# Patient Record
Sex: Male | Born: 1965
Health system: Southern US, Community
[De-identification: ages and names within clinical notes are randomized; demographics above are authoritative.]

## PROBLEM LIST (undated history)

## (undated) DIAGNOSIS — I2699 Other pulmonary embolism without acute cor pulmonale: Secondary | ICD-10-CM

## (undated) DIAGNOSIS — D649 Anemia, unspecified: Secondary | ICD-10-CM

## (undated) DIAGNOSIS — C3490 Malignant neoplasm of unspecified part of unspecified bronchus or lung: Secondary | ICD-10-CM

## (undated) DIAGNOSIS — I509 Heart failure, unspecified: Secondary | ICD-10-CM

## (undated) DIAGNOSIS — C7889 Secondary malignant neoplasm of other digestive organs: Secondary | ICD-10-CM

## (undated) DIAGNOSIS — C801 Malignant (primary) neoplasm, unspecified: Secondary | ICD-10-CM

## (undated) DIAGNOSIS — K922 Gastrointestinal hemorrhage, unspecified: Secondary | ICD-10-CM

## (undated) DIAGNOSIS — I10 Essential (primary) hypertension: Secondary | ICD-10-CM

## (undated) DIAGNOSIS — E119 Type 2 diabetes mellitus without complications: Secondary | ICD-10-CM

## (undated) DIAGNOSIS — F419 Anxiety disorder, unspecified: Secondary | ICD-10-CM

## (undated) DIAGNOSIS — C7951 Secondary malignant neoplasm of bone: Secondary | ICD-10-CM

## (undated) HISTORY — DX: Gastrointestinal hemorrhage, unspecified: K92.2

## (undated) HISTORY — DX: Secondary malignant neoplasm of bone: C79.51

## (undated) HISTORY — PX: COLONOSCOPY W/ POLYPECTOMY: SHX1380

## (undated) HISTORY — PX: NEPHRECTOMY RADICAL: SUR878

## (undated) HISTORY — PX: HAND SURGERY: SHX662

---

## 2001-04-07 ENCOUNTER — Emergency Department (HOSPITAL_COMMUNITY): Admission: EM | Admit: 2001-04-07 | Discharge: 2001-04-07 | Payer: Self-pay | Admitting: *Deleted

## 2001-04-07 ENCOUNTER — Encounter: Payer: Self-pay | Admitting: *Deleted

## 2001-04-08 ENCOUNTER — Emergency Department (HOSPITAL_COMMUNITY): Admission: EM | Admit: 2001-04-08 | Discharge: 2001-04-08 | Payer: Self-pay | Admitting: *Deleted

## 2001-04-09 ENCOUNTER — Ambulatory Visit (HOSPITAL_COMMUNITY): Admission: RE | Admit: 2001-04-09 | Discharge: 2001-04-09 | Payer: Self-pay | Admitting: Orthopaedic Surgery

## 2001-04-10 ENCOUNTER — Encounter (HOSPITAL_COMMUNITY): Admission: RE | Admit: 2001-04-10 | Discharge: 2001-05-10 | Payer: Self-pay | Admitting: Orthopaedic Surgery

## 2001-04-10 ENCOUNTER — Ambulatory Visit (HOSPITAL_COMMUNITY): Admission: RE | Admit: 2001-04-10 | Discharge: 2001-04-10 | Payer: Self-pay | Admitting: Orthopaedic Surgery

## 2001-04-11 ENCOUNTER — Emergency Department (HOSPITAL_COMMUNITY): Admission: EM | Admit: 2001-04-11 | Discharge: 2001-04-11 | Payer: Self-pay | Admitting: Emergency Medicine

## 2001-04-11 ENCOUNTER — Encounter: Payer: Self-pay | Admitting: Emergency Medicine

## 2001-04-11 ENCOUNTER — Ambulatory Visit (HOSPITAL_COMMUNITY): Admission: RE | Admit: 2001-04-11 | Discharge: 2001-04-11 | Payer: Self-pay | Admitting: Orthopaedic Surgery

## 2001-04-13 ENCOUNTER — Ambulatory Visit (HOSPITAL_COMMUNITY): Admission: RE | Admit: 2001-04-13 | Discharge: 2001-04-13 | Payer: Self-pay | Admitting: Orthopaedic Surgery

## 2006-09-24 ENCOUNTER — Encounter: Admission: RE | Admit: 2006-09-24 | Discharge: 2006-11-03 | Payer: Self-pay | Admitting: Clinical

## 2007-12-23 DIAGNOSIS — C3490 Malignant neoplasm of unspecified part of unspecified bronchus or lung: Secondary | ICD-10-CM

## 2007-12-23 HISTORY — DX: Malignant neoplasm of unspecified part of unspecified bronchus or lung: C34.90

## 2008-06-30 ENCOUNTER — Emergency Department (HOSPITAL_COMMUNITY): Admission: EM | Admit: 2008-06-30 | Discharge: 2008-06-30 | Payer: Self-pay | Admitting: Emergency Medicine

## 2008-07-03 ENCOUNTER — Ambulatory Visit (HOSPITAL_COMMUNITY): Admission: RE | Admit: 2008-07-03 | Discharge: 2008-07-03 | Payer: Self-pay | Admitting: Urology

## 2008-08-07 ENCOUNTER — Inpatient Hospital Stay (HOSPITAL_COMMUNITY): Admission: RE | Admit: 2008-08-07 | Discharge: 2008-08-09 | Payer: Self-pay | Admitting: Urology

## 2008-08-07 ENCOUNTER — Encounter (INDEPENDENT_AMBULATORY_CARE_PROVIDER_SITE_OTHER): Payer: Self-pay | Admitting: Urology

## 2008-08-16 ENCOUNTER — Ambulatory Visit: Payer: Self-pay | Admitting: Oncology

## 2008-08-17 ENCOUNTER — Encounter: Payer: Self-pay | Admitting: Internal Medicine

## 2008-08-17 ENCOUNTER — Ambulatory Visit: Payer: Self-pay | Admitting: Thoracic Surgery (Cardiothoracic Vascular Surgery)

## 2008-09-18 ENCOUNTER — Ambulatory Visit: Payer: Self-pay | Admitting: Thoracic Surgery (Cardiothoracic Vascular Surgery)

## 2008-09-18 ENCOUNTER — Encounter
Admission: RE | Admit: 2008-09-18 | Discharge: 2008-09-18 | Payer: Self-pay | Admitting: Thoracic Surgery (Cardiothoracic Vascular Surgery)

## 2008-09-21 HISTORY — PX: LUNG REMOVAL, PARTIAL: SHX233

## 2008-09-26 ENCOUNTER — Encounter: Payer: Self-pay | Admitting: Thoracic Surgery (Cardiothoracic Vascular Surgery)

## 2008-09-26 ENCOUNTER — Inpatient Hospital Stay (HOSPITAL_COMMUNITY)
Admission: RE | Admit: 2008-09-26 | Discharge: 2008-09-30 | Payer: Self-pay | Admitting: Thoracic Surgery (Cardiothoracic Vascular Surgery)

## 2008-09-26 ENCOUNTER — Ambulatory Visit: Payer: Self-pay | Admitting: Thoracic Surgery (Cardiothoracic Vascular Surgery)

## 2008-10-04 ENCOUNTER — Ambulatory Visit: Payer: Self-pay | Admitting: Thoracic Surgery (Cardiothoracic Vascular Surgery)

## 2008-10-06 ENCOUNTER — Ambulatory Visit: Payer: Self-pay | Admitting: Thoracic Surgery (Cardiothoracic Vascular Surgery)

## 2008-10-18 ENCOUNTER — Ambulatory Visit: Payer: Self-pay | Admitting: Oncology

## 2008-10-20 LAB — CBC WITH DIFFERENTIAL/PLATELET
BASO%: 0.8 % (ref 0.0–2.0)
Basophils Absolute: 0.1 10*3/uL (ref 0.0–0.1)
EOS%: 7.8 % — ABNORMAL HIGH (ref 0.0–7.0)
Eosinophils Absolute: 0.9 10*3/uL — ABNORMAL HIGH (ref 0.0–0.5)
HCT: 38.3 % — ABNORMAL LOW (ref 38.7–49.9)
HGB: 13.1 g/dL (ref 13.0–17.1)
LYMPH%: 21.7 % (ref 14.0–48.0)
MCH: 31.4 pg (ref 28.0–33.4)
MCHC: 34.3 g/dL (ref 32.0–35.9)
MCV: 91.5 fL (ref 81.6–98.0)
MONO#: 0.7 10*3/uL (ref 0.1–0.9)
MONO%: 6.6 % (ref 0.0–13.0)
NEUT#: 7.1 10*3/uL — ABNORMAL HIGH (ref 1.5–6.5)
NEUT%: 63.1 % (ref 40.0–75.0)
Platelets: 331 10*3/uL (ref 145–400)
RBC: 4.19 10*6/uL — ABNORMAL LOW (ref 4.20–5.71)
RDW: 14 % (ref 11.2–14.6)
WBC: 11.2 10*3/uL — ABNORMAL HIGH (ref 4.0–10.0)
lymph#: 2.4 10*3/uL (ref 0.9–3.3)

## 2008-10-20 LAB — COMPREHENSIVE METABOLIC PANEL
ALT: 56 U/L — ABNORMAL HIGH (ref 0–53)
AST: 31 U/L (ref 0–37)
Albumin: 4.3 g/dL (ref 3.5–5.2)
Alkaline Phosphatase: 126 U/L — ABNORMAL HIGH (ref 39–117)
BUN: 16 mg/dL (ref 6–23)
CO2: 27 mEq/L (ref 19–32)
Calcium: 9.8 mg/dL (ref 8.4–10.5)
Chloride: 102 mEq/L (ref 96–112)
Creatinine, Ser: 1.59 mg/dL — ABNORMAL HIGH (ref 0.40–1.50)
Glucose, Bld: 76 mg/dL (ref 70–99)
Potassium: 4.5 mEq/L (ref 3.5–5.3)
Sodium: 139 mEq/L (ref 135–145)
Total Bilirubin: 0.5 mg/dL (ref 0.3–1.2)
Total Protein: 8.2 g/dL (ref 6.0–8.3)

## 2008-10-20 LAB — LACTATE DEHYDROGENASE: LDH: 154 U/L (ref 94–250)

## 2008-10-23 ENCOUNTER — Encounter
Admission: RE | Admit: 2008-10-23 | Discharge: 2008-10-23 | Payer: Self-pay | Admitting: Thoracic Surgery (Cardiothoracic Vascular Surgery)

## 2008-10-23 ENCOUNTER — Ambulatory Visit: Payer: Self-pay | Admitting: Thoracic Surgery (Cardiothoracic Vascular Surgery)

## 2008-12-12 ENCOUNTER — Ambulatory Visit: Payer: Self-pay | Admitting: Oncology

## 2008-12-18 ENCOUNTER — Ambulatory Visit (HOSPITAL_COMMUNITY): Admission: RE | Admit: 2008-12-18 | Discharge: 2008-12-18 | Payer: Self-pay | Admitting: Oncology

## 2008-12-25 ENCOUNTER — Ambulatory Visit: Payer: Self-pay | Admitting: Thoracic Surgery (Cardiothoracic Vascular Surgery)

## 2009-05-02 ENCOUNTER — Ambulatory Visit: Payer: Self-pay | Admitting: Oncology

## 2009-05-11 ENCOUNTER — Ambulatory Visit (HOSPITAL_COMMUNITY): Admission: RE | Admit: 2009-05-11 | Discharge: 2009-05-11 | Payer: Self-pay | Admitting: Oncology

## 2009-05-11 LAB — COMPREHENSIVE METABOLIC PANEL
ALT: 37 U/L (ref 0–53)
AST: 24 U/L (ref 0–37)
Albumin: 4.5 g/dL (ref 3.5–5.2)
Alkaline Phosphatase: 109 U/L (ref 39–117)
BUN: 17 mg/dL (ref 6–23)
CO2: 21 mEq/L (ref 19–32)
Calcium: 9.3 mg/dL (ref 8.4–10.5)
Chloride: 106 mEq/L (ref 96–112)
Creatinine, Ser: 1.33 mg/dL (ref 0.40–1.50)
Glucose, Bld: 87 mg/dL (ref 70–99)
Potassium: 4.5 mEq/L (ref 3.5–5.3)
Sodium: 139 mEq/L (ref 135–145)
Total Bilirubin: 0.4 mg/dL (ref 0.3–1.2)
Total Protein: 8.1 g/dL (ref 6.0–8.3)

## 2009-05-11 LAB — CBC WITH DIFFERENTIAL/PLATELET
BASO%: 0.5 % (ref 0.0–2.0)
Basophils Absolute: 0 10*3/uL (ref 0.0–0.1)
EOS%: 2.1 % (ref 0.0–7.0)
Eosinophils Absolute: 0.2 10*3/uL (ref 0.0–0.5)
HCT: 41.9 % (ref 38.4–49.9)
HGB: 14.3 g/dL (ref 13.0–17.1)
LYMPH%: 25.1 % (ref 14.0–49.0)
MCH: 31.5 pg (ref 27.2–33.4)
MCHC: 34.1 g/dL (ref 32.0–36.0)
MCV: 92.2 fL (ref 79.3–98.0)
MONO#: 0.4 10*3/uL (ref 0.1–0.9)
MONO%: 5 % (ref 0.0–14.0)
NEUT#: 5.2 10*3/uL (ref 1.5–6.5)
NEUT%: 67.3 % (ref 39.0–75.0)
Platelets: 232 10*3/uL (ref 140–400)
RBC: 4.54 10*6/uL (ref 4.20–5.82)
RDW: 14 % (ref 11.0–14.6)
WBC: 7.8 10*3/uL (ref 4.0–10.3)
lymph#: 1.9 10*3/uL (ref 0.9–3.3)

## 2009-05-11 LAB — LACTATE DEHYDROGENASE: LDH: 160 U/L (ref 94–250)

## 2009-10-03 ENCOUNTER — Ambulatory Visit: Payer: Self-pay | Admitting: Oncology

## 2009-10-05 LAB — CBC WITH DIFFERENTIAL/PLATELET
BASO%: 0.5 % (ref 0.0–2.0)
Basophils Absolute: 0 10*3/uL (ref 0.0–0.1)
EOS%: 1.8 % (ref 0.0–7.0)
Eosinophils Absolute: 0.1 10*3/uL (ref 0.0–0.5)
HCT: 41.1 % (ref 38.4–49.9)
HGB: 14.2 g/dL (ref 13.0–17.1)
LYMPH%: 25.2 % (ref 14.0–49.0)
MCH: 32.1 pg (ref 27.2–33.4)
MCHC: 34.6 g/dL (ref 32.0–36.0)
MCV: 92.6 fL (ref 79.3–98.0)
MONO#: 0.5 10*3/uL (ref 0.1–0.9)
MONO%: 6.8 % (ref 0.0–14.0)
NEUT#: 5.2 10*3/uL (ref 1.5–6.5)
NEUT%: 65.7 % (ref 39.0–75.0)
Platelets: 224 10*3/uL (ref 140–400)
RBC: 4.44 10*6/uL (ref 4.20–5.82)
RDW: 13.1 % (ref 11.0–14.6)
WBC: 7.9 10*3/uL (ref 4.0–10.3)
lymph#: 2 10*3/uL (ref 0.9–3.3)

## 2009-10-05 LAB — COMPREHENSIVE METABOLIC PANEL
ALT: 41 U/L (ref 0–53)
AST: 28 U/L (ref 0–37)
Albumin: 4.6 g/dL (ref 3.5–5.2)
Alkaline Phosphatase: 93 U/L (ref 39–117)
BUN: 11 mg/dL (ref 6–23)
CO2: 25 mEq/L (ref 19–32)
Calcium: 9 mg/dL (ref 8.4–10.5)
Chloride: 105 mEq/L (ref 96–112)
Creatinine, Ser: 1.23 mg/dL (ref 0.40–1.50)
Glucose, Bld: 113 mg/dL — ABNORMAL HIGH (ref 70–99)
Potassium: 4.4 mEq/L (ref 3.5–5.3)
Sodium: 138 mEq/L (ref 135–145)
Total Bilirubin: 0.5 mg/dL (ref 0.3–1.2)
Total Protein: 7.8 g/dL (ref 6.0–8.3)

## 2010-01-02 ENCOUNTER — Ambulatory Visit: Payer: Self-pay | Admitting: Oncology

## 2010-01-04 ENCOUNTER — Ambulatory Visit (HOSPITAL_COMMUNITY): Admission: RE | Admit: 2010-01-04 | Discharge: 2010-01-04 | Payer: Self-pay | Admitting: Oncology

## 2010-01-04 LAB — COMPREHENSIVE METABOLIC PANEL
ALT: 58 U/L — ABNORMAL HIGH (ref 0–53)
AST: 33 U/L (ref 0–37)
Albumin: 4.4 g/dL (ref 3.5–5.2)
Alkaline Phosphatase: 91 U/L (ref 39–117)
BUN: 16 mg/dL (ref 6–23)
CO2: 28 mEq/L (ref 19–32)
Calcium: 9.4 mg/dL (ref 8.4–10.5)
Chloride: 103 mEq/L (ref 96–112)
Creatinine, Ser: 1.47 mg/dL (ref 0.40–1.50)
Glucose, Bld: 89 mg/dL (ref 70–99)
Potassium: 4.3 mEq/L (ref 3.5–5.3)
Sodium: 136 mEq/L (ref 135–145)
Total Bilirubin: 0.7 mg/dL (ref 0.3–1.2)
Total Protein: 8.4 g/dL — ABNORMAL HIGH (ref 6.0–8.3)

## 2010-01-04 LAB — CBC WITH DIFFERENTIAL/PLATELET
BASO%: 0.8 % (ref 0.0–2.0)
Basophils Absolute: 0.1 10*3/uL (ref 0.0–0.1)
EOS%: 2.6 % (ref 0.0–7.0)
Eosinophils Absolute: 0.2 10*3/uL (ref 0.0–0.5)
HCT: 45 % (ref 38.4–49.9)
HGB: 15 g/dL (ref 13.0–17.1)
LYMPH%: 28 % (ref 14.0–49.0)
MCH: 31.5 pg (ref 27.2–33.4)
MCHC: 33.3 g/dL (ref 32.0–36.0)
MCV: 94.5 fL (ref 79.3–98.0)
MONO#: 0.6 10*3/uL (ref 0.1–0.9)
MONO%: 6.5 % (ref 0.0–14.0)
NEUT#: 5.2 10*3/uL (ref 1.5–6.5)
NEUT%: 62.1 % (ref 39.0–75.0)
Platelets: 240 10*3/uL (ref 140–400)
RBC: 4.76 10*6/uL (ref 4.20–5.82)
RDW: 13.1 % (ref 11.0–14.6)
WBC: 8.4 10*3/uL (ref 4.0–10.3)
lymph#: 2.4 10*3/uL (ref 0.9–3.3)

## 2010-01-04 LAB — LACTATE DEHYDROGENASE: LDH: 133 U/L (ref 94–250)

## 2010-03-29 ENCOUNTER — Ambulatory Visit: Payer: Self-pay | Admitting: Oncology

## 2010-03-29 ENCOUNTER — Ambulatory Visit (HOSPITAL_COMMUNITY): Admission: RE | Admit: 2010-03-29 | Discharge: 2010-03-29 | Payer: Self-pay | Admitting: Oncology

## 2010-03-29 LAB — CBC WITH DIFFERENTIAL/PLATELET
BASO%: 0.3 % (ref 0.0–2.0)
Basophils Absolute: 0 10*3/uL (ref 0.0–0.1)
EOS%: 2.6 % (ref 0.0–7.0)
Eosinophils Absolute: 0.2 10*3/uL (ref 0.0–0.5)
HCT: 44.4 % (ref 38.4–49.9)
HGB: 15.4 g/dL (ref 13.0–17.1)
LYMPH%: 26.8 % (ref 14.0–49.0)
MCH: 32.6 pg (ref 27.2–33.4)
MCHC: 34.8 g/dL (ref 32.0–36.0)
MCV: 93.9 fL (ref 79.3–98.0)
MONO#: 0.6 10*3/uL (ref 0.1–0.9)
MONO%: 7.1 % (ref 0.0–14.0)
NEUT#: 5.2 10*3/uL (ref 1.5–6.5)
NEUT%: 63.2 % (ref 39.0–75.0)
Platelets: 227 10*3/uL (ref 140–400)
RBC: 4.73 10*6/uL (ref 4.20–5.82)
RDW: 13.2 % (ref 11.0–14.6)
WBC: 8.2 10*3/uL (ref 4.0–10.3)
lymph#: 2.2 10*3/uL (ref 0.9–3.3)

## 2010-03-29 LAB — COMPREHENSIVE METABOLIC PANEL
ALT: 73 U/L — ABNORMAL HIGH (ref 0–53)
AST: 42 U/L — ABNORMAL HIGH (ref 0–37)
Albumin: 4.2 g/dL (ref 3.5–5.2)
Alkaline Phosphatase: 96 U/L (ref 39–117)
BUN: 15 mg/dL (ref 6–23)
CO2: 29 mEq/L (ref 19–32)
Calcium: 9.5 mg/dL (ref 8.4–10.5)
Chloride: 103 mEq/L (ref 96–112)
Creatinine, Ser: 1.37 mg/dL (ref 0.40–1.50)
Glucose, Bld: 88 mg/dL (ref 70–99)
Potassium: 4.3 mEq/L (ref 3.5–5.3)
Sodium: 137 mEq/L (ref 135–145)
Total Bilirubin: 0.8 mg/dL (ref 0.3–1.2)
Total Protein: 8.3 g/dL (ref 6.0–8.3)

## 2010-03-29 LAB — LACTATE DEHYDROGENASE: LDH: 137 U/L (ref 94–250)

## 2010-04-16 ENCOUNTER — Ambulatory Visit: Admission: RE | Admit: 2010-04-16 | Discharge: 2010-04-20 | Payer: Self-pay | Admitting: Radiation Oncology

## 2010-05-01 ENCOUNTER — Ambulatory Visit: Admission: RE | Admit: 2010-05-01 | Discharge: 2010-05-21 | Payer: Self-pay | Admitting: Radiation Oncology

## 2010-05-04 IMAGING — CT CT ABDOMEN W/O CM
2 of 4 series · 17 of 46 positions shown, 19 images · non-contrast
Comparison: Available

CT ABDOMEN

Addendum Begins

Findings conveyed to Dr. Piet Hein Damms on 06/30/2008 at to 2025
hours
Addendum Ends
CLINICAL DATA: Blood urine and stool
CT ABDOMEN AND PELVIS WITHOUT CONTRAST
TECHNIQUE: Multidetector CT imaging of the abdomen and pelvis was
performed following the standard
protocol without intravenous contrast.

[Series 2: 220 stone 5.0 b40f st · axial · 0.74mm/px · z∈[-516,-136]mm · 14 of 84 slices shown, 16 images]
[im 4/84  soft-tissue]
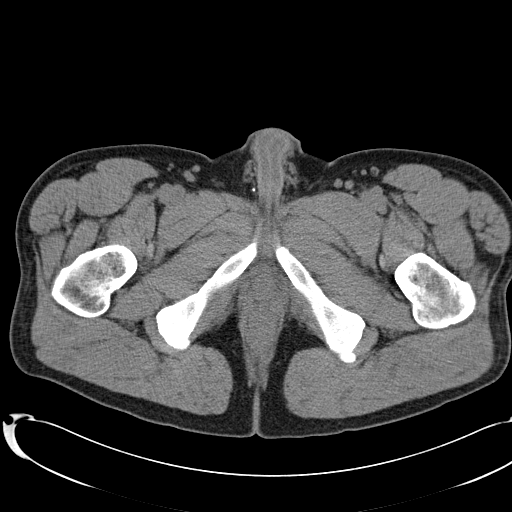
[im 4/84  bone]
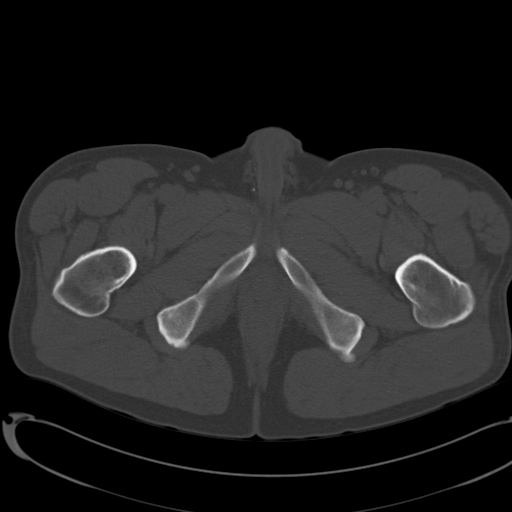
[im 10/84  soft-tissue]
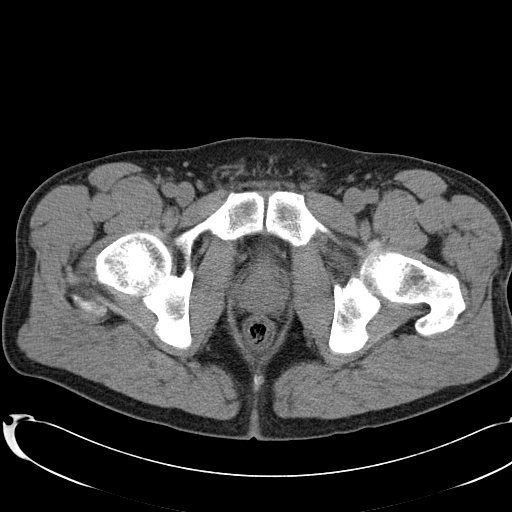
[im 16/84  soft-tissue]
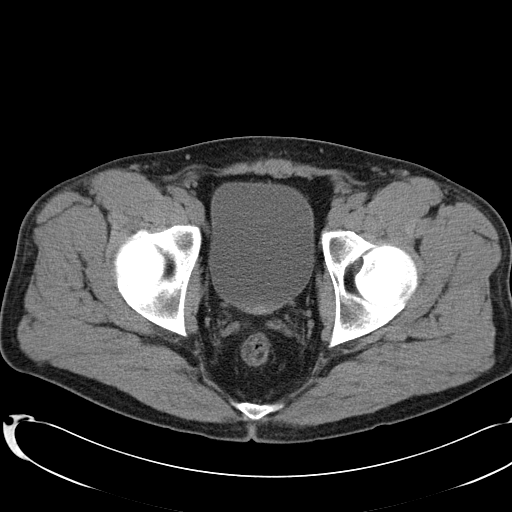
[im 23/84  soft-tissue]
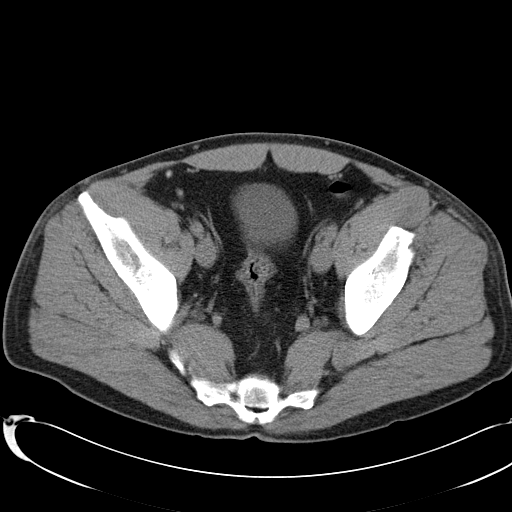
[im 29/84  soft-tissue]
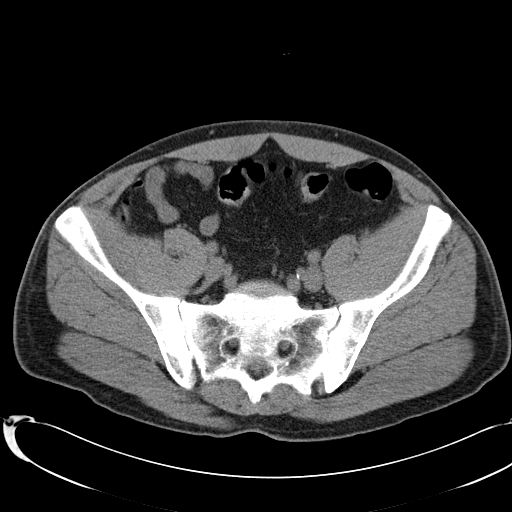
[im 32/84  soft-tissue]
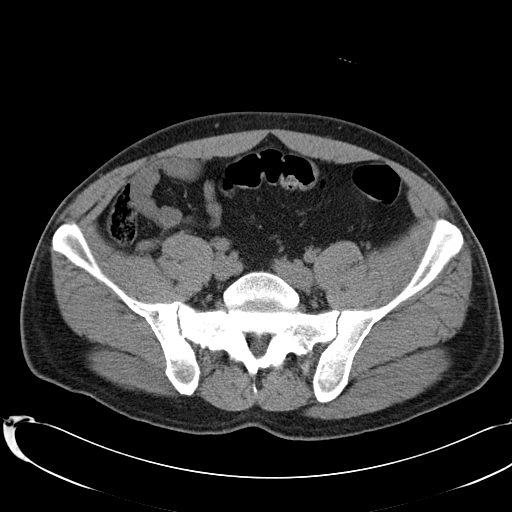
[im 39/84  soft-tissue]
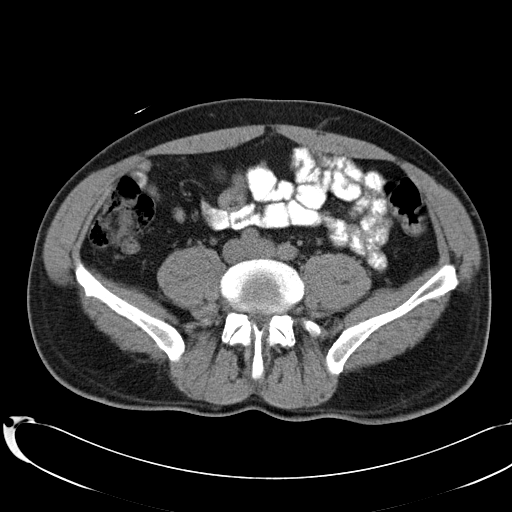
[im 45/84  soft-tissue]
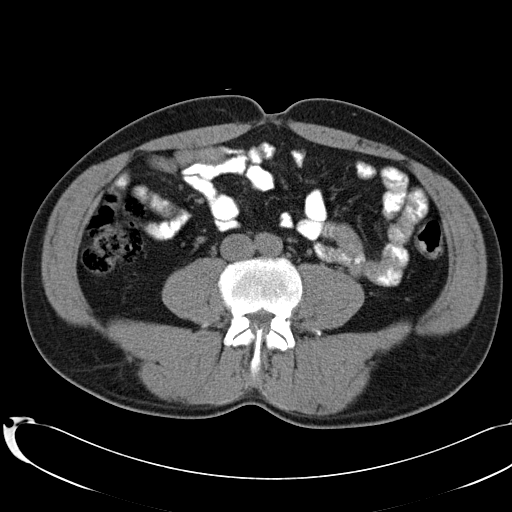
[im 52/84  soft-tissue]
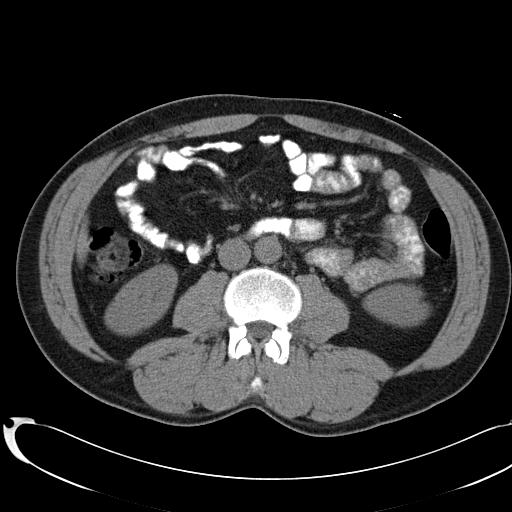
[im 52/84  bone]
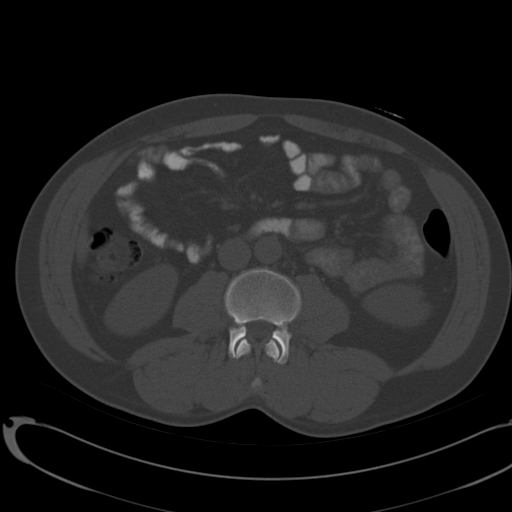
[im 55/84  soft-tissue]
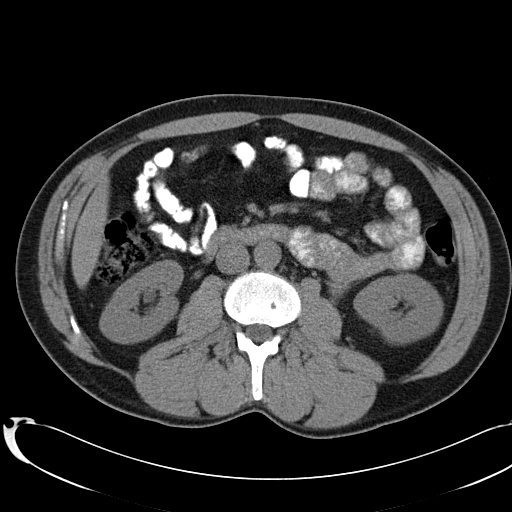
[im 61/84  soft-tissue]
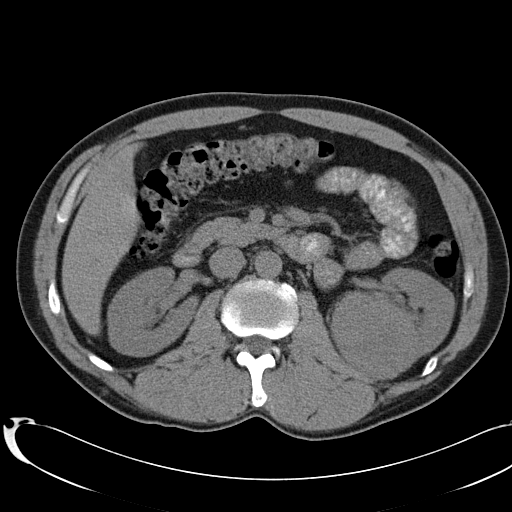
[im 68/84  soft-tissue]
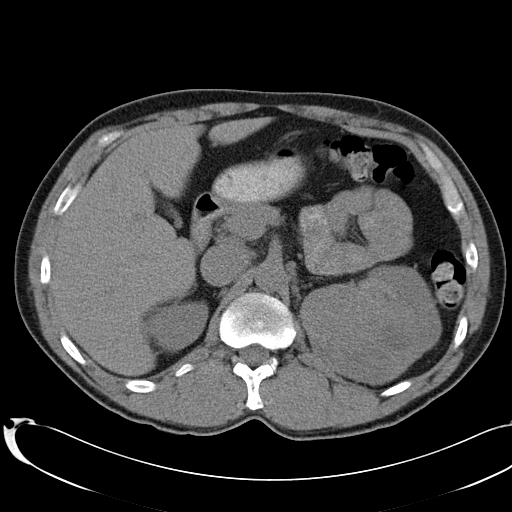
[im 74/84  soft-tissue]
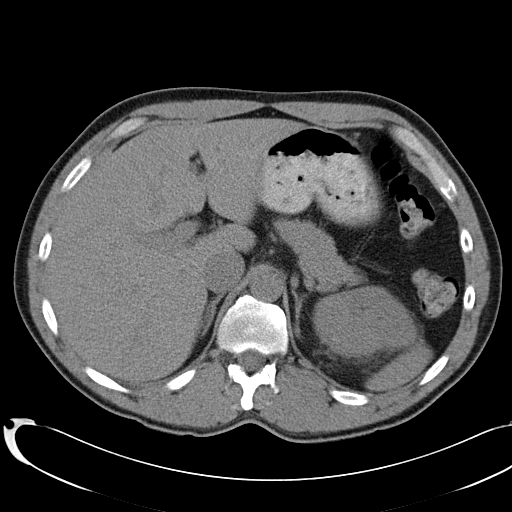
[im 80/84  soft-tissue]
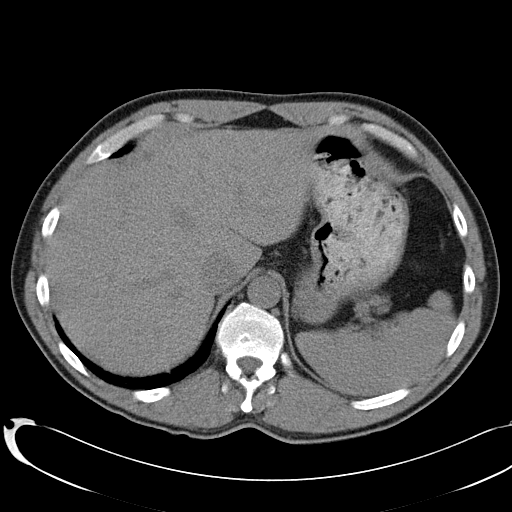

[Series 602: <mpr thick range> · coronal · 0.85mm/px · 3 of 79 slices shown]
[im 27/79  soft-tissue]
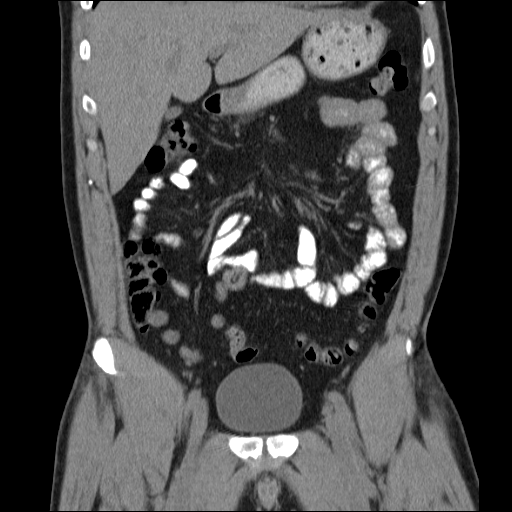
[im 35/79  soft-tissue]
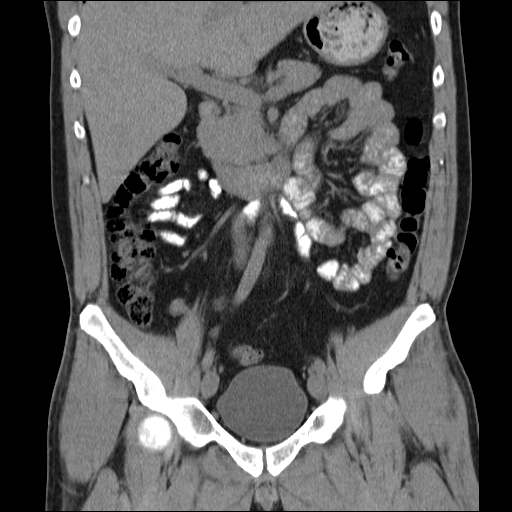
[im 44/79  soft-tissue]
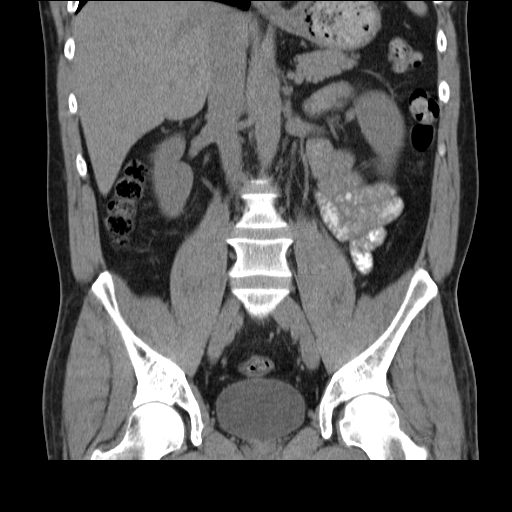

[17 of 46 positions shown; findings below may reference images not displayed]

FINDINGS: Mild bibasilar atelectasis.  Noncontrast images of the
abdomen demonstrate no focal hepatic lesion.  The gallbladder is
collapsed.  The pancreas appears normal.  The spleen is partially
imaged but appears normal.  The adrenal glands are normal.  The
right kidney is normal.

There is a large soft tissue mass extending from the upper pole and
mid pole of the left kidney which measures 9.4 x 6.2 x 10.0 cm.
Lesion highly suspicious for renal cell carcinoma.  This mass is
does not appear to extend beyond to the renal capsule.  Although it
does obliterate the posterior pararenal space due to its size.
Vascular involvement cannot be readily assessed.

Abdominal aorta is normal in caliber.  No evidence of
retroperitoneal or periportal lymphadenopathy. The stomach, small
bowel, and colon appear normal.  The appendix is not visualized.
IMPRESSION: 1. Large 10 cm mass extending from the mid pole of the left  kidney
as described above is highly suspicious for renal cell carcinoma.
Recommend urology consult.

CT PELVIS
FINDINGS: The bladder, seminal vesicles, and prostate appear
normal.  The rectum and sigmoid colon appear normal.  No evidence
of pelvic lymphadenopathy.
IMPRESSION: 1..  No acute abdominal process.

  Please see above for large left renal mass.

## 2010-05-07 IMAGING — CR DG CHEST 2V
3 series · 3 of 3 positions shown · non-contrast
Comparison: CT abdomen and pelvis 06/30/2008

CLINICAL DATA: Right lower lobe mass.

CHEST - 2 VIEW

[w chest pa]
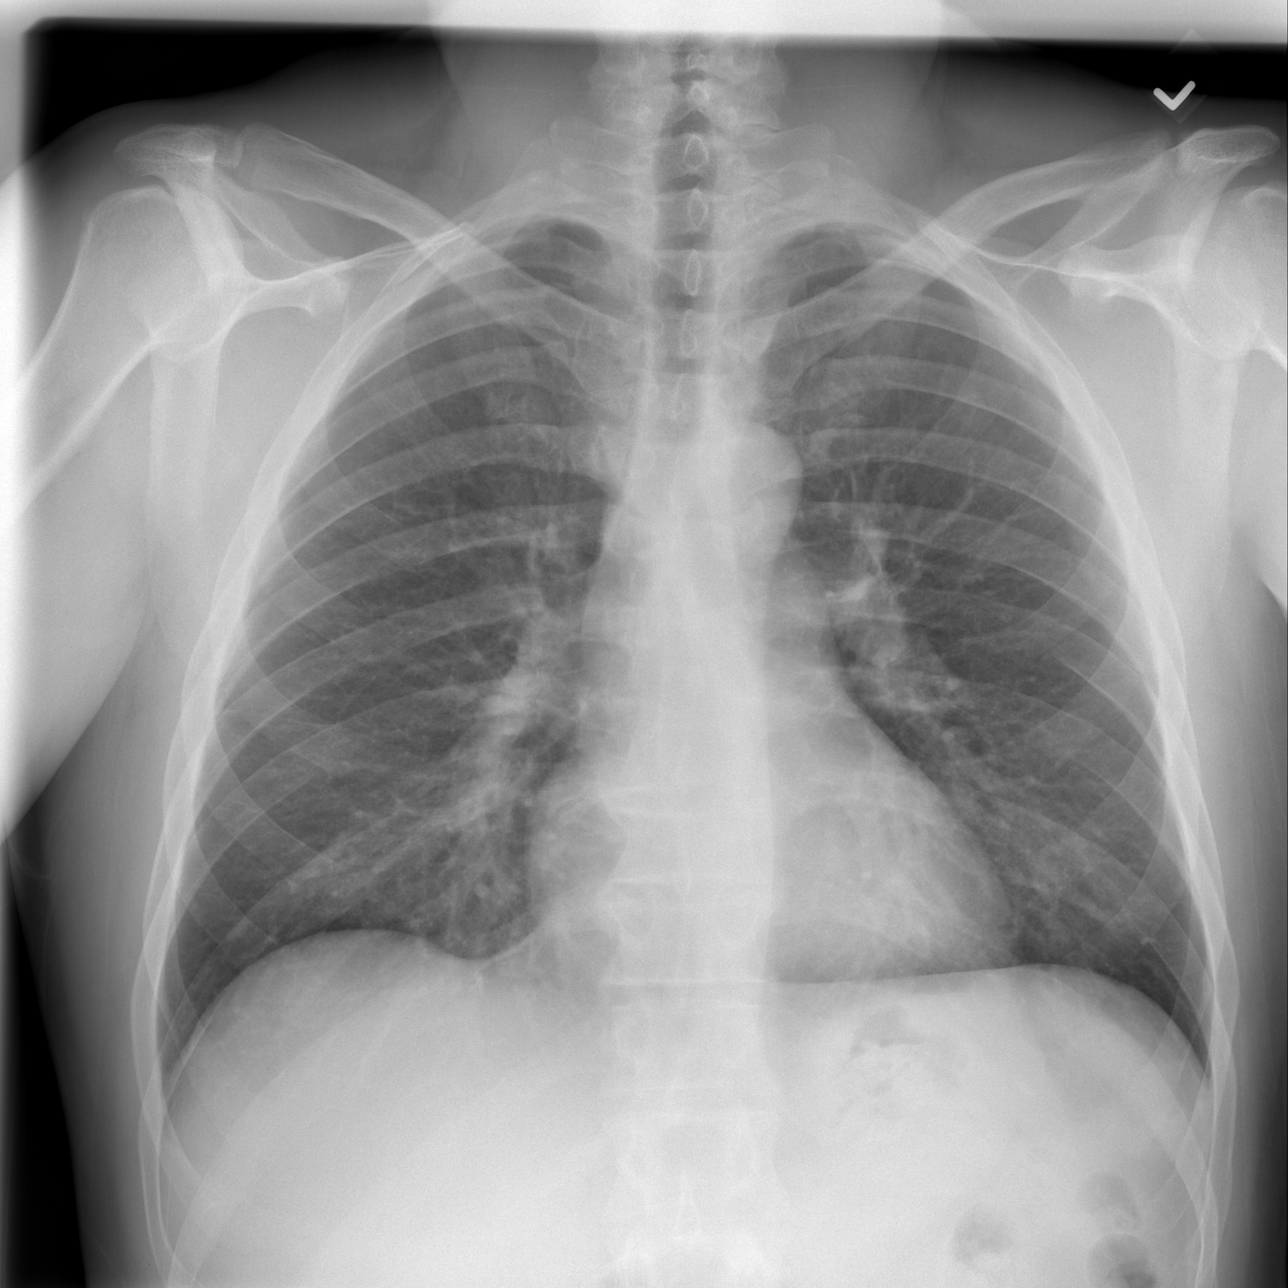

[w chest lat (1 of 2)]
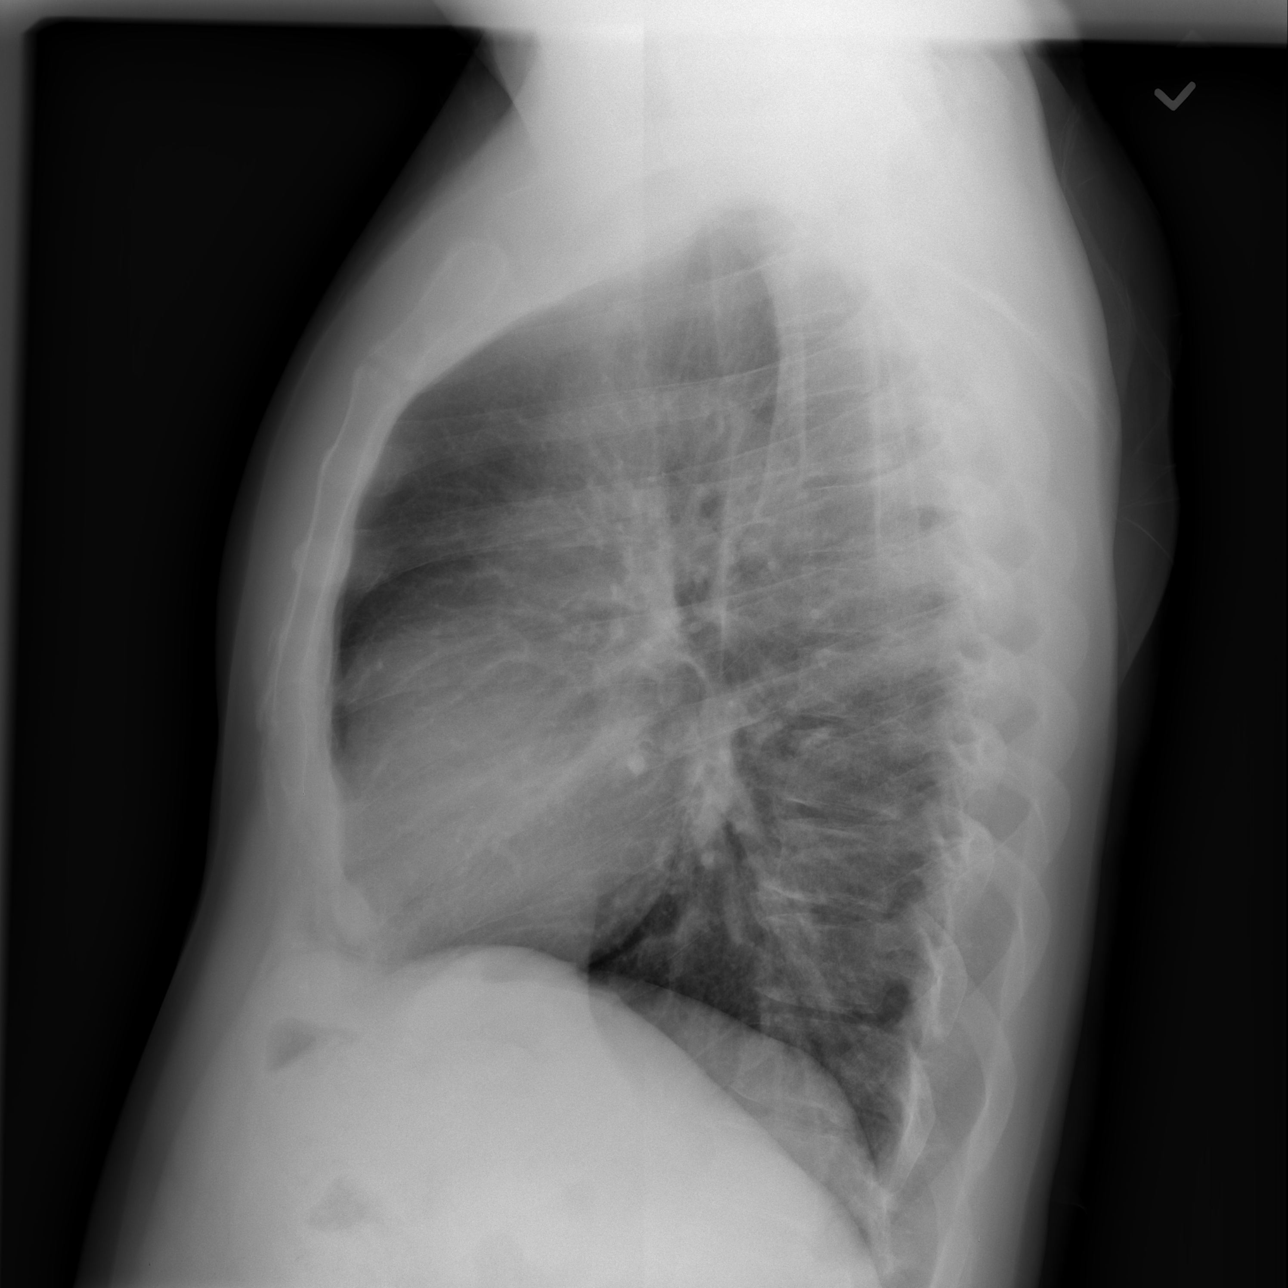

[w chest lat (2 of 2)]
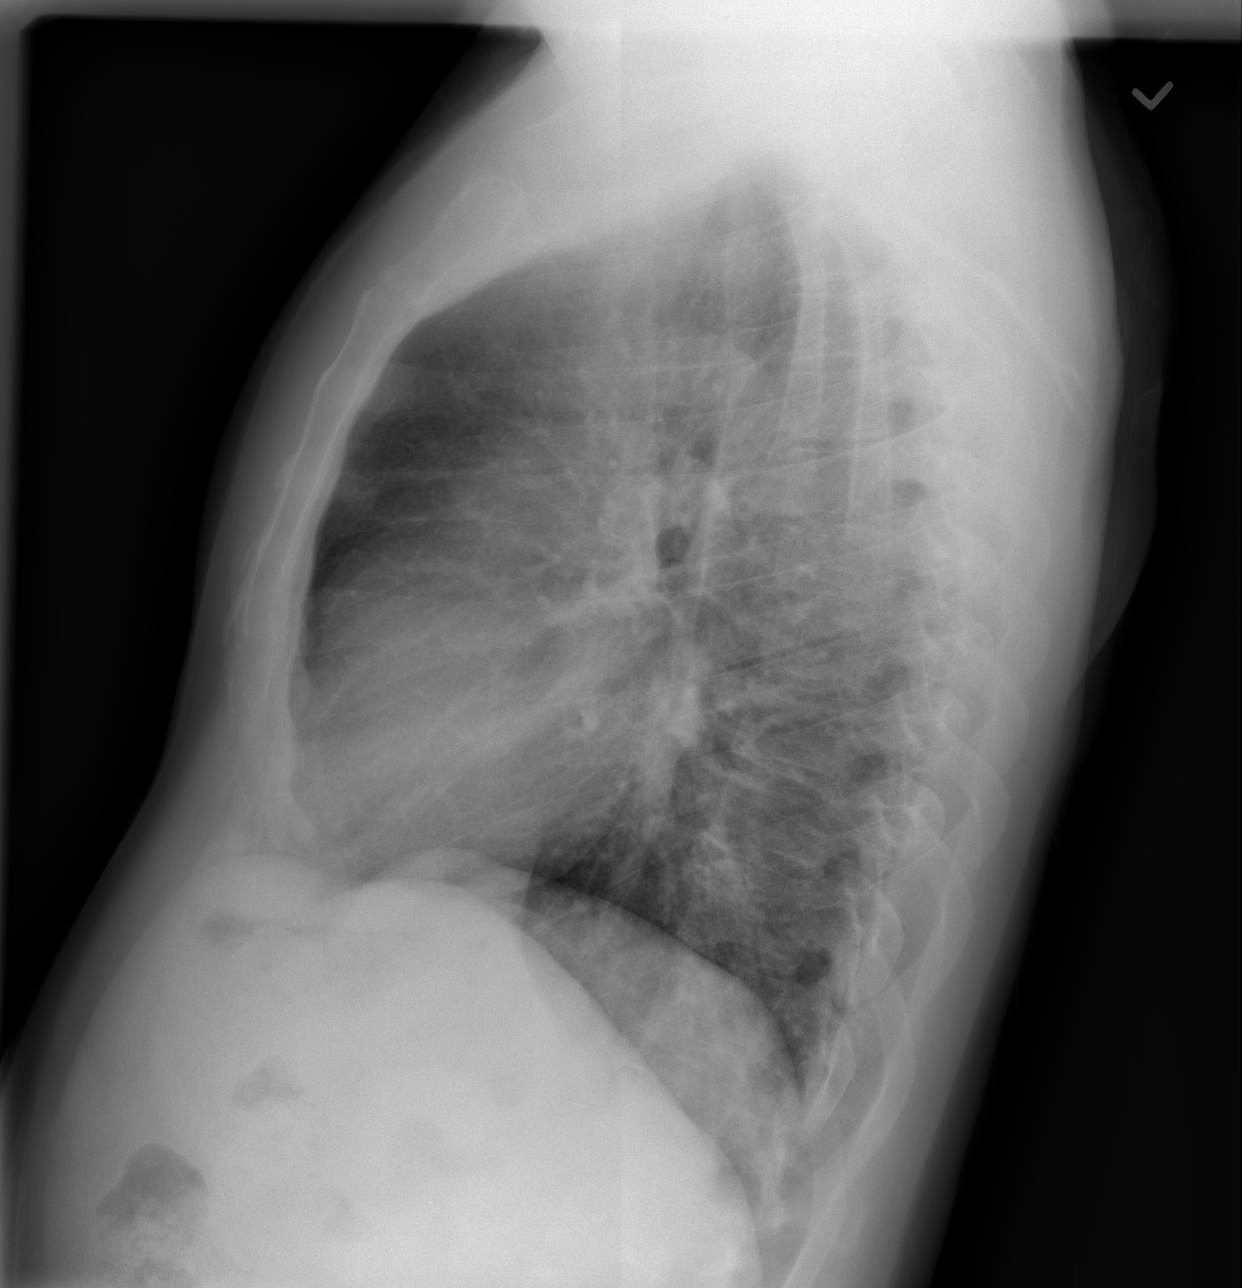

[3 of 3 positions shown; findings below may reference images not displayed]

FINDINGS: The cardiopericardial silhouette mediastinal contours
appear within normal limits.  The trachea is midline.  On the
frontal view, there is no definite right lower lobe mass
identified, as stated in the clinical indication.  On the lateral
view, increased density projected over the lower thoracic spine,
which could represent a lower lobe mass.  In this patient with a
large left renal mass, chest CT is recommended to assess for
pulmonary metastasis.
IMPRESSION: 1.  Area over the thoracic spine on the lateral view suspicious for
pulmonary mass lesion.  This was not seen on the prior abdominal CT
due to field of view and chest CT is recommended to assess for
pulmonary metastatic disease.  This should be performed with
infusion, if there are no contraindications.

## 2010-07-08 ENCOUNTER — Ambulatory Visit (HOSPITAL_COMMUNITY): Admission: RE | Admit: 2010-07-08 | Discharge: 2010-07-08 | Payer: Self-pay | Admitting: Radiation Oncology

## 2010-07-23 IMAGING — CT CT ABDOMEN W/ CM
4 of 5 series · 14 of 46 positions shown, 19 images · IV contrast (READICAT/WATER & [ID] OMNI 300)
Comparison: 06/30/2008

CT CHEST

CLINICAL DATA: Follow-up pulmonary nodules.

CT CHEST AND ABDOMEN WITH CONTRAST
TECHNIQUE: Multidetector CT imaging of the chest and abdomen was
performed following the standard protocol during bolus
administration of intravenous contrast.
Contrast: 125 ml Kmnipaque-IWW

[Series 3: chest/abd/pelvis · axial · 0.79mm/px · z∈[-451,+9]mm · 8 of 106 slices shown]
[im 7/106  soft-tissue]
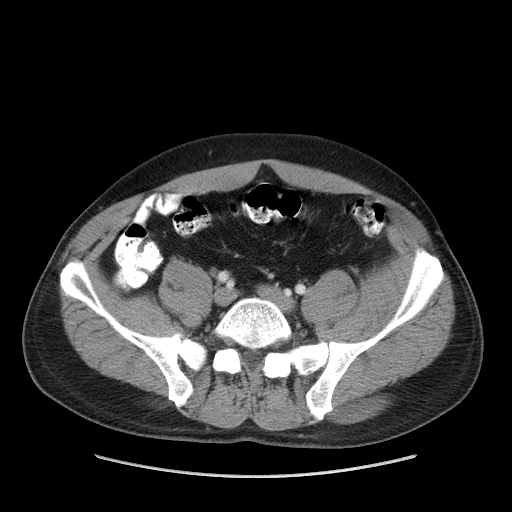
[im 20/106  soft-tissue]
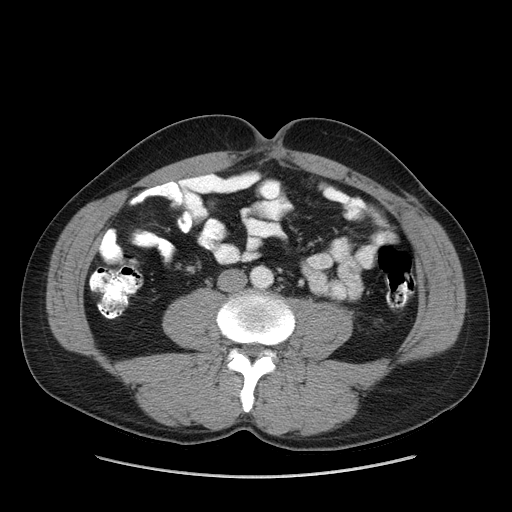
[im 33/106  soft-tissue]
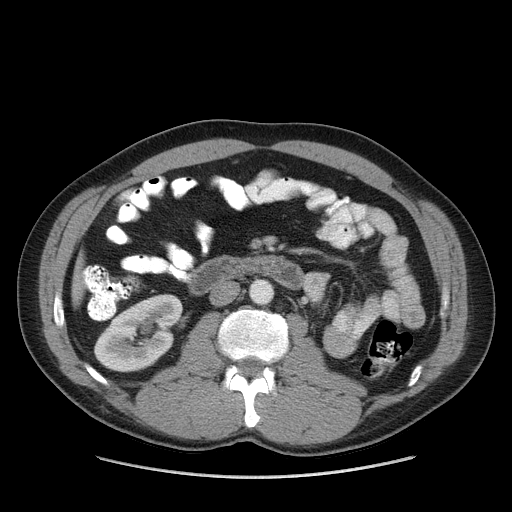
[im 46/106  soft-tissue]
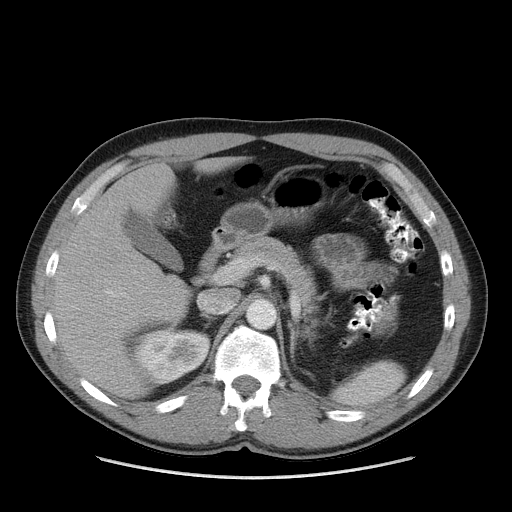
[im 60/106  soft-tissue]
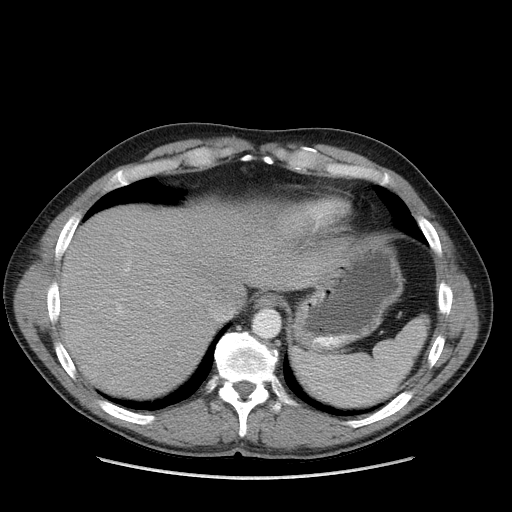
[im 73/106  soft-tissue]
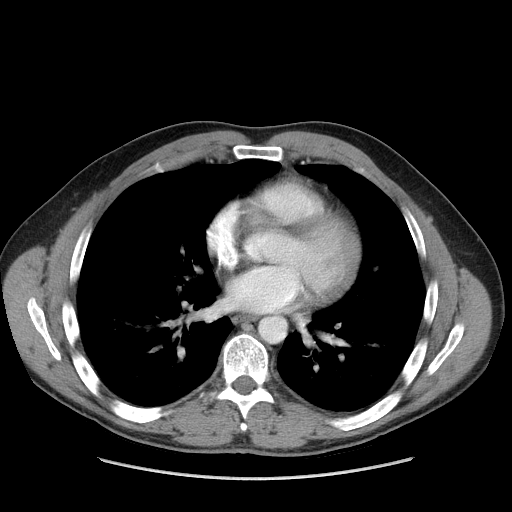
[im 86/106  soft-tissue]
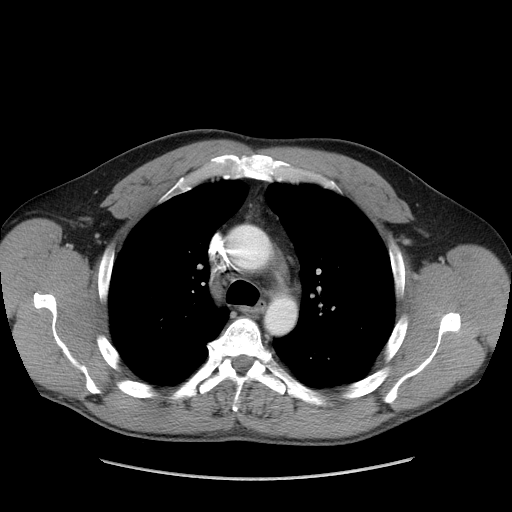
[im 99/106  soft-tissue]
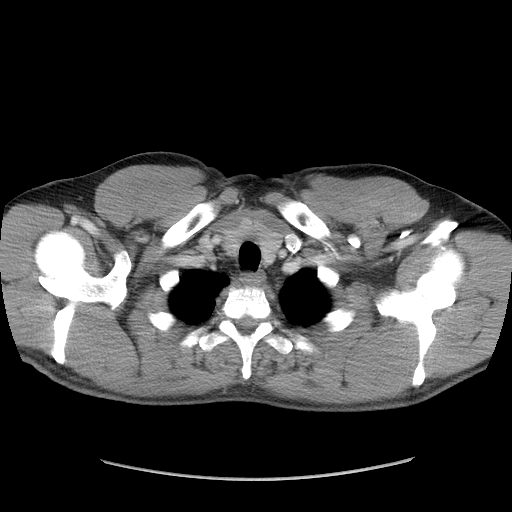

[Series 5: renal delay · axial · delayed · 0.78mm/px · z∈[-324,-279]mm · 2 of 28 slices shown, 5 images]
[im 10/28  soft-tissue]
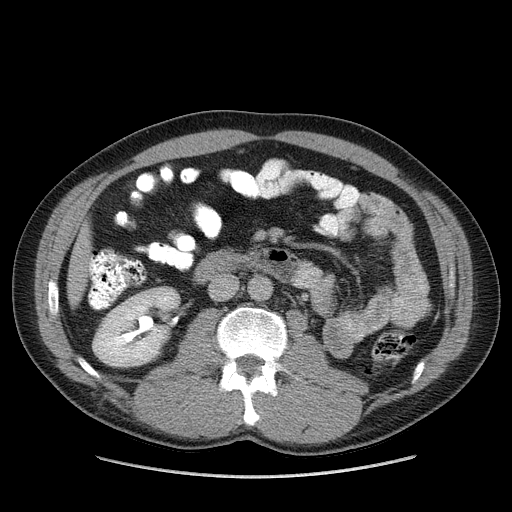
[im 10/28  lung]
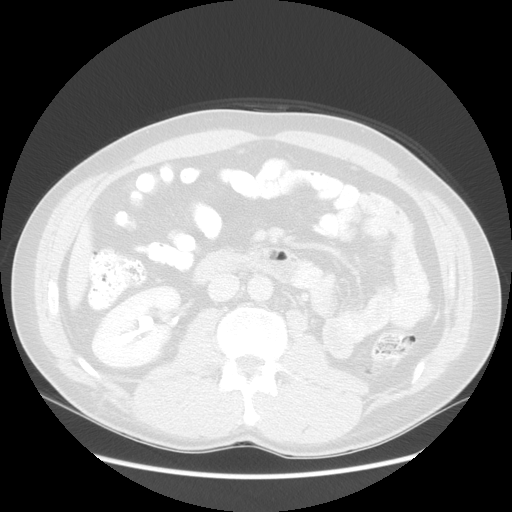
[im 10/28  bone]
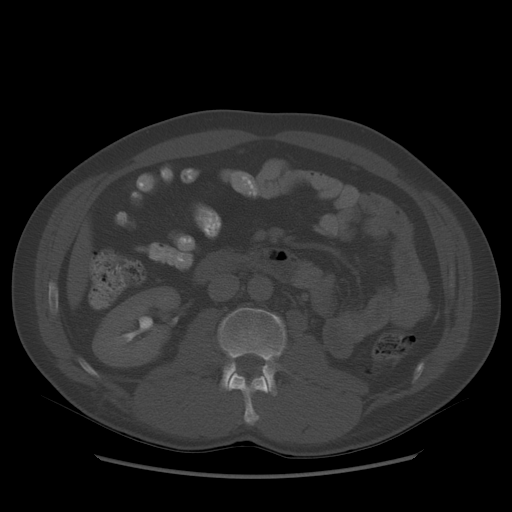
[im 19/28  soft-tissue]
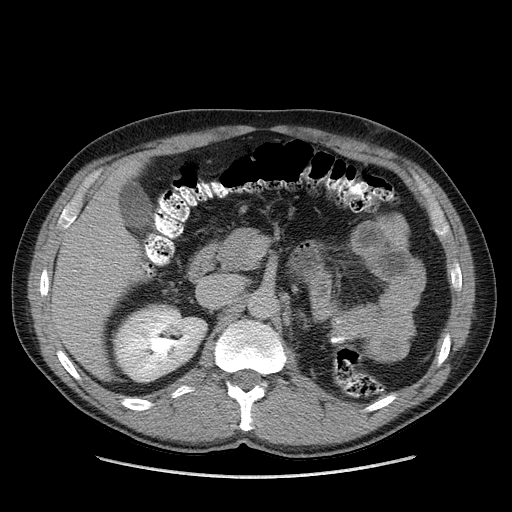
[im 19/28  lung]
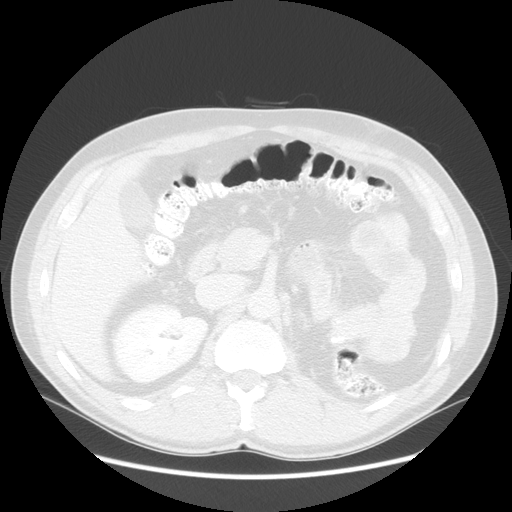

[Series 601: coronal body · coronal · 1.03mm/px · 1 of 114 slices shown, 2 images]
[im 38/114  soft-tissue]
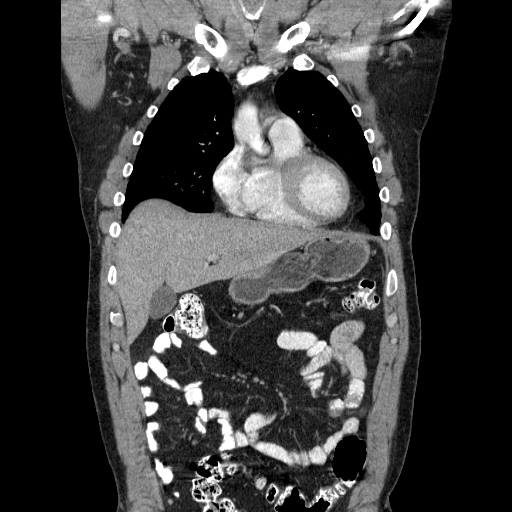
[im 38/114  bone]
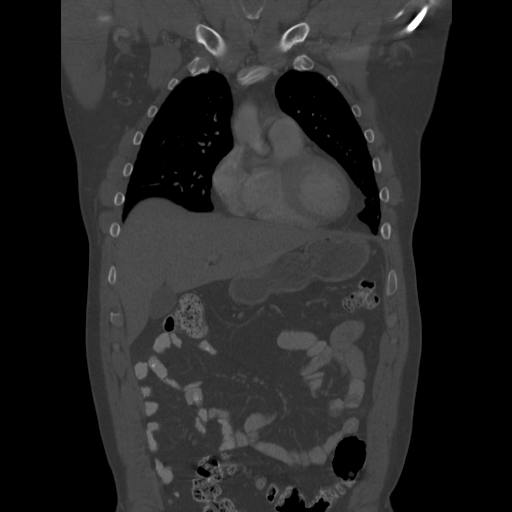

[Series 602: sagittal body · sagittal · 1.03mm/px · 3 of 164 slices shown, 4 images]
[im 55/164  soft-tissue]
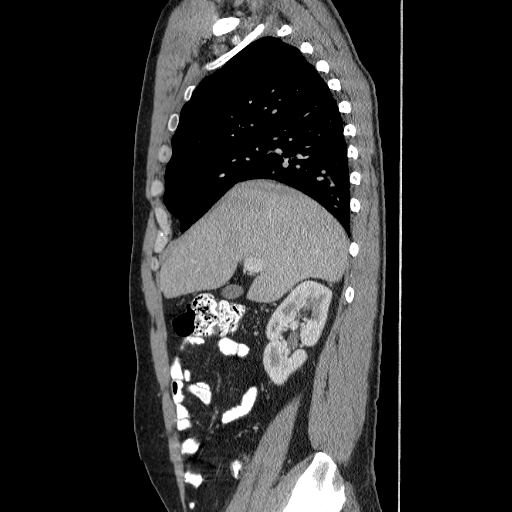
[im 73/164  soft-tissue]
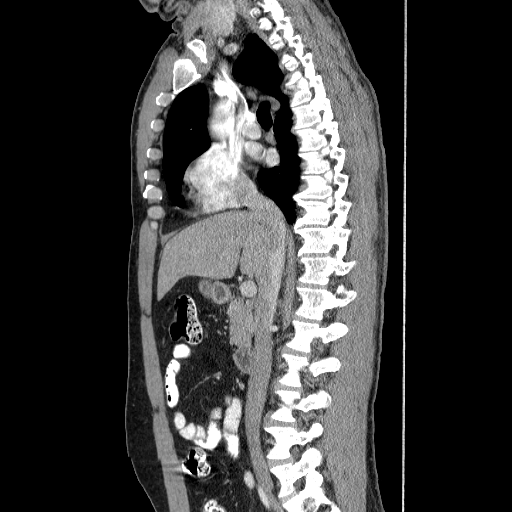
[im 73/164  bone]
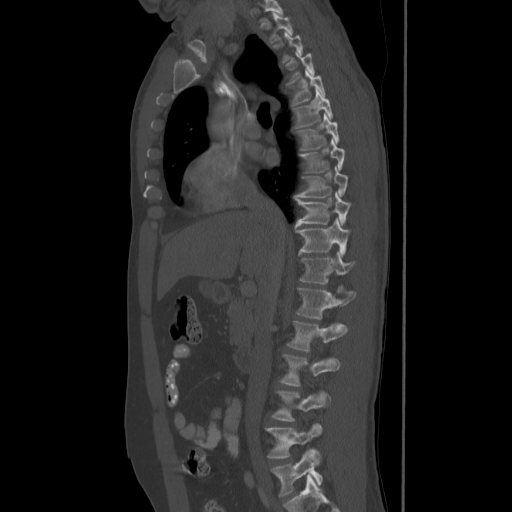
[im 91/164  soft-tissue]
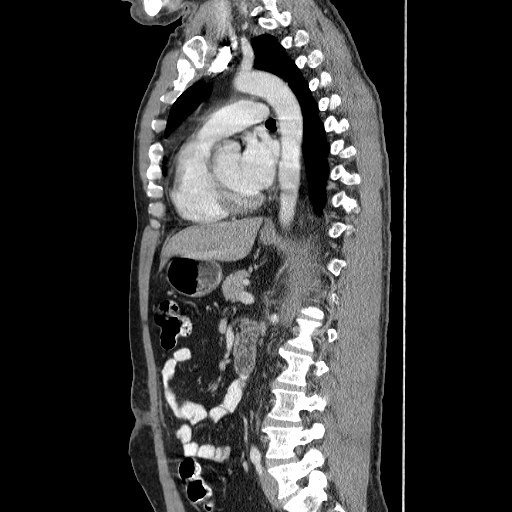

[14 of 46 positions shown; findings below may reference images not displayed]

FINDINGS: There is no mediastinal or hilar mass or adenopathy.  No
pleural or pericardial fluid.

There is pulmonary emphysema evident at the apices.  The left lung
does not show any mass lesions.  In the right lower lobe on image
39, there is a 12 mm nodule.  This could be a metastatic deposit.
Differential diagnosis does include venous varix, but I am
suspicious that this represents a pulmonary mass lesion.  I do not
see any second lesions on the right.  No axillary adenopathy.  No
lytic bone lesions.
IMPRESSION: 12 mm nodule right lower lobe.  Metastatic disease is possible.
This level was not included in the region imaged in [REDACTED].

CT ABDOMEN
FINDINGS: There are several tiny low densities scattered about the
liver, none larger than 2-3 mm in size.  Either likely to represent
cysts, but they are not completely evaluated.  Small metastases
would seem unlikely to be visible at this size.  There may be a
small gallstone dependent in the gallbladder, but this is not
definite.  The spleen is normal.  The pancreas is normal.  The
adrenal glands are normal.  There has been left nephrectomy.  The
right kidney appears normal.  I do not see any enlarged
retroperitoneal lymph nodes.  The aorta and IVC are unremarkable.
No free fluid or air.  No acute bowel pathology is evident.
IMPRESSION: No worrisome finding in the abdomen.  Left nephrectomy.  See above
for full discussion.

## 2010-07-24 ENCOUNTER — Ambulatory Visit: Payer: Self-pay | Admitting: Oncology

## 2010-07-30 IMAGING — CR DG CHEST 2V
2 series · 2 of 2 positions shown · non-contrast
Comparison: 09/18/2008

CLINICAL DATA: Lung lesions preadmission

CHEST - 2 VIEW

[view not recorded (1 of 2)]
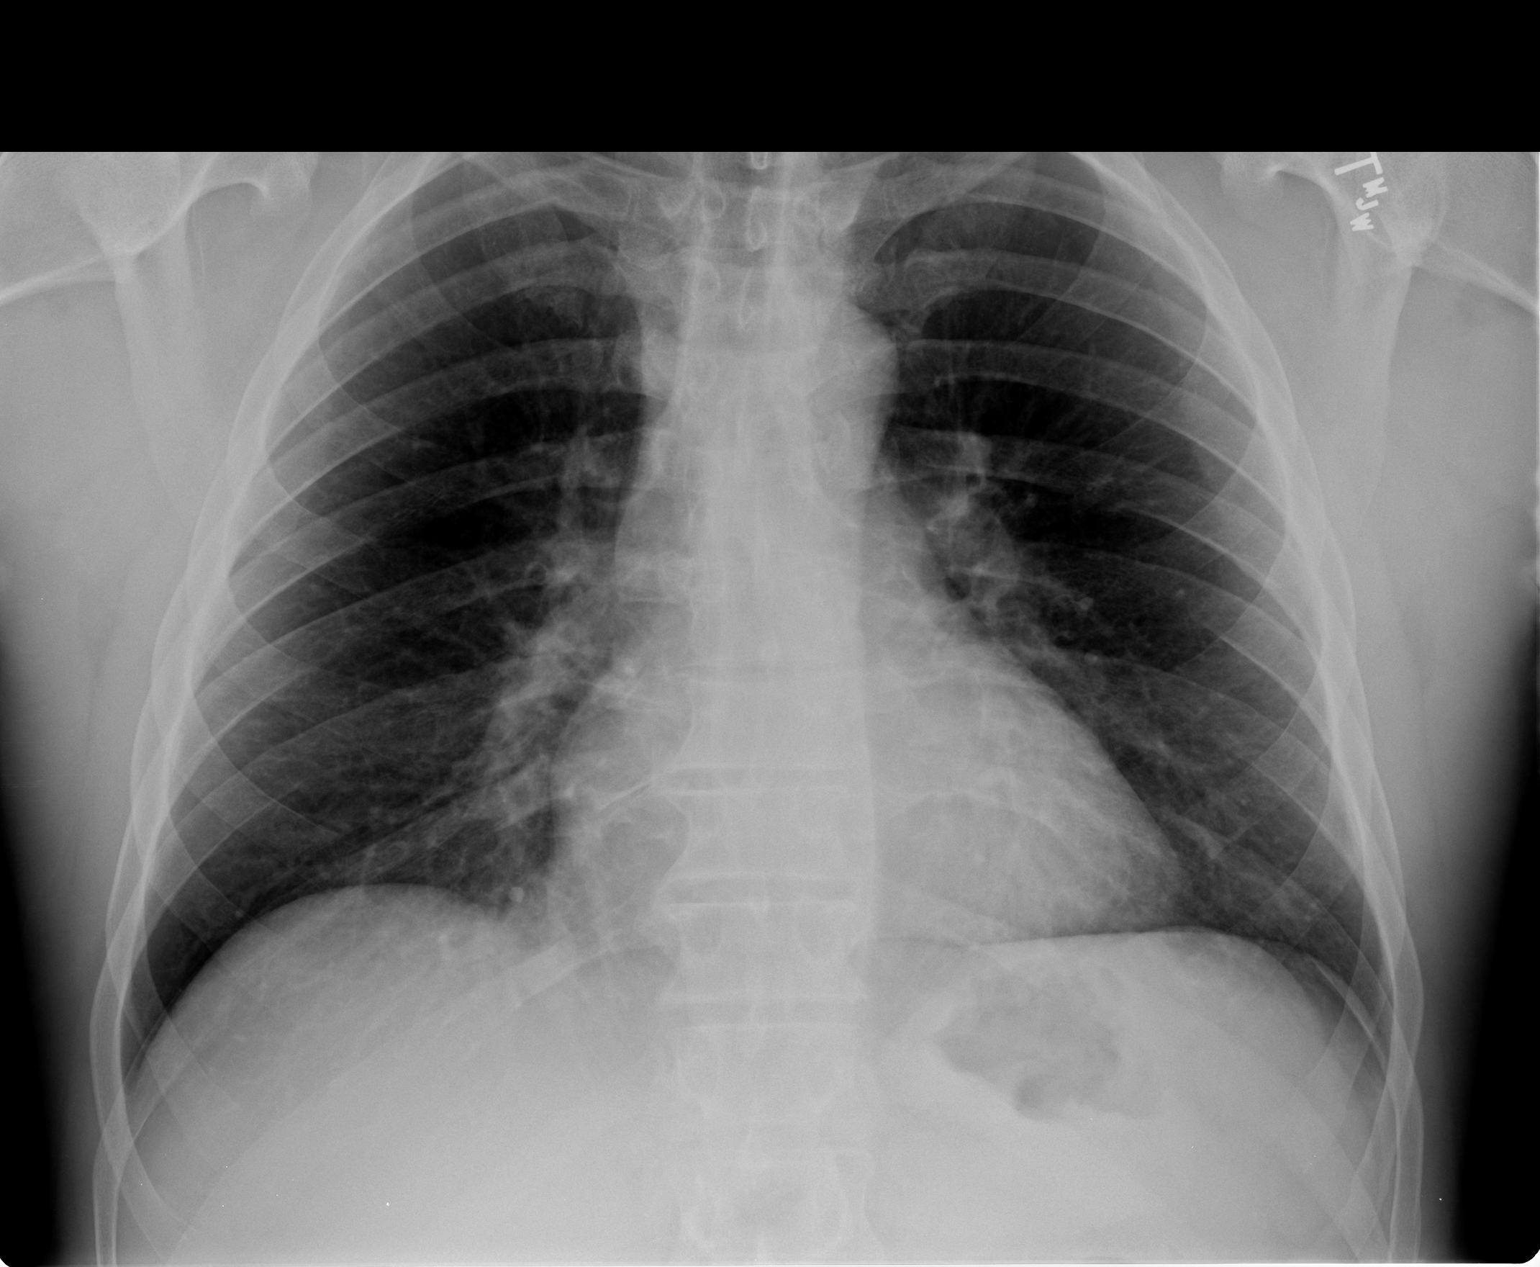

[view not recorded (2 of 2)]
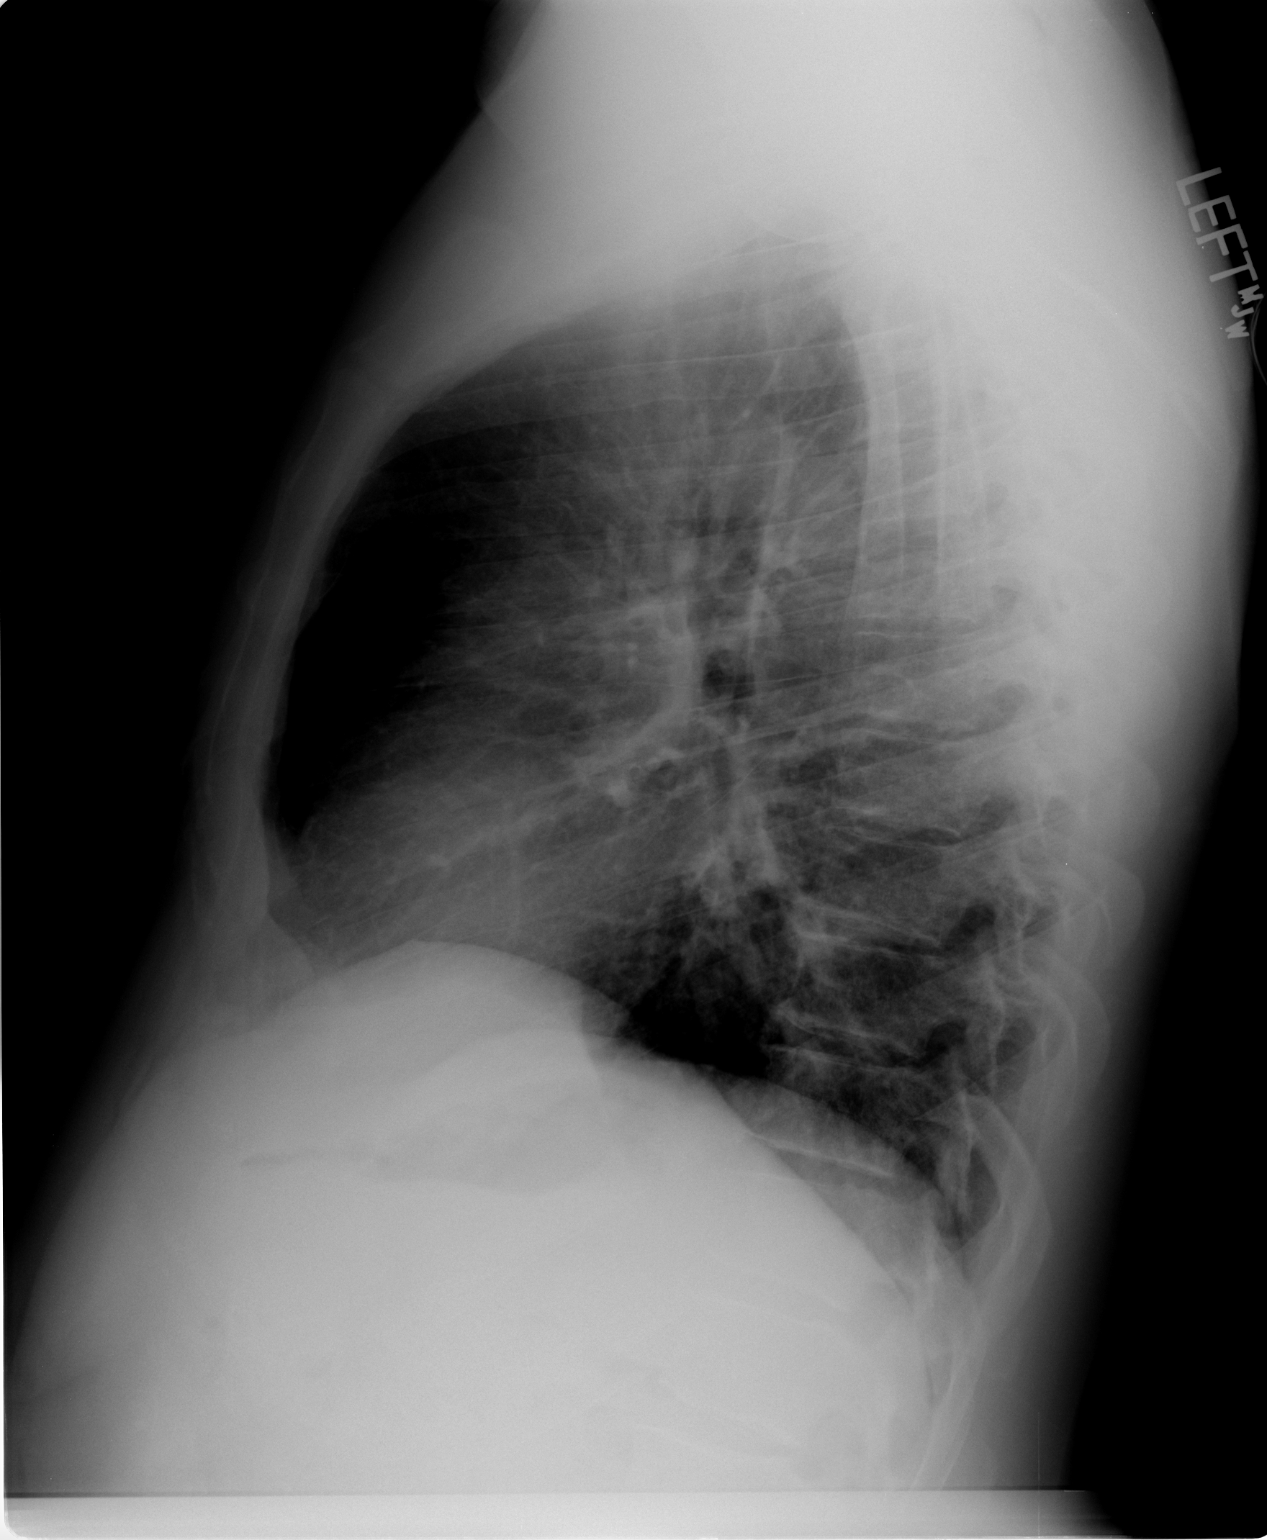

[2 of 2 positions shown; findings below may reference images not displayed]

FINDINGS: The cardiomediastinal silhouette is unremarkable.  No
acute infiltrate or pleural effusion.  No pulmonary edema.  Mild
degenerative changes thoracic spine.  Right basilar nodule is
poorly visualized on lateral view measures about 1.2 cm.
IMPRESSION: No acute infiltrate or edema.
Right basilar nodule is poorly visualized on lateral view measures
about 1.2 cm.

## 2010-08-01 IMAGING — CR DG CHEST 1V PORT
1 series · 1 of 1 positions shown · non-contrast
Comparison: 09/26/2008

CLINICAL DATA: Lung lesion.  Chest tube.

PORTABLE CHEST - 1 VIEW

[view not recorded]
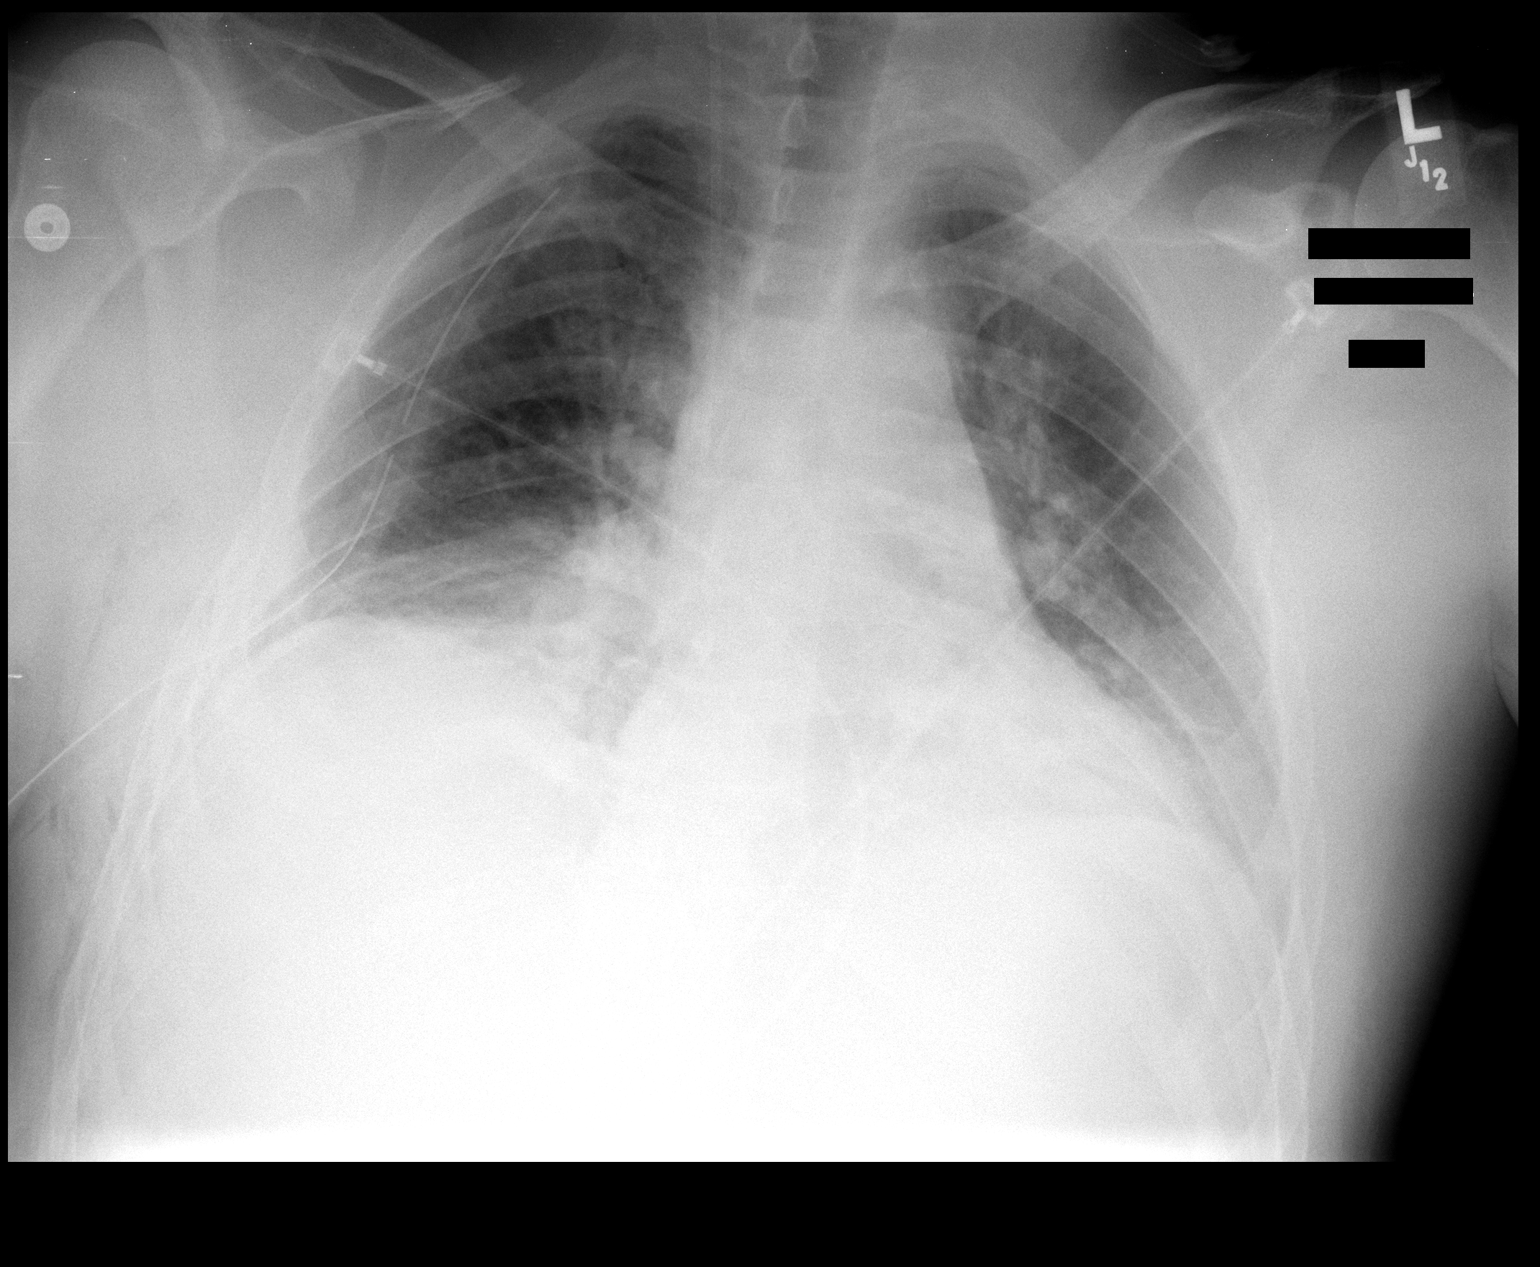

[1 of 1 positions shown; findings below may reference images not displayed]

FINDINGS: Right IJ line unchanged.  Right-sided chest tube remains
in place. Normal heart size for level of inspiration.  Minimal
subcutaneous air about the right hemithorax.  Decreased. No
pneumothorax.  Low lung volumes with resultant pulmonary
interstitial prominence.  Developing right greater than left
bibasilar airspace disease.
IMPRESSION: 1.  Worsened aeration with decreased lung volumes and developing
bibasilar airspace disease, likely atelectasis.
2.  Right-sided chest tube in place without pneumothorax.

## 2010-08-02 IMAGING — CR DG CHEST 1V PORT
1 series · 1 of 1 positions shown · non-contrast
Comparison: 09/27/2008.

CLINICAL DATA: Lung lesion.

PORTABLE CHEST - 1 VIEW

[view not recorded]
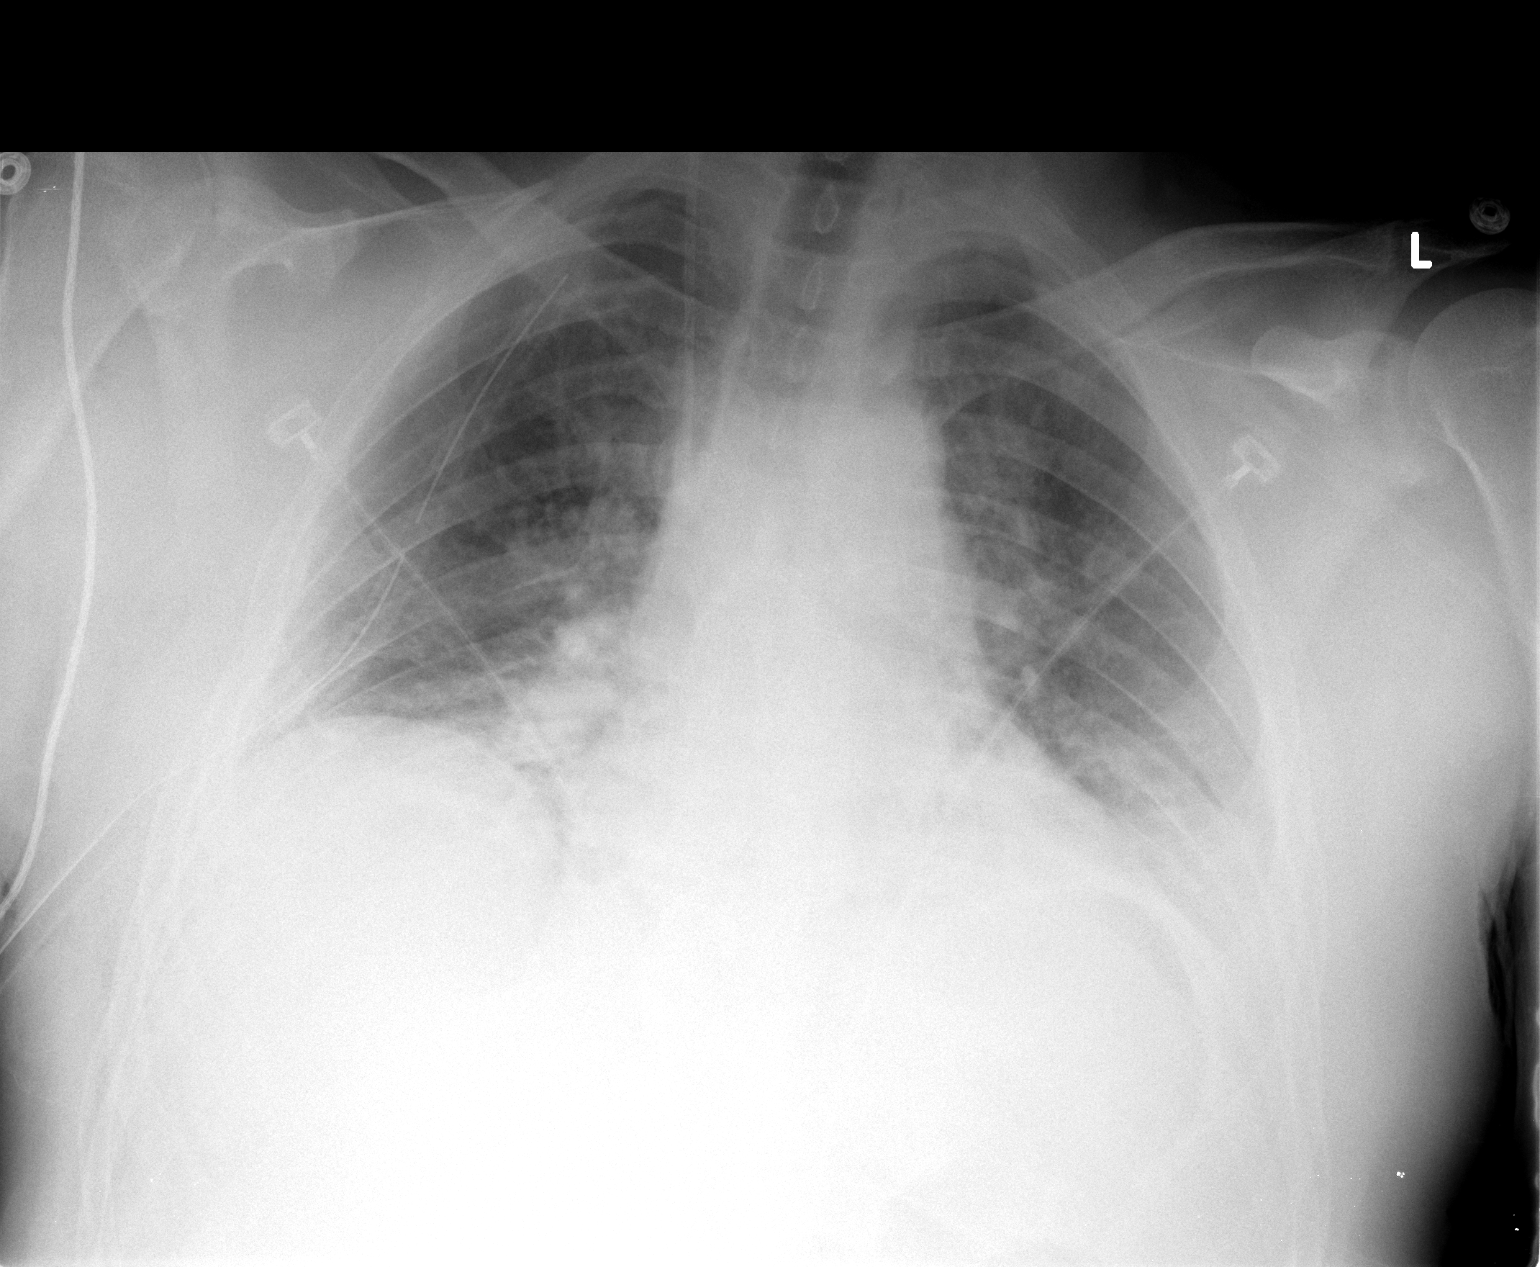

[1 of 1 positions shown; findings below may reference images not displayed]

FINDINGS: Right-sided chest tube remains in place.  Suggestion of
small right apical pneumothorax.  Right central line tip mid
superior vena cava.  Cardiomegaly.  Pulmonary vascular congestion.
Bibasilar opacities may represent atelectasis although infiltrates
difficult to completely exclude in the proper clinical setting.
IMPRESSION: Right-sided chest tube remains place with suggestion of small right
apical pneumothorax.

Cardiomegaly.

Pulmonary vascular congestion.

Bibasilar atelectatic changes suspected as noted above.

## 2010-08-03 IMAGING — CR DG CHEST 1V PORT
1 series · 1 of 1 positions shown · non-contrast
Comparison: 09/28/2008

CLINICAL DATA: Chest tube.  Lung lesion.

PORTABLE CHEST - 1 VIEW

[view not recorded]
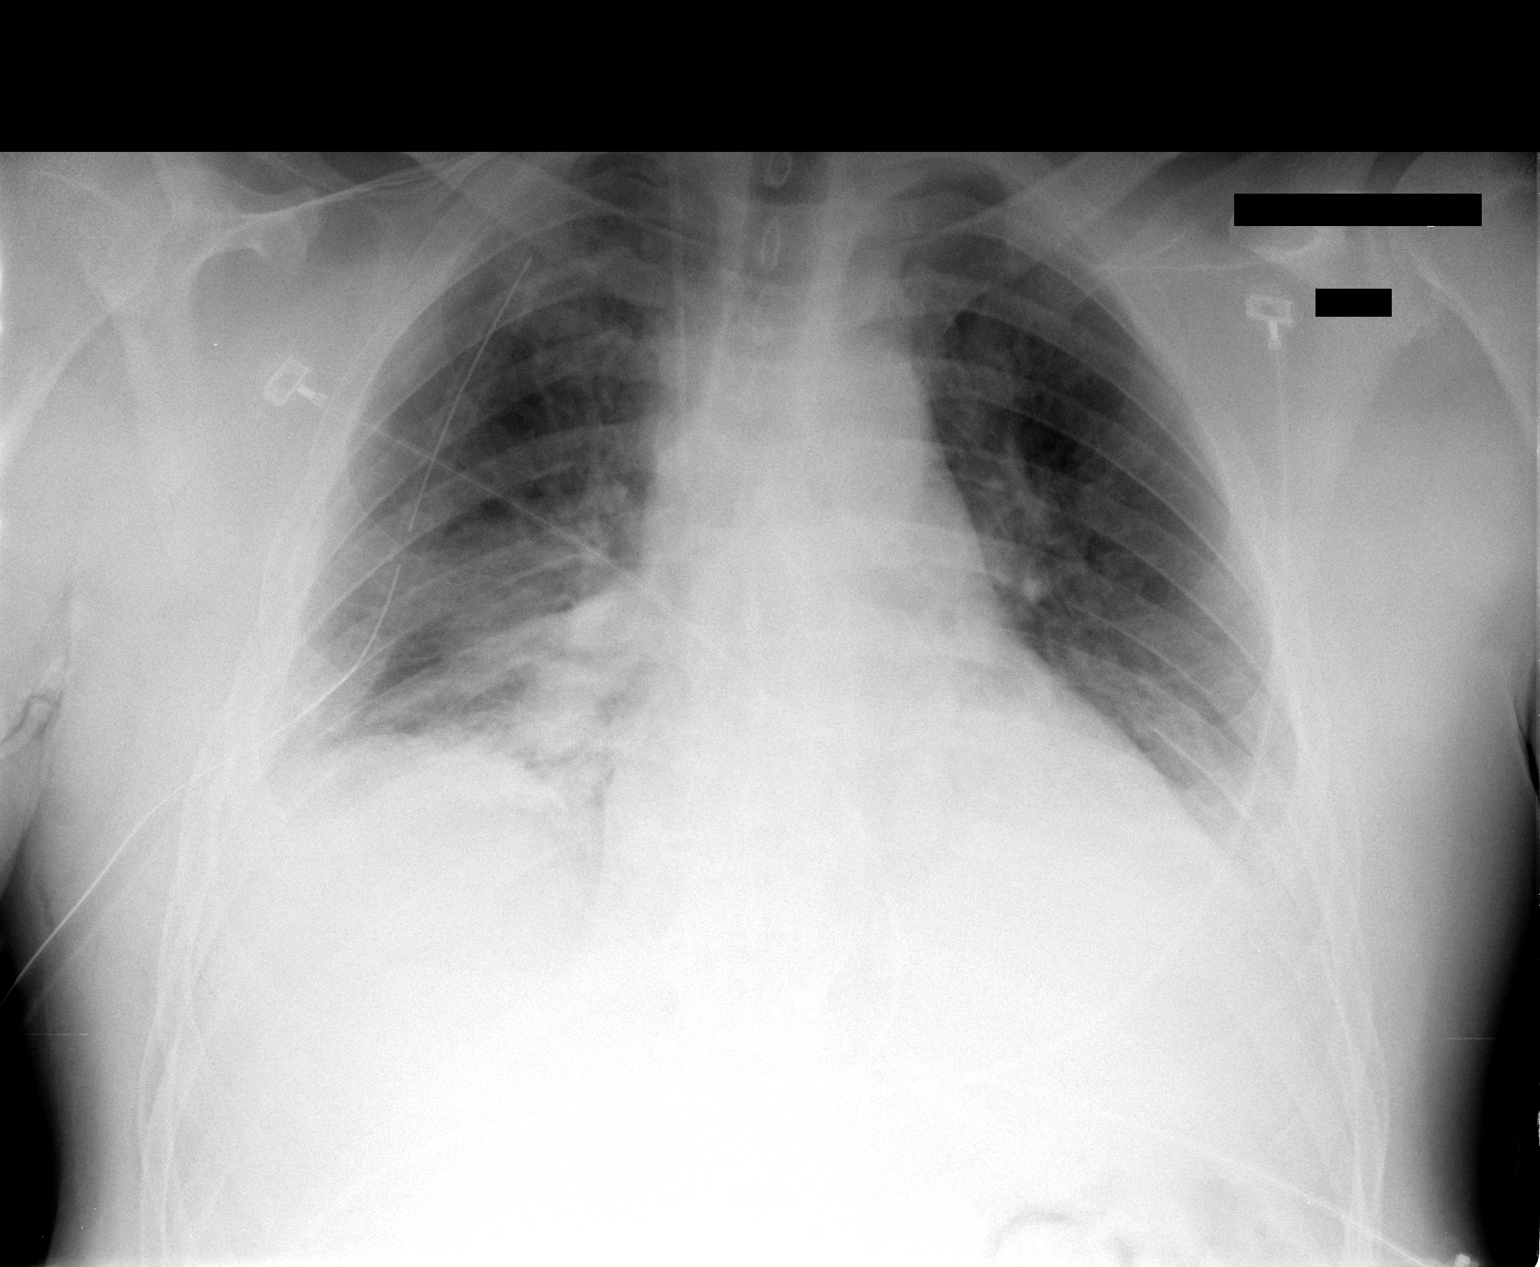

[1 of 1 positions shown; findings below may reference images not displayed]

FINDINGS: Slight interval increase in bibasilar atelectasis with
associated small pleural effusions.  Right chest tube remains in
place no definite right-sided pneumothorax at this time.  The right
IJ central venous catheter tip projects at the proximal SVC level.
Telemetry leads overlie the chest..
IMPRESSION: Worsening bibasilar atelectasis with small bilateral pleural
effusions.

No definite right pneumothorax on today's exam.

## 2010-08-03 IMAGING — CR DG CHEST 1V PORT
1 series · 1 of 1 positions shown · non-contrast
Comparison: 09/29/2008

CLINICAL DATA: Lung lesion removal.  Chest tube removal.

PORTABLE CHEST - 1 VIEW

[view not recorded]
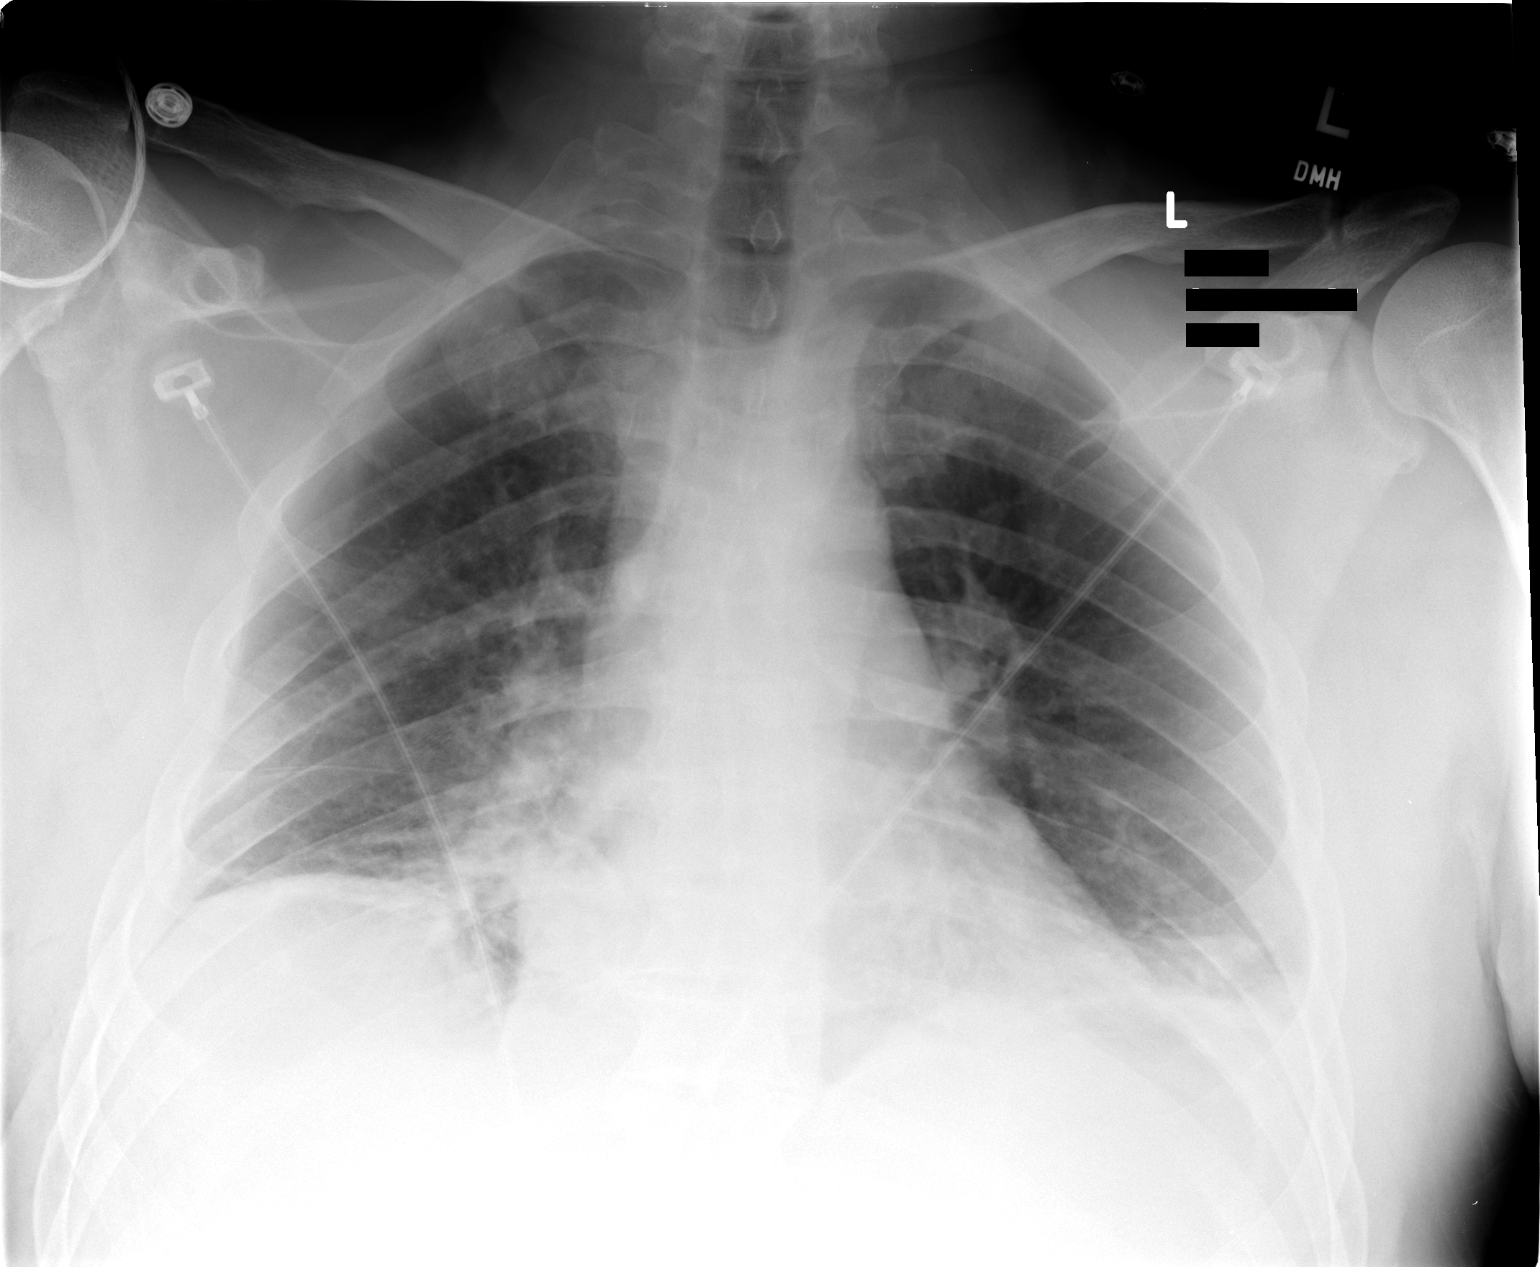

[1 of 1 positions shown; findings below may reference images not displayed]

FINDINGS: The right-sided chest tube has been removed.  There may
be a tiny apical pneumothorax.  The right IJ catheter has been
removed.  There is persistent bibasilar atelectasis and small
pleural effusions.
IMPRESSION: 1.  Removal of right-sided chest tube with possible tiny apical
pneumothorax.
2.  Removal of right IJ catheter.
3.  Persistent bibasilar atelectasis and small bilateral pleural
effusions.

## 2010-08-27 IMAGING — CR DG CHEST 2V
2 series · 2 of 2 positions shown · non-contrast
Comparison: Chest x-ray of 09/30/2008

CLINICAL DATA: Recent lung surgery, follow-up

CHEST - 2 VIEW

[w chest pa]
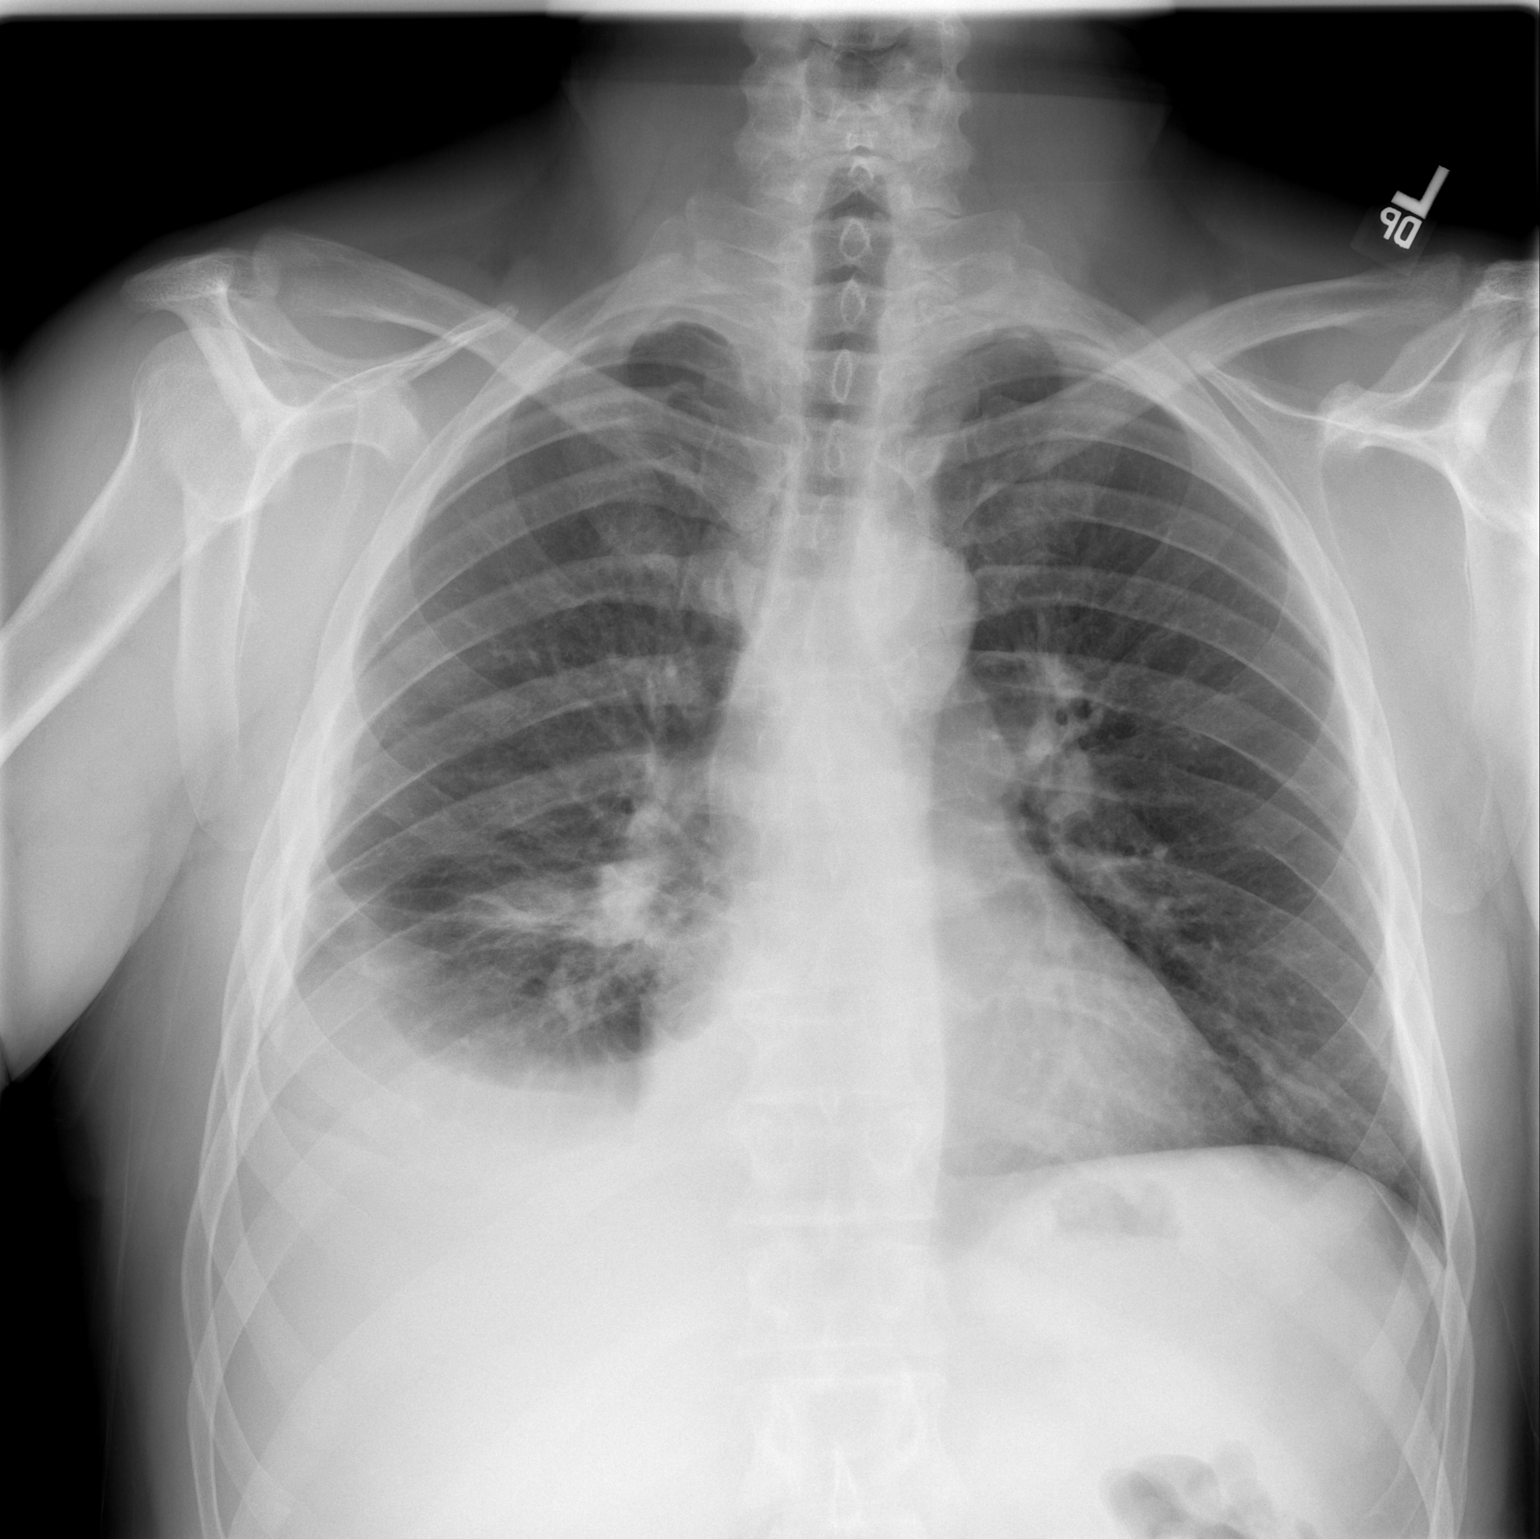

[w chest lat]
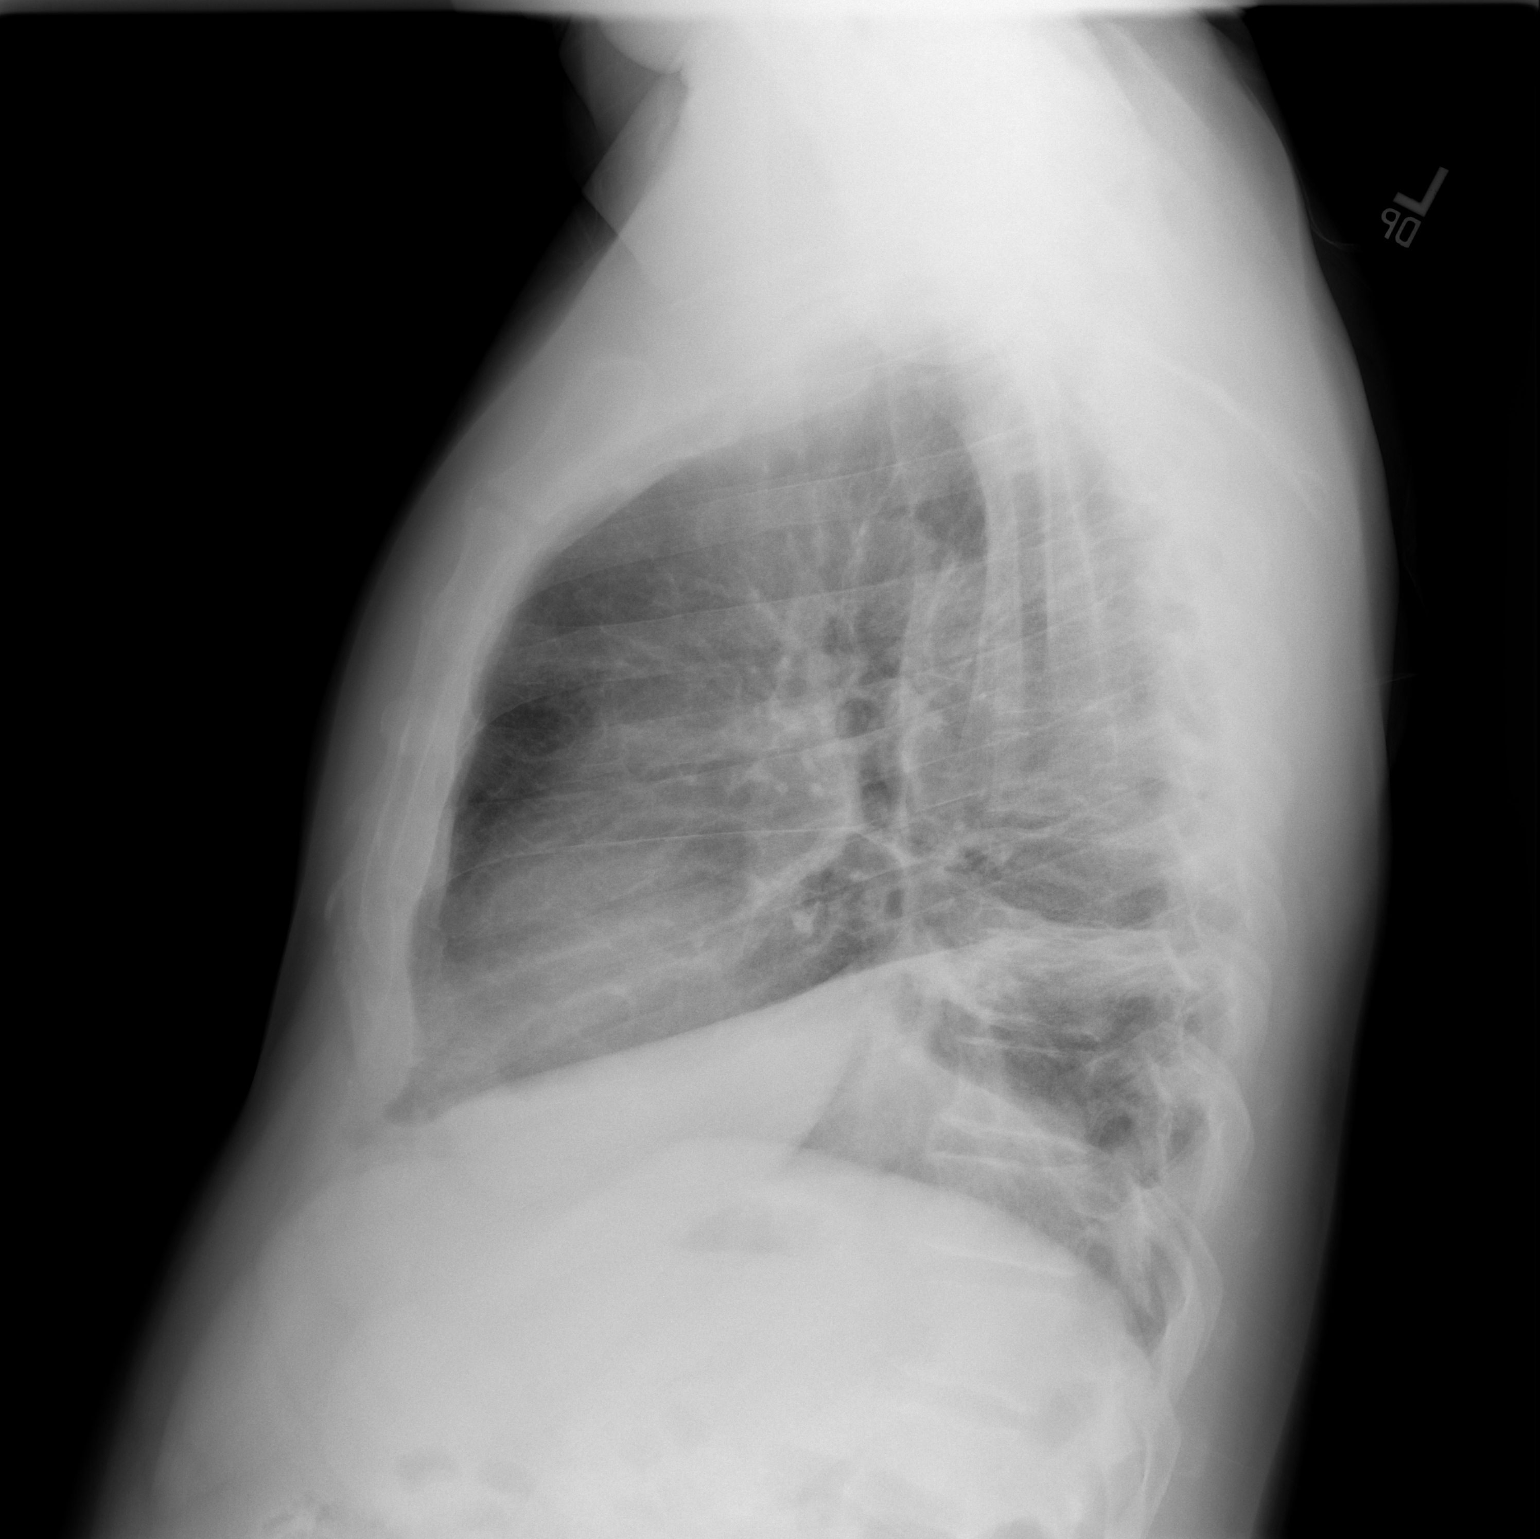

[2 of 2 positions shown; findings below may reference images not displayed]

FINDINGS: Base.  There is still pleural and parenchymal opacity at
the right lung base consistent with effusion and atelectasis.  No
pneumothorax is seen.  The heart is within normal limits in size.
IMPRESSION: Improved aeration with persistent pleural and parenchymal opacity
at the right lung base consistent with right effusion and
atelectasis.

## 2010-10-14 ENCOUNTER — Ambulatory Visit (HOSPITAL_COMMUNITY): Admission: RE | Admit: 2010-10-14 | Discharge: 2010-10-14 | Payer: Self-pay | Admitting: Radiation Oncology

## 2010-10-21 ENCOUNTER — Ambulatory Visit: Payer: Self-pay | Admitting: Oncology

## 2010-10-22 IMAGING — CT CT CHEST W/ CM
2 of 5 series · 15 of 46 positions shown, 17 images · IV contrast (agent unspecified)
Comparison: 09/18/2008 chest and abdomen CTs.  06/30/2008 abdomen
and pelvic CTs.

CT CHEST

CLINICAL DATA: Left renal carcinoma diagnosed [DATE].  Partial
right lung resection for lung cancer.

CT CHEST, ABDOMEN AND PELVIS WITH CONTRAST
TECHNIQUE: Contiguous axial images of the chest abdomen and pelvis
were obtained after IV contrast administration.
Contrast: 100 ml Qmnipaque-9AA

[Series 2: cap 5.0 b40f · axial · 0.86mm/px · z∈[-691,-76]mm · 12 of 139 slices shown, 14 images]
[im 8/139  soft-tissue]
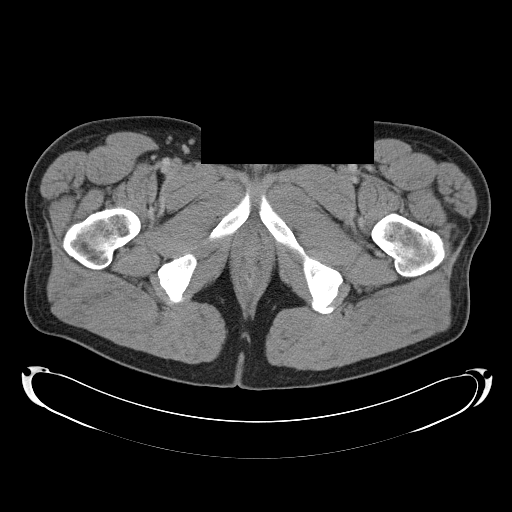
[im 8/139  bone]
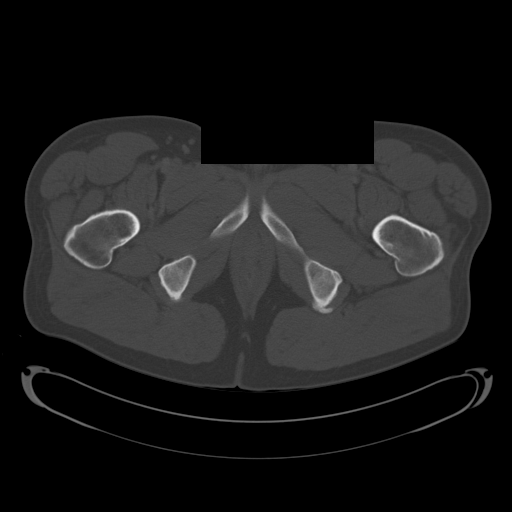
[im 22/139  soft-tissue]
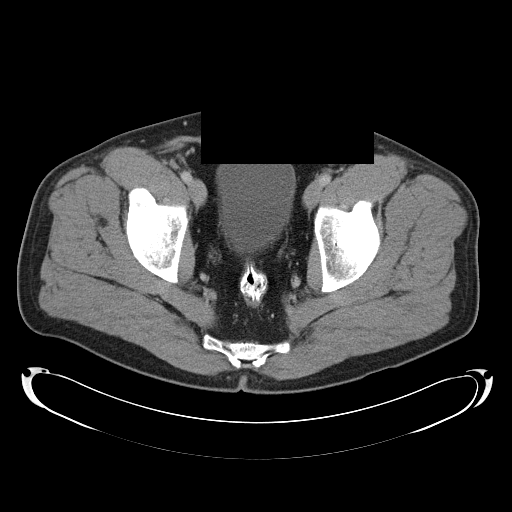
[im 30/139  soft-tissue]
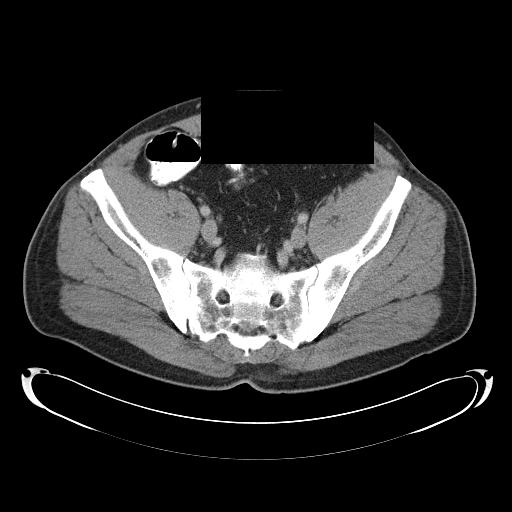
[im 44/139  soft-tissue]
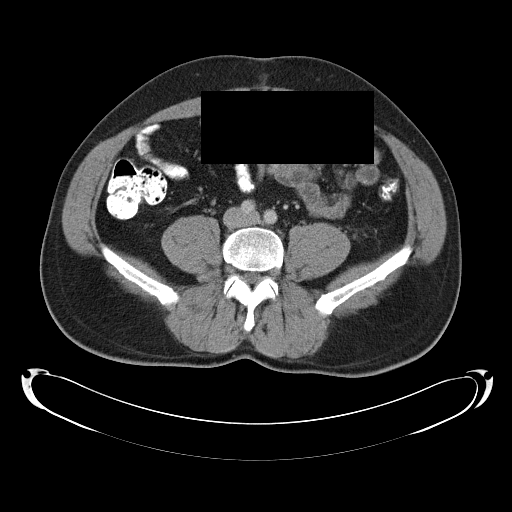
[im 51/139  soft-tissue]
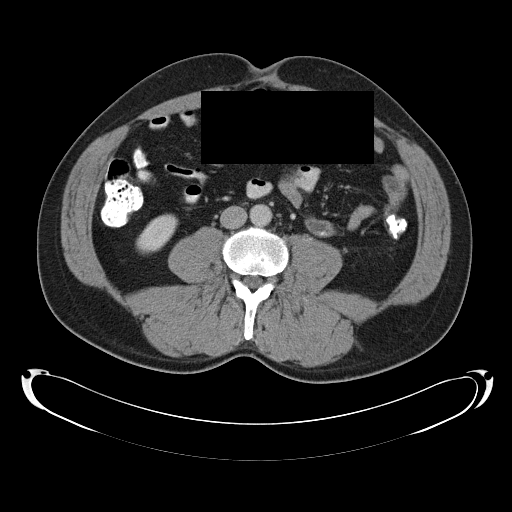
[im 66/139  soft-tissue]
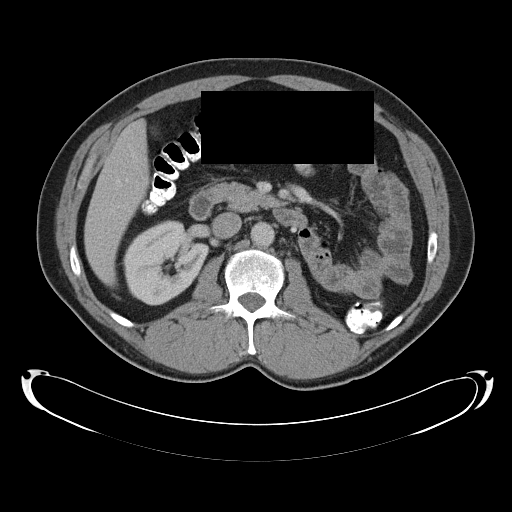
[im 73/139  soft-tissue]
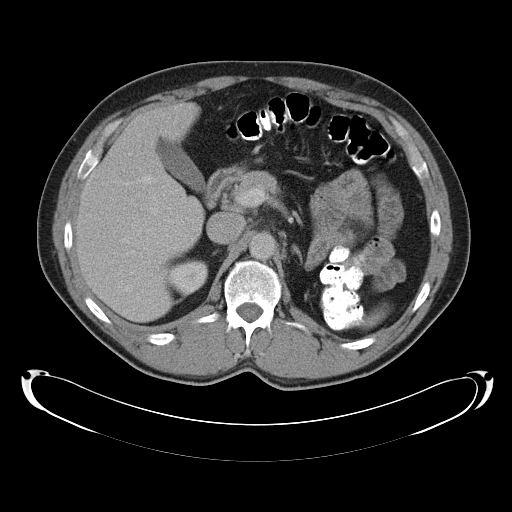
[im 88/139  soft-tissue]
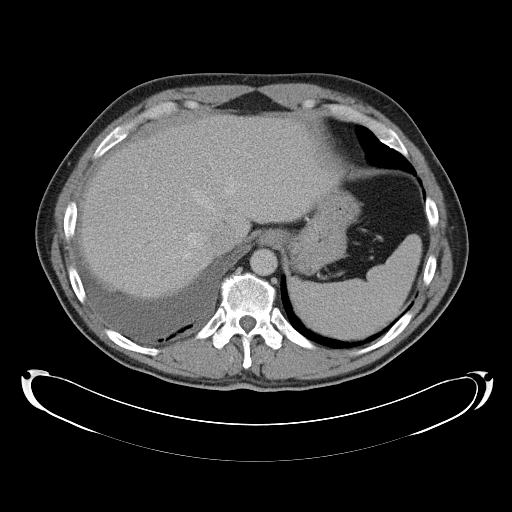
[im 95/139  soft-tissue]
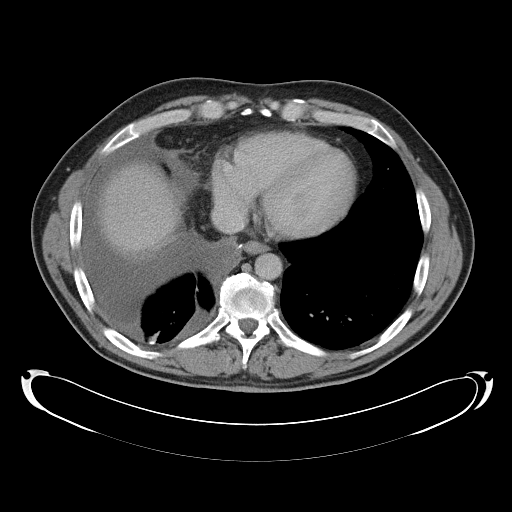
[im 95/139  bone]
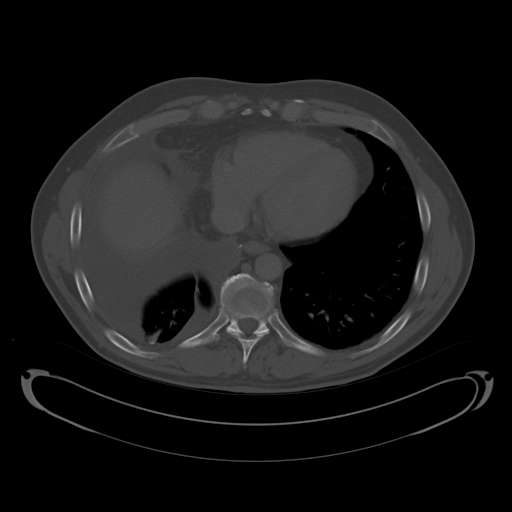
[im 109/139  soft-tissue]
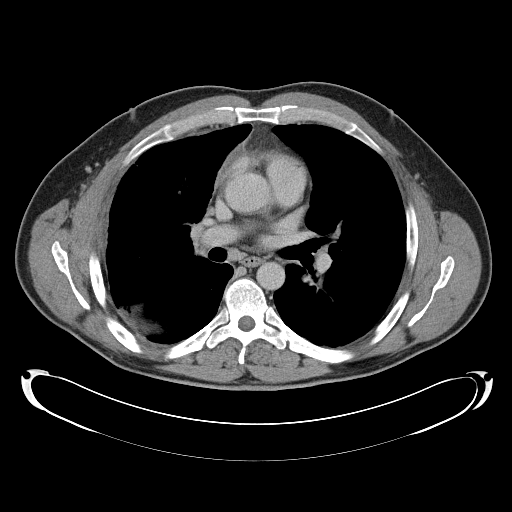
[im 117/139  soft-tissue]
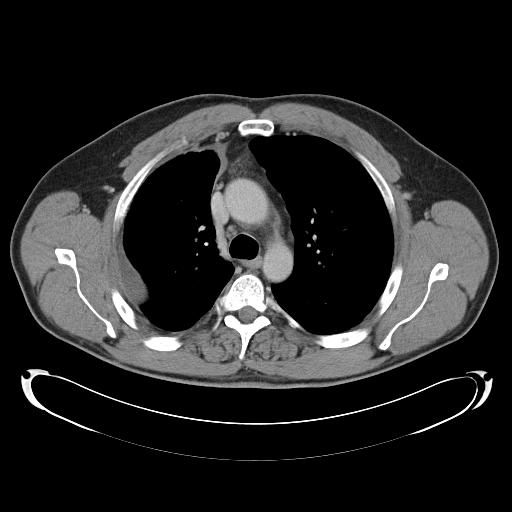
[im 131/139  soft-tissue]
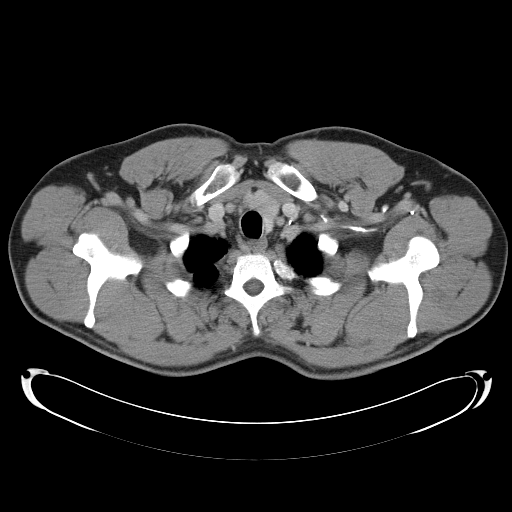

[Series 602: coronal images · coronal · 1.36mm/px · 3 of 100 slices shown]
[im 34/100  soft-tissue]
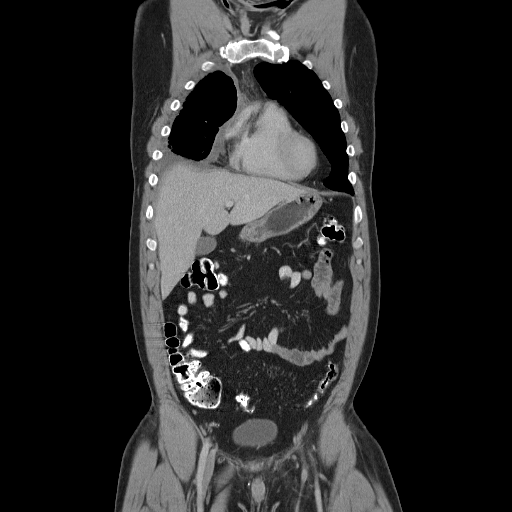
[im 45/100  soft-tissue]
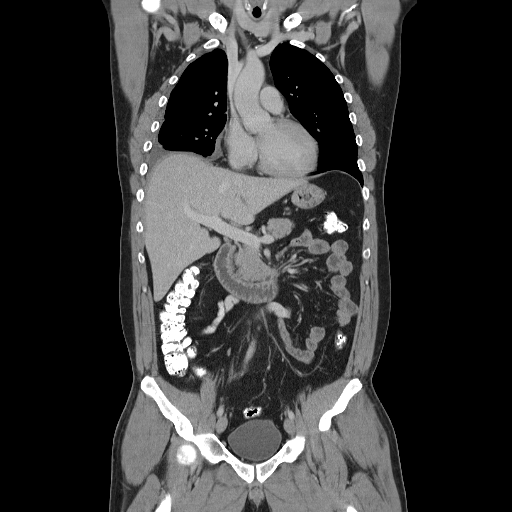
[im 56/100  soft-tissue]
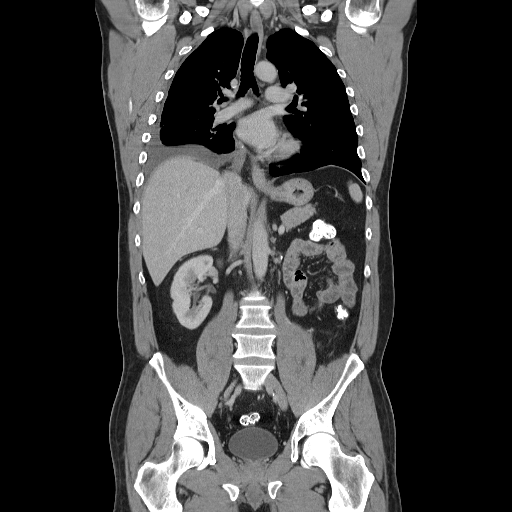

[15 of 46 positions shown; findings below may reference images not displayed]

FINDINGS: Lung windows demonstrate mild centrilobular emphysema.
The previous described right lower lobe lung nodule has been
resected with surgical sutures identified in the right lower lobe
on image 41.  Patchy areas of airspace disease within the
surrounding lung and dependent right middle lobe are likely related
to atelectasis.  No left-sided nodules.

Soft tissue windows demonstrate normal heart size.  No pericardial
effusion.  No left-sided pleural effusion.

New right-sided pleural fluid.  Small.  Areas of loculation
superior laterally on image 18.  Approximately 2.6 cm. Small amount
of fluid tracks in the right major fissure.  New anterior pleural
thickening.

The azygo-esophageal recess node measures 1.3 by 1.7 cm on image 32
and is newly enlarged since the prior exam.  No hilar adenopathy.
Small high mediastinal lymph nodes are felt to be similar on image
10. Small right internal mammary nodes including on image 22 which
are newly enlarged.  Most likely reactive due to the pleural
process.
IMPRESSION: 1.  Interval resection of right lower lobe lung nodule without
evidence of pulmonary metastasis.
2.  Newly enlarged azygo-esophageal recess lymph node.  Although
this could be reactive in this patient with recent partial
pneumonectomy, nodal metastasis cannot be excluded.  This could
either be reevaluated on follow-up or more completely evaluated
with PET.  Of note, PET may be falsely positive given the recent
surgery.  Therefore, short-term follow-up CT may be prudent.
3.  New right-sided pleural fluid with areas of loculation as
described.  Likely postoperative.

CT ABDOMEN
FINDINGS: Scattered tiny hypoattenuating liver lesions which are
similar and likely small cysts.  Normal spleen, stomach, pancreas,
gallbladder, biliary tract, adrenal glands, right kidney.

Status post left nephrectomy.  A small nodule inferior to the left
adrenal gland and superior to the surgical sutures is similar on
image 68 at 6 mm. No retroperitoneal or retrocrural adenopathy.
Normal colon, appendix, and terminal ileum.

Normal abdominal small bowel without ascites.
IMPRESSION: 1. No acute process or evidence of metastatic disease in the
abdomen.
2.  Status post left nephrectomy with stable nodule adjacent the
surgical sutures.

CT PELVIS
FINDINGS: Normal pelvic bowel loops.  No pelvic adenopathy.
Normal urinary bladder and prostate.  No ascites.  Sclerosis about
the sacroiliac joints of indeterminate etiology.  Probable bone
island in the right iliac wing.
IMPRESSION: 1. No acute process or evidence of metastatic disease in the
pelvis.
2.  Bilateral sacroiliac joint sclerosis of indeterminate etiology.
Similar to on prior exam.  Correlate with signs/symptoms of
sacroiliitis.  Considerations include inflammation (as can be seen
with inflammatory bowel disease or ankylosing spondylitis) or
infection.  Age advanced osteoarthritis felt less likely.

## 2010-10-23 LAB — CBC WITH DIFFERENTIAL/PLATELET
BASO%: 0.4 % (ref 0.0–2.0)
Basophils Absolute: 0 10*3/uL (ref 0.0–0.1)
EOS%: 2.5 % (ref 0.0–7.0)
Eosinophils Absolute: 0.2 10*3/uL (ref 0.0–0.5)
HCT: 42.7 % (ref 38.4–49.9)
HGB: 14.8 g/dL (ref 13.0–17.1)
LYMPH%: 21.3 % (ref 14.0–49.0)
MCH: 32.7 pg (ref 27.2–33.4)
MCHC: 34.7 g/dL (ref 32.0–36.0)
MCV: 94.3 fL (ref 79.3–98.0)
MONO#: 0.4 10*3/uL (ref 0.1–0.9)
MONO%: 6.7 % (ref 0.0–14.0)
NEUT#: 4.4 10*3/uL (ref 1.5–6.5)
NEUT%: 69.1 % (ref 39.0–75.0)
Platelets: 200 10*3/uL (ref 140–400)
RBC: 4.53 10*6/uL (ref 4.20–5.82)
RDW: 13.9 % (ref 11.0–14.6)
WBC: 6.4 10*3/uL (ref 4.0–10.3)
lymph#: 1.4 10*3/uL (ref 0.9–3.3)

## 2010-10-23 LAB — COMPREHENSIVE METABOLIC PANEL
ALT: 59 U/L — ABNORMAL HIGH (ref 0–53)
AST: 33 U/L (ref 0–37)
Albumin: 4 g/dL (ref 3.5–5.2)
Alkaline Phosphatase: 100 U/L (ref 39–117)
BUN: 11 mg/dL (ref 6–23)
CO2: 29 mEq/L (ref 19–32)
Calcium: 9 mg/dL (ref 8.4–10.5)
Chloride: 103 mEq/L (ref 96–112)
Creatinine, Ser: 1.26 mg/dL (ref 0.40–1.50)
Glucose, Bld: 101 mg/dL — ABNORMAL HIGH (ref 70–99)
Potassium: 4.2 mEq/L (ref 3.5–5.3)
Sodium: 140 mEq/L (ref 135–145)
Total Bilirubin: 0.8 mg/dL (ref 0.3–1.2)
Total Protein: 7.7 g/dL (ref 6.0–8.3)

## 2010-12-05 ENCOUNTER — Ambulatory Visit: Payer: Self-pay | Admitting: Oncology

## 2010-12-06 LAB — COMPREHENSIVE METABOLIC PANEL
ALT: 45 U/L (ref 0–53)
AST: 24 U/L (ref 0–37)
Albumin: 4.1 g/dL (ref 3.5–5.2)
Alkaline Phosphatase: 123 U/L — ABNORMAL HIGH (ref 39–117)
BUN: 11 mg/dL (ref 6–23)
CO2: 29 mEq/L (ref 19–32)
Calcium: 9 mg/dL (ref 8.4–10.5)
Chloride: 104 mEq/L (ref 96–112)
Creatinine, Ser: 1.24 mg/dL (ref 0.40–1.50)
Glucose, Bld: 123 mg/dL — ABNORMAL HIGH (ref 70–99)
Potassium: 4.6 mEq/L (ref 3.5–5.3)
Sodium: 139 mEq/L (ref 135–145)
Total Bilirubin: 0.3 mg/dL (ref 0.3–1.2)
Total Protein: 7.4 g/dL (ref 6.0–8.3)

## 2010-12-06 LAB — CBC WITH DIFFERENTIAL/PLATELET
BASO%: 0.3 % (ref 0.0–2.0)
Basophils Absolute: 0 10*3/uL (ref 0.0–0.1)
EOS%: 2.2 % (ref 0.0–7.0)
Eosinophils Absolute: 0.1 10*3/uL (ref 0.0–0.5)
HCT: 40.8 % (ref 38.4–49.9)
HGB: 13.7 g/dL (ref 13.0–17.1)
LYMPH%: 26.7 % (ref 14.0–49.0)
MCH: 32.7 pg (ref 27.2–33.4)
MCHC: 33.6 g/dL (ref 32.0–36.0)
MCV: 97.1 fL (ref 79.3–98.0)
MONO#: 0.5 10*3/uL (ref 0.1–0.9)
MONO%: 8.8 % (ref 0.0–14.0)
NEUT#: 3.4 10*3/uL (ref 1.5–6.5)
NEUT%: 62 % (ref 39.0–75.0)
Platelets: 206 10*3/uL (ref 140–400)
RBC: 4.21 10*6/uL (ref 4.20–5.82)
RDW: 15.7 % — ABNORMAL HIGH (ref 11.0–14.6)
WBC: 5.5 10*3/uL (ref 4.0–10.3)
lymph#: 1.5 10*3/uL (ref 0.9–3.3)

## 2010-12-22 DIAGNOSIS — C7889 Secondary malignant neoplasm of other digestive organs: Secondary | ICD-10-CM

## 2010-12-22 HISTORY — DX: Secondary malignant neoplasm of other digestive organs: C78.89

## 2010-12-27 ENCOUNTER — Encounter
Admission: RE | Admit: 2010-12-27 | Discharge: 2010-12-27 | Payer: Self-pay | Source: Home / Self Care | Attending: Oncology | Admitting: Oncology

## 2010-12-27 LAB — CBC WITH DIFFERENTIAL/PLATELET
BASO%: 0.4 % (ref 0.0–2.0)
Basophils Absolute: 0 10*3/uL (ref 0.0–0.1)
EOS%: 2.1 % (ref 0.0–7.0)
Eosinophils Absolute: 0.1 10*3/uL (ref 0.0–0.5)
HCT: 41.7 % (ref 38.4–49.9)
HGB: 14.6 g/dL (ref 13.0–17.1)
LYMPH%: 32.1 % (ref 14.0–49.0)
MCH: 33.4 pg (ref 27.2–33.4)
MCHC: 35 g/dL (ref 32.0–36.0)
MCV: 95.5 fL (ref 79.3–98.0)
MONO#: 0.3 10*3/uL (ref 0.1–0.9)
MONO%: 6.7 % (ref 0.0–14.0)
NEUT#: 2.8 10*3/uL (ref 1.5–6.5)
NEUT%: 58.7 % (ref 39.0–75.0)
Platelets: 155 10*3/uL (ref 140–400)
RBC: 4.37 10*6/uL (ref 4.20–5.82)
RDW: 16.1 % — ABNORMAL HIGH (ref 11.0–14.6)
WBC: 4.8 10*3/uL (ref 4.0–10.3)
lymph#: 1.5 10*3/uL (ref 0.9–3.3)

## 2010-12-27 LAB — COMPREHENSIVE METABOLIC PANEL
ALT: 73 U/L — ABNORMAL HIGH (ref 0–53)
AST: 47 U/L — ABNORMAL HIGH (ref 0–37)
Albumin: 4.4 g/dL (ref 3.5–5.2)
Alkaline Phosphatase: 127 U/L — ABNORMAL HIGH (ref 39–117)
BUN: 11 mg/dL (ref 6–23)
CO2: 28 mEq/L (ref 19–32)
Calcium: 9.3 mg/dL (ref 8.4–10.5)
Chloride: 102 mEq/L (ref 96–112)
Creatinine, Ser: 1.23 mg/dL (ref 0.40–1.50)
Glucose, Bld: 98 mg/dL (ref 70–99)
Potassium: 4.2 mEq/L (ref 3.5–5.3)
Sodium: 138 mEq/L (ref 135–145)
Total Bilirubin: 0.6 mg/dL (ref 0.3–1.2)
Total Protein: 7.8 g/dL (ref 6.0–8.3)

## 2011-01-12 ENCOUNTER — Encounter: Payer: Self-pay | Admitting: Oncology

## 2011-01-21 NOTE — Assessment & Plan Note (Signed)
Summary: [facility]   History of Present Illness: patient not seen by me                ]

## 2011-02-07 ENCOUNTER — Other Ambulatory Visit: Payer: Self-pay | Admitting: Oncology

## 2011-02-07 ENCOUNTER — Encounter (HOSPITAL_BASED_OUTPATIENT_CLINIC_OR_DEPARTMENT_OTHER): Payer: PRIVATE HEALTH INSURANCE | Admitting: Oncology

## 2011-02-07 DIAGNOSIS — Z85048 Personal history of other malignant neoplasm of rectum, rectosigmoid junction, and anus: Secondary | ICD-10-CM

## 2011-02-07 DIAGNOSIS — C801 Malignant (primary) neoplasm, unspecified: Secondary | ICD-10-CM

## 2011-02-07 DIAGNOSIS — C649 Malignant neoplasm of unspecified kidney, except renal pelvis: Secondary | ICD-10-CM

## 2011-02-07 DIAGNOSIS — C7800 Secondary malignant neoplasm of unspecified lung: Secondary | ICD-10-CM

## 2011-02-07 LAB — COMPREHENSIVE METABOLIC PANEL
ALT: 64 U/L — ABNORMAL HIGH (ref 0–53)
AST: 41 U/L — ABNORMAL HIGH (ref 0–37)
Albumin: 3.8 g/dL (ref 3.5–5.2)
Alkaline Phosphatase: 103 U/L (ref 39–117)
BUN: 11 mg/dL (ref 6–23)
CO2: 29 mEq/L (ref 19–32)
Calcium: 9.1 mg/dL (ref 8.4–10.5)
Chloride: 104 mEq/L (ref 96–112)
Creatinine, Ser: 1.34 mg/dL (ref 0.40–1.50)
Glucose, Bld: 99 mg/dL (ref 70–99)
Potassium: 4.1 mEq/L (ref 3.5–5.3)
Sodium: 141 mEq/L (ref 135–145)
Total Bilirubin: 0.7 mg/dL (ref 0.3–1.2)
Total Protein: 7.5 g/dL (ref 6.0–8.3)

## 2011-02-07 LAB — CBC WITH DIFFERENTIAL/PLATELET
BASO%: 0.1 % (ref 0.0–2.0)
Basophils Absolute: 0 10*3/uL (ref 0.0–0.1)
EOS%: 2 % (ref 0.0–7.0)
Eosinophils Absolute: 0.1 10*3/uL (ref 0.0–0.5)
HCT: 39.5 % (ref 38.4–49.9)
HGB: 13.8 g/dL (ref 13.0–17.1)
LYMPH%: 37 % (ref 14.0–49.0)
MCH: 35.3 pg — ABNORMAL HIGH (ref 27.2–33.4)
MCHC: 34.9 g/dL (ref 32.0–36.0)
MCV: 101.1 fL — ABNORMAL HIGH (ref 79.3–98.0)
MONO#: 0.2 10*3/uL (ref 0.1–0.9)
MONO%: 5 % (ref 0.0–14.0)
NEUT#: 2.7 10*3/uL (ref 1.5–6.5)
NEUT%: 55.9 % (ref 39.0–75.0)
Platelets: 153 10*3/uL (ref 140–400)
RBC: 3.91 10*6/uL — ABNORMAL LOW (ref 4.20–5.82)
RDW: 17.5 % — ABNORMAL HIGH (ref 11.0–14.6)
WBC: 4.8 10*3/uL (ref 4.0–10.3)
lymph#: 1.8 10*3/uL (ref 0.9–3.3)

## 2011-02-11 ENCOUNTER — Other Ambulatory Visit: Payer: Self-pay | Admitting: Oncology

## 2011-02-11 DIAGNOSIS — C649 Malignant neoplasm of unspecified kidney, except renal pelvis: Secondary | ICD-10-CM

## 2011-03-07 ENCOUNTER — Ambulatory Visit
Admission: RE | Admit: 2011-03-07 | Discharge: 2011-03-07 | Disposition: A | Discharge status: Home / Self Care | Payer: PRIVATE HEALTH INSURANCE | Source: Ambulatory Visit | Attending: Oncology | Admitting: Oncology

## 2011-03-07 ENCOUNTER — Other Ambulatory Visit (HOSPITAL_COMMUNITY): Payer: PRIVATE HEALTH INSURANCE

## 2011-03-07 DIAGNOSIS — C649 Malignant neoplasm of unspecified kidney, except renal pelvis: Secondary | ICD-10-CM

## 2011-03-07 MED ORDER — IOHEXOL 300 MG/ML  SOLN
100.0000 mL | Freq: Once | INTRAMUSCULAR | Status: AC | PRN
  Administered 2011-03-07: 100 mL via INTRAVENOUS

## 2011-03-14 ENCOUNTER — Other Ambulatory Visit: Payer: Self-pay | Admitting: Medical

## 2011-03-14 ENCOUNTER — Encounter (HOSPITAL_BASED_OUTPATIENT_CLINIC_OR_DEPARTMENT_OTHER): Payer: PRIVATE HEALTH INSURANCE | Admitting: Oncology

## 2011-03-14 ENCOUNTER — Ambulatory Visit: Payer: Self-pay | Attending: Radiation Oncology | Admitting: Radiation Oncology

## 2011-03-14 DIAGNOSIS — C7800 Secondary malignant neoplasm of unspecified lung: Secondary | ICD-10-CM

## 2011-03-14 DIAGNOSIS — C649 Malignant neoplasm of unspecified kidney, except renal pelvis: Secondary | ICD-10-CM

## 2011-03-14 DIAGNOSIS — C801 Malignant (primary) neoplasm, unspecified: Secondary | ICD-10-CM

## 2011-03-14 LAB — CBC WITH DIFFERENTIAL/PLATELET
BASO%: 0.4 % (ref 0.0–2.0)
Basophils Absolute: 0 10*3/uL (ref 0.0–0.1)
EOS%: 1.8 % (ref 0.0–7.0)
Eosinophils Absolute: 0.1 10*3/uL (ref 0.0–0.5)
HCT: 40.7 % (ref 38.4–49.9)
HGB: 13.9 g/dL (ref 13.0–17.1)
LYMPH%: 28.2 % (ref 14.0–49.0)
MCH: 35.6 pg — ABNORMAL HIGH (ref 27.2–33.4)
MCHC: 34.3 g/dL (ref 32.0–36.0)
MCV: 104 fL — ABNORMAL HIGH (ref 79.3–98.0)
MONO#: 0.3 10*3/uL (ref 0.1–0.9)
MONO%: 4.6 % (ref 0.0–14.0)
NEUT#: 3.7 10*3/uL (ref 1.5–6.5)
NEUT%: 65 % (ref 39.0–75.0)
Platelets: 187 10*3/uL (ref 140–400)
RBC: 3.92 10*6/uL — ABNORMAL LOW (ref 4.20–5.82)
RDW: 14.7 % — ABNORMAL HIGH (ref 11.0–14.6)
WBC: 5.7 10*3/uL (ref 4.0–10.3)
lymph#: 1.6 10*3/uL (ref 0.9–3.3)

## 2011-03-14 LAB — COMPREHENSIVE METABOLIC PANEL
ALT: 53 U/L (ref 0–53)
AST: 36 U/L (ref 0–37)
Albumin: 4.5 g/dL (ref 3.5–5.2)
Alkaline Phosphatase: 100 U/L (ref 39–117)
BUN: 13 mg/dL (ref 6–23)
CO2: 25 mEq/L (ref 19–32)
Calcium: 9.6 mg/dL (ref 8.4–10.5)
Chloride: 104 mEq/L (ref 96–112)
Creatinine, Ser: 1.34 mg/dL (ref 0.40–1.50)
Glucose, Bld: 90 mg/dL (ref 70–99)
Potassium: 4.7 mEq/L (ref 3.5–5.3)
Sodium: 137 mEq/L (ref 135–145)
Total Bilirubin: 0.5 mg/dL (ref 0.3–1.2)
Total Protein: 7.9 g/dL (ref 6.0–8.3)

## 2011-03-15 IMAGING — PT NM PET TUM IMG RESTAG (PS) SKULL BASE T - THIGH
6 series · 25 of 25 positions shown · non-contrast
Comparison: CT scan chest abdomen and pelvis 12/18/2008

CLINICAL DATA: Subsequent treatment strategy for lung cancer and
renal cell cancer.  

NUCLEAR MEDICINE PET CT RESTAGING (PS) SKULL BASE TO THIGH
TECHNIQUE: 17.0 mCi F-18 FDG was injected intravenously via the
right antecubital.  Full-ring PET imaging was performed from the
skull base through the mid-thighs 50  minutes after injection.  CT
data was obtained and used for attenuation correction and anatomic
localization only.  (This was not acquired as a diagnostic CT
examination.)
Fasting Blood Glucose:  97

[Series 1: pet ac · axial · 3.3mm · 4.69mm/px · z∈[-883,-13]mm · 5 of 267 slices shown]
[im 1/267]
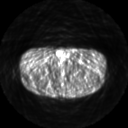
[im 67/267]
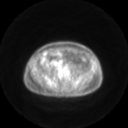
[im 134/267]
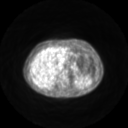
[im 200/267]
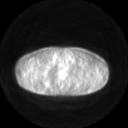
[im 267/267]
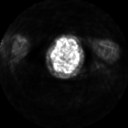

[Series 2: pet nac · axial · 3.3mm · 4.69mm/px · z∈[-883,-13]mm · 6 of 267 slices shown]
[im 1/267]
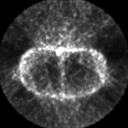
[im 54/267]
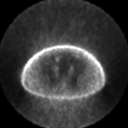
[im 107/267]
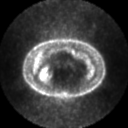
[im 160/267]
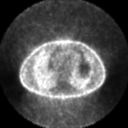
[im 213/267]
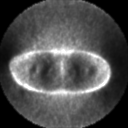
[im 267/267]
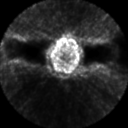

[Series 2: ct images · axial · 3.8mm · 0.98mm/px · z∈[-883,-14]mm · 6 of 266 slices shown]
[im 1/266]
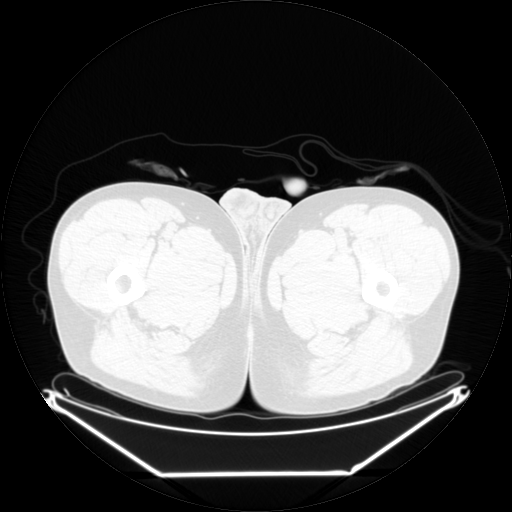
[im 54/266]
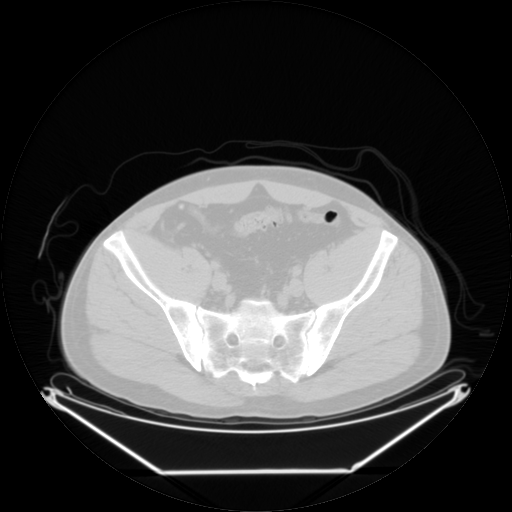
[im 107/266]
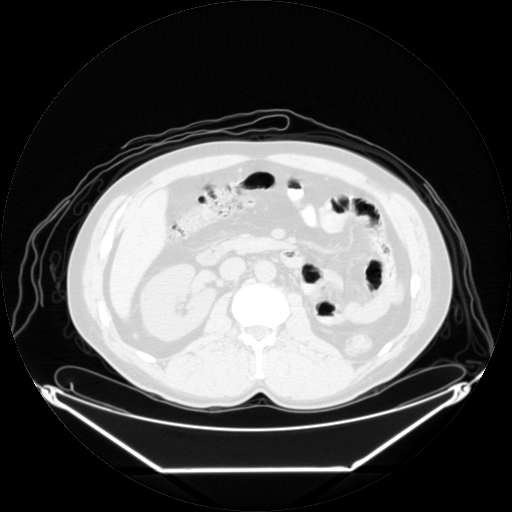
[im 160/266]
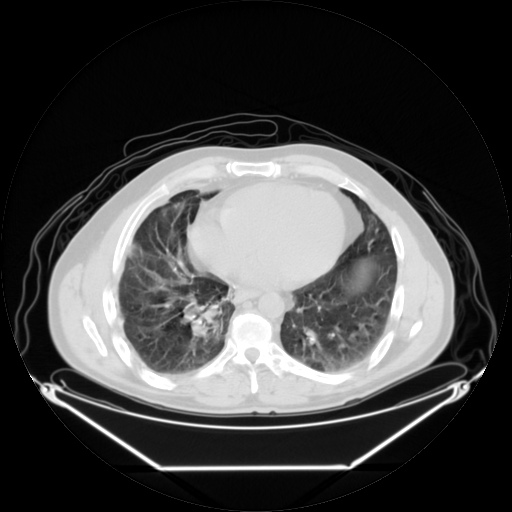
[im 213/266]
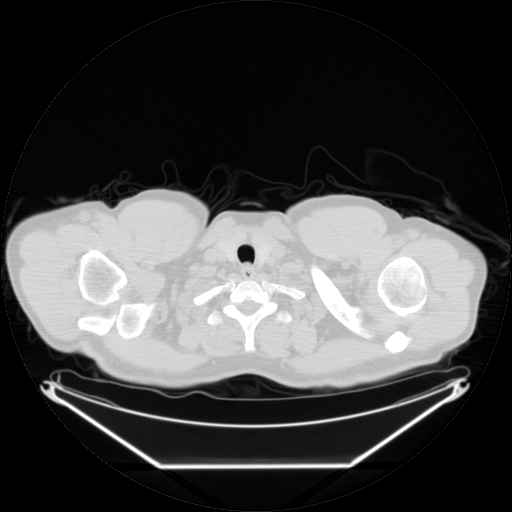
[im 266/266  brain]
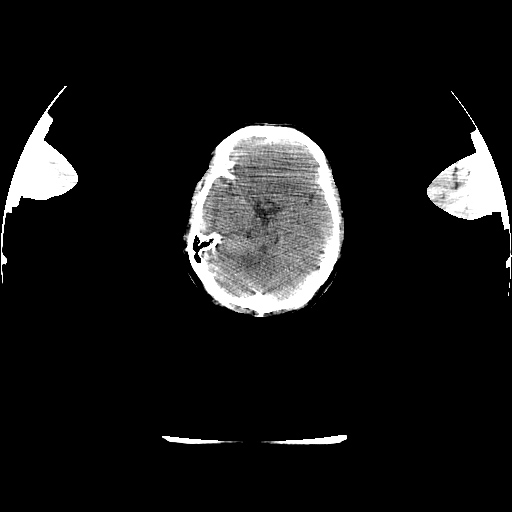

[Series 123: mip · coronal · 3.3mm · 4.69mm/px · 1 of 30 slices shown]
[im 1/30]
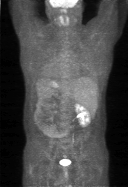

[Series 150: reformatted · axial · 3.3mm · 3.91mm/px · z∈[-883,-13]mm · 6 of 265 slices shown (1 of 2)]
[im 1/265]
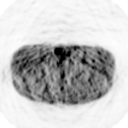
[im 53/265]
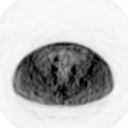
[im 106/265]
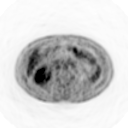
[im 159/265]
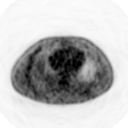
[im 212/265]
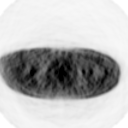
[im 265/265]
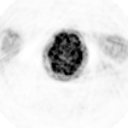

[Series 152: reformatted · coronal · 4.7mm · 6.98mm/px · 1 of 64 slices shown (2 of 2)]
[im 1/64]
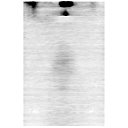

[25 of 25 positions shown; findings below may reference images not displayed]

FINDINGS: No areas of abnormal FDG uptake are seen in the neck,
chest, abdomen or pelvis to suggest metabolically active tumor.
The CT scan demonstrates significant improved appearance of the
right pleural effusion and right lung atelectasis.  There are
scarring changes.  No definite pulmonary nodules or masses.
Surgical changes related to a left nephrectomy are again
demonstrated.
IMPRESSION: Negative PET CT for metabolically active tumor in the neck, chest,
abdomen or pelvis.

## 2011-03-25 ENCOUNTER — Encounter (HOSPITAL_COMMUNITY): Payer: Self-pay | Admitting: Radiology

## 2011-03-25 ENCOUNTER — Inpatient Hospital Stay (HOSPITAL_COMMUNITY): Payer: PRIVATE HEALTH INSURANCE

## 2011-03-25 ENCOUNTER — Emergency Department (HOSPITAL_COMMUNITY): Payer: PRIVATE HEALTH INSURANCE

## 2011-03-25 ENCOUNTER — Observation Stay (HOSPITAL_COMMUNITY)
Admission: EM | Admit: 2011-03-25 | Discharge: 2011-03-27 | DRG: 439 | Disposition: A | Discharge status: Home Health | Payer: PRIVATE HEALTH INSURANCE | Attending: Internal Medicine | Admitting: Internal Medicine

## 2011-03-25 DIAGNOSIS — R112 Nausea with vomiting, unspecified: Secondary | ICD-10-CM | POA: Diagnosis present

## 2011-03-25 DIAGNOSIS — C7800 Secondary malignant neoplasm of unspecified lung: Secondary | ICD-10-CM | POA: Diagnosis present

## 2011-03-25 DIAGNOSIS — R109 Unspecified abdominal pain: Secondary | ICD-10-CM | POA: Diagnosis present

## 2011-03-25 DIAGNOSIS — Z902 Acquired absence of lung [part of]: Secondary | ICD-10-CM

## 2011-03-25 DIAGNOSIS — D7589 Other specified diseases of blood and blood-forming organs: Secondary | ICD-10-CM | POA: Diagnosis present

## 2011-03-25 DIAGNOSIS — R51 Headache: Secondary | ICD-10-CM | POA: Diagnosis present

## 2011-03-25 DIAGNOSIS — W57XXXA Bitten or stung by nonvenomous insect and other nonvenomous arthropods, initial encounter: Secondary | ICD-10-CM | POA: Diagnosis present

## 2011-03-25 DIAGNOSIS — R079 Chest pain, unspecified: Secondary | ICD-10-CM | POA: Diagnosis present

## 2011-03-25 DIAGNOSIS — K869 Disease of pancreas, unspecified: Secondary | ICD-10-CM | POA: Diagnosis present

## 2011-03-25 DIAGNOSIS — Z905 Acquired absence of kidney: Secondary | ICD-10-CM

## 2011-03-25 DIAGNOSIS — K8689 Other specified diseases of pancreas: Principal | ICD-10-CM | POA: Diagnosis present

## 2011-03-25 DIAGNOSIS — R52 Pain, unspecified: Secondary | ICD-10-CM

## 2011-03-25 DIAGNOSIS — E860 Dehydration: Secondary | ICD-10-CM | POA: Diagnosis present

## 2011-03-25 DIAGNOSIS — T148 Other injury of unspecified body region: Secondary | ICD-10-CM | POA: Diagnosis present

## 2011-03-25 DIAGNOSIS — C649 Malignant neoplasm of unspecified kidney, except renal pelvis: Secondary | ICD-10-CM | POA: Diagnosis present

## 2011-03-25 DIAGNOSIS — J309 Allergic rhinitis, unspecified: Secondary | ICD-10-CM | POA: Diagnosis present

## 2011-03-25 DIAGNOSIS — F411 Generalized anxiety disorder: Secondary | ICD-10-CM | POA: Diagnosis present

## 2011-03-25 LAB — COMPREHENSIVE METABOLIC PANEL
ALT: 63 U/L — ABNORMAL HIGH (ref 0–53)
AST: 39 U/L — ABNORMAL HIGH (ref 0–37)
Albumin: 3.5 g/dL (ref 3.5–5.2)
Alkaline Phosphatase: 96 U/L (ref 39–117)
BUN: 14 mg/dL (ref 6–23)
CO2: 28 mEq/L (ref 19–32)
Calcium: 8.9 mg/dL (ref 8.4–10.5)
Chloride: 109 mEq/L (ref 96–112)
Creatinine, Ser: 1.24 mg/dL (ref 0.4–1.5)
GFR calc Af Amer: >60 mL/min (ref >60)
GFR calc non Af Amer: >60 mL/min (ref >60)
Glucose, Bld: 81 mg/dL (ref 70–99)
Potassium: 3.9 mEq/L (ref 3.5–5.1)
Sodium: 141 mEq/L (ref 135–145)
Total Bilirubin: 0.4 mg/dL (ref 0.3–1.2)
Total Protein: 7 g/dL (ref 6.0–8.3)

## 2011-03-25 LAB — CBC
HCT: 43.4 % (ref 39.0–52.0)
Hemoglobin: 14.1 g/dL (ref 13.0–17.0)
MCH: 34.8 pg — ABNORMAL HIGH (ref 26.0–34.0)
MCHC: 32.5 g/dL (ref 30.0–36.0)
MCV: 107.2 fL — ABNORMAL HIGH (ref 78.0–100.0)
Platelets: 112 10*3/uL — ABNORMAL LOW (ref 150–400)
RBC: 4.05 MIL/uL — ABNORMAL LOW (ref 4.22–5.81)
RDW: 14.2 % (ref 11.5–15.5)
WBC: 5.4 10*3/uL (ref 4.0–10.5)

## 2011-03-25 LAB — RAPID URINE DRUG SCREEN, HOSP PERFORMED
Amphetamines: NOT DETECTED
Barbiturates: NOT DETECTED
Benzodiazepines: POSITIVE — AB
Cocaine: POSITIVE — AB
Opiates: NOT DETECTED
Tetrahydrocannabinol: NOT DETECTED

## 2011-03-25 LAB — DIFFERENTIAL
Basophils Absolute: 0 10*3/uL (ref 0.0–0.1)
Basophils Relative: 0 % (ref 0–1)
Eosinophils Absolute: 0.2 10*3/uL (ref 0.0–0.7)
Eosinophils Relative: 3 % (ref 0–5)
Lymphocytes Relative: 29 % (ref 12–46)
Lymphs Abs: 1.6 10*3/uL (ref 0.7–4.0)
Monocytes Absolute: 0.4 10*3/uL (ref 0.1–1.0)
Monocytes Relative: 8 % (ref 3–12)
Neutro Abs: 3.2 10*3/uL (ref 1.7–7.7)
Neutrophils Relative %: 60 % (ref 43–77)

## 2011-03-25 LAB — URINALYSIS, ROUTINE W REFLEX MICROSCOPIC
Bilirubin Urine: NEGATIVE
Glucose, UA: NEGATIVE mg/dL
Hgb urine dipstick: NEGATIVE
Ketones, ur: NEGATIVE mg/dL
Nitrite: NEGATIVE
Protein, ur: NEGATIVE mg/dL
Specific Gravity, Urine: 1.018 (ref 1.005–1.030)
Urobilinogen, UA: 0.2 mg/dL (ref 0.0–1.0)
pH: 6 (ref 5.0–8.0)

## 2011-03-25 LAB — LIPASE, BLOOD: Lipase: 74 U/L — ABNORMAL HIGH (ref 11–59)

## 2011-03-26 LAB — COMPREHENSIVE METABOLIC PANEL
ALT: 51 U/L (ref 0–53)
AST: 29 U/L (ref 0–37)
Albumin: 3.2 g/dL — ABNORMAL LOW (ref 3.5–5.2)
Alkaline Phosphatase: 78 U/L (ref 39–117)
BUN: 8 mg/dL (ref 6–23)
CO2: 26 mEq/L (ref 19–32)
Calcium: 8.1 mg/dL — ABNORMAL LOW (ref 8.4–10.5)
Chloride: 112 mEq/L (ref 96–112)
Creatinine, Ser: 1.12 mg/dL (ref 0.4–1.5)
GFR calc Af Amer: >60 mL/min (ref >60)
GFR calc non Af Amer: >60 mL/min (ref >60)
Glucose, Bld: 97 mg/dL (ref 70–99)
Potassium: 4 mEq/L (ref 3.5–5.1)
Sodium: 141 mEq/L (ref 135–145)
Total Bilirubin: 0.7 mg/dL (ref 0.3–1.2)
Total Protein: 6.4 g/dL (ref 6.0–8.3)

## 2011-03-26 LAB — CBC
HCT: 40.2 % (ref 39.0–52.0)
Hemoglobin: 12.9 g/dL — ABNORMAL LOW (ref 13.0–17.0)
MCH: 34.4 pg — ABNORMAL HIGH (ref 26.0–34.0)
MCHC: 32.1 g/dL (ref 30.0–36.0)
MCV: 107.2 fL — ABNORMAL HIGH (ref 78.0–100.0)
Platelets: 103 10*3/uL — ABNORMAL LOW (ref 150–400)
RBC: 3.75 MIL/uL — ABNORMAL LOW (ref 4.22–5.81)
RDW: 14.1 % (ref 11.5–15.5)
WBC: 5.2 10*3/uL (ref 4.0–10.5)

## 2011-03-26 LAB — LIPASE, BLOOD: Lipase: 22 U/L (ref 11–59)

## 2011-03-26 LAB — TSH: TSH: 1.12 u[IU]/mL (ref 0.350–4.500)

## 2011-03-26 LAB — VITAMIN B12: Vitamin B-12: 381 pg/mL (ref 211–911)

## 2011-03-26 LAB — TRIGLYCERIDES: Triglycerides: 250 mg/dL — ABNORMAL HIGH (ref <150)

## 2011-03-26 LAB — FOLATE: Folate: 6.4 ng/mL

## 2011-03-26 LAB — AMYLASE: Amylase: 34 U/L (ref 0–105)

## 2011-04-01 LAB — GLUCOSE, CAPILLARY: Glucose-Capillary: 97 mg/dL (ref 70–99)

## 2011-04-01 NOTE — Discharge Summary (Signed)
NAME:  Keith [name], Keith [name]               ACCOUNT NO.:  [id number]  MEDICAL RECORD NO.:  [id number]           PATIENT TYPE:  I  LOCATION:  [id number]                         FACILITY:  [place]:  Leana Gamer, MDDATE OF BIRTH:  1966/03/06  DATE OF ADMISSION:  03/25/2011 DATE OF DISCHARGE:  03/27/2011                              DISCHARGE SUMMARY   DISCHARGE DISPOSITION:  Home.  FINAL DISCHARGE DIAGNOSES: 1. Nausea with abdominal pain, resolved. 2. New pancreatic lesion of unclear etiology, to be worked up by Dr.     [name] as an outpatient. 3. History of metastatic renal cancer status post left nephrectomy. 4. Chronic right-sided chest pain, likely related to right lung wedge     resection. 5. Allergic rhinitis. 6. Anxiety.  DISCHARGE MEDICATIONS: 1. Claritin one tablet p.o. daily as needed. 2. Xanax 1 mg p.o. q.8 hours p.r.n. anxiety.  MEDICATIONS DISCONTINUED:  Sutent 50 mg p.o. daily.  CONSULTATIONS:  Firas N. [name], M.D., Oncology.  PROCEDURES:  None.  DIAGNOSTIC STUDIES: 1. CT of the head without contrast which shows normal noncontrast CT     appearance of the brain.  Early metastatic disease of the brain     cannot be excluded in the absence of IV contrast. 2. Portable chest x-ray which shows right lung scarring.  PRIMARY CARE PHYSICIAN:  Lynnell Chad. Shelia Media, M.D.  ONCOLOGIST:  Mathis Dad. [name], M.D.  CODE STATUS:  Full Code.  ALLERGIES: 1. CEPHALOSPORINS. 2. HYDROCODONE/APAP. 3. OXYCODONE/APAP.  CHIEF COMPLAINT:  Dizziness, abdominal pain, nausea.  HISTORY OF PRESENT ILLNESS:  A 45 year old gentleman with a history of metastatic renal cancer on Sutent who started having upper abdominal pain and nausea.  The patient saw his oncologist on March 16 and a CT scan of the abdomen and pelvis showed a lesion in the pancreas.  The patient's pain and nausea worsened and was also accompanied by lightheadedness on the day prior to admission.  The patient had  no syncopal episodes or near syncope.  He presented to the hospital and the [facility] were asked to see the patient for further evaluation and management.  HOSPITAL COURSE:  The patient was admitted and given supportive care. Due to the patient's nausea and vomiting, he was started on clear liquids and advanced to a regular diet, which he tolerated well.  After hydration, the patient's dizziness resolved completely and the patient is presently without symptoms.  It was felt that the patient may have been mildly dehydrated, however, showing only very early clinical signs. It was also felt that the medication Sutent may have contributed to the patient's symptoms.  This was discontinued by Dr. [name] during this hospitalization and he advised the patient to discontinue the Sutent until he sees him in the office on May 5.  At the time of discharge the patient is stable.  He is tolerating his diet well.  He is symptom-free at this time.  PHYSICAL EXAMINATION:  VITAL SIGNS:  Temperature is 97.5, heart rate 65, respiratory rate 20, blood pressure 132/93, O2 saturations are 95% on room air.  HEENT:  The patient is normocephalic, atraumatic.  Pupils are  equally round and reactive to light and accommodation.  Extraocular movements are intact.  Oropharynx moist.  No exudate, erythema or lesions are noted. NECK EXAMINATION:  Trachea is midline.  No masses.  No thyromegaly.  No JVD.  No carotid bruit. ABDOMEN:  Obese, soft, nontender, nondistended.  No masses.  No hepatosplenomegaly noted. EXTREMITIES:  Show no clubbing, cyanosis or edema.  DIETARY RESTRICTIONS:  None.  PHYSICAL RESTRICTIONS:  The patient is advised to have activity in moderation.  FOLLOWUP:  The patient is to follow up with his primary care physician, Dr. Shelia Media, as needed and the patient has an appointment on May 5 to follow up with Dr. [name] in the office for further evaluation of the pancreatic mass and further  treatment of his cancer.  Total time for this patient's discharge process including face-to-face was 35 minutes.     Leana Gamer, MD     MAM/MEDQ  D:  03/27/2011  T:  03/27/2011  Job:  [id number]  cc:   Lynnell Chad. Shelia Media, M.D. Fax: [place]. [name], M.D. Fax: [phone]  Electronically Signed by Liston Alba MD on 04/01/2011 08:58:43 PM

## 2011-04-07 NOTE — H&P (Signed)
NAME:  Keith [name], Keith [name]               ACCOUNT NO.:  [id number]  MEDICAL RECORD NO.:  [id number]           PATIENT TYPE:  I  [id number]                         FACILITY:  [facility]  PHYSICIAN:  Bonnielee Haff, MD     DATE OF BIRTH:  Dec 21, 1966  DATE OF ADMISSION:  03/25/2011 DATE OF DISCHARGE:                             HISTORY & PHYSICAL   PRIMARY CARE PHYSICIAN:  Lynnell Chad. Shelia Media, MD, [facility]  ONCOLOGIST:  Mathis Dad. [name], MD  ADMISSION DIAGNOSES: 1. Nausea with abdominal pain. 2. New pancreatic lesion of unclear etiology. 3. History of metastatic renal cancer, status post left nephrectomy. 4. Chronic right-sided chest pain probably related to the right lung     wedge resection.  CHIEF COMPLAINT:  Dizziness, abdominal pain, nausea.  HISTORY OF PRESENT ILLNESS:  The patient is a 45 year old Caucasian male who has a history of metastatic renal cancer who was in his usual state of health until about 2 weeks ago when he started having upper abdominal pain and some nausea.  He went to see Dr. [name] on March 16, had a CT scan of his abdomen, pelvis, and was found to have a lesion in his pancreas.  It is unclear as to what exactly was planned for further evaluation of this lesion.  However, the patient's pain and nausea started getting worse and yesterday started getting lightheadedness and dizziness.  He did not have any syncopal episodes.  Since these symptoms persisted today, he decided to come into the hospital.  He admits to weight loss of about 10 pounds in the last month and a half.  He has 4 or 5 loose stools on a daily basis which is normal for him ever since he has been on Sutent.  He also mentions right-sided chest pain for the last 2 years ever since he has had the wedge resection for metastatic involvement of his lungs.  He also mentions some degree of shortness of breath and occasional cough with exertion but none currently.  He denies any blood in the  stools.  Denies any leg swelling.  MEDICATIONS AT HOME: 1. Xanax 1 mg every 8 hours as needed for anxiety. 2. Claritin 5 mg daily as needed for allergies. 3. Sutent 50 mg 1 capsule daily.  He takes it for 28 days and has off     for 10 days.  Currently, he is taking it.  ALLERGIES:  Percocet, Vicodin, and Rocephin.  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  He is status post left nephrectomy and right wedge resection from his lungs.  He has had radiation in March or April 2011 and he has been on the Sutent for the last 4-5 months.  He has had a broken arm in the past but denies any other medical problems.  SOCIAL HISTORY:  He lives in [place] with his wife, smokes half pack of cigarettes on a daily basis.  Denies any alcohol use.  Admits to marijuana use occasionally and was found to have cocaine in his urine drug screen.  FAMILY HISTORY:  Positive for breast cancer in  his aunt, history of heart failure in his parents, diabetes in his brother.  REVIEW OF SYSTEMS:  GENERAL:  Positive for weakness, malaise. HEENT:  Unremarkable. CARDIOVASCULAR:  As in HPI. RESPIRATORY:  As in HPI. GASTROINTESTINAL:  As in HPI. GENITOURINARY:  Unremarkable except for some difficulty passing urine for which he was referred to his urologist. NEUROLOGICAL:  Unremarkable except as in HPI.  Other systems reviewed and found to be negative or except as noted in HPI.  PHYSICAL EXAMINATION:  VITAL SIGNS:  Here showed temperature 97.9, heart rate 65, respiratory rate 15, blood pressure 145/94.  In the ED, orthostatics were checked and his blood pressure did not drop with standing up, his heart rate also remains stable. GENERAL:  He is a well-developed, well-nourished white male, in no distress. HEENT:  Head is normocephalic, atraumatic.  Pupils are equal, reacting. No pallor, no icterus.  Oral mucous membranes are slightly dry.  No oral lesions are noted. NECK:   Soft and supple.  No thyromegaly is appreciated. LUNGS:  Reveal coarse respirations bilaterally with a few rhonchi that I appreciate.  No crackles are present.  No wheezing is present. CARDIOVASCULAR:  S1, S2 is normal, regular.  No S3, S4, rubs, murmurs, or bruits.  No pedal edema. ABDOMEN:  Soft.  Tenderness is present in the epigastric area without any rebound, rigidity, or guarding.  No masses or organomegaly is appreciated.  Minimal tenderness in the right upper quadrant is also present but more so in the epigastrium. GENITOURINARY:  Deferred. MUSCULOSKELETAL:  Normal muscle mass and tone. NEUROLOGIC:  He is alert, oriented x3.  No focal neurological deficits are present. SKIN:  Does not reveal any rashes.  He did mention to me that he had a tick removed 2 days ago from the right arm and there is no rash there.  LAB DATA:  His white cell count is 5.4, hemoglobin is 14.1, MCV is 107, platelet count is 112.  His electrolytes are normal.  His AST is 39, ALT is 63, his lipase is 74.  Urine drug screen positive for benzos and cocaine.  UA was unremarkable.  He had a CT scan of his head which did not show any acute pathology or abnormality.  ASSESSMENT:  This is a 45 year old Caucasian male with metastatic renal cancer, who presents with abdominal pain and is found to have pancreatic lesion which needs to be further characterized.  PLAN: 1. Abdominal pain with nausea.  Symptomatic treatment will be provided.      He will likely needs further     evaluation of this pancreatic lesion.  We will probably like to do     MRI to further characterize this; however, I will defer this to Dr.     [name] when he comes to see this patient later today.  His lipase     is elevated.  We will follow these lipase levels. 2. Tick bite, he does not have any rash, will follow it carefully, no     need for antibiotics at this time.  He is afebrile. 3. Chronic right-sided chest pain, will be treated  with pain     medication as needed. 4. Coarse breath sounds with some rhonchi.  We will check chest x-ray. 5. History of metastatic renal cancer.  He will probably need to be     continued on Sutent.  However, I will defer that order to Dr.     [name]. 6. Dehydration.  He will be given IV fluids.  7. Macrocytosis.  We will check TSH level and B12 and folate levels.    Bonnielee Haff, MD     GK/MEDQ  D:  03/25/2011  T:  03/25/2011  Job:  [id number]  cc:   Lynnell Chad. Shelia Media, M.D. Fax: [place]. [name], M.D. Fax: [phone]  Electronically Signed by Bonnielee Haff MD on 04/06/2011 08:31:58 PM

## 2011-04-16 ENCOUNTER — Other Ambulatory Visit: Payer: Self-pay | Admitting: Oncology

## 2011-04-16 ENCOUNTER — Ambulatory Visit (HOSPITAL_COMMUNITY)
Admission: RE | Admit: 2011-04-16 | Discharge: 2011-04-16 | Disposition: A | Discharge status: Home / Self Care | Payer: PRIVATE HEALTH INSURANCE | Source: Ambulatory Visit | Attending: Oncology | Admitting: Oncology

## 2011-04-16 ENCOUNTER — Encounter (HOSPITAL_BASED_OUTPATIENT_CLINIC_OR_DEPARTMENT_OTHER): Payer: PRIVATE HEALTH INSURANCE | Admitting: Oncology

## 2011-04-16 DIAGNOSIS — R079 Chest pain, unspecified: Secondary | ICD-10-CM | POA: Insufficient documentation

## 2011-04-16 DIAGNOSIS — R52 Pain, unspecified: Secondary | ICD-10-CM

## 2011-04-16 DIAGNOSIS — J984 Other disorders of lung: Secondary | ICD-10-CM | POA: Insufficient documentation

## 2011-04-16 DIAGNOSIS — C649 Malignant neoplasm of unspecified kidney, except renal pelvis: Secondary | ICD-10-CM

## 2011-04-16 DIAGNOSIS — C7800 Secondary malignant neoplasm of unspecified lung: Secondary | ICD-10-CM

## 2011-04-16 DIAGNOSIS — M79609 Pain in unspecified limb: Secondary | ICD-10-CM | POA: Insufficient documentation

## 2011-04-16 DIAGNOSIS — Z85118 Personal history of other malignant neoplasm of bronchus and lung: Secondary | ICD-10-CM | POA: Insufficient documentation

## 2011-04-16 DIAGNOSIS — C801 Malignant (primary) neoplasm, unspecified: Secondary | ICD-10-CM

## 2011-04-16 DIAGNOSIS — Z8553 Personal history of malignant neoplasm of renal pelvis: Secondary | ICD-10-CM | POA: Insufficient documentation

## 2011-04-25 ENCOUNTER — Other Ambulatory Visit: Payer: Self-pay | Admitting: Oncology

## 2011-04-25 ENCOUNTER — Encounter (HOSPITAL_BASED_OUTPATIENT_CLINIC_OR_DEPARTMENT_OTHER): Payer: PRIVATE HEALTH INSURANCE | Admitting: Oncology

## 2011-04-25 DIAGNOSIS — C801 Malignant (primary) neoplasm, unspecified: Secondary | ICD-10-CM

## 2011-04-25 DIAGNOSIS — C649 Malignant neoplasm of unspecified kidney, except renal pelvis: Secondary | ICD-10-CM

## 2011-04-25 DIAGNOSIS — C7800 Secondary malignant neoplasm of unspecified lung: Secondary | ICD-10-CM

## 2011-04-25 LAB — CBC WITH DIFFERENTIAL/PLATELET
BASO%: 0.2 % (ref 0.0–2.0)
Basophils Absolute: 0 10*3/uL (ref 0.0–0.1)
EOS%: 1.7 % (ref 0.0–7.0)
Eosinophils Absolute: 0.1 10*3/uL (ref 0.0–0.5)
HCT: 41.2 % (ref 38.4–49.9)
HGB: 14.3 g/dL (ref 13.0–17.1)
LYMPH%: 25.6 % (ref 14.0–49.0)
MCH: 36.2 pg — ABNORMAL HIGH (ref 27.2–33.4)
MCHC: 34.6 g/dL (ref 32.0–36.0)
MCV: 104.5 fL — ABNORMAL HIGH (ref 79.3–98.0)
MONO#: 0.3 10*3/uL (ref 0.1–0.9)
MONO%: 4.4 % (ref 0.0–14.0)
NEUT#: 4.6 10*3/uL (ref 1.5–6.5)
NEUT%: 68.1 % (ref 39.0–75.0)
Platelets: 159 10*3/uL (ref 140–400)
RBC: 3.94 10*6/uL — ABNORMAL LOW (ref 4.20–5.82)
RDW: 14 % (ref 11.0–14.6)
WBC: 6.8 10*3/uL (ref 4.0–10.3)
lymph#: 1.7 10*3/uL (ref 0.9–3.3)

## 2011-04-25 LAB — COMPREHENSIVE METABOLIC PANEL
ALT: 62 U/L — ABNORMAL HIGH (ref 0–53)
AST: 34 U/L (ref 0–37)
Albumin: 4.3 g/dL (ref 3.5–5.2)
Alkaline Phosphatase: 103 U/L (ref 39–117)
BUN: 13 mg/dL (ref 6–23)
CO2: 24 mEq/L (ref 19–32)
Calcium: 8.7 mg/dL (ref 8.4–10.5)
Chloride: 108 mEq/L (ref 96–112)
Creatinine, Ser: 1.36 mg/dL (ref 0.40–1.50)
Glucose, Bld: 97 mg/dL (ref 70–99)
Potassium: 4.3 mEq/L (ref 3.5–5.3)
Sodium: 140 mEq/L (ref 135–145)
Total Bilirubin: 0.4 mg/dL (ref 0.3–1.2)
Total Protein: 7.6 g/dL (ref 6.0–8.3)

## 2011-05-06 NOTE — Op Note (Signed)
NAME:  Keith [name], Keith [name]               ACCOUNT NO.:  [id number]   MEDICAL RECORD NO.:  [id number]          PATIENT TYPE:  INP   LOCATION:  [id number]                         FACILITY:  [facility]   PHYSICIAN:  [name], MD      DATE OF BIRTH:  02/03/1966   DATE OF PROCEDURE:  08/07/2008  DATE OF DISCHARGE:                               OPERATIVE REPORT   PREOPERATIVE DIAGNOSES:  1. Left renal mass.  2. Gross hematuria.   POSTOPERATIVE DIAGNOSES:  1. Left renal mass.  2. Gross hematuria.   PROCEDURES:  1. Flexible cystoscopy.  2. Left laparoscopic radical nephrectomy.   SURGEON:  Dr. [name].   ASSISTANT:  1. Dr. Magnus Ivan.  2. Franco Collet, nurse practitioner.   ANESTHESIA:  General.   COMPLICATIONS:  None.   ESTIMATED BLOOD LOSS:  150 mL.   SPECIMENS:  Left kidney and proximal ureter.   DISPOSITION:  Specimen to pathology.   INDICATIONS:  Keith [name] is a 45 year old gentleman who recently  presented with left-sided flank pain and gross hematuria.  He underwent  an evaluation including a CT scan which demonstrated a large left renal  mass which under further evaluation was found to be enhancing and  concerning for a large renal cell carcinoma.  He also underwent a CT of  his chest due to an abnormal chest x-ray which demonstrated a  questionable 1 cm right lower lobe pulmonary lesion concerning for  possible metastatic disease.  No other areas were identified that were  suspicious for metastatic disease on his evaluation.  After discussing  management options, he elected to proceed with the above procedures.  The potential risks, complications, and alternative options were  discussed with the patient in detail and informed consent was obtained.   DESCRIPTION OF PROCEDURE:  The patient was taken to the operating room  and a general anesthetic was administered.  He was given preoperative  antibiotics, placed in the supine position and prepped and draped in the  usual sterile fashion.  Flexible cystourethroscopy was then performed  which demonstrated a normal urethra.  Examination of the bladder  systematically demonstrated no bladder tumors, stones, or other mucosal  pathology.  The ureteral orifices were identified in the normal anatomic  position and effluxing clear urine.  The flexible cystoscope was then  removed and the patient was repositioned in the left modified flank  position with care to pad all potential pressure points.  Again, a  preoperative time-out was performed.  A site was then selected just  superior to the umbilicus in the midline for placement of the camera  port.  This was placed using a standard open Hassan technique which  allowed entry into the peritoneal cavity under direct vision without  difficulty.  Vicryl 0 holding stitches were placed in the fascia and a  10-mm Hassan cannula was placed and secured with the fascial sutures.  A  pneumoperitoneum was created and the 30 degrees lens was used to inspect  the abdomen.  There were noted be some minor adhesions between the  omentum and the abdominal wall in the  left upper quadrant.  However, no  other abnormalities and no intra-abdominal injuries were noted.  A 12 mm  port was then placed in the left lower quadrant and a 5 mm port placed  in the right upper quadrant with an additional 5 mm port placed in the  lateral abdominal wall for laparoscopic assistance.  All ports were  placed under direct vision without difficulty.  The harmonic scalpel was  then used to take down the previously mentioned omental adhesions.  The  white line of Toldt was then incised along the length of the ascending  colon, allowing the colon to be mobilized medially in the space between  the anterior layer of Gerota's fascia and the colon mesentery to be  developed.  The gonadal vein was identified inferiorly and was lifted  anteriorly off the psoas muscle.  The ureter was then identified   slightly medially and it also was able to be lifted anteriorly off the  psoas muscle.  The dissection then proceeded superiorly following the  gonadal vein up toward the renal hilum.  There were small tributaries of  the gonadal vein which were identified, isolated with right-angle  dissection and divided between multiple 5-mm Hem-o-lok clips.  The renal  vein was then identified and a solitary renal artery was noted just  superior to the renal vein.  It was able to be isolated with right-angle  dissection and was able to be divided after ligation with multiple 10-mm  Hem-o-lok clips.  The renal vein was noted to go flat and right-angle  dissection was used to isolate the renal vein from the posteriorly  located fatty tissue.  The 45 mm flex ETS stapler with a vascular staple  load was then used to staple and divide the renal vein.  The tumor was  noted off the posterior aspect of the upper pole of the kidney.  However, it did not appear to be extending toward the adrenal gland on  imaging or intraoperatively.  Gerota's fascia was incised next to the  adrenal gland and the fatty tissue between the adrenal gland and the  kidney was then divided with the harmonic scalpel.  The upper pole  attachments between the liver and the kidney were then similarly taken  down with the harmonic scalpel.  Attention then returned to the lower  pole.  The ureter and gonadal vein were isolated and divided between  multiple 10-mm Hem-o-lok clips respectively.  The fatty tissue off the  lateral sidewall was then divided as well until the kidney was freely  mobile.  The kidney was then placed into a 15 mm EndoCatch II bag via  the original camera port site.  A preplaced 0 Vicryl suture was placed  through the fascia of the 12 mm port site in the left lower quadrant and  the camera port site was extended inferiorly in a periumbilical fashion.  The kidney was then removed intact within the Endopouch retrieval  bag  and this fascial opening was then closed with a running #1 PDS suture.  The pneumoperitoneum was then reestablished and the left lower quadrant  12-mm port was used to place the camera.  No obvious bleeding was noted.  There was noted to be a mesenteric window which was closed with 5 mm Hem-  o-lok clips.  At this point the remaining ports were removed under  direct vision.  The 12 mm port was removed with the fascia closed with  the already preplaced 0 Vicryl suture.  All port sites were injected  with quarter-percent Marcaine and reapproximated at the skin level with  staples.  The pneumoperitoneum had been expelled prior to removal of the  final 12 mm port.  The skin was reapproximated with staples and sterile  dressings were applied.  The patient appeared to tolerate the procedure  well and without complications.  He was able to be extubated and  transferred to the recovery unit in satisfactory condition.      [name], MD  Electronically Signed    LB/MEDQ  D:  08/07/2008  T:  08/08/2008  Job:  [id number]

## 2011-05-06 NOTE — H&P (Signed)
HISTORY AND PHYSICAL EXAMINATION   September 18, 2008   Re:  Haymond, Fahed D         DOB:  March 29, 1966   HISTORY OF PRESENT ILLNESS:  The patient returns to the office for  further followup of right lung nodule.  He was originally seen in  consultation on August 17, 2008.  Since then, he has done well  clinically.  He has now virtually completely recovered from his recent  nephrectomy.  He has minimal residual soreness and he is getting around  quite well.  He denies any chest pain.  He denies any shortness of  breath.  He has no cough.  His appetite is good.  The remainder of his  review of systems is unremarkable and unchanged from previously.  The  remainder of his physical exam is unchanged.   CURRENT MEDICATIONS:  None.   DRUG ALLERGIES:  Vicodin causes swelling and Rocephin causes red streaks  in the arm when injured, administered intravenously through a poor IV.   PHYSICAL EXAMINATION:  GENERAL:  Notable for well-appearing male who  appears of stated age.  VITAL SIGNS:  Blood pressure is 141/94, pulse 68, and oxygen saturation  97% on room air.  HEENT:  Unrevealing.  NECK:  There is no palpable lymphadenopathy.  There are no carotid  bruits.  CHEST:  Auscultation of the chest demonstrates clear breath sounds,  which are symmetrical bilaterally.  CARDIOVASCULAR:  Notable for regular rate and rhythm.  No murmurs, rubs,  or gallops are noted.  ABDOMEN:  Soft and nontender.  The surgical incision have healed nicely.  EXTREMITIES:  Warm and well perfused.  There is no lower extremity  edema.  Distal pulses are palpable.  The remainder of his physical exam is unrevealing.   DIAGNOSTIC TESTS:  Chest CT scan performed today at the [facility] is reviewed and compared with last previous CT scan from  July 05, 2008.  The nodule at the base of the right lung persists.  It  may have gotten slightly bigger in size, although the difference in size  is probably negligible.  Nevertheless, this nodule has not gone away and  with IV contrast, it does appear to represent a discrete abnormality  that is worrisome for a possible isolated solitary pulmonary metastasis.  There are no other pulmonary parenchymal nodules identified.  The  remainder of the exam is entirely normal and the abdominal CT scan is  normal as well with normal postoperative findings following recent  nephrectomy.   IMPRESSION:  Persistent right lower lobe nodule suspicious for possible  small pulmonary metastasis from renal cell primary.  This could  represent benign nodule, but it may have gotten slightly bigger in size  during the interval period of time and the characteristics are certainly  suspicious enough to warrant surgical biopsy.   PLAN:  I have discussed options at length with the patient and his wife  including continued conservative management and  followup versus  proceeding with surgery for definitive diagnosis.  We tentatively plan  to proceed with surgery on Tuesday September 26, 2008, for right VATS and  for wedge resection of right lower lobe.  I doubt, this represents a  primary lung cancer, but in the event that it did, we would complete a  lobectomy as needed.  The patient understands and accepts all potential  associated risks of surgery including, but not limited to risk of death,  myocardial infarction, pneumonia, respiratory failure, pulmonary  embolus, bleeding requiring blood transfusion, late recurrence of  underlying malignancy, prolonged air leak, and prolonged chest wall  pain.  All of their questions have been addressed.   Valentina Gu. Roxy Manns, M.D.  Electronically Signed   CHO/MEDQ  D:  09/18/2008  T:  09/19/2008  Job:  [id number]   cc:   [name], MD  Bernestine Amass, M.D.

## 2011-05-06 NOTE — Consult Note (Signed)
NEW PATIENT CONSULTATION   Keith [name]  [name]:  03/06/1966                                        August 17, 2008  CHART #:  [id number]   REASON FOR CONSULTATION:  Right lung nodule.   HISTORY OF PRESENT ILLNESS:  The patient is a 45 year old previously  healthy male with longstanding history of tobacco use who was in his  usual state of health until early July when he developed left-sided  flank pain and gross hematuria.  He presented to the emergency  department and underwent a CT scan for renal stone protocol and was  found to have a large mass in the left kidney.  He was initially  evaluated by Dr. Risa Grill and a chest x-ray and CT scan of the chest and  abdomen was performed on 07/05/2008.  This confirmed the large mass in  the left kidney consistent with primary renal cell carcinoma.  There is  also a small 1-cm noncalcified indeterminate nodule in the right lower  lobe.  The patient was referred to Dr. Raynelle [name], and subsequently,  underwent laparoscopic left nephrectomy on 07/07/2008.  This procedure  was uncomplicated.  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  All  surgical margins were free of cancer.  There was no identified extension  through the capsule.  The patient has continued to recover uneventfully  following the surgical procedure and he is referred to the  Multidisciplinary Thoracic Oncology Clinic today for further evaluation  and management of this indeterminate right lower lobe lung nodule.   REVIEW OF SYSTEMS:  GENERAL:  The patient reports feeling well.  He is  gradually gaining his energy back after his recent surgery.  His  appetite is good.  CARDIAC:  Negative.  The patient has no exertional chest pain or  shortness of breath.  RESPIRATORY:  Negative.  The patient denies productive  cough,  hemoptysis, and wheezing.  GASTROINTESTINAL:  Negative.  The patient has no difficulty swallowing.  He reports normal bowel function.  His abdomen is still somewhat sore  from his recent surgery.  GENITOURINARY:  Negative.  He reports no further hematuria.  He has no  pain with urination.  MUSCULOSKELETAL:  Negative.  HEENT:  Negative.  NEUROLOGIC:  Negative.   PAST MEDICAL HISTORY:  Unremarkable and notable for the absence of any  known history of hypertension, diabetes, or heart disease.   PAST SURGICAL HISTORY:  Right hand surgery in the distant past for  trauma.   MEDICATIONS:  Oxycodone for pain and a stool softener.   DRUG ALLERGIES:  Vicodin causes hives and Rocephin causes red streaks in  his arms.   FAMILY HISTORY:  Notable for the absence of any family members with lung  cancer or renal cell carcinoma.   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  Lives in [place].  He has smoked 2 packs of cigarettes per day for 25  years, but quit smoking at the time of his recent nephrectomy.   PHYSICAL EXAMINATION:  General:  The patient is a well-appearing young  and otherwise healthy male who appears of stated age in no acute  distress.  Vital Signs:  Blood  pressure 149/88, pulse 110 and regular,  and oxygen saturation 96% on room air.  HEENT:  Unrevealing.  Neck:  Supple.  There is no palpable lymphadenopathy.  Chest:  Auscultation of  the chest demonstrates clear breath sounds, which are symmetrical  bilaterally.  No wheezes or rhonchi noted.  Cardiovascular:  Regular  rate and rhythm.  No murmurs, rubs, or gallops are noted.  Abdomen:  Notable for a small midline laparotomy incision around the umbilicus  with several small port incisions, all with skin staples in place.  All  incisions appear to be healing appropriately.  The abdomen is soft,  nondistended, and nontender.  Bowel sounds are present.  Extremities:  Warm and well perfused.   There is no lower extremity edema.  Pulses are  palpable at the ankle.  Skin:  Clean, dry, and healthy appearing  throughout.  Rectal and GU:  Both deferred.  Neurologic:  Grossly  nonfocal.   DIAGNOSTIC TESTS:  CT scan of the chest and abdomen performed on  07/05/2008, at [facility] is reviewed.  This demonstrates a large  mass in the left kidney consistent with primary renal cell carcinoma.  There is a small approximately 1 cm, noncalcified, irregular opacity  located posteriorly in the right lower lobe.  It is difficult to  appreciate on this scan, but this could be simple blood vessels on end.  This is not a high-resolution CT scan.  This could be a small metastatic  nodule, but if it is such, it is quite small.  No other nodular  opacities are appreciated.  There are some small hypodense lesions in  the liver that probably represent small benign cysts.  No other  abnormalities are noted.   IMPRESSION:  Small subcentimeter noncalcified nodular opacity located  peripherally in the right lower lobe posteriorly.  The patient just had  left radical nephrectomy for renal cell carcinoma.  There was renal vein  involvement on the final pathology.  This small nodule could represent a  very small metastasis, although I am not convinced by the CT scan that  there is anything pathologic present and this could simply be vessels on  end.  I think a repeat CT scan is needed.  PET CT would not be helpful  due to the small size of these lesions and the fact that often times  renal cell carcinomas are not hypermetabolic.   PLAN:  This patient's case was discussed in the Multidisciplinary  Thoracic Oncology Clinic.  We will plan to have the patient return in 4  weeks' time for a repeat high-resolution CT scan with IV contrast.  If  there remains a suspicious appearing nodule in the right lower lobe, he  would certainly be an excellent candidate for an aggressive surgical  approach.    Keith [name], M.[name].  Electronically Signed   CHO/MEDQ  [name]:  08/17/2008  T:  08/18/2008  Job:  [id number]   cc:   Keith Bring, MD  Keith [name], M.[name].

## 2011-05-06 NOTE — Assessment & Plan Note (Signed)
OFFICE VISIT   [name], [name]  DOB:  July 29, 1966                                        December 25, 2008  CHART #:  [id number]   HISTORY OF PRESENT ILLNESS:  The patient returns for further followup  status post right thoracotomy for wedge resection of metastatic renal  cell carcinoma on September 26, 2008.  The patient was last seen here in  the office on October 23, 2008.  Since then, he has done well.  The  soreness in his right chest has now almost completely resolved.  He  feels some mild numbness and hypersensitivity anteriorly in the nerve  root distribution of the interspace through which we went for surgery.  He otherwise has minimal soreness and he is now nearly back to normal  physical activity.  He denies any fevers, chills, or productive cough.  His appetite is normal.  He is getting along quite well.  The remainder  of his review of systems is unremarkable.   PAST MEDICAL HISTORY:  Unchanged.   CURRENT MEDICATIONS:  Unchanged from previously.   PHYSICAL EXAMINATION:  GENERAL:  Notable for a well-appearing male.  VITAL SIGNS:  Blood pressure 140/92, pulse 79, and oxygen saturation 98%  on room air.  HEENT:  Unrevealing.  NECK:  There is no palpable lymphadenopathy.  CHEST:  Small right posterolateral thoracotomy incision that has healed  completely.  Breath sounds are clear to auscultation and symmetrical  bilaterally.  CARDIOVASCULAR:  Demonstrates regular rate and rhythm.  No murmurs,  rubs, or gallops are noted.  ABDOMEN:  Soft, nontender.  There are no palpable masses.  EXTREMITIES:  Warm and well perfused.  The remainder of his physical exam is noncontributory.   DIAGNOSTIC TEST:  Chest CT scan performed on December 18, 2008, is  reviewed.  This demonstrates that the previous right lower lobe nodule  has been surgically resected.  There remains very small loculated pocket  and the pleural fluid on the right side.  The lungs are  otherwise clear  and there are no other suspicious pulmonary nodules.  There is one  slightly enlarged lymph node in the vicinity of the surgery along the  esophagus inferiorly.  This is likely a reactive lymph node.  Of note,  abdomen and pelvis CT scan was also free of any sign of locally  recurrent or distant metastatic disease.   IMPRESSION:  The patient appears to be doing quite well following wedge  resection of metastatic renal cell carcinoma in the right lower lobe.  His blood pressure is running slightly high again and he may need to  have some further attention to long-term antihypertensive therapy.  We  will defer this to his primary care physician.   PLAN:  We will see the patient back in next fall.  He plans to follow up  with Dr. [name] in the spring for another round and follow up with a PET  CT scan at that time.  The patient will call or return to see [place] here  sooner should he have any further questions or concerns regarding  findings on subsequent CT or PET scans of the chest.   Valentina Gu. Roxy Manns, M.D.  Electronically Signed   CHO/MEDQ  D:  12/25/2008  T:  12/25/2008  Job:  [id number]   cc:   [name]  [name], MD  Bernestine Amass, M.D.

## 2011-05-06 NOTE — Discharge Summary (Signed)
NAME:  Keith [name], Keith [name]               ACCOUNT NO.:  [id number]   MEDICAL RECORD NO.:  [id number]          PATIENT TYPE:  INP   LOCATION:  2025                         FACILITY:  [facility]   PHYSICIAN:  Valentina Gu. Roxy Manns, M.D. DATE OF BIRTH:  May 19, 1966   DATE OF ADMISSION:  09/26/2008  DATE OF DISCHARGE:                               DISCHARGE SUMMARY   HISTORY OF PRESENT ILLNESS:  The patient is a 45 year old gentleman  previously healthy with long-standing history of tobacco use.  He was in  his usual state of health until early July when he developed a left-  sided flank pain and gross hematuria.  He presented to the Emergency  Department and underwent a CT scan for renal stone protocol and was  found to have a large mass in the left kidney.  He was initially  evaluated by Dr. Risa Grill.  He had chest x-ray and CT scan of the chest  and the abdomen were performed on July 05, 2008.  This confirmed the  finding of a large mass in the left kidney consistent with a primary  renal cell carcinoma.  In addition, there was a 1-cm noncalcified  indeterminate nodule on the right lower lobe.  The patient was referred  to Dr. Alinda Money and subsequently underwent a laparoscopic left nephrectomy  on July 07, 2008.  The procedure was noted to be uncomplicated.  Final  pathology of the resected specimens were notable for left radical  nephrectomy with clear cell renal cell carcinoma.  This was affirming  Fuhrman grade 3/4, expanding 8.5 cm with renal vein involvement.  All  surgical margins were free of carcinoma.  There was no identified  extension through the capsule.  The patient recovered uneventfully  following the surgery and was referred to the [facility] for further evaluation and management of the right lower  lobe lung nodule.  He was seen by Dr. Darylene Price in consultation.  The patient was initially seen on August 17, 2008.  They felt the need  to have him return  in 4 weeks for repeat high resolution CT scan with IV  contrast.  The nodule would be reevaluated at that time.  This was done.  The right lower lobe nodule remained suspicious for a small pulmonary  metastasis from the renal cell primary.  It was felt to be prudent to  proceed with resection and he was admitted at this hospitalization for  the procedure.   MEDICATIONS:  Prior to admission included, Xanax 0.5 mg q.8 h. p.r.n.  and Allegra over-the-counter p.r.n.   ALLERGIES:  VICODIN, which causes swelling and he had intolerance to  ROCEPHIN, where he developed phlebitis and lymphangitis, although this  was probably felt to be related to an extravasation and infiltration of  a poor IV site.   PAST MEDICAL HISTORY:  Otherwise unremarkable.   PAST SURGICAL HISTORY:  Includes a right hand surgery in the distant  past for trauma.   FAMILY HISTORY, SOCIAL HISTORY, REVIEW OF SYSTEMS, AND PHYSICAL EXAM:  Please see the history and physical done at the time of  admission.   HOSPITAL COURSE:  The patient was admitted electively on September 26, 2008.  He underwent a right video-assisted thoracoscopy with mini  thoracotomy with right lower lobe wedge resection x2.  The second  specimen revealed a negative margin.  Pathology has revealed a  metastatic clear cell renal cell carcinoma.  The patient tolerated the  procedure well and was taken to the postanesthesia care unit in stable  condition.   POSTOPERATIVE HOSPITAL COURSE:  The patient has done quite well.  All  routine lines, monitors, and drainage devices have been discontinued in  the standard fashion.  Laboratory values are stable.  Most recent  hemoglobin and hematocrit dated September 28, 2008, are 11.6 and 33.2.  Chemistries for the same day reveals sodium 135, potassium 4.3, chloride  101, CO2 of 27, BUN 12, creatinine 1.42, and glucose 106.  The patient's  incision is healing without evidence of infection.  He is tolerating  gradual  increase in activities using standard protocols.  Oxygen has  been weaned and he maintained good saturations on room air.  His overall  status is felt to be tentatively stable for discharge on September 30, 2008.  Pending morning evaluation.   MEDICATIONS ON DISCHARGE:  1. For pain, Ultram 50 mg 1-2 every 6 hours as needed.  2. Lopressor 25 mg twice daily for postoperative sinus tachycardia.  3. Xanax 0.5 mg q.8 h. p.r.n. as previously.  4. Allegra over the counter as needed previously.   CONDITION ON DISCHARGE:  Stable and improving.   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  Previous left nephrectomy.  Other diagnosis is listed per the  history.   FOLLOWUP:  Include a nurse appointment at the office to remove these  chest tubes and sutures on October 06, 2008.  Additionally, he will see  Dr. Roxy Manns on October 23, 2008, at 1:45.   INSTRUCTIONS:  The patient received written instructions in regard to  medications, activity, diet, wound care, and followup.      [name], P.A.-C.      Valentina Gu. Roxy Manns, M.D.  Electronically Signed    WEG/MEDQ  D:  09/29/2008  T:  09/30/2008  Job:  [id number]   cc:   Valentina Gu. Roxy Manns, M.D.  [name], MD

## 2011-05-06 NOTE — Assessment & Plan Note (Signed)
OFFICE VISIT   Keith [name], Keith [name]  DOB:  1966-06-12                                        October 23, 2008  CHART #:  [id number]   HISTORY OF PRESENT ILLNESS:  The patient returns for routine followup  status post right thoracotomy for wedge resection of right lower lobe  metastatic renal cell carcinoma on September 26, 2008.  The patient's  postoperative recovery has been uneventful.  Following hospital  discharge, he has progressed fairly well.  He has been seen in followup  by his oncologist, Dr. [name].  Plans have been made for followup of his  chest, abdomen and pelvis CT scan in December.  The patient is not  planning to undergo any adjuvant chemotherapy.  Overall, he has  progressed fairly well.  He still has pain and soreness in his right  chest and his pain is more neuropathic in character now radiating around  anteriorly and associated with some hypersensitivity of the skin.  He  has not had any shortness of breath.  He has not had any productive  cough.  He is eating quite well.  He has no other complaints.  The  remainder of his review of systems is unremarkable.  The remainder of  his past medical history is unchanged.   PHYSICAL EXAMINATION:  GENERAL:  Notable for a well-appearing male.  VITAL SIGNS:  Blood pressure 134/93, pulse 62, and oxygen saturation 96%  on room air.  CHEST:  Reveals a small right posterolateral thoracotomy incision that  has healed nicely.  Breath sounds are clear to auscultation.  There are  expected decreased breath sounds at the right lung base.  No wheezes or  rhonchi are noted.  CARDIOVASCULAR:  Demonstrates regular rate and rhythm.  ABDOMEN:  Soft and nontender.  EXTREMITIES:  Warm and well perfused.  The remainder of his physical exam is unrevealing.   DIAGNOSTIC TEST:  Chest [facility] obtained [facility] at [facility] is reviewed and compared with the last previous exam from September 30, 2008.  This  demonstrates continued interval improvement in the  aeration of the right lung base.  There is some expected persistent  opacity at the right base consistent with recent resection of a large  portion of the right lower lobe.  There are no new pulmonary parenchymal  lesions.  No other abnormalities are noted.   IMPRESSION:  Satisfactory progress following recent right thoracotomy  for right lower lobectomy for metastatic renal cell carcinoma.  The  patient is progressing well.  He still has considerable soreness with  some postoperative pain that is consistent with neuropathic post  thoracotomy pain at this point in time.  He is otherwise doing fine.   PLAN:  I have given the patient a refill prescription for oxycodone to  use as necessary for pain.  We will also start him on Neurontin 300 mg  p.o. twice daily.  We will plan to see him back the first week of  January.  If he is doing well at that point in time, we will consider  whether or not he can return to work.  We will also review the results  of the CT scans scheduled to be performed through Dr. Hazeline Junker office  sometime in December.  All of his questions have been addressed.  For  the time being, I think he should probably stay on low-dose Toprol as he  still has a tendency towards high blood pressure.  Ultimately, he may  need primary care physician to address hypertension, if this persists in  a long run.   Valentina Gu. Roxy Manns, M.[name].  Electronically Signed   CHO/MEDQ  [name]:  10/23/2008  T:  10/23/2008  Job:  [id number]   cc:   [name], MD  Bernestine Amass, M.[name].  Mathis Dad[employer]

## 2011-05-06 NOTE — Op Note (Signed)
NAME:  Keith [name], Keith [name]               ACCOUNT NO.:  [id number]   MEDICAL RECORD NO.:  [id number]          PATIENT TYPE:  INP   LOCATION:  3308                         FACILITY:  [facility]   PHYSICIAN:  Valentina Gu. Roxy Manns, M.D. DATE OF BIRTH:  Nov 24, 1966   DATE OF PROCEDURE:  09/26/2008  DATE OF DISCHARGE:                               OPERATIVE REPORT   PREOPERATIVE DIAGNOSIS:  Right lung mass.   POSTOPERATIVE DIAGNOSIS:  Metastatic renal cell carcinoma.   PROCEDURE:  Right video-assisted thoracoscopy with mini thoracotomy for  wedge resection, right lower lobe.   SURGEON:  Valentina Gu. Roxy Manns, MD   ASSISTANT:  Lars Pinks, PA   ANESTHESIA:  General.   BRIEF CLINICAL NOTE:  The patient is a 45 year old gentleman recently  diagnosed with renal cell carcinoma.  The patient underwent left radical  nephrectomy by Dr. [name] on July 07, 2008.  Final pathology was  notable for clear renal cell carcinoma with renal vein involvement.  All  surgical margins were free of cancer.  Abdominal CT scan performed prior  to surgery revealed a small nodule inferiorly in the right lower lobe of  the right lung.  The patient was referred for thoracic surgical  consultation and a follow up chest CT scan was performed on September 18, 2008.  This confirmed the presence of a persistent noncalcified  nodule in the right lower lobe that was indeterminate, but nonetheless  potentially consistent with metastatic renal cell carcinoma.  No other  pulmonary parenchymal lesions were noted.  The patient now presents for  definitive surgical intervention for biopsy and possible treatment.  The  patient and his wife have been counseled regarding the indications,  risks, and potential benefits of surgery.  Alternative treatment  strategies have been discussed.  He understands and accepts all  potential associated risks and desires to proceed as described.   OPERATIVE NOTE DETAIL:  The patient was brought to  the operating room on  the above-mentioned date and a central venous catheter and radial  arterial line are placed by the anesthesia team under the care and  direction of Dr. Lillia Abed.  Intravenous antibiotics were  administered.  Pneumatic sequential compression loops were placed on  both lower extremities.  General endotracheal anesthesia is induced  uneventfully.  A Foley catheter was placed.  The patient was turned to  the left lateral decubitus position using an axillary roll and pneumatic  bean bag device to facilitate positioning.  Single lung ventilation is  begun.  The patient's right chest was prepared and draped in sterile  manner.   A small incision was made overlying the seventh intercostal space in the  anterior axillary line.  The incision was completed through the  subcutaneous tissues and intercostal musculature with electrocautery.  The right pleural space was entered bluntly.  A 10-mm port was passed  through the incision and a 30-degree endoscopic video camera was passed  through the port.  The right chest was explored visually.  The lung was  collapsed.  There were no visual abnormalities appreciated.  There was  no pleural  effusion.  The lung appeared essentially normal with mild  apposition of anthracosis.  Two additional port incisions were placed  including one located posteriorly through the seventh intercostal space  and one located anteriorly through the fourth intercostal space.  The  lung was gently retracted and the right lower lobe was palpated using a  single digit in search of the nodule in question.  With this technique,  no palpable nodules were identified.  The small posterior incision was  then extended for several centimeters to allow further exploration.  Once this has been completed, the inferior pulmonary ligament was  divided to facilitate mobility of the right lower lobe.  One can palpate  a firm nodule located posteriorly in the right lower  lobe in the area  consistent with the known radiographic abnormality seen on preoperative  CT scan.  Wedge resection of this nodule was performed using several  fires of the endoscopic GIA stapling device.  The specimen was removed  and sent to Pathology for frozen section histology.   Preliminary frozen section histology of the nodule in question reveals  clear cell carcinoma consistent with metastatic renal cell cancer.  Cancer comes close to the stapled margin was some tumor noted within the  bronchus seen near the stapled margin.   The endoscopic GIA stapling devices were then used to perform a second  wedge resection to completely extend the staple margin 2 cm larger in  size.  In order to facilitate this, a small posterior thoracotomy  incision was extended in both directions.  The second wedge specimen was  removed and the final surgical margin was marked with a silk suture for  orientation purposes.  The specimen was sent to Pathology.  The right  chest was filled with warm saline solution and the right lung was  ventilated.  There was no sign of any significant air leak.  Meticulous  hemostasis was ascertained.   Frozen section histology of the second wedge specimen reveals the final  surgical margins free of tumor.   The right chest was drained with a 28-French chest tube placed through  the anterior port incision.  The On-Q continuous pain management system  was utilized to facilitate postoperative pain control.  Single 5-inch  catheter supplied with the On-Q kit was tunneled into the subpleural  space posteriorly.  The catheter was flushed with 5 mL of 0.5%  bupivacaine solution and ultimately connected to continuous infusion  pump.  The thoracotomy incision was closed in multiple layers in routine  fashion.  The skin incision was closed with subcuticular skin closure.  The anterior port incision was also closed in layers and its small skin  incision closed with  subcuticular closure.   The patient tolerated the procedure well and was extubated in the  operating room and transported to the recovery room in stable condition.  There were no intraoperative complications.  All sponge, instrument, and  needle counts verified correct at completion of the operation.  Estimated blood loss for the entire procedure was 200 mL.      Valentina Gu. Roxy Manns, M.D.  Electronically Signed     CHO/MEDQ  D:  09/26/2008  T:  09/27/2008  Job:  [id number]   cc:   [name], MD  Bernestine Amass, M.D.  [employer]is [employer]

## 2011-05-06 NOTE — Discharge Summary (Signed)
NAME:  Keith [name], Keith [name]               ACCOUNT NO.:  [id number]   MEDICAL RECORD NO.:  [id number]          PATIENT TYPE:  INP   LOCATION:  [id number]                         FACILITY:  [facility]   PHYSICIAN:  [name], MD      DATE OF BIRTH:  [birth year]   DATE OF ADMISSION:  08/07/2008  DATE OF DISCHARGE:  08/09/2008                               DISCHARGE SUMMARY   ADMISSION DIAGNOSIS:  Left renal mass.   DISCHARGE DIAGNOSIS:  Renal cell carcinoma.   HISTORY AND PHYSICAL:  For full details, please see admission history  and physical.  Briefly, [name] is a 45 year old gentleman who  recently presented with flank pain and hematuria.  He was found to have  a large enhancing left renal mass concerning for renal malignancy.  He  underwent a metastatic evaluation, which demonstrated a concerning right-  sided pulmonary nodule, which may represent metastatic disease.  After  discussion regarding management options for treatment, he elected to  proceed with a left laparoscopic radical nephrectomy.  Due to his gross  hematuria, he was also scheduled to undergo flexible cystoscopy at the  time to rule out any potential bladder pathology.   HOSPITAL COURSE:  On August 07, 2008, the patient was taken to the  operating room and underwent flexible cystoscopy, which revealed no  concerning findings.  He also underwent a left laparoscopic radical  nephrectomy.  He tolerated this procedure well and postoperatively was  able to be transferred to a regular hospital room following recovery  from anesthesia.  He was able to begin ambulating that evening and on  postoperative day one remained hemodynamically stable.  His hematocrit  was 35.0, which was not significantly changed from his postoperative  value.  He did have a slight rise in his creatinine up to 1.49 after  surgery although this did stabilize prior to discharge.  His diet was  gradually advanced following the return of bowel function, and he  was  able to tolerate a regular diet on the morning of postoperative day 2.  He was therefore felt to be stable for discharge home.   DISPOSITION:  Home.   DISCHARGE MEDICATIONS:  He was instructed to take Percocet as needed for  pain and to use Colace as a stool softener.   DISCHARGE INSTRUCTIONS:  He was instructed be ambulatory but  specifically told to refrain from any heavy lifting, strenuous activity,  or driving.  He was instructed on the signs and symptoms of wound  infection.   FOLLOWUP:  He will follow up in approximately 1 week for further  evaluation and to discuss his surgical pathology results in detail.      [name], MD  Electronically Signed    LB/MEDQ  D:  08/09/2008  T:  08/09/2008  Job:  [id number]

## 2011-06-27 ENCOUNTER — Ambulatory Visit
Admission: RE | Admit: 2011-06-27 | Discharge: 2011-06-27 | Disposition: A | Discharge status: Home / Self Care | Payer: PRIVATE HEALTH INSURANCE | Source: Ambulatory Visit | Attending: Oncology | Admitting: Oncology

## 2011-06-27 ENCOUNTER — Other Ambulatory Visit: Payer: Self-pay | Admitting: Oncology

## 2011-06-27 ENCOUNTER — Encounter (HOSPITAL_BASED_OUTPATIENT_CLINIC_OR_DEPARTMENT_OTHER): Payer: PRIVATE HEALTH INSURANCE | Admitting: Oncology

## 2011-06-27 DIAGNOSIS — C649 Malignant neoplasm of unspecified kidney, except renal pelvis: Secondary | ICD-10-CM

## 2011-06-27 DIAGNOSIS — C801 Malignant (primary) neoplasm, unspecified: Secondary | ICD-10-CM

## 2011-06-27 DIAGNOSIS — C7800 Secondary malignant neoplasm of unspecified lung: Secondary | ICD-10-CM

## 2011-06-27 LAB — COMPREHENSIVE METABOLIC PANEL
ALT: 41 U/L (ref 0–53)
AST: 26 U/L (ref 0–37)
Albumin: 4.3 g/dL (ref 3.5–5.2)
Alkaline Phosphatase: 93 U/L (ref 39–117)
BUN: 18 mg/dL (ref 6–23)
CO2: 25 mEq/L (ref 19–32)
Calcium: 8.7 mg/dL (ref 8.4–10.5)
Chloride: 106 mEq/L (ref 96–112)
Creatinine, Ser: 1.17 mg/dL (ref 0.50–1.35)
Glucose, Bld: 102 mg/dL — ABNORMAL HIGH (ref 70–99)
Potassium: 4.2 mEq/L (ref 3.5–5.3)
Sodium: 139 mEq/L (ref 135–145)
Total Bilirubin: 0.5 mg/dL (ref 0.3–1.2)
Total Protein: 7.1 g/dL (ref 6.0–8.3)

## 2011-06-27 LAB — CBC WITH DIFFERENTIAL/PLATELET
BASO%: 0.2 % (ref 0.0–2.0)
Basophils Absolute: 0 10*3/uL (ref 0.0–0.1)
EOS%: 3.8 % (ref 0.0–7.0)
Eosinophils Absolute: 0.2 10*3/uL (ref 0.0–0.5)
HCT: 39.6 % (ref 38.4–49.9)
HGB: 13.6 g/dL (ref 13.0–17.1)
LYMPH%: 29.5 % (ref 14.0–49.0)
MCH: 36.6 pg — ABNORMAL HIGH (ref 27.2–33.4)
MCHC: 34.4 g/dL (ref 32.0–36.0)
MCV: 106.4 fL — ABNORMAL HIGH (ref 79.3–98.0)
MONO#: 0.4 10*3/uL (ref 0.1–0.9)
MONO%: 9 % (ref 0.0–14.0)
NEUT#: 2.4 10*3/uL (ref 1.5–6.5)
NEUT%: 57.5 % (ref 39.0–75.0)
Platelets: 151 10*3/uL (ref 140–400)
RBC: 3.72 10*6/uL — ABNORMAL LOW (ref 4.20–5.82)
RDW: 15.5 % — ABNORMAL HIGH (ref 11.0–14.6)
WBC: 4.1 10*3/uL (ref 4.0–10.3)
lymph#: 1.2 10*3/uL (ref 0.9–3.3)

## 2011-06-27 MED ORDER — IOHEXOL 300 MG/ML  SOLN
75.0000 mL | Freq: Once | INTRAMUSCULAR | Status: AC | PRN
  Administered 2011-06-27: 75 mL via INTRAVENOUS

## 2011-07-04 ENCOUNTER — Other Ambulatory Visit: Payer: Self-pay | Admitting: Oncology

## 2011-07-04 ENCOUNTER — Encounter: Payer: Self-pay | Admitting: Gastroenterology

## 2011-07-04 ENCOUNTER — Encounter (HOSPITAL_BASED_OUTPATIENT_CLINIC_OR_DEPARTMENT_OTHER): Payer: PRIVATE HEALTH INSURANCE | Admitting: Oncology

## 2011-07-04 DIAGNOSIS — C649 Malignant neoplasm of unspecified kidney, except renal pelvis: Secondary | ICD-10-CM

## 2011-07-04 DIAGNOSIS — Z85118 Personal history of other malignant neoplasm of bronchus and lung: Secondary | ICD-10-CM

## 2011-07-04 DIAGNOSIS — C801 Malignant (primary) neoplasm, unspecified: Secondary | ICD-10-CM

## 2011-07-04 DIAGNOSIS — Z85528 Personal history of other malignant neoplasm of kidney: Secondary | ICD-10-CM

## 2011-07-04 DIAGNOSIS — C3490 Malignant neoplasm of unspecified part of unspecified bronchus or lung: Secondary | ICD-10-CM

## 2011-07-04 DIAGNOSIS — C7800 Secondary malignant neoplasm of unspecified lung: Secondary | ICD-10-CM

## 2011-08-01 ENCOUNTER — Ambulatory Visit
Admission: RE | Admit: 2011-08-01 | Discharge: 2011-08-01 | Disposition: A | Discharge status: Home / Self Care | Payer: PRIVATE HEALTH INSURANCE | Source: Ambulatory Visit | Attending: Oncology | Admitting: Oncology

## 2011-08-01 DIAGNOSIS — Z85118 Personal history of other malignant neoplasm of bronchus and lung: Secondary | ICD-10-CM

## 2011-08-01 DIAGNOSIS — Z85528 Personal history of other malignant neoplasm of kidney: Secondary | ICD-10-CM

## 2011-08-01 MED ORDER — IOHEXOL 300 MG/ML  SOLN
100.0000 mL | Freq: Once | INTRAMUSCULAR | Status: AC | PRN
  Administered 2011-08-01: 100 mL via INTRAVENOUS

## 2011-08-06 ENCOUNTER — Encounter: Payer: Self-pay | Admitting: *Deleted

## 2011-08-08 ENCOUNTER — Other Ambulatory Visit: Payer: Self-pay | Admitting: Oncology

## 2011-08-08 ENCOUNTER — Encounter (HOSPITAL_BASED_OUTPATIENT_CLINIC_OR_DEPARTMENT_OTHER): Payer: PRIVATE HEALTH INSURANCE | Admitting: Oncology

## 2011-08-08 DIAGNOSIS — C801 Malignant (primary) neoplasm, unspecified: Secondary | ICD-10-CM

## 2011-08-08 DIAGNOSIS — C7800 Secondary malignant neoplasm of unspecified lung: Secondary | ICD-10-CM

## 2011-08-08 DIAGNOSIS — C649 Malignant neoplasm of unspecified kidney, except renal pelvis: Secondary | ICD-10-CM

## 2011-08-08 LAB — COMPREHENSIVE METABOLIC PANEL
ALT: 52 U/L (ref 0–53)
AST: 31 U/L (ref 0–37)
Albumin: 4.5 g/dL (ref 3.5–5.2)
Alkaline Phosphatase: 88 U/L (ref 39–117)
BUN: 13 mg/dL (ref 6–23)
CO2: 27 mEq/L (ref 19–32)
Calcium: 9.4 mg/dL (ref 8.4–10.5)
Chloride: 103 mEq/L (ref 96–112)
Creatinine, Ser: 1.25 mg/dL (ref 0.50–1.35)
Glucose, Bld: 106 mg/dL — ABNORMAL HIGH (ref 70–99)
Potassium: 4.2 mEq/L (ref 3.5–5.3)
Sodium: 139 mEq/L (ref 135–145)
Total Bilirubin: 0.6 mg/dL (ref 0.3–1.2)
Total Protein: 7.2 g/dL (ref 6.0–8.3)

## 2011-08-08 LAB — CBC WITH DIFFERENTIAL/PLATELET
BASO%: 0.3 % (ref 0.0–2.0)
Basophils Absolute: 0 10*3/uL (ref 0.0–0.1)
EOS%: 3.6 % (ref 0.0–7.0)
Eosinophils Absolute: 0.2 10*3/uL (ref 0.0–0.5)
HCT: 42.5 % (ref 38.4–49.9)
HGB: 14.9 g/dL (ref 13.0–17.1)
LYMPH%: 30.1 % (ref 14.0–49.0)
MCH: 37.5 pg — ABNORMAL HIGH (ref 27.2–33.4)
MCHC: 35.1 g/dL (ref 32.0–36.0)
MCV: 106.8 fL — ABNORMAL HIGH (ref 79.3–98.0)
MONO#: 0.3 10*3/uL (ref 0.1–0.9)
MONO%: 7.7 % (ref 0.0–14.0)
NEUT#: 2.4 10*3/uL (ref 1.5–6.5)
NEUT%: 58.3 % (ref 39.0–75.0)
Platelets: 137 10*3/uL — ABNORMAL LOW (ref 140–400)
RBC: 3.98 10*6/uL — ABNORMAL LOW (ref 4.20–5.82)
RDW: 15.8 % — ABNORMAL HIGH (ref 11.0–14.6)
WBC: 4.2 10*3/uL (ref 4.0–10.3)
lymph#: 1.3 10*3/uL (ref 0.9–3.3)

## 2011-08-12 ENCOUNTER — Ambulatory Visit (INDEPENDENT_AMBULATORY_CARE_PROVIDER_SITE_OTHER): Payer: PRIVATE HEALTH INSURANCE | Admitting: Gastroenterology

## 2011-08-12 ENCOUNTER — Encounter: Payer: Self-pay | Admitting: Gastroenterology

## 2011-08-12 VITALS — BP 142/80 | HR 68 | Ht 74.0 in | Wt 227.0 lb

## 2011-08-12 DIAGNOSIS — R933 Abnormal findings on diagnostic imaging of other parts of digestive tract: Secondary | ICD-10-CM

## 2011-08-12 DIAGNOSIS — K869 Disease of pancreas, unspecified: Secondary | ICD-10-CM

## 2011-08-12 DIAGNOSIS — C649 Malignant neoplasm of unspecified kidney, except renal pelvis: Secondary | ICD-10-CM

## 2011-08-12 NOTE — Patient Instructions (Addendum)
One of your biggest health concerns is your smoking.  This increases your risk for most cancers and serious cardiovascular diseases such as strokes, heart attacks.  You should try your best to stop.  If you need assistence, please contact your PCP or Smoking Cessation Class at [facility] [phone]) or [place] (1-800-QUIT-NOW). We will get Dr. Lajoyce Corners colonoscopy report, pathology from 2009. You will be set up for an upper EUS examination for the pancreatic lesion (75min, radial, +/- linear, ++propofol). Same time flex sigmoidoscopy for minor rectal bleeding. A copy of this information will be made available to Dr. Osker Mason, Dr. Shelia Media.

## 2011-08-12 NOTE — Progress Notes (Signed)
HPI: This is a  very pleasant 45 year old man who is here with his wife today.  He was diagnosed with renal cell cancer 2009, he underwent a nephrectomy. He was stage IIIB. 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  Never had pancreatitis disease in past.  Used to drink a  Lot of alcohol, none in 5-6 months because he was feeling sick.  Heavy weekend bing drinker.  Started sutent (oral chemo) for about  Year.  Radiation was tried in 2011 (aiming at lung mass).  Left nephrectomy 2009.  He has blood in stool about once a month.  Usually with diarrhea.  He had colonoscopy in past 2-3 years.   Having abd pains, radiating to his back lately.    Weight has been stable.  He had a recent CT scan, March 2012 that suggests a small body of pancreas lesion that was vascular appearing. Followup imaging last month showed this lesion may have grown slightly. I reviewed the images myself as well as the report. It appears very amenable to endoscopic ultrasound fine-needle aspiration.   Review of systems: Pertinent positive and negative review of systems were noted in the above HPI section.  All other review of systems was otherwise negative.   Past Medical History  Diagnosis Date  . Renal cell cancer     renal cell ca dx 06/2008  . Pancreatic mass     Past Surgical History  Procedure Date  . Nephrectomy radical     Left   . Lung removal, partial 09/2008     reports that he has been smoking.  He has never used smokeless tobacco. He reports that he does not drink alcohol or use illicit drugs.  family history includes Diabetes in his brother and Irritable bowel syndrome in his sister.  There is no history of Colon cancer.    Current Medications, Allergies were all reviewed with the patient via [facility] record system.    Physical Exam: BP 142/80  Pulse 68  Ht 6\' 2"  (1.88 m)  Wt 227 lb (102.967 kg)  BMI 29.15  kg/m2 Constitutional: generally well-appearing Psychiatric: alert and oriented x3 Eyes: extraocular movements intact Mouth: oral pharynx moist, no lesions Neck: supple no lymphadenopathy Cardiovascular: heart regular rate and rhythm Lungs: clear to auscultation bilaterally Abdomen: soft, nontender, nondistended, no obvious ascites, no peritoneal signs, normal bowel sounds Extremities: no lower extremity edema bilaterally Skin: no lesions on visible extremities    Assessment and plan: 45 y.o. male with stage IV renal cell carcinoma, new pancreatic lesion, intermittent bright blood per rectum  We'll proceed with endoscopic ultrasound to evaluate and aspirate the pancreatic lesion. It might be an islet cell tumor, perhaps metastatic renal cancer. There is hype vascular appearing and so I doubt it is primary pancreatic adenocarcinoma. The same time as his endoscopic ultrasound performed flexible sigmoidoscopy to evaluate his intermittent rectal bleeding. We will get records sent from his previous gastroenterologist as well. He was decreased alcohol I think propofol sedation would be bestr

## 2011-08-26 ENCOUNTER — Telehealth: Payer: Self-pay | Admitting: Gastroenterology

## 2011-08-26 NOTE — Telephone Encounter (Signed)
Forwarded to Dr. Ardis Hughs for review.

## 2011-08-28 ENCOUNTER — Telehealth: Payer: Self-pay | Admitting: Gastroenterology

## 2011-08-28 NOTE — Telephone Encounter (Signed)
Reviewed colonoscopy Dr. Lajoyce Corners, 11/2008, done for "hematochezia;" findings "medium sized internal hemorrhoids."  No polyps or cancers.

## 2011-09-12 ENCOUNTER — Other Ambulatory Visit: Payer: Self-pay | Admitting: Oncology

## 2011-09-12 ENCOUNTER — Encounter (HOSPITAL_BASED_OUTPATIENT_CLINIC_OR_DEPARTMENT_OTHER): Payer: PRIVATE HEALTH INSURANCE | Admitting: Oncology

## 2011-09-12 DIAGNOSIS — C801 Malignant (primary) neoplasm, unspecified: Secondary | ICD-10-CM

## 2011-09-12 DIAGNOSIS — C7800 Secondary malignant neoplasm of unspecified lung: Secondary | ICD-10-CM

## 2011-09-12 DIAGNOSIS — K869 Disease of pancreas, unspecified: Secondary | ICD-10-CM

## 2011-09-12 DIAGNOSIS — R197 Diarrhea, unspecified: Secondary | ICD-10-CM

## 2011-09-12 DIAGNOSIS — C649 Malignant neoplasm of unspecified kidney, except renal pelvis: Secondary | ICD-10-CM

## 2011-09-12 LAB — CBC WITH DIFFERENTIAL/PLATELET
BASO%: 0.4 % (ref 0.0–2.0)
Basophils Absolute: 0 10*3/uL (ref 0.0–0.1)
EOS%: 3.4 % (ref 0.0–7.0)
Eosinophils Absolute: 0.1 10*3/uL (ref 0.0–0.5)
HCT: 44.1 % (ref 38.4–49.9)
HGB: 15.3 g/dL (ref 13.0–17.1)
LYMPH%: 24.8 % (ref 14.0–49.0)
MCH: 37 pg — ABNORMAL HIGH (ref 27.2–33.4)
MCHC: 34.6 g/dL (ref 32.0–36.0)
MCV: 106.8 fL — ABNORMAL HIGH (ref 79.3–98.0)
MONO#: 0.2 10*3/uL (ref 0.1–0.9)
MONO%: 4.9 % (ref 0.0–14.0)
NEUT#: 2.9 10*3/uL (ref 1.5–6.5)
NEUT%: 66.5 % (ref 39.0–75.0)
Platelets: 101 10*3/uL — ABNORMAL LOW (ref 140–400)
RBC: 4.12 10*6/uL — ABNORMAL LOW (ref 4.20–5.82)
RDW: 15.2 % — ABNORMAL HIGH (ref 11.0–14.6)
WBC: 4.3 10*3/uL (ref 4.0–10.3)
lymph#: 1.1 10*3/uL (ref 0.9–3.3)

## 2011-09-12 LAB — COMPREHENSIVE METABOLIC PANEL
ALT: 48 U/L (ref 0–53)
AST: 32 U/L (ref 0–37)
Albumin: 4.3 g/dL (ref 3.5–5.2)
Alkaline Phosphatase: 87 U/L (ref 39–117)
BUN: 11 mg/dL (ref 6–23)
CO2: 25 mEq/L (ref 19–32)
Calcium: 8.9 mg/dL (ref 8.4–10.5)
Chloride: 103 mEq/L (ref 96–112)
Creatinine, Ser: 1.2 mg/dL (ref 0.50–1.35)
Glucose, Bld: 135 mg/dL — ABNORMAL HIGH (ref 70–99)
Potassium: 4 mEq/L (ref 3.5–5.3)
Sodium: 139 mEq/L (ref 135–145)
Total Bilirubin: 0.6 mg/dL (ref 0.3–1.2)
Total Protein: 7.3 g/dL (ref 6.0–8.3)

## 2011-09-16 ENCOUNTER — Ambulatory Visit (HOSPITAL_COMMUNITY)
Admission: RE | Admit: 2011-09-16 | Discharge: 2011-09-16 | Disposition: A | Discharge status: Home / Self Care | Payer: PRIVATE HEALTH INSURANCE | Source: Ambulatory Visit | Attending: Gastroenterology | Admitting: Gastroenterology

## 2011-09-18 ENCOUNTER — Ambulatory Visit (HOSPITAL_COMMUNITY)
Admission: RE | Admit: 2011-09-18 | Discharge: 2011-09-18 | Disposition: A | Discharge status: Home / Self Care | Payer: PRIVATE HEALTH INSURANCE | Source: Ambulatory Visit | Attending: Gastroenterology | Admitting: Gastroenterology

## 2011-09-18 ENCOUNTER — Encounter: Payer: PRIVATE HEALTH INSURANCE | Admitting: Gastroenterology

## 2011-09-18 ENCOUNTER — Other Ambulatory Visit: Payer: Self-pay | Admitting: Gastroenterology

## 2011-09-18 DIAGNOSIS — R933 Abnormal findings on diagnostic imaging of other parts of digestive tract: Secondary | ICD-10-CM

## 2011-09-18 DIAGNOSIS — C649 Malignant neoplasm of unspecified kidney, except renal pelvis: Secondary | ICD-10-CM | POA: Insufficient documentation

## 2011-09-18 DIAGNOSIS — K625 Hemorrhage of anus and rectum: Secondary | ICD-10-CM | POA: Insufficient documentation

## 2011-09-18 DIAGNOSIS — K649 Unspecified hemorrhoids: Secondary | ICD-10-CM | POA: Insufficient documentation

## 2011-09-18 DIAGNOSIS — C7889 Secondary malignant neoplasm of other digestive organs: Secondary | ICD-10-CM | POA: Insufficient documentation

## 2011-09-18 LAB — URINALYSIS, ROUTINE W REFLEX MICROSCOPIC
Bilirubin Urine: NEGATIVE
Glucose, UA: NEGATIVE
Ketones, ur: NEGATIVE
Nitrite: NEGATIVE
Protein, ur: NEGATIVE
Specific Gravity, Urine: 1.026
Urobilinogen, UA: 0.2
pH: 5

## 2011-09-18 LAB — URINE MICROSCOPIC-ADD ON

## 2011-09-19 LAB — BASIC METABOLIC PANEL
BUN: 8
CO2: 30
Calcium: 10.1
Chloride: 104
Creatinine, Ser: 0.95
GFR calc Af Amer: >60
GFR calc non Af Amer: >60
Glucose, Bld: 97
Potassium: 5
Sodium: 142

## 2011-09-19 LAB — CBC
HCT: 45.4
Hemoglobin: 15.3
MCHC: 33.7
MCV: 94.2
Platelets: 241
RBC: 4.83
RDW: 13.1
WBC: 10.6 — ABNORMAL HIGH

## 2011-09-22 ENCOUNTER — Telehealth: Payer: Self-pay | Admitting: Gastroenterology

## 2011-09-23 LAB — BLOOD GAS, ARTERIAL
Acid-Base Excess: 0.9
Acid-base deficit: 1.9
Bicarbonate: 22.2
Bicarbonate: 25.3 — ABNORMAL HIGH
Drawn by: 206361
FIO2: 0.21
O2 Content: 2
O2 Saturation: 87.2
O2 Saturation: 95.9
Patient temperature: 98.6
Patient temperature: 98.9
TCO2: 23.3
TCO2: 26.6
pCO2 arterial: 36.6
pCO2 arterial: 42.5
pH, Arterial: 7.393
pH, Arterial: 7.4
pO2, Arterial: 53.1 — ABNORMAL LOW
pO2, Arterial: 79.1 — ABNORMAL LOW

## 2011-09-23 LAB — CBC
HCT: 32.4 — ABNORMAL LOW
HCT: 33.2 — ABNORMAL LOW
HCT: 40
Hemoglobin: 11.4 — ABNORMAL LOW
Hemoglobin: 11.6 — ABNORMAL LOW
Hemoglobin: 13.7
MCHC: 34.2
MCHC: 35.1
MCHC: 35.3
MCV: 91
MCV: 92
MCV: 93.1
Platelets: 212
Platelets: 223
Platelets: 260
RBC: 3.56 — ABNORMAL LOW
RBC: 3.61 — ABNORMAL LOW
RBC: 4.3
RDW: 13
RDW: 13.6
RDW: 13.7
WBC: 11.2 — ABNORMAL HIGH
WBC: 12.8 — ABNORMAL HIGH
WBC: 8.6

## 2011-09-23 LAB — URINALYSIS, ROUTINE W REFLEX MICROSCOPIC
Bilirubin Urine: NEGATIVE
Glucose, UA: NEGATIVE
Hgb urine dipstick: NEGATIVE
Ketones, ur: NEGATIVE
Nitrite: NEGATIVE
Protein, ur: NEGATIVE
Specific Gravity, Urine: 1.009
Urobilinogen, UA: 0.2
pH: 5.5

## 2011-09-23 LAB — BASIC METABOLIC PANEL
BUN: 14
BUN: 20
CO2: 22
CO2: 26
Calcium: 8.4
Calcium: 9.7
Chloride: 101
Chloride: 106
Creatinine, Ser: 1.26
Creatinine, Ser: 1.38
GFR calc Af Amer: >60
GFR calc Af Amer: >60
GFR calc non Af Amer: 57 — ABNORMAL LOW
GFR calc non Af Amer: >60
Glucose, Bld: 109 — ABNORMAL HIGH
Glucose, Bld: 97
Potassium: 4
Potassium: 4.5
Sodium: 134 — ABNORMAL LOW
Sodium: 138

## 2011-09-23 LAB — TYPE AND SCREEN
ABO/RH(D): A NEG
Antibody Screen: NEGATIVE

## 2011-09-23 LAB — COMPREHENSIVE METABOLIC PANEL
ALT: 33
AST: 27
Albumin: 3.2 — ABNORMAL LOW
Alkaline Phosphatase: 68
BUN: 12
CO2: 27
Calcium: 8.8
Chloride: 101
Creatinine, Ser: 1.42
GFR calc Af Amer: >60
GFR calc non Af Amer: 55 — ABNORMAL LOW
Glucose, Bld: 106 — ABNORMAL HIGH
Potassium: 4.3
Sodium: 135
Total Bilirubin: 0.8
Total Protein: 6.8

## 2011-09-23 LAB — PROTIME-INR
INR: 0.9
Prothrombin Time: 12.2

## 2011-09-23 LAB — APTT: aPTT: 25

## 2011-09-23 LAB — ABO/RH: ABO/RH(D): A NEG

## 2011-09-23 NOTE — Telephone Encounter (Signed)
Dr Fuller Plan alternate numbers are cell [phone]   [phone] home [phone] work

## 2011-09-23 NOTE — Telephone Encounter (Signed)
Left message on home phone for patient to call back at [phone]. No return call as of Tuesday at 4p. Dr. 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 Ardis Hughs is away this week.  I have asked Christian Mate to try to other phone numbers to reach patient.

## 2011-09-24 NOTE — Telephone Encounter (Signed)
I contacted the patient on his cell phone and reviewed the diagnosis of metastatic renal cell cancer to the pancreas. We will notify his oncologist and patient agrees.

## 2011-09-24 NOTE — Telephone Encounter (Signed)
Path routed to oncologist [name]

## 2011-09-26 LAB — CREATININE, SERUM
Creatinine, Ser: 1.27 mg/dL (ref 0.4–1.5)
GFR calc Af Amer: >60 mL/min (ref >60)
GFR calc non Af Amer: >60 mL/min (ref >60)

## 2011-09-26 LAB — BUN: BUN: 11 mg/dL (ref 6–23)

## 2011-10-09 ENCOUNTER — Emergency Department (HOSPITAL_COMMUNITY): Payer: PRIVATE HEALTH INSURANCE

## 2011-10-09 ENCOUNTER — Emergency Department (HOSPITAL_COMMUNITY)
Admission: EM | Admit: 2011-10-09 | Discharge: 2011-10-09 | Disposition: A | Discharge status: Home / Self Care | Payer: PRIVATE HEALTH INSURANCE | Attending: Emergency Medicine | Admitting: Emergency Medicine

## 2011-10-09 DIAGNOSIS — C7889 Secondary malignant neoplasm of other digestive organs: Secondary | ICD-10-CM | POA: Insufficient documentation

## 2011-10-09 DIAGNOSIS — Z85528 Personal history of other malignant neoplasm of kidney: Secondary | ICD-10-CM | POA: Insufficient documentation

## 2011-10-09 DIAGNOSIS — R42 Dizziness and giddiness: Secondary | ICD-10-CM | POA: Insufficient documentation

## 2011-10-09 DIAGNOSIS — Z85118 Personal history of other malignant neoplasm of bronchus and lung: Secondary | ICD-10-CM | POA: Insufficient documentation

## 2011-10-09 DIAGNOSIS — K648 Other hemorrhoids: Secondary | ICD-10-CM | POA: Insufficient documentation

## 2011-10-09 DIAGNOSIS — K625 Hemorrhage of anus and rectum: Secondary | ICD-10-CM | POA: Insufficient documentation

## 2011-10-09 LAB — CBC
HCT: 42.5 % (ref 39.0–52.0)
Hemoglobin: 14.5 g/dL (ref 13.0–17.0)
MCH: 36.1 pg — ABNORMAL HIGH (ref 26.0–34.0)
MCHC: 34.1 g/dL (ref 30.0–36.0)
MCV: 105.7 fL — ABNORMAL HIGH (ref 78.0–100.0)
Platelets: 173 10*3/uL (ref 150–400)
RBC: 4.02 MIL/uL — ABNORMAL LOW (ref 4.22–5.81)
RDW: 14.1 % (ref 11.5–15.5)
WBC: 6.1 10*3/uL (ref 4.0–10.5)

## 2011-10-09 LAB — URINALYSIS, ROUTINE W REFLEX MICROSCOPIC
Bilirubin Urine: NEGATIVE
Glucose, UA: NEGATIVE mg/dL
Hgb urine dipstick: NEGATIVE
Ketones, ur: NEGATIVE mg/dL
Leukocytes, UA: NEGATIVE
Nitrite: NEGATIVE
Protein, ur: NEGATIVE mg/dL
Specific Gravity, Urine: 1.011 (ref 1.005–1.030)
Urobilinogen, UA: 0.2 mg/dL (ref 0.0–1.0)
pH: 6 (ref 5.0–8.0)

## 2011-10-09 LAB — DIFFERENTIAL
Basophils Absolute: 0 10*3/uL (ref 0.0–0.1)
Basophils Relative: 0 % (ref 0–1)
Eosinophils Absolute: 0.1 10*3/uL (ref 0.0–0.7)
Eosinophils Relative: 1 % (ref 0–5)
Lymphocytes Relative: 38 % (ref 12–46)
Lymphs Abs: 2.3 10*3/uL (ref 0.7–4.0)
Monocytes Absolute: 0.2 10*3/uL (ref 0.1–1.0)
Monocytes Relative: 4 % (ref 3–12)
Neutro Abs: 3.5 10*3/uL (ref 1.7–7.7)
Neutrophils Relative %: 57 % (ref 43–77)

## 2011-10-09 LAB — COMPREHENSIVE METABOLIC PANEL
ALT: 46 U/L (ref 0–53)
AST: 32 U/L (ref 0–37)
Albumin: 4.1 g/dL (ref 3.5–5.2)
Alkaline Phosphatase: 94 U/L (ref 39–117)
BUN: 11 mg/dL (ref 6–23)
CO2: 25 mEq/L (ref 19–32)
Calcium: 9.3 mg/dL (ref 8.4–10.5)
Chloride: 104 mEq/L (ref 96–112)
Creatinine, Ser: 1.3 mg/dL (ref 0.50–1.35)
GFR calc Af Amer: 76 mL/min — ABNORMAL LOW (ref >90)
GFR calc non Af Amer: 65 mL/min — ABNORMAL LOW (ref >90)
Glucose, Bld: 84 mg/dL (ref 70–99)
Potassium: 3.8 mEq/L (ref 3.5–5.1)
Sodium: 138 mEq/L (ref 135–145)
Total Bilirubin: 0.3 mg/dL (ref 0.3–1.2)
Total Protein: 7.7 g/dL (ref 6.0–8.3)

## 2011-10-09 LAB — CK TOTAL AND CKMB (NOT AT ARMC)
CK, MB: 5.1 ng/mL — ABNORMAL HIGH (ref 0.3–4.0)
Relative Index: 1.4 (ref 0.0–2.5)
Total CK: 373 U/L — ABNORMAL HIGH (ref 7–232)

## 2011-10-09 LAB — PROTIME-INR
INR: 0.93 (ref 0.00–1.49)
Prothrombin Time: 12.7 seconds (ref 11.6–15.2)

## 2011-10-09 LAB — TROPONIN I: Troponin I: <0.3 ng/mL (ref <0.30)

## 2011-10-09 LAB — LACTIC ACID, PLASMA: Lactic Acid, Venous: <0.2 mmol/L — ABNORMAL LOW (ref 0.5–2.2)

## 2011-10-09 MED ORDER — IOHEXOL 300 MG/ML  SOLN
100.0000 mL | Freq: Once | INTRAMUSCULAR | Status: AC | PRN
  Administered 2011-10-09: 100 mL via INTRAVENOUS

## 2011-10-10 LAB — OCCULT BLOOD, POC DEVICE
Fecal Occult Bld: POSITIVE
Fecal Occult Bld: POSITIVE

## 2011-10-13 ENCOUNTER — Telehealth: Payer: Self-pay | Admitting: Gastroenterology

## 2011-10-13 NOTE — Telephone Encounter (Signed)
Pt was given a follow up appt with Dr Ardis Hughs for 10/29/11

## 2011-10-17 ENCOUNTER — Other Ambulatory Visit: Payer: Self-pay | Admitting: Oncology

## 2011-10-17 ENCOUNTER — Other Ambulatory Visit: Payer: Self-pay | Admitting: Medical

## 2011-10-17 ENCOUNTER — Encounter (HOSPITAL_BASED_OUTPATIENT_CLINIC_OR_DEPARTMENT_OTHER): Payer: PRIVATE HEALTH INSURANCE | Admitting: Oncology

## 2011-10-17 DIAGNOSIS — C7889 Secondary malignant neoplasm of other digestive organs: Secondary | ICD-10-CM

## 2011-10-17 DIAGNOSIS — C649 Malignant neoplasm of unspecified kidney, except renal pelvis: Secondary | ICD-10-CM

## 2011-10-17 DIAGNOSIS — C7800 Secondary malignant neoplasm of unspecified lung: Secondary | ICD-10-CM

## 2011-10-17 DIAGNOSIS — D6959 Other secondary thrombocytopenia: Secondary | ICD-10-CM

## 2011-10-17 DIAGNOSIS — C801 Malignant (primary) neoplasm, unspecified: Secondary | ICD-10-CM

## 2011-10-17 LAB — COMPREHENSIVE METABOLIC PANEL
ALT: 40 U/L (ref 0–53)
AST: 30 U/L (ref 0–37)
Albumin: 4.3 g/dL (ref 3.5–5.2)
Alkaline Phosphatase: 78 U/L (ref 39–117)
BUN: 13 mg/dL (ref 6–23)
CO2: 29 mEq/L (ref 19–32)
Calcium: 8.9 mg/dL (ref 8.4–10.5)
Chloride: 105 mEq/L (ref 96–112)
Creatinine, Ser: 1.26 mg/dL (ref 0.50–1.35)
Glucose, Bld: 105 mg/dL — ABNORMAL HIGH (ref 70–99)
Potassium: 4.2 mEq/L (ref 3.5–5.3)
Sodium: 140 mEq/L (ref 135–145)
Total Bilirubin: 0.5 mg/dL (ref 0.3–1.2)
Total Protein: 7.1 g/dL (ref 6.0–8.3)

## 2011-10-17 LAB — CBC WITH DIFFERENTIAL/PLATELET
BASO%: 0.3 % (ref 0.0–2.0)
Basophils Absolute: 0 10*3/uL (ref 0.0–0.1)
EOS%: 1.7 % (ref 0.0–7.0)
Eosinophils Absolute: 0.1 10*3/uL (ref 0.0–0.5)
HCT: 42.3 % (ref 38.4–49.9)
HGB: 14.6 g/dL (ref 13.0–17.1)
LYMPH%: 23.4 % (ref 14.0–49.0)
MCH: 37 pg — ABNORMAL HIGH (ref 27.2–33.4)
MCHC: 34.4 g/dL (ref 32.0–36.0)
MCV: 107.3 fL — ABNORMAL HIGH (ref 79.3–98.0)
MONO#: 0.3 10*3/uL (ref 0.1–0.9)
MONO%: 4.6 % (ref 0.0–14.0)
NEUT#: 4.1 10*3/uL (ref 1.5–6.5)
NEUT%: 70 % (ref 39.0–75.0)
Platelets: 107 10*3/uL — ABNORMAL LOW (ref 140–400)
RBC: 3.95 10*6/uL — ABNORMAL LOW (ref 4.20–5.82)
RDW: 15.2 % — ABNORMAL HIGH (ref 11.0–14.6)
WBC: 5.8 10*3/uL (ref 4.0–10.3)
lymph#: 1.4 10*3/uL (ref 0.9–3.3)

## 2011-10-27 ENCOUNTER — Telehealth: Payer: Self-pay | Admitting: *Deleted

## 2011-10-27 NOTE — Telephone Encounter (Signed)
RECEIVED A FAX FROM [facility] CONCERNING A PRESCRIPTION REFILL REQUEST FOR SUTENT. THIS REQUEST WAS GIVEN TO DR.SHADAD'S NURSE, DIXIE SMITH,RN.

## 2011-10-28 ENCOUNTER — Encounter: Payer: Self-pay | Admitting: *Deleted

## 2011-10-28 ENCOUNTER — Telehealth: Payer: Self-pay | Admitting: *Deleted

## 2011-10-28 NOTE — Telephone Encounter (Signed)
RECEIVED A FAX FROM [facility] CONCERNING A CONFIRMATION OF FACSIMILE RECEIPT. THIS NOTICE WAS GIVEN TO DR.SHADAD'S NURSE, DIXIE Protection WILL NOTIFY [facility] ON Friday CONCERNING THE DATE TO SHIP MEDICATION TO PT.

## 2011-10-28 NOTE — Telephone Encounter (Signed)
CALLED [facility], TONI, TO NOTIFY THAT PT.'S SUTENT MEDICATION HAS BEEN PUT ON HOLD. DR.SHADAD'S NURSE WILL CONTACT PT. ON Friday FOR AN UPDATE ON HIS CONDITION.

## 2011-10-28 NOTE — Progress Notes (Unsigned)
Patient's wife calling to report that Hutchinson's feet and lower legs are very weak and reddish-purple. Feet are warm to touch. Per [name], patient should discontinue sutent x 1 week. Call desk nurse on Friday and give an update on his progress.

## 2011-10-29 ENCOUNTER — Other Ambulatory Visit: Payer: Self-pay | Admitting: *Deleted

## 2011-10-29 ENCOUNTER — Telehealth: Payer: Self-pay | Admitting: *Deleted

## 2011-10-29 ENCOUNTER — Ambulatory Visit: Payer: PRIVATE HEALTH INSURANCE | Admitting: Gastroenterology

## 2011-10-29 NOTE — Telephone Encounter (Signed)
Patient called to say his feet and ankles are hurting badly, and he needs something for pain. Per [name], script left at front for oxycodone. Patient notified.

## 2011-11-03 ENCOUNTER — Encounter: Payer: Self-pay | Admitting: *Deleted

## 2011-11-03 ENCOUNTER — Telehealth: Payer: Self-pay | Admitting: *Deleted

## 2011-11-03 NOTE — Telephone Encounter (Signed)
NO NOTE

## 2011-11-03 NOTE — Progress Notes (Signed)
RECEIVED A FAX FROM [facility] CONCERNING A CONFIRMATION OF PRESCRIPTION SHIPMENT.

## 2011-11-08 IMAGING — CT CT CHEST W/ CM
2 of 5 series · 16 of 46 positions shown, 18 images · IV contrast (agent unspecified)
Comparison: none

CLINICAL DATA: History of left-sided renal carcinoma and right-
sided lung cancer.  Status post left nephrectomy and right lung
resection.  Restarted smoking 1 month ago.

CT CHEST, ABDOMEN AND PELVIS WITH CONTRAST
TECHNIQUE: Contiguous axial images of the chest abdomen and pelvis
were obtained after IV contrast administration.
Contrast: 100  ml [id number]
CT CHEST

[Series 2: cap with st · axial · 0.78mm/px · z∈[-710,-80]mm · 13 of 142 slices shown, 15 images]
[im 8/142  soft-tissue]
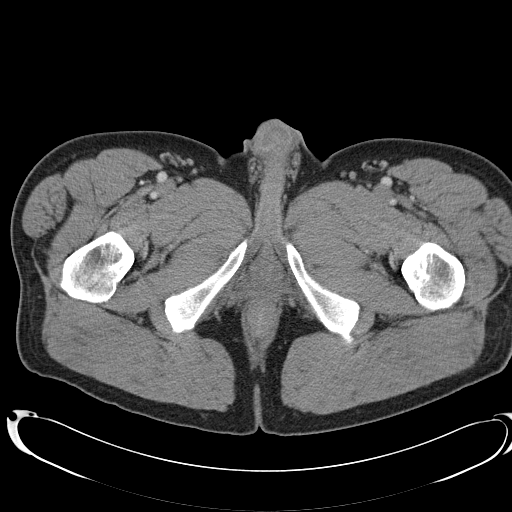
[im 8/142  bone]
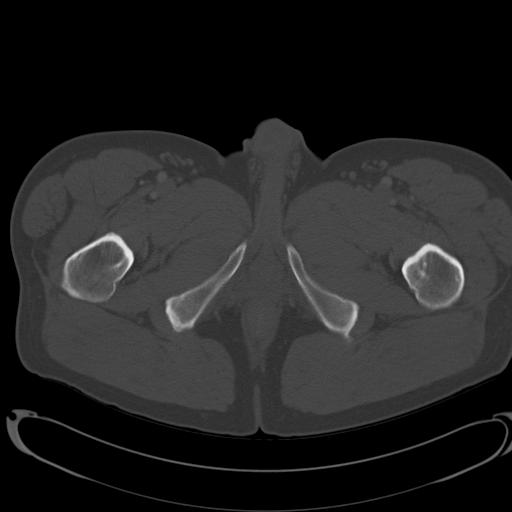
[im 16/142  soft-tissue]
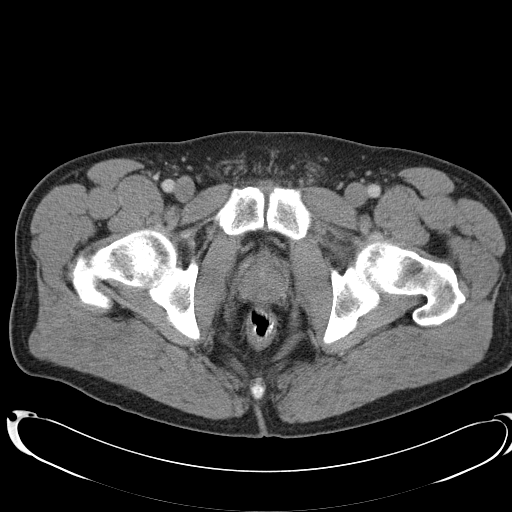
[im 32/142  soft-tissue]
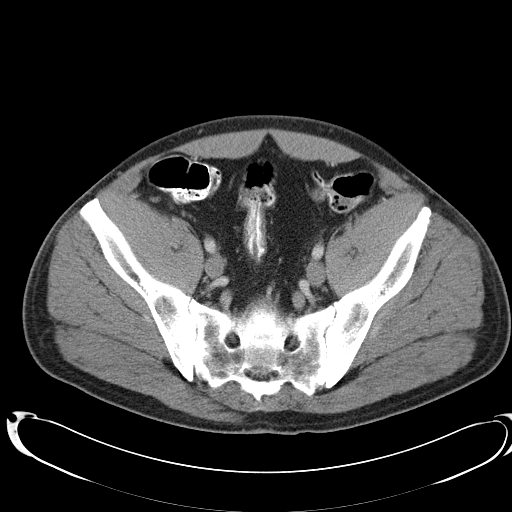
[im 40/142  soft-tissue]
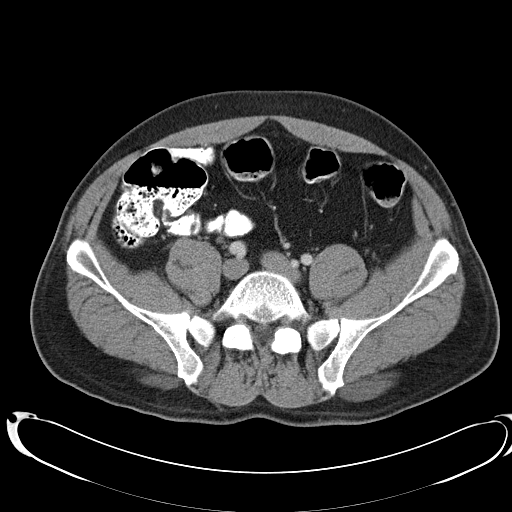
[im 48/142  soft-tissue]
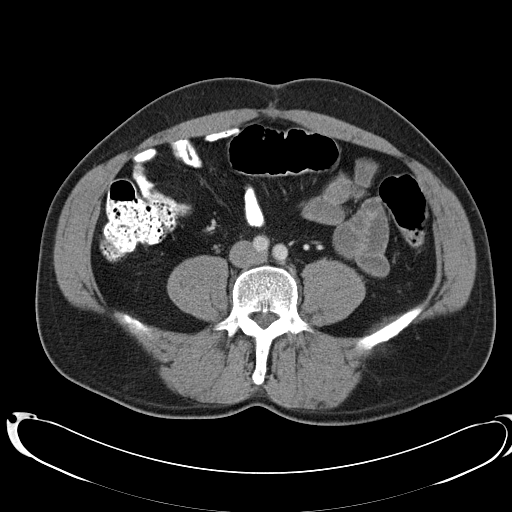
[im 63/142  soft-tissue]
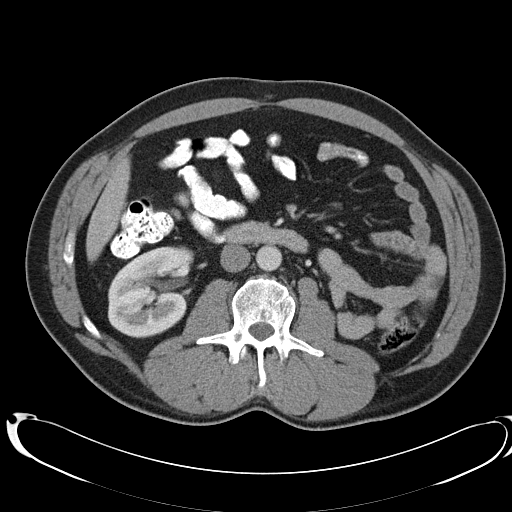
[im 71/142  soft-tissue]
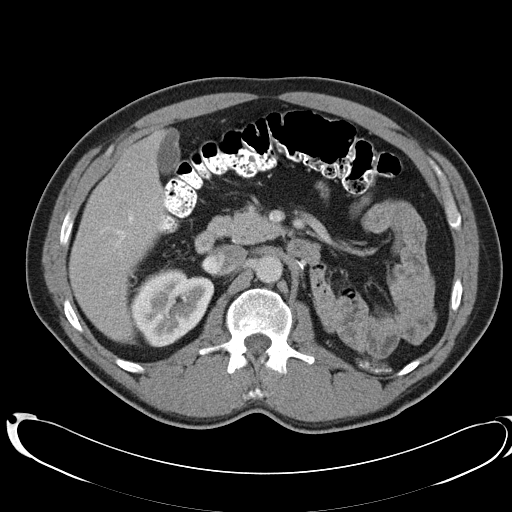
[im 79/142  soft-tissue]
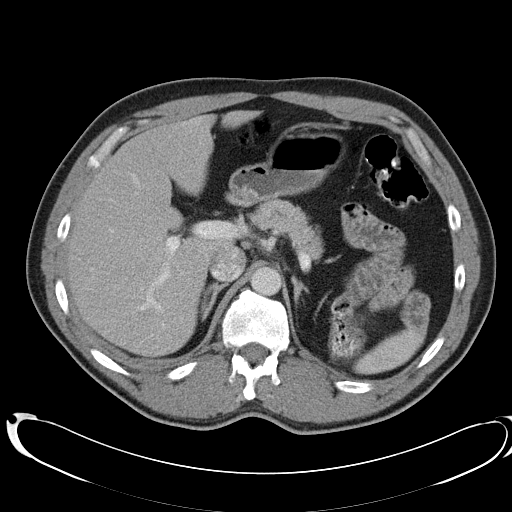
[im 95/142  soft-tissue]
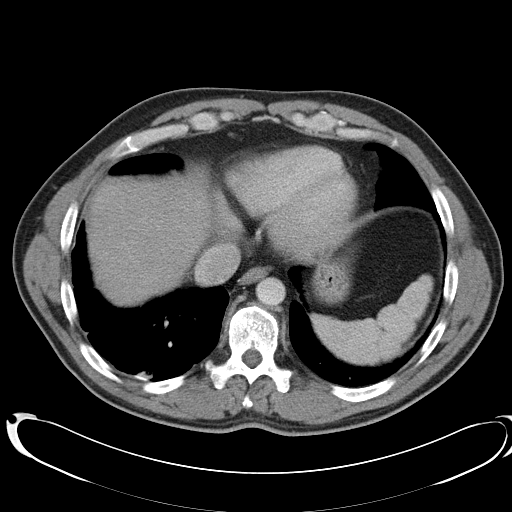
[im 95/142  bone]
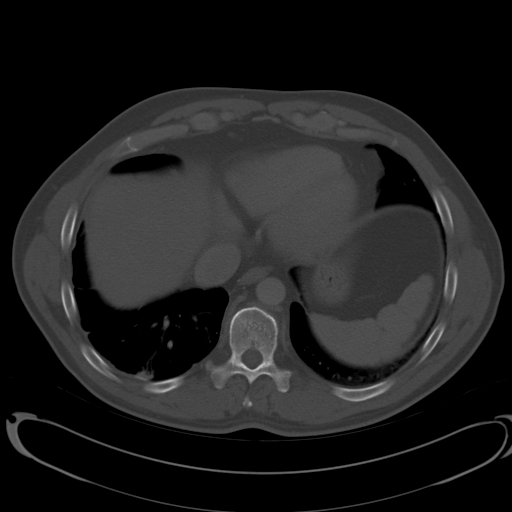
[im 102/142  soft-tissue]
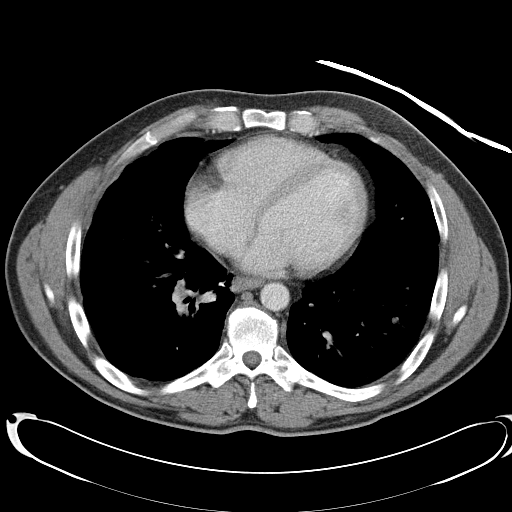
[im 110/142  soft-tissue]
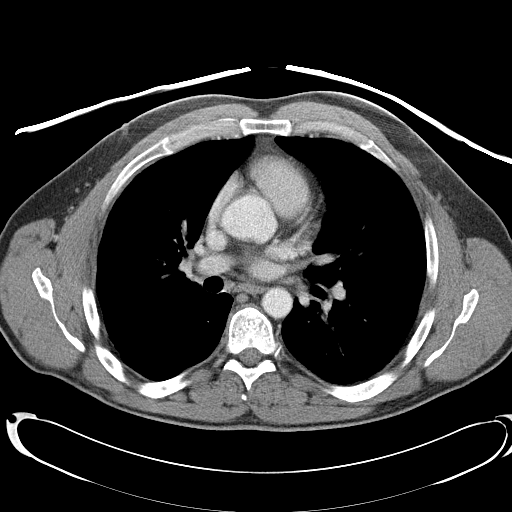
[im 126/142  soft-tissue]
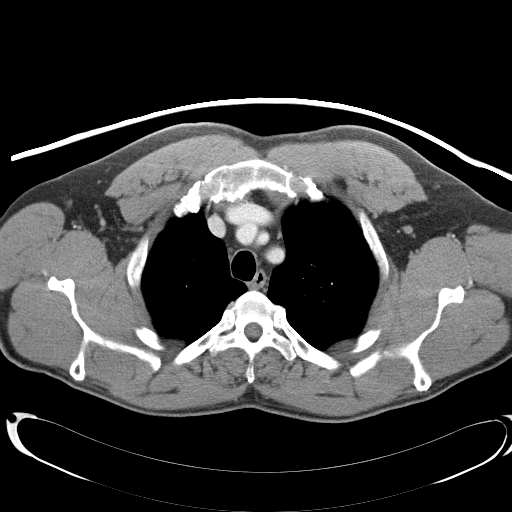
[im 134/142  soft-tissue]
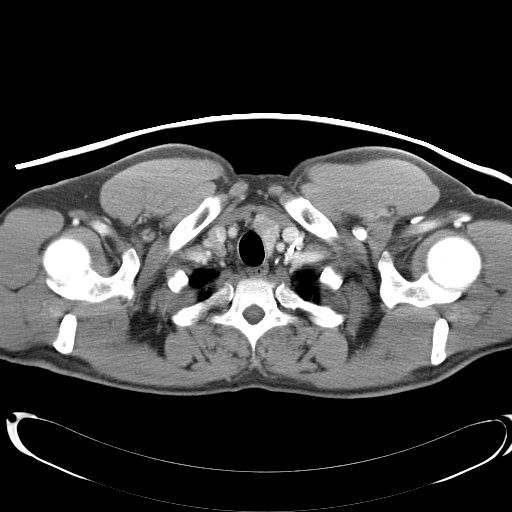

[Series 602: cor c/a/p · coronal · 1.38mm/px · 3 of 99 slices shown]
[im 33/99  soft-tissue]
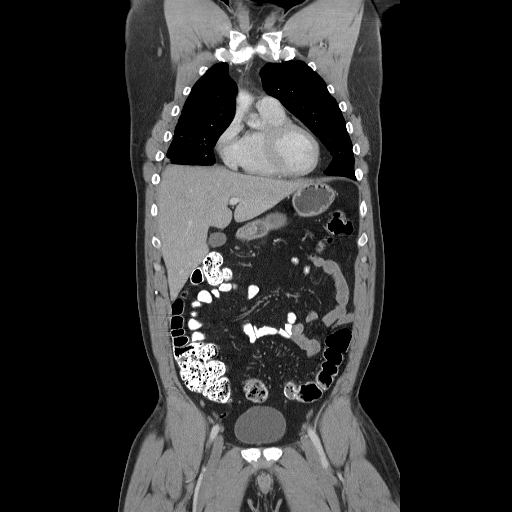
[im 44/99  soft-tissue]
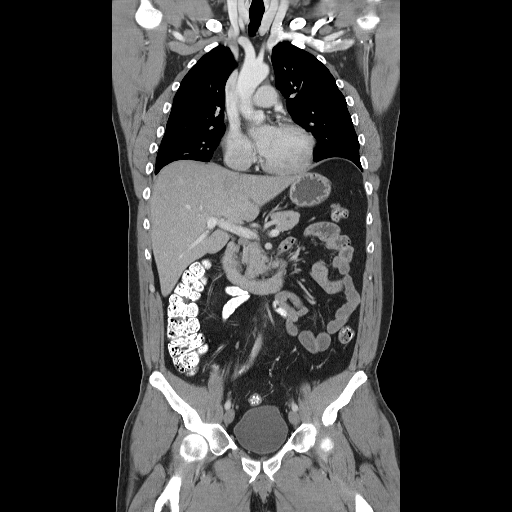
[im 55/99  soft-tissue]
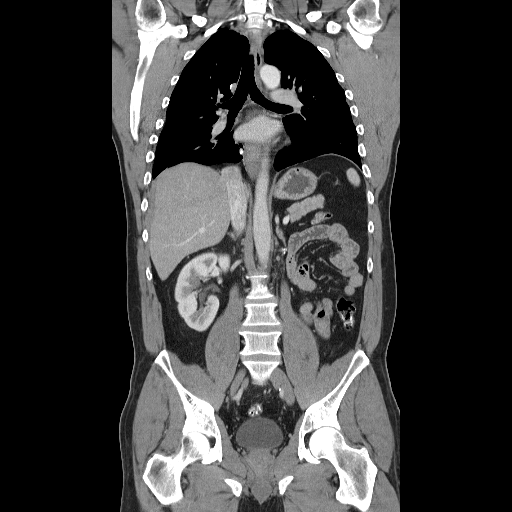

[16 of 46 positions shown; findings below may reference images not displayed]

FINDINGS: Lung windows demonstrate mild to moderate centrilobular
emphysema .  A new right upper lobe lung nodule measures 1.2 x
cm on image 23 and 1.1 cm cranial caudal on image 49 coronal.
Right upper lobe lung nodule measures 4 mm on image 21 and is new.
Right lung base nodule, likely in the right middle lobe measures 6
mm on image 40 transverse and 37 sagittal.  New.

Volume loss in the right lung base.  Improved patchy right base
atelectasis

Soft tissue windows demonstrate normal heart size.  No pericardial
effusion.  No left-sided pleural effusion.  Resolution of loculated
right-sided pleural fluid.  Minimal right-sided postoperative
pleural thickening remains.  Resolution of previous described
borderline azygo-esophageal recess adenopathy.

No hilar adenopathy.
IMPRESSION: 1.  3 right-sided lung nodules which are new since the prior exam.
Considerations include metastatic disease (from either of the 2
primary malignancies) or less likely metachronous primary
bronchogenic carcinoma or carcinomas.  Consider further evaluation
with PET or tissue sampling of the largest right upper lobe nodule.
This impression was called to the clinical service on the morning
of  01/04/2009.
2.  No evidence of thoracic nodal metastasis.
3.  Resolution of right-sided pleural fluid.

CT ABDOMEN AND PELVIS
FINDINGS: Scattered too small to characterize hepatic lesions
which are unchanged.  Normal spleen, stomach and, pancreas,
gallbladder, biliary tract, adrenal glands, right kidney.  Status
post left nephrectomy .  No change in a 6 mm nodule adjacent the
medial aspect of the nephrectomy bed on image 68.

Normal adrenal glands No retroperitoneal or retrocrural adenopathy.

Normal colon, appendix, and terminal ileum.  Normal small bowel
without ascites.

  No pelvic adenopathy.    Normal urinary bladder and prostate.  No
significant free fluid.  Central lumbar canal stenosis, partially
secondary to congenitally short lumbar pedicles.  Similar.  Similar
partial fusion of the sacroiliac joints.  Scattered probable bone
islands within the pelvis.
IMPRESSION: 1. No acute process or evidence of metastatic disease in the
abdomen or pelvis.
2.  Size post left nephrectomy with a similar nodule adjacent the
nephrectomy bed.

## 2011-11-17 ENCOUNTER — Telehealth: Payer: Self-pay | Admitting: *Deleted

## 2011-11-17 NOTE — Telephone Encounter (Signed)
Received call from wife requesting cough medicine  For pt.   Spoke with wife  [name] and was informed that pt has had a cough for about 3 - 4 days  With clear mucus .   Per wife,  Pt did not have fever, no  SOB,  No congestion.   Pt is currently taking Sutent daily.    Dr.  [name] notified. Spoke with wife again ;  Instructed wife that  Pt can take OTC cough medicines ,  No antibiotics needed   As per Dr.  [name]..   Wife verbalized understanding.

## 2011-11-19 ENCOUNTER — Encounter: Payer: Self-pay | Admitting: *Deleted

## 2011-11-21 ENCOUNTER — Ambulatory Visit (HOSPITAL_COMMUNITY)
Admission: RE | Admit: 2011-11-21 | Discharge: 2011-11-21 | Disposition: A | Discharge status: Home / Self Care | Payer: PRIVATE HEALTH INSURANCE | Source: Ambulatory Visit | Attending: Oncology | Admitting: Oncology

## 2011-11-21 ENCOUNTER — Other Ambulatory Visit: Payer: Self-pay | Admitting: Oncology

## 2011-11-21 ENCOUNTER — Other Ambulatory Visit (HOSPITAL_BASED_OUTPATIENT_CLINIC_OR_DEPARTMENT_OTHER): Payer: PRIVATE HEALTH INSURANCE | Admitting: Lab

## 2011-11-21 ENCOUNTER — Encounter (HOSPITAL_COMMUNITY): Payer: Self-pay

## 2011-11-21 DIAGNOSIS — K8689 Other specified diseases of pancreas: Secondary | ICD-10-CM | POA: Insufficient documentation

## 2011-11-21 DIAGNOSIS — K7689 Other specified diseases of liver: Secondary | ICD-10-CM | POA: Insufficient documentation

## 2011-11-21 DIAGNOSIS — C7800 Secondary malignant neoplasm of unspecified lung: Secondary | ICD-10-CM | POA: Insufficient documentation

## 2011-11-21 DIAGNOSIS — C649 Malignant neoplasm of unspecified kidney, except renal pelvis: Secondary | ICD-10-CM

## 2011-11-21 DIAGNOSIS — C7889 Secondary malignant neoplasm of other digestive organs: Secondary | ICD-10-CM

## 2011-11-21 DIAGNOSIS — Z905 Acquired absence of kidney: Secondary | ICD-10-CM | POA: Insufficient documentation

## 2011-11-21 LAB — CBC WITH DIFFERENTIAL/PLATELET
BASO%: 0.2 % (ref 0.0–2.0)
Basophils Absolute: 0 10*3/uL (ref 0.0–0.1)
EOS%: 0.9 % (ref 0.0–7.0)
Eosinophils Absolute: 0.1 10*3/uL (ref 0.0–0.5)
HCT: 47.1 % (ref 38.4–49.9)
HGB: 16.1 g/dL (ref 13.0–17.1)
LYMPH%: 14.9 % (ref 14.0–49.0)
MCH: 36.5 pg — ABNORMAL HIGH (ref 27.2–33.4)
MCHC: 34.2 g/dL (ref 32.0–36.0)
MCV: 106.7 fL — ABNORMAL HIGH (ref 79.3–98.0)
MONO#: 0.4 10*3/uL (ref 0.1–0.9)
MONO%: 3.9 % (ref 0.0–14.0)
NEUT#: 8.5 10*3/uL — ABNORMAL HIGH (ref 1.5–6.5)
NEUT%: 80.1 % — ABNORMAL HIGH (ref 39.0–75.0)
Platelets: 194 10*3/uL (ref 140–400)
RBC: 4.42 10*6/uL (ref 4.20–5.82)
RDW: 14.8 % — ABNORMAL HIGH (ref 11.0–14.6)
WBC: 10.6 10*3/uL — ABNORMAL HIGH (ref 4.0–10.3)
lymph#: 1.6 10*3/uL (ref 0.9–3.3)

## 2011-11-21 LAB — CMP (CANCER CENTER ONLY)
ALT(SGPT): 60 U/L — ABNORMAL HIGH (ref 10–47)
AST: 36 U/L (ref 11–38)
Albumin: 3.6 g/dL (ref 3.3–5.5)
Alkaline Phosphatase: 102 U/L — ABNORMAL HIGH (ref 26–84)
BUN, Bld: 13 mg/dL (ref 7–22)
CO2: 30 mEq/L (ref 18–33)
Calcium: 9.1 mg/dL (ref 8.0–10.3)
Chloride: 98 mEq/L (ref 98–108)
Creat: 1.4 mg/dl — ABNORMAL HIGH (ref 0.6–1.2)
Glucose, Bld: 110 mg/dL (ref 73–118)
Potassium: 4.5 mEq/L (ref 3.3–4.7)
Sodium: 147 mEq/L — ABNORMAL HIGH (ref 128–145)
Total Bilirubin: 0.6 mg/dl (ref 0.20–1.60)
Total Protein: 8.3 g/dL — ABNORMAL HIGH (ref 6.4–8.1)

## 2011-11-21 MED ORDER — IOHEXOL 300 MG/ML  SOLN
100.0000 mL | Freq: Once | INTRAMUSCULAR | Status: AC | PRN
  Administered 2011-11-21: 100 mL via INTRAVENOUS

## 2011-11-24 ENCOUNTER — Telehealth: Payer: Self-pay | Admitting: *Deleted

## 2011-11-24 NOTE — Telephone Encounter (Signed)
Received call from wife [name] re:   Pt has been very depressed, highly anxious  Since after CT scans done 11/21/11.    Wife thinks pt is having an anxiety attack.   Per wife,  Pt has been sitting around crying,  Not doing much,  Thinks the cancer has gotten to him.  Wife wanted to know if Dr. [name] could see pt sooner than  Fri. 11/28/11.     Informed wife that message will be relayed to Dr. [name] on 11/25/11.     Encouraged [name] to also notify pt's primary for depression, anxiety issues.    [name] voiced understanding. Penny[facility]    Phone     [phone].

## 2011-11-28 ENCOUNTER — Ambulatory Visit (HOSPITAL_BASED_OUTPATIENT_CLINIC_OR_DEPARTMENT_OTHER): Payer: PRIVATE HEALTH INSURANCE | Admitting: Oncology

## 2011-11-28 VITALS — BP 133/95 | HR 92 | Temp 97.2°F | Ht 72.5 in | Wt 225.5 lb

## 2011-11-28 DIAGNOSIS — F3289 Other specified depressive episodes: Secondary | ICD-10-CM

## 2011-11-28 DIAGNOSIS — C7800 Secondary malignant neoplasm of unspecified lung: Secondary | ICD-10-CM

## 2011-11-28 DIAGNOSIS — C7889 Secondary malignant neoplasm of other digestive organs: Secondary | ICD-10-CM

## 2011-11-28 DIAGNOSIS — F329 Major depressive disorder, single episode, unspecified: Secondary | ICD-10-CM

## 2011-11-28 DIAGNOSIS — C649 Malignant neoplasm of unspecified kidney, except renal pelvis: Secondary | ICD-10-CM

## 2011-11-28 NOTE — Progress Notes (Signed)
Hematology and Oncology Follow Up Visit  Keith [name] [id number] Jun 10, 1966 45 y.o. 11/28/2011 4:17 PM  [name], MD  [name], M.D.  [name], MD  [name], MD    Principle Diagnosis: This is a 45 year old gentleman with stage IV renal cell carcinoma diagnosed in 2009.   Prior Therapy: 1. Status post laparoscopic radical nephrectomy.  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 2. Patient status post thoracotomy done October 2009.  He had a lower lobe nodule, biopsy proven to be metastatic renal cell carcinoma.   3. Patient is status post stereotactic radiotherapy to pulmonary nodules in May of 2010.  Current therapy: He is on Sutent 50 mg 4 weeks on 2 weeks off since November of 2011, has stable disease.   Interim History:  Keith [name] presents today for a followup visit.  He has tolerated Sutent very well.  He has grade 1 diarrhea and for the most part has been doing fairly well with that.  His last imaging studies did show a pancreatic lesion that was biopsy proven to be a renal cell metastasis.  He also had developed 1 episode of hematochezia associated with some slight abdominal discomfort.  He was evaluated in the emergency department and felt it was internal hemorrhoidal bleed, but since that episode he is not really reporting any hematochezia and does not report any melena.  His activities have been fully resumed at this time.  He is performing work-related duties and activities of daily living without any hindrance or decline.  He is not reporting any abdominal pain.  He did report grade 1-2 hand-foot syndrome.  Had not reported any other complications. He is reporting feeling depressed, down and very anxious at times. He has no homicidal or suicidal ideation.   Medications: I have reviewed the patient's current medications. Current outpatient prescriptions:acetaminophen (TYLENOL) 325 MG tablet, Take 650 mg by mouth as needed.  , Disp: ,  Rfl: ;  ALPRAZolam (XANAX) 1 MG tablet, As needed , Disp: , Rfl: ;  AXIRON 30 MG/ACT SOLN, One tablet by mouth once daily , Disp: , Rfl: ;  oxycodone (OXY-IR) 5 MG capsule, Take 5 mg by mouth every 4 (four) hours as needed.  , Disp: , Rfl: ;  SUTENT 50 MG capsule, One tablet by mouth once daily , Disp: , Rfl:   Allergies:  Allergies  Allergen Reactions  . Ceftriaxone   . Hydrocodone     Past Medical History, Surgical history, Social history, and Family History were reviewed and updated.  Review of Systems: Constitutional:  Negative for fever, chills, night sweats, anorexia, weight loss, pain. Cardiovascular: no chest pain or dyspnea on exertion Respiratory: no cough, shortness of breath, or wheezing Neurological: no TIA or stroke symptoms Dermatological: negative ENT: negative Skin: Negative. Gastrointestinal: no abdominal pain, change in bowel habits, or black or bloody stools Genito-Urinary: no dysuria, trouble voiding, or hematuria Hematological and Lymphatic: negative Breast: negative Musculoskeletal: negative for - joint swelling, muscle pain or pain in foot - bilateral Remaining ROS negative. Physical Exam: Blood pressure 133/95, pulse 92, temperature 97.2 F (36.2 C), temperature source Oral, height 6' 0.5" (1.842 m), weight 225 lb 8 oz (102.286 kg). ECOG: 0 General appearance: alert Head: Normocephalic, without obvious abnormality, atraumatic Neck: no adenopathy, no carotid bruit, no JVD, supple, symmetrical, trachea midline and thyroid not enlarged, symmetric, no tenderness/mass/nodules Lymph nodes: Cervical, supraclavicular, and axillary nodes normal. Heart:regular rate and rhythm, S1, S2 normal, no murmur,  click, rub or gallop Lung:chest clear, no wheezing, rales, normal symmetric air entry Abdomin: soft, non-tender, without masses or organomegaly EXT:no edema, no despumation.   Lab Results: Lab Results  Component Value Date   WBC 10.6* 11/21/2011   HGB 16.1  11/21/2011   HCT 47.1 11/21/2011   MCV 106.7* 11/21/2011   PLT 194 11/21/2011     Chemistry      Component Value Date/Time   NA 147* 11/21/2011 0843   NA 140 10/17/2011 0846   K 4.5 11/21/2011 0843   K 4.2 10/17/2011 0846   CL 98 11/21/2011 0843   CL 105 10/17/2011 0846   CO2 30 11/21/2011 0843   CO2 29 10/17/2011 0846   BUN 13 11/21/2011 0843   BUN 13 10/17/2011 0846   CREATININE 1.4* 11/21/2011 0843   CREATININE 1.26 10/17/2011 0846      Component Value Date/Time   CALCIUM 9.1 11/21/2011 0843   CALCIUM 8.9 10/17/2011 0846   ALKPHOS 102* 11/21/2011 0843   ALKPHOS 78 10/17/2011 0846   AST 36 11/21/2011 0843   AST 30 10/17/2011 0846   ALT 40 10/17/2011 0846   BILITOT 0.60 11/21/2011 0843   BILITOT 0.5 10/17/2011 0846       Radiological Studies: 11 CT CHEST  Findings: The volume loss and perihilar nodularity in the right  lung are unchanged, attributed to radiation fibrosis. Tiny  subpleural density in the right upper lobe on image 26 is stable.  There are no new or enlarging pulmonary nodules. Emphysematous  changes are stable.  No enlarged mediastinal or hilar lymph nodes are identified. There  is stable pleural thickening on the right. No significant pleural  or pericardial effusion is present. There are no suspicious  osseous findings.  IMPRESSION:  Stable chest CT with right perihilar radiation changes. No  evidence of metastatic disease.  CT ABDOMEN AND PELVIS IMPRESSION:  1. There are multiple enhancing pancreatic masses associated with  pancreatic ductal dilatation and atrophy. These lesions are more  obvious on the current study which includes arterial phase images.  As correlated with prior studies, these lesions appear to be slowly  growing and are consistent with renal cell metastases per previous  biopsy. 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  2. No other evidence of metastatic disease.    Impression and  Plan:  This is a pleasant 46 year old gentleman with the following issues. 1. Metastatic renal cell carcinoma.  He has documented disease to the lung and the pancreas. CT scans showed for the most part stable. He tolerating Sutent well. He does have low grade hand-foot syndrome. If this does get worse, will dose reduce him to 37.5 mg 2. Pancreatic thickening/mass.  This has been biopsy proven to be a renal cell metastasis.  Really no change in treatment at this time. 3. Thrombocytopenia related to Sutent  no need for any dose reduction or delay. 4. Hematochezia, probably hemorrhoidal bleed.  Stable. 5. Depression/Anxiety: I will start him on Cymbalta with Xanax. I offered him referral to our psychologiest; Dr. Ferdinand Lango but he declined. 6. Follow up in 6 weeks.       Zola Button, MD 12/7/20124:17 PM

## 2011-12-02 ENCOUNTER — Other Ambulatory Visit: Payer: Self-pay | Admitting: *Deleted

## 2011-12-02 DIAGNOSIS — F419 Anxiety disorder, unspecified: Secondary | ICD-10-CM

## 2011-12-02 MED ORDER — ALPRAZOLAM 1 MG PO TABS: 1.0000 mg | ORAL_TABLET | Freq: Three times a day (TID) | ORAL | Status: DC

## 2011-12-12 ENCOUNTER — Other Ambulatory Visit: Payer: Self-pay | Admitting: *Deleted

## 2011-12-12 ENCOUNTER — Encounter: Payer: Self-pay | Admitting: *Deleted

## 2011-12-12 NOTE — Progress Notes (Signed)
RECEIVED A FAX FROM [facility] CONCERNING A CONFIRMATION OF FACSIMILE RECEIPT. 

## 2011-12-12 NOTE — Telephone Encounter (Signed)
THIS REQUEST WAS PLACED IN DR.SHADAD'S RED FOLDER.

## 2011-12-18 ENCOUNTER — Other Ambulatory Visit: Payer: Self-pay | Admitting: *Deleted

## 2011-12-18 DIAGNOSIS — C649 Malignant neoplasm of unspecified kidney, except renal pelvis: Secondary | ICD-10-CM

## 2011-12-18 MED ORDER — SUNITINIB MALATE 50 MG PO CAPS: 50.0000 mg | ORAL_CAPSULE | Freq: Every day | ORAL | Status: DC

## 2011-12-18 NOTE — Telephone Encounter (Signed)
[facility] faxed confirmation of prescription shipment.  Shipped 12-17-11 with next business day delivery.

## 2011-12-24 ENCOUNTER — Telehealth: Payer: Self-pay | Admitting: *Deleted

## 2011-12-24 ENCOUNTER — Other Ambulatory Visit: Payer: Self-pay | Admitting: *Deleted

## 2011-12-24 MED ORDER — OXYCODONE HCL 5 MG PO CAPS: 5.0000 mg | ORAL_CAPSULE | ORAL | Status: DC | PRN

## 2011-12-24 NOTE — Telephone Encounter (Signed)
On call provider, dr. Marko Plume approved this refill.  Called and notified penny to pick up prescription.

## 2011-12-24 NOTE — Telephone Encounter (Signed)
Reports pain under right arm where he had a tube when lung removed.  Had a pain medicine and would like this refilled because tylenol is not working.  Unable to tell the name of medicine.  Noted Oxy-IR 5mg   Take 1 po q4 hrs prn pain on med list.  Will notify providers.

## 2011-12-31 ENCOUNTER — Emergency Department (HOSPITAL_COMMUNITY): Payer: PRIVATE HEALTH INSURANCE

## 2011-12-31 ENCOUNTER — Other Ambulatory Visit: Payer: Self-pay

## 2011-12-31 ENCOUNTER — Emergency Department (HOSPITAL_COMMUNITY)
Admission: EM | Admit: 2011-12-31 | Discharge: 2011-12-31 | Disposition: A | Discharge status: Home / Self Care | Payer: PRIVATE HEALTH INSURANCE | Attending: Emergency Medicine | Admitting: Emergency Medicine

## 2011-12-31 ENCOUNTER — Encounter (HOSPITAL_COMMUNITY): Payer: Self-pay | Admitting: Emergency Medicine

## 2011-12-31 DIAGNOSIS — C649 Malignant neoplasm of unspecified kidney, except renal pelvis: Secondary | ICD-10-CM | POA: Insufficient documentation

## 2011-12-31 DIAGNOSIS — Z79899 Other long term (current) drug therapy: Secondary | ICD-10-CM | POA: Insufficient documentation

## 2011-12-31 DIAGNOSIS — R071 Chest pain on breathing: Secondary | ICD-10-CM | POA: Insufficient documentation

## 2011-12-31 DIAGNOSIS — R0789 Other chest pain: Secondary | ICD-10-CM

## 2011-12-31 DIAGNOSIS — C7800 Secondary malignant neoplasm of unspecified lung: Secondary | ICD-10-CM | POA: Insufficient documentation

## 2011-12-31 DIAGNOSIS — R079 Chest pain, unspecified: Secondary | ICD-10-CM | POA: Insufficient documentation

## 2011-12-31 DIAGNOSIS — C7889 Secondary malignant neoplasm of other digestive organs: Secondary | ICD-10-CM | POA: Insufficient documentation

## 2011-12-31 DIAGNOSIS — R0602 Shortness of breath: Secondary | ICD-10-CM | POA: Insufficient documentation

## 2011-12-31 LAB — CBC
HCT: 44.8 % (ref 39.0–52.0)
Hemoglobin: 15.2 g/dL (ref 13.0–17.0)
MCH: 35.7 pg — ABNORMAL HIGH (ref 26.0–34.0)
MCHC: 33.9 g/dL (ref 30.0–36.0)
MCV: 105.2 fL — ABNORMAL HIGH (ref 78.0–100.0)
Platelets: 201 10*3/uL (ref 150–400)
RBC: 4.26 MIL/uL (ref 4.22–5.81)
RDW: 13.6 % (ref 11.5–15.5)
WBC: 7.6 10*3/uL (ref 4.0–10.5)

## 2011-12-31 LAB — BASIC METABOLIC PANEL
BUN: 15 mg/dL (ref 6–23)
CO2: 24 mEq/L (ref 19–32)
Calcium: 9.3 mg/dL (ref 8.4–10.5)
Chloride: 99 mEq/L (ref 96–112)
Creatinine, Ser: 1.28 mg/dL (ref 0.50–1.35)
GFR calc Af Amer: 77 mL/min — ABNORMAL LOW (ref >90)
GFR calc non Af Amer: 67 mL/min — ABNORMAL LOW (ref >90)
Glucose, Bld: 102 mg/dL — ABNORMAL HIGH (ref 70–99)
Potassium: 3.9 mEq/L (ref 3.5–5.1)
Sodium: 136 mEq/L (ref 135–145)

## 2011-12-31 LAB — CARDIAC PANEL(CRET KIN+CKTOT+MB+TROPI)
CK, MB: 6.6 ng/mL (ref 0.3–4.0)
Relative Index: 1.4 (ref 0.0–2.5)
Total CK: 480 U/L — ABNORMAL HIGH (ref 7–232)
Troponin I: <0.3 ng/mL (ref <0.30)

## 2011-12-31 MED ORDER — HYDROMORPHONE HCL PF 1 MG/ML IJ SOLN
1.0000 mg | Freq: Once | INTRAMUSCULAR | Status: AC
  Administered 2011-12-31: 1 mg via INTRAVENOUS
  Filled 2011-12-31: qty 1

## 2011-12-31 MED ORDER — IOHEXOL 300 MG/ML  SOLN
100.0000 mL | Freq: Once | INTRAMUSCULAR | Status: AC | PRN
  Administered 2011-12-31: 100 mL via INTRAVENOUS

## 2011-12-31 MED ORDER — HYDROMORPHONE HCL 2 MG PO TABS: 2.0000 mg | ORAL_TABLET | ORAL | Status: DC | PRN

## 2011-12-31 NOTE — ED Notes (Signed)
Pt presents with a c/c of chest pain and shortness of breath x 3 weeks.  Pt has ca, had a partial lobectomy (took out his R upper lobe).  St's the pain starts when he takes his chemo meds but usually goes away, this time it hasn't.  St's the pain is in the right side of his chest, 10/10 and doesn't radiate.  Symptoms associated with SOB and some dizziness/lightheadedness.  Pain unrelieved by rest.

## 2011-12-31 NOTE — ED Notes (Signed)
CK-MB 6.6, MD is aware

## 2011-12-31 NOTE — ED Provider Notes (Signed)
History     CSN: [id number]  Arrival date & time 12/31/11  0608   First MD Initiated Contact with Patient 12/31/11 [phone]      Chief Complaint  Patient presents with  . Shortness of Breath  . Chest Pain    Right axillary Pain Hx Renal Cell Carcinoma, Mets to Lungs and Pancreas    (Consider location/radiation/quality/duration/timing/severity/associated sxs/prior treatment) Patient is a 46 y.o. male presenting with shortness of breath and chest pain. The history is provided by the patient and the spouse.  Shortness of Breath  The current episode started today. The problem occurs continuously. The problem has been unchanged. The problem is severe. The symptoms are relieved by nothing. The symptoms are aggravated by activity (Worse with breathing, causing shortness of breath.). Associated symptoms include chest pain and shortness of breath. Pertinent negatives include no fever and no cough. Past medical history comments: He has a history of lung cancer which is metastatic from pancreas. Right lobectomy in 2009 with intermittent pain - nothing like this. He is currently on chemotherapy. No recent radiation.. There were no sick contacts.  Chest Pain Primary symptoms include shortness of breath. Pertinent negatives for primary symptoms include no fever and no cough. Past medical history comments: He has a history of lung cancer which is metastatic from pancreas. Right lobectomy in 2009 with intermittent pain - nothing like this. He is currently on chemotherapy. No recent radiation.     Past Medical History  Diagnosis Date  . Pancreatic mass   . Renal cell cancer     renal cell ca dx 06/2008  . Metastasis to lung dx'd 06/2008  . Metastasis  to pancreas dx'd 10/2011    Past Surgical History  Procedure Date  . Nephrectomy radical     Left   . Lung removal, partial 09/2008    Family History  Problem Relation Age of Onset  . Diabetes Brother   . Irritable bowel syndrome Sister   . Colon  cancer Neg Hx     History  Substance Use Topics  . Smoking status: Current Everyday Smoker    Types: Cigarettes  . Smokeless tobacco: Never Used  . Alcohol Use: No      Review of Systems  Constitutional: Negative for fever and chills.  HENT: Negative.   Respiratory: Positive for shortness of breath. Negative for cough.   Cardiovascular: Positive for chest pain.  Gastrointestinal: Negative.   Musculoskeletal: Negative.   Skin: Negative.   Neurological: Negative.     Allergies  Ceftriaxone and Hydrocodone  Home Medications   Current Outpatient Rx  Name Route Sig Dispense Refill  . ACETAMINOPHEN 325 MG PO TABS Oral Take 650 mg by mouth as needed.      . ALPRAZOLAM 1 MG PO TABS Oral Take 1 tablet (1 mg total) by mouth every 8 (eight) hours. As needed 60 tablet 1  . DULOXETINE HCL 30 MG PO CPEP Oral Take 30 mg by mouth daily.      . OXYCODONE HCL 5 MG PO CAPS Oral Take 1 capsule (5 mg total) by mouth every 4 (four) hours as needed. 20 capsule 0  . PSEUDOEPHEDRINE-ACETAMINOPHEN 30-500 MG PO TABS Oral Take 1 tablet by mouth every 4 (four) hours as needed.    . SUNITINIB MALATE 50 MG PO CAPS Oral Take 1 capsule (50 mg total) by mouth daily. One tablet by mouth once daily, four weeks on followed by two weeks off 24 capsule 0  . AXIRON 30 MG/ACT  TD SOLN  One tablet by mouth once daily       BP 147/96  Pulse 76  Temp(Src) 97 F (36.1 C) (Oral)  Resp 16  Wt 220 lb (99.791 kg)  SpO2 100%  Physical Exam  Constitutional: He appears well-developed and well-nourished.  HENT:  Head: Normocephalic.  Neck: Normal range of motion. Neck supple.  Cardiovascular: Normal rate and regular rhythm.   Pulmonary/Chest: He exhibits tenderness.       Splinting on respirations. Pain prevents deep inspiration, as such lung sounds are difficult to assess. Well healed right mid-lateral chest wall surgical scars. Point tenderness to this area at axillary line. No SQ emphysema, discoloration or  swelling.  Abdominal: Soft. Bowel sounds are normal. There is no tenderness. There is no rebound and no guarding.  Musculoskeletal: Normal range of motion.  Neurological: He is alert. No cranial nerve deficit.  Skin: Skin is warm and dry. No rash noted.  Psychiatric: He has a normal mood and affect.    ED Course  Procedures (including critical care time)   Labs Reviewed  CARDIAC PANEL(CRET KIN+CKTOT+MB+TROPI)  CBC  BASIC METABOLIC PANEL   No results found.   No diagnosis found.    MDM  Pain is improved - from 10 to a 4. Negative x-rays, including CT of chest, negative blood studies with exception of elevated CK. Normal troponin and nonacute EKG with pain that is atypical for cardiac pain. Feel he can be discharged home to follow up with Dr. [name].        Leotis Shames, PA-C 12/31/11 1145

## 2011-12-31 NOTE — ED Provider Notes (Signed)
Medical screening examination/treatment/procedure(s) were conducted as a shared visit with non-physician practitioner(s) and myself.  I personally evaluated the patient during the encounter 46 year old, male, with a history of pancreatic cancer, who is actively getting treatment with chemotherapy, presents to emergency department with right-sided chest pain, which has been progressive.  He denies fevers, chills, cough, shortness of breath.  He has not had any trauma.  He has had a partial lobectomy because of metastatic cancer to his right lung.  On physical examination.  He appears uncomfortable, but his lungs are clear to auscultation with unlabored respirations.  Chest x-ray is negative.  We will perform a CAT scan within a few looking for pulmonary embolism.  Elmer Picker, MD 12/31/11 [phone]

## 2011-12-31 NOTE — ED Notes (Signed)
Pt alert, nad, c/o sob, right axillary pain, hx Mets to Lungs and pancreas, resp even unlabored, skin pwd

## 2012-01-01 NOTE — ED Provider Notes (Signed)
Medical screening examination/treatment/procedure(s) were performed by non-physician practitioner and as supervising physician I was immediately available for consultation/collaboration.  Elmer Picker, MD 01/01/12 [phone]

## 2012-01-09 ENCOUNTER — Other Ambulatory Visit: Payer: Self-pay | Admitting: *Deleted

## 2012-01-09 ENCOUNTER — Telehealth: Payer: Self-pay | Admitting: Oncology

## 2012-01-09 ENCOUNTER — Ambulatory Visit (HOSPITAL_BASED_OUTPATIENT_CLINIC_OR_DEPARTMENT_OTHER): Payer: PRIVATE HEALTH INSURANCE | Admitting: Oncology

## 2012-01-09 ENCOUNTER — Other Ambulatory Visit: Payer: PRIVATE HEALTH INSURANCE | Admitting: Lab

## 2012-01-09 VITALS — BP 132/88 | HR 62 | Temp 97.2°F | Ht 72.5 in | Wt 225.3 lb

## 2012-01-09 DIAGNOSIS — D696 Thrombocytopenia, unspecified: Secondary | ICD-10-CM

## 2012-01-09 DIAGNOSIS — C649 Malignant neoplasm of unspecified kidney, except renal pelvis: Secondary | ICD-10-CM

## 2012-01-09 DIAGNOSIS — L270 Generalized skin eruption due to drugs and medicaments taken internally: Secondary | ICD-10-CM

## 2012-01-09 DIAGNOSIS — F341 Dysthymic disorder: Secondary | ICD-10-CM

## 2012-01-09 DIAGNOSIS — C7800 Secondary malignant neoplasm of unspecified lung: Secondary | ICD-10-CM

## 2012-01-09 DIAGNOSIS — C7889 Secondary malignant neoplasm of other digestive organs: Secondary | ICD-10-CM

## 2012-01-09 DIAGNOSIS — K921 Melena: Secondary | ICD-10-CM

## 2012-01-09 LAB — CBC WITH DIFFERENTIAL/PLATELET
BASO%: 1 % (ref 0.0–2.0)
Basophils Absolute: 0.1 10*3/uL (ref 0.0–0.1)
EOS%: 1.7 % (ref 0.0–7.0)
Eosinophils Absolute: 0.1 10*3/uL (ref 0.0–0.5)
HCT: 43.5 % (ref 38.4–49.9)
HGB: 15 g/dL (ref 13.0–17.1)
LYMPH%: 18.8 % (ref 14.0–49.0)
MCH: 36.7 pg — ABNORMAL HIGH (ref 27.2–33.4)
MCHC: 34.4 g/dL (ref 32.0–36.0)
MCV: 106.7 fL — ABNORMAL HIGH (ref 79.3–98.0)
MONO#: 0.3 10*3/uL (ref 0.1–0.9)
MONO%: 3.6 % (ref 0.0–14.0)
NEUT#: 5.2 10*3/uL (ref 1.5–6.5)
NEUT%: 74.9 % (ref 39.0–75.0)
Platelets: 133 10*3/uL — ABNORMAL LOW (ref 140–400)
RBC: 4.08 10*6/uL — ABNORMAL LOW (ref 4.20–5.82)
RDW: 15.3 % — ABNORMAL HIGH (ref 11.0–14.6)
WBC: 6.9 10*3/uL (ref 4.0–10.3)
lymph#: 1.3 10*3/uL (ref 0.9–3.3)

## 2012-01-09 LAB — COMPREHENSIVE METABOLIC PANEL
ALT: 51 U/L (ref 0–53)
AST: 36 U/L (ref 0–37)
Albumin: 4.7 g/dL (ref 3.5–5.2)
Alkaline Phosphatase: 91 U/L (ref 39–117)
BUN: 13 mg/dL (ref 6–23)
CO2: 24 mEq/L (ref 19–32)
Calcium: 9 mg/dL (ref 8.4–10.5)
Chloride: 105 mEq/L (ref 96–112)
Creatinine, Ser: 1.35 mg/dL (ref 0.50–1.35)
Glucose, Bld: 84 mg/dL (ref 70–99)
Potassium: 4.3 mEq/L (ref 3.5–5.3)
Sodium: 139 mEq/L (ref 135–145)
Total Bilirubin: 0.4 mg/dL (ref 0.3–1.2)
Total Protein: 7.4 g/dL (ref 6.0–8.3)

## 2012-01-09 LAB — LACTATE DEHYDROGENASE: LDH: 206 U/L (ref 94–250)

## 2012-01-09 MED ORDER — HYDROMORPHONE HCL 2 MG PO TABS: 2.0000 mg | ORAL_TABLET | ORAL | Status: AC | PRN

## 2012-01-09 MED ORDER — GABAPENTIN 300 MG PO CAPS: 300.0000 mg | ORAL_CAPSULE | Freq: Every evening | ORAL | Status: DC | PRN

## 2012-01-09 NOTE — Telephone Encounter (Signed)
Gave pt calendar for March appt, also gave pt oral contrast. Called pt on cell phone,left message for CT on 2/22 [facility], NPO 4 hrs prior to scan

## 2012-01-09 NOTE — Progress Notes (Signed)
Hematology and Oncology Follow Up Visit  Keith [name] [id number] Dec 28, 1965 46 y.o. 01/09/2012 9:45 AM  Keith Bring, MD  [name], M.D.  [name], MD  [name], MD    Principle Diagnosis: This is a 46 year old gentleman with stage IV renal cell carcinoma diagnosed in 2009.   Prior Therapy: 1. Status post laparoscopic radical nephrectomy.  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 2. Patient status post thoracotomy done October 2009.  He had a lower lobe nodule, biopsy proven to be metastatic renal cell carcinoma.   3. Patient is status post stereotactic radiotherapy to pulmonary nodules in May of 2010.  Current therapy: He is on Sutent 50 mg 4 weeks on 2 weeks off since November of 2011, has stable disease. His disease include pancreatic involvement of RCC.   Interim History:  [name] presents today for a followup visit.  He has tolerated Sutent very well.  He has grade 1 diarrhea and for the most part has been doing fairly well with that.  His last imaging studies did show a pancreatic lesion that was biopsy proven to be a renal cell metastasist.  He was evaluated in the emergency department for an episode of right side chest pain. CT scan showed no evidence of a PE. It was felt to be chest wall pain. He it still reporting constant, sharp, pain in the chest wall. Not associated with sob or breathing difficulty. Dilaudid have helped the pain at this point. He also reports some neuropathic components to this pain.  He is not reporting any abdominal pain.  He did report grade 1-2 hand-foot syndrome.  Had not reported any other complications. His mood is much improved on Cymbolta.    Medications: I have reviewed the patient's current medications. Current outpatient prescriptions:acetaminophen (TYLENOL) 325 MG tablet, Take 650 mg by mouth as needed.  , Disp: , Rfl: ;  ALPRAZolam (XANAX) 1 MG tablet, Take 1 tablet (1 mg total) by mouth every 8  (eight) hours. As needed, Disp: 60 tablet, Rfl: 1;  AXIRON 30 MG/ACT SOLN, One tablet by mouth once daily , Disp: , Rfl: ;  DULoxetine (CYMBALTA) 30 MG capsule, Take 30 mg by mouth daily.  , Disp: , Rfl:  HYDROmorphone (DILAUDID) 2 MG tablet, Take 1 tablet (2 mg total) by mouth every 4 (four) hours as needed for pain., Disp: 20 tablet, Rfl: 0;  oxycodone (OXY-IR) 5 MG capsule, Take 1 capsule (5 mg total) by mouth every 4 (four) hours as needed., Disp: 20 capsule, Rfl: 0;  pseudoephedrine-acetaminophen (TYLENOL SINUS) 30-500 MG TABS, Take 1 tablet by mouth every 4 (four) hours as needed., Disp: , Rfl:  SUNItinib (SUTENT) 50 MG capsule, Take 1 capsule (50 mg total) by mouth daily. One tablet by mouth once daily, four weeks on followed by two weeks off, Disp: 24 capsule, Rfl: 0  Allergies:  Allergies  Allergen Reactions  . Ceftriaxone   . Hydrocodone     Past Medical History, Surgical history, Social history, and Family History were reviewed and updated.  Review of Systems: Constitutional:  Negative for fever, chills, night sweats, anorexia, weight loss, pain. Cardiovascular: no chest pain or dyspnea on exertion Respiratory: no cough, shortness of breath, or wheezing Neurological: no TIA or stroke symptoms Dermatological: negative ENT: negative Skin: Negative. Gastrointestinal: no abdominal pain, change in bowel habits, or black or bloody stools Genito-Urinary: no dysuria, trouble voiding, or hematuria Hematological and Lymphatic: negative Breast: negative Musculoskeletal: negative for -  joint swelling, muscle pain or pain in foot - bilateral Remaining ROS negative. Physical Exam: Blood pressure 132/88, pulse 62, temperature 97.2 F (36.2 C), temperature source Oral, height 6' 0.5" (1.842 m), weight 225 lb 4.8 oz (102.195 kg). ECOG: 0 General appearance: alert Head: Normocephalic, without obvious abnormality, atraumatic Neck: no adenopathy, no carotid bruit, no JVD, supple, symmetrical,  trachea midline and thyroid not enlarged, symmetric, no tenderness/mass/nodules Lymph nodes: Cervical, supraclavicular, and axillary nodes normal. Heart:regular rate and rhythm, S1, S2 normal, no murmur, click, rub or gallop Lung:chest clear, no wheezing, rales, normal symmetric air entry. Chest wall pain is reproducible with pressure. Abdomin: soft, non-tender, without masses or organomegaly EXT:no edema, no despumation.   Lab Results: Lab Results  Component Value Date   WBC 6.9 01/09/2012   HGB 15.0 01/09/2012   HCT 43.5 01/09/2012   MCV 106.7* 01/09/2012   PLT 133* 01/09/2012     Chemistry      Component Value Date/Time   NA 136 12/31/2011 0645   NA 147* 11/21/2011 0843   K 3.9 12/31/2011 0645   K 4.5 11/21/2011 0843   CL 99 12/31/2011 0645   CL 98 11/21/2011 0843   CO2 24 12/31/2011 0645   CO2 30 11/21/2011 0843   BUN 15 12/31/2011 0645   BUN 13 11/21/2011 0843   CREATININE 1.28 12/31/2011 0645   CREATININE 1.4* 11/21/2011 0843      Component Value Date/Time   CALCIUM 9.3 12/31/2011 0645   CALCIUM 9.1 11/21/2011 0843   ALKPHOS 102* 11/21/2011 0843   ALKPHOS 78 10/17/2011 0846   AST 36 11/21/2011 0843   AST 30 10/17/2011 0846   ALT 40 10/17/2011 0846   BILITOT 0.60 11/21/2011 0843   BILITOT 0.5 10/17/2011 0846     CT ANGIOGRAPHY CHEST  Technique: Multidetector CT imaging of the chest using the  standard protocol during bolus administration of intravenous  contrast. Multiplanar reconstructed images including MIPs were  obtained and reviewed to evaluate the vascular anatomy.  Comparison: Chest radiograph dated 12/31/2011. CT chest dated  11/21/2011/  Findings: No evidence of pulmonary embolism.  Right upper lobe/perihilar radiation changes. Linear scarring  versus atelectasis in the right lower lobe. Paraseptal  emphysematous changes. No new/suspicious pulmonary nodules. No  pleural effusion or pneumothorax.  Visualized thyroid is unremarkable.  The heart is normal in size. No  pericardial effusion.  No suspicious mediastinal, hilar, or axillary lymphadenopathy.  Visualized upper abdomen is unremarkable.  Degenerative changes of the visualized thoracolumbar spine.  IMPRESSION:  No evidence of pulmonary embolism.  Stable right lung scarring/radiation changes. No evidence of  metastatic disease in the chest.  Underlying paraseptal emphysematous changes.   Impression and Plan:  This is a pleasant 46 year old gentleman with the following issues. 1. Metastatic renal cell carcinoma.  He has documented disease to the lung and the pancreas.  He tolerating Sutent well. He does have low grade hand-foot syndrome. The plan is to continue Sutent with current dose and schedule and will repeat CT of the abdomin and pelvis with the next visit. 2. Pancreatic thickening/mass.  This has been biopsy proven to be a renal cell metastasis. No change in treatment at this time. 3. Thrombocytopenia related to Sutent  no need for any dose reduction or delay. 4. Hematochezia, probably hemorrhoidal bleed.  Stable. 5. Depression/Anxiety: mood much improved on Cymbolta. 6. Chest wall pain: unclear etiology. His pain has a neuropathic component, I will start him on Gabapentin and use PRN dilaudid.  7. Follow  up in on 02/20/2012       [facility] [place], MD 1/18/20139:45 AM

## 2012-01-29 ENCOUNTER — Encounter: Payer: Self-pay | Admitting: *Deleted

## 2012-01-29 NOTE — Progress Notes (Signed)
ON 01/26/12 RECEIVED A FAX FROM [employer] CONCERNING A CONFIRMATION OF PRESCRIPTION SHIPMENT FOR SUTENT.

## 2012-01-31 IMAGING — CT CT CHEST W/ CM
2 of 5 series · 16 of 46 positions shown, 18 images · IV contrast (agent unspecified)
Comparison: 01/04/2010

 CT CHEST

March 29, 2010 –DUPLICATE COPY for exam association in [facility]. No change from original report.
CLINICAL DATA: Left renal cancer. Partial right lung resection.

 CT CHEST, ABDOMEN AND PELVIS WITH CONTRAST
TECHNIQUE: Multidetector CT imaging of the chest, abdomen and
 pelvis was performed following the standard protocol during bolus
 administration of intravenous contrast.
 Contrast: 100 ml 4mnipaque-MAA

[Series 2: cap with st · axial · 0.74mm/px · z∈[-708,-98]mm · 13 of 138 slices shown, 15 images]
[im 8/138  soft-tissue]
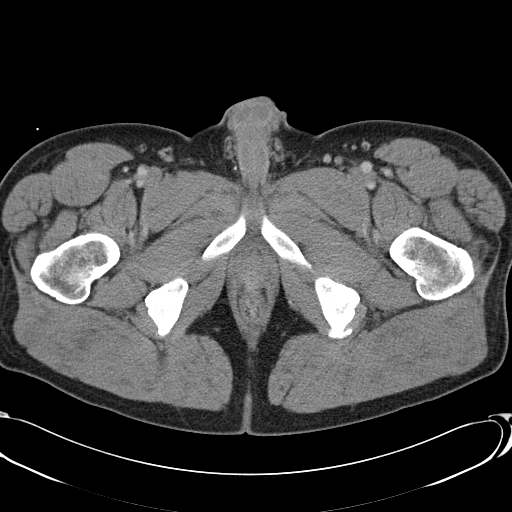
[im 8/138  bone]
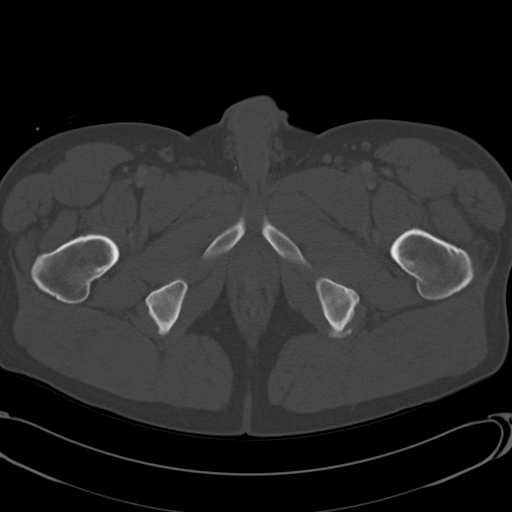
[im 16/138  soft-tissue]
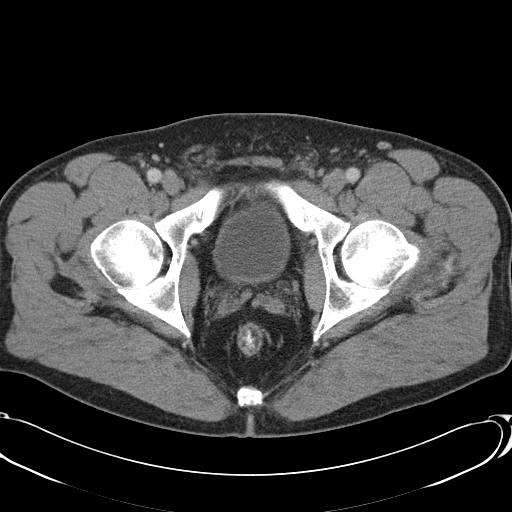
[im 31/138  soft-tissue]
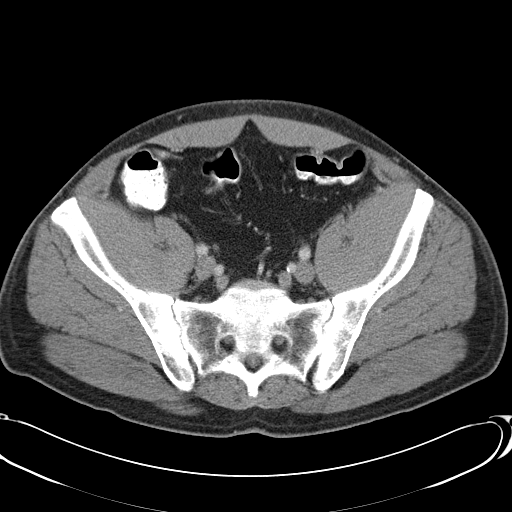
[im 39/138  soft-tissue]
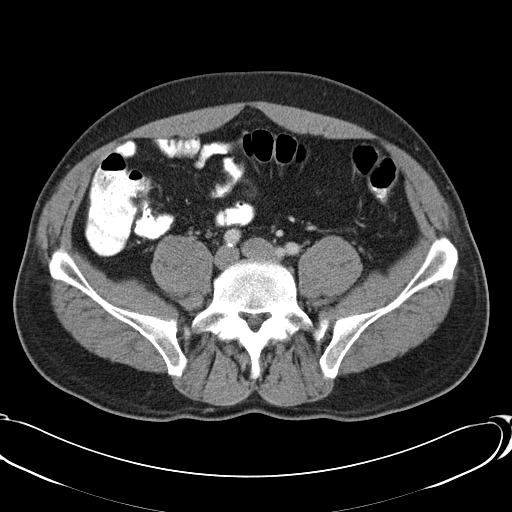
[im 46/138  soft-tissue]
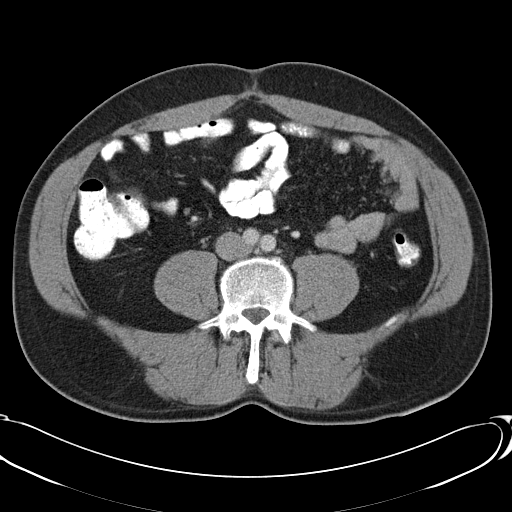
[im 61/138  soft-tissue]
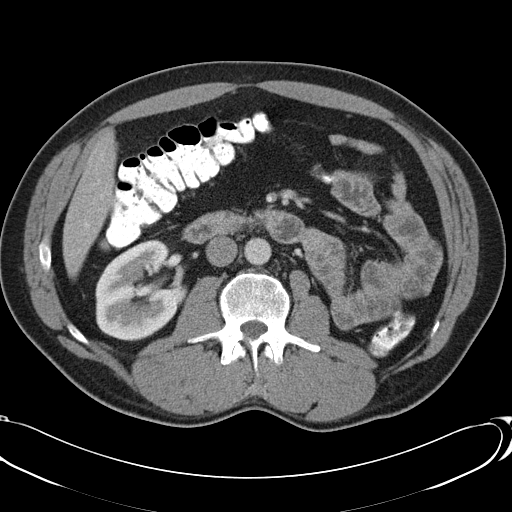
[im 69/138  soft-tissue]
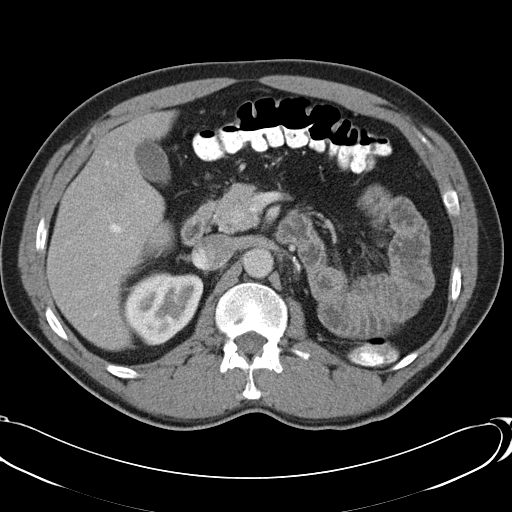
[im 77/138  soft-tissue]
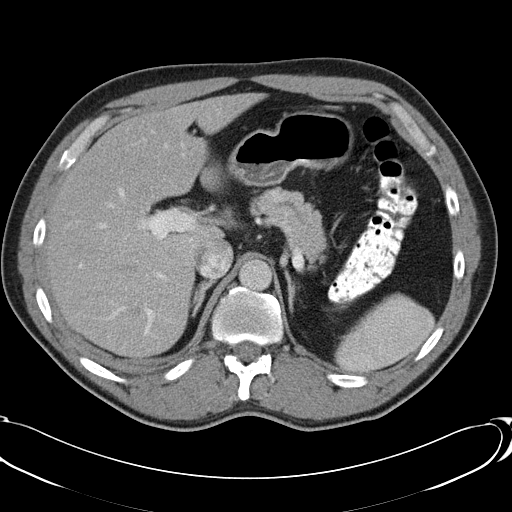
[im 92/138  soft-tissue]
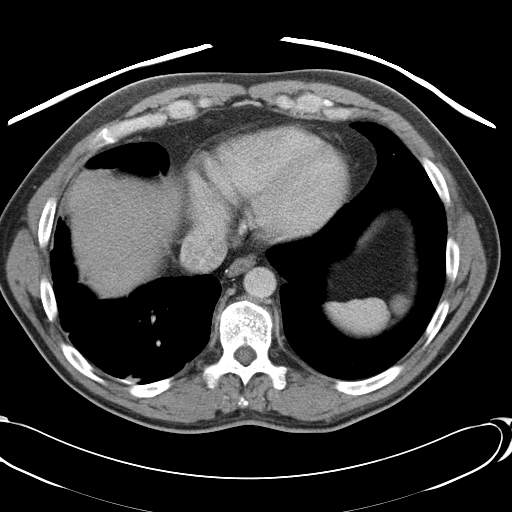
[im 92/138  bone]
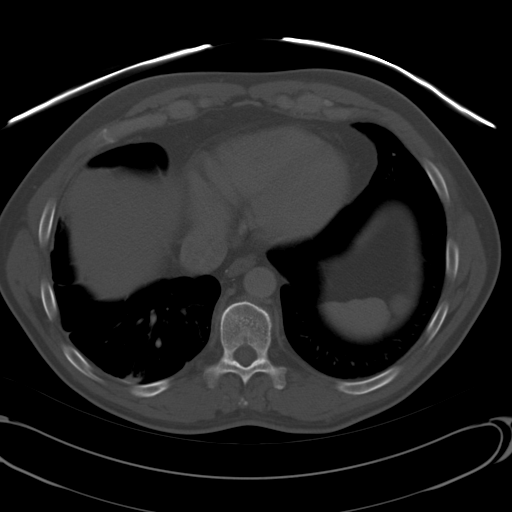
[im 99/138  soft-tissue]
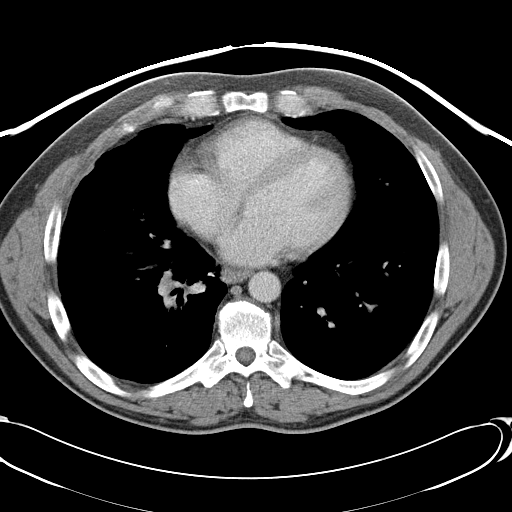
[im 107/138  soft-tissue]
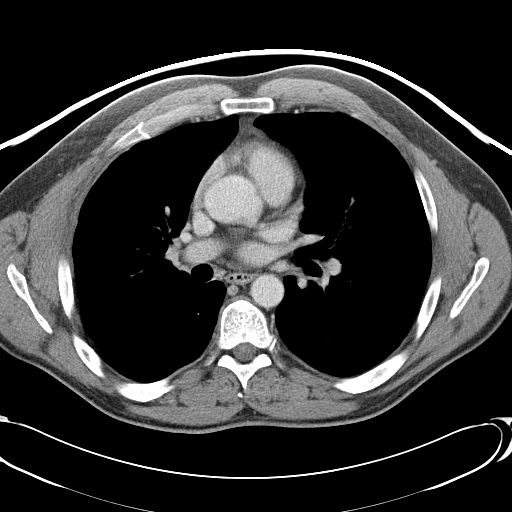
[im 122/138  soft-tissue]
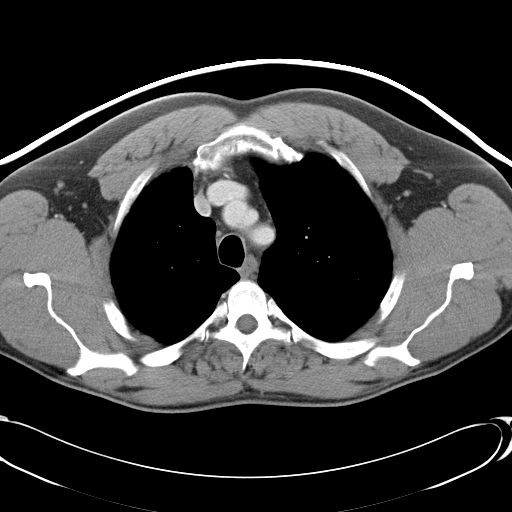
[im 130/138  soft-tissue]
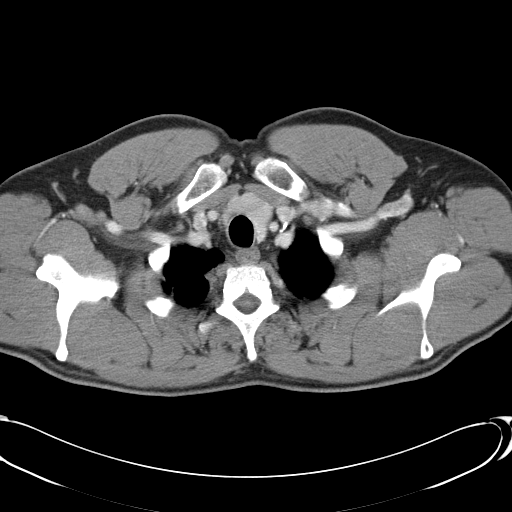

[Series 602: <mpr thick range> · coronal · 1.35mm/px · 3 of 91 slices shown]
[im 31/91  soft-tissue]
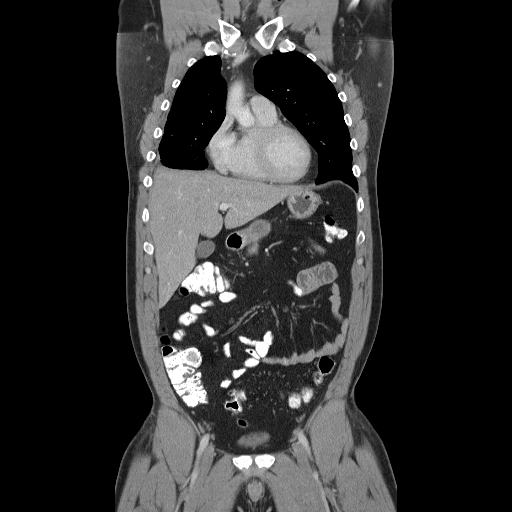
[im 41/91  soft-tissue]
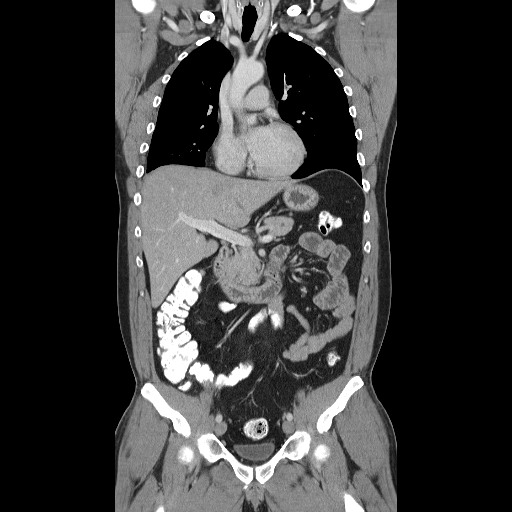
[im 51/91  soft-tissue]
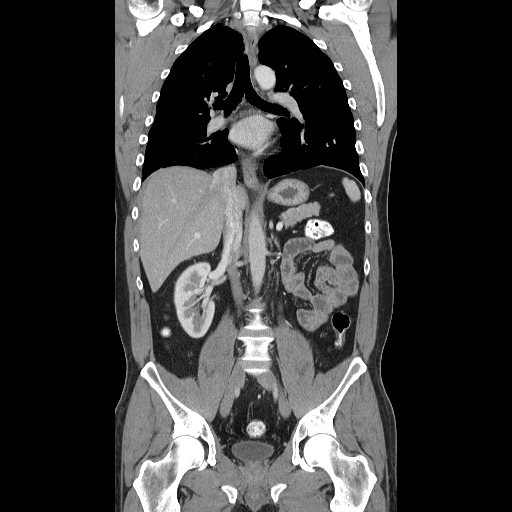

[16 of 46 positions shown; findings below may reference images not displayed]

FINDINGS: No pathologically enlarged thoracic adenopathy is
 identified. Stable scarring at the right lung base noted.

 Paraseptal emphysema noted. A right upper lobe nodule measures
 x 1.0 cm on image 21 of series 5 (stable based on my measurements).
 A smaller right upper lobe lesion measures 0.5 cm in diameter on
 image 19 of series 5 (stable). A nodule at the right lung base
 measures 1.0 x 0.9 cm (by my measurements previously 0.9 x 0.7 cm)
 on image 39 of series 5.

 There is some faintly increased nodularity along the vascular
 structures at the right lung base, for example on image 41 of
 series 5. A nodule along a left lower lobe pulmonary venous branch
 measures 7 mm in short axis dimension on image 42 of series 5,
 essentially stable. A nodule along the left pulmonary arterial
 branch in the left upper lobe measures 6 mm in diameter on image 20
 of series 5 (formerly 5 mm in diameter). There is some faint
 nodularity medially in the right lung apex on image 13 of series 5.

 A 5 x 3 mm nodule in the lingula on image 37 of series 5 is noted,
 and minimally more prominent than on the prior exam.
IMPRESSION: 1. Scattered small pulmonary nodules are predominately
 perivascular in location. Some of these are very minimally
 increased in size compared the prior exam and some are stable.

 CT ABDOMEN AND PELVIS
FINDINGS: Several tiny hepatic hypodensities appear unchanged and
 technically too small to characterize. The spleen appears
 unremarkable.

 The dorsal pancreatic duct is now mildly prominent. This extends
 into the pancreatic head. Based on this observation, and using
 very high contrast windows, the possibility of a 1.2 cm enhancing
 mass in the pancreatic head is raised.

 The adrenal glands and gallbladder appear normal. No pathologic
 retroperitoneal or porta hepatis adenopathy is identified.

 Left nephrectomy noted. A suspected small lymph node along the
 medial side of the nephrectomy bed has a short axis diameter of 6
 mm, stable. The right kidney appears normal.

 The appendix and urinary bladder appear unremarkable. No free
 pelvic fluid identified. Mild sclerosis along the sacroiliac
 joints suggests bilaterally symmetric stable sacroiliitis.
IMPRESSION: 1. Mildly dilated dorsal pancreatic duct, with suspicion for the
 possibility of a 1.2 cm pancreatic head mass. This possible mass
 is a highly subtle finding. Dedicated dynamic pancreatic protocol
 MRI with without contrast is recommended.

## 2012-02-12 ENCOUNTER — Other Ambulatory Visit: Payer: Self-pay | Admitting: *Deleted

## 2012-02-12 DIAGNOSIS — C7800 Secondary malignant neoplasm of unspecified lung: Secondary | ICD-10-CM

## 2012-02-12 DIAGNOSIS — F419 Anxiety disorder, unspecified: Secondary | ICD-10-CM

## 2012-02-12 MED ORDER — ALPRAZOLAM 1 MG PO TABS: ORAL_TABLET | ORAL | Status: DC

## 2012-02-13 ENCOUNTER — Ambulatory Visit
Admission: RE | Admit: 2012-02-13 | Discharge: 2012-02-13 | Disposition: A | Discharge status: Home / Self Care | Payer: PRIVATE HEALTH INSURANCE | Source: Ambulatory Visit | Attending: Oncology | Admitting: Oncology

## 2012-02-13 DIAGNOSIS — C649 Malignant neoplasm of unspecified kidney, except renal pelvis: Secondary | ICD-10-CM

## 2012-02-13 MED ORDER — IOHEXOL 300 MG/ML  SOLN
100.0000 mL | Freq: Once | INTRAMUSCULAR | Status: AC | PRN
  Administered 2012-02-13: 100 mL via INTRAVENOUS

## 2012-02-20 ENCOUNTER — Ambulatory Visit (HOSPITAL_BASED_OUTPATIENT_CLINIC_OR_DEPARTMENT_OTHER): Payer: PRIVATE HEALTH INSURANCE | Admitting: Oncology

## 2012-02-20 ENCOUNTER — Other Ambulatory Visit: Payer: Self-pay | Admitting: *Deleted

## 2012-02-20 ENCOUNTER — Other Ambulatory Visit: Payer: PRIVATE HEALTH INSURANCE | Admitting: Lab

## 2012-02-20 VITALS — BP 138/95 | HR 66 | Temp 96.9°F | Ht 72.5 in | Wt 224.0 lb

## 2012-02-20 DIAGNOSIS — C649 Malignant neoplasm of unspecified kidney, except renal pelvis: Secondary | ICD-10-CM

## 2012-02-20 DIAGNOSIS — R52 Pain, unspecified: Secondary | ICD-10-CM

## 2012-02-20 LAB — COMPREHENSIVE METABOLIC PANEL
ALT: 42 U/L (ref 0–53)
AST: 39 U/L — ABNORMAL HIGH (ref 0–37)
Albumin: 4.2 g/dL (ref 3.5–5.2)
Alkaline Phosphatase: 84 U/L (ref 39–117)
BUN: 13 mg/dL (ref 6–23)
CO2: 21 mEq/L (ref 19–32)
Calcium: 8.8 mg/dL (ref 8.4–10.5)
Chloride: 103 mEq/L (ref 96–112)
Creatinine, Ser: 1.29 mg/dL (ref 0.50–1.35)
Glucose, Bld: 131 mg/dL — ABNORMAL HIGH (ref 70–99)
Potassium: 4.3 mEq/L (ref 3.5–5.3)
Sodium: 138 mEq/L (ref 135–145)
Total Bilirubin: 0.5 mg/dL (ref 0.3–1.2)
Total Protein: 7.1 g/dL (ref 6.0–8.3)

## 2012-02-20 MED ORDER — HYDROMORPHONE HCL 2 MG PO TABS: 2.0000 mg | ORAL_TABLET | ORAL | Status: DC | PRN

## 2012-02-20 NOTE — Progress Notes (Signed)
Hematology and Oncology Follow Up Visit  Keith [name] [id number] October 11, 1966 46 y.o. 02/20/2012 9:28 AM  [name], MD  [name], M.D.  [name], MD  [name], MD    Principle Diagnosis: This is a 46 year old gentleman with stage IV renal cell carcinoma diagnosed in 2009.   Prior Therapy: 1. Status post laparoscopic radical nephrectomy.  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 2. Patient status post thoracotomy done October 2009.  He had a lower lobe nodule, biopsy proven to be metastatic renal cell carcinoma.   3. Patient is status post stereotactic radiotherapy to pulmonary nodules in May of 2010.  Current therapy: He is on Sutent 50 mg 4 weeks on 2 weeks off since November of 2011, has stable disease. His disease include pancreatic involvement of RCC.   Interim History:  Keith [name] presents today for a followup visit.  He has tolerated Sutent very well.  He has grade 1 diarrhea and for the most part has been doing fairly well with that.  His last imaging studies did show a pancreatic lesion that was biopsy proven to be a renal cell metastasist.  He reporting a mild, sharp chest pain which Dilaudid have helped the pain at this point. He also reports some neuropathic components to this pain.  He is not reporting any abdominal pain.  He did report grade 1-2 hand-foot syndrome.  Had not reported any other complications.His mood is much improved on Cymbolta. He was also started on testosterone supplement.    Medications: I have reviewed the patient's current medications. Current outpatient prescriptions:acetaminophen (TYLENOL) 325 MG tablet, Take 650 mg by mouth as needed.  , Disp: , Rfl: ;  ALPRAZolam (XANAX) 1 MG tablet, TAKE ONE TABLET BY MOUTH EVERY EIGHT HOURS PRN., Disp: 60 tablet, Rfl: 0;  AXIRON 30 MG/ACT SOLN, One tablet by mouth once daily , Disp: , Rfl: ;  DULoxetine (CYMBALTA) 30 MG capsule, Take 30 mg by mouth daily.  , Disp: , Rfl:    gabapentin (NEURONTIN) 300 MG capsule, Take 1 capsule (300 mg total) by mouth at bedtime as needed., Disp: 60 capsule, Rfl: 1;  HYDROmorphone (DILAUDID) 2 MG tablet, Take 1 tablet (2 mg total) by mouth every 4 (four) hours as needed. One tablet every 4-6 hours as needed for pain, Disp: 50 tablet, Rfl: 0;  oxycodone (OXY-IR) 5 MG capsule, Take 1 capsule (5 mg total) by mouth every 4 (four) hours as needed., Disp: 20 capsule, Rfl: 0 pseudoephedrine-acetaminophen (TYLENOL SINUS) 30-500 MG TABS, Take 1 tablet by mouth every 4 (four) hours as needed., Disp: , Rfl: ;  SUNItinib (SUTENT) 50 MG capsule, Take 1 capsule (50 mg total) by mouth daily. One tablet by mouth once daily, four weeks on followed by two weeks off, Disp: 24 capsule, Rfl: 0  Allergies:  Allergies  Allergen Reactions  . Ceftriaxone   . Hydrocodone     Past Medical History, Surgical history, Social history, and Family History were reviewed and updated.  Review of Systems: Constitutional:  Negative for fever, chills, night sweats, anorexia, weight loss, pain. Cardiovascular: no chest pain or dyspnea on exertion Respiratory: no cough, shortness of breath, or wheezing Neurological: no TIA or stroke symptoms Dermatological: negative ENT: negative Skin: Negative. Gastrointestinal: no abdominal pain, change in bowel habits, or black or bloody stools Genito-Urinary: no dysuria, trouble voiding, or hematuria Hematological and Lymphatic: negative Breast: negative Musculoskeletal: negative for - joint swelling, muscle pain or pain in foot -  bilateral Remaining ROS negative. Physical Exam: Blood pressure 138/95, pulse 66, temperature 96.9 F (36.1 C), temperature source Oral, height 6' 0.5" (1.842 m), weight 224 lb (101.606 kg). ECOG: 0 General appearance: alert Head: Normocephalic, without obvious abnormality, atraumatic Neck: no adenopathy, no carotid bruit, no JVD, supple, symmetrical, trachea midline and thyroid not enlarged,  symmetric, no tenderness/mass/nodules Lymph nodes: Cervical, supraclavicular, and axillary nodes normal. Heart:regular rate and rhythm, S1, S2 normal, no murmur, click, rub or gallop Lung:chest clear, no wheezing, rales, normal symmetric air entry. Chest wall pain is reproducible with pressure. Abdomin: soft, non-tender, without masses or organomegaly EXT:no edema, no despumation.   Lab Results: Lab Results  Component Value Date   WBC 6.9 01/09/2012   HGB 15.0 01/09/2012   HCT 43.5 01/09/2012   MCV 106.7* 01/09/2012   PLT 133* 01/09/2012     Chemistry      Component Value Date/Time   NA 139 01/09/2012 0851   NA 147* 11/21/2011 0843   K 4.3 01/09/2012 0851   K 4.5 11/21/2011 0843   CL 105 01/09/2012 0851   CL 98 11/21/2011 0843   CO2 24 01/09/2012 0851   CO2 30 11/21/2011 0843   BUN 13 01/09/2012 0851   BUN 13 11/21/2011 0843   CREATININE 1.35 01/09/2012 0851   CREATININE 1.4* 11/21/2011 0843      Component Value Date/Time   CALCIUM 9.0 01/09/2012 0851   CALCIUM 9.1 11/21/2011 0843   ALKPHOS 91 01/09/2012 0851   ALKPHOS 102* 11/21/2011 0843   AST 36 01/09/2012 0851   AST 36 11/21/2011 0843   ALT 51 01/09/2012 0851   BILITOT 0.4 01/09/2012 0851   BILITOT 0.60 11/21/2011 0843     CT ABDOMEN AND PELVIS WITH CONTRAST  Technique: Multidetector CT imaging of the abdomen and pelvis was  performed following the standard protocol during bolus  administration of intravenous contrast.  Contrast: 184mL OMNIPAQUE IOHEXOL 300 MG/ML IV SOLN  Comparison: Multiple priors, most recently CT of chest 12/31/2011,  and CT of chest, abdomen and pelvis dated 11/21/2011.  Findings:  Lung Bases: Image 1 of series 4 again demonstrates a focal soft  tissue with spiculated margins at the bifurcation of medial lateral  segment bronchi to the right middle lobe, with associated pleural  retraction of both the right major and minor fissures. This appears  slightly more prominent than the prior examination,  but is  difficult to measure as it is incompletely visualized and highly  irregular in appearance. High attenuation linear structure in the  right lower lobe is consistent with a suture line likely from prior  wedge resection. Heart size is mildly enlarged.  Abdomen/Pelvis: 6 mm low attenuation lesion between segments 5 and  8 in the liver, unchanged compared to the prior examinations. No  other new focal hepatic lesions are identified. The enhanced  appearance of the gallbladder, spleen, bilateral adrenal glands and  right kidney is unremarkable. Pancreatic ductal dilatation is  similar compared to prior studies, and at least one of the  previously noted hypervascular masses in the pancreas appears  similar (in the mid body best demonstrated on image 27 of series 3,  measuring approximately 1.9 x 1.5 cm).  Normal appendix. No ascites or pneumoperitoneum and no pathologic  distension of bowel. No definite pathologic adenopathy noted  within the abdomen or pelvis. Urinary bladder is unremarkable in  appearance.  Musculoskeletal: There are no aggressive appearing lytic or blastic  lesions noted in the visualized portions of the  skeleton.  IMPRESSION:  1. Appearance of abdomen and pelvis is very similar to recent prior  study from 11/21/2011. At least one of the pancreatic lesions (the  largest lesion within the mid body of the pancreas) appears similar  in size measuring approximately 1.9 x 1.5 cm, again concerning for  pancreatic metastases from the patient's left renal cell carcinoma.  No definite new metastatic disease noted in the abdomen or pelvis.  2. The lung lesion in the right middle lobe is incompletely  visualized, but does appear slightly more prominent than the prior  examination from 12/31/2011. If there is concern for progression  of disease within the thorax, further evaluation with thoracic CT  scan may be warranted.   Impression and Plan:  This is a pleasant  46 year old gentleman with the following issues. 1. Metastatic renal cell carcinoma.  He has documented disease to the lung and the pancreas.  He tolerating Sutent well. He does have low grade hand-foot syndrome. The plan is to continue Sutent with current dose and schedule. His CT of the abdomin and pelvis was reviewed today with the patient which showed stable disease. 2. Pancreatic thickening/mass.  This has been biopsy proven to be a renal cell metastasis. No change in treatment at this time. 3. Thrombocytopenia related to Sutent  no need for any dose reduction or delay. 4. Hematochezia, probably hemorrhoidal bleed.  Not active at this time 5. Depression/Anxiety: mood much improved on Cymbolta. 6. Chest wall pain: unclear etiology. His pain has a neuropathic component, he is on Gabapentin and use PRN dilaudid.  7. Follow up in on 03/26/2012       [facility], MD 3/1/20139:28 AM

## 2012-02-20 NOTE — Progress Notes (Signed)
Patient given a signed refill script for dilaudid. Per [name].

## 2012-02-27 ENCOUNTER — Other Ambulatory Visit: Payer: Self-pay | Admitting: *Deleted

## 2012-02-27 DIAGNOSIS — C7800 Secondary malignant neoplasm of unspecified lung: Secondary | ICD-10-CM

## 2012-02-27 DIAGNOSIS — F419 Anxiety disorder, unspecified: Secondary | ICD-10-CM

## 2012-02-27 MED ORDER — ALPRAZOLAM 1 MG PO TABS: ORAL_TABLET | ORAL | Status: DC

## 2012-03-05 ENCOUNTER — Other Ambulatory Visit: Payer: Self-pay | Admitting: *Deleted

## 2012-03-05 ENCOUNTER — Encounter: Payer: Self-pay | Admitting: *Deleted

## 2012-03-05 DIAGNOSIS — C649 Malignant neoplasm of unspecified kidney, except renal pelvis: Secondary | ICD-10-CM

## 2012-03-05 MED ORDER — SUNITINIB MALATE 50 MG PO CAPS: 50.0000 mg | ORAL_CAPSULE | Freq: Every day | ORAL | Status: DC

## 2012-03-05 NOTE — Progress Notes (Signed)
RECEIVED A FAX FROM [facility] CONCERNING A CONFIRMATION OF FACSIMILE RECEIPT. 

## 2012-03-10 ENCOUNTER — Telehealth: Payer: Self-pay | Admitting: Certified Registered Nurse Anesthetist

## 2012-03-10 NOTE — Telephone Encounter (Signed)
Confirmation of Prescription shipment fax received 03/09/2012. Sutent

## 2012-03-26 ENCOUNTER — Other Ambulatory Visit (HOSPITAL_BASED_OUTPATIENT_CLINIC_OR_DEPARTMENT_OTHER): Payer: PRIVATE HEALTH INSURANCE | Admitting: Lab

## 2012-03-26 ENCOUNTER — Telehealth: Payer: Self-pay | Admitting: Oncology

## 2012-03-26 ENCOUNTER — Ambulatory Visit (HOSPITAL_BASED_OUTPATIENT_CLINIC_OR_DEPARTMENT_OTHER): Payer: PRIVATE HEALTH INSURANCE | Admitting: Oncology

## 2012-03-26 VITALS — BP 139/92 | HR 77 | Temp 97.0°F | Ht 72.5 in | Wt 223.0 lb

## 2012-03-26 DIAGNOSIS — C649 Malignant neoplasm of unspecified kidney, except renal pelvis: Secondary | ICD-10-CM

## 2012-03-26 DIAGNOSIS — C7800 Secondary malignant neoplasm of unspecified lung: Secondary | ICD-10-CM

## 2012-03-26 DIAGNOSIS — C7889 Secondary malignant neoplasm of other digestive organs: Secondary | ICD-10-CM

## 2012-03-26 DIAGNOSIS — R071 Chest pain on breathing: Secondary | ICD-10-CM

## 2012-03-26 DIAGNOSIS — F419 Anxiety disorder, unspecified: Secondary | ICD-10-CM

## 2012-03-26 LAB — COMPREHENSIVE METABOLIC PANEL
ALT: 52 U/L (ref 0–53)
AST: 35 U/L (ref 0–37)
Albumin: 4.3 g/dL (ref 3.5–5.2)
Alkaline Phosphatase: 102 U/L (ref 39–117)
BUN: 15 mg/dL (ref 6–23)
CO2: 26 mEq/L (ref 19–32)
Calcium: 9.2 mg/dL (ref 8.4–10.5)
Chloride: 101 mEq/L (ref 96–112)
Creatinine, Ser: 1.4 mg/dL — ABNORMAL HIGH (ref 0.50–1.35)
Glucose, Bld: 104 mg/dL — ABNORMAL HIGH (ref 70–99)
Potassium: 4.5 mEq/L (ref 3.5–5.3)
Sodium: 137 mEq/L (ref 135–145)
Total Bilirubin: 0.4 mg/dL (ref 0.3–1.2)
Total Protein: 7.5 g/dL (ref 6.0–8.3)

## 2012-03-26 LAB — CBC WITH DIFFERENTIAL/PLATELET
BASO%: 0.8 % (ref 0.0–2.0)
Basophils Absolute: 0.1 10*3/uL (ref 0.0–0.1)
EOS%: 0.8 % (ref 0.0–7.0)
Eosinophils Absolute: 0.1 10*3/uL (ref 0.0–0.5)
HCT: 46.9 % (ref 38.4–49.9)
HGB: 15.9 g/dL (ref 13.0–17.1)
LYMPH%: 21.5 % (ref 14.0–49.0)
MCH: 36.4 pg — ABNORMAL HIGH (ref 27.2–33.4)
MCHC: 33.9 g/dL (ref 32.0–36.0)
MCV: 107.4 fL — ABNORMAL HIGH (ref 79.3–98.0)
MONO#: 0.3 10*3/uL (ref 0.1–0.9)
MONO%: 4.2 % (ref 0.0–14.0)
NEUT#: 4.8 10*3/uL (ref 1.5–6.5)
NEUT%: 72.7 % (ref 39.0–75.0)
Platelets: 191 10*3/uL (ref 140–400)
RBC: 4.37 10*6/uL (ref 4.20–5.82)
RDW: 14.6 % (ref 11.0–14.6)
WBC: 6.6 10*3/uL (ref 4.0–10.3)
lymph#: 1.4 10*3/uL (ref 0.9–3.3)
nRBC: 0 % (ref 0–0)

## 2012-03-26 MED ORDER — PSEUDOEPH-BROMPHEN-DM 30-2-10 MG/5ML PO SYRP: 5.0000 mL | ORAL_SOLUTION | Freq: Four times a day (QID) | ORAL | Status: AC | PRN

## 2012-03-26 MED ORDER — ALPRAZOLAM 1 MG PO TABS: ORAL_TABLET | ORAL | Status: DC

## 2012-03-26 NOTE — Telephone Encounter (Signed)
gv pt appt schedule for may including ct for 5/10.

## 2012-03-26 NOTE — Progress Notes (Signed)
Hematology and Oncology Follow Up Visit  Keith [name] US:[id number] 1966-06-15 46 y.o. 03/26/2012 9:52 AM  Keith Bring, MD  Keith [name], M.D.  Keith Bent, MD  Keith Banister, MD    Principle Diagnosis: This is a 46 year old gentleman with stage IV renal cell carcinoma diagnosed in 2009.   Prior Therapy: 1. Status post laparoscopic radical nephrectomy.  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 2. Patient status post thoracotomy done October 2009.  He had a lower lobe nodule, biopsy proven to be metastatic renal cell carcinoma.   3. Patient is status post stereotactic radiotherapy to pulmonary nodules in May of 2010.  Current therapy: He is on Sutent 50 mg 4 weeks on 2 weeks off since November of 2011, has stable disease. His disease include pancreatic involvement of RCC.   Interim History:  Keith [name] presents today for a followup visit.  He has tolerated Sutent very well.  He has grade 1 to 2 diarrhea and for the most part has been doing fairly well with that.  He reporting a mild, sharp chest pain which Dilaudid have helped the pain at this point. He also reports some neuropathic components to this pain.  He is not reporting any abdominal pain.  He did report grade 1-2 hand-foot syndrome.  Had not reported any other complications. His mood is much improved on Cymbolta.  He is reporting sinus congestion and cough at times without fevers.    Medications: I have reviewed the patient's current medications. Current outpatient prescriptions:acetaminophen (TYLENOL) 325 MG tablet, Take 650 mg by mouth as needed.  , Disp: , Rfl: ;  ALPRAZolam (XANAX) 1 MG tablet, TAKE ONE TABLET BY MOUTH EVERY EIGHT HOURS PRN., Disp: 60 tablet, Rfl: 1;  AXIRON 30 MG/ACT SOLN, One tablet by mouth once daily , Disp: , Rfl: ;  DULoxetine (CYMBALTA) 30 MG capsule, Take 30 mg by mouth daily.  , Disp: , Rfl:  gabapentin (NEURONTIN) 300 MG capsule, Take 1 capsule (300 mg total) by mouth at  bedtime as needed., Disp: 60 capsule, Rfl: 1;  HYDROmorphone (DILAUDID) 2 MG tablet, Take 1 tablet (2 mg total) by mouth every 4 (four) hours as needed. One tablet every 4-6 hours as needed for pain, Disp: 50 tablet, Rfl: 0;  oxycodone (OXY-IR) 5 MG capsule, Take 1 capsule (5 mg total) by mouth every 4 (four) hours as needed., Disp: 20 capsule, Rfl: 0 pseudoephedrine-acetaminophen (TYLENOL SINUS) 30-500 MG TABS, Take 1 tablet by mouth every 4 (four) hours as needed., Disp: , Rfl: ;  SUNItinib (SUTENT) 50 MG capsule, Take 1 capsule (50 mg total) by mouth daily. One tablet by mouth once daily, four weeks on followed by two weeks off, Disp: 24 capsule, Rfl: 0;  DISCONTD: ALPRAZolam (XANAX) 1 MG tablet, TAKE ONE TABLET BY MOUTH EVERY EIGHT HOURS PRN., Disp: 60 tablet, Rfl: 0 brompheniramine-pseudoephedrine-DM 30-2-10 MG/5ML syrup, Take 5 mLs by mouth 4 (four) times daily as needed., Disp: 120 mL, Rfl: 0  Allergies:  Allergies  Allergen Reactions  . Ceftriaxone   . Hydrocodone     Past Medical History, Surgical history, Social history, and Family History were reviewed and updated.  Review of Systems: Constitutional:  Negative for fever, chills, night sweats, anorexia, weight loss, pain. Cardiovascular: no chest pain or dyspnea on exertion Respiratory: no cough, shortness of breath, or wheezing Neurological: no TIA or stroke symptoms Dermatological: negative ENT: negative Skin: Negative. Gastrointestinal: no abdominal pain, change in bowel habits, or black  or bloody stools Genito-Urinary: no dysuria, trouble voiding, or hematuria Hematological and Lymphatic: negative Breast: negative Musculoskeletal: negative for - joint swelling, muscle pain or pain in foot - bilateral Remaining ROS negative. Physical Exam: Blood pressure 139/92, pulse 77, temperature 97 F (36.1 C), temperature source Oral, height 6' 0.5" (1.842 m), weight 223 lb (101.152 kg). ECOG: 0 General appearance: alert Head:  Normocephalic, without obvious abnormality, atraumatic Neck: no adenopathy, no carotid bruit, no JVD, supple, symmetrical, trachea midline and thyroid not enlarged, symmetric, no tenderness/mass/nodules Lymph nodes: Cervical, supraclavicular, and axillary nodes normal. Heart:regular rate and rhythm, S1, S2 normal, no murmur, click, rub or gallop Lung:chest clear, no wheezing, rales, normal symmetric air entry. Chest wall pain is reproducible with pressure. Abdomin: soft, non-tender, without masses or organomegaly EXT:no edema, no despumation.   Lab Results: Lab Results  Component Value Date   WBC 6.6 03/26/2012   HGB 15.9 03/26/2012   HCT 46.9 03/26/2012   MCV 107.4* 03/26/2012   PLT 191 03/26/2012     Chemistry      Component Value Date/Time   NA 138 02/20/2012 0850   NA 147* 11/21/2011 0843   K 4.3 02/20/2012 0850   K 4.5 11/21/2011 0843   CL 103 02/20/2012 0850   CL 98 11/21/2011 0843   CO2 21 02/20/2012 0850   CO2 30 11/21/2011 0843   BUN 13 02/20/2012 0850   BUN 13 11/21/2011 0843   CREATININE 1.29 02/20/2012 0850   CREATININE 1.4* 11/21/2011 0843      Component Value Date/Time   CALCIUM 8.8 02/20/2012 0850   CALCIUM 9.1 11/21/2011 0843   ALKPHOS 84 02/20/2012 0850   ALKPHOS 102* 11/21/2011 0843   AST 39* 02/20/2012 0850   AST 36 11/21/2011 0843   ALT 42 02/20/2012 0850   BILITOT 0.5 02/20/2012 0850   BILITOT 0.60 11/21/2011 0843      Impression and Plan:  This is a pleasant 46 year old gentleman with the following issues. 1. Metastatic renal cell carcinoma.  He has documented disease to the lung and the pancreas.  He tolerating Sutent well. He does have low grade hand-foot syndrome. The plan is to continue Sutent with current dose and schedule. His CT will be scheduled on 5/10 and follow up on 5/17 2. Pancreatic thickening/mass.  This has been biopsy proven to be a renal cell metastasis. No change in treatment at this time. 3. Thrombocytopenia related to Sutent  no need for any dose  reduction or delay. 4. Depression/Anxiety: mood much improved on Cymbolta. 5. Chest wall pain: unclear etiology. His pain has a neuropathic component, he is on Gabapentin and use PRN dilaudid.  6. Follow up in on 5/17.       [facility], MD 4/5/20139:52 AM

## 2012-04-08 ENCOUNTER — Other Ambulatory Visit: Payer: Self-pay | Admitting: Oncology

## 2012-04-30 ENCOUNTER — Other Ambulatory Visit (HOSPITAL_BASED_OUTPATIENT_CLINIC_OR_DEPARTMENT_OTHER): Payer: BC Managed Care – PPO | Admitting: Lab

## 2012-04-30 ENCOUNTER — Ambulatory Visit (HOSPITAL_COMMUNITY)
Admission: RE | Admit: 2012-04-30 | Discharge: 2012-04-30 | Disposition: A | Discharge status: Home / Self Care | Payer: BC Managed Care – PPO | Source: Ambulatory Visit | Attending: Oncology | Admitting: Oncology

## 2012-04-30 DIAGNOSIS — F17200 Nicotine dependence, unspecified, uncomplicated: Secondary | ICD-10-CM

## 2012-04-30 DIAGNOSIS — J984 Other disorders of lung: Secondary | ICD-10-CM | POA: Insufficient documentation

## 2012-04-30 DIAGNOSIS — K869 Disease of pancreas, unspecified: Secondary | ICD-10-CM | POA: Insufficient documentation

## 2012-04-30 DIAGNOSIS — C649 Malignant neoplasm of unspecified kidney, except renal pelvis: Secondary | ICD-10-CM | POA: Insufficient documentation

## 2012-04-30 DIAGNOSIS — K7689 Other specified diseases of liver: Secondary | ICD-10-CM | POA: Insufficient documentation

## 2012-04-30 LAB — CMP (CANCER CENTER ONLY)
ALT(SGPT): 48 U/L — ABNORMAL HIGH (ref 10–47)
AST: 36 U/L (ref 11–38)
Albumin: 3.8 g/dL (ref 3.3–5.5)
Alkaline Phosphatase: 93 U/L — ABNORMAL HIGH (ref 26–84)
BUN, Bld: 14 mg/dL (ref 7–22)
CO2: 29 mEq/L (ref 18–33)
Calcium: 8.5 mg/dL (ref 8.0–10.3)
Chloride: 96 mEq/L — ABNORMAL LOW (ref 98–108)
Creat: 1.4 mg/dl — ABNORMAL HIGH (ref 0.6–1.2)
Glucose, Bld: 98 mg/dL (ref 73–118)
Potassium: 4.3 mEq/L (ref 3.3–4.7)
Sodium: 139 mEq/L (ref 128–145)
Total Bilirubin: 0.7 mg/dl (ref 0.20–1.60)
Total Protein: 7.9 g/dL (ref 6.4–8.1)

## 2012-04-30 LAB — CBC WITH DIFFERENTIAL/PLATELET
BASO%: 0.5 % (ref 0.0–2.0)
Basophils Absolute: 0 10*3/uL (ref 0.0–0.1)
EOS%: 0.9 % (ref 0.0–7.0)
Eosinophils Absolute: 0.1 10*3/uL (ref 0.0–0.5)
HCT: 45 % (ref 38.4–49.9)
HGB: 15.4 g/dL (ref 13.0–17.1)
LYMPH%: 32.7 % (ref 14.0–49.0)
MCH: 36.2 pg — ABNORMAL HIGH (ref 27.2–33.4)
MCHC: 34.1 g/dL (ref 32.0–36.0)
MCV: 106.1 fL — ABNORMAL HIGH (ref 79.3–98.0)
MONO#: 0.4 10*3/uL (ref 0.1–0.9)
MONO%: 5.3 % (ref 0.0–14.0)
NEUT#: 4.4 10*3/uL (ref 1.5–6.5)
NEUT%: 60.6 % (ref 39.0–75.0)
Platelets: 201 10*3/uL (ref 140–400)
RBC: 4.24 10*6/uL (ref 4.20–5.82)
RDW: 14.5 % (ref 11.0–14.6)
WBC: 7.3 10*3/uL (ref 4.0–10.3)
lymph#: 2.4 10*3/uL (ref 0.9–3.3)
nRBC: 0 % (ref 0–0)

## 2012-04-30 MED ORDER — IOHEXOL 300 MG/ML  SOLN
100.0000 mL | Freq: Once | INTRAMUSCULAR | Status: AC | PRN
  Administered 2012-04-30: 100 mL via INTRAVENOUS

## 2012-05-03 ENCOUNTER — Other Ambulatory Visit: Payer: Self-pay

## 2012-05-03 DIAGNOSIS — C649 Malignant neoplasm of unspecified kidney, except renal pelvis: Secondary | ICD-10-CM

## 2012-05-03 MED ORDER — SUNITINIB MALATE 50 MG PO CAPS: ORAL_CAPSULE | ORAL | Status: DC

## 2012-05-03 NOTE — Telephone Encounter (Signed)
RECEIVED A FAX FROM [facility] CONCERNING A CONFIRMATION OF FACSIMILE RECEIPT. 

## 2012-05-05 ENCOUNTER — Other Ambulatory Visit: Payer: Self-pay | Admitting: Oncology

## 2012-05-07 ENCOUNTER — Ambulatory Visit (HOSPITAL_BASED_OUTPATIENT_CLINIC_OR_DEPARTMENT_OTHER): Payer: BC Managed Care – PPO | Admitting: Oncology

## 2012-05-07 ENCOUNTER — Telehealth: Payer: Self-pay | Admitting: Oncology

## 2012-05-07 DIAGNOSIS — F419 Anxiety disorder, unspecified: Secondary | ICD-10-CM

## 2012-05-07 DIAGNOSIS — C7800 Secondary malignant neoplasm of unspecified lung: Secondary | ICD-10-CM

## 2012-05-07 DIAGNOSIS — F411 Generalized anxiety disorder: Secondary | ICD-10-CM

## 2012-05-07 DIAGNOSIS — C649 Malignant neoplasm of unspecified kidney, except renal pelvis: Secondary | ICD-10-CM

## 2012-05-07 DIAGNOSIS — C7839 Secondary malignant neoplasm of other respiratory organs: Secondary | ICD-10-CM

## 2012-05-07 MED ORDER — CLOBETASOL PROPIONATE 0.05 % EX CREA: TOPICAL_CREAM | Freq: Two times a day (BID) | CUTANEOUS | Status: DC

## 2012-05-07 MED ORDER — ALPRAZOLAM 1 MG PO TABS: ORAL_TABLET | ORAL | Status: DC

## 2012-05-07 MED ORDER — OXYCODONE HCL 5 MG PO CAPS: 5.0000 mg | ORAL_CAPSULE | ORAL | Status: DC | PRN

## 2012-05-07 NOTE — Telephone Encounter (Signed)
Gave pt appt calendar to see ML in June 2013 with labs

## 2012-05-07 NOTE — Progress Notes (Signed)
Hematology and Oncology Follow Up Visit  Keith [name] US:[id number] Sep 26, 1966 46 y.o. 05/07/2012 10:16 AM  Keith Bring, MD  Keith [name], M.D.  Keith Bent, MD  Keith Banister, MD    Principle Diagnosis: This is a 46 year old gentleman with stage IV renal cell carcinoma diagnosed in 2009.   Prior Therapy: 1. Status post laparoscopic radical nephrectomy.  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 2. Patient status post thoracotomy done October 2009.  He had a lower lobe nodule, biopsy proven to be metastatic renal cell carcinoma.   3. Patient is status post stereotactic radiotherapy to pulmonary nodules in May of 2010.  Current therapy: He is on Sutent 50 mg 4 weeks on 2 weeks off since November of 2011, has stable disease. His disease include pancreatic involvement with RCC.   Interim History:  Keith [name] presents today for a followup visit.  He has tolerated Sutent very well.  He has grade 1 diarrhea and for the most part has been doing fairly well with that.  He is not reporting any chest pain at this time.He is not reporting any abdominal pain.  He did report grade 1-2 hand-foot syndrome. His pain in the his feet as well as the ankles. Had not reported any other complications.His mood is much improved on Cymbolta.  He is still working full time. He is awakened at night with leg/foot pain.     Medications: I have reviewed the patient's current medications. Current outpatient prescriptions:acetaminophen (TYLENOL) 325 MG tablet, Take 650 mg by mouth as needed.  , Disp: , Rfl: ;  ALPRAZolam (XANAX) 1 MG tablet, TAKE ONE TABLET BY MOUTH EVERY EIGHT HOURS PRN., Disp: 60 tablet, Rfl: 1;  clobetasol cream (TEMOVATE) 0.05 %, Apply topically 2 (two) times daily., Disp: 30 g, Rfl: 0;  CYMBALTA 30 MG capsule, TAKE ONE CAPSULE EVERY DAY, Disp: 60 capsule, Rfl: 1 gabapentin (NEURONTIN) 300 MG capsule, TAKE 1 CAPSULE BY MOUTH AT BEDTIME, Disp: 60 capsule, Rfl: 1;   pseudoephedrine-acetaminophen (TYLENOL SINUS) 30-500 MG TABS, Take 1 tablet by mouth every 4 (four) hours as needed., Disp: , Rfl: ;  SUNItinib (SUTENT) 50 MG capsule, Take 1 capsule by mouth daily for 28 days on, 14 days off., Disp: 28 capsule, Rfl: 0;  AXIRON 30 MG/ACT SOLN, One tablet by mouth once daily , Disp: , Rfl:  DISCONTD: ALPRAZolam (XANAX) 1 MG tablet, TAKE ONE TABLET BY MOUTH EVERY EIGHT HOURS PRN., Disp: 60 tablet, Rfl: 1  Allergies:  Allergies  Allergen Reactions  . Ceftriaxone   . Hydrocodone     Past Medical History, Surgical history, Social history, and Family History were reviewed and updated.  Review of Systems: Constitutional:  Negative for fever, chills, night sweats, anorexia, weight loss, pain. Cardiovascular: no chest pain or dyspnea on exertion Respiratory: no cough, shortness of breath, or wheezing Neurological: no TIA or stroke symptoms Dermatological: negative ENT: negative Skin: Negative. Gastrointestinal: no abdominal pain, change in bowel habits, or black or bloody stools Genito-Urinary: no dysuria, trouble voiding, or hematuria Hematological and Lymphatic: negative Breast: negative Musculoskeletal: negative for - joint swelling, muscle pain or pain in foot - bilateral Remaining ROS negative. Physical Exam: Blood pressure 124/78, pulse 73, temperature 97.4 F (36.3 C), temperature source Oral, height 6' 0.5" (1.842 m), weight 216 lb 11.2 oz (98.294 kg). ECOG: 0 General appearance: alert Head: Normocephalic, without obvious abnormality, atraumatic Neck: no adenopathy, no carotid bruit, no JVD, supple, symmetrical, trachea midline and thyroid not  enlarged, symmetric, no tenderness/mass/nodules Lymph nodes: Cervical, supraclavicular, and axillary nodes normal. Heart:regular rate and rhythm, S1, S2 normal, no murmur, click, rub or gallop Lung:chest clear, no wheezing, rales, normal symmetric air entry. Chest wall pain is reproducible with  pressure. Abdomin: soft, non-tender, without masses or organomegaly EXT:no edema, no despumation. No erythema noted. Tender to touch at the ankle and shin area on both sides.     Lab Results: Lab Results  Component Value Date   WBC 7.3 04/30/2012   HGB 15.4 04/30/2012   HCT 45.0 04/30/2012   MCV 106.1* 04/30/2012   PLT 201 04/30/2012     Chemistry      Component Value Date/Time   NA 139 04/30/2012 1134   NA 137 03/26/2012 0905   K 4.3 04/30/2012 1134   K 4.5 03/26/2012 0905   CL 96* 04/30/2012 1134   CL 101 03/26/2012 0905   CO2 29 04/30/2012 1134   CO2 26 03/26/2012 0905   BUN 14 04/30/2012 1134   BUN 15 03/26/2012 0905   CREATININE 1.4* 04/30/2012 1134   CREATININE 1.40* 03/26/2012 0905      Component Value Date/Time   CALCIUM 8.5 04/30/2012 1134   CALCIUM 9.2 03/26/2012 0905   ALKPHOS 93* 04/30/2012 1134   ALKPHOS 102 03/26/2012 0905   AST 36 04/30/2012 1134   AST 35 03/26/2012 0905   ALT 52 03/26/2012 0905   BILITOT 0.70 04/30/2012 1134   BILITOT 0.4 03/26/2012 0905     CT CHEST, ABDOMEN AND PELVIS WITH CONTRAST  Technique: Multidetector CT imaging of the chest, abdomen and  pelvis was performed following the standard protocol during bolus  administration of intravenous contrast.  Contrast: 161mL OMNIPAQUE IOHEXOL 300 MG/ML SOLN  Comparison: Abdomen pelvis CT on 02/13/2012 and chest CTA on  12/31/2011  CT CHEST  Findings: Pleural - parenchymal scarring seen involving all lobes  of the right lung are stable in appearance. No areas of acute  pulmonary infiltrate are seen. No suspicious pulmonary nodules or  masses are identified. No central endobronchial lesion identified.  There is no evidence of pleural or pericardial effusion. No  evidence of hilar or mediastinal masses. No adenopathy seen  elsewhere within the thorax. No suspicious bone lesions are  identified.  IMPRESSION: Stable right lung scarring. No evidence of metastatic  disease or other acute findings.  CT ABDOMEN AND PELVIS   Findings: Tiny sub-centimeter cyst in the anterior segment right  hepatic lobe is stable. No liver masses are identified. No  evidence of biliary ductal dilatation. Gallbladder is  unremarkable.  A 1.6 x 1.8 cm mass in the pancreatic body is stable. Distal  pancreatic ductal dilatation is again demonstrated. No evidence of  peripancreatic inflammatory changes or lymphadenopathy.  The spleen, adrenal glands, and right kidney are normal in  appearance. Prior left nephrectomy again noted. No evidence of  lymphadenopathy within the abdomen or pelvis.  No other soft tissue masses are identified. No evidence of  inflammatory process or abnormal fluid collections. No evidence of  bowel wall thickening or dilatation. No suspicious bone lesions  are identified.  IMPRESSION:  1. Stable 1.8 cm hypervascular mass in the pancreatic body,  suspicious for metastatic disease.  2. No new or progressive disease identified within the abdomen or  pelvis.     Impression and Plan:  This is a pleasant 46 year old gentleman with the following issues. 1. Metastatic renal cell carcinoma.  He has documented disease to the lung and the pancreas.  He  tolerating Sutent well. He does have low grade hand-foot syndrome. The plan is to continue Sutent with current dose and schedule. His CT showed stable disease and was reviewed with the patient today.  2. Pancreatic thickening/mass.  This has been biopsy proven to be a renal cell metastasis. No change in treatment at this time. 3. Thrombocytopenia related to Sutent  no need for any dose reduction or delay. 4. Depression/Anxiety: mood much improved on Cymbolta. 5. Foot/lower legs  pain: unclear etiology. This is likely related to Sutent. I have given Rx to oxycodone and steroid cream.  6. Follow up in on 6/21.       [facility], MD 5/17/201310:16 AM

## 2012-05-11 IMAGING — CT CT CHEST W/O CM
2 of 4 series · 15 of 36 positions shown, 18 images · non-contrast
Comparison: 03/29/2010

CLINICAL DATA: Metastatic renal cell carcinoma

CT CHEST WITHOUT CONTRAST
TECHNIQUE: Multidetector CT imaging of the chest was performed
following the standard protocol without IV contrast.

[Series 2: chest w/o st · axial · non-contrast · 0.82mm/px · z∈[-327,-47]mm · 12 of 66 slices shown, 15 images]
[im 5/66  mediastinal]
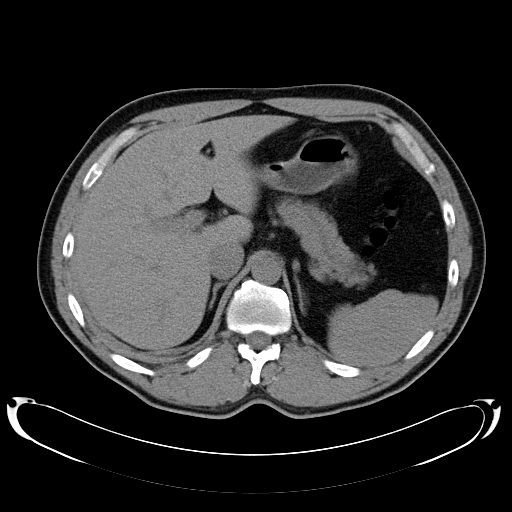
[im 5/66  lung]
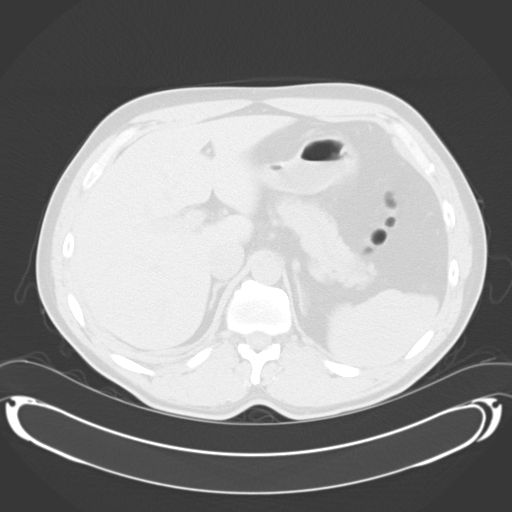
[im 10/66  lung]
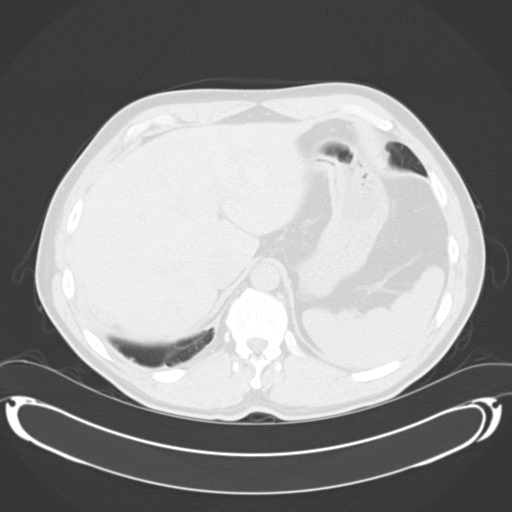
[im 14/66  lung]
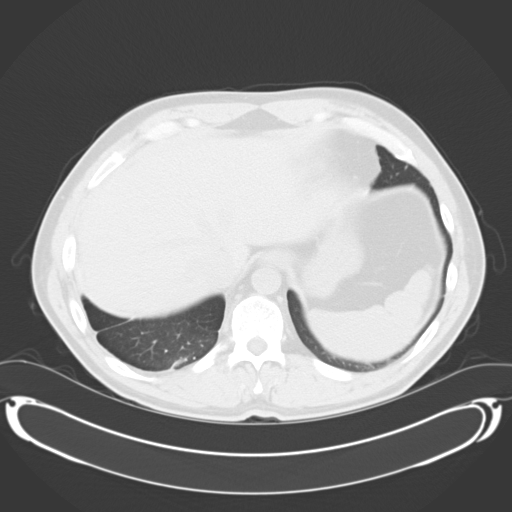
[im 19/66  lung]
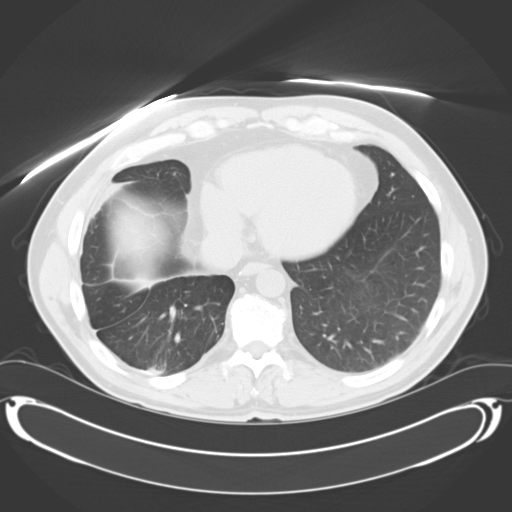
[im 24/66  mediastinal]
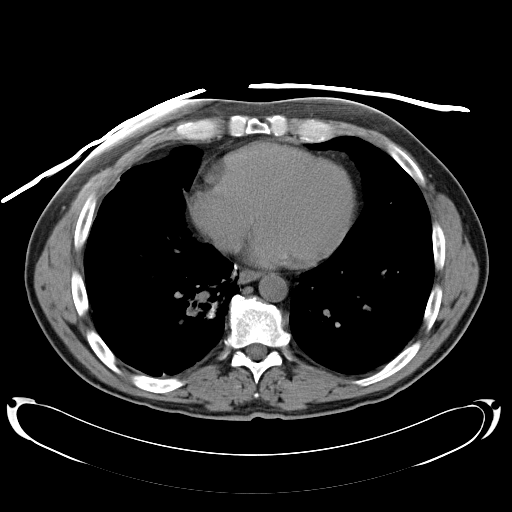
[im 24/66  lung]
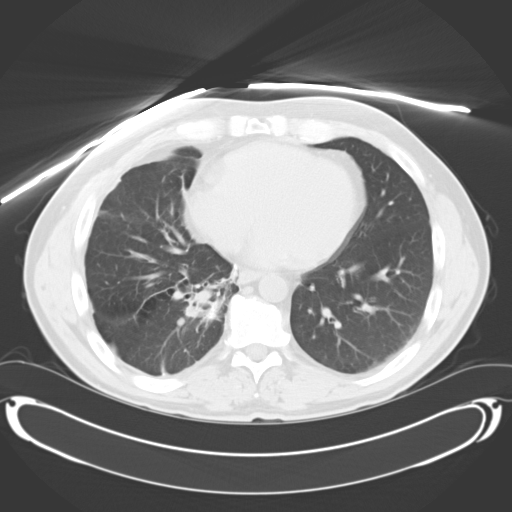
[im 28/66  lung]
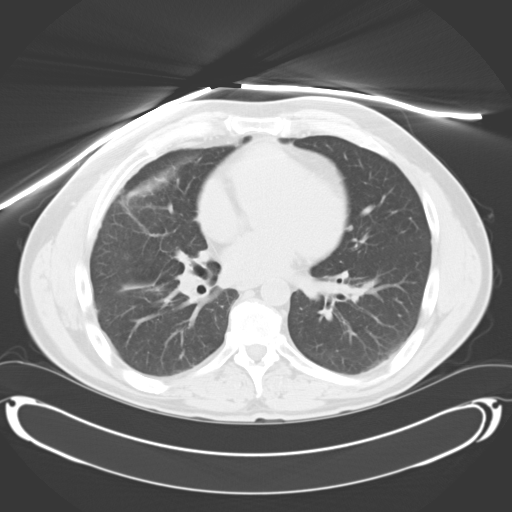
[im 38/66  lung]
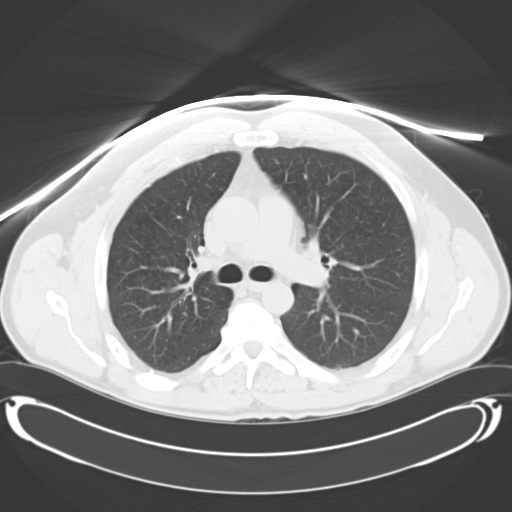
[im 42/66  lung]
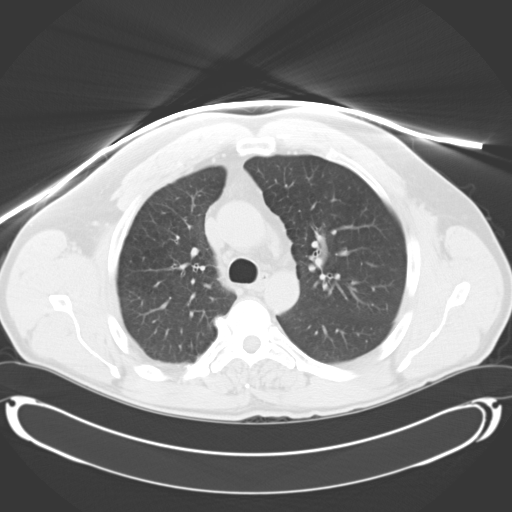
[im 47/66  mediastinal]
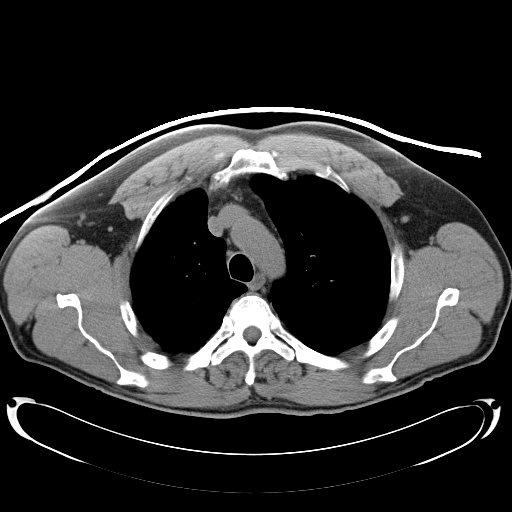
[im 47/66  lung]
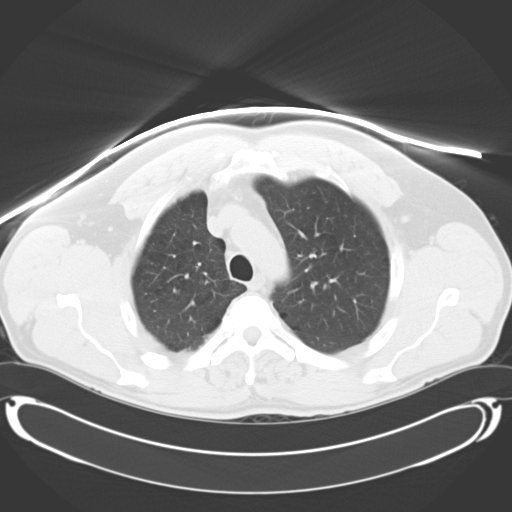
[im 52/66  lung]
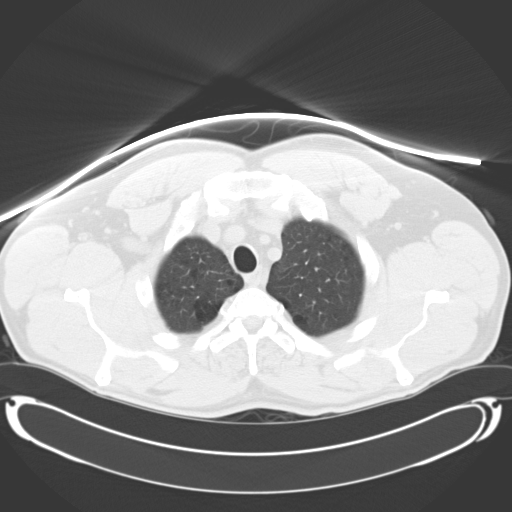
[im 56/66  lung]
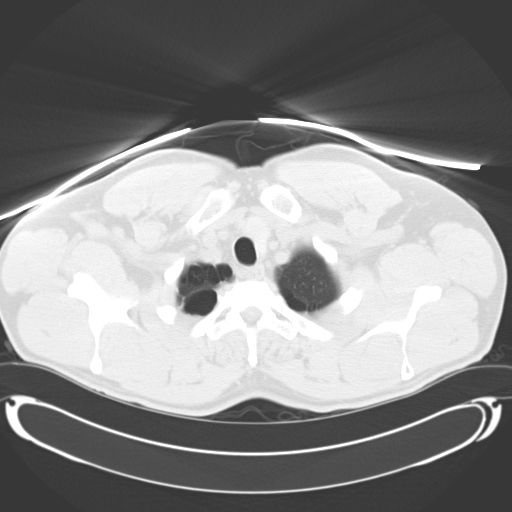
[im 61/66  lung]
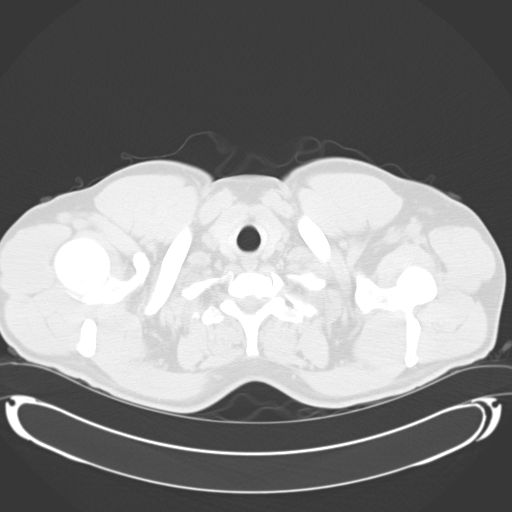

[Series 602: <mpr thick range> · coronal · 0.82mm/px · 3 of 84 slices shown]
[im 17/84  lung]
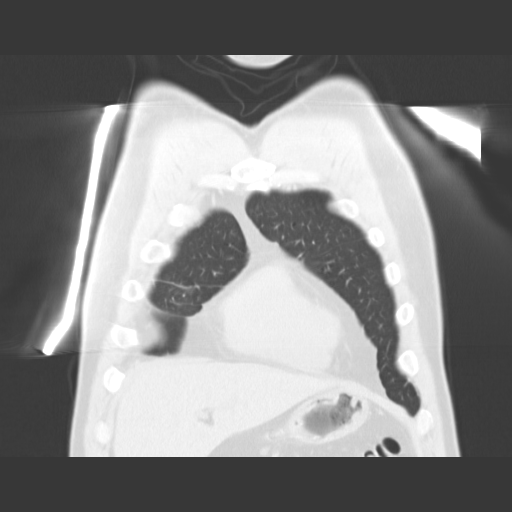
[im 34/84  lung]
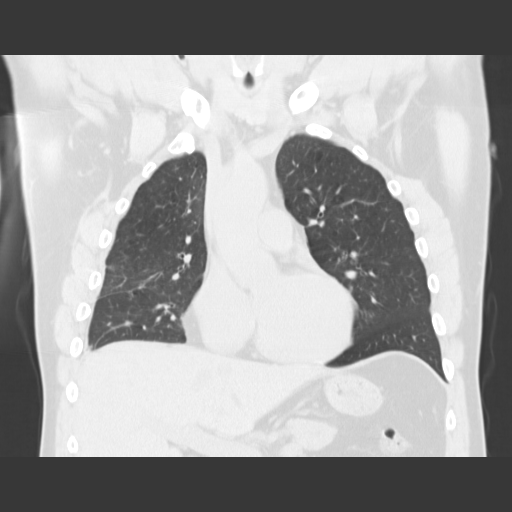
[im 50/84  lung]
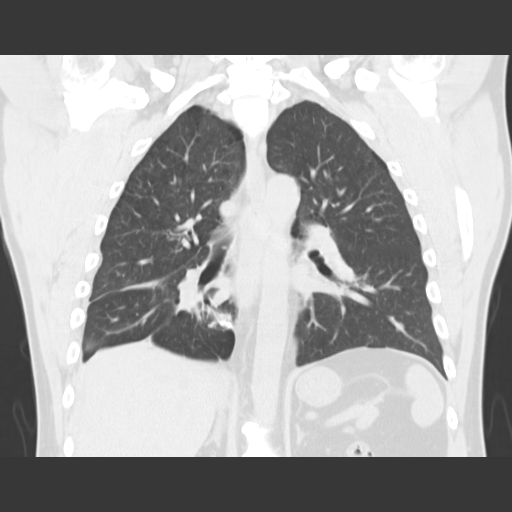

[15 of 36 positions shown; findings below may reference images not displayed]

FINDINGS: No enlarged axillary or supraclavicular lymph nodes.

There are no enlarged mediastinal or hilar lymph nodes.

No pericardial or pleural effusions identified.

Multi focal pulmonary parenchymal nodules are again identified.
Pulmonary nodule within the right upper lobe measures 9.6 mm, image
23.  This is compared with 11.7 mm previously.

Right middle lobe perihilar nodule measures 8.4 mm, image 41.  This
is compared with 10.1 mm previously.

Within the posterior right lung base there is a nodule which
measures 6.5 mm, image 55.  This is compared with 7.6 mm
previously.

No new or enlarging pulmonary parenchymal nodules or masses
identified.

Limited images through the upper abdomen shows no mass.

Review of the visualized osseous structures shows multilevel
spondylosis.

No worrisome bony abnormalities identified.
IMPRESSION: 1.  Previously demonstrated pulmonary nodules demonstrate mild
decrease in size compared with prior exam.
2.  There is no evidence for new or progressive disease.

## 2012-05-24 ENCOUNTER — Telehealth: Payer: Self-pay | Admitting: *Deleted

## 2012-05-24 NOTE — Telephone Encounter (Signed)
[facility] faxed confirmation of prescription shipment.  Sutent was shiipped 05-24-2012 with next business day delivery.

## 2012-06-01 ENCOUNTER — Other Ambulatory Visit: Payer: Self-pay | Admitting: Oncology

## 2012-06-11 ENCOUNTER — Telehealth: Payer: Self-pay | Admitting: Oncology

## 2012-06-11 ENCOUNTER — Ambulatory Visit (HOSPITAL_BASED_OUTPATIENT_CLINIC_OR_DEPARTMENT_OTHER): Payer: BC Managed Care – PPO | Admitting: Oncology

## 2012-06-11 ENCOUNTER — Encounter: Payer: Self-pay | Admitting: Oncology

## 2012-06-11 ENCOUNTER — Other Ambulatory Visit (HOSPITAL_BASED_OUTPATIENT_CLINIC_OR_DEPARTMENT_OTHER): Payer: BC Managed Care – PPO | Admitting: Lab

## 2012-06-11 VITALS — BP 141/95 | HR 64 | Temp 96.8°F | Ht 72.5 in | Wt 212.0 lb

## 2012-06-11 DIAGNOSIS — F419 Anxiety disorder, unspecified: Secondary | ICD-10-CM

## 2012-06-11 DIAGNOSIS — C801 Malignant (primary) neoplasm, unspecified: Secondary | ICD-10-CM

## 2012-06-11 DIAGNOSIS — C649 Malignant neoplasm of unspecified kidney, except renal pelvis: Secondary | ICD-10-CM

## 2012-06-11 DIAGNOSIS — F411 Generalized anxiety disorder: Secondary | ICD-10-CM

## 2012-06-11 DIAGNOSIS — C7800 Secondary malignant neoplasm of unspecified lung: Secondary | ICD-10-CM

## 2012-06-11 DIAGNOSIS — C7839 Secondary malignant neoplasm of other respiratory organs: Secondary | ICD-10-CM

## 2012-06-11 DIAGNOSIS — C7889 Secondary malignant neoplasm of other digestive organs: Secondary | ICD-10-CM

## 2012-06-11 LAB — CBC WITH DIFFERENTIAL/PLATELET
BASO%: 0.4 % (ref 0.0–2.0)
Basophils Absolute: 0 10*3/uL (ref 0.0–0.1)
EOS%: 1.7 % (ref 0.0–7.0)
Eosinophils Absolute: 0.1 10*3/uL (ref 0.0–0.5)
HCT: 48.2 % (ref 38.4–49.9)
HGB: 16.4 g/dL (ref 13.0–17.1)
LYMPH%: 24.7 % (ref 14.0–49.0)
MCH: 36.1 pg — ABNORMAL HIGH (ref 27.2–33.4)
MCHC: 34 g/dL (ref 32.0–36.0)
MCV: 106.4 fL — ABNORMAL HIGH (ref 79.3–98.0)
MONO#: 0.5 10*3/uL (ref 0.1–0.9)
MONO%: 7 % (ref 0.0–14.0)
NEUT#: 4.3 10*3/uL (ref 1.5–6.5)
NEUT%: 66.2 % (ref 39.0–75.0)
Platelets: 155 10*3/uL (ref 140–400)
RBC: 4.53 10*6/uL (ref 4.20–5.82)
RDW: 14.9 % — ABNORMAL HIGH (ref 11.0–14.6)
WBC: 6.6 10*3/uL (ref 4.0–10.3)
lymph#: 1.6 10*3/uL (ref 0.9–3.3)

## 2012-06-11 LAB — COMPREHENSIVE METABOLIC PANEL
ALT: 48 U/L (ref 0–53)
AST: 32 U/L (ref 0–37)
Albumin: 4.5 g/dL (ref 3.5–5.2)
Alkaline Phosphatase: 90 U/L (ref 39–117)
BUN: 17 mg/dL (ref 6–23)
CO2: 26 mEq/L (ref 19–32)
Calcium: 9.5 mg/dL (ref 8.4–10.5)
Chloride: 103 mEq/L (ref 96–112)
Creatinine, Ser: 1.51 mg/dL — ABNORMAL HIGH (ref 0.50–1.35)
Glucose, Bld: 73 mg/dL (ref 70–99)
Potassium: 4.5 mEq/L (ref 3.5–5.3)
Sodium: 138 mEq/L (ref 135–145)
Total Bilirubin: 0.3 mg/dL (ref 0.3–1.2)
Total Protein: 7.5 g/dL (ref 6.0–8.3)

## 2012-06-11 MED ORDER — ALPRAZOLAM 1 MG PO TABS: ORAL_TABLET | ORAL | Status: DC

## 2012-06-11 MED ORDER — OXYCODONE HCL 5 MG PO CAPS: 5.0000 mg | ORAL_CAPSULE | ORAL | Status: DC | PRN

## 2012-06-11 NOTE — Telephone Encounter (Signed)
Gave pt appt for July  2013 lab and MD 

## 2012-06-11 NOTE — Progress Notes (Signed)
Hematology and Oncology Follow Up Visit  JEFRE PAM YE:[id number] January 22, 1966 46 y.o. 06/11/2012 11:33 AM  [name], MD  [name], M.D.  [name], MD  [name], MD    Principle Diagnosis: This is a 46 year old gentleman with stage IV renal cell carcinoma diagnosed in 2009.  Prior Therapy: 1. Status post laparoscopic radical nephrectomy.  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 2. Patient status post thoracotomy done October 2009.  He had a lower lobe nodule, biopsy proven to be metastatic renal cell carcinoma.   3. Patient is status post stereotactic radiotherapy to pulmonary nodules in May of 2010.  Current therapy: He is on Sutent 50 mg 4 weeks on 2 weeks off since November of 2011, has stable disease. His disease include pancreatic involvement with RCC.  Interim History:  Mr. Hogenson presents today for a followup visit.  He has tolerated Sutent very well.  He has grade 1 diarrhea and for the most part has been doing fairly well with that.  He is not reporting any chest pain at this time.He is not reporting any abdominal pain.  He did report grade 1-2 hand-foot syndrome. His pain in the his feet as well as the ankles. Had not reported any other complications.His mood is much improved on Cymbalta.  He is still working full time. He is awakened at night with leg/foot pain. Taking Neurontin for this.  Medications: I have reviewed the patient's current medications. Current outpatient prescriptions:acetaminophen (TYLENOL) 325 MG tablet, Take 650 mg by mouth as needed.  , Disp: , Rfl: ;  ALPRAZolam (XANAX) 1 MG tablet, TAKE ONE TABLET BY MOUTH EVERY EIGHT HOURS PRN., Disp: 60 tablet, Rfl: 1;  AXIRON 30 MG/ACT SOLN, One tablet by mouth once daily , Disp: , Rfl: ;  clobetasol cream (TEMOVATE) 0.05 %, Apply topically 2 (two) times daily., Disp: 30 g, Rfl: 0 CYMBALTA 30 MG capsule, TAKE ONE CAPSULE EVERY DAY, Disp: 60 capsule, Rfl: 1;  gabapentin  (NEURONTIN) 300 MG capsule, TAKE 1 CAPSULE BY MOUTH AT BEDTIME, Disp: 60 capsule, Rfl: 1;  oxycodone (OXY-IR) 5 MG capsule, Take 1 capsule (5 mg total) by mouth every 4 (four) hours as needed., Disp: 50 capsule, Rfl: 0;  pseudoephedrine-acetaminophen (TYLENOL SINUS) 30-500 MG TABS, Take 1 tablet by mouth every 4 (four) hours as needed., Disp: , Rfl:  SUNItinib (SUTENT) 50 MG capsule, Take 1 capsule by mouth daily for 28 days on, 14 days off., Disp: 28 capsule, Rfl: 0;  DISCONTD: ALPRAZolam (XANAX) 1 MG tablet, TAKE ONE TABLET BY MOUTH EVERY EIGHT HOURS PRN., Disp: 60 tablet, Rfl: 1  Allergies:  Allergies  Allergen Reactions  . Ceftriaxone   . Hydrocodone     Past Medical History, Surgical history, Social history, and Family History were reviewed and updated.  Review of Systems: Constitutional:  Negative for fever, chills, night sweats, anorexia, weight loss, pain. Cardiovascular: no chest pain or dyspnea on exertion Respiratory: no cough, shortness of breath, or wheezing Neurological: no TIA or stroke symptoms Dermatological: negative ENT: negative Skin: Negative. Gastrointestinal: no abdominal pain, change in bowel habits, or black or bloody stools Genito-Urinary: no dysuria, trouble voiding, or hematuria Hematological and Lymphatic: negative Breast: negative Musculoskeletal: negative for - joint swelling, muscle pain or pain in foot - bilateral Remaining ROS negative.  Physical Exam: Blood pressure 141/95, pulse 64, temperature 96.8 F (36 C), temperature source Oral, height 6' 0.5" (1.842 m), weight 212 lb (96.163 kg). ECOG: 0 General  appearance: alert Head: Normocephalic, without obvious abnormality, atraumatic Neck: no adenopathy, no carotid bruit, no JVD, supple, symmetrical, trachea midline and thyroid not enlarged, symmetric, no tenderness/mass/nodules Lymph nodes: Cervical, supraclavicular, and axillary nodes normal. Heart:regular rate and rhythm, S1, S2 normal, no murmur,  click, rub or gallop Lung:chest clear, no wheezing, rales, normal symmetric air entry. Chest wall pain is reproducible with pressure. Abdomen: soft, non-tender, without masses or organomegaly EXT:no edema, no despumation. No erythema noted. Tender to touch at the ankle and shin area on both sides.    Lab Results: Lab Results  Component Value Date   WBC 6.6 06/11/2012   HGB 16.4 06/11/2012   HCT 48.2 06/11/2012   MCV 106.4* 06/11/2012   PLT 155 06/11/2012     Chemistry      Component Value Date/Time   NA 139 04/30/2012 1134   NA 137 03/26/2012 0905   K 4.3 04/30/2012 1134   K 4.5 03/26/2012 0905   CL 96* 04/30/2012 1134   CL 101 03/26/2012 0905   CO2 29 04/30/2012 1134   CO2 26 03/26/2012 0905   BUN 14 04/30/2012 1134   BUN 15 03/26/2012 0905   CREATININE 1.4* 04/30/2012 1134   CREATININE 1.40* 03/26/2012 0905      Component Value Date/Time   CALCIUM 8.5 04/30/2012 1134   CALCIUM 9.2 03/26/2012 0905   ALKPHOS 93* 04/30/2012 1134   ALKPHOS 102 03/26/2012 0905   AST 36 04/30/2012 1134   AST 35 03/26/2012 0905   ALT 52 03/26/2012 0905   BILITOT 0.70 04/30/2012 1134   BILITOT 0.4 03/26/2012 0905     Impression and Plan:  This is a pleasant 46 year old gentleman with the following issues. 1. Metastatic renal cell carcinoma.  He has documented disease to the lung and the pancreas.  He tolerating Sutent well. He does have low grade hand-foot syndrome. The plan is to continue Sutent with current dose and schedule. CT scan from May 2013 showed stable disease. 2. Pancreatic thickening/mass.  This has been biopsy proven to be a renal cell metastasis. No change in treatment at this time. 3. Depression/Anxiety: mood much improved on Cymbalta and Xanax. Xanax refilled today. 4. Foot/lower legs  pain: unclear etiology. This is likely related to Sutent. I have refilled his Oxycodone today. He is using moisturizer and Clobetasol cream to his feet. 5. Follow up: In about 1 month.  [place], [place] 6/21/201311:33 AM

## 2012-07-15 ENCOUNTER — Encounter: Payer: Self-pay | Admitting: *Deleted

## 2012-07-15 NOTE — Progress Notes (Signed)
RECEIVED A FAX FROM [facility] CONCERNING A CONFIRMATION OF PRESCRIPTION SHIPMENT FOR SUTENT ON 07/14/12.

## 2012-07-16 ENCOUNTER — Telehealth: Payer: Self-pay | Admitting: Oncology

## 2012-07-16 ENCOUNTER — Encounter: Payer: Self-pay | Admitting: Oncology

## 2012-07-16 ENCOUNTER — Other Ambulatory Visit (HOSPITAL_BASED_OUTPATIENT_CLINIC_OR_DEPARTMENT_OTHER): Payer: BC Managed Care – PPO | Admitting: Lab

## 2012-07-16 ENCOUNTER — Ambulatory Visit (HOSPITAL_BASED_OUTPATIENT_CLINIC_OR_DEPARTMENT_OTHER): Payer: BC Managed Care – PPO | Admitting: Oncology

## 2012-07-16 VITALS — BP 125/72 | HR 63 | Temp 96.7°F | Wt 216.4 lb

## 2012-07-16 DIAGNOSIS — F419 Anxiety disorder, unspecified: Secondary | ICD-10-CM

## 2012-07-16 DIAGNOSIS — C649 Malignant neoplasm of unspecified kidney, except renal pelvis: Secondary | ICD-10-CM

## 2012-07-16 DIAGNOSIS — C7800 Secondary malignant neoplasm of unspecified lung: Secondary | ICD-10-CM

## 2012-07-16 DIAGNOSIS — C7889 Secondary malignant neoplasm of other digestive organs: Secondary | ICD-10-CM

## 2012-07-16 DIAGNOSIS — F411 Generalized anxiety disorder: Secondary | ICD-10-CM

## 2012-07-16 LAB — CBC WITH DIFFERENTIAL/PLATELET
BASO%: 0.4 % (ref 0.0–2.0)
Basophils Absolute: 0 10*3/uL (ref 0.0–0.1)
EOS%: 2.3 % (ref 0.0–7.0)
Eosinophils Absolute: 0.1 10*3/uL (ref 0.0–0.5)
HCT: 42.1 % (ref 38.4–49.9)
HGB: 14.3 g/dL (ref 13.0–17.1)
LYMPH%: 33 % (ref 14.0–49.0)
MCH: 36.5 pg — ABNORMAL HIGH (ref 27.2–33.4)
MCHC: 33.9 g/dL (ref 32.0–36.0)
MCV: 107.6 fL — ABNORMAL HIGH (ref 79.3–98.0)
MONO#: 0.4 10*3/uL (ref 0.1–0.9)
MONO%: 9.9 % (ref 0.0–14.0)
NEUT#: 2.4 10*3/uL (ref 1.5–6.5)
NEUT%: 54.4 % (ref 39.0–75.0)
Platelets: 163 10*3/uL (ref 140–400)
RBC: 3.91 10*6/uL — ABNORMAL LOW (ref 4.20–5.82)
RDW: 15.4 % — ABNORMAL HIGH (ref 11.0–14.6)
WBC: 4.5 10*3/uL (ref 4.0–10.3)
lymph#: 1.5 10*3/uL (ref 0.9–3.3)

## 2012-07-16 LAB — COMPREHENSIVE METABOLIC PANEL
ALT: 34 U/L (ref 0–53)
AST: 21 U/L (ref 0–37)
Albumin: 4.3 g/dL (ref 3.5–5.2)
Alkaline Phosphatase: 77 U/L (ref 39–117)
BUN: 12 mg/dL (ref 6–23)
CO2: 30 mEq/L (ref 19–32)
Calcium: 9.4 mg/dL (ref 8.4–10.5)
Chloride: 106 mEq/L (ref 96–112)
Creatinine, Ser: 1.15 mg/dL (ref 0.50–1.35)
Glucose, Bld: 97 mg/dL (ref 70–99)
Potassium: 5.1 mEq/L (ref 3.5–5.3)
Sodium: 138 mEq/L (ref 135–145)
Total Bilirubin: 0.4 mg/dL (ref 0.3–1.2)
Total Protein: 7.1 g/dL (ref 6.0–8.3)

## 2012-07-16 MED ORDER — OXYCODONE HCL 5 MG PO CAPS: 5.0000 mg | ORAL_CAPSULE | ORAL | Status: DC | PRN

## 2012-07-16 NOTE — Progress Notes (Signed)
Hematology and Oncology Follow Up Visit  Keith [name] US:[id number] 10-07-1966 46 y.o. 07/16/2012 12:50 PM  [name], MD  [name], M.D.  [name], MD  [name], MD    Principle Diagnosis: This is a 46 year old gentleman with stage IV renal cell carcinoma diagnosed in 2009.  Prior Therapy: 1. Status post laparoscopic radical nephrectomy.  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 2. Patient status post thoracotomy done October 2009.  He had a lower lobe nodule, biopsy proven to be metastatic renal cell carcinoma.   3. Patient is status post stereotactic radiotherapy to pulmonary nodules in May of 2010.  Current therapy: He is on Sutent 50 mg 4 weeks on 2 weeks off since November of 2011, has stable disease. His disease include pancreatic involvement with RCC.  Interim History:  Keith [name] presents today for a followup visit.  He has tolerated Sutent very well.  He has grade 1 diarrhea and for the most part has been doing fairly well with that.  He is not reporting any chest pain at this time.He is not reporting any abdominal pain.  He did report grade 1-2 hand-foot syndrome. His pain in the his feet as well as the ankles. Had not reported any other complications.His mood is much improved on Cymbalta. He is still working full time. He is awakened at night with leg/foot pain. He is on Neurontin and use Oxycodone once a day for this. States Oxycodone is not really helping. Pain is exacerbated by wearing steel-toed boots at work.  Medications: I have reviewed the patient's current medications. Current outpatient prescriptions:acetaminophen (TYLENOL) 325 MG tablet, Take 650 mg by mouth as needed.  , Disp: , Rfl: ;  ALPRAZolam (XANAX) 1 MG tablet, TAKE ONE TABLET BY MOUTH EVERY EIGHT HOURS PRN., Disp: 60 tablet, Rfl: 1;  AXIRON 30 MG/ACT SOLN, One tablet by mouth once daily , Disp: , Rfl: ;  clobetasol cream (TEMOVATE) 0.05 %, Apply topically 2 (two) times  daily., Disp: 30 g, Rfl: 0 CYMBALTA 30 MG capsule, TAKE ONE CAPSULE EVERY DAY, Disp: 60 capsule, Rfl: 1;  gabapentin (NEURONTIN) 300 MG capsule, TAKE 1 CAPSULE BY MOUTH AT BEDTIME, Disp: 60 capsule, Rfl: 1;  oxycodone (OXY-IR) 5 MG capsule, Take 1-2 capsules (5-10 mg total) by mouth every 4 (four) hours as needed., Disp: 90 capsule, Rfl: 0;  pseudoephedrine-acetaminophen (TYLENOL SINUS) 30-500 MG TABS, Take 1 tablet by mouth every 4 (four) hours as needed., Disp: , Rfl:  SUNItinib (SUTENT) 50 MG capsule, Take 1 capsule by mouth daily for 28 days on, 14 days off., Disp: 28 capsule, Rfl: 0  Allergies:  Allergies  Allergen Reactions  . Ceftriaxone   . Hydrocodone     Past Medical History, Surgical history, Social history, and Family History were reviewed and updated.  Review of Systems: Constitutional:  Negative for fever, chills, night sweats, anorexia, weight loss, pain. Cardiovascular: no chest pain or dyspnea on exertion Respiratory: no cough, shortness of breath, or wheezing Neurological: no TIA or stroke symptoms Dermatological: negative ENT: negative Skin: Negative. Gastrointestinal: no abdominal pain, change in bowel habits, or black or bloody stools Genito-Urinary: no dysuria, trouble voiding, or hematuria Hematological and Lymphatic: negative Breast: negative Musculoskeletal: negative for - joint swelling, muscle pain or pain in foot - bilateral Remaining ROS negative.  Physical Exam: Blood pressure 125/72, pulse 63, temperature 96.7 F (35.9 C), temperature source Oral, weight 216 lb 6 oz (98.147 kg). ECOG: 0 General appearance: alert  Head: Normocephalic, without obvious abnormality, atraumatic Neck: no adenopathy, no carotid bruit, no JVD, supple, symmetrical, trachea midline and thyroid not enlarged, symmetric, no tenderness/mass/nodules Lymph nodes: Cervical, supraclavicular, and axillary nodes normal. Heart:regular rate and rhythm, S1, S2 normal, no murmur, click, rub  or gallop Lung:chest clear, no wheezing, rales, normal symmetric air entry. Chest wall pain is reproducible with pressure. Abdomen: soft, non-tender, without masses or organomegaly EXT:no edema, no despumation. No erythema noted. Tender to touch at the ankle and shin area on both sides.    Lab Results: Lab Results  Component Value Date   WBC 4.5 07/16/2012   HGB 14.3 07/16/2012   HCT 42.1 07/16/2012   MCV 107.6* 07/16/2012   PLT 163 07/16/2012     Chemistry      Component Value Date/Time   NA 138 06/11/2012 0833   NA 139 04/30/2012 1134   K 4.5 06/11/2012 0833   K 4.3 04/30/2012 1134   CL 103 06/11/2012 0833   CL 96* 04/30/2012 1134   CO2 26 06/11/2012 0833   CO2 29 04/30/2012 1134   BUN 17 06/11/2012 0833   BUN 14 04/30/2012 1134   CREATININE 1.51* 06/11/2012 0833   CREATININE 1.4* 04/30/2012 1134      Component Value Date/Time   CALCIUM 9.5 06/11/2012 0833   CALCIUM 8.5 04/30/2012 1134   ALKPHOS 90 06/11/2012 0833   ALKPHOS 93* 04/30/2012 1134   AST 32 06/11/2012 0833   AST 36 04/30/2012 1134   ALT 48 06/11/2012 0833   BILITOT 0.3 06/11/2012 0833   BILITOT 0.70 04/30/2012 1134     Impression and Plan:  This is a pleasant 46 year old gentleman with the following issues. 1. Metastatic renal cell carcinoma.  He has documented disease to the lung and the pancreas.  He tolerating Sutent well. He does have low grade hand-foot syndrome. The plan is to continue Sutent with current dose and schedule. CT scan from May 2013 showed stable disease. Plan to restage with a CT in early September. 2. Pancreatic thickening/mass.  This has been biopsy proven to be a renal cell metastasis. No change in treatment at this time. 3. Depression/Anxiety: mood much improved on Cymbalta and Xanax.  4. Foot/lower legs  pain: unclear etiology. This is likely related to Sutent. I have refilled his Oxycodone today. He may take 1-2 tablets every 4 hours as needed for his pain. Note for work written so that he does not have  to wear steel-toed shoes.  5. Follow up: On 9/13.  Mikey Bussing 7/26/201312:50 PM

## 2012-07-16 NOTE — Telephone Encounter (Signed)
Gave pt appt for September 2013 lab and Ct then see MD after a few days, NPO 4 hours , gave pt oral contrast

## 2012-08-17 IMAGING — CT CT CHEST W/O CM
1 of 3 series · 12 of 31 positions shown, 15 images · non-contrast
Comparison: 07/08/2010

CLINICAL DATA: The follow-up metastatic renal cell carcinoma.
Recently completed radiation therapy.  Cough.

CT CHEST WITHOUT CONTRAST
TECHNIQUE: Multidetector CT imaging of the chest was performed
following the standard protocol without IV contrast.

[Series 2: rtn chest without st · axial · non-contrast · 0.80mm/px · z∈[-326,-76]mm · 12 of 61 slices shown, 15 images]
[im 6/61  mediastinal]
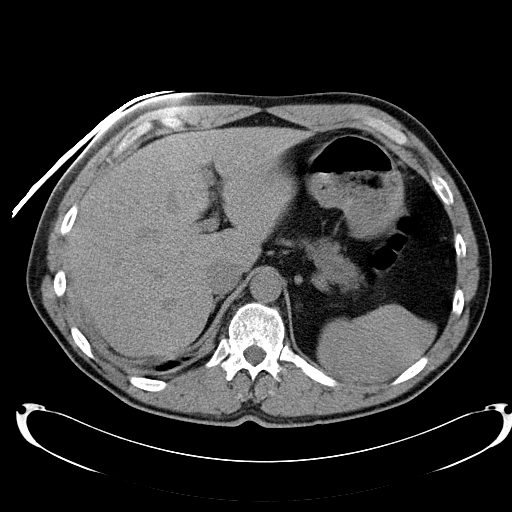
[im 6/61  lung]
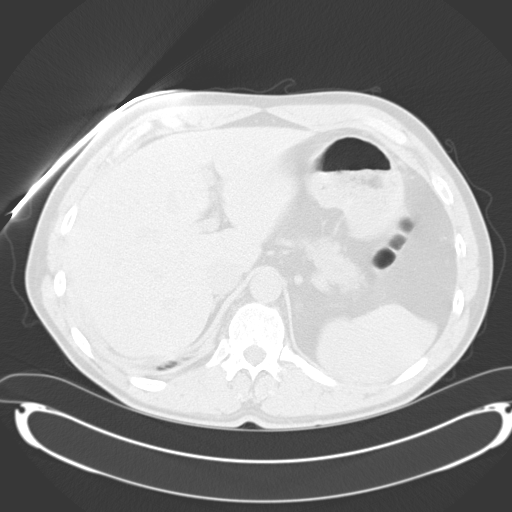
[im 11/61  lung]
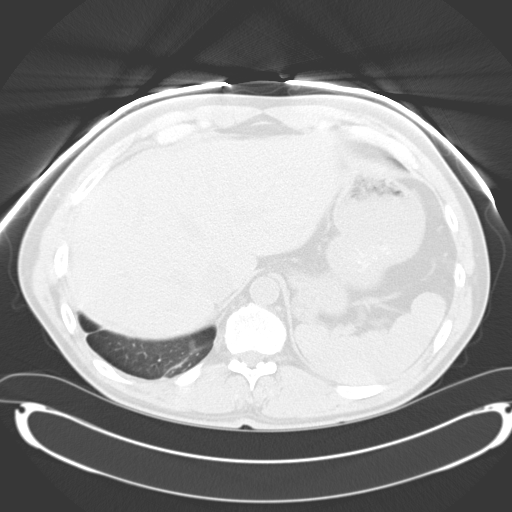
[im 16/61  lung]
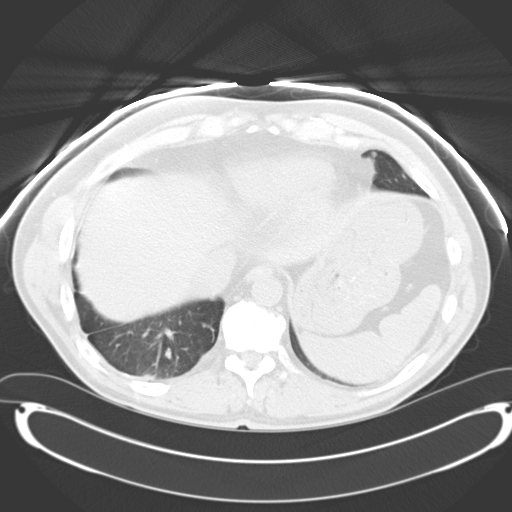
[im 21/61  lung]
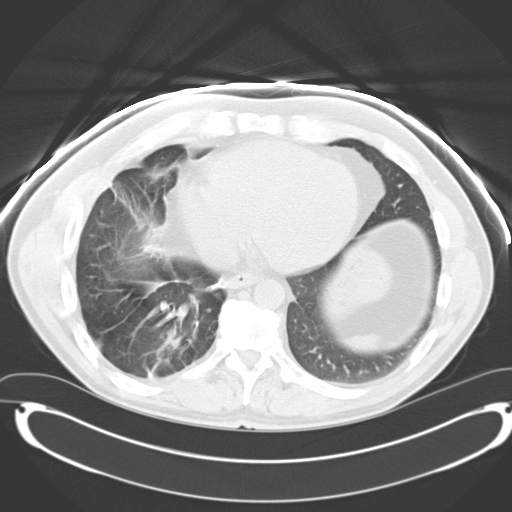
[im 24/61  mediastinal]
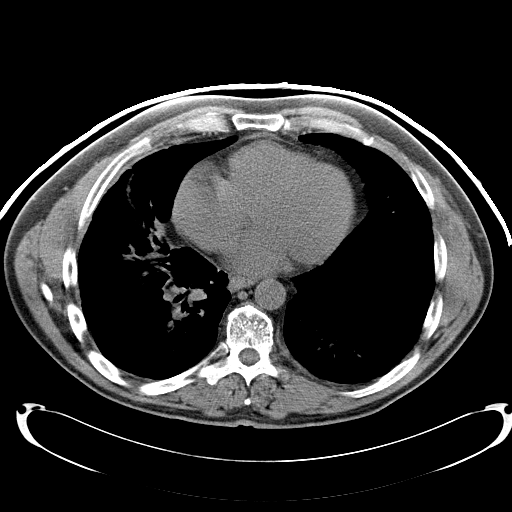
[im 24/61  lung]
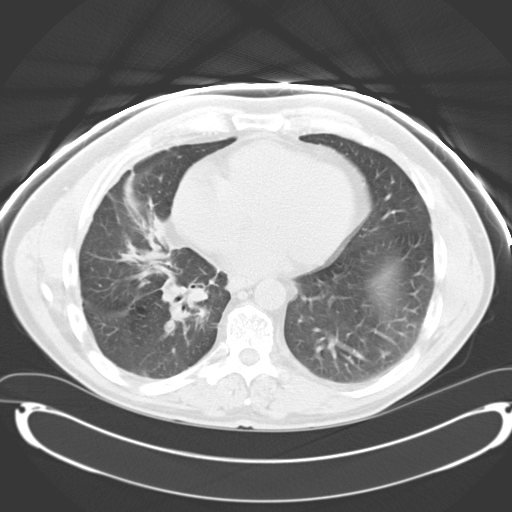
[im 26/61  lung]
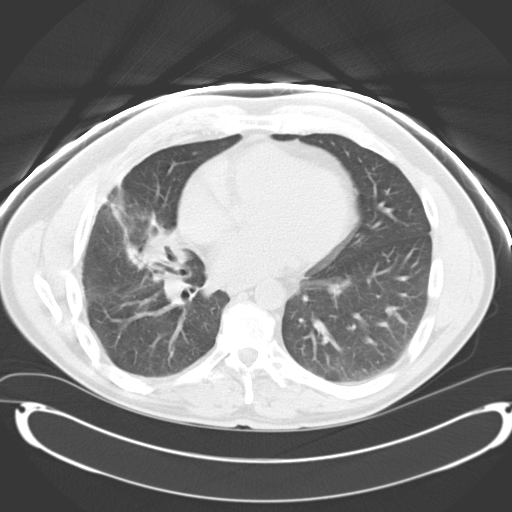
[im 31/61  lung]
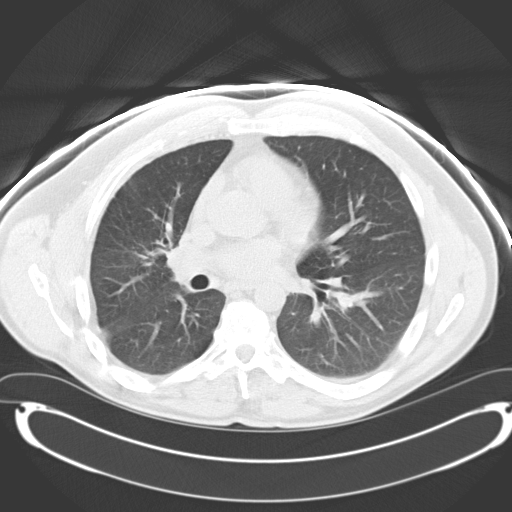
[im 36/61  lung]
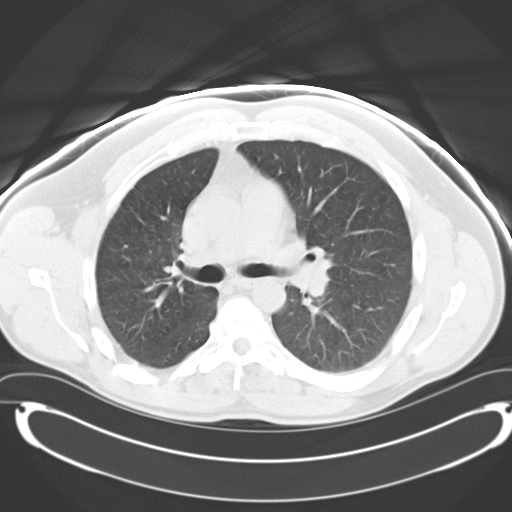
[im 41/61  mediastinal]
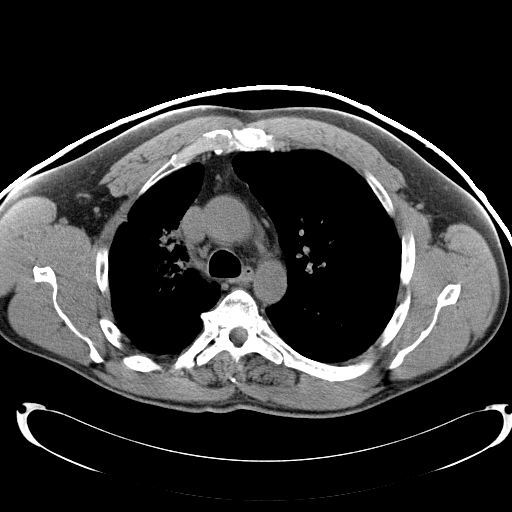
[im 41/61  lung]
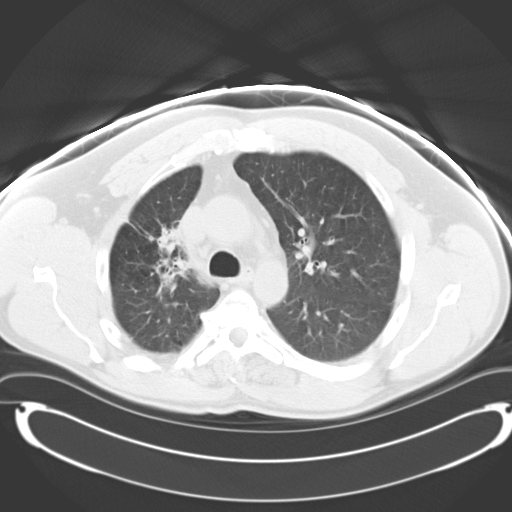
[im 46/61  lung]
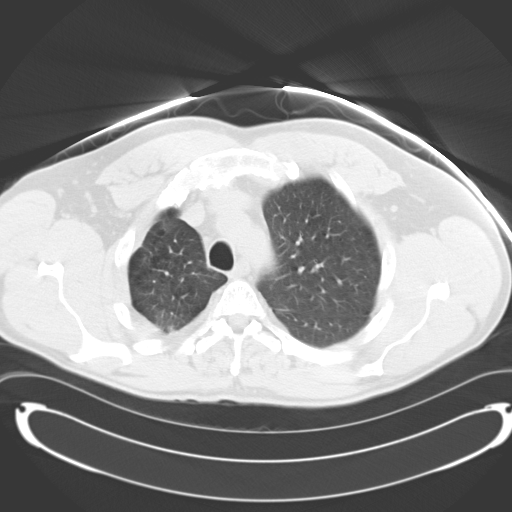
[im 51/61  lung]
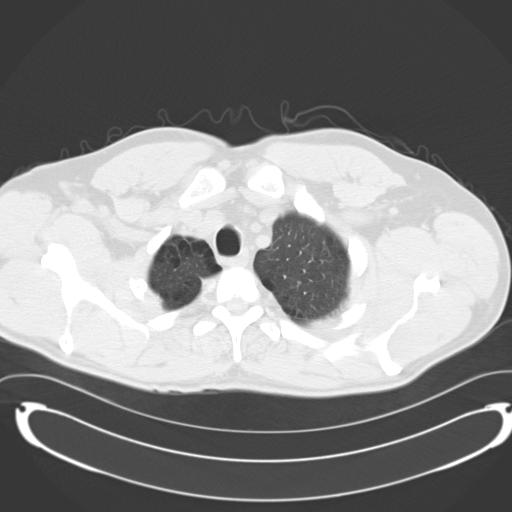
[im 56/61  lung]
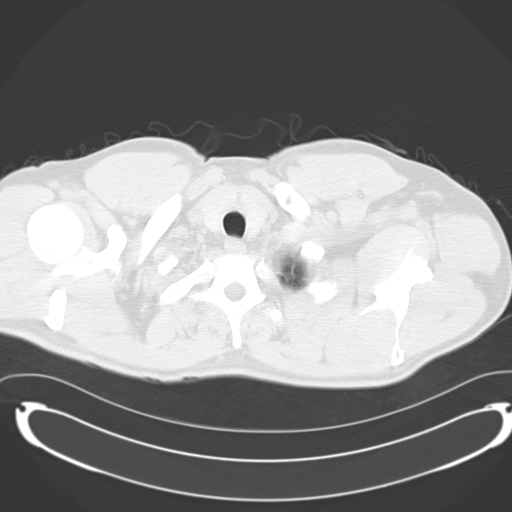

[12 of 31 positions shown; findings below may reference images not displayed]

FINDINGS: Increased geographic infiltrate is seen in the right
upper and middle lobes, which obscures visualization of the
previously seen less than 1 cm pulmonary nodules in these areas.
This is consistent with radiation pneumonitis.

A 9 mm pulmonary metastasis is seen in the posterior right lower
lobe on image 30 eight, which is mildly increased in size from 6 mm
on prior study.  Another pulmonary metastasis in the left lower
lobe on image 35 now measures 8 mm compared to 6 mm on prior study.
Other smaller pulmonary nodules, largest measuring 7 mm on image 17
in the left upper lobe and image 52 in the posterior right lower
lobe, are stable.  No new pulmonary metastases are identified.

No evidence of pleural or pericardial effusion.  No definite hilar
or mediastinal lymphadenopathy identified on this noncontrast exam.
Mild pulmonary  emphysema again noted.  The adrenal glands are
incompletely visualized.  No suspicious bone lesions are
identified.
IMPRESSION: 1.  Increased radiation pneumonitis mainly in the right upper and
middle lobes, which obscures previously seen pulmonary metastases
in this region.
2.  Mild increase in size of a few bilateral lower lobe pulmonary
metastases.
3.  Other small pulmonary metastases remains stable, and no new
sites of metastatic disease identified.

## 2012-08-24 ENCOUNTER — Other Ambulatory Visit: Payer: Self-pay | Admitting: *Deleted

## 2012-08-24 DIAGNOSIS — C649 Malignant neoplasm of unspecified kidney, except renal pelvis: Secondary | ICD-10-CM

## 2012-08-24 MED ORDER — SUNITINIB MALATE 50 MG PO CAPS: ORAL_CAPSULE | ORAL | Status: DC

## 2012-08-24 NOTE — Telephone Encounter (Signed)
THIS REFILL REQUEST WAS PLACED IN DR.SHADAD'S ACTIVE WORK FOLDER. 

## 2012-08-24 NOTE — Telephone Encounter (Signed)
RECEIVED A FAX FROM [facility] CONCERNING A CONFIRMATION OF FACSIMILE RECEIPT FOR PT.'S REFERRAL.

## 2012-08-24 NOTE — Telephone Encounter (Addendum)
THIS REFILL REQUEST FOR SUTENT WAS PLACED IN DR.SHADAD'S ACTIVE WORK FOLDER. 

## 2012-08-26 NOTE — Telephone Encounter (Signed)
RECEIVED A FAX FROM [facility] CONCERNING A CONFIRMATION OF PRESCRIPTION SHIPMENT FOR SUTENT ON 08/25/12.

## 2012-08-27 ENCOUNTER — Other Ambulatory Visit: Payer: Self-pay | Admitting: Oncology

## 2012-08-27 ENCOUNTER — Other Ambulatory Visit (HOSPITAL_BASED_OUTPATIENT_CLINIC_OR_DEPARTMENT_OTHER): Payer: BC Managed Care – PPO | Admitting: Lab

## 2012-08-27 ENCOUNTER — Ambulatory Visit (HOSPITAL_COMMUNITY)
Admission: RE | Admit: 2012-08-27 | Discharge: 2012-08-27 | Disposition: A | Discharge status: Home / Self Care | Payer: BC Managed Care – PPO | Source: Ambulatory Visit | Attending: Oncology | Admitting: Oncology

## 2012-08-27 DIAGNOSIS — F411 Generalized anxiety disorder: Secondary | ICD-10-CM

## 2012-08-27 DIAGNOSIS — R911 Solitary pulmonary nodule: Secondary | ICD-10-CM | POA: Insufficient documentation

## 2012-08-27 DIAGNOSIS — C649 Malignant neoplasm of unspecified kidney, except renal pelvis: Secondary | ICD-10-CM

## 2012-08-27 DIAGNOSIS — C801 Malignant (primary) neoplasm, unspecified: Secondary | ICD-10-CM

## 2012-08-27 DIAGNOSIS — F419 Anxiety disorder, unspecified: Secondary | ICD-10-CM

## 2012-08-27 DIAGNOSIS — C7800 Secondary malignant neoplasm of unspecified lung: Secondary | ICD-10-CM

## 2012-08-27 DIAGNOSIS — K869 Disease of pancreas, unspecified: Secondary | ICD-10-CM | POA: Insufficient documentation

## 2012-08-27 LAB — CBC WITH DIFFERENTIAL/PLATELET
BASO%: 0.5 % (ref 0.0–2.0)
Basophils Absolute: 0 10*3/uL (ref 0.0–0.1)
EOS%: 3.1 % (ref 0.0–7.0)
Eosinophils Absolute: 0.2 10*3/uL (ref 0.0–0.5)
HCT: 48.3 % (ref 38.4–49.9)
HGB: 16.5 g/dL (ref 13.0–17.1)
LYMPH%: 28.8 % (ref 14.0–49.0)
MCH: 36.1 pg — ABNORMAL HIGH (ref 27.2–33.4)
MCHC: 34.2 g/dL (ref 32.0–36.0)
MCV: 105.6 fL — ABNORMAL HIGH (ref 79.3–98.0)
MONO#: 0.3 10*3/uL (ref 0.1–0.9)
MONO%: 6 % (ref 0.0–14.0)
NEUT#: 3.2 10*3/uL (ref 1.5–6.5)
NEUT%: 61.6 % (ref 39.0–75.0)
Platelets: 135 10*3/uL — ABNORMAL LOW (ref 140–400)
RBC: 4.57 10*6/uL (ref 4.20–5.82)
RDW: 15.2 % — ABNORMAL HIGH (ref 11.0–14.6)
WBC: 5.2 10*3/uL (ref 4.0–10.3)
lymph#: 1.5 10*3/uL (ref 0.9–3.3)

## 2012-08-27 LAB — COMPREHENSIVE METABOLIC PANEL (CC13)
ALT: 54 U/L (ref 0–55)
AST: 34 U/L (ref 5–34)
Albumin: 4 g/dL (ref 3.5–5.0)
Alkaline Phosphatase: 101 U/L (ref 40–150)
BUN: 17 mg/dL (ref 7.0–26.0)
CO2: 27 mEq/L (ref 22–29)
Calcium: 9.3 mg/dL (ref 8.4–10.4)
Chloride: 101 mEq/L (ref 98–107)
Creatinine: 1.3 mg/dL (ref 0.7–1.3)
Glucose: 85 mg/dl (ref 70–99)
Potassium: 4.3 mEq/L (ref 3.5–5.1)
Sodium: 137 mEq/L (ref 136–145)
Total Bilirubin: 0.8 mg/dL (ref 0.20–1.20)
Total Protein: 7.9 g/dL (ref 6.4–8.3)

## 2012-08-27 MED ORDER — HYDROMORPHONE HCL 4 MG PO TABS: 4.0000 mg | ORAL_TABLET | ORAL | Status: AC | PRN

## 2012-08-27 MED ORDER — IOHEXOL 300 MG/ML  SOLN
100.0000 mL | Freq: Once | INTRAMUSCULAR | Status: AC | PRN
  Administered 2012-08-27: 100 mL via INTRAVENOUS

## 2012-08-30 ENCOUNTER — Telehealth: Payer: Self-pay | Admitting: *Deleted

## 2012-08-30 ENCOUNTER — Other Ambulatory Visit: Payer: Self-pay | Admitting: *Deleted

## 2012-08-30 DIAGNOSIS — C7800 Secondary malignant neoplasm of unspecified lung: Secondary | ICD-10-CM

## 2012-08-30 DIAGNOSIS — F419 Anxiety disorder, unspecified: Secondary | ICD-10-CM

## 2012-08-30 DIAGNOSIS — C649 Malignant neoplasm of unspecified kidney, except renal pelvis: Secondary | ICD-10-CM

## 2012-08-30 MED ORDER — ALPRAZOLAM 1 MG PO TABS: ORAL_TABLET | ORAL | Status: DC

## 2012-08-30 NOTE — Telephone Encounter (Signed)
Patient's wife called with concerns that husband is using drugs recreationally.  Dr. [name] made aware; patient scheduled for next appointment on 09/03/2012.

## 2012-09-01 ENCOUNTER — Other Ambulatory Visit: Payer: Self-pay | Admitting: Oncology

## 2012-09-03 ENCOUNTER — Ambulatory Visit (HOSPITAL_BASED_OUTPATIENT_CLINIC_OR_DEPARTMENT_OTHER): Payer: BC Managed Care – PPO | Admitting: Oncology

## 2012-09-03 ENCOUNTER — Telehealth: Payer: Self-pay | Admitting: Oncology

## 2012-09-03 VITALS — BP 136/87 | HR 65 | Temp 97.4°F | Resp 20 | Ht 72.5 in | Wt 212.6 lb

## 2012-09-03 DIAGNOSIS — C7800 Secondary malignant neoplasm of unspecified lung: Secondary | ICD-10-CM

## 2012-09-03 DIAGNOSIS — C649 Malignant neoplasm of unspecified kidney, except renal pelvis: Secondary | ICD-10-CM

## 2012-09-03 DIAGNOSIS — C7889 Secondary malignant neoplasm of other digestive organs: Secondary | ICD-10-CM

## 2012-09-03 DIAGNOSIS — F341 Dysthymic disorder: Secondary | ICD-10-CM

## 2012-09-03 NOTE — Progress Notes (Signed)
Hematology and Oncology Follow Up Visit  DANA BESTOR US:[id number] 04-04-1966 46 y.o. 09/03/2012 9:42 AM  [name], MD  [name], M.D.  [name], MD  [name], MD    Principle Diagnosis: This is a 46 year old gentleman with stage IV renal cell carcinoma diagnosed in 2009.  Prior Therapy: 1. Status post laparoscopic radical nephrectomy.  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 2. Patient status post thoracotomy done October 2009.  He had a lower lobe nodule, biopsy proven to be metastatic renal cell carcinoma.   3. Patient is status post stereotactic radiotherapy to pulmonary nodules in May of 2010.  Current therapy: He is on Sutent 50 mg 4 weeks on 2 weeks off since November of 2011, has stable disease. His disease include pancreatic involvement with RCC.  Interim History:  Mr. Stoneking presents today for a followup visit.  He has tolerated Sutent very well.  He has grade 1 diarrhea and for the most part has been doing fairly well with that.  He is not reporting any chest pain at this time.He is not reporting any abdominal pain.  He did report grade 1-2 hand-foot syndrome. His pain in the his feet as well as the ankles. Had not reported any other complications.His mood is much improved on Cymbalta. He is still working full time. He reports foot pain at times but still able to work. Medications: I have reviewed the patient's current medications. Current outpatient prescriptions:acetaminophen (TYLENOL) 325 MG tablet, Take 650 mg by mouth as needed.  , Disp: , Rfl: ;  ALPRAZolam (XANAX) 1 MG tablet, TAKE ONE TABLET BY MOUTH EVERY EIGHT HOURS PRN., Disp: 60 tablet, Rfl: 1;  AXIRON 30 MG/ACT SOLN, One tablet by mouth once daily , Disp: , Rfl: ;  clobetasol cream (TEMOVATE) 0.05 %, Apply topically 2 (two) times daily., Disp: 30 g, Rfl: 0 CYMBALTA 30 MG capsule, TAKE ONE CAPSULE EVERY DAY, Disp: 60 capsule, Rfl: 1;  gabapentin (NEURONTIN) 300 MG capsule,  TAKE 1 CAPSULE BY MOUTH AT BEDTIME, Disp: 60 capsule, Rfl: 1;  HYDROmorphone (DILAUDID) 4 MG tablet, Take 1 tablet (4 mg total) by mouth every 4 (four) hours as needed for pain., Disp: 50 tablet, Rfl: 0 oxycodone (OXY-IR) 5 MG capsule, Take 1-2 capsules (5-10 mg total) by mouth every 4 (four) hours as needed., Disp: 90 capsule, Rfl: 0;  pseudoephedrine-acetaminophen (TYLENOL SINUS) 30-500 MG TABS, Take 1 tablet by mouth every 4 (four) hours as needed., Disp: , Rfl: ;  SUNItinib (SUTENT) 50 MG capsule, Take 1 capsule by mouth daily for 28 days on, 14 days off., Disp: 28 capsule, Rfl: 0  Allergies:  Allergies  Allergen Reactions  . Ceftriaxone   . Hydrocodone     Past Medical History, Surgical history, Social history, and Family History were reviewed and updated.  Review of Systems: Constitutional:  Negative for fever, chills, night sweats, anorexia, weight loss, pain. Cardiovascular: no chest pain or dyspnea on exertion Respiratory: no cough, shortness of breath, or wheezing Neurological: no TIA or stroke symptoms Dermatological: negative ENT: negative Skin: Negative. Gastrointestinal: no abdominal pain, change in bowel habits, or black or bloody stools Genito-Urinary: no dysuria, trouble voiding, or hematuria Hematological and Lymphatic: negative Breast: negative Musculoskeletal: negative for - joint swelling, muscle pain or pain in foot - bilateral Remaining ROS negative.  Physical Exam: Blood pressure 136/87, pulse 65, temperature 97.4 F (36.3 C), temperature source Oral, resp. rate 20, height 6' 0.5" (1.842 m), weight 212  lb 9.6 oz (96.435 kg). ECOG: 0 General appearance: alert Head: Normocephalic, without obvious abnormality, atraumatic Neck: no adenopathy, no carotid bruit, no JVD, supple, symmetrical, trachea midline and thyroid not enlarged, symmetric, no tenderness/mass/nodules Lymph nodes: Cervical, supraclavicular, and axillary nodes normal. Heart:regular rate and  rhythm, S1, S2 normal, no murmur, click, rub or gallop Lung:chest clear, no wheezing, rales, normal symmetric air entry. Chest wall pain is reproducible with pressure. Abdomen: soft, non-tender, without masses or organomegaly EXT:no edema, no despumation. No erythema noted. Tender to touch at the ankle and shin area on both sides.    Lab Results: Lab Results  Component Value Date   WBC 5.2 08/27/2012   HGB 16.5 08/27/2012   HCT 48.3 08/27/2012   MCV 105.6* 08/27/2012   PLT 135* 08/27/2012     Chemistry      Component Value Date/Time   NA 137 08/27/2012 0751   NA 138 07/16/2012 1122   NA 139 04/30/2012 1134   K 4.3 08/27/2012 0751   K 5.1 07/16/2012 1122   K 4.3 04/30/2012 1134   CL 101 08/27/2012 0751   CL 106 07/16/2012 1122   CL 96* 04/30/2012 1134   CO2 27 08/27/2012 0751   CO2 30 07/16/2012 1122   CO2 29 04/30/2012 1134   BUN 17.0 08/27/2012 0751   BUN 12 07/16/2012 1122   BUN 14 04/30/2012 1134   CREATININE 1.3 08/27/2012 0751   CREATININE 1.15 07/16/2012 1122   CREATININE 1.4* 04/30/2012 1134      Component Value Date/Time   CALCIUM 9.3 08/27/2012 0751   CALCIUM 9.4 07/16/2012 1122   CALCIUM 8.5 04/30/2012 1134   ALKPHOS 101 08/27/2012 0751   ALKPHOS 77 07/16/2012 1122   ALKPHOS 93* 04/30/2012 1134   AST 34 08/27/2012 0751   AST 21 07/16/2012 1122   AST 36 04/30/2012 1134   ALT 54 08/27/2012 0751   ALT 34 07/16/2012 1122   BILITOT 0.80 08/27/2012 0751   BILITOT 0.4 07/16/2012 1122   BILITOT 0.70 04/30/2012 1134     CT CHEST, ABDOMEN AND PELVIS WITH CONTRAST  Technique: Multidetector CT imaging of the chest, abdomen and  pelvis was performed following the standard protocol during bolus  administration of intravenous contrast.  Contrast: 168mL OMNIPAQUE IOHEXOL 300 MG/ML SOLN  Comparison: 04/30/2012  CT CHEST  Findings: No axillary or supraclavicular adenopathy. There is no  mediastinal or hilar adenopathy. No pericardial or pleural  effusion identified. Changes of external beam radiation identified    within the right lung. Similar to previous exam. Stable pulmonary  nodule within the right upper lobe which measures 4 mm, image 26.  Postoperative change within the right lower lobe is identified.  Moderate changes of emphysema identified. Review of the bony  thorax shows some focal area of sclerosis involving the posterior  aspect of the right fourth rib.  IMPRESSION:  Stable CT of the chest. No specific features identified to suggest  residual or recurrence of tumor.  CT ABDOMEN AND PELVIS  Findings: There are no suspicious liver abnormalities. The  gallbladder appears normal. No biliary dilatation. Multiple  hyperenhancing lesions are again identified within the head and  neck of the pancreas. Index lesion within the neck of the pancreas  measures 1.6 x 2.0 cm, image 39. Previously 1.6 x 1.8 cm. Index  lesion within the head of pancreas measures 1.1 cm, image 49.  Previously this measured the same. Along the ventral surface of  the pancreatic head there is an 8  mm metastasis, image 52.  Previously 1 cm. Increased caliber of the pancreatic duct likely  reflects mass effect from pancreatic lesions. The spleen appears  normal.  Both adrenal glands are normal. Normal appearance of the right  kidney. Both adrenal glands appear normal. Status post left  nephrectomy. Urinary bladder is normal. Prostate gland and  seminal vesicles appear normal.  There are no enlarged lymph nodes within the upper abdomen. There  is no pelvic or inguinal adenopathy.  The stomach and the small bowel loops are within normal limits.  The colon is unremarkable.  No free fluid or fluid collections identified within the abdomen or  pelvis.  Review of the visualized bony structures is significant for  thoracic spondylosis. No aggressive lytic or sclerotic bone  lesions identified.  IMPRESSION:  1. No significant change in the appearance of multiple pancreatic  metastases.   Impression and Plan:  This is  a pleasant 46 year old gentleman with the following issues. 1. Metastatic renal cell carcinoma.  He has documented disease to the lung and the pancreas.  He tolerating Sutent well. He does have low grade hand-foot syndrome. The plan is to continue Sutent with current dose and schedule. CT scan from 08/2012 showed stable disease.  2. Pancreatic thickening/mass.  This has been biopsy proven to be a renal cell metastasis. No change in treatment at this time. 3. Depression/Anxiety: mood much improved on Cymbalta and Xanax.  4. Foot/lower legs  pain: unclear etiology. I will not prescribe any narcotic at this time. I offered him Ultram for future use if needed.   5. Follow up: On 10/13.  Sharalee Witman 9/13/20139:42 AM

## 2012-09-03 NOTE — Telephone Encounter (Signed)
Gave pt appt for 10/08/12 lab and ML

## 2012-10-08 ENCOUNTER — Encounter: Payer: Self-pay | Admitting: Oncology

## 2012-10-08 ENCOUNTER — Other Ambulatory Visit (HOSPITAL_BASED_OUTPATIENT_CLINIC_OR_DEPARTMENT_OTHER): Payer: BC Managed Care – PPO | Admitting: Lab

## 2012-10-08 ENCOUNTER — Telehealth: Payer: Self-pay | Admitting: Oncology

## 2012-10-08 ENCOUNTER — Ambulatory Visit (HOSPITAL_BASED_OUTPATIENT_CLINIC_OR_DEPARTMENT_OTHER): Payer: BC Managed Care – PPO | Admitting: Oncology

## 2012-10-08 VITALS — BP 126/83 | HR 70 | Temp 97.4°F | Resp 20 | Ht 72.5 in | Wt 215.9 lb

## 2012-10-08 DIAGNOSIS — C7800 Secondary malignant neoplasm of unspecified lung: Secondary | ICD-10-CM

## 2012-10-08 DIAGNOSIS — C7889 Secondary malignant neoplasm of other digestive organs: Secondary | ICD-10-CM

## 2012-10-08 DIAGNOSIS — C649 Malignant neoplasm of unspecified kidney, except renal pelvis: Secondary | ICD-10-CM

## 2012-10-08 DIAGNOSIS — F419 Anxiety disorder, unspecified: Secondary | ICD-10-CM

## 2012-10-08 DIAGNOSIS — M79609 Pain in unspecified limb: Secondary | ICD-10-CM

## 2012-10-08 LAB — COMPREHENSIVE METABOLIC PANEL (CC13)
ALT: 43 U/L (ref 0–55)
AST: 29 U/L (ref 5–34)
Albumin: 4 g/dL (ref 3.5–5.0)
Alkaline Phosphatase: 98 U/L (ref 40–150)
BUN: 14 mg/dL (ref 7.0–26.0)
CO2: 28 mEq/L (ref 22–29)
Calcium: 9.6 mg/dL (ref 8.4–10.4)
Chloride: 105 mEq/L (ref 98–107)
Creatinine: 1.3 mg/dL (ref 0.7–1.3)
Glucose: 59 mg/dl — ABNORMAL LOW (ref 70–99)
Potassium: 4.8 mEq/L (ref 3.5–5.1)
Sodium: 140 mEq/L (ref 136–145)
Total Bilirubin: 0.6 mg/dL (ref 0.20–1.20)
Total Protein: 7.2 g/dL (ref 6.4–8.3)

## 2012-10-08 LAB — CBC WITH DIFFERENTIAL/PLATELET
BASO%: 0.4 % (ref 0.0–2.0)
Basophils Absolute: 0 10*3/uL (ref 0.0–0.1)
EOS%: 1.6 % (ref 0.0–7.0)
Eosinophils Absolute: 0.1 10*3/uL (ref 0.0–0.5)
HCT: 44.6 % (ref 38.4–49.9)
HGB: 15 g/dL (ref 13.0–17.1)
LYMPH%: 29.5 % (ref 14.0–49.0)
MCH: 36 pg — ABNORMAL HIGH (ref 27.2–33.4)
MCHC: 33.6 g/dL (ref 32.0–36.0)
MCV: 107 fL — ABNORMAL HIGH (ref 79.3–98.0)
MONO#: 0.3 10*3/uL (ref 0.1–0.9)
MONO%: 5.5 % (ref 0.0–14.0)
NEUT#: 3.5 10*3/uL (ref 1.5–6.5)
NEUT%: 63 % (ref 39.0–75.0)
Platelets: 121 10*3/uL — ABNORMAL LOW (ref 140–400)
RBC: 4.17 10*6/uL — ABNORMAL LOW (ref 4.20–5.82)
RDW: 14.7 % — ABNORMAL HIGH (ref 11.0–14.6)
WBC: 5.6 10*3/uL (ref 4.0–10.3)
lymph#: 1.7 10*3/uL (ref 0.9–3.3)
nRBC: 0 % (ref 0–0)

## 2012-10-08 MED ORDER — ALPRAZOLAM 1 MG PO TABS: ORAL_TABLET | ORAL | Status: DC

## 2012-10-08 NOTE — Progress Notes (Signed)
Hematology and Oncology Follow Up Visit  Keith [name] US:[id number] 09-10-1966 46 y.o. 10/10/2012 2:39 PM  Keith Bring, MD  Keith [name], M.D.  Keith Bent, MD  Keith Banister, MD    Principle Diagnosis: This is a 46 year old gentleman with stage IV renal cell carcinoma diagnosed in 2009.  Prior Therapy: 1. Status post laparoscopic radical nephrectomy.  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 2. Patient status post thoracotomy done October 2009.  He had a lower lobe nodule, biopsy proven to be metastatic renal cell carcinoma.   3. Patient is status post stereotactic radiotherapy to pulmonary nodules in May of 2010.  Current therapy: He is on Sutent 50 mg 4 weeks on 2 weeks off since November of 2011, has stable disease. His disease include pancreatic involvement with RCC.  Interim History:  Keith [name] presents today for a followup visit.  He has tolerated Sutent very well.  He has grade 1 diarrhea and for the most part has been doing fairly well with that.  He is not reporting any chest pain at this time.He is not reporting any abdominal pain.  He did report grade 1-2 hand-foot syndrome. His pain in the his feet as well as the ankles. He has been referred to a Rheumatologist by his PCP, but he did not go to this appointment. Has not reported any other complications.His mood is much improved on Cymbalta. He is still working full time. He reports foot pain at times but still able to work.  Medications: I have reviewed the patient's current medications. Current outpatient prescriptions:acetaminophen (TYLENOL) 325 MG tablet, Take 650 mg by mouth as needed.  , Disp: , Rfl: ;  ALPRAZolam (XANAX) 1 MG tablet, TAKE ONE TABLET BY MOUTH EVERY EIGHT HOURS PRN., Disp: 60 tablet, Rfl: 1;  AXIRON 30 MG/ACT SOLN, One tablet by mouth once daily , Disp: , Rfl: ;  clobetasol cream (TEMOVATE) 0.05 %, Apply topically 2 (two) times daily., Disp: 30 g, Rfl: 0 CYMBALTA 30 MG capsule,  TAKE ONE CAPSULE EVERY DAY, Disp: 60 capsule, Rfl: 1;  gabapentin (NEURONTIN) 300 MG capsule, TAKE 1 CAPSULE BY MOUTH AT BEDTIME, Disp: 60 capsule, Rfl: 1;  oxycodone (OXY-IR) 5 MG capsule, Take 1-2 capsules (5-10 mg total) by mouth every 4 (four) hours as needed., Disp: 90 capsule, Rfl: 0;  pseudoephedrine-acetaminophen (TYLENOL SINUS) 30-500 MG TABS, Take 1 tablet by mouth every 4 (four) hours as needed., Disp: , Rfl:  SUNItinib (SUTENT) 50 MG capsule, Take 1 capsule by mouth daily for 28 days on, 14 days off., Disp: 28 capsule, Rfl: 0  Allergies:  Allergies  Allergen Reactions  . Ceftriaxone   . Hydrocodone     Past Medical History, Surgical history, Social history, and Family History were reviewed and updated.  Review of Systems: Constitutional:  Negative for fever, chills, night sweats, anorexia, weight loss, pain. Cardiovascular: no chest pain or dyspnea on exertion Respiratory: no cough, shortness of breath, or wheezing Neurological: no TIA or stroke symptoms Dermatological: negative ENT: negative Skin: Negative. Gastrointestinal: no abdominal pain, change in bowel habits, or black or bloody stools Genito-Urinary: no dysuria, trouble voiding, or hematuria Hematological and Lymphatic: negative Breast: negative Musculoskeletal: negative for - joint swelling, muscle pain or pain in foot - bilateral Remaining ROS negative.  Physical Exam: Blood pressure 126/83, pulse 70, temperature 97.4 F (36.3 C), temperature source Oral, resp. rate 20, height 6' 0.5" (1.842 m), weight 215 lb 14.4 oz (97.932 kg). ECOG: 0 General  appearance: alert Head: Normocephalic, without obvious abnormality, atraumatic Neck: no adenopathy, no carotid bruit, no JVD, supple, symmetrical, trachea midline and thyroid not enlarged, symmetric, no tenderness/mass/nodules Lymph nodes: Cervical, supraclavicular, and axillary nodes normal. Heart:regular rate and rhythm, S1, S2 normal, no murmur, click, rub or  gallop Lung:chest clear, no wheezing, rales, normal symmetric air entry. Chest wall pain is reproducible with pressure. Abdomen: soft, non-tender, without masses or organomegaly EXT:no edema, no despumation. No erythema noted. Tender to touch at the ankle and shin area on both sides.    Lab Results: Lab Results  Component Value Date   WBC 5.6 10/08/2012   HGB 15.0 10/08/2012   HCT 44.6 10/08/2012   MCV 107.0* 10/08/2012   PLT 121* 10/08/2012     Chemistry      Component Value Date/Time   NA 140 10/08/2012 1042   NA 138 07/16/2012 1122   NA 139 04/30/2012 1134   K 4.8 10/08/2012 1042   K 5.1 07/16/2012 1122   K 4.3 04/30/2012 1134   CL 105 10/08/2012 1042   CL 106 07/16/2012 1122   CL 96* 04/30/2012 1134   CO2 28 10/08/2012 1042   CO2 30 07/16/2012 1122   CO2 29 04/30/2012 1134   BUN 14.0 10/08/2012 1042   BUN 12 07/16/2012 1122   BUN 14 04/30/2012 1134   CREATININE 1.3 10/08/2012 1042   CREATININE 1.15 07/16/2012 1122   CREATININE 1.4* 04/30/2012 1134      Component Value Date/Time   CALCIUM 9.6 10/08/2012 1042   CALCIUM 9.4 07/16/2012 1122   CALCIUM 8.5 04/30/2012 1134   ALKPHOS 98 10/08/2012 1042   ALKPHOS 77 07/16/2012 1122   ALKPHOS 93* 04/30/2012 1134   AST 29 10/08/2012 1042   AST 21 07/16/2012 1122   AST 36 04/30/2012 1134   ALT 43 10/08/2012 1042   ALT 34 07/16/2012 1122   BILITOT 0.60 10/08/2012 1042   BILITOT 0.4 07/16/2012 1122   BILITOT 0.70 04/30/2012 1134      Impression and Plan:  This is a pleasant 46 year old gentleman with the following issues. 1. Metastatic renal cell carcinoma.  He has documented disease to the lung and the pancreas.  He tolerating Sutent well. He does have low grade hand-foot syndrome. The plan is to continue Sutent with current dose and schedule. CT scan from 08/2012 showed stable disease.  2. Pancreatic thickening/mass.  This has been biopsy proven to be a renal cell metastasis. No change in treatment at this time. 3. Depression/Anxiety:  mood much improved on Cymbalta and Xanax.  4. Foot/lower legs  pain: unclear etiology. I will not prescribe any narcotic at this time. I offered him Ultram for future use if needed.  Recommend that he follow-up with Rheumatology per [facility] recommendation. 5. Follow up: 4-5 weeks.  Jamorris Ndiaye 10/20/20132:39 PM

## 2012-10-08 NOTE — Telephone Encounter (Signed)
gv and printed appt [facility] for pt for NOV

## 2012-10-12 ENCOUNTER — Other Ambulatory Visit: Payer: Self-pay

## 2012-10-12 ENCOUNTER — Emergency Department (HOSPITAL_COMMUNITY): Payer: BC Managed Care – PPO

## 2012-10-12 ENCOUNTER — Emergency Department (HOSPITAL_COMMUNITY)
Admission: EM | Admit: 2012-10-12 | Discharge: 2012-10-12 | Disposition: A | Discharge status: Home / Self Care | Payer: BC Managed Care – PPO | Attending: Emergency Medicine | Admitting: Emergency Medicine

## 2012-10-12 ENCOUNTER — Encounter (HOSPITAL_COMMUNITY): Payer: Self-pay | Admitting: Emergency Medicine

## 2012-10-12 DIAGNOSIS — F17200 Nicotine dependence, unspecified, uncomplicated: Secondary | ICD-10-CM | POA: Insufficient documentation

## 2012-10-12 DIAGNOSIS — Z79899 Other long term (current) drug therapy: Secondary | ICD-10-CM | POA: Insufficient documentation

## 2012-10-12 DIAGNOSIS — R42 Dizziness and giddiness: Secondary | ICD-10-CM

## 2012-10-12 DIAGNOSIS — Z9889 Other specified postprocedural states: Secondary | ICD-10-CM | POA: Insufficient documentation

## 2012-10-12 DIAGNOSIS — C7800 Secondary malignant neoplasm of unspecified lung: Secondary | ICD-10-CM | POA: Insufficient documentation

## 2012-10-12 DIAGNOSIS — C649 Malignant neoplasm of unspecified kidney, except renal pelvis: Secondary | ICD-10-CM | POA: Insufficient documentation

## 2012-10-12 DIAGNOSIS — N419 Inflammatory disease of prostate, unspecified: Secondary | ICD-10-CM

## 2012-10-12 DIAGNOSIS — C7889 Secondary malignant neoplasm of other digestive organs: Secondary | ICD-10-CM | POA: Insufficient documentation

## 2012-10-12 DIAGNOSIS — R51 Headache: Secondary | ICD-10-CM | POA: Insufficient documentation

## 2012-10-12 LAB — CBC WITH DIFFERENTIAL/PLATELET
Basophils Absolute: 0 10*3/uL (ref 0.0–0.1)
Basophils Relative: 0 % (ref 0–1)
Eosinophils Absolute: 0.1 10*3/uL (ref 0.0–0.7)
Eosinophils Relative: 1 % (ref 0–5)
HCT: 43.5 % (ref 39.0–52.0)
Hemoglobin: 14.6 g/dL (ref 13.0–17.0)
Lymphocytes Relative: 28 % (ref 12–46)
Lymphs Abs: 1.4 10*3/uL (ref 0.7–4.0)
MCH: 35.9 pg — ABNORMAL HIGH (ref 26.0–34.0)
MCHC: 33.6 g/dL (ref 30.0–36.0)
MCV: 106.9 fL — ABNORMAL HIGH (ref 78.0–100.0)
Monocytes Absolute: 0.5 10*3/uL (ref 0.1–1.0)
Monocytes Relative: 9 % (ref 3–12)
Neutro Abs: 3.2 10*3/uL (ref 1.7–7.7)
Neutrophils Relative %: 62 % (ref 43–77)
Platelets: 173 10*3/uL (ref 150–400)
RBC: 4.07 MIL/uL — ABNORMAL LOW (ref 4.22–5.81)
RDW: 14.7 % (ref 11.5–15.5)
WBC: 5.1 10*3/uL (ref 4.0–10.5)

## 2012-10-12 LAB — BASIC METABOLIC PANEL
BUN: 12 mg/dL (ref 6–23)
CO2: 28 mEq/L (ref 19–32)
Calcium: 9.7 mg/dL (ref 8.4–10.5)
Chloride: 101 mEq/L (ref 96–112)
Creatinine, Ser: 1.1 mg/dL (ref 0.50–1.35)
GFR calc Af Amer: >90 mL/min (ref >90)
GFR calc non Af Amer: 79 mL/min — ABNORMAL LOW (ref >90)
Glucose, Bld: 98 mg/dL (ref 70–99)
Potassium: 4.2 mEq/L (ref 3.5–5.1)
Sodium: 138 mEq/L (ref 135–145)

## 2012-10-12 LAB — URINALYSIS, ROUTINE W REFLEX MICROSCOPIC
Bilirubin Urine: NEGATIVE
Glucose, UA: NEGATIVE mg/dL
Hgb urine dipstick: NEGATIVE
Ketones, ur: NEGATIVE mg/dL
Leukocytes, UA: NEGATIVE
Nitrite: NEGATIVE
Protein, ur: NEGATIVE mg/dL
Specific Gravity, Urine: 1.008 (ref 1.005–1.030)
Urobilinogen, UA: 0.2 mg/dL (ref 0.0–1.0)
pH: 7 (ref 5.0–8.0)

## 2012-10-12 LAB — OCCULT BLOOD, POC DEVICE: Fecal Occult Bld: NEGATIVE

## 2012-10-12 LAB — GLUCOSE, CAPILLARY: Glucose-Capillary: 105 mg/dL — ABNORMAL HIGH (ref 70–99)

## 2012-10-12 MED ORDER — FENTANYL CITRATE 0.05 MG/ML IJ SOLN
50.0000 ug | Freq: Once | INTRAMUSCULAR | Status: AC
  Administered 2012-10-12: 50 ug via INTRAVENOUS
  Filled 2012-10-12: qty 2

## 2012-10-12 MED ORDER — MECLIZINE HCL 25 MG PO TABS
25.0000 mg | ORAL_TABLET | Freq: Once | ORAL | Status: AC
  Administered 2012-10-12: 25 mg via ORAL
  Filled 2012-10-12: qty 1

## 2012-10-12 MED ORDER — LORAZEPAM 1 MG PO TABS: 1.0000 mg | ORAL_TABLET | Freq: Three times a day (TID) | ORAL | Status: DC | PRN

## 2012-10-12 MED ORDER — CIPROFLOXACIN HCL 500 MG PO TABS: 500.0000 mg | ORAL_TABLET | Freq: Two times a day (BID) | ORAL | Status: DC

## 2012-10-12 MED ORDER — MECLIZINE HCL 25 MG PO TABS: 25.0000 mg | ORAL_TABLET | Freq: Four times a day (QID) | ORAL | Status: DC

## 2012-10-12 MED ORDER — SODIUM CHLORIDE 0.9 % IV BOLUS (SEPSIS)
1000.0000 mL | Freq: Once | INTRAVENOUS | Status: AC
  Administered 2012-10-12: 1000 mL via INTRAVENOUS

## 2012-10-12 MED ORDER — LORAZEPAM 2 MG/ML IJ SOLN
1.0000 mg | Freq: Once | INTRAMUSCULAR | Status: AC
  Administered 2012-10-12: 1 mg via INTRAVENOUS
  Filled 2012-10-12: qty 1

## 2012-10-12 NOTE — ED Notes (Signed)
Pt c/o being weak on rt side.  All other stroke scale negative.  During neuro assessment, rt side does seem to be mildly weaker.

## 2012-10-12 NOTE — ED Notes (Signed)
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  Pt only has one kidney.  Also c/o rectal pain x 2 days.  HA pain 8/10.  HA came on suddenly.  Pt points to HA pain coming from top of head.  Also c/o blurred vision/cold sweat.

## 2012-10-12 NOTE — ED Provider Notes (Addendum)
History     CSN: [id number]  Arrival date & time 10/12/12  [id number]   First MD Initiated Contact with Patient 10/12/12 [phone]      Chief Complaint  Patient presents with  . Headache  . Dizziness  . Cancer    (Consider location/radiation/quality/duration/timing/severity/associated sxs/prior treatment) HPI.... dizziness and headache this morning at work.   patient has stage IV renal cancer status post left nephrectomy in 2009 with metastasis.  And chemotherapy time 28 days of 14 days.  No mental status changes, stiff neck, fever, chills, dysuria, cough, chest pain.  Standing makes symptoms worse. Severity is moderate.  Also complains of rectal pain  Past Medical History  Diagnosis Date  . Pancreatic mass   . Renal cell cancer     renal cell ca dx 06/2008  . Metastasis to lung dx'd 06/2008  . 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 Metastasis to lung     Past Surgical History  Procedure Date  . Nephrectomy radical     Left   . Lung removal, partial 09/2008    Family History  Problem Relation Age of Onset  . Diabetes Brother   . Irritable bowel syndrome Sister   . Colon cancer Neg Hx     History  Substance Use Topics  . Smoking status: Current Every Day Smoker    Types: Cigarettes  . Smokeless tobacco: Never Used  . Alcohol Use: No      Review of Systems  All other systems reviewed and are negative.    Allergies  Ceftriaxone and Hydrocodone  Home Medications   Current Outpatient Rx  Name Route Sig Dispense Refill  . ACETAMINOPHEN 325 MG PO TABS Oral Take 650 mg by mouth as needed.      . ALPRAZOLAM 1 MG PO TABS  TAKE ONE TABLET BY MOUTH EVERY EIGHT HOURS PRN. 60 tablet 1  . AXIRON 30 MG/ACT TD SOLN  One tablet by mouth once daily     . DULOXETINE HCL 30 MG PO CPEP Oral Take 30 mg by mouth daily.    [name] GABAPENTIN 300 MG PO CAPS Oral Take 300 mg by mouth at bedtime.    . IBUPROFEN 200 MG PO TABS Oral Take 200 mg by mouth every 6 (six) hours as needed. pain      . OXYCODONE HCL 5 MG PO CAPS Oral Take 1-2 capsules (5-10 mg total) by mouth every 4 (four) hours as needed. 90 capsule 0  . PSEUDOEPHEDRINE-ACETAMINOPHEN 30-500 MG PO TABS Oral Take 1 tablet by mouth every 4 (four) hours as needed.    . SUNITINIB MALATE 50 MG PO CAPS  Take 1 capsule by mouth daily for 28 days on, 14 days off. 28 capsule 0    Faxed to [facility] at [phone]  . CIPROFLOXACIN HCL 500 MG PO TABS Oral Take 1 tablet (500 mg total) by mouth 2 (two) times daily. 20 tablet 0  . LORAZEPAM 1 MG PO TABS Oral Take 1 tablet (1 mg total) by mouth 3 (three) times daily as needed for anxiety. 15 tablet 0  . MECLIZINE HCL 25 MG PO TABS Oral Take 1 tablet (25 mg total) by mouth 4 (four) times daily. 15 tablet 0    BP 118/92  Pulse 57  Temp 98 F (36.7 C) (Oral)  Resp 12  SpO2 99%  Physical Exam  Nursing note and vitals reviewed. Constitutional: He is oriented to person, place, and time. He appears well-developed and well-nourished.  HENT:  Head: Normocephalic and atraumatic.  Eyes: Conjunctivae normal and EOM are normal. Pupils are equal, round, and reactive to light.  Neck: Normal range of motion. Neck supple.  Cardiovascular: Normal rate, regular rhythm and normal heart sounds.   Pulmonary/Chest: Effort normal and breath sounds normal.  Abdominal: Soft. Bowel sounds are normal.  Genitourinary:       Rectal exam: No masses, heme-negative  Musculoskeletal: Normal range of motion.  Neurological: He is alert and oriented to person, place, and time.       No gross neuro deficits, however slightly ataxic on standing and walking  Skin: Skin is warm and dry.  Psychiatric: He has a normal mood and affect.    ED Course  Procedures (including critical care time)  Labs Reviewed  CBC WITH DIFFERENTIAL - Abnormal; Notable for the following:    RBC 4.07 (*)     MCV 106.9 (*)     MCH 35.9 (*)     All other components within normal limits  BASIC METABOLIC PANEL - Abnormal; Notable  for the following:    GFR calc non Af Amer 79 (*)     All other components within normal limits  URINALYSIS, ROUTINE W REFLEX MICROSCOPIC - Abnormal; Notable for the following:    APPearance CLOUDY (*)     All other components within normal limits  GLUCOSE, CAPILLARY - Abnormal; Notable for the following:    Glucose-Capillary 105 (*)     All other components within normal limits  OCCULT BLOOD, POC DEVICE   Ct Head Wo Contrast  10/12/2012  *RADIOLOGY REPORT*  Clinical Data: Headache, metastatic renal cell carcinoma  CT HEAD WITHOUT CONTRAST  Technique:  Contiguous axial images were obtained from the base of the skull through the vertex without contrast.  Comparison: Head CT 06/27/2011  Findings: No acute intracranial hemorrhage.  No focal mass lesion. No CT evidence of acute infarction.   No midline shift or mass effect.  No hydrocephalus.  Basilar cisterns are patent. Paranasal sinuses and mastoid air cells are clear.  Orbits are normal. A prominent right MCA branch is again demonstrated.  IMPRESSION:  No acute intracranial findings.  No evidence metastasis.   Original Report Authenticated By: Suzy Bouchard, M.D.      1. Vertigo   2. Prostatitis       MDM  CT head negative.  Feeling better.  Discharge home on Antivert 25 mg #15, Ativan 1 mg #15, Cipro 500 mg twice a day for one week for prostatitis. Discussed with patient and his wife        Nat Christen, MD 10/12/12 1518  Nat Christen, MD 10/12/12 [phone]

## 2012-10-14 ENCOUNTER — Encounter: Payer: Self-pay | Admitting: *Deleted

## 2012-10-14 NOTE — Progress Notes (Signed)
RECEIVED A FAX FROM [facility] CONCERNING A CONFIRMATION OF PRESCRIPTION SHIPMENT FOR [facility] ON 10/13/12.

## 2012-10-15 ENCOUNTER — Emergency Department (HOSPITAL_COMMUNITY): Payer: BC Managed Care – PPO

## 2012-10-15 ENCOUNTER — Emergency Department (HOSPITAL_COMMUNITY)
Admission: EM | Admit: 2012-10-15 | Discharge: 2012-10-15 | Disposition: A | Discharge status: Home / Self Care | Payer: BC Managed Care – PPO | Attending: Emergency Medicine | Admitting: Emergency Medicine

## 2012-10-15 ENCOUNTER — Encounter (HOSPITAL_COMMUNITY): Payer: Self-pay | Admitting: *Deleted

## 2012-10-15 DIAGNOSIS — Z905 Acquired absence of kidney: Secondary | ICD-10-CM | POA: Insufficient documentation

## 2012-10-15 DIAGNOSIS — Z792 Long term (current) use of antibiotics: Secondary | ICD-10-CM | POA: Insufficient documentation

## 2012-10-15 DIAGNOSIS — K644 Residual hemorrhoidal skin tags: Secondary | ICD-10-CM | POA: Insufficient documentation

## 2012-10-15 DIAGNOSIS — F17200 Nicotine dependence, unspecified, uncomplicated: Secondary | ICD-10-CM | POA: Insufficient documentation

## 2012-10-15 DIAGNOSIS — Z85528 Personal history of other malignant neoplasm of kidney: Secondary | ICD-10-CM | POA: Insufficient documentation

## 2012-10-15 DIAGNOSIS — Z8719 Personal history of other diseases of the digestive system: Secondary | ICD-10-CM | POA: Insufficient documentation

## 2012-10-15 DIAGNOSIS — R55 Syncope and collapse: Secondary | ICD-10-CM

## 2012-10-15 DIAGNOSIS — Z79899 Other long term (current) drug therapy: Secondary | ICD-10-CM | POA: Insufficient documentation

## 2012-10-15 DIAGNOSIS — R42 Dizziness and giddiness: Secondary | ICD-10-CM | POA: Insufficient documentation

## 2012-10-15 LAB — CBC WITH DIFFERENTIAL/PLATELET
Basophils Absolute: 0 10*3/uL (ref 0.0–0.1)
Basophils Relative: 0 % (ref 0–1)
Eosinophils Absolute: 0 10*3/uL (ref 0.0–0.7)
Eosinophils Relative: 1 % (ref 0–5)
HCT: 44 % (ref 39.0–52.0)
Hemoglobin: 14.7 g/dL (ref 13.0–17.0)
Lymphocytes Relative: 12 % (ref 12–46)
Lymphs Abs: 0.9 10*3/uL (ref 0.7–4.0)
MCH: 35.7 pg — ABNORMAL HIGH (ref 26.0–34.0)
MCHC: 33.4 g/dL (ref 30.0–36.0)
MCV: 106.8 fL — ABNORMAL HIGH (ref 78.0–100.0)
Monocytes Absolute: 0.7 10*3/uL (ref 0.1–1.0)
Monocytes Relative: 9 % (ref 3–12)
Neutro Abs: 6.1 10*3/uL (ref 1.7–7.7)
Neutrophils Relative %: 79 % — ABNORMAL HIGH (ref 43–77)
Platelets: 148 10*3/uL — ABNORMAL LOW (ref 150–400)
RBC: 4.12 MIL/uL — ABNORMAL LOW (ref 4.22–5.81)
RDW: 14.2 % (ref 11.5–15.5)
WBC: 7.8 10*3/uL (ref 4.0–10.5)

## 2012-10-15 LAB — BASIC METABOLIC PANEL
BUN: 18 mg/dL (ref 6–23)
CO2: 29 mEq/L (ref 19–32)
Calcium: 10 mg/dL (ref 8.4–10.5)
Chloride: 98 mEq/L (ref 96–112)
Creatinine, Ser: 1.31 mg/dL (ref 0.50–1.35)
GFR calc Af Amer: 75 mL/min — ABNORMAL LOW (ref >90)
GFR calc non Af Amer: 64 mL/min — ABNORMAL LOW (ref >90)
Glucose, Bld: 172 mg/dL — ABNORMAL HIGH (ref 70–99)
Potassium: 4.9 mEq/L (ref 3.5–5.1)
Sodium: 136 mEq/L (ref 135–145)

## 2012-10-15 LAB — MAGNESIUM: Magnesium: 1.6 mg/dL (ref 1.5–2.5)

## 2012-10-15 MED ORDER — MORPHINE SULFATE 4 MG/ML IJ SOLN
4.0000 mg | Freq: Once | INTRAMUSCULAR | Status: AC
  Administered 2012-10-15: 4 mg via INTRAVENOUS
  Filled 2012-10-15: qty 1

## 2012-10-15 MED ORDER — DOCUSATE SODIUM 100 MG PO CAPS: 100.0000 mg | ORAL_CAPSULE | Freq: Two times a day (BID) | ORAL | Status: DC

## 2012-10-15 MED ORDER — HYDROCORTISONE 2.5 % RE CREA: TOPICAL_CREAM | RECTAL | Status: DC

## 2012-10-15 MED ORDER — IOHEXOL 350 MG/ML SOLN
100.0000 mL | Freq: Once | INTRAVENOUS | Status: AC | PRN
  Administered 2012-10-15: 100 mL via INTRAVENOUS

## 2012-10-15 MED ORDER — SODIUM CHLORIDE 0.9 % IV BOLUS (SEPSIS)
1000.0000 mL | Freq: Once | INTRAVENOUS | Status: AC
  Administered 2012-10-15: 1000 mL via INTRAVENOUS

## 2012-10-15 MED ORDER — ACETAMINOPHEN-CODEINE #3 300-30 MG PO TABS: 1.0000 | ORAL_TABLET | Freq: Four times a day (QID) | ORAL | Status: DC | PRN

## 2012-10-15 NOTE — ED Notes (Signed)
Pt reports standing approx 1hr pta and breaking out in a "cold sweat" due to the rectal pain. Sts he fell and was unconscious for a few seconds and hit head on wooden floor. C/o HA, lightheadedness, rectal pain.

## 2012-10-15 NOTE — ED Provider Notes (Signed)
History     CSN: [id number]  Arrival date & time 10/15/12  1737   First MD Initiated Contact with Patient 10/15/12 1753      Chief Complaint  Patient presents with  . Rectal Pain  . Loss of Consciousness    (Consider location/radiation/quality/duration/timing/severity/associated sxs/prior treatment) HPI Comments: Pt comes in with cc of vertigo, syncope and rectal pain. Pt reports that he has been having rectal pain x 1 month now, it is a constant pain, sharp, and is worse with defecation and with him sitting. He has no bloody stools, no mucus discharge. Pt also reports an episode of vertigo this afternoon, which got worse when he got up, and then he passed out. He had some palpitations, but no chest pain, sob. No hx of syncope, no premature CAd in the family, no PE hx.   Patient is a 46 y.o. male presenting with syncope. The history is provided by the patient.  Loss of Consciousness Pertinent negatives include no chest pain, no headaches and no shortness of breath.    Past Medical History  Diagnosis Date  . Pancreatic mass   . Renal cell cancer     renal cell ca dx 06/2008  . Metastasis to lung dx'd 06/2008  . 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 Metastasis to lung     Past Surgical History  Procedure Date  . Nephrectomy radical     Left   . Lung removal, partial 09/2008    Family History  Problem Relation Age of Onset  . Diabetes Brother   . Irritable bowel syndrome Sister   . Colon cancer Neg Hx     History  Substance Use Topics  . Smoking status: Current Every Day Smoker    Types: Cigarettes  . Smokeless tobacco: Never Used  . Alcohol Use: No      Review of Systems  Constitutional: Negative for fever, chills and activity change.  HENT: Negative for neck pain.   Eyes: Negative for visual disturbance.  Respiratory: Negative for cough, chest tightness and shortness of breath.   Cardiovascular: Positive for palpitations and syncope. Negative for  chest pain.  Gastrointestinal: Positive for rectal pain. Negative for abdominal distention.  Genitourinary: Negative for dysuria, enuresis and difficulty urinating.  Musculoskeletal: Negative for arthralgias.  Neurological: Positive for dizziness and syncope. Negative for light-headedness and headaches.  Psychiatric/Behavioral: Negative for confusion.    Allergies  Ceftriaxone and Hydrocodone  Home Medications   Current Outpatient Rx  Name Route Sig Dispense Refill  . ACETAMINOPHEN 325 MG PO TABS Oral Take 650 mg by mouth as needed.      . ALPRAZOLAM 1 MG PO TABS Oral Take 1 mg by mouth every 8 (eight) hours as needed. Anxiety    . CIPROFLOXACIN HCL 500 MG PO TABS Oral Take 500 mg by mouth 2 (two) times daily.    . DULOXETINE HCL 30 MG PO CPEP Oral Take 30 mg by mouth daily.    [name] GABAPENTIN 300 MG PO CAPS Oral Take 300 mg by mouth at bedtime.    . IBUPROFEN 200 MG PO TABS Oral Take 200 mg by mouth every 6 (six) hours as needed. pain    . LORAZEPAM 1 MG PO TABS Oral Take 1 mg by mouth 3 (three) times daily as needed. Anxiety    . PSEUDOEPHEDRINE-ACETAMINOPHEN 30-500 MG PO TABS Oral Take 1 tablet by mouth every 4 (four) hours as needed.    . SUNITINIB MALATE 50 MG PO CAPS  Oral Take 50 mg by mouth daily. Take 1 capsule by mouth daily for 28 days on, 14 days off.      BP 127/95  Pulse 92  Temp 98.1 F (36.7 C) (Axillary)  Resp 16  SpO2 100%  Physical Exam  Nursing note and vitals reviewed. Constitutional: He is oriented to person, place, and time. He appears well-developed.  HENT:  Head: Normocephalic and atraumatic.  Eyes: Conjunctivae normal and EOM are normal. Pupils are equal, round, and reactive to light.       Pupils are 3 and equal, no nystagmus  Neck: Normal range of motion. Neck supple.  Cardiovascular: Normal rate and regular rhythm.   Pulmonary/Chest: Effort normal and breath sounds normal.  Abdominal: Soft. Bowel sounds are normal. He exhibits no distension. There  is no tenderness. There is no rebound and no guarding.       Pt has external hemorrhoids - with no mucopurulent discharge, and no evidence of thrombosis. Rectal exam shows no boggy prostate, and again - no purulent discharge was expressed.  Neurological: He is alert and oriented to person, place, and time. No cranial nerve deficit. Coordination normal.       Marye Round was negative.  Skin: Skin is warm.    ED Course  Procedures (including critical care time)  Labs Reviewed  BASIC METABOLIC PANEL - Abnormal; Notable for the following:    Glucose, Bld 172 (*)     GFR calc non Af Amer 64 (*)     GFR calc Af Amer 75 (*)     All other components within normal limits  CBC WITH DIFFERENTIAL - Abnormal; Notable for the following:    RBC 4.12 (*)     MCV 106.8 (*)     MCH 35.7 (*)     Platelets 148 (*)     Neutrophils Relative 79 (*)     All other components within normal limits  MAGNESIUM   No results found.   No diagnosis found.    MDM  DDx for syncope includes: Orthostatic hypotension Vertebral artery dissection/stenosis Dysrhythmia PE Vasovagal/neurocardiogenic syncope Valvular disorder/Cardiomyopathy Anemia  DDx for vertigo includes: Central vertigo:  Tumor  Stroke  ICH  Vertebrobasilar TIA  Peripheral Vertigo:  BPPV  Vestibular neuritis  Meniere disease  Migrainous vertigo  Ear Infection  DDX for rectal pain: External hemorrhoids and it's complications Anal fissures Abscess Fistula  Pt comes in with cc of rectal pain - which appears to be due to external hemorhoids. No evidence of thrombosis, abscess. Pt is on cipro at this time, which would help with infection. Recommended PCP f/u along with the basic management, in case the hemorrhoid gets infected, thrombosed.  Pt also has been having vertigo, with a syncopal event. With hx of cancer, and some palpitations prior to the syncope - will get Ct PE. We will monitor him closely in the ED at least for 3-4  hours to see if any dysrhythmias can be illicited. We will also get basic syncope w/u and EKG.   Date: 10/15/2012  Rate: 74  Rhythm: normal sinus rhythm, PVC  QRS Axis: normal  Intervals: normal  ST/T Wave abnormalities: normal  Conduction Disutrbances: none  Narrative Interpretation: unremarkable   All the labs are negative thus far, and Ct PE is negative. SF syncope score is 0. Will discharge at this time.            Varney Biles, MD 10/15/12 2109

## 2012-10-15 NOTE — ED Notes (Signed)
Discharge instructions reviewed. Rx given X3. All questions answered.

## 2012-10-15 NOTE — ED Notes (Signed)
Patient reports that he was recently diagnosed with vertigo and had a syncopal episode this afternoon.

## 2012-10-18 ENCOUNTER — Ambulatory Visit (INDEPENDENT_AMBULATORY_CARE_PROVIDER_SITE_OTHER): Payer: BC Managed Care – PPO | Admitting: General Surgery

## 2012-10-18 ENCOUNTER — Encounter (INDEPENDENT_AMBULATORY_CARE_PROVIDER_SITE_OTHER): Payer: Self-pay | Admitting: General Surgery

## 2012-10-18 VITALS — BP 150/70 | HR 104 | Temp 97.4°F | Resp 28 | Ht 75.0 in | Wt 213.8 lb

## 2012-10-18 DIAGNOSIS — K612 Anorectal abscess: Secondary | ICD-10-CM

## 2012-10-18 DIAGNOSIS — K611 Rectal abscess: Secondary | ICD-10-CM

## 2012-10-18 MED ORDER — METRONIDAZOLE 500 MG PO TABS: 500.0000 mg | ORAL_TABLET | Freq: Three times a day (TID) | ORAL | Status: AC

## 2012-10-18 MED ORDER — OXYCODONE-ACETAMINOPHEN 5-325 MG PO TABS: 1.0000 | ORAL_TABLET | Freq: Four times a day (QID) | ORAL | Status: DC | PRN

## 2012-10-18 NOTE — Progress Notes (Signed)
The patient comes in with terrible perianal and perirectal pain. He is currently getting chemotherapy for metastatic renal cell carcinoma. He just arrived to our office from the [facility] where he had to have a catheter placed because of urinary retention.  This perirectal pain and perianal pain was seen in the emergency department however he was told that he was not infected. On arrival to our office in the posterior anal area there is significant firm indurated and very tender area associated with what appeared to be a thrombosed hemorrhoid just in the anal verge area. We prepped the area with Betadine and subsequently anesthetized with 2% Xylocaine with epinephrine. A 15 blade was used to make an incision into this area where a large amount of dark foul-smelling mucopurulent material was drained. After opening it up with a hemostat clamp and packed with approximately 2-1/2 feet of quarter-inch iodoform gauze. All place the patient on additional Flagyl therapy as he is already on ciprofloxacin.  His return to office in 2 days to have the dressing packing removed. We'll also prescribe him some Percocet tablets for pain control.

## 2012-10-20 ENCOUNTER — Ambulatory Visit (INDEPENDENT_AMBULATORY_CARE_PROVIDER_SITE_OTHER): Payer: BC Managed Care – PPO | Admitting: General Surgery

## 2012-10-20 ENCOUNTER — Encounter (INDEPENDENT_AMBULATORY_CARE_PROVIDER_SITE_OTHER): Payer: Self-pay | Admitting: General Surgery

## 2012-10-20 VITALS — BP 128/74 | HR 65 | Temp 98.0°F | Resp 18 | Ht 75.0 in | Wt 217.0 lb

## 2012-10-20 DIAGNOSIS — K611 Rectal abscess: Secondary | ICD-10-CM

## 2012-10-20 DIAGNOSIS — K612 Anorectal abscess: Secondary | ICD-10-CM

## 2012-10-20 NOTE — Progress Notes (Signed)
Keith [name] is a 46 y.o. male who is here for a follow up visit regarding drainage of a perirectal abscess on Monday.  He states that his pain is about the same.  He denies any fevers.  He is having bowel movements.    Objective: Filed Vitals:   10/20/12 1504  BP: 128/74  Pulse: 65  Temp: 98 F (36.7 C)  Resp: 18    General appearance: alert and cooperative abscess cavity with packing and purulence   Assessment and Plan: Packing removed Sitz baths TID F/u in 1 week    .Rosario Adie, MD [facility], [place]

## 2012-10-20 NOTE — Patient Instructions (Addendum)
Home Instructions Following Incision and Drainage of Perirectal Abscess  Wound care - A dressing has been applied to control any drainage after your procedure.  You may remove this dressing at your first bowel movement or bath, whichever comes first.   You do not need to repack the area.  After the dressing is removed, clean the area gently with a mild soap and warm water and place a piece of 100% cotton over the area.  Change to cotton ever 1-3 hours while awake to keep the area clean and dry.   - Beginning this evening, sit in a tub of warm water for 15-20 minutes at least 3 times a day and after bowel movements.  This will help with healing, pain and discomfort. - A small amount of bleeding is to be expected.  If you notice an increase in the bleeding, place a large piece of cotton (about the size of a golf ball) next to the anal opening and sit on a hard surface for 15 minutes.  If the bleeding persists or if you are concerned, please call the office.  Do not sit on rubber rings.  Instead, sit on a soft pillow.    Diet -Eat a regular diet.  Avoid foods that may constipate you or give you diarrhea.  Drink 6-8 glasses of water a day and avoid seeds, nuts and popcorn until the area heals.  Medication -Take pain medication as directed.  Do not drive or operate machinery if you are taking a prescription pain medication.   - We recommend Extra Strength Tylenol for mild to moderate pain.  This can be taken as instructed on the bottle.   - If you are given a prescription for antibiotics, take as instructed by your doctor until the entire course is completed  Bowel Habits Avoid laxatives unless instructed by your doctor. Take a fiber supplement twice a day (Metamucil, FiberCon, Benefiber) Avoid excessive straining to have a bowel movement Do not go for more than 3 days without a bowel movement.  Take a regular Fleet enema if you are constipated.  Call the office if unable to do this or no results.     Activity Resume activities as tolerated beginning tomorrow.  Avoid strenuous activities or sports for one week.    Call the office if you have any questions.  Call IMMEDIATELY if you should develop persistent heavy rectal bleeding, increase in pain, difficulty urinating or fever greater than 100 F.

## 2012-10-27 ENCOUNTER — Encounter (INDEPENDENT_AMBULATORY_CARE_PROVIDER_SITE_OTHER): Payer: Self-pay

## 2012-10-27 ENCOUNTER — Encounter (INDEPENDENT_AMBULATORY_CARE_PROVIDER_SITE_OTHER): Payer: Self-pay | Admitting: General Surgery

## 2012-10-27 ENCOUNTER — Ambulatory Visit (INDEPENDENT_AMBULATORY_CARE_PROVIDER_SITE_OTHER): Payer: BC Managed Care – PPO | Admitting: General Surgery

## 2012-10-27 VITALS — BP 128/78 | HR 80 | Temp 97.2°F | Resp 24 | Ht 75.0 in | Wt 216.4 lb

## 2012-10-27 DIAGNOSIS — K611 Rectal abscess: Secondary | ICD-10-CM

## 2012-10-27 DIAGNOSIS — K612 Anorectal abscess: Secondary | ICD-10-CM

## 2012-10-27 NOTE — Patient Instructions (Signed)
Continue to hold any chemotherapy for 1 more week, then you can resume treatments.  Ok to go back to work.

## 2012-10-27 NOTE — Progress Notes (Signed)
Keith [name] is a 46 y.o. male who is here for a follow up visit regarding follow up for his anorectal abscess.  He is doing better.  His pain is getting better.  He complains of some itching.  He is having regular bowel movements.    Objective: Filed Vitals:   10/27/12 1509  BP: 128/78  Pulse: 80  Temp: 97.2 F (36.2 C)  Resp: 24    General appearance: alert and cooperative rectal: slight pain to palpation, no evidence of infection, DRE reveals no palpable fluctuance   Assessment and Plan: Resolving perirectal abscess.  No current signs of residual infection or fistula.  Ok to return to work.  Could start chemo again in 1 week.    Rosario Adie, [facility], [place]

## 2012-10-30 IMAGING — CT CT CHEST W/ CM
2 of 5 series · 11 of 36 positions shown, 17 images · IV contrast (READICAT/WATER & [ID] OMNI 300)
Comparison: Chest CT 10/14/2010.  Abdominal pelvic CTs most recent
03/29/2010.

CT CHEST

CLINICAL DATA: Left nephrectomy [DATE] for renal cell carcinoma.
Right-sided lung cancer [DATE].  Surgery and radiation therapy.
Ongoing chemotherapy.  Ex-smoker.  No complaints.

CT CHEST, ABDOMEN AND PELVIS WITH CONTRAST
TECHNIQUE: Contiguous axial images of the chest abdomen and pelvis
were obtained after IV contrast administration.
Contrast: 100  ml [id number]

[Series 601: coronal body · coronal · 1.35mm/px · 1 of 118 slices shown, 2 images]
[im 40/118  soft-tissue]
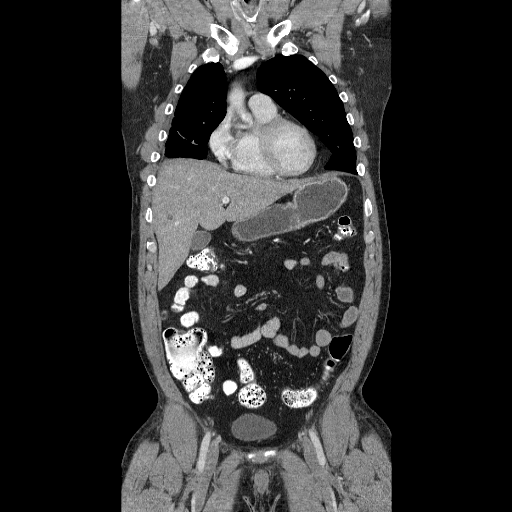
[im 40/118  bone]
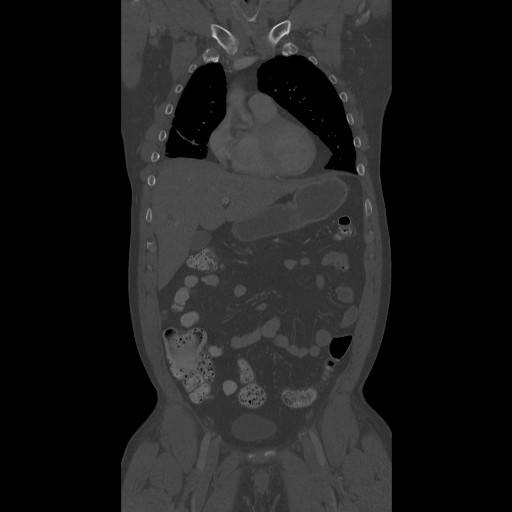

[Series 602: sagittal body · sagittal · 1.35mm/px · 10 of 153 slices shown, 15 images]
[im 13/153  soft-tissue]
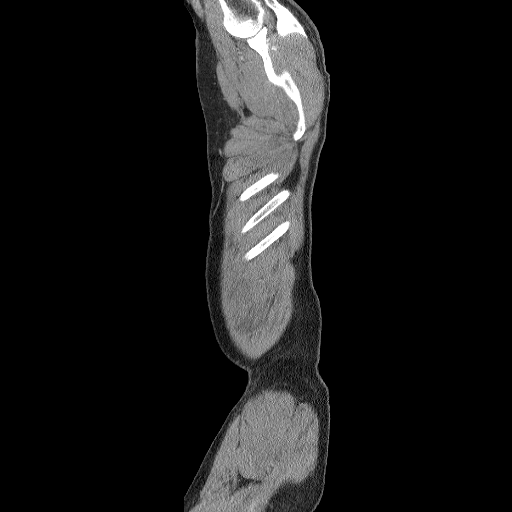
[im 13/153  lung]
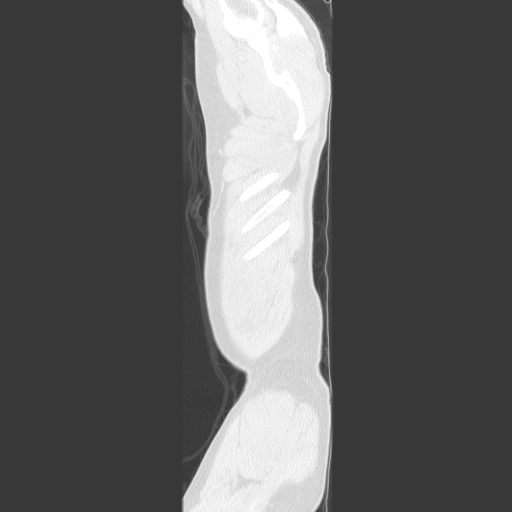
[im 13/153  bone]
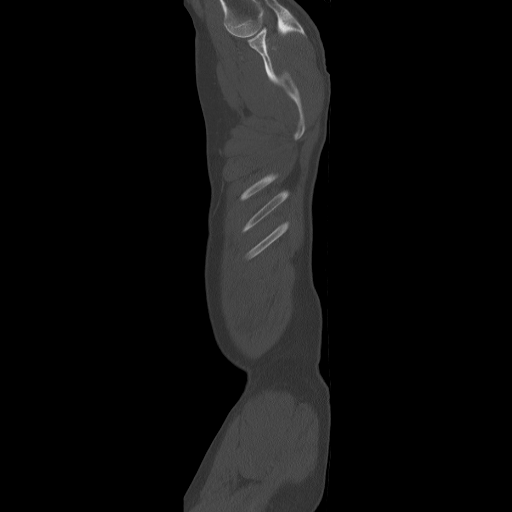
[im 26/153  soft-tissue]
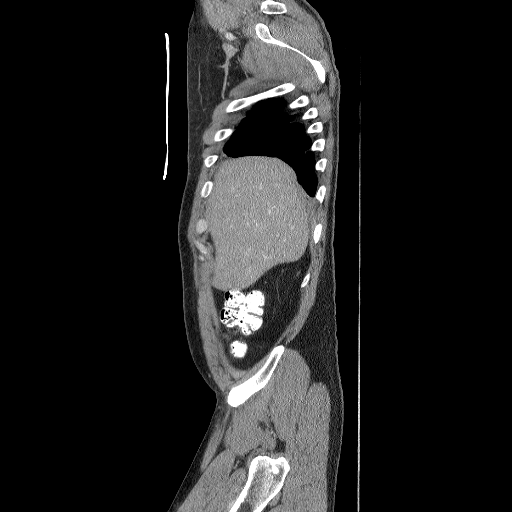
[im 26/153  lung]
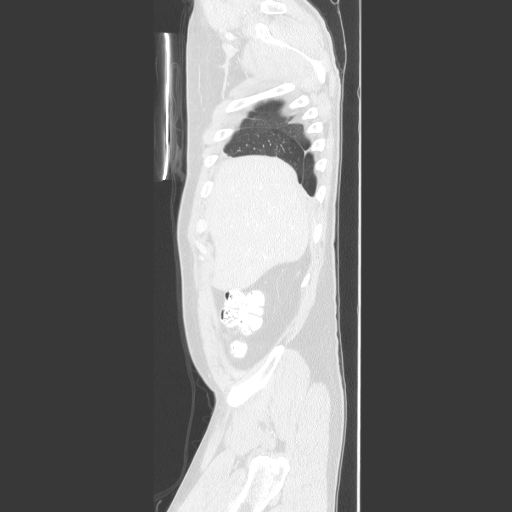
[im 39/153  lung]
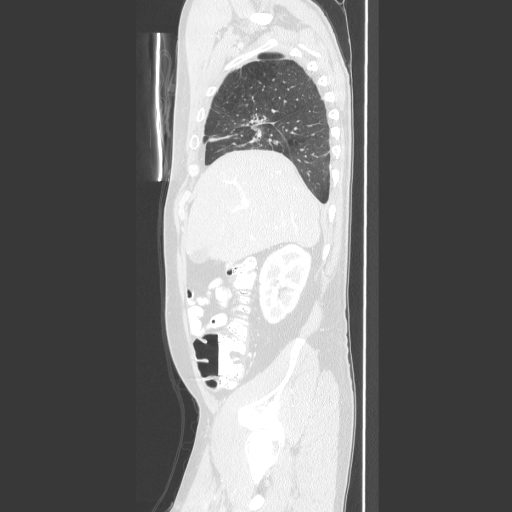
[im 51/153  soft-tissue]
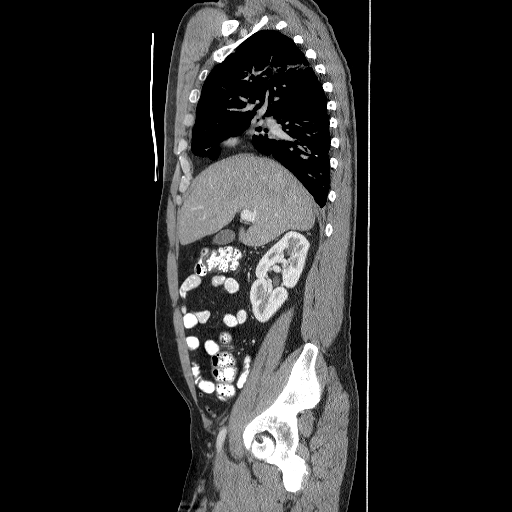
[im 51/153  lung]
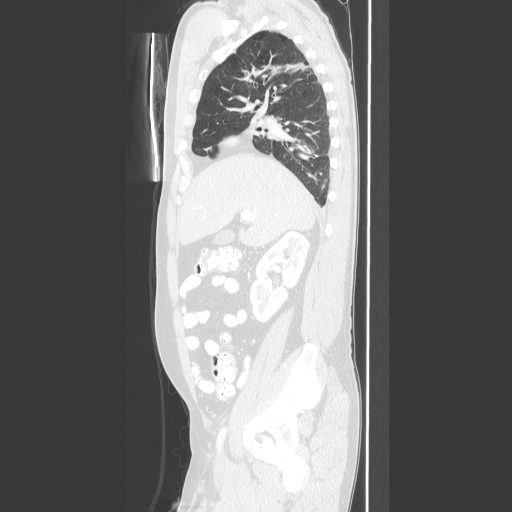
[im 64/153  soft-tissue]
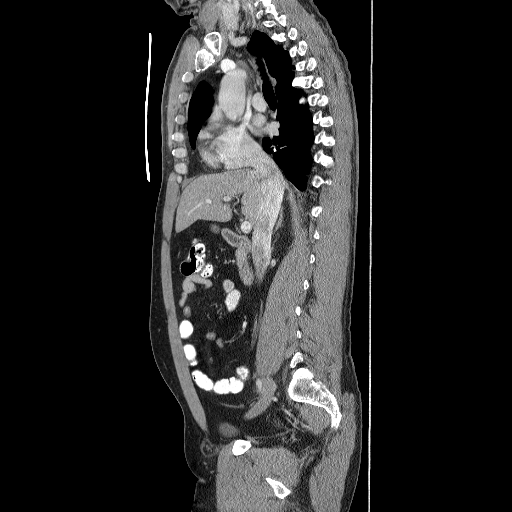
[im 77/153  soft-tissue]
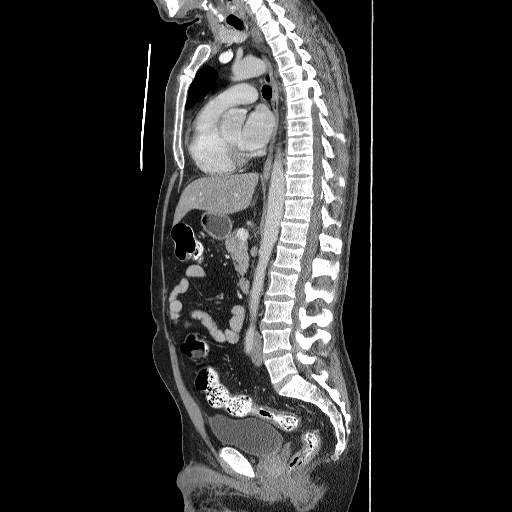
[im 89/153  soft-tissue]
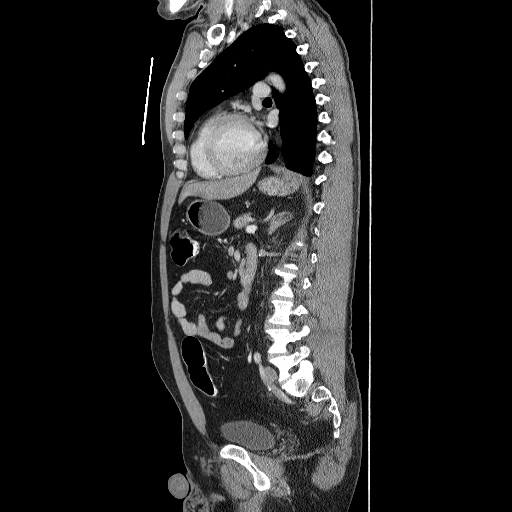
[im 102/153  soft-tissue]
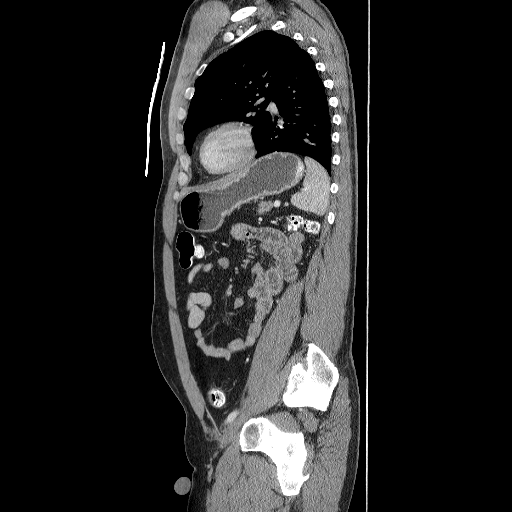
[im 127/153  soft-tissue]
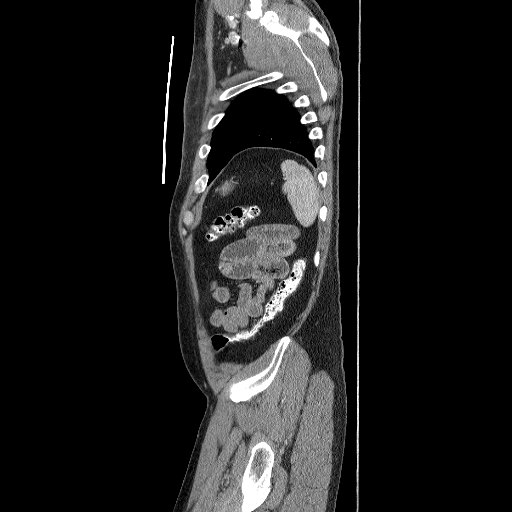
[im 140/153  soft-tissue]
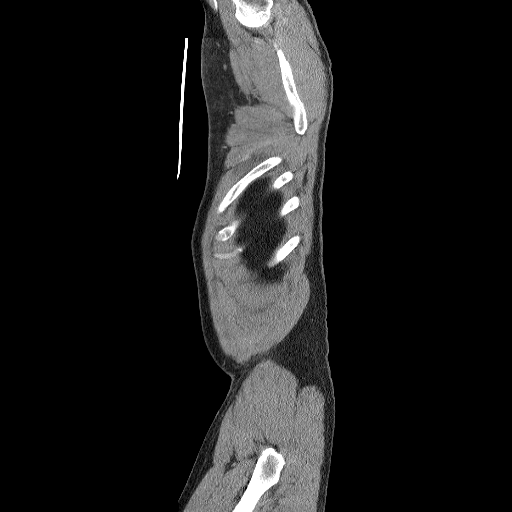
[im 140/153  bone]
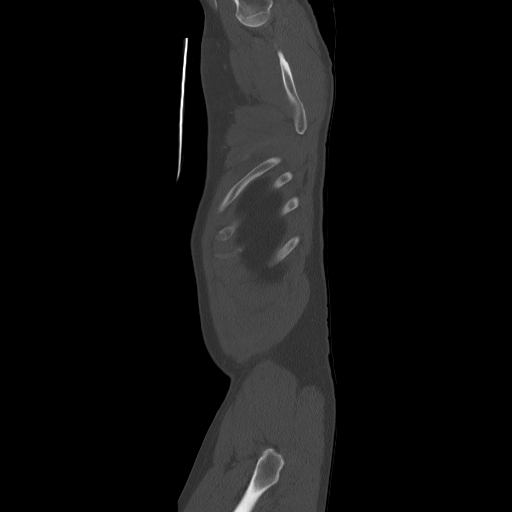

[11 of 36 positions shown; findings below may reference images not displayed]

FINDINGS: Lung windows demonstrate mild centrilobular emphysema.
Probable atelectasis at the inferior lingula on image 42.
Multifocal radiation change within the right lung.  This is
slightly decreased.  The 9 mm nodule described in the right lower
lobe on the prior exam is less apparent today and measures 6 mm on
image 36.  A smaller right lower lobe nodule on image 41 of the
prior exam is not apparent today.  Similarly, 7 mm left upper lobe
nodule described on the prior is not appreciated today.

Soft tissue windows demonstrate normal heart size.  Trace anterior
pericardial fluid is likely physiologic.  No pleural effusion.
There is mild pleural thickening on the right. No mediastinal or
hilar adenopathy.
IMPRESSION: 1.  Decreased radiation change in the right lung.
2.  Decrease size of pulmonary metastasis.

CT ABDOMEN AND PELVIS
FINDINGS: Mild hepatic steatosis.  A too small to characterize
right hepatic lobe lesion on image 60 of series 3 is similar back
to 03/29/2010.

Normal spleen, stomach.

On the 03/29/2010 abdominal CT, mild pancreatic ductal dilatation
was described with a questionable area of soft tissue fullness in
the pancreatic head, uncinate process.  Today, there has been
development of relative pancreatic atrophy involving the body and
tail.  The ductal dilatation described on the prior exam has
resolved.  There is equivocal soft tissue fullness remaining within
the pancreatic head on image 68 of series 3.  There  may have been
peripancreatic edema on the intervening chest CTs, including
10/14/2010.

Normal gallbladder and biliary tract.  Normal adrenal glands.  Left
nephrectomy without locally recurrent disease.  Normal right
kidney.

No retroperitoneal or retrocrural adenopathy.

Normal colon, appendix, and terminal ileum.  Normal small bowel
without abdominal ascites.

  No pelvic adenopathy.    Normal urinary bladder and prostate.  No
significant free fluid.  Stable bone island in the right iliac
wing.  Degenerative partial fusion of the right sacroiliac joint.
IMPRESSION: 1. No acute process or evidence of metastatic disease in the
abdomen or pelvis.
2.  Altered appearance  of the pancreas over numerous exams since
03/29/2010.  On the 03/29/2010 exam, pancreatic ductal dilatation
continued to the level of the pancreatic head.  On intervening
chest CTs, incompletely imaged probable pancreatitis identified.
Currently, pancreatic body and tail atrophy are seen.  Favored
explanation is duct obstruction on 03/29/2010 secondary to either a
stone or a metastasis with now resolved pancreatitis ( possibly
secondary to treatment of a pancreatic metastasis).  The
pancreatic atrophy is secondary to the intervening pancreatitis.
Differential considerations would include resolved non metastatic
pancreatic duct stricture or less likely a pancreatic head
adenocarcinoma with resolved pancreatitis.  Carcinoma is felt
unlikely, given the resolution of the previous described ductal
dilatation.  If further imaging evaluation is desired, pancreatic
protocol CT or pre and post contrast abdominal MRI should be
considered.
3.  Mild hepatic steatosis.

## 2012-11-12 ENCOUNTER — Telehealth: Payer: Self-pay | Admitting: Oncology

## 2012-11-12 ENCOUNTER — Other Ambulatory Visit (HOSPITAL_BASED_OUTPATIENT_CLINIC_OR_DEPARTMENT_OTHER): Payer: BC Managed Care – PPO | Admitting: Lab

## 2012-11-12 ENCOUNTER — Ambulatory Visit (HOSPITAL_BASED_OUTPATIENT_CLINIC_OR_DEPARTMENT_OTHER): Payer: BC Managed Care – PPO | Admitting: Oncology

## 2012-11-12 VITALS — BP 122/83 | HR 57 | Temp 97.0°F | Resp 18 | Ht 75.0 in | Wt 224.2 lb

## 2012-11-12 DIAGNOSIS — F341 Dysthymic disorder: Secondary | ICD-10-CM

## 2012-11-12 DIAGNOSIS — C7800 Secondary malignant neoplasm of unspecified lung: Secondary | ICD-10-CM

## 2012-11-12 DIAGNOSIS — C649 Malignant neoplasm of unspecified kidney, except renal pelvis: Secondary | ICD-10-CM

## 2012-11-12 DIAGNOSIS — C7889 Secondary malignant neoplasm of other digestive organs: Secondary | ICD-10-CM

## 2012-11-12 LAB — CBC WITH DIFFERENTIAL/PLATELET
BASO%: 0.7 % (ref 0.0–2.0)
Basophils Absolute: 0.1 10*3/uL (ref 0.0–0.1)
EOS%: 3.5 % (ref 0.0–7.0)
Eosinophils Absolute: 0.3 10*3/uL (ref 0.0–0.5)
HCT: 40.6 % (ref 38.4–49.9)
HGB: 14.2 g/dL (ref 13.0–17.1)
LYMPH%: 22.4 % (ref 14.0–49.0)
MCH: 37.2 pg — ABNORMAL HIGH (ref 27.2–33.4)
MCHC: 35 g/dL (ref 32.0–36.0)
MCV: 106.2 fL — ABNORMAL HIGH (ref 79.3–98.0)
MONO#: 0.8 10*3/uL (ref 0.1–0.9)
MONO%: 10.6 % (ref 0.0–14.0)
NEUT#: 4.8 10*3/uL (ref 1.5–6.5)
NEUT%: 62.8 % (ref 39.0–75.0)
Platelets: 166 10*3/uL (ref 140–400)
RBC: 3.82 10*6/uL — ABNORMAL LOW (ref 4.20–5.82)
RDW: 13.9 % (ref 11.0–14.6)
WBC: 7.7 10*3/uL (ref 4.0–10.3)
lymph#: 1.7 10*3/uL (ref 0.9–3.3)

## 2012-11-12 LAB — COMPREHENSIVE METABOLIC PANEL (CC13)
ALT: 185 U/L — ABNORMAL HIGH (ref 0–55)
AST: 82 U/L — ABNORMAL HIGH (ref 5–34)
Albumin: 3.8 g/dL (ref 3.5–5.0)
Alkaline Phosphatase: 91 U/L (ref 40–150)
BUN: 19 mg/dL (ref 7.0–26.0)
CO2: 28 mEq/L (ref 22–29)
Calcium: 9.6 mg/dL (ref 8.4–10.4)
Chloride: 104 mEq/L (ref 98–107)
Creatinine: 1.4 mg/dL — ABNORMAL HIGH (ref 0.7–1.3)
Glucose: 100 mg/dl — ABNORMAL HIGH (ref 70–99)
Potassium: 4.8 mEq/L (ref 3.5–5.1)
Sodium: 138 mEq/L (ref 136–145)
Total Bilirubin: 0.36 mg/dL (ref 0.20–1.20)
Total Protein: 7.2 g/dL (ref 6.4–8.3)

## 2012-11-12 MED ORDER — ALPRAZOLAM 1 MG PO TABS: 1.0000 mg | ORAL_TABLET | Freq: Three times a day (TID) | ORAL | Status: DC | PRN

## 2012-11-12 NOTE — Telephone Encounter (Signed)
appts made and printed for pt, pt aware that cen/sch will call with scan appt

## 2012-11-12 NOTE — Progress Notes (Signed)
Hematology and Oncology Follow Up Visit  Keith [name] US:[id number] 1966-04-01 46 y.o. 11/12/2012 9:04 AM  [name], MD  [name], M.D.  [name], MD  [name], MD    Principle Diagnosis: This is a 46 year old gentleman with stage IV renal cell carcinoma diagnosed in 2009.  Prior Therapy: 1. Status post laparoscopic radical nephrectomy.  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 2. Patient status post thoracotomy done October 2009.  He had a lower lobe nodule, biopsy proven to be metastatic renal cell carcinoma.   3. Patient is status post stereotactic radiotherapy to pulmonary nodules in May of 2010.  Current therapy: He is on Sutent 50 mg 4 weeks on 2 weeks off since November of 2011, has stable disease. His disease include pancreatic involvement with RCC.  Interim History:  Mr. [name] presents today for a followup visit.  He has tolerated Sutent very well but he stopped it for the last month due to multiple issues. He was diagnosed with thrombosed hemorrhoids. He is S/P surgical removal on 10/28. He was also found to be in urinary retention that has resolved now. He is feeling a lot better. He is no longer on antibiotics at this time.     He is not reporting any chest pain at this time.He is not reporting any abdominal pain.  He did report grade 1-2 hand-foot syndrome. His pain in the his feet as well as the ankles. Has not reported any other complications.His mood is much improved on Cymbalta. He is still working full time. He reports foot pain at times but still able to work.  Medications: I have reviewed the patient's current medications. Current outpatient prescriptions:acetaminophen (TYLENOL) 325 MG tablet, Take 650 mg by mouth as needed.  , Disp: , Rfl: ;  ALPRAZolam (XANAX) 1 MG tablet, Take 1 tablet (1 mg total) by mouth every 8 (eight) hours as needed. Anxiety, Disp: 30 tablet, Rfl: 1;  DULoxetine (CYMBALTA) 30 MG capsule, Take 30 mg by  mouth daily., Disp: , Rfl: ;  gabapentin (NEURONTIN) 300 MG capsule, Take 300 mg by mouth at bedtime., Disp: , Rfl:  ibuprofen (ADVIL,MOTRIN) 200 MG tablet, Take 200 mg by mouth every 6 (six) hours as needed. pain, Disp: , Rfl: ;  LORazepam (ATIVAN) 1 MG tablet, Take 1 mg by mouth 3 (three) times daily as needed. Anxiety, Disp: , Rfl: ;  meclizine (ANTIVERT) 25 MG tablet, , Disp: , Rfl: ;  oxyCODONE-acetaminophen (ROXICET) 5-325 MG per tablet, Take 1 tablet by mouth every 6 (six) hours as needed for pain., Disp: 30 tablet, Rfl: 0 pseudoephedrine-acetaminophen (TYLENOL SINUS) 30-500 MG TABS, Take 1 tablet by mouth every 4 (four) hours as needed., Disp: , Rfl: ;  Tamsulosin HCl (FLOMAX) 0.4 MG CAPS, Take 0.4 mg by mouth Daily., Disp: , Rfl: ;  [DISCONTINUED] ALPRAZolam (XANAX) 1 MG tablet, Take 1 mg by mouth every 8 (eight) hours as needed. Anxiety, Disp: , Rfl:   Allergies:  Allergies  Allergen Reactions  . Ceftriaxone Hives  . Hydrocodone Swelling    Past Medical History, Surgical history, Social history, and Family History were reviewed and updated.  Review of Systems: Constitutional:  Negative for fever, chills, night sweats, anorexia, weight loss, pain. Cardiovascular: no chest pain or dyspnea on exertion Respiratory: no cough, shortness of breath, or wheezing Neurological: no TIA or stroke symptoms Dermatological: negative ENT: negative Skin: Negative. Gastrointestinal: no abdominal pain, change in bowel habits, or black or bloody stools Genito-Urinary:  no dysuria, trouble voiding, or hematuria Hematological and Lymphatic: negative Breast: negative Musculoskeletal: negative for - joint swelling, muscle pain or pain in foot - bilateral Remaining ROS negative.  Physical Exam: Blood pressure 122/83, pulse 57, temperature 97 F (36.1 C), temperature source Oral, resp. rate 18, height 6\' 3"  (1.905 m), weight 224 lb 3.2 oz (101.696 kg). ECOG: 0 General appearance: alert Head:  Normocephalic, without obvious abnormality, atraumatic Neck: no adenopathy, no carotid bruit, no JVD, supple, symmetrical, trachea midline and thyroid not enlarged, symmetric, no tenderness/mass/nodules Lymph nodes: Cervical, supraclavicular, and axillary nodes normal. Heart:regular rate and rhythm, S1, S2 normal, no murmur, click, rub or gallop Lung:chest clear, no wheezing, rales, normal symmetric air entry. Chest wall pain is reproducible with pressure. Abdomen: soft, non-tender, without masses or organomegaly EXT:no edema, no despumation. No erythema noted. Tender to touch at the ankle and shin area on both sides.    Lab Results: Lab Results  Component Value Date   WBC 7.7 11/12/2012   HGB 14.2 11/12/2012   HCT 40.6 11/12/2012   MCV 106.2* 11/12/2012   PLT 166 11/12/2012     Chemistry      Component Value Date/Time   NA 136 10/15/2012 1831   NA 140 10/08/2012 1042   NA 139 04/30/2012 1134   K 4.9 10/15/2012 1831   K 4.8 10/08/2012 1042   K 4.3 04/30/2012 1134   CL 98 10/15/2012 1831   CL 105 10/08/2012 1042   CL 96* 04/30/2012 1134   CO2 29 10/15/2012 1831   CO2 28 10/08/2012 1042   CO2 29 04/30/2012 1134   BUN 18 10/15/2012 1831   BUN 14.0 10/08/2012 1042   BUN 14 04/30/2012 1134   CREATININE 1.31 10/15/2012 1831   CREATININE 1.3 10/08/2012 1042   CREATININE 1.4* 04/30/2012 1134      Component Value Date/Time   CALCIUM 10.0 10/15/2012 1831   CALCIUM 9.6 10/08/2012 1042   CALCIUM 8.5 04/30/2012 1134   ALKPHOS 98 10/08/2012 1042   ALKPHOS 77 07/16/2012 1122   ALKPHOS 93* 04/30/2012 1134   AST 29 10/08/2012 1042   AST 21 07/16/2012 1122   AST 36 04/30/2012 1134   ALT 43 10/08/2012 1042   ALT 34 07/16/2012 1122   BILITOT 0.60 10/08/2012 1042   BILITOT 0.4 07/16/2012 1122   BILITOT 0.70 04/30/2012 1134      Impression and Plan:  This is a pleasant 46 year old gentleman with the following issues. 1. Metastatic renal cell carcinoma.  He has documented disease to the lung  and the pancreas.  He tolerating Sutent well. He does have low grade hand-foot syndrome. The plan is to restart Sutent with current dose and schedule. CT scan from 08/2012 showed stable disease and we will repeat on 11/2012.  2. Pancreatic thickening/mass.  This has been biopsy proven to be a renal cell metastasis. No change in treatment at this time. 3. Depression/Anxiety: mood much improved on Cymbalta and Xanax.  4. Foot/lower legs  pain: unclear etiology. Stable now. 5. Follow up: 4 weeks.  Alorah Mcree 11/22/20139:04 AM

## 2012-11-23 ENCOUNTER — Encounter: Payer: Self-pay | Admitting: *Deleted

## 2012-11-23 NOTE — Progress Notes (Signed)
RECEIVED A FAX FROM [facility] CONCERNING A CONFIRMATION OF PRESCRIPTION SHIPMENT FOR SUTENT ON 11/22/12.

## 2012-12-03 ENCOUNTER — Ambulatory Visit (HOSPITAL_COMMUNITY)
Admission: RE | Admit: 2012-12-03 | Discharge: 2012-12-03 | Disposition: A | Discharge status: Home / Self Care | Payer: BC Managed Care – PPO | Source: Ambulatory Visit | Attending: Oncology | Admitting: Oncology

## 2012-12-03 ENCOUNTER — Other Ambulatory Visit (HOSPITAL_BASED_OUTPATIENT_CLINIC_OR_DEPARTMENT_OTHER): Payer: BC Managed Care – PPO | Admitting: Lab

## 2012-12-03 DIAGNOSIS — N289 Disorder of kidney and ureter, unspecified: Secondary | ICD-10-CM | POA: Insufficient documentation

## 2012-12-03 DIAGNOSIS — K8689 Other specified diseases of pancreas: Secondary | ICD-10-CM | POA: Insufficient documentation

## 2012-12-03 DIAGNOSIS — K7689 Other specified diseases of liver: Secondary | ICD-10-CM | POA: Insufficient documentation

## 2012-12-03 DIAGNOSIS — Z905 Acquired absence of kidney: Secondary | ICD-10-CM | POA: Insufficient documentation

## 2012-12-03 DIAGNOSIS — C649 Malignant neoplasm of unspecified kidney, except renal pelvis: Secondary | ICD-10-CM

## 2012-12-03 DIAGNOSIS — J479 Bronchiectasis, uncomplicated: Secondary | ICD-10-CM | POA: Insufficient documentation

## 2012-12-03 LAB — CBC WITH DIFFERENTIAL/PLATELET
BASO%: 0.4 % (ref 0.0–2.0)
Basophils Absolute: 0 10*3/uL (ref 0.0–0.1)
EOS%: 3.6 % (ref 0.0–7.0)
Eosinophils Absolute: 0.2 10*3/uL (ref 0.0–0.5)
HCT: 46.4 % (ref 38.4–49.9)
HGB: 15.9 g/dL (ref 13.0–17.1)
LYMPH%: 32.9 % (ref 14.0–49.0)
MCH: 36.2 pg — ABNORMAL HIGH (ref 27.2–33.4)
MCHC: 34.4 g/dL (ref 32.0–36.0)
MCV: 105.5 fL — ABNORMAL HIGH (ref 79.3–98.0)
MONO#: 0.2 10*3/uL (ref 0.1–0.9)
MONO%: 5.5 % (ref 0.0–14.0)
NEUT#: 2.5 10*3/uL (ref 1.5–6.5)
NEUT%: 57.6 % (ref 39.0–75.0)
Platelets: 132 10*3/uL — ABNORMAL LOW (ref 140–400)
RBC: 4.39 10*6/uL (ref 4.20–5.82)
RDW: 14.6 % (ref 11.0–14.6)
WBC: 4.4 10*3/uL (ref 4.0–10.3)
lymph#: 1.4 10*3/uL (ref 0.9–3.3)

## 2012-12-03 LAB — COMPREHENSIVE METABOLIC PANEL (CC13)
ALT: 68 U/L — ABNORMAL HIGH (ref 0–55)
AST: 34 U/L (ref 5–34)
Albumin: 3.9 g/dL (ref 3.5–5.0)
Alkaline Phosphatase: 106 U/L (ref 40–150)
BUN: 17 mg/dL (ref 7.0–26.0)
CO2: 26 mEq/L (ref 22–29)
Calcium: 9.4 mg/dL (ref 8.4–10.4)
Chloride: 105 mEq/L (ref 98–107)
Creatinine: 1.4 mg/dL — ABNORMAL HIGH (ref 0.7–1.3)
Glucose: 106 mg/dl — ABNORMAL HIGH (ref 70–99)
Potassium: 4.9 mEq/L (ref 3.5–5.1)
Sodium: 139 mEq/L (ref 136–145)
Total Bilirubin: 0.52 mg/dL (ref 0.20–1.20)
Total Protein: 7.7 g/dL (ref 6.4–8.3)

## 2012-12-03 MED ORDER — IOHEXOL 300 MG/ML  SOLN
100.0000 mL | Freq: Once | INTRAMUSCULAR | Status: AC | PRN
  Administered 2012-12-03: 100 mL via INTRAVENOUS

## 2012-12-10 ENCOUNTER — Telehealth: Payer: Self-pay | Admitting: Oncology

## 2012-12-10 ENCOUNTER — Ambulatory Visit (HOSPITAL_BASED_OUTPATIENT_CLINIC_OR_DEPARTMENT_OTHER): Payer: BC Managed Care – PPO | Admitting: Oncology

## 2012-12-10 VITALS — BP 138/91 | HR 95 | Temp 97.4°F | Resp 18 | Ht 75.0 in | Wt 229.7 lb

## 2012-12-10 DIAGNOSIS — C649 Malignant neoplasm of unspecified kidney, except renal pelvis: Secondary | ICD-10-CM

## 2012-12-10 DIAGNOSIS — C7889 Secondary malignant neoplasm of other digestive organs: Secondary | ICD-10-CM

## 2012-12-10 DIAGNOSIS — F341 Dysthymic disorder: Secondary | ICD-10-CM

## 2012-12-10 DIAGNOSIS — C7800 Secondary malignant neoplasm of unspecified lung: Secondary | ICD-10-CM

## 2012-12-10 NOTE — Telephone Encounter (Signed)
appts made and printed for pt aom °

## 2012-12-10 NOTE — Progress Notes (Signed)
Hematology and Oncology Follow Up Visit  Keith [name] US:[id number] 06/15/1966 46 y.o. 12/10/2012 3:37 PM  [name], MD  [name], M.D.  [name], MD  [name], MD    Principle Diagnosis: This is a 46 year old gentleman with stage IV renal cell carcinoma diagnosed in 2009.  Prior Therapy: 1. Status post laparoscopic radical nephrectomy.  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 2. Patient status post thoracotomy done October 2009.  He had a lower lobe nodule, biopsy proven to be metastatic renal cell carcinoma.   3. Patient is status post stereotactic radiotherapy to pulmonary nodules in May of 2010.  Current therapy: He is on Sutent 50 mg 4 weeks on 2 weeks off since November of 2011, has stable disease. His disease include pancreatic involvement with RCC.  Interim History:  Keith [name] presents today for a followup visit.  He has tolerated Sutent very well without any complications this month.  He is not reporting any chest pain at this time. He is not reporting any abdominal pain. He did report grade 1-2 hand-foot syndrome. His pain in the his feet as well as the ankles. Has not reported any other complications. He is no longer taking Cymbalta. He is still working full time. He reports foot pain at times but still able to work.  Medications: I have reviewed the patient's current medications. Current outpatient prescriptions:acetaminophen (TYLENOL) 325 MG tablet, Take 650 mg by mouth as needed.  , Disp: , Rfl: ;  ALPRAZolam (XANAX) 1 MG tablet, Take 1 tablet (1 mg total) by mouth every 8 (eight) hours as needed. Anxiety, Disp: 30 tablet, Rfl: 1;  DULoxetine (CYMBALTA) 30 MG capsule, Take 30 mg by mouth daily., Disp: , Rfl: ;  gabapentin (NEURONTIN) 300 MG capsule, Take 300 mg by mouth at bedtime., Disp: , Rfl:  ibuprofen (ADVIL,MOTRIN) 200 MG tablet, Take 200 mg by mouth every 6 (six) hours as needed. pain, Disp: , Rfl: ;  LORazepam (ATIVAN) 1 MG  tablet, Take 1 mg by mouth 3 (three) times daily as needed. Anxiety, Disp: , Rfl: ;  meclizine (ANTIVERT) 25 MG tablet, , Disp: , Rfl: ;  oxyCODONE-acetaminophen (ROXICET) 5-325 MG per tablet, Take 1 tablet by mouth every 6 (six) hours as needed for pain., Disp: 30 tablet, Rfl: 0 pseudoephedrine-acetaminophen (TYLENOL SINUS) 30-500 MG TABS, Take 1 tablet by mouth every 4 (four) hours as needed., Disp: , Rfl: ;  SUTENT 50 MG capsule, Take 50 mg by mouth. Take 1 capsule daily X 28 days; off 14 days; restart, Disp: , Rfl: ;  Tamsulosin HCl (FLOMAX) 0.4 MG CAPS, Take 0.4 mg by mouth Daily., Disp: , Rfl:   Allergies:  Allergies  Allergen Reactions  . Ceftriaxone Hives  . Hydrocodone Swelling    Past Medical History, Surgical history, Social history, and Family History were reviewed and updated.  Review of Systems: Constitutional:  Negative for fever, chills, night sweats, anorexia, weight loss, pain. Cardiovascular: no chest pain or dyspnea on exertion Respiratory: no cough, shortness of breath, or wheezing Neurological: no TIA or stroke symptoms Dermatological: negative ENT: negative Skin: Negative. Gastrointestinal: no abdominal pain, change in bowel habits, or black or bloody stools Genito-Urinary: no dysuria, trouble voiding, or hematuria Hematological and Lymphatic: negative Breast: negative Musculoskeletal: negative for - joint swelling, muscle pain or pain in foot - bilateral Remaining ROS negative.  Physical Exam: Blood pressure 138/91, pulse 95, temperature 97.4 F (36.3 C), temperature source Oral, resp. rate 18, height  6\' 3"  (1.905 m), weight 229 lb 11.2 oz (104.191 kg). ECOG: 0 General appearance: alert Head: Normocephalic, without obvious abnormality, atraumatic Neck: no adenopathy, no carotid bruit, no JVD, supple, symmetrical, trachea midline and thyroid not enlarged, symmetric, no tenderness/mass/nodules Lymph nodes: Cervical, supraclavicular, and axillary nodes  normal. Heart:regular rate and rhythm, S1, S2 normal, no murmur, click, rub or gallop Lung:chest clear, no wheezing, rales, normal symmetric air entry. Chest wall pain is reproducible with pressure. Abdomen: soft, non-tender, without masses or organomegaly EXT:no edema, no despumation. No erythema noted. Tender to touch at the ankle and shin area on both sides.    Lab Results: Lab Results  Component Value Date   WBC 4.4 12/03/2012   HGB 15.9 12/03/2012   HCT 46.4 12/03/2012   MCV 105.5* 12/03/2012   PLT 132* 12/03/2012     Chemistry      Component Value Date/Time   NA 139 12/03/2012 0829   NA 136 10/15/2012 1831   NA 139 04/30/2012 1134   K 4.9 12/03/2012 0829   K 4.9 10/15/2012 1831   K 4.3 04/30/2012 1134   CL 105 12/03/2012 0829   CL 98 10/15/2012 1831   CL 96* 04/30/2012 1134   CO2 26 12/03/2012 0829   CO2 29 10/15/2012 1831   CO2 29 04/30/2012 1134   BUN 17.0 12/03/2012 0829   BUN 18 10/15/2012 1831   BUN 14 04/30/2012 1134   CREATININE 1.4* 12/03/2012 0829   CREATININE 1.31 10/15/2012 1831   CREATININE 1.4* 04/30/2012 1134      Component Value Date/Time   CALCIUM 9.4 12/03/2012 0829   CALCIUM 10.0 10/15/2012 1831   CALCIUM 8.5 04/30/2012 1134   ALKPHOS 106 12/03/2012 0829   ALKPHOS 77 07/16/2012 1122   ALKPHOS 93* 04/30/2012 1134   AST 34 12/03/2012 0829   AST 21 07/16/2012 1122   AST 36 04/30/2012 1134   ALT 68* 12/03/2012 0829   ALT 34 07/16/2012 1122   BILITOT 0.52 12/03/2012 0829   BILITOT 0.4 07/16/2012 1122   BILITOT 0.70 04/30/2012 1134     CT CHEST, ABDOMEN AND PELVIS WITH CONTRAST  Technique: Multidetector CT imaging of the chest, abdomen and  pelvis was performed following the standard protocol during bolus  administration of intravenous contrast.  Contrast: 133mL OMNIPAQUE IOHEXOL 300 MG/ML SOLN  Comparison: CTA chest from 10/15/2012. Abdomen and pelvis CT from  08/27/2012.  CT CHEST  Findings: There is no axillary lymphadenopathy. No mediastinal   lymphadenopathy. No discrete right hilar lymphadenopathy. The  posterior left hilar lymph node has decreased in size, measuring 7  mm in short axis today compared to 11 mm previously. Heart size is  normal. No pericardial or pleural effusion.  Soft tissue attenuation along the right aspect of the distal  trachea is probably mucous. A band-like focus of soft tissue  attenuation with associated bronchiectasis in the central right  upper lobe is stable and compatible with radiation fibrosis. The  soft tissue attenuation associate with the inferior right hilum has  decreased slightly in the interval measuring 12 x 18 mm today  compared to 17 x 20 mm previously. There is some scarring in the  posteromedial right lower lobe.  Left lung is without parenchymal nodule or mass.  Bone windows reveal no worrisome lytic or sclerotic osseous  lesions.  IMPRESSION:  Interval decrease in the left hilar lymph node and in the soft  tissue attenuation seen just anterior to the lower right hilum. No  new or  progressive disease in the chest.  CT ABDOMEN AND PELVIS  Findings: 6 mm low density lesion in the anterior right liver is  stable and probably represents a tiny cyst. The spleen is normal.  Stomach, duodenum, gallbladder, and adrenal glands are  unremarkable.  As before, multiple enhancing foci are seen in the pancreas,  involving the pancreatic head and neck. The dominant lesion is  near the junction of the neck and body. This lesion measures 1.1 x  1.7 cm today compared 1.6 x 2.0 cm previously. There is marked  dilatation of the pancreatic duct in the body and tail the  pancreas, as before. The  Left kidney is surgically absent. Right kidney is unremarkable.  No abdominal aortic aneurysm. No lymphadenopathy in the abdomen.  The abdominal bowel loops are normal in appearance.  Imaging through the pelvis shows no free intraperitoneal fluid. No  pelvic sidewall lymphadenopathy. Bladder is  unremarkable. No  substantial diverticular change in the colon. No colonic  diverticulitis. Terminal ileum is normal. The appendix is normal.  Bone windows reveal no worrisome lytic or sclerotic osseous  lesions.  IMPRESSION:  The enhancing pancreatic parenchymal lesions are less conspicuous  today in the dominant lesion measures slightly smaller. These are  associated with continued marked dilatation of the main pancreatic  duct.  Otherwise stable exam without evidence for new or progressive  disease.   Impression and Plan:  This is a pleasant 46 year old gentleman with the following issues. 1. Metastatic renal cell carcinoma.  He has documented disease to the lung and the pancreas.  He tolerating Sutent well. He does have low grade hand-foot syndrome. CT scan discussed today and showed good response to Sutent. The plan is to continue current dose and schedule.  2. Pancreatic thickening/mass.  This has been biopsy proven to be a renal cell metastasis. No change in treatment at this time. 3. Depression/Anxiety: mood much improved off Cymbalta now.  4. Foot/lower legs  pain: unclear etiology. Stable now. 5. Follow up: 4 weeks.  Zhana Jeangilles 12/20/20133:37 PM

## 2012-12-30 ENCOUNTER — Other Ambulatory Visit: Payer: Self-pay | Admitting: Oncology

## 2013-01-08 IMAGING — CT CT CHEST W/ CM
2 of 5 series · 12 of 36 positions shown, 18 images · IV contrast (READICAT/WATER & [ID] OMNI 300)
Comparison: [HOSPITAL] at [REDACTED] [HOSPITAL] chest,
abdominal, pelvic CT 12/27/2010 and [REDACTED]chest CT
10/14/2010 and nuclear medicine PET-CT 05/11/2009,  chest - abdomen
- pelvic CT 12/18/2008.

CT CHEST

CLINICAL DATA: Renal cell carcinoma. Left nephrectomy [DATE],
right sided lung cancer [DATE] post surgery, XRT, chemotherapy.
Former smoker.

CT CHEST, ABDOMEN AND PELVIS WITH CONTRAST
TECHNIQUE: Multidetector CT imaging of the chest, abdomen and
pelvis was performed following the standard protocol during bolus
administration of intravenous contrast.
Contrast: Intravenous 100 ml [id number].

[Series 601: coronal body · coronal · 1.29mm/px · 1 of 127 slices shown, 2 images]
[im 43/127  soft-tissue]
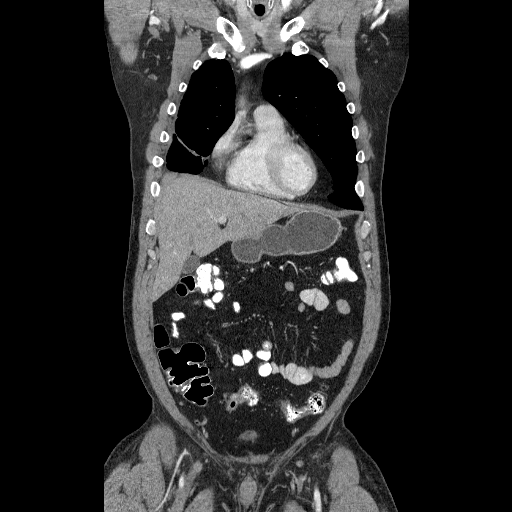
[im 43/127  bone]
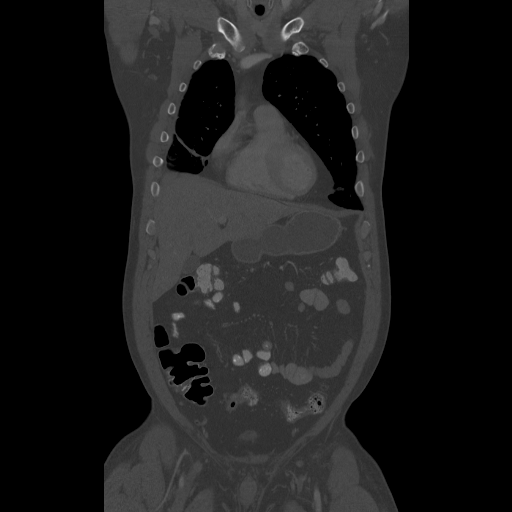

[Series 602: sagittal body · sagittal · 1.29mm/px · 11 of 163 slices shown, 16 images]
[im 13/163  soft-tissue]
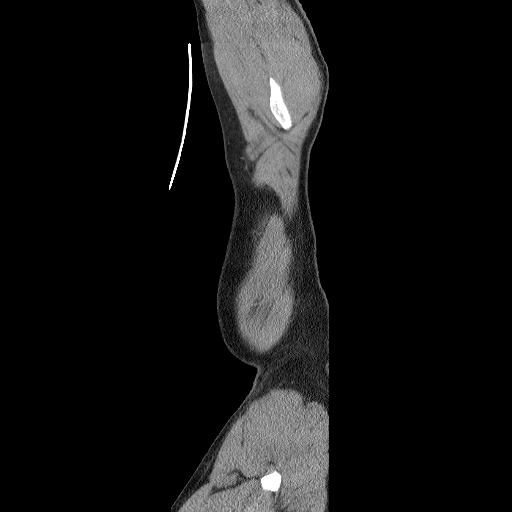
[im 13/163  lung]
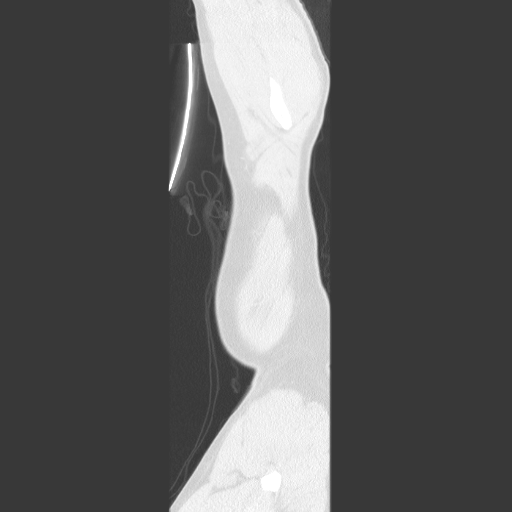
[im 13/163  bone]
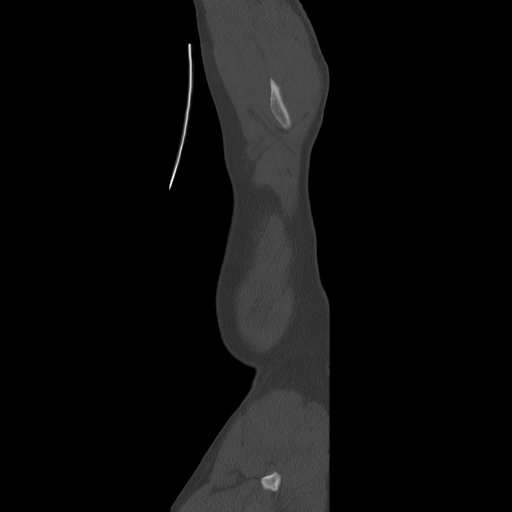
[im 25/163  soft-tissue]
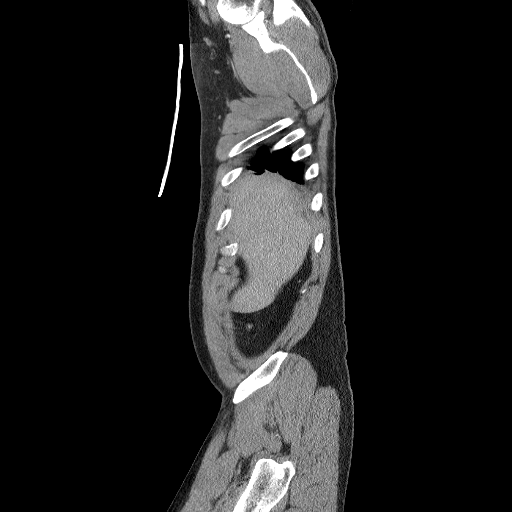
[im 25/163  lung]
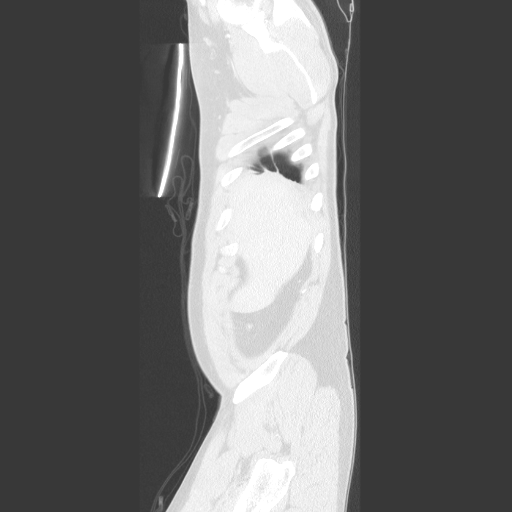
[im 38/163  lung]
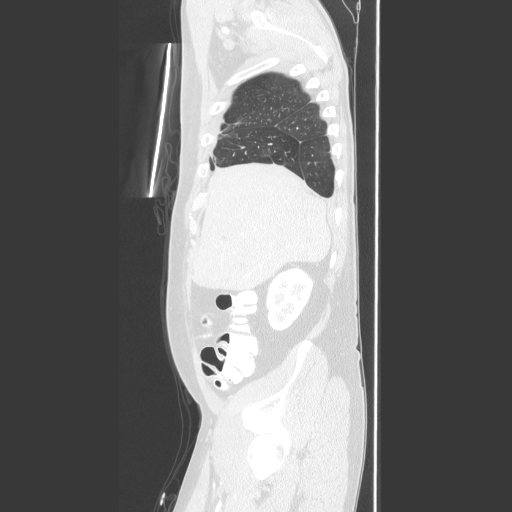
[im 50/163  soft-tissue]
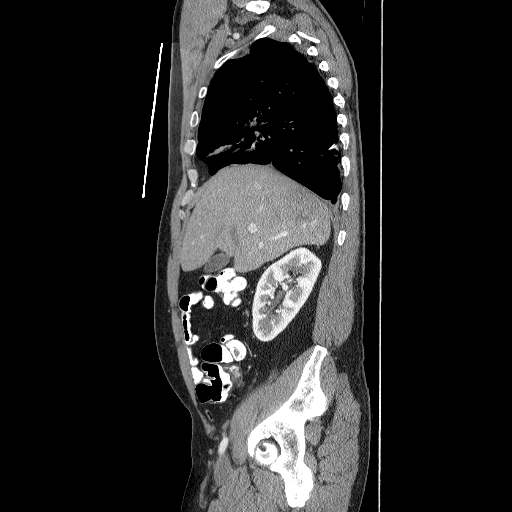
[im 50/163  lung]
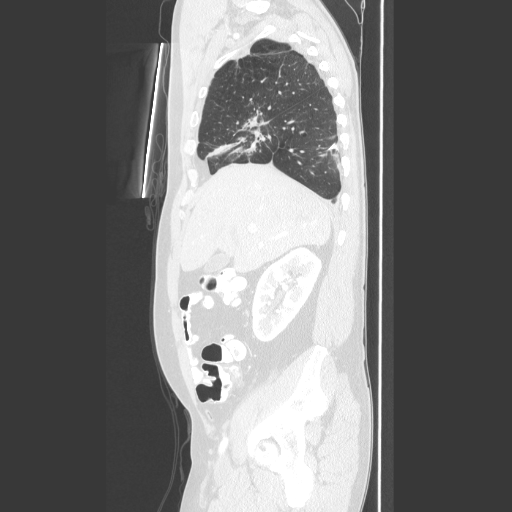
[im 63/163  soft-tissue]
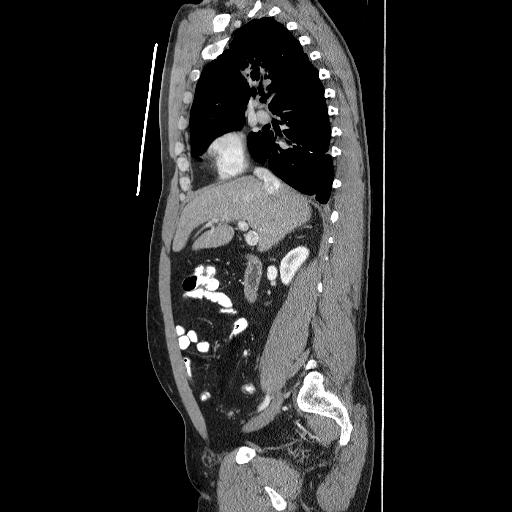
[im 75/163  soft-tissue]
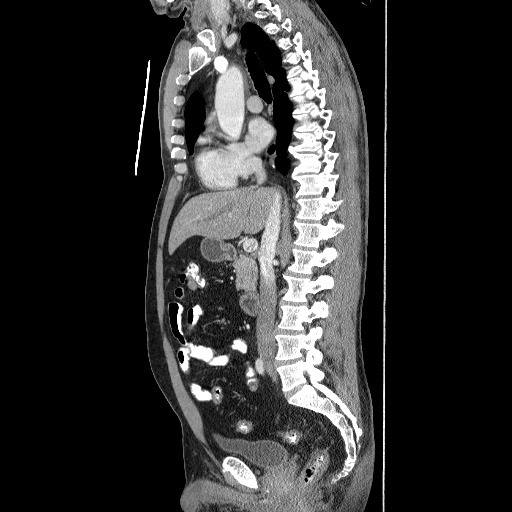
[im 88/163  soft-tissue]
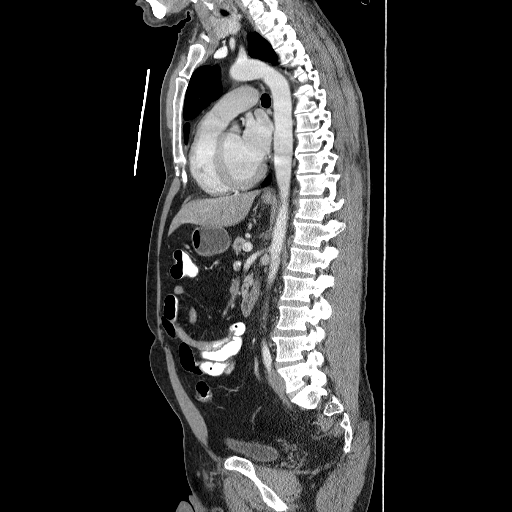
[im 100/163  soft-tissue]
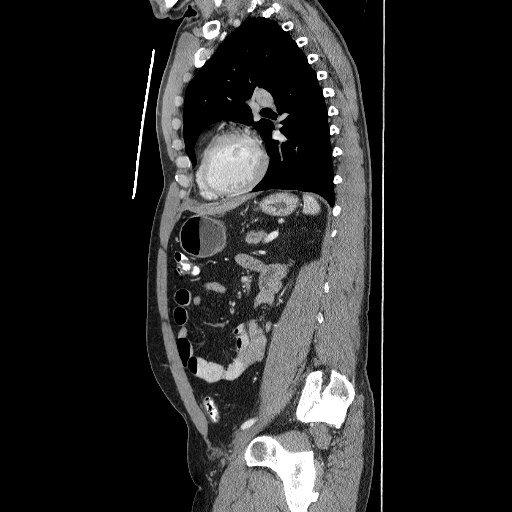
[im 125/163  soft-tissue]
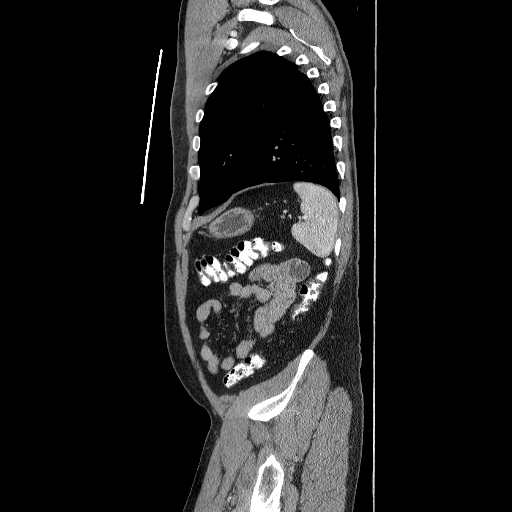
[im 138/163  soft-tissue]
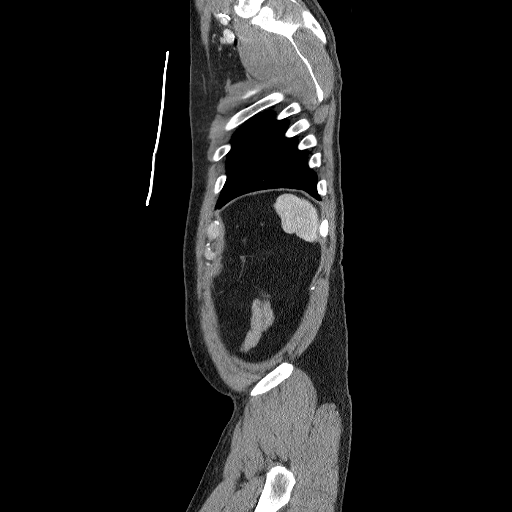
[im 138/163  bone]
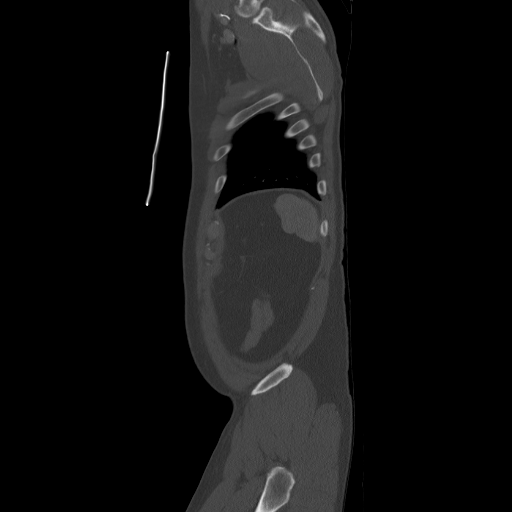
[im 150/163  soft-tissue]
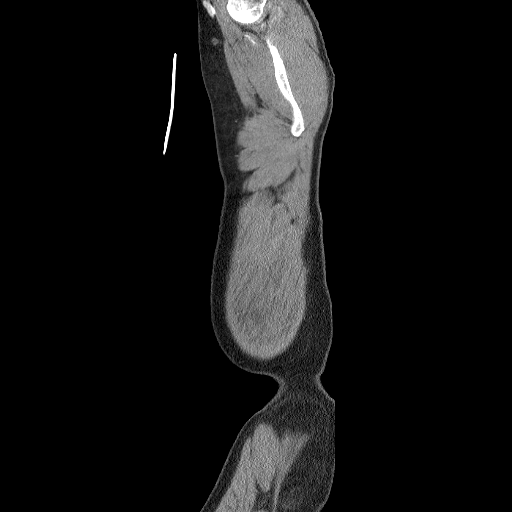

[12 of 36 positions shown; findings below may reference images not displayed]

FINDINGS: Since 12/27/2010 stable findings with 6 mm right lower
lobe nodule (axial image 37 and post XRT and surgical scarring
right lung.  Unchanged slight upper lobe centrilobular emphysema
visualized.  Lungs are otherwise clear with no new metastatic
nodules.

No new mediastinal, hilar, axillary, supraclavicular
mass/adenopathy visualized.

Heart size remains normal.

No new osseous metastatic lesions seen.
IMPRESSION: Since 12/27/2010:
[DATE].  Stable 6 mm right lower lobe nodule and post surgical/
radiation changes right lung.
2.  Otherwise, no interval acute or metastatic disease.

CT ABDOMEN AND PELVIS
FINDINGS: Left nephrectomy bed appears normal with no locally
recurrent renal cell cancer.

Stable 5 mm right hepatic low densities cyst seen since 12/18/2008
and with slight diffuse fatty infiltration liver unchanged since
more recent studies.

Newly discernible since prior studies is marginated enhancing
pancreatic body lesion measuring 1.2 cm long (sagittal image 92) X
1.2 cm AP X 1.7 cm wide (axial image 63/3).  Mild 3-4 mm pancreatic
ductal prominence seen at pancreatic neck level (axial image 66/3).
Pancreas otherwise normal-appearing without inflammatory change.

Remaining abdominal and pelvic organs appear normal without
interval inflammation, free fluid or adenopathy.

No interval osseous metastatic lesion seen.
IMPRESSION: 1.  Stable left nephrectomy.
2.  Interval visualization of marginated enhancing lesion
pancreatic body measuring up to 1.7 cm.  Differential diagnosis
includes metastatic renal cell carcinoma, primary pancreatic islet
cell tumor.  Recommend abdominal MRI pancreatic protocol pre and
postcontrast for further evaluation.  Alternatively, endoscopic
ultrasound may be contributory as well. (Nuclear medicine PET-CT
limited for assessment of possible primary pancreatic islet cell
tumor).
3.  Stable right hepatic 5 mm cyst, slight diffuse fatty
infiltration liver.
4.  No additional acute or metastatic disease findings.

## 2013-01-14 ENCOUNTER — Telehealth: Payer: Self-pay | Admitting: Oncology

## 2013-01-14 ENCOUNTER — Encounter: Payer: BC Managed Care – PPO | Admitting: Oncology

## 2013-01-14 ENCOUNTER — Other Ambulatory Visit: Payer: BC Managed Care – PPO | Admitting: Lab

## 2013-01-14 ENCOUNTER — Encounter: Payer: Self-pay | Admitting: *Deleted

## 2013-01-14 NOTE — Progress Notes (Signed)
This encounter was created in error - please disregard.

## 2013-01-14 NOTE — Telephone Encounter (Signed)
lvm for pt regarding Feb appt.

## 2013-01-14 NOTE — Progress Notes (Signed)
RECEIVED A FAX FROM [facility] CONCERNING A CONFIRMATION OF PRESCRIPTION SHIPMENT FOR SUTENT ON 01/13/13.

## 2013-01-26 IMAGING — CT CT HEAD W/O CM
1 series · 16 of 30 positions shown, 20 images · non-contrast
Comparison: PET CT 05/11/2009.

CLINICAL DATA: 44-year-old male with abdominal pain, weakness,
headache, dizziness.  History of metastatic renal cell carcinoma.

CT HEAD WITHOUT CONTRAST
TECHNIQUE: Contiguous axial images were obtained from the base of
the skull through the vertex without contrast.

[Series 2: head_seq 4.5 h37s st · axial · 0.43mm/px · z∈[+1120,+1264]mm · 16 of 36 slices shown, 20 images]
[im 2/36  brain]
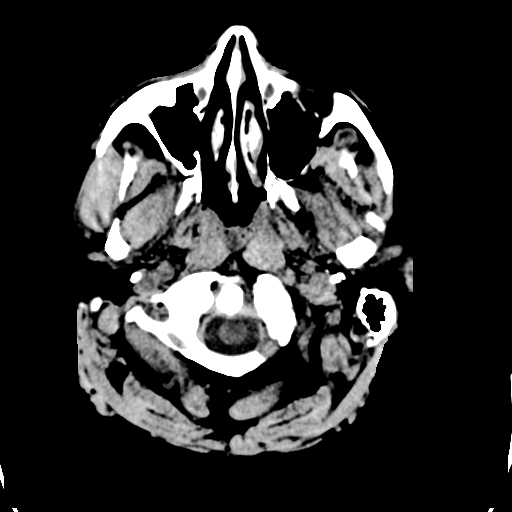
[im 2/36  bone]
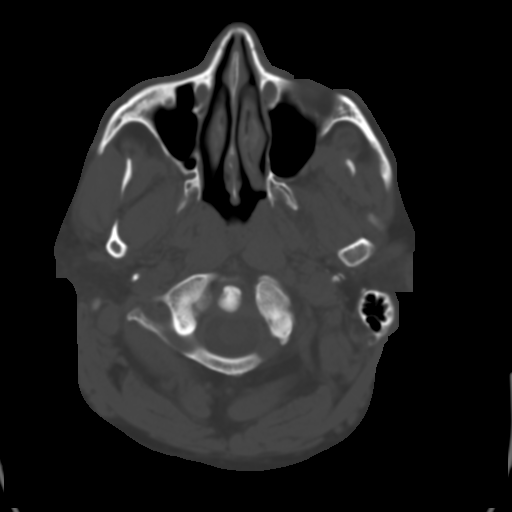
[im 4/36  brain]
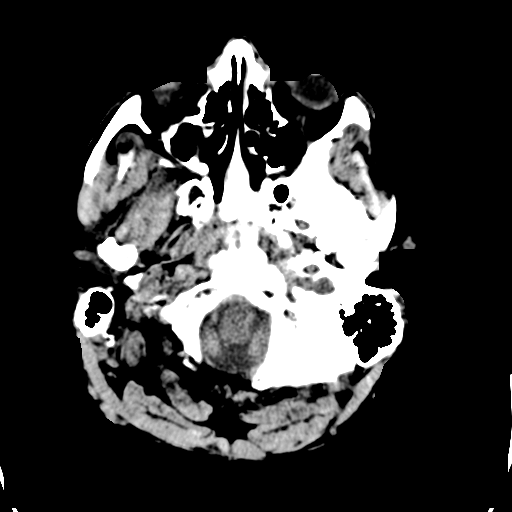
[im 7/36  brain]
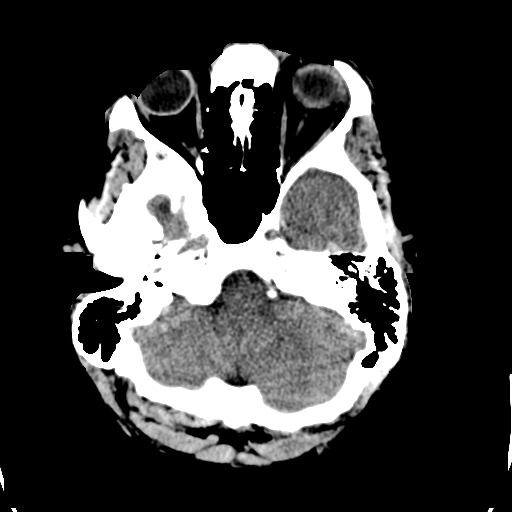
[im 9/36  brain]
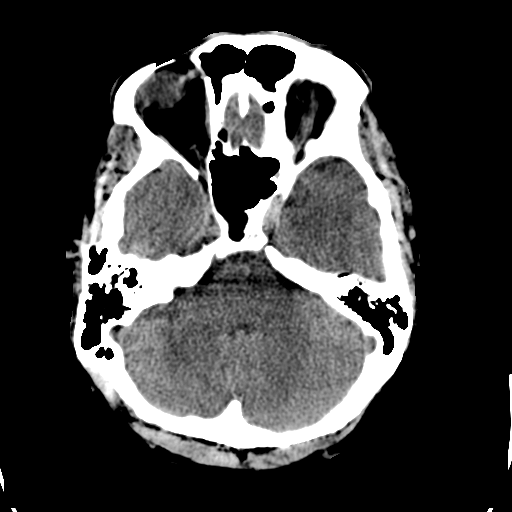
[im 10/36  brain]
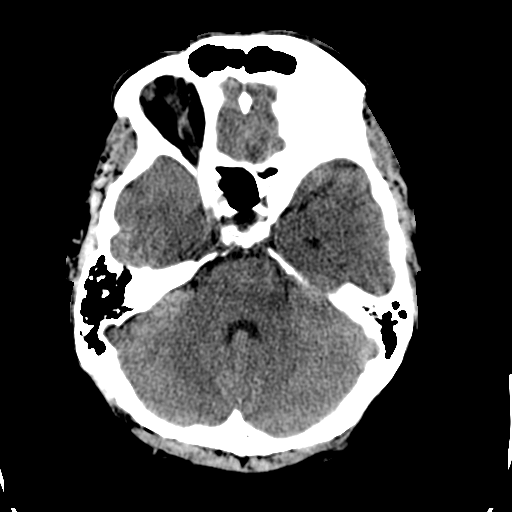
[im 10/36  bone]
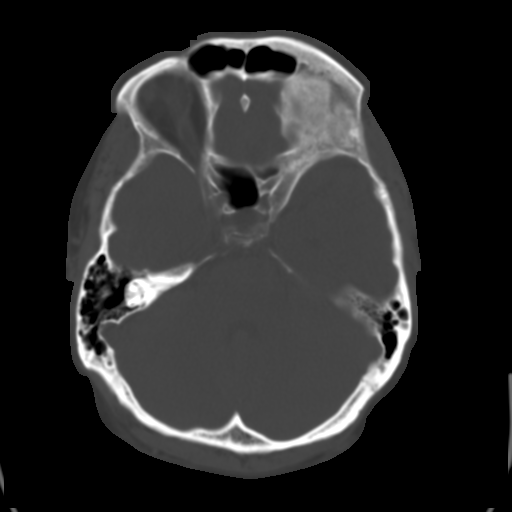
[im 13/36  brain]
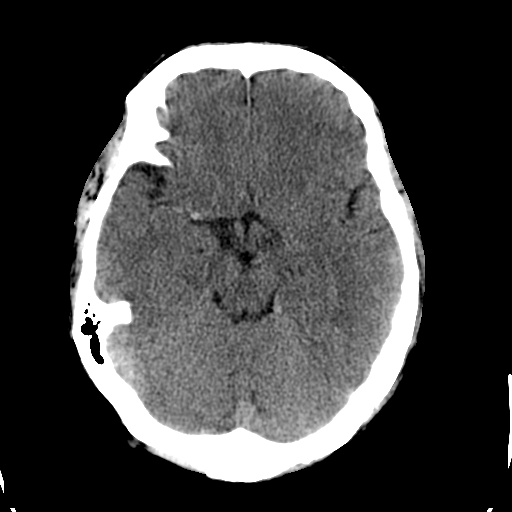
[im 15/36  brain]
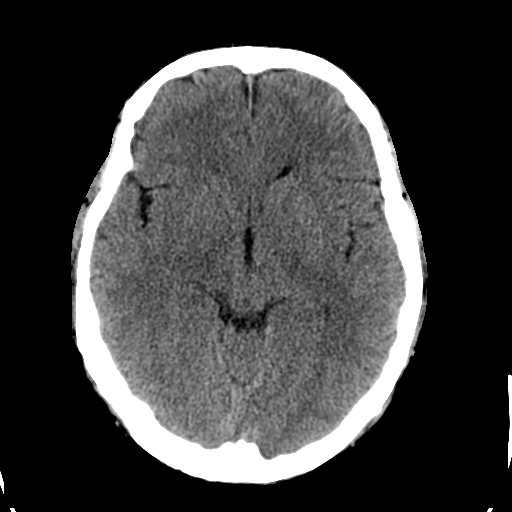
[im 17/36  brain]
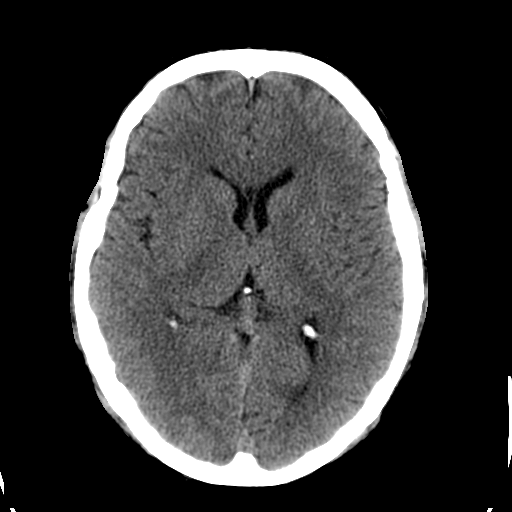
[im 19/36  brain]
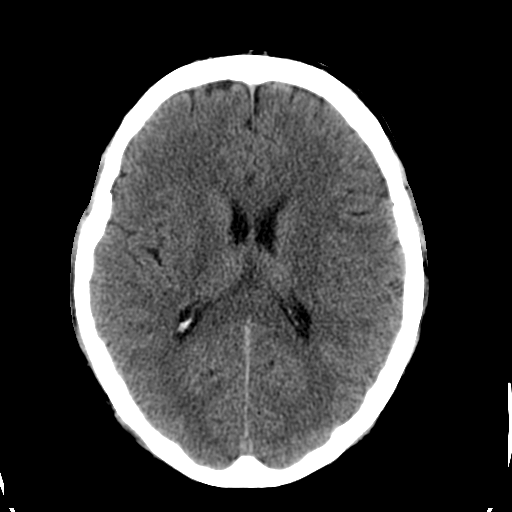
[im 19/36  bone]
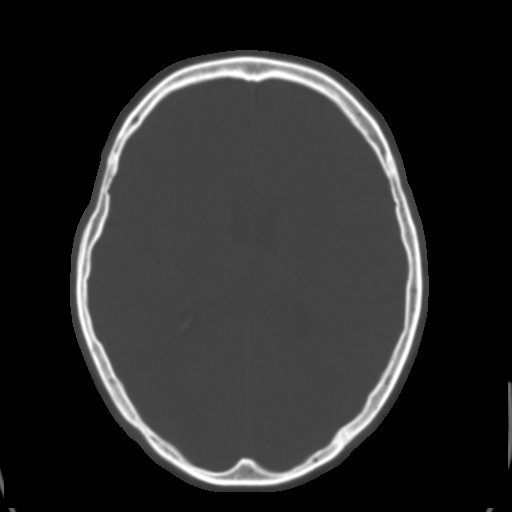
[im 21/36  brain]
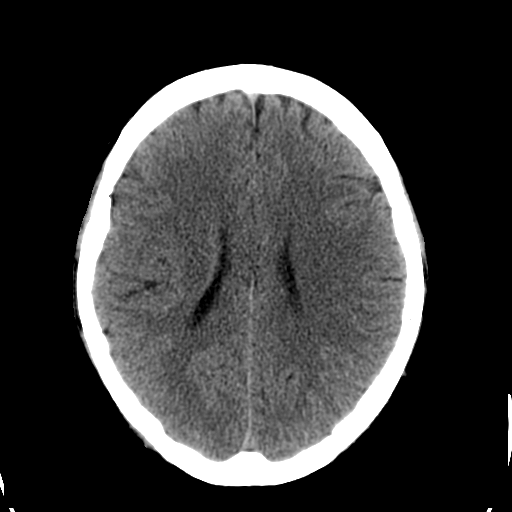
[im 23/36  brain]
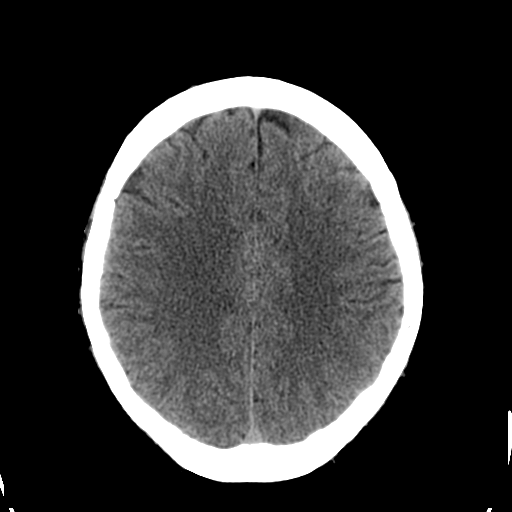
[im 26/36  brain]
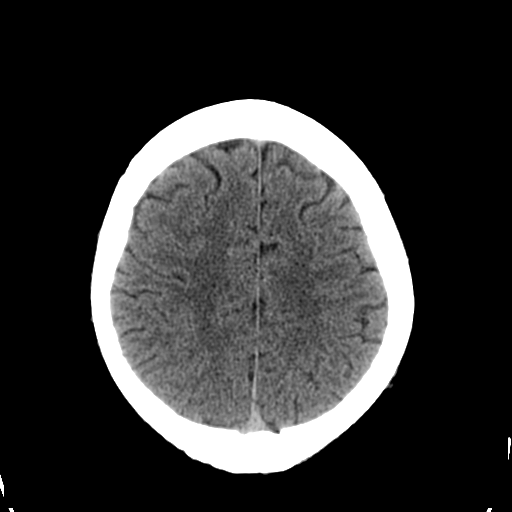
[im 27/36  brain]
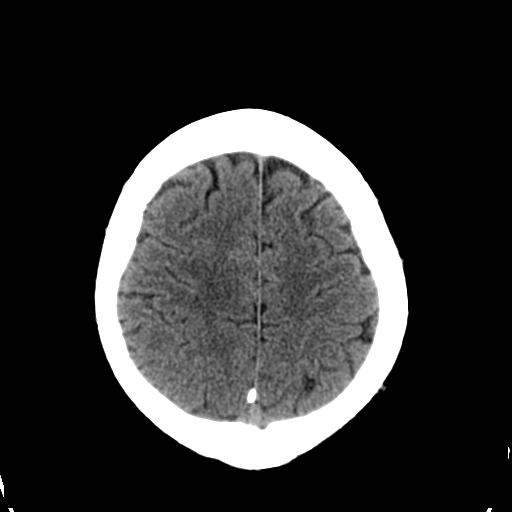
[im 27/36  bone]
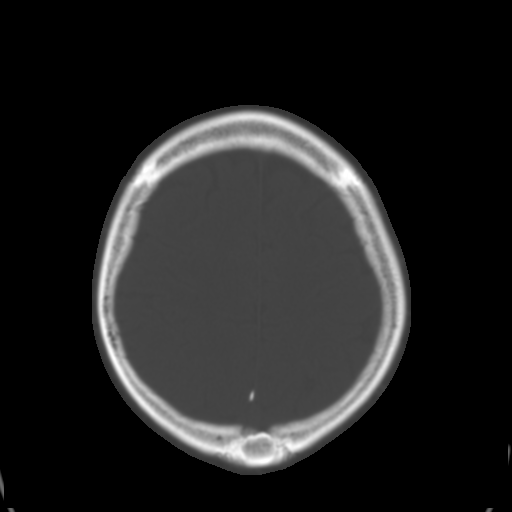
[im 29/36  brain]
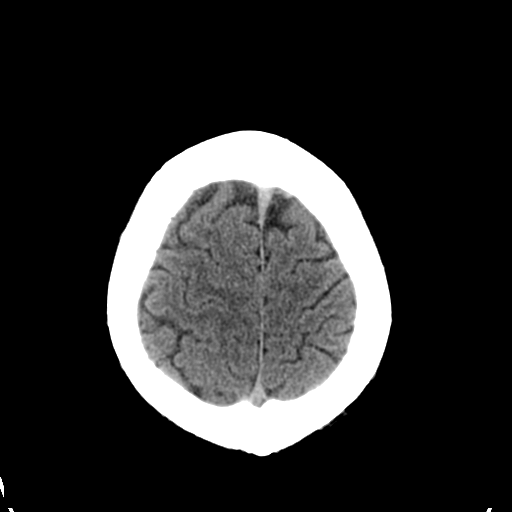
[im 32/36  brain]
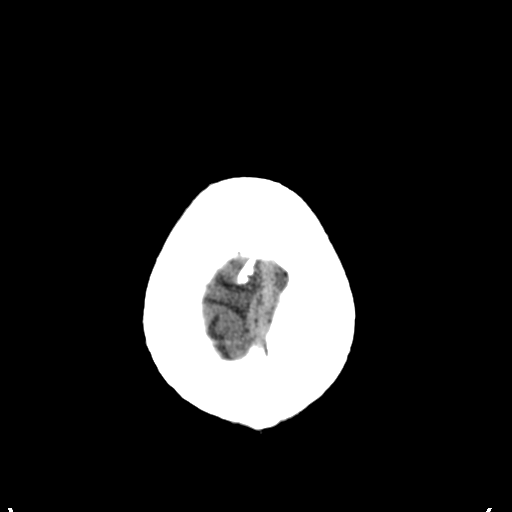
[im 34/36  brain]
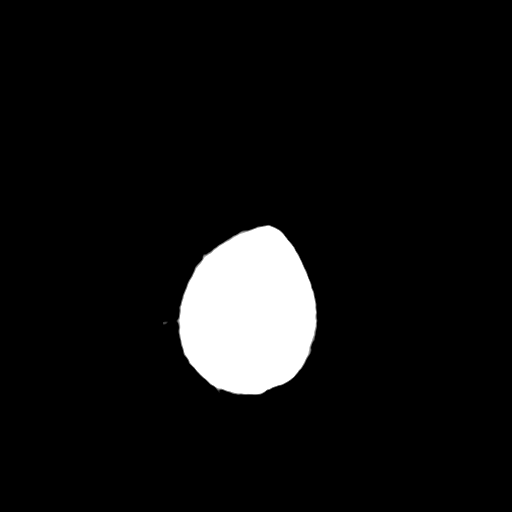

[16 of 30 positions shown; findings below may reference images not displayed]

FINDINGS: Visualized orbits and scalp soft tissues are within
normal limits.  Visualized paranasal sinuses and mastoids are
clear.  No acute osseous abnormality identified.

Cerebral volume is within normal limits for age.  No midline shift,
ventriculomegaly, mass effect, evidence of mass lesion,
intracranial hemorrhage or evidence of cortically based acute
infarction.  Gray-white matter differentiation is within normal
limits throughout the brain.  No suspicious intracranial vascular
hyperdensity.
IMPRESSION: Normal noncontrast CT appearance of the brain. Early metastatic
disease to the brain cannot be excluded in the absence of
intravenous contrast.

## 2013-01-26 IMAGING — CR DG CHEST 1V PORT
1 series · 1 of 1 positions shown · non-contrast
Comparison: 03/07/2011

CLINICAL DATA: Abnormal breath sounds

PORTABLE CHEST - 1 VIEW

[AP]
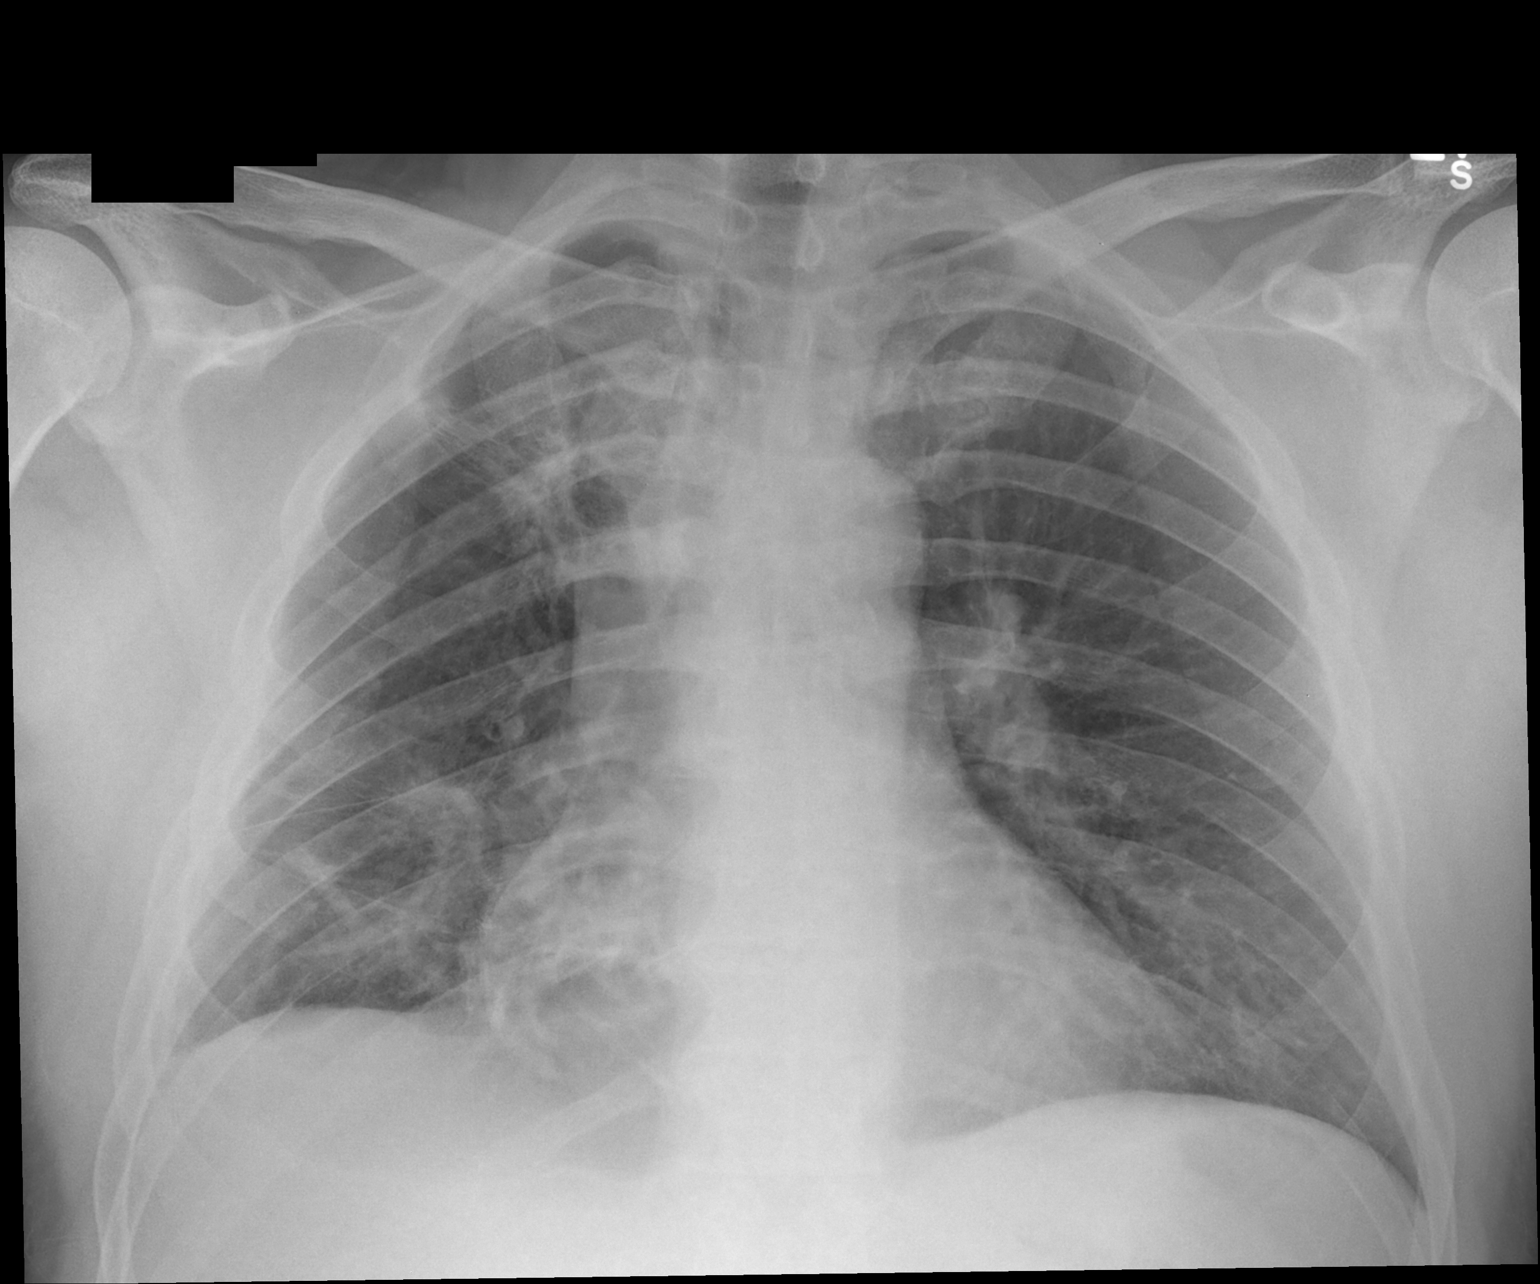

[1 of 1 positions shown; findings below may reference images not displayed]

FINDINGS: Heart size is normal.

No pleural effusion or pulmonary edema identified.

Scarring is again noted within the right upper lobe and right base.
No superimposed airspace consolidation identified.

Review of the visualized osseous structures is unremarkable.
IMPRESSION: 1.  Right lung scarring.

## 2013-01-28 ENCOUNTER — Telehealth: Payer: Self-pay | Admitting: Oncology

## 2013-01-28 ENCOUNTER — Other Ambulatory Visit (HOSPITAL_BASED_OUTPATIENT_CLINIC_OR_DEPARTMENT_OTHER): Payer: BC Managed Care – PPO | Admitting: Lab

## 2013-01-28 ENCOUNTER — Ambulatory Visit (HOSPITAL_BASED_OUTPATIENT_CLINIC_OR_DEPARTMENT_OTHER): Payer: BC Managed Care – PPO | Admitting: Oncology

## 2013-01-28 ENCOUNTER — Encounter: Payer: Self-pay | Admitting: Oncology

## 2013-01-28 VITALS — BP 143/100 | HR 67 | Temp 97.8°F | Resp 20 | Ht 73.0 in | Wt 225.0 lb

## 2013-01-28 DIAGNOSIS — C649 Malignant neoplasm of unspecified kidney, except renal pelvis: Secondary | ICD-10-CM

## 2013-01-28 DIAGNOSIS — C7800 Secondary malignant neoplasm of unspecified lung: Secondary | ICD-10-CM

## 2013-01-28 DIAGNOSIS — L270 Generalized skin eruption due to drugs and medicaments taken internally: Secondary | ICD-10-CM

## 2013-01-28 DIAGNOSIS — C7889 Secondary malignant neoplasm of other digestive organs: Secondary | ICD-10-CM

## 2013-01-28 LAB — CBC WITH DIFFERENTIAL/PLATELET
BASO%: 0.2 % (ref 0.0–2.0)
Basophils Absolute: 0 10e3/uL (ref 0.0–0.1)
EOS%: 0.9 % (ref 0.0–7.0)
Eosinophils Absolute: 0 10e3/uL (ref 0.0–0.5)
HCT: 41.1 % (ref 38.4–49.9)
HGB: 14.1 g/dL (ref 13.0–17.1)
LYMPH%: 35.4 % (ref 14.0–49.0)
MCH: 35.8 pg — ABNORMAL HIGH (ref 27.2–33.4)
MCHC: 34.4 g/dL (ref 32.0–36.0)
MCV: 103.8 fL — ABNORMAL HIGH (ref 79.3–98.0)
MONO#: 0.4 10e3/uL (ref 0.1–0.9)
MONO%: 7.5 % (ref 0.0–14.0)
NEUT#: 2.9 10e3/uL (ref 1.5–6.5)
NEUT%: 56 % (ref 39.0–75.0)
Platelets: 159 10e3/uL (ref 140–400)
RBC: 3.95 10e6/uL — ABNORMAL LOW (ref 4.20–5.82)
RDW: 15.7 % — ABNORMAL HIGH (ref 11.0–14.6)
WBC: 5.2 10e3/uL (ref 4.0–10.3)
lymph#: 1.8 10e3/uL (ref 0.9–3.3)

## 2013-01-28 LAB — COMPREHENSIVE METABOLIC PANEL (CC13)
ALT: 49 U/L (ref 0–55)
AST: 26 U/L (ref 5–34)
Albumin: 3.8 g/dL (ref 3.5–5.0)
Alkaline Phosphatase: 103 U/L (ref 40–150)
BUN: 13.6 mg/dL (ref 7.0–26.0)
CO2: 26 meq/L (ref 22–29)
Calcium: 9.2 mg/dL (ref 8.4–10.4)
Chloride: 103 meq/L (ref 98–107)
Creatinine: 1.4 mg/dL — ABNORMAL HIGH (ref 0.7–1.3)
Glucose: 140 mg/dL — ABNORMAL HIGH (ref 70–99)
Potassium: 4.4 meq/L (ref 3.5–5.1)
Sodium: 139 meq/L (ref 136–145)
Total Bilirubin: 0.38 mg/dL (ref 0.20–1.20)
Total Protein: 7.7 g/dL (ref 6.4–8.3)

## 2013-01-28 MED ORDER — ALPRAZOLAM 1 MG PO TABS: 1.0000 mg | ORAL_TABLET | Freq: Three times a day (TID) | ORAL | Status: DC | PRN

## 2013-01-28 MED ORDER — DULOXETINE HCL 30 MG PO CPEP: 30.0000 mg | ORAL_CAPSULE | Freq: Every day | ORAL | Status: DC

## 2013-01-28 NOTE — Patient Instructions (Addendum)
You may use over the counter Ibuprofen 400 mg every 6 hours or Aleve 220 mg every 12 hours for your pain. If you develop chest pain or have difficulty breathing, report to the ER for evaluation.

## 2013-01-28 NOTE — Progress Notes (Signed)
Hematology and Oncology Follow Up Visit  Booker Zimmerli [id number] May 23, 1966 47 y.o. 01/28/2013 8:25 PM  [name], MD  [name], M.D.  [name], MD  [name], MD    Principle Diagnosis: This is a 47 year old gentleman with stage IV renal cell carcinoma diagnosed in 2009.  Prior Therapy: 1. Status post laparoscopic radical nephrectomy.  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 2. Patient status post thoracotomy done October 2009.  He had a lower lobe nodule, biopsy proven to be metastatic renal cell carcinoma.   3. Patient is status post stereotactic radiotherapy to pulmonary nodules in May of 2010.  Current therapy: He is on Sutent 50 mg 4 weeks on 2 weeks off since November of 2011, has stable disease. His disease include pancreatic involvement with RCC.  Interim History:  Mr. Loschiavo presents today for a followup visit.  He has tolerated Sutent very well without any complications this month.  Reporting discomfort in his right ribs that radiates around his body. Started about 4 days ago. He is not reporting any abdominal pain. He did report grade 1-2 hand-foot syndrome. His pain in the his feet as well as the ankles. Has not reported any other complications. He is no longer taking Cymbalta, but plans to restart it. He is still working full time. He reports foot pain at times but still able to work.  Medications: I have reviewed the patient's current medications. Current outpatient prescriptions:acetaminophen (TYLENOL) 325 MG tablet, Take 650 mg by mouth as needed.  , Disp: , Rfl: ;  ALPRAZolam (XANAX) 1 MG tablet, Take 1 tablet (1 mg total) by mouth every 8 (eight) hours as needed. Anxiety, Disp: 30 tablet, Rfl: 1;  DULoxetine (CYMBALTA) 30 MG capsule, Take 1 capsule (30 mg total) by mouth daily., Disp: 30 capsule, Rfl: 2 gabapentin (NEURONTIN) 300 MG capsule, TAKE 1 CAPSULE BY MOUTH AT BEDTIME, Disp: 60 capsule, Rfl: 1;  ibuprofen (ADVIL,MOTRIN) 200  MG tablet, Take 200 mg by mouth every 6 (six) hours as needed. pain, Disp: , Rfl: ;  oxyCODONE-acetaminophen (ROXICET) 5-325 MG per tablet, Take 1 tablet by mouth every 6 (six) hours as needed for pain., Disp: 30 tablet, Rfl: 0 pseudoephedrine-acetaminophen (TYLENOL SINUS) 30-500 MG TABS, Take 1 tablet by mouth every 4 (four) hours as needed., Disp: , Rfl: ;  SUTENT 50 MG capsule, Take 50 mg by mouth. Take 1 capsule daily X 28 days; off 14 days; restart, Disp: , Rfl: ;  Tamsulosin HCl (FLOMAX) 0.4 MG CAPS, Take 0.4 mg by mouth Daily., Disp: , Rfl:   Allergies:  Allergies  Allergen Reactions  . Ceftriaxone Hives  . Hydrocodone Swelling    Past Medical History, Surgical history, Social history, and Family History were reviewed and updated.  Review of Systems: Constitutional:  Negative for fever, chills, night sweats, anorexia, weight loss, pain. Cardiovascular: no chest pain or dyspnea on exertion Respiratory: no cough, shortness of breath, or wheezing Neurological: no TIA or stroke symptoms Dermatological: negative ENT: negative Skin: Negative. Gastrointestinal: no abdominal pain, change in bowel habits, or black or bloody stools Genito-Urinary: no dysuria, trouble voiding, or hematuria Hematological and Lymphatic: negative Breast: negative Musculoskeletal: negative for - joint swelling, muscle pain or pain in foot - bilateral Remaining ROS negative.  Physical Exam: Blood pressure 143/100, pulse 67, temperature 97.8 F (36.6 C), temperature source Oral, resp. rate 20, height 6\' 1"  (1.854 m), weight 225 lb (102.059 kg). ECOG: 0 General appearance: alert Head: Normocephalic, without  obvious abnormality, atraumatic Neck: no adenopathy, no carotid bruit, no JVD, supple, symmetrical, trachea midline and thyroid not enlarged, symmetric, no tenderness/mass/nodules Lymph nodes: Cervical, supraclavicular, and axillary nodes normal. Heart:regular rate and rhythm, S1, S2 normal, no murmur,  click, rub or gallop Lung:chest clear, no wheezing, rales, normal symmetric air entry. Chest wall pain is reproducible with pressure. Abdomen: soft, non-tender, without masses or organomegaly EXT:no edema, no despumation. No erythema noted. Tender to touch at the ankle and shin area on both sides.    Lab Results: Lab Results  Component Value Date   WBC 5.2 01/28/2013   HGB 14.1 01/28/2013   HCT 41.1 01/28/2013   MCV 103.8* 01/28/2013   PLT 159 01/28/2013     Chemistry      Component Value Date/Time   NA 139 01/28/2013 1221   NA 136 10/15/2012 1831   NA 139 04/30/2012 1134   K 4.4 01/28/2013 1221   K 4.9 10/15/2012 1831   K 4.3 04/30/2012 1134   CL 103 01/28/2013 1221   CL 98 10/15/2012 1831   CL 96* 04/30/2012 1134   CO2 26 01/28/2013 1221   CO2 29 10/15/2012 1831   CO2 29 04/30/2012 1134   BUN 13.6 01/28/2013 1221   BUN 18 10/15/2012 1831   BUN 14 04/30/2012 1134   CREATININE 1.4* 01/28/2013 1221   CREATININE 1.31 10/15/2012 1831   CREATININE 1.4* 04/30/2012 1134      Component Value Date/Time   CALCIUM 9.2 01/28/2013 1221   CALCIUM 10.0 10/15/2012 1831   CALCIUM 8.5 04/30/2012 1134   ALKPHOS 103 01/28/2013 1221   ALKPHOS 77 07/16/2012 1122   ALKPHOS 93* 04/30/2012 1134   AST 26 01/28/2013 1221   AST 21 07/16/2012 1122   AST 36 04/30/2012 1134   ALT 49 01/28/2013 1221   ALT 34 07/16/2012 1122   BILITOT 0.38 01/28/2013 1221   BILITOT 0.4 07/16/2012 1122   BILITOT 0.70 04/30/2012 1134     Impression and Plan:  This is a pleasant 47 year old gentleman with the following issues. 1. Metastatic renal cell carcinoma.  He has documented disease to the lung and the pancreas.  He tolerating Sutent well. He does have low grade hand-foot syndrome. CT scan from December 2013 showed a good response to Sutent. The plan is to continue current dose and schedule.  2. Pancreatic thickening/mass.  This has been biopsy proven to be a renal cell metastasis. No change in treatment at this time. 3. Depression/Anxiety: More  irritable. Plans to restart Cymbalta. 4. Foot/lower legs  pain: unclear etiology. Stable now. 5. Rib discomfort. Likely musculoskeletal in nature. Encouraged him to use Ibuprofen or Aleve OTC. He needs to report to ER for chest pain or difficulty breathing. 6. Follow up: 6 weeks.  Mikey Bussing 2/7/20148:25 PM

## 2013-01-28 NOTE — Telephone Encounter (Signed)
Gave pt appt for lab and MD on MArch 2014

## 2013-02-05 ENCOUNTER — Other Ambulatory Visit: Payer: Self-pay

## 2013-02-17 IMAGING — CR DG RIBS 2V*R*
2 series · 2 of 2 positions shown · non-contrast
Comparison: Chest x-ray 03/24/2004.

CLINICAL DATA: Right rib pain.

RIGHT RIBS - 2 VIEW

[w ribs ap/pa lower right *]
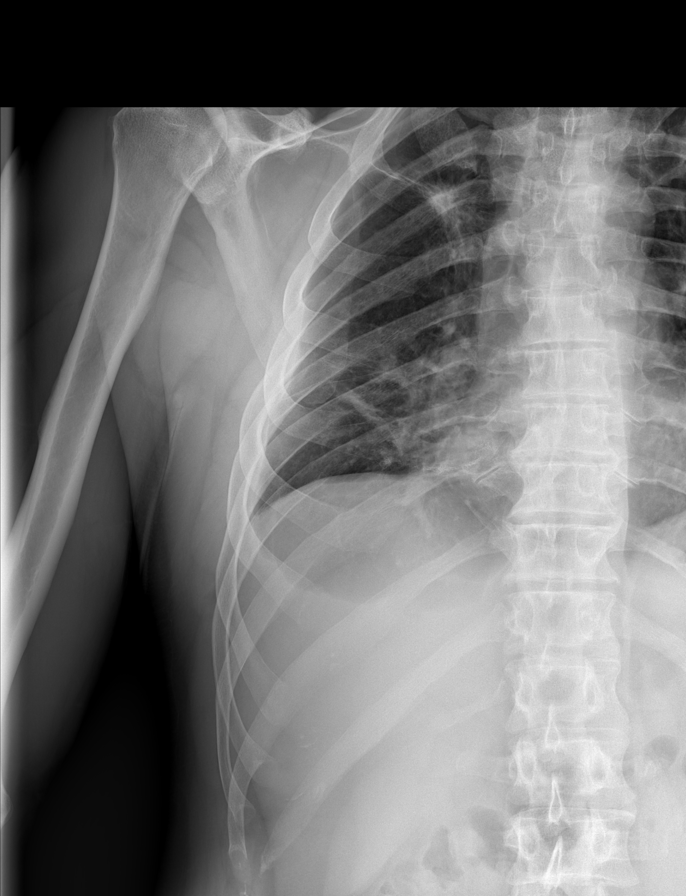

[w ribs oblique right *]
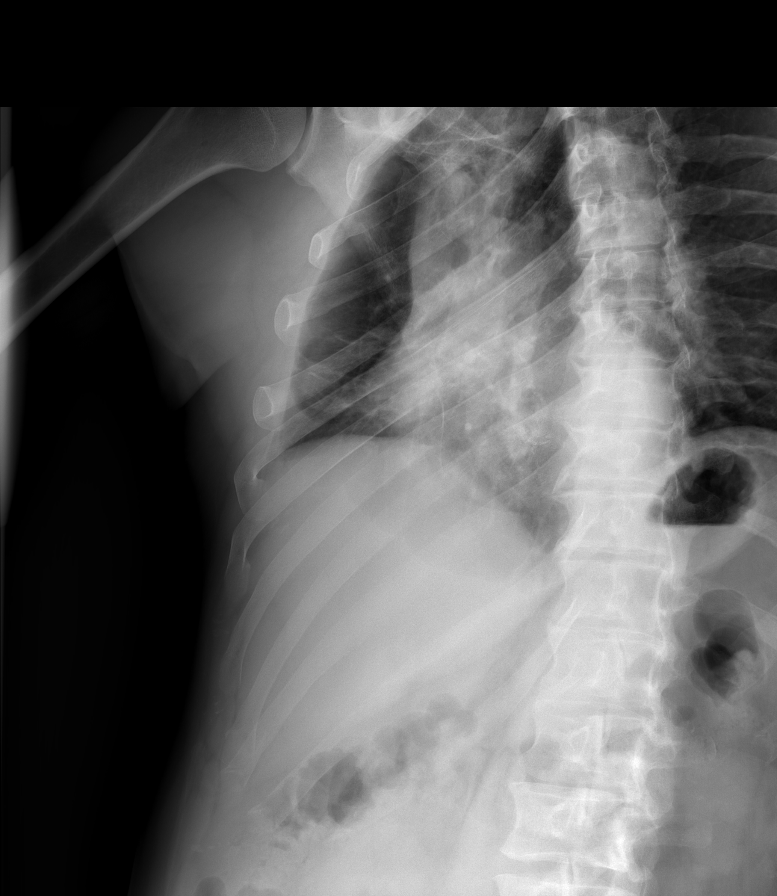

[2 of 2 positions shown; findings below may reference images not displayed]

FINDINGS: Stable right upper lobe and right lower lobe scarring
changes.  No acute pulmonary findings.  The right ribs are intact.
No acute fracture or destructive bony changes.
IMPRESSION: Persistent right upper lobe and right lower lobe scarring changes.
No acute rib findings.

## 2013-02-24 ENCOUNTER — Other Ambulatory Visit: Payer: Self-pay | Admitting: *Deleted

## 2013-02-24 DIAGNOSIS — C649 Malignant neoplasm of unspecified kidney, except renal pelvis: Secondary | ICD-10-CM

## 2013-02-24 MED ORDER — SUNITINIB MALATE 50 MG PO CAPS: 50.0000 mg | ORAL_CAPSULE | Freq: Every day | ORAL | Status: DC

## 2013-02-24 NOTE — Telephone Encounter (Signed)
THIS REFILL REQUEST FOR SUTENT WAS PLACED IN DR.SHADAD'S ACTIVE WORK FOLDER. 

## 2013-02-24 NOTE — Addendum Note (Signed)
Addended by: Wyonia Hough on: 02/24/2013 10:34 AM   Modules accepted: Orders

## 2013-03-01 NOTE — Telephone Encounter (Signed)
RECEIVED A FAX FROM [employer] CONCERNING A CONFIRMATION OF PRESCRIPTION SHIPMENT FOR SUTENT ON 02/28/13.

## 2013-03-02 ENCOUNTER — Telehealth: Payer: Self-pay | Admitting: *Deleted

## 2013-03-02 ENCOUNTER — Other Ambulatory Visit: Payer: Self-pay | Admitting: Oncology

## 2013-03-02 DIAGNOSIS — C649 Malignant neoplasm of unspecified kidney, except renal pelvis: Secondary | ICD-10-CM

## 2013-03-02 NOTE — Telephone Encounter (Signed)
Patient calling to say he is having pain from left side where kidney was removed, all the way down his left leg. Had to leave work Monday 02/28/13, unable to walk on it. Per dr Ronald Lobo will be set up, schedulers will be calling him. He is to take tylenol and rest. Patient verbalized understanding.

## 2013-03-09 ENCOUNTER — Other Ambulatory Visit: Payer: Self-pay | Admitting: Oncology

## 2013-03-09 ENCOUNTER — Ambulatory Visit (HOSPITAL_COMMUNITY)
Admission: RE | Admit: 2013-03-09 | Discharge: 2013-03-09 | Disposition: A | Discharge status: Home / Self Care | Payer: BC Managed Care – PPO | Source: Ambulatory Visit | Attending: Oncology | Admitting: Oncology

## 2013-03-09 DIAGNOSIS — C649 Malignant neoplasm of unspecified kidney, except renal pelvis: Secondary | ICD-10-CM

## 2013-03-09 DIAGNOSIS — M5137 Other intervertebral disc degeneration, lumbosacral region: Secondary | ICD-10-CM | POA: Insufficient documentation

## 2013-03-09 DIAGNOSIS — M79609 Pain in unspecified limb: Secondary | ICD-10-CM | POA: Insufficient documentation

## 2013-03-09 DIAGNOSIS — M545 Low back pain, unspecified: Secondary | ICD-10-CM | POA: Insufficient documentation

## 2013-03-09 DIAGNOSIS — M51379 Other intervertebral disc degeneration, lumbosacral region without mention of lumbar back pain or lower extremity pain: Secondary | ICD-10-CM | POA: Insufficient documentation

## 2013-03-09 DIAGNOSIS — M899 Disorder of bone, unspecified: Secondary | ICD-10-CM | POA: Insufficient documentation

## 2013-03-09 DIAGNOSIS — Z905 Acquired absence of kidney: Secondary | ICD-10-CM | POA: Insufficient documentation

## 2013-03-09 DIAGNOSIS — M47817 Spondylosis without myelopathy or radiculopathy, lumbosacral region: Secondary | ICD-10-CM | POA: Insufficient documentation

## 2013-03-09 MED ORDER — GADOBENATE DIMEGLUMINE 529 MG/ML IV SOLN
20.0000 mL | Freq: Once | INTRAVENOUS | Status: AC | PRN
  Administered 2013-03-09: 20 mL via INTRAVENOUS

## 2013-03-15 ENCOUNTER — Encounter: Payer: Self-pay | Admitting: Oncology

## 2013-03-15 NOTE — Progress Notes (Signed)
Put [employer] disability form on nurse's desk.

## 2013-03-16 ENCOUNTER — Other Ambulatory Visit (HOSPITAL_BASED_OUTPATIENT_CLINIC_OR_DEPARTMENT_OTHER): Payer: BC Managed Care – PPO | Admitting: Lab

## 2013-03-16 ENCOUNTER — Telehealth: Payer: Self-pay | Admitting: Oncology

## 2013-03-16 ENCOUNTER — Ambulatory Visit (HOSPITAL_BASED_OUTPATIENT_CLINIC_OR_DEPARTMENT_OTHER): Payer: BC Managed Care – PPO | Admitting: Oncology

## 2013-03-16 VITALS — BP 136/82 | HR 71 | Temp 97.0°F | Resp 20 | Ht 73.0 in | Wt 221.3 lb

## 2013-03-16 DIAGNOSIS — M79609 Pain in unspecified limb: Secondary | ICD-10-CM

## 2013-03-16 DIAGNOSIS — C649 Malignant neoplasm of unspecified kidney, except renal pelvis: Secondary | ICD-10-CM

## 2013-03-16 DIAGNOSIS — C7970 Secondary malignant neoplasm of unspecified adrenal gland: Secondary | ICD-10-CM

## 2013-03-16 DIAGNOSIS — C7800 Secondary malignant neoplasm of unspecified lung: Secondary | ICD-10-CM

## 2013-03-16 DIAGNOSIS — C7889 Secondary malignant neoplasm of other digestive organs: Secondary | ICD-10-CM

## 2013-03-16 LAB — COMPREHENSIVE METABOLIC PANEL (CC13)
ALT: 54 U/L (ref 0–55)
AST: 29 U/L (ref 5–34)
Albumin: 3.9 g/dL (ref 3.5–5.0)
Alkaline Phosphatase: 112 U/L (ref 40–150)
BUN: 15 mg/dL (ref 7.0–26.0)
CO2: 29 mEq/L (ref 22–29)
Calcium: 9.6 mg/dL (ref 8.4–10.4)
Chloride: 100 mEq/L (ref 98–107)
Creatinine: 1.5 mg/dL — ABNORMAL HIGH (ref 0.7–1.3)
Glucose: 118 mg/dl — ABNORMAL HIGH (ref 70–99)
Potassium: 4.6 mEq/L (ref 3.5–5.1)
Sodium: 138 mEq/L (ref 136–145)
Total Bilirubin: 0.43 mg/dL (ref 0.20–1.20)
Total Protein: 7.8 g/dL (ref 6.4–8.3)

## 2013-03-16 LAB — CBC WITH DIFFERENTIAL/PLATELET
BASO%: 0.5 % (ref 0.0–2.0)
Basophils Absolute: 0 10*3/uL (ref 0.0–0.1)
EOS%: 1.4 % (ref 0.0–7.0)
Eosinophils Absolute: 0.1 10*3/uL (ref 0.0–0.5)
HCT: 46.1 % (ref 38.4–49.9)
HGB: 15.9 g/dL (ref 13.0–17.1)
LYMPH%: 31.3 % (ref 14.0–49.0)
MCH: 36.1 pg — ABNORMAL HIGH (ref 27.2–33.4)
MCHC: 34.5 g/dL (ref 32.0–36.0)
MCV: 104.7 fL — ABNORMAL HIGH (ref 79.3–98.0)
MONO#: 0.3 10*3/uL (ref 0.1–0.9)
MONO%: 5 % (ref 0.0–14.0)
NEUT#: 4 10*3/uL (ref 1.5–6.5)
NEUT%: 61.8 % (ref 39.0–75.0)
Platelets: 145 10*3/uL (ref 140–400)
RBC: 4.41 10*6/uL (ref 4.20–5.82)
RDW: 15.3 % — ABNORMAL HIGH (ref 11.0–14.6)
WBC: 6.5 10*3/uL (ref 4.0–10.3)
lymph#: 2 10*3/uL (ref 0.9–3.3)

## 2013-03-16 MED ORDER — ALPRAZOLAM 1 MG PO TABS: 1.0000 mg | ORAL_TABLET | Freq: Three times a day (TID) | ORAL | Status: DC | PRN

## 2013-03-16 MED ORDER — OXYCODONE HCL 5 MG PO TABS: 5.0000 mg | ORAL_TABLET | ORAL | Status: DC | PRN

## 2013-03-16 NOTE — Progress Notes (Signed)
Hematology and Oncology Follow Up Visit  Keith [name] [id number] [birth year] 46 y.o. 03/16/2013 10:07 AM  Keith Bring, MD  Keith [name], M.D.  Keith Bent, MD  Keith Banister, MD    Principle Diagnosis: This is a 47 year old gentleman with stage IV renal cell carcinoma diagnosed in 2009.  Prior Therapy: 1. Status post laparoscopic radical nephrectomy.  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 2. Patient status post thoracotomy done October 2009.  He had a lower lobe nodule, biopsy proven to be metastatic renal cell carcinoma.   3. Patient is status post stereotactic radiotherapy to pulmonary nodules in May of 2010.  Current therapy: He is on Sutent 50 mg 4 weeks on 2 weeks off since November of 2011, has stable disease. His disease include pancreatic involvement with RCC.  Interim History:  Keith [name] presents today for a followup visit.  He has tolerated Sutent very well without any complications this month.  Reporting discomfort all around his body. Started ain the last few weeks. His pain is rather diffuse and difficult to pinpoint. He reports some weakness in his left leg. He report overall decline in his energy and stamina and has not been able to work for the last 3 weeks. He is not reporting any abdominal pain. He did report grade 1-2 hand-foot syndrome. His pain in the his feet as well as the ankles. Has not reported any other complications. He is no longer taking Cymbalta.  He is not reporting any neurological symptoms but reports headaches at times.   Medications: I have reviewed the patient's current medications. Current outpatient prescriptions:acetaminophen (TYLENOL) 325 MG tablet, Take 650 mg by mouth as needed.  , Disp: , Rfl: ;  ALPRAZolam (XANAX) 1 MG tablet, Take 1 tablet (1 mg total) by mouth every 8 (eight) hours as needed. Anxiety, Disp: 30 tablet, Rfl: 1;  DULoxetine (CYMBALTA) 60 MG capsule, Take 60 mg by mouth daily., Disp: , Rfl: ;   gabapentin (NEURONTIN) 300 MG capsule, TAKE 1 CAPSULE BY MOUTH AT BEDTIME, Disp: 60 capsule, Rfl: 1 ibuprofen (ADVIL,MOTRIN) 200 MG tablet, Take 200 mg by mouth every 6 (six) hours as needed. pain, Disp: , Rfl: ;  oxyCODONE-acetaminophen (ROXICET) 5-325 MG per tablet, Take 1 tablet by mouth every 6 (six) hours as needed for pain., Disp: 30 tablet, Rfl: 0;  pseudoephedrine-acetaminophen (TYLENOL SINUS) 30-500 MG TABS, Take 1 tablet by mouth every 4 (four) hours as needed., Disp: , Rfl:  SUNItinib (SUTENT) 50 MG capsule, Take 1 capsule (50 mg total) by mouth daily. Take 1 capsule daily X 28 days; off 14 days; restart, Disp: 28 capsule, Rfl: 1;  oxyCODONE (OXY IR/ROXICODONE) 5 MG immediate release tablet, Take 1 tablet (5 mg total) by mouth every 4 (four) hours as needed for pain., Disp: 30 tablet, Rfl: 0  Allergies:  Allergies  Allergen Reactions  . Ceftriaxone Hives  . Hydrocodone Swelling    Past Medical History, Surgical history, Social history, and Family History were reviewed and updated.  Review of Systems: Constitutional:  Negative for fever, chills, night sweats, anorexia, weight loss, pain. Cardiovascular: no chest pain or dyspnea on exertion Respiratory: no cough, shortness of breath, or wheezing Neurological: no TIA or stroke symptoms Dermatological: negative ENT: negative Skin: Negative. Gastrointestinal: no abdominal pain, change in bowel habits, or black or bloody stools Genito-Urinary: no dysuria, trouble voiding, or hematuria Hematological and Lymphatic: negative Breast: negative Musculoskeletal: negative for - joint swelling, muscle pain or pain in foot -  bilateral Remaining ROS negative.  Physical Exam: Blood pressure 136/82, pulse 71, temperature 97 F (36.1 C), temperature source Oral, resp. rate 20, height 6\' 1"  (1.854 m), weight 221 lb 4.8 oz (100.381 kg). ECOG: 1 General appearance: alert Head: Normocephalic, without obvious abnormality, atraumatic Neck: no  adenopathy, no carotid bruit, no JVD, supple, symmetrical, trachea midline and thyroid not enlarged, symmetric, no tenderness/mass/nodules Lymph nodes: Cervical, supraclavicular, and axillary nodes normal. Heart:regular rate and rhythm, S1, S2 normal, no murmur, click, rub or gallop Lung:chest clear, no wheezing, rales, normal symmetric air entry. Chest wall pain is reproducible with pressure. Abdomen: soft, non-tender, without masses or organomegaly EXT:no edema, no despumation. No erythema noted. Tender to touch at the ankle and shin area on both sides.   Neuro: no focal deficits noted. No weakness noted in his LE  Lab Results: Lab Results  Component Value Date   WBC 6.5 03/16/2013   HGB 15.9 03/16/2013   HCT 46.1 03/16/2013   MCV 104.7* 03/16/2013   PLT 145 03/16/2013     Chemistry      Component Value Date/Time   NA 138 03/16/2013 0850   NA 136 10/15/2012 1831   NA 139 04/30/2012 1134   K 4.6 03/16/2013 0850   K 4.9 10/15/2012 1831   K 4.3 04/30/2012 1134   CL 100 03/16/2013 0850   CL 98 10/15/2012 1831   CL 96* 04/30/2012 1134   CO2 29 03/16/2013 0850   CO2 29 10/15/2012 1831   CO2 29 04/30/2012 1134   BUN 15.0 03/16/2013 0850   BUN 18 10/15/2012 1831   BUN 14 04/30/2012 1134   CREATININE 1.5* 03/16/2013 0850   CREATININE 1.31 10/15/2012 1831   CREATININE 1.4* 04/30/2012 1134      Component Value Date/Time   CALCIUM 9.6 03/16/2013 0850   CALCIUM 10.0 10/15/2012 1831   CALCIUM 8.5 04/30/2012 1134   ALKPHOS 112 03/16/2013 0850   ALKPHOS 77 07/16/2012 1122   ALKPHOS 93* 04/30/2012 1134   AST 29 03/16/2013 0850   AST 21 07/16/2012 1122   AST 36 04/30/2012 1134   ALT 54 03/16/2013 0850   ALT 34 07/16/2012 1122   BILITOT 0.43 03/16/2013 0850   BILITOT 0.4 07/16/2012 1122   BILITOT 0.70 04/30/2012 1134     MRI PELVIS WITHOUT AND WITH CONTRAST  Technique: Multiplanar multisequence MR imaging of the pelvis was  performed both before and after administration of intravenous  contrast.   Contrast: 20 ml Multihance  Comparison: Multiple exams, including 03/09/2013 and 12/03/2012  Findings: Eight T2 hyperintense, enhancing lesion in the right  sacrum is observed with the overall region of abnormal signal  measuring 4.4 x 3.2 x 3.5 cm. This extends near the right S1  foramen but does not obviously invade the foramen or extend beyond  the bony cortex. No transverse edema in the sacrum, or other bony  pelvic enhancing lesions noted. No invasion across the sacroiliac  joint on the right.  Pubic symphysis appears normal. Proximal hamstring tendons  unremarkable. The hip adductor musculature appears normal.  No findings of avascular necrosis or hip effusion. No regional  bursitis. No sciatic notch or obturator impingement noted. No  regional soft tissue metastatic disease.  IMPRESSION:  1. Enhancing mass in the right sacrum most compatible with osseous  metastatic disease. No extraosseous extension.    Impression and Plan:  This is a pleasant 47 year old gentleman with the following issues. 1. Metastatic renal cell carcinoma.  He has documented disease to  the lung and the pancreas.  He tolerating Sutent well. He is reporting diffuse pains without any clear source and could be a sign of progression. I will restage him with CT scan and MRI of the brain and consider switching to Votrient if that is the case. Risks and benefits discussed today and he understands the need to switch at this time.  2. Pancreatic thickening/mass.  This has been biopsy proven to be a renal cell metastasis. No change in treatment at this time. 3. Depression/Anxiety: More irritable. Plans to restart Cymbalta. 4. Foot/lower legs  pain: unclear etiology. MRI showed a mass on the right side and with most of his pain on the left side. I will refer him to Dr. Tammi Klippel for an opinion for possible consideration of radiation to the right sacral mass. 5. Rib discomfort/body aches. Likely musculoskeletal in nature. I  gave him oxycodone Rx with clear instructions to follow. He understands if he gets any pain medication from other providers or not follow my instructions, that I will not be prescribing any pain medication for him.  6. Follow up: 4 weeks.  Tahani Potier 3/26/201410:07 AM

## 2013-03-17 ENCOUNTER — Encounter: Payer: Self-pay | Admitting: Oncology

## 2013-03-17 NOTE — Progress Notes (Signed)
Faxed disability form to [employer] @ [phone] °

## 2013-03-21 ENCOUNTER — Encounter: Payer: Self-pay | Admitting: Radiation Oncology

## 2013-03-21 ENCOUNTER — Telehealth: Payer: Self-pay | Admitting: Dietician

## 2013-03-22 ENCOUNTER — Encounter: Payer: Self-pay | Admitting: Radiation Oncology

## 2013-03-22 ENCOUNTER — Ambulatory Visit (HOSPITAL_COMMUNITY)
Admission: RE | Admit: 2013-03-22 | Discharge: 2013-03-22 | Disposition: A | Discharge status: Home / Self Care | Payer: BC Managed Care – PPO | Source: Ambulatory Visit | Attending: Oncology | Admitting: Oncology

## 2013-03-22 ENCOUNTER — Other Ambulatory Visit: Payer: BC Managed Care – PPO | Admitting: Lab

## 2013-03-22 DIAGNOSIS — Z905 Acquired absence of kidney: Secondary | ICD-10-CM | POA: Insufficient documentation

## 2013-03-22 DIAGNOSIS — D1802 Hemangioma of intracranial structures: Secondary | ICD-10-CM | POA: Insufficient documentation

## 2013-03-22 DIAGNOSIS — R51 Headache: Secondary | ICD-10-CM | POA: Insufficient documentation

## 2013-03-22 DIAGNOSIS — K869 Disease of pancreas, unspecified: Secondary | ICD-10-CM | POA: Insufficient documentation

## 2013-03-22 DIAGNOSIS — R911 Solitary pulmonary nodule: Secondary | ICD-10-CM | POA: Insufficient documentation

## 2013-03-22 DIAGNOSIS — J984 Other disorders of lung: Secondary | ICD-10-CM | POA: Insufficient documentation

## 2013-03-22 DIAGNOSIS — M948X9 Other specified disorders of cartilage, unspecified sites: Secondary | ICD-10-CM | POA: Insufficient documentation

## 2013-03-22 DIAGNOSIS — K8689 Other specified diseases of pancreas: Secondary | ICD-10-CM | POA: Insufficient documentation

## 2013-03-22 DIAGNOSIS — C649 Malignant neoplasm of unspecified kidney, except renal pelvis: Secondary | ICD-10-CM

## 2013-03-22 DIAGNOSIS — C7951 Secondary malignant neoplasm of bone: Secondary | ICD-10-CM | POA: Insufficient documentation

## 2013-03-22 MED ORDER — IOHEXOL 300 MG/ML  SOLN
100.0000 mL | Freq: Once | INTRAMUSCULAR | Status: AC | PRN
  Administered 2013-03-22: 100 mL via INTRAVENOUS

## 2013-03-22 MED ORDER — GADOBENATE DIMEGLUMINE 529 MG/ML IV SOLN
20.0000 mL | Freq: Once | INTRAVENOUS | Status: AC | PRN
  Administered 2013-03-22: 20 mL via INTRAVENOUS

## 2013-03-22 NOTE — Progress Notes (Signed)
Radiation Oncology         [phone] ________________________________  Name: Keith [name] MRN: [id number]  Date: 03/23/2013  DOB: 12-25-1965  Follow-Up Visit Note  CC: [name], MD  [name], MD  Diagnosis:   47 year old gentleman with metastatic renal cell carcinoma status post stereotactic body radiotherapy to three isolated pulmonary metastases (1.2 cm in the right upper lobe, 5 mm in the right upper lobe and 1 cm in the right lung base) in May 2011. These targets received 50 Gy in five fractions of 10 Gy.  Interval Since Last Radiation:  33  months  Narrative:  The patient has been receiving Sutent therapy with relatively good tolerance. He has been suffering with pain primarily along his left side and underwent restaging studies. The studies revealed a right sacral mass for which the patient has been referred for consideration of potential palliative radiation.  ALLERGIES:  is allergic to ceftriaxone and hydrocodone.  Meds: Current Outpatient Prescriptions  Medication Sig Dispense Refill  . acetaminophen (TYLENOL) 325 MG tablet Take 650 mg by mouth as needed.        . ALPRAZolam (XANAX) 1 MG tablet Take 1 tablet (1 mg total) by mouth every 8 (eight) hours as needed. Anxiety  30 tablet  1  . DULoxetine (CYMBALTA) 60 MG capsule Take 60 mg by mouth daily.      [name] gabapentin (NEURONTIN) 300 MG capsule TAKE 1 CAPSULE BY MOUTH AT BEDTIME  60 capsule  1  . ibuprofen (ADVIL,MOTRIN) 200 MG tablet Take 200 mg by mouth every 6 (six) hours as needed. pain      . oxyCODONE (OXY IR/ROXICODONE) 5 MG immediate release tablet Take 1 tablet (5 mg total) by mouth every 4 (four) hours as needed for pain.  30 tablet  0  . SUNItinib (SUTENT) 50 MG capsule Take 1 capsule (50 mg total) by mouth daily. Take 1 capsule daily X 28 days; off 14 days; restart  28 capsule  1  . oxyCODONE-acetaminophen (ROXICET) 5-325 MG per tablet Take 1 tablet by mouth every 6 (six) hours as needed for  pain.  30 tablet  0  . pseudoephedrine-acetaminophen (TYLENOL SINUS) 30-500 MG TABS Take 1 tablet by mouth every 4 (four) hours as needed.       No current facility-administered medications for this encounter.    Physical Findings: The patient is in no acute distress. Patient is alert and oriented.  height is 6\' 3"  (1.905 m) and weight is 222 lb 1.6 oz (100.744 kg). His oral temperature is 97.4 F (36.3 C). His blood pressure is 125/91 and his pulse is 72. His respiration is 20. [name]  No significant changes.  Lab Findings: Lab Results  Component Value Date   WBC 6.5 03/16/2013   HGB 15.9 03/16/2013   HCT 46.1 03/16/2013   MCV 104.7* 03/16/2013   PLT 145 03/16/2013    @LASTCHEM @  Radiographic Findings: Ct Chest W Contrast  03/22/2013  *RADIOLOGY REPORT*  Clinical Data:  Kidney cancer.  Hypervascular pancreatic mass.  CT CHEST, ABDOMEN AND PELVIS WITH CONTRAST  Technique:  Multidetector CT imaging of the chest, abdomen and pelvis was performed following the standard protocol during bolus administration of intravenous contrast.  Contrast: 180mL OMNIPAQUE IOHEXOL 300 MG/ML  SOLN  Comparison:  CT of the chest abdomen and pelvis 12/03/2012.  CT CHEST  Findings:  Mediastinum: There is a small amount of low attenuation material along the posterior aspect of the proximal right mainstem bronchus, favored  to represent retained secretions. Heart size is normal. There is no significant pericardial fluid, thickening or pericardial calcification. No pathologically enlarged mediastinal or hilar lymph nodes. Esophagus is unremarkable in appearance.  Lungs/Pleura: Prominent infrahilar soft tissue around the proximal aspects of the medial and lateral segment bronchi to the right middle lobe is again noted, measuring approximately 2.1 x 1.7 cm on today's examination (image 61 of series 6), appearing very similar to the prior examination.  This again has spiculated margins and is associated with some focal scarring.   Postoperative changes of right lower lobe wedge resection are redemonstrated.  A chronic area of scarring in the medial aspect of the right upper lobe is similar to prior examinations, possibly related to prior radiation therapy.  No new suspicious appearing pulmonary nodules or masses are identified.  Scattered areas of mild pleural thickening are noted in the right hemithorax.  No significant pleural effusions.  Musculoskeletal: Old healed posterior right seventh rib fracture incidentally noted. There are no aggressive appearing lytic or blastic lesions noted in the visualized portions of the skeleton.  IMPRESSION:  1.  No significant change in pulmonary nodule between the medial and lateral segmental bronchi to the right middle lobe, as above. 2.  No new pulmonary nodules are noted. 3.  Postoperative and post treatment related changes redemonstrated, as above. 4.  Additional incidental findings, as above.  CT ABDOMEN AND PELVIS  Findings:  Abdomen/Pelvis: Again noted are multiple hypervascular enhancing pancreatic lesions.  The largest of these lesions are located in the head of the pancreas (13 x 12 mm on image 45 of series 2) and in the body of the pancreas (2.4 x 1.8 cm on image 36 of series 2). These lesions appear to cause some degree of ductal obstruction as evidenced by ductal dilatation (pancreatic duct measures up to 8 mm in diameter in the proximal body of the pancreas and 6 mm in diameter of the tail).  No definite peripancreatic lymphadenopathy or extension of these masses into adjacent soft tissues.  A subcentimeter low attenuation lesion in segment 5 of the liver is too small to characterize but is unchanged and favored to be benign.  No other new hepatic lesions are noted.  The appearance of the gallbladder, spleen and bilateral adrenal glands is unremarkable.  Status post left nephrectomy.  There is a focal contour abnormality of the lateral aspect of the lower pole of the right kidney where there  is a focal area measuring approximately 12 x 8 mm which demonstrates potential enhancement; however, this is too small to definitively characterize by CT.  No significant volume of ascites.  No pneumoperitoneum.  No pathologic distension of small bowel.  Normal appendix.  Prostate and urinary bladder are unremarkable in appearance.  Musculoskeletal: Ill-defined area of mixed lucency and sclerosis in the right sacral ala is compatible with known metastatic lesion (see MRI of the pelvis 03/09/2013 for further details).  No other aggressive appearing lytic or blastic lesions are otherwise noted in the visualized skeleton.  IMPRESSION:  1.  Multiple hypervascular pancreatic masses suspicious for metastatic disease appears slightly larger than the prior examination, with the largest lesion in the body of the pancreas measuring 2.4 x 1.8 cm on today's study. 2.  These pancreatic lesions are again associated with some mild pancreatic ductal dilatation which appears unchanged. 3.  Mixed lytic and sclerotic lesion in the right sacral ala compatible with bony metastasis, better demonstrated on the MRI of the pelvis 03/09/2013. 4.  Normal  appendix. 5.  Additional incidental findings, as above.   Original Report Authenticated By: Vinnie Langton, M.D.    Mr Jeri Cos Wo Contrast  03/22/2013  *RADIOLOGY REPORT*  Clinical Data: Renal cell cancer.  Osseous metastatic disease. Headaches.  MRI HEAD WITHOUT AND WITH CONTRAST  Technique:  Multiplanar, multiecho pulse sequences of the brain and surrounding structures were obtained according to standard protocol without and with intravenous contrast  Contrast: 19mL MULTIHANCE GADOBENATE DIMEGLUMINE 529 MG/ML IV SOLN  Comparison: None.  Findings: No acute stroke or hemorrhage.  No mass lesion or hydrocephalus.  Post infusion, no abnormal intracranial enhancement except for a left cerebellar venous angioma. Normal cerebral volume.  Slight periventricular white matter signal abnormality,  nonspecific.  No skull base, calvarial, or upper cervical osseous disease.  Major intracranial vascular structures widely patent.  No midline abnormality.  Negative orbits, sinuses, and mastoids.  IMPRESSION: No evidence for intracranial metastatic disease from renal cell cancer.  No evidence for calvarial or skull base osseous disease.   Original Report Authenticated By: Rolla Flatten, M.D.    Mr Lumbar Spine W Wo Contrast  03/09/2013  *RADIOLOGY REPORT*  Clinical Data: Renal cell carcinoma.  Left back and leg pain.  MRI LUMBAR SPINE WITHOUT AND WITH CONTRAST  Technique:  Multiplanar and multiecho pulse sequences of the lumbar spine were obtained without and with intravenous contrast.  Contrast: 65mL MULTIHANCE GADOBENATE DIMEGLUMINE 529 MG/ML IV SOLN  Comparison: 12/03/2012  Findings: Abnormal T2 hyperintense enhancing lesion in the right side of the sacrum measures 2.8 x 2.1 cm on axial images, and is highly suspicious for osseous metastatic disease.  Mild degenerative endplate findings noted along the right inferior endplate of L4. The lowest full intervertebral disk space is labeled L5-S1.  If procedural intervention is to be performed, careful correlation with this numbering strategy is recommended.  The conus medullaris appears unremarkable.  Conus level:  L1. Small L1 vertebral body hemangioma noted. No significant vertebral subluxation.  Left kidney absent.  Additional findings at individual levels are as follows:  L1-2:  Unremarkable.  L2-3:  No impingement.  Mild disc bulge.  L3-4:  Borderline bilateral foraminal stenosis and borderline central stenosis due to disc bulge.  L4-5:  Moderate central stenosis, mild left and borderline right subarticular lateral recess stenosis, and borderline bilateral foraminal stenosis secondary to disc bulge, central disc protrusion, and facet arthropathy.  L5-S1:  Unremarkable.  IMPRESSION:  1. In the context of the patient's clinical history, the 2.8 x 2.1 cm right  sacral lesion is highly concerning for osseous metastatic disease.  Stress fracture is a significantly less likely differential diagnostic consideration. 2.  Lumbar spondylosis and degenerative disc disease, causing moderate impingement at L4-5.   Original Report Authenticated By: Van Clines, M.D.    Mr Pelvis W Wo Contrast  03/09/2013  *RADIOLOGY REPORT*  Clinical Data: Metastatic renal cell carcinoma.  Left low back pain.  MRI PELVIS WITHOUT AND WITH CONTRAST  Technique:  Multiplanar multisequence MR imaging of the pelvis was performed both before and after administration of intravenous contrast.  Contrast:  20 ml Multihance  Comparison: Multiple exams, including 03/09/2013 and 12/03/2012  Findings: Eight T2 hyperintense, enhancing lesion in the right sacrum is observed with the overall region of abnormal signal measuring 4.4 x 3.2 x 3.5 cm.  This extends near the right S1 foramen but does not obviously invade the foramen or extend beyond the bony cortex.  No transverse edema in the sacrum, or other bony pelvic enhancing lesions  noted.  No invasion across the sacroiliac joint on the right.  Pubic symphysis appears normal.  Proximal hamstring tendons unremarkable.  The hip adductor musculature appears normal.  No findings of avascular necrosis or hip effusion.  No regional bursitis.  No sciatic notch or obturator impingement noted.  No regional soft tissue metastatic disease.  IMPRESSION:  1.  Enhancing mass in the right sacrum most compatible with osseous metastatic disease.  No extraosseous extension.   Original Report Authenticated By: Van Clines, M.D.    Ct Abdomen Pelvis W Contrast  03/22/2013  *RADIOLOGY REPORT*  Clinical Data:  Kidney cancer.  Hypervascular pancreatic mass.  CT CHEST, ABDOMEN AND PELVIS WITH CONTRAST  Technique:  Multidetector CT imaging of the chest, abdomen and pelvis was performed following the standard protocol during bolus administration of intravenous contrast.   Contrast: 130mL OMNIPAQUE IOHEXOL 300 MG/ML  SOLN  Comparison:  CT of the chest abdomen and pelvis 12/03/2012.  CT CHEST  Findings:  Mediastinum: There is a small amount of low attenuation material along the posterior aspect of the proximal right mainstem bronchus, favored to represent retained secretions. Heart size is normal. There is no significant pericardial fluid, thickening or pericardial calcification. No pathologically enlarged mediastinal or hilar lymph nodes. Esophagus is unremarkable in appearance.  Lungs/Pleura: Prominent infrahilar soft tissue around the proximal aspects of the medial and lateral segment bronchi to the right middle lobe is again noted, measuring approximately 2.1 x 1.7 cm on today's examination (image 61 of series 6), appearing very similar to the prior examination.  This again has spiculated margins and is associated with some focal scarring.  Postoperative changes of right lower lobe wedge resection are redemonstrated.  A chronic area of scarring in the medial aspect of the right upper lobe is similar to prior examinations, possibly related to prior radiation therapy.  No new suspicious appearing pulmonary nodules or masses are identified.  Scattered areas of mild pleural thickening are noted in the right hemithorax.  No significant pleural effusions.  Musculoskeletal: Old healed posterior right seventh rib fracture incidentally noted. There are no aggressive appearing lytic or blastic lesions noted in the visualized portions of the skeleton.  IMPRESSION:  1.  No significant change in pulmonary nodule between the medial and lateral segmental bronchi to the right middle lobe, as above. 2.  No new pulmonary nodules are noted. 3.  Postoperative and post treatment related changes redemonstrated, as above. 4.  Additional incidental findings, as above.  CT ABDOMEN AND PELVIS  Findings:  Abdomen/Pelvis: Again noted are multiple hypervascular enhancing pancreatic lesions.  The largest of  these lesions are located in the head of the pancreas (13 x 12 mm on image 45 of series 2) and in the body of the pancreas (2.4 x 1.8 cm on image 36 of series 2). These lesions appear to cause some degree of ductal obstruction as evidenced by ductal dilatation (pancreatic duct measures up to 8 mm in diameter in the proximal body of the pancreas and 6 mm in diameter of the tail).  No definite peripancreatic lymphadenopathy or extension of these masses into adjacent soft tissues.  A subcentimeter low attenuation lesion in segment 5 of the liver is too small to characterize but is unchanged and favored to be benign.  No other new hepatic lesions are noted.  The appearance of the gallbladder, spleen and bilateral adrenal glands is unremarkable.  Status post left nephrectomy.  There is a focal contour abnormality of the lateral aspect of the  lower pole of the right kidney where there is a focal area measuring approximately 12 x 8 mm which demonstrates potential enhancement; however, this is too small to definitively characterize by CT.  No significant volume of ascites.  No pneumoperitoneum.  No pathologic distension of small bowel.  Normal appendix.  Prostate and urinary bladder are unremarkable in appearance.  Musculoskeletal: Ill-defined area of mixed lucency and sclerosis in the right sacral ala is compatible with known metastatic lesion (see MRI of the pelvis 03/09/2013 for further details).  No other aggressive appearing lytic or blastic lesions are otherwise noted in the visualized skeleton.  IMPRESSION:  1.  Multiple hypervascular pancreatic masses suspicious for metastatic disease appears slightly larger than the prior examination, with the largest lesion in the body of the pancreas measuring 2.4 x 1.8 cm on today's study. 2.  These pancreatic lesions are again associated with some mild pancreatic ductal dilatation which appears unchanged. 3.  Mixed lytic and sclerotic lesion in the right sacral ala compatible  with bony metastasis, better demonstrated on the MRI of the pelvis 03/09/2013. 4.  Normal appendix. 5.  Additional incidental findings, as above.   Original Report Authenticated By: Vinnie Langton, M.D.     Impression:  The patient has progression of a right sacral metastasis which may be amenable to radiotherapy for palliation of pain.  However, most of his pain is left-sided, which is not well-explained.  Plan:  Today, I talked to the patient and family about the findings and work-up thus far.  We discussed the natural history of disease and general treatment, highlighting the role or radiotherapy in the management.    We may decide to radiate the right sacral met, but, will proceed with t-spine MRI and bone scan first to try to better characterize the etiology of his left-sided pain.  I spent 30 minutes minutes face to face with the patient and more than 50% of that time was spent in counseling and/or coordination of care.   _____________________________________  Sheral Apley. Tammi Klippel, M.D.

## 2013-03-23 ENCOUNTER — Ambulatory Visit
Admission: RE | Admit: 2013-03-23 | Discharge: 2013-03-23 | Disposition: A | Discharge status: Home / Self Care | Payer: BC Managed Care – PPO | Source: Ambulatory Visit | Attending: Radiation Oncology | Admitting: Radiation Oncology

## 2013-03-23 ENCOUNTER — Telehealth: Payer: Self-pay | Admitting: *Deleted

## 2013-03-23 ENCOUNTER — Encounter: Payer: Self-pay | Admitting: Oncology

## 2013-03-23 ENCOUNTER — Encounter: Payer: Self-pay | Admitting: Radiation Oncology

## 2013-03-23 VITALS — BP 125/91 | HR 72 | Temp 97.4°F | Resp 20 | Ht 75.0 in | Wt 222.1 lb

## 2013-03-23 DIAGNOSIS — C419 Malignant neoplasm of bone and articular cartilage, unspecified: Secondary | ICD-10-CM

## 2013-03-23 DIAGNOSIS — C649 Malignant neoplasm of unspecified kidney, except renal pelvis: Secondary | ICD-10-CM

## 2013-03-23 DIAGNOSIS — C7800 Secondary malignant neoplasm of unspecified lung: Secondary | ICD-10-CM | POA: Insufficient documentation

## 2013-03-23 HISTORY — DX: Anxiety disorder, unspecified: F41.9

## 2013-03-23 NOTE — Progress Notes (Signed)
Faxed clinical note from 03/16/13 to [facility] @ [id number].

## 2013-03-23 NOTE — Progress Notes (Signed)
Please see the Nurse Progress Note in the MD Initial Consult Encounter for this patient. 

## 2013-03-23 NOTE — Progress Notes (Addendum)
FOLLOW UP RECON Stage IV Renal Cell Ca with pulmoanry mets Radiation therapy May 17,19,& May 24,2011  MRI 03/22/13: Ct Chest/abd/pelvis = pancreatic masses larger than prior exam, kmixed lytic and sclerotic lesion right sacral ala compatible with bony metastases MRI 03/22/13: Brain results neg    Alert,orioented x3, pain lin left hip radiating down left leg,  Pain 6-7 on 1-10 pain scale, last pain meds taken yesterday, taking Sutent daily  Patient stated "Dr.Shadad is going to cahnge my chemo", takes imodium prn diarrhea from Sutent   Allergies"Ceftin,Hydrocodone,  No History of a Pacemaker

## 2013-03-23 NOTE — Telephone Encounter (Signed)
CALLED PATIENT TO INFORM OF TESTS AND FU VISITS, SPOKE WITH PATIENT AND HE IS AWARE OF THESE APPTS.

## 2013-03-24 ENCOUNTER — Ambulatory Visit: Payer: BC Managed Care – PPO | Admitting: Radiation Oncology

## 2013-03-24 ENCOUNTER — Ambulatory Visit: Payer: BC Managed Care – PPO

## 2013-03-25 ENCOUNTER — Other Ambulatory Visit: Payer: Self-pay | Admitting: Oncology

## 2013-03-25 ENCOUNTER — Other Ambulatory Visit: Payer: Self-pay | Admitting: *Deleted

## 2013-03-25 DIAGNOSIS — C649 Malignant neoplasm of unspecified kidney, except renal pelvis: Secondary | ICD-10-CM

## 2013-03-25 MED ORDER — PAZOPANIB HCL 200 MG PO TABS: 800.0000 mg | ORAL_TABLET | Freq: Every day | ORAL | Status: DC

## 2013-03-25 NOTE — Telephone Encounter (Signed)
Faxed signed script for votrient to [facility] [phone]

## 2013-04-05 ENCOUNTER — Encounter (HOSPITAL_COMMUNITY)
Admission: RE | Admit: 2013-04-05 | Discharge: 2013-04-05 | Disposition: A | Discharge status: Home / Self Care | Payer: BC Managed Care – PPO | Source: Ambulatory Visit | Attending: Radiation Oncology | Admitting: Radiation Oncology

## 2013-04-05 ENCOUNTER — Other Ambulatory Visit: Payer: Self-pay | Admitting: Radiation Oncology

## 2013-04-05 ENCOUNTER — Encounter: Payer: Self-pay | Admitting: Oncology

## 2013-04-05 ENCOUNTER — Ambulatory Visit (HOSPITAL_COMMUNITY)
Admission: RE | Admit: 2013-04-05 | Discharge: 2013-04-05 | Disposition: A | Discharge status: Home / Self Care | Payer: BC Managed Care – PPO | Source: Ambulatory Visit | Attending: Radiation Oncology | Admitting: Radiation Oncology

## 2013-04-05 DIAGNOSIS — R609 Edema, unspecified: Secondary | ICD-10-CM | POA: Insufficient documentation

## 2013-04-05 DIAGNOSIS — C7951 Secondary malignant neoplasm of bone: Secondary | ICD-10-CM | POA: Insufficient documentation

## 2013-04-05 DIAGNOSIS — C649 Malignant neoplasm of unspecified kidney, except renal pelvis: Secondary | ICD-10-CM

## 2013-04-05 DIAGNOSIS — C419 Malignant neoplasm of bone and articular cartilage, unspecified: Secondary | ICD-10-CM

## 2013-04-05 DIAGNOSIS — M549 Dorsalgia, unspecified: Secondary | ICD-10-CM | POA: Insufficient documentation

## 2013-04-05 DIAGNOSIS — C7952 Secondary malignant neoplasm of bone marrow: Secondary | ICD-10-CM | POA: Insufficient documentation

## 2013-04-05 DIAGNOSIS — Z85528 Personal history of other malignant neoplasm of kidney: Secondary | ICD-10-CM | POA: Insufficient documentation

## 2013-04-05 MED ORDER — TECHNETIUM TC 99M MEDRONATE IV KIT
26.3000 | PACK | Freq: Once | INTRAVENOUS | Status: AC | PRN
  Administered 2013-04-05: 26.3 via INTRAVENOUS

## 2013-04-05 MED ORDER — GADOBENATE DIMEGLUMINE 529 MG/ML IV SOLN
20.0000 mL | Freq: Once | INTRAVENOUS | Status: AC | PRN
  Administered 2013-04-05: 20 mL via INTRAVENOUS

## 2013-04-05 NOTE — Progress Notes (Signed)
Trenda Moots Flowers @ [facility] [id number] ext [phone] informed her that Mr. Pines will not be returning to work and it was written on his disability form; will refax disability form along with office notes.

## 2013-04-06 ENCOUNTER — Encounter: Payer: Self-pay | Admitting: Radiation Oncology

## 2013-04-07 ENCOUNTER — Ambulatory Visit
Admission: RE | Admit: 2013-04-07 | Discharge: 2013-04-07 | Disposition: A | Discharge status: Home / Self Care | Payer: BC Managed Care – PPO | Source: Ambulatory Visit | Attending: Radiation Oncology | Admitting: Radiation Oncology

## 2013-04-07 ENCOUNTER — Encounter: Payer: Self-pay | Admitting: Radiation Oncology

## 2013-04-07 VITALS — BP 137/95 | HR 60 | Temp 97.7°F | Resp 16 | Ht 75.0 in | Wt 224.0 lb

## 2013-04-07 DIAGNOSIS — C419 Malignant neoplasm of bone and articular cartilage, unspecified: Secondary | ICD-10-CM

## 2013-04-07 DIAGNOSIS — C649 Malignant neoplasm of unspecified kidney, except renal pelvis: Secondary | ICD-10-CM | POA: Insufficient documentation

## 2013-04-07 DIAGNOSIS — Z923 Personal history of irradiation: Secondary | ICD-10-CM | POA: Insufficient documentation

## 2013-04-07 DIAGNOSIS — C7951 Secondary malignant neoplasm of bone: Secondary | ICD-10-CM | POA: Insufficient documentation

## 2013-04-07 DIAGNOSIS — C7800 Secondary malignant neoplasm of unspecified lung: Secondary | ICD-10-CM | POA: Insufficient documentation

## 2013-04-07 DIAGNOSIS — R109 Unspecified abdominal pain: Secondary | ICD-10-CM | POA: Insufficient documentation

## 2013-04-07 DIAGNOSIS — Z905 Acquired absence of kidney: Secondary | ICD-10-CM | POA: Insufficient documentation

## 2013-04-07 DIAGNOSIS — Z510 Encounter for antineoplastic radiation therapy: Secondary | ICD-10-CM | POA: Insufficient documentation

## 2013-04-07 DIAGNOSIS — C7952 Secondary malignant neoplasm of bone marrow: Secondary | ICD-10-CM | POA: Insufficient documentation

## 2013-04-07 DIAGNOSIS — C642 Malignant neoplasm of left kidney, except renal pelvis: Secondary | ICD-10-CM

## 2013-04-07 MED ORDER — OXYCODONE HCL 5 MG PO TABS: 5.0000 mg | ORAL_TABLET | ORAL | Status: DC | PRN

## 2013-04-07 NOTE — Progress Notes (Signed)
Patient presents to the clinic today for re consult with Dr. Tammi Klippel for consideration of palliative radiation therapy to right sacral mass. Patient alert and oriented to person, place, and time. No distress noted. Steady gait noted. Pleasant affect noted. Patient reports constant left sacrum pain 6 on a scale of 0-10. Patient reports he takes tylenol only occasional for bone pain. Patient has script for oxy IR but, reports it doesn't help. Patient mostly suffers through this bone pain. Patient reports he has been given script for neurontin but, it doesn't help either. Patient reports his left leg remain numb most of the time and his right leg is numb intermittently. Patient denies weakness in either leg. Patient reports pain in his left sacral area is worse upon ambulation. Patient denies nausea, vomiting or dizziness. Patient reports severe intermittent headache since being switched from sutent to votrient. Patient denies night sweat. Patient denies unintentional weight loss but, does report his weight fluctuates between 5-7 lb. Patient reports an excellent appetite. Patient reports difficulty sleeping only recently due to new findings and pain. Patient reports shortness of breath upon exertion. Patient denies cough. Patient denies hemoptysis. Patient reports diarrhea associated with sutent. Reported all findings to Dr. Tammi Klippel.

## 2013-04-07 NOTE — Progress Notes (Signed)
  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  Identity was confirmed.  All relevant records and images related to the planned course of therapy were reviewed.  The patient freely provided informed written consent to proceed with treatment after reviewing the details related to the planned course of therapy. The consent form was witnessed and verified by the simulation staff.  Then, the patient was set-up in a stable reproducible  supine position for radiation therapy.  A BodyFix immobilization pillow was fabricated for reproducible positioning.  Surface markings were placed.  The CT images were loaded into the planning software.  The gross target volumes (GTV) and planning target volumes (PTV) were delinieated, and avoidance structures were contoured.  Treatment planning then occurred.  The radiation prescription was entered and confirmed.  A total of two complex treatment devices were fabricated in the form of the BodyFix immobilization pillow and a neck accuform cushion.  I have requested : 3D Simulation  I have requested a DVH of the following structures: targets and all normal structures near the target as outlined on the radiation plan to maintain doses in adherence with established limits.    SBRT is medically necessary due to the radioresistance of renal cell carcinoma, oligometastatic disease, and excellent patient performance status.  PLAN:  The patient will receive 50 Gy in 5 fractions.  ________________________________  Sheral Apley Tammi Klippel, M.D.

## 2013-04-07 NOTE — Progress Notes (Signed)
Radiation Oncology         [phone]) [phone] ________________________________  Name: Keith [name] MRN: [id number]  Date: 04/07/2013  DOB: Feb 19, 1966  Follow-Up Visit Note  CC: [name], MD  [name],*  Diagnosis:   47 year old gentleman with metastatic renal cell carcinoma status post stereotactic body radiotherapy to three isolated pulmonary metastases (1.2 cm in the right upper lobe, 5 mm in the right upper lobe and 1 cm in the right lung base) in May 2011. These targets received 50 Gy in five fractions of 10 Gy now with an isolated right sacral oligometastatic deposit  Interval Since Last Radiation:  33  months  Narrative:  The patient returns today to follow-up on his scans.  He still has abdominal pain radiating down his leg on the left side.  His scans still only show a solitary definitive bone met in the right sacrum.                              ALLERGIES:  is allergic to ceftriaxone and hydrocodone.  Meds: Current Outpatient Prescriptions  Medication Sig Dispense Refill  . acetaminophen (TYLENOL) 325 MG tablet Take 650 mg by mouth as needed.        . ALPRAZolam (XANAX) 1 MG tablet Take 1 tablet (1 mg total) by mouth every 8 (eight) hours as needed. Anxiety  30 tablet  1  . DULoxetine (CYMBALTA) 60 MG capsule Take 60 mg by mouth daily.      [name] ibuprofen (ADVIL,MOTRIN) 200 MG tablet Take 200 mg by mouth every 6 (six) hours as needed. pain      . oxyCODONE (OXY IR/ROXICODONE) 5 MG immediate release tablet Take 1 tablet (5 mg total) by mouth every 4 (four) hours as needed for pain.  30 tablet  0  . pazopanib (VOTRIENT) 200 MG tablet Take 4 tablets (800 mg total) by mouth daily. Take on an empty stomach.  120 tablet  0  . pseudoephedrine-acetaminophen (TYLENOL SINUS) 30-500 MG TABS Take 1 tablet by mouth every 4 (four) hours as needed.      . gabapentin (NEURONTIN) 300 MG capsule TAKE 1 CAPSULE BY MOUTH AT BEDTIME  60 capsule  1  . oxyCODONE-acetaminophen  (ROXICET) 5-325 MG per tablet Take 1 tablet by mouth every 6 (six) hours as needed for pain.  30 tablet  0   No current facility-administered medications for this encounter.    Physical Findings: The patient is in no acute distress. Patient is alert and oriented.  height is 6\' 3"  (1.905 m) and weight is 224 lb (101.606 kg). His oral temperature is 97.7 F (36.5 C). His blood pressure is 137/95 and his pulse is 60. His respiration is 16 and oxygen saturation is 100%. .  No significant changes.  Lab Findings: Lab Results  Component Value Date   WBC 6.5 03/16/2013   HGB 15.9 03/16/2013   HCT 46.1 03/16/2013   MCV 104.7* 03/16/2013   PLT 145 03/16/2013    @LASTCHEM @  Radiographic Findings: Ct Chest W Contrast  03/22/2013  *RADIOLOGY REPORT*  Clinical Data:  Kidney cancer.  Hypervascular pancreatic mass.  CT CHEST, ABDOMEN AND PELVIS WITH CONTRAST  Technique:  Multidetector CT imaging of the chest, abdomen and pelvis was performed following the standard protocol during bolus administration of intravenous contrast.  Contrast: 140mL OMNIPAQUE IOHEXOL 300 MG/ML  SOLN  Comparison:  CT of the chest abdomen and pelvis 12/03/2012.  CT CHEST  Findings:  Mediastinum: There is a small amount of low attenuation material along the posterior aspect of the proximal right mainstem bronchus, favored to represent retained secretions. Heart size is normal. There is no significant pericardial fluid, thickening or pericardial calcification. No pathologically enlarged mediastinal or hilar lymph nodes. Esophagus is unremarkable in appearance.  Lungs/Pleura: Prominent infrahilar soft tissue around the proximal aspects of the medial and lateral segment bronchi to the right middle lobe is again noted, measuring approximately 2.1 x 1.7 cm on today's examination (image 61 of series 6), appearing very similar to the prior examination.  This again has spiculated margins and is associated with some focal scarring.  Postoperative  changes of right lower lobe wedge resection are redemonstrated.  A chronic area of scarring in the medial aspect of the right upper lobe is similar to prior examinations, possibly related to prior radiation therapy.  No new suspicious appearing pulmonary nodules or masses are identified.  Scattered areas of mild pleural thickening are noted in the right hemithorax.  No significant pleural effusions.  Musculoskeletal: Old healed posterior right seventh rib fracture incidentally noted. There are no aggressive appearing lytic or blastic lesions noted in the visualized portions of the skeleton.  IMPRESSION:  1.  No significant change in pulmonary nodule between the medial and lateral segmental bronchi to the right middle lobe, as above. 2.  No new pulmonary nodules are noted. 3.  Postoperative and post treatment related changes redemonstrated, as above. 4.  Additional incidental findings, as above.  CT ABDOMEN AND PELVIS  Findings:  Abdomen/Pelvis: Again noted are multiple hypervascular enhancing pancreatic lesions.  The largest of these lesions are located in the head of the pancreas (13 x 12 mm on image 45 of series 2) and in the body of the pancreas (2.4 x 1.8 cm on image 36 of series 2). These lesions appear to cause some degree of ductal obstruction as evidenced by ductal dilatation (pancreatic duct measures up to 8 mm in diameter in the proximal body of the pancreas and 6 mm in diameter of the tail).  No definite peripancreatic lymphadenopathy or extension of these masses into adjacent soft tissues.  A subcentimeter low attenuation lesion in segment 5 of the liver is too small to characterize but is unchanged and favored to be benign.  No other new hepatic lesions are noted.  The appearance of the gallbladder, spleen and bilateral adrenal glands is unremarkable.  Status post left nephrectomy.  There is a focal contour abnormality of the lateral aspect of the lower pole of the right kidney where there is a focal  area measuring approximately 12 x 8 mm which demonstrates potential enhancement; however, this is too small to definitively characterize by CT.  No significant volume of ascites.  No pneumoperitoneum.  No pathologic distension of small bowel.  Normal appendix.  Prostate and urinary bladder are unremarkable in appearance.  Musculoskeletal: Ill-defined area of mixed lucency and sclerosis in the right sacral ala is compatible with known metastatic lesion (see MRI of the pelvis 03/09/2013 for further details).  No other aggressive appearing lytic or blastic lesions are otherwise noted in the visualized skeleton.  IMPRESSION:  1.  Multiple hypervascular pancreatic masses suspicious for metastatic disease appears slightly larger than the prior examination, with the largest lesion in the body of the pancreas measuring 2.4 x 1.8 cm on today's study. 2.  These pancreatic lesions are again associated with some mild pancreatic ductal dilatation which appears unchanged. 3.  Mixed lytic and  sclerotic lesion in the right sacral ala compatible with bony metastasis, better demonstrated on the MRI of the pelvis 03/09/2013. 4.  Normal appendix. 5.  Additional incidental findings, as above.   Original Report Authenticated By: Vinnie Langton, M.D.    Mr Jeri Cos Wo Contrast  03/22/2013  *RADIOLOGY REPORT*  Clinical Data: Renal cell cancer.  Osseous metastatic disease. Headaches.  MRI HEAD WITHOUT AND WITH CONTRAST  Technique:  Multiplanar, multiecho pulse sequences of the brain and surrounding structures were obtained according to standard protocol without and with intravenous contrast  Contrast: 28mL MULTIHANCE GADOBENATE DIMEGLUMINE 529 MG/ML IV SOLN  Comparison: None.  Findings: No acute stroke or hemorrhage.  No mass lesion or hydrocephalus.  Post infusion, no abnormal intracranial enhancement except for a left cerebellar venous angioma. Normal cerebral volume.  Slight periventricular white matter signal abnormality, nonspecific.   No skull base, calvarial, or upper cervical osseous disease.  Major intracranial vascular structures widely patent.  No midline abnormality.  Negative orbits, sinuses, and mastoids.  IMPRESSION: No evidence for intracranial metastatic disease from renal cell cancer.  No evidence for calvarial or skull base osseous disease.   Original Report Authenticated By: Rolla Flatten, M.D.    Mr Thoracic Spine W Wo Contrast  04/05/2013  *RADIOLOGY REPORT*  Clinical Data: History of renal cell carcinoma with metastatic disease to bone.  MRI THORACIC SPINE WITHOUT AND WITH CONTRAST  Technique:  Multiplanar and multiecho pulse sequences of the thoracic spine were obtained without and with intravenous contrast.  Contrast: 12mL MULTIHANCE GADOBENATE DIMEGLUMINE 529 MG/ML IV SOLN  Comparison: As, abdomen and pelvis 03/22/2013.  Findings: There is multilevel degenerative endplate signal change in the upper and mid thoracic spine.  Small focus of decreased T1 signal with minimal post contrast enhancement in the superior plate of T6 is likely degenerative.  No definite metastatic deposit is seen.  The thoracic cord demonstrates normal signal throughout.  No epidural tumor is seen.  The central spinal canal is widely patent at all levels.  Remote fracture of the posterior arcs of the right fourth and seventh ribs are noted.  IMPRESSION: Negative for metastatic disease of the thoracic spine. Small focus of edema and mild enhancement in the anterior, superior endplate of T6 is most consistent with degenerative disease.  Post-treatment change in the right upper lobe is noted as described on the comparison CT scan.   Original Report Authenticated By: Orlean Patten, M.D.    Mr Lumbar Spine W Wo Contrast  03/09/2013  *RADIOLOGY REPORT*  Clinical Data: Renal cell carcinoma.  Left back and leg pain.  MRI LUMBAR SPINE WITHOUT AND WITH CONTRAST  Technique:  Multiplanar and multiecho pulse sequences of the lumbar spine were obtained without  and with intravenous contrast.  Contrast: 31mL MULTIHANCE GADOBENATE DIMEGLUMINE 529 MG/ML IV SOLN  Comparison: 12/03/2012  Findings: Abnormal T2 hyperintense enhancing lesion in the right side of the sacrum measures 2.8 x 2.1 cm on axial images, and is highly suspicious for osseous metastatic disease.  Mild degenerative endplate findings noted along the right inferior endplate of L4. The lowest full intervertebral disk space is labeled L5-S1.  If procedural intervention is to be performed, careful correlation with this numbering strategy is recommended.  The conus medullaris appears unremarkable.  Conus level:  L1. Small L1 vertebral body hemangioma noted. No significant vertebral subluxation.  Left kidney absent.  Additional findings at individual levels are as follows:  L1-2:  Unremarkable.  L2-3:  No impingement.  Mild disc bulge.  L3-4:  Borderline bilateral foraminal stenosis and borderline central stenosis due to disc bulge.  L4-5:  Moderate central stenosis, mild left and borderline right subarticular lateral recess stenosis, and borderline bilateral foraminal stenosis secondary to disc bulge, central disc protrusion, and facet arthropathy.  L5-S1:  Unremarkable.  IMPRESSION:  1. In the context of the patient's clinical history, the 2.8 x 2.1 cm right sacral lesion is highly concerning for osseous metastatic disease.  Stress fracture is a significantly less likely differential diagnostic consideration. 2.  Lumbar spondylosis and degenerative disc disease, causing moderate impingement at L4-5.   Original Report Authenticated By: Van Clines, M.D.    Mr Pelvis W Wo Contrast  03/09/2013  *RADIOLOGY REPORT*  Clinical Data: Metastatic renal cell carcinoma.  Left low back pain.  MRI PELVIS WITHOUT AND WITH CONTRAST  Technique:  Multiplanar multisequence MR imaging of the pelvis was performed both before and after administration of intravenous contrast.  Contrast:  20 ml Multihance  Comparison: Multiple  exams, including 03/09/2013 and 12/03/2012  Findings: Eight T2 hyperintense, enhancing lesion in the right sacrum is observed with the overall region of abnormal signal measuring 4.4 x 3.2 x 3.5 cm.  This extends near the right S1 foramen but does not obviously invade the foramen or extend beyond the bony cortex.  No transverse edema in the sacrum, or other bony pelvic enhancing lesions noted.  No invasion across the sacroiliac joint on the right.  Pubic symphysis appears normal.  Proximal hamstring tendons unremarkable.  The hip adductor musculature appears normal.  No findings of avascular necrosis or hip effusion.  No regional bursitis.  No sciatic notch or obturator impingement noted.  No regional soft tissue metastatic disease.  IMPRESSION:  1.  Enhancing mass in the right sacrum most compatible with osseous metastatic disease.  No extraosseous extension.   Original Report Authenticated By: Van Clines, M.D.    Nm Bone Scan Whole Body  04/05/2013  *RADIOLOGY REPORT*  Clinical Data: History of renal cell carcinoma with a known sacral metastasis.  NUCLEAR MEDICINE WHOLE BODY BONE SCINTIGRAPHY  Technique:  Whole body anterior and posterior images were obtained approximately 3 hours after intravenous injection of radiopharmaceutical.  Radiopharmaceutical: 26.3MILLI CURIE TC-MDP TECHNETIUM TC 24M MEDRONATE IV KIT  Comparison: Previous examinations, including the thoracic spine MR dated 04/05/2013, chest, abdomen and pelvis CT dated 03/22/2013 and pelvis MR dated 03/09/2013.  Findings: Increased tracer uptake in the upper sacrum, corresponding to the probable metastasis seen on the previous CT and MR.  Increased tracer uptake in the posterior aspect of the fourth and seventh ribs, corresponding to healing fractures on the CT.  There is also increased tracer uptake at the right 8th and 9th costovertebral junctions corresponding to degenerative changes on the CT.  Increased tracer uptake in both knees,  compatible with degenerative changes.  Normal renal activity on the right.  No renal activity on the left, status post left nephrectomy.  Normal bladder activity.  IMPRESSION:  1.  Solitary probable metastasis in the upper sacrum. 2.  Healing right rib fractures and costovertebral junction degenerative changes. 3.  Bilateral knee degenerative changes.   Original Report Authenticated By: Claudie Revering, M.D.    Ct Abdomen Pelvis W Contrast  03/22/2013  *RADIOLOGY REPORT*  Clinical Data:  Kidney cancer.  Hypervascular pancreatic mass.  CT CHEST, ABDOMEN AND PELVIS WITH CONTRAST  Technique:  Multidetector CT imaging of the chest, abdomen and pelvis was performed following the standard protocol during bolus administration of intravenous contrast.  Contrast: 160mL OMNIPAQUE IOHEXOL 300 MG/ML  SOLN  Comparison:  CT of the chest abdomen and pelvis 12/03/2012.  CT CHEST  Findings:  Mediastinum: There is a small amount of low attenuation material along the posterior aspect of the proximal right mainstem bronchus, favored to represent retained secretions. Heart size is normal. There is no significant pericardial fluid, thickening or pericardial calcification. No pathologically enlarged mediastinal or hilar lymph nodes. Esophagus is unremarkable in appearance.  Lungs/Pleura: Prominent infrahilar soft tissue around the proximal aspects of the medial and lateral segment bronchi to the right middle lobe is again noted, measuring approximately 2.1 x 1.7 cm on today's examination (image 61 of series 6), appearing very similar to the prior examination.  This again has spiculated margins and is associated with some focal scarring.  Postoperative changes of right lower lobe wedge resection are redemonstrated.  A chronic area of scarring in the medial aspect of the right upper lobe is similar to prior examinations, possibly related to prior radiation therapy.  No new suspicious appearing pulmonary nodules or masses are identified.   Scattered areas of mild pleural thickening are noted in the right hemithorax.  No significant pleural effusions.  Musculoskeletal: Old healed posterior right seventh rib fracture incidentally noted. There are no aggressive appearing lytic or blastic lesions noted in the visualized portions of the skeleton.  IMPRESSION:  1.  No significant change in pulmonary nodule between the medial and lateral segmental bronchi to the right middle lobe, as above. 2.  No new pulmonary nodules are noted. 3.  Postoperative and post treatment related changes redemonstrated, as above. 4.  Additional incidental findings, as above.  CT ABDOMEN AND PELVIS  Findings:  Abdomen/Pelvis: Again noted are multiple hypervascular enhancing pancreatic lesions.  The largest of these lesions are located in the head of the pancreas (13 x 12 mm on image 45 of series 2) and in the body of the pancreas (2.4 x 1.8 cm on image 36 of series 2). These lesions appear to cause some degree of ductal obstruction as evidenced by ductal dilatation (pancreatic duct measures up to 8 mm in diameter in the proximal body of the pancreas and 6 mm in diameter of the tail).  No definite peripancreatic lymphadenopathy or extension of these masses into adjacent soft tissues.  A subcentimeter low attenuation lesion in segment 5 of the liver is too small to characterize but is unchanged and favored to be benign.  No other new hepatic lesions are noted.  The appearance of the gallbladder, spleen and bilateral adrenal glands is unremarkable.  Status post left nephrectomy.  There is a focal contour abnormality of the lateral aspect of the lower pole of the right kidney where there is a focal area measuring approximately 12 x 8 mm which demonstrates potential enhancement; however, this is too small to definitively characterize by CT.  No significant volume of ascites.  No pneumoperitoneum.  No pathologic distension of small bowel.  Normal appendix.  Prostate and urinary bladder  are unremarkable in appearance.  Musculoskeletal: Ill-defined area of mixed lucency and sclerosis in the right sacral ala is compatible with known metastatic lesion (see MRI of the pelvis 03/09/2013 for further details).  No other aggressive appearing lytic or blastic lesions are otherwise noted in the visualized skeleton.  IMPRESSION:  1.  Multiple hypervascular pancreatic masses suspicious for metastatic disease appears slightly larger than the prior examination, with the largest lesion in the body of the pancreas measuring 2.4 x 1.8 cm on today's  study. 2.  These pancreatic lesions are again associated with some mild pancreatic ductal dilatation which appears unchanged. 3.  Mixed lytic and sclerotic lesion in the right sacral ala compatible with bony metastasis, better demonstrated on the MRI of the pelvis 03/09/2013. 4.  Normal appendix. 5.  Additional incidental findings, as above.   Original Report Authenticated By: Vinnie Langton, M.D.     Impression:  The patient has progression of a right sacral metastasis which may be amenable to radiotherapy for palliation of pai.  We could proceed with radiotherapy in an effort to control pain.   Plan:  Today, I talked to the patient and family about the findings and work-up thus far.  We discussed the natural history of disease and general treatment, highlighting the role or radiotherapy in the management.  We discussed the available radiation techniques, and focused on the details of logistics and delivery.  We reviewed the anticipated acute and late sequelae associated with radiation in this setting.  The patient was encouraged to ask questions that I answered to the best of my ability.  I filled out a patient counseling form during our discussion including treatment diagrams.  We retained a copy for our records.  The patient would like to proceed with radiation and will be scheduled for CT simulation later today.  I spent 60 minutes minutes face to face with  the patient and more than 50% of that time was spent in counseling and/or coordination of care.   _____________________________________  Sheral Apley. Tammi Klippel, M.D.

## 2013-04-07 NOTE — Progress Notes (Signed)
Provided Marthann Schiller with Health care power of attorney, declaration of a desire for a natural death, and HIPAA release to scan into this patient's chart.

## 2013-04-07 NOTE — Progress Notes (Signed)
See progress note under physician encounter. 

## 2013-04-08 NOTE — Addendum Note (Signed)
Encounter addended by: Deirdre Evener, RN on: 04/08/2013  7:26 PM<BR>     Documentation filed: Charges VN

## 2013-04-11 ENCOUNTER — Ambulatory Visit
Admission: RE | Admit: 2013-04-11 | Payer: BC Managed Care – PPO | Source: Ambulatory Visit | Admitting: Radiation Oncology

## 2013-04-12 ENCOUNTER — Ambulatory Visit
Admission: RE | Admit: 2013-04-12 | Discharge: 2013-04-12 | Disposition: A | Discharge status: Home / Self Care | Payer: BC Managed Care – PPO | Source: Ambulatory Visit | Attending: Radiation Oncology | Admitting: Radiation Oncology

## 2013-04-12 DIAGNOSIS — C7951 Secondary malignant neoplasm of bone: Secondary | ICD-10-CM

## 2013-04-12 NOTE — Progress Notes (Signed)
  Radiation Oncology         [phone] ________________________________  Name: Keith [name] MRN: [id number]  Date: 04/12/2013  DOB: [birth year]  Stereotactic Body Radiotherapy Treatment Procedure Note  NARRATIVE:  Keith [name] was brought to the stereotactic radiation treatment machine and placed supine on the CT couch. The patient was set up for stereotactic body radiotherapy on the body fix pillow.  3D TREATMENT PLANNING AND DOSIMETRY:  The patient's radiation plan was reviewed and approved prior to starting treatment.  It showed 3-dimensional radiation distributions overlaid onto the planning CT.  The [facility] for the target structures as well as the organs at risk were reviewed. The documentation of this is filed in the radiation oncology EMR.  SIMULATION VERIFICATION:  The patient underwent CT imaging on the treatment unit.  These were carefully aligned to document that the ablative radiation dose would cover the target volume and maximally spare the nearby organs at risk according to the planned distribution.  SPECIAL TREATMENT PROCEDURE: Keith [name] received high dose ablative stereotactic body radiotherapy to the planned target volume without unforeseen complications. Treatment was delivered uneventfully. The high doses associated with stereotactic body radiotherapy and the significant potential risks require careful treatment set up and patient monitoring constituting a special treatment procedure   STEREOTACTIC TREATMENT MANAGEMENT:  Following delivery, the patient was evaluated clinically. The patient tolerated treatment without significant acute effects, and was discharged to home in stable condition.    PLAN: Continue treatment as planned.  _________________________   Thea Silversmith, MD

## 2013-04-13 ENCOUNTER — Telehealth: Payer: Self-pay | Admitting: *Deleted

## 2013-04-13 ENCOUNTER — Ambulatory Visit: Payer: BC Managed Care – PPO | Admitting: Radiation Oncology

## 2013-04-13 NOTE — Telephone Encounter (Signed)
Called, no answer.

## 2013-04-14 ENCOUNTER — Ambulatory Visit
Admission: RE | Admit: 2013-04-14 | Discharge: 2013-04-14 | Disposition: A | Discharge status: Home / Self Care | Payer: BC Managed Care – PPO | Source: Ambulatory Visit | Attending: Radiation Oncology | Admitting: Radiation Oncology

## 2013-04-14 DIAGNOSIS — C7951 Secondary malignant neoplasm of bone: Secondary | ICD-10-CM

## 2013-04-14 NOTE — Progress Notes (Signed)
  Radiation Oncology         [phone] ________________________________  Name: Keith [name] MRN: [id number]  Date: 04/14/2013  DOB: Nov 23, 1966  Stereotactic Body Radiotherapy Treatment Procedure Note  NARRATIVE:  Keith [name] was brought to the stereotactic radiation treatment machine and placed supine on the CT couch. The patient was set up for stereotactic body radiotherapy on the body fix pillow.  3D TREATMENT PLANNING AND DOSIMETRY:  The patient's radiation plan was reviewed and approved prior to starting treatment.  It showed 3-dimensional radiation distributions overlaid onto the planning CT.  The [facility] for the target structures as well as the organs at risk were reviewed. The documentation of this is filed in the radiation oncology EMR.  SIMULATION VERIFICATION:  The patient underwent CT imaging on the treatment unit.  These were carefully aligned to document that the ablative radiation dose would cover the target volume and maximally spare the nearby organs at risk according to the planned distribution.  SPECIAL TREATMENT PROCEDURE: Keith [name] received high dose ablative stereotactic body radiotherapy to the planned target volume without unforeseen complications. Treatment was delivered uneventfully. The high doses associated with stereotactic body radiotherapy and the significant potential risks require careful treatment set up and patient monitoring constituting a special treatment procedure   STEREOTACTIC TREATMENT MANAGEMENT:  Following delivery, the patient was evaluated clinically. The patient tolerated treatment without significant acute effects, and was discharged to home in stable condition.    PLAN: Continue treatment as planned.  _________________________   Thea Silversmith, MD

## 2013-04-15 ENCOUNTER — Ambulatory Visit
Admission: RE | Admit: 2013-04-15 | Discharge: 2013-04-15 | Disposition: A | Discharge status: Home / Self Care | Payer: BC Managed Care – PPO | Source: Ambulatory Visit | Attending: Radiation Oncology | Admitting: Radiation Oncology

## 2013-04-15 ENCOUNTER — Encounter: Payer: Self-pay | Admitting: Radiation Oncology

## 2013-04-18 ENCOUNTER — Ambulatory Visit
Admission: RE | Admit: 2013-04-18 | Discharge: 2013-04-18 | Disposition: A | Discharge status: Home / Self Care | Payer: BC Managed Care – PPO | Source: Ambulatory Visit | Attending: Radiation Oncology | Admitting: Radiation Oncology

## 2013-04-18 ENCOUNTER — Ambulatory Visit: Payer: BC Managed Care – PPO | Admitting: Radiation Oncology

## 2013-04-18 DIAGNOSIS — C7951 Secondary malignant neoplasm of bone: Secondary | ICD-10-CM

## 2013-04-18 NOTE — Progress Notes (Signed)
  Radiation Oncology         (336) [phone] ________________________________  Name: Keith [name] MRN: [id number]  Date: 04/18/2013  DOB: 14-Jun-1966  Stereotactic Body Radiotherapy Treatment Procedure Note  NARRATIVE:  Keith [name] was brought to the stereotactic radiation treatment machine and placed supine on the CT couch. The patient was set up for stereotactic body radiotherapy on the body fix pillow.  3D TREATMENT PLANNING AND DOSIMETRY:  The patient's radiation plan was reviewed and approved prior to starting treatment.  It showed 3-dimensional radiation distributions overlaid onto the planning CT.  The [facility] for the target structures as well as the organs at risk were reviewed. The documentation of this is filed in the radiation oncology EMR.  SIMULATION VERIFICATION:  The patient underwent CT imaging on the treatment unit.  These were carefully aligned to document that the ablative radiation dose would cover the target volume and maximally spare the nearby organs at risk according to the planned distribution.  SPECIAL TREATMENT PROCEDURE: Keith [name] received high dose ablative stereotactic body radiotherapy to the planned target volume without unforeseen complications. Treatment was delivered uneventfully. The high doses associated with stereotactic body radiotherapy and the significant potential risks require careful treatment set up and patient monitoring constituting a special treatment procedure   STEREOTACTIC TREATMENT MANAGEMENT:  Following delivery, the patient was evaluated clinically. The patient tolerated treatment without significant acute effects, and was discharged to home in stable condition.    PLAN: Continue treatment as planned.  ________________________________  Sheral Apley. Tammi Klippel, M.D.

## 2013-04-19 ENCOUNTER — Ambulatory Visit: Payer: BC Managed Care – PPO | Admitting: Radiation Oncology

## 2013-04-19 NOTE — Progress Notes (Signed)
  Radiation Oncology         (336) [phone] ________________________________  Name: Hardik Farinha MRN: [id number]  Date: 04/20/2013  DOB: 1966-11-22  Stereotactic Body Radiotherapy Treatment Procedure Note  NARRATIVE:  Ninos Wildeman was brought to the stereotactic radiation treatment machine and placed supine on the CT couch. The patient was set up for stereotactic body radiotherapy on the body fix pillow.  3D TREATMENT PLANNING AND DOSIMETRY:  The patient's radiation plan was reviewed and approved prior to starting treatment.  It showed 3-dimensional radiation distributions overlaid onto the planning CT.  The [facility] for the target structures as well as the organs at risk were reviewed. The documentation of this is filed in the radiation oncology EMR.  SIMULATION VERIFICATION:  The patient underwent CT imaging on the treatment unit.  These were carefully aligned to document that the ablative radiation dose would cover the target volume and maximally spare the nearby organs at risk according to the planned distribution.  SPECIAL TREATMENT PROCEDURE: Rayna Sexton received high dose ablative stereotactic body radiotherapy to the planned target volume without unforeseen complications. Treatment was delivered uneventfully. The high doses associated with stereotactic body radiotherapy and the significant potential risks require careful treatment set up and patient monitoring constituting a special treatment procedure   STEREOTACTIC TREATMENT MANAGEMENT:  Following delivery, the patient was evaluated clinically. The patient tolerated treatment without significant acute effects, and was discharged to home in stable condition.    PLAN: Continue treatment as planned.  ________________________________  Sheral Apley. Tammi Klippel, M.D.

## 2013-04-20 ENCOUNTER — Ambulatory Visit
Admission: RE | Admit: 2013-04-20 | Discharge: 2013-04-20 | Disposition: A | Discharge status: Home / Self Care | Payer: BC Managed Care – PPO | Source: Ambulatory Visit | Attending: Radiation Oncology | Admitting: Radiation Oncology

## 2013-04-20 ENCOUNTER — Ambulatory Visit
Admission: RE | Admit: 2013-04-20 | Discharge: 2013-04-20 | Disposition: A | Discharge status: Home / Self Care | Payer: BC Managed Care – PPO | Source: Ambulatory Visit | Admitting: Radiation Oncology

## 2013-04-20 ENCOUNTER — Encounter: Payer: Self-pay | Admitting: Radiation Oncology

## 2013-04-20 ENCOUNTER — Ambulatory Visit: Payer: BC Managed Care – PPO | Admitting: Radiation Oncology

## 2013-04-20 VITALS — BP 152/93 | HR 64 | Temp 97.5°F | Ht 75.0 in | Wt 220.2 lb

## 2013-04-20 DIAGNOSIS — C7951 Secondary malignant neoplasm of bone: Secondary | ICD-10-CM

## 2013-04-20 NOTE — Progress Notes (Signed)
Keith [name] here for his weekly under treat/final treatment visit.  He does have pain that he rates at a 4/10 in his left leg.  He does have fatigue.  He has noticed an increase in the frequency of urination.  He denies diarrhea, urinary urgency, notcuria and hematuria.

## 2013-04-21 ENCOUNTER — Other Ambulatory Visit: Payer: Self-pay | Admitting: *Deleted

## 2013-04-21 ENCOUNTER — Ambulatory Visit: Payer: BC Managed Care – PPO | Admitting: Radiation Oncology

## 2013-04-21 ENCOUNTER — Telehealth: Payer: Self-pay | Admitting: Oncology

## 2013-04-21 ENCOUNTER — Telehealth: Payer: Self-pay | Admitting: *Deleted

## 2013-04-21 DIAGNOSIS — C649 Malignant neoplasm of unspecified kidney, except renal pelvis: Secondary | ICD-10-CM

## 2013-04-21 MED ORDER — PAZOPANIB HCL 200 MG PO TABS: 800.0000 mg | ORAL_TABLET | Freq: Every day | ORAL | Status: DC

## 2013-04-21 NOTE — Telephone Encounter (Signed)
THIS REFILL REQUEST FOR VOTRIENT WAS PLACED IN DR.SHADAD'S ACTIVE WORK FOLDER. 

## 2013-04-21 NOTE — Telephone Encounter (Signed)
Fax medical records to [place] for patient.

## 2013-04-21 NOTE — Progress Notes (Signed)
  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 Each day, he underwent cone beam CT imaging to position the targeted metastasis properly within the target zone. Then, treatment was delivered using volumetric ARC therapy delivering 10 megavolt photons.  Narrative: The patient tolerated radiation treatment relatively well.   He had no acute complications during the course radiation.  Plan: The patient has completed radiation treatment. The patient will return to radiation oncology clinic for routine followup in one month. I advised them to call or return sooner if they have any questions or concerns related to their recovery or treatment. ________________________________  Sheral Apley. Tammi Klippel, M.D.

## 2013-04-21 NOTE — Telephone Encounter (Signed)
CALLED PATIENT TO INFORM OF FU VISIT ON 05-19-13, LVM FOR A RETURN CALL, MAILED APPT. CARD.

## 2013-04-21 NOTE — Progress Notes (Signed)
  Radiation Oncology         (336) [phone] ________________________________  Name: Keith [name] MRN: [id number]  Date: 04/15/2013  DOB: 1966/01/05  Stereotactic Body Radiotherapy Treatment Procedure Note  NARRATIVE:  Keith [name] was brought to the stereotactic radiation treatment machine and placed supine on the CT couch. The patient was set up for stereotactic body radiotherapy on the body fix pillow.  3D TREATMENT PLANNING AND DOSIMETRY:  The patient's radiation plan was reviewed and approved prior to starting treatment.  It showed 3-dimensional radiation distributions overlaid onto the planning CT.  The [facility] for the target structures as well as the organs at risk were reviewed. The documentation of this is filed in the radiation oncology EMR.  SIMULATION VERIFICATION:  The patient underwent CT imaging on the treatment unit.  These were carefully aligned to document that the ablative radiation dose would cover the target volume and maximally spare the nearby organs at risk according to the planned distribution.  SPECIAL TREATMENT PROCEDURE: Keith [name] received high dose ablative stereotactic body radiotherapy to the planned target volume without unforeseen complications. Treatment was delivered uneventfully. The high doses associated with stereotactic body radiotherapy and the significant potential risks require careful treatment set up and patient monitoring constituting a special treatment procedure   STEREOTACTIC TREATMENT MANAGEMENT:  Following delivery, the patient was evaluated clinically. The patient tolerated treatment without significant acute effects, and was discharged to home in stable condition.    PLAN: Continue treatment as planned.  ________________________________

## 2013-04-22 ENCOUNTER — Ambulatory Visit: Payer: BC Managed Care – PPO | Admitting: Oncology

## 2013-04-22 ENCOUNTER — Other Ambulatory Visit: Payer: BC Managed Care – PPO | Admitting: Lab

## 2013-04-24 ENCOUNTER — Emergency Department (HOSPITAL_COMMUNITY): Payer: BC Managed Care – PPO

## 2013-04-24 ENCOUNTER — Encounter (HOSPITAL_COMMUNITY): Payer: Self-pay | Admitting: Emergency Medicine

## 2013-04-24 ENCOUNTER — Inpatient Hospital Stay (HOSPITAL_COMMUNITY)
Admission: EM | Admit: 2013-04-24 | Discharge: 2013-04-26 | DRG: 024 | Disposition: A | Discharge status: Home / Self Care | Payer: BC Managed Care – PPO | Attending: Internal Medicine | Admitting: Internal Medicine

## 2013-04-24 ENCOUNTER — Observation Stay (HOSPITAL_COMMUNITY): Payer: BC Managed Care – PPO

## 2013-04-24 DIAGNOSIS — C7951 Secondary malignant neoplasm of bone: Secondary | ICD-10-CM | POA: Diagnosis present

## 2013-04-24 DIAGNOSIS — D696 Thrombocytopenia, unspecified: Secondary | ICD-10-CM | POA: Diagnosis present

## 2013-04-24 DIAGNOSIS — K219 Gastro-esophageal reflux disease without esophagitis: Secondary | ICD-10-CM | POA: Diagnosis present

## 2013-04-24 DIAGNOSIS — C7800 Secondary malignant neoplasm of unspecified lung: Secondary | ICD-10-CM | POA: Diagnosis present

## 2013-04-24 DIAGNOSIS — C7889 Secondary malignant neoplasm of other digestive organs: Secondary | ICD-10-CM | POA: Diagnosis present

## 2013-04-24 DIAGNOSIS — Z79899 Other long term (current) drug therapy: Secondary | ICD-10-CM

## 2013-04-24 DIAGNOSIS — C649 Malignant neoplasm of unspecified kidney, except renal pelvis: Secondary | ICD-10-CM | POA: Diagnosis present

## 2013-04-24 DIAGNOSIS — I498 Other specified cardiac arrhythmias: Secondary | ICD-10-CM | POA: Diagnosis present

## 2013-04-24 DIAGNOSIS — F17200 Nicotine dependence, unspecified, uncomplicated: Secondary | ICD-10-CM | POA: Diagnosis present

## 2013-04-24 DIAGNOSIS — F1910 Other psychoactive substance abuse, uncomplicated: Secondary | ICD-10-CM | POA: Diagnosis present

## 2013-04-24 DIAGNOSIS — R4182 Altered mental status, unspecified: Secondary | ICD-10-CM

## 2013-04-24 DIAGNOSIS — R569 Unspecified convulsions: Secondary | ICD-10-CM

## 2013-04-24 DIAGNOSIS — R112 Nausea with vomiting, unspecified: Secondary | ICD-10-CM

## 2013-04-24 DIAGNOSIS — Z923 Personal history of irradiation: Secondary | ICD-10-CM

## 2013-04-24 DIAGNOSIS — I4949 Other premature depolarization: Secondary | ICD-10-CM | POA: Diagnosis present

## 2013-04-24 LAB — CBC WITH DIFFERENTIAL/PLATELET
Basophils Absolute: 0 10*3/uL (ref 0.0–0.1)
Basophils Relative: 0 % (ref 0–1)
Eosinophils Absolute: 0 10*3/uL (ref 0.0–0.7)
Eosinophils Relative: 1 % (ref 0–5)
HCT: 41.4 % (ref 39.0–52.0)
Hemoglobin: 14.3 g/dL (ref 13.0–17.0)
Lymphocytes Relative: 11 % — ABNORMAL LOW (ref 12–46)
Lymphs Abs: 0.8 10*3/uL (ref 0.7–4.0)
MCH: 37 pg — ABNORMAL HIGH (ref 26.0–34.0)
MCHC: 34.5 g/dL (ref 30.0–36.0)
MCV: 107 fL — ABNORMAL HIGH (ref 78.0–100.0)
Monocytes Absolute: 0.5 10*3/uL (ref 0.1–1.0)
Monocytes Relative: 6 % (ref 3–12)
Neutro Abs: 6.3 10*3/uL (ref 1.7–7.7)
Neutrophils Relative %: 83 % — ABNORMAL HIGH (ref 43–77)
Platelets: 139 10*3/uL — ABNORMAL LOW (ref 150–400)
RBC: 3.87 MIL/uL — ABNORMAL LOW (ref 4.22–5.81)
RDW: 13.9 % (ref 11.5–15.5)
WBC: 7.6 10*3/uL (ref 4.0–10.5)

## 2013-04-24 LAB — COMPREHENSIVE METABOLIC PANEL
ALT: 35 U/L (ref 0–53)
AST: 24 U/L (ref 0–37)
Albumin: 3.4 g/dL — ABNORMAL LOW (ref 3.5–5.2)
Alkaline Phosphatase: 96 U/L (ref 39–117)
BUN: 11 mg/dL (ref 6–23)
CO2: 31 mEq/L (ref 19–32)
Calcium: 8.6 mg/dL (ref 8.4–10.5)
Chloride: 104 mEq/L (ref 96–112)
Creatinine, Ser: 1.09 mg/dL (ref 0.50–1.35)
GFR calc Af Amer: >90 mL/min (ref >90)
GFR calc non Af Amer: 80 mL/min — ABNORMAL LOW (ref >90)
Glucose, Bld: 124 mg/dL — ABNORMAL HIGH (ref 70–99)
Potassium: 4.1 mEq/L (ref 3.5–5.1)
Sodium: 139 mEq/L (ref 135–145)
Total Bilirubin: 0.3 mg/dL (ref 0.3–1.2)
Total Protein: 6.9 g/dL (ref 6.0–8.3)

## 2013-04-24 LAB — URINALYSIS, ROUTINE W REFLEX MICROSCOPIC
Bilirubin Urine: NEGATIVE
Glucose, UA: NEGATIVE mg/dL
Hgb urine dipstick: NEGATIVE
Ketones, ur: NEGATIVE mg/dL
Leukocytes, UA: NEGATIVE
Nitrite: NEGATIVE
Protein, ur: 30 mg/dL — AB
Specific Gravity, Urine: 1.029 (ref 1.005–1.030)
Urobilinogen, UA: 0.2 mg/dL (ref 0.0–1.0)
pH: 5 (ref 5.0–8.0)

## 2013-04-24 LAB — ACETAMINOPHEN LEVEL: Acetaminophen (Tylenol), Serum: <15 ug/mL (ref 10–30)

## 2013-04-24 LAB — RAPID URINE DRUG SCREEN, HOSP PERFORMED
Amphetamines: NOT DETECTED
Barbiturates: NOT DETECTED
Benzodiazepines: POSITIVE — AB
Cocaine: POSITIVE — AB
Opiates: NOT DETECTED
Tetrahydrocannabinol: POSITIVE — AB

## 2013-04-24 LAB — URINE MICROSCOPIC-ADD ON

## 2013-04-24 LAB — ETHANOL: Alcohol, Ethyl (B): <11 mg/dL (ref 0–11)

## 2013-04-24 LAB — LIPASE, BLOOD: Lipase: 36 U/L (ref 11–59)

## 2013-04-24 LAB — TROPONIN I: Troponin I: <0.3 ng/mL (ref <0.30)

## 2013-04-24 LAB — SALICYLATE LEVEL: Salicylate Lvl: <2 mg/dL — ABNORMAL LOW (ref 2.8–20.0)

## 2013-04-24 LAB — APTT: aPTT: 27 seconds (ref 24–37)

## 2013-04-24 MED ORDER — ONDANSETRON HCL 4 MG/2ML IJ SOLN: 4.0000 mg | Freq: Three times a day (TID) | INTRAMUSCULAR | Status: DC | PRN

## 2013-04-24 MED ORDER — SODIUM CHLORIDE 0.9 % IJ SOLN: 3.0000 mL | Freq: Two times a day (BID) | INTRAMUSCULAR | Status: DC

## 2013-04-24 MED ORDER — SODIUM CHLORIDE 0.9 % IV BOLUS (SEPSIS)
1000.0000 mL | Freq: Once | INTRAVENOUS | Status: AC
  Administered 2013-04-24: 1000 mL via INTRAVENOUS

## 2013-04-24 MED ORDER — ONDANSETRON HCL 4 MG/2ML IJ SOLN
4.0000 mg | Freq: Four times a day (QID) | INTRAMUSCULAR | Status: DC | PRN
  Administered 2013-04-24 – 2013-04-25 (×2): 4 mg via INTRAVENOUS
  Filled 2013-04-24 (×2): qty 2

## 2013-04-24 MED ORDER — ENOXAPARIN SODIUM 40 MG/0.4ML ~~LOC~~ SOLN
40.0000 mg | SUBCUTANEOUS | Status: DC
  Administered 2013-04-24 – 2013-04-25 (×2): 40 mg via SUBCUTANEOUS
  Filled 2013-04-24 (×3): qty 0.4

## 2013-04-24 MED ORDER — SODIUM CHLORIDE 0.9 % IV SOLN
INTRAVENOUS | Status: DC
Start: 2013-04-24 — End: 2013-04-26
  Administered 2013-04-24 – 2013-04-25 (×4): via INTRAVENOUS

## 2013-04-24 MED ORDER — OXYCODONE HCL 5 MG PO TABS
5.0000 mg | ORAL_TABLET | ORAL | Status: DC | PRN
  Administered 2013-04-24 – 2013-04-26 (×3): 5 mg via ORAL
  Filled 2013-04-24 (×3): qty 1

## 2013-04-24 MED ORDER — DULOXETINE HCL 60 MG PO CPEP
60.0000 mg | ORAL_CAPSULE | Freq: Every morning | ORAL | Status: DC
Start: 2013-04-24 — End: 2013-04-26
  Administered 2013-04-24 – 2013-04-26 (×3): 60 mg via ORAL
  Filled 2013-04-24 (×3): qty 1

## 2013-04-24 MED ORDER — ALPRAZOLAM 1 MG PO TABS: 1.0000 mg | ORAL_TABLET | Freq: Three times a day (TID) | ORAL | Status: DC | PRN

## 2013-04-24 MED ORDER — SODIUM CHLORIDE 0.9 % IV SOLN
INTRAVENOUS | Status: DC
  Administered 2013-04-24: 13:00:00 via INTRAVENOUS

## 2013-04-24 MED ORDER — ACETAMINOPHEN 325 MG PO TABS
650.0000 mg | ORAL_TABLET | Freq: Four times a day (QID) | ORAL | Status: DC | PRN
  Administered 2013-04-24: 650 mg via ORAL

## 2013-04-24 MED ORDER — ONDANSETRON HCL 4 MG PO TABS: 4.0000 mg | ORAL_TABLET | Freq: Four times a day (QID) | ORAL | Status: DC | PRN

## 2013-04-24 MED ORDER — ACETAMINOPHEN 650 MG RE SUPP: 650.0000 mg | Freq: Four times a day (QID) | RECTAL | Status: DC | PRN

## 2013-04-24 MED ORDER — GADOBENATE DIMEGLUMINE 529 MG/ML IV SOLN
20.0000 mL | Freq: Once | INTRAVENOUS | Status: AC | PRN
  Administered 2013-04-24: 20 mL via INTRAVENOUS

## 2013-04-24 MED ORDER — LORAZEPAM 2 MG/ML IJ SOLN: 1.0000 mg | INTRAMUSCULAR | Status: DC | PRN
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
## 2013-04-24 NOTE — ED Notes (Signed)
Per EMS-pt is a cancer pt with c/o of nausea, vomiting. States that he had radiation treatment yesterday, usually doesn't have problems. Emesis x3

## 2013-04-24 NOTE — Progress Notes (Addendum)
MRI ordered for this afternoon. No MRI tech here today. Dr Dillard Essex notified and gave approval for MRI to be done Monday am. Bardia Wangerin, Bing Neighbors, RN
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
## 2013-04-25 ENCOUNTER — Ambulatory Visit: Payer: BC Managed Care – PPO | Admitting: Radiation Oncology

## 2013-04-25 ENCOUNTER — Inpatient Hospital Stay (HOSPITAL_COMMUNITY)
Admit: 2013-04-25 | Discharge: 2013-04-25 | Disposition: A | Discharge status: Home / Self Care | Payer: BC Managed Care – PPO | Attending: Internal Medicine | Admitting: Internal Medicine

## 2013-04-25 ENCOUNTER — Ambulatory Visit: Payer: BC Managed Care – PPO

## 2013-04-25 DIAGNOSIS — C649 Malignant neoplasm of unspecified kidney, except renal pelvis: Secondary | ICD-10-CM

## 2013-04-25 DIAGNOSIS — C7800 Secondary malignant neoplasm of unspecified lung: Secondary | ICD-10-CM

## 2013-04-25 DIAGNOSIS — F1910 Other psychoactive substance abuse, uncomplicated: Secondary | ICD-10-CM | POA: Diagnosis present

## 2013-04-25 DIAGNOSIS — R112 Nausea with vomiting, unspecified: Secondary | ICD-10-CM

## 2013-04-25 DIAGNOSIS — D696 Thrombocytopenia, unspecified: Secondary | ICD-10-CM

## 2013-04-25 LAB — BASIC METABOLIC PANEL
BUN: 7 mg/dL (ref 6–23)
CO2: 29 mEq/L (ref 19–32)
Calcium: 7.9 mg/dL — ABNORMAL LOW (ref 8.4–10.5)
Chloride: 104 mEq/L (ref 96–112)
Creatinine, Ser: 0.92 mg/dL (ref 0.50–1.35)
GFR calc Af Amer: >90 mL/min (ref >90)
GFR calc non Af Amer: >90 mL/min (ref >90)
Glucose, Bld: 128 mg/dL — ABNORMAL HIGH (ref 70–99)
Potassium: 4 mEq/L (ref 3.5–5.1)
Sodium: 139 mEq/L (ref 135–145)

## 2013-04-25 LAB — CBC
HCT: 37 % — ABNORMAL LOW (ref 39.0–52.0)
Hemoglobin: 12.8 g/dL — ABNORMAL LOW (ref 13.0–17.0)
MCH: 36.9 pg — ABNORMAL HIGH (ref 26.0–34.0)
MCHC: 34.6 g/dL (ref 30.0–36.0)
MCV: 106.6 fL — ABNORMAL HIGH (ref 78.0–100.0)
Platelets: 124 10*3/uL — ABNORMAL LOW (ref 150–400)
RBC: 3.47 MIL/uL — ABNORMAL LOW (ref 4.22–5.81)
RDW: 13.8 % (ref 11.5–15.5)
WBC: 5.4 10*3/uL (ref 4.0–10.5)

## 2013-04-25 MED ORDER — ONDANSETRON HCL 4 MG/2ML IJ SOLN
4.0000 mg | INTRAMUSCULAR | Status: AC
  Administered 2013-04-25: 4 mg via INTRAVENOUS
  Filled 2013-04-25: qty 2

## 2013-04-25 NOTE — Progress Notes (Signed)
Offsite portable EEG completed at [facility].

## 2013-04-25 NOTE — Progress Notes (Addendum)
[facility] PROGRESS NOTE  Keith [name] [name] DOB: [birth year] DOA: 04/24/2013 PCP: [name], MD  Brief narrative 47 year old male patient with history of stage IV renal cell carcinoma, status post laparoscopic radical left nephrectomy, status post thoracotomy for left lower lobe lung nodule, treated with stereotactic radiotherapy, recent progression of cancer and started on Votrient, polysubstance abuse, was admitted on 04/24/2013 with complaints of nausea, vomiting (not his usual symptoms post chemotherapy) followed by an episode of generalized seizure like activity which lasted for approximately 2 minutes followed by postictal headache and drowsiness. Hospitalist admission requested. Patient denies prior history of seizures.  Assessment/Plan: 1. Seizure-like activity x1 on 04/24/13: MRI brain negative for metastasis or acute findings. EEG to be done. Neurology consultation and followup appreciated. AED not started. Per oncology, not likely to be due to chemotherapy or cancer.? Related to polysubstance abuse. Cessation counseled. Postictal headache. No driving and has to be seizure-free for 6 months at least. 2. Nausea and vomiting: Unclear etiology-? Chemotherapy/acute GE. Improved. Advance diet as tolerated. 3. Polysubstance abuse: UDS positive for cocaine and THC. Cessation counseled. 4. Thrombocytopenia: Chronic. Stable. 5. Stage IV renal cell carcinoma: Oncology input appreciated. Outpatient followup  Code Status: Full Family Communication: Discussed with patient spouse at bedside. Disposition Plan: Home when medically ready.   Consultants:  Neurology  Oncology  Procedures:  None  Antibiotics:  None   HPI/Subjective: Mild headache but better than yesterday. No further nausea or vomiting. No further seizure-like activities.  Objective: Filed Vitals:   04/24/13 1215 04/24/13 1230 04/24/13 1400 04/24/13 2215  BP:  108/88 136/92 119/78  Pulse: 62 56  100 58  Temp:   98.1 F (36.7 C) 98.4 F (36.9 C)  TempSrc:   Oral Oral  Resp: 20 14 18 20   Height:   6\' 3"  (1.905 m)   Weight:   101.4 kg (223 lb 8.7 oz)   SpO2: 99% 98% 100% 97%    Intake/Output Summary (Last 24 hours) at 04/25/13 0721 Last data filed at 04/24/13 2234  Gross per 24 hour  Intake 1818.33 ml  Output   1450 ml  Net 368.33 ml   Filed Weights   04/24/13 1400  Weight: 101.4 kg (223 lb 8.7 oz)    Exam:   General exam: Comfortable.  Respiratory system: Clear. No increased work of breathing.  Cardiovascular system: S1 & S2 heard, RRR. No JVD, murmurs, gallops, clicks or pedal edema. Telemetry: Sinus rhythm with occasional PVCs.  Gastrointestinal system: Abdomen is nondistended, soft and nontender. Normal bowel sounds heard.  Central nervous system: Alert and oriented. No focal neurological deficits.  Extremities: Symmetric 5 x 5 power.   Data Reviewed: Basic Metabolic Panel:  Recent Labs Lab 04/24/13 1043 04/25/13 0518  NA 139 139  K 4.1 4.0  CL 104 104  CO2 31 29  GLUCOSE 124* 128*  BUN 11 7  CREATININE 1.09 0.92  CALCIUM 8.6 7.9*   Liver Function Tests:  Recent Labs Lab 04/24/13 1043  AST 24  ALT 35  ALKPHOS 96  BILITOT 0.3  PROT 6.9  ALBUMIN 3.4*    Recent Labs Lab 04/24/13 1043  LIPASE 36   No results found for this basename: AMMONIA,  in the last 168 hours CBC:  Recent Labs Lab 04/24/13 1043 04/25/13 0518  WBC 7.6 5.4  NEUTROABS 6.3  --   HGB 14.3 12.8*  HCT 41.4 37.0*  MCV 107.0* 106.6*  PLT 139* 124*   Cardiac Enzymes:  Recent Labs Lab 04/24/13 1043  TROPONINI <0.30   BNP (last 3 results) No results found for this basename: PROBNP,  in the last 8760 hours CBG: No results found for this basename: GLUCAP,  in the last 168 hours  No results found for this or any previous visit (from the past 240 hour(s)).   Studies: Ct Head Wo Contrast  04/24/2013  *RADIOLOGY REPORT*  Clinical Data: Seizure  CT HEAD  WITHOUT CONTRAST  Technique:  Contiguous axial images were obtained from the base of the skull through the vertex without contrast.  Comparison: 10/12/2012  Findings: No skull fracture is noted.  Paranasal sinuses and mastoid air cells are unremarkable.  No intracranial hemorrhage, mass effect or midline shift.  No acute infarction.  No mass lesion is noted on this unenhanced scan.  Ventricular size is stable from prior exam.  No intra or extra-axial fluid collection.  IMPRESSION: No acute intracranial abnormality.   Original Report Authenticated By: Lahoma Crocker, M.D.    Mr Jeri Cos Wo Contrast  04/24/2013  *RADIOLOGY REPORT*  Clinical Data: History of lung cancer.  Awoke vomiting with intermittent headache.  Rule out new mass/metastatic disease.  MRI HEAD WITHOUT AND WITH CONTRAST  Technique:  Multiplanar, multiecho pulse sequences of the brain and surrounding structures were obtained according to standard protocol without and with intravenous contrast  Contrast: 89mL MULTIHANCE GADOBENATE DIMEGLUMINE 529 MG/ML IV SOLN  Comparison: 04/24/2013 head CT.  03/23/2011 brain MR.  Findings: Images are slightly motion degraded.  No acute infarct.  No intracranial hemorrhage.  No hydrocephalus.  No intracranial mass or abnormal enhancement.  Tortuous right middle cerebral artery versus aneurysm of the M1 segment/bifurcation.  MR angiogram can be obtained for further delineation.  Major intracranial vascular structures are patent.  Cervical medullary junction unremarkable.  Partially empty sella.  Pineal region and orbital structures unremarkable.  Minimal to mild mucosal thickening ethmoid sinus air cells and right maxillary sinus.  IMPRESSION: Images are slightly motion degraded.  No acute infarct.  No intracranial hemorrhage.  No intracranial mass or abnormal enhancement.  Tortuous right middle cerebral artery versus aneurysm of the M1 segment/bifurcation.  MR angiogram can be obtained for further delineation.   Original  Report Authenticated By: Genia Del, M.D.    Dg Chest Port 1 View  04/24/2013  *RADIOLOGY REPORT*  Clinical Data: Nausea, vomiting  PORTABLE CHEST - 1 VIEW  Comparison: 03/22/2013  Findings: Cardiomediastinal silhouette is stable.  There is spiculated consolidation in the right upper lobe with progression from prior exam measures about 4.1 cm.  No pulmonary edema. No segmental infiltrate.  IMPRESSION: Spiculated consolidation and right upper lobe with progression from prior exam measures about 4.1 cm.  No pulmonary edema.  No segmental infiltrate the   Original Report Authenticated By: Lahoma Crocker, M.D.      Additional labs:   Scheduled Meds: . DULoxetine  60 mg Oral q morning - 10a  . enoxaparin (LOVENOX) injection  40 mg Subcutaneous Q24H  . sodium chloride  3 mL Intravenous Q12H   Continuous Infusions: . sodium chloride 125 mL/hr at 04/24/13 2337    Active Problems:   Renal cancer   Convulsion  Additional results  Salicylate level: <2  Acetaminophen level: <15  UDS: Positive for benzodiazepines, cocaine and THC. Was positive for cocaine in 2012 too.  Blood alcohol level <11  Time spent: 30 minutes    [facility] Pager [phone].   If 8PM-8AM, please contact night-coverage at [email or web address], password [facility] 04/25/2013, 7:21 AM  LOS:  1 day

## 2013-04-25 NOTE — Progress Notes (Signed)
IP PROGRESS NOTE  Subjective:   Principle Diagnosis: This is a 47 year old gentleman with stage IV renal cell carcinoma diagnosed in 2009.   Prior Therapy:  1. Status post laparoscopic radical nephrectomy. 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 2. Patient status post thoracotomy done October 2009. He had a lower lobe nodule, biopsy proven to be metastatic renal cell carcinoma.  3. Patient is status post stereotactic radiotherapy to pulmonary nodules in May of 2010.  Patient recently progressed and and was started on Votrient.   Patient was hospitalized after an episode and nausea and vomiting and presumed seizure.   Work up so far included a CT scan and MRI (he also had an MRI on 03/2013 for headaches) did not show any evidence of intracranial mets.    Patient sleepy this am but feels well.   Objective:  Vital signs in last 24 hours: Temp:  [97.9 F (36.6 C)-98.4 F (36.9 C)] 97.9 F (36.6 C) (05/05 0700) Pulse Rate:  [56-100] 60 (05/05 0700) Resp:  [14-20] 20 (05/05 0700) BP: (108-136)/(78-92) 129/88 mmHg (05/05 0700) SpO2:  [91 %-100 %] 94 % (05/05 0700) Weight:  [223 lb 8.7 oz (101.4 kg)] 223 lb 8.7 oz (101.4 kg) (05/04 1400) Weight change:  Last BM Date: 04/23/13  Intake/Output from previous day: 05/04 0701 - 05/05 0700 In: 1818.3 [P.O.:760; I.V.:1058.3] Out: 1450 [Urine:1450]  Mouth: mucous membranes moist, pharynx normal without lesions Resp: clear to auscultation bilaterally Cardio: regular rate and rhythm, S1, S2 normal, no murmur, click, rub or gallop GI: soft, non-tender; bowel sounds normal; no masses,  no organomegaly Extremities: extremities normal, atraumatic, no cyanosis or edema  Portacath/PICC-without erythema  Lab Results:  Recent Labs  04/24/13 1043 04/25/13 0518  WBC 7.6 5.4  HGB 14.3 12.8*  HCT 41.4 37.0*  PLT 139* 124*    BMET  Recent Labs  04/24/13 1043 04/25/13 0518  NA 139 139  K 4.1 4.0  CL 104 104   CO2 31 29  GLUCOSE 124* 128*  BUN 11 7  CREATININE 1.09 0.92  CALCIUM 8.6 7.9*    Studies/Results: Ct Head Wo Contrast  04/24/2013  *RADIOLOGY REPORT*  Clinical Data: Seizure  CT HEAD WITHOUT CONTRAST  Technique:  Contiguous axial images were obtained from the base of the skull through the vertex without contrast.  Comparison: 10/12/2012  Findings: No skull fracture is noted.  Paranasal sinuses and mastoid air cells are unremarkable.  No intracranial hemorrhage, mass effect or midline shift.  No acute infarction.  No mass lesion is noted on this unenhanced scan.  Ventricular size is stable from prior exam.  No intra or extra-axial fluid collection.  IMPRESSION: No acute intracranial abnormality.   Original Report Authenticated By: Lahoma Crocker, M.D.    Mr Jeri Cos Wo Contrast  04/24/2013  *RADIOLOGY REPORT*  Clinical Data: History of lung cancer.  Awoke vomiting with intermittent headache.  Rule out new mass/metastatic disease.  MRI HEAD WITHOUT AND WITH CONTRAST  Technique:  Multiplanar, multiecho pulse sequences of the brain and surrounding structures were obtained according to standard protocol without and with intravenous contrast  Contrast: 18mL MULTIHANCE GADOBENATE DIMEGLUMINE 529 MG/ML IV SOLN  Comparison: 04/24/2013 head CT.  03/23/2011 brain MR.  Findings: Images are slightly motion degraded.  No acute infarct.  No intracranial hemorrhage.  No hydrocephalus.  No intracranial mass or abnormal enhancement.  Tortuous right middle cerebral artery versus aneurysm of the M1 segment/bifurcation.  MR angiogram can be obtained for further  delineation.  Major intracranial vascular structures are patent.  Cervical medullary junction unremarkable.  Partially empty sella.  Pineal region and orbital structures unremarkable.  Minimal to mild mucosal thickening ethmoid sinus air cells and right maxillary sinus.  IMPRESSION: Images are slightly motion degraded.  No acute infarct.  No intracranial hemorrhage.  No  intracranial mass or abnormal enhancement.  Tortuous right middle cerebral artery versus aneurysm of the M1 segment/bifurcation.  MR angiogram can be obtained for further delineation.   Original Report Authenticated By: Genia Del, M.D.    Dg Chest Port 1 View  04/24/2013  *RADIOLOGY REPORT*  Clinical Data: Nausea, vomiting  PORTABLE CHEST - 1 VIEW  Comparison: 03/22/2013  Findings: Cardiomediastinal silhouette is stable.  There is spiculated consolidation in the right upper lobe with progression from prior exam measures about 4.1 cm.  No pulmonary edema. No segmental infiltrate.  IMPRESSION: Spiculated consolidation and right upper lobe with progression from prior exam measures about 4.1 cm.  No pulmonary edema.  No segmental infiltrate the   Original Report Authenticated By: Lahoma Crocker, M.D.     Medications: I have reviewed the patient's current medications.  Assessment/Plan:  47 year old with the following issues:   1. Metastatic renal cell cancer. He is on Votrient. Well tolerated. I doubt this episode is related to his cancer or cancer medication. He can resume this medication upon discharge.   2. ? seizure episode. Imaging did not show any intracranial mets. Urine tox screen show cocaine. I wonder if this episode is related or caused by polysubstance abuse.  If his work up is unrevealing for any other pathology, I am OK with him being discharged at this time.     LOS: 1 day   [facility] 04/25/2013, 8:49 AM

## 2013-04-25 NOTE — Progress Notes (Addendum)
History: Keith [name] is an 47 y.o. 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 The patient has been undergoing radiation and chemotherapy use which for the most part is tolerated well other than some diarrhea and some headaches. 04/24/2013 at around 7:30am the patient became very nauseated, clammy, diaphoretic, and had a large amount of vomiting. The patient's wife was with him at that time. After he finished vomiting she stated that his eyes rolled back in his head, he became unresponsive, and his arms began to shake and twitch. She was not sure if there were any abnormal movements of his legs at that time. The episode lasted approximately 1-2 minutes. The patient's wife summoned EMS and the patient appeared post ictal following the episode. There was no associated incontinence. He was brought to the [facility] emergency department and admitted for further evaluation .  The patient has been having severe headaches for the past month or two. This is unusual for him and he did have an MRI of the brain last month but apparently was negative for metastatic disease.The patient has never had seizure activity in the past. He denies ever having a head injury. He denies previous history of severe headaches.   Subjective: Patient and wife in room. No further seizure episodes. Patient does complain of right sided headache but mostly complains of nausea.   Objective: BP 129/88  Pulse 60  Temp(Src) 97.9 F (36.6 C) (Oral)  Resp 20  Ht 6\' 3"  (1.905 m)  Wt 101.4 kg (223 lb 8.7 oz)  BMI 27.94 kg/m2  SpO2 94%  CBGs No results found for this basename: GLUCAP,  in the last 72 hours   Medications: Scheduled: . DULoxetine  60 mg Oral q morning - 10a  . enoxaparin (LOVENOX) injection  40 mg Subcutaneous Q24H  . sodium chloride  3 mL Intravenous Q12H      MENTAL STATUS: awake, alert, oriented, language fluent, follows simple commands.   CRANIAL NERVES: pupils equal and reactive to light,extraocular muscles intact, facial sensation and strength symmetric, uvula raise symmetrically, tongue midline  MOTOR: normal bulk and tone, Strength 5/5 throughout.  SENSORY: normal and symmetric to light touch, face and extremities throughout  COORDINATION: finger-nose-finger normal - heel to shin normal  REFLEXES: deep tendon reflexes 1+ throughout and symmetric - no babinski   Lab Results: CBC:  Recent Labs Lab 04/24/13 1043 04/25/13 0518  WBC 7.6 5.4  NEUTROABS 6.3  --   HGB 14.3 12.8*  HCT 41.4 37.0*  MCV 107.0* 106.6*  PLT 139* [id number]*   Basic Metabolic Panel:  Recent Labs Lab 04/24/13 1043 04/25/13 0518  NA 139 139  K 4.1 4.0  CL 104 104  CO2 31 29  GLUCOSE 124* 128*  BUN 11 7  CREATININE 1.09 0.92  CALCIUM 8.6 7.9*   Liver Function Tests:  Recent Labs Lab 04/24/13 1043  AST 24  ALT 35  ALKPHOS 96  BILITOT 0.3  PROT 6.9  ALBUMIN 3.4*   Hemoglobin A1C: No results found for this basename: HGBA1C,  in the last 168 hours Fasting Lipid Panel: No results found for this basename: CHOL, HDL, LDLCALC, TRIG, CHOLHDL, LDLDIRECT,  in the last 168 hours Thyroid Function Tests: No results found for this basename: TSH, T4TOTAL, FREET4, T3FREE, THYROIDAB,  in the last 168 hours Coagulation: No results found for this basename: LABPROT, INR,  in the last 168 hours   Study Results: Ct Head Wo Contrast  04/24/2013 No acute intracranial abnormality.   .    Mr Jeri Cos Wo Contrast 04/24/2013  Images are slightly motion degraded.  No acute infarct.  No intracranial hemorrhage.  No intracranial mass or abnormal enhancement.  Tortuous right middle cerebral artery versus aneurysm of the M1 segment/bifurcation.  MR angiogram can be obtained for further delineation.     Dg Chest Port 1 View 04/24/2013 Spiculated consolidation and right upper lobe with progression from prior exam measures about 4.1 cm.  No pulmonary edema.     Assessment/Plan:  47yo male cancer patient with polysubstance abuse admitted with seizure like activity (none PTA). Now denies any further activity-so far has been treated with ativan. EEG remains pending. Will make further recommendations at that time.   Plans discussed with patient and family, 50 minutes time, > 1/2 counselling   LOS: 1 day  Wayland Denis, [place], [facility] Pager [phone] 04/25/2013  11:44 AM

## 2013-04-25 NOTE — Care Management Note (Addendum)
    Page 1 of 1   04/26/2013     2:59:24 PM   CARE MANAGEMENT NOTE 04/26/2013  Patient:  Bushard,Horacio   Account Number:  [id number]  Date Initiated:  04/25/2013  Documentation initiated by:  Dessa Phi  Subjective/Objective Assessment:   ADMITTED W/SEIZURE.CN:[id number] CA,LUNG CA     Action/Plan:   FROM HOME.HAS PCP,PHARMACY.   Anticipated DC Date:  04/26/2013   Anticipated DC Plan:  [place]  CM consult      Choice offered to / List presented to:             Status of service:  Completed, signed off Medicare Important Message given?   (If response is "NO", the following Medicare IM given date fields will be blank) Date Medicare IM given:   Date Additional Medicare IM given:    Discharge Disposition:  HOME/SELF CARE  Per UR Regulation:  Reviewed for med. necessity/level of care/duration of stay  If discussed at [place] of Stay Meetings, dates discussed:    Comments:  04/25/13 Mara Favero RN,BSN NCM [phone] NEURO CONS.

## 2013-04-26 ENCOUNTER — Ambulatory Visit: Payer: BC Managed Care – PPO | Admitting: Oncology

## 2013-04-26 LAB — CBC
HCT: 36.9 % — ABNORMAL LOW (ref 39.0–52.0)
Hemoglobin: 12.3 g/dL — ABNORMAL LOW (ref 13.0–17.0)
MCH: 35.7 pg — ABNORMAL HIGH (ref 26.0–34.0)
MCHC: 33.3 g/dL (ref 30.0–36.0)
MCV: 107 fL — ABNORMAL HIGH (ref 78.0–100.0)
Platelets: 110 10*3/uL — ABNORMAL LOW (ref 150–400)
RBC: 3.45 MIL/uL — ABNORMAL LOW (ref 4.22–5.81)
RDW: 13.9 % (ref 11.5–15.5)
WBC: 5.9 10*3/uL (ref 4.0–10.5)

## 2013-04-26 NOTE — Discharge Summary (Signed)
Physician Discharge Summary  Keith [name] [name] DOB: [birth year] DOA: 04/24/2013  PCP: [name], MD  Admit date: 04/24/2013 Discharge date: 04/26/2013  Time spent: Less than 30 minutes  Recommendations for Outpatient Follow-up:  1. Dr. [name], PCP in 1 week with repeat labs (CBC) 2. Dr. Zola Button, Oncology. 3. [facility].  Discharge Diagnoses:  Active Problems:   Renal cancer   Convulsion   Nausea and vomiting   Polysubstance abuse-cocaine and THC.   Thrombocytopenia, unspecified   Discharge Condition: Improved & Stable  Diet recommendation: Heart Healthy diet.  Filed Weights   04/24/13 1400  Weight: 101.4 kg (223 lb 8.7 oz)    History of present illness:  47 year old male patient with history of stage IV renal cell carcinoma, status post laparoscopic radical left nephrectomy, status post thoracotomy for left lower lobe lung nodule, treated with stereotactic radiotherapy, recent progression of cancer and started on Votrient, polysubstance abuse, was admitted on 04/24/2013 with complaints of nausea, vomiting (not his usual symptoms post chemotherapy) followed by an episode of generalized seizure like activity which lasted for approximately 2 minutes followed by postictal headache and drowsiness. Hospitalist admission requested. Patient denies prior history of seizures.  Hospital Course:  1. Seizure-like activity x1 on 04/24/13: MRI brain negative for metastasis or acute findings. EEG negative for seizure like activity. Neurology consultation and followup appreciated. AED not started. Per oncology, not likely to be due to chemotherapy or cancer.? Related to polysubstance abuse. Cessation counseled. Postictal headache. No driving and has to be seizure-free for 6 months at least. 2. Nausea and vomiting: Unclear etiology-? Chemotherapy/acute GE. Resolved 3. Polysubstance abuse: UDS positive for cocaine and THC. Cessation counseled. Patient  denies Cocaine use. 4. Thrombocytopenia: Chronic. Stable. OP follow up with Oncology. 5. Stage IV renal cell carcinoma: Oncology input appreciated. Outpatient followup   Procedures:  EEG: INTERPRETATION: The EEG showed minimal generalized continuous nonspecific slowing of cerebral activity. No evidence of an epileptic disorder was demonstrated.  Consultations:  Neurology  Oncology  Discharge Exam:  Complaints: Denies Complaints. No further seizure like activity of headache.  Filed Vitals:   04/25/13 2224 04/25/13 2230 04/26/13 0506 04/26/13 1300  BP: 140/87  135/86   Pulse: 58  65   Temp: 98.9 F (37.2 C)  98 F (36.7 C)   TempSrc: Oral  Oral   Resp: 22  20   Height:      Weight:      SpO2: 87% 99% 98% 95%    General exam: Comfortable.  Respiratory system: Clear. No increased work of breathing.  Cardiovascular system: S1 & S2 heard, RRR. No JVD, murmurs, gallops, clicks or pedal edema. Telemetry: Sinus rhythm with occasional PVCs.  Gastrointestinal system: Abdomen is nondistended, soft and nontender. Normal bowel sounds heard.  Central nervous system: Alert and oriented. No focal neurological deficits.  Extremities: Symmetric 5 x 5 power.  Discharge Instructions      Discharge Orders   Future Appointments Provider Department Dept Phone   05/19/2013 2:00 PM [name], MD [place] [phone]   Future Orders Complete By Expires     Call MD for:  As directed     Comments:      Seizure-like activity    Diet - low sodium heart healthy  As directed     Discharge instructions  As directed     Comments:      No driving heavy machinery or automobiles, no climbing heights or staying in  standing water (includes bathtubs, swimming pools, hot tubs, etc) for 6-12 months or until cleared by neurologist/primary care MD.    Increase activity slowly  As directed         Medication List    STOP taking these medications        pseudoephedrine-acetaminophen 30-500 MG Tabs  Commonly known as:  TYLENOL SINUS      TAKE these medications       ALPRAZolam 1 MG tablet  Commonly known as:  XANAX  Take 1 tablet (1 mg total) by mouth every 8 (eight) hours as needed. Anxiety     DULoxetine 60 MG capsule  Commonly known as:  CYMBALTA  Take 60 mg by mouth every morning.     oxyCODONE 5 MG immediate release tablet  Commonly known as:  Oxy IR/ROXICODONE  Take 1-4 tablets (5-20 mg total) by mouth every 4 (four) hours as needed for pain.     pazopanib 200 MG tablet  Commonly known as:  VOTRIENT  Take 800 mg by mouth every morning. Take on an empty stomach.       Follow-up Information   Follow up with [name], MD. Schedule an appointment as soon as possible for a visit in 1 week.   Contact information:   [address] [place] [address] [phone]       Schedule an appointment as soon as possible for a visit with [facility], MD.   Contact information:   [address] [place] [address] [phone]       Schedule an appointment as soon as possible for a visit with [facility].   Contact information:   [place] [place] [address] [phone]        The results of significant diagnostics from this hospitalization (including imaging, microbiology, ancillary and laboratory) are listed below for reference.    Significant Diagnostic Studies: Ct Head Wo Contrast  04/24/2013  *RADIOLOGY REPORT*  Clinical Data: Seizure  CT HEAD WITHOUT CONTRAST  Technique:  Contiguous axial images were obtained from the base of the skull through the vertex without contrast.  Comparison: 10/12/2012  Findings: No skull fracture is noted.  Paranasal sinuses and mastoid air cells are unremarkable.  No intracranial hemorrhage, mass effect or midline shift.  No acute infarction.  No mass lesion is noted on this unenhanced scan.  Ventricular size is stable from  prior exam.  No intra or extra-axial fluid collection.  IMPRESSION: No acute intracranial abnormality.   Original Report Authenticated By: Lahoma Crocker, M.D.    Mr Keith [name] Wo Contrast  04/24/2013  *RADIOLOGY REPORT*  Clinical Data: History of lung cancer.  Awoke vomiting with intermittent headache.  Rule out new mass/metastatic disease.  MRI HEAD WITHOUT AND WITH CONTRAST  Technique:  Multiplanar, multiecho pulse sequences of the brain and surrounding structures were obtained according to standard protocol without and with intravenous contrast  Contrast: 67mL MULTIHANCE GADOBENATE DIMEGLUMINE 529 MG/ML IV SOLN  Comparison: 04/24/2013 head CT.  03/23/2011 brain MR.  Findings: Images are slightly motion degraded.  No acute infarct.  No intracranial hemorrhage.  No hydrocephalus.  No intracranial mass or abnormal enhancement.  Tortuous right middle cerebral artery versus aneurysm of the M1 segment/bifurcation.  MR angiogram can be obtained for further delineation.  Major intracranial vascular structures are patent.  Cervical medullary junction unremarkable.  Partially empty sella.  Pineal region and orbital structures unremarkable.  Minimal to mild mucosal thickening ethmoid sinus air  cells and right maxillary sinus.  IMPRESSION: Images are slightly motion degraded.  No acute infarct.  No intracranial hemorrhage.  No intracranial mass or abnormal enhancement.  Tortuous right middle cerebral artery versus aneurysm of the M1 segment/bifurcation.  MR angiogram can be obtained for further delineation.   Original Report Authenticated By: Genia Del, M.D.     Dg Chest Port 1 View  04/24/2013  *RADIOLOGY REPORT*  Clinical Data: Nausea, vomiting  PORTABLE CHEST - 1 VIEW  Comparison: 03/22/2013  Findings: Cardiomediastinal silhouette is stable.  There is spiculated consolidation in the right upper lobe with progression from prior exam measures about 4.1 cm.  No pulmonary edema. No segmental infiltrate.  IMPRESSION:  Spiculated consolidation and right upper lobe with progression from prior exam measures about 4.1 cm.  No pulmonary edema.  No segmental infiltrate the   Original Report Authenticated By: Lahoma Crocker, M.D.     Microbiology: No results found for this or any previous visit (from the past 240 hour(s)).   Labs: Basic Metabolic Panel:  Recent Labs Lab 04/24/13 1043 04/25/13 0518  NA 139 139  K 4.1 4.0  CL 104 104  CO2 31 29  GLUCOSE 124* 128*  BUN 11 7  CREATININE 1.09 0.92  CALCIUM 8.6 7.9*   Liver Function Tests:  Recent Labs Lab 04/24/13 1043  AST 24  ALT 35  ALKPHOS 96  BILITOT 0.3  PROT 6.9  ALBUMIN 3.4*    Recent Labs Lab 04/24/13 1043  LIPASE 36   No results found for this basename: AMMONIA,  in the last 168 hours CBC:  Recent Labs Lab 04/24/13 1043 04/25/13 0518 04/26/13 0519  WBC 7.6 5.4 5.9  NEUTROABS 6.3  --   --   HGB 14.3 12.8* 12.3*  HCT 41.4 37.0* 36.9*  MCV 107.0* 106.6* 107.0*  PLT 139* 124* 110*   Cardiac Enzymes:  Recent Labs Lab 04/24/13 1043  TROPONINI <0.30   BNP: BNP (last 3 results) No results found for this basename: PROBNP,  in the last 8760 hours CBG: No results found for this basename: GLUCAP,  in the last 168 hours  Additional labs:  Salicylate level: <2  Acetaminophen level: <15  UDS positive for benzodiazepines, cocaine and THC   Signed:  Daaiel Starlin  [facility] 04/26/2013, 2:10 PM

## 2013-04-26 NOTE — Progress Notes (Signed)
History: Keith [name] is an 47 y.o. 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 The patient has been undergoing radiation and chemotherapy use which for the most part is tolerated well other than some diarrhea and some headaches. 04/24/2013 at around 7:30am the patient became very nauseated, clammy, diaphoretic, and had a large amount of vomiting. The patient's wife was with him at that time. After he finished vomiting she stated that his eyes rolled back in his head, he became unresponsive, and his arms began to shake and twitch. She was not sure if there were any abnormal movements of his legs at that time. The episode lasted approximately 1-2 minutes. The patient's wife summoned EMS and the patient appeared post ictal following the episode. There was no associated incontinence. He was brought to the [facility] emergency department and admitted for further evaluation .  The patient has been having severe headaches for the past month or two. This is unusual for him and he did have an MRI of the brain last month but apparently was negative for metastatic disease.The patient has never had seizure activity in the past. He denies ever having a head injury. He denies previous history of severe headaches.   Subjective: Wife in room. Patient lying in bed. No further seizure episodes. No complaint of headache today. He wants to go home.   Objective: BP 135/86  Pulse 65  Temp(Src) 98 F (36.7 C) (Oral)  Resp 20  Ht 6\' 3"  (1.905 m)  Wt 101.4 kg (223 lb 8.7 oz)  BMI 27.94 kg/m2  SpO2 98%  CBGs No results found for this basename: GLUCAP,  in the last 72 hours   Medications: Scheduled: . DULoxetine  60 mg Oral q morning - 10a  . enoxaparin (LOVENOX) injection  40 mg Subcutaneous Q24H  . sodium chloride  3 mL Intravenous Q12H      MENTAL STATUS: awake, alert, oriented, language fluent, follows simple commands.  CRANIAL NERVES:  pupils equal and reactive to light,extraocular muscles intact, facial sensation and strength symmetric, tongue midline  MOTOR: normal bulk and tone, Strength 5/5 throughout.  SENSORY: normal and symmetric to light touch, face and extremities throughout  COORDINATION: finger-nose-finger normal - heel to shin normal  REFLEXES: deep tendon reflexes 1+ throughout and symmetric   Lab Results: CBC:  Recent Labs Lab 04/24/13 1043 04/25/13 0518 04/26/13 0519  WBC 7.6 5.4 5.9  NEUTROABS 6.3  --   --   HGB 14.3 12.8* 12.3*  HCT 41.4 37.0* 36.9*  MCV 107.0* 106.6* 107.0*  PLT 139* 124* [id number]*   Basic Metabolic Panel:  Recent Labs Lab 04/24/13 1043 04/25/13 0518  NA 139 139  K 4.1 4.0  CL 104 104  CO2 31 29  GLUCOSE 124* 128*  BUN 11 7  CREATININE 1.09 0.92  CALCIUM 8.6 7.9*   Liver Function Tests:  Recent Labs Lab 04/24/13 1043  AST 24  ALT 35  ALKPHOS 96  BILITOT 0.3  PROT 6.9  ALBUMIN 3.4*   Hemoglobin A1C: No results found for this basename: HGBA1C,  in the last 168 hours Fasting Lipid Panel: No results found for this basename: CHOL, HDL, LDLCALC, TRIG, CHOLHDL, LDLDIRECT,  in the last 168 hours Thyroid Function Tests: No results found for this basename: TSH, T4TOTAL, FREET4, T3FREE, THYROIDAB,  in the last 168 hours Coagulation: No results found for this basename: LABPROT, INR,  in the last 168 hours   Study Results:  Ct Head Wo Contrast 04/24/2013 No acute intracranial abnormality.   .    Mr Jeri Cos Wo Contrast 04/24/2013  Images are slightly motion degraded.  No acute infarct.  No intracranial hemorrhage.  No intracranial mass or abnormal enhancement.  Tortuous right middle cerebral artery versus aneurysm of the M1 segment/bifurcation.  MR angiogram can be obtained for further delineation.     Dg Chest Port 1 View 04/24/2013 Spiculated consolidation and right upper lobe with progression from prior exam measures about 4.1 cm.  No pulmonary edema.   EEG: The EEG  showed minimal generalized continuous nonspecific slowing of cerebral activity. No evidence of an epileptic  disorder was demonstrated.   Assessment/Plan:  47yo male cancer patient with polysubstance abuse admitted with a single episode seizure like activity (none PTA). Now denies any further activity-so far has been treated with ativan if needed. EEG shows no epileptic waves. I have asked the patient to make appt to see Dr Shelia Media within the next 1-2 weeks. If he does have another episode despite these EEG findings, I suspect he will need to be starting on Keppra. I would like for him to make an appt to see neuology in 6 weeks to establish care within the community.  No driving heavy machinery or automobiles, no climbing heights or staying in standing water (includes bathtubs, swimming pools, hot tubs, etc) for 6-12 months or until cleared by neurologist/primary care MD. I have discussed this with the patient and wife. He says he does not drive except for a lawn mower and I do not want him to do this at this time.  Plans discussed with patient and family, 40 minutes time, > 1/2 counselling+xray review+EEG review, medical team review and discussion. Plans are for him to go home today.   LOS: 2 days  Wayland Denis, MBA, [facility] Pager [phone] 04/26/2013  10:57 AM

## 2013-04-26 NOTE — Procedures (Signed)
EEG NUMBER:  14 - [id number].  INDICATION FOR STUDY:  A 47 year old man with a history of renal cell carcinoma with metastasis to the lung, pancreas in the sacrum who presented yesterday with new onset seizure activity, and altered mental status.  DESCRIPTION:  This is a routine EEG recording performed during wakefulness and during light sleep.  Predominant background activity during wakefulness consisted of low amplitude, 1-2 Hz diffuse symmetrical continuous delta activity with superimposed 9 Hz alpha rhythm recorded from the posterior head regions.  Photic stimulation was not performed.  Hyperventilation was not performed.  During light sleep, there was generalized slowing of background activity with mixed irregular delta and theta activity.  Occasional sleep spindles and arousal responses were noted.  No epileptiform discharges were seen. There was no area of disproportionate focal slowing.  INTERPRETATION:  The EEG showed minimal generalized continuous nonspecific slowing of cerebral activity.  No evidence of an epileptic disorder was demonstrated.     Wallie Char, MD    [id number] D:  04/25/2013 17:47:21  T:  04/26/2013 02:35:22  Job #:  [id number]

## 2013-04-28 ENCOUNTER — Telehealth: Payer: Self-pay | Admitting: Oncology

## 2013-04-29 NOTE — Telephone Encounter (Signed)
RECEIVED A FAX FROM [employer] CONCERNING A CONFIRMATION OF PRESCRIPTION SHIPMENT FOR VOTRIENT ON 04/28/13.

## 2013-04-30 IMAGING — CT CT HEAD WO/W CM
2 of 4 series · 16 of 40 positions shown, 20 images · IV contrast (75CC OMNI 300)
Comparison: CT head without contrast 03/25/2011.

CLINICAL DATA: Renal cell carcinoma.

CT HEAD WITHOUT AND WITH CONTRAST
TECHNIQUE: Contiguous axial images were obtained from the base of
the skull through the vertex without and with intravenous contrast.
Contrast: 75 ml Omnipaque 300

[Series 104: head with thins · axial · 0.49mm/px · z∈[+53,+144]mm · 13 of 160 slices shown, 17 images]
[im 8/160  brain]
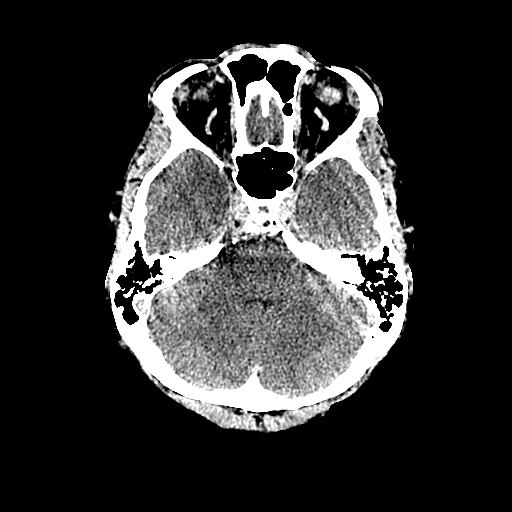
[im 8/160  bone]
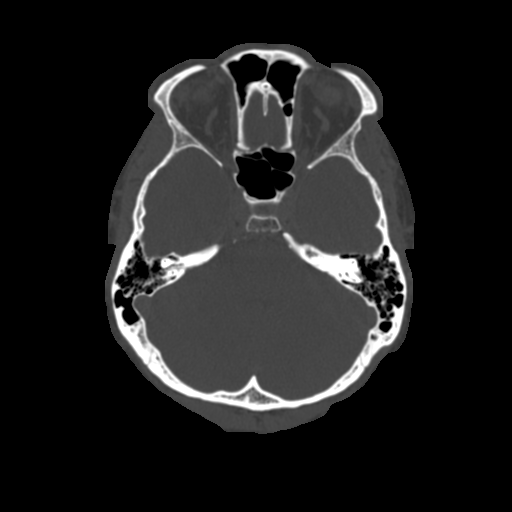
[im 23/160  brain]
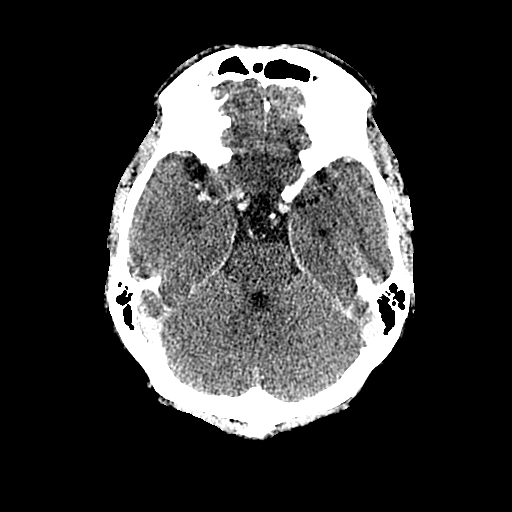
[im 31/160  brain]
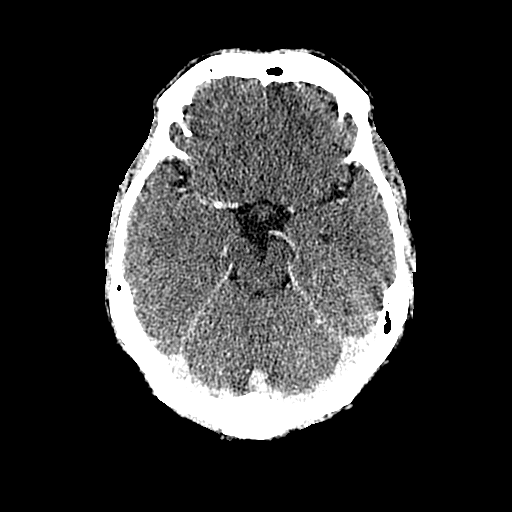
[im 46/160  brain]
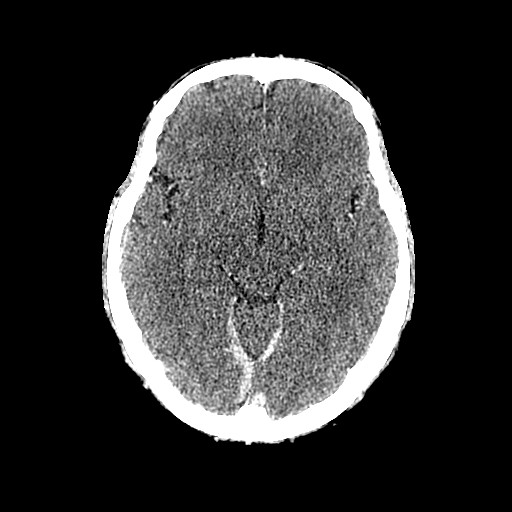
[im 54/160  brain]
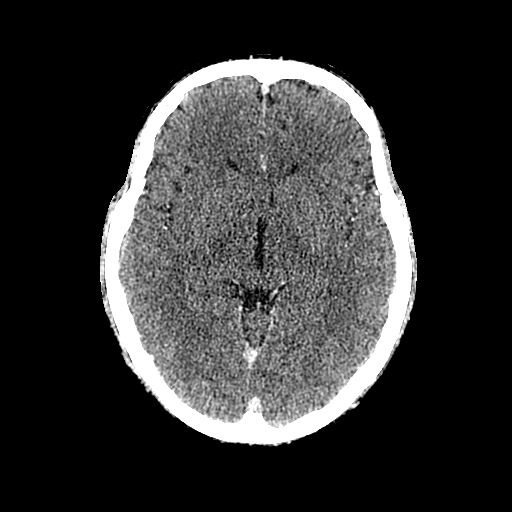
[im 54/160  bone]
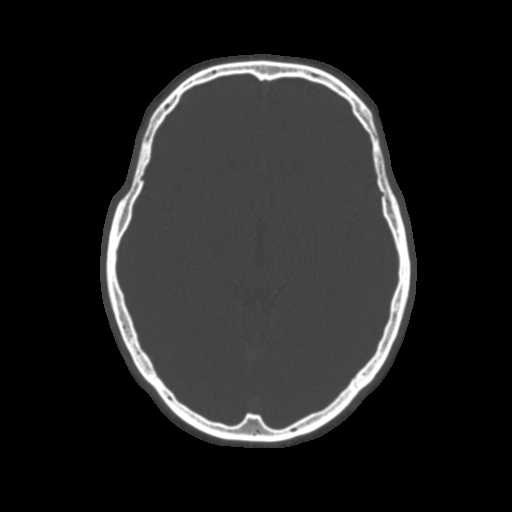
[im 69/160  brain]
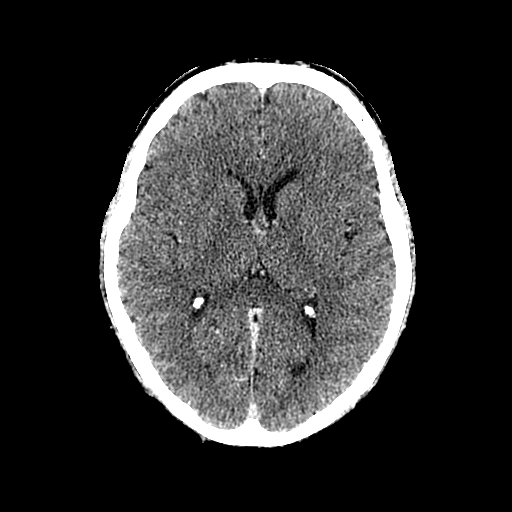
[im 84/160  brain]
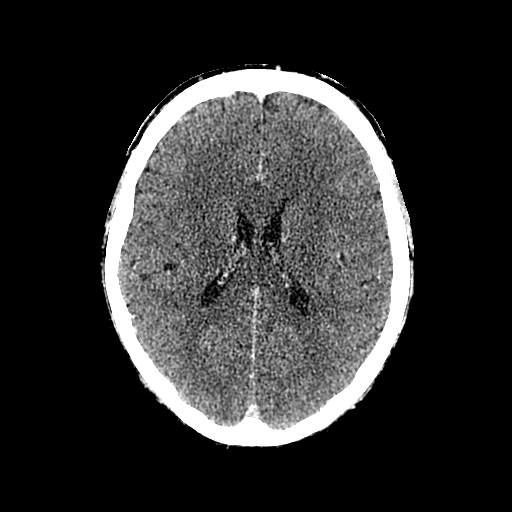
[im 91/160  brain]
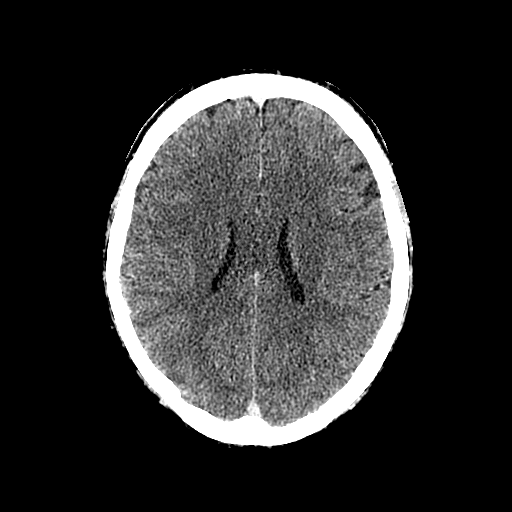
[im 107/160  brain]
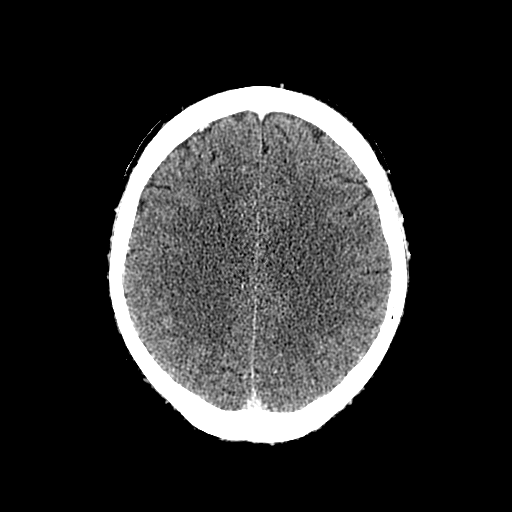
[im 107/160  bone]
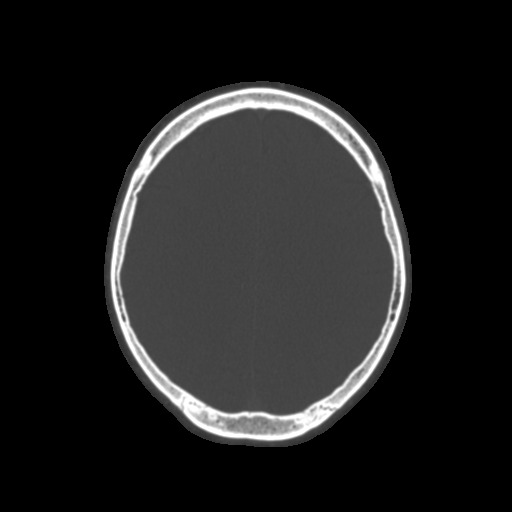
[im 114/160  brain]
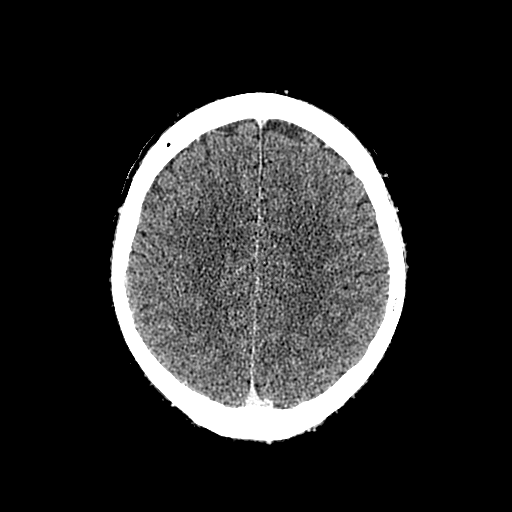
[im 129/160  brain]
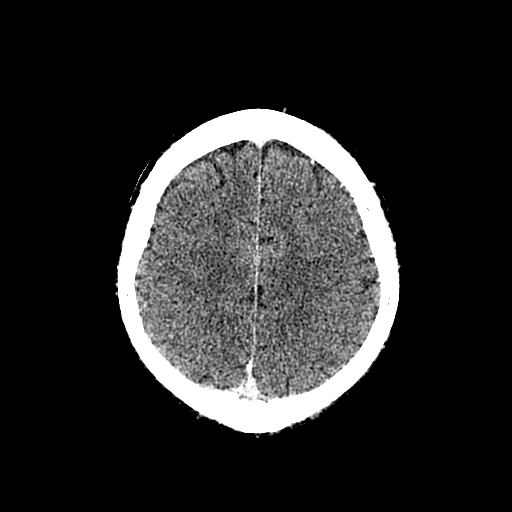
[im 137/160  brain]
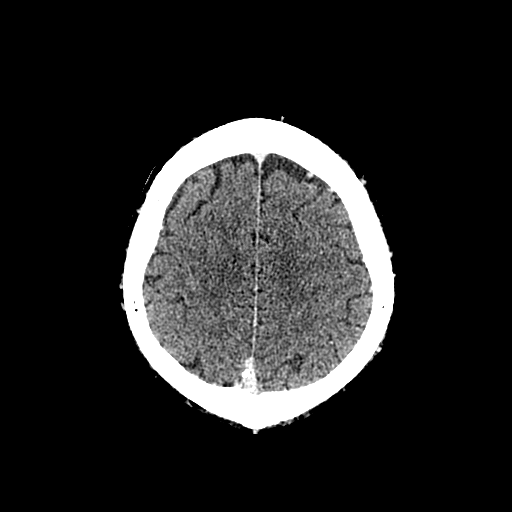
[im 152/160  brain]
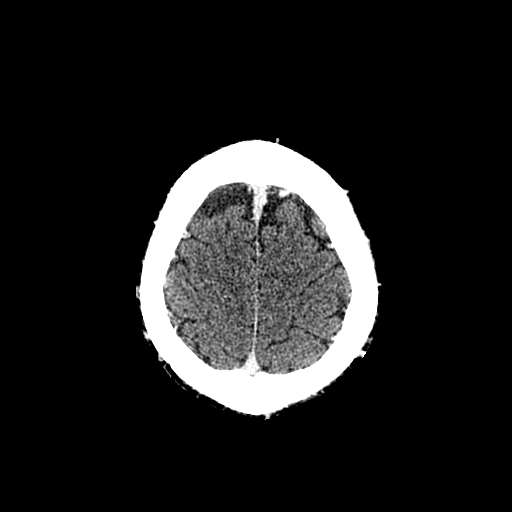
[im 152/160  bone]
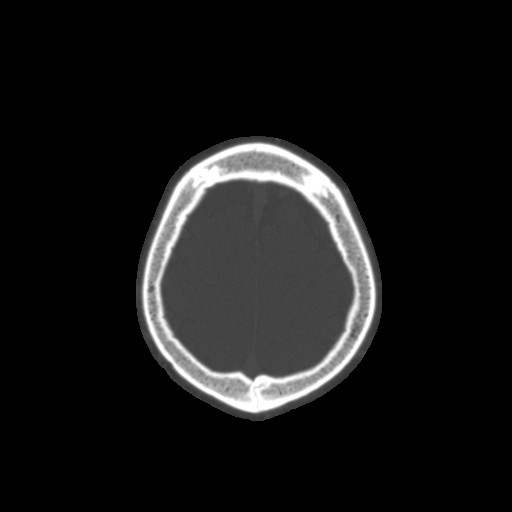

[Series 105: coronal thins · coronal · 0.49mm/px · 3 of 45 slices shown]
[im 15/45  brain]
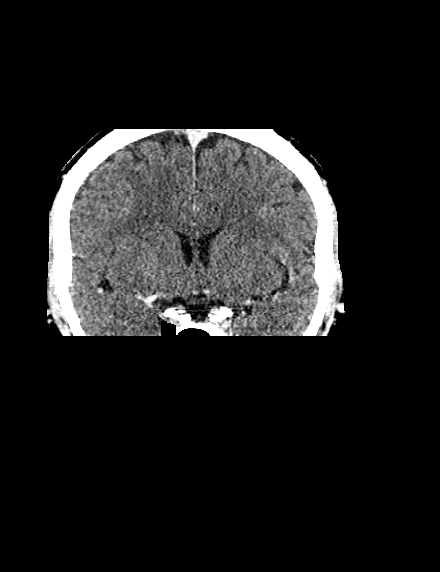
[im 20/45  brain]
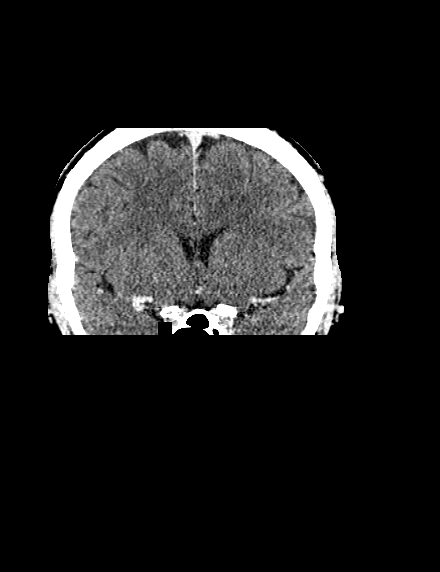
[im 25/45  brain]
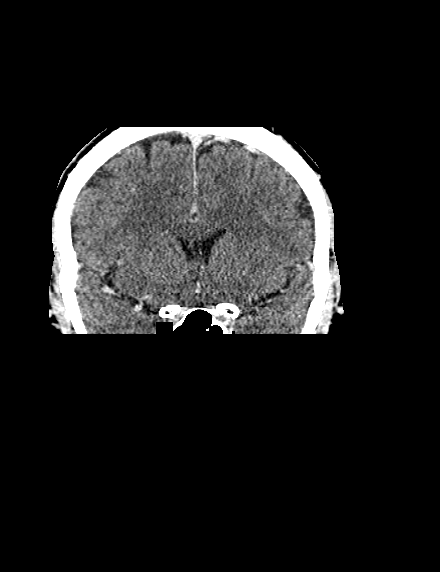

[16 of 40 positions shown; findings below may reference images not displayed]

FINDINGS: The noncontrast images the head demonstrate no
significant interval change.  No acute infarct, hemorrhage, mass
lesion is present.  The postcontrast images demonstrate no areas of
pathologic enhancement.

There is prominent enhancement level of the right M1 segment.  Thin
reformats and coronal reformats were obtained.  These demonstrate
to mild fusiform dilation of the early the inferior MCA branch.  No
berry aneurysm is evident.
IMPRESSION: 1.  No evidence of metastatic disease to the brain.
2.  Fusiform dilation of a proximal inferior right M2 branch.

## 2013-05-12 ENCOUNTER — Telehealth: Payer: Self-pay | Admitting: *Deleted

## 2013-05-12 NOTE — Telephone Encounter (Signed)
Patient calling to request a note to return to work may 27,2014. Ok per [name], note left at front for patient p/u. Patient notified.

## 2013-05-19 ENCOUNTER — Ambulatory Visit: Payer: BC Managed Care – PPO | Admitting: Radiation Oncology

## 2013-05-24 ENCOUNTER — Other Ambulatory Visit (HOSPITAL_BASED_OUTPATIENT_CLINIC_OR_DEPARTMENT_OTHER): Payer: BC Managed Care – PPO | Admitting: Lab

## 2013-05-24 ENCOUNTER — Other Ambulatory Visit: Payer: Self-pay | Admitting: *Deleted

## 2013-05-24 ENCOUNTER — Telehealth: Payer: Self-pay | Admitting: Oncology

## 2013-05-24 ENCOUNTER — Ambulatory Visit (HOSPITAL_BASED_OUTPATIENT_CLINIC_OR_DEPARTMENT_OTHER): Payer: BC Managed Care – PPO | Admitting: Oncology

## 2013-05-24 VITALS — BP 151/88 | HR 88 | Temp 98.1°F | Resp 18 | Ht 75.0 in | Wt 216.8 lb

## 2013-05-24 DIAGNOSIS — M899 Disorder of bone, unspecified: Secondary | ICD-10-CM

## 2013-05-24 DIAGNOSIS — C649 Malignant neoplasm of unspecified kidney, except renal pelvis: Secondary | ICD-10-CM

## 2013-05-24 DIAGNOSIS — F341 Dysthymic disorder: Secondary | ICD-10-CM

## 2013-05-24 DIAGNOSIS — C7889 Secondary malignant neoplasm of other digestive organs: Secondary | ICD-10-CM

## 2013-05-24 LAB — CBC WITH DIFFERENTIAL/PLATELET
BASO%: 0.5 % (ref 0.0–2.0)
Basophils Absolute: 0 10*3/uL (ref 0.0–0.1)
EOS%: 1.1 % (ref 0.0–7.0)
Eosinophils Absolute: 0.1 10*3/uL (ref 0.0–0.5)
HCT: 42.2 % (ref 38.4–49.9)
HGB: 14.4 g/dL (ref 13.0–17.1)
LYMPH%: 26.3 % (ref 14.0–49.0)
MCH: 36.2 pg — ABNORMAL HIGH (ref 27.2–33.4)
MCHC: 34.1 g/dL (ref 32.0–36.0)
MCV: 105.9 fL — ABNORMAL HIGH (ref 79.3–98.0)
MONO#: 0.5 10*3/uL (ref 0.1–0.9)
MONO%: 7.4 % (ref 0.0–14.0)
NEUT#: 4.2 10*3/uL (ref 1.5–6.5)
NEUT%: 64.7 % (ref 39.0–75.0)
Platelets: 139 10*3/uL — ABNORMAL LOW (ref 140–400)
RBC: 3.98 10*6/uL — ABNORMAL LOW (ref 4.20–5.82)
RDW: 14.6 % (ref 11.0–14.6)
WBC: 6.4 10*3/uL (ref 4.0–10.3)
lymph#: 1.7 10*3/uL (ref 0.9–3.3)

## 2013-05-24 LAB — COMPREHENSIVE METABOLIC PANEL (CC13)
ALT: 50 U/L (ref 0–55)
AST: 31 U/L (ref 5–34)
Albumin: 3.9 g/dL (ref 3.5–5.0)
Alkaline Phosphatase: 95 U/L (ref 40–150)
BUN: 10.6 mg/dL (ref 7.0–26.0)
CO2: 24 mEq/L (ref 22–29)
Calcium: 9 mg/dL (ref 8.4–10.4)
Chloride: 106 mEq/L (ref 98–107)
Creatinine: 1.2 mg/dL (ref 0.7–1.3)
Glucose: 84 mg/dl (ref 70–99)
Potassium: 4 mEq/L (ref 3.5–5.1)
Sodium: 140 mEq/L (ref 136–145)
Total Bilirubin: 0.58 mg/dL (ref 0.20–1.20)
Total Protein: 7.6 g/dL (ref 6.4–8.3)

## 2013-05-24 MED ORDER — MAGNESIUM HYDROXIDE 400 MG/5ML PO SUSP: 15.0000 mL | Freq: Every day | ORAL | Status: DC | PRN

## 2013-05-24 MED ORDER — PAZOPANIB HCL 200 MG PO TABS: 800.0000 mg | ORAL_TABLET | Freq: Every morning | ORAL | Status: DC

## 2013-05-24 MED ORDER — ALPRAZOLAM 1 MG PO TABS: 1.0000 mg | ORAL_TABLET | Freq: Three times a day (TID) | ORAL | Status: DC | PRN

## 2013-05-24 NOTE — Progress Notes (Signed)
Hematology and Oncology Follow Up Visit  Keith [name] [id number] 23-Aug-1966 47 y.o. 05/24/2013 3:05 PM  Keith Bring, MD  Keith [name], M.D.  Keith Bent, MD  Keith Banister, MD    Principle Diagnosis: This is a 47 year old gentleman with stage IV renal cell carcinoma diagnosed in 2009.  Prior Therapy: 1. Status post laparoscopic radical nephrectomy.  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 2. Patient status post thoracotomy done October 2009.  He had a lower lobe nodule, biopsy proven to be metastatic renal cell carcinoma.   3. Patient is status post stereotactic radiotherapy to pulmonary nodules in May of 2010. 4. He is S/P Sutent 50 mg 4 weeks on 2 weeks off from November of 2011 to 03/2013. He progressed at that time.  5. He is S/P radiation to the right sacral bone between 4/22 to 4/30.    Current therapy: He is on Votrient 800 mg since 03/2013. His disease include pancreatic involvement with RCC as well as sacral lesion.   Interim History:  Keith [name] presents today for a followup visit.  He has tolerated Sutent very well without any complications this month. He was hospitalized between 5/4 to 5/6 for a presumed seizure activity. His work up was negative including an MRI. He is reporting discomfort arounfd his abdominal. He is constipated and has not had a bowel movent in 4-5 days. He did report grade 1 hand-foot syndrome. His pain in the his feet as well as the ankles. Has not reported any other complications. He is no longer taking Cymbalta.  He is not reporting any neurological symptoms. He is back to work at this time.   Medications: I have reviewed the patient's current medications.  Current Outpatient Prescriptions  Medication Sig Dispense Refill  . ALPRAZolam (XANAX) 1 MG tablet Take 1 tablet (1 mg total) by mouth every 8 (eight) hours as needed. Anxiety  30 tablet  1  . DULoxetine (CYMBALTA) 60 MG capsule Take 60 mg by mouth every morning.        [name] oxyCODONE (OXY IR/ROXICODONE) 5 MG immediate release tablet Take 1-4 tablets (5-20 mg total) by mouth every 4 (four) hours as needed for pain.  120 tablet  0  . pazopanib (VOTRIENT) 200 MG tablet Take 4 tablets (800 mg total) by mouth every morning. Take on an empty stomach.  120 tablet  0  . magnesium hydroxide (MILK OF MAGNESIA) 400 MG/5ML suspension Take 15 mLs by mouth daily as needed for constipation.  360 mL  0   No current facility-administered medications for this visit.     Allergies:  Allergies  Allergen Reactions  . Ceftriaxone Hives  . Hydrocodone Swelling    Past Medical History, Surgical history, Social history, and Family History were reviewed and updated.  Review of Systems: Constitutional:  Negative for fever, chills, night sweats, anorexia, weight loss, pain. Cardiovascular: no chest pain or dyspnea on exertion Respiratory: no cough, shortness of breath, or wheezing Neurological: no TIA or stroke symptoms Dermatological: negative ENT: negative Skin: Negative. Gastrointestinal: no abdominal pain, change in bowel habits, or black or bloody stools Genito-Urinary: no dysuria, trouble voiding, or hematuria Hematological and Lymphatic: negative Breast: negative Musculoskeletal: negative for - joint swelling, muscle pain or pain in foot - bilateral Remaining ROS negative.  Physical Exam: Blood pressure 151/88, pulse 88, temperature 98.1 F (36.7 C), temperature source Oral, resp. rate 18, height 6\' 3"  (1.905 m), weight 216 lb 12.8 oz (98.34 kg). ECOG: 1  General appearance: alert Head: Normocephalic, without obvious abnormality, atraumatic Neck: no adenopathy, no carotid bruit, no JVD, supple, symmetrical, trachea midline and thyroid not enlarged, symmetric, no tenderness/mass/nodules Lymph nodes: Cervical, supraclavicular, and axillary nodes normal. Heart:regular rate and rhythm, S1, S2 normal, no murmur, click, rub or gallop Lung:chest clear, no wheezing,  rales, normal symmetric air entry. Chest wall pain is reproducible with pressure. Abdomen: soft, non-tender, without masses or organomegaly EXT:no edema, no despumation. No erythema noted. Tender to touch at the ankle and shin area on both sides.   Neuro: no focal deficits noted. No weakness noted in his LE  Lab Results: Lab Results  Component Value Date   WBC 6.4 05/24/2013   HGB 14.4 05/24/2013   HCT 42.2 05/24/2013   MCV 105.9* 05/24/2013   PLT 139* 05/24/2013     Impression and Plan:  This is a pleasant 47 year old gentleman with the following issues. 1. Metastatic renal cell carcinoma.  He has documented disease to the lung and the pancreas.  He tolerating Votrient without complications. I will restage him with a CT scan after this month.  2. Pancreatic thickening/mass.  This has been biopsy proven to be a renal cell metastasis. No change in treatment at this time. 3. Depression/Anxiety: More irritable. Plans to restart Cymbalta. 4. Sacral mass: S/P radiation therapy. No pain at this time.  5. Constipation: I gave Rx for a Milk of Magnesia.   6. Follow up: 4 weeks.  [facility] 6/3/20143:05 PM

## 2013-05-24 NOTE — Telephone Encounter (Signed)
gv and printed appt sched and avs for pt...gv pt barium

## 2013-05-24 NOTE — Telephone Encounter (Signed)
THIS REFILL REQUEST FOR VOTRIENT WAS PLACED IN DR.SHADAD'S ACTIVE WORK FOLDER. 

## 2013-05-31 NOTE — Telephone Encounter (Signed)
RECEIVED A FAX FROM [employer] CONCERNING A CONFIRMATION OF PRESCRIPTION SHIPMENT FOR VOTRIENT ON 05/30/13.

## 2013-06-04 IMAGING — CT CT CHEST W/ CM
1 series · 14 of 32 positions shown, 18 images · IV contrast (READICAT/WATER & [ID] OMNI 300)
Comparison: [HOSPITAL] at [REDACTED] [HOSPITAL] chest, abdomen,
pelvic CT [DATE], 03/07/2011, [HOSPITAL]
nuclear medicine PET-CT 05/11/2009, chest, abdomen, pelvic CT
01/04/2010.

CT CHEST

CLINICAL DATA: Right lung cancer/right lower lobectomy, left renal
cell cancer with nephrectomy. Nonsmoker.  Occasional diarrhea.
Ongoing chemotherapy.

CT CHEST, ABDOMEN AND PELVIS WITH CONTRAST
TECHNIQUE: Multidetector CT imaging of the chest, abdomen and
pelvis was performed following the standard protocol during bolus
administration of intravenous contrast.
Contrast: Intravenous 100 ml Jmnipaque-LQQ.

[Series 5: renal delay · axial · delayed · 0.72mm/px · z∈[-505,-320]mm · 14 of 42 slices shown, 18 images]
[im 3/42  soft-tissue]
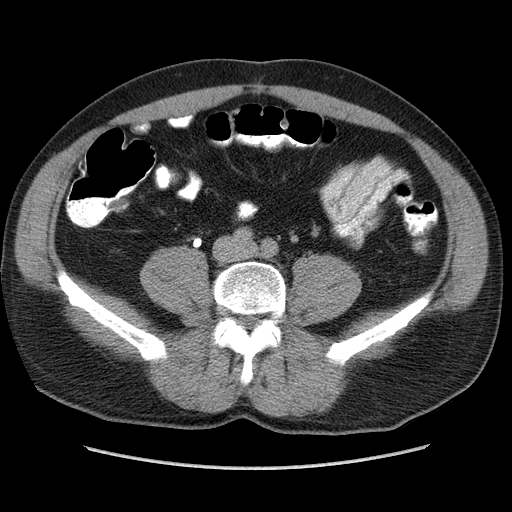
[im 3/42  bone]
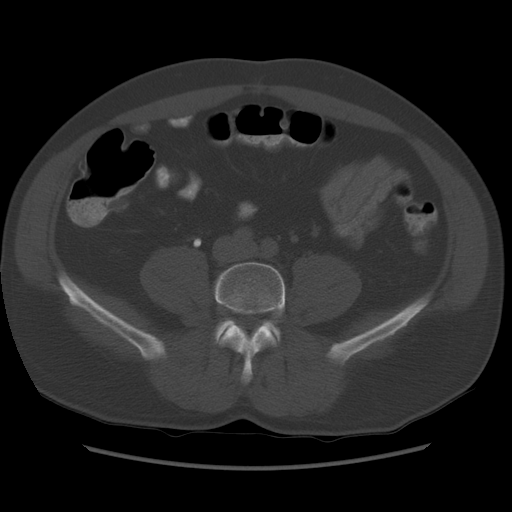
[im 6/42  soft-tissue]
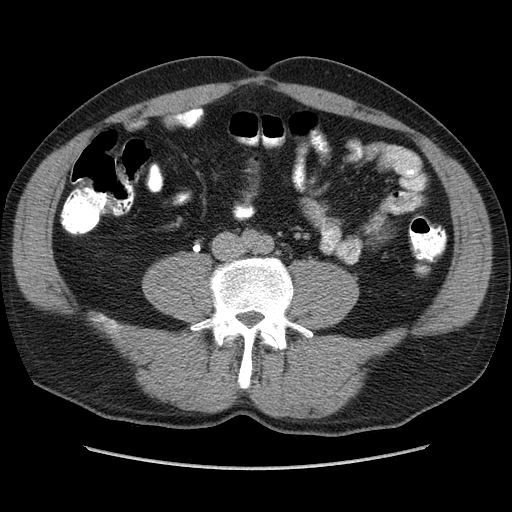
[im 10/42  soft-tissue]
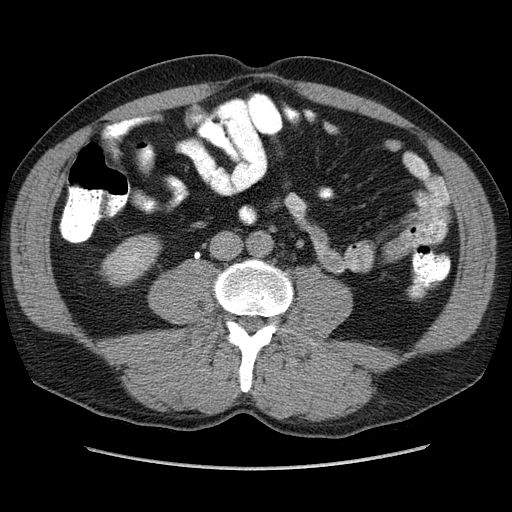
[im 12/42  soft-tissue]
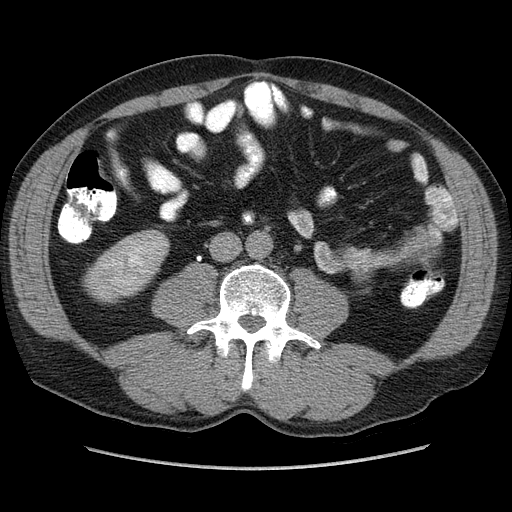
[im 16/42  soft-tissue]
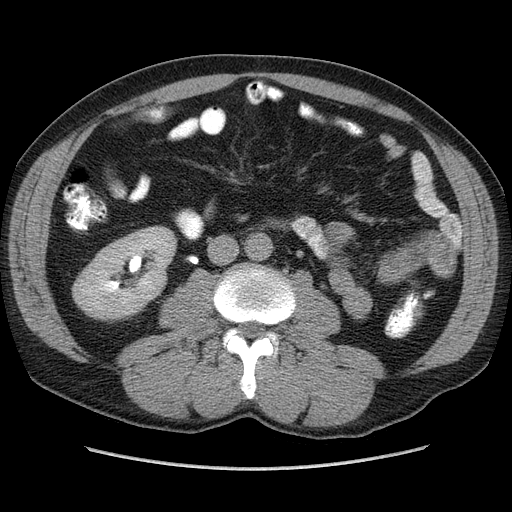
[im 19/42  soft-tissue]
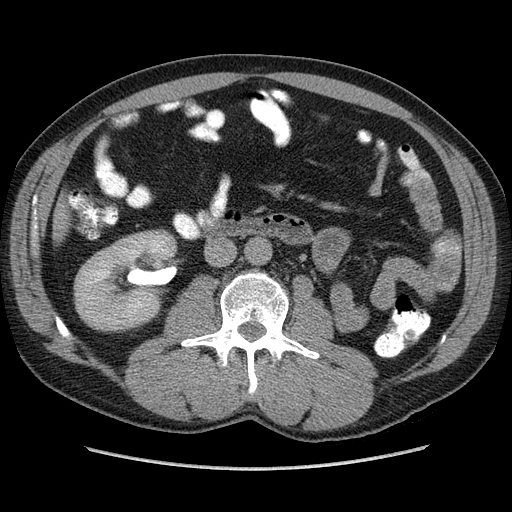
[im 23/42  soft-tissue]
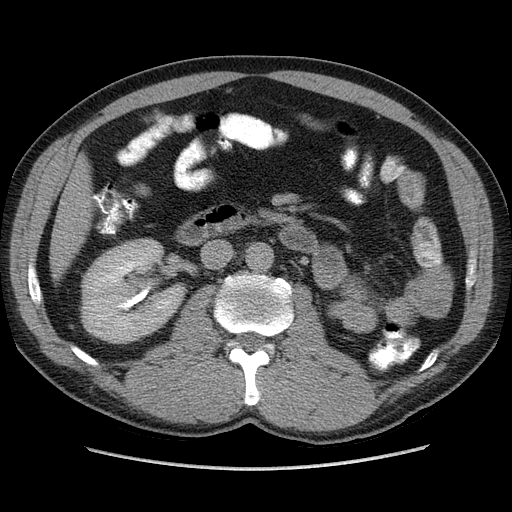
[im 26/42  soft-tissue]
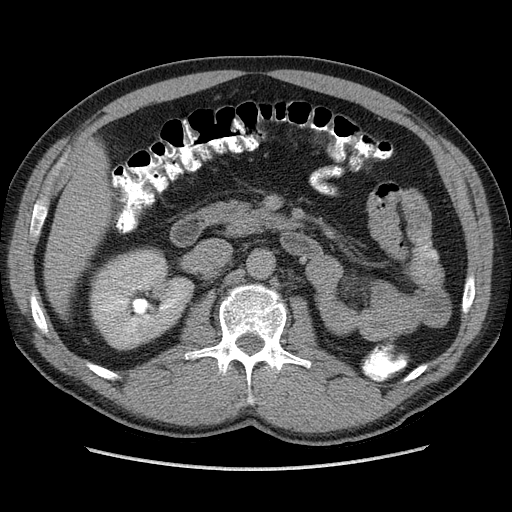
[im 30/42  soft-tissue]
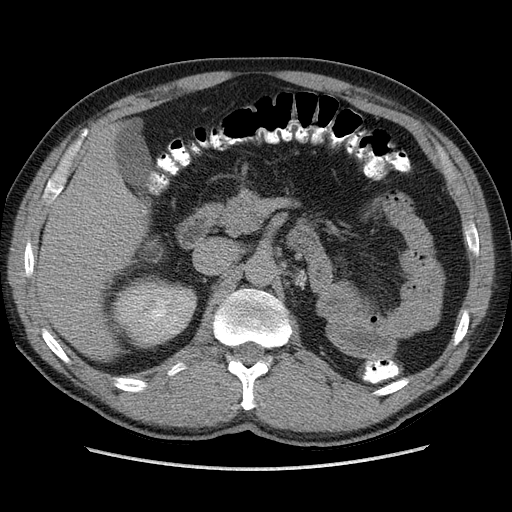
[im 30/42  bone]
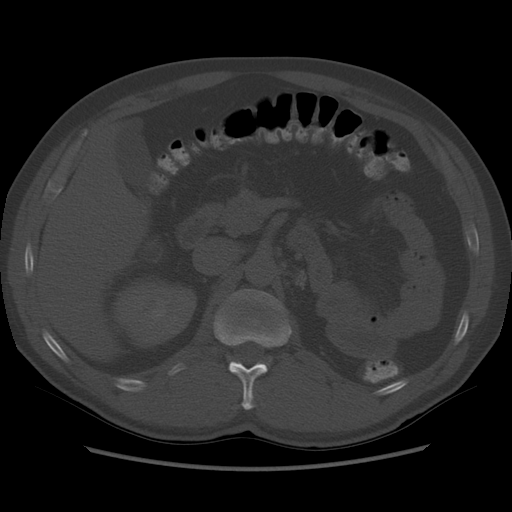
[im 32/42  soft-tissue]
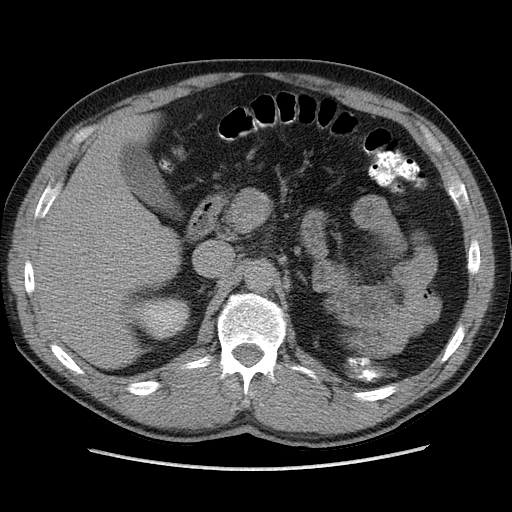
[im 36/42  soft-tissue]
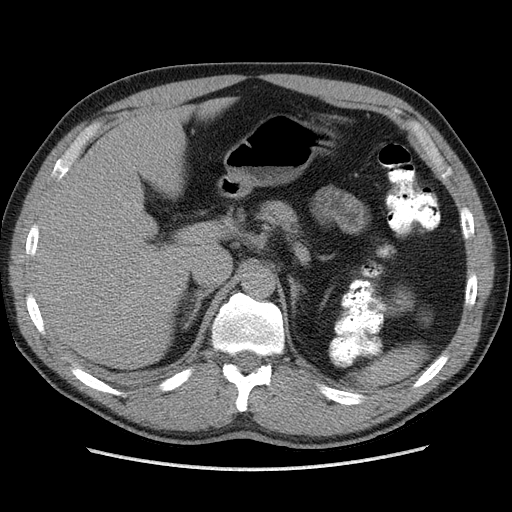
[im 36/42  lung]
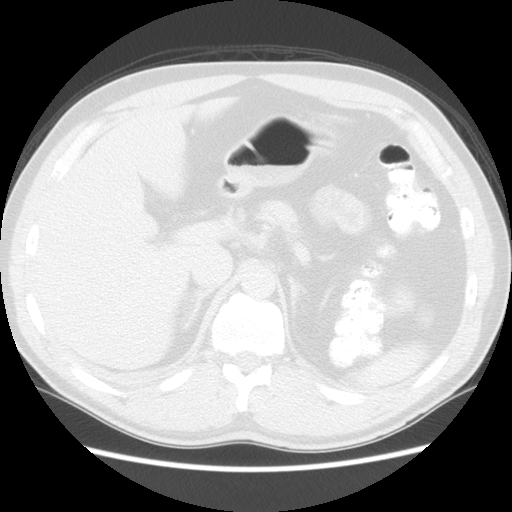
[im 38/42  lung]
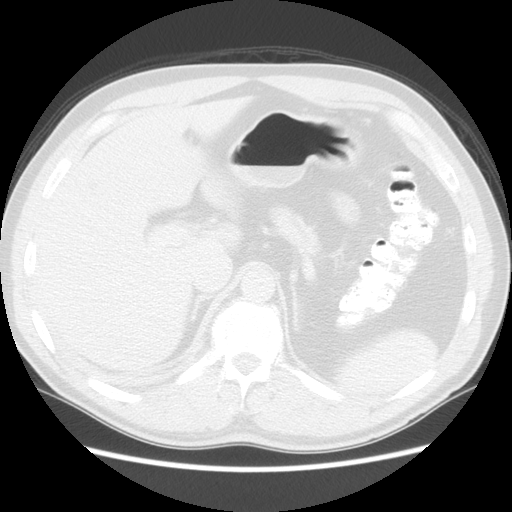
[im 39/42  soft-tissue]
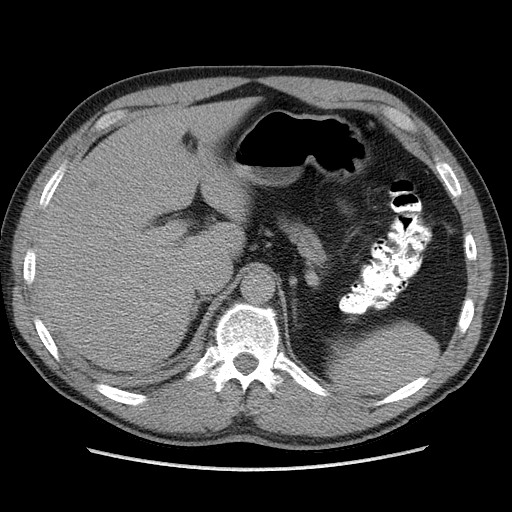
[im 39/42  lung]
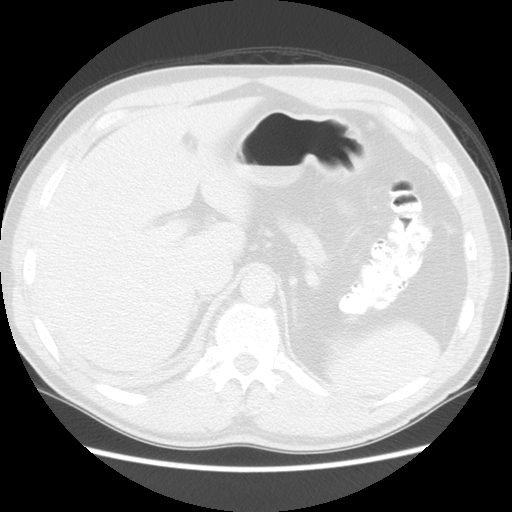
[im 40/42  lung]
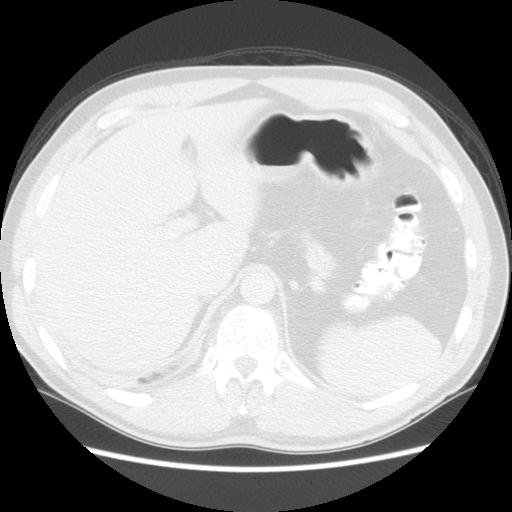

[14 of 32 positions shown; findings below may reference images not displayed]

FINDINGS: Since 03/07/2011, stable 6 mm right lower lobe nodule
(axial image 39) and post radiotherapy/ surgical scarring right
lung

Unchanged slight centrilobular emphysema - primarily upper lobes.

Interval slight dependent edema lower lobes.

Lungs otherwise clear.

No new mediastinal, hilar, axillary, supraclavicular
mass/adenopathy seen.

Heart size normal.

No new osseous metastatic lesions visualized.
IMPRESSION: 1.  Since 03/07/2011, stable 6 mm right lower lobe nodule and
postsurgical/radiation changes right lung.
2.  Stable slight upper lobe centrilobular emphysema with interval
slight lower lobe dependent edema.
3.  Otherwise, no interval acute or metastatic disease.

CT ABDOMEN AND PELVIS
FINDINGS: Left nephrectomy bed appears normal with no locally
recurrent renal cell cancer findings.

Stable 5 mm low density probable cyst since 12/18/2008 right lobe
liver with slight diffuse fatty infiltration liver and no new
metastatic lesions visualized.

Since 03/07/2011, stable to minimally enlarged circumscribed
enhancing pancreatic body lesion currently measuring 16 mm long
(coronal image 48) X 14 mm AP X 19 mm wide (03/07/2011; 12 mm X 12
mm X 17 mm).

Pancreatic ductal dilatation proximally and distally measures
approximately 4 - 5 mm without evidence of acute pancreatitis.

Remaining abdominal and pelvic organs appear normal without
interval inflammation, free fluid or adenopathy.

No interval osseous metastatic lesions visualized.
IMPRESSION: 1.  Stable left nephrectomy.
2.  Since 03/07/2011, stable to minimally enlarged circumscribed
enhancing pancreatic body lesion measuring up to 19 mm (previously
17 mm).  Differential diagnosis includes metastatic renal cell
carcinoma, primary pancreatic islet cell tumor.  Abdominal MRI
pancreatic protocol pre post contrast or endoscopic ultrasound may
be contributory as clinically indicated.  Nuclear medicine PET-CT
limited for assessment possible primary pancreatic islet cell
tumor).
3.  Stable right hepatic 5 mm low density - probable cyst, slight
diffuse fatty infiltration liver.
4.  No additional acute or metastatic disease findings.

## 2013-06-17 ENCOUNTER — Encounter (HOSPITAL_COMMUNITY): Payer: Self-pay

## 2013-06-17 ENCOUNTER — Ambulatory Visit (HOSPITAL_COMMUNITY)
Admission: RE | Admit: 2013-06-17 | Discharge: 2013-06-17 | Disposition: A | Discharge status: Home / Self Care | Payer: BC Managed Care – PPO | Source: Ambulatory Visit | Attending: Oncology | Admitting: Oncology

## 2013-06-17 DIAGNOSIS — IMO0002 Reserved for concepts with insufficient information to code with codable children: Secondary | ICD-10-CM | POA: Insufficient documentation

## 2013-06-17 DIAGNOSIS — Z905 Acquired absence of kidney: Secondary | ICD-10-CM | POA: Insufficient documentation

## 2013-06-17 DIAGNOSIS — C7951 Secondary malignant neoplasm of bone: Secondary | ICD-10-CM | POA: Insufficient documentation

## 2013-06-17 DIAGNOSIS — C7889 Secondary malignant neoplasm of other digestive organs: Secondary | ICD-10-CM | POA: Insufficient documentation

## 2013-06-17 DIAGNOSIS — C649 Malignant neoplasm of unspecified kidney, except renal pelvis: Secondary | ICD-10-CM

## 2013-06-17 MED ORDER — IOHEXOL 300 MG/ML  SOLN
100.0000 mL | Freq: Once | INTRAMUSCULAR | Status: AC | PRN
  Administered 2013-06-17: 100 mL via INTRAVENOUS

## 2013-06-22 ENCOUNTER — Other Ambulatory Visit: Payer: Self-pay | Admitting: *Deleted

## 2013-06-22 MED ORDER — PAZOPANIB HCL 200 MG PO TABS: 800.0000 mg | ORAL_TABLET | Freq: Every morning | ORAL | Status: DC

## 2013-06-22 NOTE — Addendum Note (Signed)
Addended by: Tania Ade on: 06/22/2013 01:52 PM   Modules accepted: Orders

## 2013-06-22 NOTE — Telephone Encounter (Signed)
Refill request for Votrient to MD for approval.

## 2013-06-28 ENCOUNTER — Telehealth: Payer: Self-pay | Admitting: *Deleted

## 2013-06-28 NOTE — Telephone Encounter (Signed)
Patient calling to c/o pain in his groin and right hip, that goes down his leg. Per [name], it is not likely to be cancer related. He may take tylenol or advil for discomfort. Patient verbalizes understanding.

## 2013-06-28 NOTE — Telephone Encounter (Signed)
RECEIVED A FAX FROM [employer] CONCERNING A CONFIRMATION OF PRESCRIPTION SHIPMENT FOR VOTRIENT ON 06/27/13.

## 2013-07-01 ENCOUNTER — Other Ambulatory Visit (HOSPITAL_BASED_OUTPATIENT_CLINIC_OR_DEPARTMENT_OTHER): Payer: BC Managed Care – PPO | Admitting: Lab

## 2013-07-01 ENCOUNTER — Telehealth: Payer: Self-pay | Admitting: Oncology

## 2013-07-01 ENCOUNTER — Ambulatory Visit (HOSPITAL_BASED_OUTPATIENT_CLINIC_OR_DEPARTMENT_OTHER): Payer: BC Managed Care – PPO | Admitting: Oncology

## 2013-07-01 VITALS — BP 142/100 | HR 75 | Temp 98.5°F | Resp 20 | Ht 75.0 in | Wt 212.3 lb

## 2013-07-01 DIAGNOSIS — C649 Malignant neoplasm of unspecified kidney, except renal pelvis: Secondary | ICD-10-CM

## 2013-07-01 LAB — CBC WITH DIFFERENTIAL/PLATELET
BASO%: 0.6 % (ref 0.0–2.0)
Basophils Absolute: 0 10*3/uL (ref 0.0–0.1)
EOS%: 3.2 % (ref 0.0–7.0)
Eosinophils Absolute: 0.2 10*3/uL (ref 0.0–0.5)
HCT: 44.9 % (ref 38.4–49.9)
HGB: 15.7 g/dL (ref 13.0–17.1)
LYMPH%: 23.3 % (ref 14.0–49.0)
MCH: 37 pg — ABNORMAL HIGH (ref 27.2–33.4)
MCHC: 34.9 g/dL (ref 32.0–36.0)
MCV: 106.1 fL — ABNORMAL HIGH (ref 79.3–98.0)
MONO#: 0.4 10*3/uL (ref 0.1–0.9)
MONO%: 6.4 % (ref 0.0–14.0)
NEUT#: 4.2 10*3/uL (ref 1.5–6.5)
NEUT%: 66.5 % (ref 39.0–75.0)
Platelets: 151 10*3/uL (ref 140–400)
RBC: 4.23 10*6/uL (ref 4.20–5.82)
RDW: 13.8 % (ref 11.0–14.6)
WBC: 6.3 10*3/uL (ref 4.0–10.3)
lymph#: 1.5 10*3/uL (ref 0.9–3.3)

## 2013-07-01 LAB — COMPREHENSIVE METABOLIC PANEL (CC13)
ALT: 56 U/L — ABNORMAL HIGH (ref 0–55)
AST: 37 U/L — ABNORMAL HIGH (ref 5–34)
Albumin: 3.6 g/dL (ref 3.5–5.0)
Alkaline Phosphatase: 89 U/L (ref 40–150)
BUN: 10.5 mg/dL (ref 7.0–26.0)
CO2: 26 mEq/L (ref 22–29)
Calcium: 9.6 mg/dL (ref 8.4–10.4)
Chloride: 103 mEq/L (ref 98–109)
Creatinine: 1.2 mg/dL (ref 0.7–1.3)
Glucose: 141 mg/dl — ABNORMAL HIGH (ref 70–140)
Potassium: 4.7 mEq/L (ref 3.5–5.1)
Sodium: 139 mEq/L (ref 136–145)
Total Bilirubin: 0.47 mg/dL (ref 0.20–1.20)
Total Protein: 7.5 g/dL (ref 6.4–8.3)

## 2013-07-01 NOTE — Telephone Encounter (Signed)
Wife called for next appt. Schedulers were in a meeitng when pt left. Wife given next appt foro 8/15 @ 8:15am lb/KC.

## 2013-07-01 NOTE — Progress Notes (Signed)
Hematology and Oncology Follow Up Visit  [name] US:[id number] 1966/01/01 47 y.o. 07/01/2013 8:36 AM  Keith Bring, MD  Keith [name], M.D.  Keith Bent, MD  [name], MD    Principle Diagnosis: This is a 47 year old gentleman with stage IV renal cell carcinoma diagnosed in 2009.  Prior Therapy: 1. Status post laparoscopic radical nephrectomy.  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 2. Patient status post thoracotomy done October 2009.  He had a lower lobe nodule, biopsy proven to be metastatic renal cell carcinoma.   3. Patient is status post stereotactic radiotherapy to pulmonary nodules in May of 2010. 4. He is S/P Sutent 50 mg 4 weeks on 2 weeks off from November of 2011 to 03/2013. He progressed at that time.  5. He is S/P radiation to the right sacral bone between 4/22 to 4/30.    Current therapy: He is on Votrient 800 mg since 03/2013. His disease include pancreatic involvement with RCC as well as right  sacral lesion.   Interim History:  Mr. Keith [name] presents today for a followup visit.  He has tolerated Votrient very well without any complications this month. He did report grade 1 hand-foot syndrome. His pain in the his feet as well as the ankles. Has not reported any other complications. He is no longer taking Cymbalta. He is not reporting any neurological symptoms. He reports  Right groin pain but still able to ambulate without difficulty. No neurological symptoms. Energy and performance status is unchanged.   Medications: I have reviewed the patient's current medications.  Current Outpatient Prescriptions  Medication Sig Dispense Refill  . ALPRAZolam (XANAX) 1 MG tablet Take 1 tablet (1 mg total) by mouth every 8 (eight) hours as needed. Anxiety  30 tablet  1  . DULoxetine (CYMBALTA) 60 MG capsule Take 60 mg by mouth every morning.       . magnesium hydroxide (MILK OF MAGNESIA) 400 MG/5ML suspension Take 15 mLs by mouth daily as needed for  constipation.  360 mL  0  . oxyCODONE (OXY IR/ROXICODONE) 5 MG immediate release tablet Take 1-4 tablets (5-20 mg total) by mouth every 4 (four) hours as needed for pain.  120 tablet  0  . pazopanib (VOTRIENT) 200 MG tablet Take 4 tablets (800 mg total) by mouth every morning. Take on an empty stomach.  120 tablet  0   No current facility-administered medications for this visit.     Allergies:  Allergies  Allergen Reactions  . Ceftriaxone Hives  . Hydrocodone Swelling    Past Medical History, Surgical history, Social history, and Family History were reviewed and updated.  Review of Systems: Constitutional:  Negative for fever, chills, night sweats, anorexia, weight loss, pain. Cardiovascular: no chest pain or dyspnea on exertion Respiratory: no cough, shortness of breath, or wheezing Neurological: no TIA or stroke symptoms Dermatological: negative ENT: negative Skin: Negative. Gastrointestinal: no abdominal pain, change in bowel habits, or black or bloody stools Genito-Urinary: no dysuria, trouble voiding, or hematuria Hematological and Lymphatic: negative Breast: negative Musculoskeletal: negative for - joint swelling, muscle pain or pain in foot - bilateral Remaining ROS negative.  Physical Exam: Blood pressure 142/100, pulse 75, temperature 98.5 F (36.9 C), temperature source Oral, resp. rate 20, height 6\' 3"  (1.905 m), weight 212 lb 4.8 oz (96.299 kg). ECOG: 1 General appearance: alert Head: Normocephalic, without obvious abnormality, atraumatic Neck: no adenopathy, no carotid bruit, no JVD, supple, symmetrical, trachea midline and thyroid not enlarged,  symmetric, no tenderness/mass/nodules Lymph nodes: Cervical, supraclavicular, and axillary nodes normal. Heart:regular rate and rhythm, S1, S2 normal, no murmur, click, rub or gallop Lung:chest clear, no wheezing, rales, normal symmetric air entry. Chest wall pain is reproducible with pressure. Abdomen: soft, non-tender,  without masses or organomegaly EXT:no edema, no despumation. No erythema noted. Tender to touch at the ankle and shin area on both sides.   Neuro: no focal deficits noted. No weakness noted in his LE  Lab Results: Lab Results  Component Value Date   WBC 6.3 07/01/2013   HGB 15.7 07/01/2013   HCT 44.9 07/01/2013   MCV 106.1* 07/01/2013   PLT 151 07/01/2013   CT CHEST, ABDOMEN AND PELVIS WITH CONTRAST  Technique: Multidetector CT imaging of the chest, abdomen and  pelvis was performed following the standard protocol during bolus  administration of intravenous contrast.  Contrast: 117mL OMNIPAQUE IOHEXOL 300 MG/ML SOLN  Comparison: 03/22/2013  CT CHEST  Findings:  There is no pleural effusion identified. There are postoperative  changes and volume loss involving the right lung. Changes of  external beam radiation are identified within the right lung.  Scarring and architectural distortion is noted in the right lung  base and right midlung. Right apical scar is identified, image  18/series 7 and appears similar to previous study. Similar  appearance of the soft tissue around the proximal aspects of the  medial and lateral segment bronchi to the right middle lobe. This  measures 2.2 x 2.1 cm, image 34/series 7. This is compared with  1.7 x 2.1 cm previously. Stable right midlung nodule measuring 4  mm, image 27/series 7.  Mild cardiac enlargement. No pericardial effusion. No enlarged or  enlarging mediastinal or hilar lymph nodes identified.  There is no axillary or supraclavicular adenopathy. Review of the  visualized osseous structures shows multilevel degenerative disc  disease. There are no aggressive lytic or sclerotic bone lesions.  IMPRESSION:  1. No significant change compared with previous exam.  2. Stable soft tissue between the medial and lateral segmental  bronchi to the right middle lobe.  CT ABDOMEN AND PELVIS  Findings: No suspicious liver abnormalities identified.   Gallbladder is normal. No biliary dilatation. Multiple  hypervascular lesions are noted within the pancreas. Index lesion  within the head of pancreas measures 1.4 cm, image 45/series 2.  Previously 1.3 cm. Within the body of pancreas there is a 3 cm  lesion, image 35/series 2. This is compared with 2.9 cm  previously. Neck of pancreas lesion measures 1.1 cm, image  47/series 2. Previously this measured the same. Normal appearance  of the spleen.  The adrenal glands are normal. Status post left nephrectomy.  Enhancing lesion within the inferior pole of the right kidney  measures 1.1 cm, image 78/series 2. This is compared with 1.2 cm  previously. Urinary bladder appears within normal limits.  Normal caliber of the abdominal aorta. There is no aneurysm. No  upper abdominal adenopathy noted. There is no pelvic or inguinal  adenopathy noted. No free fluid or fluid collections identified.  The stomach is normal. The small bowel loops are unremarkable.  Normal appearance of the appendix. The colon is unremarkable.  No free fluid or abnormal fluid collections within the abdomen or  pelvis.  Review of the visualized bony structures shows a lytic lesion  within the right sacral wing measuring 3.2 cm, image 178/series 6.  This is compared with 2.6 cm previously.  IMPRESSION:  1. No significant change in the  appearance of hypervascular  pancreas metastasis.  2. Increase in size of right sacral wing lytic metastasis.    Impression and Plan:  This is a pleasant 47 year old gentleman with the following issues. 1. Metastatic renal cell carcinoma.  He has documented disease to the lung and the pancreas.  He tolerating Votrient without complications. CT scan from 6/27 reviewed and did not show any new lesions. The plan is to continue the current medication without any change.  2. Pancreatic thickening/mass.  This has been biopsy proven to be a renal cell metastasis. No change in treatment at this  time. 3. Depression/Anxiety: More irritable.Mild pain which has improved now and wants to go back to work.  4. Constipation: improved.  5. Follow up: 4 weeks.  [id number] 7/11/20148:36 AM

## 2013-07-22 ENCOUNTER — Other Ambulatory Visit: Payer: Self-pay

## 2013-07-22 ENCOUNTER — Other Ambulatory Visit: Payer: Self-pay | Admitting: *Deleted

## 2013-07-22 MED ORDER — PAZOPANIB HCL 200 MG PO TABS: 800.0000 mg | ORAL_TABLET | Freq: Every morning | ORAL | Status: DC

## 2013-07-22 NOTE — Telephone Encounter (Signed)
THIS REFILL REQUEST FOR VOTRIENT WAS PLACED IN DR.SHADAD'S ACTIVE WORK FOLDER. 

## 2013-07-26 ENCOUNTER — Other Ambulatory Visit: Payer: Self-pay | Admitting: *Deleted

## 2013-07-26 DIAGNOSIS — C649 Malignant neoplasm of unspecified kidney, except renal pelvis: Secondary | ICD-10-CM

## 2013-07-26 MED ORDER — PAZOPANIB HCL 200 MG PO TABS: 800.0000 mg | ORAL_TABLET | Freq: Every morning | ORAL | Status: DC

## 2013-08-05 ENCOUNTER — Telehealth: Payer: Self-pay | Admitting: Oncology

## 2013-08-05 ENCOUNTER — Encounter: Payer: Self-pay | Admitting: Oncology

## 2013-08-05 ENCOUNTER — Ambulatory Visit (HOSPITAL_BASED_OUTPATIENT_CLINIC_OR_DEPARTMENT_OTHER): Payer: BC Managed Care – PPO | Admitting: Oncology

## 2013-08-05 ENCOUNTER — Other Ambulatory Visit (HOSPITAL_BASED_OUTPATIENT_CLINIC_OR_DEPARTMENT_OTHER): Payer: BC Managed Care – PPO

## 2013-08-05 VITALS — BP 155/101 | HR 58 | Temp 97.4°F | Resp 20 | Ht 75.0 in | Wt 214.1 lb

## 2013-08-05 DIAGNOSIS — C649 Malignant neoplasm of unspecified kidney, except renal pelvis: Secondary | ICD-10-CM

## 2013-08-05 DIAGNOSIS — C7970 Secondary malignant neoplasm of unspecified adrenal gland: Secondary | ICD-10-CM

## 2013-08-05 DIAGNOSIS — C7800 Secondary malignant neoplasm of unspecified lung: Secondary | ICD-10-CM

## 2013-08-05 LAB — CBC WITH DIFFERENTIAL/PLATELET
BASO%: 0.3 % (ref 0.0–2.0)
Basophils Absolute: 0 10*3/uL (ref 0.0–0.1)
EOS%: 2.8 % (ref 0.0–7.0)
Eosinophils Absolute: 0.2 10*3/uL (ref 0.0–0.5)
HCT: 44.1 % (ref 38.4–49.9)
HGB: 14.5 g/dL (ref 13.0–17.1)
LYMPH%: 25.6 % (ref 14.0–49.0)
MCH: 35 pg — ABNORMAL HIGH (ref 27.2–33.4)
MCHC: 32.9 g/dL (ref 32.0–36.0)
MCV: 106.5 fL — ABNORMAL HIGH (ref 79.3–98.0)
MONO#: 0.4 10*3/uL (ref 0.1–0.9)
MONO%: 6.4 % (ref 0.0–14.0)
NEUT#: 4.4 10*3/uL (ref 1.5–6.5)
NEUT%: 64.9 % (ref 39.0–75.0)
Platelets: 156 10*3/uL (ref 140–400)
RBC: 4.14 10*6/uL — ABNORMAL LOW (ref 4.20–5.82)
RDW: 13.6 % (ref 11.0–14.6)
WBC: 6.8 10*3/uL (ref 4.0–10.3)
lymph#: 1.8 10*3/uL (ref 0.9–3.3)

## 2013-08-05 LAB — COMPREHENSIVE METABOLIC PANEL (CC13)
ALT: 48 U/L (ref 0–55)
AST: 29 U/L (ref 5–34)
Albumin: 3.5 g/dL (ref 3.5–5.0)
Alkaline Phosphatase: 90 U/L (ref 40–150)
BUN: 13.3 mg/dL (ref 7.0–26.0)
CO2: 25 mEq/L (ref 22–29)
Calcium: 9.3 mg/dL (ref 8.4–10.4)
Chloride: 107 mEq/L (ref 98–109)
Creatinine: 1.3 mg/dL (ref 0.7–1.3)
Glucose: 156 mg/dl — ABNORMAL HIGH (ref 70–140)
Potassium: 4.6 mEq/L (ref 3.5–5.1)
Sodium: 140 mEq/L (ref 136–145)
Total Bilirubin: 0.34 mg/dL (ref 0.20–1.20)
Total Protein: 7.6 g/dL (ref 6.4–8.3)

## 2013-08-05 MED ORDER — ALPRAZOLAM 1 MG PO TABS: 1.0000 mg | ORAL_TABLET | Freq: Three times a day (TID) | ORAL | Status: DC | PRN

## 2013-08-05 MED ORDER — TRAMADOL HCL 50 MG PO TABS: 50.0000 mg | ORAL_TABLET | Freq: Four times a day (QID) | ORAL | Status: DC | PRN

## 2013-08-05 NOTE — Telephone Encounter (Signed)
Gave pt appt for lab and MD on September, date ok per ML

## 2013-08-05 NOTE — Progress Notes (Signed)
Hematology and Oncology Follow Up Visit  Keith [name] US:[id number] February 22, 1966 47 y.o. 08/05/2013 3:12 PM  Keith Bring, MD  Keith [name], M.D.  Keith Bent, MD  Keith Banister, MD    Principle Diagnosis: This is a 47 year old gentleman with stage IV renal cell carcinoma diagnosed in 2009.  Prior Therapy: 1. Status post laparoscopic radical nephrectomy.  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 2. Patient status post thoracotomy done October 2009.  He had a lower lobe nodule, biopsy proven to be metastatic renal cell carcinoma.   3. Patient is status post stereotactic radiotherapy to pulmonary nodules in May of 2010. 4. He is S/P Sutent 50 mg 4 weeks on 2 weeks off from November of 2011 to 03/2013. He progressed at that time.  5. He is S/P radiation to the right sacral bone between 4/22 to 4/30.    Current therapy: He is on Votrient 800 mg since 03/2013. His disease include pancreatic involvement with RCC as well as right  sacral lesion.   Interim History:  Keith [name] presents today for a followup visit.  He has tolerated Votrient very well without any complications this month. He did report grade 1 hand-foot syndrome. His pain in the his feet as well as the ankles. Reports pain to his chest radiating around to his back. States that he can no longer work up due to the pain. He is work for about 2 weeks. States that he was given Percocet by his PCP for his pain. He continue to take Cymbalta as well. Has not reported any other complications. He is not reporting any neurological symptoms. Reports that he is weaker over all. However, performance status is essentially unchanged.  Medications: I have reviewed the patient's current medications.  Current Outpatient Prescriptions  Medication Sig Dispense Refill  . ALPRAZolam (XANAX) 1 MG tablet Take 1 tablet (1 mg total) by mouth every 8 (eight) hours as needed. Anxiety  50 tablet  1  . DULoxetine (CYMBALTA) 60 MG capsule  Take 60 mg by mouth every morning.       . magnesium hydroxide (MILK OF MAGNESIA) 400 MG/5ML suspension Take 15 mLs by mouth daily as needed for constipation.  360 mL  0  . oxyCODONE (OXY IR/ROXICODONE) 5 MG immediate release tablet Take 1-4 tablets (5-20 mg total) by mouth every 4 (four) hours as needed for pain.  120 tablet  0  . pazopanib (VOTRIENT) 200 MG tablet Take 4 tablets (800 mg total) by mouth every morning. Take on an empty stomach.  120 tablet  0  . traMADol (ULTRAM) 50 MG tablet Take 1 tablet (50 mg total) by mouth every 6 (six) hours as needed for pain.  60 tablet  1   No current facility-administered medications for this visit.     Allergies:  Allergies  Allergen Reactions  . Ceftriaxone Hives  . Hydrocodone Swelling    Past Medical History, Surgical history, Social history, and Family History were reviewed and updated.  Review of Systems: Constitutional:  Negative for fever, chills, night sweats, anorexia, weight loss, pain. Cardiovascular: no chest pain or dyspnea on exertion Respiratory: no cough, shortness of breath, or wheezing Neurological: no TIA or stroke symptoms Dermatological: negative ENT: negative Skin: Negative. Gastrointestinal: no abdominal pain, change in bowel habits, or black or bloody stools Genito-Urinary: no dysuria, trouble voiding, or hematuria Hematological and Lymphatic: negative Breast: negative Musculoskeletal: negative for - joint swelling, muscle pain or pain in foot - bilateral Remaining  ROS negative.  Physical Exam: Blood pressure 155/101, pulse 58, temperature 97.4 F (36.3 C), temperature source Oral, resp. rate 20, height 6\' 3"  (1.905 m), weight 214 lb 1.6 oz (97.115 kg). ECOG: 1 General appearance: alert Head: Normocephalic, without obvious abnormality, atraumatic Neck: no adenopathy, no carotid bruit, no JVD, supple, symmetrical, trachea midline and thyroid not enlarged, symmetric, no tenderness/mass/nodules Lymph nodes:  Cervical, supraclavicular, and axillary nodes normal. Heart:regular rate and rhythm, S1, S2 normal, no murmur, click, rub or gallop Lung:chest clear, no wheezing, rales, normal symmetric air entry. Chest wall pain is reproducible with pressure. Abdomen: soft, non-tender, without masses or organomegaly EXT:no edema, no despumation. No erythema noted. Tender to touch at the ankle and shin area on both sides.   Neuro: no focal deficits noted. No weakness noted in his LE  Lab Results: Lab Results  Component Value Date   WBC 6.8 08/05/2013   HGB 14.5 08/05/2013   HCT 44.1 08/05/2013   MCV 106.5* 08/05/2013   PLT 156 08/05/2013    Impression and Plan:  This is a pleasant 47 year old gentleman with the following issues. 1. Metastatic renal cell carcinoma.  He has documented disease to the lung and the pancreas.  He tolerating Votrient without complications. CT scan from 6/27 did not show any new lesions. The plan is to continue the current medication without any change.  2. Pancreatic thickening/mass.  This has been biopsy proven to be a renal cell metastasis. No change in treatment at this time. 3. Depression/Anxiety: He remains on Cymbalta and Xanax.  4. Constipation: improved.  5. Pain: Most recent CT scans are stable. There is really no clear-cut cause for his pain. We have given him prescription for tramadol 50 mg every 6 hours as needed for pain. 6. Follow up: 4 weeks.  Keith [name] 8/15/20143:12 PM

## 2013-08-08 ENCOUNTER — Telehealth: Payer: Self-pay | Admitting: *Deleted

## 2013-08-08 NOTE — Telephone Encounter (Signed)
Wife calling to request a note to be out of work 07/25/13 thru 09/15/13. Note signed by kristin curcio np and left at front desk.

## 2013-08-12 IMAGING — CR DG ABDOMEN ACUTE W/ 1V CHEST
4 series · 4 of 4 positions shown · non-contrast
Comparison: Chest x-ray 04/16/2011 and abdominal CT scan
08/01/2011.

CLINICAL DATA: Rectal bleeding and abdominal pain.

ACUTE ABDOMEN SERIES (ABDOMEN 2 VIEW & CHEST 1 VIEW)

[w chest pa]
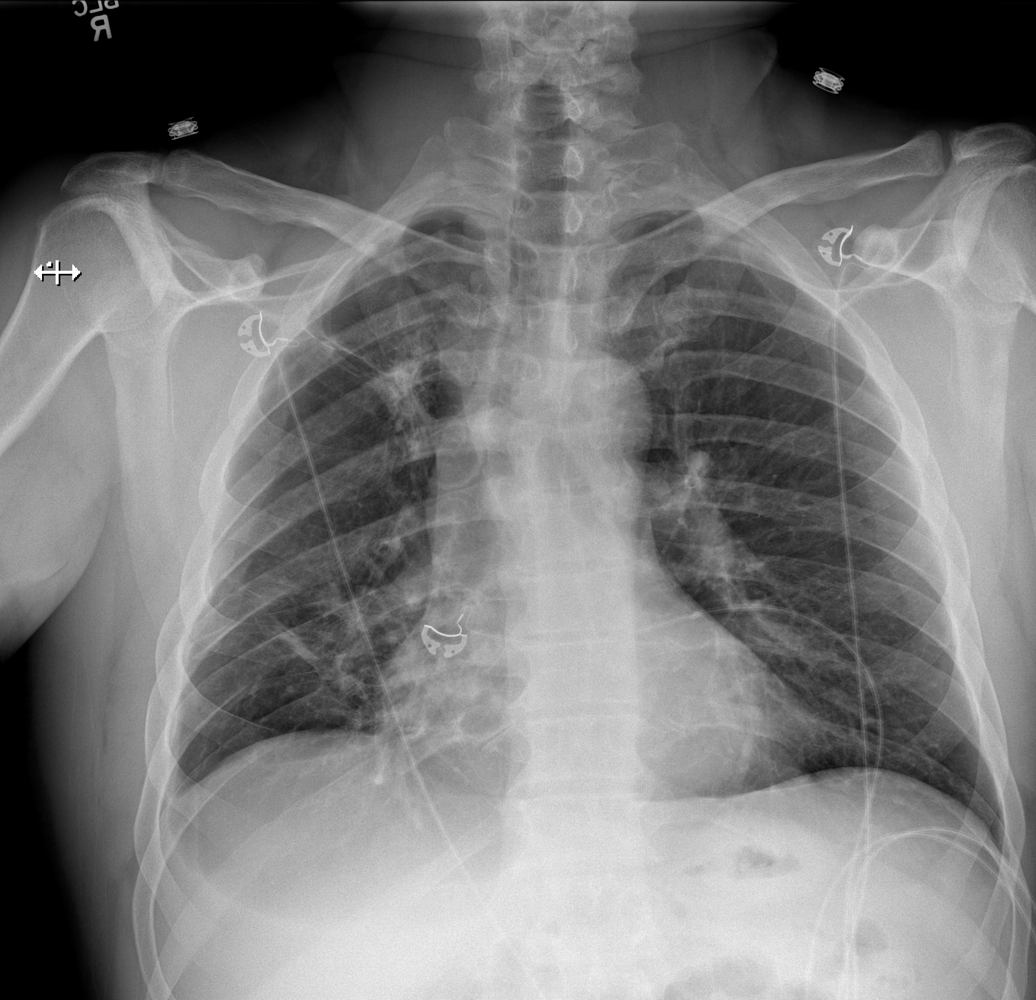

[w abdomen upright]
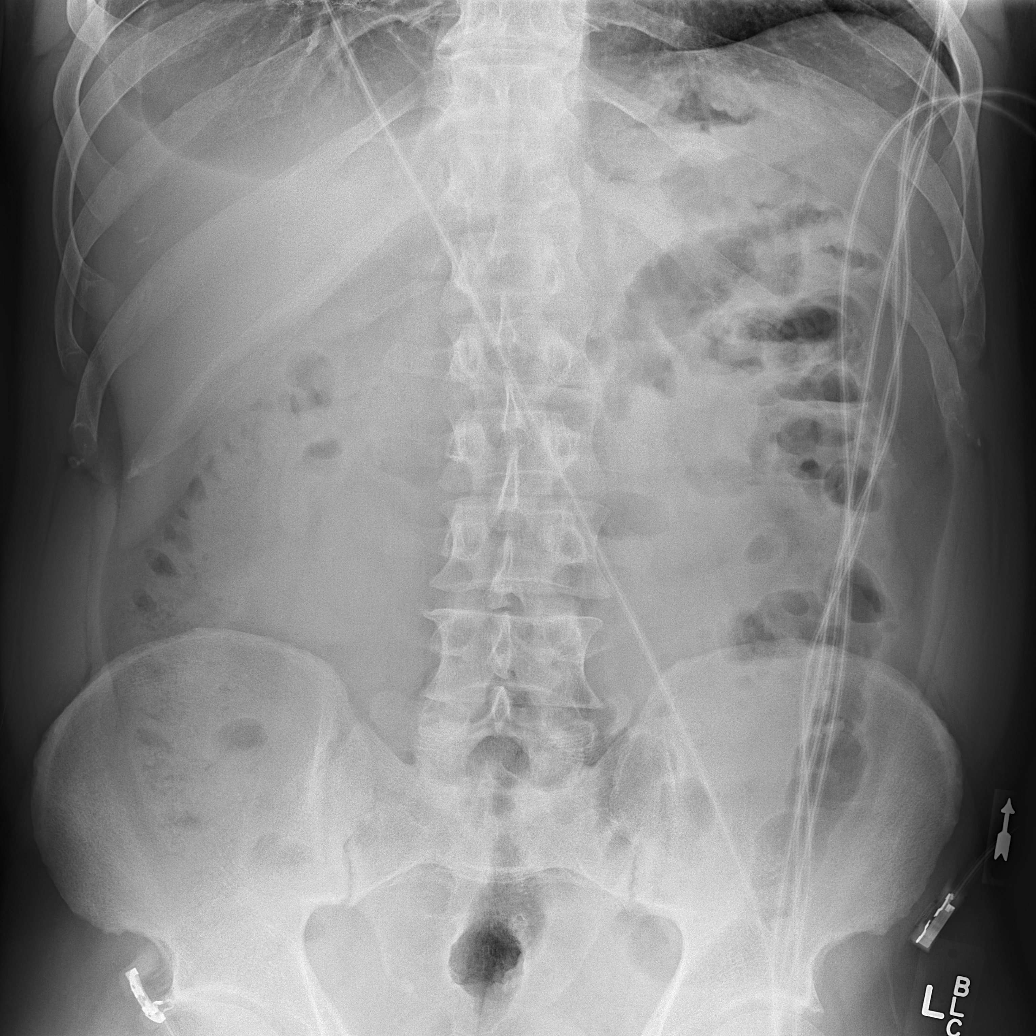

[t abdomen supine (1 of 2)]
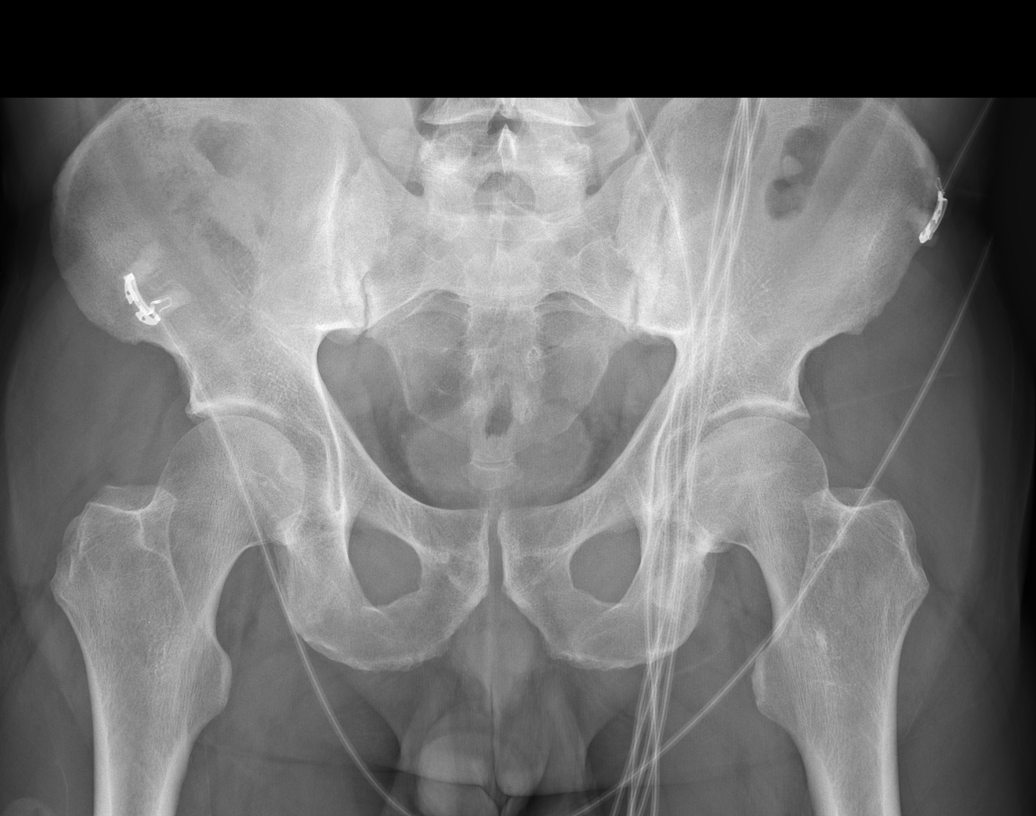

[t abdomen supine (2 of 2)]
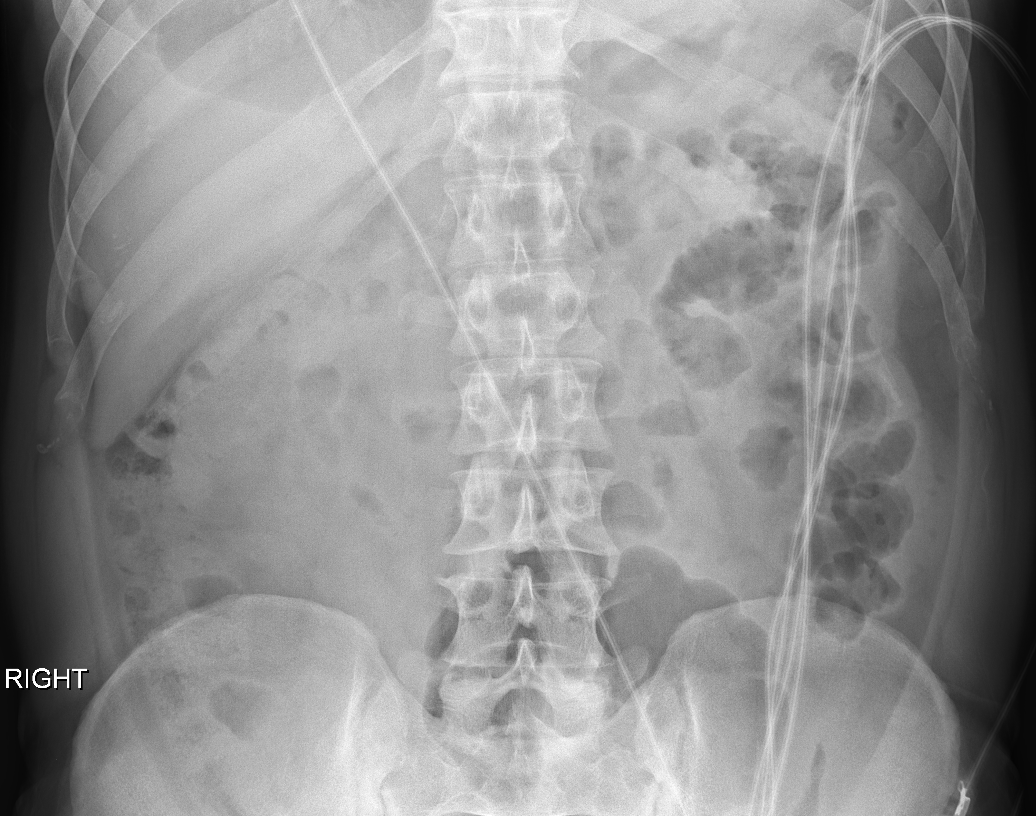

[4 of 4 positions shown; findings below may reference images not displayed]

FINDINGS: The upright chest x-ray demonstrates chronic scarring
changes.  No pleural effusion.

Two views of the abdomen demonstrate scattered air and stool in the
colon and scattered air in the small bowel with air-fluid levels.
Findings could be due to an ileus or early small bowel obstruction.
No free air.  The soft tissue shadows are maintained.  The bony
structures are intact.
IMPRESSION: Bowel gas pattern is nonspecific but could reflect an ileus or
early small bowel obstruction.  Recommend correlation with bowel
sounds and follow-up radiographs as indicated.

## 2013-08-12 IMAGING — CT CT ABD-PELV W/ CM
1 of 3 series · 14 of 32 positions shown, 19 images · IV contrast (omnipaque)
Comparison: 08/01/2011

CLINICAL DATA: Rectal bleeding, abdominal pain.

CT ABDOMEN AND PELVIS WITH CONTRAST
TECHNIQUE: Multidetector CT imaging of the abdomen and pelvis was
performed following the standard protocol during bolus
administration of intravenous contrast.
Contrast: 100mL OMNIPAQUE IOHEXOL 300 MG/ML IV SOLN

[Series 2: abd/pel with · axial · 0.91mm/px · z∈[-802,-352]mm · 14 of 101 slices shown, 19 images]
[im 6/101  soft-tissue]
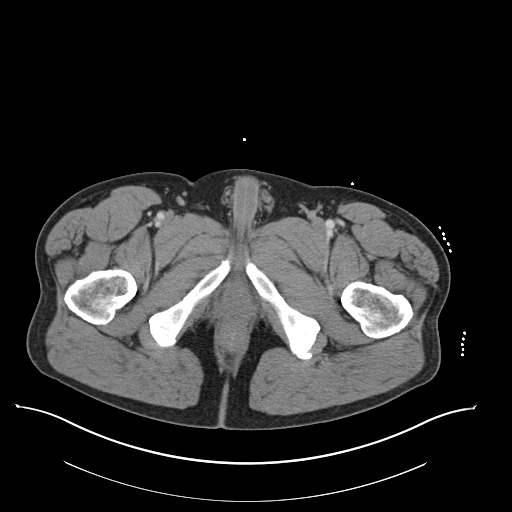
[im 6/101  bone]
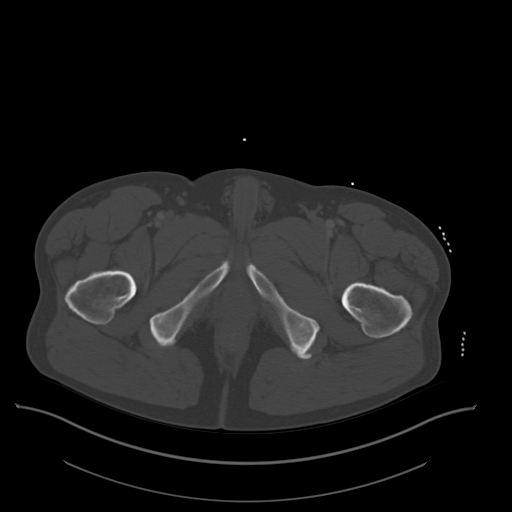
[im 16/101  soft-tissue]
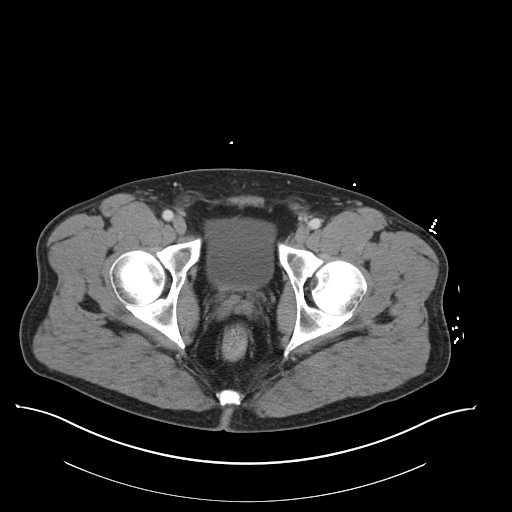
[im 21/101  soft-tissue]
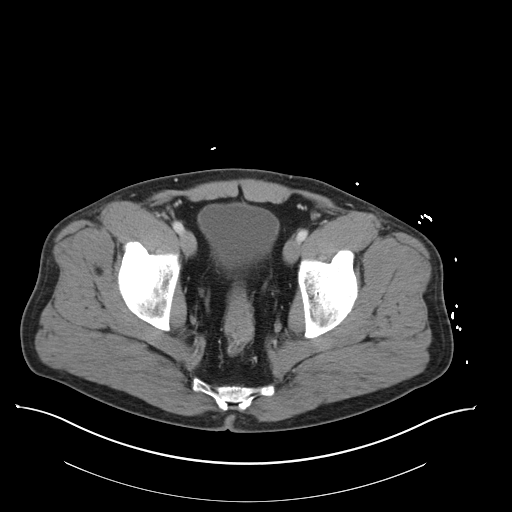
[im 31/101  soft-tissue]
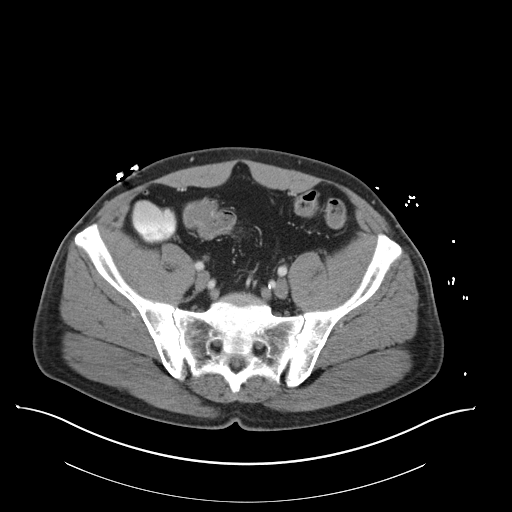
[im 36/101  soft-tissue]
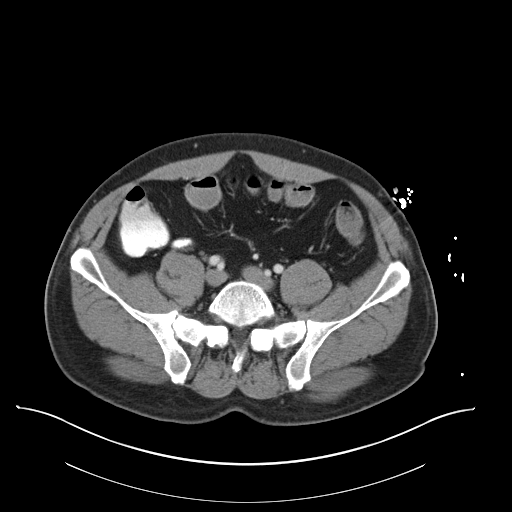
[im 46/101  soft-tissue]
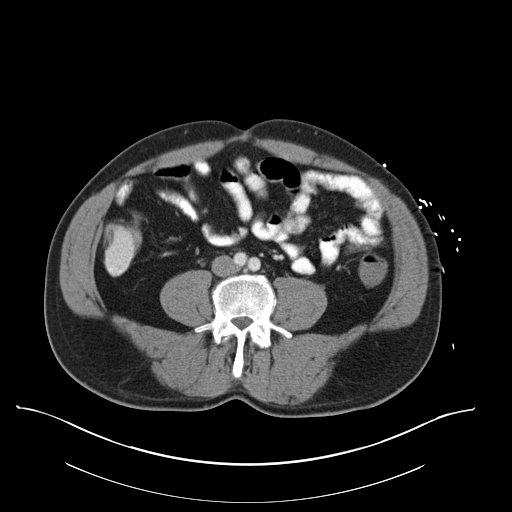
[im 51/101  soft-tissue]
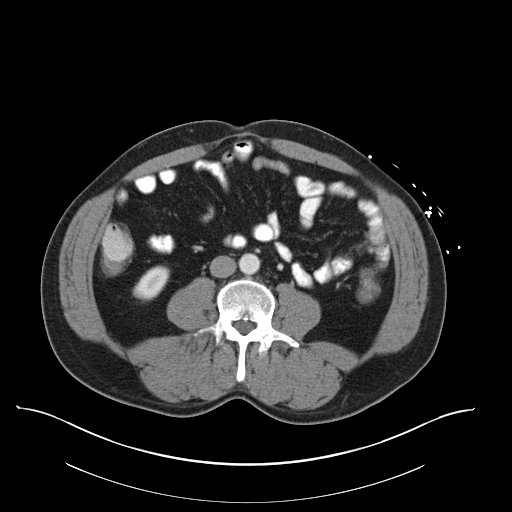
[im 56/101  soft-tissue]
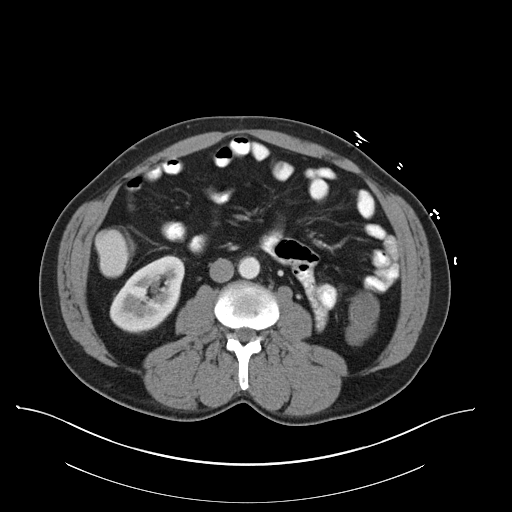
[im 66/101  soft-tissue]
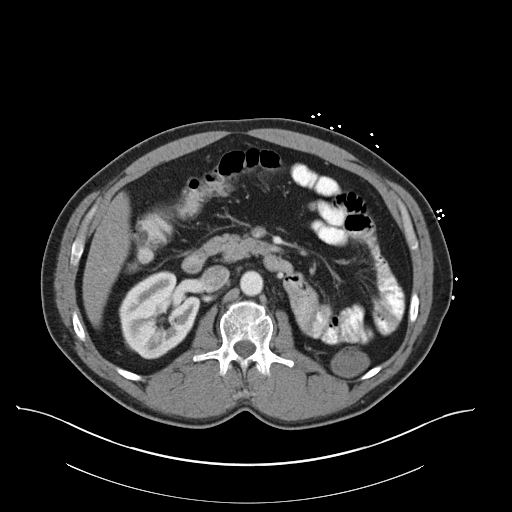
[im 66/101  bone]
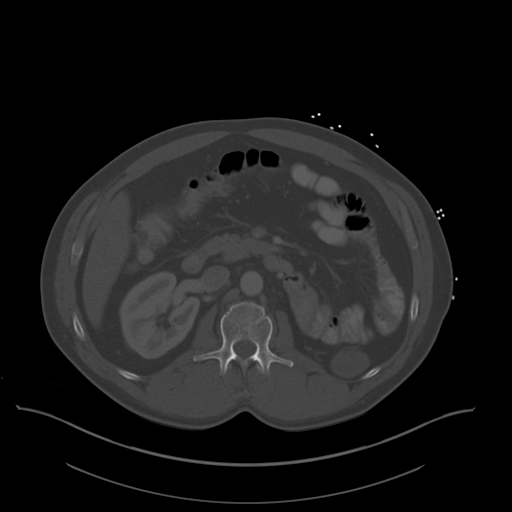
[im 71/101  soft-tissue]
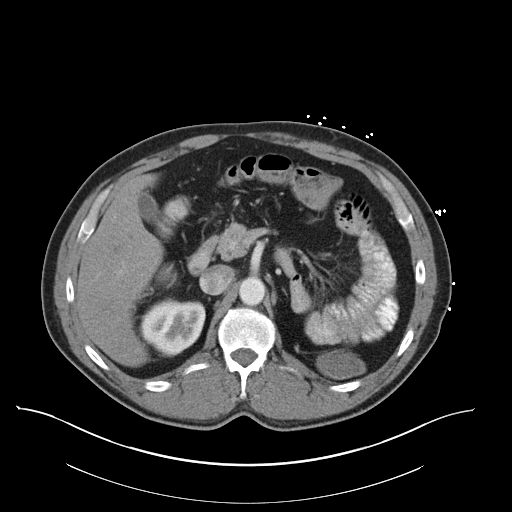
[im 81/101  soft-tissue]
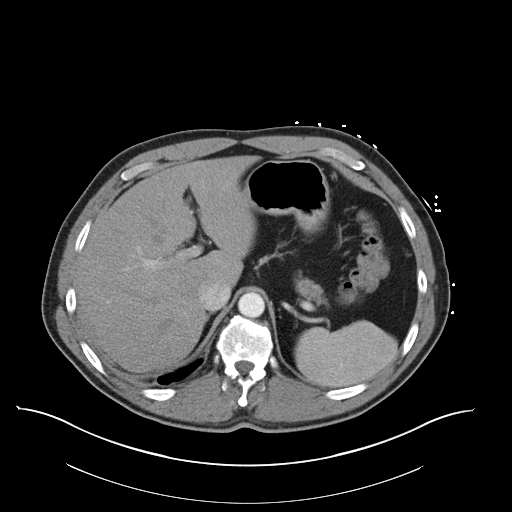
[im 81/101  lung]
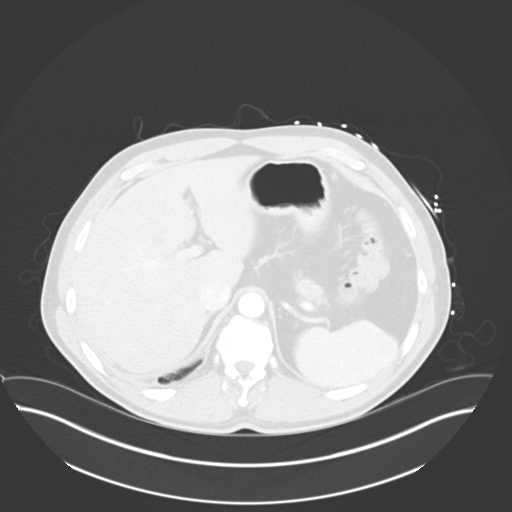
[im 86/101  soft-tissue]
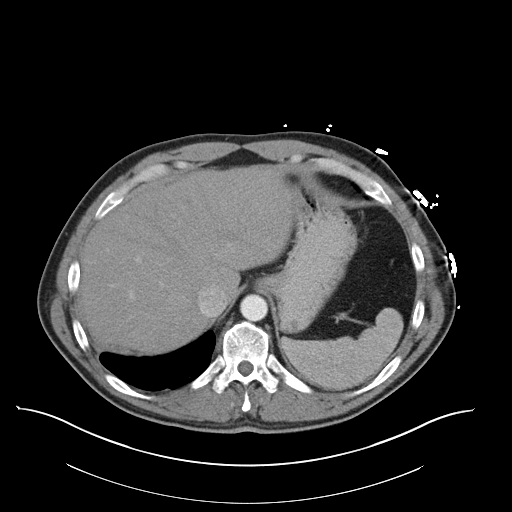
[im 86/101  lung]
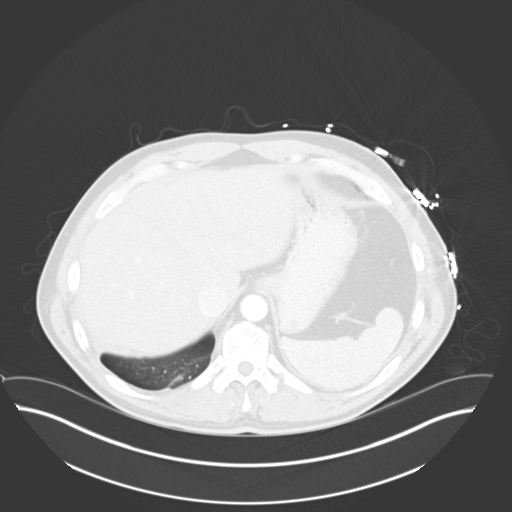
[im 91/101  lung]
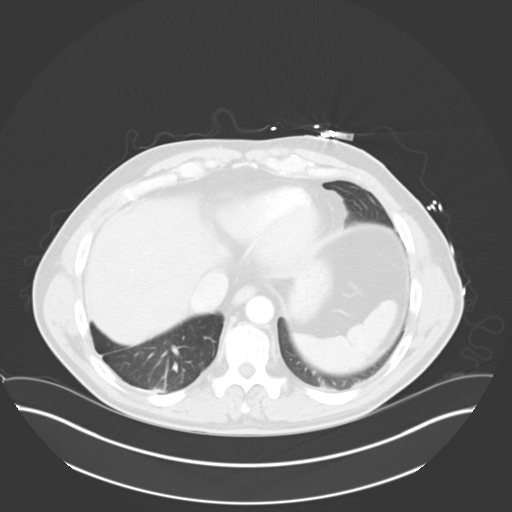
[im 96/101  soft-tissue]
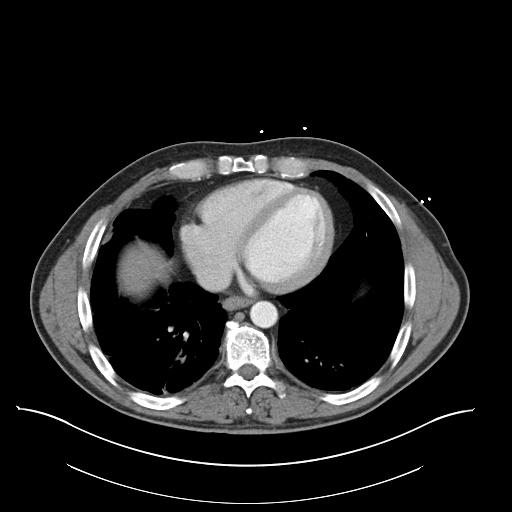
[im 96/101  lung]
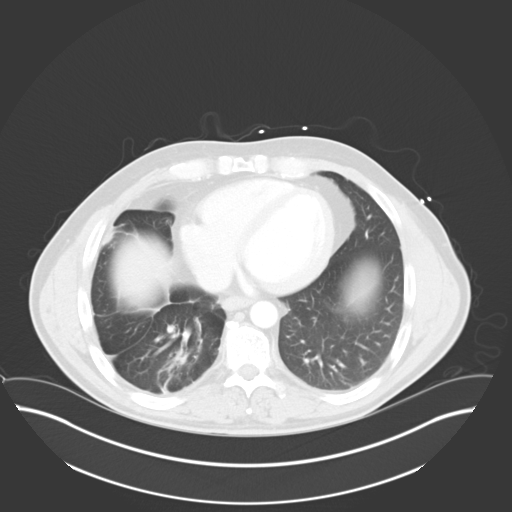

[14 of 32 positions shown; findings below may reference images not displayed]

FINDINGS: Heart is normal size.  Postoperative changes in the right
lung base.  Areas of scarring in the right lower lobe.  No
effusions.  No change since prior study.

Tiny low density area within the right hepatic lobe is stable.
Otherwise, liver, gallbladder, spleen, pancreas, adrenals and right
kidney are unremarkable.  Prior left nephrectomy.

 Large and small bowel grossly unremarkable.  No free fluid, free
air or adenopathy.  Urinary bladder is unremarkable.  Aorta is
normal caliber.  No acute bony abnormality.  The
IMPRESSION: Stable postoperative and chronic changes in the right lung base.

Stable tiny low density area in the right hepatic lobe, likely a
small cyst.

Prior left nephrectomy.

No acute findings in the abdomen or pelvis.

## 2013-08-23 ENCOUNTER — Other Ambulatory Visit: Payer: Self-pay | Admitting: *Deleted

## 2013-08-23 NOTE — Telephone Encounter (Signed)
THIS REFILL REQUEST FOR VOTRIENT WAS PLACED IN DR.SHADAD'S ACTIVE WORK FOLDER. 

## 2013-08-24 ENCOUNTER — Other Ambulatory Visit: Payer: Self-pay | Admitting: *Deleted

## 2013-08-24 DIAGNOSIS — C649 Malignant neoplasm of unspecified kidney, except renal pelvis: Secondary | ICD-10-CM

## 2013-08-24 MED ORDER — PAZOPANIB HCL 200 MG PO TABS: 800.0000 mg | ORAL_TABLET | Freq: Every morning | ORAL | Status: DC

## 2013-08-24 NOTE — Telephone Encounter (Signed)
Rx for votrient faxed to [facility].

## 2013-08-26 NOTE — Telephone Encounter (Signed)
RECEIVED A FAX FROM [facility] CONCERNING A CONFIRMATION OF PRESCRIPTION SHIPMENT FOR VOTRIENT ON 08/25/13.

## 2013-09-14 ENCOUNTER — Ambulatory Visit (HOSPITAL_BASED_OUTPATIENT_CLINIC_OR_DEPARTMENT_OTHER): Payer: BC Managed Care – PPO | Admitting: Oncology

## 2013-09-14 ENCOUNTER — Telehealth: Payer: Self-pay | Admitting: Oncology

## 2013-09-14 ENCOUNTER — Other Ambulatory Visit (HOSPITAL_BASED_OUTPATIENT_CLINIC_OR_DEPARTMENT_OTHER): Payer: BC Managed Care – PPO

## 2013-09-14 VITALS — BP 130/97 | HR 71 | Temp 97.6°F | Resp 18 | Ht 75.0 in | Wt 216.6 lb

## 2013-09-14 DIAGNOSIS — C7889 Secondary malignant neoplasm of other digestive organs: Secondary | ICD-10-CM

## 2013-09-14 DIAGNOSIS — C649 Malignant neoplasm of unspecified kidney, except renal pelvis: Secondary | ICD-10-CM

## 2013-09-14 DIAGNOSIS — G893 Neoplasm related pain (acute) (chronic): Secondary | ICD-10-CM

## 2013-09-14 DIAGNOSIS — F341 Dysthymic disorder: Secondary | ICD-10-CM

## 2013-09-14 DIAGNOSIS — C7800 Secondary malignant neoplasm of unspecified lung: Secondary | ICD-10-CM

## 2013-09-14 LAB — COMPREHENSIVE METABOLIC PANEL (CC13)
ALT: 53 U/L (ref 0–55)
AST: 31 U/L (ref 5–34)
Albumin: 3.7 g/dL (ref 3.5–5.0)
Alkaline Phosphatase: 98 U/L (ref 40–150)
BUN: 8.6 mg/dL (ref 7.0–26.0)
CO2: 27 mEq/L (ref 22–29)
Calcium: 9.3 mg/dL (ref 8.4–10.4)
Chloride: 105 mEq/L (ref 98–109)
Creatinine: 1.3 mg/dL (ref 0.7–1.3)
Glucose: 114 mg/dl (ref 70–140)
Potassium: 4.7 mEq/L (ref 3.5–5.1)
Sodium: 141 mEq/L (ref 136–145)
Total Bilirubin: 0.49 mg/dL (ref 0.20–1.20)
Total Protein: 7.9 g/dL (ref 6.4–8.3)

## 2013-09-14 LAB — CBC WITH DIFFERENTIAL/PLATELET
BASO%: 0.8 % (ref 0.0–2.0)
Basophils Absolute: 0 10*3/uL (ref 0.0–0.1)
EOS%: 2.9 % (ref 0.0–7.0)
Eosinophils Absolute: 0.2 10*3/uL (ref 0.0–0.5)
HCT: 45.6 % (ref 38.4–49.9)
HGB: 15.6 g/dL (ref 13.0–17.1)
LYMPH%: 24.8 % (ref 14.0–49.0)
MCH: 36.2 pg — ABNORMAL HIGH (ref 27.2–33.4)
MCHC: 34.3 g/dL (ref 32.0–36.0)
MCV: 105.6 fL — ABNORMAL HIGH (ref 79.3–98.0)
MONO#: 0.4 10*3/uL (ref 0.1–0.9)
MONO%: 6.3 % (ref 0.0–14.0)
NEUT#: 4.1 10*3/uL (ref 1.5–6.5)
NEUT%: 65.2 % (ref 39.0–75.0)
Platelets: 161 10*3/uL (ref 140–400)
RBC: 4.32 10*6/uL (ref 4.20–5.82)
RDW: 14.4 % (ref 11.0–14.6)
WBC: 6.3 10*3/uL (ref 4.0–10.3)
lymph#: 1.6 10*3/uL (ref 0.9–3.3)

## 2013-09-14 MED ORDER — ALPRAZOLAM 1 MG PO TABS: 1.0000 mg | ORAL_TABLET | Freq: Three times a day (TID) | ORAL | Status: DC | PRN

## 2013-09-14 NOTE — Progress Notes (Signed)
Hematology and Oncology Follow Up Visit  Keith [name] US:[id number] 1966/10/18 47 y.o. 09/14/2013 10:09 AM  Keith Bring, MD  Keith [name], M.D.  Keith Bent, MD  Keith Banister, MD    Principle Diagnosis: This is a 47 year old gentleman with stage IV renal cell carcinoma diagnosed in 2009.  Prior Therapy: 1. Status post laparoscopic radical nephrectomy.  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 2. Patient status post thoracotomy done October 2009.  He had a lower lobe nodule, biopsy proven to be metastatic renal cell carcinoma.   3. Patient is status post stereotactic radiotherapy to pulmonary nodules in May of 2010. 4. He is S/P Sutent 50 mg 4 weeks on 2 weeks off from November of 2011 to 03/2013. He progressed at that time.  5. He is S/P radiation to the right sacral bone between 4/22 to 4/30.    Current therapy: He is on Votrient 800 mg since 03/2013. His disease include pancreatic involvement with RCC as well as right  sacral lesion.   Interim History:  Keith [name] presents today for a followup visit.  He has tolerated Votrient very well without any complications this month. He did report grade 1 hand-foot syndrome. His pain in the his feet as well as the ankles. Reports pain to his chest radiating around to his back. States that he can no longer work up due to the pain. He has not been able to work since August of this year. States that he was given Percocet by his PCP for his pain. He continue to take Cymbalta as well. Has not reported any other complications. He is not reporting any neurological symptoms. Reports that he is weaker over all. However, performance status is essentially unchanged. He is anxious about the status of his cancer which is quite understandable at this time.  Medications: I have reviewed the patient's current medications.  Current Outpatient Prescriptions  Medication Sig Dispense Refill  . ALPRAZolam (XANAX) 1 MG tablet Take 1 tablet  (1 mg total) by mouth every 8 (eight) hours as needed. Anxiety  50 tablet  1  . DULoxetine (CYMBALTA) 60 MG capsule Take 60 mg by mouth every morning.       . magnesium hydroxide (MILK OF MAGNESIA) 400 MG/5ML suspension Take 15 mLs by mouth daily as needed for constipation.  360 mL  0  . oxyCODONE (OXY IR/ROXICODONE) 5 MG immediate release tablet Take 1-4 tablets (5-20 mg total) by mouth every 4 (four) hours as needed for pain.  120 tablet  0  . pazopanib (VOTRIENT) 200 MG tablet Take 4 tablets (800 mg total) by mouth every morning. Take on an empty stomach.  120 tablet  0  . traMADol (ULTRAM) 50 MG tablet Take 1 tablet (50 mg total) by mouth every 6 (six) hours as needed for pain.  60 tablet  1   No current facility-administered medications for this visit.     Allergies:  Allergies  Allergen Reactions  . Ceftriaxone Hives  . Hydrocodone Swelling    Past Medical History, Surgical history, Social history, and Family History were reviewed and updated.  Review of Systems:  Remaining ROS negative.  Physical Exam: Blood pressure 130/97, pulse 71, temperature 97.6 F (36.4 C), temperature source Oral, resp. rate 18, height 6\' 3"  (1.905 m), weight 216 lb 9.6 oz (98.249 kg). ECOG: 1 General appearance: alert Head: Normocephalic, without obvious abnormality, atraumatic Neck: no adenopathy, no carotid bruit, no JVD, supple, symmetrical, trachea midline and thyroid  not enlarged, symmetric, no tenderness/mass/nodules Lymph nodes: Cervical, supraclavicular, and axillary nodes normal. Heart:regular rate and rhythm, S1, S2 normal, no murmur, click, rub or gallop Lung:chest clear, no wheezing, rales, normal symmetric air entry. Chest wall pain is reproducible with pressure. Abdomen: soft, non-tender, without masses or organomegaly EXT:no edema, no despumation. No erythema noted. Tender to touch at the ankle and shin area on both sides.   Neuro: no focal deficits noted. No weakness noted in his  LE  Lab Results: Lab Results  Component Value Date   WBC 6.3 09/14/2013   HGB 15.6 09/14/2013   HCT 45.6 09/14/2013   MCV 105.6* 09/14/2013   PLT 161 09/14/2013    Impression and Plan:  This is a pleasant 47 year old gentleman with the following issues. 1. Metastatic renal cell carcinoma.  He has documented disease to the lung and the pancreas.  He tolerating Votrient without complications. CT scan from 6/27 did not show any new lesions. The plan is to continue the current medication without any change. I will repeat his CT scan ASAP given his new symptoms to assess for cancer progression.   2. Pancreatic thickening/mass.  This has been biopsy proven to be a renal cell metastasis. No change in treatment at this time. 3. Depression/Anxiety: He remains on Cymbalta and Xanax.  4. Constipation: improved.  5. Pain: His pain is a rather vague it starts around the sternum and radiates around the back of unclear etiology. It is certainly could be attributed to cancer progression versus complication from the both treatments. To determine this properly I will obtain a CT scan soon as possible to determine if there is any cancer progression, pleural effusion or any other pathology to explain his pain. If his cancer is stable week and consider decreasing his Votrient an attempt to improve his overall symptoms. 6. Follow up: 4 weeks.  Staphany Ditton 9/24/201410:09 AM

## 2013-09-14 NOTE — Telephone Encounter (Signed)
Gave pt appt for lab and MD on October, gave pt oral contrast for CT for September

## 2013-09-15 ENCOUNTER — Encounter: Payer: Self-pay | Admitting: Oncology

## 2013-09-15 NOTE — Progress Notes (Signed)
Faxed clinical information to [employer] @ [phone] °

## 2013-09-20 ENCOUNTER — Encounter (HOSPITAL_COMMUNITY): Payer: Self-pay

## 2013-09-20 ENCOUNTER — Encounter: Payer: Self-pay | Admitting: Oncology

## 2013-09-20 ENCOUNTER — Ambulatory Visit (HOSPITAL_COMMUNITY)
Admission: RE | Admit: 2013-09-20 | Discharge: 2013-09-20 | Disposition: A | Discharge status: Home / Self Care | Payer: BC Managed Care – PPO | Source: Ambulatory Visit | Attending: Oncology | Admitting: Oncology

## 2013-09-20 DIAGNOSIS — Z905 Acquired absence of kidney: Secondary | ICD-10-CM | POA: Insufficient documentation

## 2013-09-20 DIAGNOSIS — M899 Disorder of bone, unspecified: Secondary | ICD-10-CM | POA: Insufficient documentation

## 2013-09-20 DIAGNOSIS — C649 Malignant neoplasm of unspecified kidney, except renal pelvis: Secondary | ICD-10-CM | POA: Insufficient documentation

## 2013-09-20 MED ORDER — IOHEXOL 300 MG/ML  SOLN
100.0000 mL | Freq: Once | INTRAMUSCULAR | Status: AC | PRN
  Administered 2013-09-20: 100 mL via INTRAVENOUS

## 2013-09-20 NOTE — Progress Notes (Signed)
Put disability paper on nurse's desk.

## 2013-09-22 ENCOUNTER — Encounter: Payer: Self-pay | Admitting: Oncology

## 2013-09-22 NOTE — Progress Notes (Signed)
Faxed disability form to [employer] @ [phone] °

## 2013-09-24 IMAGING — CT CT ABD-PEL WO/W CM
2 of 10 series · 12 of 46 positions shown, 18 images · IV contrast (omnipaque)
Comparison: Chest, abdomen and pelvic CT [DATE] and abdominal
pelvic CT 10/09/2011.

CT CHEST

CLINICAL DATA: Renal cell carcinoma with pulmonary metastases.
New pancreatic mass on endoscopy (pancreas biopsy 09/18/2011
demonstrated atypical cells consistent with metastatic renal cell
carcinoma).  History of left nephrectomy and radiation therapy.
Chemotherapy ongoing.

CT CHEST WITH ABDOMEN PELVIS BOTH
TECHNIQUE: Multidetector CT imaging of the abdomen and pelvis was
performed without intravenous contrast. Multidetector CT imaging of
the chest, abdomen and pelvis was then performed during bolus
administration of intravenous contrast.
Contrast: 100mL OMNIPAQUE IOHEXOL 300 MG/ML IV SOLN

[Series 7: venous thins pacs · axial · portal-venous · 0.81mm/px · z∈[-683,-125]mm · 9 of 228 slices shown, 15 images]
[im 21/228  soft-tissue]
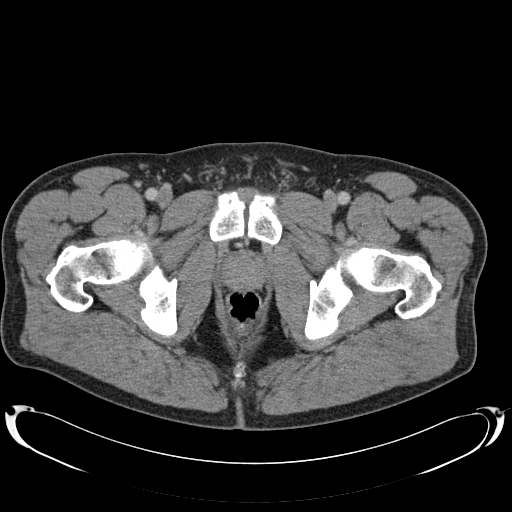
[im 21/228  bone]
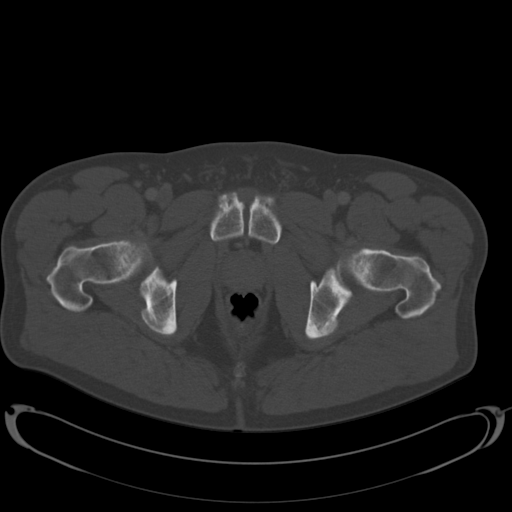
[im 42/228  soft-tissue]
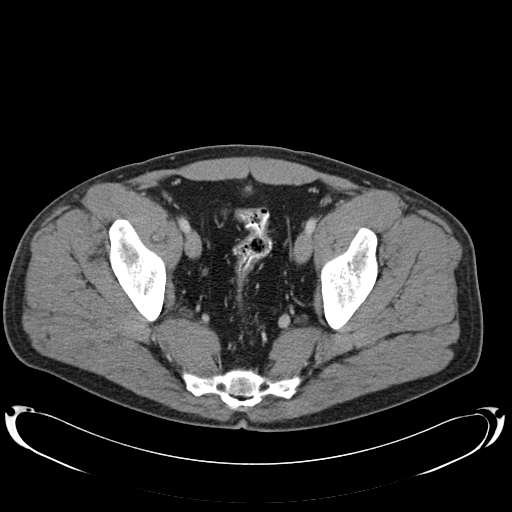
[im 62/228  soft-tissue]
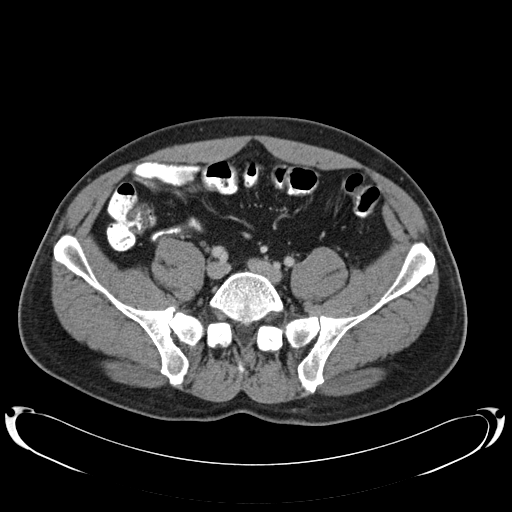
[im 83/228  soft-tissue]
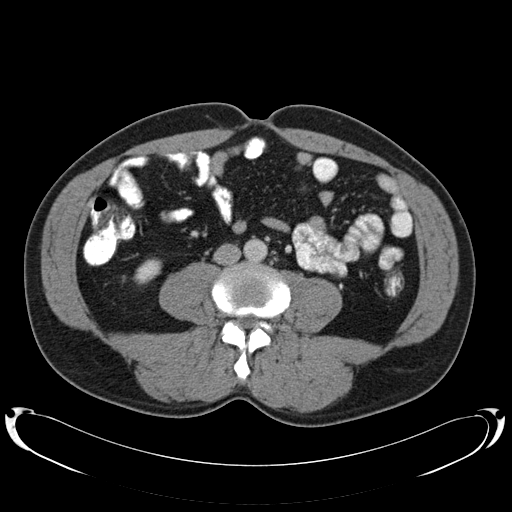
[im 124/228  soft-tissue]
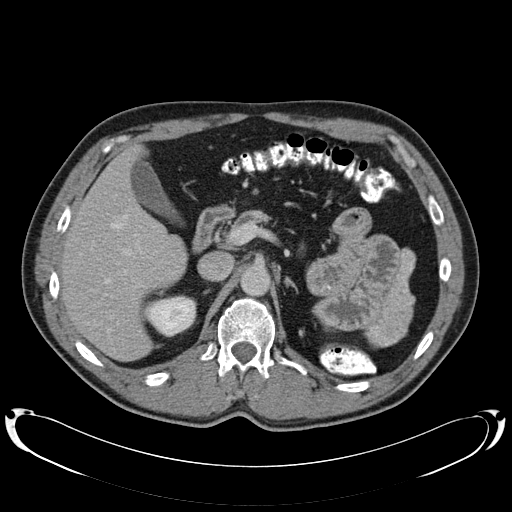
[im 145/228  soft-tissue]
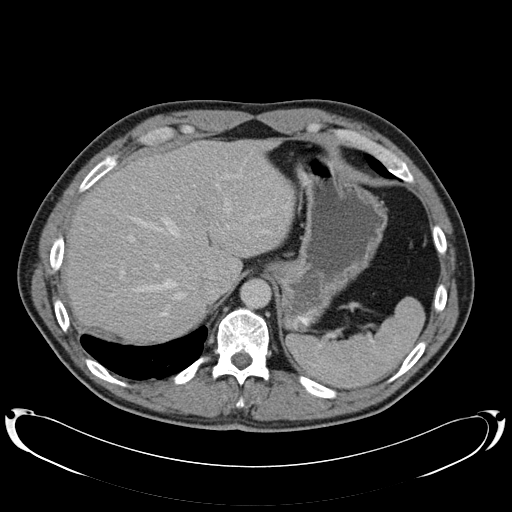
[im 145/228  lung]
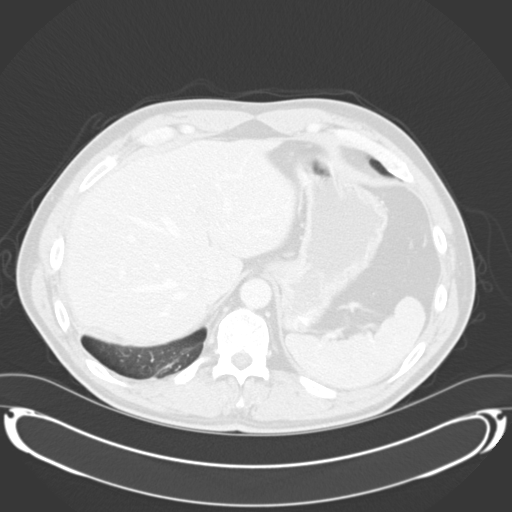
[im 166/228  soft-tissue]
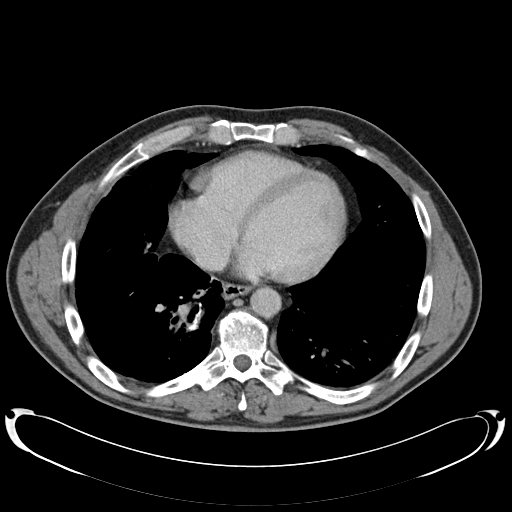
[im 166/228  lung]
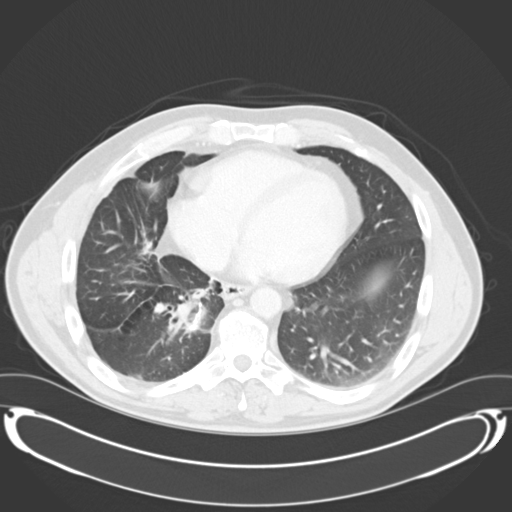
[im 186/228  soft-tissue]
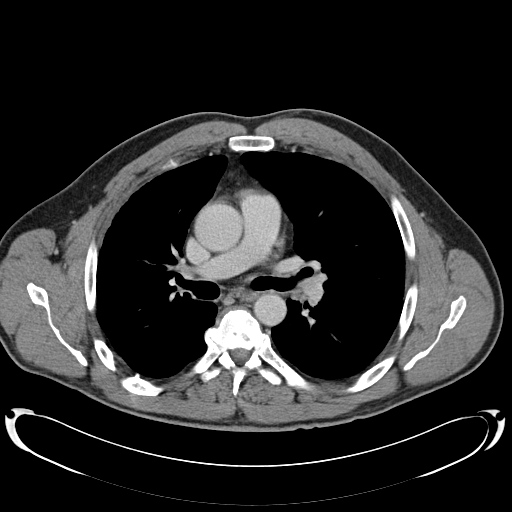
[im 186/228  lung]
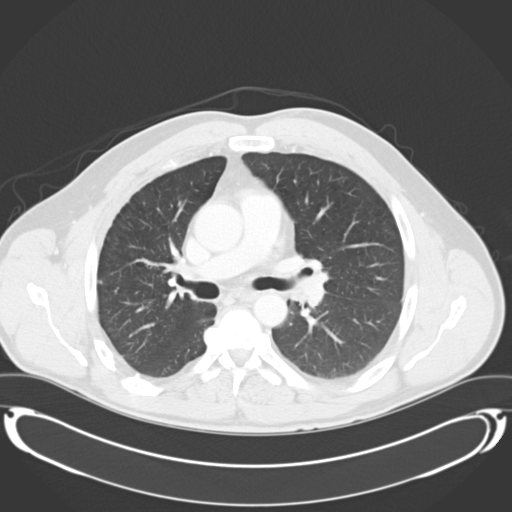
[im 207/228  soft-tissue]
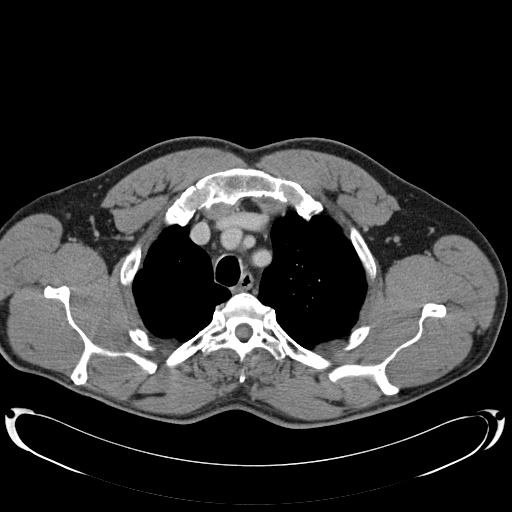
[im 207/228  lung]
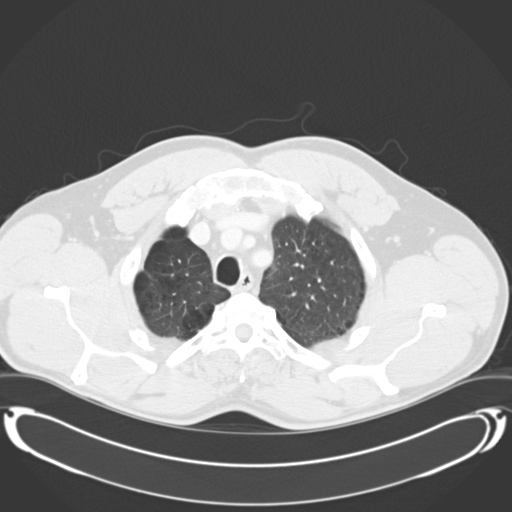
[im 207/228  bone]
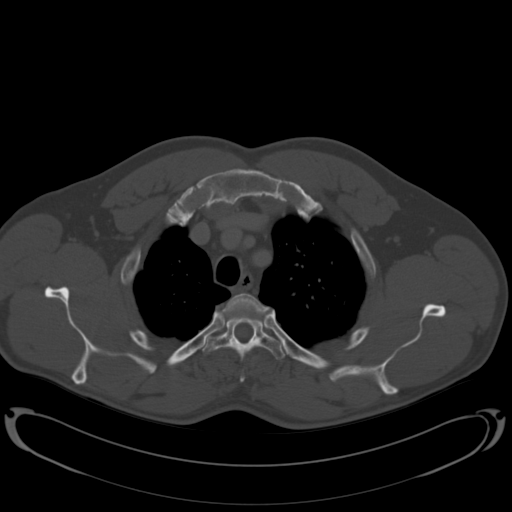

[Series 602: arterial images · coronal · arterial · 0.81mm/px · 3 of 139 slices shown]
[im 28/139  soft-tissue]
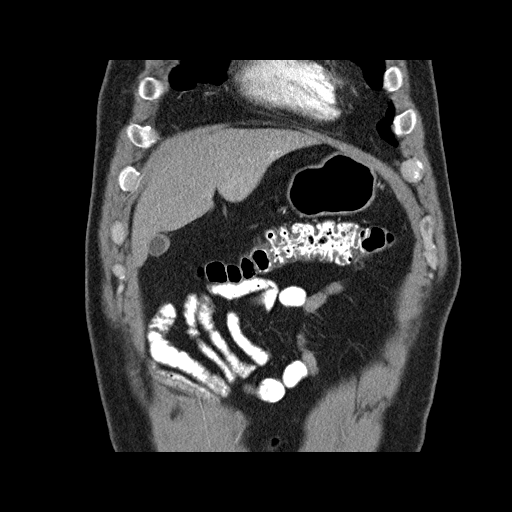
[im 56/139  soft-tissue]
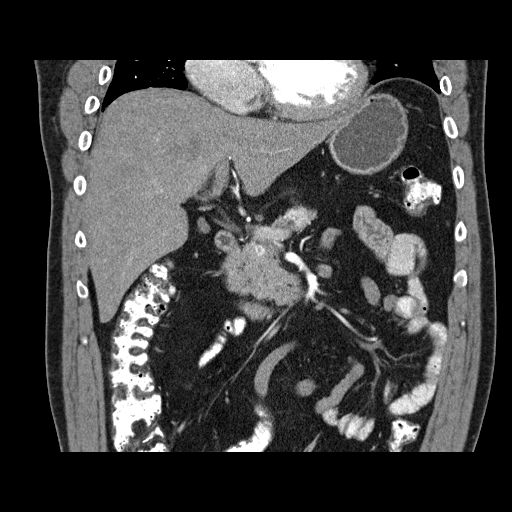
[im 83/139  soft-tissue]
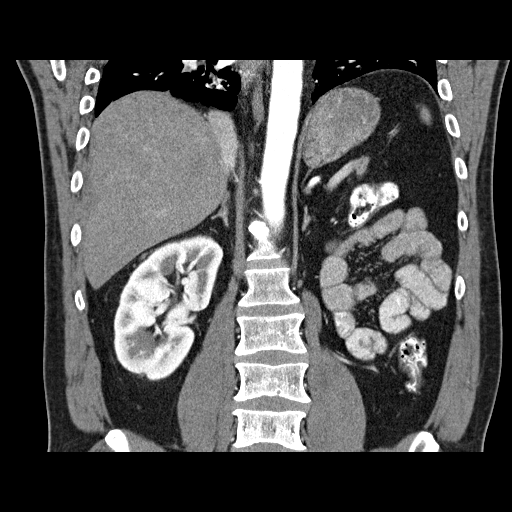

[12 of 46 positions shown; findings below may reference images not displayed]

FINDINGS: The volume loss and perihilar nodularity in the right
lung are unchanged, attributed to radiation fibrosis.  Tiny
subpleural density in the right upper lobe on image 26 is stable.
There are no new or enlarging pulmonary nodules.  Emphysematous
changes are stable.

No enlarged mediastinal or hilar lymph nodes are identified.  There
is stable pleural thickening on the right.  No significant pleural
or pericardial effusion is present.  There are no suspicious
osseous findings.
IMPRESSION: Stable chest CT with right perihilar radiation changes.  No
evidence of metastatic disease.

CT ABDOMEN AND PELVIS
FINDINGS: There is slightly progressive pancreatic ductal
dilatation within the body and tail of the pancreas.  Pancreatic
atrophy appears unchanged.  There are least four enhancing
pancreatic masses, best seen on the arterial phase images (not
performed on prior studies).  Largest lesion is located in the
proximal pancreatic tail, measuring 1.4 x 1.8 cm on image 43.  This
is not significantly changed from 08/01/2011. Several other smaller
lesions are noted within the pancreatic head, measuring 9 mm on
image 52 and 11 mm on image 55.

Low-density lesion in the right hepatic lobe on portal phase image
57 is stable.  There are no suspicious liver lesions.  The
gallbladder, spleen, adrenal glands and right kidney appear normal.
The left nephrectomy bed has a stable appearance.

No enlarged abdominal pelvic lymph nodes are identified.  There is
no ascites or peritoneal nodularity.  No bowel lesions or
suspicious osseous lesions are seen.  The appendix, bladder,
prostate gland and seminal vesicles appear unremarkable.
IMPRESSION: 1.  There are multiple enhancing pancreatic masses associated with
pancreatic ductal dilatation and atrophy.  These lesions are more
obvious on the current study which includes arterial phase images.
As correlated with prior studies, these lesions appear to be slowly
growing and are consistent with renal cell metastases per previous
biopsy.  Multifocal neuroendocrine pancreatic tumor would have a
similar appearance.
2.  No other evidence of metastatic disease.

## 2013-10-04 ENCOUNTER — Other Ambulatory Visit: Payer: Self-pay | Admitting: *Deleted

## 2013-10-04 DIAGNOSIS — C649 Malignant neoplasm of unspecified kidney, except renal pelvis: Secondary | ICD-10-CM

## 2013-10-04 MED ORDER — PAZOPANIB HCL 200 MG PO TABS: 800.0000 mg | ORAL_TABLET | Freq: Every morning | ORAL | Status: DC

## 2013-10-04 NOTE — Telephone Encounter (Signed)
E-scribed  votrient to biologics, per dr Hazeline Junker last note, he is to continue on same medication. Wife penny calling to say patient had taken his last pill and patient was not sure if he is to continue.

## 2013-10-06 NOTE — Telephone Encounter (Signed)
RECEIVED A FAX FROM [facility] CONCERNING A CONFIRMATION OF PRESCRIPTION SHIPMENT FOR VOTRIENT ON 10/05/13.

## 2013-10-14 ENCOUNTER — Ambulatory Visit (HOSPITAL_BASED_OUTPATIENT_CLINIC_OR_DEPARTMENT_OTHER): Payer: Self-pay | Admitting: Oncology

## 2013-10-14 ENCOUNTER — Telehealth: Payer: Self-pay | Admitting: Oncology

## 2013-10-14 ENCOUNTER — Other Ambulatory Visit (HOSPITAL_BASED_OUTPATIENT_CLINIC_OR_DEPARTMENT_OTHER): Payer: Self-pay | Admitting: Lab

## 2013-10-14 VITALS — BP 154/98 | HR 68 | Temp 96.9°F | Resp 18 | Ht 75.0 in | Wt 225.3 lb

## 2013-10-14 DIAGNOSIS — C7889 Secondary malignant neoplasm of other digestive organs: Secondary | ICD-10-CM

## 2013-10-14 DIAGNOSIS — C649 Malignant neoplasm of unspecified kidney, except renal pelvis: Secondary | ICD-10-CM

## 2013-10-14 DIAGNOSIS — K5900 Constipation, unspecified: Secondary | ICD-10-CM

## 2013-10-14 DIAGNOSIS — R079 Chest pain, unspecified: Secondary | ICD-10-CM

## 2013-10-14 DIAGNOSIS — C7800 Secondary malignant neoplasm of unspecified lung: Secondary | ICD-10-CM

## 2013-10-14 DIAGNOSIS — F411 Generalized anxiety disorder: Secondary | ICD-10-CM

## 2013-10-14 LAB — CBC WITH DIFFERENTIAL/PLATELET
BASO%: 0.6 % (ref 0.0–2.0)
Basophils Absolute: 0 10*3/uL (ref 0.0–0.1)
EOS%: 2.6 % (ref 0.0–7.0)
Eosinophils Absolute: 0.2 10*3/uL (ref 0.0–0.5)
HCT: 44.7 % (ref 38.4–49.9)
HGB: 15 g/dL (ref 13.0–17.1)
LYMPH%: 22.9 % (ref 14.0–49.0)
MCH: 35.1 pg — ABNORMAL HIGH (ref 27.2–33.4)
MCHC: 33.5 g/dL (ref 32.0–36.0)
MCV: 104.7 fL — ABNORMAL HIGH (ref 79.3–98.0)
MONO#: 0.5 10*3/uL (ref 0.1–0.9)
MONO%: 7.9 % (ref 0.0–14.0)
NEUT#: 4 10*3/uL (ref 1.5–6.5)
NEUT%: 66 % (ref 39.0–75.0)
Platelets: 151 10*3/uL (ref 140–400)
RBC: 4.27 10*6/uL (ref 4.20–5.82)
RDW: 14.4 % (ref 11.0–14.6)
WBC: 6.1 10*3/uL (ref 4.0–10.3)
lymph#: 1.4 10*3/uL (ref 0.9–3.3)

## 2013-10-14 LAB — COMPREHENSIVE METABOLIC PANEL (CC13)
ALT: 53 U/L (ref 0–55)
AST: 27 U/L (ref 5–34)
Albumin: 3.6 g/dL (ref 3.5–5.0)
Alkaline Phosphatase: 94 U/L (ref 40–150)
Anion Gap: 9 mEq/L (ref 3–11)
BUN: 11.6 mg/dL (ref 7.0–26.0)
CO2: 27 mEq/L (ref 22–29)
Calcium: 9.5 mg/dL (ref 8.4–10.4)
Chloride: 104 mEq/L (ref 98–109)
Creatinine: 1.2 mg/dL (ref 0.7–1.3)
Glucose: 147 mg/dl — ABNORMAL HIGH (ref 70–140)
Potassium: 4.6 mEq/L (ref 3.5–5.1)
Sodium: 140 mEq/L (ref 136–145)
Total Bilirubin: 0.66 mg/dL (ref 0.20–1.20)
Total Protein: 7.8 g/dL (ref 6.4–8.3)

## 2013-10-14 NOTE — Progress Notes (Signed)
Hematology and Oncology Follow Up Visit  Keith [name] [id number] 29-Aug-1966 47 y.o. 10/14/2013 8:50 AM  Keith Bring, MD  [name], M.D.  [name], MD  [name], MD    Principle Diagnosis: This is a 47 year old gentleman with stage IV renal cell carcinoma diagnosed in 2009.  Prior Therapy: 1. Status post laparoscopic radical nephrectomy.  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 2. Patient status post thoracotomy done October 2009.  He had a lower lobe nodule, biopsy proven to be metastatic renal cell carcinoma.   3. Patient is status post stereotactic radiotherapy to pulmonary nodules in May of 2010. 4. He is S/P Sutent 50 mg 4 weeks on 2 weeks off from November of 2011 to 03/2013. He progressed at that time.  5. He is S/P radiation to the right sacral bone between 4/22 to 4/30.    Current therapy: He is on Votrient 800 mg since 03/2013. His disease include pancreatic involvement with RCC as well as right sacral lesion.   Interim History:  Mr. Keith [name] presents today for a followup visit.  He has tolerated Votrient very well without any new complications this month. He did report grade 1 hand-foot syndrome. His pain in the his feet as well as the ankles. Reports pain to his chest radiating around to his back is stable and not any worse.  He continue to take Cymbalta as well. Has not reported any other complications. He is not reporting any neurological symptoms. Reports that he is weaker over all. However, performance status is essentially unchanged. He gained weight since his last visit.   Medications: I have reviewed the patient's current medications.  Current Outpatient Prescriptions  Medication Sig Dispense Refill  . ALPRAZolam (XANAX) 1 MG tablet Take 1 tablet (1 mg total) by mouth every 8 (eight) hours as needed. Anxiety  50 tablet  1  . DULoxetine (CYMBALTA) 60 MG capsule Take 60 mg by mouth every morning.       . magnesium hydroxide (MILK  OF MAGNESIA) 400 MG/5ML suspension Take 15 mLs by mouth daily as needed for constipation.  360 mL  0  . oxyCODONE (OXY IR/ROXICODONE) 5 MG immediate release tablet Take 1-4 tablets (5-20 mg total) by mouth every 4 (four) hours as needed for pain.  120 tablet  0  . pazopanib (VOTRIENT) 200 MG tablet Take 4 tablets (800 mg total) by mouth every morning. Take on an empty stomach.  120 tablet  0  . traMADol (ULTRAM) 50 MG tablet Take 1 tablet (50 mg total) by mouth every 6 (six) hours as needed for pain.  60 tablet  1   No current facility-administered medications for this visit.     Allergies:  Allergies  Allergen Reactions  . Ceftriaxone Hives  . Hydrocodone Swelling    Past Medical History, Surgical history, Social history, and Family History were reviewed and updated.  Review of Systems:  Remaining ROS negative.  Physical Exam: Blood pressure 154/98, pulse 68, temperature 96.9 F (36.1 C), temperature source Oral, resp. rate 18, height 6\' 3"  (1.905 m), weight 225 lb 4.8 oz (102.195 kg), SpO2 98.00%. ECOG: 1 General appearance: alert Head: Normocephalic, without obvious abnormality, atraumatic Neck: no adenopathy, no carotid bruit, no JVD, supple, symmetrical, trachea midline and thyroid not enlarged, symmetric, no tenderness/mass/nodules Lymph nodes: Cervical, supraclavicular, and axillary nodes normal. Heart:regular rate and rhythm, S1, S2 normal, no murmur, click, rub or gallop Lung:chest clear, no wheezing, rales, normal symmetric air entry.  Chest wall pain is reproducible with pressure. Abdomen: soft, non-tender, without masses or organomegaly EXT:no edema, no despumation. No erythema noted. Tender to touch at the ankle and shin area on both sides.   Neuro: no focal deficits noted. No weakness noted in his LE  Lab Results: Lab Results  Component Value Date   WBC 6.1 10/14/2013   HGB 15.0 10/14/2013   HCT 44.7 10/14/2013   MCV 104.7* 10/14/2013   PLT 151 10/14/2013    EXAM:  CT CHEST, ABDOMEN, AND PELVIS WITH CONTRAST  TECHNIQUE:  Multidetector CT imaging of the chest, abdomen and pelvis was  performed following the standard protocol during bolus  administration of intravenous contrast.  CONTRAST: 154mL OMNIPAQUE IOHEXOL 300 MG/ML SOLN  COMPARISON: CT 06/17/2013.  FINDINGS:  CT CHEST FINDINGS  No axillary or supraclavicular lymphadenopathy. No mediastinal or  hilar lymphadenopathy. Review of the lung parenchyma demonstrates  pleural-parenchymal thickening surrounding right hilum extending  into the right middle lobe and right lower lobe. The rounded aspect  of this thickening (measured on comparison exam) is unchanged  measuring 20 x 17 mm compared to 22 by 16 mm on prior remeasured  (image 51, series 9). There is new no new nodularity within the left  or right lung.  CT ABDOMEN AND PELVIS FINDINGS  No focal hepatic lesion. Post cholecystectomy. Again demonstrated  hypervascular lesions within the pancreas. The largest lesion in the  mid pancreas measures 2.8 by 2.0 cm compared to 3.0 x 1.8 cm on  prior for no change. Proximal to this lesion there is dilatation of  the pancreatic duct to 6 mm which is unchanged. Two additional  hypervascular lesions in the head of the pancreas measure 10 mm 11  mm (image 42, series 4) which are also unchanged.  Spleen and adrenal glands are normal. Status post left nephrectomy.  No nodularity in the left nephrectomy bed. Right kidney demonstrates  a small exophytic lesion extending from the lower pole measuring 11  mm (image 25, series 4) which is not changed from comparison exam.  The stomach, small bowel, appendix, and colon are unremarkable.  Abdominal aorta is normal in caliber. No retroperitoneal or  periportal lymphadenopathy.  Review of the skeleton demonstrates mild interval enlargement of the  lytic right sacral lesion measuring 30 mm x 24 mm compared to 26 x  23 mm on prior. No new lytic lesions  are noted in review of the  skeleton.  IMPRESSION:  CT CHEST IMPRESSION  1. No disease progression within the thorax. Stable exam.  2. Peribronchial nodular thickening in the perihilar nodular  thickening in the right middle lobe and right lower lobe is  unchanged.  CT ABDOMEN AND PELVIS IMPRESSION  1. Stablehypervascular lesions in the pancreas with ductal  dilatation unchanged from prior.  2. Small enhancing lesion inferior pole of the right kidney is  unchanged.  3. Stable left nephrectomy bed without evidence of local recurrence.  4. Mild interval increase in size of right sacral ala lytic lesion.   Impression and Plan:  This is a pleasant 47 year old gentleman with the following issues. 1. Metastatic renal cell carcinoma.  He has documented disease to the lung and the pancreas.  He tolerating Votrient without complications. CT scan from 09/20/2013 was discussed today with the patient and his wife and did not show any new lesions. The plan is to continue the current medication without any change. I will repeat his CT scan in 4 months.   2. Pancreatic thickening/mass.  This has been biopsy proven to be a renal cell metastasis. No change in treatment at this time. 3. Depression/Anxiety: He remains on Cymbalta and Xanax.  4. Constipation: improved.  5. Pain: His pain is controlled now.  6. Sacral mass: S/P radiation. No pain reported.  7. Follow up: 4 weeks.  Mickael Mcnutt 10/24/20148:50 AM

## 2013-10-14 NOTE — Telephone Encounter (Signed)
Gave pt appt for 11/28th lab and MD

## 2013-10-17 ENCOUNTER — Telehealth: Payer: Self-pay | Admitting: Oncology

## 2013-10-17 ENCOUNTER — Telehealth: Payer: Self-pay | Admitting: *Deleted

## 2013-10-17 ENCOUNTER — Encounter: Payer: Self-pay | Admitting: Oncology

## 2013-10-17 NOTE — Telephone Encounter (Signed)
Message copied by [name] on Mon Oct 17, 2013 10:57 AM ------      Message from: Sharlynn Oliphant A      Created: Mon Oct 17, 2013  9:33 AM      Regarding: Keith [name]: [id number]       Keith [name] requested a note to be faxed to Keith [name]; attn Keith [name]; fax # [phone] stating he will be unable to return to work this week. Please state he will be able to return 10/29/13. Please call patient to let him know we were able to do this.      Thanks,      Keith [name] ------

## 2013-10-17 NOTE — Telephone Encounter (Signed)
Pt called requesting a note stating he will not be able to return to work this week. He will be able to return on 11/8. He states he and [name] discussed this last week. Kristin Curcio,NP to take care of this.

## 2013-10-17 NOTE — Telephone Encounter (Signed)
Letter to return to work faxed to [employer] at [phone].

## 2013-10-27 ENCOUNTER — Other Ambulatory Visit: Payer: Self-pay

## 2013-10-28 ENCOUNTER — Other Ambulatory Visit: Payer: Self-pay | Admitting: *Deleted

## 2013-10-28 NOTE — Telephone Encounter (Signed)
THIS REFILL REQUEST FOR VOTRIENT WAS PLACED IN DR.SHADAD'S ACTIVE WORK FOLDER. 

## 2013-10-31 ENCOUNTER — Other Ambulatory Visit: Payer: Self-pay | Admitting: *Deleted

## 2013-10-31 DIAGNOSIS — C649 Malignant neoplasm of unspecified kidney, except renal pelvis: Secondary | ICD-10-CM

## 2013-10-31 MED ORDER — PAZOPANIB HCL 200 MG PO TABS: 800.0000 mg | ORAL_TABLET | Freq: Every morning | ORAL | Status: DC

## 2013-11-01 NOTE — Telephone Encounter (Signed)
RECEIVED A FAX FROM [facility] CONCERNING A CONFIRMATION OF PRESCRIPTION SHIPMENT FOR VOTRIENT ON 10/31/13.

## 2013-11-03 IMAGING — CT CT ANGIO CHEST
1 of 2 series · 19 of 32 positions shown · IV contrast (APPLIED)
Comparison: Chest radiograph dated 12/31/2011.  CT chest dated
11/21/2011/

CLINICAL DATA: Shortness of breath, chest pain, stage IV renal
cancer with lung metastases, evaluate for PE

CT ANGIOGRAPHY CHEST
TECHNIQUE: Multidetector CT imaging of the chest using the
standard protocol during bolus administration of intravenous
contrast. Multiplanar reconstructed images including MIPs were
obtained and reviewed to evaluate the vascular anatomy.

[Series 8: thins for pacs · axial · 0.76mm/px · z∈[+206,+436]mm · 19 of 256 slices shown]
[im 13/256  lung]
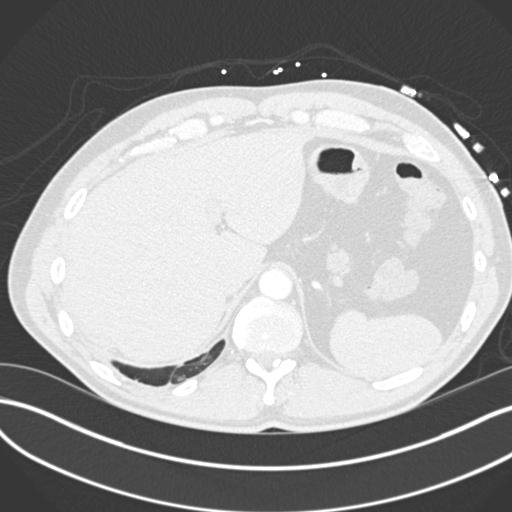
[im 26/256  mediastinal]
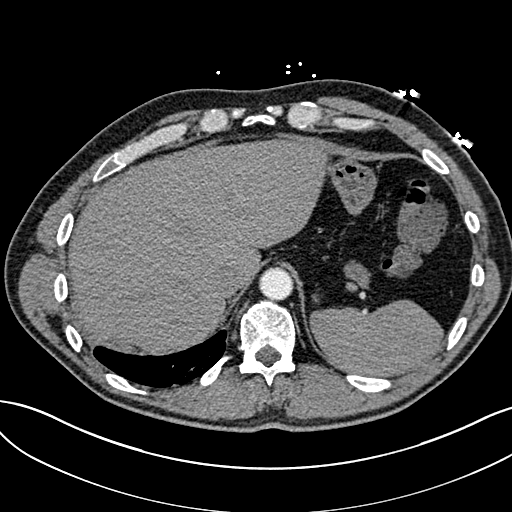
[im 39/256  lung]
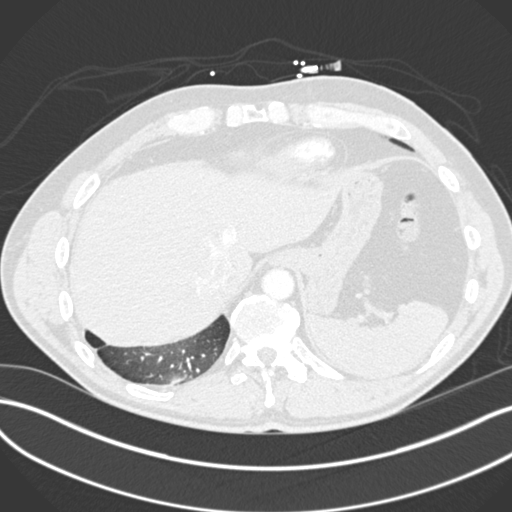
[im 64/256  mediastinal]
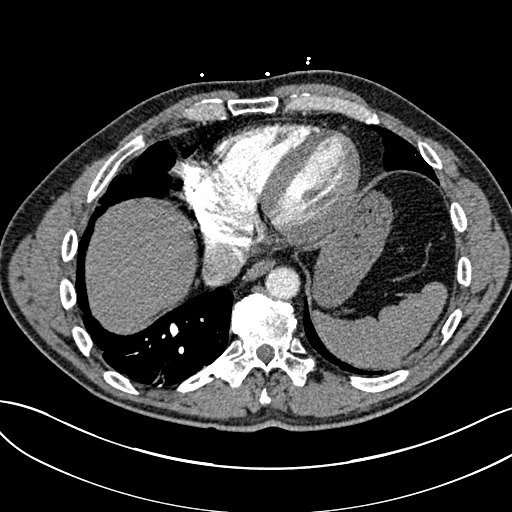
[im 77/256  lung]
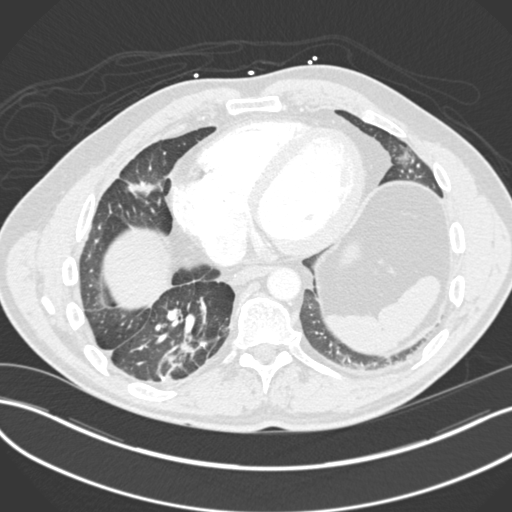
[im 86/256  mediastinal]
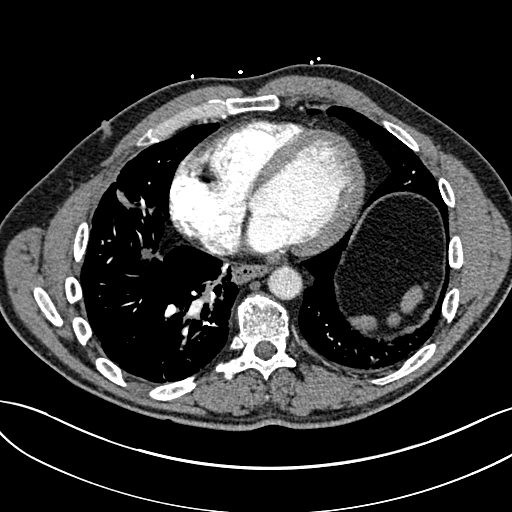
[im 90/256  lung]
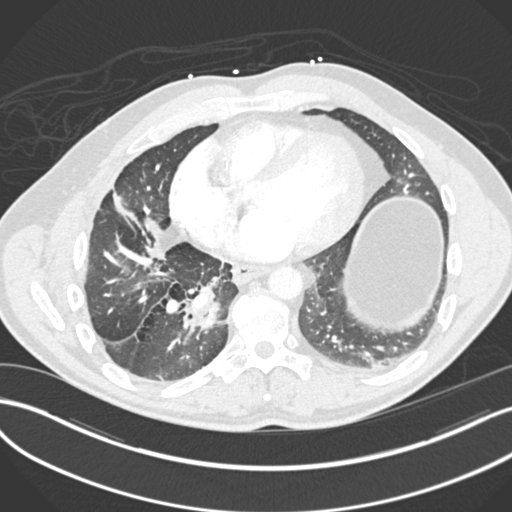
[im 103/256  mediastinal]
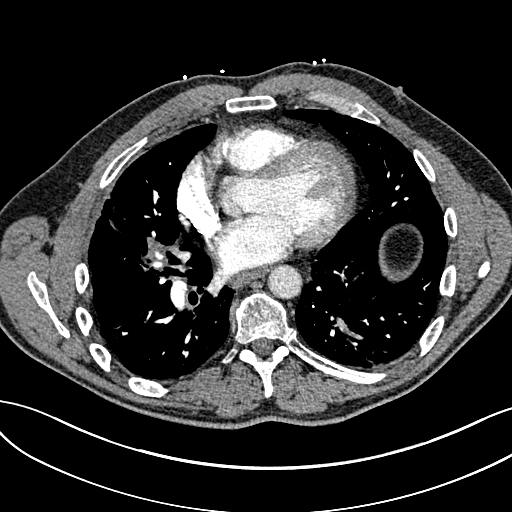
[im 115/256  lung]
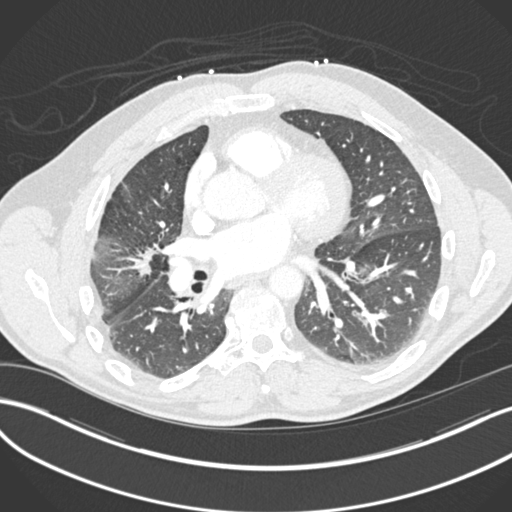
[im 128/256  mediastinal]
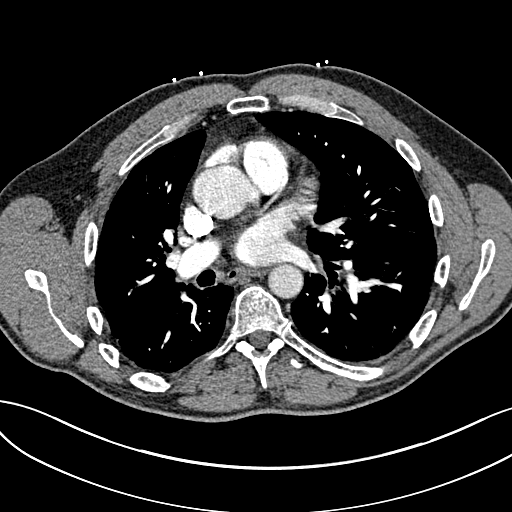
[im 141/256  lung]
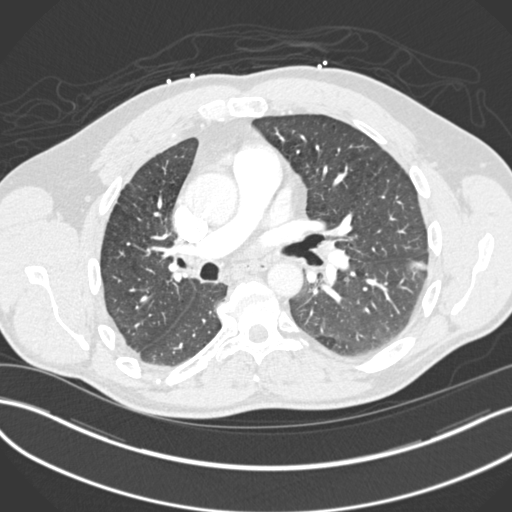
[im 154/256  mediastinal]
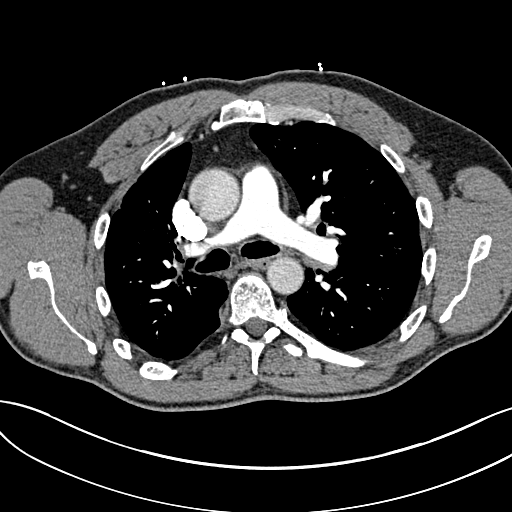
[im 166/256  lung]
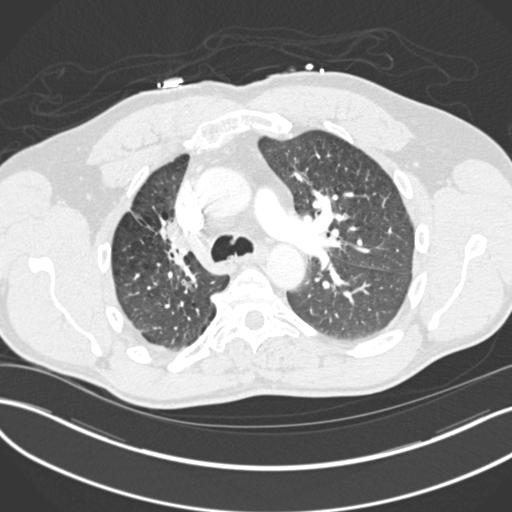
[im 171/256  mediastinal]
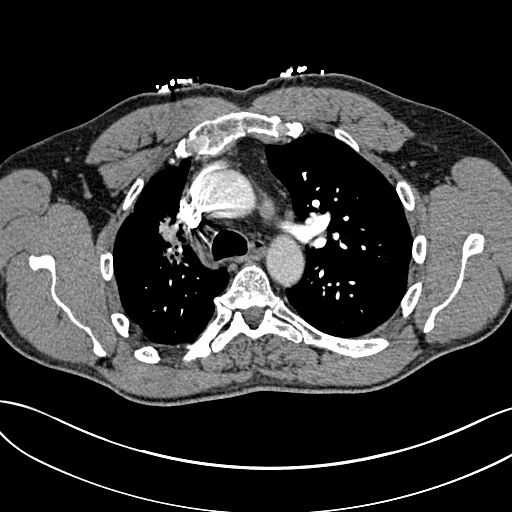
[im 179/256  lung]
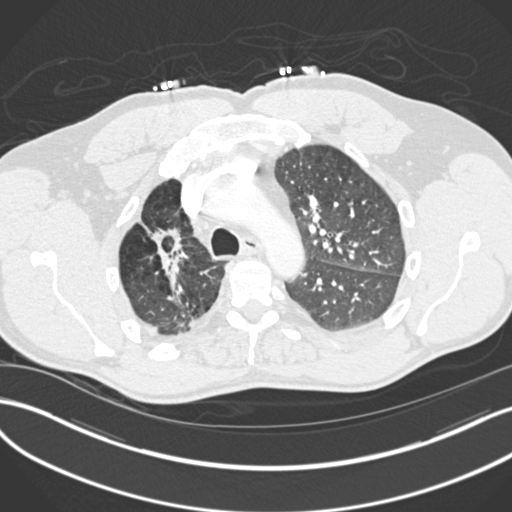
[im 192/256  mediastinal]
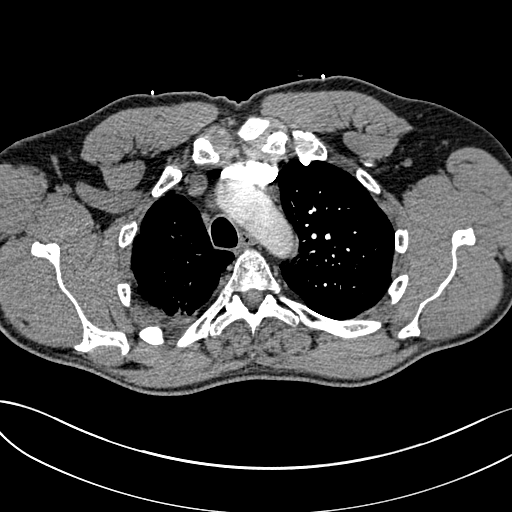
[im 217/256  lung]
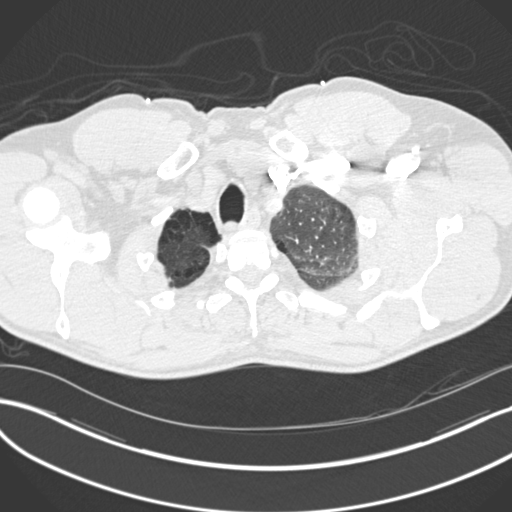
[im 230/256  mediastinal]
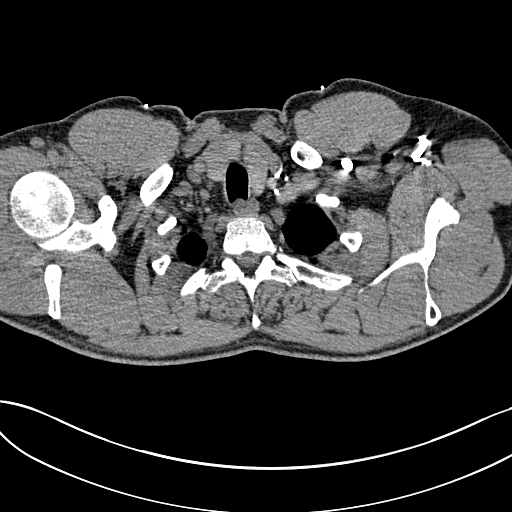
[im 243/256  lung]
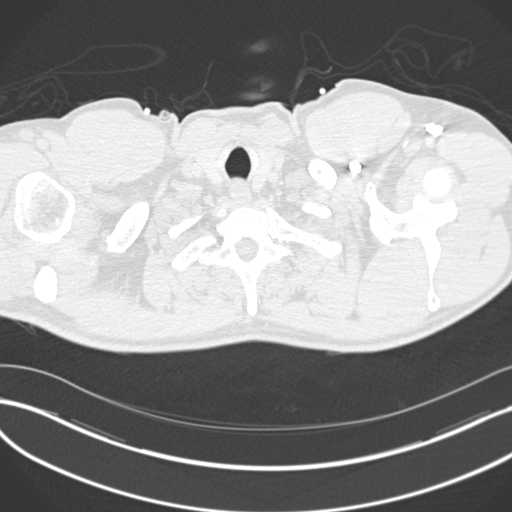

[19 of 32 positions shown; findings below may reference images not displayed]

FINDINGS: No evidence of pulmonary embolism.

Right upper lobe/perihilar radiation changes.  Linear scarring
versus atelectasis in the right lower lobe.  Paraseptal
emphysematous changes.  No new/suspicious pulmonary nodules. No
pleural effusion or pneumothorax.

Visualized thyroid is unremarkable.

The heart is normal in size.  No pericardial effusion.

No suspicious mediastinal, hilar, or axillary lymphadenopathy.

Visualized upper abdomen is unremarkable.

Degenerative changes of the visualized thoracolumbar spine.
IMPRESSION: No evidence of pulmonary embolism.

Stable right lung scarring/radiation changes.  No evidence of
metastatic disease in the chest.

Underlying paraseptal emphysematous changes.

## 2013-11-18 ENCOUNTER — Ambulatory Visit (HOSPITAL_BASED_OUTPATIENT_CLINIC_OR_DEPARTMENT_OTHER): Payer: Self-pay | Admitting: Oncology

## 2013-11-18 ENCOUNTER — Other Ambulatory Visit (HOSPITAL_BASED_OUTPATIENT_CLINIC_OR_DEPARTMENT_OTHER): Payer: Self-pay | Admitting: Lab

## 2013-11-18 VITALS — BP 138/94 | HR 82 | Temp 97.8°F | Resp 18 | Ht 75.0 in | Wt 223.6 lb

## 2013-11-18 DIAGNOSIS — C7889 Secondary malignant neoplasm of other digestive organs: Secondary | ICD-10-CM

## 2013-11-18 DIAGNOSIS — C7800 Secondary malignant neoplasm of unspecified lung: Secondary | ICD-10-CM

## 2013-11-18 DIAGNOSIS — C649 Malignant neoplasm of unspecified kidney, except renal pelvis: Secondary | ICD-10-CM

## 2013-11-18 DIAGNOSIS — F341 Dysthymic disorder: Secondary | ICD-10-CM

## 2013-11-18 DIAGNOSIS — M79609 Pain in unspecified limb: Secondary | ICD-10-CM

## 2013-11-18 LAB — CBC WITH DIFFERENTIAL/PLATELET
BASO%: 0.2 % (ref 0.0–2.0)
Basophils Absolute: 0 10*3/uL (ref 0.0–0.1)
EOS%: 2.8 % (ref 0.0–7.0)
Eosinophils Absolute: 0.2 10*3/uL (ref 0.0–0.5)
HCT: 45.4 % (ref 38.4–49.9)
HGB: 14.9 g/dL (ref 13.0–17.1)
LYMPH%: 29.9 % (ref 14.0–49.0)
MCH: 34.6 pg — ABNORMAL HIGH (ref 27.2–33.4)
MCHC: 32.8 g/dL (ref 32.0–36.0)
MCV: 105.3 fL — ABNORMAL HIGH (ref 79.3–98.0)
MONO#: 0.4 10*3/uL (ref 0.1–0.9)
MONO%: 6.9 % (ref 0.0–14.0)
NEUT#: 3.4 10*3/uL (ref 1.5–6.5)
NEUT%: 60.2 % (ref 39.0–75.0)
Platelets: 168 10*3/uL (ref 140–400)
RBC: 4.31 10*6/uL (ref 4.20–5.82)
RDW: 13.6 % (ref 11.0–14.6)
WBC: 5.7 10*3/uL (ref 4.0–10.3)
lymph#: 1.7 10*3/uL (ref 0.9–3.3)

## 2013-11-18 LAB — COMPREHENSIVE METABOLIC PANEL (CC13)
ALT: 49 U/L (ref 0–55)
AST: 28 U/L (ref 5–34)
Albumin: 3.8 g/dL (ref 3.5–5.0)
Alkaline Phosphatase: 96 U/L (ref 40–150)
Anion Gap: 9 mEq/L (ref 3–11)
BUN: 15.7 mg/dL (ref 7.0–26.0)
CO2: 29 mEq/L (ref 22–29)
Calcium: 9.8 mg/dL (ref 8.4–10.4)
Chloride: 104 mEq/L (ref 98–109)
Creatinine: 1.3 mg/dL (ref 0.7–1.3)
Glucose: 121 mg/dl (ref 70–140)
Potassium: 4.6 mEq/L (ref 3.5–5.1)
Sodium: 142 mEq/L (ref 136–145)
Total Bilirubin: 0.51 mg/dL (ref 0.20–1.20)
Total Protein: 8 g/dL (ref 6.4–8.3)

## 2013-11-18 MED ORDER — ALPRAZOLAM 1 MG PO TABS: 1.0000 mg | ORAL_TABLET | Freq: Three times a day (TID) | ORAL | Status: DC | PRN

## 2013-11-18 MED ORDER — OXYCODONE HCL 5 MG PO TABS: 5.0000 mg | ORAL_TABLET | ORAL | Status: DC | PRN

## 2013-11-18 NOTE — Progress Notes (Signed)
Hematology and Oncology Follow Up Visit  Keith [name] [id number] [birth year] 47 y.o. 11/18/2013 9:43 AM  Keith Bring, MD  Keith [name], M.D.  Keith Bent, MD  Keith Banister, MD    Principle Diagnosis: This is a 47 year old gentleman with stage IV renal cell carcinoma diagnosed in 2009.  Prior Therapy: 1. Status post laparoscopic radical nephrectomy.  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 2. Patient status post thoracotomy done October 2009.  He had a lower lobe nodule, biopsy proven to be metastatic renal cell carcinoma.   3. Patient is status post stereotactic radiotherapy to pulmonary nodules in May of 2010. 4. He is S/P Sutent 50 mg 4 weeks on 2 weeks off from November of 2011 to 03/2013. He progressed at that time.  5. He is S/P radiation to the right sacral bone between 4/22 to 4/30.    Current therapy: He is on Votrient 800 mg since 03/2013. His disease include pancreatic involvement with RCC as well as right sacral lesion.   Interim History:  Keith [name] presents today for a followup visit.  He has tolerated Votrient very well without any new complications this month. He did report grade 1 hand-foot syndrome and mild diarrhea. He reports pain to his chest radiating around to his back is stable and not any worse.  He continue to take Cymbalta as well. Has not reported any other complications. He is not reporting any neurological symptoms. He reports new left on her pain associated with finger numbness. He is not reporting any neck pain or shoulder pain. The pain is constant and rating close to 9/10. It is interfering with his daily function and activity. He reportedly tried anti-inflammatories the last 3 weeks without any benefits. He does not report any, or any precipitating symptoms.  Medications: I have reviewed the patient's current medications.  Current Outpatient Prescriptions  Medication Sig Dispense Refill  . ALPRAZolam (XANAX) 1 MG tablet Take  1 tablet (1 mg total) by mouth every 8 (eight) hours as needed. Anxiety  50 tablet  1  . DULoxetine (CYMBALTA) 60 MG capsule Take 60 mg by mouth every morning.       . magnesium hydroxide (MILK OF MAGNESIA) 400 MG/5ML suspension Take 15 mLs by mouth daily as needed for constipation.  360 mL  0  . pazopanib (VOTRIENT) 200 MG tablet Take 4 tablets (800 mg total) by mouth every morning. Take on an empty stomach.  120 tablet  0  . oxyCODONE (OXY IR/ROXICODONE) 5 MG immediate release tablet Take 1 tablet (5 mg total) by mouth every 4 (four) hours as needed for severe pain.  30 tablet  0   No current facility-administered medications for this visit.     Allergies:  Allergies  Allergen Reactions  . Ceftriaxone Hives  . Hydrocodone Swelling    Past Medical History, Surgical history, Social history, and Family History were reviewed and updated.  Review of Systems:  Remaining ROS negative.  Physical Exam: Blood pressure 138/94, pulse 82, temperature 97.8 F (36.6 C), temperature source Oral, resp. rate 18, height 6\' 3"  (1.905 m), weight 223 lb 9.6 oz (101.424 kg). ECOG: 1 General appearance: alert Head: Normocephalic, without obvious abnormality, atraumatic Neck: no adenopathy, no carotid bruit, no JVD, supple, symmetrical, trachea midline and thyroid not enlarged, symmetric, no tenderness/mass/nodules Lymph nodes: Cervical, supraclavicular, and axillary nodes normal. Heart:regular rate and rhythm, S1, S2 normal, no murmur, click, rub or gallop Lung:chest clear, no wheezing, rales, normal symmetric  air entry. Chest wall pain is reproducible with pressure. Abdomen: soft, non-tender, without masses or organomegaly EXT:no edema, no despumation. No erythema noted. Tender to touch at the ankle and shin area on both sides.   Neuro: no focal deficits noted. No weakness noted in his LE  Lab Results: Lab Results  Component Value Date   WBC 5.7 11/18/2013   HGB 14.9 11/18/2013   HCT 45.4  11/18/2013   MCV 105.3* 11/18/2013   PLT 168 11/18/2013   Impression and Plan:  This is a pleasant 47 year old gentleman with the following issues. 1. Metastatic renal cell carcinoma.  He has documented disease to the lung and the pancreas.  He tolerating Votrient without complications. CT scan from 09/20/2013 showed mostly stable disease. The plan is to continue the current medication without any change. I will repeat his CT scan in 3 months.   2. Pancreatic thickening/mass.  This has been biopsy proven to be a renal cell metastasis. No change in treatment at this time. 3. Depression/Anxiety: He remains on Cymbalta and Xanax.  4. Constipation: improved.  5. Left arm pain: I. nuclear etiology but it is very possible that he has bony metastasis to the humerus and if indeed that's the case he might require further radiation. I will set up an MRI to evaluate that area and I have given him oxycodone for pain medication. He understands he does not have any cancer spots in the bone then I will not give him any pain medication I will refer her to orthopedics. He understands also that any abuse of his narcotic pain medication will not be tolerated. 6. Sacral mass: S/P radiation. No pain reported.  7. Follow up: 4 weeks.  [id number] 11/28/20149:43 AM

## 2013-11-24 ENCOUNTER — Other Ambulatory Visit: Payer: Self-pay | Admitting: *Deleted

## 2013-11-24 NOTE — Telephone Encounter (Signed)
THIS REFILL REQUEST FOR VOTRIENT WAS PLACED IN DR.SHADAD'S ACTIVE WORK FOLDER. 

## 2013-11-25 ENCOUNTER — Other Ambulatory Visit: Payer: Self-pay | Admitting: *Deleted

## 2013-11-25 DIAGNOSIS — C649 Malignant neoplasm of unspecified kidney, except renal pelvis: Secondary | ICD-10-CM

## 2013-11-25 MED ORDER — PAZOPANIB HCL 200 MG PO TABS: 800.0000 mg | ORAL_TABLET | Freq: Every morning | ORAL | Status: DC

## 2013-11-29 ENCOUNTER — Ambulatory Visit (HOSPITAL_COMMUNITY)
Admission: RE | Admit: 2013-11-29 | Discharge: 2013-11-29 | Disposition: A | Discharge status: Home / Self Care | Payer: BC Managed Care – PPO | Source: Ambulatory Visit | Attending: Oncology | Admitting: Oncology

## 2013-11-29 DIAGNOSIS — C801 Malignant (primary) neoplasm, unspecified: Secondary | ICD-10-CM | POA: Insufficient documentation

## 2013-11-29 DIAGNOSIS — C649 Malignant neoplasm of unspecified kidney, except renal pelvis: Secondary | ICD-10-CM | POA: Insufficient documentation

## 2013-11-29 DIAGNOSIS — M899 Disorder of bone, unspecified: Secondary | ICD-10-CM | POA: Insufficient documentation

## 2013-11-29 MED ORDER — GADOBENATE DIMEGLUMINE 529 MG/ML IV SOLN
20.0000 mL | Freq: Once | INTRAVENOUS | Status: AC | PRN
  Administered 2013-11-29: 20 mL via INTRAVENOUS

## 2013-11-30 ENCOUNTER — Telehealth: Payer: Self-pay | Admitting: *Deleted

## 2013-11-30 NOTE — Telephone Encounter (Signed)
Wife penny calling to ask if they should order another month of votrient, per [name] yes.

## 2013-12-02 NOTE — Telephone Encounter (Signed)
RECEIVED A FAX FROM [facility] CONCERNING A CONFIRMATION OF PRESCRIPTION SHIPMENT FOR VOTRIENT ON 12/01/13.

## 2013-12-09 ENCOUNTER — Telehealth: Payer: Self-pay | Admitting: Oncology

## 2013-12-09 ENCOUNTER — Encounter: Payer: Self-pay | Admitting: Medical Oncology

## 2013-12-09 NOTE — Telephone Encounter (Signed)
lmonvm advising the pt of her appts on 12/20/2013@11 :15am

## 2013-12-09 NOTE — Progress Notes (Signed)
Spouse called stating pt has no f/u appt. Reviewed with MD, POF sent to schedulers, patient to be sched for 12/20/13 labs/NP.

## 2013-12-17 IMAGING — CT CT ABD-PELV W/ CM
3 of 5 series · 12 of 36 positions shown, 18 images · IV contrast (READICAT/WATER & [ID] OMNI [id number])
Comparison: Multiple priors, most recently CT of chest 12/31/2011,
and CT of chest, abdomen and pelvis dated 11/21/2011.

CLINICAL DATA: History of left-sided renal cancer.

CT ABDOMEN AND PELVIS WITH CONTRAST
TECHNIQUE: Multidetector CT imaging of the abdomen and pelvis was
performed following the standard protocol during bolus
administration of intravenous contrast.
Contrast: 100mL OMNIPAQUE IOHEXOL 300 MG/ML IV SOLN

[Series 3: abd/pelvis with · axial · 0.76mm/px · z∈[-430,-45]mm · 8 of 99 slices shown, 13 images]
[im 11/99  soft-tissue]
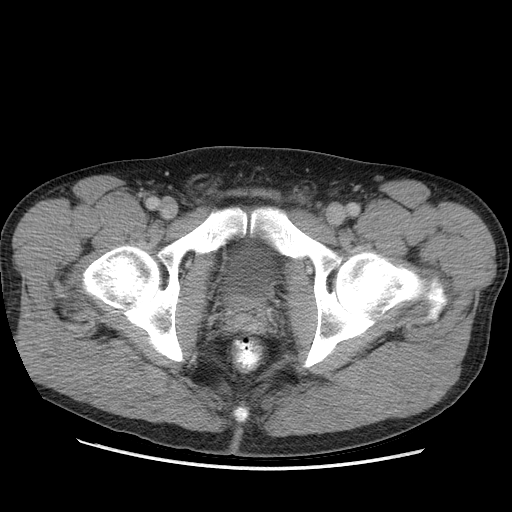
[im 11/99  bone]
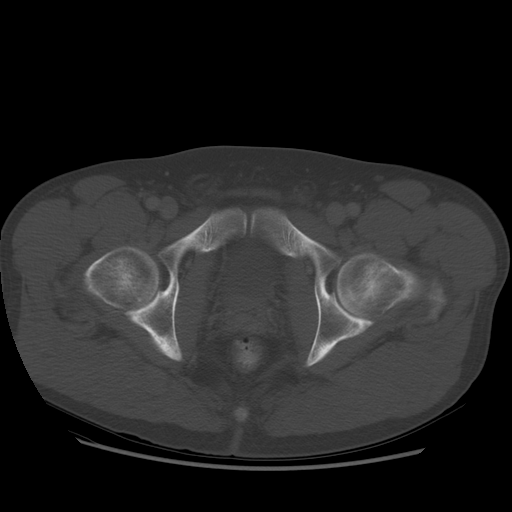
[im 22/99  soft-tissue]
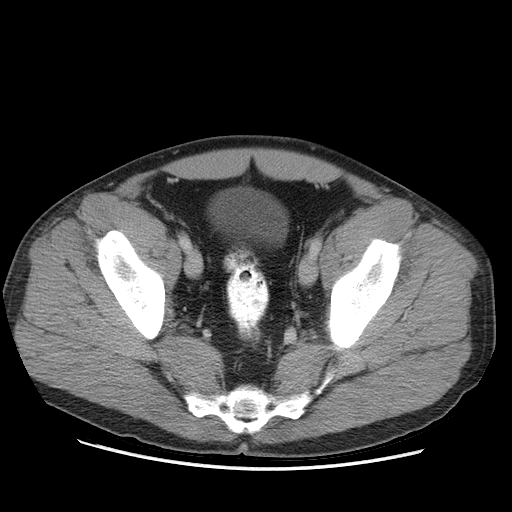
[im 33/99  soft-tissue]
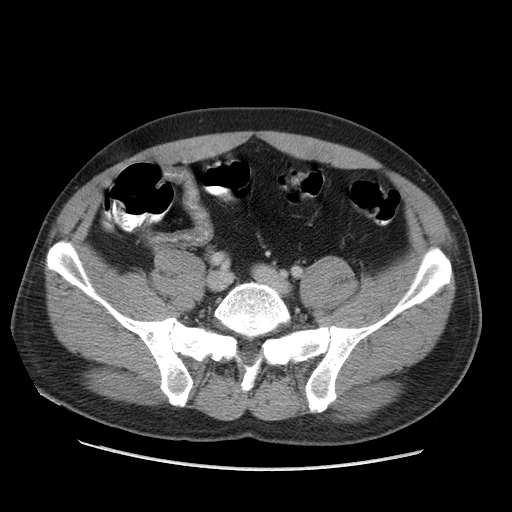
[im 44/99  soft-tissue]
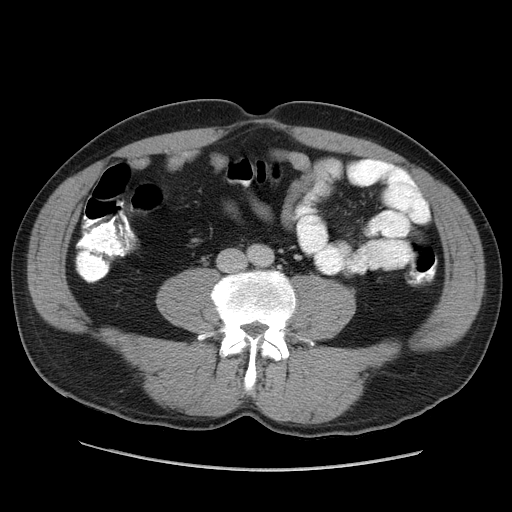
[im 55/99  soft-tissue]
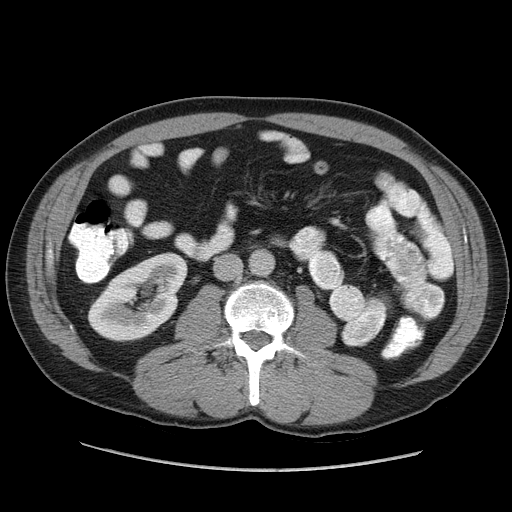
[im 55/99  lung]
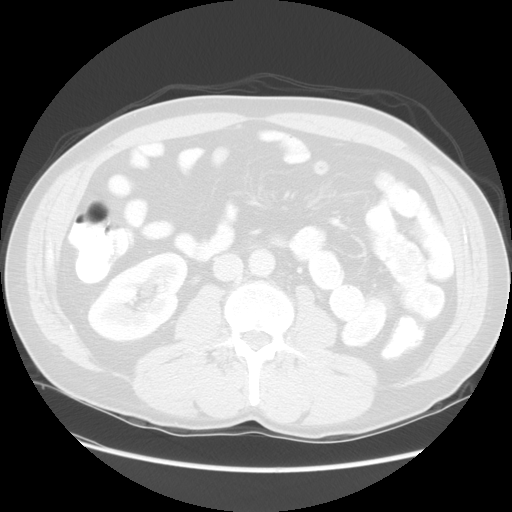
[im 66/99  soft-tissue]
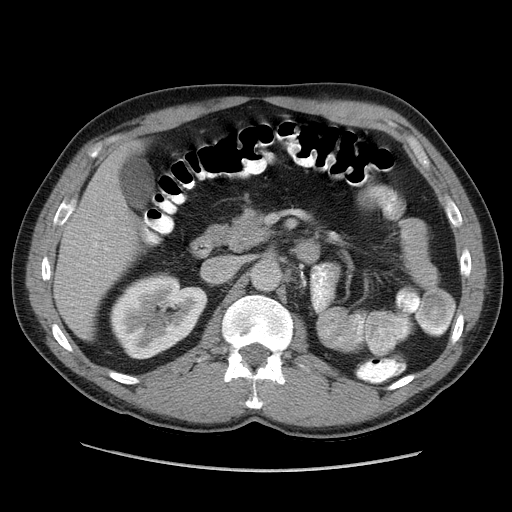
[im 66/99  lung]
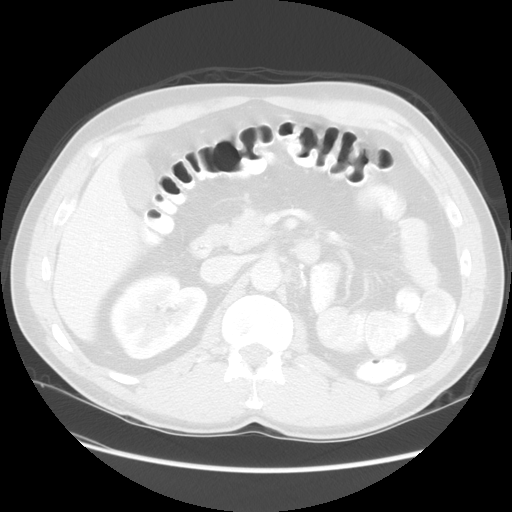
[im 77/99  soft-tissue]
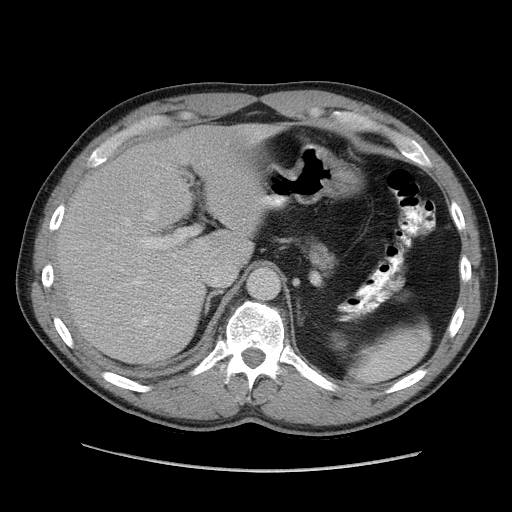
[im 77/99  lung]
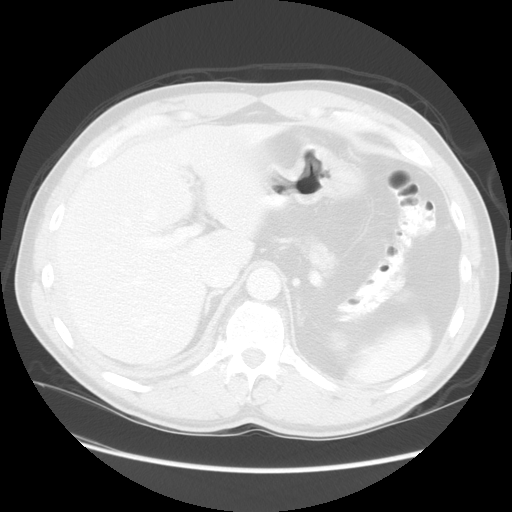
[im 88/99  soft-tissue]
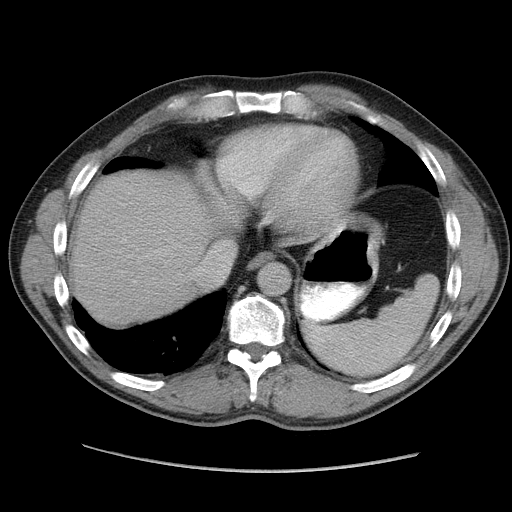
[im 88/99  lung]
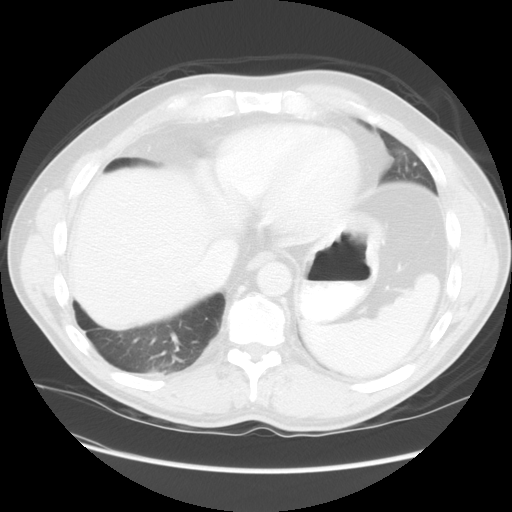

[Series 601: coronal body · coronal · 0.99mm/px · 1 of 125 slices shown, 2 images]
[im 42/125  soft-tissue]
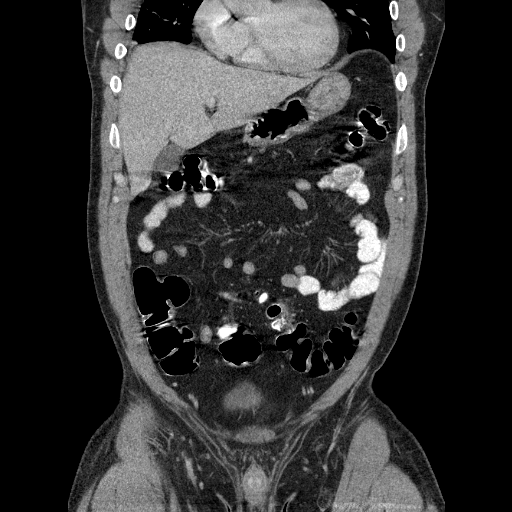
[im 42/125  bone]
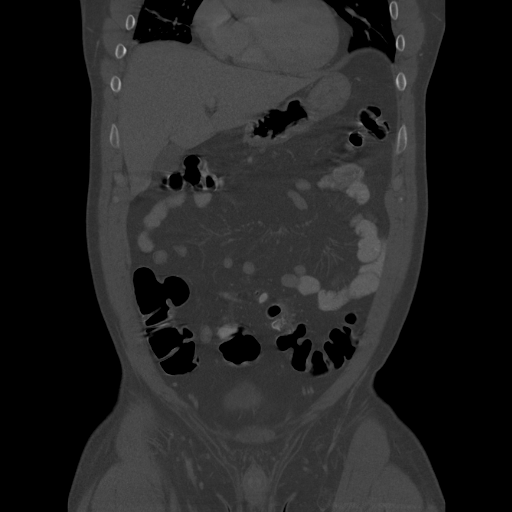

[Series 602: sagittal body · sagittal · 0.99mm/px · 3 of 157 slices shown]
[im 12/157  soft-tissue]
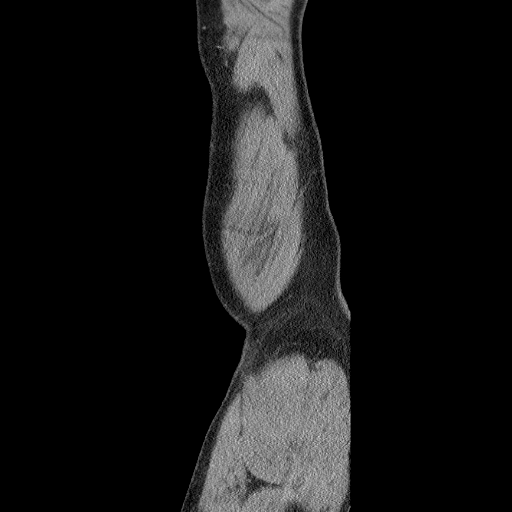
[im 34/157  soft-tissue]
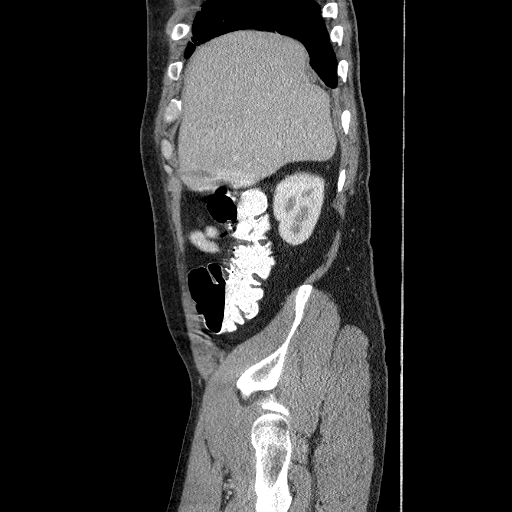
[im 56/157  soft-tissue]
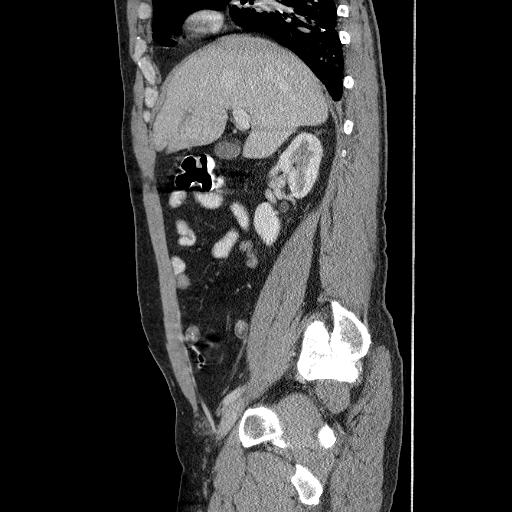

[12 of 36 positions shown; findings below may reference images not displayed]

FINDINGS: Lung Bases: Image 1 of series 4 again demonstrates a focal soft
tissue with spiculated margins at the bifurcation of medial lateral
segment bronchi to the right middle lobe, with associated pleural
retraction of both the right major and minor fissures. This appears
slightly more prominent than the prior examination, but is
difficult to measure as it is incompletely visualized and highly
irregular in appearance.  High attenuation linear structure in the
right lower lobe is consistent with a suture line likely from prior
wedge resection. Heart size is mildly enlarged.

Abdomen/Pelvis:  6 mm low attenuation lesion between segments 5 and
8 in the liver, unchanged compared to the prior examinations.  No
other new focal hepatic lesions are identified.  The enhanced
appearance of the gallbladder, spleen, bilateral adrenal glands and
right kidney is unremarkable.  Pancreatic ductal dilatation is
similar compared to prior studies, and at least one of the
previously noted hypervascular masses in the pancreas appears
similar (in the mid body best demonstrated on image 27 of series 3,
measuring approximately 1.9 x 1.5 cm).

Normal appendix.  No ascites or pneumoperitoneum and no pathologic
distension of bowel.  No definite pathologic adenopathy noted
within the abdomen or pelvis.  Urinary bladder is unremarkable in
appearance.

Musculoskeletal: There are no aggressive appearing lytic or blastic
lesions noted in the visualized portions of the skeleton.
IMPRESSION: 1. Appearance of abdomen and pelvis is very similar to recent prior
study from 11/21/2011.  At least one of the pancreatic lesions (the
largest lesion within the mid body of the pancreas) appears similar
in size measuring approximately 1.9 x 1.5 cm, again concerning for
pancreatic metastases from the patient's left renal cell carcinoma.
No definite new metastatic disease noted in the abdomen or pelvis.
2.  The lung lesion in the right middle lobe is incompletely
visualized, but does appear slightly more prominent than the prior
examination from 12/31/2011.  If there is concern for progression
of disease within the thorax, further evaluation with thoracic CT
scan may be warranted.

## 2013-12-20 ENCOUNTER — Ambulatory Visit (HOSPITAL_BASED_OUTPATIENT_CLINIC_OR_DEPARTMENT_OTHER): Payer: Self-pay | Admitting: Oncology

## 2013-12-20 ENCOUNTER — Telehealth: Payer: Self-pay | Admitting: Oncology

## 2013-12-20 ENCOUNTER — Other Ambulatory Visit (HOSPITAL_BASED_OUTPATIENT_CLINIC_OR_DEPARTMENT_OTHER): Payer: Self-pay

## 2013-12-20 ENCOUNTER — Encounter: Payer: Self-pay | Admitting: Oncology

## 2013-12-20 VITALS — BP 134/111 | HR 92 | Temp 97.9°F | Resp 20 | Ht 75.0 in | Wt 221.3 lb

## 2013-12-20 DIAGNOSIS — F411 Generalized anxiety disorder: Secondary | ICD-10-CM

## 2013-12-20 DIAGNOSIS — C649 Malignant neoplasm of unspecified kidney, except renal pelvis: Secondary | ICD-10-CM

## 2013-12-20 DIAGNOSIS — C7889 Secondary malignant neoplasm of other digestive organs: Secondary | ICD-10-CM

## 2013-12-20 DIAGNOSIS — M79609 Pain in unspecified limb: Secondary | ICD-10-CM

## 2013-12-20 DIAGNOSIS — C7800 Secondary malignant neoplasm of unspecified lung: Secondary | ICD-10-CM

## 2013-12-20 DIAGNOSIS — F419 Anxiety disorder, unspecified: Secondary | ICD-10-CM

## 2013-12-20 LAB — CBC WITH DIFFERENTIAL/PLATELET
BASO%: 0.8 % (ref 0.0–2.0)
Basophils Absolute: 0.1 10*3/uL (ref 0.0–0.1)
EOS%: 2.1 % (ref 0.0–7.0)
Eosinophils Absolute: 0.1 10*3/uL (ref 0.0–0.5)
HCT: 47.4 % (ref 38.4–49.9)
HGB: 16.3 g/dL (ref 13.0–17.1)
LYMPH%: 28.1 % (ref 14.0–49.0)
MCH: 36 pg — ABNORMAL HIGH (ref 27.2–33.4)
MCHC: 34.5 g/dL (ref 32.0–36.0)
MCV: 104.3 fL — ABNORMAL HIGH (ref 79.3–98.0)
MONO#: 0.4 10*3/uL (ref 0.1–0.9)
MONO%: 6.5 % (ref 0.0–14.0)
NEUT#: 4.1 10*3/uL (ref 1.5–6.5)
NEUT%: 62.5 % (ref 39.0–75.0)
Platelets: 185 10*3/uL (ref 140–400)
RBC: 4.54 10*6/uL (ref 4.20–5.82)
RDW: 14.2 % (ref 11.0–14.6)
WBC: 6.6 10*3/uL (ref 4.0–10.3)
lymph#: 1.8 10*3/uL (ref 0.9–3.3)

## 2013-12-20 LAB — COMPREHENSIVE METABOLIC PANEL (CC13)
ALT: 57 U/L — ABNORMAL HIGH (ref 0–55)
AST: 29 U/L (ref 5–34)
Albumin: 4.1 g/dL (ref 3.5–5.0)
Alkaline Phosphatase: 108 U/L (ref 40–150)
Anion Gap: 11 mEq/L (ref 3–11)
BUN: 12.8 mg/dL (ref 7.0–26.0)
CO2: 24 mEq/L (ref 22–29)
Calcium: 9.6 mg/dL (ref 8.4–10.4)
Chloride: 101 mEq/L (ref 98–109)
Creatinine: 1.4 mg/dL — ABNORMAL HIGH (ref 0.7–1.3)
Glucose: 143 mg/dl — ABNORMAL HIGH (ref 70–140)
Potassium: 4.5 mEq/L (ref 3.5–5.1)
Sodium: 137 mEq/L (ref 136–145)
Total Bilirubin: 0.56 mg/dL (ref 0.20–1.20)
Total Protein: 8.6 g/dL — ABNORMAL HIGH (ref 6.4–8.3)

## 2013-12-20 MED ORDER — ALPRAZOLAM 1 MG PO TABS: 1.0000 mg | ORAL_TABLET | Freq: Three times a day (TID) | ORAL | Status: DC | PRN

## 2013-12-20 NOTE — Progress Notes (Signed)
Hematology and Oncology Follow Up Visit  Keith [name] [id number] 09/10/1966 47 y.o. 12/20/2013 2:41 PM  Keith Bring, MD  Keith [name], M.D.  Keith Bent, MD  Keith Banister, MD    Principle Diagnosis: This is a 47 year old gentleman with stage IV renal cell carcinoma diagnosed in 2009.  Prior Therapy: 1. Status post laparoscopic radical nephrectomy.  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 2. Patient status post thoracotomy done October 2009.  He had a lower lobe nodule, biopsy proven to be metastatic renal cell carcinoma.   3. Patient is status post stereotactic radiotherapy to pulmonary nodules in May of 2010. 4. He is S/P Sutent 50 mg 4 weeks on 2 weeks off from November of 2011 to 03/2013. He progressed at that time.  5. He is S/P radiation to the right sacral bone between 4/22 to 4/30.    Current therapy: He is on Votrient 800 mg since 03/2013. His disease include pancreatic involvement with RCC as well as right sacral lesion.   Interim History:  Keith [name] presents today for a followup visit.  He has tolerated Votrient very well without any new complications this month. He did report grade 1 hand-foot syndrome and mild diarrhea. He reports pain to his chest radiating around to his back is stable and not any worse.  Cymbalta was helping, but he has not been able to get this refilled in about 1 week due to cost. He will have new insurance in effect on 12/22/13.  Has not reported any other complications. He is not reporting any neurological symptoms. He reports ongoing left shoulder pain associated with finger numbness. He is not reporting any neck pain. The pain is constant. It is interfering with his daily function and activity. States Oxycodone did not help.   Medications: I have reviewed the patient's current medications.  Current Outpatient Prescriptions  Medication Sig Dispense Refill  . ALPRAZolam (XANAX) 1 MG tablet Take 1 tablet (1 mg total) by  mouth every 8 (eight) hours as needed. Anxiety  50 tablet  0  . DULoxetine (CYMBALTA) 60 MG capsule Take 60 mg by mouth every morning.       . magnesium hydroxide (MILK OF MAGNESIA) 400 MG/5ML suspension Take 15 mLs by mouth daily as needed for constipation.  360 mL  0  . oxyCODONE (OXY IR/ROXICODONE) 5 MG immediate release tablet Take 1 tablet (5 mg total) by mouth every 4 (four) hours as needed for severe pain.  30 tablet  0  . pazopanib (VOTRIENT) 200 MG tablet Take 4 tablets (800 mg total) by mouth every morning. Take on an empty stomach.  120 tablet  0   No current facility-administered medications for this visit.     Allergies:  Allergies  Allergen Reactions  . Ceftriaxone Hives  . Hydrocodone Swelling    Past Medical History, Surgical history, Social history, and Family History were reviewed and updated.  Review of Systems:  Remaining ROS negative.  Physical Exam: Blood pressure 134/111, pulse 92, temperature 97.9 F (36.6 C), temperature source Oral, resp. rate 20, height 6\' 3"  (1.905 m), weight 221 lb 4.8 oz (100.381 kg). ECOG: 1 General appearance: alert Head: Normocephalic, without obvious abnormality, atraumatic Neck: no adenopathy, no carotid bruit, no JVD, supple, symmetrical, trachea midline and thyroid not enlarged, symmetric, no tenderness/mass/nodules Lymph nodes: Cervical, supraclavicular, and axillary nodes normal. Heart:regular rate and rhythm, S1, S2 normal, no murmur, click, rub or gallop Lung:chest clear, no wheezing, rales, normal symmetric  air entry. Chest wall pain is reproducible with pressure. Abdomen: soft, non-tender, without masses or organomegaly EXT:no edema, no despumation. No erythema noted. Tender to touch at the ankle and shin area on both sides.   Neuro: no focal deficits noted. No weakness noted in his LE  Lab Results: Lab Results  Component Value Date   WBC 6.6 12/20/2013   HGB 16.3 12/20/2013   HCT 47.4 12/20/2013   MCV 104.3*  12/20/2013   PLT 185 12/20/2013   Imaging:  CLINICAL DATA: History renal cell carcinoma. Widespread metastatic  disease. Patient complaining of mid shaft left humeral pain.  EXAM:  MRI OF THE LEFT HUMERUS WITHOUT AND WITH CONTRAST  TECHNIQUE:  Multiplanar, multisequence MR imaging was performed both before and  after administration of intravenous contrast.  CONTRAST: 51mL MULTIHANCE GADOBENATE DIMEGLUMINE 529 MG/ML IV SOLN  COMPARISON: None.  FINDINGS:  There is a 6.7 mm T1 hypo intense lesion (series 4/image 9) in the  proximal humeral meta diaphysis with enhancement on postcontrast  images (series 12/ image 14). There is no periosteal reaction.  There is no other marrow signal abnormality within the IT left  humerus. The muscles are normal in signal. There is no enhancing  soft tissue mass. There is no fluid collection.  There is no fracture. There is no dislocation.  IMPRESSION:  There is a 6.7 mm lesion in the proximal humeral metadiaphysis  concerning for metastatic disease given the patient's history of  renal cell carcinoma.  Electronically Signed  By: Kathreen Devoid  On: 11/29/2013 14:29   Impression and Plan:  This is a pleasant 47 year old gentleman with the following issues. 1. Metastatic renal cell carcinoma.  He has documented disease to the lung and the pancreas.  He tolerating Votrient without complications. CT scan from 09/20/2013 showed mostly stable disease. The plan is to continue the current medication without any change. Repeat CT scan next month.   2. Pancreatic thickening/mass.  This has been biopsy proven to be a renal cell metastasis. No change in treatment at this time. 3. Depression/Anxiety: He remains on Xanax. He will see his PCP later today to see if he can get samples of Cymbalta until his insurance renews.  4. Constipation: improved.  5. Left arm pain: MRI of the left humerus shows a very small lesion. This is not likely the cause of his pain.  Oxycodone did not help.  Will continue to monitor this. He was instructed to contact us if his pain worsens and we may consider a referral to radiation oncology at that time. 6. Sacral mass: S/P radiation. No pain reported.  7. Follow up: 4 weeks.  [facility] [place]    Patient seen and examined today and he continues to report fatigue pain symptoms predominantly in his left arm associated with some occasional numbness. He is not reporting any neck pain or shoulder pain. He is able to perform some activities of daily living at this time. Physical examination did not reveal any neurological deficits or any focal findings. The plan will be at this point is to continue on the current treatment schedule and that we will repeat imaging studies before his next CT scan.  [facility] MD 12/20/2013    12/30/20142:41 PM

## 2013-12-20 NOTE — Telephone Encounter (Signed)
gv and printed appt sched and avs for pt for Jan and Feb 2015   gv pt barium

## 2013-12-27 ENCOUNTER — Other Ambulatory Visit: Payer: Self-pay | Admitting: *Deleted

## 2013-12-27 DIAGNOSIS — C649 Malignant neoplasm of unspecified kidney, except renal pelvis: Secondary | ICD-10-CM

## 2013-12-27 MED ORDER — PAZOPANIB HCL 200 MG PO TABS: 800.0000 mg | ORAL_TABLET | Freq: Every morning | ORAL | Status: DC

## 2013-12-27 NOTE — Telephone Encounter (Signed)
THIS REFILL REQUEST FOR VOTRIENT WAS PLACED IN DR.SHADAD'S ACTIVE WORK FOLDER.

## 2013-12-28 NOTE — Telephone Encounter (Signed)
Faxed notification from [facility] that script for Votrient has been received and in insurance verification process.

## 2014-01-04 ENCOUNTER — Encounter: Payer: Self-pay | Admitting: Oncology

## 2014-01-04 NOTE — Progress Notes (Signed)
Per [facility] patient's votrient copay is $500, which he states he cannot afford.  He will send me his proof of income so I can apply for assistance through the drug company.

## 2014-01-10 NOTE — Progress Notes (Signed)
RECEIVED A FAX FROM [facility] CONCERNING A CONFIRMATION OF PRESCRIPTION SHIPMENT FOR VOTRIENT ON 01/09/14.

## 2014-01-20 ENCOUNTER — Other Ambulatory Visit: Payer: Self-pay | Admitting: Oncology

## 2014-01-20 ENCOUNTER — Ambulatory Visit (HOSPITAL_COMMUNITY)
Admission: RE | Admit: 2014-01-20 | Discharge: 2014-01-20 | Disposition: A | Discharge status: Home / Self Care | Payer: BC Managed Care – PPO | Source: Ambulatory Visit | Attending: Oncology | Admitting: Oncology

## 2014-01-20 ENCOUNTER — Other Ambulatory Visit (HOSPITAL_BASED_OUTPATIENT_CLINIC_OR_DEPARTMENT_OTHER): Payer: BC Managed Care – PPO

## 2014-01-20 ENCOUNTER — Encounter (HOSPITAL_COMMUNITY): Payer: Self-pay

## 2014-01-20 DIAGNOSIS — C7800 Secondary malignant neoplasm of unspecified lung: Secondary | ICD-10-CM | POA: Insufficient documentation

## 2014-01-20 DIAGNOSIS — C7952 Secondary malignant neoplasm of bone marrow: Secondary | ICD-10-CM

## 2014-01-20 DIAGNOSIS — J438 Other emphysema: Secondary | ICD-10-CM | POA: Insufficient documentation

## 2014-01-20 DIAGNOSIS — C649 Malignant neoplasm of unspecified kidney, except renal pelvis: Secondary | ICD-10-CM

## 2014-01-20 DIAGNOSIS — C7889 Secondary malignant neoplasm of other digestive organs: Secondary | ICD-10-CM | POA: Insufficient documentation

## 2014-01-20 DIAGNOSIS — K838 Other specified diseases of biliary tract: Secondary | ICD-10-CM | POA: Insufficient documentation

## 2014-01-20 DIAGNOSIS — Z905 Acquired absence of kidney: Secondary | ICD-10-CM | POA: Insufficient documentation

## 2014-01-20 DIAGNOSIS — C7951 Secondary malignant neoplasm of bone: Secondary | ICD-10-CM | POA: Insufficient documentation

## 2014-01-20 LAB — CBC WITH DIFFERENTIAL/PLATELET
BASO%: 1 % (ref 0.0–2.0)
Basophils Absolute: 0.1 10*3/uL (ref 0.0–0.1)
EOS%: 2.9 % (ref 0.0–7.0)
Eosinophils Absolute: 0.2 10*3/uL (ref 0.0–0.5)
HCT: 45.6 % (ref 38.4–49.9)
HGB: 15.6 g/dL (ref 13.0–17.1)
LYMPH%: 27.3 % (ref 14.0–49.0)
MCH: 35.9 pg — ABNORMAL HIGH (ref 27.2–33.4)
MCHC: 34.1 g/dL (ref 32.0–36.0)
MCV: 105 fL — ABNORMAL HIGH (ref 79.3–98.0)
MONO#: 0.4 10*3/uL (ref 0.1–0.9)
MONO%: 6.3 % (ref 0.0–14.0)
NEUT#: 4.4 10*3/uL (ref 1.5–6.5)
NEUT%: 62.5 % (ref 39.0–75.0)
Platelets: 188 10*3/uL (ref 140–400)
RBC: 4.34 10*6/uL (ref 4.20–5.82)
RDW: 14.8 % — ABNORMAL HIGH (ref 11.0–14.6)
WBC: 7.1 10*3/uL (ref 4.0–10.3)
lymph#: 1.9 10*3/uL (ref 0.9–3.3)

## 2014-01-20 LAB — COMPREHENSIVE METABOLIC PANEL (CC13)
ALT: 59 U/L — ABNORMAL HIGH (ref 0–55)
AST: 28 U/L (ref 5–34)
Albumin: 3.8 g/dL (ref 3.5–5.0)
Alkaline Phosphatase: 96 U/L (ref 40–150)
Anion Gap: 11 meq/L (ref 3–11)
BUN: 12.3 mg/dL (ref 7.0–26.0)
CO2: 26 meq/L (ref 22–29)
Calcium: 9.5 mg/dL (ref 8.4–10.4)
Chloride: 103 meq/L (ref 98–109)
Creatinine: 1.3 mg/dL (ref 0.7–1.3)
Glucose: 196 mg/dL — ABNORMAL HIGH (ref 70–140)
Potassium: 4.8 meq/L (ref 3.5–5.1)
Sodium: 140 meq/L (ref 136–145)
Total Bilirubin: 0.48 mg/dL (ref 0.20–1.20)
Total Protein: 7.6 g/dL (ref 6.4–8.3)

## 2014-01-20 MED ORDER — IOHEXOL 300 MG/ML  SOLN
100.0000 mL | Freq: Once | INTRAMUSCULAR | Status: AC | PRN
  Administered 2014-01-20: 100 mL via INTRAVENOUS

## 2014-01-27 ENCOUNTER — Telehealth: Payer: Self-pay | Admitting: Oncology

## 2014-01-27 ENCOUNTER — Encounter: Payer: Self-pay | Admitting: Oncology

## 2014-01-27 ENCOUNTER — Ambulatory Visit (HOSPITAL_BASED_OUTPATIENT_CLINIC_OR_DEPARTMENT_OTHER): Payer: BC Managed Care – PPO | Admitting: Oncology

## 2014-01-27 VITALS — BP 151/104 | HR 79 | Temp 97.1°F | Resp 20 | Ht 75.0 in | Wt 222.9 lb

## 2014-01-27 DIAGNOSIS — M79609 Pain in unspecified limb: Secondary | ICD-10-CM

## 2014-01-27 DIAGNOSIS — F3289 Other specified depressive episodes: Secondary | ICD-10-CM

## 2014-01-27 DIAGNOSIS — C649 Malignant neoplasm of unspecified kidney, except renal pelvis: Secondary | ICD-10-CM

## 2014-01-27 DIAGNOSIS — C7889 Secondary malignant neoplasm of other digestive organs: Secondary | ICD-10-CM

## 2014-01-27 DIAGNOSIS — F411 Generalized anxiety disorder: Secondary | ICD-10-CM

## 2014-01-27 DIAGNOSIS — C7800 Secondary malignant neoplasm of unspecified lung: Secondary | ICD-10-CM

## 2014-01-27 DIAGNOSIS — F329 Major depressive disorder, single episode, unspecified: Secondary | ICD-10-CM

## 2014-01-27 MED ORDER — ALPRAZOLAM 1 MG PO TABS: 1.0000 mg | ORAL_TABLET | Freq: Three times a day (TID) | ORAL | Status: DC | PRN

## 2014-01-27 NOTE — Telephone Encounter (Signed)
gave pt appt for lab and MD for 3/11

## 2014-01-27 NOTE — Progress Notes (Signed)
Hematology and Oncology Follow Up Visit  Keith [name] [id number] 1966-12-19 48 y.o. 01/27/2014 10:57 AM  Keith Bring, MD  Keith [name], M.D.  Keith Bent, MD  Keith Banister, MD    Principle Diagnosis: This is a 48 year old gentleman with stage IV renal cell carcinoma diagnosed in 2009.  Prior Therapy: 1. Status post laparoscopic radical nephrectomy.  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 2. Patient status post thoracotomy done October 2009.  He had a lower lobe nodule, biopsy proven to be metastatic renal cell carcinoma.   3. Patient is status post stereotactic radiotherapy to pulmonary nodules in May of 2010. 4. He is S/P Sutent 50 mg 4 weeks on 2 weeks off from November of 2011 to 03/2013. He progressed at that time.  5. He is S/P radiation to the right sacral bone between 4/22 to 4/30.    Current therapy: He is on Votrient 800 mg since 03/2013. His disease include pancreatic involvement with RCC as well as right sacral lesion.   Interim History:  Keith [name] presents today for a followup visit.  He has tolerated Votrient very well without any new complications this month. He did report grade 1 hand-foot syndrome and mild diarrhea. He reports pain that is rather diffuse in nature and not very specific.  He is currently back on Cymbalta and feel that have might have helped his mood and diffuse pain.  Has not reported any other complications. He is not reporting any neurological symptoms. He reports ongoing left shoulder pain associated with finger numbness. He is not reporting any neck pain. The pain is constant. It is interfering with his daily function and activity. He continues to report occasional pain in her left arm associated with numbness.  Medications: I have reviewed the patient's current medications.  Current Outpatient Prescriptions  Medication Sig Dispense Refill  . ALPRAZolam (XANAX) 1 MG tablet Take 1 tablet (1 mg total) by mouth every 8  (eight) hours as needed. Anxiety  50 tablet  0  . DULoxetine (CYMBALTA) 60 MG capsule Take 60 mg by mouth every morning.       . magnesium hydroxide (MILK OF MAGNESIA) 400 MG/5ML suspension Take 15 mLs by mouth daily as needed for constipation.  360 mL  0  . oxyCODONE (OXY IR/ROXICODONE) 5 MG immediate release tablet Take 1 tablet (5 mg total) by mouth every 4 (four) hours as needed for severe pain.  30 tablet  0  . pazopanib (VOTRIENT) 200 MG tablet Take 4 tablets (800 mg total) by mouth every morning. Take on an empty stomach.  120 tablet  0   No current facility-administered medications for this visit.     Allergies:  Allergies  Allergen Reactions  . Ceftriaxone Hives  . Hydrocodone Swelling    Past Medical History, Surgical history, Social history, and Family History were reviewed and updated.  Review of Systems:  Remaining ROS negative.  Physical Exam: Blood pressure 151/104, pulse 79, temperature 97.1 F (36.2 C), temperature source Oral, resp. rate 20, height 6\' 3"  (1.905 m), weight 222 lb 14.4 oz (101.107 kg), SpO2 98.00%. ECOG: 1 General appearance: alert Head: Normocephalic, without obvious abnormality, atraumatic Neck: no adenopathy, no carotid bruit, no JVD, supple, symmetrical, trachea midline and thyroid not enlarged, symmetric, no tenderness/mass/nodules Lymph nodes: Cervical, supraclavicular, and axillary nodes normal. Heart:regular rate and rhythm, S1, S2 normal, no murmur, click, rub or gallop Lung:chest clear, no wheezing, rales, normal symmetric air entry. Chest wall pain is  reproducible with pressure. Abdomen: soft, non-tender, without masses or organomegaly EXT:no edema, no despumation. No erythema noted. Tender to touch at the ankle and shin area on both sides.   Neuro: no focal deficits noted. No weakness noted in his LE  Lab Results: Lab Results  Component Value Date   WBC 7.1 01/20/2014   HGB 15.6 01/20/2014   HCT 45.6 01/20/2014   MCV 105.0*  01/20/2014   PLT 188 01/20/2014    CLINICAL DATA: Stage IV renal cell carcinoma. By report, there is  marrow metastatic disease to bone, lungs, and pancreas.  EXAM:  CT CHEST WITH CONTRAST  CT ABDOMEN WITHOUT AND WITH CONTRAST  TECHNIQUE:  Multidetector CT imaging of the abdomen was performed without  intravenous contrast. Multidetector CT imaging of the chest and  abdomen was then performed during bolus administration of  intravenous contrast.  CONTRAST: 151mL OMNIPAQUE IOHEXOL 300 MG/ML SOLN  COMPARISON: 09/20/2013  FINDINGS:  CT CHEST FINDINGS  There is no axillary lymphadenopathy. No mediastinal or left hilar  lymphadenopathy. The the small area of abnormal soft tissue  attenuation in the inferior right hilum is stable to slightly  decreased in the interval. Heart size is normal. No pericardial or  pleural effusion.  Lung windows show emphysema. Linear band of pleuro parenchymal  opacity in the right apex is stable. Previously measured anterior  right parahilar lesion is 1.7 x 2.1 cm today compared to 1.7 x 2.0  cm previously. Architectural distortion in the right lung is stable.  No pulmonary parenchymal nodule or mass identified in the left lung.  Bone windows reveal no worrisome lytic or sclerotic osseous lesions.  CT ABDOMEN FINDINGS  Pre contrast imaging shows no right renal stone disease. Imaging  after IV contrast administration shows no abnormality in the liver  or spleen. The stomach, duodenum, gallbladder, and adrenal glands  are unremarkable. Left kidney is surgically absent. Small  low-density lesion in the inferior pole of the right kidney is  unchanged and may enhance slightly.  Multiple hypervascular pancreatic lesions are identified. The  dominant lesion is again identified in the mid pancreas near the  junction of the body and tail. This measures 1.7 x 3.0 cm today,  slightly increased in the interval since the previous study and  clearly increased since  11/21/2011 when it measured 1.4 x 1.8 cm.  Other hypervascular lesions are seen in the head of the pancreas,  also progressed slightly since the most recent comparison study and  more obviously progressed when comparing back to 11/21/2011.  No abdominal aortic aneurysm. There is no free fluid or  lymphadenopathy in the abdomen.  No worrisome lytic or sclerotic osseous abnormality is identified.  Imaging today does not include the sacrum where the bony lesion was  identified on the previous study.  IMPRESSION:  Stable CT scan of the chest. Specifically, the right apical  pleural-parenchymal opacity in the right anterior and inferior  parahilar changes are stable.  Status post left nephrectomy with no evidence for recurrence in the  surgical bed.  Continued progression of multiple hypervascular pancreatic lesions  with associated pancreatic ductal dilatation.  Electronically Signed  By: Misty Stanley M.D.  On: 01/20/2014 11:18  Impression and Plan:  This is a pleasant 48 year old gentleman with the following issues. 1. Metastatic renal cell carcinoma.  He has documented disease to the lung and the pancreas.  He tolerating Votrient without complications. CT scan from 01/20/2014 was reviewed today showed mostly stable disease. His pancreatic lesion are  slightly increased but still does not warrant changing his medication. The plan is to continue the current medication without any change. Repeat CT scan in 3 months 2. Pancreatic thickening/mass.  This has been biopsy proven to be a renal cell metastasis. No change in treatment at this time. 3. Depression/Anxiety: He remains on Xanax and Cymbalta. 4. Left arm pain: MRI of the left humerus shows a very small lesion. This is not likely the cause of his pain. I will repeat an MRI next month I also instructed with to contact us if his pain worsens and we may consider a referral to radiation oncology at that time. 5. Sacral mass: S/P radiation. No  pain reported.  6. Follow up: 4 weeks.  [place]

## 2014-02-01 ENCOUNTER — Other Ambulatory Visit: Payer: Self-pay | Admitting: *Deleted

## 2014-02-01 DIAGNOSIS — C649 Malignant neoplasm of unspecified kidney, except renal pelvis: Secondary | ICD-10-CM

## 2014-02-01 MED ORDER — PAZOPANIB HCL 200 MG PO TABS: 800.0000 mg | ORAL_TABLET | Freq: Every morning | ORAL | Status: DC

## 2014-02-01 NOTE — Telephone Encounter (Signed)
Fax confirmation from [facility] that refill request received and will make arrangements for shipment with patient.

## 2014-02-06 NOTE — Telephone Encounter (Signed)
RECEIVED A FAX FROM [facility] CONCERNING A CONFIRMATION OF PRESCRIPTION SHIPMENT FOR VOTRIENT ON 02/03/14.

## 2014-02-23 ENCOUNTER — Other Ambulatory Visit: Payer: Self-pay | Admitting: Medical Oncology

## 2014-02-23 ENCOUNTER — Encounter: Payer: Self-pay | Admitting: Medical Oncology

## 2014-02-23 DIAGNOSIS — C649 Malignant neoplasm of unspecified kidney, except renal pelvis: Secondary | ICD-10-CM

## 2014-02-23 MED ORDER — ALPRAZOLAM 1 MG PO TABS: 1.0000 mg | ORAL_TABLET | Freq: Three times a day (TID) | ORAL | Status: DC | PRN

## 2014-02-23 NOTE — Telephone Encounter (Signed)
Patient requested for refill of xanax, per MD ok to refill. Patient also requested to have lab appt moved from Monday 03/09 am to 03/06 am. Call to patient of appt change as he requested as well as prescription ready for p.u. Patient expressed thanks, no further questions at this time.

## 2014-02-24 ENCOUNTER — Other Ambulatory Visit (HOSPITAL_BASED_OUTPATIENT_CLINIC_OR_DEPARTMENT_OTHER): Payer: BC Managed Care – PPO

## 2014-02-24 DIAGNOSIS — C649 Malignant neoplasm of unspecified kidney, except renal pelvis: Secondary | ICD-10-CM

## 2014-02-24 LAB — CBC WITH DIFFERENTIAL/PLATELET
BASO%: 0.7 % (ref 0.0–2.0)
Basophils Absolute: 0 10*3/uL (ref 0.0–0.1)
EOS%: 1.5 % (ref 0.0–7.0)
Eosinophils Absolute: 0.1 10*3/uL (ref 0.0–0.5)
HCT: 46.2 % (ref 38.4–49.9)
HGB: 15.4 g/dL (ref 13.0–17.1)
LYMPH%: 30.5 % (ref 14.0–49.0)
MCH: 35.7 pg — ABNORMAL HIGH (ref 27.2–33.4)
MCHC: 33.4 g/dL (ref 32.0–36.0)
MCV: 107 fL — ABNORMAL HIGH (ref 79.3–98.0)
MONO#: 0.4 10*3/uL (ref 0.1–0.9)
MONO%: 5.7 % (ref 0.0–14.0)
NEUT#: 4.3 10*3/uL (ref 1.5–6.5)
NEUT%: 61.6 % (ref 39.0–75.0)
Platelets: 161 10*3/uL (ref 140–400)
RBC: 4.32 10*6/uL (ref 4.20–5.82)
RDW: 15.5 % — ABNORMAL HIGH (ref 11.0–14.6)
WBC: 6.9 10*3/uL (ref 4.0–10.3)
lymph#: 2.1 10*3/uL (ref 0.9–3.3)

## 2014-02-24 LAB — COMPREHENSIVE METABOLIC PANEL (CC13)
ALT: 51 U/L (ref 0–55)
AST: 28 U/L (ref 5–34)
Albumin: 4 g/dL (ref 3.5–5.0)
Alkaline Phosphatase: 99 U/L (ref 40–150)
Anion Gap: 9 mEq/L (ref 3–11)
BUN: 8.6 mg/dL (ref 7.0–26.0)
CO2: 28 mEq/L (ref 22–29)
Calcium: 9.6 mg/dL (ref 8.4–10.4)
Chloride: 103 mEq/L (ref 98–109)
Creatinine: 1.2 mg/dL (ref 0.7–1.3)
Glucose: 135 mg/dl (ref 70–140)
Potassium: 4.5 mEq/L (ref 3.5–5.1)
Sodium: 141 mEq/L (ref 136–145)
Total Bilirubin: 0.53 mg/dL (ref 0.20–1.20)
Total Protein: 7.8 g/dL (ref 6.4–8.3)

## 2014-02-27 ENCOUNTER — Other Ambulatory Visit: Payer: BC Managed Care – PPO

## 2014-02-27 ENCOUNTER — Ambulatory Visit (HOSPITAL_COMMUNITY)
Admission: RE | Admit: 2014-02-27 | Discharge: 2014-02-27 | Disposition: A | Discharge status: Home / Self Care | Payer: BC Managed Care – PPO | Source: Ambulatory Visit | Attending: Oncology | Admitting: Oncology

## 2014-02-27 DIAGNOSIS — C649 Malignant neoplasm of unspecified kidney, except renal pelvis: Secondary | ICD-10-CM | POA: Insufficient documentation

## 2014-02-27 DIAGNOSIS — M79609 Pain in unspecified limb: Secondary | ICD-10-CM | POA: Insufficient documentation

## 2014-02-27 DIAGNOSIS — M899 Disorder of bone, unspecified: Secondary | ICD-10-CM | POA: Insufficient documentation

## 2014-02-27 DIAGNOSIS — M949 Disorder of cartilage, unspecified: Secondary | ICD-10-CM

## 2014-02-27 MED ORDER — GADOBENATE DIMEGLUMINE 529 MG/ML IV SOLN
20.0000 mL | Freq: Once | INTRAVENOUS | Status: AC | PRN
  Administered 2014-02-27: 20 mL via INTRAVENOUS

## 2014-02-28 ENCOUNTER — Other Ambulatory Visit (HOSPITAL_COMMUNITY): Payer: BC Managed Care – PPO

## 2014-03-01 ENCOUNTER — Other Ambulatory Visit: Payer: BC Managed Care – PPO

## 2014-03-01 ENCOUNTER — Encounter: Payer: Self-pay | Admitting: Oncology

## 2014-03-01 ENCOUNTER — Ambulatory Visit (HOSPITAL_BASED_OUTPATIENT_CLINIC_OR_DEPARTMENT_OTHER): Payer: BC Managed Care – PPO | Admitting: Oncology

## 2014-03-01 ENCOUNTER — Telehealth: Payer: Self-pay | Admitting: Oncology

## 2014-03-01 VITALS — BP 135/105 | HR 94 | Temp 97.3°F | Resp 18 | Ht 75.0 in | Wt 217.7 lb

## 2014-03-01 DIAGNOSIS — C7800 Secondary malignant neoplasm of unspecified lung: Secondary | ICD-10-CM

## 2014-03-01 DIAGNOSIS — C649 Malignant neoplasm of unspecified kidney, except renal pelvis: Secondary | ICD-10-CM

## 2014-03-01 DIAGNOSIS — M79609 Pain in unspecified limb: Secondary | ICD-10-CM

## 2014-03-01 DIAGNOSIS — C7889 Secondary malignant neoplasm of other digestive organs: Secondary | ICD-10-CM

## 2014-03-01 DIAGNOSIS — D492 Neoplasm of unspecified behavior of bone, soft tissue, and skin: Secondary | ICD-10-CM

## 2014-03-01 DIAGNOSIS — F341 Dysthymic disorder: Secondary | ICD-10-CM

## 2014-03-01 NOTE — Telephone Encounter (Signed)
gv adn printed appt sched and avs for pt for March adn April.Marland KitchenMarland KitchenMarland KitchenMarland Kitchenpt sched to see Dr. Tammi Klippel on 3.23.15@ 2:30

## 2014-03-01 NOTE — Progress Notes (Signed)
Hematology and Oncology Follow Up Visit  Keith [name] [id number] 06/30/1966 48 y.o. 03/01/2014 4:20 PM  Keith Bring, MD  Keith [name], M.D.  Keith Bent, MD  Keith Banister, MD    Principle Diagnosis: This is a 48 year old gentleman with stage IV renal cell carcinoma diagnosed in 2009.  Prior Therapy: 1. Status post laparoscopic radical nephrectomy.  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 2. Patient status post thoracotomy done October 2009.  He had a lower lobe nodule, biopsy proven to be metastatic renal cell carcinoma.   3. Patient is status post stereotactic radiotherapy to pulmonary nodules in May of 2010. 4. He is S/P Sutent 50 mg 4 weeks on 2 weeks off from November of 2011 to 03/2013. He progressed at that time.  5. He is S/P radiation to the right sacral bone between 4/22 to 4/30.    Current therapy: He is on Votrient 800 mg since 03/2013. His disease include pancreatic involvement with RCC as well as right sacral lesion.   Interim History:  Keith [name] presents today for a followup visit.  He has tolerated Votrient very well without any new complications this month. He did report grade 1 hand-foot syndrome and mild diarrhea. He reports pain that is rather diffuse in nature and not very specific.  He is currently back on Cymbalta and feel that have might have helped his mood and diffuse pain.  Has not reported any other complications. He is not reporting any neurological symptoms. He reports ongoing left arm pain which also has not changed. He is not reporting any neck pain. The pain is constant. It is interfering with his daily function and activity. Has not reported any recent hospitalizations or illnesses.  Medications: I have reviewed the patient's current medications.  Current Outpatient Prescriptions  Medication Sig Dispense Refill  . ALPRAZolam (XANAX) 1 MG tablet Take 1 tablet (1 mg total) by mouth every 8 (eight) hours as needed. Anxiety  50  tablet  0  . DULoxetine (CYMBALTA) 60 MG capsule Take 60 mg by mouth every morning.       . magnesium hydroxide (MILK OF MAGNESIA) 400 MG/5ML suspension Take 15 mLs by mouth daily as needed for constipation.  360 mL  0  . oxyCODONE (OXY IR/ROXICODONE) 5 MG immediate release tablet Take 1 tablet (5 mg total) by mouth every 4 (four) hours as needed for severe pain.  30 tablet  0  . pazopanib (VOTRIENT) 200 MG tablet Take 4 tablets (800 mg total) by mouth every morning. Take on an empty stomach.  120 tablet  0   No current facility-administered medications for this visit.     Allergies:  Allergies  Allergen Reactions  . Ceftriaxone Hives  . Hydrocodone Swelling    Past Medical History, Surgical history, Social history, and Family History were reviewed and updated.  Review of Systems:  Remaining ROS negative.  Physical Exam: Blood pressure 135/105, pulse 94, temperature 97.3 F (36.3 C), temperature source Oral, resp. rate 18, height 6\' 3"  (1.905 m), weight 217 lb 11.2 oz (98.748 kg), SpO2 94.00%. ECOG: 1 General appearance: alert Head: Normocephalic, without obvious abnormality, atraumatic Neck: no adenopathy, no carotid bruit, no JVD, supple, symmetrical, trachea midline and thyroid not enlarged, symmetric, no tenderness/mass/nodules Lymph nodes: Cervical, supraclavicular, and axillary nodes normal. Heart:regular rate and rhythm, S1, S2 normal, no murmur, click, rub or gallop Lung:chest clear, no wheezing, rales, normal symmetric air entry. Chest wall pain is reproducible with pressure. Abdomen:  soft, non-tender, without masses or organomegaly EXT:no edema, no despumation. No erythema noted. Tender to touch at the ankle and shin area on both sides.   Neuro: no focal deficits noted. No weakness noted in his LE  Lab Results: Lab Results  Component Value Date   WBC 6.9 02/24/2014   HGB 15.4 02/24/2014   HCT 46.2 02/24/2014   MCV 107.0* 02/24/2014   PLT 161 02/24/2014     Impression  and Plan:  This is a pleasant 48 year old gentleman with the following issues. 1. Metastatic renal cell carcinoma.  He has documented disease to the lung and the pancreas.  He tolerating Votrient without complications. CT scan from 01/20/2014 was reviewed today showed mostly stable disease. His pancreatic lesion are slightly increased but still does not warrant changing his medication. The plan is to continue the current medication without any change. Repeat CT scan in 2 months 2. Pancreatic thickening/mass.  This has been biopsy proven to be a renal cell metastasis. No change in treatment at this time. 3. Depression/Anxiety: He remains on Xanax and Cymbalta. 4. Left arm pain: MRI of the left humerus showed a slightly enlarged lesion based on the MRI that was done on 02/27/2014 which was reviewed with the patient today. I will refer him to radiation oncology for possible evaluation for palliative radiation treatment. 5. Sacral mass: S/P radiation. No pain reported.  6. Follow up: 4 weeks.  [place]

## 2014-03-03 ENCOUNTER — Other Ambulatory Visit: Payer: Self-pay | Admitting: *Deleted

## 2014-03-03 NOTE — Telephone Encounter (Signed)
THIS REFILL REQUEST FOR VOTRIENT WAS PLACED IN DR.SHADAD'S ACTIVE WORK FOLDER.

## 2014-03-04 IMAGING — CT CT ABD-PELV W/ CM
2 of 4 series · 16 of 46 positions shown, 18 images · IV contrast (omnipaque)
Comparison: Abdomen pelvis CT on 02/13/2012 and chest CTA on
12/31/2011

CT CHEST

CLINICAL DATA: Follow-up metastatic renal cell carcinoma.
Previous left nephrectomy and radiation therapy.  Ongoing oral
chemotherapy.

CT CHEST, ABDOMEN AND PELVIS WITH CONTRAST
TECHNIQUE: Multidetector CT imaging of the chest, abdomen and
pelvis was performed following the standard protocol during bolus
administration of intravenous contrast.
Contrast: 100mL OMNIPAQUE IOHEXOL 300 MG/ML  SOLN

[Series 2: cap with st · axial · 0.78mm/px · z∈[-548,+66]mm · 13 of 135 slices shown, 15 images]
[im 6/135  soft-tissue]
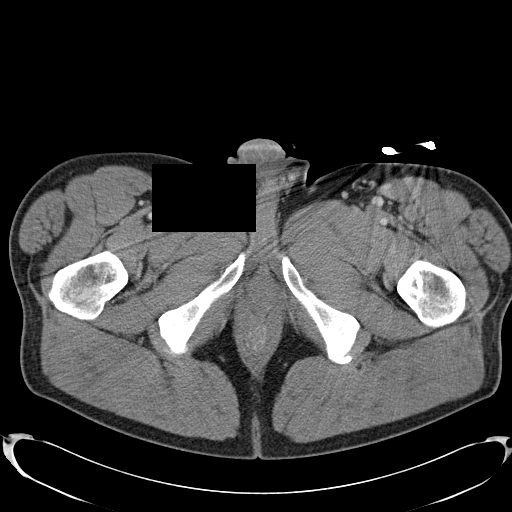
[im 6/135  bone]
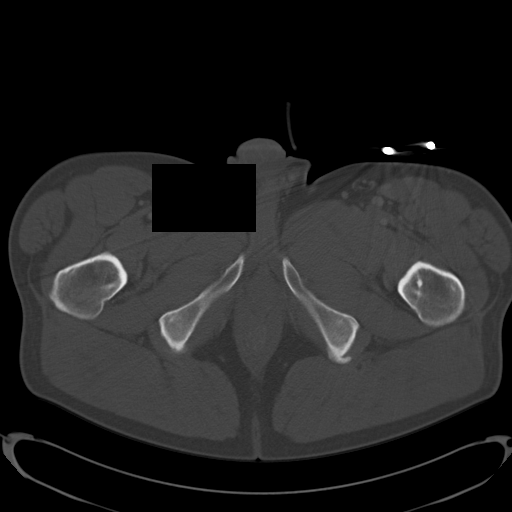
[im 17/135  soft-tissue]
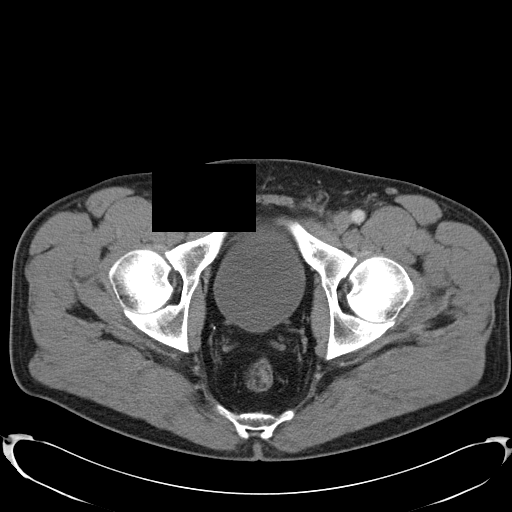
[im 28/135  soft-tissue]
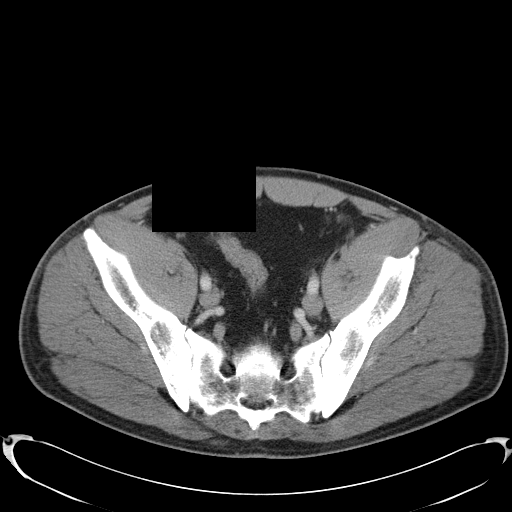
[im 40/135  soft-tissue]
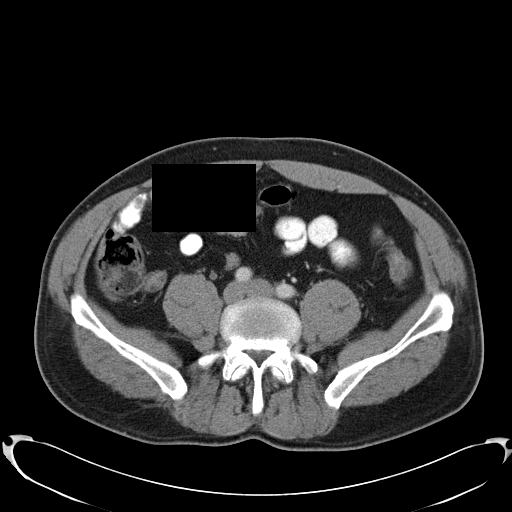
[im 45/135  soft-tissue]
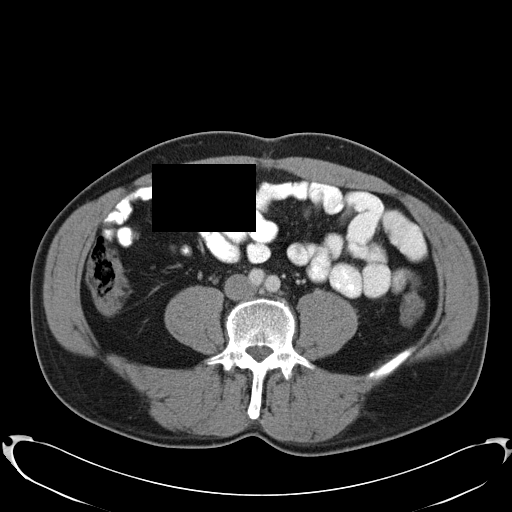
[im 56/135  soft-tissue]
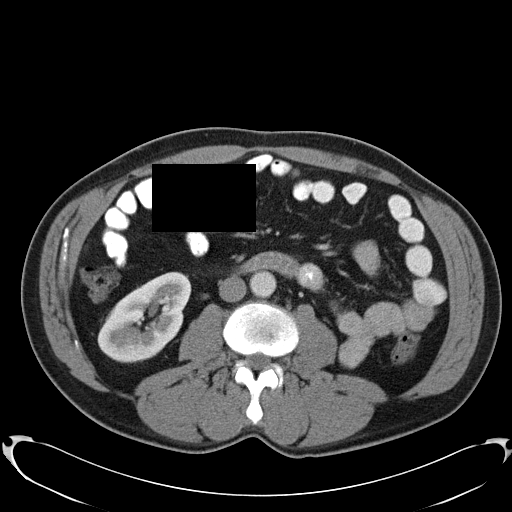
[im 68/135  soft-tissue]
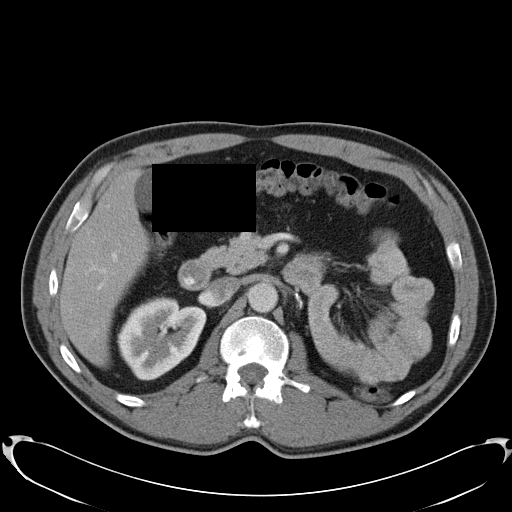
[im 79/135  soft-tissue]
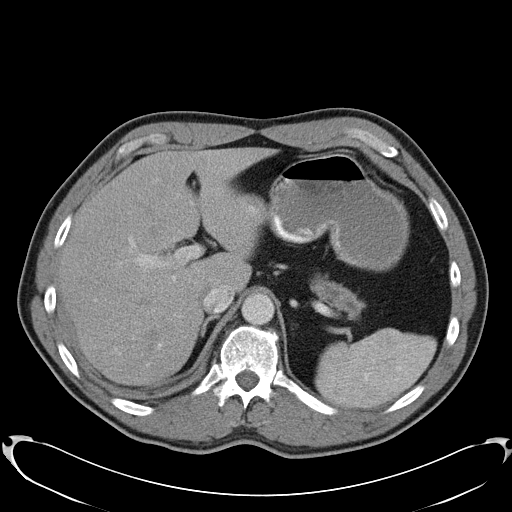
[im 90/135  soft-tissue]
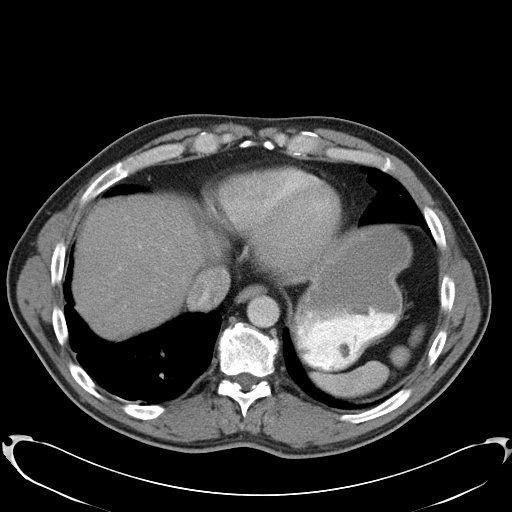
[im 90/135  bone]
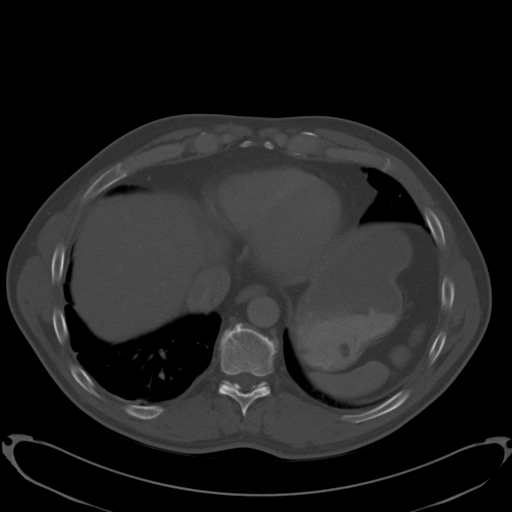
[im 95/135  soft-tissue]
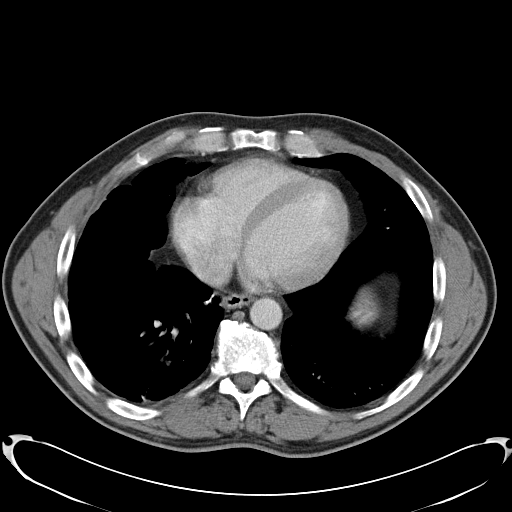
[im 107/135  soft-tissue]
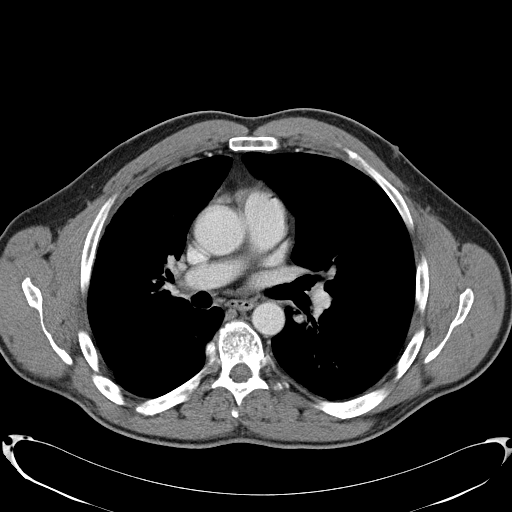
[im 118/135  soft-tissue]
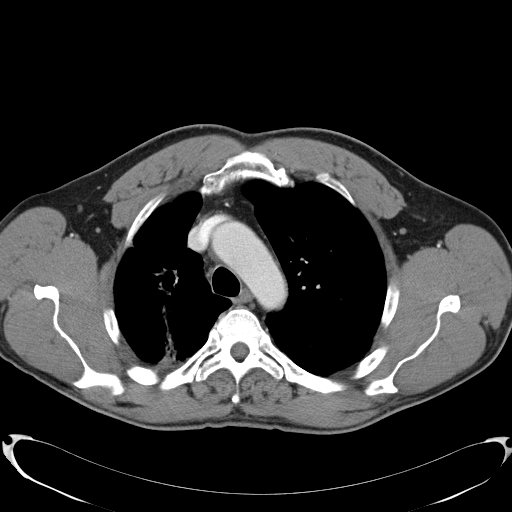
[im 129/135  soft-tissue]
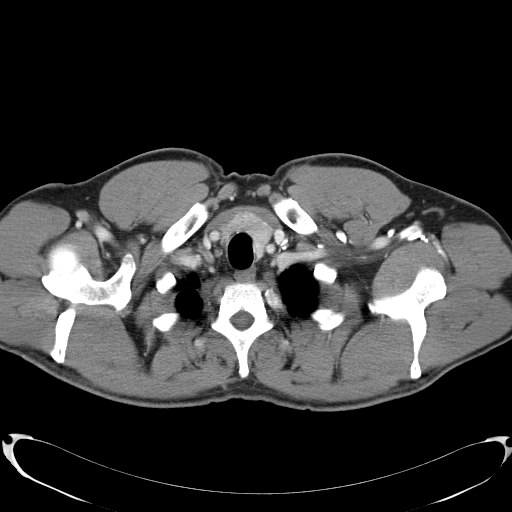

[Series 602: <mpr thick range> · coronal · 1.31mm/px · 3 of 99 slices shown]
[im 33/99  soft-tissue]
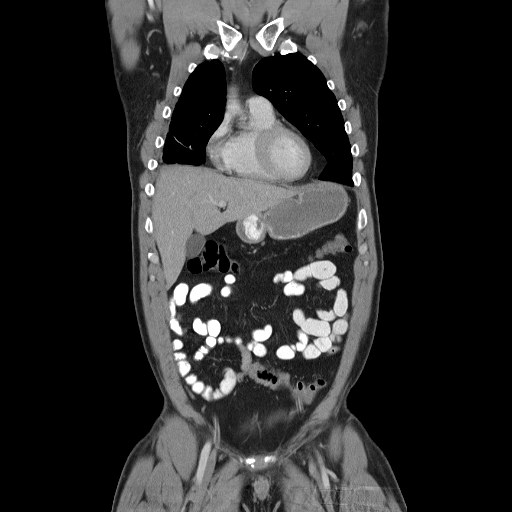
[im 44/99  soft-tissue]
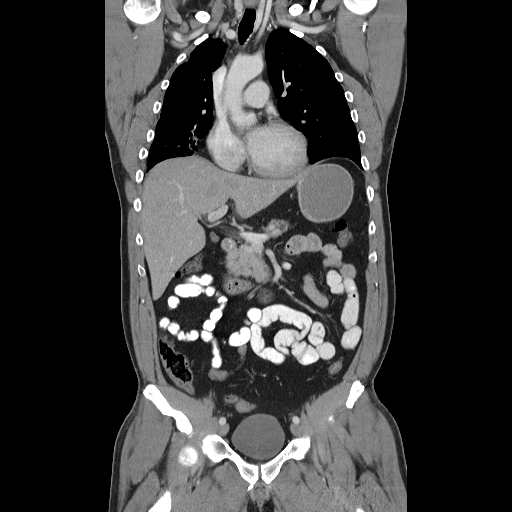
[im 55/99  soft-tissue]
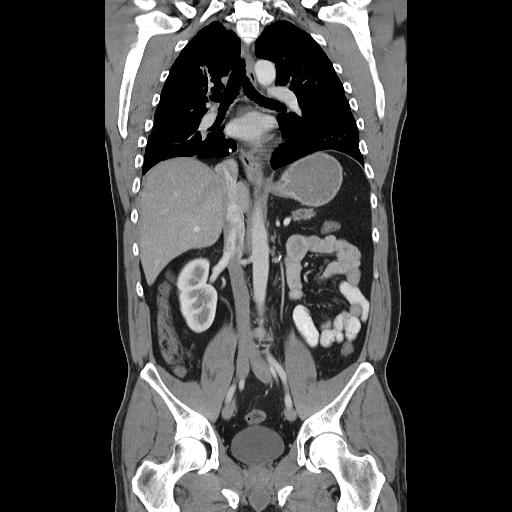

[16 of 46 positions shown; findings below may reference images not displayed]

FINDINGS: Pleural - parenchymal scarring seen involving all lobes
of the right lung are stable in appearance.  No areas of acute
pulmonary infiltrate are seen.  No suspicious pulmonary nodules or
masses are identified.  No central endobronchial lesion identified.

There is no evidence of pleural or pericardial effusion.  No
evidence of hilar or mediastinal masses.  No adenopathy seen
elsewhere within the thorax.  No suspicious bone lesions are
identified.
IMPRESSION: Stable right lung scarring.  No evidence of metastatic
disease or other acute findings.

CT ABDOMEN AND PELVIS
FINDINGS: Tiny sub-centimeter cyst in the anterior segment right
hepatic lobe is stable.  No liver masses are identified.  No
evidence of biliary ductal dilatation.  Gallbladder is
unremarkable.

A 1.6 x 1.8 cm mass in the pancreatic body is stable.  Distal
pancreatic ductal dilatation is again demonstrated.  No evidence of
peripancreatic inflammatory changes or lymphadenopathy.

The spleen, adrenal glands, and right kidney are normal in
appearance.  Prior left nephrectomy again noted.  No evidence of
lymphadenopathy within the abdomen or pelvis.

No other soft tissue masses are identified.  No evidence of
inflammatory process or abnormal fluid collections.  No evidence of
bowel wall thickening or dilatation.  No suspicious bone lesions
are identified.
IMPRESSION: 1.  Stable 1.8 cm hypervascular mass in the pancreatic body,
suspicious for metastatic disease.
2.  No new or progressive disease identified within the abdomen or
pelvis.

## 2014-03-06 ENCOUNTER — Other Ambulatory Visit: Payer: Self-pay | Admitting: Medical Oncology

## 2014-03-06 DIAGNOSIS — C649 Malignant neoplasm of unspecified kidney, except renal pelvis: Secondary | ICD-10-CM

## 2014-03-06 MED ORDER — PAZOPANIB HCL 200 MG PO TABS: 800.0000 mg | ORAL_TABLET | Freq: Every morning | ORAL | Status: DC

## 2014-03-06 NOTE — Telephone Encounter (Signed)
Fax to [facility] @ [phone]  for refill of Votrient tablet 200 mg.

## 2014-03-06 NOTE — Telephone Encounter (Signed)
Fax from [facility] confirming receipt of Votrient and in insurance verification process at this time.

## 2014-03-10 ENCOUNTER — Encounter: Payer: Self-pay | Admitting: Radiation Oncology

## 2014-03-10 NOTE — Progress Notes (Signed)
Histology and Location of Primary Cancer: stage IV renal cell carcinoma diagnosed in 2009  Sites of Visceral and Bony Metastatic Disease: lower lobe lung nodule and right sacral bone   Location(s) of Symptomatic Metastases: left humerus  Past/Anticipated chemotherapy by medical oncology, if any: Sutent   Pain on a scale of 0-10 is: diffuse pain     If Spine Met(s), symptoms, if any, include:  Bowel/Bladder retention or incontinence (please describe): NO  Numbness or weakness in extremities (please describe): left arm pain interfering with daily function and activity but, denies weakness  Current Decadron regimen, if applicable: NO  Ambulatory status? Walker? Wheelchair?:   SAFETY ISSUES:  Prior radiation? YES  Pacemaker/ICD?   Possible current pregnancy? NO  Is the patient on methotrexate? NO  Current Complaints / other details:  48 year old male. 02-27-14 MRI of left humerus revealed a slightly enlarged lesion. Referred by [facility] for palliative radiation treatment.

## 2014-03-13 ENCOUNTER — Ambulatory Visit
Admission: RE | Admit: 2014-03-13 | Discharge: 2014-03-13 | Disposition: A | Discharge status: Home / Self Care | Payer: BC Managed Care – PPO | Source: Ambulatory Visit | Attending: Radiation Oncology | Admitting: Radiation Oncology

## 2014-03-13 ENCOUNTER — Encounter: Payer: Self-pay | Admitting: Radiation Oncology

## 2014-03-13 VITALS — BP 141/98 | HR 77 | Temp 97.9°F | Resp 18 | Ht 74.0 in | Wt 221.9 lb

## 2014-03-13 DIAGNOSIS — C7952 Secondary malignant neoplasm of bone marrow: Secondary | ICD-10-CM

## 2014-03-13 DIAGNOSIS — C7951 Secondary malignant neoplasm of bone: Secondary | ICD-10-CM

## 2014-03-13 DIAGNOSIS — C649 Malignant neoplasm of unspecified kidney, except renal pelvis: Secondary | ICD-10-CM | POA: Insufficient documentation

## 2014-03-13 DIAGNOSIS — C7800 Secondary malignant neoplasm of unspecified lung: Secondary | ICD-10-CM | POA: Insufficient documentation

## 2014-03-13 DIAGNOSIS — Z79899 Other long term (current) drug therapy: Secondary | ICD-10-CM | POA: Insufficient documentation

## 2014-03-13 MED ORDER — OXYCODONE HCL 5 MG PO CAPS: 5.0000 mg | ORAL_CAPSULE | ORAL | Status: DC | PRN

## 2014-03-13 NOTE — Progress Notes (Signed)
Radiation Oncology         (336) [phone] ________________________________  Name: Keith [name] MRN: [id number]  Date: 03/13/2014  DOB: [birth year]  Follow-Up Visit Note  CC: [name], MD  [name], MD  Diagnosis:   48 year old gentleman with painful left humeral metastasis from metastatic renal cell carcinoma with previous courses of stereotactic body radiotherapy:  1. May 2011 - stereotactic body radiotherapy to three isolated pulmonary metastases (1.2 cm in the right upper lobe, 5 mm in the right upper lobe and 1 cm in the right lung base) to 50 Gy in five fractions 2. 4/22-30/2014 isolated right sacral oligometastatic deposit to 50 gray in 5 fractions   Interval Since Last Radiation:  11  months  Narrative:  The patient returns today  complaining of severe left upper arm pain.     Patient here today to discuss radiation to left humerus. Understands he has a lesion at his right elbow but, denies it is painful at this time. Reports constant left humerus pain 7 on a scale of 0-10. Reports taking over the counter motrin but, denies having having any prescription pain medication prescribed. Reports the motrin doesn't help to relieve left humerus pain. Patient reports the pain is worse at night. Reports numbness in his left hand. Reports diarrhea related to chemo pill, Votrient. Reports cough with white sputum. Denies nausea, vomiting, headache or dizziness. Made to quit work in October. Explains he is living one day at a time. Denies pain in right/previously treated hip                        ALLERGIES:  is allergic to ceftriaxone and hydrocodone.  Meds: Current Outpatient Prescriptions  Medication Sig Dispense Refill  . ALPRAZolam (XANAX) 1 MG tablet Take 1 tablet (1 mg total) by mouth every 8 (eight) hours as needed. Anxiety  50 tablet  0  . DULoxetine (CYMBALTA) 60 MG capsule Take 60 mg by mouth every morning.       [name] ibuprofen (ADVIL,MOTRIN) 800 MG tablet Take 800 mg by  mouth every 8 (eight) hours as needed.      . magnesium hydroxide (MILK OF MAGNESIA) 400 MG/5ML suspension Take 15 mLs by mouth daily as needed for constipation.  360 mL  0  . pazopanib (VOTRIENT) 200 MG tablet Take 4 tablets (800 mg total) by mouth every morning. Take on an empty stomach.  120 tablet  0  . oxycodone (OXY-IR) 5 MG capsule Take 1-3 capsules (5-15 mg total) by mouth every 4 (four) hours as needed.  120 capsule  0   No current facility-administered medications for this encounter.    Physical Findings: The patient is in no acute distress. Patient is alert and oriented.  height is 6\' 2"  (1.88 m) and weight is 221 lb 14.4 oz (100.653 kg). His oral temperature is 97.9 F (36.6 C). His blood pressure is 141/98 and his pulse is 77. His respiration is 18 and oxygen saturation is 99%. . The patient localizes pain to his left upper arm correlating with the humerus bone metastases seen on MRI.  Lab Findings: Lab Results  Component Value Date   WBC 6.9 02/24/2014   HGB 15.4 02/24/2014   HCT 46.2 02/24/2014   MCV 107.0* 02/24/2014   PLT 161 02/24/2014    @LASTCHEM @  Radiographic Findings: Mr Humerus Left W Wo Contrast  02/28/2014   CLINICAL DATA:  Continuous left arm pain. Metastatic renal cell carcinoma. Lesion in  the proximal humerus seen on MRI dated 11/29/2013  EXAM: MRI OF THE LEFT HUMERUS WITHOUT AND WITH CONTRAST  TECHNIQUE: Multiplanar, multisequence MR imaging was performed both before and after administration of intravenous contrast.  CONTRAST:  69mL MULTIHANCE GADOBENATE DIMEGLUMINE 529 MG/ML IV SOLN  COMPARISON:  MRI dated 11/29/2013  FINDINGS: The lesion in the posterior aspect of the proximal left humeral shaft has increased from 6 mm to 9 mm in diameter with increased edema in the surrounding portion of the humerus. The lesion enhances after contrast administration. The finding is consistent with metastatic disease.  There are 2 tiny cystic lesions in the humeral head which appear  stable.  No other abnormality.  IMPRESSION: Enlarging enhancing mass lesion in the proximal left humeral shaft consistent with metastatic disease.   Electronically Signed   By: Rozetta Nunnery M.D.   On: 02/28/2014 09:22    Impression:  The patient  has a painful bone metastasis from metastatic renal cell carcinoma involving the left proximal humerus. The patient would benefit from localized palliative radiotherapy to this region.  Plan:  Today, I talked to the patient his arm pain and the recent MRI.  We discussed the potential options, highlighting the role or radiotherapy in the management.  We discussed the available radiation techniques, and focused on the details of logistics and delivery.  We reviewed the anticipated acute and late sequelae associated with radiation in this setting.  The patient was encouraged to ask questions that I answered to the best of my ability.  The patient would like to proceed with radiation and will be scheduled for CT simulation.  I spent 30 minutes minutes face to face with the patient and more than 50% of that time was spent in counseling and/or coordination of care.   _____________________________________  Sheral Apley. Tammi Klippel, M.D.

## 2014-03-13 NOTE — Progress Notes (Addendum)
Patient here today to discuss radiation to left humerus. Understands he has a lesion at his right elbow but, denies it is painful at this time. Reports constant left humerus pain 7 on a scale of 0-10. Reports taking over the counter motrin but, denies having having any prescription pain medication prescribed. Reports the motrin doesn't help to relieve left humerus pain. Patient reports the pain is worse at night. Reports numbness in his left hand. Reports diarrhea related to chemo pill, Votrient. Reports cough with white sputum. Denies nausea, vomiting, headache or dizziness. Made to quit work in October. Explains he is living one day at a time. Denies pain in right/previously treated hip.

## 2014-03-13 NOTE — Telephone Encounter (Signed)
RECEIVED A FAX FROM [facility] CONCERNING A CONFIRMATION OF PRESCRIPTION SHIPMENT FOR VOTRIENT ON 03/10/14.

## 2014-03-13 NOTE — Progress Notes (Signed)
See progress note under physician encounter. 

## 2014-03-15 ENCOUNTER — Ambulatory Visit
Admission: RE | Admit: 2014-03-15 | Discharge: 2014-03-15 | Disposition: A | Discharge status: Home / Self Care | Payer: BC Managed Care – PPO | Source: Ambulatory Visit | Attending: Radiation Oncology | Admitting: Radiation Oncology

## 2014-03-15 DIAGNOSIS — Z510 Encounter for antineoplastic radiation therapy: Secondary | ICD-10-CM | POA: Insufficient documentation

## 2014-03-15 DIAGNOSIS — T451X5A Adverse effect of antineoplastic and immunosuppressive drugs, initial encounter: Secondary | ICD-10-CM | POA: Insufficient documentation

## 2014-03-15 DIAGNOSIS — R197 Diarrhea, unspecified: Secondary | ICD-10-CM | POA: Insufficient documentation

## 2014-03-15 DIAGNOSIS — C7951 Secondary malignant neoplasm of bone: Secondary | ICD-10-CM

## 2014-03-15 DIAGNOSIS — R112 Nausea with vomiting, unspecified: Secondary | ICD-10-CM | POA: Insufficient documentation

## 2014-03-15 DIAGNOSIS — M79609 Pain in unspecified limb: Secondary | ICD-10-CM | POA: Insufficient documentation

## 2014-03-15 DIAGNOSIS — C7952 Secondary malignant neoplasm of bone marrow: Secondary | ICD-10-CM

## 2014-03-15 DIAGNOSIS — C649 Malignant neoplasm of unspecified kidney, except renal pelvis: Secondary | ICD-10-CM | POA: Insufficient documentation

## 2014-03-15 DIAGNOSIS — M25519 Pain in unspecified shoulder: Secondary | ICD-10-CM | POA: Insufficient documentation

## 2014-03-15 NOTE — Progress Notes (Signed)
  Radiation Oncology         (336) [phone] ________________________________  Name: Keith [name] MRN: [id number]  Date: 03/15/2014  DOB: Apr 24, 1966  SIMULATION AND TREATMENT PLANNING NOTE  DIAGNOSIS:  48 yo man with painful left humerus metastasis from renal cell carcinoma  NARRATIVE:  The patient was brought to the [facility].  Identity was confirmed.  All relevant records and images related to the planned course of therapy were reviewed.  The patient freely provided informed written consent to proceed with treatment after reviewing the details related to the planned course of therapy. The consent form was witnessed and verified by the simulation staff.  Then, the patient was set-up in a stable reproducible  supine position for radiation therapy.  CT images were obtained.  Surface markings were placed.  The CT images were loaded into the planning software.  Then the target and avoidance structures were contoured.  Treatment planning then occurred.  The radiation prescription was entered and confirmed.  Then, I designed and supervised the construction of a total of 2 medically necessary complex treatment devices as MLC apertures to shape radiation.  I have requested : Isodose Plan.   PLAN:  The patient will receive 30 Gy in 10 fractions.  ________________________________  Sheral Apley Tammi Klippel, M.D.

## 2014-03-16 ENCOUNTER — Other Ambulatory Visit: Payer: Self-pay | Admitting: Oncology

## 2014-03-20 ENCOUNTER — Ambulatory Visit: Payer: BC Managed Care – PPO

## 2014-03-20 ENCOUNTER — Encounter: Payer: Self-pay | Admitting: Radiation Oncology

## 2014-03-20 ENCOUNTER — Ambulatory Visit
Admission: RE | Admit: 2014-03-20 | Discharge: 2014-03-20 | Disposition: A | Discharge status: Home / Self Care | Payer: BC Managed Care – PPO | Source: Ambulatory Visit | Attending: Radiation Oncology | Admitting: Radiation Oncology

## 2014-03-20 NOTE — Progress Notes (Signed)
  Radiation Oncology         [phone] ________________________________  Name: Keith [name] MRN: [id number]  Date: 03/20/2014  DOB: 04/14/1966  Simulation Verification Note  Status: outpatient  NARRATIVE: The patient was brought to the treatment unit and placed in the planned treatment position. The clinical setup was verified. Then port films were obtained and uploaded to the radiation oncology medical record software.  The treatment beams were carefully compared against the planned radiation fields. The position location and shape of the radiation fields was reviewed. They targeted volume of tissue appears to be appropriately covered by the radiation beams. Organs at risk appear to be excluded as planned.  Based on my personal review, I approved the simulation verification. The patient's treatment will proceed as planned.  -----------------------------------  Blair Promise, PhD, MD

## 2014-03-21 ENCOUNTER — Ambulatory Visit: Payer: BC Managed Care – PPO

## 2014-03-21 ENCOUNTER — Ambulatory Visit
Admission: RE | Admit: 2014-03-21 | Discharge: 2014-03-21 | Disposition: A | Discharge status: Home / Self Care | Payer: BC Managed Care – PPO | Source: Ambulatory Visit | Attending: Radiation Oncology | Admitting: Radiation Oncology

## 2014-03-22 ENCOUNTER — Ambulatory Visit: Payer: BC Managed Care – PPO

## 2014-03-22 ENCOUNTER — Ambulatory Visit
Admission: RE | Admit: 2014-03-22 | Discharge: 2014-03-22 | Disposition: A | Discharge status: Home / Self Care | Payer: BC Managed Care – PPO | Source: Ambulatory Visit | Attending: Radiation Oncology | Admitting: Radiation Oncology

## 2014-03-23 ENCOUNTER — Ambulatory Visit
Admission: RE | Admit: 2014-03-23 | Discharge: 2014-03-23 | Disposition: A | Discharge status: Home / Self Care | Payer: BC Managed Care – PPO | Source: Ambulatory Visit | Attending: Radiation Oncology | Admitting: Radiation Oncology

## 2014-03-23 ENCOUNTER — Ambulatory Visit: Payer: BC Managed Care – PPO

## 2014-03-24 ENCOUNTER — Ambulatory Visit
Admission: RE | Admit: 2014-03-24 | Discharge: 2014-03-24 | Disposition: A | Discharge status: Home / Self Care | Payer: BC Managed Care – PPO | Source: Ambulatory Visit | Attending: Radiation Oncology | Admitting: Radiation Oncology

## 2014-03-24 ENCOUNTER — Ambulatory Visit: Payer: BC Managed Care – PPO

## 2014-03-24 ENCOUNTER — Encounter: Payer: Self-pay | Admitting: Radiation Oncology

## 2014-03-24 VITALS — BP 146/100 | HR 73 | Resp 16 | Wt 218.0 lb

## 2014-03-24 DIAGNOSIS — C7951 Secondary malignant neoplasm of bone: Secondary | ICD-10-CM

## 2014-03-24 NOTE — Progress Notes (Signed)
Reports left arm pain 5 on a scale of 0-10 from his elbow up to his shoulder. Decreased ROM related to pain. Reports intermittent numbness and tingling in left hand. Denies nausea, vomiting, headache or dizziness.

## 2014-03-24 NOTE — Progress Notes (Signed)
   Weekly Management Note:  outpatient Current Dose:  15 Gy  Projected Dose: 30 Gy   Narrative:  The patient presents for routine under treatment assessment.  CBCT/MVCT images/Port film x-rays were reviewed.  The chart was checked. Pain, ROM in Left shoulder improved  Physical Findings:  weight is 218 lb (98.884 kg). His blood pressure is 146/100 and his pulse is 73. His respiration is 16.  Able to abduct L shoulder past 90 degrees  Impression:  The patient is tolerating radiotherapy.  Plan:  Continue radiotherapy as planned.   ________________________________   Eppie Gibson, M.D.

## 2014-03-27 ENCOUNTER — Ambulatory Visit: Payer: BC Managed Care – PPO

## 2014-03-27 ENCOUNTER — Ambulatory Visit
Admission: RE | Admit: 2014-03-27 | Discharge: 2014-03-27 | Disposition: A | Discharge status: Home / Self Care | Payer: BC Managed Care – PPO | Source: Ambulatory Visit | Attending: Radiation Oncology | Admitting: Radiation Oncology

## 2014-03-28 ENCOUNTER — Ambulatory Visit: Payer: BC Managed Care – PPO

## 2014-03-28 ENCOUNTER — Ambulatory Visit
Admission: RE | Admit: 2014-03-28 | Discharge: 2014-03-28 | Disposition: A | Discharge status: Home / Self Care | Payer: BC Managed Care – PPO | Source: Ambulatory Visit | Attending: Radiation Oncology | Admitting: Radiation Oncology

## 2014-03-29 ENCOUNTER — Ambulatory Visit: Payer: BC Managed Care – PPO

## 2014-03-29 ENCOUNTER — Ambulatory Visit
Admission: RE | Admit: 2014-03-29 | Discharge: 2014-03-29 | Disposition: A | Discharge status: Home / Self Care | Payer: BC Managed Care – PPO | Source: Ambulatory Visit | Attending: Radiation Oncology | Admitting: Radiation Oncology

## 2014-03-30 ENCOUNTER — Ambulatory Visit
Admission: RE | Admit: 2014-03-30 | Discharge: 2014-03-30 | Disposition: A | Discharge status: Home / Self Care | Payer: BC Managed Care – PPO | Source: Ambulatory Visit | Attending: Radiation Oncology | Admitting: Radiation Oncology

## 2014-03-30 ENCOUNTER — Encounter: Payer: Self-pay | Admitting: Radiation Oncology

## 2014-03-30 ENCOUNTER — Ambulatory Visit: Payer: BC Managed Care – PPO

## 2014-03-30 VITALS — BP 137/99 | HR 76 | Resp 16 | Wt 212.0 lb

## 2014-03-30 DIAGNOSIS — C649 Malignant neoplasm of unspecified kidney, except renal pelvis: Secondary | ICD-10-CM

## 2014-03-30 DIAGNOSIS — C419 Malignant neoplasm of bone and articular cartilage, unspecified: Secondary | ICD-10-CM

## 2014-03-30 NOTE — Progress Notes (Addendum)
Reports intense lower leg pain from knee to feet x 2 weeks. Reports left shoulder pain now radiates down posterior humerus with numbness and tingling. No edema noted in any extremities. Provided patient with a one month follow up appointment card. Reports manageable fatigue. Reports nausea and vomiting x 2 episodes this week following radiation treatment. Reports diarrhea continues associated with effects of chemotherapy. Denies headache or dizziness. Patient has lost six pounds in one week. Reports everything "taste like metal."

## 2014-03-30 NOTE — Progress Notes (Signed)
  Radiation Oncology         [phone] ________________________________  Name: Keith [name]  MRN: [id number]  Date: 03/30/2014  DOB: 05-15-1966  Weekly Radiation Therapy Management  Current Dose: 27 Gy     Planned Dose:  30 Gy  Narrative . . . . . . . . The patient presents for routine under treatment assessment.                                  Reports intense lower leg pain from knee to feet x 2 weeks. Reports left shoulder pain now radiates down posterior humerus with numbness and tingling. No edema noted in any extremities. Provided patient with a one month follow up appointment card. Reports manageable fatigue. Reports nausea and vomiting x 2 episodes this week following radiation treatment. Reports diarrhea continues associated with effects of chemotherapy. Denies headache or dizziness. Patient has lost six pounds in one week. Reports everything "taste like metal."                                 Set-up films were reviewed.                                 The chart was checked. Physical Findings. . .  weight is 212 lb (96.163 kg). His blood pressure is 137/99 and his pulse is 76. His respiration is 16. . Weight essentially stable.  No significant changes. Impression . . . . . . . The patient is tolerating radiation. Plan . . . . . . . . . . . . Continue treatment as planned.  ________________________________  Sheral Apley. Tammi Klippel, M.D.

## 2014-03-31 ENCOUNTER — Ambulatory Visit: Payer: BC Managed Care – PPO

## 2014-03-31 ENCOUNTER — Ambulatory Visit
Admission: RE | Admit: 2014-03-31 | Discharge: 2014-03-31 | Disposition: A | Discharge status: Home / Self Care | Payer: BC Managed Care – PPO | Source: Ambulatory Visit | Attending: Radiation Oncology | Admitting: Radiation Oncology

## 2014-03-31 ENCOUNTER — Encounter: Payer: Self-pay | Admitting: Radiation Oncology

## 2014-03-31 NOTE — Progress Notes (Signed)
  Radiation Oncology         [phone] ________________________________  Name: Keith [name] MRN: [id number]  Date: 03/31/2014  DOB: [birth year]  End of Treatment Note  Diagnosis:   48 yo man with painful left humerus metastasis from renal cell carcinoma  Indication for treatment:  Palliation of pain       Radiation treatment dates:   03/20/2014-03/31/2014  Site/dose:   The patient's left shoulder was treated to 30 gray in 10 fractions of 3 gray  Beams/energy:   The patient was treated using anterior and posterior 6 megavolt photon fields.  Narrative: The patient tolerated radiation treatment relatively well.   His pain improved during the course radiation treatment.  Plan: The patient has completed radiation treatment. The patient will return to radiation oncology clinic for routine followup in one month. I advised them to call or return sooner if they have any questions or concerns related to their recovery or treatment. ________________________________  Sheral Apley. Tammi Klippel, M.D.

## 2014-04-02 ENCOUNTER — Encounter: Payer: Self-pay | Admitting: Radiation Oncology

## 2014-04-03 ENCOUNTER — Other Ambulatory Visit: Payer: Self-pay | Admitting: *Deleted

## 2014-04-03 DIAGNOSIS — C649 Malignant neoplasm of unspecified kidney, except renal pelvis: Secondary | ICD-10-CM

## 2014-04-03 NOTE — Telephone Encounter (Signed)
THIS REFILL REQUEST FOR VOTRIENT WAS PLACED IN DR.SHADAD'S ACTIVE WORK FOLDER.

## 2014-04-06 MED ORDER — PAZOPANIB HCL 200 MG PO TABS: 800.0000 mg | ORAL_TABLET | Freq: Every morning | ORAL | Status: DC

## 2014-04-06 NOTE — Telephone Encounter (Signed)
RECEIVED A FAX FROM [facility] CONCERNING A CONFIRMATION OF FACS[facility] RECEIPT FOR PT. REFERRAL. 

## 2014-04-06 NOTE — Addendum Note (Signed)
Addended by: Wyonia Hough on: 04/06/2014 10:37 AM   Modules accepted: Orders

## 2014-04-07 ENCOUNTER — Other Ambulatory Visit: Payer: Self-pay | Admitting: Oncology

## 2014-04-07 ENCOUNTER — Telehealth: Payer: Self-pay | Admitting: Medical Oncology

## 2014-04-07 ENCOUNTER — Other Ambulatory Visit: Payer: BC Managed Care – PPO

## 2014-04-07 ENCOUNTER — Ambulatory Visit: Payer: BC Managed Care – PPO | Admitting: Oncology

## 2014-04-07 NOTE — Telephone Encounter (Signed)
LVOM with patient regarding missed appt. Requested patient to call office back at his convenience.

## 2014-04-10 ENCOUNTER — Telehealth: Payer: Self-pay | Admitting: Medical Oncology

## 2014-04-10 NOTE — Telephone Encounter (Signed)
Patient called regarding missing appt last Friday, states his wife has been sick and then he's been sick as well. Informed patient has appt tomorrow with lab/NP @ 1015, patient confirmed. Denies questions at this time.

## 2014-04-11 ENCOUNTER — Ambulatory Visit (HOSPITAL_BASED_OUTPATIENT_CLINIC_OR_DEPARTMENT_OTHER): Payer: BC Managed Care – PPO | Admitting: Oncology

## 2014-04-11 ENCOUNTER — Other Ambulatory Visit (HOSPITAL_BASED_OUTPATIENT_CLINIC_OR_DEPARTMENT_OTHER): Payer: BC Managed Care – PPO

## 2014-04-11 ENCOUNTER — Encounter: Payer: Self-pay | Admitting: *Deleted

## 2014-04-11 ENCOUNTER — Encounter: Payer: Self-pay | Admitting: Oncology

## 2014-04-11 ENCOUNTER — Other Ambulatory Visit: Payer: Self-pay | Admitting: *Deleted

## 2014-04-11 ENCOUNTER — Telehealth: Payer: Self-pay | Admitting: Oncology

## 2014-04-11 VITALS — BP 139/94 | HR 63 | Temp 97.9°F | Resp 18 | Ht 74.0 in | Wt 215.7 lb

## 2014-04-11 DIAGNOSIS — M79609 Pain in unspecified limb: Secondary | ICD-10-CM

## 2014-04-11 DIAGNOSIS — C7800 Secondary malignant neoplasm of unspecified lung: Secondary | ICD-10-CM

## 2014-04-11 DIAGNOSIS — M949 Disorder of cartilage, unspecified: Secondary | ICD-10-CM

## 2014-04-11 DIAGNOSIS — C7889 Secondary malignant neoplasm of other digestive organs: Secondary | ICD-10-CM

## 2014-04-11 DIAGNOSIS — D492 Neoplasm of unspecified behavior of bone, soft tissue, and skin: Secondary | ICD-10-CM

## 2014-04-11 DIAGNOSIS — C649 Malignant neoplasm of unspecified kidney, except renal pelvis: Secondary | ICD-10-CM

## 2014-04-11 DIAGNOSIS — G5790 Unspecified mononeuropathy of unspecified lower limb: Secondary | ICD-10-CM

## 2014-04-11 DIAGNOSIS — M899 Disorder of bone, unspecified: Secondary | ICD-10-CM

## 2014-04-11 DIAGNOSIS — F341 Dysthymic disorder: Secondary | ICD-10-CM

## 2014-04-11 LAB — CBC WITH DIFFERENTIAL/PLATELET
BASO%: 0.5 % (ref 0.0–2.0)
Basophils Absolute: 0 10*3/uL (ref 0.0–0.1)
EOS%: 1.9 % (ref 0.0–7.0)
Eosinophils Absolute: 0.1 10*3/uL (ref 0.0–0.5)
HCT: 46.3 % (ref 38.4–49.9)
HGB: 15.2 g/dL (ref 13.0–17.1)
LYMPH%: 24.4 % (ref 14.0–49.0)
MCH: 35.3 pg — ABNORMAL HIGH (ref 27.2–33.4)
MCHC: 32.8 g/dL (ref 32.0–36.0)
MCV: 107.7 fL — ABNORMAL HIGH (ref 79.3–98.0)
MONO#: 0.4 10*3/uL (ref 0.1–0.9)
MONO%: 6.6 % (ref 0.0–14.0)
NEUT#: 4.2 10*3/uL (ref 1.5–6.5)
NEUT%: 66.6 % (ref 39.0–75.0)
Platelets: 145 10*3/uL (ref 140–400)
RBC: 4.3 10*6/uL (ref 4.20–5.82)
RDW: 14.6 % (ref 11.0–14.6)
WBC: 6.2 10*3/uL (ref 4.0–10.3)
lymph#: 1.5 10*3/uL (ref 0.9–3.3)

## 2014-04-11 LAB — COMPREHENSIVE METABOLIC PANEL (CC13)
ALT: 49 U/L (ref 0–55)
AST: 30 U/L (ref 5–34)
Albumin: 3.9 g/dL (ref 3.5–5.0)
Alkaline Phosphatase: 100 U/L (ref 40–150)
Anion Gap: 10 mEq/L (ref 3–11)
BUN: 9.5 mg/dL (ref 7.0–26.0)
CO2: 29 mEq/L (ref 22–29)
Calcium: 9.9 mg/dL (ref 8.4–10.4)
Chloride: 104 mEq/L (ref 98–109)
Creatinine: 1.2 mg/dL (ref 0.7–1.3)
Glucose: 141 mg/dl — ABNORMAL HIGH (ref 70–140)
Potassium: 4.4 mEq/L (ref 3.5–5.1)
Sodium: 143 mEq/L (ref 136–145)
Total Bilirubin: 0.48 mg/dL (ref 0.20–1.20)
Total Protein: 7.8 g/dL (ref 6.4–8.3)

## 2014-04-11 MED ORDER — DULOXETINE HCL 60 MG PO CPEP: 60.0000 mg | ORAL_CAPSULE | Freq: Two times a day (BID) | ORAL | Status: DC

## 2014-04-11 MED ORDER — PAZOPANIB HCL 200 MG PO TABS: 800.0000 mg | ORAL_TABLET | Freq: Every morning | ORAL | Status: DC

## 2014-04-11 MED ORDER — ALPRAZOLAM 1 MG PO TABS: ORAL_TABLET | ORAL | Status: DC

## 2014-04-11 NOTE — Progress Notes (Signed)
Hematology and Oncology Follow Up Visit  Keith [name] [id number] [birth year] 48 y.o. 04/11/2014 3:22 PM  Keith Bring, MD  Keith [name], M.D.  Keith Bent, MD  Keith Banister, MD    Principle Diagnosis: This is a 48 year old gentleman with stage IV renal cell carcinoma diagnosed in 2009.  Prior Therapy: 1. Status post laparoscopic radical nephrectomy.  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 2. Patient status post thoracotomy done October 2009.  He had a lower lobe nodule, biopsy proven to be metastatic renal cell carcinoma.   3. Patient is status post stereotactic radiotherapy to pulmonary nodules in May of 2010. 4. He is S/P Sutent 50 mg 4 weeks on 2 weeks off from November of 2011 to 03/2013. He progressed at that time.  5. He is S/P radiation to the right sacral bone between 4/22 to 4/30.  6. He is S/P XRT to the left shoulder 03/20/14 to 03/31/14.   Current therapy: He is on Votrient 800 mg since 03/2013. His disease include pancreatic involvement with RCC as well as right sacral lesion.   Interim History:  Mr. Keith [name] presents today for a followup visit.  He has tolerated Votrient very well without any new complications this month. He did report grade 1 hand-foot syndrome and mild diarrhea. He reports pain that is rather diffuse in nature and not very specific.  He is reporting increased neuropathic pain to his bilateral legs. He is currently on Cymbalta and feel that have might have helped his mood and diffuse pain.  Has not reported any other complications. He is not reporting any neurological symptoms. He reports pain to left arm has improved. Has not reported any recent hospitalizations or illnesses.  Medications: I have reviewed the patient's current medications.  Current Outpatient Prescriptions  Medication Sig Dispense Refill  . ALPRAZolam (XANAX) 1 MG tablet TAKE 1 TABLET BY MOUTH EVERY 8 HOURS AS NEEDED FOR ANXIETY  50 tablet  0  . DULoxetine  (CYMBALTA) 60 MG capsule Take 1 capsule (60 mg total) by mouth 2 (two) times daily.  60 capsule  2  . ibuprofen (ADVIL,MOTRIN) 800 MG tablet Take 800 mg by mouth every 8 (eight) hours as needed.      . magnesium hydroxide (MILK OF MAGNESIA) 400 MG/5ML suspension Take 15 mLs by mouth daily as needed for constipation.  360 mL  0  . oxycodone (OXY-IR) 5 MG capsule Take 1-3 capsules (5-15 mg total) by mouth every 4 (four) hours as needed.  120 capsule  0  . pazopanib (VOTRIENT) 200 MG tablet Take 4 tablets (800 mg total) by mouth every morning. Take on an empty stomach.  120 tablet  0   No current facility-administered medications for this visit.     Allergies:  Allergies  Allergen Reactions  . Ceftriaxone Hives  . Hydrocodone Swelling    Past Medical History, Surgical history, Social history, and Family History were reviewed and updated.  Review of Systems:  Remaining ROS negative.  Physical Exam: Blood pressure 139/94, pulse 63, temperature 97.9 F (36.6 C), temperature source Oral, resp. rate 18, height 6\' 2"  (1.88 m), weight 215 lb 11.2 oz (97.841 kg), SpO2 99.00%. ECOG: 1 General appearance: alert Head: Normocephalic, without obvious abnormality, atraumatic Neck: no adenopathy, no carotid bruit, no JVD, supple, symmetrical, trachea midline and thyroid not enlarged, symmetric, no tenderness/mass/nodules Lymph nodes: Cervical, supraclavicular, and axillary nodes normal. Heart:regular rate and rhythm, S1, S2 normal, no murmur, click, rub or gallop Lung:chest  clear, no wheezing, rales, normal symmetric air entry. Chest wall pain is reproducible with pressure. Abdomen: soft, non-tender, without masses or organomegaly EXT:no edema, no despumation. No erythema noted. Tender to touch at the ankle and shin area on both sides.   Neuro: no focal deficits noted. No weakness noted in his LE  Lab Results: Lab Results  Component Value Date   WBC 6.2 04/11/2014   HGB 15.2 04/11/2014   HCT  46.3 04/11/2014   MCV 107.7* 04/11/2014   PLT 145 04/11/2014     Impression and Plan:  This is a pleasant 48 year old gentleman with the following issues. 1. Metastatic renal cell carcinoma.  He has documented disease to the lung and the pancreas.  He tolerating Votrient without complications. CT scan from 01/20/2014 showed mostly stable disease. His pancreatic lesion are slightly increased but still does not warrant changing his medication. The plan is to continue the current medication without any change. Repeat CT scan in May 2015 which I have scheduled today. 2. Pancreatic thickening/mass.  This has been biopsy proven to be a renal cell metastasis. No change in treatment at this time. 3. Depression/Anxiety: He remains on Xanax and Cymbalta. 4. Left arm pain: MRI of the left humerus showed a slightly enlarged lesion based on the MRI that was done on 02/27/2014. He is S/P XRT to this area with improvement in his pain. 5. Sacral mass: S/P radiation. No pain reported.  6. Neuropathic pain. I have increased his Cymbalta to 60 mg BID. 7. Follow up: 4 weeks.  Keith [name]

## 2014-04-11 NOTE — Telephone Encounter (Signed)
[facility] PRINTED APTPS CHED AND AVS FOR PT FOR mAY....GV PT BARIUM

## 2014-04-11 NOTE — Progress Notes (Signed)
RECEIVED A FAX FROM [facility] CONCERNING A CONFIRMATION OF FACSIMILE RECEIPT FOR PT. REFERRAL. 

## 2014-04-20 ENCOUNTER — Telehealth: Payer: Self-pay | Admitting: Medical Oncology

## 2014-04-20 ENCOUNTER — Telehealth: Payer: Self-pay | Admitting: Oncology

## 2014-04-20 ENCOUNTER — Encounter: Payer: Self-pay | Admitting: Oncology

## 2014-04-20 NOTE — Telephone Encounter (Signed)
FS out - moved 5/28 appt to 6/2. s/w pt he is aware. also confirmed 5/26 lb/ct.

## 2014-04-20 NOTE — Telephone Encounter (Signed)
Patient called asking about status of his prescription of Votrient and approval for assistance. Informed patient will research and call him back.  Spoke with Carmelina Noun, in managed care, pt has been approved for assistance and per pharmacy pt should be receiving in 7-10 days. LVMOM with patient of information. Patient to return call should he have further questions or concerns.

## 2014-04-20 NOTE — Progress Notes (Signed)
Patient is approved to receive free zytiga from [employer] from 04/20/14-04/14/15; he should receive shipment.  For refills the patient needs to call [phone].

## 2014-04-21 ENCOUNTER — Other Ambulatory Visit: Payer: Self-pay | Admitting: Radiation Oncology

## 2014-04-21 DIAGNOSIS — C7952 Secondary malignant neoplasm of bone marrow: Principal | ICD-10-CM

## 2014-04-21 DIAGNOSIS — C7951 Secondary malignant neoplasm of bone: Secondary | ICD-10-CM

## 2014-04-21 MED ORDER — OXYCODONE HCL 5 MG PO CAPS: 5.0000 mg | ORAL_CAPSULE | ORAL | Status: DC | PRN

## 2014-05-04 ENCOUNTER — Ambulatory Visit: Payer: BC Managed Care – PPO | Admitting: Radiation Oncology

## 2014-05-14 ENCOUNTER — Other Ambulatory Visit: Payer: Self-pay | Admitting: Oncology

## 2014-05-16 ENCOUNTER — Encounter (HOSPITAL_COMMUNITY): Payer: Self-pay

## 2014-05-16 ENCOUNTER — Ambulatory Visit (HOSPITAL_COMMUNITY)
Admission: RE | Admit: 2014-05-16 | Discharge: 2014-05-16 | Disposition: A | Discharge status: Home / Self Care | Payer: BC Managed Care – PPO | Source: Ambulatory Visit | Attending: Oncology | Admitting: Oncology

## 2014-05-16 ENCOUNTER — Other Ambulatory Visit (HOSPITAL_BASED_OUTPATIENT_CLINIC_OR_DEPARTMENT_OTHER): Payer: BC Managed Care – PPO

## 2014-05-16 DIAGNOSIS — C649 Malignant neoplasm of unspecified kidney, except renal pelvis: Secondary | ICD-10-CM | POA: Insufficient documentation

## 2014-05-16 LAB — COMPREHENSIVE METABOLIC PANEL (CC13)
ALT: 47 U/L (ref 0–55)
AST: 25 U/L (ref 5–34)
Albumin: 4.1 g/dL (ref 3.5–5.0)
Alkaline Phosphatase: 96 U/L (ref 40–150)
Anion Gap: 13 mEq/L — ABNORMAL HIGH (ref 3–11)
BUN: 10 mg/dL (ref 7.0–26.0)
CO2: 26 mEq/L (ref 22–29)
Calcium: 10 mg/dL (ref 8.4–10.4)
Chloride: 102 mEq/L (ref 98–109)
Creatinine: 1.3 mg/dL (ref 0.7–1.3)
Glucose: 102 mg/dl (ref 70–140)
Potassium: 4.3 mEq/L (ref 3.5–5.1)
Sodium: 140 mEq/L (ref 136–145)
Total Bilirubin: 0.62 mg/dL (ref 0.20–1.20)
Total Protein: 8.4 g/dL — ABNORMAL HIGH (ref 6.4–8.3)

## 2014-05-16 LAB — CBC WITH DIFFERENTIAL/PLATELET
BASO%: 1.1 % (ref 0.0–2.0)
Basophils Absolute: 0.1 10*3/uL (ref 0.0–0.1)
EOS%: 2.2 % (ref 0.0–7.0)
Eosinophils Absolute: 0.1 10*3/uL (ref 0.0–0.5)
HCT: 50.3 % — ABNORMAL HIGH (ref 38.4–49.9)
HGB: 16.6 g/dL (ref 13.0–17.1)
LYMPH%: 24.5 % (ref 14.0–49.0)
MCH: 35.5 pg — ABNORMAL HIGH (ref 27.2–33.4)
MCHC: 33 g/dL (ref 32.0–36.0)
MCV: 107.8 fL — ABNORMAL HIGH (ref 79.3–98.0)
MONO#: 0.5 10*3/uL (ref 0.1–0.9)
MONO%: 7.6 % (ref 0.0–14.0)
NEUT#: 4 10*3/uL (ref 1.5–6.5)
NEUT%: 64.6 % (ref 39.0–75.0)
Platelets: 156 10*3/uL (ref 140–400)
RBC: 4.67 10*6/uL (ref 4.20–5.82)
RDW: 14.8 % — ABNORMAL HIGH (ref 11.0–14.6)
WBC: 6.2 10*3/uL (ref 4.0–10.3)
lymph#: 1.5 10*3/uL (ref 0.9–3.3)

## 2014-05-16 MED ORDER — IOHEXOL 300 MG/ML  SOLN
100.0000 mL | Freq: Once | INTRAMUSCULAR | Status: AC | PRN
  Administered 2014-05-16: 100 mL via INTRAVENOUS

## 2014-05-18 ENCOUNTER — Ambulatory Visit: Payer: BC Managed Care – PPO | Admitting: Oncology

## 2014-05-23 ENCOUNTER — Encounter: Payer: Self-pay | Admitting: Oncology

## 2014-05-23 ENCOUNTER — Ambulatory Visit (HOSPITAL_BASED_OUTPATIENT_CLINIC_OR_DEPARTMENT_OTHER): Payer: BC Managed Care – PPO | Admitting: Oncology

## 2014-05-23 ENCOUNTER — Telehealth: Payer: Self-pay | Admitting: Oncology

## 2014-05-23 VITALS — BP 137/102 | HR 85 | Temp 98.4°F | Resp 20 | Ht 74.0 in | Wt 215.6 lb

## 2014-05-23 DIAGNOSIS — C649 Malignant neoplasm of unspecified kidney, except renal pelvis: Secondary | ICD-10-CM

## 2014-05-23 DIAGNOSIS — C50919 Malignant neoplasm of unspecified site of unspecified female breast: Secondary | ICD-10-CM

## 2014-05-23 DIAGNOSIS — C779 Secondary and unspecified malignant neoplasm of lymph node, unspecified: Secondary | ICD-10-CM

## 2014-05-23 DIAGNOSIS — C7800 Secondary malignant neoplasm of unspecified lung: Secondary | ICD-10-CM

## 2014-05-23 DIAGNOSIS — G609 Hereditary and idiopathic neuropathy, unspecified: Secondary | ICD-10-CM

## 2014-05-23 NOTE — Progress Notes (Signed)
Hematology and Oncology Follow Up Visit  Keith [name] [id number] 04/23/1966 48 y.o. 05/23/2014 8:47 AM  [name], MD  [name], M.D.  [name], MD  [name], MD    Principle Diagnosis: Keith is a 48 year old [name] with stage IV renal cell carcinoma diagnosed in 2009.  Prior Therapy: 1. Status post laparoscopic radical nephrectomy.  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  2. [name] status post thoracotomy for a synchronous metastatic lung lesions done October 2009.  He had a lower lobe nodule, biopsy proven to be metastatic renal cell carcinoma.   3. [name] is status post stereotactic radiotherapy to pulmonary nodules in May of 2010. 4. He is S/P Sutent 50 mg 4 weeks on 2 weeks off from November of 2011 to 03/2013. He progressed at that time.  5. He is S/P radiation to Keith right sacral bone between 4/22 to 4/30.  6. He is S/P XRT to Keith left shoulder 03/20/14 to 03/31/14.   Current therapy: He is on Votrient 800 mg since 03/2013. His disease include lung lesions, pancreatic as well as right sacral lesion.   Interim History:  Keith [name] presents today for a followup visit.  He has tolerated Votrient very well without any new complications Keith month. He has not reported any nausea or vomiting. He did report constipation rather than diarrhea.. He reports pain that is rather diffuse in nature and not very specific.  He is reporting more cervical spine pain with radiating into his chest. He is reporting  neuropathic pain to his bilateral legs which is improved by increasing Keith Cymbalta dose 120 mg daily.  Has not reported any other complications. He is not reporting any neurological symptoms. He reports pain to left arm has improved. Has not reported any recent hospitalizations or illnesses. He did not report any headaches blurred vision or double vision. Does not report any seizure activity or alteration of mental status. Is not reporting  any chest pain or difficulty breathing. Is not reporting any cough or hemoptysis. Does not report any abdominal pain or hematochezia. Does not report any hematuria but does report occasional hesitancy and nocturia. He does not report any rashes or lesions or petechiae. He does not report any lymphadenopathy. Rest of his review of system is unremarkable.  Medications: I have reviewed Keith [name]'s current medications.  Current Outpatient Prescriptions  Medication Sig Dispense Refill  . ALPRAZolam (XANAX) 1 MG tablet TAKE 1 TABLET EVERY 8 HOURS AS NEEDED FOR ANXIETY  50 tablet  0  . DULoxetine (CYMBALTA) 60 MG capsule Take 1 capsule (60 mg total) by mouth 2 (two) times daily.  60 capsule  2  . ibuprofen (ADVIL,MOTRIN) 800 MG tablet Take 800 mg by mouth every 8 (eight) hours as needed.      [name] oxycodone (OXY-IR) 5 MG capsule Take 1-3 capsules (5-15 mg total) by mouth every 4 (four) hours as needed.  120 capsule  0  . pazopanib (VOTRIENT) 200 MG tablet Take 4 tablets (800 mg total) by mouth every morning. Take on an empty stomach.  120 tablet  0   No current facility-administered medications for Keith visit.     Allergies:  Allergies  Allergen Reactions  . Ceftriaxone Hives  . Hydrocodone Swelling    Past Medical History, Surgical history, Social history, and Family History were reviewed and updated.   Physical Exam: Blood pressure 137/102, pulse 85, temperature 98.4 F (36.9 C), temperature source Oral, resp. rate  20, height 6\' 2"  (1.88 m), weight 215 lb 9.6 oz (97.796 kg). ECOG: 1 General appearance: alert and awake not in any distress Head: Normocephalic, without obvious abnormality.  Neck: no adenopathy, no masses. No rash or tenderness on palpation. Lymph nodes: Cervical, supraclavicular, and axillary nodes normal. Heart:regular rate and rhythm, S1, S2.  Lung:chest clear, no wheezing, rales, normal symmetric air entry. No dullness to percussion. No chest wall pain noted Keith  time. Abdomen: soft, non-tender, without masses or organomegaly EXT:no edema, no despumation. No erythema noted. Tender to touch at Keith ankle and shin area on both sides.   Neuro: no focal deficits noted. No weakness noted in his LE or UE.  Lab Results: Lab Results  Component Value Date   WBC 6.2 05/16/2014   HGB 16.6 05/16/2014   HCT 50.3* 05/16/2014   MCV 107.8* 05/16/2014   PLT 156 05/16/2014    EXAM:  CT CHEST, ABDOMEN, AND PELVIS WITH CONTRAST  TECHNIQUE:  Multidetector CT imaging of Keith chest, abdomen and pelvis was  performed following Keith standard protocol during bolus  administration of intravenous contrast.  CONTRAST: 115mL OMNIPAQUE IOHEXOL 300 MG/ML SOLN  COMPARISON: January 20, 2014 CT scan ; September 20, 2013 CT scan  FINDINGS:  CT CHEST FINDINGS  Thoracic inlet is normal. Thyroid is normal. There is no significant  mediastinal or left hilar adenopathy. There are no pleural  effusions. Keith left lung is clear. Old superior posterior right rib  fracture, healed, is stable.  Architectural distortion right upper lobe is stable. Architectural  distortion medial right lower lobe is stable. Again identified is a  right perihilar lesion seen anterior to Keith right hilum, today  measuring 24 x 20 mm, previously measuring 21 x 17 mm. On image  number 26 there is a 4 mm pulmonary nodule laterally in Keith right  upper lobe which is stable from January 20, 2014.  CT ABDOMEN AND PELVIS FINDINGS  Keith liver, gallbladder, spleen, and adrenal glands are normal. A  tiny low-attenuation lesion lower pole right kidney is well below a  cm in size and too small to characterize, but stable. Keith left  kidney is surgically absent.  There is no significant mesenteric or retroperitoneal adenopathy.  There is no ascites. Keith abdominal aorta is not dilated.  There is a nonobstructive bowel gas pattern. Bowel and appendix are  normal. Bladder and reproductive organs are normal. There are no   acute or concerning musculoskeletal abnormalities in Keith abdomen and  pelvis.  There are multiple hypervascular pancreatic lesions. Most inferiorly  in Keith pancreatic head, there is a 12 x 10 mm enhancing lesion that  previously measured 16 x 12 mm. More proximally in Keith neck of Keith  pancreas, there is an enhancing lesion that measures 12 x 9 mm as  compared to 16 x 12 mm previously. In Keith body of Keith pancreas there  is a hyper attenuating 28 x 17 mm lesion that previously measured 30  x 17 mm. There are multiple dilatations of Keith pancreatic duct, up  to a diameter of 11 mm, similar to Keith prior study.  IMPRESSION:  1. Slight interval increase in Keith size of Keith right lung lesion  from 21 x 17 to 24 x 20 mm.  2. Numerous hypervascular pancreatic lesions, all showing interval  mild decrease in size. Similar associated pancreatic ductal  dilatation.  Impression and Plan:  Keith is a pleasant 48 year old [name] with Keith following issues. 1. Metastatic renal cell carcinoma.  He has documented disease to Keith lung and Keith pancreas.  He tolerating Votrient without complications. CT scan from 05/16/2014 was reviewed showed mostly stable disease. His pancreatic lesion are slightly decreased but his lung nodule slightly increased to 24 x 20 mm but still does not warrant changing his medication. Keith plan is to continue Keith current medication without any change. Repeat CT scan in September of  2015. 2. Pancreatic thickening/mass.  Keith has been biopsy proven to be a renal cell metastasis. No change in treatment at Keith time. These lesions appears to be responding to Keith current medication. 3. Depression/Anxiety: He remains on Xanax and Cymbalta. His blood appears to be stable. 4. Left arm pain: MRI of Keith left humerus showed a slightly enlarged lesion based on Keith MRI that was done on 02/27/2014. He is S/P XRT to Keith area with improvement in his pain. 5. Sacral mass: S/P radiation. No pain  reported.  6. Neuropathic pain. Appears to have responded to increased Cymbalta to 120 mg daily. He is reporting more neck pain at Keith time I will repeat MRI of Keith cervical spine to rule out any bony metastasis. If he does indeed have any cervical lesions we'll consider radiation to that area as well. 7. Follow up: 4 weeks.  [name]

## 2014-05-23 NOTE — Telephone Encounter (Signed)
gv adn printed appt sched and avs for pt for July  °

## 2014-05-23 NOTE — Progress Notes (Signed)
Patient requesting office notes, scan notes and doctor's notes to be faxed to [facility]. Request given to Rhys Martini, HIM Lead, to process.

## 2014-05-25 ENCOUNTER — Encounter: Payer: Self-pay | Admitting: Radiation Oncology

## 2014-05-25 ENCOUNTER — Ambulatory Visit
Admission: RE | Admit: 2014-05-25 | Discharge: 2014-05-25 | Disposition: A | Discharge status: Home / Self Care | Payer: BC Managed Care – PPO | Source: Ambulatory Visit | Attending: Radiation Oncology | Admitting: Radiation Oncology

## 2014-05-25 VITALS — BP 139/98 | HR 77 | Resp 16 | Wt 217.1 lb

## 2014-05-25 DIAGNOSIS — C419 Malignant neoplasm of bone and articular cartilage, unspecified: Secondary | ICD-10-CM

## 2014-05-25 DIAGNOSIS — C7951 Secondary malignant neoplasm of bone: Secondary | ICD-10-CM

## 2014-05-25 DIAGNOSIS — C7952 Secondary malignant neoplasm of bone marrow: Secondary | ICD-10-CM

## 2014-05-25 MED ORDER — OXYCODONE HCL 5 MG PO CAPS: 5.0000 mg | ORAL_CAPSULE | ORAL | Status: DC | PRN

## 2014-05-25 NOTE — Progress Notes (Signed)
Patient seen by [facility] recently and understand spot in lung has slightly increased in size but, pancreas is smaller. MRI scheduled for Thursday of next week. Weight stable. Working out with weights three times per week. Reports cervical neck pain constant 8 on a scale of 0-10 x 3 months. Denies pain in left humerus. Requesting oxy IR refill. Diastolic bp elevated.

## 2014-05-25 NOTE — Progress Notes (Signed)
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 May 2011 - stereotactic body radiotherapy to three isolated pulmonary metastases (1.2 cm in the right upper lobe, 5 mm in the right upper lobe and 1 cm in the right lung base) to 50 Gy in five fractions  2. 4/22-30/2014 isolated right sacral oligometastatic deposit to 50 gray in 5 fractions  3.  03/20/2014-03/31/2014 The patient's left shoulder was treated to 30 gray in 10 fractions   Interval Since Last Radiation:  4  weeks  Narrative:  The patient returns today for routine follow-up.  Patient seen by [facility] recently and understand spot in lung has slightly increased in size but, pancreas is smaller. MRI scheduled for Thursday of next week. Weight stable. Working out with weights three times per week. Reports cervical neck pain constant 8 on a scale of 0-10 x 3 months. Denies pain in left humerus. Requesting oxy IR refill. Diastolic bp elevated                             ALLERGIES:  is allergic to ceftriaxone and hydrocodone.  Meds: Current Outpatient Prescriptions  Medication Sig Dispense Refill  . ALPRAZolam (XANAX) 1 MG tablet TAKE 1 TABLET EVERY 8 HOURS AS NEEDED FOR ANXIETY  50 tablet  0  . DULoxetine (CYMBALTA) 60 MG capsule Take 1 capsule (60 mg total) by mouth 2 (two) times daily.  60 capsule  2  . oxycodone (OXY-IR) 5 MG capsule Take 1-3 capsules (5-15 mg total) by mouth every 4 (four) hours as needed.  120 capsule  0  . pazopanib (VOTRIENT) 200 MG tablet Take 4 tablets (800 mg total) by mouth every morning. Take on an empty stomach.  120 tablet  0  . ibuprofen (ADVIL,MOTRIN) 800 MG tablet Take 800 mg by mouth every 8 (eight) hours as needed.       No current  facility-administered medications for this encounter.    Physical Findings: The patient is in no acute distress. Patient is alert and oriented.  weight is 217 lb 1.6 oz (98.476 kg). His blood pressure is 139/98 and his pulse is 77. His respiration is 16. .  No significant changes.  Lab Findings: Lab Results  Component Value Date   WBC 6.2 05/16/2014   HGB 16.6 05/16/2014   HCT 50.3* 05/16/2014   MCV 107.8* 05/16/2014   PLT 156 05/16/2014    @LASTCHEM @  Radiographic Findings: Ct Chest W Contrast  05/16/2014   CLINICAL DATA:  Metastatic renal cell carcinoma for restaging  EXAM: CT CHEST, ABDOMEN, AND PELVIS WITH CONTRAST  TECHNIQUE: Multidetector CT imaging of the chest, abdomen and pelvis was performed following the standard protocol during bolus administration of intravenous contrast.  CONTRAST:  155mL OMNIPAQUE IOHEXOL 300 MG/ML  SOLN  COMPARISON:  January 20, 2014 CT scan ; September 20, 2013 CT scan  FINDINGS: CT CHEST FINDINGS  Thoracic inlet is normal. Thyroid is normal. There is no significant mediastinal or left hilar adenopathy. There are no pleural effusions. The left lung is clear. Old superior posterior right rib fracture, healed, is stable.  Architectural distortion right upper lobe is stable. Architectural distortion medial right lower lobe  is stable. Again identified is a right perihilar lesion seen anterior to the right hilum, today measuring 24 x 20 mm, previously measuring 21 x 17 mm. On image number 26 there is a 4 mm pulmonary nodule laterally in the right upper lobe which is stable from January 20, 2014.  CT ABDOMEN AND PELVIS FINDINGS  The liver, gallbladder, spleen, and adrenal glands are normal. A tiny low-attenuation lesion lower pole right kidney is well below a cm in size and too small to characterize, but stable. The left kidney is surgically absent.  There is no significant mesenteric or retroperitoneal adenopathy. There is no ascites. The abdominal aorta is not dilated.   There is a nonobstructive bowel gas pattern. Bowel and appendix are normal. Bladder and reproductive organs are normal. There are no acute or concerning musculoskeletal abnormalities in the abdomen and pelvis.  There are multiple hypervascular pancreatic lesions. Most inferiorly in the pancreatic head, there is a 12 x 10 mm enhancing lesion that previously measured 16 x 12 mm. More proximally in the neck of the pancreas, there is an enhancing lesion that measures 12 x 9 mm as compared to 16 x 12 mm previously. In the body of the pancreas there is a hyper attenuating 28 x 17 mm lesion that previously measured 30 x 17 mm. There are multiple dilatations of the pancreatic duct, up to a diameter of 11 mm, similar to the prior study.  IMPRESSION: 1. Slight interval increase in the size of the right lung lesion from 21 x 17 to 24 x 20 mm. 2. Numerous hypervascular pancreatic lesions, all showing interval mild decrease in size. Similar associated pancreatic ductal dilatation.   Electronically Signed   By: Skipper Cliche M.D.   On: 05/16/2014 09:56   Ct Abdomen Pelvis W Contrast  05/16/2014   CLINICAL DATA:  Metastatic renal cell carcinoma for restaging  EXAM: CT CHEST, ABDOMEN, AND PELVIS WITH CONTRAST  TECHNIQUE: Multidetector CT imaging of the chest, abdomen and pelvis was performed following the standard protocol during bolus administration of intravenous contrast.  CONTRAST:  141mL OMNIPAQUE IOHEXOL 300 MG/ML  SOLN  COMPARISON:  January 20, 2014 CT scan ; September 20, 2013 CT scan  FINDINGS: CT CHEST FINDINGS  Thoracic inlet is normal. Thyroid is normal. There is no significant mediastinal or left hilar adenopathy. There are no pleural effusions. The left lung is clear. Old superior posterior right rib fracture, healed, is stable.  Architectural distortion right upper lobe is stable. Architectural distortion medial right lower lobe is stable. Again identified is a right perihilar lesion seen anterior to the right  hilum, today measuring 24 x 20 mm, previously measuring 21 x 17 mm. On image number 26 there is a 4 mm pulmonary nodule laterally in the right upper lobe which is stable from January 20, 2014.  CT ABDOMEN AND PELVIS FINDINGS  The liver, gallbladder, spleen, and adrenal glands are normal. A tiny low-attenuation lesion lower pole right kidney is well below a cm in size and too small to characterize, but stable. The left kidney is surgically absent.  There is no significant mesenteric or retroperitoneal adenopathy. There is no ascites. The abdominal aorta is not dilated.  There is a nonobstructive bowel gas pattern. Bowel and appendix are normal. Bladder and reproductive organs are normal. There are no acute or concerning musculoskeletal abnormalities in the abdomen and pelvis.  There are multiple hypervascular pancreatic lesions. Most inferiorly in the pancreatic head, there is a 12 x 10 mm  enhancing lesion that previously measured 16 x 12 mm. More proximally in the neck of the pancreas, there is an enhancing lesion that measures 12 x 9 mm as compared to 16 x 12 mm previously. In the body of the pancreas there is a hyper attenuating 28 x 17 mm lesion that previously measured 30 x 17 mm. There are multiple dilatations of the pancreatic duct, up to a diameter of 11 mm, similar to the prior study.  IMPRESSION: 1. Slight interval increase in the size of the right lung lesion from 21 x 17 to 24 x 20 mm. 2. Numerous hypervascular pancreatic lesions, all showing interval mild decrease in size. Similar associated pancreatic ductal dilatation.   Electronically Signed   By: Skipper Cliche M.D.   On: 05/16/2014 09:56    Impression:  The patient is recovering from the effects of radiation.  His humerus pain has improved.  Plan:  He has an MRI for neck pain next week.  Will follow-up as needed.  Refilled pain med.  _____________________________________  Sheral Apley. Tammi Klippel, M.D.

## 2014-05-31 ENCOUNTER — Ambulatory Visit (HOSPITAL_COMMUNITY)
Admission: RE | Admit: 2014-05-31 | Discharge: 2014-05-31 | Disposition: A | Discharge status: Home / Self Care | Payer: BC Managed Care – PPO | Source: Ambulatory Visit | Attending: Oncology | Admitting: Oncology

## 2014-05-31 ENCOUNTER — Other Ambulatory Visit: Payer: Self-pay | Admitting: Oncology

## 2014-05-31 DIAGNOSIS — C649 Malignant neoplasm of unspecified kidney, except renal pelvis: Secondary | ICD-10-CM

## 2014-05-31 DIAGNOSIS — M5380 Other specified dorsopathies, site unspecified: Secondary | ICD-10-CM | POA: Insufficient documentation

## 2014-05-31 DIAGNOSIS — M4802 Spinal stenosis, cervical region: Secondary | ICD-10-CM | POA: Insufficient documentation

## 2014-05-31 DIAGNOSIS — M47812 Spondylosis without myelopathy or radiculopathy, cervical region: Secondary | ICD-10-CM | POA: Insufficient documentation

## 2014-05-31 DIAGNOSIS — M5030 Other cervical disc degeneration, unspecified cervical region: Secondary | ICD-10-CM | POA: Insufficient documentation

## 2014-05-31 DIAGNOSIS — M542 Cervicalgia: Secondary | ICD-10-CM | POA: Insufficient documentation

## 2014-05-31 MED ORDER — GADOBENATE DIMEGLUMINE 529 MG/ML IV SOLN
20.0000 mL | Freq: Once | INTRAVENOUS | Status: AC | PRN
  Administered 2014-05-31: 20 mL via INTRAVENOUS

## 2014-06-21 ENCOUNTER — Other Ambulatory Visit (HOSPITAL_BASED_OUTPATIENT_CLINIC_OR_DEPARTMENT_OTHER): Payer: BC Managed Care – PPO

## 2014-06-21 ENCOUNTER — Ambulatory Visit (HOSPITAL_BASED_OUTPATIENT_CLINIC_OR_DEPARTMENT_OTHER): Payer: BC Managed Care – PPO | Admitting: Physician Assistant

## 2014-06-21 ENCOUNTER — Encounter: Payer: Self-pay | Admitting: Physician Assistant

## 2014-06-21 VITALS — BP 137/94 | HR 77 | Temp 97.0°F | Resp 18 | Ht 75.0 in | Wt 213.5 lb

## 2014-06-21 DIAGNOSIS — F411 Generalized anxiety disorder: Secondary | ICD-10-CM

## 2014-06-21 DIAGNOSIS — C7952 Secondary malignant neoplasm of bone marrow: Secondary | ICD-10-CM

## 2014-06-21 DIAGNOSIS — C649 Malignant neoplasm of unspecified kidney, except renal pelvis: Secondary | ICD-10-CM

## 2014-06-21 DIAGNOSIS — C419 Malignant neoplasm of bone and articular cartilage, unspecified: Secondary | ICD-10-CM

## 2014-06-21 DIAGNOSIS — C7800 Secondary malignant neoplasm of unspecified lung: Secondary | ICD-10-CM

## 2014-06-21 DIAGNOSIS — C7951 Secondary malignant neoplasm of bone: Secondary | ICD-10-CM

## 2014-06-21 DIAGNOSIS — F329 Major depressive disorder, single episode, unspecified: Secondary | ICD-10-CM

## 2014-06-21 DIAGNOSIS — C7889 Secondary malignant neoplasm of other digestive organs: Secondary | ICD-10-CM

## 2014-06-21 DIAGNOSIS — F3289 Other specified depressive episodes: Secondary | ICD-10-CM

## 2014-06-21 LAB — CBC WITH DIFFERENTIAL/PLATELET
BASO%: 1 % (ref 0.0–2.0)
Basophils Absolute: 0.1 10*3/uL (ref 0.0–0.1)
EOS%: 6.7 % (ref 0.0–7.0)
Eosinophils Absolute: 0.4 10*3/uL (ref 0.0–0.5)
HCT: 45.5 % (ref 38.4–49.9)
HGB: 15.2 g/dL (ref 13.0–17.1)
LYMPH%: 31.7 % (ref 14.0–49.0)
MCH: 35.9 pg — ABNORMAL HIGH (ref 27.2–33.4)
MCHC: 33.5 g/dL (ref 32.0–36.0)
MCV: 107.2 fL — ABNORMAL HIGH (ref 79.3–98.0)
MONO#: 0.4 10*3/uL (ref 0.1–0.9)
MONO%: 6.7 % (ref 0.0–14.0)
NEUT#: 3.6 10*3/uL (ref 1.5–6.5)
NEUT%: 53.9 % (ref 39.0–75.0)
Platelets: 167 10*3/uL (ref 140–400)
RBC: 4.25 10*6/uL (ref 4.20–5.82)
RDW: 14.8 % — ABNORMAL HIGH (ref 11.0–14.6)
WBC: 6.7 10*3/uL (ref 4.0–10.3)
lymph#: 2.1 10*3/uL (ref 0.9–3.3)

## 2014-06-21 LAB — COMPREHENSIVE METABOLIC PANEL (CC13)
ALT: 52 U/L (ref 0–55)
AST: 31 U/L (ref 5–34)
Albumin: 3.7 g/dL (ref 3.5–5.0)
Alkaline Phosphatase: 93 U/L (ref 40–150)
Anion Gap: 9 mEq/L (ref 3–11)
BUN: 12.4 mg/dL (ref 7.0–26.0)
CO2: 28 mEq/L (ref 22–29)
Calcium: 9.4 mg/dL (ref 8.4–10.4)
Chloride: 102 mEq/L (ref 98–109)
Creatinine: 1.3 mg/dL (ref 0.7–1.3)
Glucose: 117 mg/dl (ref 70–140)
Potassium: 4.2 mEq/L (ref 3.5–5.1)
Sodium: 139 mEq/L (ref 136–145)
Total Bilirubin: 0.59 mg/dL (ref 0.20–1.20)
Total Protein: 7.5 g/dL (ref 6.4–8.3)

## 2014-06-21 MED ORDER — OXYCODONE HCL 5 MG PO CAPS: ORAL_CAPSULE | ORAL | Status: DC

## 2014-06-21 MED ORDER — ALPRAZOLAM 1 MG PO TABS: ORAL_TABLET | ORAL | Status: DC

## 2014-06-21 MED ORDER — OXYCODONE HCL 5 MG PO CAPS: 5.0000 mg | ORAL_CAPSULE | ORAL | Status: DC | PRN

## 2014-06-21 NOTE — Progress Notes (Signed)
Hematology and Oncology Follow Up Visit  Keith [name] [id number] [birth year] 48 y.o. 06/21/2014 2:24 PM  Keith Bring, MD  Keith [name], M.D.  Keith Bent, MD  Keith Banister, MD    Principle Diagnosis: This is a 48 year old gentleman with stage IV renal cell carcinoma diagnosed in 2009.  Prior Therapy: 1. Status post laparoscopic radical nephrectomy.  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  2. Patient status post thoracotomy for a synchronous metastatic lung lesions done October 2009.  He had a lower lobe nodule, biopsy proven to be metastatic renal cell carcinoma.   3. Patient is status post stereotactic radiotherapy to pulmonary nodules in May of 2010. 4. He is S/P Sutent 50 mg 4 weeks on 2 weeks off from November of 2011 to 03/2013. He progressed at that time.  5. He is S/P radiation to the right sacral bone between 4/22 to 4/30.  6. He is S/P XRT to the left shoulder 03/20/14 to 03/31/14.   Current therapy: He is on Votrient 800 mg since 03/2013. His disease include lung lesions, pancreatic as well as right sacral lesion.   Interim History:  Keith [name] presents today for a followup visit.  He has tolerated Votrient relatively well. He does report diarrhea approximately one hour after taking the Motrin but this is well-controlled with Imodium. He complains of right hip and pelvis pain. He previously had some radiation therapy to this area. He requests a refill for his Xanax as well as for his pain pills. He denies any problems with fever, chills, nausea, vomiting or constipation. Denied chest pain, shortness of breath, cough or hemoptysis. He's had no night sweats or significant weight loss.  Has not reported any other complications. He is not reporting any neurological symptoms. He reports pain to left arm has improved. Has not reported any recent hospitalizations or illnesses. He did not report any headaches blurred vision or double vision. Does not  report any seizure activity or alteration of mental status. Is not reporting any chest pain or difficulty breathing. Is not reporting any cough or hemoptysis. Does not report any abdominal pain or hematochezia. Does not report any hematuria but does report occasional hesitancy and nocturia. He does not report any rashes or lesions or petechiae. He does not report any lymphadenopathy. Rest of his review of system is unremarkable.  Medications: I have reviewed the patient's current medications.  Current Outpatient Prescriptions  Medication Sig Dispense Refill  . ALPRAZolam (XANAX) 1 MG tablet TAKE 1 TABLET EVERY 8 HOURS AS NEEDED FOR ANXIETY  50 tablet  0  . DULoxetine (CYMBALTA) 60 MG capsule Take 1 capsule (60 mg total) by mouth 2 (two) times daily.  60 capsule  2  . ibuprofen (ADVIL,MOTRIN) 800 MG tablet Take 800 mg by mouth every 8 (eight) hours as needed.      . pazopanib (VOTRIENT) 200 MG tablet Take 4 tablets (800 mg total) by mouth every morning. Take on an empty stomach.  120 tablet  0  . oxycodone (OXY-IR) 5 MG capsule Take 1 or 2 tablets by mouth every 6 hours as needed for pain  60 capsule  0   No current facility-administered medications for this visit.     Allergies:  Allergies  Allergen Reactions  . Ceftriaxone Hives  . Hydrocodone Swelling    Past Medical History, Surgical history, Social history, and Family History were reviewed and updated.   Physical Exam: Blood pressure 137/94, pulse 77, temperature  60 F (36.1 C), temperature source Oral, resp. rate 18, height 6\' 3"  (1.905 m), weight 213 lb 8 oz (96.843 kg). ECOG: 1 General appearance: alert and awake not in any distress Head: Normocephalic, without obvious abnormality.  Neck: no adenopathy, no masses. No rash or tenderness on palpation. Lymph nodes: Cervical, supraclavicular, and axillary nodes normal. Heart:regular rate and rhythm, S1, S2.  Lung:chest clear, no wheezing, rales, normal symmetric air entry. No  dullness to percussion. No chest wall pain noted this time. Abdomen: soft, non-tender, without masses or organomegaly EXT:no edema, no despumation. No erythema noted. Tender to touch at the ankle and shin area on both sides.   Neuro: no focal deficits noted. No weakness noted in his LE or UE.  Lab Results: Lab Results  Component Value Date   WBC 6.7 06/21/2014   HGB 15.2 06/21/2014   HCT 45.5 06/21/2014   MCV 107.2* 06/21/2014   PLT 167 06/21/2014    EXAM:  CT CHEST, ABDOMEN, AND PELVIS WITH CONTRAST  TECHNIQUE:  Multidetector CT imaging of the chest, abdomen and pelvis was  performed following the standard protocol during bolus  administration of intravenous contrast.  CONTRAST: 164mL OMNIPAQUE IOHEXOL 300 MG/ML SOLN  COMPARISON: January 20, 2014 CT scan ; September 20, 2013 CT scan  FINDINGS:  CT CHEST FINDINGS  Thoracic inlet is normal. Thyroid is normal. There is no significant  mediastinal or left hilar adenopathy. There are no pleural  effusions. The left lung is clear. Old superior posterior right rib  fracture, healed, is stable.  Architectural distortion right upper lobe is stable. Architectural  distortion medial right lower lobe is stable. Again identified is a  right perihilar lesion seen anterior to the right hilum, today  measuring 24 x 20 mm, previously measuring 21 x 17 mm. On image  number 26 there is a 4 mm pulmonary nodule laterally in the right  upper lobe which is stable from January 20, 2014.  CT ABDOMEN AND PELVIS FINDINGS  The liver, gallbladder, spleen, and adrenal glands are normal. A  tiny low-attenuation lesion lower pole right kidney is well below a  cm in size and too small to characterize, but stable. The left  kidney is surgically absent.  There is no significant mesenteric or retroperitoneal adenopathy.  There is no ascites. The abdominal aorta is not dilated.  There is a nonobstructive bowel gas pattern. Bowel and appendix are  normal. Bladder and  reproductive organs are normal. There are no  acute or concerning musculoskeletal abnormalities in the abdomen and  pelvis.  There are multiple hypervascular pancreatic lesions. Most inferiorly  in the pancreatic head, there is a 12 x 10 mm enhancing lesion that  previously measured 16 x 12 mm. More proximally in the neck of the  pancreas, there is an enhancing lesion that measures 12 x 9 mm as  compared to 16 x 12 mm previously. In the body of the pancreas there  is a hyper attenuating 28 x 17 mm lesion that previously measured 30  x 17 mm. There are multiple dilatations of the pancreatic duct, up  to a diameter of 11 mm, similar to the prior study.  IMPRESSION:  1. Slight interval increase in the size of the right lung lesion  from 21 x 17 to 24 x 20 mm.  2. Numerous hypervascular pancreatic lesions, all showing interval  mild decrease in size. Similar associated pancreatic ductal  dilatation.  Impression and Plan:  This is a pleasant 48 year old gentleman  with the following issues. 1. Metastatic renal cell carcinoma.  He has documented disease to the lung and the pancreas.  He tolerating Votrient without complications. CT scan from 05/16/2014 showed mostly stable disease. His pancreatic lesion are slightly decreased but his lung nodule slightly increased to 24 x 20 mm but still does not warrant changing his medication. The plan is to continue the current medication without any change. Repeat CT scan in September of  2015. 2. Pancreatic thickening/mass.  This has been biopsy proven to be a renal cell metastasis. No change in treatment at this time. These lesions appears to be responding to the current medication. 3. Depression/Anxiety: He remains on Xanax and Cymbalta. His blood appears to be stable. He was given a refill prescription for his Xanax today. 4. Left arm pain: MRI of the left humerus showed a slightly enlarged lesion based on the MRI that was done on 02/27/2014. He is S/P XRT to  this area with improvement in his pain. 5. Sacral mass: S/P radiation. No pain reported.  6. Neuropathic pain. Appears to have responded to increased Cymbalta to 120 mg daily. 7. Follow up: 4 weeks. Pain management- Dr. Tammi Klippel had previously written his pain medication for 5 mg oxycodone tablets, one to 3 tablets every 4 hours for total 120 tablets. Patient was reviewed with Dr. [name]. I have written a prescription for his oxycodone tablets 5 mg tablets, one to 2 tablets by mouth every 6 hours a total of 60 tablets. His pain medication requirements will be reviewed when he followup with Dr. [name] in one month.  Wynetta Emery, Suad Autrey E, PA-C

## 2014-06-23 NOTE — Patient Instructions (Signed)
Continue Votrient at the current dose Followup in one month

## 2014-06-26 ENCOUNTER — Telehealth: Payer: Self-pay | Admitting: Oncology

## 2014-06-26 NOTE — Telephone Encounter (Signed)
Lft msg for pt of apt 07/19/14 per [facility] also mailed ltr of apt. KJ

## 2014-07-01 IMAGING — CT CT ABD-PELV W/ CM
2 of 9 series · 13 of 46 positions shown, 18 images · IV contrast (OMNIPAQUE)
Comparison: 04/30/2012

CT CHEST

CLINICAL DATA: Metastatic renal cell carcinoma

CT CHEST, ABDOMEN AND PELVIS WITH CONTRAST
TECHNIQUE: Multidetector CT imaging of the chest, abdomen and
pelvis was performed following the standard protocol during bolus
administration of intravenous contrast.
Contrast: 100mL OMNIPAQUE IOHEXOL 300 MG/ML  SOLN

[Series 7: venous thins pacs · axial · portal-venous · 0.80mm/px · z∈[-648,-84]mm · 10 of 226 slices shown, 15 images]
[im 19/226  soft-tissue]
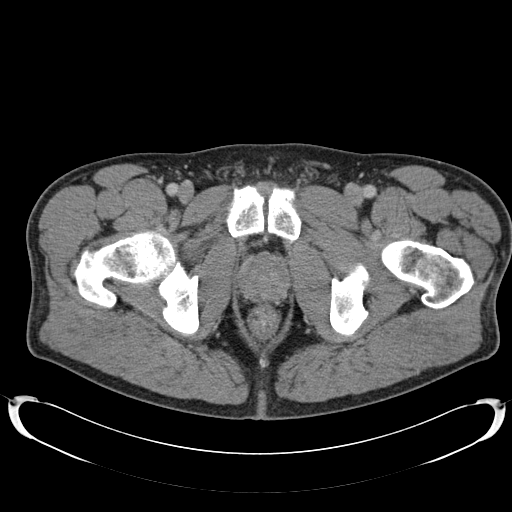
[im 19/226  bone]
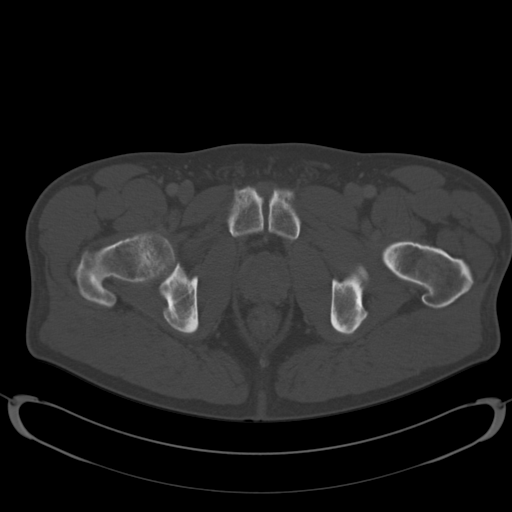
[im 38/226  soft-tissue]
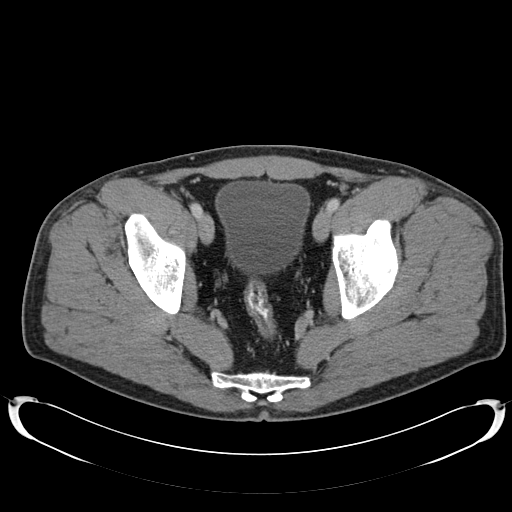
[im 76/226  soft-tissue]
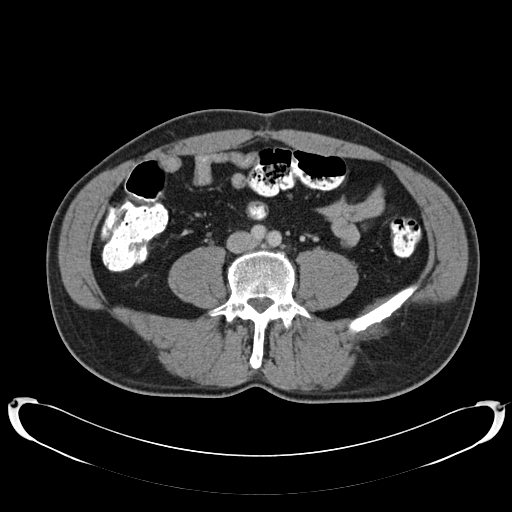
[im 94/226  soft-tissue]
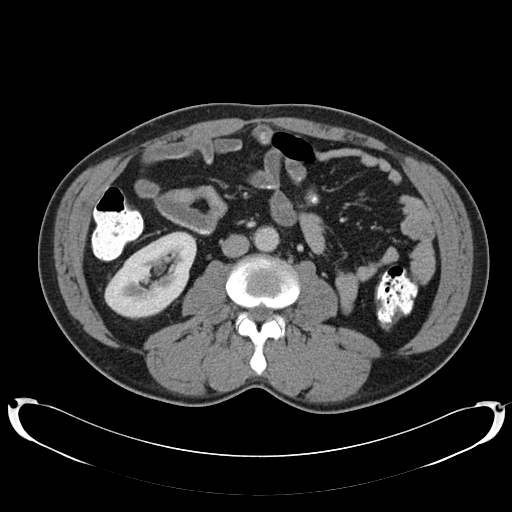
[im 113/226  soft-tissue]
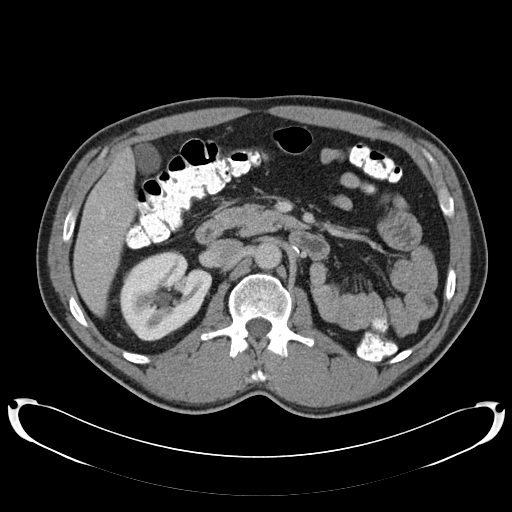
[im 132/226  soft-tissue]
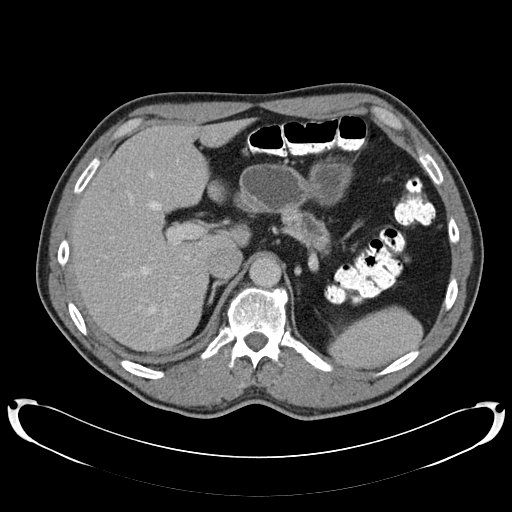
[im 151/226  soft-tissue]
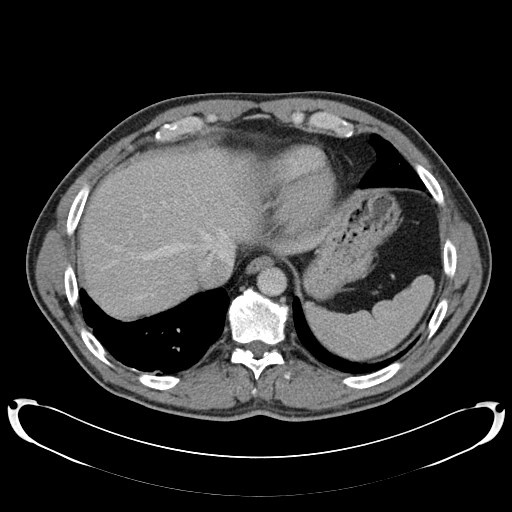
[im 151/226  lung]
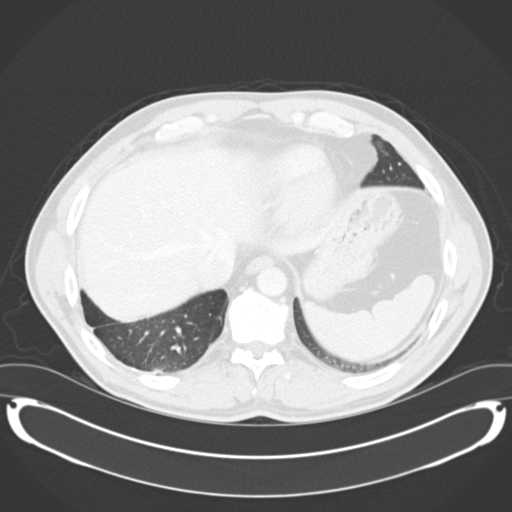
[im 169/226  lung]
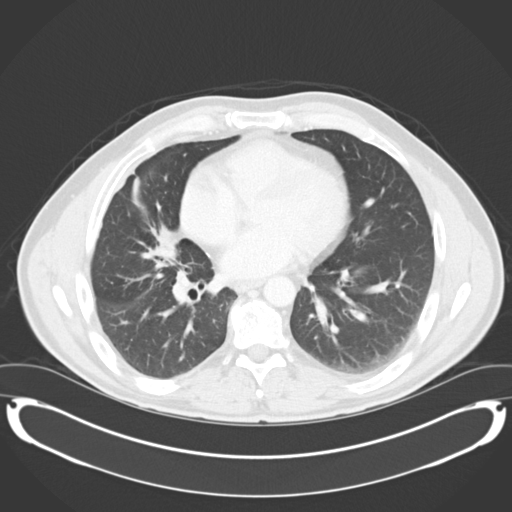
[im 188/226  soft-tissue]
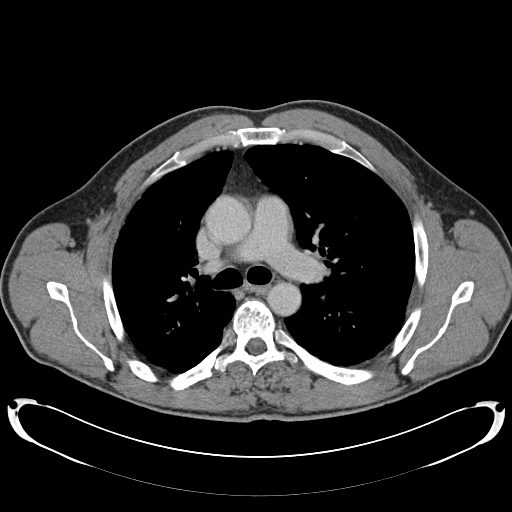
[im 188/226  lung]
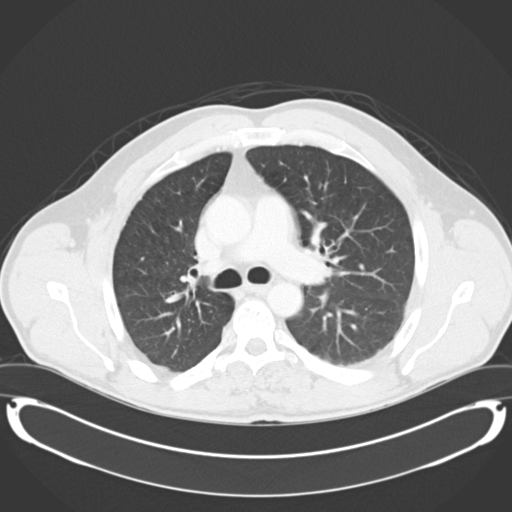
[im 207/226  soft-tissue]
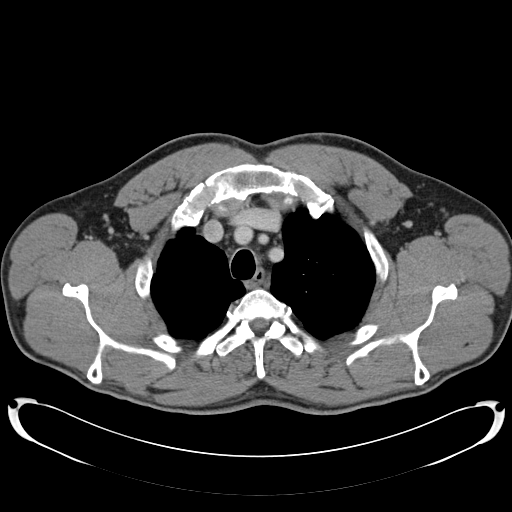
[im 207/226  lung]
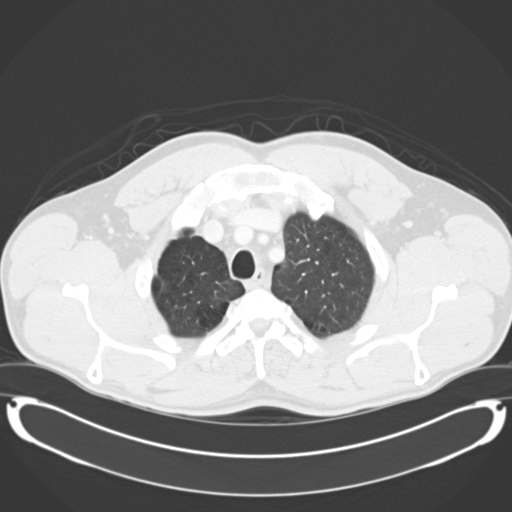
[im 207/226  bone]
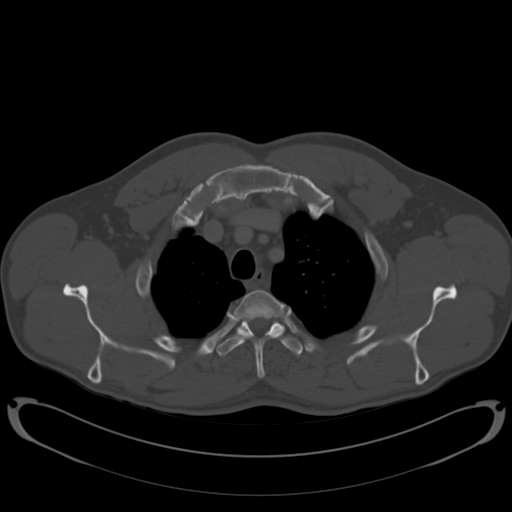

[Series 602: <mpr thick range> · coronal · 0.80mm/px · 3 of 81 slices shown]
[im 21/81  soft-tissue]
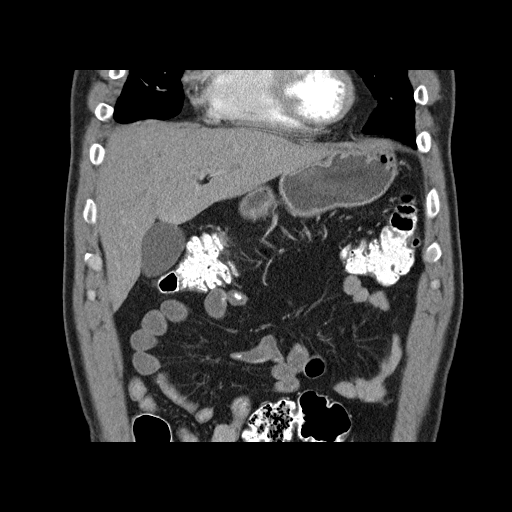
[im 41/81  soft-tissue]
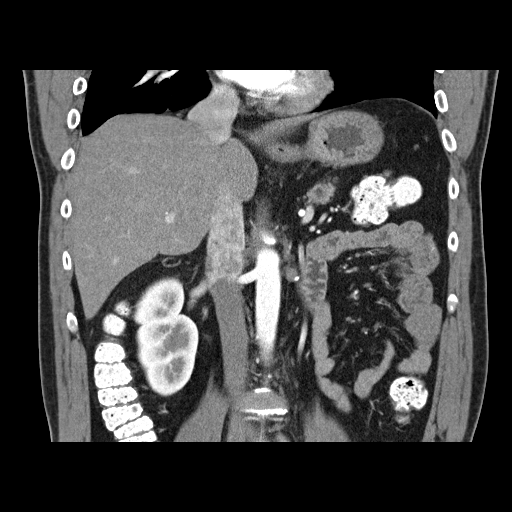
[im 61/81  soft-tissue]
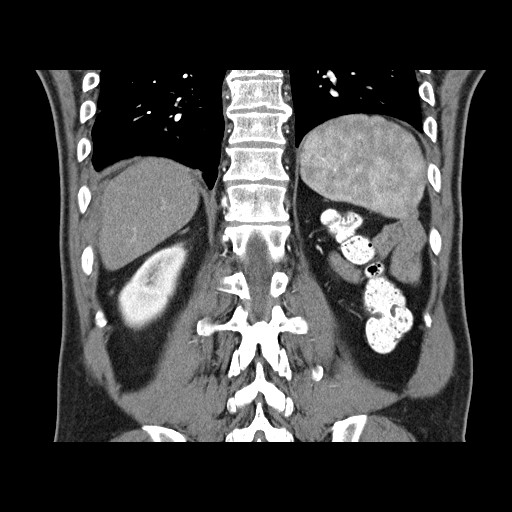

[13 of 46 positions shown; findings below may reference images not displayed]

FINDINGS: No axillary or supraclavicular adenopathy.  There is no
mediastinal or hilar adenopathy.  No pericardial or pleural
effusion identified.  Changes of external beam radiation identified
within the right lung.  Similar to previous exam.  Stable pulmonary
nodule within the right upper lobe which measures 4 mm, image 26.
Postoperative change within the right lower lobe is identified.
Moderate changes of emphysema identified.  Review of the bony
thorax shows some focal area of sclerosis involving the posterior
aspect of the right fourth rib.
IMPRESSION: Stable CT of the chest.  No specific features identified to suggest
residual or recurrence of tumor.

CT ABDOMEN AND PELVIS
FINDINGS: There are no suspicious liver abnormalities.  The
gallbladder appears normal.  No biliary dilatation.  Multiple
hyperenhancing lesions are again identified within the head and
neck of the pancreas.  Index lesion within the neck of the pancreas
measures 1.6 x 2.0 cm, image 39.  Previously 1.6 x 1.8 cm.  Index
lesion within the head of pancreas measures 1.1 cm, image 49.
Previously this measured the same.  Along the ventral surface of
the pancreatic head there is an 8 mm metastasis, image 52.
Previously 1 cm.  Increased caliber of the pancreatic duct likely
reflects mass effect from pancreatic lesions.  The spleen appears
normal.

Both adrenal glands are normal.  Normal appearance of the right
kidney.  Both adrenal glands appear normal.  Status post left
nephrectomy.  Urinary bladder is normal.  Prostate gland and
seminal vesicles appear normal.

There are no enlarged lymph nodes within the upper abdomen.  There
is no pelvic or inguinal adenopathy.

The stomach and the small bowel loops are within normal limits.
The colon is unremarkable.

No free fluid or fluid collections identified within the abdomen or
pelvis.

Review of the visualized bony structures is significant for
thoracic spondylosis.  No aggressive lytic or sclerotic bone
lesions identified.
IMPRESSION: 1.  No significant change in the appearance of multiple pancreatic
metastases.

## 2014-07-19 ENCOUNTER — Telehealth: Payer: Self-pay | Admitting: Oncology

## 2014-07-19 ENCOUNTER — Ambulatory Visit (HOSPITAL_BASED_OUTPATIENT_CLINIC_OR_DEPARTMENT_OTHER): Payer: BC Managed Care – PPO | Admitting: Oncology

## 2014-07-19 ENCOUNTER — Encounter: Payer: Self-pay | Admitting: Oncology

## 2014-07-19 ENCOUNTER — Other Ambulatory Visit (HOSPITAL_BASED_OUTPATIENT_CLINIC_OR_DEPARTMENT_OTHER): Payer: BC Managed Care – PPO

## 2014-07-19 VITALS — BP 147/101 | HR 79 | Temp 98.3°F | Resp 20 | Ht 75.0 in | Wt 214.2 lb

## 2014-07-19 DIAGNOSIS — M949 Disorder of cartilage, unspecified: Secondary | ICD-10-CM

## 2014-07-19 DIAGNOSIS — F341 Dysthymic disorder: Secondary | ICD-10-CM

## 2014-07-19 DIAGNOSIS — C419 Malignant neoplasm of bone and articular cartilage, unspecified: Secondary | ICD-10-CM

## 2014-07-19 DIAGNOSIS — G5790 Unspecified mononeuropathy of unspecified lower limb: Secondary | ICD-10-CM

## 2014-07-19 DIAGNOSIS — C649 Malignant neoplasm of unspecified kidney, except renal pelvis: Secondary | ICD-10-CM

## 2014-07-19 DIAGNOSIS — C7952 Secondary malignant neoplasm of bone marrow: Secondary | ICD-10-CM

## 2014-07-19 DIAGNOSIS — C7889 Secondary malignant neoplasm of other digestive organs: Secondary | ICD-10-CM

## 2014-07-19 DIAGNOSIS — C7951 Secondary malignant neoplasm of bone: Secondary | ICD-10-CM

## 2014-07-19 DIAGNOSIS — M899 Disorder of bone, unspecified: Secondary | ICD-10-CM

## 2014-07-19 DIAGNOSIS — C7800 Secondary malignant neoplasm of unspecified lung: Secondary | ICD-10-CM

## 2014-07-19 LAB — COMPREHENSIVE METABOLIC PANEL (CC13)
ALT: 49 U/L (ref 0–55)
AST: 36 U/L — ABNORMAL HIGH (ref 5–34)
Albumin: 3.9 g/dL (ref 3.5–5.0)
Alkaline Phosphatase: 88 U/L (ref 40–150)
Anion Gap: 10 mEq/L (ref 3–11)
BUN: 11.2 mg/dL (ref 7.0–26.0)
CO2: 26 mEq/L (ref 22–29)
Calcium: 9.5 mg/dL (ref 8.4–10.4)
Chloride: 104 mEq/L (ref 98–109)
Creatinine: 1.2 mg/dL (ref 0.7–1.3)
Glucose: 120 mg/dl (ref 70–140)
Potassium: 4 mEq/L (ref 3.5–5.1)
Sodium: 140 mEq/L (ref 136–145)
Total Bilirubin: 0.63 mg/dL (ref 0.20–1.20)
Total Protein: 7.8 g/dL (ref 6.4–8.3)

## 2014-07-19 LAB — CBC WITH DIFFERENTIAL/PLATELET
BASO%: 0.9 % (ref 0.0–2.0)
Basophils Absolute: 0.1 10*3/uL (ref 0.0–0.1)
EOS%: 5.3 % (ref 0.0–7.0)
Eosinophils Absolute: 0.3 10*3/uL (ref 0.0–0.5)
HCT: 46.6 % (ref 38.4–49.9)
HGB: 15.4 g/dL (ref 13.0–17.1)
LYMPH%: 24 % (ref 14.0–49.0)
MCH: 35.7 pg — ABNORMAL HIGH (ref 27.2–33.4)
MCHC: 33.1 g/dL (ref 32.0–36.0)
MCV: 107.9 fL — ABNORMAL HIGH (ref 79.3–98.0)
MONO#: 0.5 10*3/uL (ref 0.1–0.9)
MONO%: 7.3 % (ref 0.0–14.0)
NEUT#: 4.1 10*3/uL (ref 1.5–6.5)
NEUT%: 62.5 % (ref 39.0–75.0)
Platelets: 179 10*3/uL (ref 140–400)
RBC: 4.32 10*6/uL (ref 4.20–5.82)
RDW: 15.7 % — ABNORMAL HIGH (ref 11.0–14.6)
WBC: 6.5 10*3/uL (ref 4.0–10.3)
lymph#: 1.6 10*3/uL (ref 0.9–3.3)

## 2014-07-19 MED ORDER — ALPRAZOLAM 1 MG PO TABS: ORAL_TABLET | ORAL | Status: DC

## 2014-07-19 MED ORDER — OXYCODONE HCL 5 MG PO CAPS: ORAL_CAPSULE | ORAL | Status: DC

## 2014-07-19 NOTE — Telephone Encounter (Signed)
gv and printed appt sched and avs for pt for Aug Sept and OCT....gv pt barium

## 2014-07-19 NOTE — Progress Notes (Signed)
Hematology and Oncology Follow Up Visit  Keith [name] [id number] [birth year] 48 y.o. 07/19/2014 8:44 AM  [name], MD  [name], M.D.  [name], MD  [name], MD    Principle Diagnosis: This is a 48 year old gentleman with stage IV renal cell carcinoma diagnosed in 2009.  Prior Therapy: 1. Status post laparoscopic radical nephrectomy.  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  2. Patient status post thoracotomy for a synchronous metastatic lung lesions done October 2009.  He had a lower lobe nodule, biopsy proven to be metastatic renal cell carcinoma.   3. Patient is status post stereotactic radiotherapy to pulmonary nodules in May of 2010. 4. He is S/P Sutent 50 mg 4 weeks on 2 weeks off from 10/2010 to 03/2013. He progressed at that time.  5. He is S/P radiation to the right sacral bone between 4/22 to 4/30.  6. He is S/P XRT to the left shoulder 03/20/14 to 03/31/14.   Current therapy: He is on Votrient 800 mg since 03/2013. His disease include lung lesions, pancreatic as well as right sacral lesion.   Interim History:  Keith [name] presents today for a followup visit. Since his last visit, he reports no new issues. He has not reported any nausea or vomiting. He is not reporting any constipation or diarrhea. He reports pain that is rather diffuse in nature and not very specific.  He is reporting more right hip discomfort which has been intermittent and chronic in nature. He received radiation therapy to that area in the past. He is reporting  neuropathic pain to his bilateral legs which is improved by increasing the Cymbalta dose 120 mg daily. He is not reporting any neurological symptoms. He reports pain to left arm has improved. Has not reported any recent hospitalizations or illnesses. He did not report any headaches blurred vision or double vision. Does not report any seizure activity or alteration of mental status. Is not reporting any  chest pain or difficulty breathing. Is not reporting any cough or hemoptysis. Does not report any abdominal pain or hematochezia. Does not report any hematuria but does report occasional hesitancy and nocturia. He does not report any rashes or lesions or petechiae. He does not report any lymphadenopathy. His performance status and activity level remain stable at this time. Rest of his review of system is unremarkable.  Medications: I have reviewed the patient's current medications.  Current Outpatient Prescriptions  Medication Sig Dispense Refill  . ALPRAZolam (XANAX) 1 MG tablet TAKE 1 TABLET EVERY 8 HOURS AS NEEDED FOR ANXIETY  50 tablet  0  . DULoxetine (CYMBALTA) 60 MG capsule Take 1 capsule (60 mg total) by mouth 2 (two) times daily.  60 capsule  2  . ibuprofen (ADVIL,MOTRIN) 800 MG tablet Take 800 mg by mouth every 8 (eight) hours as needed.      [name] oxycodone (OXY-IR) 5 MG capsule Take 1 or 2 tablets by mouth every 6 hours as needed for pain  60 capsule  0  . pazopanib (VOTRIENT) 200 MG tablet Take 4 tablets (800 mg total) by mouth every morning. Take on an empty stomach.  120 tablet  0   No current facility-administered medications for this visit.     Allergies:  Allergies  Allergen Reactions  . Ceftriaxone Hives  . Hydrocodone Swelling    Past Medical History, Surgical history, Social history, and Family History were reviewed and updated.   Physical Exam: Blood pressure 147/101,  pulse 79, temperature 98.3 F (36.8 C), temperature source Oral, resp. rate 20, height 6\' 3"  (1.905 m), weight 214 lb 3.2 oz (97.16 kg). ECOG: 1 General appearance: alert and awake not in any distress Head: Normocephalic, without obvious abnormality.  Neck: no adenopathy, no masses.  Lymph nodes: Cervical, supraclavicular, and axillary nodes normal. Heart:regular rate and rhythm, S1, S2.  Lung:chest clear, no wheezing, rales, normal symmetric air entry. No dullness to percussion.  Abdomen: soft,  non-tender, without masses or organomegaly EXT:no edema, no despumation. No erythema noted.   Neuro: no focal deficits noted. No weakness noted in his LE or UE.  Lab Results: Lab Results  Component Value Date   WBC 6.5 07/19/2014   HGB 15.4 07/19/2014   HCT 46.6 07/19/2014   MCV 107.9* 07/19/2014   PLT 179 07/19/2014     Impression and Plan:  This is a pleasant 48 year old gentleman with the following issues. 1. Metastatic renal cell carcinoma.  He has documented disease to the lung and the pancreas.  He tolerating Votrient without complications. CT scan from 05/16/2014  showed mostly stable disease. His pancreatic lesion are slightly decreased but his lung nodule slightly increased to 24 x 20 mm but still does not warrant changing his medication. The plan is to continue the current medication without any change. Repeat CT scan in September of  2015. 2. Pancreatic thickening/mass.  This has been biopsy proven to be a renal cell metastasis. No change in treatment at this time. These lesions appears to be responding to the current medication. Repeat imaging studies as mentioned will continue to document that. 3. Depression/Anxiety: He remains on Xanax and Cymbalta. His blood appears to be stable. 4. Left arm pain: MRI of the left humerus showed a slightly enlarged lesion based on the MRI that was done on 02/27/2014. He is S/P XRT to this area with improvement in his pain. 5. Sacral mass: S/P radiation. Reports slight pain in that area. A prescription for oxycodone was given today. 6. Neuropathic pain. Appears to have responded to increased Cymbalta to 120 mg daily.  7. Follow up: 4 weeks. A repeat CT scan at the end of September will be also arrange for today's visit.  [place]

## 2014-07-25 ENCOUNTER — Telehealth: Payer: Self-pay | Admitting: Dietician

## 2014-07-25 NOTE — Telephone Encounter (Signed)
Brief Outpatient Oncology Nutrition Note  Patient has been identified to be at risk on malnutrition screen.  Wt Readings from Last 10 Encounters:  07/19/14 214 lb 3.2 oz (97.16 kg)  06/21/14 213 lb 8 oz (96.843 kg)  05/25/14 217 lb 1.6 oz (98.476 kg)  05/23/14 215 lb 9.6 oz (97.796 kg)  04/11/14 215 lb 11.2 oz (97.841 kg)  03/30/14 212 lb (96.163 kg)  03/24/14 218 lb (98.884 kg)  03/13/14 221 lb 14.4 oz (100.653 kg)  03/01/14 217 lb 11.2 oz (98.748 kg)  01/27/14 222 lb 14.4 oz (101.107 kg)      Dx:  Renal Cell Carcinoma with mets to bone and pancrease  Called patient do to weight loss.  Patient was unavailable.  Contact information for the [facility] provided.  Antonieta Iba, RD, LDN

## 2014-08-07 ENCOUNTER — Other Ambulatory Visit: Payer: Self-pay | Admitting: Oncology

## 2014-08-16 ENCOUNTER — Ambulatory Visit: Payer: BC Managed Care – PPO | Admitting: Physician Assistant

## 2014-08-16 ENCOUNTER — Other Ambulatory Visit: Payer: BC Managed Care – PPO

## 2014-08-16 IMAGING — CT CT HEAD W/O CM
2 series · 16 of 30 positions shown, 20 images · non-contrast
Comparison: Head CT 06/27/2011

CLINICAL DATA: Headache, metastatic renal cell carcinoma

CT HEAD WITHOUT CONTRAST
TECHNIQUE: Contiguous axial images were obtained from the base of
the skull through the vertex without contrast.

[Series 2: head w/o · axial · non-contrast · 0.43mm/px · z∈[-192,-62]mm · 13 of 32 slices shown, 17 images]
[im 3/32  brain]
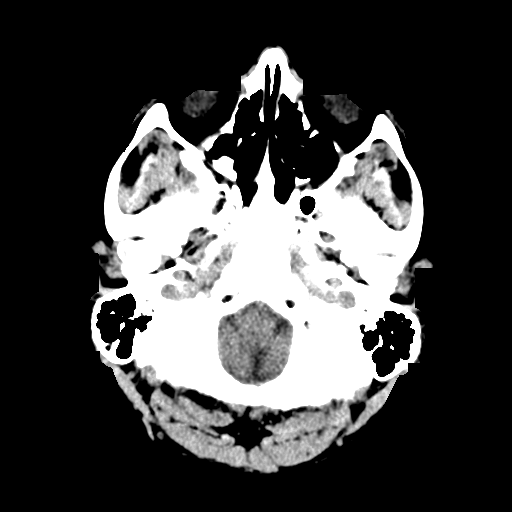
[im 3/32  bone]
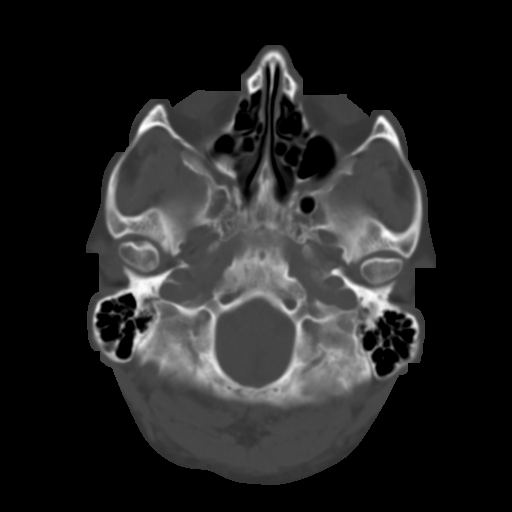
[im 5/32  brain]
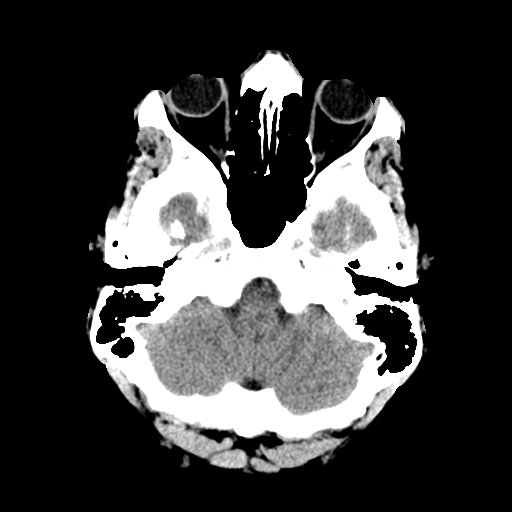
[im 7/32  brain]
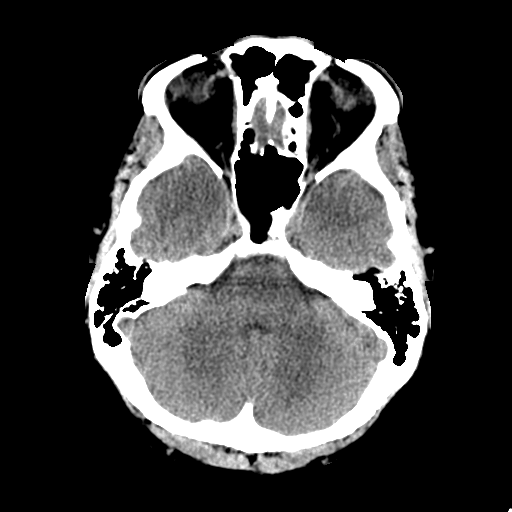
[im 9/32  brain]
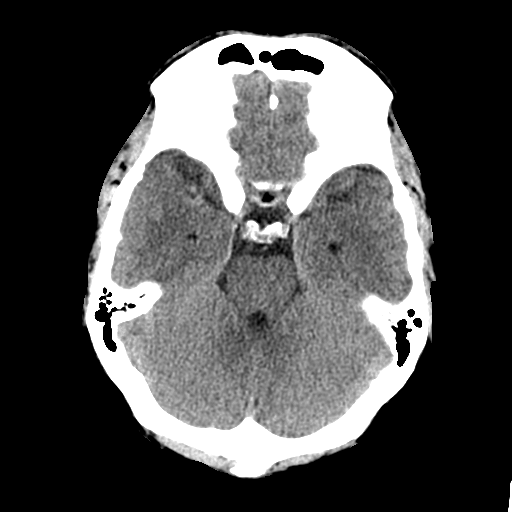
[im 12/32  brain]
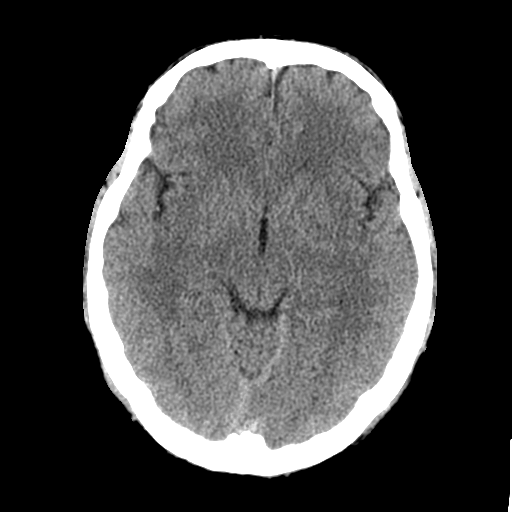
[im 12/32  bone]
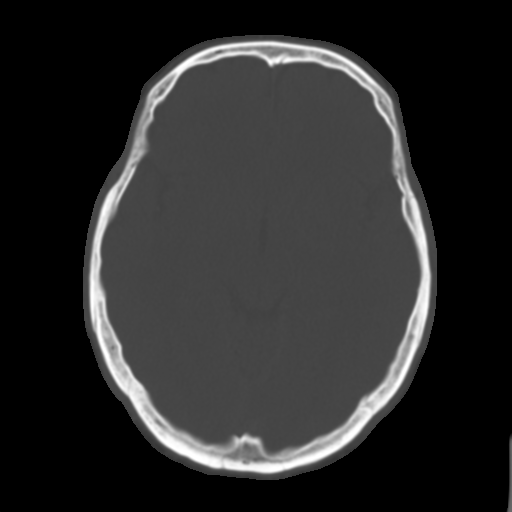
[im 14/32  brain]
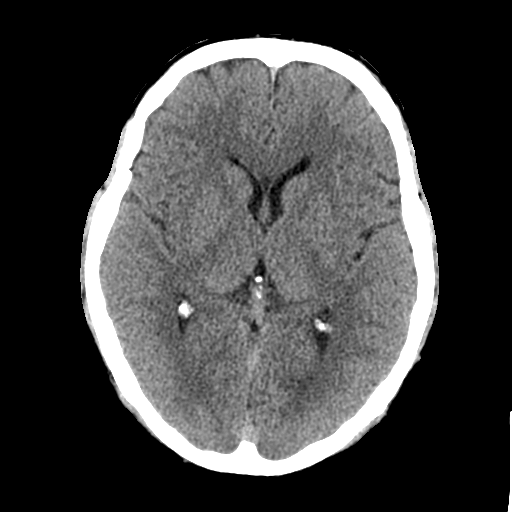
[im 16/32  brain]
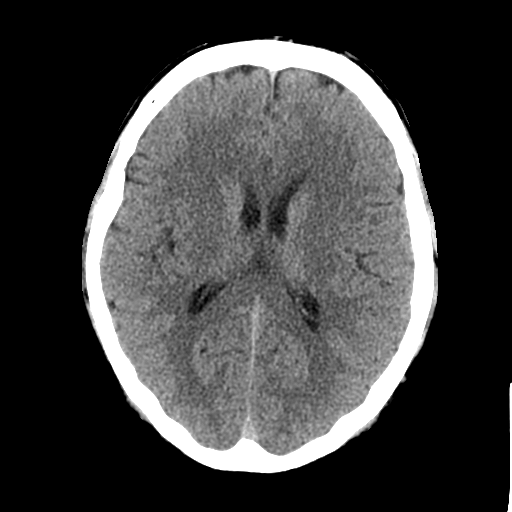
[im 18/32  brain]
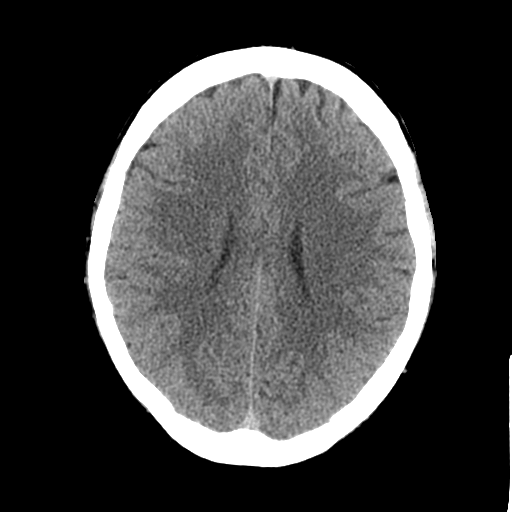
[im 20/32  brain]
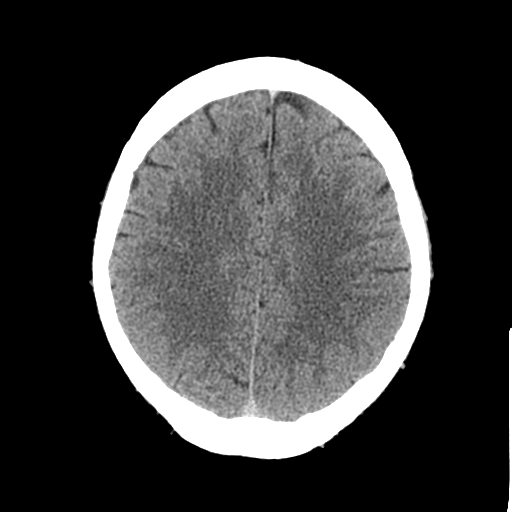
[im 20/32  bone]
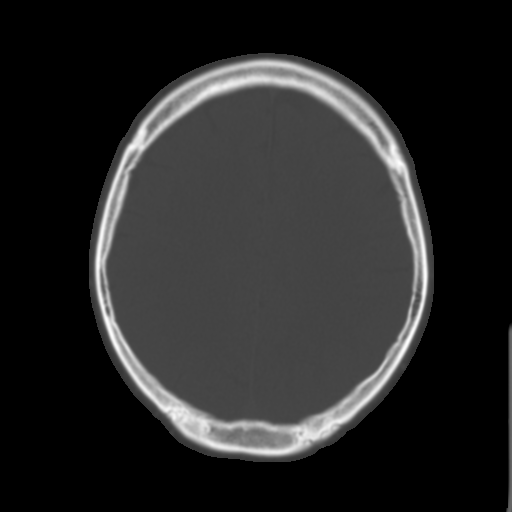
[im 23/32  brain]
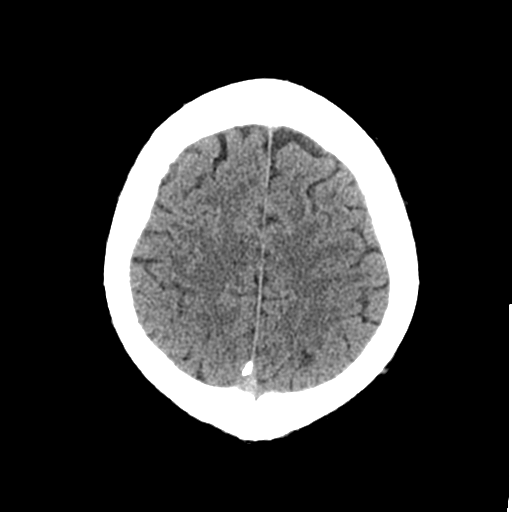
[im 25/32  brain]
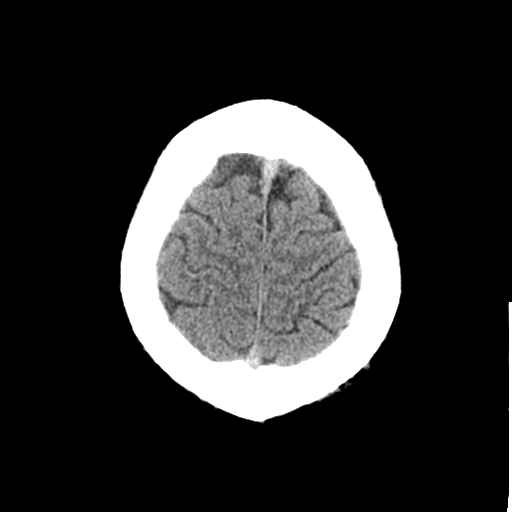
[im 27/32  brain]
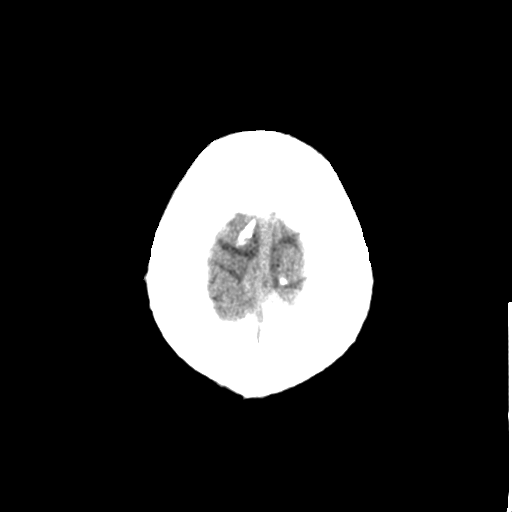
[im 29/32  brain]
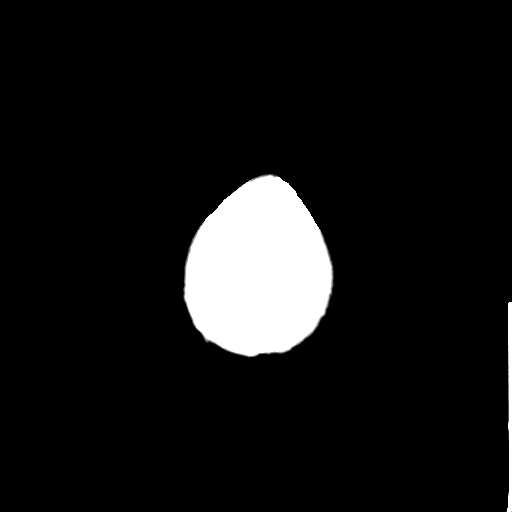
[im 29/32  bone]
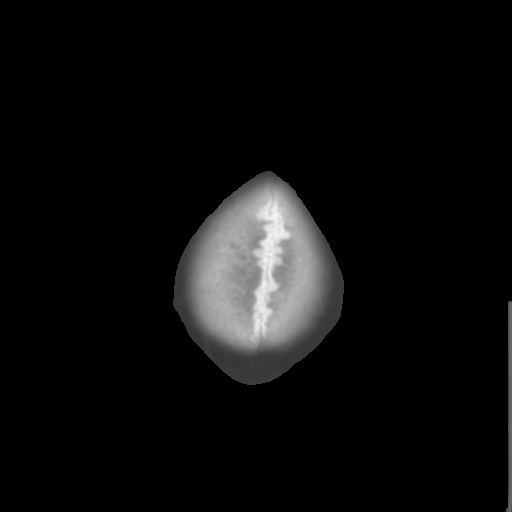

[Series 3: bone windows · axial · 0.43mm/px · z∈[-192,-147]mm · 3 of 32 slices shown]
[im 3/32  bone]
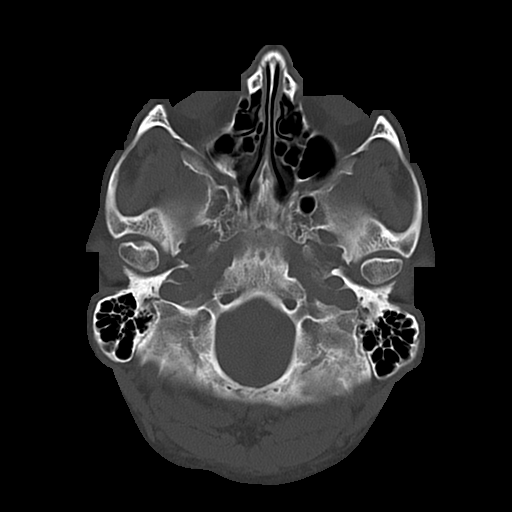
[im 7/32  bone]
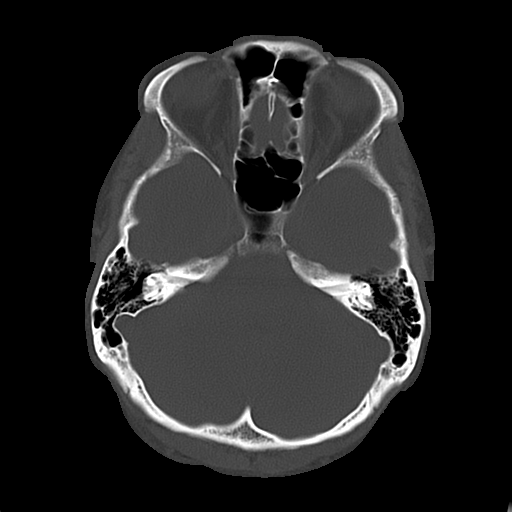
[im 12/32  bone]
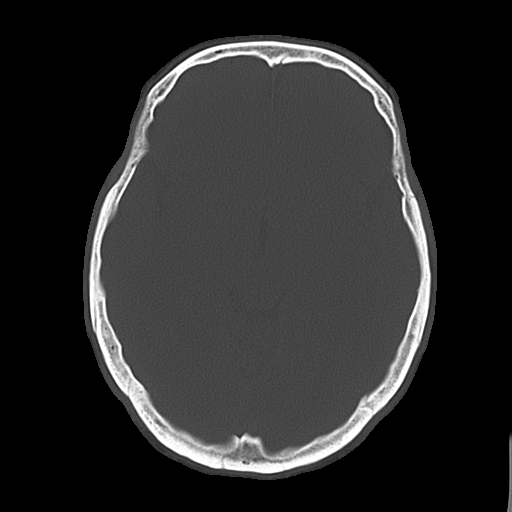

[16 of 30 positions shown; findings below may reference images not displayed]

FINDINGS: No acute intracranial hemorrhage.  No focal mass lesion.
No CT evidence of acute infarction.   No midline shift or mass
effect.  No hydrocephalus.  Basilar cisterns are patent. Paranasal
sinuses and mastoid air cells are clear.  Orbits are normal. A
prominent right MCA branch is again demonstrated.
IMPRESSION: No acute intracranial findings.  No evidence metastasis.

## 2014-08-19 IMAGING — CT CT ANGIO CHEST
1 of 2 series · 19 of 32 positions shown · IV contrast (OMNIPAQUE 300)
Comparison: 08/27/2012

CLINICAL DATA: , loss of consciousness.  History of metastatic
renal cell carcinoma.

CT ANGIOGRAPHY CHEST
TECHNIQUE: Multidetector CT imaging of the chest using the
standard protocol during bolus administration of intravenous
contrast. Multiplanar reconstructed images including MIPs were
obtained and reviewed to evaluate the vascular anatomy.
Contrast: 100mL OMNIPAQUE IOHEXOL 350 MG/ML SOLN

[Series 10: thins for pacs · axial · 0.80mm/px · z∈[+2026,+2290]mm · 19 of 294 slices shown]
[im 15/294  lung]
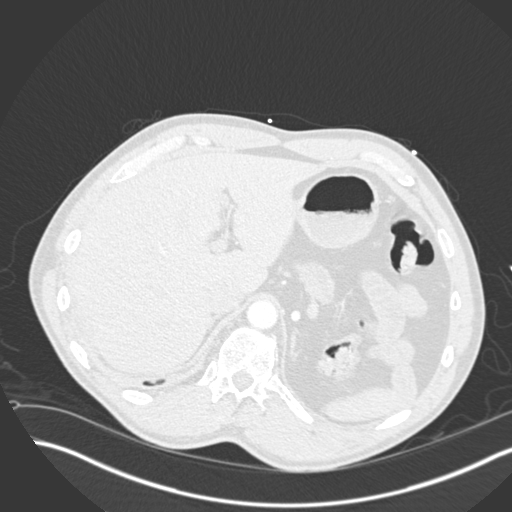
[im 30/294  mediastinal]
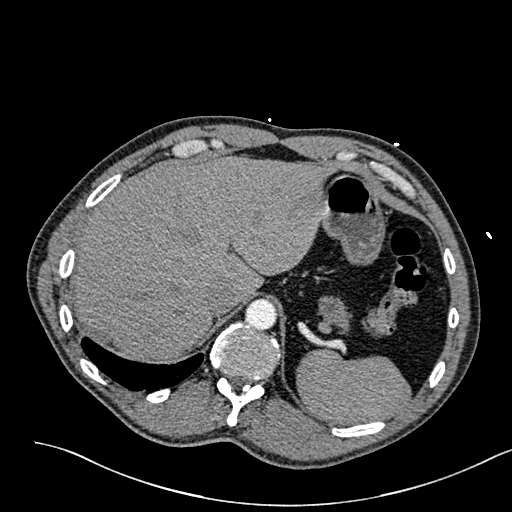
[im 44/294  lung]
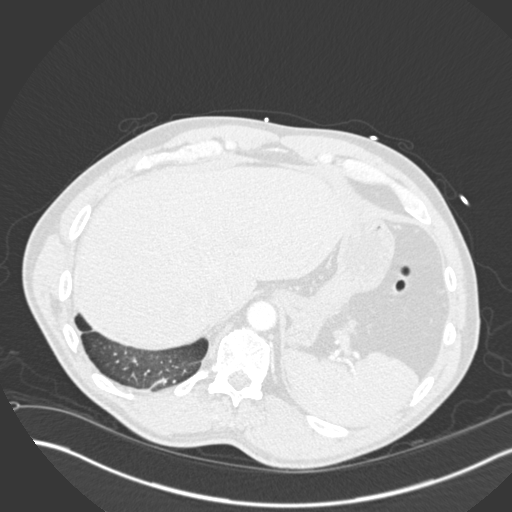
[im 74/294  mediastinal]
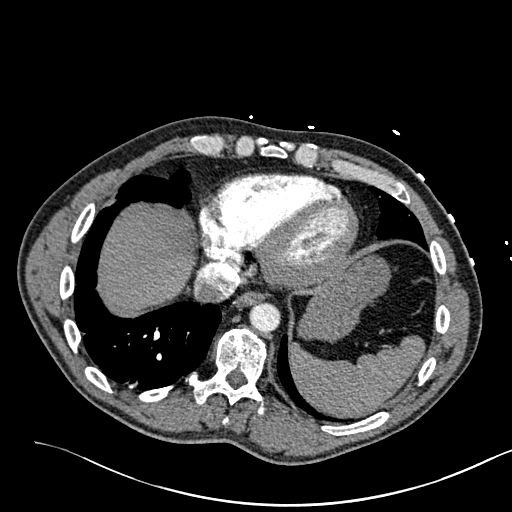
[im 88/294  lung]
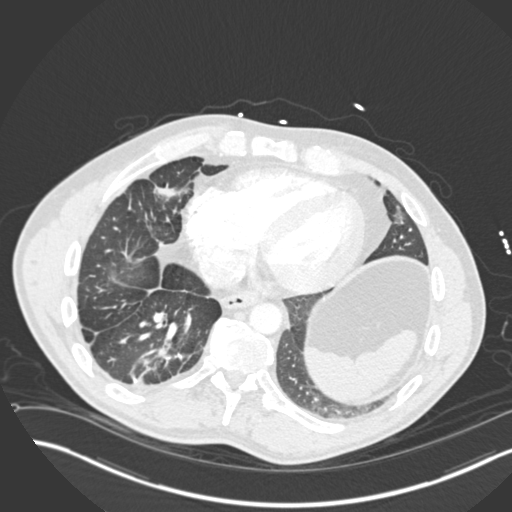
[im 98/294  mediastinal]
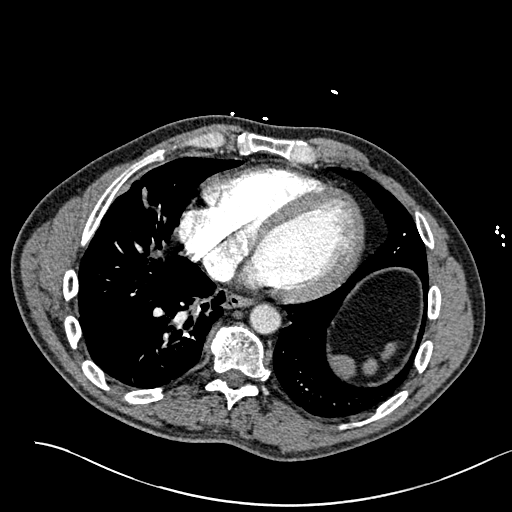
[im 103/294  lung]
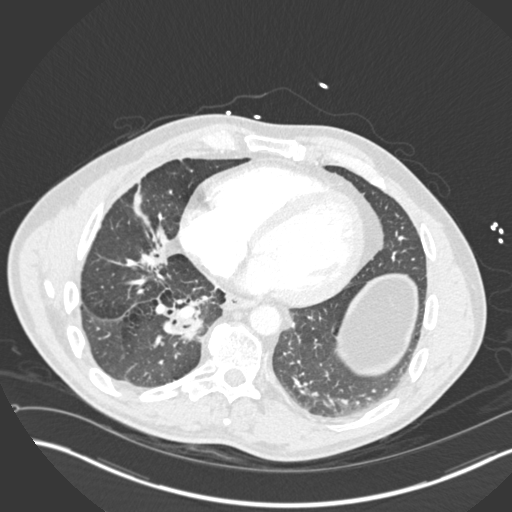
[im 118/294  mediastinal]
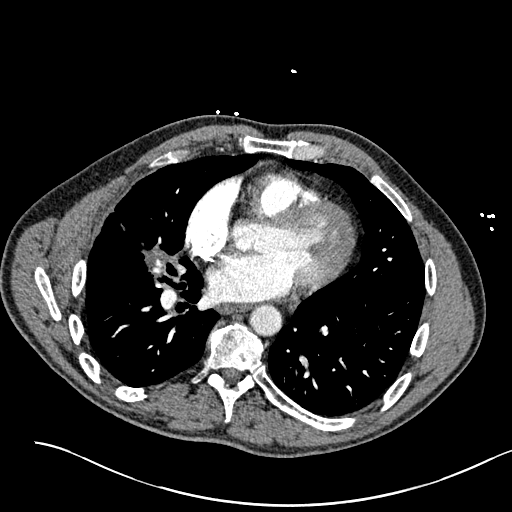
[im 132/294  lung]
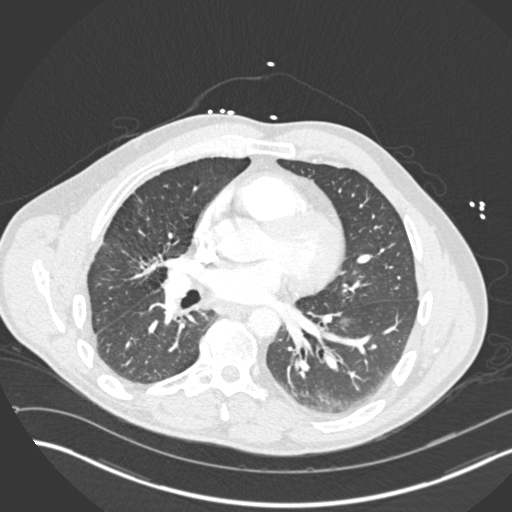
[im 147/294  mediastinal]
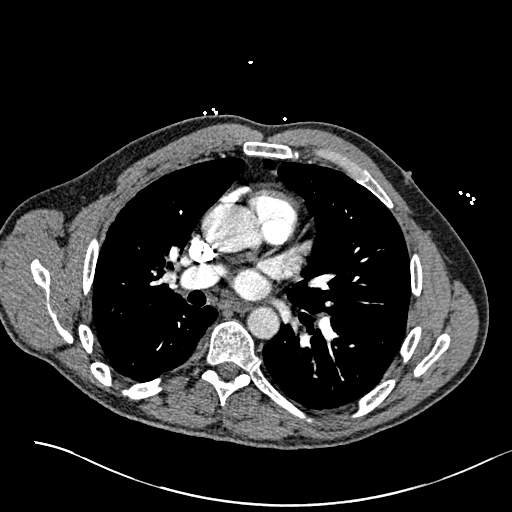
[im 162/294  lung]
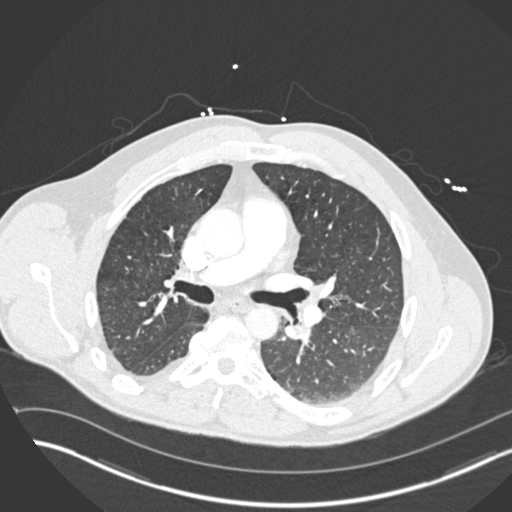
[im 176/294  mediastinal]
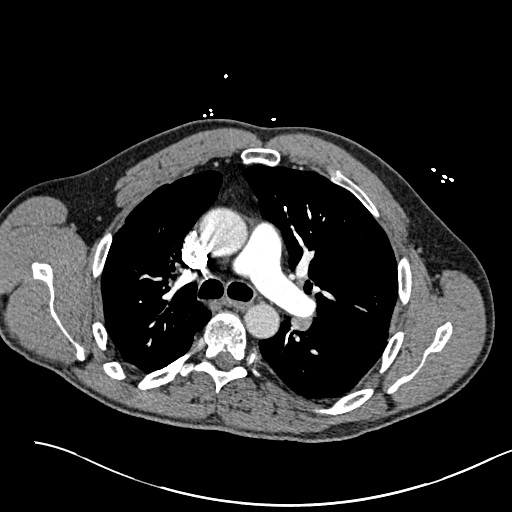
[im 191/294  lung]
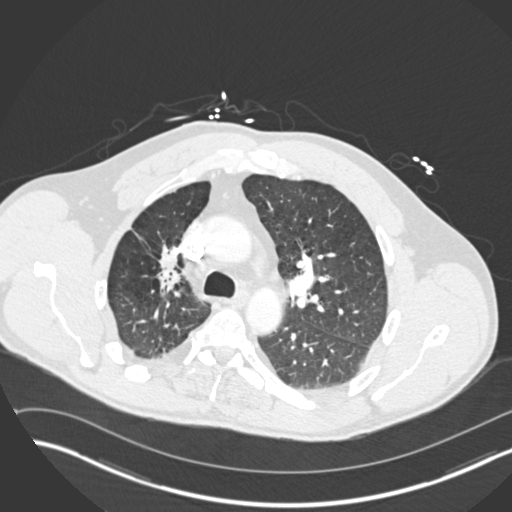
[im 196/294  mediastinal]
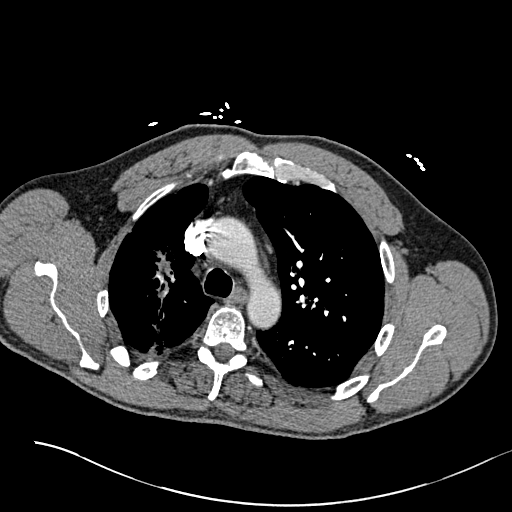
[im 206/294  lung]
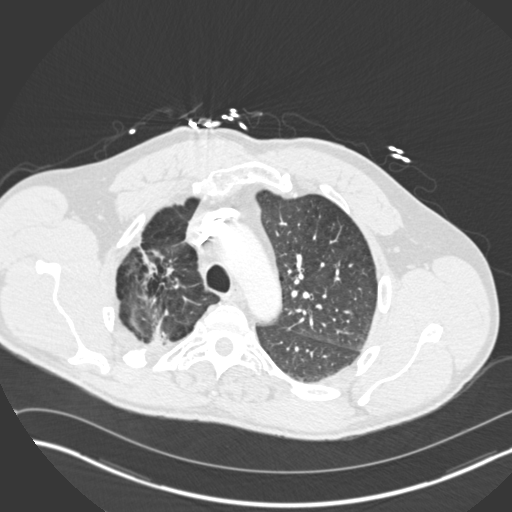
[im 220/294  mediastinal]
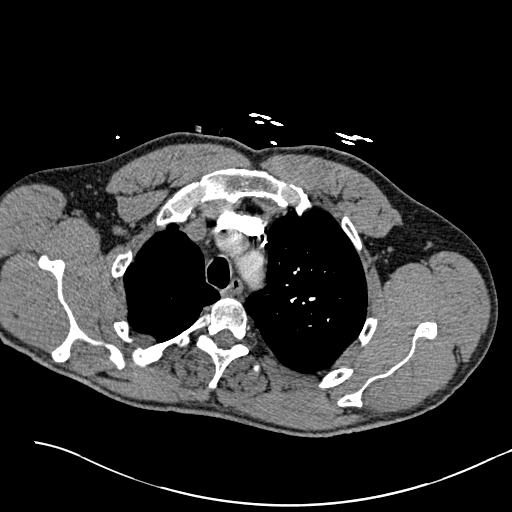
[im 250/294  lung]
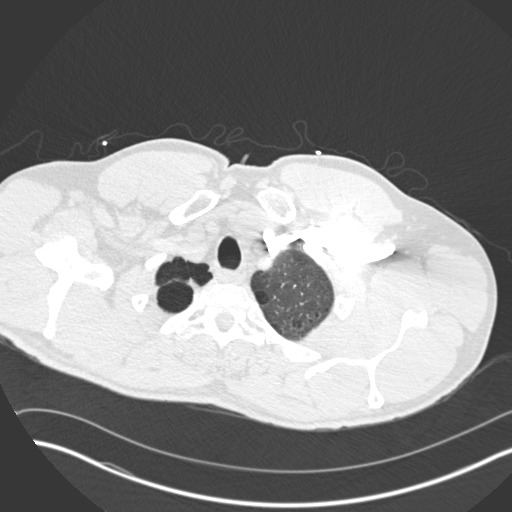
[im 264/294  mediastinal]
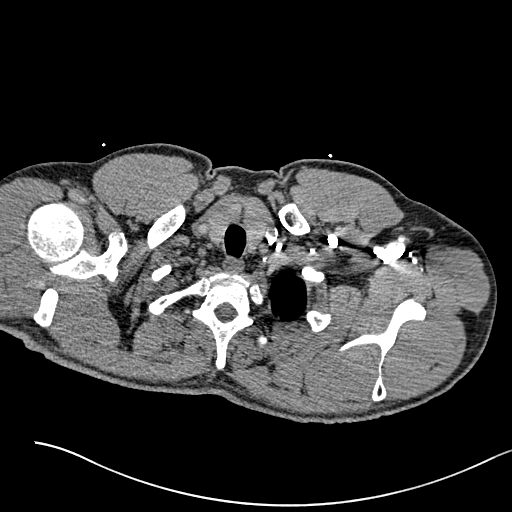
[im 279/294  lung]
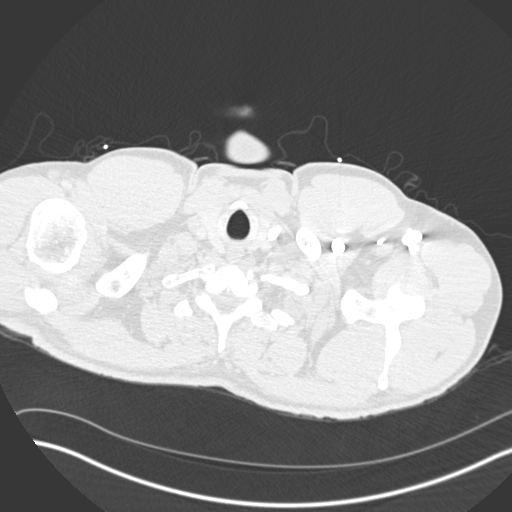

[19 of 32 positions shown; findings below may reference images not displayed]

FINDINGS: No enlarged mediastinal lymph nodes.  There is no pericardial
confusion.
There is an enlarged left hilar lymph node which measures 1.1 cm,
image 41.  This is increased from 7 mm previously.   The pulmonary
arteries appear patent.  No abnormal filling defects identified
within the main pulmonary artery or its branches to suggest an
acute pulmonary embolus.

Changes of centrilobular and paraseptal emphysema identified.
Right perihilar density is noted measuring 2 x 1.7 cm, image 60.
This is compared with will 1.5 x 1.6 cm previously.   Scarring
within the right upper lobe and right midlung is again noted and
appears unchanged from 04/30/2012.  Tiny peripheral nodule in the
right upper lobe is unchanged from previous exam measuring 3.3 mm,
image 49.

Review of the visualized osseous structures is significant for
thoracic spondylosis.  No worrisome lytic or sclerotic bone
lesions.
IMPRESSION: 1.  No evidence for acute pulmonary embolus.
2.  Right perihilar density and left hilar lymph node have
increased in size when compared with 04/30/2012.  Residual or
recurrent tumor cannot be excluded.

## 2014-09-12 ENCOUNTER — Telehealth: Payer: Self-pay | Admitting: *Deleted

## 2014-09-12 ENCOUNTER — Other Ambulatory Visit: Payer: Self-pay | Admitting: Hematology and Oncology

## 2014-09-12 ENCOUNTER — Other Ambulatory Visit: Payer: Self-pay | Admitting: *Deleted

## 2014-09-12 DIAGNOSIS — C649 Malignant neoplasm of unspecified kidney, except renal pelvis: Secondary | ICD-10-CM

## 2014-09-12 MED ORDER — PAZOPANIB HCL 200 MG PO TABS: 800.0000 mg | ORAL_TABLET | Freq: Every morning | ORAL | Status: DC

## 2014-09-12 MED ORDER — ALPRAZOLAM 1 MG PO TABS: ORAL_TABLET | ORAL | Status: DC

## 2014-09-15 ENCOUNTER — Encounter: Payer: Self-pay | Admitting: *Deleted

## 2014-09-19 ENCOUNTER — Encounter (HOSPITAL_COMMUNITY): Payer: Self-pay

## 2014-09-19 ENCOUNTER — Other Ambulatory Visit: Payer: Self-pay | Admitting: Oncology

## 2014-09-19 ENCOUNTER — Ambulatory Visit (HOSPITAL_COMMUNITY)
Admission: RE | Admit: 2014-09-19 | Discharge: 2014-09-19 | Disposition: A | Discharge status: Home / Self Care | Payer: BC Managed Care – PPO | Source: Ambulatory Visit | Attending: Oncology | Admitting: Oncology

## 2014-09-19 ENCOUNTER — Other Ambulatory Visit: Payer: BC Managed Care – PPO

## 2014-09-19 DIAGNOSIS — R911 Solitary pulmonary nodule: Secondary | ICD-10-CM | POA: Insufficient documentation

## 2014-09-19 DIAGNOSIS — J984 Other disorders of lung: Secondary | ICD-10-CM | POA: Diagnosis not present

## 2014-09-19 DIAGNOSIS — K8689 Other specified diseases of pancreas: Secondary | ICD-10-CM | POA: Insufficient documentation

## 2014-09-19 DIAGNOSIS — R091 Pleurisy: Secondary | ICD-10-CM | POA: Diagnosis not present

## 2014-09-19 DIAGNOSIS — C649 Malignant neoplasm of unspecified kidney, except renal pelvis: Secondary | ICD-10-CM

## 2014-09-19 DIAGNOSIS — M25559 Pain in unspecified hip: Secondary | ICD-10-CM | POA: Insufficient documentation

## 2014-09-19 DIAGNOSIS — M949 Disorder of cartilage, unspecified: Secondary | ICD-10-CM

## 2014-09-19 DIAGNOSIS — Z79899 Other long term (current) drug therapy: Secondary | ICD-10-CM | POA: Diagnosis not present

## 2014-09-19 DIAGNOSIS — Z902 Acquired absence of lung [part of]: Secondary | ICD-10-CM | POA: Insufficient documentation

## 2014-09-19 DIAGNOSIS — Z905 Acquired absence of kidney: Secondary | ICD-10-CM | POA: Insufficient documentation

## 2014-09-19 DIAGNOSIS — M899 Disorder of bone, unspecified: Secondary | ICD-10-CM | POA: Diagnosis not present

## 2014-09-19 LAB — CBC WITH DIFFERENTIAL/PLATELET
BASO%: 0.8 % (ref 0.0–2.0)
Basophils Absolute: 0.1 10*3/uL (ref 0.0–0.1)
EOS%: 3.3 % (ref 0.0–7.0)
Eosinophils Absolute: 0.2 10*3/uL (ref 0.0–0.5)
HCT: 44.8 % (ref 38.4–49.9)
HGB: 15.1 g/dL (ref 13.0–17.1)
LYMPH%: 23.5 % (ref 14.0–49.0)
MCH: 35.9 pg — ABNORMAL HIGH (ref 27.2–33.4)
MCHC: 33.7 g/dL (ref 32.0–36.0)
MCV: 106.6 fL — ABNORMAL HIGH (ref 79.3–98.0)
MONO#: 0.4 10*3/uL (ref 0.1–0.9)
MONO%: 6 % (ref 0.0–14.0)
NEUT#: 4.3 10*3/uL (ref 1.5–6.5)
NEUT%: 66.4 % (ref 39.0–75.0)
Platelets: 141 10*3/uL (ref 140–400)
RBC: 4.21 10*6/uL (ref 4.20–5.82)
RDW: 15.2 % — ABNORMAL HIGH (ref 11.0–14.6)
WBC: 6.5 10*3/uL (ref 4.0–10.3)
lymph#: 1.5 10*3/uL (ref 0.9–3.3)

## 2014-09-19 LAB — COMPREHENSIVE METABOLIC PANEL (CC13)
ALT: 34 U/L (ref 0–55)
AST: 40 U/L — ABNORMAL HIGH (ref 5–34)
Albumin: 3.6 g/dL (ref 3.5–5.0)
Alkaline Phosphatase: 84 U/L (ref 40–150)
Anion Gap: 8 mEq/L (ref 3–11)
BUN: 7.4 mg/dL (ref 7.0–26.0)
CO2: 26 mEq/L (ref 22–29)
Calcium: 9.2 mg/dL (ref 8.4–10.4)
Chloride: 105 mEq/L (ref 98–109)
Creatinine: 1.2 mg/dL (ref 0.7–1.3)
Glucose: 109 mg/dl (ref 70–140)
Potassium: 4 mEq/L (ref 3.5–5.1)
Sodium: 140 mEq/L (ref 136–145)
Total Bilirubin: 0.43 mg/dL (ref 0.20–1.20)
Total Protein: 7.4 g/dL (ref 6.4–8.3)

## 2014-09-19 MED ORDER — IOHEXOL 300 MG/ML  SOLN
100.0000 mL | Freq: Once | INTRAMUSCULAR | Status: AC | PRN
  Administered 2014-09-19: 100 mL via INTRAVENOUS

## 2014-09-21 ENCOUNTER — Telehealth: Payer: Self-pay | Admitting: Medical Oncology

## 2014-09-21 ENCOUNTER — Encounter: Payer: Self-pay | Admitting: Oncology

## 2014-09-21 ENCOUNTER — Telehealth: Payer: Self-pay | Admitting: Oncology

## 2014-09-21 ENCOUNTER — Ambulatory Visit (HOSPITAL_BASED_OUTPATIENT_CLINIC_OR_DEPARTMENT_OTHER): Payer: BC Managed Care – PPO | Admitting: Oncology

## 2014-09-21 VITALS — BP 141/95 | HR 82 | Temp 98.2°F | Resp 19 | Ht 75.0 in | Wt 203.2 lb

## 2014-09-21 DIAGNOSIS — C419 Malignant neoplasm of bone and articular cartilage, unspecified: Secondary | ICD-10-CM

## 2014-09-21 DIAGNOSIS — C7951 Secondary malignant neoplasm of bone: Secondary | ICD-10-CM

## 2014-09-21 DIAGNOSIS — C7952 Secondary malignant neoplasm of bone marrow: Secondary | ICD-10-CM

## 2014-09-21 DIAGNOSIS — C7800 Secondary malignant neoplasm of unspecified lung: Secondary | ICD-10-CM

## 2014-09-21 DIAGNOSIS — C649 Malignant neoplasm of unspecified kidney, except renal pelvis: Secondary | ICD-10-CM

## 2014-09-21 DIAGNOSIS — M792 Neuralgia and neuritis, unspecified: Secondary | ICD-10-CM

## 2014-09-21 DIAGNOSIS — C7889 Secondary malignant neoplasm of other digestive organs: Secondary | ICD-10-CM

## 2014-09-21 DIAGNOSIS — C799 Secondary malignant neoplasm of unspecified site: Secondary | ICD-10-CM

## 2014-09-21 MED ORDER — ALPRAZOLAM 1 MG PO TABS: ORAL_TABLET | ORAL | Status: DC

## 2014-09-21 MED ORDER — OXYCODONE HCL 5 MG PO CAPS: ORAL_CAPSULE | ORAL | Status: DC

## 2014-09-21 NOTE — Telephone Encounter (Signed)
gv and printed appt sched and avs for pt for NOV °

## 2014-09-21 NOTE — Telephone Encounter (Signed)
Wife called reporting not able to come in with husband this am appt requesting to know of CT results. Informed wife, based on MD office notes. She expressed thanks, denies further questions.

## 2014-09-21 NOTE — Progress Notes (Signed)
Hematology and Oncology Follow Up Visit  Keith [name] [id number] 03/06/1966 48 y.o. 09/21/2014 8:52 AM  Keith Bring, MD  Keith [name], M.D.  Keith Bent, MD  Keith Banister, MD    Principle Diagnosis: This is a 48 year old gentleman with stage IV renal cell carcinoma diagnosed in 2009.  Prior Therapy: 1. Status post laparoscopic radical nephrectomy.  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  2. Patient status post thoracotomy for a synchronous metastatic lung lesions done October 2009.  He had a lower lobe nodule, biopsy proven to be metastatic renal cell carcinoma.   3. Patient is status post stereotactic radiotherapy to pulmonary nodules in May of 2010. 4. He is S/P Sutent 50 mg 4 weeks on 2 weeks off from 10/2010 to 03/2013. He progressed at that time.  5. He is S/P radiation to the right sacral bone between 4/22 to 4/30.  6. He is S/P XRT to the left shoulder 03/20/14 to 03/31/14.   Current therapy: He is on Votrient 800 mg since 03/2013. His disease include lung lesions, pancreatic as well as right sacral lesion.   Interim History:  Mr. Keith [name] presents today for a followup visit. Since his last visit, he reports no new issues. He continues to have diffuse pains and mild arthralgias. He also lost more weight since last visit. He has not reported any nausea or vomiting. He is not reporting any constipation or diarrhea.  He is reporting  neuropathic pain to his bilateral legs which is improved by increasing the Cymbalta dose 120 mg daily. He is not reporting any neurological symptoms. He reports pain to left arm has improved. Has not reported any recent hospitalizations or illnesses. He did not report any headaches blurred vision or double vision. Does not report any seizure activity or alteration of mental status. Is not reporting any chest pain or difficulty breathing. Is not reporting any cough or hemoptysis. Does not report any abdominal pain or  hematochezia. Does not report any hematuria but does report occasional hesitancy and nocturia. He does not report any rashes or lesions or petechiae. He does not report any lymphadenopathy. His performance status and activity level remain stable at this time. Rest of his review of system is unremarkable.  Medications: I have reviewed the patient's current medications.  Current Outpatient Prescriptions  Medication Sig Dispense Refill  . ALPRAZolam (XANAX) 1 MG tablet TAKE 1 TABLET EVERY 8 HOURS AS NEEDED FOR ANXIETY  100 tablet  0  . azithromycin (ZITHROMAX) 250 MG tablet       . DULoxetine (CYMBALTA) 60 MG capsule TAKE 1 CAPSULE TWICE DAILY  60 capsule  2  . ibuprofen (ADVIL,MOTRIN) 800 MG tablet Take 800 mg by mouth every 8 (eight) hours as needed.      [name] oxycodone (OXY-IR) 5 MG capsule Take 1 or 2 tablets by mouth every 6 hours as needed for pain  60 capsule  0  . pazopanib (VOTRIENT) 200 MG tablet Take 4 tablets (800 mg total) by mouth every morning. Take on an empty stomach.  120 tablet  0  . VIAGRA 100 MG tablet        No current facility-administered medications for this visit.     Allergies:  Allergies  Allergen Reactions  . Ceftriaxone Hives  . Hydrocodone Swelling    Past Medical History, Surgical history, Social history, and Family History were reviewed and updated.   Physical Exam: Blood pressure 141/95, pulse 82, temperature 98.2 F (  36.8 C), temperature source Oral, resp. rate 19, height 6\' 3"  (1.905 m), weight 203 lb 3.2 oz (92.171 kg), SpO2 98.00%. ECOG: 1 General appearance: alert and awake not in any distress Head: Normocephalic, without obvious abnormality.  Neck: no adenopathy, no masses.  Lymph nodes: Cervical, supraclavicular, and axillary nodes normal. Heart:regular rate and rhythm, S1, S2.  Lung:chest clear, no wheezing, rales, normal symmetric air entry. No dullness to percussion.  Abdomen: soft, non-tender, without masses or organomegaly EXT:no edema, no  despumation. No erythema noted.   Neuro: no focal deficits noted. No weakness noted in his LE or UE.  Lab Results: Lab Results  Component Value Date   WBC 6.5 09/19/2014   HGB 15.1 09/19/2014   HCT 44.8 09/19/2014   MCV 106.6* 09/19/2014   PLT 141 09/19/2014   EXAM:  CT CHEST WITH CONTRAST  CT ABDOMEN AND PELVIS WITH AND WITHOUT CONTRAST  TECHNIQUE:  Multidetector CT imaging of the chest was performed during  intravenous contrast administration. Multidetector CT imaging of the  abdomen and pelvis was performed following the standard protocol  before and during bolus administration of intravenous contrast.  CONTRAST: 153mL OMNIPAQUE IOHEXOL 300 MG/ML SOLN  COMPARISON: Prior examinations 05/16/2014 and 01/20/2014.  FINDINGS:  CT CHEST FINDINGS  Mediastinum: There are no enlarged mediastinal, hilar or axillary  lymph nodes. The thyroid gland, trachea and esophagus appear normal.  The heart size is normal. There are no significant vascular  findings.  Lungs/Pleura: There is no pleural or pericardial effusion.Pleural  thickening on the right is stable. There are stable postsurgical and  presumed postradiation changes in the right lung. Right suprahilar  perihilar scarring on images 18 through 21 is stable. The more  mass-like density involving the right middle lobe and adjacent right  upper lobe is stable, measuring approximately 2.0 x 1.8 cm on image  34. 4 mm subpleural right upper lobe nodule on image number 27 is  stable. There are no new or enlarging pulmonary nodules. The left  lung is clear.  Musculoskeletal/Chest wall: No chest wall mass or suspicious osseous  findings. Thoracotomy changes on the right are stable.  CT ABDOMEN AND PELVIS FINDINGS  Liver/Biliary/Pancreas: The liver is normal in density without focal  abnormality. No evidence of gallstones, gallbladder wall thickening  or biliary dilatation. Again demonstrated are multiple hypervascular  pancreatic mass is  associated with dilatation of the pancreatic duct  within the pancreatic body and tail. Compared with the most recent  study, the lesions demonstrated less avid enhancement and are more  difficult to accurately measure. Subjectively, there has been little  change. Lesion at the junction of the pancreatic head and body  measures 2.1 x 1.5 cm on image 65 of series 6. A lesion within the  pancreatic body measures 2.0 x 2.7 cm on image 60.  Spleen/Adrenal glands: Unremarkable.  Kidneys/Ureters/Bladder: The left kidney is surgically absent. There  is no mass within the left nephrectomy bed. The right kidney appears  normal. There is no hydronephrosis or urinary tract calculus. The  bladder appears normal.  Bowel/Peritoneum: The stomach, small bowel, appendix and colon  demonstrate no significant findings. No ascites or peritoneal  nodularity.  Retroperitoneum/Pelvis: There are no enlarged abdominal or pelvic  lymph nodes. No significant vascular findings are present. The  prostate gland appears normal.  Abdominal wall: No abdominal wall masses or hernias.  Musculoskeletal: Lytic lesion in the right aspect of the sacrum is  similar to prior studies, measuring 3.1 x 2.2 cm  on image 107. No  new lesions or pathologic fractures demonstrated.  IMPRESSION:  1. The findings in the chest appear stable. The nodular densities  medially in the right lung are favored to reflect post treatment  scarring based on stability. No new or enlarging nodules  demonstrated.  2. The multiple enhancing lesions within the pancreas demonstrate  decreased enhancement, likely related to interval therapy. Overall  size and resulting pancreatic ductal dilatation have not  significantly changed.  3. Lytic lesion in the right sacrum is also stable.  4. No progressive disease demonstrated.   Impression and Plan:  This is a pleasant 48 year old gentleman with the following issues. 1. Metastatic renal cell carcinoma.   He has documented disease to the lung and the pancreas.  He tolerating Votrient without complications. CT scan from 09/19/2014 was reviewed today and showed mostly stable disease. His pancreatic lesion are slightly decreased but his lung nodule also stable. The plan is to continue the current medication without any change. Repeat CT scan in 4 months. 2. Pancreatic thickening/mass.  This has been biopsy proven to be a renal cell metastasis. No change in treatment at this time. These lesions appears to be responding to the current medication.  3. Depression/Anxiety: He remains on Xanax and Cymbalta. His blood appears to be stable. 4. Left arm pain: MRI of the left humerus showed a slightly enlarged lesion based on the MRI that was done on 02/27/2014. He is S/P XRT to this area with improvement in his pain. 5. Sacral mass: S/P radiation. Reports slight pain in that area. A prescription for oxycodone was given today. 6. Neuropathic pain. Appears to have responded to increased Cymbalta to 120 mg daily. I have refilled his oxycodone and Xanax as well today. 7. Follow up: 4 weeks.   [place]

## 2014-10-09 ENCOUNTER — Telehealth: Payer: Self-pay | Admitting: *Deleted

## 2014-10-09 NOTE — Telephone Encounter (Signed)
[employer] Patient Production assistant, radio Assist faxed Votrient refill request.  Request to provider's desk/in-basket for review.

## 2014-10-10 ENCOUNTER — Other Ambulatory Visit: Payer: Self-pay | Admitting: *Deleted

## 2014-10-10 ENCOUNTER — Telehealth: Payer: Self-pay | Admitting: Dietician

## 2014-10-10 DIAGNOSIS — C649 Malignant neoplasm of unspecified kidney, except renal pelvis: Secondary | ICD-10-CM

## 2014-10-10 MED ORDER — PAZOPANIB HCL 200 MG PO TABS: 800.0000 mg | ORAL_TABLET | Freq: Every morning | ORAL | Status: DC

## 2014-10-18 NOTE — Telephone Encounter (Signed)
Brief Outpatient Oncology Nutrition Note  Patient has been identified to be at risk on malnutrition screen.  Wt Readings from Last 10 Encounters:  09/21/14 203 lb 3.2 oz (92.171 kg)  07/19/14 214 lb 3.2 oz (97.16 kg)  06/21/14 213 lb 8 oz (96.843 kg)  05/25/14 217 lb 1.6 oz (98.476 kg)  05/23/14 215 lb 9.6 oz (97.796 kg)  04/11/14 215 lb 11.2 oz (97.841 kg)  03/30/14 212 lb (96.163 kg)  03/24/14 218 lb (98.884 kg)  03/13/14 221 lb 14.4 oz (100.653 kg)  03/01/14 217 lb 11.2 oz (98.748 kg)    Dx:  Metastatic renal cell carcinoma to lung and pancrease stage IV.  Called patient due to weight loss.  Patient was not available.  Contact information for the outpatient [place] [facility] provided.  Antonieta Iba, RD, LDN

## 2014-10-19 ENCOUNTER — Other Ambulatory Visit: Payer: Self-pay | Admitting: Medical Oncology

## 2014-10-19 DIAGNOSIS — C649 Malignant neoplasm of unspecified kidney, except renal pelvis: Secondary | ICD-10-CM

## 2014-10-19 MED ORDER — ALPRAZOLAM 1 MG PO TABS: ORAL_TABLET | ORAL | Status: DC

## 2014-10-19 NOTE — Telephone Encounter (Signed)
Patient called requesting refill for xanax and oxycodone to be sent to his pharmacy. Informed patient office can call the xanax in but he will need to p.u the oxycodone. Patient states he can wait till next appt for the oxy., 11/05, and will get refill at that time. Pt denies questions at this time.  Per MD, xanax refilled and faxed to his pharmacy.  F/u appt 11/05

## 2014-10-25 ENCOUNTER — Other Ambulatory Visit: Payer: Self-pay | Admitting: *Deleted

## 2014-10-25 DIAGNOSIS — C649 Malignant neoplasm of unspecified kidney, except renal pelvis: Secondary | ICD-10-CM

## 2014-10-25 MED ORDER — PAZOPANIB HCL 200 MG PO TABS: 800.0000 mg | ORAL_TABLET | Freq: Every morning | ORAL | Status: DC

## 2014-10-26 ENCOUNTER — Other Ambulatory Visit: Payer: BC Managed Care – PPO

## 2014-10-26 ENCOUNTER — Ambulatory Visit: Payer: BC Managed Care – PPO | Admitting: Oncology

## 2014-10-30 ENCOUNTER — Other Ambulatory Visit: Payer: Self-pay | Admitting: *Deleted

## 2014-10-30 ENCOUNTER — Telehealth: Payer: Self-pay | Admitting: Oncology

## 2014-10-30 NOTE — Telephone Encounter (Signed)
Lft msg for pt labs/ov per 11/09 POF r/s from 11/05 to 11/27, advised pt if he couldn't make this apt to call to r/s .[name]... Also mailed updated sch ... KJ

## 2014-10-30 NOTE — Progress Notes (Signed)
Patient called to say he had strep throat on 10/26/14 and did not show up for his appt. pof to schedulers to r/s lab and ov to first available, with [name].

## 2014-11-06 ENCOUNTER — Other Ambulatory Visit: Payer: Self-pay | Admitting: Oncology

## 2014-11-17 ENCOUNTER — Telehealth: Payer: Self-pay | Admitting: Oncology

## 2014-11-17 ENCOUNTER — Ambulatory Visit (HOSPITAL_BASED_OUTPATIENT_CLINIC_OR_DEPARTMENT_OTHER): Payer: Self-pay | Admitting: Oncology

## 2014-11-17 ENCOUNTER — Other Ambulatory Visit (HOSPITAL_BASED_OUTPATIENT_CLINIC_OR_DEPARTMENT_OTHER): Payer: Self-pay

## 2014-11-17 VITALS — BP 117/88 | HR 82 | Temp 99.3°F | Resp 18 | Ht 75.0 in | Wt 194.6 lb

## 2014-11-17 DIAGNOSIS — Z720 Tobacco use: Secondary | ICD-10-CM

## 2014-11-17 DIAGNOSIS — C7800 Secondary malignant neoplasm of unspecified lung: Secondary | ICD-10-CM

## 2014-11-17 DIAGNOSIS — C649 Malignant neoplasm of unspecified kidney, except renal pelvis: Secondary | ICD-10-CM

## 2014-11-17 DIAGNOSIS — C7952 Secondary malignant neoplasm of bone marrow: Secondary | ICD-10-CM

## 2014-11-17 DIAGNOSIS — C7951 Secondary malignant neoplasm of bone: Secondary | ICD-10-CM

## 2014-11-17 DIAGNOSIS — C7989 Secondary malignant neoplasm of other specified sites: Secondary | ICD-10-CM

## 2014-11-17 DIAGNOSIS — R634 Abnormal weight loss: Secondary | ICD-10-CM

## 2014-11-17 DIAGNOSIS — F418 Other specified anxiety disorders: Secondary | ICD-10-CM

## 2014-11-17 DIAGNOSIS — C419 Malignant neoplasm of bone and articular cartilage, unspecified: Secondary | ICD-10-CM

## 2014-11-17 DIAGNOSIS — M792 Neuralgia and neuritis, unspecified: Secondary | ICD-10-CM

## 2014-11-17 LAB — COMPREHENSIVE METABOLIC PANEL (CC13)
ALT: 19 U/L (ref 0–55)
AST: 14 U/L (ref 5–34)
Albumin: 3.5 g/dL (ref 3.5–5.0)
Alkaline Phosphatase: 75 U/L (ref 40–150)
Anion Gap: 9 mEq/L (ref 3–11)
BUN: 15.2 mg/dL (ref 7.0–26.0)
CO2: 29 mEq/L (ref 22–29)
Calcium: 9.4 mg/dL (ref 8.4–10.4)
Chloride: 102 mEq/L (ref 98–109)
Creatinine: 1.4 mg/dL — ABNORMAL HIGH (ref 0.7–1.3)
Glucose: 91 mg/dl (ref 70–140)
Potassium: 4.4 mEq/L (ref 3.5–5.1)
Sodium: 140 mEq/L (ref 136–145)
Total Bilirubin: 0.44 mg/dL (ref 0.20–1.20)
Total Protein: 6.9 g/dL (ref 6.4–8.3)

## 2014-11-17 LAB — CBC WITH DIFFERENTIAL/PLATELET
BASO%: 0.9 % (ref 0.0–2.0)
Basophils Absolute: 0.1 10*3/uL (ref 0.0–0.1)
EOS%: 2.4 % (ref 0.0–7.0)
Eosinophils Absolute: 0.2 10*3/uL (ref 0.0–0.5)
HCT: 46 % (ref 38.4–49.9)
HGB: 15 g/dL (ref 13.0–17.1)
LYMPH%: 30.3 % (ref 14.0–49.0)
MCH: 34.7 pg — ABNORMAL HIGH (ref 27.2–33.4)
MCHC: 32.6 g/dL (ref 32.0–36.0)
MCV: 106.4 fL — ABNORMAL HIGH (ref 79.3–98.0)
MONO#: 0.4 10*3/uL (ref 0.1–0.9)
MONO%: 6.3 % (ref 0.0–14.0)
NEUT#: 4.2 10*3/uL (ref 1.5–6.5)
NEUT%: 60.1 % (ref 39.0–75.0)
Platelets: 156 10*3/uL (ref 140–400)
RBC: 4.33 10*6/uL (ref 4.20–5.82)
RDW: 14.4 % (ref 11.0–14.6)
WBC: 7 10*3/uL (ref 4.0–10.3)
lymph#: 2.1 10*3/uL (ref 0.9–3.3)

## 2014-11-17 MED ORDER — OXYCODONE HCL 5 MG PO CAPS: ORAL_CAPSULE | ORAL | Status: DC

## 2014-11-17 MED ORDER — ALPRAZOLAM 1 MG PO TABS: ORAL_TABLET | ORAL | Status: DC

## 2014-11-17 NOTE — Progress Notes (Signed)
Hematology and Oncology Follow Up Visit  Keith [name] [id number] February 01, 1966 48 y.o. 11/17/2014 3:56 PM  [name], MD  [name], M.D.  [name], MD  [name], MD    Principle Diagnosis: This is a 47 year old gentleman with stage IV renal cell carcinoma diagnosed in 2009.  Prior Therapy: 1. Status post laparoscopic radical nephrectomy.  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  2. Patient status post thoracotomy for a synchronous metastatic lung lesions done October 2009.  He had a lower lobe nodule, biopsy proven to be metastatic renal cell carcinoma.   3. Patient is status post stereotactic radiotherapy to pulmonary nodules in May of 2010. 4. He is S/P Sutent 50 mg 4 weeks on 2 weeks off from 10/2010 to 03/2013. He progressed at that time.  5. He is S/P radiation to the right sacral bone between 4/22 to 4/30.  6. He is S/P XRT to the left shoulder 03/20/14 to 03/31/14.   Current therapy: He is on Votrient 800 mg since 03/2013. His disease include lung lesions, pancreatic as well as right sacral lesion.   Interim History:  Keith [name] presents today for a followup visit. Since his last visit, he reports complaints today. These include diffuse pains and mild arthralgias. He also lost more weight since last visit. Although he is eating properly he continues to complain of losing weight. He lost close to 20 pounds in the last 3 months. He continues to smoke regularly and his energy is certainly very low at this point. He did not report any new complications or illnesses from Votrient. He has reported that he is separated from his wife and causing him a lot of anxiety. He has not reported any nausea or vomiting. He is not reporting any constipation or diarrhea.  He is reporting  neuropathic pain to his bilateral legs which is improved by increasing the Cymbalta dose 120 mg daily. He is not reporting any neurological symptoms. Has not reported any  recent hospitalizations or illnesses. He did not report any headaches blurred vision or double vision. Does not report any seizure activity or alteration of mental status. Is not reporting any chest pain or difficulty breathing. Is not reporting any cough or hemoptysis. Does not report any abdominal pain or hematochezia. Does not report any hematuria but does report occasional hesitancy and nocturia. He does not report any rashes or lesions or petechiae. He does not report any lymphadenopathy. His performance status and activity level remain stable at this time. Rest of his review of system is unremarkable.  Medications: I have reviewed the patient's current medications.  Current Outpatient Prescriptions  Medication Sig Dispense Refill  . ALPRAZolam (XANAX) 1 MG tablet TAKE 1 TABLET EVERY 8 HOURS AS NEEDED FOR ANXIETY 100 tablet 0  . azithromycin (ZITHROMAX) 250 MG tablet     . DULoxetine (CYMBALTA) 60 MG capsule TAKE 1 CAPSULE TWICE DAILY 60 capsule 2  . ibuprofen (ADVIL,MOTRIN) 800 MG tablet Take 800 mg by mouth every 8 (eight) hours as needed.    [name] oxycodone (OXY-IR) 5 MG capsule Take 1 or 2 tablets by mouth every 6 hours as needed for pain 60 capsule 0  . pazopanib (VOTRIENT) 200 MG tablet Take 4 tablets (800 mg total) by mouth every morning. Take on an empty stomach. 120 tablet 0  . VIAGRA 100 MG tablet      No current facility-administered medications for this visit.     Allergies:  Allergies  Allergen Reactions  . Ceftriaxone Hives  . Hydrocodone Swelling    Past Medical History, Surgical history, Social history, and Family History were reviewed and updated.   Physical Exam: Blood pressure 117/88, pulse 82, temperature 99.3 F (37.4 C), temperature source Oral, resp. rate 18, height 6\' 3"  (1.905 m), weight 194 lb 9.6 oz (88.27 kg). ECOG: 1 General appearance: alert and awake chronically ill-appearing.  Head: Normocephalic, without obvious abnormality.  Neck: no adenopathy, no  masses.  Lymph nodes: Cervical, supraclavicular, and axillary nodes normal. Heart:regular rate and rhythm, S1, S2.  Lung:chest clear, no wheezing, rales, normal symmetric air entry. No dullness to percussion.  Abdomen: soft, non-tender, without masses or organomegaly EXT:no edema, no despumation. No erythema noted.   Neuro: no focal deficits noted.   Lab Results: Lab Results  Component Value Date   WBC 7.0 11/17/2014   HGB 15.0 11/17/2014   HCT 46.0 11/17/2014   MCV 106.4* 11/17/2014   PLT 156 11/17/2014      Impression and Plan:  This is a pleasant 48 year old gentleman with the following issues. 1. Metastatic renal cell carcinoma.  He has documented disease to the lung and the pancreas.  He tolerating Votrient without complications. CT scan from 09/19/2014 showed mostly stable disease. He is complaining of vague symptoms of weight loss, arthralgias and fatigue. To ensure that his cancer has not progressed, we'll obtain a CT scan in January 2016 for staging purposes. The plan is to keep him on the Votrient with the same dose and schedule. 2. Pancreatic thickening/mass.  This has been biopsy proven to be a renal cell metastasis. No change in treatment at this time.  3. Depression/Anxiety: He remains on Xanax and Cymbalta. His blood appears to be stable. 4. Weight loss: If his CT scan continued to show stable disease, we can consider decreasing his Votrient dose to see if that helps. In the meantime, I have instructed him to increase his nutritional intake. I will also recommend and using supplements such as boost or Ensure in addition to regular meals. 5. Sacral mass: S/P radiation. Reports slight pain in that area. A prescription for oxycodone was given today. 6. Neuropathic pain. Appears to have responded to increased Cymbalta to 120 mg daily. I have refilled his oxycodone and Xanax as well today. 7. Follow up: 12/2014  [facility]

## 2014-11-17 NOTE — Telephone Encounter (Signed)
gv adn printed appt sched and avs for pt for Jan 2016...gv pt barium

## 2014-11-28 ENCOUNTER — Other Ambulatory Visit: Payer: Self-pay | Admitting: *Deleted

## 2014-11-28 DIAGNOSIS — C649 Malignant neoplasm of unspecified kidney, except renal pelvis: Secondary | ICD-10-CM

## 2014-11-28 MED ORDER — PAZOPANIB HCL 200 MG PO TABS: 800.0000 mg | ORAL_TABLET | Freq: Every morning | ORAL | Status: DC

## 2014-12-25 ENCOUNTER — Ambulatory Visit (HOSPITAL_BASED_OUTPATIENT_CLINIC_OR_DEPARTMENT_OTHER): Payer: 59

## 2014-12-25 ENCOUNTER — Telehealth: Payer: Self-pay | Admitting: *Deleted

## 2014-12-25 ENCOUNTER — Other Ambulatory Visit: Payer: Self-pay | Admitting: Medical Oncology

## 2014-12-25 ENCOUNTER — Emergency Department (HOSPITAL_COMMUNITY): Payer: 59

## 2014-12-25 ENCOUNTER — Ambulatory Visit (HOSPITAL_BASED_OUTPATIENT_CLINIC_OR_DEPARTMENT_OTHER): Payer: 59 | Admitting: Nurse Practitioner

## 2014-12-25 ENCOUNTER — Emergency Department (HOSPITAL_COMMUNITY)
Admission: EM | Admit: 2014-12-25 | Discharge: 2014-12-25 | Disposition: A | Discharge status: Home / Self Care | Payer: 59 | Attending: Emergency Medicine | Admitting: Emergency Medicine

## 2014-12-25 ENCOUNTER — Encounter: Payer: Self-pay | Admitting: Nurse Practitioner

## 2014-12-25 ENCOUNTER — Encounter: Payer: Self-pay | Admitting: *Deleted

## 2014-12-25 VITALS — BP 150/101 | HR 67 | Temp 97.7°F | Resp 17 | Ht 75.0 in | Wt 202.6 lb

## 2014-12-25 DIAGNOSIS — C799 Secondary malignant neoplasm of unspecified site: Secondary | ICD-10-CM

## 2014-12-25 DIAGNOSIS — Z87891 Personal history of nicotine dependence: Secondary | ICD-10-CM | POA: Insufficient documentation

## 2014-12-25 DIAGNOSIS — C7889 Secondary malignant neoplasm of other digestive organs: Secondary | ICD-10-CM | POA: Diagnosis not present

## 2014-12-25 DIAGNOSIS — R202 Paresthesia of skin: Secondary | ICD-10-CM

## 2014-12-25 DIAGNOSIS — M25551 Pain in right hip: Secondary | ICD-10-CM

## 2014-12-25 DIAGNOSIS — C642 Malignant neoplasm of left kidney, except renal pelvis: Secondary | ICD-10-CM | POA: Diagnosis not present

## 2014-12-25 DIAGNOSIS — Z79899 Other long term (current) drug therapy: Secondary | ICD-10-CM | POA: Diagnosis not present

## 2014-12-25 DIAGNOSIS — C649 Malignant neoplasm of unspecified kidney, except renal pelvis: Secondary | ICD-10-CM | POA: Diagnosis present

## 2014-12-25 DIAGNOSIS — R200 Anesthesia of skin: Secondary | ICD-10-CM | POA: Insufficient documentation

## 2014-12-25 DIAGNOSIS — M5431 Sciatica, right side: Secondary | ICD-10-CM | POA: Diagnosis not present

## 2014-12-25 DIAGNOSIS — K6289 Other specified diseases of anus and rectum: Secondary | ICD-10-CM | POA: Diagnosis not present

## 2014-12-25 DIAGNOSIS — F419 Anxiety disorder, unspecified: Secondary | ICD-10-CM | POA: Insufficient documentation

## 2014-12-25 DIAGNOSIS — C7800 Secondary malignant neoplasm of unspecified lung: Secondary | ICD-10-CM | POA: Diagnosis not present

## 2014-12-25 DIAGNOSIS — Z8669 Personal history of other diseases of the nervous system and sense organs: Secondary | ICD-10-CM | POA: Diagnosis not present

## 2014-12-25 DIAGNOSIS — C7951 Secondary malignant neoplasm of bone: Secondary | ICD-10-CM | POA: Insufficient documentation

## 2014-12-25 DIAGNOSIS — R21 Rash and other nonspecific skin eruption: Secondary | ICD-10-CM

## 2014-12-25 LAB — CBC WITH DIFFERENTIAL/PLATELET
BASO%: 0.5 % (ref 0.0–2.0)
Basophils Absolute: 0 10*3/uL (ref 0.0–0.1)
EOS%: 8.9 % — ABNORMAL HIGH (ref 0.0–7.0)
Eosinophils Absolute: 0.6 10*3/uL — ABNORMAL HIGH (ref 0.0–0.5)
HCT: 46 % (ref 38.4–49.9)
HGB: 15.2 g/dL (ref 13.0–17.1)
LYMPH%: 26.8 % (ref 14.0–49.0)
MCH: 34.8 pg — ABNORMAL HIGH (ref 27.2–33.4)
MCHC: 33 g/dL (ref 32.0–36.0)
MCV: 105.3 fL — ABNORMAL HIGH (ref 79.3–98.0)
MONO#: 0.3 10*3/uL (ref 0.1–0.9)
MONO%: 3.8 % (ref 0.0–14.0)
NEUT#: 3.9 10*3/uL (ref 1.5–6.5)
NEUT%: 60 % (ref 39.0–75.0)
Platelets: 116 10*3/uL — ABNORMAL LOW (ref 140–400)
RBC: 4.37 10*6/uL (ref 4.20–5.82)
RDW: 14 % (ref 11.0–14.6)
WBC: 6.5 10*3/uL (ref 4.0–10.3)
lymph#: 1.7 10*3/uL (ref 0.9–3.3)

## 2014-12-25 LAB — URINALYSIS, ROUTINE W REFLEX MICROSCOPIC
Bilirubin Urine: NEGATIVE
Glucose, UA: NEGATIVE mg/dL
Hgb urine dipstick: NEGATIVE
Ketones, ur: NEGATIVE mg/dL
Leukocytes, UA: NEGATIVE
Nitrite: NEGATIVE
Protein, ur: NEGATIVE mg/dL
Specific Gravity, Urine: 1.008 (ref 1.005–1.030)
Urobilinogen, UA: 0.2 mg/dL (ref 0.0–1.0)
pH: 5.5 (ref 5.0–8.0)

## 2014-12-25 LAB — COMPREHENSIVE METABOLIC PANEL (CC13)
ALT: 51 U/L (ref 0–55)
AST: 32 U/L (ref 5–34)
Albumin: 3.8 g/dL (ref 3.5–5.0)
Alkaline Phosphatase: 78 U/L (ref 40–150)
Anion Gap: 9 mEq/L (ref 3–11)
BUN: 10.8 mg/dL (ref 7.0–26.0)
CO2: 30 mEq/L — ABNORMAL HIGH (ref 22–29)
Calcium: 9.2 mg/dL (ref 8.4–10.4)
Chloride: 100 mEq/L (ref 98–109)
Creatinine: 1.3 mg/dL (ref 0.7–1.3)
EGFR: 67 mL/min/{1.73_m2} — ABNORMAL LOW (ref >90)
Glucose: 117 mg/dl (ref 70–140)
Potassium: 4.4 mEq/L (ref 3.5–5.1)
Sodium: 139 mEq/L (ref 136–145)
Total Bilirubin: 0.4 mg/dL (ref 0.20–1.20)
Total Protein: 7.3 g/dL (ref 6.4–8.3)

## 2014-12-25 MED ORDER — OXYCODONE HCL 15 MG PO TABS: 15.0000 mg | ORAL_TABLET | ORAL | Status: DC | PRN

## 2014-12-25 MED ORDER — PREDNISONE 20 MG PO TABS
60.0000 mg | ORAL_TABLET | Freq: Once | ORAL | Status: AC
  Administered 2014-12-25: 60 mg via ORAL
  Filled 2014-12-25: qty 3

## 2014-12-25 MED ORDER — OXYCODONE HCL 5 MG PO TABS
15.0000 mg | ORAL_TABLET | Freq: Once | ORAL | Status: AC
  Administered 2014-12-25: 15 mg via ORAL
  Filled 2014-12-25: qty 3

## 2014-12-25 MED ORDER — GADOBENATE DIMEGLUMINE 529 MG/ML IV SOLN
20.0000 mL | Freq: Once | INTRAVENOUS | Status: AC | PRN
  Administered 2014-12-25: 20 mL via INTRAVENOUS

## 2014-12-25 MED ORDER — HYDROMORPHONE HCL 1 MG/ML IJ SOLN
1.0000 mg | Freq: Once | INTRAMUSCULAR | Status: AC
  Administered 2014-12-25: 1 mg via INTRAVENOUS
  Filled 2014-12-25: qty 1

## 2014-12-25 MED ORDER — GABAPENTIN 100 MG PO CAPS: 100.0000 mg | ORAL_CAPSULE | Freq: Three times a day (TID) | ORAL | Status: DC

## 2014-12-25 MED ORDER — PREDNISONE 10 MG PO TABS
ORAL_TABLET | ORAL | Status: DC
Start: 2014-12-25 — End: 2015-01-31

## 2014-12-25 NOTE — ED Notes (Signed)
Bed: EQ33 Expected date:  Expected time:  Means of arrival:  Comments: Pt from [facility]

## 2014-12-25 NOTE — Assessment & Plan Note (Signed)
Patient is status post radiation therapy to both his left humerus and right sacral region for previously diagnosed bone metastasis.  Patient is currently taking oral Votrient chemotherapy on a daily basis.  Patient was scheduled for a labs and a full body CT for restaging purposes this coming Friday, 12/29/2014.  He was scheduled for a follow-up visit to review his scan results on 01/03/2015.  Given the urgent nature of patient's complaints today-patient was transported to the emergency department for further evaluation.

## 2014-12-25 NOTE — Telephone Encounter (Signed)
THE PAIN HAS BEEN "CONSTANT BONE PAIN TO THE POINT OF BEING NAUSEATED". PT. IS ALSO HAVING NUMBNESS IN HIS RIGHT LEG AND FOOT. HE IS TAKING OXYCODONE 5MG  THREE TIMES A DAY WITHOUT RELIEF. PT. HAS ELEVATED HIS LEG AND USED A HEATING PAD WITHOUT RELIEF. VERBAL ORDER AND READ BACK TO CINDEE BACON,NP- PT. NEEDS A CBC WITH DIFF AND CMET STAT PLUS AN OFFICE VISIT. POF SENT URGENT TO SCHEDULER. NOTIFIED CINDEE BACON'S NURSE, DIANE BELL,RN AND NURSE TECH OF PT.'S VISIT.

## 2014-12-25 NOTE — Discharge Instructions (Signed)
1. Medications: Roxicodone, Neurontin, prednisone, usual home medications 2. Treatment: rest, drink plenty of fluids,  3. Follow Up: Please followup with your primary doctor and/or oncologist in 3-5 days for discussion of your diagnoses and further evaluation after today's visit; if you do not have a primary care doctor use the resource guide provided to find one; Please return to the ER for worsening pain, or other concerns   Radicular Pain Radicular pain in either the arm or leg is usually from a bulging or herniated disk in the spine. A piece of the herniated disk may press against the nerves as the nerves exit the spine. This causes pain which is felt at the tips of the nerves down the arm or leg. Other causes of radicular pain may include:  Fractures.  Heart disease.  Cancer.  An abnormal and usually degenerative state of the nervous system or nerves (neuropathy). Diagnosis may require CT or MRI scanning to determine the primary cause.  Nerves that start at the neck (nerve roots) may cause radicular pain in the outer shoulder and arm. It can spread down to the thumb and fingers. The symptoms vary depending on which nerve root has been affected. In most cases radicular pain improves with conservative treatment. Neck problems may require physical therapy, a neck collar, or cervical traction. Treatment may take many weeks, and surgery may be considered if the symptoms do not improve.  Conservative treatment is also recommended for sciatica. Sciatica causes pain to radiate from the lower back or buttock area down the leg into the foot. Often there is a history of back problems. Most patients with sciatica are better after 2 to 4 weeks of rest and other supportive care. Short term bed rest can reduce the disk pressure considerably. Sitting, however, is not a good position since this increases the pressure on the disk. You should avoid bending, lifting, and all other activities which make the problem  worse. Traction can be used in severe cases. Surgery is usually reserved for patients who do not improve within the first months of treatment. Only take over-the-counter or prescription medicines for pain, discomfort, or fever as directed by your caregiver. Narcotics and muscle relaxants may help by relieving more severe pain and spasm and by providing mild sedation. Cold or massage can give significant relief. Spinal manipulation is not recommended. It can increase the degree of disc protrusion. Epidural steroid injections are often effective treatment for radicular pain. These injections deliver medicine to the spinal nerve in the space between the protective covering of the spinal cord and back bones (vertebrae). Your caregiver can give you more information about steroid injections. These injections are most effective when given within two weeks of the onset of pain.  You should see your caregiver for follow up care as recommended. A program for neck and back injury rehabilitation with stretching and strengthening exercises is an important part of management.  SEEK IMMEDIATE MEDICAL CARE IF:  You develop increased pain, weakness, or numbness in your arm or leg.  You develop difficulty with bladder or bowel control.  You develop abdominal pain. Document Released: 01/15/2005 Document Revised: 03/01/2012 Document Reviewed: 04/02/2009 [facility] Patient Information 2015 [place], [place]. This information is not intended to replace advice given to you by your health care provider. Make sure you discuss any questions you have with your health care provider.

## 2014-12-25 NOTE — ED Notes (Signed)
Pt to MRI

## 2014-12-25 NOTE — ED Notes (Signed)
Pt back from MRI 

## 2014-12-25 NOTE — Assessment & Plan Note (Signed)
Patient reports developing a generalized/full-body rash intermittently within the past few weeks as well.  He states that this rash is occasionally pruritic; and comes/goes.  On exam-patient does have a fine, raised papular pink/red rash to his bilateral thighs and his chest wall.  No evidence of hives.  No evidence of active infection.

## 2014-12-25 NOTE — ED Notes (Signed)
Pt, being sent from [place], to r/o cord compression and/or worsening mets.  C/o intermittent urinary incontinence, R leg numbness, and severe R hip pain x 2-3 weeks.  Sending provider, Retta Mac NP, can be reached at [phone].

## 2014-12-25 NOTE — ED Notes (Signed)
Pt with Hx of metastatic renal cell carcinoma with mets to lungs and pancreas reports to ED for right leg numbness following radiation one year ago, states numbness has worsened in past 3 weeks. Pt forced to come to ED by family member.

## 2014-12-25 NOTE — Assessment & Plan Note (Signed)
Previously dx'd with mets to lungs, pancreas, left humeral bone, and right sacral bone. Completed radiation therapy for mets to sacral area in 2014; and completed RT to left shoulder in 2015.  Complain of chronic pain to right sacral region.  Patient is complaining of progressive severe pain to the right sacral region that radiates  anteriorly to posteriorly for the last 2-3 weeks.  Patient is also complaining of some numbness and tingling from his right mid thigh down to his toes.  Upon further questioning-patient also admits to  urinary incontinence on occasion within the past few weeks as well.  Patient denies any known injury or trauma to his back recently.  He has been taking oxycodone 2 tablets every 6 hours with minimal effectiveness.  On exam-patient is walking with a limp to the right leg.  Continues able to lift himself up out of a chair with no difficulty however.  No specific weakness to right lower extremity on exam.  Patient will be transported to the emergency department for further evaluation of possible progressive sacral metastasis versus cord compression.  Brief report and history were called to Altha Harm, ED charge nurse.  Patient was transported to the ER via wheelchair.
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
## 2014-12-26 ENCOUNTER — Telehealth: Payer: Self-pay

## 2014-12-26 NOTE — Telephone Encounter (Signed)
Mr. Lafitte began a Prednisone taper today from the ED for neuritis.  H was given Oxyir 15 mg tabs to take 1 q 4 hours prn pain.  Patient states that his pain is a 6/10 currently having just taken an OxyIr tab ~90 minutes prior.   He will have Ct 12-29-14 and see Dr. [name]  For follow up 01-03-15.  Cynthia bacon notified of information from patient.  He will call if pain worse prior to visit on 01-03-15.

## 2014-12-29 ENCOUNTER — Ambulatory Visit (HOSPITAL_COMMUNITY)
Admission: RE | Admit: 2014-12-29 | Discharge: 2014-12-29 | Disposition: A | Discharge status: Home / Self Care | Payer: 59 | Source: Ambulatory Visit | Attending: Oncology | Admitting: Oncology

## 2014-12-29 ENCOUNTER — Other Ambulatory Visit (HOSPITAL_BASED_OUTPATIENT_CLINIC_OR_DEPARTMENT_OTHER): Payer: 59

## 2014-12-29 DIAGNOSIS — C7889 Secondary malignant neoplasm of other digestive organs: Secondary | ICD-10-CM

## 2014-12-29 DIAGNOSIS — Z9889 Other specified postprocedural states: Secondary | ICD-10-CM | POA: Insufficient documentation

## 2014-12-29 DIAGNOSIS — C649 Malignant neoplasm of unspecified kidney, except renal pelvis: Secondary | ICD-10-CM | POA: Diagnosis present

## 2014-12-29 DIAGNOSIS — Z79899 Other long term (current) drug therapy: Secondary | ICD-10-CM | POA: Insufficient documentation

## 2014-12-29 DIAGNOSIS — Z923 Personal history of irradiation: Secondary | ICD-10-CM | POA: Insufficient documentation

## 2014-12-29 DIAGNOSIS — C642 Malignant neoplasm of left kidney, except renal pelvis: Secondary | ICD-10-CM

## 2014-12-29 DIAGNOSIS — C7801 Secondary malignant neoplasm of right lung: Secondary | ICD-10-CM

## 2014-12-29 DIAGNOSIS — C259 Malignant neoplasm of pancreas, unspecified: Secondary | ICD-10-CM | POA: Insufficient documentation

## 2014-12-29 LAB — CBC WITH DIFFERENTIAL/PLATELET
BASO%: 0.2 % (ref 0.0–2.0)
Basophils Absolute: 0 10*3/uL (ref 0.0–0.1)
EOS%: 0.8 % (ref 0.0–7.0)
Eosinophils Absolute: 0.1 10*3/uL (ref 0.0–0.5)
HCT: 45.2 % (ref 38.4–49.9)
HGB: 14.7 g/dL (ref 13.0–17.1)
LYMPH%: 15.6 % (ref 14.0–49.0)
MCH: 35 pg — ABNORMAL HIGH (ref 27.2–33.4)
MCHC: 32.5 g/dL (ref 32.0–36.0)
MCV: 107.6 fL — ABNORMAL HIGH (ref 79.3–98.0)
MONO#: 0.4 10*3/uL (ref 0.1–0.9)
MONO%: 5.8 % (ref 0.0–14.0)
NEUT#: 5.1 10*3/uL (ref 1.5–6.5)
NEUT%: 77.6 % — ABNORMAL HIGH (ref 39.0–75.0)
Platelets: 141 10*3/uL (ref 140–400)
RBC: 4.2 10*6/uL (ref 4.20–5.82)
RDW: 14.6 % (ref 11.0–14.6)
WBC: 6.6 10*3/uL (ref 4.0–10.3)
lymph#: 1 10*3/uL (ref 0.9–3.3)

## 2014-12-29 LAB — COMPREHENSIVE METABOLIC PANEL (CC13)
ALT: 33 U/L (ref 0–55)
AST: 17 U/L (ref 5–34)
Albumin: 3.8 g/dL (ref 3.5–5.0)
Alkaline Phosphatase: 70 U/L (ref 40–150)
Anion Gap: 8 mEq/L (ref 3–11)
BUN: 17.9 mg/dL (ref 7.0–26.0)
CO2: 31 mEq/L — ABNORMAL HIGH (ref 22–29)
Calcium: 9.1 mg/dL (ref 8.4–10.4)
Chloride: 98 mEq/L (ref 98–109)
Creatinine: 1.2 mg/dL (ref 0.7–1.3)
EGFR: 72 mL/min/{1.73_m2} — ABNORMAL LOW (ref >90)
Glucose: 162 mg/dl — ABNORMAL HIGH (ref 70–140)
Potassium: 4.2 mEq/L (ref 3.5–5.1)
Sodium: 137 mEq/L (ref 136–145)
Total Bilirubin: 0.5 mg/dL (ref 0.20–1.20)
Total Protein: 7.1 g/dL (ref 6.4–8.3)

## 2014-12-29 MED ORDER — IOHEXOL 300 MG/ML  SOLN
100.0000 mL | Freq: Once | INTRAMUSCULAR | Status: AC | PRN
  Administered 2014-12-29: 100 mL via INTRAVENOUS

## 2015-01-03 ENCOUNTER — Telehealth: Payer: Self-pay | Admitting: Oncology

## 2015-01-03 ENCOUNTER — Ambulatory Visit (HOSPITAL_BASED_OUTPATIENT_CLINIC_OR_DEPARTMENT_OTHER): Payer: 59 | Admitting: Oncology

## 2015-01-03 VITALS — BP 143/98 | HR 67 | Temp 98.3°F | Resp 19 | Ht 76.0 in | Wt 208.8 lb

## 2015-01-03 DIAGNOSIS — C7801 Secondary malignant neoplasm of right lung: Secondary | ICD-10-CM

## 2015-01-03 DIAGNOSIS — C7989 Secondary malignant neoplasm of other specified sites: Secondary | ICD-10-CM

## 2015-01-03 DIAGNOSIS — R52 Pain, unspecified: Secondary | ICD-10-CM

## 2015-01-03 DIAGNOSIS — C649 Malignant neoplasm of unspecified kidney, except renal pelvis: Secondary | ICD-10-CM

## 2015-01-03 DIAGNOSIS — C642 Malignant neoplasm of left kidney, except renal pelvis: Secondary | ICD-10-CM

## 2015-01-03 DIAGNOSIS — M25551 Pain in right hip: Secondary | ICD-10-CM

## 2015-01-03 DIAGNOSIS — M899 Disorder of bone, unspecified: Secondary | ICD-10-CM

## 2015-01-03 MED ORDER — GABAPENTIN 300 MG PO CAPS: 300.0000 mg | ORAL_CAPSULE | Freq: Three times a day (TID) | ORAL | Status: DC

## 2015-01-03 MED ORDER — ALPRAZOLAM 1 MG PO TABS: ORAL_TABLET | ORAL | Status: DC

## 2015-01-03 MED ORDER — HYDROMORPHONE HCL 4 MG PO TABS
4.0000 mg | ORAL_TABLET | ORAL | Status: DC | PRN
Start: 2015-01-03 — End: 2015-01-31

## 2015-01-03 NOTE — Progress Notes (Signed)
Hematology and Oncology Follow Up [name]  Keith [name] [id number] April 12, 1966 49 y.o. 01/03/2015 12:41 PM  [name], MD  [name], M.D.  [name], MD  [name], MD    Principle Diagnosis: This is a 49 year old gentleman with stage IV renal cell carcinoma diagnosed in 2009.  Prior Therapy: 1. Status post laparoscopic radical nephrectomy.  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  2. Patient status post thoracotomy for a synchronous metastatic lung lesions done October 2009.  He had a lower lobe nodule, biopsy proven to be metastatic renal cell carcinoma.   3. Patient is status post stereotactic radiotherapy to pulmonary nodules in May of 2010. 4. He is S/P Sutent 50 mg 4 weeks on 2 weeks off from 10/2010 to 03/2013. He progressed at that time.  5. He is S/P radiation to the right sacral bone between 4/22 to 4/30.  6. He is S/P XRT to the left shoulder 03/20/14 to 03/31/14.   Current therapy: He is on Votrient 800 mg since 03/2013. His disease include lung lesions, pancreatic as well as right sacral lesion.   Interim History:  Keith [name]. Since his last [name], he is reporting increased pain in his right hip and pelvic area. He was evaluated in the emergency department on 12/25/2014. He had imaging studies of the spine and hip area which for the most part showed stable disease. His pain is predominantly neuropathic originating from the right groin and the right hip area and shooting down his leg. He was started on gabapentin without any significant improvement at this time. He did not report any new complications or illnesses from Votrient.  He has not reported any nausea or vomiting. He is not reporting any constipation or diarrhea. He is not reporting any neurological symptoms. Has not reported any recent hospitalizations or illnesses. He did not report any headaches blurred vision or double vision. Does not  report any seizure activity or alteration of mental status. Is not reporting any chest pain or difficulty breathing. Is not reporting any cough or hemoptysis. Does not report any abdominal pain or hematochezia. Does not report any hematuria but does report occasional hesitancy and nocturia. He does not report any rashes or lesions or petechiae. He does not report any lymphadenopathy. His performance status and activity level remain stable at this time. Rest of his review of system is unremarkable.  Medications: I have reviewed the patient's current medications.  Current Outpatient Prescriptions  Medication Sig Dispense Refill  . ALPRAZolam (XANAX) 1 MG tablet TAKE 1 TABLET EVERY 8 HOURS AS NEEDED FOR ANXIETY 100 tablet 0  . DULoxetine (CYMBALTA) 60 MG capsule TAKE 1 CAPSULE TWICE DAILY 60 capsule 2  . ibuprofen (ADVIL,MOTRIN) 800 MG tablet Take 800 mg by mouth every 8 (eight) hours as needed.    . pazopanib (VOTRIENT) 200 MG tablet Take 4 tablets (800 mg total) by mouth every morning. Take on an empty stomach. 120 tablet 0  . predniSONE (DELTASONE) 10 MG tablet 4 tabs po daily x 3 days, then 3 tabs x 3 days, then 2 tabs x 3 days, then 1 tab x 3 days, then 0.5 tabs x 4 days 42 tablet 0  . VIAGRA 100 MG tablet Take 50 mg by mouth as needed for erectile dysfunction.     . gabapentin (NEURONTIN) 300 MG capsule Take 1 capsule (300 mg total) by mouth 3 (three) times daily. 90 capsule 3  .  HYDROmorphone (DILAUDID) 4 MG tablet Take 1 tablet (4 mg total) by mouth every 4 (four) hours as needed for severe pain. 30 tablet 0   No current facility-administered medications for this [name].     Allergies:  Allergies  Allergen Reactions  . Ceftriaxone Hives  . Hydrocodone Swelling    Past Medical History, Surgical history, Social history, and Family History were reviewed and updated.   Physical Exam: Blood pressure 143/98, pulse 67, temperature 98.3 F (36.8 C), temperature source Oral, resp. rate 19,  height 6\' 4"  (1.93 m), weight 208 lb 12.8 oz (94.711 kg), SpO2 100 %. ECOG: 1 General appearance: alert and awake chronically ill-appearing.  Head: Normocephalic, without obvious abnormality.  Neck: no adenopathy, no masses.  Lymph nodes: Cervical, supraclavicular, and axillary nodes normal. Heart:regular rate and rhythm, S1, S2.  Lung:chest clear, no wheezing, rales, normal symmetric air entry. No dullness to percussion.  Abdomen: soft, non-tender, without masses or organomegaly EXT:no edema, no despumation. No erythema noted.   Neuro: no focal deficits noted. No weakness noted in his lower extremities.  Lab Results: Lab Results  Component Value Date   WBC 6.6 12/29/2014   HGB 14.7 12/29/2014   HCT 45.2 12/29/2014   MCV 107.6* 12/29/2014   PLT 141 12/29/2014    EXAM: CT CHEST AND PELVIS WITH CONTRAST  CT ABDOMEN WITH AND WITHOUT CONTRAST  TECHNIQUE: Multidetector CT imaging of the chest and pelvis was performed during intravenous contrast administration. Multidetector CT imaging of the abdomen was performed following the standard protocol before and during bolus administration of intravenous contrast.  CONTRAST: 195mL OMNIPAQUE IOHEXOL 300 MG/ML SOLN  COMPARISON: 09/19/2014.  FINDINGS: CT CHEST FINDINGS  No pathologically enlarged mediastinal lymph nodes. There may be a 9 mm left hilar lymph node. No axillary adenopathy. Heart is at the upper limits of normal in size. No pericardial effusion.  Centrilobular and paraseptal emphysema with bullous changes at the apex of the right hemi thorax. Postoperative changes of right upper lobectomy. Nodular and linear soft tissue density in the upper right hemi thorax is unchanged and presumably related to radiation therapy. A nodular lesion in the central right middle lobe measures approximately 1.4 x 2.2 cm (series 8, image 60), stable. Scattered scarring and volume loss in the right hemi thorax, as before. 4  mm subpleural right middle lobe nodule (image 48, series 8), unchanged. Left lung is clear. No pleural fluid. Adherent debris is seen along the lower trachea and right mainstem bronchus.  CT ABDOMEN AND PELVIS FINDINGS  Hepatobiliary: Liver and gallbladder are unremarkable. No biliary ductal dilatation.  Pancreas: Pancreatic neck mass measures 1.7 x 2.3 cm, stable when remeasured on the prior exam. Pancreatic body mass is somewhat difficult to measure but appears roughly stable, measuring 1.9 x 2.7 cm (series 6, image 99). Associated ductal dilatation, measuring up to 1 cm.  Spleen: Negative.  Adrenals/Urinary Tract: Adrenal glands are unremarkable. No urinary stones. Right kidney is unremarkable. Right ureter is decompressed. Left nephrectomy. Bladder is grossly unremarkable.  Stomach/Bowel: Stomach, small bowel, appendix and colon are unremarkable.  Vascular/Lymphatic: Atherosclerotic calcification of the arterial vasculature without abdominal aortic aneurysm. No vascular encasement. No pathologically enlarged lymph nodes.  Reproductive: Prostate is normal in size.  Other: No free fluid. Mesenteries and peritoneum are unremarkable.  Musculoskeletal: A lucent lesion in the right sacrum measures 2.3 x 3.0 cm, stable.  IMPRESSION: 1. Postoperative and postradiation changes in the right hemi thorax, stable, without evidence of disease progression. 2. Stable pancreatic masses.  3. Lytic right sacral lesion, stable.  Impression and Plan:  This is a pleasant 49 year old gentleman with the following issues. 1. Metastatic renal cell carcinoma.  He has documented disease to the lung and the pancreas.  He tolerating Votrient without complications. CT scan from 12/2014 0 reviewed today and showed mostly stable disease. The plan is to continue with the current dose and schedule. 2. Pancreatic thickening/mass.  This has been biopsy proven to be a renal cell metastasis. No  change in treatment at this time.  3. Depression/Anxiety: He remains on Xanax and Cymbalta. His blood appears to be stable. 4. Weight loss: This seems to be improved at this time. 5. Sacral mass: S/P radiation. Reports slight pain in that area. I have increased his gabapentin to 300 mg 3 times a day I also referred him to neurosurgery for an evaluation. I'm hoping for possible intervention to relieve some of his pain in the hip area. 6. Neuropathic pain. He is on gabapentin and Dilaudid for pain. I have discontinued oxycodone due to lack of effectiveness. 7. Follow up: 12/2014.  [place]

## 2015-01-03 NOTE — Telephone Encounter (Signed)
gv and printed appt sched and avs for pt for Feb....gv Keith H. Referral for [facility] neurosurgery...Keith KitchenMarland [name] will contact pt

## 2015-01-04 ENCOUNTER — Telehealth: Payer: Self-pay | Admitting: Oncology

## 2015-01-04 NOTE — Telephone Encounter (Signed)
Faxed pt medical records to [facility]

## 2015-01-05 ENCOUNTER — Other Ambulatory Visit: Payer: Self-pay | Admitting: *Deleted

## 2015-01-05 DIAGNOSIS — C649 Malignant neoplasm of unspecified kidney, except renal pelvis: Secondary | ICD-10-CM

## 2015-01-05 MED ORDER — PAZOPANIB HCL 200 MG PO TABS: 800.0000 mg | ORAL_TABLET | Freq: Every morning | ORAL | Status: DC

## 2015-01-09 ENCOUNTER — Encounter: Payer: Self-pay | Admitting: *Deleted

## 2015-01-11 IMAGING — MR MR PELVIS WO/W CM
4 of 8 series · 19 of 48 positions shown · IV contrast (Y)
Comparison: Multiple exams, including 03/09/2013 and 12/03/2012

CLINICAL DATA: Metastatic renal cell carcinoma.  Left low back
pain.

MRI PELVIS WITHOUT AND WITH CONTRAST
TECHNIQUE: Multiplanar multisequence MR imaging of the pelvis was
performed both before and after administration of intravenous
contrast.
Contrast:  20 ml Multihance

[Series 3: T1 · axial · 8.0mm · 0.82mm/px · z∈[-64,+236]mm · 7 of 31 slices shown (1 of 2)]
[im 1/31]
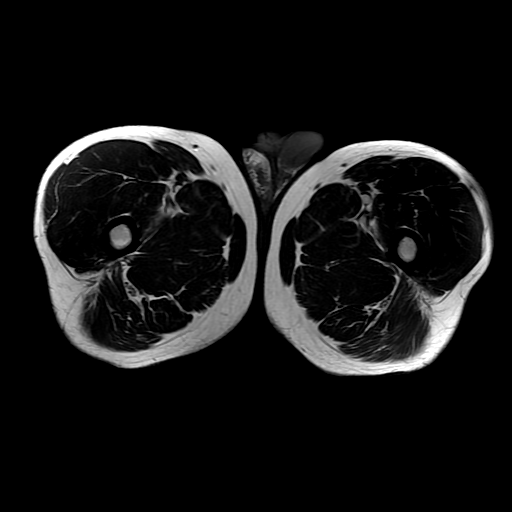
[im 6/31]
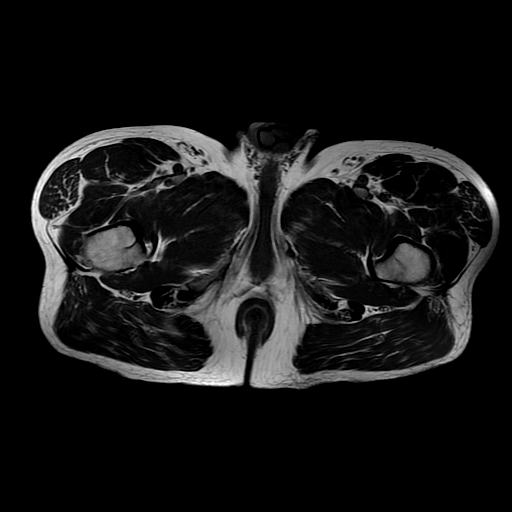
[im 11/31]
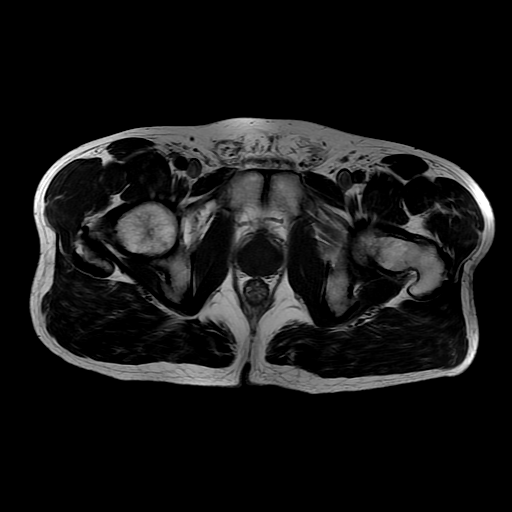
[im 16/31]
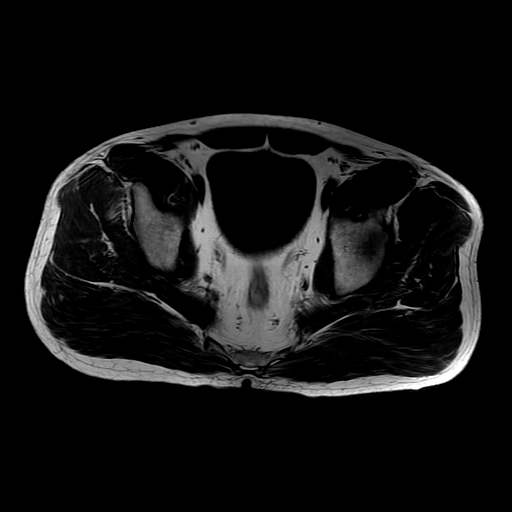
[im 21/31]
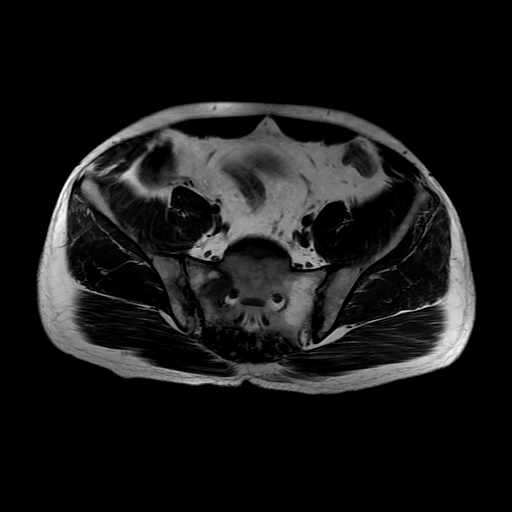
[im 26/31]
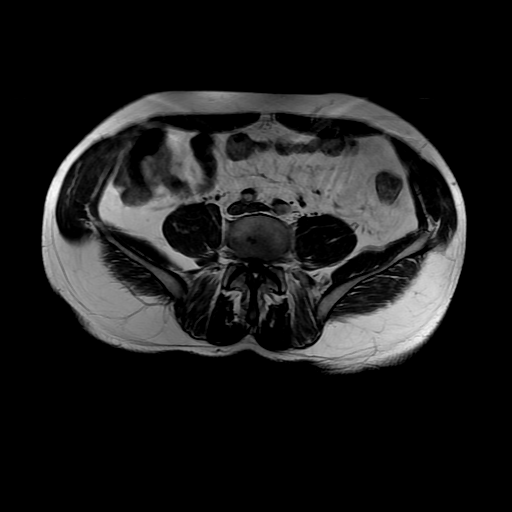
[im 31/31]
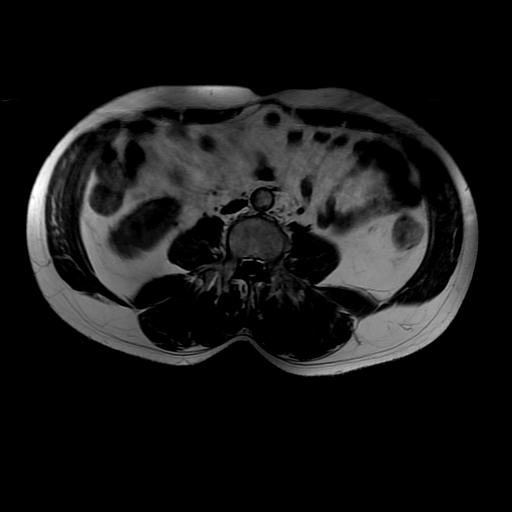

[Series 4: T2 fat-sat · axial · 8.0mm · 0.82mm/px · z∈[-66,+234]mm · 6 of 31 slices shown]
[im 1/31]
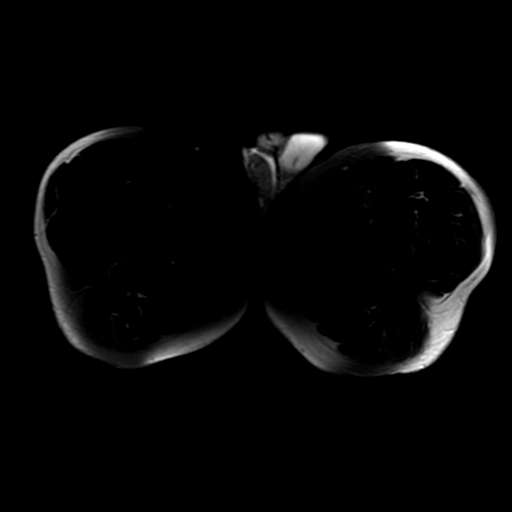
[im 7/31]
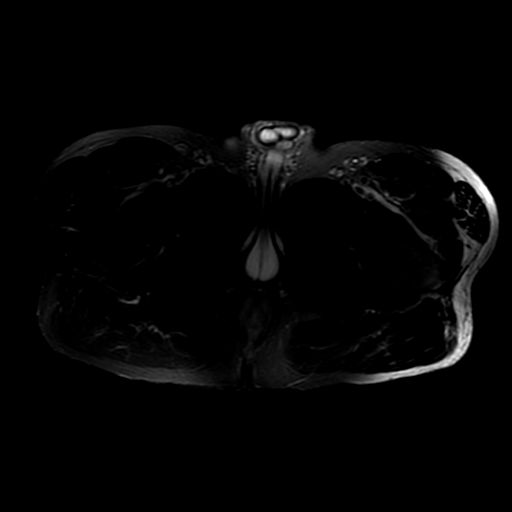
[im 13/31]
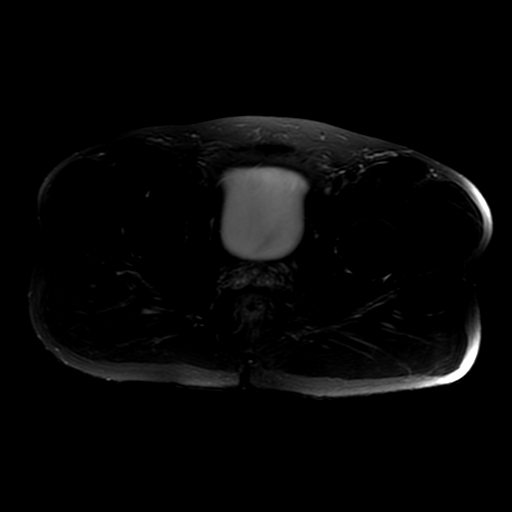
[im 19/31]
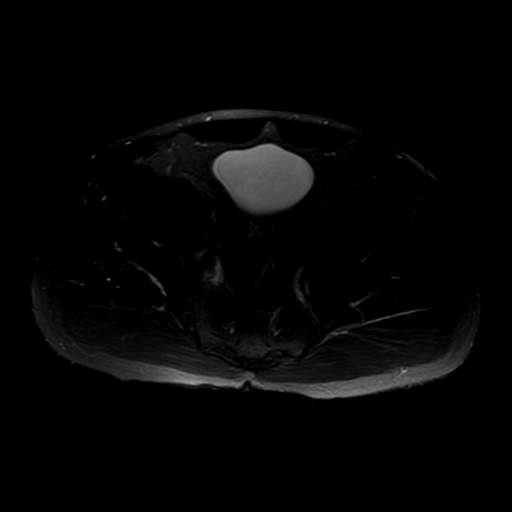
[im 25/31]
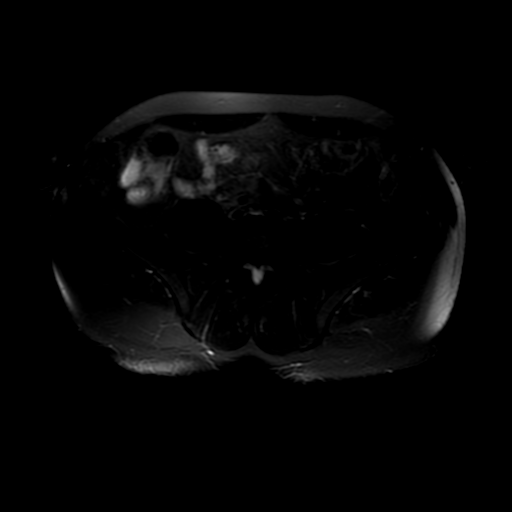
[im 31/31]
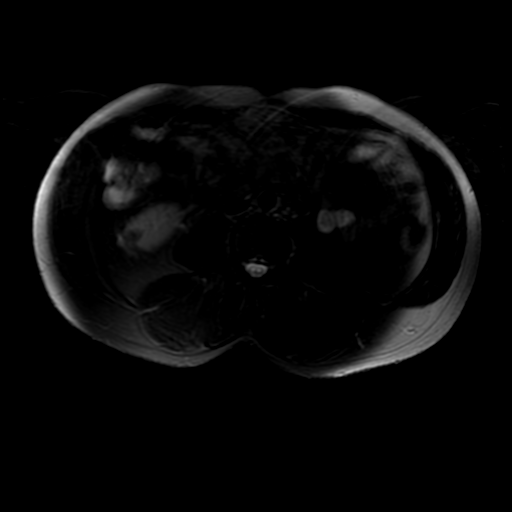

[Series 5: T1 · coronal · 5.0mm · 0.86mm/px · 3 of 28 slices shown (2 of 2)]
[im 1/28]
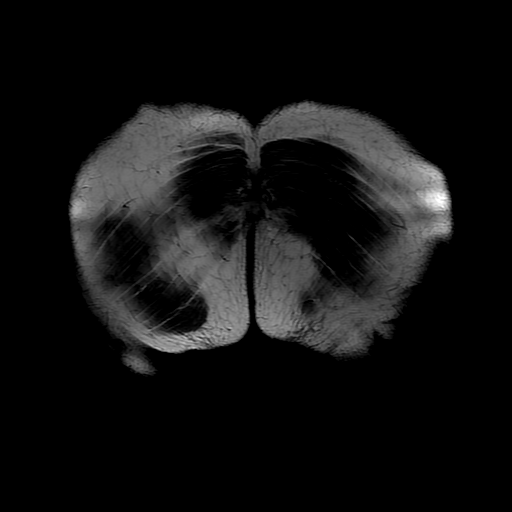
[im 14/28]
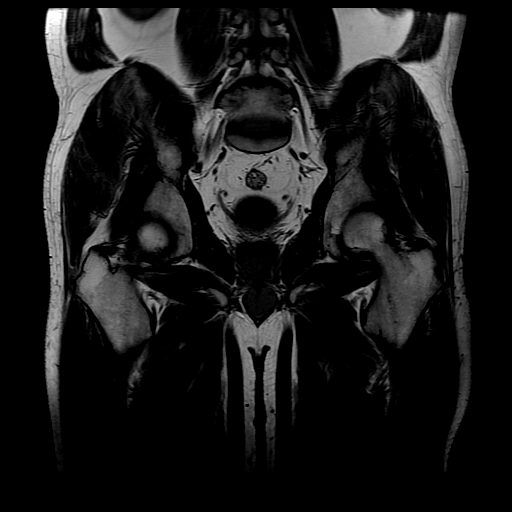
[im 28/28]
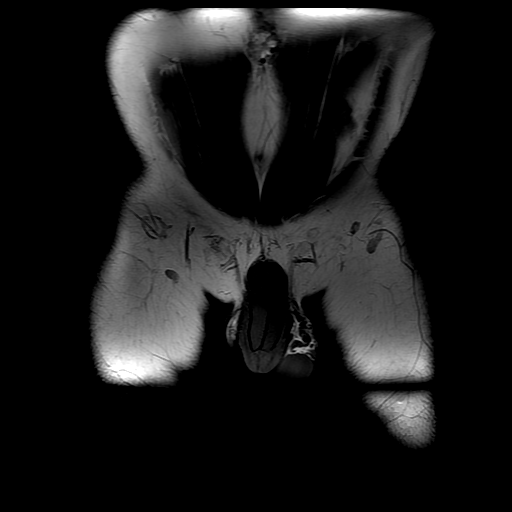

[Series 6: cor fse ir · coronal · 5.0mm · 0.86mm/px · 3 of 28 slices shown]
[im 1/28]
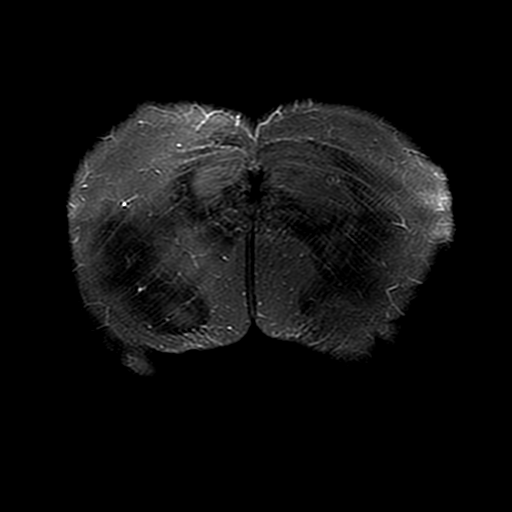
[im 14/28]
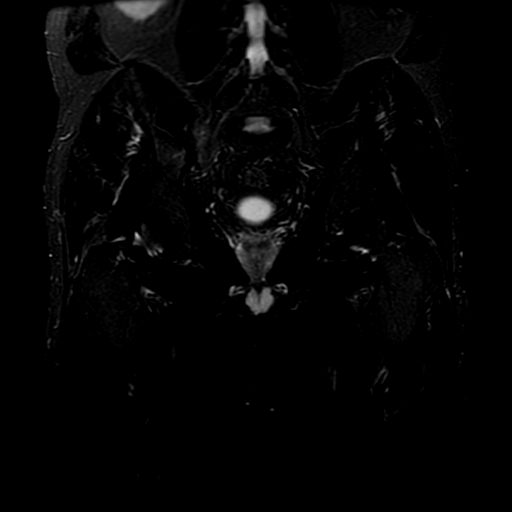
[im 28/28]
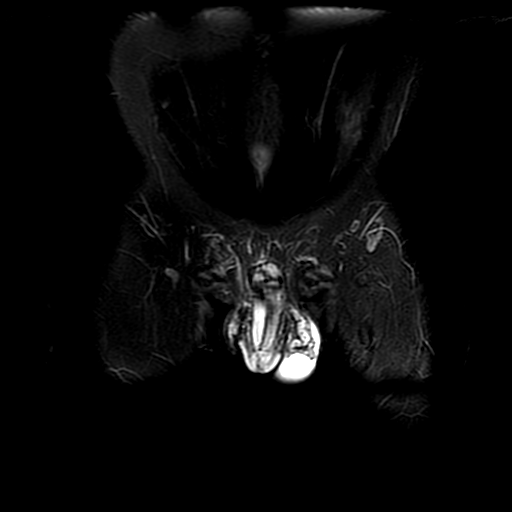

[19 of 48 positions shown; findings below may reference images not displayed]

FINDINGS: Eight T2 hyperintense, enhancing lesion in the right
sacrum is observed with the overall region of abnormal signal
measuring 4.4 x 3.2 x 3.5 cm.  This extends near the right S1
foramen but does not obviously invade the foramen or extend beyond
the bony cortex.  No transverse edema in the sacrum, or other bony
pelvic enhancing lesions noted.  No invasion across the sacroiliac
joint on the right.

Pubic symphysis appears normal.  Proximal hamstring tendons
unremarkable.  The hip adductor musculature appears normal.

No findings of avascular necrosis or hip effusion.  No regional
bursitis.  No sciatic notch or obturator impingement noted.  No
regional soft tissue metastatic disease.
IMPRESSION: 1.  Enhancing mass in the right sacrum most compatible with osseous
metastatic disease.  No extraosseous extension.

## 2015-01-11 IMAGING — MR MR LUMBAR SPINE WO/W CM
4 of 7 series · 19 of 48 positions shown · IV contrast (yes)
Comparison: 12/03/2012

CLINICAL DATA: Renal cell carcinoma.  Left back and leg pain.

MRI LUMBAR SPINE WITHOUT AND WITH CONTRAST
TECHNIQUE: Multiplanar and multiecho pulse sequences of the lumbar
spine were obtained without and with intravenous contrast.
Contrast: 20mL MULTIHANCE GADOBENATE DIMEGLUMINE 529 MG/ML IV SOLN

[Series 4: T2 · axial · 5.0mm · 0.39mm/px · z∈[-210,+39]mm · 8 of 35 slices shown (1 of 2)]
[im 1/35]
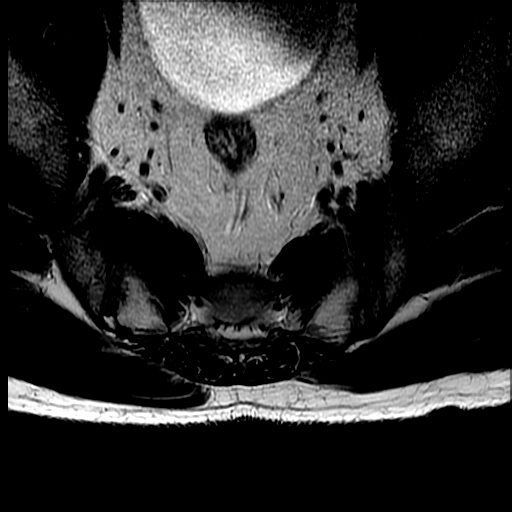
[im 4/35]
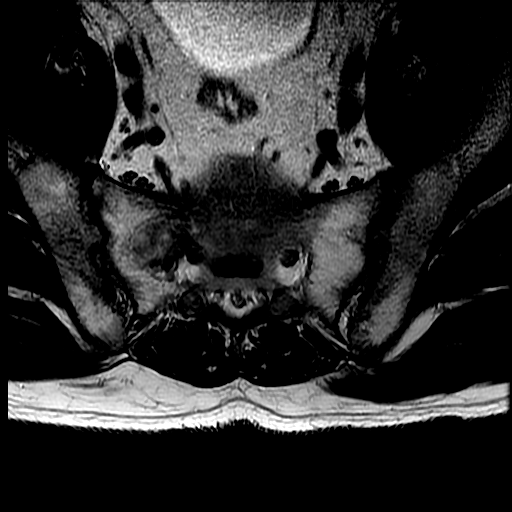
[im 12/35]
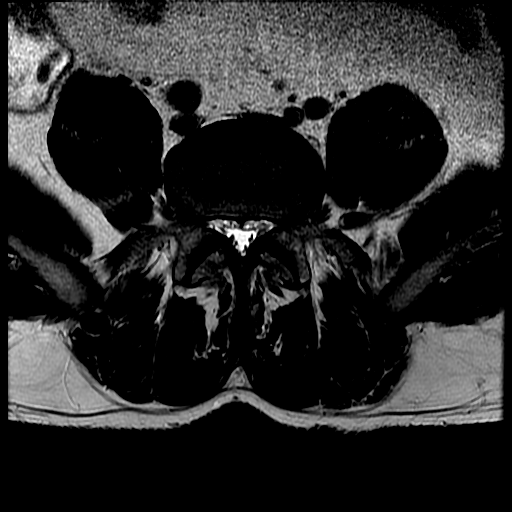
[im 16/35]
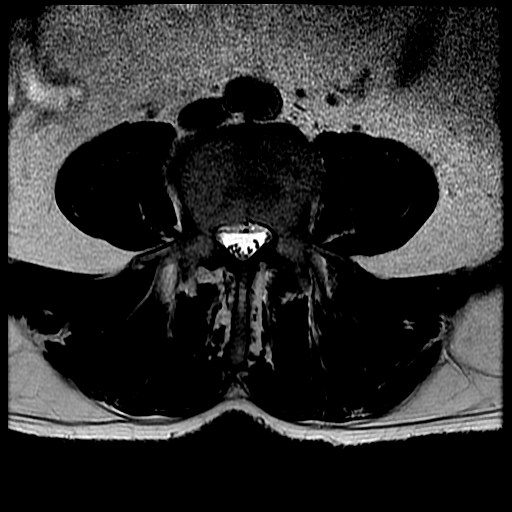
[im 19/35]
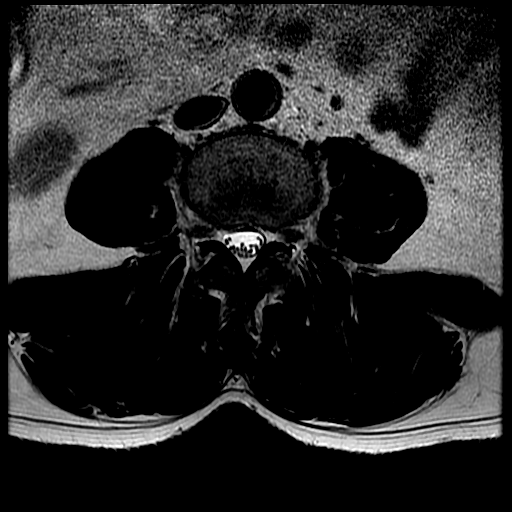
[im 23/35]
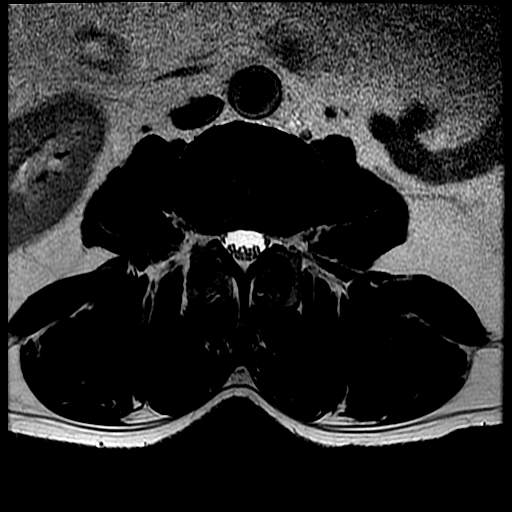
[im 31/35]
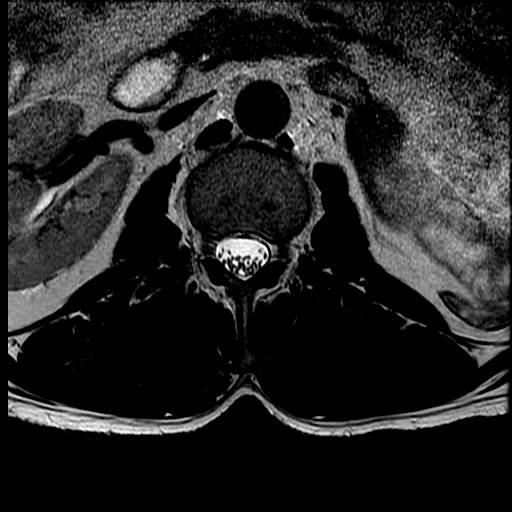
[im 35/35]
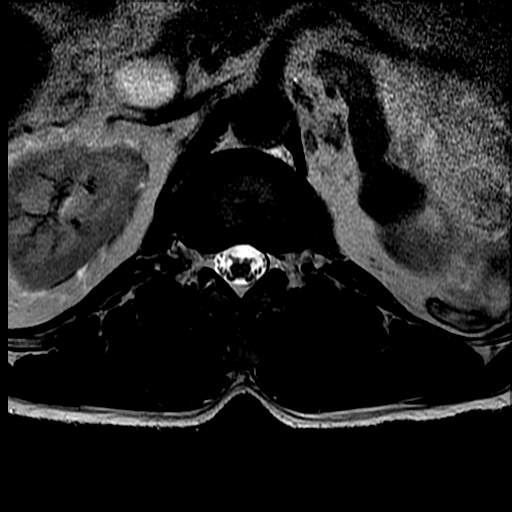

[Series 4: T1 · sagittal · 4.0mm · 0.55mm/px · 4 of 13 slices shown (1 of 2)]
[im 1/13]
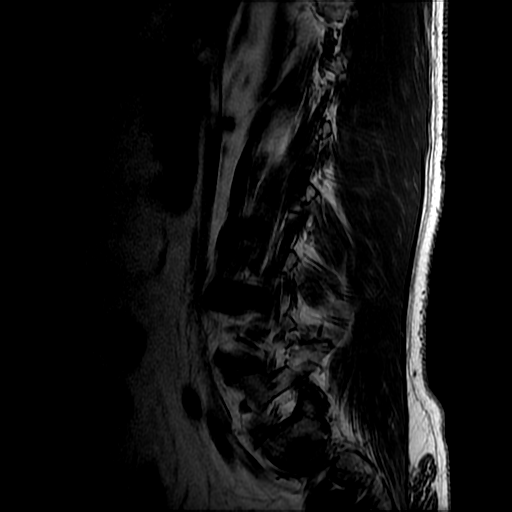
[im 5/13]
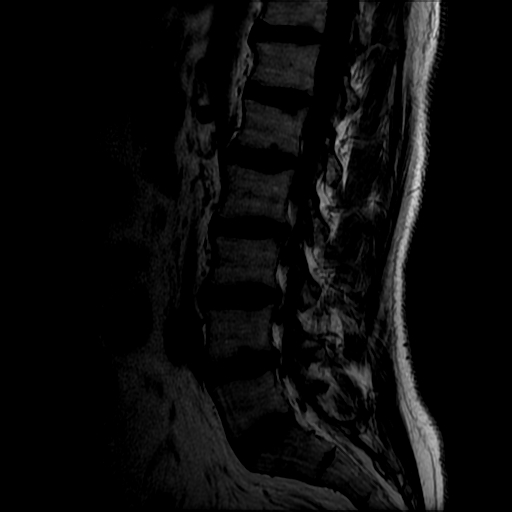
[im 9/13]
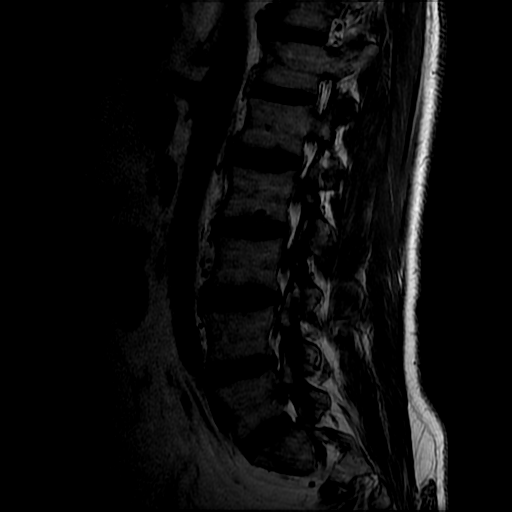
[im 13/13]
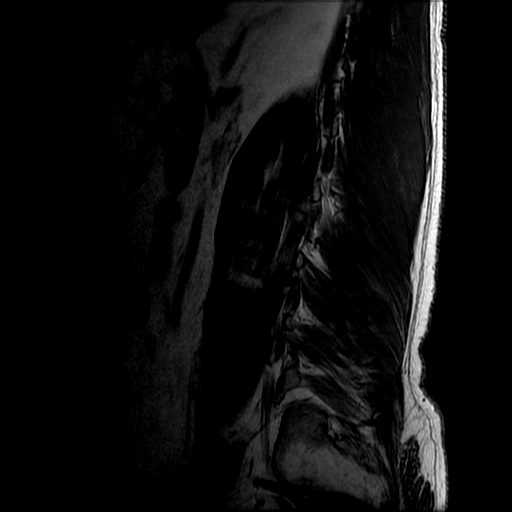

[Series 5: T2 · sagittal · 4.0mm · 0.55mm/px · 4 of 13 slices shown (2 of 2)]
[im 1/13]
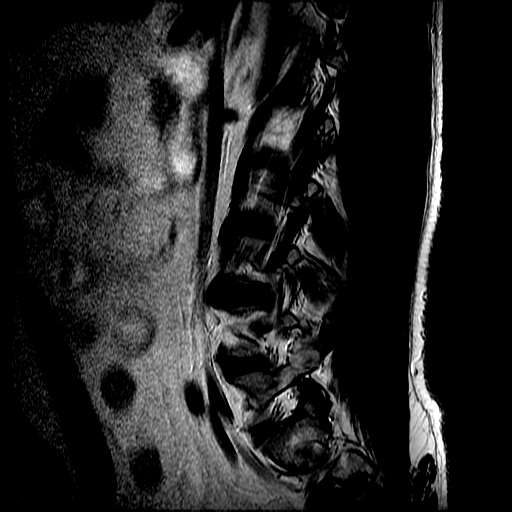
[im 5/13]
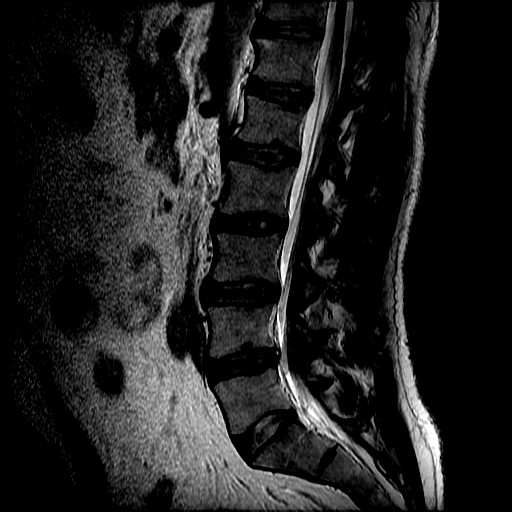
[im 9/13]
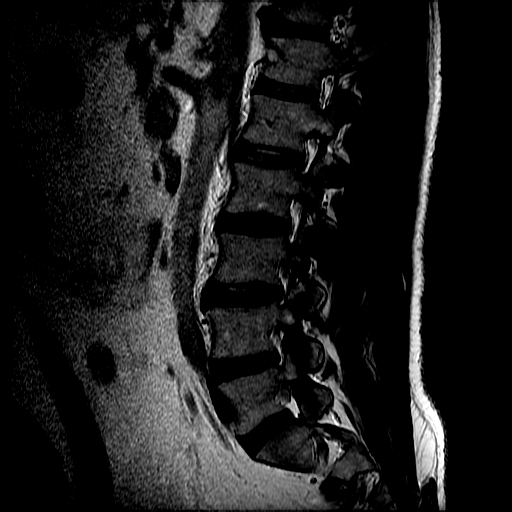
[im 13/13]
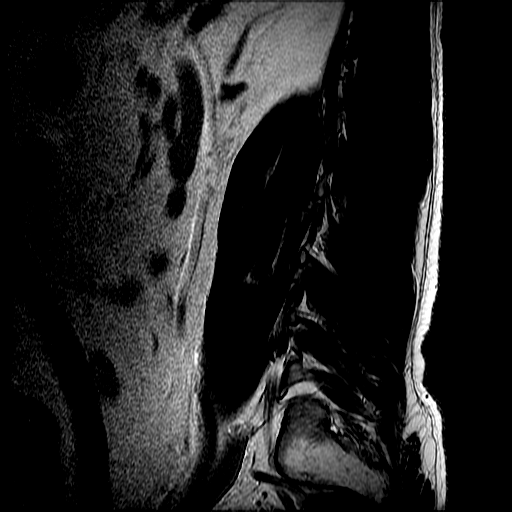

[Series 5: T1 · axial · 5.0mm · 0.39mm/px · z∈[-191,+13]mm · 3 of 35 slices shown (2 of 2)]
[im 4/35]
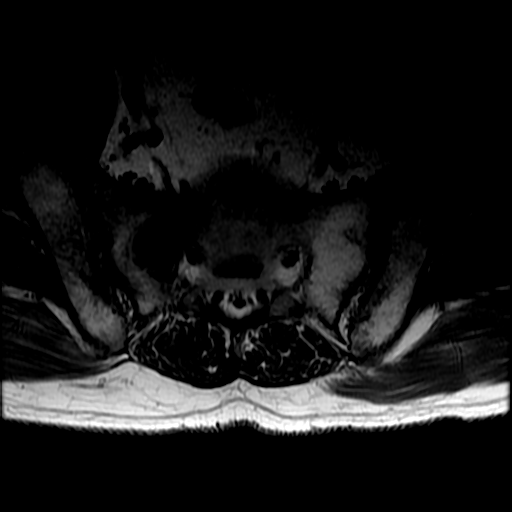
[im 19/35]
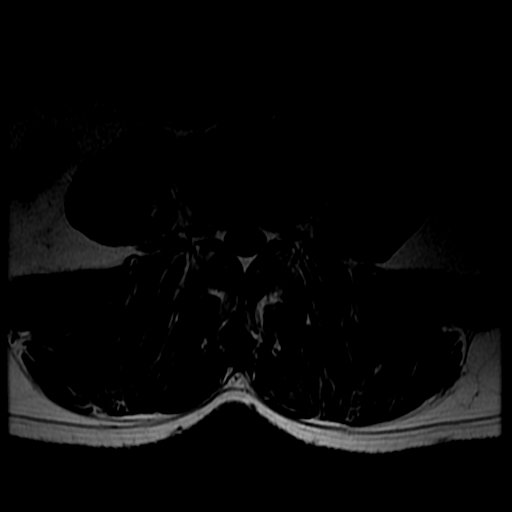
[im 31/35]
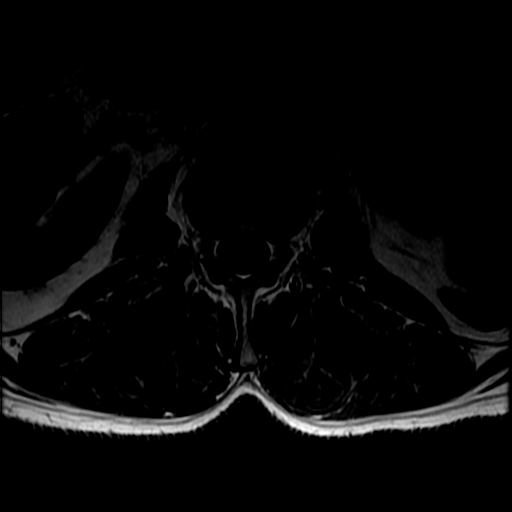

[19 of 48 positions shown; findings below may reference images not displayed]

FINDINGS: Abnormal T2 hyperintense enhancing lesion in the right
side of the sacrum measures 2.8 x 2.1 cm on axial images, and is
highly suspicious for osseous metastatic disease.

Mild degenerative endplate findings noted along the right inferior
endplate of L4. The lowest full intervertebral disk space is
labeled L5-S1.  If procedural intervention is to be performed,
careful correlation with this numbering strategy is recommended.

The conus medullaris appears unremarkable.  Conus level:  L1.
Small L1 vertebral body hemangioma noted. No significant vertebral
subluxation.

Left kidney absent.  Additional findings at individual levels are
as follows:

L1-2:  Unremarkable.

L2-3:  No impingement.  Mild disc bulge.

L3-4:  Borderline bilateral foraminal stenosis and borderline
central stenosis due to disc bulge.

L4-5:  Moderate central stenosis, mild left and borderline right
subarticular lateral recess stenosis, and borderline bilateral
foraminal stenosis secondary to disc bulge, central disc
protrusion, and facet arthropathy.

L5-S1:  Unremarkable.
IMPRESSION: 1. In the context of the patient's clinical history, the 2.8 x
cm right sacral lesion is highly concerning for osseous metastatic
disease.  Stress fracture is a significantly less likely
differential diagnostic consideration.
2.  Lumbar spondylosis and degenerative disc disease, causing
moderate impingement at L4-5.

## 2015-01-12 ENCOUNTER — Telehealth: Payer: Self-pay | Admitting: *Deleted

## 2015-01-12 NOTE — Telephone Encounter (Signed)
Patient wanted update on neurosurgery referral appointment date/time. Informed patient [place] [facility] will contact them after review of information. Patient verbalized understanding.

## 2015-01-24 IMAGING — MR MR HEAD WO/W CM
8 of 11 series · 28 of 48 positions shown · IV contrast (Yes)
Comparison: None.

CLINICAL DATA: Renal cell cancer.  Osseous metastatic disease.
Headaches.

MRI HEAD WITHOUT AND WITH CONTRAST
TECHNIQUE: Multiplanar, multiecho pulse sequences of the brain and
surrounding structures were obtained according to standard protocol
without and with intravenous contrast
Contrast: 20mL MULTIHANCE GADOBENATE DIMEGLUMINE 529 MG/ML IV SOLN

[Series 3: T1 · sagittal · 5.0mm · 0.47mm/px · 1 of 24 slices shown]
[im 1/24]
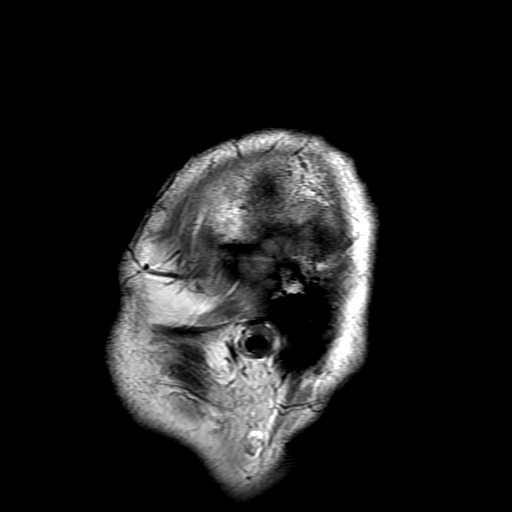

[Series 4: DWI · axial · 5.0mm · 1.09mm/px · z∈[-62,+97]mm · 8 of 66 slices shown (1 of 2)]
[im 1/66]
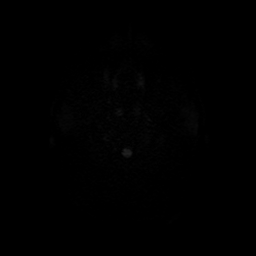
[im 10/66]
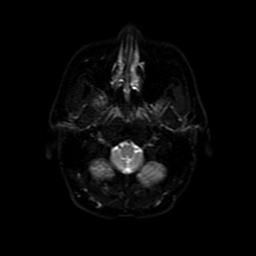
[im 19/66]
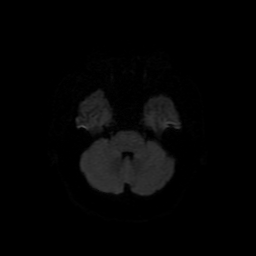
[im 28/66]
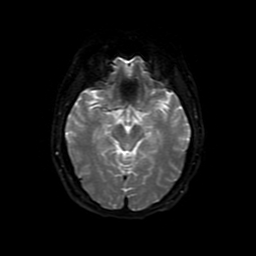
[im 38/66]
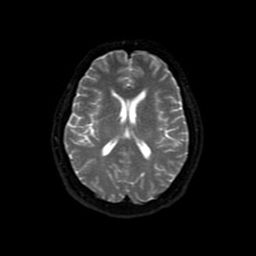
[im 47/66]
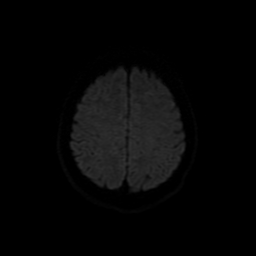
[im 56/66]
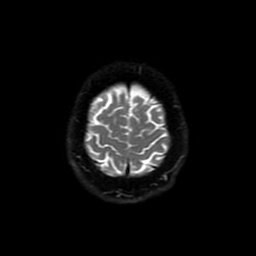
[im 66/66]
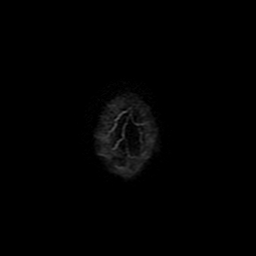

[Series 5: T2 · axial · 5.0mm · 0.43mm/px · z∈[-61,+101]mm · 3 of 26 slices shown]
[im 1/26]
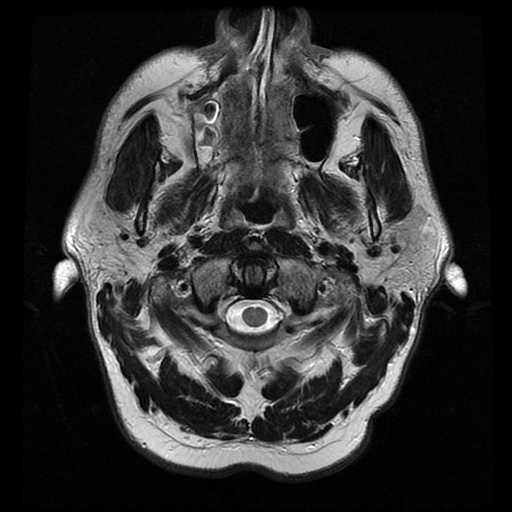
[im 13/26]
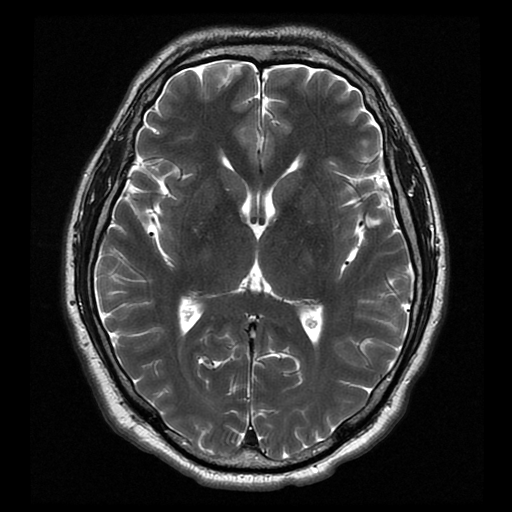
[im 26/26]
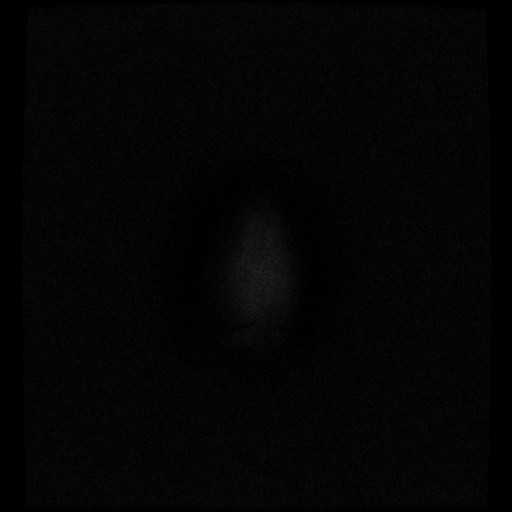

[Series 6: FLAIR · axial · 5.0mm · 0.43mm/px · z∈[-67,+107]mm · 3 of 26 slices shown]
[im 1/26]
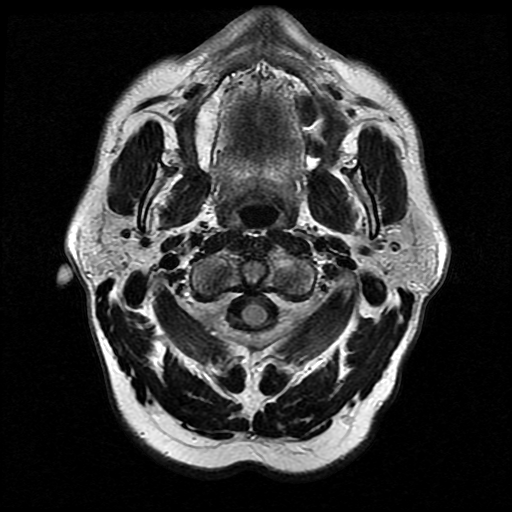
[im 13/26]
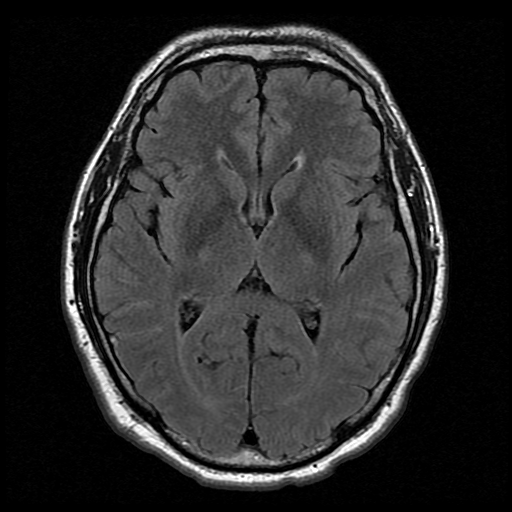
[im 26/26]
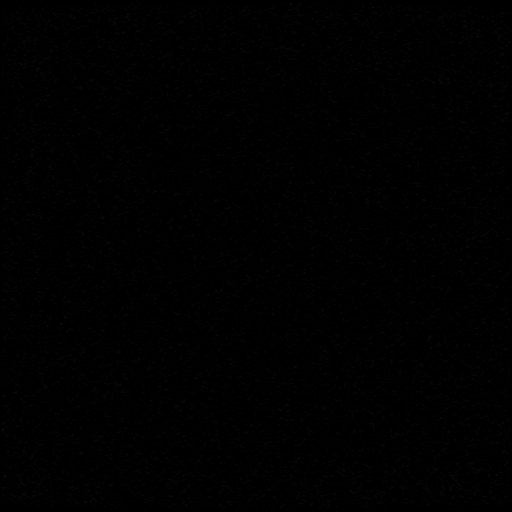

[Series 9: T2 post-contrast · coronal · 5.0mm · 0.45mm/px · 3 of 25 slices shown]
[im 1/25]
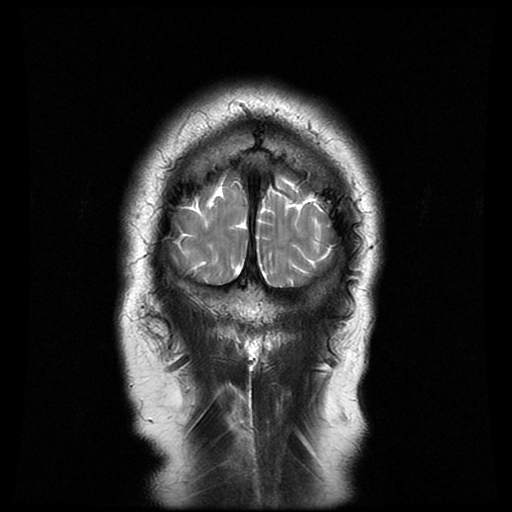
[im 13/25]
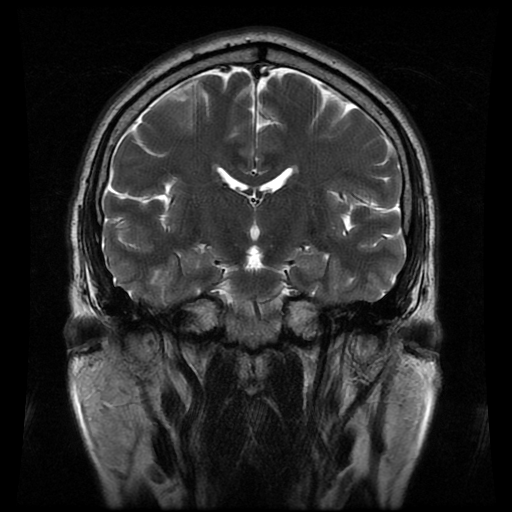
[im 25/25]
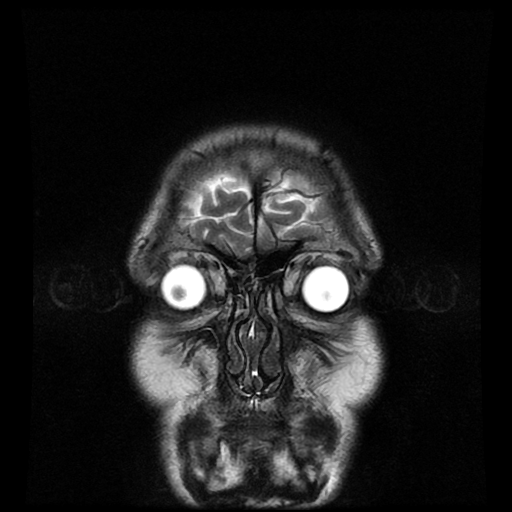

[Series 11: T1 post-contrast · coronal · 5.0mm · 0.45mm/px · 3 of 25 slices shown (1 of 2)]
[im 1/25]
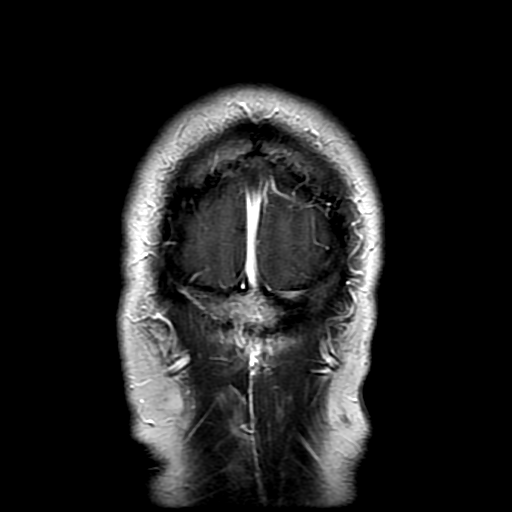
[im 13/25]
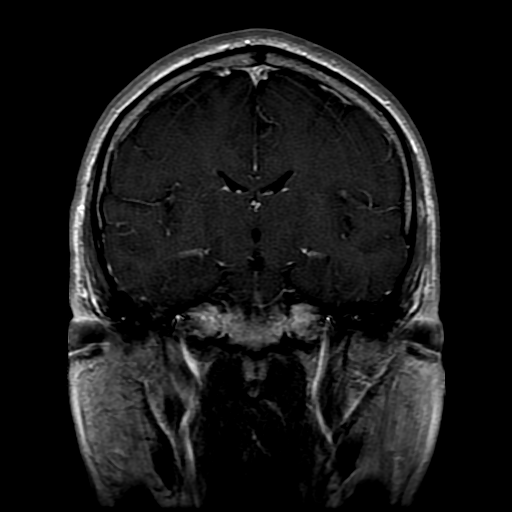
[im 25/25]
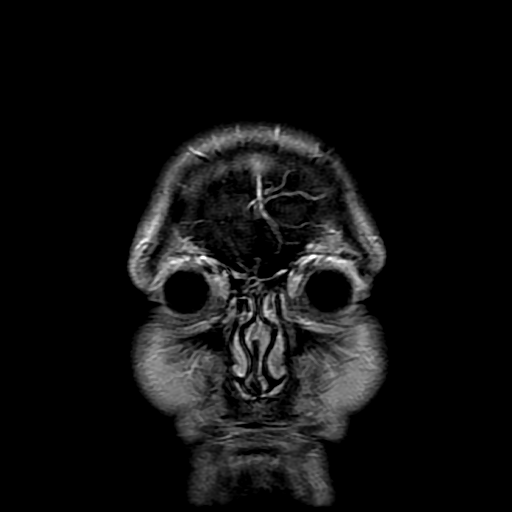

[Series 12: T1 post-contrast · sagittal · 5.0mm · 0.47mm/px · 3 of 24 slices shown (2 of 2)]
[im 1/24]
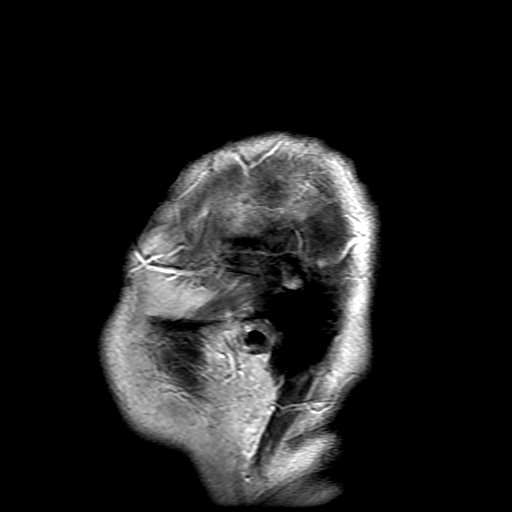
[im 12/24]
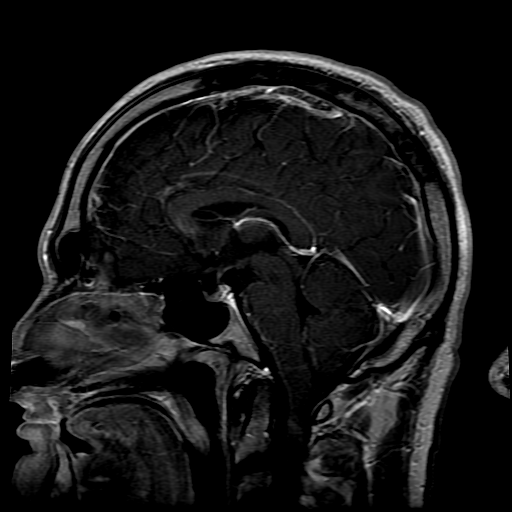
[im 24/24]
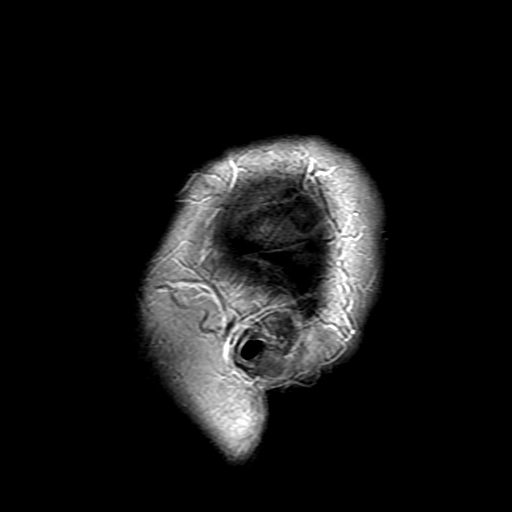

[Series 400: DWI · axial · 5.0mm · 1.09mm/px · z∈[-62,+97]mm · 4 of 33 slices shown (2 of 2)]
[im 1/33]
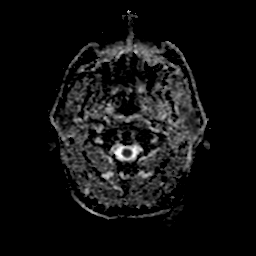
[im 11/33]
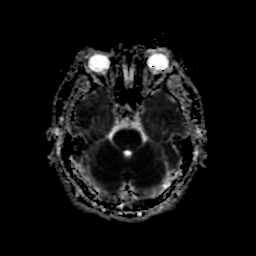
[im 22/33]
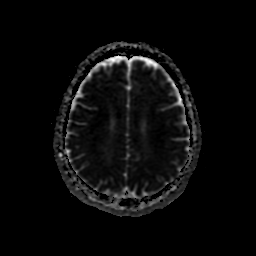
[im 33/33]
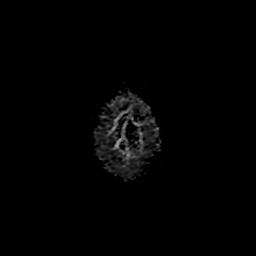

[28 of 48 positions shown; findings below may reference images not displayed]

FINDINGS: No acute stroke or hemorrhage.  No mass lesion or
hydrocephalus.  Post infusion, no abnormal intracranial enhancement
except for a left cerebellar venous angioma. Normal cerebral
volume.  Slight periventricular white matter signal abnormality,
nonspecific.  No skull base, calvarial, or upper cervical osseous
disease.  Major intracranial vascular structures widely patent.  No
midline abnormality.  Negative orbits, sinuses, and mastoids.
IMPRESSION: No evidence for intracranial metastatic disease from renal cell
cancer.  No evidence for calvarial or skull base osseous disease.

## 2015-01-31 ENCOUNTER — Other Ambulatory Visit (HOSPITAL_BASED_OUTPATIENT_CLINIC_OR_DEPARTMENT_OTHER): Payer: 59

## 2015-01-31 ENCOUNTER — Ambulatory Visit (HOSPITAL_BASED_OUTPATIENT_CLINIC_OR_DEPARTMENT_OTHER): Payer: 59 | Admitting: Physician Assistant

## 2015-01-31 ENCOUNTER — Encounter: Payer: Self-pay | Admitting: Physician Assistant

## 2015-01-31 ENCOUNTER — Telehealth: Payer: Self-pay | Admitting: Oncology

## 2015-01-31 VITALS — BP 123/93 | HR 85 | Temp 98.2°F | Resp 18 | Ht 76.0 in | Wt 200.1 lb

## 2015-01-31 DIAGNOSIS — M899 Disorder of bone, unspecified: Secondary | ICD-10-CM

## 2015-01-31 DIAGNOSIS — C649 Malignant neoplasm of unspecified kidney, except renal pelvis: Secondary | ICD-10-CM

## 2015-01-31 DIAGNOSIS — C642 Malignant neoplasm of left kidney, except renal pelvis: Secondary | ICD-10-CM

## 2015-01-31 DIAGNOSIS — M792 Neuralgia and neuritis, unspecified: Secondary | ICD-10-CM

## 2015-01-31 DIAGNOSIS — C7889 Secondary malignant neoplasm of other digestive organs: Secondary | ICD-10-CM

## 2015-01-31 DIAGNOSIS — C7801 Secondary malignant neoplasm of right lung: Secondary | ICD-10-CM

## 2015-01-31 DIAGNOSIS — F418 Other specified anxiety disorders: Secondary | ICD-10-CM

## 2015-01-31 LAB — CBC WITH DIFFERENTIAL/PLATELET
BASO%: 0.9 % (ref 0.0–2.0)
Basophils Absolute: 0.1 10*3/uL (ref 0.0–0.1)
EOS%: 4.1 % (ref 0.0–7.0)
Eosinophils Absolute: 0.3 10*3/uL (ref 0.0–0.5)
HCT: 47.3 % (ref 38.4–49.9)
HGB: 15.6 g/dL (ref 13.0–17.1)
LYMPH%: 25.1 % (ref 14.0–49.0)
MCH: 34.6 pg — ABNORMAL HIGH (ref 27.2–33.4)
MCHC: 33 g/dL (ref 32.0–36.0)
MCV: 104.8 fL — ABNORMAL HIGH (ref 79.3–98.0)
MONO#: 0.6 10*3/uL (ref 0.1–0.9)
MONO%: 8 % (ref 0.0–14.0)
NEUT#: 4.9 10*3/uL (ref 1.5–6.5)
NEUT%: 61.9 % (ref 39.0–75.0)
Platelets: 253 10*3/uL (ref 140–400)
RBC: 4.51 10*6/uL (ref 4.20–5.82)
RDW: 15.1 % — ABNORMAL HIGH (ref 11.0–14.6)
WBC: 7.9 10*3/uL (ref 4.0–10.3)
lymph#: 2 10*3/uL (ref 0.9–3.3)

## 2015-01-31 LAB — COMPREHENSIVE METABOLIC PANEL (CC13)
ALT: 46 U/L (ref 0–55)
AST: 26 U/L (ref 5–34)
Albumin: 3.4 g/dL — ABNORMAL LOW (ref 3.5–5.0)
Alkaline Phosphatase: 115 U/L (ref 40–150)
Anion Gap: 10 mEq/L (ref 3–11)
BUN: 8.9 mg/dL (ref 7.0–26.0)
CO2: 30 mEq/L — ABNORMAL HIGH (ref 22–29)
Calcium: 9.8 mg/dL (ref 8.4–10.4)
Chloride: 99 mEq/L (ref 98–109)
Creatinine: 1.2 mg/dL (ref 0.7–1.3)
EGFR: 73 mL/min/{1.73_m2} — ABNORMAL LOW (ref >90)
Glucose: 82 mg/dl (ref 70–140)
Potassium: 4.3 mEq/L (ref 3.5–5.1)
Sodium: 140 mEq/L (ref 136–145)
Total Bilirubin: 0.45 mg/dL (ref 0.20–1.20)
Total Protein: 7.7 g/dL (ref 6.4–8.3)

## 2015-01-31 MED ORDER — ALPRAZOLAM 1 MG PO TABS: ORAL_TABLET | ORAL | Status: DC

## 2015-01-31 MED ORDER — HYDROMORPHONE HCL 4 MG PO TABS: 4.0000 mg | ORAL_TABLET | ORAL | Status: DC | PRN

## 2015-01-31 NOTE — Telephone Encounter (Signed)
gv and rpinted appt sched adn avs for pt for March

## 2015-01-31 NOTE — Progress Notes (Signed)
Hematology and Oncology Follow Up Visit  Keith [name] [id number] 10-13-1966 49 y.o. 01/31/2015 4:26 PM  [name], MD  [name], M.D.  [name], MD  [name], MD    Principle Diagnosis: This is a 49 year old gentleman with stage IV renal cell carcinoma diagnosed in 2009.  Prior Therapy: 1. Status post laparoscopic radical nephrectomy.  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  2. Patient status post thoracotomy for a synchronous metastatic lung lesions done October 2009.  He had a lower lobe nodule, biopsy proven to be metastatic renal cell carcinoma.   3. Patient is status post stereotactic radiotherapy to pulmonary nodules in May of 2010. 4. He is S/P Sutent 50 mg 4 weeks on 2 weeks off from 10/2010 to 03/2013. He progressed at that time.  5. He is S/P radiation to the right sacral bone between 4/22 to 4/30.  6. He is S/P XRT to the left shoulder 03/20/14 to 03/31/14.   Current therapy: He is on Votrient 800 mg since 03/2013. His disease includes lung lesions, pancreatic as well as right sacral lesion.   Interim History:  Keith [name] presents today for a followup visit. Since his last visit, he is reporting increased pain in his right hip and pelvic area. He continues on the gabapentin 300 mg by mouth 3 times daily and is scheduled to see a neurologist on 02/21/2015. His pain is predominantly neuropathic originating from the right groin and the right hip area and shooting down his leg. He did not report any new complications or illnesses from Votrient.  He has not reported any nausea or vomiting. He is not reporting any constipation or diarrhea. He is not reporting any neurological symptoms. Has not reported any recent hospitalizations or illnesses. He did not report any headaches blurred vision or double vision. Does not report any seizure activity or alteration of mental status. Is not reporting any chest pain or difficulty breathing. Is  not reporting any cough or hemoptysis. Does not report any abdominal pain or hematochezia. Does not report any hematuria but does report occasional hesitancy and nocturia. He does not report any rashes or lesions or petechiae. He does not report any lymphadenopathy. His performance status and activity level remain stable at this time. Remainder of his review of system is unremarkable.  Medications: I have reviewed the patient's current medications.  Current Outpatient Prescriptions  Medication Sig Dispense Refill  . ALPRAZolam (XANAX) 1 MG tablet TAKE 1 TABLET EVERY 8 HOURS AS NEEDED FOR ANXIETY 100 tablet 0  . DULoxetine (CYMBALTA) 60 MG capsule TAKE 1 CAPSULE TWICE DAILY 60 capsule 2  . gabapentin (NEURONTIN) 300 MG capsule Take 1 capsule (300 mg total) by mouth 3 (three) times daily. 90 capsule 3  . HYDROmorphone (DILAUDID) 4 MG tablet Take 1 tablet (4 mg total) by mouth every 4 (four) hours as needed for severe pain. 30 tablet 0  . ibuprofen (ADVIL,MOTRIN) 800 MG tablet Take 800 mg by mouth every 8 (eight) hours as needed.    . pazopanib (VOTRIENT) 200 MG tablet Take 4 tablets (800 mg total) by mouth every morning. Take on an empty stomach. 120 tablet 0  . VIAGRA 100 MG tablet Take 50 mg by mouth as needed for erectile dysfunction.      No current facility-administered medications for this visit.     Allergies:  Allergies  Allergen Reactions  . Ceftriaxone Hives  . Hydrocodone Swelling    Past Medical  History, Surgical history, Social history, and Family History were reviewed and updated.   Physical Exam: Blood pressure 123/93, pulse 85, temperature 98.2 F (36.8 C), temperature source Oral, resp. rate 18, height 6\' 4"  (1.93 m), weight 200 lb 1.6 oz (90.765 kg), SpO2 100 %. ECOG: 1 General appearance: alert and awake chronically ill-appearing.  Head: Normocephalic, without obvious abnormality.  Neck: no adenopathy, no masses.  Lymph nodes: Cervical, supraclavicular, and axillary  nodes normal. Heart:regular rate and rhythm, S1, S2.  Lung:chest clear, no wheezing, rales, normal symmetric air entry. No dullness to percussion.  Abdomen: soft, non-tender, without masses or organomegaly EXT:no edema, no despumation. No erythema noted.   Neuro: no focal deficits noted. No weakness noted in his lower extremities.  Lab Results: Lab Results  Component Value Date   WBC 7.9 01/31/2015   HGB 15.6 01/31/2015   HCT 47.3 01/31/2015   MCV 104.8* 01/31/2015   PLT 253 01/31/2015    EXAM: CT CHEST AND PELVIS WITH CONTRAST  CT ABDOMEN WITH AND WITHOUT CONTRAST  TECHNIQUE: Multidetector CT imaging of the chest and pelvis was performed during intravenous contrast administration. Multidetector CT imaging of the abdomen was performed following the standard protocol before and during bolus administration of intravenous contrast.  CONTRAST: 177mL OMNIPAQUE IOHEXOL 300 MG/ML SOLN  COMPARISON: 09/19/2014.  FINDINGS: CT CHEST FINDINGS  No pathologically enlarged mediastinal lymph nodes. There may be a 9 mm left hilar lymph node. No axillary adenopathy. Heart is at the upper limits of normal in size. No pericardial effusion.  Centrilobular and paraseptal emphysema with bullous changes at the apex of the right hemi thorax. Postoperative changes of right upper lobectomy. Nodular and linear soft tissue density in the upper right hemi thorax is unchanged and presumably related to radiation therapy. A nodular lesion in the central right middle lobe measures approximately 1.4 x 2.2 cm (series 8, image 60), stable. Scattered scarring and volume loss in the right hemi thorax, as before. 4 mm subpleural right middle lobe nodule (image 48, series 8), unchanged. Left lung is clear. No pleural fluid. Adherent debris is seen along the lower trachea and right mainstem bronchus.  CT ABDOMEN AND PELVIS FINDINGS  Hepatobiliary: Liver and gallbladder are unremarkable. No  biliary ductal dilatation.  Pancreas: Pancreatic neck mass measures 1.7 x 2.3 cm, stable when remeasured on the prior exam. Pancreatic body mass is somewhat difficult to measure but appears roughly stable, measuring 1.9 x 2.7 cm (series 6, image 99). Associated ductal dilatation, measuring up to 1 cm.  Spleen: Negative.  Adrenals/Urinary Tract: Adrenal glands are unremarkable. No urinary stones. Right kidney is unremarkable. Right ureter is decompressed. Left nephrectomy. Bladder is grossly unremarkable.  Stomach/Bowel: Stomach, small bowel, appendix and colon are unremarkable.  Vascular/Lymphatic: Atherosclerotic calcification of the arterial vasculature without abdominal aortic aneurysm. No vascular encasement. No pathologically enlarged lymph nodes.  Reproductive: Prostate is normal in size.  Other: No free fluid. Mesenteries and peritoneum are unremarkable.  Musculoskeletal: A lucent lesion in the right sacrum measures 2.3 x 3.0 cm, stable.  IMPRESSION: 1. Postoperative and postradiation changes in the right hemi thorax, stable, without evidence of disease progression. 2. Stable pancreatic masses. 3. Lytic right sacral lesion, stable.  Impression and Plan:  This is a pleasant 49 year old gentleman with the following issues. 1. Metastatic renal cell carcinoma.  He has documented disease to the lung and the pancreas.  He tolerating Votrient without complications. CT scan from 12/2014 0 reviewed today and showed mostly stable disease. The plan  is to continue with the current dose and schedule. 2. Pancreatic thickening/mass.  This has been biopsy proven to be a renal cell metastasis. No change in treatment at this time.  3. Depression/Anxiety: He remains on Xanax and Cymbalta. His blood appears to be stable. He was given a refill prescription for Xanax tablets today. 4. Weight loss: This seems to be improved at this time. 5. Sacral mass: S/P radiation. Reports  slight pain in that area. I have increased his gabapentin to 300 mg 3 times a day I also referred him to neurosurgery for an evaluation. I'm hoping for possible intervention to relieve some of his pain in the hip area. 6. Neuropathic pain. He is on gabapentin and Dilaudid for pain. He was given a refill for the allotted tablets today. Oxycodone was discontinued due to lack of effectiveness. 7. Follow up: One month.  Wynetta Emery, Chenika Nevils E, PA-C

## 2015-01-31 NOTE — Patient Instructions (Signed)
Continue Votrient at the current dose Keep your scheduled appointment with a neurologist Follow-up in one month

## 2015-02-01 ENCOUNTER — Other Ambulatory Visit: Payer: Self-pay | Admitting: *Deleted

## 2015-02-01 DIAGNOSIS — C649 Malignant neoplasm of unspecified kidney, except renal pelvis: Secondary | ICD-10-CM

## 2015-02-01 MED ORDER — PAZOPANIB HCL 200 MG PO TABS: 800.0000 mg | ORAL_TABLET | Freq: Every morning | ORAL | Status: DC

## 2015-02-05 ENCOUNTER — Encounter: Payer: Self-pay | Admitting: Oncology

## 2015-02-05 NOTE — Progress Notes (Signed)
Faxed cymbalta pa form to [facility]

## 2015-02-05 NOTE — Progress Notes (Signed)
[facility] approved duloxetine from 02/05/15-02/06/16

## 2015-02-07 IMAGING — MR MR THORACIC SPINE WO/W CM
4 of 8 series · 19 of 48 positions shown · IV contrast (multihance)
Comparison: As, abdomen and pelvis 03/22/2013.

CLINICAL DATA: History of renal cell carcinoma with metastatic
disease to bone.

MRI THORACIC SPINE WITHOUT AND WITH CONTRAST
TECHNIQUE: Multiplanar and multiecho pulse sequences of the
thoracic spine were obtained without and with intravenous contrast.
Contrast: 20mL MULTIHANCE GADOBENATE DIMEGLUMINE 529 MG/ML IV SOLN

[Series 6: T1 · sagittal · 3.0mm · 0.66mm/px · 3 of 14 slices shown (1 of 2)]
[im 1/14]
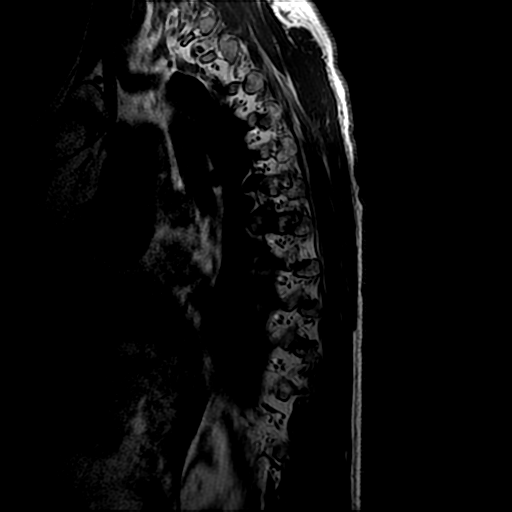
[im 7/14]
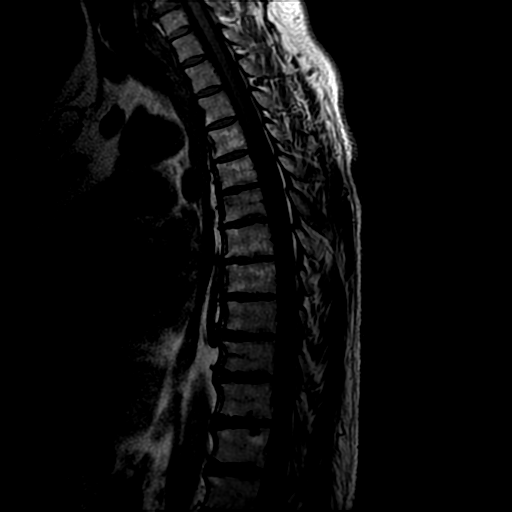
[im 14/14]
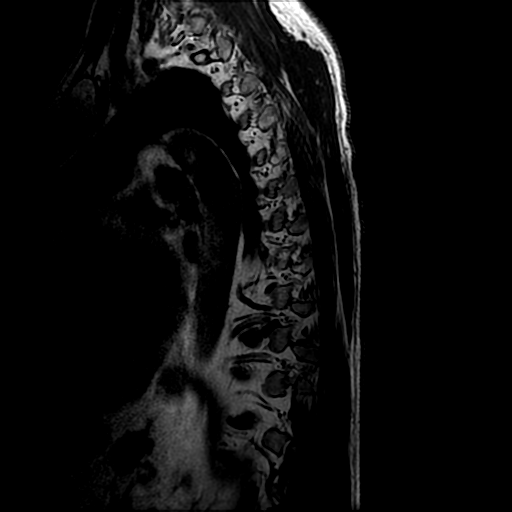

[Series 7: T2 · sagittal · 3.0mm · 0.66mm/px · 3 of 14 slices shown (1 of 2)]
[im 1/14]
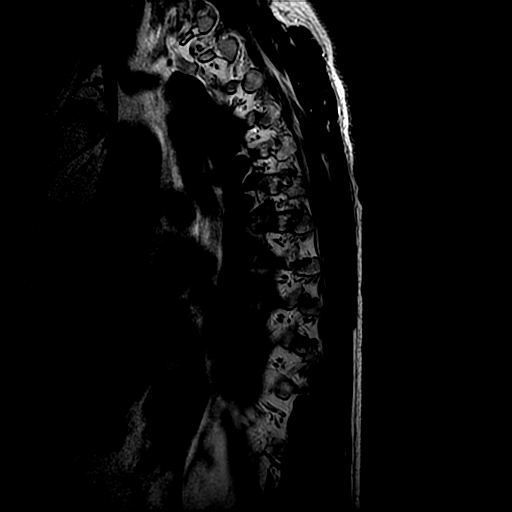
[im 7/14]
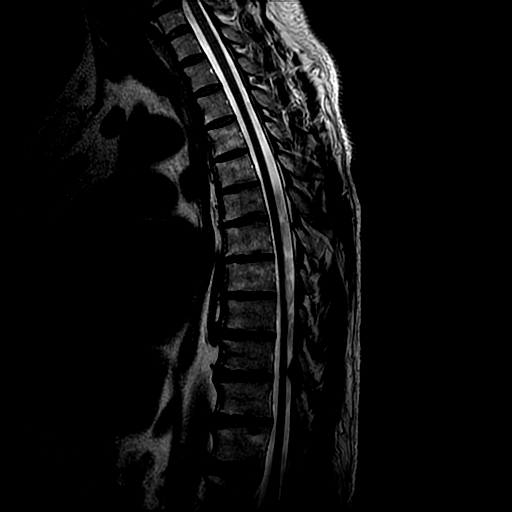
[im 14/14]
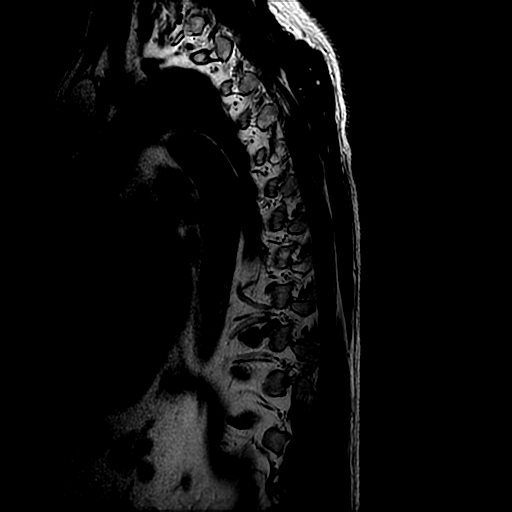

[Series 9: T2 · axial · 4.0mm · 0.39mm/px · z∈[-341,-100]mm · 10 of 46 slices shown (2 of 2)]
[im 1/46]
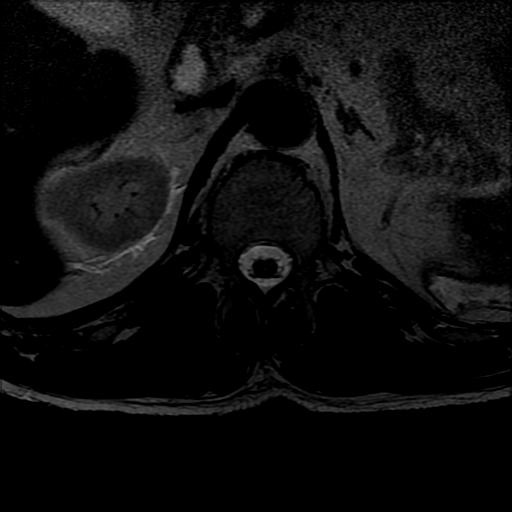
[im 5/46]
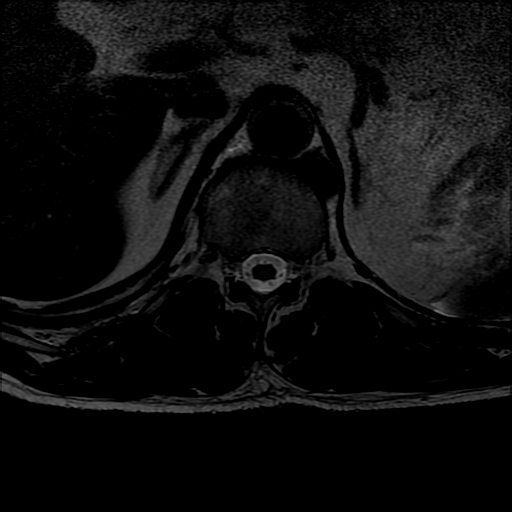
[im 10/46]
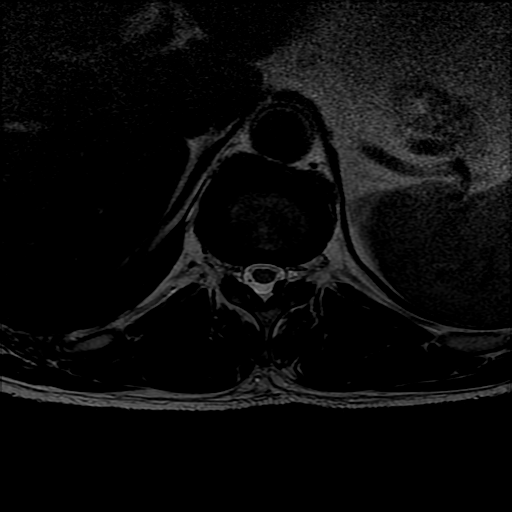
[im 14/46]
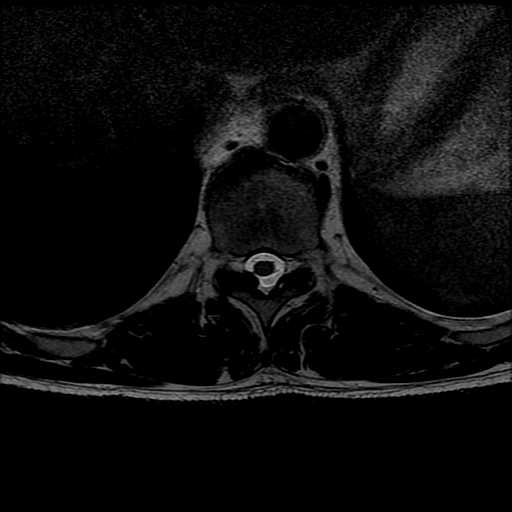
[im 19/46]
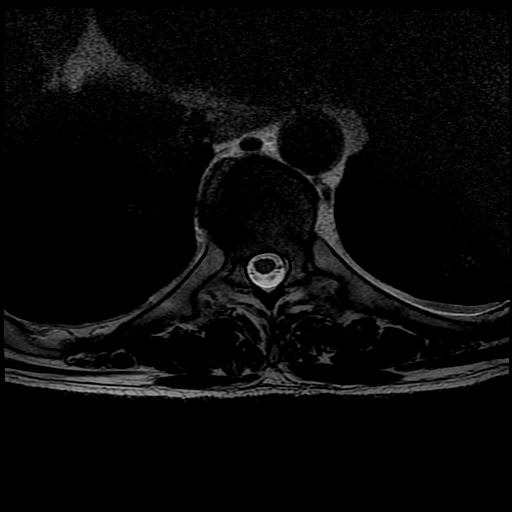
[im 23/46]
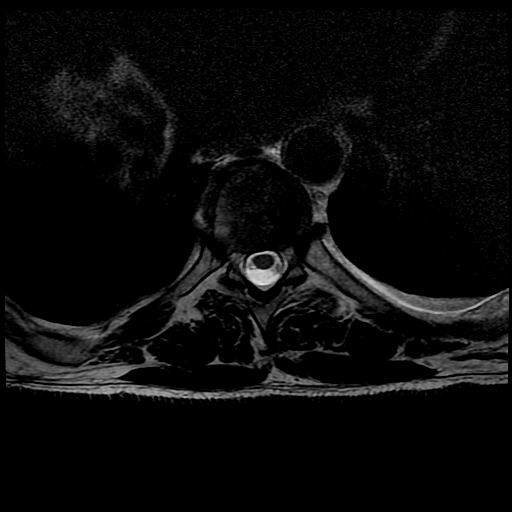
[im 28/46]
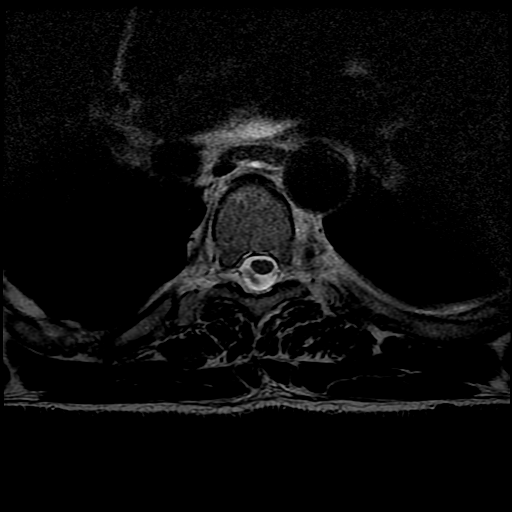
[im 32/46]
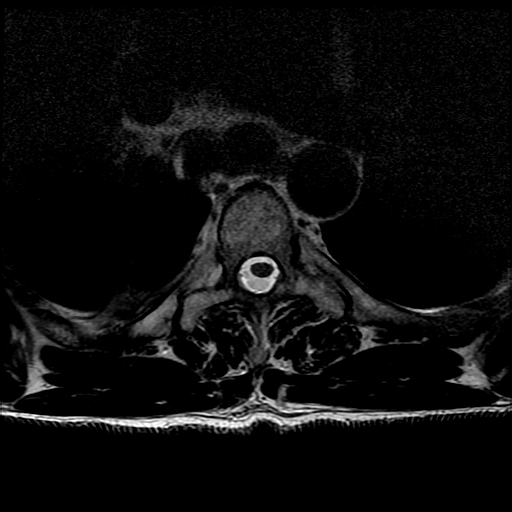
[im 37/46]
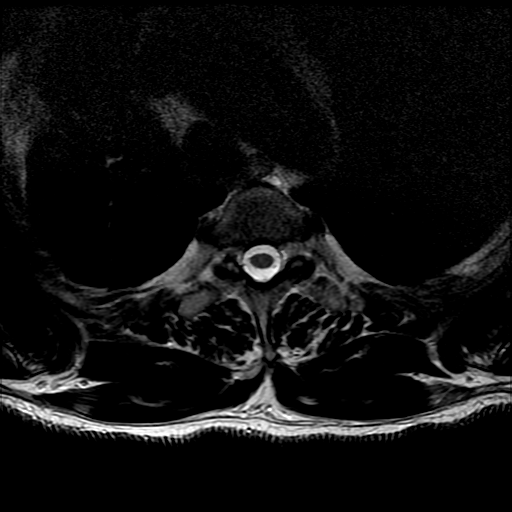
[im 41/46]
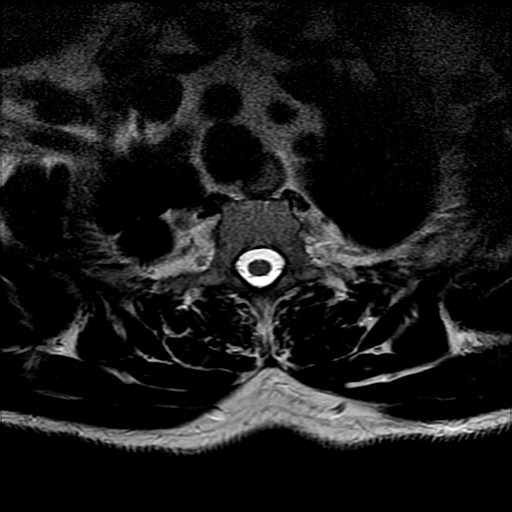

[Series 10: T1 · axial · non-contrast · 4.0mm · 0.39mm/px · z∈[-321,-100]mm · 3 of 46 slices shown (2 of 2)]
[im 5/46]
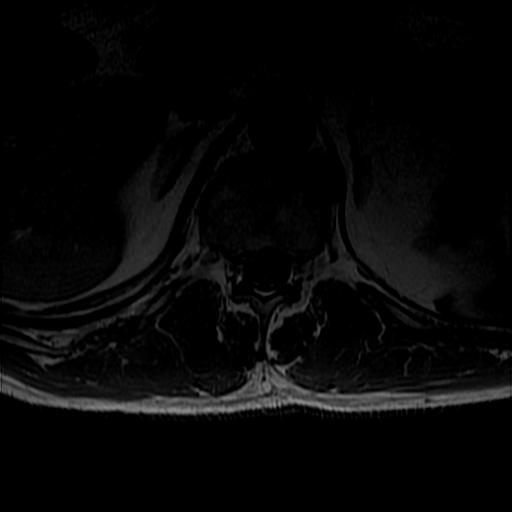
[im 23/46]
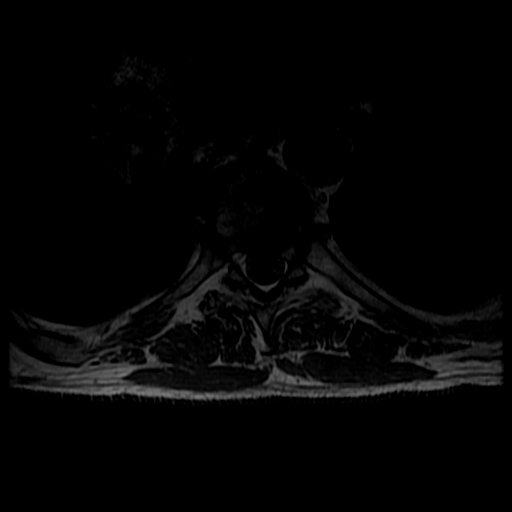
[im 41/46]
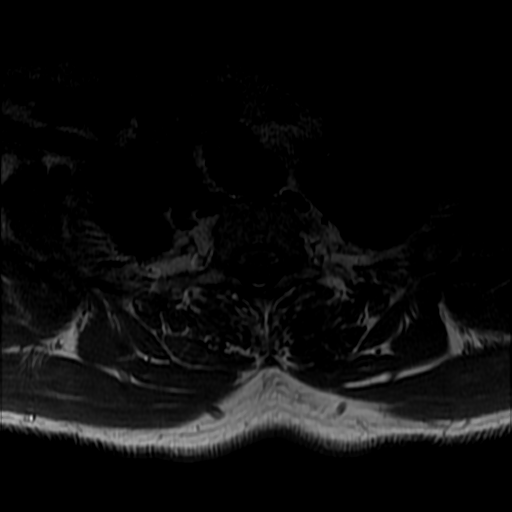

[19 of 48 positions shown; findings below may reference images not displayed]

FINDINGS: There is multilevel degenerative endplate signal change
in the upper and mid thoracic spine.  Small focus of decreased T1
signal with minimal post contrast enhancement in the superior plate
of T6 is likely degenerative.  No definite metastatic deposit is
seen.  The thoracic cord demonstrates normal signal throughout.  No
epidural tumor is seen.  The central spinal canal is widely patent
at all levels.  Remote fracture of the posterior arcs of the right
fourth and seventh ribs are noted.
IMPRESSION: Negative for metastatic disease of the thoracic spine. Small focus
of edema and mild enhancement in the anterior, superior endplate of
T6 is most consistent with degenerative disease.  Post-treatment
change in the right upper lobe is noted as described on the
comparison CT scan.

## 2015-02-20 ENCOUNTER — Other Ambulatory Visit: Payer: Self-pay | Admitting: *Deleted

## 2015-02-20 DIAGNOSIS — C649 Malignant neoplasm of unspecified kidney, except renal pelvis: Secondary | ICD-10-CM

## 2015-02-20 MED ORDER — PAZOPANIB HCL 200 MG PO TABS: 800.0000 mg | ORAL_TABLET | Freq: Every morning | ORAL | Status: DC

## 2015-02-21 DIAGNOSIS — M5417 Radiculopathy, lumbosacral region: Secondary | ICD-10-CM | POA: Insufficient documentation

## 2015-02-26 ENCOUNTER — Other Ambulatory Visit: Payer: Self-pay | Admitting: *Deleted

## 2015-02-26 DIAGNOSIS — C649 Malignant neoplasm of unspecified kidney, except renal pelvis: Secondary | ICD-10-CM

## 2015-02-26 IMAGING — CT CT HEAD W/O CM
2 series · 17 of 30 positions shown, 20 images · non-contrast
Comparison: 10/12/2012

CLINICAL DATA: Seizure

CT HEAD WITHOUT CONTRAST
TECHNIQUE: Contiguous axial images were obtained from the base of
the skull through the vertex without contrast.

[Series 2: head w/o · axial · non-contrast · 0.43mm/px · z∈[-148,-28]mm · 9 of 30 slices shown, 12 images]
[im 3/30  brain]
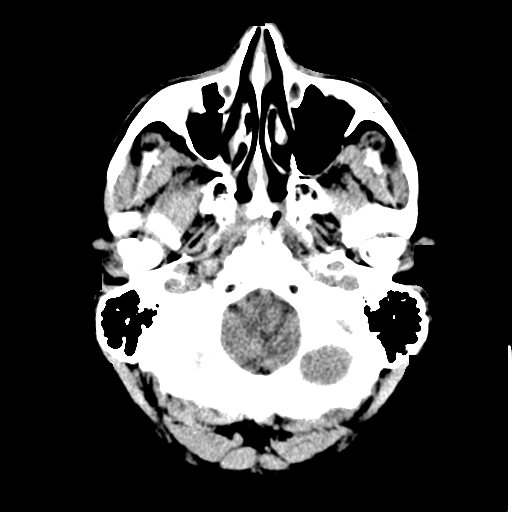
[im 3/30  bone]
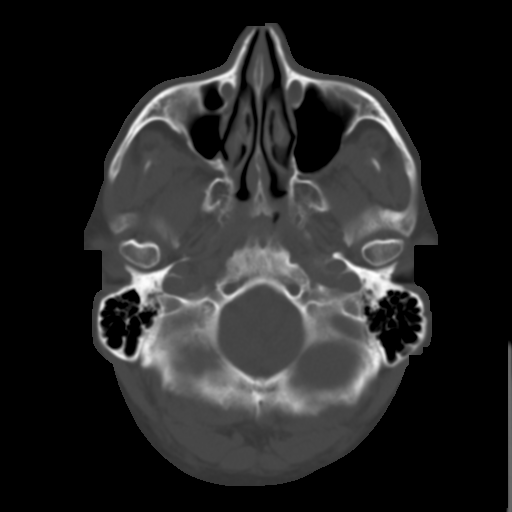
[im 6/30  brain]
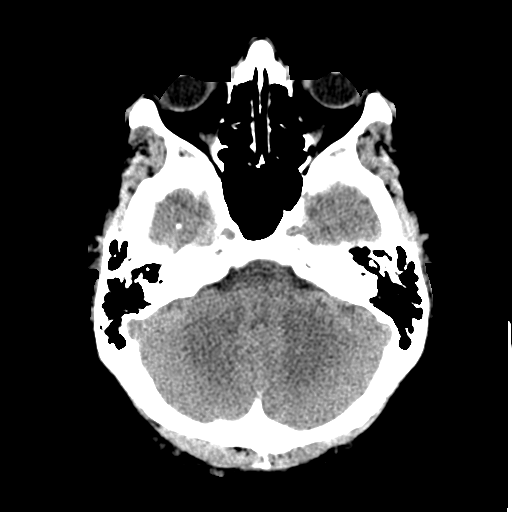
[im 9/30  brain]
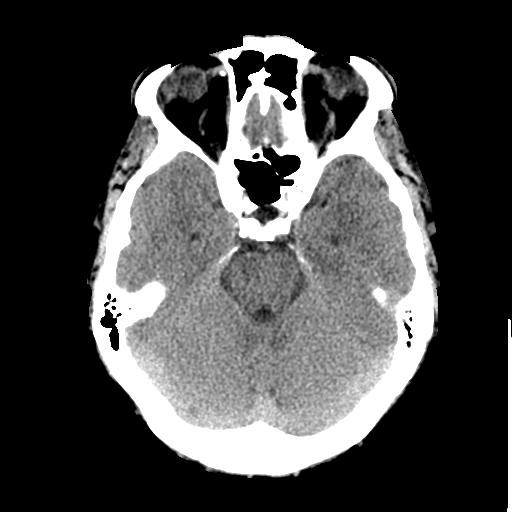
[im 12/30  brain]
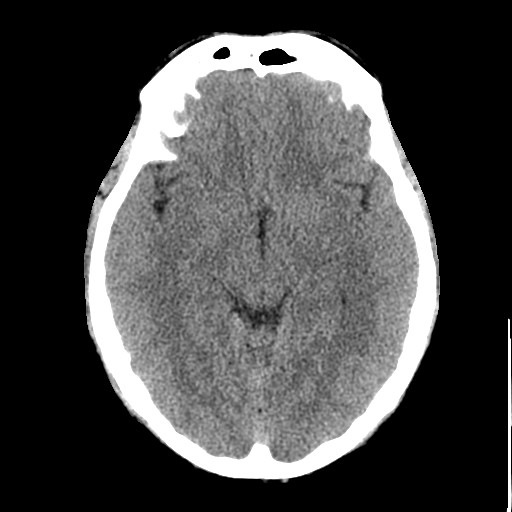
[im 15/30  brain]
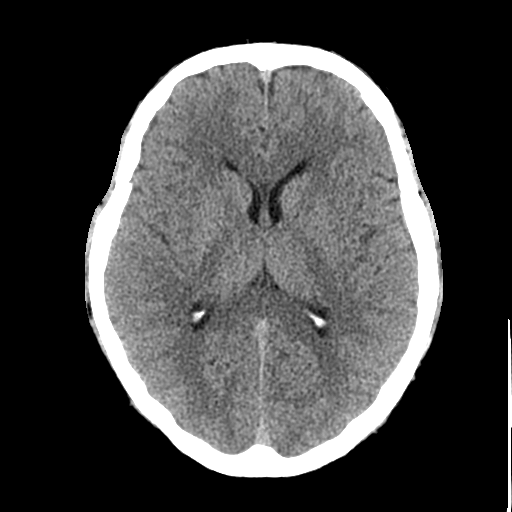
[im 15/30  bone]
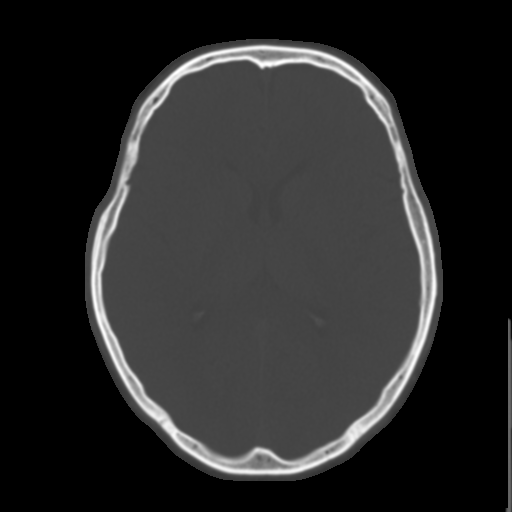
[im 18/30  brain]
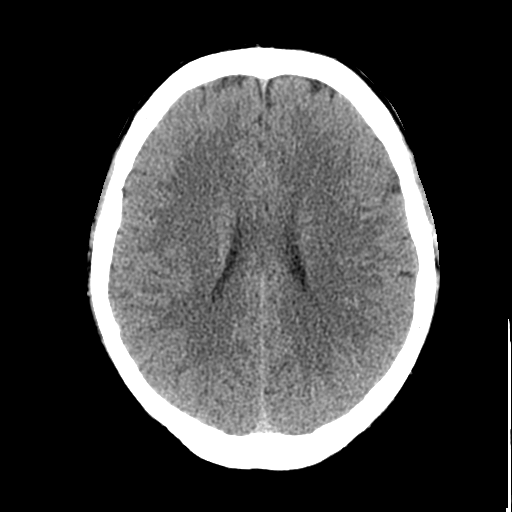
[im 21/30  brain]
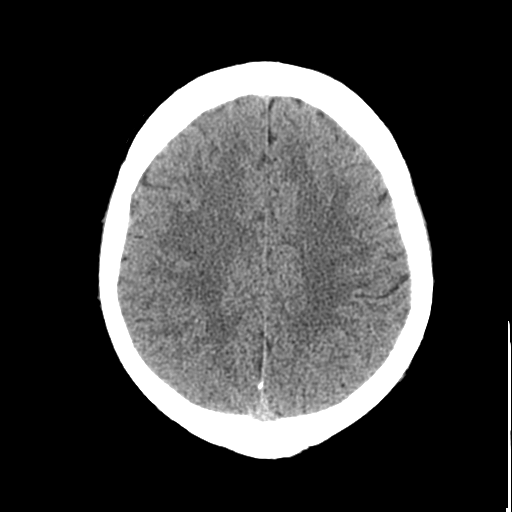
[im 24/30  brain]
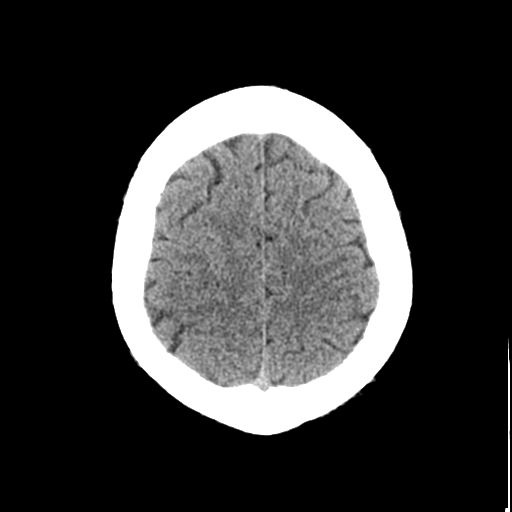
[im 27/30  brain]
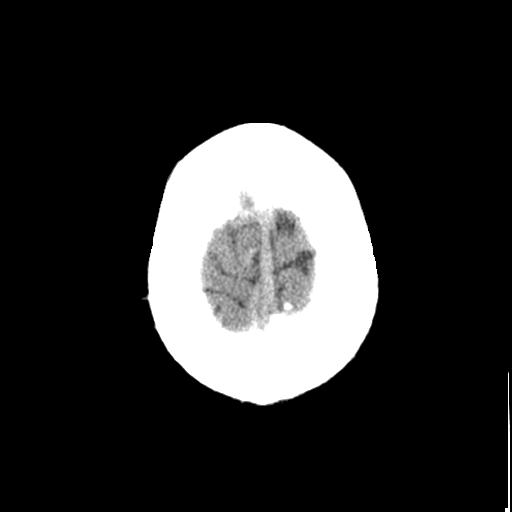
[im 27/30  bone]
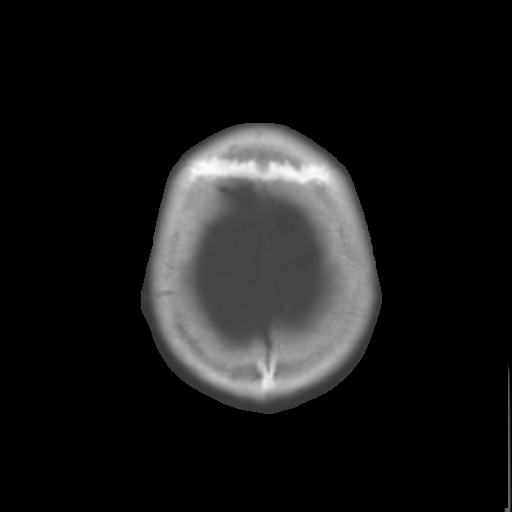

[Series 3: bone windows · axial · 0.43mm/px · z∈[-143,-29]mm · 8 of 50 slices shown]
[im 6/50  bone]
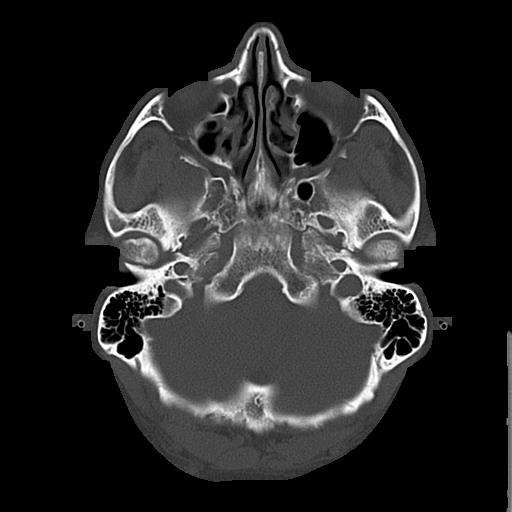
[im 11/50  bone]
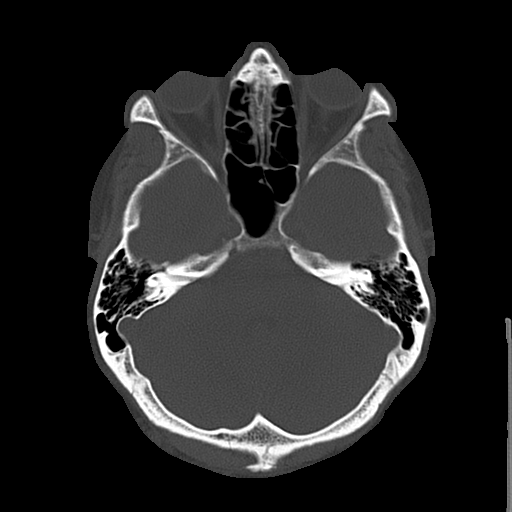
[im 17/50  bone]
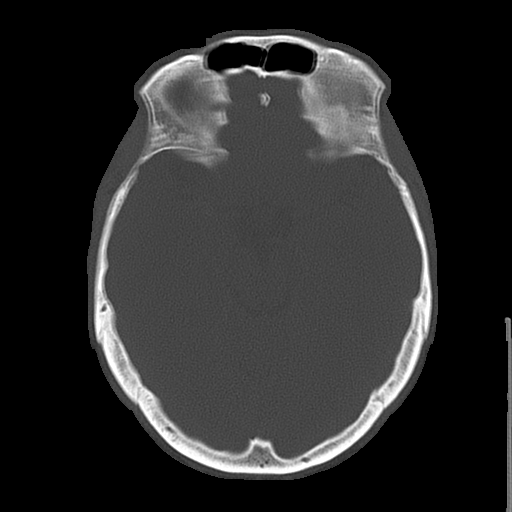
[im 22/50  bone]
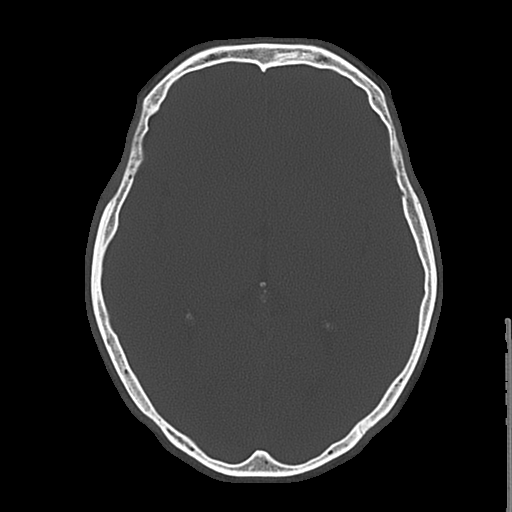
[im 28/50  bone]
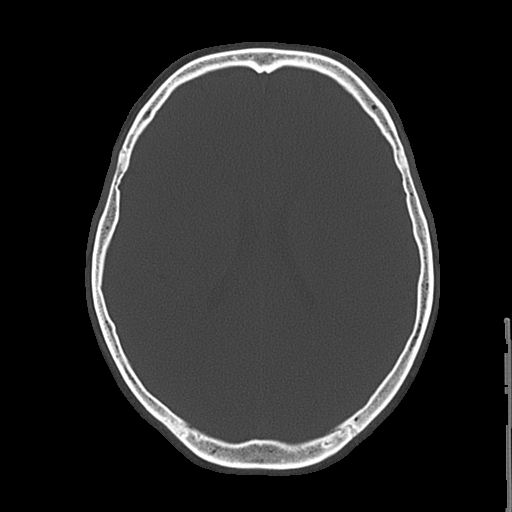
[im 33/50  bone]
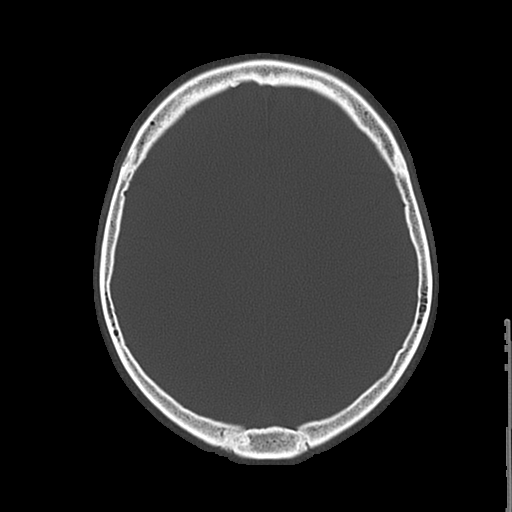
[im 39/50  bone]
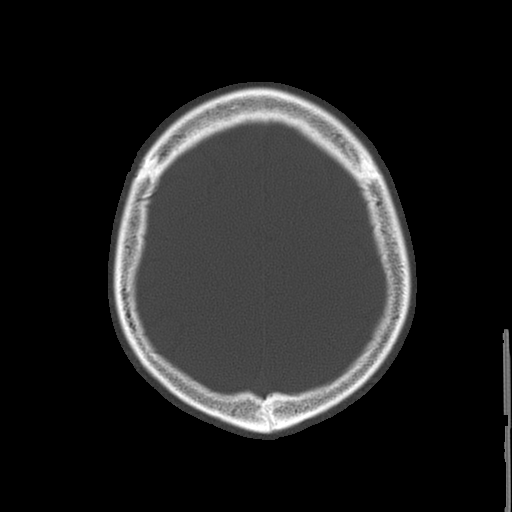
[im 44/50  bone]
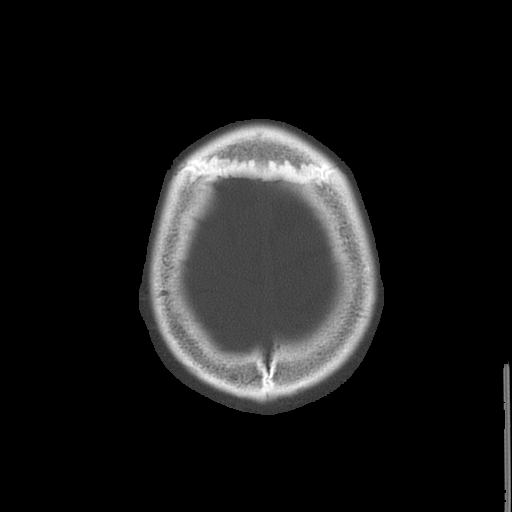

[17 of 30 positions shown; findings below may reference images not displayed]

FINDINGS: No skull fracture is noted.  Paranasal sinuses and
mastoid air cells are unremarkable.  No intracranial hemorrhage,
mass effect or midline shift.

No acute infarction.  No mass lesion is noted on this unenhanced
scan.  Ventricular size is stable from prior exam.  No intra or
extra-axial fluid collection.
IMPRESSION: No acute intracranial abnormality.

## 2015-02-26 IMAGING — MR MR HEAD WO/W CM
8 of 12 series · 27 of 48 positions shown · IV contrast (multihance)
Comparison: 04/24/2013 head CT.  03/23/2011 brain MR.

CLINICAL DATA: History of lung cancer.  Awoke vomiting with
intermittent headache.  Rule out new mass/metastatic disease.

MRI HEAD WITHOUT AND WITH CONTRAST
TECHNIQUE: Multiplanar, multiecho pulse sequences of the brain and
surrounding structures were obtained according to standard protocol
without and with intravenous contrast
Contrast: 20mL MULTIHANCE GADOBENATE DIMEGLUMINE 529 MG/ML IV SOLN

[Series 3: DWI · axial · 5.0mm · 1.09mm/px · z∈[-82,+63]mm · 7 of 60 slices shown (1 of 2)]
[im 1/60]
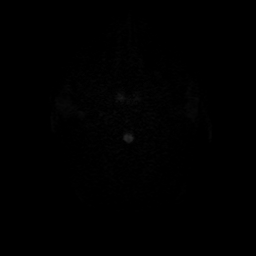
[im 10/60]
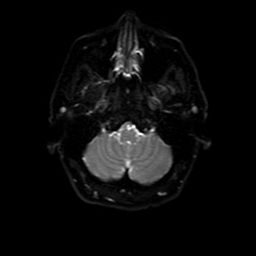
[im 20/60]
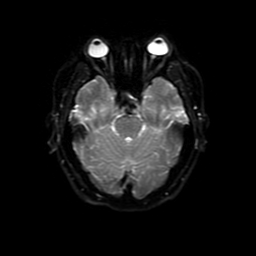
[im 30/60]
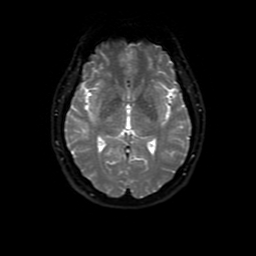
[im 40/60]
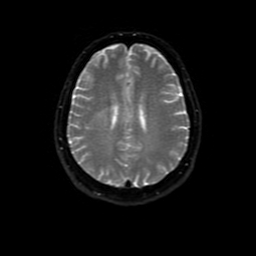
[im 50/60]
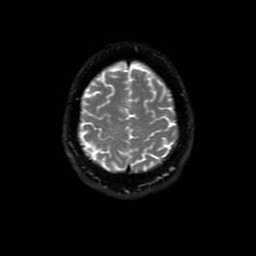
[im 60/60]
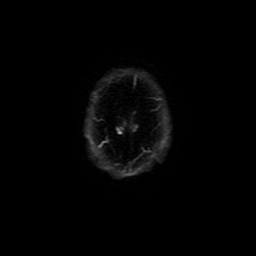

[Series 5: T2 · axial · 5.0mm · 0.43mm/px · z∈[-85,+65]mm · 3 of 24 slices shown]
[im 1/24]
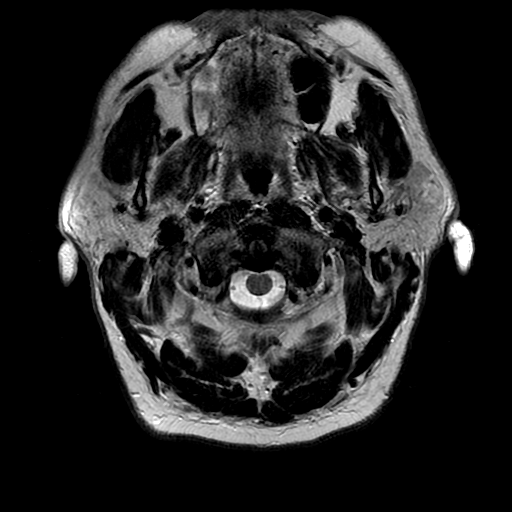
[im 12/24]
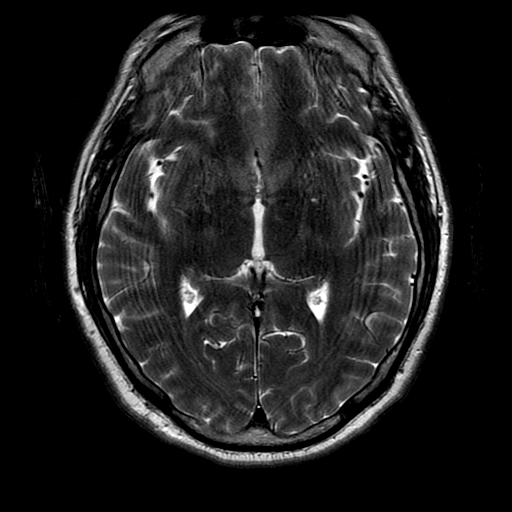
[im 24/24]
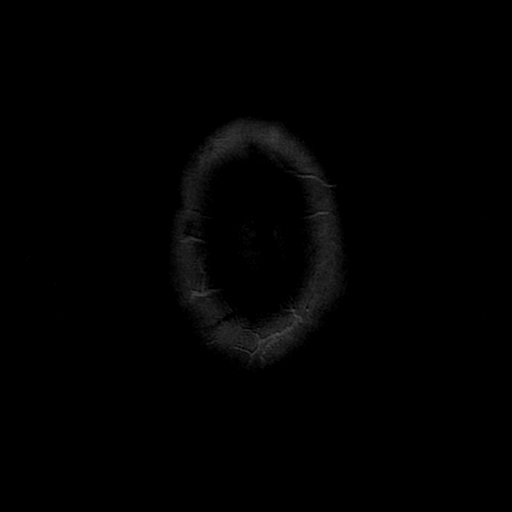

[Series 6: FLAIR · axial · 5.0mm · 0.43mm/px · z∈[-90,+64]mm · 2 of 12 slices shown (1 of 2)]
[im 1/12]
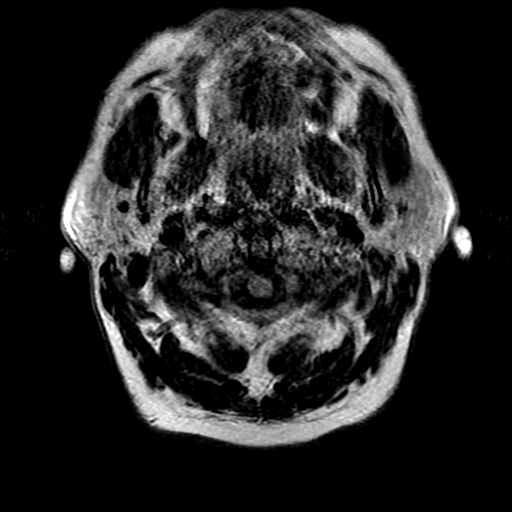
[im 12/12]
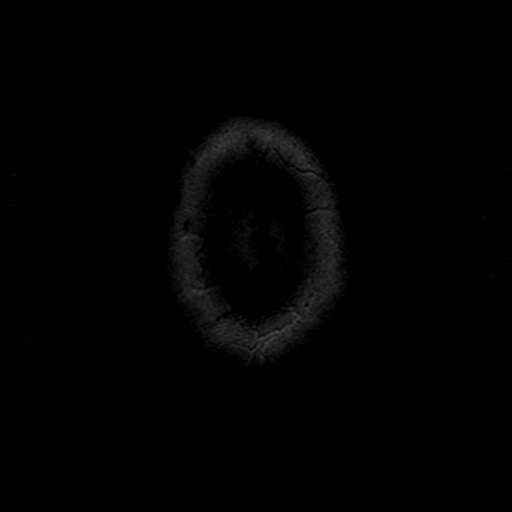

[Series 7: FLAIR · axial · 5.0mm · 0.43mm/px · z∈[-90,+71]mm · 3 of 24 slices shown (2 of 2)]
[im 1/24]
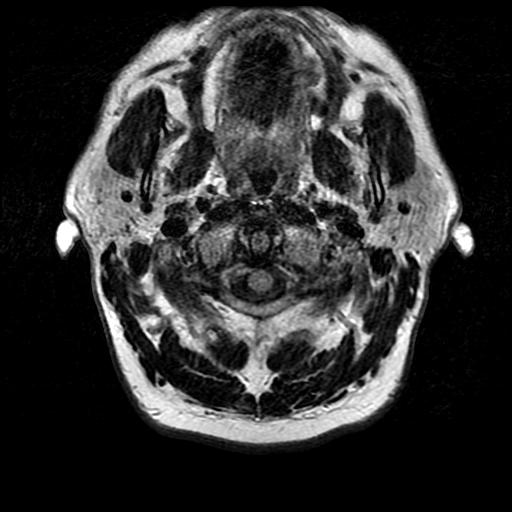
[im 12/24]
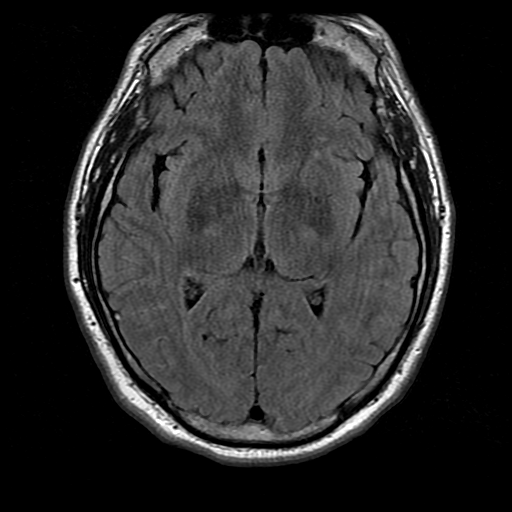
[im 24/24]
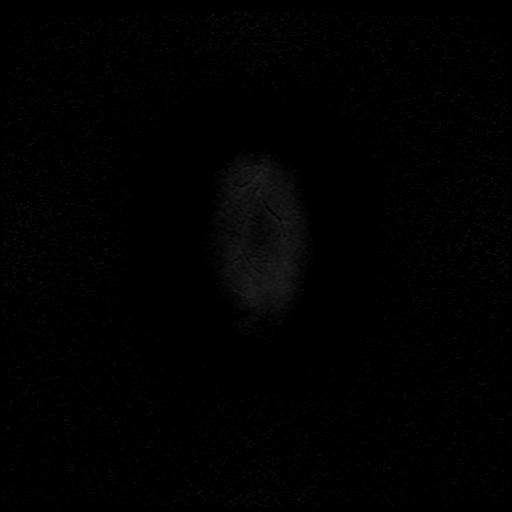

[Series 10: T2 post-contrast · coronal · 5.0mm · 0.45mm/px · 2 of 24 slices shown]
[im 1/24]
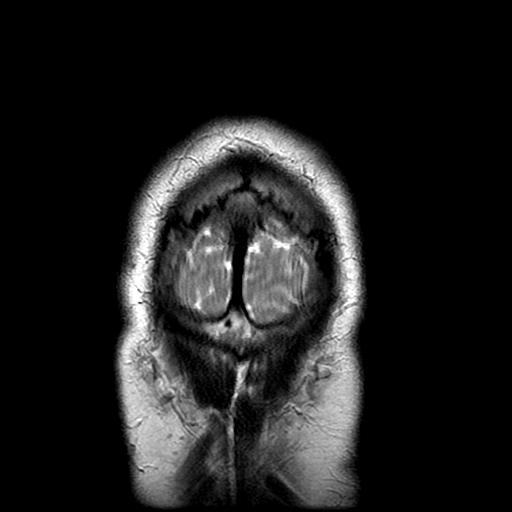
[im 12/24]
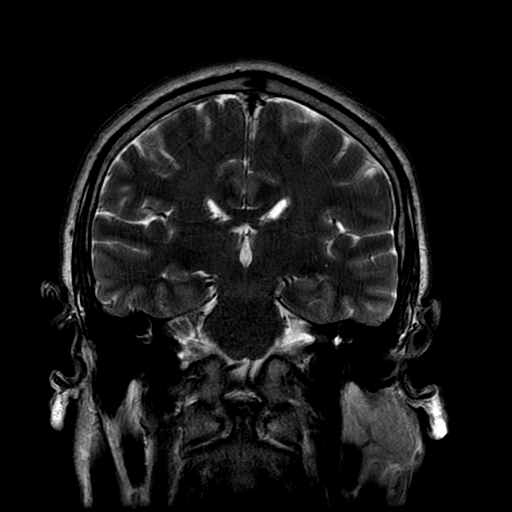

[Series 12: T1 post-contrast · coronal · 5.0mm · 0.45mm/px · 3 of 24 slices shown (1 of 2)]
[im 1/24]
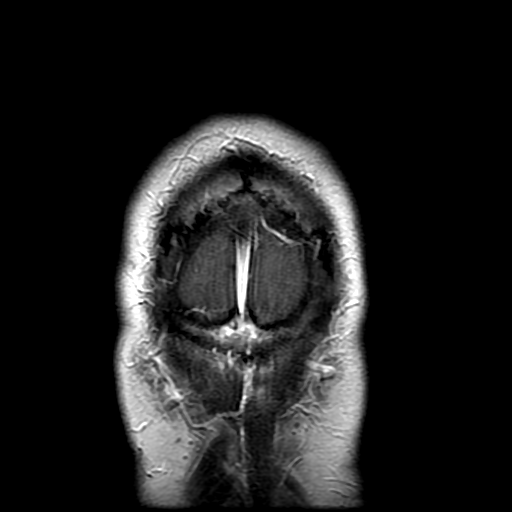
[im 12/24]
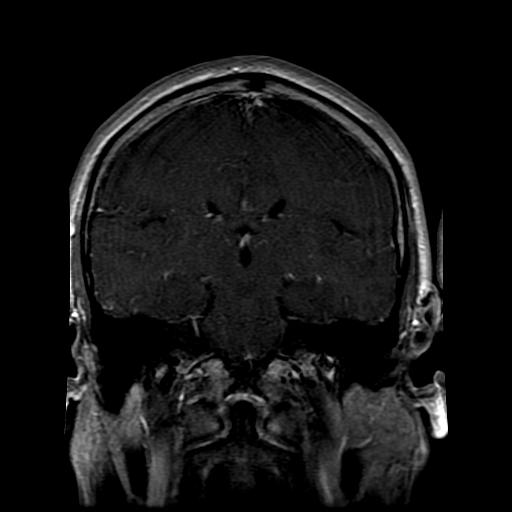
[im 24/24]
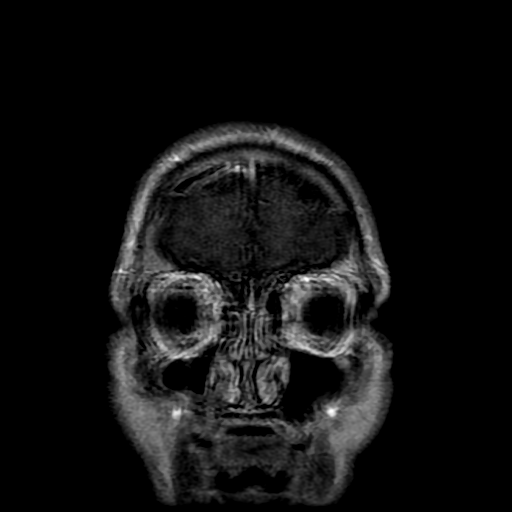

[Series 13: T1 post-contrast · sagittal · 5.0mm · 0.47mm/px · 3 of 21 slices shown (2 of 2)]
[im 1/21]
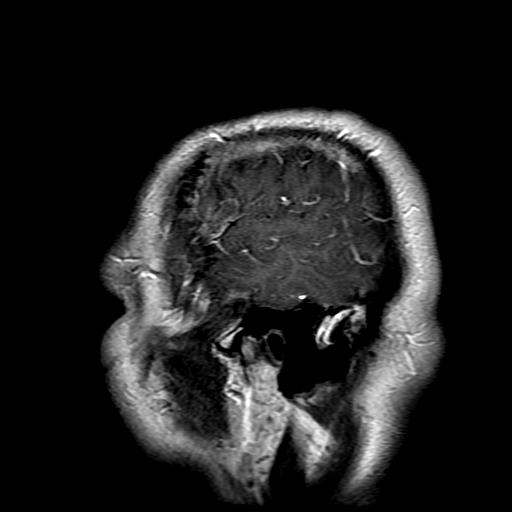
[im 11/21]
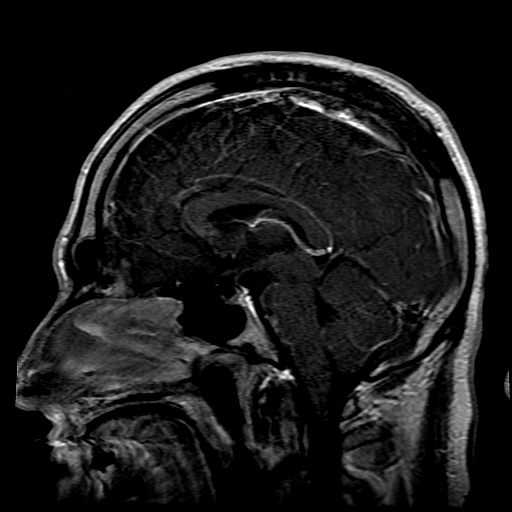
[im 21/21]
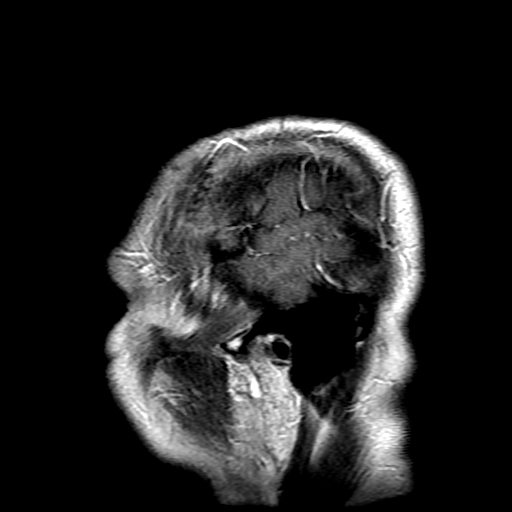

[Series 300: DWI · axial · 5.0mm · 1.09mm/px · z∈[-82,+63]mm · 4 of 30 slices shown (2 of 2)]
[im 1/30]
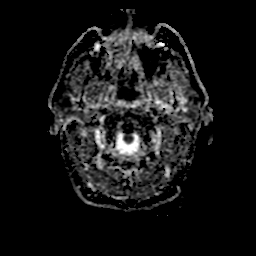
[im 10/30]
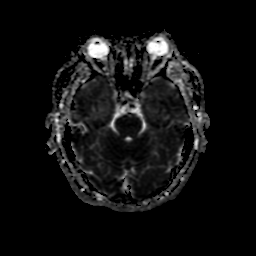
[im 20/30]
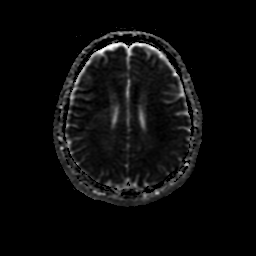
[im 30/30]
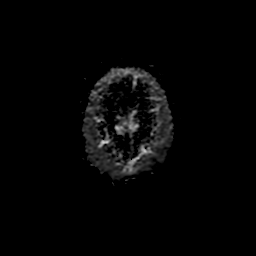

[27 of 48 positions shown; findings below may reference images not displayed]

FINDINGS: Images are slightly motion degraded.

No acute infarct.

No intracranial hemorrhage.

No hydrocephalus.

No intracranial mass or abnormal enhancement.

Tortuous right middle cerebral artery versus aneurysm of the M1
segment/bifurcation.  MR angiogram can be obtained for further
delineation.

Major intracranial vascular structures are patent.

Cervical medullary junction unremarkable.

Partially empty sella.  Pineal region and orbital structures
unremarkable.

Minimal to mild mucosal thickening ethmoid sinus air cells and
right maxillary sinus.
IMPRESSION: Images are slightly motion degraded.

No acute infarct.

No intracranial hemorrhage.

No intracranial mass or abnormal enhancement.

Tortuous right middle cerebral artery versus aneurysm of the M1
segment/bifurcation.  MR angiogram can be obtained for further
delineation.

## 2015-02-26 MED ORDER — ALPRAZOLAM 1 MG PO TABS: ORAL_TABLET | ORAL | Status: DC

## 2015-02-26 MED ORDER — HYDROMORPHONE HCL 4 MG PO TABS
4.0000 mg | ORAL_TABLET | ORAL | Status: DC | PRN
Start: 2015-02-26 — End: 2015-03-27

## 2015-02-26 NOTE — Telephone Encounter (Signed)
Patient calling for a refill on xanax and dilaudid. Signed script left at front for patient p/u. Patient aware.

## 2015-03-01 ENCOUNTER — Encounter: Payer: Self-pay | Admitting: *Deleted

## 2015-03-01 NOTE — Progress Notes (Signed)
5 page fax request received from [place] and given to Carmelina Noun in [facility].

## 2015-03-06 ENCOUNTER — Other Ambulatory Visit (HOSPITAL_BASED_OUTPATIENT_CLINIC_OR_DEPARTMENT_OTHER): Payer: 59

## 2015-03-06 ENCOUNTER — Ambulatory Visit (HOSPITAL_BASED_OUTPATIENT_CLINIC_OR_DEPARTMENT_OTHER): Payer: 59 | Admitting: Oncology

## 2015-03-06 VITALS — BP 140/96 | HR 75 | Temp 97.7°F | Resp 20 | Ht 76.0 in | Wt 203.6 lb

## 2015-03-06 DIAGNOSIS — M899 Disorder of bone, unspecified: Secondary | ICD-10-CM

## 2015-03-06 DIAGNOSIS — C649 Malignant neoplasm of unspecified kidney, except renal pelvis: Secondary | ICD-10-CM

## 2015-03-06 DIAGNOSIS — C7889 Secondary malignant neoplasm of other digestive organs: Secondary | ICD-10-CM

## 2015-03-06 DIAGNOSIS — C7801 Secondary malignant neoplasm of right lung: Secondary | ICD-10-CM

## 2015-03-06 DIAGNOSIS — C642 Malignant neoplasm of left kidney, except renal pelvis: Secondary | ICD-10-CM

## 2015-03-06 LAB — CBC WITH DIFFERENTIAL/PLATELET
BASO%: 0.1 % (ref 0.0–2.0)
Basophils Absolute: 0 10*3/uL (ref 0.0–0.1)
EOS%: 1.4 % (ref 0.0–7.0)
Eosinophils Absolute: 0.1 10*3/uL (ref 0.0–0.5)
HCT: 45.8 % (ref 38.4–49.9)
HGB: 15.2 g/dL (ref 13.0–17.1)
LYMPH%: 28.6 % (ref 14.0–49.0)
MCH: 35.4 pg — ABNORMAL HIGH (ref 27.2–33.4)
MCHC: 33.2 g/dL (ref 32.0–36.0)
MCV: 106.8 fL — ABNORMAL HIGH (ref 79.3–98.0)
MONO#: 0.5 10*3/uL (ref 0.1–0.9)
MONO%: 7.2 % (ref 0.0–14.0)
NEUT#: 4.6 10*3/uL (ref 1.5–6.5)
NEUT%: 62.7 % (ref 39.0–75.0)
Platelets: 200 10*3/uL (ref 140–400)
RBC: 4.29 10*6/uL (ref 4.20–5.82)
RDW: 15 % — ABNORMAL HIGH (ref 11.0–14.6)
WBC: 7.4 10*3/uL (ref 4.0–10.3)
lymph#: 2.1 10*3/uL (ref 0.9–3.3)

## 2015-03-06 LAB — COMPREHENSIVE METABOLIC PANEL (CC13)
ALT: 34 U/L (ref 0–55)
AST: 15 U/L (ref 5–34)
Albumin: 3.8 g/dL (ref 3.5–5.0)
Alkaline Phosphatase: 89 U/L (ref 40–150)
Anion Gap: 11 mEq/L (ref 3–11)
BUN: 15.4 mg/dL (ref 7.0–26.0)
CO2: 27 mEq/L (ref 22–29)
Calcium: 9.5 mg/dL (ref 8.4–10.4)
Chloride: 100 mEq/L (ref 98–109)
Creatinine: 1.2 mg/dL (ref 0.7–1.3)
EGFR: 70 mL/min/{1.73_m2} — ABNORMAL LOW (ref >90)
Glucose: 252 mg/dl — ABNORMAL HIGH (ref 70–140)
Potassium: 4.6 mEq/L (ref 3.5–5.1)
Sodium: 138 mEq/L (ref 136–145)
Total Bilirubin: 0.33 mg/dL (ref 0.20–1.20)
Total Protein: 7.4 g/dL (ref 6.4–8.3)

## 2015-03-06 NOTE — Progress Notes (Signed)
Hematology and Oncology Follow Up Visit  Keith [name] [id number] [birth year] 49 y.o. 03/06/2015 8:27 AM  Keith Bring, MD  Keith [name], M.D.  Keith Bent, MD  Keith Banister, MD    Principle Diagnosis: This is a 49 year old gentleman with stage IV renal cell carcinoma diagnosed in 2009.  Prior Therapy: 1. Status post laparoscopic radical nephrectomy.  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  2. Patient status post thoracotomy for a synchronous metastatic lung lesions done October 2009.  He had a lower lobe nodule, biopsy proven to be metastatic renal cell carcinoma.   3. Patient is status post stereotactic radiotherapy to pulmonary nodules in May of 2010. 4. He is S/P Sutent 50 mg 4 weeks on 2 weeks off from 10/2010 to 03/2013. He progressed at that time.  5. He is S/P radiation to the right sacral bone between 4/22 to 4/30.  6. He is S/P XRT to the left shoulder 03/20/14 to 03/31/14.   Current therapy: He is on Votrient 800 mg since 03/2013. His disease includes lung lesions, pancreatic as well as right sacral lesion.   Interim History:  Keith [name] presents today for a followup visit. Since his last visit, he is doing much better overall. He noticed a significant improvement in his pain in the right hip and pelvic area. He received an injection into his hip which have helped his symptoms. He is taking oral Dilaudid 1-2 tablets a day only. He is functioning a lot better and able to eat much better. He is ambulating without any difficulties and has not reported any neurological deficits. He did not report any new complications or illnesses from Votrient.  He has not reported any nausea or vomiting. He is not reporting any constipation or diarrhea. Has not reported any recent hospitalizations or illnesses. He did not report any headaches blurred vision or double vision. Does not report any seizure activity or alteration of mental status. Is not reporting any  chest pain or difficulty breathing. Is not reporting any cough or hemoptysis. Does not report any abdominal pain or hematochezia. Does not report any hematuria but does report occasional hesitancy and nocturia. He does not report any rashes or lesions or petechiae. He does not report any lymphadenopathy. His performance status and activity level remain stable at this time. Remainder of his review of system is unremarkable.  Medications: I have reviewed the patient's current medications.  Current Outpatient Prescriptions  Medication Sig Dispense Refill  . ALPRAZolam (XANAX) 1 MG tablet TAKE 1 TABLET EVERY 8 HOURS AS NEEDED FOR ANXIETY 100 tablet 0  . DULoxetine (CYMBALTA) 60 MG capsule TAKE 1 CAPSULE TWICE DAILY 60 capsule 2  . gabapentin (NEURONTIN) 300 MG capsule Take 1 capsule (300 mg total) by mouth 3 (three) times daily. 90 capsule 3  . HYDROmorphone (DILAUDID) 4 MG tablet Take 1 tablet (4 mg total) by mouth every 4 (four) hours as needed for severe pain. 30 tablet 0  . ibuprofen (ADVIL,MOTRIN) 800 MG tablet Take 800 mg by mouth every 8 (eight) hours as needed.    . pazopanib (VOTRIENT) 200 MG tablet Take 4 tablets (800 mg total) by mouth every morning. Take on an empty stomach. 120 tablet 0  . VIAGRA 100 MG tablet Take 50 mg by mouth as needed for erectile dysfunction.      No current facility-administered medications for this visit.     Allergies:  Allergies  Allergen Reactions  . Ceftriaxone Hives  .  Hydrocodone Swelling    Past Medical History, Surgical history, Social history, and Family History were reviewed and updated.   Physical Exam: Blood pressure 140/96, pulse 75, temperature 97.7 F (36.5 C), temperature source Oral, resp. rate 20, height 6\' 4"  (1.93 m), weight 203 lb 9.6 oz (92.352 kg), SpO2 99 %. ECOG: 1 General appearance: alert and awake chronically ill-appearing.  Head: Normocephalic, without obvious abnormality.  Neck: no adenopathy, no masses.  Lymph nodes:  Cervical, supraclavicular, and axillary nodes normal. Heart:regular rate and rhythm, S1, S2.  Lung:chest clear, no wheezing, rales, normal symmetric air entry. No dullness to percussion.  Abdomen: soft, non-tender, without masses or organomegaly EXT:no edema, no despumation. No erythema noted.   Neuro: no focal deficits noted. No weakness noted in his lower extremities.  Lab Results: Lab Results  Component Value Date   WBC 7.9 01/31/2015   HGB 15.6 01/31/2015   HCT 47.3 01/31/2015   MCV 104.8* 01/31/2015   PLT 253 01/31/2015     Impression and Plan:  This is a pleasant 49 year old gentleman with the following issues. 1. Metastatic renal cell carcinoma.  He has documented disease to the lung and the pancreas.  He tolerating Votrient without complications. CT scan from 12/2014  showed mostly stable disease. The plan is to continue with the current dose and schedule. He will need a repeat CT scan and May 2016. This will be scheduled with his next visit. 2. Pancreatic thickening/mass.  This has been biopsy proven to be a renal cell metastasis. No change in treatment at this time.  3. Depression/Anxiety: He remains on Xanax and Cymbalta. His mood is a lot better today. 4. Weight loss: This seems to be improved at this time. 5. Sacral mass: S/P radiation to that area and recently received an injection into that area. He is reporting much improved pain control at this time.. 6. Neuropathic pain. He is on gabapentin and Dilaudid for pain. He is using much less started at this time which is certainly an encouraging sign of pain control. 7. Follow up: One month.  Keith Button, MD

## 2015-03-25 ENCOUNTER — Other Ambulatory Visit: Payer: Self-pay | Admitting: Oncology

## 2015-03-27 ENCOUNTER — Telehealth: Payer: Self-pay | Admitting: *Deleted

## 2015-03-27 ENCOUNTER — Other Ambulatory Visit: Payer: Self-pay | Admitting: *Deleted

## 2015-03-27 DIAGNOSIS — C7951 Secondary malignant neoplasm of bone: Secondary | ICD-10-CM

## 2015-03-27 DIAGNOSIS — K611 Rectal abscess: Secondary | ICD-10-CM

## 2015-03-27 DIAGNOSIS — K8689 Other specified diseases of pancreas: Secondary | ICD-10-CM

## 2015-03-27 DIAGNOSIS — C649 Malignant neoplasm of unspecified kidney, except renal pelvis: Secondary | ICD-10-CM

## 2015-03-27 MED ORDER — HYDROMORPHONE HCL 4 MG PO TABS: 4.0000 mg | ORAL_TABLET | ORAL | Status: DC | PRN

## 2015-03-27 NOTE — Telephone Encounter (Signed)
This RN called and spoke with patient that his prescriptions were ready for pick up. Patient verbalized understanding.

## 2015-03-27 NOTE — Telephone Encounter (Signed)
Received fax from [place]. Taken to [facility]

## 2015-04-06 ENCOUNTER — Ambulatory Visit: Payer: 59 | Admitting: Physician Assistant

## 2015-04-06 ENCOUNTER — Other Ambulatory Visit: Payer: 59

## 2015-04-09 ENCOUNTER — Telehealth: Payer: Self-pay | Admitting: Oncology

## 2015-04-09 NOTE — Telephone Encounter (Signed)
Returned patient voicemail needing to reschedule appointment from 04/15 missed appointment. Patient confirmed 04/20 appointment.

## 2015-04-11 ENCOUNTER — Other Ambulatory Visit (HOSPITAL_BASED_OUTPATIENT_CLINIC_OR_DEPARTMENT_OTHER): Payer: 59

## 2015-04-11 ENCOUNTER — Telehealth: Payer: Self-pay | Admitting: Physician Assistant

## 2015-04-11 ENCOUNTER — Telehealth: Payer: Self-pay | Admitting: *Deleted

## 2015-04-11 ENCOUNTER — Ambulatory Visit (HOSPITAL_BASED_OUTPATIENT_CLINIC_OR_DEPARTMENT_OTHER): Payer: 59 | Admitting: Physician Assistant

## 2015-04-11 ENCOUNTER — Encounter: Payer: Self-pay | Admitting: Physician Assistant

## 2015-04-11 VITALS — BP 143/101 | HR 80 | Temp 98.5°F | Resp 22 | Ht 76.0 in | Wt 198.6 lb

## 2015-04-11 DIAGNOSIS — C7889 Secondary malignant neoplasm of other digestive organs: Secondary | ICD-10-CM

## 2015-04-11 DIAGNOSIS — M899 Disorder of bone, unspecified: Secondary | ICD-10-CM | POA: Diagnosis not present

## 2015-04-11 DIAGNOSIS — C642 Malignant neoplasm of left kidney, except renal pelvis: Secondary | ICD-10-CM | POA: Diagnosis not present

## 2015-04-11 DIAGNOSIS — C649 Malignant neoplasm of unspecified kidney, except renal pelvis: Secondary | ICD-10-CM

## 2015-04-11 DIAGNOSIS — R634 Abnormal weight loss: Secondary | ICD-10-CM

## 2015-04-11 DIAGNOSIS — C7801 Secondary malignant neoplasm of right lung: Secondary | ICD-10-CM | POA: Diagnosis not present

## 2015-04-11 DIAGNOSIS — I10 Essential (primary) hypertension: Secondary | ICD-10-CM

## 2015-04-11 DIAGNOSIS — M792 Neuralgia and neuritis, unspecified: Secondary | ICD-10-CM

## 2015-04-11 LAB — COMPREHENSIVE METABOLIC PANEL (CC13)
ALT: 38 U/L (ref 0–55)
AST: 25 U/L (ref 5–34)
Albumin: 3.7 g/dL (ref 3.5–5.0)
Alkaline Phosphatase: 96 U/L (ref 40–150)
Anion Gap: 14 mEq/L — ABNORMAL HIGH (ref 3–11)
BUN: 10.2 mg/dL (ref 7.0–26.0)
CO2: 24 mEq/L (ref 22–29)
Calcium: 9.7 mg/dL (ref 8.4–10.4)
Chloride: 103 mEq/L (ref 98–109)
Creatinine: 1.1 mg/dL (ref 0.7–1.3)
EGFR: 83 mL/min/{1.73_m2} — ABNORMAL LOW (ref >90)
Glucose: 128 mg/dl (ref 70–140)
Potassium: 4.1 mEq/L (ref 3.5–5.1)
Sodium: 142 mEq/L (ref 136–145)
Total Bilirubin: 0.45 mg/dL (ref 0.20–1.20)
Total Protein: 7.4 g/dL (ref 6.4–8.3)

## 2015-04-11 LAB — CBC WITH DIFFERENTIAL/PLATELET
BASO%: 0.6 % (ref 0.0–2.0)
Basophils Absolute: 0 10*3/uL (ref 0.0–0.1)
EOS%: 2.2 % (ref 0.0–7.0)
Eosinophils Absolute: 0.2 10*3/uL (ref 0.0–0.5)
HCT: 43.9 % (ref 38.4–49.9)
HGB: 14.5 g/dL (ref 13.0–17.1)
LYMPH%: 27.9 % (ref 14.0–49.0)
MCH: 35.4 pg — ABNORMAL HIGH (ref 27.2–33.4)
MCHC: 33.1 g/dL (ref 32.0–36.0)
MCV: 106.9 fL — ABNORMAL HIGH (ref 79.3–98.0)
MONO#: 0.4 10*3/uL (ref 0.1–0.9)
MONO%: 6.3 % (ref 0.0–14.0)
NEUT#: 4.5 10*3/uL (ref 1.5–6.5)
NEUT%: 63 % (ref 39.0–75.0)
Platelets: 247 10*3/uL (ref 140–400)
RBC: 4.11 10*6/uL — ABNORMAL LOW (ref 4.20–5.82)
RDW: 15.8 % — ABNORMAL HIGH (ref 11.0–14.6)
WBC: 7.1 10*3/uL (ref 4.0–10.3)
lymph#: 2 10*3/uL (ref 0.9–3.3)

## 2015-04-11 NOTE — Telephone Encounter (Signed)
Left Message [phone] for patient to call back regarding BP check.

## 2015-04-11 NOTE — Progress Notes (Signed)
Hematology and Oncology Follow Up Visit  Demarko Zeimet [id number] 1966/11/09 49 y.o. 04/11/2015 3:15 PM  [name], MD  [name], M.D.  [name], MD  [name], MD    Principle Diagnosis: This is a 49 year old gentleman with stage IV renal cell carcinoma diagnosed in 2009.  Prior Therapy: 1. Status post laparoscopic radical nephrectomy.  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  2. Patient status post thoracotomy for a synchronous metastatic lung lesions done October 2009.  He had a lower lobe nodule, biopsy proven to be metastatic renal cell carcinoma.   3. Patient is status post stereotactic radiotherapy to pulmonary nodules in May of 2010. 4. He is S/P Sutent 50 mg 4 weeks on 2 weeks off from 10/2010 to 03/2013. He progressed at that time.  5. He is S/P radiation to the right sacral bone between 4/22 to 4/30.  6. He is S/P XRT to the left shoulder 03/20/14 to 03/31/14.   Current therapy: He is on Votrient 800 mg since 03/2013. His disease includes lung lesions, pancreatic as well as right sacral lesion.   Interim History:  Mr. Koskinen presents today for a followup visit. Since his last visit, he continues to tolerate his Votrient without significant difficulty. I will have diarrhea perhaps 3 episodes a day but Imodium was helpful. He complains of increased back and right leg pain after seeing Dr. Luan Pulling his neurosurgeon last Thursday. He was given an injection but states that he had significant nausea and vomiting after the injection as well as increased pain in his back and numbness in his right foot. He states he did notify the neurosurgeon's office and is awaiting a call back regarding next steps for him. He reports that he was up all night, getting barely one hour of sleep because of the increased pain. He reports that he had much better pain control after his first, what he thinks was a nerve block.   He is functioning and is able to  eat much better. He is ambulating without any overt lower extremity weakness difficulties and has not reported any neurological deficits. He did not report any new complications or illnesses from Votrient.  He has not reported any other episodes of nausea or vomiting. He is not reporting any constipation or diarrhea. Has not reported any recent hospitalizations or illnesses. He did not report any headaches blurred vision or double vision. Does not report any seizure activity or alteration of mental status. Is not reporting any chest pain or difficulty breathing. Is not reporting any cough or hemoptysis. Does not report any abdominal pain or hematochezia. Does not report any hematuria but does report occasional hesitancy and nocturia. He does not report any rashes or lesions or petechiae. He does not report any lymphadenopathy. His performance status and activity level remain relatively stable at this time. Remainder of his review of system is unremarkable.  Medications: I have reviewed the patient's current medications.  Current Outpatient Prescriptions  Medication Sig Dispense Refill  . ALPRAZolam (XANAX) 1 MG tablet TAKE 1 TABLET EVERY 8 HOURS AS NEEDED FOR ANXIETY 100 tablet 0  . DULoxetine (CYMBALTA) 60 MG capsule TAKE 1 CAPSULE TWICE DAILY 60 capsule 2  . gabapentin (NEURONTIN) 300 MG capsule Take 1 capsule (300 mg total) by mouth 3 (three) times daily. 90 capsule 3  . HYDROmorphone (DILAUDID) 4 MG tablet Take 1 tablet (4 mg total) by mouth every 4 (four) hours as needed for severe  pain. 30 tablet 0  . ibuprofen (ADVIL,MOTRIN) 800 MG tablet Take 800 mg by mouth every 8 (eight) hours as needed.    . pazopanib (VOTRIENT) 200 MG tablet Take 4 tablets (800 mg total) by mouth every morning. Take on an empty stomach. 120 tablet 0  . VIAGRA 100 MG tablet Take 50 mg by mouth as needed for erectile dysfunction.      No current facility-administered medications for this visit.     Allergies:  Allergies   Allergen Reactions  . Ceftriaxone Hives  . Hydrocodone Swelling    Past Medical History, Surgical history, Social history, and Family History were reviewed and updated.   Physical Exam: Blood pressure 143/101, pulse 80, temperature 98.5 F (36.9 C), temperature source Oral, resp. rate 22, height '6\' 4"'$  (1.93 m), weight 198 lb 9.6 oz (90.084 kg), SpO2 100 %. ECOG: 1 General appearance: alert and awake chronically ill-appearing.  Head: Normocephalic, without obvious abnormality.  Neck: no adenopathy, no masses.  Lymph nodes: Cervical, supraclavicular, and axillary nodes normal. Heart:regular rate and rhythm, S1, S2.  Lung:chest clear, no wheezing, rales, normal symmetric air entry. No dullness to percussion.  Abdomen: soft, non-tender, without masses or organomegaly EXT:no edema, no despumation. No erythema noted.   Neuro: no focal deficits noted. No weakness noted in his lower extremities.  Lab Results: Lab Results  Component Value Date   WBC 7.1 04/11/2015   HGB 14.5 04/11/2015   HCT 43.9 04/11/2015   MCV 106.9* 04/11/2015   PLT 247 04/11/2015     Impression and Plan:  This is a pleasant 49 year old gentleman with the following issues. 1. Metastatic renal cell carcinoma.  He has documented disease to the lung and the pancreas.  He tolerating Votrient without complications. CT scan from 12/2014  showed mostly stable disease. The plan is to continue with the current dose and schedule. He will need a repeat CT scan and May 2016. This will be scheduled with his next visit. 2. Pancreatic thickening/mass.  This has been biopsy proven to be a renal cell metastasis. No change in treatment at this time.  3. Depression/Anxiety: He remains on Xanax and Cymbalta. His mood is better today. 4. Weight loss: This seems to be improved at this time. 5. Sacral mass: S/P radiation to that area and recently received an injection into that area. He is reporting much improved pain control at this  time.. 6. Neuropathic pain. He is on gabapentin and Dilaudid for pain. Increased pain recently after a injection at the neurosurgeon's office. Patient is encouraged to follow-up with a neurosurgeon as soon as possible.  7. Hypertension: This may be multifactorial related to patient's recent lack of sleep and increased pain however, I'm referring him to his primary care physician for further evaluation and management 8. Follow up: One month.  Wynetta Emery, Damar Petit E, PA-C

## 2015-04-11 NOTE — Patient Instructions (Signed)
See your primary care physician as soon as possible regarding your elevated blood pressure Continue Votrient 800 mg by mouth daily Follow-up in one month

## 2015-04-11 NOTE — Telephone Encounter (Signed)
Called home/cell number and it ios not a valid number,called the work number and he no longer works there.  Called his mother and she gave me [phone] and i have left a message for him to call me.

## 2015-04-12 ENCOUNTER — Telehealth: Payer: Self-pay

## 2015-04-12 ENCOUNTER — Other Ambulatory Visit: Payer: Self-pay | Admitting: *Deleted

## 2015-04-12 ENCOUNTER — Other Ambulatory Visit: Payer: Self-pay | Admitting: Anesthesiology

## 2015-04-12 DIAGNOSIS — K8689 Other specified diseases of pancreas: Secondary | ICD-10-CM

## 2015-04-12 DIAGNOSIS — C649 Malignant neoplasm of unspecified kidney, except renal pelvis: Secondary | ICD-10-CM

## 2015-04-12 DIAGNOSIS — C7951 Secondary malignant neoplasm of bone: Secondary | ICD-10-CM

## 2015-04-12 MED ORDER — PAZOPANIB HCL 200 MG PO TABS: 800.0000 mg | ORAL_TABLET | Freq: Every morning | ORAL | Status: DC

## 2015-04-12 NOTE — Telephone Encounter (Signed)
Called back [employer]. S/w Gerald Stabs. Pt's last visit was 04/11/15. His next visit is 5/25.

## 2015-04-12 NOTE — Telephone Encounter (Signed)
B[place] from [place] [employer] is doing a life status check on the pt. She has a disconnected phone number for the pt.

## 2015-04-17 ENCOUNTER — Other Ambulatory Visit: Payer: Self-pay | Admitting: *Deleted

## 2015-04-17 DIAGNOSIS — C7951 Secondary malignant neoplasm of bone: Secondary | ICD-10-CM

## 2015-04-17 DIAGNOSIS — C649 Malignant neoplasm of unspecified kidney, except renal pelvis: Secondary | ICD-10-CM

## 2015-04-17 DIAGNOSIS — K8689 Other specified diseases of pancreas: Secondary | ICD-10-CM

## 2015-04-17 MED ORDER — PAZOPANIB HCL 200 MG PO TABS: 800.0000 mg | ORAL_TABLET | Freq: Every morning | ORAL | Status: DC

## 2015-04-18 ENCOUNTER — Telehealth: Payer: Self-pay

## 2015-04-18 NOTE — Telephone Encounter (Signed)
[employer] patient [employer]itance [employer] application received and forwarded to Raquel.

## 2015-04-21 IMAGING — CT CT CHEST W/ CM
2 of 9 series · 13 of 46 positions shown, 18 images · IV contrast (OMNIPAQUE)
Comparison: 03/22/2013

CT CHEST

CLINICAL DATA: Renal cell carcinoma.

CT CHEST, ABDOMEN AND PELVIS WITH CONTRAST
TECHNIQUE: Multidetector CT imaging of the chest, abdomen and
pelvis was performed following the standard protocol during bolus
administration of intravenous contrast.
Contrast: 100mL OMNIPAQUE IOHEXOL 300 MG/ML  SOLN

[Series 6: venous thins pacs · axial · portal-venous · 0.84mm/px · z∈[-540,+36]mm · 10 of 232 slices shown, 15 images]
[im 20/232  soft-tissue]
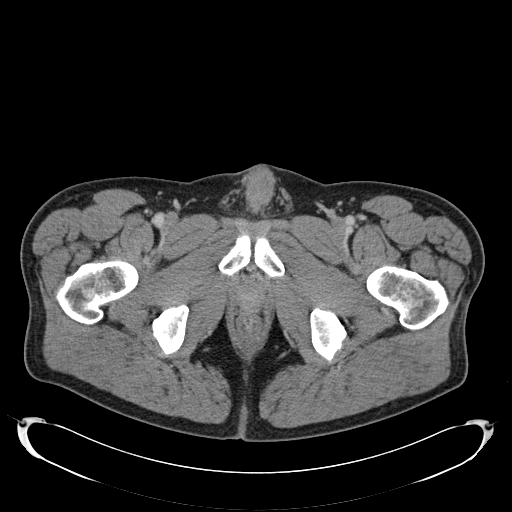
[im 20/232  bone]
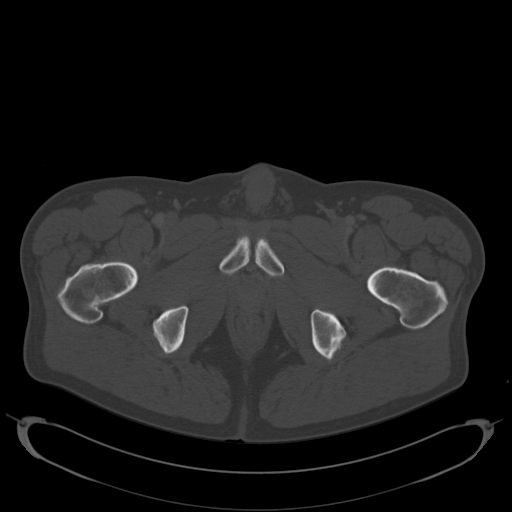
[im 39/232  soft-tissue]
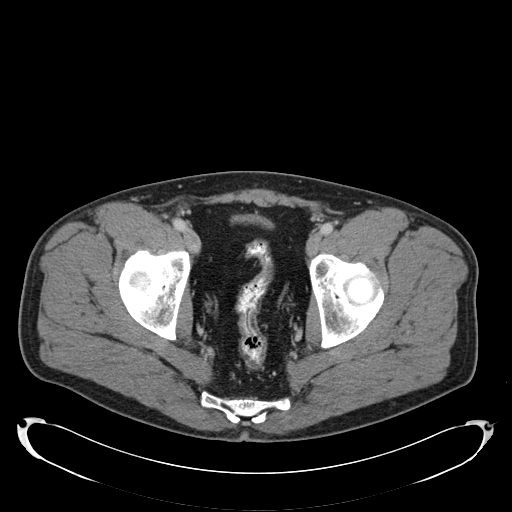
[im 78/232  soft-tissue]
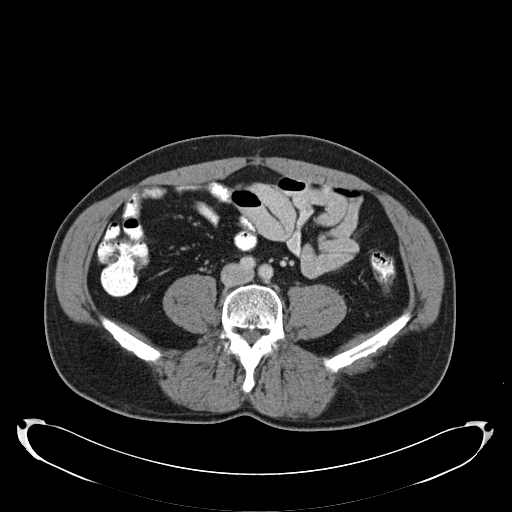
[im 97/232  soft-tissue]
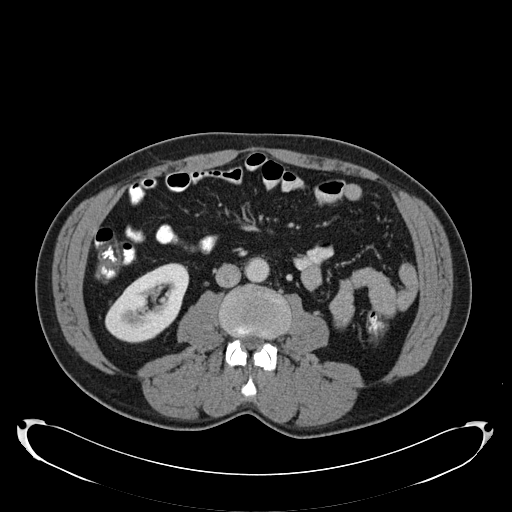
[im 116/232  soft-tissue]
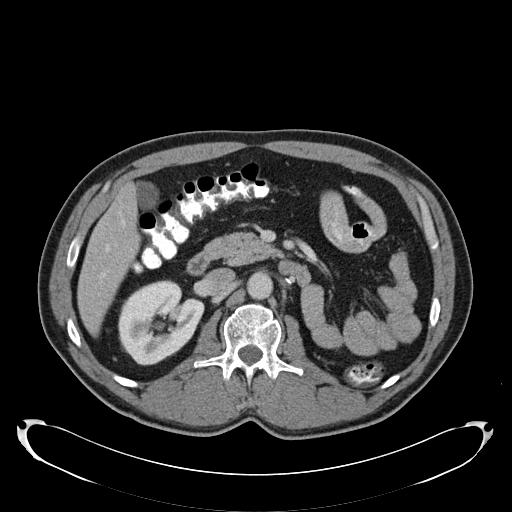
[im 135/232  soft-tissue]
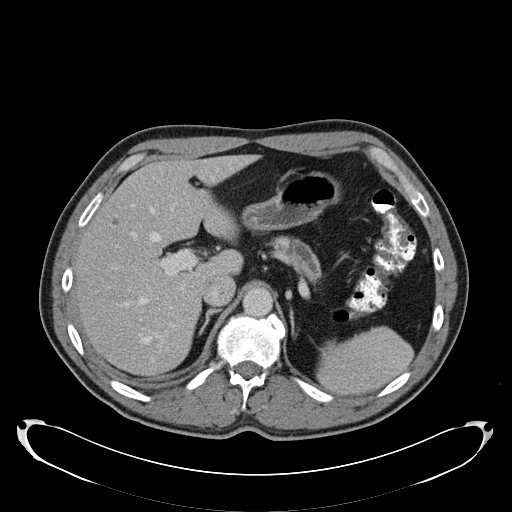
[im 155/232  soft-tissue]
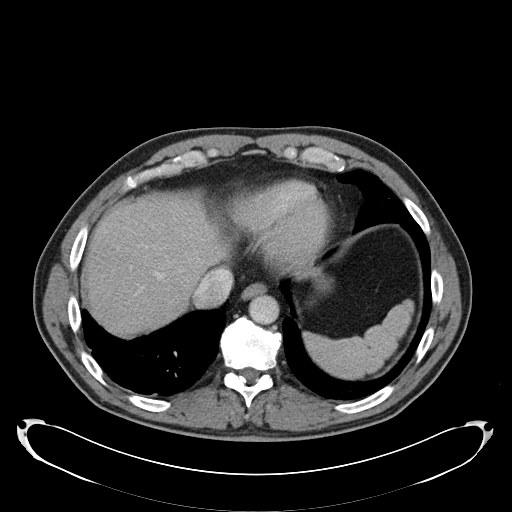
[im 155/232  lung]
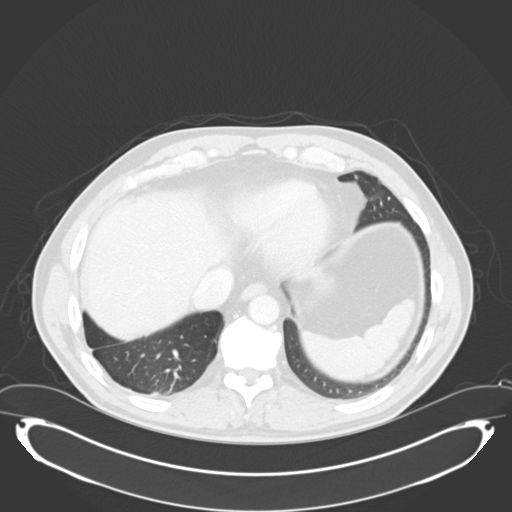
[im 174/232  lung]
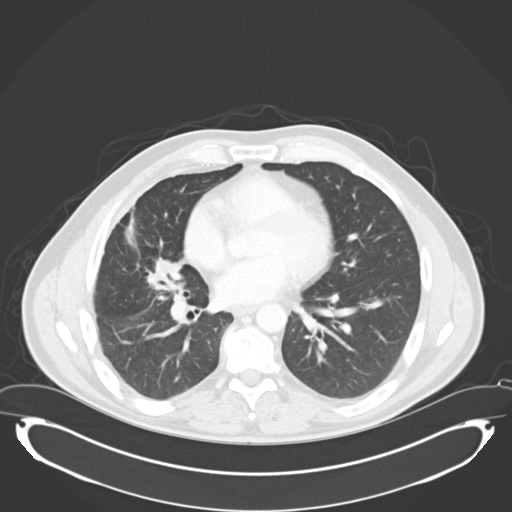
[im 193/232  soft-tissue]
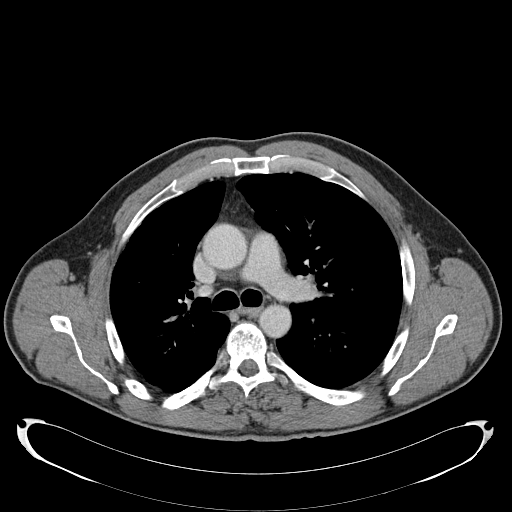
[im 193/232  lung]
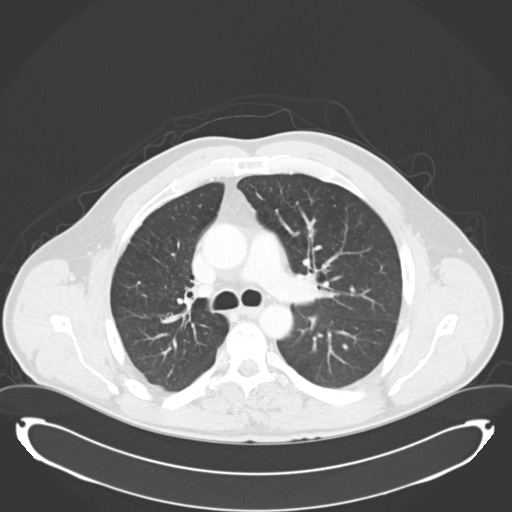
[im 212/232  soft-tissue]
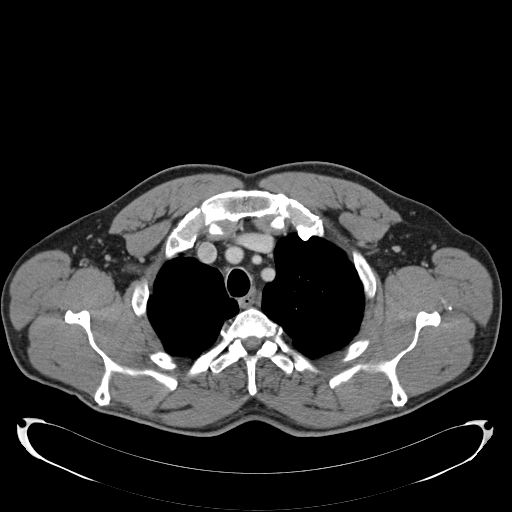
[im 212/232  lung]
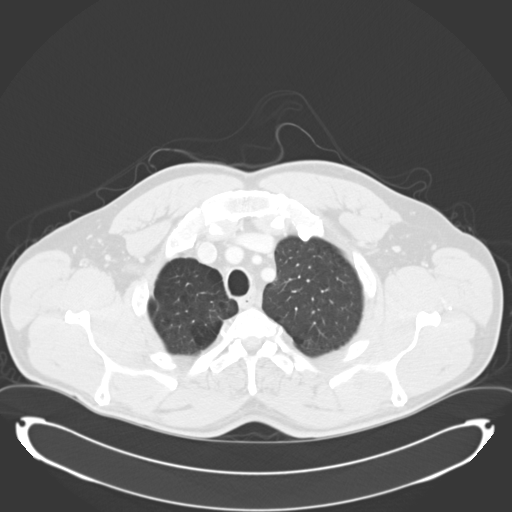
[im 212/232  bone]
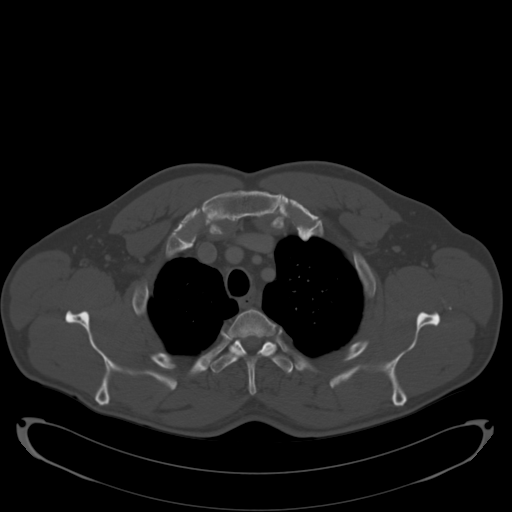

[Series 602: <mpr thick range> · coronal · 0.84mm/px · 3 of 122 slices shown]
[im 25/122  soft-tissue]
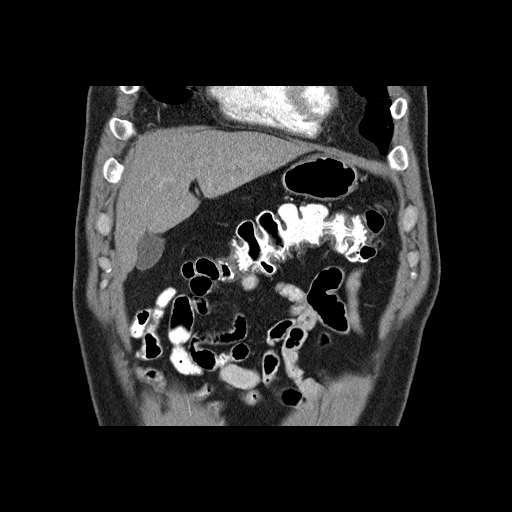
[im 49/122  soft-tissue]
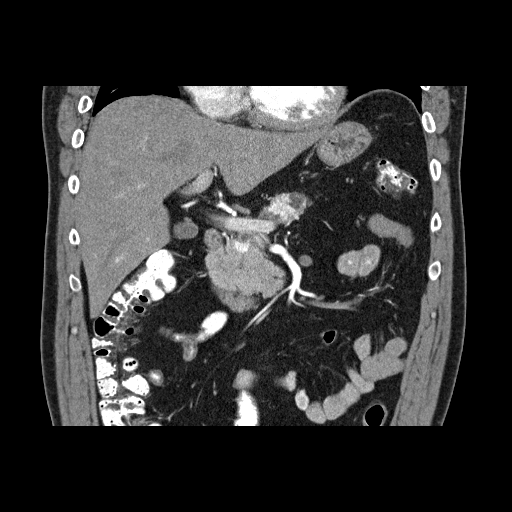
[im 73/122  soft-tissue]
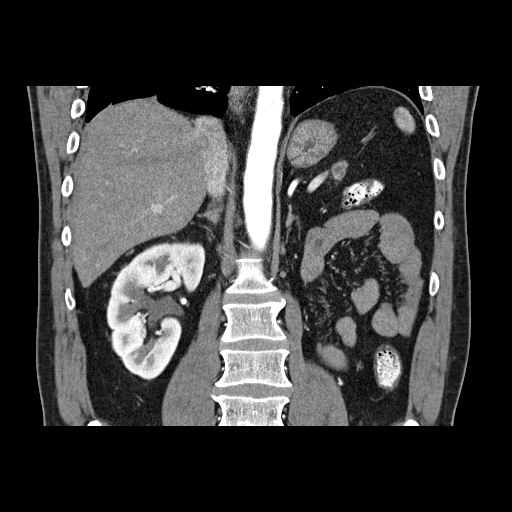

[13 of 46 positions shown; findings below may reference images not displayed]

FINDINGS: There is no pleural effusion identified.  There are postoperative
changes and volume loss involving the right lung.  Changes of
external beam radiation are identified within the right lung.
Scarring and architectural distortion is noted in the right lung
base and right midlung.  Right apical scar is identified, image
18/series 7 and appears similar to previous study.  Similar
appearance of the soft tissue around the proximal aspects of the
medial and lateral segment bronchi to the right middle lobe.  This
measures 2.2 x 2.1 cm, image 34/series 7.  This is compared with
1.7 x 2.1 cm previously. Stable right midlung nodule measuring 4
mm, image 27/series 7.

Mild cardiac enlargement.  No pericardial effusion.  No enlarged or
enlarging mediastinal or hilar lymph nodes identified.

There is no axillary or supraclavicular adenopathy.  Review of the
visualized osseous structures shows multilevel degenerative disc
disease.  There are no aggressive lytic or sclerotic bone lesions.
IMPRESSION: 1.  No significant change compared with previous exam.
2.  Stable soft tissue between the medial and lateral segmental
bronchi to the right middle lobe.

CT ABDOMEN AND PELVIS
FINDINGS: No suspicious liver abnormalities identified.
Gallbladder is normal.  No biliary dilatation.  Multiple
hypervascular lesions are noted within the pancreas.  Index lesion
within the head of pancreas measures 1.4 cm, image 45/series 2.
Previously 1.3 cm.  Within the body of pancreas there is a 3 cm
lesion, image 35/series 2.  This is compared with 2.9 cm
previously.  Neck of pancreas lesion measures 1.1 cm, image
47/series 2.  Previously this measured the same.  Normal appearance
of the spleen.

The adrenal glands are normal.  Status post left nephrectomy.
Enhancing lesion within the inferior pole of the right kidney
measures 1.1 cm, image 78/series 2.  This is compared with 1.2 cm
previously.  Urinary bladder appears within normal limits.

Normal caliber of the abdominal aorta.  There is no aneurysm.  No
upper abdominal adenopathy noted.  There is no pelvic or inguinal
adenopathy noted.  No free fluid or fluid collections identified.

The stomach is normal.  The small bowel loops are unremarkable.
Normal appearance of the appendix.  The colon is unremarkable.

No free fluid or abnormal fluid collections within the abdomen or
pelvis.

Review of the visualized bony structures shows a lytic lesion
within the right sacral wing measuring 3.2 cm, image 178/series 6.
This is compared with 2.6 cm previously.
IMPRESSION: 1.  No significant change in the appearance of hypervascular
pancreas metastasis.
2.  Increase in size of right sacral wing lytic metastasis.

## 2015-04-24 ENCOUNTER — Encounter: Payer: Self-pay | Admitting: Oncology

## 2015-04-24 ENCOUNTER — Telehealth: Payer: Self-pay | Admitting: *Deleted

## 2015-04-24 ENCOUNTER — Other Ambulatory Visit: Payer: Self-pay | Admitting: *Deleted

## 2015-04-24 DIAGNOSIS — K8689 Other specified diseases of pancreas: Secondary | ICD-10-CM

## 2015-04-24 DIAGNOSIS — C649 Malignant neoplasm of unspecified kidney, except renal pelvis: Secondary | ICD-10-CM

## 2015-04-24 DIAGNOSIS — C7951 Secondary malignant neoplasm of bone: Secondary | ICD-10-CM

## 2015-04-24 DIAGNOSIS — K611 Rectal abscess: Secondary | ICD-10-CM

## 2015-04-24 MED ORDER — HYDROMORPHONE HCL 4 MG PO TABS: 4.0000 mg | ORAL_TABLET | ORAL | Status: DC | PRN

## 2015-04-24 NOTE — Progress Notes (Signed)
I mailed the form for asst for votrient to the patient. Dr. [name] signed.

## 2015-04-24 NOTE — Progress Notes (Signed)
I placed [employer] form for possible asst for votrient on desk of nurse of dr. [name].

## 2015-04-24 NOTE — Telephone Encounter (Signed)
Pt left a message stating he needs a refill on pain med and xanax.  Will forward to [name]

## 2015-04-25 ENCOUNTER — Telehealth: Payer: Self-pay | Admitting: *Deleted

## 2015-04-25 ENCOUNTER — Other Ambulatory Visit: Payer: Self-pay | Admitting: *Deleted

## 2015-04-25 DIAGNOSIS — F419 Anxiety disorder, unspecified: Secondary | ICD-10-CM

## 2015-04-25 DIAGNOSIS — K611 Rectal abscess: Secondary | ICD-10-CM

## 2015-04-25 DIAGNOSIS — C7951 Secondary malignant neoplasm of bone: Secondary | ICD-10-CM

## 2015-04-25 DIAGNOSIS — K8689 Other specified diseases of pancreas: Secondary | ICD-10-CM

## 2015-04-25 DIAGNOSIS — C649 Malignant neoplasm of unspecified kidney, except renal pelvis: Secondary | ICD-10-CM

## 2015-04-25 MED ORDER — ALPRAZOLAM 1 MG PO TABS: ORAL_TABLET | ORAL | Status: DC

## 2015-04-25 NOTE — Telephone Encounter (Signed)
TC from patient inquiring about refills on Xanax and Dilaudid he requested yesterday. Informed him the prescriptions were available for pick up today. He voiced understanding.

## 2015-04-25 NOTE — Telephone Encounter (Signed)
This RN tried calling patient's home number several times but it was busy each time. Trying to call to let patient know that his prescriptions are ready for pick up. Cell phone number had been disconnected.

## 2015-05-02 ENCOUNTER — Encounter: Payer: Self-pay | Admitting: Skilled Nursing Facility1

## 2015-05-02 NOTE — Progress Notes (Signed)
Subjective:     Patient ID: Keith [name], male   DOB: 07/09/1966, 49 y.o.   MRN: [id number]  HPI   Review of Systems     Objective:   Physical Exam To assist the pt in identifieing some dietary strategies to gain wt.     Assessment:     Pt identified as being malnourished due to lost wt. Pt was contacted via the telephone and a voicemail was left. Pt called back. Pt states he lost about 15 pounds. Pt states he has ensure but he does not like it so he does not drink it. Pt also states he is constipated due to his medications.     Plan:     Dietitian offered some ensure recipes to make the supplement more palatable for the pt. Dietitian also offered some strategies to make his foods more calorically dense by adding cheese or whole milk. Dietitian advised he drink water throughout the day and be as active as he can be in order to move his bowel which will assist in increasing his appetite.

## 2015-05-02 NOTE — Progress Notes (Signed)
Subjective:     Patient ID: Keith [name], male   DOB: 05-06-1966, 49 y.o.   MRN: [id number]  HPI   Review of Systems     Objective:   Physical Exam To assist the pt in identifing some dietary strategies to gain some lost wt back.     Assessment:     Pt was identified as being malnourished due to lost wt. Pt was contacted via the telephone-[phone]. Pt was unavailable.    Plan:       Dietitian left a voicemail prompting the pt to contact Ernestene Kiel RD,LDN,CSO at [phone].

## 2015-05-08 ENCOUNTER — Encounter (HOSPITAL_COMMUNITY): Payer: Self-pay

## 2015-05-09 ENCOUNTER — Encounter (HOSPITAL_COMMUNITY): Payer: Self-pay

## 2015-05-09 ENCOUNTER — Encounter (HOSPITAL_COMMUNITY)
Admission: RE | Admit: 2015-05-09 | Discharge: 2015-05-09 | Disposition: A | Discharge status: Home / Self Care | Payer: 59 | Source: Ambulatory Visit | Attending: Anesthesiology | Admitting: Anesthesiology

## 2015-05-09 DIAGNOSIS — Z01810 Encounter for preprocedural cardiovascular examination: Secondary | ICD-10-CM | POA: Insufficient documentation

## 2015-05-09 DIAGNOSIS — Z01812 Encounter for preprocedural laboratory examination: Secondary | ICD-10-CM | POA: Insufficient documentation

## 2015-05-09 DIAGNOSIS — M5416 Radiculopathy, lumbar region: Secondary | ICD-10-CM | POA: Insufficient documentation

## 2015-05-09 HISTORY — DX: Essential (primary) hypertension: I10

## 2015-05-09 LAB — CBC
HCT: 45 % (ref 39.0–52.0)
Hemoglobin: 14.8 g/dL (ref 13.0–17.0)
MCH: 35.9 pg — ABNORMAL HIGH (ref 26.0–34.0)
MCHC: 32.9 g/dL (ref 30.0–36.0)
MCV: 109.2 fL — ABNORMAL HIGH (ref 78.0–100.0)
Platelets: 178 10*3/uL (ref 150–400)
RBC: 4.12 MIL/uL — ABNORMAL LOW (ref 4.22–5.81)
RDW: 15.5 % (ref 11.5–15.5)
WBC: 6.5 10*3/uL (ref 4.0–10.5)

## 2015-05-09 LAB — BASIC METABOLIC PANEL
Anion gap: 10 (ref 5–15)
BUN: 8 mg/dL (ref 6–20)
CO2: 27 mmol/L (ref 22–32)
Calcium: 9.3 mg/dL (ref 8.9–10.3)
Chloride: 101 mmol/L (ref 101–111)
Creatinine, Ser: 1.14 mg/dL (ref 0.61–1.24)
GFR calc Af Amer: >60 mL/min (ref >60)
GFR calc non Af Amer: >60 mL/min (ref >60)
Glucose, Bld: 223 mg/dL — ABNORMAL HIGH (ref 65–99)
Potassium: 4.9 mmol/L (ref 3.5–5.1)
Sodium: 138 mmol/L (ref 135–145)

## 2015-05-15 ENCOUNTER — Other Ambulatory Visit: Payer: Self-pay | Admitting: *Deleted

## 2015-05-15 ENCOUNTER — Encounter: Payer: Self-pay | Admitting: Oncology

## 2015-05-15 DIAGNOSIS — C649 Malignant neoplasm of unspecified kidney, except renal pelvis: Secondary | ICD-10-CM

## 2015-05-15 DIAGNOSIS — C7951 Secondary malignant neoplasm of bone: Secondary | ICD-10-CM

## 2015-05-15 DIAGNOSIS — K8689 Other specified diseases of pancreas: Secondary | ICD-10-CM

## 2015-05-15 MED ORDER — PAZOPANIB HCL 200 MG PO TABS: 800.0000 mg | ORAL_TABLET | Freq: Every morning | ORAL | Status: DC

## 2015-05-15 MED ORDER — VANCOMYCIN HCL IN DEXTROSE 1-5 GM/200ML-% IV SOLN
1000.0000 mg | INTRAVENOUS | Status: AC
  Filled 2015-05-15: qty 200

## 2015-05-15 NOTE — Progress Notes (Signed)
I placed app - [employer] on desk of nurse for dr. [name] for votrient asst. We need script to send with application.

## 2015-05-16 ENCOUNTER — Encounter: Payer: Self-pay | Admitting: Oncology

## 2015-05-16 ENCOUNTER — Ambulatory Visit (HOSPITAL_BASED_OUTPATIENT_CLINIC_OR_DEPARTMENT_OTHER): Payer: 59 | Admitting: Oncology

## 2015-05-16 ENCOUNTER — Other Ambulatory Visit (HOSPITAL_BASED_OUTPATIENT_CLINIC_OR_DEPARTMENT_OTHER): Payer: 59

## 2015-05-16 ENCOUNTER — Telehealth: Payer: Self-pay | Admitting: Oncology

## 2015-05-16 ENCOUNTER — Inpatient Hospital Stay (HOSPITAL_COMMUNITY): Payer: 59

## 2015-05-16 ENCOUNTER — Encounter (HOSPITAL_COMMUNITY)
Admission: RE | Disposition: A | Discharge status: Home / Self Care | Payer: Self-pay | Source: Ambulatory Visit | Attending: Anesthesiology

## 2015-05-16 ENCOUNTER — Ambulatory Visit (HOSPITAL_COMMUNITY)
Admission: RE | Admit: 2015-05-16 | Discharge: 2015-05-16 | DRG: 940 | Disposition: A | Discharge status: Home / Self Care | Payer: 59 | Source: Ambulatory Visit | Attending: Anesthesiology | Admitting: Anesthesiology

## 2015-05-16 ENCOUNTER — Inpatient Hospital Stay (HOSPITAL_COMMUNITY): Payer: 59 | Admitting: Anesthesiology

## 2015-05-16 ENCOUNTER — Encounter (HOSPITAL_COMMUNITY): Payer: Self-pay | Admitting: General Practice

## 2015-05-16 VITALS — BP 136/96 | HR 71 | Resp 16

## 2015-05-16 DIAGNOSIS — I10 Essential (primary) hypertension: Secondary | ICD-10-CM | POA: Insufficient documentation

## 2015-05-16 DIAGNOSIS — Z885 Allergy status to narcotic agent status: Secondary | ICD-10-CM | POA: Insufficient documentation

## 2015-05-16 DIAGNOSIS — F17210 Nicotine dependence, cigarettes, uncomplicated: Secondary | ICD-10-CM | POA: Diagnosis not present

## 2015-05-16 DIAGNOSIS — C7989 Secondary malignant neoplasm of other specified sites: Secondary | ICD-10-CM | POA: Diagnosis not present

## 2015-05-16 DIAGNOSIS — C7889 Secondary malignant neoplasm of other digestive organs: Secondary | ICD-10-CM | POA: Insufficient documentation

## 2015-05-16 DIAGNOSIS — K611 Rectal abscess: Secondary | ICD-10-CM

## 2015-05-16 DIAGNOSIS — C7951 Secondary malignant neoplasm of bone: Secondary | ICD-10-CM | POA: Insufficient documentation

## 2015-05-16 DIAGNOSIS — G629 Polyneuropathy, unspecified: Secondary | ICD-10-CM

## 2015-05-16 DIAGNOSIS — K219 Gastro-esophageal reflux disease without esophagitis: Secondary | ICD-10-CM | POA: Diagnosis not present

## 2015-05-16 DIAGNOSIS — Z79899 Other long term (current) drug therapy: Secondary | ICD-10-CM | POA: Insufficient documentation

## 2015-05-16 DIAGNOSIS — Z923 Personal history of irradiation: Secondary | ICD-10-CM | POA: Insufficient documentation

## 2015-05-16 DIAGNOSIS — C649 Malignant neoplasm of unspecified kidney, except renal pelvis: Secondary | ICD-10-CM | POA: Insufficient documentation

## 2015-05-16 DIAGNOSIS — K8689 Other specified diseases of pancreas: Secondary | ICD-10-CM

## 2015-05-16 DIAGNOSIS — Z881 Allergy status to other antibiotic agents status: Secondary | ICD-10-CM | POA: Diagnosis not present

## 2015-05-16 DIAGNOSIS — G893 Neoplasm related pain (acute) (chronic): Secondary | ICD-10-CM | POA: Insufficient documentation

## 2015-05-16 DIAGNOSIS — C7800 Secondary malignant neoplasm of unspecified lung: Secondary | ICD-10-CM

## 2015-05-16 DIAGNOSIS — M5489 Other dorsalgia: Secondary | ICD-10-CM | POA: Diagnosis present

## 2015-05-16 HISTORY — PX: PAIN PUMP IMPLANTATION: SHX330

## 2015-05-16 LAB — COMPREHENSIVE METABOLIC PANEL (CC13)
ALT: 33 U/L (ref 0–55)
AST: 21 U/L (ref 5–34)
Albumin: 3.7 g/dL (ref 3.5–5.0)
Alkaline Phosphatase: 96 U/L (ref 40–150)
Anion Gap: 9 mEq/L (ref 3–11)
BUN: 9.5 mg/dL (ref 7.0–26.0)
CO2: 27 mEq/L (ref 22–29)
Calcium: 9.6 mg/dL (ref 8.4–10.4)
Chloride: 104 mEq/L (ref 98–109)
Creatinine: 1.2 mg/dL (ref 0.7–1.3)
EGFR: 74 mL/min/{1.73_m2} — ABNORMAL LOW (ref >90)
Glucose: 163 mg/dl — ABNORMAL HIGH (ref 70–140)
Potassium: 4.6 mEq/L (ref 3.5–5.1)
Sodium: 141 mEq/L (ref 136–145)
Total Bilirubin: 0.41 mg/dL (ref 0.20–1.20)
Total Protein: 7.6 g/dL (ref 6.4–8.3)

## 2015-05-16 LAB — CBC WITH DIFFERENTIAL/PLATELET
BASO%: 0.3 % (ref 0.0–2.0)
Basophils Absolute: 0 10*3/uL (ref 0.0–0.1)
EOS%: 2.7 % (ref 0.0–7.0)
Eosinophils Absolute: 0.2 10*3/uL (ref 0.0–0.5)
HCT: 47.2 % (ref 38.4–49.9)
HGB: 15.6 g/dL (ref 13.0–17.1)
LYMPH%: 20.5 % (ref 14.0–49.0)
MCH: 36.4 pg — ABNORMAL HIGH (ref 27.2–33.4)
MCHC: 33.1 g/dL (ref 32.0–36.0)
MCV: 110.3 fL — ABNORMAL HIGH (ref 79.3–98.0)
MONO#: 0.5 10*3/uL (ref 0.1–0.9)
MONO%: 6.9 % (ref 0.0–14.0)
NEUT#: 4.9 10*3/uL (ref 1.5–6.5)
NEUT%: 69.6 % (ref 39.0–75.0)
Platelets: 194 10*3/uL (ref 140–400)
RBC: 4.28 10*6/uL (ref 4.20–5.82)
RDW: 15.4 % — ABNORMAL HIGH (ref 11.0–14.6)
WBC: 7 10*3/uL (ref 4.0–10.3)
lymph#: 1.4 10*3/uL (ref 0.9–3.3)

## 2015-05-16 SURGERY — PAIN PUMP INSERTION
Anesthesia: General

## 2015-05-16 MED ORDER — PHENYLEPHRINE 40 MCG/ML (10ML) SYRINGE FOR IV PUSH (FOR BLOOD PRESSURE SUPPORT)
PREFILLED_SYRINGE | INTRAVENOUS | Status: AC
  Filled 2015-05-16: qty 10

## 2015-05-16 MED ORDER — FENTANYL CITRATE (PF) 100 MCG/2ML IJ SOLN
INTRAMUSCULAR | Status: DC | PRN
  Administered 2015-05-16: 100 ug via INTRAVENOUS
  Administered 2015-05-16 (×4): 50 ug via INTRAVENOUS
  Administered 2015-05-16: 100 ug via INTRAVENOUS

## 2015-05-16 MED ORDER — PROPOFOL 10 MG/ML IV BOLUS
INTRAVENOUS | Status: AC
  Filled 2015-05-16: qty 20

## 2015-05-16 MED ORDER — ONDANSETRON HCL 4 MG/2ML IJ SOLN: 4.0000 mg | Freq: Once | INTRAMUSCULAR | Status: DC | PRN

## 2015-05-16 MED ORDER — FENTANYL CITRATE (PF) 250 MCG/5ML IJ SOLN
INTRAMUSCULAR | Status: AC
Start: 2015-05-16 — End: 2015-05-16
  Filled 2015-05-16: qty 5

## 2015-05-16 MED ORDER — ARTIFICIAL TEARS OP OINT
TOPICAL_OINTMENT | OPHTHALMIC | Status: DC | PRN
  Administered 2015-05-16: 1 via OPHTHALMIC

## 2015-05-16 MED ORDER — FENTANYL CITRATE (PF) 100 MCG/2ML IJ SOLN: 25.0000 ug | INTRAMUSCULAR | Status: DC | PRN

## 2015-05-16 MED ORDER — GLYCOPYRROLATE 0.2 MG/ML IJ SOLN
INTRAMUSCULAR | Status: DC | PRN
  Administered 2015-05-16: 0.4 mg via INTRAVENOUS

## 2015-05-16 MED ORDER — ROCURONIUM BROMIDE 100 MG/10ML IV SOLN
INTRAVENOUS | Status: DC | PRN
  Administered 2015-05-16: 40 mg via INTRAVENOUS
  Administered 2015-05-16: 10 mg via INTRAVENOUS

## 2015-05-16 MED ORDER — MIDAZOLAM HCL 5 MG/5ML IJ SOLN
INTRAMUSCULAR | Status: DC | PRN
  Administered 2015-05-16: 2 mg via INTRAVENOUS

## 2015-05-16 MED ORDER — BUPIVACAINE-EPINEPHRINE 0.5% -1:200000 IJ SOLN
INTRAMUSCULAR | Status: DC | PRN
  Administered 2015-05-16: 20 mL

## 2015-05-16 MED ORDER — VANCOMYCIN HCL 1000 MG IV SOLR
1000.0000 mg | INTRAVENOUS | Status: DC | PRN
  Administered 2015-05-16: 1000 mg via INTRAVENOUS

## 2015-05-16 MED ORDER — SODIUM CHLORIDE 0.9 % IV SOLN
INTRAVENOUS | Status: DC | PRN
  Administered 2015-05-16 (×2): 10 mL via INTRAMUSCULAR

## 2015-05-16 MED ORDER — SUCCINYLCHOLINE CHLORIDE 20 MG/ML IJ SOLN
INTRAMUSCULAR | Status: AC
  Filled 2015-05-16: qty 2

## 2015-05-16 MED ORDER — LIDOCAINE HCL (CARDIAC) 20 MG/ML IV SOLN
INTRAVENOUS | Status: DC | PRN
  Administered 2015-05-16: 60 mg via INTRAVENOUS

## 2015-05-16 MED ORDER — CLINDAMYCIN HCL 150 MG PO CAPS: 150.0000 mg | ORAL_CAPSULE | Freq: Three times a day (TID) | ORAL | Status: DC

## 2015-05-16 MED ORDER — ARTIFICIAL TEARS OP OINT
TOPICAL_OINTMENT | OPHTHALMIC | Status: AC
  Filled 2015-05-16: qty 3.5

## 2015-05-16 MED ORDER — PROPOFOL 10 MG/ML IV BOLUS
INTRAVENOUS | Status: DC | PRN
  Administered 2015-05-16: 180 mg via INTRAVENOUS
  Administered 2015-05-16: 50 mg via INTRAVENOUS

## 2015-05-16 MED ORDER — BACITRACIN ZINC 500 UNIT/GM EX OINT
TOPICAL_OINTMENT | CUTANEOUS | Status: DC | PRN
  Administered 2015-05-16: 1 via TOPICAL

## 2015-05-16 MED ORDER — ROCURONIUM BROMIDE 50 MG/5ML IV SOLN
INTRAVENOUS | Status: AC
  Filled 2015-05-16: qty 2

## 2015-05-16 MED ORDER — NONFORMULARY OR COMPOUNDED ITEM
50.0000 mg | Status: DC | PRN
  Filled 2015-05-16: qty 1

## 2015-05-16 MED ORDER — LACTATED RINGERS IV SOLN
INTRAVENOUS | Status: DC
  Administered 2015-05-16 (×4): via INTRAVENOUS

## 2015-05-16 MED ORDER — HYDROMORPHONE HCL 8 MG PO TABS
8.0000 mg | ORAL_TABLET | ORAL | Status: DC | PRN
Start: 2015-05-16 — End: 2015-10-23

## 2015-05-16 MED ORDER — ONDANSETRON HCL 4 MG/2ML IJ SOLN
INTRAMUSCULAR | Status: DC | PRN
  Administered 2015-05-16: 4 mg via INTRAVENOUS

## 2015-05-16 MED ORDER — NEOSTIGMINE METHYLSULFATE 10 MG/10ML IV SOLN
INTRAVENOUS | Status: DC | PRN
  Administered 2015-05-16: 3 mg via INTRAVENOUS

## 2015-05-16 MED ORDER — SODIUM CHLORIDE 0.9 % IR SOLN
Status: DC | PRN
  Administered 2015-05-16: 500 mL

## 2015-05-16 MED ORDER — FENTANYL CITRATE (PF) 250 MCG/5ML IJ SOLN
INTRAMUSCULAR | Status: AC
  Filled 2015-05-16: qty 5

## 2015-05-16 MED ORDER — PHENYLEPHRINE HCL 10 MG/ML IJ SOLN
INTRAMUSCULAR | Status: DC | PRN
  Administered 2015-05-16 (×4): 80 ug via INTRAVENOUS

## 2015-05-16 MED ORDER — MIDAZOLAM HCL 2 MG/2ML IJ SOLN
INTRAMUSCULAR | Status: AC
  Filled 2015-05-16: qty 2

## 2015-05-16 SURGICAL SUPPLY — 73 items
ANTNA NRSTM XTRN TELEM NS LF (UROLOGICAL SUPPLIES) ×1
BAG DECANTER FOR FLEXI CONT (MISCELLANEOUS) ×2 IMPLANT
BINDER ABD UNIV 12 45-62 (WOUND CARE) ×1 IMPLANT
BINDER ABDOMINAL 46IN 62IN (WOUND CARE) ×2
BLADE CLIPPER SURG (BLADE) ×1 IMPLANT
BOOT SUTURE AID YELLOW STND (SUTURE) ×2 IMPLANT
CATH ASCENDA 1PIECE (Catheter) ×1 IMPLANT
CHLORAPREP W/TINT 26ML (MISCELLANEOUS) ×2 IMPLANT
CONT SPEC 4OZ CLIKSEAL STRL BL (MISCELLANEOUS) ×2 IMPLANT
DRAPE C-ARM 42X72 X-RAY (DRAPES) ×5 IMPLANT
DRAPE ORTHO SPLIT 77X108 STRL (DRAPES) ×4
DRAPE POUCH INSTRU U-SHP 10X18 (DRAPES) ×2 IMPLANT
DRAPE SURG 17X23 STRL (DRAPES) ×8 IMPLANT
DRAPE SURG ORHT 6 SPLT 77X108 (DRAPES) IMPLANT
DRSG COVADERM 4X8 (GAUZE/BANDAGES/DRESSINGS) ×1 IMPLANT
DRSG OPSITE POSTOP 4X6 (GAUZE/BANDAGES/DRESSINGS) ×2 IMPLANT
DRSG TELFA 3X8 NADH (GAUZE/BANDAGES/DRESSINGS) IMPLANT
ELECT REM PT RETURN 9FT ADLT (ELECTROSURGICAL) ×2
ELECTRODE REM PT RTRN 9FT ADLT (ELECTROSURGICAL) ×1 IMPLANT
GAUZE SPONGE 4X4 16PLY XRAY LF (GAUZE/BANDAGES/DRESSINGS) IMPLANT
GLOVE BIOGEL PI IND STRL 7.5 (GLOVE) IMPLANT
GLOVE BIOGEL PI INDICATOR 7.5 (GLOVE) ×3
GLOVE ECLIPSE 7.5 STRL STRAW (GLOVE) ×3 IMPLANT
GLOVE EXAM NITRILE LRG STRL (GLOVE) IMPLANT
GLOVE EXAM NITRILE MD LF STRL (GLOVE) IMPLANT
GLOVE EXAM NITRILE XL STR (GLOVE) IMPLANT
GLOVE EXAM NITRILE XS STR PU (GLOVE) IMPLANT
GLOVE SURG SS PI 7.0 STRL IVOR (GLOVE) ×1 IMPLANT
GOWN STRL REUS W/ TWL LRG LVL3 (GOWN DISPOSABLE) IMPLANT
GOWN STRL REUS W/ TWL XL LVL3 (GOWN DISPOSABLE) IMPLANT
GOWN STRL REUS W/TWL 2XL LVL3 (GOWN DISPOSABLE) IMPLANT
GOWN STRL REUS W/TWL LRG LVL3 (GOWN DISPOSABLE) ×2
GOWN STRL REUS W/TWL XL LVL3 (GOWN DISPOSABLE) ×2
KIT BASIN OR (CUSTOM PROCEDURE TRAY) ×2 IMPLANT
KIT REFILL (MISCELLANEOUS) ×1
KIT REFILL CATH SYNCHROMED II (MISCELLANEOUS) IMPLANT
KIT ROOM TURNOVER OR (KITS) ×2 IMPLANT
LIQUID BAND (GAUZE/BANDAGES/DRESSINGS) IMPLANT
MANAGER PERSONAL THERAPY ×1 IMPLANT
NDL 18GX1X1/2 (RX/OR ONLY) (NEEDLE) IMPLANT
NDL HYPO 18GX1.5 BLUNT FILL (NEEDLE) IMPLANT
NDL HYPO 25X1 1.5 SAFETY (NEEDLE) ×1 IMPLANT
NEEDLE 18GX1X1/2 (RX/OR ONLY) (NEEDLE) IMPLANT
NEEDLE HYPO 18GX1.5 BLUNT FILL (NEEDLE) ×2 IMPLANT
NEEDLE HYPO 25X1 1.5 SAFETY (NEEDLE) ×2 IMPLANT
NS IRRIG 1000ML POUR BTL (IV SOLUTION) ×2 IMPLANT
PACK LAMINECTOMY NEURO (CUSTOM PROCEDURE TRAY) ×1 IMPLANT
PAD ARMBOARD 7.5X6 YLW CONV (MISCELLANEOUS) ×2 IMPLANT
PAD DRESSING TELFA 3X8 NADH (GAUZE/BANDAGES/DRESSINGS) IMPLANT
PASSER CATH 38CM DISP (CATHETERS) ×1 IMPLANT
PERSONAL THERAPY MANAGER ×1 IMPLANT
POUCH TYRX ANTIBAC NEURO LRG (Mesh General) IMPLANT
POUCH TYRX NEURO LRG (Mesh General) ×2 IMPLANT
PROGRAMMER ANTENNA EXT (UROLOGICAL SUPPLIES) ×1 IMPLANT
PUMP SYNCHROMED II 40ML RESVR (Neuro Prosthesis/Implant) ×1 IMPLANT
SPONGE LAP 4X18 X RAY DECT (DISPOSABLE) IMPLANT
SPONGE SURGIFOAM ABS GEL SZ50 (HEMOSTASIS) IMPLANT
STAPLER SKIN PROX WIDE 3.9 (STAPLE) ×2 IMPLANT
SUT MNCRL AB 4-0 PS2 18 (SUTURE) ×2 IMPLANT
SUT SILK 0 (SUTURE) ×2
SUT SILK 0 MO-6 18XCR BRD 8 (SUTURE) IMPLANT
SUT SILK 0 TIES 10X30 (SUTURE) IMPLANT
SUT SILK 2 0 FS (SUTURE) ×3 IMPLANT
SUT SILK 2 0 TIES 10X30 (SUTURE) IMPLANT
SUT VIC AB 2-0 CP2 18 (SUTURE) ×4 IMPLANT
SYR 20CC LL (SYRINGE) ×1 IMPLANT
SYR 20ML ECCENTRIC (SYRINGE) ×1 IMPLANT
SYR 3ML LL SCALE MARK (SYRINGE) ×1 IMPLANT
SYRINGE 10CC LL (SYRINGE) IMPLANT
TOWEL OR 17X24 6PK STRL BLUE (TOWEL DISPOSABLE) ×2 IMPLANT
TOWEL OR 17X26 10 PK STRL BLUE (TOWEL DISPOSABLE) ×2 IMPLANT
WATER STERILE IRR 1000ML POUR (IV SOLUTION) ×2 IMPLANT
YANKAUER SUCT BULB TIP NO VENT (SUCTIONS) ×2 IMPLANT

## 2015-05-16 NOTE — Anesthesia Procedure Notes (Signed)
Procedure Name: Intubation Date/Time: 05/16/2015 3:32 PM Performed by: Izora Gala Pre-anesthesia Checklist: Patient identified, Emergency Drugs available, Suction available and Patient being monitored Patient Re-evaluated:Patient Re-evaluated prior to inductionOxygen Delivery Method: Circle system utilized Preoxygenation: Pre-oxygenation with 100% oxygen Intubation Type: IV induction Ventilation: Mask ventilation without difficulty Laryngoscope Size: Miller and 3 Grade View: Grade I Tube type: Oral Tube size: 7.5 mm Number of attempts: 1 Airway Equipment and Method: Stylet and LTA kit utilized Placement Confirmation: ETT inserted through vocal cords under direct vision,  positive ETCO2 and breath sounds checked- equal and bilateral Secured at: 22 cm Tube secured with: Tape Dental Injury: Teeth and Oropharynx as per pre-operative assessment

## 2015-05-16 NOTE — Anesthesia Postprocedure Evaluation (Signed)
  Anesthesia Post-op Note  Patient: Keith [name]  Procedure(s) Performed: Procedure(s) (LRB): Intrathecal pain pump placement (N/A)  Patient Location: PACU  Anesthesia Type: General  Level of Consciousness: awake and alert   Airway and Oxygen Therapy: Patient Spontanous Breathing  Post-op Pain: mild  Post-op Assessment: Post-op Vital signs reviewed, Patient's Cardiovascular Status Stable, Respiratory Function Stable, Patent Airway and No signs of Nausea or vomiting  Last Vitals:  Filed Vitals:   05/16/15 1208  BP: 139/92  Pulse: 79  Temp: 36.5 C  Resp: 18    Post-op Vital Signs: stable   Complications: No apparent anesthesia complications

## 2015-05-16 NOTE — Progress Notes (Signed)
I faxed app to [employer] for asst with votrient.  [phone]

## 2015-05-16 NOTE — Op Note (Signed)
PREOP DX: 1)metastatic renal cell carcinoma 2) cancer related pain POSTOP DX:1) metastatic renal cell carcinoma 2) cancer related pain  PROCEDURES: 1) implantation of IT catheter 2) implantation of IT pump 4) IT pump programming  SURGEON: Mylene Bow ASSISTANT: none ANESTHESIA: GETA  EBL: <20cc  DESCRIPTION OF PROCEDURE: After a discussion of risks, benefits and alternatives, informed consent was obtained. The patient was taken to the OR, a plane of general anesthesia induced, and the patient intubated by the anesthesiology staff. The patient was then positioned in a left lateral decubitus position on the OR table, an axillary roll placed, and extreme care taken to position the patient so that all pressure points were padded, limbs and head all in a neutral position. The patient's abdomen, flank and lumbar spine were prepped with betadine scrub and chloraprep, and draped into a sterile field. A timeout was taken to verify the appropriate patient, position, procedure, availability of equipment and personnel, and administration of perioperative antibiotics.   A lumbar incision was planned at the L1-2 level with the use of the fluoroscope. The skin was incised with a 10 blade, and careful sharp dissection carried down to the dorsolumbar fascia with the bovie electrocautery. A 14-gauge Touhy needle was then introduced and advanced under fluoroscopic guidance with a gradual approach to the L1-2 interspace. When clear CSF was obtained then a Medtronic intrathecal catheter was introduced and seen to advance into the posterior elements of the spinal space. Again with the use of the fluoroscope as guidance, the catheter was advanced to its distal end was at the level of the T7 vertebral body. Rubber-shod clamp was placed at the end of the catheter, and an 0 silk suture was placed in a pursestring type fashion about the needle. The needle was then packed off the catheter, and the pursestring tied to prevent  further CSF extravasation. Good flow of CSF was noted from the distal end of the catheter. Fixation device was then threaded over the catheter, and then used to affix the catheter to the dorsolumbar fascia again using the same previously placed pursestring suture.   Attention was then turned to right lower abdominal quadrant, and the creation of a subcutaneous pocket for pump implantation. An incision was made, and the subcutaneous pocket created with blunt dissection. The pocket was carefully inspected, and hemostasis was assured. A reverse Seldinger technique was then utilized to pass the intrathecal catheter to the pump pocket.   During the creation of the subcutaneous pocket, on the back table  Sterile preservative free saline was placed in the pump to prepare it. We had ordered 1 mg/mL morphine, preservative free, but what was supplied contained preservative. We thus utilized NS. The catheter itself was then attached to a side port assembly, and then attached to the side port of the pump itself. CSF flow was maintained throughout all steps, andfrom the site aspiration port itself at the conclusion of all the connections being made.   With now a functional pump/catheter assembly, the pocket was re-inspected and found to be hemostatic. 0 silk sutures were placed in the medial and lateral corners of the incision, and another interrupted 2--0 silk suture placed in the inferolateral aspect of the pocket. The residual catheter was carefully coiled on the posterior face of the pump, and these sutures were then placed through the retention angles of the pump itself, with care taken to not incorporate or tie off the catheter into the sutures when they were tied; the pump was fixed into place  with the sutures in an orientation with the side port directed cephalad, or 12 o'clock position.   The pocket was then copiously irrigated with bacitracin-containing irrigation, and the incision closed with 2 layers of  2-0 interrupted vicryl sutures, and the skin closed with staples. Bacitracin ointment and a sterile, occlusive dressing were placed. Needle, instrument and sponge counts were correct x2 at the end of the case.   The pump was re-programmed then to indicate the saline, running at a small basal rate. The patient was turned onto a stretcher, extubated by the anesthesiology team, and taken to PACU in stable condition.   DRAINS: none  COMPLICATIONS: none CONDITION: stable throughout, and to the PACU  DISPOSITION:discharge to home/ Pt. Has pain medication. He will follow up with me in 2 weeks for a wound check and removal of staples if appropriate. Case discussed with the patient's mother at patient's request.

## 2015-05-16 NOTE — Discharge Instructions (Signed)
Dr. Angla Delahunt Post-Op Orders ° °• Ice Pack - 20 minutes on (in a pillow case), and 20 minutes off. Wear the ice pack UNDER the binder. °• Follow up in office, they will call you for an appointment in 10 days to 2 weeks. °• Increase activity gradually.   °• No lifting anything heavier than a gallon of milk (10 pounds) until seen in the office. °• Advance diet slowly as tolerated. °• Dressing care:  Keep dressing dry for 3 days, and on Post-op day 4, may shower. °• Call for fever, drainage, and redness. °• No swimming or bathing in a bathtub (do not get into standing water). °•  °

## 2015-05-16 NOTE — Telephone Encounter (Signed)
Pt confirmed labs/ov per 05/25 POF, gave pt AVS and Calendar.... KJ

## 2015-05-16 NOTE — Transfer of Care (Signed)
Immediate Anesthesia Transfer of Care Note  Patient: Keith [name]  Procedure(s) Performed: Procedure(s) with comments: Intrathecal pain pump placement (N/A) - Intrathecal pain pump placement  Patient Location: PACU  Anesthesia Type:General  Level of Consciousness: awake, alert , oriented and patient cooperative  Airway & Oxygen Therapy: Patient Spontanous Breathing and Patient connected to nasal cannula oxygen  Post-op Assessment: Report given to RN, Post -op Vital signs reviewed and stable, Patient moving all extremities and Patient moving all extremities X 4  Post vital signs: Reviewed and stable  Last Vitals:  Filed Vitals:   05/16/15 1208  BP: 139/92  Pulse: 79  Temp: 36.5 C  Resp: 18    Complications: No apparent anesthesia complications

## 2015-05-16 NOTE — Anesthesia Preprocedure Evaluation (Addendum)
Anesthesia Evaluation  Patient identified by MRN, date of birth, ID band Patient awake    Reviewed: Allergy & Precautions, NPO status , Patient's Chart, lab work & pertinent test results  History of Anesthesia Complications Negative for: history of anesthetic complications  Airway Mallampati: II  TM Distance: >3 FB Neck ROM: Full    Dental no notable dental hx. (+) Dental Advisory Given, Edentulous Upper, Edentulous Lower, Lower Dentures, Upper Dentures   Pulmonary Current Smoker,  breath sounds clear to auscultation  Pulmonary exam normal       Cardiovascular hypertension, Pt. on medications Normal cardiovascular examRhythm:Regular Rate:Normal     Neuro/Psych Seizures -,  PSYCHIATRIC DISORDERS Anxiety Depression    GI/Hepatic Neg liver ROS, GERD-  Controlled and Medicated,  Endo/Other  negative endocrine ROS  Renal/GU Renal diseaseRCC w/ mets to lungs and pancreas  negative genitourinary   Musculoskeletal negative musculoskeletal ROS (+)   Abdominal   Peds negative pediatric ROS (+)  Hematology negative hematology ROS (+)   Anesthesia Other Findings   Reproductive/Obstetrics negative OB ROS                            Anesthesia Physical Anesthesia Plan  ASA: III  Anesthesia Plan: General   Post-op Pain Management:    Induction: Intravenous  Airway Management Planned: Oral ETT  Additional Equipment:   Intra-op Plan:   Post-operative Plan: Extubation in OR  Informed Consent: I have reviewed the patients History and Physical, chart, labs and discussed the procedure including the risks, benefits and alternatives for the proposed anesthesia with the patient or authorized representative who has indicated his/her understanding and acceptance.   Dental advisory given  Plan Discussed with: CRNA  Anesthesia Plan Comments:         Anesthesia Quick Evaluation

## 2015-05-16 NOTE — Progress Notes (Signed)
Hematology and Oncology Follow Up Visit  Keith [name] [id number] 1966-10-31 49 y.o. 05/16/2015 8:36 AM  Keith Bring, MD  [name], M.D.  [name], MD  [name], MD    Principle Diagnosis: This is a 49 year old gentleman with stage IV renal cell carcinoma diagnosed in 2009.  Prior Therapy: 1. Status post laparoscopic radical nephrectomy.  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  2. Patient status post thoracotomy for a synchronous metastatic lung lesions done October 2009.  He had a lower lobe nodule, biopsy proven to be metastatic renal cell carcinoma.   3. Patient is status post stereotactic radiotherapy to pulmonary nodules in May of 2010. 4. He is S/P Sutent 50 mg 4 weeks on 2 weeks off from 10/2010 to 03/2013. He progressed at that time.  5. He is S/P radiation to the right sacral bone between 4/22 to 4/30.  6. He is S/P XRT to the left shoulder 03/20/14 to 03/31/14.   Current therapy: He is on Votrient 800 mg since 03/2013. His disease includes lung lesions, pancreatic as well as right sacral lesion.   Interim History:  Mr. Want presents today for a followup visit. Since his last visit, he reports doing relatively well. He continues to tolerate his Votrient without significant difficulty. He does have occasional loose bowel habits but fairly manageable. Has not reported any other GI complications. They little fatigue is noted. He complains of increased back and right leg pain and scheduled to have a pain pump inserted in the near future.  He is functioning well and is able to eat much better. He is ambulating without any overt lower extremity weakness difficulties and has not reported any neurological deficits. He did not report any new complications or illnesses from Votrient.  He has not reported any other episodes of nausea or vomiting. He is not reporting any constipation. Has not reported any recent hospitalizations or illnesses.  He did not report any headaches blurred vision or double vision. Does not report any seizure activity or alteration of mental status. Is not reporting any chest pain or difficulty breathing. Is not reporting any cough or hemoptysis. Does not report any abdominal pain or hematochezia. Does not report any hematuria but does report occasional hesitancy and nocturia. He does not report any rashes or lesions or petechiae. He does not report any lymphadenopathy. His performance status and activity level remain relatively stable at this time. Remainder of his review of system is unremarkable.  Medications: I have reviewed the patient's current medications.  Current Outpatient Prescriptions  Medication Sig Dispense Refill  . ALPRAZolam (XANAX) 1 MG tablet TAKE 1 TABLET EVERY 8 HOURS AS NEEDED FOR ANXIETY 100 tablet 0  . DULoxetine (CYMBALTA) 60 MG capsule TAKE 1 CAPSULE TWICE DAILY 60 capsule 2  . gabapentin (NEURONTIN) 300 MG capsule Take 1 capsule (300 mg total) by mouth 3 (three) times daily. 90 capsule 3  . HYDROmorphone (DILAUDID) 4 MG tablet Take 1 tablet (4 mg total) by mouth every 4 (four) hours as needed for severe pain. 30 tablet 0  . ibuprofen (ADVIL,MOTRIN) 800 MG tablet Take 800 mg by mouth every 8 (eight) hours as needed.    . pazopanib (VOTRIENT) 200 MG tablet Take 4 tablets (800 mg total) by mouth every morning. Take on an empty stomach. 120 tablet 0  . tiZANidine (ZANAFLEX) 4 MG tablet Take 4 mg by mouth every 8 (eight) hours as needed for muscle spasms.  0  . VIAGRA 100 MG tablet Take 50 mg by mouth as needed for erectile dysfunction.      No current facility-administered medications for this visit.   Facility-Administered Medications Ordered in Other Visits  Medication Dose Route Frequency Provider Last Rate Last Dose  . vancomycin (VANCOCIN) IVPB 1000 mg/200 mL premix  1,000 mg Intravenous To SS-Surg Clydell Hakim, MD         Allergies:  Allergies  Allergen Reactions  .  Ceftriaxone Hives  . Hydrocodone Swelling    Past Medical History, Surgical history, Social history, and Family History were reviewed and updated.   Physical Exam: Blood pressure 136/96, pulse 71, resp. rate 16, SpO2 100 %. ECOG: 1 General appearance: alert and awake not in any distress. Head: Normocephalic, without obvious abnormality.  Neck: no adenopathy, no masses.  Lymph nodes: Cervical, supraclavicular, and axillary nodes normal. Heart:regular rate and rhythm, S1, S2.  Lung:chest clear, no wheezing, rales, normal symmetric air entry. No dullness to percussion.  Abdomen: soft, non-tender, without masses or organomegaly EXT:no edema, no despumation. No erythema noted.   Neuro: no focal deficits noted. No weakness noted in his lower extremities.  Lab Results: Lab Results  Component Value Date   WBC 7.0 05/16/2015   HGB 15.6 05/16/2015   HCT 47.2 05/16/2015   MCV 110.3* 05/16/2015   PLT 194 05/16/2015     Impression and Plan:  This is a pleasant 50 year old gentleman with the following issues. 1. Metastatic renal cell carcinoma.  He has documented disease to the lung and the pancreas.  He tolerating Votrient without complications. CT scan from 12/2014  showed mostly stable disease. The plan is to continue with the current dose and schedule without any modification. I will repeat his CT scan and the end of June for the next visit. Different salvage regimens can be used if he progresses. 2. Pancreatic thickening/mass.  This has been biopsy proven to be a renal cell metastasis. No change in treatment at this time.  3. Depression/Anxiety: He remains on Xanax and Cymbalta. His mood continues to be excellent. 4. Weight loss: This has resolved at this time. 5. Sacral mass: S/P radiation to that area and recently received an injection into that area. He is reporting much improved pain control at this time. He is scheduled to have a pain pump inserted in the near future which will help  as well. 6. Neuropathic pain. He is on gabapentin and Dilaudid for pain.  7. Hypertension: Controlled at this time on Votrient. 8. Follow up: One month.  Zola Button, MD
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
## 2015-05-18 ENCOUNTER — Encounter: Payer: Self-pay | Admitting: Oncology

## 2015-05-18 NOTE — Progress Notes (Signed)
Per [employer] patient has been approved for votrient asst until 05/15/16

## 2015-05-22 ENCOUNTER — Encounter (HOSPITAL_COMMUNITY): Payer: Self-pay | Admitting: Anesthesiology

## 2015-05-24 ENCOUNTER — Telehealth: Payer: Self-pay | Admitting: *Deleted

## 2015-05-24 ENCOUNTER — Other Ambulatory Visit: Payer: Self-pay | Admitting: Oncology

## 2015-05-24 NOTE — Telephone Encounter (Signed)
This RN spoke with patient's wife and informed her that his prescription for Xanax is ready for pick up. Wife verbalized understanding.

## 2015-05-25 ENCOUNTER — Encounter (HOSPITAL_COMMUNITY): Payer: Self-pay | Admitting: Anesthesiology

## 2015-05-25 ENCOUNTER — Other Ambulatory Visit: Payer: Self-pay | Admitting: Oncology

## 2015-06-07 ENCOUNTER — Emergency Department (HOSPITAL_COMMUNITY)
Admission: EM | Admit: 2015-06-07 | Discharge: 2015-06-07 | Disposition: A | Discharge status: Home / Self Care | Payer: 59 | Attending: Emergency Medicine | Admitting: Emergency Medicine

## 2015-06-07 ENCOUNTER — Encounter (HOSPITAL_COMMUNITY): Payer: Self-pay

## 2015-06-07 DIAGNOSIS — R1032 Left lower quadrant pain: Secondary | ICD-10-CM | POA: Diagnosis present

## 2015-06-07 DIAGNOSIS — I10 Essential (primary) hypertension: Secondary | ICD-10-CM | POA: Insufficient documentation

## 2015-06-07 DIAGNOSIS — Z85528 Personal history of other malignant neoplasm of kidney: Secondary | ICD-10-CM | POA: Diagnosis not present

## 2015-06-07 DIAGNOSIS — Z85118 Personal history of other malignant neoplasm of bronchus and lung: Secondary | ICD-10-CM | POA: Diagnosis not present

## 2015-06-07 DIAGNOSIS — G709 Myoneural disorder, unspecified: Secondary | ICD-10-CM | POA: Diagnosis not present

## 2015-06-07 DIAGNOSIS — F419 Anxiety disorder, unspecified: Secondary | ICD-10-CM | POA: Insufficient documentation

## 2015-06-07 DIAGNOSIS — Z792 Long term (current) use of antibiotics: Secondary | ICD-10-CM | POA: Insufficient documentation

## 2015-06-07 DIAGNOSIS — C7951 Secondary malignant neoplasm of bone: Secondary | ICD-10-CM | POA: Diagnosis not present

## 2015-06-07 DIAGNOSIS — Z79899 Other long term (current) drug therapy: Secondary | ICD-10-CM | POA: Diagnosis not present

## 2015-06-07 DIAGNOSIS — Z8719 Personal history of other diseases of the digestive system: Secondary | ICD-10-CM | POA: Insufficient documentation

## 2015-06-07 DIAGNOSIS — C7889 Secondary malignant neoplasm of other digestive organs: Secondary | ICD-10-CM | POA: Diagnosis not present

## 2015-06-07 DIAGNOSIS — Z720 Tobacco use: Secondary | ICD-10-CM | POA: Diagnosis not present

## 2015-06-07 LAB — URINALYSIS, ROUTINE W REFLEX MICROSCOPIC
Bilirubin Urine: NEGATIVE
Glucose, UA: NEGATIVE mg/dL
Hgb urine dipstick: NEGATIVE
Ketones, ur: NEGATIVE mg/dL
Leukocytes, UA: NEGATIVE
Nitrite: NEGATIVE
Protein, ur: NEGATIVE mg/dL
Specific Gravity, Urine: 1.018 (ref 1.005–1.030)
Urobilinogen, UA: 0.2 mg/dL (ref 0.0–1.0)
pH: 5.5 (ref 5.0–8.0)

## 2015-06-07 LAB — CBC WITH DIFFERENTIAL/PLATELET
Basophils Absolute: 0 10*3/uL (ref 0.0–0.1)
Basophils Relative: 0 % (ref 0–1)
Eosinophils Absolute: 0.2 10*3/uL (ref 0.0–0.7)
Eosinophils Relative: 3 % (ref 0–5)
HCT: 41 % (ref 39.0–52.0)
Hemoglobin: 13.7 g/dL (ref 13.0–17.0)
Lymphocytes Relative: 25 % (ref 12–46)
Lymphs Abs: 1.8 10*3/uL (ref 0.7–4.0)
MCH: 35.8 pg — ABNORMAL HIGH (ref 26.0–34.0)
MCHC: 33.4 g/dL (ref 30.0–36.0)
MCV: 107 fL — ABNORMAL HIGH (ref 78.0–100.0)
Monocytes Absolute: 0.5 10*3/uL (ref 0.1–1.0)
Monocytes Relative: 7 % (ref 3–12)
Neutro Abs: 4.8 10*3/uL (ref 1.7–7.7)
Neutrophils Relative %: 65 % (ref 43–77)
Platelets: 188 10*3/uL (ref 150–400)
RBC: 3.83 MIL/uL — ABNORMAL LOW (ref 4.22–5.81)
RDW: 14.1 % (ref 11.5–15.5)
WBC: 7.3 10*3/uL (ref 4.0–10.5)

## 2015-06-07 LAB — COMPREHENSIVE METABOLIC PANEL
ALT: 22 U/L (ref 17–63)
AST: 18 U/L (ref 15–41)
Albumin: 3.7 g/dL (ref 3.5–5.0)
Alkaline Phosphatase: 90 U/L (ref 38–126)
Anion gap: 8 (ref 5–15)
BUN: 8 mg/dL (ref 6–20)
CO2: 28 mmol/L (ref 22–32)
Calcium: 8.9 mg/dL (ref 8.9–10.3)
Chloride: 101 mmol/L (ref 101–111)
Creatinine, Ser: 1.13 mg/dL (ref 0.61–1.24)
GFR calc Af Amer: >60 mL/min (ref >60)
GFR calc non Af Amer: >60 mL/min (ref >60)
Glucose, Bld: 136 mg/dL — ABNORMAL HIGH (ref 65–99)
Potassium: 4.2 mmol/L (ref 3.5–5.1)
Sodium: 137 mmol/L (ref 135–145)
Total Bilirubin: 0.3 mg/dL (ref 0.3–1.2)
Total Protein: 6.9 g/dL (ref 6.5–8.1)

## 2015-06-07 LAB — TROPONIN I: Troponin I: <0.03 ng/mL (ref <0.031)

## 2015-06-07 LAB — LIPASE, BLOOD: Lipase: 19 U/L — ABNORMAL LOW (ref 22–51)

## 2015-06-07 MED ORDER — HYDROMORPHONE HCL 1 MG/ML IJ SOLN
1.0000 mg | Freq: Once | INTRAMUSCULAR | Status: AC
  Administered 2015-06-07: 1 mg via INTRAVENOUS
  Filled 2015-06-07: qty 1

## 2015-06-07 MED ORDER — HYDROMORPHONE HCL 1 MG/ML IJ SOLN
1.0000 mg | Freq: Once | INTRAMUSCULAR | Status: AC
Start: 2015-06-07 — End: 2015-06-07
  Administered 2015-06-07: 1 mg via INTRAVENOUS
  Filled 2015-06-07: qty 1

## 2015-06-07 MED ORDER — KETOROLAC TROMETHAMINE 30 MG/ML IJ SOLN
30.0000 mg | Freq: Once | INTRAMUSCULAR | Status: AC
  Administered 2015-06-07: 30 mg via INTRAVENOUS
  Filled 2015-06-07: qty 1

## 2015-06-07 NOTE — ED Notes (Signed)
Bed: [facility]77 Expected date: 06/07/15 Expected time:  Means of arrival:  Comments: Triage 3

## 2015-06-07 NOTE — ED Notes (Addendum)
Patient reports lower abdominal pain that radiates down bilateral legs.  Patient has stage IV renal cell carcinoma (he takes PO chemotherapy drugs).  Pain pump was placed in his abdomen last month, but excrutiating pain began today.

## 2015-06-07 NOTE — ED Notes (Addendum)
Pt attempting to urinate, refused in and out cath. PA made aware.

## 2015-06-07 NOTE — Discharge Instructions (Signed)
Bone Metastases  Cancerous growths can begin in any part of the body. The original site of cancer is called the primary tumor or primary cancer (for example, breast cancer). After cancer has developed in one area of the body, cancerous cells from that area can break away and travel through the body's bloodstream. If these cancerous cells begin growing in another place in the body, they are called metastases. Bone metastases are cancer cells that have spread to the bone (which is different from a cancer that starts in the bone).  These secondary growths are like the original tumor. For example, if a prostate cancer spreads to bone it is called metastatic prostate cancer, or prostate cancer metastatic to bone, but not bone cancer. Cancers can spread to almost any bone; the spine and pelvis are often involved.   Any type of cancer can spread to the bone, but the most common are breast, lung, kidney, thyroid and prostate cancers. Sometimes the primary tumor is not discovered until there are bone problems. If the primary cancer location cannot be discovered, the cancer is called cancer of unknown primary location.  SYMPTOMS   Pain in the bones is the main symptom of bone metastases. Some other problems may occur first including:  · Decreased appetite.  · Nausea.  · Muscle weakness.  · Confusion.  · Unusual sleep patterns due to discomfort.  · Overly tired (fatigue).  · Restlessness.  Frail or brittle bones may lead to broken bones (fractures) that lead to learning what is wrong (diagnosis). A tumor often weakens the bones.   DIAGNOSIS   Metastatic cancers may be found months or years after or at the same time as the primary tumor. When a second tumor is found in a patient who has been treated for cancer, it is more often a metastasis than another primary tumor.   The patient's symptoms, physical examination, X-rays and blood tests may suggest a bone metastases. In addition, an examination of tissue or a cell sample  (biopsy) is usually done to find the cancer. This sample is removed with a needle. This tissue sample must be looked at under a microscope to confirm a diagnosis.  TREATMENT   Options generally include treatments that give relief from symptoms (palliative) or curative. Those with advanced, metastasized cancer may receive treatment focused on pain relief and prolonging life. These treatments depend on the type of cancer and its location.   Treatment for cancer depends on its type and location. Some of these treatments are:  · Surgery to remove the original tumor and/or to remove parts of the body that produce hormones and other chemicals that make cancer worse.  · Treatment with drugs (chemotherapy).  · Bone marrow transplantations on rare occasions.  · Radiation therapy (radiotherapy).  · Hormonal therapy.  · Pain relieving medications.  Your caregiver will help you understand the likelihood that any particular treatment will be helpful for you. While some treatments aim to cure or control the cancer, others give relief from symptoms only. If you have bone metastases, radiation therapy may be recommended to treat pain (if it is in one main location). Pain medications are available. These include strong medicines like morphine. You may be instructed to take a long-acting pain medication (to control most of your pain) and a short-acting medication to control occasional flares of pain. Pain medication is sometimes also given continuously through a pump.  HOME CARE INSTRUCTIONS   · Take medications exactly as prescribed.  · Keep any   follow-up appointments.  · Pain medications can make you sleepy or confused. Do not drive, climb ladders, or do other dangerous activities while on pain medication.  · Pain medications often cause constipation. Ask your caregiver for information on stool softeners.  · Do not share your pain medication with others.  SEEK MEDICAL CARE IF:   · Your bone pain is not controlled.  · You are having  problems or side effects from your medication.  · You have excessive sleepiness or confusion.  SEEK IMMEDIATE MEDICAL CARE IF:   · You fall and have any injury or pain from the fall.  · You have trouble walking.  · You have numbness or tingling in your legs.  · You develop a sudden significant worsening of your pain.  Document Released: 11/28/2002 Document Revised: 03/01/2012 Document Reviewed: 07/21/2008  Exit[employer]® Patient Information ©2015 [employer]. This information is not intended to replace advice given to you by your health care provider. Make sure you discuss any questions you have with your health care provider.

## 2015-06-07 NOTE — ED Notes (Signed)
Pt unable to urinate at this time.  
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
## 2015-06-16 ENCOUNTER — Encounter (HOSPITAL_COMMUNITY): Payer: Self-pay | Admitting: Emergency Medicine

## 2015-06-16 ENCOUNTER — Emergency Department (HOSPITAL_COMMUNITY)
Admission: EM | Admit: 2015-06-16 | Discharge: 2015-06-16 | Disposition: A | Discharge status: Home / Self Care | Payer: 59 | Attending: Emergency Medicine | Admitting: Emergency Medicine

## 2015-06-16 DIAGNOSIS — Z905 Acquired absence of kidney: Secondary | ICD-10-CM | POA: Diagnosis not present

## 2015-06-16 DIAGNOSIS — Z8739 Personal history of other diseases of the musculoskeletal system and connective tissue: Secondary | ICD-10-CM | POA: Diagnosis not present

## 2015-06-16 DIAGNOSIS — C7951 Secondary malignant neoplasm of bone: Secondary | ICD-10-CM | POA: Insufficient documentation

## 2015-06-16 DIAGNOSIS — C799 Secondary malignant neoplasm of unspecified site: Secondary | ICD-10-CM

## 2015-06-16 DIAGNOSIS — Z8719 Personal history of other diseases of the digestive system: Secondary | ICD-10-CM | POA: Insufficient documentation

## 2015-06-16 DIAGNOSIS — C7800 Secondary malignant neoplasm of unspecified lung: Secondary | ICD-10-CM | POA: Diagnosis not present

## 2015-06-16 DIAGNOSIS — M25551 Pain in right hip: Secondary | ICD-10-CM | POA: Diagnosis not present

## 2015-06-16 DIAGNOSIS — I10 Essential (primary) hypertension: Secondary | ICD-10-CM | POA: Diagnosis not present

## 2015-06-16 DIAGNOSIS — C7889 Secondary malignant neoplasm of other digestive organs: Secondary | ICD-10-CM | POA: Diagnosis not present

## 2015-06-16 DIAGNOSIS — Z79899 Other long term (current) drug therapy: Secondary | ICD-10-CM | POA: Diagnosis not present

## 2015-06-16 DIAGNOSIS — Z720 Tobacco use: Secondary | ICD-10-CM | POA: Diagnosis not present

## 2015-06-16 DIAGNOSIS — Z85528 Personal history of other malignant neoplasm of kidney: Secondary | ICD-10-CM | POA: Diagnosis not present

## 2015-06-16 LAB — CBC WITH DIFFERENTIAL/PLATELET
Basophils Absolute: 0 10*3/uL (ref 0.0–0.1)
Basophils Relative: 1 % (ref 0–1)
Eosinophils Absolute: 0.3 10*3/uL (ref 0.0–0.7)
Eosinophils Relative: 4 % (ref 0–5)
HCT: 41 % (ref 39.0–52.0)
Hemoglobin: 13.6 g/dL (ref 13.0–17.0)
Lymphocytes Relative: 34 % (ref 12–46)
Lymphs Abs: 2 10*3/uL (ref 0.7–4.0)
MCH: 35.5 pg — ABNORMAL HIGH (ref 26.0–34.0)
MCHC: 33.2 g/dL (ref 30.0–36.0)
MCV: 107 fL — ABNORMAL HIGH (ref 78.0–100.0)
Monocytes Absolute: 0.5 10*3/uL (ref 0.1–1.0)
Monocytes Relative: 8 % (ref 3–12)
Neutro Abs: 3.3 10*3/uL (ref 1.7–7.7)
Neutrophils Relative %: 53 % (ref 43–77)
Platelets: 142 10*3/uL — ABNORMAL LOW (ref 150–400)
RBC: 3.83 MIL/uL — ABNORMAL LOW (ref 4.22–5.81)
RDW: 14 % (ref 11.5–15.5)
WBC: 6 10*3/uL (ref 4.0–10.5)

## 2015-06-16 LAB — BASIC METABOLIC PANEL
Anion gap: 8 (ref 5–15)
BUN: 10 mg/dL (ref 6–20)
CO2: 28 mmol/L (ref 22–32)
Calcium: 8.9 mg/dL (ref 8.9–10.3)
Chloride: 98 mmol/L — ABNORMAL LOW (ref 101–111)
Creatinine, Ser: 1.03 mg/dL (ref 0.61–1.24)
GFR calc Af Amer: >60 mL/min (ref >60)
GFR calc non Af Amer: >60 mL/min (ref >60)
Glucose, Bld: 176 mg/dL — ABNORMAL HIGH (ref 65–99)
Potassium: 3.9 mmol/L (ref 3.5–5.1)
Sodium: 134 mmol/L — ABNORMAL LOW (ref 135–145)

## 2015-06-16 MED ORDER — HYDROMORPHONE HCL 1 MG/ML IJ SOLN
1.0000 mg | Freq: Once | INTRAMUSCULAR | Status: AC
  Administered 2015-06-16: 1 mg via INTRAVENOUS
  Filled 2015-06-16: qty 1

## 2015-06-16 MED ORDER — FENTANYL 25 MCG/HR TD PT72
50.0000 ug | MEDICATED_PATCH | TRANSDERMAL | Status: DC
  Administered 2015-06-16: 50 ug via TRANSDERMAL
  Filled 2015-06-16: qty 2
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
## 2015-06-16 NOTE — Discharge Instructions (Signed)
Please read attached information in its entirety. Please monitor for concerning signs or symptoms, please remove the patch immediately if any present. Please return to emergency room immediately if they present. Please contact your primary care provider and you are surgeon for further evaluation and management.

## 2015-06-16 NOTE — ED Notes (Signed)
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  Has an implanted pain device on left side of abdomin that delivers a constant dosage of what he thinks is Morphine to patient into his spine for pain management.  The dr that manages the device is - Dr. Maryjean Ka, [facility].  Pain is excruciating in his right leg. 12/10 on pain scale.   Was just here last Tuesday or Wednesday for additional pain medications (Dilaudid).

## 2015-06-16 NOTE — ED Notes (Signed)
Pt reports he had a pain pump put in on 5/21 for cancer in pelvis. Pt had appointment to increased dose of main medication, but pain relief level not high enough.

## 2015-06-19 ENCOUNTER — Encounter (HOSPITAL_COMMUNITY): Payer: Self-pay | Admitting: Cardiology

## 2015-06-19 ENCOUNTER — Emergency Department (HOSPITAL_COMMUNITY)
Admission: EM | Admit: 2015-06-19 | Discharge: 2015-06-19 | Disposition: A | Discharge status: Home / Self Care | Payer: 59 | Attending: Emergency Medicine | Admitting: Emergency Medicine

## 2015-06-19 ENCOUNTER — Other Ambulatory Visit: Payer: Self-pay | Admitting: Neurosurgery

## 2015-06-19 DIAGNOSIS — Z85528 Personal history of other malignant neoplasm of kidney: Secondary | ICD-10-CM | POA: Diagnosis not present

## 2015-06-19 DIAGNOSIS — Z79899 Other long term (current) drug therapy: Secondary | ICD-10-CM | POA: Diagnosis not present

## 2015-06-19 DIAGNOSIS — Z978 Presence of other specified devices: Secondary | ICD-10-CM

## 2015-06-19 DIAGNOSIS — M25559 Pain in unspecified hip: Secondary | ICD-10-CM | POA: Diagnosis present

## 2015-06-19 DIAGNOSIS — Z923 Personal history of irradiation: Secondary | ICD-10-CM | POA: Diagnosis not present

## 2015-06-19 DIAGNOSIS — M25551 Pain in right hip: Secondary | ICD-10-CM | POA: Diagnosis not present

## 2015-06-19 DIAGNOSIS — I10 Essential (primary) hypertension: Secondary | ICD-10-CM | POA: Diagnosis not present

## 2015-06-19 DIAGNOSIS — Z87828 Personal history of other (healed) physical injury and trauma: Secondary | ICD-10-CM | POA: Diagnosis not present

## 2015-06-19 DIAGNOSIS — F419 Anxiety disorder, unspecified: Secondary | ICD-10-CM | POA: Diagnosis not present

## 2015-06-19 DIAGNOSIS — Z720 Tobacco use: Secondary | ICD-10-CM | POA: Insufficient documentation

## 2015-06-19 DIAGNOSIS — Z8507 Personal history of malignant neoplasm of pancreas: Secondary | ICD-10-CM | POA: Diagnosis not present

## 2015-06-19 DIAGNOSIS — K5900 Constipation, unspecified: Secondary | ICD-10-CM | POA: Insufficient documentation

## 2015-06-19 DIAGNOSIS — Z8719 Personal history of other diseases of the digestive system: Secondary | ICD-10-CM | POA: Diagnosis not present

## 2015-06-19 DIAGNOSIS — Z85118 Personal history of other malignant neoplasm of bronchus and lung: Secondary | ICD-10-CM | POA: Diagnosis not present

## 2015-06-19 DIAGNOSIS — G709 Myoneural disorder, unspecified: Secondary | ICD-10-CM | POA: Insufficient documentation

## 2015-06-19 DIAGNOSIS — M5417 Radiculopathy, lumbosacral region: Secondary | ICD-10-CM

## 2015-06-19 NOTE — ED Notes (Signed)
Pt placed in gown and in bed. Pt monitored by pulse ox, bp cuff, and 5-lead. 
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
## 2015-06-19 NOTE — Discharge Instructions (Signed)
Please go to the appointment with Dr. Maryjean Ka today at 1:00.   They will be able to adjust your pump.

## 2015-06-19 NOTE — ED Notes (Addendum)
Pt reports right hip pain that he developed from cancer. States he has a pain pump in his back that it suppose to help but thinks that it may not be working correctly. States he has multiple cancer sites but is taking a chemo pill for treatment. Pt reports the pump was placed on the 25th of last month.

## 2015-06-20 ENCOUNTER — Other Ambulatory Visit (HOSPITAL_BASED_OUTPATIENT_CLINIC_OR_DEPARTMENT_OTHER): Payer: 59

## 2015-06-20 ENCOUNTER — Ambulatory Visit (HOSPITAL_COMMUNITY)
Admission: RE | Admit: 2015-06-20 | Discharge: 2015-06-20 | Disposition: A | Discharge status: Home / Self Care | Payer: 59 | Source: Ambulatory Visit | Attending: Oncology | Admitting: Oncology

## 2015-06-20 ENCOUNTER — Other Ambulatory Visit: Payer: Self-pay | Admitting: Oncology

## 2015-06-20 ENCOUNTER — Encounter (HOSPITAL_COMMUNITY): Payer: Self-pay

## 2015-06-20 DIAGNOSIS — C649 Malignant neoplasm of unspecified kidney, except renal pelvis: Secondary | ICD-10-CM

## 2015-06-20 DIAGNOSIS — Z85118 Personal history of other malignant neoplasm of bronchus and lung: Secondary | ICD-10-CM | POA: Insufficient documentation

## 2015-06-20 DIAGNOSIS — C7951 Secondary malignant neoplasm of bone: Secondary | ICD-10-CM | POA: Diagnosis not present

## 2015-06-20 DIAGNOSIS — Z85528 Personal history of other malignant neoplasm of kidney: Secondary | ICD-10-CM | POA: Insufficient documentation

## 2015-06-20 DIAGNOSIS — Z905 Acquired absence of kidney: Secondary | ICD-10-CM | POA: Diagnosis not present

## 2015-06-20 DIAGNOSIS — C7800 Secondary malignant neoplasm of unspecified lung: Secondary | ICD-10-CM

## 2015-06-20 DIAGNOSIS — C7989 Secondary malignant neoplasm of other specified sites: Secondary | ICD-10-CM | POA: Diagnosis not present

## 2015-06-20 LAB — COMPREHENSIVE METABOLIC PANEL (CC13)
ALT: 23 U/L (ref 0–55)
AST: 19 U/L (ref 5–34)
Albumin: 3.4 g/dL — ABNORMAL LOW (ref 3.5–5.0)
Alkaline Phosphatase: 109 U/L (ref 40–150)
Anion Gap: 9 mEq/L (ref 3–11)
BUN: 9.4 mg/dL (ref 7.0–26.0)
CO2: 29 mEq/L (ref 22–29)
Calcium: 9.1 mg/dL (ref 8.4–10.4)
Chloride: 101 mEq/L (ref 98–109)
Creatinine: 1.2 mg/dL (ref 0.7–1.3)
EGFR: 74 mL/min/{1.73_m2} — ABNORMAL LOW (ref >90)
Glucose: 160 mg/dl — ABNORMAL HIGH (ref 70–140)
Potassium: 4.3 mEq/L (ref 3.5–5.1)
Sodium: 138 mEq/L (ref 136–145)
Total Bilirubin: 0.4 mg/dL (ref 0.20–1.20)
Total Protein: 7 g/dL (ref 6.4–8.3)

## 2015-06-20 LAB — CBC WITH DIFFERENTIAL/PLATELET
BASO%: 0.7 % (ref 0.0–2.0)
Basophils Absolute: 0 10*3/uL (ref 0.0–0.1)
EOS%: 4.5 % (ref 0.0–7.0)
Eosinophils Absolute: 0.2 10*3/uL (ref 0.0–0.5)
HCT: 44.4 % (ref 38.4–49.9)
HGB: 14.8 g/dL (ref 13.0–17.1)
LYMPH%: 28.9 % (ref 14.0–49.0)
MCH: 35 pg — ABNORMAL HIGH (ref 27.2–33.4)
MCHC: 33.4 g/dL (ref 32.0–36.0)
MCV: 104.9 fL — ABNORMAL HIGH (ref 79.3–98.0)
MONO#: 0.5 10*3/uL (ref 0.1–0.9)
MONO%: 9.1 % (ref 0.0–14.0)
NEUT#: 2.9 10*3/uL (ref 1.5–6.5)
NEUT%: 56.8 % (ref 39.0–75.0)
Platelets: 163 10*3/uL (ref 140–400)
RBC: 4.24 10*6/uL (ref 4.20–5.82)
RDW: 15.1 % — ABNORMAL HIGH (ref 11.0–14.6)
WBC: 5.1 10*3/uL (ref 4.0–10.3)
lymph#: 1.5 10*3/uL (ref 0.9–3.3)

## 2015-06-20 MED ORDER — IOHEXOL 300 MG/ML  SOLN
100.0000 mL | Freq: Once | INTRAMUSCULAR | Status: AC | PRN
  Administered 2015-06-20: 100 mL via INTRAVENOUS

## 2015-06-22 ENCOUNTER — Ambulatory Visit (HOSPITAL_BASED_OUTPATIENT_CLINIC_OR_DEPARTMENT_OTHER): Payer: 59 | Admitting: Oncology

## 2015-06-22 ENCOUNTER — Telehealth: Payer: Self-pay | Admitting: Oncology

## 2015-06-22 VITALS — BP 136/95 | HR 77 | Temp 97.9°F | Resp 18 | Ht 75.0 in | Wt 199.8 lb

## 2015-06-22 DIAGNOSIS — C7989 Secondary malignant neoplasm of other specified sites: Secondary | ICD-10-CM | POA: Diagnosis not present

## 2015-06-22 DIAGNOSIS — M899 Disorder of bone, unspecified: Secondary | ICD-10-CM | POA: Diagnosis not present

## 2015-06-22 DIAGNOSIS — C7801 Secondary malignant neoplasm of right lung: Secondary | ICD-10-CM

## 2015-06-22 DIAGNOSIS — C649 Malignant neoplasm of unspecified kidney, except renal pelvis: Secondary | ICD-10-CM

## 2015-06-22 DIAGNOSIS — I10 Essential (primary) hypertension: Secondary | ICD-10-CM

## 2015-06-22 DIAGNOSIS — M792 Neuralgia and neuritis, unspecified: Secondary | ICD-10-CM

## 2015-06-22 DIAGNOSIS — F341 Dysthymic disorder: Secondary | ICD-10-CM

## 2015-06-22 DIAGNOSIS — R634 Abnormal weight loss: Secondary | ICD-10-CM

## 2015-06-22 MED ORDER — ALPRAZOLAM 1 MG PO TABS: 1.0000 mg | ORAL_TABLET | Freq: Three times a day (TID) | ORAL | Status: DC | PRN

## 2015-06-22 NOTE — Telephone Encounter (Signed)
Gave and printed appt sched and avs fo rpt for July and AUg

## 2015-06-22 NOTE — Progress Notes (Signed)
Hematology and Oncology Follow Up Visit  Erven Ramson [id number] May 07, 1966 49 y.o. 06/22/2015 9:47 AM  [name], MD  [name], M.D.  [name], MD  [name], MD    Principle Diagnosis: This is a 49 year old gentleman with stage IV renal cell carcinoma diagnosed in 2009.  Prior Therapy: 1. Status post laparoscopic radical nephrectomy.  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  2. Patient status post thoracotomy for a synchronous metastatic lung lesions done October 2009.  He had a lower lobe nodule, biopsy proven to be metastatic renal cell carcinoma.   3. Patient is status post stereotactic radiotherapy to pulmonary nodules in May of 2010. 4. He is S/P Sutent 50 mg 4 weeks on 2 weeks off from 10/2010 to 03/2013. He progressed at that time.  5. He is S/P radiation to the right sacral bone between 4/22 to 4/30.  6. He is S/P XRT to the left shoulder 03/20/14 to 03/31/14.   Current therapy: He is on Votrient 800 mg since 03/2013. His disease includes lung lesions, pancreatic as well as right sacral lesion.   Interim History:  Mr. Danzer presents today for a followup visit. Since his last visit, he reports doing poorly with increase in his right hip pain. He was evaluated by Dr Maryjean Ka and had an epidural injection which has not helped his pain at all. He has noticed the increase in the intensity of pain that he describes as stabbing pain with quality which is up 10 out of 10 at times. He does not report any neurological symptoms associated with it. He still able to ambulate without any difficulties. He is able to drive and attends to activities of daily living. He continues to tolerate his Votrient without significant difficulty. He does have occasional loose bowel habits but fairly manageable. Has not reported any other GI complications. They little fatigue is noted. He was seen in the emergency department twice in the last week.    He has  not reported any other episodes of nausea or vomiting. He is not reporting any constipation. Has not reported any recent hospitalizations or illnesses. He did not report any headaches blurred vision or double vision. Does not report any seizure activity or alteration of mental status. Is not reporting any chest pain or difficulty breathing. Is not reporting any cough or hemoptysis. Does not report any abdominal pain or hematochezia. Does not report any hematuria but does report occasional hesitancy and nocturia. He does not report any rashes or lesions or petechiae. He does not report any lymphadenopathy. His performance status and activity level remain relatively stable at this time. Remainder of his review of system is unremarkable.  Medications: I have reviewed the patient's current medications.  Current Outpatient Prescriptions  Medication Sig Dispense Refill  . ALPRAZolam (XANAX) 1 MG tablet Take 1 tablet (1 mg total) by mouth 3 (three) times daily as needed for anxiety. 100 tablet 0  . clindamycin (CLEOCIN) 150 MG capsule Take 1 capsule (150 mg total) by mouth 3 (three) times daily. 30 capsule 0  . DULoxetine (CYMBALTA) 60 MG capsule TAKE 1 CAPSULE TWICE DAILY 60 capsule 2  . gabapentin (NEURONTIN) 300 MG capsule Take 1 capsule (300 mg total) by mouth 3 (three) times daily. 90 capsule 3  . HYDROmorphone (DILAUDID) 8 MG tablet Take 1 tablet (8 mg total) by mouth every 4 (four) hours as needed for moderate pain or severe pain. 50 tablet 0  .  ibuprofen (ADVIL,MOTRIN) 800 MG tablet Take 800 mg by mouth every 8 (eight) hours as needed for moderate pain.     . pazopanib (VOTRIENT) 200 MG tablet Take 4 tablets (800 mg total) by mouth every morning. Take on an empty stomach. 120 tablet 0  . tiZANidine (ZANAFLEX) 4 MG tablet Take 4 mg by mouth every 8 (eight) hours as needed for muscle spasms.   0  . VIAGRA 100 MG tablet Take 50 mg by mouth as needed for erectile dysfunction.      No current  facility-administered medications for this visit.     Allergies:  Allergies  Allergen Reactions  . Ceftriaxone Hives  . Hydrocodone Swelling    Past Medical History, Surgical history, Social history, and Family History were reviewed and updated.   Physical Exam: Blood pressure 136/95, pulse 77, temperature 97.9 F (36.6 C), temperature source Oral, resp. rate 18, height '6\' 3"'$  (1.905 m), weight 199 lb 12.8 oz (90.629 kg), SpO2 99 %. ECOG: 1 General appearance: alert and awake not in any distress. Head: Normocephalic, without obvious abnormality.  Neck: no adenopathy, no masses.  Lymph nodes: Cervical, supraclavicular, and axillary nodes normal. Heart:regular rate and rhythm, S1, S2.  Lung:chest clear, no wheezing, rales, normal symmetric air entry. No dullness to percussion.  Abdomen: soft, non-tender, without masses or organomegaly EXT:no edema, no despumation. No erythema noted.   Neuro: no focal deficits noted. No weakness noted in his lower extremities.  Lab Results: Lab Results  Component Value Date   WBC 5.1 06/20/2015   HGB 14.8 06/20/2015   HCT 44.4 06/20/2015   MCV 104.9* 06/20/2015   PLT 163 06/20/2015   EXAM: CT CHEST, ABDOMEN, AND PELVIS WITH CONTRAST  TECHNIQUE: Multidetector CT imaging of the chest, abdomen and pelvis was performed following the standard protocol during bolus administration of intravenous contrast.  CONTRAST: 149m OMNIPAQUE IOHEXOL 300 MG/ML SOLN  COMPARISON: CT scan 12/29/2014  FINDINGS: CT CHEST FINDINGS  Chest wall: No chest wall mass, supraclavicular or axillary lymphadenopathy. The thyroid gland appears normal. The bony thorax is intact. No destructive bone lesions or spinal canal compromise. Evidence of remote right-sided thoracotomy with heel rib fractures.  Mediastinum: The heart is normal in size. No pericardial effusion. The aorta is normal in caliber. No dissection. The branch vessels are patent. No enlarged  mediastinal lymph nodes. Slight progression of left hilar lymphadenopathy when compared to the prior examination. This measures 24.5 x 15 mm on image number 50 and previously measured 20 x 9 mm. The esophagus is normal.  Lungs/ pleura: Stable advanced emphysematous changes. Stable dense scarring changes in the right long likely a combination of surgery and radiation. Slightly progressive nodular soft tissue density in the right middle lobe surrounding the right middle lobe bronchi. This measures 26 x 24.5 mm on image number 66 and previously measured 22 x 14 mm.  There are few small, sub 3 mm subpleural pulmonary nodules which are stable.  No new pulmonary nodules. No pleural effusion.  CT ABDOMEN AND PELVIS FINDINGS  Hepatobiliary: No focal hepatic lesions to suggest hepatic metastatic disease. No intrahepatic biliary dilatation. The portal and hepatic veins are patent. The gallbladder is normal. No common bile duct dilatation.  Pancreas: Slight interval enlargement of pancreatic lesions. The enhancing lesion in the pancreatic head measures 26 x 24 mm on image number 117 and previously measured 23 x 16.5 mm. The pancreatic body lesion is also slightly larger measuring approximately 38 x 19 mm and previously measuring 27  x 19 mm. Stable areas of significant segmental dilatation of the main pancreatic duct. Moderate atrophy of the pancreatic tail.  Spleen: Normal size. No focal lesions.  Adrenals/Urinary Tract: The adrenal glands are normal.  The left kidney is surgically absent. The right kidney is normal.  Stomach/Bowel: The stomach, duodenum, small bowel and colon are unremarkable. No inflammatory changes, mass lesions or obstructive findings.  Vascular/Lymphatic: The aorta and branch vessels are patent. The major venous structures are patent. Small scattered mesenteric and retroperitoneal lymph nodes but no mass or adenopathy.  Other: The bladder,  prostate gland and seminal vesicles are unremarkable. No pelvic mass or adenopathy. No free pelvic fluid collections. No inguinal mass or adenopathy.  Musculoskeletal: Enlarging right sacral lytic lesion which measures 35.5 x 36.5 mm on image number 182. It previously measured 30 x 23 mm.  No new bone lesions.  IMPRESSION: 1. Interval slight progression of disease in the chest as described above. Enlarging left hilar adenopathy and right middle lobe peribronchial lesion. 2. Stable surgical and radiation changes in the chest. No new pulmonary nodules and no mediastinal adenopathy. 3. Stable emphysematous changes and pulmonary scarring. 4. Slight interval enlargement of the pancreatic head and body masses. Persistent pancreatic ductal dilatation and pancreatic tail atrophy. 5. Status post left nephrectomy. No mass in the left nephrectomy bed and no retroperitoneal adenopathy. 6. Enlarging right sacral lytic lesion. No new bone lesions are identified.   Impression and Plan:  This is a pleasant 49 year old gentleman with the following issues. 1. Metastatic renal cell carcinoma.  He has documented disease to the lung and the pancreas.  He tolerating Votrient without complications. CT scan from 06/20/2015 was reviewed today and showed progression of disease. His disease progressed in the right sacral mass as well as his pancreatic mass. Options of treatment were discussed today with different salvage therapies. These options would include immunotherapy in the form of Nivolumab  versus a different oral agent that includes Inlyta or Cabozanitinb. Risks and benefits of all these approaches were discussed with the patient today. I feel that he would benefit from immunotherapy at this time given the chronic nature of his illness and what might help altering the natural course of his disease. Combination associated with this treatment were discussed. Immune associated complications such as  pneumonitis, endocrinopathy among others were reviewed. This will be scheduled to start tentatively on 06/29/2015 2. Depression/Anxiety: He remains on Xanax and Cymbalta. His mood continues to be excellent. 3. Weight loss: Likely related to progression of cancer. 4. Sacral mass: S/P radiation to that area and recently received an injection into that area. I will last Dr. Tammi Klippel for radiation oncology whether further radiation can help in this particular setting. 5. Neuropathic pain. He is on gabapentin and Dilaudid for pain.  6. Hypertension: Controlled at this time on Votrient. 7. Follow up: in one week to start therapy.   Zola Button, MD

## 2015-06-29 ENCOUNTER — Ambulatory Visit (HOSPITAL_BASED_OUTPATIENT_CLINIC_OR_DEPARTMENT_OTHER): Payer: 59

## 2015-06-29 ENCOUNTER — Other Ambulatory Visit (HOSPITAL_BASED_OUTPATIENT_CLINIC_OR_DEPARTMENT_OTHER): Payer: 59

## 2015-06-29 ENCOUNTER — Other Ambulatory Visit: Payer: Self-pay | Admitting: *Deleted

## 2015-06-29 ENCOUNTER — Encounter: Payer: Self-pay | Admitting: Nurse Practitioner

## 2015-06-29 ENCOUNTER — Other Ambulatory Visit (HOSPITAL_BASED_OUTPATIENT_CLINIC_OR_DEPARTMENT_OTHER): Payer: 59 | Admitting: *Deleted

## 2015-06-29 ENCOUNTER — Ambulatory Visit (HOSPITAL_BASED_OUTPATIENT_CLINIC_OR_DEPARTMENT_OTHER): Payer: 59 | Admitting: Nurse Practitioner

## 2015-06-29 VITALS — BP 143/89 | HR 63 | Temp 97.4°F

## 2015-06-29 DIAGNOSIS — Z5112 Encounter for antineoplastic immunotherapy: Secondary | ICD-10-CM

## 2015-06-29 DIAGNOSIS — C649 Malignant neoplasm of unspecified kidney, except renal pelvis: Secondary | ICD-10-CM

## 2015-06-29 DIAGNOSIS — C7951 Secondary malignant neoplasm of bone: Secondary | ICD-10-CM

## 2015-06-29 DIAGNOSIS — C7989 Secondary malignant neoplasm of other specified sites: Secondary | ICD-10-CM

## 2015-06-29 DIAGNOSIS — R55 Syncope and collapse: Secondary | ICD-10-CM | POA: Diagnosis not present

## 2015-06-29 DIAGNOSIS — C7801 Secondary malignant neoplasm of right lung: Secondary | ICD-10-CM

## 2015-06-29 LAB — CBC WITH DIFFERENTIAL/PLATELET
BASO%: 0.2 % (ref 0.0–2.0)
Basophils Absolute: 0 10*3/uL (ref 0.0–0.1)
EOS%: 1 % (ref 0.0–7.0)
Eosinophils Absolute: 0.1 10*3/uL (ref 0.0–0.5)
HCT: 45 % (ref 38.4–49.9)
HGB: 15 g/dL (ref 13.0–17.1)
LYMPH%: 18.4 % (ref 14.0–49.0)
MCH: 35.8 pg — ABNORMAL HIGH (ref 27.2–33.4)
MCHC: 33.3 g/dL (ref 32.0–36.0)
MCV: 107.4 fL — ABNORMAL HIGH (ref 79.3–98.0)
MONO#: 0.7 10*3/uL (ref 0.1–0.9)
MONO%: 7 % (ref 0.0–14.0)
NEUT#: 7.4 10*3/uL — ABNORMAL HIGH (ref 1.5–6.5)
NEUT%: 73.4 % (ref 39.0–75.0)
Platelets: 234 10*3/uL (ref 140–400)
RBC: 4.19 10*6/uL — ABNORMAL LOW (ref 4.20–5.82)
RDW: 14.4 % (ref 11.0–14.6)
WBC: 10.1 10*3/uL (ref 4.0–10.3)
lymph#: 1.9 10*3/uL (ref 0.9–3.3)

## 2015-06-29 LAB — TSH CHCC: TSH: 1.334 m(IU)/L (ref 0.320–4.118)

## 2015-06-29 LAB — COMPREHENSIVE METABOLIC PANEL (CC13)
ALT: 35 U/L (ref 0–55)
AST: 18 U/L (ref 5–34)
Albumin: 3.8 g/dL (ref 3.5–5.0)
Alkaline Phosphatase: 111 U/L (ref 40–150)
Anion Gap: 8 mEq/L (ref 3–11)
BUN: 22.3 mg/dL (ref 7.0–26.0)
CO2: 28 mEq/L (ref 22–29)
Calcium: 9.8 mg/dL (ref 8.4–10.4)
Chloride: 100 mEq/L (ref 98–109)
Creatinine: 1.2 mg/dL (ref 0.7–1.3)
EGFR: 69 mL/min/{1.73_m2} — ABNORMAL LOW (ref >90)
Glucose: 176 mg/dl — ABNORMAL HIGH (ref 70–140)
Potassium: 4.4 mEq/L (ref 3.5–5.1)
Sodium: 136 mEq/L (ref 136–145)
Total Bilirubin: 0.48 mg/dL (ref 0.20–1.20)
Total Protein: 7.6 g/dL (ref 6.4–8.3)

## 2015-06-29 LAB — WHOLE BLOOD GLUCOSE: Glucose: 160 mg/dL — ABNORMAL HIGH (ref 70–100)

## 2015-06-29 MED ORDER — SODIUM CHLORIDE 0.9 % IV SOLN
Freq: Once | INTRAVENOUS | Status: AC
  Administered 2015-06-29: 09:00:00 via INTRAVENOUS

## 2015-06-29 MED ORDER — SODIUM CHLORIDE 0.9 % IV SOLN
3.0000 mg/kg | Freq: Once | INTRAVENOUS | Status: AC
  Administered 2015-06-29: 280 mg via INTRAVENOUS
  Filled 2015-06-29: qty 28

## 2015-06-29 NOTE — Assessment & Plan Note (Signed)
Patient presented to the [facility] today for his first cycle of Nivolumab immunotherapy.  Labs drawn today were stable.  Plan is for the patient to return tomorrow 06/30/2015 for an MRI of his chest/abdomen/pelvis and spine.  Patient has plans to follow-up with Dr. Tammi Klippel radiation oncologist on 07/02/2015.  Patient is scheduled to return to the [facility] on 07/13/2015 for labs, physical, and next chemotherapy.

## 2015-06-29 NOTE — Assessment & Plan Note (Signed)
Patient experienced near syncope in the midst of a lab draw here at the [facility] this morning.  He states that he has had nothing to eat and only coffee to drink since 10:30 PM last night.  He became pale, diaphoretic, dizzy, lightheaded, and vomited twice while in the lab area.  Vital signs remained essentially stable.  Reviewed labs drawn this morning; and all were essentially stable.  Patient was given IV fluid rehydration; and began to feel much better.  He was able to drink fluids and eat a snack of graham crackers with no further difficulty.  Patient stated that he felt much better at; and was able to proceed with his first cycle of Nivolumab immunotherapy.
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
## 2015-06-29 NOTE — Progress Notes (Signed)
Patient was light-headed, dizzy and diaphoretic with lab draw.  He vomited twice.  Selena Lesser NP there to see patient.  Patient states he has not had anything besides coffee since 10:30 last night. Patient here in Infusion. States he feels much better.  Taking po fluids and graham crackers. IV started and labs drawn. 10am.  Selena Lesser NP back to see patient.  Labs are fine.  He is ok to proceed with nivolumab.

## 2015-06-29 NOTE — Patient Instructions (Signed)
[facility] Discharge Instructions for Patients Receiving Chemotherapy  Today you received the following chemotherapy agents nivolumab.     If you develop nausea and vomiting , call the clinic.   BELOW ARE SYMPTOMS THAT SHOULD BE REPORTED IMMEDIATELY:  *FEVER GREATER THAN 100.5 F  *CHILLS WITH OR WITHOUT FEVER  NAUSEA AND VOMITING THAT IS NOT CONTROLLED WITH YOUR NAUSEA MEDICATION  *UNUSUAL SHORTNESS OF BREATH  *UNUSUAL BRUISING OR BLEEDING  TENDERNESS IN MOUTH AND THROAT WITH OR WITHOUT PRESENCE OF ULCERS  *URINARY PROBLEMS  *BOWEL PROBLEMS  UNUSUAL RASH Items with * indicate a potential emergency and should be followed up as soon as possible.  Feel free to call the clinic you have any questions or concerns. The clinic phone number is [phone].  Please show the [facility] at check-in to the Emergency Department and triage nurse. Nivolumab injection What is this medicine? NIVOLUMAB (nye VOL ue mab) is used to treat certain types of melanoma and lung cancer. This medicine may be used for other purposes; ask your health care provider or pharmacist if you have questions. COMMON BRAND NAME(S): Opdivo What should I tell my health care provider before I take this medicine? They need to know if you have any of these conditions: -eye disease, vision problems -history of pancreatitis -immune system problems -inflammatory bowel disease -kidney disease -liver disease -lung disease -lupus -myasthenia gravis -multiple sclerosis -organ transplant -stomach or intestine problems -thyroid disease -tingling of the fingers or toes, or other nerve disorder -an unusual or allergic reaction to nivolumab, other medicines, foods, dyes, or preservatives -pregnant or trying to get pregnant -breast-feeding How should I use this medicine? This medicine is for infusion into a vein. It is given by a health care professional in a hospital or clinic setting. A  special MedGuide will be given to you before each treatment. Be sure to read this information carefully each time. Talk to your pediatrician regarding the use of this medicine in children. Special care may be needed. Overdosage: If you think you've taken too much of this medicine contact a poison control center or emergency room at once. Overdosage: If you think you have taken too much of this medicine contact a poison control center or emergency room at once. NOTE: This medicine is only for you. Do not share this medicine with others. What if I miss a dose? It is important not to miss your dose. Call your doctor or health care professional if you are unable to keep an appointment. What may interact with this medicine? Interactions have not been studied. This list may not describe all possible interactions. Give your health care provider a list of all the medicines, herbs, non-prescription drugs, or dietary supplements you use. Also tell them if you smoke, drink alcohol, or use illegal drugs. Some items may interact with your medicine. What should I watch for while using this medicine? Tell your doctor or healthcare professional if your symptoms do not start to get better or if they get worse. Your condition will be monitored carefully while you are receiving this medicine. You may need blood work done while you are taking this medicine. What side effects may I notice from receiving this medicine? Side effects that you should report to your doctor or health care professional as soon as possible: -allergic reactions like skin rash, itching or hives, swelling of the face, lips, or tongue -black, tarry stools -bloody or watery diarrhea -changes in vision -chills -cough -depressed mood -eye  pain -feeling anxious -fever -general ill feeling or flu-like symptoms -hair loss -loss of appetite -low blood counts - this medicine may decrease the number of white blood cells, red blood cells and  platelets. You may be at increased risk for infections and bleeding -pain, tingling, numbness in the hands or feet -redness, blistering, peeling or loosening of the skin, including inside the mouth -red pinpoint spots on skin -signs of decreased platelets or bleeding - bruising, pinpoint red spots on the skin, black, tarry stools, blood in the urine -signs of decreased red blood cells - unusually weak or tired, feeling faint or lightheaded, falls -signs of infection - fever or chills, cough, sore throat, pain or trouble passing urine -signs and symptoms of a dangerous change in heartbeat or heart rhythm like chest pain; dizziness; fast or irregular heartbeat; palpitations; feeling faint or lightheaded, falls; breathing problems -signs and symptoms of high blood sugar such as dizziness; dry mouth; dry skin; fruity breath; nausea; stomach pain; increased hunger or thirst; increased urination -signs and symptoms of kidney injury like trouble passing urine or change in the amount of urine -signs and symptoms of liver injury like dark yellow or brown urine; general ill feeling or flu-like symptoms; light-colored stools; loss of appetite; nausea; right upper belly pain; unusually weak or tired; yellowing of the eyes or skin -signs and symptoms of increased potassium like muscle weakness; chest pain; or fast, irregular heartbeat -signs and symptoms of low potassium like muscle cramps or muscle pain; chest pain; dizziness; feeling faint or lightheaded, falls; palpitations; breathing problems; or fast, irregular heartbeat -swelling of the ankles, feet, hands -weight gainSide effects that usually do not require medical attention (report to your doctor or health care professional if they continue or are bothersome): -constipation -general ill feeling or flu-like symptoms -hair loss -loss of appetite -nausea, vomiting This list may not describe all possible side effects. Call your doctor for medical advice  about side effects. You may report side effects to FDA at 1-800-FDA-1088. Where should I keep my medicine? This drug is given in a hospital or clinic and will not be stored at home. NOTE: This sheet is a summary. It may not cover all possible information. If you have questions about this medicine, talk to your doctor, pharmacist, or health care provider.  2015, Elsevier/Gold Standard. (2014-02-27 13:18:19)

## 2015-06-30 ENCOUNTER — Inpatient Hospital Stay: Admission: RE | Admit: 2015-06-30 | Payer: 59 | Source: Ambulatory Visit

## 2015-06-30 ENCOUNTER — Inpatient Hospital Stay
Admission: RE | Admit: 2015-06-30 | Discharge: 2015-06-30 | Disposition: A | Discharge status: Home / Self Care | Payer: 59 | Source: Ambulatory Visit | Attending: Neurosurgery | Admitting: Neurosurgery

## 2015-07-02 ENCOUNTER — Ambulatory Visit
Admission: RE | Admit: 2015-07-02 | Discharge: 2015-07-02 | Disposition: A | Discharge status: Home / Self Care | Payer: 59 | Source: Ambulatory Visit | Attending: Radiation Oncology | Admitting: Radiation Oncology

## 2015-07-02 ENCOUNTER — Ambulatory Visit: Admission: RE | Admit: 2015-07-02 | Payer: 59 | Source: Ambulatory Visit

## 2015-07-02 ENCOUNTER — Encounter: Payer: Self-pay | Admitting: Radiation Oncology

## 2015-07-02 DIAGNOSIS — C7951 Secondary malignant neoplasm of bone: Secondary | ICD-10-CM | POA: Insufficient documentation

## 2015-07-02 DIAGNOSIS — Z510 Encounter for antineoplastic radiation therapy: Secondary | ICD-10-CM | POA: Insufficient documentation

## 2015-07-02 DIAGNOSIS — C649 Malignant neoplasm of unspecified kidney, except renal pelvis: Secondary | ICD-10-CM | POA: Insufficient documentation

## 2015-07-02 DIAGNOSIS — C7801 Secondary malignant neoplasm of right lung: Secondary | ICD-10-CM | POA: Insufficient documentation

## 2015-07-02 NOTE — Progress Notes (Signed)
No show.  Reschedule.

## 2015-07-02 NOTE — Progress Notes (Signed)
Histology and Location of Primary Cancer: stage IV renal cell carcinoma diagnosed in 2009  Sites of Visceral and Bony Metastatic Disease: enlarging left hilar adenopathy and right middle lobe peribronchial lesion, slight interval enlargement of the pancreatic head and boy  Location(s) of Symptomatic Metastases: enlarging right sacral lytic lesion  Past/Anticipated chemotherapy by medical oncology, if any:  He is on Votrient 800 mg since 03/2013. Salvage options would include immunotherapy in the form of Nivolumab versus a different oral agent that includes Inlyta or Cabozanitinb. Risks and benefits of all these approaches were discussed with the patient today. I feel that he would benefit from immunotherapy at this time given the chronic nature of his illness and what might help altering the natural course of his disease.  Pain on a scale of 0-10 is: stabbing pain with quality which is up to 10 out of 10 at times   If Spine Met(s), symptoms, if any, include: 1. Bowel/Bladder retention or incontinence (please describe): NO 2. Numbness or weakness in extremities (please describe): No neurological symptoms at this time 3. Current Decadron regimen, if applicable: NO  Ambulatory status? Walker? Wheelchair?: ambulatory  SAFETY ISSUES:  Prior radiation? YES  Pacemaker/ICD? NO  Possible current pregnancy? NO  Is the patient on methotrexate? NO  Current Complaints / other details: 49 year old male. Referred by [facility] for palliative radiation treatment.

## 2015-07-03 ENCOUNTER — Encounter: Payer: Self-pay | Admitting: *Deleted

## 2015-07-03 NOTE — Progress Notes (Signed)
Called patient yesterday and today as a chemo follow up call. 1st time nivolumab. Lm on answering machine to call me.

## 2015-07-06 ENCOUNTER — Telehealth: Payer: Self-pay | Admitting: *Deleted

## 2015-07-06 ENCOUNTER — Other Ambulatory Visit: Payer: 59

## 2015-07-06 NOTE — Telephone Encounter (Signed)
Patient's wife called regarding his appts, and how early they can be. I told her that if the patient does lab/MD visit the day before we can start at 730am. If the patient has labs that day start at 800am. She will call back later.   JMW

## 2015-07-06 NOTE — Telephone Encounter (Signed)
Spoke with patient after his first nivolumab. Patient doing well, no c/o. Although there were multiple attempts for i.v. Access. He is requesting a port-a-cath

## 2015-07-09 ENCOUNTER — Other Ambulatory Visit: Payer: Self-pay | Admitting: Neurosurgery

## 2015-07-09 ENCOUNTER — Ambulatory Visit
Admission: RE | Admit: 2015-07-09 | Discharge: 2015-07-09 | Disposition: A | Discharge status: Home / Self Care | Payer: 59 | Source: Ambulatory Visit | Attending: Radiation Oncology | Admitting: Radiation Oncology

## 2015-07-09 ENCOUNTER — Encounter: Payer: Self-pay | Admitting: Radiation Oncology

## 2015-07-09 VITALS — BP 141/103 | HR 80 | Resp 16 | Wt 197.9 lb

## 2015-07-09 DIAGNOSIS — M5417 Radiculopathy, lumbosacral region: Secondary | ICD-10-CM

## 2015-07-09 DIAGNOSIS — C649 Malignant neoplasm of unspecified kidney, except renal pelvis: Secondary | ICD-10-CM | POA: Diagnosis present

## 2015-07-09 DIAGNOSIS — C7951 Secondary malignant neoplasm of bone: Secondary | ICD-10-CM

## 2015-07-09 DIAGNOSIS — C419 Malignant neoplasm of bone and articular cartilage, unspecified: Secondary | ICD-10-CM

## 2015-07-09 DIAGNOSIS — Z510 Encounter for antineoplastic radiation therapy: Secondary | ICD-10-CM | POA: Diagnosis present

## 2015-07-09 DIAGNOSIS — C7801 Secondary malignant neoplasm of right lung: Secondary | ICD-10-CM | POA: Diagnosis not present

## 2015-07-09 NOTE — Progress Notes (Signed)
Patient here today with a family friend. Reports Dr. Donell Sievert implanted a pain pump in his low left abdominal quadrant the end of May. Report intermittent right hip pain. Reports a few weeks ago the pain in his hip was so severe he presented to the ED four times in one week. Reports persistent numbness and tingling in his right leg. Reports he is managing constipation with a daily stool softeners and laxative.   BP 141/103 mmHg  Pulse 80  Resp 16  Wt 197 lb 14.4 oz (89.767 kg)  SpO2 100% Wt Readings from Last 3 Encounters:  07/09/15 197 lb 14.4 oz (89.767 kg)  06/22/15 199 lb 12.8 oz (90.629 kg)  06/19/15 206 lb (93.441 kg)

## 2015-07-09 NOTE — Progress Notes (Signed)
Radiation Oncology         [phone] ________________________________  Name: Keith [name] MRN: [id number]  Date: 07/09/2015  DOB: January 19, 1966  Follow-Up Visit Note    CC: [name], MD  [name], MD  Diagnosis:   49 yo man with painful left humerus metastasis from renal cell carcinoma  1. May 2011 - stereotactic body radiotherapy to three isolated pulmonary metastases (1.2 cm in the right upper lobe, 5 mm in the right upper lobe and 1 cm in the right lung base) to 50 Gy in five fractions  2. 4/22-30/2014 isolated right sacral oligometastatic deposit to 50 gray in 5 fractions  3.  03/20/2014-03/31/2014 The patient's left shoulder was treated to 30 gray in 10 fractions   Interval Since Last Radiation:  7  weeks  Narrative:  The patient returns today for routine follow-up.  Patient here today with a family friend. Reports Dr. Donell Sievert implanted a pain pump in his low left abdominal quadrant the end of May. Report intermittent right hip pain. Reports a few weeks ago the pain in his hip was so severe he presented to the ED four times in one week. Reports persistent numbness and tingling in his right leg. Reports he is managing constipation with a daily stool softeners and laxative. Notes he can't sit for prolonged periods of time.   Patient is currently undergoing first cycle of immunotherapy with Dr. [name].   Patient notes that he is scheduled for an MRI of the pelvis on July 20th at 2:30 pm.                              ALLERGIES:  is allergic to ceftriaxone and hydrocodone.  Meds: Current Outpatient Prescriptions  Medication Sig Dispense Refill  . ALPRAZolam (XANAX) 1 MG tablet Take 1 tablet (1 mg total) by mouth 3 (three) times daily as needed for anxiety. 100 tablet 0  . docusate sodium (COLACE) 100 MG capsule Take 100 mg by mouth 2 (two) times daily.    . DULoxetine (CYMBALTA) 60 MG capsule TAKE 1 CAPSULE TWICE DAILY 60 capsule 2  . gabapentin (NEURONTIN) 300 MG  capsule Take 1 capsule (300 mg total) by mouth 3 (three) times daily. 90 capsule 3  . HYDROmorphone (DILAUDID) 8 MG tablet Take 1 tablet (8 mg total) by mouth every 4 (four) hours as needed for moderate pain or severe pain. 50 tablet 0  . ibuprofen (ADVIL,MOTRIN) 800 MG tablet Take 800 mg by mouth every 8 (eight) hours as needed for moderate pain.     [name] senna (SENOKOT) 8.6 MG tablet Take 1 tablet by mouth daily.    [name] VIAGRA 100 MG tablet Take 50 mg by mouth as needed for erectile dysfunction.     [name] tiZANidine (ZANAFLEX) 4 MG tablet Take 4 mg by mouth every 8 (eight) hours as needed for muscle spasms.   0   No current facility-administered medications for this encounter.    Physical Findings: The patient is in no acute distress. Patient is alert and oriented.  weight is 197 lb 14.4 oz (89.767 kg). His blood pressure is 141/103 and his pulse is 80. His respiration is 16 and oxygen saturation is 100%. .  No significant changes. Patient has pain pump located on the left side of his abdomen  Lab Findings: Lab Results  Component Value Date   WBC 10.1 06/29/2015   HGB 15.0 06/29/2015   HCT 45.0 06/29/2015  MCV 107.4* 06/29/2015   PLT 234 06/29/2015    '@LASTCHEM'$ @  Radiographic Findings: Ct Chest W Contrast  05/16/2014   CLINICAL DATA:  Metastatic renal cell carcinoma for restaging  EXAM: CT CHEST, ABDOMEN, AND PELVIS WITH CONTRAST  TECHNIQUE: Multidetector CT imaging of the chest, abdomen and pelvis was performed following the standard protocol during bolus administration of intravenous contrast.  CONTRAST:  157m OMNIPAQUE IOHEXOL 300 MG/ML  SOLN  COMPARISON:  January 20, 2014 CT scan ; September 20, 2013 CT scan  FINDINGS: CT CHEST FINDINGS  Thoracic inlet is normal. Thyroid is normal. There is no significant mediastinal or left hilar adenopathy. There are no pleural effusions. The left lung is clear. Old superior posterior right rib fracture, healed, is stable.  Architectural distortion  right upper lobe is stable. Architectural distortion medial right lower lobe is stable. Again identified is a right perihilar lesion seen anterior to the right hilum, today measuring 24 x 20 mm, previously measuring 21 x 17 mm. On image number 26 there is a 4 mm pulmonary nodule laterally in the right upper lobe which is stable from January 20, 2014.  CT ABDOMEN AND PELVIS FINDINGS  The liver, gallbladder, spleen, and adrenal glands are normal. A tiny low-attenuation lesion lower pole right kidney is well below a cm in size and too small to characterize, but stable. The left kidney is surgically absent.  There is no significant mesenteric or retroperitoneal adenopathy. There is no ascites. The abdominal aorta is not dilated.  There is a nonobstructive bowel gas pattern. Bowel and appendix are normal. Bladder and reproductive organs are normal. There are no acute or concerning musculoskeletal abnormalities in the abdomen and pelvis.  There are multiple hypervascular pancreatic lesions. Most inferiorly in the pancreatic head, there is a 12 x 10 mm enhancing lesion that previously measured 16 x 12 mm. More proximally in the neck of the pancreas, there is an enhancing lesion that measures 12 x 9 mm as compared to 16 x 12 mm previously. In the body of the pancreas there is a hyper attenuating 28 x 17 mm lesion that previously measured 30 x 17 mm. There are multiple dilatations of the pancreatic duct, up to a diameter of 11 mm, similar to the prior study.  IMPRESSION: 1. Slight interval increase in the size of the right lung lesion from 21 x 17 to 24 x 20 mm. 2. Numerous hypervascular pancreatic lesions, all showing interval mild decrease in size. Similar associated pancreatic ductal dilatation.   Electronically Signed   By: RSkipper ClicheM.D.   On: 05/16/2014 09:56   Ct Abdomen Pelvis W Contrast  05/16/2014   CLINICAL DATA:  Metastatic renal cell carcinoma for restaging  EXAM: CT CHEST, ABDOMEN, AND PELVIS WITH  CONTRAST  TECHNIQUE: Multidetector CT imaging of the chest, abdomen and pelvis was performed following the standard protocol during bolus administration of intravenous contrast.  CONTRAST:  1023mOMNIPAQUE IOHEXOL 300 MG/ML  SOLN  COMPARISON:  January 20, 2014 CT scan ; September 20, 2013 CT scan  FINDINGS: CT CHEST FINDINGS  Thoracic inlet is normal. Thyroid is normal. There is no significant mediastinal or left hilar adenopathy. There are no pleural effusions. The left lung is clear. Old superior posterior right rib fracture, healed, is stable.  Architectural distortion right upper lobe is stable. Architectural distortion medial right lower lobe is stable. Again identified is a right perihilar lesion seen anterior to the right hilum, today measuring 24 x 20 mm, previously measuring  21 x 17 mm. On image number 26 there is a 4 mm pulmonary nodule laterally in the right upper lobe which is stable from January 20, 2014.  CT ABDOMEN AND PELVIS FINDINGS  The liver, gallbladder, spleen, and adrenal glands are normal. A tiny low-attenuation lesion lower pole right kidney is well below a cm in size and too small to characterize, but stable. The left kidney is surgically absent.  There is no significant mesenteric or retroperitoneal adenopathy. There is no ascites. The abdominal aorta is not dilated.  There is a nonobstructive bowel gas pattern. Bowel and appendix are normal. Bladder and reproductive organs are normal. There are no acute or concerning musculoskeletal abnormalities in the abdomen and pelvis.  There are multiple hypervascular pancreatic lesions. Most inferiorly in the pancreatic head, there is a 12 x 10 mm enhancing lesion that previously measured 16 x 12 mm. More proximally in the neck of the pancreas, there is an enhancing lesion that measures 12 x 9 mm as compared to 16 x 12 mm previously. In the body of the pancreas there is a hyper attenuating 28 x 17 mm lesion that previously measured 30 x 17 mm. There  are multiple dilatations of the pancreatic duct, up to a diameter of 11 mm, similar to the prior study.  IMPRESSION: 1. Slight interval increase in the size of the right lung lesion from 21 x 17 to 24 x 20 mm. 2. Numerous hypervascular pancreatic lesions, all showing interval mild decrease in size. Similar associated pancreatic ductal dilatation.   Electronically Signed   By: Skipper Cliche M.D.   On: 05/16/2014 09:56    Impression: Patient is a 49 year old man with painful right sacral metastasis from renal cell carcinoma. His current site of pain and S1 dermatome symptoms appear to implicate his prievously treated sacral metastasis. Further radiation to this area may carry high risks. His upcoming MRI on 7/20 may help delineate a possible target area for radiation. If we decide against further radiation, we can also potentially consider referral for other local treatments such as RFA with interventional radiology.   Plan:  I will follow up with patient after I review the results from his MRI Wednesday.   _____________________________________  Sheral Apley. Tammi Klippel, M.D.   This document serves as a record of services personally performed by Tyler Pita, MD. It was created on his behalf by Derek Mound, a trained medical scribe. The creation of this record is based on the scribe's personal observations and the provider's statements to them. This document has been checked and approved by the attending provider.

## 2015-07-09 NOTE — Progress Notes (Signed)
See progress note under physician encounter. 

## 2015-07-10 ENCOUNTER — Other Ambulatory Visit: Payer: Self-pay | Admitting: *Deleted

## 2015-07-10 DIAGNOSIS — F419 Anxiety disorder, unspecified: Secondary | ICD-10-CM

## 2015-07-10 DIAGNOSIS — C649 Malignant neoplasm of unspecified kidney, except renal pelvis: Secondary | ICD-10-CM

## 2015-07-10 DIAGNOSIS — C7951 Secondary malignant neoplasm of bone: Secondary | ICD-10-CM

## 2015-07-10 DIAGNOSIS — K8689 Other specified diseases of pancreas: Secondary | ICD-10-CM

## 2015-07-10 MED ORDER — DULOXETINE HCL 60 MG PO CPEP: 60.0000 mg | ORAL_CAPSULE | Freq: Two times a day (BID) | ORAL | Status: DC

## 2015-07-10 MED ORDER — GABAPENTIN 300 MG PO CAPS: 300.0000 mg | ORAL_CAPSULE | Freq: Three times a day (TID) | ORAL | Status: DC

## 2015-07-12 ENCOUNTER — Other Ambulatory Visit: Payer: Self-pay | Admitting: Radiation Oncology

## 2015-07-12 DIAGNOSIS — C7951 Secondary malignant neoplasm of bone: Secondary | ICD-10-CM

## 2015-07-13 ENCOUNTER — Telehealth: Payer: Self-pay | Admitting: Oncology

## 2015-07-13 ENCOUNTER — Ambulatory Visit (HOSPITAL_BASED_OUTPATIENT_CLINIC_OR_DEPARTMENT_OTHER): Payer: 59

## 2015-07-13 ENCOUNTER — Encounter: Payer: Self-pay | Admitting: Physician Assistant

## 2015-07-13 ENCOUNTER — Other Ambulatory Visit (HOSPITAL_BASED_OUTPATIENT_CLINIC_OR_DEPARTMENT_OTHER): Payer: 59

## 2015-07-13 ENCOUNTER — Ambulatory Visit (HOSPITAL_BASED_OUTPATIENT_CLINIC_OR_DEPARTMENT_OTHER): Payer: 59 | Admitting: Physician Assistant

## 2015-07-13 VITALS — BP 146/96 | HR 92 | Temp 98.7°F | Resp 19 | Ht 75.0 in | Wt 199.5 lb

## 2015-07-13 DIAGNOSIS — R634 Abnormal weight loss: Secondary | ICD-10-CM

## 2015-07-13 DIAGNOSIS — C649 Malignant neoplasm of unspecified kidney, except renal pelvis: Secondary | ICD-10-CM

## 2015-07-13 DIAGNOSIS — I10 Essential (primary) hypertension: Secondary | ICD-10-CM

## 2015-07-13 DIAGNOSIS — C642 Malignant neoplasm of left kidney, except renal pelvis: Secondary | ICD-10-CM

## 2015-07-13 DIAGNOSIS — C7889 Secondary malignant neoplasm of other digestive organs: Secondary | ICD-10-CM

## 2015-07-13 DIAGNOSIS — C7801 Secondary malignant neoplasm of right lung: Secondary | ICD-10-CM | POA: Diagnosis not present

## 2015-07-13 DIAGNOSIS — Z5112 Encounter for antineoplastic immunotherapy: Secondary | ICD-10-CM

## 2015-07-13 DIAGNOSIS — F419 Anxiety disorder, unspecified: Secondary | ICD-10-CM

## 2015-07-13 DIAGNOSIS — C7951 Secondary malignant neoplasm of bone: Secondary | ICD-10-CM | POA: Diagnosis not present

## 2015-07-13 DIAGNOSIS — M792 Neuralgia and neuritis, unspecified: Secondary | ICD-10-CM

## 2015-07-13 LAB — CBC WITH DIFFERENTIAL/PLATELET
BASO%: 0.2 % (ref 0.0–2.0)
Basophils Absolute: 0 10*3/uL (ref 0.0–0.1)
EOS%: 2.5 % (ref 0.0–7.0)
Eosinophils Absolute: 0.2 10*3/uL (ref 0.0–0.5)
HCT: 42.4 % (ref 38.4–49.9)
HGB: 13.9 g/dL (ref 13.0–17.1)
LYMPH%: 24.8 % (ref 14.0–49.0)
MCH: 34.9 pg — ABNORMAL HIGH (ref 27.2–33.4)
MCHC: 32.8 g/dL (ref 32.0–36.0)
MCV: 106.5 fL — ABNORMAL HIGH (ref 79.3–98.0)
MONO#: 0.6 10*3/uL (ref 0.1–0.9)
MONO%: 7.3 % (ref 0.0–14.0)
NEUT#: 5.3 10*3/uL (ref 1.5–6.5)
NEUT%: 65.2 % (ref 39.0–75.0)
Platelets: 154 10*3/uL (ref 140–400)
RBC: 3.98 10*6/uL — ABNORMAL LOW (ref 4.20–5.82)
RDW: 14.1 % (ref 11.0–14.6)
WBC: 8.1 10*3/uL (ref 4.0–10.3)
lymph#: 2 10*3/uL (ref 0.9–3.3)

## 2015-07-13 LAB — COMPREHENSIVE METABOLIC PANEL (CC13)
ALT: 83 U/L — ABNORMAL HIGH (ref 0–55)
AST: 35 U/L — ABNORMAL HIGH (ref 5–34)
Albumin: 3.4 g/dL — ABNORMAL LOW (ref 3.5–5.0)
Alkaline Phosphatase: 117 U/L (ref 40–150)
Anion Gap: 8 mEq/L (ref 3–11)
BUN: 13.5 mg/dL (ref 7.0–26.0)
CO2: 31 mEq/L — ABNORMAL HIGH (ref 22–29)
Calcium: 9.7 mg/dL (ref 8.4–10.4)
Chloride: 98 mEq/L (ref 98–109)
Creatinine: 1.1 mg/dL (ref 0.7–1.3)
EGFR: 76 mL/min/{1.73_m2} — ABNORMAL LOW (ref >90)
Glucose: 176 mg/dl — ABNORMAL HIGH (ref 70–140)
Potassium: 3.9 mEq/L (ref 3.5–5.1)
Sodium: 136 mEq/L (ref 136–145)
Total Bilirubin: 0.38 mg/dL (ref 0.20–1.20)
Total Protein: 7.2 g/dL (ref 6.4–8.3)

## 2015-07-13 MED ORDER — ALPRAZOLAM 1 MG PO TABS: 1.0000 mg | ORAL_TABLET | Freq: Three times a day (TID) | ORAL | Status: DC | PRN

## 2015-07-13 MED ORDER — SODIUM CHLORIDE 0.9 % IV SOLN
Freq: Once | INTRAVENOUS | Status: AC
  Administered 2015-07-13: 13:00:00 via INTRAVENOUS

## 2015-07-13 MED ORDER — SODIUM CHLORIDE 0.9 % IV SOLN
3.0000 mg/kg | Freq: Once | INTRAVENOUS | Status: AC
  Administered 2015-07-13: 280 mg via INTRAVENOUS
  Filled 2015-07-13: qty 28

## 2015-07-13 NOTE — Patient Instructions (Signed)
[facility] Discharge Instructions for Patients Receiving Chemotherapy  Today you received the following chemotherapy agents:  Nivolumab.  To help prevent nausea and vomiting after your treatment, we encourage you to take your nausea medication as directed.   If you develop nausea and vomiting that is not controlled by your nausea medication, call the clinic.   BELOW ARE SYMPTOMS THAT SHOULD BE REPORTED IMMEDIATELY:  *FEVER GREATER THAN 100.5 F  *CHILLS WITH OR WITHOUT FEVER  NAUSEA AND VOMITING THAT IS NOT CONTROLLED WITH YOUR NAUSEA MEDICATION  *UNUSUAL SHORTNESS OF BREATH  *UNUSUAL BRUISING OR BLEEDING  TENDERNESS IN MOUTH AND THROAT WITH OR WITHOUT PRESENCE OF ULCERS  *URINARY PROBLEMS  *BOWEL PROBLEMS  UNUSUAL RASH Items with * indicate a potential emergency and should be followed up as soon as possible.  Feel free to call the clinic you have any questions or concerns. The clinic phone number is [phone].  Please show the CHEMO ALERT CARD at check-in to the Emergency Department and triage nurse.   

## 2015-07-13 NOTE — Telephone Encounter (Signed)
Gave adn printed appt sched and avs for pt for Aug

## 2015-07-13 NOTE — Patient Instructions (Signed)
Follow up in 2 weeks, prior to your next scheduled cycle of immunotherapy 

## 2015-07-13 NOTE — Progress Notes (Signed)
Hematology and Oncology Follow Up Visit  Keith [name] [id number] 1966-02-11 49 y.o. 07/13/2015 4:08 PM  Keith Bring, MD  Keith [name], M.D.  Keith Bent, MD  Keith Banister, MD    Principle Diagnosis: This is a 49 year old gentleman with stage IV renal cell carcinoma diagnosed in 2009.  Prior Therapy: 1. Status post laparoscopic radical nephrectomy.  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  2. Patient status post thoracotomy for a synchronous metastatic lung lesions done October 2009.  He had a lower lobe nodule, biopsy proven to be metastatic renal cell carcinoma.   3. Patient is status post stereotactic radiotherapy to pulmonary nodules in May of 2010. 4. He is S/P Sutent 50 mg 4 weeks on 2 weeks off from 10/2010 to 03/2013. He progressed at that time.  5. He is S/P radiation to the right sacral bone between 4/22 to 4/30.  6. He is S/P XRT to the left shoulder 03/20/14 to 03/31/14. 7. Votrient 800 mg by mouth daily from 03/2013 through 06/22/2015. Discontinued secondary to disease progression   Current therapy: Nivolumab 3 mg/kg given every 2 weeks. Status post 1 cycle. His disease includes lung lesions, pancreatic as well as right sacral lesion.   Interim History:  Keith [name] presents today for a followup visit. Since his last visit, he reports having a subcutaneous pain medication pump placed. This device along with breakthrough delighted tablets and ibuprofen in addition to his other pain modalities allowed him to be relatively comfortable. He continues to use Xanax 3 times daily and requests a refill for this medication. He is currently being treated with immunotherapy in the form of Nivolumab 3 mg/kg given every 2 weeks. He status post 1 cycle. He tolerated the immunotherapy without difficulty. Specifically, denied any difficulty breathing, diarrhea, skin rashes, headaches, blurred or double vision. He does not report any neurological symptoms  associated with it. He still able to ambulate without any difficulties. He is able to drive and attends to activities of daily living.     He has not reported any episodes of nausea or vomiting. He is not reporting any constipation. Has not reported any recent hospitalizations or illnesses. He did not report any headaches blurred vision or double vision. Does not report any seizure activity or alteration of mental status. Is not reporting any chest pain or difficulty breathing. Is not reporting any cough or hemoptysis. Does not report any abdominal pain or hematochezia. Does not report any hematuria but does report occasional hesitancy and nocturia. He does not report any rashes or lesions or petechiae. He does not report any lymphadenopathy. His performance status and activity level remain relatively stable at this time. Remainder of his review of system is unremarkable.  Medications: I have reviewed the patient's current medications.  Current Outpatient Prescriptions  Medication Sig Dispense Refill  . ALPRAZolam (XANAX) 1 MG tablet Take 1 tablet (1 mg total) by mouth 3 (three) times daily as needed for anxiety. 100 tablet 0  . docusate sodium (COLACE) 100 MG capsule Take 100 mg by mouth 2 (two) times daily.    . DULoxetine (CYMBALTA) 60 MG capsule Take 1 capsule (60 mg total) by mouth 2 (two) times daily. 60 capsule 2  . gabapentin (NEURONTIN) 300 MG capsule Take 1 capsule (300 mg total) by mouth 3 (three) times daily. 90 capsule 3  . HYDROmorphone (DILAUDID) 8 MG tablet Take 1 tablet (8 mg total) by mouth every 4 (four) hours as needed  for moderate pain or severe pain. 50 tablet 0  . ibuprofen (ADVIL,MOTRIN) 800 MG tablet Take 800 mg by mouth every 8 (eight) hours as needed for moderate pain.     [name] senna (SENOKOT) 8.6 MG tablet Take 1 tablet by mouth daily.    [name] tiZANidine (ZANAFLEX) 4 MG tablet Take 4 mg by mouth every 8 (eight) hours as needed for muscle spasms.   0  . VIAGRA 100 MG tablet Take  50 mg by mouth as needed for erectile dysfunction.      No current facility-administered medications for this visit.     Allergies:  Allergies  Allergen Reactions  . Ceftriaxone Hives  . Hydrocodone Swelling    Past Medical History, Surgical history, Social history, and Family History were reviewed and updated.   Physical Exam: Blood pressure 146/96, pulse 92, temperature 98.7 F (37.1 C), temperature source Oral, resp. rate 19, height '6\' 3"'$  (1.905 m), weight 199 lb 8 oz (90.493 kg), SpO2 100 %. ECOG: 1 General appearance: alert and awake not in any distress. Head: Normocephalic, without obvious abnormality.  Neck: no adenopathy, no masses.  Lymph nodes: Cervical, supraclavicular, and axillary nodes normal. Heart:regular rate and rhythm, S1, S2.  Lung:chest clear, no wheezing, rales, normal symmetric air entry. No dullness to percussion.  Abdomen: soft, non-tender, without masses or organomegaly EXT:no edema, no despumation. No erythema noted.   Neuro: no focal deficits noted. No weakness noted in his lower extremities.  Lab Results: Lab Results  Component Value Date   WBC 8.1 07/13/2015   HGB 13.9 07/13/2015   HCT 42.4 07/13/2015   MCV 106.5* 07/13/2015   PLT 154 07/13/2015   EXAM: CT CHEST, ABDOMEN, AND PELVIS WITH CONTRAST  TECHNIQUE: Multidetector CT imaging of the chest, abdomen and pelvis was performed following the standard protocol during bolus administration of intravenous contrast.  CONTRAST: 19m OMNIPAQUE IOHEXOL 300 MG/ML SOLN  COMPARISON: CT scan 12/29/2014  FINDINGS: CT CHEST FINDINGS  Chest wall: No chest wall mass, supraclavicular or axillary lymphadenopathy. The thyroid gland appears normal. The bony thorax is intact. No destructive bone lesions or spinal canal compromise. Evidence of remote right-sided thoracotomy with heel rib fractures.  Mediastinum: The heart is normal in size. No pericardial effusion. The aorta is normal in  caliber. No dissection. The branch vessels are patent. No enlarged mediastinal lymph nodes. Slight progression of left hilar lymphadenopathy when compared to the prior examination. This measures 24.5 x 15 mm on image number 50 and previously measured 20 x 9 mm. The esophagus is normal.  Lungs/ pleura: Stable advanced emphysematous changes. Stable dense scarring changes in the right long likely a combination of surgery and radiation. Slightly progressive nodular soft tissue density in the right middle lobe surrounding the right middle lobe bronchi. This measures 26 x 24.5 mm on image number 66 and previously measured 22 x 14 mm.  There are few small, sub 3 mm subpleural pulmonary nodules which are stable.  No new pulmonary nodules. No pleural effusion.  CT ABDOMEN AND PELVIS FINDINGS  Hepatobiliary: No focal hepatic lesions to suggest hepatic metastatic disease. No intrahepatic biliary dilatation. The portal and hepatic veins are patent. The gallbladder is normal. No common bile duct dilatation.  Pancreas: Slight interval enlargement of pancreatic lesions. The enhancing lesion in the pancreatic head measures 26 x 24 mm on image number 117 and previously measured 23 x 16.5 mm. The pancreatic body lesion is also slightly larger measuring approximately 38 x 19 mm and previously measuring  27 x 19 mm. Stable areas of significant segmental dilatation of the main pancreatic duct. Moderate atrophy of the pancreatic tail.  Spleen: Normal size. No focal lesions.  Adrenals/Urinary Tract: The adrenal glands are normal.  The left kidney is surgically absent. The right kidney is normal.  Stomach/Bowel: The stomach, duodenum, small bowel and colon are unremarkable. No inflammatory changes, mass lesions or obstructive findings.  Vascular/Lymphatic: The aorta and branch vessels are patent. The major venous structures are patent. Small scattered mesenteric and retroperitoneal  lymph nodes but no mass or adenopathy.  Other: The bladder, prostate gland and seminal vesicles are unremarkable. No pelvic mass or adenopathy. No free pelvic fluid collections. No inguinal mass or adenopathy.  Musculoskeletal: Enlarging right sacral lytic lesion which measures 35.5 x 36.5 mm on image number 182. It previously measured 30 x 23 mm.  No new bone lesions.  IMPRESSION: 1. Interval slight progression of disease in the chest as described above. Enlarging left hilar adenopathy and right middle lobe peribronchial lesion. 2. Stable surgical and radiation changes in the chest. No new pulmonary nodules and no mediastinal adenopathy. 3. Stable emphysematous changes and pulmonary scarring. 4. Slight interval enlargement of the pancreatic head and body masses. Persistent pancreatic ductal dilatation and pancreatic tail atrophy. 5. Status post left nephrectomy. No mass in the left nephrectomy bed and no retroperitoneal adenopathy. 6. Enlarging right sacral lytic lesion. No new bone lesions are identified.   Impression and Plan:  This is a pleasant 49 year old gentleman with the following issues. 1. Metastatic renal cell carcinoma.  He has documented disease to the lung and the pancreas.  He tolerating Votrient without complications. CT scan from 06/20/2015 was reviewed today and showed progression of disease. His disease progressed in the right sacral mass as well as his pancreatic mass. Options of treatment were discussed today with different salvage therapies. These options would include immunotherapy in the form of Nivolumab  versus a different oral agent that includes Inlyta or Cabozanitinb. Risks and benefits of all these approaches were discussed with the patient today. Dr. [name] felt that he would benefit from immunotherapy at this time given the chronic nature of his illness and  might help altering the natural course of his disease. Combination associated with this  treatment were discussed. Immune associated complications such as pneumonitis, endocrinopathy among others were reviewed. He is now status post 1 cycle. He'll proceed with cycle #2 today as scheduled 2. Depression/Anxiety: He remains on Xanax and Cymbalta. His mood continues to be excellent. It a prescription for Xanax 1 mg tablets, one by mouth 3 times a day as needed for anxiety, 100 tablets written on 06/22/1999. I have written a refill prescription for the same directions in the same amount however with instructions not filled before 07/23/2015. 3. Weight loss: Likely related to progression of cancer. 4. Sacral mass: S/P radiation to that area and recently received an injection into that area. 5. Neuropathic pain. He is on gabapentin and Dilaudid for pain. He also has a subcutaneous pain medication pump. 6. Hypertension: Controlled at this time  7. Follow up: in 2 weeks prior to the start of cycle #3.   Wynetta Emery, Gavriella Hearst E, PA-C

## 2015-07-13 NOTE — Progress Notes (Signed)
OK to treat with ALT of 83 per Dr. [name]

## 2015-07-17 ENCOUNTER — Other Ambulatory Visit (HOSPITAL_COMMUNITY): Payer: Self-pay | Admitting: Interventional Radiology

## 2015-07-17 DIAGNOSIS — C7952 Secondary malignant neoplasm of bone marrow: Principal | ICD-10-CM

## 2015-07-17 DIAGNOSIS — C7951 Secondary malignant neoplasm of bone: Secondary | ICD-10-CM

## 2015-07-18 ENCOUNTER — Ambulatory Visit
Admission: RE | Admit: 2015-07-18 | Discharge: 2015-07-18 | Disposition: A | Discharge status: Home / Self Care | Payer: 59 | Source: Ambulatory Visit | Attending: Neurosurgery | Admitting: Neurosurgery

## 2015-07-18 ENCOUNTER — Other Ambulatory Visit: Payer: 59

## 2015-07-18 DIAGNOSIS — M5417 Radiculopathy, lumbosacral region: Secondary | ICD-10-CM

## 2015-07-24 ENCOUNTER — Ambulatory Visit (HOSPITAL_COMMUNITY)
Admission: RE | Admit: 2015-07-24 | Discharge: 2015-07-24 | Disposition: A | Discharge status: Home / Self Care | Payer: 59 | Source: Ambulatory Visit | Attending: Interventional Radiology | Admitting: Interventional Radiology

## 2015-07-25 IMAGING — CT CT CHEST W/ CM
2 of 11 series · 11 of 46 positions shown, 18 images · IV contrast (OMNIPAQUE)
Comparison: CT 06/17/2013.

CLINICAL DATA: Subsequent evaluation for personal history of renal
cell carcinoma Restaging.

EXAM:
CT CHEST, ABDOMEN, AND PELVIS WITH CONTRAST
TECHNIQUE: Multidetector CT imaging of the chest, abdomen and pelvis was
performed following the standard protocol during bolus
administration of intravenous contrast.
CONTRAST:  100mL OMNIPAQUE IOHEXOL 300 MG/ML  SOLN

[Series 6: venous · axial · portal-venous · 0.78mm/px · z∈[-444,+90]mm · 9 of 224 slices shown, 15 images]
[im 23/224  soft-tissue]
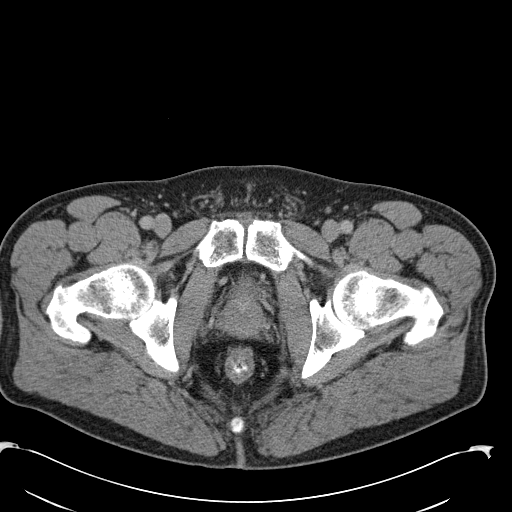
[im 23/224  bone]
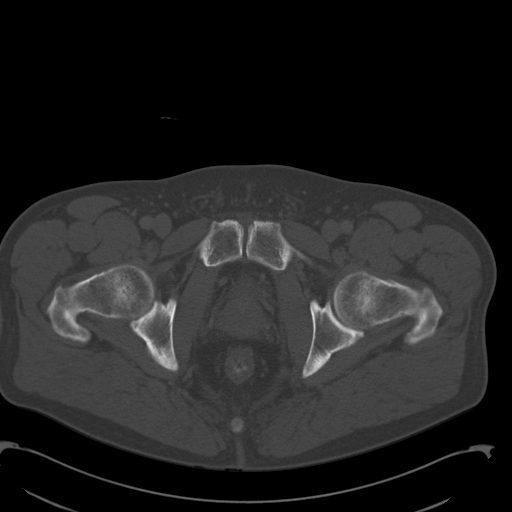
[im 45/224  soft-tissue]
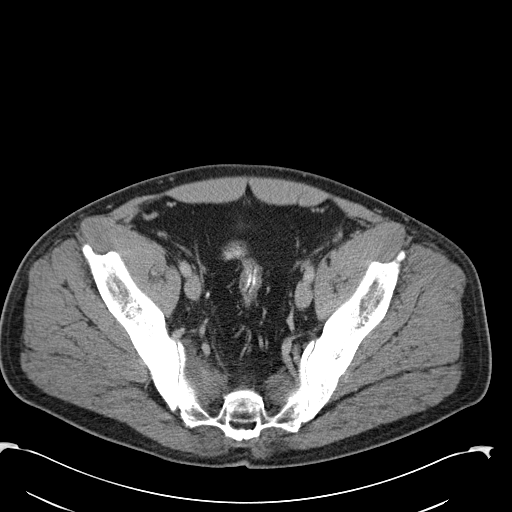
[im 67/224  soft-tissue]
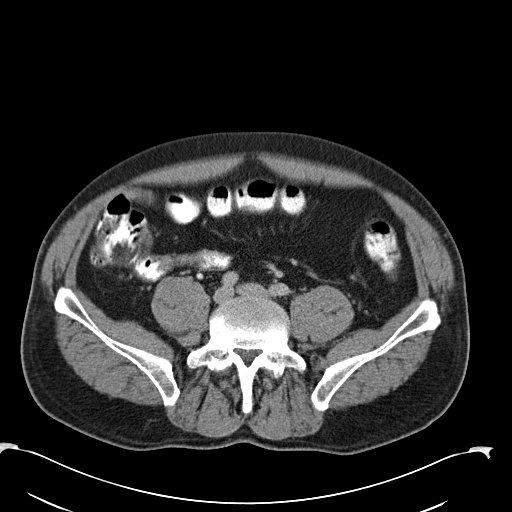
[im 90/224  soft-tissue]
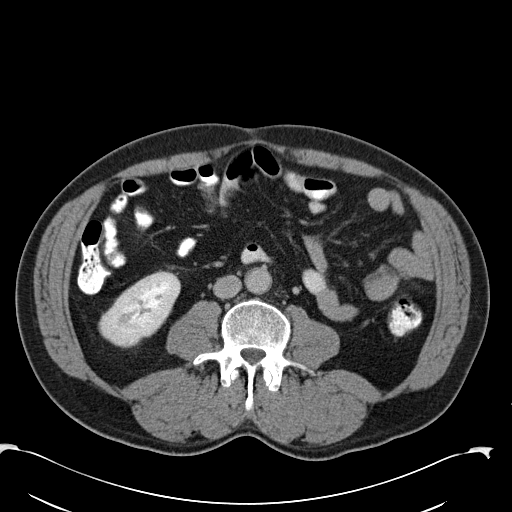
[im 112/224  soft-tissue]
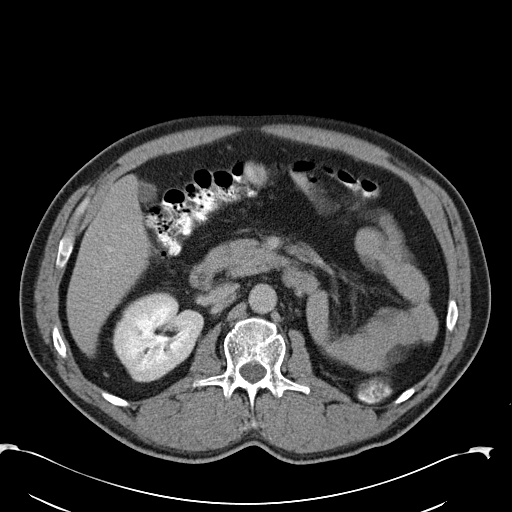
[im 134/224  soft-tissue]
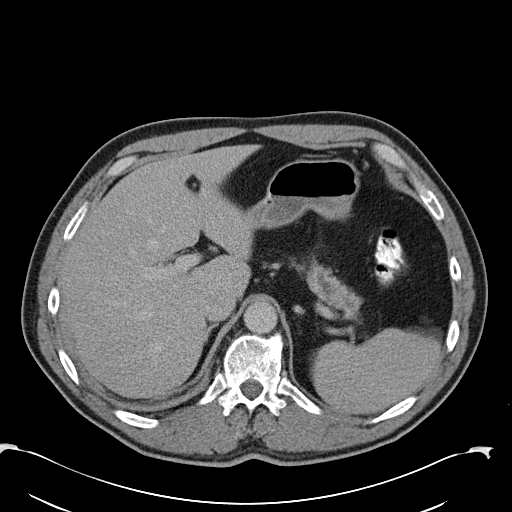
[im 134/224  lung]
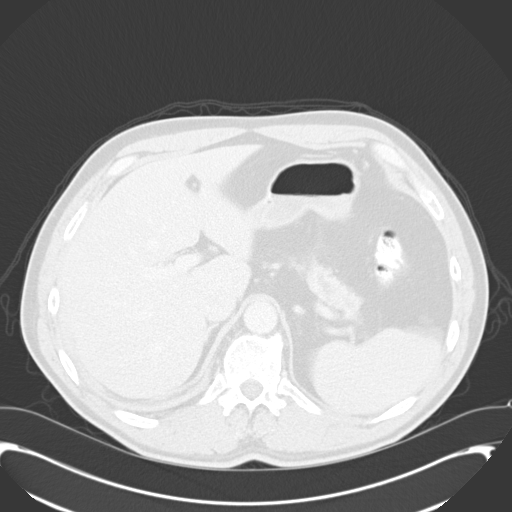
[im 157/224  soft-tissue]
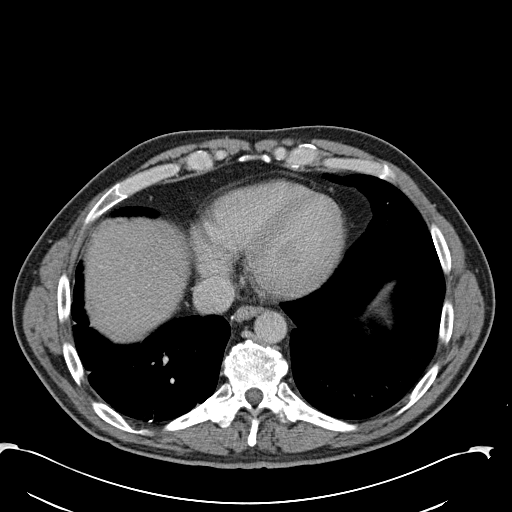
[im 157/224  lung]
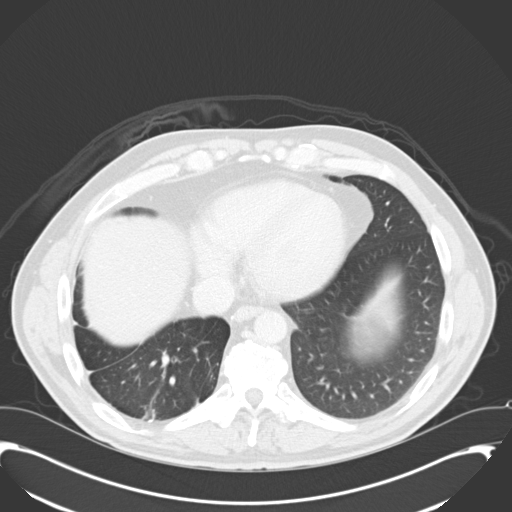
[im 179/224  soft-tissue]
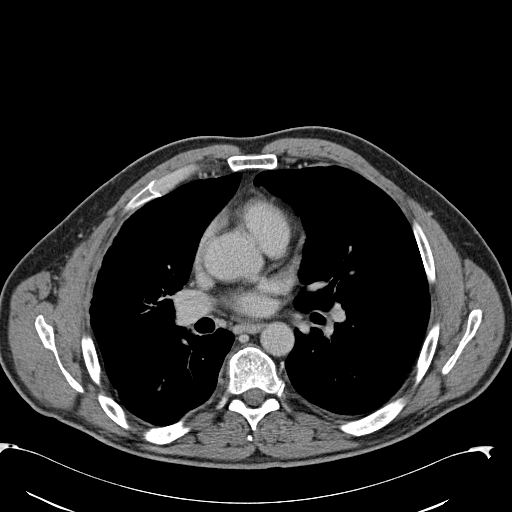
[im 179/224  lung]
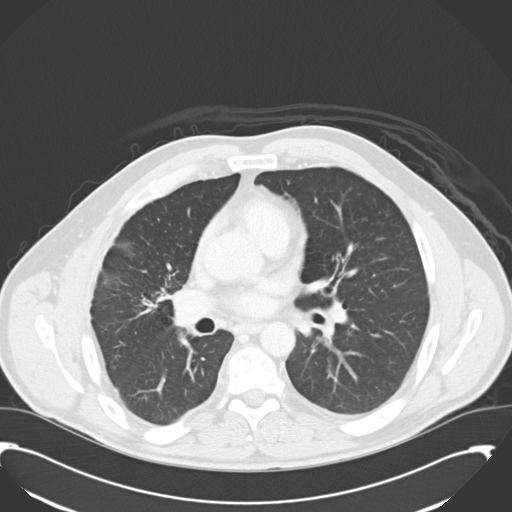
[im 201/224  soft-tissue]
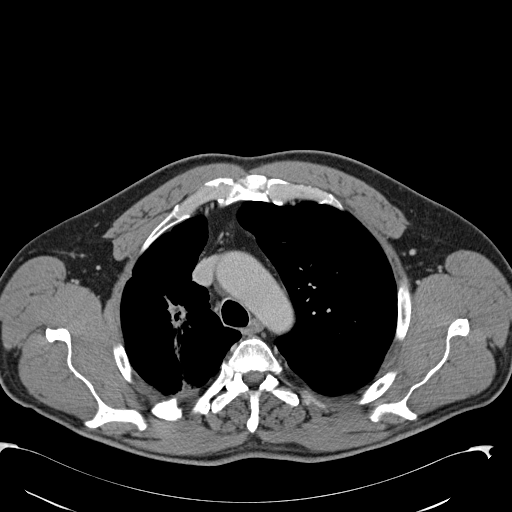
[im 201/224  lung]
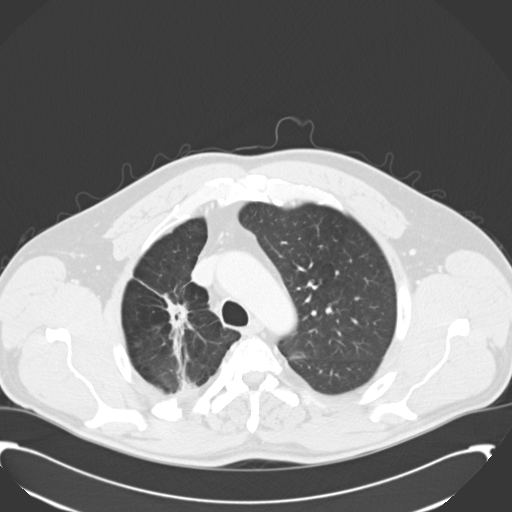
[im 201/224  bone]
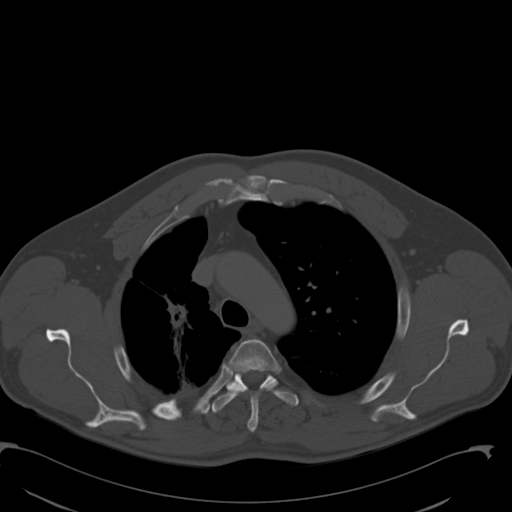

[Series 604: <mpr thick range(2)> · coronal · 0.78mm/px · 2 of 136 slices shown, 3 images]
[im 46/136  soft-tissue]
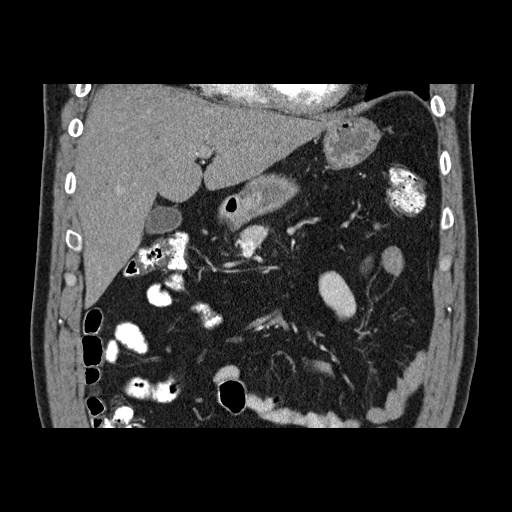
[im 46/136  bone]
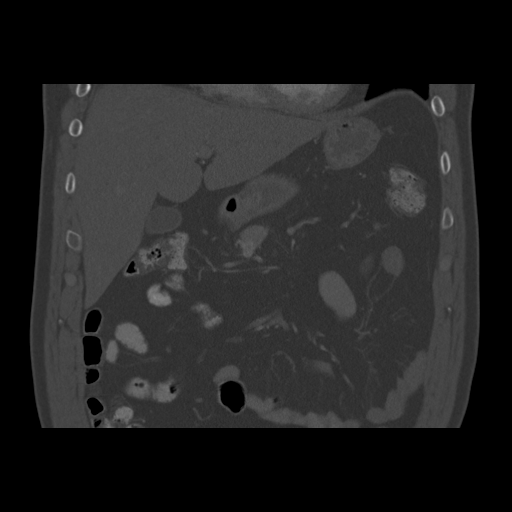
[im 91/136  soft-tissue]
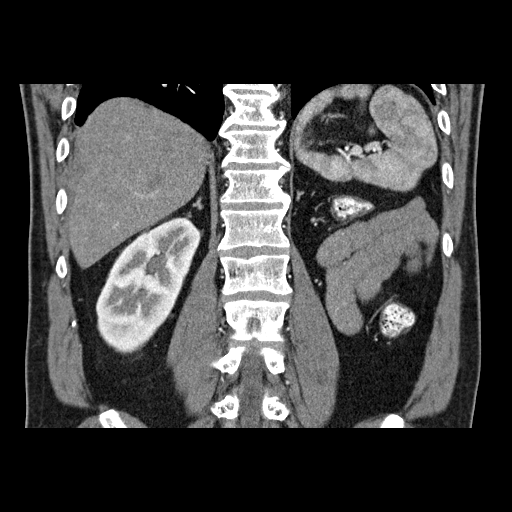

[11 of 46 positions shown; findings below may reference images not displayed]

FINDINGS: CT CHEST FINDINGS

No axillary or supraclavicular lymphadenopathy. No mediastinal or
hilar lymphadenopathy. Review of the lung parenchyma demonstrates
pleural-parenchymal thickening surrounding right hilum extending
into the right middle lobe and right lower lobe. The rounded aspect
of this thickening (measured on comparison exam) is unchanged
measuring 20 x 17 mm compared to 22 by 16 mm on prior remeasured
(image 51, series 9). There is new no new nodularity within the left
or right lung.

CT ABDOMEN AND PELVIS FINDINGS

No focal hepatic lesion. Post cholecystectomy. Again demonstrated
hypervascular lesions within the pancreas. The largest lesion in the
mid pancreas measures 2.8 by 2.0 cm compared to 3.0 x 1.8 cm on
prior for no change. Proximal to this lesion there is dilatation of
the pancreatic duct to 6 mm which is unchanged. Two additional
hypervascular lesions in the head of the pancreas measure 10 mm 11
mm (image 42, series 4) which are also unchanged.

Spleen and adrenal glands are normal. Status post left nephrectomy.
No nodularity in the left nephrectomy bed. Right kidney demonstrates
a small exophytic lesion extending from the lower pole measuring 11
mm (image 25, series 4) which is not changed from comparison exam.

The stomach, small bowel, appendix, and colon are unremarkable.
Abdominal aorta is normal in caliber. No retroperitoneal or
periportal lymphadenopathy.

Review of the skeleton demonstrates mild interval enlargement of the
lytic right sacral lesion measuring 30 mm x 24 mm compared to 26 x
23 mm on prior. No new lytic lesions are noted in review of the
skeleton.
IMPRESSION: CT CHEST IMPRESSION

1. No disease progression within the thorax.  Stable exam.

2. Peribronchial nodular thickening in the perihilar nodular
thickening in the right middle lobe and right lower lobe is
unchanged.

CT ABDOMEN AND PELVIS IMPRESSION

1. Stablehypervascular lesions in the pancreas with ductal
dilatation unchanged from prior.

2. Small enhancing lesion inferior pole of the right kidney is
unchanged.

3. Stable left nephrectomy bed without evidence of local recurrence.

4.  Mild interval increase in size of right sacral ala lytic lesion.

## 2015-07-26 ENCOUNTER — Ambulatory Visit
Admission: RE | Admit: 2015-07-26 | Discharge: 2015-07-26 | Disposition: A | Discharge status: Home / Self Care | Payer: 59 | Source: Ambulatory Visit | Attending: Neurosurgery | Admitting: Neurosurgery

## 2015-07-26 ENCOUNTER — Other Ambulatory Visit: Payer: 59

## 2015-07-26 MED ORDER — GADOBENATE DIMEGLUMINE 529 MG/ML IV SOLN: 19.0000 mL | Freq: Once | INTRAVENOUS | Status: AC | PRN

## 2015-07-27 ENCOUNTER — Ambulatory Visit (HOSPITAL_BASED_OUTPATIENT_CLINIC_OR_DEPARTMENT_OTHER): Payer: 59

## 2015-07-27 ENCOUNTER — Ambulatory Visit (HOSPITAL_BASED_OUTPATIENT_CLINIC_OR_DEPARTMENT_OTHER): Payer: 59 | Admitting: Oncology

## 2015-07-27 ENCOUNTER — Telehealth: Payer: Self-pay | Admitting: Oncology

## 2015-07-27 ENCOUNTER — Other Ambulatory Visit (HOSPITAL_BASED_OUTPATIENT_CLINIC_OR_DEPARTMENT_OTHER): Payer: 59

## 2015-07-27 ENCOUNTER — Telehealth: Payer: Self-pay | Admitting: *Deleted

## 2015-07-27 VITALS — BP 128/65 | HR 101 | Temp 98.2°F | Resp 18 | Ht 75.0 in | Wt 198.0 lb

## 2015-07-27 DIAGNOSIS — C649 Malignant neoplasm of unspecified kidney, except renal pelvis: Secondary | ICD-10-CM

## 2015-07-27 DIAGNOSIS — C7801 Secondary malignant neoplasm of right lung: Secondary | ICD-10-CM

## 2015-07-27 DIAGNOSIS — C7989 Secondary malignant neoplasm of other specified sites: Secondary | ICD-10-CM

## 2015-07-27 DIAGNOSIS — C7889 Secondary malignant neoplasm of other digestive organs: Secondary | ICD-10-CM

## 2015-07-27 DIAGNOSIS — Z5112 Encounter for antineoplastic immunotherapy: Secondary | ICD-10-CM

## 2015-07-27 DIAGNOSIS — C7951 Secondary malignant neoplasm of bone: Secondary | ICD-10-CM | POA: Diagnosis not present

## 2015-07-27 DIAGNOSIS — C642 Malignant neoplasm of left kidney, except renal pelvis: Secondary | ICD-10-CM

## 2015-07-27 DIAGNOSIS — R634 Abnormal weight loss: Secondary | ICD-10-CM

## 2015-07-27 DIAGNOSIS — F341 Dysthymic disorder: Secondary | ICD-10-CM

## 2015-07-27 DIAGNOSIS — M792 Neuralgia and neuritis, unspecified: Secondary | ICD-10-CM

## 2015-07-27 DIAGNOSIS — I10 Essential (primary) hypertension: Secondary | ICD-10-CM

## 2015-07-27 LAB — COMPREHENSIVE METABOLIC PANEL (CC13)
ALT: 35 U/L (ref 0–55)
AST: 15 U/L (ref 5–34)
Albumin: 3.6 g/dL (ref 3.5–5.0)
Alkaline Phosphatase: 115 U/L (ref 40–150)
Anion Gap: 8 mEq/L (ref 3–11)
BUN: 10 mg/dL (ref 7.0–26.0)
CO2: 30 mEq/L — ABNORMAL HIGH (ref 22–29)
Calcium: 9.8 mg/dL (ref 8.4–10.4)
Chloride: 100 mEq/L (ref 98–109)
Creatinine: 1.2 mg/dL (ref 0.7–1.3)
EGFR: 70 mL/min/{1.73_m2} — ABNORMAL LOW (ref >90)
Glucose: 198 mg/dl — ABNORMAL HIGH (ref 70–140)
Potassium: 4.1 mEq/L (ref 3.5–5.1)
Sodium: 138 mEq/L (ref 136–145)
Total Bilirubin: 0.42 mg/dL (ref 0.20–1.20)
Total Protein: 7.2 g/dL (ref 6.4–8.3)

## 2015-07-27 LAB — CBC WITH DIFFERENTIAL/PLATELET
BASO%: 0.4 % (ref 0.0–2.0)
Basophils Absolute: 0 10*3/uL (ref 0.0–0.1)
EOS%: 2.1 % (ref 0.0–7.0)
Eosinophils Absolute: 0.2 10*3/uL (ref 0.0–0.5)
HCT: 42.2 % (ref 38.4–49.9)
HGB: 13.9 g/dL (ref 13.0–17.1)
LYMPH%: 21.1 % (ref 14.0–49.0)
MCH: 34.8 pg — ABNORMAL HIGH (ref 27.2–33.4)
MCHC: 32.9 g/dL (ref 32.0–36.0)
MCV: 105.8 fL — ABNORMAL HIGH (ref 79.3–98.0)
MONO#: 0.6 10*3/uL (ref 0.1–0.9)
MONO%: 7.3 % (ref 0.0–14.0)
NEUT#: 5.9 10*3/uL (ref 1.5–6.5)
NEUT%: 69.1 % (ref 39.0–75.0)
Platelets: 214 10*3/uL (ref 140–400)
RBC: 3.99 10*6/uL — ABNORMAL LOW (ref 4.20–5.82)
RDW: 14.6 % (ref 11.0–14.6)
WBC: 8.5 10*3/uL (ref 4.0–10.3)
lymph#: 1.8 10*3/uL (ref 0.9–3.3)

## 2015-07-27 MED ORDER — SODIUM CHLORIDE 0.9 % IV SOLN
Freq: Once | INTRAVENOUS | Status: AC
  Administered 2015-07-27: 10:00:00 via INTRAVENOUS

## 2015-07-27 MED ORDER — SODIUM CHLORIDE 0.9 % IV SOLN
3.0000 mg/kg | Freq: Once | INTRAVENOUS | Status: AC
  Administered 2015-07-27: 280 mg via INTRAVENOUS
  Filled 2015-07-27: qty 28

## 2015-07-27 NOTE — Patient Instructions (Signed)
[facility] Discharge Instructions for Patients Receiving Chemotherapy  Today you received the following chemotherapy agents:  Nivolumab.  To help prevent nausea and vomiting after your treatment, we encourage you to take your nausea medication as directed.   If you develop nausea and vomiting that is not controlled by your nausea medication, call the clinic.   BELOW ARE SYMPTOMS THAT SHOULD BE REPORTED IMMEDIATELY:  *FEVER GREATER THAN 100.5 F  *CHILLS WITH OR WITHOUT FEVER  NAUSEA AND VOMITING THAT IS NOT CONTROLLED WITH YOUR NAUSEA MEDICATION  *UNUSUAL SHORTNESS OF BREATH  *UNUSUAL BRUISING OR BLEEDING  TENDERNESS IN MOUTH AND THROAT WITH OR WITHOUT PRESENCE OF ULCERS  *URINARY PROBLEMS  *BOWEL PROBLEMS  UNUSUAL RASH Items with * indicate a potential emergency and should be followed up as soon as possible.  Feel free to call the clinic you have any questions or concerns. The clinic phone number is [phone].  Please show the CHEMO ALERT CARD at check-in to the Emergency Department and triage nurse.   

## 2015-07-27 NOTE — Telephone Encounter (Signed)
per pof to sch pt appt-gave pt copy ogf avs-sent MW email to sch pt trmt-pt aware of appt-stated he will review MY CHART no need to call

## 2015-07-27 NOTE — Progress Notes (Signed)
Hematology and Oncology Follow Up Visit  Keith [name] [id number] [birth year] 49 y.o. 07/27/2015 8:55 AM  [name], MD  [name], M.D.  [name], MD  [name], MD    Principle Diagnosis: This is a 49 year old gentleman with stage IV renal cell carcinoma diagnosed in 2009.  Prior Therapy: 1. Status post laparoscopic radical nephrectomy.  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  2. Patient status post thoracotomy for a synchronous metastatic lung lesions done October 2009.  He had a lower lobe nodule, biopsy proven to be metastatic renal cell carcinoma.   3. Patient is status post stereotactic radiotherapy to pulmonary nodules in May of 2010. 4. He is S/P Sutent 50 mg 4 weeks on 2 weeks off from 10/2010 to 03/2013. He progressed at that time.  5. He is S/P radiation to the right sacral bone between 4/22 to 4/30.  6. He is S/P XRT to the left shoulder 03/20/14 to 03/31/14. 7. Votrient 800 mg by mouth daily from 03/2013 through 06/22/2015. Discontinued secondary to disease progression   Current therapy: Nivolumab 3 mg/kg given every 2 weeks started on 06/29/2015. He is here for cycle 3.  Interim History:  Keith [name] presents today for a followup visit. Since his last visit, he reports no major complaints related to systemic therapy. He continues to have a right-sided hip pain and numbness that is radiating to the bottom of his foot. He has been evaluated by Dr. Tammi Klippel and felt that that might be difficult to see if further radiation. He is referred to interventional radiology for possible radiofrequency ablation. He continues to benefit from the subcutaneous pain medication pump as well as Dilaudid breakthrough. He is currently tolerating immunotherapy in the form of Nivolumab 3 mg/kg given every 2 weeks. He denied any difficulty breathing, diarrhea, skin rashes, headaches, blurred or double vision. He does not report any neurological symptoms  associated with it. He still able to ambulate without any difficulties. He is able to drive and attends to activities of daily living.     He has not reported any episodes of nausea or vomiting. He is not reporting any constipation. Has not reported any recent hospitalizations or illnesses. He did not report any headaches blurred vision or double vision. Does not report any seizure activity or alteration of mental status. Is not reporting any chest pain or difficulty breathing. Is not reporting any cough or hemoptysis. Does not report any abdominal pain or hematochezia. Does not report any hematuria but does report occasional hesitancy and nocturia. He does not report any rashes or lesions or petechiae. He does not report any lymphadenopathy. His performance status and activity level remain relatively stable at this time. Remainder of his review of system is unremarkable.  Medications: I have reviewed the patient's current medications.  Current Outpatient Prescriptions  Medication Sig Dispense Refill  . ALPRAZolam (XANAX) 1 MG tablet Take 1 tablet (1 mg total) by mouth 3 (three) times daily as needed for anxiety. 100 tablet 0  . docusate sodium (COLACE) 100 MG capsule Take 100 mg by mouth 2 (two) times daily.    . DULoxetine (CYMBALTA) 60 MG capsule Take 1 capsule (60 mg total) by mouth 2 (two) times daily. 60 capsule 2  . gabapentin (NEURONTIN) 300 MG capsule Take 1 capsule (300 mg total) by mouth 3 (three) times daily. 90 capsule 3  . HYDROmorphone (DILAUDID) 8 MG tablet Take 1 tablet (8 mg total) by mouth every  4 (four) hours as needed for moderate pain or severe pain. 50 tablet 0  . ibuprofen (ADVIL,MOTRIN) 800 MG tablet Take 800 mg by mouth every 8 (eight) hours as needed for moderate pain.     [name] senna (SENOKOT) 8.6 MG tablet Take 1 tablet by mouth daily.    [name] tiZANidine (ZANAFLEX) 4 MG tablet Take 4 mg by mouth every 8 (eight) hours as needed for muscle spasms.   0  . VIAGRA 100 MG tablet Take  50 mg by mouth as needed for erectile dysfunction.      No current facility-administered medications for this visit.     Allergies:  Allergies  Allergen Reactions  . Ceftriaxone Hives  . Hydrocodone Swelling    Past Medical History, Surgical history, Social history, and Family History were reviewed and updated.   Physical Exam: Blood pressure 128/65, pulse 101, temperature 98.2 F (36.8 C), temperature source Oral, resp. rate 18, height '6\' 3"'$  (1.905 m), weight 198 lb (89.812 kg), SpO2 95 %. ECOG: 1 General appearance: alert and awake well-appearing gentleman not in any distress.  Head: Normocephalic, without obvious abnormality.  Neck: no adenopathy, no masses.  Lymph nodes: Cervical, supraclavicular, and axillary nodes normal. Heart:regular rate and rhythm, S1, S2.  Lung:chest clear, no wheezing, rales, normal symmetric air entry. No dullness to percussion.  Abdomen: soft, non-tender, without masses or organomegaly EXT:no edema, no despumation. No erythema noted.   Neuro: no focal deficits noted. No weakness noted in his lower extremities.  Lab Results: Lab Results  Component Value Date   WBC 8.5 07/27/2015   HGB 13.9 07/27/2015   HCT 42.2 07/27/2015   MCV 105.8* 07/27/2015   PLT 214 07/27/2015   EXAM: MRI LUMBAR SPINE WITHOUT AND WITH CONTRAST  TECHNIQUE: Multiplanar and multiecho pulse sequences of the lumbar spine were obtained without and with intravenous contrast.  CONTRAST: 19 mL MultiHance in conjunction with contrast enhanced imaging of the pelvis reported separately.  COMPARISON: Pelvis MRI from today reported separately. Total spine MRI 12/25/2014. CT Abdomen and Pelvis 06/20/2015.  FINDINGS: Transitional lumbosacral anatomy, same numbering system as on 12/25/2014.  Partially visible abnormal right sacral ala, and abnormal T1 signal hair appears progressed since January (series 7, image 33). The right S2 neural foramen and exiting nerve  appear affected (series 6, image 3).  Bone marrow signal in the lumbar spine appears stable since January. No lumbar metastasis identified. Lumbar vertebral height and alignment are stable.  Visualized lower thoracic spinal cord is normal with conus medularis at L1-L2. Cauda equina nerve roots appear within normal limits. No abnormal intradural enhancement identified.  No significant lumbar spinal stenosis. Chronic disc degeneration at L5-S1.  Stable visualized abdominal viscera.  IMPRESSION: 1. 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 See pelvis MRI from today reported separately. 2. No lumbar metastatic disease identified. Note transitional anatomy, same numbering system utilized as on 12/25/2014.   Electronically Signed  By: Genevie Ann M.D.  On: 07/26/2015 10:26           Vitals     Height Weight BMI (Calculated)    '6\' 3"'$  (1.905 m) 198 lb (89.812 kg) 24.8      Interpretation Summary     CLINICAL DATA: 49 year old male with metastatic renal cell carcinoma currently on chemotherapy. Lumbosacral radiculopathy. Status post radiation treatment of right sacral oligometastatic deposit to 50 gray in 5 fractions. Subsequent encounter.  EXAM: MRI LUMBAR SPINE WITHOUT AND WITH CONTRAST  TECHNIQUE: Multiplanar  and multiecho pulse sequences of the lumbar spine were obtained without and with intravenous contrast.  CONTRAST: 19 mL MultiHance in conjunction with contrast enhanced imaging of the pelvis reported separately.  COMPARISON: Pelvis MRI from today reported separately. Total spine MRI 12/25/2014. CT Abdomen and Pelvis 06/20/2015.  FINDINGS: Transitional lumbosacral anatomy, same numbering system as on 12/25/2014.  Partially visible abnormal right sacral ala, and abnormal T1 signal hair appears progressed since January (series 7, image 33). The right S2 neural foramen and exiting  nerve appear affected (series 6, image 3).  Bone marrow signal in the lumbar spine appears stable since January. No lumbar metastasis identified. Lumbar vertebral height and alignment are stable.  Visualized lower thoracic spinal cord is normal with conus medularis at L1-L2. Cauda equina nerve roots appear within normal limits. No abnormal intradural enhancement identified.  No significant lumbar spinal stenosis. Chronic disc degeneration at L5-S1.  Stable visualized abdominal viscera.  IMPRESSION: 1. 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 See pelvis MRI from today reported separately. 2. No lumbar metastatic disease identified. Note transitional anatomy, same numbering system utilized as on 12/25/2014.     EXAM: MRI PELVIS WITHOUT AND WITH CONTRAST  TECHNIQUE: Multiplanar multisequence MR imaging of the pelvis was performed both before and after administration of intravenous contrast.  CONTRAST: 19 ml MultiHance.  COMPARISON: Radiographs 06/19/2015, CT 12/29/2014, bone scan 04/05/2013 and MRI 03/09/2013.  FINDINGS: Bones: Both femoral heads appear normal without evidence of acute fracture, dislocation or avascular necrosis. There is a new enhancing 1.8 cm endosteal lesion anteriorly in the intertrochanteric region of the proximal right femur consistent with a metastasis. The known right sacral metastasis has enlarged compared with the previous MRI. This currently measures 3.2 x 4.2 x 4.0 cm and demonstrates heterogeneous enhancement following contrast. The right femoral lesion is associated with endosteal thinning. There is no evidence of pathologic fracture. No other osseous metastases identified. The sacroiliac joints and symphysis pubis appear normal.  Articular cartilage and labrum  Articular cartilage: No focal chondral defect or subchondral signal abnormality identified.  Labrum: Mild  labral degeneration, greater on the right. No gross labral tear.  Joint or bursal effusion  Joint effusion: No significant hip joint effusion.  Bursae: No focal periarticular fluid collection.  Muscles and tendons  Muscles and tendons: The gluteus, hamstring and iliopsoas tendons appear normal. The piriformis muscles appear symmetric.  Other findings  Miscellaneous: The visualized internal pelvic contents appear unremarkable.  IMPRESSION: 1. Enlarging right sacral osseous metastasis compared with previous MRI from 2014. 2. New metastasis in the anterior intertrochanteric region of the right femur. 3. No evidence of pathologic fracture or extraosseous metastatic disease.   Electronically Signed  By: Richardean Sale M.D.  On: 07/26/2015 10:56           Vitals     Height Weight BMI (Calculated)    '6\' 3"'$  (1.905 m) 198 lb (89.812 kg) 24.8      Interpretation Summary     CLINICAL DATA: Metastatic renal cell carcinoma for follow up. Currently undergoing chemotherapy. Subsequent encounter)  EXAM: MRI PELVIS WITHOUT AND WITH CONTRAST  TECHNIQUE: Multiplanar multisequence MR imaging of the pelvis was performed both before and after administration of intravenous contrast.  CONTRAST: 19 ml MultiHance.  COMPARISON: Radiographs 06/19/2015, CT 12/29/2014, bone scan 04/05/2013 and MRI 03/09/2013.  FINDINGS: Bones: Both femoral heads appear normal without evidence of acute fracture, dislocation or avascular necrosis. There is a new enhancing 1.8 cm endosteal lesion  anteriorly in the intertrochanteric region of the proximal right femur consistent with a metastasis. The known right sacral metastasis has enlarged compared with the previous MRI. This currently measures 3.2 x 4.2 x 4.0 cm and demonstrates heterogeneous enhancement following contrast. The right femoral lesion is associated with endosteal thinning. There is no evidence of  pathologic fracture. No other osseous metastases identified. The sacroiliac joints and symphysis pubis appear normal.  Articular cartilage and labrum  Articular cartilage: No focal chondral defect or subchondral signal abnormality identified.  Labrum: Mild labral degeneration, greater on the right. No gross labral tear.  Joint or bursal effusion  Joint effusion: No significant hip joint effusion.  Bursae: No focal periarticular fluid collection.  Muscles and tendons  Muscles and tendons: The gluteus, hamstring and iliopsoas tendons appear normal. The piriformis muscles appear symmetric.  Other findings  Miscellaneous: The visualized internal pelvic contents appear unremarkable.  IMPRESSION: 1. Enlarging right sacral osseous metastasis compared with previous MRI from 2014. 2. New metastasis in the anterior intertrochanteric region of the right femur. 3. No evidence of pathologic fracture or extraosseous metastatic disease.       Impression and Plan:  This is a pleasant 49 year old gentleman with the following issues. 1. Metastatic renal cell carcinoma.  He has documented disease to the lung and the pancreas.  CT scan from 06/20/2015 showed progression of disease. He is currently on Nivolumab 3 mg/kg given every 2 weeks started on 06/29/2015. He is here for cycle 3. He has tolerated this therapy well without any major complications. The plan is to continue as planned and repeat imaging studies in 6 weeks. 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 He continues to have enlarging right sacral osseous metastasis without any fractures or dislocations. Further radiation therapy would be problematic given his previous radiation history. He is referred to interventional radiology for possible radiofrequency ablation. 3. Depression/Anxiety: He remains on Xanax and Cymbalta. His mood continues to be excellent.   4. Weight loss: Likely related to progression of cancer. 5. Neuropathic pain. He is on gabapentin and Dilaudid for pain. He also has a subcutaneous pain medication pump. 6. Hypertension: Controlled at this time  7. Follow up: in 2 weeks prior to the start of cycle #4.   Zola Button, MD

## 2015-07-27 NOTE — Telephone Encounter (Signed)
Per staff message and POF I have scheduled appts. Advised scheduler of appts. JMW  

## 2015-08-10 ENCOUNTER — Other Ambulatory Visit (HOSPITAL_BASED_OUTPATIENT_CLINIC_OR_DEPARTMENT_OTHER): Payer: 59

## 2015-08-10 ENCOUNTER — Encounter: Payer: Self-pay | Admitting: Physician Assistant

## 2015-08-10 ENCOUNTER — Other Ambulatory Visit: Payer: Self-pay | Admitting: Physician Assistant

## 2015-08-10 ENCOUNTER — Ambulatory Visit (HOSPITAL_COMMUNITY)
Admission: RE | Admit: 2015-08-10 | Discharge: 2015-08-10 | Disposition: A | Discharge status: Home / Self Care | Payer: 59 | Source: Ambulatory Visit | Attending: Internal Medicine | Admitting: Internal Medicine

## 2015-08-10 ENCOUNTER — Ambulatory Visit (HOSPITAL_BASED_OUTPATIENT_CLINIC_OR_DEPARTMENT_OTHER): Payer: 59

## 2015-08-10 ENCOUNTER — Other Ambulatory Visit (HOSPITAL_COMMUNITY): Payer: Self-pay | Admitting: Physician Assistant

## 2015-08-10 ENCOUNTER — Ambulatory Visit (HOSPITAL_BASED_OUTPATIENT_CLINIC_OR_DEPARTMENT_OTHER): Payer: 59 | Admitting: Physician Assistant

## 2015-08-10 ENCOUNTER — Telehealth: Payer: Self-pay | Admitting: Physician Assistant

## 2015-08-10 VITALS — BP 100/49 | HR 73 | Temp 98.2°F | Resp 18 | Ht 75.0 in | Wt 204.6 lb

## 2015-08-10 DIAGNOSIS — M7989 Other specified soft tissue disorders: Secondary | ICD-10-CM

## 2015-08-10 DIAGNOSIS — M792 Neuralgia and neuritis, unspecified: Secondary | ICD-10-CM

## 2015-08-10 DIAGNOSIS — F341 Dysthymic disorder: Secondary | ICD-10-CM

## 2015-08-10 DIAGNOSIS — C7889 Secondary malignant neoplasm of other digestive organs: Secondary | ICD-10-CM

## 2015-08-10 DIAGNOSIS — Z79899 Other long term (current) drug therapy: Secondary | ICD-10-CM | POA: Diagnosis not present

## 2015-08-10 DIAGNOSIS — R600 Localized edema: Secondary | ICD-10-CM

## 2015-08-10 DIAGNOSIS — C7801 Secondary malignant neoplasm of right lung: Secondary | ICD-10-CM

## 2015-08-10 DIAGNOSIS — C7989 Secondary malignant neoplasm of other specified sites: Secondary | ICD-10-CM | POA: Diagnosis not present

## 2015-08-10 DIAGNOSIS — R634 Abnormal weight loss: Secondary | ICD-10-CM

## 2015-08-10 DIAGNOSIS — R2242 Localized swelling, mass and lump, left lower limb: Secondary | ICD-10-CM | POA: Diagnosis not present

## 2015-08-10 DIAGNOSIS — I10 Essential (primary) hypertension: Secondary | ICD-10-CM

## 2015-08-10 DIAGNOSIS — C649 Malignant neoplasm of unspecified kidney, except renal pelvis: Secondary | ICD-10-CM

## 2015-08-10 DIAGNOSIS — C642 Malignant neoplasm of left kidney, except renal pelvis: Secondary | ICD-10-CM

## 2015-08-10 DIAGNOSIS — C7951 Secondary malignant neoplasm of bone: Secondary | ICD-10-CM

## 2015-08-10 DIAGNOSIS — Z5112 Encounter for antineoplastic immunotherapy: Secondary | ICD-10-CM

## 2015-08-10 DIAGNOSIS — M25561 Pain in right knee: Secondary | ICD-10-CM

## 2015-08-10 DIAGNOSIS — R52 Pain, unspecified: Secondary | ICD-10-CM

## 2015-08-10 DIAGNOSIS — M79661 Pain in right lower leg: Secondary | ICD-10-CM

## 2015-08-10 LAB — COMPREHENSIVE METABOLIC PANEL (CC13)
ALT: 23 U/L (ref 0–55)
AST: 15 U/L (ref 5–34)
Albumin: 3.5 g/dL (ref 3.5–5.0)
Alkaline Phosphatase: 114 U/L (ref 40–150)
Anion Gap: 9 mEq/L (ref 3–11)
BUN: 7.7 mg/dL (ref 7.0–26.0)
CO2: 28 mEq/L (ref 22–29)
Calcium: 10.2 mg/dL (ref 8.4–10.4)
Chloride: 100 mEq/L (ref 98–109)
Creatinine: 1.2 mg/dL (ref 0.7–1.3)
EGFR: 68 mL/min/{1.73_m2} — ABNORMAL LOW (ref >90)
Glucose: 271 mg/dl — ABNORMAL HIGH (ref 70–140)
Potassium: 4.4 mEq/L (ref 3.5–5.1)
Sodium: 137 mEq/L (ref 136–145)
Total Bilirubin: 0.27 mg/dL (ref 0.20–1.20)
Total Protein: 7.2 g/dL (ref 6.4–8.3)

## 2015-08-10 LAB — CBC WITH DIFFERENTIAL/PLATELET
BASO%: 1.2 % (ref 0.0–2.0)
Basophils Absolute: 0.1 10*3/uL (ref 0.0–0.1)
EOS%: 2 % (ref 0.0–7.0)
Eosinophils Absolute: 0.2 10*3/uL (ref 0.0–0.5)
HCT: 38.9 % (ref 38.4–49.9)
HGB: 13.1 g/dL (ref 13.0–17.1)
LYMPH%: 22.7 % (ref 14.0–49.0)
MCH: 34.2 pg — ABNORMAL HIGH (ref 27.2–33.4)
MCHC: 33.5 g/dL (ref 32.0–36.0)
MCV: 101.8 fL — ABNORMAL HIGH (ref 79.3–98.0)
MONO#: 0.6 10*3/uL (ref 0.1–0.9)
MONO%: 7.5 % (ref 0.0–14.0)
NEUT#: 5.3 10*3/uL (ref 1.5–6.5)
NEUT%: 66.6 % (ref 39.0–75.0)
Platelets: 208 10*3/uL (ref 140–400)
RBC: 3.82 10*6/uL — ABNORMAL LOW (ref 4.20–5.82)
RDW: 14.9 % — ABNORMAL HIGH (ref 11.0–14.6)
WBC: 8 10*3/uL (ref 4.0–10.3)
lymph#: 1.8 10*3/uL (ref 0.9–3.3)

## 2015-08-10 LAB — TSH CHCC: TSH: 2.187 m(IU)/L (ref 0.320–4.118)

## 2015-08-10 MED ORDER — SODIUM CHLORIDE 0.9 % IV SOLN
Freq: Once | INTRAVENOUS | Status: AC
  Administered 2015-08-10: 10:00:00 via INTRAVENOUS

## 2015-08-10 MED ORDER — SODIUM CHLORIDE 0.9 % IV SOLN
3.0000 mg/kg | Freq: Once | INTRAVENOUS | Status: AC
  Administered 2015-08-10: 280 mg via INTRAVENOUS
  Filled 2015-08-10: qty 28

## 2015-08-10 NOTE — Patient Instructions (Signed)
[facility] Discharge Instructions for Patients Receiving Chemotherapy  Today you received the following chemotherapy agents Nivolumab  To help prevent nausea and vomiting after your treatment, we encourage you to take your nausea medication     If you develop nausea and vomiting that is not controlled by your nausea medication, call the clinic.   BELOW ARE SYMPTOMS THAT SHOULD BE REPORTED IMMEDIATELY:  *FEVER GREATER THAN 100.5 F  *CHILLS WITH OR WITHOUT FEVER  NAUSEA AND VOMITING THAT IS NOT CONTROLLED WITH YOUR NAUSEA MEDICATION  *UNUSUAL SHORTNESS OF BREATH  *UNUSUAL BRUISING OR BLEEDING  TENDERNESS IN MOUTH AND THROAT WITH OR WITHOUT PRESENCE OF ULCERS  *URINARY PROBLEMS  *BOWEL PROBLEMS  UNUSUAL RASH Items with * indicate a potential emergency and should be followed up as soon as possible.  Feel free to call the clinic you have any questions or concerns. The clinic phone number is [phone].  Please show the CHEMO ALERT CARD at check-in to the Emergency Department and triage nurse.   

## 2015-08-10 NOTE — Progress Notes (Addendum)
Hematology and Oncology Follow Up Visit  Hadden Steig [id number] 1966/01/28 49 y.o. 08/10/2015 3:11 PM  [name], MD  [name], M.D.  [name], MD  [name], MD    Principle Diagnosis: This is a 49 year old gentleman with stage IV renal cell carcinoma diagnosed in 2009.  Prior Therapy: 1. Status post laparoscopic radical nephrectomy.  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  2. Patient status post thoracotomy for a synchronous metastatic lung lesions done October 2009.  He had a lower lobe nodule, biopsy proven to be metastatic renal cell carcinoma.   3. Patient is status post stereotactic radiotherapy to pulmonary nodules in May of 2010. 4. He is S/P Sutent 50 mg 4 weeks on 2 weeks off from 10/2010 to 03/2013. He progressed at that time.  5. He is S/P radiation to the right sacral bone between 4/22 to 4/30.  6. He is S/P XRT to the left shoulder 03/20/14 to 03/31/14. 7. Votrient 800 mg by mouth daily from 03/2013 through 06/22/2015. Discontinued secondary to disease progression   Current therapy: Nivolumab 3 mg/kg given every 2 weeks started on 06/29/2015. He is here for cycle 4.  Interim History:  Mr. Savage presents today for a followup visit. Since his last visit, he reports no major complaints related to systemic therapy. He continues to have a right-sided hip pain and numbness that is radiating to the bottom of his foot.  He continues to benefit from the subcutaneous pain medication pump as well as Dilaudid breakthrough. Today he reports increased pain and swelling in his lower extremities. He notes painful swelling on the inner aspect of his right knee and the outer left calf region. He is currently tolerating immunotherapy in the form of Nivolumab 3 mg/kg given every 2 weeks. He denied any difficulty breathing, diarrhea, skin rashes, headaches, blurred or double vision. He does not report any neurological symptoms associated with  it. He still able to ambulate without any difficulties. He is able to drive and attends to activities of daily living.     He has not reported any episodes of nausea or vomiting. He is not reporting any constipation. Has not reported any recent hospitalizations or illnesses. He did not report any headaches blurred vision or double vision. Does not report any seizure activity or alteration of mental status. Is not reporting any chest pain or difficulty breathing. Is not reporting any cough or hemoptysis. Does not report any abdominal pain or hematochezia. Does not report any hematuria but does report occasional hesitancy and nocturia. He does not report any rashes or lesions or petechiae. He does not report any lymphadenopathy. His performance status and activity level remain relatively stable at this time. Remainder of his review of system is unremarkable.  Medications: I have reviewed the patient's current medications.  Current Outpatient Prescriptions  Medication Sig Dispense Refill  . ALPRAZolam (XANAX) 1 MG tablet Take 1 tablet (1 mg total) by mouth 3 (three) times daily as needed for anxiety. 100 tablet 0  . docusate sodium (COLACE) 100 MG capsule Take 100 mg by mouth 2 (two) times daily.    . DULoxetine (CYMBALTA) 60 MG capsule Take 1 capsule (60 mg total) by mouth 2 (two) times daily. 60 capsule 2  . gabapentin (NEURONTIN) 300 MG capsule Take 1 capsule (300 mg total) by mouth 3 (three) times daily. 90 capsule 3  . HYDROmorphone (DILAUDID) 8 MG tablet Take 1 tablet (8 mg total) by mouth  every 4 (four) hours as needed for moderate pain or severe pain. 50 tablet 0  . ibuprofen (ADVIL,MOTRIN) 800 MG tablet Take 800 mg by mouth every 8 (eight) hours as needed for moderate pain.     [name] senna (SENOKOT) 8.6 MG tablet Take 1 tablet by mouth daily.    [name] tiZANidine (ZANAFLEX) 4 MG tablet Take 4 mg by mouth every 8 (eight) hours as needed for muscle spasms.   0  . VIAGRA 100 MG tablet Take 50 mg by mouth as  needed for erectile dysfunction.      No current facility-administered medications for this visit.     Allergies:  Allergies  Allergen Reactions  . Ceftriaxone Hives  . Hydrocodone Swelling    Past Medical History, Surgical history, Social history, and Family History were reviewed and updated.   Physical Exam: Blood pressure 100/49, pulse 73, temperature 98.2 F (36.8 C), temperature source Oral, resp. rate 18, height '6\' 3"'$  (1.905 m), weight 204 lb 9.6 oz (92.806 kg), SpO2 100 %. ECOG: 1 General appearance: alert and awake well-appearing gentleman not in any distress.  Head: Normocephalic, without obvious abnormality.  Neck: no adenopathy, no masses.  Lymph nodes: Cervical, supraclavicular, and axillary nodes normal. Heart:regular rate and rhythm, S1, S2.  Lung:chest clear, no wheezing, rales, normal symmetric air entry. No dullness to percussion.  Abdomen: soft, non-tender, without masses or organomegaly EXT: 1+ soft tissue swelling right medial patella, firm tender mass, approximately 2 cm left lateral calf, minimal erythema  Neuro: no focal deficits noted. No weakness noted in his lower extremities.  Lab Results: Lab Results  Component Value Date   WBC 8.0 08/10/2015   HGB 13.1 08/10/2015   HCT 38.9 08/10/2015   MCV 101.8* 08/10/2015   PLT 208 08/10/2015   EXAM: MRI LUMBAR SPINE WITHOUT AND WITH CONTRAST  TECHNIQUE: Multiplanar and multiecho pulse sequences of the lumbar spine were obtained without and with intravenous contrast.  CONTRAST: 19 mL MultiHance in conjunction with contrast enhanced imaging of the pelvis reported separately.  COMPARISON: Pelvis MRI from today reported separately. Total spine MRI 12/25/2014. CT Abdomen and Pelvis 06/20/2015.  FINDINGS: Transitional lumbosacral anatomy, same numbering system as on 12/25/2014.  Partially visible abnormal right sacral ala, and abnormal T1 signal hair appears progressed since January (series 7,  image 33). The right S2 neural foramen and exiting nerve appear affected (series 6, image 3).  Bone marrow signal in the lumbar spine appears stable since January. No lumbar metastasis identified. Lumbar vertebral height and alignment are stable.  Visualized lower thoracic spinal cord is normal with conus medularis at L1-L2. Cauda equina nerve roots appear within normal limits. No abnormal intradural enhancement identified.  No significant lumbar spinal stenosis. Chronic disc degeneration at L5-S1.  Stable visualized abdominal viscera.  IMPRESSION: 1. 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 See pelvis MRI from today reported separately. 2. No lumbar metastatic disease identified. Note transitional anatomy, same numbering system utilized as on 12/25/2014.   Electronically Signed  By: Genevie Ann M.D.  On: 07/26/2015 10:26           Vitals     Height Weight BMI (Calculated)    '6\' 3"'$  (1.905 m) 198 lb (89.812 kg) 24.8      Interpretation Summary     CLINICAL DATA: 49 year old male with metastatic renal cell carcinoma currently on chemotherapy. Lumbosacral radiculopathy. Status post radiation treatment of right sacral oligometastatic deposit to 50 gray in 5 fractions.  Subsequent encounter.  EXAM: MRI LUMBAR SPINE WITHOUT AND WITH CONTRAST  TECHNIQUE: Multiplanar and multiecho pulse sequences of the lumbar spine were obtained without and with intravenous contrast.  CONTRAST: 19 mL MultiHance in conjunction with contrast enhanced imaging of the pelvis reported separately.  COMPARISON: Pelvis MRI from today reported separately. Total spine MRI 12/25/2014. CT Abdomen and Pelvis 06/20/2015.  FINDINGS: Transitional lumbosacral anatomy, same numbering system as on 12/25/2014.  Partially visible abnormal right sacral ala, and abnormal T1 signal hair appears progressed since January  (series 7, image 33). The right S2 neural foramen and exiting nerve appear affected (series 6, image 3).  Bone marrow signal in the lumbar spine appears stable since January. No lumbar metastasis identified. Lumbar vertebral height and alignment are stable.  Visualized lower thoracic spinal cord is normal with conus medularis at L1-L2. Cauda equina nerve roots appear within normal limits. No abnormal intradural enhancement identified.  No significant lumbar spinal stenosis. Chronic disc degeneration at L5-S1.  Stable visualized abdominal viscera.  IMPRESSION: 1. 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 See pelvis MRI from today reported separately. 2. No lumbar metastatic disease identified. Note transitional anatomy, same numbering system utilized as on 12/25/2014.     EXAM: MRI PELVIS WITHOUT AND WITH CONTRAST  TECHNIQUE: Multiplanar multisequence MR imaging of the pelvis was performed both before and after administration of intravenous contrast.  CONTRAST: 19 ml MultiHance.  COMPARISON: Radiographs 06/19/2015, CT 12/29/2014, bone scan 04/05/2013 and MRI 03/09/2013.  FINDINGS: Bones: Both femoral heads appear normal without evidence of acute fracture, dislocation or avascular necrosis. There is a new enhancing 1.8 cm endosteal lesion anteriorly in the intertrochanteric region of the proximal right femur consistent with a metastasis. The known right sacral metastasis has enlarged compared with the previous MRI. This currently measures 3.2 x 4.2 x 4.0 cm and demonstrates heterogeneous enhancement following contrast. The right femoral lesion is associated with endosteal thinning. There is no evidence of pathologic fracture. No other osseous metastases identified. The sacroiliac joints and symphysis pubis appear normal.  Articular cartilage and labrum  Articular cartilage: No focal chondral defect  or subchondral signal abnormality identified.  Labrum: Mild labral degeneration, greater on the right. No gross labral tear.  Joint or bursal effusion  Joint effusion: No significant hip joint effusion.  Bursae: No focal periarticular fluid collection.  Muscles and tendons  Muscles and tendons: The gluteus, hamstring and iliopsoas tendons appear normal. The piriformis muscles appear symmetric.  Other findings  Miscellaneous: The visualized internal pelvic contents appear unremarkable.  IMPRESSION: 1. Enlarging right sacral osseous metastasis compared with previous MRI from 2014. 2. New metastasis in the anterior intertrochanteric region of the right femur. 3. No evidence of pathologic fracture or extraosseous metastatic disease.   Electronically Signed  By: Richardean Sale M.D.  On: 07/26/2015 10:56           Vitals     Height Weight BMI (Calculated)    '6\' 3"'$  (1.905 m) 198 lb (89.812 kg) 24.8      Interpretation Summary     CLINICAL DATA: Metastatic renal cell carcinoma for follow up. Currently undergoing chemotherapy. Subsequent encounter)  EXAM: MRI PELVIS WITHOUT AND WITH CONTRAST  TECHNIQUE: Multiplanar multisequence MR imaging of the pelvis was performed both before and after administration of intravenous contrast.  CONTRAST: 19 ml MultiHance.  COMPARISON: Radiographs 06/19/2015, CT 12/29/2014, bone scan 04/05/2013 and MRI 03/09/2013.  FINDINGS: Bones: Both femoral heads appear normal without evidence of acute  fracture, dislocation or avascular necrosis. There is a new enhancing 1.8 cm endosteal lesion anteriorly in the intertrochanteric region of the proximal right femur consistent with a metastasis. The known right sacral metastasis has enlarged compared with the previous MRI. This currently measures 3.2 x 4.2 x 4.0 cm and demonstrates heterogeneous enhancement following contrast. The right femoral lesion is  associated with endosteal thinning. There is no evidence of pathologic fracture. No other osseous metastases identified. The sacroiliac joints and symphysis pubis appear normal.  Articular cartilage and labrum  Articular cartilage: No focal chondral defect or subchondral signal abnormality identified.  Labrum: Mild labral degeneration, greater on the right. No gross labral tear.  Joint or bursal effusion  Joint effusion: No significant hip joint effusion.  Bursae: No focal periarticular fluid collection.  Muscles and tendons  Muscles and tendons: The gluteus, hamstring and iliopsoas tendons appear normal. The piriformis muscles appear symmetric.  Other findings  Miscellaneous: The visualized internal pelvic contents appear unremarkable.  IMPRESSION: 1. Enlarging right sacral osseous metastasis compared with previous MRI from 2014. 2. New metastasis in the anterior intertrochanteric region of the right femur. 3. No evidence of pathologic fracture or extraosseous metastatic disease.       Impression and Plan:  This is a pleasant 49 year old gentleman with the following issues. 1. Metastatic renal cell carcinoma.  He has documented disease to the lung and the pancreas.  CT scan from 06/20/2015 showed progression of disease. He is currently on Nivolumab 3 mg/kg given every 2 weeks started on 06/29/2015. He is here for cycle 3. He has tolerated this therapy well without any major complications. The plan is to continue as planned and repeat imaging studies in 6 weeks. 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 He continues to have enlarging right sacral osseous metastasis without any fractures or dislocations. Further radiation therapy would be problematic given his previous radiation history. He is referred to interventional radiology for possible radiofrequency ablation. 3. Depression/Anxiety: He remains on  Xanax and Cymbalta. His mood continues to be excellent.  4. Weight loss: Likely related to progression of cancer. 5. Neuropathic pain. He is on gabapentin and Dilaudid for pain. He also has a subcutaneous pain medication pump. 6. Hypertension: Controlled at this time  7. Bilateral lower extremity pain and swelling: patient will be sent for bilateral lower extremity dopplers to evaluate for possible DVT. Should a DVT be present, appropriate anti-coagulation therapy will be instituted. 8. Follow up: in 2 weeks prior to the start of cycle #5.   * The patient will be due for his Xanax refill when he returns in 2 weeks.  Wynetta Emery, Nickalus Thornsberry E, PA-C

## 2015-08-10 NOTE — Telephone Encounter (Signed)
Called Vascular Lab a [place] to see if Pt. Can be seen today for R/o DVT  bilateral  Lower extremity. Spoke with Butch Penny who stated that Pt. Can be seen at [facility] at 12 noon today. Checked with Pt. To make sure he can commit to appt.Pt. Verbalized that he can make appt. Adrena put in stat orders and to be paged with results.

## 2015-08-10 NOTE — Progress Notes (Signed)
VASCULAR LAB PRELIMINARY  PRELIMINARY  PRELIMINARY  PRELIMINARY  Bilateral lower extremity venous duplex  completed.    Preliminary report:  Bilateral:  No evidence of DVT, superficial thrombosis, or Baker's Cyst.  There is a mass located in the lateral left calf at the area of pain.  It is vascularized, adjacent to the bone and measures approx 1.5 by 2.5 cm.  Radhika Dershem, RVT 08/10/2015, 12:41 PM

## 2015-08-16 NOTE — Patient Instructions (Signed)
Follow up in 2 weeks prior to your next cycle of immunotherapy

## 2015-08-24 ENCOUNTER — Telehealth: Payer: Self-pay | Admitting: Oncology

## 2015-08-24 ENCOUNTER — Telehealth: Payer: Self-pay | Admitting: *Deleted

## 2015-08-24 ENCOUNTER — Ambulatory Visit: Payer: 59

## 2015-08-24 ENCOUNTER — Ambulatory Visit (HOSPITAL_BASED_OUTPATIENT_CLINIC_OR_DEPARTMENT_OTHER): Payer: 59 | Admitting: Oncology

## 2015-08-24 ENCOUNTER — Other Ambulatory Visit (HOSPITAL_BASED_OUTPATIENT_CLINIC_OR_DEPARTMENT_OTHER): Payer: 59

## 2015-08-24 VITALS — BP 129/85 | HR 80 | Temp 97.8°F | Resp 18 | Ht 75.0 in | Wt 205.2 lb

## 2015-08-24 DIAGNOSIS — C649 Malignant neoplasm of unspecified kidney, except renal pelvis: Secondary | ICD-10-CM

## 2015-08-24 DIAGNOSIS — M79622 Pain in left upper arm: Secondary | ICD-10-CM | POA: Diagnosis not present

## 2015-08-24 DIAGNOSIS — C642 Malignant neoplasm of left kidney, except renal pelvis: Secondary | ICD-10-CM

## 2015-08-24 DIAGNOSIS — R2242 Localized swelling, mass and lump, left lower limb: Secondary | ICD-10-CM

## 2015-08-24 DIAGNOSIS — I10 Essential (primary) hypertension: Secondary | ICD-10-CM

## 2015-08-24 DIAGNOSIS — C7951 Secondary malignant neoplasm of bone: Secondary | ICD-10-CM

## 2015-08-24 DIAGNOSIS — R634 Abnormal weight loss: Secondary | ICD-10-CM

## 2015-08-24 DIAGNOSIS — M792 Neuralgia and neuritis, unspecified: Secondary | ICD-10-CM

## 2015-08-24 DIAGNOSIS — F341 Dysthymic disorder: Secondary | ICD-10-CM

## 2015-08-24 LAB — COMPREHENSIVE METABOLIC PANEL (CC13)
ALT: 16 U/L (ref 0–55)
AST: 12 U/L (ref 5–34)
Albumin: 3.6 g/dL (ref 3.5–5.0)
Alkaline Phosphatase: 109 U/L (ref 40–150)
Anion Gap: 8 mEq/L (ref 3–11)
BUN: 9.5 mg/dL (ref 7.0–26.0)
CO2: 28 mEq/L (ref 22–29)
Calcium: 10.1 mg/dL (ref 8.4–10.4)
Chloride: 101 mEq/L (ref 98–109)
Creatinine: 1.2 mg/dL (ref 0.7–1.3)
EGFR: 75 mL/min/{1.73_m2} — ABNORMAL LOW (ref >90)
Glucose: 224 mg/dl — ABNORMAL HIGH (ref 70–140)
Potassium: 4.3 mEq/L (ref 3.5–5.1)
Sodium: 137 mEq/L (ref 136–145)
Total Bilirubin: 0.33 mg/dL (ref 0.20–1.20)
Total Protein: 7.5 g/dL (ref 6.4–8.3)

## 2015-08-24 LAB — CBC WITH DIFFERENTIAL/PLATELET
BASO%: 0.4 % (ref 0.0–2.0)
Basophils Absolute: 0 10*3/uL (ref 0.0–0.1)
EOS%: 3.3 % (ref 0.0–7.0)
Eosinophils Absolute: 0.2 10*3/uL (ref 0.0–0.5)
HCT: 39 % (ref 38.4–49.9)
HGB: 12.6 g/dL — ABNORMAL LOW (ref 13.0–17.1)
LYMPH%: 22.8 % (ref 14.0–49.0)
MCH: 32.9 pg (ref 27.2–33.4)
MCHC: 32.3 g/dL (ref 32.0–36.0)
MCV: 101.8 fL — ABNORMAL HIGH (ref 79.3–98.0)
MONO#: 0.6 10*3/uL (ref 0.1–0.9)
MONO%: 8.3 % (ref 0.0–14.0)
NEUT#: 4.8 10*3/uL (ref 1.5–6.5)
NEUT%: 65.2 % (ref 39.0–75.0)
Platelets: 210 10*3/uL (ref 140–400)
RBC: 3.83 10*6/uL — ABNORMAL LOW (ref 4.20–5.82)
RDW: 14.4 % (ref 11.0–14.6)
WBC: 7.3 10*3/uL (ref 4.0–10.3)
lymph#: 1.7 10*3/uL (ref 0.9–3.3)

## 2015-08-24 LAB — TSH CHCC: TSH: 1.348 m(IU)/L (ref 0.320–4.118)

## 2015-08-24 MED ORDER — ALPRAZOLAM 1 MG PO TABS: 1.0000 mg | ORAL_TABLET | Freq: Three times a day (TID) | ORAL | Status: DC | PRN

## 2015-08-24 MED ORDER — PREDNISONE 20 MG PO TABS: ORAL_TABLET | ORAL | Status: DC

## 2015-08-24 NOTE — Progress Notes (Signed)
Hematology and Oncology Follow Up Visit  Keith [name] [id number] Aug 13, 1966 49 y.o. 08/24/2015 10:13 AM  Keith Bring, MD  Keith [name], M.D.  Keith Bent, MD  Keith Banister, MD    Principle Diagnosis: This is a 49 year old gentleman with stage IV renal cell carcinoma diagnosed in 2009.  Prior Therapy: 1. Status post laparoscopic radical nephrectomy.  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  2. Patient status post thoracotomy for a synchronous metastatic lung lesions done October 2009.  He had a lower lobe nodule, biopsy proven to be metastatic renal cell carcinoma.   3. Patient is status post stereotactic radiotherapy to pulmonary nodules in May of 2010. 4. He is S/P Sutent 50 mg 4 weeks on 2 weeks off from 10/2010 to 03/2013. He progressed at that time.  5. He is S/P radiation to the right sacral bone between 4/22 to 4/30.  6. He is S/P XRT to the left shoulder 03/20/14 to 03/31/14. 7. Votrient 800 mg by mouth daily from 03/2013 through 06/22/2015. Discontinued secondary to disease progression   Current therapy: Nivolumab 3 mg/kg given every 2 weeks started on 06/29/2015. He is here for cycle 5.  Interim History:  Keith [name] presents today for a followup visit. Since his last visit, he reports worsening lower extremity pain. He noticed this at the beginning of his treatment but have been getting worse significantly last few weeks. He developed lower extremity edema and vascular ultrasound rule out deep vein thrombosis but did show a mass in his left leg. Although the edema has improved in the last 2 weeks, his pain in his lower extremity have worsened. He is reporting a painful ambulation. Very tender to touch skin over both lower extremities. He does report a slight protrusion noted on the lateral aspect of his left leg.  He does not report any other pain at this time. He feels the pain pump and Dilaudid have helped his hip pain in his back pain. But  have not helped his lower show any pain.  He denied any difficulty breathing, diarrhea, skin rashes, headaches, blurred or double vision. He does not report any neurological symptoms associated with it.  He is able to drive and attends to activities of daily living.     He has not reported any episodes of nausea or vomiting. He is not reporting any constipation. Has not reported any recent hospitalizations or illnesses. He did not report any headaches blurred vision or double vision. Does not report any seizure activity or alteration of mental status. Is not reporting any chest pain or difficulty breathing. Is not reporting any cough or hemoptysis. Does not report any abdominal pain or hematochezia. Does not report any hematuria but does report occasional hesitancy and nocturia. He does not report any rashes or lesions or petechiae. He does not report any lymphadenopathy. His performance status and activity level remain relatively stable at this time. Remainder of his review of system is unremarkable.  Medications: I have reviewed the patient's current medications.  Current Outpatient Prescriptions  Medication Sig Dispense Refill  . ALPRAZolam (XANAX) 1 MG tablet Take 1 tablet (1 mg total) by mouth 3 (three) times daily as needed for anxiety. 100 tablet 0  . DULoxetine (CYMBALTA) 60 MG capsule Take 1 capsule (60 mg total) by mouth 2 (two) times daily. 60 capsule 2  . gabapentin (NEURONTIN) 300 MG capsule Take 1 capsule (300 mg total) by mouth 3 (three) times daily. 90 capsule  3  . HYDROmorphone (DILAUDID) 8 MG tablet Take 1 tablet (8 mg total) by mouth every 4 (four) hours as needed for moderate pain or severe pain. 50 tablet 0  . tiZANidine (ZANAFLEX) 4 MG tablet Take 4 mg by mouth every 8 (eight) hours as needed for muscle spasms.   0  . VIAGRA 100 MG tablet Take 50 mg by mouth as needed for erectile dysfunction.     . predniSONE (DELTASONE) 20 MG tablet Take 3 tablets daily for a week. Then,  Take  2 tablets daily for a week. Then,  Take one tablet daily for a week. Then stop. 42 tablet 0   No current facility-administered medications for this visit.     Allergies:  Allergies  Allergen Reactions  . Ceftriaxone Hives  . Hydrocodone Swelling    Past Medical History, Surgical history, Social history, and Family History were reviewed and updated.   Physical Exam: Blood pressure 129/85, pulse 80, temperature 97.8 F (36.6 C), temperature source Oral, resp. rate 18, height '6\' 3"'$  (1.905 m), weight 205 lb 3.2 oz (93.078 kg). ECOG: 1 General appearance: alert and awake gentleman appeared in mild distress today. Head: Normocephalic, without obvious abnormality.  Neck: no adenopathy, no masses.  Lymph nodes: Cervical, supraclavicular, and axillary nodes normal. Heart:regular rate and rhythm, S1, S2.  Lung:chest clear, no wheezing, rales, normal symmetric air entry. No dullness to percussion.  Abdomen: soft, non-tender, without masses or organomegaly EXT:no edema, no despumation. Very tender to touch. Slight protrusion noted on the left leg on the lateral aspect. Neuro: no focal deficits noted. No weakness noted in his lower extremities.  Lab Results: Lab Results  Component Value Date   WBC 7.3 08/24/2015   HGB 12.6* 08/24/2015   HCT 39.0 08/24/2015   MCV 101.8* 08/24/2015   PLT 210 08/24/2015   Vascular ultrasound of the lower extremity on 08/10/2015: Summary:  - No evidence of deep vein thrombosis involving the right lower extremity and left lower extremity. - There is a vascularized mass in the lateral left calf adjacent to the bone. It measures approximately 1.5 x 2.5 cm. Further imaging studies may be appropiate as clinically indicated.     Impression and Plan:  This is a pleasant 49 year old gentleman with the following issues. 1. Metastatic renal cell carcinoma.  He has documented disease to the lung and the pancreas.  CT scan from 06/20/2015 showed  progression of disease. He is currently on Nivolumab 3 mg/kg given every 2 weeks started on 06/29/2015. Therapy has been well tolerated except for the last few weeks he has noted significant lower extremity pain. It is unclear whether this is related to his treatment or not. He reports his symptoms have significantly worse after each infusion. For this reason, we will hold treatment today and restage him with a CT scan chest abdomen and pelvis to assess tumor response before we resume this treatment. He has cancer progression, than salvage therapy will be used. If he develops a good response to this therapy, we will consider resuming it at that time. 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 His pain appears to be manageable at this time.. 3. Depression/Anxiety: He remains on Xanax and Cymbalta. His mood continues to be reasonable at this time. 4. Weight loss: Likely related to progression of cancer. Continues to encourage nutritional supplements. 5. Neuropathic pain. He is on gabapentin  and Dilaudid for pain. He also has a subcutaneous pain medication pump. 6. Left lower extremity pain and a mass: Unclear if he has developed inflammatory arthritis related to his immunotherapy. His pain is rather debilitated and interfering with his wife. For this reason, I will hold his treatment as mentioned and I started him on a prednisone taper to improve his symptoms. I will also obtain an MRI of his left lower extremity to have a better idea of the mass in that area. 7. Hypertension: Controlled at this time  8. Follow up: in 2 weeks to discuss the results of his imaging studies.  Zola Button, MD

## 2015-08-24 NOTE — Telephone Encounter (Signed)
[facility] WILL NOT BE ABLE TO DO PT.'S MRI DUE TO THE PAIN PUMP IN THE LEFT SIDE OF PT.'S STOMACH. PT. STATES HE HAD A MRI A FEW WEEKS AGO AT [facility].

## 2015-08-24 NOTE — Telephone Encounter (Signed)
Gave patient avs report and appointments for September. [facility] radi[facility] schedulers will contact patient re ct - patient aware.

## 2015-09-03 NOTE — Telephone Encounter (Signed)
Patient's pain pump is safe for MRI and he's been scanned in the past with the pump.  Per Horris Latino he can be scanned tomorrow.  Observed orders and instructed Horris Latino to proceed with contacting patient for MRI per Dr. Hazeline Junker orders.

## 2015-09-04 ENCOUNTER — Other Ambulatory Visit: Payer: Self-pay | Admitting: *Deleted

## 2015-09-04 ENCOUNTER — Ambulatory Visit (HOSPITAL_COMMUNITY): Payer: 59

## 2015-09-04 DIAGNOSIS — K8689 Other specified diseases of pancreas: Secondary | ICD-10-CM

## 2015-09-04 DIAGNOSIS — C649 Malignant neoplasm of unspecified kidney, except renal pelvis: Secondary | ICD-10-CM

## 2015-09-04 DIAGNOSIS — C7951 Secondary malignant neoplasm of bone: Secondary | ICD-10-CM

## 2015-09-07 ENCOUNTER — Other Ambulatory Visit: Payer: 59

## 2015-09-07 ENCOUNTER — Telehealth: Payer: Self-pay | Admitting: Oncology

## 2015-09-07 ENCOUNTER — Ambulatory Visit (HOSPITAL_BASED_OUTPATIENT_CLINIC_OR_DEPARTMENT_OTHER): Payer: 59 | Admitting: Oncology

## 2015-09-07 ENCOUNTER — Ambulatory Visit: Payer: 59

## 2015-09-07 VITALS — BP 154/88 | HR 102 | Temp 97.7°F | Resp 18 | Wt 193.4 lb

## 2015-09-07 DIAGNOSIS — C649 Malignant neoplasm of unspecified kidney, except renal pelvis: Secondary | ICD-10-CM

## 2015-09-07 MED ORDER — MEGESTROL ACETATE 400 MG/10ML PO SUSP: 400.0000 mg | Freq: Two times a day (BID) | ORAL | Status: DC

## 2015-09-07 NOTE — Telephone Encounter (Signed)
Called Patient at home. Notified of at 09/14/2015 at 10:00am.

## 2015-09-07 NOTE — Progress Notes (Signed)
Hematology and Oncology Follow Up Visit  Keith [name] [id number] June 25, 1966 49 y.o. 09/07/2015 9:55 AM  [name], MD  [name], M.D.  [name], MD  [name], MD    Principle Diagnosis: This is a 49 year old gentleman with stage IV renal cell carcinoma diagnosed in 2009.  Prior Therapy: 1. Status post laparoscopic radical nephrectomy.  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  2. Patient status post thoracotomy for a synchronous metastatic lung lesions done October 2009.  He had a lower lobe nodule, biopsy proven to be metastatic renal cell carcinoma.   3. Patient is status post stereotactic radiotherapy to pulmonary nodules in May of 2010. 4. He is S/P Sutent 50 mg 4 weeks on 2 weeks off from 10/2010 to 03/2013. He progressed at that time.  5. He is S/P radiation to the right sacral bone between 4/22 to 4/30.  6. He is S/P XRT to the left shoulder 03/20/14 to 03/31/14. 7. Votrient 800 mg by mouth daily from 03/2013 through 06/22/2015. Discontinued secondary to disease progression   Current therapy: Nivolumab 3 mg/kg given every 2 weeks started on 06/29/2015. He is status post 4 cycles completed 08/10/2015. Treatment has been on hold since.  Interim History:  Mr. Vanwieren presents today for a followup visit. Since his last visit, he reports worsening lower extremity pain. His pain is predominantly on the left side. With holding treatment and prednisone has helped the rest of his arthralgias and myalgias. He has reported much improved hip pain, ankle pain. He continues to have left leg pain however. He does report a slight protrusion noted on the lateral aspect of his left leg which has not changed in the last 2 weeks. He is scheduled to have an MRI on 09/12/2015.  He also reported worsening in his appetite and of lost close to 10 pounds. He does not report any dysphasia or bone dysphagia. He reports a lot of it is related to until stress and  not wanting to eat. He does have an element of depression.  He does not report any other pain at this time. He feels the pain pump and Dilaudid have helped his hip pain in his back pain. But have not helped his lower show any pain.  He denied any difficulty breathing, diarrhea, skin rashes, headaches, blurred or double vision. He does not report any neurological symptoms associated with it.  He is able to drive and attends to activities of daily living.     He has not reported any episodes of nausea or vomiting. He is not reporting any constipation. Has not reported any recent hospitalizations or illnesses. He did not report any headaches blurred vision or double vision. Does not report any seizure activity or alteration of mental status. Is not reporting any chest pain or difficulty breathing. Is not reporting any cough or hemoptysis. Does not report any abdominal pain or hematochezia. Does not report any hematuria but does report occasional hesitancy and nocturia. He does not report any rashes or lesions or petechiae. He does not report any lymphadenopathy. His performance status and activity level remain relatively stable at this time. Remainder of his review of system is unremarkable.  Medications: I have reviewed the patient's current medications.  Current Outpatient Prescriptions  Medication Sig Dispense Refill  . ALPRAZolam (XANAX) 1 MG tablet Take 1 tablet (1 mg total) by mouth 3 (three) times daily as needed for anxiety. 100 tablet 0  . DULoxetine (CYMBALTA)  60 MG capsule Take 1 capsule (60 mg total) by mouth 2 (two) times daily. 60 capsule 2  . gabapentin (NEURONTIN) 300 MG capsule Take 1 capsule (300 mg total) by mouth 3 (three) times daily. 90 capsule 3  . HYDROmorphone (DILAUDID) 8 MG tablet Take 1 tablet (8 mg total) by mouth every 4 (four) hours as needed for moderate pain or severe pain. 50 tablet 0  . predniSONE (DELTASONE) 20 MG tablet Take 3 tablets daily for a week. Then,  Take 2  tablets daily for a week. Then,  Take one tablet daily for a week. Then stop. 42 tablet 0  . tiZANidine (ZANAFLEX) 4 MG tablet Take 4 mg by mouth every 8 (eight) hours as needed for muscle spasms.   0  . VIAGRA 100 MG tablet Take 50 mg by mouth as needed for erectile dysfunction.     . megestrol (MEGACE) 400 MG/10ML suspension Take 10 mLs (400 mg total) by mouth 2 (two) times daily. 240 mL 0   No current facility-administered medications for this visit.     Allergies:  Allergies  Allergen Reactions  . Ceftriaxone Hives  . Hydrocodone Swelling    Past Medical History, Surgical history, Social history, and Family History were reviewed and updated.   Physical Exam: Blood pressure 154/88, pulse 102, temperature 97.7 F (36.5 C), temperature source Oral, resp. rate 18, weight 193 lb 7 oz (87.743 kg). ECOG: 1 General appearance: alert and awake gentleman chronically ill-appearing at this time. Head: Normocephalic, without obvious abnormality.  Neck: no adenopathy, no masses.  Lymph nodes: Cervical, supraclavicular, and axillary nodes normal. Heart:regular rate and rhythm, S1, S2.  Lung:chest clear, no wheezing, rales, normal symmetric air entry. No dullness to percussion.  Abdomen: soft, non-tender, without masses or organomegaly EXT:no edema, no despumation. Very tender to touch. Slight protrusion noted on the left leg on the lateral aspect. Neuro: no focal deficits noted. No weakness noted in his lower extremities.  Lab Results: Lab Results  Component Value Date   WBC 7.3 08/24/2015   HGB 12.6* 08/24/2015   HCT 39.0 08/24/2015   MCV 101.8* 08/24/2015   PLT 210 08/24/2015   Vascular ultrasound of the lower extremity on 08/10/2015: Summary:  - No evidence of deep vein thrombosis involving the right lower extremity and left lower extremity. - There is a vascularized mass in the lateral left calf adjacent to the bone. It measures approximately 1.5 x 2.5 cm. Further  imaging studies may be appropiate as clinically indicated.     Impression and Plan:  This is a pleasant 49 year old gentleman with the following issues. 1. Metastatic renal cell carcinoma.  He has documented disease to the lung and the pancreas.  CT scan from 06/20/2015 showed progression of disease. He is currently on Nivolumab 3 mg/kg given every 2 weeks started on 06/29/2015. Therapy has been well tolerated it initially but developed diffuse arthralgias and myalgias and therapy of a hold since that time. He has been on prednisone taper which have helped his diffuse arthralgias at this time. The plan is to repeat imaging studies for staging purposes and determine the next course of action. He might require different salvage regimen at that time. 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 His pain appears to be manageable at this time.. 3. Depression/Anxiety: He remains on Xanax and Cymbalta. His mood continues to be reasonable at  this time. 4. Weight loss: Likely related to progression of cancer. I gave him prescription for Megace today. 5. Neuropathic pain. He is on gabapentin and Dilaudid for pain. He also has a subcutaneous pain medication pump. 6. Left lower extremity pain and a mass: Unclear if he has developed inflammatory arthritis related to his immunotherapy. His pain is rather debilitated and interfering with his life. He will have an MRI done in the near future and we will refer him to radiation oncology in this is indeed tumor-related. 7. Hypertension: Controlled at this time  8. Follow up: in 1 weeks to discuss the results of his imaging studies.  Zola Button, MD

## 2015-09-12 ENCOUNTER — Ambulatory Visit (HOSPITAL_COMMUNITY)
Admission: RE | Admit: 2015-09-12 | Discharge: 2015-09-12 | Disposition: A | Discharge status: Home / Self Care | Payer: 59 | Source: Ambulatory Visit | Attending: Oncology | Admitting: Oncology

## 2015-09-12 ENCOUNTER — Encounter (HOSPITAL_COMMUNITY): Payer: Self-pay

## 2015-09-12 ENCOUNTER — Other Ambulatory Visit (HOSPITAL_COMMUNITY): Payer: 59

## 2015-09-12 ENCOUNTER — Other Ambulatory Visit: Payer: Self-pay | Admitting: Oncology

## 2015-09-12 DIAGNOSIS — C649 Malignant neoplasm of unspecified kidney, except renal pelvis: Secondary | ICD-10-CM

## 2015-09-12 DIAGNOSIS — C787 Secondary malignant neoplasm of liver and intrahepatic bile duct: Secondary | ICD-10-CM | POA: Insufficient documentation

## 2015-09-12 DIAGNOSIS — Z85118 Personal history of other malignant neoplasm of bronchus and lung: Secondary | ICD-10-CM | POA: Diagnosis not present

## 2015-09-12 DIAGNOSIS — Z905 Acquired absence of kidney: Secondary | ICD-10-CM | POA: Insufficient documentation

## 2015-09-12 DIAGNOSIS — C7951 Secondary malignant neoplasm of bone: Secondary | ICD-10-CM | POA: Insufficient documentation

## 2015-09-12 DIAGNOSIS — K8689 Other specified diseases of pancreas: Secondary | ICD-10-CM

## 2015-09-12 DIAGNOSIS — Z9221 Personal history of antineoplastic chemotherapy: Secondary | ICD-10-CM | POA: Insufficient documentation

## 2015-09-12 DIAGNOSIS — C7889 Secondary malignant neoplasm of other digestive organs: Secondary | ICD-10-CM | POA: Diagnosis not present

## 2015-09-12 DIAGNOSIS — Z902 Acquired absence of lung [part of]: Secondary | ICD-10-CM | POA: Insufficient documentation

## 2015-09-12 MED ORDER — GADOBENATE DIMEGLUMINE 529 MG/ML IV SOLN
20.0000 mL | Freq: Once | INTRAVENOUS | Status: AC | PRN
  Administered 2015-09-12: 18 mL via INTRAVENOUS

## 2015-09-12 MED ORDER — IOHEXOL 300 MG/ML  SOLN
100.0000 mL | Freq: Once | INTRAMUSCULAR | Status: AC | PRN
  Administered 2015-09-12: 100 mL via INTRAVENOUS

## 2015-09-14 ENCOUNTER — Ambulatory Visit (HOSPITAL_BASED_OUTPATIENT_CLINIC_OR_DEPARTMENT_OTHER): Payer: 59 | Admitting: Oncology

## 2015-09-14 ENCOUNTER — Telehealth: Payer: Self-pay | Admitting: Oncology

## 2015-09-14 VITALS — BP 128/92 | HR 104 | Temp 98.6°F | Resp 20 | Ht 75.0 in | Wt 194.0 lb

## 2015-09-14 DIAGNOSIS — C649 Malignant neoplasm of unspecified kidney, except renal pelvis: Secondary | ICD-10-CM

## 2015-09-14 MED ORDER — ALPRAZOLAM 1 MG PO TABS: 1.0000 mg | ORAL_TABLET | Freq: Three times a day (TID) | ORAL | Status: DC | PRN

## 2015-09-14 MED ORDER — CABOZANTINIB S-MALATE 60 MG PO TABS: 60.0000 mg | ORAL_TABLET | Freq: Every day | ORAL | Status: DC

## 2015-09-14 NOTE — Telephone Encounter (Signed)
per pof to sch pt appt-gave pt copy of avs °

## 2015-09-14 NOTE — Progress Notes (Signed)
Hematology and Oncology Follow Up Visit  Julias Mould [id number] [birth year] 49 y.o. 09/14/2015 11:00 AM  [name], MD  [name], M.D.  [name], MD  [name], MD    Principle Diagnosis: This is a 49 year old gentleman with stage IV renal cell carcinoma diagnosed in 2009.  Prior Therapy: 1. Status post laparoscopic radical nephrectomy.  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  2. Patient status post thoracotomy for a synchronous metastatic lung lesions done October 2009.  He had a lower lobe nodule, biopsy proven to be metastatic renal cell carcinoma.   3. Patient is status post stereotactic radiotherapy to pulmonary nodules in May of 2010. 4. He is S/P Sutent 50 mg 4 weeks on 2 weeks off from 10/2010 to 03/2013. He progressed at that time.  5. He is S/P radiation to the right sacral bone between 4/22 to 4/30.  6. He is S/P XRT to the left shoulder 03/20/14 to 03/31/14. 7. Votrient 800 mg by mouth daily from 03/2013 through 06/22/2015. Discontinued secondary to disease progression. 8. Nivolumab 3 mg/kg given every 2 weeks started on 06/29/2015. He is status post 4 cycles completed 08/10/2015. He developed disease progression in September 2016.    Current therapy:  Under evaluation to start salvage therapy in the form of Cabometyx.   Interim History:  Mr. Freer presents today for a followup visit. Since his last visit, he reports doing overall better specially from an appetite standpoint. He is able to eat well without any major complications. He was able to enjoy and we can at the beach without any decline. He continues to have worsening lower extremity pain. His pain is predominantly on the left side. With holding treatment and prednisone has helped the rest of his arthralgias and myalgias. He has reported much improved hip pain, ankle pain.  He does report a slight protrusion noted on the lateral aspect of his left leg which has not  changed in the last 2 weeks.    He does not report any other pain at this time. He feels the pain pump and Dilaudid have helped his hip pain in his back pain. He does not report any abdominal pain or change in his bowel habits. He does report occasional constipation with pain medication.    He has not reported anynausea or vomiting. He did not report any headaches blurred vision or double vision. Does not report any seizure activity or alteration of mental status. Is not reporting any chest pain or difficulty breathing. Is not reporting any cough or hemoptysis. Does not report any abdominal pain or hematochezia. Does not report any hematuria but does report occasional hesitancy and nocturia. He does not report any rashes or lesions or petechiae. He does not report any lymphadenopathy. His performance status and activity level remain relatively stable at this time. Remainder of his review of system is unremarkable.  Medications: I have reviewed the patient's current medications.  Current Outpatient Prescriptions  Medication Sig Dispense Refill  . ALPRAZolam (XANAX) 1 MG tablet Take 1 tablet (1 mg total) by mouth 3 (three) times daily as needed for anxiety. 100 tablet 0  . DULoxetine (CYMBALTA) 60 MG capsule Take 1 capsule (60 mg total) by mouth 2 (two) times daily. 60 capsule 2  . gabapentin (NEURONTIN) 300 MG capsule Take 1 capsule (300 mg total) by mouth 3 (three) times daily. 90 capsule 3  . HYDROmorphone (DILAUDID) 8 MG tablet Take 1 tablet (8 mg  total) by mouth every 4 (four) hours as needed for moderate pain or severe pain. 50 tablet 0  . megestrol (MEGACE) 400 MG/10ML suspension Take 10 mLs (400 mg total) by mouth 2 (two) times daily. 240 mL 0  . predniSONE (DELTASONE) 20 MG tablet Take 3 tablets daily for a week. Then,  Take 2 tablets daily for a week. Then,  Take one tablet daily for a week. Then stop. 42 tablet 0  . tiZANidine (ZANAFLEX) 4 MG tablet Take 4 mg by mouth every 8 (eight) hours  as needed for muscle spasms.   0  . VIAGRA 100 MG tablet Take 50 mg by mouth as needed for erectile dysfunction.     [name] Cabozantinib S-Malate (CABOMETYX) 60 MG TABS Take 60 mg by mouth daily. 60 tablet 0   No current facility-administered medications for this visit.     Allergies:  Allergies  Allergen Reactions  . Ceftriaxone Hives  . Hydrocodone Swelling    Past Medical History, Surgical history, Social history, and Family History were reviewed and updated.   Physical Exam: Blood pressure 128/92, pulse 104, temperature 98.6 F (37 C), temperature source Oral, resp. rate 20, height '6\' 3"'$  (1.905 m), weight 194 lb (87.998 kg), SpO2 99 %. ECOG: 1 General appearance: alert and awake gentleman without distress. Head: Normocephalic, without obvious abnormality.  Neck: no adenopathy, no masses.  Lymph nodes: Cervical, supraclavicular, and axillary nodes normal. Heart:regular rate and rhythm, S1, S2.  Lung:chest clear, no wheezing, rales, normal symmetric air entry. No dullness to percussion.  Abdomen: soft, non-tender, without masses or organomegaly EXT:. Very tender to touch. Slight protrusion noted on the left leg on the lateral aspect. Slight edema noted. Neuro: no focal deficits noted. No weakness noted in his lower extremities.  Lab Results: Lab Results  Component Value Date   WBC 7.3 08/24/2015   HGB 12.6* 08/24/2015   HCT 39.0 08/24/2015   MCV 101.8* 08/24/2015   PLT 210 08/24/2015    EXAM: CT CHEST, ABDOMEN, AND PELVIS WITH CONTRAST  TECHNIQUE: Multidetector CT imaging of the chest, abdomen and pelvis was performed following the standard protocol during bolus administration of intravenous contrast.  CONTRAST: 160m OMNIPAQUE IOHEXOL 300 MG/ML SOLN  COMPARISON: CT chest abdomen pelvis dated 06/20/2015.  FINDINGS: CT CHEST FINDINGS  Mediastinum/Nodes: The heart is normal in size. No pericardial effusion.  No suspicious mediastinal or axillary  lymphadenopathy. 2.6 x 2.7 cm left infrahilar node (series 6/image 48), previously 2.5 x 1.5 cm.  Visualized thyroid is unremarkable.  Lungs/Pleura: Status post wedge resection in the right lower lobe (series 8/ image 65). Associated volume loss in the right hemithorax.  Suspected radiation changes in the right upper lobe and along the right upper lobe bronchus (series 8/images 30 and 33).  Abnormal soft tissue/bronchial wall thickening along the right middle lobe bronchus (series 8/ image 61), grossly unchanged.  Underlying moderate centrilobular and paraseptal emphysematous changes.  No focal consolidation.  No pleural effusion or pneumothorax.  Musculoskeletal: Degenerative changes of the thoracic spine.  CT ABDOMEN PELVIS FINDINGS  Hepatobiliary: Liver is notable for an 11 mm enhancing lesion along the posterior segment right hepatic lobe (series 4/ image 29), essentially new, suspicious for metastases.  Gallbladder is unremarkable. No intrahepatic or extrahepatic ductal dilatation.  Pancreas: 3.4 x 2.9 cm enhancing mass along the anterior aspect of the pancreatic head/ body (series 4/ image 43), previously 2.7 x 2.4 cm. Additional 1.8 cm enhancing lesion in the pancreatic body (series 4/ image 35),  previously 1.5 cm, with additional small enhancing lesions in the pancreatic body/tail (series 4/ image 34). Associated atrophy of the pancreatic tail with ductal dilatation measuring up to 11 mm, previously 10 mm.  Spleen: Within normal limits.  Adrenals/Urinary Tract: Adrenal glands are within normal limits.  Right kidney is within normal limits. No hydronephrosis.  Status post left nephrectomy. No abnormal soft tissue in the surgical bed.  Stomach/Bowel: Stomach is within normal limits.  No evidence of bowel obstruction.  Normal appendix.  Left colon is underdistended.  Vascular/Lymphatic: No evidence of abdominal aortic  aneurysm.  No suspicious abdominopelvic lymphadenopathy.  Reproductive: Prostate is unremarkable.  Other: No abdominopelvic ascites.  Metallic hardware in the left anterior abdominal wall.  Musculoskeletal: Mild degenerative changes of the lumbar spine.  Right sacral metastasis with associated 2.9 x 2.4 cm soft tissue component (series 6/image 136), previously 2.8 x 2.2 cm.  IMPRESSION: Postsurgical/postradiation changes in the right hemithorax.  Mild progression of left infrahilar nodal metastasis. Right middle lobe peribronchial soft tissue is grossly unchanged.  Mild progression of pancreatic metastases, as described above. New hepatic metastasis.  Mild progression of right sacral metastasis.  Status post left nephrectomy.   EXAM: MRI OF LOWER LEFT EXTREMITY WITHOUT AND WITH CONTRAST  TECHNIQUE: Multiplanar, multisequence MR imaging of the left lower leg was performed both before and after administration of intravenous contrast.  CONTRAST: 18 ml MULTIHANCE GADOBENATE DIMEGLUMINE 529 MG/ML IV SOLN  COMPARISON: None.  FINDINGS: The axial and coronal sequences incidentally include the right lower leg. In the left lower leg, there is a large enhancing mass at the junction of the proximal and middle thirds of the left fibula. The lesion measures 3.6 cm transverse by 3.4 cm AP by 5.7 cm craniocaudal on postcontrast imaging and is consistent with a metastatic deposit. There is cortical destruction association with the lesion. No other focal lesion is identified in the left lower leg.  Incidental imaging of the right lower leg demonstrates an enhancing mass in the medial metaphysis of the right tibia which measures 4.5 cm transverse by 4.1 cm craniocaudal by 4.4 cm AP and is consistent with a metastatic lesion. Incidentally imaged right lower leg is otherwise unremarkable.  IMPRESSION: Large destructive lesions in left fibula and right tibia  as described above are consistent with metastatic renal cell carcinoma   Impression and Plan:  This is a pleasant 49 year old gentleman with the following issues. 1. Metastatic renal cell carcinoma.  He has documented disease to the lung and the pancreas.  CT scan from 06/20/2015 showed progression of disease. He is S/P Nivolumab 3 mg/kg given every 2 weeks started on 06/29/2015. After 4 cycles of therapy, he had poor tolerance to this medication including diffuse arthralgias and myalgias and rapid progression of his disease. CT scans obtained on 09/12/2015 showed increase in his pancreatic mass and his sacral mass. Given these findings different salvage therapy will be needed. Different options were reviewed today and I think he will be a reasonable candidate for Cabometyx. Risks and benefits of this medication were reviewed today. Also the data that led to the approval of this medication was discussed with the patient. Complications include diarrhea, hand-foot syndrome, hypertension, and others were reviewed. The plan is start with 60 mg dosing and potentially dose reduce if needed to. I plan on repeating imaging studies in 2-3 months. 2. Sacral metastasis: Recent CT scan have shown progression of disease in that area without pain. We'll continue to monitor that we will followed. 3.  Depression/Anxiety: He remains on Xanax and Cymbalta. His mood is slightly depressed but is more upbeat with the current plan.. 4. Weight loss: Likely related to progression of cancer. Megace have helped his symptoms and he is up to 194 pounds. The plan is to continue for the time being. 5. Neuropathic pain. He is on gabapentin and Dilaudid for pain. He also has a subcutaneous pain medication pump. His pain is under reasonable control. 6. Left lower extremity pain and a mass: MRI of the tibia was reviewed and showed a destructive lesion likely arising from the bony structure. I will refer him to Dr. Tammi Klippel from radiation  oncology to evaluate him for possible ablative radiotherapy. I do not think systemic therapy or oral analgesia would help with that pain. I think his quality of life would be improved dramatically with treatment of that lesion. 7. Hypertension: Controlled at this time. This will need to be monitored moving followed. 8. Follow up: in 3 weeks to discuss the results of his imaging studies.  Zola Button, MD

## 2015-09-19 ENCOUNTER — Encounter: Payer: Self-pay | Admitting: Radiation Oncology

## 2015-09-19 NOTE — Progress Notes (Signed)
Histology and Location of Primary Cancer: stage IV renal cell carcinoma diagnosed in 2009  Sites of Visceral and Bony Metastatic Disease: Mild progression of left infrahilar nodal metastasis. Right middle lobe peribronchial soft tissue is grossly unchanged. Mild progression of pancreatic metastases, as described above. New hepatic metastasis.  Mild progression of right sacral metastasis.  Location(s) of Symptomatic Metastases: Large destructive lesions in left fibula and right tibia   Past/Anticipated chemotherapy by medical oncology, if any: Prior Therapy: 1. Status post laparoscopic radical nephrectomy. 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 2. Patient status post thoracotomy for a synchronous metastatic lung lesions done October 2009. He had a lower lobe nodule, biopsy proven to be metastatic renal cell carcinoma.  3. Patient is status post stereotactic radiotherapy to pulmonary nodules in May of 2010. 4. He is S/P Sutent 50 mg 4 weeks on 2 weeks off from 10/2010 to 03/2013. He progressed at that time.  5. He is S/P radiation to the right sacral bone between 4/22 to 4/30.  6. He is S/P XRT to the left shoulder 03/20/14 to 03/31/14. 7. Votrient 800 mg by mouth daily from 03/2013 through 06/22/2015. Discontinued secondary to disease progression. 8. Nivolumab 3 mg/kg given every 2 weeks started on 06/29/2015. He is status post 4 cycles completed 08/10/2015. He developed disease progression in September 2016.   Current therapy: Under evaluation to start salvage therapy in the form of Cabometyx.    Pain on a scale of 0-10 is: stabbing pain with quality which is up to 10 out of 10 at times   If Spine Met(s), symptoms, if any, include: 9. Bowel/Bladder retention or incontinence (please describe): NO 10. Numbness or weakness in extremities (please describe): reports numbness in her toes  11. Current Decadron regimen, if applicable: completed prednisone  taper monday  Ambulatory status? Walker? Wheelchair?: ambulatory  SAFETY ISSUES:  Prior radiation? YES  Pacemaker/ICD? NO  Possible current pregnancy? NO  Is the patient on methotrexate? NO  Current Complaints / other details: 49 year old male. Referred by [facility] for palliative radiation treatment.

## 2015-09-20 ENCOUNTER — Ambulatory Visit
Admission: RE | Admit: 2015-09-20 | Discharge: 2015-09-20 | Disposition: A | Discharge status: Home / Self Care | Payer: 59 | Source: Ambulatory Visit | Attending: Radiation Oncology | Admitting: Radiation Oncology

## 2015-09-20 ENCOUNTER — Encounter: Payer: Self-pay | Admitting: Radiation Oncology

## 2015-09-20 ENCOUNTER — Emergency Department (HOSPITAL_COMMUNITY)
Admission: EM | Admit: 2015-09-20 | Discharge: 2015-09-20 | Disposition: A | Discharge status: Home / Self Care | Payer: 59 | Attending: Emergency Medicine | Admitting: Emergency Medicine

## 2015-09-20 ENCOUNTER — Encounter (HOSPITAL_COMMUNITY): Payer: Self-pay | Admitting: Emergency Medicine

## 2015-09-20 VITALS — BP 132/90 | HR 87 | Resp 16 | Ht 71.0 in | Wt 191.2 lb

## 2015-09-20 DIAGNOSIS — I10 Essential (primary) hypertension: Secondary | ICD-10-CM | POA: Insufficient documentation

## 2015-09-20 DIAGNOSIS — Z79899 Other long term (current) drug therapy: Secondary | ICD-10-CM | POA: Insufficient documentation

## 2015-09-20 DIAGNOSIS — Z85528 Personal history of other malignant neoplasm of kidney: Secondary | ICD-10-CM | POA: Insufficient documentation

## 2015-09-20 DIAGNOSIS — Z720 Tobacco use: Secondary | ICD-10-CM | POA: Diagnosis not present

## 2015-09-20 DIAGNOSIS — K219 Gastro-esophageal reflux disease without esophagitis: Secondary | ICD-10-CM | POA: Diagnosis not present

## 2015-09-20 DIAGNOSIS — R739 Hyperglycemia, unspecified: Secondary | ICD-10-CM | POA: Diagnosis not present

## 2015-09-20 DIAGNOSIS — C7951 Secondary malignant neoplasm of bone: Secondary | ICD-10-CM

## 2015-09-20 DIAGNOSIS — Z85118 Personal history of other malignant neoplasm of bronchus and lung: Secondary | ICD-10-CM | POA: Diagnosis not present

## 2015-09-20 DIAGNOSIS — F419 Anxiety disorder, unspecified: Secondary | ICD-10-CM | POA: Diagnosis not present

## 2015-09-20 DIAGNOSIS — Z8507 Personal history of malignant neoplasm of pancreas: Secondary | ICD-10-CM | POA: Diagnosis not present

## 2015-09-20 DIAGNOSIS — Z510 Encounter for antineoplastic radiation therapy: Secondary | ICD-10-CM | POA: Diagnosis not present

## 2015-09-20 DIAGNOSIS — R358 Other polyuria: Secondary | ICD-10-CM | POA: Diagnosis present

## 2015-09-20 LAB — CBG MONITORING, ED
Glucose-Capillary: >600 mg/dL (ref 65–99)
Glucose-Capillary: 442 mg/dL — ABNORMAL HIGH (ref 65–99)
Glucose-Capillary: 315 mg/dL — ABNORMAL HIGH (ref 65–99)

## 2015-09-20 LAB — BASIC METABOLIC PANEL
Anion gap: 9 (ref 5–15)
BUN: 14 mg/dL (ref 6–20)
CO2: 25 mmol/L (ref 22–32)
Calcium: 9.2 mg/dL (ref 8.9–10.3)
Chloride: 90 mmol/L — ABNORMAL LOW (ref 101–111)
Creatinine, Ser: 1.17 mg/dL (ref 0.61–1.24)
GFR calc Af Amer: >60 mL/min (ref >60)
GFR calc non Af Amer: >60 mL/min (ref >60)
Glucose, Bld: 841 mg/dL (ref 65–99)
Potassium: 3.9 mmol/L (ref 3.5–5.1)
Sodium: 124 mmol/L — ABNORMAL LOW (ref 135–145)

## 2015-09-20 LAB — CBC WITH DIFFERENTIAL/PLATELET
Basophils Absolute: 0 10*3/uL (ref 0.0–0.1)
Basophils Relative: 0 %
Eosinophils Absolute: 0.1 10*3/uL (ref 0.0–0.7)
Eosinophils Relative: 1 %
HCT: 40 % (ref 39.0–52.0)
Hemoglobin: 13.6 g/dL (ref 13.0–17.0)
Lymphocytes Relative: 20 %
Lymphs Abs: 2.2 10*3/uL (ref 0.7–4.0)
MCH: 33.1 pg (ref 26.0–34.0)
MCHC: 34 g/dL (ref 30.0–36.0)
MCV: 97.3 fL (ref 78.0–100.0)
Monocytes Absolute: 0.5 10*3/uL (ref 0.1–1.0)
Monocytes Relative: 4 %
Neutro Abs: 8.1 10*3/uL — ABNORMAL HIGH (ref 1.7–7.7)
Neutrophils Relative %: 75 %
Platelets: 213 10*3/uL (ref 150–400)
RBC: 4.11 MIL/uL — ABNORMAL LOW (ref 4.22–5.81)
RDW: 14.3 % (ref 11.5–15.5)
WBC: 10.9 10*3/uL — ABNORMAL HIGH (ref 4.0–10.5)

## 2015-09-20 LAB — URINALYSIS, ROUTINE W REFLEX MICROSCOPIC
Bilirubin Urine: NEGATIVE
Glucose, UA: >1000 mg/dL — AB
Hgb urine dipstick: NEGATIVE
Ketones, ur: NEGATIVE mg/dL
Leukocytes, UA: NEGATIVE
Nitrite: NEGATIVE
Protein, ur: NEGATIVE mg/dL
Specific Gravity, Urine: 1.036 — ABNORMAL HIGH (ref 1.005–1.030)
Urobilinogen, UA: 0.2 mg/dL (ref 0.0–1.0)
pH: 6 (ref 5.0–8.0)

## 2015-09-20 LAB — URINE MICROSCOPIC-ADD ON: Urine-Other: NONE SEEN

## 2015-09-20 LAB — GLUCOSE, CAPILLARY: Glucose-Capillary: >600 mg/dL (ref 65–99)

## 2015-09-20 MED ORDER — SODIUM CHLORIDE 0.9 % IV BOLUS (SEPSIS)
2000.0000 mL | Freq: Once | INTRAVENOUS | Status: AC
  Administered 2015-09-20: 2000 mL via INTRAVENOUS

## 2015-09-20 MED ORDER — INSULIN ASPART 100 UNIT/ML ~~LOC~~ SOLN
5.0000 [IU] | Freq: Once | SUBCUTANEOUS | Status: AC
  Administered 2015-09-20: 5 [IU] via INTRAVENOUS
  Filled 2015-09-20: qty 1

## 2015-09-20 MED ORDER — INSULIN ASPART 100 UNIT/ML ~~LOC~~ SOLN
8.0000 [IU] | Freq: Once | SUBCUTANEOUS | Status: AC
  Administered 2015-09-20: 8 [IU] via INTRAVENOUS
  Filled 2015-09-20: qty 1

## 2015-09-20 MED ORDER — SODIUM CHLORIDE 0.9 % IV BOLUS (SEPSIS)
1000.0000 mL | Freq: Once | INTRAVENOUS | Status: AC
  Administered 2015-09-20: 1000 mL via INTRAVENOUS

## 2015-09-20 NOTE — Discharge Instructions (Signed)

## 2015-09-20 NOTE — Progress Notes (Signed)
Patient reports he is cold all the time, has urinary frequency, and is always thirsty. Reports blurry vision. Checked blood sugar twice. Both times obtained readings over 600. Informed physician of finding. Wheeled patient to ED.
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
## 2015-09-20 NOTE — Progress Notes (Signed)
Radiation Oncology         [phone] ________________________________  Name: Keith [name] MRN: [id number]  Date: 09/20/2015  DOB: 1966-10-22  Follow-Up Visit Note    CC: Keith Pel, MD  Keith Portela, MD  Diagnosis:   49 yo man with a 6 cm destructive left mid fibula metastases from renal cell carcinoma  1. May 2011 - stereotactic body radiotherapy to three isolated pulmonary metastases (1.2 cm in the right upper lobe, 5 mm in the right upper lobe and 1 cm in the right lung base) to 50 Gy in five fractions  2. 4/22-30/2014 isolated right sacral oligometastatic deposit to 50 gray in 5 fractions  3.  03/20/2014-03/31/2014 The patient's left shoulder was treated to 30 gray in 10 fractions   Interval Since Last Radiation:  1 year 5 months  Narrative:  The patient returns today for routine follow-up.  Patient reports he is cold all the time, has urinary frequency, and is always thirsty. Reports blurry vision.  He finished a course of prednisone recently. He has been experiencing pain in both of his legs. Reports trouble walking. He notes a palpable mass on his left lower extremity.                             ALLERGIES:  is allergic to ceftriaxone and hydrocodone.  Meds: Current Outpatient Prescriptions  Medication Sig Dispense Refill  . ALPRAZolam (XANAX) 1 MG tablet Take 1 tablet (1 mg total) by mouth 3 (three) times daily as needed for anxiety. 100 tablet 0  . Cabozantinib S-Malate (CABOMETYX) 60 MG TABS Take 60 mg by mouth daily. 60 tablet 0  . DULoxetine (CYMBALTA) 60 MG capsule Take 1 capsule (60 mg total) by mouth 2 (two) times daily. 60 capsule 2  . gabapentin (NEURONTIN) 300 MG capsule Take 1 capsule (300 mg total) by mouth 3 (three) times daily. 90 capsule 3  . HYDROmorphone (DILAUDID) 8 MG tablet Take 1 tablet (8 mg total) by mouth every 4 (four) hours as needed for moderate pain or severe pain. 50 tablet 0  . megestrol (MEGACE) 400 MG/10ML suspension Take 10  mLs (400 mg total) by mouth 2 (two) times daily. 240 mL 0  . tiZANidine (ZANAFLEX) 4 MG tablet Take 4 mg by mouth every 8 (eight) hours as needed for muscle spasms.   0  . VIAGRA 100 MG tablet Take 50 mg by mouth as needed for erectile dysfunction.      No current facility-administered medications for this encounter.    Physical Findings: The patient is in no acute distress. Patient is alert and oriented.  height is '5\' 11"'$  (1.803 m) and weight is 191 lb 3.2 oz (86.728 kg). His blood pressure is 132/90 and his pulse is 87. His respiration is 16 and oxygen saturation is 100%. .  No significant changes. There is a diffuse fullness on the lateral left calf which is tender to palpation without a discrete mass. He had pain with external rotation of left foot.   Lab Findings: Lab Results  Component Value Date   WBC 7.3 08/24/2015   HGB 12.6* 08/24/2015   HCT 39.0 08/24/2015   MCV 101.8* 08/24/2015   PLT 210 08/24/2015   In-Office Labs:  Checked blood sugar twice. Both times obtained readings over 600. (His brother is diabetic).   Impression: Keith [name] is a pleasant 49 year old man with a 6 cm destructive left mid fibula  metastases from renal cell carcinoma. He may benefit from palliative radiation. It is not clear whether the fibula will be functional without surgical stabilization. However, given the patient's medical condition I would prefer continuing with radiation therapy alone initially reserving orthopedic stabilization for later if absolutely necessary.  The patient appears to have new onset diabetes.  It is not clear whether this was precipitated by recent prednisone course, enlarging pancreas lesions, both or neither.  Plan:  Keith [name] was taken to the ED after our appointment for management of his high blood sugar.   The patient has been scheduled for simulation and treatment planning tomorrow.   This document serves as a record of services personally performed by Tyler Pita,  MD. It was created on his behalf by Arlyce Harman, a trained medical scribe. The creation of this record is based on the scribe's personal observations and the provider's statements to them. This document has been checked and approved by the attending provider.    _____________________________________  Sheral Apley. Tammi Klippel, M.D.

## 2015-09-20 NOTE — Addendum Note (Signed)
Encounter addended by: Heywood Footman, RN on: 09/20/2015  5:01 PM<BR>     Documentation filed: Charges VN

## 2015-09-20 NOTE — ED Notes (Signed)
Bed: WA01 Expected date:  Expected time:  Means of arrival:  Comments: Hold cancerctr/hyperglycemic

## 2015-09-20 NOTE — ED Notes (Signed)
Per cancer center RN, pt had a consult for bone radiation today , and while assessing RN assessed glucose level finding high sugar over 600 per glucometer. Per RN pt reported poluria and thurst. Cold chills. Pt had recvent treatment of Prednisone x 3 weeks  tapered off Monday. Pt is alert and oriented x 4 yet reports blurry vision.

## 2015-09-21 ENCOUNTER — Telehealth: Payer: Self-pay | Admitting: Radiation Oncology

## 2015-09-21 ENCOUNTER — Ambulatory Visit
Admission: RE | Admit: 2015-09-21 | Discharge: 2015-09-21 | Disposition: A | Discharge status: Home / Self Care | Payer: 59 | Source: Ambulatory Visit | Attending: Radiation Oncology | Admitting: Radiation Oncology

## 2015-09-21 LAB — GLUCOSE, CAPILLARY: Glucose-Capillary: >600 mg/dL (ref 65–99)

## 2015-09-21 NOTE — Telephone Encounter (Signed)
Noted patient was discharged from the emergency room last night following episode of hyperglycemia. Phoned to inquire of status. Patient reports he is scheduled to follow up with his PCP reference elevated blood sugar on October 5th. Reminded patient of simulation appointment and patient confirms he plans to be present.

## 2015-09-25 ENCOUNTER — Telehealth: Payer: Self-pay | Admitting: Radiation Oncology

## 2015-09-25 NOTE — Telephone Encounter (Signed)
Patient's wife, [name], left a message requesting a return call. Phoned her back at 1414 on 09/24/2015. She requested clarification of recent scan report she saw on MyChart. Confirmed her understanding. She expressed concern that the patient would not handle his disease progression well if he truly understood the extent of his disease. Triggered Wadie Lessen, NP.

## 2015-09-26 ENCOUNTER — Encounter: Payer: Self-pay | Admitting: *Deleted

## 2015-09-26 NOTE — Telephone Encounter (Signed)
If Keith [name] is here 10/10, they can come in and see her that morning?

## 2015-09-27 ENCOUNTER — Telehealth: Payer: Self-pay | Admitting: Radiation Oncology

## 2015-09-27 ENCOUNTER — Encounter: Payer: Self-pay | Admitting: Internal Medicine

## 2015-09-27 ENCOUNTER — Telehealth: Payer: Self-pay | Admitting: Internal Medicine

## 2015-09-27 NOTE — Telephone Encounter (Signed)
Opened in error

## 2015-09-27 NOTE — Progress Notes (Unsigned)
Patient ID: Keith [name], male   DOB: 02/16/1966, 49 y.o.   MRN: [id number]  This NP was contacted for Palliative needs on patient Keith [name].  Huntley Knoop wife of patient called me with many concerns.  Active listening for fears and frustrations of living with as spouse of a patient with terminal illness.   She has great concern over recent scan results.  She tells me that she read of progression to liver but that her husband does not know the "real" extent of his disease. "I don't think he really understands what is going on" She worries about "what he will do when he finds out".  Questioned further and there are no weapons in the home  I explained my role as Palliative NP working with Radiation-Oncology.    I encouraged her to talk with the oncologist [name] for clarification of scan results and to request an as soon as possible appointment.  I explained that I would be happy to meet with them on a day they have an appointment with Dr Tammi Klippel.  I did call Dr Hazeline Junker office and spoke with his nurse Dixie and then [name] directly to explain the situation. Dixie told me that he was to come in to fill out paperwork in order to receive  a new medication.  I explained to her that patient and his wife only know of the next appointment being October 14th.   They tell me they are unaware of paperwork.  [name] informed me that his office would take care of this situation, I informed Joaquim Lai RN with Dr Tammi Klippel  Please call if I can be of assistance  Wadie Lessen  NP # [phone]

## 2015-09-27 NOTE — Telephone Encounter (Signed)
Patient's wife, Keith [name], phoned, left a message and requested a return call. Phoned Penny back. Penny states,"Tyreon saw his PCP today..he recommended Ayham pursue palliative care but, Abanoub doesn't understand why.Marland Kitcheni don't know what to do." Per Dr. Johny Shears order offered an appointment for Friday to see Dr. Tammi Klippel, Wadie Lessen and obtain CT/Simulation for left leg. Wife reports she will phone this RN back Friday morning after she speaks with her boss about get off work.

## 2015-09-28 ENCOUNTER — Encounter: Payer: Self-pay | Admitting: *Deleted

## 2015-09-28 ENCOUNTER — Telehealth: Payer: Self-pay | Admitting: Pharmacist

## 2015-09-28 NOTE — Telephone Encounter (Signed)
Rx for Cabometyx sent to [facility] outpatient pharmacy for processing. Requires PA. Faxed to [place] reaching out to patient via telephone regarding Cabometyx assistance. Through [employer] access services there is a free 15 day trial program that patient may qualify for. We need his signature on the forms. I left a message for patient to call back or come in today to sign forms.   Thank you,  Montel Clock, PharmD, [facility]

## 2015-10-02 ENCOUNTER — Telehealth: Payer: Self-pay | Admitting: Radiation Oncology

## 2015-10-02 NOTE — Telephone Encounter (Signed)
Phoned patient's home to inquire about rescheduling simulation for fibula. Patient confirms he remains ambulatory but, his leg pain is severe. Reports his blood sugar today was 301 but, his PCP is managing this with insulin injections bid. Patient agreed to come down for simulation on Friday at 1400 following his appointment with [facility].

## 2015-10-03 IMAGING — MR MR HUMERUS*L* WO/W CM
4 of 9 series · 19 of 40 positions shown · IV contrast (Y)
Comparison: None.

CLINICAL DATA: History renal cell carcinoma. Widespread metastatic
disease. Patient complaining of mid shaft left humeral pain.

EXAM:
MRI OF THE LEFT HUMERUS WITHOUT AND WITH CONTRAST
TECHNIQUE: Multiplanar, multisequence MR imaging was performed both before and
after administration of intravenous contrast.
CONTRAST:  20mL MULTIHANCE GADOBENATE DIMEGLUMINE 529 MG/ML IV SOLN

[Series 4: T1 · axial · 6.0mm · 0.43mm/px · z∈[-241,+115]mm · 7 of 48 slices shown (1 of 2)]
[im 1/48]
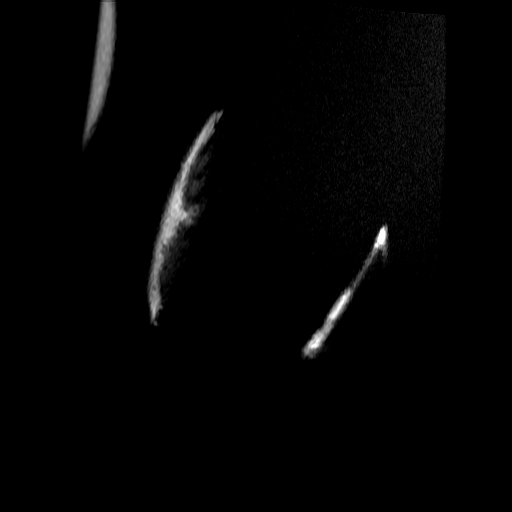
[im 8/48]
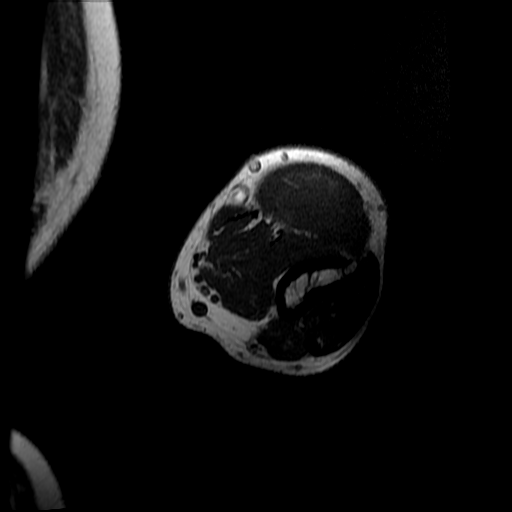
[im 16/48]
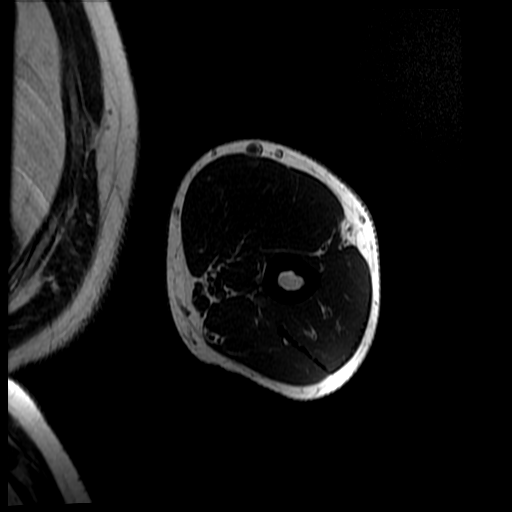
[im 24/48]
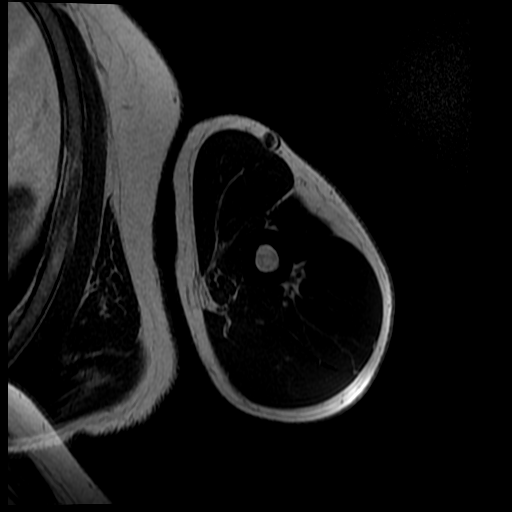
[im 32/48]
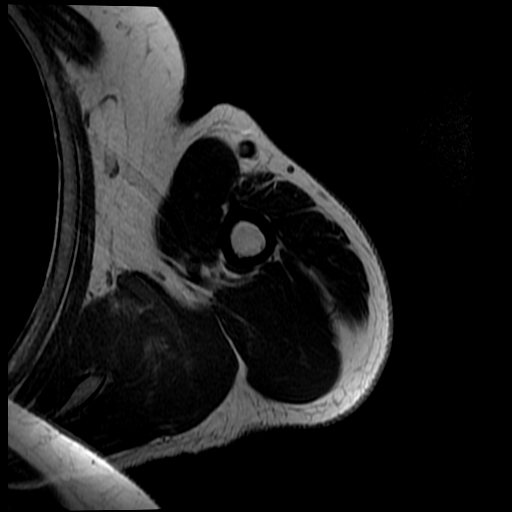
[im 40/48]
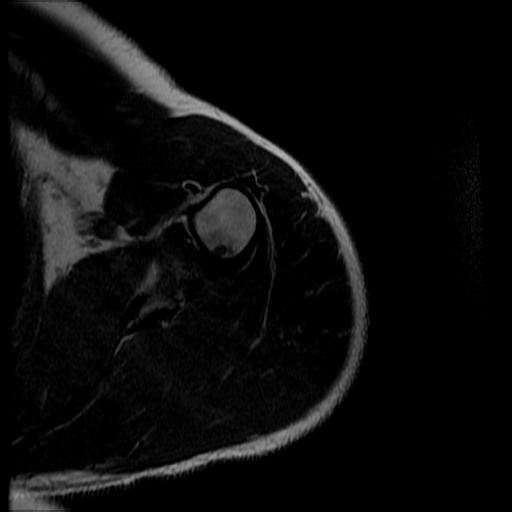
[im 48/48]
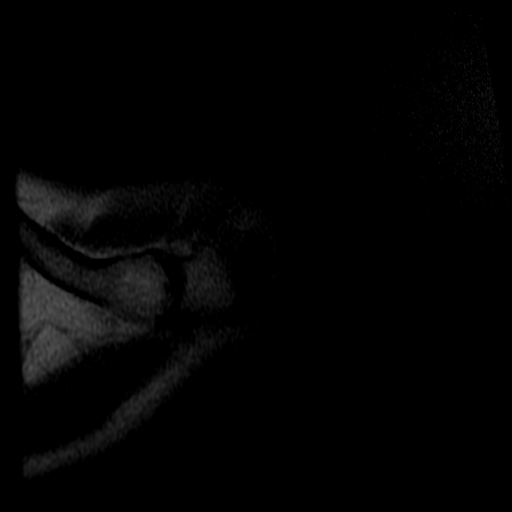

[Series 5: T2 fat-sat · axial · 6.0mm · 0.43mm/px · z∈[-241,+115]mm · 6 of 48 slices shown]
[im 1/48]
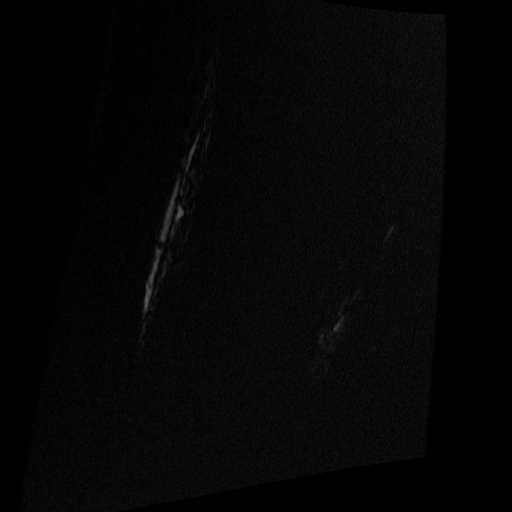
[im 10/48]
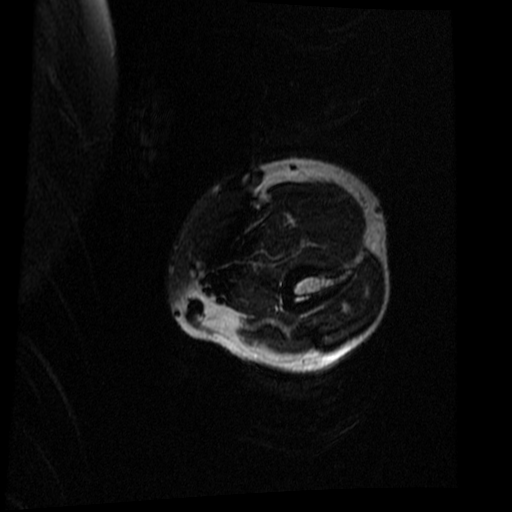
[im 19/48]
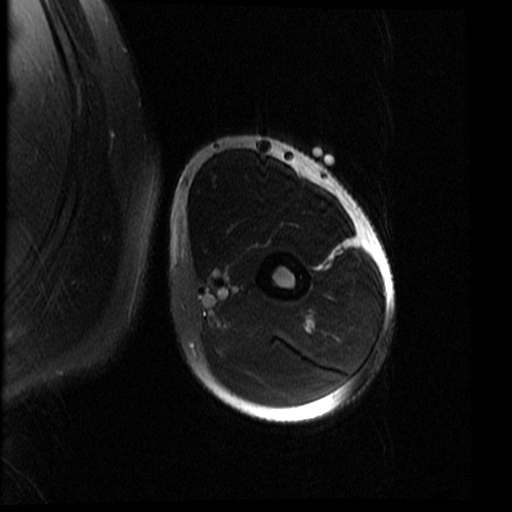
[im 29/48]
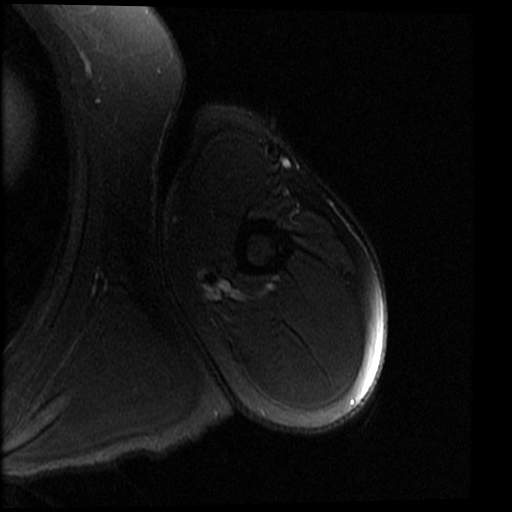
[im 38/48]
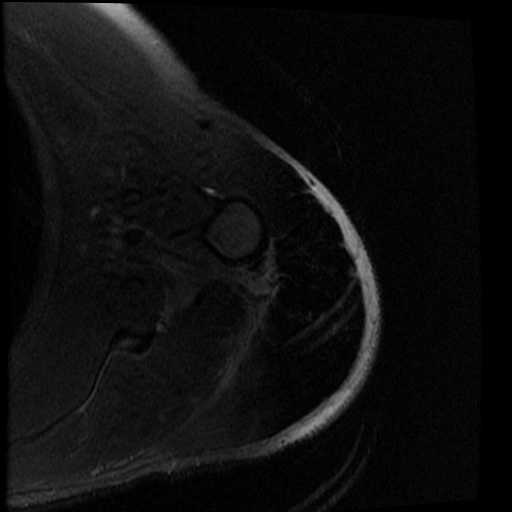
[im 48/48]
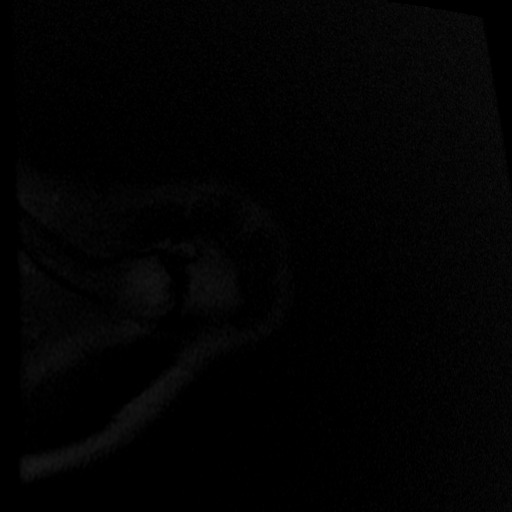

[Series 6: T1 · coronal · 4.0mm · 0.70mm/px · 3 of 28 slices shown (2 of 2)]
[im 1/28]
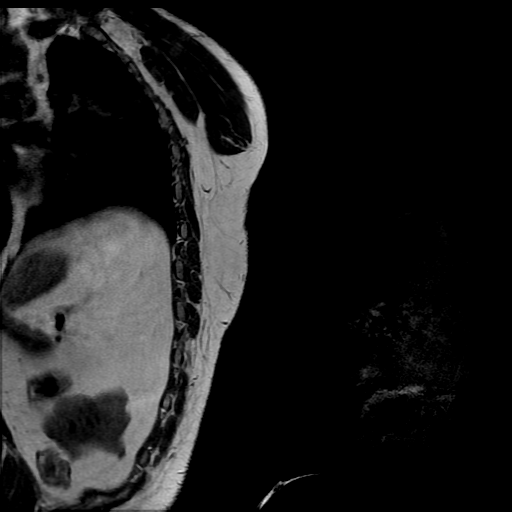
[im 14/28]
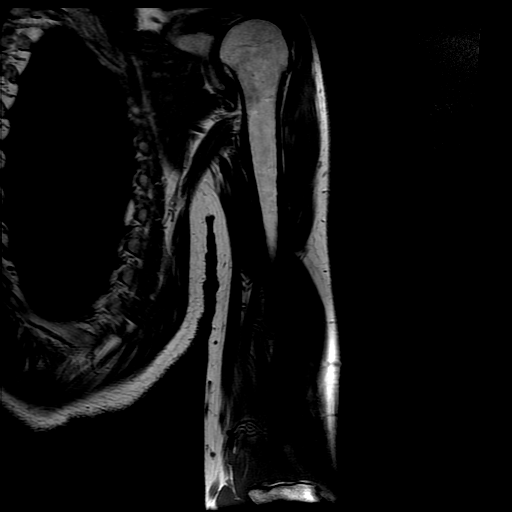
[im 28/28]
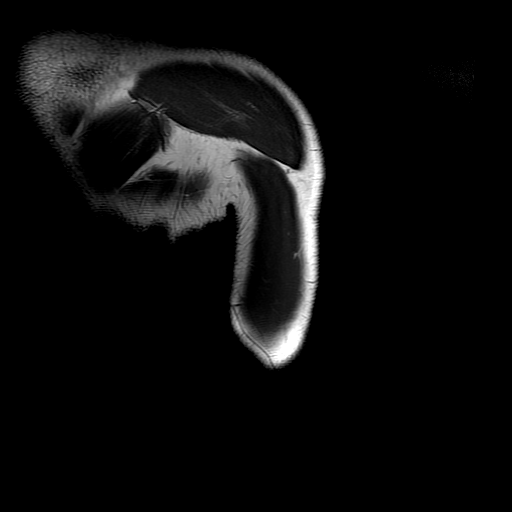

[Series 9: T1 fat-sat · axial · 6.0mm · 0.43mm/px · z∈[-173,+115]mm · 3 of 48 slices shown]
[im 10/48]
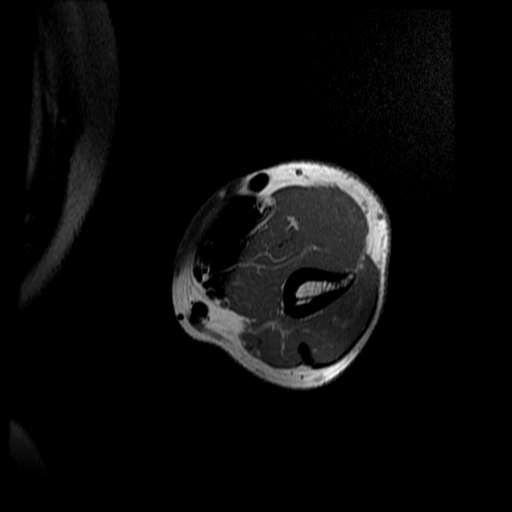
[im 29/48]
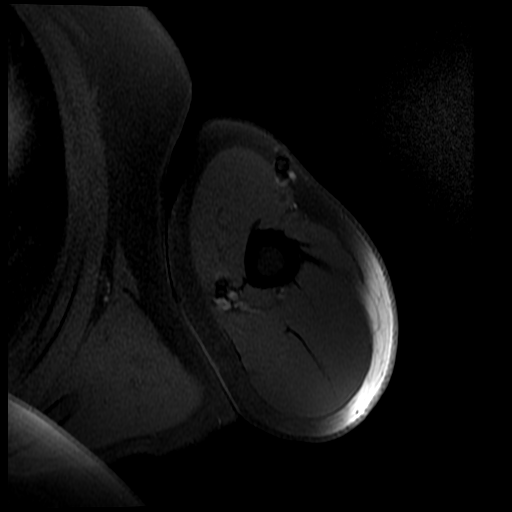
[im 48/48]
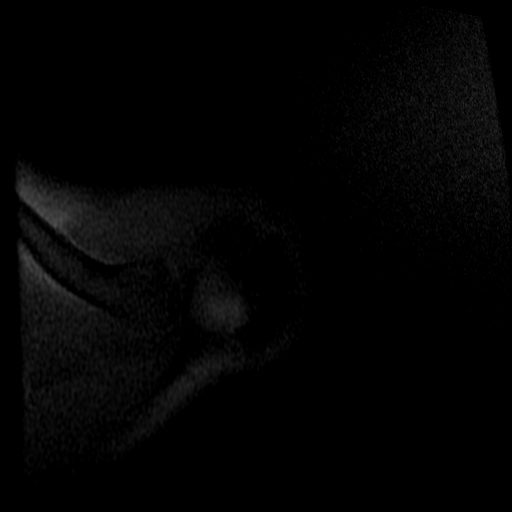

[19 of 40 positions shown; findings below may reference images not displayed]

FINDINGS: There is a 6.7 mm T1 hypo intense lesion (series 4/image 9) in the
proximal humeral meta diaphysis with enhancement on postcontrast
images (series 12/ image 14). There is no periosteal reaction.

There is no other marrow signal abnormality within the IT left
humerus. The muscles are normal in signal. There is no enhancing
soft tissue mass. There is no fluid collection.

There is no fracture.  There is no dislocation.
IMPRESSION: There is a 6.7 mm lesion in the proximal humeral metadiaphysis
concerning for metastatic disease given the patient's history of
renal cell carcinoma.

## 2015-10-03 NOTE — Telephone Encounter (Signed)
This is a clinically and socially complicated case.  Let's cancel the sim and await Shadad's visit before deciding what to do.

## 2015-10-04 ENCOUNTER — Telehealth: Payer: Self-pay | Admitting: Pharmacist

## 2015-10-04 NOTE — Telephone Encounter (Signed)
Oral chemo update:  10/13 - Pt has not started Cabometyx. [employer] is still processing Prior Auth on Cabometyx for Keith [name]. I was told their turn around time is up to 14 days. I voiced that this time frame is unacceptable and creates too much of a delay.   Through [employer] access services there is a free 15 day trial program that patient may qualify for. We need his signature on the forms. We have been trying to get patient's signature for 2 weeks.   I have called patient and left messages daily since Friday 10/7 for patient to call back or come in to sign forms. He is scheduled for follow up visit with Dr. [name] on Friday 10/14. Can have patient sign forms at this time.    Thank you,  Montel Clock, PharmD, [facility]

## 2015-10-05 ENCOUNTER — Encounter: Payer: Self-pay | Admitting: Pharmacist

## 2015-10-05 ENCOUNTER — Ambulatory Visit
Admission: RE | Admit: 2015-10-05 | Discharge: 2015-10-05 | Disposition: A | Discharge status: Home / Self Care | Payer: 59 | Source: Ambulatory Visit | Attending: Radiation Oncology | Admitting: Radiation Oncology

## 2015-10-05 ENCOUNTER — Ambulatory Visit: Payer: 59 | Admitting: Radiation Oncology

## 2015-10-05 ENCOUNTER — Telehealth: Payer: Self-pay | Admitting: Oncology

## 2015-10-05 ENCOUNTER — Other Ambulatory Visit: Payer: Self-pay | Admitting: *Deleted

## 2015-10-05 ENCOUNTER — Other Ambulatory Visit (HOSPITAL_BASED_OUTPATIENT_CLINIC_OR_DEPARTMENT_OTHER): Payer: 59

## 2015-10-05 ENCOUNTER — Ambulatory Visit (HOSPITAL_BASED_OUTPATIENT_CLINIC_OR_DEPARTMENT_OTHER): Payer: 59 | Admitting: Oncology

## 2015-10-05 VITALS — BP 118/83 | HR 96 | Temp 98.4°F | Resp 18 | Ht 71.0 in | Wt 190.5 lb

## 2015-10-05 DIAGNOSIS — R739 Hyperglycemia, unspecified: Secondary | ICD-10-CM | POA: Diagnosis not present

## 2015-10-05 DIAGNOSIS — C7801 Secondary malignant neoplasm of right lung: Secondary | ICD-10-CM | POA: Diagnosis not present

## 2015-10-05 DIAGNOSIS — C649 Malignant neoplasm of unspecified kidney, except renal pelvis: Secondary | ICD-10-CM

## 2015-10-05 DIAGNOSIS — C7989 Secondary malignant neoplasm of other specified sites: Secondary | ICD-10-CM | POA: Diagnosis not present

## 2015-10-05 DIAGNOSIS — C642 Malignant neoplasm of left kidney, except renal pelvis: Secondary | ICD-10-CM

## 2015-10-05 DIAGNOSIS — C7951 Secondary malignant neoplasm of bone: Secondary | ICD-10-CM

## 2015-10-05 DIAGNOSIS — K8689 Other specified diseases of pancreas: Secondary | ICD-10-CM

## 2015-10-05 DIAGNOSIS — Z5111 Encounter for antineoplastic chemotherapy: Secondary | ICD-10-CM

## 2015-10-05 LAB — CBC WITH DIFFERENTIAL/PLATELET
BASO%: 0.3 % (ref 0.0–2.0)
Basophils Absolute: 0 10*3/uL (ref 0.0–0.1)
EOS%: 2.1 % (ref 0.0–7.0)
Eosinophils Absolute: 0.2 10*3/uL (ref 0.0–0.5)
HCT: 35.2 % — ABNORMAL LOW (ref 38.4–49.9)
HGB: 11.7 g/dL — ABNORMAL LOW (ref 13.0–17.1)
LYMPH%: 27.9 % (ref 14.0–49.0)
MCH: 31.7 pg (ref 27.2–33.4)
MCHC: 33.2 g/dL (ref 32.0–36.0)
MCV: 95.4 fL (ref 79.3–98.0)
MONO#: 0.4 10*3/uL (ref 0.1–0.9)
MONO%: 5.9 % (ref 0.0–14.0)
NEUT#: 4.6 10*3/uL (ref 1.5–6.5)
NEUT%: 63.8 % (ref 39.0–75.0)
Platelets: 203 10*3/uL (ref 140–400)
RBC: 3.69 10*6/uL — ABNORMAL LOW (ref 4.20–5.82)
RDW: 13.8 % (ref 11.0–14.6)
WBC: 7.3 10*3/uL (ref 4.0–10.3)
lymph#: 2 10*3/uL (ref 0.9–3.3)

## 2015-10-05 LAB — COMPREHENSIVE METABOLIC PANEL (CC13)
ALT: 16 U/L (ref 0–55)
AST: 15 U/L (ref 5–34)
Albumin: 2.8 g/dL — ABNORMAL LOW (ref 3.5–5.0)
Alkaline Phosphatase: 130 U/L (ref 40–150)
Anion Gap: 9 mEq/L (ref 3–11)
BUN: 6.8 mg/dL — ABNORMAL LOW (ref 7.0–26.0)
CO2: 25 mEq/L (ref 22–29)
Calcium: 9.9 mg/dL (ref 8.4–10.4)
Chloride: 102 mEq/L (ref 98–109)
Creatinine: 0.9 mg/dL (ref 0.7–1.3)
EGFR: >90 mL/min/{1.73_m2} (ref >90)
Glucose: 248 mg/dl — ABNORMAL HIGH (ref 70–140)
Potassium: 3.8 mEq/L (ref 3.5–5.1)
Sodium: 135 mEq/L — ABNORMAL LOW (ref 136–145)
Total Bilirubin: <0.3 mg/dL (ref 0.20–1.20)
Total Protein: 6.9 g/dL (ref 6.4–8.3)

## 2015-10-05 MED ORDER — MEGESTROL ACETATE 400 MG/10ML PO SUSP: 400.0000 mg | Freq: Two times a day (BID) | ORAL | Status: DC

## 2015-10-05 NOTE — Progress Notes (Signed)
Hematology and Oncology Follow Up Visit  Zebbie Ace [id number] Aug 25, 1966 49 y.o. 10/05/2015 1:04 PM  [name], MD  [name], M.D.  [name], MD  [name], MD    Principle Diagnosis: This is a 49 year old gentleman with stage IV renal cell carcinoma diagnosed in 2009.  Prior Therapy: 1. Status post laparoscopic radical nephrectomy.  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  2. Patient status post thoracotomy for a synchronous metastatic lung lesions done October 2009.  He had a lower lobe nodule, biopsy proven to be metastatic renal cell carcinoma.   3. Patient is status post stereotactic radiotherapy to pulmonary nodules in May of 2010. 4. He is S/P Sutent 50 mg 4 weeks on 2 weeks off from 10/2010 to 03/2013. He progressed at that time.  5. He is S/P radiation to the right sacral bone between 4/22 to 4/30.  6. He is S/P XRT to the left shoulder 03/20/14 to 03/31/14. 7. Votrient 800 mg by mouth daily from 03/2013 through 06/22/2015. Discontinued secondary to disease progression. 8. Nivolumab 3 mg/kg given every 2 weeks started on 06/29/2015. He is status post 4 cycles completed 08/10/2015. He developed disease progression in September 2016.    Current therapy:  Under evaluation to start salvage therapy in the form of Cabometyx.   Interim History:  Mr. Rabinovich presents today for a followup visit. Since his last visit, he reports eating better and has gained more weight. He continues to be very anxious and stressed about his cancer progression. He reports that he is trying to avoid talking to people and have problems with sleeping.  He denied homicidal or suicidal ideation. He is very concerned about the fact that he has liver lesion detected on his previous CT scan.   He was seen in the emergency department on 09/20/2015 for hypoglycemia. He has been taken off prednisone at this time. He has not reported any confusion or polyuria or  dyspepsia.  He continues to complain predominantly of pain in his left foot. He does not report any other pain at this time. He feels the pain pump and Dilaudid have helped his hip pain in his back pain. He does not report any abdominal pain or change in his bowel habits. He does report occasional constipation with pain medication.   He has not reported any nausea or vomiting. He did not report any headaches blurred vision or double vision. Does not report any seizure activity or alteration of mental status. Is not reporting any chest pain or difficulty breathing. Is not reporting any cough or hemoptysis. Does not report any abdominal pain or hematochezia. Does not report any hematuria but does report occasional hesitancy and nocturia. He does not report any rashes or lesions or petechiae. He does not report any lymphadenopathy. His performance status and activity level remain relatively stable at this time. Remainder of his review of system is unremarkable.  Medications: I have reviewed the patient's current medications.  Current Outpatient Prescriptions  Medication Sig Dispense Refill  . ALPRAZolam (XANAX) 1 MG tablet Take 1 tablet (1 mg total) by mouth 3 (three) times daily as needed for anxiety. 100 tablet 0  . calcium carbonate (TUMS - DOSED IN MG ELEMENTAL CALCIUM) 500 MG chewable tablet Chew 1 tablet by mouth as needed for indigestion or heartburn.    . DULoxetine (CYMBALTA) 60 MG capsule Take 1 capsule (60 mg total) by mouth 2 (two) times daily. 60 capsule 2  .  gabapentin (NEURONTIN) 300 MG capsule Take 1 capsule (300 mg total) by mouth 3 (three) times daily. 90 capsule 3  . HYDROmorphone (DILAUDID) 8 MG tablet Take 1 tablet (8 mg total) by mouth every 4 (four) hours as needed for moderate pain or severe pain. 50 tablet 0  . ibuprofen (ADVIL,MOTRIN) 200 MG tablet Take 400 mg by mouth every 6 (six) hours as needed for fever, headache, moderate pain or cramping.    [name] tiZANidine (ZANAFLEX) 4 MG  tablet Take 4 mg by mouth every 8 (eight) hours as needed for muscle spasms.   0  . VIAGRA 100 MG tablet Take 50 mg by mouth as needed for erectile dysfunction.     [name] Cabozantinib S-Malate (CABOMETYX) 60 MG TABS Take 60 mg by mouth daily. (Patient not taking: Reported on 09/20/2015) 60 tablet 0  . megestrol (MEGACE) 400 MG/10ML suspension Take 10 mLs (400 mg total) by mouth 2 (two) times daily. 240 mL 1   No current facility-administered medications for this visit.     Allergies:  Allergies  Allergen Reactions  . Ceftriaxone Hives  . Hydrocodone Swelling    Past Medical History, Surgical history, Social history, and Family History were reviewed and updated.   Physical Exam: Blood pressure 118/83, pulse 96, temperature 98.4 F (36.9 C), temperature source Oral, resp. rate 18, height '5\' 11"'$  (1.803 m), weight 190 lb 8 oz (86.41 kg), SpO2 100 %. ECOG: 1 General appearance: alert and awake gentleman . Without any physical distress. He is able to ambulate without any difficulties.  Head: Normocephalic, without obvious abnormality.  Neck: no adenopathy, no masses.  Lymph nodes: Cervical, supraclavicular, and axillary nodes normal. Heart:regular rate and rhythm, S1, S2.  Lung:chest clear, no wheezing, rales, normal symmetric air entry. No dullness to percussion.  Abdomen: soft, non-tender, without masses or organomegaly no shifting dullness or ascites.  EXT:. Very tender to touch. Slight protrusion noted on the left leg on the lateral aspect. Slight edema noted. Neuro: no focal deficits noted. No weakness noted in his lower extremities.  Lab Results: Lab Results  Component Value Date   WBC 7.3 10/05/2015   HGB 11.7* 10/05/2015   HCT 35.2* 10/05/2015   MCV 95.4 10/05/2015   PLT 203 10/05/2015     Impression and Plan:  This is a pleasant 49 year old gentleman with the following issues. 1. Metastatic renal cell carcinoma.  He has documented disease to the lung and the pancreas.  CT  scan from 06/20/2015 showed progression of disease. He is S/P Nivolumab 3 mg/kg given every 2 weeks started on 06/29/2015. After 4 cycles of therapy CT scans obtained on 09/12/2015 showed increase in his pancreatic mass and his sacral mass. the results of the CT scan was reviewed again with the patient as well as discussing the 10 mm nodule in his liver. I explained to him that he has stage IV disease and regardless of the site of metastasis, that treatment is not any different. Although liver metastasis might carry worse prognosis, the overall progression of his cancer is more worrisome overall. Despite that, he would like to continue to be aggressive and desire anticancer treatment. He is willing to try Cabometyx and the appropriate forms were signed today.. 2. Sacral metastasis: Recent CT scan have shown progression of disease in that area without pain. his pain have not changed in that particular area.  3. Depression/Anxiety: He remains on Xanax and Cymbalta. His mood is is worse at this time despite these medications. I have  offered him psychosocial support services and he declined. He denied homicidal and suicidal ideation and declined to talk to a therapist. I have encouraged him to continue to take his current medication for the time being.  4. Weight loss: improved at this time with a reasonable appetite.  5. Neuropathic pain. He is on gabapentin and Dilaudid for pain. He also has a subcutaneous pain medication pump. His pain is under reasonable control except for his left leg . 6. Left lower extremity pain and a mass: MRI of the tibia was reviewed and showed a destructive lesion likely arising from the bony structure. his case was discussed with Dr. Tammi Klippel, and he felt that radiation therapy can offer him palliation of pain in that area. I discussed with him that given the destructive nature of his tumor and possible radiation therapy afterwards, he might be a risk of fractures in that area. He is  willing to consider radiation therapy for the time being.. 7. Hypertension: Controlled at this time. This will need to be monitored moving followed. 8. Prognosis: I continue to emphasize the fact that he has an incurable malignancy and he has had stage IV disease since 2009. I do believe that his cancer status have worsened dramatically but he would like to continue to be aggressive at this time. He understands there is no curative option at this time and all treatment is palliative in nature.  9. Hyperglycemia: His blood sugar today is 248. Discharge and follow-up his prior care physician regarding potential diabetes.  10. Follow up: in 4 weeks to follow-up on his clinical progress and determine any complications of therapy.  [facility], MD 10/05/2015

## 2015-10-05 NOTE — Telephone Encounter (Signed)
Gave adn printd appt sched and avs for pt for NOV

## 2015-10-05 NOTE — Progress Notes (Signed)
10/05/15 - Cabometyx approved via [employer]. Prior Auth [id number]. Date 09/28/15-09/27/16. Rx requires specialty pharmacy. Rx faxed to [facility].   Pt will sign forms for [employer] access for 14 day free trial today when he is here for Dr. [name] visit.  Thank you, Montel Clock PharmD, [facility]

## 2015-10-08 ENCOUNTER — Encounter: Payer: Self-pay | Admitting: Oncology

## 2015-10-08 ENCOUNTER — Other Ambulatory Visit: Payer: Self-pay | Admitting: Oncology

## 2015-10-08 NOTE — Progress Notes (Signed)
Recd fax from [employer] on asst with cabometyx. They needed patient name/dob on page 2. I faxed back [phone]

## 2015-10-08 NOTE — Progress Notes (Signed)
Recd fax from [employer], the patient has bee approved for 15day supply of cabometyx. I will forward to [employer]

## 2015-10-09 ENCOUNTER — Encounter: Payer: Self-pay | Admitting: Oncology

## 2015-10-09 ENCOUNTER — Telehealth: Payer: Self-pay | Admitting: Pharmacist

## 2015-10-09 ENCOUNTER — Encounter: Payer: Self-pay | Admitting: *Deleted

## 2015-10-09 NOTE — Progress Notes (Signed)
Per [employer] cabometyx approved and [facility] will call the patient to set up delivery. I will send copy to medical records.

## 2015-10-09 NOTE — Telephone Encounter (Signed)
[facility] cannot dispense cabometyx. Rx sent to [facility] on 10/09/15. Fax: [phone]

## 2015-10-11 ENCOUNTER — Ambulatory Visit: Payer: 59 | Admitting: Radiation Oncology

## 2015-10-16 ENCOUNTER — Telehealth: Payer: Self-pay | Admitting: Radiation Oncology

## 2015-10-16 NOTE — Telephone Encounter (Signed)
Patient left message for this RN wanting to reschedule [facility] appointment. Will consult with Dr. Tammi Klippel and return patient's call.

## 2015-10-16 NOTE — Telephone Encounter (Signed)
Yes, please reschedule sim.

## 2015-10-18 ENCOUNTER — Telehealth: Payer: Self-pay | Admitting: Pharmacist

## 2015-10-18 NOTE — Telephone Encounter (Signed)
12/27: attempted follow up call for oral chemotherapy management. Pt has been approved through the Cabometyx EASE program for assistance. He was mailed cabometyx but I have been unable to reach patient to verify that he has started or if he has any issues or questions. Left voicemail today for patient to call back ([phone])  Thank you, Montel Clock, PharmD, [facility]

## 2015-10-23 ENCOUNTER — Other Ambulatory Visit: Payer: Self-pay | Admitting: *Deleted

## 2015-10-23 ENCOUNTER — Telehealth: Payer: Self-pay | Admitting: *Deleted

## 2015-10-23 DIAGNOSIS — C7951 Secondary malignant neoplasm of bone: Secondary | ICD-10-CM

## 2015-10-23 DIAGNOSIS — K8689 Other specified diseases of pancreas: Secondary | ICD-10-CM

## 2015-10-23 DIAGNOSIS — K611 Rectal abscess: Secondary | ICD-10-CM

## 2015-10-23 DIAGNOSIS — F419 Anxiety disorder, unspecified: Secondary | ICD-10-CM

## 2015-10-23 DIAGNOSIS — C649 Malignant neoplasm of unspecified kidney, except renal pelvis: Secondary | ICD-10-CM

## 2015-10-23 MED ORDER — ALPRAZOLAM 1 MG PO TABS: 1.0000 mg | ORAL_TABLET | Freq: Three times a day (TID) | ORAL | Status: DC | PRN

## 2015-10-23 MED ORDER — GABAPENTIN 300 MG PO CAPS: 300.0000 mg | ORAL_CAPSULE | Freq: Three times a day (TID) | ORAL | Status: DC

## 2015-10-23 MED ORDER — HYDROMORPHONE HCL 8 MG PO TABS: 8.0000 mg | ORAL_TABLET | ORAL | Status: DC | PRN

## 2015-10-23 NOTE — Telephone Encounter (Signed)
Patient calls.  He has not heard anything about his radiation appointments and is calling Dr. [name] to find out what it going on.  Per Dr. Tammi Klippel phone note 10/16/15 he was to be reschedule for [facility].   Dr. Tammi Klippel and Clista Bernhardt RN are both off today.  Spoke with Zalma in [facility].  She will need to speak with Dr. Tammi Klippel to see when he wants patient rescheduled.  She will call patient today and let him know they are working on this. Patient said he also needs refills of his pain pills, gabapentin and xanax.  Patient call back is [phone].  Will Let [facility] RN @ Dr. [name] know.

## 2015-10-25 ENCOUNTER — Telehealth: Payer: Self-pay | Admitting: Radiation Oncology

## 2015-10-25 NOTE — Telephone Encounter (Signed)
Patient's wife left message requesting this RN call her. Phoned [phone] as requested. No answer. Left message requesting return call. Keith [name], patient's wife, called back immediately. She inquired if Dr. Tammi Klippel plans to treat her husband with radiation. Gently explained that three appointments have been arranged to simulate her husband but, he has no showed all of them. Also, explained our sim staff have phoned the patient numerous times in an attempt to reschedule without any success. Explained we are more than happy to reschedule her husband for simulation and radiation treatment to follow. She verbalized she plans to speak with her husband and "see what she can do."

## 2015-10-26 ENCOUNTER — Telehealth: Payer: Self-pay | Admitting: Pharmacist

## 2015-10-26 NOTE — Telephone Encounter (Signed)
11/4 - called patient to follow up on Cabometyx oral chemotherapy medication. Pt is doing well. He does have bone pain from his disease. He has missed his last several scheduled appointments for simulation with [facility]. His wife will try to make sure he is able to attend these appointments so Mr. Savannah can receive treatment.   Pt is currently on free - 15 day trial of Cabometyx. This will be completed on Monday 11/7. Pt has new insurance and will bring these cards in with him on his next visit (they have been provided to specialty pharmacy [facility]). Copay is $10. Mr. Bribiesca received shipment from [place] today for his month supply. He can start this on Tuesday 11/8 when his trial supply is finished.   No other questions or issues at this time. Will follow up with patient in 1-2 weeks  Thank you,  Montel Clock, PharmD, [facility]

## 2015-10-31 ENCOUNTER — Telehealth: Payer: Self-pay | Admitting: Radiation Oncology

## 2015-10-31 NOTE — Telephone Encounter (Signed)
Wife called this RN back. She wants to schedule patient for [facility] and plans to bring him to the appointment herself. She verbalizes that Malvin continues to complain of pain. Transferred Penny to [place], RT in [facility] to reschedule.

## 2015-10-31 NOTE — Telephone Encounter (Signed)
Concerned about both patient and wife following last conversation. Reaching out today to access for needs or desire to reschedule SIM. No answer at any number called. Left message on home phone and wife's cell requesting return call.

## 2015-11-06 ENCOUNTER — Telehealth: Payer: Self-pay | Admitting: Radiation Oncology

## 2015-11-06 NOTE — Telephone Encounter (Signed)
Keith [name], patient's wife, left message requesting return call. Phoned number provided. No answer. Left message.

## 2015-11-07 ENCOUNTER — Inpatient Hospital Stay (HOSPITAL_COMMUNITY): Payer: Medicare Other

## 2015-11-07 ENCOUNTER — Emergency Department (HOSPITAL_COMMUNITY): Payer: Medicare Other

## 2015-11-07 ENCOUNTER — Inpatient Hospital Stay (HOSPITAL_COMMUNITY)
Admission: EM | Admit: 2015-11-07 | Discharge: 2015-11-09 | DRG: 176 | Disposition: A | Discharge status: Home Health | Payer: Medicare Other | Attending: Internal Medicine | Admitting: Internal Medicine

## 2015-11-07 ENCOUNTER — Encounter (HOSPITAL_COMMUNITY): Payer: Self-pay | Admitting: Family Medicine

## 2015-11-07 DIAGNOSIS — I2699 Other pulmonary embolism without acute cor pulmonale: Secondary | ICD-10-CM

## 2015-11-07 DIAGNOSIS — R109 Unspecified abdominal pain: Secondary | ICD-10-CM

## 2015-11-07 DIAGNOSIS — R0781 Pleurodynia: Secondary | ICD-10-CM | POA: Diagnosis present

## 2015-11-07 DIAGNOSIS — C7800 Secondary malignant neoplasm of unspecified lung: Secondary | ICD-10-CM | POA: Diagnosis present

## 2015-11-07 DIAGNOSIS — Z791 Long term (current) use of non-steroidal anti-inflammatories (NSAID): Secondary | ICD-10-CM

## 2015-11-07 DIAGNOSIS — K5903 Drug induced constipation: Secondary | ICD-10-CM | POA: Diagnosis present

## 2015-11-07 DIAGNOSIS — T40605A Adverse effect of unspecified narcotics, initial encounter: Secondary | ICD-10-CM | POA: Diagnosis present

## 2015-11-07 DIAGNOSIS — Z79899 Other long term (current) drug therapy: Secondary | ICD-10-CM

## 2015-11-07 DIAGNOSIS — I10 Essential (primary) hypertension: Secondary | ICD-10-CM

## 2015-11-07 DIAGNOSIS — C649 Malignant neoplasm of unspecified kidney, except renal pelvis: Secondary | ICD-10-CM | POA: Diagnosis not present

## 2015-11-07 DIAGNOSIS — Z885 Allergy status to narcotic agent status: Secondary | ICD-10-CM | POA: Diagnosis not present

## 2015-11-07 DIAGNOSIS — F419 Anxiety disorder, unspecified: Secondary | ICD-10-CM | POA: Diagnosis not present

## 2015-11-07 DIAGNOSIS — Z833 Family history of diabetes mellitus: Secondary | ICD-10-CM | POA: Diagnosis not present

## 2015-11-07 DIAGNOSIS — F17210 Nicotine dependence, cigarettes, uncomplicated: Secondary | ICD-10-CM | POA: Diagnosis not present

## 2015-11-07 DIAGNOSIS — Z881 Allergy status to other antibiotic agents status: Secondary | ICD-10-CM

## 2015-11-07 DIAGNOSIS — E119 Type 2 diabetes mellitus without complications: Secondary | ICD-10-CM | POA: Diagnosis not present

## 2015-11-07 DIAGNOSIS — Z923 Personal history of irradiation: Secondary | ICD-10-CM | POA: Diagnosis not present

## 2015-11-07 DIAGNOSIS — C642 Malignant neoplasm of left kidney, except renal pelvis: Secondary | ICD-10-CM | POA: Diagnosis not present

## 2015-11-07 DIAGNOSIS — C7951 Secondary malignant neoplasm of bone: Secondary | ICD-10-CM | POA: Diagnosis not present

## 2015-11-07 DIAGNOSIS — K1230 Oral mucositis (ulcerative), unspecified: Secondary | ICD-10-CM | POA: Diagnosis present

## 2015-11-07 DIAGNOSIS — R0602 Shortness of breath: Secondary | ICD-10-CM | POA: Diagnosis not present

## 2015-11-07 DIAGNOSIS — C7889 Secondary malignant neoplasm of other digestive organs: Secondary | ICD-10-CM | POA: Diagnosis not present

## 2015-11-07 DIAGNOSIS — C7801 Secondary malignant neoplasm of right lung: Secondary | ICD-10-CM | POA: Diagnosis not present

## 2015-11-07 DIAGNOSIS — G8929 Other chronic pain: Secondary | ICD-10-CM | POA: Diagnosis not present

## 2015-11-07 DIAGNOSIS — R0600 Dyspnea, unspecified: Secondary | ICD-10-CM | POA: Diagnosis present

## 2015-11-07 DIAGNOSIS — R112 Nausea with vomiting, unspecified: Secondary | ICD-10-CM | POA: Diagnosis not present

## 2015-11-07 DIAGNOSIS — Z79891 Long term (current) use of opiate analgesic: Secondary | ICD-10-CM

## 2015-11-07 DIAGNOSIS — Z8249 Family history of ischemic heart disease and other diseases of the circulatory system: Secondary | ICD-10-CM | POA: Diagnosis not present

## 2015-11-07 DIAGNOSIS — K5900 Constipation, unspecified: Secondary | ICD-10-CM | POA: Diagnosis not present

## 2015-11-07 LAB — CBC WITH DIFFERENTIAL/PLATELET
Basophils Absolute: 0 10*3/uL (ref 0.0–0.1)
Basophils Relative: 1 %
Eosinophils Absolute: 0.2 10*3/uL (ref 0.0–0.7)
Eosinophils Relative: 2 %
HCT: 43.1 % (ref 39.0–52.0)
Hemoglobin: 14.5 g/dL (ref 13.0–17.0)
Lymphocytes Relative: 21 %
Lymphs Abs: 1.8 10*3/uL (ref 0.7–4.0)
MCH: 31.3 pg (ref 26.0–34.0)
MCHC: 33.6 g/dL (ref 30.0–36.0)
MCV: 92.9 fL (ref 78.0–100.0)
Monocytes Absolute: 0.5 10*3/uL (ref 0.1–1.0)
Monocytes Relative: 6 %
Neutro Abs: 5.8 10*3/uL (ref 1.7–7.7)
Neutrophils Relative %: 70 %
Platelets: 109 10*3/uL — ABNORMAL LOW (ref 150–400)
RBC: 4.64 MIL/uL (ref 4.22–5.81)
RDW: 14.8 % (ref 11.5–15.5)
WBC: 8.4 10*3/uL (ref 4.0–10.5)

## 2015-11-07 LAB — BASIC METABOLIC PANEL
Anion gap: 7 (ref 5–15)
BUN: 9 mg/dL (ref 6–20)
CO2: 29 mmol/L (ref 22–32)
Calcium: 8.8 mg/dL — ABNORMAL LOW (ref 8.9–10.3)
Chloride: 101 mmol/L (ref 101–111)
Creatinine, Ser: 0.9 mg/dL (ref 0.61–1.24)
GFR calc Af Amer: >60 mL/min (ref >60)
GFR calc non Af Amer: >60 mL/min (ref >60)
Glucose, Bld: 131 mg/dL — ABNORMAL HIGH (ref 65–99)
Potassium: 4 mmol/L (ref 3.5–5.1)
Sodium: 137 mmol/L (ref 135–145)

## 2015-11-07 LAB — I-STAT TROPONIN, ED: Troponin i, poc: 0.01 ng/mL (ref 0.00–0.08)

## 2015-11-07 LAB — GLUCOSE, CAPILLARY
Glucose-Capillary: 130 mg/dL — ABNORMAL HIGH (ref 65–99)
Glucose-Capillary: 147 mg/dL — ABNORMAL HIGH (ref 65–99)

## 2015-11-07 MED ORDER — HYDROMORPHONE HCL 1 MG/ML IJ SOLN
1.0000 mg | Freq: Once | INTRAMUSCULAR | Status: AC
  Administered 2015-11-07: 1 mg via INTRAVENOUS

## 2015-11-07 MED ORDER — HYDROMORPHONE HCL 1 MG/ML IJ SOLN
1.0000 mg | Freq: Once | INTRAMUSCULAR | Status: AC
  Administered 2015-11-07: 1 mg via INTRAVENOUS
  Filled 2015-11-07: qty 1

## 2015-11-07 MED ORDER — HYDROMORPHONE HCL 2 MG/ML IJ SOLN
2.0000 mg | INTRAMUSCULAR | Status: DC | PRN
  Administered 2015-11-07 – 2015-11-08 (×4): 3 mg via INTRAVENOUS
  Administered 2015-11-08: 2 mg via INTRAVENOUS
  Administered 2015-11-08: 3 mg via INTRAVENOUS
  Administered 2015-11-09: 2 mg via INTRAVENOUS
  Filled 2015-11-07 (×5): qty 2
  Filled 2015-11-07 (×2): qty 1

## 2015-11-07 MED ORDER — ONDANSETRON HCL 4 MG/2ML IJ SOLN
4.0000 mg | Freq: Four times a day (QID) | INTRAMUSCULAR | Status: DC | PRN
  Administered 2015-11-08 (×2): 4 mg via INTRAVENOUS
  Filled 2015-11-07 (×3): qty 2

## 2015-11-07 MED ORDER — ACETAMINOPHEN 650 MG RE SUPP: 650.0000 mg | Freq: Four times a day (QID) | RECTAL | Status: DC | PRN

## 2015-11-07 MED ORDER — INSULIN ASPART 100 UNIT/ML ~~LOC~~ SOLN
0.0000 [IU] | Freq: Three times a day (TID) | SUBCUTANEOUS | Status: DC
Start: 2015-11-07 — End: 2015-11-09
  Administered 2015-11-07 – 2015-11-09 (×6): 1 [IU] via SUBCUTANEOUS

## 2015-11-07 MED ORDER — ALPRAZOLAM 1 MG PO TABS
1.0000 mg | ORAL_TABLET | Freq: Three times a day (TID) | ORAL | Status: DC | PRN
  Administered 2015-11-07: 1 mg via ORAL
  Filled 2015-11-07: qty 1

## 2015-11-07 MED ORDER — ENOXAPARIN SODIUM 100 MG/ML ~~LOC~~ SOLN
90.0000 mg | Freq: Two times a day (BID) | SUBCUTANEOUS | Status: DC
  Administered 2015-11-07 – 2015-11-09 (×4): 90 mg via SUBCUTANEOUS
  Filled 2015-11-07 (×5): qty 1

## 2015-11-07 MED ORDER — ONDANSETRON HCL 4 MG/2ML IJ SOLN: 4.0000 mg | Freq: Four times a day (QID) | INTRAMUSCULAR | Status: DC | PRN

## 2015-11-07 MED ORDER — DIPHENHYDRAMINE HCL 25 MG PO CAPS: 25.0000 mg | ORAL_CAPSULE | Freq: Four times a day (QID) | ORAL | Status: DC | PRN

## 2015-11-07 MED ORDER — CALCIUM CARBONATE ANTACID 500 MG PO CHEW: 1.0000 | CHEWABLE_TABLET | ORAL | Status: DC | PRN

## 2015-11-07 MED ORDER — ENOXAPARIN SODIUM 100 MG/ML ~~LOC~~ SOLN
90.0000 mg | Freq: Once | SUBCUTANEOUS | Status: AC
  Administered 2015-11-07: 90 mg via SUBCUTANEOUS
  Filled 2015-11-07 (×2): qty 1

## 2015-11-07 MED ORDER — HYDROMORPHONE HCL 1 MG/ML IJ SOLN
1.0000 mg | INTRAMUSCULAR | Status: DC | PRN
  Administered 2015-11-07 (×2): 2 mg via INTRAVENOUS
  Filled 2015-11-07 (×2): qty 2

## 2015-11-07 MED ORDER — ENOXAPARIN (LOVENOX) PATIENT EDUCATION KIT
PACK | Freq: Once | Status: AC
  Administered 2015-11-07: 16:00:00
  Filled 2015-11-07: qty 1

## 2015-11-07 MED ORDER — INSULIN GLARGINE 100 UNIT/ML ~~LOC~~ SOLN
26.0000 [IU] | Freq: Every day | SUBCUTANEOUS | Status: DC
  Administered 2015-11-07 – 2015-11-08 (×2): 26 [IU] via SUBCUTANEOUS
  Filled 2015-11-07 (×3): qty 0.26

## 2015-11-07 MED ORDER — DULOXETINE HCL 60 MG PO CPEP
60.0000 mg | ORAL_CAPSULE | Freq: Two times a day (BID) | ORAL | Status: DC
  Administered 2015-11-07 – 2015-11-09 (×4): 60 mg via ORAL
  Filled 2015-11-07 (×4): qty 1

## 2015-11-07 MED ORDER — IOHEXOL 350 MG/ML SOLN
100.0000 mL | Freq: Once | INTRAVENOUS | Status: AC | PRN
  Administered 2015-11-07: 100 mL via INTRAVENOUS

## 2015-11-07 MED ORDER — GABAPENTIN 300 MG PO CAPS
300.0000 mg | ORAL_CAPSULE | Freq: Three times a day (TID) | ORAL | Status: DC
  Administered 2015-11-07 – 2015-11-09 (×6): 300 mg via ORAL
  Filled 2015-11-07 (×6): qty 1

## 2015-11-07 MED ORDER — ACETAMINOPHEN 325 MG PO TABS: 650.0000 mg | ORAL_TABLET | Freq: Four times a day (QID) | ORAL | Status: DC | PRN

## 2015-11-07 MED ORDER — ONDANSETRON HCL 4 MG PO TABS: 4.0000 mg | ORAL_TABLET | Freq: Four times a day (QID) | ORAL | Status: DC | PRN

## 2015-11-07 MED ORDER — MEGESTROL ACETATE 400 MG/10ML PO SUSP
400.0000 mg | Freq: Two times a day (BID) | ORAL | Status: DC
Start: 2015-11-07 — End: 2015-11-09
  Administered 2015-11-07: 400 mg via ORAL
  Filled 2015-11-07 (×4): qty 10
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
## 2015-11-07 NOTE — ED Notes (Signed)
Patient transported to X-ray 
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
## 2015-11-07 NOTE — Progress Notes (Signed)
*  PRELIMINARY RESULTS* Echocardiogram 2D Echocardiogram has been performed.  Leavy Cella 11/07/2015, 2:28 PM

## 2015-11-07 NOTE — ED Notes (Signed)
Pt is complaining of shortness of breath and left sided pain. Pt is a lung cancer patient. Currently taking chemo oral medication daily. Symptoms started yesterday at noon all of a sudden.

## 2015-11-08 ENCOUNTER — Inpatient Hospital Stay (HOSPITAL_COMMUNITY): Payer: Medicare Other

## 2015-11-08 LAB — COMPREHENSIVE METABOLIC PANEL
ALT: 77 U/L — ABNORMAL HIGH (ref 17–63)
AST: 50 U/L — ABNORMAL HIGH (ref 15–41)
Albumin: 3.7 g/dL (ref 3.5–5.0)
Alkaline Phosphatase: 134 U/L — ABNORMAL HIGH (ref 38–126)
Anion gap: 11 (ref 5–15)
BUN: 12 mg/dL (ref 6–20)
CO2: 26 mmol/L (ref 22–32)
Calcium: 8.8 mg/dL — ABNORMAL LOW (ref 8.9–10.3)
Chloride: 99 mmol/L — ABNORMAL LOW (ref 101–111)
Creatinine, Ser: 1.08 mg/dL (ref 0.61–1.24)
GFR calc Af Amer: >60 mL/min (ref >60)
GFR calc non Af Amer: >60 mL/min (ref >60)
Glucose, Bld: 125 mg/dL — ABNORMAL HIGH (ref 65–99)
Potassium: 4 mmol/L (ref 3.5–5.1)
Sodium: 136 mmol/L (ref 135–145)
Total Bilirubin: 0.5 mg/dL (ref 0.3–1.2)
Total Protein: 8.3 g/dL — ABNORMAL HIGH (ref 6.5–8.1)

## 2015-11-08 LAB — GLUCOSE, CAPILLARY
Glucose-Capillary: 126 mg/dL — ABNORMAL HIGH (ref 65–99)
Glucose-Capillary: 127 mg/dL — ABNORMAL HIGH (ref 65–99)
Glucose-Capillary: 149 mg/dL — ABNORMAL HIGH (ref 65–99)
Glucose-Capillary: 134 mg/dL — ABNORMAL HIGH (ref 65–99)

## 2015-11-08 LAB — CBC
HCT: 48 % (ref 39.0–52.0)
Hemoglobin: 15.7 g/dL (ref 13.0–17.0)
MCH: 31 pg (ref 26.0–34.0)
MCHC: 32.7 g/dL (ref 30.0–36.0)
MCV: 94.7 fL (ref 78.0–100.0)
Platelets: 106 10*3/uL — ABNORMAL LOW (ref 150–400)
RBC: 5.07 MIL/uL (ref 4.22–5.81)
RDW: 15.3 % (ref 11.5–15.5)
WBC: 8 10*3/uL (ref 4.0–10.5)

## 2015-11-08 MED ORDER — PROMETHAZINE HCL 25 MG/ML IJ SOLN
25.0000 mg | Freq: Four times a day (QID) | INTRAMUSCULAR | Status: DC | PRN
  Administered 2015-11-08: 25 mg via INTRAVENOUS
  Filled 2015-11-08: qty 1

## 2015-11-08 MED ORDER — SODIUM CHLORIDE 0.9 % IV SOLN
INTRAVENOUS | Status: DC
  Administered 2015-11-08 – 2015-11-09 (×3): via INTRAVENOUS

## 2015-11-08 MED ORDER — METOPROLOL TARTRATE 25 MG PO TABS
25.0000 mg | ORAL_TABLET | Freq: Two times a day (BID) | ORAL | Status: DC
  Administered 2015-11-08 – 2015-11-09 (×3): 25 mg via ORAL
  Filled 2015-11-08 (×3): qty 1

## 2015-11-08 MED ORDER — ENSURE ENLIVE PO LIQD
237.0000 mL | Freq: Two times a day (BID) | ORAL | Status: DC
  Administered 2015-11-08 – 2015-11-09 (×2): 237 mL via ORAL

## 2015-11-08 MED ORDER — SENNOSIDES-DOCUSATE SODIUM 8.6-50 MG PO TABS
1.0000 | ORAL_TABLET | Freq: Two times a day (BID) | ORAL | Status: DC
Start: 2015-11-08 — End: 2015-11-09
  Administered 2015-11-08 – 2015-11-09 (×3): 1 via ORAL
  Filled 2015-11-08 (×3): qty 1

## 2015-11-08 MED ORDER — LACTULOSE 10 GM/15ML PO SOLN
20.0000 g | Freq: Three times a day (TID) | ORAL | Status: DC
Start: 2015-11-08 — End: 2015-11-09
  Administered 2015-11-08 (×2): 20 g via ORAL
  Filled 2015-11-08 (×2): qty 30

## 2015-11-08 NOTE — Progress Notes (Signed)
Patient last voided yesterday, bladder scan done >530m. Offered urinal, turned on faucet.Patient able to void 6017m

## 2015-11-08 NOTE — Progress Notes (Signed)
Initial Nutrition Assessment  DOCUMENTATION CODES:   Not applicable  INTERVENTION:  - Will order Ensure Enlive BID, each supplement provides 350 kcal and 20 grams of protein - RD will continue to monitor for needs  NUTRITION DIAGNOSIS:   Increased nutrient needs related to catabolic illness, cancer and cancer related treatments as evidenced by estimated needs.  GOAL:   Patient will meet greater than or equal to 90% of their needs  MONITOR:   PO intake, Supplement acceptance, Weight trends, Labs, I & O's  REASON FOR ASSESSMENT:   Malnutrition Screening Tool  ASSESSMENT:   49 y.o. 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 He is followed by Dr.Shadad and is currently on oral salvage chemotherapy with Cabometyx.  Pt seen for MST. BMI indicates normal weight status. No intakes documented. Pt states he did not eat breakfast this AM due to not feeling up to eating but that he was able to eat dinner last night.   Pt stated that he was feeling drowsy and weak; RD offered to visit another day and pt very appreciative of this. Renal cancer is stage 4, per notes.  Unable to do physical assessment at this time. Per chart review, pt has lost 6 lbs (3% body weight) in the past 4 months which is not significant for time frame.  Unsure if pt was meeting needs PTA. Medications reviewed. Labs reviewed; CBGs: 126-147 mg/dL, Cl: 99 mmol/L, Ca: 8.8 mg/dL, LFTs elevated.    Diet Order:  Diet clear liquid Room service appropriate?: Yes; Fluid consistency:: Thin  Skin:  Reviewed, no issues  Last BM:  11/13  Height:   Ht Readings from Last 1 Encounters:  11/07/15 '6\' 3"'$  (1.905 m)    Weight:   Wt Readings from Last 1 Encounters:  11/07/15 192 lb (87.091 kg)    Ideal Body Weight:  89.09 kg (kg)  BMI:  Body mass index is 24 kg/(m^2).  Estimated Nutritional Needs:   Kcal:  2600-2800  Protein:  105-115 grams  Fluid:  2.2-2.5 L/day  EDUCATION NEEDS:    No education needs identified at this time     Jarome Matin, RD, LDN Inpatient Clinical Dietitian Pager # [phone] After hours/weekend pager # [phone]

## 2015-11-08 NOTE — Progress Notes (Signed)
[facility] PROGRESS NOTE  Keith [name] ZOX:[id number] DOB: Nov 25, 1966 DOA: 11/07/2015 PCP: [name], MD  Assessment/Plan:  Acute Pulmonary embolus -in setting of malignancy -hemodynamically stable -Continue full dose Lovenox -2-D echocardiogram: with normal EF and wall motion -Lovenox teaching  Nausea and Vomiting -likely due to constipation, exam unremarkable -check KUB, clears, supportive care -Lactulose, senokot  Stage 4 Renal Cell carcinoma -On salvage oral chemotherapy at this time Cabozantinib -Hold at this time, will notify Dr.Shadad tomorrow  DM -continue Lantus, SSI  Chronic pain/Bone pain  -takes oral dilaudid for this at home  Code Status: Full Code DVT Prophylaxis: Lovenox Family Communication: none at bedside Disposition Plan: Inpatient  HPI/Subjective:  Vomited multiple times  Objective: Filed Vitals:   11/08/15 1238  BP: 134/101  Pulse:   Temp:   Resp:     Intake/Output Summary (Last 24 hours) at 11/08/15 1356 Last data filed at 11/08/15 1152  Gross per 24 hour  Intake      0 ml  Output   1200 ml  Net  -1200 ml   Filed Weights   11/07/15 0541  Weight: 87.091 kg (192 lb)    Exam:   General:  AAOx3  Cardiovascular: S1S2/RRR  Respiratory: CTAB  Abdomen: soft, slightly distended, NT, BS present  Musculoskeletal: no edema    Data Reviewed: Basic Metabolic Panel:  Recent Labs Lab 11/07/15 0653 11/08/15 0509  NA 137 136  K 4.0 4.0  CL 101 99*  CO2 29 26  GLUCOSE 131* 125*  BUN 9 12  CREATININE 0.90 1.08  CALCIUM 8.8* 8.8*   Liver Function Tests:  Recent Labs Lab 11/08/15 0509  AST 50*  ALT 77*  ALKPHOS 134*  BILITOT 0.5  PROT 8.3*  ALBUMIN 3.7   No results for input(s): LIPASE, AMYLASE in the last 168 hours. No results for input(s): AMMONIA in the last 168 hours. CBC:  Recent Labs Lab 11/07/15 0653 11/08/15 0509  WBC 8.4 8.0  NEUTROABS 5.8  --   HGB 14.5 15.7  HCT 43.1 48.0   MCV 92.9 94.7  PLT 109* 106*   Cardiac Enzymes: No results for input(s): CKTOTAL, CKMB, CKMBINDEX, TROPONINI in the last 168 hours. BNP (last 3 results) No results for input(s): BNP in the last 8760 hours.  ProBNP (last 3 results) No results for input(s): PROBNP in the last 8760 hours.  CBG:  Recent Labs Lab 11/07/15 1710 11/07/15 2135 11/08/15 0727 11/08/15 1202  GLUCAP 130* 147* 126* 127*    No results found for this or any previous visit (from the past 240 hour(s)).   Studies: Dg Chest 2 View  11/07/2015  CLINICAL DATA:  New onset left upper abdominal and lower chest pain with shortness of breath. Symptoms worsening since yesterday. History of metastatic renal cell carcinoma with metastasis to lung, currently on chemotherapy. EXAM: CHEST  2 VIEW COMPARISON:  Chest CT 09/12/2015 06/20/2015 FINDINGS: Emphysematous change at the lung apices. Right suprahilar scarring, unchanged. Right infrahilar prominence, corresponds to hilar soft tissue density on CT. The heart is normal in size. No pulmonary edema, confluent airspace disease, pleural effusion or pneumothorax. No acute osseous abnormalities are seen. IMPRESSION: Emphysema and scarring in the right upper lobe. Right infrahilar prominence corresponding to soft tissue on prior CT. No definite superimposed acute process. Electronically Signed   By: Jeb Levering M.D.   On: 11/07/2015 07:00   Dg Abd 1 View  11/08/2015  CLINICAL DATA:  Abdominal pain with vomiting EXAM: ABDOMEN - 1 VIEW COMPARISON:  CT abdomen 09/12/2015 FINDINGS: Contrast filled urinary bladder from contrast-enhanced CT chest yesterday. Urinary bladder is normal. Electronic pain pump noted on the left with catheter extending into the spinal canal. Normal bowel gas pattern. Moderate constipation. Mild degenerative change in both hip joints. No acute bony abnormality. Negative for urinary tract calculi IMPRESSION: Constipation Electronically Signed   By: Franchot Gallo M.D.   On: 11/08/2015 10:36   Ct Angio Chest Pe W/cm &/or Wo Cm  11/07/2015  CLINICAL DATA:  Left-sided chest pain and shortness of Breath EXAM: CT ANGIOGRAPHY CHEST WITH CONTRAST TECHNIQUE: Multidetector CT imaging of the chest was performed using the standard protocol during bolus administration of intravenous contrast. Multiplanar CT image reconstructions and MIPs were obtained to evaluate the vascular anatomy. CONTRAST:  176m OMNIPAQUE IOHEXOL 350 MG/ML SOLN COMPARISON:  06/20/2015 FINDINGS: The lungs are well aerated bilaterally. Some emphysematous changes are noted. Additionally there are changes consistent with prior surgery on the right with radiation change. This is most noted in the upper lobe. Persistent central soft tissue density is noted in the right middle lobe measuring approximately 2.1 cm in greatest dimension. This is roughly stable from the prior exam. No new focal mass lesion is seen. Soft tissue density consistent with lymphadenopathy is noted along the left lower lobe pulmonary arterial branch stable from the previous exam. The thoracic inlet is within normal limits. The thoracic aorta shows mild aneurysmal dilatation of the ascending aorta measuring 4.2 cm at the level of the main pulmonary artery. There are multiple filling defects noted within the pulmonary arterial branches involving the lingula and left lower lobe. The pulmonary arterial branches on the right are within normal limits without evidence of pulmonary emboli. No evidence of right heart strain is noted. Scanning into the upper abdomen reveals no acute abnormality. The osseous structures are within normal limits. Review of the MIP images confirms the above findings. IMPRESSION: Changes consistent with left sided pulmonary emboli as described. No evidence of right heart strain is noted. Stable scarring in the right upper lobe when compare with the prior exam. There continues to be some soft tissue density in the right  middle lobe stable from the prior study. Additionally left hilar adenopathy is noted stable from the previous exam. 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 Recommend annual imaging followup by CTA or MRA. This recommendation follows 2010 ACCF/AHA/AATS/ACR/ASA/SCA/SCAI/SIR/STS/SVM Guidelines for the Diagnosis and Management of Patients with Thoracic Aortic Disease. Circulation. 2010; 121:: [id number] results were called by telephone at the time of interpretation on 11/07/2015 at 8:35 am to [facility] [place], who verbally acknowledged these results. Electronically Signed   By: MInez CatalinaM.D.   On: 11/07/2015 08:35    Scheduled Meds: . DULoxetine  60 mg Oral BID  . enoxaparin (LOVENOX) injection  90 mg Subcutaneous Q12H  . feeding supplement (ENSURE ENLIVE)  237 mL Oral BID BM  . gabapentin  300 mg Oral TID  . insulin aspart  0-9 Units Subcutaneous TID WC  . insulin glargine  26 Units Subcutaneous QHS  . lactulose  20 g Oral TID  . megestrol  400 mg Oral BID  . metoprolol tartrate  25 mg Oral BID  . senna-docusate  1 tablet Oral BID   Continuous Infusions: . sodium chloride 75 mL/hr at 11/08/15 1025   Antibiotics Given (last 72 hours)    None      Principal Problem:   [place] Active Problems:  Pulmonary embolus, left ([place])   Pulmonary embolism ([place])    Time spent: 69mn    Donathan Buller  [facility] Pager [phone] If 7PM-7AM, please contact night-coverage at [email or web address], password [email or web address]11/17/2016, 1:56 PM  LOS: 1 day

## 2015-11-09 ENCOUNTER — Ambulatory Visit
Admission: RE | Admit: 2015-11-09 | Discharge: 2015-11-09 | Disposition: A | Discharge status: Home / Self Care | Payer: 59 | Source: Ambulatory Visit | Attending: Radiation Oncology | Admitting: Radiation Oncology

## 2015-11-09 ENCOUNTER — Ambulatory Visit: Payer: Medicare Other

## 2015-11-09 ENCOUNTER — Ambulatory Visit: Payer: 59 | Admitting: Radiation Oncology

## 2015-11-09 ENCOUNTER — Telehealth: Payer: Self-pay | Admitting: *Deleted

## 2015-11-09 ENCOUNTER — Ambulatory Visit: Payer: 59 | Admitting: Oncology

## 2015-11-09 DIAGNOSIS — C7951 Secondary malignant neoplasm of bone: Secondary | ICD-10-CM

## 2015-11-09 DIAGNOSIS — I2699 Other pulmonary embolism without acute cor pulmonale: Principal | ICD-10-CM

## 2015-11-09 DIAGNOSIS — C7989 Secondary malignant neoplasm of other specified sites: Secondary | ICD-10-CM

## 2015-11-09 DIAGNOSIS — C642 Malignant neoplasm of left kidney, except renal pelvis: Secondary | ICD-10-CM

## 2015-11-09 DIAGNOSIS — C7801 Secondary malignant neoplasm of right lung: Secondary | ICD-10-CM

## 2015-11-09 LAB — GLUCOSE, CAPILLARY
Glucose-Capillary: 122 mg/dL — ABNORMAL HIGH (ref 65–99)
Glucose-Capillary: 132 mg/dL — ABNORMAL HIGH (ref 65–99)

## 2015-11-09 MED ORDER — SENNOSIDES-DOCUSATE SODIUM 8.6-50 MG PO TABS: 1.0000 | ORAL_TABLET | Freq: Two times a day (BID) | ORAL | Status: DC

## 2015-11-09 MED ORDER — ENOXAPARIN SODIUM 150 MG/ML ~~LOC~~ SOLN
140.0000 mg | SUBCUTANEOUS | Status: DC
  Filled 2015-11-09: qty 0.93

## 2015-11-09 MED ORDER — LACTULOSE 10 GM/15ML PO SOLN: 20.0000 g | Freq: Two times a day (BID) | ORAL | Status: DC

## 2015-11-09 MED ORDER — METOPROLOL TARTRATE 25 MG PO TABS: 25.0000 mg | ORAL_TABLET | Freq: Two times a day (BID) | ORAL | Status: DC

## 2015-11-09 MED ORDER — ENOXAPARIN SODIUM 40 MG/0.4ML ~~LOC~~ SOLN: 140.0000 mg | SUBCUTANEOUS | Status: DC

## 2015-11-09 MED ORDER — ENOXAPARIN SODIUM 100 MG/ML ~~LOC~~ SOLN: 140.0000 mg | SUBCUTANEOUS | Status: DC

## 2015-11-09 NOTE — Progress Notes (Signed)
      S/W TY @ [facility] # [phone]   LOVENOX 100 MG BID   COVER- YES  CO-PAY- $ 173.69 BID FOR 21 DAY SUPPLY 42 SYRINGES  PRIOR APPROVAL - NO  PHARMACY : [facility], [place] [facility],  [facility]   ENOXAPARIN 100 MG BID   COVER- YES  CO-PAY- $ 44.36  TIER- 5 DRUG  PRIOR APPROVAL - NO  PHARMACY: SAME AS ABOVE

## 2015-11-09 NOTE — Discharge Summary (Signed)
Physician Discharge Summary  Keith [name] PRF:[id number] DOB: April 16, 1966 DOA: 11/07/2015  PCP: Keith Pel, MD  Admit date: 11/07/2015 Discharge date: 11/09/2015  Time spent: 45 minutes  Recommendations for Outpatient Follow-up:  1. Dr.Shadad in 1-2 weeks 2. Needs Continued Engagement with Palliative medicine at the [facility]   Discharge Diagnoses:  Principal Problem:   Stage 4 Renal cell cancer    Bony metastasis   Lung metastasis   Acute Pulmonary embolus, left    Pulmonary embolism    Chronic pain symdrome  Discharge Condition: stable  Diet recommendation: regular  Filed Weights   11/07/15 0541  Weight: 87.091 kg (192 lb)    History of present illness:  Chief Complaint: Chest pain, dyspnea HPI: Keith [name] is a 49 y.o. 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 He is followed by Dr.Shadad and is currently on oral salvage chemotherapy with Cabometyx. Patient reports being in his usual state of health until 11/15, subsequently developed left-sided chest wall/rib pain that was worse with deep inspiration. He also noticed increasing dyspnea on exertion x1 day. He denied any fevers or chills denies any new cough congestion etc.  In the emergency room, He was noted to have left-sided for a emboli without evidence of right heart strain and some stable scarring in the right upper lobe   Hospital Course:  Acute Pulmonary embolus -in setting of malignancy,  -Started on full dose Lovenox -2-D echocardiogram: with normal EF and wall motion -Lovenox teaching completed -clinically improved and weaned off O2, still with some pleuritic chest pain but improved  Constipation -narcotic induced -with nausea and vomiting, resolved, had multiple BMs after Lactulose and senokot -no further N/V, tolerating diet  -discharged home on lactulose and senokot  Stage 4 Renal Cell carcinoma -On salvage oral chemotherapy at this time with  Cabozantinib -resumed at discharge, Dr.Shadad saw him in hospital  DM -continue Lantus, SSI  Chronic pain/Bone pain  -has a morphine SQ pain pump and takes oral dilaudid PRN -continued on same  Consultations:  Onc-Dr.Shadad  Discharge Exam: Filed Vitals:   11/09/15 0521  BP: 130/86  Pulse: 102  Temp: 98 F (36.7 C)  Resp: 26    General: AAOx3 Cardiovascular: S1S2/RRR Respiratory: CTAB  Discharge Instructions   Discharge Instructions    Diet general    Complete by:  As directed      Increase activity slowly    Complete by:  As directed           Current Discharge Medication List    START taking these medications   Details  enoxaparin (LOVENOX) 100 MG/ML injection Inject 1.4 mLs (140 mg total) into the skin daily. Qty: 50 mL, Refills: 1    lactulose (CHRONULAC) 10 GM/15ML solution Take 30 mLs (20 g total) by mouth 2 (two) times daily. Qty: 240 mL, Refills: 0    metoprolol tartrate (LOPRESSOR) 25 MG tablet Take 1 tablet (25 mg total) by mouth 2 (two) times daily. Qty: 60 tablet, Refills: 0    senna-docusate (SENOKOT-S) 8.6-50 MG tablet Take 1 tablet by mouth 2 (two) times daily. Qty: 30 tablet, Refills: 0      CONTINUE these medications which have NOT CHANGED   Details  ALPRAZolam (XANAX) 1 MG tablet Take 1 tablet (1 mg total) by mouth 3 (three) times daily as needed for anxiety. Qty: 100 tablet, Refills: 0   Associated Diagnoses: Renal cancer, unspecified laterality ([place]); Bone metastasis ([place]); Perirectal abscess; Pancreatic mass; Anxiety  calcium carbonate (TUMS - DOSED IN MG ELEMENTAL CALCIUM) 500 MG chewable tablet Chew 1 tablet by mouth as needed for indigestion or heartburn.    diphenhydrAMINE (BENADRYL) 25 mg capsule Take 25 mg by mouth every 6 (six) hours as needed for itching.    DULoxetine (CYMBALTA) 60 MG capsule TAKE ONE CAPSULE BY MOUTH TWICE DAILY Qty: 60 capsule, Refills: 1    gabapentin (NEURONTIN) 300 MG capsule Take 1 capsule  (300 mg total) by mouth 3 (three) times daily. Qty: 90 capsule, Refills: 3   Associated Diagnoses: Renal cancer, unspecified laterality ([place]); Bone metastasis ([place]); Pancreatic mass; Anxiety; Perirectal abscess    HYDROmorphone (DILAUDID) 8 MG tablet Take 1 tablet (8 mg total) by mouth every 4 (four) hours as needed for moderate pain or severe pain. Qty: 50 tablet, Refills: 0   Associated Diagnoses: Renal cancer, unspecified laterality ([place]); Bone metastasis ([place]); Perirectal abscess; Pancreatic mass; Anxiety    ibuprofen (ADVIL,MOTRIN) 200 MG tablet Take 400 mg by mouth every 6 (six) hours as needed for fever, headache, moderate pain or cramping.    insulin glargine (LANTUS) 100 UNIT/ML injection Inject 26 Units into the skin at bedtime.    tiZANidine (ZANAFLEX) 4 MG tablet Take 4 mg by mouth every 8 (eight) hours as needed for muscle spasms.  Refills: 0    VIAGRA 100 MG tablet Take 50 mg by mouth as needed for erectile dysfunction.    Associated Diagnoses: Renal cancer, unspecified laterality (HCC)    Cabozantinib S-Malate (CABOMETYX) 60 MG TABS Take 60 mg by mouth daily. Qty: 60 tablet, Refills: 0      STOP taking these medications     megestrol (MEGACE) 400 MG/10ML suspension        Allergies  Allergen Reactions  . Ceftriaxone Hives  . Hydrocodone Swelling   Follow-up Information    Follow up with Keith Pel, MD. Schedule an appointment as soon as possible for a visit in 1 week.   Specialty:  Internal Medicine   Contact information:   [address] [place] [address] [phone]       Follow up with [facility], MD In 1 week.   Specialty:  Oncology   Contact information:   [place] [address] [address] [phone]        The results of significant diagnostics from this hospitalization (including imaging, microbiology, ancillary and laboratory) are listed below for reference.    Significant Diagnostic Studies: Dg Chest  2 View  11/07/2015  CLINICAL DATA:  New onset left upper abdominal and lower chest pain with shortness of breath. Symptoms worsening since yesterday. History of metastatic renal cell carcinoma with metastasis to lung, currently on chemotherapy. EXAM: CHEST  2 VIEW COMPARISON:  Chest CT 09/12/2015 06/20/2015 FINDINGS: Emphysematous change at the lung apices. Right suprahilar scarring, unchanged. Right infrahilar prominence, corresponds to hilar soft tissue density on CT. The heart is normal in size. No pulmonary edema, confluent airspace disease, pleural effusion or pneumothorax. No acute osseous abnormalities are seen. IMPRESSION: Emphysema and scarring in the right upper lobe. Right infrahilar prominence corresponding to soft tissue on prior CT. No definite superimposed acute process. Electronically Signed   By: Jeb Levering M.D.   On: 11/07/2015 07:00   Dg Abd 1 View  11/08/2015  CLINICAL DATA:  Abdominal pain with vomiting EXAM: ABDOMEN - 1 VIEW COMPARISON:  CT abdomen 09/12/2015 FINDINGS: Contrast filled urinary bladder from contrast-enhanced CT chest yesterday. Urinary bladder is normal. Electronic pain pump noted  on the left with catheter extending into the spinal canal. Normal bowel gas pattern. Moderate constipation. Mild degenerative change in both hip joints. No acute bony abnormality. Negative for urinary tract calculi IMPRESSION: Constipation Electronically Signed   By: Franchot Gallo M.D.   On: 11/08/2015 10:36   Ct Angio Chest Pe W/cm &/or Wo Cm  11/07/2015  CLINICAL DATA:  Left-sided chest pain and shortness of Breath EXAM: CT ANGIOGRAPHY CHEST WITH CONTRAST TECHNIQUE: Multidetector CT imaging of the chest was performed using the standard protocol during bolus administration of intravenous contrast. Multiplanar CT image reconstructions and MIPs were obtained to evaluate the vascular anatomy. CONTRAST:  184m OMNIPAQUE IOHEXOL 350 MG/ML SOLN COMPARISON:  06/20/2015 FINDINGS: The lungs are  well aerated bilaterally. Some emphysematous changes are noted. Additionally there are changes consistent with prior surgery on the right with radiation change. This is most noted in the upper lobe. Persistent central soft tissue density is noted in the right middle lobe measuring approximately 2.1 cm in greatest dimension. This is roughly stable from the prior exam. No new focal mass lesion is seen. Soft tissue density consistent with lymphadenopathy is noted along the left lower lobe pulmonary arterial branch stable from the previous exam. The thoracic inlet is within normal limits. The thoracic aorta shows mild aneurysmal dilatation of the ascending aorta measuring 4.2 cm at the level of the main pulmonary artery. There are multiple filling defects noted within the pulmonary arterial branches involving the lingula and left lower lobe. The pulmonary arterial branches on the right are within normal limits without evidence of pulmonary emboli. No evidence of right heart strain is noted. Scanning into the upper abdomen reveals no acute abnormality. The osseous structures are within normal limits. Review of the MIP images confirms the above findings. IMPRESSION: Changes consistent with left sided pulmonary emboli as described. No evidence of right heart strain is noted. Stable scarring in the right upper lobe when compare with the prior exam. There continues to be some soft tissue density in the right middle lobe stable from the prior study. Additionally left hilar adenopathy is noted stable from the previous exam. 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 Recommend annual imaging followup by CTA or MRA. This recommendation follows 2010 ACCF/AHA/AATS/ACR/ASA/SCA/SCAI/SIR/STS/SVM Guidelines for the Diagnosis and Management of Patients with Thoracic Aortic Disease. Circulation. 2010; 121:: [id number] results were called by telephone at the time of interpretation on 11/07/2015 at 8:35 am  [facility], who verbally acknowledged these results. Electronically Signed   By: MInez CatalinaM.D.   On: 11/07/2015 08:35    Microbiology: No results found for this or any previous visit (from the past 240 hour(s)).   Labs: Basic Metabolic Panel:  Recent Labs Lab 11/07/15 0653 11/08/15 0509  NA 137 136  K 4.0 4.0  CL 101 99*  CO2 29 26  GLUCOSE 131* 125*  BUN 9 12  CREATININE 0.90 1.08  CALCIUM 8.8* 8.8*   Liver Function Tests:  Recent Labs Lab 11/08/15 0509  AST 50*  ALT 77*  ALKPHOS 134*  BILITOT 0.5  PROT 8.3*  ALBUMIN 3.7   No results for input(s): LIPASE, AMYLASE in the last 168 hours. No results for input(s): AMMONIA in the last 168 hours. CBC:  Recent Labs Lab 11/07/15 0653 11/08/15 0509  WBC 8.4 8.0  NEUTROABS 5.8  --   HGB 14.5 15.7  HCT 43.1 48.0  MCV 92.9 94.7  PLT 109* 106*  Cardiac Enzymes: No results for input(s): CKTOTAL, CKMB, CKMBINDEX, TROPONINI in the last 168 hours. BNP: BNP (last 3 results) No results for input(s): BNP in the last 8760 hours.  ProBNP (last 3 results) No results for input(s): PROBNP in the last 8760 hours.  CBG:  Recent Labs Lab 11/08/15 1202 11/08/15 1706 11/08/15 2121 11/09/15 0802 11/09/15 1201  GLUCAP 127* 149* 134* 122* 132*       Signed:  Nami Strawder  [facility] 11/09/2015, 2:39 PM

## 2015-11-09 NOTE — Progress Notes (Signed)
Patient's oxygen sats on room air was 88-89%.  Patient placed back on 1L O2 sats came up to 92%.

## 2015-11-09 NOTE — Progress Notes (Signed)
Discussed with patient and spouse discharge instructions, both verbalized agreement and understanding.  Patient's IV was discontinued with no complications.  Patient to go down in wheelchair with all belongings to go home in private vehicle. 

## 2015-11-09 NOTE — Progress Notes (Signed)
Spoke with pt and wife at bedside concerning [place] needs, medication Lovenox and genericco-pay. Information also given to wife for [facility] and [facility] com for discounts. Pt's wife selected [facility] for [facility] and PT. Referral given to in house rep.

## 2015-11-09 NOTE — Progress Notes (Signed)
  Radiation Oncology         [phone] ________________________________  Name: Keith [name]  MRN: [id number]  Date: 11/09/2015  DOB: January 01, 1966  SIMULATION AND TREATMENT PLANNING NOTE    ICD-9-CM ICD-10-CM   1. Bone metastasis (HCC) 198.5 C79.51     DIAGNOSIS:  [name] is a 49 yo gentleman with a 6 cm destructive left mid fibula metastases from renal cell carcinoma  NARRATIVE:  The patient was brought to the [facility].  Identity was confirmed.  All relevant records and images related to the planned course of therapy were reviewed.  The patient freely provided informed written consent to proceed with treatment after reviewing the details related to the planned course of therapy. The consent form was witnessed and verified by the simulation staff.  Then, the patient was set-up in a stable reproducible  supine position for radiation therapy.  CT images were obtained.  Surface markings were placed.  The CT images were loaded into the planning software.  Then the target and avoidance structures were contoured.  Treatment planning then occurred.  The radiation prescription was entered and confirmed.  Then, I designed and supervised the construction of a total of 3 medically necessary complex treatment devices.  I have requested : Isodose Plan.  PLAN:  The patient will receive 8 Gy in 1 fraction.  ________________________________  Sheral Apley Tammi Klippel, M.D.  This document serves as a record of services personally performed by Tyler Pita, MD. It was created on his behalf by Arlyce Harman, a trained medical scribe. The creation of this record is based on the scribe's personal observations and the provider's statements to them. This document has been checked and approved by the attending provider.

## 2015-11-09 NOTE — Care Management Important Message (Signed)
Important Message  Patient Details  Name: Keith [name] MRN: [id number] Date of Birth: 05-26-1966   Medicare Important Message Given:  Yes    Shelda Altes 11/09/2015, 12:44 [place] Message  Patient Details  Name: Keith [name] MRN: [id number] Date of Birth: 1966-01-28   Medicare Important Message Given:  Yes    Shelda Altes 11/09/2015, 12:44 PM

## 2015-11-09 NOTE — Progress Notes (Signed)
IP PROGRESS NOTE  Subjective:   Keith [name] is feeling better this morning his chest pain subsided. He does report generalized arthralgias and myalgias itches chronic in nature.  Objective:  Vital signs in last 24 hours: Temp:  [97.9 F (36.6 C)-98.8 F (37.1 C)] 98 F (36.7 C) (11/18 0521) Pulse Rate:  [71-109] 102 (11/18 0521) Resp:  [26-28] 26 (11/18 0521) BP: (102-139)/(76-101) 130/86 mmHg (11/18 0521) SpO2:  [92 %-95 %] 94 % (11/18 0521) Weight change:  Last BM Date: 11/04/15  Intake/Output from previous day: 11/17 0701 - 11/18 0700 In: 1483.8 [I.V.:1483.8] Out: 1400 [Urine:600; Emesis/NG output:800]  Mouth: mucous membranes moist, pharynx normal without lesions Resp: clear to auscultation bilaterally Cardio: regular rate and rhythm, S1, S2 normal, no murmur, click, rub or gallop GI: soft, non-tender; bowel sounds normal; no masses,  no organomegaly Extremities: extremities normal, atraumatic, no cyanosis or edema  Portacath/PICC-without erythema  Lab Results:  Recent Labs  11/07/15 0653 11/08/15 0509  WBC 8.4 8.0  HGB 14.5 15.7  HCT 43.1 48.0  PLT 109* 106*    BMET  Recent Labs  11/07/15 0653 11/08/15 0509  NA 137 136  K 4.0 4.0  CL 101 99*  CO2 29 26  GLUCOSE 131* 125*  BUN 9 12  CREATININE 0.90 1.08  CALCIUM 8.8* 8.8*    Studies/Results: Dg Abd 1 View  11/08/2015  CLINICAL DATA:  Abdominal pain with vomiting EXAM: ABDOMEN - 1 VIEW COMPARISON:  CT abdomen 09/12/2015 FINDINGS: Contrast filled urinary bladder from contrast-enhanced CT chest yesterday. Urinary bladder is normal. Electronic pain pump noted on the left with catheter extending into the spinal canal. Normal bowel gas pattern. Moderate constipation. Mild degenerative change in both hip joints. No acute bony abnormality. Negative for urinary tract calculi IMPRESSION: Constipation Electronically Signed   By: Franchot Gallo M.D.   On: 11/08/2015 10:36   Ct Angio Chest Pe W/cm &/or Wo  Cm  11/07/2015  CLINICAL DATA:  Left-sided chest pain and shortness of Breath EXAM: CT ANGIOGRAPHY CHEST WITH CONTRAST TECHNIQUE: Multidetector CT imaging of the chest was performed using the standard protocol during bolus administration of intravenous contrast. Multiplanar CT image reconstructions and MIPs were obtained to evaluate the vascular anatomy. CONTRAST:  171m OMNIPAQUE IOHEXOL 350 MG/ML SOLN COMPARISON:  06/20/2015 FINDINGS: The lungs are well aerated bilaterally. Some emphysematous changes are noted. Additionally there are changes consistent with prior surgery on the right with radiation change. This is most noted in the upper lobe. Persistent central soft tissue density is noted in the right middle lobe measuring approximately 2.1 cm in greatest dimension. This is roughly stable from the prior exam. No new focal mass lesion is seen. Soft tissue density consistent with lymphadenopathy is noted along the left lower lobe pulmonary arterial branch stable from the previous exam. The thoracic inlet is within normal limits. The thoracic aorta shows mild aneurysmal dilatation of the ascending aorta measuring 4.2 cm at the level of the main pulmonary artery. There are multiple filling defects noted within the pulmonary arterial branches involving the lingula and left lower lobe. The pulmonary arterial branches on the right are within normal limits without evidence of pulmonary emboli. No evidence of right heart strain is noted. Scanning into the upper abdomen reveals no acute abnormality. The osseous structures are within normal limits. Review of the MIP images confirms the above findings. IMPRESSION: Changes consistent with left sided pulmonary emboli as described. No evidence of right heart strain is noted. Stable scarring in  the right upper lobe when compare with the prior exam. There continues to be some soft tissue density in the right middle lobe stable from the prior study. Additionally left hilar  adenopathy is noted stable from the previous exam. 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 Recommend annual imaging followup by CTA or MRA. This recommendation follows 2010 ACCF/AHA/AATS/ACR/ASA/SCA/SCAI/SIR/STS/SVM Guidelines for the Diagnosis and Management of Patients with Thoracic Aortic Disease. Circulation. 2010; 121: [id number] These results were called by telephone at the time of interpretation on 11/07/2015 at 8:35 am to [facility] [place] , who verbally acknowledged these results. Electronically Signed   By: Inez Catalina M.D.   On: 11/07/2015 08:35    Medications: I have reviewed the patient's current medications.  Assessment/Plan:  49 year old gentleman with the following issues:  1. Pulmonary embolism in the setting of a metastatic renal cell carcinoma. I agree with long-term Lovenox anticoagulation. The duration of anticoagulation is likely indefinite given the advanced nature of his malignancy.  2. Renal cell carcinoma: He is currently on Cabozantinib and have tolerated it reasonably well. He does have occasional mucositis.  3. Mild elevation in his liver function tests: This could be related to Cabozantinib and will be monitored further as an outpatient.  4. Follow-up: He is scheduled for an oncology follow up on 11/09/2015 which obviously will be scheduled to future date. I am okay with discharge once he is ready per the primary team.   LOS: 2 days   [facility] 11/09/2015, 7:32 AM

## 2015-11-09 NOTE — Telephone Encounter (Signed)
Called [facility]   4 thfloor telemetry spoke with RN Maudie Mercury,  Patient is able to come for CT simulation  At 1000am, was medicated for pain, has NS '@75ml'$ /hr, thanked RN then called Ct Sim spoke with [place] RT Therapist and gave okay for patient to come for CT Simulation 9:05 AM

## 2015-11-10 ENCOUNTER — Encounter (HOSPITAL_COMMUNITY): Payer: Self-pay | Admitting: Emergency Medicine

## 2015-11-10 ENCOUNTER — Emergency Department (HOSPITAL_COMMUNITY): Payer: Medicare Other

## 2015-11-10 ENCOUNTER — Inpatient Hospital Stay (HOSPITAL_COMMUNITY)
Admission: EM | Admit: 2015-11-10 | Discharge: 2015-11-14 | DRG: 193 | Disposition: A | Discharge status: Home Health | Payer: Medicare Other | Attending: Internal Medicine | Admitting: Internal Medicine

## 2015-11-10 DIAGNOSIS — Z794 Long term (current) use of insulin: Secondary | ICD-10-CM | POA: Diagnosis not present

## 2015-11-10 DIAGNOSIS — Z923 Personal history of irradiation: Secondary | ICD-10-CM | POA: Diagnosis not present

## 2015-11-10 DIAGNOSIS — R0602 Shortness of breath: Secondary | ICD-10-CM

## 2015-11-10 DIAGNOSIS — J189 Pneumonia, unspecified organism: Secondary | ICD-10-CM | POA: Diagnosis not present

## 2015-11-10 DIAGNOSIS — F1210 Cannabis abuse, uncomplicated: Secondary | ICD-10-CM | POA: Diagnosis present

## 2015-11-10 DIAGNOSIS — I10 Essential (primary) hypertension: Secondary | ICD-10-CM | POA: Diagnosis present

## 2015-11-10 DIAGNOSIS — R0902 Hypoxemia: Secondary | ICD-10-CM

## 2015-11-10 DIAGNOSIS — F1410 Cocaine abuse, uncomplicated: Secondary | ICD-10-CM | POA: Diagnosis not present

## 2015-11-10 DIAGNOSIS — E871 Hypo-osmolality and hyponatremia: Secondary | ICD-10-CM | POA: Diagnosis not present

## 2015-11-10 DIAGNOSIS — D6481 Anemia due to antineoplastic chemotherapy: Secondary | ICD-10-CM | POA: Diagnosis present

## 2015-11-10 DIAGNOSIS — Z905 Acquired absence of kidney: Secondary | ICD-10-CM

## 2015-11-10 DIAGNOSIS — C7800 Secondary malignant neoplasm of unspecified lung: Secondary | ICD-10-CM | POA: Diagnosis present

## 2015-11-10 DIAGNOSIS — C649 Malignant neoplasm of unspecified kidney, except renal pelvis: Secondary | ICD-10-CM | POA: Diagnosis present

## 2015-11-10 DIAGNOSIS — T451X5A Adverse effect of antineoplastic and immunosuppressive drugs, initial encounter: Secondary | ICD-10-CM | POA: Diagnosis not present

## 2015-11-10 DIAGNOSIS — R0789 Other chest pain: Secondary | ICD-10-CM | POA: Diagnosis not present

## 2015-11-10 DIAGNOSIS — D649 Anemia, unspecified: Secondary | ICD-10-CM | POA: Diagnosis not present

## 2015-11-10 DIAGNOSIS — K219 Gastro-esophageal reflux disease without esophagitis: Secondary | ICD-10-CM | POA: Diagnosis not present

## 2015-11-10 DIAGNOSIS — Z888 Allergy status to other drugs, medicaments and biological substances status: Secondary | ICD-10-CM

## 2015-11-10 DIAGNOSIS — Z885 Allergy status to narcotic agent status: Secondary | ICD-10-CM

## 2015-11-10 DIAGNOSIS — E1140 Type 2 diabetes mellitus with diabetic neuropathy, unspecified: Secondary | ICD-10-CM | POA: Diagnosis not present

## 2015-11-10 DIAGNOSIS — Z791 Long term (current) use of non-steroidal anti-inflammatories (NSAID): Secondary | ICD-10-CM | POA: Diagnosis not present

## 2015-11-10 DIAGNOSIS — J181 Lobar pneumonia, unspecified organism: Secondary | ICD-10-CM

## 2015-11-10 DIAGNOSIS — R740 Nonspecific elevation of levels of transaminase and lactic acid dehydrogenase [LDH]: Secondary | ICD-10-CM | POA: Diagnosis present

## 2015-11-10 DIAGNOSIS — E440 Moderate protein-calorie malnutrition: Secondary | ICD-10-CM | POA: Diagnosis present

## 2015-11-10 DIAGNOSIS — F419 Anxiety disorder, unspecified: Secondary | ICD-10-CM | POA: Diagnosis not present

## 2015-11-10 DIAGNOSIS — Z79899 Other long term (current) drug therapy: Secondary | ICD-10-CM | POA: Diagnosis not present

## 2015-11-10 DIAGNOSIS — F17210 Nicotine dependence, cigarettes, uncomplicated: Secondary | ICD-10-CM | POA: Diagnosis not present

## 2015-11-10 DIAGNOSIS — Z85528 Personal history of other malignant neoplasm of kidney: Secondary | ICD-10-CM | POA: Diagnosis not present

## 2015-11-10 DIAGNOSIS — R079 Chest pain, unspecified: Secondary | ICD-10-CM | POA: Diagnosis present

## 2015-11-10 DIAGNOSIS — T402X5A Adverse effect of other opioids, initial encounter: Secondary | ICD-10-CM | POA: Diagnosis present

## 2015-11-10 DIAGNOSIS — K5903 Drug induced constipation: Secondary | ICD-10-CM | POA: Diagnosis present

## 2015-11-10 DIAGNOSIS — G893 Neoplasm related pain (acute) (chronic): Secondary | ICD-10-CM | POA: Diagnosis not present

## 2015-11-10 DIAGNOSIS — M25559 Pain in unspecified hip: Secondary | ICD-10-CM | POA: Diagnosis present

## 2015-11-10 DIAGNOSIS — R0600 Dyspnea, unspecified: Secondary | ICD-10-CM | POA: Diagnosis not present

## 2015-11-10 DIAGNOSIS — F1910 Other psychoactive substance abuse, uncomplicated: Secondary | ICD-10-CM | POA: Diagnosis present

## 2015-11-10 DIAGNOSIS — C7951 Secondary malignant neoplasm of bone: Secondary | ICD-10-CM | POA: Diagnosis present

## 2015-11-10 DIAGNOSIS — Y95 Nosocomial condition: Secondary | ICD-10-CM | POA: Diagnosis present

## 2015-11-10 DIAGNOSIS — J9600 Acute respiratory failure, unspecified whether with hypoxia or hypercapnia: Secondary | ICD-10-CM | POA: Diagnosis present

## 2015-11-10 DIAGNOSIS — E119 Type 2 diabetes mellitus without complications: Secondary | ICD-10-CM

## 2015-11-10 DIAGNOSIS — J9601 Acute respiratory failure with hypoxia: Secondary | ICD-10-CM | POA: Diagnosis not present

## 2015-11-10 DIAGNOSIS — D6959 Other secondary thrombocytopenia: Secondary | ICD-10-CM | POA: Diagnosis not present

## 2015-11-10 DIAGNOSIS — R0781 Pleurodynia: Secondary | ICD-10-CM | POA: Diagnosis not present

## 2015-11-10 DIAGNOSIS — Z6823 Body mass index (BMI) 23.0-23.9, adult: Secondary | ICD-10-CM

## 2015-11-10 DIAGNOSIS — I2699 Other pulmonary embolism without acute cor pulmonale: Secondary | ICD-10-CM | POA: Diagnosis not present

## 2015-11-10 DIAGNOSIS — G620 Drug-induced polyneuropathy: Secondary | ICD-10-CM | POA: Diagnosis present

## 2015-11-10 DIAGNOSIS — R7401 Elevation of levels of liver transaminase levels: Secondary | ICD-10-CM | POA: Diagnosis present

## 2015-11-10 DIAGNOSIS — F32A Depression, unspecified: Secondary | ICD-10-CM | POA: Diagnosis present

## 2015-11-10 DIAGNOSIS — F329 Major depressive disorder, single episode, unspecified: Secondary | ICD-10-CM | POA: Diagnosis present

## 2015-11-10 LAB — I-STAT TROPONIN, ED: Troponin i, poc: 0.01 ng/mL (ref 0.00–0.08)

## 2015-11-10 MED ORDER — HYDROMORPHONE HCL 1 MG/ML IJ SOLN
1.0000 mg | Freq: Once | INTRAMUSCULAR | Status: AC
Start: 2015-11-11 — End: 2015-11-11
  Administered 2015-11-11: 1 mg via INTRAVENOUS
  Filled 2015-11-10: qty 1

## 2015-11-10 NOTE — ED Notes (Signed)
Pt to xray

## 2015-11-10 NOTE — ED Notes (Signed)
EKG given to EDP,Pickering, MD., for review. 

## 2015-11-10 NOTE — ED Notes (Signed)
Brought in by [facility] from home with c/o chest pain with shortness of breath.  Per EMS, pt reported that he started having chest pain with shortness of breath tonight.  Pt was hospitalized for PE on 11/07/2015 and was discharged home yesterday.  On Lovenox 90 mg daily.  Was given O2 at 4L/min d/t O2 sat of 88% on room air by EMS.  Was also given Fentanyl 100 mcg IV en route to ED.  Arrived to ED with respirations even and quiet.
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
## 2015-11-11 ENCOUNTER — Ambulatory Visit
Admit: 2015-11-11 | Discharge: 2015-11-11 | Disposition: A | Discharge status: Home / Self Care | Payer: 59 | Attending: Radiation Oncology | Admitting: Radiation Oncology

## 2015-11-11 ENCOUNTER — Telehealth: Payer: Self-pay | Admitting: *Deleted

## 2015-11-11 ENCOUNTER — Ambulatory Visit: Payer: 59 | Admitting: Radiation Oncology

## 2015-11-11 ENCOUNTER — Emergency Department (HOSPITAL_COMMUNITY): Payer: Medicare Other

## 2015-11-11 DIAGNOSIS — F329 Major depressive disorder, single episode, unspecified: Secondary | ICD-10-CM | POA: Diagnosis present

## 2015-11-11 DIAGNOSIS — C7800 Secondary malignant neoplasm of unspecified lung: Secondary | ICD-10-CM | POA: Diagnosis present

## 2015-11-11 DIAGNOSIS — D6481 Anemia due to antineoplastic chemotherapy: Secondary | ICD-10-CM | POA: Diagnosis not present

## 2015-11-11 DIAGNOSIS — Z6823 Body mass index (BMI) 23.0-23.9, adult: Secondary | ICD-10-CM | POA: Diagnosis not present

## 2015-11-11 DIAGNOSIS — E119 Type 2 diabetes mellitus without complications: Secondary | ICD-10-CM

## 2015-11-11 DIAGNOSIS — J189 Pneumonia, unspecified organism: Secondary | ICD-10-CM | POA: Diagnosis not present

## 2015-11-11 DIAGNOSIS — K219 Gastro-esophageal reflux disease without esophagitis: Secondary | ICD-10-CM | POA: Diagnosis present

## 2015-11-11 DIAGNOSIS — Z885 Allergy status to narcotic agent status: Secondary | ICD-10-CM | POA: Diagnosis not present

## 2015-11-11 DIAGNOSIS — I10 Essential (primary) hypertension: Secondary | ICD-10-CM | POA: Diagnosis present

## 2015-11-11 DIAGNOSIS — Z794 Long term (current) use of insulin: Secondary | ICD-10-CM | POA: Diagnosis not present

## 2015-11-11 DIAGNOSIS — D6959 Other secondary thrombocytopenia: Secondary | ICD-10-CM | POA: Diagnosis present

## 2015-11-11 DIAGNOSIS — R0781 Pleurodynia: Secondary | ICD-10-CM | POA: Diagnosis not present

## 2015-11-11 DIAGNOSIS — D649 Anemia, unspecified: Secondary | ICD-10-CM | POA: Diagnosis not present

## 2015-11-11 DIAGNOSIS — I2699 Other pulmonary embolism without acute cor pulmonale: Secondary | ICD-10-CM | POA: Diagnosis not present

## 2015-11-11 DIAGNOSIS — G893 Neoplasm related pain (acute) (chronic): Secondary | ICD-10-CM | POA: Insufficient documentation

## 2015-11-11 DIAGNOSIS — T451X5A Adverse effect of antineoplastic and immunosuppressive drugs, initial encounter: Secondary | ICD-10-CM

## 2015-11-11 DIAGNOSIS — Y95 Nosocomial condition: Secondary | ICD-10-CM | POA: Diagnosis present

## 2015-11-11 DIAGNOSIS — Z79899 Other long term (current) drug therapy: Secondary | ICD-10-CM | POA: Diagnosis not present

## 2015-11-11 DIAGNOSIS — C7951 Secondary malignant neoplasm of bone: Secondary | ICD-10-CM

## 2015-11-11 DIAGNOSIS — Z905 Acquired absence of kidney: Secondary | ICD-10-CM | POA: Diagnosis not present

## 2015-11-11 DIAGNOSIS — E1140 Type 2 diabetes mellitus with diabetic neuropathy, unspecified: Secondary | ICD-10-CM

## 2015-11-11 DIAGNOSIS — F1210 Cannabis abuse, uncomplicated: Secondary | ICD-10-CM | POA: Diagnosis present

## 2015-11-11 DIAGNOSIS — E871 Hypo-osmolality and hyponatremia: Secondary | ICD-10-CM | POA: Diagnosis present

## 2015-11-11 DIAGNOSIS — R7401 Elevation of levels of liver transaminase levels: Secondary | ICD-10-CM | POA: Diagnosis present

## 2015-11-11 DIAGNOSIS — F419 Anxiety disorder, unspecified: Secondary | ICD-10-CM | POA: Diagnosis present

## 2015-11-11 DIAGNOSIS — K5903 Drug induced constipation: Secondary | ICD-10-CM | POA: Diagnosis present

## 2015-11-11 DIAGNOSIS — F1410 Cocaine abuse, uncomplicated: Secondary | ICD-10-CM | POA: Diagnosis present

## 2015-11-11 DIAGNOSIS — R740 Nonspecific elevation of levels of transaminase and lactic acid dehydrogenase [LDH]: Secondary | ICD-10-CM

## 2015-11-11 DIAGNOSIS — C649 Malignant neoplasm of unspecified kidney, except renal pelvis: Secondary | ICD-10-CM

## 2015-11-11 DIAGNOSIS — R079 Chest pain, unspecified: Secondary | ICD-10-CM | POA: Diagnosis present

## 2015-11-11 DIAGNOSIS — J9600 Acute respiratory failure, unspecified whether with hypoxia or hypercapnia: Secondary | ICD-10-CM | POA: Diagnosis present

## 2015-11-11 DIAGNOSIS — Z923 Personal history of irradiation: Secondary | ICD-10-CM | POA: Diagnosis not present

## 2015-11-11 DIAGNOSIS — G620 Drug-induced polyneuropathy: Secondary | ICD-10-CM | POA: Diagnosis present

## 2015-11-11 DIAGNOSIS — R0789 Other chest pain: Secondary | ICD-10-CM | POA: Diagnosis not present

## 2015-11-11 DIAGNOSIS — E0840 Diabetes mellitus due to underlying condition with diabetic neuropathy, unspecified: Secondary | ICD-10-CM

## 2015-11-11 DIAGNOSIS — T402X5A Adverse effect of other opioids, initial encounter: Secondary | ICD-10-CM

## 2015-11-11 DIAGNOSIS — Z888 Allergy status to other drugs, medicaments and biological substances status: Secondary | ICD-10-CM | POA: Diagnosis not present

## 2015-11-11 DIAGNOSIS — E440 Moderate protein-calorie malnutrition: Secondary | ICD-10-CM | POA: Diagnosis present

## 2015-11-11 DIAGNOSIS — Z791 Long term (current) use of non-steroidal anti-inflammatories (NSAID): Secondary | ICD-10-CM | POA: Diagnosis not present

## 2015-11-11 DIAGNOSIS — J9601 Acute respiratory failure with hypoxia: Secondary | ICD-10-CM | POA: Diagnosis not present

## 2015-11-11 DIAGNOSIS — F1910 Other psychoactive substance abuse, uncomplicated: Secondary | ICD-10-CM

## 2015-11-11 DIAGNOSIS — J181 Lobar pneumonia, unspecified organism: Secondary | ICD-10-CM

## 2015-11-11 DIAGNOSIS — F17210 Nicotine dependence, cigarettes, uncomplicated: Secondary | ICD-10-CM | POA: Diagnosis present

## 2015-11-11 DIAGNOSIS — Z85528 Personal history of other malignant neoplasm of kidney: Secondary | ICD-10-CM | POA: Diagnosis not present

## 2015-11-11 DIAGNOSIS — Z510 Encounter for antineoplastic radiation therapy: Secondary | ICD-10-CM | POA: Diagnosis not present

## 2015-11-11 DIAGNOSIS — R0602 Shortness of breath: Secondary | ICD-10-CM | POA: Diagnosis present

## 2015-11-11 LAB — BASIC METABOLIC PANEL
Anion gap: 8 (ref 5–15)
Anion gap: 10 (ref 5–15)
BUN: 9 mg/dL (ref 6–20)
BUN: 9 mg/dL (ref 6–20)
CO2: 23 mmol/L (ref 22–32)
CO2: 23 mmol/L (ref 22–32)
Calcium: 8.4 mg/dL — ABNORMAL LOW (ref 8.9–10.3)
Calcium: 8.2 mg/dL — ABNORMAL LOW (ref 8.9–10.3)
Chloride: 104 mmol/L (ref 101–111)
Chloride: 101 mmol/L (ref 101–111)
Creatinine, Ser: 0.8 mg/dL (ref 0.61–1.24)
Creatinine, Ser: 0.73 mg/dL (ref 0.61–1.24)
GFR calc Af Amer: >60 mL/min (ref >60)
GFR calc Af Amer: >60 mL/min (ref >60)
GFR calc non Af Amer: >60 mL/min (ref >60)
GFR calc non Af Amer: >60 mL/min (ref >60)
Glucose, Bld: 131 mg/dL — ABNORMAL HIGH (ref 65–99)
Glucose, Bld: 137 mg/dL — ABNORMAL HIGH (ref 65–99)
Potassium: 3.5 mmol/L (ref 3.5–5.1)
Potassium: 3.6 mmol/L (ref 3.5–5.1)
Sodium: 135 mmol/L (ref 135–145)
Sodium: 134 mmol/L — ABNORMAL LOW (ref 135–145)

## 2015-11-11 LAB — LACTIC ACID, PLASMA
Lactic Acid, Venous: 0.7 mmol/L (ref 0.5–2.0)
Lactic Acid, Venous: 0.7 mmol/L (ref 0.5–2.0)

## 2015-11-11 LAB — CBC
HCT: 38.1 % — ABNORMAL LOW (ref 39.0–52.0)
HCT: 35.4 % — ABNORMAL LOW (ref 39.0–52.0)
Hemoglobin: 12.4 g/dL — ABNORMAL LOW (ref 13.0–17.0)
Hemoglobin: 11.4 g/dL — ABNORMAL LOW (ref 13.0–17.0)
MCH: 30.5 pg (ref 26.0–34.0)
MCH: 30 pg (ref 26.0–34.0)
MCHC: 32.5 g/dL (ref 30.0–36.0)
MCHC: 32.2 g/dL (ref 30.0–36.0)
MCV: 93.6 fL (ref 78.0–100.0)
MCV: 93.2 fL (ref 78.0–100.0)
Platelets: 113 10*3/uL — ABNORMAL LOW (ref 150–400)
Platelets: 122 10*3/uL — ABNORMAL LOW (ref 150–400)
RBC: 4.07 MIL/uL — ABNORMAL LOW (ref 4.22–5.81)
RBC: 3.8 MIL/uL — ABNORMAL LOW (ref 4.22–5.81)
RDW: 15.4 % (ref 11.5–15.5)
RDW: 15.5 % (ref 11.5–15.5)
WBC: 6.4 10*3/uL (ref 4.0–10.5)
WBC: 6.4 10*3/uL (ref 4.0–10.5)

## 2015-11-11 LAB — GLUCOSE, CAPILLARY
Glucose-Capillary: 98 mg/dL (ref 65–99)
Glucose-Capillary: 142 mg/dL — ABNORMAL HIGH (ref 65–99)
Glucose-Capillary: 90 mg/dL (ref 65–99)
Glucose-Capillary: 144 mg/dL — ABNORMAL HIGH (ref 65–99)

## 2015-11-11 LAB — PROTIME-INR
INR: 1 (ref 0.00–1.49)
Prothrombin Time: 13.4 seconds (ref 11.6–15.2)

## 2015-11-11 LAB — EXPECTORATED SPUTUM ASSESSMENT W GRAM STAIN, RFLX TO RESP C

## 2015-11-11 LAB — MRSA PCR SCREENING: MRSA by PCR: NEGATIVE

## 2015-11-11 LAB — I-STAT CG4 LACTIC ACID, ED: Lactic Acid, Venous: 0.55 mmol/L (ref 0.5–2.0)

## 2015-11-11 LAB — PROCALCITONIN: Procalcitonin: 3.05 ng/mL

## 2015-11-11 LAB — APTT: aPTT: 34 seconds (ref 24–37)

## 2015-11-11 LAB — BRAIN NATRIURETIC PEPTIDE: B Natriuretic Peptide: 109.2 pg/mL — ABNORMAL HIGH (ref 0.0–100.0)

## 2015-11-11 MED ORDER — GABAPENTIN 300 MG PO CAPS
300.0000 mg | ORAL_CAPSULE | Freq: Three times a day (TID) | ORAL | Status: DC
  Administered 2015-11-11 – 2015-11-14 (×10): 300 mg via ORAL
  Filled 2015-11-11 (×11): qty 1

## 2015-11-11 MED ORDER — METOPROLOL TARTRATE 25 MG PO TABS
25.0000 mg | ORAL_TABLET | Freq: Two times a day (BID) | ORAL | Status: DC
  Administered 2015-11-11 – 2015-11-14 (×8): 25 mg via ORAL
  Filled 2015-11-11 (×7): qty 1

## 2015-11-11 MED ORDER — VANCOMYCIN HCL IN DEXTROSE 1-5 GM/200ML-% IV SOLN
1000.0000 mg | Freq: Three times a day (TID) | INTRAVENOUS | Status: DC
  Administered 2015-11-11 – 2015-11-14 (×10): 1000 mg via INTRAVENOUS
  Filled 2015-11-11 (×12): qty 200

## 2015-11-11 MED ORDER — DULOXETINE HCL 60 MG PO CPEP
60.0000 mg | ORAL_CAPSULE | Freq: Two times a day (BID) | ORAL | Status: DC
  Administered 2015-11-11 – 2015-11-14 (×7): 60 mg via ORAL
  Filled 2015-11-11 (×6): qty 1
  Filled 2015-11-11: qty 2

## 2015-11-11 MED ORDER — HYDROMORPHONE HCL 2 MG/ML IJ SOLN: 2.0000 mg | INTRAMUSCULAR | Status: DC | PRN

## 2015-11-11 MED ORDER — DEXTROSE 5 % IV SOLN
2.0000 g | Freq: Once | INTRAVENOUS | Status: AC
  Administered 2015-11-11: 2 g via INTRAVENOUS
  Filled 2015-11-11: qty 2

## 2015-11-11 MED ORDER — METRONIDAZOLE IN NACL 5-0.79 MG/ML-% IV SOLN
500.0000 mg | Freq: Three times a day (TID) | INTRAVENOUS | Status: DC
  Administered 2015-11-11 – 2015-11-14 (×8): 500 mg via INTRAVENOUS
  Filled 2015-11-11 (×12): qty 100

## 2015-11-11 MED ORDER — DIPHENHYDRAMINE HCL 25 MG PO CAPS: 25.0000 mg | ORAL_CAPSULE | Freq: Four times a day (QID) | ORAL | Status: DC | PRN

## 2015-11-11 MED ORDER — HYDROMORPHONE HCL 1 MG/ML IJ SOLN
1.0000 mg | Freq: Once | INTRAMUSCULAR | Status: AC
  Administered 2015-11-11: 1 mg via INTRAVENOUS
  Filled 2015-11-11: qty 1

## 2015-11-11 MED ORDER — OXYCODONE HCL 5 MG PO TABS
5.0000 mg | ORAL_TABLET | ORAL | Status: DC | PRN
  Administered 2015-11-11 (×3): 5 mg via ORAL
  Administered 2015-11-12 – 2015-11-14 (×6): 10 mg via ORAL
  Filled 2015-11-11 (×3): qty 2
  Filled 2015-11-11: qty 1
  Filled 2015-11-11 (×4): qty 2
  Filled 2015-11-11: qty 1

## 2015-11-11 MED ORDER — ACETAMINOPHEN 325 MG PO TABS: 650.0000 mg | ORAL_TABLET | Freq: Four times a day (QID) | ORAL | Status: DC | PRN

## 2015-11-11 MED ORDER — VANCOMYCIN HCL IN DEXTROSE 1-5 GM/200ML-% IV SOLN
1000.0000 mg | INTRAVENOUS | Status: AC
  Administered 2015-11-11: 1000 mg via INTRAVENOUS
  Filled 2015-11-11: qty 200

## 2015-11-11 MED ORDER — ALPRAZOLAM 1 MG PO TABS
1.0000 mg | ORAL_TABLET | Freq: Three times a day (TID) | ORAL | Status: DC | PRN
  Administered 2015-11-11 (×2): 1 mg via ORAL
  Filled 2015-11-11 (×2): qty 1

## 2015-11-11 MED ORDER — ONDANSETRON HCL 4 MG/2ML IJ SOLN: 4.0000 mg | Freq: Four times a day (QID) | INTRAMUSCULAR | Status: DC | PRN

## 2015-11-11 MED ORDER — OXYCODONE HCL 5 MG PO TABS
5.0000 mg | ORAL_TABLET | ORAL | Status: DC | PRN
  Administered 2015-11-11: 5 mg via ORAL
  Filled 2015-11-11: qty 1

## 2015-11-11 MED ORDER — ENOXAPARIN SODIUM 150 MG/ML ~~LOC~~ SOLN
140.0000 mg | SUBCUTANEOUS | Status: DC
  Administered 2015-11-11: 140 mg via SUBCUTANEOUS
  Filled 2015-11-11: qty 0.93

## 2015-11-11 MED ORDER — LACTULOSE 10 GM/15ML PO SOLN
20.0000 g | Freq: Two times a day (BID) | ORAL | Status: DC
  Administered 2015-11-11 – 2015-11-13 (×6): 20 g via ORAL
  Filled 2015-11-11 (×7): qty 30

## 2015-11-11 MED ORDER — IOHEXOL 300 MG/ML  SOLN
100.0000 mL | Freq: Once | INTRAMUSCULAR | Status: AC | PRN
  Administered 2015-11-11: 75 mL via INTRAVENOUS

## 2015-11-11 MED ORDER — POLYETHYLENE GLYCOL 3350 17 G PO PACK
17.0000 g | PACK | Freq: Every day | ORAL | Status: DC
  Administered 2015-11-11 – 2015-11-12 (×2): 17 g via ORAL
  Filled 2015-11-11 (×4): qty 1

## 2015-11-11 MED ORDER — SODIUM CHLORIDE 0.9 % IV SOLN
INTRAVENOUS | Status: DC
  Administered 2015-11-11 – 2015-11-14 (×2): via INTRAVENOUS

## 2015-11-11 MED ORDER — HYDROMORPHONE HCL 1 MG/ML IJ SOLN
0.5000 mg | INTRAMUSCULAR | Status: DC | PRN
  Administered 2015-11-11 (×2): 1 mg via INTRAVENOUS
  Filled 2015-11-11 (×2): qty 1

## 2015-11-11 MED ORDER — INSULIN ASPART 100 UNIT/ML ~~LOC~~ SOLN: 0.0000 [IU] | Freq: Every day | SUBCUTANEOUS | Status: DC

## 2015-11-11 MED ORDER — VANCOMYCIN HCL IN DEXTROSE 1-5 GM/200ML-% IV SOLN: 1000.0000 mg | Freq: Three times a day (TID) | INTRAVENOUS | Status: DC

## 2015-11-11 MED ORDER — ENOXAPARIN SODIUM 150 MG/ML ~~LOC~~ SOLN
1.5000 mg/kg | SUBCUTANEOUS | Status: DC
  Administered 2015-11-12 – 2015-11-14 (×3): 130 mg via SUBCUTANEOUS
  Filled 2015-11-11 (×3): qty 0.86

## 2015-11-11 MED ORDER — ACETAMINOPHEN 650 MG RE SUPP: 650.0000 mg | Freq: Four times a day (QID) | RECTAL | Status: DC | PRN

## 2015-11-11 MED ORDER — INSULIN ASPART 100 UNIT/ML ~~LOC~~ SOLN
0.0000 [IU] | Freq: Three times a day (TID) | SUBCUTANEOUS | Status: DC
  Administered 2015-11-11 – 2015-11-14 (×4): 1 [IU] via SUBCUTANEOUS

## 2015-11-11 MED ORDER — HYDROMORPHONE HCL 2 MG/ML IJ SOLN
INTRAMUSCULAR | Status: AC
Start: 2015-11-11 — End: 2015-11-11
  Filled 2015-11-11: qty 1

## 2015-11-11 MED ORDER — ONDANSETRON HCL 4 MG PO TABS: 4.0000 mg | ORAL_TABLET | Freq: Four times a day (QID) | ORAL | Status: DC | PRN

## 2015-11-11 MED ORDER — TIZANIDINE HCL 4 MG PO TABS
4.0000 mg | ORAL_TABLET | Freq: Three times a day (TID) | ORAL | Status: DC | PRN
  Filled 2015-11-11: qty 1

## 2015-11-11 MED ORDER — HYDROMORPHONE HCL 1 MG/ML IJ SOLN
1.0000 mg | INTRAMUSCULAR | Status: DC | PRN
  Administered 2015-11-11 – 2015-11-13 (×9): 1 mg via INTRAVENOUS
  Filled 2015-11-11 (×10): qty 1

## 2015-11-11 MED ORDER — SENNOSIDES-DOCUSATE SODIUM 8.6-50 MG PO TABS
1.0000 | ORAL_TABLET | Freq: Two times a day (BID) | ORAL | Status: DC
  Administered 2015-11-11 – 2015-11-13 (×6): 1 via ORAL
  Filled 2015-11-11 (×7): qty 1

## 2015-11-11 MED ORDER — INSULIN GLARGINE 100 UNIT/ML ~~LOC~~ SOLN
26.0000 [IU] | Freq: Every day | SUBCUTANEOUS | Status: DC
  Administered 2015-11-11 – 2015-11-13 (×3): 26 [IU] via SUBCUTANEOUS
  Filled 2015-11-11 (×4): qty 0.26

## 2015-11-11 MED ORDER — CALCIUM CARBONATE ANTACID 500 MG PO CHEW: 1.0000 | CHEWABLE_TABLET | ORAL | Status: DC | PRN

## 2015-11-11 MED ORDER — ONDANSETRON HCL 4 MG/2ML IJ SOLN
4.0000 mg | Freq: Three times a day (TID) | INTRAMUSCULAR | Status: DC | PRN
Start: 2015-11-11 — End: 2015-11-11

## 2015-11-11 MED ORDER — DEXTROSE 5 % IV SOLN
2.0000 g | Freq: Three times a day (TID) | INTRAVENOUS | Status: DC
  Administered 2015-11-11 – 2015-11-14 (×10): 2 g via INTRAVENOUS
  Filled 2015-11-11 (×11): qty 2

## 2015-11-11 MED ORDER — ALUM & MAG HYDROXIDE-SIMETH 200-200-20 MG/5ML PO SUSP: 30.0000 mL | Freq: Four times a day (QID) | ORAL | Status: DC | PRN

## 2015-11-11 MED ORDER — HYDROMORPHONE HCL 2 MG/ML IJ SOLN
2.0000 mg | Freq: Once | INTRAMUSCULAR | Status: AC
  Administered 2015-11-11: 2 mg via INTRAVENOUS
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
## 2015-11-11 NOTE — ED Notes (Signed)
Pt reminded about need for urine sample

## 2015-11-11 NOTE — Telephone Encounter (Signed)
Ms. Keith [name] was recently diagnosed with a PE and now has a diagnosis of pneumonia.  He is currently being transferred out of ICU to room 1415.  He refuses to come for treatment today due to increased chest pain.
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
## 2015-11-11 NOTE — Progress Notes (Signed)
Patient ID: Keith [name], male   DOB: [birth year], 49 y.o.   MRN: [id number]  [facility] PROGRESS NOTE  Broughton Eppinger GGY:[id number] DOB: 06-Jan-1966 DOA: 11/10/2015 PCP: [name], MD   Brief narrative:    49 y.o. male with known metastatic renal cell carcinoma, follows with Dr. [name], currently treated with Cabozantinib and has apparently tolerated it well with occasional mucositis, recently diagnosed with PE and was discharged home on Lovenox (will need long term treatment), now presented to [facility] ED with main concern of several days duration of progressively worsening sharp, left sided chest pain, 8/10 in severity, intermittent in nature and occasionally but not consistently radiating to the entire chest area, worse with deep breaths and with no specific alleviating factors, associated with mixed non productive and productive cough of clear sputum.   In ED, VS notable for oxygen saturation 88 on RA, HR up to 112 and RR up to 26, imaging studies notable for right middle and bilateral lower lobes PNA. Coverage for HCAP started given recent hospital stay and [place] asked to admit for further evaluation.   Assessment/Plan:    Principal Problem:   Acute respiratory failure with hypoxia (HCC) - multifactorial and secondary to pulmonary embolism, RML and bilateral lower lobes PNA (HCAP), ? Aspiration PNA - continue broad spectrum ABX vancomycin and aztreonam day #2/8, will add Flagyl to cover for aspiration PNA  - follow up on sputum analysis  - keep on oxygen via [place] to keep O2 saturation > 90% - narrow down as clinically indicated   Active Problems:   Pulmonary embolus, left (HCC) with associated pulmonary infarction  - continue Lovenox per pharmacy     HCAP (healthcare-associated pneumonia) - RML and bilateral lowe lobes, ? 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 [name] - follow     Renal cancer [facility]) with metastasis to bones and lungs  - follows with Dr. [name], he was notified of pt's admission via EPIC order     Polysubstance abuse-cocaine and THC - UDS pending    Antineoplastic chemotherapy induced anemia - Hg stable, no sings of bleeding - CBC in AM    Hyponatremia - pre renal in etiology - continue IVF and repeat BMP in AM    Cancer related pain - asking for multiple narcotics and frequent dosing - currently on dilaudid and oxycodone  - will keep on current regimen for now and avoid further dose or frequency escalation     Opioid induced constipation - on Senokot BID and Lactulose  - add miralax QD   DVT prophylaxis - pt is on full dose Lovenox for PE   Code Status: Full.  Family Communication:  plan of care discussed with the patient Disposition Plan: Transfer to telemetry bed, d/c by 11/22  IV access:  Peripheral IV  Procedures and diagnostic studies:    Dg Chest 2 View 11/10/2015  Lower lung volumes from prior with increased bibasilar opacities, left greater than right. On the right  this likely represents atelectasis. On the left this likely represents a combination of atelectasis and pulmonary infarct, given the pulmonary emboli in the left lower lobe.  Ct Chest W Contrast 11/11/2015  Acute pulmonary embolism in central left lower lobe pulmonary artery. No CT evidence of right heart strain. New bilateral lower lobe and right middle lobe airspace disease, suspicious for pneumonia. Fluid in right mainstem bronchus raises suspicion for aspiration pneumonia. Stable right  upper lobe and perihilar nodules, which are now partially obscured by airspace disease described above.   Ct Angio Chest Pe W/cm &/or Wo Cm 11/07/2015  Changes consistent with left sided pulmonary emboli as described. No evidence of right heart strain is noted. Stable scarring in the right upper lobe when compare with the prior exam. There continues to be some soft tissue density in the right middle lobe stable from the prior study. Additionally left hilar adenopathy is noted stable from the previous exam. 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 Recommend annual imaging followup by CTA or MRA.  Medical Consultants:  None  Other Consultants:  Nutritionist   IAnti-Infectives:   Vanc and Aztreonam 11/19 --> Metronidazole 11/20 -->  Faye Ramsay, MD  [facility] Pager [phone]  If 7PM-7AM, please contact night-coverage [email or web address] Password [email or web address] 11/11/2015, 11:04 AM   LOS: 0 days   HPI/Subjective: No events overnight. Left sided chest pain 3/10 in severity.   Objective: Filed Vitals:   11/11/15 0700 11/11/15 0800 11/11/15 0900 11/11/15 1000  BP: 127/95 148/108 126/89 125/97  Pulse: 95 114 100 102  Temp:  97.6 F (36.4 C)    TempSrc:  Oral    Resp: '17 30 20 26  '$ Height:      Weight:      SpO2: 96% 94% 94% 95%    Intake/Output Summary (Last 24 hours) at 11/11/15 1104 Last data filed at 11/11/15 1100  Gross per 24 hour  Intake   1100 ml  Output      0 ml  Net   1100 ml    Exam:   General:  Pt is alert, follows commands appropriately, not in acute distress  Cardiovascular: Regular rate and rhythm, no rubs, no gallops  Respiratory: Clear to auscultation bilaterally, rhonchi at bases with no wheezing   Abdomen: Soft, non tender, non distended, bowel sounds present, no guarding  Extremities: No edema, pulses DP and PT palpable bilaterally  Data Reviewed: Basic Metabolic Panel:  Recent Labs Lab 11/07/15 0653 11/08/15 0509  11/10/15 2310 11/11/15 0340  NA 137 136 135 134*  K 4.0 4.0 3.5 3.6  CL 101 99* 104 101  CO2 '29 26 23 23  '$ GLUCOSE 131* 125* 131* 137*  BUN '9 12 9 9  '$ CREATININE 0.90 1.08 0.80 0.73  CALCIUM 8.8* 8.8* 8.4* 8.2*   Liver Function Tests:  Recent Labs Lab 11/08/15 0509  AST 50*  ALT 77*  ALKPHOS 134*  BILITOT 0.5  PROT 8.3*  ALBUMIN 3.7   CBC:  Recent Labs Lab 11/07/15 0653 11/08/15 0509 11/10/15 2310 11/11/15 0340  WBC 8.4 8.0 6.4 6.4  NEUTROABS 5.8  --   --   --   HGB 14.5 15.7 12.4* 11.4*  HCT 43.1 48.0 38.1* 35.4*  MCV 92.9 94.7 93.6 93.2  PLT 109* 106* 113* 122*   CBG:  Recent Labs Lab 11/08/15 1706 11/08/15 2121 11/09/15 0802 11/09/15 1201 11/11/15 0802  GLUCAP 149* 134* 122* 132* 98    Recent Results (from the past 240  hour(s))  Blood culture (routine x 2)     Status: None (Preliminary result)   Collection Time: 11/11/15  1:59 AM  Result Value Ref Range Status   Specimen Description   Final    BLOOD LEFT ANTECUBITAL Performed at [facility]    Special Requests BOTTLES DRAWN AEROBIC AND ANAEROBIC 5CC  Final   Culture PENDING  Incomplete   Report Status PENDING  Incomplete  Blood culture (routine x 2)     Status: None (Preliminary result)   Collection Time: 11/11/15  1:59 AM  Result Value Ref Range Status   Specimen Description BLOOD RIGHT ANTECUBITAL  Final   Special Requests   Final    BOTTLES DRAWN AEROBIC AND ANAEROBIC 10CC Performed at [facility]    Culture PENDING  Incomplete   Report Status PENDING  Incomplete  MRSA PCR Screening     Status: None   Collection Time: 11/11/15  3:38 AM  Result Value Ref Range Status   MRSA by PCR NEGATIVE NEGATIVE Final    Comment:        The GeneXpert MRSA Assay (FDA approved for NASAL specimens only), is one component of a comprehensive MRSA colonization surveillance program. It is not intended to diagnose MRSA infection nor to guide or monitor treatment for MRSA infections.       Scheduled Meds: . aztreonam  2 g Intravenous Q8H  . DULoxetine  60 mg Oral BID  . enoxaparin  140 mg Subcutaneous Q24H  . gabapentin  300 mg Oral TID  . HYDROmorphone      . insulin aspart  0-5 Units Subcutaneous QHS  . insulin aspart  0-9 Units Subcutaneous TID WC  . insulin glargine  26 Units Subcutaneous QHS  . lactulose  20 g Oral BID  . metoprolol tartrate  25 mg Oral BID  . senna-docusate  1 tablet Oral BID  . vancomycin  1,000 mg Intravenous Q8H   Continuous Infusions: . sodium chloride 100 mL/hr at 11/11/15 0400

## 2015-11-11 NOTE — Telephone Encounter (Signed)
FYI for next week. He was supposed to be a port and treat today.

## 2015-11-12 ENCOUNTER — Ambulatory Visit: Payer: 59 | Admitting: Radiation Oncology

## 2015-11-12 DIAGNOSIS — E440 Moderate protein-calorie malnutrition: Secondary | ICD-10-CM | POA: Diagnosis present

## 2015-11-12 DIAGNOSIS — E46 Unspecified protein-calorie malnutrition: Secondary | ICD-10-CM | POA: Insufficient documentation

## 2015-11-12 DIAGNOSIS — E1140 Type 2 diabetes mellitus with diabetic neuropathy, unspecified: Secondary | ICD-10-CM

## 2015-11-12 DIAGNOSIS — R079 Chest pain, unspecified: Secondary | ICD-10-CM

## 2015-11-12 LAB — RAPID URINE DRUG SCREEN, HOSP PERFORMED
Amphetamines: NOT DETECTED
Barbiturates: NOT DETECTED
Benzodiazepines: POSITIVE — AB
Cocaine: NOT DETECTED
Opiates: POSITIVE — AB
Tetrahydrocannabinol: POSITIVE — AB

## 2015-11-12 LAB — COMPREHENSIVE METABOLIC PANEL
ALT: 27 U/L (ref 17–63)
AST: 14 U/L — ABNORMAL LOW (ref 15–41)
Albumin: 2.6 g/dL — ABNORMAL LOW (ref 3.5–5.0)
Alkaline Phosphatase: 81 U/L (ref 38–126)
Anion gap: 5 (ref 5–15)
BUN: 7 mg/dL (ref 6–20)
CO2: 29 mmol/L (ref 22–32)
Calcium: 8.2 mg/dL — ABNORMAL LOW (ref 8.9–10.3)
Chloride: 101 mmol/L (ref 101–111)
Creatinine, Ser: 0.86 mg/dL (ref 0.61–1.24)
GFR calc Af Amer: >60 mL/min (ref >60)
GFR calc non Af Amer: >60 mL/min (ref >60)
Glucose, Bld: 118 mg/dL — ABNORMAL HIGH (ref 65–99)
Potassium: 3.9 mmol/L (ref 3.5–5.1)
Sodium: 135 mmol/L (ref 135–145)
Total Bilirubin: 0.5 mg/dL (ref 0.3–1.2)
Total Protein: 6.6 g/dL (ref 6.5–8.1)

## 2015-11-12 LAB — GLUCOSE, CAPILLARY
Glucose-Capillary: 146 mg/dL — ABNORMAL HIGH (ref 65–99)
Glucose-Capillary: 90 mg/dL (ref 65–99)
Glucose-Capillary: 96 mg/dL (ref 65–99)

## 2015-11-12 LAB — URINALYSIS, ROUTINE W REFLEX MICROSCOPIC
Bilirubin Urine: NEGATIVE
Glucose, UA: NEGATIVE mg/dL
Hgb urine dipstick: NEGATIVE
Ketones, ur: NEGATIVE mg/dL
Leukocytes, UA: NEGATIVE
Nitrite: NEGATIVE
Protein, ur: NEGATIVE mg/dL
Specific Gravity, Urine: 1.014 (ref 1.005–1.030)
pH: 6 (ref 5.0–8.0)

## 2015-11-12 LAB — CBC
HCT: 36 % — ABNORMAL LOW (ref 39.0–52.0)
Hemoglobin: 11.5 g/dL — ABNORMAL LOW (ref 13.0–17.0)
MCH: 30 pg (ref 26.0–34.0)
MCHC: 31.9 g/dL (ref 30.0–36.0)
MCV: 94 fL (ref 78.0–100.0)
Platelets: 115 10*3/uL — ABNORMAL LOW (ref 150–400)
RBC: 3.83 MIL/uL — ABNORMAL LOW (ref 4.22–5.81)
RDW: 15.7 % — ABNORMAL HIGH (ref 11.5–15.5)
WBC: 4.5 10*3/uL (ref 4.0–10.5)

## 2015-11-12 MED ORDER — ENSURE ENLIVE PO LIQD
237.0000 mL | Freq: Two times a day (BID) | ORAL | Status: DC
  Administered 2015-11-12 – 2015-11-13 (×4): 237 mL via ORAL

## 2015-11-12 NOTE — Progress Notes (Signed)
Initial Nutrition Assessment  DOCUMENTATION CODES:   Non-severe (moderate) malnutrition in context of chronic illness  INTERVENTION:  - Will order Ensure Enlive BID, each supplement provides 350 kcal and 20 grams of protein - Encourage intakes of meals and supplements - RD will continue to monitor for needs  NUTRITION DIAGNOSIS:   Increased nutrient needs related to catabolic illness, cancer and cancer related treatments as evidenced by estimated needs.  GOAL:   Patient will meet greater than or equal to 90% of their needs  MONITOR:   PO intake, Supplement acceptance, Weight trends, Labs, I & O's  REASON FOR ASSESSMENT:   Malnutrition Screening Tool, Consult Assessment of nutrition requirement/status  ASSESSMENT:   49 y.o. male with a history of Metastatic Renal Cell Cancer just diagnosed with a Pulmonary Embolism discharged to home on Lovenox Rx who returns to the ED this evening with complaints of Left lateral Chest pain and SOB. He reports having 8/10 Pain and pain is worse with laying down. He was found to have hypoxia with O2 sats of 88% by EMS, and was placed on 2 liters NCO2. He was evaluated in the ED and found to have a Pneumonia on CTA of the Chest, and was placed on antibiotic coverage for HCAP and an Elevated Lactic Acid Level of 0.55.   Pt seen for MST and consult. BMI indicates normal weight status. Per chart review, pt ate 25% of breakfast and 75% of lunch yesterday (11/20). Pt reports taste alteration in the form of lack of taste when eating. Worked with pt to order his breakfast for this AM: [place] toast, [place] sausage, grapes, and sweet tea. He states that his appetite has been decreased the past few months and he has been eating less than usual.   Pt drowsy and requests physical assessment not be done this AM. Pt may meet criteria for severe malnutrition but unable to confirm at this time. Per chart review, pt hast lost 15 lbs (7.3% body weight) in the  past 2 months which is significant for time frame.   Unable to obtain additional information at this time. Will continue to follow and monitor for needs. Will order Ensure Enlive to supplement. Medications reviewed. Labs reviewed; CBGs: 90-144 mg/dL, Na: 134 mmol/L, Ca: 8.2 mg/dL.   Diet Order:  Diet Heart Room service appropriate?: Yes; Fluid consistency:: Thin  Skin:  Reviewed, no issues  Last BM:  11/17  Height:   Ht Readings from Last 1 Encounters:  11/11/15 '6\' 3"'$  (1.905 m)    Weight:   Wt Readings from Last 1 Encounters:  11/11/15 190 lb 4.1 oz (86.3 kg)    Ideal Body Weight:  89.09 kg (kg)  BMI:  Body mass index is 23.78 kg/(m^2).  Estimated Nutritional Needs:   Kcal:  2600-2800  Protein:  105-115 grams  Fluid:  2-2.3 L/day  EDUCATION NEEDS:   No education needs identified at this time     Jarome Matin, RD, LDN Inpatient Clinical Dietitian Pager # [phone] After hours/weekend pager # [phone]

## 2015-11-12 NOTE — Progress Notes (Addendum)
Patient ID: Keith [name], male   DOB: 1966/10/01, 49 y.o.   MRN: [id number] [facility] PROGRESS NOTE  Keith [name] JJH:[id number] DOB: 1966/10/22 DOA: 11/10/2015 PCP: [name], MD  Brief narrative:    49 y.o. male with known past medical history of metastatic renal cell carcinoma, follows with Dr. [name], currently treated with Cabozantinib and has apparently tolerated it well with occasional mucositis, recently diagnosed with PE (on Lovenox and will need long term treatment). Pt presented to [facility] ED with main concern for progressively worsening sharp, left sided chest pain, 8/10 in severity, intermittent in nature and occasionally radiating to the entire chest area, worse with deep breaths and associated with mixed non productive and productive cough of clear sputum.   On admission, pt was found to have right middle and bilateral lower lobe PNA. Coverage for HCAP started given recent hospital stay.  Anticipated discharge: Likely by 11/14/2015. Needs PT eval for safe discharge plan.   Assessment/Plan:    Principal Problem:  Acute respiratory failure with hypoxia (HCC) / HCAP (healthcare-associated pneumonia) /Pulmonary embolus, left (HCC) with associated pulmonary infarction  - Likely multifactorial secondary to pulmonary embolism, RML and bilateral lower lobes PNA (HCAP) - For now continue aztreonam, vanco and flagyl - Continue Lovenox for PE per pharmacy dosing - Stable respiratory status - Blood cultures so far negative  - Sputum culture pending  - Continue oxygen via [place] to keep O2 saturation > 90%   Active Problems:  Chest pain - Likely due to combination of PE and associated pulmonary infarction, pulmonary nodules - Trop WNL - Now resolved    Chemotherapy induced thrombocytopenia - Continue to monitor CBC daily  - Platelets 115 this am - No bleeding    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 [name] of pt's admission    Polysubstance abuse-cocaine and THC - UDS pending as of 11/21   Antineoplastic chemotherapy induced anemia - Hemoglobin stable at 11.4   Hyponatremia - Likely prerenal due to acute infection - Sodium 134, stable    Cancer related pain - Continue pain management efforts, do not escalate narcotics further    Opioid induced constipation - Continue Senokot BID, Lactulose and miralax  - BM today     Moderate protein calorie malnutrition - In the context of chronic illness - Nutrition consulted    DVT Prophylaxis  - On full dose AC with Lovenox for PE  Code Status: Full.  Family Communication:  plan of care discussed with the patient Disposition Plan: Home 11/14/2015  IV access:  Peripheral IV  Procedures and diagnostic studies:    Dg Chest 2 View 11/10/2015   Lower lung volumes from prior with increased bibasilar opacities, left greater than right. On the right this likely represents atelectasis. On the left this likely represents a combination of atelectasis and pulmonary infarct, given the pulmonary emboli in the left lower lobe.   Ct Chest W Contrast 11/11/2015   Acute pulmonary embolism in central left lower lobe pulmonary artery. No CT evidence of right heart strain. New bilateral lower lobe and right middle lobe airspace disease, suspicious for pneumonia. Fluid in right mainstem bronchus raises suspicion for aspiration pneumonia. Stable right upper lobe and  perihilar nodules, which are now partially obscured by airspace disease described above. Continued followup by CT recommended. Critical Value/emergent results were called by telephone at the time of interpretation on 11/11/2015 at 1:25 am to Dr. Linward Natal in the ED, who verbally  acknowledged these results. Electronically Signed   By: Keith [name] M.D.   On: 11/11/2015 01:28   Medical Consultants:  None    Other Consultants:  Nutrition PT  IAnti-Infectives:   Vanc and Aztreonam 11/19 --> Metronidazole 11/20 -->   [name], MD  [facility] Pager [phone]  Time spent in minutes: 25 minutes  If 7PM-7AM, please contact night-coverage [email or web address] Password [facility] 11/12/2015, 12:48 PM   LOS: 1 day    HPI/Subjective: No acute overnight events. Patient reports feeling tired.  Objective: Filed Vitals:   11/11/15 2112 11/12/15 0507 11/12/15 0739 11/12/15 0901  BP: 132/99 127/90 148/99 148/98  Pulse: 94 90  86  Temp: 98.1 F (36.7 C) 98.1 F (36.7 C) 97.5 F (36.4 C)   TempSrc: Oral Oral Oral   Resp: '20 20 20   '$ Height:      Weight:      SpO2: 98% 97% 97%     Intake/Output Summary (Last 24 hours) at 11/12/15 1248 Last data filed at 11/12/15 0400  Gross per 24 hour  Intake 1744.17 ml  Output   1000 ml  Net 744.17 ml    Exam:   General:  Pt is alert, follows commands appropriately, not in acute distress  Cardiovascular: Regular rate and rhythm, S1/S2 (+)  Respiratory: Clear to auscultation bilaterally, no wheezing, no crackles, no rhonchi  Abdomen: Soft, non tender, non distended, bowel sounds present  Extremities: No edema, pulses DP and PT palpable bilaterally  Neuro: Grossly nonfocal  Data Reviewed: Basic Metabolic Panel:  Recent Labs Lab 11/07/15 0653 11/08/15 0509 11/10/15 2310 11/11/15 0340 11/12/15 0516  NA 137 136 135 134* 135  K 4.0 4.0 3.5 3.6 3.9  CL 101 99* 104 101 101  CO2 '29 26 23 23 29  '$ GLUCOSE 131* 125* 131* 137* 118*  BUN '9 12 9 9 7  '$ CREATININE 0.90 1.08 0.80 0.73 0.86  CALCIUM 8.8* 8.8* 8.4* 8.2* 8.2*   Liver Function Tests:  Recent Labs Lab 11/08/15 0509 11/12/15 0516  AST 50* 14*  ALT 77* 27  ALKPHOS 134* 81  BILITOT 0.5 0.5  PROT 8.3* 6.6  ALBUMIN 3.7 2.6*   No results for  input(s): LIPASE, AMYLASE in the last 168 hours. No results for input(s): AMMONIA in the last 168 hours. CBC:  Recent Labs Lab 11/07/15 0653 11/08/15 0509 11/10/15 2310 11/11/15 0340 11/12/15 0516  WBC 8.4 8.0 6.4 6.4 4.5  NEUTROABS 5.8  --   --   --   --   HGB 14.5 15.7 12.4* 11.4* 11.5*  HCT 43.1 48.0 38.1* 35.4* 36.0*  MCV 92.9 94.7 93.6 93.2 94.0  PLT 109* 106* 113* 122* 115*   Cardiac Enzymes: No results for input(s): CKTOTAL, CKMB, CKMBINDEX, TROPONINI in the last 168 hours. BNP: Invalid input(s): POCBNP CBG:  Recent Labs Lab 11/09/15 1201 11/11/15 0802 11/11/15 1135 11/11/15 1724 11/11/15 2106  GLUCAP 132* 98 142* 90 144*    Recent Results (from the past 240 hour(s))  Blood culture (routine x 2)     Status: None (Preliminary result)   Collection Time: 11/11/15  1:59 AM  Result Value Ref Range Status   Specimen Description BLOOD LEFT ANTECUBITAL  Final   Special Requests BOTTLES DRAWN AEROBIC  AND ANAEROBIC 5CC  Final   Culture   Final    NO GROWTH 1 DAY Performed at [facility]    Report Status PENDING  Incomplete  Blood culture (routine x 2)     Status: None (Preliminary result)   Collection Time: 11/11/15  1:59 AM  Result Value Ref Range Status   Specimen Description BLOOD RIGHT ANTECUBITAL  Final   Special Requests BOTTLES DRAWN AEROBIC AND ANAEROBIC 10CC  Final   Culture   Final    NO GROWTH 1 DAY Performed at [facility]    Report Status PENDING  Incomplete  MRSA PCR Screening     Status: None   Collection Time: 11/11/15  3:38 AM  Result Value Ref Range Status   MRSA by PCR NEGATIVE NEGATIVE Final  Culture, sputum-assessment     Status: None   Collection Time: 11/11/15  5:46 PM  Result Value Ref Range Status   Specimen Description SPUTUM  Final   Special Requests NONE  Final   Sputum evaluation   Final    THIS SPECIMEN IS ACCEPTABLE. RESPIRATORY CULTURE REPORT TO FOLLOW.   Report Status 11/11/2015 FINAL  Final      Scheduled Meds: . aztreonam  2 g Intravenous Q8H  . DULoxetine  60 mg Oral BID  . enoxaparin  1.5 mg/kg Subcutaneous Q24H  . feeding supplement (ENSURE ENLIVE)  237 mL Oral BID BM  . gabapentin  300 mg Oral TID  . insulin aspart  0-5 Units Subcutaneous QHS  . insulin aspart  0-9 Units Subcutaneous TID WC  . insulin glargine  26 Units Subcutaneous QHS  . lactulose  20 g Oral BID  . metoprolol tartrate  25 mg Oral BID  . metronidazole  500 mg Intravenous 3 times per day  . polyethylene glycol  17 g Oral Daily  . senna-docusate  1 tablet Oral BID  . vancomycin  1,000 mg Intravenous Q8H   Continuous Infusions: . sodium chloride 50 mL/hr at 11/11/15 1229

## 2015-11-13 ENCOUNTER — Encounter (HOSPITAL_COMMUNITY): Payer: Self-pay | Admitting: Internal Medicine

## 2015-11-13 ENCOUNTER — Ambulatory Visit: Payer: 59 | Admitting: Radiation Oncology

## 2015-11-13 DIAGNOSIS — I10 Essential (primary) hypertension: Secondary | ICD-10-CM

## 2015-11-13 DIAGNOSIS — C7800 Secondary malignant neoplasm of unspecified lung: Secondary | ICD-10-CM

## 2015-11-13 DIAGNOSIS — F32A Depression, unspecified: Secondary | ICD-10-CM | POA: Diagnosis present

## 2015-11-13 DIAGNOSIS — F329 Major depressive disorder, single episode, unspecified: Secondary | ICD-10-CM

## 2015-11-13 DIAGNOSIS — R0789 Other chest pain: Secondary | ICD-10-CM

## 2015-11-13 LAB — VANCOMYCIN, TROUGH: Vancomycin Tr: 14 ug/mL (ref 10.0–20.0)

## 2015-11-13 LAB — GLUCOSE, CAPILLARY
Glucose-Capillary: 70 mg/dL (ref 65–99)
Glucose-Capillary: 122 mg/dL — ABNORMAL HIGH (ref 65–99)
Glucose-Capillary: 86 mg/dL (ref 65–99)
Glucose-Capillary: 179 mg/dL — ABNORMAL HIGH (ref 65–99)

## 2015-11-13 LAB — STREP PNEUMONIAE URINARY ANTIGEN: Strep Pneumo Urinary Antigen: NEGATIVE

## 2015-11-13 NOTE — Evaluation (Signed)
Physical Therapy Evaluation Patient Details Name: Keith [name] MRN: [id number] DOB: Feb 26, 1966 Today's Date: 11/13/2015   History of Present Illness  49 y.o. male with h/o metastatic renal cancer (mets to lung, bone, pancreas), polysubstance abuse, peripheral neuropathy and recent pulmonary embolism admitted with chest pain, SOB. Dx of acute respiratory failure, PNA.   Clinical Impression  Pt admitted with above diagnosis. Pt currently with functional limitations due to the deficits listed below (see PT Problem List). Pt ambulated 20' with RW, distance limited by 8/10 L sided chest pain (pain meds requested). SaO2 90% on RA walking, HR 103, 3/4 dyspnea with walking.  Pt will benefit from skilled PT to increase their independence and safety with mobility to allow discharge to the venue listed below.       Follow Up Recommendations Home health PT    Equipment Recommendations  Wheelchair (measurements PT) (may need WC if activity tolerance doesn't improve)    Recommendations for Other Services       Precautions / Restrictions Precautions Precautions: Fall Precaution Comments: monitor O2 Restrictions Weight Bearing Restrictions: No      Mobility  Bed Mobility Overal bed mobility: Modified Independent             General bed mobility comments: increased time, HOB up 40*, used rail  Transfers Overall transfer level: Needs assistance Equipment used: Rolling walker (2 wheeled) Transfers: Sit to/from Stand Sit to Stand: Min guard         General transfer comment: increased time, min/guard for safety  Ambulation/Gait Ambulation/Gait assistance: Min guard Ambulation Distance (Feet): 20 Feet Assistive device: Rolling walker (2 wheeled) Gait Pattern/deviations: Step-through pattern;Decreased step length - left;Decreased stance time - right;Decreased stride length;Trunk flexed   Gait velocity interpretation: Below normal speed for age/gender General Gait Details:  increased time, distance limited by 8/10 L sided chest pain, HR 103 with activity, SaO2 90% on RA walking, 3/4 dyspnea with shallow, rapid breathing; applied 2L O2 [place], sats up to 94%  Stairs            Wheelchair Mobility    Modified Rankin (Stroke Patients Only)       Balance Overall balance assessment: Needs assistance   Sitting balance-Leahy Scale: Good       Standing balance-Leahy Scale: Fair                               Pertinent Vitals/Pain Pain Assessment: 0-10 Pain Score: 8  Pain Location: L sided chest pain Pain Descriptors / Indicators: Sharp Pain Intervention(s): Monitored during session;Patient requesting pain meds-RN notified;Limited activity within patient's tolerance    Home Living Family/patient expects to be discharged to:: Private residence Living Arrangements: Spouse/significant other Available Help at Discharge: Family;Available 24 hours/day Type of Home: House Home Access: Stairs to enter   [employer] of Steps: 2 Home Layout: One level Home Equipment: Walker - 2 wheels;Cane - single point      Prior Function Level of Independence: Independent with assistive device(s)         Comments: uses cane or RW prn     Hand Dominance        Extremity/Trunk Assessment   Upper Extremity Assessment: Overall WFL for tasks assessed           Lower Extremity Assessment: LLE deficits/detail   LLE Deficits / Details: knee extension -3/5, limited by pain  Cervical / Trunk Assessment: Normal  Communication   Communication: No difficulties  Cognition Arousal/Alertness: Awake/alert Behavior During Therapy: WFL for tasks assessed/performed Overall Cognitive Status: Within Functional Limits for tasks assessed                      General Comments      Exercises        Assessment/Plan    PT Assessment Patient needs continued PT services  PT Diagnosis Difficulty walking;Generalized weakness;Acute  pain   PT Problem List Decreased activity tolerance;Decreased strength;Pain;Decreased mobility  PT Treatment Interventions Gait training;Functional mobility training;Therapeutic activities;Patient/family education;Stair training;Therapeutic exercise   PT Goals (Current goals can be found in the Care Plan section) Acute Rehab PT Goals Patient Stated Goal: decrease pain PT Goal Formulation: With patient Time For Goal Achievement: 11/27/15 Potential to Achieve Goals: Fair    Frequency Min 3X/week   Barriers to discharge        Co-evaluation               End of Session Equipment Utilized During Treatment: Gait belt;Oxygen Activity Tolerance: Patient tolerated treatment well Patient left: in bed;with call bell/phone within reach Nurse Communication: Mobility status         Time: 0254-8628 PT Time Calculation (min) (ACUTE ONLY): 34 min   Charges:   PT Evaluation $Initial PT Evaluation Tier I: 1 Procedure PT Treatments $Gait Training: 8-22 mins   PT G Codes:        Philomena Doheny 11/13/2015, 1:43 PM [phone]

## 2015-11-13 NOTE — Progress Notes (Signed)
ANTIBIOTIC CONSULT NOTE - INITIAL  Pharmacy Consult for Vancomycin & Aztreonam Indication: pneumonia  Allergies  Allergen Reactions  . Ceftriaxone Hives  . Hydrocodone Swelling    Patient Measurements: Height: '6\' 3"'$  (190.5 cm) Weight: 190 lb 4.1 oz (86.3 kg) IBW/kg (Calculated) : 84.5  Vital Signs: Temp: 98 F (36.7 C) (11/22 1315) Temp Source: Oral (11/22 0439) BP: 147/99 mmHg (11/22 1315) Pulse Rate: 103 (11/22 1336) Intake/Output from previous day: 11/21 0701 - 11/22 0700 In: 1691.7 [P.O.:600; I.V.:691.7; IV Piggyback:400] Out: 1000 [Urine:1000] Intake/Output from this shift: Total I/O In: 360 [P.O.:360] Out: -   Labs:  Recent Labs  11/10/15 2310 11/11/15 0340 11/12/15 0516  WBC 6.4 6.4 4.5  HGB 12.4* 11.4* 11.5*  PLT 113* 122* 115*  CREATININE 0.80 0.73 0.86   Estimated Creatinine Clearance: 125.5 mL/min (by C-G formula based on Cr of 0.86).  Recent Labs  11/13/15 1235  [place]     Microbiology: Recent Results (from the past 720 hour(s))  Blood culture (routine x 2)     Status: None (Preliminary result)   Collection Time: 11/11/15  1:59 AM  Result Value Ref Range Status   Specimen Description BLOOD LEFT ANTECUBITAL  Final   Special Requests BOTTLES DRAWN AEROBIC AND ANAEROBIC 5CC  Final   Culture   Final    NO GROWTH 2 DAYS Performed at [facility]    Report Status PENDING  Incomplete  Blood culture (routine x 2)     Status: None (Preliminary result)   Collection Time: 11/11/15  1:59 AM  Result Value Ref Range Status   Specimen Description BLOOD RIGHT ANTECUBITAL  Final   Special Requests BOTTLES DRAWN AEROBIC AND ANAEROBIC 10CC  Final   Culture   Final    NO GROWTH 2 DAYS Performed at [facility]    Report Status PENDING  Incomplete  MRSA PCR Screening     Status: None   Collection Time: 11/11/15  3:38 AM  Result Value Ref Range Status   MRSA by PCR NEGATIVE NEGATIVE Final    Comment:        The GeneXpert MRSA  Assay (FDA approved for NASAL specimens only), is one component of a comprehensive MRSA colonization surveillance program. It is not intended to diagnose MRSA infection nor to guide or monitor treatment for MRSA infections.   Culture, respiratory (NON-Expectorated)     Status: None (Preliminary result)   Collection Time: 11/11/15  5:45 PM  Result Value Ref Range Status   Specimen Description SPUTUM  Final   Special Requests NONE  Final   Gram Stain   Final    MODERATE WBC PRESENT, PREDOMINANTLY PMN ABUNDANT SQUAMOUS EPITHELIAL CELLS PRESENT ABUNDANT GRAM POSITIVE COCCI IN PAIRS FEW GRAM NEGATIVE RODS Performed at [facility]    Culture   Final    Culture reincubated for better growth Performed at [facility]    Report Status PENDING  Incomplete  Culture, sputum-assessment     Status: None   Collection Time: 11/11/15  5:46 PM  Result Value Ref Range Status   Specimen Description SPUTUM  Final   Special Requests NONE  Final   Sputum evaluation   Final    THIS SPECIMEN IS ACCEPTABLE. RESPIRATORY CULTURE REPORT TO FOLLOW.   Report Status 11/11/2015 FINAL  Final    Medical History: History reviewed. No pertinent past medical history.  Medications:  Scheduled:  . aztreonam  2 g Intravenous Q8H  . DULoxetine  60 mg  Oral BID  . enoxaparin  1.5 mg/kg Subcutaneous Q24H  . feeding supplement (ENSURE ENLIVE)  237 mL Oral BID BM  . gabapentin  300 mg Oral TID  . insulin aspart  0-5 Units Subcutaneous QHS  . insulin aspart  0-9 Units Subcutaneous TID WC  . insulin glargine  26 Units Subcutaneous QHS  . lactulose  20 g Oral BID  . metoprolol tartrate  25 mg Oral BID  . metronidazole  500 mg Intravenous 3 times per day  . polyethylene glycol  17 g Oral Daily  . senna-docusate  1 tablet Oral BID  . vancomycin  1,000 mg Intravenous Q8H   Infusions:  . sodium chloride 50 mL/hr at 11/11/15 1229   Assessment:  49 yr male with chest pain and shortness of  breath.  Recently hospitalized 3 days with a PE and is on Lovenox '130mg'$  sq q24h.  CT suspicious for pneumonia  Aztreonam 2gm IV x 1 ordered in ED  Pharmacy consulted to dose Vancomycin & Aztreonam upon admission  CrCl (n) = 106 ml/min  11/20 >>Aztreonam >> 11/20 >>Vanc >>  11/20 >> metronidazole>>    11/20 blood: NGTD 11/20 sputum: NGF 11/20 MRSA PCR negative  Trough/Dose change info: 11/22 VT: 14 mcg/ml   Goal of Therapy:  Vancomycin trough level 15-20 mcg/ml  Plan:   Measure antibiotic drug levels at steady state  Follow up culture results   Continue Vancomycin 1gm IV q8h   Aztreonam 2gm IV q8h  Royetta Asal, PharmD, BCPS Pager [phone] 11/13/2015 1:52 PM

## 2015-11-13 NOTE — Progress Notes (Addendum)
Patient ID: Keith [name], male   DOB: [birth year], 50 y.o.   MRN: [id number] [facility] PROGRESS NOTE  Keith [name] VPX:[id number] DOB: October 01, 1966 DOA: 11/10/2015 PCP: Keith Pel, MD  Brief narrative:    49 y.o. male with known past medical history of metastatic renal cell carcinoma, follows with Dr. Alen [name], currently treated with Cabozantinib and has apparently tolerated it well with occasional mucositis, recently diagnosed with PE (on Lovenox and will need long term treatment). Pt presented to [facility] ED with main concern for progressively worsening sharp, left sided chest pain, 8/10 in severity, intermittent in nature and occasionally radiating to the entire chest area, worse with deep breaths and associated with mixed non productive and productive cough of clear sputum.   On admission, pt was found to have right middle and bilateral lower lobe PNA. Coverage for HCAP started given recent hospital stay.  Anticipated discharge: Likely by 11/14/2015. Awaiting physical therapy evaluation for safe discharge planning.  Assessment/Plan:    Principal Problem:  Acute respiratory failure with hypoxia (HCC) / HCAP (healthcare-associated pneumonia) /Pulmonary embolus, left ([place]) with associated pulmonary infarction  - Likely multifactorial secondary to combination of pulmonary embolism, RML and bilateral lower lobes PNA (HCAP) - We will continue aztreonam, vanco and flagyl for another 24 hours. - We will continue Lovenox for anticoagulation for pulmonary embolism. - Blood cultures so far negative. Strep pneumonia negative. Legionella is pending as of 11/13/2015. - We will continue oxygen support via nasal cannula to keep oxygen saturation above 90%.   Active Problems:  Chest pain - Secondary to combination of PE and associated pulmonary infarction, pulmonary nodules. - No reports of chest pain. - Troponin was within normal limits.   Chemotherapy induced thrombocytopenia -  Stable platelet count. Follow-up CBC tomorrow morning. - No reports of bleeding.    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 - CBGs in past 24 hours: 90, 96, 70 - Continue Keith [name] for neuropathy   Renal cancer ([place]) with metastasis to bones and lungs  - Notified Dr. Alen [name] of pt's admission    Tranaminitis  - Likely from chemo per Dr. Alen [name] - LFT"s WNL   Hyponatremia - Likely prerenal due to acute infection - Sodium stable. Check BMP tomorrow morning    Essential hypertension - Continue metoprolol 25 mg twice daily   Polysubstance abuse-cocaine and THC - UDS significant for presence of opiates, benzodiazepines and THC.   Cancer related pain - Continue current pain management efforts, do not escalate narcotics further    Opioid induced constipation - Continue Keith [name] BID, Lactulose and miralax  - Has had bowel movement in past 24 hours    Depression - Stable. Continue Keith [name].    Moderate protein calorie malnutrition - In the context of chronic illness - Continue nutritional supplementation as recommended by nutritionist   DVT Prophylaxis  - On full dose AC with Lovenox for PE  Code Status: Full.  Family Communication:  plan of care discussed with the patient Disposition Plan: Needs physical therapy evaluation. Anticipated discharge by 11/14/2015.   IV access:  Peripheral IV  Procedures and diagnostic studies:    Dg Chest 2 View 11/10/2015   Lower lung volumes from prior with increased bibasilar opacities, left greater than right. On the right this likely represents atelectasis. On the left  this likely represents a combination of atelectasis and pulmonary infarct, given the pulmonary emboli in the left lower lobe.   Ct Chest W Contrast  11/11/2015   Acute pulmonary embolism in central left lower lobe pulmonary artery. No CT evidence of right heart strain. New bilateral lower lobe and right middle lobe airspace disease, suspicious for pneumonia. Fluid in right mainstem bronchus raises suspicion for aspiration pneumonia. Stable right upper lobe and perihilar nodules, which are now partially obscured by airspace disease described above. Continued followup by CT recommended. Critical Value/emergent results were called by telephone at the time of interpretation on 11/11/2015 at 1:25 am to Dr. Linward [name] in the ED, who verbally acknowledged these results. Electronically Signed   By: Keith [name] M.D.   On: 11/11/2015 01:28   Medical Consultants:  None    Other Consultants:  Nutrition PT  IAnti-Infectives:   Vanc and Aztreonam 11/19 --> Metronidazole 11/20 -->   Keith Kahre, MD  [facility] Pager [phone]  Time spent in minutes: 25 minutes  If 7PM-7AM, please contact night-coverage [email or web address] Password [email or web address] 11/13/2015, 11:12 AM   LOS: 2 days    HPI/Subjective: No acute overnight events. Patient reports pain in the back.  Objective: Filed Vitals:   11/12/15 0901 11/12/15 1354 11/12/15 2202 11/13/15 0439  BP: 148/98 124/85 132/98 142/98  Pulse: 86 94 98 94  Temp:  98.2 F (36.8 C) 98.6 F (37 C) 98 F (36.7 C)  TempSrc:  Oral Oral Oral  Resp:  '19 18 16  '$ Height:      Weight:      SpO2:  93% 98% 93%    Intake/Output Summary (Last 24 hours) at 11/13/15 1112 Last data filed at 11/13/15 0915  Gross per 24 hour  Intake 1881.67 ml  Output   1000 ml  Net 881.67 ml    Exam:   General:  Pt is alert, not in acute distress  Cardiovascular: RRR, (+) S1, S2  Respiratory: Diminished breath sounds, no wheezing  Abdomen: Appreciate bowel sounds, nontender abdomen  Extremities: No leg swelling, pulses palpable  Neuro: Nonfocal  Data Reviewed: Basic Metabolic Panel:  Recent Labs Lab 11/07/15 0653  11/08/15 0509 11/10/15 2310 11/11/15 0340 11/12/15 0516  NA 137 136 135 134* 135  K 4.0 4.0 3.5 3.6 3.9  CL 101 99* 104 101 101  CO2 '29 26 23 23 29  '$ GLUCOSE 131* 125* 131* 137* 118*  BUN '9 12 9 9 7  '$ CREATININE 0.90 1.08 0.80 0.73 0.86  CALCIUM 8.8* 8.8* 8.4* 8.2* 8.2*   Liver Function Tests:  Recent Labs Lab 11/08/15 0509 11/12/15 0516  AST 50* 14*  ALT 77* 27  ALKPHOS 134* 81  BILITOT 0.5 0.5  PROT 8.3* 6.6  ALBUMIN 3.7 2.6*   No results for input(s): LIPASE, AMYLASE in the last 168 hours. No results for input(s): AMMONIA in the last 168 hours. CBC:  Recent Labs Lab 11/07/15 0653 11/08/15 0509 11/10/15 2310 11/11/15 0340 11/12/15 0516  WBC 8.4 8.0 6.4 6.4 4.5  NEUTROABS 5.8  --   --   --   --   HGB 14.5 15.7 12.4* 11.4* 11.5*  HCT 43.1 48.0 38.1* 35.4* 36.0*  MCV 92.9 94.7 93.6 93.2 94.0  PLT 109* 106* 113* 122* 115*   Cardiac Enzymes: No results for input(s): CKTOTAL, CKMB, CKMBINDEX, TROPONINI in the last 168 hours. BNP: Invalid input(s): POCBNP CBG:  Recent Labs Lab 11/11/15 2106 11/12/15 1210 11/12/15 1708 11/12/15 2155 11/13/15 0724  GLUCAP 144*  146* 90 96 70    Recent Results (from the past 240 hour(s))  Blood culture (routine x 2)     Status: None (Preliminary result)   Collection Time: 11/11/15  1:59 AM  Result Value Ref Range Status   Specimen Description BLOOD LEFT ANTECUBITAL  Final   Special Requests BOTTLES DRAWN AEROBIC AND ANAEROBIC 5CC  Final   Culture   Final    NO GROWTH 1 DAY Performed at [facility]    Report Status PENDING  Incomplete  Blood culture (routine x 2)     Status: None (Preliminary result)   Collection Time: 11/11/15  1:59 AM  Result Value Ref Range Status   Specimen Description BLOOD RIGHT ANTECUBITAL  Final   Special Requests BOTTLES DRAWN AEROBIC AND ANAEROBIC 10CC  Final   Culture   Final    NO GROWTH 1 DAY Performed at [facility]    Report Status PENDING  Incomplete  MRSA PCR  Screening     Status: None   Collection Time: 11/11/15  3:38 AM  Result Value Ref Range Status   MRSA by PCR NEGATIVE NEGATIVE Final  Culture, sputum-assessment     Status: None   Collection Time: 11/11/15  5:46 PM  Result Value Ref Range Status   Specimen Description SPUTUM  Final   Special Requests NONE  Final   Sputum evaluation   Final    THIS SPECIMEN IS ACCEPTABLE. RESPIRATORY CULTURE REPORT TO FOLLOW.   Report Status 11/11/2015 FINAL  Final     Scheduled Meds: . aztreonam  2 g Intravenous Q8H  . DULoxetine  60 mg Oral BID  . enoxaparin  1.5 mg/kg Subcutaneous Q24H  . feeding supplement (ENSURE ENLIVE)  237 mL Oral BID BM  . gabapentin  300 mg Oral TID  . insulin aspart  0-5 Units Subcutaneous QHS  . insulin aspart  0-9 Units Subcutaneous TID WC  . insulin glargine  26 Units Subcutaneous QHS  . lactulose  20 g Oral BID  . metoprolol tartrate  25 mg Oral BID  . metronidazole  500 mg Intravenous 3 times per day  . polyethylene glycol  17 g Oral Daily  . senna-docusate  1 tablet Oral BID  . vancomycin  1,000 mg Intravenous Q8H   Continuous Infusions: . sodium chloride 50 mL/hr at 11/11/15 1229

## 2015-11-14 ENCOUNTER — Telehealth: Payer: Self-pay | Admitting: *Deleted

## 2015-11-14 ENCOUNTER — Encounter: Payer: Self-pay | Admitting: Radiation Oncology

## 2015-11-14 ENCOUNTER — Ambulatory Visit
Admit: 2015-11-14 | Discharge: 2015-11-14 | Disposition: A | Discharge status: Home / Self Care | Payer: Medicare Other | Source: Ambulatory Visit | Attending: Radiation Oncology | Admitting: Radiation Oncology

## 2015-11-14 DIAGNOSIS — Z905 Acquired absence of kidney: Secondary | ICD-10-CM | POA: Insufficient documentation

## 2015-11-14 DIAGNOSIS — C649 Malignant neoplasm of unspecified kidney, except renal pelvis: Secondary | ICD-10-CM | POA: Insufficient documentation

## 2015-11-14 DIAGNOSIS — Z794 Long term (current) use of insulin: Secondary | ICD-10-CM | POA: Diagnosis not present

## 2015-11-14 DIAGNOSIS — C7800 Secondary malignant neoplasm of unspecified lung: Secondary | ICD-10-CM | POA: Insufficient documentation

## 2015-11-14 DIAGNOSIS — C7951 Secondary malignant neoplasm of bone: Secondary | ICD-10-CM | POA: Diagnosis not present

## 2015-11-14 DIAGNOSIS — Z510 Encounter for antineoplastic radiation therapy: Secondary | ICD-10-CM | POA: Insufficient documentation

## 2015-11-14 DIAGNOSIS — Z79899 Other long term (current) drug therapy: Secondary | ICD-10-CM | POA: Insufficient documentation

## 2015-11-14 LAB — CULTURE, RESPIRATORY W GRAM STAIN: Culture: NORMAL

## 2015-11-14 LAB — GLUCOSE, CAPILLARY
Glucose-Capillary: 111 mg/dL — ABNORMAL HIGH (ref 65–99)
Glucose-Capillary: 142 mg/dL — ABNORMAL HIGH (ref 65–99)

## 2015-11-14 LAB — LEGIONELLA PNEUMOPHILA SEROGP 1 UR AG: L. pneumophila Serogp 1 Ur Ag: NEGATIVE

## 2015-11-14 MED ORDER — ENSURE ENLIVE PO LIQD: 237.0000 mL | Freq: Two times a day (BID) | ORAL | Status: DC

## 2015-11-14 MED ORDER — LEVOFLOXACIN 750 MG PO TABS: 750.0000 mg | ORAL_TABLET | Freq: Every day | ORAL | Status: DC

## 2015-11-14 NOTE — Telephone Encounter (Signed)
Called [facility] spoke with patient's nurse Jessica,RN, patient stated he would come down for port and treat today at 1230 his appt time, asked RN to medicate patient for pain if needed ,called Miranda RT therapist and confirmed todays treatment 9:25 AM

## 2015-11-14 NOTE — Progress Notes (Signed)
Physical Therapy Treatment Patient Details Name: Keith [name] MRN: [id number] DOB: 04-16-1966 Today's Date: 11/14/2015    History of Present Illness 49 y.o. male with h/o metastatic renal cancer (mets to lung, bone, pancreas), polysubstance abuse, peripheral neuropathy and recent pulmonary embolism admitted with chest pain, SOB. Dx of acute respiratory failure, PNA.     PT Comments    Pt ambulated 5' with RW, distance limited by pain and fatigue. SaO2 83% on RA walking, will need home O2 and WC.   Follow Up Recommendations  Home health PT     Equipment Recommendations  Wheelchair (measurements PT) (may need WC if activity tolerance doesn't improve)    Recommendations for Other Services       Precautions / Restrictions Precautions Precautions: Fall Precaution Comments: monitor O2 Restrictions Weight Bearing Restrictions: No    Mobility  Bed Mobility Overal bed mobility: Modified Independent             General bed mobility comments: increased time, HOB up 40*, used rail  Transfers Overall transfer level: Needs assistance Equipment used: Rolling walker (2 wheeled) Transfers: Sit to/from Stand Sit to Stand: Supervision         General transfer comment: verbal cues for hand placement  Ambulation/Gait Ambulation/Gait assistance: Supervision Ambulation Distance (Feet): 5 Feet Assistive device: Rolling walker (2 wheeled) Gait Pattern/deviations: Step-through pattern;Decreased step length - right;Decreased step length - left;Decreased stride length     General Gait Details: distance limited by pain, SaO2 83% on RA walking, 3/4 dyspnea   Stairs            Wheelchair Mobility    Modified Rankin (Stroke Patients Only)       Balance     Sitting balance-Leahy Scale: Good       Standing balance-Leahy Scale: Fair                      Cognition Arousal/Alertness: Awake/alert Behavior During Therapy: WFL for tasks  assessed/performed Overall Cognitive Status: Within Functional Limits for tasks assessed                      Exercises      General Comments        Pertinent Vitals/Pain Pain Score: 8  Pain Location: L side of chest and LLE Pain Descriptors / Indicators: Sharp Pain Intervention(s): Limited activity within patient's tolerance;Monitored during session;Premedicated before session    Home Living                      Prior Function            PT Goals (current goals can now be found in the care plan section) Acute Rehab PT Goals Patient Stated Goal: decrease pain PT Goal Formulation: With patient Time For Goal Achievement: 11/27/15 Potential to Achieve Goals: Fair Progress towards PT goals: Progressing toward goals    Frequency  Min 3X/week    PT Plan Current plan remains appropriate    Co-evaluation             End of Session Equipment Utilized During Treatment: Gait belt;Oxygen Activity Tolerance: Patient limited by fatigue;Patient limited by pain Patient left: with call bell/phone within reach;in chair;with chair alarm set     Time: 3212-2482 PT Time Calculation (min) (ACUTE ONLY): 17 min  Charges:  $Gait Training: 8-22 mins  G Codes:      Keith [name] 11/14/2015, 11:49 AM [phone]

## 2015-11-14 NOTE — Care Management Important Message (Signed)
Important Message  Patient Details  Name: Avontae Burkhead MRN: [id number] Date of Birth: 10/05/1966   Medicare Important Message Given:  Yes    Camillo Flaming 11/14/2015, 10:10 AMImportant Message  Patient Details  Name: Giancarlos Berendt MRN: [id number] Date of Birth: 07-24-1966   Medicare Important Message Given:  Yes    Camillo Flaming 11/14/2015, 10:09 AMImportant Message  Patient Details  Name: Arkeem Harts MRN: [id number] Date of Birth: 09/19/1966   Medicare Important Message Given:  Yes    Camillo Flaming 11/14/2015, 10:09 AM

## 2015-11-14 NOTE — Telephone Encounter (Signed)
error 

## 2015-11-14 NOTE — Progress Notes (Signed)
Patient arrived back to unit from receiving a Radiation Treatment to his left leg. RN went to room because patient had called out wanting his discharge papers as his ride was here. RN entered the room and found patient profusely sweating on his face, arms, and chest. Patient denied any pain, but did state he was slightly lightheaded at that time. BP 143/99, HR 104, O2 sats 93% on 2L oxygen, temp 97.3. Patient was dyspneic while sitting on the side of the bed, but he does have recent PE and PNA and had already been dyspneic with exertion prior to this episode. RN contacted Radiation Oncology MD, Dr. Lisbeth Renshaw, who stated that he did not think the treatment would cause these side effects. RN also contacted Dr. Charlies Silvers, she instructed RN to have patient rest for about 30 minutes and then to discharge if his symptoms resolved. RN did give patient milk and graham crackers per his request. After patient finished his snack and it had been 30 minutes, the patient was no longer sweating and he reported that he felt better. Patient did receive 1 unit of Novolog insulin prior to his radiation procedure and an afterthought is that maybe he was hypoglycemic, but a CBG was not taken at the time of the episode. Patient was given discharge instruction and they were reviewed with him and his sister. They verbalized understanding.   Othella Boyer [facility] 11/14/2015 3:47 PM

## 2015-11-14 NOTE — Progress Notes (Signed)
INPATIENT   Department of Radiation Oncology  Phone:  [phone] Fax:        [phone]  Weekly Treatment Note    Name: Keith [name] Date: 11/14/2015 MRN: [id number] DOB: 1966-05-09   Current dose: 8 Gy  Current fraction: 1   MEDICATIONS: No current facility-administered medications for this encounter.   Current Outpatient Prescriptions  Medication Sig Dispense Refill  . feeding supplement, ENSURE ENLIVE, (ENSURE ENLIVE) LIQD Take 237 mLs by mouth 2 (two) times daily between meals. 237 mL 12  . levofloxacin (LEVAQUIN) 750 MG tablet Take 1 tablet (750 mg total) by mouth daily. 6 tablet 0   Facility-Administered Medications Ordered in Other Encounters  Medication Dose Route Frequency Provider Last Rate Last Dose  . 0.9 %  sodium chloride infusion   Intravenous Continuous Theodis Blaze, MD 50 mL/hr at 11/14/15 0159    . acetaminophen (TYLENOL) tablet 650 mg  650 mg Oral Q6H PRN Theressa Millard, MD       Or  . acetaminophen (TYLENOL) suppository 650 mg  650 mg Rectal Q6H PRN Theressa Millard, MD      . ALPRAZolam Duanne Moron) tablet 1 mg  1 mg Oral TID PRN Theressa Millard, MD   1 mg at 11/11/15 2013  . alum & mag hydroxide-simeth (MAALOX/MYLANTA) 200-200-20 MG/5ML suspension 30 mL  30 mL Oral Q6H PRN Theressa Millard, MD      . aztreonam (AZACTAM) 2 g in dextrose 5 % 50 mL IVPB  2 g Intravenous Q8H Leann T Poindexter, RPH   2 g at 11/14/15 0907  . calcium carbonate (TUMS - dosed in mg elemental calcium) chewable tablet 200 mg of elemental calcium  1 tablet Oral PRN Theressa Millard, MD      . diphenhydrAMINE (BENADRYL) capsule 25 mg  25 mg Oral Q6H PRN Theressa Millard, MD      . DULoxetine (CYMBALTA) DR capsule 60 mg  60 mg Oral BID Theressa Millard, MD   60 mg at 11/14/15 0907  . enoxaparin (LOVENOX) injection 130 mg  1.5 mg/kg Subcutaneous Q24H Theodis Blaze, MD   130 mg at 11/14/15 0907  . feeding supplement (ENSURE ENLIVE) (ENSURE ENLIVE) liquid 237 mL   237 mL Oral BID BM Maricela Bo [place], RD   237 mL at 11/13/15 1400  . gabapentin (NEURONTIN) capsule 300 mg  300 mg Oral TID Theressa Millard, MD   300 mg at 11/14/15 0907  . HYDROmorphone (DILAUDID) injection 1 mg  1 mg Intravenous Q2H PRN Theodis Blaze, MD   1 mg at 11/13/15 2307  . insulin aspart (novoLOG) injection 0-5 Units  0-5 Units Subcutaneous QHS Theressa Millard, MD   0 Units at 11/11/15 2148  . insulin aspart (novoLOG) injection 0-9 Units  0-9 Units Subcutaneous TID WC Theressa Millard, MD   1 Units at 11/14/15 1209  . insulin glargine (LANTUS) injection 26 Units  26 Units Subcutaneous QHS Theressa Millard, MD   26 Units at 11/13/15 2300  . lactulose (CHRONULAC) 10 GM/15ML solution 20 g  20 g Oral BID Theressa Millard, MD   20 g at 11/13/15 2300  . metoprolol tartrate (LOPRESSOR) tablet 25 mg  25 mg Oral BID Theressa Millard, MD   25 mg at 11/14/15 0908  . metroNIDAZOLE (FLAGYL) IVPB 500 mg  500 mg Intravenous 3 times per day Theodis Blaze, MD   500 mg at 11/14/15 0556  . ondansetron (  ZOFRAN) tablet 4 mg  4 mg Oral Q6H PRN Theressa Millard, MD       Or  . ondansetron (ZOFRAN) injection 4 mg  4 mg Intravenous Q6H PRN Theressa Millard, MD      . oxyCODONE (Oxy IR/ROXICODONE) immediate release tablet 5-10 mg  5-10 mg Oral Q3H PRN Theodis Blaze, MD   10 mg at 11/14/15 1157  . polyethylene glycol (MIRALAX / GLYCOLAX) packet 17 g  17 g Oral Daily Theodis Blaze, MD   17 g at 11/12/15 0901  . senna-docusate (Senokot-S) tablet 1 tablet  1 tablet Oral BID Theressa Millard, MD   1 tablet at 11/13/15 2300  . tiZANidine (ZANAFLEX) tablet 4 mg  4 mg Oral Q8H PRN Theressa Millard, MD      . vancomycin (VANCOCIN) IVPB 1000 mg/200 mL premix  1,000 mg Intravenous Q8H Leann T Poindexter, RPH   1,000 mg at 11/14/15 1157     ALLERGIES: Ceftriaxone and Hydrocodone   LABORATORY DATA:  Lab Results  Component Value Date   WBC 4.5 11/12/2015   HGB 11.5* 11/12/2015   HCT 36.0*  11/12/2015   MCV 94.0 11/12/2015   PLT 115* 11/12/2015   Lab Results  Component Value Date   NA 135 11/12/2015   K 3.9 11/12/2015   CL 101 11/12/2015   CO2 29 11/12/2015   Lab Results  Component Value Date   ALT 27 11/12/2015   AST 14* 11/12/2015   ALKPHOS 81 11/12/2015   BILITOT 0.5 11/12/2015     NARRATIVE: Keith [name] was seen today for weekly treatment management. The chart was checked and the patient's films were reviewed.  The patient completed his single course of radiation today to the left fibula to a dose of 8 gray. No difficulties during treatment. The patient currently is an inpatient.  PHYSICAL EXAMINATION: vitals were not taken for this visit.     alert, in no acute distress  ASSESSMENT: The patient is doing satisfactorily with treatment.  PLAN: We will continue with the patient's radiation treatment as planned. The patient will follow-up with Dr. Tammi Klippel in one month.

## 2015-11-14 NOTE — Discharge Instructions (Addendum)
Aspiration Pneumonia Aspiration pneumonia is an infection in your lungs. It occurs when food, liquid, or stomach contents (vomit) are inhaled (aspirated) into your lungs. When these things get into your lungs, swelling (inflammation) and infection can occur. This can make it difficult for you to breathe. Aspiration pneumonia is a serious condition and can be life threatening. RISK FACTORS Aspiration pneumonia is more likely to occur when a person's cough (gag) reflex or ability to swallow has been decreased. Some things that can do this include:   Having a brain injury or disease, such as stroke, seizures, Parkinson's disease, dementia, or amyotrophic lateral sclerosis (ALS).   Being given general anesthetic for procedures.   Being in a coma (unconscious).   Having a narrowing of the tube that carries food to the stomach (esophagus).   Drinking too much alcohol. If a person passes out and vomits, vomit can be swallowed into the lungs.   Taking certain medicines, such as tranquilizers or sedatives.  SIGNS AND SYMPTOMS   Coughing after swallowing food or liquids.   Breathing problems, such as wheezing or shortness of breath.   Bluish skin. This can be caused by lack of oxygen.   Coughing up food or mucus. The mucus might contain blood, greenish material, or yellowish-white fluid (pus).   Fever.   Chest pain.   Being more tired than usual (fatigue).   Sweating more than usual.   Bad breath.  DIAGNOSIS  A physical exam will be done. During the exam, the health care provider will listen to your lungs with a stethoscope to check for:   Crackling sounds in the lungs.  Decreased breath sounds.  A rapid heartbeat. Various tests may be ordered. These may include:   Chest X-ray.   CT scan.   Swallowing study. This test looks at how food is swallowed and whether it goes into your breathing tube (trachea) or food pipe (esophagus).   Sputum culture. Saliva and  mucus (sputum) are collected from the lungs or the tubes that carry air to the lungs (bronchi). The sputum is then tested for bacteria.   Bronchoscopy. This test uses a flexible tube (bronchoscope) to see inside the lungs. TREATMENT  Treatment will usually include antibiotic medicines. Other medicines may also be used to reduce fever or pain. You may need to be treated in the hospital. In the hospital, your breathing will be carefully monitored. Depending on how well you are breathing, you may need to be given oxygen, or you may need breathing support from a breathing machine (ventilator). For people who fail a swallowing study, a feeding tube might be placed in the stomach, or they may be asked to avoid certain food textures or liquids when they eat. HOME CARE INSTRUCTIONS   Carefully follow any special eating instructions you were given, such as avoiding certain food textures or thickening liquids. This reduces the risk of developing aspiration pneumonia again.  Only take over-the-counter or prescription medicines as directed by your health care provider. Follow the directions carefully.   If you were prescribed antibiotics, take them as directed. Finish them even if you start to feel better.   Rest as instructed by your health care provider.   Keep all follow-up appointments with your health care provider.  SEEK MEDICAL CARE IF:   You develop worsening shortness of breath, wheezing, or difficulty breathing.   You develop a fever.  You have chest pain.  MAKE SURE YOU:   Understand these instructions.  Will watch your condition.  Will get help right away if you are not doing well or get worse.   This information is not intended to replace advice given to you by your health care provider. Make sure you discuss any questions you have with your health care provider.   Document Released: 10/05/2009 Document Revised: 12/13/2013 Document Reviewed: 05/26/2013 Elsevier Interactive  Patient Education 2016 [employer]  Levofloxacin tablets What is this medicine? LEVOFLOXACIN (lee voe FLOX a sin) is a quinolone antibiotic. It is used to treat certain kinds of bacterial infections. It will not work for colds, flu, or other viral infections. This medicine may be used for other purposes; ask your health care provider or pharmacist if you have questions. What should I tell my health care provider before I take this medicine? They need to know if you have any of these conditions: -bone problems -cerebral disease -history of low levels of potassium in the blood -irregular heartbeat -joint problems -kidney disease -myasthenia gravis -seizures -tendon problems -tingling of the fingers or toes, or other nerve disorder -an unusual or allergic reaction to levofloxacin, other quinolone antibiotics, foods, dyes, or preservatives -pregnant or trying to get pregnant -breast-feeding How should I use this medicine? Take this medicine by mouth with a full glass of water. Follow the directions on the prescription label. This medicine can be taken with or without food. Take your medicine at regular intervals. Do not take your medicine more often than directed. Do not skip doses or stop your medicine early even if you feel better. Do not stop taking except on your doctor's advice. A special MedGuide will be given to you by the pharmacist with each prescription and refill. Be sure to read this information carefully each time. Talk to your pediatrician regarding the use of this medicine in children. While this drug may be prescribed for children as young as 6 months for selected conditions, precautions do apply. Overdosage: If you think you have taken too much of this medicine contact a poison control center or emergency room at once. NOTE: This medicine is only for you. Do not share this medicine with others. What if I miss a dose? If you miss a dose, take it as soon as you remember. If  it is almost time for your next dose, take only that dose. Do not take double or extra doses. What may interact with this medicine? Do not take this medicine with any of the following medications: -arsenic trioxide -chloroquine -droperidol -medicines for irregular heart rhythm like amiodarone, disopyramide, dofetilide, flecainide, quinidine, procainamide, sotalol -some medicines for depression or mental problems like phenothiazines, pimozide, and ziprasidone This medicine may also interact with the following medications: -amoxapine -antacids -birth control pills -cisapride -dairy products -didanosine (ddI) buffered tablets or powder -haloperidol -multivitamins -NSAIDS, medicines for pain and inflammation, like ibuprofen or naproxen -retinoid products like tretinoin or isotretinoin -risperidone -some other antibiotics like clarithromycin or erythromycin -sucralfate -theophylline -warfarin This list may not describe all possible interactions. Give your health care provider a list of all the medicines, herbs, non-prescription drugs, or dietary supplements you use. Also tell them if you smoke, drink alcohol, or use illegal drugs. Some items may interact with your medicine. What should I watch for while using this medicine? Tell your doctor or health care professional if your symptoms do not improve or if they get worse. Drink several glasses of water a day and cut down on drinks that contain caffeine. You must not get dehydrated while taking this medicine. You may get  drowsy or dizzy. Do not drive, use machinery, or do anything that needs mental alertness until you know how this medicine affects you. Do not sit or stand up quickly, especially if you are an older patient. This reduces the risk of dizzy or fainting spells. This medicine can make you more sensitive to the sun. Keep out of the sun. If you cannot avoid being in the sun, wear protective clothing and use a sunscreen. Do not use sun  lamps or tanning beds/booths. Contact your doctor if you get a sunburn. If you are a diabetic monitor your blood glucose carefully. If you get an unusual reading stop taking this medicine and call your doctor right away. Do not treat diarrhea with over-the-counter products. Contact your doctor if you have diarrhea that lasts more than 2 days or if the diarrhea is severe and watery. Avoid antacids, calcium, iron, and zinc products for 2 hours before and 2 hours after taking a dose of this medicine. What side effects may I notice from receiving this medicine? Side effects that you should report to your doctor or health care professional as soon as possible: -allergic reactions like skin rash or hives, swelling of the face, lips, or tongue -anxious -confusion -depressed mood -diarrhea -fast, irregular heartbeat -hallucination, loss of contact with reality -joint, muscle, or tendon pain or swelling -pain, tingling, numbness in the hands or feet -suicidal thoughts or other mood changes -sunburn -unusually weak or tired Side effects that usually do not require medical attention (report to your doctor or health care professional if they continue or are bothersome): -dry mouth -headache -nausea -trouble sleeping This list may not describe all possible side effects. Call your doctor for medical advice about side effects. You may report side effects to FDA at 1-800-FDA-1088. Where should I keep my medicine? Keep out of the reach of children. Store at room temperature between 15 and 30 degrees C (59 and 86 degrees F). Keep in a tightly closed container. Throw away any unused medicine after the expiration date. NOTE: This sheet is a summary. It may not cover all possible information. If you have questions about this medicine, talk to your doctor, pharmacist, or health care provider.    2016, Elsevier/[employer]. (2015-07-19 12:40:18)

## 2015-11-14 NOTE — Care Management Note (Signed)
Case Management Note  Patient Details  Name: Keith [name] MRN: [id number] Date of Birth: 07-19-1966  Subjective/Objective: PT will update home 02 eval note-qualifies for home 02.PT recc-home 02, w/c-ordered.[facility] dme rep Lecretia aware of orders & d/c.[facility] rep-Katie aware of d/c today,already has orders.Patient will stay w/his mother during the [name], [place] [address] patient's cell#[phone].                  Action/Plan:d/c plan home w/HHC/DME.   Expected Discharge Date:                  Expected Discharge Plan:  [place]  In-House Referral:     Discharge planning Services  CM Consult  Post Acute Care Choice:    Choice offered to:     DME Arranged:  Oxygen, Wheelchair manual DME Agency:  [place]:  PT, RN, OT, Nurse's Aide [place] Agency:  [place]  Status of Service:  Completed, signed off  Medicare Important Message Given:  Yes Date Medicare IM Given:    Medicare IM give by:    Date Additional Medicare IM Given:    Additional Medicare Important Message give by:     If discussed at [place] of Stay Meetings, dates discussed:    Additional Comments:  Dessa Phi, RN 11/14/2015, 12:05 PM

## 2015-11-14 NOTE — Discharge Summary (Addendum)
Physician Discharge Summary  Michall Noffke ZOX:[id number] DOB: 1966/07/04 DOA: 11/10/2015  PCP: [name], MD  Admit date: 11/10/2015 Discharge date: 11/14/2015  Recommendations for Outpatient Follow-up:  1. Continue Levaquin for 6 days on discharge for treatment of pneumonia.   Discharge Diagnoses:  Principal Problem:   Acute respiratory failure with hypoxia ([place]) Active Problems:   HCAP (healthcare-associated pneumonia)   Acute pulmonary embolism ([place])   Right middle lobe, bilateral lower lobes pneumonia    Renal cancer ([place])   Polysubstance abuse-cocaine and THC.   Bone metastasis (HCC)   Chest pain   Metastasis to lung [facility])   Chemotherapy induced thrombocytopenia   Antineoplastic chemotherapy induced anemia   Hyponatremia   Cancer related pain   Transaminitis   Diabetes mellitus with diabetic neuropathy, with long-term current use of insulin (HCC)   Therapeutic opioid-induced constipation ([facility])   Malnutrition of moderate degree   Depression   Benign essential HTN    Discharge Condition: stable   Diet recommendation: as tolerated   History of present illness:   49 y.o. male with known past medical history of metastatic renal cell carcinoma, follows with Dr. [name], currently treated with Cabozantinib and has apparently tolerated it well with occasional mucositis, recently diagnosed with PE (on Lovenox and will need long term treatment). Pt presented to [facility] ED with main concern for progressively worsening sharp, left sided chest pain, 8/10 in severity, intermittent in nature and occasionally radiating to the entire chest area, worse with deep breaths and associated with mixed non productive and productive cough of clear sputum.   On admission, pt was found to have right middle and bilateral lower lobe PNA. Coverage for HCAP started given recent hospital stay.  Hospital Course:   Assessment/Plan:    Principal Problem:  Acute respiratory failure with  hypoxia (HCC) / HCAP (healthcare-associated pneumonia) /Pulmonary embolus, left (HCC) with associated pulmonary infarction  - Multifactorial secondary to combination of pulmonary embolism, RML and bilateral lower lobes PNA (HCAP) - Please note what was documented in earlier notes about aspiration pneumonia please note there is no aspiration pneumonia. Pt tolerated regular diet.  - Pt received aztreonam, vanco and flagyl throughout the hospital stay. He will continue Levaquin for 6 days on discharge. - Continue Lovenox for anticoagulation for pulmonary embolism. - Blood cultures showed no growth so far.  - Strep pneumonia negative. Legionella is pending as of 11/14/2015. WIll be followed up on outpt basis but Levaquin would cover for it in case the result is positive.  - O2 sats 83 % with exertion, needs home oxygen  Active Problems:  Chest pain - Secondary to PE and associated pulmonary infarction, pulmonary nodules. - CP resolved  - Troponin was within normal limits.   Chemotherapy induced thrombocytopenia - Stable platelet count.    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 [name] of pt's admission    Tranaminitis  - Likely from chemo per Dr. [name] - LFT"s WNL on this admission    Hyponatremia - Likely prerenal due to acute infection - Sodium stable.   Essential hypertension - Continue metoprolol 25 mg twice daily on discharge    Polysubstance abuse-cocaine and THC - UDS significant for presence of opiates, benzodiazepines and  THC. - Counseled on drug abuse    Cancer related pain - Continue current pain management efforts, do not escalate narcotics further    Opioid induced  constipation - Continue Senokot BID, Lactulose and miralax    Depression - Continue Cymbalta on discharge    Moderate protein calorie malnutrition - In the context of chronic illness - Continue nutritional supplementation  - Seen by nutritionist   DVT Prophylaxis  - Continue Lovenox for PE  Code Status: Full.  Family Communication: plan of care discussed with the patient   IV access:  Peripheral IV  Procedures and diagnostic studies:   Dg Chest 2 View 11/10/2015 Lower lung volumes from prior with increased bibasilar opacities, left greater than right. On the right this likely represents atelectasis. On the left this likely represents a combination of atelectasis and pulmonary infarct, given the pulmonary emboli in the left lower lobe.   Ct Chest W Contrast 11/11/2015 Acute pulmonary embolism in central left lower lobe pulmonary artery. No CT evidence of right heart strain. New bilateral lower lobe and right middle lobe airspace disease, suspicious for pneumonia. Fluid in right mainstem bronchus raises suspicion for aspiration pneumonia. Stable right upper lobe and perihilar nodules, which are now partially obscured by airspace disease described above. Continued followup by CT recommended. Critical Value/emergent results were called by telephone at the time of interpretation on 11/11/2015 at 1:25 am to Dr. Linward Natal in the ED, who verbally acknowledged these results. Electronically Signed By: Earle Gell M.D. On: 11/11/2015 01:28   Medical Consultants:  None   Other Consultants:  Nutrition PT  IAnti-Infectives:   Vanc and Aztreonam 11/19 --> 11/14/2015 Metronidazole 11/20 --> 11/14/2015   Signed:  Leisa Lenz, MD  [facility] 11/14/2015, 8:45 AM  Pager #: [phone]  Time spent in minutes: more than 30 minutes    Discharge Exam: Filed Vitals:   11/14/15 0150 11/14/15 0428  BP: 129/93 138/98  Pulse: 88 87  Temp: 99 F (37.2 C)  97.9 F (36.6 C)  Resp: 20 20   Filed Vitals:   11/13/15 1725 11/13/15 2125 11/14/15 0150 11/14/15 0428  BP: 135/87 134/92 129/93 138/98  Pulse: 89 98 88 87  Temp: 98.1 F (36.7 C) 98.4 F (36.9 C) 99 F (37.2 C) 97.9 F (36.6 C)  TempSrc: Oral Oral Oral Oral  Resp: '20 20 20 20  '$ Height:      Weight:      SpO2: 92% 94% 92% 95%    General: Pt is alert, follows commands appropriately, not in acute distress Cardiovascular: Regular rate and rhythm, S1/S2 +, no murmurs Respiratory: Clear to auscultation bilaterally, no wheezing, no crackles, no rhonchi Abdominal: Soft, non tender, non distended, bowel sounds +, no guarding Extremities: no edema, no cyanosis, pulses palpable bilaterally DP and PT Neuro: Grossly nonfocal  Discharge Instructions  Discharge Instructions    Call MD for:  difficulty breathing, headache or visual disturbances    Complete by:  As directed      Call MD for:  persistant dizziness or light-headedness    Complete by:  As directed      Call MD for:  persistant nausea and vomiting    Complete by:  As directed      Call MD for:  severe uncontrolled pain    Complete by:  As directed      Diet - low sodium heart healthy    Complete by:  As directed      Discharge instructions  Complete by:  As directed   Continue Levaquin as prescribed for treatment of pneumonia.     Increase activity slowly    Complete by:  As directed             Medication List    STOP taking these medications        Cabozantinib S-Malate 60 MG Tabs  Commonly known as:  CABOMETYX      TAKE these medications        ALPRAZolam 1 MG tablet  Commonly known as:  XANAX  Take 1 tablet (1 mg total) by mouth 3 (three) times daily as needed for anxiety.     calcium carbonate 500 MG chewable tablet  Commonly known as:  TUMS - dosed in mg elemental calcium  Chew 1 tablet by mouth as needed for indigestion or heartburn.     diphenhydrAMINE 25 mg capsule  Commonly known as:   BENADRYL  Take 25 mg by mouth every 6 (six) hours as needed for itching.     DULoxetine 60 MG capsule  Commonly known as:  CYMBALTA  TAKE ONE CAPSULE BY MOUTH TWICE DAILY     enoxaparin 150 MG/ML injection  Commonly known as:  LOVENOX  Inject 140 mg into the skin daily.     feeding supplement (ENSURE ENLIVE) Liqd  Take 237 mLs by mouth 2 (two) times daily between meals.     gabapentin 300 MG capsule  Commonly known as:  NEURONTIN  Take 1 capsule (300 mg total) by mouth 3 (three) times daily.     HYDROmorphone 8 MG tablet  Commonly known as:  DILAUDID  Take 1 tablet (8 mg total) by mouth every 4 (four) hours as needed for moderate pain or severe pain.     ibuprofen 200 MG tablet  Commonly known as:  ADVIL,MOTRIN  Take 400 mg by mouth every 6 (six) hours as needed for fever, headache, moderate pain or cramping.     insulin glargine 100 UNIT/ML injection  Commonly known as:  LANTUS  Inject 26 Units into the skin at bedtime.     lactulose 10 GM/15ML solution  Commonly known as:  CHRONULAC  Take 30 mLs (20 g total) by mouth 2 (two) times daily.     levofloxacin 750 MG tablet  Commonly known as:  LEVAQUIN  Take 1 tablet (750 mg total) by mouth daily.     metoprolol tartrate 25 MG tablet  Commonly known as:  LOPRESSOR  Take 1 tablet (25 mg total) by mouth 2 (two) times daily.     senna-docusate 8.6-50 MG tablet  Commonly known as:  Senokot-S  Take 1 tablet by mouth 2 (two) times daily.     tiZANidine 4 MG tablet  Commonly known as:  ZANAFLEX  Take 4 mg by mouth every 8 (eight) hours as needed for muscle spasms.     VIAGRA 100 MG tablet  Generic drug:  sildenafil  Take 50 mg by mouth as needed for erectile dysfunction.           Follow-up Information    Follow up with [name], MD. Schedule an appointment as soon as possible for a visit in 1 week.   Specialty:  Internal Medicine   Why:  Follow up appt after recent hospitalization   Contact  information:   [place] [address] [phone]        The results of significant diagnostics from this hospitalization (including imaging, microbiology, ancillary and  laboratory) are listed below for reference.    Significant Diagnostic Studies: Dg Chest 2 View  11/10/2015  CLINICAL DATA:  Left-sided chest pain, onset this evening. Shortness of breath. Recent diagnosis of pulmonary embolus. EXAM: CHEST  2 VIEW COMPARISON:  Radiographs and CT 11/07/2015 FINDINGS: Lower lung volumes from prior exam. Increased bibasilar opacities, left greater than right. R rounded appearing opacity at the left lung base has no correlate on recent CT. Accentuation of cardiac size, may be related to differences in technique and lower lung volumes. Questionable small pleural effusions. Linear scarring in the right upper lung. No pulmonary edema. IMPRESSION: Lower lung volumes from prior with increased bibasilar opacities, left greater than right. On the right this likely represents atelectasis. On the left this likely represents a combination of atelectasis and pulmonary infarct, given the pulmonary emboli in the left lower lobe. Electronically Signed   By: Jeb Levering M.D.   On: 11/10/2015 23:35   Dg Chest 2 View  11/07/2015  CLINICAL DATA:  New onset left upper abdominal and lower chest pain with shortness of breath. Symptoms worsening since yesterday. History of metastatic renal cell carcinoma with metastasis to lung, currently on chemotherapy. EXAM: CHEST  2 VIEW COMPARISON:  Chest CT 09/12/2015 06/20/2015 FINDINGS: Emphysematous change at the lung apices. Right suprahilar scarring, unchanged. Right infrahilar prominence, corresponds to hilar soft tissue density on CT. The heart is normal in size. No pulmonary edema, confluent airspace disease, pleural effusion or pneumothorax. No acute osseous abnormalities are seen. IMPRESSION: Emphysema and scarring in the right upper lobe.  Right infrahilar prominence corresponding to soft tissue on prior CT. No definite superimposed acute process. Electronically Signed   By: Jeb Levering M.D.   On: 11/07/2015 07:00   Dg Abd 1 View  11/08/2015  CLINICAL DATA:  Abdominal pain with vomiting EXAM: ABDOMEN - 1 VIEW COMPARISON:  CT abdomen 09/12/2015 FINDINGS: Contrast filled urinary bladder from contrast-enhanced CT chest yesterday. Urinary bladder is normal. Electronic pain pump noted on the left with catheter extending into the spinal canal. Normal bowel gas pattern. Moderate constipation. Mild degenerative change in both hip joints. No acute bony abnormality. Negative for urinary tract calculi IMPRESSION: Constipation Electronically Signed   By: Franchot Gallo M.D.   On: 11/08/2015 10:36   Ct Chest W Contrast  11/11/2015  CLINICAL DATA:  Increased pleuritic chest pain. Metastatic renal cell carcinoma. EXAM: CT CHEST WITH CONTRAST TECHNIQUE: Multidetector CT imaging of the chest was performed during intravenous contrast administration. CONTRAST:  57m OMNIPAQUE IOHEXOL 300 MG/ML  SOLN COMPARISON:  11/07/2015 FINDINGS: Mediastinum/Lymph Nodes: No masses or pathologically enlarged lymph nodes identified. Acute pulmonary embolism is seen within central left lower lobe pulmonary artery. No evidence of thoracic aortic aneurysm or dissection. Heart size is normal. No evidence of pericardial effusion. RV/LV ratio is 0.7, within normal limits. Lungs/Pleura: New bilateral lower lobe and right middle lobe airspace disease, suspicious for pneumonia. Fluid level seen in right mainstem bronchus raises suspicion for aspiration pneumonia. Mild emphysema again noted. 1.9 cm central right upper lobe nodule on image 21 remains stable. 2.2 cm right perihilar nodular opacity on image 36 is also unchanged. No evidence of pleural or pericardial effusion. Musculoskeletal/Soft Tissues: No suspicious bone lesions or other significant chest wall abnormality. Upper  Abdomen: Gallbladder is incompletely visualized but shows high attenuation intraluminal sludge. IMPRESSION: Acute pulmonary embolism in central left lower lobe pulmonary artery. No CT evidence of right heart strain. New bilateral lower lobe and right middle lobe airspace  disease, suspicious for pneumonia. Fluid in right mainstem bronchus raises suspicion for aspiration pneumonia. Stable right upper lobe and perihilar nodules, which are now partially obscured by airspace disease described above. Continued followup by CT recommended. Critical Value/emergent results were called by telephone at the time of interpretation on 11/11/2015 at 1:25 am to Dr. Linward Natal in the ED, who verbally acknowledged these results. Electronically Signed   By: Earle Gell M.D.   On: 11/11/2015 01:28   Ct Angio Chest Pe W/cm &/or Wo Cm  11/07/2015  CLINICAL DATA:  Left-sided chest pain and shortness of Breath EXAM: CT ANGIOGRAPHY CHEST WITH CONTRAST TECHNIQUE: Multidetector CT imaging of the chest was performed using the standard protocol during bolus administration of intravenous contrast. Multiplanar CT image reconstructions and MIPs were obtained to evaluate the vascular anatomy. CONTRAST:  184m OMNIPAQUE IOHEXOL 350 MG/ML SOLN COMPARISON:  06/20/2015 FINDINGS: The lungs are well aerated bilaterally. Some emphysematous changes are noted. Additionally there are changes consistent with prior surgery on the right with radiation change. This is most noted in the upper lobe. Persistent central soft tissue density is noted in the right middle lobe measuring approximately 2.1 cm in greatest dimension. This is roughly stable from the prior exam. No new focal mass lesion is seen. Soft tissue density consistent with lymphadenopathy is noted along the left lower lobe pulmonary arterial branch stable from the previous exam. The thoracic inlet is within normal limits. The thoracic aorta shows mild aneurysmal dilatation of the ascending aorta measuring  4.2 cm at the level of the main pulmonary artery. There are multiple filling defects noted within the pulmonary arterial branches involving the lingula and left lower lobe. The pulmonary arterial branches on the right are within normal limits without evidence of pulmonary emboli. No evidence of right heart strain is noted. Scanning into the upper abdomen reveals no acute abnormality. The osseous structures are within normal limits. Review of the MIP images confirms the above findings. IMPRESSION: Changes consistent with left sided pulmonary emboli as described. No evidence of right heart strain is noted. Stable scarring in the right upper lobe when compare with the prior exam. There continues to be some soft tissue density in the right middle lobe stable from the prior study. Additionally left hilar adenopathy is noted stable from the previous exam. 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 Recommend annual imaging followup by CTA or MRA. This recommendation follows 2010 ACCF/AHA/AATS/ACR/ASA/SCA/SCAI/SIR/STS/SVM Guidelines for the Diagnosis and Management of Patients with Thoracic Aortic Disease. Circulation. 2010; 121:: [id number] results were called by telephone at the time of interpretation on 11/07/2015 at 8:35 am to [facility], who verbally acknowledged these results. Electronically Signed   By: MInez CatalinaM.D.   On: 11/07/2015 08:35    Microbiology: Recent Results (from the past 240 hour(s))  Blood culture (routine x 2)     Status: None (Preliminary result)   Collection Time: 11/11/15  1:59 AM  Result Value Ref Range Status   Specimen Description BLOOD LEFT ANTECUBITAL  Final   Special Requests BOTTLES DRAWN AEROBIC AND ANAEROBIC 5CC  Final   Culture   Final    NO GROWTH 2 DAYS [facility]   Report Status PENDING  Incomplete  Blood culture (routine x 2)     Status: None (Preliminary result)   Collection Time: 11/11/15  1:59 AM   Result Value Ref Range Status   Specimen Description BLOOD RIGHT ANTECUBITAL  Final  Special Requests BOTTLES DRAWN AEROBIC AND ANAEROBIC 10CC  Final   Culture   Final    NO GROWTH 2 DAYS Performed at [facility]    Report Status PENDING  Incomplete  MRSA PCR Screening     Status: None   Collection Time: 11/11/15  3:38 AM  Result Value Ref Range Status   MRSA by PCR NEGATIVE NEGATIVE Final  Culture, respiratory (NON-Expectorated)     Status: None (Preliminary result)   Collection Time: 11/11/15  5:45 PM  Result Value Ref Range Status   Specimen Description SPUTUM  Final   Special Requests NONE  Final   Gram Stain   Final    MODERATE WBC PRESENT, PREDOMINANTLY PMN ABUNDANT SQUAMOUS EPITHELIAL CELLS PRESENT ABUNDANT GRAM POSITIVE COCCI IN PAIRS FEW GRAM NEGATIVE RODS Performed at [employer]   Final    Culture reincubated for better growth Performed at [facility]    Report Status PENDING  Incomplete  Culture, sputum-assessment     Status: None   Collection Time: 11/11/15  5:46 PM  Result Value Ref Range Status   Specimen Description SPUTUM  Final   Special Requests NONE  Final   Sputum evaluation   Final    THIS SPECIMEN IS ACCEPTABLE. RESPIRATORY CULTURE REPORT TO FOLLOW.   Report Status 11/11/2015 FINAL  Final     Labs: Basic Metabolic Panel:  Recent Labs Lab 11/08/15 0509 11/10/15 2310 11/11/15 0340 11/12/15 0516  NA 136 135 134* 135  K 4.0 3.5 3.6 3.9  CL 99* 104 101 101  CO2 '26 23 23 29  '$ GLUCOSE 125* 131* 137* 118*  BUN '12 9 9 7  '$ CREATININE 1.08 0.80 0.73 0.86  CALCIUM 8.8* 8.4* 8.2* 8.2*   Liver Function Tests:  Recent Labs Lab 11/08/15 0509 11/12/15 0516  AST 50* 14*  ALT 77* 27  ALKPHOS 134* 81  BILITOT 0.5 0.5  PROT 8.3* 6.6  ALBUMIN 3.7 2.6*   No results for input(s): LIPASE, AMYLASE in the last 168 hours. No results for input(s): AMMONIA in the last 168 hours. CBC:  Recent Labs Lab  11/08/15 0509 11/10/15 2310 11/11/15 0340 11/12/15 0516  WBC 8.0 6.4 6.4 4.5  HGB 15.7 12.4* 11.4* 11.5*  HCT 48.0 38.1* 35.4* 36.0*  MCV 94.7 93.6 93.2 94.0  PLT 106* 113* 122* 115*   Cardiac Enzymes: No results for input(s): CKTOTAL, CKMB, CKMBINDEX, TROPONINI in the last 168 hours. BNP: BNP (last 3 results)  Recent Labs  11/10/15 2326  BNP 109.2*    ProBNP (last 3 results) No results for input(s): PROBNP in the last 8760 hours.  CBG:  Recent Labs Lab 11/12/15 2155 11/13/15 0724 11/13/15 1147 11/13/15 1655 11/13/15 2128  GLUCAP 96 70 122* 86 179*

## 2015-11-14 NOTE — Care Management Note (Signed)
Case Management Note  Patient Details  Name: Keith [name] MRN: [id number] Date of Birth: 19-Nov-1966  Subjective/Objective:[facility] rep Katie aware of d/c orders-[facility], [id number]                    Action/Plan:d/c home w/HHC.   Expected Discharge Date:                  Expected Discharge Plan:  [place] In-House Referral:     Discharge planning Services  CM Consult  Post Acute Care Choice:    Choice offered to:     DME Arranged:    DME Agency:     HH Arranged:  PT, RN, OT, Nurse's Aide [place]:  [place] Status of Service:  Completed, signed off  Medicare Important Message Given:  Yes Date Medicare IM Given:    Medicare IM give by:    Date Additional Medicare IM Given:    Additional Medicare Important Message give by:     If discussed at [place] Meet[place], dates discussed:    Additional Comments:  MDessa Phi RN 11/14/2015, 10:18 AM

## 2015-11-14 NOTE — Progress Notes (Addendum)
SATURATION QUALIFICATIONS: (This note is used to comply with regulatory documentation for home oxygen)  Patient Saturations on Room Air at Rest = 95%  Patient Saturations on Room Air while Ambulating = 83%  Patient Saturation on 2L O2 [place] while ambulating= 94%  Please briefly explain why patient needs home oxygen:to maintain appropriate SaO2 levels.  Blondell Reveal Kistler PT 11/14/2015  [phone]

## 2015-11-15 DIAGNOSIS — I2699 Other pulmonary embolism without acute cor pulmonale: Secondary | ICD-10-CM | POA: Diagnosis not present

## 2015-11-15 DIAGNOSIS — C7889 Secondary malignant neoplasm of other digestive organs: Secondary | ICD-10-CM | POA: Diagnosis not present

## 2015-11-15 DIAGNOSIS — C649 Malignant neoplasm of unspecified kidney, except renal pelvis: Secondary | ICD-10-CM | POA: Diagnosis not present

## 2015-11-15 DIAGNOSIS — Z7901 Long term (current) use of anticoagulants: Secondary | ICD-10-CM | POA: Diagnosis not present

## 2015-11-15 DIAGNOSIS — G8929 Other chronic pain: Secondary | ICD-10-CM | POA: Diagnosis not present

## 2015-11-15 DIAGNOSIS — Z9981 Dependence on supplemental oxygen: Secondary | ICD-10-CM | POA: Diagnosis not present

## 2015-11-15 DIAGNOSIS — E119 Type 2 diabetes mellitus without complications: Secondary | ICD-10-CM | POA: Diagnosis not present

## 2015-11-15 DIAGNOSIS — C7951 Secondary malignant neoplasm of bone: Secondary | ICD-10-CM | POA: Diagnosis not present

## 2015-11-15 DIAGNOSIS — Z794 Long term (current) use of insulin: Secondary | ICD-10-CM | POA: Diagnosis not present

## 2015-11-15 DIAGNOSIS — C787 Secondary malignant neoplasm of liver and intrahepatic bile duct: Secondary | ICD-10-CM | POA: Diagnosis not present

## 2015-11-15 DIAGNOSIS — C7800 Secondary malignant neoplasm of unspecified lung: Secondary | ICD-10-CM | POA: Diagnosis not present

## 2015-11-16 DIAGNOSIS — C7889 Secondary malignant neoplasm of other digestive organs: Secondary | ICD-10-CM | POA: Diagnosis not present

## 2015-11-16 DIAGNOSIS — Z7901 Long term (current) use of anticoagulants: Secondary | ICD-10-CM | POA: Diagnosis not present

## 2015-11-16 DIAGNOSIS — G8929 Other chronic pain: Secondary | ICD-10-CM | POA: Diagnosis not present

## 2015-11-16 DIAGNOSIS — C787 Secondary malignant neoplasm of liver and intrahepatic bile duct: Secondary | ICD-10-CM | POA: Diagnosis not present

## 2015-11-16 DIAGNOSIS — C649 Malignant neoplasm of unspecified kidney, except renal pelvis: Secondary | ICD-10-CM | POA: Diagnosis not present

## 2015-11-16 DIAGNOSIS — I2699 Other pulmonary embolism without acute cor pulmonale: Secondary | ICD-10-CM | POA: Diagnosis not present

## 2015-11-16 DIAGNOSIS — E119 Type 2 diabetes mellitus without complications: Secondary | ICD-10-CM | POA: Diagnosis not present

## 2015-11-16 DIAGNOSIS — Z794 Long term (current) use of insulin: Secondary | ICD-10-CM | POA: Diagnosis not present

## 2015-11-16 DIAGNOSIS — C7951 Secondary malignant neoplasm of bone: Secondary | ICD-10-CM | POA: Diagnosis not present

## 2015-11-16 DIAGNOSIS — C7800 Secondary malignant neoplasm of unspecified lung: Secondary | ICD-10-CM | POA: Diagnosis not present

## 2015-11-16 DIAGNOSIS — Z9981 Dependence on supplemental oxygen: Secondary | ICD-10-CM | POA: Diagnosis not present

## 2015-11-16 LAB — CULTURE, BLOOD (ROUTINE X 2)
Culture: NO GROWTH
Culture: NO GROWTH

## 2015-11-19 ENCOUNTER — Telehealth: Payer: Self-pay | Admitting: *Deleted

## 2015-11-19 ENCOUNTER — Other Ambulatory Visit: Payer: Self-pay | Admitting: *Deleted

## 2015-11-19 DIAGNOSIS — C649 Malignant neoplasm of unspecified kidney, except renal pelvis: Secondary | ICD-10-CM

## 2015-11-19 DIAGNOSIS — K611 Rectal abscess: Secondary | ICD-10-CM

## 2015-11-19 DIAGNOSIS — K8689 Other specified diseases of pancreas: Secondary | ICD-10-CM

## 2015-11-19 DIAGNOSIS — F419 Anxiety disorder, unspecified: Secondary | ICD-10-CM

## 2015-11-19 DIAGNOSIS — C7951 Secondary malignant neoplasm of bone: Secondary | ICD-10-CM

## 2015-11-19 MED ORDER — ALPRAZOLAM 1 MG PO TABS: 1.0000 mg | ORAL_TABLET | Freq: Three times a day (TID) | ORAL | Status: DC | PRN

## 2015-11-19 MED ORDER — HYDROMORPHONE HCL 8 MG PO TABS: 8.0000 mg | ORAL_TABLET | ORAL | Status: DC | PRN

## 2015-11-19 NOTE — Progress Notes (Deleted)
°  Radiation Oncology         [phone] ________________________________  Name: Keith [name] MRN: [id number]  Date: 11/14/2015  DOB: December 10, 1966  End of Treatment Note    ICD-9-CM ICD-10-CM   1. Bone metastasis ([place]) 198.5 C79.51     DIAGNOSIS: [name] is a 49 yo gentleman with a 6 cm destructive left mid fibula metastases from renal cell carcinoma     Indication for treatment:  Palliation       Radiation treatment dates:  11/14/2015  Site/dose:   The metastasis was treated to 8 Gy in one fraction  Beams/energy:   Ant and Post fields were treated with 10 MV X-Rays  Narrative: The patient tolerated radiation treatment relatively well.     Plan: The patient has completed radiation treatment. The patient will return to radiation oncology clinic for routine followup in one month. I advised him to call or return sooner if he has any questions or concerns related to his recovery or treatment. ________________________________  Sheral Apley. Tammi Klippel, M.D.

## 2015-11-19 NOTE — Telephone Encounter (Signed)
Received call from pt stating that he needs a refill on his xanax & his pain pill.  He states he just got out of the hospital. Message routed to [name] & RN.  Pt states he can be reached at [phone].

## 2015-11-19 NOTE — Telephone Encounter (Signed)
This RN called and spoke with patient informing him that his prescriptions are ready for pick up. Patient verbalized understanding.

## 2015-11-20 DIAGNOSIS — C7800 Secondary malignant neoplasm of unspecified lung: Secondary | ICD-10-CM | POA: Diagnosis not present

## 2015-11-20 DIAGNOSIS — G8929 Other chronic pain: Secondary | ICD-10-CM | POA: Diagnosis not present

## 2015-11-20 DIAGNOSIS — D649 Anemia, unspecified: Secondary | ICD-10-CM | POA: Insufficient documentation

## 2015-11-20 DIAGNOSIS — E119 Type 2 diabetes mellitus without complications: Secondary | ICD-10-CM | POA: Diagnosis not present

## 2015-11-20 DIAGNOSIS — I2699 Other pulmonary embolism without acute cor pulmonale: Secondary | ICD-10-CM | POA: Insufficient documentation

## 2015-11-20 DIAGNOSIS — C7889 Secondary malignant neoplasm of other digestive organs: Secondary | ICD-10-CM | POA: Diagnosis not present

## 2015-11-20 DIAGNOSIS — C787 Secondary malignant neoplasm of liver and intrahepatic bile duct: Secondary | ICD-10-CM | POA: Diagnosis not present

## 2015-11-20 DIAGNOSIS — Z7901 Long term (current) use of anticoagulants: Secondary | ICD-10-CM | POA: Diagnosis not present

## 2015-11-20 DIAGNOSIS — Z794 Long term (current) use of insulin: Secondary | ICD-10-CM | POA: Diagnosis not present

## 2015-11-20 DIAGNOSIS — Z9981 Dependence on supplemental oxygen: Secondary | ICD-10-CM | POA: Diagnosis not present

## 2015-11-20 DIAGNOSIS — C7951 Secondary malignant neoplasm of bone: Secondary | ICD-10-CM | POA: Diagnosis not present

## 2015-11-20 DIAGNOSIS — C649 Malignant neoplasm of unspecified kidney, except renal pelvis: Secondary | ICD-10-CM | POA: Diagnosis not present

## 2015-11-23 DIAGNOSIS — C7951 Secondary malignant neoplasm of bone: Secondary | ICD-10-CM | POA: Diagnosis not present

## 2015-11-23 DIAGNOSIS — Z9981 Dependence on supplemental oxygen: Secondary | ICD-10-CM | POA: Diagnosis not present

## 2015-11-23 DIAGNOSIS — I2699 Other pulmonary embolism without acute cor pulmonale: Secondary | ICD-10-CM | POA: Diagnosis not present

## 2015-11-23 DIAGNOSIS — E119 Type 2 diabetes mellitus without complications: Secondary | ICD-10-CM | POA: Diagnosis not present

## 2015-11-23 DIAGNOSIS — C7800 Secondary malignant neoplasm of unspecified lung: Secondary | ICD-10-CM | POA: Diagnosis not present

## 2015-11-23 DIAGNOSIS — Z7901 Long term (current) use of anticoagulants: Secondary | ICD-10-CM | POA: Diagnosis not present

## 2015-11-23 DIAGNOSIS — G8929 Other chronic pain: Secondary | ICD-10-CM | POA: Diagnosis not present

## 2015-11-23 DIAGNOSIS — C649 Malignant neoplasm of unspecified kidney, except renal pelvis: Secondary | ICD-10-CM | POA: Diagnosis not present

## 2015-11-23 DIAGNOSIS — Z794 Long term (current) use of insulin: Secondary | ICD-10-CM | POA: Diagnosis not present

## 2015-11-23 DIAGNOSIS — C7889 Secondary malignant neoplasm of other digestive organs: Secondary | ICD-10-CM | POA: Diagnosis not present

## 2015-11-23 DIAGNOSIS — C787 Secondary malignant neoplasm of liver and intrahepatic bile duct: Secondary | ICD-10-CM | POA: Diagnosis not present

## 2015-11-24 IMAGING — CT CT ABD-PELV W/ CM
3 of 10 series · 13 of 46 positions shown, 20 images · IV contrast (OMNIPAQUE)
Comparison: none

[Series 6: venous · axial · portal-venous · 0.81mm/px · z∈[+724,+1258]mm · 9 of 224 slices shown, 15 images]
[im 23/224  soft-tissue]
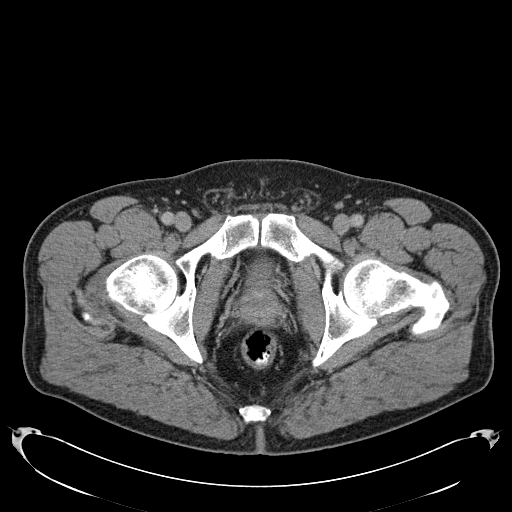
[im 23/224  bone]
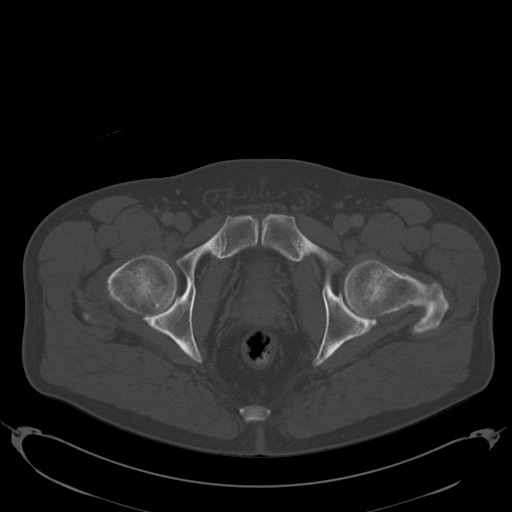
[im 45/224  soft-tissue]
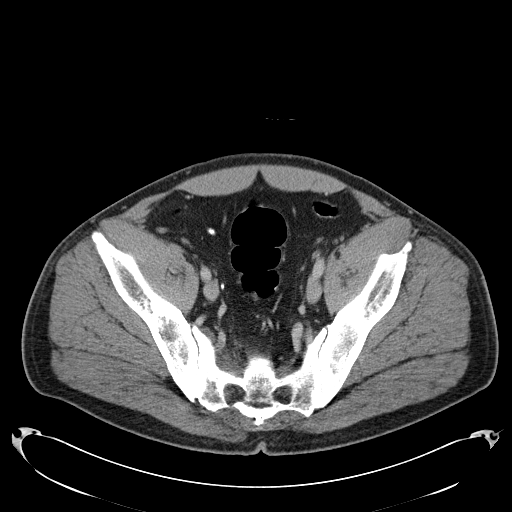
[im 67/224  soft-tissue]
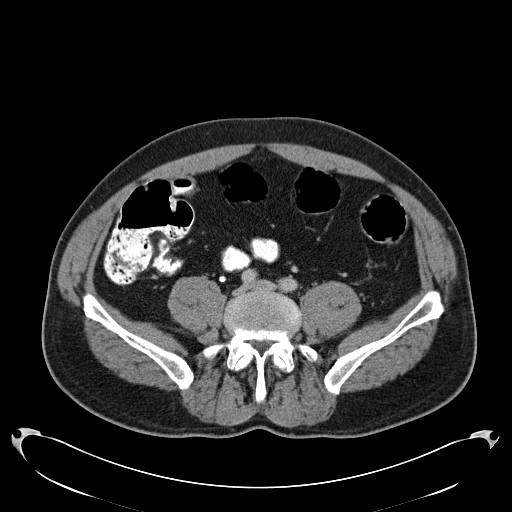
[im 90/224  soft-tissue]
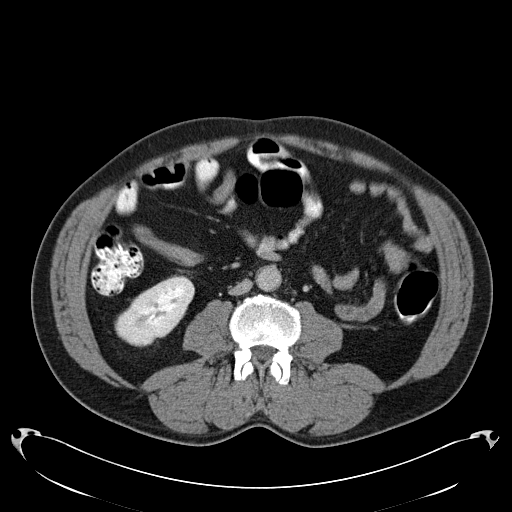
[im 112/224  soft-tissue]
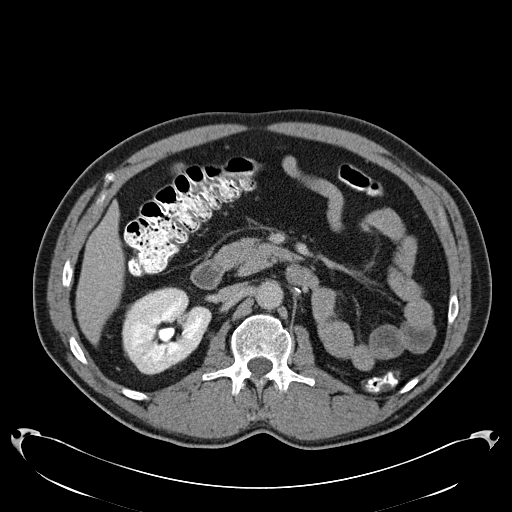
[im 134/224  soft-tissue]
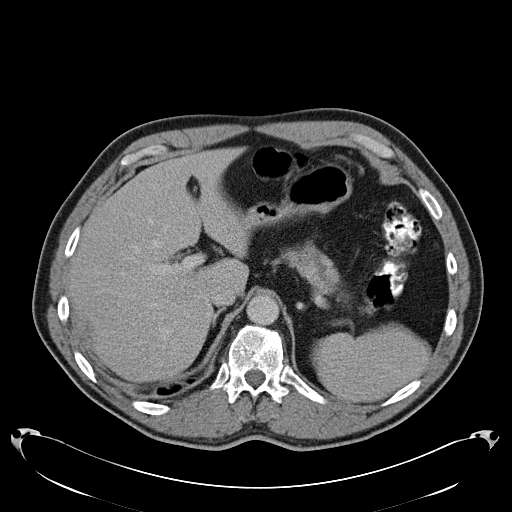
[im 134/224  lung]
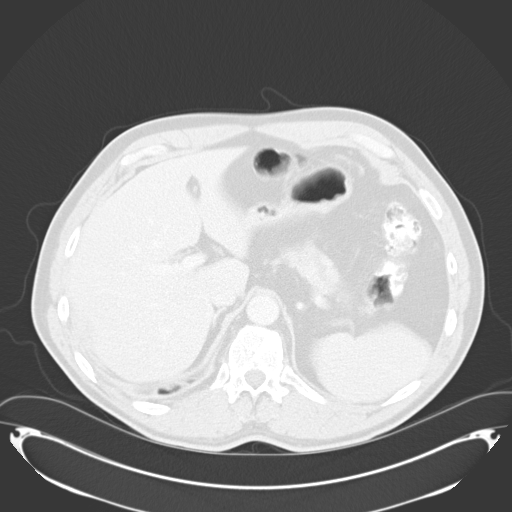
[im 157/224  soft-tissue]
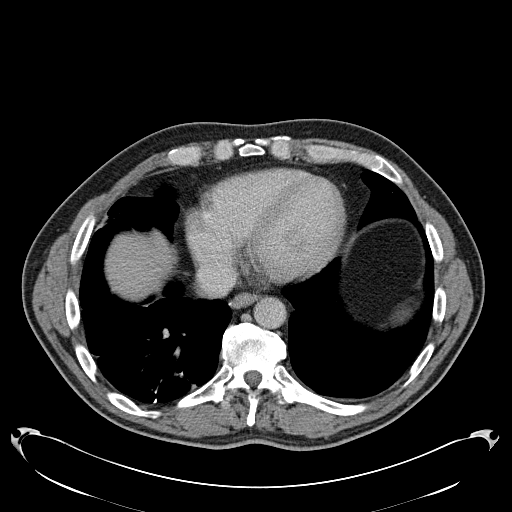
[im 157/224  lung]
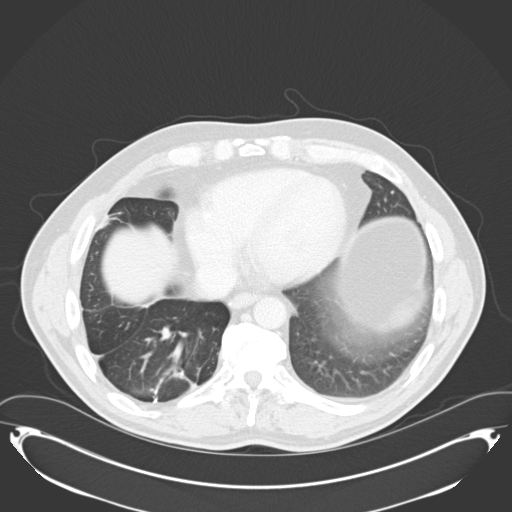
[im 179/224  soft-tissue]
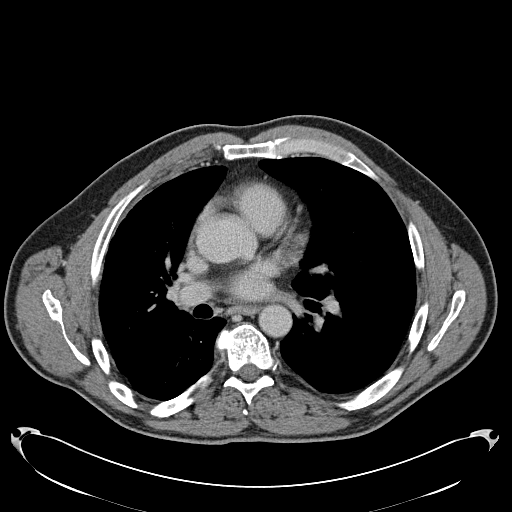
[im 179/224  lung]
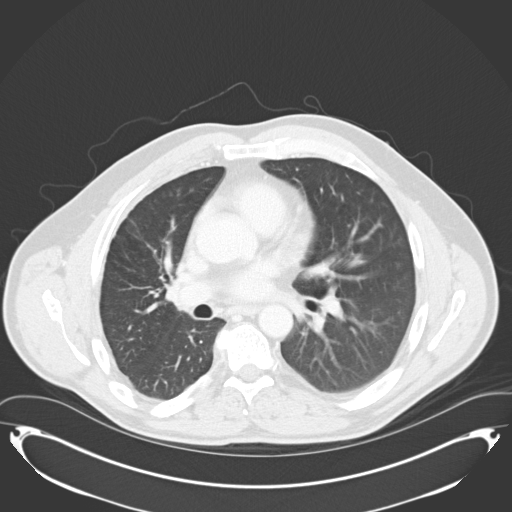
[im 201/224  soft-tissue]
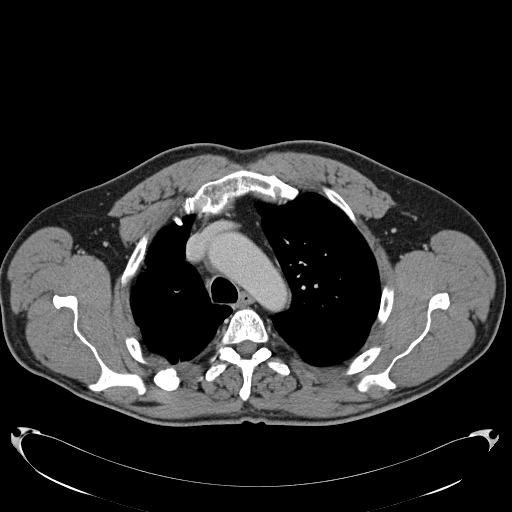
[im 201/224  lung]
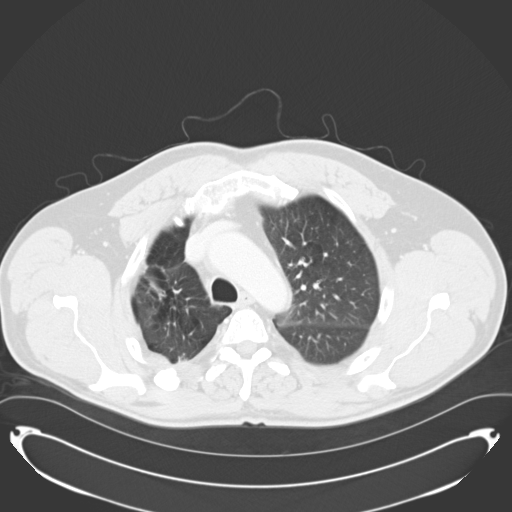
[im 201/224  bone]
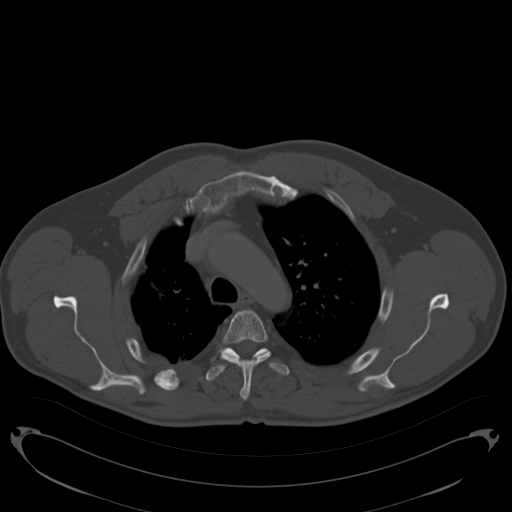

[Series 8: lung windows · axial · 0.81mm/px · z∈[+724,+790]mm · 2 of 224 slices shown]
[im 23/224  bone]
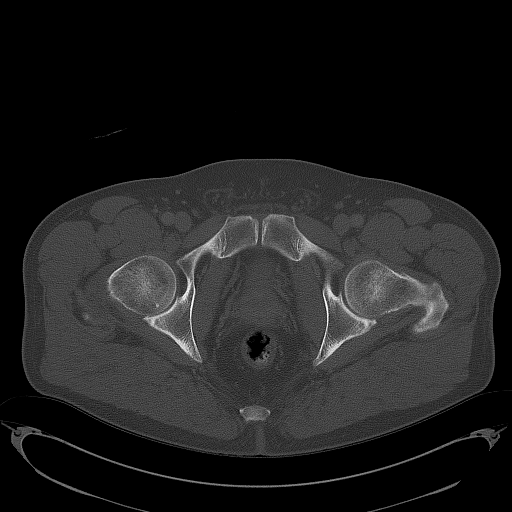
[im 45/224  bone]
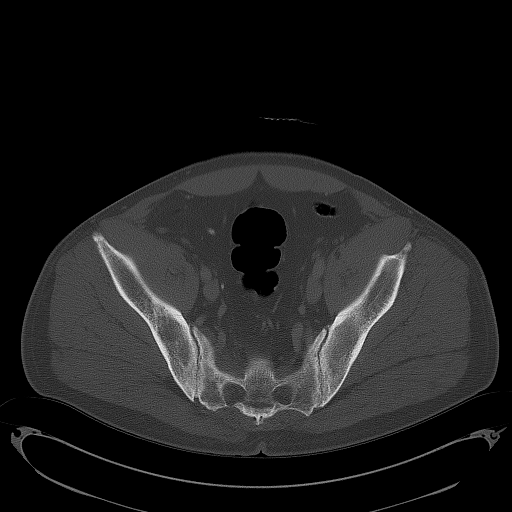

[Series 604: <mpr thick range(2)> · coronal · 0.81mm/px · 2 of 137 slices shown, 3 images]
[im 46/137  soft-tissue]
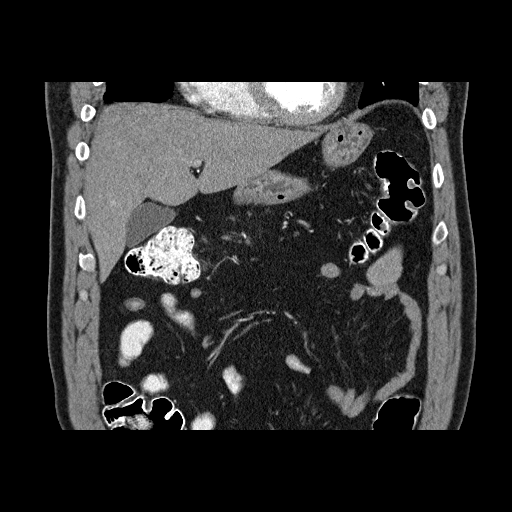
[im 46/137  bone]
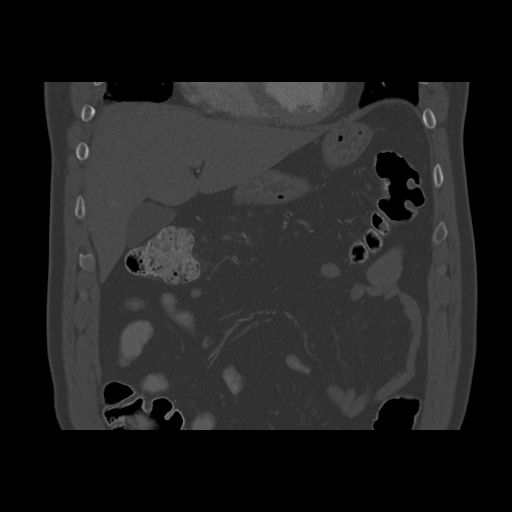
[im 91/137  soft-tissue]
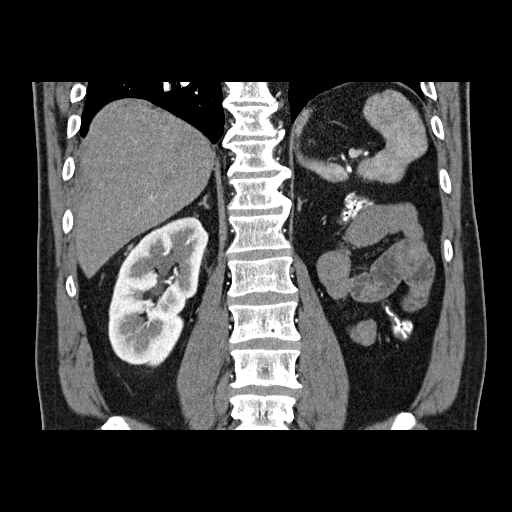

[13 of 46 positions shown; findings below may reference images not displayed]

Canned report from images found in remote index.

Refer to host system for actual result text.

## 2015-11-25 NOTE — Progress Notes (Signed)
  Radiation Oncology         [phone] ________________________________  Name: Keith [name] MRN: [id number]  Date: 11/14/2015  DOB: 07/21/1966  End of Treatment Note    ICD-9-CM ICD-10-CM   1. Bone metastasis ([place]) 198.5 C79.51     DIAGNOSIS: Mr. Dolecki is a 49 yo gentleman with a 6 cm destructive left mid fibula metastases from renal cell carcinoma     Indication for treatment:  Palliation       Radiation treatment dates:  11/14/2015  Site/dose:   The metastasis was treated to 8 Gy in one fraction  Beams/energy:   Ant and Post fields were treated with 10 MV X-Rays  Narrative: The patient tolerated radiation treatment relatively well.     Plan: The patient has completed radiation treatment. The patient will return to radiation oncology clinic for routine followup in one month. I advised him to call or return sooner if he has any questions or concerns related to his recovery or treatment. ________________________________  Sheral Apley. Tammi Klippel, M.D.

## 2015-11-26 ENCOUNTER — Other Ambulatory Visit: Payer: Self-pay | Admitting: *Deleted

## 2015-11-26 ENCOUNTER — Telehealth: Payer: Self-pay | Admitting: Oncology

## 2015-11-26 ENCOUNTER — Encounter: Payer: Self-pay | Admitting: *Deleted

## 2015-11-26 ENCOUNTER — Other Ambulatory Visit: Payer: Self-pay | Admitting: Oncology

## 2015-11-26 DIAGNOSIS — C7800 Secondary malignant neoplasm of unspecified lung: Secondary | ICD-10-CM | POA: Diagnosis not present

## 2015-11-26 DIAGNOSIS — C649 Malignant neoplasm of unspecified kidney, except renal pelvis: Secondary | ICD-10-CM

## 2015-11-26 DIAGNOSIS — Z794 Long term (current) use of insulin: Secondary | ICD-10-CM | POA: Diagnosis not present

## 2015-11-26 DIAGNOSIS — C7889 Secondary malignant neoplasm of other digestive organs: Secondary | ICD-10-CM | POA: Diagnosis not present

## 2015-11-26 DIAGNOSIS — Z7901 Long term (current) use of anticoagulants: Secondary | ICD-10-CM | POA: Diagnosis not present

## 2015-11-26 DIAGNOSIS — G8929 Other chronic pain: Secondary | ICD-10-CM | POA: Diagnosis not present

## 2015-11-26 DIAGNOSIS — Z9981 Dependence on supplemental oxygen: Secondary | ICD-10-CM | POA: Diagnosis not present

## 2015-11-26 DIAGNOSIS — E119 Type 2 diabetes mellitus without complications: Secondary | ICD-10-CM | POA: Diagnosis not present

## 2015-11-26 DIAGNOSIS — C7951 Secondary malignant neoplasm of bone: Secondary | ICD-10-CM | POA: Diagnosis not present

## 2015-11-26 DIAGNOSIS — C787 Secondary malignant neoplasm of liver and intrahepatic bile duct: Secondary | ICD-10-CM | POA: Diagnosis not present

## 2015-11-26 DIAGNOSIS — I2699 Other pulmonary embolism without acute cor pulmonale: Secondary | ICD-10-CM | POA: Diagnosis not present

## 2015-11-26 MED ORDER — CABOZANTINIB S-MALATE 60 MG PO TABS: 60.0000 mg | ORAL_TABLET | Freq: Every day | ORAL | Status: DC

## 2015-11-26 NOTE — Telephone Encounter (Signed)
s.w. pt and advised on 12.6 appt.Marland KitchenMarland KitchenMarland KitchenMarland Kitchenpt ok and aware

## 2015-11-27 ENCOUNTER — Ambulatory Visit (HOSPITAL_BASED_OUTPATIENT_CLINIC_OR_DEPARTMENT_OTHER): Payer: Medicare Other | Admitting: Oncology

## 2015-11-27 ENCOUNTER — Other Ambulatory Visit: Payer: Medicare Other

## 2015-11-27 ENCOUNTER — Telehealth: Payer: Self-pay | Admitting: Oncology

## 2015-11-27 VITALS — BP 122/88 | HR 82 | Temp 97.6°F | Resp 18 | Ht 75.0 in | Wt 187.8 lb

## 2015-11-27 DIAGNOSIS — C649 Malignant neoplasm of unspecified kidney, except renal pelvis: Secondary | ICD-10-CM | POA: Diagnosis not present

## 2015-11-27 DIAGNOSIS — M792 Neuralgia and neuritis, unspecified: Secondary | ICD-10-CM

## 2015-11-27 DIAGNOSIS — C7951 Secondary malignant neoplasm of bone: Secondary | ICD-10-CM

## 2015-11-27 DIAGNOSIS — R634 Abnormal weight loss: Secondary | ICD-10-CM

## 2015-11-27 DIAGNOSIS — C7889 Secondary malignant neoplasm of other digestive organs: Secondary | ICD-10-CM

## 2015-11-27 DIAGNOSIS — I2699 Other pulmonary embolism without acute cor pulmonale: Secondary | ICD-10-CM

## 2015-11-27 DIAGNOSIS — C7800 Secondary malignant neoplasm of unspecified lung: Secondary | ICD-10-CM

## 2015-11-27 DIAGNOSIS — I10 Essential (primary) hypertension: Secondary | ICD-10-CM

## 2015-11-27 MED ORDER — ENOXAPARIN SODIUM 150 MG/ML ~~LOC~~ SOLN: 140.0000 mg | SUBCUTANEOUS | Status: DC

## 2015-11-27 NOTE — Progress Notes (Signed)
Hematology and Oncology Follow Up Visit  Keith [name] [id number] 04-01-1966 49 y.o. 11/27/2015 9:52 AM  Keith Bring, MD  Keith [name], M.D.  Keith Bent, MD  Keith Banister, MD    Principle Diagnosis: This is a 48 year old gentleman with stage IV renal cell carcinoma diagnosed in 2009.  Prior Therapy: 1. Status post laparoscopic radical nephrectomy.  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  2. Patient status post thoracotomy for a synchronous metastatic lung lesions done October 2009.  He had a lower lobe nodule, biopsy proven to be metastatic renal cell carcinoma.   3. Patient is status post stereotactic radiotherapy to pulmonary nodules in May of 2010. 4. He is S/P Sutent 50 mg 4 weeks on 2 weeks off from 10/2010 to 03/2013. He progressed at that time.  5. He is S/P radiation to the right sacral bone between 4/22 to 4/30.  6. He is S/P XRT to the left shoulder 03/20/14 to 03/31/14. 7. Votrient 800 mg by mouth daily from 03/2013 through 06/22/2015. Discontinued secondary to disease progression. 8. Nivolumab 3 mg/kg given every 2 weeks started on 06/29/2015. He is status post 4 cycles completed 08/10/2015. He developed disease progression in September 2016.  9. Status post radiation therapy to the left mid fibula completed on 11/14/2015. He received a grade 1 fraction.    Current therapy:  Cabometyx 60 mg daily started in November 2016.   Interim History:  [name] presents today for a followup visit. Since his last visit, he was hospitalized on 2 separate occasions in the month of November. He was found to have a pulmonary embolism in the left lung and was discharged on Lovenox. He subsequently was hospitalized again between 11/19 and 11/23 for dyspnea on exertion and diagnosed with community-acquired pneumonia. Since his discharge, he has done much better and recovering slowly. He still quite debilitated but able to perform most activities of daily  living. He is ambulating without any difficulty but certainly not driving. He is breathing better but does require nighttime oxygen. He has not reported any cough or hemoptysis.  He tolerated Cabometyx without any recent complications. He denied any hand-foot syndrome, diarrhea, fatigue or other complications. His quality of life although diminished but remains reasonable.  He has palliative care service involved in his care which help provides him equipment such a hospital bed and oxygen.  His pain seems to be reasonably controlled and his left fibula is much better after radiation therapy. He does report left-sided flank pain and chest pain which is recovering from his recent PE and pneumonia.    He has not reported any nausea or vomiting. He did not report any headaches blurred vision or double vision. Does not report any seizure activity or alteration of mental status. Does not report any abdominal pain or hematochezia. Does not report any hematuria but does report occasional hesitancy and nocturia. He does not report any rashes or lesions or petechiae. He does not report any lymphadenopathy. His performance status and activity level remain relatively stable at this time. Remainder of his review of system is unremarkable.  Medications: I have reviewed the patient's current medications.  Current Outpatient Prescriptions  Medication Sig Dispense Refill  . ALPRAZolam (XANAX) 1 MG tablet Take 1 tablet (1 mg total) by mouth 3 (three) times daily as needed for anxiety. 100 tablet 0  . Cabozantinib S-Malate (CABOMETYX) 60 MG TABS Take 60 mg by mouth daily. 30 tablet 0  . calcium  carbonate (TUMS - DOSED IN MG ELEMENTAL CALCIUM) 500 MG chewable tablet Chew 1 tablet by mouth as needed for indigestion or heartburn.    . diphenhydrAMINE (BENADRYL) 25 mg capsule Take 25 mg by mouth every 6 (six) hours as needed for itching.    . DULoxetine (CYMBALTA) 60 MG capsule TAKE ONE CAPSULE BY MOUTH TWICE DAILY 60  capsule 1  . enoxaparin (LOVENOX) 150 MG/ML injection Inject 0.93 mLs (140 mg total) into the skin daily. 30 Syringe 1  . feeding supplement, ENSURE ENLIVE, (ENSURE ENLIVE) LIQD Take 237 mLs by mouth 2 (two) times daily between meals. 237 mL 12  . gabapentin (NEURONTIN) 300 MG capsule Take 1 capsule (300 mg total) by mouth 3 (three) times daily. 90 capsule 3  . HYDROmorphone (DILAUDID) 8 MG tablet Take 1 tablet (8 mg total) by mouth every 4 (four) hours as needed for moderate pain or severe pain. 50 tablet 0  . ibuprofen (ADVIL,MOTRIN) 200 MG tablet Take 400 mg by mouth every 6 (six) hours as needed for fever, headache, moderate pain or cramping.    . insulin glargine (LANTUS) 100 UNIT/ML injection Inject 26 Units into the skin at bedtime.    [name] lactulose (CHRONULAC) 10 GM/15ML solution Take 30 mLs (20 g total) by mouth 2 (two) times daily. 240 mL 0  . metoprolol tartrate (LOPRESSOR) 25 MG tablet Take 1 tablet (25 mg total) by mouth 2 (two) times daily. 60 tablet 0  . senna-docusate (SENOKOT-S) 8.6-50 MG tablet Take 1 tablet by mouth 2 (two) times daily. 30 tablet 0  . tiZANidine (ZANAFLEX) 4 MG tablet Take 4 mg by mouth every 8 (eight) hours as needed for muscle spasms.   0  . VIAGRA 100 MG tablet Take 50 mg by mouth as needed for erectile dysfunction.      No current facility-administered medications for this visit.     Allergies:  Allergies  Allergen Reactions  . Ceftriaxone Hives  . Hydrocodone Swelling    Past Medical History, Surgical history, Social history, and Family History were reviewed and updated.   Physical Exam: Blood pressure 122/88, pulse 82, temperature 97.6 F (36.4 C), temperature source Oral, resp. rate 18, height '6\' 3"'$  (1.905 m), weight 187 lb 12.8 oz (85.186 kg), SpO2 99 %. ECOG: 1 General appearance: Chronically ill-appearing gentleman but appeared comfortable without distress today. Head: Normocephalic, without obvious abnormality. No oral ulcers or  lesions. Neck: no adenopathy, no masses.  Lymph nodes: Cervical, supraclavicular, and axillary nodes normal. Heart:regular rate and rhythm, S1, S2.  Lung: Clear throughout auscultation. No wheezes or dullness to percussion. Abdomen: soft, non-tender, without masses or organomegaly no shifting dullness or ascites.  EXT:. No edema noted. His protrusion on the left tibia have decreased. Neuro: no focal deficits noted. No weakness noted in his lower extremities.  Lab Results: Lab Results  Component Value Date   WBC 4.5 11/12/2015   HGB 11.5* 11/12/2015   HCT 36.0* 11/12/2015   MCV 94.0 11/12/2015   PLT 115* 11/12/2015     Impression and Plan:  This is a pleasant 49 year old gentleman with the following issues. 1. Metastatic renal cell carcinoma.  He has documented disease to the lung and the pancreas. He is status post multiple therapies outlined above and recently progressed in October 2016. He is currently on Cabometyx and have tolerated it well. The plan is to continue on the same dose and schedule and repeat imaging studies in 2 months. He understands the goal of therapy is  palliative and not curative. His prognosis is relatively poor but would like to continue aggressive therapy for the time being. His performance status is not prohibitive at this time and I agree with his assessment.  2. Sacral metastasis: Patient seems to be reasonably controlled. 3. Depression/Anxiety: He remains on Xanax and Cymbalta. His mood is reasonably well at this time. Psychosocial support services have been offered to the patient and he declined. He denied homicidal and suicidal ideation and declined to talk to a therapist.   4. Weight loss: improved at this time with a reasonable appetite. His weight remains down but I anticipate this will improve as he is recovering from his recent illness. 5. Neuropathic pain. He is on gabapentin and Dilaudid for pain. He also has a subcutaneous pain medication pump. His  pain is under reasonable control except for his left leg . 6. Left lower extremity pain and a mass: Much improved after radiation therapy. 7. Hypertension: Controlled at this time. This will need to be monitored moving followed. 8. Pulmonary embolism: He is currently on Lovenox and has no problem with given himself injections. I anticipate that he will require lifetime anticoagulation. 9. Prognosis: I continue to emphasize the fact that he has an incurable malignancy and he has had stage IV disease since 2009. I do believe that his cancer status have worsened dramatically but he would like to continue to be aggressive at this time.  10. Hyperglycemia: Managed by his primary care physician at this time. 11. Follow up: in 4 weeks to follow-up on his clinical progress and determine any complications of therapy.  [facility], MD 11/27/2015

## 2015-11-27 NOTE — Progress Notes (Signed)
lovenox script given to [facility] in managed care for assistance

## 2015-11-27 NOTE — Telephone Encounter (Signed)
s.w. pt and advised on Jan 2017 appt.Marland KitchenMarland KitchenMarland KitchenMarland Kitchenpt ok and aware

## 2015-11-28 DIAGNOSIS — C7951 Secondary malignant neoplasm of bone: Secondary | ICD-10-CM | POA: Diagnosis not present

## 2015-11-28 DIAGNOSIS — C7800 Secondary malignant neoplasm of unspecified lung: Secondary | ICD-10-CM | POA: Diagnosis not present

## 2015-11-28 DIAGNOSIS — Z794 Long term (current) use of insulin: Secondary | ICD-10-CM | POA: Diagnosis not present

## 2015-11-28 DIAGNOSIS — C787 Secondary malignant neoplasm of liver and intrahepatic bile duct: Secondary | ICD-10-CM | POA: Diagnosis not present

## 2015-11-28 DIAGNOSIS — C649 Malignant neoplasm of unspecified kidney, except renal pelvis: Secondary | ICD-10-CM | POA: Diagnosis not present

## 2015-11-28 DIAGNOSIS — G8929 Other chronic pain: Secondary | ICD-10-CM | POA: Diagnosis not present

## 2015-11-28 DIAGNOSIS — Z9981 Dependence on supplemental oxygen: Secondary | ICD-10-CM | POA: Diagnosis not present

## 2015-11-28 DIAGNOSIS — Z7901 Long term (current) use of anticoagulants: Secondary | ICD-10-CM | POA: Diagnosis not present

## 2015-11-28 DIAGNOSIS — C7889 Secondary malignant neoplasm of other digestive organs: Secondary | ICD-10-CM | POA: Diagnosis not present

## 2015-11-28 DIAGNOSIS — I2699 Other pulmonary embolism without acute cor pulmonale: Secondary | ICD-10-CM | POA: Diagnosis not present

## 2015-11-28 DIAGNOSIS — E119 Type 2 diabetes mellitus without complications: Secondary | ICD-10-CM | POA: Diagnosis not present

## 2015-11-29 DIAGNOSIS — Z7901 Long term (current) use of anticoagulants: Secondary | ICD-10-CM | POA: Diagnosis not present

## 2015-11-29 DIAGNOSIS — Z794 Long term (current) use of insulin: Secondary | ICD-10-CM | POA: Diagnosis not present

## 2015-11-29 DIAGNOSIS — C7800 Secondary malignant neoplasm of unspecified lung: Secondary | ICD-10-CM | POA: Diagnosis not present

## 2015-11-29 DIAGNOSIS — G8929 Other chronic pain: Secondary | ICD-10-CM | POA: Diagnosis not present

## 2015-11-29 DIAGNOSIS — C649 Malignant neoplasm of unspecified kidney, except renal pelvis: Secondary | ICD-10-CM | POA: Diagnosis not present

## 2015-11-29 DIAGNOSIS — C7889 Secondary malignant neoplasm of other digestive organs: Secondary | ICD-10-CM | POA: Diagnosis not present

## 2015-11-29 DIAGNOSIS — C7951 Secondary malignant neoplasm of bone: Secondary | ICD-10-CM | POA: Diagnosis not present

## 2015-11-29 DIAGNOSIS — C787 Secondary malignant neoplasm of liver and intrahepatic bile duct: Secondary | ICD-10-CM | POA: Diagnosis not present

## 2015-11-29 DIAGNOSIS — I2699 Other pulmonary embolism without acute cor pulmonale: Secondary | ICD-10-CM | POA: Diagnosis not present

## 2015-11-29 DIAGNOSIS — E119 Type 2 diabetes mellitus without complications: Secondary | ICD-10-CM | POA: Diagnosis not present

## 2015-11-29 DIAGNOSIS — Z9981 Dependence on supplemental oxygen: Secondary | ICD-10-CM | POA: Diagnosis not present

## 2015-12-11 ENCOUNTER — Other Ambulatory Visit: Payer: Self-pay | Admitting: *Deleted

## 2015-12-11 DIAGNOSIS — I2699 Other pulmonary embolism without acute cor pulmonale: Secondary | ICD-10-CM

## 2015-12-11 MED ORDER — ENOXAPARIN SODIUM 150 MG/ML ~~LOC~~ SOLN: 140.0000 mg | SUBCUTANEOUS | Status: DC

## 2015-12-11 NOTE — Telephone Encounter (Signed)
"  I need a refill on the E-N-[id number]-A-[id number]-I-N.   I have four left.  [facility] is the only pharmacy that has it."

## 2015-12-14 DIAGNOSIS — C7800 Secondary malignant neoplasm of unspecified lung: Secondary | ICD-10-CM | POA: Diagnosis not present

## 2015-12-16 DIAGNOSIS — C7800 Secondary malignant neoplasm of unspecified lung: Secondary | ICD-10-CM | POA: Diagnosis not present

## 2015-12-18 ENCOUNTER — Other Ambulatory Visit: Payer: Self-pay | Admitting: *Deleted

## 2015-12-18 DIAGNOSIS — C7951 Secondary malignant neoplasm of bone: Secondary | ICD-10-CM

## 2015-12-18 DIAGNOSIS — C7800 Secondary malignant neoplasm of unspecified lung: Secondary | ICD-10-CM

## 2015-12-18 DIAGNOSIS — C649 Malignant neoplasm of unspecified kidney, except renal pelvis: Secondary | ICD-10-CM

## 2015-12-18 MED ORDER — CABOZANTINIB S-MALATE 60 MG PO TABS: 60.0000 mg | ORAL_TABLET | Freq: Every day | ORAL | Status: DC

## 2015-12-25 ENCOUNTER — Ambulatory Visit (HOSPITAL_BASED_OUTPATIENT_CLINIC_OR_DEPARTMENT_OTHER): Payer: Medicare Other | Admitting: Oncology

## 2015-12-25 ENCOUNTER — Other Ambulatory Visit (HOSPITAL_BASED_OUTPATIENT_CLINIC_OR_DEPARTMENT_OTHER): Payer: Medicare Other

## 2015-12-25 VITALS — BP 143/96 | HR 89 | Temp 97.6°F | Resp 18 | Ht 75.0 in | Wt 191.2 lb

## 2015-12-25 DIAGNOSIS — C7889 Secondary malignant neoplasm of other digestive organs: Secondary | ICD-10-CM

## 2015-12-25 DIAGNOSIS — C7951 Secondary malignant neoplasm of bone: Secondary | ICD-10-CM

## 2015-12-25 DIAGNOSIS — C649 Malignant neoplasm of unspecified kidney, except renal pelvis: Secondary | ICD-10-CM | POA: Diagnosis not present

## 2015-12-25 DIAGNOSIS — F419 Anxiety disorder, unspecified: Secondary | ICD-10-CM

## 2015-12-25 DIAGNOSIS — I2699 Other pulmonary embolism without acute cor pulmonale: Secondary | ICD-10-CM

## 2015-12-25 DIAGNOSIS — M792 Neuralgia and neuritis, unspecified: Secondary | ICD-10-CM

## 2015-12-25 DIAGNOSIS — I10 Essential (primary) hypertension: Secondary | ICD-10-CM

## 2015-12-25 DIAGNOSIS — K611 Rectal abscess: Secondary | ICD-10-CM

## 2015-12-25 DIAGNOSIS — C7800 Secondary malignant neoplasm of unspecified lung: Secondary | ICD-10-CM | POA: Diagnosis not present

## 2015-12-25 DIAGNOSIS — K8689 Other specified diseases of pancreas: Secondary | ICD-10-CM

## 2015-12-25 LAB — CBC WITH DIFFERENTIAL/PLATELET
BASO%: 0.8 % (ref 0.0–2.0)
Basophils Absolute: 0 10*3/uL (ref 0.0–0.1)
EOS%: 6.2 % (ref 0.0–7.0)
Eosinophils Absolute: 0.3 10*3/uL (ref 0.0–0.5)
HCT: 38.5 % (ref 38.4–49.9)
HGB: 12.7 g/dL — ABNORMAL LOW (ref 13.0–17.1)
LYMPH%: 34.5 % (ref 14.0–49.0)
MCH: 30.9 pg (ref 27.2–33.4)
MCHC: 32.9 g/dL (ref 32.0–36.0)
MCV: 94 fL (ref 79.3–98.0)
MONO#: 0.3 10*3/uL (ref 0.1–0.9)
MONO%: 5.4 % (ref 0.0–14.0)
NEUT#: 2.9 10*3/uL (ref 1.5–6.5)
NEUT%: 53.1 % (ref 39.0–75.0)
Platelets: 207 10*3/uL (ref 140–400)
RBC: 4.1 10*6/uL — ABNORMAL LOW (ref 4.20–5.82)
RDW: 19.8 % — ABNORMAL HIGH (ref 11.0–14.6)
WBC: 5.5 10*3/uL (ref 4.0–10.3)
lymph#: 1.9 10*3/uL (ref 0.9–3.3)

## 2015-12-25 LAB — COMPREHENSIVE METABOLIC PANEL
ALT: 45 U/L (ref 0–55)
AST: 28 U/L (ref 5–34)
Albumin: 3.3 g/dL — ABNORMAL LOW (ref 3.5–5.0)
Alkaline Phosphatase: 94 U/L (ref 40–150)
Anion Gap: 7 mEq/L (ref 3–11)
BUN: 6.1 mg/dL — ABNORMAL LOW (ref 7.0–26.0)
CO2: 27 mEq/L (ref 22–29)
Calcium: 8.8 mg/dL (ref 8.4–10.4)
Chloride: 106 mEq/L (ref 98–109)
Creatinine: 0.9 mg/dL (ref 0.7–1.3)
EGFR: >90 mL/min/{1.73_m2} (ref >90)
Glucose: 109 mg/dl (ref 70–140)
Potassium: 4.1 mEq/L (ref 3.5–5.1)
Sodium: 140 mEq/L (ref 136–145)
Total Bilirubin: <0.3 mg/dL (ref 0.20–1.20)
Total Protein: 7.5 g/dL (ref 6.4–8.3)

## 2015-12-25 MED ORDER — ALPRAZOLAM 1 MG PO TABS: 1.0000 mg | ORAL_TABLET | Freq: Three times a day (TID) | ORAL | Status: DC | PRN

## 2015-12-25 NOTE — Progress Notes (Signed)
Hematology and Oncology Follow Up Visit  Keith [name] [id number] May 21, 1966 50 y.o. 12/25/2015 2:35 PM  Keith Bring, MD  Keith [name], M.D.  Keith Bent, MD  Keith Banister, MD    Principle Diagnosis: This is a 50 year old gentleman with stage IV renal cell carcinoma diagnosed in 2009.  Prior Therapy: 1. Status post laparoscopic radical nephrectomy.  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  2. Patient status post thoracotomy for a synchronous metastatic lung lesions done October 2009.  He had a lower lobe nodule, biopsy proven to be metastatic renal cell carcinoma.   3. Patient is status post stereotactic radiotherapy to pulmonary nodules in May of 2010. 4. He is S/P Sutent 50 mg 4 weeks on 2 weeks off from 10/2010 to 03/2013. He progressed at that time.  5. He is S/P radiation to the right sacral bone between 4/22 to 4/30.  6. He is S/P XRT to the left shoulder 03/20/14 to 03/31/14. 7. Votrient 800 mg by mouth daily from 03/2013 through 06/22/2015. Discontinued secondary to disease progression. 8. Nivolumab 3 mg/kg given every 2 weeks started on 06/29/2015. He is status post 4 cycles completed 08/10/2015. He developed disease progression in September 2016.  9. Status post radiation therapy to the left mid fibula completed on 11/14/2015. He received a grade 1 fraction.    Current therapy:  Cabometyx 60 mg daily started in November 2016.   Interim History:  Keith [name] presents today for a followup visit. Since his last visit, he reports feeling much better. He has recovered from his recent hospitalizations in November 2016. His respiratory status is back to baseline without any dyspnea or shortness of breath. He continues to eat better and have gained weight. He is ambulating without any difficulty and driving short distances. He does report shortness of breath if he walks for an extended period of time. He does need some assistance of a cane or a person  in his mobility is limited related to that. He has not reported any cough or hemoptysis. He continues to have chronic pain associated with his left lower extremity and has improved after radiation therapy.  He tolerated Cabometyx without any recent complications. He denied any hand-foot syndrome, fatigue or other complications. His quality of life is improving at this time. He does report grade 1 diarrhea that is manageable.   He has not reported any nausea or vomiting. He did not report any headaches blurred vision or double vision. Does not report any seizure activity or alteration of mental status. Does not report any abdominal pain or hematochezia. Does not report any hematuria but does report occasional hesitancy and nocturia. He does not report any rashes or lesions or petechiae. He does not report any lymphadenopathy. His performance status and activity level remain relatively stable at this time. Remainder of his review of system is unremarkable.  Medications: I have reviewed the patient's current medications.  Current Outpatient Prescriptions  Medication Sig Dispense Refill  . ALPRAZolam (XANAX) 1 MG tablet Take 1 tablet (1 mg total) by mouth 3 (three) times daily as needed for anxiety. 100 tablet 0  . Cabozantinib S-Malate (CABOMETYX) 60 MG TABS Take 60 mg by mouth daily. 30 tablet 0  . calcium carbonate (TUMS - DOSED IN MG ELEMENTAL CALCIUM) 500 MG chewable tablet Chew 1 tablet by mouth as needed for indigestion or heartburn.    . diphenhydrAMINE (BENADRYL) 25 mg capsule Take 25 mg by mouth every 6 (six)  hours as needed for itching.    . DULoxetine (CYMBALTA) 60 MG capsule TAKE ONE CAPSULE BY MOUTH TWICE DAILY 60 capsule 1  . enoxaparin (LOVENOX) 150 MG/ML injection Inject 0.93 mLs (140 mg total) into the skin daily. 30 Syringe 1  . feeding supplement, ENSURE ENLIVE, (ENSURE ENLIVE) LIQD Take 237 mLs by mouth 2 (two) times daily between meals. 237 mL 12  . gabapentin (NEURONTIN) 300 MG  capsule Take 1 capsule (300 mg total) by mouth 3 (three) times daily. 90 capsule 3  . HYDROmorphone (DILAUDID) 8 MG tablet Take 1 tablet (8 mg total) by mouth every 4 (four) hours as needed for moderate pain or severe pain. 50 tablet 0  . ibuprofen (ADVIL,MOTRIN) 200 MG tablet Take 400 mg by mouth every 6 (six) hours as needed for fever, headache, moderate pain or cramping.    . insulin glargine (LANTUS) 100 UNIT/ML injection Inject 26 Units into the skin at bedtime.    [name] lactulose (CHRONULAC) 10 GM/15ML solution Take 30 mLs (20 g total) by mouth 2 (two) times daily. 240 mL 0  . metoprolol tartrate (LOPRESSOR) 25 MG tablet Take 1 tablet (25 mg total) by mouth 2 (two) times daily. 60 tablet 0  . senna-docusate (SENOKOT-S) 8.6-50 MG tablet Take 1 tablet by mouth 2 (two) times daily. 30 tablet 0  . tiZANidine (ZANAFLEX) 4 MG tablet Take 4 mg by mouth every 8 (eight) hours as needed for muscle spasms.   0  . VIAGRA 100 MG tablet Take 50 mg by mouth as needed for erectile dysfunction.      No current facility-administered medications for this visit.     Allergies:  Allergies  Allergen Reactions  . Ceftriaxone Hives  . Hydrocodone Swelling    Past Medical History, Surgical history, Social history, and Family History were reviewed and updated.   Physical Exam: Blood pressure 143/96, pulse 89, temperature 97.6 F (36.4 C), temperature source Oral, resp. rate 18, height '6\' 3"'$  (1.905 m), weight 191 lb 3.2 oz (86.728 kg), SpO2 98 %. ECOG: 1 General appearance: Chronically ill-appearing gentleman without distress today. His mood is much improved.  Head: Normocephalic, without obvious abnormality. No oral thrush. Neck: no adenopathy, no masses.  Lymph nodes: Cervical, supraclavicular, and axillary nodes normal. Heart:regular rate and rhythm, S1, S2.  Lung: Clear throughout auscultation. No wheezes noted. Abdomen: soft, non-tender, without masses or organomegaly no shifting dullness or ascites.   EXT:. No edema noted. His protrusion on the left tibia have decreased. Tender to touch. Neuro: no focal deficits noted. No weakness noted in his lower extremities.  Lab Results: Lab Results  Component Value Date   WBC 5.5 12/25/2015   HGB 12.7* 12/25/2015   HCT 38.5 12/25/2015   MCV 94.0 12/25/2015   PLT 207 12/25/2015     Impression and Plan:  This is a pleasant 50 year old gentleman with the following issues. 1. Metastatic renal cell carcinoma.  He has documented disease to the lung and the pancreas. He is status post multiple therapies outlined above and recently progressed in October 2016. He is currently on Cabometyx and have tolerated it well. The plan is to continue on the same dose and schedule and repeat imaging studies in one months. He understands the goal of therapy is palliative and not curative. I believe his quality of life has improved on this medication for the time being..  2. Sacral metastasis: Pain seems to be reasonably controlled. 3. Depression/Anxiety: He remains on Xanax and Cymbalta. His mood  is much improved. I have refilled his Xanax at this time.   4. Weight loss: improved at this time with a reasonable appetite. His weight has increased since the last visit 5. Neuropathic pain. He is on gabapentin and Dilaudid for pain. He also has a subcutaneous pain medication pump. His pain is controlled for now. 6. Left lower extremity pain and a mass: Much improved after radiation therapy. 7. Hypertension: Controlled at this time. This will need to be monitored moving followed. 8. Pulmonary embolism: He is currently on Lovenox and has no problem with given himself injections. I anticipate that he will require lifetime anticoagulation. 9. Prognosis: I continue to emphasize the fact that he has an incurable malignancy and he has had stage IV disease since 2009. Any treatment is palliative at this time but his performance status is reasonable and would like to continue  aggressive therapy for further time being. 10. Hyperglycemia: Managed by his primary care physician at this time. 11. Follow up: in 4 weeks after a repeat CT scan.  [facility], MD 12/25/2015

## 2015-12-25 NOTE — Progress Notes (Signed)
Patient given form for handicap car plackard

## 2015-12-26 ENCOUNTER — Telehealth: Payer: Self-pay | Admitting: Oncology

## 2015-12-26 ENCOUNTER — Encounter: Payer: Self-pay | Admitting: Pharmacist

## 2015-12-26 NOTE — Progress Notes (Signed)
..  Oral Chemotherapy Follow-Up Form  Original Start date of oral chemotherapy: _10/23/2016_   Called patient today to follow up regarding patient's oral chemotherapy medication: _Cabometyx___  Pt is doing well today with no complaints. No missed doses or side effects reported. Energy and appetite levels are good. He is doing well on the Cabometyx.  Pt reports _0___ tablets/doses missed in the last week/month.     Will follow up and call patient again in __1 month___   Thank you,  Montel Clock, PharmD, [facility]

## 2015-12-26 NOTE — Telephone Encounter (Signed)
Left message for patient re February appointments and mailed schedule. [facility] will call patient re ct.

## 2015-12-27 DIAGNOSIS — Z9689 Presence of other specified functional implants: Secondary | ICD-10-CM | POA: Diagnosis not present

## 2015-12-27 DIAGNOSIS — M25551 Pain in right hip: Secondary | ICD-10-CM | POA: Diagnosis not present

## 2015-12-27 DIAGNOSIS — M5417 Radiculopathy, lumbosacral region: Secondary | ICD-10-CM | POA: Diagnosis not present

## 2016-01-01 IMAGING — MR MR HUMERUS*L* WO/W CM
4 of 7 series · 19 of 40 positions shown · IV contrast (multihance)
Comparison: MRI dated 11/29/2013

CLINICAL DATA: Continuous left arm pain. Metastatic renal cell
carcinoma. Lesion in the proximal humerus seen on MRI dated
11/29/2013

EXAM:
MRI OF THE LEFT HUMERUS WITHOUT AND WITH CONTRAST
TECHNIQUE: Multiplanar, multisequence MR imaging was performed both before and
after administration of intravenous contrast.
CONTRAST:  20mL MULTIHANCE GADOBENATE DIMEGLUMINE 529 MG/ML IV SOLN

[Series 6: T1 · coronal · 4.0mm · 0.74mm/px · 4 of 28 slices shown (1 of 2)]
[im 1/28]
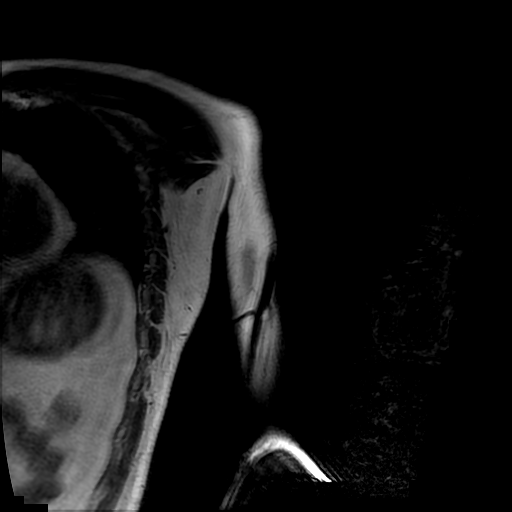
[im 10/28]
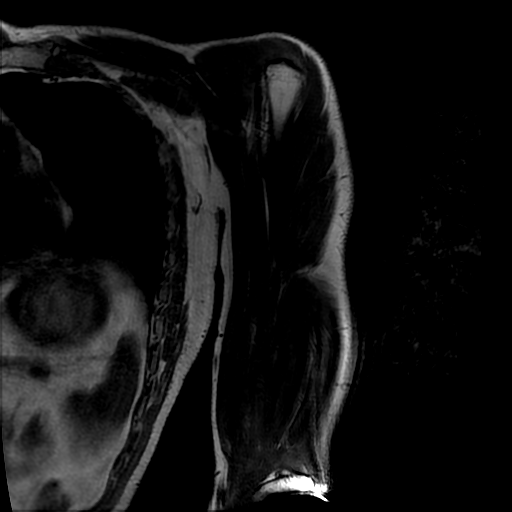
[im 19/28]
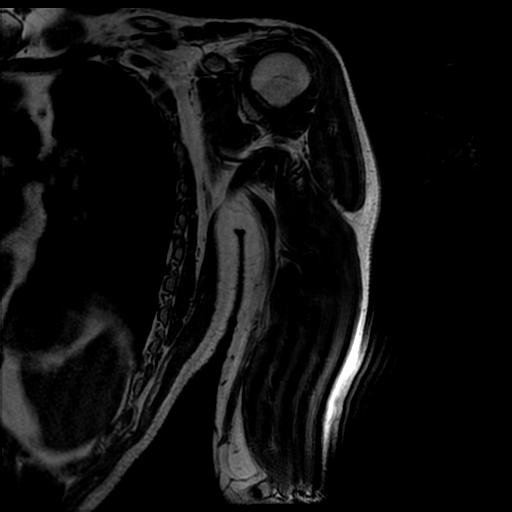
[im 28/28]
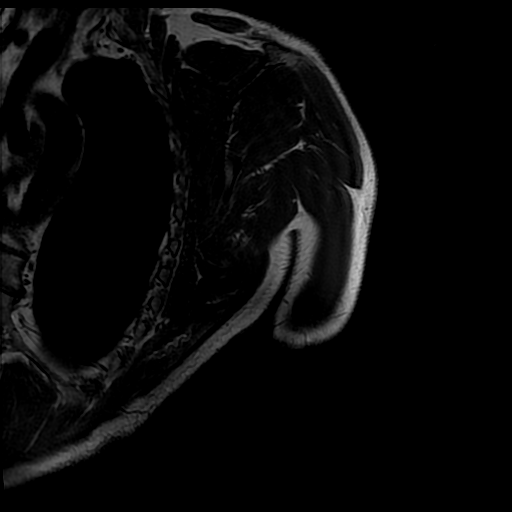

[Series 8: T1 · axial · 5.0mm · 0.43mm/px · z∈[-201,+33]mm · 5 of 50 slices shown (2 of 2)]
[im 1/50]
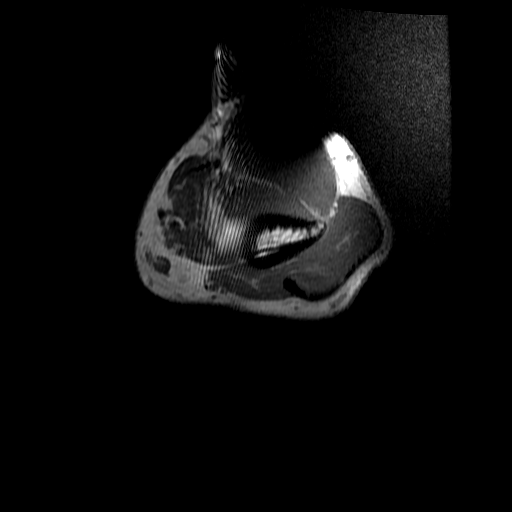
[im 9/50]
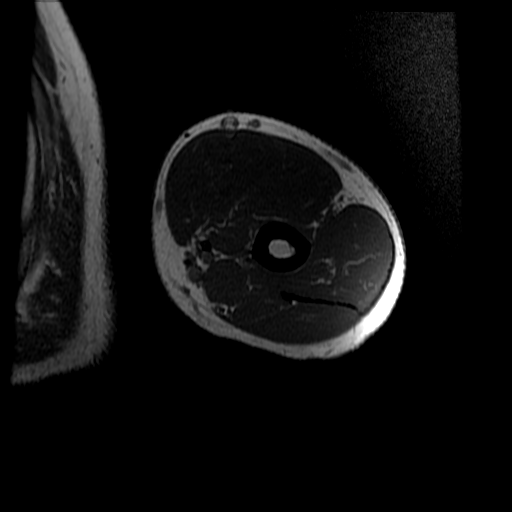
[im 17/50]
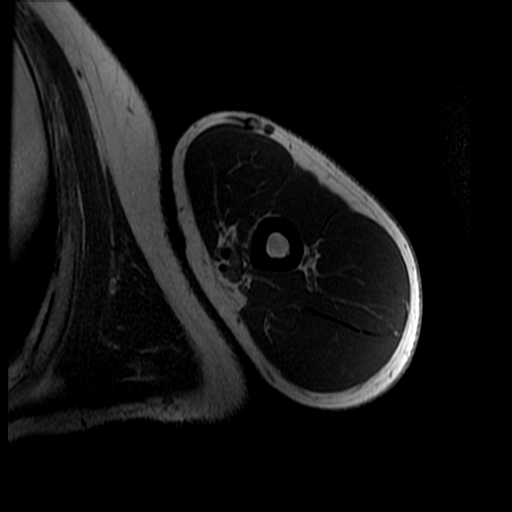
[im 25/50]
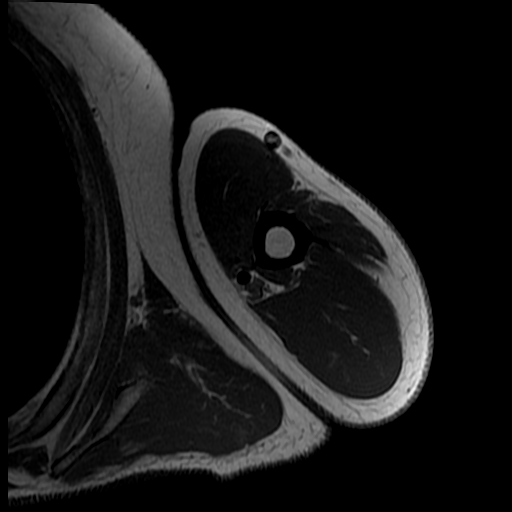
[im 41/50]
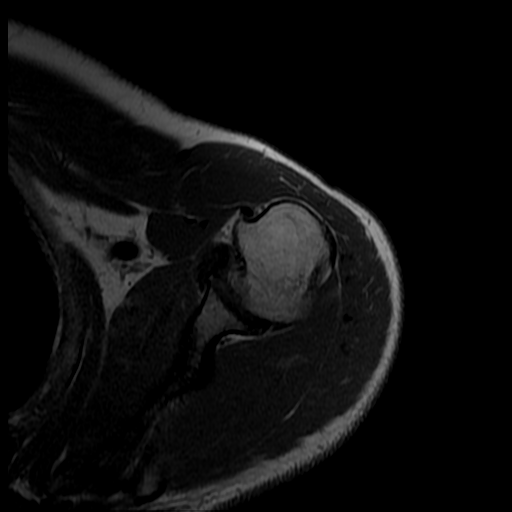

[Series 9: T1 fat-sat · axial · 5.0mm · 0.43mm/px · z∈[-154,+33]mm · 3 of 50 slices shown]
[im 9/50]
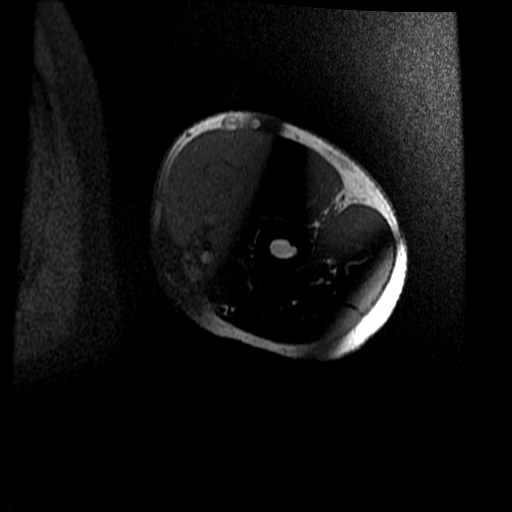
[im 25/50]
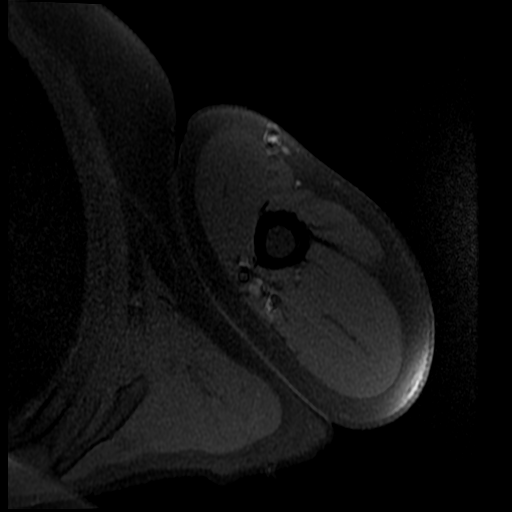
[im 41/50]
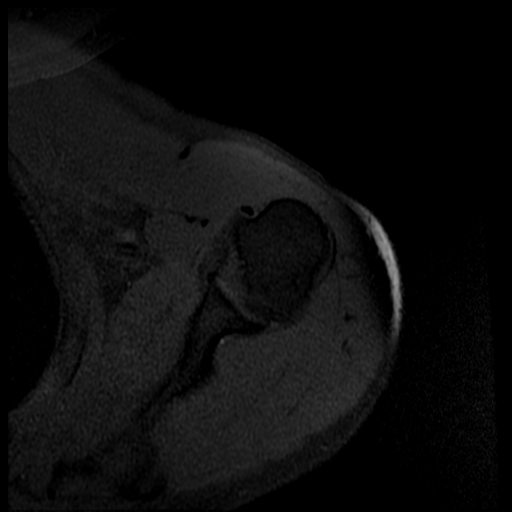

[Series 10: T2 fat-sat · axial · 5.0mm · 0.43mm/px · z∈[-201,+85]mm · 7 of 50 slices shown]
[im 1/50]
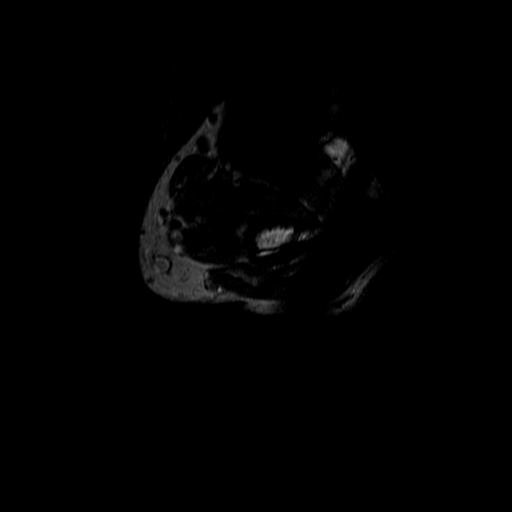
[im 9/50]
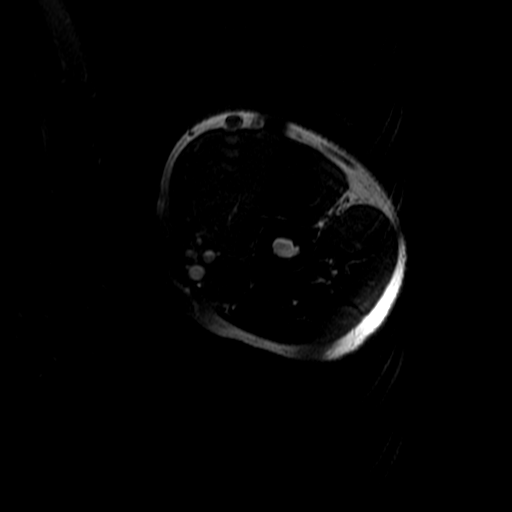
[im 17/50]
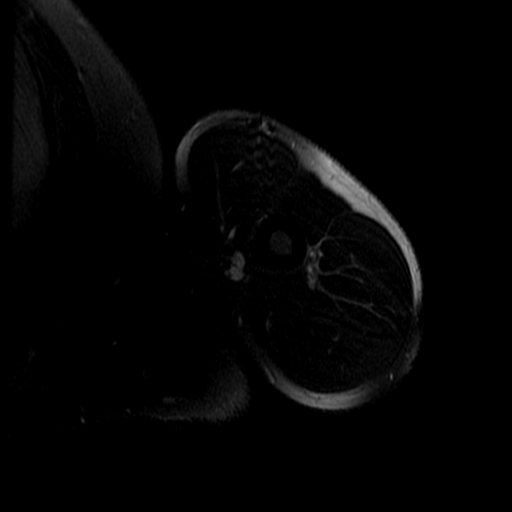
[im 25/50]
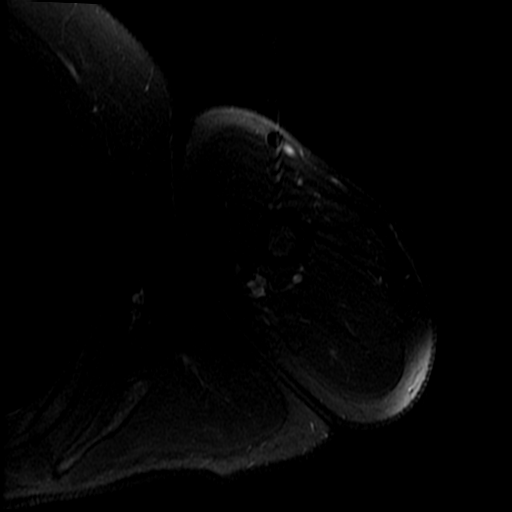
[im 33/50]
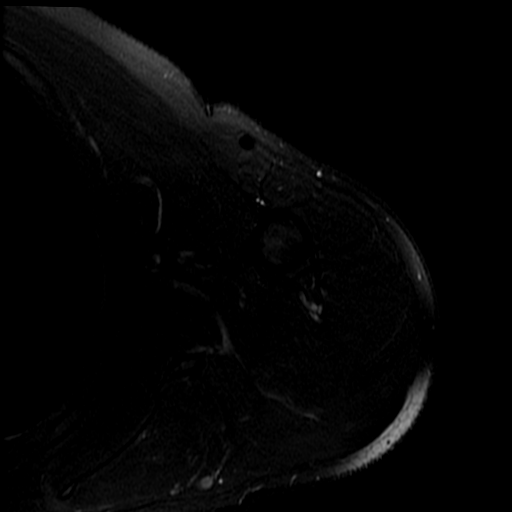
[im 41/50]
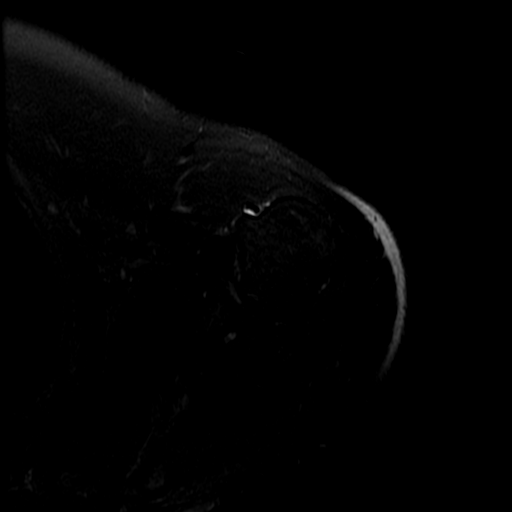
[im 50/50]
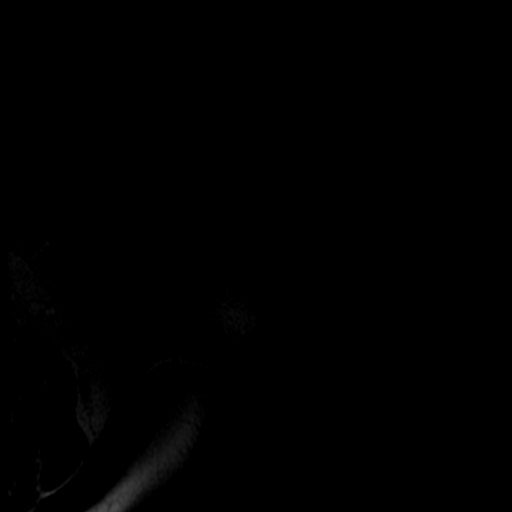

[19 of 40 positions shown; findings below may reference images not displayed]

FINDINGS: The lesion in the posterior aspect of the proximal left humeral
shaft has increased from 6 mm to 9 mm in diameter with increased
edema in the surrounding portion of the humerus. The lesion enhances
after contrast administration. The finding is consistent with
metastatic disease.

There are 2 tiny cystic lesions in the humeral head which appear
stable.

No other abnormality.
IMPRESSION: Enlarging enhancing mass lesion in the proximal left humeral shaft
consistent with metastatic disease.

## 2016-01-03 ENCOUNTER — Telehealth: Payer: Self-pay | Admitting: *Deleted

## 2016-01-03 NOTE — Telephone Encounter (Signed)
Delena Serve with [employer] called reporting they are "unable to reach patient for delivery.  Not sure if he has some on hand but the Cabometyx ordered on 12-18-2015 has not been shipped yet.  Need confirmation of phone numbers for this patient."  Provided [place] demographic phone numbers which [employer] did not have on hand.   Called patient leaving voicemail hat [employer] is trying to reach him.

## 2016-01-10 ENCOUNTER — Encounter: Payer: Self-pay | Admitting: *Deleted

## 2016-01-14 DIAGNOSIS — C7800 Secondary malignant neoplasm of unspecified lung: Secondary | ICD-10-CM | POA: Diagnosis not present

## 2016-01-16 DIAGNOSIS — C7800 Secondary malignant neoplasm of unspecified lung: Secondary | ICD-10-CM | POA: Diagnosis not present

## 2016-01-18 ENCOUNTER — Other Ambulatory Visit: Payer: Self-pay | Admitting: *Deleted

## 2016-01-18 DIAGNOSIS — K8689 Other specified diseases of pancreas: Secondary | ICD-10-CM

## 2016-01-18 DIAGNOSIS — K611 Rectal abscess: Secondary | ICD-10-CM

## 2016-01-18 DIAGNOSIS — F419 Anxiety disorder, unspecified: Secondary | ICD-10-CM

## 2016-01-18 DIAGNOSIS — C7951 Secondary malignant neoplasm of bone: Secondary | ICD-10-CM

## 2016-01-18 DIAGNOSIS — C649 Malignant neoplasm of unspecified kidney, except renal pelvis: Secondary | ICD-10-CM

## 2016-01-18 MED ORDER — ALPRAZOLAM 1 MG PO TABS: 1.0000 mg | ORAL_TABLET | Freq: Three times a day (TID) | ORAL | Status: DC | PRN

## 2016-01-23 ENCOUNTER — Telehealth: Payer: Self-pay | Admitting: Pharmacist

## 2016-01-23 ENCOUNTER — Encounter: Payer: Self-pay | Admitting: *Deleted

## 2016-01-23 ENCOUNTER — Other Ambulatory Visit: Payer: Self-pay | Admitting: *Deleted

## 2016-01-23 ENCOUNTER — Other Ambulatory Visit: Payer: Self-pay | Admitting: Oncology

## 2016-01-23 DIAGNOSIS — C7951 Secondary malignant neoplasm of bone: Secondary | ICD-10-CM

## 2016-01-23 DIAGNOSIS — C649 Malignant neoplasm of unspecified kidney, except renal pelvis: Secondary | ICD-10-CM

## 2016-01-23 DIAGNOSIS — K8689 Other specified diseases of pancreas: Secondary | ICD-10-CM

## 2016-01-23 DIAGNOSIS — F419 Anxiety disorder, unspecified: Secondary | ICD-10-CM

## 2016-01-23 DIAGNOSIS — K611 Rectal abscess: Secondary | ICD-10-CM

## 2016-01-23 MED ORDER — GABAPENTIN 300 MG PO CAPS: 300.0000 mg | ORAL_CAPSULE | Freq: Three times a day (TID) | ORAL | Status: DC

## 2016-01-23 NOTE — Telephone Encounter (Signed)
01/23/16: Attempted to reach patient for follow up on oral medication: Cabometyx. No answer. Left VM for patient to call back with any questions or issues.   Thank you,  Montel Clock, PharmD, [facility] [phone]

## 2016-01-28 ENCOUNTER — Other Ambulatory Visit: Payer: Self-pay | Admitting: *Deleted

## 2016-01-28 DIAGNOSIS — C649 Malignant neoplasm of unspecified kidney, except renal pelvis: Secondary | ICD-10-CM

## 2016-01-28 DIAGNOSIS — C7951 Secondary malignant neoplasm of bone: Secondary | ICD-10-CM

## 2016-01-28 DIAGNOSIS — C7800 Secondary malignant neoplasm of unspecified lung: Secondary | ICD-10-CM

## 2016-01-29 ENCOUNTER — Other Ambulatory Visit (HOSPITAL_BASED_OUTPATIENT_CLINIC_OR_DEPARTMENT_OTHER): Payer: Medicare Other

## 2016-01-29 ENCOUNTER — Telehealth: Payer: Self-pay | Admitting: *Deleted

## 2016-01-29 ENCOUNTER — Ambulatory Visit (HOSPITAL_COMMUNITY)
Admission: RE | Admit: 2016-01-29 | Discharge: 2016-01-29 | Disposition: A | Discharge status: Home / Self Care | Payer: Medicare Other | Source: Ambulatory Visit | Attending: Oncology | Admitting: Oncology

## 2016-01-29 ENCOUNTER — Other Ambulatory Visit: Payer: Self-pay | Admitting: Oncology

## 2016-01-29 DIAGNOSIS — F419 Anxiety disorder, unspecified: Secondary | ICD-10-CM | POA: Insufficient documentation

## 2016-01-29 DIAGNOSIS — Z905 Acquired absence of kidney: Secondary | ICD-10-CM | POA: Insufficient documentation

## 2016-01-29 DIAGNOSIS — C7951 Secondary malignant neoplasm of bone: Secondary | ICD-10-CM

## 2016-01-29 DIAGNOSIS — K869 Disease of pancreas, unspecified: Secondary | ICD-10-CM | POA: Insufficient documentation

## 2016-01-29 DIAGNOSIS — K611 Rectal abscess: Secondary | ICD-10-CM

## 2016-01-29 DIAGNOSIS — C649 Malignant neoplasm of unspecified kidney, except renal pelvis: Secondary | ICD-10-CM | POA: Diagnosis not present

## 2016-01-29 DIAGNOSIS — Z9221 Personal history of antineoplastic chemotherapy: Secondary | ICD-10-CM | POA: Insufficient documentation

## 2016-01-29 DIAGNOSIS — K8689 Other specified diseases of pancreas: Secondary | ICD-10-CM

## 2016-01-29 DIAGNOSIS — Z923 Personal history of irradiation: Secondary | ICD-10-CM | POA: Insufficient documentation

## 2016-01-29 DIAGNOSIS — C7800 Secondary malignant neoplasm of unspecified lung: Secondary | ICD-10-CM | POA: Insufficient documentation

## 2016-01-29 DIAGNOSIS — C642 Malignant neoplasm of left kidney, except renal pelvis: Secondary | ICD-10-CM | POA: Diagnosis not present

## 2016-01-29 DIAGNOSIS — C787 Secondary malignant neoplasm of liver and intrahepatic bile duct: Secondary | ICD-10-CM | POA: Insufficient documentation

## 2016-01-29 DIAGNOSIS — C7902 Secondary malignant neoplasm of left kidney and renal pelvis: Secondary | ICD-10-CM | POA: Diagnosis not present

## 2016-01-29 LAB — COMPREHENSIVE METABOLIC PANEL
ALT: 39 U/L (ref 0–55)
AST: 24 U/L (ref 5–34)
Albumin: 3.4 g/dL — ABNORMAL LOW (ref 3.5–5.0)
Alkaline Phosphatase: 104 U/L (ref 40–150)
Anion Gap: 9 mEq/L (ref 3–11)
BUN: 6 mg/dL — ABNORMAL LOW (ref 7.0–26.0)
CO2: 29 mEq/L (ref 22–29)
Calcium: 9.2 mg/dL (ref 8.4–10.4)
Chloride: 101 mEq/L (ref 98–109)
Creatinine: 1.1 mg/dL (ref 0.7–1.3)
EGFR: 83 mL/min/{1.73_m2} — ABNORMAL LOW (ref >90)
Glucose: 123 mg/dl (ref 70–140)
Potassium: 4 mEq/L (ref 3.5–5.1)
Sodium: 139 mEq/L (ref 136–145)
Total Bilirubin: <0.3 mg/dL (ref 0.20–1.20)
Total Protein: 7.8 g/dL (ref 6.4–8.3)

## 2016-01-29 LAB — CBC WITH DIFFERENTIAL/PLATELET
BASO%: 0.9 % (ref 0.0–2.0)
Basophils Absolute: 0 10*3/uL (ref 0.0–0.1)
EOS%: 5.9 % (ref 0.0–7.0)
Eosinophils Absolute: 0.3 10*3/uL (ref 0.0–0.5)
HCT: 43.2 % (ref 38.4–49.9)
HGB: 14 g/dL (ref 13.0–17.1)
LYMPH%: 35 % (ref 14.0–49.0)
MCH: 31.6 pg (ref 27.2–33.4)
MCHC: 32.3 g/dL (ref 32.0–36.0)
MCV: 97.7 fL (ref 79.3–98.0)
MONO#: 0.3 10*3/uL (ref 0.1–0.9)
MONO%: 4.9 % (ref 0.0–14.0)
NEUT#: 2.9 10*3/uL (ref 1.5–6.5)
NEUT%: 53.3 % (ref 39.0–75.0)
Platelets: 166 10*3/uL (ref 140–400)
RBC: 4.42 10*6/uL (ref 4.20–5.82)
RDW: 20.4 % — ABNORMAL HIGH (ref 11.0–14.6)
WBC: 5.4 10*3/uL (ref 4.0–10.3)
lymph#: 1.9 10*3/uL (ref 0.9–3.3)

## 2016-01-29 MED ORDER — IOHEXOL 300 MG/ML  SOLN
100.0000 mL | Freq: Once | INTRAMUSCULAR | Status: AC | PRN
  Administered 2016-01-29: 100 mL via INTRAVENOUS

## 2016-01-29 NOTE — Telephone Encounter (Signed)
TC from [facility] with verbal report from today's CT scan of chest abd and pelvis.. Report is in [place]

## 2016-01-29 NOTE — Telephone Encounter (Signed)
Rhianna called asking if CT C/A/P today needs to be with and without contrast or if another test should be scheduled.  Original order was for without contrast and current order is with and without contrast.   Telephone order received and read back from Dr. [name] for today's CT C/A/P to only be done with contrast.  Rhianna given this order.

## 2016-01-31 ENCOUNTER — Ambulatory Visit (HOSPITAL_BASED_OUTPATIENT_CLINIC_OR_DEPARTMENT_OTHER): Payer: Medicare Other | Admitting: Oncology

## 2016-01-31 VITALS — BP 146/101 | HR 83 | Temp 98.0°F | Resp 18 | Ht 75.0 in

## 2016-01-31 DIAGNOSIS — C649 Malignant neoplasm of unspecified kidney, except renal pelvis: Secondary | ICD-10-CM

## 2016-01-31 DIAGNOSIS — C7951 Secondary malignant neoplasm of bone: Secondary | ICD-10-CM | POA: Diagnosis not present

## 2016-01-31 DIAGNOSIS — C7800 Secondary malignant neoplasm of unspecified lung: Secondary | ICD-10-CM

## 2016-01-31 DIAGNOSIS — M792 Neuralgia and neuritis, unspecified: Secondary | ICD-10-CM

## 2016-01-31 DIAGNOSIS — F419 Anxiety disorder, unspecified: Secondary | ICD-10-CM

## 2016-01-31 DIAGNOSIS — I2699 Other pulmonary embolism without acute cor pulmonale: Secondary | ICD-10-CM

## 2016-01-31 DIAGNOSIS — K611 Rectal abscess: Secondary | ICD-10-CM

## 2016-01-31 DIAGNOSIS — I10 Essential (primary) hypertension: Secondary | ICD-10-CM

## 2016-01-31 DIAGNOSIS — C7889 Secondary malignant neoplasm of other digestive organs: Secondary | ICD-10-CM

## 2016-01-31 DIAGNOSIS — K8689 Other specified diseases of pancreas: Secondary | ICD-10-CM

## 2016-01-31 MED ORDER — ALPRAZOLAM 1 MG PO TABS: 1.0000 mg | ORAL_TABLET | Freq: Three times a day (TID) | ORAL | Status: DC | PRN

## 2016-01-31 MED ORDER — HYDROMORPHONE HCL 8 MG PO TABS: 8.0000 mg | ORAL_TABLET | ORAL | Status: DC | PRN

## 2016-01-31 NOTE — Progress Notes (Signed)
Hematology and Oncology Follow Up Visit  Keith [name] [id number] August 27, 1966 50 y.o. 01/31/2016 9:16 AM  [name], MD  [name], M.D.  [name], MD  [name], MD    Principle Diagnosis: This is a 50 year old gentleman with stage IV renal cell carcinoma diagnosed in 2009.  Prior Therapy: 1. Status post laparoscopic radical nephrectomy.  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  2. Patient status post thoracotomy for a synchronous metastatic lung lesions done October 2009.  He had a lower lobe nodule, biopsy proven to be metastatic renal cell carcinoma.   3. Patient is status post stereotactic radiotherapy to pulmonary nodules in May of 2010. 4. He is S/P Sutent 50 mg 4 weeks on 2 weeks off from 10/2010 to 03/2013. He progressed at that time.  5. He is S/P radiation to the right sacral bone between 4/22 to 4/30.  6. He is S/P XRT to the left shoulder 03/20/14 to 03/31/14. 7. Votrient 800 mg by mouth daily from 03/2013 through 06/22/2015. Discontinued secondary to disease progression. 8. Nivolumab 3 mg/kg given every 2 weeks started on 06/29/2015. He is status post 4 cycles completed 08/10/2015. He developed disease progression in September 2016.  9. Status post radiation therapy to the left mid fibula completed on 11/14/2015. He received a grade 1 fraction.    Current therapy:  Cabometyx 60 mg daily started in November 2016.   Interim History:  Mr. Stepter presents today for a followup visit. Since his last visit, he continues to improve clinically. His performance status and activity level continues to improve and have gained more weight. His respiratory status is back to baseline without any dyspnea or shortness of breath.    He is ambulating without any difficulty and driving short distances. He has not reported any cough or hemoptysis. He continues to have chronic pain associated with his left lower extremity and has improved after  radiation therapy.  He tolerated Cabometyx without any recent complications. He denied any hand-foot syndrome, fatigue or other complications. His quality of life continues to improve on this therapy and have not deteriorated.   He has not reported any nausea or vomiting. He did not report any headaches blurred vision or double vision. Does not report any seizure activity or alteration of mental status. Does not report any abdominal pain or hematochezia. Does not report any hematuria but does report occasional hesitancy and nocturia. He does not report any rashes or lesions or petechiae. He does not report any lymphadenopathy. His performance status and activity level remain relatively stable at this time. Remainder of his review of system is unremarkable.  Medications: I have reviewed the patient's current medications.  Current Outpatient Prescriptions  Medication Sig Dispense Refill  . ALPRAZolam (XANAX) 1 MG tablet Take 1 tablet (1 mg total) by mouth 3 (three) times daily as needed for anxiety. 90 tablet 0  . ALPRAZolam (XANAX) 1 MG tablet Take 1 tablet (1 mg total) by mouth 3 (three) times daily as needed for anxiety. 90 tablet 0  . ALPRAZolam (XANAX) 1 MG tablet Take 1 tablet (1 mg total) by mouth 3 (three) times daily as needed for anxiety. 90 tablet 0  . Cabozantinib S-Malate (CABOMETYX) 60 MG TABS Take 60 mg by mouth daily. 30 tablet 0  . calcium carbonate (TUMS - DOSED IN MG ELEMENTAL CALCIUM) 500 MG chewable tablet Chew 1 tablet by mouth as needed for indigestion or heartburn.    . diphenhydrAMINE (  BENADRYL) 25 mg capsule Take 25 mg by mouth every 6 (six) hours as needed for itching.    . DULoxetine (CYMBALTA) 60 MG capsule TAKE ONE CAPSULE BY MOUTH TWICE DAILY 60 capsule 0  . enoxaparin (LOVENOX) 150 MG/ML injection Inject 0.93 mLs (140 mg total) into the skin daily. 30 Syringe 1  . feeding supplement, ENSURE ENLIVE, (ENSURE ENLIVE) LIQD Take 237 mLs by mouth 2 (two) times daily between  meals. 237 mL 12  . gabapentin (NEURONTIN) 300 MG capsule Take 1 capsule (300 mg total) by mouth 3 (three) times daily. 90 capsule 3  . HYDROmorphone (DILAUDID) 8 MG tablet Take 1 tablet (8 mg total) by mouth every 4 (four) hours as needed for moderate pain or severe pain. 50 tablet 0  . ibuprofen (ADVIL,MOTRIN) 200 MG tablet Take 400 mg by mouth every 6 (six) hours as needed for fever, headache, moderate pain or cramping.    . insulin glargine (LANTUS) 100 UNIT/ML injection Inject 26 Units into the skin at bedtime.    [name] lactulose (CHRONULAC) 10 GM/15ML solution Take 30 mLs (20 g total) by mouth 2 (two) times daily. 240 mL 0  . metoprolol tartrate (LOPRESSOR) 25 MG tablet Take 1 tablet (25 mg total) by mouth 2 (two) times daily. 60 tablet 0  . senna-docusate (SENOKOT-S) 8.6-50 MG tablet Take 1 tablet by mouth 2 (two) times daily. 30 tablet 0  . tiZANidine (ZANAFLEX) 4 MG tablet Take 4 mg by mouth every 8 (eight) hours as needed for muscle spasms.   0  . VIAGRA 100 MG tablet Take 50 mg by mouth as needed for erectile dysfunction.      No current facility-administered medications for this visit.     Allergies:  Allergies  Allergen Reactions  . Ceftriaxone Hives  . Hydrocodone Swelling    Past Medical History, Surgical history, Social history, and Family History were reviewed and updated.   Physical Exam: Blood pressure 146/101, pulse 83, temperature 98 F (36.7 C), temperature source Oral, resp. rate 18, height '6\' 3"'$  (1.905 m), SpO2 100 %. ECOG: 1 General appearance: Alert, awake gentleman without distress. Head: Normocephalic, without obvious abnormality. No oral thrush. Neck: no adenopathy, no masses.  Lymph nodes: Cervical, supraclavicular, and axillary nodes normal. Heart:regular rate and rhythm, S1, S2.  Lung: Clear throughout auscultation. No wheezes noted. Abdomen: soft, non-tender, without masses or organomegaly no rebound or guarding. EXT:. No edema noted. His protrusion on  the left tibia have decreased. Tender to touch. Not changed dramatically today. Neuro: no focal deficits noted. No weakness noted in his lower extremities.  Lab Results: Lab Results  Component Value Date   WBC 5.4 01/29/2016   HGB 14.0 01/29/2016   HCT 43.2 01/29/2016   MCV 97.7 01/29/2016   PLT 166 01/29/2016   EXAM: CT CHEST AND PELVIS WITH CONTRAST  CT ABDOMEN WITH AND WITHOUT CONTRAST  TECHNIQUE: Multidetector CT imaging of the chest and pelvis were performed during intravenous contrast administration. Multidetector CT imaging of the abdomen was performed following the standard protocol before and during bolus administration of intravenous contrast.  CONTRAST: 149m OMNIPAQUE IOHEXOL 300 MG/ML SOLN  COMPARISON: Chest CT 11/11/2015. Most recent abdominal pelvic CT of 09/12/2015.  FINDINGS: CT CHEST FINDINGS  Mediastinum/Nodes: No supraclavicular adenopathy. Tortuous thoracic aorta with borderline aneurysmal dilatation of the ascending segment at 4.0 cm. Mild cardiomegaly, without pericardial effusion. No central pulmonary embolism, on this non-dedicated study. No mediastinal adenopathy. Left infrahilar adenopathy is improved to resolved. Measures 1.1 x  1.6 cm on image 42/series 6. Compare 2.6 x 2.7 cm on 09/12/2015.  Lungs/Pleura: Trace left-sided pleural fluid is new. Surgical changes in the right lower lobe. 2 mm left upper lobe pulmonary nodule is unchanged on image 17/series 9.  New dependent left lower lobe opacity is favored to represent atelectasis. Similar appearance of areas of presumed radiation fibrosis within the right upper lobe. Similar right middle lobe peribronchovascular interstitial thickening, including on image 57/series 9. No endobronchial obstruction.  Musculoskeletal: Remote posterior right rib trauma.  CT ABDOMEN PELVIS FINDINGS  Hepatobiliary: Improvement to resolution of previously described arterially hyper enhancing  subcapsular focus in the right liver lobe. Vague hyper enhancement identified in this area 7 mm on image 37 of series 4. Compare 12 mm on the prior. Less well-defined today, and non apparent on portal venous phase imaging. No new liver lesion. Subtle pericholecystic edema or gallbladder wall thickening suspected on image 106/series 6. No calcified stone or biliary duct dilatation.  Pancreas: Pancreatic atrophy and duct dilatation involving the body and tail. The lesion within the pancreatic neck measures 11 mm on image 43/series 4 versus 18 mm on the prior.  Pancreatic head lesion demonstrates less enhancement today and is decreased in size. On the order of 2.3 x 2.6 cm on image 52/series 4. Compare 3.4 x 2.9 cm on the prior. No evidence of acute pancreatitis.  Spleen: Normal in size, without focal abnormality.  Adrenals/Urinary Tract: Normal adrenal glands. Left nephrectomy, without locally recurrent disease. Normal right kidney, without hydronephrosis or hydroureter. Normal urinary bladder.  Stomach/Bowel: Normal stomach, without wall thickening. Normal colon, appendix, and terminal ileum. Normal small bowel.  Vascular/Lymphatic: Aortic and branch vessel atherosclerosis. No abdominopelvic adenopathy.  Reproductive: Normal prostate.  Other: No significant free fluid.  Musculoskeletal: Dorsal spinal stimulator. Lytic lesion involving the proximal right femur similar,. Right inferior pubic ramus lytic lesion measures 1.6 cm on image 214/series 6. Similar. Right sacral lytic metastasis measures 2.2 x 3.0 cm today versus 2.4 x 3.0 cm on the prior, suggesting stability.  IMPRESSION: 1. Response to therapy of thoracic nodal, hepatic, and pancreatic metastasis, status post left nephrectomy. 2. Similar appearance the lungs. Areas of presumed radiation fibrosis within the right upper lobe with similar interstitial thickening within the central right middle lobe. 3.  Similar osseous metastasis. 4. Trace left pleural fluid with left lower lobe opacity, favored to represent atelectasis or possibly scarring. 5. Subtle pericholecystic edema cannot be excluded. Correlate with any symptoms to suggest acute cholecystitis. These results will be called to the ordering clinician or representative by the Radiologist Assistant, and communication documented in the PACS or zVision Dashboard.   Impression and Plan:  This is a pleasant 50 year old gentleman with the following issues. 1. Metastatic renal cell carcinoma.  He has documented disease to the lung and the pancreas. He is status post multiple therapies outlined above and recently progressed in October 2016. He is currently on Cabometyx and have tolerated it well. CT scan on 01/29/2016 was reviewed today and showed an excellent response to therapy. The plan is to continue on the same dose and schedule and repeat imaging studies in 3 months.   2. Depression/Anxiety: He remains on Xanax and Cymbalta. His mood is much improved. I have refilled his Xanax at this time.   3. Weight loss: improved at this time with a reasonable appetite. His weight continues to improve and up by 10 pounds. 4. Neuropathic pain. He is on gabapentin and Dilaudid for pain. He also has  a subcutaneous pain medication pump. His pain is controlled for now. 5. Left lower extremity pain and a mass: Much improved after radiation therapy. 6. Hypertension: Controlled at this time. This will need to be monitored moving followed. 7. Pulmonary embolism: He is currently on Lovenox and has no problem with given himself injections. I anticipate that he will require lifetime anticoagulation. No evidence of recurrent thrombosis noted on his recent CT scan. 8. Prognosis: I continue to emphasize the fact that he has an incurable malignancy and he has had stage IV disease since 2009. Any treatment is palliative at this time. His quality of life and performance  status have improved on treatment and the plan is to continue for the time being. 9. Hyperglycemia: Managed by his primary care physician at this time. 10. Follow up: in 4 weeks.   [facility], MD 01/31/2016

## 2016-02-02 ENCOUNTER — Telehealth: Payer: Self-pay | Admitting: Oncology

## 2016-02-02 NOTE — Telephone Encounter (Signed)
Left message re 3/9 appointments. Schedule mailed.

## 2016-02-05 ENCOUNTER — Other Ambulatory Visit: Payer: Self-pay | Admitting: Oncology

## 2016-02-12 ENCOUNTER — Other Ambulatory Visit: Payer: Self-pay | Admitting: Oncology

## 2016-02-13 ENCOUNTER — Encounter: Payer: Self-pay | Admitting: Pharmacist

## 2016-02-13 NOTE — Progress Notes (Signed)
..  Oral Chemotherapy Follow-Up Form  Original Start date of oral chemotherapy: _10/23/2016__   Called patient today to follow up regarding patient's oral chemotherapy medication: _Cabometyx_  Pt is doing well today. His scans earlier this month should improvement and good response to therapy. His appetite and energy levels have improved as well as his pain. He still has pain in his leg but states he is able to be more active. This is great news for Keith [name]. He has been on therapy for 4 months now and doing great. No side effects to report other than minimal diarrhea. No missed doses.   Pt reports __0__ tablets/doses missed in the last month.    Pt reports the following side effects: _minimal diarrhea___   Will follow up and call patient again in _1 month___   Thank you,  Montel Clock, PharmD, [facility]

## 2016-02-14 ENCOUNTER — Other Ambulatory Visit: Payer: Self-pay | Admitting: *Deleted

## 2016-02-14 DIAGNOSIS — C7800 Secondary malignant neoplasm of unspecified lung: Secondary | ICD-10-CM | POA: Diagnosis not present

## 2016-02-16 DIAGNOSIS — C7800 Secondary malignant neoplasm of unspecified lung: Secondary | ICD-10-CM | POA: Diagnosis not present

## 2016-02-28 ENCOUNTER — Telehealth: Payer: Self-pay | Admitting: Oncology

## 2016-02-28 ENCOUNTER — Other Ambulatory Visit (HOSPITAL_BASED_OUTPATIENT_CLINIC_OR_DEPARTMENT_OTHER): Payer: Medicare Other

## 2016-02-28 ENCOUNTER — Ambulatory Visit (HOSPITAL_BASED_OUTPATIENT_CLINIC_OR_DEPARTMENT_OTHER): Payer: Medicare Other | Admitting: Oncology

## 2016-02-28 VITALS — BP 147/91 | HR 95 | Temp 97.1°F | Resp 18 | Wt 193.9 lb

## 2016-02-28 DIAGNOSIS — C7951 Secondary malignant neoplasm of bone: Secondary | ICD-10-CM | POA: Diagnosis not present

## 2016-02-28 DIAGNOSIS — C7889 Secondary malignant neoplasm of other digestive organs: Secondary | ICD-10-CM

## 2016-02-28 DIAGNOSIS — I2699 Other pulmonary embolism without acute cor pulmonale: Secondary | ICD-10-CM

## 2016-02-28 DIAGNOSIS — C7800 Secondary malignant neoplasm of unspecified lung: Secondary | ICD-10-CM | POA: Diagnosis not present

## 2016-02-28 DIAGNOSIS — C649 Malignant neoplasm of unspecified kidney, except renal pelvis: Secondary | ICD-10-CM

## 2016-02-28 DIAGNOSIS — E7251 Non-ketotic hyperglycinemia: Secondary | ICD-10-CM

## 2016-02-28 DIAGNOSIS — F419 Anxiety disorder, unspecified: Secondary | ICD-10-CM

## 2016-02-28 DIAGNOSIS — I10 Essential (primary) hypertension: Secondary | ICD-10-CM

## 2016-02-28 DIAGNOSIS — K8689 Other specified diseases of pancreas: Secondary | ICD-10-CM

## 2016-02-28 DIAGNOSIS — K611 Rectal abscess: Secondary | ICD-10-CM

## 2016-02-28 LAB — COMPREHENSIVE METABOLIC PANEL
ALT: 50 U/L (ref 0–55)
AST: 31 U/L (ref 5–34)
Albumin: 3.5 g/dL (ref 3.5–5.0)
Alkaline Phosphatase: 96 U/L (ref 40–150)
Anion Gap: 7 mEq/L (ref 3–11)
BUN: 5.8 mg/dL — ABNORMAL LOW (ref 7.0–26.0)
CO2: 30 mEq/L — ABNORMAL HIGH (ref 22–29)
Calcium: 8.7 mg/dL (ref 8.4–10.4)
Chloride: 101 mEq/L (ref 98–109)
Creatinine: 1.1 mg/dL (ref 0.7–1.3)
EGFR: 81 mL/min/{1.73_m2} — ABNORMAL LOW (ref >90)
Glucose: 109 mg/dl (ref 70–140)
Potassium: 3.8 mEq/L (ref 3.5–5.1)
Sodium: 138 mEq/L (ref 136–145)
Total Bilirubin: <0.3 mg/dL (ref 0.20–1.20)
Total Protein: 7.5 g/dL (ref 6.4–8.3)

## 2016-02-28 LAB — CBC WITH DIFFERENTIAL/PLATELET
BASO%: 1 % (ref 0.0–2.0)
Basophils Absolute: 0.1 10*3/uL (ref 0.0–0.1)
EOS%: 5.2 % (ref 0.0–7.0)
Eosinophils Absolute: 0.3 10*3/uL (ref 0.0–0.5)
HCT: 42.2 % (ref 38.4–49.9)
HGB: 13.8 g/dL (ref 13.0–17.1)
LYMPH%: 45.4 % (ref 14.0–49.0)
MCH: 32.9 pg (ref 27.2–33.4)
MCHC: 32.7 g/dL (ref 32.0–36.0)
MCV: 100.6 fL — ABNORMAL HIGH (ref 79.3–98.0)
MONO#: 0.3 10*3/uL (ref 0.1–0.9)
MONO%: 5 % (ref 0.0–14.0)
NEUT#: 2.4 10*3/uL (ref 1.5–6.5)
NEUT%: 43.4 % (ref 39.0–75.0)
Platelets: 156 10*3/uL (ref 140–400)
RBC: 4.19 10*6/uL — ABNORMAL LOW (ref 4.20–5.82)
RDW: 17.8 % — ABNORMAL HIGH (ref 11.0–14.6)
WBC: 5.4 10*3/uL (ref 4.0–10.3)
lymph#: 2.5 10*3/uL (ref 0.9–3.3)

## 2016-02-28 MED ORDER — ALPRAZOLAM 1 MG PO TABS: 1.0000 mg | ORAL_TABLET | Freq: Three times a day (TID) | ORAL | Status: DC | PRN

## 2016-02-28 MED ORDER — DULOXETINE HCL 60 MG PO CPEP
60.0000 mg | ORAL_CAPSULE | Freq: Two times a day (BID) | ORAL | Status: DC
Start: 2016-02-28 — End: 2016-04-16

## 2016-02-28 NOTE — Telephone Encounter (Signed)
Gave adn printed appt sched and avs for pt for April

## 2016-02-28 NOTE — Progress Notes (Signed)
Hematology and Oncology Follow Up Visit  Keith [name] [id number] [birth year] 50 y.o. 02/28/2016 2:58 PM  Keith Bring, MD  [name], M.D.  [name], MD  [name], MD    Principle Diagnosis: This is a 50 year old gentleman with stage IV renal cell carcinoma diagnosed in 2009.  Prior Therapy: 1. Status post laparoscopic radical nephrectomy.  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  2. Patient status post thoracotomy for a synchronous metastatic lung lesions done October 2009.  He had a lower lobe nodule, biopsy proven to be metastatic renal cell carcinoma.   3. Patient is status post stereotactic radiotherapy to pulmonary nodules in May of 2010. 4. He is S/P Sutent 50 mg 4 weeks on 2 weeks off from 10/2010 to 03/2013. He progressed at that time.  5. He is S/P radiation to the right sacral bone between 4/22 to 4/30.  6. He is S/P XRT to the left shoulder 03/20/14 to 03/31/14. 7. Votrient 800 mg by mouth daily from 03/2013 through 06/22/2015. Discontinued secondary to disease progression. 8. Nivolumab 3 mg/kg given every 2 weeks started on 06/29/2015. He is status post 4 cycles completed 08/10/2015. He developed disease progression in September 2016.  9. Status post radiation therapy to the left mid fibula completed on 11/14/2015. He received a grade 1 fraction.    Current therapy:  Cabometyx 60 mg daily started in November 2016.   Interim History:  [name] presents today for a followup visit. Since his last visit, he reports no recent change in his health. He continues to improve clinically. He tolerated Cabometyx without any recent complications. He denied any hand-foot syndrome, fatigue or other complications. His quality of life continues to improve on this therapy and have not deteriorated. He does report some periodic GI symptoms including diarrhea and nausea but is adjusted to it. His mood is stable and his anxiety have been reasonably  controlled.  He remains active and attends to activities of daily living regular basis. He is able to ambulate without any difficulties. He does report a pain on the lateral side of his left leg which is chronic in nature.  He denied any respiratory symptoms at this time. He has not reported any cough or hemoptysis. He continues to use Lovenox without any issues. He denied any bleeding or thrombosis episode.  He did not report any headaches blurred vision or double vision. Does not report any seizure activity or alteration of mental status. Does not report any abdominal pain or hematochezia. Does not report any hematuria but does report occasional hesitancy and nocturia. He does not report any rashes or lesions or petechiae. He does not report any lymphadenopathy.  Remainder of his review of system is unremarkable.  Medications: I have reviewed the patient's current medications.  Current Outpatient Prescriptions  Medication Sig Dispense Refill  . ALPRAZolam (XANAX) 1 MG tablet Take 1 tablet (1 mg total) by mouth 3 (three) times daily as needed for anxiety. 90 tablet 0  . CABOMETYX 60 MG TABS TAKE 1 TABLET BY MOUTH ONE TIME DAILY WITH AT LEAST 8 OZ OF WATER ON AN EMPTY STOMACH. DO NOT EAT FOR 2 HOURS BEFORE OR 1 HOUR AFTER. SWALLO 30 tablet 0  . calcium carbonate (TUMS - DOSED IN MG ELEMENTAL CALCIUM) 500 MG chewable tablet Chew 1 tablet by mouth as needed for indigestion or heartburn.    . diphenhydrAMINE (BENADRYL) 25 mg capsule Take 25 mg by mouth every 6 (  six) hours as needed for itching.    . DULoxetine (CYMBALTA) 60 MG capsule Take 1 capsule (60 mg total) by mouth 2 (two) times daily. 60 capsule 0  . enoxaparin (LOVENOX) 150 MG/ML injection INJECT 0.93 MLS (140 MG TOTAL) INTO THE SKIN DAILY. 30 Syringe 1  . feeding supplement, ENSURE ENLIVE, (ENSURE ENLIVE) LIQD Take 237 mLs by mouth 2 (two) times daily between meals. 237 mL 12  . gabapentin (NEURONTIN) 300 MG capsule Take 1 capsule (300 mg  total) by mouth 3 (three) times daily. 90 capsule 3  . HYDROmorphone (DILAUDID) 8 MG tablet Take 1 tablet (8 mg total) by mouth every 4 (four) hours as needed for moderate pain or severe pain. 50 tablet 0  . ibuprofen (ADVIL,MOTRIN) 200 MG tablet Take 400 mg by mouth every 6 (six) hours as needed for fever, headache, moderate pain or cramping.    . insulin glargine (LANTUS) 100 UNIT/ML injection Inject 26 Units into the skin at bedtime.    [name] lactulose (CHRONULAC) 10 GM/15ML solution Take 30 mLs (20 g total) by mouth 2 (two) times daily. 240 mL 0  . metoprolol tartrate (LOPRESSOR) 25 MG tablet Take 1 tablet (25 mg total) by mouth 2 (two) times daily. 60 tablet 0  . senna-docusate (SENOKOT-S) 8.6-50 MG tablet Take 1 tablet by mouth 2 (two) times daily. 30 tablet 0  . tiZANidine (ZANAFLEX) 4 MG tablet Take 4 mg by mouth every 8 (eight) hours as needed for muscle spasms.   0  . VIAGRA 100 MG tablet Take 50 mg by mouth as needed for erectile dysfunction.      No current facility-administered medications for this visit.     Allergies:  Allergies  Allergen Reactions  . Ceftriaxone Hives  . Hydrocodone Swelling    Past Medical History, Surgical history, Social history, and Family History were reviewed and updated.   Physical Exam: Blood pressure 147/91, pulse 95, temperature 97.1 F (36.2 C), temperature source Oral, resp. rate 18, weight 193 lb 14.4 oz (87.952 kg), SpO2 99 %. ECOG: 1 General appearance: Well-appearing gentleman without distress. Head: Normocephalic, without obvious abnormality. No oral ulcers or lesions. Neck: no adenopathy, no masses.  Lymph nodes: Cervical, supraclavicular, and axillary nodes normal. Heart:regular rate and rhythm, S1, S2.  Lung: Clear throughout auscultation. No wheezes noted. Abdomen: soft, non-tender, without masses or organomegaly no rebound or guarding. EXT: No edema or induration. Tender to touch on the lateral side of his left lower  extremity. Neuro: No focal deficits noted. He is ambulating without difficulties.  Lab Results: Lab Results  Component Value Date   WBC 5.4 02/28/2016   HGB 13.8 02/28/2016   HCT 42.2 02/28/2016   MCV 100.6* 02/28/2016   PLT 156 02/28/2016     Impression and Plan:  This is a pleasant 50 year old gentleman with the following issues. 1. Metastatic renal cell carcinoma.  He has documented disease to the lung and the pancreas. He is status post multiple therapies outlined above and recently progressed in October 2016. He is currently on Cabometyx and have tolerated it well. CT scan on 02/07/2017showed an excellent response to therapy. His quality of life and overall health have improved on this medication and the plan is to continue the same dose and schedule. We will repeat imaging studies in 2 months. 2. Depression/Anxiety: He remains on Xanax and Cymbalta. His mood is much improved. I have refilled his Xanax a and Cymbalta today. 3. Weight loss: improved at this time with a  reasonable appetite. His weight have increased close to 4 pounds the last 2 months. 4. Neuropathic pain. He is on gabapentin and Dilaudid for pain. He also has a subcutaneous pain medication pump. His pain is manageable at this time no changes. 5. Left lower extremity pain and a mass: Much improved after radiation therapy. 6. Hypertension: Controlled at this time. No medication is needed at this time. 7. Pulmonary embolism: He is currently on Lovenox and has no problem with given himself injections. This will need to continue indefinitely. He reports no issues of continuing it. 8. Prognosis: He does have an incurable malignancy however he responded well to treatment that the plan is to continue with this palliative approach.. 9. Hyperglycemia: Managed by his primary care physician at this time. 10. Follow up: in 4 weeks.   [facility], MD 02/28/2016

## 2016-03-12 ENCOUNTER — Telehealth: Payer: Self-pay | Admitting: Pharmacist

## 2016-03-12 NOTE — Telephone Encounter (Signed)
03/12/16: Attempted to reach patient for follow up on oral medication: Cabometyx. No answer. Left VM for patient to call back with any questions or issues.   Thank you,  Montel Clock, PharmD, [facility] [phone]

## 2016-03-13 ENCOUNTER — Other Ambulatory Visit: Payer: Self-pay | Admitting: Oncology

## 2016-03-13 DIAGNOSIS — C7800 Secondary malignant neoplasm of unspecified lung: Secondary | ICD-10-CM | POA: Diagnosis not present

## 2016-03-15 DIAGNOSIS — C7800 Secondary malignant neoplasm of unspecified lung: Secondary | ICD-10-CM | POA: Diagnosis not present

## 2016-03-19 IMAGING — CT CT CHEST W/ CM
2 of 5 series · 16 of 46 positions shown, 18 images · IV contrast (OMNIPAQUE)
Comparison: January 20, 2014 CT scan ; September 20, 2013 CT scan

CLINICAL DATA: Metastatic renal cell carcinoma for restaging

EXAM:
CT CHEST, ABDOMEN, AND PELVIS WITH CONTRAST
TECHNIQUE: Multidetector CT imaging of the chest, abdomen and pelvis was
performed following the standard protocol during bolus
administration of intravenous contrast.
CONTRAST:  100mL OMNIPAQUE IOHEXOL 300 MG/ML  SOLN

[Series 2: cap with st · axial · 0.81mm/px · z∈[-733,-133]mm · 13 of 136 slices shown, 15 images]
[im 8/136  soft-tissue]
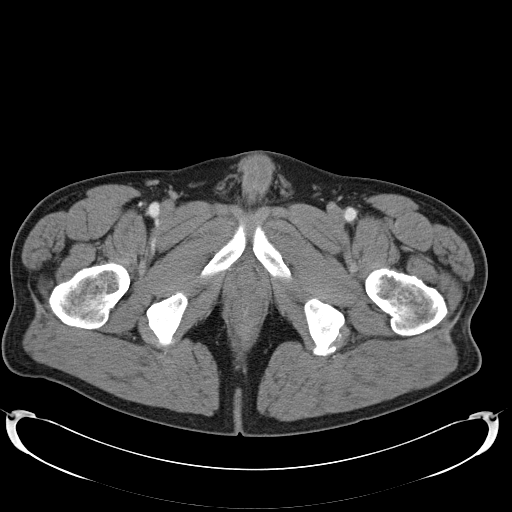
[im 8/136  bone]
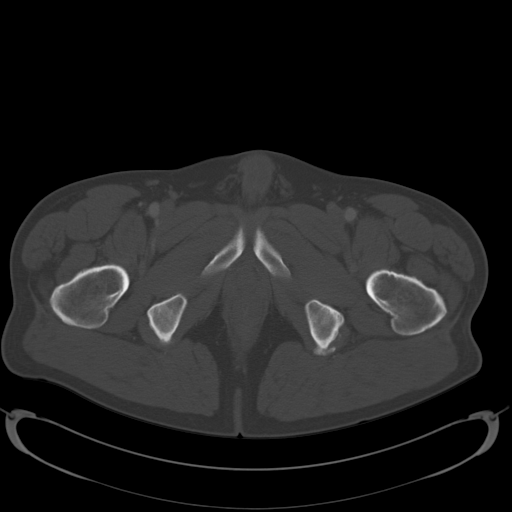
[im 16/136  soft-tissue]
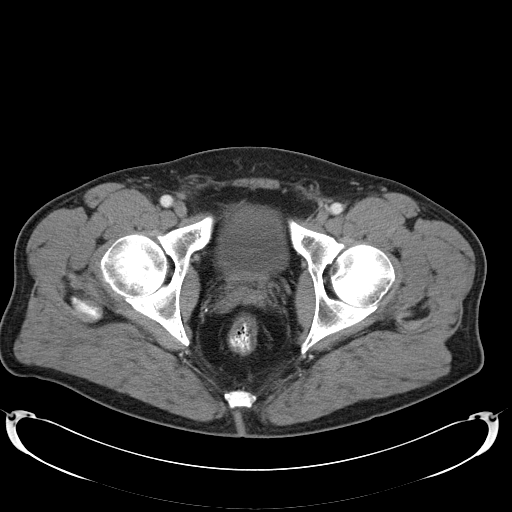
[im 31/136  soft-tissue]
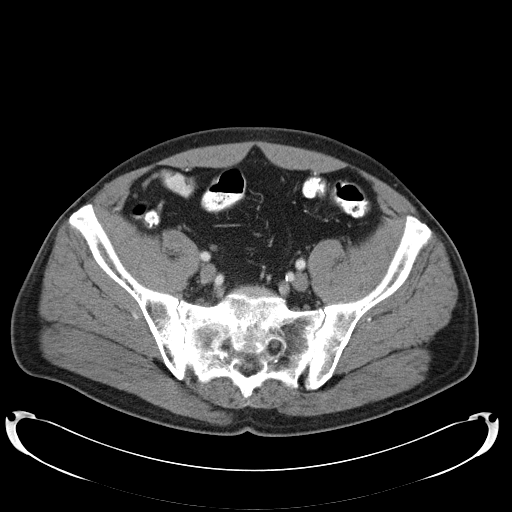
[im 38/136  soft-tissue]
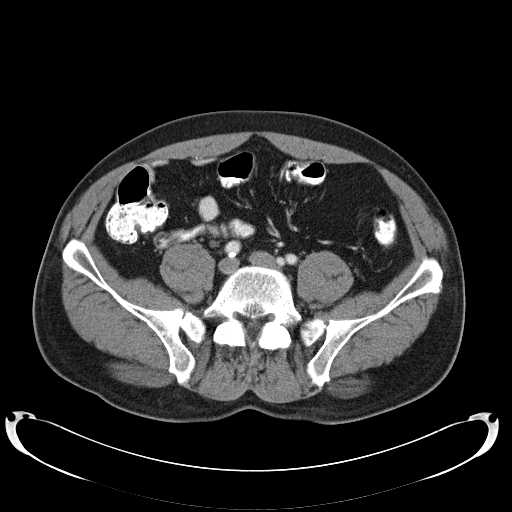
[im 46/136  soft-tissue]
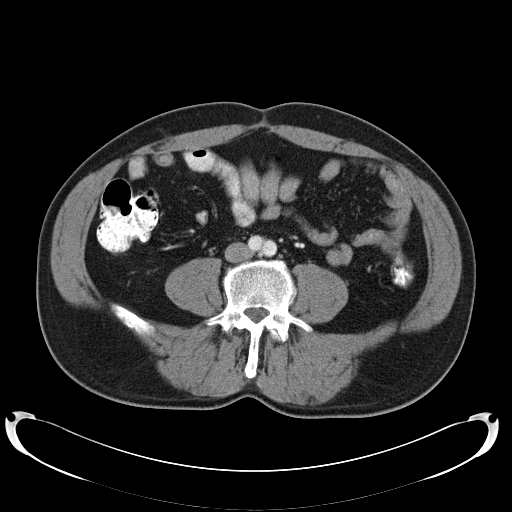
[im 61/136  soft-tissue]
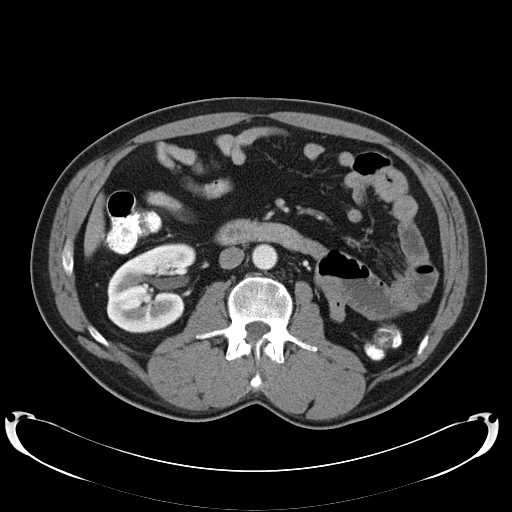
[im 68/136  soft-tissue]
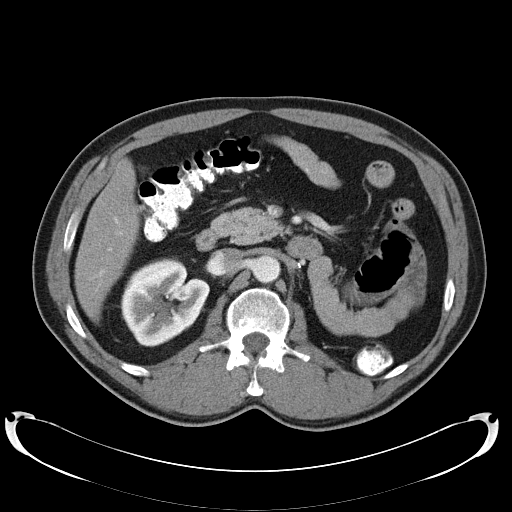
[im 76/136  soft-tissue]
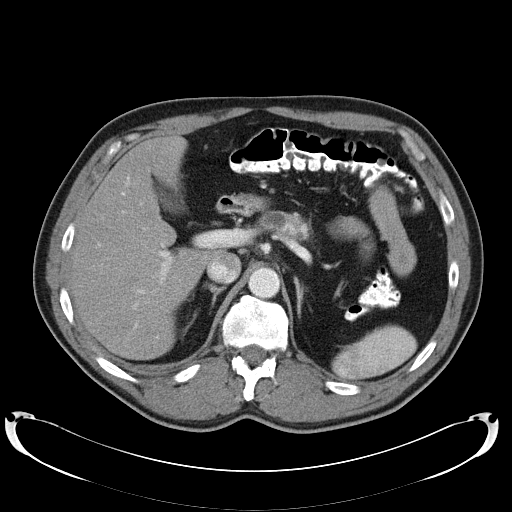
[im 91/136  soft-tissue]
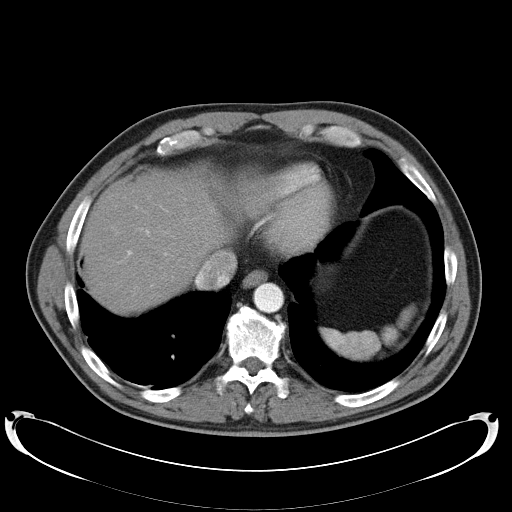
[im 91/136  bone]
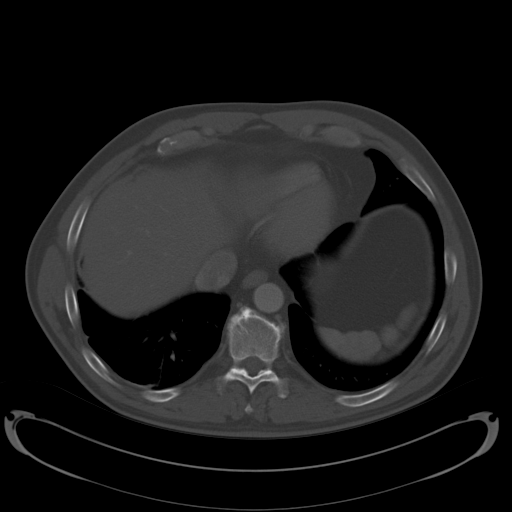
[im 98/136  soft-tissue]
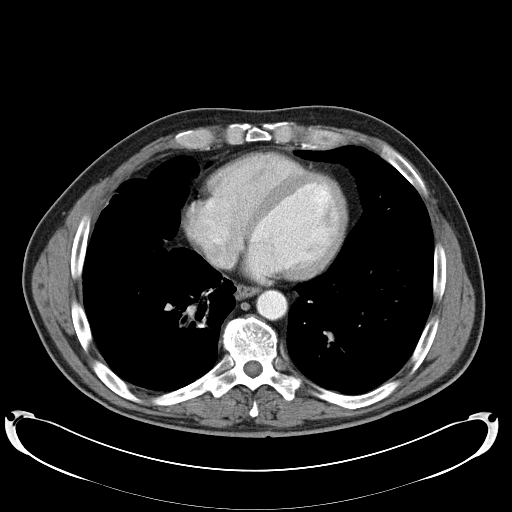
[im 106/136  soft-tissue]
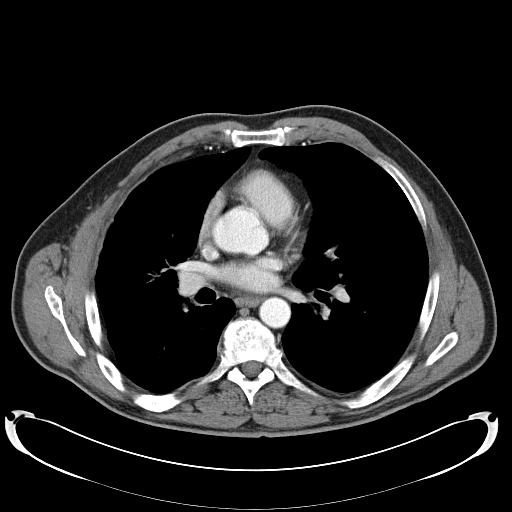
[im 121/136  soft-tissue]
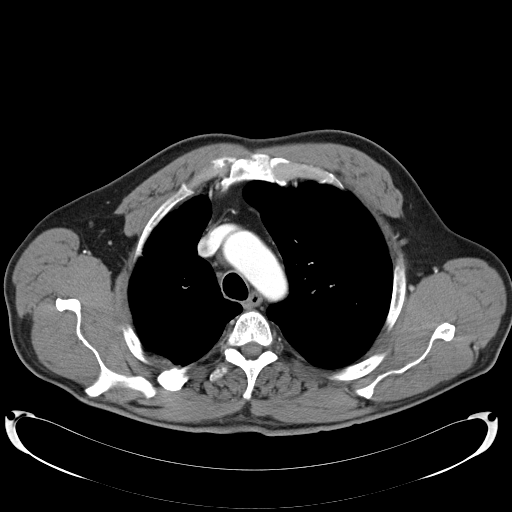
[im 128/136  soft-tissue]
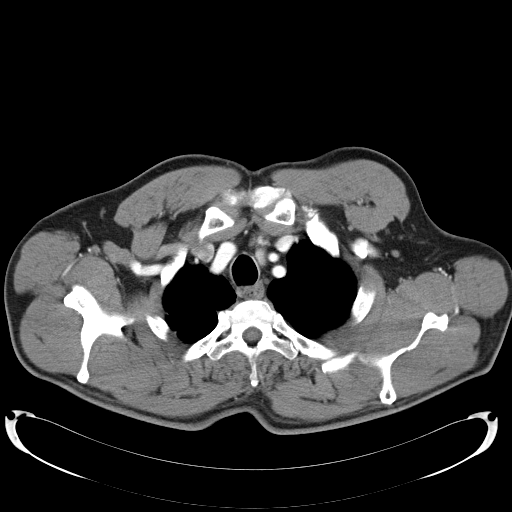

[Series 602: <mpr thick range> · coronal · 1.33mm/px · 3 of 139 slices shown]
[im 47/139  soft-tissue]
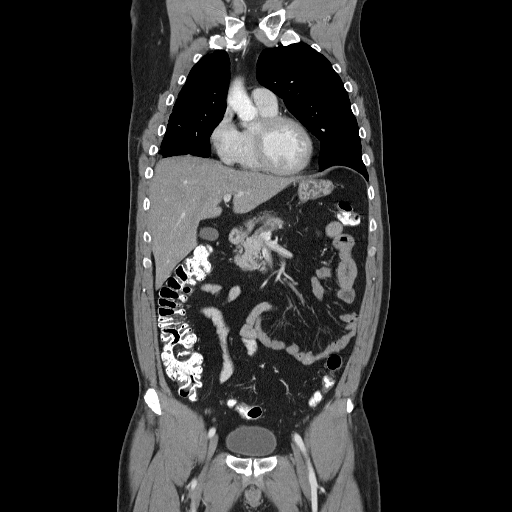
[im 62/139  soft-tissue]
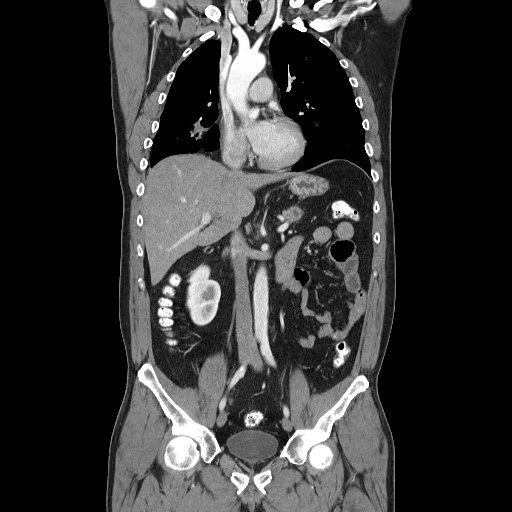
[im 77/139  soft-tissue]
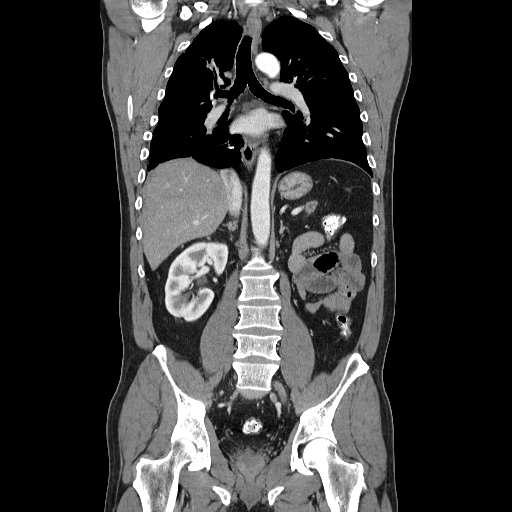

[16 of 46 positions shown; findings below may reference images not displayed]

FINDINGS: CT CHEST FINDINGS

Thoracic inlet is normal. Thyroid is normal. There is no significant
mediastinal or left hilar adenopathy. There are no pleural
effusions. The left lung is clear. Old superior posterior right rib
fracture, healed, is stable.

Architectural distortion right upper lobe is stable. Architectural
distortion medial right lower lobe is stable. Again identified is a
right perihilar lesion seen anterior to the right hilum, today
measuring 24 x 20 mm, previously measuring 21 x 17 mm. On image
number 26 there is a 4 mm pulmonary nodule laterally in the right
upper lobe which is stable from January 20, 2014.

CT ABDOMEN AND PELVIS FINDINGS

The liver, gallbladder, spleen, and adrenal glands are normal. A
tiny low-attenuation lesion lower pole right kidney is well below a
cm in size and too small to characterize, but stable. The left
kidney is surgically absent.

There is no significant mesenteric or retroperitoneal adenopathy.
There is no ascites. The abdominal aorta is not dilated.

There is a nonobstructive bowel gas pattern. Bowel and appendix are
normal. Bladder and reproductive organs are normal. There are no
acute or concerning musculoskeletal abnormalities in the abdomen and
pelvis.

There are multiple hypervascular pancreatic lesions. Most inferiorly
in the pancreatic head, there is a 12 x 10 mm enhancing lesion that
previously measured 16 x 12 mm. More proximally in the neck of the
pancreas, there is an enhancing lesion that measures 12 x 9 mm as
compared to 16 x 12 mm previously. In the body of the pancreas there
is a hyper attenuating 28 x 17 mm lesion that previously measured 30
x 17 mm. There are multiple dilatations of the pancreatic duct, up
to a diameter of 11 mm, similar to the prior study.
IMPRESSION: 1. Slight interval increase in the size of the right lung lesion
from 21 x 17 to 24 x 20 mm.
2. Numerous hypervascular pancreatic lesions, all showing interval
mild decrease in size. Similar associated pancreatic ductal
dilatation.

## 2016-03-25 DIAGNOSIS — M5417 Radiculopathy, lumbosacral region: Secondary | ICD-10-CM | POA: Diagnosis not present

## 2016-03-25 DIAGNOSIS — M25551 Pain in right hip: Secondary | ICD-10-CM | POA: Diagnosis not present

## 2016-03-25 DIAGNOSIS — Z9689 Presence of other specified functional implants: Secondary | ICD-10-CM | POA: Diagnosis not present

## 2016-03-25 DIAGNOSIS — C642 Malignant neoplasm of left kidney, except renal pelvis: Secondary | ICD-10-CM | POA: Diagnosis not present

## 2016-03-28 ENCOUNTER — Ambulatory Visit: Payer: Medicare Other | Admitting: Oncology

## 2016-03-28 ENCOUNTER — Telehealth: Payer: Self-pay | Admitting: *Deleted

## 2016-03-28 ENCOUNTER — Other Ambulatory Visit: Payer: Medicare Other

## 2016-03-28 NOTE — Telephone Encounter (Signed)
Patient called and stated,"I'm not going to make it to my appointment today because I'm sick. I started a Z-pak yesterday. Please cancel my appointments and tell Dr. [name]."

## 2016-04-03 IMAGING — MR MR CERVICAL SPINE WO/W CM
4 of 8 series · 18 of 48 positions shown · IV contrast (multihance)
Comparison: Brain MRIs 04/24/2013 and earlier.

CLINICAL DATA: 48-year-old male with metastatic renal cell
carcinoma. Restaging. Neck pain. Subsequent encounter.

EXAM:
MRI CERVICAL SPINE WITHOUT AND WITH CONTRAST
TECHNIQUE: Multiplanar and multiecho pulse sequences of the cervical spine, to
include the craniocervical junction and cervicothoracic junction,
were obtained according to standard protocol without and with
intravenous contrast.
CONTRAST:  20mL MULTIHANCE GADOBENATE DIMEGLUMINE 529 MG/ML IV SOLN

[Series 2: T1 · sagittal · 3.0mm · 0.43mm/px · 3 of 13 slices shown (1 of 2)]
[im 1/13]
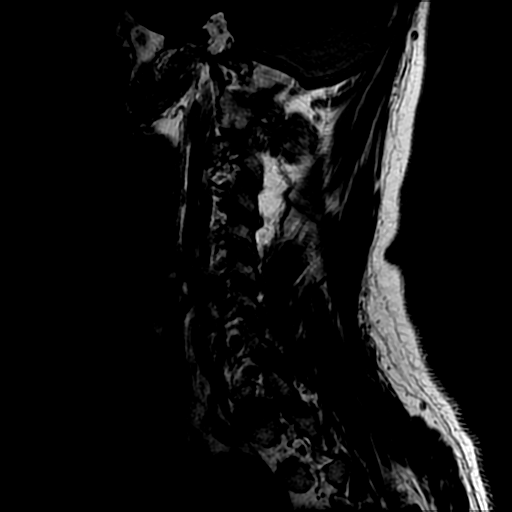
[im 7/13]
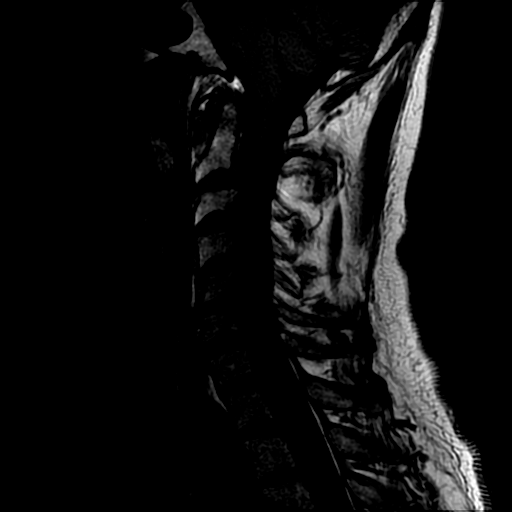
[im 13/13]
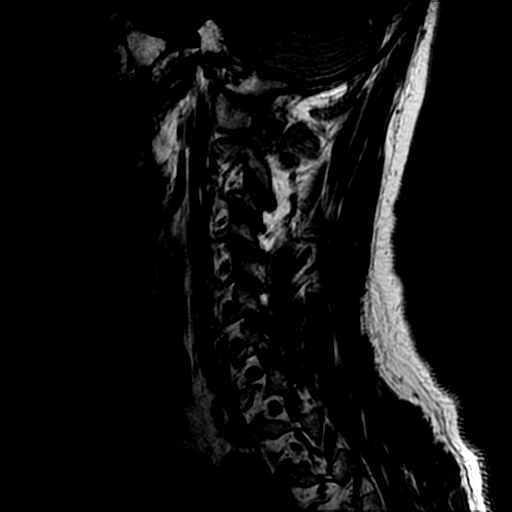

[Series 5: T2 · axial · 3.1mm · 0.35mm/px · z∈[-30,+85]mm · 9 of 33 slices shown]
[im 1/33]
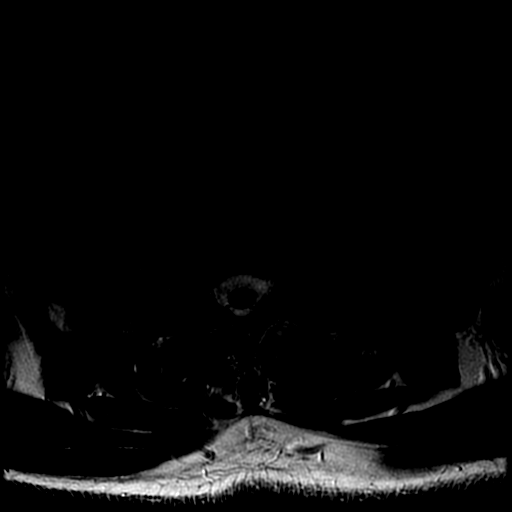
[im 5/33]
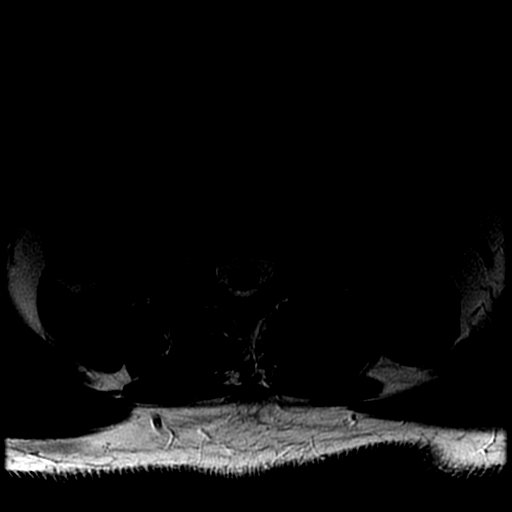
[im 9/33]
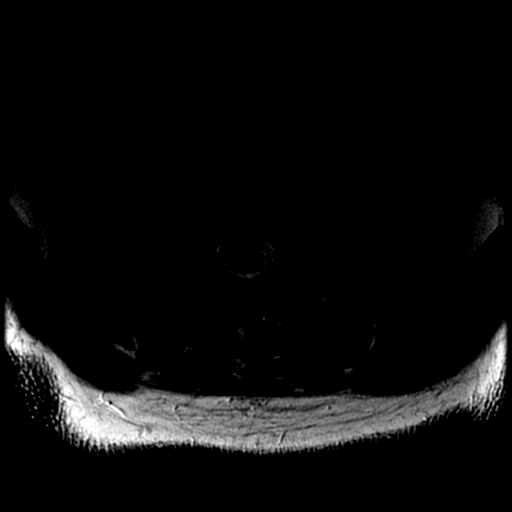
[im 13/33]
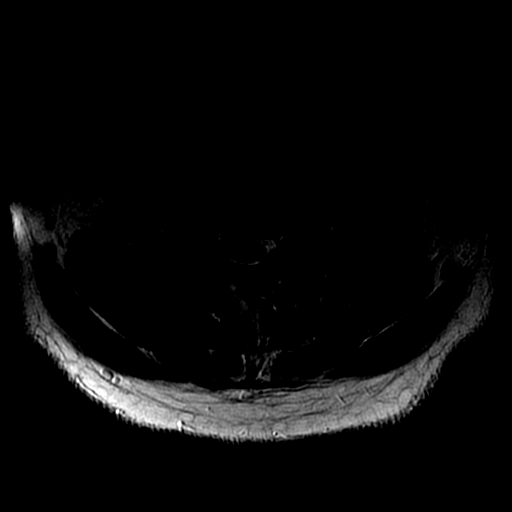
[im 17/33]
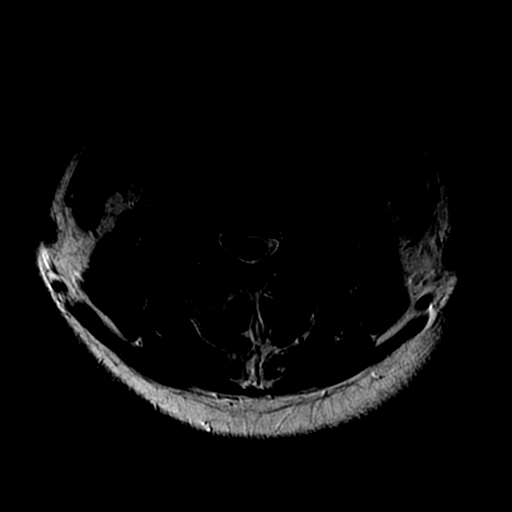
[im 21/33]
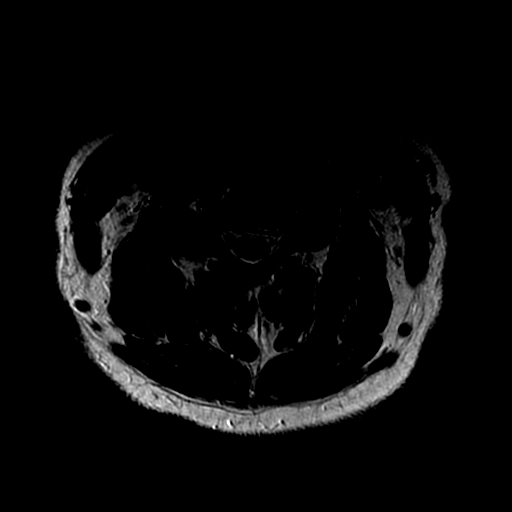
[im 25/33]
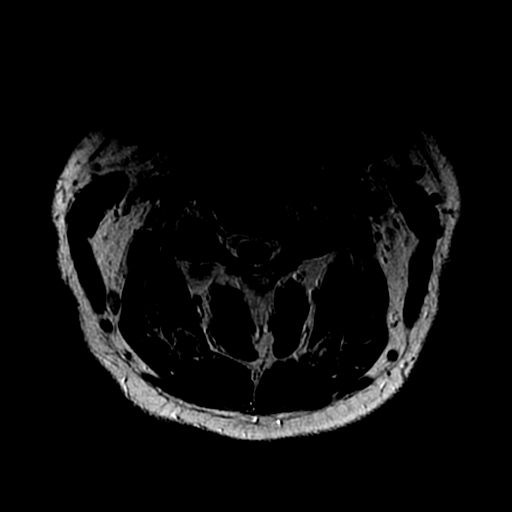
[im 29/33]
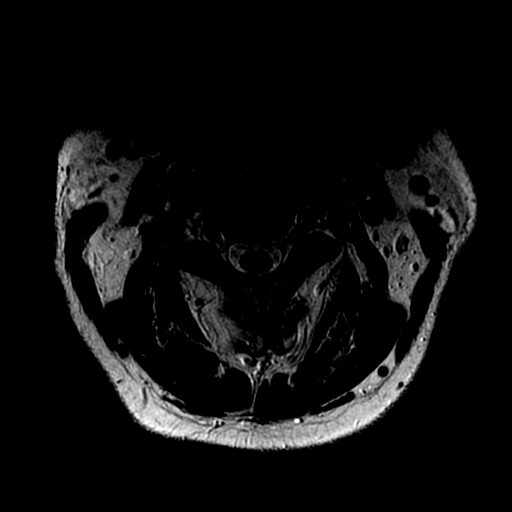
[im 33/33]
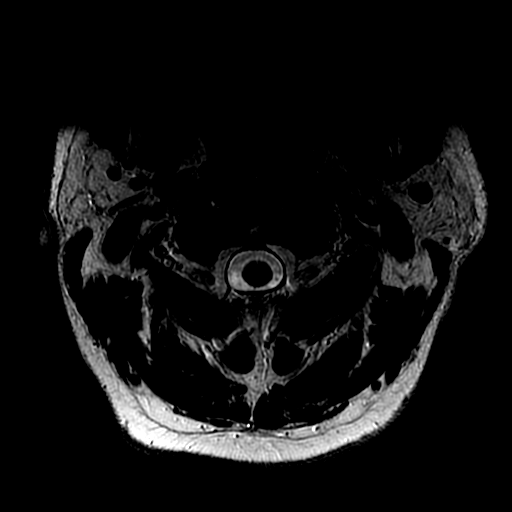

[Series 6: T1 · axial · non-contrast · 3.1mm · 0.35mm/px · z∈[-16,+70]mm · 3 of 33 slices shown (2 of 2)]
[im 5/33]
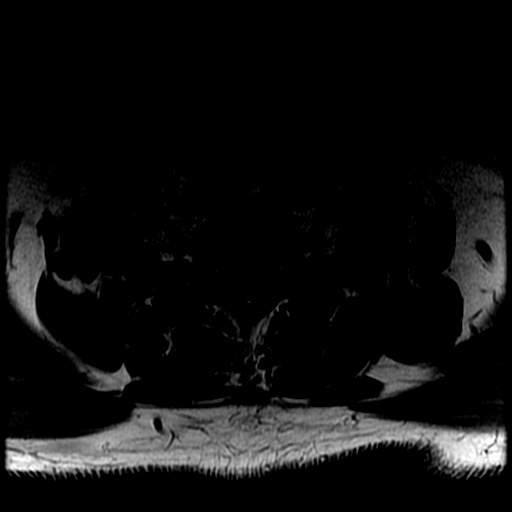
[im 17/33]
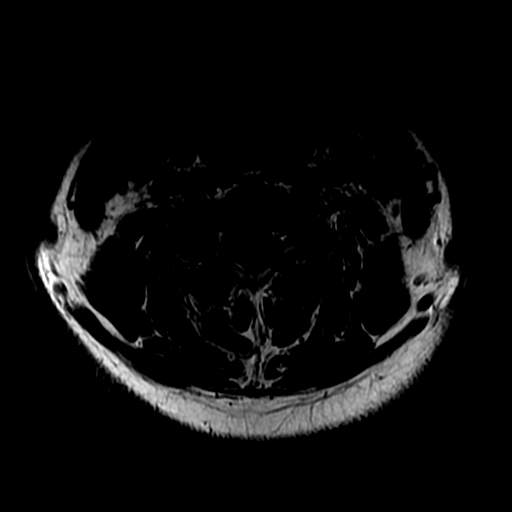
[im 29/33]
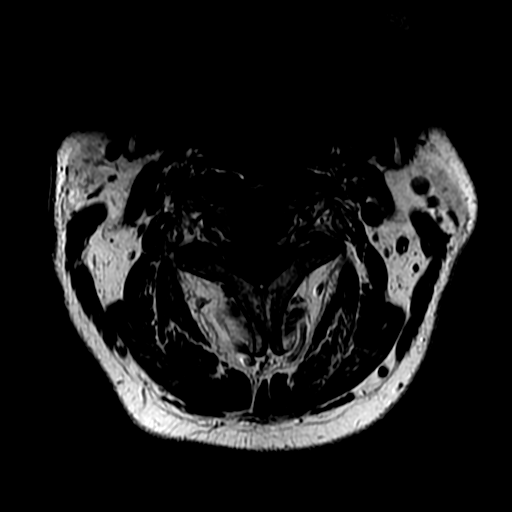

[Series 7: T2 post-contrast · sagittal · 3.0mm · 0.43mm/px · 3 of 13 slices shown]
[im 1/13]
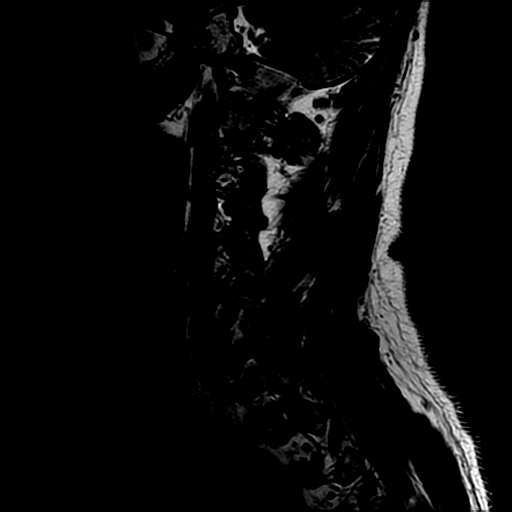
[im 7/13]
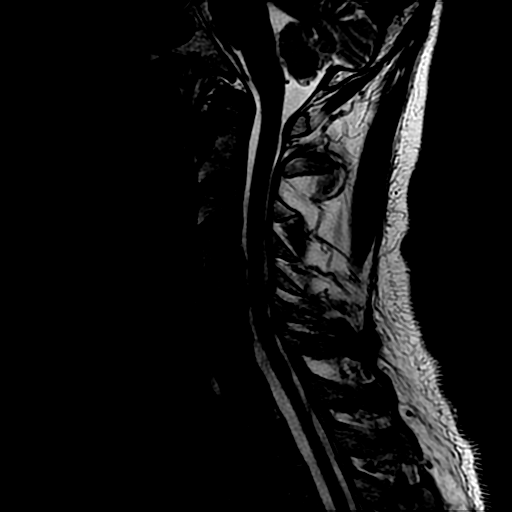
[im 13/13]
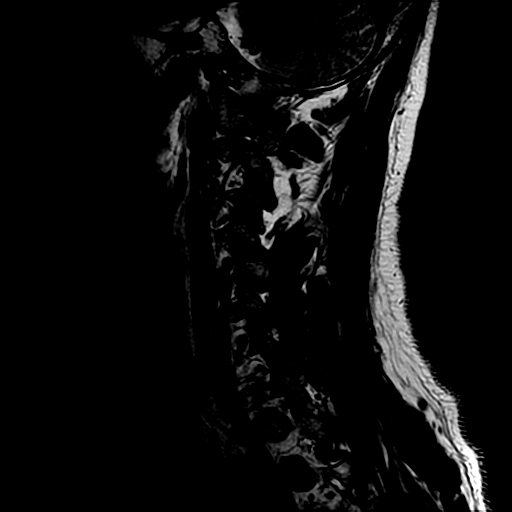

[18 of 48 positions shown; findings below may reference images not displayed]

FINDINGS: Evidence of chronic disc degeneration at C5-C6 which is felt to
explain abnormal bone marrow signal at that level on the basis of
degenerative marrow changes.

Visible bone marrow signal elsewhere is within normal limits.

No signal abnormality at the cervicomedullary junction or in the
visible posterior fossa. Spinal cord signal is within normal limits
at all visualized levels. No definite abnormal intradural
enhancement, normal vascular related enhancement suspected along the
ventral surface of the brainstem an the spinal cord.

Visualized posterior paraspinal soft tissues are within normal
limits.

The following superimposed degenerative changes are noted:

C2-C3:  Mild facet hypertrophy.

C3-C4: Mild facet and left greater than right uncovertebral
hypertrophy. Moderate to severe left C4 foraminal stenosis.

C4-C5: Facet and uncovertebral hypertrophy. Mild disc bulge. No
significant spinal stenosis. Mild to moderate bilateral C5 foraminal
stenosis.

C5-C6: Disc space loss. Left eccentric circumferential disc
osteophyte complex. Ligament flavum hypertrophy. Mild spinal
stenosis and flattening of the ventral spinal cord. No cord signal
abnormality. Severe bilateral C6 foraminal stenosis.

C6-C7: Circumferential disc osteophyte complex. Mild ligament flavum
hypertrophy. No significant spinal stenosis. Mild bilateral C7
foraminal stenosis.

C7-T1:  Facet hypertrophy.  No stenosis.

No upper thoracic spinal stenosis.
IMPRESSION: 1. No definite metastatic process in the cervical spine. Suspect
marrow signal changes in the C5-C6 levels is degenerative in nature.
Suspect normal vascular related enhancement along the surface of the
spinal cord.
2. Degenerative multifactorial mild spinal stenosis and severe
biforaminal stenosis is C5-C6.

## 2016-04-08 ENCOUNTER — Other Ambulatory Visit: Payer: Self-pay | Admitting: Oncology

## 2016-04-09 ENCOUNTER — Telehealth: Payer: Self-pay | Admitting: Pharmacist

## 2016-04-09 NOTE — Telephone Encounter (Signed)
04/09/16: Attempted to reach patient for follow up on oral medication: Cabometyx. No answer. Left VM for patient to call back with any questions or issues.   Thank you,  Montel Clock, PharmD, [facility] [phone]

## 2016-04-10 ENCOUNTER — Telehealth: Payer: Self-pay | Admitting: Oncology

## 2016-04-10 NOTE — Telephone Encounter (Signed)
Gave and printed appt sched and avs for pt for April °

## 2016-04-13 DIAGNOSIS — C7800 Secondary malignant neoplasm of unspecified lung: Secondary | ICD-10-CM | POA: Diagnosis not present

## 2016-04-15 ENCOUNTER — Other Ambulatory Visit: Payer: Self-pay | Admitting: *Deleted

## 2016-04-15 DIAGNOSIS — C649 Malignant neoplasm of unspecified kidney, except renal pelvis: Secondary | ICD-10-CM

## 2016-04-15 DIAGNOSIS — C7800 Secondary malignant neoplasm of unspecified lung: Secondary | ICD-10-CM | POA: Diagnosis not present

## 2016-04-15 MED ORDER — CABOZANTINIB S-MALATE 60 MG PO TABS: 60.0000 mg | ORAL_TABLET | Freq: Every day | ORAL | Status: DC

## 2016-04-16 ENCOUNTER — Telehealth: Payer: Self-pay | Admitting: Oncology

## 2016-04-16 ENCOUNTER — Other Ambulatory Visit (HOSPITAL_BASED_OUTPATIENT_CLINIC_OR_DEPARTMENT_OTHER): Payer: Medicare Other

## 2016-04-16 ENCOUNTER — Ambulatory Visit (HOSPITAL_BASED_OUTPATIENT_CLINIC_OR_DEPARTMENT_OTHER): Payer: Medicare Other | Admitting: Oncology

## 2016-04-16 VITALS — BP 138/91 | HR 70 | Temp 97.5°F | Resp 18 | Ht 75.0 in | Wt 194.5 lb

## 2016-04-16 DIAGNOSIS — C7889 Secondary malignant neoplasm of other digestive organs: Secondary | ICD-10-CM | POA: Diagnosis not present

## 2016-04-16 DIAGNOSIS — F419 Anxiety disorder, unspecified: Secondary | ICD-10-CM

## 2016-04-16 DIAGNOSIS — C7800 Secondary malignant neoplasm of unspecified lung: Secondary | ICD-10-CM | POA: Diagnosis not present

## 2016-04-16 DIAGNOSIS — M792 Neuralgia and neuritis, unspecified: Secondary | ICD-10-CM

## 2016-04-16 DIAGNOSIS — C7951 Secondary malignant neoplasm of bone: Secondary | ICD-10-CM

## 2016-04-16 DIAGNOSIS — C649 Malignant neoplasm of unspecified kidney, except renal pelvis: Secondary | ICD-10-CM

## 2016-04-16 DIAGNOSIS — K8689 Other specified diseases of pancreas: Secondary | ICD-10-CM

## 2016-04-16 DIAGNOSIS — K611 Rectal abscess: Secondary | ICD-10-CM

## 2016-04-16 DIAGNOSIS — R197 Diarrhea, unspecified: Secondary | ICD-10-CM

## 2016-04-16 DIAGNOSIS — I2699 Other pulmonary embolism without acute cor pulmonale: Secondary | ICD-10-CM

## 2016-04-16 LAB — COMPREHENSIVE METABOLIC PANEL
ALT: 37 U/L (ref 0–55)
AST: 26 U/L (ref 5–34)
Albumin: 3.3 g/dL — ABNORMAL LOW (ref 3.5–5.0)
Alkaline Phosphatase: 87 U/L (ref 40–150)
Anion Gap: 6 mEq/L (ref 3–11)
BUN: 7.4 mg/dL (ref 7.0–26.0)
CO2: 31 mEq/L — ABNORMAL HIGH (ref 22–29)
Calcium: 9 mg/dL (ref 8.4–10.4)
Chloride: 103 mEq/L (ref 98–109)
Creatinine: 1 mg/dL (ref 0.7–1.3)
EGFR: 85 mL/min/{1.73_m2} — ABNORMAL LOW (ref >90)
Glucose: 130 mg/dl (ref 70–140)
Potassium: 3.8 mEq/L (ref 3.5–5.1)
Sodium: 140 mEq/L (ref 136–145)
Total Bilirubin: <0.3 mg/dL (ref 0.20–1.20)
Total Protein: 7 g/dL (ref 6.4–8.3)

## 2016-04-16 LAB — CBC WITH DIFFERENTIAL/PLATELET
BASO%: 0.4 % (ref 0.0–2.0)
Basophils Absolute: 0 10*3/uL (ref 0.0–0.1)
EOS%: 5.2 % (ref 0.0–7.0)
Eosinophils Absolute: 0.4 10*3/uL (ref 0.0–0.5)
HCT: 41 % (ref 38.4–49.9)
HGB: 13.5 g/dL (ref 13.0–17.1)
LYMPH%: 39.3 % (ref 14.0–49.0)
MCH: 33.7 pg — ABNORMAL HIGH (ref 27.2–33.4)
MCHC: 32.9 g/dL (ref 32.0–36.0)
MCV: 102.2 fL — ABNORMAL HIGH (ref 79.3–98.0)
MONO#: 0.3 10*3/uL (ref 0.1–0.9)
MONO%: 4.3 % (ref 0.0–14.0)
NEUT#: 3.4 10*3/uL (ref 1.5–6.5)
NEUT%: 50.8 % (ref 39.0–75.0)
Platelets: 146 10*3/uL (ref 140–400)
RBC: 4.01 10*6/uL — ABNORMAL LOW (ref 4.20–5.82)
RDW: 15.1 % — ABNORMAL HIGH (ref 11.0–14.6)
WBC: 6.7 10*3/uL (ref 4.0–10.3)
lymph#: 2.6 10*3/uL (ref 0.9–3.3)

## 2016-04-16 MED ORDER — DIPHENOXYLATE-ATROPINE 2.5-0.025 MG PO TABS: 1.0000 | ORAL_TABLET | Freq: Four times a day (QID) | ORAL | Status: DC | PRN

## 2016-04-16 MED ORDER — ALPRAZOLAM 1 MG PO TABS: 1.0000 mg | ORAL_TABLET | Freq: Three times a day (TID) | ORAL | Status: DC | PRN

## 2016-04-16 MED ORDER — DULOXETINE HCL 60 MG PO CPEP: 60.0000 mg | ORAL_CAPSULE | Freq: Two times a day (BID) | ORAL | Status: DC

## 2016-04-16 NOTE — Progress Notes (Signed)
Hematology and Oncology Follow Up Visit  Keith [name] [id number] 1966-09-25 50 y.o. 04/16/2016 10:42 AM  [name], MD  [name], M.D.  [name], MD  [name], MD    Principle Diagnosis: This is a 50 year old gentleman with stage IV renal cell carcinoma diagnosed in 2009.  Prior Therapy: 1. Status post laparoscopic radical nephrectomy.  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  2. Patient status post thoracotomy for a synchronous metastatic lung lesions done October 2009.  He had a lower lobe nodule, biopsy proven to be metastatic renal cell carcinoma.   3. Patient is status post stereotactic radiotherapy to pulmonary nodules in May of 2010. 4. He is S/P Sutent 50 mg 4 weeks on 2 weeks off from 10/2010 to 03/2013. He progressed at that time.  5. He is S/P radiation to the right sacral bone between 4/22 to 4/30.  6. He is S/P XRT to the left shoulder 03/20/14 to 03/31/14. 7. Votrient 800 mg by mouth daily from 03/2013 through 06/22/2015. Discontinued secondary to disease progression. 8. Nivolumab 3 mg/kg given every 2 weeks started on 06/29/2015. He is status post 4 cycles completed 08/10/2015. He developed disease progression in September 2016.  9. Status post radiation therapy to the left mid fibula completed on 11/14/2015. He received a grade 1 fraction.    Current therapy:  Cabometyx 60 mg daily started in November 2016.   Interim History:  Keith [name] presents today for a followup visit. Since his last visit, he reports feeling relatively well without any major changes.  He tolerated Cabometyx without any recent complications. He is reporting worsening diarrhea in the last week. He has reported loose bowel habits after each meal although he tried Imodium but appears to be inadequate. He reports takes Imodium twice a day and does not stop his diarrhea. He denied any hand-foot syndrome, fatigue or other complications. His quality of life  continues to improve on this therapy. His mood is stable and his anxiety have been reasonably controlled. He remains active and attends to activities of daily living regular basis. He is able to ambulate without any difficulties.   He denied any respiratory symptoms at this time. He has not reported any cough or hemoptysis. He continues to use Lovenox without any issues. He denied any bleeding or thrombosis episode.  He did not report any headaches blurred vision or double vision. Does not report any seizure activity or alteration of mental status. Does not report any abdominal pain or hematochezia. Does not report any hematuria but does report occasional hesitancy and nocturia. He does not report any rashes or lesions or petechiae. He does not report any lymphadenopathy.  Remainder of his review of system is unremarkable.  Medications: I have reviewed the patient's current medications.  Current Outpatient Prescriptions  Medication Sig Dispense Refill  . ALPRAZolam (XANAX) 1 MG tablet Take 1 tablet (1 mg total) by mouth 3 (three) times daily as needed for anxiety. 90 tablet 0  . cabozantinib S-Malate (CABOMETYX) 60 MG TABS Take 60 mg by mouth daily. 30 tablet 0  . calcium carbonate (TUMS - DOSED IN MG ELEMENTAL CALCIUM) 500 MG chewable tablet Chew 1 tablet by mouth as needed for indigestion or heartburn.    . diphenhydrAMINE (BENADRYL) 25 mg capsule Take 25 mg by mouth every 6 (six) hours as needed for itching.    . DULoxetine (CYMBALTA) 60 MG capsule Take 1 capsule (60 mg total) by mouth 2 (  two) times daily. 60 capsule 1  . enoxaparin (LOVENOX) 150 MG/ML injection INJECT 0.93 MLS (140 MG TOTAL) INTO THE SKIN DAILY. 30 Syringe 1  . feeding supplement, ENSURE ENLIVE, (ENSURE ENLIVE) LIQD Take 237 mLs by mouth 2 (two) times daily between meals. 237 mL 12  . gabapentin (NEURONTIN) 300 MG capsule Take 1 capsule (300 mg total) by mouth 3 (three) times daily. 90 capsule 3  . HYDROmorphone (DILAUDID) 8 MG  tablet Take 1 tablet (8 mg total) by mouth every 4 (four) hours as needed for moderate pain or severe pain. 50 tablet 0  . ibuprofen (ADVIL,MOTRIN) 200 MG tablet Take 400 mg by mouth every 6 (six) hours as needed for fever, headache, moderate pain or cramping.    . insulin glargine (LANTUS) 100 UNIT/ML injection Inject 26 Units into the skin at bedtime.    [name] lactulose (CHRONULAC) 10 GM/15ML solution Take 30 mLs (20 g total) by mouth 2 (two) times daily. 240 mL 0  . metoprolol tartrate (LOPRESSOR) 25 MG tablet Take 1 tablet (25 mg total) by mouth 2 (two) times daily. 60 tablet 0  . senna-docusate (SENOKOT-S) 8.6-50 MG tablet Take 1 tablet by mouth 2 (two) times daily. 30 tablet 0  . tiZANidine (ZANAFLEX) 4 MG tablet Take 4 mg by mouth every 8 (eight) hours as needed for muscle spasms.   0  . VIAGRA 100 MG tablet Take 50 mg by mouth as needed for erectile dysfunction.     . diphenoxylate-atropine (LOMOTIL) 2.5-0.025 MG tablet Take 1 tablet by mouth 4 (four) times daily as needed for diarrhea or loose stools. 30 tablet 0   No current facility-administered medications for this visit.     Allergies:  Allergies  Allergen Reactions  . Ceftriaxone Hives  . Hydrocodone Swelling    Past Medical History, Surgical history, Social history, and Family History were reviewed and updated.   Physical Exam: Blood pressure 138/91, pulse 70, temperature 97.5 F (36.4 C), temperature source Oral, resp. rate 18, height '6\' 3"'$  (1.905 m), weight 194 lb 8 oz (88.225 kg), SpO2 100 %. ECOG: 1 General appearance: Alert, awake gentleman without distress. Head: Normocephalic, without obvious abnormality. No oral ulcers or lesions. Neck: no adenopathy, no masses.  Lymph nodes: Cervical, supraclavicular, and axillary nodes normal. Heart:regular rate and rhythm, S1, S2.  Lung: Clear throughout auscultation. No wheezes noted. Abdomen: soft, non-tender, without masses or organomegaly no rebound or guarding. EXT:   Tender to touch on the lateral side of his left lower extremity. Neuro: No focal deficits noted.   Lab Results: Lab Results  Component Value Date   WBC 6.7 04/16/2016   HGB 13.5 04/16/2016   HCT 41.0 04/16/2016   MCV 102.2* 04/16/2016   PLT 146 04/16/2016     Impression and Plan:  This is a pleasant 50 year old gentleman with the following issues. 1. Metastatic renal cell carcinoma.  He has documented disease to the lung and the pancreas. He is status post multiple therapies outlined above and recently progressed in October 2016. He is currently on Cabometyx and have tolerated it well. CT scan on 02/07/2017showed an excellent response to therapy.The plan is to continue with the same dose and schedule and repeat imaging studies in 2 months. I might reduce the dose if his diarrhea becomes an issue. He is comfortable with the current dose and feels that his diarrhea could be manageable. 2.  Depression/Anxiety: He remains on Xanax and Cymbalta. His mood is much improved. I have refilled his  Xanax a and Cymbalta today. 3. Weight loss: improved at this time with a reasonable appetite.  4. Neuropathic pain. He is on gabapentin and Dilaudid for pain. He also has a subcutaneous pain medication pump. No changes since the last visit. 5. Left lower extremity pain and a mass: Much improved after radiation therapy. 6. Hypertension: Controlled at this time. No medication is needed at this time. 7. Pulmonary embolism: He is currently on Lovenox without any recent complications of bleeding or thrombosis. He has no objections to continuing. 8. Prognosis: He does have an incurable malignancy however he responded well to treatment that the plan is to continue with this palliative approach.. 9. Diarrhea: I gave him clear instructions how to manage his diarrhea including take an Imodium after each loose bowel movements. If he has 3 or more despite the Imodium, I gave a prescription for Lomotil to use as  well. 10. Follow up: in 4 weeks.   [facility], MD 04/16/2016

## 2016-04-16 NOTE — Telephone Encounter (Signed)
Pt said requested date & time per pof does not work for him.... Pt requested a morning on any day but Friday.Keith [name] pt appt & avs

## 2016-04-17 ENCOUNTER — Other Ambulatory Visit: Payer: Self-pay | Admitting: Oncology

## 2016-05-05 NOTE — Addendum Note (Signed)
Addendum  created 05/05/16 0816 by Izora Gala, CRNA   Modules edited: Notes Section   Notes Section:  Delete: [id number]

## 2016-05-06 ENCOUNTER — Telehealth: Payer: Self-pay | Admitting: Pharmacist

## 2016-05-06 NOTE — Telephone Encounter (Signed)
Attempted to contact pt by phone re: Cabometyx.  No answer on mobile phone. Will try again tomorrow.  If he is doing well, we can consider d/c phone f/u. Kennith Center, Pharm.D., CPP 05/06/2016'@4'$ :13 PM [facility]

## 2016-05-07 ENCOUNTER — Encounter: Payer: Self-pay | Admitting: Pharmacist

## 2016-05-07 NOTE — Progress Notes (Signed)
05/07/16: called patient to discuss oral agent: Cabometyx. Unable to reach patient. Left voicemail for patient to call back with any issues. Patient has been stable on Cabometyx since the end of October. Will will discharge Mr. Smelser from follow up calls. He can call anytime with issues.   Thank you,  Montel Clock, PharmD, [facility]

## 2016-05-13 DIAGNOSIS — C7800 Secondary malignant neoplasm of unspecified lung: Secondary | ICD-10-CM | POA: Diagnosis not present

## 2016-05-14 ENCOUNTER — Other Ambulatory Visit: Payer: Self-pay | Admitting: Oncology

## 2016-05-15 ENCOUNTER — Other Ambulatory Visit: Payer: Self-pay | Admitting: *Deleted

## 2016-05-15 DIAGNOSIS — C649 Malignant neoplasm of unspecified kidney, except renal pelvis: Secondary | ICD-10-CM

## 2016-05-15 DIAGNOSIS — C7800 Secondary malignant neoplasm of unspecified lung: Secondary | ICD-10-CM | POA: Diagnosis not present

## 2016-05-15 MED ORDER — CABOZANTINIB S-MALATE 60 MG PO TABS: 60.0000 mg | ORAL_TABLET | Freq: Every day | ORAL | Status: DC

## 2016-05-21 ENCOUNTER — Telehealth: Payer: Self-pay | Admitting: Oncology

## 2016-05-21 ENCOUNTER — Other Ambulatory Visit (HOSPITAL_BASED_OUTPATIENT_CLINIC_OR_DEPARTMENT_OTHER): Payer: Medicare Other

## 2016-05-21 ENCOUNTER — Ambulatory Visit (HOSPITAL_BASED_OUTPATIENT_CLINIC_OR_DEPARTMENT_OTHER): Payer: Medicare Other | Admitting: Oncology

## 2016-05-21 VITALS — BP 136/96 | HR 69 | Temp 97.6°F | Resp 18 | Ht 75.0 in | Wt 193.4 lb

## 2016-05-21 DIAGNOSIS — C7951 Secondary malignant neoplasm of bone: Secondary | ICD-10-CM | POA: Diagnosis not present

## 2016-05-21 DIAGNOSIS — F419 Anxiety disorder, unspecified: Secondary | ICD-10-CM

## 2016-05-21 DIAGNOSIS — K611 Rectal abscess: Secondary | ICD-10-CM

## 2016-05-21 DIAGNOSIS — C649 Malignant neoplasm of unspecified kidney, except renal pelvis: Secondary | ICD-10-CM

## 2016-05-21 DIAGNOSIS — C7889 Secondary malignant neoplasm of other digestive organs: Secondary | ICD-10-CM | POA: Diagnosis not present

## 2016-05-21 DIAGNOSIS — K8689 Other specified diseases of pancreas: Secondary | ICD-10-CM

## 2016-05-21 LAB — COMPREHENSIVE METABOLIC PANEL
ALT: 38 U/L (ref 0–55)
AST: 22 U/L (ref 5–34)
Albumin: 3.2 g/dL — ABNORMAL LOW (ref 3.5–5.0)
Alkaline Phosphatase: 98 U/L (ref 40–150)
Anion Gap: 9 mEq/L (ref 3–11)
BUN: 7.3 mg/dL (ref 7.0–26.0)
CO2: 27 mEq/L (ref 22–29)
Calcium: 8.7 mg/dL (ref 8.4–10.4)
Chloride: 105 mEq/L (ref 98–109)
Creatinine: 1.1 mg/dL (ref 0.7–1.3)
EGFR: 80 mL/min/{1.73_m2} — ABNORMAL LOW (ref >90)
Glucose: 96 mg/dl (ref 70–140)
Potassium: 3.8 mEq/L (ref 3.5–5.1)
Sodium: 141 mEq/L (ref 136–145)
Total Bilirubin: <0.3 mg/dL (ref 0.20–1.20)
Total Protein: 6.8 g/dL (ref 6.4–8.3)

## 2016-05-21 LAB — CBC WITH DIFFERENTIAL/PLATELET
BASO%: 0.8 % (ref 0.0–2.0)
Basophils Absolute: 0 10*3/uL (ref 0.0–0.1)
EOS%: 7 % (ref 0.0–7.0)
Eosinophils Absolute: 0.4 10*3/uL (ref 0.0–0.5)
HCT: 41.5 % (ref 38.4–49.9)
HGB: 13.6 g/dL (ref 13.0–17.1)
LYMPH%: 40.3 % (ref 14.0–49.0)
MCH: 33.3 pg (ref 27.2–33.4)
MCHC: 32.7 g/dL (ref 32.0–36.0)
MCV: 101.6 fL — ABNORMAL HIGH (ref 79.3–98.0)
MONO#: 0.3 10*3/uL (ref 0.1–0.9)
MONO%: 5.4 % (ref 0.0–14.0)
NEUT#: 2.4 10*3/uL (ref 1.5–6.5)
NEUT%: 46.5 % (ref 39.0–75.0)
Platelets: 141 10*3/uL (ref 140–400)
RBC: 4.08 10*6/uL — ABNORMAL LOW (ref 4.20–5.82)
RDW: 16.2 % — ABNORMAL HIGH (ref 11.0–14.6)
WBC: 5.1 10*3/uL (ref 4.0–10.3)
lymph#: 2.1 10*3/uL (ref 0.9–3.3)

## 2016-05-21 MED ORDER — ALPRAZOLAM 1 MG PO TABS
1.0000 mg | ORAL_TABLET | Freq: Three times a day (TID) | ORAL | Status: DC | PRN
Start: 2016-05-21 — End: 2016-06-18

## 2016-05-21 MED ORDER — DULOXETINE HCL 60 MG PO CPEP
60.0000 mg | ORAL_CAPSULE | Freq: Two times a day (BID) | ORAL | Status: DC
Start: 2016-05-21 — End: 2016-07-16

## 2016-05-21 NOTE — Telephone Encounter (Signed)
Gave and printed appt sched and avs for pt for June...gv barium

## 2016-05-21 NOTE — Progress Notes (Signed)
Hematology and Oncology Follow Up Visit  Keith [name] [id number] March 14, 1966 50 y.o. 05/21/2016 8:28 AM  Keith Bring, MD  Keith [name], M.D.  Keith Bent, MD  Keith Banister, MD    Principle Diagnosis: This is a 50 year old gentleman with stage IV renal cell carcinoma diagnosed in 2009.  Prior Therapy: 1. Status post laparoscopic radical nephrectomy.  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  2. Patient status post thoracotomy for a synchronous metastatic lung lesions done October 2009.  He had a lower lobe nodule, biopsy proven to be metastatic renal cell carcinoma.   3. Patient is status post stereotactic radiotherapy to pulmonary nodules in May of 2010. 4. He is S/P Sutent 50 mg 4 weeks on 2 weeks off from 10/2010 to 03/2013. He progressed at that time.  5. He is S/P radiation to the right sacral bone between 4/22 to 4/30.  6. He is S/P XRT to the left shoulder 03/20/14 to 03/31/14. 7. Votrient 800 mg by mouth daily from 03/2013 through 06/22/2015. Discontinued secondary to disease progression. 8. Nivolumab 3 mg/kg given every 2 weeks started on 06/29/2015. He is status post 4 cycles completed 08/10/2015. He developed disease progression in September 2016.  9. Status post radiation therapy to the left mid fibula completed on 11/14/2015. He received a grade 1 fraction.    Current therapy:  Cabometyx 60 mg daily started in November 2016.   Interim History:  Keith [name] presents today for a followup visit. Since his last visit, he continues to feel well and reports clinical improvement. He tolerated Cabometyx without any recent complications. He is reporting occasional diarrhea which is manageable at this time. He reports takes Imodium as needed and have been successful in controlling his loose bowel habits. He denied any hand-foot syndrome, fatigue or other complications. His quality of life continues to improve on this therapy. He remains active and  attends to activities of daily living regular basis. He is able to ambulate without any difficulties.   He denied any respiratory symptoms at this time. He has not reported any cough or hemoptysis. He continues to use Lovenox without any issues. He denied any bleeding or thrombosis episode.  He continues to have pain in his lower tissue medially on the left side but still able to ambulate. He is taking Dilaudid for pain which have helped his symptoms.  He did not report any headaches blurred vision or double vision. Does not report any seizure activity or alteration of mental status. Does not report any abdominal pain or hematochezia. Does not report any hematuria but does report occasional hesitancy and nocturia. He does not report any rashes or lesions or petechiae. He does not report any lymphadenopathy.  Remainder of his review of system is unremarkable.  Medications: I have reviewed the patient's current medications.  Current Outpatient Prescriptions  Medication Sig Dispense Refill  . ALPRAZolam (XANAX) 1 MG tablet Take 1 tablet (1 mg total) by mouth 3 (three) times daily as needed for anxiety. 90 tablet 0  . cabozantinib S-Malate (CABOMETYX) 60 MG TABS Take 60 mg by mouth daily. Take one tablet by mouth daily with at least 8 ounces of water on an empty stomach. Do not eat for 2 hours before or 1 hour after. Swallow whole. 30 tablet 0  . calcium carbonate (TUMS - DOSED IN MG ELEMENTAL CALCIUM) 500 MG chewable tablet Chew 1 tablet by mouth as needed for indigestion or heartburn.    [name]  diphenhydrAMINE (BENADRYL) 25 mg capsule Take 25 mg by mouth every 6 (six) hours as needed for itching.    . diphenoxylate-atropine (LOMOTIL) 2.5-0.025 MG tablet Take 1 tablet by mouth 4 (four) times daily as needed for diarrhea or loose stools. 30 tablet 0  . DULoxetine (CYMBALTA) 60 MG capsule Take 1 capsule (60 mg total) by mouth 2 (two) times daily. 60 capsule 0  . enoxaparin (LOVENOX) 150 MG/ML injection INJECT  0.93 MLS (140 MG TOTAL) INTO THE SKIN DAILY. 30 Syringe 1  . feeding supplement, ENSURE ENLIVE, (ENSURE ENLIVE) LIQD Take 237 mLs by mouth 2 (two) times daily between meals. 237 mL 12  . gabapentin (NEURONTIN) 300 MG capsule Take 1 capsule (300 mg total) by mouth 3 (three) times daily. 90 capsule 3  . HYDROmorphone (DILAUDID) 8 MG tablet Take 1 tablet (8 mg total) by mouth every 4 (four) hours as needed for moderate pain or severe pain. 50 tablet 0  . ibuprofen (ADVIL,MOTRIN) 200 MG tablet Take 400 mg by mouth every 6 (six) hours as needed for fever, headache, moderate pain or cramping.    . insulin glargine (LANTUS) 100 UNIT/ML injection Inject 26 Units into the skin at bedtime.    [name] lactulose (CHRONULAC) 10 GM/15ML solution Take 30 mLs (20 g total) by mouth 2 (two) times daily. 240 mL 0  . senna-docusate (SENOKOT-S) 8.6-50 MG tablet Take 1 tablet by mouth 2 (two) times daily. 30 tablet 0  . tiZANidine (ZANAFLEX) 4 MG tablet Take 4 mg by mouth every 8 (eight) hours as needed for muscle spasms.   0  . VIAGRA 100 MG tablet Take 50 mg by mouth as needed for erectile dysfunction.     . metoprolol tartrate (LOPRESSOR) 25 MG tablet Take 1 tablet (25 mg total) by mouth 2 (two) times daily. (Patient not taking: Reported on 05/21/2016) 60 tablet 0   No current facility-administered medications for this visit.     Allergies:  Allergies  Allergen Reactions  . Ceftriaxone Hives  . Hydrocodone Swelling    Past Medical History, Surgical history, Social history, and Family History were reviewed and updated.   Physical Exam: Blood pressure 136/96, pulse 69, temperature 97.6 F (36.4 C), temperature source Oral, resp. rate 18, height '6\' 3"'$  (1.905 m), weight 193 lb 6.4 oz (87.726 kg), SpO2 100 %. ECOG: 1 General appearance: Alert, pleasant gentleman without distress. Head: Normocephalic, without obvious abnormality. No oral thrush noted. Neck: no adenopathy, no masses.  Lymph nodes: Cervical,  supraclavicular, and axillary nodes normal. Heart:regular rate and rhythm, S1, S2.  Lung: Clear throughout auscultation. No wheezes noted. Abdomen: soft, non-tender, without masses or organomegaly no shifting dullness or ascites. EXT:  Tender to touch on the lateral side of his left lower extremity. Unchanged from previous examination. Neuro: No focal deficits noted.   Lab Results: Lab Results  Component Value Date   WBC 5.1 05/21/2016   HGB 13.6 05/21/2016   HCT 41.5 05/21/2016   MCV 101.6* 05/21/2016   PLT 141 05/21/2016     Impression and Plan:  This is a pleasant 50 year old gentleman with the following issues. 1. Metastatic renal cell carcinoma.  He has documented disease to the lung and the pancreas. He is status post multiple therapies. He is currently on Cabometyx and have tolerated it well. CT scan on 02/07/2017showed an excellent response to therapy. His quality of life and performance status have improved dramatically on this therapy in addition to her radiographic response. The plan is to  continue with the same dose and schedule and repeat imaging studies for the next visit in June 2017. 2. Depression/Anxiety: He remains on Xanax and Cymbalta. His mood is much improved. These will be refilled today. 3. Weight loss: His appetite remains stable and his weight is unchanged.sit. 4. Left lower extremity pain and a mass: Much improved after radiation therapy. Still requires Dilaudid as needed to control that pain although it appears to be manageable. 5. Hypertension: Controlled at this time. No medication is needed at this time. 6. Pulmonary embolism: He is currently on Lovenox without any recent complications of bleeding or thrombosis. He has no objections to continuing. 7. Prognosis: He does have an incurable malignancy however he responded well to treatment that the plan is to continue with this palliative approach.. 8. Diarrhea: Improved with Imodium and Lomotil at this  time. 9. Follow up: in 4 weeks.   [facility], MD 05/21/2016

## 2016-06-09 ENCOUNTER — Other Ambulatory Visit: Payer: Self-pay | Admitting: Oncology

## 2016-06-13 DIAGNOSIS — C7800 Secondary malignant neoplasm of unspecified lung: Secondary | ICD-10-CM | POA: Diagnosis not present

## 2016-06-15 DIAGNOSIS — C7800 Secondary malignant neoplasm of unspecified lung: Secondary | ICD-10-CM | POA: Diagnosis not present

## 2016-06-16 ENCOUNTER — Other Ambulatory Visit: Payer: Self-pay | Admitting: *Deleted

## 2016-06-16 DIAGNOSIS — C649 Malignant neoplasm of unspecified kidney, except renal pelvis: Secondary | ICD-10-CM

## 2016-06-16 MED ORDER — CABOZANTINIB S-MALATE 60 MG PO TABS: 60.0000 mg | ORAL_TABLET | Freq: Every day | ORAL | Status: DC

## 2016-06-17 ENCOUNTER — Other Ambulatory Visit (HOSPITAL_BASED_OUTPATIENT_CLINIC_OR_DEPARTMENT_OTHER): Payer: Medicare Other

## 2016-06-17 ENCOUNTER — Ambulatory Visit (HOSPITAL_COMMUNITY)
Admission: RE | Admit: 2016-06-17 | Discharge: 2016-06-17 | Disposition: A | Discharge status: Home / Self Care | Payer: Medicare Other | Source: Ambulatory Visit | Attending: Oncology | Admitting: Oncology

## 2016-06-17 ENCOUNTER — Encounter (HOSPITAL_COMMUNITY): Payer: Self-pay

## 2016-06-17 ENCOUNTER — Other Ambulatory Visit: Payer: Medicare Other

## 2016-06-17 ENCOUNTER — Other Ambulatory Visit: Payer: Self-pay | Admitting: Oncology

## 2016-06-17 DIAGNOSIS — J439 Emphysema, unspecified: Secondary | ICD-10-CM | POA: Insufficient documentation

## 2016-06-17 DIAGNOSIS — C649 Malignant neoplasm of unspecified kidney, except renal pelvis: Secondary | ICD-10-CM

## 2016-06-17 DIAGNOSIS — K760 Fatty (change of) liver, not elsewhere classified: Secondary | ICD-10-CM | POA: Insufficient documentation

## 2016-06-17 DIAGNOSIS — K869 Disease of pancreas, unspecified: Secondary | ICD-10-CM | POA: Insufficient documentation

## 2016-06-17 DIAGNOSIS — C7951 Secondary malignant neoplasm of bone: Secondary | ICD-10-CM

## 2016-06-17 DIAGNOSIS — R918 Other nonspecific abnormal finding of lung field: Secondary | ICD-10-CM | POA: Diagnosis not present

## 2016-06-17 DIAGNOSIS — C642 Malignant neoplasm of left kidney, except renal pelvis: Secondary | ICD-10-CM | POA: Diagnosis not present

## 2016-06-17 DIAGNOSIS — I712 Thoracic aortic aneurysm, without rupture: Secondary | ICD-10-CM | POA: Diagnosis not present

## 2016-06-17 DIAGNOSIS — C7802 Secondary malignant neoplasm of left lung: Secondary | ICD-10-CM | POA: Diagnosis not present

## 2016-06-17 HISTORY — DX: Type 2 diabetes mellitus without complications: E11.9

## 2016-06-17 HISTORY — DX: Malignant (primary) neoplasm, unspecified: C80.1

## 2016-06-17 HISTORY — DX: Secondary malignant neoplasm of other digestive organs: C78.89

## 2016-06-17 HISTORY — DX: Malignant neoplasm of unspecified part of unspecified bronchus or lung: C34.90

## 2016-06-17 LAB — CBC WITH DIFFERENTIAL/PLATELET
BASO%: 1 % (ref 0.0–2.0)
Basophils Absolute: 0.1 10*3/uL (ref 0.0–0.1)
EOS%: 6.7 % (ref 0.0–7.0)
Eosinophils Absolute: 0.4 10*3/uL (ref 0.0–0.5)
HCT: 43.1 % (ref 38.4–49.9)
HGB: 14.1 g/dL (ref 13.0–17.1)
LYMPH%: 36.8 % (ref 14.0–49.0)
MCH: 33.5 pg — ABNORMAL HIGH (ref 27.2–33.4)
MCHC: 32.8 g/dL (ref 32.0–36.0)
MCV: 102.2 fL — ABNORMAL HIGH (ref 79.3–98.0)
MONO#: 0.3 10*3/uL (ref 0.1–0.9)
MONO%: 4.9 % (ref 0.0–14.0)
NEUT#: 2.9 10*3/uL (ref 1.5–6.5)
NEUT%: 50.6 % (ref 39.0–75.0)
Platelets: 165 10*3/uL (ref 140–400)
RBC: 4.21 10*6/uL (ref 4.20–5.82)
RDW: 16.3 % — ABNORMAL HIGH (ref 11.0–14.6)
WBC: 5.8 10*3/uL (ref 4.0–10.3)
lymph#: 2.1 10*3/uL (ref 0.9–3.3)

## 2016-06-17 LAB — COMPREHENSIVE METABOLIC PANEL
ALT: 49 U/L (ref 0–55)
AST: 32 U/L (ref 5–34)
Albumin: 3.6 g/dL (ref 3.5–5.0)
Alkaline Phosphatase: 104 U/L (ref 40–150)
Anion Gap: 6 mEq/L (ref 3–11)
BUN: 5 mg/dL — ABNORMAL LOW (ref 7.0–26.0)
CO2: 30 mEq/L — ABNORMAL HIGH (ref 22–29)
Calcium: 9.2 mg/dL (ref 8.4–10.4)
Chloride: 102 mEq/L (ref 98–109)
Creatinine: 1.1 mg/dL (ref 0.7–1.3)
EGFR: 77 mL/min/{1.73_m2} — ABNORMAL LOW (ref >90)
Glucose: 93 mg/dl (ref 70–140)
Potassium: 4.4 mEq/L (ref 3.5–5.1)
Sodium: 138 mEq/L (ref 136–145)
Total Bilirubin: <0.3 mg/dL (ref 0.20–1.20)
Total Protein: 7.5 g/dL (ref 6.4–8.3)

## 2016-06-17 MED ORDER — IOPAMIDOL (ISOVUE-300) INJECTION 61%
100.0000 mL | Freq: Once | INTRAVENOUS | Status: AC | PRN
  Administered 2016-06-17: 100 mL via INTRAVENOUS

## 2016-06-18 ENCOUNTER — Other Ambulatory Visit: Payer: Self-pay | Admitting: *Deleted

## 2016-06-18 ENCOUNTER — Ambulatory Visit (HOSPITAL_BASED_OUTPATIENT_CLINIC_OR_DEPARTMENT_OTHER): Payer: Medicare Other | Admitting: Oncology

## 2016-06-18 ENCOUNTER — Telehealth: Payer: Self-pay | Admitting: Oncology

## 2016-06-18 VITALS — BP 139/89 | HR 76 | Temp 97.7°F | Resp 20 | Ht 75.0 in | Wt 189.8 lb

## 2016-06-18 DIAGNOSIS — C649 Malignant neoplasm of unspecified kidney, except renal pelvis: Secondary | ICD-10-CM

## 2016-06-18 DIAGNOSIS — C7800 Secondary malignant neoplasm of unspecified lung: Secondary | ICD-10-CM | POA: Diagnosis not present

## 2016-06-18 DIAGNOSIS — R634 Abnormal weight loss: Secondary | ICD-10-CM

## 2016-06-18 DIAGNOSIS — R197 Diarrhea, unspecified: Secondary | ICD-10-CM

## 2016-06-18 DIAGNOSIS — F418 Other specified anxiety disorders: Secondary | ICD-10-CM

## 2016-06-18 DIAGNOSIS — M79605 Pain in left leg: Secondary | ICD-10-CM

## 2016-06-18 DIAGNOSIS — L989 Disorder of the skin and subcutaneous tissue, unspecified: Secondary | ICD-10-CM

## 2016-06-18 DIAGNOSIS — I2699 Other pulmonary embolism without acute cor pulmonale: Secondary | ICD-10-CM

## 2016-06-18 DIAGNOSIS — K8689 Other specified diseases of pancreas: Secondary | ICD-10-CM

## 2016-06-18 DIAGNOSIS — F419 Anxiety disorder, unspecified: Secondary | ICD-10-CM

## 2016-06-18 DIAGNOSIS — C7889 Secondary malignant neoplasm of other digestive organs: Secondary | ICD-10-CM | POA: Diagnosis not present

## 2016-06-18 DIAGNOSIS — C7951 Secondary malignant neoplasm of bone: Secondary | ICD-10-CM

## 2016-06-18 DIAGNOSIS — K611 Rectal abscess: Secondary | ICD-10-CM

## 2016-06-18 MED ORDER — ALPRAZOLAM 1 MG PO TABS: 1.0000 mg | ORAL_TABLET | Freq: Three times a day (TID) | ORAL | Status: DC | PRN

## 2016-06-18 NOTE — Progress Notes (Signed)
Hematology and Oncology Follow Up Visit  Keith [name] [id number] 1966/07/05 50 y.o. 06/18/2016 1:00 PM  Keith Bring, MD  Keith [name], M.D.  Keith Bent, MD  Keith Banister, MD    Principle Diagnosis: This is a 50 year old gentleman with stage IV renal cell carcinoma diagnosed in 2009.  Prior Therapy: 1. Status post laparoscopic radical nephrectomy.  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  2. Patient status post thoracotomy for a synchronous metastatic lung lesions done October 2009.  He had a lower lobe nodule, biopsy proven to be metastatic renal cell carcinoma.   3. Patient is status post stereotactic radiotherapy to pulmonary nodules in May of 2010. 4. He is S/P Sutent 50 mg 4 weeks on 2 weeks off from 10/2010 to 03/2013. He progressed at that time.  5. He is S/P radiation to the right sacral bone between 4/22 to 4/30.  6. He is S/P XRT to the left shoulder 03/20/14 to 03/31/14. 7. Votrient 800 mg by mouth daily from 03/2013 through 06/22/2015. Discontinued secondary to disease progression. 8. Nivolumab 3 mg/kg given every 2 weeks started on 06/29/2015. He is status post 4 cycles completed 08/10/2015. He developed disease progression in September 2016.  9. Status post radiation therapy to the left mid fibula completed on 11/14/2015. He received a grade 1 fraction.    Current therapy:  Cabometyx 60 mg daily started in November 2016.   Interim History:  Keith [name] presents today for a followup visit. Since his last visit, he reports few changes in his health including slight weight loss and mild diarrhea. He tolerated Cabometyx without any other complications. His diarrhea is manageable with Imodium and Lomotil.  He denied any hand-foot syndrome, fatigue or other complications. His quality of life remains stable and continues to be active. Her performs activities of daily living including work outside the house. He ambulates without any major  difficulties and continues to have chronic pain in his left leg.  He denied any respiratory symptoms at this time. He has not reported any cough or hemoptysis. He continues to use Lovenox without any issues. He denied any bleeding or thrombosis episode.   He did not report any headaches blurred vision or double vision. Does not report any seizure activity or alteration of mental status. Does not report any abdominal pain or hematochezia. Does not report any hematuria but does report occasional hesitancy and nocturia. He does not report any rashes or lesions or petechiae. He does not report any lymphadenopathy.  Remainder of his review of system is unremarkable.  Medications: I have reviewed the patient's current medications.  Current Outpatient Prescriptions  Medication Sig Dispense Refill  . cabozantinib S-Malate (CABOMETYX) 60 MG TABS Take 60 mg by mouth daily. Take one tablet by mouth daily with at least 8 ounces of water on an empty stomach. Do not eat for 2 hours before or 1 hour after. Swallow whole. 30 tablet 0  . calcium carbonate (TUMS - DOSED IN MG ELEMENTAL CALCIUM) 500 MG chewable tablet Chew 1 tablet by mouth as needed for indigestion or heartburn.    . diphenhydrAMINE (BENADRYL) 25 mg capsule Take 25 mg by mouth every 6 (six) hours as needed for itching.    . diphenoxylate-atropine (LOMOTIL) 2.5-0.025 MG tablet Take 1 tablet by mouth 4 (four) times daily as needed for diarrhea or loose stools. 30 tablet 0  . DULoxetine (CYMBALTA) 60 MG capsule Take 1 capsule (60 mg total) by mouth 2 (  two) times daily. 60 capsule 0  . enoxaparin (LOVENOX) 150 MG/ML injection INJECT 0.93 MLS (140 MG TOTAL) INTO THE SKIN DAILY. 30 Syringe 1  . feeding supplement, ENSURE ENLIVE, (ENSURE ENLIVE) LIQD Take 237 mLs by mouth 2 (two) times daily between meals. 237 mL 12  . gabapentin (NEURONTIN) 300 MG capsule Take 1 capsule (300 mg total) by mouth 3 (three) times daily. 90 capsule 3  . HYDROmorphone  (DILAUDID) 8 MG tablet Take 1 tablet (8 mg total) by mouth every 4 (four) hours as needed for moderate pain or severe pain. 50 tablet 0  . ibuprofen (ADVIL,MOTRIN) 200 MG tablet Take 400 mg by mouth every 6 (six) hours as needed for fever, headache, moderate pain or cramping.    . insulin glargine (LANTUS) 100 UNIT/ML injection Inject 26 Units into the skin at bedtime.    [name] lactulose (CHRONULAC) 10 GM/15ML solution Take 30 mLs (20 g total) by mouth 2 (two) times daily. 240 mL 0  . metoprolol tartrate (LOPRESSOR) 25 MG tablet Take 1 tablet (25 mg total) by mouth 2 (two) times daily. 60 tablet 0  . senna-docusate (SENOKOT-S) 8.6-50 MG tablet Take 1 tablet by mouth 2 (two) times daily. 30 tablet 0  . tiZANidine (ZANAFLEX) 4 MG tablet Take 4 mg by mouth every 8 (eight) hours as needed for muscle spasms.   0  . VIAGRA 100 MG tablet Take 50 mg by mouth as needed for erectile dysfunction.     [name] ALPRAZolam (XANAX) 1 MG tablet Take 1 tablet (1 mg total) by mouth 3 (three) times daily as needed for anxiety. 90 tablet 0   No current facility-administered medications for this visit.     Allergies:  Allergies  Allergen Reactions  . Ceftriaxone Hives  . Hydrocodone Swelling    Past Medical History, Surgical history, Social history, and Family History were reviewed and updated.   Physical Exam: Blood pressure 139/89, pulse 76, temperature 97.7 F (36.5 C), temperature source Oral, resp. rate 20, height '6\' 3"'$  (1.905 m), weight 189 lb 12.8 oz (86.093 kg), SpO2 100 %. ECOG: 1 General appearance: Pleasant-appearing gentleman without distress. Head: Normocephalic, without obvious abnormality. No oral thrush noted. Neck: no adenopathy, no masses.  Lymph nodes: Cervical, supraclavicular, and axillary nodes normal. Heart:regular rate and rhythm, S1, S2.  Lung: Clear throughout auscultation. No wheezes noted. Abdomen: soft, non-tender, without masses or organomegaly no rebound or guarding. EXT:  Tender to  touch on the lateral side of his left lower extremity. No masses or lesions. Neuro: No focal deficits noted.   Lab Results: Lab Results  Component Value Date   WBC 5.8 06/17/2016   HGB 14.1 06/17/2016   HCT 43.1 06/17/2016   MCV 102.2* 06/17/2016   PLT 165 06/17/2016   EXAM: CT CHEST, ABDOMEN, AND PELVIS WITH CONTRAST  TECHNIQUE: Multidetector CT imaging of the chest, abdomen and pelvis was performed following the standard protocol during bolus administration of intravenous contrast.  CONTRAST: 138m ISOVUE-300 IOPAMIDOL (ISOVUE-300) INJECTION 61%  COMPARISON: Multiple exams, including 01/29/2016  FINDINGS: CT CHEST FINDINGS  Cardiovascular: Ascending thoracic aortic aneurysm at 4.1 cm.  Mediastinum/Nodes: No thoracic adenopathy identified.  Lungs/Pleura: Severe emphysema.  Right apical pleural parenchymal scarring with nodular component unchanged. Irregular volume loss and nodularity in the right suprahilar region, no change, with bandlike density extending to the posterior pleural surface on image 53 of series 4 not changed in appearance from 01/29/2016.  Minimal subpleural nodularity in the right upper lobe laterally on image  74/4, unchanged.  Right middle lobe infrahilar density measuring up to 2.3 cm is an oblique transverse diameter, previously the same with associated volume loss in the right middle lobe. Wedge resections in the right lower lobe.  On the left side, there is a reduction in the airspace opacity anteriorly in the left lower lobe for example is the S measuring 1.7 by 1.2 cm on image 86 series 4, previously approximately 2.4 by 1.6 cm. Peripheral triangular densities peripherally in the left lower lobe are also slightly reduced in size from prior, with an upper component measuring 1.4 by 1.0 cm is and a lower component measuring approximately 2.9 by 1.4 cm (previously 3.8 by 1.8 cm).  Bilateral airway  thickening.  Musculoskeletal: Stable sclerotic deformity of the right fourth rib posteriorly. Thoracic spondylosis. Stable cortical thickening in the right seventh rib medially.  CT ABDOMEN PELVIS FINDINGS  Hepatobiliary: Diffuse hepatic steatosis. Gallbladder unremarkable.  Pancreas: Once again we demonstrate abnormal small enhancing pancreatic nodules in addition to a more prominent nodule of the junction of the pancreatic body and head anteriorly. This more prominent mass measures 2.9 by 2.4 cm on image 67/2, by my measurement was the same on 01/29/2016. Smaller solid nodules are present along the margins of the pancreatic body including a 1.4 by 0.9 cm nodule on image 63/2 which by my measurements was previously the same. Dorsal pancreatic duct dilatation.  Spleen: Unremarkable  Adrenals/Urinary Tract: Adrenal glands normal. Left nephrectomy. No right renal tumor. No stones identified. Urinary bladder unremarkable.  Stomach/Bowel: Unremarkable. Appendix normal.  Vascular/Lymphatic: Mild iliac artery atherosclerotic calcification. No adenopathy identified.  Reproductive: Unremarkable  Other: Infiltrative nodular foci of stranding in the right anterior abdomen, possibly from injections. Pain pump along the left anterior subcutaneous tissues, catheter extending around into the spinal canal and terminating in the thoracic region.  Musculoskeletal: Lytic lesion adjacent to the right S1 foramen in the sacrum, 3.3 by 2.5 cm, previously the same by my measurements. Surrounding sclerosis in the right iliac bone and along both sacroiliac joints. Lytic lesion in the right inferior pubic ramus, stable. Lytic lesion anteriorly in the intertrochanteric region of the right hip, may predispose to right hip fracture. Overall these appear stable.  IMPRESSION: 1. By my measurements the regions of nodular volume loss in the right and left lung, the pancreatic mass is, and  the osseous metastatic lesions involving the right fourth rib, right sacrum, intertrochanteric region of the right hip, and right inferior pubic ramus are stable in size and appearance, aside from some mild reduction in size of the lesions of the left lung base. 2. Small ascending aortic aneurysm. Recommend annual imaging followup by CTA or MRA. This recommendation follows 2010 ACCF/AHA/AATS/ACR/ASA/SCA/SCAI/SIR/STS/SVM Guidelines for the Diagnosis and Management of Patients with Thoracic Aortic Disease. Circulation. 2010; 121: H419-F790 3. Other imaging findings of potential clinical significance: Severe emphysema. Airway thickening is present, suggesting bronchitis or reactive airways disease. Hepatic steatosis.  Impression and Plan:  This is a pleasant 50 year old gentleman with the following issues. 1. Metastatic renal cell carcinoma.  He has documented disease to the lung and the pancreas. He is status post multiple therapies. He is currently on Cabometyx and have tolerated it well. CT scan on 06/17/2016 was reviewed today and continues to show excellent response to therapy. His quality of life and performance status remained reasonably maintained at this time. He is experiencing some slight crease in his diarrhea and some minor weight loss. I suggested the dose reduction if these symptoms  persist in the future. He is comfortable with continuing the current dose. 2. Depression/Anxiety: He remains on Xanax and Cymbalta. His mood is much improved. These will be refilled today. 3. Weight loss: We have discussed ready use to boost his appetite including nutritional supplements and snacks. 4. Left lower extremity pain and a mass: Chronic in nature and not changed. Seems to manageable at this time. 5. Hypertension: Controlled at this time. No medication is needed at this time. 6. Pulmonary embolism: He is currently on Lovenox without any recent complications of bleeding or thrombosis. CT scan  did not show any recurrent thrombosis. 7. Prognosis: He does have an incurable malignancy however he responded well to treatment that the plan is to continue with this palliative approach. 8. Diarrhea: Improved with Imodium and Lomotil at this time. I continue to recommend aggressive management with these medications. If diarrhea continues to be an issue, dose reductions of Cabometyx will be indicated. 9. Follow up: in 4 weeks.   [facility], MD 06/18/2016

## 2016-06-18 NOTE — Telephone Encounter (Signed)
spoke w/ pt confirmed apts for 8/2

## 2016-06-20 ENCOUNTER — Other Ambulatory Visit: Payer: Self-pay | Admitting: Oncology

## 2016-06-27 DIAGNOSIS — M5417 Radiculopathy, lumbosacral region: Secondary | ICD-10-CM | POA: Diagnosis not present

## 2016-06-27 DIAGNOSIS — Z9689 Presence of other specified functional implants: Secondary | ICD-10-CM | POA: Diagnosis not present

## 2016-06-27 DIAGNOSIS — M25551 Pain in right hip: Secondary | ICD-10-CM | POA: Diagnosis not present

## 2016-06-29 ENCOUNTER — Other Ambulatory Visit: Payer: Self-pay | Admitting: Oncology

## 2016-07-01 ENCOUNTER — Other Ambulatory Visit: Payer: Self-pay | Admitting: *Deleted

## 2016-07-01 DIAGNOSIS — C649 Malignant neoplasm of unspecified kidney, except renal pelvis: Secondary | ICD-10-CM

## 2016-07-01 MED ORDER — CABOZANTINIB S-MALATE 60 MG PO TABS: 60.0000 mg | ORAL_TABLET | Freq: Every day | ORAL | Status: DC

## 2016-07-13 DIAGNOSIS — C7800 Secondary malignant neoplasm of unspecified lung: Secondary | ICD-10-CM | POA: Diagnosis not present

## 2016-07-15 DIAGNOSIS — C7800 Secondary malignant neoplasm of unspecified lung: Secondary | ICD-10-CM | POA: Diagnosis not present

## 2016-07-16 ENCOUNTER — Other Ambulatory Visit: Payer: Self-pay | Admitting: Oncology

## 2016-07-23 ENCOUNTER — Ambulatory Visit: Payer: Medicare Other | Admitting: Oncology

## 2016-07-23 ENCOUNTER — Other Ambulatory Visit: Payer: Self-pay | Admitting: *Deleted

## 2016-07-23 ENCOUNTER — Other Ambulatory Visit: Payer: Medicare Other

## 2016-07-23 ENCOUNTER — Telehealth: Payer: Self-pay | Admitting: *Deleted

## 2016-07-23 ENCOUNTER — Other Ambulatory Visit: Payer: Self-pay | Admitting: Oncology

## 2016-07-23 ENCOUNTER — Telehealth: Payer: Self-pay | Admitting: Oncology

## 2016-07-23 DIAGNOSIS — F419 Anxiety disorder, unspecified: Secondary | ICD-10-CM

## 2016-07-23 DIAGNOSIS — K611 Rectal abscess: Secondary | ICD-10-CM

## 2016-07-23 DIAGNOSIS — C7951 Secondary malignant neoplasm of bone: Secondary | ICD-10-CM

## 2016-07-23 DIAGNOSIS — K8689 Other specified diseases of pancreas: Secondary | ICD-10-CM

## 2016-07-23 DIAGNOSIS — C649 Malignant neoplasm of unspecified kidney, except renal pelvis: Secondary | ICD-10-CM

## 2016-07-23 IMAGING — CT CT CHEST W/ CM
2 of 11 series · 10 of 46 positions shown, 16 images · IV contrast (omnipaque)
Comparison: Prior examinations 05/16/2014 and 01/20/2014.

CLINICAL DATA: Left renal cell carcinoma diagnosed in 1888 status
post left nephrectomy. Oral chemotherapy in progress. Previous
partial right lung resection. Right hip pain.

EXAM:
CT CHEST WITH CONTRAST
CT ABDOMEN AND PELVIS WITH AND WITHOUT CONTRAST
TECHNIQUE: Multidetector CT imaging of the chest was performed during
intravenous contrast administration. Multidetector CT imaging of the
abdomen and pelvis was performed following the standard protocol
before and during bolus administration of intravenous contrast.
CONTRAST:  100mL OMNIPAQUE IOHEXOL 300 MG/ML  SOLN

[Series 6: venous · axial · portal-venous · 0.79mm/px · z∈[-658,-138]mm · 8 of 134 slices shown, 13 images]
[im 15/134  soft-tissue]
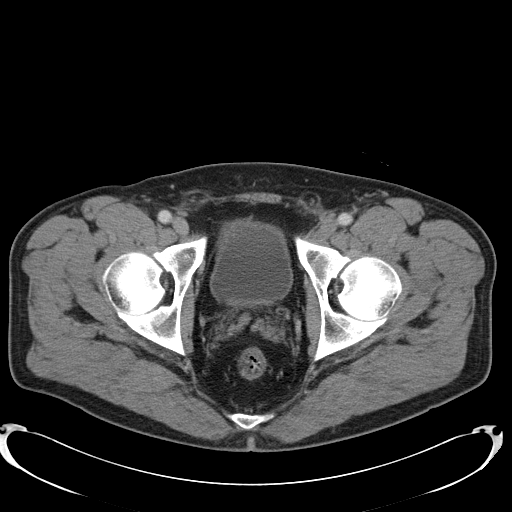
[im 15/134  bone]
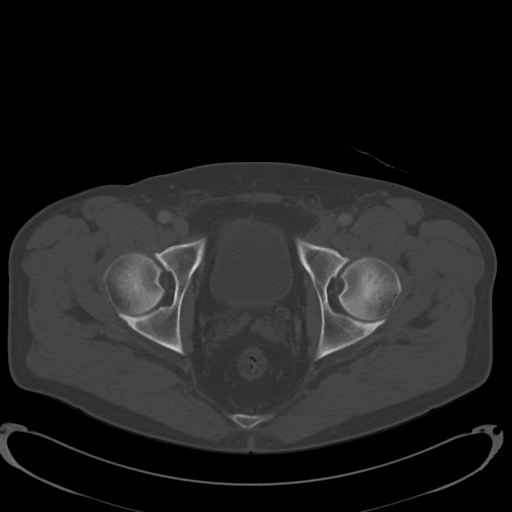
[im 30/134  soft-tissue]
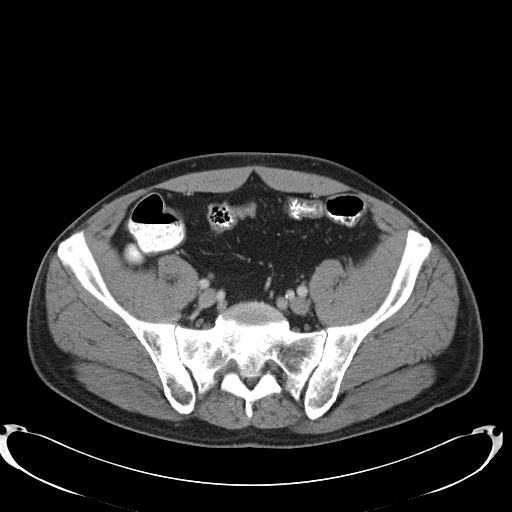
[im 45/134  soft-tissue]
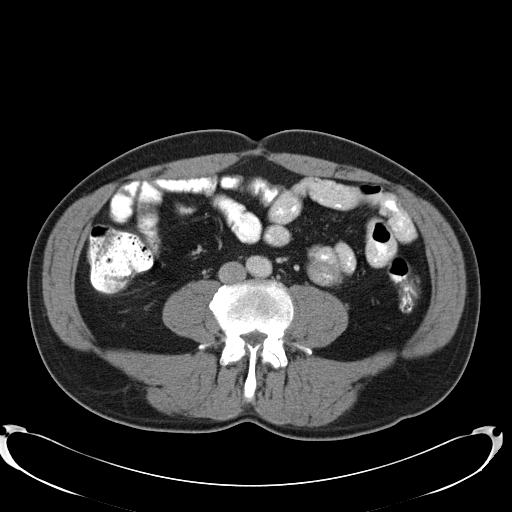
[im 60/134  soft-tissue]
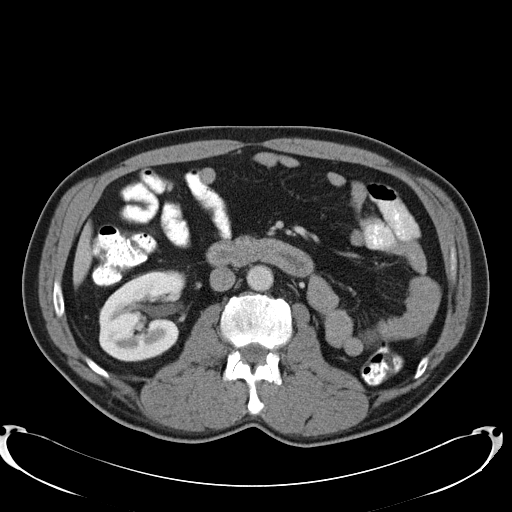
[im 74/134  soft-tissue]
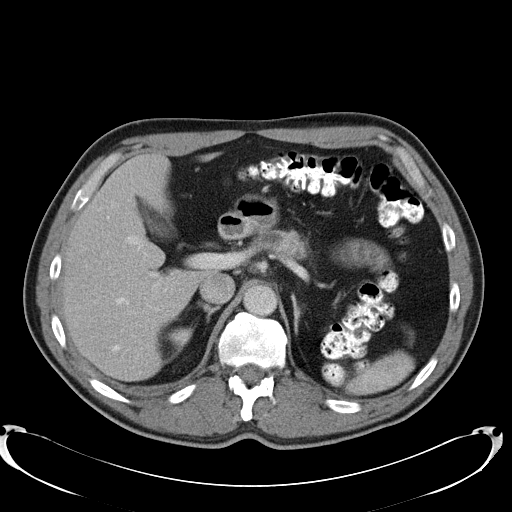
[im 74/134  lung]
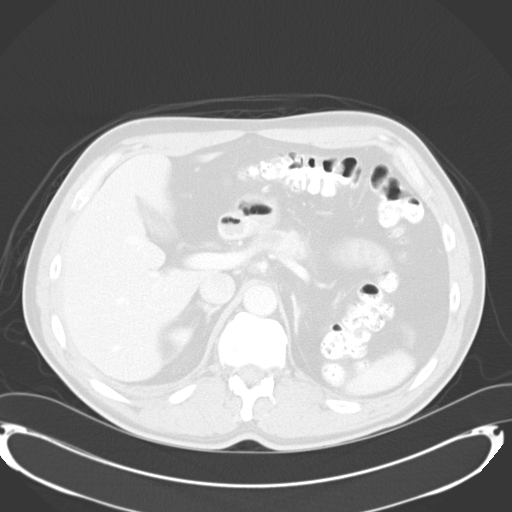
[im 89/134  soft-tissue]
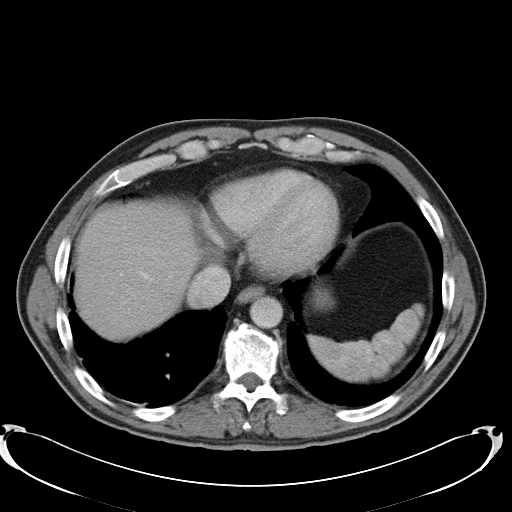
[im 89/134  lung]
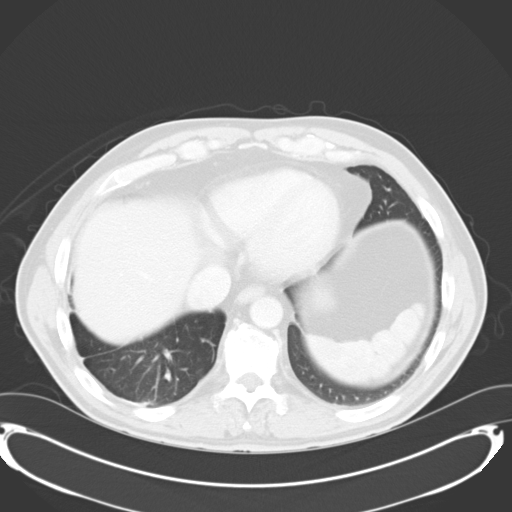
[im 104/134  soft-tissue]
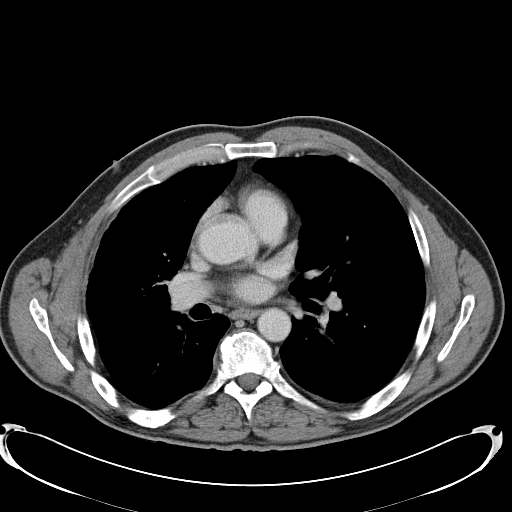
[im 104/134  lung]
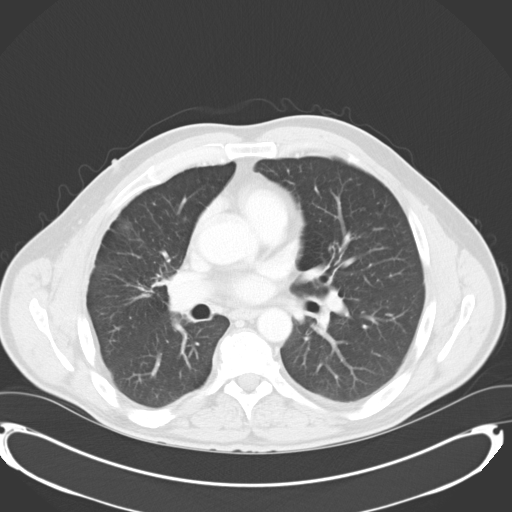
[im 119/134  soft-tissue]
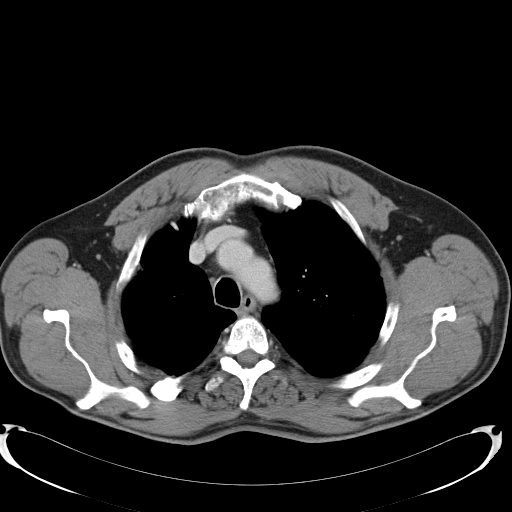
[im 119/134  lung]
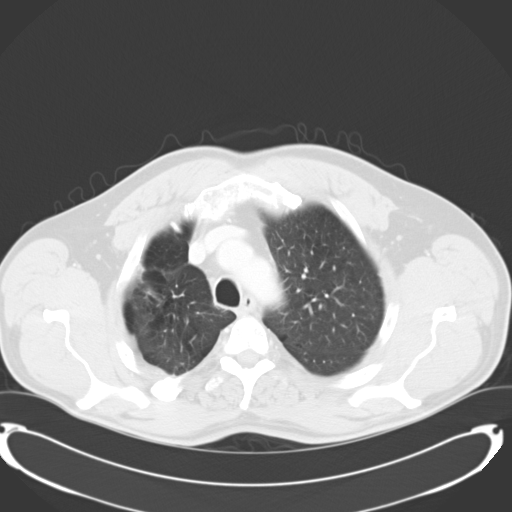

[Series 602: <mpr thick range> · coronal · 0.79mm/px · 2 of 87 slices shown, 3 images]
[im 29/87  soft-tissue]
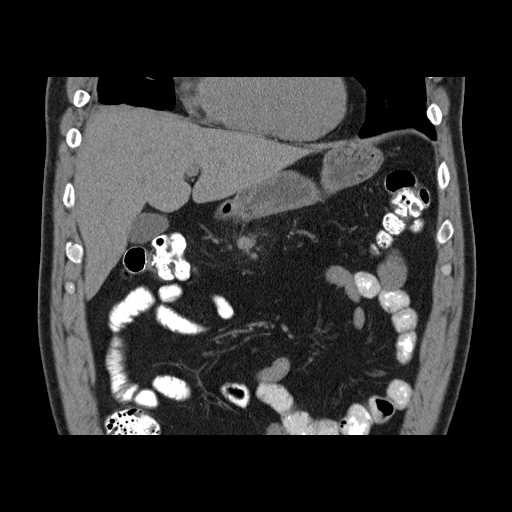
[im 29/87  bone]
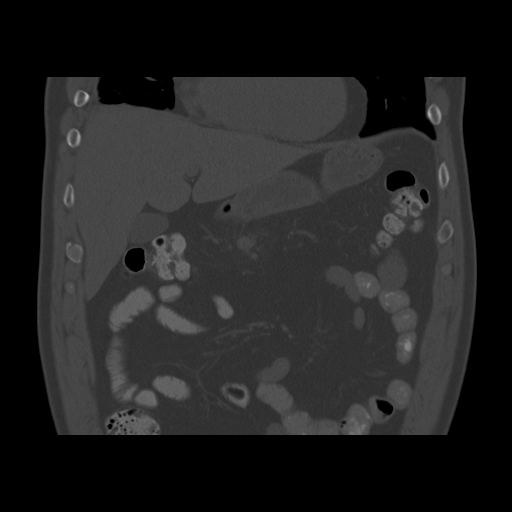
[im 58/87  soft-tissue]
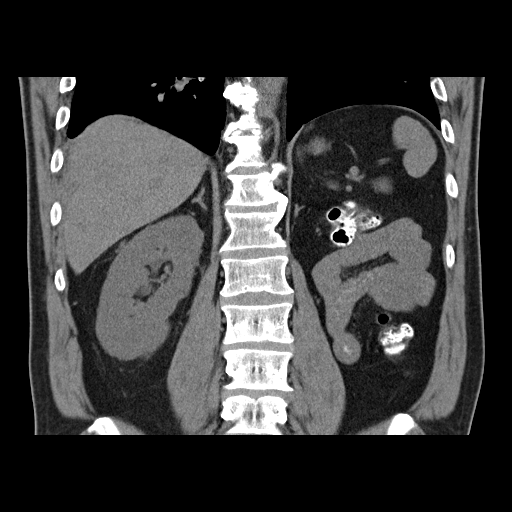

[10 of 46 positions shown; findings below may reference images not displayed]

FINDINGS: CT CHEST FINDINGS

Mediastinum: There are no enlarged mediastinal, hilar or axillary
lymph nodes. The thyroid gland, trachea and esophagus appear normal.
The heart size is normal. There are no significant vascular
findings.

Lungs/Pleura: There is no pleural or pericardial effusion.Pleural
thickening on the right is stable. There are stable postsurgical and
presumed postradiation changes in the right lung. Right suprahilar
perihilar scarring on images 18 through 21 is stable. The more
mass-like density involving the right middle lobe and adjacent right
upper lobe is stable, measuring approximately 2.0 x 1.8 cm on image
34. 4 mm subpleural right upper lobe nodule on image number 27 is
stable. There are no new or enlarging pulmonary nodules. The left
lung is clear.

Musculoskeletal/Chest wall: No chest wall mass or suspicious osseous
findings. Thoracotomy changes on the right are stable.

CT ABDOMEN AND PELVIS FINDINGS

Liver/Biliary/Pancreas: The liver is normal in density without focal
abnormality. No evidence of gallstones, gallbladder wall thickening
or biliary dilatation. Again demonstrated are multiple hypervascular
pancreatic mass is associated with dilatation of the pancreatic duct
within the pancreatic body and tail. Compared with the most recent
study, the lesions demonstrated less avid enhancement and are more
difficult to accurately measure. Subjectively, there has been little
change. Lesion at the junction of the pancreatic head and body
measures 2.1 x 1.5 cm on image 65 of series 6. A lesion within the
pancreatic body measures 2.0 x 2.7 cm on image 60.

Spleen/Adrenal glands: Unremarkable.

Kidneys/Ureters/Bladder: The left kidney is surgically absent. There
is no mass within the left nephrectomy bed. The right kidney appears
normal. There is no hydronephrosis or urinary tract calculus. The
bladder appears normal.

Bowel/Peritoneum: The stomach, small bowel, appendix and colon
demonstrate no significant findings. No ascites or peritoneal
nodularity.

Retroperitoneum/Pelvis: There are no enlarged abdominal or pelvic
lymph nodes. No significant vascular findings are present. The
prostate gland appears normal.

Abdominal wall: No abdominal wall masses or hernias.

Musculoskeletal: Lytic lesion in the right aspect of the sacrum is
similar to prior studies, measuring 3.1 x 2.2 cm on image 107. No
new lesions or pathologic fractures demonstrated.
IMPRESSION: 1. The findings in the chest appear stable. The nodular densities
medially in the right lung are favored to reflect post treatment
scarring based on stability. No new or enlarging nodules
demonstrated.
2. The multiple enhancing lesions within the pancreas demonstrate
decreased enhancement, likely related to interval therapy. Overall
size and resulting pancreatic ductal dilatation have not
significantly changed.
3. Lytic lesion in the right sacrum is also stable.
4. No progressive disease demonstrated.

## 2016-07-23 NOTE — Telephone Encounter (Signed)
Pt called and left message stating that pt will not be coming for appt today with Dr. [name] due to no transportation available.  Pt wished to reschedule appt. Pt's   Phone   [phone].

## 2016-07-23 NOTE — Telephone Encounter (Signed)
I can see him on 8/10 at 3:00. He needs an MD + Labs on that day

## 2016-07-23 NOTE — Telephone Encounter (Signed)
"  Have an appointment this morning that I need to reschedule."  This nurse shared the appointments have already been taken care of.  Provided new date and time.  No further questions.

## 2016-07-23 NOTE — Telephone Encounter (Signed)
called pt to confirm apt times, pt already aware

## 2016-07-23 NOTE — Telephone Encounter (Signed)
Per [name], called in script for xanax to [facility] See [facility]

## 2016-07-28 ENCOUNTER — Other Ambulatory Visit: Payer: Self-pay | Admitting: *Deleted

## 2016-07-28 DIAGNOSIS — C649 Malignant neoplasm of unspecified kidney, except renal pelvis: Secondary | ICD-10-CM

## 2016-07-28 MED ORDER — CABOZANTINIB S-MALATE 60 MG PO TABS: 60.0000 mg | ORAL_TABLET | Freq: Every day | ORAL | 0 refills | Status: DC

## 2016-07-31 ENCOUNTER — Ambulatory Visit (HOSPITAL_BASED_OUTPATIENT_CLINIC_OR_DEPARTMENT_OTHER): Payer: Medicare Other | Admitting: Oncology

## 2016-07-31 ENCOUNTER — Other Ambulatory Visit (HOSPITAL_BASED_OUTPATIENT_CLINIC_OR_DEPARTMENT_OTHER): Payer: Medicare Other

## 2016-07-31 VITALS — BP 121/81 | HR 90 | Temp 98.3°F | Resp 18 | Ht 75.0 in | Wt 185.8 lb

## 2016-07-31 DIAGNOSIS — N390 Urinary tract infection, site not specified: Secondary | ICD-10-CM

## 2016-07-31 DIAGNOSIS — I2699 Other pulmonary embolism without acute cor pulmonale: Secondary | ICD-10-CM

## 2016-07-31 DIAGNOSIS — C649 Malignant neoplasm of unspecified kidney, except renal pelvis: Secondary | ICD-10-CM | POA: Diagnosis not present

## 2016-07-31 DIAGNOSIS — I10 Essential (primary) hypertension: Secondary | ICD-10-CM

## 2016-07-31 DIAGNOSIS — R2242 Localized swelling, mass and lump, left lower limb: Secondary | ICD-10-CM | POA: Diagnosis not present

## 2016-07-31 DIAGNOSIS — R197 Diarrhea, unspecified: Secondary | ICD-10-CM

## 2016-07-31 DIAGNOSIS — R630 Anorexia: Secondary | ICD-10-CM

## 2016-07-31 DIAGNOSIS — R634 Abnormal weight loss: Secondary | ICD-10-CM

## 2016-07-31 DIAGNOSIS — M79605 Pain in left leg: Secondary | ICD-10-CM

## 2016-07-31 DIAGNOSIS — F418 Other specified anxiety disorders: Secondary | ICD-10-CM

## 2016-07-31 LAB — COMPREHENSIVE METABOLIC PANEL
ALT: 34 U/L (ref 0–55)
AST: 23 U/L (ref 5–34)
Albumin: 2.8 g/dL — ABNORMAL LOW (ref 3.5–5.0)
Alkaline Phosphatase: 101 U/L (ref 40–150)
Anion Gap: 5 mEq/L (ref 3–11)
BUN: 7 mg/dL (ref 7.0–26.0)
CO2: 32 mEq/L — ABNORMAL HIGH (ref 22–29)
Calcium: 9 mg/dL (ref 8.4–10.4)
Chloride: 104 mEq/L (ref 98–109)
Creatinine: 1 mg/dL (ref 0.7–1.3)
EGFR: 88 mL/min/{1.73_m2} — ABNORMAL LOW (ref >90)
Glucose: 87 mg/dl (ref 70–140)
Potassium: 3.6 mEq/L (ref 3.5–5.1)
Sodium: 142 mEq/L (ref 136–145)
Total Bilirubin: <0.3 mg/dL (ref 0.20–1.20)
Total Protein: 6.6 g/dL (ref 6.4–8.3)

## 2016-07-31 LAB — CBC WITH DIFFERENTIAL/PLATELET
BASO%: 0.2 % (ref 0.0–2.0)
Basophils Absolute: 0 10*3/uL (ref 0.0–0.1)
EOS%: 8.8 % — ABNORMAL HIGH (ref 0.0–7.0)
Eosinophils Absolute: 0.4 10*3/uL (ref 0.0–0.5)
HCT: 39 % (ref 38.4–49.9)
HGB: 12.7 g/dL — ABNORMAL LOW (ref 13.0–17.1)
LYMPH%: 32.1 % (ref 14.0–49.0)
MCH: 33.4 pg (ref 27.2–33.4)
MCHC: 32.6 g/dL (ref 32.0–36.0)
MCV: 102.6 fL — ABNORMAL HIGH (ref 79.3–98.0)
MONO#: 0.2 10*3/uL (ref 0.1–0.9)
MONO%: 4.8 % (ref 0.0–14.0)
NEUT#: 2.5 10*3/uL (ref 1.5–6.5)
NEUT%: 54.1 % (ref 39.0–75.0)
Platelets: 111 10*3/uL — ABNORMAL LOW (ref 140–400)
RBC: 3.8 10*6/uL — ABNORMAL LOW (ref 4.20–5.82)
RDW: 15.3 % — ABNORMAL HIGH (ref 11.0–14.6)
WBC: 4.6 10*3/uL (ref 4.0–10.3)
lymph#: 1.5 10*3/uL (ref 0.9–3.3)
nRBC: 0 % (ref 0–0)

## 2016-07-31 MED ORDER — DEXTROMETHORPHAN HBR 15 MG/5ML PO SYRP: 10.0000 mL | ORAL_SOLUTION | Freq: Four times a day (QID) | ORAL | 0 refills | Status: DC | PRN

## 2016-07-31 NOTE — Progress Notes (Signed)
Hematology and Oncology Follow Up Visit  Keith [name] [id number] January 19, 1966 50 y.o. 07/31/2016 2:37 PM  Keith Bring, MD  Keith [name], M.D.  Keith Bent, MD  Keith Banister, MD    Principle Diagnosis: This is a 50 year old gentleman with stage IV renal cell carcinoma diagnosed in 2009.  Prior Therapy: 1. Status post laparoscopic radical nephrectomy.  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  2. Patient status post thoracotomy for a synchronous metastatic lung lesions done October 2009.  He had a lower lobe nodule, biopsy proven to be metastatic renal cell carcinoma.   3. Patient is status post stereotactic radiotherapy to pulmonary nodules in May of 2010. 4. He is S/P Sutent 50 mg 4 weeks on 2 weeks off from 10/2010 to 03/2013. He progressed at that time.  5. He is S/P radiation to the right sacral bone between 4/22 to 4/30.  6. He is S/P XRT to the left shoulder 03/20/14 to 03/31/14. 7. Votrient 800 mg by mouth daily from 03/2013 through 06/22/2015. Discontinued secondary to disease progression. 8. Nivolumab 3 mg/kg given every 2 weeks started on 06/29/2015. He is status post 4 cycles completed 08/10/2015. He developed disease progression in September 2016.  9. Status post radiation therapy to the left mid fibula completed on 11/14/2015. He received a grade 1 fraction.    Current therapy:  Cabometyx 60 mg daily started in November 2016.   Interim History:  Keith [name] presents today for a followup visit. Since his last visit, he reports upper respiratory tract infection and sinus congestion. He has reported nonproductive cough but no fevers or chills. He denied any wheezing or dyspnea on exertion.    He continues to take Cabometyx without any other complications. His diarrhea is manageable with Imodium and Lomotil.  He denied any hand-foot syndrome, fatigue or other complications. His quality of life remains stable. Appetite have been fluctuating lost  5 pounds since the last visit. He feels that this is not related to medication rather than stress in his life. He does not have any problem with dysphagia or difficulty swallowing.   He continues to use Lovenox without any issues. He denied any bleeding or thrombosis episode. He does not report any difficulties with self injection at this time.  He did not report any headaches blurred vision or double vision. Does not report any seizure activity or alteration of mental status. Does not report any abdominal pain or hematochezia. Does not report any hematuria but does report occasional hesitancy and nocturia. He does not report any rashes or lesions or petechiae. He does not report any lymphadenopathy.  Remainder of his review of system is unremarkable.  Medications: I have reviewed the patient's current medications.  Current Outpatient Prescriptions  Medication Sig Dispense Refill  . ALPRAZolam (XANAX) 1 MG tablet TAKE 1 TABLET THREE TIMES A DAY AS NEEDED FOR PAIN 90 tablet 0  . cabozantinib S-Malate (CABOMETYX) 60 MG TABS Take 60 mg by mouth daily. Take one tablet by mouth daily with at least 8 ounces of water on an empty stomach. Do not eat for 2 hours before or 1 hour after. Swallow whole. 30 tablet 0  . calcium carbonate (TUMS - DOSED IN MG ELEMENTAL CALCIUM) 500 MG chewable tablet Chew 1 tablet by mouth as needed for indigestion or heartburn.    . dextromethorphan (ROBITUSSIN MAXIMUM STRENGTH) 15 MG/5ML syrup Take 10 mLs (30 mg total) by mouth 4 (four) times daily as needed for  cough. 120 mL 0  . diphenhydrAMINE (BENADRYL) 25 mg capsule Take 25 mg by mouth every 6 (six) hours as needed for itching.    . diphenoxylate-atropine (LOMOTIL) 2.5-0.025 MG tablet Take 1 tablet by mouth 4 (four) times daily as needed for diarrhea or loose stools. 30 tablet 0  . DULoxetine (CYMBALTA) 60 MG capsule TAKE ONE CAPSULE BY MOUTH TWICE A DAY 60 capsule 0  . enoxaparin (LOVENOX) 150 MG/ML injection INJECT 0.93 MLS  (140 MG TOTAL) INTO THE SKIN DAILY. 30 Syringe 1  . feeding supplement, ENSURE ENLIVE, (ENSURE ENLIVE) LIQD Take 237 mLs by mouth 2 (two) times daily between meals. 237 mL 12  . gabapentin (NEURONTIN) 300 MG capsule TAKE 1 CAPSULE THREE TIMES A DAY 90 capsule 0  . HYDROmorphone (DILAUDID) 8 MG tablet Take 1 tablet (8 mg total) by mouth every 4 (four) hours as needed for moderate pain or severe pain. 50 tablet 0  . ibuprofen (ADVIL,MOTRIN) 200 MG tablet Take 400 mg by mouth every 6 (six) hours as needed for fever, headache, moderate pain or cramping.    . insulin glargine (LANTUS) 100 UNIT/ML injection Inject 26 Units into the skin at bedtime.    [name] lactulose (CHRONULAC) 10 GM/15ML solution Take 30 mLs (20 g total) by mouth 2 (two) times daily. 240 mL 0  . metoprolol tartrate (LOPRESSOR) 25 MG tablet Take 1 tablet (25 mg total) by mouth 2 (two) times daily. 60 tablet 0  . senna-docusate (SENOKOT-S) 8.6-50 MG tablet Take 1 tablet by mouth 2 (two) times daily. 30 tablet 0  . tiZANidine (ZANAFLEX) 4 MG tablet Take 4 mg by mouth every 8 (eight) hours as needed for muscle spasms.   0  . VIAGRA 100 MG tablet Take 50 mg by mouth as needed for erectile dysfunction.      No current facility-administered medications for this visit.      Allergies:  Allergies  Allergen Reactions  . Ceftriaxone Hives  . Hydrocodone Swelling    Past Medical History, Surgical history, Social history, and Family History were reviewed and updated.   Physical Exam: Blood pressure 121/81, pulse 90, temperature 98.3 F (36.8 C), temperature source Oral, resp. rate 18, height '6\' 3"'$  (1.905 m), weight 185 lb 12.8 oz (84.3 kg), SpO2 100 %. ECOG: 1 General appearance: Alert, awake gentleman chronically ill-appearing without distress. Head: Normocephalic, without obvious abnormality. No scleral icterus Oropharynx examination showed no ulcers or lesions. Mild erythema noted. Neck: no adenopathy, no masses.  Lymph nodes:  Cervical, supraclavicular, and axillary nodes normal. Heart:regular rate and rhythm, S1, S2.  Lung: Clear throughout auscultation. No wheezes noted. Abdomen: soft, non-tender, without masses or organomegaly no shifting dullness or ascites. EXT:  Tender to touch on the lateral side of his left lower extremity. No masses or lesions. Neuro: No focal deficits noted.   Lab Results: Lab Results  Component Value Date   WBC 4.6 07/31/2016   HGB 12.7 (L) 07/31/2016   HCT 39.0 07/31/2016   MCV 102.6 (H) 07/31/2016   PLT 111 (L) 07/31/2016   Impression and Plan:  This is a pleasant 50 year old gentleman with the following issues. 1. Metastatic renal cell carcinoma.  He has documented disease to the lung and the pancreas. He is status post multiple therapies. He is currently on Cabometyx and have tolerated it well. CT scan on 06/17/2016 showed stable disease without any evidence of progression. His quality of life and performance status remained reasonably maintained at this time. The plan is  to continue with the same dose and schedule and consider dose reduction if he develops any cumulative complications or toxicities. 2. Depression/Anxiety: He remains on Xanax and Cymbalta. His mood appeared to be stable. 3. Weight loss: This remains an issue and it is unrelated to his medication. We have discussed different strategies to help boost his appetite is agreeable to try some of these strategies. 4. Left lower extremity pain and a mass: Chronic in nature and not changed. Seems to manageable at this time. 5. Hypertension: Controlled without antihypertensive medication. 6. Pulmonary embolism: He is currently on Lovenox without any recent complications of bleeding or thrombosis. CT scan did not show any recurrent thrombosis. 7. Prognosis: He does have an incurable malignancy however he responded well to treatment that the plan is to continue with this palliative approach. 8. Diarrhea: Improved with Imodium  and Lomotil without any dose reduction. 9. Upper respiratory tract infection: No evidence of pneumonia or bronchitis. Supportive management with cough suppressants has been the recommendation at this time. 10. Follow up: in 4 weeks.   [facility], MD 07/31/16

## 2016-08-06 ENCOUNTER — Other Ambulatory Visit: Payer: Self-pay | Admitting: Oncology

## 2016-08-08 ENCOUNTER — Telehealth: Payer: Self-pay | Admitting: Oncology

## 2016-08-08 NOTE — Telephone Encounter (Signed)
lvm to inform pt of 9/12 appt at 1245 pm

## 2016-08-13 DIAGNOSIS — C7800 Secondary malignant neoplasm of unspecified lung: Secondary | ICD-10-CM | POA: Diagnosis not present

## 2016-08-15 DIAGNOSIS — C7800 Secondary malignant neoplasm of unspecified lung: Secondary | ICD-10-CM | POA: Diagnosis not present

## 2016-08-18 ENCOUNTER — Other Ambulatory Visit: Payer: Self-pay | Admitting: Oncology

## 2016-08-20 ENCOUNTER — Other Ambulatory Visit: Payer: Self-pay | Admitting: Oncology

## 2016-08-20 DIAGNOSIS — K611 Rectal abscess: Secondary | ICD-10-CM

## 2016-08-20 DIAGNOSIS — C7951 Secondary malignant neoplasm of bone: Secondary | ICD-10-CM

## 2016-08-20 DIAGNOSIS — F419 Anxiety disorder, unspecified: Secondary | ICD-10-CM

## 2016-08-20 DIAGNOSIS — K8689 Other specified diseases of pancreas: Secondary | ICD-10-CM

## 2016-08-20 DIAGNOSIS — C649 Malignant neoplasm of unspecified kidney, except renal pelvis: Secondary | ICD-10-CM

## 2016-09-02 ENCOUNTER — Other Ambulatory Visit: Payer: Self-pay | Admitting: *Deleted

## 2016-09-02 ENCOUNTER — Ambulatory Visit (HOSPITAL_BASED_OUTPATIENT_CLINIC_OR_DEPARTMENT_OTHER): Payer: Medicare Other | Admitting: Oncology

## 2016-09-02 ENCOUNTER — Other Ambulatory Visit (HOSPITAL_BASED_OUTPATIENT_CLINIC_OR_DEPARTMENT_OTHER): Payer: Medicare Other

## 2016-09-02 ENCOUNTER — Telehealth: Payer: Self-pay | Admitting: Oncology

## 2016-09-02 VITALS — BP 114/78 | HR 66 | Temp 98.1°F | Resp 17 | Wt 187.4 lb

## 2016-09-02 DIAGNOSIS — C7951 Secondary malignant neoplasm of bone: Secondary | ICD-10-CM

## 2016-09-02 DIAGNOSIS — R630 Anorexia: Secondary | ICD-10-CM

## 2016-09-02 DIAGNOSIS — K8689 Other specified diseases of pancreas: Secondary | ICD-10-CM

## 2016-09-02 DIAGNOSIS — C649 Malignant neoplasm of unspecified kidney, except renal pelvis: Secondary | ICD-10-CM

## 2016-09-02 DIAGNOSIS — C7800 Secondary malignant neoplasm of unspecified lung: Secondary | ICD-10-CM

## 2016-09-02 DIAGNOSIS — F419 Anxiety disorder, unspecified: Secondary | ICD-10-CM

## 2016-09-02 DIAGNOSIS — M79605 Pain in left leg: Secondary | ICD-10-CM

## 2016-09-02 DIAGNOSIS — I2699 Other pulmonary embolism without acute cor pulmonale: Secondary | ICD-10-CM

## 2016-09-02 DIAGNOSIS — R634 Abnormal weight loss: Secondary | ICD-10-CM

## 2016-09-02 DIAGNOSIS — D649 Anemia, unspecified: Secondary | ICD-10-CM | POA: Diagnosis not present

## 2016-09-02 DIAGNOSIS — C7889 Secondary malignant neoplasm of other digestive organs: Secondary | ICD-10-CM | POA: Diagnosis not present

## 2016-09-02 DIAGNOSIS — K611 Rectal abscess: Secondary | ICD-10-CM

## 2016-09-02 DIAGNOSIS — R197 Diarrhea, unspecified: Secondary | ICD-10-CM

## 2016-09-02 DIAGNOSIS — R2242 Localized swelling, mass and lump, left lower limb: Secondary | ICD-10-CM

## 2016-09-02 DIAGNOSIS — F418 Other specified anxiety disorders: Secondary | ICD-10-CM

## 2016-09-02 LAB — COMPREHENSIVE METABOLIC PANEL
ALT: 51 U/L (ref 0–55)
AST: 34 U/L (ref 5–34)
Albumin: 3.1 g/dL — ABNORMAL LOW (ref 3.5–5.0)
Alkaline Phosphatase: 107 U/L (ref 40–150)
Anion Gap: 8 mEq/L (ref 3–11)
BUN: 8.7 mg/dL (ref 7.0–26.0)
CO2: 28 mEq/L (ref 22–29)
Calcium: 8.9 mg/dL (ref 8.4–10.4)
Chloride: 105 mEq/L (ref 98–109)
Creatinine: 1.3 mg/dL (ref 0.7–1.3)
EGFR: 67 mL/min/{1.73_m2} — ABNORMAL LOW (ref >90)
Glucose: 99 mg/dl (ref 70–140)
Potassium: 4 mEq/L (ref 3.5–5.1)
Sodium: 141 mEq/L (ref 136–145)
Total Bilirubin: <0.3 mg/dL (ref 0.20–1.20)
Total Protein: 6.8 g/dL (ref 6.4–8.3)

## 2016-09-02 LAB — CBC WITH DIFFERENTIAL/PLATELET
BASO%: 0.5 % (ref 0.0–2.0)
Basophils Absolute: 0 10*3/uL (ref 0.0–0.1)
EOS%: 6 % (ref 0.0–7.0)
Eosinophils Absolute: 0.3 10*3/uL (ref 0.0–0.5)
HCT: 39.9 % (ref 38.4–49.9)
HGB: 13 g/dL (ref 13.0–17.1)
LYMPH%: 36 % (ref 14.0–49.0)
MCH: 33.6 pg — ABNORMAL HIGH (ref 27.2–33.4)
MCHC: 32.6 g/dL (ref 32.0–36.0)
MCV: 103.1 fL — ABNORMAL HIGH (ref 79.3–98.0)
MONO#: 0.3 10*3/uL (ref 0.1–0.9)
MONO%: 4.7 % (ref 0.0–14.0)
NEUT#: 2.9 10*3/uL (ref 1.5–6.5)
NEUT%: 52.8 % (ref 39.0–75.0)
Platelets: 131 10*3/uL — ABNORMAL LOW (ref 140–400)
RBC: 3.87 10*6/uL — ABNORMAL LOW (ref 4.20–5.82)
RDW: 15.5 % — ABNORMAL HIGH (ref 11.0–14.6)
WBC: 5.5 10*3/uL (ref 4.0–10.3)
lymph#: 2 10*3/uL (ref 0.9–3.3)

## 2016-09-02 MED ORDER — ALPRAZOLAM 1 MG PO TABS: 1.0000 mg | ORAL_TABLET | Freq: Three times a day (TID) | ORAL | 0 refills | Status: DC | PRN

## 2016-09-02 MED ORDER — DIPHENOXYLATE-ATROPINE 2.5-0.025 MG PO TABS: 1.0000 | ORAL_TABLET | Freq: Four times a day (QID) | ORAL | 0 refills | Status: DC | PRN

## 2016-09-02 MED ORDER — CABOZANTINIB S-MALATE 60 MG PO TABS: 60.0000 mg | ORAL_TABLET | Freq: Every day | ORAL | 0 refills | Status: DC

## 2016-09-02 NOTE — Telephone Encounter (Signed)
Contrast given per 09/02/16 los

## 2016-09-02 NOTE — Progress Notes (Signed)
Hematology and Oncology Follow Up Visit  Berthel Bagnall [id number] 1966/09/16 50 y.o. 09/02/2016 1:16 PM  [name], MD  [name], M.D.  [name], MD  [name], MD    Principle Diagnosis: This is a 50 year old gentleman with stage IV renal cell carcinoma diagnosed in 2009.  Prior Therapy: 1. Status post laparoscopic radical nephrectomy.  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  2. Patient status post thoracotomy for a synchronous metastatic lung lesions done October 2009.  He had a lower lobe nodule, biopsy proven to be metastatic renal cell carcinoma.   3. Patient is status post stereotactic radiotherapy to pulmonary nodules in May of 2010. 4. He is S/P Sutent 50 mg 4 weeks on 2 weeks off from 10/2010 to 03/2013. He progressed at that time.  5. He is S/P radiation to the right sacral bone between 4/22 to 4/30.  6. He is S/P XRT to the left shoulder 03/20/14 to 03/31/14. 7. Votrient 800 mg by mouth daily from 03/2013 through 06/22/2015. Discontinued secondary to disease progression. 8. Nivolumab 3 mg/kg given every 2 weeks started on 06/29/2015. He is status post 4 cycles completed 08/10/2015. He developed disease progression in September 2016.  9. Status post radiation therapy to the left mid fibula completed on 11/14/2015. He received a grade 1 fraction.    Current therapy:  Cabometyx 60 mg daily started in November 2016.   Interim History:  Mr. Boodram presents today for a followup visit. Since his last visit, he reports doing better since the last visit. His mood is much improved including increased activity and improved local life. His appetite is better and have gained some more weight. He is enjoying Xarelto activities including fishing with his friends.    He continues to take Cabometyx without any other complications. His diarrhea is manageable with Imodium and Lomotil.  His bowel movements ranged between 2 to 5 a day. He denied  any hand-foot syndrome, fatigue or other complications. His quality of life remains stable.He continues to use Lovenox without any issues. He denied any bleeding or thrombosis episode. He does not report any difficulties with self injection at this time.  He did not report any headaches blurred vision or double vision. Does not report any seizure activity or alteration of mental status. Does not report any abdominal pain or hematochezia. Does not report any hematuria but does report occasional hesitancy and nocturia. He does not report any rashes or lesions or petechiae. He does not report any lymphadenopathy.  Remainder of his review of system is unremarkable.  Medications: I have reviewed the patient's current medications.  Current Outpatient Prescriptions  Medication Sig Dispense Refill  . ALPRAZolam (XANAX) 1 MG tablet Take 1 tablet (1 mg total) by mouth 3 (three) times daily as needed for anxiety. 90 tablet 0  . cabozantinib S-Malate (CABOMETYX) 60 MG TABS Take 60 mg by mouth daily. Take one tablet by mouth daily with at least 8 ounces of water on an empty stomach. Do not eat for 2 hours before or 1 hour after. Swallow whole. 30 tablet 0  . calcium carbonate (TUMS - DOSED IN MG ELEMENTAL CALCIUM) 500 MG chewable tablet Chew 1 tablet by mouth as needed for indigestion or heartburn.    . dextromethorphan (ROBITUSSIN MAXIMUM STRENGTH) 15 MG/5ML syrup Take 10 mLs (30 mg total) by mouth 4 (four) times daily as needed for cough. 120 mL 0  . diphenhydrAMINE (BENADRYL) 25 mg capsule Take  25 mg by mouth every 6 (six) hours as needed for itching.    . diphenoxylate-atropine (LOMOTIL) 2.5-0.025 MG tablet Take 1 tablet by mouth 4 (four) times daily as needed for diarrhea or loose stools. 30 tablet 0  . DULoxetine (CYMBALTA) 60 MG capsule TAKE ONE CAPSULE BY MOUTH TWICE A DAY 60 capsule 0  . enoxaparin (LOVENOX) 150 MG/ML injection INJECT 0.93 MLS (140 MG TOTAL) INTO THE SKIN DAILY. 30 Syringe 1  . feeding  supplement, ENSURE ENLIVE, (ENSURE ENLIVE) LIQD Take 237 mLs by mouth 2 (two) times daily between meals. 237 mL 12  . gabapentin (NEURONTIN) 300 MG capsule TAKE 1 CAPSULE THREE TIMES A DAY 90 capsule 0  . HYDROmorphone (DILAUDID) 8 MG tablet Take 1 tablet (8 mg total) by mouth every 4 (four) hours as needed for moderate pain or severe pain. 50 tablet 0  . ibuprofen (ADVIL,MOTRIN) 200 MG tablet Take 400 mg by mouth every 6 (six) hours as needed for fever, headache, moderate pain or cramping.    . insulin glargine (LANTUS) 100 UNIT/ML injection Inject 26 Units into the skin at bedtime.    [name] lactulose (CHRONULAC) 10 GM/15ML solution Take 30 mLs (20 g total) by mouth 2 (two) times daily. 240 mL 0  . metoprolol tartrate (LOPRESSOR) 25 MG tablet Take 1 tablet (25 mg total) by mouth 2 (two) times daily. 60 tablet 0  . senna-docusate (SENOKOT-S) 8.6-50 MG tablet Take 1 tablet by mouth 2 (two) times daily. 30 tablet 0  . tiZANidine (ZANAFLEX) 4 MG tablet Take 4 mg by mouth every 8 (eight) hours as needed for muscle spasms.   0  . VIAGRA 100 MG tablet Take 50 mg by mouth as needed for erectile dysfunction.      No current facility-administered medications for this visit.      Allergies:  Allergies  Allergen Reactions  . Ceftriaxone Hives  . Hydrocodone Swelling    Past Medical History, Surgical history, Social history, and Family History were reviewed and updated.   Physical Exam: Blood pressure 114/78, pulse 66, temperature 98.1 F (36.7 C), temperature source Oral, resp. rate 17, weight 187 lb 6 oz (85 kg), SpO2 100 %. ECOG: 1 General appearance: Alert, awake gentleman without distress. Head: Normocephalic, without obvious abnormality. No oral ulcers or lesions. Neck: no adenopathy, no masses.  Lymph nodes: Cervical, supraclavicular, and axillary nodes normal. Heart:regular rate and rhythm, S1, S2.  Lung: Clear throughout auscultation. No wheezes noted. Abdomen: soft, non-tender, without  masses or organomegaly no rebound or guarding. EXT:  Tender to touch on the lateral side of his left lower extremity.  Neuro: No focal deficits noted.   Lab Results: Lab Results  Component Value Date   WBC 5.5 09/02/2016   HGB 13.0 09/02/2016   HCT 39.9 09/02/2016   MCV 103.1 (H) 09/02/2016   PLT 131 (L) 09/02/2016   Impression and Plan:  This is a pleasant 50 year old gentleman with the following issues. 1. Metastatic renal cell carcinoma.  He has documented disease to the lung and the pancreas. He is status post multiple therapies. He is currently on Cabometyx and have tolerated it well. CT scan on 06/17/2016 showed stable disease without any evidence of progression. The plan is to continue the same dose and schedule on this medication given his reasonable tolerance and excellent response. Repeat imaging studies will be scheduled for October 2017. 2. Depression/Anxiety: He remains on Xanax and Cymbalta. His mood appears improved since last visit. 3. Weight loss: His  weight is improved with improvement in his mood. His bowel local life which helped his appetite and weight. 4. Left lower extremity pain and a mass: Chronic in nature and not changed. Seems to manageable at this time. 5. Hypertension: Controlled without antihypertensive medication. 6. Pulmonary embolism: He is currently on Lovenox without any recent complications of bleeding or thrombosis. He has no objections continuing this medication. 7. Prognosis: He does have an incurable malignancy however he responded well to treatment that the plan is to continue with this palliative approach. 8. Diarrhea: Continues to be manageable. I have refilled Lomotil with instructions how to use it. 9. Follow up: in 4 weeks.   [facility], MD 09/02/16

## 2016-09-02 NOTE — Telephone Encounter (Signed)
Avs report and schedule given per 09/02/16 los.

## 2016-09-09 ENCOUNTER — Other Ambulatory Visit: Payer: Self-pay | Admitting: Oncology

## 2016-09-12 ENCOUNTER — Other Ambulatory Visit: Payer: Self-pay | Admitting: Oncology

## 2016-09-13 DIAGNOSIS — C7800 Secondary malignant neoplasm of unspecified lung: Secondary | ICD-10-CM | POA: Diagnosis not present

## 2016-09-15 ENCOUNTER — Encounter: Payer: Self-pay | Admitting: Pharmacist

## 2016-09-15 NOTE — Progress Notes (Signed)
Oral Chemotherapy Pharmacist Encounter   Received notification from [employer] ([employer]) that patient's cabozatinib prescription's authorization would expite 09/27/16. New PA initiated with [employer], approved through 12/21/16 [id number]  Johny Drilling, PharmD, BCPS 09/15/2016  4:20 PM [facility] [phone]

## 2016-09-19 ENCOUNTER — Other Ambulatory Visit: Payer: Self-pay | Admitting: *Deleted

## 2016-09-19 MED ORDER — DULOXETINE HCL 60 MG PO CPEP: 60.0000 mg | ORAL_CAPSULE | Freq: Two times a day (BID) | ORAL | 0 refills | Status: DC

## 2016-09-23 DIAGNOSIS — R030 Elevated blood-pressure reading, without diagnosis of hypertension: Secondary | ICD-10-CM | POA: Diagnosis not present

## 2016-09-23 DIAGNOSIS — M5417 Radiculopathy, lumbosacral region: Secondary | ICD-10-CM | POA: Diagnosis not present

## 2016-09-23 DIAGNOSIS — M25551 Pain in right hip: Secondary | ICD-10-CM | POA: Diagnosis not present

## 2016-09-23 DIAGNOSIS — Z9689 Presence of other specified functional implants: Secondary | ICD-10-CM | POA: Diagnosis not present

## 2016-10-03 ENCOUNTER — Other Ambulatory Visit: Payer: Self-pay | Admitting: *Deleted

## 2016-10-03 DIAGNOSIS — C649 Malignant neoplasm of unspecified kidney, except renal pelvis: Secondary | ICD-10-CM

## 2016-10-03 MED ORDER — CABOZANTINIB S-MALATE 60 MG PO TABS: 60.0000 mg | ORAL_TABLET | Freq: Every day | ORAL | 0 refills | Status: DC

## 2016-10-09 ENCOUNTER — Other Ambulatory Visit: Payer: Self-pay | Admitting: *Deleted

## 2016-10-10 ENCOUNTER — Ambulatory Visit (HOSPITAL_COMMUNITY)
Admission: RE | Admit: 2016-10-10 | Discharge: 2016-10-10 | Disposition: A | Discharge status: Home / Self Care | Payer: Medicare Other | Source: Ambulatory Visit | Attending: Oncology | Admitting: Oncology

## 2016-10-10 ENCOUNTER — Other Ambulatory Visit (HOSPITAL_BASED_OUTPATIENT_CLINIC_OR_DEPARTMENT_OTHER): Payer: Medicare Other

## 2016-10-10 ENCOUNTER — Other Ambulatory Visit: Payer: Self-pay | Admitting: *Deleted

## 2016-10-10 DIAGNOSIS — C649 Malignant neoplasm of unspecified kidney, except renal pelvis: Secondary | ICD-10-CM | POA: Insufficient documentation

## 2016-10-10 DIAGNOSIS — M899 Disorder of bone, unspecified: Secondary | ICD-10-CM | POA: Insufficient documentation

## 2016-10-10 DIAGNOSIS — R918 Other nonspecific abnormal finding of lung field: Secondary | ICD-10-CM | POA: Diagnosis not present

## 2016-10-10 DIAGNOSIS — K869 Disease of pancreas, unspecified: Secondary | ICD-10-CM | POA: Insufficient documentation

## 2016-10-10 DIAGNOSIS — K8689 Other specified diseases of pancreas: Secondary | ICD-10-CM | POA: Diagnosis not present

## 2016-10-10 DIAGNOSIS — C7951 Secondary malignant neoplasm of bone: Secondary | ICD-10-CM

## 2016-10-10 DIAGNOSIS — F419 Anxiety disorder, unspecified: Secondary | ICD-10-CM

## 2016-10-10 DIAGNOSIS — K611 Rectal abscess: Secondary | ICD-10-CM

## 2016-10-10 LAB — COMPREHENSIVE METABOLIC PANEL
ALT: 42 U/L (ref 0–55)
AST: 27 U/L (ref 5–34)
Albumin: 3.3 g/dL — ABNORMAL LOW (ref 3.5–5.0)
Alkaline Phosphatase: 100 U/L (ref 40–150)
Anion Gap: 8 mEq/L (ref 3–11)
BUN: 9.8 mg/dL (ref 7.0–26.0)
CO2: 28 mEq/L (ref 22–29)
Calcium: 8.9 mg/dL (ref 8.4–10.4)
Chloride: 104 mEq/L (ref 98–109)
Creatinine: 1 mg/dL (ref 0.7–1.3)
EGFR: 87 mL/min/{1.73_m2} — ABNORMAL LOW (ref >90)
Glucose: 90 mg/dl (ref 70–140)
Potassium: 4.2 mEq/L (ref 3.5–5.1)
Sodium: 140 mEq/L (ref 136–145)
Total Bilirubin: <0.22 mg/dL (ref 0.20–1.20)
Total Protein: 6.9 g/dL (ref 6.4–8.3)

## 2016-10-10 LAB — CBC WITH DIFFERENTIAL/PLATELET
BASO%: 0.8 % (ref 0.0–2.0)
Basophils Absolute: 0 10*3/uL (ref 0.0–0.1)
EOS%: 7.6 % — ABNORMAL HIGH (ref 0.0–7.0)
Eosinophils Absolute: 0.5 10*3/uL (ref 0.0–0.5)
HCT: 41.3 % (ref 38.4–49.9)
HGB: 13.5 g/dL (ref 13.0–17.1)
LYMPH%: 32.3 % (ref 14.0–49.0)
MCH: 33 pg (ref 27.2–33.4)
MCHC: 32.6 g/dL (ref 32.0–36.0)
MCV: 101.3 fL — ABNORMAL HIGH (ref 79.3–98.0)
MONO#: 0.4 10*3/uL (ref 0.1–0.9)
MONO%: 6.3 % (ref 0.0–14.0)
NEUT#: 3.2 10*3/uL (ref 1.5–6.5)
NEUT%: 53 % (ref 39.0–75.0)
Platelets: 157 10*3/uL (ref 140–400)
RBC: 4.08 10*6/uL — ABNORMAL LOW (ref 4.20–5.82)
RDW: 16.4 % — ABNORMAL HIGH (ref 11.0–14.6)
WBC: 6 10*3/uL (ref 4.0–10.3)
lymph#: 1.9 10*3/uL (ref 0.9–3.3)

## 2016-10-10 MED ORDER — IOPAMIDOL (ISOVUE-300) INJECTION 61%
100.0000 mL | Freq: Once | INTRAVENOUS | Status: AC | PRN
  Administered 2016-10-10: 100 mL via INTRAVENOUS

## 2016-10-10 MED ORDER — GABAPENTIN 300 MG PO CAPS: 300.0000 mg | ORAL_CAPSULE | Freq: Three times a day (TID) | ORAL | 0 refills | Status: DC

## 2016-10-10 MED ORDER — ALPRAZOLAM 1 MG PO TABS: 1.0000 mg | ORAL_TABLET | Freq: Three times a day (TID) | ORAL | 0 refills | Status: DC | PRN

## 2016-10-13 DIAGNOSIS — C7800 Secondary malignant neoplasm of unspecified lung: Secondary | ICD-10-CM | POA: Diagnosis not present

## 2016-10-15 ENCOUNTER — Ambulatory Visit (HOSPITAL_BASED_OUTPATIENT_CLINIC_OR_DEPARTMENT_OTHER): Payer: Medicare Other | Admitting: Oncology

## 2016-10-15 VITALS — BP 123/89 | HR 75 | Temp 97.8°F | Resp 18 | Ht 75.0 in | Wt 188.8 lb

## 2016-10-15 DIAGNOSIS — C649 Malignant neoplasm of unspecified kidney, except renal pelvis: Secondary | ICD-10-CM | POA: Diagnosis not present

## 2016-10-15 DIAGNOSIS — C7889 Secondary malignant neoplasm of other digestive organs: Secondary | ICD-10-CM | POA: Diagnosis not present

## 2016-10-15 DIAGNOSIS — R197 Diarrhea, unspecified: Secondary | ICD-10-CM

## 2016-10-15 DIAGNOSIS — R2242 Localized swelling, mass and lump, left lower limb: Secondary | ICD-10-CM

## 2016-10-15 DIAGNOSIS — M79605 Pain in left leg: Secondary | ICD-10-CM

## 2016-10-15 DIAGNOSIS — C7951 Secondary malignant neoplasm of bone: Secondary | ICD-10-CM

## 2016-10-15 DIAGNOSIS — F418 Other specified anxiety disorders: Secondary | ICD-10-CM

## 2016-10-15 DIAGNOSIS — C7800 Secondary malignant neoplasm of unspecified lung: Secondary | ICD-10-CM | POA: Diagnosis not present

## 2016-10-15 DIAGNOSIS — I2699 Other pulmonary embolism without acute cor pulmonale: Secondary | ICD-10-CM

## 2016-10-15 NOTE — Progress Notes (Signed)
Hematology and Oncology Follow Up Visit  Keith [name] [id number] 07-Sep-1966 50 y.o. 10/15/2016 9:12 AM  [name], MD  [name], M.D.  [name], MD  [name], MD    Principle Diagnosis: This is a 50 year old gentleman with stage IV renal cell carcinoma diagnosed in 2009.  Prior Therapy: 1. Status post laparoscopic radical nephrectomy.  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  2. Patient status post thoracotomy for a synchronous metastatic lung lesions done October 2009.  He had a lower lobe nodule, biopsy proven to be metastatic renal cell carcinoma.   3. Patient is status post stereotactic radiotherapy to pulmonary nodules in May of 2010. 4. He is S/P Sutent 50 mg 4 weeks on 2 weeks off from 10/2010 to 03/2013. He progressed at that time.  5. He is S/P radiation to the right sacral bone between 4/22 to 4/30.  6. He is S/P XRT to the left shoulder 03/20/14 to 03/31/14. 7. Votrient 800 mg by mouth daily from 03/2013 through 06/22/2015. Discontinued secondary to disease progression. 8. Nivolumab 3 mg/kg given every 2 weeks started on 06/29/2015. He is status post 4 cycles completed 08/10/2015. He developed disease progression in September 2016.  9. Status post radiation therapy to the left mid fibula completed on 11/14/2015. He received a grade 1 fraction.    Current therapy:  Cabometyx 60 mg daily started in November 2016.   Interim History:  Keith [name] presents today for a followup visit. Since his last visit, he continues to do well and improvement in his overall quality of life. His appetite is better and have gained some more weight. He continues to take Cabometyx without any other complications. His diarrhea is manageable with Imodium and Lomotil. He denied any hand-foot syndrome, fatigue or other complications. His quality of life remains stable.  He continues to use Lovenox without any issues. He denied any bleeding or thrombosis  episode. He does not report any difficulties with self injection at this time. His pain is manageable at this time utilizing an infusion pump as well as as needed Dilaudid.   He did not report any headaches blurred vision or double vision. Does not report any seizure activity or alteration of mental status. Does not report any abdominal pain or hematochezia. Does not report any hematuria but does report occasional hesitancy and nocturia. He does not report any rashes or lesions or petechiae. He does not report any lymphadenopathy.  Remainder of his review of system is unremarkable.  Medications: I have reviewed the patient's current medications.  Current Outpatient Prescriptions  Medication Sig Dispense Refill  . ALPRAZolam (XANAX) 1 MG tablet Take 1 tablet (1 mg total) by mouth 3 (three) times daily as needed for anxiety. 90 tablet 0  . cabozantinib S-Malate (CABOMETYX) 60 MG TABS Take 60 mg by mouth daily. Take one tablet by mouth daily with at least 8 ounces of water on an empty stomach. Do not eat for 2 hours before or 1 hour after. Swallow whole. 30 tablet 0  . calcium carbonate (TUMS - DOSED IN MG ELEMENTAL CALCIUM) 500 MG chewable tablet Chew 1 tablet by mouth as needed for indigestion or heartburn.    . dextromethorphan (ROBITUSSIN MAXIMUM STRENGTH) 15 MG/5ML syrup Take 10 mLs (30 mg total) by mouth 4 (four) times daily as needed for cough. 120 mL 0  . diphenhydrAMINE (BENADRYL) 25 mg capsule Take 25 mg by mouth every 6 (six) hours as needed for  itching.    . diphenoxylate-atropine (LOMOTIL) 2.5-0.025 MG tablet Take 1 tablet by mouth 4 (four) times daily as needed for diarrhea or loose stools. 30 tablet 0  . DULoxetine (CYMBALTA) 60 MG capsule Take 1 capsule (60 mg total) by mouth 2 (two) times daily. 180 capsule 0  . enoxaparin (LOVENOX) 150 MG/ML injection INJECT 0.93 MLS (140 MG TOTAL) INTO THE SKIN DAILY. 30 Syringe 1  . feeding supplement, ENSURE ENLIVE, (ENSURE ENLIVE) LIQD Take 237 mLs  by mouth 2 (two) times daily between meals. 237 mL 12  . gabapentin (NEURONTIN) 300 MG capsule Take 1 capsule (300 mg total) by mouth 3 (three) times daily. 90 capsule 0  . HYDROmorphone (DILAUDID) 8 MG tablet Take 1 tablet (8 mg total) by mouth every 4 (four) hours as needed for moderate pain or severe pain. 50 tablet 0  . ibuprofen (ADVIL,MOTRIN) 200 MG tablet Take 400 mg by mouth every 6 (six) hours as needed for fever, headache, moderate pain or cramping.    . insulin glargine (LANTUS) 100 UNIT/ML injection Inject 26 Units into the skin at bedtime.    [name] lactulose (CHRONULAC) 10 GM/15ML solution Take 30 mLs (20 g total) by mouth 2 (two) times daily. 240 mL 0  . metoprolol tartrate (LOPRESSOR) 25 MG tablet Take 1 tablet (25 mg total) by mouth 2 (two) times daily. 60 tablet 0  . senna-docusate (SENOKOT-S) 8.6-50 MG tablet Take 1 tablet by mouth 2 (two) times daily. 30 tablet 0  . tiZANidine (ZANAFLEX) 4 MG tablet Take 4 mg by mouth every 8 (eight) hours as needed for muscle spasms.   0  . VIAGRA 100 MG tablet Take 50 mg by mouth as needed for erectile dysfunction.      No current facility-administered medications for this visit.      Allergies:  Allergies  Allergen Reactions  . Ceftriaxone Hives  . Hydrocodone Swelling    Past Medical History, Surgical history, Social history, and Family History were reviewed and updated.   Physical Exam: Blood pressure 123/89, pulse 75, temperature 97.8 F (36.6 C), temperature source Oral, resp. rate 18, height '6\' 3"'$  (1.905 m), weight 188 lb 12.8 oz (85.6 kg), SpO2 100 %. ECOG: 1 General appearance: Well-appearing gentleman appeared without distress. Head: Normocephalic, without obvious abnormality. No oral thrush noted. Neck: no adenopathy, no masses.  Lymph nodes: Cervical, supraclavicular, and axillary nodes normal. Heart:regular rate and rhythm, S1, S2.  Lung: Clear throughout auscultation. No wheezes noted. Abdomen: soft, non-tender,  without masses or organomegaly no tingling dullness or ascites. EXT:  Tender to touch on the lateral side of his left lower extremity. Unchanged from previous examination. Neuro: No focal deficits noted.   Lab Results: Lab Results  Component Value Date   WBC 6.0 10/10/2016   HGB 13.5 10/10/2016   HCT 41.3 10/10/2016   MCV 101.3 (H) 10/10/2016   PLT 157 10/10/2016   EXAM: CT ABDOMEN AND PELVIS WITHOUT AND WITH CONTRAST  TECHNIQUE: Multidetector CT imaging of the abdomen and pelvis was performed following the standard protocol before and following the bolus administration of intravenous contrast.  CONTRAST:  100 cc Isovue 300  COMPARISON:  06/17/2016.  FINDINGS: Lower chest: Right infrahilar soft tissue attenuation remains irregular but not substantially changed in the interval measuring 2.1 cm today at the same location it measured 2.3 cm previously. Right middle lobe volume loss again noted. The peripheral nodular densities identified posteriorly in the left lower lobe are not substantially changed with the smaller  of the 2 measuring 10 x 12 mm today on image 15 series 10 compared to 10 x 14 mm previously. Just caudal to this is the second lesion measured on the prior study at 13 x 29 mm which compares to 15 x 24 mm today.  Hepatobiliary: No focal abnormality within the liver parenchyma. There is no evidence for gallstones, gallbladder wall thickening, or pericholecystic fluid. No intrahepatic or extrahepatic biliary dilation.  Pancreas: Heterogeneously enhancing mass in the head of the pancreas measures 2.5 x 2.1 cm today compared to 2.9 x 2.4 cm previously. Hypervascular lesion body the pancreas is 15 x 9 mm today compared to 14 x 9 mm previously. Main pancreatic duct is dilated in the body and tail of the pancreas with a small hypervascular nodule near the junction of the body and tail.  Spleen: No splenomegaly. No focal mass lesion.  Adrenals/Urinary  Tract: No adrenal nodule or mass. Right kidney is unremarkable. Left kidney is surgically absent. Right ureter has normal imaging features. The urinary bladder appears normal for the degree of distention.  Stomach/Bowel: Stomach is nondistended. No gastric wall thickening. No evidence of outlet obstruction. Duodenum is normally positioned as is the ligament of Treitz. No small bowel wall thickening. No small bowel dilatation. The terminal ileum is normal. The appendix is normal. No gross colonic mass. No colonic wall thickening. No substantial diverticular change.  Vascular/Lymphatic: There is abdominal aortic atherosclerosis without aneurysm. There is no gastrohepatic or hepatoduodenal ligament lymphadenopathy. No intraperitoneal or retroperitoneal lymphadenopathy. No pelvic sidewall lymphadenopathy.  Reproductive: The prostate gland and seminal vesicles have normal imaging features.  Other: No intraperitoneal free fluid.  Musculoskeletal: Right sacral lesion measured previously at 2.5 x 3.3 cm now measures 2.4 x 3.4 cm. Right inferior pubic ramus lytic lesion is 2.3 cm today compared to 2.2 cm previously. Right femoral neck lesion seen on the previous is not substantially changed. Pain pump identified in the anterior subcutaneous soft tissues of the left abdomen.  IMPRESSION: 1. No substantial interval change in exam. Nodularity in the right infrahilar lung and posterior left lower lobe, hyper enhancing lesions of the pancreas and bony metastatic lesions are all similar in size and appearance with no clear trend ports progression or improvement.    Impression and Plan:  This is a pleasant 50 year old gentleman with the following issues. 1. Metastatic renal cell carcinoma.  He has documented disease to the lung and the pancreas. He is status post multiple therapies. He is currently on Cabometyx and have tolerated it well. CT scan on 10/10/2016 was personally reviewed  and continues to showe stable disease without any evidence of progression. The plan is to continue the same dose and schedule on this medication given his reasonable tolerance and excellent response. Repeat imaging studies will be scheduled in 4 months. 2. Depression/Anxiety: He remains on Xanax and Cymbalta. His mood appears improved since last visit. 3. Weight loss: He continues to do well and his weight has improved since last visit. 4. Left lower extremity pain and a mass: His pain is manageable with the current regimen. 5. Hypertension: Controlled without antihypertensive medication. 6. Pulmonary embolism: He is currently on Lovenox without any recent complications of bleeding or thrombosis. He has no objections continuing this medication. We have discussed the risks and benefits of continuing Lovenox and he agreed to continue. 7. Prognosis: He does have an incurable malignancy however he responded well to treatment that the plan is to continue with this palliative approach. He remains in  excellent health and performance status and would like to continue aggressive measures. 8. Diarrhea: Continues to be manageable. I have refilled Lomotil with instructions how to use it. 9. Follow up: in 4 weeks.   [facility], MD 10/15/16

## 2016-10-20 ENCOUNTER — Telehealth: Payer: Self-pay | Admitting: Oncology

## 2016-10-20 NOTE — Telephone Encounter (Signed)
Left message for patient re 11/30 appointments. Schedule mailed.

## 2016-10-28 IMAGING — CR DG HIP (WITH OR WITHOUT PELVIS) 2-3V*R*
3 series · 3 of 3 positions shown · non-contrast
Comparison: None.

CLINICAL DATA: History of metastatic renal cell carcinoma with
metastatic lesions in the lungs and pancreas. Today complains of
right leg numbness following radiation 1 year ago. Numbness has
worsened over the past 3 weeks. No injury.

EXAM:
DG HIP W/ PELVIS 2-3V*R*

[t pelvis ap]
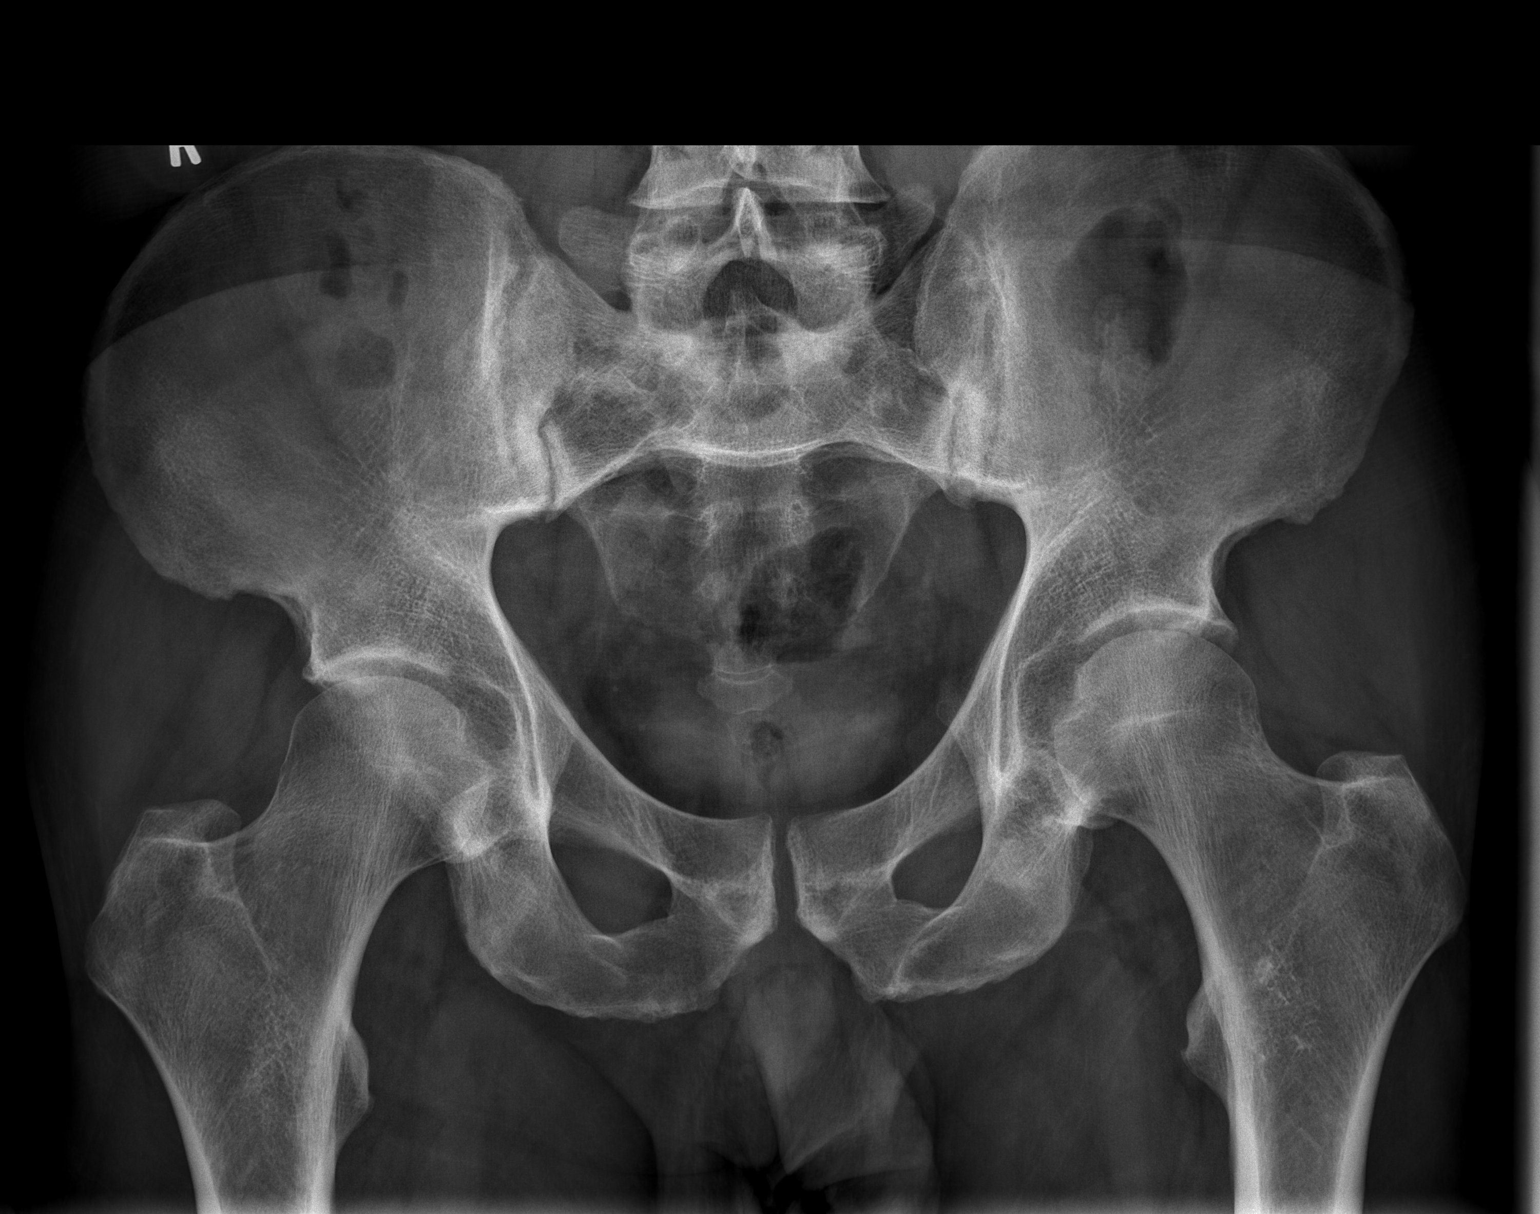

[t hip ap right]
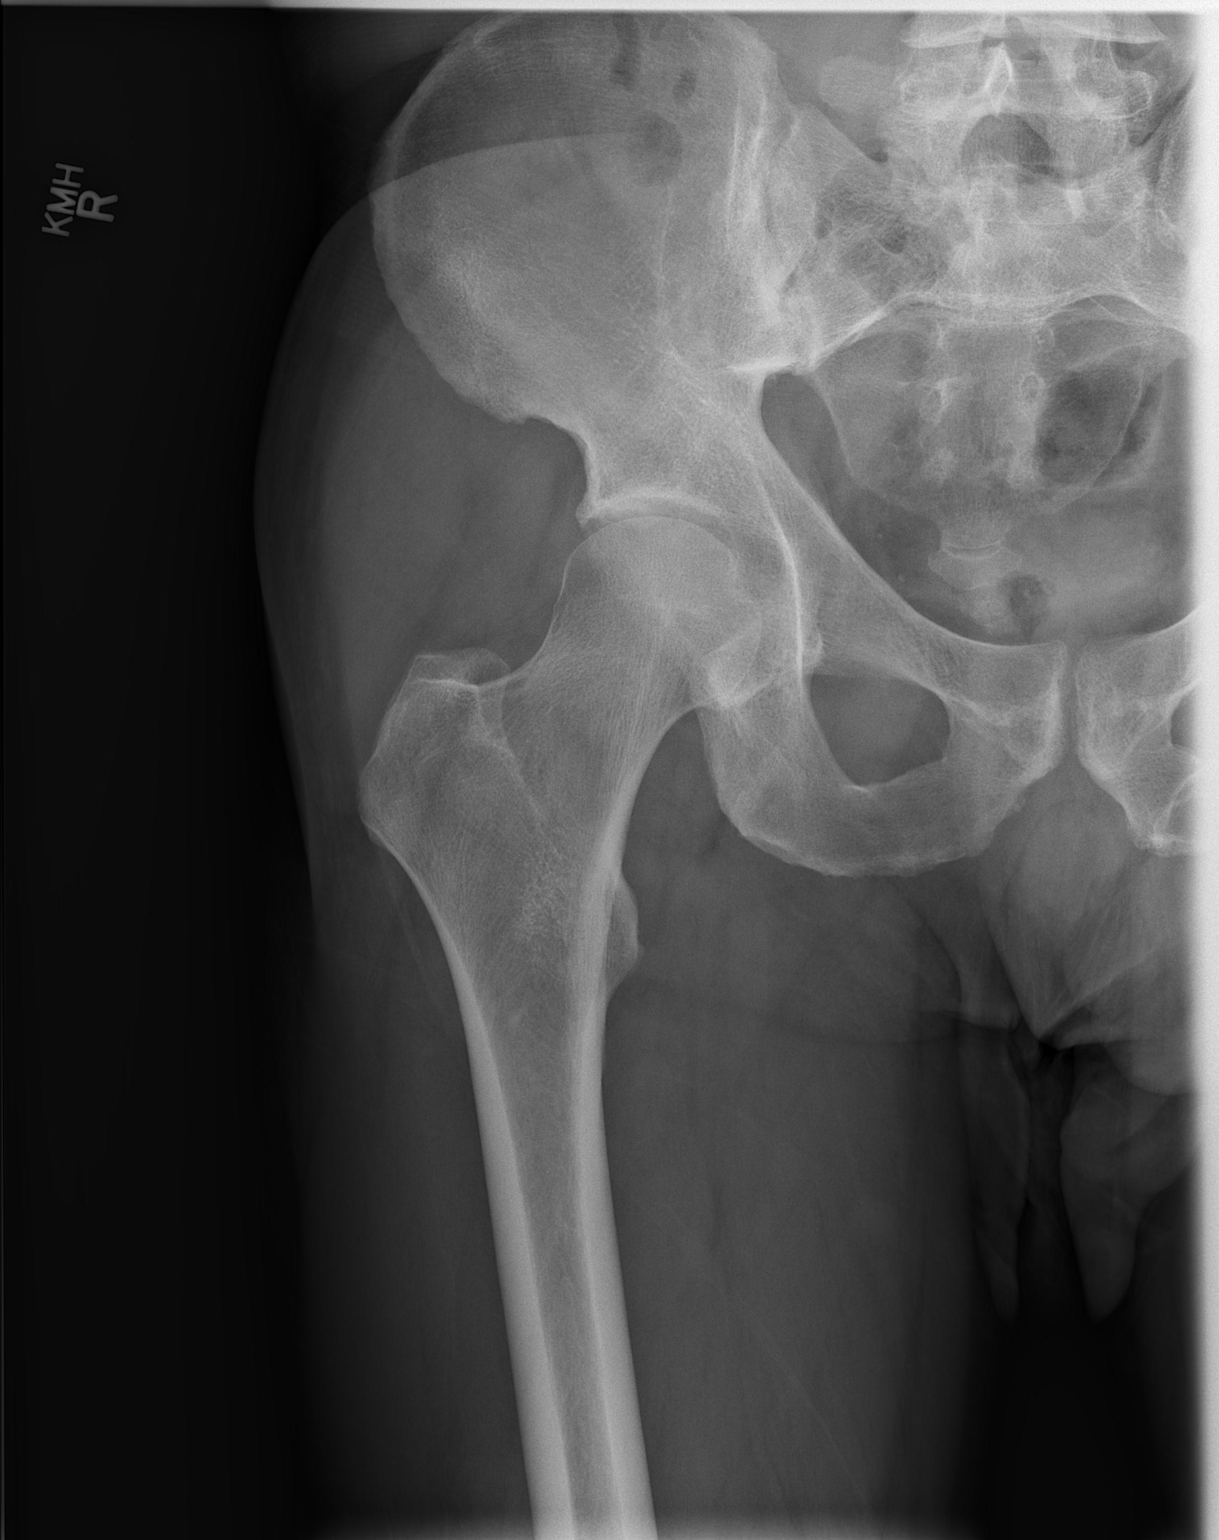

[t hip frog leg right]
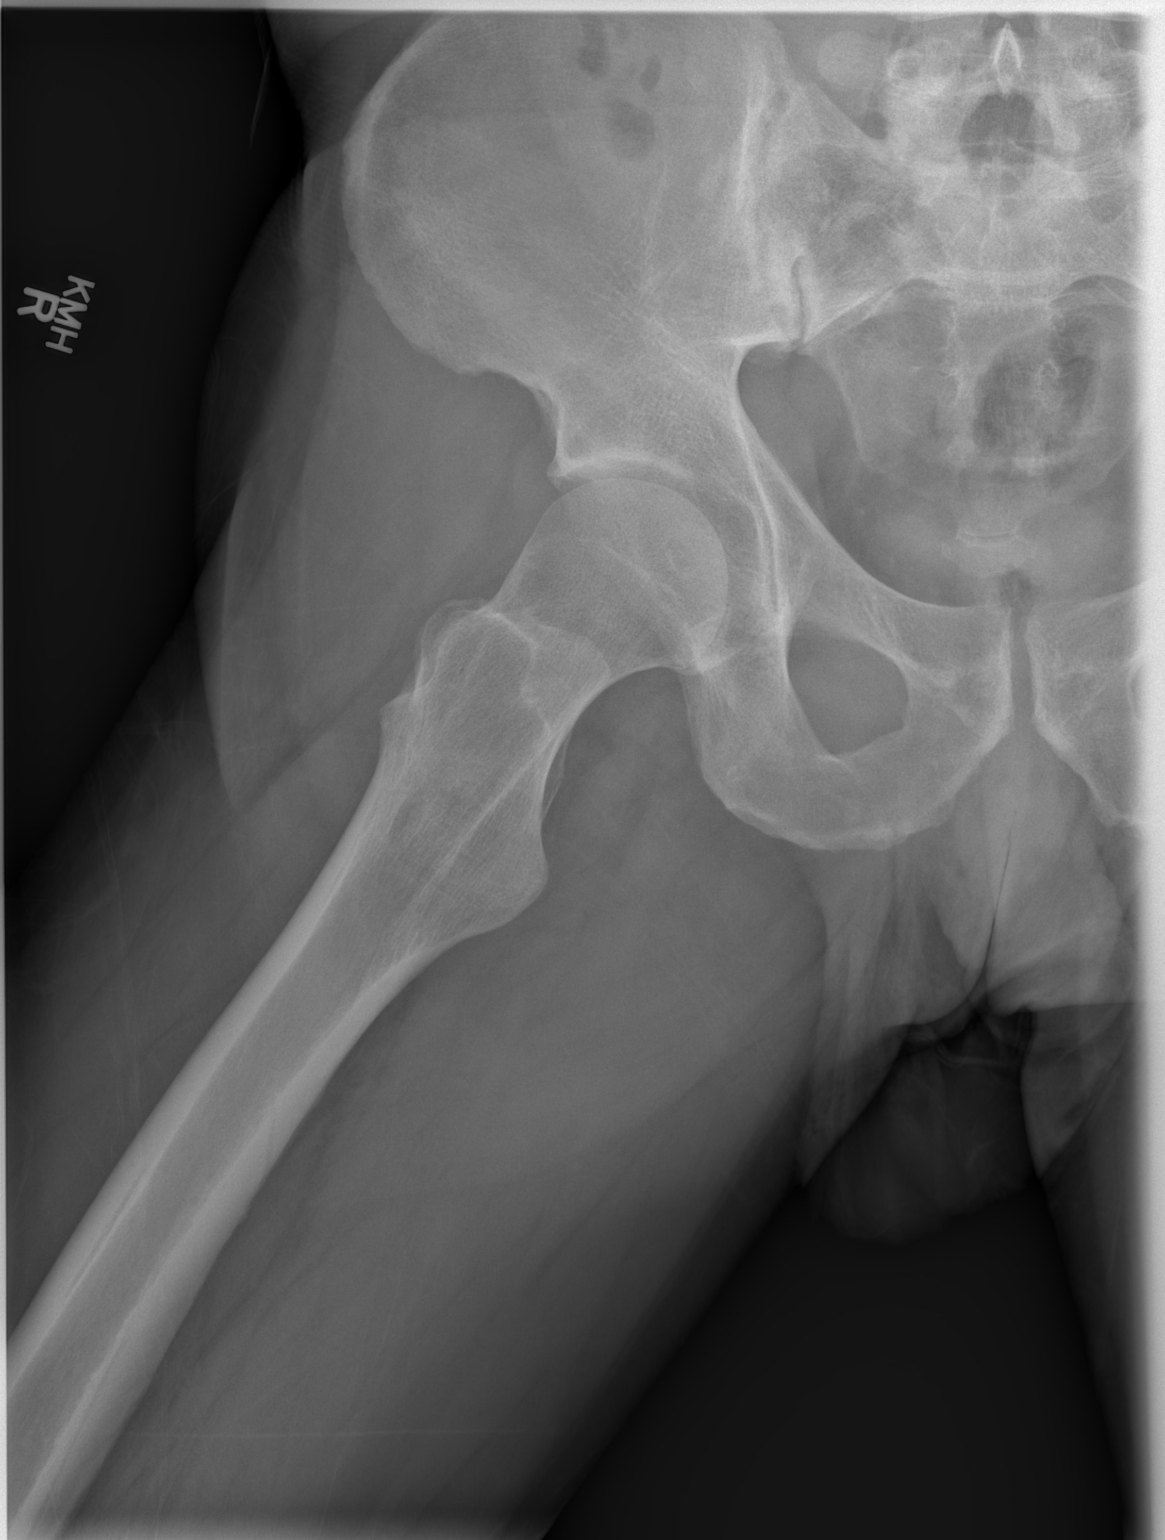

[3 of 3 positions shown; findings below may reference images not displayed]

FINDINGS: Pelvis and right hip appear intact. No evidence of acute fracture or
dislocation. Mild degenerative changes in the right hip. No
expansile or destructive bone lesions are appreciated. SI joints and
symphysis pubis are not displaced.
IMPRESSION: Mild degenerative changes in the right hip. No acute bony
abnormality suggested.

## 2016-10-28 IMAGING — MR MR TOTAL SPINE METS SCREENING
6 of 15 series · 17 of 48 positions shown · IV contrast (20    MULTIHANCE)
Comparison: CT of the abdomen and pelvis September 19, 2014

CLINICAL DATA: Metastatic renal cell carcinoma, status post LEFT
nephrectomy. RIGHT leg pain, numbness and swelling.

EXAM:
MRI TOTAL SPINE WITHOUT AND WITH CONTRAST
TECHNIQUE: Multisequence sagittal MR imaging of the spine from the cervical
spine to the sacrum was performed prior to and following IV contrast
administration for evaluation of spinal metastatic disease. Axial
sequences through select levels.
CONTRAST:  20mL MULTIHANCE GADOBENATE DIMEGLUMINE 529 MG/ML IV SOLN

[Series 5: T1 · sagittal · 3.0mm · 0.61mm/px · 3 of 15 slices shown]
[im 1/15]
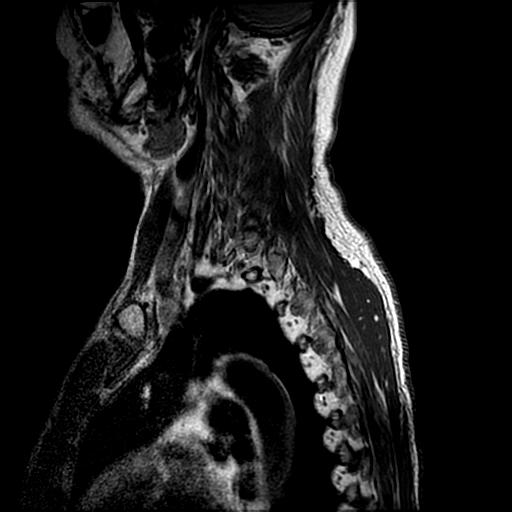
[im 5/15]
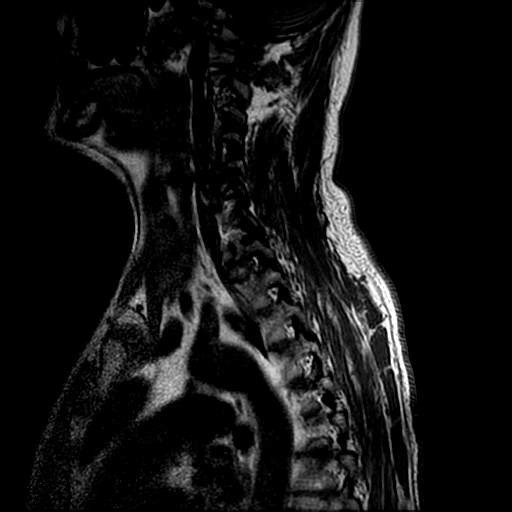
[im 10/15]
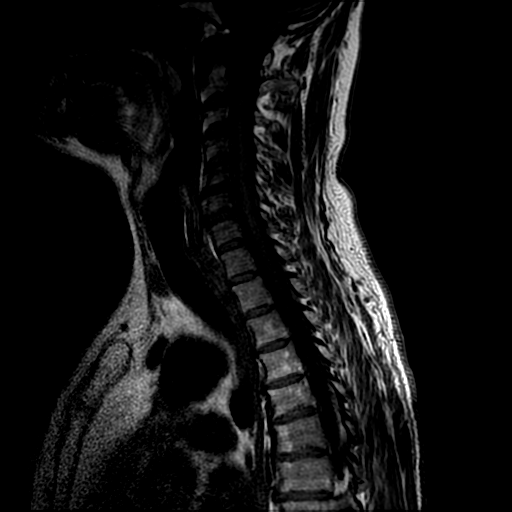

[Series 12: T2 post-contrast · sagittal · 4.0mm · 0.84mm/px · 3 of 15 slices shown (1 of 5)]
[im 1/15]
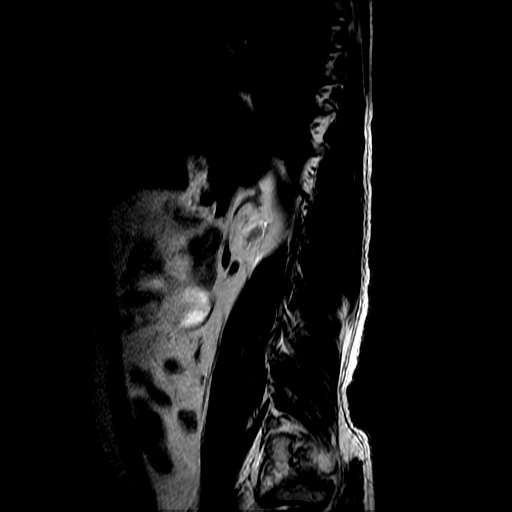
[im 8/15]
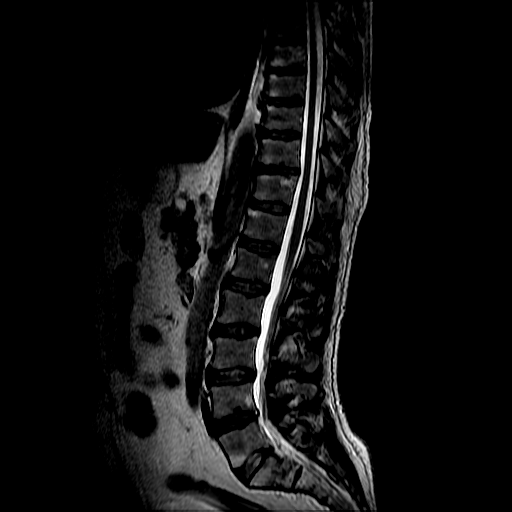
[im 15/15]
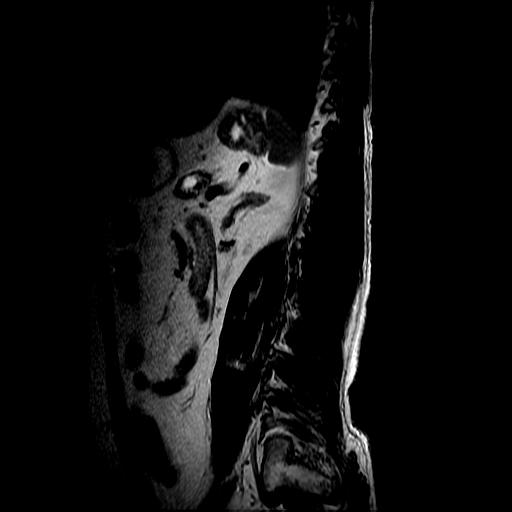

[Series 15: T2 post-contrast · sagittal · 3.0mm · 0.61mm/px · 3 of 15 slices shown (2 of 5)]
[im 1/15]
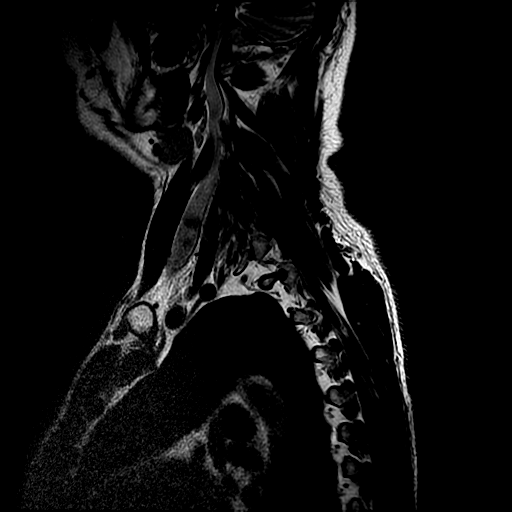
[im 8/15]
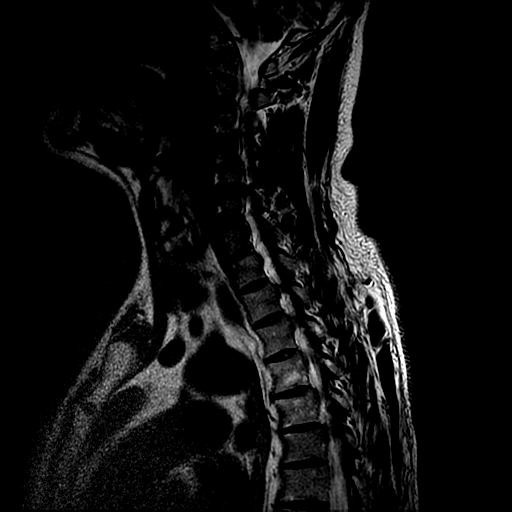
[im 15/15]
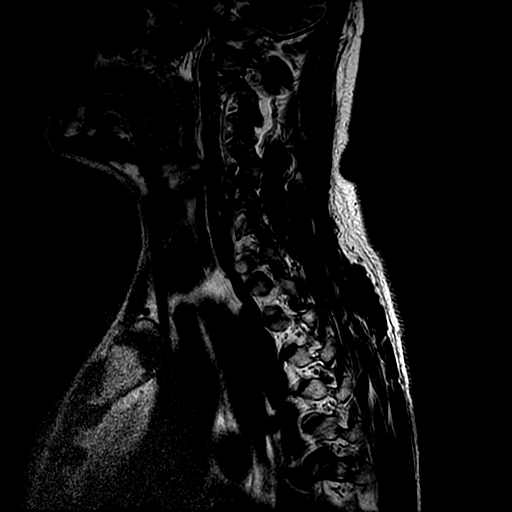

[Series 16: T2 post-contrast · axial · 4.0mm · 0.39mm/px · z∈[-22,+13]mm · 2 of 8 slices shown (3 of 5)]
[im 1/8]
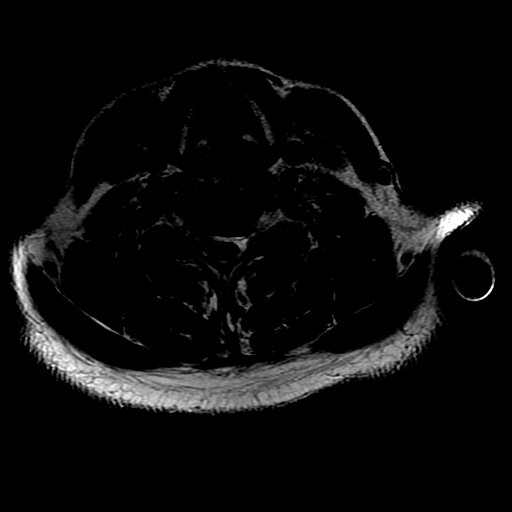
[im 8/8]
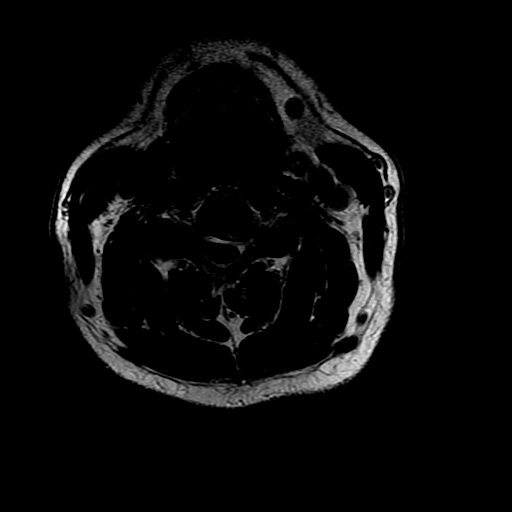

[Series 18: T2 post-contrast · axial · 4.0mm · 0.39mm/px · z∈[-337,-267]mm · 3 of 15 slices shown (4 of 5)]
[im 1/15]
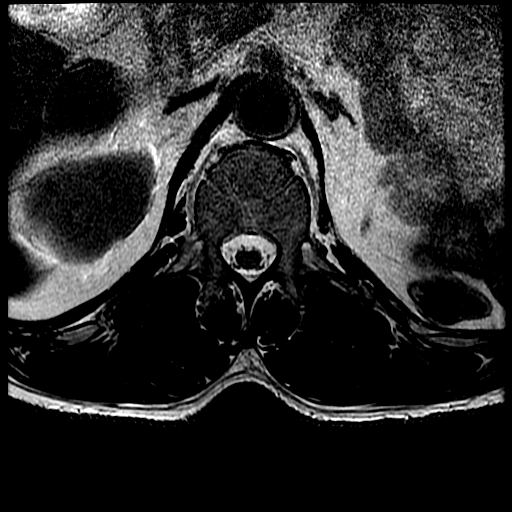
[im 8/15]
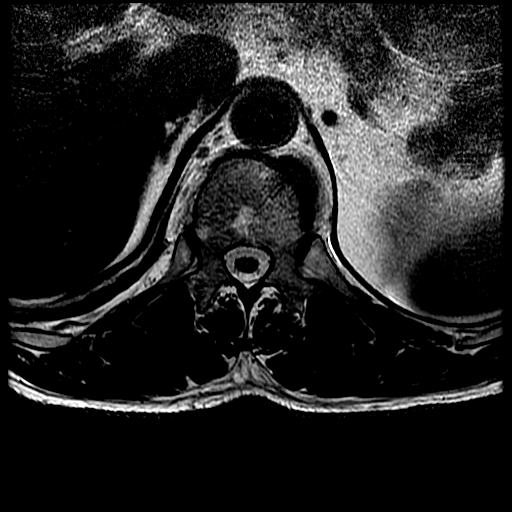
[im 15/15]
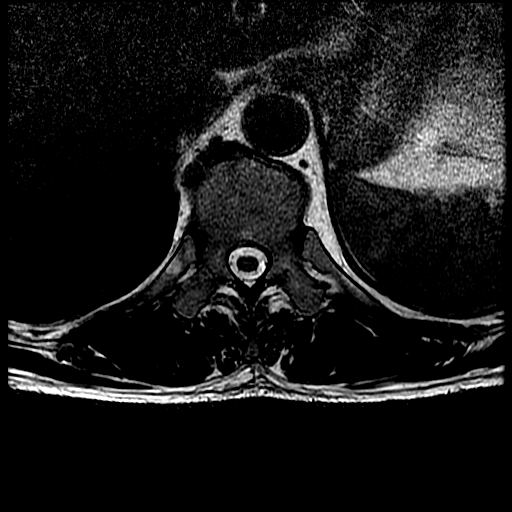

[Series 21: T2 post-contrast · axial · 4.0mm · 0.39mm/px · z∈[-621,-560]mm · 3 of 15 slices shown (5 of 5)]
[im 1/15]
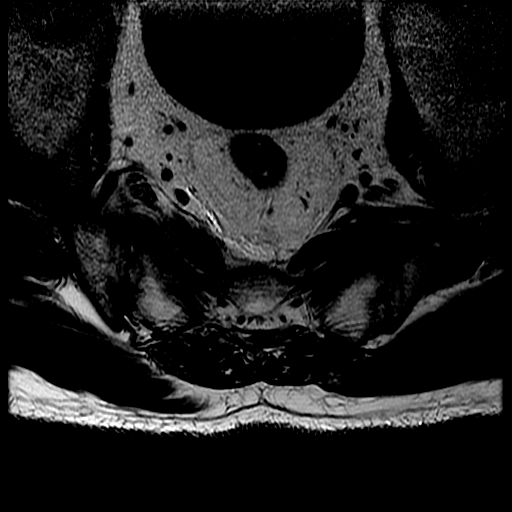
[im 8/15]
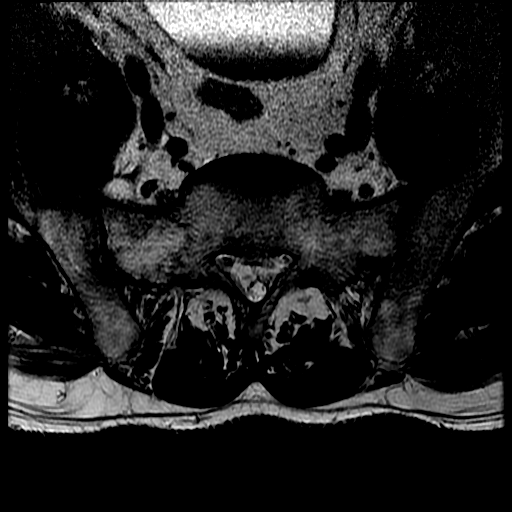
[im 15/15]
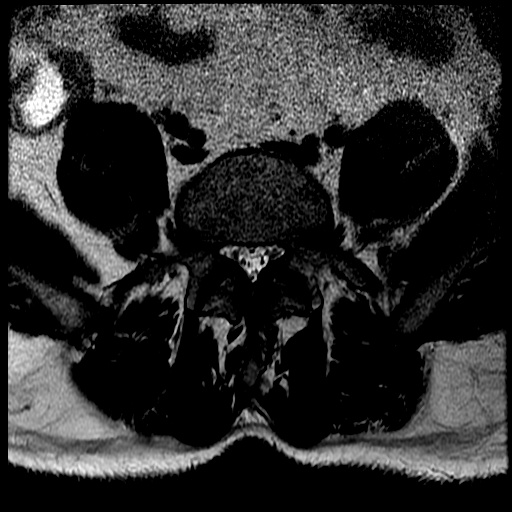

[17 of 48 positions shown; findings below may reference images not displayed]

FINDINGS: Cervical Findings:

Cervical vertebral bodies intact and aligned with maintenance of the
cervical lordosis. Moderate C5-6 disc height loss, moderate acute on
chronic discogenic endplate changes, bright STIR signal within the
C5-6 disc consistent with edema. No suspicious osseous or
intradiscal enhancement.

No abnormal cervical spinal cord signal nor enhancement. No abnormal
cord, leptomeningeal or epidural enhancement.

Though not tailored for evaluation, small LEFT subarticular disc
protrusion at C4-5 results in mild canal stenosis, mild to moderate
LEFT neural foraminal narrowing. At C5-6, moderate broad-based disc
bulge, uncovertebral hypertrophy and mild facet arthropathy result
in moderate canal stenosis, severe apparent LEFT greater than RIGHT
neural foraminal narrowing though, gradient sequence is not
obtained.

Included prevertebral and paraspinal soft tissues are nonsuspicious.

Thoracic Findings:

Thoracic vertebral bodies are intact and aligned. Maintenance of
thoracic kyphosis. Intervertebral discs demonstrate relatively
preserved morphology, decreased T2 signal within all discs most
consistent with mild desiccation. Multilevel mild subacute to
chronic discogenic endplate changes. Scattered chronic Schmorl's
nodes. Low T1, enhancing STIR signal within anterior aspect of T12.
No abnormal intradiscal enhancement.

No abnormal thoracic cord signal, no abnormal cord, leptomeningeal
nor epidural enhancement. Included prevertebral and paraspinal soft
tissues are nonsuspicious.

Lumbar Findings:

Lumbar vertebral bodies and posterior elements intact and aligned
with maintenance of lumbar lordosis. Please note, transitional
anatomy, lumbarized S1 vertebral body. Geographic bright T1 signal
within the sacrum suggests post radiation change. Expansile RIGHT
sacral mass, appears worse than prior study, as described below.
Lumbar discs demonstrate relatively preserved morphology, slight
decreased T2 signal consistent with desiccation at L4-5, with
enhancing annular fissure. Acute L3 inferior endplate Schmorl's
nodes in a background of chronic Schmorl's nodes.

Conus medullaris terminates at L1-2 and appears normal morphology
and signal characteristics. No abnormal cord, leptomeningeal nor
epidural enhancement.

Select axial sequences through the sacrum demonstrate a 3.1 x 2.3 cm
RIGHT sacral metastasis. Superimposed bright STIR signal and
enhancement within the RIGHT sciatic nerve. At least moderate
bilateral sacroiliac osteoarthrosis, incompletely imaged.
IMPRESSION: MRI CERVICAL SPINE: Limited examination for assessment of
metastasis, no MR findings of metastatic disease within the cervical
spine.

Degenerative changes cervical spine result in moderate canal
stenosis at C5-6 and apparent severe bilateral neural foraminal
narrowing at this level.

MRI THORACIC SPINE: Limited examination for assessment of
metastasis. Enhancement within the ventral aspect of T[DATE]
represent hemangioma, possibly metastasis without pathologic
fracture.

MRI LUMBAR SPINE: Limited examination for assessment of metastasis.
No MR findings of metastatic disease within the lumbar spine.
Suspected post radiation changes of the sacrum.

Expansile RIGHT sacral metastasis, with RIGHT sciatic nerve
enhancement favoring neuritis, less likely perineural tumor. No MR
findings of pathologic fractures.

  By: Nazareth Jumper

## 2016-11-01 IMAGING — CT CT ABD-PELV W/ CM
2 of 13 series · 12 of 46 positions shown, 18 images · IV contrast (OMNIPAQUE)
Comparison: 09/19/2014.

CLINICAL DATA: Renal cell carcinoma, pancreatic cancer. Left
nephrectomy and right lung resection. Ongoing chemotherapy.
Radiation therapy complete.

EXAM:
CT CHEST AND PELVIS WITH CONTRAST
CT ABDOMEN WITH AND WITHOUT CONTRAST
TECHNIQUE: Multidetector CT imaging of the chest and pelvis was performed
during intravenous contrast administration. Multidetector CT imaging
of the abdomen was performed following the standard protocol before
and during bolus administration of intravenous contrast.
CONTRAST:  100mL OMNIPAQUE IOHEXOL 300 MG/ML  SOLN

[Series 6: venous · axial · portal-venous · 0.74mm/px · z∈[-539,+43]mm · 10 of 234 slices shown, 15 images]
[im 20/234  soft-tissue]
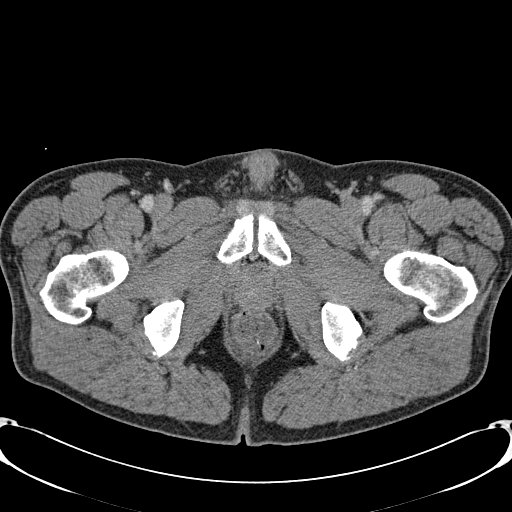
[im 20/234  bone]
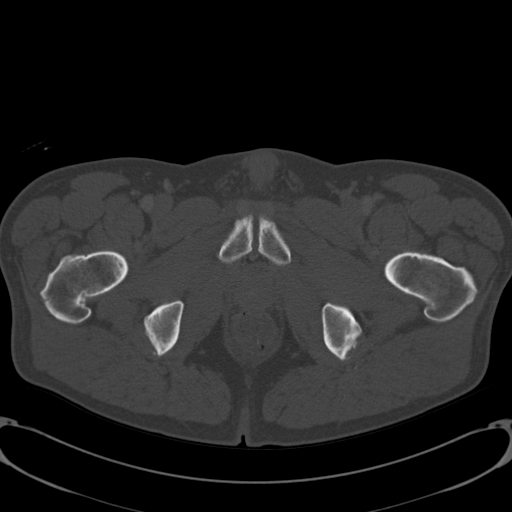
[im 39/234  soft-tissue]
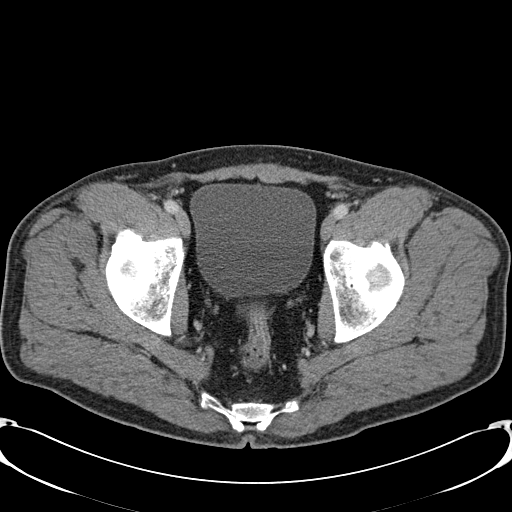
[im 78/234  soft-tissue]
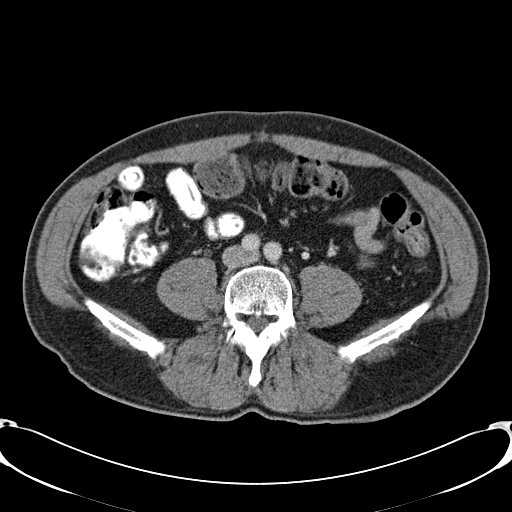
[im 98/234  soft-tissue]
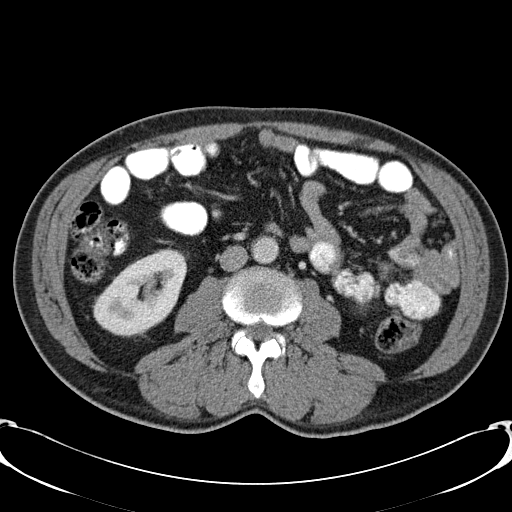
[im 117/234  soft-tissue]
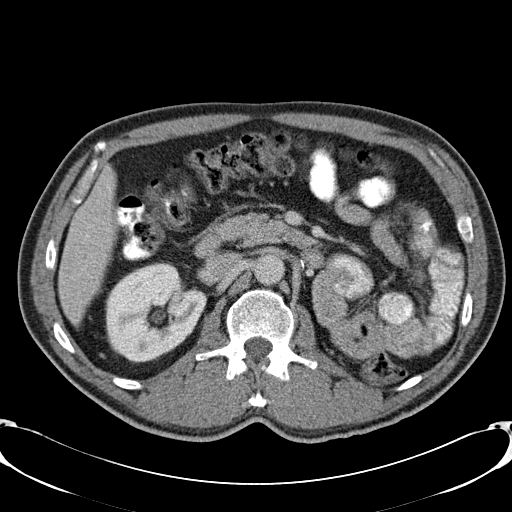
[im 136/234  soft-tissue]
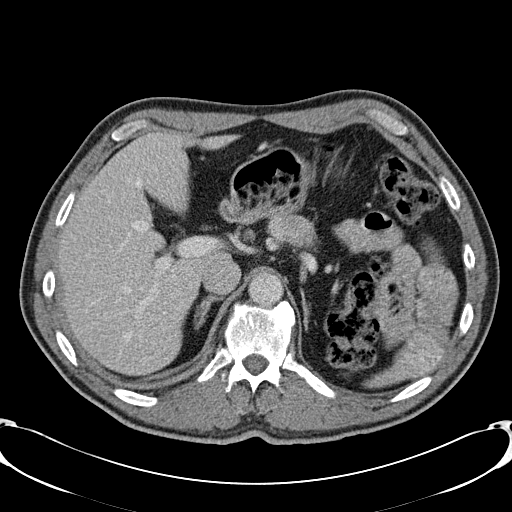
[im 156/234  soft-tissue]
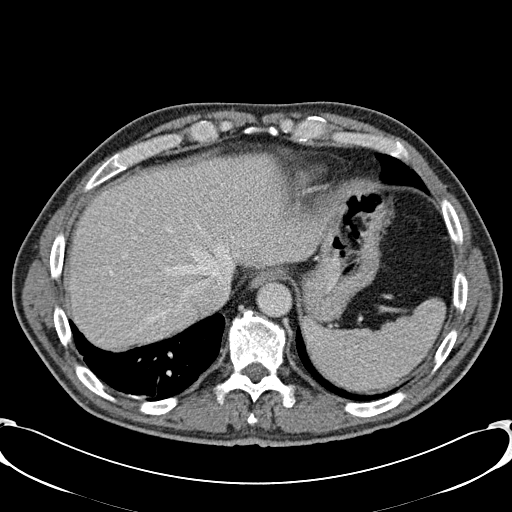
[im 156/234  lung]
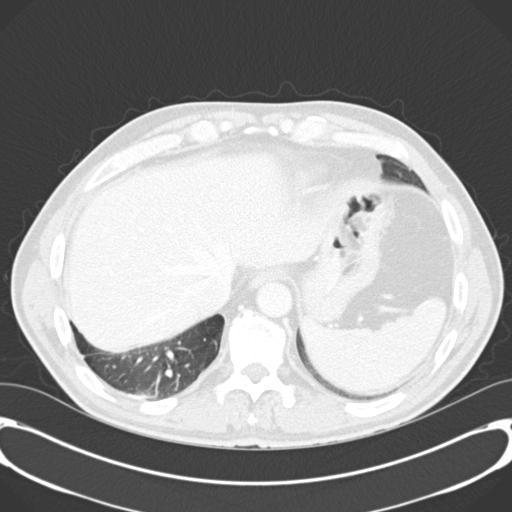
[im 175/234  lung]
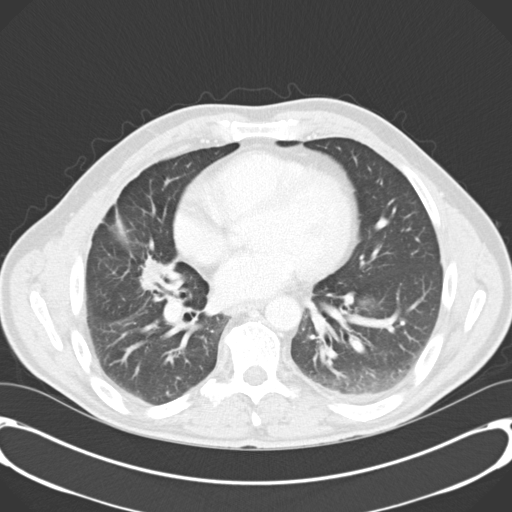
[im 195/234  soft-tissue]
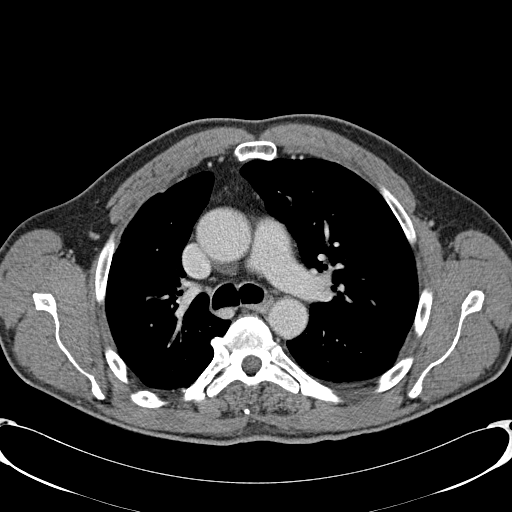
[im 195/234  lung]
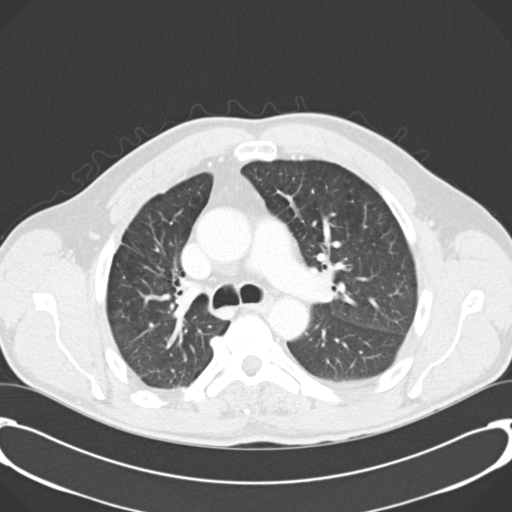
[im 214/234  soft-tissue]
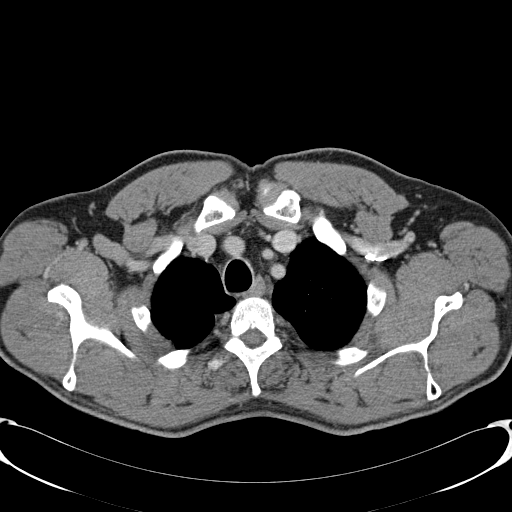
[im 214/234  lung]
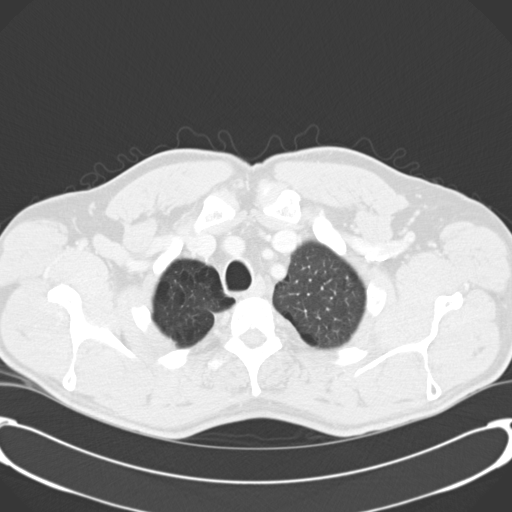
[im 214/234  bone]
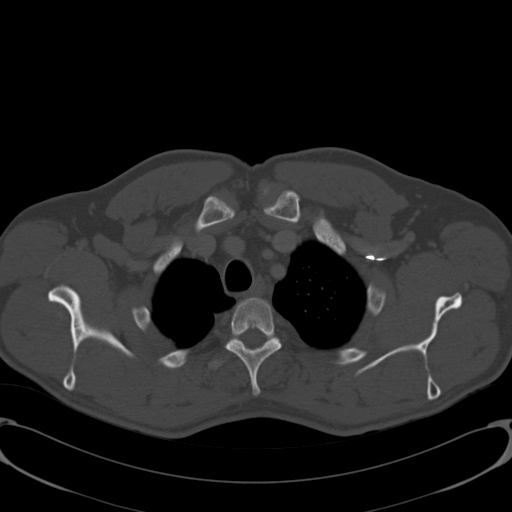

[Series 602: <mpr thick range> · coronal · 0.74mm/px · 2 of 89 slices shown, 3 images]
[im 30/89  soft-tissue]
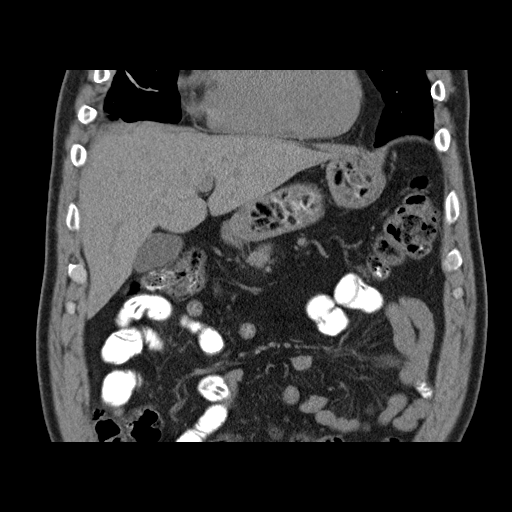
[im 30/89  bone]
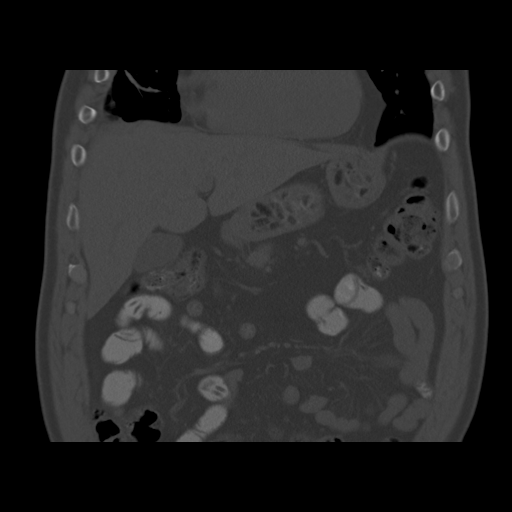
[im 59/89  soft-tissue]
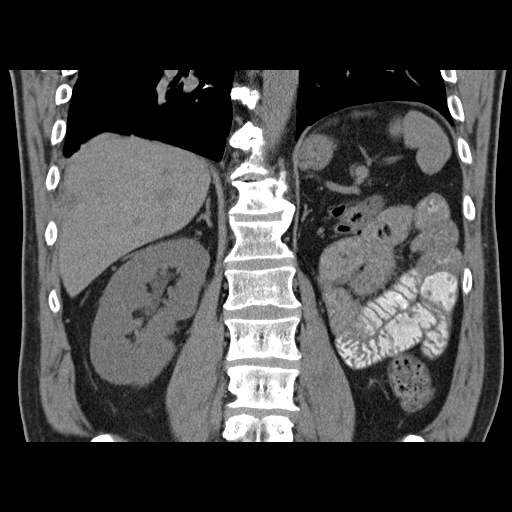

[12 of 46 positions shown; findings below may reference images not displayed]

FINDINGS: CT CHEST FINDINGS

No pathologically enlarged mediastinal lymph nodes. There may be a 9
mm left hilar lymph node. No axillary adenopathy. Heart is at the
upper limits of normal in size. No pericardial effusion.

Centrilobular and paraseptal emphysema with bullous changes at the
apex of the right hemi thorax. Postoperative changes of right upper
lobectomy. Nodular and linear soft tissue density in the upper right
hemi thorax is unchanged and presumably related to radiation
therapy. A nodular lesion in the central right middle lobe measures
approximately 1.4 x 2.2 cm (series 8, image 60), stable. Scattered
scarring and volume loss in the right hemi thorax, as before. 4 mm
subpleural right middle lobe nodule (image 48, series 8), unchanged.
Left lung is clear. No pleural fluid. Adherent debris is seen along
the lower trachea and right mainstem bronchus.

CT ABDOMEN AND PELVIS FINDINGS

Hepatobiliary: Liver and gallbladder are unremarkable. No biliary
ductal dilatation.

Pancreas: Pancreatic neck mass measures 1.7 x 2.3 cm, stable when
remeasured on the prior exam. Pancreatic body mass is somewhat
difficult to measure but appears roughly stable, measuring 1.9 x
cm (series 6, image 99). Associated ductal dilatation, measuring up
to 1 cm.

Spleen: Negative.

Adrenals/Urinary Tract: Adrenal glands are unremarkable. No urinary
stones. Right kidney is unremarkable. Right ureter is decompressed.
Left nephrectomy. Bladder is grossly unremarkable.

Stomach/Bowel: Stomach, small bowel, appendix and colon are
unremarkable.

Vascular/Lymphatic: Atherosclerotic calcification of the arterial
vasculature without abdominal aortic aneurysm. No vascular
encasement. No pathologically enlarged lymph nodes.

Reproductive: Prostate is normal in size.

Other: No free fluid.  Mesenteries and peritoneum are unremarkable.

Musculoskeletal: A lucent lesion in the right sacrum measures 2.3 x
3.0 cm, stable.
IMPRESSION: 1. Postoperative and postradiation changes in the right hemi thorax,
stable, without evidence of disease progression.
2. Stable pancreatic masses.
3. Lytic right sacral lesion, stable.

## 2016-11-04 ENCOUNTER — Other Ambulatory Visit: Payer: Self-pay | Admitting: *Deleted

## 2016-11-04 DIAGNOSIS — C649 Malignant neoplasm of unspecified kidney, except renal pelvis: Secondary | ICD-10-CM

## 2016-11-04 MED ORDER — CABOZANTINIB S-MALATE 60 MG PO TABS: 60.0000 mg | ORAL_TABLET | Freq: Every day | ORAL | 0 refills | Status: DC

## 2016-11-06 ENCOUNTER — Other Ambulatory Visit: Payer: Self-pay | Admitting: Oncology

## 2016-11-06 DIAGNOSIS — C649 Malignant neoplasm of unspecified kidney, except renal pelvis: Secondary | ICD-10-CM

## 2016-11-06 DIAGNOSIS — C7951 Secondary malignant neoplasm of bone: Secondary | ICD-10-CM

## 2016-11-06 DIAGNOSIS — K8689 Other specified diseases of pancreas: Secondary | ICD-10-CM

## 2016-11-06 DIAGNOSIS — K611 Rectal abscess: Secondary | ICD-10-CM

## 2016-11-06 DIAGNOSIS — F419 Anxiety disorder, unspecified: Secondary | ICD-10-CM

## 2016-11-13 DIAGNOSIS — C7800 Secondary malignant neoplasm of unspecified lung: Secondary | ICD-10-CM | POA: Diagnosis not present

## 2016-11-20 ENCOUNTER — Other Ambulatory Visit: Payer: Self-pay | Admitting: *Deleted

## 2016-11-20 ENCOUNTER — Other Ambulatory Visit (HOSPITAL_BASED_OUTPATIENT_CLINIC_OR_DEPARTMENT_OTHER): Payer: Medicare Other

## 2016-11-20 ENCOUNTER — Ambulatory Visit (HOSPITAL_BASED_OUTPATIENT_CLINIC_OR_DEPARTMENT_OTHER): Payer: Medicare Other | Admitting: Oncology

## 2016-11-20 VITALS — BP 139/90 | HR 75 | Temp 98.1°F | Resp 18 | Ht 75.0 in | Wt 188.7 lb

## 2016-11-20 DIAGNOSIS — R197 Diarrhea, unspecified: Secondary | ICD-10-CM

## 2016-11-20 DIAGNOSIS — M79605 Pain in left leg: Secondary | ICD-10-CM

## 2016-11-20 DIAGNOSIS — C7889 Secondary malignant neoplasm of other digestive organs: Secondary | ICD-10-CM

## 2016-11-20 DIAGNOSIS — C649 Malignant neoplasm of unspecified kidney, except renal pelvis: Secondary | ICD-10-CM

## 2016-11-20 DIAGNOSIS — I2699 Other pulmonary embolism without acute cor pulmonale: Secondary | ICD-10-CM

## 2016-11-20 DIAGNOSIS — C7800 Secondary malignant neoplasm of unspecified lung: Secondary | ICD-10-CM

## 2016-11-20 DIAGNOSIS — C7951 Secondary malignant neoplasm of bone: Secondary | ICD-10-CM | POA: Diagnosis not present

## 2016-11-20 DIAGNOSIS — R2242 Localized swelling, mass and lump, left lower limb: Secondary | ICD-10-CM

## 2016-11-20 DIAGNOSIS — F418 Other specified anxiety disorders: Secondary | ICD-10-CM

## 2016-11-20 LAB — COMPREHENSIVE METABOLIC PANEL
ALT: 31 U/L (ref 0–55)
AST: 20 U/L (ref 5–34)
Albumin: 3.2 g/dL — ABNORMAL LOW (ref 3.5–5.0)
Alkaline Phosphatase: 103 U/L (ref 40–150)
Anion Gap: 7 mEq/L (ref 3–11)
BUN: 12.4 mg/dL (ref 7.0–26.0)
CO2: 29 mEq/L (ref 22–29)
Calcium: 9.3 mg/dL (ref 8.4–10.4)
Chloride: 103 mEq/L (ref 98–109)
Creatinine: 1 mg/dL (ref 0.7–1.3)
EGFR: 86 mL/min/{1.73_m2} — ABNORMAL LOW (ref >90)
Glucose: 90 mg/dl (ref 70–140)
Potassium: 4.3 mEq/L (ref 3.5–5.1)
Sodium: 139 mEq/L (ref 136–145)
Total Bilirubin: 0.39 mg/dL (ref 0.20–1.20)
Total Protein: 7 g/dL (ref 6.4–8.3)

## 2016-11-20 LAB — CBC WITH DIFFERENTIAL/PLATELET
BASO%: 0.7 % (ref 0.0–2.0)
Basophils Absolute: 0 10*3/uL (ref 0.0–0.1)
EOS%: 6 % (ref 0.0–7.0)
Eosinophils Absolute: 0.4 10*3/uL (ref 0.0–0.5)
HCT: 44.1 % (ref 38.4–49.9)
HGB: 14.2 g/dL (ref 13.0–17.1)
LYMPH%: 30.4 % (ref 14.0–49.0)
MCH: 33.3 pg (ref 27.2–33.4)
MCHC: 32.2 g/dL (ref 32.0–36.0)
MCV: 103.5 fL — ABNORMAL HIGH (ref 79.3–98.0)
MONO#: 0.4 10*3/uL (ref 0.1–0.9)
MONO%: 6 % (ref 0.0–14.0)
NEUT#: 3.5 10*3/uL (ref 1.5–6.5)
NEUT%: 56.9 % (ref 39.0–75.0)
Platelets: 143 10*3/uL (ref 140–400)
RBC: 4.26 10*6/uL (ref 4.20–5.82)
RDW: 15.3 % — ABNORMAL HIGH (ref 11.0–14.6)
WBC: 6.2 10*3/uL (ref 4.0–10.3)
lymph#: 1.9 10*3/uL (ref 0.9–3.3)

## 2016-11-20 MED ORDER — DIPHENOXYLATE-ATROPINE 2.5-0.025 MG PO TABS: 1.0000 | ORAL_TABLET | Freq: Four times a day (QID) | ORAL | 0 refills | Status: DC | PRN

## 2016-11-20 NOTE — Progress Notes (Signed)
Hematology and Oncology Follow Up Visit  Keith [name] [id number] 07-16-1966 50 y.o. 11/20/2016 8:58 AM  [name], MD  [name], M.D.  [name], MD  [name], MD    Principle Diagnosis: This is a 50 year old gentleman with stage IV renal cell carcinoma diagnosed in 2009.  Prior Therapy: 1. Status post laparoscopic radical nephrectomy.  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  2. Patient status post thoracotomy for a synchronous metastatic lung lesions done October 2009.  He had a lower lobe nodule, biopsy proven to be metastatic renal cell carcinoma.   3. Patient is status post stereotactic radiotherapy to pulmonary nodules in May of 2010. 4. He is S/P Sutent 50 mg 4 weeks on 2 weeks off from 10/2010 to 03/2013. He progressed at that time.  5. He is S/P radiation to the right sacral bone between 4/22 to 4/30.  6. He is S/P XRT to the left shoulder 03/20/14 to 03/31/14. 7. Votrient 800 mg by mouth daily from 03/2013 through 06/22/2015. Discontinued secondary to disease progression. 8. Nivolumab 3 mg/kg given every 2 weeks started on 06/29/2015. He is status post 4 cycles completed 08/10/2015. He developed disease progression in September 2016.  9. Status post radiation therapy to the left mid fibula completed on 11/14/2015. He received a grade 1 fraction.    Current therapy:  Cabometyx 60 mg daily started in November 2016.   Interim History:  Keith [name] presents today for a followup visit. Since his last visit, he reports no major changes in his health. He does report some occasional cramping in his left lower extremity but no pain or edema. His appetite is better and continues to maintain weight. He continues to take Cabometyx without any other complications. His diarrhea is manageable with Imodium and Lomotil. He denied any hand-foot syndrome, fatigue or decline in his quality of life or performance status. He is able to drive and  attend to her activities of daily living.  He continues to use Lovenox without any issues. He denied any bleeding or thrombosis episode. He denied any recent hospitalization or illnesses. He has no issues of continuing self injections.   He did not report any headaches blurred vision or double vision. Does not report any seizure activity or alteration of mental status. Does not report any abdominal pain or hematochezia. Does not report any hematuria but does report occasional hesitancy and nocturia. He does not report any rashes or lesions or petechiae. He does not report any lymphadenopathy.  Remainder of his review of system is unremarkable.  Medications: I have reviewed the patient's current medications.  Current Outpatient Prescriptions  Medication Sig Dispense Refill  . ALPRAZolam (XANAX) 1 MG tablet TAKE 1 TABLET BY MOUTH BY MOUTH 3 TIMES DAILY A NEEDED FOR ANXIETY 90 tablet 0  . cabozantinib S-Malate (CABOMETYX) 60 MG TABS Take 60 mg by mouth daily. Take one tablet by mouth daily with at least 8 ounces of water on an empty stomach. Do not eat for 2 hours before or 1 hour after. Swallow whole. 30 tablet 0  . calcium carbonate (TUMS - DOSED IN MG ELEMENTAL CALCIUM) 500 MG chewable tablet Chew 1 tablet by mouth as needed for indigestion or heartburn.    . dextromethorphan (ROBITUSSIN MAXIMUM STRENGTH) 15 MG/5ML syrup Take 10 mLs (30 mg total) by mouth 4 (four) times daily as needed for cough. 120 mL 0  . diphenhydrAMINE (BENADRYL) 25 mg capsule Take 25 mg by  mouth every 6 (six) hours as needed for itching.    . diphenoxylate-atropine (LOMOTIL) 2.5-0.025 MG tablet Take 1 tablet by mouth 4 (four) times daily as needed for diarrhea or loose stools. 30 tablet 0  . DULoxetine (CYMBALTA) 60 MG capsule Take 1 capsule (60 mg total) by mouth 2 (two) times daily. 180 capsule 0  . enoxaparin (LOVENOX) 150 MG/ML injection INJECT 0.93 MLS (140 MG TOTAL) INTO THE SKIN DAILY. 30 Syringe 1  . feeding  supplement, ENSURE ENLIVE, (ENSURE ENLIVE) LIQD Take 237 mLs by mouth 2 (two) times daily between meals. 237 mL 12  . gabapentin (NEURONTIN) 300 MG capsule Take 1 capsule (300 mg total) by mouth 3 (three) times daily. 90 capsule 0  . HYDROmorphone (DILAUDID) 8 MG tablet Take 1 tablet (8 mg total) by mouth every 4 (four) hours as needed for moderate pain or severe pain. 50 tablet 0  . ibuprofen (ADVIL,MOTRIN) 200 MG tablet Take 400 mg by mouth every 6 (six) hours as needed for fever, headache, moderate pain or cramping.    . insulin glargine (LANTUS) 100 UNIT/ML injection Inject 26 Units into the skin at bedtime.    [name] lactulose (CHRONULAC) 10 GM/15ML solution Take 30 mLs (20 g total) by mouth 2 (two) times daily. 240 mL 0  . metoprolol tartrate (LOPRESSOR) 25 MG tablet Take 1 tablet (25 mg total) by mouth 2 (two) times daily. 60 tablet 0  . senna-docusate (SENOKOT-S) 8.6-50 MG tablet Take 1 tablet by mouth 2 (two) times daily. 30 tablet 0  . tiZANidine (ZANAFLEX) 4 MG tablet Take 4 mg by mouth every 8 (eight) hours as needed for muscle spasms.   0  . VIAGRA 100 MG tablet Take 50 mg by mouth as needed for erectile dysfunction.      No current facility-administered medications for this visit.      Allergies:  Allergies  Allergen Reactions  . Ceftriaxone Hives  . Hydrocodone Swelling    Past Medical History, Surgical history, Social history, and Family History were reviewed and updated.   Physical Exam: Blood pressure 139/90, pulse 75, temperature 98.1 F (36.7 C), temperature source Oral, resp. rate 18, height '6\' 3"'$  (1.905 m), weight 188 lb 11.2 oz (85.6 kg), SpO2 99 %. ECOG: 1 General appearance: Alert, awake gentleman without distress. Head: Normocephalic, without obvious abnormality. No oral ulcers or lesions. Neck: no adenopathy, no masses.  Lymph nodes: Cervical, supraclavicular, and axillary nodes normal. Heart:regular rate and rhythm, S1, S2.  Lung: Clear throughout  auscultation. No wheezes noted. Abdomen: soft, non-tender, without masses or organomegaly no tingling dullness or ascites. EXT:  No masses or lesions palpated no left lower extremity. Neuro: No focal deficits noted.   Lab Results: Lab Results  Component Value Date   WBC 6.2 11/20/2016   HGB 14.2 11/20/2016   HCT 44.1 11/20/2016   MCV 103.5 (H) 11/20/2016   PLT 143 11/20/2016    IMPRESSION: 1. No substantial interval change in exam. Nodularity in the right infrahilar lung and posterior left lower lobe, hyper enhancing lesions of the pancreas and bony metastatic lesions are all similar in size and appearance with no clear trend ports progression or improvement.    Impression and Plan:  This is a pleasant 50 year old gentleman with the following issues. 1. Metastatic renal cell carcinoma.  He has documented disease to the lung and the pancreas. He is status post multiple therapies. He is currently on Cabometyx and have tolerated it well. CT scan on 10/10/2016 showed  stable disease without any evidence of progression. The plan is to continue the same dose and schedule on this medication for the time being. We will repeat imaging studies in February 2018. He had an excellent improvement in his quality of life on this medication and the plan is to continue as long as he is responding. 2. Depression/Anxiety: He remains on Xanax and Cymbalta. His mood appears stable and appropriate. 3. Weight loss: He continues to do well and his weight remains stable. 4. Left lower extremity pain and a mass: His pain is manageable with the current regimen. 5. Hypertension: Controlled without antihypertensive medication. 6. Pulmonary embolism: He is currently on Lovenox without any recent complications of bleeding or thrombosis. I recommended continuing Lovenox indefinitely for the time being. 7. Prognosis: He does have an incurable malignancy however he responded well to treatment that the plan is to  continue with this palliative approach. He does have excellent performance status and the plan is to continue aggressive measures. 8. Diarrhea: Continues to be manageable. I have refilled Lomotil with instructions how to use it. 9. Follow up: in 4 weeks.   [facility], MD 11/20/16

## 2016-11-21 ENCOUNTER — Encounter: Payer: Self-pay | Admitting: Oncology

## 2016-11-21 NOTE — Progress Notes (Unsigned)
Received PA request for cabometyx via fax. Called [facility] listed on the letter and was transferred to the specialty pharmacy. Conducted PA over the phone with Keith [name].    PA approved#[id number] 12/22/16-12/21/17

## 2016-11-27 ENCOUNTER — Other Ambulatory Visit: Payer: Self-pay | Admitting: Oncology

## 2016-12-01 ENCOUNTER — Telehealth: Payer: Self-pay | Admitting: General Practice

## 2016-12-01 ENCOUNTER — Other Ambulatory Visit: Payer: Self-pay | Admitting: Oncology

## 2016-12-01 DIAGNOSIS — C649 Malignant neoplasm of unspecified kidney, except renal pelvis: Secondary | ICD-10-CM

## 2016-12-01 NOTE — Telephone Encounter (Signed)
Phone busy could not reach pt(tried several times).

## 2016-12-04 ENCOUNTER — Other Ambulatory Visit: Payer: Self-pay | Admitting: Oncology

## 2016-12-04 DIAGNOSIS — C7951 Secondary malignant neoplasm of bone: Secondary | ICD-10-CM

## 2016-12-04 DIAGNOSIS — K611 Rectal abscess: Secondary | ICD-10-CM

## 2016-12-04 DIAGNOSIS — F419 Anxiety disorder, unspecified: Secondary | ICD-10-CM

## 2016-12-04 DIAGNOSIS — C649 Malignant neoplasm of unspecified kidney, except renal pelvis: Secondary | ICD-10-CM

## 2016-12-04 DIAGNOSIS — K8689 Other specified diseases of pancreas: Secondary | ICD-10-CM

## 2016-12-09 DIAGNOSIS — Z9689 Presence of other specified functional implants: Secondary | ICD-10-CM | POA: Diagnosis not present

## 2016-12-09 DIAGNOSIS — M5417 Radiculopathy, lumbosacral region: Secondary | ICD-10-CM | POA: Diagnosis not present

## 2016-12-13 DIAGNOSIS — C7800 Secondary malignant neoplasm of unspecified lung: Secondary | ICD-10-CM | POA: Diagnosis not present

## 2016-12-18 ENCOUNTER — Other Ambulatory Visit: Payer: Self-pay | Admitting: Nurse Practitioner

## 2016-12-18 ENCOUNTER — Ambulatory Visit: Payer: Medicare Other | Admitting: Oncology

## 2016-12-18 ENCOUNTER — Other Ambulatory Visit: Payer: Medicare Other

## 2016-12-19 ENCOUNTER — Other Ambulatory Visit: Payer: Self-pay | Admitting: Oncology

## 2016-12-29 DIAGNOSIS — C642 Malignant neoplasm of left kidney, except renal pelvis: Secondary | ICD-10-CM | POA: Diagnosis not present

## 2016-12-29 DIAGNOSIS — M5417 Radiculopathy, lumbosacral region: Secondary | ICD-10-CM | POA: Diagnosis not present

## 2017-01-01 ENCOUNTER — Other Ambulatory Visit: Payer: Self-pay | Admitting: Oncology

## 2017-01-01 ENCOUNTER — Other Ambulatory Visit: Payer: Self-pay | Admitting: *Deleted

## 2017-01-01 DIAGNOSIS — C649 Malignant neoplasm of unspecified kidney, except renal pelvis: Secondary | ICD-10-CM

## 2017-01-01 DIAGNOSIS — C7951 Secondary malignant neoplasm of bone: Secondary | ICD-10-CM

## 2017-01-01 DIAGNOSIS — F419 Anxiety disorder, unspecified: Secondary | ICD-10-CM

## 2017-01-01 DIAGNOSIS — K611 Rectal abscess: Secondary | ICD-10-CM

## 2017-01-01 DIAGNOSIS — K8689 Other specified diseases of pancreas: Secondary | ICD-10-CM

## 2017-01-01 MED ORDER — ALPRAZOLAM 1 MG PO TABS: ORAL_TABLET | ORAL | 0 refills | Status: DC

## 2017-01-02 ENCOUNTER — Other Ambulatory Visit: Payer: Self-pay | Admitting: Oncology

## 2017-01-02 DIAGNOSIS — C649 Malignant neoplasm of unspecified kidney, except renal pelvis: Secondary | ICD-10-CM

## 2017-01-07 ENCOUNTER — Telehealth: Payer: Self-pay | Admitting: Oncology

## 2017-01-07 ENCOUNTER — Ambulatory Visit: Payer: Medicare Other | Admitting: Oncology

## 2017-01-07 ENCOUNTER — Other Ambulatory Visit: Payer: Medicare Other

## 2017-01-07 NOTE — Telephone Encounter (Signed)
Pt called to r/s appts due to weather. Gave pt new appt date/time

## 2017-01-10 ENCOUNTER — Other Ambulatory Visit: Payer: Self-pay | Admitting: Oncology

## 2017-01-13 ENCOUNTER — Telehealth: Payer: Self-pay | Admitting: *Deleted

## 2017-01-13 DIAGNOSIS — C7800 Secondary malignant neoplasm of unspecified lung: Secondary | ICD-10-CM | POA: Diagnosis not present

## 2017-01-13 NOTE — Telephone Encounter (Addendum)
"  This is [name] calling about my husband.  He had an episode last night.  He refused to go to the ER because he did not think he'd come back home.  Is this a side effect or is it normal for patients to have full body cramps?   He started cramping in his hands about 8:30 am yesterday and cramped all day.  About 8:30 yesterday evening he fell out in the kitchen floor with cramps in his stomach, arms, neck, legs, feet.  I could see his muscles bulging and veins too.  He was crying, scared curled up in fetal position with his hands drawn, yelling with agony.  He's had cramps before at night in back of his legs.  We tried tonic water as instructed but it tastes too bad.  Last night, I gave the Zanaflex muscle relaxant.  An hour later the morphine and ibuprofen until he finally was able to rest at 1:30 am.  Today he's sore, can hardley move.    Another problem he has and has not told anyone about is bad reflux for at least a week.   Has also passed a lot of red blood in stools."  Penny's return number [phone].  This nurse told her he should have gone to ED and still does need to go to the ED.  Will document information for provider and staff.

## 2017-01-13 NOTE — Telephone Encounter (Signed)
Called spouse again to instruct to go to t he ED.  Message left.  Reached Keith [name] who says."The cramps have eased off.  I feel better today.  I have Hemorrhoids, surgery three years ago for these high hemorrhoids.  They can bleed and get worse at times..  I have a cold and do not want to go to the hospital end up with pneumonia.  Thank you love."   Advised I do not want him at home in need of medical care.  He kindly ended call.

## 2017-02-03 ENCOUNTER — Other Ambulatory Visit: Payer: Self-pay | Admitting: Oncology

## 2017-02-03 DIAGNOSIS — C649 Malignant neoplasm of unspecified kidney, except renal pelvis: Secondary | ICD-10-CM

## 2017-02-04 ENCOUNTER — Telehealth: Payer: Self-pay | Admitting: Oncology

## 2017-02-04 ENCOUNTER — Other Ambulatory Visit (HOSPITAL_BASED_OUTPATIENT_CLINIC_OR_DEPARTMENT_OTHER): Payer: Medicare Other

## 2017-02-04 ENCOUNTER — Ambulatory Visit (HOSPITAL_BASED_OUTPATIENT_CLINIC_OR_DEPARTMENT_OTHER): Payer: Medicare Other | Admitting: Oncology

## 2017-02-04 ENCOUNTER — Other Ambulatory Visit: Payer: Self-pay | Admitting: *Deleted

## 2017-02-04 DIAGNOSIS — R197 Diarrhea, unspecified: Secondary | ICD-10-CM

## 2017-02-04 DIAGNOSIS — R2242 Localized swelling, mass and lump, left lower limb: Secondary | ICD-10-CM

## 2017-02-04 DIAGNOSIS — C7889 Secondary malignant neoplasm of other digestive organs: Secondary | ICD-10-CM

## 2017-02-04 DIAGNOSIS — K8689 Other specified diseases of pancreas: Secondary | ICD-10-CM

## 2017-02-04 DIAGNOSIS — I10 Essential (primary) hypertension: Secondary | ICD-10-CM

## 2017-02-04 DIAGNOSIS — K611 Rectal abscess: Secondary | ICD-10-CM

## 2017-02-04 DIAGNOSIS — C649 Malignant neoplasm of unspecified kidney, except renal pelvis: Secondary | ICD-10-CM

## 2017-02-04 DIAGNOSIS — C7800 Secondary malignant neoplasm of unspecified lung: Secondary | ICD-10-CM | POA: Diagnosis not present

## 2017-02-04 DIAGNOSIS — F418 Other specified anxiety disorders: Secondary | ICD-10-CM

## 2017-02-04 DIAGNOSIS — C7951 Secondary malignant neoplasm of bone: Secondary | ICD-10-CM | POA: Diagnosis not present

## 2017-02-04 DIAGNOSIS — F419 Anxiety disorder, unspecified: Secondary | ICD-10-CM

## 2017-02-04 DIAGNOSIS — I2699 Other pulmonary embolism without acute cor pulmonale: Secondary | ICD-10-CM

## 2017-02-04 DIAGNOSIS — M79605 Pain in left leg: Secondary | ICD-10-CM

## 2017-02-04 LAB — COMPREHENSIVE METABOLIC PANEL
ALT: 40 U/L (ref 0–55)
AST: 23 U/L (ref 5–34)
Albumin: 3.4 g/dL — ABNORMAL LOW (ref 3.5–5.0)
Alkaline Phosphatase: 87 U/L (ref 40–150)
Anion Gap: 8 mEq/L (ref 3–11)
BUN: 10.9 mg/dL (ref 7.0–26.0)
CO2: 31 mEq/L — ABNORMAL HIGH (ref 22–29)
Calcium: 9.2 mg/dL (ref 8.4–10.4)
Chloride: 103 mEq/L (ref 98–109)
Creatinine: 1.1 mg/dL (ref 0.7–1.3)
EGFR: 80 mL/min/{1.73_m2} — ABNORMAL LOW (ref >90)
Glucose: 99 mg/dl (ref 70–140)
Potassium: 4.4 mEq/L (ref 3.5–5.1)
Sodium: 141 mEq/L (ref 136–145)
Total Bilirubin: 0.24 mg/dL (ref 0.20–1.20)
Total Protein: 6.8 g/dL (ref 6.4–8.3)

## 2017-02-04 LAB — CBC WITH DIFFERENTIAL/PLATELET
BASO%: 0.8 % (ref 0.0–2.0)
Basophils Absolute: 0 10*3/uL (ref 0.0–0.1)
EOS%: 2.8 % (ref 0.0–7.0)
Eosinophils Absolute: 0.2 10*3/uL (ref 0.0–0.5)
HCT: 43.6 % (ref 38.4–49.9)
HGB: 14.5 g/dL (ref 13.0–17.1)
LYMPH%: 35.7 % (ref 14.0–49.0)
MCH: 34.2 pg — ABNORMAL HIGH (ref 27.2–33.4)
MCHC: 33.3 g/dL (ref 32.0–36.0)
MCV: 102.8 fL — ABNORMAL HIGH (ref 79.3–98.0)
MONO#: 0.3 10*3/uL (ref 0.1–0.9)
MONO%: 4.9 % (ref 0.0–14.0)
NEUT#: 3.1 10*3/uL (ref 1.5–6.5)
NEUT%: 55.8 % (ref 39.0–75.0)
Platelets: 184 10*3/uL (ref 140–400)
RBC: 4.24 10*6/uL (ref 4.20–5.82)
RDW: 17.1 % — ABNORMAL HIGH (ref 11.0–14.6)
WBC: 5.5 10*3/uL (ref 4.0–10.3)
lymph#: 2 10*3/uL (ref 0.9–3.3)

## 2017-02-04 MED ORDER — ALPRAZOLAM 1 MG PO TABS: ORAL_TABLET | ORAL | 0 refills | Status: DC

## 2017-02-04 MED ORDER — QUININE SULFATE 324 MG PO CAPS: 648.0000 mg | ORAL_CAPSULE | Freq: Three times a day (TID) | ORAL | 0 refills | Status: DC

## 2017-02-04 NOTE — Addendum Note (Signed)
Addended by: [name] on: 02/04/2017 04:34 PM   Modules accepted: Orders

## 2017-02-04 NOTE — Telephone Encounter (Signed)
2 bottles of contrast was given to patient per CT ordered. Appointments schedule per 02/04/17 los. Patient was also given a copy of the AVS report and appointment schedule per 02/04/17 los.

## 2017-02-04 NOTE — Progress Notes (Signed)
Hematology and Oncology Follow Up Visit  Bralyn Folkert [id number] [birth year] 51 y.o. 02/04/2017 2:38 PM  [name], MD  [name], M.D.  [name], MD  [name], MD    Principle Diagnosis: This is a 51 year old gentleman with stage IV renal cell carcinoma diagnosed in 2009.  Prior Therapy: 1. Status post laparoscopic radical nephrectomy.  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  2. Patient status post thoracotomy for a synchronous metastatic lung lesions done October 2009.  He had a lower lobe nodule, biopsy proven to be metastatic renal cell carcinoma.   3. Patient is status post stereotactic radiotherapy to pulmonary nodules in May of 2010. 4. He is S/P Sutent 50 mg 4 weeks on 2 weeks off from 10/2010 to 03/2013. He progressed at that time.  5. He is S/P radiation to the right sacral bone between 4/22 to 4/30.  6. He is S/P XRT to the left shoulder 03/20/14 to 03/31/14. 7. Votrient 800 mg by mouth daily from 03/2013 through 06/22/2015. Discontinued secondary to disease progression. 8. Nivolumab 3 mg/kg given every 2 weeks started on 06/29/2015. He is status post 4 cycles completed 08/10/2015. He developed disease progression in September 2016.  9. Status post radiation therapy to the left mid fibula completed on 11/14/2015. He received a grade 1 fraction.    Current therapy:  Cabometyx 60 mg daily started in November 2016.   Interim History:  Mr. Parekh presents today for a followup visit. Since his last visit, he reports few complaints He does report some occasional cramping associated with both upper and lower extremities. It is more generalized since last visit and has been occurring while periodically. He denied any neurological deficits. He tried taking tonic water which did not help his matter.  His appetite is better and continues to maintain weight. He continues to take Cabometyx without any other complications. His diarrhea is  manageable with Imodium and Lomotil. He denied any hand-foot syndrome, fatigue or decline in his quality of life or performance status. He is able to drive and attend to her activities of daily living.  He denied any recent thrombosis or bleeding episodes. He denied any recent hospitalization or illnesses.   He did not report any headaches blurred vision or double vision. Does not report any seizure activity or alteration of mental status. Does not report any abdominal pain or hematochezia. Does not report any hematuria but does report occasional hesitancy and nocturia. He does not report any rashes or lesions or petechiae. He does not report any lymphadenopathy.  Remainder of his review of system is unremarkable.  Medications: I have reviewed the patient's current medications.  Current Outpatient Prescriptions  Medication Sig Dispense Refill  . ALPRAZolam (XANAX) 1 MG tablet TAKE 1 TABLET THREE TIMES A DAY AS NEEDED FOR ANXIETY 90 tablet 0  . CABOMETYX 60 MG TABS TAKE 1 TABLET BY MOUTH 1 TIME DAILY WITH AT LEAST 8OZ OF WATER ON AN EMPTY STOMACH. DO NOT EAT FOR 2 HOURS BEFORE OR 1 HOUR AFTER. SWALLOW W 30 tablet 0  . calcium carbonate (TUMS - DOSED IN MG ELEMENTAL CALCIUM) 500 MG chewable tablet Chew 1 tablet by mouth as needed for indigestion or heartburn.    . dextromethorphan (ROBITUSSIN MAXIMUM STRENGTH) 15 MG/5ML syrup Take 10 mLs (30 mg total) by mouth 4 (four) times daily as needed for cough. 120 mL 0  . diphenhydrAMINE (BENADRYL) 25 mg capsule Take 25 mg by mouth every  6 (six) hours as needed for itching.    . diphenoxylate-atropine (LOMOTIL) 2.5-0.025 MG tablet Take 1 tablet by mouth 4 (four) times daily as needed for diarrhea or loose stools. 30 tablet 0  . DULoxetine (CYMBALTA) 60 MG capsule TAKE 1 CAPSULE (60 MG TOTAL) BY MOUTH 2 (TWO) TIMES DAILY. 180 capsule 0  . enoxaparin (LOVENOX) 150 MG/ML injection INJECT 0.93 MLS (140 MG TOTAL) INTO THE SKIN DAILY. 30 Syringe 1  . feeding  supplement, ENSURE ENLIVE, (ENSURE ENLIVE) LIQD Take 237 mLs by mouth 2 (two) times daily between meals. 237 mL 12  . gabapentin (NEURONTIN) 300 MG capsule TAKE 1 CAPSULE THREE TIMES A DAY 270 capsule 0  . HYDROmorphone (DILAUDID) 8 MG tablet Take 1 tablet (8 mg total) by mouth every 4 (four) hours as needed for moderate pain or severe pain. 50 tablet 0  . ibuprofen (ADVIL,MOTRIN) 200 MG tablet Take 400 mg by mouth every 6 (six) hours as needed for fever, headache, moderate pain or cramping.    . insulin glargine (LANTUS) 100 UNIT/ML injection Inject 26 Units into the skin at bedtime.    [name] lactulose (CHRONULAC) 10 GM/15ML solution Take 30 mLs (20 g total) by mouth 2 (two) times daily. 240 mL 0  . metoprolol tartrate (LOPRESSOR) 25 MG tablet Take 1 tablet (25 mg total) by mouth 2 (two) times daily. 60 tablet 0  . senna-docusate (SENOKOT-S) 8.6-50 MG tablet Take 1 tablet by mouth 2 (two) times daily. 30 tablet 0  . tiZANidine (ZANAFLEX) 4 MG tablet Take 4 mg by mouth every 8 (eight) hours as needed for muscle spasms.   0  . VIAGRA 100 MG tablet Take 50 mg by mouth as needed for erectile dysfunction.      No current facility-administered medications for this visit.      Allergies:  Allergies  Allergen Reactions  . Ceftriaxone Hives  . Hydrocodone Swelling    Past Medical History, Surgical history, Social history, and Family History were reviewed and updated.   Physical Exam: Blood pressure 124/89, pulse 83, temperature 98.3 F (36.8 C), temperature source Oral, resp. rate 18, height '6\' 3"'$  (1.905 m), weight 190 lb 14.4 oz (86.6 kg), SpO2 100 %. ECOG: 1 General appearance: Well-appearing gentleman appeared without distress. Head: Normocephalic, without obvious abnormality. No oral thrush noted. Neck: no adenopathy, no masses.  Lymph nodes: Cervical, supraclavicular, and axillary nodes normal. Heart:regular rate and rhythm, S1, S2.  Lung: Clear throughout auscultation. No wheezes  noted. Abdomen: Soft, nontender without rebound or guarding. No organomegaly or ascites. EXT:  No masses or lesions palpated no left lower extremity. Neuro: No focal deficits noted.   Lab Results: Lab Results  Component Value Date   WBC 5.5 02/04/2017   HGB 14.5 02/04/2017   HCT 43.6 02/04/2017   MCV 102.8 (H) 02/04/2017   PLT 184 02/04/2017      Impression and Plan:  This is a pleasant 51 year old gentleman with the following issues. 1. Metastatic renal cell carcinoma.  He has documented disease to the lung and the pancreas. He is status post multiple therapies. He is currently on Cabometyx and have tolerated it well. CT scan on 10/10/2016 showed stable disease without any evidence of progression. He will have repeat imaging studies in March 2018 for staging purposes. I recommended continuing the same dose and schedule for the time being. 2. Depression/Anxiety: He remains on Xanax and Cymbalta. His mood appears stable and appropriate. Xanax is refilled today. 3. Weight loss: This is  resolved at this time. 4. Left lower extremity pain and a mass: Pain is improved after a nerve block completed 3 weeks ago. 5. Hypertension: Controlled without antihypertensive medication. 6. Pulmonary embolism: He is currently on Lovenox without any recent complications of bleeding or thrombosis. I recommended continuing Lovenox indefinitely for the time being. 7. Prognosis: He does have an incurable malignancy however he responded well to treatment that the plan is to continue with this palliative approach. No changes at this time to warrant adjusting of his goals of care. 8. Diarrhea: Continues to be manageable.  9. Follow up: in 4 weeks.   [facility], MD 02/04/17

## 2017-02-11 ENCOUNTER — Telehealth: Payer: Self-pay | Admitting: *Deleted

## 2017-02-11 MED ORDER — ENOXAPARIN SODIUM 150 MG/ML ~~LOC~~ SOLN: SUBCUTANEOUS | 1 refills | Status: DC

## 2017-02-11 NOTE — Telephone Encounter (Signed)
"  Can you help?  I've been out of the Lovenox for three days. Pharmacy waiting to hear from you."    Will send refill sent to [facility]..

## 2017-02-13 DIAGNOSIS — C7800 Secondary malignant neoplasm of unspecified lung: Secondary | ICD-10-CM | POA: Diagnosis not present

## 2017-02-19 DIAGNOSIS — M5417 Radiculopathy, lumbosacral region: Secondary | ICD-10-CM | POA: Diagnosis not present

## 2017-02-19 DIAGNOSIS — M25551 Pain in right hip: Secondary | ICD-10-CM | POA: Diagnosis not present

## 2017-02-19 DIAGNOSIS — C642 Malignant neoplasm of left kidney, except renal pelvis: Secondary | ICD-10-CM | POA: Diagnosis not present

## 2017-02-19 DIAGNOSIS — R030 Elevated blood-pressure reading, without diagnosis of hypertension: Secondary | ICD-10-CM | POA: Diagnosis not present

## 2017-02-19 DIAGNOSIS — Z9689 Presence of other specified functional implants: Secondary | ICD-10-CM | POA: Diagnosis not present

## 2017-03-04 ENCOUNTER — Other Ambulatory Visit: Payer: Self-pay | Admitting: *Deleted

## 2017-03-04 DIAGNOSIS — K8689 Other specified diseases of pancreas: Secondary | ICD-10-CM

## 2017-03-04 DIAGNOSIS — F419 Anxiety disorder, unspecified: Secondary | ICD-10-CM

## 2017-03-04 DIAGNOSIS — C7951 Secondary malignant neoplasm of bone: Secondary | ICD-10-CM

## 2017-03-04 DIAGNOSIS — C649 Malignant neoplasm of unspecified kidney, except renal pelvis: Secondary | ICD-10-CM

## 2017-03-04 DIAGNOSIS — K611 Rectal abscess: Secondary | ICD-10-CM

## 2017-03-04 MED ORDER — ALPRAZOLAM 1 MG PO TABS: ORAL_TABLET | ORAL | 0 refills | Status: DC

## 2017-03-10 ENCOUNTER — Ambulatory Visit (HOSPITAL_COMMUNITY)
Admission: RE | Admit: 2017-03-10 | Discharge: 2017-03-10 | Disposition: A | Discharge status: Home / Self Care | Payer: Medicare Other | Source: Ambulatory Visit | Attending: Oncology | Admitting: Oncology

## 2017-03-10 ENCOUNTER — Encounter (HOSPITAL_COMMUNITY): Payer: Self-pay

## 2017-03-10 ENCOUNTER — Other Ambulatory Visit (HOSPITAL_BASED_OUTPATIENT_CLINIC_OR_DEPARTMENT_OTHER): Payer: Medicare Other

## 2017-03-10 DIAGNOSIS — K8689 Other specified diseases of pancreas: Secondary | ICD-10-CM

## 2017-03-10 DIAGNOSIS — K611 Rectal abscess: Secondary | ICD-10-CM

## 2017-03-10 DIAGNOSIS — K769 Liver disease, unspecified: Secondary | ICD-10-CM | POA: Diagnosis not present

## 2017-03-10 DIAGNOSIS — C7800 Secondary malignant neoplasm of unspecified lung: Secondary | ICD-10-CM | POA: Diagnosis not present

## 2017-03-10 DIAGNOSIS — C649 Malignant neoplasm of unspecified kidney, except renal pelvis: Secondary | ICD-10-CM

## 2017-03-10 DIAGNOSIS — F419 Anxiety disorder, unspecified: Secondary | ICD-10-CM

## 2017-03-10 DIAGNOSIS — R918 Other nonspecific abnormal finding of lung field: Secondary | ICD-10-CM | POA: Diagnosis not present

## 2017-03-10 DIAGNOSIS — I712 Thoracic aortic aneurysm, without rupture: Secondary | ICD-10-CM | POA: Insufficient documentation

## 2017-03-10 DIAGNOSIS — C7951 Secondary malignant neoplasm of bone: Secondary | ICD-10-CM

## 2017-03-10 DIAGNOSIS — C7889 Secondary malignant neoplasm of other digestive organs: Secondary | ICD-10-CM | POA: Diagnosis not present

## 2017-03-10 LAB — COMPREHENSIVE METABOLIC PANEL
ALT: 35 U/L (ref 0–55)
AST: 21 U/L (ref 5–34)
Albumin: 3.2 g/dL — ABNORMAL LOW (ref 3.5–5.0)
Alkaline Phosphatase: 88 U/L (ref 40–150)
Anion Gap: 9 mEq/L (ref 3–11)
BUN: 11 mg/dL (ref 7.0–26.0)
CO2: 29 mEq/L (ref 22–29)
Calcium: 8.8 mg/dL (ref 8.4–10.4)
Chloride: 103 mEq/L (ref 98–109)
Creatinine: 1 mg/dL (ref 0.7–1.3)
EGFR: 87 mL/min/{1.73_m2} — ABNORMAL LOW (ref >90)
Glucose: 99 mg/dl (ref 70–140)
Potassium: 4.2 mEq/L (ref 3.5–5.1)
Sodium: 141 mEq/L (ref 136–145)
Total Bilirubin: 0.25 mg/dL (ref 0.20–1.20)
Total Protein: 6.5 g/dL (ref 6.4–8.3)

## 2017-03-10 LAB — CBC WITH DIFFERENTIAL/PLATELET
BASO%: 0.5 % (ref 0.0–2.0)
Basophils Absolute: 0 10*3/uL (ref 0.0–0.1)
EOS%: 3.6 % (ref 0.0–7.0)
Eosinophils Absolute: 0.2 10*3/uL (ref 0.0–0.5)
HCT: 43.7 % (ref 38.4–49.9)
HGB: 14.1 g/dL (ref 13.0–17.1)
LYMPH%: 35.8 % (ref 14.0–49.0)
MCH: 34.1 pg — ABNORMAL HIGH (ref 27.2–33.4)
MCHC: 32.3 g/dL (ref 32.0–36.0)
MCV: 105.6 fL — ABNORMAL HIGH (ref 79.3–98.0)
MONO#: 0.3 10*3/uL (ref 0.1–0.9)
MONO%: 4.5 % (ref 0.0–14.0)
NEUT#: 3.7 10*3/uL (ref 1.5–6.5)
NEUT%: 55.6 % (ref 39.0–75.0)
Platelets: 162 10*3/uL (ref 140–400)
RBC: 4.14 10*6/uL — ABNORMAL LOW (ref 4.20–5.82)
RDW: 16.2 % — ABNORMAL HIGH (ref 11.0–14.6)
WBC: 6.7 10*3/uL (ref 4.0–10.3)
lymph#: 2.4 10*3/uL (ref 0.9–3.3)

## 2017-03-10 MED ORDER — IOPAMIDOL (ISOVUE-300) INJECTION 61%
INTRAVENOUS | Status: AC
  Filled 2017-03-10: qty 100

## 2017-03-10 MED ORDER — IOPAMIDOL (ISOVUE-300) INJECTION 61%
100.0000 mL | Freq: Once | INTRAVENOUS | Status: AC | PRN
  Administered 2017-03-10: 100 mL via INTRAVENOUS

## 2017-03-11 ENCOUNTER — Other Ambulatory Visit: Payer: Self-pay | Admitting: Oncology

## 2017-03-11 DIAGNOSIS — C649 Malignant neoplasm of unspecified kidney, except renal pelvis: Secondary | ICD-10-CM

## 2017-03-12 ENCOUNTER — Ambulatory Visit (HOSPITAL_BASED_OUTPATIENT_CLINIC_OR_DEPARTMENT_OTHER): Payer: Medicare Other | Admitting: Oncology

## 2017-03-12 VITALS — BP 134/89 | HR 74 | Temp 97.9°F | Resp 18 | Ht 75.0 in | Wt 189.4 lb

## 2017-03-12 DIAGNOSIS — C7800 Secondary malignant neoplasm of unspecified lung: Secondary | ICD-10-CM | POA: Diagnosis not present

## 2017-03-12 DIAGNOSIS — C649 Malignant neoplasm of unspecified kidney, except renal pelvis: Secondary | ICD-10-CM

## 2017-03-12 DIAGNOSIS — C7951 Secondary malignant neoplasm of bone: Secondary | ICD-10-CM | POA: Diagnosis not present

## 2017-03-12 DIAGNOSIS — C7889 Secondary malignant neoplasm of other digestive organs: Secondary | ICD-10-CM | POA: Diagnosis not present

## 2017-03-12 NOTE — Progress Notes (Signed)
Hematology and Oncology Follow Up Visit  Keith [name] [id number] 05-21-1966 51 y.o. 03/12/2017 12:09 PM  [name], MD  [name], M.D.  [name], MD  [name], MD    Principle Diagnosis: This is a 51 year old gentleman with stage IV renal cell carcinoma diagnosed in 2009.  Prior Therapy: 1. Status post laparoscopic radical nephrectomy.  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  2. Patient status post thoracotomy for a synchronous metastatic lung lesions done October 2009.  He had a lower lobe nodule, biopsy proven to be metastatic renal cell carcinoma.   3. Patient is status post stereotactic radiotherapy to pulmonary nodules in May of 2010. 4. He is S/P Sutent 50 mg 4 weeks on 2 weeks off from 10/2010 to 03/2013. He progressed at that time.  5. He is S/P radiation to the right sacral bone between 4/22 to 4/30.  6. He is S/P XRT to the left shoulder 03/20/14 to 03/31/14. 7. Votrient 800 mg by mouth daily from 03/2013 through 06/22/2015. Discontinued secondary to disease progression. 8. Nivolumab 3 mg/kg given every 2 weeks started on 06/29/2015. He is status post 4 cycles completed 08/10/2015. He developed disease progression in September 2016.  9. Status post radiation therapy to the left mid fibula completed on 11/14/2015. He received a grade 1 fraction.    Current therapy:  Cabometyx 60 mg daily started in November 2016.   Interim History:  Keith [name] presents today for a followup visit. Since his last visit, he reports no major changes in his health. He remains reasonably active and attends to activities of daily living. He has been experiencing left-sided shoulder pain which have not changed dramatically in the last few days. He denied any trauma or falls. He denied any neurological deficits. He is still able to use his arms without any difficulties.    He continues to take Cabometyx without any other complications. His diarrhea is  manageable with Imodium and Lomotil. He denied any hand-foot syndrome, fatigue or decline in his quality of life or performance status. He denied any recent hospitalization or illnesses. He continues to be on Lovenox and had not had any problems with thrombosis or bleeding.   He did not report any headaches blurred vision or double vision. Does not report any seizure activity or alteration of mental status. Does not report any abdominal pain or hematochezia. Does not report any hematuria but does report occasional hesitancy and nocturia. He does not report any rashes or lesions or petechiae. He does not report any lymphadenopathy.  Remainder of his review of system is unremarkable.  Medications: I have reviewed the patient's current medications.  Current Outpatient Prescriptions  Medication Sig Dispense Refill  . ALPRAZolam (XANAX) 1 MG tablet TAKE 1 TABLET THREE TIMES A DAY AS NEEDED FOR ANXIETY 90 tablet 0  . CABOMETYX 60 MG TABS TAKE 1 TABLET BY MOUTH 1 TIME DAILY WITH AT LEAST 8OZ OF WATER ON AN EMPTY STOMACH. DO NOT EAT FOR 2 HOURS BEFORE OR 1 HOUR AFTER. SWALLOW W 30 tablet 0  . calcium carbonate (TUMS - DOSED IN MG ELEMENTAL CALCIUM) 500 MG chewable tablet Chew 1 tablet by mouth as needed for indigestion or heartburn.    . dextromethorphan (ROBITUSSIN MAXIMUM STRENGTH) 15 MG/5ML syrup Take 10 mLs (30 mg total) by mouth 4 (four) times daily as needed for cough. 120 mL 0  . diphenhydrAMINE (BENADRYL) 25 mg capsule Take 25 mg by mouth every 6 (  six) hours as needed for itching.    . diphenoxylate-atropine (LOMOTIL) 2.5-0.025 MG tablet Take 1 tablet by mouth 4 (four) times daily as needed for diarrhea or loose stools. 30 tablet 0  . DULoxetine (CYMBALTA) 60 MG capsule TAKE 1 CAPSULE (60 MG TOTAL) BY MOUTH 2 (TWO) TIMES DAILY. 180 capsule 0  . enoxaparin (LOVENOX) 150 MG/ML injection INJECT 0.93 MLS (140 MG TOTAL) INTO THE SKIN DAILY. 30 Syringe 1  . feeding supplement, ENSURE ENLIVE, (ENSURE  ENLIVE) LIQD Take 237 mLs by mouth 2 (two) times daily between meals. 237 mL 12  . gabapentin (NEURONTIN) 300 MG capsule TAKE 1 CAPSULE THREE TIMES A DAY 270 capsule 0  . HYDROmorphone (DILAUDID) 8 MG tablet Take 1 tablet (8 mg total) by mouth every 4 (four) hours as needed for moderate pain or severe pain. 50 tablet 0  . ibuprofen (ADVIL,MOTRIN) 200 MG tablet Take 400 mg by mouth every 6 (six) hours as needed for fever, headache, moderate pain or cramping.    . insulin glargine (LANTUS) 100 UNIT/ML injection Inject 26 Units into the skin at bedtime.    [name] lactulose (CHRONULAC) 10 GM/15ML solution Take 30 mLs (20 g total) by mouth 2 (two) times daily. 240 mL 0  . metoprolol tartrate (LOPRESSOR) 25 MG tablet Take 1 tablet (25 mg total) by mouth 2 (two) times daily. 60 tablet 0  . quiNINE (QUALAQUIN) 324 MG capsule Take 2 capsules (648 mg total) by mouth 3 (three) times daily. 30 capsule 0  . senna-docusate (SENOKOT-S) 8.6-50 MG tablet Take 1 tablet by mouth 2 (two) times daily. 30 tablet 0  . tiZANidine (ZANAFLEX) 4 MG tablet Take 4 mg by mouth every 8 (eight) hours as needed for muscle spasms.   0  . VIAGRA 100 MG tablet Take 50 mg by mouth as needed for erectile dysfunction.      No current facility-administered medications for this visit.      Allergies:  Allergies  Allergen Reactions  . Ceftriaxone Hives  . Hydrocodone Swelling    Past Medical History, Surgical history, Social history, and Family History were reviewed and updated.   Physical Exam: Blood pressure 134/89, pulse 74, temperature 97.9 F (36.6 C), temperature source Oral, resp. rate 18, height '6\' 3"'$  (1.905 m), weight 189 lb 6.4 oz (85.9 kg), SpO2 99 %. ECOG: 1 General appearance: Alert, awake gentleman without distress. Head: Normocephalic, without obvious abnormality. No oral ulcers or lesions. Neck: no adenopathy, no masses.  Lymph nodes: Cervical, supraclavicular, and axillary nodes normal. Heart:regular rate and  rhythm, S1, S2.  Lung: Clear throughout auscultation. No wheezes noted. Abdomen: Soft, nontender without rebound or guarding. No rebound or guarding. EXT:  No masses or lesions palpated no left lower extremity. Neuro: No focal deficits noted.   Lab Results: Lab Results  Component Value Date   WBC 6.7 03/10/2017   HGB 14.1 03/10/2017   HCT 43.7 03/10/2017   MCV 105.6 (H) 03/10/2017   PLT 162 03/10/2017   EXAM: CT CHEST, ABDOMEN, AND PELVIS WITH CONTRAST  TECHNIQUE: Multidetector CT imaging of the chest, abdomen and pelvis was performed following the standard protocol during bolus administration of intravenous contrast.  CONTRAST:  174m ISOVUE-300 IOPAMIDOL (ISOVUE-300) INJECTION 61%  COMPARISON:  AP CT on 08/31/2016 and chest CT on 06/17/2016  FINDINGS: CT CHEST FINDINGS  Cardiovascular: No acute findings. Stable 4.0 cm ascending thoracic aortic aneurysm.  Mediastinum/Lymph Nodes: No masses or pathologically enlarged lymph nodes identified.  Lungs/Pleura: Mild emphysema. Stable 14  mm ill-defined nodular opacity central right upper lobe, with bandlike density extending to the posterior pleural surface. Stable 2.2 cm nodular opacity in the central right middle lobe. Left lower lobe pulmonary nodular opacities are also stable, largest measuring 1.3 x 2.6 cm. No new or enlarging pulmonary nodules or masses are identified. Mild left pleural thickening and bilateral lower lung scarring again noted.  Musculoskeletal:  No suspicious bone lesions identified.  CT ABDOMEN AND PELVIS FINDINGS  Hepatobiliary: 10 mm hypervascular subcapsular lesion in the posterior right hepatic lobe on image 60/2 remains stable. No other liver lesions identified. Gallbladder is unremarkable.  Pancreas: Atrophy and ductal dilatation in the pancreatic tail again seen. A hypervascular mass in pancreatic body measures 1.3 x 1.8 cm on image 63/2 compared to 0.9 x 1.4 cm previously.  Heterogeneously enhancing mass involving the pancreatic neck measures 2.5 x 2.8 cm on image 68/ 2 compared to 2.4 x 2.9 cm previously.  Spleen:  Within normal limits in size and appearance.  Adrenals/Urinary tract: Normal appearance of both adrenal glands. Previous left nephrectomy. Normal appearance of right kidney. Unopacified urinary bladder is unremarkable in appearance.  Stomach/Bowel: No evidence of obstruction, inflammatory process, or abnormal fluid collections.  Vascular/Lymphatic: No pathologically enlarged lymph nodes identified. No abdominal aortic aneurysm. Aortic atherosclerosis.  Reproductive:  No mass or other significant abnormality identified.  Other:  None.  Musculoskeletal: Lytic bone lesions involving the right sacrum, right inferior pubic ramus, right hip, and right posterior fourth rib are stable. A lytic bone lesion is also seen in the proximal left humerus which was not included in the field of view on previous exams. No definite new or enlarging bone lesions identified.  IMPRESSION: Stable bilateral pulmonary nodular opacities and mild left pleural thickening.  Mildly increased size of 1.8cm hypervascular metastasis in the pancreatic body. Stable metastasis involving the pancreatic neck.  Stable lytic bone metastases.  Stable hypervascular lesion in posterior right hepatic lobe.  Stable 4.0 cm ascending thoracic aortic aneurysm. Recommend annual imaging followup by CTA or MRA. This recommendation follows 2010 ACCF/AHA/AATS/ACR/ASA/SCA/SCAI/SIR/STS/SVM Guidelines for the Diagnosis and Management of Patients with Thoracic Aortic Disease. Circulation. 2010; 121: L491-P915     Impression and Plan:  This is a pleasant 51 year old gentleman with the following issues. 1. Metastatic renal cell carcinoma.  He has documented disease to the lung and the pancreas. He is status post multiple therapies. He is currently on Cabometyx and have  tolerated it well. CT scan on 03/10/2017 was personally reviewed today and did not show any major changes. There are no new lesions noted. Risks and benefits of continuing this medication was reviewed today and is agreeable to continue. I offered him dose reduction for better tolerance and he declined. He feels that he can tolerate this medication at the current dose. 2. Depression/Anxiety: He remains on Xanax and Cymbalta. His mood appears stable and appropriate. 3. Weight loss: Appeared to be stable at this time. 4. Left lower extremity pain and a mass: Pain is improved after a nerve block completed in January 2018. 5. Hypertension: Controlled without antihypertensive medication. 6. Pulmonary embolism: He is currently on Lovenox without any recent complications of bleeding or thrombosis. I recommended continuing Lovenox indefinitely for the time being. 7. Prognosis: He does have an incurable malignancy however he responded well to treatment that the plan is to continue with this palliative approach. His performance status remains adequate. 8. Diarrhea: Continues to be manageable. Continue aggressive treatment with Lomotil and Imodium. Dose reduction will be  needed if this becomes a problem. 9. Follow up: in 4 weeks.   [facility], MD 03/12/17

## 2017-03-13 DIAGNOSIS — C7800 Secondary malignant neoplasm of unspecified lung: Secondary | ICD-10-CM | POA: Diagnosis not present

## 2017-03-25 ENCOUNTER — Other Ambulatory Visit: Payer: Self-pay | Admitting: *Deleted

## 2017-03-25 MED ORDER — DULOXETINE HCL 60 MG PO CPEP: 60.0000 mg | ORAL_CAPSULE | Freq: Two times a day (BID) | ORAL | 0 refills | Status: DC

## 2017-03-27 ENCOUNTER — Telehealth: Payer: Self-pay | Admitting: *Deleted

## 2017-03-27 NOTE — Telephone Encounter (Signed)
Received call from pt stating that he needed refills on his xanax & gabapentin. Pt left ph # [phone].  Pt had refill on xanax 03/04/17 & should not be out yet based on directions. Gabapentin should be due refill on 04/12/17.  Tried calling pt back at this # & unable to leave message to call back.  Tried other #'s listed & reached mother who said try [phone] & line busy.   Finally reached pt at [phone] & he states [name] said he could take extra of both xanax & gabapentin.  He states he has @ 7 left of xanax & out of gabapentin.  Message to Dr Corliss Parish RN

## 2017-03-31 ENCOUNTER — Encounter: Payer: Self-pay | Admitting: *Deleted

## 2017-03-31 ENCOUNTER — Other Ambulatory Visit: Payer: Self-pay | Admitting: *Deleted

## 2017-03-31 DIAGNOSIS — C649 Malignant neoplasm of unspecified kidney, except renal pelvis: Secondary | ICD-10-CM

## 2017-03-31 DIAGNOSIS — K8689 Other specified diseases of pancreas: Secondary | ICD-10-CM

## 2017-03-31 DIAGNOSIS — C7951 Secondary malignant neoplasm of bone: Secondary | ICD-10-CM

## 2017-03-31 DIAGNOSIS — K611 Rectal abscess: Secondary | ICD-10-CM

## 2017-03-31 DIAGNOSIS — F419 Anxiety disorder, unspecified: Secondary | ICD-10-CM

## 2017-03-31 MED ORDER — ALPRAZOLAM 1 MG PO TABS: ORAL_TABLET | ORAL | 0 refills | Status: DC

## 2017-03-31 MED ORDER — GABAPENTIN 300 MG PO CAPS: 300.0000 mg | ORAL_CAPSULE | Freq: Three times a day (TID) | ORAL | 0 refills | Status: DC

## 2017-03-31 NOTE — Telephone Encounter (Signed)
Spoke with pateint, let him know that his xanax and gabapentin have been refilled.

## 2017-04-13 DIAGNOSIS — C7800 Secondary malignant neoplasm of unspecified lung: Secondary | ICD-10-CM | POA: Diagnosis not present

## 2017-04-14 ENCOUNTER — Other Ambulatory Visit: Payer: Self-pay | Admitting: Oncology

## 2017-04-14 DIAGNOSIS — C649 Malignant neoplasm of unspecified kidney, except renal pelvis: Secondary | ICD-10-CM

## 2017-04-21 ENCOUNTER — Ambulatory Visit: Payer: Medicare Other | Admitting: Oncology

## 2017-04-21 ENCOUNTER — Other Ambulatory Visit: Payer: Medicare Other

## 2017-04-21 ENCOUNTER — Telehealth: Payer: Self-pay | Admitting: Oncology

## 2017-04-21 ENCOUNTER — Telehealth: Payer: Self-pay | Admitting: *Deleted

## 2017-04-21 NOTE — Telephone Encounter (Signed)
Patient called to cancel all appointments today due to being sick. Per Dr. [name], reschedule appointments to next available. Patient aware that scheduling will call to arrange date and time.

## 2017-04-21 NOTE — Telephone Encounter (Signed)
sw pt re 5/31 appt at 0845 per LOS

## 2017-04-23 IMAGING — CT CT CHEST W/ CM
2 of 6 series · 11 of 32 positions shown, 17 images · IV contrast (OMNIPAQUE)
Comparison: CT scan 12/29/2014

CLINICAL DATA: History of left nephrectomy for renal cell carcinoma
diagnosed in 2228. History of metastatic disease to the lung in 0999
and to the pancreas and bone in 8454. The patient has had a right
lower lobe lobectomy.

EXAM:
CT CHEST, ABDOMEN, AND PELVIS WITH CONTRAST
TECHNIQUE: Multidetector CT imaging of the chest, abdomen and pelvis was
performed following the standard protocol during bolus
administration of intravenous contrast.
CONTRAST:  100mL OMNIPAQUE IOHEXOL 300 MG/ML  SOLN

[Series 4: renal nephrographic · axial · 0.96mm/px · z∈[+701,+1268]mm · 9 of 237 slices shown, 15 images]
[im 24/237  soft-tissue]
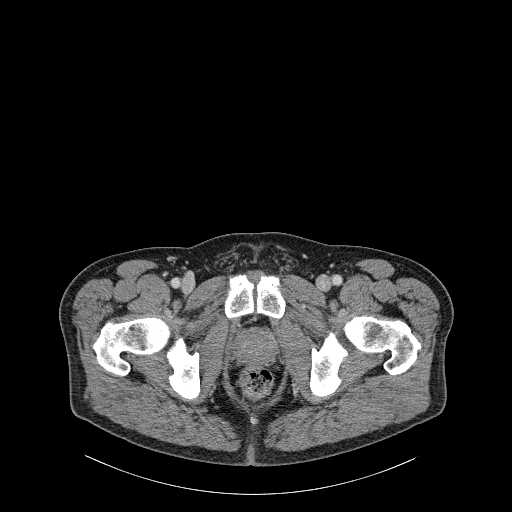
[im 24/237  bone]
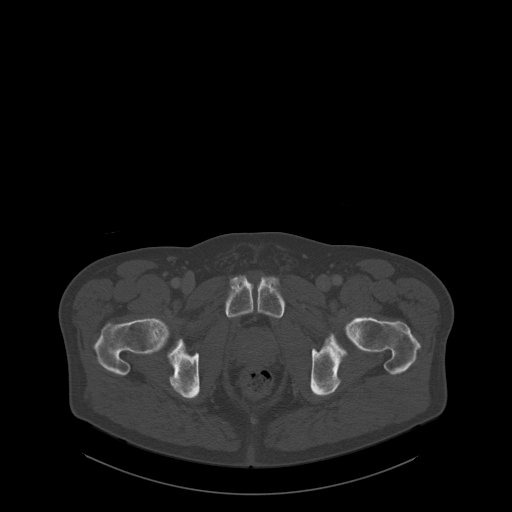
[im 48/237  soft-tissue]
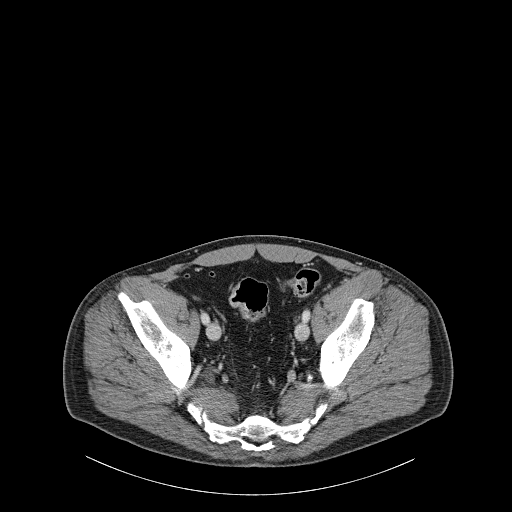
[im 71/237  soft-tissue]
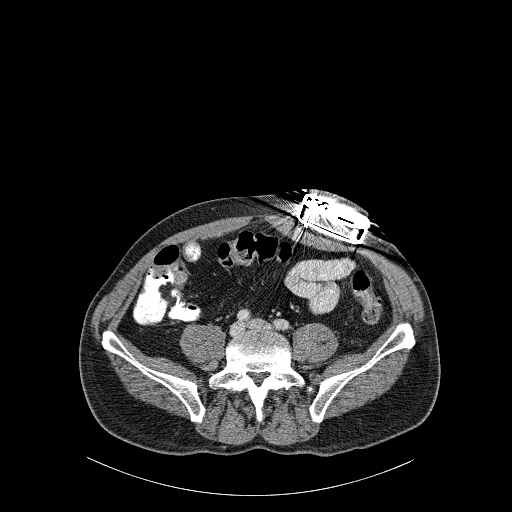
[im 95/237  soft-tissue]
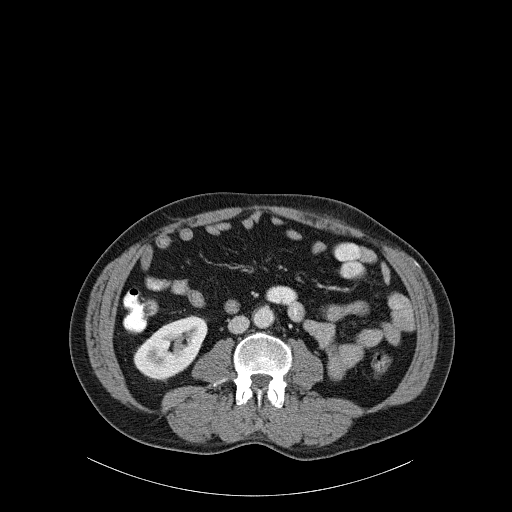
[im 119/237  soft-tissue]
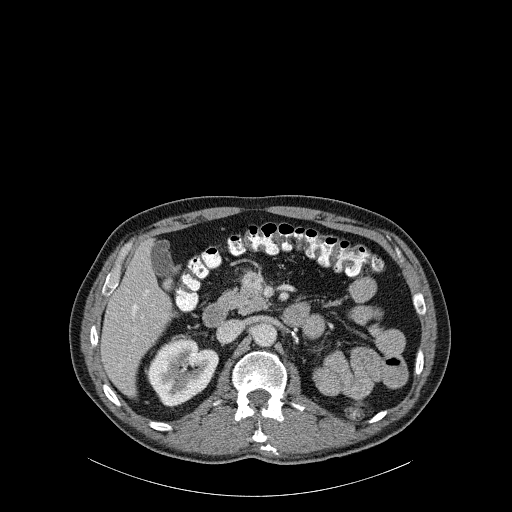
[im 142/237  soft-tissue]
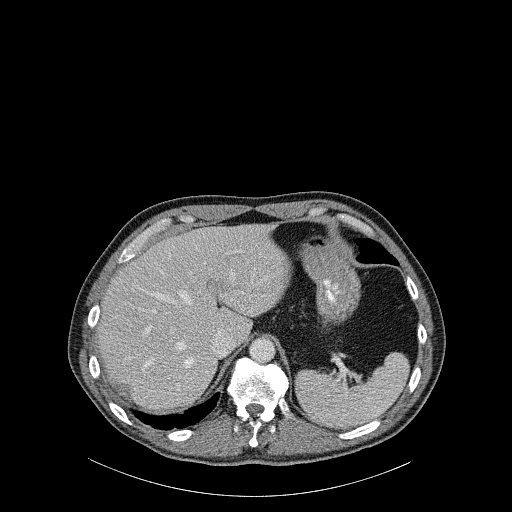
[im 142/237  lung]
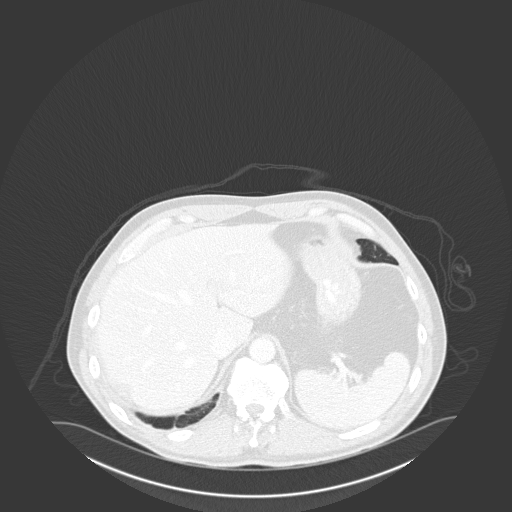
[im 166/237  soft-tissue]
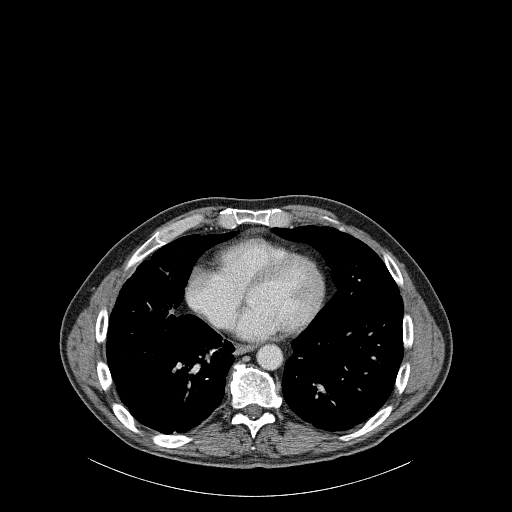
[im 166/237  lung]
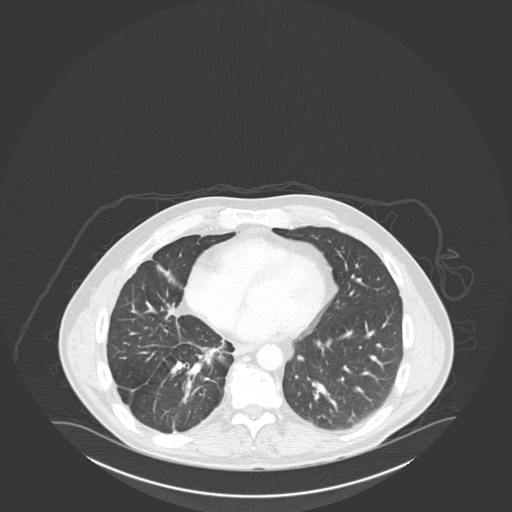
[im 189/237  soft-tissue]
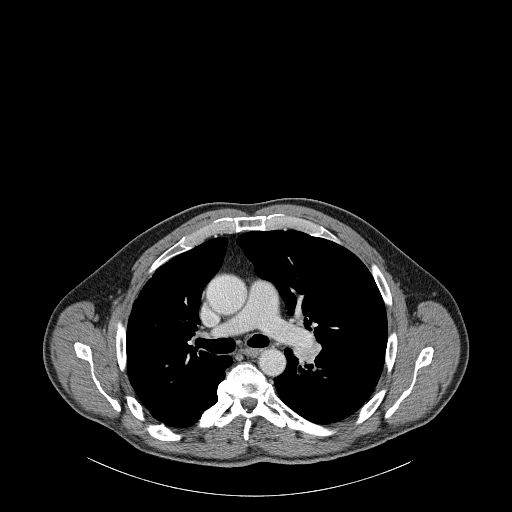
[im 189/237  lung]
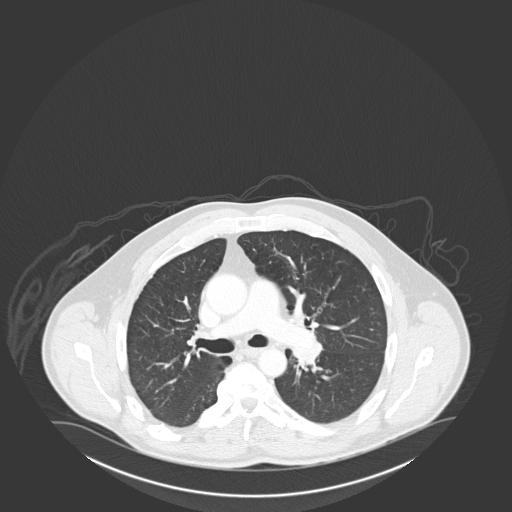
[im 213/237  soft-tissue]
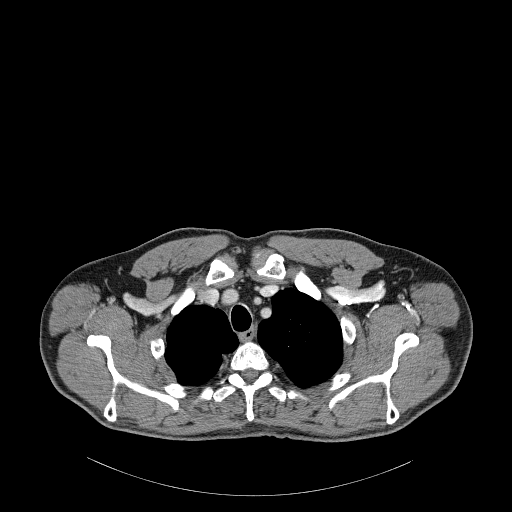
[im 213/237  lung]
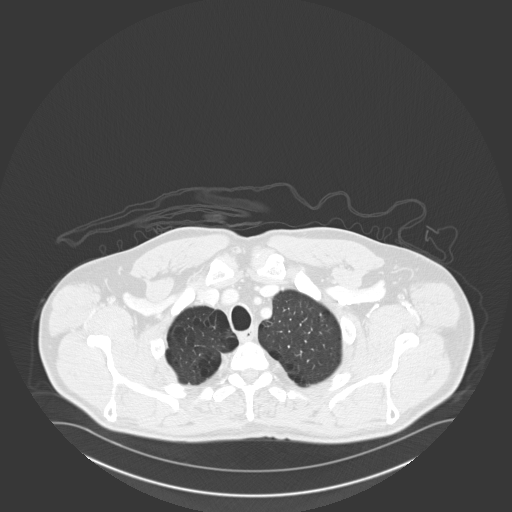
[im 213/237  bone]
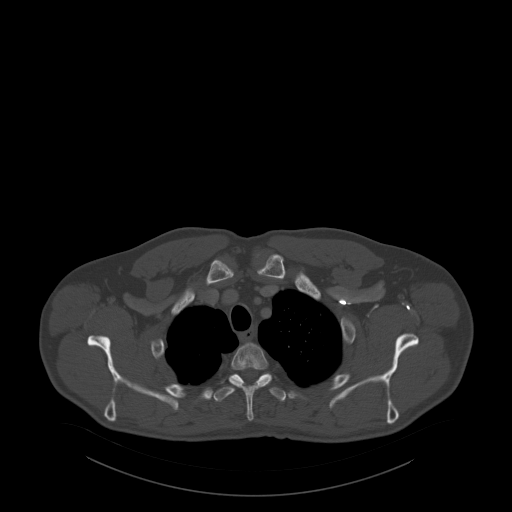

[Series 8: lung windows · axial · 0.96mm/px · z∈[+1103,+1181]mm · 2 of 106 slices shown]
[im 27/106  bone]
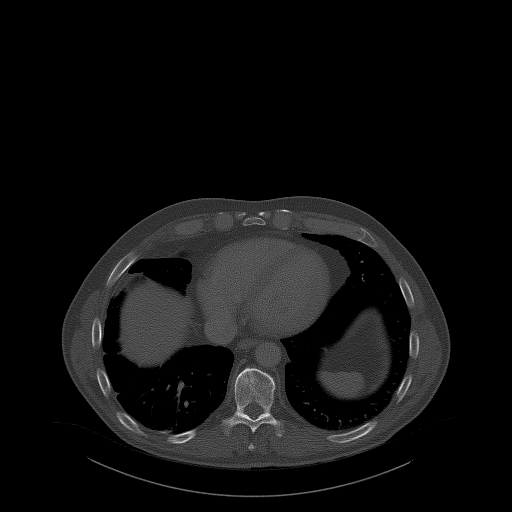
[im 53/106  bone]
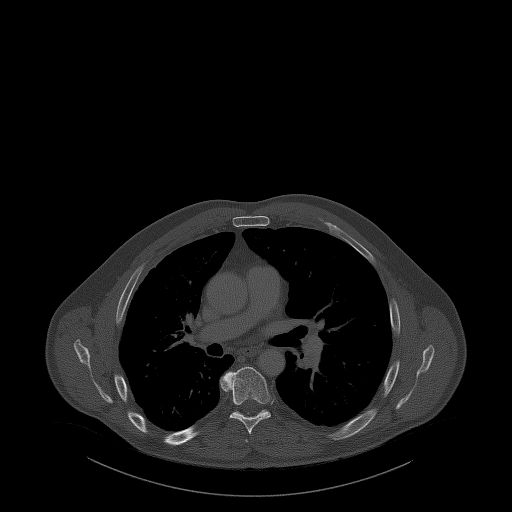

[11 of 32 positions shown; findings below may reference images not displayed]

FINDINGS: CT CHEST FINDINGS

Chest wall: No chest wall mass, supraclavicular or axillary
lymphadenopathy. The thyroid gland appears normal. The bony thorax
is intact. No destructive bone lesions or spinal canal compromise.
Evidence of remote right-sided thoracotomy with heel rib fractures.

Mediastinum: The heart is normal in size. No pericardial effusion.
The aorta is normal in caliber. No dissection. The branch vessels
are patent. No enlarged mediastinal lymph nodes. Slight progression
of left hilar lymphadenopathy when compared to the prior
examination. This measures 24.5 x 15 mm on image number 50 and
previously measured 20 x 9 mm. The esophagus is normal.

Lungs/ pleura: Stable advanced emphysematous changes. Stable dense
scarring changes in the right long likely a combination of surgery
and radiation. Slightly progressive nodular soft tissue density in
the right middle lobe surrounding the right middle lobe bronchi.
This measures 26 x 24.5 mm on image number 66 and previously
measured 22 x 14 mm.

There are few small, sub 3 mm subpleural pulmonary nodules which are
stable.

No new pulmonary nodules.  No pleural effusion.

CT ABDOMEN AND PELVIS FINDINGS

Hepatobiliary: No focal hepatic lesions to suggest hepatic
metastatic disease. No intrahepatic biliary dilatation. The portal
and hepatic veins are patent. The gallbladder is normal. No common
bile duct dilatation.

Pancreas: Slight interval enlargement of pancreatic lesions. The
enhancing lesion in the pancreatic head measures 26 x 24 mm on image
number 117 and previously measured 23 x 16.5 mm. The pancreatic body
lesion is also slightly larger measuring approximately 38 x 19 mm
and previously measuring 27 x 19 mm. Stable areas of significant
segmental dilatation of the main pancreatic duct. Moderate atrophy
of the pancreatic tail.

Spleen: Normal size.  No focal lesions.

Adrenals/Urinary Tract: The adrenal glands are normal.

The left kidney is surgically absent.  The right kidney is normal.

Stomach/Bowel: The stomach, duodenum, small bowel and colon are
unremarkable. No inflammatory changes, mass lesions or obstructive
findings.

Vascular/Lymphatic: The aorta and branch vessels are patent. The
major venous structures are patent. Small scattered mesenteric and
retroperitoneal lymph nodes but no mass or adenopathy.

Other: The bladder, prostate gland and seminal vesicles are
unremarkable. No pelvic mass or adenopathy. No free pelvic fluid
collections. No inguinal mass or adenopathy.

Musculoskeletal: Enlarging right sacral lytic lesion which measures
35.5 x 36.5 mm on image number 182. It previously measured 30 x 23
mm.

No new bone lesions.
IMPRESSION: 1. Interval slight progression of disease in the chest as described
above. Enlarging left hilar adenopathy and right middle lobe
peribronchial lesion.
2. Stable surgical and radiation changes in the chest. No new
pulmonary nodules and no mediastinal adenopathy.
3. Stable emphysematous changes and pulmonary scarring.
4. Slight interval enlargement of the pancreatic head and body
masses. Persistent pancreatic ductal dilatation and pancreatic tail
atrophy.
5. Status post left nephrectomy. No mass in the left nephrectomy bed
and no retroperitoneal adenopathy.
6. Enlarging right sacral lytic lesion. No new bone lesions are
identified.

## 2017-05-01 ENCOUNTER — Other Ambulatory Visit: Payer: Self-pay | Admitting: Oncology

## 2017-05-06 DIAGNOSIS — M25551 Pain in right hip: Secondary | ICD-10-CM | POA: Diagnosis not present

## 2017-05-06 DIAGNOSIS — Z9689 Presence of other specified functional implants: Secondary | ICD-10-CM | POA: Diagnosis not present

## 2017-05-06 DIAGNOSIS — M5417 Radiculopathy, lumbosacral region: Secondary | ICD-10-CM | POA: Diagnosis not present

## 2017-05-06 DIAGNOSIS — R030 Elevated blood-pressure reading, without diagnosis of hypertension: Secondary | ICD-10-CM | POA: Diagnosis not present

## 2017-05-11 DIAGNOSIS — R030 Elevated blood-pressure reading, without diagnosis of hypertension: Secondary | ICD-10-CM | POA: Diagnosis not present

## 2017-05-11 DIAGNOSIS — Z9689 Presence of other specified functional implants: Secondary | ICD-10-CM | POA: Diagnosis not present

## 2017-05-11 DIAGNOSIS — M5417 Radiculopathy, lumbosacral region: Secondary | ICD-10-CM | POA: Diagnosis not present

## 2017-05-11 DIAGNOSIS — M25551 Pain in right hip: Secondary | ICD-10-CM | POA: Diagnosis not present

## 2017-05-13 ENCOUNTER — Other Ambulatory Visit: Payer: Self-pay | Admitting: *Deleted

## 2017-05-13 ENCOUNTER — Telehealth: Payer: Self-pay

## 2017-05-13 ENCOUNTER — Encounter: Payer: Self-pay | Admitting: *Deleted

## 2017-05-13 DIAGNOSIS — F419 Anxiety disorder, unspecified: Secondary | ICD-10-CM

## 2017-05-13 DIAGNOSIS — C649 Malignant neoplasm of unspecified kidney, except renal pelvis: Secondary | ICD-10-CM

## 2017-05-13 DIAGNOSIS — K611 Rectal abscess: Secondary | ICD-10-CM

## 2017-05-13 DIAGNOSIS — K8689 Other specified diseases of pancreas: Secondary | ICD-10-CM

## 2017-05-13 DIAGNOSIS — C7800 Secondary malignant neoplasm of unspecified lung: Secondary | ICD-10-CM | POA: Diagnosis not present

## 2017-05-13 DIAGNOSIS — C7951 Secondary malignant neoplasm of bone: Secondary | ICD-10-CM

## 2017-05-13 MED ORDER — ALPRAZOLAM 1 MG PO TABS: ORAL_TABLET | ORAL | 0 refills | Status: DC

## 2017-05-13 MED ORDER — DIPHENOXYLATE-ATROPINE 2.5-0.025 MG PO TABS: 1.0000 | ORAL_TABLET | Freq: Four times a day (QID) | ORAL | 0 refills | Status: DC | PRN

## 2017-05-13 NOTE — Telephone Encounter (Signed)
Pharmacy called to make sure per insurance requirement that MD is aware that pt is on both xanax and hydromorphone and is OK with this. Last hydromorphone was fill by Dr Shearon Stalls on 5/18.  Lattie Haw at [place] [facility]

## 2017-05-13 NOTE — Telephone Encounter (Signed)
Pt is requesting lomotil and xanax refill to [facility]. Both called in.

## 2017-05-19 ENCOUNTER — Other Ambulatory Visit: Payer: Self-pay | Admitting: *Deleted

## 2017-05-19 DIAGNOSIS — C649 Malignant neoplasm of unspecified kidney, except renal pelvis: Secondary | ICD-10-CM

## 2017-05-19 MED ORDER — CABOZANTINIB S-MALATE 60 MG PO TABS: 1.0000 | ORAL_TABLET | Freq: Every day | ORAL | 0 refills | Status: DC

## 2017-05-21 ENCOUNTER — Other Ambulatory Visit: Payer: Medicare Other

## 2017-05-21 ENCOUNTER — Ambulatory Visit: Payer: Medicare Other | Admitting: Oncology

## 2017-05-29 IMAGING — MR MR PELVIS WO/W CM
7 of 9 series · 35 of 48 positions shown · IV contrast (multihance)
Comparison: Radiographs 06/19/2015, CT 12/29/2014, bone scan
04/05/2013 and MRI 03/09/2013.

CLINICAL DATA: Metastatic renal cell carcinoma for follow up.
Currently undergoing chemotherapy. Subsequent encounter)

EXAM:
MRI PELVIS WITHOUT AND WITH CONTRAST
TECHNIQUE: Multiplanar multisequence MR imaging of the pelvis was performed
both before and after administration of intravenous contrast.
CONTRAST:  19 ml MultiHance.

[Series 3: T1 · coronal · 5.0mm · 1.41mm/px · 5 of 22 slices shown (1 of 2)]
[im 1/22]
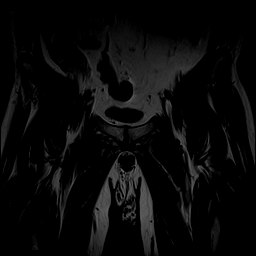
[im 6/22]
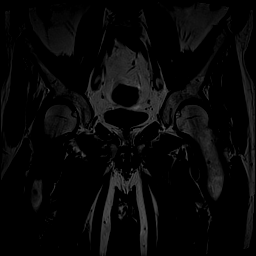
[im 11/22]
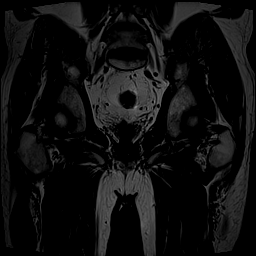
[im 16/22]
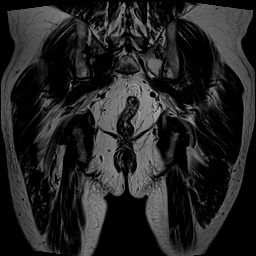
[im 22/22]
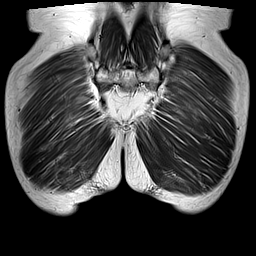

[Series 4: STIR · coronal · 5.0mm · 1.41mm/px · 5 of 22 slices shown]
[im 1/22]
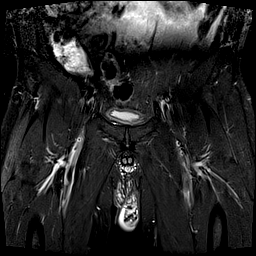
[im 6/22]
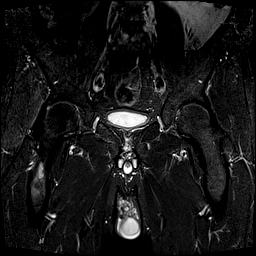
[im 11/22]
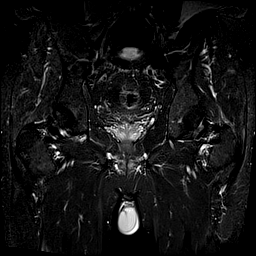
[im 16/22]
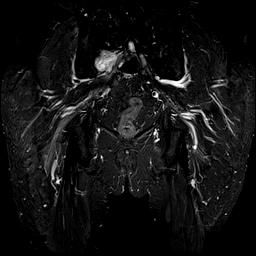
[im 22/22]
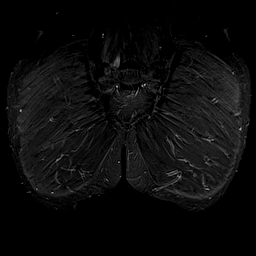

[Series 5: T1 · axial · 6.0mm · 0.74mm/px · z∈[-79,+164]mm · 5 of 30 slices shown (2 of 2)]
[im 1/30]
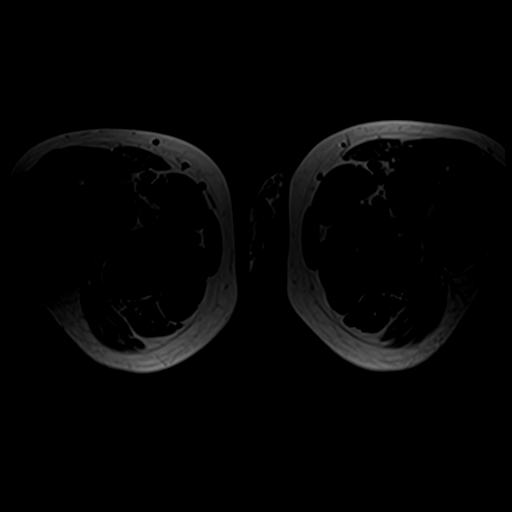
[im 8/30]
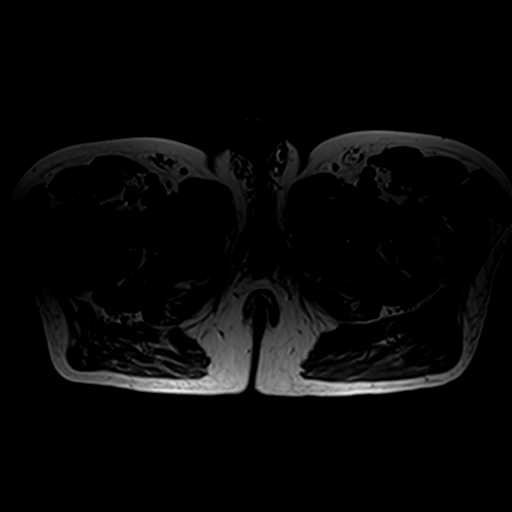
[im 15/30]
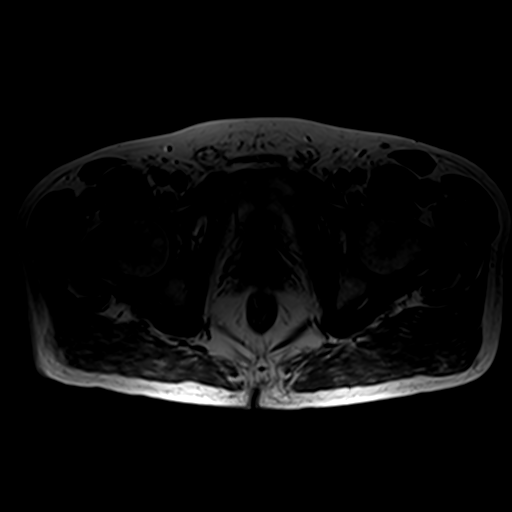
[im 22/30]
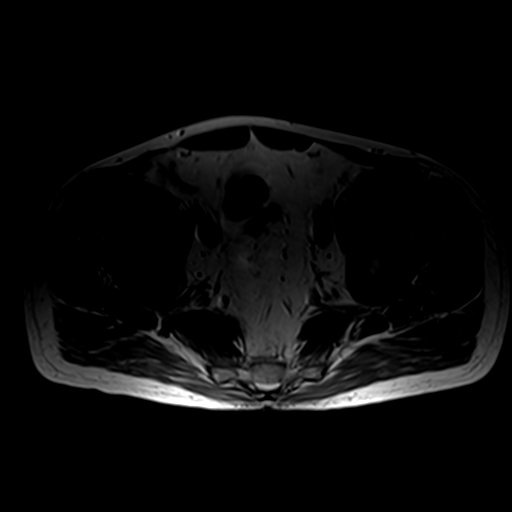
[im 30/30]
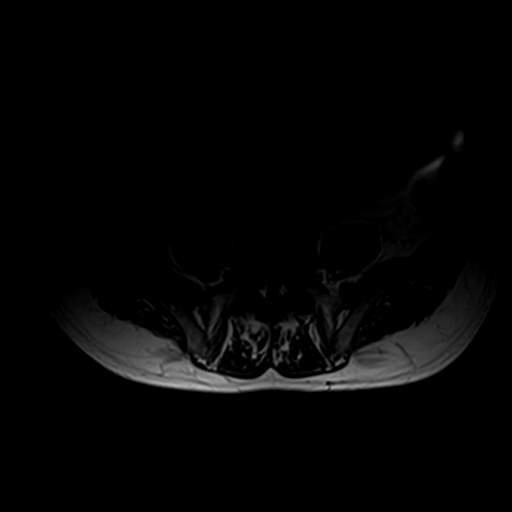

[Series 6: T2 fat-sat · axial · 6.0mm · 0.74mm/px · z∈[-62,+182]mm · 5 of 30 slices shown (1 of 2)]
[im 1/30]
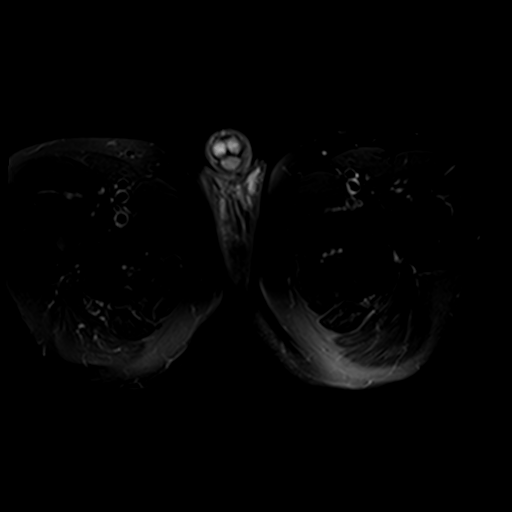
[im 8/30]
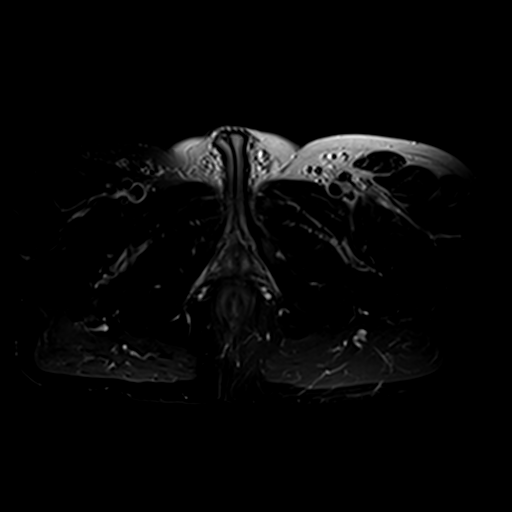
[im 15/30]
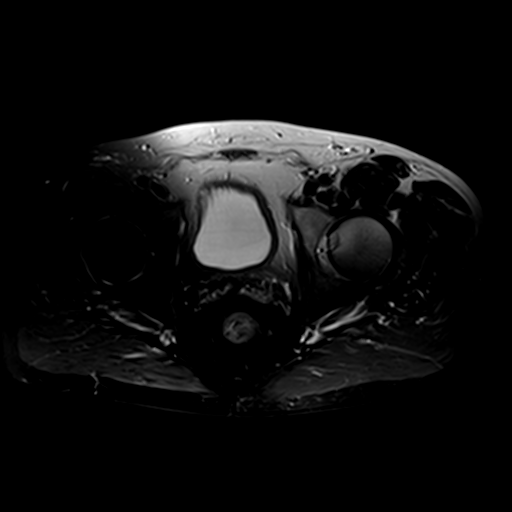
[im 22/30]
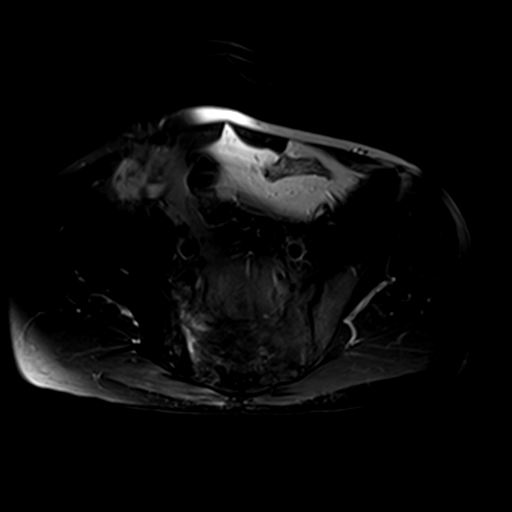
[im 30/30]
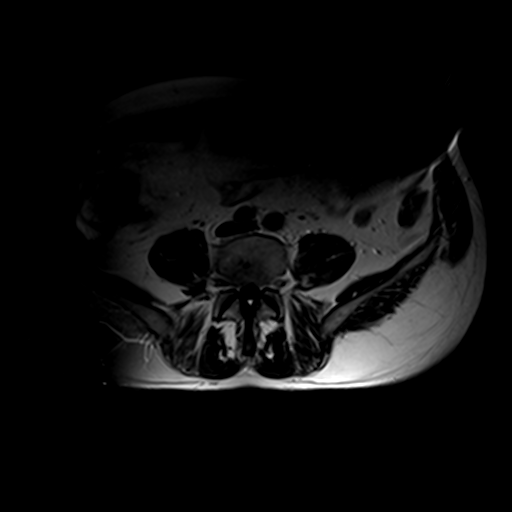

[Series 7: T2 fat-sat · sagittal · 6.0mm · 1.17mm/px · 7 of 40 slices shown (2 of 2)]
[im 1/40]
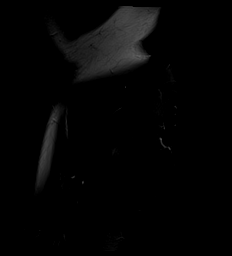
[im 7/40]
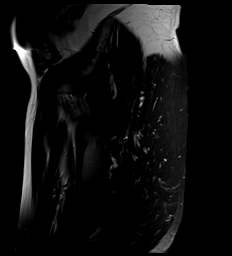
[im 14/40]
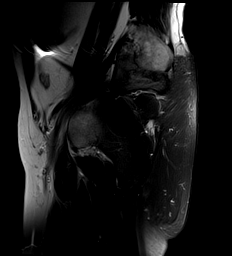
[im 20/40]
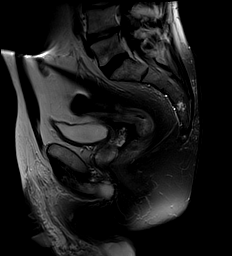
[im 27/40]
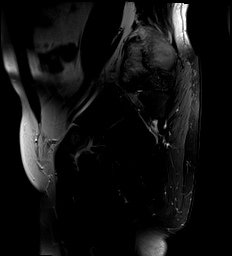
[im 33/40]
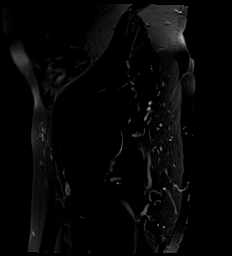
[im 40/40]
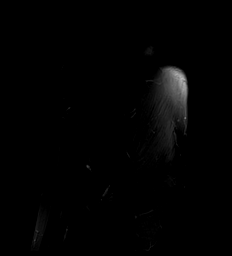

[Series 8: T1 fat-sat · axial · 6.0mm · 0.74mm/px · z∈[-79,+164]mm · 5 of 30 slices shown]
[im 1/30]
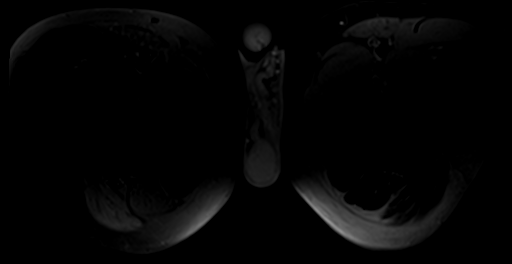
[im 8/30]
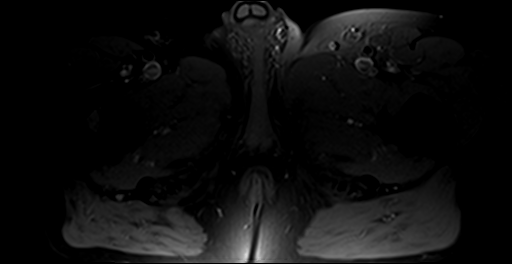
[im 15/30]
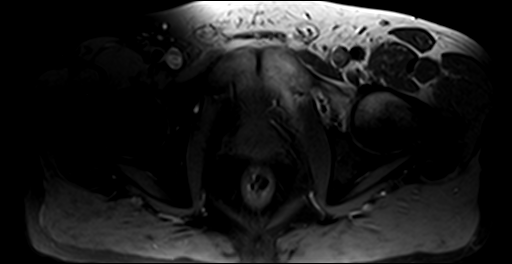
[im 22/30]
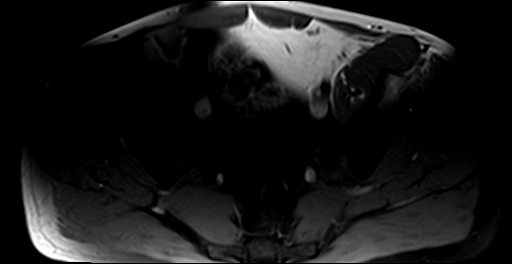
[im 30/30]
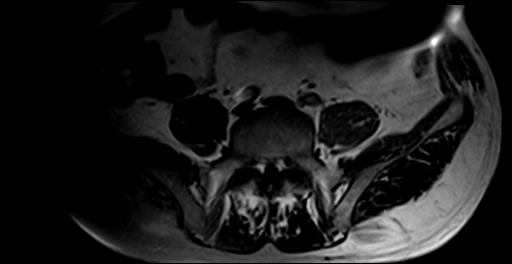

[Series 19: T1 fat-sat post-contrast · coronal · 5.0mm · 1.41mm/px · 3 of 26 slices shown]
[im 1/26]
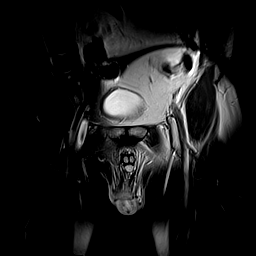
[im 7/26]
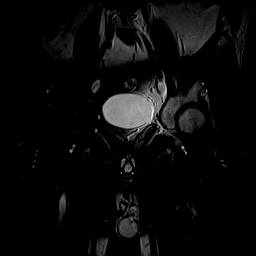
[im 13/26]
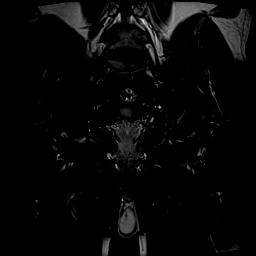

[35 of 48 positions shown; findings below may reference images not displayed]

FINDINGS: Bones: Both femoral heads appear normal without evidence of acute
fracture, dislocation or avascular necrosis. There is a new
enhancing 1.8 cm endosteal lesion anteriorly in the
intertrochanteric region of the proximal right femur consistent with
a metastasis. The known right sacral metastasis has enlarged
compared with the previous MRI. This currently measures 3.2 x 4.2 x
4.0 cm and demonstrates heterogeneous enhancement following
contrast. The right femoral lesion is associated with endosteal
thinning. There is no evidence of pathologic fracture. No other
osseous metastases identified. The sacroiliac joints and symphysis
pubis appear normal.

Articular cartilage and labrum

Articular cartilage: No focal chondral defect or subchondral signal
abnormality identified.

Labrum: Mild labral degeneration, greater on the right. No gross
labral tear.

Joint or bursal effusion

Joint effusion: No significant hip joint effusion.

Bursae: No focal periarticular fluid collection.

Muscles and tendons

Muscles and tendons: The gluteus, hamstring and iliopsoas tendons
appear normal. The piriformis muscles appear symmetric.

Other findings

Miscellaneous: The visualized internal pelvic contents appear
unremarkable.
IMPRESSION: 1. Enlarging right sacral osseous metastasis compared with previous
MRI from [DATE]. New metastasis in the anterior intertrochanteric region of the
right femur.
3. No evidence of pathologic fracture or extraosseous metastatic
disease.

## 2017-05-29 IMAGING — MR MR LUMBAR SPINE WO/W CM
8 series · 48 of 48 positions shown · IV contrast (multihance)
Comparison: Pelvis MRI from today reported separately. Total spine
MRI 12/25/2014. CT Abdomen and Pelvis 06/20/2015.

CLINICAL DATA: 49-year-old male with metastatic renal cell
carcinoma currently on chemotherapy. Lumbosacral radiculopathy.
Status post radiation treatment of right sacral oligometastatic
deposit to 50 gray in 5 fractions. Subsequent encounter.

EXAM:
MRI LUMBAR SPINE WITHOUT AND WITH CONTRAST
TECHNIQUE: Multiplanar and multiecho pulse sequences of the lumbar spine were
obtained without and with intravenous contrast.
CONTRAST:  19 mL MultiHance in conjunction with contrast enhanced
imaging of the pelvis reported separately.

[Series 4: tirm sag · sagittal · 4.0mm · 0.55mm/px · 3 of 13 slices shown]
[im 1/13]
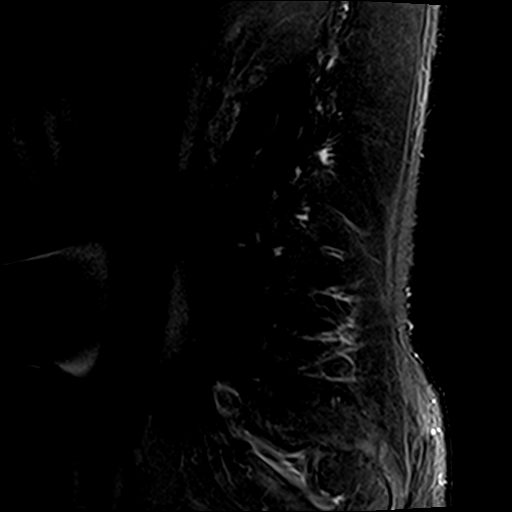
[im 7/13]
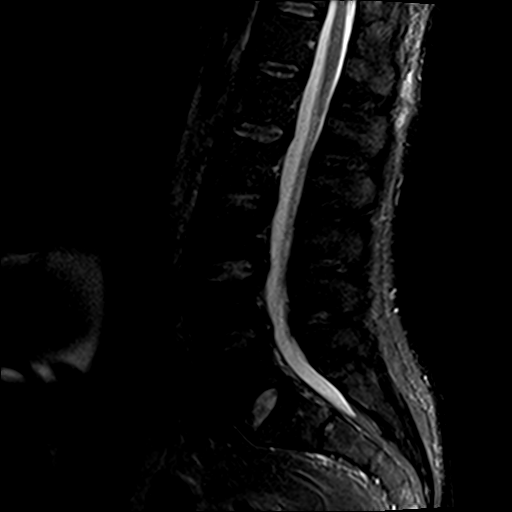
[im 13/13]
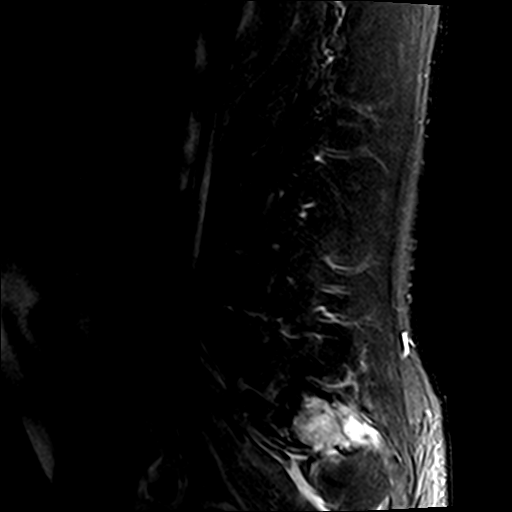

[Series 5: T2 · sagittal · 4.0mm · 0.88mm/px · 3 of 13 slices shown (1 of 2)]
[im 1/13]
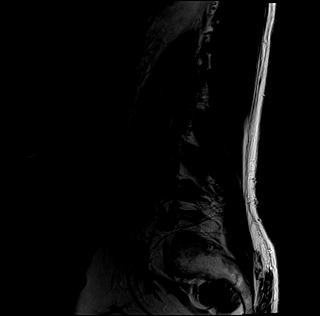
[im 7/13]
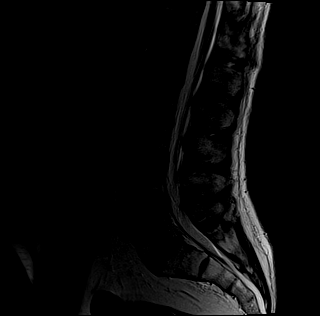
[im 13/13]
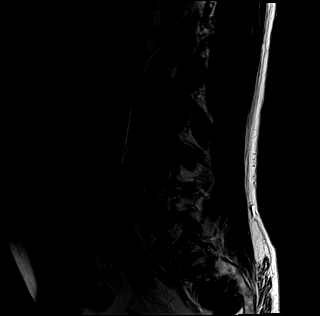

[Series 6: T1 · sagittal · 4.0mm · 0.88mm/px · 4 of 13 slices shown (1 of 2)]
[im 1/13]
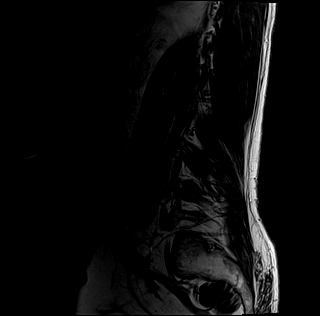
[im 5/13]
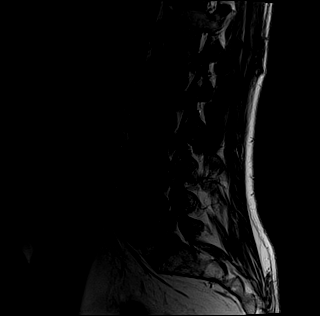
[im 9/13]
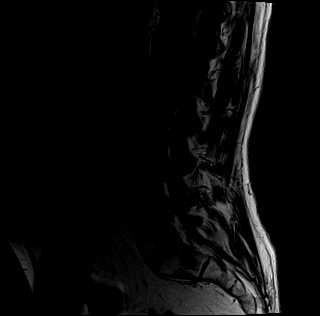
[im 13/13]
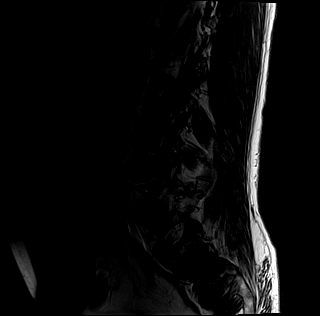

[Series 7: T1 · axial · 4.0mm · 0.70mm/px · z∈[+118,+306]mm · 10 of 33 slices shown (2 of 2)]
[im 1/33]
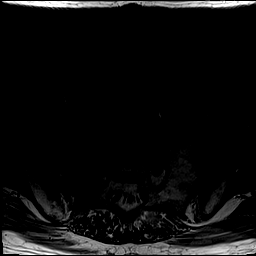
[im 4/33]
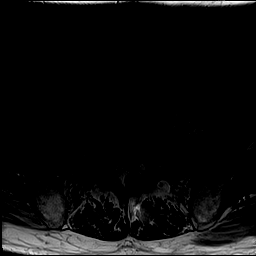
[im 8/33]
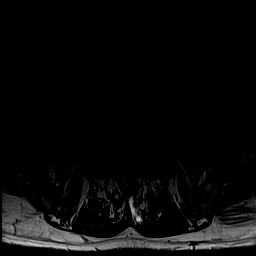
[im 11/33]
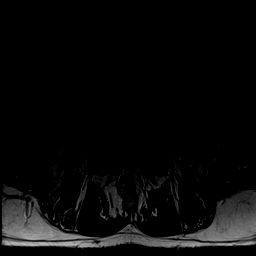
[im 15/33]
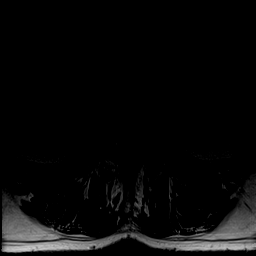
[im 18/33]
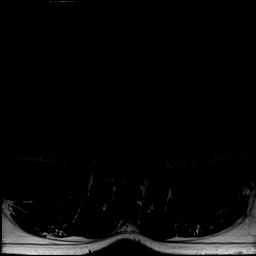
[im 22/33]
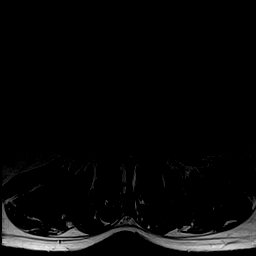
[im 25/33]
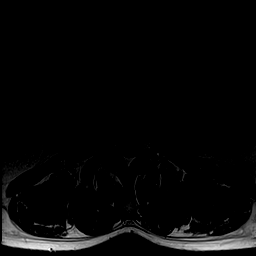
[im 29/33]
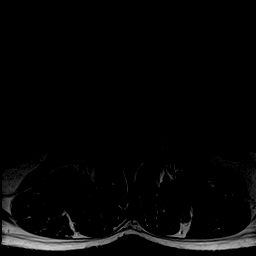
[im 33/33]
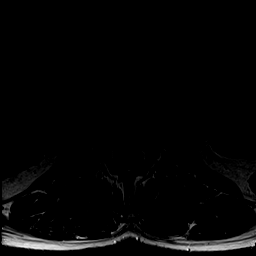

[Series 8: T2 · axial · 4.0mm · 0.70mm/px · z∈[+118,+306]mm · 10 of 33 slices shown (2 of 2)]
[im 1/33]
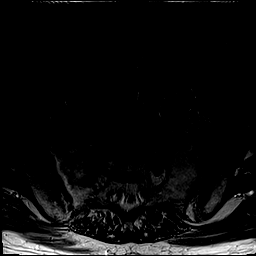
[im 4/33]
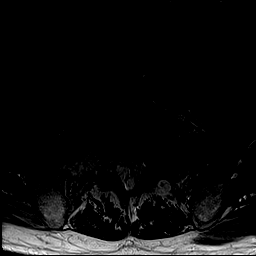
[im 8/33]
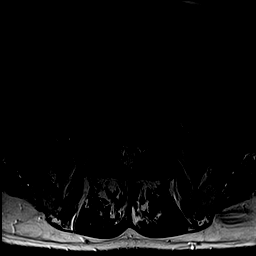
[im 11/33]
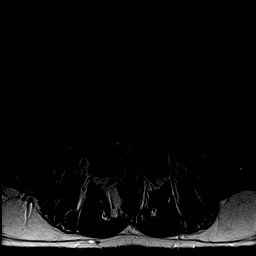
[im 15/33]
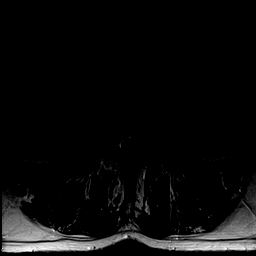
[im 18/33]
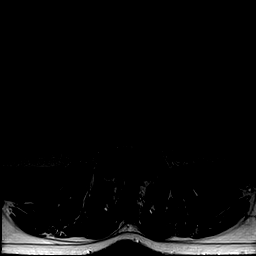
[im 22/33]
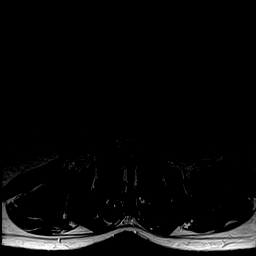
[im 25/33]
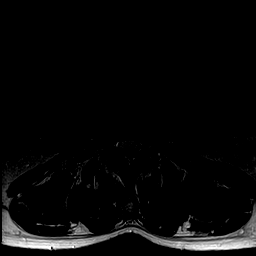
[im 29/33]
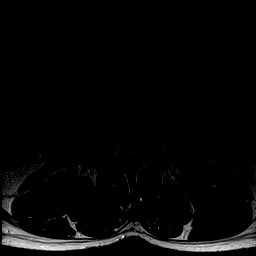
[im 33/33]
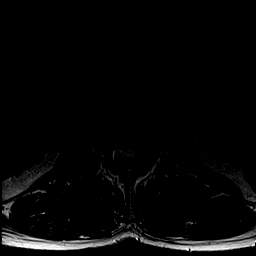

[Series 9: T1 fat-sat · sagittal · 4.0mm · 0.88mm/px · 4 of 13 slices shown]
[im 1/13]
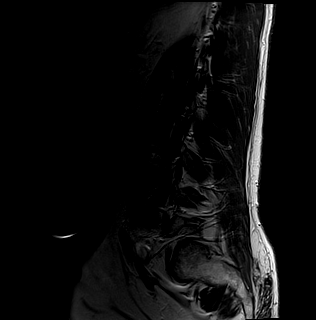
[im 5/13]
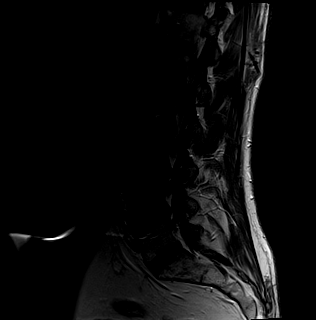
[im 9/13]
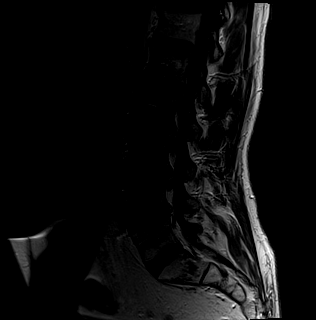
[im 13/13]
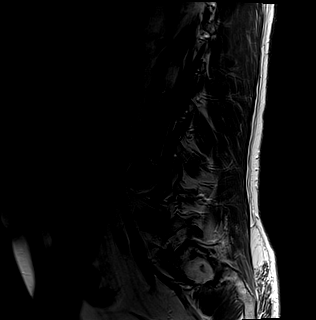

[Series 10: T1 fat-sat post-contrast · sagittal · 4.0mm · 0.88mm/px · 4 of 13 slices shown]
[im 1/13]
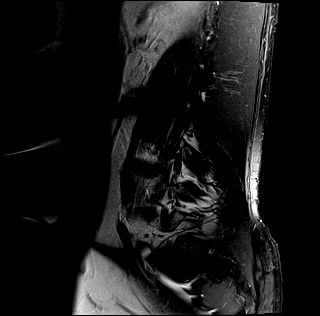
[im 5/13]
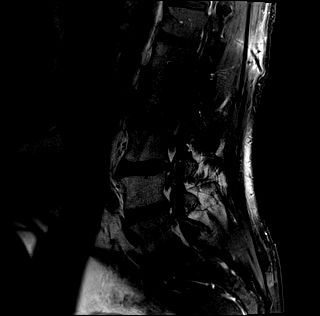
[im 9/13]
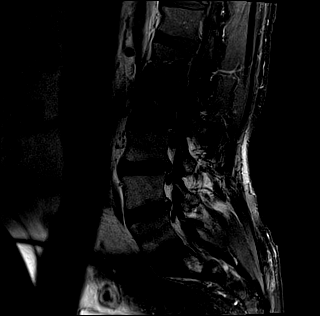
[im 13/13]
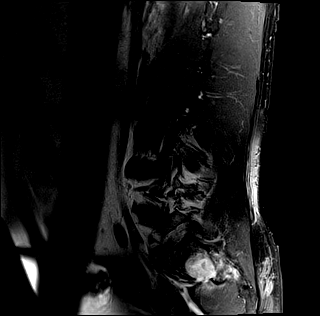

[Series 11: T1 post-contrast · axial · 4.0mm · 0.74mm/px · z∈[+118,+306]mm · 10 of 33 slices shown]
[im 1/33]
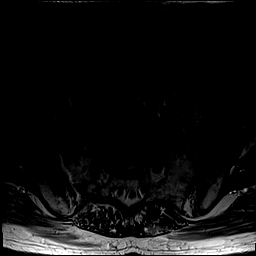
[im 4/33]
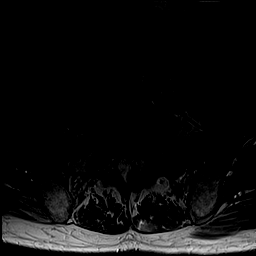
[im 8/33]
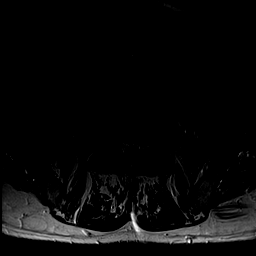
[im 11/33]
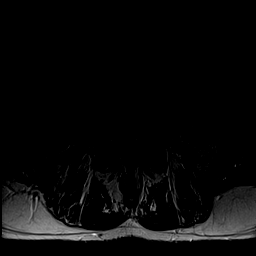
[im 15/33]
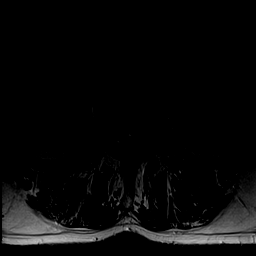
[im 18/33]
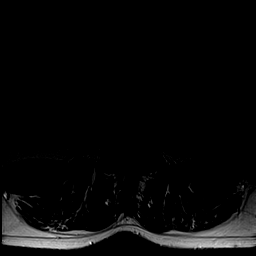
[im 22/33]
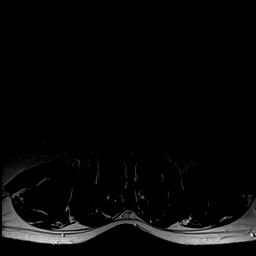
[im 25/33]
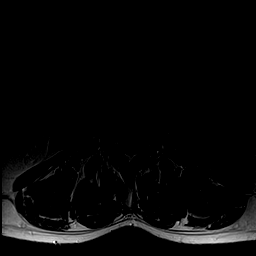
[im 29/33]
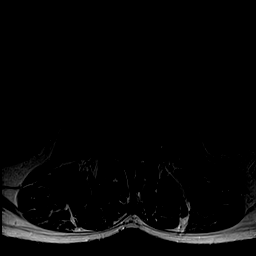
[im 33/33]
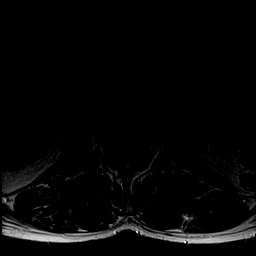

[48 of 48 positions shown; findings below may reference images not displayed]

FINDINGS: Transitional lumbosacral anatomy, same numbering system as on
12/25/2014.

Partially visible abnormal right sacral ala, and abnormal T1 signal
hair appears progressed since [DATE], image 33). The
right S2 neural foramen and exiting nerve appear affected (series 6,
image 3).

Bone marrow signal in the lumbar spine appears stable since [REDACTED].
No lumbar metastasis identified. Lumbar vertebral height and
alignment are stable.

Visualized lower thoracic spinal cord is normal with conus medularis
at L1-L2. Cauda equina nerve roots appear within normal limits. No
abnormal intradural enhancement identified.

No significant lumbar spinal stenosis. Chronic disc degeneration at
L5-S1.

Stable visualized abdominal viscera.
IMPRESSION: 1. Right sacral metastasis affecting the right S2 nerve level
appears progressed since 12/25/2014. See pelvis MRI from today
reported separately.
2. No lumbar metastatic disease identified. Note transitional
anatomy, same numbering system utilized as on 12/25/2014.

## 2017-06-18 ENCOUNTER — Other Ambulatory Visit: Payer: Self-pay | Admitting: Oncology

## 2017-06-18 DIAGNOSIS — C649 Malignant neoplasm of unspecified kidney, except renal pelvis: Secondary | ICD-10-CM

## 2017-06-23 ENCOUNTER — Other Ambulatory Visit: Payer: Self-pay | Admitting: Oncology

## 2017-06-23 ENCOUNTER — Ambulatory Visit (HOSPITAL_BASED_OUTPATIENT_CLINIC_OR_DEPARTMENT_OTHER): Payer: Medicare Other | Admitting: Oncology

## 2017-06-23 ENCOUNTER — Other Ambulatory Visit (HOSPITAL_BASED_OUTPATIENT_CLINIC_OR_DEPARTMENT_OTHER): Payer: Medicare Other

## 2017-06-23 ENCOUNTER — Telehealth: Payer: Self-pay | Admitting: Oncology

## 2017-06-23 VITALS — BP 136/86 | HR 74 | Temp 97.8°F | Resp 20 | Ht 75.0 in | Wt 185.9 lb

## 2017-06-23 DIAGNOSIS — C7889 Secondary malignant neoplasm of other digestive organs: Secondary | ICD-10-CM

## 2017-06-23 DIAGNOSIS — C7800 Secondary malignant neoplasm of unspecified lung: Secondary | ICD-10-CM | POA: Diagnosis not present

## 2017-06-23 DIAGNOSIS — Z7901 Long term (current) use of anticoagulants: Secondary | ICD-10-CM | POA: Diagnosis not present

## 2017-06-23 DIAGNOSIS — C649 Malignant neoplasm of unspecified kidney, except renal pelvis: Secondary | ICD-10-CM

## 2017-06-23 DIAGNOSIS — I10 Essential (primary) hypertension: Secondary | ICD-10-CM | POA: Diagnosis not present

## 2017-06-23 DIAGNOSIS — C7951 Secondary malignant neoplasm of bone: Secondary | ICD-10-CM

## 2017-06-23 DIAGNOSIS — K8689 Other specified diseases of pancreas: Secondary | ICD-10-CM

## 2017-06-23 DIAGNOSIS — F419 Anxiety disorder, unspecified: Secondary | ICD-10-CM

## 2017-06-23 DIAGNOSIS — I2699 Other pulmonary embolism without acute cor pulmonale: Secondary | ICD-10-CM | POA: Diagnosis not present

## 2017-06-23 DIAGNOSIS — K611 Rectal abscess: Secondary | ICD-10-CM

## 2017-06-23 LAB — COMPREHENSIVE METABOLIC PANEL
ALT: 23 U/L (ref 0–55)
AST: 19 U/L (ref 5–34)
Albumin: 3.1 g/dL — ABNORMAL LOW (ref 3.5–5.0)
Alkaline Phosphatase: 88 U/L (ref 40–150)
Anion Gap: 7 mEq/L (ref 3–11)
BUN: 11.2 mg/dL (ref 7.0–26.0)
CO2: 29 mEq/L (ref 22–29)
Calcium: 8.8 mg/dL (ref 8.4–10.4)
Chloride: 104 mEq/L (ref 98–109)
Creatinine: 0.9 mg/dL (ref 0.7–1.3)
EGFR: >90 mL/min/{1.73_m2} (ref >90)
Glucose: 113 mg/dl (ref 70–140)
Potassium: 4.4 mEq/L (ref 3.5–5.1)
Sodium: 140 mEq/L (ref 136–145)
Total Bilirubin: <0.22 mg/dL (ref 0.20–1.20)
Total Protein: 6.4 g/dL (ref 6.4–8.3)

## 2017-06-23 LAB — CBC WITH DIFFERENTIAL/PLATELET
BASO%: 0.3 % (ref 0.0–2.0)
Basophils Absolute: 0 10*3/uL (ref 0.0–0.1)
EOS%: 2.9 % (ref 0.0–7.0)
Eosinophils Absolute: 0.2 10*3/uL (ref 0.0–0.5)
HCT: 40.3 % (ref 38.4–49.9)
HGB: 12.9 g/dL — ABNORMAL LOW (ref 13.0–17.1)
LYMPH%: 35.9 % (ref 14.0–49.0)
MCH: 33.2 pg (ref 27.2–33.4)
MCHC: 32 g/dL (ref 32.0–36.0)
MCV: 103.9 fL — ABNORMAL HIGH (ref 79.3–98.0)
MONO#: 0.2 10*3/uL (ref 0.1–0.9)
MONO%: 4.1 % (ref 0.0–14.0)
NEUT#: 3.3 10*3/uL (ref 1.5–6.5)
NEUT%: 56.8 % (ref 39.0–75.0)
Platelets: 124 10*3/uL — ABNORMAL LOW (ref 140–400)
RBC: 3.88 10*6/uL — ABNORMAL LOW (ref 4.20–5.82)
RDW: 15.2 % — ABNORMAL HIGH (ref 11.0–14.6)
WBC: 5.8 10*3/uL (ref 4.0–10.3)
lymph#: 2.1 10*3/uL (ref 0.9–3.3)

## 2017-06-23 MED ORDER — DIPHENOXYLATE-ATROPINE 2.5-0.025 MG PO TABS: 1.0000 | ORAL_TABLET | Freq: Four times a day (QID) | ORAL | 0 refills | Status: DC | PRN

## 2017-06-23 MED ORDER — ALPRAZOLAM 1 MG PO TABS
ORAL_TABLET | ORAL | 0 refills | Status: DC
Start: 2017-06-23 — End: 2017-07-30

## 2017-06-23 NOTE — Telephone Encounter (Signed)
Scheduled appt per 7/3 los - Gave patient AVS and calender per los. Central radiology to contact patient.

## 2017-06-23 NOTE — Progress Notes (Signed)
Hematology and Oncology Follow Up Visit  Keith [name] [id number] Dec 22, 1966 51 y.o. 06/23/2017 1:43 PM  Keith Bring, MD  Keith [name], M.D.  Keith Bent, MD  [name], MD    Principle Diagnosis: 51 year old gentleman with stage IV renal cell carcinoma diagnosed in 2009.  Prior Therapy: 1. Status post laparoscopic radical nephrectomy.  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  2. Patient status post thoracotomy for a synchronous metastatic lung lesions done October 2009.  He had a lower lobe nodule, biopsy proven to be metastatic renal cell carcinoma.   3. Patient is status post stereotactic radiotherapy to pulmonary nodules in May of 2010. 4. He is S/P Sutent 50 mg 4 weeks on 2 weeks off from 10/2010 to 03/2013. He progressed at that time.  5. He is S/P radiation to the right sacral bone between 4/22 to 4/30.  6. He is S/P XRT to the left shoulder 03/20/14 to 03/31/14. 7. Votrient 800 mg by mouth daily from 03/2013 through 06/22/2015. Discontinued secondary to disease progression. 8. Nivolumab 3 mg/kg given every 2 weeks started on 06/29/2015. He is status post 4 cycles completed 08/10/2015. He developed disease progression in September 2016.  9. Status post radiation therapy to the left mid fibula completed on 11/14/2015. He received a grade 1 fraction.    Current therapy:  Cabometyx 60 mg daily started in November 2016.   Interim History:  [name] presents today for a followup visit. Since his last visit, he continues to do reasonably well without any complaints. He continues to take Cabometyx at the 60 mg dose without new complications. His diarrhea is manageable with Imodium and Lomotil. He denied any hand-foot syndrome, fatigue or decline in his quality of life or performance status. His appetite not dramatically changed.  He remains reasonably active and attends to activities of daily living. He does report lower extremity pain which has  not dramatically changed. He still able to ambulate and perform fine motor activities without any decline. He continues to use Lovenox without any bleeding or thrombosis.  He did not report any headaches blurred vision or double vision. Does not report any seizure activity or alteration of mental status. Does not report any abdominal pain or hematochezia. Does not report any hematuria but does report occasional hesitancy and nocturia. He does not report any rashes or lesions or petechiae. He does not report any lymphadenopathy.  Remainder of his review of system is unremarkable.  Medications: I have reviewed the patient's current medications.  Current Outpatient Prescriptions  Medication Sig Dispense Refill  . ALPRAZolam (XANAX) 1 MG tablet TAKE 1 TABLET THREE TIMES A DAY AS NEEDED FOR ANXIETY 90 tablet 0  . CABOMETYX 60 MG TABS TAKE 1 TABLET BY MOUTH ONE TIME DAILY WITH AT LEAST 8 OZ OF WATER ON AN EMPTY STOMACH. DO NOT EAT FOR 2 HOURS BEFORE OR 1 HOUR AFTER. SWALLO 30 tablet 0  . calcium carbonate (TUMS - DOSED IN MG ELEMENTAL CALCIUM) 500 MG chewable tablet Chew 1 tablet by mouth as needed for indigestion or heartburn.    . dextromethorphan (ROBITUSSIN MAXIMUM STRENGTH) 15 MG/5ML syrup Take 10 mLs (30 mg total) by mouth 4 (four) times daily as needed for cough. 120 mL 0  . diphenhydrAMINE (BENADRYL) 25 mg capsule Take 25 mg by mouth every 6 (six) hours as needed for itching.    . diphenoxylate-atropine (LOMOTIL) 2.5-0.025 MG tablet Take 1 tablet by mouth 4 (four) times daily  as needed for diarrhea or loose stools. 60 tablet 0  . DULoxetine (CYMBALTA) 60 MG capsule TAKE 1 CAPSULE (60 MG TOTAL) BY MOUTH 2 (TWO) TIMES DAILY. 180 capsule 0  . enoxaparin (LOVENOX) 150 MG/ML injection INJECT 0.93 MLS (140 MG TOTAL) INTO THE SKIN DAILY. 30 Syringe 1  . feeding supplement, ENSURE ENLIVE, (ENSURE ENLIVE) LIQD Take 237 mLs by mouth 2 (two) times daily between meals. 237 mL 12  . gabapentin (NEURONTIN) 300  MG capsule Take 1 capsule (300 mg total) by mouth 3 (three) times daily. 270 capsule 0  . HYDROmorphone (DILAUDID) 8 MG tablet Take 1 tablet (8 mg total) by mouth every 4 (four) hours as needed for moderate pain or severe pain. 50 tablet 0  . ibuprofen (ADVIL,MOTRIN) 200 MG tablet Take 400 mg by mouth every 6 (six) hours as needed for fever, headache, moderate pain or cramping.    . insulin glargine (LANTUS) 100 UNIT/ML injection Inject 26 Units into the skin at bedtime.    [name] lactulose (CHRONULAC) 10 GM/15ML solution Take 30 mLs (20 g total) by mouth 2 (two) times daily. 240 mL 0  . metoprolol tartrate (LOPRESSOR) 25 MG tablet Take 1 tablet (25 mg total) by mouth 2 (two) times daily. 60 tablet 0  . quiNINE (QUALAQUIN) 324 MG capsule Take 2 capsules (648 mg total) by mouth 3 (three) times daily. 30 capsule 0  . senna-docusate (SENOKOT-S) 8.6-50 MG tablet Take 1 tablet by mouth 2 (two) times daily. 30 tablet 0  . tiZANidine (ZANAFLEX) 4 MG tablet Take 4 mg by mouth every 8 (eight) hours as needed for muscle spasms.   0  . VIAGRA 100 MG tablet Take 50 mg by mouth as needed for erectile dysfunction.      No current facility-administered medications for this visit.      Allergies:  Allergies  Allergen Reactions  . Ceftriaxone Hives  . Hydrocodone Swelling    Past Medical History, Surgical history, Social history, and Family History were reviewed and updated.   Physical Exam: Blood pressure 136/86, pulse 74, temperature 97.8 F (36.6 C), temperature source Oral, resp. rate 20, height 6\' 3"  (1.905 m), weight 185 lb 14.4 oz (84.3 kg), SpO2 99 %. ECOG: 1 General appearance: Well-appearing gentleman appeared without distress. Head: Normocephalic, without obvious abnormality. No oral thrush or ulcers. Neck: no adenopathy, no masses.  Lymph nodes: Cervical, supraclavicular, and axillary nodes normal. Heart:regular rate and rhythm, S1, S2.  Lung: Clear throughout auscultation. No wheezes  noted. Abdomen: Soft, nontender without rebound or guarding. No rebound or guarding. EXT:  Slight protrusion noted on his left lower extremity unchanged. Neuro: No focal deficits noted.   Lab Results: Lab Results  Component Value Date   WBC 5.8 06/23/2017   HGB 12.9 (L) 06/23/2017   HCT 40.3 06/23/2017   MCV 103.9 (H) 06/23/2017   PLT 124 (L) 06/23/2017     Impression and Plan:  51 year old gentleman with the following issues.  1. Metastatic renal cell carcinoma.  He has documented disease to the lung and the pancreas. He is status post multiple therapies. He is currently on Cabometyx and have tolerated it well. CT scan on 03/10/2017 did not show any major changes. There are no new lesions noted. The plan is to continue with the same dose and schedule given his excellent benefit on this medication for the last 2-1/2 years. He will have repeat imaging studies in August 2018. 2. Depression/Anxiety: He remains on Xanax and Cymbalta. His mood  appears stable and appropriate. 3. Weight loss: He continues to have excellent appetite and weight is stable. 4. Left lower extremity pain and a mass: Pain is improved after a nerve block completed in January 2018. No changes needed at this time. 5. Hypertension: Controlled without antihypertensive medication. 6. Pulmonary embolism: He is currently on Lovenox without any recent complications of bleeding or thrombosis. I recommended continuing Lovenox indefinitely for the time being. 7. Prognosis: He does have an incurable malignancy however he continues to have excellent performance status and aggressive therapy is warranted. 8. Diarrhea: Continues to be manageable. Continue aggressive treatment with Lomotil and Imodium. Dose reduction will be needed if this becomes a problem. 9. Follow up: in 4 weeks.   [facility], MD 06/23/17

## 2017-06-26 ENCOUNTER — Other Ambulatory Visit: Payer: Self-pay | Admitting: Oncology

## 2017-07-16 IMAGING — MR MR [PERSON_NAME] LOW WO/W CM*L*
4 of 9 series · 19 of 40 positions shown · IV contrast (Y)
Comparison: None.

CLINICAL DATA: History of metastatic renal cell carcinoma with a
mass in the proximal left fibula. Pain.

EXAM:
MRI OF LOWER LEFT EXTREMITY WITHOUT AND WITH CONTRAST
TECHNIQUE: Multiplanar, multisequence MR imaging of the left lower leg was
performed both before and after administration of intravenous
contrast.
CONTRAST:  18 ml MULTIHANCE GADOBENATE DIMEGLUMINE 529 MG/ML IV SOLN

[Series 3: T1 · axial · 7.0mm · 0.72mm/px · z∈[-298,+98]mm · 5 of 45 slices shown (1 of 2)]
[im 1/45]
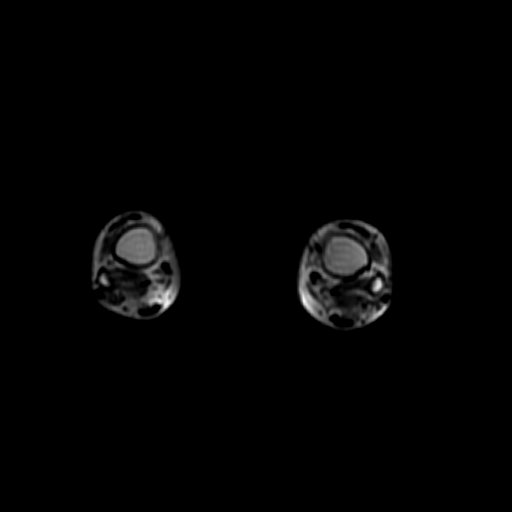
[im 12/45]
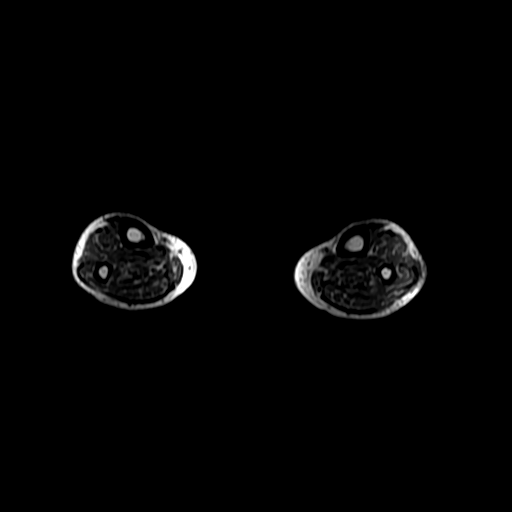
[im 23/45]
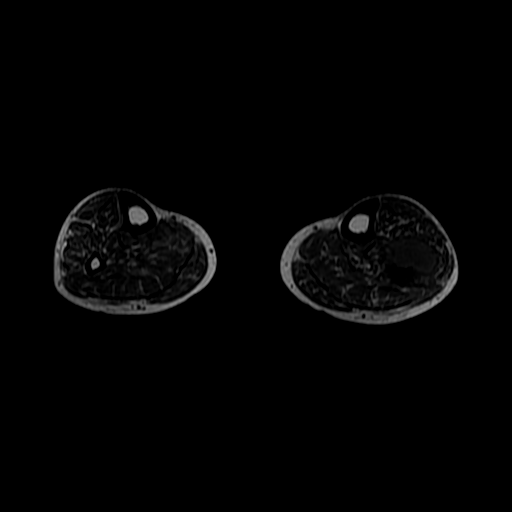
[im 34/45]
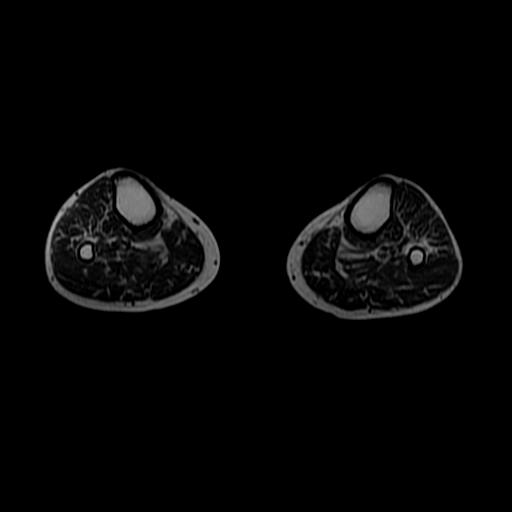
[im 45/45]
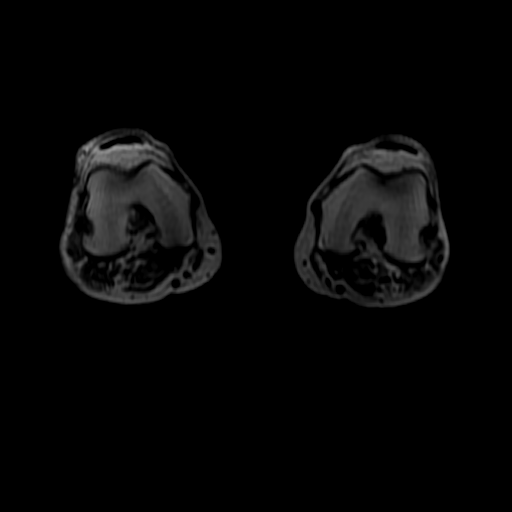

[Series 4: T1 fat-sat · axial · 7.0mm · 0.72mm/px · z∈[-318,+123]mm · 5 of 50 slices shown]
[im 1/50]
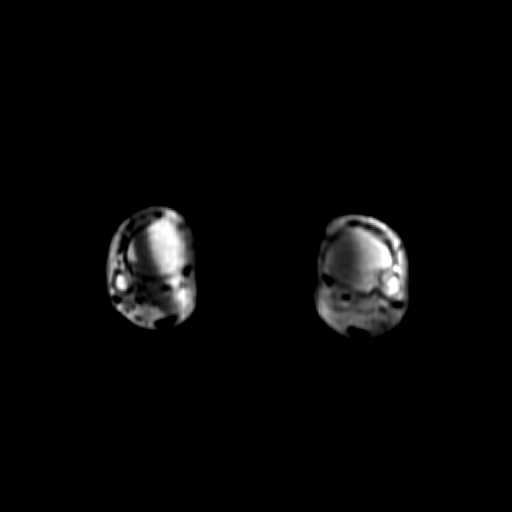
[im 10/50]
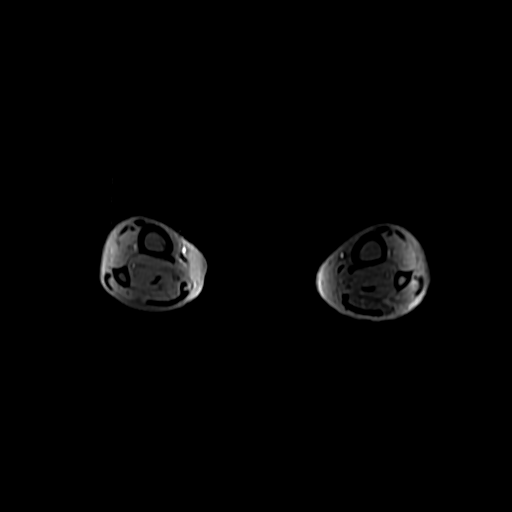
[im 20/50]
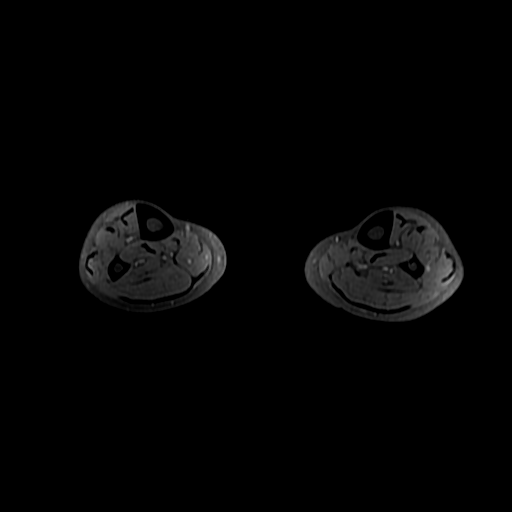
[im 30/50]
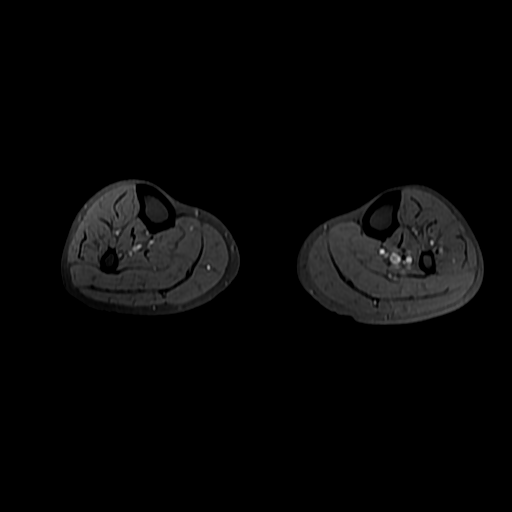
[im 50/50]
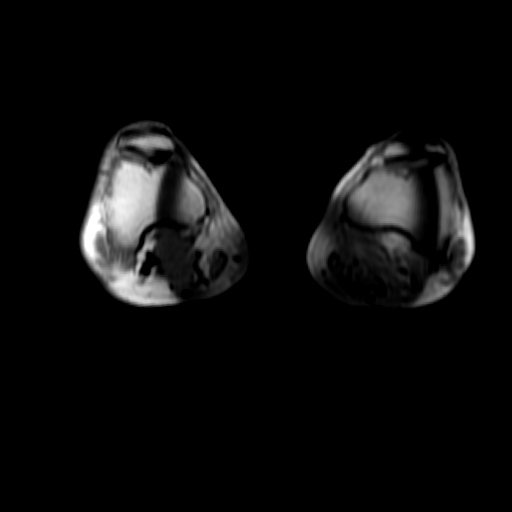

[Series 5: T2 fat-sat · axial · 7.0mm · 0.72mm/px · z∈[-318,+123]mm · 6 of 50 slices shown]
[im 1/50]
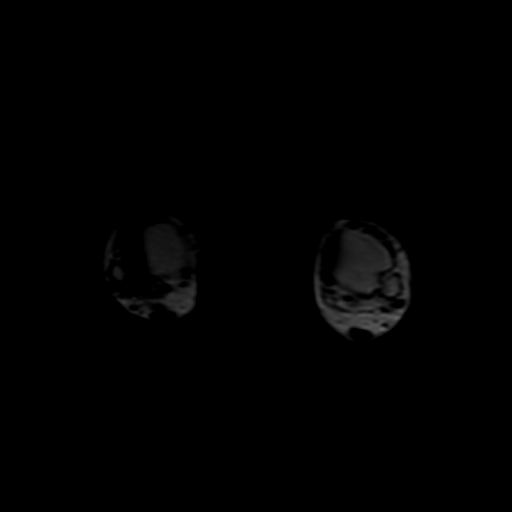
[im 10/50]
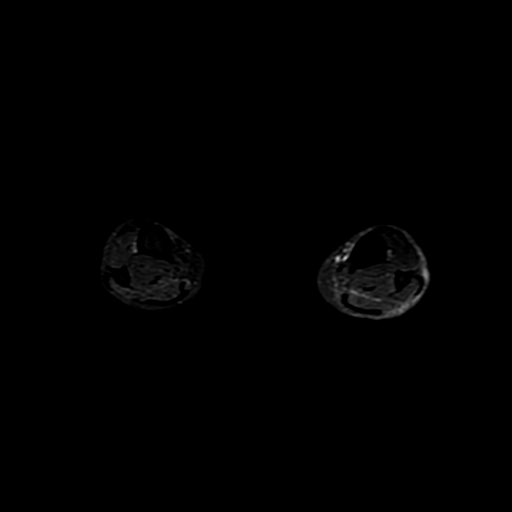
[im 20/50]
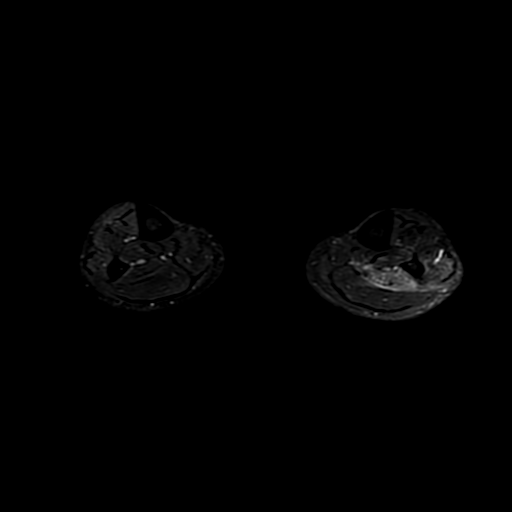
[im 30/50]
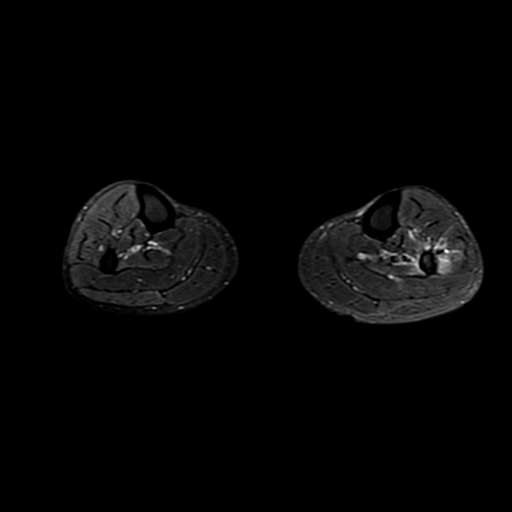
[im 40/50]
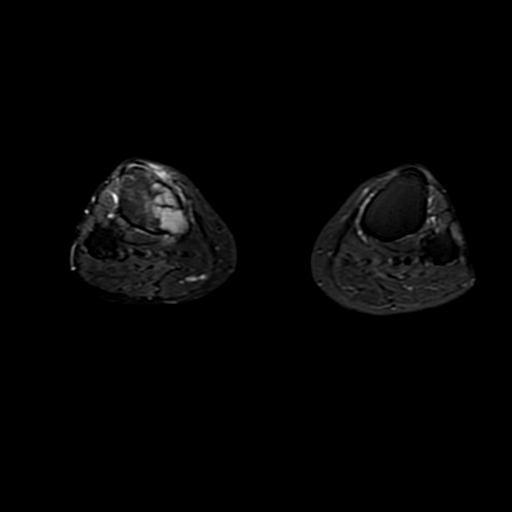
[im 50/50]
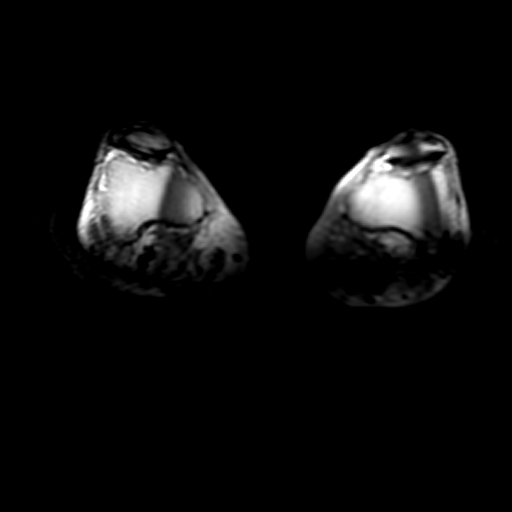

[Series 6: T1 · coronal · 4.0mm · 0.86mm/px · 3 of 29 slices shown (2 of 2)]
[im 1/29]
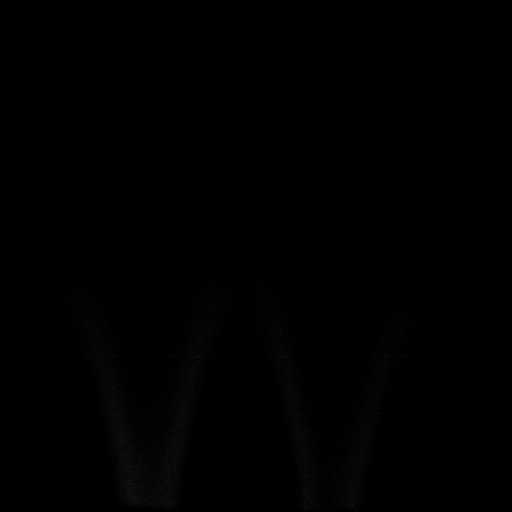
[im 15/29]
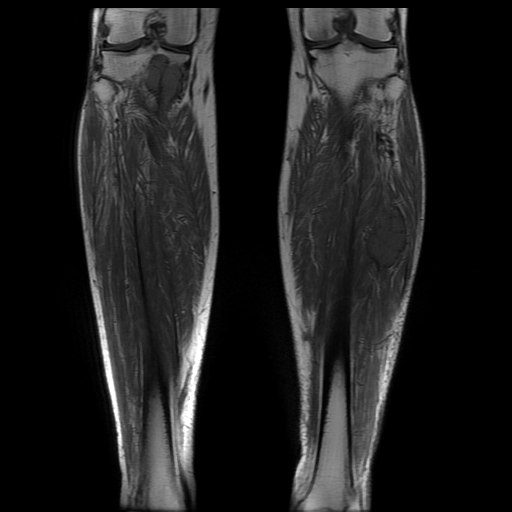
[im 29/29]
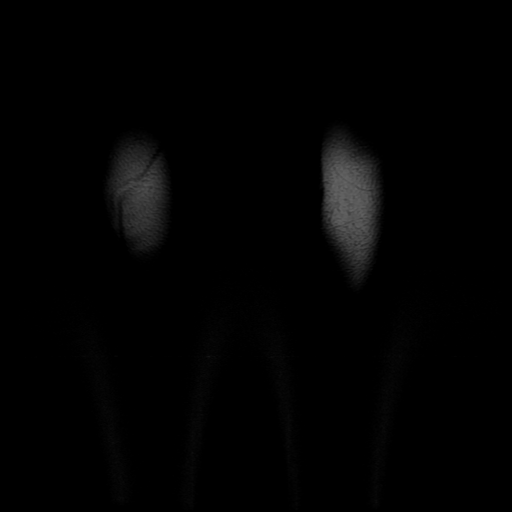

[19 of 40 positions shown; findings below may reference images not displayed]

FINDINGS: The axial and coronal sequences incidentally include the right lower
leg. In the left lower leg, there is a large enhancing mass at the
junction of the proximal and middle thirds of the left fibula. The
lesion measures 3.6 cm transverse by 3.4 cm AP by 5.7 cm
craniocaudal on postcontrast imaging and is consistent with a
metastatic deposit. There is cortical destruction association with
the lesion. No other focal lesion is identified in the left lower
leg.

Incidental imaging of the right lower leg demonstrates an enhancing
mass in the medial metaphysis of the right tibia which measures
cm transverse by 4.1 cm craniocaudal by 4.4 cm AP and is consistent
with a metastatic lesion. Incidentally imaged right lower leg is
otherwise unremarkable.
IMPRESSION: Large destructive lesions in left fibula and right tibia as
described above are consistent with metastatic renal cell carcinoma.

## 2017-07-16 IMAGING — CT CT CHEST W/ CM
2 of 11 series · 12 of 46 positions shown, 17 images · IV contrast (OMNIPAQUE)
Comparison: CT chest abdomen pelvis dated 06/20/2015.

CLINICAL DATA: Renal cancer in 9777 status post left nephrectomy,
lung cancer 8885 status post right lung resection, XRT, and
chemotherapy, oral chemotherapy ongoing.

EXAM:
CT CHEST, ABDOMEN, AND PELVIS WITH CONTRAST
TECHNIQUE: Multidetector CT imaging of the chest, abdomen and pelvis was
performed following the standard protocol during bolus
administration of intravenous contrast.
CONTRAST:  100mL OMNIPAQUE IOHEXOL 300 MG/ML  SOLN

[Series 6: venous · axial · portal-venous · 0.84mm/px · z∈[-620,-59]mm · 10 of 225 slices shown, 15 images]
[im 19/225  soft-tissue]
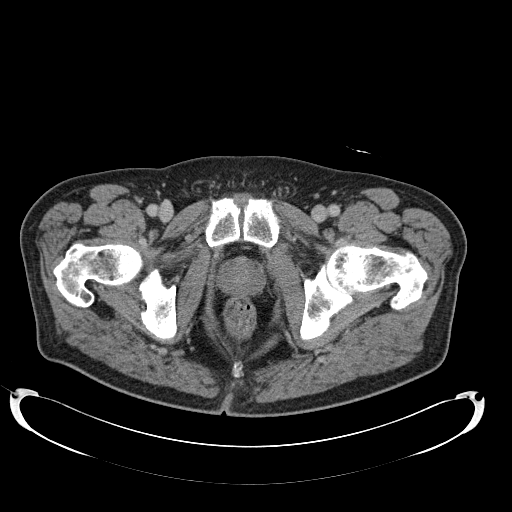
[im 19/225  bone]
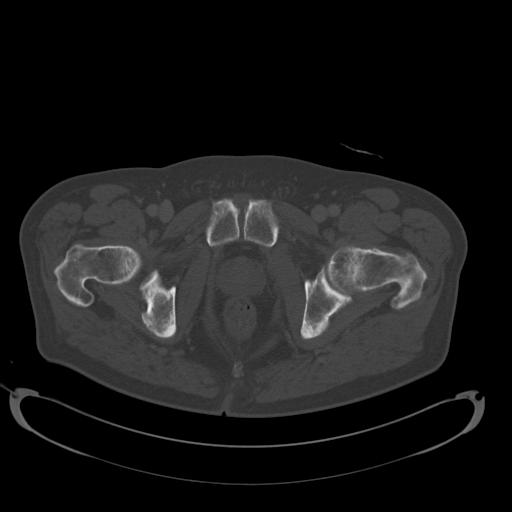
[im 38/225  soft-tissue]
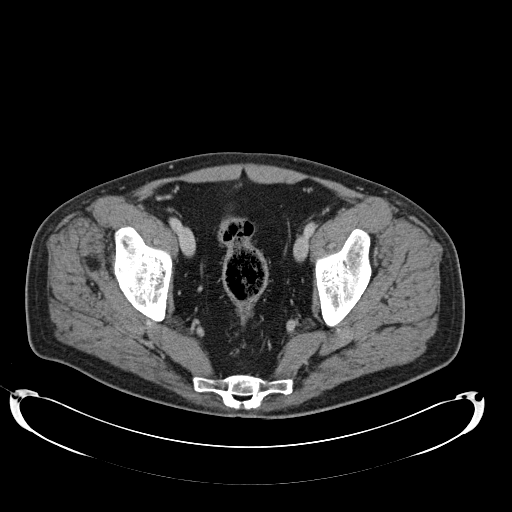
[im 75/225  soft-tissue]
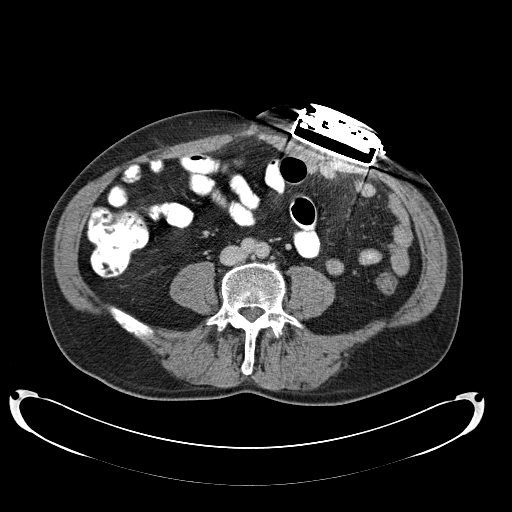
[im 94/225  soft-tissue]
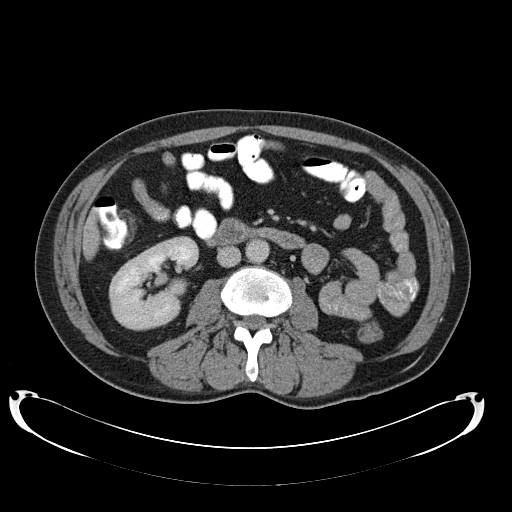
[im 113/225  soft-tissue]
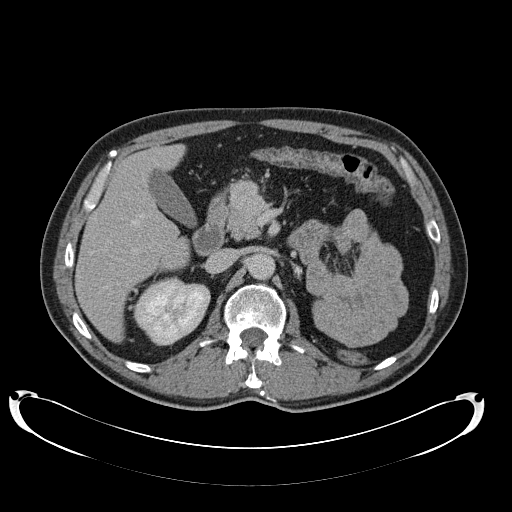
[im 131/225  soft-tissue]
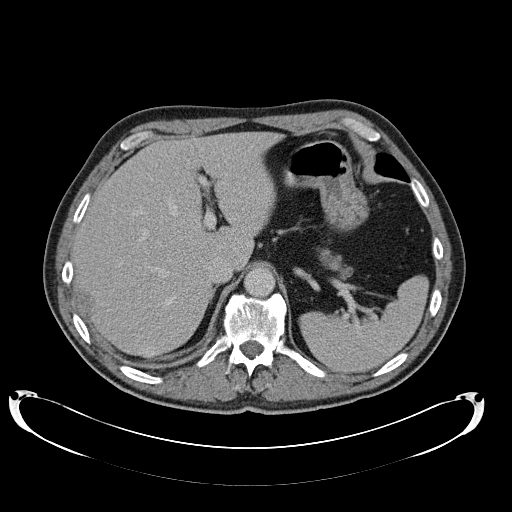
[im 150/225  soft-tissue]
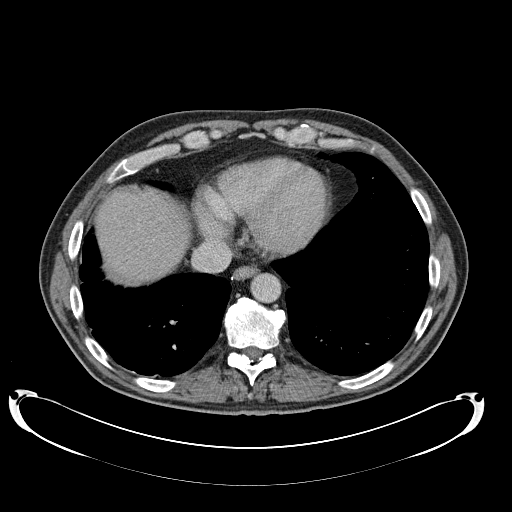
[im 150/225  lung]
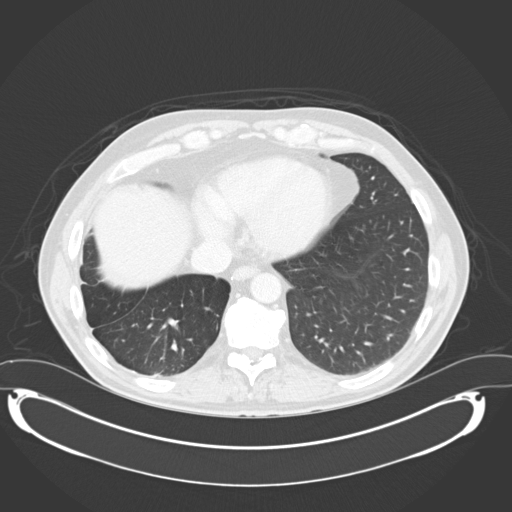
[im 169/225  lung]
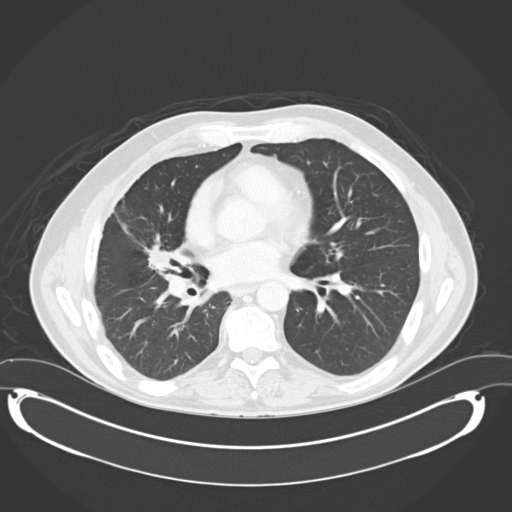
[im 187/225  soft-tissue]
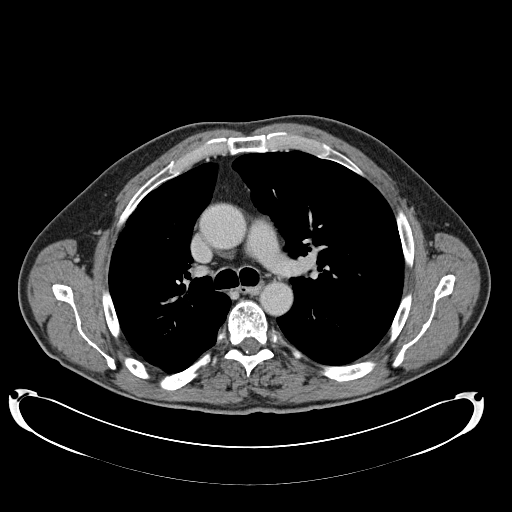
[im 187/225  lung]
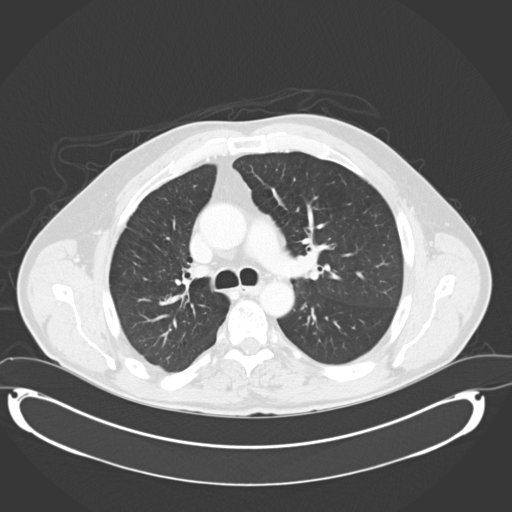
[im 206/225  soft-tissue]
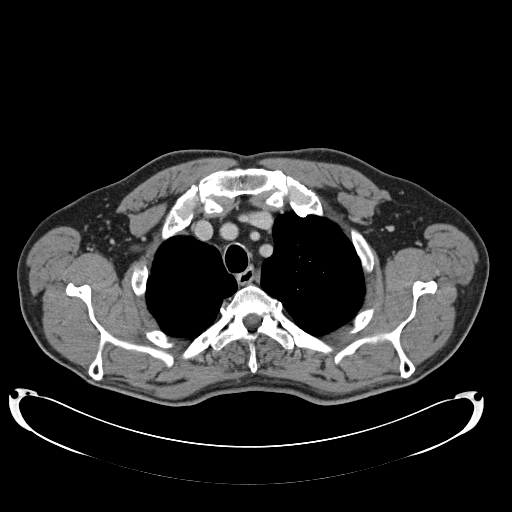
[im 206/225  lung]
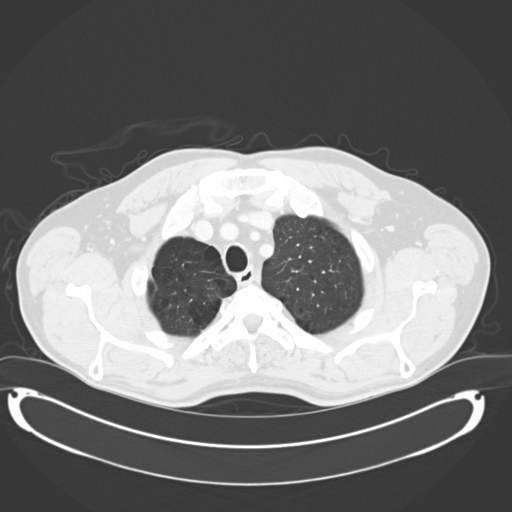
[im 206/225  bone]
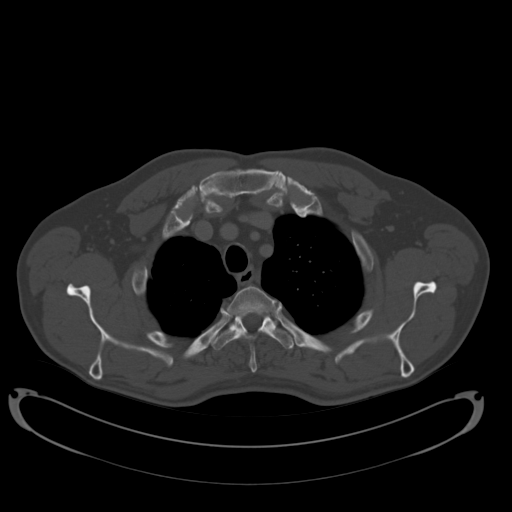

[Series 602: <mpr thick range> · coronal · 0.84mm/px · 2 of 88 slices shown]
[im 30/88  soft-tissue]
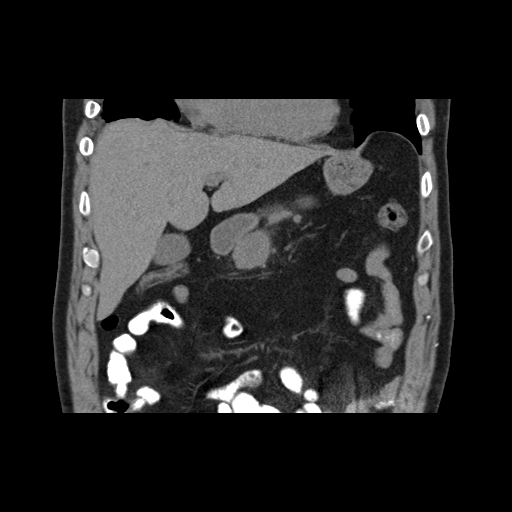
[im 59/88  soft-tissue]
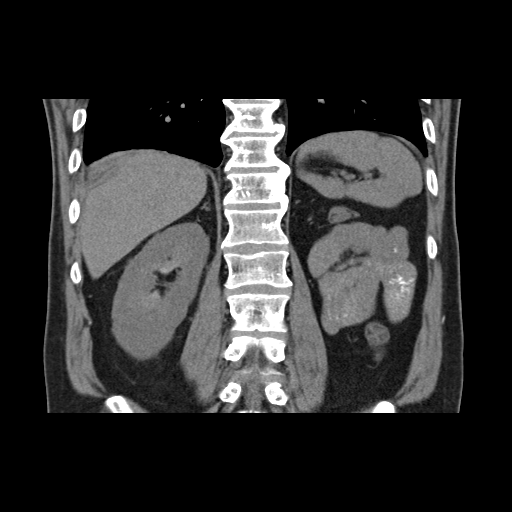

[12 of 46 positions shown; findings below may reference images not displayed]

FINDINGS: CT CHEST FINDINGS

Mediastinum/Nodes: The heart is normal in size. No pericardial
effusion.

No suspicious mediastinal or axillary lymphadenopathy. 2.6 x 2.7 cm
left infrahilar node (series 6/image 48), previously 2.5 x 1.5 cm.

Visualized thyroid is unremarkable.

Lungs/Pleura: Status post wedge resection in the right lower lobe
(series 8/ image 65). Associated volume loss in the right
hemithorax.

Suspected radiation changes in the right upper lobe and along the
right upper lobe bronchus (series 8/images 30 and 33).

Abnormal soft tissue/bronchial wall thickening along the right
middle lobe bronchus (series 8/ image 61), grossly unchanged.

Underlying moderate centrilobular and paraseptal emphysematous
changes.

No focal consolidation.

No pleural effusion or pneumothorax.

Musculoskeletal: Degenerative changes of the thoracic spine.

CT ABDOMEN PELVIS FINDINGS

Hepatobiliary: Liver is notable for an 11 mm enhancing lesion along
the posterior segment right hepatic lobe (series 4/ image 29),
essentially new, suspicious for metastases.

Gallbladder is unremarkable. No intrahepatic or extrahepatic ductal
dilatation.

Pancreas: 3.4 x 2.9 cm enhancing mass along the anterior aspect of
the pancreatic head/ body (series 4/ image 43), previously 2.7 x
cm. Additional 1.8 cm enhancing lesion in the pancreatic body
(series 4/ image 35), previously 1.5 cm, with additional small
enhancing lesions in the pancreatic body/tail (series 4/ image 34).
Associated atrophy of the pancreatic tail with ductal dilatation
measuring up to 11 mm, previously 10 mm.

Spleen: Within normal limits.

Adrenals/Urinary Tract: Adrenal glands are within normal limits.

Right kidney is within normal limits.  No hydronephrosis.

Status post left nephrectomy. No abnormal soft tissue in the
surgical bed.

Stomach/Bowel: Stomach is within normal limits.

No evidence of bowel obstruction.

Normal appendix.

Left colon is underdistended.

Vascular/Lymphatic: No evidence of abdominal aortic aneurysm.

No suspicious abdominopelvic lymphadenopathy.

Reproductive: Prostate is unremarkable.

Other: No abdominopelvic ascites.

Metallic hardware in the left anterior abdominal wall.

Musculoskeletal: Mild degenerative changes of the lumbar spine.

Right sacral metastasis with associated 2.9 x 2.4 cm soft tissue
component (series 6/image 136), previously 2.8 x 2.2 cm.
IMPRESSION: Postsurgical/postradiation changes in the right hemithorax.

Mild progression of left infrahilar nodal metastasis. Right middle
lobe peribronchial soft tissue is grossly unchanged.

Mild progression of pancreatic metastases, as described above. New
hepatic metastasis.

Mild progression of right sacral metastasis.

Status post left nephrectomy.

## 2017-07-21 ENCOUNTER — Other Ambulatory Visit: Payer: Self-pay | Admitting: Oncology

## 2017-07-23 DIAGNOSIS — R030 Elevated blood-pressure reading, without diagnosis of hypertension: Secondary | ICD-10-CM | POA: Diagnosis not present

## 2017-07-23 DIAGNOSIS — D649 Anemia, unspecified: Secondary | ICD-10-CM | POA: Insufficient documentation

## 2017-07-23 DIAGNOSIS — Z9689 Presence of other specified functional implants: Secondary | ICD-10-CM | POA: Diagnosis not present

## 2017-07-23 DIAGNOSIS — G620 Drug-induced polyneuropathy: Secondary | ICD-10-CM | POA: Diagnosis not present

## 2017-07-23 DIAGNOSIS — G629 Polyneuropathy, unspecified: Secondary | ICD-10-CM | POA: Diagnosis not present

## 2017-07-23 DIAGNOSIS — T451X5A Adverse effect of antineoplastic and immunosuppressive drugs, initial encounter: Secondary | ICD-10-CM | POA: Diagnosis not present

## 2017-07-23 DIAGNOSIS — M25551 Pain in right hip: Secondary | ICD-10-CM | POA: Diagnosis not present

## 2017-07-28 ENCOUNTER — Other Ambulatory Visit (HOSPITAL_BASED_OUTPATIENT_CLINIC_OR_DEPARTMENT_OTHER): Payer: Medicare Other

## 2017-07-28 ENCOUNTER — Encounter (HOSPITAL_COMMUNITY): Payer: Self-pay

## 2017-07-28 ENCOUNTER — Ambulatory Visit (HOSPITAL_COMMUNITY)
Admission: RE | Admit: 2017-07-28 | Discharge: 2017-07-28 | Disposition: A | Discharge status: Home / Self Care | Payer: Medicare Other | Source: Ambulatory Visit | Attending: Oncology | Admitting: Oncology

## 2017-07-28 ENCOUNTER — Other Ambulatory Visit: Payer: Self-pay | Admitting: *Deleted

## 2017-07-28 DIAGNOSIS — C7889 Secondary malignant neoplasm of other digestive organs: Secondary | ICD-10-CM | POA: Diagnosis not present

## 2017-07-28 DIAGNOSIS — C649 Malignant neoplasm of unspecified kidney, except renal pelvis: Secondary | ICD-10-CM

## 2017-07-28 DIAGNOSIS — M899 Disorder of bone, unspecified: Secondary | ICD-10-CM | POA: Diagnosis not present

## 2017-07-28 DIAGNOSIS — J439 Emphysema, unspecified: Secondary | ICD-10-CM | POA: Insufficient documentation

## 2017-07-28 DIAGNOSIS — C642 Malignant neoplasm of left kidney, except renal pelvis: Secondary | ICD-10-CM | POA: Diagnosis not present

## 2017-07-28 DIAGNOSIS — R918 Other nonspecific abnormal finding of lung field: Secondary | ICD-10-CM | POA: Diagnosis not present

## 2017-07-28 LAB — COMPREHENSIVE METABOLIC PANEL
ALT: 27 U/L (ref 0–55)
AST: 23 U/L (ref 5–34)
Albumin: 3.4 g/dL — ABNORMAL LOW (ref 3.5–5.0)
Alkaline Phosphatase: 93 U/L (ref 40–150)
Anion Gap: 7 mEq/L (ref 3–11)
BUN: 5.4 mg/dL — ABNORMAL LOW (ref 7.0–26.0)
CO2: 29 mEq/L (ref 22–29)
Calcium: 9.1 mg/dL (ref 8.4–10.4)
Chloride: 103 mEq/L (ref 98–109)
Creatinine: 1 mg/dL (ref 0.7–1.3)
EGFR: 86 mL/min/{1.73_m2} — ABNORMAL LOW (ref >90)
Glucose: 92 mg/dl (ref 70–140)
Potassium: 3.8 mEq/L (ref 3.5–5.1)
Sodium: 139 mEq/L (ref 136–145)
Total Bilirubin: 0.31 mg/dL (ref 0.20–1.20)
Total Protein: 7 g/dL (ref 6.4–8.3)

## 2017-07-28 LAB — CBC WITH DIFFERENTIAL/PLATELET
BASO%: 0.6 % (ref 0.0–2.0)
Basophils Absolute: 0 10*3/uL (ref 0.0–0.1)
EOS%: 1.9 % (ref 0.0–7.0)
Eosinophils Absolute: 0.1 10*3/uL (ref 0.0–0.5)
HCT: 42.2 % (ref 38.4–49.9)
HGB: 14 g/dL (ref 13.0–17.1)
LYMPH%: 33.1 % (ref 14.0–49.0)
MCH: 32.8 pg (ref 27.2–33.4)
MCHC: 33.1 g/dL (ref 32.0–36.0)
MCV: 98.8 fL — ABNORMAL HIGH (ref 79.3–98.0)
MONO#: 0.4 10*3/uL (ref 0.1–0.9)
MONO%: 6.6 % (ref 0.0–14.0)
NEUT#: 3.5 10*3/uL (ref 1.5–6.5)
NEUT%: 57.8 % (ref 39.0–75.0)
Platelets: 157 10*3/uL (ref 140–400)
RBC: 4.27 10*6/uL (ref 4.20–5.82)
RDW: 16.3 % — ABNORMAL HIGH (ref 11.0–14.6)
WBC: 6.1 10*3/uL (ref 4.0–10.3)
lymph#: 2 10*3/uL (ref 0.9–3.3)

## 2017-07-28 MED ORDER — IOPAMIDOL (ISOVUE-300) INJECTION 61%
100.0000 mL | Freq: Once | INTRAVENOUS | Status: AC | PRN
  Administered 2017-07-28: 100 mL via INTRAVENOUS

## 2017-07-28 MED ORDER — IOPAMIDOL (ISOVUE-300) INJECTION 61%
INTRAVENOUS | Status: AC
  Filled 2017-07-28: qty 100

## 2017-07-30 ENCOUNTER — Ambulatory Visit (HOSPITAL_BASED_OUTPATIENT_CLINIC_OR_DEPARTMENT_OTHER): Payer: Medicare Other | Admitting: Oncology

## 2017-07-30 ENCOUNTER — Other Ambulatory Visit: Payer: Self-pay | Admitting: Oncology

## 2017-07-30 ENCOUNTER — Telehealth: Payer: Self-pay | Admitting: Oncology

## 2017-07-30 VITALS — BP 111/95 | HR 84 | Temp 98.3°F | Resp 18 | Ht 75.0 in | Wt 182.5 lb

## 2017-07-30 DIAGNOSIS — Z7901 Long term (current) use of anticoagulants: Secondary | ICD-10-CM

## 2017-07-30 DIAGNOSIS — C7800 Secondary malignant neoplasm of unspecified lung: Secondary | ICD-10-CM | POA: Diagnosis not present

## 2017-07-30 DIAGNOSIS — F418 Other specified anxiety disorders: Secondary | ICD-10-CM

## 2017-07-30 DIAGNOSIS — R197 Diarrhea, unspecified: Secondary | ICD-10-CM

## 2017-07-30 DIAGNOSIS — R634 Abnormal weight loss: Secondary | ICD-10-CM

## 2017-07-30 DIAGNOSIS — C7889 Secondary malignant neoplasm of other digestive organs: Secondary | ICD-10-CM

## 2017-07-30 DIAGNOSIS — C649 Malignant neoplasm of unspecified kidney, except renal pelvis: Secondary | ICD-10-CM | POA: Diagnosis not present

## 2017-07-30 DIAGNOSIS — K611 Rectal abscess: Secondary | ICD-10-CM

## 2017-07-30 DIAGNOSIS — C7951 Secondary malignant neoplasm of bone: Secondary | ICD-10-CM

## 2017-07-30 DIAGNOSIS — K8689 Other specified diseases of pancreas: Secondary | ICD-10-CM

## 2017-07-30 DIAGNOSIS — F419 Anxiety disorder, unspecified: Secondary | ICD-10-CM

## 2017-07-30 DIAGNOSIS — M79662 Pain in left lower leg: Secondary | ICD-10-CM

## 2017-07-30 DIAGNOSIS — I2699 Other pulmonary embolism without acute cor pulmonale: Secondary | ICD-10-CM | POA: Diagnosis not present

## 2017-07-30 DIAGNOSIS — R2242 Localized swelling, mass and lump, left lower limb: Secondary | ICD-10-CM

## 2017-07-30 MED ORDER — ALPRAZOLAM 1 MG PO TABS
ORAL_TABLET | ORAL | 0 refills | Status: DC
Start: 2017-07-30 — End: 2017-09-02

## 2017-07-30 NOTE — Progress Notes (Signed)
Hematology and Oncology Follow Up Visit  Keith [name] [id number] 06-30-1966 51 y.o. 07/30/2017 2:54 PM  Keith Bring, MD  Keith [name], M.D.  [name], MD  [name], MD    Principle Diagnosis: 51 year old gentleman with stage IV renal cell carcinoma diagnosed in 2009.  Prior Therapy: 1. Status post laparoscopic radical nephrectomy.  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  2. Patient status post thoracotomy for a synchronous metastatic lung lesions done October 2009.  He had a lower lobe nodule, biopsy proven to be metastatic renal cell carcinoma.   3. Patient is status post stereotactic radiotherapy to pulmonary nodules in May of 2010. 4. He is S/P Sutent 50 mg 4 weeks on 2 weeks off from 10/2010 to 03/2013. He progressed at that time.  5. He is S/P radiation to the right sacral bone between 4/22 to 4/30.  6. He is S/P XRT to the left shoulder 03/20/14 to 03/31/14. 7. Votrient 800 mg by mouth daily from 03/2013 through 06/22/2015. Discontinued secondary to disease progression. 8. Nivolumab 3 mg/kg given every 2 weeks started on 06/29/2015. He is status post 4 cycles completed 08/10/2015. He developed disease progression in September 2016.  9. Status post radiation therapy to the left mid fibula completed on 11/14/2015. He received a grade 1 fraction.    Current therapy:  Cabometyx 60 mg daily started in November 2016.   Interim History:  Keith [name] presents today for a followup visit. Since his last visit, he reports no major changes in his health. She did report worsening diarrhea the last few days after drinking contrast for his CT scan. He continues to take Cabometyx at the 60 mg dose without any other complications. His diarrhea is manageable usually with Imodium and Lomotil. He denied any hand-foot syndrome, fatigue or decline in his quality of life or performance status. His appetite is reasonable but did lose 3 pounds because of  diarrhea.  His pain is manageable at this time with a morphine pump and as needed Dilaudid prescribed by the pain clinic. He is scheduled to have a potential nerve block because of increased pain in his left lower extremity. He continues to use Lovenox without any bleeding or thrombosis.  He did not report any headaches blurred vision or double vision. Does not report any seizure activity or alteration of mental status. Does not report any abdominal pain or hematochezia. Does not report any hematuria but does report occasional hesitancy and nocturia. He does not report any rashes or lesions or petechiae. He does not report any lymphadenopathy.  Remainder of his review of system is unremarkable.  Medications: I have reviewed the patient's current medications.  Current Outpatient Prescriptions  Medication Sig Dispense Refill  . ALPRAZolam (XANAX) 1 MG tablet TAKE 1 TABLET THREE TIMES A DAY AS NEEDED FOR ANXIETY 90 tablet 0  . CABOMETYX 60 MG TABS TAKE 1 TABLET BY MOUTH ONE TIME DAILY WITH AT LEAST 8 OZ OF WATER ON AN EMPTY STOMACH. DO NOT EAT FOR 2 HOURS BEFORE OR 1 HOUR AFTER. SWALLO 30 tablet 0  . calcium carbonate (TUMS - DOSED IN MG ELEMENTAL CALCIUM) 500 MG chewable tablet Chew 1 tablet by mouth as needed for indigestion or heartburn.    . dextromethorphan (ROBITUSSIN MAXIMUM STRENGTH) 15 MG/5ML syrup Take 10 mLs (30 mg total) by mouth 4 (four) times daily as needed for cough. 120 mL 0  . diphenhydrAMINE (BENADRYL) 25 mg capsule Take 25 mg by  mouth every 6 (six) hours as needed for itching.    . diphenoxylate-atropine (LOMOTIL) 2.5-0.025 MG tablet Take 1 tablet by mouth 4 (four) times daily as needed for diarrhea or loose stools. 60 tablet 0  . DULoxetine (CYMBALTA) 60 MG capsule TAKE 1 CAPSULE (60 MG TOTAL) BY MOUTH 2 (TWO) TIMES DAILY. 180 capsule 0  . enoxaparin (LOVENOX) 150 MG/ML injection INJECT 0.93 MLS (140 MG TOTAL) INTO THE SKIN DAILY. 30 Syringe 1  . feeding supplement, ENSURE ENLIVE,  (ENSURE ENLIVE) LIQD Take 237 mLs by mouth 2 (two) times daily between meals. 237 mL 12  . gabapentin (NEURONTIN) 300 MG capsule TAKE 1 CAPSULE (300 MG TOTAL) BY MOUTH 3 (THREE) TIMES DAILY. 270 capsule 0  . HYDROmorphone (DILAUDID) 8 MG tablet Take 1 tablet (8 mg total) by mouth every 4 (four) hours as needed for moderate pain or severe pain. 50 tablet 0  . ibuprofen (ADVIL,MOTRIN) 200 MG tablet Take 400 mg by mouth every 6 (six) hours as needed for fever, headache, moderate pain or cramping.    . insulin glargine (LANTUS) 100 UNIT/ML injection Inject 26 Units into the skin at bedtime.    [name] lactulose (CHRONULAC) 10 GM/15ML solution Take 30 mLs (20 g total) by mouth 2 (two) times daily. 240 mL 0  . metoprolol tartrate (LOPRESSOR) 25 MG tablet Take 1 tablet (25 mg total) by mouth 2 (two) times daily. 60 tablet 0  . quiNINE (QUALAQUIN) 324 MG capsule Take 2 capsules (648 mg total) by mouth 3 (three) times daily. 30 capsule 0  . senna-docusate (SENOKOT-S) 8.6-50 MG tablet Take 1 tablet by mouth 2 (two) times daily. 30 tablet 0  . tiZANidine (ZANAFLEX) 4 MG tablet Take 4 mg by mouth every 8 (eight) hours as needed for muscle spasms.   0  . VIAGRA 100 MG tablet Take 50 mg by mouth as needed for erectile dysfunction.      No current facility-administered medications for this visit.      Allergies:  Allergies  Allergen Reactions  . Ceftriaxone Hives  . Hydrocodone Swelling    Past Medical History, Surgical history, Social history, and Family History were reviewed and updated.   Physical Exam: Blood pressure (!) 111/95, pulse 84, temperature 98.3 F (36.8 C), temperature source Oral, resp. rate 18, height 6\' 3"  (1.905 m), weight 182 lb 8 oz (82.8 kg), SpO2 99 %. ECOG: 1 General appearance: Alert, awake gentleman without distress.. Head: No ulcers or thrush. Neck: no adenopathy, no masses.  Lymph nodes: Cervical, supraclavicular, and axillary nodes normal. Heart:regular rate and rhythm, S1,  S2.  Lung: Clear throughout auscultation. No wheezes noted. Abdomen: Soft, nontender without rebound or guarding. No shifting dullness or ascites. EXT:  Slight protrusion noted on his left lower extremity unchanged. Neuro: No focal deficits noted.   Lab Results: Lab Results  Component Value Date   WBC 6.1 07/28/2017   HGB 14.0 07/28/2017   HCT 42.2 07/28/2017   MCV 98.8 (H) 07/28/2017   PLT 157 07/28/2017   CT ABDOMEN AND PELVIS WITH AND WITHOUT CONTRAST  TECHNIQUE: Multidetector CT imaging of the chest was performed during intravenous contrast administration. Multidetector CT imaging of the abdomen and pelvis was performed following the standard protocol before and during bolus administration of intravenous contrast.  CONTRAST:  100 cc Isovue-300 IV.  COMPARISON:  03/10/2017 CT chest, abdomen and pelvis.  FINDINGS: CT CHEST FINDINGS  Cardiovascular: Normal heart size. No significant pericardial fluid/thickening. Ectatic 4.0 cm ascending thoracic aorta, stable.  Normal caliber pulmonary arteries. No central pulmonary emboli.  Mediastinum/Nodes: No discrete thyroid nodules. Unremarkable esophagus. No pathologically enlarged axillary, mediastinal or hilar lymph nodes. Heterogeneous top-normal size 0.8 cm posterior left hilar node (series 11/ image 47) is stable.  Lungs/Pleura: No pneumothorax. No right pleural effusion. Trace dependent basilar left pleural effusion/thickening is stable. Mild-to-moderate centrilobular and paraseptal emphysema. Stable postsurgical changes from wedge resection in the right lower lobe. Central right middle lobe spiculated pulmonary nodule measures 2.3 x 2.1 cm (series 13/ image 89), previously 2.3 x 2.0 cm, stable. Irregular sharply marginated bandlike consolidation in the parahilar right upper lobe with associated volume loss and distortion is unchanged and compatible with radiation fibrosis. Irregular peripheral basilar left lower  lobe 2.3 x 1.3 cm nodule (series 13/ image 108), previously 2.4 x 1.3 cm, stable. No acute consolidative airspace disease or new significant pulmonary nodules.  Musculoskeletal: Stable mildly expansile sclerotic posterior right fourth rib focus, which may be treatment related. No new focal osseous lesions in the chest. Mild thoracic spondylosis.  CT ABDOMEN AND PELVIS FINDINGS  Hepatobiliary: Normal liver size. Subcentimeter subcapsular vague focus of arterial phase hyperenhancement of the posterior right liver lobe (series 6/ image 26) is occult on the other sequences and stable since 03/10/2017, most suggestive of a benign transient hepatic attenuation difference. Otherwise no liver lesions. Normal gallbladder with no radiopaque cholelithiasis. No biliary ductal dilatation.  Pancreas: Heterogeneous hyperenhancing 2.7 x 2.4 cm anterior pancreatic head mass (series 6/image 38), previously 2.7 x 2.4 cm, stable. Hyperenhancing 1.8 x 1.8 cm pancreatic body mass (series 6/image 20), previously 1.9 x 1.6 cm, stable, with associated stable pancreatic duct dilation and pancreatic parenchymal atrophy in the pancreatic tail distal to this lesion. No new pancreatic mass.  Spleen: Normal size. No mass.  Adrenals/Urinary Tract: Normal adrenals. Status post left nephrectomy. No mass or fluid collection in the left nephrectomy bed. Stable compensatory hypertrophy of the right kidney. No right renal stones. No right hydronephrosis hypodense 0.3 cm anterior lower right renal cortical lesion is too small to characterize and stable. Otherwise no right renal lesions. Normal bladder.  Stomach/Bowel: Grossly normal stomach. Normal caliber small bowel with no small bowel wall thickening. Normal appendix. Normal large bowel with no diverticulosis, large bowel wall thickening or pericolonic fat stranding.  Vascular/Lymphatic: Atherosclerotic nonaneurysmal abdominal aorta. Patent portal,  splenic, hepatic and right renal veins. No pathologically enlarged lymph nodes in the abdomen or pelvis.  Reproductive: Normal size prostate.  Other: No pneumoperitoneum, ascites or focal fluid collection. Stable stimulator device in the ventral left abdominal wall with lead coursing into and terminating in the posterior thoracic spinal canal.  Musculoskeletal: Mixed lytic and sclerotic lesions in the right upper sacrum, proximal right femoral metaphysis and medial inferior right pubic ramus appear stable. No new focal osseous lesions. Mild lumbar spondylosis.  IMPRESSION: 1. No new or progressive metastatic disease. 2. Treated bilateral pulmonary nodules appear stable . 3. 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 4. Osseous lesions in the right upper sacrum, proximal right femoral metaphysis and inferior right pubic ramus appear stable. 5. Aortic Atherosclerosis (ICD10-I70.0) and Emphysema (ICD10-J43.9). Stable ectatic 4.0 cm ascending thoracic aorta. Recommend annual imaging followup by CTA or MRA. This recommendation follows 2010 ACCF/AHA/AATS/ACR/ASA/SCA/SCAI/SIR/STS/SVM Guidelines for the Diagnosis and Management of Patients with Thoracic Aortic Disease. Circulation. 2010; 121: E993-Z169.   Impression and Plan:  51 year old gentleman with the following issues.  1. Metastatic renal cell carcinoma.  He has documented disease to the lung and the pancreas. He  is status post multiple therapies. He is currently on Cabometyx and have tolerated it well. CT scan on August 2018 was reviewed today and discussed with the patient. The scan did not show any major changes. There are no new lesions noted. Risks and benefits of continuing this medication at the current dose was reviewed today and is agreeable to continue. I offered him dose reduction giving his recent diarrhea refills his diarrhea is usually manageable. Dysplastic continue with 60 mg dosing for  now. 2. Depression/Anxiety: He remains on Xanax and Cymbalta. His mood appears stable and appropriate. Xanax was refilled today. 3. Weight loss: He continues to have excellent appetite and likely will gain his weight back. 4. Left lower extremity pain and a mass: He is planning to have a repeat no block in the near future. 5. Hypertension: Controlled without antihypertensive medication. 6. Pulmonary embolism: He is currently on Lovenox without any recent complications of bleeding or thrombosis. This will be continued indefinitely. 7. Prognosis: He does have an incurable malignancy however he continues to have excellent performance status and aggressive therapy is warranted. 8. Diarrhea: Continues to be manageable. Continue aggressive treatment with Lomotil and Imodium. Dose reduction will be needed if this becomes a problem. 9. Follow up: in 4 weeks.   [facility], MD 07/30/17

## 2017-07-30 NOTE — Telephone Encounter (Signed)
Scheduled appt per 8/9 los - Gave patient AVS and calender per los.  

## 2017-08-06 DIAGNOSIS — G5772 Causalgia of left lower limb: Secondary | ICD-10-CM | POA: Diagnosis not present

## 2017-08-06 DIAGNOSIS — M5417 Radiculopathy, lumbosacral region: Secondary | ICD-10-CM | POA: Diagnosis not present

## 2017-08-06 DIAGNOSIS — G629 Polyneuropathy, unspecified: Secondary | ICD-10-CM | POA: Diagnosis not present

## 2017-08-06 DIAGNOSIS — Z9689 Presence of other specified functional implants: Secondary | ICD-10-CM | POA: Diagnosis not present

## 2017-08-06 DIAGNOSIS — M79662 Pain in left lower leg: Secondary | ICD-10-CM | POA: Diagnosis not present

## 2017-08-06 DIAGNOSIS — G620 Drug-induced polyneuropathy: Secondary | ICD-10-CM | POA: Diagnosis not present

## 2017-08-06 DIAGNOSIS — R030 Elevated blood-pressure reading, without diagnosis of hypertension: Secondary | ICD-10-CM | POA: Diagnosis not present

## 2017-08-06 DIAGNOSIS — M25551 Pain in right hip: Secondary | ICD-10-CM | POA: Diagnosis not present

## 2017-09-02 ENCOUNTER — Other Ambulatory Visit: Payer: Self-pay | Admitting: *Deleted

## 2017-09-02 DIAGNOSIS — K611 Rectal abscess: Secondary | ICD-10-CM

## 2017-09-02 DIAGNOSIS — K8689 Other specified diseases of pancreas: Secondary | ICD-10-CM

## 2017-09-02 DIAGNOSIS — F419 Anxiety disorder, unspecified: Secondary | ICD-10-CM

## 2017-09-02 DIAGNOSIS — C649 Malignant neoplasm of unspecified kidney, except renal pelvis: Secondary | ICD-10-CM

## 2017-09-02 DIAGNOSIS — C7951 Secondary malignant neoplasm of bone: Secondary | ICD-10-CM

## 2017-09-02 MED ORDER — ALPRAZOLAM 1 MG PO TABS
ORAL_TABLET | ORAL | 0 refills | Status: DC
Start: 2017-09-02 — End: 2017-09-25

## 2017-09-03 ENCOUNTER — Ambulatory Visit: Payer: Medicare Other | Admitting: Oncology

## 2017-09-03 ENCOUNTER — Other Ambulatory Visit: Payer: Medicare Other

## 2017-09-09 ENCOUNTER — Other Ambulatory Visit: Payer: Self-pay | Admitting: Oncology

## 2017-09-09 DIAGNOSIS — C649 Malignant neoplasm of unspecified kidney, except renal pelvis: Secondary | ICD-10-CM

## 2017-09-10 IMAGING — CT CT ANGIO CHEST
3 of 8 series · 17 of 36 positions shown · IV contrast (OMNIPAQUE 350)
Comparison: 06/20/2015

CLINICAL DATA: Left-sided chest pain and shortness of Breath

EXAM:
CT ANGIOGRAPHY CHEST WITH CONTRAST
TECHNIQUE: Multidetector CT imaging of the chest was performed using the
standard protocol during bolus administration of intravenous
contrast. Multiplanar CT image reconstructions and MIPs were
obtained to evaluate the vascular anatomy.
CONTRAST:  100mL OMNIPAQUE IOHEXOL 350 MG/ML SOLN

[Series 8: thins for pacs · axial · 0.74mm/px · z∈[+1578,+1766]mm · 14 of 218 slices shown (1 of 2)]
[im 15/218  lung]
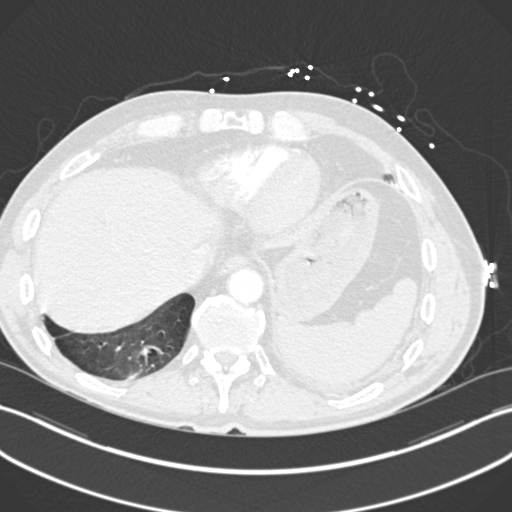
[im 29/218  mediastinal]
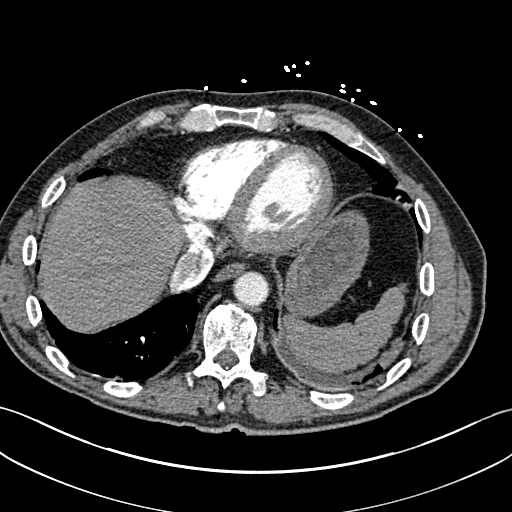
[im 44/218  lung]
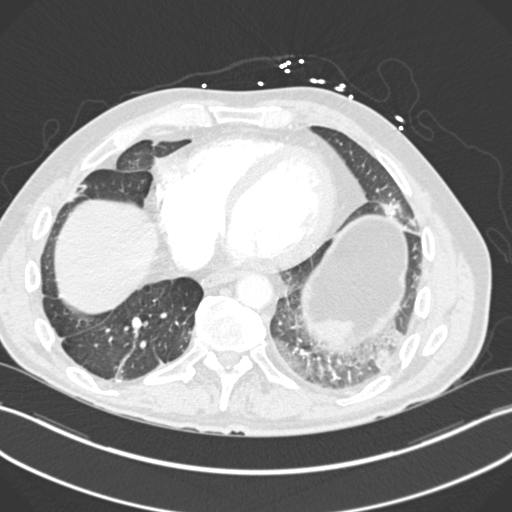
[im 58/218  mediastinal]
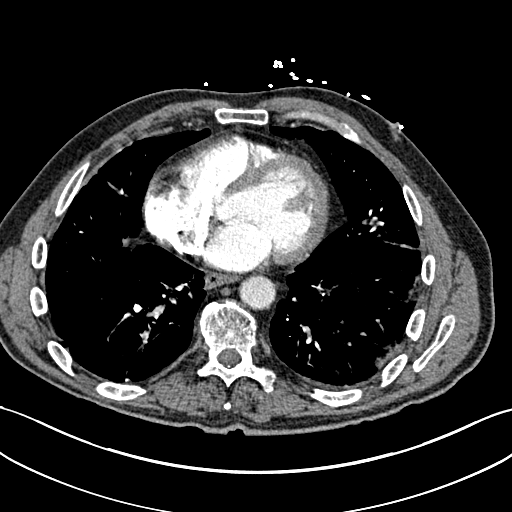
[im 73/218  lung]
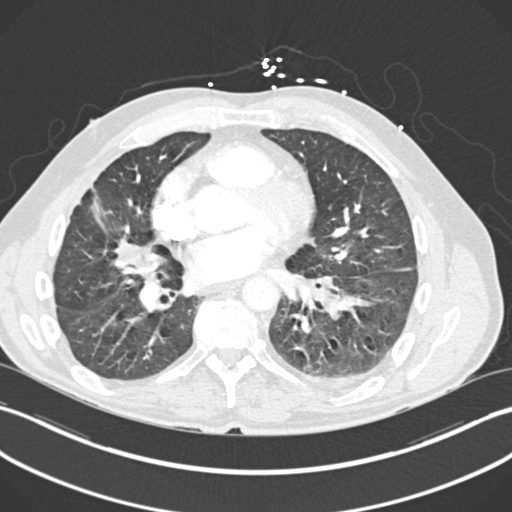
[im 87/218  mediastinal]
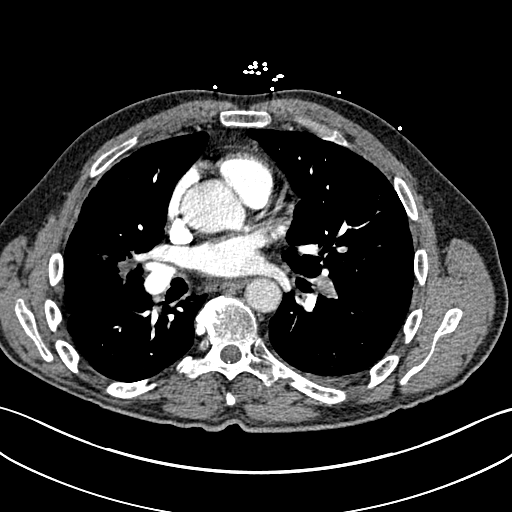
[im 102/218  lung]
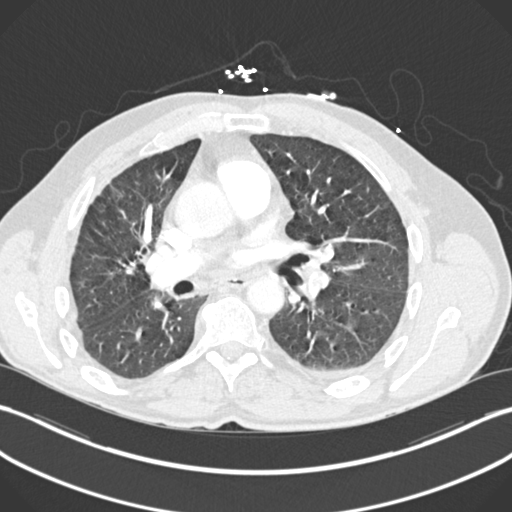
[im 116/218  mediastinal]
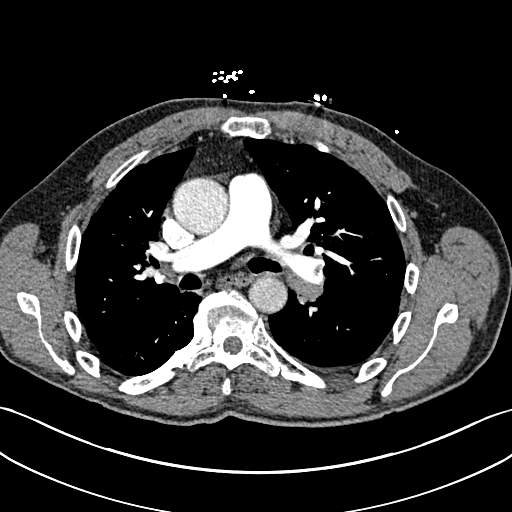
[im 131/218  lung]
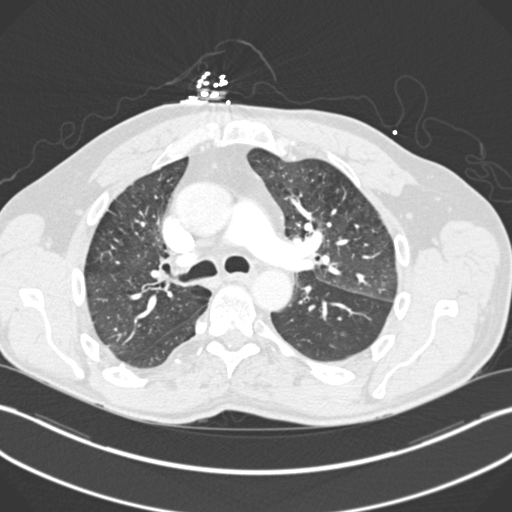
[im 145/218  mediastinal]
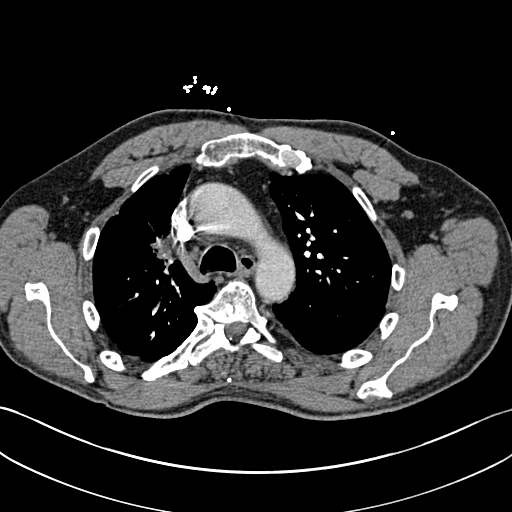
[im 160/218  lung]
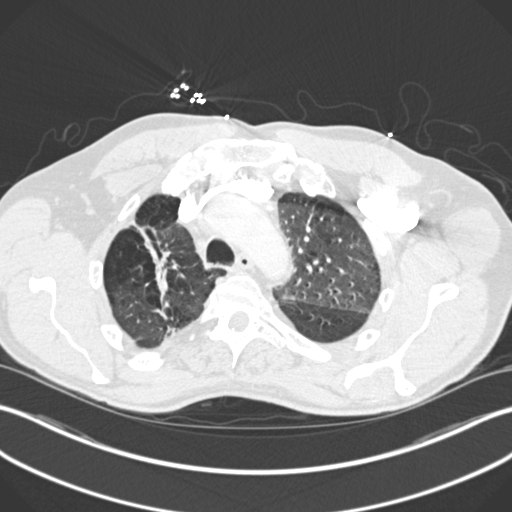
[im 174/218  mediastinal]
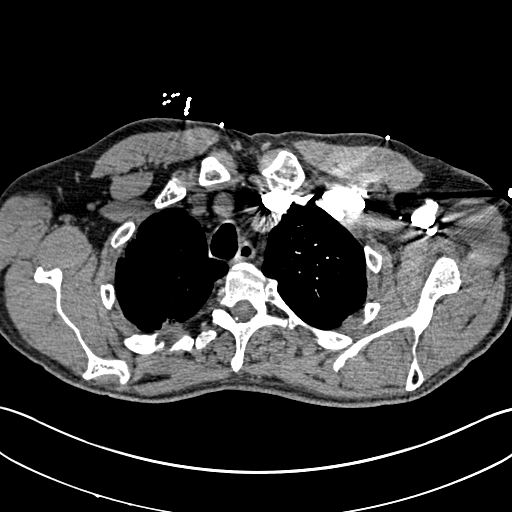
[im 189/218  lung]
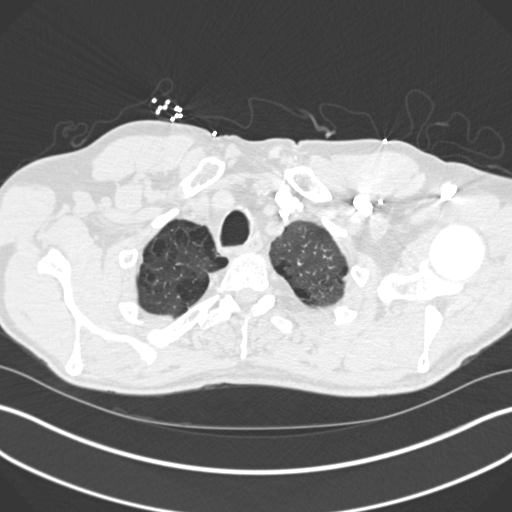
[im 203/218  mediastinal]
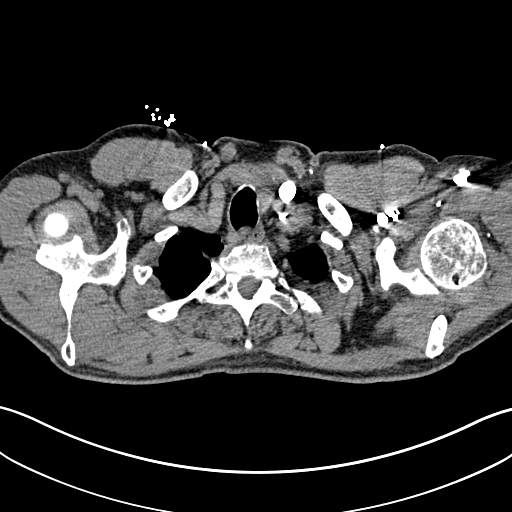

[Series 10: thins for pacs · axial · 0.74mm/px · z∈[+1528,+1544]mm · 2 of 60 slices shown (2 of 2)]
[im 15/60  lung]
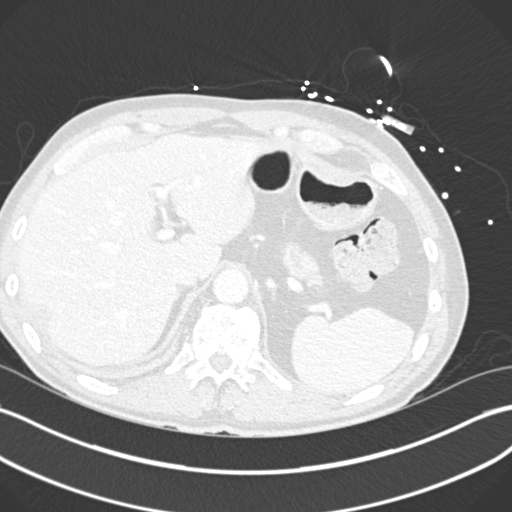
[im 30/60  lung]
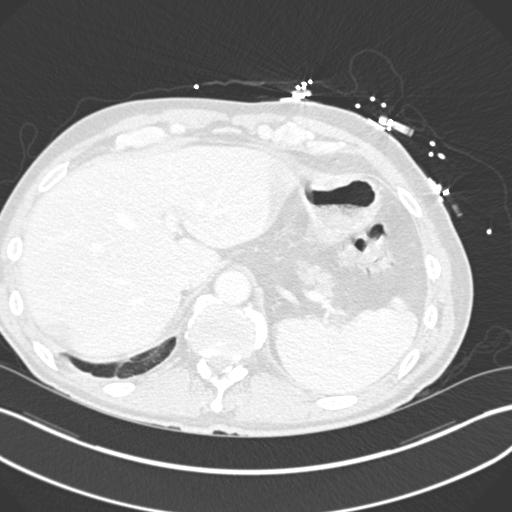

[Series 12: coronal mpr · coronal · 0.43mm/px · 1 of 130 slices shown]
[im 65/130  mediastinal]
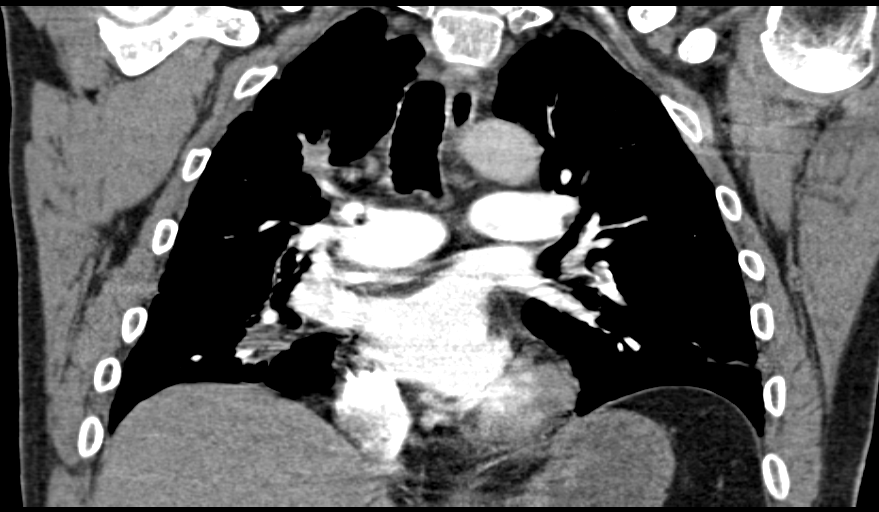

[17 of 36 positions shown; findings below may reference images not displayed]

FINDINGS: The lungs are well aerated bilaterally. Some emphysematous changes
are noted. Additionally there are changes consistent with prior
surgery on the right with radiation change. This is most noted in
the upper lobe. Persistent central soft tissue density is noted in
the right middle lobe measuring approximately 2.1 cm in greatest
dimension. This is roughly stable from the prior exam. No new focal
mass lesion is seen. Soft tissue density consistent with
lymphadenopathy is noted along the left lower lobe pulmonary
arterial branch stable from the previous exam.

The thoracic inlet is within normal limits. The thoracic aorta shows
mild aneurysmal dilatation of the ascending aorta measuring 4.2 cm
at the level of the main pulmonary artery. There are multiple
filling defects noted within the pulmonary arterial branches
involving the lingula and left lower lobe. The pulmonary arterial
branches on the right are within normal limits without evidence of
pulmonary emboli. No evidence of right heart strain is noted.

Scanning into the upper abdomen reveals no acute abnormality. The
osseous structures are within normal limits.

Review of the MIP images confirms the above findings.
IMPRESSION: Changes consistent with left sided pulmonary emboli as described. No
evidence of right heart strain is noted.

Stable scarring in the right upper lobe when compare with the prior
exam. There continues to be some soft tissue density in the right
middle lobe stable from the prior study. Additionally left hilar
adenopathy is noted stable from the previous exam.

Mild aneurysmal dilatation of the ascending thoracic aorta two
cm. Recommend annual imaging followup by CTA or MRA. This
recommendation follows 5303
ACCF/AHA/AATS/ACR/ASA/SCA/JESUS M/HERWAN/BILGUUN/BERCINTA Guidelines for the
Diagnosis and Management of Patients with Thoracic Aortic Disease.
Circulation. 5303; 121: [id number]

These results were called by telephone at the time of interpretation
on 11/07/2015 at [DATE] to LONGINA UHONE, PA , who verbally
acknowledged these results.

## 2017-09-10 IMAGING — CR DG CHEST 2V
2 series · 2 of 2 positions shown · non-contrast
Comparison: Chest CT 09/12/2015 06/20/2015

CLINICAL DATA: New onset left upper abdominal and lower chest pain
with shortness of breath. Symptoms worsening since yesterday.
History of metastatic renal cell carcinoma with metastasis to lung,
currently on chemotherapy.

EXAM:
CHEST  2 VIEW

[w chest pa]
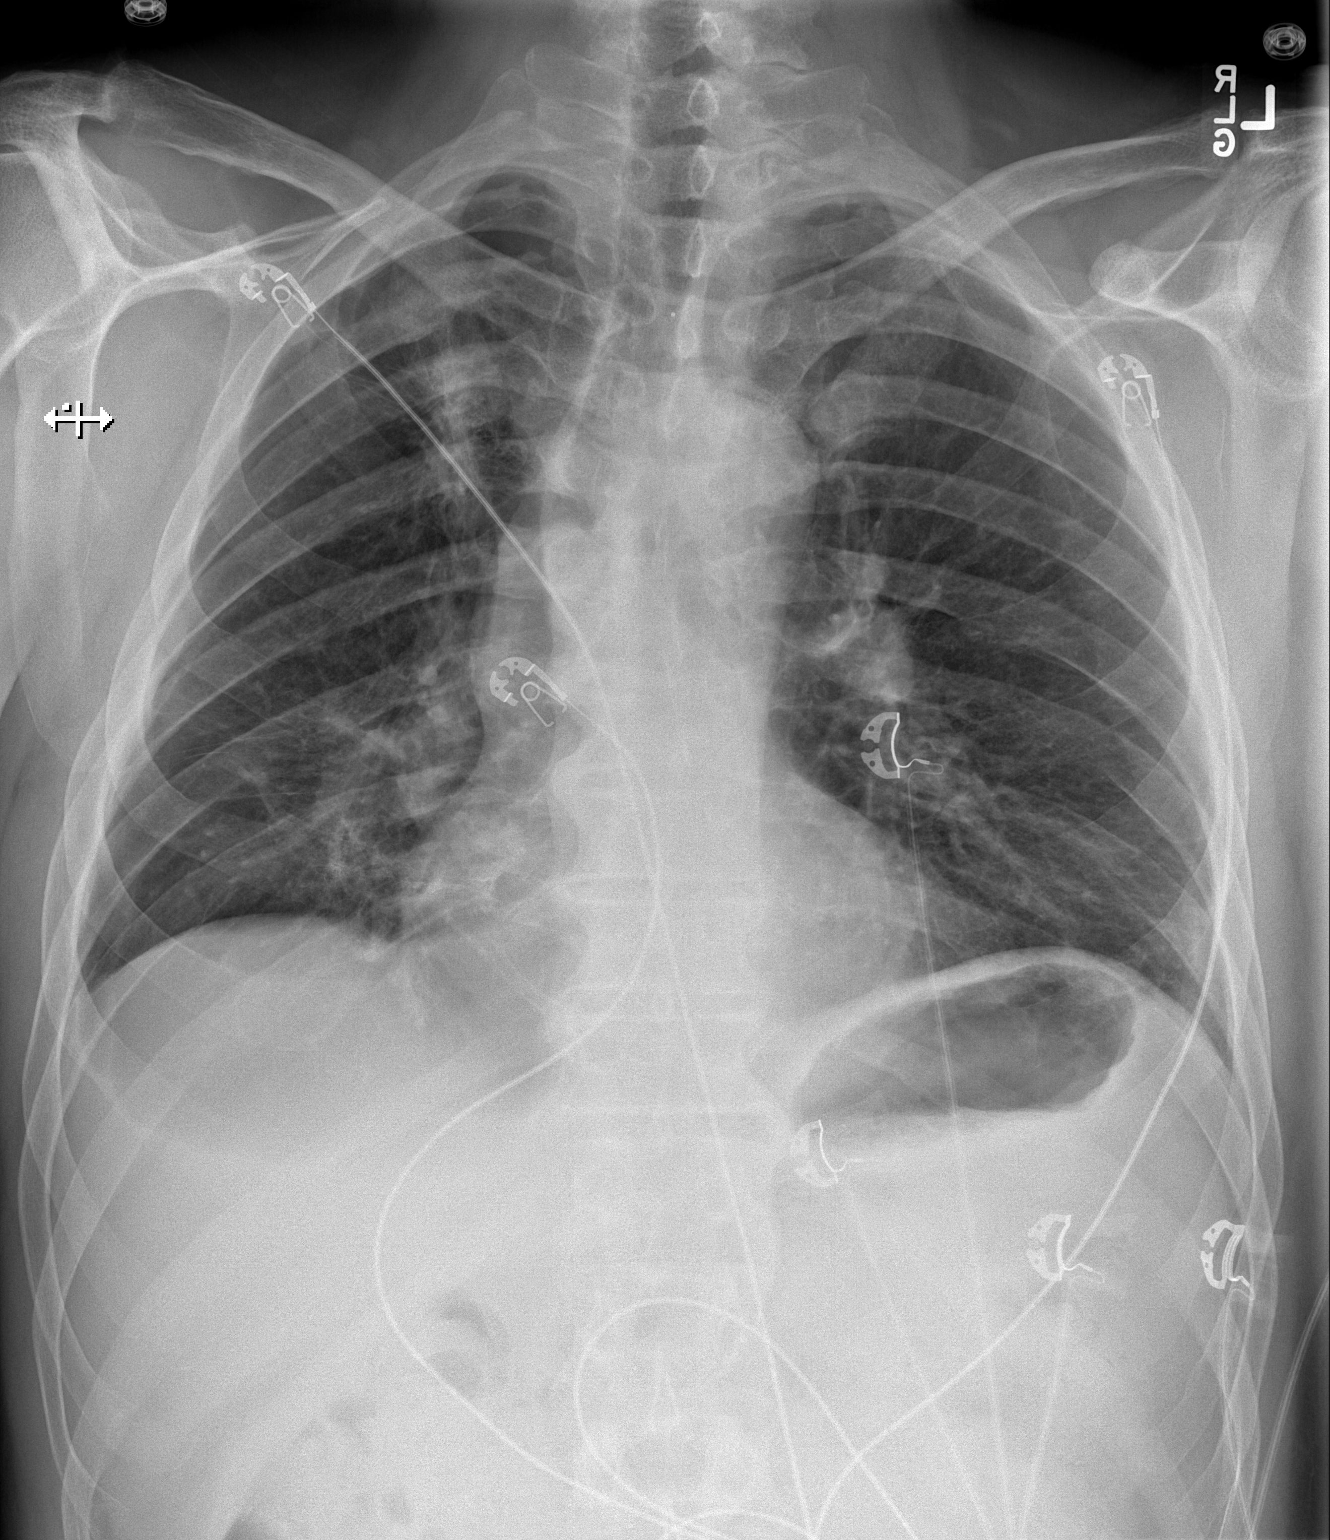

[w chest lat]
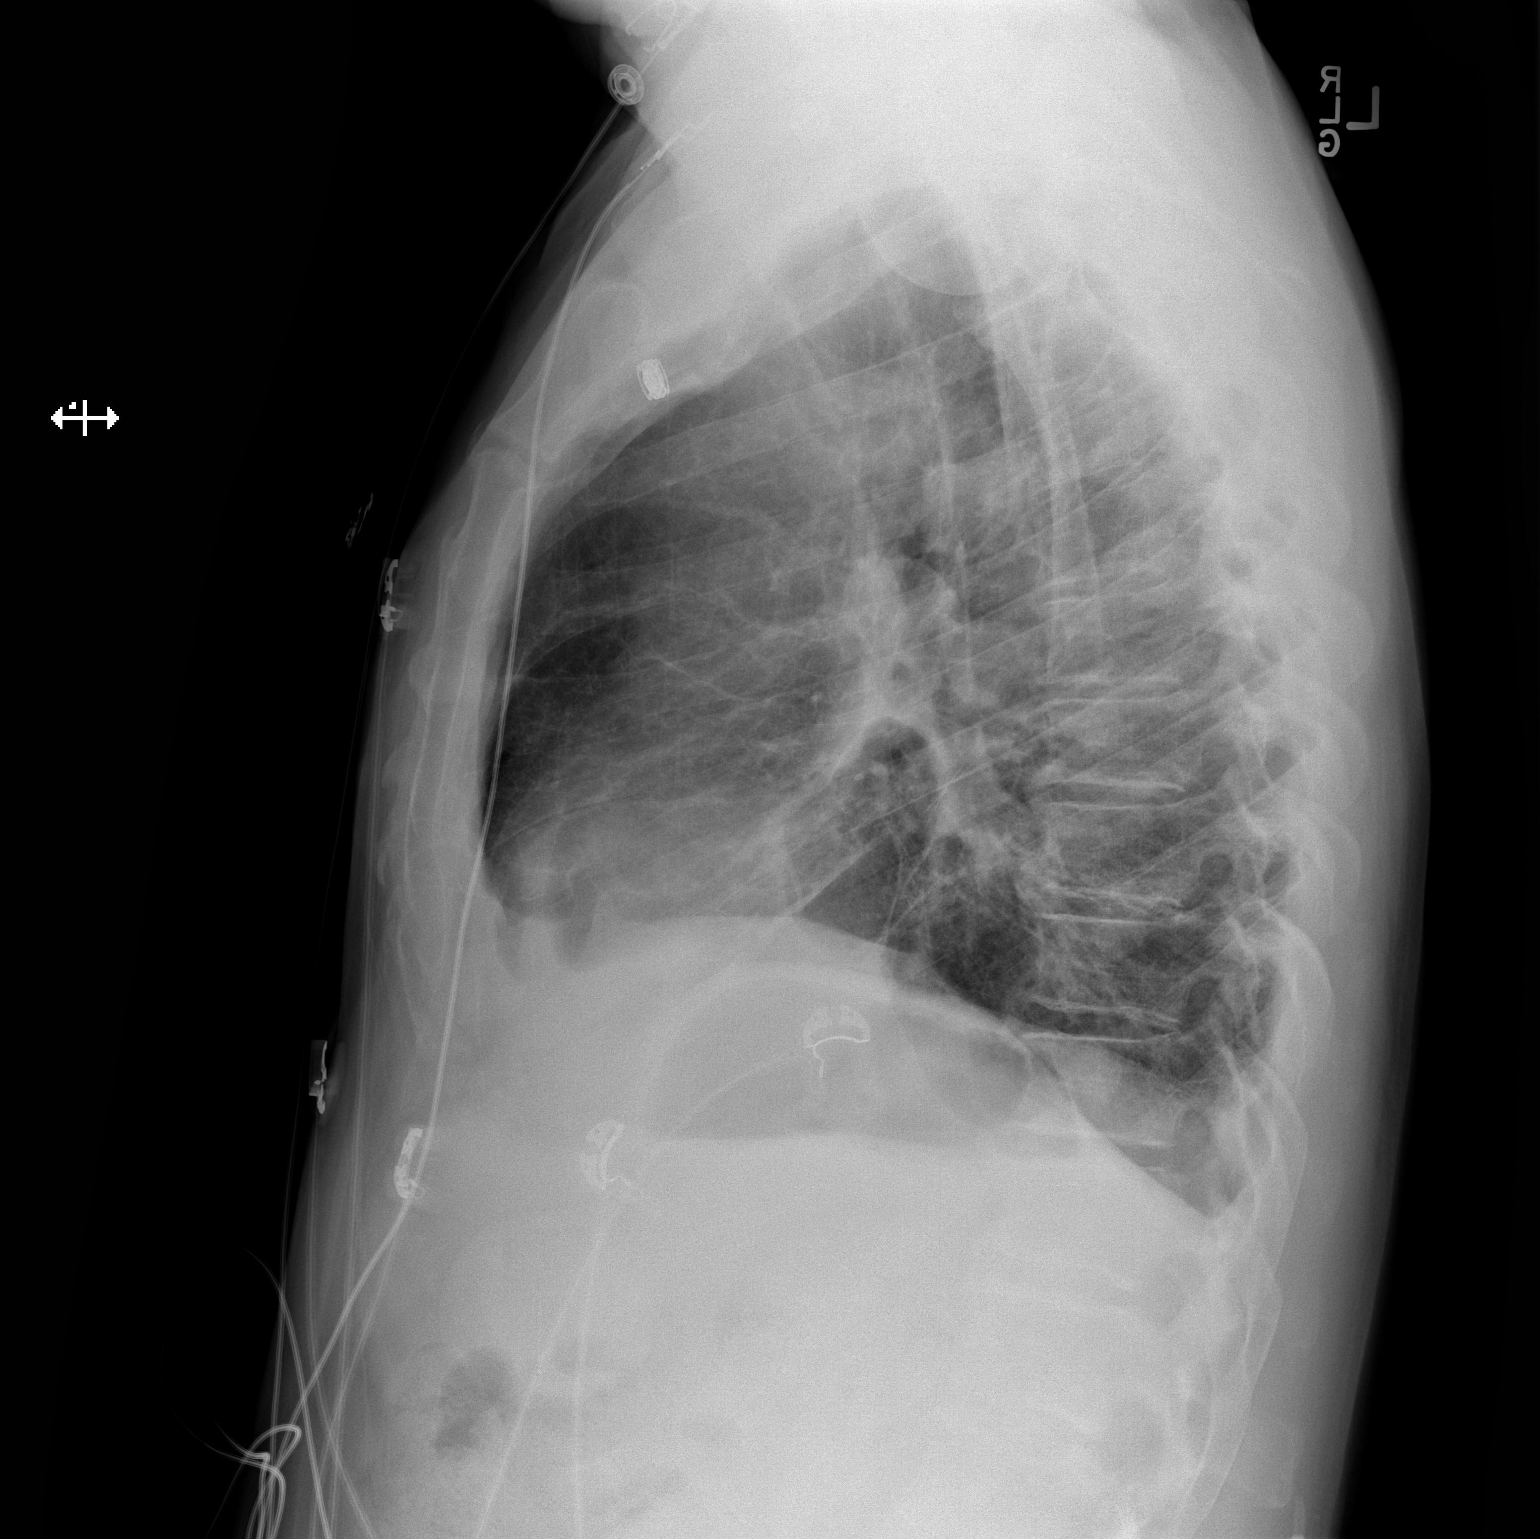

[2 of 2 positions shown; findings below may reference images not displayed]

FINDINGS: Emphysematous change at the lung apices. Right suprahilar scarring,
unchanged. Right infrahilar prominence, corresponds to hilar soft
tissue density on CT. The heart is normal in size. No pulmonary
edema, confluent airspace disease, pleural effusion or pneumothorax.
No acute osseous abnormalities are seen.
IMPRESSION: Emphysema and scarring in the right upper lobe. Right infrahilar
prominence corresponding to soft tissue on prior CT. No definite
superimposed acute process.

## 2017-09-11 IMAGING — DX DG ABDOMEN 1V
2 series · 2 of 2 positions shown · non-contrast
Comparison: CT abdomen 09/12/2015

CLINICAL DATA: Abdominal pain with vomiting

EXAM:
ABDOMEN - 1 VIEW

[abdomen kub (1 of 2)]
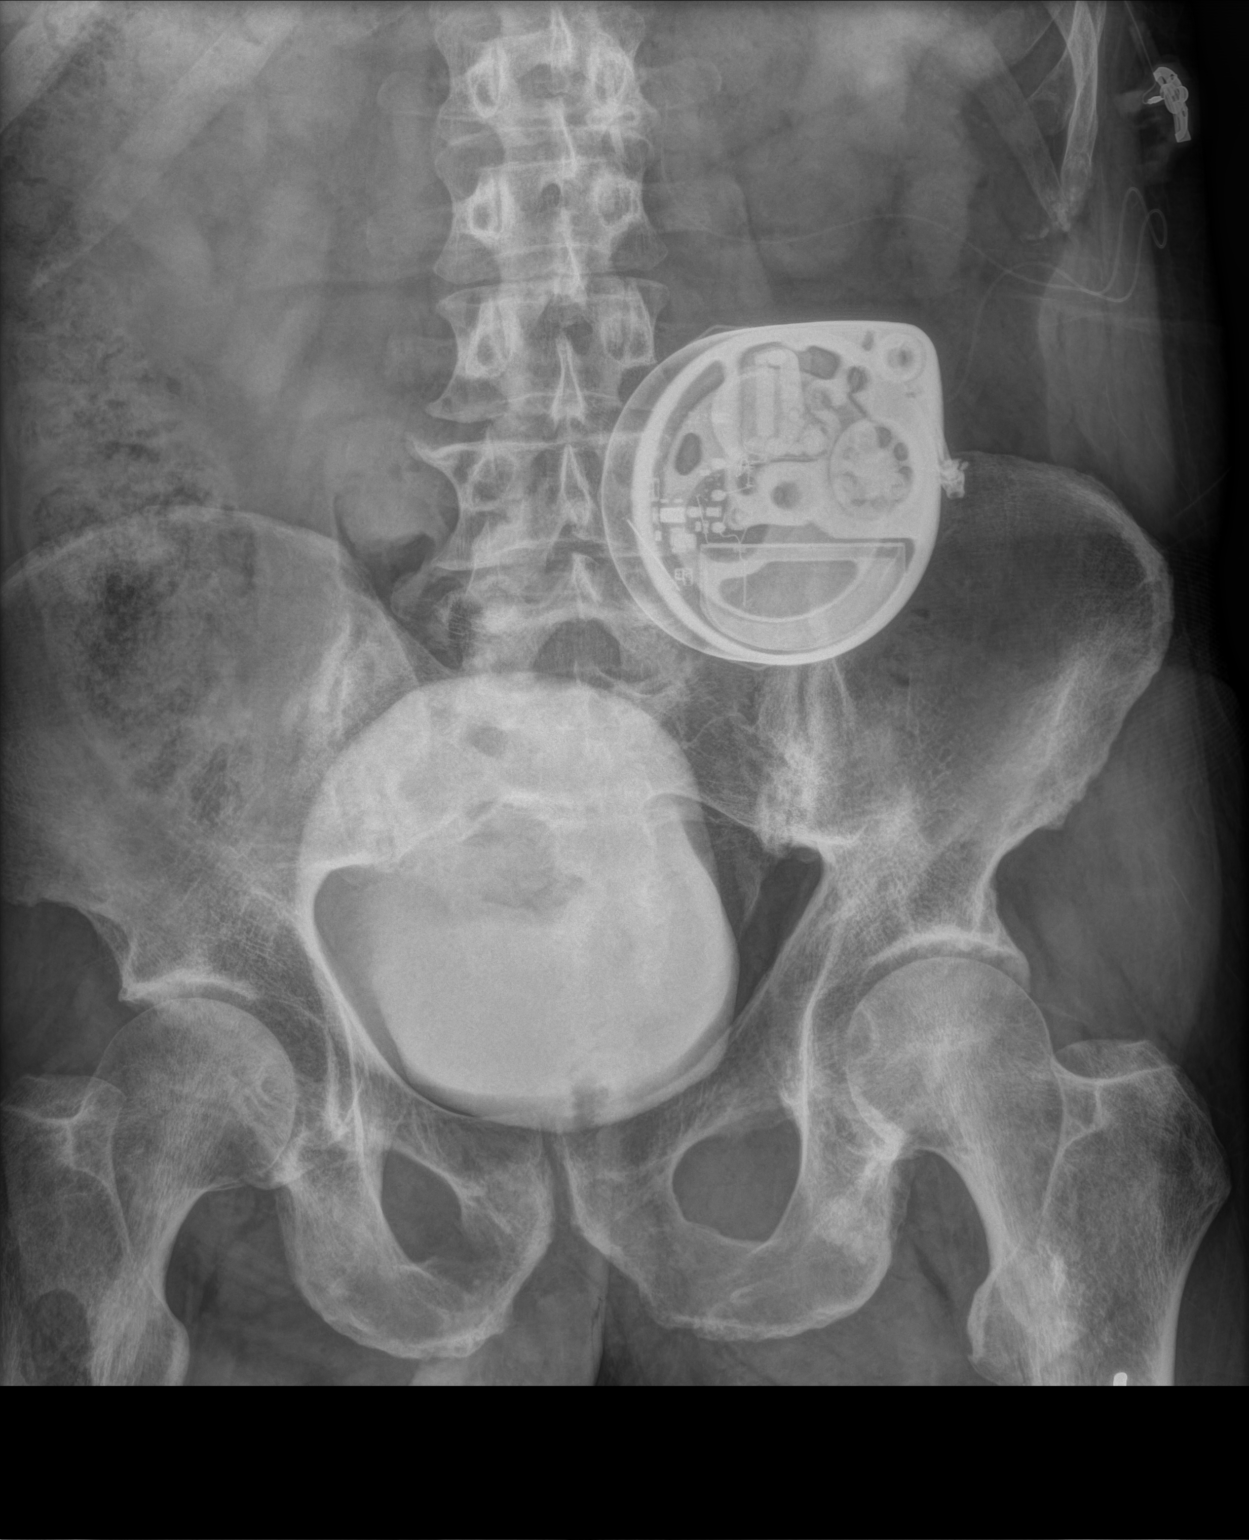

[abdomen kub (2 of 2)]
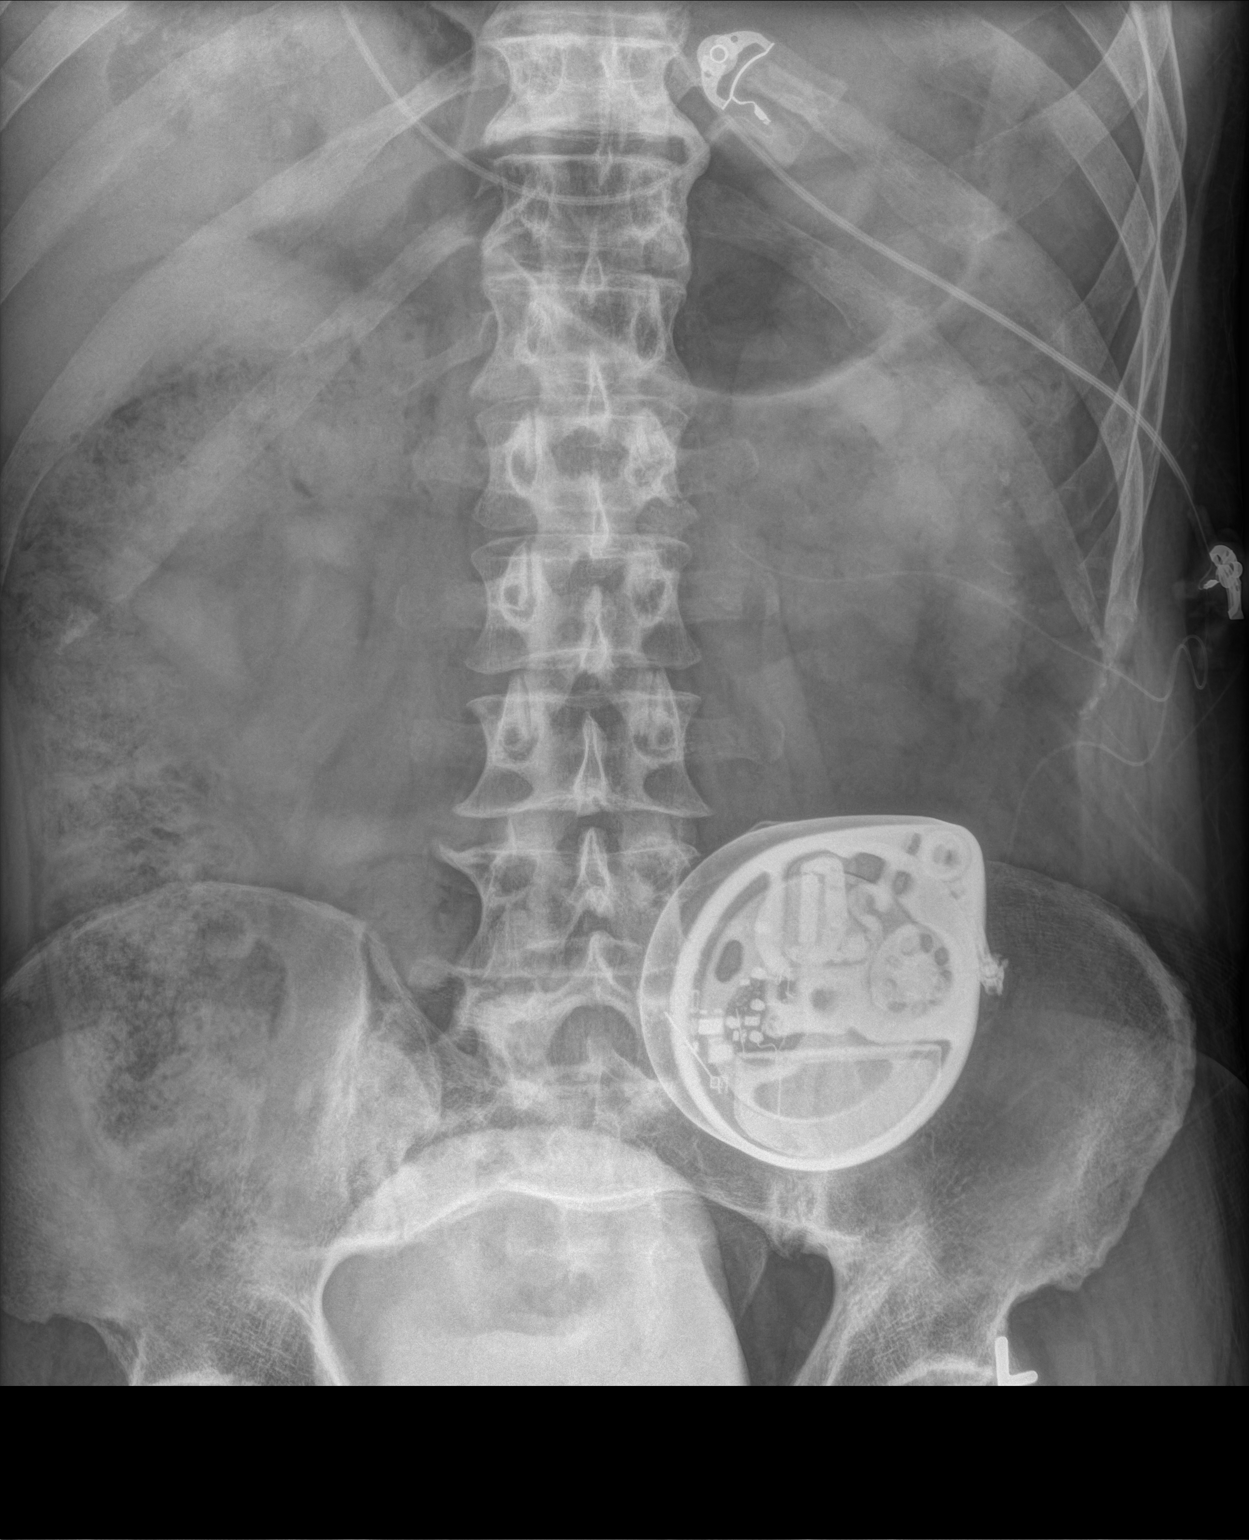

[2 of 2 positions shown; findings below may reference images not displayed]

FINDINGS: Contrast filled urinary bladder from contrast-enhanced CT chest
yesterday. Urinary bladder is normal.

Electronic pain pump noted on the left with catheter extending into
the spinal canal.

Normal bowel gas pattern. Moderate constipation. Mild degenerative
change in both hip joints. No acute bony abnormality. Negative for
urinary tract calculi
IMPRESSION: Constipation

## 2017-09-14 IMAGING — CT CT CHEST W/ CM
2 of 3 series · 15 of 36 positions shown, 18 images · IV contrast (OMNIPAQUE 300)
Comparison: 11/07/2015

CLINICAL DATA: Increased pleuritic chest pain. Metastatic renal
cell carcinoma.

EXAM:
CT CHEST WITH CONTRAST
TECHNIQUE: Multidetector CT imaging of the chest was performed during
intravenous contrast administration.
CONTRAST:  75mL OMNIPAQUE IOHEXOL 300 MG/ML  SOLN

[Series 2: chest with st · axial · 0.84mm/px · z∈[-335,-60]mm · 12 of 65 slices shown, 15 images]
[im 5/65  mediastinal]
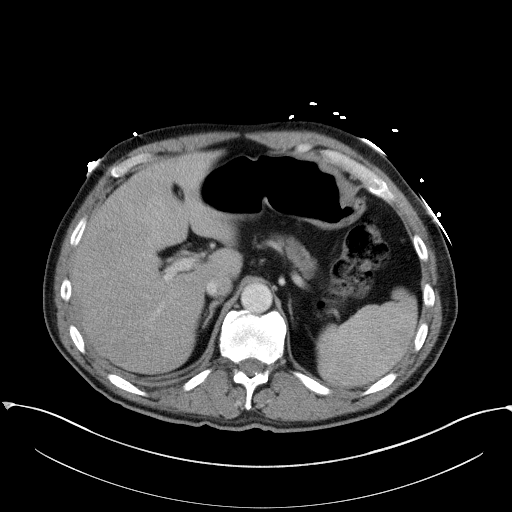
[im 5/65  lung]
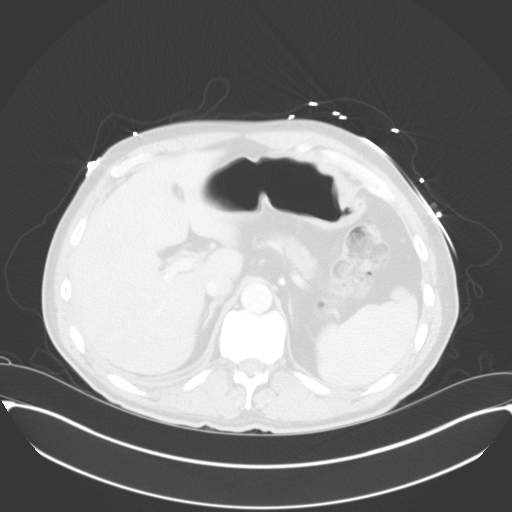
[im 10/65  lung]
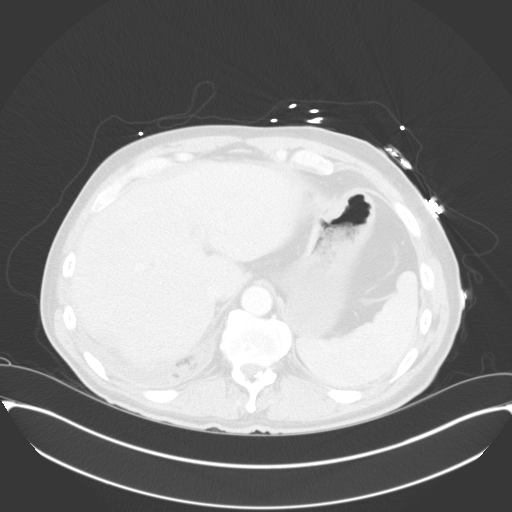
[im 15/65  lung]
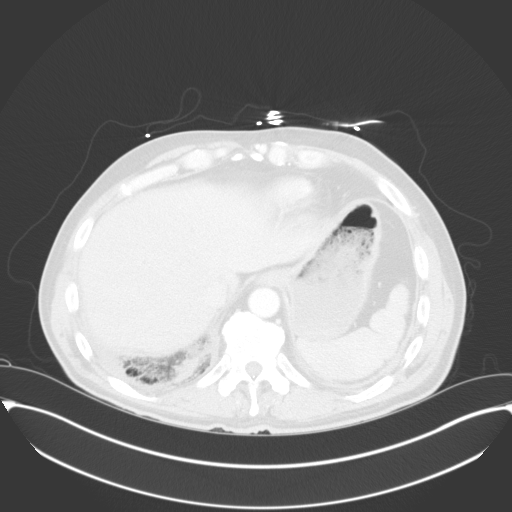
[im 19/65  lung]
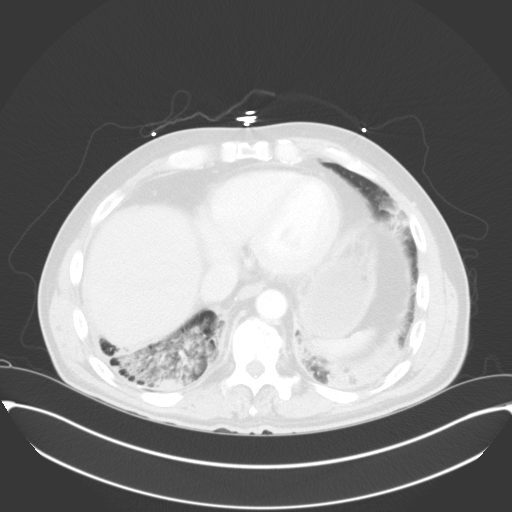
[im 24/65  mediastinal]
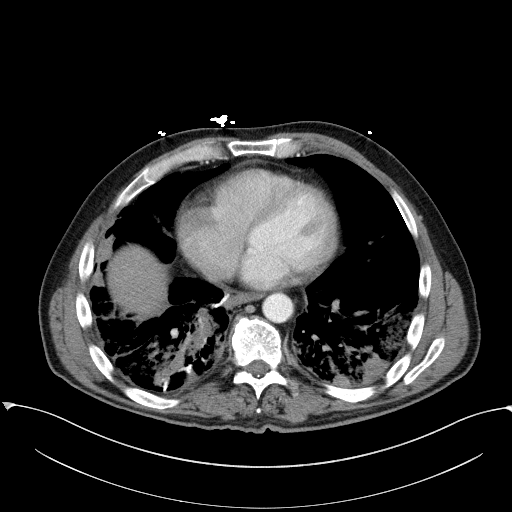
[im 24/65  lung]
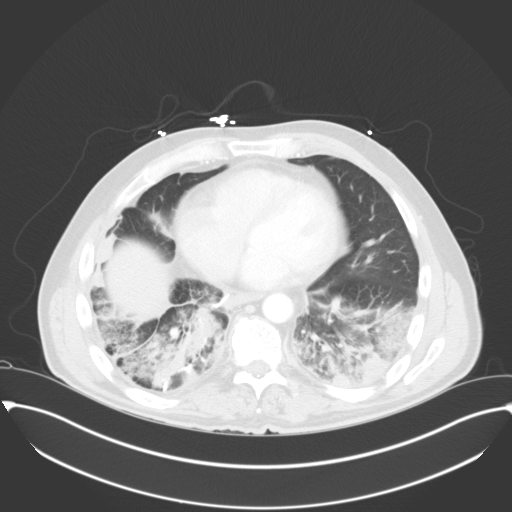
[im 29/65  lung]
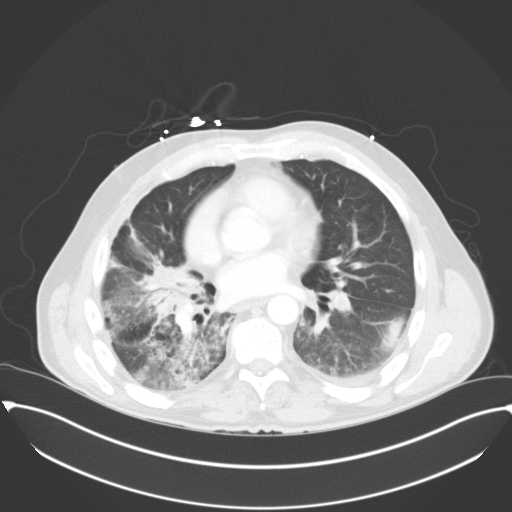
[im 36/65  lung]
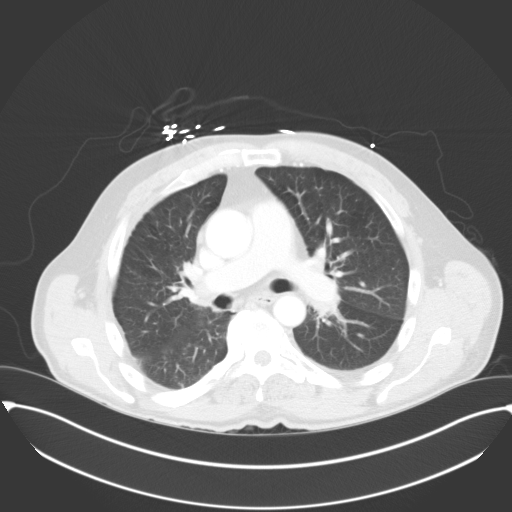
[im 41/65  lung]
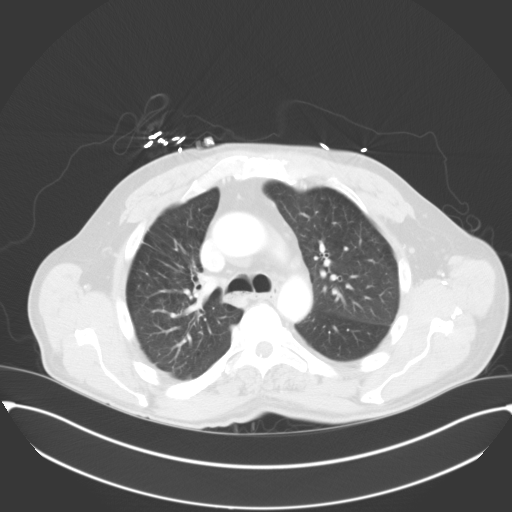
[im 46/65  mediastinal]
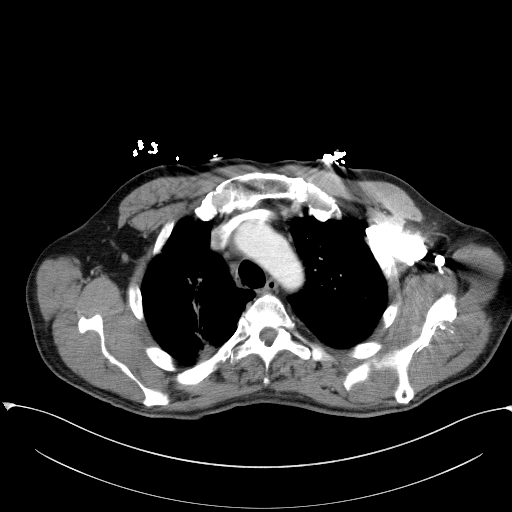
[im 46/65  lung]
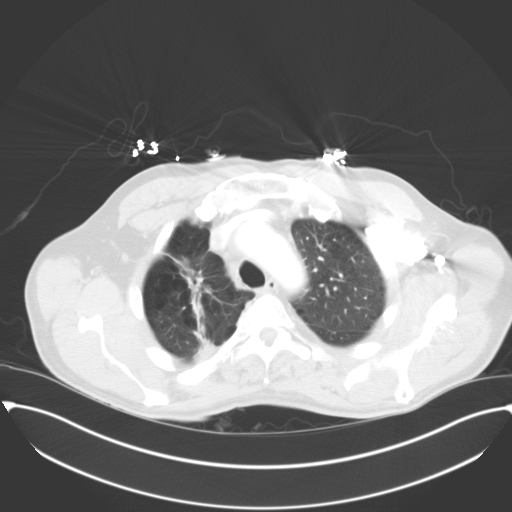
[im 50/65  lung]
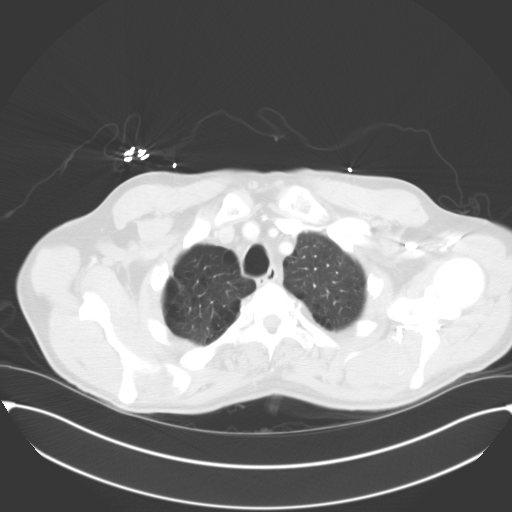
[im 55/65  lung]
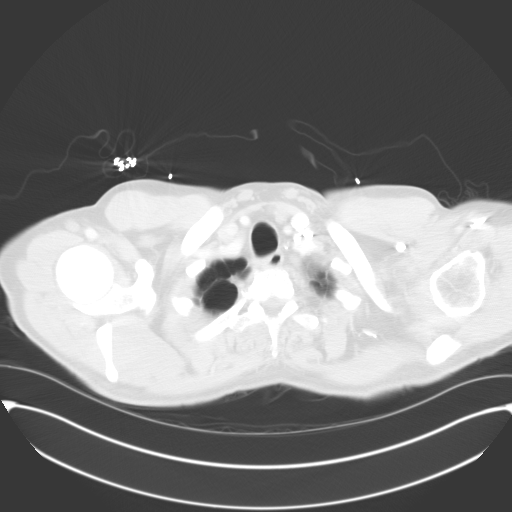
[im 60/65  lung]
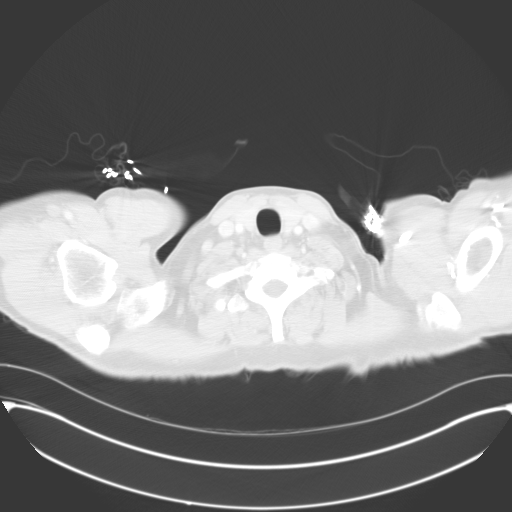

[Series 6: coronals · coronal · 0.62mm/px · 3 of 87 slices shown]
[im 18/87  lung]
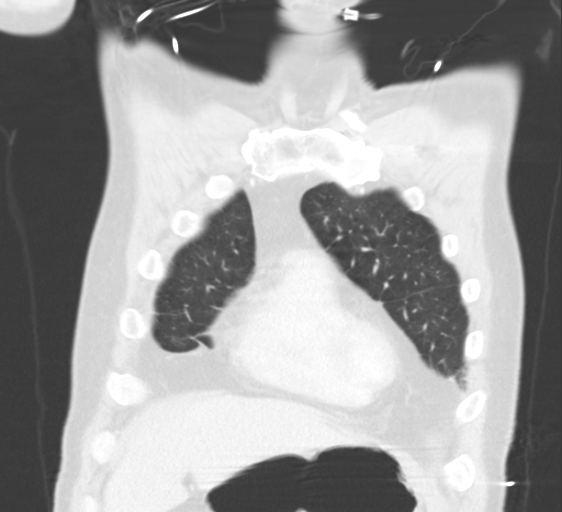
[im 35/87  lung]
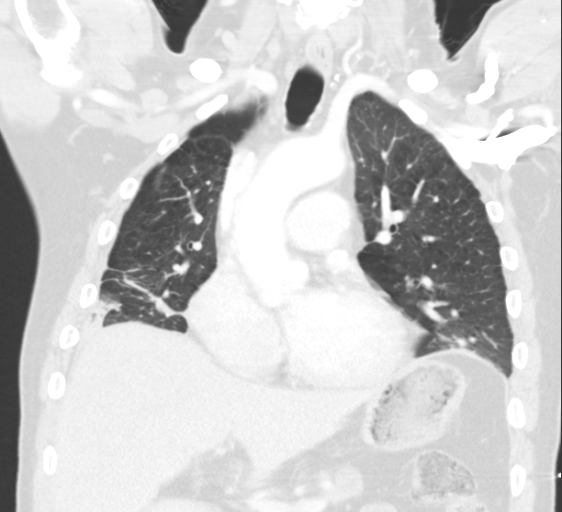
[im 52/87  lung]
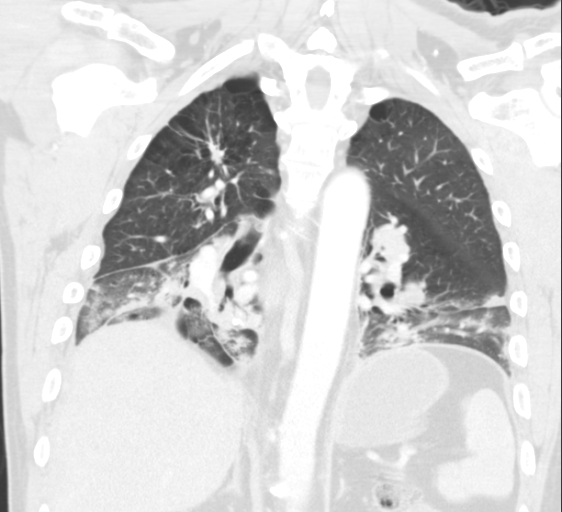

[15 of 36 positions shown; findings below may reference images not displayed]

FINDINGS: Mediastinum/Lymph Nodes: No masses or pathologically enlarged lymph
nodes identified.

Acute pulmonary embolism is seen within central left lower lobe
pulmonary artery. No evidence of thoracic aortic aneurysm or
dissection. Heart size is normal. No evidence of pericardial
effusion.

RV/LV ratio is 0.7, within normal limits.

Lungs/Pleura: New bilateral lower lobe and right middle lobe
airspace disease, suspicious for pneumonia. Fluid level seen in
right mainstem bronchus raises suspicion for aspiration pneumonia.
Mild emphysema again noted.

1.9 cm central right upper lobe nodule on image 21 remains stable.
2.2 cm right perihilar nodular opacity on image 36 is also
unchanged. No evidence of pleural or pericardial effusion.

Musculoskeletal/Soft Tissues: No suspicious bone lesions or other
significant chest wall abnormality.

Upper Abdomen: Gallbladder is incompletely visualized but shows high
attenuation intraluminal sludge.
IMPRESSION: Acute pulmonary embolism in central left lower lobe pulmonary
artery. No CT evidence of right heart strain.

New bilateral lower lobe and right middle lobe airspace disease,
suspicious for pneumonia. Fluid in right mainstem bronchus raises
suspicion for aspiration pneumonia.

Stable right upper lobe and perihilar nodules, which are now
partially obscured by airspace disease described above. Continued
followup by CT recommended.

Critical Value/emergent results were called by telephone at the time
of interpretation on 11/11/2015 at [DATE] to Dr. Ben Ghalba in the ED, who
verbally acknowledged these results.

## 2017-09-21 ENCOUNTER — Other Ambulatory Visit: Payer: Self-pay | Admitting: Oncology

## 2017-09-25 ENCOUNTER — Other Ambulatory Visit (HOSPITAL_BASED_OUTPATIENT_CLINIC_OR_DEPARTMENT_OTHER): Payer: Medicare Other

## 2017-09-25 ENCOUNTER — Ambulatory Visit (HOSPITAL_BASED_OUTPATIENT_CLINIC_OR_DEPARTMENT_OTHER): Payer: Medicare Other | Admitting: Oncology

## 2017-09-25 DIAGNOSIS — K611 Rectal abscess: Secondary | ICD-10-CM

## 2017-09-25 DIAGNOSIS — G8929 Other chronic pain: Secondary | ICD-10-CM

## 2017-09-25 DIAGNOSIS — K8689 Other specified diseases of pancreas: Secondary | ICD-10-CM

## 2017-09-25 DIAGNOSIS — I10 Essential (primary) hypertension: Secondary | ICD-10-CM | POA: Diagnosis not present

## 2017-09-25 DIAGNOSIS — K869 Disease of pancreas, unspecified: Secondary | ICD-10-CM

## 2017-09-25 DIAGNOSIS — R2242 Localized swelling, mass and lump, left lower limb: Secondary | ICD-10-CM | POA: Diagnosis not present

## 2017-09-25 DIAGNOSIS — R197 Diarrhea, unspecified: Secondary | ICD-10-CM

## 2017-09-25 DIAGNOSIS — F419 Anxiety disorder, unspecified: Secondary | ICD-10-CM

## 2017-09-25 DIAGNOSIS — C7889 Secondary malignant neoplasm of other digestive organs: Secondary | ICD-10-CM

## 2017-09-25 DIAGNOSIS — I2699 Other pulmonary embolism without acute cor pulmonale: Secondary | ICD-10-CM | POA: Diagnosis not present

## 2017-09-25 DIAGNOSIS — C649 Malignant neoplasm of unspecified kidney, except renal pelvis: Secondary | ICD-10-CM

## 2017-09-25 DIAGNOSIS — R634 Abnormal weight loss: Secondary | ICD-10-CM

## 2017-09-25 DIAGNOSIS — C7951 Secondary malignant neoplasm of bone: Secondary | ICD-10-CM | POA: Diagnosis not present

## 2017-09-25 LAB — CBC WITH DIFFERENTIAL/PLATELET
BASO%: 0.8 % (ref 0.0–2.0)
Basophils Absolute: 0 10*3/uL (ref 0.0–0.1)
EOS%: 2.1 % (ref 0.0–7.0)
Eosinophils Absolute: 0.1 10*3/uL (ref 0.0–0.5)
HCT: 43.2 % (ref 38.4–49.9)
HGB: 14.2 g/dL (ref 13.0–17.1)
LYMPH%: 30.8 % (ref 14.0–49.0)
MCH: 32.6 pg (ref 27.2–33.4)
MCHC: 32.7 g/dL (ref 32.0–36.0)
MCV: 99.7 fL — ABNORMAL HIGH (ref 79.3–98.0)
MONO#: 0.3 10*3/uL (ref 0.1–0.9)
MONO%: 5.3 % (ref 0.0–14.0)
NEUT#: 3.5 10*3/uL (ref 1.5–6.5)
NEUT%: 61 % (ref 39.0–75.0)
Platelets: 136 10*3/uL — ABNORMAL LOW (ref 140–400)
RBC: 4.34 10*6/uL (ref 4.20–5.82)
RDW: 16.7 % — ABNORMAL HIGH (ref 11.0–14.6)
WBC: 5.7 10*3/uL (ref 4.0–10.3)
lymph#: 1.8 10*3/uL (ref 0.9–3.3)

## 2017-09-25 LAB — COMPREHENSIVE METABOLIC PANEL
ALT: 23 U/L (ref 0–55)
AST: 17 U/L (ref 5–34)
Albumin: 3.2 g/dL — ABNORMAL LOW (ref 3.5–5.0)
Alkaline Phosphatase: 85 U/L (ref 40–150)
Anion Gap: 7 mEq/L (ref 3–11)
BUN: 10.8 mg/dL (ref 7.0–26.0)
CO2: 31 mEq/L — ABNORMAL HIGH (ref 22–29)
Calcium: 9.1 mg/dL (ref 8.4–10.4)
Chloride: 102 mEq/L (ref 98–109)
Creatinine: 1 mg/dL (ref 0.7–1.3)
EGFR: 85 mL/min/{1.73_m2} — ABNORMAL LOW (ref >90)
Glucose: 117 mg/dl (ref 70–140)
Potassium: 4.6 mEq/L (ref 3.5–5.1)
Sodium: 140 mEq/L (ref 136–145)
Total Bilirubin: <0.22 mg/dL (ref 0.20–1.20)
Total Protein: 6.9 g/dL (ref 6.4–8.3)

## 2017-09-25 MED ORDER — ALPRAZOLAM 1 MG PO TABS: ORAL_TABLET | ORAL | 0 refills | Status: DC

## 2017-09-25 NOTE — Progress Notes (Signed)
Hematology and Oncology Follow Up Visit  Diquan Kassis [id number] [birth year] 51 y.o. 09/25/2017 4:17 PM  [name], MD  [name], M.D.  [name], MD  [name], MD    Principle Diagnosis: 51 year old gentleman with stage IV renal cell carcinoma diagnosed in 2009.  Prior Therapy: 1. Status post laparoscopic radical nephrectomy.  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  2. Patient status post thoracotomy for a synchronous metastatic lung lesions done October 2009.  He had a lower lobe nodule, biopsy proven to be metastatic renal cell carcinoma.   3. Patient is status post stereotactic radiotherapy to pulmonary nodules in May of 2010. 4. He is S/P Sutent 50 mg 4 weeks on 2 weeks off from 10/2010 to 03/2013. He progressed at that time.  5. He is S/P radiation to the right sacral bone between 4/22 to 4/30.  6. He is S/P XRT to the left shoulder 03/20/14 to 03/31/14. 7. Votrient 800 mg by mouth daily from 03/2013 through 06/22/2015. Discontinued secondary to disease progression. 8. Nivolumab 3 mg/kg given every 2 weeks started on 06/29/2015. He is status post 4 cycles completed 08/10/2015. He developed disease progression in September 2016.  9. Status post radiation therapy to the left mid fibula completed on 11/14/2015. He received a grade 1 fraction.    Current therapy:  Cabometyx 60 mg daily started in November 2016.   Interim History:  Mr. Stencil presents today for a followup visit. Since his last visit, he reports feeling better on the last few days after developing flulike symptoms for a few weeks. He lost a lot of weight but is regaining it back at this time.He continues to take Cabometyx at the 60 mg dose without any other complications. His diarrhea is manageable usually with Imodium and Lomotil. He reports his appetite has improved significantly last few days. He denied any hand-foot syndrome, fatigue or decline in his quality of life  or performance status. He has no difficulties obtaining this medication or taking it regularly.  He does not report any changes in his chronic pain and continues to follow at the pain clinic. No recent exacerbations or hospitalizations.  He did not report any headaches blurred vision or double vision. Does not report any seizure activity or alteration of mental status. Does not report any abdominal pain or hematochezia. Does not report any hematuria but does report occasional hesitancy and nocturia. He does not report any rashes or lesions or petechiae. He does not report any lymphadenopathy.  Remainder of his review of system is unremarkable.  Medications: I have reviewed the patient's current medications.  Current Outpatient Prescriptions  Medication Sig Dispense Refill  . ALPRAZolam (XANAX) 1 MG tablet TAKE 1 TABLET THREE TIMES A DAY AS NEEDED FOR ANXIETY 90 tablet 0  . CABOMETYX 60 MG TABS TAKE 1 TABLET BY MOUTH ONE TIME DAILY WITH AT LEAST 8 OZ OF WATER ON AN EMPTY STOMACH. DO NOT EAT FOR 2 HOURS BEFORE OR 1 HOUR AFTER. SWALLO 30 tablet 0  . calcium carbonate (TUMS - DOSED IN MG ELEMENTAL CALCIUM) 500 MG chewable tablet Chew 1 tablet by mouth as needed for indigestion or heartburn.    . dextromethorphan (ROBITUSSIN MAXIMUM STRENGTH) 15 MG/5ML syrup Take 10 mLs (30 mg total) by mouth 4 (four) times daily as needed for cough. 120 mL 0  . diphenhydrAMINE (BENADRYL) 25 mg capsule Take 25 mg by mouth every 6 (six) hours as needed for itching.    [name]  diphenoxylate-atropine (LOMOTIL) 2.5-0.025 MG tablet Take 1 tablet by mouth 4 (four) times daily as needed for diarrhea or loose stools. 60 tablet 0  . DULoxetine (CYMBALTA) 60 MG capsule TAKE 1 CAPSULE (60 MG TOTAL) BY MOUTH 2 (TWO) TIMES DAILY. 180 capsule 0  . enoxaparin (LOVENOX) 150 MG/ML injection INJECT 0.93 MLS (140 MG TOTAL) INTO THE SKIN DAILY. 30 Syringe 1  . feeding supplement, ENSURE ENLIVE, (ENSURE ENLIVE) LIQD Take 237 mLs by mouth 2 (two)  times daily between meals. 237 mL 12  . gabapentin (NEURONTIN) 300 MG capsule TAKE 1 CAPSULE (300 MG TOTAL) BY MOUTH 3 (THREE) TIMES DAILY. 270 capsule 0  . HYDROmorphone (DILAUDID) 8 MG tablet Take 1 tablet (8 mg total) by mouth every 4 (four) hours as needed for moderate pain or severe pain. 50 tablet 0  . ibuprofen (ADVIL,MOTRIN) 200 MG tablet Take 400 mg by mouth every 6 (six) hours as needed for fever, headache, moderate pain or cramping.    . insulin glargine (LANTUS) 100 UNIT/ML injection Inject 26 Units into the skin at bedtime.    [name] lactulose (CHRONULAC) 10 GM/15ML solution Take 30 mLs (20 g total) by mouth 2 (two) times daily. 240 mL 0  . metoprolol tartrate (LOPRESSOR) 25 MG tablet Take 1 tablet (25 mg total) by mouth 2 (two) times daily. 60 tablet 0  . quiNINE (QUALAQUIN) 324 MG capsule Take 2 capsules (648 mg total) by mouth 3 (three) times daily. 30 capsule 0  . senna-docusate (SENOKOT-S) 8.6-50 MG tablet Take 1 tablet by mouth 2 (two) times daily. 30 tablet 0  . tiZANidine (ZANAFLEX) 4 MG tablet Take 4 mg by mouth every 8 (eight) hours as needed for muscle spasms.   0  . VIAGRA 100 MG tablet Take 50 mg by mouth as needed for erectile dysfunction.      No current facility-administered medications for this visit.      Allergies:  Allergies  Allergen Reactions  . Ceftriaxone Hives  . Hydrocodone Swelling    Past Medical History, Surgical history, Social history, and Family History were reviewed and updated.   Physical Exam: Blood pressure 127/81, pulse 80, temperature 98.3 F (36.8 C), temperature source Oral, resp. rate 19, height 6\' 3"  (1.905 m), weight 174 lb (78.9 kg), SpO2 100 %. ECOG: 1 General appearance: Well-appearing gentleman without distress. Head: No oral thrush or ulcers. Neck: no adenopathy, no masses.  Lymph nodes: Cervical, supraclavicular, and axillary nodes normal. Heart:regular rate and rhythm, S1, S2.  Lung: Clear throughout auscultation. No wheezes  noted. Abdomen: Soft, nontender without rebound or guarding. No rebound or guarding. EXT:  Slight protrusion noted on his left lower extremity unchanged. Neuro: No focal deficits noted.   Lab Results: Lab Results  Component Value Date   WBC 5.7 09/25/2017   HGB 14.2 09/25/2017   HCT 43.2 09/25/2017   MCV 99.7 (H) 09/25/2017   PLT 136 (L) 09/25/2017    Impression and Plan:  51 year old gentleman with the following issues.  1. Metastatic renal cell carcinoma.  He has documented disease to the lung and the pancreas. He is status post multiple therapies. He is currently on Cabometyx and have tolerated it well. CT scan on August 2018 did not show any major changes. The plan is to continue with the current dose and schedule and consider dose reductions in the future if he develops any worsening side effects. We will repeat imaging studies in 2019. 2. Depression/Anxiety: He remains on Xanax and Cymbalta.Xanax was refilled  today. 3. Weight loss: Appetite is improving and feels that he is eating better at this time. 4. Left lower extremity pain: Not dramatically changed and continues to be controlled. 5. Hypertension: Controlled without antihypertensive medication. 6. Pulmonary embolism: He is currently on Lovenox without any recent complications of bleeding or thrombosis. No issues reported. 7. Prognosis: He does have an incurable malignancy however he continues to have excellent performance status and aggressive therapy is warranted. 8. Diarrhea: Continues to be manageable. We will consider dose adjustment in the future diarrhea becomes an issue. 9. Follow up: in 4 to 6  weeks.   Zola Button, MD 09/25/17

## 2017-09-28 ENCOUNTER — Telehealth: Payer: Self-pay | Admitting: Oncology

## 2017-09-28 NOTE — Telephone Encounter (Signed)
Spoke with patient regarding appts that were scheduled per 10/5 los.

## 2017-10-06 ENCOUNTER — Telehealth: Payer: Self-pay | Admitting: Oncology

## 2017-10-06 NOTE — Telephone Encounter (Signed)
10/06/17 prescription refill

## 2017-10-09 ENCOUNTER — Encounter: Payer: Self-pay | Admitting: *Deleted

## 2017-10-10 ENCOUNTER — Other Ambulatory Visit: Payer: Self-pay | Admitting: Oncology

## 2017-10-10 DIAGNOSIS — C649 Malignant neoplasm of unspecified kidney, except renal pelvis: Secondary | ICD-10-CM

## 2017-10-12 ENCOUNTER — Other Ambulatory Visit: Payer: Self-pay | Admitting: Oncology

## 2017-10-14 DIAGNOSIS — M25551 Pain in right hip: Secondary | ICD-10-CM | POA: Diagnosis not present

## 2017-10-14 DIAGNOSIS — M5417 Radiculopathy, lumbosacral region: Secondary | ICD-10-CM | POA: Diagnosis not present

## 2017-10-14 DIAGNOSIS — G629 Polyneuropathy, unspecified: Secondary | ICD-10-CM | POA: Diagnosis not present

## 2017-10-14 DIAGNOSIS — R030 Elevated blood-pressure reading, without diagnosis of hypertension: Secondary | ICD-10-CM | POA: Diagnosis not present

## 2017-10-14 DIAGNOSIS — Z9689 Presence of other specified functional implants: Secondary | ICD-10-CM | POA: Diagnosis not present

## 2017-10-29 ENCOUNTER — Encounter: Payer: Self-pay | Admitting: *Deleted

## 2017-10-29 ENCOUNTER — Telehealth: Payer: Self-pay | Admitting: *Deleted

## 2017-10-29 NOTE — Telephone Encounter (Signed)
Caller states Keith [name] does not live here, when calling home #

## 2017-11-02 ENCOUNTER — Other Ambulatory Visit: Payer: Self-pay | Admitting: *Deleted

## 2017-11-02 DIAGNOSIS — C7951 Secondary malignant neoplasm of bone: Secondary | ICD-10-CM

## 2017-11-02 DIAGNOSIS — C649 Malignant neoplasm of unspecified kidney, except renal pelvis: Secondary | ICD-10-CM

## 2017-11-02 DIAGNOSIS — K611 Rectal abscess: Secondary | ICD-10-CM

## 2017-11-02 DIAGNOSIS — F419 Anxiety disorder, unspecified: Secondary | ICD-10-CM

## 2017-11-02 DIAGNOSIS — K8689 Other specified diseases of pancreas: Secondary | ICD-10-CM

## 2017-11-02 MED ORDER — ALPRAZOLAM 1 MG PO TABS: ORAL_TABLET | ORAL | 0 refills | Status: DC

## 2017-11-03 ENCOUNTER — Other Ambulatory Visit (HOSPITAL_BASED_OUTPATIENT_CLINIC_OR_DEPARTMENT_OTHER): Payer: Medicare Other

## 2017-11-03 ENCOUNTER — Ambulatory Visit (HOSPITAL_BASED_OUTPATIENT_CLINIC_OR_DEPARTMENT_OTHER): Payer: Medicare Other | Admitting: Oncology

## 2017-11-03 VITALS — BP 142/100 | HR 83 | Temp 98.1°F | Resp 18 | Ht 75.0 in | Wt 181.8 lb

## 2017-11-03 DIAGNOSIS — Z7901 Long term (current) use of anticoagulants: Secondary | ICD-10-CM

## 2017-11-03 DIAGNOSIS — C649 Malignant neoplasm of unspecified kidney, except renal pelvis: Secondary | ICD-10-CM

## 2017-11-03 DIAGNOSIS — C7889 Secondary malignant neoplasm of other digestive organs: Secondary | ICD-10-CM

## 2017-11-03 DIAGNOSIS — I10 Essential (primary) hypertension: Secondary | ICD-10-CM | POA: Diagnosis not present

## 2017-11-03 DIAGNOSIS — I2699 Other pulmonary embolism without acute cor pulmonale: Secondary | ICD-10-CM | POA: Diagnosis not present

## 2017-11-03 DIAGNOSIS — C61 Malignant neoplasm of prostate: Secondary | ICD-10-CM

## 2017-11-03 DIAGNOSIS — R197 Diarrhea, unspecified: Secondary | ICD-10-CM

## 2017-11-03 DIAGNOSIS — C7951 Secondary malignant neoplasm of bone: Secondary | ICD-10-CM

## 2017-11-03 DIAGNOSIS — K611 Rectal abscess: Secondary | ICD-10-CM

## 2017-11-03 DIAGNOSIS — F418 Other specified anxiety disorders: Secondary | ICD-10-CM

## 2017-11-03 DIAGNOSIS — K8689 Other specified diseases of pancreas: Secondary | ICD-10-CM

## 2017-11-03 DIAGNOSIS — F419 Anxiety disorder, unspecified: Secondary | ICD-10-CM

## 2017-11-03 LAB — CBC WITH DIFFERENTIAL/PLATELET
BASO%: 0.4 % (ref 0.0–2.0)
Basophils Absolute: 0 10*3/uL (ref 0.0–0.1)
EOS%: 2.1 % (ref 0.0–7.0)
Eosinophils Absolute: 0.1 10*3/uL (ref 0.0–0.5)
HCT: 43.2 % (ref 38.4–49.9)
HGB: 13.6 g/dL (ref 13.0–17.1)
LYMPH%: 30.4 % (ref 14.0–49.0)
MCH: 32.9 pg (ref 27.2–33.4)
MCHC: 31.5 g/dL — ABNORMAL LOW (ref 32.0–36.0)
MCV: 104.6 fL — ABNORMAL HIGH (ref 79.3–98.0)
MONO#: 0.2 10*3/uL (ref 0.1–0.9)
MONO%: 4.4 % (ref 0.0–14.0)
NEUT#: 3.3 10*3/uL (ref 1.5–6.5)
NEUT%: 62.7 % (ref 39.0–75.0)
Platelets: 141 10*3/uL (ref 140–400)
RBC: 4.13 10*6/uL — ABNORMAL LOW (ref 4.20–5.82)
RDW: 15.9 % — ABNORMAL HIGH (ref 11.0–14.6)
WBC: 5.2 10*3/uL (ref 4.0–10.3)
lymph#: 1.6 10*3/uL (ref 0.9–3.3)

## 2017-11-03 LAB — COMPREHENSIVE METABOLIC PANEL
ALT: 26 U/L (ref 0–55)
AST: 21 U/L (ref 5–34)
Albumin: 3.3 g/dL — ABNORMAL LOW (ref 3.5–5.0)
Alkaline Phosphatase: 81 U/L (ref 40–150)
Anion Gap: 7 mEq/L (ref 3–11)
BUN: 10.2 mg/dL (ref 7.0–26.0)
CO2: 28 mEq/L (ref 22–29)
Calcium: 8.8 mg/dL (ref 8.4–10.4)
Chloride: 104 mEq/L (ref 98–109)
Creatinine: 1 mg/dL (ref 0.7–1.3)
EGFR: >60 mL/min/{1.73_m2} (ref >60)
Glucose: 87 mg/dl (ref 70–140)
Potassium: 4.2 mEq/L (ref 3.5–5.1)
Sodium: 139 mEq/L (ref 136–145)
Total Bilirubin: 0.24 mg/dL (ref 0.20–1.20)
Total Protein: 7.1 g/dL (ref 6.4–8.3)

## 2017-11-03 MED ORDER — RIVAROXABAN 20 MG PO TABS: 20.0000 mg | ORAL_TABLET | Freq: Every day | ORAL | 6 refills | Status: DC

## 2017-11-03 NOTE — Progress Notes (Signed)
Hematology and Oncology Follow Up Visit  Keith [name] [id number] 29-Jun-1966 51 y.o. 11/03/2017 2:35 PM  [name], MD  [name], M.D.  [name], MD  [name], MD    Principle Diagnosis: 51 year old gentleman with stage IV renal cell carcinoma diagnosed in 2009.  Prior Therapy: 1. Status post laparoscopic radical nephrectomy.  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  2. Patient status post thoracotomy for a synchronous metastatic lung lesions done October 2009.  He had a lower lobe nodule, biopsy proven to be metastatic renal cell carcinoma.   3. Patient is status post stereotactic radiotherapy to pulmonary nodules in May of 2010. 4. He is S/P Sutent 50 mg 4 weeks on 2 weeks off from 10/2010 to 03/2013. He progressed at that time.  5. He is S/P radiation to the right sacral bone between 4/22 to 4/30.  6. He is S/P XRT to the left shoulder 03/20/14 to 03/31/14. 7. Votrient 800 mg by mouth daily from 03/2013 through 06/22/2015. Discontinued secondary to disease progression. 8. Nivolumab 3 mg/kg given every 2 weeks started on 06/29/2015. He is status post 4 cycles completed 08/10/2015. He developed disease progression in September 2016.  9. Status post radiation therapy to the left mid fibula completed on 11/14/2015. He received a grade 1 fraction.    Current therapy:  Cabometyx 60 mg daily started in November 2016.   Interim History:  Mr. Smigiel presents today for a followup visit. Since his last visit, he reports no major changes in his health.  He is eating well and continues to gain weight.  He does report pain and tenderness around the Lovenox injection sites.[name] He continues to take Cabometyx at the 60 mg dose without any other complications. His diarrhea is manageable usually with Imodium and Lomotil. He reports his appetite has improved significantly continues to gain weight.. He denied any hand-foot syndrome, fatigue or decline in his  quality of life or performance status.  His pain is overall manageable.  He did not report any headaches blurred vision or double vision. Does not report any seizure activity or alteration of mental status. Does not report any abdominal pain or hematochezia. Does not report any hematuria but does report occasional hesitancy and nocturia. He does not report any rashes or lesions or petechiae. He does not report any lymphadenopathy.  Remainder of his review of system is unremarkable.  Medications: I have reviewed the patient's current medications.  Current Outpatient Medications  Medication Sig Dispense Refill  . ALPRAZolam (XANAX) 1 MG tablet TAKE 1 TABLET THREE TIMES A DAY AS NEEDED FOR ANXIETY 90 tablet 0  . CABOMETYX 60 MG TABS TAKE 1 TABLET BY MOUTH ONE TIME DAILY WITH AT LEAST 8 OZ OF WATER ON AN EMPTY STOMACH. DO NOT EAT FOR 2 HOURS BEFORE OR 1 HOUR AFTER. SWALLO 30 tablet 0  . calcium carbonate (TUMS - DOSED IN MG ELEMENTAL CALCIUM) 500 MG chewable tablet Chew 1 tablet by mouth as needed for indigestion or heartburn.    . dextromethorphan (ROBITUSSIN MAXIMUM STRENGTH) 15 MG/5ML syrup Take 10 mLs (30 mg total) by mouth 4 (four) times daily as needed for cough. 120 mL 0  . diphenhydrAMINE (BENADRYL) 25 mg capsule Take 25 mg by mouth every 6 (six) hours as needed for itching.    . diphenoxylate-atropine (LOMOTIL) 2.5-0.025 MG tablet Take 1 tablet by mouth 4 (four) times daily as needed for diarrhea or loose stools. 60 tablet 0  .  DULoxetine (CYMBALTA) 60 MG capsule TAKE 1 CAPSULE (60 MG TOTAL) BY MOUTH 2 (TWO) TIMES DAILY. 180 capsule 0  . enoxaparin (LOVENOX) 150 MG/ML injection INJECT 0.93 MLS (140 MG TOTAL) INTO THE SKIN DAILY. 30 Syringe 1  . feeding supplement, ENSURE ENLIVE, (ENSURE ENLIVE) LIQD Take 237 mLs by mouth 2 (two) times daily between meals. 237 mL 12  . gabapentin (NEURONTIN) 300 MG capsule TAKE 1 CAPSULE (300 MG TOTAL) BY MOUTH 3 (THREE) TIMES DAILY. 270 capsule 0  .  HYDROmorphone (DILAUDID) 8 MG tablet Take 1 tablet (8 mg total) by mouth every 4 (four) hours as needed for moderate pain or severe pain. 50 tablet 0  . ibuprofen (ADVIL,MOTRIN) 200 MG tablet Take 400 mg by mouth every 6 (six) hours as needed for fever, headache, moderate pain or cramping.    . insulin glargine (LANTUS) 100 UNIT/ML injection Inject 26 Units into the skin at bedtime.    [name] lactulose (CHRONULAC) 10 GM/15ML solution Take 30 mLs (20 g total) by mouth 2 (two) times daily. 240 mL 0  . metoprolol tartrate (LOPRESSOR) 25 MG tablet Take 1 tablet (25 mg total) by mouth 2 (two) times daily. 60 tablet 0  . quiNINE (QUALAQUIN) 324 MG capsule Take 2 capsules (648 mg total) by mouth 3 (three) times daily. 30 capsule 0  . rivaroxaban (XARELTO) 20 MG TABS tablet Take 1 tablet (20 mg total) daily with supper by mouth. 90 tablet 6  . senna-docusate (SENOKOT-S) 8.6-50 MG tablet Take 1 tablet by mouth 2 (two) times daily. 30 tablet 0  . tiZANidine (ZANAFLEX) 4 MG tablet Take 4 mg by mouth every 8 (eight) hours as needed for muscle spasms.   0  . VIAGRA 100 MG tablet Take 50 mg by mouth as needed for erectile dysfunction.      No current facility-administered medications for this visit.      Allergies:  Allergies  Allergen Reactions  . Ceftriaxone Hives  . Hydrocodone Swelling    Past Medical History, Surgical history, Social history, and Family History were reviewed and updated.   Physical Exam: Blood pressure (!) 142/100, pulse 83, temperature 98.1 F (36.7 C), temperature source Oral, resp. rate 18, height 6\' 3"  (1.905 m), weight 181 lb 12.8 oz (82.5 kg), SpO2 100 %. ECOG: 1 General appearance: Alert, awake gentleman without distress. Head: No oral thrush or ulcers. Neck: no adenopathy, no masses.  Lymph nodes: Cervical, supraclavicular, and axillary nodes normal. Heart:regular rate and rhythm, S1, S2.  Lung: Clear throughout auscultation. No wheezes noted. Abdomen: Soft, nontender  without rebound or guarding.  Erythema and bruising noted around the injection sites.  No discharge noted. EXT:  Slight protrusion noted on his left lower extremity unchanged. Neuro: No focal deficits noted.   Lab Results: Lab Results  Component Value Date   WBC 5.2 11/03/2017   HGB 13.6 11/03/2017   HCT 43.2 11/03/2017   MCV 104.6 (H) 11/03/2017   PLT 141 11/03/2017    Impression and Plan:  51 year old gentleman with the following issues.  1. Metastatic renal cell carcinoma.  He has documented disease to the lung and the pancreas. He is status post multiple therapies. He is currently on Cabometyx and have tolerated it well. CT scan on August 2018 did not show any major changes. The plan is to continue with the current dose and schedule and consider dose reductions in the future if he develops any worsening side effects. We will repeat imaging studies in February 2019. 2.  Depression/Anxiety: He remains on Xanax and Cymbalta.his mood remains stable. 3. Weight loss: Appetite is improving and weight has improved. 4. Left lower extremity pain: Not dramatically changed and continues to be controlled. 5. Hypertension: Controlled without antihypertensive medication. 6. Pulmonary embolism: He is currently on Lovenox without any recent complications of bleeding or thrombosis.  Risks and benefits of switching to Xarelto was discussed today.  He first to proceed with oral medication at this time and we will switch him to Xarelto instead of Lovenox. 7. Prognosis: He does have an incurable malignancy however he continues to have excellent performance status and aggressive therapy is warranted. 8. Diarrhea: Manageable at this time. 9. Follow up: in 4 to 6  weeks.   Zola Button, MD 11/03/17

## 2017-11-26 ENCOUNTER — Other Ambulatory Visit: Payer: Self-pay | Admitting: *Deleted

## 2017-11-26 ENCOUNTER — Encounter: Payer: Self-pay | Admitting: *Deleted

## 2017-11-26 DIAGNOSIS — C649 Malignant neoplasm of unspecified kidney, except renal pelvis: Secondary | ICD-10-CM

## 2017-11-26 MED ORDER — CABOZANTINIB S-MALATE 60 MG PO TABS: 60.0000 mg | ORAL_TABLET | Freq: Every day | ORAL | 0 refills | Status: DC

## 2017-12-01 ENCOUNTER — Other Ambulatory Visit: Payer: Self-pay | Admitting: *Deleted

## 2017-12-01 DIAGNOSIS — K611 Rectal abscess: Secondary | ICD-10-CM

## 2017-12-01 DIAGNOSIS — K8689 Other specified diseases of pancreas: Secondary | ICD-10-CM

## 2017-12-01 DIAGNOSIS — F419 Anxiety disorder, unspecified: Secondary | ICD-10-CM

## 2017-12-01 DIAGNOSIS — C649 Malignant neoplasm of unspecified kidney, except renal pelvis: Secondary | ICD-10-CM

## 2017-12-01 DIAGNOSIS — C7951 Secondary malignant neoplasm of bone: Secondary | ICD-10-CM

## 2017-12-01 MED ORDER — ALPRAZOLAM 1 MG PO TABS: ORAL_TABLET | ORAL | 0 refills | Status: DC

## 2017-12-02 ENCOUNTER — Other Ambulatory Visit: Payer: Self-pay | Admitting: *Deleted

## 2017-12-02 DIAGNOSIS — K611 Rectal abscess: Secondary | ICD-10-CM

## 2017-12-02 DIAGNOSIS — C649 Malignant neoplasm of unspecified kidney, except renal pelvis: Secondary | ICD-10-CM

## 2017-12-02 DIAGNOSIS — K8689 Other specified diseases of pancreas: Secondary | ICD-10-CM

## 2017-12-02 DIAGNOSIS — F419 Anxiety disorder, unspecified: Secondary | ICD-10-CM

## 2017-12-02 DIAGNOSIS — C7951 Secondary malignant neoplasm of bone: Secondary | ICD-10-CM

## 2017-12-02 IMAGING — CT CT ABD-PEL WO/W CM
2 of 11 series · 12 of 46 positions shown, 17 images · IV contrast (omnipaque)
Comparison: Chest CT 11/11/2015. Most recent abdominal pelvic CT of
09/12/2015.

CLINICAL DATA: Metastatic left-sided renal cell carcinoma diagnosed
in 3229 with left nephrectomy. Bone metastasis. Pancreatic mass.
Lung metastasis with right lung resection. Prior chemotherapy and
radiation therapy. Currently on oral chemotherapy.

EXAM:
CT CHEST AND PELVIS WITH CONTRAST
CT ABDOMEN WITH AND WITHOUT CONTRAST
TECHNIQUE: Multidetector CT imaging of the chest and pelvis were performed
during intravenous contrast administration. Multidetector CT imaging
of the abdomen was performed following the standard protocol before
and during bolus administration of intravenous contrast.
CONTRAST:  100mL OMNIPAQUE IOHEXOL 300 MG/ML  SOLN

[Series 6: venous · axial · portal-venous · 0.87mm/px · z∈[-710,-120]mm · 10 of 233 slices shown, 15 images]
[im 18/233  soft-tissue]
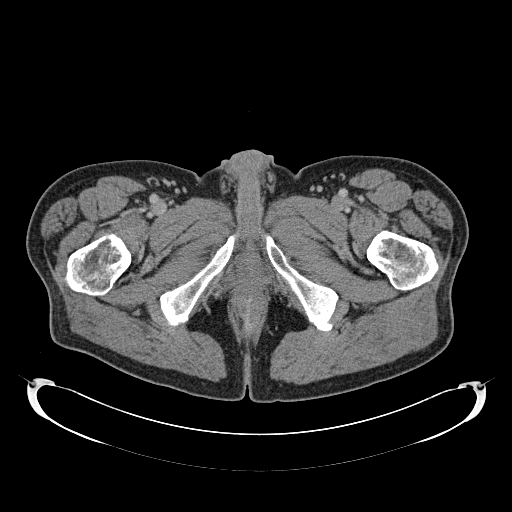
[im 18/233  bone]
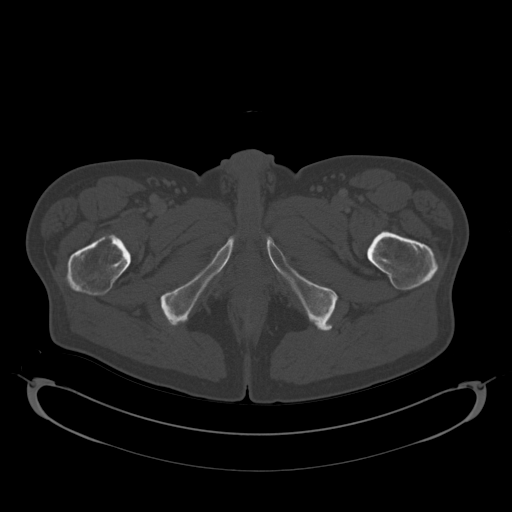
[im 54/233  soft-tissue]
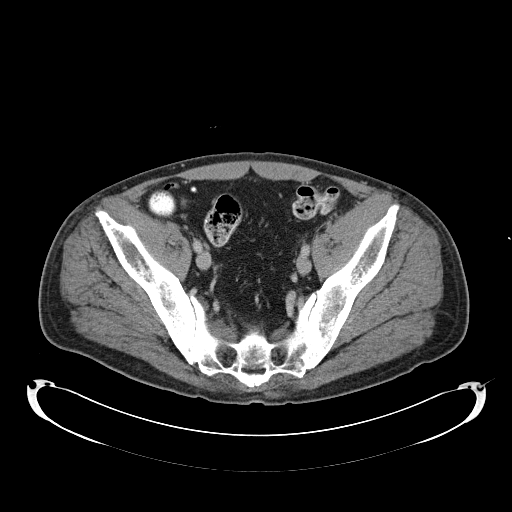
[im 72/233  soft-tissue]
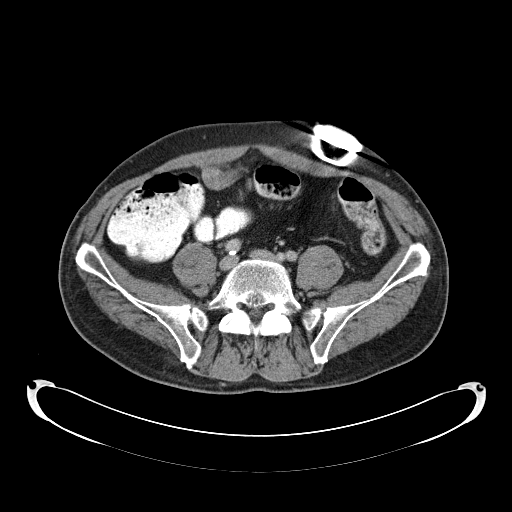
[im 90/233  soft-tissue]
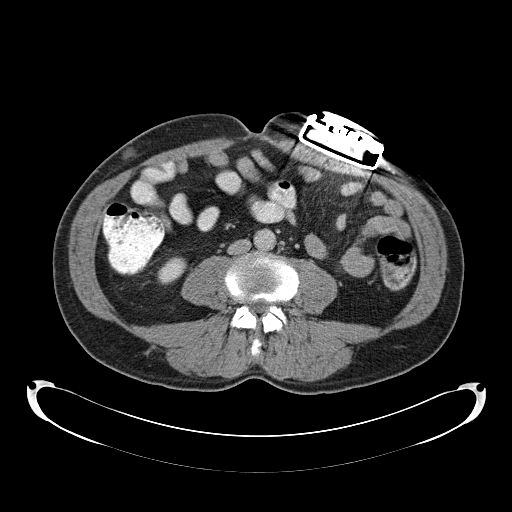
[im 125/233  soft-tissue]
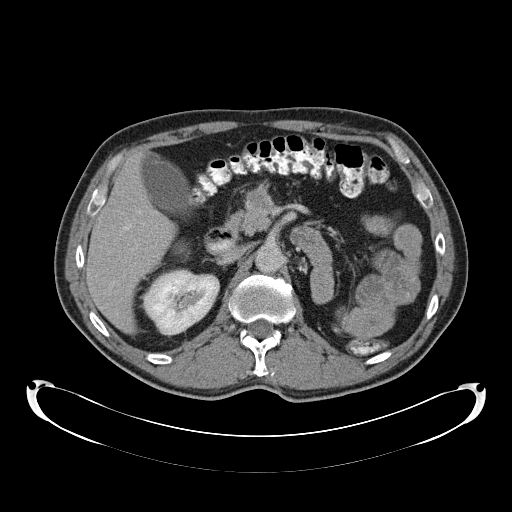
[im 143/233  soft-tissue]
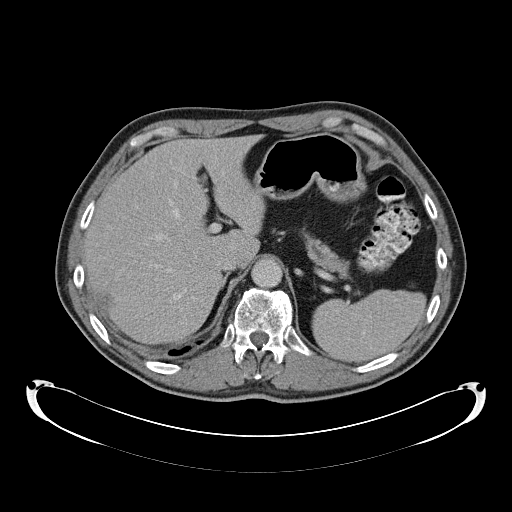
[im 161/233  soft-tissue]
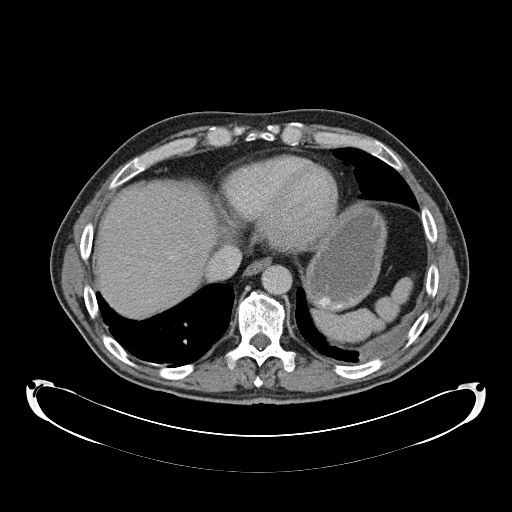
[im 161/233  lung]
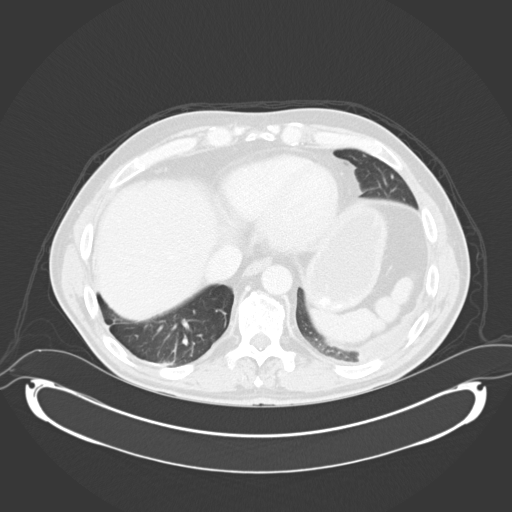
[im 179/233  lung]
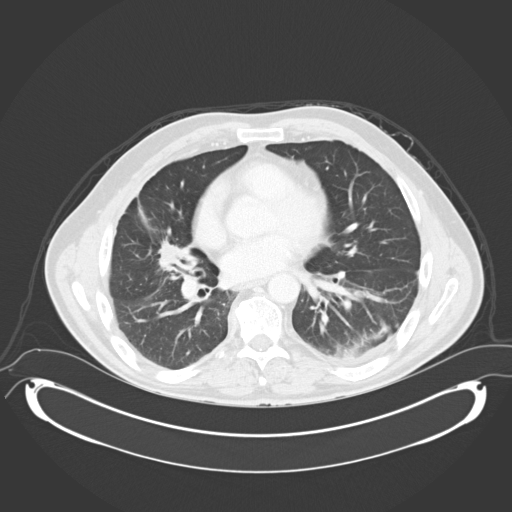
[im 197/233  soft-tissue]
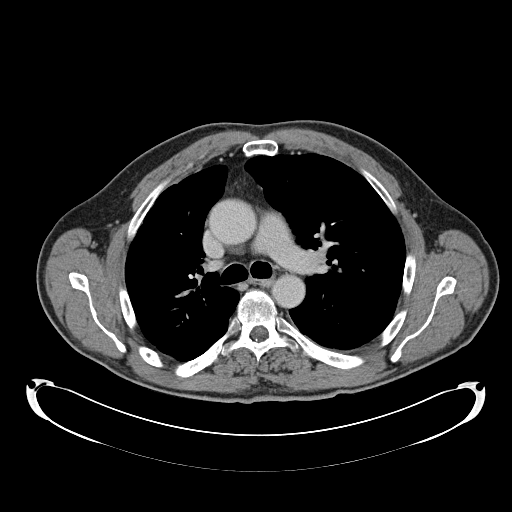
[im 197/233  lung]
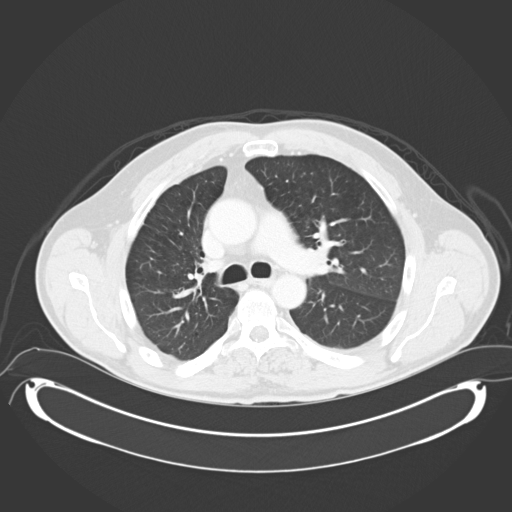
[im 215/233  soft-tissue]
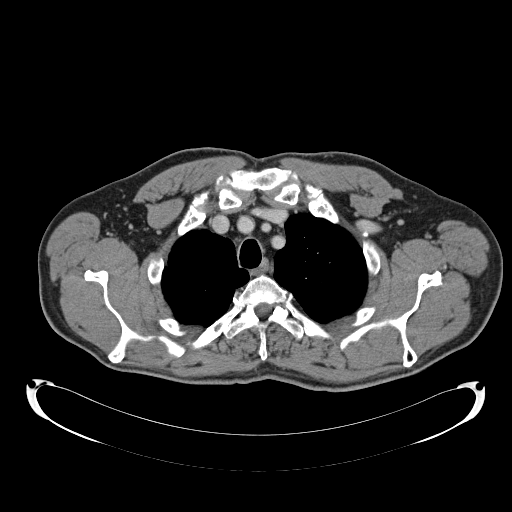
[im 215/233  lung]
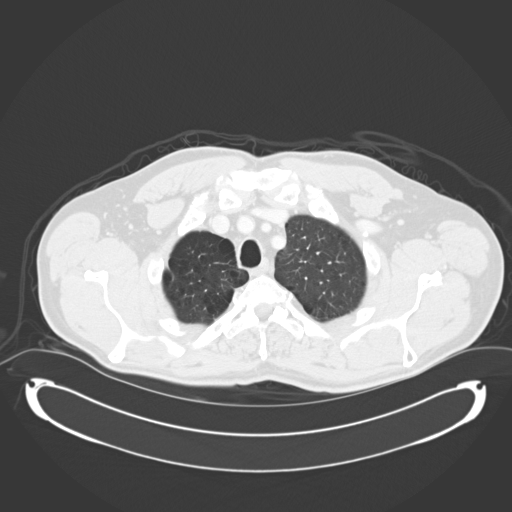
[im 215/233  bone]
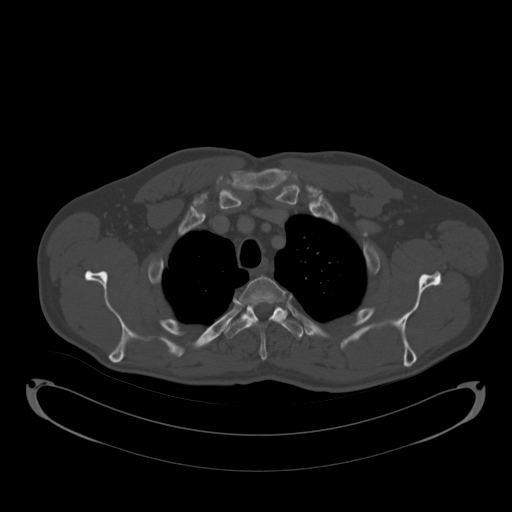

[Series 602: <mpr thick range> · coronal · 0.87mm/px · 2 of 88 slices shown]
[im 30/88  soft-tissue]
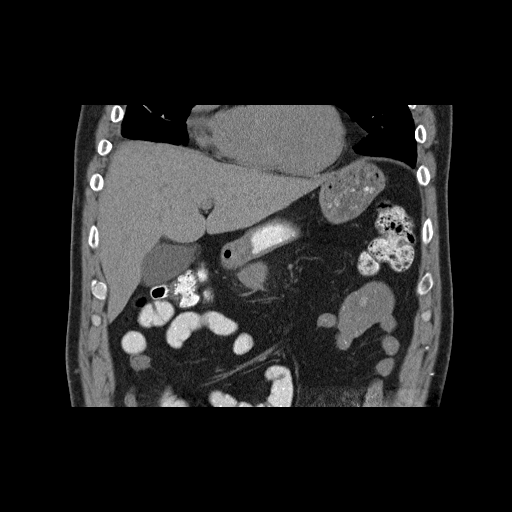
[im 59/88  soft-tissue]
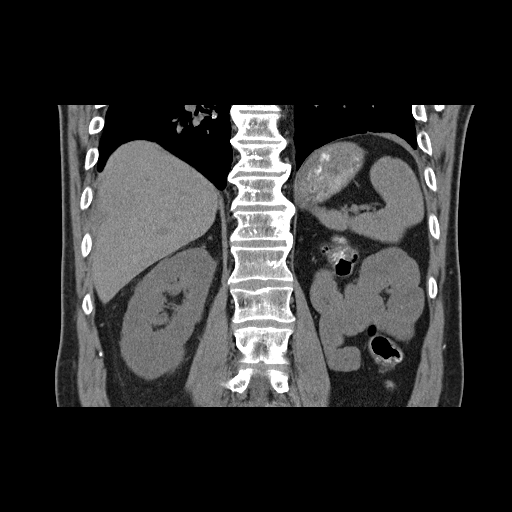

[12 of 46 positions shown; findings below may reference images not displayed]

FINDINGS: CT CHEST FINDINGS

Mediastinum/Nodes: No supraclavicular adenopathy. Tortuous thoracic
aorta with borderline aneurysmal dilatation of the ascending segment
at 4.0 cm. Mild cardiomegaly, without pericardial effusion. No
central pulmonary embolism, on this non-dedicated study. No
mediastinal adenopathy. Left infrahilar adenopathy is improved to
resolved. Measures 1.1 x 1.6 cm on image 42/series 6. Compare 2.6 x
2.7 cm on 09/12/2015.

Lungs/Pleura: Trace left-sided pleural fluid is new. Surgical
changes in the right lower lobe. 2 mm left upper lobe pulmonary
nodule is unchanged on image 17/series 9.

New dependent left lower lobe opacity is favored to represent
atelectasis. Similar appearance of areas of presumed radiation
fibrosis within the right upper lobe. Similar right middle lobe
peribronchovascular interstitial thickening, including on image
57/series 9. No endobronchial obstruction.

Musculoskeletal: Remote posterior right rib trauma.

CT ABDOMEN PELVIS FINDINGS

Hepatobiliary: Improvement to resolution of previously described
arterially hyper enhancing subcapsular focus in the right liver
lobe. Vague hyper enhancement identified in this area 7 mm on image
37 of series 4. Compare 12 mm on the prior. Less well-defined today,
and non apparent on portal venous phase imaging. No new liver
lesion. Subtle pericholecystic edema or gallbladder wall thickening
suspected on image 106/series 6. No calcified stone or biliary duct
dilatation.

Pancreas: Pancreatic atrophy and duct dilatation involving the body
and tail. The lesion within the pancreatic neck measures 11 mm on
image 43/series 4 versus 18 mm on the prior.

Pancreatic head lesion demonstrates less enhancement today and is
decreased in size. On the order of 2.3 x 2.6 cm on image 52/series
4. Compare 3.4 x 2.9 cm on the prior. No evidence of acute
pancreatitis.

Spleen: Normal in size, without focal abnormality.

Adrenals/Urinary Tract: Normal adrenal glands. Left nephrectomy,
without locally recurrent disease. Normal right kidney, without
hydronephrosis or hydroureter. Normal urinary bladder.

Stomach/Bowel: Normal stomach, without wall thickening. Normal
colon, appendix, and terminal ileum. Normal small bowel.

Vascular/Lymphatic: Aortic and branch vessel atherosclerosis. No
abdominopelvic adenopathy.

Reproductive: Normal prostate.

Other: No significant free fluid.

Musculoskeletal: Dorsal spinal stimulator. Lytic lesion involving
the proximal right femur similar,. Right inferior pubic ramus lytic
lesion measures 1.6 cm on image 214/series 6. Similar. Right sacral
lytic metastasis measures 2.2 x 3.0 cm today versus 2.4 x 3.0 cm on
the prior, suggesting stability.
IMPRESSION: 1. Response to therapy of thoracic nodal, hepatic, and pancreatic
metastasis, status post left nephrectomy.
2. Similar appearance the lungs. Areas of presumed radiation
fibrosis within the right upper lobe with similar interstitial
thickening within the central right middle lobe.
3. Similar osseous metastasis.
4. Trace left pleural fluid with left lower lobe opacity, favored to
represent atelectasis or possibly scarring.
5. Subtle pericholecystic edema cannot be excluded. Correlate with
any symptoms to suggest acute cholecystitis.
These results will be called to the ordering clinician or
representative by the Radiologist Assistant, and communication
documented in the PACS or zVision Dashboard.

## 2017-12-02 MED ORDER — ALPRAZOLAM 1 MG PO TABS: ORAL_TABLET | ORAL | 0 refills | Status: DC

## 2017-12-18 ENCOUNTER — Other Ambulatory Visit: Payer: Self-pay | Admitting: Oncology

## 2017-12-24 ENCOUNTER — Telehealth: Payer: Self-pay | Admitting: Oncology

## 2017-12-24 ENCOUNTER — Other Ambulatory Visit (HOSPITAL_BASED_OUTPATIENT_CLINIC_OR_DEPARTMENT_OTHER): Payer: Medicare Other

## 2017-12-24 ENCOUNTER — Ambulatory Visit (HOSPITAL_BASED_OUTPATIENT_CLINIC_OR_DEPARTMENT_OTHER): Payer: Medicare Other | Admitting: Oncology

## 2017-12-24 VITALS — BP 143/96 | HR 61 | Temp 97.6°F | Resp 17 | Ht 75.0 in | Wt 181.9 lb

## 2017-12-24 DIAGNOSIS — C7889 Secondary malignant neoplasm of other digestive organs: Secondary | ICD-10-CM | POA: Diagnosis not present

## 2017-12-24 DIAGNOSIS — F419 Anxiety disorder, unspecified: Secondary | ICD-10-CM

## 2017-12-24 DIAGNOSIS — M25561 Pain in right knee: Secondary | ICD-10-CM | POA: Diagnosis not present

## 2017-12-24 DIAGNOSIS — F418 Other specified anxiety disorders: Secondary | ICD-10-CM

## 2017-12-24 DIAGNOSIS — Z7901 Long term (current) use of anticoagulants: Secondary | ICD-10-CM | POA: Diagnosis not present

## 2017-12-24 DIAGNOSIS — C7951 Secondary malignant neoplasm of bone: Secondary | ICD-10-CM

## 2017-12-24 DIAGNOSIS — M79651 Pain in right thigh: Secondary | ICD-10-CM

## 2017-12-24 DIAGNOSIS — I2699 Other pulmonary embolism without acute cor pulmonale: Secondary | ICD-10-CM

## 2017-12-24 DIAGNOSIS — R197 Diarrhea, unspecified: Secondary | ICD-10-CM | POA: Diagnosis not present

## 2017-12-24 DIAGNOSIS — K611 Rectal abscess: Secondary | ICD-10-CM

## 2017-12-24 DIAGNOSIS — R209 Unspecified disturbances of skin sensation: Secondary | ICD-10-CM | POA: Diagnosis not present

## 2017-12-24 DIAGNOSIS — K8689 Other specified diseases of pancreas: Secondary | ICD-10-CM

## 2017-12-24 DIAGNOSIS — C649 Malignant neoplasm of unspecified kidney, except renal pelvis: Secondary | ICD-10-CM

## 2017-12-24 LAB — CBC WITH DIFFERENTIAL/PLATELET
BASO%: 0.6 % (ref 0.0–2.0)
Basophils Absolute: 0 10*3/uL (ref 0.0–0.1)
EOS%: 2 % (ref 0.0–7.0)
Eosinophils Absolute: 0.1 10*3/uL (ref 0.0–0.5)
HCT: 38.5 % (ref 38.4–49.9)
HGB: 12.4 g/dL — ABNORMAL LOW (ref 13.0–17.1)
LYMPH%: 28.9 % (ref 14.0–49.0)
MCH: 32.2 pg (ref 27.2–33.4)
MCHC: 32.1 g/dL (ref 32.0–36.0)
MCV: 100.2 fL — ABNORMAL HIGH (ref 79.3–98.0)
MONO#: 0.3 10*3/uL (ref 0.1–0.9)
MONO%: 6.2 % (ref 0.0–14.0)
NEUT#: 3.3 10*3/uL (ref 1.5–6.5)
NEUT%: 62.3 % (ref 39.0–75.0)
Platelets: 131 10*3/uL — ABNORMAL LOW (ref 140–400)
RBC: 3.84 10*6/uL — ABNORMAL LOW (ref 4.20–5.82)
RDW: 16.5 % — ABNORMAL HIGH (ref 11.0–14.6)
WBC: 5.3 10*3/uL (ref 4.0–10.3)
lymph#: 1.5 10*3/uL (ref 0.9–3.3)

## 2017-12-24 LAB — COMPREHENSIVE METABOLIC PANEL
ALT: 25 U/L (ref 0–55)
AST: 20 U/L (ref 5–34)
Albumin: 3 g/dL — ABNORMAL LOW (ref 3.5–5.0)
Alkaline Phosphatase: 95 U/L (ref 40–150)
Anion Gap: 5 mEq/L (ref 3–11)
BUN: 10.3 mg/dL (ref 7.0–26.0)
CO2: 32 mEq/L — ABNORMAL HIGH (ref 22–29)
Calcium: 8.8 mg/dL (ref 8.4–10.4)
Chloride: 105 mEq/L (ref 98–109)
Creatinine: 0.9 mg/dL (ref 0.7–1.3)
EGFR: >60 mL/min/{1.73_m2} (ref >60)
Glucose: 94 mg/dl (ref 70–140)
Potassium: 4.3 mEq/L (ref 3.5–5.1)
Sodium: 142 mEq/L (ref 136–145)
Total Bilirubin: 0.25 mg/dL (ref 0.20–1.20)
Total Protein: 6.3 g/dL — ABNORMAL LOW (ref 6.4–8.3)

## 2017-12-24 MED ORDER — DIPHENOXYLATE-ATROPINE 2.5-0.025 MG PO TABS: 1.0000 | ORAL_TABLET | Freq: Four times a day (QID) | ORAL | 0 refills | Status: DC | PRN

## 2017-12-24 MED ORDER — ALPRAZOLAM 1 MG PO TABS: ORAL_TABLET | ORAL | 0 refills | Status: DC

## 2017-12-24 NOTE — Progress Notes (Signed)
Hematology and Oncology Follow Up Visit  Keith [name] [id number] [birth year] 52 y.o. 12/24/2017 1:42 PM  [name], MD  [name], M.D.  [name], MD  [name], MD    Principle Diagnosis: 52 year old gentleman with stage IV renal cell carcinoma diagnosed in 2009.  Prior Therapy: 1. Status post laparoscopic radical nephrectomy.  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  2. Patient status post thoracotomy for a synchronous metastatic lung lesions done October 2009.  He had a lower lobe nodule, biopsy proven to be metastatic renal cell carcinoma.   3. Patient is status post stereotactic radiotherapy to pulmonary nodules in May of 2010. 4. He is S/P Sutent 50 mg 4 weeks on 2 weeks off from 10/2010 to 03/2013. He progressed at that time.  5. He is S/P radiation to the right sacral bone between 4/22 to 4/30.  6. He is S/P XRT to the left shoulder 03/20/14 to 03/31/14. 7. Votrient 800 mg by mouth daily from 03/2013 through 06/22/2015. Discontinued secondary to disease progression. 8. Nivolumab 3 mg/kg given every 2 weeks started on 06/29/2015. He is status post 4 cycles completed 08/10/2015. He developed disease progression in September 2016.  9. Status post radiation therapy to the left mid fibula completed on 11/14/2015. He received a grade 1 fraction.    Current therapy:  Cabometyx 60 mg daily started in November 2016.   Interim History:  [name] presents today for a followup visit. Since his last visit, he reports increased pain in his right lower extremity.  He reports vague pain that has been intermittent in the back of his knee and mid thigh.  He does report numbness bilaterally but still able to ambulate.  He denies any falls or syncope.  He denies any trauma.     He continues to take Cabometyx at the 60 mg dose without any other complications. His diarrhea is manageable usually with Imodium and Lomotil. He reports his appetite is  excellent and he is able to maintain his weight.. He denied any hand-foot syndrome, fatigue or decline in his quality of life or performance status.  His pain is overall manageable.  He did not report any headaches blurred vision or double vision. Does not report any seizure activity or alteration of mental status. Does not report any abdominal pain or hematochezia. Does not report any hematuria but does report occasional hesitancy and nocturia. He does not report any rashes or lesions or petechiae. He does not report any lymphadenopathy.  Remainder of his review of system is unremarkable.  Medications: I have reviewed the patient's current medications.  Current Outpatient Medications  Medication Sig Dispense Refill  . ALPRAZolam (XANAX) 1 MG tablet TAKE 1 TABLET THREE TIMES A DAY AS NEEDED FOR ANXIETY 90 tablet 0  . cabozantinib S-Malate (CABOMETYX) 60 MG TABS Take 60 mg by mouth daily. 30 tablet 0  . calcium carbonate (TUMS - DOSED IN MG ELEMENTAL CALCIUM) 500 MG chewable tablet Chew 1 tablet by mouth as needed for indigestion or heartburn.    . dextromethorphan (ROBITUSSIN MAXIMUM STRENGTH) 15 MG/5ML syrup Take 10 mLs (30 mg total) by mouth 4 (four) times daily as needed for cough. 120 mL 0  . diphenhydrAMINE (BENADRYL) 25 mg capsule Take 25 mg by mouth every 6 (six) hours as needed for itching.    . diphenoxylate-atropine (LOMOTIL) 2.5-0.025 MG tablet Take 1 tablet by mouth 4 (four) times daily as needed for diarrhea or loose stools.  60 tablet 0  . DULoxetine (CYMBALTA) 60 MG capsule TAKE 1 CAPSULE (60 MG TOTAL) BY MOUTH 2 (TWO) TIMES DAILY. 180 capsule 0  . enoxaparin (LOVENOX) 150 MG/ML injection INJECT 0.93 MLS (140 MG TOTAL) INTO THE SKIN DAILY. 30 Syringe 1  . feeding supplement, ENSURE ENLIVE, (ENSURE ENLIVE) LIQD Take 237 mLs by mouth 2 (two) times daily between meals. 237 mL 12  . gabapentin (NEURONTIN) 300 MG capsule TAKE 1 CAPSULE (300 MG TOTAL) BY MOUTH 3 (THREE) TIMES DAILY. 270  capsule 0  . HYDROmorphone (DILAUDID) 8 MG tablet Take 1 tablet (8 mg total) by mouth every 4 (four) hours as needed for moderate pain or severe pain. 50 tablet 0  . ibuprofen (ADVIL,MOTRIN) 200 MG tablet Take 400 mg by mouth every 6 (six) hours as needed for fever, headache, moderate pain or cramping.    . insulin glargine (LANTUS) 100 UNIT/ML injection Inject 26 Units into the skin at bedtime.    [name] lactulose (CHRONULAC) 10 GM/15ML solution Take 30 mLs (20 g total) by mouth 2 (two) times daily. 240 mL 0  . metoprolol tartrate (LOPRESSOR) 25 MG tablet Take 1 tablet (25 mg total) by mouth 2 (two) times daily. 60 tablet 0  . quiNINE (QUALAQUIN) 324 MG capsule Take 2 capsules (648 mg total) by mouth 3 (three) times daily. 30 capsule 0  . rivaroxaban (XARELTO) 20 MG TABS tablet Take 1 tablet (20 mg total) daily with supper by mouth. 90 tablet 6  . senna-docusate (SENOKOT-S) 8.6-50 MG tablet Take 1 tablet by mouth 2 (two) times daily. 30 tablet 0  . tiZANidine (ZANAFLEX) 4 MG tablet Take 4 mg by mouth every 8 (eight) hours as needed for muscle spasms.   0  . VIAGRA 100 MG tablet Take 50 mg by mouth as needed for erectile dysfunction.      No current facility-administered medications for this visit.      Allergies:  Allergies  Allergen Reactions  . Ceftriaxone Hives  . Hydrocodone Swelling    Past Medical History, Surgical history, Social history, and Family History were reviewed and updated.   Physical Exam: Blood pressure (!) 143/96, pulse 61, temperature 97.6 F (36.4 C), temperature source Oral, resp. rate 17, height 6\' 3"  (1.905 m), weight 181 lb 14.4 oz (82.5 kg), SpO2 100 %. ECOG: 1 General appearance: Chronically ill-appearing gentleman appeared without distress today. Head: No oral ulcers or lesions. Neck: no adenopathy, no masses.  Lymph nodes: Cervical, supraclavicular, and axillary nodes normal. Heart:regular rate and rhythm, S1, S2.  Lung: Clear throughout auscultation. No  wheezes noted. Abdomen: Soft, nontender without rebound or guarding.  Good bowel sounds bilaterally. EXT: No rashes or lesions noted on his right lower extremity.  Tenderness to palpation noted on his distal right femur. Neuro: No focal deficits noted.   Lab Results: Lab Results  Component Value Date   WBC 5.3 12/24/2017   HGB 12.4 (L) 12/24/2017   HCT 38.5 12/24/2017   MCV 100.2 (H) 12/24/2017   PLT 131 (L) 12/24/2017    Impression and Plan:  52 year old gentleman with the following issues.  1. Metastatic renal cell carcinoma.  He has documented disease to the lung and the pancreas. He is status post multiple therapies. He is currently on Cabometyx and have tolerated it well. CT scan on August 2018 did not show any major changes.  Risks and benefits of continuing this medication was reviewed today and is agreeable to continue.  He will have repeat imaging studies  scheduled for the end of January. 2. Depression/Anxiety: He remains on Xanax and Cymbalta.his Xanax will be refilled today. 3. Weight loss: Appetite is improving and weight has improved. 4. Right lower extremity pain: Unclear etiology at this time but needs to be evaluated for possible metastatic disease to the bone.  We will obtain an MRI of the right femur and consider radiation therapy if any metastasis is noted. 5. Hypertension: Controlled without antihypertensive medication. 6. Pulmonary embolism: He will continue on Xarelto indefinitely. 7. Prognosis: He does have an incurable malignancy however he continues to have excellent performance status and aggressive therapy is warranted. 8. Diarrhea: Manageable at this time.  Prescription for Lomotil was given to the patient today. 9. Follow up: in 4 weeks.   Zola Button, MD 12/24/17

## 2017-12-24 NOTE — Telephone Encounter (Signed)
Gave avs and calendar for January and February

## 2017-12-28 ENCOUNTER — Other Ambulatory Visit: Payer: Self-pay | Admitting: *Deleted

## 2017-12-28 DIAGNOSIS — C649 Malignant neoplasm of unspecified kidney, except renal pelvis: Secondary | ICD-10-CM

## 2017-12-28 MED ORDER — CABOZANTINIB S-MALATE 60 MG PO TABS: 1.0000 | ORAL_TABLET | Freq: Every day | ORAL | 0 refills | Status: DC

## 2017-12-29 ENCOUNTER — Ambulatory Visit: Payer: Medicare Other | Admitting: Oncology

## 2017-12-29 ENCOUNTER — Other Ambulatory Visit: Payer: Self-pay | Admitting: *Deleted

## 2017-12-29 ENCOUNTER — Other Ambulatory Visit: Payer: Medicare Other

## 2017-12-29 DIAGNOSIS — C649 Malignant neoplasm of unspecified kidney, except renal pelvis: Secondary | ICD-10-CM

## 2017-12-29 MED ORDER — CABOZANTINIB S-MALATE 60 MG PO TABS: 1.0000 | ORAL_TABLET | Freq: Every day | ORAL | 0 refills | Status: DC

## 2018-01-06 DIAGNOSIS — Z9689 Presence of other specified functional implants: Secondary | ICD-10-CM | POA: Diagnosis not present

## 2018-01-06 DIAGNOSIS — G629 Polyneuropathy, unspecified: Secondary | ICD-10-CM | POA: Diagnosis not present

## 2018-01-06 DIAGNOSIS — M25551 Pain in right hip: Secondary | ICD-10-CM | POA: Diagnosis not present

## 2018-01-06 DIAGNOSIS — M5417 Radiculopathy, lumbosacral region: Secondary | ICD-10-CM | POA: Diagnosis not present

## 2018-01-20 ENCOUNTER — Encounter (HOSPITAL_COMMUNITY): Payer: Self-pay

## 2018-01-20 ENCOUNTER — Ambulatory Visit (HOSPITAL_COMMUNITY)
Admission: RE | Admit: 2018-01-20 | Discharge: 2018-01-20 | Disposition: A | Discharge status: Home / Self Care | Payer: Medicare Other | Source: Ambulatory Visit | Attending: Oncology | Admitting: Oncology

## 2018-01-20 ENCOUNTER — Inpatient Hospital Stay: Payer: Medicare Other | Attending: Oncology

## 2018-01-20 DIAGNOSIS — C649 Malignant neoplasm of unspecified kidney, except renal pelvis: Secondary | ICD-10-CM

## 2018-01-20 DIAGNOSIS — C7951 Secondary malignant neoplasm of bone: Secondary | ICD-10-CM | POA: Diagnosis not present

## 2018-01-20 DIAGNOSIS — J439 Emphysema, unspecified: Secondary | ICD-10-CM | POA: Insufficient documentation

## 2018-01-20 DIAGNOSIS — I700 Atherosclerosis of aorta: Secondary | ICD-10-CM | POA: Insufficient documentation

## 2018-01-20 DIAGNOSIS — C7889 Secondary malignant neoplasm of other digestive organs: Secondary | ICD-10-CM | POA: Diagnosis not present

## 2018-01-20 LAB — COMPREHENSIVE METABOLIC PANEL
ALT: 23 U/L (ref 0–55)
AST: 21 U/L (ref 5–34)
Albumin: 3.3 g/dL — ABNORMAL LOW (ref 3.5–5.0)
Alkaline Phosphatase: 108 U/L (ref 40–150)
Anion gap: 6 (ref 3–11)
BUN: 8 mg/dL (ref 7–26)
CO2: 28 mmol/L (ref 22–29)
Calcium: 8.8 mg/dL (ref 8.4–10.4)
Chloride: 104 mmol/L (ref 98–109)
Creatinine, Ser: 0.96 mg/dL (ref 0.70–1.30)
GFR calc Af Amer: >60 mL/min (ref >60)
GFR calc non Af Amer: >60 mL/min (ref >60)
Glucose, Bld: 104 mg/dL (ref 70–140)
Potassium: 4.5 mmol/L (ref 3.5–5.1)
Sodium: 138 mmol/L (ref 136–145)
Total Bilirubin: 0.2 mg/dL (ref 0.2–1.2)
Total Protein: 7.1 g/dL (ref 6.4–8.3)

## 2018-01-20 LAB — CBC WITH DIFFERENTIAL/PLATELET
Basophils Absolute: 0 10*3/uL (ref 0.0–0.1)
Basophils Relative: 1 %
Eosinophils Absolute: 0.2 10*3/uL (ref 0.0–0.5)
Eosinophils Relative: 3 %
HCT: 43.8 % (ref 38.4–49.9)
Hemoglobin: 13.6 g/dL (ref 13.0–17.1)
Lymphocytes Relative: 28 %
Lymphs Abs: 1.8 10*3/uL (ref 0.9–3.3)
MCH: 32.4 pg (ref 27.2–33.4)
MCHC: 31.1 g/dL — ABNORMAL LOW (ref 32.0–36.0)
MCV: 104.3 fL — ABNORMAL HIGH (ref 79.3–98.0)
Monocytes Absolute: 0.3 10*3/uL (ref 0.1–0.9)
Monocytes Relative: 5 %
Neutro Abs: 4.1 10*3/uL (ref 1.5–6.5)
Neutrophils Relative %: 63 %
Platelets: 146 10*3/uL (ref 140–400)
RBC: 4.2 MIL/uL (ref 4.20–5.82)
RDW: 15.9 % — ABNORMAL HIGH (ref 11.0–14.6)
WBC: 6.4 10*3/uL (ref 4.0–10.3)

## 2018-01-20 MED ORDER — GADOBENATE DIMEGLUMINE 529 MG/ML IV SOLN
20.0000 mL | Freq: Once | INTRAVENOUS | Status: AC | PRN
  Administered 2018-01-20: 17 mL via INTRAVENOUS

## 2018-01-20 MED ORDER — IOPAMIDOL (ISOVUE-300) INJECTION 61%
100.0000 mL | Freq: Once | INTRAVENOUS | Status: AC | PRN
  Administered 2018-01-20: 100 mL via INTRAVENOUS

## 2018-01-20 MED ORDER — IOPAMIDOL (ISOVUE-300) INJECTION 61%
INTRAVENOUS | Status: AC
  Administered 2018-01-20: 100 mL via INTRAVENOUS
  Filled 2018-01-20: qty 100

## 2018-01-22 ENCOUNTER — Inpatient Hospital Stay: Payer: Medicare Other | Attending: Oncology | Admitting: Oncology

## 2018-01-22 VITALS — BP 138/90 | HR 83 | Temp 97.7°F | Resp 18 | Ht 75.0 in | Wt 185.7 lb

## 2018-01-22 DIAGNOSIS — F418 Other specified anxiety disorders: Secondary | ICD-10-CM

## 2018-01-22 DIAGNOSIS — K521 Toxic gastroenteritis and colitis: Secondary | ICD-10-CM | POA: Insufficient documentation

## 2018-01-22 DIAGNOSIS — C649 Malignant neoplasm of unspecified kidney, except renal pelvis: Secondary | ICD-10-CM | POA: Insufficient documentation

## 2018-01-22 DIAGNOSIS — Z7901 Long term (current) use of anticoagulants: Secondary | ICD-10-CM

## 2018-01-22 DIAGNOSIS — C7951 Secondary malignant neoplasm of bone: Secondary | ICD-10-CM | POA: Insufficient documentation

## 2018-01-22 DIAGNOSIS — C7801 Secondary malignant neoplasm of right lung: Secondary | ICD-10-CM | POA: Diagnosis not present

## 2018-01-22 DIAGNOSIS — I10 Essential (primary) hypertension: Secondary | ICD-10-CM

## 2018-01-22 DIAGNOSIS — G893 Neoplasm related pain (acute) (chronic): Secondary | ICD-10-CM | POA: Insufficient documentation

## 2018-01-22 DIAGNOSIS — Z86718 Personal history of other venous thrombosis and embolism: Secondary | ICD-10-CM | POA: Diagnosis not present

## 2018-01-22 NOTE — Progress Notes (Signed)
Hematology and Oncology Follow Up Visit  Keith [name] [id number] [birth year] 52 y.o. 01/22/2018 8:38 AM  [name], MD  [name], M.D.  [name], MD  [name], MD    Principle Diagnosis: 52 year old man with stage IV renal cell carcinoma diagnosed in 2009.  He has disease to the bone as well as the lung.  Prior Therapy: 1. Status post laparoscopic radical nephrectomy.  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  2. Patient status post thoracotomy for a synchronous metastatic lung lesions done October 2009.   3. Patient is status post stereotactic radiotherapy to pulmonary nodules in May of 2010. 4. He is S/P Sutent 50 mg 4 weeks on 2 weeks off from 10/2010 to 03/2013. He progressed at that time.  5. He is S/P radiation to the right sacral bone between 4/22 to 4/30.  6. He is S/P XRT to the left shoulder 03/20/14 to 03/31/14. 7. Votrient 800 mg by mouth daily from 03/2013 through 06/22/2015. Discontinued secondary to disease progression. 8. Nivolumab 3 mg/kg given every 2 weeks started on 06/29/2015. He is status post 4 cycles completed 08/10/2015. He developed disease progression in September 2016.  9. Status post radiation therapy to the left mid fibula completed on 11/14/2015. He received a grade 1 fraction.    Current therapy:  Cabometyx 60 mg daily started in November 2016.   Interim History:  Keith [name] is here for a follow-up.  He continues to do well since the last visit but his main concern and issue is right leg pain.  Reports pain in his distal femur especially when he reclines in a chair.  He reports is able to ambulate and bear weight without any major difficulties.  Has not reported any falls or tripping.  He is able to attend activities of daily living without any decline.  He does take Dilaudid for pain that is manageable.   He continues to take Cabometyx at the 60 mg dose without any recent changes or new side effects.  He  does report mild diarrhea which has not changed at this time and remains manageable.  He denies any hand-foot syndrome or anorexia.  His weight is excellent and have gained more weight since last visit.  He did not report any headaches blurred vision or double vision. Does not report any seizure activity or alteration of mental status.  He does not report any chest pain, palpitation, orthopnea or leg edema.  He does not report any cough, wheezing or hemoptysis.  Does not report any abdominal pain or hematochezia. Does not report any hematuria but does report occasional hesitancy and nocturia. He does not report any rashes or lesions or petechiae. He does not report any lymphadenopathy.  He does not report any anxiety or depression.  His mood remained stable.  He does not report any other bone pain or pathological fractures.  He does not report any heat or cold intolerance.  Remainder of his review of system is negative.   Medications: I have reviewed the patient's current medications.  Current Outpatient Medications  Medication Sig Dispense Refill  . ALPRAZolam (XANAX) 1 MG tablet TAKE 1 TABLET THREE TIMES A DAY AS NEEDED FOR ANXIETY 90 tablet 0  . cabozantinib S-Malate (CABOMETYX) 60 MG TABS Take 1 tablet by mouth daily. With at least 8 ounces of water on an empty stomach. Do not eat for 2 hours before or 1 hour after. Swallow whole. 30 tablet 0  .  calcium carbonate (TUMS - DOSED IN MG ELEMENTAL CALCIUM) 500 MG chewable tablet Chew 1 tablet by mouth as needed for indigestion or heartburn.    . dextromethorphan (ROBITUSSIN MAXIMUM STRENGTH) 15 MG/5ML syrup Take 10 mLs (30 mg total) by mouth 4 (four) times daily as needed for cough. 120 mL 0  . diphenoxylate-atropine (LOMOTIL) 2.5-0.025 MG tablet Take 1 tablet by mouth 4 (four) times daily as needed for diarrhea or loose stools. 60 tablet 0  . DULoxetine (CYMBALTA) 60 MG capsule TAKE 1 CAPSULE (60 MG TOTAL) BY MOUTH 2 (TWO) TIMES DAILY. 180 capsule 0  .  feeding supplement, ENSURE ENLIVE, (ENSURE ENLIVE) LIQD Take 237 mLs by mouth 2 (two) times daily between meals. 237 mL 12  . gabapentin (NEURONTIN) 300 MG capsule TAKE 1 CAPSULE (300 MG TOTAL) BY MOUTH 3 (THREE) TIMES DAILY. 270 capsule 0  . HYDROmorphone (DILAUDID) 8 MG tablet Take 1 tablet (8 mg total) by mouth every 4 (four) hours as needed for moderate pain or severe pain. 50 tablet 0  . lactulose (CHRONULAC) 10 GM/15ML solution Take 30 mLs (20 g total) by mouth 2 (two) times daily. 240 mL 0  . metoprolol tartrate (LOPRESSOR) 25 MG tablet Take 1 tablet (25 mg total) by mouth 2 (two) times daily. 60 tablet 0  . rivaroxaban (XARELTO) 20 MG TABS tablet Take 1 tablet (20 mg total) daily with supper by mouth. 90 tablet 6  . senna-docusate (SENOKOT-S) 8.6-50 MG tablet Take 1 tablet by mouth 2 (two) times daily. 30 tablet 0  . tiZANidine (ZANAFLEX) 4 MG tablet Take 4 mg by mouth every 8 (eight) hours as needed for muscle spasms.   0  . ibuprofen (ADVIL,MOTRIN) 200 MG tablet Take 400 mg by mouth every 6 (six) hours as needed for fever, headache, moderate pain or cramping.     No current facility-administered medications for this visit.      Allergies:  Allergies  Allergen Reactions  . Ceftriaxone Hives  . Hydrocodone Swelling    Past Medical History, Surgical history, Social history, and Family History were reviewed and updated.   Physical Exam: Blood pressure 138/90, pulse 83, temperature 97.7 F (36.5 C), temperature source Oral, resp. rate 18, height 6\' 3"  (1.905 m), weight 185 lb 11.2 oz (84.2 kg), SpO2 100 %. ECOG: 1 General appearance: Alert, awake gentleman appeared without distress. Head: Normocephalic atraumatic.   Oral mucosa: Moist mucous membranes.  No oral ulcers or lesions. Eyes: No scleral icterus. Lymph nodes: Cervical, supraclavicular, and axillary nodes normal. Heart:regular rate and rhythm, without murmurs rubs or gallops. Lung: Clear throughout lung fields to  auscultation.  No rhonchi or wheezes. Abdomen: Soft, nontender with good bowel sounds in all 4 quadrants.  No rebound or guarding. Skin:: No rashes or lesions noted on his right lower extremity.   Musculoskeletal: Tenderness to palpation noted on his distal right femur.  Full range of motion in all his joints. Neuro: No focal deficits noted.   Lab Results: Lab Results  Component Value Date   WBC 6.4 01/20/2018   HGB 13.6 01/20/2018   HCT 43.8 01/20/2018   MCV 104.3 (H) 01/20/2018   PLT 146 01/20/2018   EXAM: MRI OF THE RIGHT FEMUR WITHOUT AND WITH CONTRAST  TECHNIQUE: Multiplanar, multisequence MR imaging of the right femur was performed both before and after administration of intravenous contrast.  CONTRAST:  65mL MULTIHANCE GADOBENATE DIMEGLUMINE 529 MG/ML IV SOLN  COMPARISON:  None.  FINDINGS: Bones/Joint/Cartilage  There is an irregular 3.8  x 3.1 x 3.1 cm destructive mass lesion in the anterior aspect of the intertrochanteric region of the proximal right femur with focal anterior cortical destruction.  There is a 6.8 x 3.5 x 3.1 cm destructive lesion in the distal right femoral shaft just above the femoral condyles with disruption of the posterior cortex with extension of tumor into the adjacent anterolateral soft tissues.  There is irregular enhancement of both masses. The patient is at risk for pathologic fractures at both sites.  IMPRESSION: 1. Metastatic lesions in the proximal and distal right femur with cortical disruption at both sites putting the patient at risk for pathologic fractures. 2. There is soft tissue extension of tumor anterior laterally at the distal lesion.  EXAM: CT CHEST WITH CONTRAST  CT ABDOMEN AND PELVIS WITH AND WITHOUT CONTRAST  TECHNIQUE: Multidetector CT imaging of the chest was performed during intravenous contrast administration. Multidetector CT imaging of the abdomen and pelvis was performed following the  standard protocol before and during bolus administration of intravenous contrast.  CONTRAST:  100 cc of Isovue-300  COMPARISON:  07/28/2017.  FINDINGS: CT CHEST FINDINGS  Cardiovascular: Aortic and branch vessel atherosclerosis. Normal heart size, without pericardial effusion. No central pulmonary embolism, on this non-dedicated study.  Mediastinum/Nodes: No supraclavicular adenopathy. No mediastinal or hilar adenopathy.  Lungs/Pleura: Right upper lobe presumed treated metastasis, including 3.4 x 1.5 cm on image 56/series 15. Compare 3.5 x 1.6 cm on the prior.  Central right middle lobe pulmonary nodule measures 2.4 x 1.8 cm on image 95/series 15. Compare 2.3 x 2.1 cm on the prior.  Right lower lobe surgical sutures. Pleural-based left lower lobe nodular density measures 2.3 x 1.0 cm on image 114/series 15. Compare 2.3 x 1.3 cm on the prior.  Lateral left lower lobe scarring.  Musculoskeletal: Dorsal spinal stimulator. Heterogeneous expansile lesion involving the posterior right fourth rib, including on image 27/series 2, similar.  CT ABDOMEN AND PELVIS FINDINGS  Hepatobiliary: Normal liver. Normal gallbladder, without biliary ductal dilatation.  Pancreas: Again identified is moderate pancreatic duct dilatation within the body and tail. Subtle enhancing nodularity along the duct within the body is similar on image 35/series 6. Hyperenhancing lesion within the pancreatic neck measures 1.7 x 1.8 cm on image 38/series 6 and is similar 1.8 x 1.8 cm on the prior. An adjacent primarily exophytic pancreatic head lesion ventrally measures 3.2 x 2.8 cm on image 49/series 6. This is enlarged from 2.7 x 2.4 cm on the prior.  No acute pancreatitis.  Spleen: Normal in size, without focal abnormality.  Adrenals/Urinary Tract: Status post left nephrectomy, without locally recurrent disease. Normal right adrenal gland and kidney. Normal urinary  bladder.  Stomach/Bowel: Normal stomach, without wall thickening. Normal colon and terminal ileum. Normal small bowel.  Vascular/Lymphatic: Aortic and branch vessel atherosclerosis. No abdominopelvic adenopathy.  Reproductive: Normal prostate.  Other: Trace fluid within the right hemipelvis, including on image 186/series 11. This is new, but of indeterminate etiology.  Musculoskeletal: Nonspecific edema about the right pelvic wall subcutaneous fat anteriorly. This is similar on image 162/series 11. Right inferior pubic ramus lytic lesion is similar. Incompletely imaged right proximal femoral lesion with cortical involvement. Example at 3.3 cm on image 227/series 11. This is unchanged.  Relatively ill-defined right sacral lytic lesion, including at 3.3 cm.  IMPRESSION: CT CHEST IMPRESSION  1. Similar to slight improvement in pulmonary lesions, most consistent with treated metastasis. 2. No thoracic adenopathy or progressive disease identified in the chest. 3. Aortic atherosclerosis (ICD10-I70.0) and  emphysema (ICD10-J43.9). 4. Trace left-sided pleural fluid, similar.  CT ABDOMEN AND PELVIS IMPRESSION  1. Pancreatic metastasis. The dominant lesion has enlarged minimally. The smaller lesion, within the neck, is not significantly changed. 2. No new sites of disease identified. 3. Similar osseous metastasis. 4. New trace right hemi pelvic fluid, nonspecific   Impression and Plan:  51 year old with the following issues.  1. Stage IV renal cell carcinoma with disease to the bone and the lung.  He has received multiple therapies in the past outlined above.  He is currently on Cabometyx 60 mg and continues to tolerate it well.  CT scan obtained on January 20, 2018 was personally reviewed today and discussed with the patient.  He continues to show excellent response to therapy without any new or residual disease.  Risks and benefits of continuing this medication was  reviewed today and is agreeable to continue. 2. Depression/Anxiety: He remains on Xanax and Cymbalta. His mood has been assessed and has been stable and overall his attitude has been excellent toward his diagnosis and prognosis. 3. Weight loss: Resolved at this time and his appetite has been excellent.  His weight has been stable. 4. Right lower extremity pain: Related to metastatic disease to the femur.  I discussed the findings of his MRI in detail today and he will require orthopedic evaluation for possible surgical intervention and possible radiation therapy afterwards.  We will make the appropriate referrals today to [facility] and radiation oncology.. 5. Hypertension: His blood pressure under excellent control. 6. Pulmonary embolism: No recent thrombosis or bleeding.  He will continue on Xarelto indefinitely.  He has no issues or complications related to that. 7. Prognosis: He understands he has an incurable malignancy but his cancer has been palliated for the last 10 years.  His performance status remains excellent and aggressive therapy is warranted. 8. Diarrhea: Related to his cancer therapy and appears to be manageable.  Continues to use Lomotil and Imodium as needed. 9. Follow up: in 4 weeks.   25 minutes was spent with the patient face-to-face today.  More than 50% of time was dedicated to patient counseling, education and coordination of his multifaceted care.   Zola Button, MD 01/22/18

## 2018-01-25 ENCOUNTER — Encounter: Payer: Self-pay | Admitting: Radiation Oncology

## 2018-01-26 NOTE — Progress Notes (Addendum)
Histology and Location of Primary Cancer: Stage IV renal cell carcinoma with disease to the bone and the lung,cancer to femur bone with pain  Sites of Visceral and Bony Metastatic Disease: Right leg distal femur,knee and mid thigh  Location(s) of Symptomatic Metastases: Right leg distal femur,knee and mid thigh  Past/Anticipated chemotherapy by medical oncology, if any: 01-20-18 Dr. [name]   01-20-18 MR Femur w wo contrast FINDINGS: Bones/Joint/Cartilage  There is an irregular 3.8 x 3.1 x 3.1 cm destructive mass lesion in the anterior aspect of the intertrochanteric region of the proximal right femur with focal anterior cortical destruction.  There is a 6.8 x 3.5 x 3.1 cm destructive lesion in the distal right femoral shaft just above the femoral condyles with disruption of the posterior cortex with extension of tumor into the adjacent anterolateral soft tissues.  There is irregular enhancement of both masses. The patient is at risk for pathologic fractures at both sites.  IMPRESSION: 1. Metastatic lesions in the proximal and distal right femur with cortical disruption at both sites putting the patient at risk for pathologic fractures. 2. There is soft tissue extension of tumor anterior laterally at the distal lesion.  CT ABDOMEN AND PELVIS WITH AND WITHOUT CONTRAST 01-20-18 IMPRESSION:  CT CHEST IMPRESSION  1. Similar to slight improvement in pulmonary lesions, most consistent with treated metastasis. 2. No thoracic adenopathy or progressive disease identified in the chest. 3. Aortic atherosclerosis (ICD10-I70.0) and emphysema (ICD10-J43.9). 4. Trace left-sided pleural fluid, similar.  CT ABDOMEN AND PELVIS IMPRESSION  1. Pancreatic metastasis. The dominant lesion has enlarged minimally. The smaller lesion, within the neck, is not significantly changed. 2. No new sites of disease identified. 3. Similar osseous metastasis. 4. New trace right hemi pelvic fluid,  nonspecific  CT chest w contrast  IMPRESSION: CT CHEST IMPRESSION  1. Similar to slight improvement in pulmonary lesions, most consistent with treated metastasis. 2. No thoracic adenopathy or progressive disease identified in the chest. 3. Aortic atherosclerosis (ICD10-I70.0) and emphysema (ICD10-J43.9). 4. Trace left-sided pleural fluid, similar.  CT ABDOMEN AND PELVIS IMPRESSION  1. Pancreatic metastasis. The dominant lesion has enlarged minimally. The smaller lesion, within the neck, is not significantly changed. 2. No new sites of disease identified. 3. Similar osseous metastasis. 4. New trace right hemi pelvic fluid, nonspecific.   Pain on a scale of 0-10 is: 6/10 Right leg femur area taking Dilaudid and has a Morphine pu ordered 1 mg/mL morphine, preservative free  x 3 year  intrathecal pain pump placement  Inserted 05-16-15 at [facility] per Dr. Clydell Hakim at [facility] called for specific instructions for the Morphine administration.  Patient says he gives himself 1 mg/ml at times usually the pump just gives the dose. Has not seen a orthopedic doctor yet.  If Spine Met(s), symptoms, if any, include: No  Bowel/Bladder retention or incontinence (please describe): Having a normal bowel movement daily; has a normal urinary voiding pattern of              Two to three hours during the day and nocturia once.  Numbness or weakness in extremities (please describe): Weakness of the right leg with numbness.  Current Decadron regimen, if applicable: No  Ambulatory status? Walker? Wheelchair?: Ambulatory   SAFETY ISSUES: Prior radiation? Yes Prior Therapy:            XRT 11-14-15 Left mid fibula, XRT to left shoulder 03/20/14 to 03/31/14,XRT to   right sacral bone between 4/22 to 4/30  2014               47-39,[id number],[id number] radiotherapy to pulmonary nodules in May of 2010   Pacemaker/ICD? No  Possible current pregnancy? : No  Is the  patient on methotrexate? : No  Current Complaints / other details:   Wt Readings from Last 3 Encounters:  01/27/18 182 lb (82.6 kg)  01/22/18 185 lb 11.2 oz (84.2 kg)  12/24/17 181 lb 14.4 oz (82.5 kg)  BP 139/88 (BP Location: Right Arm, Patient Position: Sitting, Cuff Size: Normal)   Pulse 65   Temp 97.6 F (36.4 C) (Oral)   Resp 18   Ht _0  (1.905 m)   Wt 182 lb (82.6 kg)   SpO2 100%   BMI 22.75 kg/m

## 2018-01-26 NOTE — Progress Notes (Signed)
.  Radiation Oncology         [phone]) [phone] ________________________________  Outpatient Re-Consultation  Name: Keith [name] MRN: [id number]  Date of Service: 01/27/2018 DOB: February 12, 1966  WU:[id number], [name], MD  [name], MD   REFERRING PHYSICIAN: [name], MD  DIAGNOSIS:  52 yo man with painful 3.8 cm right femur metastasis from renal cell cancer - Stage IV    ICD-10-CM   1. Bone metastasis ([place]) C79.51 Ambulatory referral to Social Work   HISTORY OF PRESENT ILLNESS: Keith [name] is a 52 y.o. male seen at the request of Dr. [name]. In brief summary, he has stage IV renal cell carcinoma diagnosed in 2009 with disease to the bone as well as the lung.  He is status post laparoscopic radical nephrectomy with pathology revealing an 8.5 cm stage IIIB clear cell histology in 07/2008.  He underwent thoracotomy for asynchronous metastatic lung lesions in October 2009 and status post stereotactic radiotherapy to 3 isolated metastatic pulmonary lesions in May 2011.  He was treated with Sutent 50 mg from November 2011 to April 2014 but this was discontinued due to disease progression.  He was started on Votrient 800 mg from April 2014 through July 2016 but this too was discontinued due to disease progression.  He completed palliative radiotherapy to the right sacral bone in April 2014 and then went on to have palliative radiotherapy to the left shoulder in April 2015.  He was treated with the Nivolumab from July 2016 through September 2016 but discontinued due to disease progression.  He developed painful bony metastasis in the left mid fibula and was treated with palliative radiotherapy in November 2016.  He started Carbometryx 60 mg daily in November 2016 and has continued this daily.    His most recent systemic imaging on January 20, 2018 demonstrated disease stability.  However, he is continued with progressive pain in his right distal femur, worse when he is reclining in a chair or trying to  rest in bed at night but is able to weight-bear and ambulate without any major difficulties.  He does have increased swelling and warmth in his right knee intermittently, especially after he has been up on his feet for any period of time.  This resolves with elevation and rest.  He has not had any recent falls or injuries and has continued to attend to his normal activities of daily living without any significant decline.  He has an OnQue pain pump with Morphine and has been using Dilaudid for breakthrough pain which does provide relief but is becoming less effective.    An MRI of the right femur was obtained on January 20, 2018 which shows an irregular 3.8 x 3.1 x 3.1 cm destructive mass lesion in the anterior aspect of the intertrochanteric region of the proximal right femur with focal anterior cortical destruction as well as a 6.8 x 3.5 x 3.1 cm destructive lesion in the distal right femoral shaft just above the femoral condyles with disruption of the posterior cortex and extension of the tumor into the adjacent anterior lateral soft tissues.    We are asked to consult today to discuss the potential role of radiotherapy in helping to reduce fracture risk in the right femur as well as provide pain control.  He has also been referred for consultation with Dr. Paralee Cancel in orthopedics for consideration of prophylactic IM nailing and will see him later today at 2:30pm.   PREVIOUS RADIATION THERAPY: Yes  -11/14/2015:  The  6 cm destructive left mid fibula metastasis was treated to 8 Gy in one fraction -03/20/2014-03/31/2014 The patient's left shoulder was treated to 30 gray in 10 fractions  -04/12/2013-04/20/2013 isolated right sacral oligometastatic deposit to 50 gray in 5 fractions -May 2011 - stereotactic body radiotherapy to three isolated pulmonary metastases (1.2 cm in the right upper lobe, 5 mm in the right upper lobe and 1 cm in the right lung base) to 50 Gy in five fractions    PAST MEDICAL  HISTORY:  Past Medical History:  Diagnosis Date  . Diabetes mellitus without complication ([place])    diet controlled only  . left renal ca d'd 2008  . 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 Metastasis to bone [facility]) 2014/2016      PAST SURGICAL HISTORY: Past Surgical History:  Procedure Laterality Date  . COLONOSCOPY W/ POLYPECTOMY    . HAND SURGERY Right   . LUNG REMOVAL, PARTIAL Right 09/2008  . NEPHRECTOMY RADICAL     Left   . PAIN PUMP IMPLANTATION N/A 05/16/2015   Procedure: Intrathecal pain pump placement;  Surgeon: Clydell Hakim, MD;  Location: [facility];  Service: Neurosurgery;  Laterality: N/A;  Intrathecal pain pump placement    FAMILY HISTORY:  Family History  Problem Relation Age of Onset  . Diabetes Brother   . Irritable bowel syndrome Sister   . Colon cancer Neg Hx   . Cancer Maternal Aunt        ovarian  . Hypertension Mother     SOCIAL HISTORY:  Social History   Socioeconomic History  . Marital status: Married    Spouse name: Not on file  . Number of children: 2  . Years of education: Not on file  . Highest education level: Not on file  Social Needs  . Financial resource strain: Not on file  . Food insecurity - worry: Not on file  . Food insecurity - inability: Not on file  . Transportation needs - medical: Not on file  . Transportation needs - non-medical: Not on file  Occupational History  . Occupation: Clinical biochemist   Tobacco Use  . Smoking status: Current Every Day Smoker    Packs/day: 0.50    Years: 30.00    Pack years: 15.00    Types: Cigarettes    Last attempt to quit: 01/22/2014    Years since quitting: 4.0  . Smokeless tobacco: Never Used  Substance and Sexual Activity  . Alcohol use: No  . Drug use: Yes    Types: Marijuana    Comment: Daily. Last used: last night.   . Sexual activity: Not Currently  Other Topics Concern  . Not on file  Social History Narrative   6 caffeine drinks daily      ALLERGIES: Ceftriaxone and Hydrocodone  MEDICATIONS:  Current Outpatient Medications  Medication Sig Dispense Refill  . diphenoxylate-atropine (LOMOTIL) 2.5-0.025 MG tablet Take 1 tablet by mouth 4 (four) times daily as needed for diarrhea or loose stools. 60 tablet 0  . DULoxetine (CYMBALTA) 60 MG capsule TAKE 1 CAPSULE (60 MG TOTAL) BY MOUTH 2 (TWO) TIMES DAILY. 180 capsule 0  . gabapentin (NEURONTIN) 300 MG capsule TAKE 1 CAPSULE (300 MG TOTAL) BY MOUTH 3 (THREE) TIMES DAILY. 270 capsule 0  . HYDROmorphone (DILAUDID) 8 MG tablet Take 1 tablet (8 mg total) by mouth every 4 (four) hours as needed for moderate pain or severe pain. 50 tablet 0  . rivaroxaban (  XARELTO) 20 MG TABS tablet Take 1 tablet (20 mg total) daily with supper by mouth. 90 tablet 6  . tiZANidine (ZANAFLEX) 4 MG tablet Take 4 mg by mouth every 8 (eight) hours as needed for muscle spasms.   0  . ALPRAZolam (XANAX) 1 MG tablet TAKE 1 TABLET THREE TIMES A DAY AS NEEDED FOR ANXIETY 90 tablet 0  . cabozantinib S-Malate (CABOMETYX) 60 MG TABS Take 1 tablet by mouth daily. With at least 8 ounces of water on an empty stomach. Do not eat for 2 hours before or 1 hour after. Swallow whole. 30 tablet 0  . calcium carbonate (TUMS - DOSED IN MG ELEMENTAL CALCIUM) 500 MG chewable tablet Chew 1 tablet by mouth as needed for indigestion or heartburn.    . dextromethorphan (ROBITUSSIN MAXIMUM STRENGTH) 15 MG/5ML syrup Take 10 mLs (30 mg total) by mouth 4 (four) times daily as needed for cough. (Patient not taking: Reported on 01/27/2018) 120 mL 0  . feeding supplement, ENSURE ENLIVE, (ENSURE ENLIVE) LIQD Take 237 mLs by mouth 2 (two) times daily between meals. (Patient not taking: Reported on 01/27/2018) 237 mL 12  . ibuprofen (ADVIL,MOTRIN) 200 MG tablet Take 400 mg by mouth every 6 (six) hours as needed for fever, headache, moderate pain or cramping.     No current facility-administered medications for this encounter.     REVIEW OF SYSTEMS:   On review of systems, the patient reports that he is doing well overall. He denies any chest pain, shortness of breath, cough, fevers, chills, night sweats, unintended weight changes. He denies any bowel or bladder disturbances, and denies abdominal pain, nausea or vomiting. He denies any new musculoskeletal or joint aches or pains aside from those mentioned in the HPI related to the right leg pain. A complete review of systems is obtained and is otherwise negative.    PHYSICAL EXAM:  Wt Readings from Last 3 Encounters:  01/27/18 182 lb (82.6 kg)  01/22/18 185 lb 11.2 oz (84.2 kg)  12/24/17 181 lb 14.4 oz (82.5 kg)   Temp Readings from Last 3 Encounters:  01/27/18 97.6 F (36.4 C) (Oral)  01/22/18 97.7 F (36.5 C) (Oral)  12/24/17 97.6 F (36.4 C) (Oral)   BP Readings from Last 3 Encounters:  01/27/18 139/88  01/22/18 138/90  12/24/17 (!) 143/96   Pulse Readings from Last 3 Encounters:  01/27/18 65  01/22/18 83  12/24/17 61   Pain Assessment Pain Score: 6  Pain Loc: Leg(Right leg)/10  In general this is a well appearing Caucasian male in no acute distress.  He is alert and oriented x4 and appropriate throughout the examination. HEENT reveals that the patient is normocephalic, atraumatic. EOMs are intact. PERRLA. Skin is intact without any evidence of gross lesions. Cardiovascular exam reveals a regular rate and rhythm, no clicks rubs or murmurs are auscultated. Chest is clear to auscultation bilaterally. Lymphatic assessment is performed and does not reveal any adenopathy in the cervical, supraclavicular, axillary, or inguinal chains. Abdomen has active bowel sounds in all quadrants and is intact. The abdomen is soft, non tender, non distended. Lower extremities are negative for pretibial pitting edema, deep calf tenderness, cyanosis or clubbing.  There is currently no visible edema or erythema in the right knee.  He is tender to palpation over the right distal femur but has full ROM  with flexion and extension of the right knee.  Sensation is intact to light touch in the right and left LEs.  Strength is  4/5 in the RLE, limited by pain and 5/5 in the LLE.   KPS = 90  100 - Normal; no complaints; no evidence of disease. 90   - Able to carry on normal activity; minor signs or symptoms of disease. 80   - Normal activity with effort; some signs or symptoms of disease. 66   - Cares for self; unable to carry on normal activity or to do active work. 60   - Requires occasional assistance, but is able to care for most of his personal needs. 50   - Requires considerable assistance and frequent medical care. 8   - Disabled; requires special care and assistance. 43   - Severely disabled; hospital admission is indicated although death not imminent. 41   - Very sick; hospital admission necessary; active supportive treatment necessary. 10   - Moribund; fatal processes progressing rapidly. 0     - Dead  Karnofsky DA, Abelmann [place], Craver LS and Burchenal JH [phone]) The use of the nitrogen mustards in the palliative treatment of carcinoma: with particular reference to bronchogenic carcinoma Cancer 1 [id number]  LABORATORY DATA:  Lab Results  Component Value Date   WBC 6.4 01/20/2018   HGB 13.6 01/20/2018   HCT 43.8 01/20/2018   MCV 104.3 (H) 01/20/2018   PLT 146 01/20/2018   Lab Results  Component Value Date   NA 138 01/20/2018   K 4.5 01/20/2018   CL 104 01/20/2018   CO2 28 01/20/2018   Lab Results  Component Value Date   ALT 23 01/20/2018   AST 21 01/20/2018   ALKPHOS 108 01/20/2018   BILITOT 0.2 01/20/2018     RADIOGRAPHY: Ct Abdomen Pelvis W Wo Contrast  Result Date: 01/20/2018 CLINICAL DATA:  Left renal cell carcinoma diagnosed in 2008. Lung metastasis in 2009. Pancreatic metastasis in 2012. Oral chemotherapy in progress. Radiation therapy complete. Left nephrectomy. Right lung resection. No current complaints. Bone metastasis. EXAM: CT CHEST WITH CONTRAST CT ABDOMEN  AND PELVIS WITH AND WITHOUT CONTRAST TECHNIQUE: Multidetector CT imaging of the chest was performed during intravenous contrast administration. Multidetector CT imaging of the abdomen and pelvis was performed following the standard protocol before and during bolus administration of intravenous contrast. CONTRAST:  100 cc of Isovue-300 COMPARISON:  07/28/2017. FINDINGS: CT CHEST FINDINGS Cardiovascular: Aortic and branch vessel atherosclerosis. Normal heart size, without pericardial effusion. No central pulmonary embolism, on this non-dedicated study. Mediastinum/Nodes: No supraclavicular adenopathy. No mediastinal or hilar adenopathy. Lungs/Pleura: Right upper lobe presumed treated metastasis, including 3.4 x 1.5 cm on image 56/series 15. Compare 3.5 x 1.6 cm on the prior. Central right middle lobe pulmonary nodule measures 2.4 x 1.8 cm on image 95/series 15. Compare 2.3 x 2.1 cm on the prior. Right lower lobe surgical sutures. Pleural-based left lower lobe nodular density measures 2.3 x 1.0 cm on image 114/series 15. Compare 2.3 x 1.3 cm on the prior. Lateral left lower lobe scarring. Musculoskeletal: Dorsal spinal stimulator. Heterogeneous expansile lesion involving the posterior right fourth rib, including on image 27/series 2, similar. CT ABDOMEN AND PELVIS FINDINGS Hepatobiliary: Normal liver. Normal gallbladder, without biliary ductal dilatation. Pancreas: Again identified is moderate pancreatic duct dilatation within the body and tail. Subtle enhancing nodularity along the duct within the body is similar on image 35/series 6. Hyperenhancing lesion within the pancreatic neck measures 1.7 x 1.8 cm on image 38/series 6 and is similar 1.8 x 1.8 cm on the prior. An adjacent primarily exophytic pancreatic head lesion ventrally measures 3.2 x 2.8  cm on image 49/series 6. This is enlarged from 2.7 x 2.4 cm on the prior. No acute pancreatitis. Spleen: Normal in size, without focal abnormality. Adrenals/Urinary Tract:  Status post left nephrectomy, without locally recurrent disease. Normal right adrenal gland and kidney. Normal urinary bladder. Stomach/Bowel: Normal stomach, without wall thickening. Normal colon and terminal ileum. Normal small bowel. Vascular/Lymphatic: Aortic and branch vessel atherosclerosis. No abdominopelvic adenopathy. Reproductive: Normal prostate. Other: Trace fluid within the right hemipelvis, including on image 186/series 11. This is new, but of indeterminate etiology. Musculoskeletal: Nonspecific edema about the right pelvic wall subcutaneous fat anteriorly. This is similar on image 162/series 11. Right inferior pubic ramus lytic lesion is similar. Incompletely imaged right proximal femoral lesion with cortical involvement. Example at 3.3 cm on image 227/series 11. This is unchanged. Relatively ill-defined right sacral lytic lesion, including at 3.3 cm. IMPRESSION: CT CHEST IMPRESSION 1. Similar to slight improvement in pulmonary lesions, most consistent with treated metastasis. 2. No thoracic adenopathy or progressive disease identified in the chest. 3. Aortic atherosclerosis (ICD10-I70.0) and emphysema (ICD10-J43.9). 4. Trace left-sided pleural fluid, similar. CT ABDOMEN AND PELVIS IMPRESSION 1. Pancreatic metastasis. The dominant lesion has enlarged minimally. The smaller lesion, within the neck, is not significantly changed. 2. No new sites of disease identified. 3. Similar osseous metastasis. 4. New trace right hemi pelvic fluid, nonspecific. Electronically Signed   By: Abigail Miyamoto M.D.   On: 01/20/2018 13:41   Ct Chest W Contrast  Result Date: 01/20/2018 CLINICAL DATA:  Left renal cell carcinoma diagnosed in 2008. Lung metastasis in 2009. Pancreatic metastasis in 2012. Oral chemotherapy in progress. Radiation therapy complete. Left nephrectomy. Right lung resection. No current complaints. Bone metastasis. EXAM: CT CHEST WITH CONTRAST CT ABDOMEN AND PELVIS WITH AND WITHOUT CONTRAST TECHNIQUE:  Multidetector CT imaging of the chest was performed during intravenous contrast administration. Multidetector CT imaging of the abdomen and pelvis was performed following the standard protocol before and during bolus administration of intravenous contrast. CONTRAST:  100 cc of Isovue-300 COMPARISON:  07/28/2017. FINDINGS: CT CHEST FINDINGS Cardiovascular: Aortic and branch vessel atherosclerosis. Normal heart size, without pericardial effusion. No central pulmonary embolism, on this non-dedicated study. Mediastinum/Nodes: No supraclavicular adenopathy. No mediastinal or hilar adenopathy. Lungs/Pleura: Right upper lobe presumed treated metastasis, including 3.4 x 1.5 cm on image 56/series 15. Compare 3.5 x 1.6 cm on the prior. Central right middle lobe pulmonary nodule measures 2.4 x 1.8 cm on image 95/series 15. Compare 2.3 x 2.1 cm on the prior. Right lower lobe surgical sutures. Pleural-based left lower lobe nodular density measures 2.3 x 1.0 cm on image 114/series 15. Compare 2.3 x 1.3 cm on the prior. Lateral left lower lobe scarring. Musculoskeletal: Dorsal spinal stimulator. Heterogeneous expansile lesion involving the posterior right fourth rib, including on image 27/series 2, similar. CT ABDOMEN AND PELVIS FINDINGS Hepatobiliary: Normal liver. Normal gallbladder, without biliary ductal dilatation. Pancreas: Again identified is moderate pancreatic duct dilatation within the body and tail. Subtle enhancing nodularity along the duct within the body is similar on image 35/series 6. Hyperenhancing lesion within the pancreatic neck measures 1.7 x 1.8 cm on image 38/series 6 and is similar 1.8 x 1.8 cm on the prior. An adjacent primarily exophytic pancreatic head lesion ventrally measures 3.2 x 2.8 cm on image 49/series 6. This is enlarged from 2.7 x 2.4 cm on the prior. No acute pancreatitis. Spleen: Normal in size, without focal abnormality. Adrenals/Urinary Tract: Status post left nephrectomy, without locally  recurrent disease. Normal right adrenal gland  and kidney. Normal urinary bladder. Stomach/Bowel: Normal stomach, without wall thickening. Normal colon and terminal ileum. Normal small bowel. Vascular/Lymphatic: Aortic and branch vessel atherosclerosis. No abdominopelvic adenopathy. Reproductive: Normal prostate. Other: Trace fluid within the right hemipelvis, including on image 186/series 11. This is new, but of indeterminate etiology. Musculoskeletal: Nonspecific edema about the right pelvic wall subcutaneous fat anteriorly. This is similar on image 162/series 11. Right inferior pubic ramus lytic lesion is similar. Incompletely imaged right proximal femoral lesion with cortical involvement. Example at 3.3 cm on image 227/series 11. This is unchanged. Relatively ill-defined right sacral lytic lesion, including at 3.3 cm. IMPRESSION: CT CHEST IMPRESSION 1. Similar to slight improvement in pulmonary lesions, most consistent with treated metastasis. 2. No thoracic adenopathy or progressive disease identified in the chest. 3. Aortic atherosclerosis (ICD10-I70.0) and emphysema (ICD10-J43.9). 4. Trace left-sided pleural fluid, similar. CT ABDOMEN AND PELVIS IMPRESSION 1. Pancreatic metastasis. The dominant lesion has enlarged minimally. The smaller lesion, within the neck, is not significantly changed. 2. No new sites of disease identified. 3. Similar osseous metastasis. 4. New trace right hemi pelvic fluid, nonspecific. Electronically Signed   By: Abigail Miyamoto M.D.   On: 01/20/2018 13:41   Mr Femur Right W Wo Contrast  Result Date: 01/20/2018 CLINICAL DATA:  Metastatic renal cell carcinoma. EXAM: MRI OF THE RIGHT FEMUR WITHOUT AND WITH CONTRAST TECHNIQUE: Multiplanar, multisequence MR imaging of the right femur was performed both before and after administration of intravenous contrast. CONTRAST:  2m MULTIHANCE GADOBENATE DIMEGLUMINE 529 MG/ML IV SOLN COMPARISON:  None. FINDINGS: Bones/Joint/Cartilage There is an  irregular 3.8 x 3.1 x 3.1 cm destructive mass lesion in the anterior aspect of the intertrochanteric region of the proximal right femur with focal anterior cortical destruction. There is a 6.8 x 3.5 x 3.1 cm destructive lesion in the distal right femoral shaft just above the femoral condyles with disruption of the posterior cortex with extension of tumor into the adjacent anterolateral soft tissues. There is irregular enhancement of both masses. The patient is at risk for pathologic fractures at both sites. IMPRESSION: 1. Metastatic lesions in the proximal and distal right femur with cortical disruption at both sites putting the patient at risk for pathologic fractures. 2. There is soft tissue extension of tumor anterior laterally at the distal lesion. Electronically Signed   By: JLorriane ShireM.D.   On: 01/20/2018 14:59      IMPRESSION/PLAN: 1. 52y.o. man with stage IV renal cell carcinoma diagnosed in 2009 with disease to the bone as well as the lung.  He has progressive pain in the right femur with newly identified metastatic lesions in the proximal and distal right femur with cortical disruption at both sites putting the patient at increased risk for pathologic fracture. Today, we talked to the patient and family about the findings and workup thus far. We discussed the natural history of metastatic renal cell carcinoma with painful bony metastasis and general treatment, highlighting the role of radiotherapy in the management. We discussed the available radiation techniques, and focused on the details of logistics and delivery.  He has a consult appointment with Dr. MParalee Cancelat 2:30 PM today for consideration of prophylactic IM nailing of the right femur.  If he is felt to be a good candidate for prophylactic IM nailing, we discussed that radiation would then follow approximately 2-3 weeks postoperatively.  If prophylactic IM nailing is not felt to be appropriate or he is not felt to be a good surgical  candidate,  we would proceed with palliative radiotherapy to both sites in the proximal and distal right femur.  We reviewed the anticipated acute and late sequelae associated with radiation in this setting. The patient was encouraged to ask questions that were answered to his satisfaction.  We will plan to follow-up with him later this week regarding whether he is to be scheduled for prophylactic IM nailing of the right femur and the timing of this.  Based on those findings, we will proceed with coordinating CT simulation accordingly.    Nicholos Johns, PA-C    Tyler Pita, MD  [facility] Direct Dial: [phone]  Fax: [phone] [email or web address]  Skype  LinkedIn

## 2018-01-27 ENCOUNTER — Telehealth: Payer: Self-pay | Admitting: General Practice

## 2018-01-27 ENCOUNTER — Ambulatory Visit
Admission: RE | Admit: 2018-01-27 | Discharge: 2018-01-27 | Disposition: A | Discharge status: Home / Self Care | Payer: Medicare Other | Source: Ambulatory Visit | Attending: Urology | Admitting: Urology

## 2018-01-27 ENCOUNTER — Ambulatory Visit
Admission: RE | Admit: 2018-01-27 | Discharge: 2018-01-27 | Disposition: A | Discharge status: Home / Self Care | Payer: Medicare Other | Source: Ambulatory Visit | Attending: Radiation Oncology | Admitting: Radiation Oncology

## 2018-01-27 ENCOUNTER — Encounter: Payer: Self-pay | Admitting: Urology

## 2018-01-27 VITALS — BP 139/88 | HR 65 | Temp 97.6°F | Resp 18 | Ht 75.0 in | Wt 182.0 lb

## 2018-01-27 DIAGNOSIS — C7889 Secondary malignant neoplasm of other digestive organs: Secondary | ICD-10-CM | POA: Insufficient documentation

## 2018-01-27 DIAGNOSIS — Z800 Family history of malignant neoplasm of digestive organs: Secondary | ICD-10-CM | POA: Insufficient documentation

## 2018-01-27 DIAGNOSIS — Z885 Allergy status to narcotic agent status: Secondary | ICD-10-CM | POA: Diagnosis not present

## 2018-01-27 DIAGNOSIS — Z79899 Other long term (current) drug therapy: Secondary | ICD-10-CM | POA: Diagnosis not present

## 2018-01-27 DIAGNOSIS — Z9889 Other specified postprocedural states: Secondary | ICD-10-CM | POA: Insufficient documentation

## 2018-01-27 DIAGNOSIS — Z8379 Family history of other diseases of the digestive system: Secondary | ICD-10-CM | POA: Diagnosis not present

## 2018-01-27 DIAGNOSIS — Z79891 Long term (current) use of opiate analgesic: Secondary | ICD-10-CM | POA: Diagnosis not present

## 2018-01-27 DIAGNOSIS — C642 Malignant neoplasm of left kidney, except renal pelvis: Secondary | ICD-10-CM | POA: Insufficient documentation

## 2018-01-27 DIAGNOSIS — S728X1A Other fracture of right femur, initial encounter for closed fracture: Secondary | ICD-10-CM | POA: Diagnosis not present

## 2018-01-27 DIAGNOSIS — Z833 Family history of diabetes mellitus: Secondary | ICD-10-CM | POA: Diagnosis not present

## 2018-01-27 DIAGNOSIS — Z8041 Family history of malignant neoplasm of ovary: Secondary | ICD-10-CM | POA: Diagnosis not present

## 2018-01-27 DIAGNOSIS — Z85528 Personal history of other malignant neoplasm of kidney: Secondary | ICD-10-CM | POA: Diagnosis not present

## 2018-01-27 DIAGNOSIS — C7951 Secondary malignant neoplasm of bone: Secondary | ICD-10-CM

## 2018-01-27 DIAGNOSIS — Z923 Personal history of irradiation: Secondary | ICD-10-CM | POA: Diagnosis not present

## 2018-01-27 DIAGNOSIS — F17210 Nicotine dependence, cigarettes, uncomplicated: Secondary | ICD-10-CM | POA: Insufficient documentation

## 2018-01-27 DIAGNOSIS — Z905 Acquired absence of kidney: Secondary | ICD-10-CM | POA: Insufficient documentation

## 2018-01-27 DIAGNOSIS — M898X5 Other specified disorders of bone, thigh: Secondary | ICD-10-CM | POA: Insufficient documentation

## 2018-01-27 DIAGNOSIS — Z881 Allergy status to other antibiotic agents status: Secondary | ICD-10-CM | POA: Insufficient documentation

## 2018-01-27 DIAGNOSIS — G893 Neoplasm related pain (acute) (chronic): Secondary | ICD-10-CM | POA: Insufficient documentation

## 2018-01-27 DIAGNOSIS — C7800 Secondary malignant neoplasm of unspecified lung: Secondary | ICD-10-CM | POA: Insufficient documentation

## 2018-01-27 DIAGNOSIS — S7291XA Unspecified fracture of right femur, initial encounter for closed fracture: Secondary | ICD-10-CM | POA: Insufficient documentation

## 2018-01-27 DIAGNOSIS — Z791 Long term (current) use of non-steroidal anti-inflammatories (NSAID): Secondary | ICD-10-CM | POA: Insufficient documentation

## 2018-01-27 DIAGNOSIS — E119 Type 2 diabetes mellitus without complications: Secondary | ICD-10-CM | POA: Diagnosis not present

## 2018-01-27 DIAGNOSIS — Z85118 Personal history of other malignant neoplasm of bronchus and lung: Secondary | ICD-10-CM | POA: Diagnosis not present

## 2018-01-27 DIAGNOSIS — Z8249 Family history of ischemic heart disease and other diseases of the circulatory system: Secondary | ICD-10-CM | POA: Insufficient documentation

## 2018-01-27 DIAGNOSIS — Z85830 Personal history of malignant neoplasm of bone: Secondary | ICD-10-CM | POA: Diagnosis not present

## 2018-01-27 DIAGNOSIS — M7989 Other specified soft tissue disorders: Secondary | ICD-10-CM | POA: Insufficient documentation

## 2018-01-27 NOTE — Telephone Encounter (Signed)
[place] Psychosocial Distress Screening Clinical Social Work  Clinical Social Work was referred by distress screening protocol.  The patient scored a 5 on the Psychosocial Distress Thermometer which indicates moderate distress. Clinical Social Worker Edwyna Shell to assess for distress and other psychosocial needs. Unable to reach patient, left VM requesting call back.  Will recontact tomorrow to assess for needs.   ONCBCN DISTRESS SCREENING 01/27/2018  Screening Type Change in Status  Distress experienced in past week (1-10) 5  Emotional problem type Nervousness/Anxiety;Adjusting to illness  Physical Problem type Pain  Physician notified of physical symptoms Yes    Clinical Social Worker follow up needed: Yes.    If yes, follow up plan:  recontact tomorrow  Edwyna Shell, [place] Worker Phone:  [phone]

## 2018-01-28 ENCOUNTER — Encounter: Payer: Self-pay | Admitting: General Practice

## 2018-01-28 NOTE — Progress Notes (Signed)
[facility] CSW Progress Notes  CSW spoke w patient to review Distress Screen and feelings about yesterday's clinic visit, "I got some bad news yesterday, its in my bones."  Is awaiting more information and scheduling of next phase of treatment.  Voices gratitude for "ten years that I've been living w this thing, if I wake up tomorrow, I am grateful."  "I was bad, but my buddy came to pick me up and we are out fishing."  Requested CSW call his wife "she really needs some support, it has hit her hard."  Left VM at [phone] w CSW contact information and encouraged wife to call back if desired.  Edwyna Shell, LCSW Clinical Social Worker Phone:  [phone]

## 2018-01-29 ENCOUNTER — Other Ambulatory Visit: Payer: Self-pay | Admitting: *Deleted

## 2018-01-29 DIAGNOSIS — C649 Malignant neoplasm of unspecified kidney, except renal pelvis: Secondary | ICD-10-CM

## 2018-01-29 DIAGNOSIS — F419 Anxiety disorder, unspecified: Secondary | ICD-10-CM

## 2018-01-29 DIAGNOSIS — C7951 Secondary malignant neoplasm of bone: Secondary | ICD-10-CM

## 2018-01-29 DIAGNOSIS — K611 Rectal abscess: Secondary | ICD-10-CM

## 2018-01-29 DIAGNOSIS — K8689 Other specified diseases of pancreas: Secondary | ICD-10-CM

## 2018-01-29 MED ORDER — ALPRAZOLAM 1 MG PO TABS: ORAL_TABLET | ORAL | 0 refills | Status: DC

## 2018-02-01 ENCOUNTER — Other Ambulatory Visit (HOSPITAL_COMMUNITY): Payer: Self-pay | Admitting: Orthopedic Surgery

## 2018-02-01 ENCOUNTER — Other Ambulatory Visit (HOSPITAL_COMMUNITY): Payer: Self-pay | Admitting: Interventional Radiology

## 2018-02-01 ENCOUNTER — Other Ambulatory Visit: Payer: Self-pay | Admitting: *Deleted

## 2018-02-01 DIAGNOSIS — C649 Malignant neoplasm of unspecified kidney, except renal pelvis: Secondary | ICD-10-CM

## 2018-02-01 MED ORDER — CABOZANTINIB S-MALATE 60 MG PO TABS: 1.0000 | ORAL_TABLET | Freq: Every day | ORAL | 0 refills | Status: DC

## 2018-02-03 ENCOUNTER — Encounter: Payer: Self-pay | Admitting: Urology

## 2018-02-03 ENCOUNTER — Ambulatory Visit: Payer: Medicare Other | Admitting: Radiation Oncology

## 2018-02-03 ENCOUNTER — Telehealth: Payer: Self-pay | Admitting: *Deleted

## 2018-02-03 NOTE — Telephone Encounter (Signed)
Called patient to inform of fu on 03-03-18 @ 9:30 am with Ashlyn Bruning and his sim on 03-03-18 @ 10 am, spoke with patient and he is aware of these appts.

## 2018-02-03 NOTE — Progress Notes (Signed)
I spoke with the patient's wife this morning regarding follow-up from his recent consult visit with Dr. Paralee Cancel on 01/26/2018.  She reports that Dr. Alvan Dame is planning to do an embolization procedure on February 15, 2018 followed by prophylactic IM nailing on 02/16/2018.  We discussed the plan to follow-up in the office approximately 2 weeks after his surgery and we will plan for a simulation that same day in anticipation of beginning adjuvant radiotherapy approximately 3 weeks postoperatively.  She indicates a good understanding of the recommendations and the plan at this point and is in agreement.  She will share this information with her husband, Keith [name].  Dr. Tammi Klippel subsequently spoke over the phone with Dr. Alvan Dame and confirmed this plan.   Nicholos Johns, PA-C

## 2018-02-04 ENCOUNTER — Ambulatory Visit (HOSPITAL_COMMUNITY)
Admission: RE | Admit: 2018-02-04 | Discharge: 2018-02-04 | Disposition: A | Discharge status: Home / Self Care | Payer: Medicare Other | Source: Ambulatory Visit | Attending: Interventional Radiology | Admitting: Interventional Radiology

## 2018-02-04 DIAGNOSIS — C7801 Secondary malignant neoplasm of right lung: Secondary | ICD-10-CM | POA: Insufficient documentation

## 2018-02-04 DIAGNOSIS — C7951 Secondary malignant neoplasm of bone: Secondary | ICD-10-CM | POA: Insufficient documentation

## 2018-02-04 DIAGNOSIS — C7889 Secondary malignant neoplasm of other digestive organs: Secondary | ICD-10-CM | POA: Insufficient documentation

## 2018-02-04 DIAGNOSIS — C649 Malignant neoplasm of unspecified kidney, except renal pelvis: Secondary | ICD-10-CM | POA: Diagnosis present

## 2018-02-04 DIAGNOSIS — C7802 Secondary malignant neoplasm of left lung: Secondary | ICD-10-CM | POA: Insufficient documentation

## 2018-02-04 DIAGNOSIS — Z85528 Personal history of other malignant neoplasm of kidney: Secondary | ICD-10-CM | POA: Insufficient documentation

## 2018-02-04 DIAGNOSIS — Z905 Acquired absence of kidney: Secondary | ICD-10-CM | POA: Insufficient documentation

## 2018-02-04 DIAGNOSIS — I708 Atherosclerosis of other arteries: Secondary | ICD-10-CM | POA: Diagnosis not present

## 2018-02-04 DIAGNOSIS — I700 Atherosclerosis of aorta: Secondary | ICD-10-CM | POA: Diagnosis not present

## 2018-02-04 MED ORDER — IOPAMIDOL (ISOVUE-370) INJECTION 76%
100.0000 mL | Freq: Once | INTRAVENOUS | Status: AC | PRN
  Administered 2018-02-04: 100 mL via INTRAVENOUS

## 2018-02-04 MED ORDER — IOPAMIDOL (ISOVUE-370) INJECTION 76%
INTRAVENOUS | Status: AC
  Filled 2018-02-04: qty 100

## 2018-02-14 ENCOUNTER — Other Ambulatory Visit: Payer: Self-pay | Admitting: Radiology

## 2018-02-15 ENCOUNTER — Other Ambulatory Visit (HOSPITAL_COMMUNITY): Payer: Self-pay | Admitting: Orthopedic Surgery

## 2018-02-15 ENCOUNTER — Ambulatory Visit (HOSPITAL_COMMUNITY)
Admission: RE | Admit: 2018-02-15 | Discharge: 2018-02-15 | Disposition: A | Discharge status: Home / Self Care | Payer: Medicare Other | Source: Ambulatory Visit | Attending: Orthopedic Surgery | Admitting: Orthopedic Surgery

## 2018-02-15 ENCOUNTER — Other Ambulatory Visit: Payer: Self-pay

## 2018-02-15 ENCOUNTER — Inpatient Hospital Stay (HOSPITAL_COMMUNITY)
Admission: AD | Admit: 2018-02-15 | Discharge: 2018-02-17 | DRG: 481 | Disposition: A | Discharge status: Home / Self Care | Payer: Medicare Other | Source: Ambulatory Visit | Attending: Orthopedic Surgery | Admitting: Orthopedic Surgery

## 2018-02-15 ENCOUNTER — Encounter (HOSPITAL_COMMUNITY): Payer: Self-pay | Admitting: Interventional Radiology

## 2018-02-15 DIAGNOSIS — Z79899 Other long term (current) drug therapy: Secondary | ICD-10-CM | POA: Diagnosis not present

## 2018-02-15 DIAGNOSIS — Z85528 Personal history of other malignant neoplasm of kidney: Secondary | ICD-10-CM | POA: Diagnosis not present

## 2018-02-15 DIAGNOSIS — M85851 Other specified disorders of bone density and structure, right thigh: Secondary | ICD-10-CM | POA: Diagnosis not present

## 2018-02-15 DIAGNOSIS — C649 Malignant neoplasm of unspecified kidney, except renal pelvis: Secondary | ICD-10-CM

## 2018-02-15 DIAGNOSIS — K8681 Exocrine pancreatic insufficiency: Secondary | ICD-10-CM | POA: Diagnosis present

## 2018-02-15 DIAGNOSIS — Z888 Allergy status to other drugs, medicaments and biological substances status: Secondary | ICD-10-CM

## 2018-02-15 DIAGNOSIS — Z905 Acquired absence of kidney: Secondary | ICD-10-CM | POA: Diagnosis not present

## 2018-02-15 DIAGNOSIS — E119 Type 2 diabetes mellitus without complications: Secondary | ICD-10-CM | POA: Diagnosis not present

## 2018-02-15 DIAGNOSIS — C7889 Secondary malignant neoplasm of other digestive organs: Secondary | ICD-10-CM | POA: Diagnosis present

## 2018-02-15 DIAGNOSIS — C3490 Malignant neoplasm of unspecified part of unspecified bronchus or lung: Secondary | ICD-10-CM | POA: Diagnosis not present

## 2018-02-15 DIAGNOSIS — M84451A Pathological fracture, right femur, initial encounter for fracture: Secondary | ICD-10-CM | POA: Diagnosis not present

## 2018-02-15 DIAGNOSIS — C799 Secondary malignant neoplasm of unspecified site: Secondary | ICD-10-CM | POA: Diagnosis not present

## 2018-02-15 DIAGNOSIS — C641 Malignant neoplasm of right kidney, except renal pelvis: Secondary | ICD-10-CM | POA: Diagnosis present

## 2018-02-15 DIAGNOSIS — M25551 Pain in right hip: Secondary | ICD-10-CM | POA: Diagnosis present

## 2018-02-15 DIAGNOSIS — Z880 Allergy status to penicillin: Secondary | ICD-10-CM | POA: Diagnosis not present

## 2018-02-15 DIAGNOSIS — F17210 Nicotine dependence, cigarettes, uncomplicated: Secondary | ICD-10-CM | POA: Diagnosis present

## 2018-02-15 DIAGNOSIS — I10 Essential (primary) hypertension: Secondary | ICD-10-CM | POA: Diagnosis not present

## 2018-02-15 DIAGNOSIS — C7951 Secondary malignant neoplasm of bone: Principal | ICD-10-CM | POA: Diagnosis present

## 2018-02-15 DIAGNOSIS — S7291XA Unspecified fracture of right femur, initial encounter for closed fracture: Secondary | ICD-10-CM

## 2018-02-15 DIAGNOSIS — C801 Malignant (primary) neoplasm, unspecified: Secondary | ICD-10-CM | POA: Diagnosis not present

## 2018-02-15 DIAGNOSIS — M84651A Pathological fracture in other disease, right femur, initial encounter for fracture: Secondary | ICD-10-CM | POA: Diagnosis not present

## 2018-02-15 DIAGNOSIS — J9601 Acute respiratory failure with hypoxia: Secondary | ICD-10-CM | POA: Diagnosis not present

## 2018-02-15 HISTORY — PX: IR ANGIOGRAM EXTREMITY RIGHT: IMG652

## 2018-02-15 HISTORY — PX: IR US GUIDE VASC ACCESS LEFT: IMG2389

## 2018-02-15 HISTORY — PX: IR ANGIOGRAM SELECTIVE EACH ADDITIONAL VESSEL: IMG667

## 2018-02-15 HISTORY — PX: IR EMBO TUMOR ORGAN ISCHEMIA INFARCT INC GUIDE ROADMAPPING: IMG5449

## 2018-02-15 LAB — COMPREHENSIVE METABOLIC PANEL
ALT: 31 U/L (ref 17–63)
AST: 26 U/L (ref 15–41)
Albumin: 4.1 g/dL (ref 3.5–5.0)
Alkaline Phosphatase: 109 U/L (ref 38–126)
Anion gap: 10 (ref 5–15)
BUN: 12 mg/dL (ref 6–20)
CO2: 25 mmol/L (ref 22–32)
Calcium: 9 mg/dL (ref 8.9–10.3)
Chloride: 103 mmol/L (ref 101–111)
Creatinine, Ser: 1.01 mg/dL (ref 0.61–1.24)
GFR calc Af Amer: >60 mL/min (ref >60)
GFR calc non Af Amer: >60 mL/min (ref >60)
Glucose, Bld: 107 mg/dL — ABNORMAL HIGH (ref 65–99)
Potassium: 4.2 mmol/L (ref 3.5–5.1)
Sodium: 138 mmol/L (ref 135–145)
Total Bilirubin: 0.4 mg/dL (ref 0.3–1.2)
Total Protein: 8.1 g/dL (ref 6.5–8.1)

## 2018-02-15 LAB — CBC WITH DIFFERENTIAL/PLATELET
Basophils Absolute: 0 10*3/uL (ref 0.0–0.1)
Basophils Relative: 0 %
Eosinophils Absolute: 0.2 10*3/uL (ref 0.0–0.7)
Eosinophils Relative: 3 %
HCT: 46.3 % (ref 39.0–52.0)
Hemoglobin: 14.4 g/dL (ref 13.0–17.0)
Lymphocytes Relative: 24 %
Lymphs Abs: 1.7 10*3/uL (ref 0.7–4.0)
MCH: 32.5 pg (ref 26.0–34.0)
MCHC: 31.1 g/dL (ref 30.0–36.0)
MCV: 104.5 fL — ABNORMAL HIGH (ref 78.0–100.0)
Monocytes Absolute: 0.4 10*3/uL (ref 0.1–1.0)
Monocytes Relative: 5 %
Neutro Abs: 4.9 10*3/uL (ref 1.7–7.7)
Neutrophils Relative %: 68 %
Platelets: 180 10*3/uL (ref 150–400)
RBC: 4.43 MIL/uL (ref 4.22–5.81)
RDW: 16 % — ABNORMAL HIGH (ref 11.5–15.5)
WBC: 7.2 10*3/uL (ref 4.0–10.5)

## 2018-02-15 LAB — GLUCOSE, CAPILLARY
Glucose-Capillary: 82 mg/dL (ref 65–99)
Glucose-Capillary: 73 mg/dL (ref 65–99)

## 2018-02-15 LAB — PROTIME-INR
INR: 0.93
Prothrombin Time: 12.4 seconds (ref 11.4–15.2)

## 2018-02-15 MED ORDER — FENTANYL 40 MCG/ML IV SOLN
INTRAVENOUS | Status: DC
  Administered 2018-02-15: 1000 ug via INTRAVENOUS
  Filled 2018-02-15: qty 25

## 2018-02-15 MED ORDER — SODIUM CHLORIDE 0.9 % IV SOLN
INTRAVENOUS | Status: DC
  Administered 2018-02-15: 12:00:00 via INTRAVENOUS

## 2018-02-15 MED ORDER — VANCOMYCIN HCL IN DEXTROSE 1-5 GM/200ML-% IV SOLN
1000.0000 mg | INTRAVENOUS | Status: AC
  Administered 2018-02-15: 1000 mg via INTRAVENOUS

## 2018-02-15 MED ORDER — SODIUM CHLORIDE 0.9% FLUSH: 9.0000 mL | INTRAVENOUS | Status: DC | PRN

## 2018-02-15 MED ORDER — ONDANSETRON HCL 4 MG/2ML IJ SOLN: 4.0000 mg | Freq: Four times a day (QID) | INTRAMUSCULAR | Status: DC | PRN

## 2018-02-15 MED ORDER — ONDANSETRON HCL 4 MG PO TABS
4.0000 mg | ORAL_TABLET | Freq: Four times a day (QID) | ORAL | Status: DC | PRN
  Filled 2018-02-15: qty 1

## 2018-02-15 MED ORDER — IOPAMIDOL (ISOVUE-300) INJECTION 61%
INTRAVENOUS | Status: AC
  Administered 2018-02-15: 10 mL via INTRA_ARTERIAL
  Filled 2018-02-15: qty 300

## 2018-02-15 MED ORDER — MIDAZOLAM HCL 2 MG/2ML IJ SOLN
INTRAMUSCULAR | Status: AC
  Filled 2018-02-15: qty 4

## 2018-02-15 MED ORDER — IOPAMIDOL (ISOVUE-300) INJECTION 61%
100.0000 mL | Freq: Once | INTRAVENOUS | Status: AC | PRN
  Administered 2018-02-15: 35 mL via INTRA_ARTERIAL

## 2018-02-15 MED ORDER — IOPAMIDOL (ISOVUE-300) INJECTION 61%
100.0000 mL | Freq: Once | INTRAVENOUS | Status: AC | PRN
  Administered 2018-02-15: 10 mL via INTRA_ARTERIAL

## 2018-02-15 MED ORDER — HYDROMORPHONE HCL 2 MG PO TABS
8.0000 mg | ORAL_TABLET | ORAL | Status: DC | PRN
  Administered 2018-02-16 – 2018-02-17 (×2): 8 mg via ORAL
  Filled 2018-02-15 (×2): qty 4

## 2018-02-15 MED ORDER — FENTANYL CITRATE (PF) 100 MCG/2ML IJ SOLN
INTRAMUSCULAR | Status: AC
  Filled 2018-02-15: qty 4

## 2018-02-15 MED ORDER — NALOXONE HCL 0.4 MG/ML IJ SOLN: 0.4000 mg | INTRAMUSCULAR | Status: DC | PRN

## 2018-02-15 MED ORDER — FENTANYL CITRATE (PF) 100 MCG/2ML IJ SOLN
INTRAMUSCULAR | Status: AC
  Filled 2018-02-15: qty 6

## 2018-02-15 MED ORDER — DOCUSATE SODIUM 100 MG PO CAPS
100.0000 mg | ORAL_CAPSULE | Freq: Two times a day (BID) | ORAL | Status: DC
  Administered 2018-02-16 – 2018-02-17 (×3): 100 mg via ORAL
  Filled 2018-02-15 (×3): qty 1

## 2018-02-15 MED ORDER — DIPHENHYDRAMINE HCL 12.5 MG/5ML PO ELIX
12.5000 mg | ORAL_SOLUTION | Freq: Four times a day (QID) | ORAL | Status: DC | PRN
  Filled 2018-02-15: qty 5

## 2018-02-15 MED ORDER — IOPAMIDOL (ISOVUE-300) INJECTION 61%
INTRAVENOUS | Status: AC
  Administered 2018-02-15: 72 mL via INTRA_ARTERIAL
  Filled 2018-02-15: qty 100

## 2018-02-15 MED ORDER — IOPAMIDOL (ISOVUE-300) INJECTION 61%
100.0000 mL | Freq: Once | INTRAVENOUS | Status: AC | PRN
  Administered 2018-02-15: 72 mL via INTRA_ARTERIAL

## 2018-02-15 MED ORDER — FENTANYL CITRATE (PF) 100 MCG/2ML IJ SOLN
INTRAMUSCULAR | Status: AC | PRN
  Administered 2018-02-15 (×8): 50 ug via INTRAVENOUS

## 2018-02-15 MED ORDER — MIDAZOLAM HCL 2 MG/2ML IJ SOLN
INTRAMUSCULAR | Status: AC
  Filled 2018-02-15: qty 6

## 2018-02-15 MED ORDER — VANCOMYCIN HCL IN DEXTROSE 1-5 GM/200ML-% IV SOLN
INTRAVENOUS | Status: AC
  Administered 2018-02-15: 1000 mg via INTRAVENOUS
  Filled 2018-02-15: qty 200

## 2018-02-15 MED ORDER — LIDOCAINE-EPINEPHRINE (PF) 2 %-1:200000 IJ SOLN
INTRAMUSCULAR | Status: AC
  Filled 2018-02-15: qty 20

## 2018-02-15 MED ORDER — DIPHENHYDRAMINE HCL 50 MG/ML IJ SOLN: 12.5000 mg | Freq: Four times a day (QID) | INTRAMUSCULAR | Status: DC | PRN

## 2018-02-15 MED ORDER — MIDAZOLAM HCL 2 MG/2ML IJ SOLN
INTRAMUSCULAR | Status: AC | PRN
  Administered 2018-02-15 (×8): 1 mg via INTRAVENOUS

## 2018-02-15 NOTE — Consult Note (Signed)
Chief Complaint: Patient was seen in consultation today for abdominal aortogram/right lower extremity arteriogram with bland embolization of right femur tumors   Referring Physician(s): Olin,M  Supervising Physician: Sandi Mariscal  Patient Status: [facility] - Out-pt TBA  History of Present Illness: Keith [name] is a 52 y.o. 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  He presents today for bland arterial embolization of the right femur tumors prior to definitive intramedullary rod pinning.  Past Medical History:  Diagnosis Date  . Diabetes mellitus without complication ([place])    diet controlled only  . left renal ca d'd 2008  . 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 Metastasis to bone [facility]) 2014/2016    Past Surgical History:  Procedure Laterality Date  . COLONOSCOPY W/ POLYPECTOMY    . HAND SURGERY Right   . LUNG REMOVAL, PARTIAL Right 09/2008  . NEPHRECTOMY RADICAL     Left   . PAIN PUMP IMPLANTATION N/A 05/16/2015   Procedure: Intrathecal pain pump placement;  Surgeon: Clydell Hakim, MD;  Location: [place] [facility] OR[facility];  Service: Neurosurgery;  Laterality: N/A;  Intrathecal pain pump placement    Allergies: Ceftriaxone and Hydrocodone  Medications: Prior to Admission medications   Medication Sig Start Date End Date Taking? Authorizing Provider  ALPRAZolam Duanne Moron) 1 MG tablet TAKE 1 TABLET THREE TIMES A DAY AS NEEDED FOR ANXIETY 01/29/18   [name], MD  cabozantinib S-Malate (CABOMETYX) 60 MG TABS Take 1 tablet by mouth daily. With at least 8 ounces of water on an empty stomach. Do not eat for 2 hours before or 1 hour after. Swallow whole. 02/01/18   [name], MD  calcium carbonate (TUMS - DOSED IN MG ELEMENTAL CALCIUM) 500 MG chewable tablet Chew  1 tablet by mouth as needed for indigestion or heartburn.    [provider]  dextromethorphan (ROBITUSSIN MAXIMUM STRENGTH) 15 MG/5ML syrup Take 10 mLs (30 mg total) by mouth 4 (four) times daily as needed for cough. Patient not taking: Reported on 01/27/2018 07/31/16   [name], MD  diphenoxylate-atropine (LOMOTIL) 2.5-0.025 MG tablet Take 1 tablet by mouth 4 (four) times daily as needed for diarrhea or loose stools. 12/24/17   [name], MD  DULoxetine (CYMBALTA) 60 MG capsule TAKE 1 CAPSULE (60 MG TOTAL) BY MOUTH 2 (TWO) TIMES DAILY. 12/18/17   [name], MD  feeding supplement, ENSURE ENLIVE, (ENSURE ENLIVE) LIQD Take 237 mLs by mouth 2 (two) times daily between meals. Patient not taking: Reported on 01/27/2018 11/14/15   Robbie Lis, MD  gabapentin (NEURONTIN) 300 MG capsule TAKE 1 CAPSULE (300 MG TOTAL) BY MOUTH 3 (THREE) TIMES DAILY. 12/18/17   [name], MD  HYDROmorphone (DILAUDID) 8 MG tablet Take 1 tablet (8 mg total) by mouth every 4 (four) hours as needed for moderate pain or severe pain. 01/31/16   [name], MD  ibuprofen (ADVIL,MOTRIN) 200 MG tablet Take 400 mg by mouth every 6 (six) hours as needed for fever, headache, moderate pain or cramping.    [provider]  rivaroxaban (XARELTO) 20 MG TABS tablet Take 1 tablet (20 mg total) daily with supper by mouth. 11/03/17   [name], MD  tiZANidine (ZANAFLEX) 4 MG tablet Take 4 mg  by mouth every 8 (eight) hours as needed for muscle spasms.  04/13/15   [provider]     Family History  Problem Relation Age of Onset  . Diabetes Brother   . Irritable bowel syndrome Sister   . Colon cancer Neg Hx   . Cancer Maternal Aunt        ovarian  . Hypertension Mother     Social History   Socioeconomic History  . Marital status: Married    Spouse name: Not on file  . Number of children: 2  . Years of education: Not on file  . Highest education level: Not on file  Social Needs   . Financial resource strain: Not on file  . Food insecurity - worry: Not on file  . Food insecurity - inability: Not on file  . Transportation needs - medical: Not on file  . Transportation needs - non-medical: Not on file  Occupational History  . Occupation: Clinical biochemist   Tobacco Use  . Smoking status: Current Every Day Smoker    Packs/day: 0.50    Years: 30.00    Pack years: 15.00    Types: Cigarettes    Last attempt to quit: 01/22/2014    Years since quitting: 4.0  . Smokeless tobacco: Never Used  Substance and Sexual Activity  . Alcohol use: No  . Drug use: Yes    Types: Marijuana    Comment: Daily. Last used: last night.   . Sexual activity: Not Currently  Other Topics Concern  . Not on file  Social History Narrative   6 caffeine drinks daily       Review of Systems currently denies fever, headache, chest pain, dyspnea, cough, abdominal pain, back pain, nausea, vomiting or bleeding.  He does have sig right lower extremity pain as well as paresthesias.  Vital Signs: Blood pressure 143/108, heart rate 65, respirations 18, temp 98.1, O2 sat 100% room air    Physical Exam awake, alert.  Chest clear to auscultation bilaterally.  Heart with regular rate and rhythm.  Abdomen soft, positive bowel sounds, nontender.  Left lower abdominal pain pump intact; no lower extremity edema.  Palpable tenderness noted along right lower extremity and left tibial region.  Imaging: Ct Abdomen Pelvis W Wo Contrast  Result Date: 01/20/2018 CLINICAL DATA:  Left renal cell carcinoma diagnosed in 2008. Lung metastasis in 2009. Pancreatic metastasis in 2012. Oral chemotherapy in progress. Radiation therapy complete. Left nephrectomy. Right lung resection. No current complaints. Bone metastasis. EXAM: CT CHEST WITH CONTRAST CT ABDOMEN AND PELVIS WITH AND WITHOUT CONTRAST TECHNIQUE: Multidetector CT imaging of the chest was performed during intravenous contrast administration. Multidetector CT imaging  of the abdomen and pelvis was performed following the standard protocol before and during bolus administration of intravenous contrast. CONTRAST:  100 cc of Isovue-300 COMPARISON:  07/28/2017. FINDINGS: CT CHEST FINDINGS Cardiovascular: Aortic and branch vessel atherosclerosis. Normal heart size, without pericardial effusion. No central pulmonary embolism, on this non-dedicated study. Mediastinum/Nodes: No supraclavicular adenopathy. No mediastinal or hilar adenopathy. Lungs/Pleura: Right upper lobe presumed treated metastasis, including 3.4 x 1.5 cm on image 56/series 15. Compare 3.5 x 1.6 cm on the prior. Central right middle lobe pulmonary nodule measures 2.4 x 1.8 cm on image 95/series 15. Compare 2.3 x 2.1 cm on the prior. Right lower lobe surgical sutures. Pleural-based left lower lobe nodular density measures 2.3 x 1.0 cm on image 114/series 15. Compare 2.3 x 1.3 cm on the prior. Lateral left lower lobe scarring. Musculoskeletal:  Dorsal spinal stimulator. Heterogeneous expansile lesion involving the posterior right fourth rib, including on image 27/series 2, similar. CT ABDOMEN AND PELVIS FINDINGS Hepatobiliary: Normal liver. Normal gallbladder, without biliary ductal dilatation. Pancreas: Again identified is moderate pancreatic duct dilatation within the body and tail. Subtle enhancing nodularity along the duct within the body is similar on image 35/series 6. Hyperenhancing lesion within the pancreatic neck measures 1.7 x 1.8 cm on image 38/series 6 and is similar 1.8 x 1.8 cm on the prior. An adjacent primarily exophytic pancreatic head lesion ventrally measures 3.2 x 2.8 cm on image 49/series 6. This is enlarged from 2.7 x 2.4 cm on the prior. No acute pancreatitis. Spleen: Normal in size, without focal abnormality. Adrenals/Urinary Tract: Status post left nephrectomy, without locally recurrent disease. Normal right adrenal gland and kidney. Normal urinary bladder. Stomach/Bowel: Normal stomach, without  wall thickening. Normal colon and terminal ileum. Normal small bowel. Vascular/Lymphatic: Aortic and branch vessel atherosclerosis. No abdominopelvic adenopathy. Reproductive: Normal prostate. Other: Trace fluid within the right hemipelvis, including on image 186/series 11. This is new, but of indeterminate etiology. Musculoskeletal: Nonspecific edema about the right pelvic wall subcutaneous fat anteriorly. This is similar on image 162/series 11. Right inferior pubic ramus lytic lesion is similar. Incompletely imaged right proximal femoral lesion with cortical involvement. Example at 3.3 cm on image 227/series 11. This is unchanged. Relatively ill-defined right sacral lytic lesion, including at 3.3 cm. IMPRESSION: CT CHEST IMPRESSION 1. Similar to slight improvement in pulmonary lesions, most consistent with treated metastasis. 2. No thoracic adenopathy or progressive disease identified in the chest. 3. Aortic atherosclerosis (ICD10-I70.0) and emphysema (ICD10-J43.9). 4. Trace left-sided pleural fluid, similar. CT ABDOMEN AND PELVIS IMPRESSION 1. Pancreatic metastasis. The dominant lesion has enlarged minimally. The smaller lesion, within the neck, is not significantly changed. 2. No new sites of disease identified. 3. Similar osseous metastasis. 4. New trace right hemi pelvic fluid, nonspecific. Electronically Signed   By: Abigail Miyamoto M.D.   On: 01/20/2018 13:41   Ct Chest W Contrast  Result Date: 01/20/2018 CLINICAL DATA:  Left renal cell carcinoma diagnosed in 2008. Lung metastasis in 2009. Pancreatic metastasis in 2012. Oral chemotherapy in progress. Radiation therapy complete. Left nephrectomy. Right lung resection. No current complaints. Bone metastasis. EXAM: CT CHEST WITH CONTRAST CT ABDOMEN AND PELVIS WITH AND WITHOUT CONTRAST TECHNIQUE: Multidetector CT imaging of the chest was performed during intravenous contrast administration. Multidetector CT imaging of the abdomen and pelvis was performed  following the standard protocol before and during bolus administration of intravenous contrast. CONTRAST:  100 cc of Isovue-300 COMPARISON:  07/28/2017. FINDINGS: CT CHEST FINDINGS Cardiovascular: Aortic and branch vessel atherosclerosis. Normal heart size, without pericardial effusion. No central pulmonary embolism, on this non-dedicated study. Mediastinum/Nodes: No supraclavicular adenopathy. No mediastinal or hilar adenopathy. Lungs/Pleura: Right upper lobe presumed treated metastasis, including 3.4 x 1.5 cm on image 56/series 15. Compare 3.5 x 1.6 cm on the prior. Central right middle lobe pulmonary nodule measures 2.4 x 1.8 cm on image 95/series 15. Compare 2.3 x 2.1 cm on the prior. Right lower lobe surgical sutures. Pleural-based left lower lobe nodular density measures 2.3 x 1.0 cm on image 114/series 15. Compare 2.3 x 1.3 cm on the prior. Lateral left lower lobe scarring. Musculoskeletal: Dorsal spinal stimulator. Heterogeneous expansile lesion involving the posterior right fourth rib, including on image 27/series 2, similar. CT ABDOMEN AND PELVIS FINDINGS Hepatobiliary: Normal liver. Normal gallbladder, without biliary ductal dilatation. Pancreas: Again identified is moderate pancreatic duct dilatation within  the body and tail. Subtle enhancing nodularity along the duct within the body is similar on image 35/series 6. Hyperenhancing lesion within the pancreatic neck measures 1.7 x 1.8 cm on image 38/series 6 and is similar 1.8 x 1.8 cm on the prior. An adjacent primarily exophytic pancreatic head lesion ventrally measures 3.2 x 2.8 cm on image 49/series 6. This is enlarged from 2.7 x 2.4 cm on the prior. No acute pancreatitis. Spleen: Normal in size, without focal abnormality. Adrenals/Urinary Tract: Status post left nephrectomy, without locally recurrent disease. Normal right adrenal gland and kidney. Normal urinary bladder. Stomach/Bowel: Normal stomach, without wall thickening. Normal colon and terminal  ileum. Normal small bowel. Vascular/Lymphatic: Aortic and branch vessel atherosclerosis. No abdominopelvic adenopathy. Reproductive: Normal prostate. Other: Trace fluid within the right hemipelvis, including on image 186/series 11. This is new, but of indeterminate etiology. Musculoskeletal: Nonspecific edema about the right pelvic wall subcutaneous fat anteriorly. This is similar on image 162/series 11. Right inferior pubic ramus lytic lesion is similar. Incompletely imaged right proximal femoral lesion with cortical involvement. Example at 3.3 cm on image 227/series 11. This is unchanged. Relatively ill-defined right sacral lytic lesion, including at 3.3 cm. IMPRESSION: CT CHEST IMPRESSION 1. Similar to slight improvement in pulmonary lesions, most consistent with treated metastasis. 2. No thoracic adenopathy or progressive disease identified in the chest. 3. Aortic atherosclerosis (ICD10-I70.0) and emphysema (ICD10-J43.9). 4. Trace left-sided pleural fluid, similar. CT ABDOMEN AND PELVIS IMPRESSION 1. Pancreatic metastasis. The dominant lesion has enlarged minimally. The smaller lesion, within the neck, is not significantly changed. 2. No new sites of disease identified. 3. Similar osseous metastasis. 4. New trace right hemi pelvic fluid, nonspecific. Electronically Signed   By: Abigail Miyamoto M.D.   On: 01/20/2018 13:41   Ct Angio Ao+bifem W & Or Wo Contrast  Result Date: 02/09/2018 CLINICAL DATA:  History of metastatic renal cell carcinoma, now with lesions involving the right femur. Please perform CT angiogram to evaluate for arterial supply prior to bland embolization prior to definitive intramedullary rod pinning. EXAM: CT ANGIOGRAPHY OF ABDOMINAL AORTA WITH ILIOFEMORAL RUNOFF TECHNIQUE: Multidetector CT imaging of the abdomen, pelvis and lower extremities was performed using the standard protocol during bolus administration of intravenous contrast. Multiplanar CT image reconstructions and MIPs were  obtained to evaluate the vascular anatomy. CONTRAST:  146m ISOVUE-370 IOPAMIDOL (ISOVUE-370) INJECTION 76% COMPARISON:  CT of the chest, abdomen and pelvis - 01/20/2018; right femur MRI - 01/20/2018 FINDINGS: VASCULAR Aorta: Very minimal amount of mixed calcified though largely noncalcified atherosclerotic plaque with a normal caliber abdominal aorta, not resulting in hemodynamically significant stenosis. No abdominal aortic dissection or periaortic stranding. Celiac: Widely patent without hemodynamically significant narrowing. Conventional branching pattern. SMA: Widely patent without hemodynamically significant narrowing. Conventional branching pattern. The distal tributaries the SMA appear widely patent without discrete intraluminal filling defect to suggest distal embolism. Renals: The left renal artery is surgically absent. The right renal artery is widely patent without hemodynamically significant narrowing. No vessel irregularity to suggest FMD. Note is made of mild ectasia of the distal aspect of the right renal artery at the level of its bifurcation measuring approximately 0.9 cm (image 66, series 5). IMA: Widely patent without a hemodynamically significant narrowing. RIGHT Lower Extremity Inflow: There is a minimal amount of calcified atherosclerotic plaque within the right common iliac artery, not resulting in a hemodynamically significant stenosis. Note is made of an approximately 0.7 x 0.4 cm penetrating atherosclerotic ulcer in arising from the medial aspect of the right  common iliac artery (image 99, series 4) no perivascular stranding. The right internal and external iliac arteries are of normal caliber and widely patent without hemodynamically significant narrowing. Outflow: The right common, deep and superficial femoral arteries are widely patent without hemodynamically significant narrowing. The right above and below-knee popliteal arteries are widely patent without hemodynamically significant  narrowing. The lesion within the proximal aspect of the right femur appears to be supplied from a tiny branch vessel which arises directly from the origin of the right superficial femoral artery (image 34, series 11), while the lesion within the distal aspect the right femur appears to be supplied by 2 adjacent hypertrophied suprageniculate arterial collaterals (axial image 141, series 11, coronal image 159, series 12 and axial image 170, series 11; coronal image 64, series 12). The dominant lesion within right tibial plateau appears to be supplied via an infrageniculate arterial collateral (axial image 180, series 11). Runoff: Three-vessel runoff to the right lower leg and foot. Right-sided dorsalis pedis artery is patent to the level of the forefoot. LEFT Lower Extremity Inflow: The left common and external iliac arteries are widely patent without hemodynamically significant narrowing. Minimal amount of atherosclerotic plaque within a normal caliber left internal iliac artery Outflow: The left common, deep and superficial femoral arteries appear widely patent without hemodynamically significant narrowing. The left above and below-knee popliteal artery is widely patent without hemodynamically significant narrowing. Runoff: 3 vessel runoff to the left lower leg and foot, however the left anterior tibial artery is noted to occlude at the level of the left ankle mortise. A left-sided dorsalis pedis artery is not identified. Veins: The pelvic venous system and IVC appear widely patent on this arterial phase examination. Review of the MIP images confirms the above findings. NON-VASCULAR Lower chest: Limited visualization of lower thorax demonstrates grossly unchanged appearance of the slightly spiculated approximately 2.2 x 1.6 cm nodular opacity within the right middle lobe (image 1, series 10) as well as the less well-defined approximately 2.4 x 1.0 cm subpleural nodular opacity within the imaged left lower lobe  (image 13, series 10). Stable postsurgical change of the image right lower lobe. Normal heart size.  No pericardial effusion. Hepatobiliary: Normal hepatic contour. No discrete hepatic lesions. Normal appearance of the gallbladder given degree distention. No radiopaque gallstones. No intra extrahepatic bili duct dilatation. No ascites. Pancreas: Known enhancing pancreatic metastases are grossly unchanged with index lesion within mid body of the pancreas measuring approximately 2.5 x 1.7 cm (image 40, series 4), with associated downstream pancreatic ductal dilatation and atrophy. Exophytic lesion arising from the pancreatic head/neck junction is also unchanged measuring approximately 3.1 x 2.2 cm (image 49, series 4). No peripancreatic stranding. Spleen: Normal appearance of the spleen Adrenals/Urinary Tract: Stable sequela of prior left nephrectomy without evidence of locally recurrent disease. There is homogeneous enhancement of the solitary remaining right kidney. No discrete right-sided sided renal lesions. No definite renal stones this postcontrast examination. No urine obstruction or perinephric stranding. Normal appearance the bilateral adrenal glands. Normal appearance of the urinary bladder given degree distention. Stomach/Bowel: Moderate colonic stool burden without evidence of enteric obstruction. The bowel is normal in course and caliber without wall thickening. No pneumoperitoneum, pneumatosis or portal venous gas. Lymphatic: No bulky retroperitoneal, mesenteric, pelvic or inguinal lymphadenopathy. Reproductive: Normal appearance the prostate gland. No free fluid in the pelvic cul-de-sac. Other: Spinal stimulator device is imbedded within the soft tissues of the ventral aspect of the left lower abdomen. Musculoskeletal: Infiltrative lytic lesion involving the right-side  of the sacrum is grossly unchanged measuring approximately 4.0 x 3.6 cm (115, series 4). Grossly unchanged appearance of the  approximately 2.1 x 1.1 cm lytic lesion involving the right inferior pubic ramus (156, series 4). Lytic lesion involving the proximal diaphysis of the right femur is grossly unchanged measuring approximately 2.7 x 3.0 x 3.9 cm (image 42, series 11, coronal image 59, series 5). Lytic, expansile lesion involving the distal aspect of the right femoral diaphysis is grossly unchanged measuring approximately 4.3 x 2.5 x 5.6 cm (axial image 153, series 11; coronal image 48, series 12). Not seen on the right femoral MRI performed 01/20/2018 is an approximately 6.2 x 4.2 x 5.7 cm lytic lesion involving the proximal aspect of the right tibia (image 195, series 11; coronal image 52, series 12). This lesion appears to abut the subarticular surface of the tibial plateau as well as the base of the tibial spines (representative image 50, series 12). Note is made of an approximately 3.0 x 2.1 x 4.8 cm lytic expansile lesion involving the left fibula (axial image 240, series 11, coronal image 60, series 12). IMPRESSION: VASCULAR 1. Very minimal amount of atherosclerotic plaque within a normal caliber abdominal aorta, not resulting in a hemodynamically significant stenosis. Aortic Atherosclerosis (ICD10-I70.0). 2. Arterial supply involving the right femoral and tibial lesions as detailed above. NON-VASCULAR 1. Stable sequela of prior left-sided nephrectomy without evidence of local recurrence. 2. Osseous metastatic disease involving the sacrum, right inferior pubic ramus, right femur and tibia and the left fibula as detailed above. Note, the lesion involving the proximal aspect the right tibia appears to extend to involve the subarticular surface of the tibial plateau. 3. Grossly unchanged appearance of known metastatic disease involving the imaged bilateral lung bases as well as the pancreas as detailed above, similar to the 01/20/2018 examination. Electronically Signed   By: Sandi Mariscal M.D.   On: 02/09/2018 10:54   Mr Femur  Right W Wo Contrast  Result Date: 01/20/2018 CLINICAL DATA:  Metastatic renal cell carcinoma. EXAM: MRI OF THE RIGHT FEMUR WITHOUT AND WITH CONTRAST TECHNIQUE: Multiplanar, multisequence MR imaging of the right femur was performed both before and after administration of intravenous contrast. CONTRAST:  92m MULTIHANCE GADOBENATE DIMEGLUMINE 529 MG/ML IV SOLN COMPARISON:  None. FINDINGS: Bones/Joint/Cartilage There is an irregular 3.8 x 3.1 x 3.1 cm destructive mass lesion in the anterior aspect of the intertrochanteric region of the proximal right femur with focal anterior cortical destruction. There is a 6.8 x 3.5 x 3.1 cm destructive lesion in the distal right femoral shaft just above the femoral condyles with disruption of the posterior cortex with extension of tumor into the adjacent anterolateral soft tissues. There is irregular enhancement of both masses. The patient is at risk for pathologic fractures at both sites. IMPRESSION: 1. Metastatic lesions in the proximal and distal right femur with cortical disruption at both sites putting the patient at risk for pathologic fractures. 2. There is soft tissue extension of tumor anterior laterally at the distal lesion. Electronically Signed   By: JLorriane ShireM.D.   On: 01/20/2018 14:59    Labs:  CBC: Recent Labs    09/25/17 1512 11/03/17 1407 12/24/17 1317 01/20/18 1001  WBC 5.7 5.2 5.3 6.4  HGB 14.2 13.6 12.4* 13.6  HCT 43.2 43.2 38.5 43.8  PLT 136* 141 131* 146    COAGS: No results for input(s): INR, APTT in the last 8760 hours.  BMP: Recent Labs    09/25/17 1513 11/03/17  1407 12/24/17 1317 01/20/18 1001  NA 140 139 142 138  K 4.6 4.2 4.3 4.5  CL  --   --   --  104  CO2 31* 28 32* 28  GLUCOSE 117 87 94 104  BUN 10.8 10.2 10.3 8  CALCIUM 9.1 8.8 8.8 8.8  CREATININE 1.0 1.0 0.9 0.96  GFRNONAA  --   --   --  >60  GFRAA  --   --   --  >60    LIVER FUNCTION TESTS: Recent Labs    09/25/17 1513 11/03/17 1407 12/24/17 1317  01/20/18 1001  BILITOT <0.22 0.24 0.25 0.2  AST 17 21 20 21   ALT 23 26 25 23   ALKPHOS 85 81 95 108  PROT 6.9 7.1 6.3* 7.1  ALBUMIN 3.2* 3.3* 3.0* 3.3*    TUMOR MARKERS: No results for input(s): AFPTM, CEA, CA199, CHROMGRNA in the last 8760 hours.  Assessment and Plan: 52 y.o. 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  He presents today for bland arterial embolization of the right femur tumors prior to definitive intramedullary rod pinning on 02/16/18.Risks and benefits of procedure were discussed with the patient including, but not limited to bleeding, infection, vascular injury or contrast induced renal failure.  This interventional procedure involves the use of X-rays and because of the nature of the planned procedure, it is possible that we will have prolonged use of X-ray fluoroscopy.  Potential radiation risks to you include (but are not limited to) the following: - A slightly elevated risk for cancer  several years later in life. This risk is typically less than 0.5% percent. This risk is low in comparison to the normal incidence of human cancer, which is 33% for women and 50% for men according to the [place]. - Radiation induced injury can include skin redness, resembling a rash, tissue breakdown / ulcers and hair loss (which can be temporary or permanent).   The likelihood of either of these occurring depends on the difficulty of the procedure and whether you are sensitive to radiation due to previous procedures, disease, or genetic conditions.   IF your procedure requires a prolonged use of radiation, you will be notified and given written instructions for further action.  It is your responsibility to monitor the irradiated area for the 2 weeks following the procedure and to  notify your physician if you are concerned that you have suffered a radiation induced injury.    All of the patient's questions were answered, patient is agreeable to proceed.  Consent signed and in chart.  Labs pending    Thank you for this interesting consult.  I greatly enjoyed meeting Matheo Rathbone and look forward to participating in their care.  A copy of this report was sent to the requesting provider on this date.  Electronically Signed: D. Rowe Robert, PA-C 02/15/2018, 11:54 AM   I spent a total of 25 minutes in face to face in clinical consultation, greater than 50% of which was counseling/coordinating care for abdominal aortogram/right femoral arteriogram with runoff/bland particle embolization of right femur tumors

## 2018-02-15 NOTE — H&P (Signed)
Keith [name] is an 52 y.o. male.    Chief Complaint:     Impending right femur fracture, metastatic lesion  HPI: Pt is a 52 y.o. male complaining of severe pain right hip and thigh(femur) pain for approximately 1 year. This pain varies in severity, but is constantly present.  He presented to the office for evaluation of a diagnosis of metastatic lesions to his proximal and distal right femur. He has a history of renal cell carcinoma initially diagnosed in 2009 as well as another carcinoma diagnosed around the same time. Recent workup had revealed lesions within the proximal femur and distal femur. He was asked to be evaluated due to increasing pain regarding his distal femur distal thigh region with the identified lesions and concern for impending pathologic fracture.  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 At this point the differential from an orthopedic perspective is not as relevant as trying to stabilize his femur to prevent fracture. Treatment for his bone lesions most likely will be palliative radiotherapy to these areas once we stabilize his bone. He has had that treatment and multiple areas otherwise.  Interventional radiology will see the patient prior to the IM nail to embolize the lesion to try to prevent significant bleeding.  High chance of significant bleeding if metastatic lesion from renal origin and safest to embolize.  Patient and wife understand and wish to proceed witht he surgery to treat for iImpending right femur fracture.  Risks, benefits and expectations were discussed with the patient.  Risks including but not limited to the risk of anesthesia, blood clots, nerve damage, blood vessel damage, failure of the prosthesis, infection and up to and including death.  Patient understand the risks, benefits and expectations and wishes to proceed with surgery.    PCP: [name], MD   PMH: Past Medical  History:  Diagnosis Date  . Diabetes mellitus without complication ([place])    diet controlled only  . left renal ca d'd 2008  . 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 Metastasis to bone [facility]) 2014/2016    PSH: Past Surgical History:  Procedure Laterality Date  . COLONOSCOPY W/ POLYPECTOMY    . HAND SURGERY Right   . LUNG REMOVAL, PARTIAL Right 09/2008  . NEPHRECTOMY RADICAL     Left   . PAIN PUMP IMPLANTATION N/A 05/16/2015   Procedure: Intrathecal pain pump placement;  Surgeon: Clydell Hakim, MD;  Location: [facility];  Service: Neurosurgery;  Laterality: N/A;  Intrathecal pain pump placement    Social History:  reports that he has been smoking cigarettes.  He has a 15.00 pack-year smoking history. he has never used smokeless tobacco. He reports that he uses drugs. Drug: Marijuana. He reports that he does not drink alcohol.  Allergies:  Allergies  Allergen Reactions  . Ceftriaxone Hives  . Hydrocodone Swelling    Medications: No current facility-administered medications for this encounter.    Current Outpatient Medications  Medication Sig Dispense Refill  . ALPRAZolam (XANAX) 1 MG tablet TAKE 1 TABLET THREE TIMES A DAY AS NEEDED FOR ANXIETY 90 tablet 0  . cabozantinib S-Malate (CABOMETYX) 60 MG TABS Take 1 tablet by mouth daily. With at least 8 ounces of water on an empty stomach. Do not eat for 2 hours before or 1 hour after. Swallow whole. 30 tablet 0  . calcium carbonate (TUMS - DOSED IN MG  ELEMENTAL CALCIUM) 500 MG chewable tablet Chew 1 tablet by mouth as needed for indigestion or heartburn.    . dextromethorphan (ROBITUSSIN MAXIMUM STRENGTH) 15 MG/5ML syrup Take 10 mLs (30 mg total) by mouth 4 (four) times daily as needed for cough. (Patient not taking: Reported on 01/27/2018) 120 mL 0  . diphenoxylate-atropine (LOMOTIL) 2.5-0.025 MG tablet Take 1 tablet by mouth 4 (four) times daily as needed for diarrhea or loose stools. 60 tablet 0    . DULoxetine (CYMBALTA) 60 MG capsule TAKE 1 CAPSULE (60 MG TOTAL) BY MOUTH 2 (TWO) TIMES DAILY. 180 capsule 0  . feeding supplement, ENSURE ENLIVE, (ENSURE ENLIVE) LIQD Take 237 mLs by mouth 2 (two) times daily between meals. (Patient not taking: Reported on 01/27/2018) 237 mL 12  . gabapentin (NEURONTIN) 300 MG capsule TAKE 1 CAPSULE (300 MG TOTAL) BY MOUTH 3 (THREE) TIMES DAILY. 270 capsule 0  . HYDROmorphone (DILAUDID) 8 MG tablet Take 1 tablet (8 mg total) by mouth every 4 (four) hours as needed for moderate pain or severe pain. 50 tablet 0  . ibuprofen (ADVIL,MOTRIN) 200 MG tablet Take 400 mg by mouth every 6 (six) hours as needed for fever, headache, moderate pain or cramping.    . rivaroxaban (XARELTO) 20 MG TABS tablet Take 1 tablet (20 mg total) daily with supper by mouth. 90 tablet 6  . tiZANidine (ZANAFLEX) 4 MG tablet Take 4 mg by mouth every 8 (eight) hours as needed for muscle spasms.   0   Facility-Administered Medications Ordered in Other Encounters  Medication Dose Route Frequency Provider Last Rate Last Dose  . 0.9 %  sodium chloride infusion   Intravenous Continuous Allred, Darrell K, PA-C 50 mL/hr at 02/15/18 1207    . fentaNYL (SUBLIMAZE) 100 MCG/2ML injection           . iopamidol (ISOVUE-300) 61 % injection           . lidocaine-EPINEPHrine (XYLOCAINE W/EPI) 2 %-1:200000 (PF) injection           . midazolam (VERSED) 2 MG/2ML injection           . vancomycin (VANCOCIN) 1-5 GM/200ML-% IVPB           . vancomycin (VANCOCIN) IVPB 1000 mg/200 mL premix  1,000 mg Intravenous to XRAY Allred, Darrell K, PA-C          Review of Systems  Constitutional: Negative.   HENT: Negative.   Eyes: Negative.   Respiratory: Negative.   Cardiovascular: Negative.   Gastrointestinal: Negative.   Genitourinary: Negative.   Musculoskeletal: Positive for joint pain.  Skin: Negative.   Neurological: Negative.   Endo/Heme/Allergies: Negative.   Psychiatric/Behavioral: The patient is  nervous/anxious.        Physical Exam  Constitutional: He is oriented to person, place, and time. He appears well-developed.  HENT:  Head: Normocephalic.  Eyes: Pupils are equal, round, and reactive to light.  Neck: Neck supple. No JVD present. No tracheal deviation present. No thyromegaly present.  Cardiovascular: Normal rate, regular rhythm and intact distal pulses.  Respiratory: Effort normal and breath sounds normal. No respiratory distress. He has no wheezes.  GI: Soft. There is no tenderness. There is no guarding.  Musculoskeletal:       Right hip: He exhibits decreased range of motion, decreased strength, tenderness and bony tenderness.       Right knee: Tenderness found.       Legs: Lymphadenopathy:    He has no cervical adenopathy.  Neurological: He is alert and oriented to person, place, and time.  Skin: Skin is warm and dry.  Psychiatric: He has a normal mood and affect.       Assessment/Plan Assessment:     Impending right femur fracture, metastatic lesion   Plan: Patient will undergo embolization of right femur lesion on 02/15/2018 and a right IM nail on 02/16/2018 per Dr. Alvan Dame at [facility]. Risks benefits and expectations were discussed with the patient. Patient understand risks, benefits and expectations and wishes to proceed.     [place] Pugh Rekita Miotke   PA-C  02/15/2018, 1:27 PM

## 2018-02-15 NOTE — Procedures (Signed)
Pre-procedure Diagnosis: Metastatic RCC Post-procedure Diagnosis: Same  Post right lower extremity arteriogram and percutaneous particle embolization of lytic lesions within the proximal and distal femur.    Complications: None Immediate  EBL: None  Keep left leg straight for 4 hrs.    SignedSandi Mariscal Pager: [phone] 02/15/2018, 4:14 PM

## 2018-02-15 NOTE — Sedation Documentation (Signed)
L groin sheath removed at this time

## 2018-02-15 NOTE — Sedation Documentation (Signed)
Patient is resting comfortably with eyes closed snoring frequently at this time

## 2018-02-16 ENCOUNTER — Inpatient Hospital Stay (HOSPITAL_COMMUNITY): Payer: Medicare Other

## 2018-02-16 ENCOUNTER — Encounter (HOSPITAL_COMMUNITY): Payer: Self-pay | Admitting: General Practice

## 2018-02-16 ENCOUNTER — Inpatient Hospital Stay (HOSPITAL_COMMUNITY): Payer: Medicare Other | Admitting: Anesthesiology

## 2018-02-16 ENCOUNTER — Encounter (HOSPITAL_COMMUNITY)
Admission: AD | Disposition: A | Discharge status: Home / Self Care | Payer: Self-pay | Source: Ambulatory Visit | Attending: Orthopedic Surgery

## 2018-02-16 DIAGNOSIS — C801 Malignant (primary) neoplasm, unspecified: Secondary | ICD-10-CM | POA: Diagnosis not present

## 2018-02-16 DIAGNOSIS — C7951 Secondary malignant neoplasm of bone: Secondary | ICD-10-CM | POA: Diagnosis not present

## 2018-02-16 HISTORY — PX: FEMUR IM NAIL: SHX1597

## 2018-02-16 LAB — BASIC METABOLIC PANEL
Anion gap: 9 (ref 5–15)
BUN: 10 mg/dL (ref 6–20)
CO2: 28 mmol/L (ref 22–32)
Calcium: 8.2 mg/dL — ABNORMAL LOW (ref 8.9–10.3)
Chloride: 105 mmol/L (ref 101–111)
Creatinine, Ser: 1.04 mg/dL (ref 0.61–1.24)
GFR calc Af Amer: >60 mL/min (ref >60)
GFR calc non Af Amer: >60 mL/min (ref >60)
Glucose, Bld: 109 mg/dL — ABNORMAL HIGH (ref 65–99)
Potassium: 4.4 mmol/L (ref 3.5–5.1)
Sodium: 142 mmol/L (ref 135–145)

## 2018-02-16 LAB — CBC
HCT: 39.9 % (ref 39.0–52.0)
Hemoglobin: 12.6 g/dL — ABNORMAL LOW (ref 13.0–17.0)
MCH: 33 pg (ref 26.0–34.0)
MCHC: 31.6 g/dL (ref 30.0–36.0)
MCV: 104.5 fL — ABNORMAL HIGH (ref 78.0–100.0)
Platelets: 148 10*3/uL — ABNORMAL LOW (ref 150–400)
RBC: 3.82 MIL/uL — ABNORMAL LOW (ref 4.22–5.81)
RDW: 16.1 % — ABNORMAL HIGH (ref 11.5–15.5)
WBC: 5.7 10*3/uL (ref 4.0–10.5)

## 2018-02-16 LAB — SURGICAL PCR SCREEN
MRSA, PCR: NEGATIVE
Staphylococcus aureus: NEGATIVE

## 2018-02-16 LAB — PREPARE RBC (CROSSMATCH)

## 2018-02-16 LAB — GLUCOSE, CAPILLARY: Glucose-Capillary: 122 mg/dL — ABNORMAL HIGH (ref 65–99)

## 2018-02-16 SURGERY — INSERTION, INTRAMEDULLARY ROD, FEMUR
Anesthesia: General | Site: Hip | Laterality: Right

## 2018-02-16 MED ORDER — ONDANSETRON HCL 4 MG/2ML IJ SOLN
INTRAMUSCULAR | Status: AC
  Filled 2018-02-16: qty 2

## 2018-02-16 MED ORDER — FENTANYL CITRATE (PF) 250 MCG/5ML IJ SOLN
INTRAMUSCULAR | Status: DC | PRN
  Administered 2018-02-16 (×5): 50 ug via INTRAVENOUS
  Administered 2018-02-16: 100 ug via INTRAVENOUS

## 2018-02-16 MED ORDER — SODIUM CHLORIDE 0.9 % IV SOLN: INTRAVENOUS | Status: DC

## 2018-02-16 MED ORDER — LIDOCAINE 2% (20 MG/ML) 5 ML SYRINGE
INTRAMUSCULAR | Status: DC | PRN
  Administered 2018-02-16: 100 mg via INTRAVENOUS

## 2018-02-16 MED ORDER — NALOXONE HCL 0.4 MG/ML IJ SOLN
0.4000 mg | INTRAMUSCULAR | Status: DC | PRN
Start: 2018-02-16 — End: 2018-02-16

## 2018-02-16 MED ORDER — SODIUM CHLORIDE 0.9 % IR SOLN
Status: DC | PRN
  Administered 2018-02-16: 1000 mL

## 2018-02-16 MED ORDER — SODIUM CHLORIDE 0.9% FLUSH: 9.0000 mL | INTRAVENOUS | Status: DC | PRN

## 2018-02-16 MED ORDER — GABAPENTIN 300 MG PO CAPS
300.0000 mg | ORAL_CAPSULE | Freq: Three times a day (TID) | ORAL | Status: DC
  Administered 2018-02-16 – 2018-02-17 (×2): 300 mg via ORAL
  Filled 2018-02-16 (×2): qty 1

## 2018-02-16 MED ORDER — KETAMINE HCL 10 MG/ML IJ SOLN
INTRAMUSCULAR | Status: DC | PRN
  Administered 2018-02-16: 50 mg via INTRAVENOUS

## 2018-02-16 MED ORDER — MAGNESIUM CITRATE PO SOLN: 1.0000 | Freq: Once | ORAL | Status: DC | PRN

## 2018-02-16 MED ORDER — HYDROMORPHONE HCL 1 MG/ML IJ SOLN: 0.5000 mg | INTRAMUSCULAR | Status: DC | PRN

## 2018-02-16 MED ORDER — STERILE WATER FOR IRRIGATION IR SOLN
Status: DC | PRN
  Administered 2018-02-16: 2000 mL

## 2018-02-16 MED ORDER — CHLORHEXIDINE GLUCONATE 4 % EX LIQD: 60.0000 mL | Freq: Once | CUTANEOUS | Status: DC

## 2018-02-16 MED ORDER — KETAMINE INJECTION FOR OPTIME (MG/KG/HR) DOCUMENTATION
INTRAVENOUS | Status: DC | PRN
  Administered 2018-02-16: .2 mg/kg/h via INTRAVENOUS

## 2018-02-16 MED ORDER — FENTANYL 40 MCG/ML IV SOLN
INTRAVENOUS | Status: DC
  Administered 2018-02-16: 30 ug via INTRAVENOUS
  Administered 2018-02-16: 135 ug via INTRAVENOUS
  Administered 2018-02-16: 210 ug via INTRAVENOUS

## 2018-02-16 MED ORDER — ONDANSETRON HCL 4 MG/2ML IJ SOLN: 4.0000 mg | Freq: Four times a day (QID) | INTRAMUSCULAR | Status: DC | PRN

## 2018-02-16 MED ORDER — ACETAMINOPHEN 500 MG PO TABS
1000.0000 mg | ORAL_TABLET | Freq: Once | ORAL | Status: AC
  Administered 2018-02-16: 1000 mg via ORAL
  Filled 2018-02-16: qty 2

## 2018-02-16 MED ORDER — METOCLOPRAMIDE HCL 5 MG/ML IJ SOLN: 5.0000 mg | Freq: Three times a day (TID) | INTRAMUSCULAR | Status: DC | PRN

## 2018-02-16 MED ORDER — METOCLOPRAMIDE HCL 5 MG PO TABS: 5.0000 mg | ORAL_TABLET | Freq: Three times a day (TID) | ORAL | Status: DC | PRN

## 2018-02-16 MED ORDER — METHOCARBAMOL 500 MG PO TABS
500.0000 mg | ORAL_TABLET | Freq: Four times a day (QID) | ORAL | Status: DC | PRN
  Administered 2018-02-17 (×2): 500 mg via ORAL
  Filled 2018-02-16 (×2): qty 1

## 2018-02-16 MED ORDER — SUCCINYLCHOLINE CHLORIDE 200 MG/10ML IV SOSY
PREFILLED_SYRINGE | INTRAVENOUS | Status: AC
  Filled 2018-02-16: qty 10

## 2018-02-16 MED ORDER — FERROUS SULFATE 325 (65 FE) MG PO TABS
325.0000 mg | ORAL_TABLET | Freq: Three times a day (TID) | ORAL | Status: DC
  Administered 2018-02-16 – 2018-02-17 (×2): 325 mg via ORAL
  Filled 2018-02-16 (×2): qty 1

## 2018-02-16 MED ORDER — ONDANSETRON HCL 4 MG/2ML IJ SOLN
INTRAMUSCULAR | Status: DC | PRN
  Administered 2018-02-16: 4 mg via INTRAVENOUS

## 2018-02-16 MED ORDER — CABOZANTINIB S-MALATE 60 MG PO TABS
60.0000 mg | ORAL_TABLET | Freq: Once | ORAL | Status: AC
  Administered 2018-02-16: 60 mg via ORAL

## 2018-02-16 MED ORDER — DEXAMETHASONE SODIUM PHOSPHATE 10 MG/ML IJ SOLN
INTRAMUSCULAR | Status: AC
  Filled 2018-02-16: qty 1

## 2018-02-16 MED ORDER — SUGAMMADEX SODIUM 200 MG/2ML IV SOLN
INTRAVENOUS | Status: DC | PRN
  Administered 2018-02-16: 200 mg via INTRAVENOUS

## 2018-02-16 MED ORDER — FENTANYL CITRATE (PF) 100 MCG/2ML IJ SOLN
INTRAMUSCULAR | Status: AC
  Filled 2018-02-16: qty 2

## 2018-02-16 MED ORDER — DEXAMETHASONE SODIUM PHOSPHATE 10 MG/ML IJ SOLN
INTRAMUSCULAR | Status: DC | PRN
  Administered 2018-02-16: 10 mg via INTRAVENOUS

## 2018-02-16 MED ORDER — CALCIUM CARBONATE ANTACID 500 MG PO CHEW: 1.0000 | CHEWABLE_TABLET | ORAL | Status: DC | PRN

## 2018-02-16 MED ORDER — SUGAMMADEX SODIUM 200 MG/2ML IV SOLN
INTRAVENOUS | Status: AC
Start: 2018-02-16 — End: ?
  Filled 2018-02-16: qty 2

## 2018-02-16 MED ORDER — FENTANYL CITRATE (PF) 250 MCG/5ML IJ SOLN
INTRAMUSCULAR | Status: AC
  Filled 2018-02-16: qty 5

## 2018-02-16 MED ORDER — KETAMINE INJECTION FOR OPTIME (MG/KG/HR) DOCUMENTATION: INTRAVENOUS | Status: DC

## 2018-02-16 MED ORDER — POLYETHYLENE GLYCOL 3350 17 G PO PACK
17.0000 g | PACK | Freq: Two times a day (BID) | ORAL | Status: DC
  Administered 2018-02-16: 17 g via ORAL
  Filled 2018-02-16 (×2): qty 1

## 2018-02-16 MED ORDER — BISACODYL 10 MG RE SUPP: 10.0000 mg | Freq: Every day | RECTAL | Status: DC | PRN

## 2018-02-16 MED ORDER — VANCOMYCIN HCL IN DEXTROSE 1-5 GM/200ML-% IV SOLN
1000.0000 mg | Freq: Two times a day (BID) | INTRAVENOUS | Status: AC
  Administered 2018-02-16: 1000 mg via INTRAVENOUS
  Filled 2018-02-16: qty 200

## 2018-02-16 MED ORDER — HYDROMORPHONE HCL 2 MG PO TABS: 8.0000 mg | ORAL_TABLET | ORAL | Status: DC | PRN

## 2018-02-16 MED ORDER — GABAPENTIN 300 MG PO CAPS
300.0000 mg | ORAL_CAPSULE | Freq: Once | ORAL | Status: AC
  Administered 2018-02-16: 300 mg via ORAL
  Filled 2018-02-16: qty 1

## 2018-02-16 MED ORDER — DULOXETINE HCL 30 MG PO CPEP
60.0000 mg | ORAL_CAPSULE | Freq: Two times a day (BID) | ORAL | Status: DC
  Administered 2018-02-16 – 2018-02-17 (×2): 60 mg via ORAL
  Filled 2018-02-16 (×2): qty 2

## 2018-02-16 MED ORDER — PROPOFOL 10 MG/ML IV BOLUS
INTRAVENOUS | Status: DC | PRN
  Administered 2018-02-16: 100 mg via INTRAVENOUS

## 2018-02-16 MED ORDER — LACTATED RINGERS IV SOLN
INTRAVENOUS | Status: DC
  Administered 2018-02-16: 14:00:00 via INTRAVENOUS

## 2018-02-16 MED ORDER — MIDAZOLAM HCL 2 MG/2ML IJ SOLN
INTRAMUSCULAR | Status: DC | PRN
  Administered 2018-02-16: 2 mg via INTRAVENOUS

## 2018-02-16 MED ORDER — HYDROMORPHONE HCL 1 MG/ML IJ SOLN
0.2500 mg | INTRAMUSCULAR | Status: DC | PRN
  Administered 2018-02-16 (×4): 0.5 mg via INTRAVENOUS

## 2018-02-16 MED ORDER — ROCURONIUM BROMIDE 10 MG/ML (PF) SYRINGE
PREFILLED_SYRINGE | INTRAVENOUS | Status: DC | PRN
  Administered 2018-02-16: 50 mg via INTRAVENOUS

## 2018-02-16 MED ORDER — SODIUM CHLORIDE 0.9 % IJ SOLN
INTRAMUSCULAR | Status: AC
  Filled 2018-02-16: qty 10

## 2018-02-16 MED ORDER — HYDROMORPHONE HCL 1 MG/ML IJ SOLN
INTRAMUSCULAR | Status: AC
  Filled 2018-02-16: qty 2

## 2018-02-16 MED ORDER — HYDROMORPHONE HCL 2 MG/ML IJ SOLN
INTRAMUSCULAR | Status: AC
  Filled 2018-02-16: qty 1

## 2018-02-16 MED ORDER — HYDROMORPHONE HCL 1 MG/ML IJ SOLN
INTRAMUSCULAR | Status: DC | PRN
  Administered 2018-02-16 (×2): 0.5 mg via INTRAVENOUS
  Administered 2018-02-16: 1 mg via INTRAVENOUS

## 2018-02-16 MED ORDER — MIDAZOLAM HCL 2 MG/2ML IJ SOLN
INTRAMUSCULAR | Status: AC
  Filled 2018-02-16: qty 2

## 2018-02-16 MED ORDER — ROCURONIUM BROMIDE 10 MG/ML (PF) SYRINGE
PREFILLED_SYRINGE | INTRAVENOUS | Status: AC
  Filled 2018-02-16: qty 5

## 2018-02-16 MED ORDER — ALPRAZOLAM 1 MG PO TABS: 1.0000 mg | ORAL_TABLET | Freq: Three times a day (TID) | ORAL | Status: DC | PRN

## 2018-02-16 MED ORDER — CEFAZOLIN SODIUM-DEXTROSE 2-4 GM/100ML-% IV SOLN: 2.0000 g | INTRAVENOUS | Status: DC

## 2018-02-16 MED ORDER — PROMETHAZINE HCL 25 MG/ML IJ SOLN: 6.2500 mg | INTRAMUSCULAR | Status: DC | PRN

## 2018-02-16 MED ORDER — RIVAROXABAN 10 MG PO TABS
10.0000 mg | ORAL_TABLET | Freq: Every day | ORAL | Status: DC
  Filled 2018-02-16: qty 1

## 2018-02-16 MED ORDER — LACTATED RINGERS IV SOLN
INTRAVENOUS | Status: DC
  Administered 2018-02-16 (×2): via INTRAVENOUS

## 2018-02-16 MED ORDER — GENTAMICIN SULFATE 40 MG/ML IJ SOLN
5.0000 mg/kg | Freq: Once | INTRAVENOUS | Status: AC
  Administered 2018-02-16: 420 mg via INTRAVENOUS
  Filled 2018-02-16: qty 10.5

## 2018-02-16 MED ORDER — PROPOFOL 10 MG/ML IV BOLUS
INTRAVENOUS | Status: AC
  Filled 2018-02-16: qty 20

## 2018-02-16 MED ORDER — FENTANYL CITRATE (PF) 100 MCG/2ML IJ SOLN
INTRAMUSCULAR | Status: AC
  Filled 2018-02-16: qty 4

## 2018-02-16 MED ORDER — DIPHENOXYLATE-ATROPINE 2.5-0.025 MG PO TABS: 1.0000 | ORAL_TABLET | Freq: Four times a day (QID) | ORAL | Status: DC | PRN

## 2018-02-16 MED ORDER — ACETAMINOPHEN 500 MG PO TABS
1000.0000 mg | ORAL_TABLET | Freq: Four times a day (QID) | ORAL | Status: DC
  Administered 2018-02-16 – 2018-02-17 (×3): 1000 mg via ORAL
  Filled 2018-02-16 (×3): qty 2

## 2018-02-16 MED ORDER — VANCOMYCIN HCL IN DEXTROSE 1-5 GM/200ML-% IV SOLN
1000.0000 mg | INTRAVENOUS | Status: AC
  Administered 2018-02-16: 1000 mg via INTRAVENOUS
  Filled 2018-02-16: qty 200

## 2018-02-16 MED ORDER — FENTANYL CITRATE (PF) 100 MCG/2ML IJ SOLN
25.0000 ug | INTRAMUSCULAR | Status: DC | PRN
  Administered 2018-02-16 (×4): 50 ug via INTRAVENOUS

## 2018-02-16 MED ORDER — KETAMINE HCL 10 MG/ML IJ SOLN
INTRAMUSCULAR | Status: AC
  Filled 2018-02-16: qty 1

## 2018-02-16 MED ORDER — METHOCARBAMOL 1000 MG/10ML IJ SOLN
500.0000 mg | Freq: Four times a day (QID) | INTRAVENOUS | Status: DC | PRN
  Administered 2018-02-16: 500 mg via INTRAVENOUS
  Filled 2018-02-16: qty 550

## 2018-02-16 MED ORDER — DIPHENHYDRAMINE HCL 50 MG/ML IJ SOLN: 12.5000 mg | Freq: Four times a day (QID) | INTRAMUSCULAR | Status: DC | PRN

## 2018-02-16 MED ORDER — DIPHENHYDRAMINE HCL 12.5 MG/5ML PO ELIX: 12.5000 mg | ORAL_SOLUTION | Freq: Four times a day (QID) | ORAL | Status: DC | PRN

## 2018-02-16 MED ORDER — PHENOL 1.4 % MT LIQD: 1.0000 | OROMUCOSAL | Status: DC | PRN

## 2018-02-16 MED ORDER — POVIDONE-IODINE 10 % EX SWAB: 2.0000 "application " | Freq: Once | CUTANEOUS | Status: DC

## 2018-02-16 MED ORDER — LIDOCAINE 2% (20 MG/ML) 5 ML SYRINGE
INTRAMUSCULAR | Status: AC
  Filled 2018-02-16: qty 5

## 2018-02-16 MED ORDER — CABOZANTINIB S-MALATE 60 MG PO TABS: 1.0000 | ORAL_TABLET | Freq: Every day | ORAL | Status: DC

## 2018-02-16 MED ORDER — MENTHOL 3 MG MT LOZG
1.0000 | LOZENGE | OROMUCOSAL | Status: DC | PRN
  Filled 2018-02-16: qty 9

## 2018-02-16 SURGICAL SUPPLY — 46 items
ADH SKN CLS APL DERMABOND .7 (GAUZE/BANDAGES/DRESSINGS) ×1
BAG SPEC THK2 15X12 ZIP CLS (MISCELLANEOUS)
BAG ZIPLOCK 12X15 (MISCELLANEOUS) IMPLANT
BIT DRILL 4.3MMS DISTAL GRDTED (BIT) IMPLANT
BNDG GAUZE ELAST 4 BULKY (GAUZE/BANDAGES/DRESSINGS) ×2 IMPLANT
COVER PERINEAL POST (MISCELLANEOUS) ×2 IMPLANT
COVER SURGICAL LIGHT HANDLE (MISCELLANEOUS) ×2 IMPLANT
DERMABOND ADVANCED (GAUZE/BANDAGES/DRESSINGS) ×1
DERMABOND ADVANCED .7 DNX12 (GAUZE/BANDAGES/DRESSINGS) IMPLANT
DRAPE INCISE IOBAN 66X45 STRL (DRAPES) ×1 IMPLANT
DRAPE STERI IOBAN 125X83 (DRAPES) ×2 IMPLANT
DRESSING AQUACEL AG SP 3.5X6 (GAUZE/BANDAGES/DRESSINGS) IMPLANT
DRILL 4.3MMS DISTAL GRADUATED (BIT) ×2
DRSG AQUACEL AG ADV 3.5X 4 (GAUZE/BANDAGES/DRESSINGS) IMPLANT
DRSG AQUACEL AG ADV 3.5X 6 (GAUZE/BANDAGES/DRESSINGS) ×3 IMPLANT
DRSG AQUACEL AG SP 3.5X6 (GAUZE/BANDAGES/DRESSINGS) ×6
DURAPREP 26ML APPLICATOR (WOUND CARE) ×2 IMPLANT
ELECT REM PT RETURN 15FT ADLT (MISCELLANEOUS) ×2 IMPLANT
GAUZE SPONGE 4X4 12PLY STRL (GAUZE/BANDAGES/DRESSINGS) IMPLANT
GLOVE BIOGEL M 7.0 STRL (GLOVE) IMPLANT
GLOVE BIOGEL PI IND STRL 7.5 (GLOVE) ×1 IMPLANT
GLOVE BIOGEL PI IND STRL 8.5 (GLOVE) IMPLANT
GLOVE BIOGEL PI INDICATOR 7.5 (GLOVE) ×1
GLOVE BIOGEL PI INDICATOR 8.5 (GLOVE)
GLOVE ECLIPSE 8.0 STRL XLNG CF (GLOVE) ×1 IMPLANT
GLOVE ORTHO TXT STRL SZ7.5 (GLOVE) ×2 IMPLANT
GOWN STRL REUS W/TWL LRG LVL3 (GOWN DISPOSABLE) ×2 IMPLANT
GOWN STRL REUS W/TWL XL LVL3 (GOWN DISPOSABLE) ×1 IMPLANT
GUIDEPIN 3.2X17.5 THRD DISP (PIN) ×1 IMPLANT
GUIDEPIN VERSANAIL DSP 3.2X444 (ORTHOPEDIC DISPOSABLE SUPPLIES) ×1 IMPLANT
GUIDEWIRE BALL NOSE 100CM (WIRE) ×1 IMPLANT
HFN RH 130 DEG 11MM X 440MM (Nail) ×1 IMPLANT
KIT BASIN OR (CUSTOM PROCEDURE TRAY) ×2 IMPLANT
MANIFOLD NEPTUNE II (INSTRUMENTS) ×2 IMPLANT
PACK GENERAL/GYN (CUSTOM PROCEDURE TRAY) ×2 IMPLANT
POSITIONER SURGICAL ARM (MISCELLANEOUS) ×2 IMPLANT
SCREW BONE CORTICAL 5.0X52 (Screw) ×1 IMPLANT
SCREW CORTICAL 5.0MMX60M (Screw) ×1 IMPLANT
SCREW LAG 10.5MMX105MM HFN (Screw) ×1 IMPLANT
SCREWDRIVER HEX TIP 3.5MM (MISCELLANEOUS) ×1 IMPLANT
SUT MNCRL AB 4-0 PS2 18 (SUTURE) ×1 IMPLANT
SUT VIC AB 1 CT1 27 (SUTURE) ×2
SUT VIC AB 1 CT1 27XBRD ANTBC (SUTURE) ×1 IMPLANT
SUT VIC AB 2-0 CT1 27 (SUTURE) ×4
SUT VIC AB 2-0 CT1 27XBRD (SUTURE) ×2 IMPLANT
TRAY CATH 16FR W/PLASTIC CATH (SET/KITS/TRAYS/PACK) ×1 IMPLANT

## 2018-02-16 NOTE — Transfer of Care (Signed)
Immediate Anesthesia Transfer of Care Note  Patient: Keith [name]  Procedure(s) Performed: INTRAMEDULLARY (IM) NAIL RIGHT FEMORAL (Right Hip)  Patient Location: PACU  Anesthesia Type:General  Level of Consciousness: awake and patient cooperative  Airway & Oxygen Therapy: Patient Spontanous Breathing and Patient connected to face mask oxygen  Post-op Assessment: Report given to RN and Post -op Vital signs reviewed and stable  Post vital signs: Reviewed and stable  Last Vitals:  Vitals:   02/16/18 1048 02/16/18 1212  BP: 128/87   Pulse: (!) 57   Resp: 15 10  Temp: 36.6 C   SpO2: 99% 98%    Last Pain:  Vitals:   02/16/18 1212  TempSrc:   PainSc: 4       Patients Stated Pain Goal: 0 (30/94/07 6808)  Complications: No apparent anesthesia complications

## 2018-02-16 NOTE — Anesthesia Postprocedure Evaluation (Signed)
Anesthesia Post Note  Patient: Education officer, environmental  Procedure(s) Performed: INTRAMEDULLARY (IM) NAIL RIGHT FEMORAL (Right Hip)     Patient location during evaluation: PACU Anesthesia Type: General Level of consciousness: awake and alert Pain management: pain level controlled Vital Signs Assessment: post-procedure vital signs reviewed and stable Respiratory status: spontaneous breathing, nonlabored ventilation, respiratory function stable and patient connected to nasal cannula oxygen Cardiovascular status: blood pressure returned to baseline and stable Postop Assessment: no apparent nausea or vomiting Anesthetic complications: no    Last Vitals:  Vitals:   02/16/18 1730 02/16/18 1758  BP: (!) 145/94 (!) 157/89  Pulse: (!) 57 (!) 54  Resp: 14 14  Temp: (!) 36.3 C 36.6 C  SpO2: 100% 100%    Last Pain:  Vitals:   02/16/18 1758  TempSrc:   PainSc: 7                  Tennyson Wacha S

## 2018-02-16 NOTE — Progress Notes (Signed)
Patient went to OR. Per OR RN, PCA needs to be held. At this time, it is unsure if patient will return to same room. Fentanyl PCA 25mcg/ml wasted in sink. Mali Q., RN witness.

## 2018-02-16 NOTE — Interval H&P Note (Signed)
History and Physical Interval Note:  02/16/2018 1:49 PM  Keith [name]  has presented today for surgery, with the diagnosis of impending right pathologic fracture  The various methods of treatment have been discussed with the patient and family. After consideration of risks, benefits and other options for treatment, the patient has consented to  Procedure(s): INTRAMEDULLARY (IM) NAIL RIGHT FEMORAL (Right) as a surgical intervention .  The patient's history has been reviewed, patient examined, no change in status, stable for surgery.  I have reviewed the patient's chart and labs.  Questions were answered to the patient's satisfaction.     Mauri Pole

## 2018-02-16 NOTE — Progress Notes (Signed)
Patient ID: Keith [name], male   DOB: [birth year], 52 y.o.   MRN: [id number]  Relatively comfortable this am status post IR procedure to try and embolized renal cell met locations  Exam: AFVSS Neutral right LE no deformity  Renal cell carcinoma with mets to right femur proximally and distally  To OR today for prophylactic IM nail due to impending pathologic femur fracture status post IR procedure Risks stressed and review Goals and procedure reviewed consent to be signed today

## 2018-02-16 NOTE — H&P (View-Only) (Signed)
Patient ID: Keith [name], male   DOB: 10/05/1966, 51 y.o.   MRN: [id number]  Relatively comfortable this am status post IR procedure to try and embolized renal cell met locations  Exam: AFVSS Neutral right LE no deformity  Renal cell carcinoma with mets to right femur proximally and distally  To OR today for prophylactic IM nail due to impending pathologic femur fracture status post IR procedure Risks stressed and review Goals and procedure reviewed consent to be signed today  

## 2018-02-16 NOTE — Op Note (Signed)
NAME:  Keith [name], Keith [name]                ACCOUNT NO.:  [id number]  MEDICAL RECORD NO.:  [id number]  LOCATION:                                 FACILITY:  PHYSICIAN:  Pietro Cassis. Alvan Dame, M.D.       DATE OF BIRTH:  DATE OF PROCEDURE:  02/16/2018 DATE OF DISCHARGE:                              OPERATIVE REPORT   PREOPERATIVE DIAGNOSIS:  Metastatic renal cell carcinoma with concern for impending pathologic femur fracture distally with secondary lesion in the proximal aspect of the femur, status post interventional radiology embolization of vascular supply to tumor.  POSTOPERATIVE DIAGNOSIS:  Metastatic renal cell carcinoma with concern for impending pathologic femur fracture distally with secondary lesion in the proximal aspect of the femur, status post interventional radiology embolization of vascular supply to tumor.  PROCEDURE:  Intramedullary nailing, right femur utilizing a Biomet Affixus 144 mm with a 130-degree lag screw measuring 105 mm and two distal interlocks, one of them went through the tumor site, the other one went distal to it.  SURGEON:  Pietro Cassis. Alvan Dame, M.D.  ASSISTANT:  Danae Orleans, PA-C.  ANESTHESIA:  General.  BLOOD LOSS:  Anywhere from 400-500 mL.  BLOOD GIVEN:  None.  COMPLICATIONS:  None apparent.  INDICATIONS FOR PROCEDURE:  Keith [name] is a pleasant 52 year old gentleman with longstanding history of renal cell carcinoma, status post metastatic recurrence.  He has been treated multiple times with radiation.  He presented recently to his oncologist with increasing thigh pain.  Radiographs revealed concern for increasing size tumor distally.  He was seen and evaluated.  Once he was evaluated in the office, we made arrangements for Interventional Radiology attempted to be embolized as best as possible based on the historical concerns about the hypervascularity of these tumors.  Risks and benefits of the entire plan were reviewed at length.  The necessity of  procedure was reviewed. Consent was obtained for benefit of stabilization of the femur to allow for further treatment including radiation.  PROCEDURE IN DETAIL:  The patient was brought to the operative theater. Once adequate anesthesia, preoperative antibiotics, vancomycin and gentamicin administered due to penicillin allergy, he was positioned supine carefully.  His right lower extremity was prepped and draped in sterile fashion.  All the extremities were carefully padded in position. Fluoroscopy was used to orient itself for imaging.  Time-out was performed identifying the patient, planned procedure and extremity.  Incision was made proximal to the tip of the trochanter. The gluteal fascia was then incised.  A guidewire was initially inserted into the tip of the trochanter, then the proximal femur opened with a drill.  I then was able to pass a ball-tipped guidewire down to the distal tumor site, which did require me tapping to get through it to get to the distal femoral physeal plate.  I did confirm that the guidewire was at the fracture site in AP and lateral planes radiographically. Once this was done, I measured and selected 144-mm nail.  I then began reaming with 11-mm reamer, reamed up sequentially to a 12.5.  The right- sided Affixus 144-mm nail was then opened and inserted by hand to the fracture site and then using  the impaction plate impacted across the fracture site to the physis.  This was done due to the distal nature of the lesion.  At this point, through the jig, I placed the lag screw.  A guidewire was inserted.  Lag screw was then positioned.  I then locked this down as a fixed-angle device.  Once this was done, two screws were placed distally through a perfect circle technique.  Once this was done, final radiographs were obtained. The wounds were irrigated with normal saline solution.  The proximal wounds were closed in layers with #1 Vicryl on the iliotibial  band at the lag screw site and #1 Vicryl in the gluteal fascia proximally.  The remainder of the wounds were closed with 2-0 Vicryl.  Skin was closed, dressed sterilely.  He was then brought to the recovery room in stable condition tolerating the procedure well, findings were reviewed with family.  We did send remnants to Pathology for evaluation and confirmation of diagnosis.     Pietro Cassis Alvan Dame, M.D.     MDO/MEDQ  D:  02/16/2018  T:  02/16/2018  Job:  [id number]

## 2018-02-16 NOTE — Brief Op Note (Signed)
02/15/2018 - 02/16/2018  2:53 PM  PATIENT:  Keith [name]  52 y.o. male  PRE-OPERATIVE DIAGNOSIS:  impending right pathologic femur fracture  POST-OPERATIVE DIAGNOSIS:  impending right pathologic femur fracture  PROCEDURE:  Procedure(s): INTRAMEDULLARY (IM) NAIL RIGHT FEMORAL (Right)  SURGEON:  Surgeon(s) and Role:    Paralee Cancel, MD - Primary  PHYSICIAN ASSISTANT: Danae Orleans, PA-C  ANESTHESIA:   general  EBL:  500 cc   BLOOD ADMINISTERED:none  DRAINS: none   LOCAL MEDICATIONS USED:  NONE  SPECIMEN:  Source of Specimen:  right femoral reamings  DISPOSITION OF SPECIMEN:  PATHOLOGY  COUNTS:  YES  TOURNIQUET:  * No tourniquets in log *  DICTATION: .Other Dictation: Dictation Number [id number]  PLAN OF CARE: Admit to inpatient   PATIENT DISPOSITION:  PACU - hemodynamically stable.   Delay start of Pharmacological VTE agent (>24hrs) due to surgical blood loss or risk of bleeding: yes

## 2018-02-16 NOTE — Anesthesia Procedure Notes (Signed)
Procedure Name: Intubation Date/Time: 02/16/2018 2:57 PM Performed by: Sharlette Dense, CRNA Patient Re-evaluated:Patient Re-evaluated prior to induction Oxygen Delivery Method: Circle system utilized Preoxygenation: Pre-oxygenation with 100% oxygen Induction Type: IV induction Ventilation: Mask ventilation without difficulty and Oral airway inserted - appropriate to patient size Laryngoscope Size: Miller and 3 Grade View: Grade I Tube size: 8.0 mm Number of attempts: 1 Airway Equipment and Method: Stylet Placement Confirmation: positive ETCO2,  ETT inserted through vocal cords under direct vision and breath sounds checked- equal and bilateral Secured at: 22 cm Tube secured with: Tape Dental Injury: Teeth and Oropharynx as per pre-operative assessment

## 2018-02-16 NOTE — Anesthesia Preprocedure Evaluation (Signed)
Anesthesia Evaluation  Patient identified by MRN, date of birth, ID band Patient awake  General Assessment Comment:HISTORY OF PRESENT ILLNESS: Keith [name] is a 52 y.o. male seen at the request of Dr. [name]. In brief summary, he has stage IV renal cell carcinoma diagnosed in 2009 with disease to the bone as well as the lung.  He is status post laparoscopic radical nephrectomy with pathology revealing an 8.5 cm stage IIIB clear cell histologyin 07/2008.  He underwent thoracotomy for asynchronous metastatic lung lesions in October 2009 and status post stereotactic radiotherapy to 3 isolated metastatic pulmonary lesions in May 2011.  He was treated with Sutent 50 mg from November 2011 to April 2014 but this was discontinued due to disease progression.  He was started on Votrient 800 mg from April 2014 through July 2016 but this too was discontinued due to disease progression.  He completed palliative radiotherapy to the right sacral bone in April 2014 and then went on to have palliative radiotherapy to the left shoulder in April 2015.  He was treated with the Nivolumab from July 2016 through September 2016 but discontinued due to disease progression.  He developed painful bony metastasis in the left mid fibula and was treated with palliative radiotherapy in November 2016.  He started Carbometryx 60 mg daily in November 2016 and has continued this daily.    His most recent systemic imaging on January 20, 2018 demonstrated disease stability.  However, he is continued with progressive pain in his right distal femur, worse when he is reclining in a chair or trying to rest in bed at night but is able to weight-bear and ambulate without any major difficulties.  He does have increased swelling and warmth in his right knee intermittently, especially after he has been up on his feet for any period of time.  This resolves with elevation and rest.  He has not had any recent falls or  injuries and has continued to attend to his normal activities of daily living without any significant decline.  He has an OnQue pain pump with Morphine and has been using Dilaudid for breakthrough pain which does provide relief but is becoming less effective.       Reviewed: Allergy & Precautions, NPO status , Patient's Chart, lab work & pertinent test results  Airway Mallampati: II  TM Distance: >3 FB Neck ROM: Full    Dental no notable dental hx.    Pulmonary Current Smoker,    Pulmonary exam normal breath sounds clear to auscultation       Cardiovascular hypertension, Normal cardiovascular exam Rhythm:Regular Rate:Normal     Neuro/Psych negative neurological ROS  negative psych ROS   GI/Hepatic negative GI ROS, Neg liver ROS,   Endo/Other  diabetes  Renal/GU negative Renal ROS  negative genitourinary   Musculoskeletal negative musculoskeletal ROS (+)   Abdominal   Peds negative pediatric ROS (+)  Hematology negative hematology ROS (+)   Anesthesia Other Findings   Reproductive/Obstetrics negative OB ROS                             Anesthesia Physical Anesthesia Plan  ASA: IV  Anesthesia Plan: General   Post-op Pain Management:    Induction: Intravenous  PONV Risk Score and Plan: 2 and Ondansetron, Dexamethasone and Treatment may vary due to age or medical condition  Airway Management Planned: Oral ETT  Additional Equipment:   Intra-op Plan:   Post-operative Plan: Extubation in  OR  Informed Consent: I have reviewed the patients History and Physical, chart, labs and discussed the procedure including the risks, benefits and alternatives for the proposed anesthesia with the patient or authorized representative who has indicated his/her understanding and acceptance.   Dental advisory given  Plan Discussed with: CRNA and Surgeon  Anesthesia Plan Comments: (Ketamine IV intra-op, po tylenol pre-op )         Anesthesia Quick Evaluation

## 2018-02-17 ENCOUNTER — Encounter (HOSPITAL_COMMUNITY): Payer: Self-pay | Admitting: Orthopedic Surgery

## 2018-02-17 LAB — CBC
HCT: 36.9 % — ABNORMAL LOW (ref 39.0–52.0)
Hemoglobin: 11.7 g/dL — ABNORMAL LOW (ref 13.0–17.0)
MCH: 32.9 pg (ref 26.0–34.0)
MCHC: 31.7 g/dL (ref 30.0–36.0)
MCV: 103.7 fL — ABNORMAL HIGH (ref 78.0–100.0)
Platelets: 157 10*3/uL (ref 150–400)
RBC: 3.56 MIL/uL — ABNORMAL LOW (ref 4.22–5.81)
RDW: 15.6 % — ABNORMAL HIGH (ref 11.5–15.5)
WBC: 9.8 10*3/uL (ref 4.0–10.5)

## 2018-02-17 LAB — BASIC METABOLIC PANEL
Anion gap: 5 (ref 5–15)
BUN: 11 mg/dL (ref 6–20)
CO2: 30 mmol/L (ref 22–32)
Calcium: 8.2 mg/dL — ABNORMAL LOW (ref 8.9–10.3)
Chloride: 103 mmol/L (ref 101–111)
Creatinine, Ser: 0.94 mg/dL (ref 0.61–1.24)
GFR calc Af Amer: >60 mL/min (ref >60)
GFR calc non Af Amer: >60 mL/min (ref >60)
Glucose, Bld: 143 mg/dL — ABNORMAL HIGH (ref 65–99)
Potassium: 5.5 mmol/L — ABNORMAL HIGH (ref 3.5–5.1)
Sodium: 138 mmol/L (ref 135–145)

## 2018-02-17 MED ORDER — TIZANIDINE HCL 4 MG PO TABS: 4.0000 mg | ORAL_TABLET | Freq: Three times a day (TID) | ORAL | 0 refills | Status: DC | PRN

## 2018-02-17 MED ORDER — HYDROMORPHONE HCL 8 MG PO TABS: 8.0000 mg | ORAL_TABLET | ORAL | 0 refills | Status: DC | PRN

## 2018-02-17 MED ORDER — FERROUS SULFATE 325 (65 FE) MG PO TABS: 325.0000 mg | ORAL_TABLET | Freq: Three times a day (TID) | ORAL | 3 refills | Status: DC

## 2018-02-17 NOTE — Progress Notes (Signed)
     Subjective: 1 Day Post-Op Procedure(s) (LRB): INTRAMEDULLARY (IM) NAIL RIGHT FEMORAL (Right)   Seen by Dr. Alvan Dame. Patient reports pain as mild, pain controlled.  No events throughout the night. Procedure discussed along with expectations of recovery.  Going to work with PT to review precautions.  Read to be discharged home today.  Objective:   VITALS:   Vitals:   02/16/18 2055 02/17/18 0458  BP: (!) 145/92 138/81  Pulse: 77 (!) 55  Resp:    Temp: 98.2 F (36.8 C) 97.6 F (36.4 C)  SpO2: 98% 100%    Dorsiflexion/Plantar flexion intact Incision: dressing C/D/I No cellulitis present Compartment soft  LABS Recent Labs    02/15/18 1200 02/16/18 0607 02/17/18 0610  HGB 14.4 12.6* 11.7*  HCT 46.3 39.9 36.9*  WBC 7.2 5.7 9.8  PLT 180 148* 157    Recent Labs    02/15/18 1200 02/16/18 0607 02/17/18 0610  NA 138 142 138  K 4.2 4.4 5.5*  BUN 12 10 11   CREATININE 1.01 1.04 0.94  GLUCOSE 107* 109* 143*     Assessment/Plan: 1 Day Post-Op Procedure(s) (LRB): INTRAMEDULLARY (IM) NAIL RIGHT FEMORAL (Right) Advance diet Up with therapy D/C IV fluids Discharge home Follow up in 2 weeks at [facility]. Follow up with OLIN,Nahima Ales D in 2 weeks.  Contact information:  [facility] [address], [address]   PAC  02/17/2018, 7:54 AM

## 2018-02-17 NOTE — Progress Notes (Signed)
Patient has discharged to home on 02/17/18. Discharge instructions including medications and appointments was given to patient; no question at this time. Family member is taking patient to home by car.

## 2018-02-17 NOTE — Discharge Summary (Signed)
Physician Discharge Summary  Patient ID: Keith [name] MRN: [id number] DOB/AGE: 51/23/1967 51 y.o.  Admit date: 02/15/2018 Discharge date: 02/17/2018   Procedures:  Procedure(s) (LRB): INTRAMEDULLARY (IM) NAIL RIGHT FEMORAL (Right)  Attending Physician:  Dr. 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 Diabetes mellitus without complication ([place])    diet controlled only  . left renal ca d'd 2008  . 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 Metastasis to bone [facility]) 2014/2016    HPI:     Impending right femur fracture, metastatic lesion  HPI: Pt is a 52 y.o. male complaining of severe pain right hip and thigh(femur) pain for approximately 1 year. This pain varies in severity, but is constantly present.  He presented to the office for evaluation of a diagnosis of metastatic lesions to his proximal and distal right femur. He has a history of renal cell carcinoma initially diagnosed in 2009 as well as another carcinoma diagnosed around the same time. Recent workup had revealed lesions within the proximal femur and distal femur. He was asked to be evaluated due to increasing pain regarding his distal femur distal thigh region with the identified lesions and concern for impending pathologic fracture.  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 At this point the differential from an orthopedic perspective is not as relevant as trying to stabilize his femur to prevent fracture. Treatment for his bone lesions most likely will be palliative radiotherapy to these areas once we stabilize his bone. He has had that treatment and multiple areas otherwise.  Interventional radiology will see the patient prior to the IM nail to  embolize the lesion to try to prevent significant bleeding.  High chance of significant bleeding if metastatic lesion from renal origin and safest to embolize.  Patient and wife understand and wish to proceed witht he surgery to treat for iImpending right femur fracture.  Risks, benefits and expectations were discussed with the patient.  Risks including but not limited to the risk of anesthesia, blood clots, nerve damage, blood vessel damage, failure of the prosthesis, infection and up to and including death.  Patient understand the risks, benefits and expectations and wishes to proceed with surgery.  PCP: [name], MD   Discharged Condition: good  Hospital Course:  Patient was admitted to the hospital on 02/14/2018 and had the tumor in his right femur embolized.  He tolerated that procedure well and brought to the floor.  Patient underwent the above stated procedure on 02/15/2018. Patient tolerated the procedure well and brought to the recovery room in good condition and subsequently to the floor.  POD #1 BP: 138/81 ; Pulse: 55 ; Temp: 97.6 F (36.4 C)  Patient reports pain as mild, pain controlled.  No events throughout the night. Procedure discussed along with expectations of recovery.  Going to work with PT to review precautions.  Read to be discharged home today. Dorsiflexion/plantar flexion intact, incision: dressing C/D/I, no cellulitis present and compartment soft.   LABS  Basename    HGB     11.7  HCT     36.9    Discharge Exam: General appearance: alert, cooperative and no distress Extremities: Homans sign is negative, no sign of DVT, no edema, redness  or tenderness in the calves or thighs and no ulcers, gangrene or trophic changes  Disposition: Home with follow up in 2 weeks   Follow-up Information    Paralee Cancel, MD. Schedule an appointment as soon as possible for a visit in 2 week(s).   Specialty:  Orthopedic Surgery Contact information: [address] [place] [address] [phone]           Discharge Instructions    Call MD / Call 911   Complete by:  As directed    If you experience chest pain or shortness of breath, CALL 911 and be transported to the hospital emergency room.  If you develope a fever above 101 F, pus (white drainage) or increased drainage or redness at the wound, or calf pain, call your surgeon's office.   Constipation Prevention   Complete by:  As directed    Drink plenty of fluids.  Prune juice may be helpful.  You may use a stool softener, such as Colace (over the counter) 100 mg twice a day.  Use MiraLax (over the counter) for constipation as needed.   Diet - low sodium heart healthy   Complete by:  As directed    Discharge instructions   Complete by:  As directed    Maintain surgical dressing until follow up in the clinic. If the edges start to pull up, may reinforce with tape. If the dressing is no longer working, may remove and cover with gauze and tape, but must keep the area dry and clean.  Follow up in 2 weeks at [facility]. Call with any questions or concerns.   Partial weight bearing   Complete by:  As directed    % Body Weight:  50   Laterality:  right   Extremity:  Lower   TED hose   Complete by:  As directed    Use stockings (TED hose) for 2 weeks on both leg(s).  You may remove them at night for sleeping.      Allergies as of 02/17/2018      Reactions   Ceftriaxone Hives   Hydrocodone Swelling      Medication List    STOP taking these medications   ibuprofen 200 MG tablet Commonly known as:  ADVIL,MOTRIN     TAKE these medications   ALPRAZolam 1 MG tablet Commonly known as:  XANAX TAKE 1 TABLET THREE TIMES A DAY AS NEEDED FOR ANXIETY   cabozantinib S-Malate 60 MG Tabs Commonly known as:  CABOMETYX Take 1 tablet by mouth daily. With at least 8 ounces of water on an empty stomach. Do not eat for 2 hours before or 1 hour after. Swallow whole.   calcium  carbonate 500 MG chewable tablet Commonly known as:  TUMS - dosed in mg elemental calcium Chew 1 tablet by mouth as needed for indigestion or heartburn.   diphenoxylate-atropine 2.5-0.025 MG tablet Commonly known as:  LOMOTIL Take 1 tablet by mouth 4 (four) times daily as needed for diarrhea or loose stools.   DULoxetine 60 MG capsule Commonly known as:  CYMBALTA TAKE 1 CAPSULE (60 MG TOTAL) BY MOUTH 2 (TWO) TIMES DAILY.   ferrous sulfate 325 (65 FE) MG tablet Take 1 tablet (325 mg total) by mouth 3 (three) times daily after meals.   gabapentin 300 MG capsule Commonly known as:  NEURONTIN TAKE 1 CAPSULE (300 MG TOTAL) BY MOUTH 3 (THREE) TIMES DAILY.   HYDROmorphone 8 MG tablet Commonly known as:  DILAUDID  Take 1 tablet (8 mg total) by mouth every 4 (four) hours as needed for severe pain. What changed:  reasons to take this   rivaroxaban 20 MG Tabs tablet Commonly known as:  XARELTO Take 1 tablet (20 mg total) daily with supper by mouth.   tiZANidine 4 MG tablet Commonly known as:  ZANAFLEX Take 1 tablet (4 mg total) by mouth every 8 (eight) hours as needed for muscle spasms.            Discharge Care Instructions  (From admission, onward)        Start     Ordered   02/17/18 0000  Partial weight bearing    Question Answer Comment  % Body Weight 50   Laterality right   Extremity Lower      02/17/18 0804       Signed: [place] Pugh. Ily Denno   PA-C  02/17/2018, 4:53 PM

## 2018-02-18 LAB — TYPE AND SCREEN
ABO/RH(D): A NEG
Antibody Screen: NEGATIVE
Unit division: 0
Unit division: 0

## 2018-02-18 LAB — BPAM RBC
Blood Product Expiration Date: 201903052359
Blood Product Expiration Date: 201903052359
Unit Type and Rh: 600
Unit Type and Rh: 600

## 2018-02-22 ENCOUNTER — Telehealth: Payer: Self-pay

## 2018-02-22 ENCOUNTER — Other Ambulatory Visit: Payer: Medicare Other

## 2018-02-22 ENCOUNTER — Ambulatory Visit: Payer: Medicare Other | Admitting: Oncology

## 2018-02-22 NOTE — Telephone Encounter (Addendum)
Pt called this morning to notify of reason for appointment being missed today d/t diarrhea. Pt verbalized that he has been experiencing diarrhea since Friday (02/19/18) and has decreased energy today 5/10. MD asked if pt has been taking immodium and lomotil to help with his diarrhea symptoms. LVM on pt phone to ask if pt taking these medications and to return call to [phone] if he needs a refill on either medication. Pt additionally requested refill for xanax. In-basket sent to Dr. [name].

## 2018-02-23 ENCOUNTER — Other Ambulatory Visit: Payer: Self-pay

## 2018-02-23 ENCOUNTER — Telehealth: Payer: Self-pay

## 2018-02-23 DIAGNOSIS — F419 Anxiety disorder, unspecified: Secondary | ICD-10-CM

## 2018-02-23 DIAGNOSIS — C649 Malignant neoplasm of unspecified kidney, except renal pelvis: Secondary | ICD-10-CM

## 2018-02-23 DIAGNOSIS — K611 Rectal abscess: Secondary | ICD-10-CM

## 2018-02-23 DIAGNOSIS — C7951 Secondary malignant neoplasm of bone: Secondary | ICD-10-CM

## 2018-02-23 DIAGNOSIS — K8689 Other specified diseases of pancreas: Secondary | ICD-10-CM

## 2018-02-23 MED ORDER — ALPRAZOLAM 1 MG PO TABS: ORAL_TABLET | ORAL | 0 refills | Status: DC

## 2018-02-23 NOTE — Telephone Encounter (Signed)
Called requesting refill on Xanax.

## 2018-03-01 ENCOUNTER — Inpatient Hospital Stay (HOSPITAL_BASED_OUTPATIENT_CLINIC_OR_DEPARTMENT_OTHER): Payer: Medicare Other | Admitting: Oncology

## 2018-03-01 ENCOUNTER — Inpatient Hospital Stay: Payer: Medicare Other | Attending: Oncology

## 2018-03-01 VITALS — BP 136/95 | HR 84 | Resp 18 | Ht 75.0 in | Wt 181.2 lb

## 2018-03-01 DIAGNOSIS — C7801 Secondary malignant neoplasm of right lung: Secondary | ICD-10-CM | POA: Diagnosis not present

## 2018-03-01 DIAGNOSIS — Z7901 Long term (current) use of anticoagulants: Secondary | ICD-10-CM | POA: Diagnosis not present

## 2018-03-01 DIAGNOSIS — C649 Malignant neoplasm of unspecified kidney, except renal pelvis: Secondary | ICD-10-CM | POA: Diagnosis not present

## 2018-03-01 DIAGNOSIS — C7951 Secondary malignant neoplasm of bone: Secondary | ICD-10-CM

## 2018-03-01 DIAGNOSIS — I2699 Other pulmonary embolism without acute cor pulmonale: Secondary | ICD-10-CM | POA: Insufficient documentation

## 2018-03-01 LAB — CMP (CANCER CENTER ONLY)
ALT: 32 U/L (ref 0–55)
AST: 26 U/L (ref 5–34)
Albumin: 3 g/dL — ABNORMAL LOW (ref 3.5–5.0)
Alkaline Phosphatase: 115 U/L (ref 40–150)
Anion gap: 8 (ref 3–11)
BUN: 14 mg/dL (ref 7–26)
CO2: 27 mmol/L (ref 22–29)
Calcium: 9.3 mg/dL (ref 8.4–10.4)
Chloride: 104 mmol/L (ref 98–109)
Creatinine: 0.92 mg/dL (ref 0.70–1.30)
GFR, Est AFR Am: >60 mL/min (ref >60)
GFR, Estimated: >60 mL/min (ref >60)
Glucose, Bld: 115 mg/dL (ref 70–140)
Potassium: 4.5 mmol/L (ref 3.5–5.1)
Sodium: 139 mmol/L (ref 136–145)
Total Bilirubin: 0.3 mg/dL (ref 0.2–1.2)
Total Protein: 6.9 g/dL (ref 6.4–8.3)

## 2018-03-01 LAB — CBC WITH DIFFERENTIAL (CANCER CENTER ONLY)
Basophils Absolute: 0 10*3/uL (ref 0.0–0.1)
Basophils Relative: 0 %
Eosinophils Absolute: 0.2 10*3/uL (ref 0.0–0.5)
Eosinophils Relative: 2 %
HCT: 36.6 % — ABNORMAL LOW (ref 38.4–49.9)
Hemoglobin: 11.4 g/dL — ABNORMAL LOW (ref 13.0–17.1)
Lymphocytes Relative: 21 %
Lymphs Abs: 1.9 10*3/uL (ref 0.9–3.3)
MCH: 32 pg (ref 27.2–33.4)
MCHC: 31.1 g/dL — ABNORMAL LOW (ref 32.0–36.0)
MCV: 102.8 fL — ABNORMAL HIGH (ref 79.3–98.0)
Monocytes Absolute: 0.4 10*3/uL (ref 0.1–0.9)
Monocytes Relative: 5 %
Neutro Abs: 6.4 10*3/uL (ref 1.5–6.5)
Neutrophils Relative %: 72 %
Platelet Count: 197 10*3/uL (ref 140–400)
RBC: 3.56 MIL/uL — ABNORMAL LOW (ref 4.20–5.82)
RDW: 16.3 % — ABNORMAL HIGH (ref 11.0–14.6)
WBC Count: 9 10*3/uL (ref 4.0–10.3)

## 2018-03-01 NOTE — Progress Notes (Signed)
Hematology and Oncology Follow Up Visit  Keith [name] [id number] [birth year] 52 y.o. 03/01/2018 11:09 AM  [name], MD  [name], M.D.  [name], MD  [name], MD    Principle Diagnosis: 52 year old man with advanced renal cell carcinoma with disease to the bone and lung. He was initially diagnosed in 2009.    Prior Therapy: 1. Status post laparoscopic radical nephrectomy.  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  2. Patient status post thoracotomy for a synchronous metastatic lung lesions done October 2009.   3. Patient is status post stereotactic radiotherapy to pulmonary nodules in May of 2010. 4. He is S/P Sutent 50 mg 4 weeks on 2 weeks off from 10/2010 to 03/2013. He progressed at that time.  5. He is S/P radiation to the right sacral bone between 4/22 to 4/30.  6. He is S/P XRT to the left shoulder 03/20/14 to 03/31/14. 7. Votrient 800 mg by mouth daily from 03/2013 through 06/22/2015. Discontinued secondary to disease progression. 8. Nivolumab 3 mg/kg given every 2 weeks started on 06/29/2015. He is status post 4 cycles completed 08/10/2015. He developed disease progression in September 2016.  9. Status post radiation therapy to the left mid fibula completed on 11/14/2015. He received a grade 1 fraction.    Current therapy:  Cabometyx 60 mg daily started in November 2016.   Interim History:  Keith [name] is here for a follow-up.  Since the last visit, he was found to have metastatic disease to the right femur with impending pathological fracture. Based on these findings, he underwent a intramedullary nailing of his right femur to prevent pathological fractures and was completed on 02/16/2018. This was performed by Dr. Ihor Gully and the procedure was tolerated reasonably well. He is recovered without any complications at this time. He continues to have issues with pain and does use Dilaudid as needed. He still able to ambulate and bear  weight at this time.  He feels reasonably well otherwise without any constitutional symptoms. He denied any worsening diarrhea or progressive fatigue. He denied any loss of consciousness or alteration of mental status.   He did not report any headaches blurred vision or double vision. Does not report any seizure activity or alteration of mental status.  He does not report any chest pain, palpitation, orthopnea or leg edema.  He does not report any cough, wheezing or hemoptysis.  Does not report any abdominal pain or hematochezia. Does not report any hematuria but does report occasional hesitancy and nocturia. He does not report any rashes or lesions or petechiae. He does not report any lymphadenopathy.  He does not report any anxiety or depression.  His mood remained stable.  He does not report any other bone pain or pathological fractures.  He does not report any heat or cold intolerance.  Remainder of his review of system is negative.   Medications: I have reviewed the patient's current medications.  Current Outpatient Medications  Medication Sig Dispense Refill  . ALPRAZolam (XANAX) 1 MG tablet TAKE 1 TABLET THREE TIMES A DAY AS NEEDED FOR ANXIETY 90 tablet 0  . cabozantinib S-Malate (CABOMETYX) 60 MG TABS Take 1 tablet by mouth daily. With at least 8 ounces of water on an empty stomach. Do not eat for 2 hours before or 1 hour after. Swallow whole. 30 tablet 0  . calcium carbonate (TUMS - DOSED IN MG ELEMENTAL CALCIUM) 500 MG chewable tablet Chew 1 tablet by mouth  as needed for indigestion or heartburn.    . diphenoxylate-atropine (LOMOTIL) 2.5-0.025 MG tablet Take 1 tablet by mouth 4 (four) times daily as needed for diarrhea or loose stools. 60 tablet 0  . DULoxetine (CYMBALTA) 60 MG capsule TAKE 1 CAPSULE (60 MG TOTAL) BY MOUTH 2 (TWO) TIMES DAILY. 180 capsule 0  . ferrous sulfate 325 (65 FE) MG tablet Take 1 tablet (325 mg total) by mouth 3 (three) times daily after meals.  3  . gabapentin  (NEURONTIN) 300 MG capsule TAKE 1 CAPSULE (300 MG TOTAL) BY MOUTH 3 (THREE) TIMES DAILY. 270 capsule 0  . HYDROmorphone (DILAUDID) 8 MG tablet Take 1 tablet (8 mg total) by mouth every 4 (four) hours as needed for severe pain. 30 tablet 0  . rivaroxaban (XARELTO) 20 MG TABS tablet Take 1 tablet (20 mg total) daily with supper by mouth. 90 tablet 6  . tiZANidine (ZANAFLEX) 4 MG tablet Take 1 tablet (4 mg total) by mouth every 8 (eight) hours as needed for muscle spasms. 30 tablet 0   No current facility-administered medications for this visit.      Allergies:  Allergies  Allergen Reactions  . Ceftriaxone Hives  . Hydrocodone Swelling    Past Medical History, Surgical history, Social history, and Family History were reviewed and updated.   Physical Exam: Blood pressure (!) 136/95, pulse 84, resp. rate 18, height 6\' 3"  (1.905 m), weight 181 lb 3.2 oz (82.2 kg), SpO2 100 %. ECOG: 1 General appearance: Chronically ill-appearing gentleman appeared without distress today. Head: Without any abnormalities. Oral mucosa: No thrush or ulcers. Eyes: Pupils are equal and round and reactive to light. Lymph nodes: Cervical, supraclavicular, and axillary nodes normal. Heart:regular rate and rhythm, S1 and S2 without murmurs. Lung: Clear without any rhonchi or wheezes or dullness to percussion. Abdomen: Soft, nontender without any rebound or guarding. Skin: No petechiae or ecchymosis. Musculoskeletal: No joint deformity or effusion noted. Incision sites appear well healed. Neuro: No focal deficits noted.   Lab Results: Lab Results  Component Value Date   WBC 9.0 03/01/2018   HGB 11.7 (L) 02/17/2018   HCT 36.6 (L) 03/01/2018   MCV 102.8 (H) 03/01/2018   PLT 197 03/01/2018    Impression and Plan:  52 year old man with  1. Advanced renal cell carcinoma diagnosed in 2009. He has documented disease to the bone and the lung.  He is currently receiving Cabometyx 60 mg as tolerated it well.  Staging workup did not show any evidence of progression of disease with his visceral metastasis. Despite his worsening bony disease, we have discussed continuing this treatment versus different salvage therapy. Given his excellent response to this therapy for an extended period of time, we have elected to continue with this medication. There is very little treatment options beyond this current treatment and we will defer changing this treatment unless there is clear cut visceral progression. 2. Right femur metastasis: He is status post fixation with with orthopedic surgery in February 2019. He has evaluation with Dr. Tammi Klippel the near future for adjuvant radiation therapy. This will help his pain long-term 3. Hypertension: No issues related to his blood pressure at this time. We will continue to monitor. 4. Pulmonary embolism: On Xarelto for long-term treatment. 5. Prognosis: He has an incurable malignancy that has been palliated the last 10 years. Risks and benefits of continuing aggressive treatment was discussed he is agreeable to continue. 6. Diarrhea: Controlled at this time. 7. Anxiety: Mood is appropriate and anxiety is  under control. 8. Follow up: in 4 weeks.   25 minutes was spent with the patient face-to-face today.  More than 50% of time was dedicated to patient counseling, education and coordination of his care.   Zola Button, MD 03/01/18

## 2018-03-01 NOTE — Progress Notes (Signed)
  Radiation Oncology         [phone] ________________________________  Name: Keith [name] MRN: [id number]  Date: 03/03/2018  DOB: 02/25/1966  SIMULATION AND TREATMENT PLANNING NOTE    ICD-10-CM   1. Bone metastasis (HCC) C79.51     DIAGNOSIS:  52 yo man with painful 3.8 cm right femur metastasis from renal cell cancer - Stage IV  NARRATIVE:  The patient was brought to the [place].  Identity was confirmed.  All relevant records and images related to the planned course of therapy were reviewed.  The patient freely provided informed written consent to proceed with treatment after reviewing the details related to the planned course of therapy. The consent form was witnessed and verified by the simulation staff.  Then, the patient was set-up in a stable reproducible  supine position for radiation therapy.  CT images were obtained.  Surface markings were placed.  The CT images were loaded into the planning software.  Then the target and avoidance structures were contoured.  Treatment planning then occurred.  The radiation prescription was entered and confirmed.  Then, I designed and supervised the construction of a total of 3 medically necessary complex treatment devices.  I have requested : Isodose Plan.    PLAN:  The patient will receive 30 Gy in 10 fractions.  ________________________________  Sheral Apley Tammi Klippel, M.D.

## 2018-03-02 ENCOUNTER — Telehealth: Payer: Self-pay

## 2018-03-02 NOTE — Telephone Encounter (Signed)
Unable to leave a voice message concerning upcoming appointment information. Mailing a calender of these schedule appointment. Per 3/11 los

## 2018-03-03 ENCOUNTER — Encounter: Payer: Self-pay | Admitting: Urology

## 2018-03-03 ENCOUNTER — Ambulatory Visit
Admission: RE | Admit: 2018-03-03 | Discharge: 2018-03-03 | Disposition: A | Discharge status: Home / Self Care | Payer: Medicare Other | Source: Ambulatory Visit | Attending: Radiation Oncology | Admitting: Radiation Oncology

## 2018-03-03 ENCOUNTER — Ambulatory Visit: Payer: Medicare Other | Admitting: Urology

## 2018-03-03 ENCOUNTER — Ambulatory Visit
Admission: RE | Admit: 2018-03-03 | Discharge: 2018-03-03 | Disposition: A | Discharge status: Home / Self Care | Payer: Medicare Other | Source: Ambulatory Visit | Attending: Urology | Admitting: Urology

## 2018-03-03 ENCOUNTER — Other Ambulatory Visit: Payer: Self-pay

## 2018-03-03 VITALS — BP 128/90 | HR 111 | Temp 97.6°F | Resp 20 | Ht 75.0 in | Wt 182.6 lb

## 2018-03-03 DIAGNOSIS — Z7901 Long term (current) use of anticoagulants: Secondary | ICD-10-CM | POA: Insufficient documentation

## 2018-03-03 DIAGNOSIS — G893 Neoplasm related pain (acute) (chronic): Secondary | ICD-10-CM | POA: Diagnosis not present

## 2018-03-03 DIAGNOSIS — Z885 Allergy status to narcotic agent status: Secondary | ICD-10-CM | POA: Diagnosis not present

## 2018-03-03 DIAGNOSIS — E119 Type 2 diabetes mellitus without complications: Secondary | ICD-10-CM | POA: Diagnosis not present

## 2018-03-03 DIAGNOSIS — Z9889 Other specified postprocedural states: Secondary | ICD-10-CM | POA: Insufficient documentation

## 2018-03-03 DIAGNOSIS — C7800 Secondary malignant neoplasm of unspecified lung: Secondary | ICD-10-CM | POA: Diagnosis not present

## 2018-03-03 DIAGNOSIS — Z905 Acquired absence of kidney: Secondary | ICD-10-CM | POA: Insufficient documentation

## 2018-03-03 DIAGNOSIS — F17210 Nicotine dependence, cigarettes, uncomplicated: Secondary | ICD-10-CM | POA: Insufficient documentation

## 2018-03-03 DIAGNOSIS — C7951 Secondary malignant neoplasm of bone: Secondary | ICD-10-CM | POA: Insufficient documentation

## 2018-03-03 DIAGNOSIS — C7889 Secondary malignant neoplasm of other digestive organs: Secondary | ICD-10-CM | POA: Insufficient documentation

## 2018-03-03 DIAGNOSIS — Z881 Allergy status to other antibiotic agents status: Secondary | ICD-10-CM | POA: Insufficient documentation

## 2018-03-03 DIAGNOSIS — Z85830 Personal history of malignant neoplasm of bone: Secondary | ICD-10-CM | POA: Diagnosis not present

## 2018-03-03 DIAGNOSIS — Z79899 Other long term (current) drug therapy: Secondary | ICD-10-CM | POA: Insufficient documentation

## 2018-03-03 DIAGNOSIS — C649 Malignant neoplasm of unspecified kidney, except renal pelvis: Secondary | ICD-10-CM | POA: Diagnosis not present

## 2018-03-03 DIAGNOSIS — Z923 Personal history of irradiation: Secondary | ICD-10-CM | POA: Diagnosis not present

## 2018-03-03 DIAGNOSIS — S728X1D Other fracture of right femur, subsequent encounter for closed fracture with routine healing: Secondary | ICD-10-CM | POA: Diagnosis not present

## 2018-03-03 DIAGNOSIS — M79651 Pain in right thigh: Secondary | ICD-10-CM | POA: Insufficient documentation

## 2018-03-03 DIAGNOSIS — Z85528 Personal history of other malignant neoplasm of kidney: Secondary | ICD-10-CM | POA: Diagnosis not present

## 2018-03-03 NOTE — Progress Notes (Signed)
Radiation Oncology         [phone] ________________________________  Outpatient Follow up visit  Name: Keith [name] MRN: [id number]  Date of Service: 03/03/2018 DOB: December 31, 1965  TK:WIOXB, [name], MD  [name], MD   REFERRING PHYSICIAN: [name], MD  DIAGNOSIS:  52 yo man with painful proximal and distal right femur metastasis from renal cell cancer - Stage IV    ICD-10-CM   1. Bone metastasis ([place]) C79.51    HISTORY OF PRESENT ILLNESS: Keith [name] is a 52 y.o. male with a painful right femur metastasis from renal cell cancer - Stage IV. In brief summary, he has stage IV renal cell carcinoma diagnosed in 2009 with disease to the bone as well as the lung.  He is status post laparoscopic radical nephrectomy with pathology revealing an 8.5 cm stage IIIB clear cell histology in 07/2008.  He underwent thoracotomy for asynchronous metastatic lung lesions in October 2009 and status post stereotactic radiotherapy to 3 isolated metastatic pulmonary lesions in May 2011.  He was treated with Sutent 50 mg from November 2011 to April 2014 but this was discontinued due to disease progression.  He was started on Votrient 800 mg from April 2014 through July 2016 but this too was discontinued due to disease progression.  He completed palliative radiotherapy to the right sacral bone in April 2014 and then went on to have palliative radiotherapy to the left shoulder in April 2015.  He was treated with the Nivolumab from July 2016 through September 2016 but discontinued due to disease progression.  He developed painful bony metastasis in the left mid fibula and was treated with palliative radiotherapy in November 2016.  He started Carbometryx 60 mg daily in November 2016 and has continued this daily.    His most recent systemic imaging on January 20, 2018 demonstrated disease stability.  However, when we saw him for re-consult on 01/26/18  he continued with progressive pain in his right distal  femur, worse when reclining in a chair or trying to rest in bed at night but able to weight-bear and ambulate without any major difficulties.  He was having increased swelling and warmth in his right knee intermittently, especially after he had been up on his feet for any period of time.  This resolved with elevation and rest.  He has not had any recent falls or injuries and has continued to attend to his normal activities of daily living without any significant decline.  He has an OnQue pain pump with Morphine and has been using Dilaudid for breakthrough pain which does provide relief but was becoming less effective.    An MRI of the right femur was obtained on January 20, 2018 which showed an irregular 3.8 x 3.1 x 3.1 cm destructive mass lesion in the anterior aspect of the intertrochanteric region of the proximal right femur with focal anterior cortical destruction as well as a 6.8 x 3.5 x 3.1 cm destructive lesion in the distal right femoral shaft just above the femoral condyles with disruption of the posterior cortex and extension of the tumor into the adjacent anterior lateral soft tissues.    He presents today in follow up, now approximately 2 weeks s/p prophylactic IM nailing with Dr. Paralee Cancel on 02/16/18, for consideration of adjuvant post-op radiotherapy.   PREVIOUS RADIATION THERAPY: Yes  -11/14/2015:  The 6 cm destructive left mid fibula metastasis was treated to 8 Gy in one fraction -03/20/2014-03/31/2014 The patient's left shoulder was treated to  30 gray in 10 fractions  -04/12/2013-04/20/2013 isolated right sacral oligometastatic deposit to 50 gray in 5 fractions -May 2011 - stereotactic body radiotherapy to three isolated pulmonary metastases (1.2 cm in the right upper lobe, 5 mm in the right upper lobe and 1 cm in the right lung base) to 50 Gy in five fractions    PAST MEDICAL HISTORY:  Past Medical History:  Diagnosis Date  . Diabetes mellitus without complication ([place])    diet  controlled only  . left renal ca d'd 2008  . Malignant neoplasm metastatic to pancreas ([place]) 2012  . 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 Metastasis to bone [facility]) 2014/2016      PAST SURGICAL HISTORY: Past Surgical History:  Procedure Laterality Date  . COLONOSCOPY W/ POLYPECTOMY    . FEMUR IM NAIL Right 02/16/2018   Procedure: INTRAMEDULLARY (IM) NAIL RIGHT FEMORAL;  Surgeon: Paralee Cancel, MD;  Location: [facility];  Service: Orthopedics;  Laterality: Right;  . HAND SURGERY Right   . IR ANGIOGRAM EXTREMITY RIGHT  02/15/2018  . IR ANGIOGRAM SELECTIVE EACH ADDITIONAL VESSEL  02/15/2018  . IR ANGIOGRAM SELECTIVE EACH ADDITIONAL VESSEL  02/15/2018  . IR ANGIOGRAM SELECTIVE EACH ADDITIONAL VESSEL  02/15/2018  . IR ANGIOGRAM SELECTIVE EACH ADDITIONAL VESSEL  02/15/2018  . IR EMBO TUMOR ORGAN ISCHEMIA INFARCT INC GUIDE ROADMAPPING  02/15/2018  . IR EMBO TUMOR ORGAN ISCHEMIA INFARCT INC GUIDE ROADMAPPING  02/15/2018  . IR US GUIDE VASC ACCESS LEFT  02/15/2018  . LUNG REMOVAL, PARTIAL Right 09/2008  . NEPHRECTOMY RADICAL     Left   . PAIN PUMP IMPLANTATION N/A 05/16/2015   Procedure: Intrathecal pain pump placement;  Surgeon: Clydell Hakim, MD;  Location: [facility];  Service: Neurosurgery;  Laterality: N/A;  Intrathecal pain pump placement    FAMILY HISTORY:  Family History  Problem Relation Age of Onset  . Hypertension Mother   . Diabetes Brother   . Irritable bowel syndrome Sister   . Cancer Maternal Aunt        ovarian  . Colon cancer Neg Hx     SOCIAL HISTORY:  Social History   Socioeconomic History  . Marital status: Married    Spouse name: Not on file  . Number of children: 2  . Years of education: Not on file  . Highest education level: Not on file  Social Needs  . Financial resource strain: Not on file  . Food insecurity - worry: Not on file  . Food insecurity - inability: Not on file  . Transportation needs - medical: Not on file  . Transportation needs - non-medical: Not on file    Occupational History  . Occupation: Clinical biochemist   Tobacco Use  . Smoking status: Current Every Day Smoker    Packs/day: 1.00    Years: 30.00    Pack years: 30.00    Types: Cigarettes  . Smokeless tobacco: Never Used  Substance and Sexual Activity  . Alcohol use: No  . Drug use: Yes    Types: Marijuana    Comment: Daily. Last used: last night.   . Sexual activity: Not Currently  Other Topics Concern  . Not on file  Social History Narrative   6 caffeine drinks daily     ALLERGIES: Ceftriaxone and Hydrocodone  MEDICATIONS:  Current Outpatient Medications  Medication Sig Dispense Refill  . ALPRAZolam (XANAX) 1 MG tablet TAKE 1 TABLET THREE TIMES A DAY AS NEEDED FOR ANXIETY 90 tablet 0  . cabozantinib S-Malate (CABOMETYX)  60 MG TABS Take 1 tablet by mouth daily. With at least 8 ounces of water on an empty stomach. Do not eat for 2 hours before or 1 hour after. Swallow whole. 30 tablet 0  . calcium carbonate (TUMS - DOSED IN MG ELEMENTAL CALCIUM) 500 MG chewable tablet Chew 1 tablet by mouth as needed for indigestion or heartburn.    . diphenoxylate-atropine (LOMOTIL) 2.5-0.025 MG tablet Take 1 tablet by mouth 4 (four) times daily as needed for diarrhea or loose stools. 60 tablet 0  . DULoxetine (CYMBALTA) 60 MG capsule TAKE 1 CAPSULE (60 MG TOTAL) BY MOUTH 2 (TWO) TIMES DAILY. 180 capsule 0  . ferrous sulfate 325 (65 FE) MG tablet Take 1 tablet (325 mg total) by mouth 3 (three) times daily after meals.  3  . gabapentin (NEURONTIN) 300 MG capsule TAKE 1 CAPSULE (300 MG TOTAL) BY MOUTH 3 (THREE) TIMES DAILY. 270 capsule 0  . rivaroxaban (XARELTO) 20 MG TABS tablet Take 1 tablet (20 mg total) daily with supper by mouth. 90 tablet 6  . tiZANidine (ZANAFLEX) 4 MG tablet Take 1 tablet (4 mg total) by mouth every 8 (eight) hours as needed for muscle spasms. 30 tablet 0  . HYDROmorphone (DILAUDID) 8 MG tablet Take 1 tablet (8 mg total) by mouth every 4 (four) hours as needed for severe pain.  (Patient not taking: Reported on 03/03/2018) 30 tablet 0   No current facility-administered medications for this encounter.     REVIEW OF SYSTEMS:  On review of systems, the patient reports that he is doing well overall. He denies any chest pain, shortness of breath, cough, fevers, chills, night sweats, unintended weight changes. He denies any bowel or bladder disturbances, and denies abdominal pain, nausea or vomiting. He denies any new musculoskeletal or joint aches or pains but continues with severe right femur pain postoperatively.  The pain is improved with use of his pain medications but unfortunately, he reports having run out of pain medications over the weekend so he has been without pain medications for several days now.  He denies significant swelling, numbness or tingling in the right leg and has not had drainage or bleeding from the incision sites. A complete review of systems is obtained and is otherwise negative.    PHYSICAL EXAM:  Wt Readings from Last 3 Encounters:  No data found for Wt   Temp Readings from Last 3 Encounters:  No data found for Temp   BP Readings from Last 3 Encounters:  No data found for BP   Pulse Readings from Last 3 Encounters:  No data found for Pulse   Pain Assessment Pain Score: 9  Pain Loc: Leg(Right leg)/10  In general this is a well appearing Caucasian male in no acute distress.  He is alert and oriented x4 and appropriate throughout the examination. HEENT reveals that the patient is normocephalic, atraumatic. EOMs are intact. PERRLA. Skin is intact without any evidence of gross lesions. Cardiovascular exam reveals a regular rate and rhythm, no clicks rubs or murmurs are auscultated. Chest is clear to auscultation bilaterally. Lymphatic assessment is performed and does not reveal any adenopathy in the cervical, supraclavicular, axillary, or inguinal chains. Abdomen has active bowel sounds in all quadrants and is intact. The abdomen is soft, non  tender, non distended. Lower extremities are negative for pretibial pitting edema, deep calf tenderness, cyanosis or clubbing.  The incision dressings remain C/D/I without surrounding erythema or evidence of infection. There is currently no visible edema  or erythema in the right knee.  He is tender to palpation over the right distal femur but has full ROM with flexion and extension of the right knee.  Sensation is intact to light touch in the right and left LEs.   KPS = 90  100 - Normal; no complaints; no evidence of disease. 90   - Able to carry on normal activity; minor signs or symptoms of disease. 80   - Normal activity with effort; some signs or symptoms of disease. 50   - Cares for self; unable to carry on normal activity or to do active work. 60   - Requires occasional assistance, but is able to care for most of his personal needs. 50   - Requires considerable assistance and frequent medical care. 31   - Disabled; requires special care and assistance. 58   - Severely disabled; hospital admission is indicated although death not imminent. 41   - Very sick; hospital admission necessary; active supportive treatment necessary. 10   - Moribund; fatal processes progressing rapidly. 0     - Dead  Karnofsky DA, Abelmann [place], Craver LS and Burchenal JH [phone]) The use of the nitrogen mustards in the palliative treatment of carcinoma: with particular reference to bronchogenic carcinoma Cancer 1 [id number]  LABORATORY DATA:  Lab Results  Component Value Date   WBC 9.0 03/01/2018   HGB 11.7 (L) 02/17/2018   HCT 36.6 (L) 03/01/2018   MCV 102.8 (H) 03/01/2018   PLT 197 03/01/2018   Lab Results  Component Value Date   NA 139 03/01/2018   K 4.5 03/01/2018   CL 104 03/01/2018   CO2 27 03/01/2018   Lab Results  Component Value Date   ALT 32 03/01/2018   AST 26 03/01/2018   ALKPHOS 115 03/01/2018   BILITOT 0.3 03/01/2018     RADIOGRAPHY: Ir Angiogram Extremity Right  Result Date:  02/15/2018 INDICATION: History of metastatic renal cell carcinoma, now with lytic expansile lesions within proximal and distal aspects of the right femur. Please perform percutaneous embolization prior to intramedullary rod fixation. EXAM: 1. ULTRASOUND GUIDANCE FOR ARTERIAL ACCESS 2. PELVIC ARTERIOGRAM 3. RIGHT LOWER EXTREMITY ARTERIOGRAM 4. SELECTIVE RIGHT DEEP FEMORAL ARTERIOGRAM 5. SELECTIVE RIGHT DEEP FEMORAL ARTERIOGRAM AND PERCUTANEOUS EMBOLIZATION. 6. SELECTIVE RIGHT SUPRA GENICULATE ARTERIOGRAM AND PERCUTANEOUS PARTICLE EMBOLIZATION. COMPARISON:  CTA run off - 02/04/2017 MEDICATIONS: Vancomycin 1 gm IV. The antibiotic was administered within 1 hour of the procedure ANESTHESIA/SEDATION: Moderate (conscious) sedation was employed during this procedure. A total of Versed 8 mg and Fentanyl 400 mcg was administered intravenously. Moderate Sedation Time: 92 minutes. The patient's level of consciousness and vital signs were monitored continuously by radiology nursing throughout the procedure under my direct supervision. CONTRAST:  115 cc Omnipaque 300 FLUOROSCOPY TIME:  19 minutes 36 seconds (967 mGy). COMPLICATIONS: None immediate. PROCEDURE: Informed consent was obtained from the patient following explanation of the procedure, risks, benefits and alternatives. The patient understands, agrees and consents for the procedure. All questions were addressed. A time out was performed prior to the initiation of the procedure. Maximal barrier sterile technique utilized including caps, mask, sterile gowns, sterile gloves, large sterile drape, hand hygiene, and Betadine prep. The left femoral head was marked fluoroscopically. Under ultrasound guidance, the left common femoral artery was accessed with a micropuncture kit after the overlying soft tissues were anesthetized with 1% lidocaine. An ultrasound image was saved for documentation purposes. The micropuncture sheath was exchanged for a 5 [place] vascular sheath over a  Bentson wire. A closure arteriogram was performed through the side of the sheath confirming access within the right common femoral artery. Over a Bentson wire, a Omni Flush catheter was advanced to the level of the inferior aspect of the abdominal aorta, back bled and flushed. Pelvic arteriogram was performed Next, with the use of a stiff Glidewire, the Omni Flush catheter was advanced into the contralateral right common femoral artery and a diagnostic right lower extremity arteriogram was performed at multiple stations to the level of the right foot. Over a stiff Glidewire, both the Omni Flush catheter and the 5 [place] vascular sheath were exchanged for a 55 cm radiopaque tip 5 [place] vascular sheath which was advanced from the level of the left common femoral artery access site to the contralateral right common femoral artery. With the use of a regular glidewire, a 4 French angled glide catheter was utilized to select several branches of the right deep femoral artery, ultimately, the ascending branch of the lateral femoral circumflex artery was selected with contrast injection demonstrating supply to the ill-defined hypervascular lesion within the proximal diaphysis of the femur. With the use of a fathom 14 microwire, a high-flow Renegade microcatheter was advanced into the proximal aspect of the ascending branch of the lateral femoral circumflex artery with contrast injection confirming appropriate positioning. Next, the vessel was embolized to near stasis with approximately 1/10 of a vial of 300-500 micron Embospheres. The microcatheter was advanced centrally and post embolization arteriogram was performed. Microcatheter was removed and the 4 French angled glide catheter was utilized to select the descending genicular artery. Contrast injection demonstrated apparent solitary supply to the known lytic expansile lesion involving the distal diaphysis of the femur. Again, a fathom 14 microwire was utilized to  advance a high-flow Renegade microcatheter into a tiny branch vessel supplying the hypervascular lesion. Contrast injection confirmed appropriate positioning and hypervascular lesion was embolized with approximately 2/10 of a vial of 300-500 micron Embospheres. The microcatheter was retracted into the origin of the vessel and post embolization arteriogram was performed. At this point, all wires, catheters and sheaths were removed from the patient. Superficial hemostasis was achieved at the left groin access site with manual compression. A dressing was placed. The patient tolerated the procedure well without immediate postprocedural complication. FINDINGS: Pelvic arteriogram demonstrates wide patency of the bilateral common, external and internal iliac arteries. Redemonstrated focal ectasia of the bilateral common iliac arteries. Right lower extremity arteriogram demonstrates wide patency of the right common and superficial femoral arteries as well wide patency of the right above and below-knee popliteal arteries. Ill-defined hypervascular lesions are seen within the proximal and distal aspects of the right femur as well as the proximal tibia as demonstrated on preceding CTA run-off. There is a three-vessel runoff to the right lower leg and foot common however note is made of sluggish flow to the right foot favored to be secondary to steal phenomenon due to the hypervascular lesions within the femur and proximal tibia. Sub selective arteriogram of the ascending branch of lateral femoral circumflex artery demonstrated apparent solitary supply to the ill-defined hypervascular lesion involving the proximal femoral diaphysis. This vessel was catheterized and successfully particle embolized. Completion arteriogram demonstrates no persistent enhancement of the ill-defined proximal femoral lesion. Sub selective arteriogram of the descending genicular artery demonstrates apparent solitary supply to the ill-defined  hypervascular lesion within the distal femoral diaphysis. This vessel was catheterized and successfully particle embolized. Completion arteriogram demonstrates no persistent enhancement of the ill-defined distal femoral lesion.  IMPRESSION: 1. Technically successful particle embolization of both the proximal and distal femoral hypervascular renal cell metastasis. 2. Wide patency of the right lower extremity arterial system to the level of the right foot, however note is made of sluggish flow to the right foot favored to be secondary to steal phenomenon from the hypervascular lesions within the right femur and proximal tibia. PLAN: - Patient will be admitted overnight for continued observation and PCA usage. - Patient to undergo definitive intramedullary femoral rod pinning tomorrow with Dr. Alvan Dame. - Note, the proximal tibial lesion is amenable to particle embolization if tibial fixation is pursued in the future. Electronically Signed   By: Sandi Mariscal M.D.   On: 02/15/2018 16:53   Ir Angiogram Selective Each Additional Vessel  Result Date: 02/15/2018 INDICATION: History of metastatic renal cell carcinoma, now with lytic expansile lesions within proximal and distal aspects of the right femur. Please perform percutaneous embolization prior to intramedullary rod fixation. EXAM: 1. ULTRASOUND GUIDANCE FOR ARTERIAL ACCESS 2. PELVIC ARTERIOGRAM 3. RIGHT LOWER EXTREMITY ARTERIOGRAM 4. SELECTIVE RIGHT DEEP FEMORAL ARTERIOGRAM 5. SELECTIVE RIGHT DEEP FEMORAL ARTERIOGRAM AND PERCUTANEOUS EMBOLIZATION. 6. SELECTIVE RIGHT SUPRA GENICULATE ARTERIOGRAM AND PERCUTANEOUS PARTICLE EMBOLIZATION. COMPARISON:  CTA run off - 02/04/2017 MEDICATIONS: Vancomycin 1 gm IV. The antibiotic was administered within 1 hour of the procedure ANESTHESIA/SEDATION: Moderate (conscious) sedation was employed during this procedure. A total of Versed 8 mg and Fentanyl 400 mcg was administered intravenously. Moderate Sedation Time: 92 minutes. The  patient's level of consciousness and vital signs were monitored continuously by radiology nursing throughout the procedure under my direct supervision. CONTRAST:  115 cc Omnipaque 300 FLUOROSCOPY TIME:  19 minutes 36 seconds (967 mGy). COMPLICATIONS: None immediate. PROCEDURE: Informed consent was obtained from the patient following explanation of the procedure, risks, benefits and alternatives. The patient understands, agrees and consents for the procedure. All questions were addressed. A time out was performed prior to the initiation of the procedure. Maximal barrier sterile technique utilized including caps, mask, sterile gowns, sterile gloves, large sterile drape, hand hygiene, and Betadine prep. The left femoral head was marked fluoroscopically. Under ultrasound guidance, the left common femoral artery was accessed with a micropuncture kit after the overlying soft tissues were anesthetized with 1% lidocaine. An ultrasound image was saved for documentation purposes. The micropuncture sheath was exchanged for a 5 [place] vascular sheath over a Bentson wire. A closure arteriogram was performed through the side of the sheath confirming access within the right common femoral artery. Over a Bentson wire, a Omni Flush catheter was advanced to the level of the inferior aspect of the abdominal aorta, back bled and flushed. Pelvic arteriogram was performed Next, with the use of a stiff Glidewire, the Omni Flush catheter was advanced into the contralateral right common femoral artery and a diagnostic right lower extremity arteriogram was performed at multiple stations to the level of the right foot. Over a stiff Glidewire, both the Omni Flush catheter and the 5 [place] vascular sheath were exchanged for a 55 cm radiopaque tip 5 [place] vascular sheath which was advanced from the level of the left common femoral artery access site to the contralateral right common femoral artery. With the use of a regular glidewire, a 4  French angled glide catheter was utilized to select several branches of the right deep femoral artery, ultimately, the ascending branch of the lateral femoral circumflex artery was selected with contrast injection demonstrating supply to the ill-defined hypervascular lesion within the proximal diaphysis of the femur.  With the use of a fathom 14 microwire, a high-flow Renegade microcatheter was advanced into the proximal aspect of the ascending branch of the lateral femoral circumflex artery with contrast injection confirming appropriate positioning. Next, the vessel was embolized to near stasis with approximately 1/10 of a vial of 300-500 micron Embospheres. The microcatheter was advanced centrally and post embolization arteriogram was performed. Microcatheter was removed and the 4 French angled glide catheter was utilized to select the descending genicular artery. Contrast injection demonstrated apparent solitary supply to the known lytic expansile lesion involving the distal diaphysis of the femur. Again, a fathom 14 microwire was utilized to advance a high-flow Renegade microcatheter into a tiny branch vessel supplying the hypervascular lesion. Contrast injection confirmed appropriate positioning and hypervascular lesion was embolized with approximately 2/10 of a vial of 300-500 micron Embospheres. The microcatheter was retracted into the origin of the vessel and post embolization arteriogram was performed. At this point, all wires, catheters and sheaths were removed from the patient. Superficial hemostasis was achieved at the left groin access site with manual compression. A dressing was placed. The patient tolerated the procedure well without immediate postprocedural complication. FINDINGS: Pelvic arteriogram demonstrates wide patency of the bilateral common, external and internal iliac arteries. Redemonstrated focal ectasia of the bilateral common iliac arteries. Right lower extremity arteriogram demonstrates  wide patency of the right common and superficial femoral arteries as well wide patency of the right above and below-knee popliteal arteries. Ill-defined hypervascular lesions are seen within the proximal and distal aspects of the right femur as well as the proximal tibia as demonstrated on preceding CTA run-off. There is a three-vessel runoff to the right lower leg and foot common however note is made of sluggish flow to the right foot favored to be secondary to steal phenomenon due to the hypervascular lesions within the femur and proximal tibia. Sub selective arteriogram of the ascending branch of lateral femoral circumflex artery demonstrated apparent solitary supply to the ill-defined hypervascular lesion involving the proximal femoral diaphysis. This vessel was catheterized and successfully particle embolized. Completion arteriogram demonstrates no persistent enhancement of the ill-defined proximal femoral lesion. Sub selective arteriogram of the descending genicular artery demonstrates apparent solitary supply to the ill-defined hypervascular lesion within the distal femoral diaphysis. This vessel was catheterized and successfully particle embolized. Completion arteriogram demonstrates no persistent enhancement of the ill-defined distal femoral lesion. IMPRESSION: 1. Technically successful particle embolization of both the proximal and distal femoral hypervascular renal cell metastasis. 2. Wide patency of the right lower extremity arterial system to the level of the right foot, however note is made of sluggish flow to the right foot favored to be secondary to steal phenomenon from the hypervascular lesions within the right femur and proximal tibia. PLAN: - Patient will be admitted overnight for continued observation and PCA usage. - Patient to undergo definitive intramedullary femoral rod pinning tomorrow with Dr. Alvan Dame. - Note, the proximal tibial lesion is amenable to particle embolization if tibial fixation  is pursued in the future. Electronically Signed   By: Sandi Mariscal M.D.   On: 02/15/2018 16:53   Ir Angiogram Selective Each Additional Vessel  Result Date: 02/15/2018 INDICATION: History of metastatic renal cell carcinoma, now with lytic expansile lesions within proximal and distal aspects of the right femur. Please perform percutaneous embolization prior to intramedullary rod fixation. EXAM: 1. ULTRASOUND GUIDANCE FOR ARTERIAL ACCESS 2. PELVIC ARTERIOGRAM 3. RIGHT LOWER EXTREMITY ARTERIOGRAM 4. SELECTIVE RIGHT DEEP FEMORAL ARTERIOGRAM 5. SELECTIVE RIGHT DEEP FEMORAL ARTERIOGRAM AND  PERCUTANEOUS EMBOLIZATION. 6. SELECTIVE RIGHT SUPRA GENICULATE ARTERIOGRAM AND PERCUTANEOUS PARTICLE EMBOLIZATION. COMPARISON:  CTA run off - 02/04/2017 MEDICATIONS: Vancomycin 1 gm IV. The antibiotic was administered within 1 hour of the procedure ANESTHESIA/SEDATION: Moderate (conscious) sedation was employed during this procedure. A total of Versed 8 mg and Fentanyl 400 mcg was administered intravenously. Moderate Sedation Time: 92 minutes. The patient's level of consciousness and vital signs were monitored continuously by radiology nursing throughout the procedure under my direct supervision. CONTRAST:  115 cc Omnipaque 300 FLUOROSCOPY TIME:  19 minutes 36 seconds (967 mGy). COMPLICATIONS: None immediate. PROCEDURE: Informed consent was obtained from the patient following explanation of the procedure, risks, benefits and alternatives. The patient understands, agrees and consents for the procedure. All questions were addressed. A time out was performed prior to the initiation of the procedure. Maximal barrier sterile technique utilized including caps, mask, sterile gowns, sterile gloves, large sterile drape, hand hygiene, and Betadine prep. The left femoral head was marked fluoroscopically. Under ultrasound guidance, the left common femoral artery was accessed with a micropuncture kit after the overlying soft tissues were  anesthetized with 1% lidocaine. An ultrasound image was saved for documentation purposes. The micropuncture sheath was exchanged for a 5 [place] vascular sheath over a Bentson wire. A closure arteriogram was performed through the side of the sheath confirming access within the right common femoral artery. Over a Bentson wire, a Omni Flush catheter was advanced to the level of the inferior aspect of the abdominal aorta, back bled and flushed. Pelvic arteriogram was performed Next, with the use of a stiff Glidewire, the Omni Flush catheter was advanced into the contralateral right common femoral artery and a diagnostic right lower extremity arteriogram was performed at multiple stations to the level of the right foot. Over a stiff Glidewire, both the Omni Flush catheter and the 5 [place] vascular sheath were exchanged for a 55 cm radiopaque tip 5 [place] vascular sheath which was advanced from the level of the left common femoral artery access site to the contralateral right common femoral artery. With the use of a regular glidewire, a 4 French angled glide catheter was utilized to select several branches of the right deep femoral artery, ultimately, the ascending branch of the lateral femoral circumflex artery was selected with contrast injection demonstrating supply to the ill-defined hypervascular lesion within the proximal diaphysis of the femur. With the use of a fathom 14 microwire, a high-flow Renegade microcatheter was advanced into the proximal aspect of the ascending branch of the lateral femoral circumflex artery with contrast injection confirming appropriate positioning. Next, the vessel was embolized to near stasis with approximately 1/10 of a vial of 300-500 micron Embospheres. The microcatheter was advanced centrally and post embolization arteriogram was performed. Microcatheter was removed and the 4 French angled glide catheter was utilized to select the descending genicular artery. Contrast injection  demonstrated apparent solitary supply to the known lytic expansile lesion involving the distal diaphysis of the femur. Again, a fathom 14 microwire was utilized to advance a high-flow Renegade microcatheter into a tiny branch vessel supplying the hypervascular lesion. Contrast injection confirmed appropriate positioning and hypervascular lesion was embolized with approximately 2/10 of a vial of 300-500 micron Embospheres. The microcatheter was retracted into the origin of the vessel and post embolization arteriogram was performed. At this point, all wires, catheters and sheaths were removed from the patient. Superficial hemostasis was achieved at the left groin access site with manual compression. A dressing was placed. The patient tolerated the  procedure well without immediate postprocedural complication. FINDINGS: Pelvic arteriogram demonstrates wide patency of the bilateral common, external and internal iliac arteries. Redemonstrated focal ectasia of the bilateral common iliac arteries. Right lower extremity arteriogram demonstrates wide patency of the right common and superficial femoral arteries as well wide patency of the right above and below-knee popliteal arteries. Ill-defined hypervascular lesions are seen within the proximal and distal aspects of the right femur as well as the proximal tibia as demonstrated on preceding CTA run-off. There is a three-vessel runoff to the right lower leg and foot common however note is made of sluggish flow to the right foot favored to be secondary to steal phenomenon due to the hypervascular lesions within the femur and proximal tibia. Sub selective arteriogram of the ascending branch of lateral femoral circumflex artery demonstrated apparent solitary supply to the ill-defined hypervascular lesion involving the proximal femoral diaphysis. This vessel was catheterized and successfully particle embolized. Completion arteriogram demonstrates no persistent enhancement of the  ill-defined proximal femoral lesion. Sub selective arteriogram of the descending genicular artery demonstrates apparent solitary supply to the ill-defined hypervascular lesion within the distal femoral diaphysis. This vessel was catheterized and successfully particle embolized. Completion arteriogram demonstrates no persistent enhancement of the ill-defined distal femoral lesion. IMPRESSION: 1. Technically successful particle embolization of both the proximal and distal femoral hypervascular renal cell metastasis. 2. Wide patency of the right lower extremity arterial system to the level of the right foot, however note is made of sluggish flow to the right foot favored to be secondary to steal phenomenon from the hypervascular lesions within the right femur and proximal tibia. PLAN: - Patient will be admitted overnight for continued observation and PCA usage. - Patient to undergo definitive intramedullary femoral rod pinning tomorrow with Dr. Alvan Dame. - Note, the proximal tibial lesion is amenable to particle embolization if tibial fixation is pursued in the future. Electronically Signed   By: Sandi Mariscal M.D.   On: 02/15/2018 16:53   Ir Angiogram Selective Each Additional Vessel  Result Date: 02/15/2018 INDICATION: History of metastatic renal cell carcinoma, now with lytic expansile lesions within proximal and distal aspects of the right femur. Please perform percutaneous embolization prior to intramedullary rod fixation. EXAM: 1. ULTRASOUND GUIDANCE FOR ARTERIAL ACCESS 2. PELVIC ARTERIOGRAM 3. RIGHT LOWER EXTREMITY ARTERIOGRAM 4. SELECTIVE RIGHT DEEP FEMORAL ARTERIOGRAM 5. SELECTIVE RIGHT DEEP FEMORAL ARTERIOGRAM AND PERCUTANEOUS EMBOLIZATION. 6. SELECTIVE RIGHT SUPRA GENICULATE ARTERIOGRAM AND PERCUTANEOUS PARTICLE EMBOLIZATION. COMPARISON:  CTA run off - 02/04/2017 MEDICATIONS: Vancomycin 1 gm IV. The antibiotic was administered within 1 hour of the procedure ANESTHESIA/SEDATION: Moderate (conscious) sedation  was employed during this procedure. A total of Versed 8 mg and Fentanyl 400 mcg was administered intravenously. Moderate Sedation Time: 92 minutes. The patient's level of consciousness and vital signs were monitored continuously by radiology nursing throughout the procedure under my direct supervision. CONTRAST:  115 cc Omnipaque 300 FLUOROSCOPY TIME:  19 minutes 36 seconds (967 mGy). COMPLICATIONS: None immediate. PROCEDURE: Informed consent was obtained from the patient following explanation of the procedure, risks, benefits and alternatives. The patient understands, agrees and consents for the procedure. All questions were addressed. A time out was performed prior to the initiation of the procedure. Maximal barrier sterile technique utilized including caps, mask, sterile gowns, sterile gloves, large sterile drape, hand hygiene, and Betadine prep. The left femoral head was marked fluoroscopically. Under ultrasound guidance, the left common femoral artery was accessed with a micropuncture kit after the overlying soft tissues were anesthetized with 1%  lidocaine. An ultrasound image was saved for documentation purposes. The micropuncture sheath was exchanged for a 5 [place] vascular sheath over a Bentson wire. A closure arteriogram was performed through the side of the sheath confirming access within the right common femoral artery. Over a Bentson wire, a Omni Flush catheter was advanced to the level of the inferior aspect of the abdominal aorta, back bled and flushed. Pelvic arteriogram was performed Next, with the use of a stiff Glidewire, the Omni Flush catheter was advanced into the contralateral right common femoral artery and a diagnostic right lower extremity arteriogram was performed at multiple stations to the level of the right foot. Over a stiff Glidewire, both the Omni Flush catheter and the 5 [place] vascular sheath were exchanged for a 55 cm radiopaque tip 5 [place] vascular sheath which was advanced from  the level of the left common femoral artery access site to the contralateral right common femoral artery. With the use of a regular glidewire, a 4 French angled glide catheter was utilized to select several branches of the right deep femoral artery, ultimately, the ascending branch of the lateral femoral circumflex artery was selected with contrast injection demonstrating supply to the ill-defined hypervascular lesion within the proximal diaphysis of the femur. With the use of a fathom 14 microwire, a high-flow Renegade microcatheter was advanced into the proximal aspect of the ascending branch of the lateral femoral circumflex artery with contrast injection confirming appropriate positioning. Next, the vessel was embolized to near stasis with approximately 1/10 of a vial of 300-500 micron Embospheres. The microcatheter was advanced centrally and post embolization arteriogram was performed. Microcatheter was removed and the 4 French angled glide catheter was utilized to select the descending genicular artery. Contrast injection demonstrated apparent solitary supply to the known lytic expansile lesion involving the distal diaphysis of the femur. Again, a fathom 14 microwire was utilized to advance a high-flow Renegade microcatheter into a tiny branch vessel supplying the hypervascular lesion. Contrast injection confirmed appropriate positioning and hypervascular lesion was embolized with approximately 2/10 of a vial of 300-500 micron Embospheres. The microcatheter was retracted into the origin of the vessel and post embolization arteriogram was performed. At this point, all wires, catheters and sheaths were removed from the patient. Superficial hemostasis was achieved at the left groin access site with manual compression. A dressing was placed. The patient tolerated the procedure well without immediate postprocedural complication. FINDINGS: Pelvic arteriogram demonstrates wide patency of the bilateral common,  external and internal iliac arteries. Redemonstrated focal ectasia of the bilateral common iliac arteries. Right lower extremity arteriogram demonstrates wide patency of the right common and superficial femoral arteries as well wide patency of the right above and below-knee popliteal arteries. Ill-defined hypervascular lesions are seen within the proximal and distal aspects of the right femur as well as the proximal tibia as demonstrated on preceding CTA run-off. There is a three-vessel runoff to the right lower leg and foot common however note is made of sluggish flow to the right foot favored to be secondary to steal phenomenon due to the hypervascular lesions within the femur and proximal tibia. Sub selective arteriogram of the ascending branch of lateral femoral circumflex artery demonstrated apparent solitary supply to the ill-defined hypervascular lesion involving the proximal femoral diaphysis. This vessel was catheterized and successfully particle embolized. Completion arteriogram demonstrates no persistent enhancement of the ill-defined proximal femoral lesion. Sub selective arteriogram of the descending genicular artery demonstrates apparent solitary supply to the ill-defined hypervascular lesion within the distal  femoral diaphysis. This vessel was catheterized and successfully particle embolized. Completion arteriogram demonstrates no persistent enhancement of the ill-defined distal femoral lesion. IMPRESSION: 1. Technically successful particle embolization of both the proximal and distal femoral hypervascular renal cell metastasis. 2. Wide patency of the right lower extremity arterial system to the level of the right foot, however note is made of sluggish flow to the right foot favored to be secondary to steal phenomenon from the hypervascular lesions within the right femur and proximal tibia. PLAN: - Patient will be admitted overnight for continued observation and PCA usage. - Patient to undergo  definitive intramedullary femoral rod pinning tomorrow with Dr. Alvan Dame. - Note, the proximal tibial lesion is amenable to particle embolization if tibial fixation is pursued in the future. Electronically Signed   By: Sandi Mariscal M.D.   On: 02/15/2018 16:53   Ir Angiogram Selective Each Additional Vessel  Result Date: 02/15/2018 INDICATION: History of metastatic renal cell carcinoma, now with lytic expansile lesions within proximal and distal aspects of the right femur. Please perform percutaneous embolization prior to intramedullary rod fixation. EXAM: 1. ULTRASOUND GUIDANCE FOR ARTERIAL ACCESS 2. PELVIC ARTERIOGRAM 3. RIGHT LOWER EXTREMITY ARTERIOGRAM 4. SELECTIVE RIGHT DEEP FEMORAL ARTERIOGRAM 5. SELECTIVE RIGHT DEEP FEMORAL ARTERIOGRAM AND PERCUTANEOUS EMBOLIZATION. 6. SELECTIVE RIGHT SUPRA GENICULATE ARTERIOGRAM AND PERCUTANEOUS PARTICLE EMBOLIZATION. COMPARISON:  CTA run off - 02/04/2017 MEDICATIONS: Vancomycin 1 gm IV. The antibiotic was administered within 1 hour of the procedure ANESTHESIA/SEDATION: Moderate (conscious) sedation was employed during this procedure. A total of Versed 8 mg and Fentanyl 400 mcg was administered intravenously. Moderate Sedation Time: 92 minutes. The patient's level of consciousness and vital signs were monitored continuously by radiology nursing throughout the procedure under my direct supervision. CONTRAST:  115 cc Omnipaque 300 FLUOROSCOPY TIME:  19 minutes 36 seconds (967 mGy). COMPLICATIONS: None immediate. PROCEDURE: Informed consent was obtained from the patient following explanation of the procedure, risks, benefits and alternatives. The patient understands, agrees and consents for the procedure. All questions were addressed. A time out was performed prior to the initiation of the procedure. Maximal barrier sterile technique utilized including caps, mask, sterile gowns, sterile gloves, large sterile drape, hand hygiene, and Betadine prep. The left femoral head was marked  fluoroscopically. Under ultrasound guidance, the left common femoral artery was accessed with a micropuncture kit after the overlying soft tissues were anesthetized with 1% lidocaine. An ultrasound image was saved for documentation purposes. The micropuncture sheath was exchanged for a 5 [place] vascular sheath over a Bentson wire. A closure arteriogram was performed through the side of the sheath confirming access within the right common femoral artery. Over a Bentson wire, a Omni Flush catheter was advanced to the level of the inferior aspect of the abdominal aorta, back bled and flushed. Pelvic arteriogram was performed Next, with the use of a stiff Glidewire, the Omni Flush catheter was advanced into the contralateral right common femoral artery and a diagnostic right lower extremity arteriogram was performed at multiple stations to the level of the right foot. Over a stiff Glidewire, both the Omni Flush catheter and the 5 [place] vascular sheath were exchanged for a 55 cm radiopaque tip 5 [place] vascular sheath which was advanced from the level of the left common femoral artery access site to the contralateral right common femoral artery. With the use of a regular glidewire, a 4 French angled glide catheter was utilized to select several branches of the right deep femoral artery, ultimately, the ascending branch of the lateral  femoral circumflex artery was selected with contrast injection demonstrating supply to the ill-defined hypervascular lesion within the proximal diaphysis of the femur. With the use of a fathom 14 microwire, a high-flow Renegade microcatheter was advanced into the proximal aspect of the ascending branch of the lateral femoral circumflex artery with contrast injection confirming appropriate positioning. Next, the vessel was embolized to near stasis with approximately 1/10 of a vial of 300-500 micron Embospheres. The microcatheter was advanced centrally and post embolization arteriogram was  performed. Microcatheter was removed and the 4 French angled glide catheter was utilized to select the descending genicular artery. Contrast injection demonstrated apparent solitary supply to the known lytic expansile lesion involving the distal diaphysis of the femur. Again, a fathom 14 microwire was utilized to advance a high-flow Renegade microcatheter into a tiny branch vessel supplying the hypervascular lesion. Contrast injection confirmed appropriate positioning and hypervascular lesion was embolized with approximately 2/10 of a vial of 300-500 micron Embospheres. The microcatheter was retracted into the origin of the vessel and post embolization arteriogram was performed. At this point, all wires, catheters and sheaths were removed from the patient. Superficial hemostasis was achieved at the left groin access site with manual compression. A dressing was placed. The patient tolerated the procedure well without immediate postprocedural complication. FINDINGS: Pelvic arteriogram demonstrates wide patency of the bilateral common, external and internal iliac arteries. Redemonstrated focal ectasia of the bilateral common iliac arteries. Right lower extremity arteriogram demonstrates wide patency of the right common and superficial femoral arteries as well wide patency of the right above and below-knee popliteal arteries. Ill-defined hypervascular lesions are seen within the proximal and distal aspects of the right femur as well as the proximal tibia as demonstrated on preceding CTA run-off. There is a three-vessel runoff to the right lower leg and foot common however note is made of sluggish flow to the right foot favored to be secondary to steal phenomenon due to the hypervascular lesions within the femur and proximal tibia. Sub selective arteriogram of the ascending branch of lateral femoral circumflex artery demonstrated apparent solitary supply to the ill-defined hypervascular lesion involving the proximal  femoral diaphysis. This vessel was catheterized and successfully particle embolized. Completion arteriogram demonstrates no persistent enhancement of the ill-defined proximal femoral lesion. Sub selective arteriogram of the descending genicular artery demonstrates apparent solitary supply to the ill-defined hypervascular lesion within the distal femoral diaphysis. This vessel was catheterized and successfully particle embolized. Completion arteriogram demonstrates no persistent enhancement of the ill-defined distal femoral lesion. IMPRESSION: 1. Technically successful particle embolization of both the proximal and distal femoral hypervascular renal cell metastasis. 2. Wide patency of the right lower extremity arterial system to the level of the right foot, however note is made of sluggish flow to the right foot favored to be secondary to steal phenomenon from the hypervascular lesions within the right femur and proximal tibia. PLAN: - Patient will be admitted overnight for continued observation and PCA usage. - Patient to undergo definitive intramedullary femoral rod pinning tomorrow with Dr. Alvan Dame. - Note, the proximal tibial lesion is amenable to particle embolization if tibial fixation is pursued in the future. Electronically Signed   By: Sandi Mariscal M.D.   On: 02/15/2018 16:53   Ct Angio Ao+bifem W & Or Wo Contrast  Result Date: 02/09/2018 CLINICAL DATA:  History of metastatic renal cell carcinoma, now with lesions involving the right femur. Please perform CT angiogram to evaluate for arterial supply prior to bland embolization prior to definitive intramedullary rod  pinning. EXAM: CT ANGIOGRAPHY OF ABDOMINAL AORTA WITH ILIOFEMORAL RUNOFF TECHNIQUE: Multidetector CT imaging of the abdomen, pelvis and lower extremities was performed using the standard protocol during bolus administration of intravenous contrast. Multiplanar CT image reconstructions and MIPs were obtained to evaluate the vascular anatomy.  CONTRAST:  177m [id number] IOPAMIDOL (ISOVUE-370) INJECTION 76% COMPARISON:  CT of the chest, abdomen and pelvis - 01/20/2018; right femur MRI - 01/20/2018 FINDINGS: VASCULAR Aorta: Very minimal amount of mixed calcified though largely noncalcified atherosclerotic plaque with a normal caliber abdominal aorta, not resulting in hemodynamically significant stenosis. No abdominal aortic dissection or periaortic stranding. Celiac: Widely patent without hemodynamically significant narrowing. Conventional branching pattern. SMA: Widely patent without hemodynamically significant narrowing. Conventional branching pattern. The distal tributaries the SMA appear widely patent without discrete intraluminal filling defect to suggest distal embolism. Renals: The left renal artery is surgically absent. The right renal artery is widely patent without hemodynamically significant narrowing. No vessel irregularity to suggest FMD. Note is made of mild ectasia of the distal aspect of the right renal artery at the level of its bifurcation measuring approximately 0.9 cm (image 66, series 5). IMA: Widely patent without a hemodynamically significant narrowing. RIGHT Lower Extremity Inflow: There is a minimal amount of calcified atherosclerotic plaque within the right common iliac artery, not resulting in a hemodynamically significant stenosis. Note is made of an approximately 0.7 x 0.4 cm penetrating atherosclerotic ulcer in arising from the medial aspect of the right common iliac artery (image 99, series 4) no perivascular stranding. The right internal and external iliac arteries are of normal caliber and widely patent without hemodynamically significant narrowing. Outflow: The right common, deep and superficial femoral arteries are widely patent without hemodynamically significant narrowing. The right above and below-knee popliteal arteries are widely patent without hemodynamically significant narrowing. The lesion within the proximal  aspect of the right femur appears to be supplied from a tiny branch vessel which arises directly from the origin of the right superficial femoral artery (image 34, series 11), while the lesion within the distal aspect the right femur appears to be supplied by 2 adjacent hypertrophied suprageniculate arterial collaterals (axial image 141, series 11, coronal image 159, series 12 and axial image 170, series 11; coronal image 64, series 12). The dominant lesion within right tibial plateau appears to be supplied via an infrageniculate arterial collateral (axial image 180, series 11). Runoff: Three-vessel runoff to the right lower leg and foot. Right-sided dorsalis pedis artery is patent to the level of the forefoot. LEFT Lower Extremity Inflow: The left common and external iliac arteries are widely patent without hemodynamically significant narrowing. Minimal amount of atherosclerotic plaque within a normal caliber left internal iliac artery Outflow: The left common, deep and superficial femoral arteries appear widely patent without hemodynamically significant narrowing. The left above and below-knee popliteal artery is widely patent without hemodynamically significant narrowing. Runoff: 3 vessel runoff to the left lower leg and foot, however the left anterior tibial artery is noted to occlude at the level of the left ankle mortise. A left-sided dorsalis pedis artery is not identified. Veins: The pelvic venous system and IVC appear widely patent on this arterial phase examination. Review of the MIP images confirms the above findings. NON-VASCULAR Lower chest: Limited visualization of lower thorax demonstrates grossly unchanged appearance of the slightly spiculated approximately 2.2 x 1.6 cm nodular opacity within the right middle lobe (image 1, series 10) as well as the less well-defined approximately 2.4 x 1.0 cm subpleural nodular opacity within the  imaged left lower lobe (image 13, series 10). Stable postsurgical  change of the image right lower lobe. Normal heart size.  No pericardial effusion. Hepatobiliary: Normal hepatic contour. No discrete hepatic lesions. Normal appearance of the gallbladder given degree distention. No radiopaque gallstones. No intra extrahepatic bili duct dilatation. No ascites. Pancreas: Known enhancing pancreatic metastases are grossly unchanged with index lesion within mid body of the pancreas measuring approximately 2.5 x 1.7 cm (image 40, series 4), with associated downstream pancreatic ductal dilatation and atrophy. Exophytic lesion arising from the pancreatic head/neck junction is also unchanged measuring approximately 3.1 x 2.2 cm (image 49, series 4). No peripancreatic stranding. Spleen: Normal appearance of the spleen Adrenals/Urinary Tract: Stable sequela of prior left nephrectomy without evidence of locally recurrent disease. There is homogeneous enhancement of the solitary remaining right kidney. No discrete right-sided sided renal lesions. No definite renal stones this postcontrast examination. No urine obstruction or perinephric stranding. Normal appearance the bilateral adrenal glands. Normal appearance of the urinary bladder given degree distention. Stomach/Bowel: Moderate colonic stool burden without evidence of enteric obstruction. The bowel is normal in course and caliber without wall thickening. No pneumoperitoneum, pneumatosis or portal venous gas. Lymphatic: No bulky retroperitoneal, mesenteric, pelvic or inguinal lymphadenopathy. Reproductive: Normal appearance the prostate gland. No free fluid in the pelvic cul-de-sac. Other: Spinal stimulator device is imbedded within the soft tissues of the ventral aspect of the left lower abdomen. Musculoskeletal: Infiltrative lytic lesion involving the right-side of the sacrum is grossly unchanged measuring approximately 4.0 x 3.6 cm (115, series 4). Grossly unchanged appearance of the approximately 2.1 x 1.1 cm lytic lesion involving the  right inferior pubic ramus (156, series 4). Lytic lesion involving the proximal diaphysis of the right femur is grossly unchanged measuring approximately 2.7 x 3.0 x 3.9 cm (image 42, series 11, coronal image 59, series 5). Lytic, expansile lesion involving the distal aspect of the right femoral diaphysis is grossly unchanged measuring approximately 4.3 x 2.5 x 5.6 cm (axial image 153, series 11; coronal image 48, series 12). Not seen on the right femoral MRI performed 01/20/2018 is an approximately 6.2 x 4.2 x 5.7 cm lytic lesion involving the proximal aspect of the right tibia (image 195, series 11; coronal image 52, series 12). This lesion appears to abut the subarticular surface of the tibial plateau as well as the base of the tibial spines (representative image 50, series 12). Note is made of an approximately 3.0 x 2.1 x 4.8 cm lytic expansile lesion involving the left fibula (axial image 240, series 11, coronal image 60, series 12). IMPRESSION: VASCULAR 1. Very minimal amount of atherosclerotic plaque within a normal caliber abdominal aorta, not resulting in a hemodynamically significant stenosis. Aortic Atherosclerosis (ICD10-I70.0). 2. Arterial supply involving the right femoral and tibial lesions as detailed above. NON-VASCULAR 1. Stable sequela of prior left-sided nephrectomy without evidence of local recurrence. 2. Osseous metastatic disease involving the sacrum, right inferior pubic ramus, right femur and tibia and the left fibula as detailed above. Note, the lesion involving the proximal aspect the right tibia appears to extend to involve the subarticular surface of the tibial plateau. 3. Grossly unchanged appearance of known metastatic disease involving the imaged bilateral lung bases as well as the pancreas as detailed above, similar to the 01/20/2018 examination. Electronically Signed   By: Sandi Mariscal M.D.   On: 02/09/2018 10:54   Pelvis Portable  Result Date: 02/16/2018 CLINICAL DATA:  Postop  EXAM: PORTABLE PELVIS 1-2 VIEWS COMPARISON:  02/16/2018,  02/04/2018 FINDINGS: Pubic symphysis is intact. Lytic lesion in the inferior pubic ramus on the right. Status post intramedullary rodding of the proximal right femur with normal alignment. Gas in the soft tissues of the lateral thigh. Metallic device over the left lower quadrant. Lytic lesion in the proximal femur is partially obscured by the intramedullary rod. IMPRESSION: 1. Status post intramedullary rodding of the right femur with normal alignment. Expected postoperative gas in the lateral hip and proximal thigh 2. Lytic lesion in the right inferior ramus and proximal right femur corresponding to skeletal metastases noted on prior imaging. Electronically Signed   By: Donavan Foil M.D.   On: 02/16/2018 18:56   Ir US Guide Vasc Access Left  Result Date: 02/15/2018 INDICATION: History of metastatic renal cell carcinoma, now with lytic expansile lesions within proximal and distal aspects of the right femur. Please perform percutaneous embolization prior to intramedullary rod fixation. EXAM: 1. ULTRASOUND GUIDANCE FOR ARTERIAL ACCESS 2. PELVIC ARTERIOGRAM 3. RIGHT LOWER EXTREMITY ARTERIOGRAM 4. SELECTIVE RIGHT DEEP FEMORAL ARTERIOGRAM 5. SELECTIVE RIGHT DEEP FEMORAL ARTERIOGRAM AND PERCUTANEOUS EMBOLIZATION. 6. SELECTIVE RIGHT SUPRA GENICULATE ARTERIOGRAM AND PERCUTANEOUS PARTICLE EMBOLIZATION. COMPARISON:  CTA run off - 02/04/2017 MEDICATIONS: Vancomycin 1 gm IV. The antibiotic was administered within 1 hour of the procedure ANESTHESIA/SEDATION: Moderate (conscious) sedation was employed during this procedure. A total of Versed 8 mg and Fentanyl 400 mcg was administered intravenously. Moderate Sedation Time: 92 minutes. The patient's level of consciousness and vital signs were monitored continuously by radiology nursing throughout the procedure under my direct supervision. CONTRAST:  115 cc Omnipaque 300 FLUOROSCOPY TIME:  19 minutes 36 seconds (967 mGy).  COMPLICATIONS: None immediate. PROCEDURE: Informed consent was obtained from the patient following explanation of the procedure, risks, benefits and alternatives. The patient understands, agrees and consents for the procedure. All questions were addressed. A time out was performed prior to the initiation of the procedure. Maximal barrier sterile technique utilized including caps, mask, sterile gowns, sterile gloves, large sterile drape, hand hygiene, and Betadine prep. The left femoral head was marked fluoroscopically. Under ultrasound guidance, the left common femoral artery was accessed with a micropuncture kit after the overlying soft tissues were anesthetized with 1% lidocaine. An ultrasound image was saved for documentation purposes. The micropuncture sheath was exchanged for a 5 [place] vascular sheath over a Bentson wire. A closure arteriogram was performed through the side of the sheath confirming access within the right common femoral artery. Over a Bentson wire, a Omni Flush catheter was advanced to the level of the inferior aspect of the abdominal aorta, back bled and flushed. Pelvic arteriogram was performed Next, with the use of a stiff Glidewire, the Omni Flush catheter was advanced into the contralateral right common femoral artery and a diagnostic right lower extremity arteriogram was performed at multiple stations to the level of the right foot. Over a stiff Glidewire, both the Omni Flush catheter and the 5 [place] vascular sheath were exchanged for a 55 cm radiopaque tip 5 [place] vascular sheath which was advanced from the level of the left common femoral artery access site to the contralateral right common femoral artery. With the use of a regular glidewire, a 4 French angled glide catheter was utilized to select several branches of the right deep femoral artery, ultimately, the ascending branch of the lateral femoral circumflex artery was selected with contrast injection demonstrating supply to the  ill-defined hypervascular lesion within the proximal diaphysis of the femur. With the use of a fathom 14 microwire,  a high-flow Renegade microcatheter was advanced into the proximal aspect of the ascending branch of the lateral femoral circumflex artery with contrast injection confirming appropriate positioning. Next, the vessel was embolized to near stasis with approximately 1/10 of a vial of 300-500 micron Embospheres. The microcatheter was advanced centrally and post embolization arteriogram was performed. Microcatheter was removed and the 4 French angled glide catheter was utilized to select the descending genicular artery. Contrast injection demonstrated apparent solitary supply to the known lytic expansile lesion involving the distal diaphysis of the femur. Again, a fathom 14 microwire was utilized to advance a high-flow Renegade microcatheter into a tiny branch vessel supplying the hypervascular lesion. Contrast injection confirmed appropriate positioning and hypervascular lesion was embolized with approximately 2/10 of a vial of 300-500 micron Embospheres. The microcatheter was retracted into the origin of the vessel and post embolization arteriogram was performed. At this point, all wires, catheters and sheaths were removed from the patient. Superficial hemostasis was achieved at the left groin access site with manual compression. A dressing was placed. The patient tolerated the procedure well without immediate postprocedural complication. FINDINGS: Pelvic arteriogram demonstrates wide patency of the bilateral common, external and internal iliac arteries. Redemonstrated focal ectasia of the bilateral common iliac arteries. Right lower extremity arteriogram demonstrates wide patency of the right common and superficial femoral arteries as well wide patency of the right above and below-knee popliteal arteries. Ill-defined hypervascular lesions are seen within the proximal and distal aspects of the right femur  as well as the proximal tibia as demonstrated on preceding CTA run-off. There is a three-vessel runoff to the right lower leg and foot common however note is made of sluggish flow to the right foot favored to be secondary to steal phenomenon due to the hypervascular lesions within the femur and proximal tibia. Sub selective arteriogram of the ascending branch of lateral femoral circumflex artery demonstrated apparent solitary supply to the ill-defined hypervascular lesion involving the proximal femoral diaphysis. This vessel was catheterized and successfully particle embolized. Completion arteriogram demonstrates no persistent enhancement of the ill-defined proximal femoral lesion. Sub selective arteriogram of the descending genicular artery demonstrates apparent solitary supply to the ill-defined hypervascular lesion within the distal femoral diaphysis. This vessel was catheterized and successfully particle embolized. Completion arteriogram demonstrates no persistent enhancement of the ill-defined distal femoral lesion. IMPRESSION: 1. Technically successful particle embolization of both the proximal and distal femoral hypervascular renal cell metastasis. 2. Wide patency of the right lower extremity arterial system to the level of the right foot, however note is made of sluggish flow to the right foot favored to be secondary to steal phenomenon from the hypervascular lesions within the right femur and proximal tibia. PLAN: - Patient will be admitted overnight for continued observation and PCA usage. - Patient to undergo definitive intramedullary femoral rod pinning tomorrow with Dr. Alvan Dame. - Note, the proximal tibial lesion is amenable to particle embolization if tibial fixation is pursued in the future. Electronically Signed   By: Sandi Mariscal M.D.   On: 02/15/2018 16:53   Dg C-arm 1-60 Min-no Report  Result Date: 02/16/2018 Fluoroscopy was utilized by the requesting physician.  No radiographic interpretation.    Ir Embo Tumor Organ Ischemia Infarct Inc Guide Roadmapping  Result Date: 02/15/2018 INDICATION: History of metastatic renal cell carcinoma, now with lytic expansile lesions within proximal and distal aspects of the right femur. Please perform percutaneous embolization prior to intramedullary rod fixation. EXAM: 1. ULTRASOUND GUIDANCE FOR ARTERIAL ACCESS 2. PELVIC ARTERIOGRAM 3.  RIGHT LOWER EXTREMITY ARTERIOGRAM 4. SELECTIVE RIGHT DEEP FEMORAL ARTERIOGRAM 5. SELECTIVE RIGHT DEEP FEMORAL ARTERIOGRAM AND PERCUTANEOUS EMBOLIZATION. 6. SELECTIVE RIGHT SUPRA GENICULATE ARTERIOGRAM AND PERCUTANEOUS PARTICLE EMBOLIZATION. COMPARISON:  CTA run off - 02/04/2017 MEDICATIONS: Vancomycin 1 gm IV. The antibiotic was administered within 1 hour of the procedure ANESTHESIA/SEDATION: Moderate (conscious) sedation was employed during this procedure. A total of Versed 8 mg and Fentanyl 400 mcg was administered intravenously. Moderate Sedation Time: 92 minutes. The patient's level of consciousness and vital signs were monitored continuously by radiology nursing throughout the procedure under my direct supervision. CONTRAST:  115 cc Omnipaque 300 FLUOROSCOPY TIME:  19 minutes 36 seconds (967 mGy). COMPLICATIONS: None immediate. PROCEDURE: Informed consent was obtained from the patient following explanation of the procedure, risks, benefits and alternatives. The patient understands, agrees and consents for the procedure. All questions were addressed. A time out was performed prior to the initiation of the procedure. Maximal barrier sterile technique utilized including caps, mask, sterile gowns, sterile gloves, large sterile drape, hand hygiene, and Betadine prep. The left femoral head was marked fluoroscopically. Under ultrasound guidance, the left common femoral artery was accessed with a micropuncture kit after the overlying soft tissues were anesthetized with 1% lidocaine. An ultrasound image was saved for documentation purposes.  The micropuncture sheath was exchanged for a 5 [place] vascular sheath over a Bentson wire. A closure arteriogram was performed through the side of the sheath confirming access within the right common femoral artery. Over a Bentson wire, a Omni Flush catheter was advanced to the level of the inferior aspect of the abdominal aorta, back bled and flushed. Pelvic arteriogram was performed Next, with the use of a stiff Glidewire, the Omni Flush catheter was advanced into the contralateral right common femoral artery and a diagnostic right lower extremity arteriogram was performed at multiple stations to the level of the right foot. Over a stiff Glidewire, both the Omni Flush catheter and the 5 [place] vascular sheath were exchanged for a 55 cm radiopaque tip 5 [place] vascular sheath which was advanced from the level of the left common femoral artery access site to the contralateral right common femoral artery. With the use of a regular glidewire, a 4 French angled glide catheter was utilized to select several branches of the right deep femoral artery, ultimately, the ascending branch of the lateral femoral circumflex artery was selected with contrast injection demonstrating supply to the ill-defined hypervascular lesion within the proximal diaphysis of the femur. With the use of a fathom 14 microwire, a high-flow Renegade microcatheter was advanced into the proximal aspect of the ascending branch of the lateral femoral circumflex artery with contrast injection confirming appropriate positioning. Next, the vessel was embolized to near stasis with approximately 1/10 of a vial of 300-500 micron Embospheres. The microcatheter was advanced centrally and post embolization arteriogram was performed. Microcatheter was removed and the 4 French angled glide catheter was utilized to select the descending genicular artery. Contrast injection demonstrated apparent solitary supply to the known lytic expansile lesion involving the  distal diaphysis of the femur. Again, a fathom 14 microwire was utilized to advance a high-flow Renegade microcatheter into a tiny branch vessel supplying the hypervascular lesion. Contrast injection confirmed appropriate positioning and hypervascular lesion was embolized with approximately 2/10 of a vial of 300-500 micron Embospheres. The microcatheter was retracted into the origin of the vessel and post embolization arteriogram was performed. At this point, all wires, catheters and sheaths were removed from the patient. Superficial hemostasis was achieved  at the left groin access site with manual compression. A dressing was placed. The patient tolerated the procedure well without immediate postprocedural complication. FINDINGS: Pelvic arteriogram demonstrates wide patency of the bilateral common, external and internal iliac arteries. Redemonstrated focal ectasia of the bilateral common iliac arteries. Right lower extremity arteriogram demonstrates wide patency of the right common and superficial femoral arteries as well wide patency of the right above and below-knee popliteal arteries. Ill-defined hypervascular lesions are seen within the proximal and distal aspects of the right femur as well as the proximal tibia as demonstrated on preceding CTA run-off. There is a three-vessel runoff to the right lower leg and foot common however note is made of sluggish flow to the right foot favored to be secondary to steal phenomenon due to the hypervascular lesions within the femur and proximal tibia. Sub selective arteriogram of the ascending branch of lateral femoral circumflex artery demonstrated apparent solitary supply to the ill-defined hypervascular lesion involving the proximal femoral diaphysis. This vessel was catheterized and successfully particle embolized. Completion arteriogram demonstrates no persistent enhancement of the ill-defined proximal femoral lesion. Sub selective arteriogram of the descending  genicular artery demonstrates apparent solitary supply to the ill-defined hypervascular lesion within the distal femoral diaphysis. This vessel was catheterized and successfully particle embolized. Completion arteriogram demonstrates no persistent enhancement of the ill-defined distal femoral lesion. IMPRESSION: 1. Technically successful particle embolization of both the proximal and distal femoral hypervascular renal cell metastasis. 2. Wide patency of the right lower extremity arterial system to the level of the right foot, however note is made of sluggish flow to the right foot favored to be secondary to steal phenomenon from the hypervascular lesions within the right femur and proximal tibia. PLAN: - Patient will be admitted overnight for continued observation and PCA usage. - Patient to undergo definitive intramedullary femoral rod pinning tomorrow with Dr. Alvan Dame. - Note, the proximal tibial lesion is amenable to particle embolization if tibial fixation is pursued in the future. Electronically Signed   By: Sandi Mariscal M.D.   On: 02/15/2018 16:53   Ir Embo Tumor Organ Ischemia Infarct Inc Guide Roadmapping  Result Date: 02/15/2018 INDICATION: History of metastatic renal cell carcinoma, now with lytic expansile lesions within proximal and distal aspects of the right femur. Please perform percutaneous embolization prior to intramedullary rod fixation. EXAM: 1. ULTRASOUND GUIDANCE FOR ARTERIAL ACCESS 2. PELVIC ARTERIOGRAM 3. RIGHT LOWER EXTREMITY ARTERIOGRAM 4. SELECTIVE RIGHT DEEP FEMORAL ARTERIOGRAM 5. SELECTIVE RIGHT DEEP FEMORAL ARTERIOGRAM AND PERCUTANEOUS EMBOLIZATION. 6. SELECTIVE RIGHT SUPRA GENICULATE ARTERIOGRAM AND PERCUTANEOUS PARTICLE EMBOLIZATION. COMPARISON:  CTA run off - 02/04/2017 MEDICATIONS: Vancomycin 1 gm IV. The antibiotic was administered within 1 hour of the procedure ANESTHESIA/SEDATION: Moderate (conscious) sedation was employed during this procedure. A total of Versed 8 mg and  Fentanyl 400 mcg was administered intravenously. Moderate Sedation Time: 92 minutes. The patient's level of consciousness and vital signs were monitored continuously by radiology nursing throughout the procedure under my direct supervision. CONTRAST:  115 cc Omnipaque 300 FLUOROSCOPY TIME:  19 minutes 36 seconds (967 mGy). COMPLICATIONS: None immediate. PROCEDURE: Informed consent was obtained from the patient following explanation of the procedure, risks, benefits and alternatives. The patient understands, agrees and consents for the procedure. All questions were addressed. A time out was performed prior to the initiation of the procedure. Maximal barrier sterile technique utilized including caps, mask, sterile gowns, sterile gloves, large sterile drape, hand hygiene, and Betadine prep. The left femoral head was marked fluoroscopically. Under ultrasound guidance,  the left common femoral artery was accessed with a micropuncture kit after the overlying soft tissues were anesthetized with 1% lidocaine. An ultrasound image was saved for documentation purposes. The micropuncture sheath was exchanged for a 5 [place] vascular sheath over a Bentson wire. A closure arteriogram was performed through the side of the sheath confirming access within the right common femoral artery. Over a Bentson wire, a Omni Flush catheter was advanced to the level of the inferior aspect of the abdominal aorta, back bled and flushed. Pelvic arteriogram was performed Next, with the use of a stiff Glidewire, the Omni Flush catheter was advanced into the contralateral right common femoral artery and a diagnostic right lower extremity arteriogram was performed at multiple stations to the level of the right foot. Over a stiff Glidewire, both the Omni Flush catheter and the 5 [place] vascular sheath were exchanged for a 55 cm radiopaque tip 5 [place] vascular sheath which was advanced from the level of the left common femoral artery access site to the  contralateral right common femoral artery. With the use of a regular glidewire, a 4 French angled glide catheter was utilized to select several branches of the right deep femoral artery, ultimately, the ascending branch of the lateral femoral circumflex artery was selected with contrast injection demonstrating supply to the ill-defined hypervascular lesion within the proximal diaphysis of the femur. With the use of a fathom 14 microwire, a high-flow Renegade microcatheter was advanced into the proximal aspect of the ascending branch of the lateral femoral circumflex artery with contrast injection confirming appropriate positioning. Next, the vessel was embolized to near stasis with approximately 1/10 of a vial of 300-500 micron Embospheres. The microcatheter was advanced centrally and post embolization arteriogram was performed. Microcatheter was removed and the 4 French angled glide catheter was utilized to select the descending genicular artery. Contrast injection demonstrated apparent solitary supply to the known lytic expansile lesion involving the distal diaphysis of the femur. Again, a fathom 14 microwire was utilized to advance a high-flow Renegade microcatheter into a tiny branch vessel supplying the hypervascular lesion. Contrast injection confirmed appropriate positioning and hypervascular lesion was embolized with approximately 2/10 of a vial of 300-500 micron Embospheres. The microcatheter was retracted into the origin of the vessel and post embolization arteriogram was performed. At this point, all wires, catheters and sheaths were removed from the patient. Superficial hemostasis was achieved at the left groin access site with manual compression. A dressing was placed. The patient tolerated the procedure well without immediate postprocedural complication. FINDINGS: Pelvic arteriogram demonstrates wide patency of the bilateral common, external and internal iliac arteries. Redemonstrated focal ectasia of  the bilateral common iliac arteries. Right lower extremity arteriogram demonstrates wide patency of the right common and superficial femoral arteries as well wide patency of the right above and below-knee popliteal arteries. Ill-defined hypervascular lesions are seen within the proximal and distal aspects of the right femur as well as the proximal tibia as demonstrated on preceding CTA run-off. There is a three-vessel runoff to the right lower leg and foot common however note is made of sluggish flow to the right foot favored to be secondary to steal phenomenon due to the hypervascular lesions within the femur and proximal tibia. Sub selective arteriogram of the ascending branch of lateral femoral circumflex artery demonstrated apparent solitary supply to the ill-defined hypervascular lesion involving the proximal femoral diaphysis. This vessel was catheterized and successfully particle embolized. Completion arteriogram demonstrates no persistent enhancement of the ill-defined proximal femoral lesion.  Sub selective arteriogram of the descending genicular artery demonstrates apparent solitary supply to the ill-defined hypervascular lesion within the distal femoral diaphysis. This vessel was catheterized and successfully particle embolized. Completion arteriogram demonstrates no persistent enhancement of the ill-defined distal femoral lesion. IMPRESSION: 1. Technically successful particle embolization of both the proximal and distal femoral hypervascular renal cell metastasis. 2. Wide patency of the right lower extremity arterial system to the level of the right foot, however note is made of sluggish flow to the right foot favored to be secondary to steal phenomenon from the hypervascular lesions within the right femur and proximal tibia. PLAN: - Patient will be admitted overnight for continued observation and PCA usage. - Patient to undergo definitive intramedullary femoral rod pinning tomorrow with Dr. Alvan Dame. - Note,  the proximal tibial lesion is amenable to particle embolization if tibial fixation is pursued in the future. Electronically Signed   By: Sandi Mariscal M.D.   On: 02/15/2018 16:53      IMPRESSION/PLAN: 1. 52 y.o. man with painful proximal and distal right femur metastasis from renal cell cancer - Stage IV, now 2 weeks s/p prophylactic IM nailing of the right femur. Today, we talked to the patient and family about the findings and workup thus far. We discussed the natural history of metastatic renal cell carcinoma with painful bony metastasis and general treatment, highlighting the role of radiotherapy in the management. We discussed the available radiation techniques, and focused on the details of logistics and delivery. The recommendation is for 30 Gy in 10 fractions to the right proximal and distal femur over the course of 2 weeks. We reviewed the anticipated acute and late sequelae associated with radiation in this setting. The patient was encouraged to ask questions that were answered to his satisfaction.  At the conclusion of our conversation, the patient elects to proceed with palliative adjuvant radiotherapy to the right femur. He freely signed written consent to proceed and a copy of this document is placed in his medical record.  He will undergo CT Simulation for radiation planning today following our visit and we anticipate beginning treatments in the next 2 weeks, once his incisions are fully healed.  He has a follow up visit with Dr. Alvan Dame today so we will await clearance to proceed.    Nicholos Johns, PA-C    Tyler Pita, MD  [facility] Direct Dial: [phone]  Fax: [phone] [email or web address]  Skype  LinkedIn

## 2018-03-03 NOTE — Progress Notes (Signed)
Keith [name] 52 yo man with painful proximal and distal right femur metastasis from renal cell cancer - Stage IV, FU.  Having pain right leg 9/10 taking Dilaudid.  Reports having fatigue. Had surgery to right leg had a rod placement and took out bone marrow last Tuesday, 02-23-18 Dr. Alvan Dame. Ambulatory without a cane today.  Has a follow up appointment with Dr. Alvan Dame today. Wt Readings from Last 3 Encounters:  No data found for Wt  BP 128/90 (BP Location: Left Arm, Patient Position: Sitting, Cuff Size: Normal)   Pulse (!) 111   Temp 97.6 F (36.4 C) (Oral)   Resp 20   Ht 6\' 3"  (1.905 m)   Wt 182 lb 9.6 oz (82.8 kg)   SpO2 100%   BMI 22.82 kg/m

## 2018-03-05 DIAGNOSIS — C7951 Secondary malignant neoplasm of bone: Secondary | ICD-10-CM | POA: Diagnosis not present

## 2018-03-15 ENCOUNTER — Ambulatory Visit
Admission: RE | Admit: 2018-03-15 | Discharge: 2018-03-15 | Disposition: A | Discharge status: Home / Self Care | Payer: Medicare Other | Source: Ambulatory Visit | Attending: Radiation Oncology | Admitting: Radiation Oncology

## 2018-03-15 DIAGNOSIS — C7951 Secondary malignant neoplasm of bone: Secondary | ICD-10-CM | POA: Diagnosis not present

## 2018-03-16 ENCOUNTER — Ambulatory Visit
Admission: RE | Admit: 2018-03-16 | Discharge: 2018-03-16 | Disposition: A | Discharge status: Home / Self Care | Payer: Medicare Other | Source: Ambulatory Visit | Attending: Radiation Oncology | Admitting: Radiation Oncology

## 2018-03-16 DIAGNOSIS — C7951 Secondary malignant neoplasm of bone: Secondary | ICD-10-CM | POA: Diagnosis not present

## 2018-03-17 ENCOUNTER — Ambulatory Visit
Admission: RE | Admit: 2018-03-17 | Discharge: 2018-03-17 | Disposition: A | Discharge status: Home / Self Care | Payer: Medicare Other | Source: Ambulatory Visit | Attending: Radiation Oncology | Admitting: Radiation Oncology

## 2018-03-17 DIAGNOSIS — Z9689 Presence of other specified functional implants: Secondary | ICD-10-CM | POA: Diagnosis not present

## 2018-03-17 DIAGNOSIS — G620 Drug-induced polyneuropathy: Secondary | ICD-10-CM | POA: Diagnosis not present

## 2018-03-17 DIAGNOSIS — R030 Elevated blood-pressure reading, without diagnosis of hypertension: Secondary | ICD-10-CM | POA: Diagnosis not present

## 2018-03-17 DIAGNOSIS — C7951 Secondary malignant neoplasm of bone: Secondary | ICD-10-CM | POA: Diagnosis not present

## 2018-03-17 DIAGNOSIS — C642 Malignant neoplasm of left kidney, except renal pelvis: Secondary | ICD-10-CM | POA: Diagnosis not present

## 2018-03-18 ENCOUNTER — Ambulatory Visit
Admission: RE | Admit: 2018-03-18 | Discharge: 2018-03-18 | Disposition: A | Discharge status: Home / Self Care | Payer: Medicare Other | Source: Ambulatory Visit | Attending: Radiation Oncology | Admitting: Radiation Oncology

## 2018-03-18 DIAGNOSIS — C7951 Secondary malignant neoplasm of bone: Secondary | ICD-10-CM | POA: Diagnosis not present

## 2018-03-18 MED ORDER — RADIAPLEXRX EX GEL
Freq: Once | CUTANEOUS | Status: AC
  Administered 2018-03-18: 14:00:00 via TOPICAL

## 2018-03-19 ENCOUNTER — Ambulatory Visit
Admission: RE | Admit: 2018-03-19 | Discharge: 2018-03-19 | Disposition: A | Discharge status: Home / Self Care | Payer: Medicare Other | Source: Ambulatory Visit | Attending: Radiation Oncology | Admitting: Radiation Oncology

## 2018-03-19 DIAGNOSIS — C7951 Secondary malignant neoplasm of bone: Secondary | ICD-10-CM | POA: Diagnosis not present

## 2018-03-22 ENCOUNTER — Ambulatory Visit
Admission: RE | Admit: 2018-03-22 | Discharge: 2018-03-22 | Disposition: A | Discharge status: Home / Self Care | Payer: Medicare Other | Source: Ambulatory Visit | Attending: Radiation Oncology | Admitting: Radiation Oncology

## 2018-03-22 DIAGNOSIS — C7951 Secondary malignant neoplasm of bone: Secondary | ICD-10-CM | POA: Diagnosis not present

## 2018-03-22 DIAGNOSIS — Z510 Encounter for antineoplastic radiation therapy: Secondary | ICD-10-CM | POA: Diagnosis not present

## 2018-03-22 DIAGNOSIS — C649 Malignant neoplasm of unspecified kidney, except renal pelvis: Secondary | ICD-10-CM | POA: Diagnosis not present

## 2018-03-23 ENCOUNTER — Ambulatory Visit
Admission: RE | Admit: 2018-03-23 | Discharge: 2018-03-23 | Disposition: A | Discharge status: Home / Self Care | Payer: Medicare Other | Source: Ambulatory Visit | Attending: Radiation Oncology | Admitting: Radiation Oncology

## 2018-03-23 DIAGNOSIS — Z510 Encounter for antineoplastic radiation therapy: Secondary | ICD-10-CM | POA: Diagnosis not present

## 2018-03-23 DIAGNOSIS — C7951 Secondary malignant neoplasm of bone: Secondary | ICD-10-CM | POA: Diagnosis not present

## 2018-03-23 DIAGNOSIS — C649 Malignant neoplasm of unspecified kidney, except renal pelvis: Secondary | ICD-10-CM | POA: Diagnosis not present

## 2018-03-24 ENCOUNTER — Ambulatory Visit
Admission: RE | Admit: 2018-03-24 | Discharge: 2018-03-24 | Disposition: A | Discharge status: Home / Self Care | Payer: Medicare Other | Source: Ambulatory Visit | Attending: Radiation Oncology | Admitting: Radiation Oncology

## 2018-03-24 ENCOUNTER — Encounter: Payer: Self-pay | Admitting: *Deleted

## 2018-03-24 ENCOUNTER — Other Ambulatory Visit: Payer: Self-pay | Admitting: *Deleted

## 2018-03-24 DIAGNOSIS — C7951 Secondary malignant neoplasm of bone: Secondary | ICD-10-CM | POA: Diagnosis not present

## 2018-03-24 DIAGNOSIS — Z510 Encounter for antineoplastic radiation therapy: Secondary | ICD-10-CM | POA: Diagnosis not present

## 2018-03-24 DIAGNOSIS — C649 Malignant neoplasm of unspecified kidney, except renal pelvis: Secondary | ICD-10-CM | POA: Diagnosis not present

## 2018-03-24 MED ORDER — CABOZANTINIB S-MALATE 60 MG PO TABS: 60.0000 mg | ORAL_TABLET | Freq: Every day | ORAL | 0 refills | Status: DC

## 2018-03-25 ENCOUNTER — Other Ambulatory Visit: Payer: Self-pay | Admitting: *Deleted

## 2018-03-25 ENCOUNTER — Ambulatory Visit
Admission: RE | Admit: 2018-03-25 | Discharge: 2018-03-25 | Disposition: A | Discharge status: Home / Self Care | Payer: Medicare Other | Source: Ambulatory Visit | Attending: Radiation Oncology | Admitting: Radiation Oncology

## 2018-03-25 DIAGNOSIS — F419 Anxiety disorder, unspecified: Secondary | ICD-10-CM

## 2018-03-25 DIAGNOSIS — C649 Malignant neoplasm of unspecified kidney, except renal pelvis: Secondary | ICD-10-CM | POA: Diagnosis not present

## 2018-03-25 DIAGNOSIS — C7951 Secondary malignant neoplasm of bone: Secondary | ICD-10-CM | POA: Diagnosis not present

## 2018-03-25 DIAGNOSIS — Z510 Encounter for antineoplastic radiation therapy: Secondary | ICD-10-CM | POA: Diagnosis not present

## 2018-03-25 DIAGNOSIS — K611 Rectal abscess: Secondary | ICD-10-CM

## 2018-03-25 DIAGNOSIS — K8689 Other specified diseases of pancreas: Secondary | ICD-10-CM

## 2018-03-25 MED ORDER — GABAPENTIN 300 MG PO CAPS: 300.0000 mg | ORAL_CAPSULE | Freq: Three times a day (TID) | ORAL | 0 refills | Status: DC

## 2018-03-25 MED ORDER — DULOXETINE HCL 60 MG PO CPEP: 60.0000 mg | ORAL_CAPSULE | Freq: Two times a day (BID) | ORAL | 0 refills | Status: DC

## 2018-03-25 MED ORDER — ALPRAZOLAM 1 MG PO TABS: ORAL_TABLET | ORAL | 0 refills | Status: DC

## 2018-03-26 ENCOUNTER — Ambulatory Visit: Admission: RE | Admit: 2018-03-26 | Payer: Medicare Other | Source: Ambulatory Visit

## 2018-03-29 ENCOUNTER — Ambulatory Visit: Payer: Medicare Other

## 2018-03-30 ENCOUNTER — Ambulatory Visit
Admission: RE | Admit: 2018-03-30 | Discharge: 2018-03-30 | Disposition: A | Discharge status: Home / Self Care | Payer: Medicare Other | Source: Ambulatory Visit | Attending: Radiation Oncology | Admitting: Radiation Oncology

## 2018-03-30 ENCOUNTER — Encounter: Payer: Self-pay | Admitting: Radiation Oncology

## 2018-03-30 DIAGNOSIS — Z510 Encounter for antineoplastic radiation therapy: Secondary | ICD-10-CM | POA: Diagnosis not present

## 2018-03-30 DIAGNOSIS — C649 Malignant neoplasm of unspecified kidney, except renal pelvis: Secondary | ICD-10-CM | POA: Diagnosis not present

## 2018-03-30 DIAGNOSIS — C7951 Secondary malignant neoplasm of bone: Secondary | ICD-10-CM | POA: Diagnosis not present

## 2018-03-31 NOTE — Progress Notes (Signed)
°  Radiation Oncology         [phone] ________________________________  Name: Keith [name] MRN: [id number]  Date: 03/30/2018  DOB: May 24, 1966  End of Treatment Note  Diagnosis:   52 y.o. male with painful 3.8 cm right femur metastasis from renal cell cancer - Stage IV     Indication for treatment:  Palliative       Radiation treatment dates:   03/15/2018 - 03/30/2018  Site/dose:   Right Femur / 30 Gy in 10 fractions  Beams/energy:   Isodose Plan / 6X-FFF Photon  Narrative: The patient tolerated radiation treatment relatively well.   He experienced mild-moderate fatigue and continued right leg pain but noted improved ambulation without the use of a cane. He denied any skin changes in the treated area.   Plan: The patient has completed radiation treatment. The patient will return to radiation oncology clinic for routine followup in one month. I advised him to call or return sooner if he has any questions or concerns related to his recovery or treatment. ________________________________  Sheral Apley. Tammi Klippel, M.D.  This document serves as a record of services personally performed by Tyler Pita, MD. It was created on his behalf by Rae Lips, a trained medical scribe. The creation of this record is based on the scribe's personal observations and the provider's statements to them. This document has been checked and approved by the attending provider.

## 2018-04-06 ENCOUNTER — Inpatient Hospital Stay: Payer: Medicare Other

## 2018-04-06 ENCOUNTER — Inpatient Hospital Stay: Payer: Medicare Other | Attending: Oncology | Admitting: Oncology

## 2018-04-06 ENCOUNTER — Telehealth: Payer: Self-pay

## 2018-04-06 VITALS — BP 130/84 | HR 87 | Temp 97.9°F | Resp 17 | Ht 75.0 in | Wt 182.6 lb

## 2018-04-06 DIAGNOSIS — I2699 Other pulmonary embolism without acute cor pulmonale: Secondary | ICD-10-CM | POA: Diagnosis not present

## 2018-04-06 DIAGNOSIS — C7801 Secondary malignant neoplasm of right lung: Secondary | ICD-10-CM

## 2018-04-06 DIAGNOSIS — F418 Other specified anxiety disorders: Secondary | ICD-10-CM | POA: Diagnosis not present

## 2018-04-06 DIAGNOSIS — C7951 Secondary malignant neoplasm of bone: Secondary | ICD-10-CM | POA: Diagnosis not present

## 2018-04-06 DIAGNOSIS — Z7901 Long term (current) use of anticoagulants: Secondary | ICD-10-CM | POA: Insufficient documentation

## 2018-04-06 DIAGNOSIS — G893 Neoplasm related pain (acute) (chronic): Secondary | ICD-10-CM | POA: Diagnosis not present

## 2018-04-06 DIAGNOSIS — C649 Malignant neoplasm of unspecified kidney, except renal pelvis: Secondary | ICD-10-CM

## 2018-04-06 LAB — COMPREHENSIVE METABOLIC PANEL
ALT: 24 U/L (ref 17–63)
AST: 21 U/L (ref 15–41)
Albumin: 3.3 g/dL — ABNORMAL LOW (ref 3.5–5.0)
Alkaline Phosphatase: 96 U/L (ref 38–126)
Anion gap: 7 (ref 5–15)
BUN: 11 mg/dL (ref 6–20)
CO2: 30 mmol/L (ref 22–32)
Calcium: 8.6 mg/dL — ABNORMAL LOW (ref 8.9–10.3)
Chloride: 103 mmol/L (ref 101–111)
Creatinine, Ser: 0.99 mg/dL (ref 0.61–1.24)
GFR calc Af Amer: >60 mL/min (ref >60)
GFR calc non Af Amer: >60 mL/min (ref >60)
Glucose, Bld: 119 mg/dL — ABNORMAL HIGH (ref 65–99)
Potassium: 5.2 mmol/L — ABNORMAL HIGH (ref 3.5–5.1)
Sodium: 140 mmol/L (ref 135–145)
Total Bilirubin: 0.2 mg/dL — ABNORMAL LOW (ref 0.3–1.2)
Total Protein: 6.9 g/dL (ref 6.5–8.1)

## 2018-04-06 LAB — CBC WITH DIFFERENTIAL (CANCER CENTER ONLY)
Basophils Absolute: 0 10*3/uL (ref 0.0–0.1)
Basophils Relative: 1 %
Eosinophils Absolute: 0.2 10*3/uL (ref 0.0–0.5)
Eosinophils Relative: 5 %
HCT: 34.5 % — ABNORMAL LOW (ref 38.4–49.9)
Hemoglobin: 11.1 g/dL — ABNORMAL LOW (ref 13.0–17.1)
Lymphocytes Relative: 17 %
Lymphs Abs: 0.8 10*3/uL — ABNORMAL LOW (ref 0.9–3.3)
MCH: 31.5 pg (ref 27.2–33.4)
MCHC: 32.2 g/dL (ref 32.0–36.0)
MCV: 98 fL (ref 79.3–98.0)
Monocytes Absolute: 0.3 10*3/uL (ref 0.1–0.9)
Monocytes Relative: 7 %
Neutro Abs: 3.4 10*3/uL (ref 1.5–6.5)
Neutrophils Relative %: 70 %
Platelet Count: 155 10*3/uL (ref 140–400)
RBC: 3.52 MIL/uL — ABNORMAL LOW (ref 4.20–5.82)
RDW: 18.2 % — ABNORMAL HIGH (ref 11.0–14.6)
WBC Count: 4.8 10*3/uL (ref 4.0–10.3)

## 2018-04-06 MED ORDER — DIAZEPAM 5 MG PO TABS: 5.0000 mg | ORAL_TABLET | Freq: Four times a day (QID) | ORAL | 0 refills | Status: DC | PRN

## 2018-04-06 NOTE — Progress Notes (Signed)
Hematology and Oncology Follow Up Visit  Keith [name] [id number] [birth year] 52 y.o. 04/06/2018 2:43 PM  [name], MD  [name], M.D.  [name], MD  [name], MD    Principle Diagnosis: 52 year old man with renal cell carcinoma diagnosed in 2009 with metastatic disease to the lung and bone.    Prior Therapy: 1. Status post laparoscopic radical nephrectomy.  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  2. Patient status post thoracotomy for a synchronous metastatic lung lesions done October 2009.   3. Patient is status post stereotactic radiotherapy to pulmonary nodules in May of 2010. 4. He is S/P Sutent 50 mg 4 weeks on 2 weeks off from 10/2010 to 03/2013. He progressed at that time.  5. He is S/P radiation to the right sacral bone between 4/22 to 4/30.  6. He is S/P XRT to the left shoulder 03/20/14 to 03/31/14. 7. Votrient 800 mg by mouth daily from 03/2013 through 06/22/2015. Discontinued secondary to disease progression. 8. Nivolumab 3 mg/kg given every 2 weeks started on 06/29/2015. He is status post 4 cycles completed 08/10/2015. He developed disease progression in September 2016.  9. Status post radiation therapy to the left mid fibula completed on 11/14/2015. He received a grade 1 fraction. 10. He is status post radiation to the proximal and distal femur over 2 weeks and 10 fractions of total of 30 Gy.  This was completed in March 2019.   Current therapy:  Cabometyx 60 mg daily started in November 2016.   Interim History:  Keith [name] is here for a follow-up.  Since the last visit, he completed radiation therapy to right leg without any significant improvement in his pain as of yet.  He is prescribed pain medication via the pain clinic but he ran out of his medicines which she will receive this Friday.  His pain is predominantly in the distal right femur and has been rather constant.  The pain does not interfere with his ability to  perform activities of daily living.  Still able to drive and work outside.  He is eating well and maintaining reasonable performance status and quality of life.  He continues to have issues with anxiety and restlessness.  Ativan is not helping him at this time.  He denies any peripheral neuropathy and continues on Cymbalta.  He takes Cabometyx without any recent complications related to this medication.  He denies any diarrhea, rash or excessive fatigue.  He did not report any headaches blurred vision or double vision.  He does not report any chest pain, palpitation, orthopnea or leg edema.  He does not report any cough, wheezing or hemoptysis.  Does not report any nausea, vomiting or abdominal pain.  Does not report any hematuria. He does not report any rashes or lesions or petechiae. He does not report any lymphadenopathy.   His mood remained stable.  He does not report any heat or cold intolerance.  Remainder of his review of system is negative.   Medications: I have reviewed the patient's current medications.  Current Outpatient Medications  Medication Sig Dispense Refill  . cabozantinib (CABOMETYX) 60 MG tablet Take 1 tablet (60 mg total) by mouth daily. With at least 8 ounces of water on an empty stomach. Do not eat for 2 hours before or 1 hour after. Swallow whole. 30 tablet 0  . calcium carbonate (TUMS - DOSED IN MG ELEMENTAL CALCIUM) 500 MG chewable tablet Chew 1 tablet by  mouth as needed for indigestion or heartburn.    . diazepam (VALIUM) 5 MG tablet Take 1 tablet (5 mg total) by mouth every 6 (six) hours as needed for anxiety. 90 tablet 0  . diphenoxylate-atropine (LOMOTIL) 2.5-0.025 MG tablet Take 1 tablet by mouth 4 (four) times daily as needed for diarrhea or loose stools. 60 tablet 0  . DULoxetine (CYMBALTA) 60 MG capsule Take 1 capsule (60 mg total) by mouth 2 (two) times daily. 180 capsule 0  . ferrous sulfate 325 (65 FE) MG tablet Take 1 tablet (325 mg total) by mouth 3 (three)  times daily after meals.  3  . gabapentin (NEURONTIN) 300 MG capsule Take 1 capsule (300 mg total) by mouth 3 (three) times daily. 270 capsule 0  . HYDROmorphone (DILAUDID) 8 MG tablet Take 1 tablet (8 mg total) by mouth every 4 (four) hours as needed for severe pain. (Patient not taking: Reported on 03/03/2018) 30 tablet 0  . rivaroxaban (XARELTO) 20 MG TABS tablet Take 1 tablet (20 mg total) daily with supper by mouth. 90 tablet 6  . tiZANidine (ZANAFLEX) 4 MG tablet Take 1 tablet (4 mg total) by mouth every 8 (eight) hours as needed for muscle spasms. 30 tablet 0   No current facility-administered medications for this visit.      Allergies:  Allergies  Allergen Reactions  . Ceftriaxone Hives  . Hydrocodone Swelling    Past Medical History, Surgical history, Social history, and Family History were reviewed and updated.   Physical Exam: Blood pressure 130/84, pulse 87, temperature 97.9 F (36.6 C), temperature source Oral, resp. rate 17, height 6\' 3"  (1.905 m), weight 182 lb 9.6 oz (82.8 kg), SpO2 98 %. ECOG: 1 General appearance: Alert, awake gentleman without distress. Head: Atraumatic without abnormalities. Oral mucosa: Mucous membranes are moist and pink. Eyes: Sclera anicteric. Lymph nodes: Cervical, supraclavicular, and axillary nodes all within normal range without any enlargement or tenderness. Heart: Regular rate and rhythm without any murmurs or gallops. Lung: Clear in all lung fields without any wheezes or dullness to percussion. Abdomen: Soft, not distended without any rebound or guarding. Skin: No skin rashes or lesions. Musculoskeletal: No erythema or induration noted in his right femur.  Full range of motion noted his right hip and knee. Neuro: No motor or sensory deficits. Psychiatric: Appears anxious although mood appeared appropriate.  Lab Results: Lab Results  Component Value Date   WBC 4.8 04/06/2018   HGB 11.7 (L) 02/17/2018   HCT 34.5 (L) 04/06/2018    MCV 98.0 04/06/2018   PLT 155 04/06/2018    Impression and Plan:  52 year old man with  1.  Metastatic renal cell carcinoma clear cell histology with disease to the bone and lung.  He was originally diagnosed in 2009 and status post multiple therapies.  He continues to receive Cabometyx 60 mg daily without any complications.  His staging workup in January 2019 showed reasonable response to therapy.  Risks and benefits of continuing this medication was discussed today and is agreeable to continue.  Plan is to repeat imaging studies and June 2019.  2.  Right femur metastasis: He is status post fixation followed by adjuvant radiation therapy.  Continues to have issues with pain but I anticipate improvement in the near future.  3.  Pulmonary embolism: He is on Xarelto without any recent thrombosis or bleeding.  4.  Anxiety: He continues to have issues with anxiety and depression despite being on Cymbalta and Ativan.  I will switch  to Valium for better control of his anxiety.  5.  Diarrhea: No issues reported at this time.  Continue to educate about potential complications related to this medication.  6.  Chronic pain: Related to metastatic disease to the bone.  He follows with the pain clinic and will have his pain medication refilled there.  7.  Prognosis: His performance status remains excellent to treatment is palliative.  8.  Follow-up: We will be in 4 to 6 weeks.  25  minutes was spent with the patient face-to-face today.  More than 50% of time was dedicated to patient counseling, education and answering questions regarding his diagnosis, prognosis as well as future plan of care.     Zola Button, MD 04/06/18

## 2018-04-06 NOTE — Telephone Encounter (Signed)
Printed calender will  Mail to patient. Per 4/16 los

## 2018-04-08 DIAGNOSIS — Z4789 Encounter for other orthopedic aftercare: Secondary | ICD-10-CM | POA: Diagnosis not present

## 2018-04-21 IMAGING — CT CT CHEST W/ CM
2 of 6 series · 14 of 46 positions shown, 16 images · IV contrast (ISOVUE)
Comparison: Multiple exams, including 01/29/2016

CLINICAL DATA: Left renal cancer metastatic to lung, bone, in the
pancreatic region. Prior radiation therapy and chemotherapy. Ongoing
oral chemotherapy.

EXAM:
CT CHEST, ABDOMEN, AND PELVIS WITH CONTRAST
TECHNIQUE: Multidetector CT imaging of the chest, abdomen and pelvis was
performed following the standard protocol during bolus
administration of intravenous contrast.
CONTRAST:  100mL NF7DEX-USS IOPAMIDOL (NF7DEX-USS) INJECTION 61%

[Series 2: cap with st · axial · 0.90mm/px · z∈[-678,-78]mm · 11 of 144 slices shown, 13 images]
[im 12/144  soft-tissue]
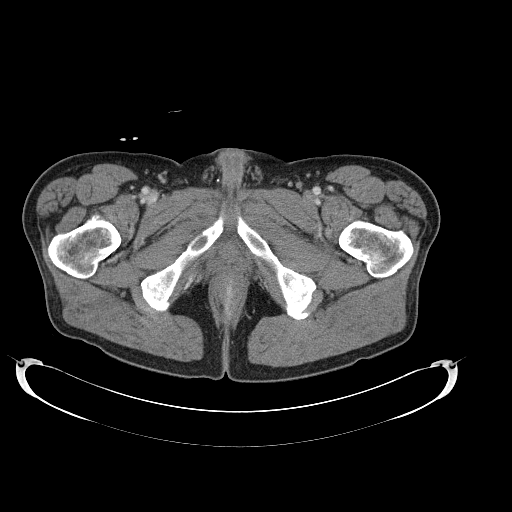
[im 12/144  bone]
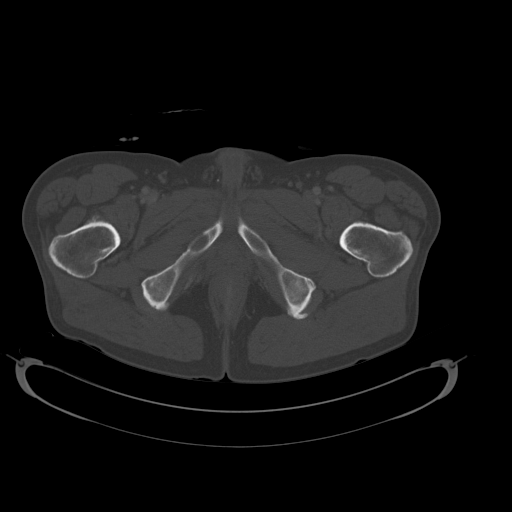
[im 24/144  soft-tissue]
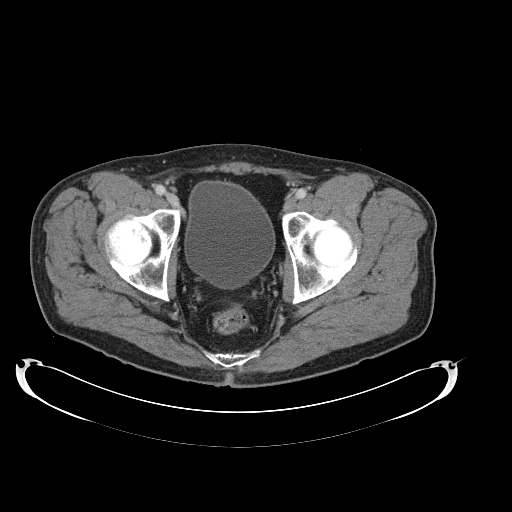
[im 36/144  soft-tissue]
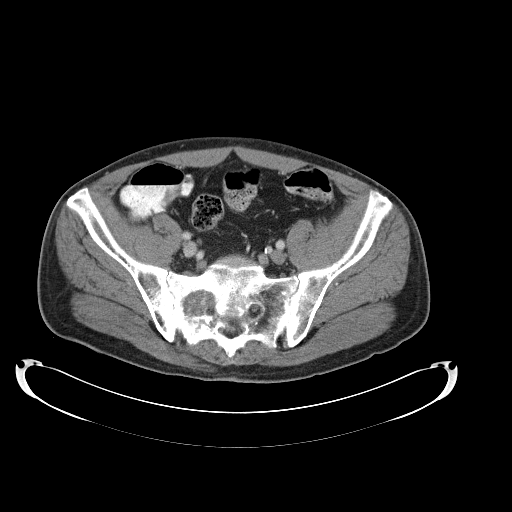
[im 48/144  soft-tissue]
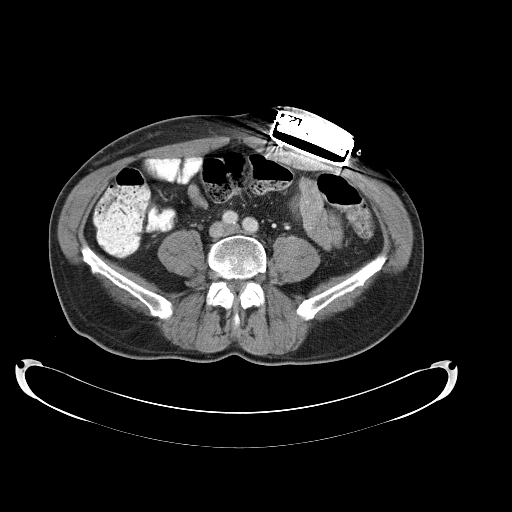
[im 60/144  soft-tissue]
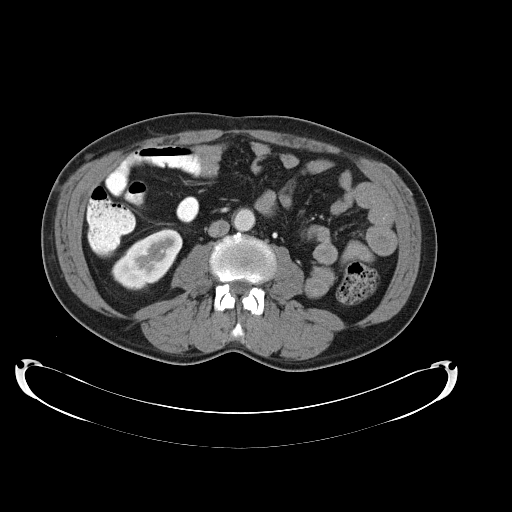
[im 72/144  soft-tissue]
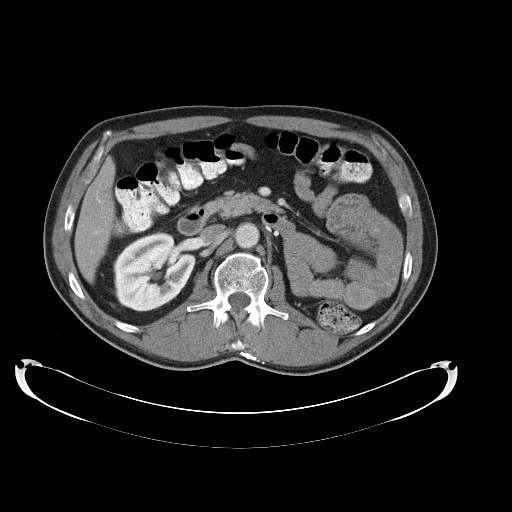
[im 84/144  soft-tissue]
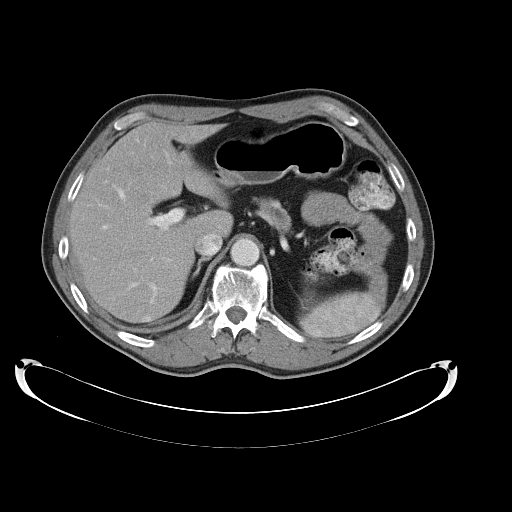
[im 96/144  soft-tissue]
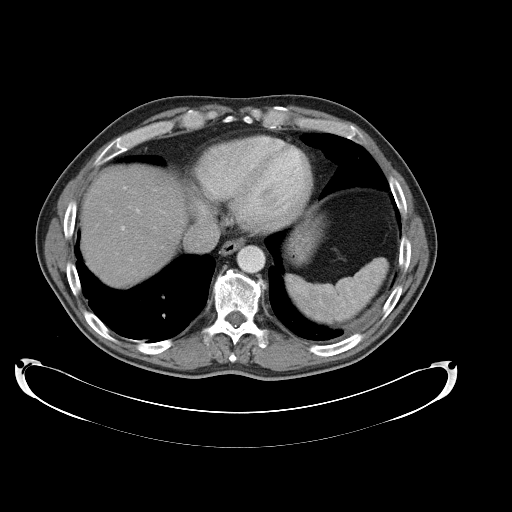
[im 108/144  soft-tissue]
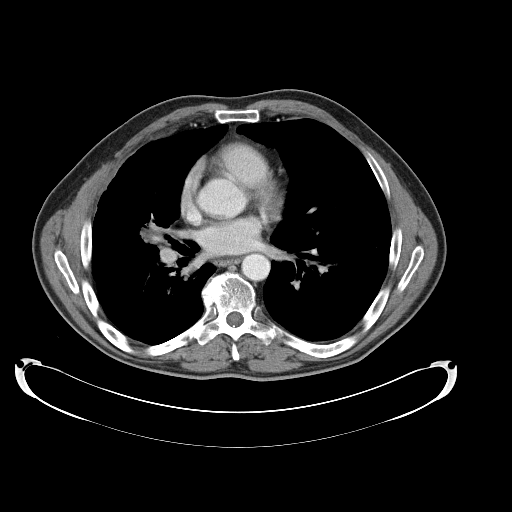
[im 108/144  bone]
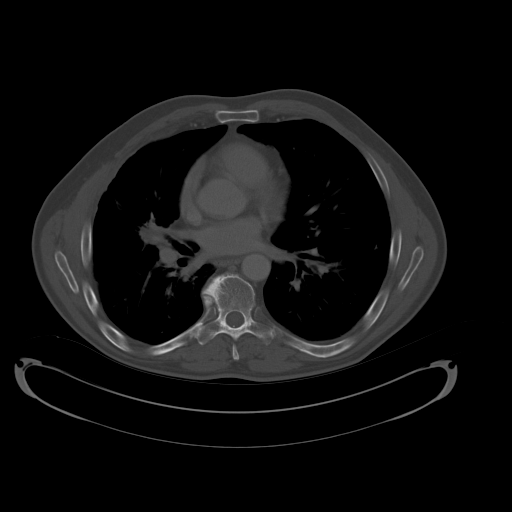
[im 120/144  soft-tissue]
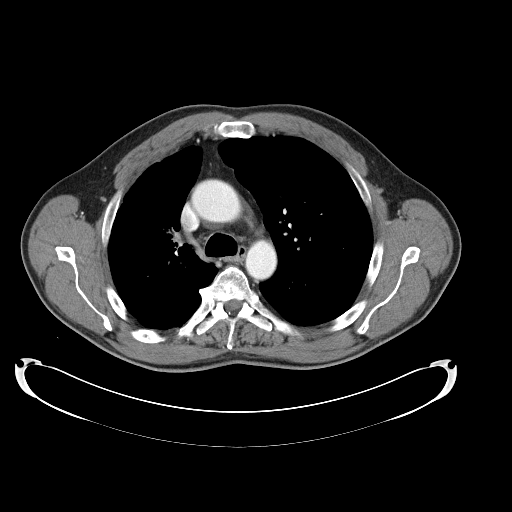
[im 132/144  soft-tissue]
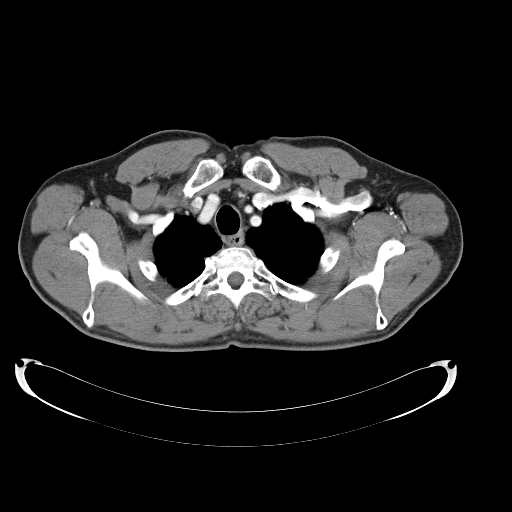

[Series 602: coronal images · coronal · 1.40mm/px · 3 of 146 slices shown]
[im 49/146  soft-tissue]
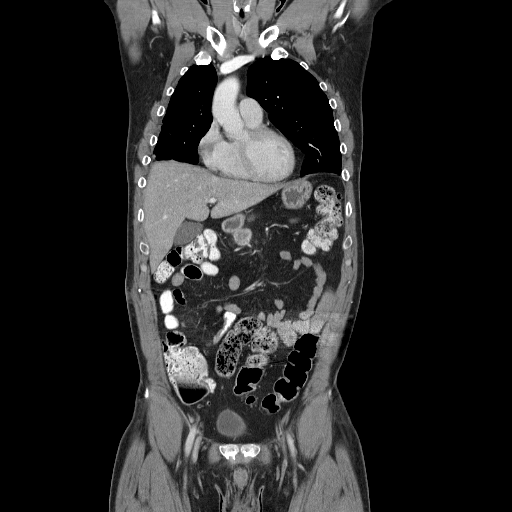
[im 65/146  soft-tissue]
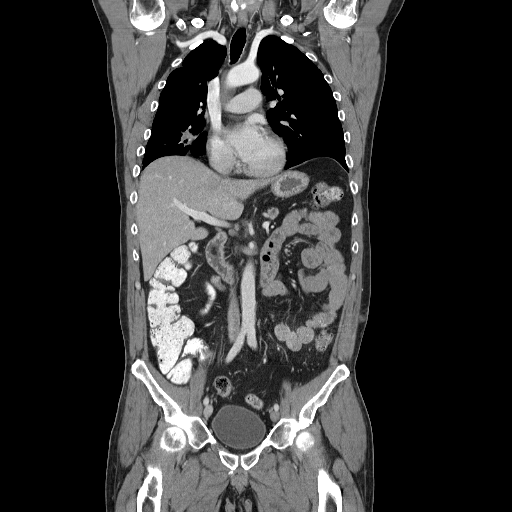
[im 81/146  soft-tissue]
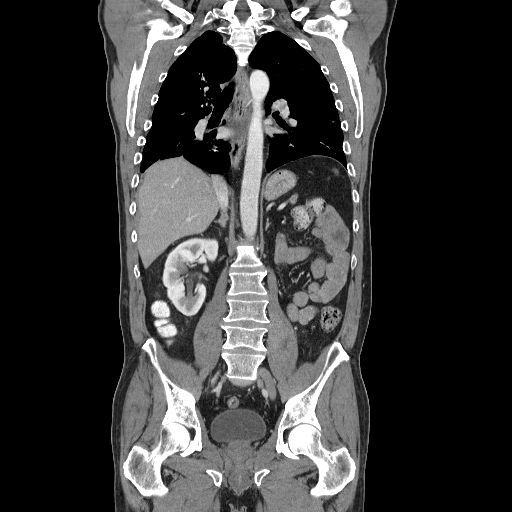

[14 of 46 positions shown; findings below may reference images not displayed]

FINDINGS: CT CHEST FINDINGS

Cardiovascular: Ascending thoracic aortic aneurysm at 4.1 cm.

Mediastinum/Nodes: No thoracic adenopathy identified.

Lungs/Pleura: Severe emphysema.

Right apical pleural parenchymal scarring with nodular component
unchanged. Irregular volume loss and nodularity in the right
suprahilar region, no change, with bandlike density extending to the
posterior pleural surface on image 53 of series 4 not changed in
appearance from 01/29/2016.

Minimal subpleural nodularity in the right upper lobe laterally on
image 74/4, unchanged.

Right middle lobe infrahilar density measuring up to 2.3 cm is an
oblique transverse diameter, previously the same with associated
volume loss in the right middle lobe. Wedge resections in the right
lower lobe.

On the left side, there is a reduction in the airspace opacity
anteriorly in the left lower lobe for example is the S measuring
by 1.2 cm on image 86 series 4, previously approximately 2.4 by
cm. Peripheral triangular densities peripherally in the left lower
lobe are also slightly reduced in size from prior, with an upper
component measuring 1.4 by 1.0 cm is and a lower component measuring
approximately 2.9 by 1.4 cm (previously 3.8 by 1.8 cm).

Bilateral airway thickening.

Musculoskeletal: Stable sclerotic deformity of the right fourth rib
posteriorly. Thoracic spondylosis. Stable cortical thickening in the
right seventh rib medially.

CT ABDOMEN PELVIS FINDINGS

Hepatobiliary: Diffuse hepatic steatosis.  Gallbladder unremarkable.

Pancreas: Once again we demonstrate abnormal small enhancing
pancreatic nodules in addition to a more prominent nodule of the
junction of the pancreatic body and head anteriorly. This more
prominent mass measures 2.9 by 2.4 cm on image 67/2, by my
measurement was the same on 01/29/2016. Smaller solid nodules are
present along the margins of the pancreatic body including a 1.4 by
0.9 cm nodule on image 63/2 which by my measurements was previously
the same. Dorsal pancreatic duct dilatation.

Spleen: Unremarkable

Adrenals/Urinary Tract: Adrenal glands normal. Left nephrectomy. No
right renal tumor. No stones identified. Urinary bladder
unremarkable.

Stomach/Bowel: Unremarkable.  Appendix normal.

Vascular/Lymphatic: Mild iliac artery atherosclerotic calcification.
No adenopathy identified.

Reproductive: Unremarkable

Other: Infiltrative nodular foci of stranding in the right anterior
abdomen, possibly from injections. Pain pump along the left anterior
subcutaneous tissues, catheter extending around into the spinal
canal and terminating in the thoracic region.

Musculoskeletal: Lytic lesion adjacent to the right S1 foramen in
the sacrum, 3.3 by 2.5 cm, previously the same by my measurements.
Surrounding sclerosis in the right iliac bone and along both
sacroiliac joints. Lytic lesion in the right inferior pubic ramus,
stable. Lytic lesion anteriorly in the intertrochanteric region of
the right hip, may predispose to right hip fracture. Overall these
appear stable.
IMPRESSION: 1. By my measurements the regions of nodular volume loss in the
right and left lung, the pancreatic mass is, and the osseous
metastatic lesions involving the right fourth rib, right sacrum,
intertrochanteric region of the right hip, and right inferior pubic
ramus are stable in size and appearance, aside from some mild
reduction in size of the lesions of the left lung base.
2. Small ascending aortic aneurysm. Recommend annual imaging
followup by CTA or MRA. This recommendation follows 7777
ACCF/AHA/AATS/ACR/ASA/SCA/LOU/JIM/ACHAREYA/ASHTON Guidelines for the
Diagnosis and Management of Patients with Thoracic Aortic Disease.
Circulation. 7777; 121: [id number]
3. Other imaging findings of potential clinical significance: Severe
emphysema. Airway thickening is present, suggesting bronchitis or
reactive airways disease. Hepatic steatosis.

## 2018-04-28 ENCOUNTER — Ambulatory Visit
Admission: RE | Admit: 2018-04-28 | Discharge: 2018-04-28 | Disposition: A | Discharge status: Home / Self Care | Payer: Medicare Other | Source: Ambulatory Visit | Attending: Urology | Admitting: Urology

## 2018-05-03 ENCOUNTER — Other Ambulatory Visit: Payer: Self-pay | Admitting: *Deleted

## 2018-05-03 ENCOUNTER — Other Ambulatory Visit: Payer: Self-pay | Admitting: Oncology

## 2018-05-03 DIAGNOSIS — C649 Malignant neoplasm of unspecified kidney, except renal pelvis: Secondary | ICD-10-CM

## 2018-05-03 MED ORDER — RIVAROXABAN 20 MG PO TABS: 20.0000 mg | ORAL_TABLET | Freq: Every day | ORAL | 3 refills | Status: DC

## 2018-05-04 ENCOUNTER — Other Ambulatory Visit: Payer: Self-pay | Admitting: *Deleted

## 2018-05-04 DIAGNOSIS — C649 Malignant neoplasm of unspecified kidney, except renal pelvis: Secondary | ICD-10-CM

## 2018-05-04 MED ORDER — CABOZANTINIB S-MALATE 60 MG PO TABS
60.0000 mg | ORAL_TABLET | Freq: Every day | ORAL | 0 refills | Status: DC
Start: 2018-05-04 — End: 2018-06-08

## 2018-05-04 MED ORDER — CABOZANTINIB S-MALATE 60 MG PO TABS: 60.0000 mg | ORAL_TABLET | Freq: Every day | ORAL | 0 refills | Status: DC

## 2018-05-19 ENCOUNTER — Inpatient Hospital Stay: Payer: Medicare Other

## 2018-05-19 ENCOUNTER — Inpatient Hospital Stay: Payer: Medicare Other | Attending: Oncology | Admitting: Oncology

## 2018-05-19 ENCOUNTER — Telehealth: Payer: Self-pay | Admitting: Oncology

## 2018-05-19 VITALS — BP 140/91 | HR 70 | Temp 97.8°F | Resp 18 | Ht 75.0 in | Wt 190.1 lb

## 2018-05-19 DIAGNOSIS — Z7901 Long term (current) use of anticoagulants: Secondary | ICD-10-CM | POA: Diagnosis not present

## 2018-05-19 DIAGNOSIS — F419 Anxiety disorder, unspecified: Secondary | ICD-10-CM | POA: Diagnosis not present

## 2018-05-19 DIAGNOSIS — Z923 Personal history of irradiation: Secondary | ICD-10-CM

## 2018-05-19 DIAGNOSIS — R600 Localized edema: Secondary | ICD-10-CM | POA: Diagnosis not present

## 2018-05-19 DIAGNOSIS — C7801 Secondary malignant neoplasm of right lung: Secondary | ICD-10-CM | POA: Insufficient documentation

## 2018-05-19 DIAGNOSIS — C7951 Secondary malignant neoplasm of bone: Secondary | ICD-10-CM | POA: Insufficient documentation

## 2018-05-19 DIAGNOSIS — C649 Malignant neoplasm of unspecified kidney, except renal pelvis: Secondary | ICD-10-CM | POA: Diagnosis not present

## 2018-05-19 DIAGNOSIS — I2699 Other pulmonary embolism without acute cor pulmonale: Secondary | ICD-10-CM

## 2018-05-19 LAB — CBC WITH DIFFERENTIAL (CANCER CENTER ONLY)
Basophils Absolute: 0 10*3/uL (ref 0.0–0.1)
Basophils Relative: 0 %
Eosinophils Absolute: 0.2 10*3/uL (ref 0.0–0.5)
Eosinophils Relative: 3 %
HCT: 34.7 % — ABNORMAL LOW (ref 38.4–49.9)
Hemoglobin: 10.4 g/dL — ABNORMAL LOW (ref 13.0–17.1)
Lymphocytes Relative: 26 %
Lymphs Abs: 1.2 10*3/uL (ref 0.9–3.3)
MCH: 30.4 pg (ref 27.2–33.4)
MCHC: 30 g/dL — ABNORMAL LOW (ref 32.0–36.0)
MCV: 101.5 fL — ABNORMAL HIGH (ref 79.3–98.0)
Monocytes Absolute: 0.2 10*3/uL (ref 0.1–0.9)
Monocytes Relative: 5 %
Neutro Abs: 2.9 10*3/uL (ref 1.5–6.5)
Neutrophils Relative %: 66 %
Platelet Count: 137 10*3/uL — ABNORMAL LOW (ref 140–400)
RBC: 3.42 MIL/uL — ABNORMAL LOW (ref 4.20–5.82)
RDW: 17.4 % — ABNORMAL HIGH (ref 11.0–14.6)
WBC Count: 4.5 10*3/uL (ref 4.0–10.3)

## 2018-05-19 LAB — CMP (CANCER CENTER ONLY)
ALT: 17 U/L (ref 0–55)
AST: 20 U/L (ref 5–34)
Albumin: 2.7 g/dL — ABNORMAL LOW (ref 3.5–5.0)
Alkaline Phosphatase: 85 U/L (ref 40–150)
Anion gap: 7 (ref 3–11)
BUN: 9 mg/dL (ref 7–26)
CO2: 28 mmol/L (ref 22–29)
Calcium: 8.3 mg/dL — ABNORMAL LOW (ref 8.4–10.4)
Chloride: 106 mmol/L (ref 98–109)
Creatinine: 0.98 mg/dL (ref 0.70–1.30)
GFR, Est AFR Am: >60 mL/min (ref >60)
GFR, Estimated: >60 mL/min (ref >60)
Glucose, Bld: 105 mg/dL (ref 70–140)
Potassium: 4.3 mmol/L (ref 3.5–5.1)
Sodium: 141 mmol/L (ref 136–145)
Total Bilirubin: <0.2 mg/dL — ABNORMAL LOW (ref 0.2–1.2)
Total Protein: 5.8 g/dL — ABNORMAL LOW (ref 6.4–8.3)

## 2018-05-19 MED ORDER — FUROSEMIDE 20 MG PO TABS: 20.0000 mg | ORAL_TABLET | Freq: Every day | ORAL | 0 refills | Status: DC | PRN

## 2018-05-19 NOTE — Progress Notes (Signed)
Hematology and Oncology Follow Up Visit  Keith [name] [id number] Mar 08, 1966 52 y.o. 05/19/2018 3:10 PM  Keith Bring, MD  Keith [name], M.D.  Keith Bent, MD  Keith Banister, MD    Principle Diagnosis: 52 year old man with stage IV renal cell carcinoma diagnosed in 2009.  He has documented disease to the bone and the lung.   Prior Therapy: 1. Status post laparoscopic radical nephrectomy.  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  2. Patient status post thoracotomy for a synchronous metastatic lung lesions done October 2009.   3. Patient is status post stereotactic radiotherapy to pulmonary nodules in May of 2010. 4. He is S/P Sutent 50 mg 4 weeks on 2 weeks off from 10/2010 to 03/2013. He progressed at that time.  5. He is S/P radiation to the right sacral bone between 4/22 to 4/30.  6. He is S/P XRT to the left shoulder 03/20/14 to 03/31/14. 7. Votrient 800 mg by mouth daily from 03/2013 through 06/22/2015. Discontinued secondary to disease progression. 8. Nivolumab 3 mg/kg given every 2 weeks started on 06/29/2015. He is status post 4 cycles completed 08/10/2015. He developed disease progression in September 2016.  9. Status post radiation therapy to the left mid fibula completed on 11/14/2015. He received a grade 1 fraction. 10. He is status post radiation to the proximal and distal femur over 2 weeks and 10 fractions of total of 30 Gy.  This was completed in March 2019.   Current therapy:  Cabometyx 60 mg daily started in November 2016.   Interim History:  [name] returns today for a follow-up visit.  Since last visit, he has felt reasonably well overall without any major changes in his health.  His pain is reasonably controlled and his appetite have increased and have gained weight.  He did notice an increase in his lower extremity edema bilaterally.  He reports ankle edema that is responsive to feet elevation and resolves after that.  He  denies any abdominal pain or difficulty with urination.  He denies any shortness of breath or dyspnea on exertion.  He remains on Cabometyx 60 mg without any major complications.  He denies any excessive diarrhea or hand-foot syndrome.  He denies any excessive fatigue or tiredness.  His performance status remain unchanged.   He did not report any headaches blurred vision or double vision.  He denies any syncope or seizures.  He denies any fevers or chills or sweats.  He does not report any chest pain, palpitation, orthopnea.  He does not report any cough, wheezing or hemoptysis.  Does not report any nausea, vomiting or abdominal pain.  Does not report any hematuria. He does not report any rashes or lesions or petechiae. He does not report any lymphadenopathy.  He does report occasional anxiety.  He does not report any heat or cold intolerance.  Remainder of his review of system is negative.   Medications: I have reviewed the patient's current medications.  Current Outpatient Medications  Medication Sig Dispense Refill  . cabozantinib (CABOMETYX) 60 MG tablet Take 1 tablet (60 mg total) by mouth daily. With at least 8 ounces of water on an empty stomach. Do not eat for 2 hours before or 1 hour after. Swallow whole. 30 tablet 0  . calcium carbonate (TUMS - DOSED IN MG ELEMENTAL CALCIUM) 500 MG chewable tablet Chew 1 tablet by mouth as needed for indigestion or heartburn.    . diazepam (VALIUM) 5  MG tablet TAKE 1 TABLET BY MOUTH EVERY 6 HOURS AS NEEDED FOR ANXIETY 90 tablet 0  . diphenoxylate-atropine (LOMOTIL) 2.5-0.025 MG tablet Take 1 tablet by mouth 4 (four) times daily as needed for diarrhea or loose stools. 60 tablet 0  . DULoxetine (CYMBALTA) 60 MG capsule Take 1 capsule (60 mg total) by mouth 2 (two) times daily. 180 capsule 0  . ferrous sulfate 325 (65 FE) MG tablet Take 1 tablet (325 mg total) by mouth 3 (three) times daily after meals.  3  . furosemide (LASIX) 20 MG tablet Take 1 tablet (20  mg total) by mouth daily as needed. 30 tablet 0  . gabapentin (NEURONTIN) 300 MG capsule Take 1 capsule (300 mg total) by mouth 3 (three) times daily. 270 capsule 0  . HYDROmorphone (DILAUDID) 8 MG tablet Take 1 tablet (8 mg total) by mouth every 4 (four) hours as needed for severe pain. (Patient not taking: Reported on 03/03/2018) 30 tablet 0  . rivaroxaban (XARELTO) 20 MG TABS tablet Take 1 tablet (20 mg total) by mouth daily with supper. 90 tablet 3  . tiZANidine (ZANAFLEX) 4 MG tablet Take 1 tablet (4 mg total) by mouth every 8 (eight) hours as needed for muscle spasms. 30 tablet 0   No current facility-administered medications for this visit.      Allergies:  Allergies  Allergen Reactions  . Ceftriaxone Hives  . Hydrocodone Swelling    Past Medical History, Surgical history, Social history, and Family History were reviewed and updated.   Physical Exam: Blood pressure (!) 140/91, pulse 70, temperature 97.8 F (36.6 C), temperature source Oral, resp. rate 18, height 6\' 3"  (1.905 m), weight 190 lb 1.6 oz (86.2 kg), SpO2 99 %. ECOG: 1 General appearance: Well-appearing gentleman without distress. Head: Atraumatic without abnormalities. Oral mucosa: No oral thrush or ulcers. Eyes: Pupils are equal and round reactive to light. Lymph nodes: No lymphadenopathy noted in the cervical, supraclavicular, or axillary nodes.  Heart: Regular rate and rhythm without any murmurs or gallops.  Bilateral ankle edema 1+ noted. Lung: Clear without any rhonchi, wheezes or dullness to percussion. Abdomen: No rebound or guarding.  Soft without any tenderness. Skin: No ecchymosis or petechiae. Musculoskeletal: No erythema or induration noted. Neuro: No motor or sensory deficits.  Lab Results: Lab Results  Component Value Date   WBC 4.5 05/19/2018   HGB 10.4 (L) 05/19/2018   HCT 34.7 (L) 05/19/2018   MCV 101.5 (H) 05/19/2018   PLT 137 (L) 05/19/2018    Impression and Plan:  52 year old man  with  1.  Stage IV renal cell carcinoma diagnosed in 2009 with clear cell histology and disease in the lung and the bone.  He remains on Cabometyx 60 mg without any major complications.  He denies any new side effects that warrants dose reduction or delay.  Risks and benefits of continuing this medication was reviewed today and the plan is to continue as scheduled and repeat imaging studies before the next visit in 07-15-18.  2.  Right femur metastasis: He is status post fixation followed by adjuvant radiation therapy.  No worsening pain noted at this time.  3.  Pulmonary embolism: He is on Xarelto without any recent thrombosis or bleeding.  Plan is to continue this therapy indefinitely.  4.  Anxiety: Reasonably controlled with Valium.  5.  Diarrhea: No worsening diarrhea noted since last visit.  He is clear instructions how to handle this issue if it arises.  6.  Lower  extremity edema: Unclear etiology.  This could be related to lymphatic obstruction from malignancy.  Deep vein thrombosis considered unlikely as he is adherent to Xarelto.  I will prescribe him Lasix to use as needed.  7.  Prognosis: Treatment remains palliative at this time.  His performance status remains adequate and aggressive therapy is warranted.  8.  Follow-up: We will be in July 2019 for his next evaluation.  15  minutes was spent with the patient face-to-face today.  More than 50% of time was dedicated to patient counseling, education and urinating his future plan of care.Zola Button, MD 05/19/18

## 2018-05-19 NOTE — Telephone Encounter (Signed)
Scheduled appt per 5/29 los - Gave patient aVS and calender per los.

## 2018-05-26 DIAGNOSIS — G620 Drug-induced polyneuropathy: Secondary | ICD-10-CM | POA: Diagnosis not present

## 2018-05-26 DIAGNOSIS — C642 Malignant neoplasm of left kidney, except renal pelvis: Secondary | ICD-10-CM | POA: Diagnosis not present

## 2018-05-26 DIAGNOSIS — Z9689 Presence of other specified functional implants: Secondary | ICD-10-CM | POA: Diagnosis not present

## 2018-06-02 ENCOUNTER — Other Ambulatory Visit: Payer: Self-pay | Admitting: Oncology

## 2018-06-02 DIAGNOSIS — C649 Malignant neoplasm of unspecified kidney, except renal pelvis: Secondary | ICD-10-CM

## 2018-06-03 ENCOUNTER — Telehealth: Payer: Self-pay | Admitting: *Deleted

## 2018-06-03 ENCOUNTER — Other Ambulatory Visit: Payer: Self-pay | Admitting: *Deleted

## 2018-06-03 DIAGNOSIS — C649 Malignant neoplasm of unspecified kidney, except renal pelvis: Secondary | ICD-10-CM

## 2018-06-03 MED ORDER — DIAZEPAM 5 MG PO TABS: 5.0000 mg | ORAL_TABLET | Freq: Four times a day (QID) | ORAL | 0 refills | Status: DC | PRN

## 2018-06-03 NOTE — Telephone Encounter (Signed)
Tried calling patient to let him know his prescription has been called into [facility]; however, his voicemail is not set up to receive messages.

## 2018-06-08 ENCOUNTER — Other Ambulatory Visit: Payer: Self-pay | Admitting: *Deleted

## 2018-06-08 DIAGNOSIS — C649 Malignant neoplasm of unspecified kidney, except renal pelvis: Secondary | ICD-10-CM

## 2018-06-08 MED ORDER — CABOZANTINIB S-MALATE 60 MG PO TABS: 60.0000 mg | ORAL_TABLET | Freq: Every day | ORAL | 0 refills | Status: DC

## 2018-06-11 ENCOUNTER — Other Ambulatory Visit: Payer: Self-pay | Admitting: Oncology

## 2018-06-15 ENCOUNTER — Other Ambulatory Visit: Payer: Self-pay | Admitting: Oncology

## 2018-06-20 ENCOUNTER — Other Ambulatory Visit: Payer: Self-pay | Admitting: Oncology

## 2018-07-01 ENCOUNTER — Ambulatory Visit: Payer: Medicare Other

## 2018-07-01 ENCOUNTER — Telehealth: Payer: Self-pay

## 2018-07-01 DIAGNOSIS — C649 Malignant neoplasm of unspecified kidney, except renal pelvis: Secondary | ICD-10-CM

## 2018-07-01 NOTE — Telephone Encounter (Signed)
Attempt to call pt. No VM set up. Pt able to schedule CT scn for 7/17. Plan to keep lab appt for tomorrow confirmed. Will attempt to call pt again to let him know that appt with Dr. [name] changed to 7/24 at 1200.

## 2018-07-02 ENCOUNTER — Ambulatory Visit (HOSPITAL_COMMUNITY): Payer: Medicare Other

## 2018-07-02 ENCOUNTER — Telehealth: Payer: Self-pay

## 2018-07-02 ENCOUNTER — Inpatient Hospital Stay: Payer: Medicare Other

## 2018-07-02 ENCOUNTER — Other Ambulatory Visit: Payer: Self-pay | Admitting: Oncology

## 2018-07-02 ENCOUNTER — Other Ambulatory Visit: Payer: Self-pay

## 2018-07-02 DIAGNOSIS — C649 Malignant neoplasm of unspecified kidney, except renal pelvis: Secondary | ICD-10-CM

## 2018-07-02 NOTE — Telephone Encounter (Signed)
Attempted call to pt 7/11 at 1014 to notify of change in MD appt date/time. Will call back tomorrow.

## 2018-07-02 NOTE — Telephone Encounter (Signed)
Communicated changed MD appt with pt for 7/24/19at 1200 with Dr. [name]. Pt verbalized understanding. Additionally, pt appt changed for labs this AM to Tuesday (7/16) at [place]. Pt verbalized understanding of dates and times. Orders for CBC and CMP extended to 7/16.

## 2018-07-05 ENCOUNTER — Other Ambulatory Visit: Payer: Self-pay | Admitting: *Deleted

## 2018-07-05 DIAGNOSIS — C649 Malignant neoplasm of unspecified kidney, except renal pelvis: Secondary | ICD-10-CM

## 2018-07-05 DIAGNOSIS — C7800 Secondary malignant neoplasm of unspecified lung: Secondary | ICD-10-CM

## 2018-07-05 DIAGNOSIS — C7951 Secondary malignant neoplasm of bone: Secondary | ICD-10-CM

## 2018-07-05 MED ORDER — DIAZEPAM 5 MG PO TABS: 5.0000 mg | ORAL_TABLET | Freq: Four times a day (QID) | ORAL | 0 refills | Status: DC | PRN

## 2018-07-06 ENCOUNTER — Inpatient Hospital Stay: Payer: Medicare Other | Attending: Oncology

## 2018-07-06 ENCOUNTER — Inpatient Hospital Stay: Payer: Medicare Other | Admitting: Oncology

## 2018-07-06 DIAGNOSIS — C7889 Secondary malignant neoplasm of other digestive organs: Secondary | ICD-10-CM | POA: Insufficient documentation

## 2018-07-06 DIAGNOSIS — C7800 Secondary malignant neoplasm of unspecified lung: Secondary | ICD-10-CM | POA: Diagnosis not present

## 2018-07-06 DIAGNOSIS — I2699 Other pulmonary embolism without acute cor pulmonale: Secondary | ICD-10-CM | POA: Diagnosis not present

## 2018-07-06 DIAGNOSIS — F419 Anxiety disorder, unspecified: Secondary | ICD-10-CM | POA: Insufficient documentation

## 2018-07-06 DIAGNOSIS — C7951 Secondary malignant neoplasm of bone: Secondary | ICD-10-CM | POA: Diagnosis not present

## 2018-07-06 DIAGNOSIS — M79604 Pain in right leg: Secondary | ICD-10-CM | POA: Insufficient documentation

## 2018-07-06 DIAGNOSIS — C649 Malignant neoplasm of unspecified kidney, except renal pelvis: Secondary | ICD-10-CM | POA: Diagnosis not present

## 2018-07-06 LAB — CMP (CANCER CENTER ONLY)
ALT: 20 U/L (ref 0–44)
AST: 21 U/L (ref 15–41)
Albumin: 3.1 g/dL — ABNORMAL LOW (ref 3.5–5.0)
Alkaline Phosphatase: 94 U/L (ref 38–126)
Anion gap: 8 (ref 5–15)
BUN: 12 mg/dL (ref 6–20)
CO2: 28 mmol/L (ref 22–32)
Calcium: 8.5 mg/dL — ABNORMAL LOW (ref 8.9–10.3)
Chloride: 103 mmol/L (ref 98–111)
Creatinine: 1.01 mg/dL (ref 0.61–1.24)
GFR, Est AFR Am: >60 mL/min (ref >60)
GFR, Estimated: >60 mL/min (ref >60)
Glucose, Bld: 125 mg/dL — ABNORMAL HIGH (ref 70–99)
Potassium: 4.2 mmol/L (ref 3.5–5.1)
Sodium: 139 mmol/L (ref 135–145)
Total Bilirubin: 0.2 mg/dL — ABNORMAL LOW (ref 0.3–1.2)
Total Protein: 6.7 g/dL (ref 6.5–8.1)

## 2018-07-06 LAB — CBC WITH DIFFERENTIAL (CANCER CENTER ONLY)
Basophils Absolute: 0 10*3/uL (ref 0.0–0.1)
Basophils Relative: 1 %
Eosinophils Absolute: 0.1 10*3/uL (ref 0.0–0.5)
Eosinophils Relative: 3 %
HCT: 35.8 % — ABNORMAL LOW (ref 38.4–49.9)
Hemoglobin: 11.6 g/dL — ABNORMAL LOW (ref 13.0–17.1)
Lymphocytes Relative: 22 %
Lymphs Abs: 1 10*3/uL (ref 0.9–3.3)
MCH: 31.1 pg (ref 27.2–33.4)
MCHC: 32.5 g/dL (ref 32.0–36.0)
MCV: 95.7 fL (ref 79.3–98.0)
Monocytes Absolute: 0.3 10*3/uL (ref 0.1–0.9)
Monocytes Relative: 7 %
Neutro Abs: 3.1 10*3/uL (ref 1.5–6.5)
Neutrophils Relative %: 67 %
Platelet Count: 150 10*3/uL (ref 140–400)
RBC: 3.75 MIL/uL — ABNORMAL LOW (ref 4.20–5.82)
RDW: 19.3 % — ABNORMAL HIGH (ref 11.0–14.6)
WBC Count: 4.6 10*3/uL (ref 4.0–10.3)

## 2018-07-07 ENCOUNTER — Ambulatory Visit (HOSPITAL_COMMUNITY)
Admission: RE | Admit: 2018-07-07 | Discharge: 2018-07-07 | Disposition: A | Discharge status: Home / Self Care | Payer: Medicare Other | Source: Ambulatory Visit | Attending: Oncology | Admitting: Oncology

## 2018-07-07 ENCOUNTER — Other Ambulatory Visit: Payer: Self-pay | Admitting: *Deleted

## 2018-07-07 ENCOUNTER — Ambulatory Visit (HOSPITAL_COMMUNITY): Admission: RE | Admit: 2018-07-07 | Payer: Medicare Other | Source: Ambulatory Visit

## 2018-07-07 DIAGNOSIS — K769 Liver disease, unspecified: Secondary | ICD-10-CM | POA: Insufficient documentation

## 2018-07-07 DIAGNOSIS — C7800 Secondary malignant neoplasm of unspecified lung: Secondary | ICD-10-CM | POA: Diagnosis not present

## 2018-07-07 DIAGNOSIS — C649 Malignant neoplasm of unspecified kidney, except renal pelvis: Secondary | ICD-10-CM

## 2018-07-07 DIAGNOSIS — C7951 Secondary malignant neoplasm of bone: Secondary | ICD-10-CM | POA: Insufficient documentation

## 2018-07-07 DIAGNOSIS — C7989 Secondary malignant neoplasm of other specified sites: Secondary | ICD-10-CM | POA: Diagnosis not present

## 2018-07-07 DIAGNOSIS — C7889 Secondary malignant neoplasm of other digestive organs: Secondary | ICD-10-CM | POA: Diagnosis not present

## 2018-07-07 MED ORDER — IOPAMIDOL (ISOVUE-300) INJECTION 61%
INTRAVENOUS | Status: AC
  Filled 2018-07-07: qty 100

## 2018-07-07 MED ORDER — IOPAMIDOL (ISOVUE-300) INJECTION 61%
100.0000 mL | Freq: Once | INTRAVENOUS | Status: AC | PRN
  Administered 2018-07-07: 100 mL via INTRAVENOUS

## 2018-07-07 MED ORDER — CABOZANTINIB S-MALATE 60 MG PO TABS: 60.0000 mg | ORAL_TABLET | Freq: Every day | ORAL | 0 refills | Status: DC

## 2018-07-14 ENCOUNTER — Inpatient Hospital Stay (HOSPITAL_BASED_OUTPATIENT_CLINIC_OR_DEPARTMENT_OTHER): Payer: Medicare Other | Admitting: Oncology

## 2018-07-14 VITALS — BP 136/90 | HR 77 | Temp 97.8°F | Resp 18 | Ht 75.0 in | Wt 185.5 lb

## 2018-07-14 DIAGNOSIS — M7989 Other specified soft tissue disorders: Secondary | ICD-10-CM | POA: Diagnosis not present

## 2018-07-14 DIAGNOSIS — C7889 Secondary malignant neoplasm of other digestive organs: Secondary | ICD-10-CM | POA: Diagnosis not present

## 2018-07-14 DIAGNOSIS — C649 Malignant neoplasm of unspecified kidney, except renal pelvis: Secondary | ICD-10-CM

## 2018-07-14 DIAGNOSIS — C7800 Secondary malignant neoplasm of unspecified lung: Secondary | ICD-10-CM | POA: Diagnosis not present

## 2018-07-14 DIAGNOSIS — I2699 Other pulmonary embolism without acute cor pulmonale: Secondary | ICD-10-CM

## 2018-07-14 DIAGNOSIS — C7951 Secondary malignant neoplasm of bone: Secondary | ICD-10-CM | POA: Diagnosis not present

## 2018-07-14 DIAGNOSIS — F419 Anxiety disorder, unspecified: Secondary | ICD-10-CM

## 2018-07-14 DIAGNOSIS — M79604 Pain in right leg: Secondary | ICD-10-CM

## 2018-07-14 NOTE — Progress Notes (Signed)
Hematology and Oncology Follow Up Visit  Keith [name] [id number] Apr 11, 1966 52 y.o. 07/14/2018 12:13 PM  [name], MD  [name], M.D.  [name], MD  [name], MD    Principle Diagnosis: 52 year old man with renal cell carcinoma diagnosed in 2009 with lung involvement indicating stage IV disease.  He has disease involvement in the bone as well as the pancreas.  Prior Therapy: 1. Status post laparoscopic radical nephrectomy.  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  2. Patient status post thoracotomy for a synchronous metastatic lung lesions done October 2009.   3. Patient is status post stereotactic radiotherapy to pulmonary nodules in May of 2010. 4. He is S/P Sutent 50 mg 4 weeks on 2 weeks off from 10/2010 to 03/2013. He progressed at that time.  5. He is S/P radiation to the right sacral bone between 4/22 to 4/30.  6. He is S/P XRT to the left shoulder 03/20/14 to 03/31/14. 7. Votrient 800 mg by mouth daily from 03/2013 through 06/22/2015. Discontinued secondary to disease progression. 8. Nivolumab 3 mg/kg given every 2 weeks started on 06/29/2015. He is status post 4 cycles completed 08/10/2015. He developed disease progression in September 2016.  9. Status post radiation therapy to the left mid fibula completed on 11/14/2015. He received a grade 1 fraction. 10. He is status post radiation to the proximal and distal femur over 2 weeks and 10 fractions of total of 30 Gy.  This was completed in March 2019.   Current therapy:  Cabometyx 60 mg daily started in November 2016.   Interim History:  Keith [name] is here for a follow-up visit.  Since her last visit, he has reported increased lower extremity pain on the right side.  His pain is noted in the distal part of his right leg and ankle.  He also reported some soft tissue swelling and skin indentation and is noted.  No erythema or induration noted.  No systemic fever or other  complaints.  He was able to bear weight although ambulating gingerly.  He denies any trauma or falls.  He continues to attend to activities of daily living without any major decline.  He has not reported any complications related to Cabometyx 60 mg and continues to take it regularly.  He denied worsening diarrhea, excessive fatigue or tiredness.  He continues to enjoy reasonable quality of life at this time.  His pain is manageable with Dilaudid prescribed by pain management clinic.  He did not report any headaches blurred vision or double vision.  He denies any confusion or alteration in mental status.  He denies any fevers or chills or sweats.  Appetite and weight is not dramatically changed.  He does not report any chest pain, palpitation, orthopnea.  He does not report any cough, wheezing or hemoptysis.  Does not report any nausea, vomiting or abdominal pain.  He denies any abdominal distention or change in his bowel habits.  He denies any hematochezia or melena.  He denies any frequency urgency or hematuria. He does not report any rashes or lesions or petechiae. He does not report any lymphadenopathy or easy bruising.  He does report anxiety occasional depression.  He does not report any heat or cold intolerance.  Remainder of his review of system is negative.   Medications: I have reviewed the patient's current medications.  Current Outpatient Medications  Medication Sig Dispense Refill  . cabozantinib (CABOMETYX) 60 MG tablet Take 1  tablet (60 mg total) by mouth daily. With at least 8 ounces of water on an empty stomach. Do not eat for 2 hours before or 1 hour after. Swallow whole. 30 tablet 0  . calcium carbonate (TUMS - DOSED IN MG ELEMENTAL CALCIUM) 500 MG chewable tablet Chew 1 tablet by mouth as needed for indigestion or heartburn.    . diazepam (VALIUM) 5 MG tablet Take 1 tablet (5 mg total) by mouth every 6 (six) hours as needed. for anxiety 90 tablet 0  . diphenoxylate-atropine (LOMOTIL)  2.5-0.025 MG tablet Take 1 tablet by mouth 4 (four) times daily as needed for diarrhea or loose stools. 60 tablet 0  . DULoxetine (CYMBALTA) 60 MG capsule TAKE 1 CAPSULE (60 MG TOTAL) BY MOUTH 2 (TWO) TIMES DAILY. 180 capsule 0  . ferrous sulfate 325 (65 FE) MG tablet Take 1 tablet (325 mg total) by mouth 3 (three) times daily after meals.  3  . furosemide (LASIX) 20 MG tablet TAKE 1 TABLET BY MOUTH EVERY DAY AS NEEDED 30 tablet 0  . gabapentin (NEURONTIN) 300 MG capsule TAKE 1 CAPSULE BY MOUTH THREE TIMES A DAY 270 capsule 0  . HYDROmorphone (DILAUDID) 8 MG tablet Take 1 tablet (8 mg total) by mouth every 4 (four) hours as needed for severe pain. (Patient not taking: Reported on 03/03/2018) 30 tablet 0  . rivaroxaban (XARELTO) 20 MG TABS tablet Take 1 tablet (20 mg total) by mouth daily with supper. 90 tablet 3  . tiZANidine (ZANAFLEX) 4 MG tablet Take 1 tablet (4 mg total) by mouth every 8 (eight) hours as needed for muscle spasms. 30 tablet 0   No current facility-administered medications for this visit.      Allergies:  Allergies  Allergen Reactions  . Ceftriaxone Hives  . Hydrocodone Swelling    Past Medical History, Surgical history, Social history, and Family History were reviewed and updated.   Physical Exam: Blood pressure 136/90, pulse 77, temperature 97.8 F (36.6 C), temperature source Oral, resp. rate 18, height 6\' 3"  (1.905 m), weight 185 lb 8 oz (84.1 kg), SpO2 100 %.   ECOG: 1 General appearance: Alert, awake man without distress. Head: Normocephalic without abnormalities. Oral mucosa: No oral ulcers or thrush. Eyes: Sclera anicteric. Lymph nodes: No  cervical, supraclavicular, or axillary lymphadenopathy. Heart: Regular rate without any murmurs or gallops.  S1 and S2. Lung: Clear without any rhonchi, wheezes or dullness to percussion. Abdomen: Soft, nontender without any rebound or guarding.  Good bowel sounds. Skin: No rashes or lesions.  Musculoskeletal: Left  extremity soft tissue swelling was noted in the distal right leg and ankle.  No erythema noted but limited range of motion in his right ankle. Neuro: No deficits noted motor sensory exam.  Lab Results: Lab Results  Component Value Date   WBC 4.6 07/06/2018   HGB 11.6 (L) 07/06/2018   HCT 35.8 (L) 07/06/2018   MCV 95.7 07/06/2018   PLT 150 07/06/2018    EXAM: CT CHEST WITH CONTRAST  CT ABDOMEN AND PELVIS WITH AND WITHOUT CONTRAST  TECHNIQUE: Multidetector CT imaging of the chest was performed during intravenous contrast administration. Multidetector CT imaging of the abdomen and pelvis was performed following the standard protocol before and during bolus administration of intravenous contrast.  CONTRAST:  1106mL ISOVUE-300 IOPAMIDOL (ISOVUE-300) INJECTION 61%  COMPARISON:  01/20/2018  FINDINGS: CT CHEST FINDINGS  Cardiovascular: The heart size appears normal. No pericardial effusion.  Mediastinum/Nodes: Normal appearance of the thyroid gland. The trachea appears  patent and is midline. Normal appearance of the esophagus. No enlarged mediastinal lymph nodes. No axillary or supraclavicular adenopathy. Left hilar lymph node measures 1.3 cm, image 47/9. Unchanged from previous exam.  Lungs/Pleura: No pleural effusion. Moderate changes of emphysema. The right upper lobe perihilar lung lesion measures 3.5 x 1.3 cm, image 43/13. Unchanged from previous exam. Right upper lobe perihilar lesion measures 2 cm, image 54/9. Unchanged from previous exam. Status post right lower lobe wedge resection. Within the posterior right upper lobe there is a subpleural nodule which extends into the pleura measuring 2.0 x 1.3 cm, image 25/9. Previously this measured the same.  Musculoskeletal: Dorsal stimulator. The expansile lesion involving the posterior right fourth rib is unchanged, image 22/9. No new bone lesions identified.  CT ABDOMEN AND PELVIS FINDINGS  Hepatobiliary:  There is a arterial phase enhancing structure within the posterior aspect of segment 8 measuring 1 cm, image 35/5. Not clearly seen on study from 01/20/2018 which may reflect differences and phases of contrast opacification. On 07/28/2017 this was present measuring 0.8 cm. No additional enhancing liver abnormalities identified. The gallbladder appears normal. No biliary dilatation.  Pancreas: Mass arising from neck of pancreas measures 3.0 x 2.6 cm, image 49/5. Previously 2.8 x 2.6 cm. Enhancing lesion nodule within the main duct at the level of the body measures 1.4 by 2.0 cm, image 40/5. Previously 1.6 by 1.5 cm. More distally there is a lesion involving the proximal tail measuring 1.2 by 0.6 cm, image 36/5. Previously 1.3 by 0.6 cm.  Spleen: Spleen is normal.  Adrenals/Urinary Tract: Normal appearance of the adrenal glands. The right kidney is unremarkable. Status post left nephrectomy.  Stomach/Bowel: Normal appearance of the stomach. The small bowel loops have a normal course and caliber without evidence for obstruction. Unremarkable appearance of the colon.  Vascular/Lymphatic: Aortic atherosclerosis. No aneurysm. No retroperitoneal or mesenteric adenopathy. No pelvic adenopathy.  Reproductive: Prostate is unremarkable.  Other: No ascites or focal fluid collections identified.  Musculoskeletal: Right inferior pubic ramus lytic lesion is again identified, image 213/9. Stable from previous exam. Proximal femur lytic lesion involving the proximal right femur is again noted, image 221/9. Patient has undergone internal hardware fixation of the proximal right femur ill define right sacral lesion measures 2.9 cm, image 172/9. Unchanged from previous exam. With IM nail and hip screw.  IMPRESSION: 1. Stable appearance pulmonary metastasis. No new or progressive disease identified within the chest. 2. Pancreatic metastasis are not significantly changed in  the interval. 3. Arterial phase enhancing lesion within the posterior aspect segment 8 of the liver measures 10 mm. This was likely present on study from 07/28/2017 when it measured 8 mm. 4. Stable right inferior pubic rami metastasis and right sacral metastasis. The patient has undergone open reduction and internal fixation of the proximal right femur where previous lytic metastasis noted. Stable appearance right posterior rib metastasis.    Impression and Plan:  52 year old man with  1.  Renal cell carcinoma diagnosed in 2009.  He presented with stage IV disease and pulmonary metastasis.  He is status post multiple therapies outlined above and currently has documented disease to the pancreas and bone.  He continues to take Cabometyx 60 mg and has tolerated this medication reasonably well.  CT scan obtained on 07/07/2018 was personally reviewed and showed overall stable systemic disease.  Risks and benefits of continuing this medication was discussed and is agreeable to continue.  We will plan to repeat imaging studies in 4 to 6 months.  2.  Right femur metastasis: No evidence of recurrence after fixation followed by adjuvant radiation therapy.  Pain is manageable.  3.  Pulmonary embolism: No recent recurrences or bleeding episodes from Xarelto.  I recommended continuing this indefinitely.  4.  Right leg pain: He has reported increased pain in his distal right leg close to the ankle.  He does have soft tissue swelling associated with it.  He is at risk of developing metastatic disease to that area and imaging studies are required.  We will obtain MRI of the distal tibia and fibula as well as right ankle imaging to rule out disease involvement.  Radiation therapy and possibly orthopedic intervention may be needed.  5.  Diarrhea: No change in his bowel habits at this time.  No diarrhea reported.  6.  Anxiety: Appears to be manageable at this time with appropriate mood and affect.  7.   Prognosis: He has incurable malignancy and any treatment is palliative.  Continues to desire aggressive therapy which is appropriate given his excellent performance status.  8.  Follow-up: We will be in September 2019 for a follow-up.  25  minutes was spent with the patient face-to-face today.  More than 50% of time was dedicated to patient counseling, education and discussing imaging studies and future plan of care.     Zola Button, MD 07/14/18

## 2018-07-15 ENCOUNTER — Telehealth: Payer: Self-pay | Admitting: Oncology

## 2018-07-15 NOTE — Telephone Encounter (Signed)
Spoke with patient regarding appointments added and mailed calendar also per 7/24 los

## 2018-07-15 NOTE — Telephone Encounter (Signed)
Called pateint regarding voice mail.  Patient wanted phone number to call regarding his MRI.  Gave number to patient.

## 2018-07-20 ENCOUNTER — Other Ambulatory Visit: Payer: Self-pay | Admitting: Oncology

## 2018-07-20 DIAGNOSIS — C649 Malignant neoplasm of unspecified kidney, except renal pelvis: Secondary | ICD-10-CM

## 2018-07-20 DIAGNOSIS — M79604 Pain in right leg: Secondary | ICD-10-CM

## 2018-07-29 ENCOUNTER — Ambulatory Visit (HOSPITAL_COMMUNITY): Payer: Medicare Other

## 2018-07-29 ENCOUNTER — Ambulatory Visit (HOSPITAL_COMMUNITY): Admission: RE | Admit: 2018-07-29 | Payer: Medicare Other | Source: Ambulatory Visit

## 2018-07-30 ENCOUNTER — Ambulatory Visit (HOSPITAL_COMMUNITY)
Admission: RE | Admit: 2018-07-30 | Discharge: 2018-07-30 | Disposition: A | Discharge status: Home / Self Care | Payer: Medicare Other | Source: Ambulatory Visit | Attending: Oncology | Admitting: Oncology

## 2018-07-30 DIAGNOSIS — M899 Disorder of bone, unspecified: Secondary | ICD-10-CM | POA: Insufficient documentation

## 2018-07-30 DIAGNOSIS — M79604 Pain in right leg: Secondary | ICD-10-CM | POA: Diagnosis not present

## 2018-07-30 DIAGNOSIS — C649 Malignant neoplasm of unspecified kidney, except renal pelvis: Secondary | ICD-10-CM | POA: Diagnosis not present

## 2018-07-30 DIAGNOSIS — R2241 Localized swelling, mass and lump, right lower limb: Secondary | ICD-10-CM | POA: Insufficient documentation

## 2018-07-30 DIAGNOSIS — R600 Localized edema: Secondary | ICD-10-CM | POA: Diagnosis not present

## 2018-07-30 MED ORDER — GADOBENATE DIMEGLUMINE 529 MG/ML IV SOLN
20.0000 mL | Freq: Once | INTRAVENOUS | Status: AC | PRN
  Administered 2018-07-30: 18 mL via INTRAVENOUS

## 2018-08-02 ENCOUNTER — Other Ambulatory Visit: Payer: Self-pay | Admitting: Oncology

## 2018-08-02 DIAGNOSIS — C7951 Secondary malignant neoplasm of bone: Secondary | ICD-10-CM

## 2018-08-03 ENCOUNTER — Encounter: Payer: Self-pay | Admitting: Radiation Oncology

## 2018-08-03 ENCOUNTER — Telehealth: Payer: Self-pay | Admitting: *Deleted

## 2018-08-03 NOTE — Telephone Encounter (Signed)
Returned patient call. States he rec'd a call from this office, but his phone ran out of minutes and he did not get the message. Upon looking thru the chart, no call was documented

## 2018-08-04 ENCOUNTER — Other Ambulatory Visit: Payer: Self-pay | Admitting: *Deleted

## 2018-08-04 DIAGNOSIS — G620 Drug-induced polyneuropathy: Secondary | ICD-10-CM | POA: Diagnosis not present

## 2018-08-04 DIAGNOSIS — G629 Polyneuropathy, unspecified: Secondary | ICD-10-CM | POA: Diagnosis not present

## 2018-08-04 DIAGNOSIS — C649 Malignant neoplasm of unspecified kidney, except renal pelvis: Secondary | ICD-10-CM

## 2018-08-04 DIAGNOSIS — C642 Malignant neoplasm of left kidney, except renal pelvis: Secondary | ICD-10-CM | POA: Diagnosis not present

## 2018-08-04 DIAGNOSIS — R030 Elevated blood-pressure reading, without diagnosis of hypertension: Secondary | ICD-10-CM | POA: Diagnosis not present

## 2018-08-04 DIAGNOSIS — Z9689 Presence of other specified functional implants: Secondary | ICD-10-CM | POA: Diagnosis not present

## 2018-08-04 MED ORDER — CABOZANTINIB S-MALATE 60 MG PO TABS: 60.0000 mg | ORAL_TABLET | Freq: Every day | ORAL | 0 refills | Status: DC

## 2018-08-05 ENCOUNTER — Telehealth: Payer: Self-pay | Admitting: *Deleted

## 2018-08-05 NOTE — Telephone Encounter (Signed)
Patient calling to say he has been anxious IR:JJOACZ x-rays and that valium 5 mg every 6 hours is not helping. He is requesting something stronger.

## 2018-08-09 NOTE — Progress Notes (Signed)
Histology and Location of Primary Cancer:Renal Carcinoma with new tibial lesion  Sites of Visceral and Bony Metastatic Disease: Proximal right tibial, increased lower extremity pain, right ankle   Location(s) of Symptomatic Metastases:Proximal right tibial, increased lower extremity pain on the right side.  His pain is noted in the distal part of his right leg and ankle.  He also reported some soft tissue swelling and skin indentation  is noted.  Past/Anticipated chemotherapy by medical oncology, if any:Dr. [name]  Current therapy:  Cabometyx 60 mg daily started in November 2016.   Prior Therapy: 1. Status post laparoscopic radical nephrectomy.  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  2. Patient status post thoracotomy for a synchronous metastatic lung lesions done October 2009.   3. Patient is status post stereotactic radiotherapy to pulmonary nodules in May of 2010. 4. He is S/P Sutent 50 mg 4 weeks on 2 weeks off from 10/2010 to 03/2013. He progressed at that time.  5. He is S/P radiation to the right sacral bone between 4/22 to 4/30.  6. He is S/P XRT to the left shoulder 03/20/14 to 03/31/14. 7. Votrient 800 mg by mouth daily from 03/2013 through 06/22/2015. Discontinued secondary to disease progression. 8. Nivolumab 3 mg/kg given every 2 weeks started on 06/29/2015. He is status post 4 cycles completed 08/10/2015. He developed disease progression in September 2016.  9. Status post radiation therapy to the left mid fibula completed on 11/14/2015. He received a grade 1 fraction. 10. He is status post radiation to the proximal and distal femur over 2 weeks and 10 fractions of total of 30 Gy.  This was completed in March 2019.   IMPRESSION:07-30-18 MR Ankle Right W WO Contrast IMPRESSION 1. No aggressive osseous lesion to suggest metastatic disease. 2. No acute osseous injury of the right ankle    07-30-18 MR Tibia Fibia Right W WO  Contrast  IMPRESSION: 1. 4.2 x 6.5 x 5.5 cm expansile bone lesion in the proximal right tibial epiphysis and metaphysis most consistent with metastatic renal cell carcinoma with adjacent marrow edema. 2. 5 x 3.7 cm expansile mass involving the mid left fibular diaphysis most consistent with metastatic renal cell carcinoma.    Pain on a scale of 0-10 is:  6/10 right tibia taking Dilaudid   If Spine Met(s), symptoms, if any, include:pain  Bowel/Bladder retention or incontinence (please describe): No  Numbness or weakness in extremities (please describe):Right leg weakness and numbness  Current Decadron regimen, if applicable:No  Ambulatory status? Walker? Wheelchair?: Ambulatory  SAFETY ISSUES:  Prior radiation? :03-15-18 03-30-18 Right femur, 11-14-15 Left fibula, 03-20-14 03-31-14 Left shoulder, 04-12-13 04-20-13 Bone mets right sacrum, 05-17,19,,24,2011 Right lung nodule      Pacemaker/ICD?:  Possible current pregnancy?:N/A  Is the patient on methotrexate?:No  Current Complaints / other details:  05-25-16Pain pump implantation Wt Readings from Last 3 Encounters:  08/11/18 182 lb 12.8 oz (82.9 kg)  07/14/18 185 lb 8 oz (84.1 kg)  05/19/18 190 lb 1.6 oz (86.2 kg)  BP (!) 129/92 (BP Location: Right Arm)   Pulse (!) 59   Temp 98 F (36.7 C) (Oral)   Resp 18   Ht 6' 3"  (1.905 m)   Wt 182 lb 12.8 oz (82.9 kg)   SpO2 98%   BMI 22.85 kg/m

## 2018-08-11 ENCOUNTER — Ambulatory Visit
Admission: RE | Admit: 2018-08-11 | Discharge: 2018-08-11 | Disposition: A | Discharge status: Home / Self Care | Payer: Medicare Other | Source: Ambulatory Visit | Attending: Urology | Admitting: Urology

## 2018-08-11 ENCOUNTER — Ambulatory Visit
Admission: RE | Admit: 2018-08-11 | Discharge: 2018-08-11 | Disposition: A | Discharge status: Home / Self Care | Payer: Medicare Other | Source: Ambulatory Visit | Attending: Radiation Oncology | Admitting: Radiation Oncology

## 2018-08-11 ENCOUNTER — Encounter: Payer: Self-pay | Admitting: Urology

## 2018-08-11 ENCOUNTER — Other Ambulatory Visit: Payer: Self-pay

## 2018-08-11 VITALS — BP 129/92 | HR 59 | Temp 98.0°F | Resp 18 | Ht 75.0 in | Wt 182.8 lb

## 2018-08-11 DIAGNOSIS — E119 Type 2 diabetes mellitus without complications: Secondary | ICD-10-CM | POA: Diagnosis not present

## 2018-08-11 DIAGNOSIS — C799 Secondary malignant neoplasm of unspecified site: Secondary | ICD-10-CM | POA: Insufficient documentation

## 2018-08-11 DIAGNOSIS — G893 Neoplasm related pain (acute) (chronic): Secondary | ICD-10-CM | POA: Diagnosis not present

## 2018-08-11 DIAGNOSIS — Z7901 Long term (current) use of anticoagulants: Secondary | ICD-10-CM | POA: Diagnosis not present

## 2018-08-11 DIAGNOSIS — Z85528 Personal history of other malignant neoplasm of kidney: Secondary | ICD-10-CM | POA: Insufficient documentation

## 2018-08-11 DIAGNOSIS — Z881 Allergy status to other antibiotic agents status: Secondary | ICD-10-CM | POA: Insufficient documentation

## 2018-08-11 DIAGNOSIS — C649 Malignant neoplasm of unspecified kidney, except renal pelvis: Secondary | ICD-10-CM

## 2018-08-11 DIAGNOSIS — Z885 Allergy status to narcotic agent status: Secondary | ICD-10-CM | POA: Insufficient documentation

## 2018-08-11 DIAGNOSIS — Z923 Personal history of irradiation: Secondary | ICD-10-CM | POA: Diagnosis not present

## 2018-08-11 DIAGNOSIS — F17210 Nicotine dependence, cigarettes, uncomplicated: Secondary | ICD-10-CM | POA: Insufficient documentation

## 2018-08-11 DIAGNOSIS — C7951 Secondary malignant neoplasm of bone: Secondary | ICD-10-CM

## 2018-08-11 DIAGNOSIS — Z79899 Other long term (current) drug therapy: Secondary | ICD-10-CM | POA: Insufficient documentation

## 2018-08-11 NOTE — Progress Notes (Signed)
Radiation Oncology         [phone] ________________________________  Outpatient Reconsult  Name: Keith [name] MRN: [id number]  Date of Service: 08/11/2018 DOB: 11-22-1966  KG:MWNUU, Keith Jew, MD  Keith Portela, MD   REFERRING PHYSICIAN: [name], MD  DIAGNOSIS:  52 yo male with Stage IV renal cell carcinoma with bony metastasis, now with new painful proximal right tibial metastasis.     ICD-10-CM   1. Bone metastasis ([place]) C79.51   2. Metastasis from malignant tumor of kidney ([facility]) C79.9    C64.9      HISTORY OF PRESENT ILLNESS: Keith [name] is a 52 y.o. male with a painful right femur metastasis from renal cell cancer - Stage IV. In brief summary, he has stage IV renal cell carcinoma diagnosed in 2009 with disease to the bone as well as the lung.  He is status post laparoscopic radical nephrectomy with pathology revealing an 8.5 cm stage IIIB clear cell histology in 07/2008.  He underwent thoracotomy for asynchronous metastatic lung lesions in October 2009 and status post stereotactic radiotherapy to 3 isolated metastatic pulmonary lesions in May 2011.  He was treated with Sutent 50 mg from November 2011 to April 2014 but this was discontinued due to disease progression.  He was started on Votrient 800 mg from April 2014 through July 2016 but this too was discontinued due to disease progression.  He completed palliative radiotherapy to the right sacral bone in April 2014 and then went on to have palliative radiotherapy to the left shoulder in April 2015.  He was treated with the Nivolumab from July 2016 through September 2016 but discontinued due to disease progression.  He developed painful bony metastasis in the left mid fibula and was treated with palliative radiotherapy in November 2016.  He started Carbometryx 60 mg daily in November 2016 and has continued this daily.  In January 2019, he developed 2 new bony metastases to the proximal and distal right femur. He was  subsequently treated with post-op palliative radiation to the proximal and distal right femur, following ORIF for stabilization with Dr. Alvan [name] in late March 2019. He tolerated treatment well and has not had any further significant pain in the right femur.  Interval History:   He has continued in routine follow up under the care and direction of Dr. Alen [name] and continues taking Carbometryx daily as prescribed.  His systemic disease appears stable on his most recent systemic imaging with CT  C/A/P on 07/07/2018 showing a stable appearance of the pulmonary metastasis without new or progressive disease in the chest and no significant change in the pancreatic metastasis.  Visualized bony lesions in the right inferior pubic rami and right sacral regions appear stable as does the right posterior rib metastasis and recently treated right femur metastases.  However, at the time of his most recent follow-up with Dr. Alen [name] on 07/14/2018, the patient continued to complain of increasing pain in the right lower extremity from the knee to the ankle with swelling in the right ankle.  Therefore, he underwent an MRI of the lower extremities on 07/30/18 for further evaluation.  The MRI right ankle revealed no metastasis to the right ankle, but the MRI tib/fib revealed a 4.2 x 6.5 x 5.5 cm lesion to the proximal right tibial epiphysis and metaphysis consistent with metastatic renal cell carcinoma with adjacent increased marrow edema.  Additionally, there was a 5 x 3.7 cm mid left fibular lesion (previously treated with 8 Gy in  1 fx in 10/2015).  He is not having any pain associated with the left lower extremity.   PREVIOUS RADIATION THERAPY: Yes  -03/15/2018-03/30/18: right proximal and distal femur was treated to 30 Gy in 10 fractions (post-op) -11/14/2015:  The 6 cm destructive left mid fibula metastasis was treated to 8 Gy in one fraction -03/20/2014-03/31/2014: left shoulder was treated to 30 gray in 10 fractions    -04/12/2013-04/20/2013: isolated right sacral oligometastatic deposit to 50 gray in 5 fractions -May 2011: stereotactic body radiotherapy to three isolated pulmonary metastases (1.2 cm in the right upper lobe, 5 mm in the right upper lobe and 1 cm in the right lung base) to 50 Gy in five fractions    PAST MEDICAL HISTORY:  Past Medical History:  Diagnosis Date  . Diabetes mellitus without complication ([place])    diet controlled only  . left renal ca d'd 2008  . 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 Metastasis to bone [facility]) 2014/2016      PAST SURGICAL HISTORY: Past Surgical History:  Procedure Laterality Date  . COLONOSCOPY W/ POLYPECTOMY    . FEMUR IM NAIL Right 02/16/2018   Procedure: INTRAMEDULLARY (IM) NAIL RIGHT FEMORAL;  Surgeon: Keith Cancel, MD;  Location: [facility]S;  Service: Orthopedics;  Laterality: Right;  . HAND SURGERY Right   . IR ANGIOGRAM EXTREMITY RIGHT  02/15/2018  . IR ANGIOGRAM SELECTIVE EACH ADDITIONAL VESSEL  02/15/2018  . IR ANGIOGRAM SELECTIVE EACH ADDITIONAL VESSEL  02/15/2018  . IR ANGIOGRAM SELECTIVE EACH ADDITIONAL VESSEL  02/15/2018  . IR ANGIOGRAM SELECTIVE EACH ADDITIONAL VESSEL  02/15/2018  . IR EMBO TUMOR ORGAN ISCHEMIA INFARCT INC GUIDE ROADMAPPING  02/15/2018  . IR EMBO TUMOR ORGAN ISCHEMIA INFARCT INC GUIDE ROADMAPPING  02/15/2018  . IR US GUIDE VASC ACCESS LEFT  02/15/2018  . LUNG REMOVAL, PARTIAL Right 09/2008  . NEPHRECTOMY RADICAL     Left   . PAIN PUMP IMPLANTATION N/A 05/16/2015   Procedure: Intrathecal pain pump placement;  Surgeon: Keith Hakim, MD;  Location: [facility];  Service: Neurosurgery;  Laterality: N/A;  Intrathecal pain pump placement    FAMILY HISTORY:  Family History  Problem Relation Age of Onset  . Hypertension Mother   . Diabetes Brother   . Irritable bowel syndrome Sister   . Cancer Maternal Aunt        ovarian  . Colon cancer Neg Hx     SOCIAL HISTORY:  Social History    Socioeconomic History  . Marital status: Married    Spouse name: Not on file  . Number of children: 2  . Years of education: Not on file  . Highest education level: Not on file  Occupational History  . Occupation: Careers adviser  . Financial resource strain: Not on file  . Food insecurity:    Worry: Not on file    Inability: Not on file  . Transportation needs:    Medical: Not on file    Non-medical: Not on file  Tobacco Use  . Smoking status: Current Every Day Smoker    Packs/day: 1.00    Years: 30.00    Pack years: 30.00    Types: Cigarettes  . Smokeless tobacco: Never Used  Substance and Sexual Activity  . Alcohol use: No  . Drug use: Yes    Types: Marijuana    Comment: Daily. Last used: last night.   . Sexual activity: Not Currently  Lifestyle  .  Physical activity:    Days per week: Not on file    Minutes per session: Not on file  . Stress: Not on file  Relationships  . Social connections:    Talks on phone: Not on file    Gets together: Not on file    Attends religious service: Not on file    Active member of club or organization: Not on file    Attends meetings of clubs or organizations: Not on file    Relationship status: Not on file  . Intimate partner violence:    Fear of current or ex partner: Not on file    Emotionally abused: Not on file    Physically abused: Not on file    Forced sexual activity: Not on file  Other Topics Concern  . Not on file  Social History Narrative   6 caffeine drinks daily    08-11-18  Unable to ask abuse questions wife with him today.    ALLERGIES: Ceftriaxone and Hydrocodone  MEDICATIONS:  Current Outpatient Medications  Medication Sig Dispense Refill  . cabozantinib (CABOMETYX) 60 MG tablet Take 1 tablet (60 mg total) by mouth daily. With at least 8 ounces of water on an empty stomach. Do not eat for 2 hours before or 1 hour after. Swallow whole. 30 tablet 0  . calcium carbonate (TUMS - DOSED IN MG  ELEMENTAL CALCIUM) 500 MG chewable tablet Chew 1 tablet by mouth as needed for indigestion or heartburn.    . diazepam (VALIUM) 5 MG tablet Take 1 tablet (5 mg total) by mouth every 6 (six) hours as needed. for anxiety 90 tablet 0  . diphenoxylate-atropine (LOMOTIL) 2.5-0.025 MG tablet Take 1 tablet by mouth 4 (four) times daily as needed for diarrhea or loose stools. 60 tablet 0  . DULoxetine (CYMBALTA) 60 MG capsule TAKE 1 CAPSULE (60 MG TOTAL) BY MOUTH 2 (TWO) TIMES DAILY. 180 capsule 0  . ferrous sulfate 325 (65 FE) MG tablet Take 1 tablet (325 mg total) by mouth 3 (three) times daily after meals.  3  . furosemide (LASIX) 20 MG tablet TAKE 1 TABLET BY MOUTH EVERY DAY AS NEEDED 30 tablet 0  . gabapentin (NEURONTIN) 300 MG capsule TAKE 1 CAPSULE BY MOUTH THREE TIMES A DAY 270 capsule 0  . HYDROmorphone (DILAUDID) 8 MG tablet Take 1 tablet (8 mg total) by mouth every 4 (four) hours as needed for severe pain. 30 tablet 0  . rivaroxaban (XARELTO) 20 MG TABS tablet Take 1 tablet (20 mg total) by mouth daily with supper. 90 tablet 3  . tiZANidine (ZANAFLEX) 4 MG tablet Take 1 tablet (4 mg total) by mouth every 8 (eight) hours as needed for muscle spasms. 30 tablet 0   No current facility-administered medications for this encounter.     REVIEW OF SYSTEMS:  On review of systems, the patient reports that he is doing well overall. He denies any chest pain, shortness of breath, cough, fevers, chills, night sweats, unintended weight changes. He denies any bowel or bladder disturbances, and denies abdominal pain, nausea or vomiting. He reports tenderness and swelling to the right ankle and pain to the upper-inner portion of his lower leg just below the knee. He denies pain to the left leg and any recent injury to the right lower extremity. He has been using narcotic pain medications with minimal, if any, effect. He reports increased pain with ambulation and improved pain with rest and elevation of the RLE. He  denies swelling, erythema  or increased warmth in the right knee but continues with persistent, mild swelling at the right ankle. He has chronic paraesthesias in the toes on both feet, unchanged recently.  A complete review of systems is obtained and is otherwise negative.    PHYSICAL EXAM:  Wt Readings from Last 3 Encounters:  08/11/18 182 lb 12.8 oz (82.9 kg)  07/14/18 185 lb 8 oz (84.1 kg)  05/19/18 190 lb 1.6 oz (86.2 kg)   Temp Readings from Last 3 Encounters:  08/11/18 98 F (36.7 C) (Oral)  07/14/18 97.8 F (36.6 C) (Oral)  05/19/18 97.8 F (36.6 C) (Oral)   BP Readings from Last 3 Encounters:  08/11/18 (!) 129/92  07/14/18 136/90  05/19/18 (!) 140/91   Pulse Readings from Last 3 Encounters:  08/11/18 (!) 59  07/14/18 77  05/19/18 70   Pain Assessment Pain Score: 6  Pain Frequency: Constant Pain Loc: Leg(right tibia)  In general this is a well appearing Caucasian male in no acute distress.  He is alert and oriented x4 and appropriate throughout the examination. HEENT reveals that the patient is normocephalic, atraumatic. EOMs are intact. PERRLA. Skin is intact without any evidence of gross lesions. Cardiovascular exam reveals a regular rate and rhythm, no clicks rubs or murmurs are auscultated. Chest is clear to auscultation bilaterally. Lymphatic assessment is performed and does not reveal any adenopathy in the cervical, supraclavicular, axillary, or inguinal chains. Abdomen has active bowel sounds in all quadrants and is intact. The abdomen is soft, non tender, non distended. Lower extremities are negative for pretibial pitting edema, deep calf tenderness, cyanosis or clubbing. Incisions at right hip and distal right femur appear well healed with no signs of infection. There is 1+ edema bilaterally at the ankles. He is tender to palpation over the right tibial plateau and popliteal fossa but no appreciable edema noted.  He is exquisitely tender to palpation over the mid-distal  shaft tibia but there is no palpable mass and no lesions to correlate on recent MRI scans. Sensation is intact to light touch in the LEs and FROM of hips and knees.  KPS = 90  100 - Normal; no complaints; no evidence of disease. 90   - Able to carry on normal activity; minor signs or symptoms of disease. 80   - Normal activity with effort; some signs or symptoms of disease. 43   - Cares for self; unable to carry on normal activity or to do active work. 60   - Requires occasional assistance, but is able to care for most of his personal needs. 50   - Requires considerable assistance and frequent medical care. 75   - Disabled; requires special care and assistance. 23   - Severely disabled; hospital admission is indicated although death not imminent. 52   - Very sick; hospital admission necessary; active supportive treatment necessary. 10   - Moribund; fatal processes progressing rapidly. 0     - Dead  Karnofsky DA, Abelmann [place], Craver LS and Burchenal [facility] [phone]) The use of the nitrogen mustards in the palliative treatment of carcinoma: with particular reference to bronchogenic carcinoma Cancer 1 634-56  LABORATORY DATA:  Lab Results  Component Value Date   WBC 4.6 07/06/2018   HGB 11.6 (L) 07/06/2018   HCT 35.8 (L) 07/06/2018   MCV 95.7 07/06/2018   PLT 150 07/06/2018   Lab Results  Component Value Date   NA 139 07/06/2018   K 4.2 07/06/2018   CL 103 07/06/2018  CO2 28 07/06/2018   Lab Results  Component Value Date   ALT 20 07/06/2018   AST 21 07/06/2018   ALKPHOS 94 07/06/2018   BILITOT 0.2 (L) 07/06/2018     RADIOGRAPHY: Mr Tibia Fibula Right W Wo Contrast  Result Date: 07/31/2018 CLINICAL DATA:  Stage IV kidney cancer. Right lower leg and ankle pain. EXAM: MRI OF LOWER RIGHT EXTREMITY WITHOUT AND WITH CONTRAST TECHNIQUE: Multiplanar, multisequence MR imaging of the right lower leg was performed both before and after administration of intravenous contrast. CONTRAST:  84m  MULTIHANCE GADOBENATE DIMEGLUMINE 529 MG/ML IV SOLN COMPARISON:  None. FINDINGS: Bones/Joint/Cartilage No fracture or dislocation. Normal alignment. No joint effusion. 4.2 x 6.5 x 5.5 cm expansile bone lesion in the proximal right tibial epiphysis and metaphysis most consistent with metastatic renal cell carcinoma with adjacent marrow edema. 5 x 3.7 cm expansile mass involving the mid left fibular diaphysis most consistent with metastatic renal cell carcinoma. Partial-thickness cartilage loss of the medial and lateral femorotibial compartments of the right knee. Ligaments Collateral ligaments are intact. Muscles and Tendons Muscles are normal.  No muscle atrophy. Soft tissue No fluid collection or hematoma.  No soft tissue mass. IMPRESSION: 1. 4.2 x 6.5 x 5.5 cm expansile bone lesion in the proximal right tibial epiphysis and metaphysis most consistent with metastatic renal cell carcinoma with adjacent marrow edema. 2. 5 x 3.7 cm expansile mass involving the mid left fibular diaphysis most consistent with metastatic renal cell carcinoma. Electronically Signed   By: HKathreen Devoid  On: 07/31/2018 11:05   Mr Ankle Right W Wo Contrast  Result Date: 07/31/2018 CLINICAL DATA:  Kidney cancer.  Leg and ankle pain. EXAM: MRI OF THE RIGHT ANKLE WITHOUT AND WITH CONTRAST TECHNIQUE: Multiplanar, multisequence MR imaging of the ankle was performed before and after the administration of intravenous contrast. CONTRAST:  138mMULTIHANCE GADOBENATE DIMEGLUMINE 529 MG/ML IV SOLN COMPARISON:  None. FINDINGS: TENDONS Peroneal: Peroneal longus tendon intact. Peroneal brevis intact. Posteromedial: Posterior tibial tendon intact. Flexor hallucis longus tendon intact. Flexor digitorum longus tendon intact. Anterior: Tibialis anterior tendon intact. Extensor hallucis longus tendon intact Extensor digitorum longus tendon intact. Achilles:  Intact. Plantar Fascia: Intact. LIGAMENTS Lateral: Anterior talofibular ligament intact.  Calcaneofibular ligament intact. Posterior talofibular ligament intact. Anterior and posterior tibiofibular ligaments intact. Medial: Deltoid ligament intact. Spring ligament intact. CARTILAGE Ankle Joint: No joint effusion. Normal ankle mortise. No chondral defect. Subtalar Joints/Sinus Tarsi: Normal subtalar joints. No subtalar joint effusion. Normal sinus tarsi. Bones: No aggressive osseous lesion. Subcortical reactive marrow changes in the medial talus adjacent to the medial malleolus. No fracture or dislocation. Soft Tissue: Mild muscle edema in the quadratus plantae muscle. IMPRESSION: IMPRESSION 1. No aggressive osseous lesion to suggest metastatic disease. 2.  No acute osseous injury of the right ankle. Electronically Signed   By: HeKathreen Devoid On: 07/31/2018 11:13      IMPRESSION/PLAN: 1. [age].o. male with Stage IV renal cell carcinoma with bony metastasis, now with new painful proximal right tibial metastasis.  Today, we talked to the patient and his significant other about the findings and workup thus far. Dr. MaTammi Klippelersonally reviewed the recent MRI imaging with the patient.  We discussed the natural history of metastatic renal cell carcinoma with painful bony metastasis and general treatment, highlighting the role of radiotherapy in the management. We discussed the available radiation techniques, and focused on the details of logistics and delivery. The recommendation is for 30 Gy in 10 fractions  to the right proximal tibia delivered over the course of 2 weeks. We reviewed the anticipated acute and late sequelae associated with radiation in this setting. The patient was encouraged to ask questions that were answered to his satisfaction.  At the conclusion of our conversation, the patient elects to proceed with palliative adjuvant radiotherapy to the right tibia. He freely signed written consent to proceed and a copy of this document is placed in his medical record.  He will undergo CT  Simulation for radiation planning on 08/12/18 at 8 am in anticipation of beginning treatment on Monday, 08/16/18.  He knows to call at anytime with any questions or concerns.    Nicholos Johns, PA-C    Tyler Pita, MD  [facility] Direct Dial: [phone]  Fax: [phone] .com  Skype  LinkedIn  This document serves as a record of services personally performed by Tyler Pita, MD and Freeman Caldron, PA-C. It was created on their behalf by Wilburn Mylar, a trained medical scribe. The creation of this record is based on the scribe's personal observations and the provider's statements to them. This document has been checked and approved by the attending provider.

## 2018-08-12 ENCOUNTER — Ambulatory Visit
Admission: RE | Admit: 2018-08-12 | Discharge: 2018-08-12 | Disposition: A | Discharge status: Home / Self Care | Payer: Medicare Other | Source: Ambulatory Visit | Attending: Radiation Oncology | Admitting: Radiation Oncology

## 2018-08-12 DIAGNOSIS — C649 Malignant neoplasm of unspecified kidney, except renal pelvis: Secondary | ICD-10-CM | POA: Diagnosis not present

## 2018-08-12 DIAGNOSIS — Z510 Encounter for antineoplastic radiation therapy: Secondary | ICD-10-CM | POA: Diagnosis not present

## 2018-08-12 DIAGNOSIS — C7951 Secondary malignant neoplasm of bone: Secondary | ICD-10-CM | POA: Diagnosis not present

## 2018-08-12 NOTE — Progress Notes (Signed)
  Radiation Oncology         [phone] ________________________________  Name: DERECK AGERTON MRN: [id number]  Date: 08/12/2018  DOB: 1966-06-09  SIMULATION AND TREATMENT PLANNING NOTE    ICD-10-CM   1. Bone metastasis ([place]) C79.51     DIAGNOSIS:  52 yo with painful right proximal tibia metastasis from renal cell carcinoma  NARRATIVE:  The patient was brought to the [facility].  Identity was confirmed.  All relevant records and images related to the planned course of therapy were reviewed.  The patient freely provided informed written consent to proceed with treatment after reviewing the details related to the planned course of therapy. The consent form was witnessed and verified by the simulation staff.  Then, the patient was set-up in a stable reproducible  supine position for radiation therapy.  CT images were obtained.  Surface markings were placed.  The CT images were loaded into the planning software.  Then the target and avoidance structures were contoured.  Treatment planning then occurred.  The radiation prescription was entered and confirmed.  Then, I designed and supervised the construction of a total of 2 medically necessary complex treatment devices.  I have requested : Isodose Plan.   PLAN:  The patient will receive 30 Gy in 10 fractions.  ________________________________  Sheral Apley Tammi Klippel, M.D.

## 2018-08-13 ENCOUNTER — Other Ambulatory Visit: Payer: Self-pay | Admitting: *Deleted

## 2018-08-13 DIAGNOSIS — Z510 Encounter for antineoplastic radiation therapy: Secondary | ICD-10-CM | POA: Diagnosis not present

## 2018-08-13 DIAGNOSIS — C649 Malignant neoplasm of unspecified kidney, except renal pelvis: Secondary | ICD-10-CM

## 2018-08-13 DIAGNOSIS — C7951 Secondary malignant neoplasm of bone: Secondary | ICD-10-CM | POA: Diagnosis not present

## 2018-08-13 DIAGNOSIS — C7800 Secondary malignant neoplasm of unspecified lung: Secondary | ICD-10-CM

## 2018-08-13 MED ORDER — DIAZEPAM 5 MG PO TABS
5.0000 mg | ORAL_TABLET | Freq: Four times a day (QID) | ORAL | 0 refills | Status: DC | PRN
Start: 2018-08-13 — End: 2018-08-17

## 2018-08-14 IMAGING — CT CT ABD-PEL WO/W CM
2 of 12 series · 11 of 46 positions shown, 17 images · IV contrast (isovue)
Comparison: 06/17/2016.

CLINICAL DATA: Metastatic renal cell carcinoma.

EXAM:
CT ABDOMEN AND PELVIS WITHOUT AND WITH CONTRAST
TECHNIQUE: Multidetector CT imaging of the abdomen and pelvis was performed
following the standard protocol before and following the bolus
administration of intravenous contrast.
CONTRAST:  100 cc Isovue 300

[Series 6: venous · axial · portal-venous · 0.80mm/px · z∈[-438,-57]mm · 9 of 159 slices shown, 15 images]
[im 16/159  soft-tissue]
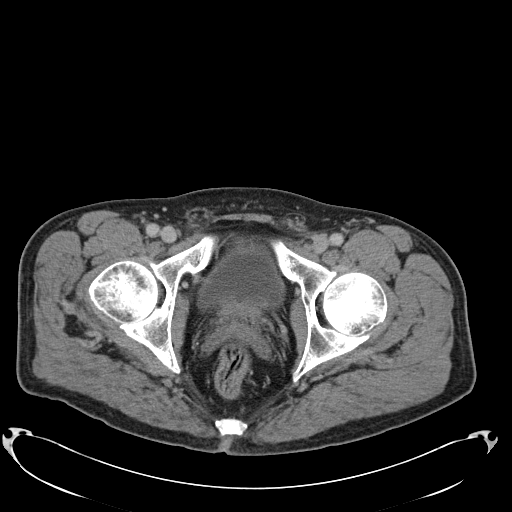
[im 16/159  bone]
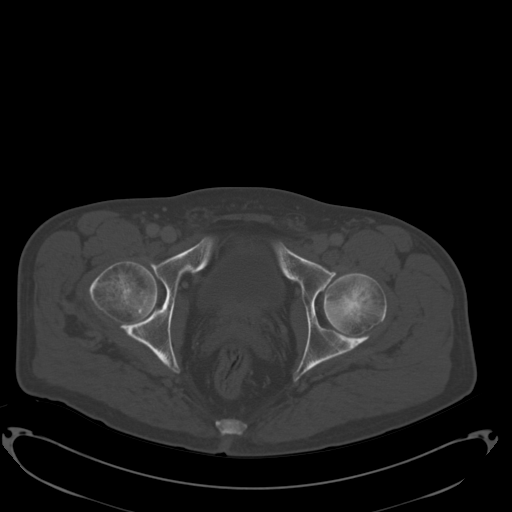
[im 32/159  soft-tissue]
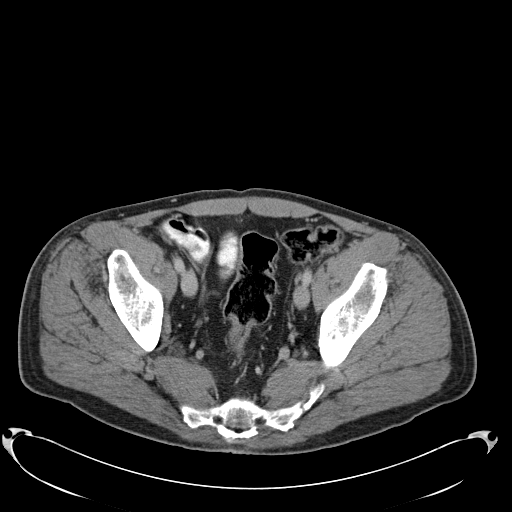
[im 48/159  soft-tissue]
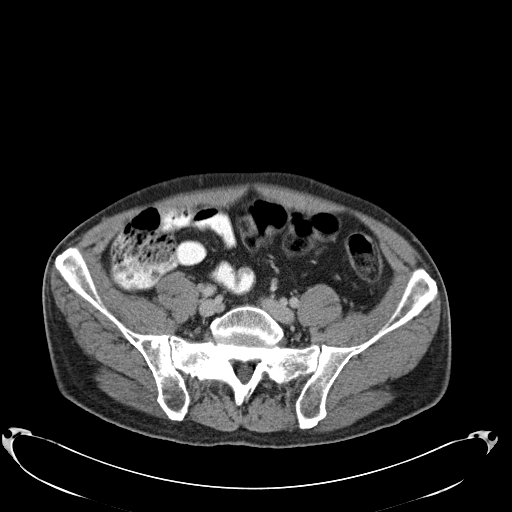
[im 64/159  soft-tissue]
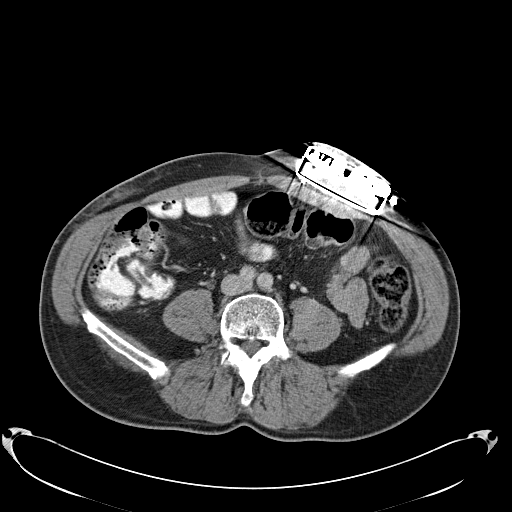
[im 80/159  soft-tissue]
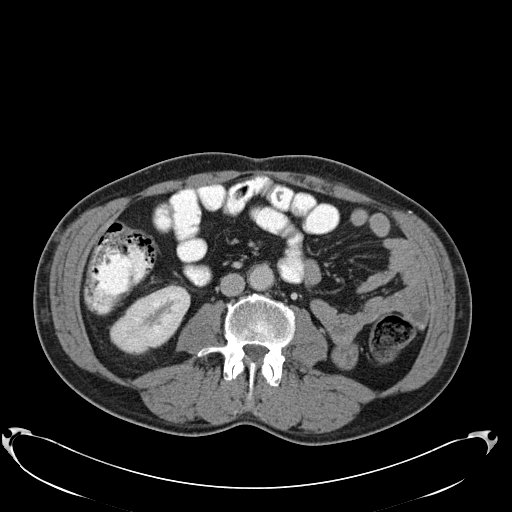
[im 95/159  soft-tissue]
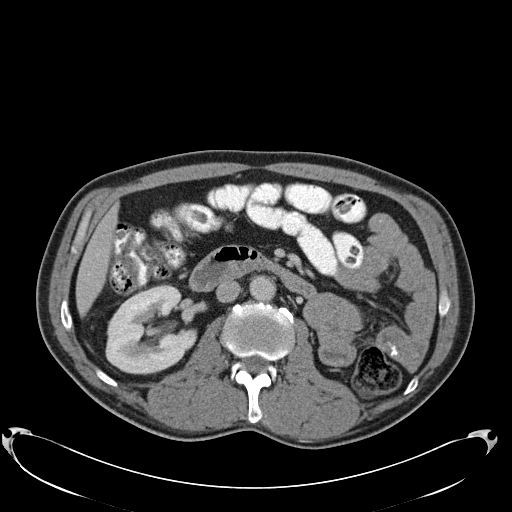
[im 95/159  lung]
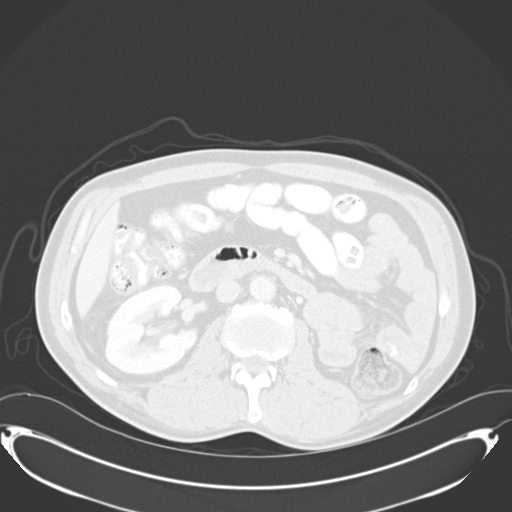
[im 111/159  soft-tissue]
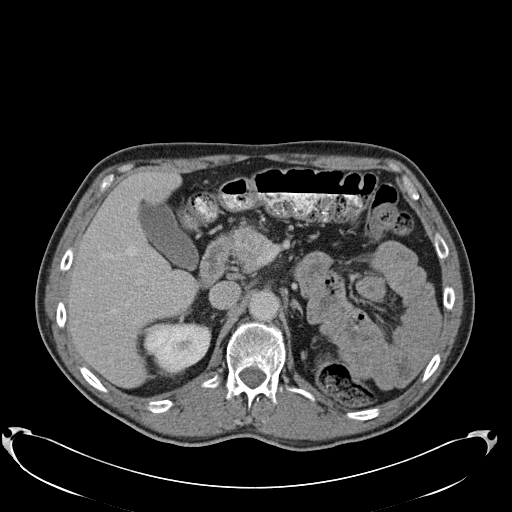
[im 111/159  lung]
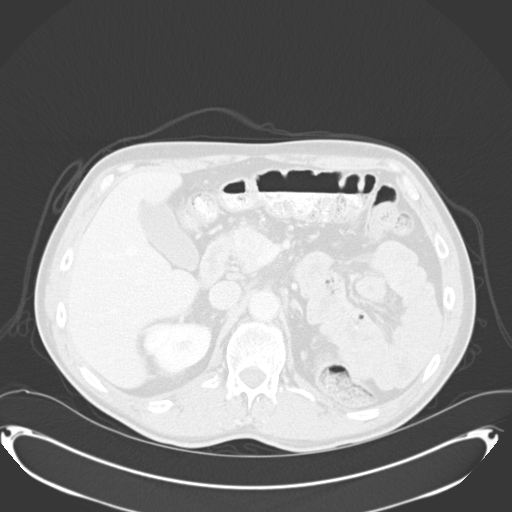
[im 127/159  soft-tissue]
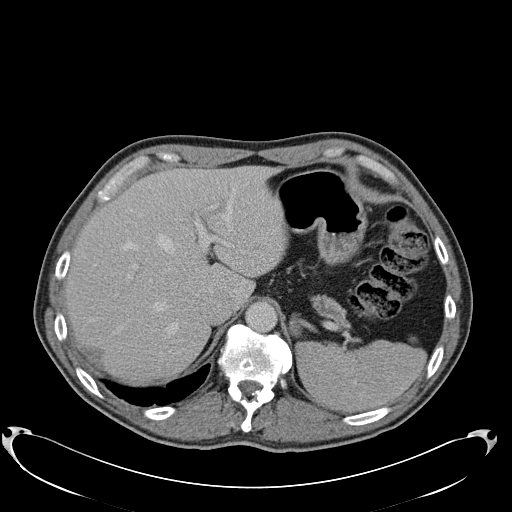
[im 127/159  lung]
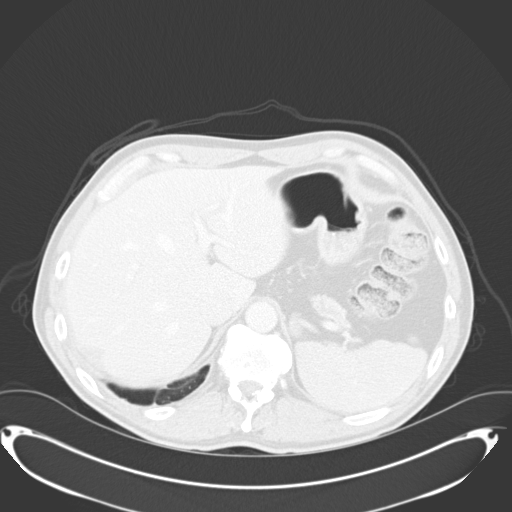
[im 143/159  soft-tissue]
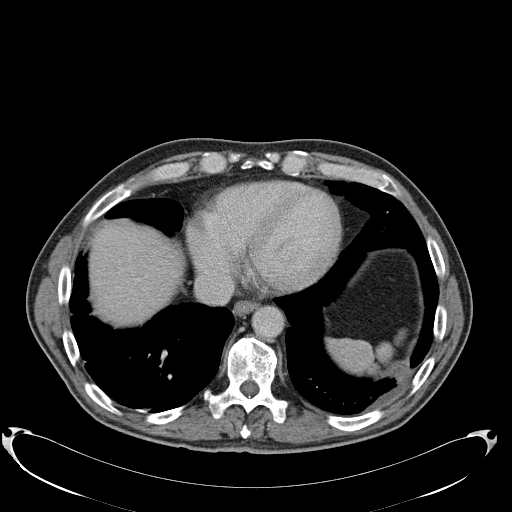
[im 143/159  lung]
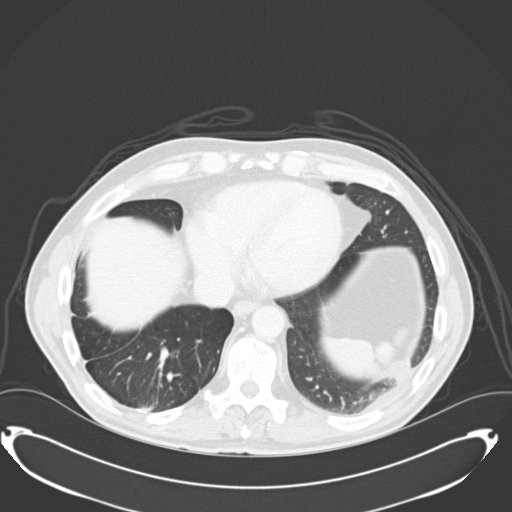
[im 143/159  bone]
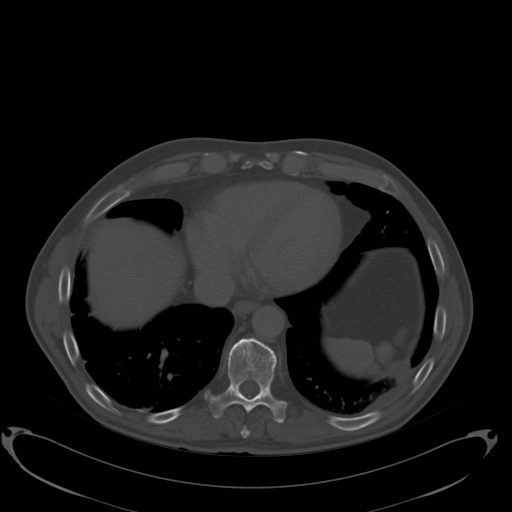

[Series 603: <mpr thick range(1)> · coronal · 0.80mm/px · 2 of 125 slices shown]
[im 42/125  soft-tissue]
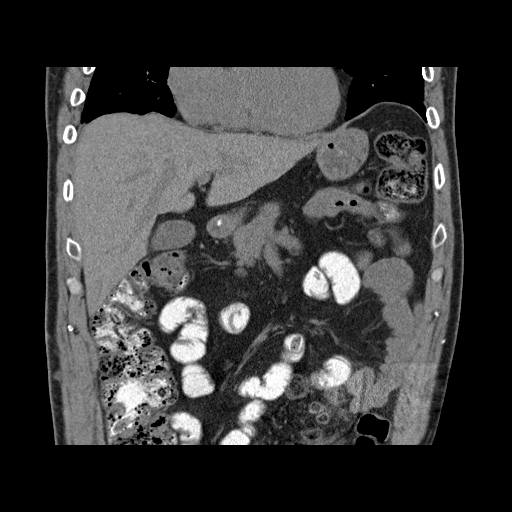
[im 83/125  soft-tissue]
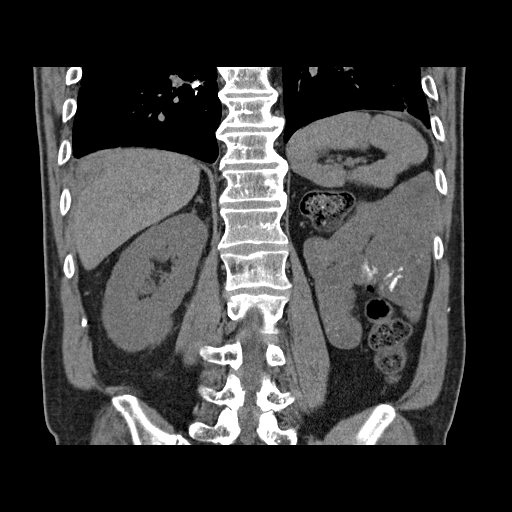

[11 of 46 positions shown; findings below may reference images not displayed]

FINDINGS: Lower chest: Right infrahilar soft tissue attenuation remains
irregular but not substantially changed in the interval measuring
2.1 cm today at the same location it measured 2.3 cm previously.
Right middle lobe volume loss again noted. The peripheral nodular
densities identified posteriorly in the left lower lobe are not
substantially changed with the smaller of the 2 measuring 10 x 12 mm
today on image 15 series 10 compared to 10 x 14 mm previously. Just
caudal to this is the second lesion measured on the prior study at
13 x 29 mm which compares to 15 x 24 mm today.

Hepatobiliary: No focal abnormality within the liver parenchyma.
There is no evidence for gallstones, gallbladder wall thickening, or
pericholecystic fluid. No intrahepatic or extrahepatic biliary
dilation.

Pancreas: Heterogeneously enhancing mass in the head of the pancreas
measures 2.5 x 2.1 cm today compared to 2.9 x 2.4 cm previously.
Hypervascular lesion body the pancreas is 15 x 9 mm today compared
to 14 x 9 mm previously. Main pancreatic duct is dilated in the body
and tail of the pancreas with a small hypervascular nodule near the
junction of the body and tail.

Spleen: No splenomegaly. No focal mass lesion.

Adrenals/Urinary Tract: No adrenal nodule or mass. Right kidney is
unremarkable. Left kidney is surgically absent. Right ureter has
normal imaging features. The urinary bladder appears normal for the
degree of distention.

Stomach/Bowel: Stomach is nondistended. No gastric wall thickening.
No evidence of outlet obstruction. Duodenum is normally positioned
as is the ligament of Treitz. No small bowel wall thickening. No
small bowel dilatation. The terminal ileum is normal. The appendix
is normal. No gross colonic mass. No colonic wall thickening. No
substantial diverticular change.

Vascular/Lymphatic: There is abdominal aortic atherosclerosis
without aneurysm. There is no gastrohepatic or hepatoduodenal
ligament lymphadenopathy. No intraperitoneal or retroperitoneal
lymphadenopathy. No pelvic sidewall lymphadenopathy.

Reproductive: The prostate gland and seminal vesicles have normal
imaging features.

Other: No intraperitoneal free fluid.

Musculoskeletal: Right sacral lesion measured previously at 2.5 x
3.3 cm now measures 2.4 x 3.4 cm. Right inferior pubic ramus lytic
lesion is 2.3 cm today compared to 2.2 cm previously. Right femoral
neck lesion seen on the previous is not substantially changed. Pain
pump identified in the anterior subcutaneous soft tissues of the
left abdomen.
IMPRESSION: 1. No substantial interval change in exam. Nodularity in the right
infrahilar lung and posterior left lower lobe, hyper enhancing
lesions of the pancreas and bony metastatic lesions are all similar
in size and appearance with no clear trend ports progression or
improvement.

## 2018-08-16 ENCOUNTER — Ambulatory Visit
Admission: RE | Admit: 2018-08-16 | Discharge: 2018-08-16 | Disposition: A | Discharge status: Home / Self Care | Payer: Medicare Other | Source: Ambulatory Visit | Attending: Radiation Oncology | Admitting: Radiation Oncology

## 2018-08-16 DIAGNOSIS — Z510 Encounter for antineoplastic radiation therapy: Secondary | ICD-10-CM | POA: Diagnosis not present

## 2018-08-16 DIAGNOSIS — C7951 Secondary malignant neoplasm of bone: Secondary | ICD-10-CM | POA: Diagnosis not present

## 2018-08-16 DIAGNOSIS — C649 Malignant neoplasm of unspecified kidney, except renal pelvis: Secondary | ICD-10-CM | POA: Diagnosis not present

## 2018-08-17 ENCOUNTER — Ambulatory Visit
Admission: RE | Admit: 2018-08-17 | Discharge: 2018-08-17 | Disposition: A | Discharge status: Home / Self Care | Payer: Medicare Other | Source: Ambulatory Visit | Attending: Radiation Oncology | Admitting: Radiation Oncology

## 2018-08-17 ENCOUNTER — Other Ambulatory Visit: Payer: Self-pay | Admitting: *Deleted

## 2018-08-17 DIAGNOSIS — Z510 Encounter for antineoplastic radiation therapy: Secondary | ICD-10-CM | POA: Diagnosis not present

## 2018-08-17 DIAGNOSIS — C7951 Secondary malignant neoplasm of bone: Secondary | ICD-10-CM | POA: Diagnosis not present

## 2018-08-17 DIAGNOSIS — C7800 Secondary malignant neoplasm of unspecified lung: Secondary | ICD-10-CM

## 2018-08-17 DIAGNOSIS — C649 Malignant neoplasm of unspecified kidney, except renal pelvis: Secondary | ICD-10-CM

## 2018-08-17 MED ORDER — DIAZEPAM 5 MG PO TABS
5.0000 mg | ORAL_TABLET | Freq: Four times a day (QID) | ORAL | 0 refills | Status: DC | PRN
Start: 2018-08-17 — End: 2018-11-05

## 2018-08-18 ENCOUNTER — Ambulatory Visit
Admission: RE | Admit: 2018-08-18 | Discharge: 2018-08-18 | Disposition: A | Discharge status: Home / Self Care | Payer: Medicare Other | Source: Ambulatory Visit | Attending: Radiation Oncology | Admitting: Radiation Oncology

## 2018-08-18 DIAGNOSIS — Z510 Encounter for antineoplastic radiation therapy: Secondary | ICD-10-CM | POA: Diagnosis not present

## 2018-08-18 DIAGNOSIS — C649 Malignant neoplasm of unspecified kidney, except renal pelvis: Secondary | ICD-10-CM | POA: Diagnosis not present

## 2018-08-18 DIAGNOSIS — C7951 Secondary malignant neoplasm of bone: Secondary | ICD-10-CM | POA: Diagnosis not present

## 2018-08-19 ENCOUNTER — Ambulatory Visit
Admission: RE | Admit: 2018-08-19 | Discharge: 2018-08-19 | Disposition: A | Discharge status: Home / Self Care | Payer: Medicare Other | Source: Ambulatory Visit | Attending: Radiation Oncology | Admitting: Radiation Oncology

## 2018-08-19 DIAGNOSIS — C7951 Secondary malignant neoplasm of bone: Secondary | ICD-10-CM | POA: Diagnosis not present

## 2018-08-19 DIAGNOSIS — Z510 Encounter for antineoplastic radiation therapy: Secondary | ICD-10-CM | POA: Diagnosis not present

## 2018-08-19 DIAGNOSIS — C649 Malignant neoplasm of unspecified kidney, except renal pelvis: Secondary | ICD-10-CM | POA: Diagnosis not present

## 2018-08-20 ENCOUNTER — Ambulatory Visit
Admission: RE | Admit: 2018-08-20 | Discharge: 2018-08-20 | Disposition: A | Discharge status: Home / Self Care | Payer: Medicare Other | Source: Ambulatory Visit | Attending: Radiation Oncology | Admitting: Radiation Oncology

## 2018-08-20 DIAGNOSIS — Z510 Encounter for antineoplastic radiation therapy: Secondary | ICD-10-CM | POA: Diagnosis not present

## 2018-08-20 DIAGNOSIS — C7951 Secondary malignant neoplasm of bone: Secondary | ICD-10-CM | POA: Diagnosis not present

## 2018-08-20 DIAGNOSIS — C649 Malignant neoplasm of unspecified kidney, except renal pelvis: Secondary | ICD-10-CM | POA: Diagnosis not present

## 2018-08-24 ENCOUNTER — Other Ambulatory Visit: Payer: Self-pay

## 2018-08-24 ENCOUNTER — Ambulatory Visit
Admission: RE | Admit: 2018-08-24 | Discharge: 2018-08-24 | Disposition: A | Discharge status: Home / Self Care | Payer: Medicare Other | Source: Ambulatory Visit | Attending: Radiation Oncology | Admitting: Radiation Oncology

## 2018-08-24 DIAGNOSIS — Z510 Encounter for antineoplastic radiation therapy: Secondary | ICD-10-CM | POA: Insufficient documentation

## 2018-08-24 DIAGNOSIS — C7951 Secondary malignant neoplasm of bone: Secondary | ICD-10-CM | POA: Diagnosis not present

## 2018-08-24 DIAGNOSIS — C649 Malignant neoplasm of unspecified kidney, except renal pelvis: Secondary | ICD-10-CM | POA: Insufficient documentation

## 2018-08-25 ENCOUNTER — Inpatient Hospital Stay: Payer: Medicare Other | Attending: Oncology

## 2018-08-25 ENCOUNTER — Inpatient Hospital Stay (HOSPITAL_BASED_OUTPATIENT_CLINIC_OR_DEPARTMENT_OTHER): Payer: Medicare Other | Admitting: Oncology

## 2018-08-25 ENCOUNTER — Ambulatory Visit
Admission: RE | Admit: 2018-08-25 | Discharge: 2018-08-25 | Disposition: A | Discharge status: Home / Self Care | Payer: Medicare Other | Source: Ambulatory Visit | Attending: Radiation Oncology | Admitting: Radiation Oncology

## 2018-08-25 VITALS — BP 158/103 | HR 73 | Temp 97.8°F | Resp 18 | Ht 75.0 in | Wt 184.3 lb

## 2018-08-25 DIAGNOSIS — D649 Anemia, unspecified: Secondary | ICD-10-CM

## 2018-08-25 DIAGNOSIS — C649 Malignant neoplasm of unspecified kidney, except renal pelvis: Secondary | ICD-10-CM

## 2018-08-25 DIAGNOSIS — Z86711 Personal history of pulmonary embolism: Secondary | ICD-10-CM | POA: Diagnosis not present

## 2018-08-25 DIAGNOSIS — C7889 Secondary malignant neoplasm of other digestive organs: Secondary | ICD-10-CM | POA: Insufficient documentation

## 2018-08-25 DIAGNOSIS — Z79899 Other long term (current) drug therapy: Secondary | ICD-10-CM | POA: Diagnosis not present

## 2018-08-25 DIAGNOSIS — C641 Malignant neoplasm of right kidney, except renal pelvis: Secondary | ICD-10-CM | POA: Diagnosis not present

## 2018-08-25 DIAGNOSIS — C7951 Secondary malignant neoplasm of bone: Secondary | ICD-10-CM | POA: Insufficient documentation

## 2018-08-25 DIAGNOSIS — Z510 Encounter for antineoplastic radiation therapy: Secondary | ICD-10-CM | POA: Diagnosis not present

## 2018-08-25 DIAGNOSIS — Z7901 Long term (current) use of anticoagulants: Secondary | ICD-10-CM

## 2018-08-25 LAB — CBC WITH DIFFERENTIAL (CANCER CENTER ONLY)
Basophils Absolute: 0 10*3/uL (ref 0.0–0.1)
Basophils Relative: 1 %
Eosinophils Absolute: 0.2 10*3/uL (ref 0.0–0.5)
Eosinophils Relative: 3 %
HCT: 37.3 % — ABNORMAL LOW (ref 38.4–49.9)
Hemoglobin: 11.3 g/dL — ABNORMAL LOW (ref 13.0–17.1)
Lymphocytes Relative: 28 %
Lymphs Abs: 1.3 10*3/uL (ref 0.9–3.3)
MCH: 30.5 pg (ref 27.2–33.4)
MCHC: 30.3 g/dL — ABNORMAL LOW (ref 32.0–36.0)
MCV: 100.5 fL — ABNORMAL HIGH (ref 79.3–98.0)
Monocytes Absolute: 0.2 10*3/uL (ref 0.1–0.9)
Monocytes Relative: 5 %
Neutro Abs: 2.9 10*3/uL (ref 1.5–6.5)
Neutrophils Relative %: 63 %
Platelet Count: 119 10*3/uL — ABNORMAL LOW (ref 140–400)
RBC: 3.71 MIL/uL — ABNORMAL LOW (ref 4.20–5.82)
RDW: 17.7 % — ABNORMAL HIGH (ref 11.0–14.6)
WBC Count: 4.6 10*3/uL (ref 4.0–10.3)

## 2018-08-25 LAB — CMP (CANCER CENTER ONLY)
ALT: 57 U/L — ABNORMAL HIGH (ref 0–44)
AST: 40 U/L (ref 15–41)
Albumin: 3.2 g/dL — ABNORMAL LOW (ref 3.5–5.0)
Alkaline Phosphatase: 111 U/L (ref 38–126)
Anion gap: 7 (ref 5–15)
BUN: 11 mg/dL (ref 6–20)
CO2: 27 mmol/L (ref 22–32)
Calcium: 8.9 mg/dL (ref 8.9–10.3)
Chloride: 108 mmol/L (ref 98–111)
Creatinine: 1.03 mg/dL (ref 0.61–1.24)
GFR, Est AFR Am: >60 mL/min (ref >60)
GFR, Estimated: >60 mL/min (ref >60)
Glucose, Bld: 88 mg/dL (ref 70–99)
Potassium: 4.3 mmol/L (ref 3.5–5.1)
Sodium: 142 mmol/L (ref 135–145)
Total Bilirubin: 0.2 mg/dL — ABNORMAL LOW (ref 0.3–1.2)
Total Protein: 7 g/dL (ref 6.5–8.1)

## 2018-08-25 MED ORDER — DIPHENOXYLATE-ATROPINE 2.5-0.025 MG PO TABS: 1.0000 | ORAL_TABLET | Freq: Four times a day (QID) | ORAL | 0 refills | Status: DC | PRN

## 2018-08-25 NOTE — Progress Notes (Signed)
Hematology and Oncology Follow Up Visit  Keith [name] [id number] 06-08-1966 52 y.o. 08/25/2018 12:22 PM  Keith Bring, MD  [name], M.D.  [name], MD  [name], MD    Principle Diagnosis: 52 year old man with renal cell carcinoma diagnosed in 2009 with lung involvement indicating stage IV disease.  He has disease involvement in the bone as well as the pancreas.  Prior Therapy: 1. Status post laparoscopic radical nephrectomy.  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  2. Patient status post thoracotomy for a synchronous metastatic lung lesions done October 2009.   3. Patient is status post stereotactic radiotherapy to pulmonary nodules in May of 2010. 4. He is S/P Sutent 50 mg 4 weeks on 2 weeks off from 10/2010 to 03/2013. He progressed at that time.  5. He is S/P radiation to the right sacral bone between 4/22 to 4/30.  6. He is S/P XRT to the left shoulder 03/20/14 to 03/31/14. 7. Votrient 800 mg by mouth daily from 03/2013 through 06/22/2015. Discontinued secondary to disease progression. 8. Nivolumab 3 mg/kg given every 2 weeks started on 06/29/2015. He is status post 4 cycles completed 08/10/2015. He developed disease progression in September 2016.  9. Status post radiation therapy to the left mid fibula completed on 11/14/2015. He received a grade 1 fraction. 10. He is status post radiation to the proximal and distal femur over 2 weeks and 10 fractions of total of 30 Gy.  This was completed in March 2019.    Current therapy:    Cabometyx 60 mg daily started in November 2016.   Radiation therapy to the right tibia.  Last treatment scheduled for September 9.  Interim History:  [name] presents today for a follow-up by himself.  Since the last visit, he started radiation therapy under the care of Dr. Tammi Klippel for his right tibial metastasis.  His pain is much improved and has tolerated therapy reasonably well.  He did report one  episode of vomiting that has resolved at this time.  He is performance status and activity level remained reasonable and slightly improved.  He continues to tolerate Cabometyx 60 mg without any recent complications.  He denies any worsening diarrhea, excessive fatigue or bleeding issues.  His performance status and quality of life not dramatically changed.  He did not report any headaches blurred vision or double vision.  He denies any dizziness or change in his mood.  He denies any fevers or chills or sweats.  He lost a few pounds since the start of radiation but is eating better.  He does not report any chest pain, palpitation, orthopnea.  He does not report any cough, wheezing or hemoptysis.  He denies any early satiety.  He denies any hematochezia or melena.  He denies any frequency urgency or hematuria. He does not report any rashes or lesions or petechiae. He does not report any lymphadenopathy or bleeding tendencies.  He does not report any heat or cold intolerance.  Remainder of his review of system is negative.   Medications: I have reviewed the patient's current medications.  Current Outpatient Medications  Medication Sig Dispense Refill  . cabozantinib (CABOMETYX) 60 MG tablet Take 1 tablet (60 mg total) by mouth daily. With at least 8 ounces of water on an empty stomach. Do not eat for 2 hours before or 1 hour after. Swallow whole. 30 tablet 0  . calcium carbonate (TUMS - DOSED IN MG ELEMENTAL CALCIUM)  500 MG chewable tablet Chew 1 tablet by mouth as needed for indigestion or heartburn.    . diazepam (VALIUM) 5 MG tablet Take 1 tablet (5 mg total) by mouth every 6 (six) hours as needed. for anxiety 90 tablet 0  . diphenoxylate-atropine (LOMOTIL) 2.5-0.025 MG tablet Take 1 tablet by mouth 4 (four) times daily as needed for diarrhea or loose stools. 60 tablet 0  . DULoxetine (CYMBALTA) 60 MG capsule TAKE 1 CAPSULE (60 MG TOTAL) BY MOUTH 2 (TWO) TIMES DAILY. 180 capsule 0  . ferrous sulfate  325 (65 FE) MG tablet Take 1 tablet (325 mg total) by mouth 3 (three) times daily after meals.  3  . furosemide (LASIX) 20 MG tablet TAKE 1 TABLET BY MOUTH EVERY DAY AS NEEDED 30 tablet 0  . gabapentin (NEURONTIN) 300 MG capsule TAKE 1 CAPSULE BY MOUTH THREE TIMES A DAY 270 capsule 0  . HYDROmorphone (DILAUDID) 8 MG tablet Take 1 tablet (8 mg total) by mouth every 4 (four) hours as needed for severe pain. 30 tablet 0  . rivaroxaban (XARELTO) 20 MG TABS tablet Take 1 tablet (20 mg total) by mouth daily with supper. 90 tablet 3  . tiZANidine (ZANAFLEX) 4 MG tablet Take 1 tablet (4 mg total) by mouth every 8 (eight) hours as needed for muscle spasms. 30 tablet 0   No current facility-administered medications for this visit.      Allergies:  Allergies  Allergen Reactions  . Ceftriaxone Hives  . Hydrocodone Swelling    Past Medical History, Surgical history, Social history, and Family History were reviewed and updated.   Physical Exam: Blood pressure (!) 158/103, pulse 73, temperature 97.8 F (36.6 C), temperature source Oral, resp. rate 18, height 6\' 3"  (1.905 m), weight 184 lb 4.8 oz (83.6 kg), SpO2 100 %.   ECOG: 1   General appearance: Comfortable appearing without any discomfort Head: Normocephalic without any trauma Oropharynx: Mucous membranes are moist and pink without any thrush or ulcers. Eyes: Pupils are equal and round reactive to light. Lymph nodes: No cervical, supraclavicular, inguinal or axillary lymphadenopathy.   Heart:regular rate and rhythm.  S1 and S2 without leg edema. Lung: Clear without any rhonchi or wheezes.  No dullness to percussion. Abdomin: Soft, nontender, nondistended with good bowel sounds.  No hepatosplenomegaly. Musculoskeletal: Slight swelling noted around his right knee and thigh.  Full range of motion noted otherwise. Neurological: No deficits noted on motor, sensory and deep tendon reflex exam. Skin: No petechial rash or dryness.  Appeared  moist.  Psychiatric: Mood and affect appeared appropriate.   .  Lab Results: Lab Results  Component Value Date   WBC 4.6 08/25/2018   HGB 11.3 (L) 08/25/2018   HCT 37.3 (L) 08/25/2018   MCV 100.5 (H) 08/25/2018   PLT 119 (L) 08/25/2018    EXAM: MRI OF LOWER RIGHT EXTREMITY WITHOUT AND WITH CONTRAST  TECHNIQUE: Multiplanar, multisequence MR imaging of the right lower leg was performed both before and after administration of intravenous contrast.  CONTRAST:  83mL MULTIHANCE GADOBENATE DIMEGLUMINE 529 MG/ML IV SOLN  COMPARISON:  None.  FINDINGS: Bones/Joint/Cartilage  No fracture or dislocation. Normal alignment. No joint effusion.  4.2 x 6.5 x 5.5 cm expansile bone lesion in the proximal right tibial epiphysis and metaphysis most consistent with metastatic renal cell carcinoma with adjacent marrow edema.  5 x 3.7 cm expansile mass involving the mid left fibular diaphysis most consistent with metastatic renal cell carcinoma.  Partial-thickness cartilage loss of the  medial and lateral femorotibial compartments of the right knee.  Ligaments  Collateral ligaments are intact.  Muscles and Tendons Muscles are normal.  No muscle atrophy.  Soft tissue No fluid collection or hematoma.  No soft tissue mass.  IMPRESSION: 1. 4.2 x 6.5 x 5.5 cm expansile bone lesion in the proximal right tibial epiphysis and metaphysis most consistent with metastatic renal cell carcinoma with adjacent marrow edema. 2. 5 x 3.7 cm expansile mass involving the mid left fibular diaphysis most consistent with metastatic renal cell carcinoma.    Impression and Plan:  52 year old man with  1.  Stage IV renal cell carcinoma diagnosed in 2009.  He has documented bone disease as well as pancreatic metastasis.  He remains on Cabometyx 60 mg without complications.  His disease status remains under control at this time.  Risks and benefits of continuing this therapy was  reviewed today and is agreeable to continue.  He will have repeat imaging studies in January 2020.  2.  Pulmonary embolism: He is currently on Xarelto which will be continued indefinitely.  Risks and benefits of continuing full dose anticoagulation was reviewed and he has no objections at this time.  3.  Anemia: Continues to be mild and asymptomatic.  We will continue to monitor moving forward.  4.  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  His pain is much improved.  5.  Diarrhea: Under control with Lomotil which was refilled for him today.  6.  Anxiety: No recent exacerbations and his mood is appropriate.  7.  Prognosis: His disease is incurable but his performance status remains excellent and aggressive therapy is warranted.  8.  Follow-up: We will be in 6 weeks to follow his progress.  25  minutes was spent with the patient face-to-face today.  More than 50% of time was dedicated to reviewing the natural course of his disease, reviewing alternative treatment options and coordinating his plan of care.     Zola Button, MD 08/25/18

## 2018-08-26 ENCOUNTER — Ambulatory Visit
Admission: RE | Admit: 2018-08-26 | Discharge: 2018-08-26 | Disposition: A | Discharge status: Home / Self Care | Payer: Medicare Other | Source: Ambulatory Visit | Attending: Radiation Oncology | Admitting: Radiation Oncology

## 2018-08-26 DIAGNOSIS — C649 Malignant neoplasm of unspecified kidney, except renal pelvis: Secondary | ICD-10-CM | POA: Diagnosis not present

## 2018-08-26 DIAGNOSIS — C7951 Secondary malignant neoplasm of bone: Secondary | ICD-10-CM | POA: Diagnosis not present

## 2018-08-26 DIAGNOSIS — Z510 Encounter for antineoplastic radiation therapy: Secondary | ICD-10-CM | POA: Diagnosis not present

## 2018-08-27 ENCOUNTER — Ambulatory Visit
Admission: RE | Admit: 2018-08-27 | Discharge: 2018-08-27 | Disposition: A | Discharge status: Home / Self Care | Payer: Medicare Other | Source: Ambulatory Visit | Attending: Radiation Oncology | Admitting: Radiation Oncology

## 2018-08-27 DIAGNOSIS — C649 Malignant neoplasm of unspecified kidney, except renal pelvis: Secondary | ICD-10-CM | POA: Diagnosis not present

## 2018-08-27 DIAGNOSIS — C7951 Secondary malignant neoplasm of bone: Secondary | ICD-10-CM | POA: Diagnosis not present

## 2018-08-27 DIAGNOSIS — Z510 Encounter for antineoplastic radiation therapy: Secondary | ICD-10-CM | POA: Diagnosis not present

## 2018-08-30 ENCOUNTER — Ambulatory Visit
Admission: RE | Admit: 2018-08-30 | Discharge: 2018-08-30 | Disposition: A | Discharge status: Home / Self Care | Payer: Medicare Other | Source: Ambulatory Visit | Attending: Radiation Oncology | Admitting: Radiation Oncology

## 2018-08-30 ENCOUNTER — Encounter: Payer: Self-pay | Admitting: Radiation Oncology

## 2018-08-30 DIAGNOSIS — C7951 Secondary malignant neoplasm of bone: Secondary | ICD-10-CM | POA: Diagnosis not present

## 2018-08-30 DIAGNOSIS — Z510 Encounter for antineoplastic radiation therapy: Secondary | ICD-10-CM | POA: Diagnosis not present

## 2018-08-30 DIAGNOSIS — C649 Malignant neoplasm of unspecified kidney, except renal pelvis: Secondary | ICD-10-CM | POA: Diagnosis not present

## 2018-08-30 NOTE — Progress Notes (Signed)
  Radiation Oncology         [phone] ________________________________  Name: Keith [name] MRN: [id number]  Date: 08/30/2018  DOB: 02-28-1966  End of Treatment Note  Diagnosis:   52 yo with painful right proximal tibia metastasis from renal cell carcinoma     Indication for treatment:  Palliative       Radiation treatment dates:   08/16/18 - 08/30/18  Site/dose:   Right Proximal Tibia / total of 30 Gy delivered in 10 fractions of 3 Gy  Beams/energy:   Isodose Plan/ 6X, 10X  Narrative: The patient tolerated radiation treatment relatively well. He reported decreasing fatigue as treatments went on. He denied skin changes or any gait changes but did experience right leg and right knee swelling and pain throughout treatments. He described the pain as aching and a 4/10 on his final treatment day.   Plan: The patient has completed radiation treatment. The patient will return to radiation oncology clinic for routine followup in one month. I advised him to call or return sooner if he has any questions or concerns related to his recovery or treatment. ________________________________  Sheral Apley. Tammi Klippel, M.D.  This document serves as a record of services personally performed by Tyler Pita, MD. It was created on his behalf by Wilburn Mylar, a trained medical scribe. The creation of this record is based on the scribe's personal observations and the provider's statements to them. This document has been checked and approved by the attending provider.

## 2018-09-07 ENCOUNTER — Other Ambulatory Visit: Payer: Self-pay | Admitting: *Deleted

## 2018-09-07 DIAGNOSIS — C649 Malignant neoplasm of unspecified kidney, except renal pelvis: Secondary | ICD-10-CM

## 2018-09-07 DIAGNOSIS — C7951 Secondary malignant neoplasm of bone: Secondary | ICD-10-CM

## 2018-09-07 DIAGNOSIS — K8689 Other specified diseases of pancreas: Secondary | ICD-10-CM

## 2018-09-07 DIAGNOSIS — C7800 Secondary malignant neoplasm of unspecified lung: Secondary | ICD-10-CM

## 2018-09-07 MED ORDER — CABOZANTINIB S-MALATE 60 MG PO TABS: 60.0000 mg | ORAL_TABLET | Freq: Every day | ORAL | 0 refills | Status: DC

## 2018-09-11 ENCOUNTER — Other Ambulatory Visit: Payer: Self-pay | Admitting: Oncology

## 2018-09-24 ENCOUNTER — Other Ambulatory Visit: Payer: Self-pay | Admitting: Oncology

## 2018-09-29 ENCOUNTER — Ambulatory Visit: Payer: Medicare Other | Admitting: Urology

## 2018-10-07 DIAGNOSIS — G620 Drug-induced polyneuropathy: Secondary | ICD-10-CM | POA: Diagnosis not present

## 2018-10-07 DIAGNOSIS — C642 Malignant neoplasm of left kidney, except renal pelvis: Secondary | ICD-10-CM | POA: Diagnosis not present

## 2018-10-07 DIAGNOSIS — I10 Essential (primary) hypertension: Secondary | ICD-10-CM | POA: Diagnosis not present

## 2018-10-07 DIAGNOSIS — M25551 Pain in right hip: Secondary | ICD-10-CM | POA: Diagnosis not present

## 2018-10-07 DIAGNOSIS — Z9689 Presence of other specified functional implants: Secondary | ICD-10-CM | POA: Diagnosis not present

## 2018-10-08 ENCOUNTER — Telehealth: Payer: Self-pay | Admitting: Oncology

## 2018-10-08 ENCOUNTER — Ambulatory Visit: Admission: RE | Admit: 2018-10-08 | Payer: Medicare Other | Source: Ambulatory Visit | Admitting: Urology

## 2018-10-08 ENCOUNTER — Telehealth: Payer: Self-pay | Admitting: *Deleted

## 2018-10-08 ENCOUNTER — Inpatient Hospital Stay: Payer: Medicare Other | Attending: Oncology | Admitting: Oncology

## 2018-10-08 ENCOUNTER — Inpatient Hospital Stay: Payer: Medicare Other

## 2018-10-08 VITALS — BP 139/98 | HR 73 | Temp 97.5°F | Resp 17 | Ht 75.0 in | Wt 186.3 lb

## 2018-10-08 DIAGNOSIS — C7889 Secondary malignant neoplasm of other digestive organs: Secondary | ICD-10-CM | POA: Diagnosis not present

## 2018-10-08 DIAGNOSIS — F419 Anxiety disorder, unspecified: Secondary | ICD-10-CM | POA: Diagnosis not present

## 2018-10-08 DIAGNOSIS — C7951 Secondary malignant neoplasm of bone: Secondary | ICD-10-CM | POA: Diagnosis not present

## 2018-10-08 DIAGNOSIS — C649 Malignant neoplasm of unspecified kidney, except renal pelvis: Secondary | ICD-10-CM | POA: Insufficient documentation

## 2018-10-08 DIAGNOSIS — R197 Diarrhea, unspecified: Secondary | ICD-10-CM | POA: Diagnosis not present

## 2018-10-08 DIAGNOSIS — D649 Anemia, unspecified: Secondary | ICD-10-CM

## 2018-10-08 DIAGNOSIS — Z86711 Personal history of pulmonary embolism: Secondary | ICD-10-CM

## 2018-10-08 DIAGNOSIS — Z7901 Long term (current) use of anticoagulants: Secondary | ICD-10-CM | POA: Insufficient documentation

## 2018-10-08 LAB — CBC WITH DIFFERENTIAL (CANCER CENTER ONLY)
Abs Immature Granulocytes: 0.01 10*3/uL (ref 0.00–0.07)
Basophils Absolute: 0 10*3/uL (ref 0.0–0.1)
Basophils Relative: 1 %
Eosinophils Absolute: 0.2 10*3/uL (ref 0.0–0.5)
Eosinophils Relative: 3 %
HCT: 37.6 % — ABNORMAL LOW (ref 39.0–52.0)
Hemoglobin: 11.5 g/dL — ABNORMAL LOW (ref 13.0–17.0)
Immature Granulocytes: 0 %
Lymphocytes Relative: 21 %
Lymphs Abs: 1.3 10*3/uL (ref 0.7–4.0)
MCH: 31.3 pg (ref 26.0–34.0)
MCHC: 30.6 g/dL (ref 30.0–36.0)
MCV: 102.2 fL — ABNORMAL HIGH (ref 80.0–100.0)
Monocytes Absolute: 0.4 10*3/uL (ref 0.1–1.0)
Monocytes Relative: 7 %
Neutro Abs: 4.1 10*3/uL (ref 1.7–7.7)
Neutrophils Relative %: 68 %
Platelet Count: 119 10*3/uL — ABNORMAL LOW (ref 150–400)
RBC: 3.68 MIL/uL — ABNORMAL LOW (ref 4.22–5.81)
RDW: 17 % — ABNORMAL HIGH (ref 11.5–15.5)
WBC Count: 6 10*3/uL (ref 4.0–10.5)
nRBC: 0 % (ref 0.0–0.2)

## 2018-10-08 LAB — CMP (CANCER CENTER ONLY)
ALT: 43 U/L (ref 0–44)
AST: 35 U/L (ref 15–41)
Albumin: 3.1 g/dL — ABNORMAL LOW (ref 3.5–5.0)
Alkaline Phosphatase: 109 U/L (ref 38–126)
Anion gap: 7 (ref 5–15)
BUN: 11 mg/dL (ref 6–20)
CO2: 30 mmol/L (ref 22–32)
Calcium: 8.5 mg/dL — ABNORMAL LOW (ref 8.9–10.3)
Chloride: 104 mmol/L (ref 98–111)
Creatinine: 1.07 mg/dL (ref 0.61–1.24)
GFR, Est AFR Am: >60 mL/min (ref >60)
GFR, Estimated: >60 mL/min (ref >60)
Glucose, Bld: 117 mg/dL — ABNORMAL HIGH (ref 70–99)
Potassium: 4.5 mmol/L (ref 3.5–5.1)
Sodium: 141 mmol/L (ref 135–145)
Total Bilirubin: 0.3 mg/dL (ref 0.3–1.2)
Total Protein: 6.7 g/dL (ref 6.5–8.1)

## 2018-10-08 NOTE — Telephone Encounter (Signed)
Scheduled appt per 10/18 los - gave patient AVS and calender per los.

## 2018-10-08 NOTE — Telephone Encounter (Signed)
Called patient to reschedule fu for 10-08-18, lvm for a return call

## 2018-10-08 NOTE — Progress Notes (Signed)
Hematology and Oncology Follow Up Visit  Keith [name] [id number] August 13, 1966 52 y.o. 10/08/2018 11:23 AM  [name], MD  [name], M.D.  [name], MD  [name], MD    Principle Diagnosis: 52 year old man with stage IV renal cell carcinoma diagnosed in 2009 with lung and bone metastasis.    Prior Therapy: 1. Status post laparoscopic radical nephrectomy.  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  2. Patient status post thoracotomy for a synchronous metastatic lung lesions done October 2009.   3. Patient is status post stereotactic radiotherapy to pulmonary nodules in May of 2010. 4. He is S/P Sutent 50 mg 4 weeks on 2 weeks off from 10/2010 to 03/2013. He progressed at that time.  5. He is S/P radiation to the right sacral bone between 4/22 to 4/30.  6. He is S/P XRT to the left shoulder 03/20/14 to 03/31/14. 7. Votrient 800 mg by mouth daily from 03/2013 through 06/22/2015. Discontinued secondary to disease progression. 8. Nivolumab 3 mg/kg given every 2 weeks started on 06/29/2015. He is status post 4 cycles completed 08/10/2015. He developed disease progression in September 2016.  9. Status post radiation therapy to the left mid fibula completed on 11/14/2015. He received a grade 1 fraction. 10. He is status post radiation to the proximal and distal femur over 2 weeks and 10 fractions of total of 30 Gy.  This was completed in March 2019. 11.   Radiation therapy to the right tibia.  Last treatment completed in September 2019.    Current therapy:    Cabometyx 60 mg daily started in November 2016.     Interim History:  Mr. Keith [name] returns today for repeat evaluation.  Since the last visit, he reports no major changes in his health.  He denies any recent complaints or complications related to Cabometyx except for loose bowel movements.  These vary in frequency between once a day to multiple times a day.  He does use Imodium and  Lomotil with overall reasonable control.  He denies any abdominal distention or hematochezia.  His appetite is improved and eating well.  His right leg pain has improved after radiation.  He does report intermittent hip pain however.  He is still ambulates without any falls or syncope.  He continues to enjoy excellent quality of life including fishing.  He did not report any headaches blurred vision or double vision.  He denies any alteration mental status or confusion.  He denies any fevers or chills or sweats.  He does not report any chest pain, palpitation, orthopnea.  He does not report any cough, wheezing or hemoptysis.  He denies any nausea, vomiting or abdominal distention.  He denies any frequency urgency or hematuria. He does not report any skin lesion or ecchymosis. He does not report any bleeding or clotting tendency.  He denies any palpable adenopathy.  Remainder of his review of system is negative.   Medications: I have reviewed the patient's current medications.  Current Outpatient Medications  Medication Sig Dispense Refill  . cabozantinib (CABOMETYX) 60 MG tablet Take 1 tablet (60 mg total) by mouth daily. With at least 8 ounces of water on an empty stomach. Do not eat for 2 hours before or 1 hour after. Swallow whole. 30 tablet 0  . calcium carbonate (TUMS - DOSED IN MG ELEMENTAL CALCIUM) 500 MG chewable tablet Chew 1 tablet by mouth as needed for indigestion or heartburn.    [name]  diazepam (VALIUM) 5 MG tablet Take 1 tablet (5 mg total) by mouth every 6 (six) hours as needed. for anxiety 90 tablet 0  . diphenoxylate-atropine (LOMOTIL) 2.5-0.025 MG tablet Take 1 tablet by mouth 4 (four) times daily as needed for diarrhea or loose stools. 60 tablet 0  . DULoxetine (CYMBALTA) 60 MG capsule TAKE 1 CAPSULE (60 MG TOTAL) BY MOUTH 2 (TWO) TIMES DAILY. 180 capsule 0  . ferrous sulfate 325 (65 FE) MG tablet Take 1 tablet (325 mg total) by mouth 3 (three) times daily after meals.  3  . furosemide  (LASIX) 20 MG tablet TAKE 1 TABLET BY MOUTH EVERY DAY AS NEEDED 30 tablet 0  . gabapentin (NEURONTIN) 300 MG capsule TAKE 1 CAPSULE BY MOUTH THREE TIMES A DAY 270 capsule 0  . HYDROmorphone (DILAUDID) 8 MG tablet Take 1 tablet (8 mg total) by mouth every 4 (four) hours as needed for severe pain. 30 tablet 0  . rivaroxaban (XARELTO) 20 MG TABS tablet Take 1 tablet (20 mg total) by mouth daily with supper. 90 tablet 3  . tiZANidine (ZANAFLEX) 4 MG tablet Take 1 tablet (4 mg total) by mouth every 8 (eight) hours as needed for muscle spasms. 30 tablet 0   No current facility-administered medications for this visit.      Allergies:  Allergies  Allergen Reactions  . Ceftriaxone Hives  . Hydrocodone Swelling    Past Medical History, Surgical history, Social history, and Family History were reviewed and updated.   Physical Exam: Blood pressure (!) 139/98, pulse 73, temperature (!) 97.5 F (36.4 C), temperature source Oral, resp. rate 17, height 6\' 3"  (1.905 m), weight 186 lb 4.8 oz (84.5 kg), SpO2 99 %.   ECOG: 1   General appearance: Comfortable appearing without any discomfort Head: Normocephalic without any trauma Oropharynx: Mucous membranes are moist and pink without any thrush or ulcers. Eyes: Pupils are equal and round reactive to light. Lymph nodes: No cervical, supraclavicular, inguinal or axillary lymphadenopathy.   Heart:regular rate and rhythm.  S1 and S2 without leg edema. Lung: Clear without any rhonchi or wheezes.  No dullness to percussion. Abdomin: Soft, nontender, nondistended with good bowel sounds.  No hepatosplenomegaly. Musculoskeletal: No joint deformity or effusion.  Full range of motion noted. Neurological: No deficits noted on motor, sensory and deep tendon reflex exam. Skin: No petechial rash or dryness.  Appeared moist.  .   .  Lab Results: Lab Results  Component Value Date   WBC 6.0 10/08/2018   HGB 11.5 (L) 10/08/2018   HCT 37.6 (L) 10/08/2018    MCV 102.2 (H) 10/08/2018   PLT 119 (L) 10/08/2018     carcinoma.    Impression and Plan:  52 year old man with  1.  Stage IV renal cell carcinoma with documented disease to the bone as well as pancreas.    He is currently on Cabometyx 60 mg and has tolerated it reasonably well.  He does report some mild diarrhea that is manageable with antidiarrheal medication.  Risks and benefits of continuing this treatment was discussed today and he is agreeable to continue.  We will obtain imaging studies in December 2019 for staging purposes.  2.  Pulmonary embolism: He remains on Xarelto without any complications.  This will continue indefinitely.  3.  Anemia: His hemoglobin is 11.5 and asymptomatic.  No transfusion or growth factor support is needed.  4.  Right tibial metastasis: Improved with radiation.  5.  Diarrhea: I continue to educate him  about the use of Imodium and the importance of controlling his diarrhea.  So far it is manageable.  6.  Anxiety: His mood remains appropriate and anxiety controlled at this time.  7.  Prognosis: His performance status remains reasonable and aggressive therapy is warranted.  His disease is incurable however.  8.  Follow-up: We will be in 6 weeks to f repeat imaging studies.  15  minutes was spent with the patient face-to-face today.  More than 50% of time was dedicated to reviewing his disease status, treatment options and managing future plan of care.     Zola Button, MD 10/08/18

## 2018-10-11 ENCOUNTER — Other Ambulatory Visit: Payer: Self-pay | Admitting: *Deleted

## 2018-10-11 DIAGNOSIS — C7800 Secondary malignant neoplasm of unspecified lung: Secondary | ICD-10-CM

## 2018-10-11 DIAGNOSIS — K8689 Other specified diseases of pancreas: Secondary | ICD-10-CM

## 2018-10-11 DIAGNOSIS — C7951 Secondary malignant neoplasm of bone: Secondary | ICD-10-CM

## 2018-10-11 DIAGNOSIS — C649 Malignant neoplasm of unspecified kidney, except renal pelvis: Secondary | ICD-10-CM

## 2018-10-11 MED ORDER — CABOZANTINIB S-MALATE 60 MG PO TABS: 60.0000 mg | ORAL_TABLET | Freq: Every day | ORAL | 0 refills | Status: DC

## 2018-11-01 ENCOUNTER — Other Ambulatory Visit: Payer: Self-pay | Admitting: Medical Oncology

## 2018-11-01 DIAGNOSIS — C7951 Secondary malignant neoplasm of bone: Secondary | ICD-10-CM

## 2018-11-01 DIAGNOSIS — K8689 Other specified diseases of pancreas: Secondary | ICD-10-CM

## 2018-11-01 DIAGNOSIS — C649 Malignant neoplasm of unspecified kidney, except renal pelvis: Secondary | ICD-10-CM

## 2018-11-01 DIAGNOSIS — C7800 Secondary malignant neoplasm of unspecified lung: Secondary | ICD-10-CM

## 2018-11-05 ENCOUNTER — Other Ambulatory Visit: Payer: Self-pay

## 2018-11-05 ENCOUNTER — Other Ambulatory Visit: Payer: Self-pay | Admitting: Oncology

## 2018-11-05 DIAGNOSIS — C7951 Secondary malignant neoplasm of bone: Secondary | ICD-10-CM

## 2018-11-05 DIAGNOSIS — C7800 Secondary malignant neoplasm of unspecified lung: Secondary | ICD-10-CM

## 2018-11-05 DIAGNOSIS — C649 Malignant neoplasm of unspecified kidney, except renal pelvis: Secondary | ICD-10-CM

## 2018-11-05 MED ORDER — FUROSEMIDE 20 MG PO TABS: 20.0000 mg | ORAL_TABLET | Freq: Every day | ORAL | 0 refills | Status: DC | PRN

## 2018-11-05 MED ORDER — DIAZEPAM 5 MG PO TABS: 5.0000 mg | ORAL_TABLET | Freq: Four times a day (QID) | ORAL | 0 refills | Status: DC | PRN

## 2018-11-23 ENCOUNTER — Other Ambulatory Visit: Payer: Self-pay | Admitting: *Deleted

## 2018-11-23 ENCOUNTER — Other Ambulatory Visit: Payer: Self-pay | Admitting: Oncology

## 2018-11-23 DIAGNOSIS — C649 Malignant neoplasm of unspecified kidney, except renal pelvis: Secondary | ICD-10-CM

## 2018-11-24 ENCOUNTER — Inpatient Hospital Stay: Payer: Medicare Other | Attending: Oncology

## 2018-11-24 ENCOUNTER — Ambulatory Visit (HOSPITAL_COMMUNITY)
Admission: RE | Admit: 2018-11-24 | Discharge: 2018-11-24 | Disposition: A | Discharge status: Home / Self Care | Payer: Medicare Other | Source: Ambulatory Visit | Attending: Oncology | Admitting: Oncology

## 2018-11-24 ENCOUNTER — Encounter (HOSPITAL_COMMUNITY): Payer: Self-pay

## 2018-11-24 DIAGNOSIS — C7951 Secondary malignant neoplasm of bone: Secondary | ICD-10-CM | POA: Diagnosis not present

## 2018-11-24 DIAGNOSIS — C7800 Secondary malignant neoplasm of unspecified lung: Secondary | ICD-10-CM | POA: Diagnosis not present

## 2018-11-24 DIAGNOSIS — R918 Other nonspecific abnormal finding of lung field: Secondary | ICD-10-CM | POA: Insufficient documentation

## 2018-11-24 DIAGNOSIS — R197 Diarrhea, unspecified: Secondary | ICD-10-CM | POA: Diagnosis not present

## 2018-11-24 DIAGNOSIS — Z86711 Personal history of pulmonary embolism: Secondary | ICD-10-CM | POA: Diagnosis not present

## 2018-11-24 DIAGNOSIS — F419 Anxiety disorder, unspecified: Secondary | ICD-10-CM | POA: Insufficient documentation

## 2018-11-24 DIAGNOSIS — Z923 Personal history of irradiation: Secondary | ICD-10-CM | POA: Diagnosis not present

## 2018-11-24 DIAGNOSIS — K869 Disease of pancreas, unspecified: Secondary | ICD-10-CM | POA: Diagnosis not present

## 2018-11-24 DIAGNOSIS — J439 Emphysema, unspecified: Secondary | ICD-10-CM | POA: Diagnosis not present

## 2018-11-24 DIAGNOSIS — Z7901 Long term (current) use of anticoagulants: Secondary | ICD-10-CM | POA: Diagnosis not present

## 2018-11-24 DIAGNOSIS — C649 Malignant neoplasm of unspecified kidney, except renal pelvis: Secondary | ICD-10-CM | POA: Diagnosis not present

## 2018-11-24 DIAGNOSIS — I712 Thoracic aortic aneurysm, without rupture: Secondary | ICD-10-CM | POA: Insufficient documentation

## 2018-11-24 DIAGNOSIS — K769 Liver disease, unspecified: Secondary | ICD-10-CM | POA: Insufficient documentation

## 2018-11-24 DIAGNOSIS — I700 Atherosclerosis of aorta: Secondary | ICD-10-CM | POA: Diagnosis not present

## 2018-11-24 DIAGNOSIS — C642 Malignant neoplasm of left kidney, except renal pelvis: Secondary | ICD-10-CM | POA: Diagnosis not present

## 2018-11-24 DIAGNOSIS — Z79899 Other long term (current) drug therapy: Secondary | ICD-10-CM | POA: Insufficient documentation

## 2018-11-24 DIAGNOSIS — Z905 Acquired absence of kidney: Secondary | ICD-10-CM | POA: Diagnosis not present

## 2018-11-24 DIAGNOSIS — Z5111 Encounter for antineoplastic chemotherapy: Secondary | ICD-10-CM | POA: Diagnosis not present

## 2018-11-24 LAB — CBC WITH DIFFERENTIAL (CANCER CENTER ONLY)
Abs Immature Granulocytes: 0.01 10*3/uL (ref 0.00–0.07)
Basophils Absolute: 0 10*3/uL (ref 0.0–0.1)
Basophils Relative: 1 %
Eosinophils Absolute: 0.2 10*3/uL (ref 0.0–0.5)
Eosinophils Relative: 3 %
HCT: 41.1 % (ref 39.0–52.0)
Hemoglobin: 12.6 g/dL — ABNORMAL LOW (ref 13.0–17.0)
Immature Granulocytes: 0 %
Lymphocytes Relative: 25 %
Lymphs Abs: 1.3 10*3/uL (ref 0.7–4.0)
MCH: 31.3 pg (ref 26.0–34.0)
MCHC: 30.7 g/dL (ref 30.0–36.0)
MCV: 102 fL — ABNORMAL HIGH (ref 80.0–100.0)
Monocytes Absolute: 0.3 10*3/uL (ref 0.1–1.0)
Monocytes Relative: 6 %
Neutro Abs: 3.4 10*3/uL (ref 1.7–7.7)
Neutrophils Relative %: 65 %
Platelet Count: 108 10*3/uL — ABNORMAL LOW (ref 150–400)
RBC: 4.03 MIL/uL — ABNORMAL LOW (ref 4.22–5.81)
RDW: 16 % — ABNORMAL HIGH (ref 11.5–15.5)
WBC Count: 5.3 10*3/uL (ref 4.0–10.5)
nRBC: 0 % (ref 0.0–0.2)

## 2018-11-24 LAB — CMP (CANCER CENTER ONLY)
ALT: 17 U/L (ref 0–44)
AST: 19 U/L (ref 15–41)
Albumin: 3.1 g/dL — ABNORMAL LOW (ref 3.5–5.0)
Alkaline Phosphatase: 85 U/L (ref 38–126)
Anion gap: 7 (ref 5–15)
BUN: 11 mg/dL (ref 6–20)
CO2: 28 mmol/L (ref 22–32)
Calcium: 8.7 mg/dL — ABNORMAL LOW (ref 8.9–10.3)
Chloride: 106 mmol/L (ref 98–111)
Creatinine: 1.02 mg/dL (ref 0.61–1.24)
GFR, Est AFR Am: >60 mL/min (ref >60)
GFR, Estimated: >60 mL/min (ref >60)
Glucose, Bld: 106 mg/dL — ABNORMAL HIGH (ref 70–99)
Potassium: 4.2 mmol/L (ref 3.5–5.1)
Sodium: 141 mmol/L (ref 135–145)
Total Bilirubin: 0.3 mg/dL (ref 0.3–1.2)
Total Protein: 6.6 g/dL (ref 6.5–8.1)

## 2018-11-24 MED ORDER — IOHEXOL 300 MG/ML  SOLN
100.0000 mL | Freq: Once | INTRAMUSCULAR | Status: AC | PRN
  Administered 2018-11-24: 100 mL via INTRAVENOUS

## 2018-11-24 MED ORDER — SODIUM CHLORIDE (PF) 0.9 % IJ SOLN
INTRAMUSCULAR | Status: AC
  Filled 2018-11-24: qty 50

## 2018-11-26 ENCOUNTER — Inpatient Hospital Stay (HOSPITAL_BASED_OUTPATIENT_CLINIC_OR_DEPARTMENT_OTHER): Payer: Medicare Other | Admitting: Oncology

## 2018-11-26 ENCOUNTER — Telehealth: Payer: Self-pay

## 2018-11-26 VITALS — BP 135/94 | HR 82 | Temp 97.7°F | Resp 18 | Ht 75.0 in | Wt 187.5 lb

## 2018-11-26 DIAGNOSIS — Z923 Personal history of irradiation: Secondary | ICD-10-CM | POA: Diagnosis not present

## 2018-11-26 DIAGNOSIS — I712 Thoracic aortic aneurysm, without rupture: Secondary | ICD-10-CM

## 2018-11-26 DIAGNOSIS — R197 Diarrhea, unspecified: Secondary | ICD-10-CM | POA: Diagnosis not present

## 2018-11-26 DIAGNOSIS — Z905 Acquired absence of kidney: Secondary | ICD-10-CM

## 2018-11-26 DIAGNOSIS — C7800 Secondary malignant neoplasm of unspecified lung: Secondary | ICD-10-CM | POA: Diagnosis not present

## 2018-11-26 DIAGNOSIS — Z79899 Other long term (current) drug therapy: Secondary | ICD-10-CM

## 2018-11-26 DIAGNOSIS — C7951 Secondary malignant neoplasm of bone: Secondary | ICD-10-CM | POA: Diagnosis not present

## 2018-11-26 DIAGNOSIS — Z7901 Long term (current) use of anticoagulants: Secondary | ICD-10-CM | POA: Diagnosis not present

## 2018-11-26 DIAGNOSIS — Z86711 Personal history of pulmonary embolism: Secondary | ICD-10-CM

## 2018-11-26 DIAGNOSIS — F419 Anxiety disorder, unspecified: Secondary | ICD-10-CM

## 2018-11-26 DIAGNOSIS — C649 Malignant neoplasm of unspecified kidney, except renal pelvis: Secondary | ICD-10-CM

## 2018-11-26 NOTE — Progress Notes (Signed)
Hematology and Oncology Follow Up Visit  Keith [name] [id number] [birth year] 52 y.o. 11/26/2018 9:31 AM  [name], MD  [name], M.D.  [name], MD  [name], MD    Principle Diagnosis: 52 year old man with stage IV renal cell carcinoma diagnosed in 2009 with lung and bone metastasis.    Prior Therapy: 1. Status post laparoscopic radical nephrectomy.  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  2. Patient status post thoracotomy for a synchronous metastatic lung lesions done October 2009.   3. Patient is status post stereotactic radiotherapy to pulmonary nodules in May of 2010. 4. He is S/P Sutent 50 mg 4 weeks on 2 weeks off from 10/2010 to 03/2013. He progressed at that time.  5. He is S/P radiation to the right sacral bone between 4/22 to 4/30.  6. He is S/P XRT to the left shoulder 03/20/14 to 03/31/14. 7. Votrient 800 mg by mouth daily from 03/2013 through 06/22/2015. Discontinued secondary to disease progression. 8. Nivolumab 3 mg/kg given every 2 weeks started on 06/29/2015. He is status post 4 cycles completed 08/10/2015. He developed disease progression in September 2016.  9. Status post radiation therapy to the left mid fibula completed on 11/14/2015. He received a grade 1 fraction. 10. He is status post radiation to the proximal and distal femur over 2 weeks and 10 fractions of total of 30 Gy.  This was completed in March 2019. 11.   Radiation therapy to the right tibia.  Last treatment completed in September 2019.    Current therapy:    Cabometyx 60 mg daily started in November 2016.     Interim History:  Keith [name] is here for a follow-up visit.  Since her last visit, he reports no major changes in his health.  He continues to tolerate Cabometyx without any recent complaints.  He denies any worsening diarrhea or abdominal complaints.  His performance status and quality of life remains intact.  He is eating well and has  not had any worsening bone pain.  He does report periodic anxiety that has been managed in the past with Cymbalta.  He denies any recent hospitalization or illnesses.  He denies any bleeding issues associated with Xarelto.  He did not report any headaches blurred vision or double vision.  He denies any confusion or lethargy.  He denies any fevers or chills or sweats.  He does not report any chest pain, palpitation, orthopnea.  He does not report any cough, wheezing or hemoptysis.  He denies any nausea, vomiting or early satiety.  Denies any changes in bowel habits.  He denies any frequency urgency or hematuria. He does not report any skin lesion or ecchymosis. He does not report any ecchymosis or petechiae.  He denies any bruising or clotting tendency.  Denies any depression.  Remainder of his review of system is negative.   Medications: I have reviewed the patient's current medications.  Current Outpatient Medications  Medication Sig Dispense Refill  . cabozantinib (CABOMETYX) 60 MG tablet Take 1 tablet (60 mg total) by mouth daily. With at least 8 ounces of water on an empty stomach. Do not eat for 2 hours before or 1 hour after. Swallow whole. 30 tablet 0  . calcium carbonate (TUMS - DOSED IN MG ELEMENTAL CALCIUM) 500 MG chewable tablet Chew 1 tablet by mouth as needed for indigestion or heartburn.    . diazepam (VALIUM) 5 MG tablet Take 1 tablet (5 mg  total) by mouth every 6 (six) hours as needed. for anxiety 90 tablet 0  . diphenoxylate-atropine (LOMOTIL) 2.5-0.025 MG tablet Take 1 tablet by mouth 4 (four) times daily as needed for diarrhea or loose stools. 60 tablet 0  . DULoxetine (CYMBALTA) 60 MG capsule TAKE 1 CAPSULE (60 MG TOTAL) BY MOUTH 2 (TWO) TIMES DAILY. 180 capsule 0  . ferrous sulfate 325 (65 FE) MG tablet Take 1 tablet (325 mg total) by mouth 3 (three) times daily after meals.  3  . furosemide (LASIX) 20 MG tablet Take 1 tablet (20 mg total) by mouth daily as needed. 30 tablet 0  .  gabapentin (NEURONTIN) 300 MG capsule TAKE 1 CAPSULE BY MOUTH THREE TIMES A DAY 270 capsule 0  . HYDROmorphone (DILAUDID) 8 MG tablet Take 1 tablet (8 mg total) by mouth every 4 (four) hours as needed for severe pain. 30 tablet 0  . rivaroxaban (XARELTO) 20 MG TABS tablet Take 1 tablet (20 mg total) by mouth daily with supper. 90 tablet 3  . tiZANidine (ZANAFLEX) 4 MG tablet Take 1 tablet (4 mg total) by mouth every 8 (eight) hours as needed for muscle spasms. 30 tablet 0   No current facility-administered medications for this visit.      Allergies:  Allergies  Allergen Reactions  . Ceftriaxone Hives  . Hydrocodone Swelling    Past Medical History, Surgical history, Social history, and Family History were reviewed and updated.   Physical Exam:  Blood pressure (!) 135/94, pulse 82, temperature 97.7 F (36.5 C), temperature source Oral, resp. rate 18, height 6\' 3"  (1.905 m), weight 187 lb 8 oz (85 kg), SpO2 100 %.   ECOG: 1   General appearance: Alert, awake without any distress. Head: Atraumatic without abnormalities Oropharynx: Without any thrush or ulcers. Eyes: No scleral icterus. Lymph nodes: No lymphadenopathy noted in the cervical, supraclavicular, or axillary nodes Heart:regular rate and rhythm, without any murmurs or gallops.   Lung: Clear to auscultation without any rhonchi, wheezes or dullness to percussion. Abdomin: Soft, nontender without any shifting dullness or ascites. Musculoskeletal: No clubbing or cyanosis. Neurological: No motor or sensory deficits. Skin: No rashes or lesions.  .   .  Lab Results: Lab Results  Component Value Date   WBC 5.3 11/24/2018   HGB 12.6 (L) 11/24/2018   HCT 41.1 11/24/2018   MCV 102.0 (H) 11/24/2018   PLT 108 (L) 11/24/2018    EXAM: CT CHEST WITH CONTRAST  CT ABDOMEN AND PELVIS WITH AND WITHOUT CONTRAST  TECHNIQUE: Multidetector CT imaging of the chest was performed during intravenous contrast administration.  Multidetector CT imaging of the abdomen and pelvis was performed following the standard protocol before and during bolus administration of intravenous contrast.  CONTRAST:  158mL OMNIPAQUE IOHEXOL 300 MG/ML  SOLN  COMPARISON:  Multiple exams, including 07/07/2017  FINDINGS: CT CHEST FINDINGS  Cardiovascular: Ascending aortic aneurysm at 4.0 cm in diameter.  Mediastinum/Nodes: Left hilar lymph node 1.3 cm in short axis on image 54/11, stable.  Lungs/Pleura: A primarily right middle lobe nodule possibly spanning into part of the right upper lobe measures proximally 3.3 by 2.2 cm and by my measurements was previously 3.4 by 2.1 cm, essentially stable. Pre similar morphology and overall distribution compared to the prior exam.  A separate suprahilar right upper lobe nodule measures 3.5 by 1.5 cm on image 61/15, previously same by my measurements.  There is some new clustered nodularity anteriorly in the right upper lobe on image 68/15 which  is mostly sub solid and likely inflammatory, but which merits surveillance.  Scarring at the right lung base. Postoperative findings at the right lung base.  Stable 1.7 by 1.6 cm sub solid nodule in the left lower lobe on image 95/5.  New indistinctly marginated 4 mm sub solid nodule in the left upper lobe on image 82/15.  Paraseptal and centrilobular emphysema.  Stable left basilar pleural thickening versus loculated effusion, 1 cm in thickness on image 83/11, with adjacent nodularity or scarring.  Musculoskeletal: Old right posterior fourth rib deformity related to metastatic lesion. An epidural catheter is noted extending into the upper thoracic region. Thoracic spondylosis.  CT ABDOMEN AND PELVIS FINDINGS  Hepatobiliary: The peripheral subcapsular arterial phase enhancing nodule in the right hepatic lobe measures 1.1 by 0.6 and by my measurement was previously the same. This lesion was visible on 07/28/2017 and  remains somewhat nonspecific. The lesion is poorly seen on delayed images and may be a small vascular malformation.  Pancreas: A mass of the pancreatic body measures 2.8 by 1.9 cm on image 109/11, stable. There is dilation of the dorsal pancreatic duct distal to the level of this mass. The mass along the anterior margin of the pancreatic head measures 3.1 by 3.2 cm on image 118/11, formerly 3.0 by 3.2 cm, stable. A third, small tumor nodule measuring 0.8 cm in diameter is present along the anterior margin of the pancreatic tail on image 107/11, stable.  Spleen: Unremarkable  Adrenals/Urinary Tract: The adrenal glands appear unremarkable. Adjacent small left sided lymph nodes are present near the adrenal gland, unchanged. Left nephrectomy noted. No urinary tract calculi. No right renal mass.  Stomach/Bowel: Unremarkable  Vascular/Lymphatic: Aortoiliac atherosclerotic vascular disease.  Reproductive: Unremarkable  Other: No supplemental non-categorized findings.  Musculoskeletal: Sclerosis surrounding a lucent right sacral lesion with the region of lucency measuring 2.6 by 3.0 cm on image 182/11, formerly the same.  Right hip screw observed with a lytic lesion along the anterior margin of the hip screw with cortical discontinuity on image 230/11, not appreciably changed from prior.  Oval-shaped lytic lesion with sclerotic margins in the right inferior pubic ramus, 1.2 by 2.1 cm on image 223/11, stable. No new bony lesions identified.  IMPRESSION: 1. Overall stable pulmonary nodules, pancreatic masses, and osseous metastatic lesions compared to 07/01/2018. 2. Stable subcapsular arterial phase enhancing nodule posteriorly in the right hepatic lobe, this could well be a benign vascular malformation but is technically nonspecific. Surveillance suggested. 3. Ascending aortic aneurysm, 4.0 cm in diameter. Recommend annual imaging followup by CTA or MRA. This  recommendation follows 2010 ACCF/AHA/AATS/ACR/ASA/SCA/SCAI/SIR/STS/SVM Guidelines for the Diagnosis and Management of Patients with Thoracic Aortic Disease. Circulation. 2010; 121: J188-C166 4. New clustered sub solid nodularity anteriorly in the right upper lobe on image 68/15, and new 4 mm sub solid nodule in the left upper lobe on image 82/15, probably postinflammatory, surveillance suggested. 5. Other imaging findings of potential clinical significance: Aortic Atherosclerosis (ICD10-I70.0) and Emphysema (ICD10-J43.9).    Impression and Plan:  52 year old man with  1.  Renal cell carcinoma with documented metastatic disease to the lung and pancreas dating back to 2009.      He continues to tolerate Cabometyx without any major complications.  CT scan obtained on 11/24/2018 was personally reviewed and showed stable disease.  Risks and benefits and long-term complication associated with this therapy was discussed again today.  He is agreeable to continue.  Alternative therapies were reviewed today and at this time I see no reason  to switch his therapy.  2.  Pulmonary embolism: He is currently on Xarelto without any bleeding or thrombosis issues.  I recommended continuing this indefinitely.  3.  Anemia: His hemoglobin is close to normal range at this time.  4.  Right tibial metastasis: After radiation his pain has been managed at this time.  5.  Diarrhea: I continue to educate him about the use of Imodium and the importance of controlling his diarrhea.  So far it is manageable.  6.  Anxiety: He reports Cymbalta has been successful and treating his anxiety although has been less successful as of late.  Will consider switching to a different antidepressant.  7.  Prognosis: His disease is incurable but has been managed and controlled for extended period of time.  Aggressive therapy is warranted.  8.  Follow-up: We will be in January 2020 for a follow-up.  25  minutes was spent with  the patient face-to-face today.  More than 50% of time was dedicated to discussing his disease status, treatment options and reviewing imaging studies.     Zola Button, MD 11/26/18

## 2018-11-26 NOTE — Telephone Encounter (Signed)
Printed avs and calender of upcoming appointment. Per 12/6 los 

## 2018-12-01 ENCOUNTER — Other Ambulatory Visit: Payer: Self-pay

## 2018-12-01 ENCOUNTER — Other Ambulatory Visit: Payer: Self-pay | Admitting: Oncology

## 2018-12-01 DIAGNOSIS — C7800 Secondary malignant neoplasm of unspecified lung: Secondary | ICD-10-CM

## 2018-12-01 DIAGNOSIS — C649 Malignant neoplasm of unspecified kidney, except renal pelvis: Secondary | ICD-10-CM

## 2018-12-01 DIAGNOSIS — C7951 Secondary malignant neoplasm of bone: Secondary | ICD-10-CM

## 2018-12-01 DIAGNOSIS — K8689 Other specified diseases of pancreas: Secondary | ICD-10-CM

## 2018-12-01 MED ORDER — CABOZANTINIB S-MALATE 60 MG PO TABS: 60.0000 mg | ORAL_TABLET | Freq: Every day | ORAL | 0 refills | Status: DC

## 2018-12-13 DIAGNOSIS — G620 Drug-induced polyneuropathy: Secondary | ICD-10-CM | POA: Diagnosis not present

## 2018-12-13 DIAGNOSIS — M25551 Pain in right hip: Secondary | ICD-10-CM | POA: Diagnosis not present

## 2018-12-13 DIAGNOSIS — Z9689 Presence of other specified functional implants: Secondary | ICD-10-CM | POA: Diagnosis not present

## 2018-12-13 DIAGNOSIS — R030 Elevated blood-pressure reading, without diagnosis of hypertension: Secondary | ICD-10-CM | POA: Diagnosis not present

## 2018-12-25 ENCOUNTER — Other Ambulatory Visit: Payer: Self-pay | Admitting: Oncology

## 2019-01-03 ENCOUNTER — Other Ambulatory Visit: Payer: Self-pay

## 2019-01-03 DIAGNOSIS — G620 Drug-induced polyneuropathy: Secondary | ICD-10-CM | POA: Diagnosis not present

## 2019-01-03 DIAGNOSIS — C7800 Secondary malignant neoplasm of unspecified lung: Secondary | ICD-10-CM

## 2019-01-03 DIAGNOSIS — C7951 Secondary malignant neoplasm of bone: Secondary | ICD-10-CM

## 2019-01-03 DIAGNOSIS — C649 Malignant neoplasm of unspecified kidney, except renal pelvis: Secondary | ICD-10-CM

## 2019-01-03 DIAGNOSIS — K8689 Other specified diseases of pancreas: Secondary | ICD-10-CM

## 2019-01-03 MED ORDER — CABOZANTINIB S-MALATE 60 MG PO TABS: 60.0000 mg | ORAL_TABLET | Freq: Every day | ORAL | 0 refills | Status: DC

## 2019-01-12 ENCOUNTER — Inpatient Hospital Stay: Payer: Medicare Other

## 2019-01-12 ENCOUNTER — Inpatient Hospital Stay: Payer: Medicare Other | Attending: Oncology | Admitting: Oncology

## 2019-01-12 VITALS — BP 164/97 | HR 80 | Temp 97.9°F | Resp 18 | Ht 75.0 in | Wt 182.9 lb

## 2019-01-12 DIAGNOSIS — Z79899 Other long term (current) drug therapy: Secondary | ICD-10-CM

## 2019-01-12 DIAGNOSIS — Z923 Personal history of irradiation: Secondary | ICD-10-CM

## 2019-01-12 DIAGNOSIS — R197 Diarrhea, unspecified: Secondary | ICD-10-CM | POA: Insufficient documentation

## 2019-01-12 DIAGNOSIS — Z7901 Long term (current) use of anticoagulants: Secondary | ICD-10-CM | POA: Insufficient documentation

## 2019-01-12 DIAGNOSIS — C649 Malignant neoplasm of unspecified kidney, except renal pelvis: Secondary | ICD-10-CM

## 2019-01-12 DIAGNOSIS — Z86711 Personal history of pulmonary embolism: Secondary | ICD-10-CM | POA: Diagnosis not present

## 2019-01-12 DIAGNOSIS — Z905 Acquired absence of kidney: Secondary | ICD-10-CM | POA: Diagnosis not present

## 2019-01-12 DIAGNOSIS — C7951 Secondary malignant neoplasm of bone: Secondary | ICD-10-CM | POA: Diagnosis not present

## 2019-01-12 DIAGNOSIS — D649 Anemia, unspecified: Secondary | ICD-10-CM | POA: Insufficient documentation

## 2019-01-12 DIAGNOSIS — F419 Anxiety disorder, unspecified: Secondary | ICD-10-CM | POA: Diagnosis not present

## 2019-01-12 DIAGNOSIS — C7800 Secondary malignant neoplasm of unspecified lung: Secondary | ICD-10-CM

## 2019-01-12 LAB — CMP (CANCER CENTER ONLY)
ALT: 18 U/L (ref 0–44)
AST: 16 U/L (ref 15–41)
Albumin: 2.8 g/dL — ABNORMAL LOW (ref 3.5–5.0)
Alkaline Phosphatase: 75 U/L (ref 38–126)
Anion gap: 7 (ref 5–15)
BUN: 12 mg/dL (ref 6–20)
CO2: 28 mmol/L (ref 22–32)
Calcium: 8.4 mg/dL — ABNORMAL LOW (ref 8.9–10.3)
Chloride: 106 mmol/L (ref 98–111)
Creatinine: 0.87 mg/dL (ref 0.61–1.24)
GFR, Est AFR Am: >60 mL/min (ref >60)
GFR, Estimated: >60 mL/min (ref >60)
Glucose, Bld: 108 mg/dL — ABNORMAL HIGH (ref 70–99)
Potassium: 4.3 mmol/L (ref 3.5–5.1)
Sodium: 141 mmol/L (ref 135–145)
Total Bilirubin: 0.2 mg/dL — ABNORMAL LOW (ref 0.3–1.2)
Total Protein: 6.1 g/dL — ABNORMAL LOW (ref 6.5–8.1)

## 2019-01-12 LAB — CBC WITH DIFFERENTIAL (CANCER CENTER ONLY)
Abs Immature Granulocytes: 0.02 10*3/uL (ref 0.00–0.07)
Basophils Absolute: 0.1 10*3/uL (ref 0.0–0.1)
Basophils Relative: 1 %
Eosinophils Absolute: 0.1 10*3/uL (ref 0.0–0.5)
Eosinophils Relative: 1 %
HCT: 34.2 % — ABNORMAL LOW (ref 39.0–52.0)
Hemoglobin: 10.2 g/dL — ABNORMAL LOW (ref 13.0–17.0)
Immature Granulocytes: 0 %
Lymphocytes Relative: 22 %
Lymphs Abs: 1.5 10*3/uL (ref 0.7–4.0)
MCH: 31.6 pg (ref 26.0–34.0)
MCHC: 29.8 g/dL — ABNORMAL LOW (ref 30.0–36.0)
MCV: 105.9 fL — ABNORMAL HIGH (ref 80.0–100.0)
Monocytes Absolute: 0.5 10*3/uL (ref 0.1–1.0)
Monocytes Relative: 8 %
Neutro Abs: 4.7 10*3/uL (ref 1.7–7.7)
Neutrophils Relative %: 68 %
Platelet Count: 156 10*3/uL (ref 150–400)
RBC: 3.23 MIL/uL — ABNORMAL LOW (ref 4.22–5.81)
RDW: 17.6 % — ABNORMAL HIGH (ref 11.5–15.5)
WBC Count: 6.9 10*3/uL (ref 4.0–10.5)
nRBC: 0 % (ref 0.0–0.2)

## 2019-01-12 IMAGING — CT CT CHEST W/ CM
2 of 5 series · 13 of 36 positions shown, 16 images · IV contrast (ISOVUE 300)
Comparison: AP CT on 08/31/2016 and chest CT on 06/17/2016

CLINICAL DATA: Followup metastatic renal cell carcinoma. Ongoing
chemotherapy. Previous radiation therapy.

EXAM:
CT CHEST, ABDOMEN, AND PELVIS WITH CONTRAST
TECHNIQUE: Multidetector CT imaging of the chest, abdomen and pelvis was
performed following the standard protocol during bolus
administration of intravenous contrast.
CONTRAST:  100mL [id number] IOPAMIDOL (AWYM7S-366) INJECTION 61%

[Series 2: cap with · axial · 0.86mm/px · z∈[+1013,+1578]mm · 10 of 139 slices shown, 13 images]
[im 13/139  mediastinal]
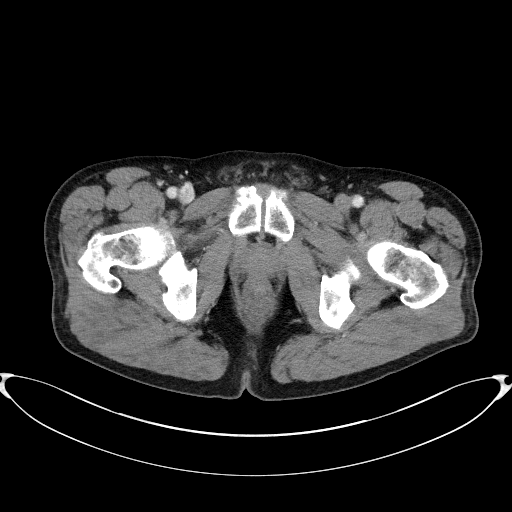
[im 13/139  lung]
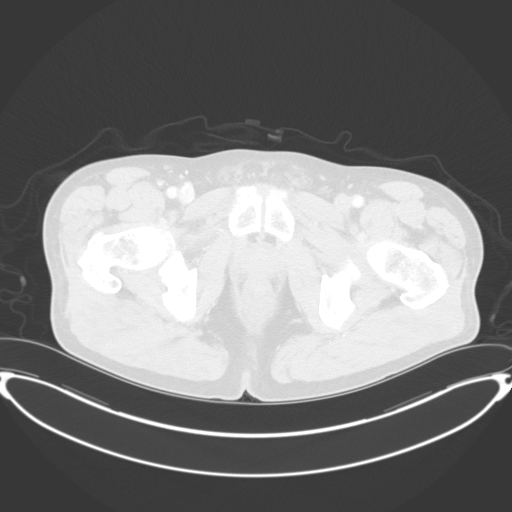
[im 26/139  lung]
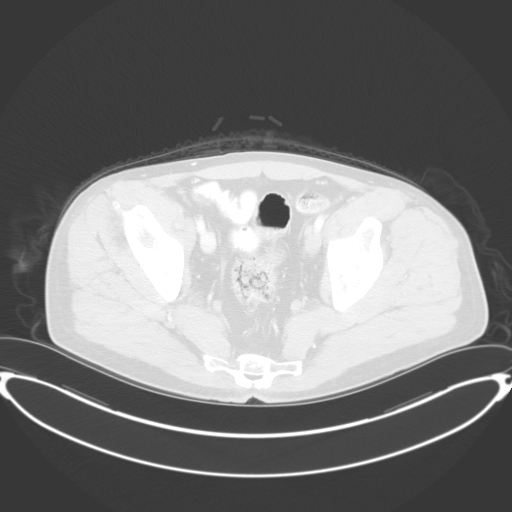
[im 38/139  lung]
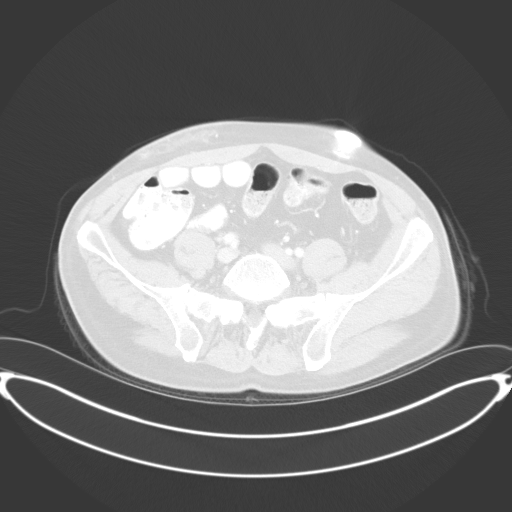
[im 51/139  lung]
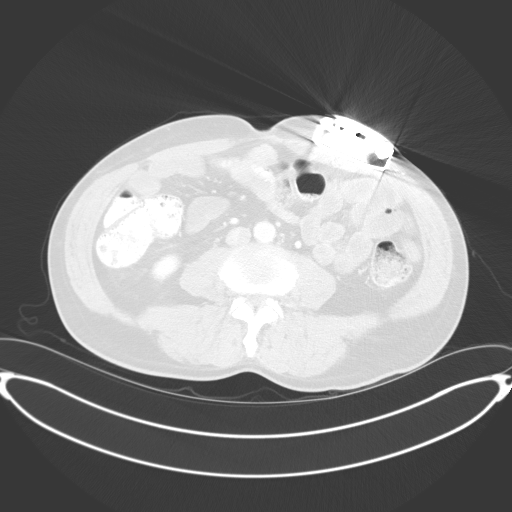
[im 63/139  mediastinal]
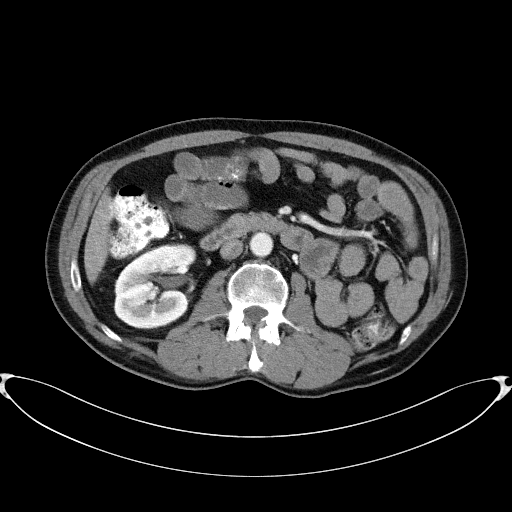
[im 63/139  lung]
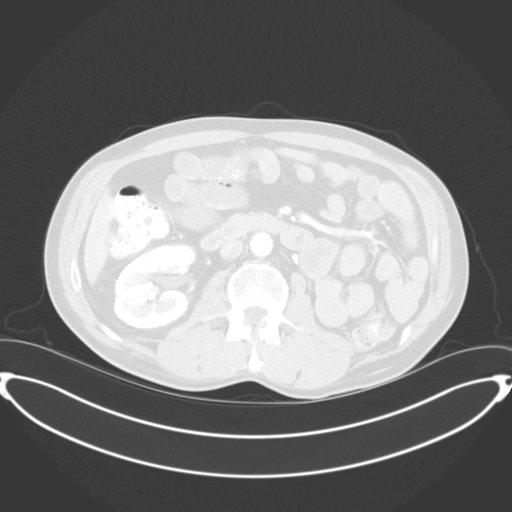
[im 76/139  lung]
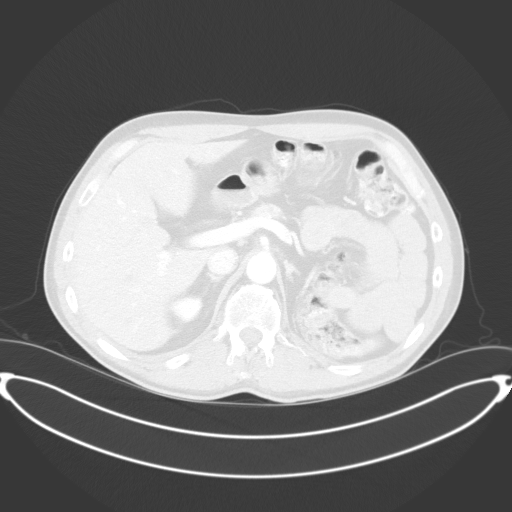
[im 88/139  lung]
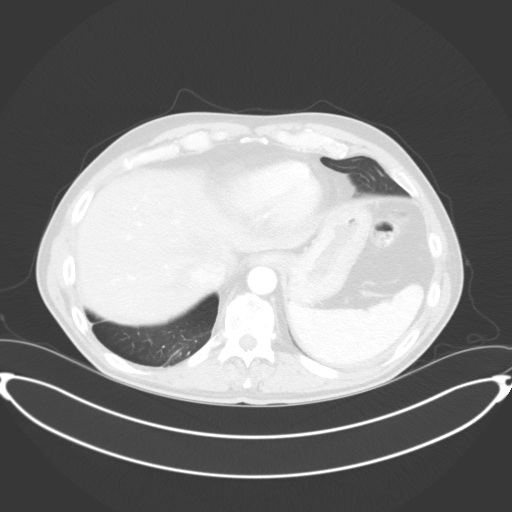
[im 101/139  lung]
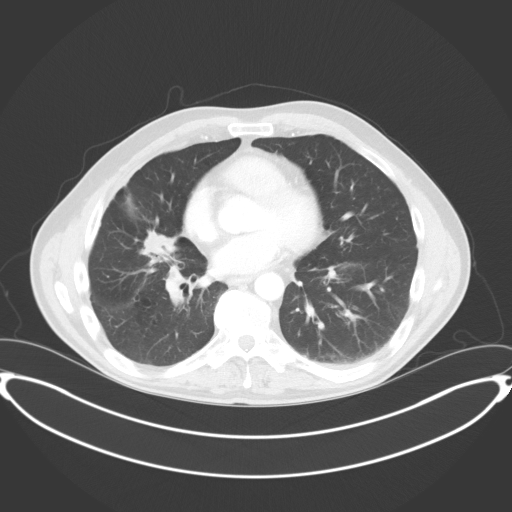
[im 113/139  mediastinal]
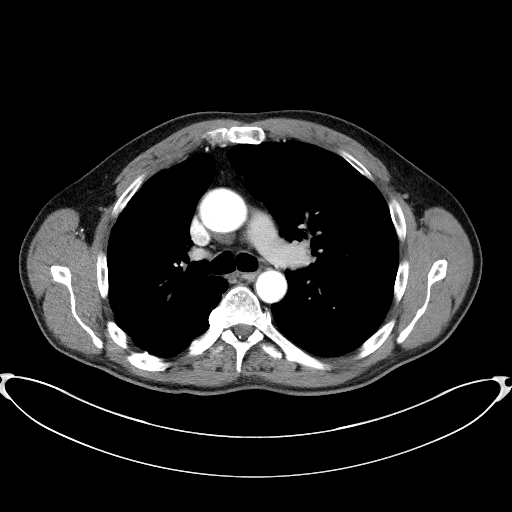
[im 113/139  lung]
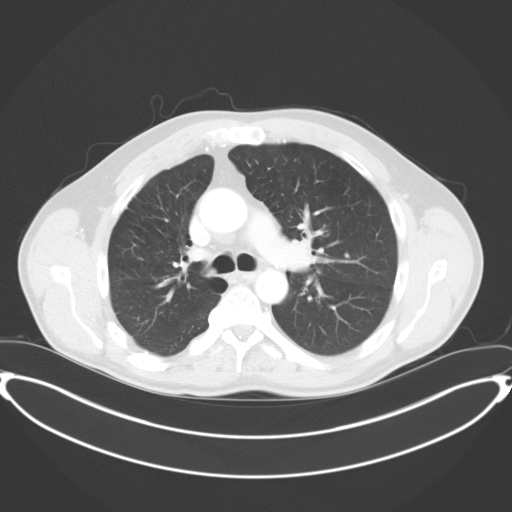
[im 126/139  lung]
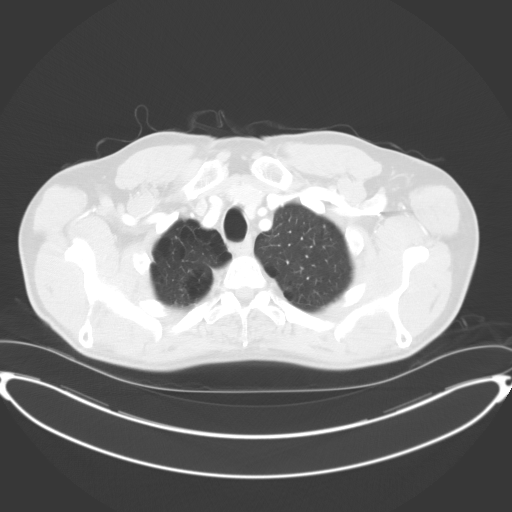

[Series 4: coronals · coronal · 1.02mm/px · 3 of 137 slices shown]
[im 28/137  lung]
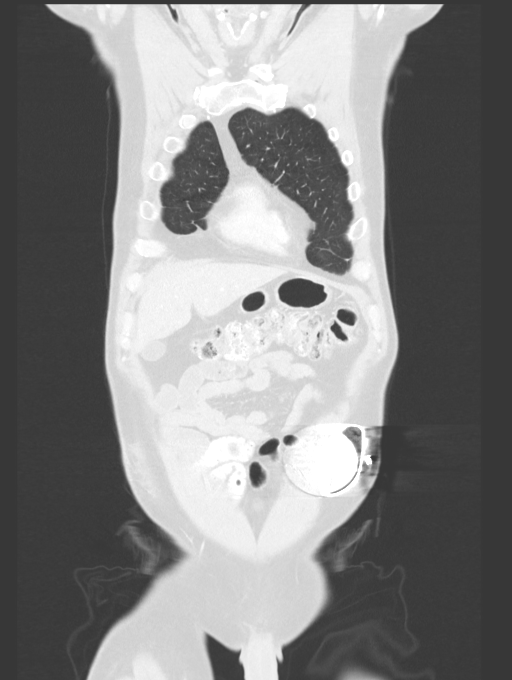
[im 55/137  lung]
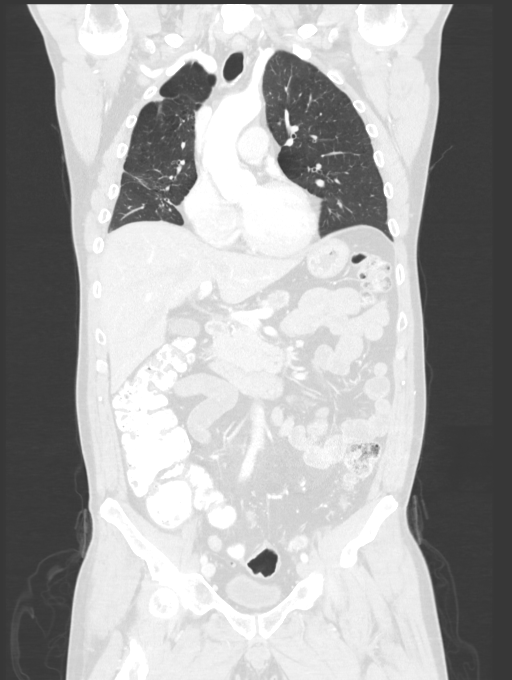
[im 82/137  lung]
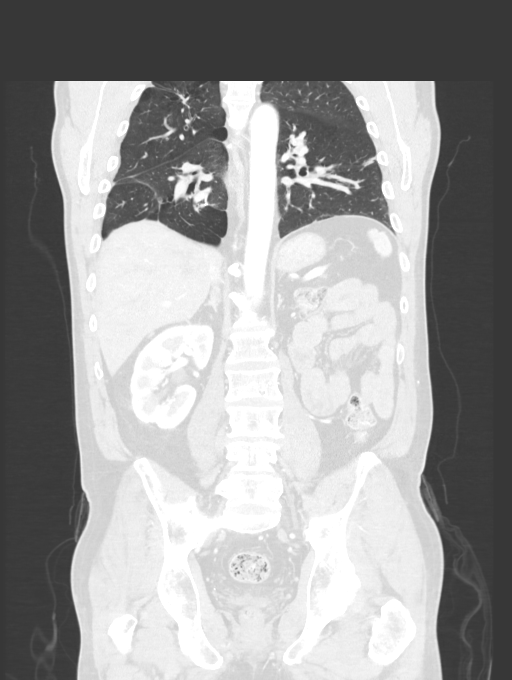

[13 of 36 positions shown; findings below may reference images not displayed]

FINDINGS: CT CHEST FINDINGS

Cardiovascular: No acute findings. Stable 4.0 cm ascending thoracic
aortic aneurysm.

Mediastinum/Lymph Nodes: No masses or pathologically enlarged lymph
nodes identified.

Lungs/Pleura: Mild emphysema. Stable 14 mm ill-defined nodular
opacity central right upper lobe, with bandlike density extending to
the posterior pleural surface. Stable 2.2 cm nodular opacity in the
central right middle lobe. Left lower lobe pulmonary nodular
opacities are also stable, largest measuring 1.3 x 2.6 cm. No new or
enlarging pulmonary nodules or masses are identified. Mild left
pleural thickening and bilateral lower lung scarring again noted.

Musculoskeletal:  No suspicious bone lesions identified.

CT ABDOMEN AND PELVIS FINDINGS

Hepatobiliary: 10 mm hypervascular subcapsular lesion in the
posterior right hepatic lobe on image 60/2 remains stable. No other
liver lesions identified. Gallbladder is unremarkable.

Pancreas: Atrophy and ductal dilatation in the pancreatic tail again
seen. A hypervascular mass in pancreatic body measures 1.3 x 1.8 cm
on image 63/2 compared to 0.9 x 1.4 cm previously. Heterogeneously
enhancing mass involving the pancreatic neck measures 2.5 x 2.8 cm
on image 68/ 2 compared to 2.4 x 2.9 cm previously.

Spleen:  Within normal limits in size and appearance.

Adrenals/Urinary tract: Normal appearance of both adrenal glands.
Previous left nephrectomy. Normal appearance of right kidney.
Unopacified urinary bladder is unremarkable in appearance.

Stomach/Bowel: No evidence of obstruction, inflammatory process, or
abnormal fluid collections.

Vascular/Lymphatic: No pathologically enlarged lymph nodes
identified. No abdominal aortic aneurysm. Aortic atherosclerosis.

Reproductive:  No mass or other significant abnormality identified.

Other:  None.

Musculoskeletal: Lytic bone lesions involving the right sacrum,
right inferior pubic ramus, right hip, and right posterior fourth
rib are stable. A lytic bone lesion is also seen in the proximal
left humerus which was not included in the field of view on previous
exams. No definite new or enlarging bone lesions identified.
IMPRESSION: Stable bilateral pulmonary nodular opacities and mild left pleural
thickening.

Mildly increased size of 1.8cm hypervascular metastasis in the
pancreatic body. Stable metastasis involving the pancreatic neck.

Stable lytic bone metastases.

Stable hypervascular lesion in posterior right hepatic lobe.

Stable 4.0 cm ascending thoracic aortic aneurysm. Recommend annual
imaging followup by CTA or MRA. This recommendation follows 7848
ACCF/AHA/AATS/ACR/ASA/SCA/LELY/CEKLINSKI/ELENIYAN/SCHWINDEN Guidelines for the
Diagnosis and Management of Patients with Thoracic Aortic Disease.
Circulation. 7848; 121: [id number]

## 2019-01-12 MED ORDER — ALPRAZOLAM 0.5 MG PO TABS: 0.5000 mg | ORAL_TABLET | Freq: Three times a day (TID) | ORAL | 0 refills | Status: DC | PRN

## 2019-01-12 NOTE — Progress Notes (Signed)
Hematology and Oncology Follow Up Visit  Keith [name] [id number] [birth year] 53 y.o. 01/12/2019 12:46 PM  [name], MD  [name], M.D.  [name], MD  [name], MD    Principle Diagnosis: 53 year old man with renal cell carcinoma diagnosed in 2009.  He presented with stage IV disease and pulmonary metastasis.    Prior Therapy: 1. Status post laparoscopic radical nephrectomy.  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  2. Patient status post thoracotomy for a synchronous metastatic lung lesions done October 2009.   3. Patient is status post stereotactic radiotherapy to pulmonary nodules in May of 2010. 4. He is S/P Sutent 50 mg 4 weeks on 2 weeks off from 10/2010 to 03/2013. He progressed at that time.  5. He is S/P radiation to the right sacral bone between 4/22 to 4/30.  6. He is S/P XRT to the left shoulder 03/20/14 to 03/31/14. 7. Votrient 800 mg by mouth daily from 03/2013 through 06/22/2015. Discontinued secondary to disease progression. 8. Nivolumab 3 mg/kg given every 2 weeks started on 06/29/2015. He is status post 4 cycles completed 08/10/2015. He developed disease progression in September 2016.  9. Status post radiation therapy to the left mid fibula completed on 11/14/2015. He received a grade 1 fraction. 10. He is status post radiation to the proximal and distal femur over 2 weeks and 10 fractions of total of 30 Gy.  This was completed in March 2019. 11.   Radiation therapy to the right tibia.  Last treatment completed in September 2019.    Current therapy:    Cabometyx 60 mg daily started in November 2016.     Interim History:  Keith [name] returns today for a repeat evaluation.  Since the last visit, he developed the flu and was sick for close to 2 weeks.  During that time he had symptoms of fevers and body aches and anorexia.  He lost close to 4 pounds.  He is fully recovered at this time and he resumed most activities  of daily living.  He denied hospitalization or need for intravenous antibiotics.  His performance status activity level is back to normal.  He continues to take Cabometyx 60 mg daily without any complications.  He does report some mild diarrhea no anorexia or abdominal discomfort.  He has been using Valium periodically for anxiety although feels Xanax has helped his symptoms better.  He has bone pain has been under reasonable control at this time.  Continues to follow with pain clinic regarding this issue.  He did not report any headaches blurred vision or double vision.  He denies any alteration mental status or lethargy.  He denies any fevers or chills or sweats.  He does not report any chest pain, palpitation, orthopnea.  He does not report any cough, wheezing or hemoptysis.  He denies any nausea, vomiting or abdominal pain.  Denies any constipation, hematochezia or melena.  He denies any frequency urgency or hematuria. He does not report any skin irritation or rash. He does not report any bleeding or easy bruising.  His mood has been relatively stable.  Remainder of his review of system is negative.   Medications: I have reviewed the patient's current medications.  Current Outpatient Medications  Medication Sig Dispense Refill  . cabozantinib (CABOMETYX) 60 MG tablet Take 1 tablet (60 mg total) by mouth daily. With at least 8 ounces of water on an empty stomach. Do not eat for 2  hours before or 1 hour after. Swallow whole. 30 tablet 0  . calcium carbonate (TUMS - DOSED IN MG ELEMENTAL CALCIUM) 500 MG chewable tablet Chew 1 tablet by mouth as needed for indigestion or heartburn.    . diazepam (VALIUM) 5 MG tablet Take 1 tablet (5 mg total) by mouth every 6 (six) hours as needed. for anxiety 90 tablet 0  . diphenoxylate-atropine (LOMOTIL) 2.5-0.025 MG tablet Take 1 tablet by mouth 4 (four) times daily as needed for diarrhea or loose stools. 60 tablet 0  . DULoxetine (CYMBALTA) 60 MG capsule TAKE 1  CAPSULE (60 MG TOTAL) BY MOUTH 2 (TWO) TIMES DAILY. 180 capsule 0  . ferrous sulfate 325 (65 FE) MG tablet Take 1 tablet (325 mg total) by mouth 3 (three) times daily after meals.  3  . furosemide (LASIX) 20 MG tablet Take 1 tablet (20 mg total) by mouth daily as needed. 30 tablet 0  . furosemide (LASIX) 20 MG tablet TAKE 1 TABLET BY MOUTH EVERY DAY AS NEEDED 30 tablet 0  . gabapentin (NEURONTIN) 300 MG capsule TAKE 1 CAPSULE BY MOUTH THREE TIMES A DAY 270 capsule 0  . HYDROmorphone (DILAUDID) 8 MG tablet Take 1 tablet (8 mg total) by mouth every 4 (four) hours as needed for severe pain. 30 tablet 0  . rivaroxaban (XARELTO) 20 MG TABS tablet Take 1 tablet (20 mg total) by mouth daily with supper. 90 tablet 3  . tiZANidine (ZANAFLEX) 4 MG tablet Take 1 tablet (4 mg total) by mouth every 8 (eight) hours as needed for muscle spasms. 30 tablet 0   No current facility-administered medications for this visit.      Allergies:  Allergies  Allergen Reactions  . Ceftriaxone Hives  . Hydrocodone Swelling    Past Medical History, Surgical history, Social history, and Family History were reviewed and updated.   Physical Exam:  Blood pressure (!) 164/97, pulse 80, temperature 97.9 F (36.6 C), temperature source Oral, resp. rate 18, height 6\' 3"  (1.905 m), weight 182 lb 14.4 oz (83 kg), SpO2 100 %.    ECOG: 1   General appearance: Comfortable appearing without any discomfort Head: Normocephalic without any trauma Oropharynx: Mucous membranes are moist and pink without any thrush or ulcers. Eyes: Pupils are equal and round reactive to light. Lymph nodes: No cervical, supraclavicular, inguinal or axillary lymphadenopathy.   Heart:regular rate and rhythm.  S1 and S2 without leg edema. Lung: Clear without any rhonchi or wheezes.  No dullness to percussion. Abdomin: Soft, nontender, nondistended with good bowel sounds.  No hepatosplenomegaly. Musculoskeletal: No joint deformity or effusion.   Full range of motion noted. Neurological: No deficits noted on motor, sensory and deep tendon reflex exam. Skin: No petechial rash or dryness.  Appeared moist.    .   .  Lab Results: Lab Results  Component Value Date   WBC 5.3 11/24/2018   HGB 12.6 (L) 11/24/2018   HCT 41.1 11/24/2018   MCV 102.0 (H) 11/24/2018   PLT 108 (L) 11/24/2018     Impression and Plan:  53 year old man with  1.  Stage IV renal cell carcinoma with documented metastatic disease to the lung, the bone among other areas.      He mains on Cabometyx without any new side effects.  Imaging studies in December 2019 showed no evidence of disease progression.  Risks and benefits of continuing this therapy long-term was discussed and is agreeable to continue.  Alternative therapies were reiterated to include different  oral targeted therapy versus immune therapy.   2.  Pulmonary embolism: No recent exacerbation.  He continues to be on Xarelto which will be continued indefinitely.  3.  Anemia: Hemoglobin relatively stable at this time.  His anemia is related to malignancy.  4.  Diarrhea: Manageable at this time with antidiarrheal medication.  I continue to educate him about the importance of controlling the symptoms.  5.  Anxiety: Controlled with Cymbalta mostly and he will use Xanax for breakthrough anxiety.  I will discontinue Valium and use Xanax as needed.  6.  Prognosis: Therapy remains palliative although his performance status remain excellent and aggressive therapy is warranted..  8.  Follow-up: We will be in 6 weeks to follow his progress.  25  minutes was spent with the patient face-to-face today.  More than 50% of time was dedicated to reviewing the natural course of his disease, future treatment options and answering question regarding future plan of care.     Zola Button, MD 01/12/19

## 2019-01-13 ENCOUNTER — Telehealth: Payer: Self-pay

## 2019-01-13 NOTE — Telephone Encounter (Signed)
Spoke with patient concerning a appointment that was scheduled. Per 1/22 los Mailed a letter with a calender enclosed.

## 2019-01-24 ENCOUNTER — Telehealth: Payer: Self-pay

## 2019-01-24 NOTE — Telephone Encounter (Signed)
Patient stated that he has not heard anything from the pharmacy regarding Cabometyx. Keith [name] and they stated that they received the prescription and will reach out to the patient on 2/6. Contacted patient and made him aware. No other questions or concerns.

## 2019-02-11 DIAGNOSIS — G620 Drug-induced polyneuropathy: Secondary | ICD-10-CM | POA: Diagnosis not present

## 2019-02-15 DIAGNOSIS — C642 Malignant neoplasm of left kidney, except renal pelvis: Secondary | ICD-10-CM | POA: Diagnosis not present

## 2019-02-15 DIAGNOSIS — R030 Elevated blood-pressure reading, without diagnosis of hypertension: Secondary | ICD-10-CM | POA: Diagnosis not present

## 2019-02-15 DIAGNOSIS — G620 Drug-induced polyneuropathy: Secondary | ICD-10-CM | POA: Diagnosis not present

## 2019-02-15 DIAGNOSIS — M25551 Pain in right hip: Secondary | ICD-10-CM | POA: Diagnosis not present

## 2019-02-15 DIAGNOSIS — Z9689 Presence of other specified functional implants: Secondary | ICD-10-CM | POA: Diagnosis not present

## 2019-02-24 ENCOUNTER — Other Ambulatory Visit: Payer: Self-pay

## 2019-02-24 DIAGNOSIS — C7800 Secondary malignant neoplasm of unspecified lung: Secondary | ICD-10-CM

## 2019-02-24 DIAGNOSIS — K8689 Other specified diseases of pancreas: Secondary | ICD-10-CM

## 2019-02-24 DIAGNOSIS — C649 Malignant neoplasm of unspecified kidney, except renal pelvis: Secondary | ICD-10-CM

## 2019-02-24 DIAGNOSIS — C7951 Secondary malignant neoplasm of bone: Secondary | ICD-10-CM

## 2019-02-24 MED ORDER — CABOZANTINIB S-MALATE 60 MG PO TABS: 60.0000 mg | ORAL_TABLET | Freq: Every day | ORAL | 0 refills | Status: DC

## 2019-02-25 ENCOUNTER — Telehealth: Payer: Self-pay | Admitting: Oncology

## 2019-02-25 ENCOUNTER — Inpatient Hospital Stay: Payer: Medicare Other | Attending: Oncology

## 2019-02-25 ENCOUNTER — Inpatient Hospital Stay: Payer: Medicare Other | Admitting: Oncology

## 2019-02-25 NOTE — Telephone Encounter (Signed)
Called pt per 3/6 sch message - unable to reach patient , left message for patient to call back to r/s

## 2019-02-28 ENCOUNTER — Telehealth: Payer: Self-pay | Admitting: Oncology

## 2019-02-28 NOTE — Telephone Encounter (Signed)
Called patient per 3/9 sch message to r/s 3/6 appt - no answer - left message for patient to call back

## 2019-03-08 ENCOUNTER — Ambulatory Visit: Payer: Medicare Other | Admitting: Gastroenterology

## 2019-03-22 ENCOUNTER — Other Ambulatory Visit: Payer: Self-pay

## 2019-03-22 DIAGNOSIS — C7800 Secondary malignant neoplasm of unspecified lung: Secondary | ICD-10-CM

## 2019-03-22 DIAGNOSIS — K8689 Other specified diseases of pancreas: Secondary | ICD-10-CM

## 2019-03-22 DIAGNOSIS — C7951 Secondary malignant neoplasm of bone: Secondary | ICD-10-CM

## 2019-03-22 DIAGNOSIS — C649 Malignant neoplasm of unspecified kidney, except renal pelvis: Secondary | ICD-10-CM

## 2019-03-22 MED ORDER — CABOZANTINIB S-MALATE 60 MG PO TABS: 60.0000 mg | ORAL_TABLET | Freq: Every day | ORAL | 0 refills | Status: DC

## 2019-03-25 ENCOUNTER — Other Ambulatory Visit: Payer: Self-pay

## 2019-03-25 MED ORDER — GABAPENTIN 300 MG PO CAPS: ORAL_CAPSULE | ORAL | 0 refills | Status: DC

## 2019-04-06 ENCOUNTER — Inpatient Hospital Stay: Payer: Medicare Other | Attending: Oncology

## 2019-04-06 ENCOUNTER — Other Ambulatory Visit: Payer: Self-pay

## 2019-04-06 ENCOUNTER — Telehealth: Payer: Self-pay | Admitting: Oncology

## 2019-04-06 ENCOUNTER — Inpatient Hospital Stay (HOSPITAL_BASED_OUTPATIENT_CLINIC_OR_DEPARTMENT_OTHER): Payer: Medicare Other | Admitting: Oncology

## 2019-04-06 VITALS — BP 146/80 | HR 87 | Temp 97.9°F | Resp 18 | Ht 75.0 in | Wt 179.6 lb

## 2019-04-06 DIAGNOSIS — F419 Anxiety disorder, unspecified: Secondary | ICD-10-CM

## 2019-04-06 DIAGNOSIS — Z86711 Personal history of pulmonary embolism: Secondary | ICD-10-CM

## 2019-04-06 DIAGNOSIS — C649 Malignant neoplasm of unspecified kidney, except renal pelvis: Secondary | ICD-10-CM

## 2019-04-06 DIAGNOSIS — Z7901 Long term (current) use of anticoagulants: Secondary | ICD-10-CM | POA: Insufficient documentation

## 2019-04-06 DIAGNOSIS — C7800 Secondary malignant neoplasm of unspecified lung: Secondary | ICD-10-CM | POA: Diagnosis not present

## 2019-04-06 DIAGNOSIS — C7951 Secondary malignant neoplasm of bone: Secondary | ICD-10-CM | POA: Insufficient documentation

## 2019-04-06 DIAGNOSIS — Z923 Personal history of irradiation: Secondary | ICD-10-CM | POA: Diagnosis not present

## 2019-04-06 LAB — CMP (CANCER CENTER ONLY)
ALT: 16 U/L (ref 0–44)
AST: 19 U/L (ref 15–41)
Albumin: 2.6 g/dL — ABNORMAL LOW (ref 3.5–5.0)
Alkaline Phosphatase: 76 U/L (ref 38–126)
Anion gap: 10 (ref 5–15)
BUN: 12 mg/dL (ref 6–20)
CO2: 26 mmol/L (ref 22–32)
Calcium: 8.3 mg/dL — ABNORMAL LOW (ref 8.9–10.3)
Chloride: 104 mmol/L (ref 98–111)
Creatinine: 1.08 mg/dL (ref 0.61–1.24)
GFR, Est AFR Am: >60 mL/min (ref >60)
GFR, Estimated: >60 mL/min (ref >60)
Glucose, Bld: 118 mg/dL — ABNORMAL HIGH (ref 70–99)
Potassium: 3.8 mmol/L (ref 3.5–5.1)
Sodium: 140 mmol/L (ref 135–145)
Total Bilirubin: 0.4 mg/dL (ref 0.3–1.2)
Total Protein: 6.8 g/dL (ref 6.5–8.1)

## 2019-04-06 LAB — CBC WITH DIFFERENTIAL (CANCER CENTER ONLY)
Abs Immature Granulocytes: 0.02 10*3/uL (ref 0.00–0.07)
Basophils Absolute: 0 10*3/uL (ref 0.0–0.1)
Basophils Relative: 1 %
Eosinophils Absolute: 0.1 10*3/uL (ref 0.0–0.5)
Eosinophils Relative: 2 %
HCT: 38.1 % — ABNORMAL LOW (ref 39.0–52.0)
Hemoglobin: 11.4 g/dL — ABNORMAL LOW (ref 13.0–17.0)
Immature Granulocytes: 0 %
Lymphocytes Relative: 14 %
Lymphs Abs: 0.9 10*3/uL (ref 0.7–4.0)
MCH: 30.7 pg (ref 26.0–34.0)
MCHC: 29.9 g/dL — ABNORMAL LOW (ref 30.0–36.0)
MCV: 102.7 fL — ABNORMAL HIGH (ref 80.0–100.0)
Monocytes Absolute: 0.5 10*3/uL (ref 0.1–1.0)
Monocytes Relative: 8 %
Neutro Abs: 4.8 10*3/uL (ref 1.7–7.7)
Neutrophils Relative %: 75 %
Platelet Count: 110 10*3/uL — ABNORMAL LOW (ref 150–400)
RBC: 3.71 MIL/uL — ABNORMAL LOW (ref 4.22–5.81)
RDW: 16.3 % — ABNORMAL HIGH (ref 11.5–15.5)
WBC Count: 6.5 10*3/uL (ref 4.0–10.5)
nRBC: 0 % (ref 0.0–0.2)

## 2019-04-06 MED ORDER — RIVAROXABAN 20 MG PO TABS: 20.0000 mg | ORAL_TABLET | Freq: Every day | ORAL | 3 refills | Status: DC

## 2019-04-06 MED ORDER — DULOXETINE HCL 60 MG PO CPEP: 60.0000 mg | ORAL_CAPSULE | Freq: Two times a day (BID) | ORAL | 0 refills | Status: DC

## 2019-04-06 MED ORDER — ALPRAZOLAM 1 MG PO TABS: 1.0000 mg | ORAL_TABLET | Freq: Three times a day (TID) | ORAL | 0 refills | Status: DC | PRN

## 2019-04-06 NOTE — Progress Notes (Signed)
Hematology and Oncology Follow Up Visit  Keith [name] [id number] 07-19-1966 53 y.o. 04/06/2019 8:46 AM  [name], MD  [name], M.D.  [name], MD  [name], MD    Principle Diagnosis: 53 year old man with stage IV renal cell carcinoma with no nonpulmonary and bone metastasis diagnosed in 2009.      Prior Therapy: 1. Status post laparoscopic radical nephrectomy.  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  2. Patient status post thoracotomy for a synchronous metastatic lung lesions done October 2009.   3. Patient is status post stereotactic radiotherapy to pulmonary nodules in May of 2010. 4. He is S/P Sutent 50 mg 4 weeks on 2 weeks off from 10/2010 to 03/2013. He progressed at that time.  5. He is S/P radiation to the right sacral bone between 4/22 to 4/30.  6. He is S/P XRT to the left shoulder 03/20/14 to 03/31/14. 7. Votrient 800 mg by mouth daily from 03/2013 through 06/22/2015. Discontinued secondary to disease progression. 8. Nivolumab 3 mg/kg given every 2 weeks started on 06/29/2015. He is status post 4 cycles completed 08/10/2015. He developed disease progression in September 2016.  9. Status post radiation therapy to the left mid fibula completed on 11/14/2015. He received a grade 1 fraction. 10. He is status post radiation to the proximal and distal femur over 2 weeks and 10 fractions of total of 30 Gy.  This was completed in March 2019. 11.   Radiation therapy to the right tibia.  Last treatment completed in September 2019.    Current therapy:    Cabometyx 60 mg daily started in November 2016.     Interim History:  Keith [name] is here for a repeat evaluation.  Since last visit, he reports no major changes with his health.  He remains on Cabometyx 60 mg without any major complications.  He denies any excessive fatigue, tiredness or diarrhea.  His weight and appetite remains the same.  He continues to have issues with  anxiety that is manageable with Cymbalta and occasional Xanax.  He continues to perform activities of daily living without any decline.  Patient denied any alteration mental status, neuropathy, confusion or dizziness.  Denies any headaches or lethargy.  Denies any night sweats, weight loss or changes in appetite.  Denied orthopnea, dyspnea on exertion or chest discomfort.  Denies shortness of breath, difficulty breathing hemoptysis or cough.  Denies any abdominal distention, nausea, early satiety or dyspepsia.  Denies any hematuria, frequency, dysuria or nocturia.  Denies any skin irritation, dryness or rash.  Denies any ecchymosis or petechiae.  Denies any lymphadenopathy or clotting.  Denies any heat or cold intolerance. Remaining review of system is negative.     Medications: I have reviewed the patient's current medications.  Current Outpatient Medications  Medication Sig Dispense Refill  . ALPRAZolam (XANAX) 0.5 MG tablet Take 1 tablet (0.5 mg total) by mouth 3 (three) times daily as needed for anxiety. 90 tablet 0  . cabozantinib (CABOMETYX) 60 MG tablet Take 1 tablet (60 mg total) by mouth daily. With at least 8 ounces of water on an empty stomach. Do not eat for 2 hours before or 1 hour after. Swallow whole. 30 tablet 0  . calcium carbonate (TUMS - DOSED IN MG ELEMENTAL CALCIUM) 500 MG chewable tablet Chew 1 tablet by mouth as needed for indigestion or heartburn.    . diphenoxylate-atropine (LOMOTIL) 2.5-0.025 MG tablet Take 1 tablet by mouth  4 (four) times daily as needed for diarrhea or loose stools. 60 tablet 0  . DULoxetine (CYMBALTA) 60 MG capsule TAKE 1 CAPSULE (60 MG TOTAL) BY MOUTH 2 (TWO) TIMES DAILY. 180 capsule 0  . ferrous sulfate 325 (65 FE) MG tablet Take 1 tablet (325 mg total) by mouth 3 (three) times daily after meals.  3  . furosemide (LASIX) 20 MG tablet Take 1 tablet (20 mg total) by mouth daily as needed. 30 tablet 0  . furosemide (LASIX) 20 MG tablet TAKE 1 TABLET BY  MOUTH EVERY DAY AS NEEDED 30 tablet 0  . gabapentin (NEURONTIN) 300 MG capsule TAKE 1 CAPSULE BY MOUTH THREE TIMES A DAY 270 capsule 0  . HYDROmorphone (DILAUDID) 8 MG tablet Take 1 tablet (8 mg total) by mouth every 4 (four) hours as needed for severe pain. 30 tablet 0  . rivaroxaban (XARELTO) 20 MG TABS tablet Take 1 tablet (20 mg total) by mouth daily with supper. 90 tablet 3  . tiZANidine (ZANAFLEX) 4 MG tablet Take 1 tablet (4 mg total) by mouth every 8 (eight) hours as needed for muscle spasms. 30 tablet 0   No current facility-administered medications for this visit.      Allergies:  Allergies  Allergen Reactions  . Ceftriaxone Hives  . Hydrocodone Swelling    Past Medical History, Surgical history, Social history, and Family History were reviewed and updated.   Physical Exam:  Blood pressure (!) 146/80, pulse 87, temperature 97.9 F (36.6 C), temperature source Oral, resp. rate 18, height 6\' 3"  (1.905 m), weight 179 lb 9.6 oz (81.5 kg), SpO2 96 %.    ECOG: 1    General appearance: Alert, awake without any distress. Head: Atraumatic without abnormalities Oropharynx: Without any thrush or ulcers. Eyes: No scleral icterus. Lymph nodes: No lymphadenopathy noted in the cervical, supraclavicular, or axillary nodes Heart:regular rate and rhythm, without any murmurs or gallops.   Lung: Clear to auscultation without any rhonchi, wheezes or dullness to percussion. Abdomin: Soft, nontender without any shifting dullness or ascites. Musculoskeletal: No clubbing or cyanosis. Neurological: No motor or sensory deficits. Skin: No rashes or lesions.    .   .  Lab Results: Lab Results  Component Value Date   WBC 6.5 04/06/2019   HGB 11.4 (L) 04/06/2019   HCT 38.1 (L) 04/06/2019   MCV 102.7 (H) 04/06/2019   PLT 110 (L) 04/06/2019     Impression and Plan:  53 year old man with  1.  Renal cell carcinoma diagnosed in 2009 with synchronous pulmonary metastasis  indicating stage IV disease.   He continues to take Cabometyx without any major complications.  His disease status remains under excellent control based on CT scan done in December 2019.  Risks and benefits of continuing this treatment was discussed today.  Alternative options were also reviewed which include returning to immunotherapy.  After discussion today the plan is to continue with the same dose and schedule and repeat imaging studies in June 2020.   2.  Pulmonary embolism: He continues to be on Xarelto without any bleeding complications.  I recommended continuing treatment for the time being.  Long-term anticoagulation is preferred.  3.  Anemia: Related to chronic disease and malignancy.  His hemoglobin is adequate at this time.  4.  Diarrhea: No issues reported at this time.  Continues to manage this reasonably well.  5.  Anxiety: Currently on Xanax and Cymbalta and I will refill both of those.  6.  Prognosis: His disease  is incurable but has been managed for an extended period of time.  Aggressive therapy remains warranted.  7.  Follow-up: In June 2020 after repeat imaging studies.  25  minutes was spent with the patient face-to-face today.  More than 50% of time was spent on reviewing his disease status, treatment options, complications related to therapy and outlining future plan of care.     Zola Button, MD 04/06/19

## 2019-04-06 NOTE — Telephone Encounter (Signed)
Scheduled appt per 4/15 sch message - unable to reach patient sent letter in the mail

## 2019-04-18 DIAGNOSIS — G620 Drug-induced polyneuropathy: Secondary | ICD-10-CM | POA: Diagnosis not present

## 2019-04-19 DIAGNOSIS — C642 Malignant neoplasm of left kidney, except renal pelvis: Secondary | ICD-10-CM | POA: Diagnosis not present

## 2019-04-21 ENCOUNTER — Other Ambulatory Visit: Payer: Self-pay

## 2019-04-21 DIAGNOSIS — C649 Malignant neoplasm of unspecified kidney, except renal pelvis: Secondary | ICD-10-CM

## 2019-04-21 DIAGNOSIS — C7951 Secondary malignant neoplasm of bone: Secondary | ICD-10-CM

## 2019-04-21 DIAGNOSIS — K8689 Other specified diseases of pancreas: Secondary | ICD-10-CM

## 2019-04-21 DIAGNOSIS — C7800 Secondary malignant neoplasm of unspecified lung: Secondary | ICD-10-CM

## 2019-04-21 MED ORDER — CABOZANTINIB S-MALATE 60 MG PO TABS: 60.0000 mg | ORAL_TABLET | Freq: Every day | ORAL | 0 refills | Status: DC

## 2019-05-04 ENCOUNTER — Other Ambulatory Visit: Payer: Self-pay | Admitting: Oncology

## 2019-05-09 ENCOUNTER — Other Ambulatory Visit: Payer: Self-pay | Admitting: Oncology

## 2019-05-09 MED ORDER — ALPRAZOLAM 1 MG PO TABS: 1.0000 mg | ORAL_TABLET | Freq: Three times a day (TID) | ORAL | 0 refills | Status: DC | PRN

## 2019-05-23 ENCOUNTER — Other Ambulatory Visit: Payer: Self-pay

## 2019-05-23 DIAGNOSIS — C7800 Secondary malignant neoplasm of unspecified lung: Secondary | ICD-10-CM

## 2019-05-23 DIAGNOSIS — C7951 Secondary malignant neoplasm of bone: Secondary | ICD-10-CM

## 2019-05-23 DIAGNOSIS — K8689 Other specified diseases of pancreas: Secondary | ICD-10-CM

## 2019-05-23 DIAGNOSIS — C649 Malignant neoplasm of unspecified kidney, except renal pelvis: Secondary | ICD-10-CM

## 2019-05-23 MED ORDER — CABOZANTINIB S-MALATE 60 MG PO TABS: 60.0000 mg | ORAL_TABLET | Freq: Every day | ORAL | 0 refills | Status: DC

## 2019-06-01 IMAGING — CT CT CHEST W/ CM
2 of 15 series · 9 of 46 positions shown, 15 images · IV contrast (agent unspecified)
Comparison: 03/10/2017 CT chest, abdomen and pelvis.

CLINICAL DATA: Status post left nephrectomy for renal cell
carcinoma in 5779. Status post stereotactic radiotherapy to
pulmonary metastases April 2009. Additional history of pancreatic and
bone metastases. Status post radiation therapy to right sacral, left
shoulder and left mid fibula bone lesions in 7382-7381. Ongoing
chemotherapy. Restaging.

EXAM:
CT CHEST WITH CONTRAST
CT ABDOMEN AND PELVIS WITH AND WITHOUT CONTRAST
TECHNIQUE: Multidetector CT imaging of the chest was performed during
intravenous contrast administration. Multidetector CT imaging of the
abdomen and pelvis was performed following the standard protocol
before and during bolus administration of intravenous contrast.
CONTRAST:  100 cc [id number] IV.

[Series 4: coronal pre · coronal · non-contrast · 0.52mm/px · 1 of 99 slices shown, 2 images]
[im 50/99  soft-tissue]
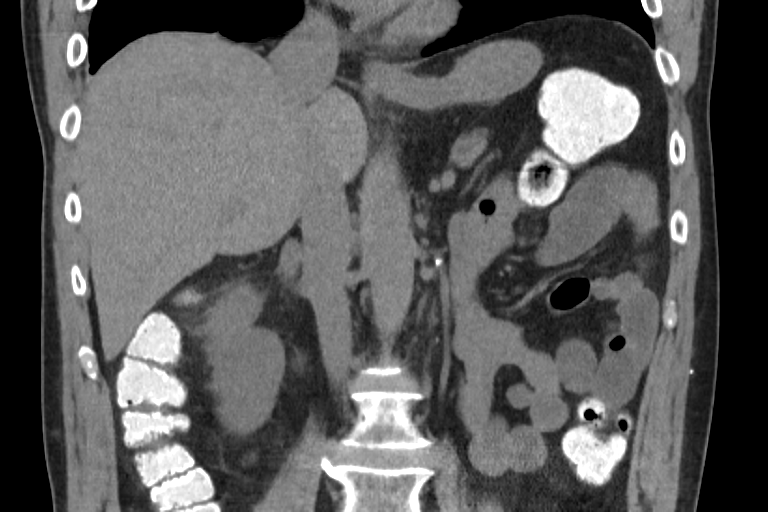
[im 50/99  bone]
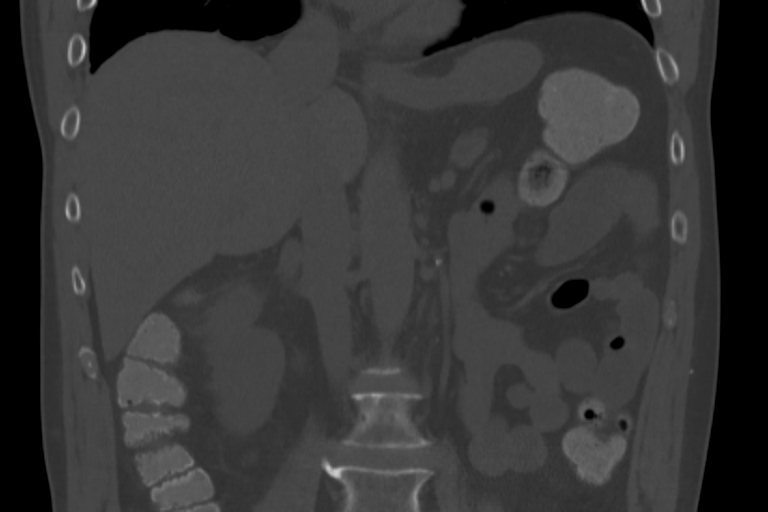

[Series 11: axial nephro · axial · 0.79mm/px · z∈[+1060,+1585]mm · 8 of 225 slices shown, 13 images]
[im 25/225  soft-tissue]
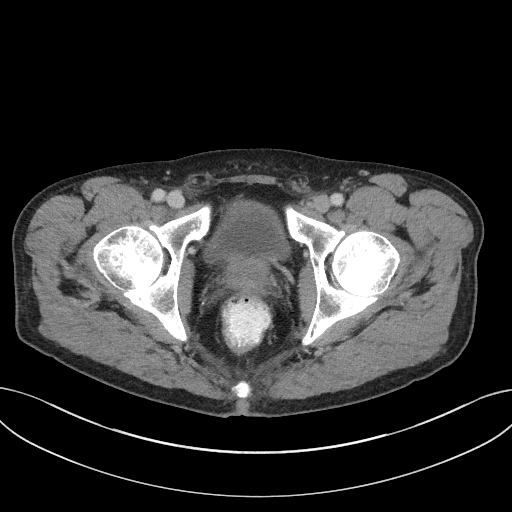
[im 25/225  bone]
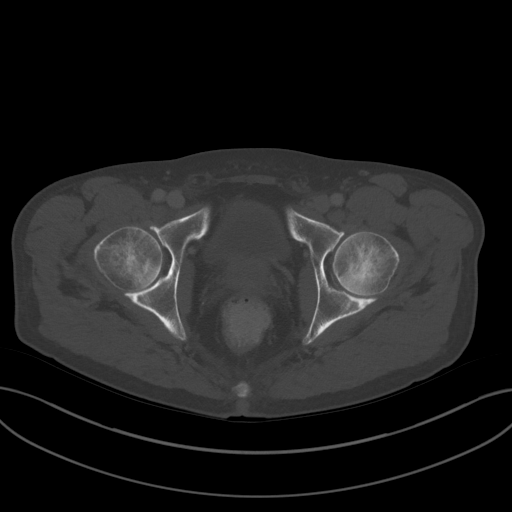
[im 50/225  soft-tissue]
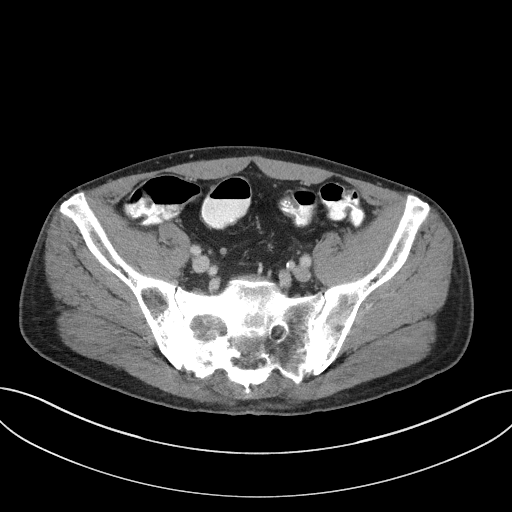
[im 75/225  soft-tissue]
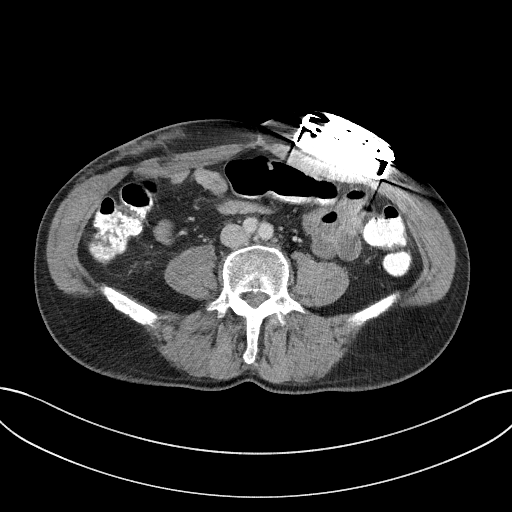
[im 100/225  soft-tissue]
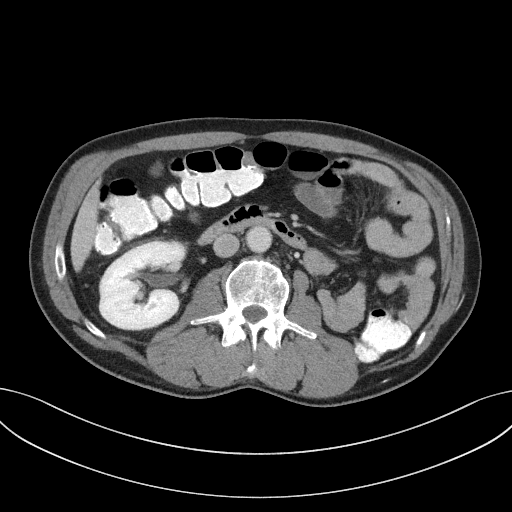
[im 125/225  soft-tissue]
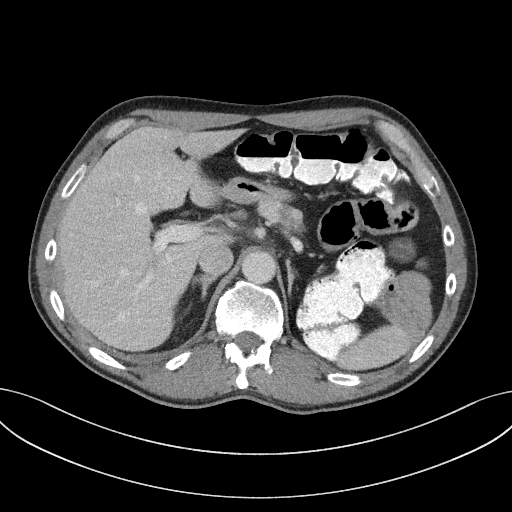
[im 125/225  lung]
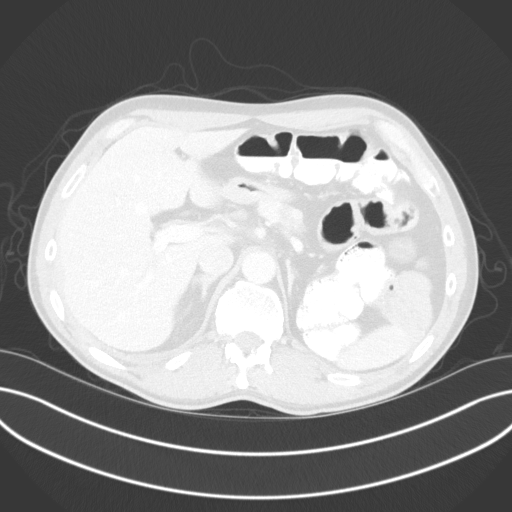
[im 150/225  soft-tissue]
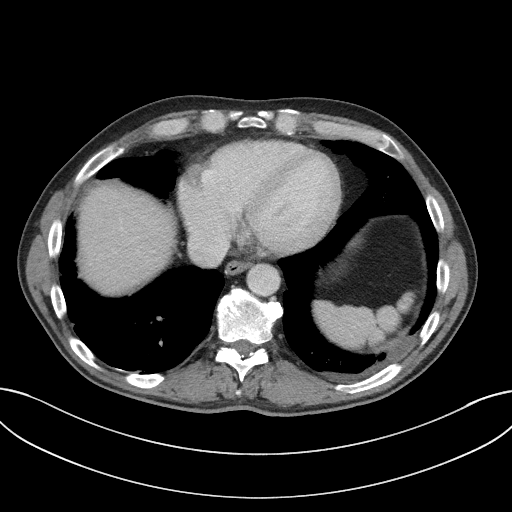
[im 150/225  lung]
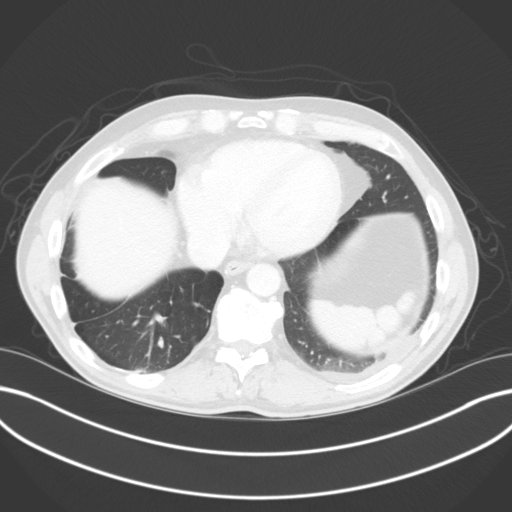
[im 175/225  soft-tissue]
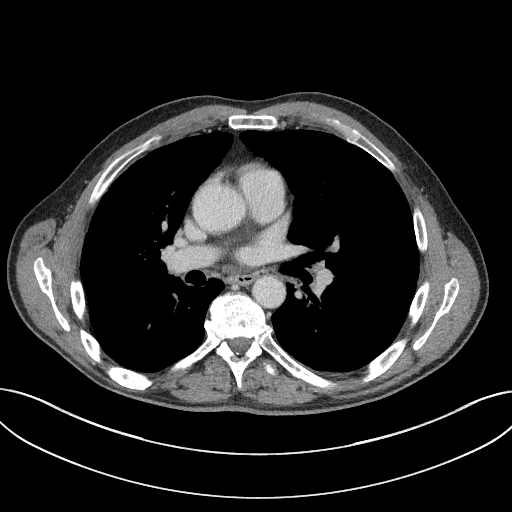
[im 175/225  lung]
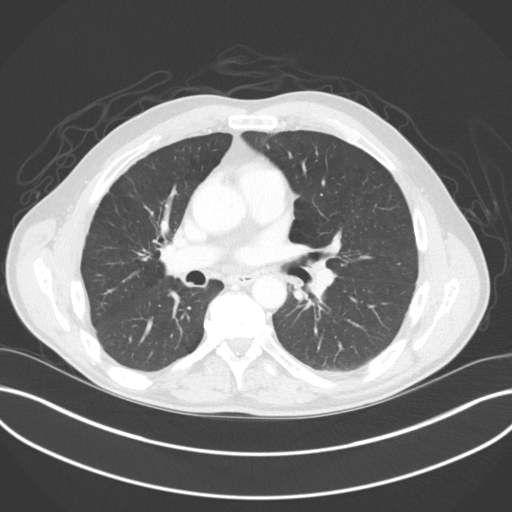
[im 200/225  soft-tissue]
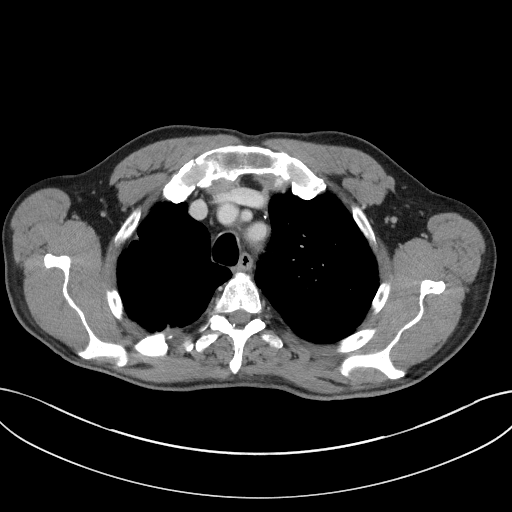
[im 200/225  lung]
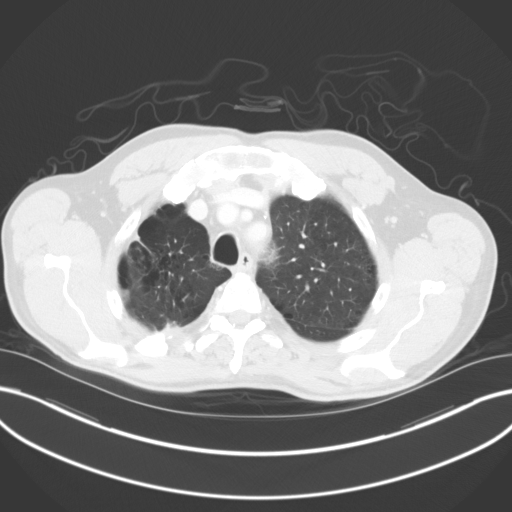

[9 of 46 positions shown; findings below may reference images not displayed]

FINDINGS: CT CHEST FINDINGS

Cardiovascular: Normal heart size. No significant pericardial
fluid/thickening. Ectatic 4.0 cm ascending thoracic aorta, stable.
Normal caliber pulmonary arteries. No central pulmonary emboli.

Mediastinum/Nodes: No discrete thyroid nodules. Unremarkable
esophagus. No pathologically enlarged axillary, mediastinal or hilar
lymph nodes. Heterogeneous top-normal size 0.8 cm posterior left
hilar node (series 11/ image 47) is stable.

Lungs/Pleura: No pneumothorax. No right pleural effusion. Trace
dependent basilar left pleural effusion/thickening is stable.
Mild-to-moderate centrilobular and paraseptal emphysema. Stable
postsurgical changes from wedge resection in the right lower lobe.
Central right middle lobe spiculated pulmonary nodule measures 2.3 x
2.1 cm (series 13/ image 89), previously 2.3 x 2.0 cm, stable.
Irregular sharply marginated bandlike consolidation in the parahilar
right upper lobe with associated volume loss and distortion is
unchanged and compatible with radiation fibrosis. Irregular
peripheral basilar left lower lobe 2.3 x 1.3 cm nodule (series 13/
image 108), previously 2.4 x 1.3 cm, stable. No acute consolidative
airspace disease or new significant pulmonary nodules.

Musculoskeletal: Stable mildly expansile sclerotic posterior right
fourth rib focus, which may be treatment related. No new focal
osseous lesions in the chest. Mild thoracic spondylosis.

CT ABDOMEN AND PELVIS FINDINGS

Hepatobiliary: Normal liver size. Subcentimeter subcapsular vague
focus of arterial phase hyperenhancement of the posterior right
liver lobe (series 6/ image 26) is occult on the other sequences and
stable since 03/10/2017, most suggestive of a benign transient
hepatic attenuation difference. Otherwise no liver lesions. Normal
gallbladder with no radiopaque cholelithiasis. No biliary ductal
dilatation.

Pancreas: Heterogeneous hyperenhancing 2.7 x 2.4 cm anterior
pancreatic head mass (series 6/image 38), previously 2.7 x 2.4 cm,
stable. Hyperenhancing 1.8 x 1.8 cm pancreatic body mass (series
6/image 20), previously 1.9 x 1.6 cm, stable, with associated stable
pancreatic duct dilation and pancreatic parenchymal atrophy in the
pancreatic tail distal to this lesion. No new pancreatic mass.

Spleen: Normal size. No mass.

Adrenals/Urinary Tract: Normal adrenals. Status post left
nephrectomy. No mass or fluid collection in the left nephrectomy
bed. Stable compensatory hypertrophy of the right kidney. No right
renal stones. No right hydronephrosis hypodense 0.3 cm anterior
lower right renal cortical lesion is too small to characterize and
stable. Otherwise no right renal lesions. Normal bladder.

Stomach/Bowel: Grossly normal stomach. Normal caliber small bowel
with no small bowel wall thickening. Normal appendix. Normal large
bowel with no diverticulosis, large bowel wall thickening or
pericolonic fat stranding.

Vascular/Lymphatic: Atherosclerotic nonaneurysmal abdominal aorta.
Patent portal, splenic, hepatic and right renal veins. No
pathologically enlarged lymph nodes in the abdomen or pelvis.

Reproductive: Normal size prostate.

Other: No pneumoperitoneum, ascites or focal fluid collection.
Stable stimulator device in the ventral left abdominal wall with
lead coursing into and terminating in the posterior thoracic spinal
canal.

Musculoskeletal: Mixed lytic and sclerotic lesions in the right
upper sacrum, proximal right femoral metaphysis and medial inferior
right pubic ramus appear stable. No new focal osseous lesions. Mild
lumbar spondylosis.
IMPRESSION: 1. No new or progressive metastatic disease.
2. Treated bilateral pulmonary nodules appear stable .
3. Hyperenhancing pancreatic metastases are stable .
4. Osseous lesions in the right upper sacrum, proximal right femoral
metaphysis and inferior right pubic ramus appear stable.
5. Aortic Atherosclerosis (TGXSN-2KB.B) and Emphysema (TGXSN-N6V.1).
Stable ectatic 4.0 cm ascending thoracic aorta. Recommend annual
imaging followup by CTA or MRA. This recommendation follows 0474
ACCF/AHA/AATS/ACR/ASA/SCA/SALAMON-PETAR/ADWOAH/ERLITO/ZJ ROSH Guidelines for the
Diagnosis and Management of Patients with Thoracic Aortic Disease.
Circulation. 0474; 121: [id number].

## 2019-06-03 ENCOUNTER — Other Ambulatory Visit: Payer: Self-pay

## 2019-06-03 ENCOUNTER — Ambulatory Visit (HOSPITAL_COMMUNITY)
Admission: RE | Admit: 2019-06-03 | Discharge: 2019-06-03 | Disposition: A | Discharge status: Home / Self Care | Payer: Medicare Other | Source: Ambulatory Visit | Attending: Oncology | Admitting: Oncology

## 2019-06-03 ENCOUNTER — Inpatient Hospital Stay: Payer: Medicare Other | Attending: Oncology

## 2019-06-03 DIAGNOSIS — C649 Malignant neoplasm of unspecified kidney, except renal pelvis: Secondary | ICD-10-CM | POA: Diagnosis not present

## 2019-06-03 DIAGNOSIS — Z905 Acquired absence of kidney: Secondary | ICD-10-CM | POA: Insufficient documentation

## 2019-06-03 DIAGNOSIS — C7800 Secondary malignant neoplasm of unspecified lung: Secondary | ICD-10-CM | POA: Diagnosis not present

## 2019-06-03 DIAGNOSIS — R918 Other nonspecific abnormal finding of lung field: Secondary | ICD-10-CM | POA: Insufficient documentation

## 2019-06-03 DIAGNOSIS — Z7901 Long term (current) use of anticoagulants: Secondary | ICD-10-CM | POA: Diagnosis not present

## 2019-06-03 DIAGNOSIS — K869 Disease of pancreas, unspecified: Secondary | ICD-10-CM | POA: Diagnosis not present

## 2019-06-03 DIAGNOSIS — Z79899 Other long term (current) drug therapy: Secondary | ICD-10-CM | POA: Diagnosis not present

## 2019-06-03 DIAGNOSIS — C7951 Secondary malignant neoplasm of bone: Secondary | ICD-10-CM | POA: Insufficient documentation

## 2019-06-03 LAB — CBC WITH DIFFERENTIAL (CANCER CENTER ONLY)
Abs Immature Granulocytes: 0.02 10*3/uL (ref 0.00–0.07)
Basophils Absolute: 0 10*3/uL (ref 0.0–0.1)
Basophils Relative: 0 %
Eosinophils Absolute: 0.1 10*3/uL (ref 0.0–0.5)
Eosinophils Relative: 3 %
HCT: 35.6 % — ABNORMAL LOW (ref 39.0–52.0)
Hemoglobin: 10.9 g/dL — ABNORMAL LOW (ref 13.0–17.0)
Immature Granulocytes: 0 %
Lymphocytes Relative: 20 %
Lymphs Abs: 0.9 10*3/uL (ref 0.7–4.0)
MCH: 31.4 pg (ref 26.0–34.0)
MCHC: 30.6 g/dL (ref 30.0–36.0)
MCV: 102.6 fL — ABNORMAL HIGH (ref 80.0–100.0)
Monocytes Absolute: 0.3 10*3/uL (ref 0.1–1.0)
Monocytes Relative: 6 %
Neutro Abs: 3.3 10*3/uL (ref 1.7–7.7)
Neutrophils Relative %: 71 %
Platelet Count: 102 10*3/uL — ABNORMAL LOW (ref 150–400)
RBC: 3.47 MIL/uL — ABNORMAL LOW (ref 4.22–5.81)
RDW: 18.7 % — ABNORMAL HIGH (ref 11.5–15.5)
WBC Count: 4.6 10*3/uL (ref 4.0–10.5)
nRBC: 0 % (ref 0.0–0.2)

## 2019-06-03 LAB — CMP (CANCER CENTER ONLY)
ALT: 19 U/L (ref 0–44)
AST: 20 U/L (ref 15–41)
Albumin: 3.3 g/dL — ABNORMAL LOW (ref 3.5–5.0)
Alkaline Phosphatase: 91 U/L (ref 38–126)
Anion gap: 9 (ref 5–15)
BUN: 11 mg/dL (ref 6–20)
CO2: 23 mmol/L (ref 22–32)
Calcium: 8.5 mg/dL — ABNORMAL LOW (ref 8.9–10.3)
Chloride: 107 mmol/L (ref 98–111)
Creatinine: 1.13 mg/dL (ref 0.61–1.24)
GFR, Est AFR Am: >60 mL/min (ref >60)
GFR, Estimated: >60 mL/min (ref >60)
Glucose, Bld: 113 mg/dL — ABNORMAL HIGH (ref 70–99)
Potassium: 4.6 mmol/L (ref 3.5–5.1)
Sodium: 139 mmol/L (ref 135–145)
Total Bilirubin: 0.3 mg/dL (ref 0.3–1.2)
Total Protein: 7.3 g/dL (ref 6.5–8.1)

## 2019-06-03 MED ORDER — IOHEXOL 300 MG/ML  SOLN
100.0000 mL | Freq: Once | INTRAMUSCULAR | Status: AC | PRN
  Administered 2019-06-03: 100 mL via INTRAVENOUS

## 2019-06-03 MED ORDER — SODIUM CHLORIDE (PF) 0.9 % IJ SOLN
INTRAMUSCULAR | Status: AC
  Filled 2019-06-03: qty 50

## 2019-06-07 ENCOUNTER — Telehealth: Payer: Self-pay

## 2019-06-07 ENCOUNTER — Inpatient Hospital Stay (HOSPITAL_BASED_OUTPATIENT_CLINIC_OR_DEPARTMENT_OTHER): Payer: Medicare Other | Admitting: Oncology

## 2019-06-07 ENCOUNTER — Other Ambulatory Visit: Payer: Self-pay

## 2019-06-07 VITALS — BP 138/95 | HR 75 | Temp 98.9°F | Resp 17 | Ht 75.0 in | Wt 173.3 lb

## 2019-06-07 DIAGNOSIS — Z7901 Long term (current) use of anticoagulants: Secondary | ICD-10-CM | POA: Diagnosis not present

## 2019-06-07 DIAGNOSIS — C7951 Secondary malignant neoplasm of bone: Secondary | ICD-10-CM

## 2019-06-07 DIAGNOSIS — Z79899 Other long term (current) drug therapy: Secondary | ICD-10-CM

## 2019-06-07 DIAGNOSIS — C7800 Secondary malignant neoplasm of unspecified lung: Secondary | ICD-10-CM | POA: Diagnosis not present

## 2019-06-07 DIAGNOSIS — C649 Malignant neoplasm of unspecified kidney, except renal pelvis: Secondary | ICD-10-CM

## 2019-06-07 MED ORDER — ALPRAZOLAM 1 MG PO TABS: 1.0000 mg | ORAL_TABLET | Freq: Three times a day (TID) | ORAL | 0 refills | Status: DC | PRN

## 2019-06-07 MED ORDER — MEGESTROL ACETATE 400 MG/10ML PO SUSP: 400.0000 mg | Freq: Two times a day (BID) | ORAL | 0 refills | Status: DC

## 2019-06-07 NOTE — Telephone Encounter (Signed)
Received call from [facility] stating that the prescription for Cabometyx that was faxed was not signed. Explained that this RN is looking at the signed script that was faxed on 6/2. Pharmacist then stated that the faxed prescription looks like it was cut off. Refaxed prescription to [phone] with confirmation of receipt.

## 2019-06-07 NOTE — Progress Notes (Signed)
Hematology and Oncology Follow Up Visit  Keith [name] [id number] 10-Feb-1966 53 y.o. 06/07/2019 2:49 PM  [name], MD  [name], M.D.  [name], MD  [name], MD    Principle Diagnosis: 54 year old man with renal cell carcinoma diagnosed in 2009.  He presented with stage IV disease and pulmonary involvement.  He has also documented bone disease. Prior Therapy: 1. Status post laparoscopic radical nephrectomy.  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  2. Patient status post thoracotomy for a synchronous metastatic lung lesions done October 2009.   3. Patient is status post stereotactic radiotherapy to pulmonary nodules in May of 2010. 4. He is S/P Sutent 50 mg 4 weeks on 2 weeks off from 10/2010 to 03/2013. He progressed at that time.  5. He is S/P radiation to the right sacral bone between 4/22 to 4/30.  6. He is S/P XRT to the left shoulder 03/20/14 to 03/31/14. 7. Votrient 800 mg by mouth daily from 03/2013 through 06/22/2015. Discontinued secondary to disease progression. 8. Nivolumab 3 mg/kg given every 2 weeks started on 06/29/2015. He is status post 4 cycles completed 08/10/2015. He developed disease progression in September 2016.  9. Status post radiation therapy to the left mid fibula completed on 11/14/2015. He received a grade 1 fraction. 10. He is status post radiation to the proximal and distal femur over 2 weeks and 10 fractions of total of 30 Gy.  This was completed in March 2019. 11.   Radiation therapy to the right tibia.  Last treatment completed in September 2019.    Current therapy:    Cabometyx 60 mg daily started in November 2016.     Interim History:  [name] returns today for a follow-up.  Since the last visit, he reports no major changes in his health.  He does report slight decrease in appetite periodically although no abdominal pain or early satiety.  Does report to loose bowel habits which is  manageable with Imodium.  Denies any recent bleeding or thrombosis episodes.  He denies any recent hospitalizations or illnesses.  He denies any worsening bone pain.  He denied headaches, blurry vision, syncope or seizures.  Denies any fevers, chills or sweats.  Denied chest pain, palpitation, orthopnea or leg edema.  Denied cough, wheezing or hemoptysis.  Denied nausea, vomiting or abdominal pain.  Denies any constipation or diarrhea.  Denies any frequency urgency or hesitancy.  Denies any arthralgias or myalgias.  Denies any skin rashes or lesions.  Denies any bleeding or clotting tendency.  Denies any easy bruising.  Denies any hair or nail changes.  Denies any anxiety or depression.  Remaining review of system is negative. .     Medications: I have reviewed the patient's current medications.  Current Outpatient Medications  Medication Sig Dispense Refill  . ALPRAZolam (XANAX) 1 MG tablet Take 1 tablet (1 mg total) by mouth 3 (three) times daily as needed for anxiety. 90 tablet 0  . cabozantinib (CABOMETYX) 60 MG tablet Take 1 tablet (60 mg total) by mouth daily. With at least 8 ounces of water on an empty stomach. Do not eat for 2 hours before or 1 hour after. Swallow whole. 30 tablet 0  . calcium carbonate (TUMS - DOSED IN MG ELEMENTAL CALCIUM) 500 MG chewable tablet Chew 1 tablet by mouth as needed for indigestion or heartburn.    . diphenoxylate-atropine (LOMOTIL) 2.5-0.025 MG tablet Take 1 tablet by mouth 4 (four)  times daily as needed for diarrhea or loose stools. 60 tablet 0  . DULoxetine (CYMBALTA) 60 MG capsule Take 1 capsule (60 mg total) by mouth 2 (two) times daily. 180 capsule 0  . ferrous sulfate 325 (65 FE) MG tablet Take 1 tablet (325 mg total) by mouth 3 (three) times daily after meals.  3  . furosemide (LASIX) 20 MG tablet Take 1 tablet (20 mg total) by mouth daily as needed. 30 tablet 0  . furosemide (LASIX) 20 MG tablet TAKE 1 TABLET BY MOUTH EVERY DAY AS NEEDED 30 tablet 0   . gabapentin (NEURONTIN) 300 MG capsule TAKE 1 CAPSULE BY MOUTH THREE TIMES A DAY 270 capsule 0  . HYDROmorphone (DILAUDID) 8 MG tablet Take 1 tablet (8 mg total) by mouth every 4 (four) hours as needed for severe pain. 30 tablet 0  . rivaroxaban (XARELTO) 20 MG TABS tablet Take 1 tablet (20 mg total) by mouth daily with supper. 90 tablet 3  . tiZANidine (ZANAFLEX) 4 MG tablet Take 1 tablet (4 mg total) by mouth every 8 (eight) hours as needed for muscle spasms. 30 tablet 0   No current facility-administered medications for this visit.      Allergies:  Allergies  Allergen Reactions  . Ceftriaxone Hives  . Hydrocodone Swelling    Past Medical History, Surgical history, Social history, and Family History were reviewed and updated.   Physical Exam:  Blood pressure (!) 138/95, pulse 75, temperature 98.9 F (37.2 C), temperature source Oral, resp. rate 17, height 6\' 3"  (1.905 m), weight 173 lb 4.8 oz (78.6 kg), SpO2 100 %.     ECOG: 1    General appearance: Comfortable appearing without any discomfort Head: Normocephalic without any trauma Oropharynx: Mucous membranes are moist and pink without any thrush or ulcers. Eyes: Pupils are equal and round reactive to light. Lymph nodes: No cervical, supraclavicular, inguinal or axillary lymphadenopathy.   Heart:regular rate and rhythm.  S1 and S2 without leg edema. Lung: Clear without any rhonchi or wheezes.  No dullness to percussion. Abdomin: Soft, nontender, nondistended with good bowel sounds.  No hepatosplenomegaly. Musculoskeletal: No joint deformity or effusion.  Full range of motion noted. Neurological: No deficits noted on motor, sensory and deep tendon reflex exam. Skin: No petechial rash or dryness.  Appeared moist.    .   .  Lab Results: Lab Results  Component Value Date   WBC 4.6 06/03/2019   HGB 10.9 (L) 06/03/2019   HCT 35.6 (L) 06/03/2019   MCV 102.6 (H) 06/03/2019   PLT 102 (L) 06/03/2019     IMPRESSION: 1. Stable pulmonary lesions as detailed above. 2. New airspace opacity in the left lung as detailed above. This could be inflammatory/infectious or a new area of tumor. I would recommend a short-term follow-up chest CT in 3 months to reassess. 3. No mediastinal or hilar mass or adenopathy. 4. Stable pancreatic masses. 5. Stable lytic metastatic osseous lesions. No new or progressive findings. 6. Status post left nephrectomy.  The right kidney is unremarkable.   Impression and Plan:  53 year old man with  1.  Stage IV renal cell carcinoma diagnosed in 2009 with involvement into the lung as well as bone disease.   He remains on Cabometyx 60 mg daily and has tolerated it without any new complaints.   CT scan obtained on June 03, 2019 was personally reviewed and continues to show stable disease at this time.  Risks and benefits of continuing this treatment long-term was  discussed.  Different salvage therapy were also reviewed including immune therapy versus different salvage agents.  At this time plan is to continue with the same dose and schedule and repeat imaging studies in 6 months.  He is agreeable to proceed with this plan.  2.  Pulmonary embolism: No recent thrombosis or bleeding episodes.  I recommended continuing Xarelto at this time.  3.  Anemia: Hemoglobin relatively stable at this time without any recent exacerbation.  This is related to malignancy and treatment.  4.  Diarrhea: Manageable at this time.  5.  Anxiety: Mood reportedly stable on Cymbalta.  6.  Prognosis: Therapy remains palliative although aggressive measures are warranted given his reasonable performance status.  7.  Follow-up: Will be in 6 to 8 weeks for repeat evaluation.  25  minutes was spent with the patient face-to-face today.  More than 50% of time was dedicated to reviewing his disease status, treatment options and reviewing imaging studies.     Zola Button, MD 06/07/19

## 2019-06-08 ENCOUNTER — Telehealth: Payer: Self-pay | Admitting: Oncology

## 2019-06-08 NOTE — Telephone Encounter (Signed)
Scheduled per los. Called and spoke with patient. Confirmed date and time

## 2019-06-14 DIAGNOSIS — C642 Malignant neoplasm of left kidney, except renal pelvis: Secondary | ICD-10-CM | POA: Diagnosis not present

## 2019-06-15 DIAGNOSIS — C642 Malignant neoplasm of left kidney, except renal pelvis: Secondary | ICD-10-CM | POA: Diagnosis not present

## 2019-06-24 ENCOUNTER — Other Ambulatory Visit: Payer: Self-pay | Admitting: Oncology

## 2019-07-02 ENCOUNTER — Other Ambulatory Visit: Payer: Self-pay | Admitting: Oncology

## 2019-07-04 ENCOUNTER — Other Ambulatory Visit: Payer: Self-pay

## 2019-07-04 DIAGNOSIS — C7800 Secondary malignant neoplasm of unspecified lung: Secondary | ICD-10-CM

## 2019-07-04 DIAGNOSIS — K8689 Other specified diseases of pancreas: Secondary | ICD-10-CM

## 2019-07-04 DIAGNOSIS — C7951 Secondary malignant neoplasm of bone: Secondary | ICD-10-CM

## 2019-07-04 DIAGNOSIS — C649 Malignant neoplasm of unspecified kidney, except renal pelvis: Secondary | ICD-10-CM

## 2019-07-04 MED ORDER — CABOMETYX 60 MG PO TABS: 60.0000 mg | ORAL_TABLET | Freq: Every day | ORAL | 0 refills | Status: DC

## 2019-07-12 ENCOUNTER — Other Ambulatory Visit: Payer: Self-pay | Admitting: Oncology

## 2019-07-12 MED ORDER — ALPRAZOLAM 1 MG PO TABS: 1.0000 mg | ORAL_TABLET | Freq: Three times a day (TID) | ORAL | 0 refills | Status: DC | PRN

## 2019-07-18 ENCOUNTER — Other Ambulatory Visit: Payer: Self-pay | Admitting: Oncology

## 2019-08-09 ENCOUNTER — Other Ambulatory Visit: Payer: Self-pay

## 2019-08-09 ENCOUNTER — Inpatient Hospital Stay: Payer: Medicare Other

## 2019-08-09 ENCOUNTER — Inpatient Hospital Stay: Payer: Medicare Other | Attending: Oncology | Admitting: Oncology

## 2019-08-09 VITALS — BP 151/101 | HR 77 | Temp 98.5°F | Resp 17 | Ht 75.0 in | Wt 172.9 lb

## 2019-08-09 DIAGNOSIS — Z86711 Personal history of pulmonary embolism: Secondary | ICD-10-CM | POA: Diagnosis not present

## 2019-08-09 DIAGNOSIS — C649 Malignant neoplasm of unspecified kidney, except renal pelvis: Secondary | ICD-10-CM

## 2019-08-09 DIAGNOSIS — R197 Diarrhea, unspecified: Secondary | ICD-10-CM | POA: Diagnosis not present

## 2019-08-09 DIAGNOSIS — Z7901 Long term (current) use of anticoagulants: Secondary | ICD-10-CM | POA: Diagnosis not present

## 2019-08-09 DIAGNOSIS — C7951 Secondary malignant neoplasm of bone: Secondary | ICD-10-CM

## 2019-08-09 DIAGNOSIS — F419 Anxiety disorder, unspecified: Secondary | ICD-10-CM | POA: Diagnosis not present

## 2019-08-09 DIAGNOSIS — C7800 Secondary malignant neoplasm of unspecified lung: Secondary | ICD-10-CM | POA: Insufficient documentation

## 2019-08-09 DIAGNOSIS — Z79899 Other long term (current) drug therapy: Secondary | ICD-10-CM | POA: Insufficient documentation

## 2019-08-09 DIAGNOSIS — D649 Anemia, unspecified: Secondary | ICD-10-CM | POA: Insufficient documentation

## 2019-08-09 DIAGNOSIS — Z923 Personal history of irradiation: Secondary | ICD-10-CM | POA: Insufficient documentation

## 2019-08-09 DIAGNOSIS — Z905 Acquired absence of kidney: Secondary | ICD-10-CM | POA: Insufficient documentation

## 2019-08-09 LAB — CMP (CANCER CENTER ONLY)
ALT: 23 U/L (ref 0–44)
AST: 20 U/L (ref 15–41)
Albumin: 2.9 g/dL — ABNORMAL LOW (ref 3.5–5.0)
Alkaline Phosphatase: 79 U/L (ref 38–126)
Anion gap: 8 (ref 5–15)
BUN: 12 mg/dL (ref 6–20)
CO2: 27 mmol/L (ref 22–32)
Calcium: 8 mg/dL — ABNORMAL LOW (ref 8.9–10.3)
Chloride: 108 mmol/L (ref 98–111)
Creatinine: 1.16 mg/dL (ref 0.61–1.24)
GFR, Est AFR Am: >60 mL/min (ref >60)
GFR, Estimated: >60 mL/min (ref >60)
Glucose, Bld: 82 mg/dL (ref 70–99)
Potassium: 3.9 mmol/L (ref 3.5–5.1)
Sodium: 143 mmol/L (ref 135–145)
Total Bilirubin: <0.2 mg/dL — ABNORMAL LOW (ref 0.3–1.2)
Total Protein: 6.2 g/dL — ABNORMAL LOW (ref 6.5–8.1)

## 2019-08-09 LAB — CBC WITH DIFFERENTIAL (CANCER CENTER ONLY)
Abs Immature Granulocytes: 0.01 10*3/uL (ref 0.00–0.07)
Basophils Absolute: 0 10*3/uL (ref 0.0–0.1)
Basophils Relative: 1 %
Eosinophils Absolute: 0.1 10*3/uL (ref 0.0–0.5)
Eosinophils Relative: 2 %
HCT: 36.2 % — ABNORMAL LOW (ref 39.0–52.0)
Hemoglobin: 10.9 g/dL — ABNORMAL LOW (ref 13.0–17.0)
Immature Granulocytes: 0 %
Lymphocytes Relative: 22 %
Lymphs Abs: 1.2 10*3/uL (ref 0.7–4.0)
MCH: 32.2 pg (ref 26.0–34.0)
MCHC: 30.1 g/dL (ref 30.0–36.0)
MCV: 106.8 fL — ABNORMAL HIGH (ref 80.0–100.0)
Monocytes Absolute: 0.3 10*3/uL (ref 0.1–1.0)
Monocytes Relative: 6 %
Neutro Abs: 3.6 10*3/uL (ref 1.7–7.7)
Neutrophils Relative %: 69 %
Platelet Count: 99 10*3/uL — ABNORMAL LOW (ref 150–400)
RBC: 3.39 MIL/uL — ABNORMAL LOW (ref 4.22–5.81)
RDW: 17.5 % — ABNORMAL HIGH (ref 11.5–15.5)
WBC Count: 5.2 10*3/uL (ref 4.0–10.5)
nRBC: 0 % (ref 0.0–0.2)

## 2019-08-09 NOTE — Progress Notes (Addendum)
Hematology and Oncology Follow Up Visit  PATRICE MOATES [id number] [birth year] 53 y.o. 08/09/2019 12:11 PM  [name], MD  [name], M.D.  [name], MD  [name], MD    Principle Diagnosis: 53 year old man with stage IV renal cell carcinoma with a pulmonary and bone involvement diagnosed in 2009.     Prior Therapy: 1. Status post laparoscopic radical nephrectomy.  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  2. Patient status post thoracotomy for a synchronous metastatic lung lesions done October 2009.   3. Patient is status post stereotactic radiotherapy to pulmonary nodules in May of 2010. 4. He is S/P Sutent 50 mg 4 weeks on 2 weeks off from 10/2010 to 03/2013. He progressed at that time.  5. He is S/P radiation to the right sacral bone between 4/22 to 4/30.  6. He is S/P XRT to the left shoulder 03/20/14 to 03/31/14. 7. Votrient 800 mg by mouth daily from 03/2013 through 06/22/2015. Discontinued secondary to disease progression. 8. Nivolumab 3 mg/kg given every 2 weeks started on 06/29/2015. He is status post 4 cycles completed 08/10/2015. He developed disease progression in September 2016.  9. Status post radiation therapy to the left mid fibula completed on 11/14/2015. He received a grade 1 fraction. 10. He is status post radiation to the proximal and distal femur over 2 weeks and 10 fractions of total of 30 Gy.  This was completed in March 2019. 11.   Radiation therapy to the right tibia.  Last treatment completed in September 2019.    Current therapy:    Cabometyx 60 mg daily started in November 2016.     Interim History:  Mr. Oestreich returns today for a repeat evaluation.  Since the last visit, he reports no major changes in his health.  Continues to feel reasonably well without any decline in his performance status or quality of life.  He has tolerated Cabometyx any worsening diarrhea, abdominal pain or skin rash.  His mood  and anxiety remained maintained at this time.  His appetite remains excellent and his weight is stable.  Patient denied any alteration mental status, neuropathy, confusion or dizziness.  Denies any headaches or lethargy.  Denies any night sweats, weight loss or changes in appetite.  Denied orthopnea, dyspnea on exertion or chest discomfort.  Denies shortness of breath, difficulty breathing hemoptysis or cough.  Denies any abdominal distention, nausea, early satiety or dyspepsia.  Denies any hematuria, frequency, dysuria or nocturia.  Denies any skin irritation, dryness or rash.  Denies any ecchymosis or petechiae.  Denies any lymphadenopathy or clotting.  Denies any heat or cold intolerance.  Denies any anxiety or depression.  Remaining review of system is negative.     . .     Medications: Updated today on review. Current Outpatient Medications  Medication Sig Dispense Refill  . ALPRAZolam (XANAX) 1 MG tablet Take 1 tablet (1 mg total) by mouth 3 (three) times daily as needed for anxiety. 90 tablet 0  . cabozantinib (CABOMETYX) 60 MG tablet Take 1 tablet (60 mg total) by mouth daily. With at least 8 ounces of water on an empty stomach. Do not eat for 2 hours before or 1 hour after. Swallow whole. 30 tablet 0  . calcium carbonate (TUMS - DOSED IN MG ELEMENTAL CALCIUM) 500 MG chewable tablet Chew 1 tablet by mouth as needed for indigestion or heartburn.    . diphenoxylate-atropine (LOMOTIL) 2.5-0.025 MG tablet Take 1  tablet by mouth 4 (four) times daily as needed for diarrhea or loose stools. 60 tablet 0  . DULoxetine (CYMBALTA) 60 MG capsule Take 1 capsule (60 mg total) by mouth 2 (two) times daily. 180 capsule 0  . ferrous sulfate 325 (65 FE) MG tablet Take 1 tablet (325 mg total) by mouth 3 (three) times daily after meals.  3  . furosemide (LASIX) 20 MG tablet Take 1 tablet (20 mg total) by mouth daily as needed. 30 tablet 0  . furosemide (LASIX) 20 MG tablet TAKE 1 TABLET BY MOUTH EVERY DAY  AS NEEDED 30 tablet 0  . gabapentin (NEURONTIN) 300 MG capsule TAKE 1 CAPSULE BY MOUTH THREE TIMES A DAY 270 capsule 0  . HYDROmorphone (DILAUDID) 8 MG tablet Take 1 tablet (8 mg total) by mouth every 4 (four) hours as needed for severe pain. 30 tablet 0  . megestrol (MEGACE) 40 MG/ML suspension TAKE 10 MLS (400 MG TOTAL) BY MOUTH 2 (TWO) TIMES DAILY. 240 mL 0  . rivaroxaban (XARELTO) 20 MG TABS tablet Take 1 tablet (20 mg total) by mouth daily with supper. 90 tablet 3  . tiZANidine (ZANAFLEX) 4 MG tablet Take 1 tablet (4 mg total) by mouth every 8 (eight) hours as needed for muscle spasms. 30 tablet 0   No current facility-administered medications for this visit.      Allergies:  Allergies  Allergen Reactions  . Ceftriaxone Hives  . Hydrocodone Swelling    Past Medical History, Surgical history, Social history, and Family History without any changes on review.   Physical Exam:      ECOG: 1   General appearance: Alert, awake without any distress. Head: Atraumatic without abnormalities Oropharynx: Without any thrush or ulcers. Eyes: No scleral icterus. Lymph nodes: No lymphadenopathy noted in the cervical, supraclavicular, or axillary nodes Heart:regular rate and rhythm, without any murmurs or gallops.   Lung: Clear to auscultation without any rhonchi, wheezes or dullness to percussion. Abdomin: Soft, nontender without any shifting dullness or ascites. Musculoskeletal: No clubbing or cyanosis. Neurological: No motor or sensory deficits. Skin: No rashes or lesions.    .   .  Lab Results: Lab Results  Component Value Date   WBC 4.6 06/03/2019   HGB 10.9 (L) 06/03/2019   HCT 35.6 (L) 06/03/2019   MCV 102.6 (H) 06/03/2019   PLT 102 (L) 06/03/2019       Impression and Plan:  53 year old man with  1.  Renal cell carcinoma diagnosed in 2009.  He presented with stage IV disease and isolated pulmonary metastasis.  He has involvement with disease to the bone as  well.     He continues to tolerate Cabometyx 60 mg without any recent concerns.  Imaging studies in June 2020 continues to show stable disease.  The natural course of this disease as well as different salvage therapy options were reiterated.  At this time given his excellent disease control I recommended continuing this treatment approach.  The plan is to repeat imaging studies in December 2020.  Different oral targeted therapy versus immune therapy can be used for salvage options.  2.  Pulmonary embolism: Currently on Xarelto without any issues.  I recommended continuing this indefinitely.  3.  Anemia: His hemoglobin stable without any need for intervention.  His anemia is related to malignancy and treatment.  4.  Diarrhea: Related to Cabometyx but remains manageable with the current medication including Imodium and Lomotil.  5.  Anxiety: Stable with Cymbalta and as needed  anxiolytics.  6.  Prognosis: His disease is incurable although aggressive measures are warranted given his excellent performance status.  7.  Follow-up: In 2 months for repeat evaluation.   25  minutes was spent with the patient face-to-face today.  More than 50% of time was spent on updating his disease status, management options for the future as well as complications related to therapy.     Zola Button, MD 08/09/19

## 2019-08-11 ENCOUNTER — Other Ambulatory Visit: Payer: Self-pay | Admitting: Oncology

## 2019-08-11 MED ORDER — ALPRAZOLAM 1 MG PO TABS: 1.0000 mg | ORAL_TABLET | Freq: Three times a day (TID) | ORAL | 0 refills | Status: DC | PRN

## 2019-08-16 ENCOUNTER — Other Ambulatory Visit: Payer: Self-pay

## 2019-08-16 DIAGNOSIS — K8689 Other specified diseases of pancreas: Secondary | ICD-10-CM

## 2019-08-16 DIAGNOSIS — C642 Malignant neoplasm of left kidney, except renal pelvis: Secondary | ICD-10-CM | POA: Diagnosis not present

## 2019-08-16 DIAGNOSIS — C7951 Secondary malignant neoplasm of bone: Secondary | ICD-10-CM

## 2019-08-16 DIAGNOSIS — C649 Malignant neoplasm of unspecified kidney, except renal pelvis: Secondary | ICD-10-CM

## 2019-08-16 DIAGNOSIS — C7800 Secondary malignant neoplasm of unspecified lung: Secondary | ICD-10-CM

## 2019-08-16 MED ORDER — CABOMETYX 60 MG PO TABS: 60.0000 mg | ORAL_TABLET | Freq: Every day | ORAL | 0 refills | Status: DC

## 2019-08-17 DIAGNOSIS — C642 Malignant neoplasm of left kidney, except renal pelvis: Secondary | ICD-10-CM | POA: Diagnosis not present

## 2019-08-17 DIAGNOSIS — Z9689 Presence of other specified functional implants: Secondary | ICD-10-CM | POA: Diagnosis not present

## 2019-08-17 DIAGNOSIS — I10 Essential (primary) hypertension: Secondary | ICD-10-CM | POA: Diagnosis not present

## 2019-08-17 DIAGNOSIS — G620 Drug-induced polyneuropathy: Secondary | ICD-10-CM | POA: Diagnosis not present

## 2019-08-17 DIAGNOSIS — R6889 Other general symptoms and signs: Secondary | ICD-10-CM | POA: Insufficient documentation

## 2019-08-27 ENCOUNTER — Encounter (HOSPITAL_COMMUNITY): Payer: Self-pay

## 2019-08-27 ENCOUNTER — Inpatient Hospital Stay (HOSPITAL_COMMUNITY)
Admission: EM | Admit: 2019-08-27 | Discharge: 2019-08-31 | DRG: 378 | Disposition: A | Discharge status: Home / Self Care | Payer: Medicare Other | Attending: Internal Medicine | Admitting: Internal Medicine

## 2019-08-27 DIAGNOSIS — Z7901 Long term (current) use of anticoagulants: Secondary | ICD-10-CM

## 2019-08-27 DIAGNOSIS — K922 Gastrointestinal hemorrhage, unspecified: Secondary | ICD-10-CM | POA: Diagnosis present

## 2019-08-27 DIAGNOSIS — I10 Essential (primary) hypertension: Secondary | ICD-10-CM | POA: Diagnosis not present

## 2019-08-27 DIAGNOSIS — K2971 Gastritis, unspecified, with bleeding: Secondary | ICD-10-CM | POA: Diagnosis not present

## 2019-08-27 DIAGNOSIS — C649 Malignant neoplasm of unspecified kidney, except renal pelvis: Secondary | ICD-10-CM | POA: Diagnosis not present

## 2019-08-27 DIAGNOSIS — K2990 Gastroduodenitis, unspecified, without bleeding: Secondary | ICD-10-CM | POA: Diagnosis not present

## 2019-08-27 DIAGNOSIS — K921 Melena: Secondary | ICD-10-CM

## 2019-08-27 DIAGNOSIS — J449 Chronic obstructive pulmonary disease, unspecified: Secondary | ICD-10-CM | POA: Diagnosis not present

## 2019-08-27 DIAGNOSIS — Z794 Long term (current) use of insulin: Secondary | ICD-10-CM

## 2019-08-27 DIAGNOSIS — D62 Acute posthemorrhagic anemia: Secondary | ICD-10-CM | POA: Diagnosis not present

## 2019-08-27 DIAGNOSIS — K228 Other specified diseases of esophagus: Secondary | ICD-10-CM | POA: Diagnosis present

## 2019-08-27 DIAGNOSIS — Z86711 Personal history of pulmonary embolism: Secondary | ICD-10-CM

## 2019-08-27 DIAGNOSIS — C799 Secondary malignant neoplasm of unspecified site: Secondary | ICD-10-CM | POA: Diagnosis not present

## 2019-08-27 DIAGNOSIS — C7889 Secondary malignant neoplasm of other digestive organs: Secondary | ICD-10-CM | POA: Diagnosis present

## 2019-08-27 DIAGNOSIS — Z8249 Family history of ischemic heart disease and other diseases of the circulatory system: Secondary | ICD-10-CM

## 2019-08-27 DIAGNOSIS — Z833 Family history of diabetes mellitus: Secondary | ICD-10-CM

## 2019-08-27 DIAGNOSIS — Z905 Acquired absence of kidney: Secondary | ICD-10-CM | POA: Diagnosis not present

## 2019-08-27 DIAGNOSIS — D649 Anemia, unspecified: Secondary | ICD-10-CM

## 2019-08-27 DIAGNOSIS — E119 Type 2 diabetes mellitus without complications: Secondary | ICD-10-CM

## 2019-08-27 DIAGNOSIS — Z85528 Personal history of other malignant neoplasm of kidney: Secondary | ICD-10-CM | POA: Diagnosis not present

## 2019-08-27 DIAGNOSIS — K649 Unspecified hemorrhoids: Secondary | ICD-10-CM | POA: Diagnosis not present

## 2019-08-27 DIAGNOSIS — Z20828 Contact with and (suspected) exposure to other viral communicable diseases: Secondary | ICD-10-CM | POA: Diagnosis present

## 2019-08-27 DIAGNOSIS — I2699 Other pulmonary embolism without acute cor pulmonale: Secondary | ICD-10-CM | POA: Diagnosis not present

## 2019-08-27 DIAGNOSIS — E1140 Type 2 diabetes mellitus with diabetic neuropathy, unspecified: Secondary | ICD-10-CM | POA: Diagnosis present

## 2019-08-27 DIAGNOSIS — T39395A Adverse effect of other nonsteroidal anti-inflammatory drugs [NSAID], initial encounter: Secondary | ICD-10-CM | POA: Diagnosis present

## 2019-08-27 DIAGNOSIS — Z79891 Long term (current) use of opiate analgesic: Secondary | ICD-10-CM | POA: Diagnosis not present

## 2019-08-27 DIAGNOSIS — K209 Esophagitis, unspecified without bleeding: Secondary | ICD-10-CM

## 2019-08-27 DIAGNOSIS — R079 Chest pain, unspecified: Secondary | ICD-10-CM | POA: Diagnosis not present

## 2019-08-27 DIAGNOSIS — K2970 Gastritis, unspecified, without bleeding: Secondary | ICD-10-CM

## 2019-08-27 DIAGNOSIS — F17210 Nicotine dependence, cigarettes, uncomplicated: Secondary | ICD-10-CM | POA: Diagnosis present

## 2019-08-27 DIAGNOSIS — K3189 Other diseases of stomach and duodenum: Secondary | ICD-10-CM | POA: Diagnosis not present

## 2019-08-27 NOTE — ED Provider Notes (Signed)
[facility] DEPT Provider Note   CSN: [id number] Arrival date & time: 08/27/19  2308     History   Chief Complaint Chief Complaint  Patient presents with  . Rectal Bleeding    HPI Keith [name] is a 53 y.o. male with past medical history of diabetes, pulmonary embolism, stage IV renal cancer with metastases on chemotherapy, and polysubstance abuse who presents to the ER with a 3-day history of burning sensation spanning his throat to his rectum, lightheadedness, and melena.  Patient also reports slight difficulty swallowing, chest pain, and shortness of breath during the same span.  He reports that his stools have been dark black, approximately 2-3/day.  His burning discomfort is slightly improved with sitting up, but also occurs with defecation. He has only taken Tums once or twice, which she states has provided little relief.  Patient admits to tobacco use, but denies any alcohol or illicit drug use.  He denies any cough, fever, chills, nausea, vomiting, urinary symptoms, leg swelling, neuro deficits, or rash.    HPI  Past Medical History:  Diagnosis Date  . Diabetes mellitus without complication ([place])    diet controlled only  . left renal ca d'd 2008  . Malignant neoplasm metastatic to pancreas ([place]) 2012  . 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 Metastasis to bone [facility]) 2014/2016    Patient Active Problem List   Diagnosis Date Noted  . UGI bleed 08/28/2019  . Acute upper GI bleed 08/28/2019  . Metastasis from malignant tumor of kidney ([place]) 02/15/2018  . Absolute anemia   . Pulmonary embolus, left ([place])   . Depression 11/13/2015  . Benign essential HTN 11/13/2015  . Malnutrition of moderate degree 11/12/2015  . HCAP (healthcare-associated pneumonia) 11/11/2015  . Chest pain 11/11/2015  . Acute pulmonary embolism ([place]) 11/11/2015  . Metastasis to lung ([place]) 11/11/2015  . Right middle lobe, bilateral lower lobes pneumonia  11/11/2015  . Acute respiratory  failure with hypoxia ([place]) 11/11/2015  . Chemotherapy induced thrombocytopenia 11/11/2015  . Antineoplastic chemotherapy induced anemia 11/11/2015  . Hyponatremia 11/11/2015  . Cancer related pain 11/11/2015  . Transaminitis 11/11/2015  . Diabetes mellitus with diabetic neuropathy, with long-term current use of insulin ([place]) 11/11/2015  . Therapeutic opioid-induced constipation ([facility]) 11/11/2015  . Bone metastasis ([place]) 12/25/2014  . Polysubstance abuse-cocaine and THC. 04/25/2013  . Renal cancer ([place]) 08/12/2011    Past Surgical History:  Procedure Laterality Date  . COLONOSCOPY W/ POLYPECTOMY    . FEMUR IM NAIL Right 02/16/2018   Procedure: INTRAMEDULLARY (IM) NAIL RIGHT FEMORAL;  Surgeon: Paralee Cancel, MD;  Location: [facility] ORS;  Service: Orthopedics;  Laterality: Right;  . HAND SURGERY Right   . IR ANGIOGRAM EXTREMITY RIGHT  02/15/2018  . IR ANGIOGRAM SELECTIVE EACH ADDITIONAL VESSEL  02/15/2018  . IR ANGIOGRAM SELECTIVE EACH ADDITIONAL VESSEL  02/15/2018  . IR ANGIOGRAM SELECTIVE EACH ADDITIONAL VESSEL  02/15/2018  . IR ANGIOGRAM SELECTIVE EACH ADDITIONAL VESSEL  02/15/2018  . IR EMBO TUMOR ORGAN ISCHEMIA INFARCT INC GUIDE ROADMAPPING  02/15/2018  . IR EMBO TUMOR ORGAN ISCHEMIA INFARCT INC GUIDE ROADMAPPING  02/15/2018  . IR US GUIDE VASC ACCESS LEFT  02/15/2018  . LUNG REMOVAL, PARTIAL Right 09/2008  . NEPHRECTOMY RADICAL     Left   . PAIN PUMP IMPLANTATION N/A 05/16/2015   Procedure: Intrathecal pain pump placement;  Surgeon: Clydell Hakim, MD;  Location: [facility];  Service: Neurosurgery;  Laterality: N/A;  Intrathecal pain pump placement  Home Medications    Prior to Admission medications   Medication Sig Start Date End Date Taking? Authorizing Provider  ALPRAZolam Duanne Moron) 1 MG tablet Take 1 tablet (1 mg total) by mouth 3 (three) times daily as needed for anxiety. 08/11/19  Yes [name], MD  calcium carbonate (TUMS - DOSED IN MG ELEMENTAL CALCIUM) 500 MG chewable  tablet Chew 1 tablet by mouth 3 (three) times daily as needed for indigestion or heartburn.    Yes [provider]  DULoxetine (CYMBALTA) 60 MG capsule Take 1 capsule (60 mg total) by mouth 2 (two) times daily. 04/06/19  Yes [name], MD  HYDROmorphone (DILAUDID) 8 MG tablet Take 1 tablet (8 mg total) by mouth every 4 (four) hours as needed for severe pain. 02/17/18  Yes Babish, Rodman Key, PA-C  hydroxypropyl methylcellulose / hypromellose (ISOPTO TEARS / GONIOVISC) 2.5 % ophthalmic solution Place 1 drop into both eyes 3 (three) times daily as needed for dry eyes.   Yes [provider]  ibuprofen (ADVIL) 200 MG tablet Take 400 mg by mouth every 6 (six) hours as needed for moderate pain.   Yes [provider]  loratadine (CLARITIN) 10 MG tablet Take 10 mg by mouth daily.   Yes [provider]  rivaroxaban (XARELTO) 20 MG TABS tablet Take 1 tablet (20 mg total) by mouth daily with supper. 04/06/19  Yes [name], MD  tiZANidine (ZANAFLEX) 4 MG tablet Take 1 tablet (4 mg total) by mouth every 8 (eight) hours as needed for muscle spasms. 02/17/18  Yes Babish, Rodman Key, PA-C  cabozantinib (CABOMETYX) 60 MG tablet Take 1 tablet (60 mg total) by mouth daily. With at least 8 ounces of water on an empty stomach. Do not eat for 2 hours before or 1 hour after. Swallow whole. 08/16/19   [name], MD  diphenoxylate-atropine (LOMOTIL) 2.5-0.025 MG tablet Take 1 tablet by mouth 4 (four) times daily as needed for diarrhea or loose stools. Patient not taking: Reported on 08/28/2019 08/25/18   [name], MD  ferrous sulfate 325 (65 FE) MG tablet Take 1 tablet (325 mg total) by mouth 3 (three) times daily after meals. Patient not taking: Reported on 08/28/2019 02/17/18   Danae Orleans, PA-C  furosemide (LASIX) 20 MG tablet Take 1 tablet (20 mg total) by mouth daily as needed. Patient not taking: Reported on 08/28/2019 11/05/18   [name], MD  furosemide (LASIX) 20 MG  tablet TAKE 1 TABLET BY MOUTH EVERY DAY AS NEEDED Patient not taking: Reported on 08/28/2019 12/01/18   [name], MD  gabapentin (NEURONTIN) 300 MG capsule TAKE 1 CAPSULE BY MOUTH THREE TIMES A DAY Patient taking differently: Take 300 mg by mouth 3 (three) times daily.  07/04/19   [name], MD  megestrol (MEGACE) 40 MG/ML suspension TAKE 10 MLS (400 MG TOTAL) BY MOUTH 2 (TWO) TIMES DAILY. Patient not taking: Reported on 08/28/2019 07/18/19   [name], MD    Family History Family History  Problem Relation Age of Onset  . Hypertension Mother   . Diabetes Brother   . Irritable bowel syndrome Sister   . Cancer Maternal Aunt        ovarian  . Colon cancer Neg Hx     Social History Social History   Tobacco Use  . Smoking status: Current Every Day Smoker    Packs/day: 1.00    Years: 30.00    Pack years: 30.00  Types: Cigarettes  . Smokeless tobacco: Never Used  Substance Use Topics  . Alcohol use: No  . Drug use: Yes    Types: Marijuana    Comment: Daily. Last used: last night.      Allergies   Ceftriaxone and Hydrocodone   Review of Systems Review of Systems Ten systems are reviewed and are negative for acute change except as noted in the HPI   Physical Exam Updated Vital Signs BP 107/85   Pulse 99   Temp 98.2 F (36.8 C) (Oral)   Resp 20   SpO2 99%   Physical Exam Constitutional:      Appearance: Normal appearance.  HENT:     Head: Normocephalic and atraumatic.     Mouth/Throat:     Pharynx: No oropharyngeal exudate.  Eyes:     General: No scleral icterus.    Conjunctiva/sclera: Conjunctivae normal.  Cardiovascular:     Rate and Rhythm: Regular rhythm. Tachycardia present.     Pulses: Normal pulses.  Pulmonary:     Effort: Pulmonary effort is normal.     Breath sounds: Normal breath sounds.  Abdominal:     General: Abdomen is flat. There is no distension.     Palpations: Abdomen is soft.     Tenderness: There is abdominal  tenderness. There is no guarding.     Comments: Tenderness to palpation, particularly in the epigastric, left upper, and left lower quadrant.  No guarding.  Mass in the left lower quadrant is his implanted morphine pain pump.  Neurological:     General: No focal deficit present.     Mental Status: He is alert and oriented to person, place, and time.     GCS: GCS eye subscore is 4. GCS verbal subscore is 5. GCS motor subscore is 6.  Psychiatric:        Mood and Affect: Mood normal.        Behavior: Behavior normal.        Thought Content: Thought content normal.      ED Treatments / Results  Labs (all labs ordered are listed, but only abnormal results are displayed) Labs Reviewed  COMPREHENSIVE METABOLIC PANEL - Abnormal; Notable for the following components:      Result Value   Glucose, Bld 117 (*)    BUN 29 (*)    Creatinine, Ser 1.33 (*)    Calcium 8.2 (*)    Total Protein 5.8 (*)    Albumin 2.8 (*)    AST 14 (*)    All other components within normal limits  CBC - Abnormal; Notable for the following components:   RBC 1.90 (*)    Hemoglobin 6.2 (*)    HCT 20.6 (*)    MCV 108.4 (*)    RDW 17.0 (*)    Platelets 134 (*)    All other components within normal limits  PROTIME-INR - Abnormal; Notable for the following components:   Prothrombin Time 15.4 (*)    All other components within normal limits  POC OCCULT BLOOD, ED - Abnormal; Notable for the following components:   Fecal Occult Bld POSITIVE (*)    All other components within normal limits  TROPONIN I (HIGH SENSITIVITY) - Abnormal; Notable for the following components:   Troponin I (High Sensitivity) 19 (*)    All other components within normal limits  SARS CORONAVIRUS 2 (HOSPITAL ORDER, [facility])  MRSA PCR SCREENING  LIPASE, BLOOD  APTT  HIV ANTIBODY (ROUTINE TESTING  W REFLEX)  TYPE AND SCREEN  PREPARE RBC (CROSSMATCH)  TROPONIN I (HIGH SENSITIVITY)    EKG EKG  Interpretation  Date/Time:  Sunday August 28 2019 00:28:58 EDT Ventricular Rate:  109 PR Interval:    QRS Duration: 86 QT Interval:  325 QTC Calculation: 438 R Axis:   75 Text Interpretation:  Sinus tachycardia Borderline repolarization abnormality No acute changes No significant change since last tracing Sinus tachycardia Confirmed by Varney Biles ([id number]) on 08/28/2019 1:58:24 AM   Radiology Dg Chest 2 View  Result Date: 08/28/2019 CLINICAL DATA:  Chest pain EXAM: CHEST - 2 VIEW COMPARISON:  11/10/2015 FINDINGS: Heart is normal size. Small right pleural effusions suspected. There is hyperinflation of the lungs compatible with COPD. Nodular airspace opacity in the right upper lobe is similar to prior study and CT. No acute confluent airspace opacity. No acute bony abnormality. IMPRESSION: COPD/chronic changes. Small right pleural effusion. Electronically Signed   By: Rolm Baptise M.D.   On: 08/28/2019 00:50    Procedures .Critical Care Performed by: Corena Herter, PA-C Authorized by: Corena Herter, PA-C   Critical care provider statement:    Critical care time (minutes):  45   Critical care time was exclusive of:  Separately billable procedures and treating other patients and teaching time   Critical care was necessary to treat or prevent imminent or life-threatening deterioration of the following conditions:  Circulatory failure   Critical care was time spent personally by me on the following activities:  Discussions with consultants, evaluation of patient's response to treatment, examination of patient, ordering and performing treatments and interventions, ordering and review of laboratory studies, ordering and review of radiographic studies, pulse oximetry, re-evaluation of patient's condition, obtaining history from patient or surrogate and review of old charts   I assumed direction of critical care for this patient from another provider in my specialty: no     (including  critical care time)  Medications Ordered in ED Medications  pantoprazole (PROTONIX) 80 mg in sodium chloride 0.9 % 250 mL (0.32 mg/mL) infusion (8 mg/hr Intravenous New Bag/Given 08/28/19 0250)  0.9 %  sodium chloride infusion (Manually program via Guardrails IV Fluids) (has no administration in time range)  HYDROmorphone (DILAUDID) tablet 8 mg (has no administration in time range)  ALPRAZolam (XANAX) tablet 1 mg (has no administration in time range)  DULoxetine (CYMBALTA) DR capsule 60 mg (has no administration in time range)  calcium carbonate (TUMS - dosed in mg elemental calcium) chewable tablet 200 mg of elemental calcium (has no administration in time range)  gabapentin (NEURONTIN) capsule 300 mg (has no administration in time range)  tiZANidine (ZANAFLEX) tablet 4 mg (has no administration in time range)  polyvinyl alcohol (LIQUIFILM TEARS) 1.4 % ophthalmic solution 1 drop (has no administration in time range)  0.9 %  sodium chloride infusion (has no administration in time range)  acetaminophen (TYLENOL) tablet 650 mg (has no administration in time range)    Or  acetaminophen (TYLENOL) suppository 650 mg (has no administration in time range)  0.9 %  sodium chloride infusion (Manually program via Guardrails IV Fluids) (has no administration in time range)  furosemide (LASIX) injection 20 mg (has no administration in time range)  Chlorhexidine Gluconate Cloth 2 % PADS 6 each (has no administration in time range)  pantoprazole (PROTONIX) 80 mg in sodium chloride 0.9 % 100 mL IVPB (0 mg Intravenous Stopped 08/28/19 0405)     Initial Impression / Assessment and Plan / ED Course  I have reviewed the triage vital signs and the nursing notes.  Pertinent labs & imaging results that were available during my care of the patient were reviewed by me and considered in my medical decision making (see chart for details).  Clinical Course as of Aug 27 534  Sun Aug 28, 2019  0145 Hemoglobin(!!): 6.2  [GG]    Clinical Course User Index [GG] Corena Herter, PA-C      Patient's hemoglobin is 6.2.  Fecal occult positive.  Suspect anemia due to upper GI bleed.  Spoke with patient about risks and benefits of blood transfusion and patient was willing to proceed.  Protonix was administered.  Will admit patient to hospital.  Reviewed and interpreted chest x-ray which revealed a small right pleural effusion in addition to chronic changes from his COPD. Reviewed EKG which only demonstrated a sinus tachycardia. Patient's initial high-sensitivity troponin was mildly elevated, but in the clinical context more likely due to his anemia causing strain.     Final Clinical Impressions(s) / ED Diagnoses   Final diagnoses:  Symptomatic anemia  Acute upper GI bleed    ED Discharge Orders    None       Corena Herter, PA-C 08/28/19 [place], Ankit, MD 08/28/19 [phone]

## 2019-08-27 NOTE — ED Triage Notes (Addendum)
Pt reports a burning feeling in his throat down to his rectum. States that it hurts to swallow. He reports a black BM this morning. Also reports that he feels weak and SOB. Pt appears very pale.

## 2019-08-28 ENCOUNTER — Encounter (HOSPITAL_COMMUNITY): Payer: Self-pay

## 2019-08-28 ENCOUNTER — Other Ambulatory Visit: Payer: Self-pay

## 2019-08-28 ENCOUNTER — Emergency Department (HOSPITAL_COMMUNITY): Payer: Medicare Other

## 2019-08-28 ENCOUNTER — Inpatient Hospital Stay (HOSPITAL_COMMUNITY): Payer: Medicare Other | Admitting: Anesthesiology

## 2019-08-28 ENCOUNTER — Encounter (HOSPITAL_COMMUNITY)
Admission: EM | Disposition: A | Discharge status: Home / Self Care | Payer: Self-pay | Source: Home / Self Care | Attending: Internal Medicine

## 2019-08-28 DIAGNOSIS — R079 Chest pain, unspecified: Secondary | ICD-10-CM | POA: Diagnosis not present

## 2019-08-28 DIAGNOSIS — B9681 Helicobacter pylori [H. pylori] as the cause of diseases classified elsewhere: Secondary | ICD-10-CM | POA: Diagnosis not present

## 2019-08-28 DIAGNOSIS — K2990 Gastroduodenitis, unspecified, without bleeding: Secondary | ICD-10-CM | POA: Diagnosis present

## 2019-08-28 DIAGNOSIS — Z7901 Long term (current) use of anticoagulants: Secondary | ICD-10-CM | POA: Diagnosis not present

## 2019-08-28 DIAGNOSIS — C799 Secondary malignant neoplasm of unspecified site: Secondary | ICD-10-CM | POA: Diagnosis not present

## 2019-08-28 DIAGNOSIS — K228 Other specified diseases of esophagus: Secondary | ICD-10-CM | POA: Diagnosis not present

## 2019-08-28 DIAGNOSIS — K2970 Gastritis, unspecified, without bleeding: Secondary | ICD-10-CM

## 2019-08-28 DIAGNOSIS — K2950 Unspecified chronic gastritis without bleeding: Secondary | ICD-10-CM | POA: Diagnosis not present

## 2019-08-28 DIAGNOSIS — K649 Unspecified hemorrhoids: Secondary | ICD-10-CM | POA: Diagnosis present

## 2019-08-28 DIAGNOSIS — K2971 Gastritis, unspecified, with bleeding: Secondary | ICD-10-CM | POA: Diagnosis not present

## 2019-08-28 DIAGNOSIS — K449 Diaphragmatic hernia without obstruction or gangrene: Secondary | ICD-10-CM | POA: Diagnosis not present

## 2019-08-28 DIAGNOSIS — K922 Gastrointestinal hemorrhage, unspecified: Secondary | ICD-10-CM | POA: Diagnosis not present

## 2019-08-28 DIAGNOSIS — K3189 Other diseases of stomach and duodenum: Secondary | ICD-10-CM

## 2019-08-28 DIAGNOSIS — C7889 Secondary malignant neoplasm of other digestive organs: Secondary | ICD-10-CM | POA: Diagnosis not present

## 2019-08-28 DIAGNOSIS — K209 Esophagitis, unspecified without bleeding: Secondary | ICD-10-CM

## 2019-08-28 DIAGNOSIS — I10 Essential (primary) hypertension: Secondary | ICD-10-CM | POA: Diagnosis not present

## 2019-08-28 DIAGNOSIS — I2699 Other pulmonary embolism without acute cor pulmonale: Secondary | ICD-10-CM | POA: Diagnosis not present

## 2019-08-28 DIAGNOSIS — Z85528 Personal history of other malignant neoplasm of kidney: Secondary | ICD-10-CM | POA: Diagnosis not present

## 2019-08-28 DIAGNOSIS — Z86711 Personal history of pulmonary embolism: Secondary | ICD-10-CM | POA: Diagnosis not present

## 2019-08-28 DIAGNOSIS — Z905 Acquired absence of kidney: Secondary | ICD-10-CM | POA: Diagnosis not present

## 2019-08-28 DIAGNOSIS — Z794 Long term (current) use of insulin: Secondary | ICD-10-CM | POA: Diagnosis not present

## 2019-08-28 DIAGNOSIS — Z833 Family history of diabetes mellitus: Secondary | ICD-10-CM | POA: Diagnosis not present

## 2019-08-28 DIAGNOSIS — D62 Acute posthemorrhagic anemia: Secondary | ICD-10-CM | POA: Diagnosis not present

## 2019-08-28 DIAGNOSIS — C649 Malignant neoplasm of unspecified kidney, except renal pelvis: Secondary | ICD-10-CM | POA: Diagnosis not present

## 2019-08-28 DIAGNOSIS — Z79891 Long term (current) use of opiate analgesic: Secondary | ICD-10-CM | POA: Diagnosis not present

## 2019-08-28 DIAGNOSIS — Z20828 Contact with and (suspected) exposure to other viral communicable diseases: Secondary | ICD-10-CM | POA: Diagnosis not present

## 2019-08-28 DIAGNOSIS — J449 Chronic obstructive pulmonary disease, unspecified: Secondary | ICD-10-CM | POA: Diagnosis not present

## 2019-08-28 DIAGNOSIS — Z8249 Family history of ischemic heart disease and other diseases of the circulatory system: Secondary | ICD-10-CM | POA: Diagnosis not present

## 2019-08-28 DIAGNOSIS — K921 Melena: Secondary | ICD-10-CM

## 2019-08-28 DIAGNOSIS — D649 Anemia, unspecified: Secondary | ICD-10-CM

## 2019-08-28 DIAGNOSIS — E1140 Type 2 diabetes mellitus with diabetic neuropathy, unspecified: Secondary | ICD-10-CM | POA: Diagnosis not present

## 2019-08-28 DIAGNOSIS — K219 Gastro-esophageal reflux disease without esophagitis: Secondary | ICD-10-CM | POA: Diagnosis not present

## 2019-08-28 HISTORY — PX: ESOPHAGOGASTRODUODENOSCOPY (EGD) WITH PROPOFOL: SHX5813

## 2019-08-28 HISTORY — PX: BIOPSY: SHX5522

## 2019-08-28 LAB — COMPREHENSIVE METABOLIC PANEL
ALT: 17 U/L (ref 0–44)
AST: 14 U/L — ABNORMAL LOW (ref 15–41)
Albumin: 2.8 g/dL — ABNORMAL LOW (ref 3.5–5.0)
Alkaline Phosphatase: 64 U/L (ref 38–126)
Anion gap: 10 (ref 5–15)
BUN: 29 mg/dL — ABNORMAL HIGH (ref 6–20)
CO2: 24 mmol/L (ref 22–32)
Calcium: 8.2 mg/dL — ABNORMAL LOW (ref 8.9–10.3)
Chloride: 106 mmol/L (ref 98–111)
Creatinine, Ser: 1.33 mg/dL — ABNORMAL HIGH (ref 0.61–1.24)
GFR calc Af Amer: >60 mL/min (ref >60)
GFR calc non Af Amer: >60 mL/min (ref >60)
Glucose, Bld: 117 mg/dL — ABNORMAL HIGH (ref 70–99)
Potassium: 3.9 mmol/L (ref 3.5–5.1)
Sodium: 140 mmol/L (ref 135–145)
Total Bilirubin: 0.4 mg/dL (ref 0.3–1.2)
Total Protein: 5.8 g/dL — ABNORMAL LOW (ref 6.5–8.1)

## 2019-08-28 LAB — LIPASE, BLOOD: Lipase: 17 U/L (ref 11–51)

## 2019-08-28 LAB — APTT: aPTT: 27 seconds (ref 24–36)

## 2019-08-28 LAB — CBC
HCT: 20.6 % — ABNORMAL LOW (ref 39.0–52.0)
Hemoglobin: 6.2 g/dL — CL (ref 13.0–17.0)
MCH: 32.6 pg (ref 26.0–34.0)
MCHC: 30.1 g/dL (ref 30.0–36.0)
MCV: 108.4 fL — ABNORMAL HIGH (ref 80.0–100.0)
Platelets: 134 10*3/uL — ABNORMAL LOW (ref 150–400)
RBC: 1.9 MIL/uL — ABNORMAL LOW (ref 4.22–5.81)
RDW: 17 % — ABNORMAL HIGH (ref 11.5–15.5)
WBC: 8.6 10*3/uL (ref 4.0–10.5)
nRBC: 0 % (ref 0.0–0.2)

## 2019-08-28 LAB — TROPONIN I (HIGH SENSITIVITY)
Troponin I (High Sensitivity): 19 ng/L — ABNORMAL HIGH (ref <18)
Troponin I (High Sensitivity): 14 ng/L (ref <18)

## 2019-08-28 LAB — SARS CORONAVIRUS 2 BY RT PCR (HOSPITAL ORDER, PERFORMED IN ~~LOC~~ HOSPITAL LAB): SARS Coronavirus 2: NEGATIVE

## 2019-08-28 LAB — PREPARE RBC (CROSSMATCH)

## 2019-08-28 LAB — MRSA PCR SCREENING: MRSA by PCR: POSITIVE — AB

## 2019-08-28 LAB — PROTIME-INR
INR: 1.2 (ref 0.8–1.2)
Prothrombin Time: 15.4 seconds — ABNORMAL HIGH (ref 11.4–15.2)

## 2019-08-28 LAB — HEMOGLOBIN AND HEMATOCRIT, BLOOD
HCT: 27.1 % — ABNORMAL LOW (ref 39.0–52.0)
Hemoglobin: 8.9 g/dL — ABNORMAL LOW (ref 13.0–17.0)

## 2019-08-28 LAB — POC OCCULT BLOOD, ED: Fecal Occult Bld: POSITIVE — AB

## 2019-08-28 SURGERY — ESOPHAGOGASTRODUODENOSCOPY (EGD) WITH PROPOFOL
Anesthesia: Monitor Anesthesia Care

## 2019-08-28 MED ORDER — ONDANSETRON HCL 4 MG/2ML IJ SOLN
4.0000 mg | Freq: Four times a day (QID) | INTRAMUSCULAR | Status: DC | PRN
  Administered 2019-08-28: 4 mg via INTRAVENOUS
  Filled 2019-08-28: qty 2

## 2019-08-28 MED ORDER — CABOZANTINIB S-MALATE 60 MG PO TABS: 60.0000 mg | ORAL_TABLET | Freq: Every day | ORAL | Status: DC

## 2019-08-28 MED ORDER — CHLORHEXIDINE GLUCONATE CLOTH 2 % EX PADS
6.0000 | MEDICATED_PAD | Freq: Every day | CUTANEOUS | Status: DC
  Administered 2019-08-31: 6 via TOPICAL

## 2019-08-28 MED ORDER — SODIUM CHLORIDE 0.9 % IV SOLN
8.0000 mg/h | INTRAVENOUS | Status: AC
  Administered 2019-08-28 – 2019-08-30 (×7): 8 mg/h via INTRAVENOUS
  Filled 2019-08-28 (×8): qty 80

## 2019-08-28 MED ORDER — PROPOFOL 10 MG/ML IV BOLUS
INTRAVENOUS | Status: DC | PRN
  Administered 2019-08-28 (×4): 20 mg via INTRAVENOUS

## 2019-08-28 MED ORDER — CHLORHEXIDINE GLUCONATE CLOTH 2 % EX PADS
6.0000 | MEDICATED_PAD | Freq: Every day | CUTANEOUS | Status: DC
  Administered 2019-08-30: 6 via TOPICAL

## 2019-08-28 MED ORDER — ALPRAZOLAM 1 MG PO TABS: 1.0000 mg | ORAL_TABLET | Freq: Three times a day (TID) | ORAL | Status: DC | PRN

## 2019-08-28 MED ORDER — MUPIROCIN 2 % EX OINT
1.0000 "application " | TOPICAL_OINTMENT | Freq: Two times a day (BID) | CUTANEOUS | Status: DC
  Administered 2019-08-28 – 2019-08-30 (×6): 1 via NASAL
  Filled 2019-08-28 (×2): qty 22

## 2019-08-28 MED ORDER — GABAPENTIN 300 MG PO CAPS
300.0000 mg | ORAL_CAPSULE | Freq: Three times a day (TID) | ORAL | Status: DC
  Administered 2019-08-28 – 2019-08-31 (×10): 300 mg via ORAL
  Filled 2019-08-28 (×10): qty 1

## 2019-08-28 MED ORDER — CALCIUM CARBONATE ANTACID 500 MG PO CHEW: 1.0000 | CHEWABLE_TABLET | Freq: Three times a day (TID) | ORAL | Status: DC | PRN

## 2019-08-28 MED ORDER — SODIUM CHLORIDE 0.9 % IV SOLN
INTRAVENOUS | Status: DC
  Administered 2019-08-28: 11:00:00 via INTRAVENOUS

## 2019-08-28 MED ORDER — SODIUM CHLORIDE 0.9% IV SOLUTION: Freq: Once | INTRAVENOUS | Status: DC

## 2019-08-28 MED ORDER — PROPOFOL 500 MG/50ML IV EMUL
INTRAVENOUS | Status: DC | PRN
  Administered 2019-08-28: 80 ug/kg/min via INTRAVENOUS

## 2019-08-28 MED ORDER — PROPOFOL 10 MG/ML IV BOLUS
INTRAVENOUS | Status: AC
  Filled 2019-08-28: qty 40

## 2019-08-28 MED ORDER — HYDROMORPHONE HCL 2 MG PO TABS: 8.0000 mg | ORAL_TABLET | ORAL | Status: DC | PRN

## 2019-08-28 MED ORDER — TIZANIDINE HCL 4 MG PO TABS: 4.0000 mg | ORAL_TABLET | Freq: Three times a day (TID) | ORAL | Status: DC | PRN

## 2019-08-28 MED ORDER — POLYVINYL ALCOHOL 1.4 % OP SOLN: 1.0000 [drp] | Freq: Three times a day (TID) | OPHTHALMIC | Status: DC | PRN

## 2019-08-28 MED ORDER — FUROSEMIDE 10 MG/ML IJ SOLN
20.0000 mg | Freq: Once | INTRAMUSCULAR | Status: AC
  Administered 2019-08-28: 20 mg via INTRAVENOUS
  Filled 2019-08-28: qty 2

## 2019-08-28 MED ORDER — DULOXETINE HCL 30 MG PO CPEP
60.0000 mg | ORAL_CAPSULE | Freq: Two times a day (BID) | ORAL | Status: DC
  Administered 2019-08-28 – 2019-08-31 (×7): 60 mg via ORAL
  Filled 2019-08-28 (×7): qty 2

## 2019-08-28 MED ORDER — ACETAMINOPHEN 650 MG RE SUPP: 650.0000 mg | Freq: Four times a day (QID) | RECTAL | Status: DC | PRN

## 2019-08-28 MED ORDER — SODIUM CHLORIDE 0.9 % IV SOLN
INTRAVENOUS | Status: AC
  Administered 2019-08-28 – 2019-08-29 (×3): via INTRAVENOUS

## 2019-08-28 MED ORDER — SODIUM CHLORIDE 0.9 % IV SOLN
80.0000 mg | Freq: Once | INTRAVENOUS | Status: AC
  Administered 2019-08-28: 80 mg via INTRAVENOUS
  Filled 2019-08-28: qty 80

## 2019-08-28 MED ORDER — ACETAMINOPHEN 325 MG PO TABS: 650.0000 mg | ORAL_TABLET | Freq: Four times a day (QID) | ORAL | Status: DC | PRN

## 2019-08-28 SURGICAL SUPPLY — 15 items

## 2019-08-28 NOTE — ED Notes (Addendum)
Date and time results received: 08/28/19 0139  Test: Hgb Critical Value: 6.2  Name of Provider Notified: Donna Christen EDPA  Orders Received? Or Actions Taken?: see orders

## 2019-08-28 NOTE — Op Note (Addendum)
[facility] Patient Name: Keith [name] Procedure Date: 08/28/2019 MRN: [id number] Attending MD: Thornton Park MD, MD Date of Birth: [birth year] CSN: [id number] Age: 54 Admit Type: Inpatient Procedure:                Upper GI endoscopy Indications:              Acute post hemorrhagic anemia, Melena Providers:                Thornton Park MD, MD, Jeanella Cara, RN,                            William Dalton, Technician Referring MD:              Medicines:                See the Anesthesia note for documentation of the                            administered medications Complications:            No immediate complications. Estimated blood loss:                            Minimal. Estimated Blood Loss:     Estimated blood loss was minimal. Procedure:                Pre-Anesthesia Assessment:                           - Prior to the procedure, a History and Physical                            was performed, and patient medications and                            allergies were reviewed. The patient's tolerance of                            previous anesthesia was also reviewed. The risks                            and benefits of the procedure and the sedation                            options and risks were discussed with the patient.                            All questions were answered, and informed consent                            was obtained. Prior Anticoagulants: The patient has                            taken Xarelto (rivaroxaban), last dose was 5 days  prior to procedure. ASA Grade Assessment: III - A                            patient with severe systemic disease. After                            reviewing the risks and benefits, the patient was                            deemed in satisfactory condition to undergo the                            procedure.                           After obtaining informed consent, the  endoscope was                            passed under direct vision. Throughout the                            procedure, the patient's blood pressure, pulse, and                            oxygen saturations were monitored continuously. The                            GIF-H190 ([id number]) Olympus gastroscope was                            introduced through the mouth, and advanced to the                            second part of duodenum. The upper GI endoscopy was                            accomplished without difficulty. The patient                            tolerated the procedure well. Scope In: Scope Out: Findings:      LA Grade C (one or more mucosal breaks continuous between tops of 2 or       more mucosal folds, less than 75% circumference) esophagitis with       evidence for recent bleeding was found. The distal esophagus also had a       ringed appearance. There was no active bleeding at the time of my       examination. Biopsies were taken with a cold forceps for histology from       the distal esophagus as well as from the mid and proximal esophagus.       Estimated blood loss: Minimal.      Multiple localized, small non-bleeding erosions were found in the       gastric antrum. There were no stigmata of recent bleeding. Biopsies were       taken with a cold  forceps for histology.      A medium-sized hiatal hernia was present.      The examined duodenum was normal. Impression:               - LA Grade C esophagitis. Biopsied. The likely                            source of bleeding in the setting of Xaralto.                           - Non-bleeding erosive gastropathy. Biopsied.                           - Hiatal hernia.                           - Normal examined duodenum.                           - No evidence for recent or active bleeding. Moderate Sedation:      Not Applicable - Patient had care per Anesthesia. Recommendation:           - Await pathology results.                            - Return patient to hospital ward for ongoing care.                           - Continue serial hgb/hct with transfusion as                            indicated.                           - Pantoprazole 40 mg twice daily x 8 weeks.                           - Carafate 1g slurry QID x 2 weeks.                           - Avoid all NSAIDs.                           - Screening colonoscopy as an outpatient. Last                            colonoscopy was 2009.                           Results discussed with the patient in the recovery                            room and with his wife by telephone at [phone]. Procedure Code(s):        --- Professional ---  62831, Esophagogastroduodenoscopy, flexible,                            transoral; with biopsy, single or multiple Diagnosis Code(s):        --- Professional ---                           K20.9, Esophagitis, unspecified                           K31.89, Other diseases of stomach and duodenum                           K44.9, Diaphragmatic hernia without obstruction or                            gangrene                           D62, Acute posthemorrhagic anemia                           K92.1, Melena (includes Hematochezia) CPT copyright 2019 American Medical Association. All rights reserved. The codes documented in this report are preliminary and upon coder review may  be revised to meet current compliance requirements. Thornton Park MD, MD 08/28/2019 12:22:47 PM This report has been signed electronically. Number of Addenda: 0

## 2019-08-28 NOTE — Progress Notes (Signed)
53 yo M presenting with UGIB. See H&P for full details. Getting pRBCs transfused. S/p EGD. Monitor Hgb. Remainder as per H&P.   Marland KitchenGeneral: 53 y.o. male resting in bed in NAD Eyes: PERRL, normal sclera ENMT: Nares patent w/o discharge, orophaynx clear, dentition normal, ears w/o discharge/lesions/ulcers Cardiovascular: RRR, +S1, S2, no m/g/r, equal pulses throughout Respiratory: CTABL, no w/r/r, normal WOB GI: BS+, NDNT, no masses noted, no organomegaly noted MSK: No e/c/c Skin: No rashes, bruises, ulcerations noted Neuro: A&O x 3, no focal deficits  A/P Upper GI bleed Renal cell carcinoma with metastatic disease Hypertension History of pulmonary embolus Diabetes

## 2019-08-28 NOTE — Consult Note (Signed)
Referring Provider:  ?? Primary Care Physician:  [name], MD Primary Gastroenterologist:  Dr. Ardis Hughs  Reason for Consultation:  GI bleed  HPI: Keith [name] is a 53 y.o. male with past medical history is significant for renal cell carcinoma with metastatic disease initially diagnosed in 2009.  Follows with Dr. [name] and last seen in August at which time things were stable.  Also has diabetes mellitus.  He presented to [facility] with complaints of black stools, dizziness, and weakness.  He tells me that on Tuesday he started passing black stools.  Wednesday he did not have a bowel movement at all and Thursday he had bowel movements, but says that they were just like water.  Last time he passed any black stools was on Friday.  He began feeling dizzy and lightheaded.  Noticed that he was very pale when he looked in the mirror and that prompted his emergency room visit.  He does take Xarelto at home for history of pulmonary embolism and says his last dose was on Thursday.  Upon evaluation in the emergency department he was found to have hemoglobin of 6.2 g.  2 units of packed red blood cells were ordered.  He was Hemoccult positive.   Was placed on PPI drip and GI was consulted.  He is currently receiving his second unit of packed red blood cells as we speak.  He says that he is feeling better after getting blood.  He reports crampy lower abdominal pain that radiated into his groin, but resolved once he had a bowel movement or passes gas.  Admits to occasional ibuprofen use, but nothing on a regular basis.  Says that he will go weeks at a time without taking any.  No nausea or vomiting.  Colonoscopy 11/2008 by Dr. Lajoyce Corners showed medium sized internal hemorrhoids.   Past Medical History:  Diagnosis Date  . Diabetes mellitus without complication ([place])    diet controlled only  . left renal ca d'd 2008  . 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  Metastasis to bone [facility]) 2014/2016    Past Surgical History:  Procedure Laterality Date  . COLONOSCOPY W/ POLYPECTOMY    . FEMUR IM NAIL Right 02/16/2018   Procedure: INTRAMEDULLARY (IM) NAIL RIGHT FEMORAL;  Surgeon: Paralee Cancel, MD;  Location: [facility];  Service: Orthopedics;  Laterality: Right;  . HAND SURGERY Right   . IR ANGIOGRAM EXTREMITY RIGHT  02/15/2018  . IR ANGIOGRAM SELECTIVE EACH ADDITIONAL VESSEL  02/15/2018  . IR ANGIOGRAM SELECTIVE EACH ADDITIONAL VESSEL  02/15/2018  . IR ANGIOGRAM SELECTIVE EACH ADDITIONAL VESSEL  02/15/2018  . IR ANGIOGRAM SELECTIVE EACH ADDITIONAL VESSEL  02/15/2018  . IR EMBO TUMOR ORGAN ISCHEMIA INFARCT INC GUIDE ROADMAPPING  02/15/2018  . IR EMBO TUMOR ORGAN ISCHEMIA INFARCT INC GUIDE ROADMAPPING  02/15/2018  . IR US GUIDE VASC ACCESS LEFT  02/15/2018  . LUNG REMOVAL, PARTIAL Right 09/2008  . NEPHRECTOMY RADICAL     Left   . PAIN PUMP IMPLANTATION N/A 05/16/2015   Procedure: Intrathecal pain pump placement;  Surgeon: Clydell Hakim, MD;  Location: [place] [facility];  Service: Neurosurgery;  Laterality: N/A;  Intrathecal pain pump placement    Prior to Admission medications   Medication Sig Start Date End Date Taking? Authorizing Provider  ALPRAZolam Duanne Moron) 1 MG tablet Take 1 tablet (1 mg total) by mouth 3 (three) times daily as needed for anxiety. 08/11/19  Yes Zola Button  N, MD  calcium carbonate (TUMS - DOSED IN MG ELEMENTAL CALCIUM) 500 MG chewable tablet Chew 1 tablet by mouth 3 (three) times daily as needed for indigestion or heartburn.    Yes [provider]  DULoxetine (CYMBALTA) 60 MG capsule Take 1 capsule (60 mg total) by mouth 2 (two) times daily. 04/06/19  Yes [name], MD  HYDROmorphone (DILAUDID) 8 MG tablet Take 1 tablet (8 mg total) by mouth every 4 (four) hours as needed for severe pain. 02/17/18  Yes Babish, Rodman Key, PA-C  hydroxypropyl methylcellulose / hypromellose (ISOPTO TEARS / GONIOVISC) 2.5 % ophthalmic solution Place 1 drop  into both eyes 3 (three) times daily as needed for dry eyes.   Yes [provider]  ibuprofen (ADVIL) 200 MG tablet Take 400 mg by mouth every 6 (six) hours as needed for moderate pain.   Yes [provider]  loratadine (CLARITIN) 10 MG tablet Take 10 mg by mouth daily.   Yes [provider]  rivaroxaban (XARELTO) 20 MG TABS tablet Take 1 tablet (20 mg total) by mouth daily with supper. 04/06/19  Yes [name], MD  tiZANidine (ZANAFLEX) 4 MG tablet Take 1 tablet (4 mg total) by mouth every 8 (eight) hours as needed for muscle spasms. 02/17/18  Yes Babish, Rodman Key, PA-C  cabozantinib (CABOMETYX) 60 MG tablet Take 1 tablet (60 mg total) by mouth daily. With at least 8 ounces of water on an empty stomach. Do not eat for 2 hours before or 1 hour after. Swallow whole. 08/16/19   [name], MD  diphenoxylate-atropine (LOMOTIL) 2.5-0.025 MG tablet Take 1 tablet by mouth 4 (four) times daily as needed for diarrhea or loose stools. Patient not taking: Reported on 08/28/2019 08/25/18   [name], MD  ferrous sulfate 325 (65 FE) MG tablet Take 1 tablet (325 mg total) by mouth 3 (three) times daily after meals. Patient not taking: Reported on 08/28/2019 02/17/18   Danae Orleans, PA-C  furosemide (LASIX) 20 MG tablet Take 1 tablet (20 mg total) by mouth daily as needed. Patient not taking: Reported on 08/28/2019 11/05/18   [name], MD  furosemide (LASIX) 20 MG tablet TAKE 1 TABLET BY MOUTH EVERY DAY AS NEEDED Patient not taking: Reported on 08/28/2019 12/01/18   [name], MD  gabapentin (NEURONTIN) 300 MG capsule TAKE 1 CAPSULE BY MOUTH THREE TIMES A DAY Patient taking differently: Take 300 mg by mouth 3 (three) times daily.  07/04/19   [name], MD  megestrol (MEGACE) 40 MG/ML suspension TAKE 10 MLS (400 MG TOTAL) BY MOUTH 2 (TWO) TIMES DAILY. Patient not taking: Reported on 08/28/2019 07/18/19   [name], MD    Current Facility-Administered  Medications  Medication Dose Route Frequency Provider Last Rate Last Dose  . 0.9 %  sodium chloride infusion (Manually program via Guardrails IV Fluids)   Intravenous Once Krista Blue L, PA-C      . 0.9 %  sodium chloride infusion (Manually program via Guardrails IV Fluids)   Intravenous Once Norins, Heinz Knuckles, MD      . 0.9 %  sodium chloride infusion   Intravenous Continuous Norins, Heinz Knuckles, MD      . acetaminophen (TYLENOL) tablet 650 mg  650 mg Oral Q6H PRN Norins, Heinz Knuckles, MD       Or  . acetaminophen (TYLENOL) suppository 650 mg  650 mg Rectal Q6H PRN Norins, Heinz Knuckles, MD      .  ALPRAZolam Duanne Moron) tablet 1 mg  1 mg Oral TID PRN Norins, Heinz Knuckles, MD      . calcium carbonate (TUMS - dosed in mg elemental calcium) chewable tablet 200 mg of elemental calcium  1 tablet Oral TID PRN Norins, Heinz Knuckles, MD      . Chlorhexidine Gluconate Cloth 2 % PADS 6 each  6 each Topical Daily Kyle, Tyrone A, DO      . Chlorhexidine Gluconate Cloth 2 % PADS 6 each  6 each Topical Q0600 Kyle, Tyrone A, DO      . DULoxetine (CYMBALTA) DR capsule 60 mg  60 mg Oral BID Norins, Heinz Knuckles, MD      . furosemide (LASIX) injection 20 mg  20 mg Intravenous Once Norins, Heinz Knuckles, MD      . gabapentin (NEURONTIN) capsule 300 mg  300 mg Oral TID Norins, Heinz Knuckles, MD      . HYDROmorphone (DILAUDID) tablet 8 mg  8 mg Oral Q4H PRN Norins, Heinz Knuckles, MD      . mupirocin ointment (BACTROBAN) 2 % 1 application  1 application Nasal BID Marylyn Ishihara, Tyrone A, DO      . pantoprazole (PROTONIX) 80 mg in sodium chloride 0.9 % 250 mL (0.32 mg/mL) infusion  8 mg/hr Intravenous Continuous Muthersbaugh, Hannah, PA-C 25 mL/hr at 08/28/19 0536 8 mg/hr at 08/28/19 0536  . polyvinyl alcohol (LIQUIFILM TEARS) 1.4 % ophthalmic solution 1 drop  1 drop Both Eyes TID PRN Norins, Heinz Knuckles, MD      . tiZANidine (ZANAFLEX) tablet 4 mg  4 mg Oral Q8H PRN Norins, Heinz Knuckles, MD        Allergies as of 08/27/2019 - Review Complete 08/27/2019   Allergen Reaction Noted  . Ceftriaxone Hives 08/06/2011  . Hydrocodone Swelling 08/06/2011    Family History  Problem Relation Age of Onset  . Hypertension Mother   . Diabetes Brother   . Irritable bowel syndrome Sister   . Cancer Maternal Aunt        ovarian  . Colon cancer Neg Hx     Social History   Socioeconomic History  . Marital status: Married    Spouse name: Not on file  . Number of children: 2  . Years of education: Not on file  . Highest education level: Not on file  Occupational History  . Occupation: Careers adviser  . Financial resource strain: Not on file  . Food insecurity    Worry: Not on file    Inability: Not on file  . Transportation needs    Medical: Not on file    Non-medical: Not on file  Tobacco Use  . Smoking status: Current Every Day Smoker    Packs/day: 1.00    Years: 30.00    Pack years: 30.00    Types: Cigarettes  . Smokeless tobacco: Never Used  Substance and Sexual Activity  . Alcohol use: No  . Drug use: Yes    Types: Marijuana    Comment: Daily. Last used: last night.   . Sexual activity: Not Currently  Lifestyle  . Physical activity    Days per week: Not on file    Minutes per session: Not on file  . Stress: Not on file  Relationships  . Social Herbalist on phone: Not on file    Gets together: Not on file    Attends religious service: Not on file    Active member of club or  organization: Not on file    Attends meetings of clubs or organizations: Not on file    Relationship status: Not on file  . Intimate partner violence    Fear of current or ex partner: Not on file    Emotionally abused: Not on file    Physically abused: Not on file    Forced sexual activity: Not on file  Other Topics Concern  . Not on file  Social History Narrative   6 caffeine drinks daily    08-11-18  Unable to ask abuse questions wife with him today.    Review of Systems: Ten point ROS is O/W negative except as  mentioned in HPI.  Physical Exam: Vital signs in last 24 hours: Temp:  [97.7 F (36.5 C)-98.2 F (36.8 C)] 97.9 F (36.6 C) (09/06 0751) Pulse Rate:  [90-123] 90 (09/06 0623) Resp:  [15-24] 20 (09/06 0623) BP: (106-124)/(78-85) 111/82 (09/06 0623) SpO2:  [97 %-100 %] 100 % (09/06 5670) Weight:  [77.5 kg] 77.5 kg (09/06 0500) Last BM Date: 08/28/19 General:  Alert, Well-developed, well-nourished, pleasant and cooperative in NAD Head:  Normocephalic and atraumatic. Eyes:  Sclera clear, no icterus.  Conjunctiva pink. Ears:  Normal auditory acuity. Mouth:  No deformity or lesions.   Lungs:  Clear throughout to auscultation.  No wheezes, crackles, or rhonchi.  Heart:  Regular rate and rhythm; no murmurs, clicks, rubs, or gallops. Abdomen:  Soft, non-distended.  BS present.  Non-tender. Rectal:  Deferred.  Was heme positive in the ED.  Msk:  Symmetrical without gross deformities. Pulses:  Normal pulses noted. Extremities:  Without clubbing or edema. Neurologic:  Alert and oriented x 4;  grossly normal neurologically. Skin:  Intact without significant lesions or rashes. Psych:  Alert and cooperative. Normal mood and affect.  Intake/Output from previous day: 09/05 0701 - 09/06 0700 In: 193.4 [I.V.:68; Blood:30; IV Piggyback:95.5] Out: -   Lab Results: Recent Labs    08/28/19 0050  WBC 8.6  HGB 6.2*  HCT 20.6*  PLT 134*   BMET Recent Labs    08/28/19 0050  NA 140  K 3.9  CL 106  CO2 24  GLUCOSE 117*  BUN 29*  CREATININE 1.33*  CALCIUM 8.2*   LFT Recent Labs    08/28/19 0050  PROT 5.8*  ALBUMIN 2.8*  AST 14*  ALT 17  ALKPHOS 64  BILITOT 0.4   PT/INR Recent Labs    08/28/19 0159  LABPROT 15.4*  INR 1.2   Studies/Results: Dg Chest 2 View  Result Date: 08/28/2019 CLINICAL DATA:  Chest pain EXAM: CHEST - 2 VIEW COMPARISON:  11/10/2015 FINDINGS: Heart is normal size. Small right pleural effusions suspected. There is hyperinflation of the lungs compatible  with COPD. Nodular airspace opacity in the right upper lobe is similar to prior study and CT. No acute confluent airspace opacity. No acute bony abnormality. IMPRESSION: COPD/chronic changes. Small right pleural effusion. Electronically Signed   By: Rolm Baptise M.D.   On: 08/28/2019 00:50   IMPRESSION:  *GI bleed with melena:  No black stool since Friday.  On PPI gtt. *ABLA:  Due to GI bleed.  Hgb 6.2 grams at midnight.  Getting second unit of PRBC's currently.  Baseline looks 10-11 grams. *Chronic anticoagulation with Xarelto:  Last dose was Thursday. *Renal cell carcinoma with metastatic disease -patient diagnosed in 2009.  Last saw Dr. [name] August 09, 2019 and was doing well/stable at that time.  PLAN: -EGD later today. -Continue PPI gtt for  now.  Laban Emperor. Devri Kreher  08/28/2019, 8:52 AM

## 2019-08-28 NOTE — ED Notes (Signed)
Patient may have mouth swabs only at this time, per ED Provider.

## 2019-08-28 NOTE — Anesthesia Preprocedure Evaluation (Addendum)
Anesthesia Evaluation  Patient identified by MRN, date of birth, ID band Patient awake    Reviewed: Allergy & Precautions, NPO status , Patient's Chart, lab work & pertinent test results  History of Anesthesia Complications Negative for: history of anesthetic complications  Airway Mallampati: II  TM Distance: >3 FB Neck ROM: Full    Dental  (+) Edentulous Lower, Edentulous Upper   Pulmonary Current Smoker and Patient abstained from smoking., PE  Lung cancer s/p partial lobectomy    Pulmonary exam normal        Cardiovascular hypertension, Normal cardiovascular exam     Neuro/Psych PSYCHIATRIC DISORDERS Depression negative neurological ROS     GI/Hepatic negative GI ROS, (+)     substance abuse  marijuana use,   Endo/Other  diabetes, Type 2  Renal/GU Renal InsufficiencyRenal disease Renal cancer s/p nephrectomy      Musculoskeletal negative musculoskeletal ROS (+)   Abdominal   Peds  Hematology  (+) anemia ,  Thrombocytopenia    Anesthesia Other Findings Hx bone, lung, and pancreatic metastases   Reproductive/Obstetrics                            Anesthesia Physical Anesthesia Plan  ASA: III  Anesthesia Plan: MAC   Post-op Pain Management:    Induction: Intravenous  PONV Risk Score and Plan: 1 and Propofol infusion and Treatment may vary due to age or medical condition  Airway Management Planned: Nasal Cannula and Natural Airway  Additional Equipment: None  Intra-op Plan:   Post-operative Plan:   Informed Consent: I have reviewed the patients History and Physical, chart, labs and discussed the procedure including the risks, benefits and alternatives for the proposed anesthesia with the patient or authorized representative who has indicated his/her understanding and acceptance.       Plan Discussed with: CRNA and Anesthesiologist  Anesthesia Plan Comments:         Anesthesia Quick Evaluation

## 2019-08-28 NOTE — Anesthesia Postprocedure Evaluation (Signed)
Anesthesia Post Note  Patient: Keith [name]  Procedure(s) Performed: ESOPHAGOGASTRODUODENOSCOPY (EGD) WITH PROPOFOL (N/A ) BIOPSY     Patient location during evaluation: PACU Anesthesia Type: MAC Level of consciousness: awake and alert Pain management: pain level controlled Vital Signs Assessment: post-procedure vital signs reviewed and stable Respiratory status: spontaneous breathing, nonlabored ventilation and respiratory function stable Cardiovascular status: stable and blood pressure returned to baseline Anesthetic complications: no    Last Vitals:  Vitals:   08/28/19 1500 08/28/19 1600  BP: (!) 141/91   Pulse: 83   Resp: 20   Temp:  36.6 C  SpO2: 98%     Last Pain:  Vitals:   08/28/19 1600  TempSrc: Oral  PainSc:                  Audry Pili

## 2019-08-28 NOTE — H&P (Signed)
History and Physical    Keith [name] WUJ:[id number] DOB: [birth year] DOA: 08/27/2019  PCP: [name], MD (Confirm with patient/family/NH records and if not entered, this has to be entered at [facility] point of entry) Patient coming from: Patient is coming from home  I have personally briefly reviewed patient's old medical records in [place]  Chief Complaint: Black tarry stools with weakness, shortness of breath  HPI: Keith [name] is a 53 y.o. male with medical history significant of renal cell carcinoma diagnosed 2009.  Patient has undergone multiple treatments.  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  GI history significant for bright red per rectum approximately 5 years ago.  He was seen by Dr. Lucio Edward, [facility], and underwent colonoscopy.  He was told he had hemorrhoids.  Patient had no significant GI problems otherwise.  Patient has been on Xarelto for history of pulmonary embolism.  Approximately 5 days ago the patient began to feel weak, developed epigastric abdominal pain and was anorexic.  He reports that starting Friday he had black dark tarry stools that were painless.  At that time he stopped taking Xarelto.  Because of persistent black dark tarry stools shortness of breath, chest pain he came to the [facility] long emergency department.  ED Course: Patient was hemodynamically stable in the emergency department.  Examination confirmed heme positive stool which was melanotic in appearance.  Lab work revealed a hemoglobin of 6.2 g.  Chemistries were normal.  Type and crossmatch was ordered and 2 unit transfusion was ordered.  Patient is referred to [facility] for admission  Review of Systems: As per HPI otherwise 10 point review of systems negative.    Past Medical History:  Diagnosis Date  . Diabetes mellitus without complication ([place])    diet controlled only  . left renal ca d'd 2008  . 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 Metastasis to bone [facility]) 2014/2016    Past Surgical History:  Procedure Laterality Date  . COLONOSCOPY W/ POLYPECTOMY    . FEMUR IM NAIL Right 02/16/2018   Procedure: INTRAMEDULLARY (IM) NAIL RIGHT FEMORAL;  Surgeon: Paralee Cancel, MD;  Location: [facility] ORS;  Service: Orthopedics;  Laterality: Right;  . HAND SURGERY Right   . IR ANGIOGRAM EXTREMITY RIGHT  02/15/2018  . IR ANGIOGRAM SELECTIVE EACH ADDITIONAL VESSEL  02/15/2018  . IR ANGIOGRAM SELECTIVE EACH ADDITIONAL VESSEL  02/15/2018  . IR ANGIOGRAM SELECTIVE EACH ADDITIONAL VESSEL  02/15/2018  . IR ANGIOGRAM SELECTIVE EACH ADDITIONAL VESSEL  02/15/2018  . IR EMBO TUMOR ORGAN ISCHEMIA INFARCT INC GUIDE ROADMAPPING  02/15/2018  . IR EMBO TUMOR ORGAN ISCHEMIA INFARCT INC GUIDE ROADMAPPING  02/15/2018  . IR US GUIDE VASC ACCESS LEFT  02/15/2018  . LUNG REMOVAL, PARTIAL Right 09/2008  . NEPHRECTOMY RADICAL     Left   . PAIN PUMP IMPLANTATION N/A 05/16/2015   Procedure: Intrathecal pain pump placement;  Surgeon: Clydell Hakim, MD;  Location: [place] NEURO ORS;  Service: Neurosurgery;  Laterality: N/A;  Intrathecal pain pump placement   Social history -patient is married over 20 years.  He has 2 daughters age 14 and 22 and 3 grandchildren.  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  He lives with his wife.   reports that he has been smoking cigarettes. He has a 30.00 pack-year smoking history. He has never used smokeless tobacco. He reports current drug use.  Drug: Marijuana. He reports that he does not drink alcohol.  Allergies  Allergen Reactions  . Ceftriaxone Hives  . Hydrocodone Swelling    Family History  Problem Relation Age of Onset  . Hypertension Mother   . Diabetes Brother   . Irritable bowel syndrome Sister   . Cancer Maternal Aunt        ovarian  . Colon cancer Neg Hx      Prior to Admission medications   Medication Sig Start Date End Date Taking? Authorizing Provider   ALPRAZolam Duanne Moron) 1 MG tablet Take 1 tablet (1 mg total) by mouth 3 (three) times daily as needed for anxiety. 08/11/19  Yes [name], MD  calcium carbonate (TUMS - DOSED IN MG ELEMENTAL CALCIUM) 500 MG chewable tablet Chew 1 tablet by mouth 3 (three) times daily as needed for indigestion or heartburn.    Yes [provider]  DULoxetine (CYMBALTA) 60 MG capsule Take 1 capsule (60 mg total) by mouth 2 (two) times daily. 04/06/19  Yes [name], MD  HYDROmorphone (DILAUDID) 8 MG tablet Take 1 tablet (8 mg total) by mouth every 4 (four) hours as needed for severe pain. 02/17/18  Yes Babish, Rodman Key, PA-C  hydroxypropyl methylcellulose / hypromellose (ISOPTO TEARS / GONIOVISC) 2.5 % ophthalmic solution Place 1 drop into both eyes 3 (three) times daily as needed for dry eyes.   Yes [provider]  ibuprofen (ADVIL) 200 MG tablet Take 400 mg by mouth every 6 (six) hours as needed for moderate pain.   Yes [provider]  loratadine (CLARITIN) 10 MG tablet Take 10 mg by mouth daily.   Yes [provider]  rivaroxaban (XARELTO) 20 MG TABS tablet Take 1 tablet (20 mg total) by mouth daily with supper. 04/06/19  Yes [name], MD  tiZANidine (ZANAFLEX) 4 MG tablet Take 1 tablet (4 mg total) by mouth every 8 (eight) hours as needed for muscle spasms. 02/17/18  Yes Babish, Rodman Key, PA-C  cabozantinib (CABOMETYX) 60 MG tablet Take 1 tablet (60 mg total) by mouth daily. With at least 8 ounces of water on an empty stomach. Do not eat for 2 hours before or 1 hour after. Swallow whole. 08/16/19   [name], MD  diphenoxylate-atropine (LOMOTIL) 2.5-0.025 MG tablet Take 1 tablet by mouth 4 (four) times daily as needed for diarrhea or loose stools. Patient not taking: Reported on 08/28/2019 08/25/18   [name], MD  ferrous sulfate 325 (65 FE) MG tablet Take 1 tablet (325 mg total) by mouth 3 (three) times daily after meals. Patient not taking: Reported on  08/28/2019 02/17/18   Danae Orleans, PA-C  furosemide (LASIX) 20 MG tablet Take 1 tablet (20 mg total) by mouth daily as needed. Patient not taking: Reported on 08/28/2019 11/05/18   [name], MD  furosemide (LASIX) 20 MG tablet TAKE 1 TABLET BY MOUTH EVERY DAY AS NEEDED Patient not taking: Reported on 08/28/2019 12/01/18   [name], MD  gabapentin (NEURONTIN) 300 MG capsule TAKE 1 CAPSULE BY MOUTH THREE TIMES A DAY Patient taking differently: Take 300 mg by mouth 3 (three) times daily.  07/04/19   [name], MD  megestrol (MEGACE) 40 MG/ML suspension TAKE 10 MLS (400 MG TOTAL) BY MOUTH 2 (TWO) TIMES DAILY. Patient not taking: Reported on 08/28/2019 07/18/19   [name], MD    Physical Exam: Vitals:   08/27/19 2317 08/28/19 0252  BP: 122/85 124/84  Pulse: [name])  123 (!) 102  Resp: 16 19  Temp: 98.2 F (36.8 C)   TempSrc: Oral   SpO2: 100% 100%    Constitutional: NAD, calm, comfortable Vitals:   08/27/19 2317 08/28/19 0252  BP: 122/85 124/84  Pulse: (!) 123 (!) 102  Resp: 16 19  Temp: 98.2 F (36.8 C)   TempSrc: Oral   SpO2: 100% 100%   General appearance: Slender man looking older than his stated chronologic age in no acute distress.  He is a good historian.   Eyes: PERRL, lids and conjunctivae normal ENMT: Mucous membranes are moist. Posterior pharynx clear of any exudate or lesions.patient is edentulous Neck: normal, supple, no masses, no thyromegaly Respiratory: clear to auscultation bilaterally, no wheezing, no crackles. Normal respiratory effort. No accessory muscle use.  Cardiovascular: Regular cardia, no murmurs / rubs / gallops. No extremity edema. 2+ pedal pulses. No carotid bruits.  Abdomen: Tender to palpation in the upper abdomen, no masses palpated. No hepatosplenomegaly. Bowel sounds positive.  Musculoskeletal: no clubbing / cyanosis. No joint deformity upper and lower extremities.  Decreased range of motion lower extremity secondary to prior  orthopedic procedures secondary to bone mets, no contractures. Normal muscle tone.  Skin: no rashes, lesions, ulcers. No induration Neurologic: CN 2-12 grossly intact. Sensation intact,  Strength 5/5 in all 4.  Psychiatric: Normal judgment and insight. Alert and oriented x 3. Normal mood.     Labs on Admission: I have personally reviewed following labs and imaging studies  CBC: Recent Labs  Lab 08/28/19 0050  WBC 8.6  HGB 6.2*  HCT 20.6*  MCV 108.4*  PLT 676*   Basic Metabolic Panel: Recent Labs  Lab 08/28/19 0050  NA 140  K 3.9  CL 106  CO2 24  GLUCOSE 117*  BUN 29*  CREATININE 1.33*  CALCIUM 8.2*   GFR: CrCl cannot be calculated (Unknown ideal weight.). Liver Function Tests: Recent Labs  Lab 08/28/19 0050  AST 14*  ALT 17  ALKPHOS 64  BILITOT 0.4  PROT 5.8*  ALBUMIN 2.8*   Recent Labs  Lab 08/28/19 0019  LIPASE 17   No results for input(s): AMMONIA in the last 168 hours. Coagulation Profile: Recent Labs  Lab 08/28/19 0159  INR 1.2   Cardiac Enzymes: No results for input(s): CKTOTAL, CKMB, CKMBINDEX, TROPONINI in the last 168 hours. BNP (last 3 results) No results for input(s): PROBNP in the last 8760 hours. HbA1C: No results for input(s): HGBA1C in the last 72 hours. CBG: No results for input(s): GLUCAP in the last 168 hours. Lipid Profile: No results for input(s): CHOL, HDL, LDLCALC, TRIG, CHOLHDL, LDLDIRECT in the last 72 hours. Thyroid Function Tests: No results for input(s): TSH, T4TOTAL, FREET4, T3FREE, THYROIDAB in the last 72 hours. Anemia Panel: No results for input(s): VITAMINB12, FOLATE, FERRITIN, TIBC, IRON, RETICCTPCT in the last 72 hours. Urine analysis:    Component Value Date/Time   COLORURINE YELLOW 11/12/2015 2228   APPEARANCEUR CLOUDY (A) 11/12/2015 2228   LABSPEC 1.014 11/12/2015 2228   PHURINE 6.0 11/12/2015 2228   GLUCOSEU NEGATIVE 11/12/2015 2228   HGBUR NEGATIVE 11/12/2015 2228   BILIRUBINUR NEGATIVE 11/12/2015  2228   KETONESUR NEGATIVE 11/12/2015 2228   PROTEINUR NEGATIVE 11/12/2015 2228   UROBILINOGEN 0.2 09/20/2015 1547   NITRITE NEGATIVE 11/12/2015 2228   LEUKOCYTESUR NEGATIVE 11/12/2015 2228    Radiological Exams on Admission: Dg Chest 2 View  Result Date: 08/28/2019 CLINICAL DATA:  Chest pain EXAM: CHEST - 2 VIEW COMPARISON:  11/10/2015 FINDINGS: Heart  is normal size. Small right pleural effusions suspected. There is hyperinflation of the lungs compatible with COPD. Nodular airspace opacity in the right upper lobe is similar to prior study and CT. No acute confluent airspace opacity. No acute bony abnormality. IMPRESSION: COPD/chronic changes. Small right pleural effusion. Electronically Signed   By: Rolm Baptise M.D.   On: 08/28/2019 00:50    EKG: Independently reviewed.  Sinus tachycardia with no acute changes  Assessment/Plan Active Problems:   UGI bleed   Diabetes mellitus with diabetic neuropathy, with long-term current use of insulin (HCC)   Benign essential HTN   Pulmonary embolus, left (HCC)   Metastasis from malignant tumor of kidney (HCC)   Acute upper GI bleed  (please populate well all problems here in Problem List. (For example, if patient is on BP meds at home and you resume or decide to hold them, it is a problem that needs to be her. Same for CAD, COPD, HLD and so on)   1.  Upper GI bleed -patient with melanotic stools and significant anemia with associated symptoms of shortness of breath tachycardia and mild chest pain.  Cardiac symptoms most likely secondary to demand ischemia.  Patient stopped taking Xarelto on Friday, July 26, 2019. Plan admit to stepdown  PPI continuous infusion  Transfused 2 units of packed RBCs with follow-up H&H.  Goal to keep hemoglobin of 8 or greater  GI consult later today.  He may require endoscopy.  2.  Renal cell carcinoma with metastatic disease -patient diagnosed in 2009.  Last saw Dr. [name] August 09, 2019 and was doing well at that  time.   Plan do his present medication regimen  3.  Hypertension-continue home medications  4.  History of pulmonary embolus -Xarelto was discontinued.  Bleeding risk outweighs benefit. Plan SCDs  5.  Diabetes-glucose is reasonably controlled.  Patient is on diabetic diet only.  He is currently n.p.o. Plan diet is resumed he will need to have CBGs  DVT prophylaxis: SCDs (Lovenox/Heparin/SCD's/anticoagulated/None (if comfort care) Code Status: Full code (Full/Partial (specify details) Family Communication: spoke with spouse (Specify name, relationship. Do not write "discussed with patient". Specify tel # if discussed over the phone) Disposition Plan: home 3-5 days (specify when and where you expect patient to be discharged) Consults called: GI - Dr. Modena Nunnery for [facility] (with names) Admission status: SDU (inpatient / obs / tele / medical floor / SDU)   Adella Hare MD [facility] Pager [phone]  If 7PM-7AM, please contact night-coverage [email or web address] Password [email or web address]  08/28/2019, 4:05 AM

## 2019-08-28 NOTE — Progress Notes (Signed)
Pharmacy Brief note: Cabozantinib  Per [facility] policy, cabozantinib is held when any of the following occurs:  Cabozantinib (Cometriq) Hold Criteria: . Hgb < 8 . WBC < 1000 . ANC < 500 . Pltc < 25K . Proteinuria > 3.5 g / 24hr . Acute arterial or venous thromboembolic event . GI perforation or fistula . Hand / foot syndrome - Grade 2 or higher* . Hypertensive crisis . Osteonecrosis of jaw . Severe hemorrhage . Surgery or invasive dental procedure planned within 28d . Unexplained neuro signs/symptoms: seizure, visual disturbance, lethargy, confusion, severe headache . Unhealed existing wound from previous surgery . Wound dehiscence or complications requiring medical intervention . Active infection  9/6:  hgb = 6.2 , will hold per policy.    Thanks Dorrene German 08/28/2019 5:34 AM

## 2019-08-28 NOTE — Transfer of Care (Signed)
Immediate Anesthesia Transfer of Care Note  Patient: Keith [name]  Procedure(s) Performed: Procedure(s): ESOPHAGOGASTRODUODENOSCOPY (EGD) WITH PROPOFOL (N/A) BIOPSY  Patient Location: PACU  Anesthesia Type:MAC  Level of Consciousness:  sedated, patient cooperative and responds to stimulation  Airway & Oxygen Therapy:Patient Spontanous Breathing and Patient connected to face mask oxgen  Post-op Assessment:  Report given to PACU RN and Post -op Vital signs reviewed and stable  Post vital signs:  Reviewed and stable  Last Vitals:  Vitals:   08/28/19 1100 08/28/19 1112  BP: 108/80 120/79  Pulse:  80  Resp: 16 10  Temp:  37.1 C  SpO2:  419%    Complications: No apparent anesthesia complications

## 2019-08-29 LAB — TYPE AND SCREEN
ABO/RH(D): A NEG
Antibody Screen: NEGATIVE
Unit division: 0
Unit division: 0

## 2019-08-29 LAB — HEMOGLOBIN AND HEMATOCRIT, BLOOD
HCT: 23.9 % — ABNORMAL LOW (ref 39.0–52.0)
Hemoglobin: 7.2 g/dL — ABNORMAL LOW (ref 13.0–17.0)

## 2019-08-29 LAB — BPAM RBC
Blood Product Expiration Date: 202009172359
Blood Product Expiration Date: 202009272359
ISSUE DATE / TIME: 202009060605
ISSUE DATE / TIME: 202009060930
Unit Type and Rh: 600
Unit Type and Rh: 600

## 2019-08-29 LAB — CBC
HCT: 25.4 % — ABNORMAL LOW (ref 39.0–52.0)
Hemoglobin: 7.7 g/dL — ABNORMAL LOW (ref 13.0–17.0)
MCH: 31.2 pg (ref 26.0–34.0)
MCHC: 30.3 g/dL (ref 30.0–36.0)
MCV: 102.8 fL — ABNORMAL HIGH (ref 80.0–100.0)
Platelets: 94 10*3/uL — ABNORMAL LOW (ref 150–400)
RBC: 2.47 MIL/uL — ABNORMAL LOW (ref 4.22–5.81)
RDW: 21 % — ABNORMAL HIGH (ref 11.5–15.5)
WBC: 5.7 10*3/uL (ref 4.0–10.5)
nRBC: 0 % (ref 0.0–0.2)

## 2019-08-29 LAB — BASIC METABOLIC PANEL
Anion gap: 7 (ref 5–15)
BUN: 24 mg/dL — ABNORMAL HIGH (ref 6–20)
CO2: 23 mmol/L (ref 22–32)
Calcium: 7.3 mg/dL — ABNORMAL LOW (ref 8.9–10.3)
Chloride: 110 mmol/L (ref 98–111)
Creatinine, Ser: 1.14 mg/dL (ref 0.61–1.24)
GFR calc Af Amer: >60 mL/min (ref >60)
GFR calc non Af Amer: >60 mL/min (ref >60)
Glucose, Bld: 94 mg/dL (ref 70–99)
Potassium: 4.1 mmol/L (ref 3.5–5.1)
Sodium: 140 mmol/L (ref 135–145)

## 2019-08-29 NOTE — Progress Notes (Signed)
     [facility] Progress Note  CC:  GI bleed  Subjective:  Hgb 7.7 grams this AM.  Feels much better.  No sign of bleeding.  Having brown liquid stools.  EGD on 9/6 showed the following:  - LA Grade C esophagitis. Biopsied. The likely source of bleeding in the setting of Xarelto. - Non-bleeding erosive gastropathy. Biopsied. - Hiatal hernia. - Normal examined duodenum. - No evidence for recent or active bleeding.  Objective:  Vital signs in last 24 hours: Temp:  [97.4 F (36.3 C)-98.7 F (37.1 C)] 97.4 F (36.3 C) (09/07 0807) Pulse Rate:  [68-89] 71 (09/07 0600) Resp:  [9-25] 12 (09/07 0600) BP: (88-141)/(60-96) 129/90 (09/07 0600) SpO2:  [95 %-100 %] 95 % (09/07 0600) Weight:  [78.9 kg] 78.9 kg (09/06 1112) Last BM Date: 08/28/19 General:  Alert, Well-developed, in NAD Heart:  Regular rate and rhythm; no murmurs Pulm:  CTAB.  No increased WOB. Abdomen:  Soft, non-distended.  BS present.  Non-tender. Extremities:  Without edema. Neurologic:  Alert and oriented x 4;  grossly normal neurologically. Psych:  Alert and cooperative. Normal mood and affect.  Intake/Output from previous day: 09/06 0701 - 09/07 0700 In: 3239 [P.O.:660; I.V.:1821.5; Blood:757.5] Out: 1850 [Urine:1850]  Lab Results: Recent Labs    08/28/19 0050 08/28/19 1515 08/29/19 0236  WBC 8.6  --  5.7  HGB 6.2* 8.9* 7.7*  HCT 20.6* 27.1* 25.4*  PLT 134*  --  94*   BMET Recent Labs    08/28/19 0050 08/29/19 0236  NA 140 140  K 3.9 4.1  CL 106 110  CO2 24 23  GLUCOSE 117* 94  BUN 29* 24*  CREATININE 1.33* 1.14  CALCIUM 8.2* 7.3*   LFT Recent Labs    08/28/19 0050  PROT 5.8*  ALBUMIN 2.8*  AST 14*  ALT 17  ALKPHOS 64  BILITOT 0.4   PT/INR Recent Labs    08/28/19 0159  LABPROT 15.4*  INR 1.2   Dg Chest 2 View  Result Date: 08/28/2019 CLINICAL DATA:  Chest pain EXAM: CHEST - 2 VIEW COMPARISON:  11/10/2015 FINDINGS: Heart is normal size. Small right pleural  effusions suspected. There is hyperinflation of the lungs compatible with COPD. Nodular airspace opacity in the right upper lobe is similar to prior study and CT. No acute confluent airspace opacity. No acute bony abnormality. IMPRESSION: COPD/chronic changes. Small right pleural effusion. Electronically Signed   By: Rolm Baptise M.D.   On: 08/28/2019 00:50   Assessment / Plan: *GI bleed with melena:  Secondary to Grade C esophagitis seen on EGD 9/6 in the setting of Xarelto.  No with brown stools. *ABLA:  Due to GI bleed.  Hgb 7.7 grams this AM, down from 8.9 grams yesterday.  Could be some dilutional drop or equilibration.  No sign of bleeding. *Chronic anticoagulation with Xarelto:  Last dose was Thursday. *Renal cell carcinoma with metastatic disease -patient diagnosed in 2009. Last saw Dr. [name] August 09, 2019 and was doing well/stable at that time.  -PPI BID for 8 weeks. -Carafate suspension four times daily for 2 weeks. -Monitor Hgb and transfuse further prn. -Await path results. -? When ok to resume Xarelto.   LOS: 1 day   Laban Emperor. Akaila Rambo  08/29/2019, 9:03 AM

## 2019-08-29 NOTE — Progress Notes (Signed)
Keith Kitchen  PROGRESS NOTE    Keith [name]  Keith [name] DOB: Keith [name] DOA: 08/27/2019 PCP: [name], MD   Brief Narrative:   Keith [name] is a 53 y.o. male with medical history significant of renal cell carcinoma diagnosed 2009.  Patient has undergone multiple treatments.  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  GI history significant for bright red per rectum approximately 5 years ago.  He was seen by Dr. Lucio Edward, [facility], and underwent colonoscopy.  He was told he had hemorrhoids.  Patient had no significant GI problems otherwise.  Patient has been on Xarelto for history of pulmonary embolism.  Approximately 5 days ago the patient began to feel weak, developed epigastric abdominal pain and was anorexic.  He reports that starting Friday he had black dark tarry stools that were painless.  At that time he stopped taking Xarelto.  Because of persistent black dark tarry stools shortness of breath, chest pain he came to the [facility] long emergency department.  9/7: No s/p EGD. Hgb drop from high of 8.9 after transfusion yesterday to 7.7 this AM. Bleeding esophagitis found on EGD. GI still onboard. BID PPI at this time.   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     - Hx of pulmonary embolus on xarelto; this has been stopped d/t bleed; will need to know when we can resume     - q12h H&H for now and transfuse as necessary; await final GI recs     - I think he's ok to TTF (tele)  Renal cell  carcinoma with metastatic disease     - continue follow up with onco (last seen 07/2019)  Hypertension     - BP acceptable off meds; monitor  History of pulmonary embolus     - see above  Diabetes     - check A1c   DVT prophylaxis: SCDs Code Status: FULL   Disposition Plan: TBD  Consultants:   GI  Procedures:   EGD   ROS:  C/o anxiety. Denies CP, dyspnea, N, V, frank bleed. Remainder 10-pt ROS is negative for all not previously mentioned.  Subjective: "You know. I just a little nervous."  Objective: Vitals:   08/29/19 0400 08/29/19 0402 08/29/19 0600 08/29/19 0807  BP: 115/85  129/90   Pulse: 70 71 71   Resp: (!) 9 14 12    Temp: 97.6 F (36.4 C)   (!) 97.4 F (36.3 C)  TempSrc: Oral   Oral  SpO2: 98% 96% 95%   Weight:      Height:        Intake/Output Summary (Last 24 hours) at 08/29/2019 0835 Last data filed at 08/29/2019 0500 Gross per 24 hour  Intake 3238.98 ml  Output 1850 ml  Net 1388.98 ml   Filed Weights   08/28/19 0500 08/28/19 0900 08/28/19 1112  Weight: 77.5  kg 77.5 kg 78.9 kg    Examination:  General: 53 y.o. male resting in bed in NAD Eyes: PERRL, normal sclera ENMT: Nares patent w/o discharge, orophaynx clear, dentition normal, ears w/o discharge/lesions/ulcers Cardiovascular: RRR, +S1, S2, no m/g/r, equal pulses throughout Respiratory: CTABL, no w/r/r, normal WOB GI: BS+, NDNT, no masses noted, no organomegaly noted MSK: No e/c/c Neuro: A&O x 3, no focal deficits   Data Reviewed: I have personally reviewed following labs and imaging studies.  CBC: Recent Labs  Lab 08/28/19 0050 08/28/19 1515 08/29/19 0236  WBC 8.6  --  5.7  HGB 6.2* 8.9* 7.7*  HCT 20.6* 27.1* 25.4*  MCV 108.4*  --  102.8*  PLT 134*  --  94*   Basic Metabolic Panel: Recent Labs  Lab 08/28/19 0050 08/29/19 0236  NA 140 140  K 3.9 4.1  CL 106 110  CO2 24 23  GLUCOSE 117* 94  BUN 29* 24*  CREATININE 1.33* 1.14  CALCIUM 8.2* 7.3*   GFR: Estimated  Creatinine Clearance: 84.6 mL/min (by C-G formula based on SCr of 1.14 mg/dL). Liver Function Tests: Recent Labs  Lab 08/28/19 0050  AST 14*  ALT 17  ALKPHOS 64  BILITOT 0.4  PROT 5.8*  ALBUMIN 2.8*   Recent Labs  Lab 08/28/19 0019  LIPASE 17   No results for input(s): AMMONIA in the last 168 hours. Coagulation Profile: Recent Labs  Lab 08/28/19 0159  INR 1.2   Cardiac Enzymes: No results for input(s): CKTOTAL, CKMB, CKMBINDEX, TROPONINI in the last 168 hours. BNP (last 3 results) No results for input(s): PROBNP in the last 8760 hours. HbA1C: No results for input(s): HGBA1C in the last 72 hours. CBG: No results for input(s): GLUCAP in the last 168 hours. Lipid Profile: No results for input(s): CHOL, HDL, LDLCALC, TRIG, CHOLHDL, LDLDIRECT in the last 72 hours. Thyroid Function Tests: No results for input(s): TSH, T4TOTAL, FREET4, T3FREE, THYROIDAB in the last 72 hours. Anemia Panel: No results for input(s): VITAMINB12, FOLATE, FERRITIN, TIBC, IRON, RETICCTPCT in the last 72 hours. Sepsis Labs: No results for input(s): PROCALCITON, LATICACIDVEN in the last 168 hours.  Recent Results (from the past 240 hour(s))  SARS Coronavirus 2 [facility] order, Performed in [facility] hospital lab) Nasopharyngeal Nasopharyngeal Swab     Status: None   Collection Time: 08/28/19  3:05 AM   Specimen: Nasopharyngeal Swab  Result Value Ref Range Status   SARS Coronavirus 2 NEGATIVE NEGATIVE Final    Comment: (NOTE) If result is NEGATIVE SARS-CoV-2 target nucleic acids are NOT DETECTED. The SARS-CoV-2 RNA is generally detectable in upper and lower  respiratory specimens during the acute phase of infection. The lowest  concentration of SARS-CoV-2 viral copies this assay can detect is 250  copies / mL. A negative result does not preclude SARS-CoV-2 infection  and should not be used as the sole basis for treatment or other  patient management decisions.  A negative result may occur with   improper specimen collection / handling, submission of specimen other  than nasopharyngeal swab, presence of viral mutation(s) within the  areas targeted by this assay, and inadequate number of viral copies  (<250 copies / mL). A negative result must be combined with clinical  observations, patient history, and epidemiological information. If result is POSITIVE SARS-CoV-2 target nucleic acids are DETECTED. The SARS-CoV-2 RNA is generally detectable in upper and lower  respiratory specimens dur ing the acute phase of infection.  Positive  results are indicative of active infection  with SARS-CoV-2.  Clinical  correlation with patient history and other diagnostic information is  necessary to determine patient infection status.  Positive results do  not rule out bacterial infection or co-infection with other viruses. If result is PRESUMPTIVE POSTIVE SARS-CoV-2 nucleic acids MAY BE PRESENT.   A presumptive positive result was obtained on the submitted specimen  and confirmed on repeat testing.  While 2019 novel coronavirus  (SARS-CoV-2) nucleic acids may be present in the submitted sample  additional confirmatory testing may be necessary for epidemiological  and / or clinical management purposes  to differentiate between  SARS-CoV-2 and other Sarbecovirus currently known to infect humans.  If clinically indicated additional testing with an alternate test  methodology [phone]) is advised. The SARS-CoV-2 RNA is generally  detectable in upper and lower respiratory sp ecimens during the acute  phase of infection. The expected result is Negative. Fact Sheet for Patients:  StrictlyIdeas.no Fact Sheet for Healthcare Providers: BankingDealers.co.za This test is not yet approved or cleared by the [place] FDA and has been authorized for detection and/or diagnosis of SARS-CoV-2 by FDA under an Emergency Use Authorization (EUA).  This EUA will  remain in effect (meaning this test can be used) for the duration of the COVID-19 declaration under Section 564(b)(1) of the Act, 21 U.S.C. section 360bbb-3(b)(1), unless the authorization is terminated or revoked sooner. Performed at [facility], [place] [address] [place], [place] [address]   MRSA PCR Screening     Status: Abnormal   Collection Time: 08/28/19  5:21 AM   Specimen: Nasal Mucosa; Nasopharyngeal  Result Value Ref Range Status   MRSA by PCR POSITIVE (A) NEGATIVE Final    Comment:        The GeneXpert MRSA Assay (FDA approved for NASAL specimens only), is one component of a comprehensive MRSA colonization surveillance program. It is not intended to diagnose MRSA infection nor to guide or monitor treatment for MRSA infections. CRITICAL RESULT CALLED TO, READ BACK BY AND VERIFIED WITH: RN Tyson Dense AT 0630 08/28/19 CRUICKSHANK A Performed at [facility], [place] [address], [place], [place] [address]       Radiology Studies: Dg Chest 2 View  Result Date: 08/28/2019 CLINICAL DATA:  Chest pain EXAM: CHEST - 2 VIEW COMPARISON:  11/10/2015 FINDINGS: Heart is normal size. Small right pleural effusions suspected. There is hyperinflation of the lungs compatible with COPD. Nodular airspace opacity in the right upper lobe is similar to prior study and CT. No acute confluent airspace opacity. No acute bony abnormality. IMPRESSION: COPD/chronic changes. Small right pleural effusion. Electronically Signed   By: Rolm Baptise M.D.   On: 08/28/2019 00:50     Scheduled Meds: . sodium chloride   Intravenous Once  . sodium chloride   Intravenous Once  . Chlorhexidine Gluconate Cloth  6 each Topical Daily  . Chlorhexidine Gluconate Cloth  6 each Topical Q0600  . DULoxetine  60 mg Oral BID  . gabapentin  300 mg Oral TID  . mupirocin ointment  1 application Nasal BID   Continuous Infusions: . pantoprozole (PROTONIX) infusion 8 mg/hr (08/29/19 0053)      LOS: 1 day    Time spent: 25 minutes spent in the coordination of care today.    Jonnie Finner, DO [facility] Pager [phone]  If 7PM-7AM, please contact night-coverage [email or web address] Password [facility] 08/29/2019, 8:35 AM

## 2019-08-29 NOTE — Progress Notes (Signed)
Pt transferred to 1520. Full report given to [employer]. Pt transferred via bed with telemetry.

## 2019-08-30 ENCOUNTER — Encounter (HOSPITAL_COMMUNITY): Payer: Self-pay | Admitting: Gastroenterology

## 2019-08-30 DIAGNOSIS — K209 Esophagitis, unspecified: Secondary | ICD-10-CM

## 2019-08-30 DIAGNOSIS — K922 Gastrointestinal hemorrhage, unspecified: Secondary | ICD-10-CM

## 2019-08-30 LAB — CBC WITH DIFFERENTIAL/PLATELET
Abs Immature Granulocytes: 0.01 10*3/uL (ref 0.00–0.07)
Basophils Absolute: 0 10*3/uL (ref 0.0–0.1)
Basophils Relative: 0 %
Eosinophils Absolute: 0.1 10*3/uL (ref 0.0–0.5)
Eosinophils Relative: 2 %
HCT: 23.8 % — ABNORMAL LOW (ref 39.0–52.0)
Hemoglobin: 7.2 g/dL — ABNORMAL LOW (ref 13.0–17.0)
Immature Granulocytes: 0 %
Lymphocytes Relative: 25 %
Lymphs Abs: 1.1 10*3/uL (ref 0.7–4.0)
MCH: 31.3 pg (ref 26.0–34.0)
MCHC: 30.3 g/dL (ref 30.0–36.0)
MCV: 103.5 fL — ABNORMAL HIGH (ref 80.0–100.0)
Monocytes Absolute: 0.3 10*3/uL (ref 0.1–1.0)
Monocytes Relative: 7 %
Neutro Abs: 2.7 10*3/uL (ref 1.7–7.7)
Neutrophils Relative %: 66 %
Platelets: 91 10*3/uL — ABNORMAL LOW (ref 150–400)
RBC: 2.3 MIL/uL — ABNORMAL LOW (ref 4.22–5.81)
RDW: 20 % — ABNORMAL HIGH (ref 11.5–15.5)
WBC: 4.2 10*3/uL (ref 4.0–10.5)
nRBC: 0 % (ref 0.0–0.2)

## 2019-08-30 LAB — RENAL FUNCTION PANEL
Albumin: 2.4 g/dL — ABNORMAL LOW (ref 3.5–5.0)
Anion gap: 5 (ref 5–15)
BUN: 15 mg/dL (ref 6–20)
CO2: 23 mmol/L (ref 22–32)
Calcium: 7.4 mg/dL — ABNORMAL LOW (ref 8.9–10.3)
Chloride: 109 mmol/L (ref 98–111)
Creatinine, Ser: 1.07 mg/dL (ref 0.61–1.24)
GFR calc Af Amer: >60 mL/min (ref >60)
GFR calc non Af Amer: >60 mL/min (ref >60)
Glucose, Bld: 98 mg/dL (ref 70–99)
Phosphorus: 2.6 mg/dL (ref 2.5–4.6)
Potassium: 3.8 mmol/L (ref 3.5–5.1)
Sodium: 137 mmol/L (ref 135–145)

## 2019-08-30 LAB — HEMOGLOBIN AND HEMATOCRIT, BLOOD
HCT: 24.6 % — ABNORMAL LOW (ref 39.0–52.0)
HCT: 24 % — ABNORMAL LOW (ref 39.0–52.0)
Hemoglobin: 7.5 g/dL — ABNORMAL LOW (ref 13.0–17.0)
Hemoglobin: 7.2 g/dL — ABNORMAL LOW (ref 13.0–17.0)

## 2019-08-30 LAB — HEMOGLOBIN A1C
Hgb A1c MFr Bld: <4.2 % — ABNORMAL LOW (ref 4.8–5.6)
Mean Plasma Glucose: <74 mg/dL

## 2019-08-30 LAB — MAGNESIUM: Magnesium: 1.4 mg/dL — ABNORMAL LOW (ref 1.7–2.4)

## 2019-08-30 LAB — HIV ANTIBODY (ROUTINE TESTING W REFLEX): HIV Screen 4th Generation wRfx: NONREACTIVE

## 2019-08-30 MED ORDER — SUCRALFATE 1 GM/10ML PO SUSP
1.0000 g | Freq: Four times a day (QID) | ORAL | Status: DC
  Administered 2019-08-30 – 2019-08-31 (×4): 1 g via ORAL
  Filled 2019-08-30 (×4): qty 10

## 2019-08-30 MED ORDER — MAGNESIUM OXIDE 400 (241.3 MG) MG PO TABS
400.0000 mg | ORAL_TABLET | Freq: Two times a day (BID) | ORAL | Status: DC
  Administered 2019-08-30 – 2019-08-31 (×3): 400 mg via ORAL
  Filled 2019-08-30 (×3): qty 1

## 2019-08-30 MED ORDER — RIVAROXABAN 20 MG PO TABS
20.0000 mg | ORAL_TABLET | Freq: Every day | ORAL | Status: DC
  Administered 2019-08-30: 20 mg via ORAL
  Filled 2019-08-30: qty 1

## 2019-08-30 NOTE — Progress Notes (Signed)
[name]  PROGRESS NOTE    Keith [name]  YCX:[id number] DOB: [birth year] DOA: 08/27/2019 PCP: Keith Pretty, MD   Brief Narrative:   Keith [name] a 53 y.o.malewith medical history significant ofrenal cell carcinoma diagnosed 2009. Patient has undergone multiple treatments. 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 GI history significant for bright red per rectum approximately 5 years ago. He was seen by Keith [name], [facility], and underwent colonoscopy. He was told he had hemorrhoids. Patient had no significant GI problems otherwise. Patient has been on Xarelto for history of pulmonary embolism. Approximately 5 days ago the patient began to feel weak, developed epigastric abdominal pain and was anorexic. He reports that starting Friday he had black dark tarry stools that were painless. At that time he stopped taking Xarelto. Because of persistent black dark tarry stools shortness of breath,chest pain he came to the [name] department.  9/7: No s/p EGD. Hgb drop from high of 8.9 after transfusion yesterday to 7.7 this AM. Bleeding esophagitis found on EGD. GI still onboard. BID PPI at this time. 9/8: Hgb down 1/2 point today. He doesn't report any further bleeding. GI says ok to resume xarelto. Transfuse as necessary. 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     - Hx of pulmonary embolus on xarelto; this has been stopped d/t bleed; will need to know when  we can resume     - q12h H&H for now and transfuse as necessary; await final GI recs     - Appreciate GI recs, see note     - Hgb down to 7.2 today; Hgb down 1/2 point today. He doesn't report any further bleeding. GI says ok to resume xarelto. Transfuse as necessary  Renal cell carcinoma with metastatic disease     - continue follow up with onco (last seen 07/2019)  Hypertension     - BP acceptable off meds; monitor  History of pulmonary embolus     - see above  Diabetes     - A1c pending  Hypomagnesemia     - add MagOx 400mg  BID  DVT prophylaxis: SCDs/Xarelto Code Status: FULL   Disposition Plan: TBD  Consultants:   GI  Procedures:   EGD   ROS:  Denies CP, dyspnea, ab pain, N, V. Reports pain at IV site. Remainder 10-pt ROS is negative for all not previously mentioned.  Subjective: "This thing hurts, doc."  Objective: Vitals:   08/29/19 0807 08/29/19 1150 08/29/19 2234 08/30/19 0501  BP:  (!) 148/88 118/83 117/88  Pulse:  62 67 77  Resp:  18 16 20   Temp: (!) 97.4 F (36.3 C) 97.7 F (36.5 C) 98.4 F (36.9 C) 98 F (36.7 C)  TempSrc: Oral  Oral Oral  SpO2:  92% 96% 95%  Weight:    80.8 kg  Height:  Intake/Output Summary (Last 24 hours) at 08/30/2019 0740 Last data filed at 08/30/2019 0700 Gross per 24 hour  Intake 868.46 ml  Output 275 ml  Net 593.46 ml   Filed Weights   2019/09/01 0900 2019/09/01 1112 08/30/19 0501  Weight: 77.5 kg 78.9 kg 80.8 kg    Examination:  General:53 y.o.maleresting in bed in NAD, in good spirits this AM Eyes: PERRL, normal sclera ENMT: Nares patent w/o discharge, orophaynx clear, dentition normal, ears w/o discharge/lesions/ulcers Cardiovascular: RRR, +S1, S2, no m/g/r Respiratory: CTABL, no w/r/r GI: BS+, NDNT, no masses noted, no organomegaly noted MSK: No e/c/c Neuro: A&O x 3, no focal deficits   Data Reviewed: I have personally reviewed following labs and imaging studies.  CBC: Recent Labs  Lab  Sep 01, 2019 0050 09-01-19 1515 08/29/19 0236 08/29/19 2012 08/30/19 0447  WBC 8.6  --  5.7  --  4.2  NEUTROABS  --   --   --   --  2.7  HGB 6.2* 8.9* 7.7* 7.2* 7.2*  HCT 20.6* 27.1* 25.4* 23.9* 23.8*  MCV 108.4*  --  102.8*  --  103.5*  PLT 134*  --  94*  --  91*   Basic Metabolic Panel: Recent Labs  Lab Sep 01, 2019 0050 08/29/19 0236 08/30/19 0447  NA 140 140 137  K 3.9 4.1 3.8  CL 106 110 109  CO2 24 23 23   GLUCOSE 117* 94 98  BUN 29* 24* 15  CREATININE 1.33* 1.14 1.07  CALCIUM 8.2* 7.3* 7.4*  MG  --   --  1.4*  PHOS  --   --  2.6   GFR: Estimated Creatinine Clearance: 92.3 mL/min (by C-G formula based on SCr of 1.07 mg/dL). Liver Function Tests: Recent Labs  Lab 09/01/2019 0050 08/30/19 0447  AST 14*  --   ALT 17  --   ALKPHOS 64  --   BILITOT 0.4  --   PROT 5.8*  --   ALBUMIN 2.8* 2.4*   Recent Labs  Lab 09/01/19 0019  LIPASE 17   No results for input(s): AMMONIA in the last 168 hours. Coagulation Profile: Recent Labs  Lab 09-01-2019 0159  INR 1.2   Cardiac Enzymes: No results for input(s): CKTOTAL, CKMB, CKMBINDEX, TROPONINI in the last 168 hours. BNP (last 3 results) No results for input(s): PROBNP in the last 8760 hours. HbA1C: No results for input(s): HGBA1C in the last 72 hours. CBG: No results for input(s): GLUCAP in the last 168 hours. Lipid Profile: No results for input(s): CHOL, HDL, LDLCALC, TRIG, CHOLHDL, LDLDIRECT in the last 72 hours. Thyroid Function Tests: No results for input(s): TSH, T4TOTAL, FREET4, T3FREE, THYROIDAB in the last 72 hours. Anemia Panel: No results for input(s): VITAMINB12, FOLATE, FERRITIN, TIBC, IRON, RETICCTPCT in the last 72 hours. Sepsis Labs: No results for input(s): PROCALCITON, LATICACIDVEN in the last 168 hours.  Recent Results (from the past 240 hour(s))  SARS Coronavirus 2 [facility] order, Performed in [facility] hospital lab) Nasopharyngeal Nasopharyngeal Swab     Status: None   Collection Time:  09-01-19  3:05 AM   Specimen: Nasopharyngeal Swab  Result Value Ref Range Status   SARS Coronavirus 2 NEGATIVE NEGATIVE Final    Comment: (NOTE) If result is NEGATIVE SARS-CoV-2 target nucleic acids are NOT DETECTED. The SARS-CoV-2 RNA is generally detectable in upper and lower  respiratory specimens during the acute phase of infection. The lowest  concentration of SARS-CoV-2 viral copies this assay can detect is 250  copies / mL. A  negative result does not preclude SARS-CoV-2 infection  and should not be used as the sole basis for treatment or other  patient management decisions.  A negative result may occur with  improper specimen collection / handling, submission of specimen other  than nasopharyngeal swab, presence of viral mutation(s) within the  areas targeted by this assay, and inadequate number of viral copies  (<250 copies / mL). A negative result must be combined with clinical  observations, patient history, and epidemiological information. If result is POSITIVE SARS-CoV-2 target nucleic acids are DETECTED. The SARS-CoV-2 RNA is generally detectable in upper and lower  respiratory specimens dur ing the acute phase of infection.  Positive  results are indicative of active infection with SARS-CoV-2.  Clinical  correlation with patient history and other diagnostic information is  necessary to determine patient infection status.  Positive results do  not rule out bacterial infection or co-infection with other viruses. If result is PRESUMPTIVE POSTIVE SARS-CoV-2 nucleic acids MAY BE PRESENT.   A presumptive positive result was obtained on the submitted specimen  and confirmed on repeat testing.  While 2019 novel coronavirus  (SARS-CoV-2) nucleic acids may be present in the submitted sample  additional confirmatory testing may be necessary for epidemiological  and / or clinical management purposes  to differentiate between  SARS-CoV-2 and other Sarbecovirus currently known to  infect humans.  If clinically indicated additional testing with an alternate test  methodology [phone]) is advised. The SARS-CoV-2 RNA is generally  detectable in upper and lower respiratory sp ecimens during the acute  phase of infection. The expected result is Negative. Fact Sheet for Patients:  StrictlyIdeas.no Fact Sheet for Healthcare Providers: BankingDealers.co.za This test is not yet approved or cleared by the [place] FDA and has been authorized for detection and/or diagnosis of SARS-CoV-2 by FDA under an Emergency Use Authorization (EUA).  This EUA will remain in effect (meaning this test can be used) for the duration of the COVID-19 declaration under Section 564(b)(1) of the Act, 21 U.S.C. section 360bbb-3(b)(1), unless the authorization is terminated or revoked sooner. Performed at [facility] [place] [address], [place], [place] [address]   MRSA PCR Screening     Status: Abnormal   Collection Time: 08/28/19  5:21 AM   Specimen: Nasal Mucosa; Nasopharyngeal  Result Value Ref Range Status   MRSA by PCR POSITIVE (A) NEGATIVE Final    Comment:        The GeneXpert MRSA Assay (FDA approved for NASAL specimens only), is one component of a comprehensive MRSA colonization surveillance program. It is not intended to diagnose MRSA infection nor to guide or monitor treatment for MRSA infections. CRITICAL RESULT CALLED TO, READ BACK BY AND VERIFIED WITH: RN Tyson Dense AT 8119 08/28/19 CRUICKSHANK A Performed at [facility], [place] [address], [place], [place] [address]       Radiology Studies: No results found.   Scheduled Meds: . sodium chloride   Intravenous Once  . sodium chloride   Intravenous Once  . Chlorhexidine Gluconate Cloth  6 each Topical Daily  . Chlorhexidine Gluconate Cloth  6 each Topical Q0600  . DULoxetine  60 mg Oral BID  . gabapentin  300 mg Oral TID  . magnesium  oxide  400 mg Oral BID  . mupirocin ointment  1 application Nasal BID   Continuous Infusions: . pantoprozole (PROTONIX) infusion 8 mg/hr (08/29/19 2246)     LOS: 2 days    Time spent: 25 minutes  spent in the coordination of care today.    Jonnie Finner, DO [facility] Pager [phone]  If 7PM-7AM, please contact night-coverage [email or web address] Password [email or web address] 08/30/2019, 7:40 AM

## 2019-08-30 NOTE — Progress Notes (Signed)
     Progress Note    ASSESSMENT AND PLAN:    1. UGIB on Xarelto   EGD this admission - no evidence for recent or active bleeding at time of EGD but bleeding likely secondary to LA Grade C esophagitis / erosive gastropathy.  No further bleeding. Brown stool yesterday, no BMs today.  --Awaiting biopsies --Continue Carafate slurry QID x 8 weeks --Continue BID PPI x 8 weeks. --Avoid NSAIDS  --Should be okay to resume Xarelto today.  --Advance diet to solids --follow up with me on 9/22 at 9am  2. ABL anemia. Hgb 10.9 >>> 6.2. Received 2 uPRBC with bump in hgb to 7.7. Hgb has since drifted slightly to 7.2 but stable in that range.  3. Colon cancer screening.  --last colonoscopy 2009. When he comes for hospital follow up I will schedule outpatient colonoscopy   SUBJECTIVE   No complaints. No further GI bleeding. Hungry, still on liquids  OBJECTIVE:     Vital signs in last 24 hours: Temp:  [97.7 F (36.5 C)-98.4 F (36.9 C)] 98 F (36.7 C) (09/08 0501) Pulse Rate:  [62-77] 77 (09/08 0501) Resp:  [16-20] 20 (09/08 0501) BP: (117-148)/(83-88) 117/88 (09/08 0501) SpO2:  [92 %-96 %] 95 % (09/08 0501) Weight:  [80.8 kg] 80.8 kg (09/08 0501) Last BM Date: 08/27/19 General:   Alert, well-developed male in NAD EENT:  Normal hearing, non icteric sclera, conjunctive pink.  Heart:  Regular rate and rhythm;  No lower extremity edema   Pulm: Normal respiratory effort Abdomen:  Soft, nondistended, nontender.  Normal bowel sounds.  Neurologic:  Alert and  oriented x4;  grossly normal neurologically. Psych:  Pleasant, cooperative.  Normal mood and affect.   Intake/Output from previous day: 09/07 0701 - 09/08 0700 In: 868.5 [P.O.:240; I.V.:628.5] Out: 275 [Urine:275] Intake/Output this shift: No intake/output data recorded.  Lab Results: Recent Labs    08/28/19 0050  08/29/19 0236 08/29/19 2012 08/30/19 0447  WBC 8.6  --  5.7  --  4.2  HGB 6.2*   < > 7.7* 7.2* 7.2*  HCT 20.6*    < > 25.4* 23.9* 23.8*  PLT 134*  --  94*  --  91*   < > = values in this interval not displayed.   BMET Recent Labs    08/28/19 0050 08/29/19 0236 08/30/19 0447  NA 140 140 137  K 3.9 4.1 3.8  CL 106 110 109  CO2 24 23 23   GLUCOSE 117* 94 98  BUN 29* 24* 15  CREATININE 1.33* 1.14 1.07  CALCIUM 8.2* 7.3* 7.4*   LFT Recent Labs    08/28/19 0050 08/30/19 0447  PROT 5.8*  --   ALBUMIN 2.8* 2.4*  AST 14*  --   ALT 17  --   ALKPHOS 64  --   BILITOT 0.4  --    PT/INR Recent Labs    08/28/19 0159  LABPROT 15.4*  INR 1.2      Active Problems:   Diabetes mellitus with diabetic neuropathy, with long-term current use of insulin (HCC)   Benign essential HTN   Symptomatic anemia   Pulmonary embolus, left (HCC)   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

## 2019-08-31 DIAGNOSIS — C799 Secondary malignant neoplasm of unspecified site: Secondary | ICD-10-CM

## 2019-08-31 DIAGNOSIS — C649 Malignant neoplasm of unspecified kidney, except renal pelvis: Secondary | ICD-10-CM

## 2019-08-31 LAB — CBC WITH DIFFERENTIAL/PLATELET
Abs Immature Granulocytes: 0.02 10*3/uL (ref 0.00–0.07)
Basophils Absolute: 0 10*3/uL (ref 0.0–0.1)
Basophils Relative: 1 %
Eosinophils Absolute: 0.1 10*3/uL (ref 0.0–0.5)
Eosinophils Relative: 2 %
HCT: 25.1 % — ABNORMAL LOW (ref 39.0–52.0)
Hemoglobin: 7.5 g/dL — ABNORMAL LOW (ref 13.0–17.0)
Immature Granulocytes: 1 %
Lymphocytes Relative: 28 %
Lymphs Abs: 1.1 10*3/uL (ref 0.7–4.0)
MCH: 31.5 pg (ref 26.0–34.0)
MCHC: 29.9 g/dL — ABNORMAL LOW (ref 30.0–36.0)
MCV: 105.5 fL — ABNORMAL HIGH (ref 80.0–100.0)
Monocytes Absolute: 0.3 10*3/uL (ref 0.1–1.0)
Monocytes Relative: 7 %
Neutro Abs: 2.6 10*3/uL (ref 1.7–7.7)
Neutrophils Relative %: 61 %
Platelets: 101 10*3/uL — ABNORMAL LOW (ref 150–400)
RBC: 2.38 MIL/uL — ABNORMAL LOW (ref 4.22–5.81)
RDW: 19.5 % — ABNORMAL HIGH (ref 11.5–15.5)
WBC: 4.1 10*3/uL (ref 4.0–10.5)
nRBC: 0 % (ref 0.0–0.2)

## 2019-08-31 LAB — RENAL FUNCTION PANEL
Albumin: 2.3 g/dL — ABNORMAL LOW (ref 3.5–5.0)
Anion gap: 7 (ref 5–15)
BUN: 12 mg/dL (ref 6–20)
CO2: 23 mmol/L (ref 22–32)
Calcium: 7.8 mg/dL — ABNORMAL LOW (ref 8.9–10.3)
Chloride: 109 mmol/L (ref 98–111)
Creatinine, Ser: 1.09 mg/dL (ref 0.61–1.24)
GFR calc Af Amer: >60 mL/min (ref >60)
GFR calc non Af Amer: >60 mL/min (ref >60)
Glucose, Bld: 106 mg/dL — ABNORMAL HIGH (ref 70–99)
Phosphorus: 2.5 mg/dL (ref 2.5–4.6)
Potassium: 4 mmol/L (ref 3.5–5.1)
Sodium: 139 mmol/L (ref 135–145)

## 2019-08-31 LAB — MAGNESIUM: Magnesium: 1.7 mg/dL (ref 1.7–2.4)

## 2019-08-31 MED ORDER — FERROUS SULFATE 325 (65 FE) MG PO TABS: 325.0000 mg | ORAL_TABLET | Freq: Every day | ORAL | 0 refills | Status: DC

## 2019-08-31 MED ORDER — SUCRALFATE 1 GM/10ML PO SUSP: 1.0000 g | Freq: Four times a day (QID) | ORAL | 0 refills | Status: DC

## 2019-08-31 MED ORDER — PANTOPRAZOLE SODIUM 40 MG PO TBEC: 40.0000 mg | DELAYED_RELEASE_TABLET | Freq: Two times a day (BID) | ORAL | 0 refills | Status: DC

## 2019-08-31 MED ORDER — SENNOSIDES-DOCUSATE SODIUM 8.6-50 MG PO TABS: 2.0000 | ORAL_TABLET | Freq: Every evening | ORAL | 2 refills | Status: AC | PRN

## 2019-08-31 NOTE — Plan of Care (Signed)

## 2019-08-31 NOTE — Progress Notes (Signed)
Keith [name] to be D/C'd Home per MD order.  Discussed prescriptions and follow up appointments with the patient. Prescriptions given to patient, medication list explained in detail. Pt verbalized understanding.  Allergies as of 08/31/2019      Reactions   Ceftriaxone Hives   Hydrocodone Swelling      Medication List    STOP taking these medications   diphenoxylate-atropine 2.5-0.025 MG tablet Commonly known as: LOMOTIL   furosemide 20 MG tablet Commonly known as: LASIX   ibuprofen 200 MG tablet Commonly known as: ADVIL     TAKE these medications   ALPRAZolam 1 MG tablet Commonly known as: XANAX Take 1 tablet (1 mg total) by mouth 3 (three) times daily as needed for anxiety.   Cabometyx 60 MG tablet Generic drug: cabozantinib Take 1 tablet (60 mg total) by mouth daily. With at least 8 ounces of water on an empty stomach. Do not eat for 2 hours before or 1 hour after. Swallow whole.   calcium carbonate 500 MG chewable tablet Commonly known as: TUMS - dosed in mg elemental calcium Chew 1 tablet by mouth 3 (three) times daily as needed for indigestion or heartburn.   DULoxetine 60 MG capsule Commonly known as: CYMBALTA Take 1 capsule (60 mg total) by mouth 2 (two) times daily.   ferrous sulfate 325 (65 FE) MG tablet Take 1 tablet (325 mg total) by mouth daily with breakfast. What changed: when to take this   gabapentin 300 MG capsule Commonly known as: NEURONTIN TAKE 1 CAPSULE BY MOUTH THREE TIMES A DAY What changed: See the new instructions.   HYDROmorphone 8 MG tablet Commonly known as: DILAUDID Take 1 tablet (8 mg total) by mouth every 4 (four) hours as needed for severe pain.   hydroxypropyl methylcellulose / hypromellose 2.5 % ophthalmic solution Commonly known as: ISOPTO TEARS / GONIOVISC Place 1 drop into both eyes 3 (three) times daily as needed for dry eyes.   loratadine 10 MG tablet Commonly known as: CLARITIN Take 10 mg by mouth daily.   megestrol  40 MG/ML suspension Commonly known as: MEGACE TAKE 10 MLS (400 MG TOTAL) BY MOUTH 2 (TWO) TIMES DAILY.   pantoprazole 40 MG tablet Commonly known as: PROTONIX Take 1 tablet (40 mg total) by mouth 2 (two) times daily before a meal.   rivaroxaban 20 MG Tabs tablet Commonly known as: Xarelto Take 1 tablet (20 mg total) by mouth daily with supper.   senna-docusate 8.6-50 MG tablet Commonly known as: Senokot-S Take 2 tablets by mouth at bedtime as needed for mild constipation or moderate constipation.   sucralfate 1 GM/10ML suspension Commonly known as: CARAFATE Take 10 mLs (1 g total) by mouth every 6 (six) hours.   tiZANidine 4 MG tablet Commonly known as: ZANAFLEX Take 1 tablet (4 mg total) by mouth every 8 (eight) hours as needed for muscle spasms.       Vitals:   08/30/19 2008 08/31/19 0557  BP: 117/80 121/78  Pulse: 67 75  Resp: 16 16  Temp: 98.2 F (36.8 C) 97.9 F (36.6 C)  SpO2: 96% 94%    Skin clean, dry and intact without evidence of skin break down, no evidence of skin tears noted. IV catheter discontinued intact. Site without signs and symptoms of complications. Dressing and pressure applied. Pt denies pain at this time. No complaints noted.  An After Visit Summary was printed and given to the patient. Patient escorted via [place], and D/C home via private auto.  Lolita Rieger 08/31/2019 10:31 AM

## 2019-08-31 NOTE — Discharge Summary (Signed)
Physician Discharge Summary  PENNY FRISBIE EPP:[id number] DOB: Apr 30, 1966 DOA: 08/27/2019  PCP: [name], MD  Admit date: 08/27/2019 Discharge date: 08/31/2019  Admitted From: Home Disposition: Home  Recommendations for Outpatient Follow-up:  1. Follow up with PCP in 1-2 weeks 2. Please obtain BMP/CBC in one week your next doctors visit.  3. PPI twice daily for [redacted] weeks along with Carafate.  Follow-up outpatient GI on 9/22   Discharge Condition: Stable CODE STATUS: Full Diet recommendation: 2 g salt  Brief/Interim Summary: 53 y.o.malewith medical history significant ofrenal cell carcinoma diagnosed 2009. Patient has undergone multiple treatments. 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 GI history significant for bright red per rectum approximately 5 years ago. He was seen by Dr. Lucio Edward, [facility], and underwent colonoscopy. He was told he had hemorrhoids. Patient had no significant GI problems otherwise. Patient has been on Xarelto for history of pulmonary embolism. Approximately 5 days ago the patient began to feel weak, developed epigastric abdominal pain and was anorexic. He reports that starting Friday he had black dark tarry stools that were painless. At that time he stopped taking Xarelto. Because of persistent black dark tarry stools shortness of breath,chest pain he came to the [facility] emergency department.  Upper endoscopy performed in the hospitalization showed grade C esophagitis/gastritis therefore recommended PPI twice daily, Carafate and discontinuing NSAIDs.  Follow-up outpatient GI on 9/22.  Cleared to resume Xarelto.    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  Status post endoscopy showing grade C esophagitis/gastritis.  Hemoglobin stable at 7.5 -Discontinue NSAID use.  PPI twice daily and Carafate -Resume Xarelto -Follow-up outpatient GI on 9/22.  History of metastatic renal cell carcinoma -Follow-up outpatient oncology  Essential hypertension -Resume home meds  History of pulmonary embolism -Resume Xarelto.  Consultations:  Gastroenterology  Subjective: No more bleeding overnight.  Feels much better.  No complaints.  Wishes to go home.  Discharge Exam: Vitals:   08/30/19 2008 08/31/19 0557  BP: 117/80 121/78  Pulse: 67 75  Resp: 16 16  Temp: 98.2 F (36.8 C) 97.9 F (36.6 C)  SpO2: 96% 94%   Vitals:   08/30/19 0501 08/30/19 1342 08/30/19 2008 08/31/19 0557  BP: 117/88 121/81 117/80 121/78  Pulse: 77 77 67 75  Resp: 20 20 16 16   Temp: 98 F (36.7 C) 98 F (36.7 C) 98.2 F (36.8 C) 97.9 F (36.6 C)  TempSrc: Oral  Oral   SpO2: 95% 96% 96% 94%  Weight: 80.8 kg     Height:        General: Pt is alert, awake, not in acute distress Cardiovascular: RRR, S1/S2 +, no rubs, no gallops Respiratory: CTA bilaterally, no wheezing, no rhonchi Abdominal: Soft, NT, ND, bowel sounds + Extremities: no edema, no cyanosis  Discharge Instructions   Allergies as of 08/31/2019      Reactions   Ceftriaxone Hives   Hydrocodone Swelling      Medication List    STOP taking these medications   diphenoxylate-atropine 2.5-0.025 MG tablet Commonly known as: LOMOTIL   furosemide 20 MG tablet Commonly  known as: LASIX   ibuprofen 200 MG tablet Commonly known as: ADVIL     TAKE these medications   ALPRAZolam 1 MG tablet Commonly known as: XANAX Take 1 tablet (1 mg total) by mouth 3 (three) times daily as needed for anxiety.   Cabometyx 60 MG tablet Generic drug: cabozantinib Take 1 tablet (60 mg total) by mouth  daily. With at least 8 ounces of water on an empty stomach. Do not eat for 2 hours before or 1 hour after. Swallow whole.   calcium carbonate 500 MG chewable tablet Commonly known as: TUMS - dosed in mg elemental calcium Chew 1 tablet by mouth 3 (three) times daily as needed for indigestion or heartburn.   DULoxetine 60 MG capsule Commonly known as: CYMBALTA Take 1 capsule (60 mg total) by mouth 2 (two) times daily.   ferrous sulfate 325 (65 FE) MG tablet Take 1 tablet (325 mg total) by mouth daily with breakfast. What changed: when to take this   gabapentin 300 MG capsule Commonly known as: NEURONTIN TAKE 1 CAPSULE BY MOUTH THREE TIMES A DAY What changed: See the new instructions.   HYDROmorphone 8 MG tablet Commonly known as: DILAUDID Take 1 tablet (8 mg total) by mouth every 4 (four) hours as needed for severe pain.   hydroxypropyl methylcellulose / hypromellose 2.5 % ophthalmic solution Commonly known as: ISOPTO TEARS / GONIOVISC Place 1 drop into both eyes 3 (three) times daily as needed for dry eyes.   loratadine 10 MG tablet Commonly known as: CLARITIN Take 10 mg by mouth daily.   megestrol 40 MG/ML suspension Commonly known as: MEGACE TAKE 10 MLS (400 MG TOTAL) BY MOUTH 2 (TWO) TIMES DAILY.   pantoprazole 40 MG tablet Commonly known as: PROTONIX Take 1 tablet (40 mg total) by mouth 2 (two) times daily before a meal.   rivaroxaban 20 MG Tabs tablet Commonly known as: Xarelto Take 1 tablet (20 mg total) by mouth daily with supper.   senna-docusate 8.6-50 MG tablet Commonly known as: Senokot-S Take 2 tablets by mouth at bedtime as needed for mild constipation or moderate constipation.   sucralfate 1 GM/10ML suspension Commonly known as: CARAFATE Take 10 mLs (1 g total) by mouth every 6 (six) hours.   tiZANidine 4 MG tablet Commonly known as: ZANAFLEX Take 1 tablet (4 mg total) by mouth every 8 (eight) hours as needed for muscle spasms.      Follow-up  Information    Willia Craze, NP Follow up on 09/13/2019.   Specialty: Gastroenterology Why: at 9am. Please arrive 10 minutes early Contact information: [place] [address] [phone]        [name], MD. Schedule an appointment as soon as possible for a visit in 1 week(s).   Specialty: Internal Medicine Contact information: [place] [address] [phone]          Allergies  Allergen Reactions  . Ceftriaxone Hives  . Hydrocodone Swelling    You were cared for by a hospitalist during your hospital stay. If you have any questions about your discharge medications or the care you received while you were in the hospital after you are discharged, you can call the unit and asked to speak with the hospitalist on call if the hospitalist that took care of you is not available. Once you are discharged, your primary care physician will handle any further medical issues. Please note that no refills for any discharge medications  will be authorized once you are discharged, as it is imperative that you return to your primary care physician (or establish a relationship with a primary care physician if you do not have one) for your aftercare needs so that they can reassess your need for medications and monitor your lab values.   Procedures/Studies: Dg Chest 2 View  Result Date: 08/28/2019 CLINICAL DATA:  Chest pain EXAM: CHEST - 2 VIEW COMPARISON:  11/10/2015 FINDINGS: Heart is normal size. Small right pleural effusions suspected. There is hyperinflation of the lungs compatible with COPD. Nodular airspace opacity in the right upper lobe is similar to prior study and CT. No acute confluent airspace opacity. No acute bony abnormality. IMPRESSION: COPD/chronic changes. Small right pleural effusion. Electronically Signed   By: Rolm Baptise M.D.   On: 08/28/2019 00:50      The results of significant diagnostics from this hospitalization  (including imaging, microbiology, ancillary and laboratory) are listed below for reference.     Microbiology: Recent Results (from the past 240 hour(s))  SARS Coronavirus 2 [facility] order, Performed in [facility] hospital lab) Nasopharyngeal Nasopharyngeal Swab     Status: None   Collection Time: 08/28/19  3:05 AM   Specimen: Nasopharyngeal Swab  Result Value Ref Range Status   SARS Coronavirus 2 NEGATIVE NEGATIVE Final    Comment: (NOTE) If result is NEGATIVE SARS-CoV-2 target nucleic acids are NOT DETECTED. The SARS-CoV-2 RNA is generally detectable in upper and lower  respiratory specimens during the acute phase of infection. The lowest  concentration of SARS-CoV-2 viral copies this assay can detect is 250  copies / mL. A negative result does not preclude SARS-CoV-2 infection  and should not be used as the sole basis for treatment or other  patient management decisions.  A negative result may occur with  improper specimen collection / handling, submission of specimen other  than nasopharyngeal swab, presence of viral mutation(s) within the  areas targeted by this assay, and inadequate number of viral copies  (<250 copies / mL). A negative result must be combined with clinical  observations, patient history, and epidemiological information. If result is POSITIVE SARS-CoV-2 target nucleic acids are DETECTED. The SARS-CoV-2 RNA is generally detectable in upper and lower  respiratory specimens dur ing the acute phase of infection.  Positive  results are indicative of active infection with SARS-CoV-2.  Clinical  correlation with patient history and other diagnostic information is  necessary to determine patient infection status.  Positive results do  not rule out bacterial infection or co-infection with other viruses. If result is PRESUMPTIVE POSTIVE SARS-CoV-2 nucleic acids MAY BE PRESENT.   A presumptive positive result was obtained on the submitted specimen  and confirmed on  repeat testing.  While 2019 novel coronavirus  (SARS-CoV-2) nucleic acids may be present in the submitted sample  additional confirmatory testing may be necessary for epidemiological  and / or clinical management purposes  to differentiate between  SARS-CoV-2 and other Sarbecovirus currently known to infect humans.  If clinically indicated additional testing with an alternate test  methodology [phone]) is advised. The SARS-CoV-2 RNA is generally  detectable in upper and lower respiratory sp ecimens during the acute  phase of infection. The expected result is Negative. Fact Sheet for Patients:  StrictlyIdeas.no Fact Sheet for Healthcare Providers: BankingDealers.co.za This test is not yet approved or cleared by the [place] FDA and has been authorized for detection and/or diagnosis of SARS-CoV-2 by FDA under an Emergency Use Authorization (EUA).  This  EUA will remain in effect (meaning this test can be used) for the duration of the COVID-19 declaration under Section 564(b)(1) of the Act, 21 U.S.C. section 360bbb-3(b)(1), unless the authorization is terminated or revoked sooner. Performed at [facility], [place] [address] [place], [place] [address]   MRSA PCR Screening     Status: Abnormal   Collection Time: 08/28/19  5:21 AM   Specimen: Nasal Mucosa; Nasopharyngeal  Result Value Ref Range Status   MRSA by PCR POSITIVE (A) NEGATIVE Final    Comment:        The GeneXpert MRSA Assay (FDA approved for NASAL specimens only), is one component of a comprehensive MRSA colonization surveillance program. It is not intended to diagnose MRSA infection nor to guide or monitor treatment for MRSA infections. CRITICAL RESULT CALLED TO, READ BACK BY AND VERIFIED WITH: RN Tyson Dense AT 3532 08/28/19 CRUICKSHANK A Performed at [facility], [place] [address] [place], [place] [address]      Labs: BNP (last 3  results) No results for input(s): BNP in the last 8760 hours. Basic Metabolic Panel: Recent Labs  Lab 08/28/19 0050 08/29/19 0236 08/30/19 0447 08/31/19 0502  NA 140 140 137 139  K 3.9 4.1 3.8 4.0  CL 106 110 109 109  CO2 24 23 23 23   GLUCOSE 117* 94 98 106*  BUN 29* 24* 15 12  CREATININE 1.33* 1.14 1.07 1.09  CALCIUM 8.2* 7.3* 7.4* 7.8*  MG  --   --  1.4* 1.7  PHOS  --   --  2.6 2.5   Liver Function Tests: Recent Labs  Lab 08/28/19 0050 08/30/19 0447 08/31/19 0502  AST 14*  --   --   ALT 17  --   --   ALKPHOS 64  --   --   BILITOT 0.4  --   --   PROT 5.8*  --   --   ALBUMIN 2.8* 2.4* 2.3*   Recent Labs  Lab 08/28/19 0019  LIPASE 17   No results for input(s): AMMONIA in the last 168 hours. CBC: Recent Labs  Lab 08/28/19 0050  08/29/19 0236 08/29/19 2012 08/30/19 0447 08/30/19 1644 08/30/19 2003 08/31/19 0502  WBC 8.6  --  5.7  --  4.2  --   --  4.1  NEUTROABS  --   --   --   --  2.7  --   --  2.6  HGB 6.2*   < > 7.7* 7.2* 7.2* 7.5* 7.2* 7.5*  HCT 20.6*   < > 25.4* 23.9* 23.8* 24.6* 24.0* 25.1*  MCV 108.4*  --  102.8*  --  103.5*  --   --  105.5*  PLT 134*  --  94*  --  91*  --   --  101*   < > = values in this interval not displayed.   Cardiac Enzymes: No results for input(s): CKTOTAL, CKMB, CKMBINDEX, TROPONINI in the last 168 hours. BNP: Invalid input(s): POCBNP CBG: No results for input(s): GLUCAP in the last 168 hours. D-Dimer No results for input(s): DDIMER in the last 72 hours. Hgb A1c Recent Labs    08/29/19 0236  HGBA1C <4.2*   Lipid Profile No results for input(s): CHOL, HDL, LDLCALC, TRIG, CHOLHDL, LDLDIRECT in the last 72 hours. Thyroid function studies No results for input(s): TSH, T4TOTAL, T3FREE, THYROIDAB in the last 72 hours.  Invalid input(s): FREET3 Anemia work up No results for input(s): VITAMINB12, FOLATE, FERRITIN, TIBC, IRON, RETICCTPCT  in the last 72 hours. Urinalysis    Component Value Date/Time   COLORURINE  YELLOW 11/12/2015 2228   APPEARANCEUR CLOUDY (A) 11/12/2015 2228   LABSPEC 1.014 11/12/2015 2228   PHURINE 6.0 11/12/2015 2228   GLUCOSEU NEGATIVE 11/12/2015 2228   HGBUR NEGATIVE 11/12/2015 2228   BILIRUBINUR NEGATIVE 11/12/2015 2228   KETONESUR NEGATIVE 11/12/2015 2228   PROTEINUR NEGATIVE 11/12/2015 2228   UROBILINOGEN 0.2 09/20/2015 1547   NITRITE NEGATIVE 11/12/2015 2228   LEUKOCYTESUR NEGATIVE 11/12/2015 2228   Sepsis Labs Invalid input(s): PROCALCITONIN,  WBC,  LACTICIDVEN Microbiology Recent Results (from the past 240 hour(s))  SARS Coronavirus 2 [facility] order, Performed in [facility] hospital lab) Nasopharyngeal Nasopharyngeal Swab     Status: None   Collection Time: 08/28/19  3:05 AM   Specimen: Nasopharyngeal Swab  Result Value Ref Range Status   SARS Coronavirus 2 NEGATIVE NEGATIVE Final    Comment: (NOTE) If result is NEGATIVE SARS-CoV-2 target nucleic acids are NOT DETECTED. The SARS-CoV-2 RNA is generally detectable in upper and lower  respiratory specimens during the acute phase of infection. The lowest  concentration of SARS-CoV-2 viral copies this assay can detect is 250  copies / mL. A negative result does not preclude SARS-CoV-2 infection  and should not be used as the sole basis for treatment or other  patient management decisions.  A negative result may occur with  improper specimen collection / handling, submission of specimen other  than nasopharyngeal swab, presence of viral mutation(s) within the  areas targeted by this assay, and inadequate number of viral copies  (<250 copies / mL). A negative result must be combined with clinical  observations, patient history, and epidemiological information. If result is POSITIVE SARS-CoV-2 target nucleic acids are DETECTED. The SARS-CoV-2 RNA is generally detectable in upper and lower  respiratory specimens dur ing the acute phase of infection.  Positive  results are indicative of active infection with  SARS-CoV-2.  Clinical  correlation with patient history and other diagnostic information is  necessary to determine patient infection status.  Positive results do  not rule out bacterial infection or co-infection with other viruses. If result is PRESUMPTIVE POSTIVE SARS-CoV-2 nucleic acids MAY BE PRESENT.   A presumptive positive result was obtained on the submitted specimen  and confirmed on repeat testing.  While 2019 novel coronavirus  (SARS-CoV-2) nucleic acids may be present in the submitted sample  additional confirmatory testing may be necessary for epidemiological  and / or clinical management purposes  to differentiate between  SARS-CoV-2 and other Sarbecovirus currently known to infect humans.  If clinically indicated additional testing with an alternate test  methodology [phone]) is advised. The SARS-CoV-2 RNA is generally  detectable in upper and lower respiratory sp ecimens during the acute  phase of infection. The expected result is Negative. Fact Sheet for Patients:  StrictlyIdeas.no Fact Sheet for Healthcare Providers: BankingDealers.co.za This test is not yet approved or cleared by the [place] FDA and has been authorized for detection and/or diagnosis of SARS-CoV-2 by FDA under an Emergency Use Authorization (EUA).  This EUA will remain in effect (meaning this test can be used) for the duration of the COVID-19 declaration under Section 564(b)(1) of the Act, 21 U.S.C. section 360bbb-3(b)(1), unless the authorization is terminated or revoked sooner. Performed at [facility], [place] [address] [place], [place] [address]   MRSA PCR Screening     Status: Abnormal   Collection Time: 08/28/19  5:21 AM   Specimen: Nasal  Mucosa; Nasopharyngeal  Result Value Ref Range Status   MRSA by PCR POSITIVE (A) NEGATIVE Final    Comment:        The GeneXpert MRSA Assay (FDA approved for NASAL specimens only),  is one component of a comprehensive MRSA colonization surveillance program. It is not intended to diagnose MRSA infection nor to guide or monitor treatment for MRSA infections. CRITICAL RESULT CALLED TO, READ BACK BY AND VERIFIED WITH: RN Tyson Dense AT 1497 08/28/19 CRUICKSHANK A Performed at [facility], [place] [address] [place], [place] [address]      Time coordinating discharge:  I have spent 35 minutes face to face with the patient and on the ward discussing the patients care, assessment, plan and disposition with other care givers. >50% of the time was devoted counseling the patient about the risks and benefits of treatment/Discharge disposition and coordinating care.   SIGNED:   Damita Lack, MD  [facility] 08/31/2019, 11:40 AM   If 7PM-7AM, please contact night-coverage [email or web address]

## 2019-09-06 DIAGNOSIS — R110 Nausea: Secondary | ICD-10-CM | POA: Diagnosis not present

## 2019-09-06 DIAGNOSIS — C642 Malignant neoplasm of left kidney, except renal pelvis: Secondary | ICD-10-CM | POA: Diagnosis not present

## 2019-09-06 DIAGNOSIS — D649 Anemia, unspecified: Secondary | ICD-10-CM | POA: Diagnosis not present

## 2019-09-06 DIAGNOSIS — E119 Type 2 diabetes mellitus without complications: Secondary | ICD-10-CM | POA: Diagnosis not present

## 2019-09-06 DIAGNOSIS — C649 Malignant neoplasm of unspecified kidney, except renal pelvis: Secondary | ICD-10-CM | POA: Diagnosis not present

## 2019-09-07 ENCOUNTER — Other Ambulatory Visit: Payer: Self-pay | Admitting: *Deleted

## 2019-09-07 ENCOUNTER — Telehealth: Payer: Self-pay | Admitting: *Deleted

## 2019-09-07 DIAGNOSIS — D649 Anemia, unspecified: Secondary | ICD-10-CM | POA: Diagnosis not present

## 2019-09-07 NOTE — Telephone Encounter (Signed)
Entered in error

## 2019-09-07 NOTE — Telephone Encounter (Signed)
Thank you for your attention to detail. He should  Use levoflaxcin 500 mg daily, amoxicillin 1g BID, bismuth subsalicylate 591 mg QID, and PPI BID x 14 days. Thank you.

## 2019-09-07 NOTE — Telephone Encounter (Signed)
High drug/drug interaction between Cabometyx and Clarithromycin. Please advise.

## 2019-09-08 ENCOUNTER — Other Ambulatory Visit: Payer: Self-pay | Admitting: *Deleted

## 2019-09-08 DIAGNOSIS — D649 Anemia, unspecified: Secondary | ICD-10-CM | POA: Diagnosis not present

## 2019-09-08 MED ORDER — LEVOFLOXACIN 500 MG PO TABS: 500.0000 mg | ORAL_TABLET | ORAL | 0 refills | Status: AC

## 2019-09-08 MED ORDER — DOXYCYCLINE HYCLATE 100 MG PO CAPS: ORAL_CAPSULE | ORAL | 0 refills | Status: DC

## 2019-09-08 MED ORDER — NITAZOXANIDE 500 MG PO TABS: 500.0000 mg | ORAL_TABLET | Freq: Two times a day (BID) | ORAL | 0 refills | Status: AC

## 2019-09-08 NOTE — Telephone Encounter (Signed)
The patient has an allergy to amoxicillin. Please advice if Metronidazole can be used as an alternative? Please clarify how long with patient will administer this therapy? Thank you.

## 2019-09-08 NOTE — Telephone Encounter (Signed)
Notified patient of biopsy results. Levofloxacin, Nitazoxanide, and Doxycycline scripts sent to pharmacy on file. Patient aware.

## 2019-09-08 NOTE — Telephone Encounter (Signed)
Levofloxacin 500mg  QAM x 10 days, nitazoxanide 500 mg BID with meals x 10 days, doxycycline 100 mg at dinner x 10 days, and PPI BID x 10 days. Thank you.

## 2019-09-09 ENCOUNTER — Telehealth: Payer: Self-pay | Admitting: Gastroenterology

## 2019-09-09 ENCOUNTER — Other Ambulatory Visit: Payer: Self-pay | Admitting: Oncology

## 2019-09-09 MED ORDER — ALPRAZOLAM 1 MG PO TABS: 1.0000 mg | ORAL_TABLET | Freq: Three times a day (TID) | ORAL | 0 refills | Status: DC | PRN

## 2019-09-09 NOTE — Telephone Encounter (Signed)
Pls call pt, he states that his insurance does not cover alprazolam and out-of-pocket cost is over $4,000. He is requesting something more affordable. He was able to pick up the other 2 prescriptions so he wants to know if he could being taking those or if he needs to wait for a replacement of alprazolam.

## 2019-09-12 NOTE — Telephone Encounter (Signed)
Left message on patients voicemail to call office.   

## 2019-09-12 NOTE — Telephone Encounter (Signed)
Patient had some confusion as to his antibiotics. He states he picked up 2 but the 3rd one, nitazonaxide (alinia) was over $100. He has not started taking the other antibiotics because he was not sure if he needed to take it all together to be effective. Please advise

## 2019-09-13 ENCOUNTER — Ambulatory Visit (INDEPENDENT_AMBULATORY_CARE_PROVIDER_SITE_OTHER): Payer: Medicare Other | Admitting: Nurse Practitioner

## 2019-09-13 ENCOUNTER — Encounter: Payer: Self-pay | Admitting: Nurse Practitioner

## 2019-09-13 ENCOUNTER — Other Ambulatory Visit: Payer: Self-pay

## 2019-09-13 VITALS — BP 150/90 | HR 87 | Temp 98.2°F | Ht 75.0 in | Wt 186.0 lb

## 2019-09-13 DIAGNOSIS — K922 Gastrointestinal hemorrhage, unspecified: Secondary | ICD-10-CM

## 2019-09-13 DIAGNOSIS — K2970 Gastritis, unspecified, without bleeding: Secondary | ICD-10-CM | POA: Diagnosis not present

## 2019-09-13 DIAGNOSIS — E119 Type 2 diabetes mellitus without complications: Secondary | ICD-10-CM | POA: Diagnosis not present

## 2019-09-13 DIAGNOSIS — Z1211 Encounter for screening for malignant neoplasm of colon: Secondary | ICD-10-CM

## 2019-09-13 DIAGNOSIS — D649 Anemia, unspecified: Secondary | ICD-10-CM | POA: Diagnosis not present

## 2019-09-13 DIAGNOSIS — B9681 Helicobacter pylori [H. pylori] as the cause of diseases classified elsewhere: Secondary | ICD-10-CM | POA: Diagnosis not present

## 2019-09-13 NOTE — Patient Instructions (Addendum)
If you are age 52 or older, your body mass index should be between 23-30. Your Body mass index is 23.25 kg/m. If this is out of the aforementioned range listed, please consider follow up with your Primary Care Provider.  If you are age 80 or younger, your body mass index should be between 19-25. Your Body mass index is 23.25 kg/m. If this is out of the aformentioned range listed, please consider follow up with your Primary Care Provider.   Continue Pantoprazole twice daily for six weeks.  Complete H pylor regimen.  Alinia samples given.  In six weeks, STOP Pantoprazole for two weeks then get H pylori test. [facility]. [facility] - [address] [place], [place]  Thank you for choosing me and [facility].   Tye Savoy, NP

## 2019-09-13 NOTE — Progress Notes (Signed)
Chief Complaint:    Hospital follow up  IMPRESSION and PLAN:    1. Recent admission for upper GI bleed on Xarelto, likely secondary to esophagitis. No further bleeding.  -Reflux changes on distal esophageal biopsies. Continue Carafate until Rx complete. Continue BID PPI for 6 weeks then daily ( after holding for 14 days for H.pylori eradication breath test)  2. H.pylori gastritis. Allergic to Amoxicillin. We called in 10 days of Doxycycline, Levofloxacin  and Alinia. Insurance wouldn't pay for Alinia, samples for 10 days given to him today   -Complete the 10 day course of antibiotics. Will arrange for breath tests off PPI to check for eradication    3. ABLA, s/p 2u PRBC. On oral iron. PCP following hgb, it was 8.3 last Thursday per patient.   4. Hx of PE, chronically anticoagulated    5. 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 He is s/p radical nephrectomy, stereotactic radiotherapy 2010, radiation of right sacral bone and left shoulder . Disease progression on chemotherapy. Currently on Cabometyx with imaging showing stable disease.  Followed by Dr. [name].   5. Thrombocytopenia, ? Chemo related.   6. Colon cancer screening. He is due for colonoscopy.  -In light of recent upper GI bleed with anemia as well as Stage IV RCC, I think we should hold off on screening colonoscopy for now but I will discuss with Dr. Ardis Hughs patient's primary GI. He hasn't had any bowel changes or blood in stools other than during upper GI bleed.   HPI:     Patient is a 53 yo male with PMH significant for metastatic RCC diagnosed in 2009. He has a hx of a PE, on chronic Xarelto.   Earlier this month patient presented to ED with melena and symptomatic anemia with hgb of 6.2. Inpatient EGD revealed LA Class C esophagitis ( likely source of bleeding) as well as gastritis and a hiatal hernia. He is here for hospital follow up. He received 2u PRBC on 9/6. Following that, hgb  remained in low to mid 7 range. He is taking iron, stools dark. PCP has been following hgb and per patient it was up to 8.3 last Thursday. Yesterday's CBC is pending. Keith [name] is taking Carafate and Protonix as prescribed. Esophageal biopsies c/w non-specific esophagitis.  Gastric biopsies >> gastritis with  H.pylori.  We called in antibiotics but regimen changed due to allergy to amoxicillin.  He picked up levofloxacin and doxycycline but insurance apparently would not pay for nitazoxanide 500 mg BID.   Keith [name] feels relatively well. Appetite is okay. Bowels moving okay. No blood in stools.   Review of systems:    Positive for swelling in bilateral lower extremities. No chest pain, no SOB, no fevers, no urinary sx   Past Medical History:  Diagnosis Date  . Diabetes mellitus without complication ([place])    diet controlled only  . left renal ca d'd 2008  . 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 Metastasis to bone [facility]) 2014/2016    Patient's surgical history, family medical history, social history, medications and allergies were all reviewed in Epic   Serum creatinine: 1.09 mg/dL 08/31/19 0502 Estimated creatinine clearance: 94.6 mL/min  Current Outpatient Medications  Medication Sig Dispense Refill  . ALPRAZolam (XANAX) 1 MG tablet Take 1 tablet (1 mg total) by mouth 3 (three) times daily as needed for anxiety. 90 tablet 0  . cabozantinib (  CABOMETYX) 60 MG tablet Take 1 tablet (60 mg total) by mouth daily. With at least 8 ounces of water on an empty stomach. Do not eat for 2 hours before or 1 hour after. Swallow whole. 30 tablet 0  . calcium carbonate (TUMS - DOSED IN MG ELEMENTAL CALCIUM) 500 MG chewable tablet Chew 1 tablet by mouth 3 (three) times daily as needed for indigestion or heartburn.     . doxycycline (VIBRAMYCIN) 100 MG capsule Take 1 tablet (100 mg) daily at dinner for 10 days. 10 capsule 0  . DULoxetine (CYMBALTA) 60 MG capsule Take 1 capsule  (60 mg total) by mouth 2 (two) times daily. 180 capsule 0  . ferrous sulfate 325 (65 FE) MG tablet Take 1 tablet (325 mg total) by mouth daily with breakfast. 60 tablet 0  . gabapentin (NEURONTIN) 300 MG capsule TAKE 1 CAPSULE BY MOUTH THREE TIMES A DAY (Patient taking differently: Take 300 mg by mouth 3 (three) times daily. ) 270 capsule 0  . HYDROmorphone (DILAUDID) 8 MG tablet Take 1 tablet (8 mg total) by mouth every 4 (four) hours as needed for severe pain. 30 tablet 0  . hydroxypropyl methylcellulose / hypromellose (ISOPTO TEARS / GONIOVISC) 2.5 % ophthalmic solution Place 1 drop into both eyes 3 (three) times daily as needed for dry eyes.    [name] levofloxacin (LEVAQUIN) 500 MG tablet Take 1 tablet (500 mg total) by mouth every morning for 10 days. 10 tablet 0  . loratadine (CLARITIN) 10 MG tablet Take 10 mg by mouth daily.    . megestrol (MEGACE) 40 MG/ML suspension TAKE 10 MLS (400 MG TOTAL) BY MOUTH 2 (TWO) TIMES DAILY. 240 mL 0  . nitazoxanide (ALINIA) 500 MG tablet Take 1 tablet (500 mg total) by mouth 2 (two) times daily with a meal for 10 days. 20 tablet 0  . pantoprazole (PROTONIX) 40 MG tablet Take 1 tablet (40 mg total) by mouth 2 (two) times daily before a meal. 60 tablet 0  . rivaroxaban (XARELTO) 20 MG TABS tablet Take 1 tablet (20 mg total) by mouth daily with supper. 90 tablet 3  . senna-docusate (SENOKOT-S) 8.6-50 MG tablet Take 2 tablets by mouth at bedtime as needed for mild constipation or moderate constipation. 30 tablet 2  . sucralfate (CARAFATE) 1 GM/10ML suspension Take 10 mLs (1 g total) by mouth every 6 (six) hours. 1200 mL 0  . tiZANidine (ZANAFLEX) 4 MG tablet Take 1 tablet (4 mg total) by mouth every 8 (eight) hours as needed for muscle spasms. 30 tablet 0   No current facility-administered medications for this visit.     Physical Exam:     BP (!) 150/90   Pulse 87   Temp 98.2 F (36.8 C)   Ht 6' 3"  (1.905 m)   Wt 186 lb (84.4 kg)   BMI 23.25 kg/m    GENERAL:  Pleasant male in NAD PSYCH: : Cooperative, normal affect EENT:  conjunctiva pink, mucous membranes moist, neck supple without masses CARDIAC:  RRR,  No murmur heard, no peripheral edema PULM: Normal respiratory effort, lungs CTA bilaterally, no wheezing ABDOMEN:  Nondistended, soft, nontender. No obvious masses, no hepatomegaly,  normal bowel sounds SKIN:  turgor, no lesions seen Musculoskeletal:  Normal muscle tone, normal strength NEURO: Alert and oriented x 3, no focal neurologic deficits   Tye Savoy , NP 09/13/2019, 8:45 AM

## 2019-09-14 NOTE — Progress Notes (Signed)
I agree with the above note, plan.  Given metastatic RCC I recommend against colon cancer screening at this point.

## 2019-09-14 NOTE — Telephone Encounter (Signed)
PG saw the patient in the office yesterday. Thank you.

## 2019-09-19 ENCOUNTER — Other Ambulatory Visit: Payer: Self-pay

## 2019-09-19 DIAGNOSIS — C649 Malignant neoplasm of unspecified kidney, except renal pelvis: Secondary | ICD-10-CM

## 2019-09-19 DIAGNOSIS — C7800 Secondary malignant neoplasm of unspecified lung: Secondary | ICD-10-CM

## 2019-09-19 DIAGNOSIS — C7951 Secondary malignant neoplasm of bone: Secondary | ICD-10-CM

## 2019-09-19 DIAGNOSIS — K8689 Other specified diseases of pancreas: Secondary | ICD-10-CM

## 2019-09-19 MED ORDER — CABOMETYX 60 MG PO TABS: 60.0000 mg | ORAL_TABLET | Freq: Every day | ORAL | 0 refills | Status: DC

## 2019-09-27 ENCOUNTER — Other Ambulatory Visit: Payer: Self-pay | Admitting: Oncology

## 2019-09-30 ENCOUNTER — Telehealth: Payer: Self-pay

## 2019-09-30 NOTE — Telephone Encounter (Signed)
Oral Oncology Patient Advocate Encounter  [employer] sent me a fax stating that they have tried to reach the patient multiple times and have not received a call back to refill his Cabometyx.  I called the patient and left a voicemail with [place] phone number to call and order his refill.  [place] [facility] [place] Phone [phone] Fax [phone] 09/30/2019   11:30 AM

## 2019-10-03 ENCOUNTER — Telehealth: Payer: Self-pay | Admitting: Oncology

## 2019-10-03 NOTE — Telephone Encounter (Signed)
Returned patient's phone call regarding rescheduling an appointment, left a voicemail. 

## 2019-10-17 DIAGNOSIS — G894 Chronic pain syndrome: Secondary | ICD-10-CM | POA: Diagnosis not present

## 2019-10-19 DIAGNOSIS — R030 Elevated blood-pressure reading, without diagnosis of hypertension: Secondary | ICD-10-CM | POA: Diagnosis not present

## 2019-10-19 DIAGNOSIS — G620 Drug-induced polyneuropathy: Secondary | ICD-10-CM | POA: Diagnosis not present

## 2019-10-19 DIAGNOSIS — Z9689 Presence of other specified functional implants: Secondary | ICD-10-CM | POA: Diagnosis not present

## 2019-10-19 DIAGNOSIS — G629 Polyneuropathy, unspecified: Secondary | ICD-10-CM | POA: Diagnosis not present

## 2019-10-19 DIAGNOSIS — D500 Iron deficiency anemia secondary to blood loss (chronic): Secondary | ICD-10-CM

## 2019-10-19 DIAGNOSIS — C642 Malignant neoplasm of left kidney, except renal pelvis: Secondary | ICD-10-CM | POA: Diagnosis not present

## 2019-10-26 ENCOUNTER — Other Ambulatory Visit: Payer: Self-pay | Admitting: Oncology

## 2019-10-26 ENCOUNTER — Telehealth: Payer: Self-pay | Admitting: Oncology

## 2019-10-26 NOTE — Telephone Encounter (Signed)
Returned patient's phone call regarding scheduling a follow-up visit, a follow-up appointment has been made on 11/10. Sent providers nurse a staff message for any further assistance.

## 2019-10-27 ENCOUNTER — Other Ambulatory Visit: Payer: Self-pay

## 2019-10-27 DIAGNOSIS — C7800 Secondary malignant neoplasm of unspecified lung: Secondary | ICD-10-CM

## 2019-10-27 DIAGNOSIS — C649 Malignant neoplasm of unspecified kidney, except renal pelvis: Secondary | ICD-10-CM

## 2019-10-27 DIAGNOSIS — C7951 Secondary malignant neoplasm of bone: Secondary | ICD-10-CM

## 2019-10-27 DIAGNOSIS — K8689 Other specified diseases of pancreas: Secondary | ICD-10-CM

## 2019-10-27 MED ORDER — CABOMETYX 60 MG PO TABS: 60.0000 mg | ORAL_TABLET | Freq: Every day | ORAL | 0 refills | Status: DC

## 2019-11-01 ENCOUNTER — Inpatient Hospital Stay: Payer: Medicare Other

## 2019-11-01 ENCOUNTER — Other Ambulatory Visit: Payer: Self-pay

## 2019-11-01 ENCOUNTER — Inpatient Hospital Stay: Payer: Medicare Other | Attending: Oncology | Admitting: Oncology

## 2019-11-01 VITALS — BP 145/90 | HR 84 | Temp 98.7°F | Resp 18 | Ht 75.0 in | Wt 175.5 lb

## 2019-11-01 DIAGNOSIS — K922 Gastrointestinal hemorrhage, unspecified: Secondary | ICD-10-CM | POA: Diagnosis not present

## 2019-11-01 DIAGNOSIS — C7951 Secondary malignant neoplasm of bone: Secondary | ICD-10-CM | POA: Diagnosis not present

## 2019-11-01 DIAGNOSIS — Z923 Personal history of irradiation: Secondary | ICD-10-CM | POA: Insufficient documentation

## 2019-11-01 DIAGNOSIS — I2699 Other pulmonary embolism without acute cor pulmonale: Secondary | ICD-10-CM | POA: Diagnosis not present

## 2019-11-01 DIAGNOSIS — Z7901 Long term (current) use of anticoagulants: Secondary | ICD-10-CM | POA: Diagnosis not present

## 2019-11-01 DIAGNOSIS — Z79899 Other long term (current) drug therapy: Secondary | ICD-10-CM | POA: Diagnosis not present

## 2019-11-01 DIAGNOSIS — C7800 Secondary malignant neoplasm of unspecified lung: Secondary | ICD-10-CM | POA: Diagnosis not present

## 2019-11-01 DIAGNOSIS — F419 Anxiety disorder, unspecified: Secondary | ICD-10-CM | POA: Diagnosis not present

## 2019-11-01 DIAGNOSIS — C649 Malignant neoplasm of unspecified kidney, except renal pelvis: Secondary | ICD-10-CM

## 2019-11-01 DIAGNOSIS — R197 Diarrhea, unspecified: Secondary | ICD-10-CM | POA: Diagnosis not present

## 2019-11-01 DIAGNOSIS — D500 Iron deficiency anemia secondary to blood loss (chronic): Secondary | ICD-10-CM | POA: Diagnosis not present

## 2019-11-01 LAB — CBC WITH DIFFERENTIAL (CANCER CENTER ONLY)
Abs Immature Granulocytes: 0.01 10*3/uL (ref 0.00–0.07)
Basophils Absolute: 0 10*3/uL (ref 0.0–0.1)
Basophils Relative: 1 %
Eosinophils Absolute: 0.2 10*3/uL (ref 0.0–0.5)
Eosinophils Relative: 4 %
HCT: 28.1 % — ABNORMAL LOW (ref 39.0–52.0)
Hemoglobin: 8 g/dL — ABNORMAL LOW (ref 13.0–17.0)
Immature Granulocytes: 0 %
Lymphocytes Relative: 22 %
Lymphs Abs: 0.9 10*3/uL (ref 0.7–4.0)
MCH: 28.6 pg (ref 26.0–34.0)
MCHC: 28.5 g/dL — ABNORMAL LOW (ref 30.0–36.0)
MCV: 100.4 fL — ABNORMAL HIGH (ref 80.0–100.0)
Monocytes Absolute: 0.3 10*3/uL (ref 0.1–1.0)
Monocytes Relative: 7 %
Neutro Abs: 2.7 10*3/uL (ref 1.7–7.7)
Neutrophils Relative %: 66 %
Platelet Count: 108 10*3/uL — ABNORMAL LOW (ref 150–400)
RBC: 2.8 MIL/uL — ABNORMAL LOW (ref 4.22–5.81)
RDW: 19.2 % — ABNORMAL HIGH (ref 11.5–15.5)
WBC Count: 4.1 10*3/uL (ref 4.0–10.5)
nRBC: 0 % (ref 0.0–0.2)

## 2019-11-01 LAB — CMP (CANCER CENTER ONLY)
ALT: 14 U/L (ref 0–44)
AST: 14 U/L — ABNORMAL LOW (ref 15–41)
Albumin: 2.6 g/dL — ABNORMAL LOW (ref 3.5–5.0)
Alkaline Phosphatase: 78 U/L (ref 38–126)
Anion gap: 6 (ref 5–15)
BUN: 8 mg/dL (ref 6–20)
CO2: 29 mmol/L (ref 22–32)
Calcium: 8 mg/dL — ABNORMAL LOW (ref 8.9–10.3)
Chloride: 106 mmol/L (ref 98–111)
Creatinine: 1.17 mg/dL (ref 0.61–1.24)
GFR, Est AFR Am: >60 mL/min (ref >60)
GFR, Estimated: >60 mL/min (ref >60)
Glucose, Bld: 123 mg/dL — ABNORMAL HIGH (ref 70–99)
Potassium: 3.7 mmol/L (ref 3.5–5.1)
Sodium: 141 mmol/L (ref 135–145)
Total Bilirubin: <0.2 mg/dL — ABNORMAL LOW (ref 0.3–1.2)
Total Protein: 6.2 g/dL — ABNORMAL LOW (ref 6.5–8.1)

## 2019-11-01 NOTE — Progress Notes (Signed)
Hematology and Oncology Follow Up Visit  Keith [name] [id number] 29-Jan-1966 53 y.o. 11/01/2019 3:18 PM  [name], MD  [name], M.D.  [name], MD  [name], MD    Principle Diagnosis: 53 year old man with renal cell carcinoma diagnosed in 2009.  He presented with stage IV disease including pulmonary involvement.  He subsequently developed bone disease.  Syncope intermittently   Prior Therapy: 1. Status post laparoscopic radical nephrectomy.  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  2. Patient status post thoracotomy for a synchronous metastatic lung lesions done October 2009.   3. Patient is status post stereotactic radiotherapy to pulmonary nodules in May of 2010. 4. He is S/P Sutent 50 mg 4 weeks on 2 weeks off from 10/2010 to 03/2013. He progressed at that time.  5. He is S/P radiation to the right sacral bone between 4/22 to 4/30.  6. He is S/P XRT to the left shoulder 03/20/14 to 03/31/14. 7. Votrient 800 mg by mouth daily from 03/2013 through 06/22/2015. Discontinued secondary to disease progression. 8. Nivolumab 3 mg/kg given every 2 weeks started on 06/29/2015. He is status post 4 cycles completed 08/10/2015. He developed disease progression in September 2016.  9. Status post radiation therapy to the left mid fibula completed on 11/14/2015. He received a grade 1 fraction. 10. He is status post radiation to the proximal and distal femur over 2 weeks and 10 fractions of total of 30 Gy.  This was completed in March 2019. 11.   Radiation therapy to the right tibia.  Last treatment completed in September 2019.    Current therapy:    Cabometyx 60 mg daily started in November 2016.     Interim History:  Keith [name] is here for a follow-up.  Since the last visit, he was hospitalized in September 2020 after presenting with anemia and found to have upper GI bleed related to gastritis and H. pylori.  He did receive packed red  cell transfusion with improvement of hemoglobin from 7.2 and currently has recovered.  He does report some fatigue and some tiredness at times but no dizziness or lightheadedness like he had before.  He continues to tolerate Cabometyx 60 mg daily without any new complaints.  He denies any excessive diarrhea, fatigue or anorexia.  He does report chronic pain predominantly related to his previous treated bone metastasis and his right tibia.  He denied headaches, blurry vision, syncope or seizures.  Denies any fevers, chills or sweats.  Denied chest pain, palpitation, orthopnea or leg edema.  Denied cough, wheezing or hemoptysis.  Denied nausea, vomiting or abdominal pain.  Denies any constipation or diarrhea.  Denies any frequency urgency or hesitancy.  Denies any arthralgias or myalgias.  Denies any skin rashes or lesions.  Denies any bleeding or clotting tendency.  Denies any easy bruising.  Denies any hair or nail changes.  Denies any anxiety or depression.  Remaining review of system is negative.        . .     Medications: Updated without any changes. Current Outpatient Medications  Medication Sig Dispense Refill  . ALPRAZolam (XANAX) 1 MG tablet TAKE 1 TABLET (1 MG TOTAL) BY MOUTH 3 (THREE) TIMES DAILY AS NEEDED FOR ANXIETY. 90 tablet 0  . cabozantinib (CABOMETYX) 60 MG tablet Take 1 tablet (60 mg total) by mouth daily. With at least 8 ounces of water on an empty stomach. Do not eat for 2 hours before or  1 hour after. Swallow whole. 30 tablet 0  . calcium carbonate (TUMS - DOSED IN MG ELEMENTAL CALCIUM) 500 MG chewable tablet Chew 1 tablet by mouth 3 (three) times daily as needed for indigestion or heartburn.     . doxycycline (VIBRAMYCIN) 100 MG capsule Take 1 tablet (100 mg) daily at dinner for 10 days. 10 capsule 0  . DULoxetine (CYMBALTA) 60 MG capsule TAKE 1 CAPSULE (60 MG TOTAL) BY MOUTH 2 (TWO) TIMES DAILY. 180 capsule 0  . ferrous sulfate 325 (65 FE) MG tablet Take 1 tablet (325  mg total) by mouth daily with breakfast. 60 tablet 0  . gabapentin (NEURONTIN) 300 MG capsule TAKE 1 CAPSULE BY MOUTH THREE TIMES A DAY (Patient taking differently: Take 300 mg by mouth 3 (three) times daily. ) 270 capsule 0  . HYDROmorphone (DILAUDID) 8 MG tablet Take 1 tablet (8 mg total) by mouth every 4 (four) hours as needed for severe pain. 30 tablet 0  . hydroxypropyl methylcellulose / hypromellose (ISOPTO TEARS / GONIOVISC) 2.5 % ophthalmic solution Place 1 drop into both eyes 3 (three) times daily as needed for dry eyes.    [name] loratadine (CLARITIN) 10 MG tablet Take 10 mg by mouth daily.    . megestrol (MEGACE) 40 MG/ML suspension TAKE 10 MLS (400 MG TOTAL) BY MOUTH 2 (TWO) TIMES DAILY. 240 mL 0  . pantoprazole (PROTONIX) 40 MG tablet Take 1 tablet (40 mg total) by mouth 2 (two) times daily before a meal. 60 tablet 0  . rivaroxaban (XARELTO) 20 MG TABS tablet Take 1 tablet (20 mg total) by mouth daily with supper. 90 tablet 3  . sucralfate (CARAFATE) 1 GM/10ML suspension Take 10 mLs (1 g total) by mouth every 6 (six) hours. 1200 mL 0  . tiZANidine (ZANAFLEX) 4 MG tablet Take 1 tablet (4 mg total) by mouth every 8 (eight) hours as needed for muscle spasms. 30 tablet 0   No current facility-administered medications for this visit.      Allergies:  Allergies  Allergen Reactions  . Ceftriaxone Hives  . Hydrocodone Swelling    Past Medical History, Surgical history, Social history, and Family History reviewed without changes.   Physical Exam:   Blood pressure (!) 145/90, pulse 84, temperature 98.7 F (37.1 C), temperature source Temporal, resp. rate 18, height 6\' 3"  (1.905 m), weight 175 lb 8 oz (79.6 kg), SpO2 100 %.     ECOG: 1    General appearance: Comfortable appearing without any discomfort Head: Normocephalic without any trauma Oropharynx: Mucous membranes are moist and pink without any thrush or ulcers. Eyes: Pupils are equal and round reactive to light. Lymph  nodes: No cervical, supraclavicular, inguinal or axillary lymphadenopathy.   Heart:regular rate and rhythm.  S1 and S2 without leg edema. Lung: Clear without any rhonchi or wheezes.  No dullness to percussion. Abdomin: Soft, nontender, nondistended with good bowel sounds.  No hepatosplenomegaly. Musculoskeletal: Tender to touch around the right tibia.  Mild ankle edema noted. Neurological: No deficits noted on motor, sensory and deep tendon reflex exam. Skin: No petechial rash or dryness.  Appeared moist.  Psychiatric: Mood and affect appeared appropriate.     .   .  Lab Results: Lab Results  Component Value Date   WBC 4.1 11/01/2019   HGB 8.0 (L) 11/01/2019   HCT 28.1 (L) 11/01/2019   MCV 100.4 (H) 11/01/2019   PLT 108 (L) 11/01/2019       Impression and Plan:  53 year old man  with  1.  Stage IV clear-cell renal cell carcinoma diagnosed in 2009.     He remains on Cabometyx with the same dose and schedule without any issues noted since the last visit.  Risks and benefits of continuing this therapy was reviewed today and potential complications were reiterated.  These include diarrhea, anorexia and fatigue.  Plan is to continue the same dose and schedule and repeat imaging studies in December 2020.  Different salvage therapy options will be used if he developed progression of disease.  This will include combined immunotherapy agents with ipilimumab and nivolumab, different oral targeted therapy using Inlyta or combination of the above.  2.  Pulmonary embolism: He remains on Xarelto which was stopped temporarily until his GI bleed has stopped and currently resumed.  3.  Anemia: Related to upper GI bleeding hemoglobin is improving at this time.  4.  Diarrhea: Manageable with Lomotil and Imodium at this time.  5.  Anxiety: He is currently managing with Cymbalta.  6.  Prognosis: Therapy remains palliative although aggressive measures are warranted given his reasonable  performance status.  7.  Follow-up: He will will return in 4 to 6 weeks for repeat imaging studies.   25  minutes was spent with the patient face-to-face today.  More than 50% of time was dedicated to updating his disease status, treatment options and discussing future plan of care.     Zola Button, MD 11/01/19

## 2019-11-24 IMAGING — MR MR FEMUR*R* WO/W CM
5 of 10 series · 19 of 40 positions shown · IV contrast (multihance)
Comparison: None.

CLINICAL DATA: Metastatic renal cell carcinoma.

EXAM:
MRI OF THE RIGHT FEMUR WITHOUT AND WITH CONTRAST
TECHNIQUE: Multiplanar, multisequence MR imaging of the right femur was
performed both before and after administration of intravenous
contrast.
CONTRAST:  17mL MULTIHANCE GADOBENATE DIMEGLUMINE 529 MG/ML IV SOLN

[Series 3: T1 · axial · 9.0mm · 0.84mm/px · z∈[-276,+263]mm · 5 of 50 slices shown (1 of 2)]
[im 1/50]
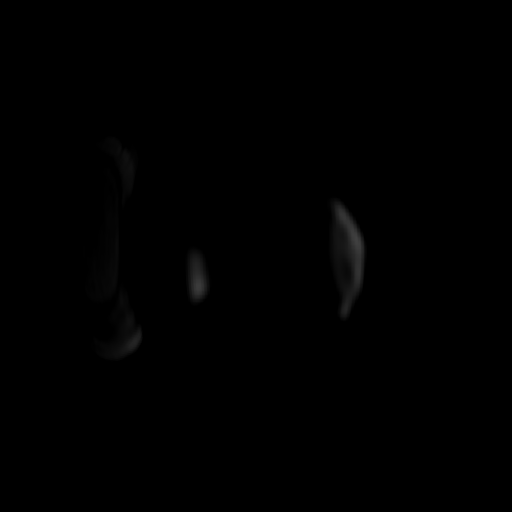
[im 13/50]
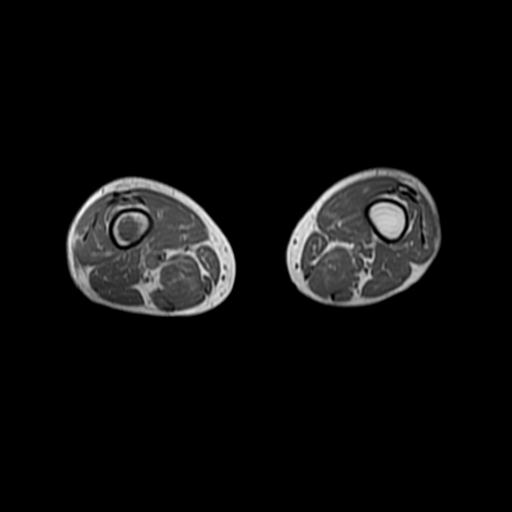
[im 25/50]
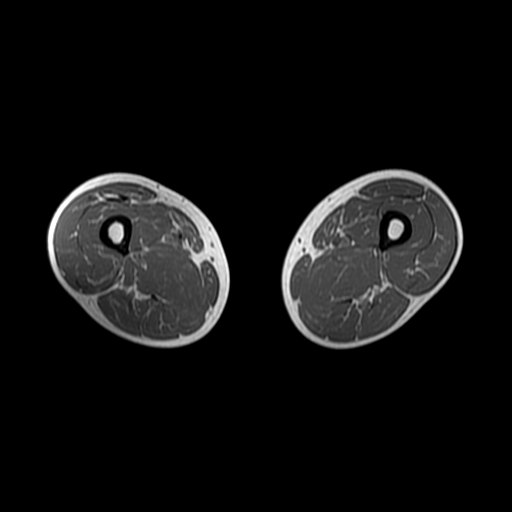
[im 37/50]
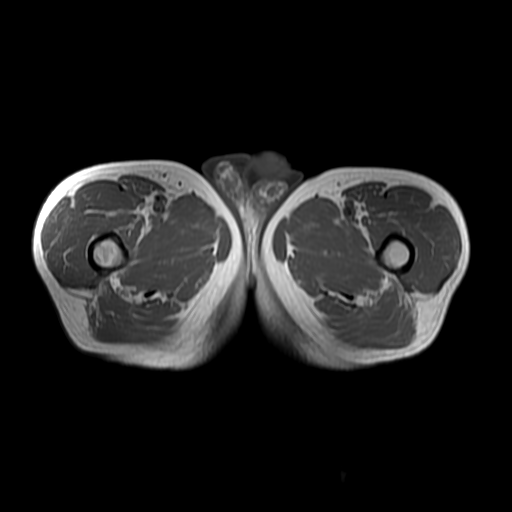
[im 50/50]
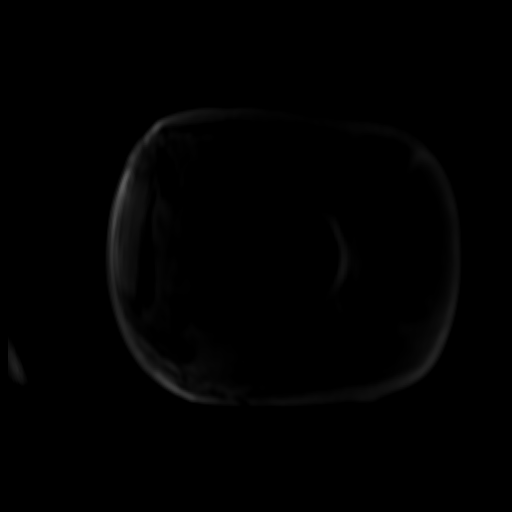

[Series 5: T2 fat-sat · axial · 9.0mm · 0.84mm/px · z∈[-276,+263]mm · 5 of 50 slices shown (1 of 2)]
[im 1/50]
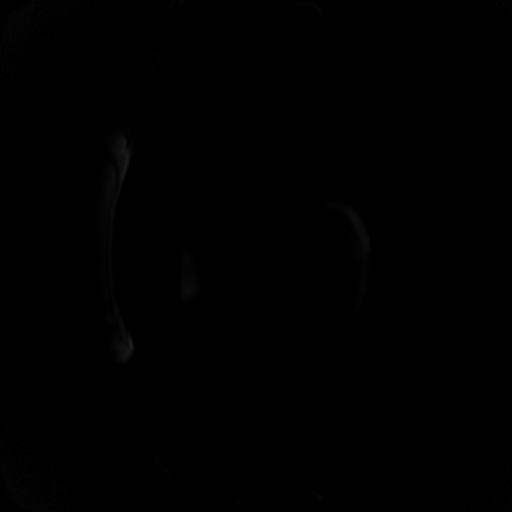
[im 13/50]
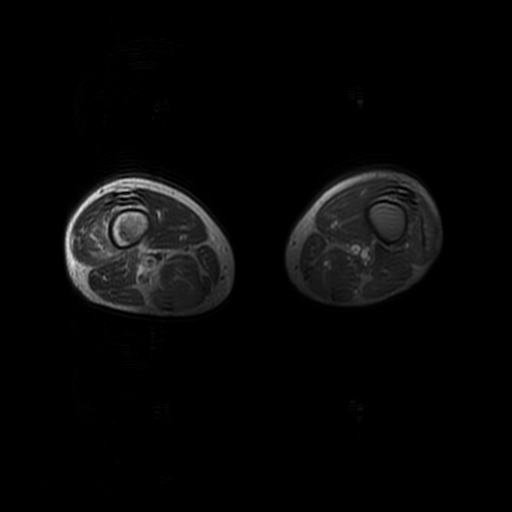
[im 25/50]
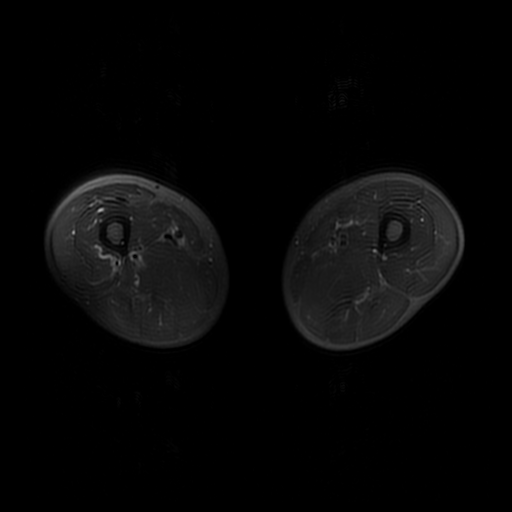
[im 37/50]
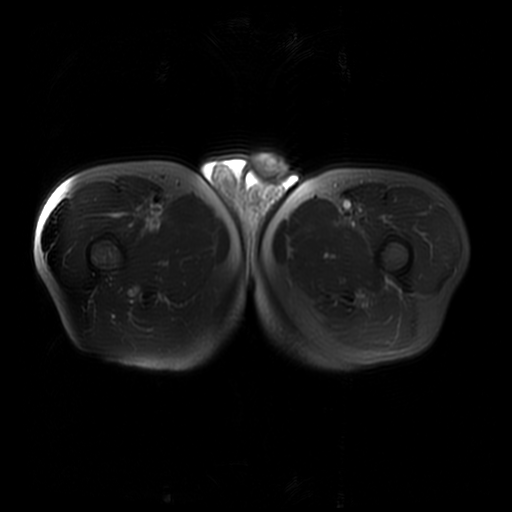
[im 50/50]
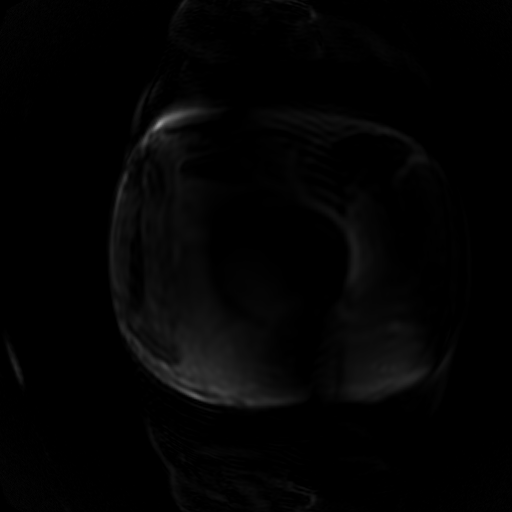

[Series 6: T1 · coronal · 5.0mm · 0.94mm/px · 3 of 31 slices shown (2 of 2)]
[im 1/31]
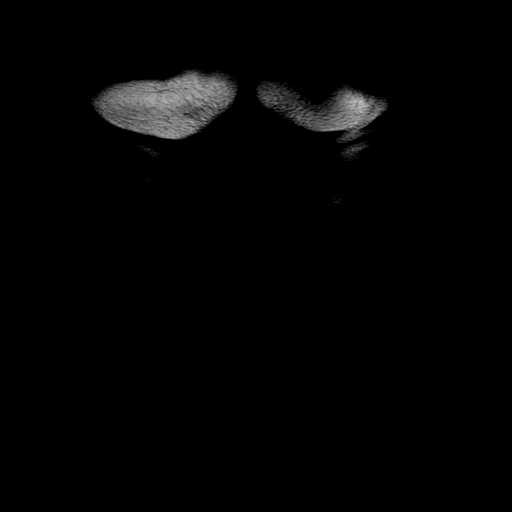
[im 16/31]
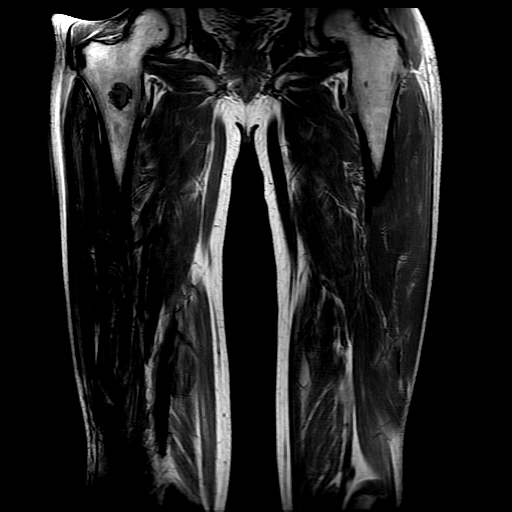
[im 31/31]
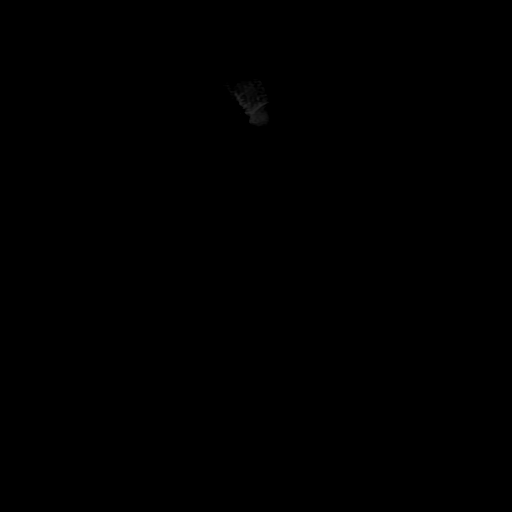

[Series 8: T2 fat-sat · sagittal · 5.0mm · 0.94mm/px · 3 of 33 slices shown (2 of 2)]
[im 1/33]
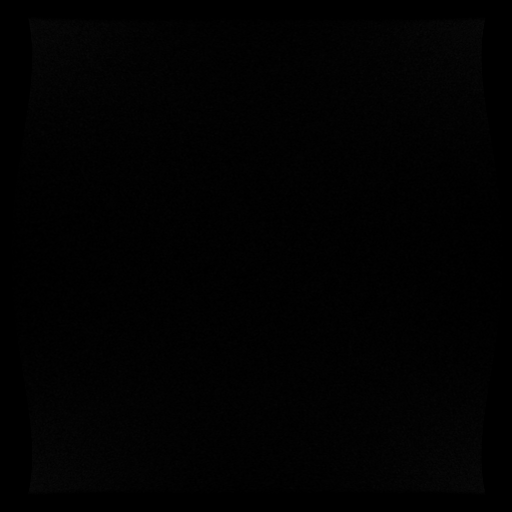
[im 17/33]
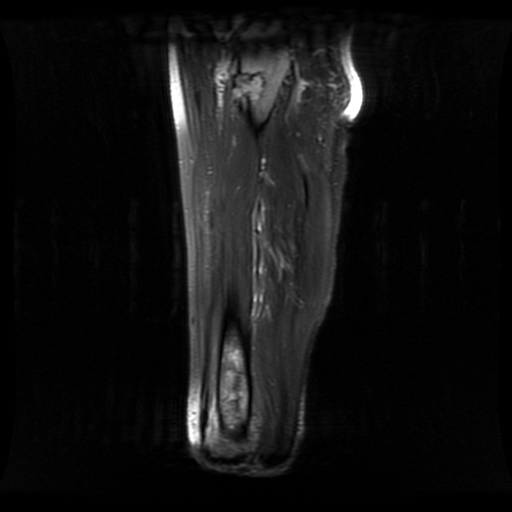
[im 33/33]
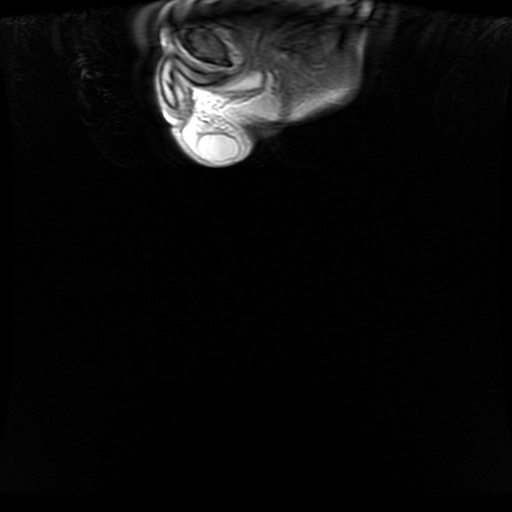

[Series 9: T1 fat-sat · axial · 9.0mm · 0.84mm/px · z∈[-276,-12]mm · 3 of 50 slices shown]
[im 1/50]
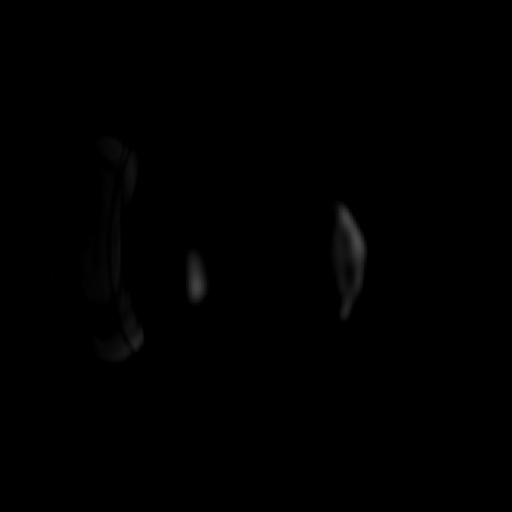
[im 13/50]
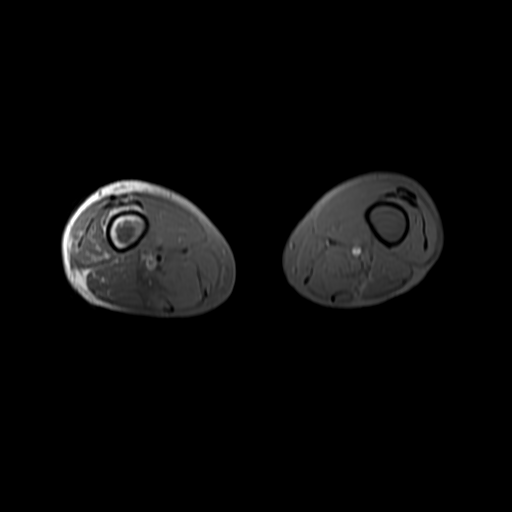
[im 25/50]
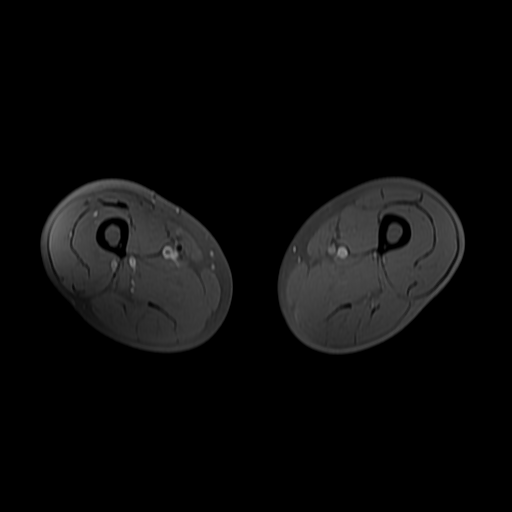

[19 of 40 positions shown; findings below may reference images not displayed]

FINDINGS: Bones/Joint/Cartilage

There is an irregular 3.8 x 3.1 x 3.1 cm destructive mass lesion in
the anterior aspect of the intertrochanteric region of the proximal
right femur with focal anterior cortical destruction.

There is a 6.8 x 3.5 x 3.1 cm destructive lesion in the distal right
femoral shaft just above the femoral condyles with disruption of the
posterior cortex with extension of tumor into the adjacent
anterolateral soft tissues.

There is irregular enhancement of both masses. The patient is at
risk for pathologic fractures at both sites.
IMPRESSION: 1. Metastatic lesions in the proximal and distal right femur with
cortical disruption at both sites putting the patient at risk for
pathologic fractures.
2. There is soft tissue extension of tumor anterior laterally at the
distal lesion.

## 2019-11-24 IMAGING — CT CT ABD-PEL WO/W CM
2 of 13 series · 10 of 46 positions shown, 16 images · IV contrast (ISOVUE 300)
Comparison: 07/28/2017.

CLINICAL DATA: Left renal cell carcinoma diagnosed in 6884. Lung
in progress. Radiation therapy complete. Left nephrectomy. Right
lung resection. No current complaints. Bone metastasis.

EXAM:
CT CHEST WITH CONTRAST
CT ABDOMEN AND PELVIS WITH AND WITHOUT CONTRAST
TECHNIQUE: Multidetector CT imaging of the chest was performed during
intravenous contrast administration. Multidetector CT imaging of the
abdomen and pelvis was performed following the standard protocol
before and during bolus administration of intravenous contrast.
CONTRAST:  100 cc of 9sovue-KBB

[Series 3: coronal pre · coronal · non-contrast · 0.57mm/px · 2 of 100 slices shown, 3 images]
[im 34/100  soft-tissue]
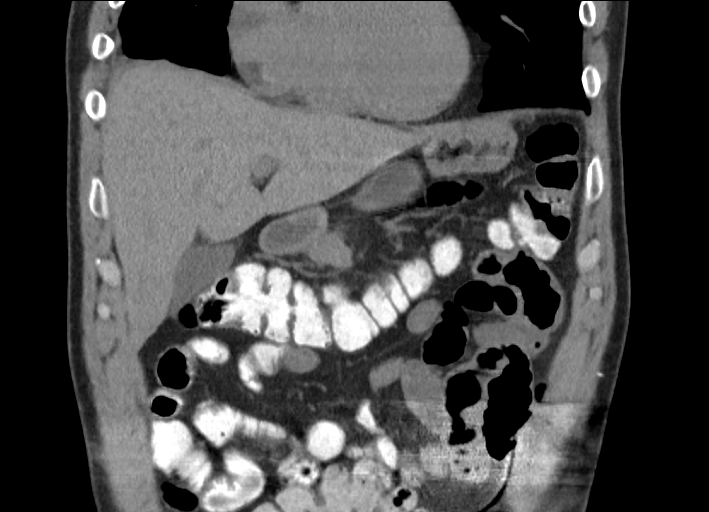
[im 34/100  bone]
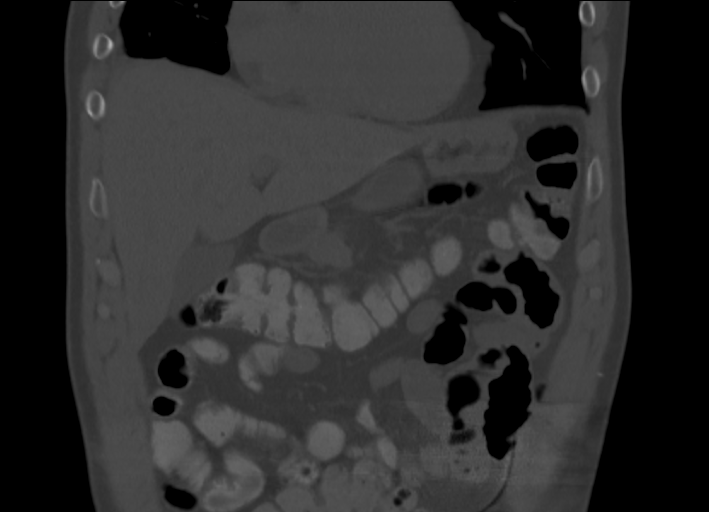
[im 67/100  soft-tissue]
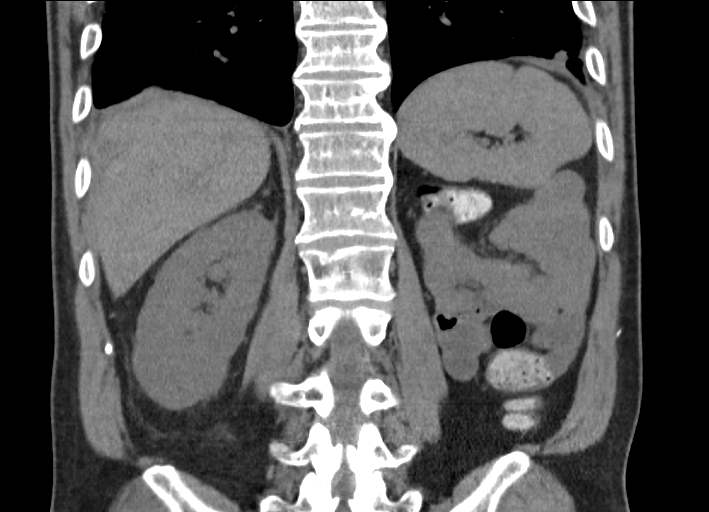

[Series 11: axial nephro · axial · 0.72mm/px · z∈[-623,-89]mm · 8 of 230 slices shown, 13 images]
[im 26/230  soft-tissue]
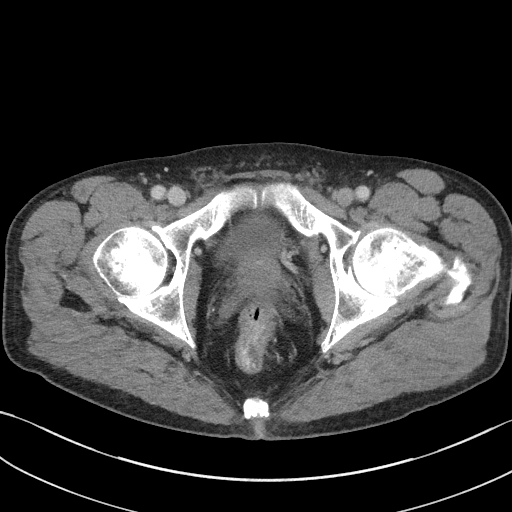
[im 26/230  bone]
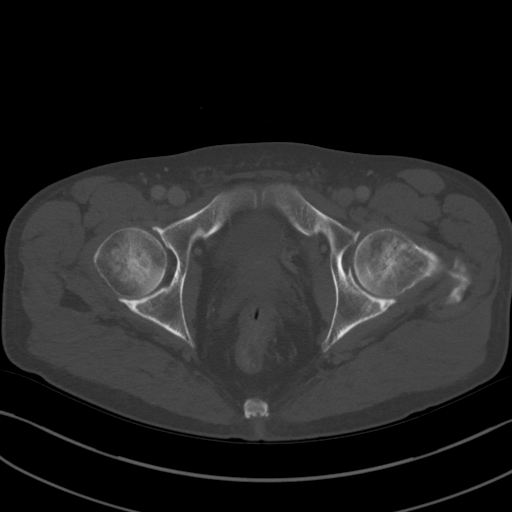
[im 51/230  soft-tissue]
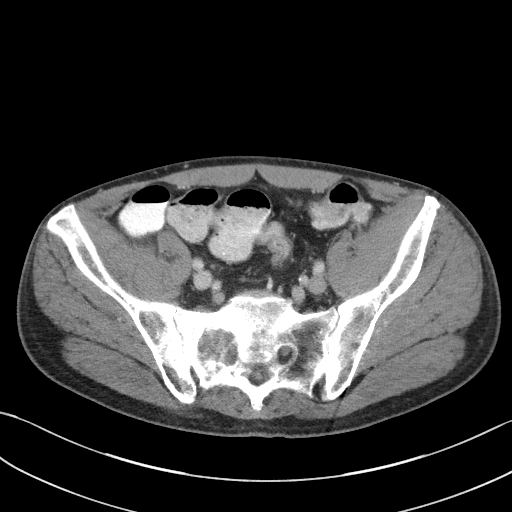
[im 77/230  soft-tissue]
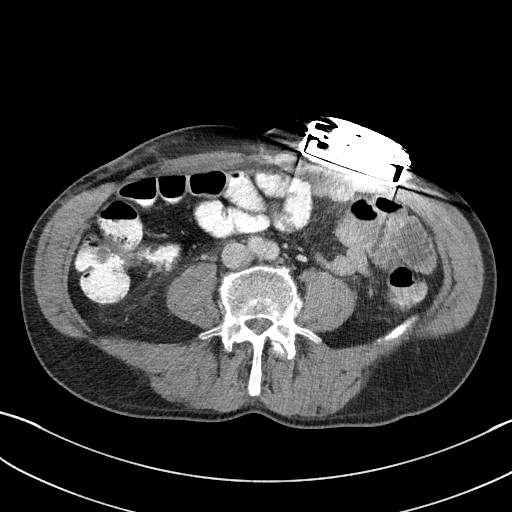
[im 102/230  soft-tissue]
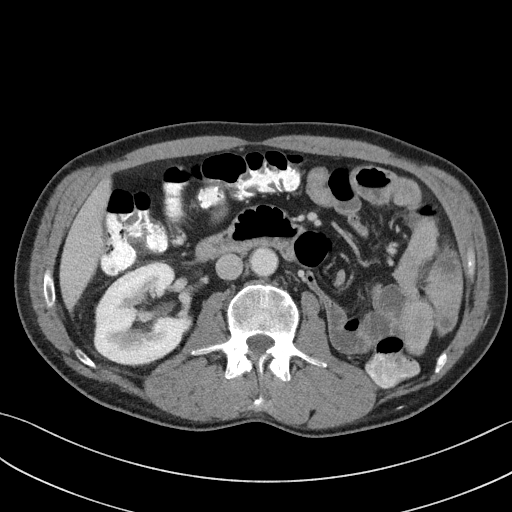
[im 128/230  soft-tissue]
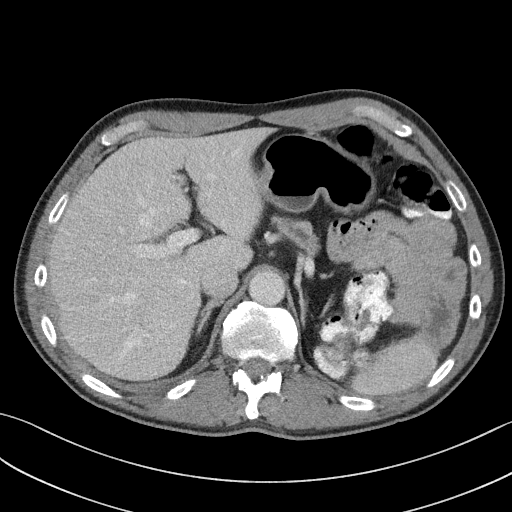
[im 128/230  lung]
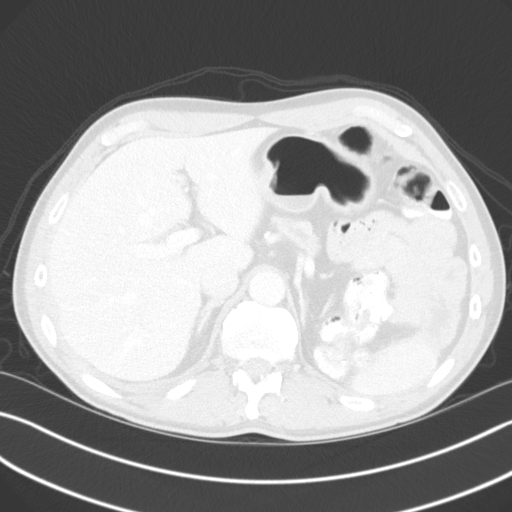
[im 153/230  soft-tissue]
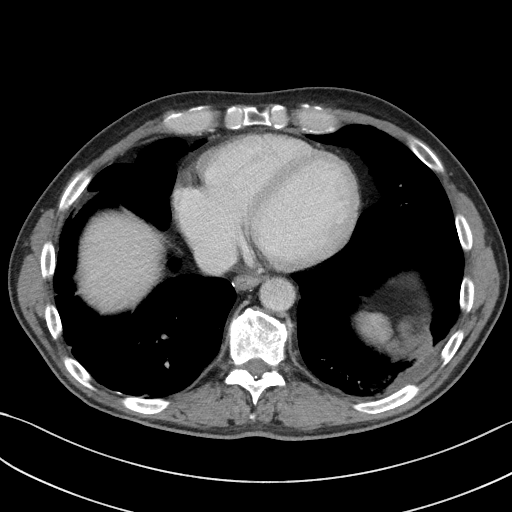
[im 153/230  lung]
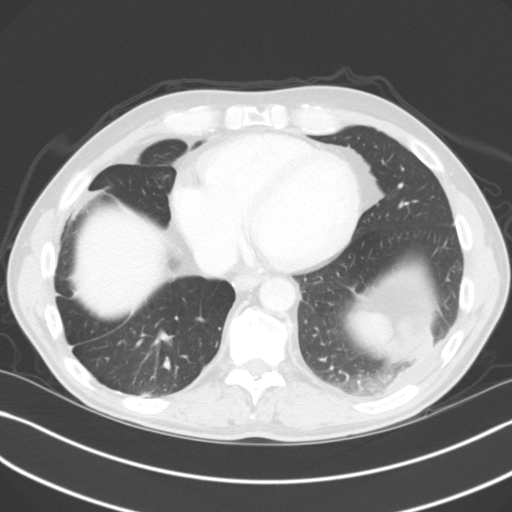
[im 179/230  soft-tissue]
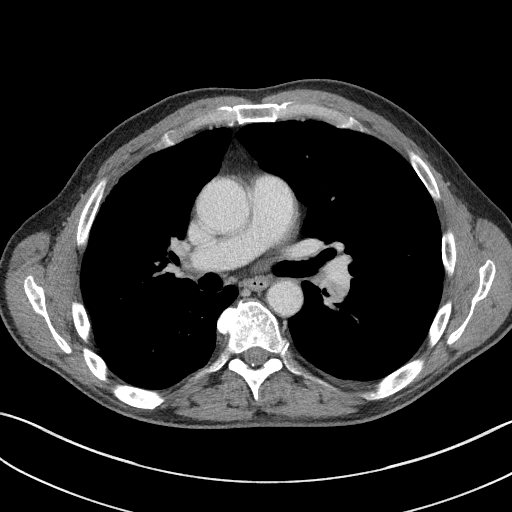
[im 179/230  lung]
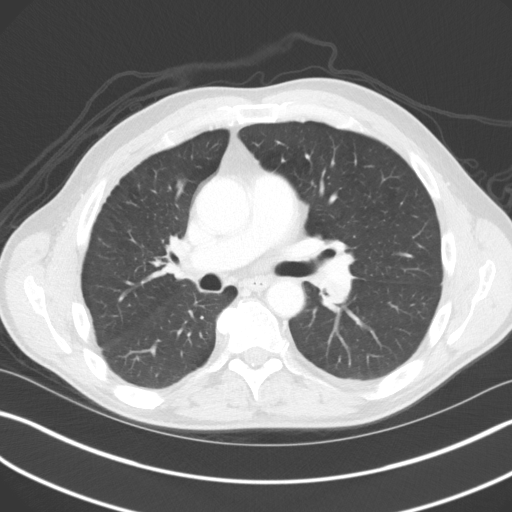
[im 204/230  soft-tissue]
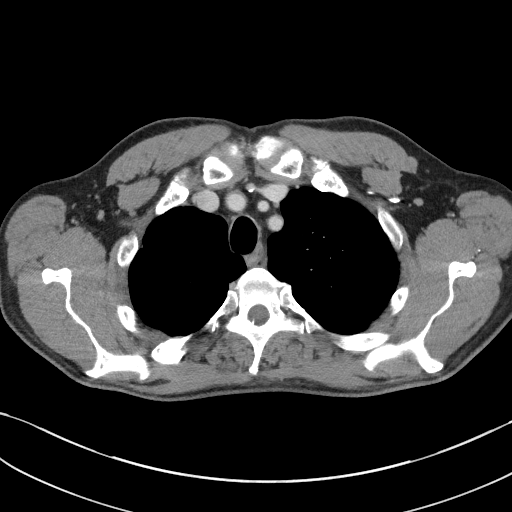
[im 204/230  lung]
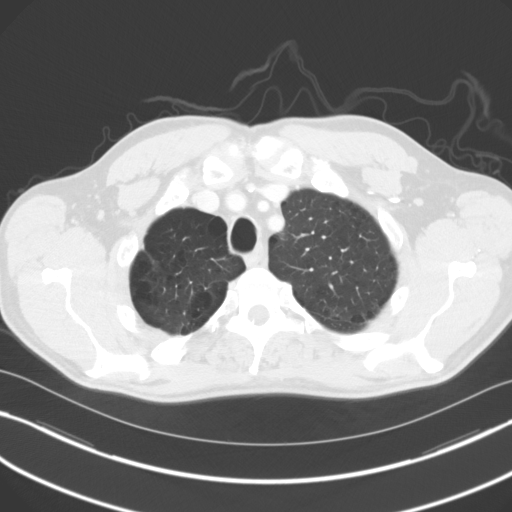

[10 of 46 positions shown; findings below may reference images not displayed]

FINDINGS: CT CHEST FINDINGS

Cardiovascular: Aortic and branch vessel atherosclerosis. Normal
heart size, without pericardial effusion. No central pulmonary
embolism, on this non-dedicated study.

Mediastinum/Nodes: No supraclavicular adenopathy. No mediastinal or
hilar adenopathy.

Lungs/Pleura: Right upper lobe presumed treated metastasis,
including 3.4 x 1.5 cm on image 56/series 15. Compare 3.5 x 1.6 cm
on the prior.

Central right middle lobe pulmonary nodule measures 2.4 x 1.8 cm on
image 95/series 15. Compare 2.3 x 2.1 cm on the prior.

Right lower lobe surgical sutures. Pleural-based left lower lobe
nodular density measures 2.3 x 1.0 cm on image 114/series 15.
Compare 2.3 x 1.3 cm on the prior.

Lateral left lower lobe scarring.

Musculoskeletal: Dorsal spinal stimulator. Heterogeneous expansile
lesion involving the posterior right fourth rib, including on image
27/series 2, similar.

CT ABDOMEN AND PELVIS FINDINGS

Hepatobiliary: Normal liver. Normal gallbladder, without biliary
ductal dilatation.

Pancreas: Again identified is moderate pancreatic duct dilatation
within the body and tail. Subtle enhancing nodularity along the duct
within the body is similar on image 35/series 6. Hyperenhancing
lesion within the pancreatic neck measures 1.7 x 1.8 cm on image
38/series 6 and is similar 1.8 x 1.8 cm on the prior. An adjacent
primarily exophytic pancreatic head lesion ventrally measures 3.2 x
2.8 cm on image 49/series 6. This is enlarged from 2.7 x 2.4 cm on
the prior.

No acute pancreatitis.

Spleen: Normal in size, without focal abnormality.

Adrenals/Urinary Tract: Status post left nephrectomy, without
locally recurrent disease. Normal right adrenal gland and kidney.
Normal urinary bladder.

Stomach/Bowel: Normal stomach, without wall thickening. Normal colon
and terminal ileum. Normal small bowel.

Vascular/Lymphatic: Aortic and branch vessel atherosclerosis. No
abdominopelvic adenopathy.

Reproductive: Normal prostate.

Other: Trace fluid within the right hemipelvis, including on image
186/series 11. This is new, but of indeterminate etiology.

Musculoskeletal: Nonspecific edema about the right pelvic wall
subcutaneous fat anteriorly. This is similar on image 162/series 11.
Right inferior pubic ramus lytic lesion is similar. Incompletely
imaged right proximal femoral lesion with cortical involvement.
Example at 3.3 cm on image 227/series 11. This is unchanged.

Relatively ill-defined right sacral lytic lesion, including at
cm.
IMPRESSION: CT CHEST IMPRESSION

1. Similar to slight improvement in pulmonary lesions, most
consistent with treated metastasis.
2. No thoracic adenopathy or progressive disease identified in the
chest.
3. Aortic atherosclerosis (K8XQI-9SO.O) and emphysema (K8XQI-BZN.3).
4. Trace left-sided pleural fluid, similar.

CT ABDOMEN AND PELVIS IMPRESSION

1. Pancreatic metastasis. The dominant lesion has enlarged
minimally. The smaller lesion, within the neck, is not significantly
changed.
2. No new sites of disease identified.
3. Similar osseous metastasis.
4. New trace right hemi pelvic fluid, nonspecific.

## 2019-11-25 DIAGNOSIS — G620 Drug-induced polyneuropathy: Secondary | ICD-10-CM | POA: Diagnosis not present

## 2019-11-26 ENCOUNTER — Other Ambulatory Visit: Payer: Self-pay | Admitting: Oncology

## 2019-12-01 ENCOUNTER — Other Ambulatory Visit: Payer: Self-pay

## 2019-12-01 DIAGNOSIS — C7800 Secondary malignant neoplasm of unspecified lung: Secondary | ICD-10-CM

## 2019-12-01 DIAGNOSIS — C649 Malignant neoplasm of unspecified kidney, except renal pelvis: Secondary | ICD-10-CM

## 2019-12-01 DIAGNOSIS — K8689 Other specified diseases of pancreas: Secondary | ICD-10-CM

## 2019-12-01 DIAGNOSIS — C7951 Secondary malignant neoplasm of bone: Secondary | ICD-10-CM

## 2019-12-01 MED ORDER — CABOMETYX 60 MG PO TABS: 60.0000 mg | ORAL_TABLET | Freq: Every day | ORAL | 0 refills | Status: DC

## 2019-12-02 ENCOUNTER — Telehealth: Payer: Self-pay | Admitting: Oncology

## 2019-12-02 NOTE — Telephone Encounter (Signed)
Called patient regarding providers request to convert 12/31 follow-up to a virtual visit, patient is notified.

## 2019-12-09 IMAGING — CT CT ANGIO AOBIFEM WO/W CM
1 of 11 series · 7 of 33 positions shown · IV contrast (APPLIED)
Comparison: CT of the chest, abdomen and pelvis - 01/20/2018; right
femur MRI - 01/20/2018

CLINICAL DATA: History of metastatic renal cell carcinoma, now with
lesions involving the right femur. Please perform CT angiogram to
evaluate for arterial supply prior to bland embolization prior to
definitive intramedullary rod pinning.

EXAM:
CT ANGIOGRAPHY OF ABDOMINAL AORTA WITH ILIOFEMORAL RUNOFF
TECHNIQUE: Multidetector CT imaging of the abdomen, pelvis and lower
extremities was performed using the standard protocol during bolus
administration of intravenous contrast. Multiplanar CT image
reconstructions and MIPs were obtained to evaluate the vascular
anatomy.
CONTRAST:  100mL OHPD87-3P6 IOPAMIDOL (OHPD87-3P6) INJECTION 76%

[Series 11: axial arterial upper · axial · arterial · 0.98mm/px · z∈[-1354,-526]mm · 7 of 368 slices shown]
[im 46/368  soft-tissue]
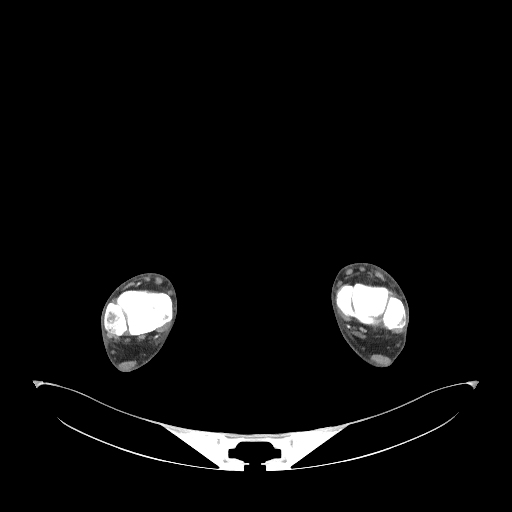
[im 92/368  bone]
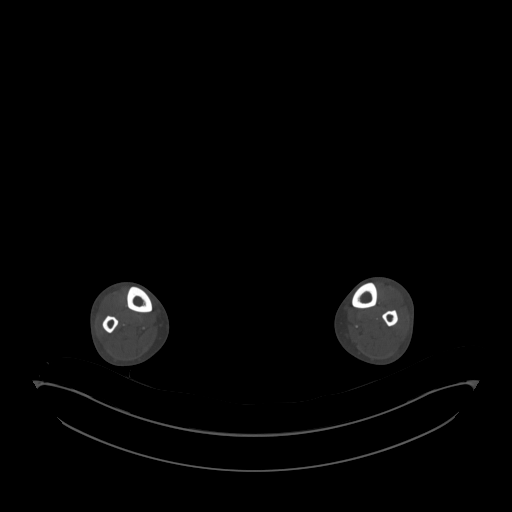
[im 138/368  soft-tissue]
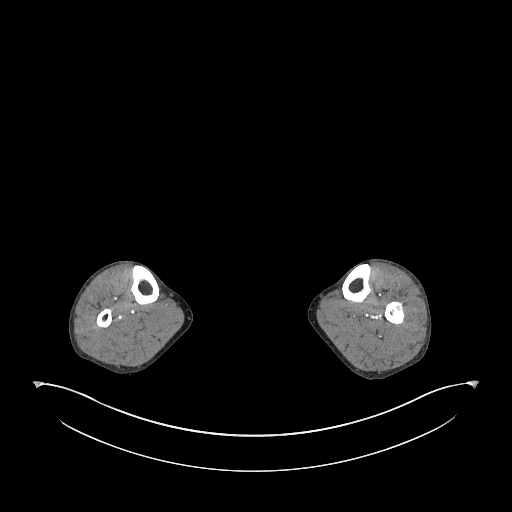
[im 184/368  bone]
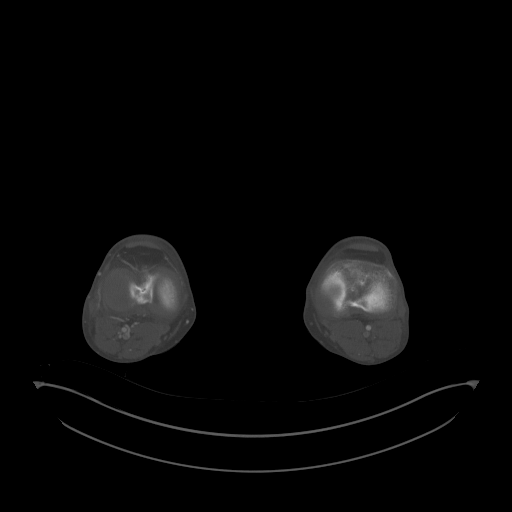
[im 230/368  soft-tissue]
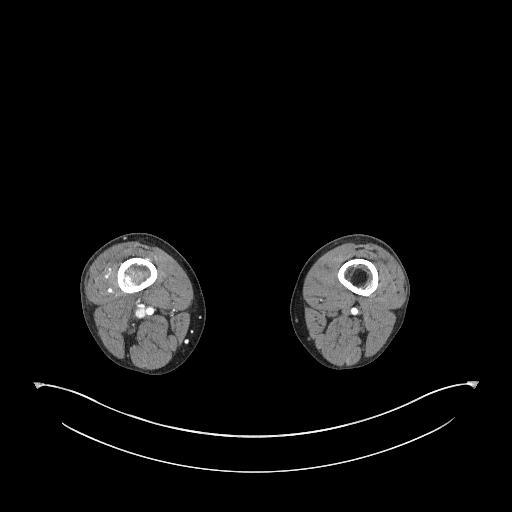
[im 276/368  bone]
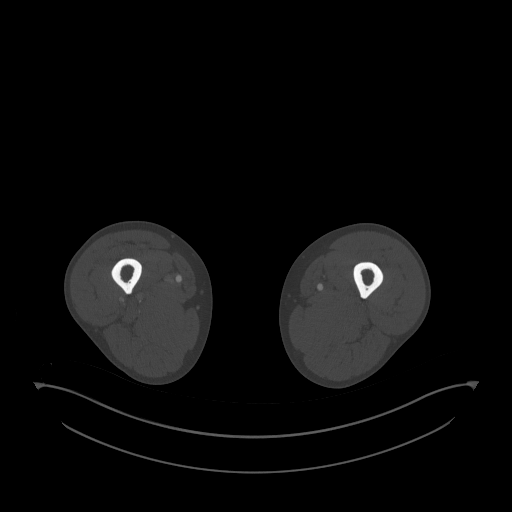
[im 322/368  soft-tissue]
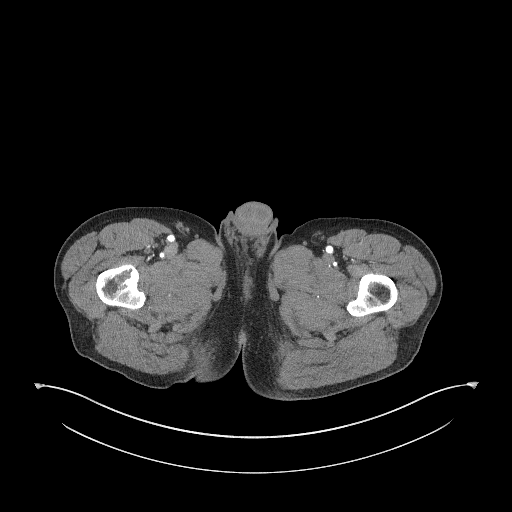

[7 of 33 positions shown; findings below may reference images not displayed]

FINDINGS: VASCULAR

Aorta: Very minimal amount of mixed calcified though largely
noncalcified atherosclerotic plaque with a normal caliber abdominal
aorta, not resulting in hemodynamically significant stenosis. No
abdominal aortic dissection or periaortic stranding.

Celiac: Widely patent without hemodynamically significant narrowing.
Conventional branching pattern.

SMA: Widely patent without hemodynamically significant narrowing.
Conventional branching pattern. The distal tributaries the SMA
appear widely patent without discrete intraluminal filling defect to
suggest distal embolism.

Renals: The left renal artery is surgically absent. The right renal
artery is widely patent without hemodynamically significant
narrowing. No vessel irregularity to suggest FMD. Note is made of
mild ectasia of the distal aspect of the right renal artery at the
level of its bifurcation measuring approximately 0.9 cm (image 66,
series 5).

IMA: Widely patent without a hemodynamically significant narrowing.

RIGHT Lower Extremity

Inflow: There is a minimal amount of calcified atherosclerotic
plaque within the right common iliac artery, not resulting in a
hemodynamically significant stenosis. Note is made of an
approximately 0.7 x 0.4 cm penetrating atherosclerotic ulcer in
arising from the medial aspect of the right common iliac artery
(image 99, series 4) no perivascular stranding. The right internal
and external iliac arteries are of normal caliber and widely patent
without hemodynamically significant narrowing.

Outflow: The right common, deep and superficial femoral arteries are
widely patent without hemodynamically significant narrowing. The
right above and below-knee popliteal arteries are widely patent
without hemodynamically significant narrowing.

The lesion within the proximal aspect of the right femur appears to
be supplied from a tiny branch vessel which arises directly from the
origin of the right superficial femoral artery (image 34, series
11), while the lesion within the distal aspect the right femur
appears to be supplied by 2 adjacent hypertrophied suprageniculate
arterial collaterals (axial image 141, series 11, coronal image 159,
series 12 and axial image 170, series 11; coronal image 64, series
12).

The dominant lesion within right tibial plateau appears to be
supplied via an infrageniculate arterial collateral (axial image
180, series 11).

Runoff: Three-vessel runoff to the right lower leg and foot.
Right-sided dorsalis pedis artery is patent to the level of the
forefoot.

LEFT Lower Extremity

Inflow: The left common and external iliac arteries are widely
patent without hemodynamically significant narrowing. Minimal amount
of atherosclerotic plaque within a normal caliber left internal
iliac artery

Outflow: The left common, deep and superficial femoral arteries
appear widely patent without hemodynamically significant narrowing.
The left above and below-knee popliteal artery is widely patent
without hemodynamically significant narrowing.

Runoff: 3 vessel runoff to the left lower leg and foot, however the
left anterior tibial artery is noted to occlude at the level of the
left ankle mortise. A left-sided dorsalis pedis artery is not
identified.

Veins: The pelvic venous system and IVC appear widely patent on this
arterial phase examination.

Review of the MIP images confirms the above findings.

NON-VASCULAR

Lower chest: Limited visualization of lower thorax demonstrates
grossly unchanged appearance of the slightly spiculated
approximately 2.2 x 1.6 cm nodular opacity within the right middle
lobe (image 1, series 10) as well as the less well-defined
approximately 2.4 x 1.0 cm subpleural nodular opacity within the
imaged left lower lobe (image 13, series 10). Stable postsurgical
change of the image right lower lobe.

Normal heart size.  No pericardial effusion.

Hepatobiliary: Normal hepatic contour. No discrete hepatic lesions.
Normal appearance of the gallbladder given degree distention. No
radiopaque gallstones. No intra extrahepatic bili duct dilatation.
No ascites.

Pancreas: Known enhancing pancreatic metastases are grossly
unchanged with index lesion within mid body of the pancreas
measuring approximately 2.5 x 1.7 cm (image 40, series 4), with
associated downstream pancreatic ductal dilatation and atrophy.
Exophytic lesion arising from the pancreatic head/neck junction is
also unchanged measuring approximately 3.1 x 2.2 cm (image 49,
series 4). No peripancreatic stranding.

Spleen: Normal appearance of the spleen

Adrenals/Urinary Tract: Stable sequela of prior left nephrectomy
without evidence of locally recurrent disease.

There is homogeneous enhancement of the solitary remaining right
kidney. No discrete right-sided sided renal lesions. No definite
renal stones this postcontrast examination. No urine obstruction or
perinephric stranding.

Normal appearance the bilateral adrenal glands. Normal appearance of
the urinary bladder given degree distention.

Stomach/Bowel: Moderate colonic stool burden without evidence of
enteric obstruction. The bowel is normal in course and caliber
without wall thickening. No pneumoperitoneum, pneumatosis or portal
venous gas.

Lymphatic: No bulky retroperitoneal, mesenteric, pelvic or inguinal
lymphadenopathy.

Reproductive: Normal appearance the prostate gland. No free fluid in
the pelvic cul-de-sac.

Other: Spinal stimulator device is imbedded within the soft tissues
of the ventral aspect of the left lower abdomen.

Musculoskeletal: Infiltrative lytic lesion involving the right-side
of the sacrum is grossly unchanged measuring approximately 4.0 x
cm (115, series 4). Grossly unchanged appearance of the
approximately 2.1 x 1.1 cm lytic lesion involving the right inferior
pubic ramus (156, series 4).

Lytic lesion involving the proximal diaphysis of the right femur is
grossly unchanged measuring approximately 2.7 x 3.0 x 3.9 cm (image
42, series 11, coronal image 59, series 5).

Lytic, expansile lesion involving the distal aspect of the right
femoral diaphysis is grossly unchanged measuring approximately 4.3 x
2.5 x 5.6 cm (axial image 153, series 11; coronal image 48, series
12).

Not seen on the right femoral MRI performed 01/20/2018 is an
approximately 6.2 x 4.2 x 5.7 cm lytic lesion involving the proximal
aspect of the right tibia (image 195, series 11; coronal image 52,
series 12). This lesion appears to abut the subarticular surface of
the tibial plateau as well as the base of the tibial spines
(representative image 50, series 12).

Note is made of an approximately 3.0 x 2.1 x 4.8 cm lytic expansile
lesion involving the left fibula (axial image 240, series 11,
coronal image 60, series 12).
IMPRESSION: VASCULAR

1. Very minimal amount of atherosclerotic plaque within a normal
caliber abdominal aorta, not resulting in a hemodynamically
significant stenosis. Aortic Atherosclerosis ([id number]).
2. Arterial supply involving the right femoral and tibial lesions as
detailed above.

NON-VASCULAR

1. Stable sequela of prior left-sided nephrectomy without evidence
of local recurrence.
2. Osseous metastatic disease involving the sacrum, right inferior
pubic ramus, right femur and tibia and the left fibula as detailed
above. Note, the lesion involving the proximal aspect the right
tibia appears to extend to involve the subarticular surface of the
tibial plateau.
3. Grossly unchanged appearance of known metastatic disease
involving the imaged bilateral lung bases as well as the pancreas as
detailed above, similar to the 01/20/2018 examination.

## 2019-12-13 ENCOUNTER — Other Ambulatory Visit: Payer: Self-pay | Admitting: Oncology

## 2019-12-13 MED ORDER — ALPRAZOLAM 1 MG PO TABS: 1.0000 mg | ORAL_TABLET | Freq: Three times a day (TID) | ORAL | 0 refills | Status: DC | PRN

## 2019-12-14 DIAGNOSIS — K2970 Gastritis, unspecified, without bleeding: Secondary | ICD-10-CM | POA: Diagnosis not present

## 2019-12-14 DIAGNOSIS — B9681 Helicobacter pylori [H. pylori] as the cause of diseases classified elsewhere: Secondary | ICD-10-CM | POA: Diagnosis not present

## 2019-12-15 ENCOUNTER — Other Ambulatory Visit: Payer: Self-pay

## 2019-12-15 ENCOUNTER — Ambulatory Visit: Payer: Medicare Other | Attending: Internal Medicine

## 2019-12-15 DIAGNOSIS — Z20822 Contact with and (suspected) exposure to covid-19: Secondary | ICD-10-CM

## 2019-12-16 LAB — NOVEL CORONAVIRUS, NAA: SARS-CoV-2, NAA: NOT DETECTED

## 2019-12-20 ENCOUNTER — Other Ambulatory Visit: Payer: Self-pay

## 2019-12-20 ENCOUNTER — Encounter (HOSPITAL_COMMUNITY): Payer: Self-pay

## 2019-12-20 ENCOUNTER — Ambulatory Visit (HOSPITAL_COMMUNITY)
Admission: RE | Admit: 2019-12-20 | Discharge: 2019-12-20 | Disposition: A | Discharge status: Home / Self Care | Payer: Medicare Other | Source: Ambulatory Visit | Attending: Oncology | Admitting: Oncology

## 2019-12-20 ENCOUNTER — Inpatient Hospital Stay: Payer: Medicare Other | Attending: Oncology

## 2019-12-20 DIAGNOSIS — C649 Malignant neoplasm of unspecified kidney, except renal pelvis: Secondary | ICD-10-CM | POA: Insufficient documentation

## 2019-12-20 DIAGNOSIS — I700 Atherosclerosis of aorta: Secondary | ICD-10-CM | POA: Insufficient documentation

## 2019-12-20 DIAGNOSIS — R590 Localized enlarged lymph nodes: Secondary | ICD-10-CM | POA: Insufficient documentation

## 2019-12-20 DIAGNOSIS — C7951 Secondary malignant neoplasm of bone: Secondary | ICD-10-CM | POA: Diagnosis not present

## 2019-12-20 DIAGNOSIS — R911 Solitary pulmonary nodule: Secondary | ICD-10-CM | POA: Insufficient documentation

## 2019-12-20 DIAGNOSIS — C641 Malignant neoplasm of right kidney, except renal pelvis: Secondary | ICD-10-CM | POA: Diagnosis not present

## 2019-12-20 LAB — CMP (CANCER CENTER ONLY)
ALT: 23 U/L (ref 0–44)
AST: 20 U/L (ref 15–41)
Albumin: 3.1 g/dL — ABNORMAL LOW (ref 3.5–5.0)
Alkaline Phosphatase: 110 U/L (ref 38–126)
Anion gap: 11 (ref 5–15)
BUN: 15 mg/dL (ref 6–20)
CO2: 22 mmol/L (ref 22–32)
Calcium: 8.1 mg/dL — ABNORMAL LOW (ref 8.9–10.3)
Chloride: 106 mmol/L (ref 98–111)
Creatinine: 1.35 mg/dL — ABNORMAL HIGH (ref 0.61–1.24)
GFR, Est AFR Am: >60 mL/min (ref >60)
GFR, Estimated: 60 mL/min — ABNORMAL LOW (ref >60)
Glucose, Bld: 96 mg/dL (ref 70–99)
Potassium: 4.3 mmol/L (ref 3.5–5.1)
Sodium: 139 mmol/L (ref 135–145)
Total Bilirubin: 0.2 mg/dL — ABNORMAL LOW (ref 0.3–1.2)
Total Protein: 7.4 g/dL (ref 6.5–8.1)

## 2019-12-20 LAB — CBC WITH DIFFERENTIAL (CANCER CENTER ONLY)
Abs Immature Granulocytes: 0.01 10*3/uL (ref 0.00–0.07)
Basophils Absolute: 0.1 10*3/uL (ref 0.0–0.1)
Basophils Relative: 1 %
Eosinophils Absolute: 0.1 10*3/uL (ref 0.0–0.5)
Eosinophils Relative: 3 %
HCT: 33.1 % — ABNORMAL LOW (ref 39.0–52.0)
Hemoglobin: 9.5 g/dL — ABNORMAL LOW (ref 13.0–17.0)
Immature Granulocytes: 0 %
Lymphocytes Relative: 22 %
Lymphs Abs: 1.1 10*3/uL (ref 0.7–4.0)
MCH: 29 pg (ref 26.0–34.0)
MCHC: 28.7 g/dL — ABNORMAL LOW (ref 30.0–36.0)
MCV: 100.9 fL — ABNORMAL HIGH (ref 80.0–100.0)
Monocytes Absolute: 0.4 10*3/uL (ref 0.1–1.0)
Monocytes Relative: 7 %
Neutro Abs: 3.4 10*3/uL (ref 1.7–7.7)
Neutrophils Relative %: 67 %
Platelet Count: 121 10*3/uL — ABNORMAL LOW (ref 150–400)
RBC: 3.28 MIL/uL — ABNORMAL LOW (ref 4.22–5.81)
RDW: 19.9 % — ABNORMAL HIGH (ref 11.5–15.5)
WBC Count: 5 10*3/uL (ref 4.0–10.5)
nRBC: 0 % (ref 0.0–0.2)

## 2019-12-20 IMAGING — US IR ANGIO/EXT/UNI*R*
1 series · 1 of 1 positions shown · non-contrast
Comparison: CTA run off - 02/04/2017

INDICATION: History of metastatic renal cell carcinoma, now with lytic expansile
lesions within proximal and distal aspects of the right femur.
Please perform percutaneous embolization prior to intramedullary rod
fixation.

[Series 1: ir (id) (id)/(id)/(id) · 1 of 1 slices shown]
[im 1/1]
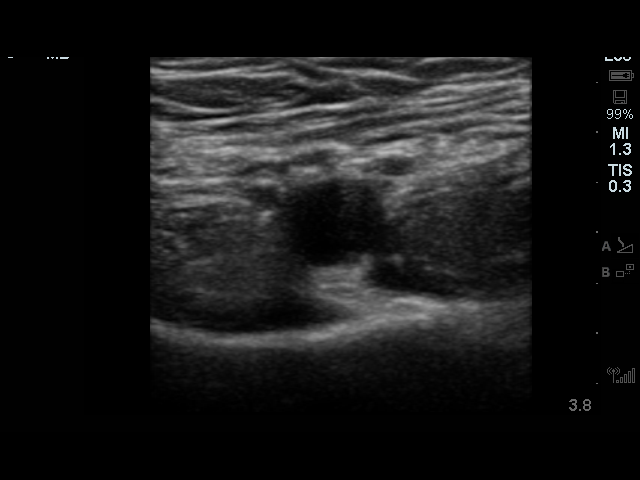

[1 of 1 positions shown; findings below may reference images not displayed]

EXAM:
1. ULTRASOUND GUIDANCE FOR ARTERIAL ACCESS
2. PELVIC ARTERIOGRAM
3. RIGHT LOWER EXTREMITY ARTERIOGRAM
4. SELECTIVE RIGHT DEEP FEMORAL ARTERIOGRAM
5. SELECTIVE RIGHT DEEP FEMORAL ARTERIOGRAM AND PERCUTANEOUS
EMBOLIZATION.
6. SELECTIVE RIGHT SUPRA GENICULATE ARTERIOGRAM AND PERCUTANEOUS
PARTICLE EMBOLIZATION.
MEDICATIONS:
Vancomycin 1 gm IV. The antibiotic was administered within 1 hour of
the procedure

ANESTHESIA/SEDATION:
Moderate (conscious) sedation was employed during this procedure. A
total of Versed 8 mg and Fentanyl 400 mcg was administered
intravenously.

Moderate Sedation Time: 92 minutes. The patient's level of
consciousness and vital signs were monitored continuously by
radiology nursing throughout the procedure under my direct
supervision.

CONTRAST:  115 cc Omnipaque 300

FLUOROSCOPY TIME:  19 minutes 36 seconds (967 mGy).

COMPLICATIONS:
None immediate.



The left femoral head was marked fluoroscopically. Under ultrasound
guidance, the left common femoral artery was accessed with a
micropuncture kit after the overlying soft tissues were anesthetized
with 1% lidocaine. An ultrasound image was saved for documentation
purposes. The micropuncture sheath was exchanged for a 5 French
vascular sheath over a Bentson wire. A closure arteriogram was
performed through the side of the sheath confirming access within
the right common femoral artery.

Over a Bentson wire, a Omni Flush catheter was advanced to the level
of the inferior aspect of the abdominal aorta, back bled and
flushed. Pelvic arteriogram was performed

Next, with the use of a stiff Glidewire, the Omni Flush catheter was
advanced into the contralateral right common femoral artery and a
diagnostic right lower extremity arteriogram was performed at
multiple stations to the level of the right foot.

Over a stiff Glidewire, both the Omni Flush catheter and the 5
French vascular sheath were exchanged for a 55 cm radiopaque tip 5
French vascular sheath which was advanced from the level of the left
common femoral artery access site to the contralateral right common
femoral artery.

With the use of a regular glidewire, a 4 French angled glide
catheter was utilized to select several branches of the right deep
femoral artery, ultimately, the ascending branch of the lateral
femoral circumflex artery was selected with contrast injection
demonstrating supply to the ill-defined hypervascular lesion within
the proximal diaphysis of the femur.

With the use of a fathom 14 microwire, a high-flow Renegade
microcatheter was advanced into the proximal aspect of the ascending
branch of the lateral femoral circumflex artery with contrast
injection confirming appropriate positioning. Next, the vessel was
embolized to near stasis with approximately [DATE] of a vial of
300-500 micron Embospheres.

The microcatheter was advanced centrally and post embolization
arteriogram was performed.

Microcatheter was removed and the 4 French angled glide catheter was
utilized to select the descending genicular artery. Contrast
injection demonstrated apparent solitary supply to the known lytic
expansile lesion involving the distal diaphysis of the femur. Again,
a fathom 14 microwire was utilized to advance a high-flow Renegade
microcatheter into a tiny branch vessel supplying the hypervascular
lesion. Contrast injection confirmed appropriate positioning and
hypervascular lesion was embolized with approximately [DATE] of a vial
of 300-500 micron Embospheres.

The microcatheter was retracted into the origin of the vessel and
post embolization arteriogram was performed.

At this point, all wires, catheters and sheaths were removed from
the patient. Superficial hemostasis was achieved at the left groin
access site with manual compression. A dressing was placed. The
patient tolerated the procedure well without immediate
postprocedural complication.
FINDINGS: Pelvic arteriogram demonstrates wide patency of the bilateral
common, external and internal iliac arteries. Redemonstrated focal
ectasia of the bilateral common iliac arteries.

Right lower extremity arteriogram demonstrates wide patency of the
right common and superficial femoral arteries as well

wide patency of the right above and below-knee popliteal arteries.

Ill-defined hypervascular lesions are seen within the proximal and
distal aspects of the right femur as well as the proximal tibia as
demonstrated on preceding CTA run-off.

There is a three-vessel runoff to the right lower leg and foot
common however note is made of sluggish flow to the right foot
favored to be secondary to steal phenomenon due to the hypervascular
lesions within the femur and proximal tibia.

Sub selective arteriogram of the ascending branch of lateral femoral
circumflex artery demonstrated apparent solitary supply to the
ill-defined hypervascular lesion involving the proximal femoral
diaphysis. This vessel was catheterized and successfully particle
embolized. Completion arteriogram demonstrates no persistent
enhancement of the ill-defined proximal femoral lesion.

Sub selective arteriogram of the descending genicular artery
demonstrates apparent solitary supply to the ill-defined
hypervascular lesion within the distal femoral diaphysis. This
vessel was catheterized and successfully particle embolized.
Completion arteriogram demonstrates no persistent enhancement of the
ill-defined distal femoral lesion.
IMPRESSION: 1. Technically successful particle embolization of both the proximal
and distal femoral hypervascular renal cell metastasis.
2. Wide patency of the right lower extremity arterial system to the
level of the right foot, however note is made of sluggish flow to
the right foot favored to be secondary to steal phenomenon from the
hypervascular lesions within the right femur and proximal tibia.

PLAN:
- Patient will be admitted overnight for continued observation and
PCA usage.

- Patient to undergo definitive intramedullary femoral rod pinning
tomorrow with Dr. Mhaye..

- Note, the proximal tibial lesion is amenable to particle
embolization if tibial fixation is pursued in the future.

## 2019-12-20 MED ORDER — IOHEXOL 300 MG/ML  SOLN
100.0000 mL | Freq: Once | INTRAMUSCULAR | Status: AC | PRN
  Administered 2019-12-20: 100 mL via INTRAVENOUS

## 2019-12-20 MED ORDER — SODIUM CHLORIDE (PF) 0.9 % IJ SOLN
INTRAMUSCULAR | Status: AC
  Filled 2019-12-20: qty 50

## 2019-12-21 ENCOUNTER — Telehealth: Payer: Self-pay | Admitting: Oncology

## 2019-12-21 ENCOUNTER — Telehealth: Payer: Self-pay

## 2019-12-21 DIAGNOSIS — I10 Essential (primary) hypertension: Secondary | ICD-10-CM | POA: Diagnosis not present

## 2019-12-21 DIAGNOSIS — C642 Malignant neoplasm of left kidney, except renal pelvis: Secondary | ICD-10-CM | POA: Diagnosis not present

## 2019-12-21 DIAGNOSIS — Z9689 Presence of other specified functional implants: Secondary | ICD-10-CM | POA: Diagnosis not present

## 2019-12-21 DIAGNOSIS — G620 Drug-induced polyneuropathy: Secondary | ICD-10-CM | POA: Diagnosis not present

## 2019-12-21 IMAGING — DX DG PORTABLE PELVIS
1 series · 1 of 1 positions shown · non-contrast
Comparison: 02/16/2018, 02/04/2018

CLINICAL DATA: Postop

EXAM:
PORTABLE PELVIS 1-2 VIEWS

[pelvis ap]
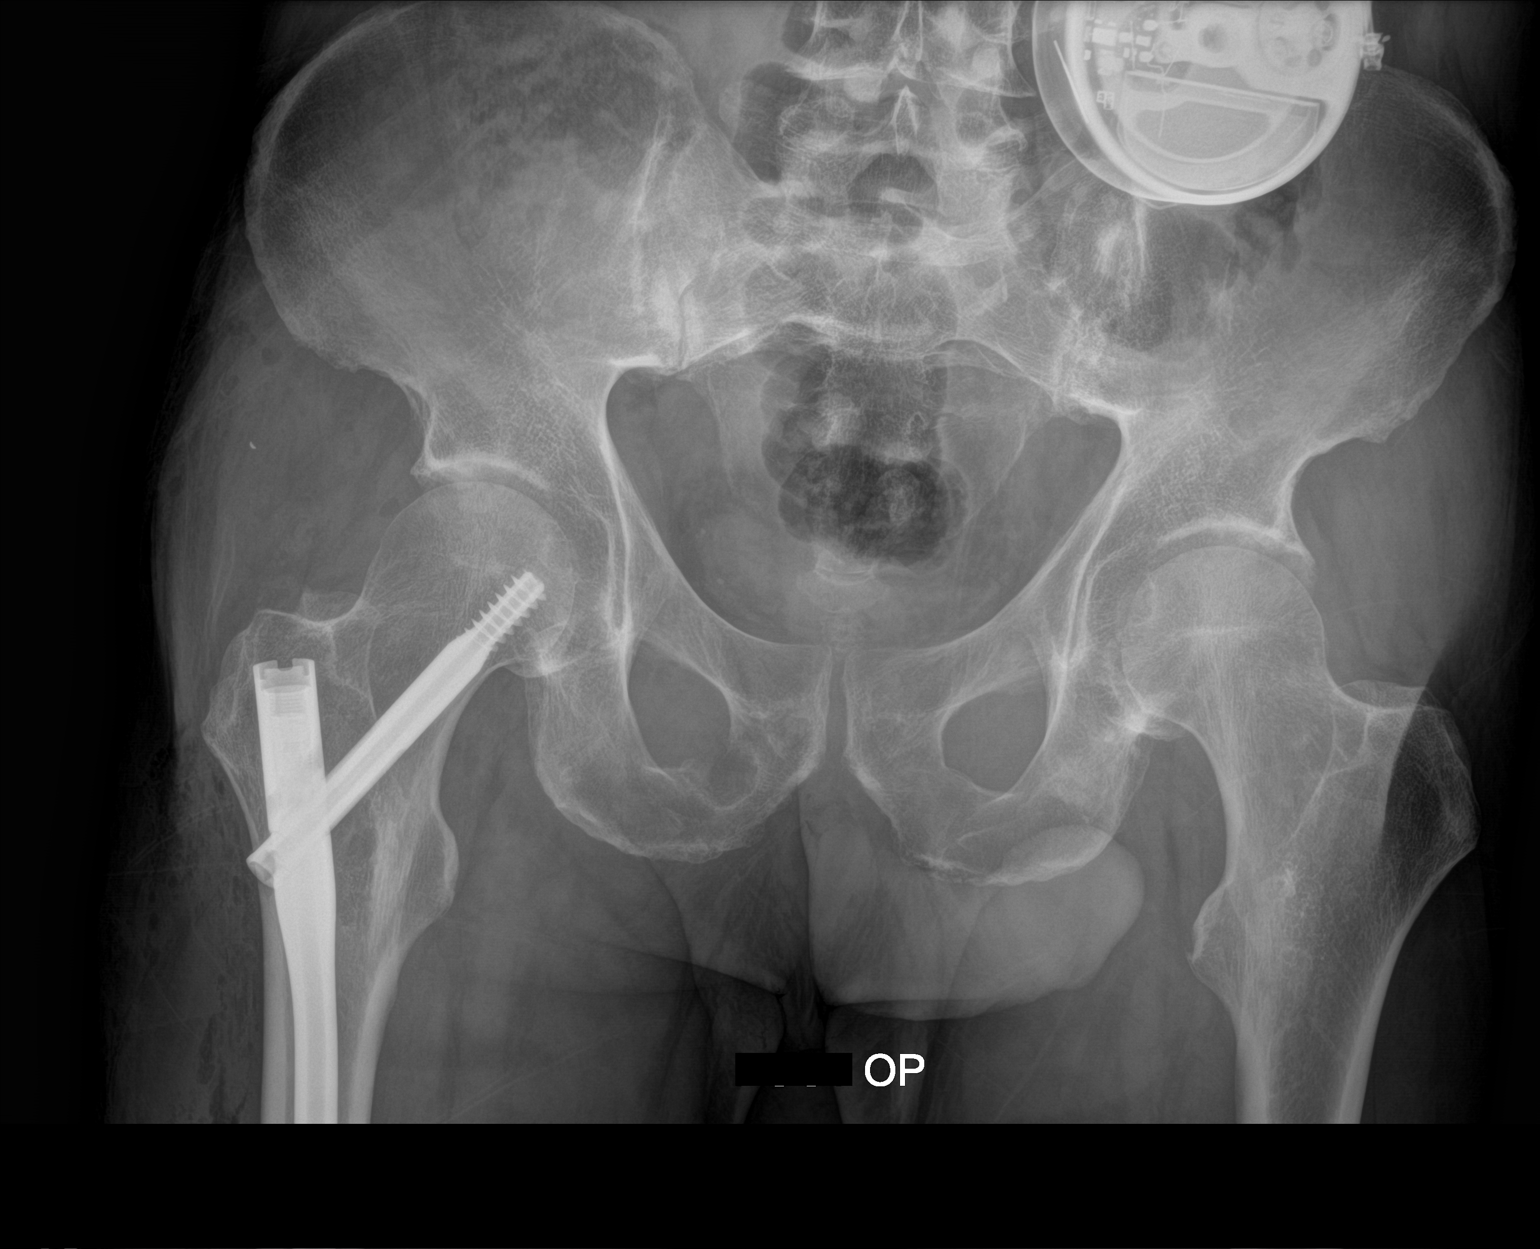

[1 of 1 positions shown; findings below may reference images not displayed]

FINDINGS: Pubic symphysis is intact. Lytic lesion in the inferior pubic ramus
on the right. Status post intramedullary rodding of the proximal
right femur with normal alignment. Gas in the soft tissues of the
lateral thigh. Metallic device over the left lower quadrant. Lytic
lesion in the proximal femur is partially obscured by the
intramedullary rod.
IMPRESSION: 1. Status post intramedullary rodding of the right femur with normal
alignment. Expected postoperative gas in the lateral hip and
proximal thigh
2. Lytic lesion in the right inferior ramus and proximal right femur
corresponding to skeletal metastases noted on prior imaging.

## 2019-12-21 NOTE — Telephone Encounter (Signed)
Faxed pt's COVID results to [facility] for an appt. Today; faxed to [phone].

## 2019-12-21 NOTE — Telephone Encounter (Signed)
Helene Kelp from [facility] called to get test results faxed over for pt because he has an appointment today. Sent request to nurse triage line.   [place]

## 2019-12-21 NOTE — Telephone Encounter (Signed)
Contacted patient to verify mychart visit for pre reg 

## 2019-12-22 ENCOUNTER — Inpatient Hospital Stay (HOSPITAL_BASED_OUTPATIENT_CLINIC_OR_DEPARTMENT_OTHER): Payer: Medicare Other | Admitting: Oncology

## 2019-12-22 DIAGNOSIS — C649 Malignant neoplasm of unspecified kidney, except renal pelvis: Secondary | ICD-10-CM | POA: Diagnosis not present

## 2019-12-22 NOTE — Progress Notes (Signed)
Hematology and Oncology Follow Up for Telemedicine Visits  Keith [name] [id number] Aug 11, 1966 53 y.o. 12/22/2019 9:29 AM [name], MDPharr, [name], MD   I connected with Keith [name] on 12/22/19 at 10:00 AM EST by video enabled telemedicine visit and verified that I am speaking with the correct person using two identifiers.   I discussed the limitations, risks, security and privacy concerns of performing an evaluation and management service by telemedicine and the availability of in-person appointments. I also discussed with the patient that there may be a patient responsible charge related to this service. The patient expressed understanding and agreed to proceed.  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    Current therapy: Cabometyx 60 mg daily started in November 2016.   Interim History: Keith [name] reports feeling reasonably well without any major complaints.  He does report some occasional epigastric abdominal pain and currently under evaluation by GI.  Scheduled to have a colonoscopy and endoscopy.  He denies any hematochezia, melena or hemoptysis.  He has not reported any shortness of breath or difficulty breathing.  Performance status and quality of life remains unchanged.  Denies any worsening diarrhea or complications related to cabozantinib.     Medications: I have reviewed the patient's current medications.  Current Outpatient Medications  Medication Sig Dispense Refill  . ALPRAZolam (XANAX) 1 MG tablet Take 1 tablet (1 mg total) by mouth 3 (three) times daily as needed for anxiety. 90 tablet 0  . cabozantinib (CABOMETYX) 60 MG tablet Take 1 tablet (60 mg total) by mouth daily. With at least 8 ounces of water on an empty stomach. Do not eat for 2  hours before or 1 hour after. Swallow whole. 30 tablet 0  . calcium carbonate (TUMS - DOSED IN MG ELEMENTAL CALCIUM) 500 MG chewable tablet Chew 1 tablet by mouth 3 (three) times daily as needed for indigestion or heartburn.     . doxycycline (VIBRAMYCIN) 100 MG capsule Take 1 tablet (100 mg) daily at dinner for 10 days. 10 capsule 0  . DULoxetine (CYMBALTA) 60 MG capsule TAKE 1 CAPSULE (60 MG TOTAL) BY MOUTH 2 (TWO) TIMES DAILY. 180 capsule 0  . ferrous sulfate 325 (65 FE) MG tablet Take 1 tablet (325 mg total) by mouth daily with breakfast. 60 tablet 0  . gabapentin (NEURONTIN) 300 MG capsule TAKE 1 CAPSULE BY MOUTH THREE TIMES A DAY 270 capsule 0  . HYDROmorphone (DILAUDID) 8 MG tablet Take 1 tablet (8 mg total) by mouth every 4 (four) hours as needed for severe pain. 30 tablet 0  . hydroxypropyl methylcellulose / hypromellose (ISOPTO TEARS / GONIOVISC) 2.5 % ophthalmic solution Place 1 drop into both eyes 3 (three) times daily as needed for dry eyes.    [name] loratadine (CLARITIN) 10 MG tablet Take 10 mg by mouth daily.    . megestrol (MEGACE) 40 MG/ML suspension TAKE 10 MLS (400 MG TOTAL) BY MOUTH 2 (TWO) TIMES DAILY. 240 mL 0  . pantoprazole (PROTONIX) 40 MG tablet Take 1 tablet (40 mg total) by mouth 2 (two) times daily before a meal. 60 tablet 0  . rivaroxaban (XARELTO) 20 MG TABS tablet Take 1 tablet (20 mg total) by mouth daily with supper. 90 tablet 3  . sucralfate (CARAFATE) 1 GM/10ML suspension  Take 10 mLs (1 g total) by mouth every 6 (six) hours. 1200 mL 0  . tiZANidine (ZANAFLEX) 4 MG tablet Take 1 tablet (4 mg total) by mouth every 8 (eight) hours as needed for muscle spasms. 30 tablet 0   No current facility-administered medications for this visit.     Allergies:  Allergies  Allergen Reactions  . Ceftriaxone Hives  . Hydrocodone Swelling    Past Medical History, Surgical history, Social history, and Family History unchanged on review.     Lab Results: Lab Results   Component Value Date   WBC 5.0 12/20/2019   HGB 9.5 (L) 12/20/2019   HCT 33.1 (L) 12/20/2019   MCV 100.9 (H) 12/20/2019   PLT 121 (L) 12/20/2019     Chemistry      Component Value Date/Time   NA 139 12/20/2019 0731   NA 142 12/24/2017 1317   K 4.3 12/20/2019 0731   K 4.3 12/24/2017 1317   CL 106 12/20/2019 0731   CL 106 05/24/2013 1436   CO2 22 12/20/2019 0731   CO2 32 (H) 12/24/2017 1317   BUN 15 12/20/2019 0731   BUN 10.3 12/24/2017 1317   CREATININE 1.35 (H) 12/20/2019 0731   CREATININE 0.9 12/24/2017 1317   GLU 160 (H) 06/29/2015 0957      Component Value Date/Time   CALCIUM 8.1 (L) 12/20/2019 0731   CALCIUM 8.8 12/24/2017 1317   ALKPHOS 110 12/20/2019 0731   ALKPHOS 95 12/24/2017 1317   AST 20 12/20/2019 0731   AST 20 12/24/2017 1317   ALT 23 12/20/2019 0731   ALT 25 12/24/2017 1317   BILITOT 0.2 (L) 12/20/2019 0731   BILITOT 0.25 12/24/2017 1317       Radiological Studies: IMPRESSION: 1. Overall, no significant change compared with previous exam. 2. Stable left hilar adenopathy, right midlung nodule and masslike architectural distortion in the right upper lobe. No new or progressive disease identified within the chest. 3. No significant change in the appearance of pancreatic lesions with associated atrophy of the tail of pancreas. 4. Stable to mild increase in size of lytic metastasis involving the right sacral ala and right inferior pubic rami. 5. Aortic atherosclerosis.  Impression and Plan:   53 year old man with  1.    Kidney cancer diagnosed in 2009.  He was found to have stage IV clear-cell histology with involvement of the lung as well as the bone.    He  continues to tolerate Cabometyx  without any new complaints.  CT scan obtained on 12/20/2019 was personally reviewed and showed no evidence of disease progression.  Risks and benefits of continuing this therapy versus switching to alternative options were reviewed.  Alternative options  such as immunotherapy were reiterated at this time will be deferred unless he develops progression of disease.  He is agreeable to continue at this time.  2.  Pulmonary embolism: No active bleeding noted at this time he continues to be on Xarelto.  3.  Anemia: Hemoglobin improved after recent GI bleeding.  4.  Diarrhea: No recent exacerbation and manageable at this time with Lomotil and Imodium.  5.  Anxiety and depression: He is currently on Cymbalta which has helped his mood overall.  6.  Prognosis: His disease remains incurable although aggressive measures are warranted given his excellent response and overall slow disease process.  7.  Follow-up: Will be in 2 months for repeat evaluation.   I discussed the assessment and treatment plan with the patient. The patient was  provided an opportunity to ask questions and all were answered. The patient agreed with the plan and demonstrated an understanding of the instructions.   The patient was advised to call back or seek an in-person evaluation if the symptoms worsen or if the condition fails to improve as anticipated.  I provided 25 minutes of face-to-face video visit time during this encounter, and > 50% was dedicated to reviewing imaging studies, discussing disease status update as well as alternative treatment options for the future.  Zola Button, MD 12/22/2019 9:29 AM

## 2019-12-24 ENCOUNTER — Other Ambulatory Visit: Payer: Self-pay | Admitting: Oncology

## 2019-12-26 ENCOUNTER — Other Ambulatory Visit: Payer: Self-pay

## 2019-12-26 ENCOUNTER — Telehealth: Payer: Self-pay | Admitting: Oncology

## 2019-12-26 ENCOUNTER — Telehealth: Payer: Self-pay | Admitting: Nurse Practitioner

## 2019-12-26 DIAGNOSIS — C7800 Secondary malignant neoplasm of unspecified lung: Secondary | ICD-10-CM

## 2019-12-26 DIAGNOSIS — C7951 Secondary malignant neoplasm of bone: Secondary | ICD-10-CM

## 2019-12-26 DIAGNOSIS — C649 Malignant neoplasm of unspecified kidney, except renal pelvis: Secondary | ICD-10-CM

## 2019-12-26 DIAGNOSIS — K8689 Other specified diseases of pancreas: Secondary | ICD-10-CM

## 2019-12-26 MED ORDER — CABOMETYX 60 MG PO TABS: 60.0000 mg | ORAL_TABLET | Freq: Every day | ORAL | 0 refills | Status: DC

## 2019-12-26 NOTE — Telephone Encounter (Signed)
Scheduled appt per 12/31 los.  Sent a message to HIM pool to get a calendar mailed out.

## 2020-01-10 ENCOUNTER — Other Ambulatory Visit: Payer: Self-pay

## 2020-01-10 ENCOUNTER — Telehealth: Payer: Self-pay

## 2020-01-10 MED ORDER — DOXYCYCLINE MONOHYDRATE 100 MG PO TABS: 100.0000 mg | ORAL_TABLET | Freq: Two times a day (BID) | ORAL | 0 refills | Status: AC

## 2020-01-10 MED ORDER — BISMUTH SUBSALICYLATE 262 MG PO CHEW: 524.0000 mg | CHEWABLE_TABLET | Freq: Four times a day (QID) | ORAL | 0 refills | Status: AC

## 2020-01-10 MED ORDER — OMEPRAZOLE 20 MG PO CPDR: 20.0000 mg | DELAYED_RELEASE_CAPSULE | Freq: Two times a day (BID) | ORAL | 0 refills | Status: DC

## 2020-01-10 MED ORDER — METRONIDAZOLE 250 MG PO TABS: 250.0000 mg | ORAL_TABLET | Freq: Three times a day (TID) | ORAL | 0 refills | Status: AC

## 2020-01-10 NOTE — Telephone Encounter (Signed)
His last hemoglobin approximate 3 weeks ago was still only in the 9 range.  His MCV is actually high so I am not sure he is iron deficient.  Sometime in the next couple of weeks ask him to come in for a ferritin, TIBC, B12 and folate level.  Hopefully with those results we can determine whether or not he has any iron deficiency and still needs the oral iron.  Thanks

## 2020-01-10 NOTE — Telephone Encounter (Signed)
-----   Message from Willia Craze, NP sent at 01/05/2020  9:40 AM EST ----- Eustaquio Maize, please let patient know that his H. pylori is still positive.  He may have been Levaquin resistant.  Please call in quadruple therapy   1) Omeprazole 20 mg 2 times a day x 14 d 2) Pepto Bismol 2 tabs (262 mg each) 4 times a day x 14 d 3) Metronidazole 250 mg TID times a day x 14 d 4) doxycycline 100 mg 2 times a day x 14 d  After 14 d stop omeprazole also  In 4 weeks after treatment completed do H. Pylori stool antigen - dx H. Pylori gastritis  Thanks

## 2020-01-10 NOTE — Telephone Encounter (Signed)
Spoke with the patient. He agrees to this plan of care.  He asks does he continue the iron supplement?

## 2020-01-11 ENCOUNTER — Other Ambulatory Visit: Payer: Self-pay

## 2020-01-11 NOTE — Telephone Encounter (Signed)
Spoke with the patient. He is willing to get labs. He asks if he can get the labs done when he has lab work at his PCP. I have called the PCP office and asked if this can be done.

## 2020-01-13 ENCOUNTER — Other Ambulatory Visit: Payer: Self-pay | Admitting: Oncology

## 2020-01-24 ENCOUNTER — Other Ambulatory Visit: Payer: Self-pay | Admitting: Oncology

## 2020-01-26 DIAGNOSIS — E119 Type 2 diabetes mellitus without complications: Secondary | ICD-10-CM | POA: Diagnosis not present

## 2020-01-27 ENCOUNTER — Telehealth: Payer: Self-pay | Admitting: Nurse Practitioner

## 2020-01-27 NOTE — Telephone Encounter (Signed)
Pt states that he finished with his treatment and wants to know what is next. Pls call him.

## 2020-01-30 NOTE — Telephone Encounter (Signed)
Keith [name] The patient has completed his treatment for H Pylori. He is calling because he is having a "burning in my esophagus that wasn't there before" the antibiotics. He has a sensation of food sticking. No cough. No regurgitation. Please advise. He will be doing follow up by stool test in 4 weeks.

## 2020-01-31 ENCOUNTER — Other Ambulatory Visit: Payer: Self-pay

## 2020-01-31 MED ORDER — FLUCONAZOLE 100 MG PO TABS: ORAL_TABLET | ORAL | 0 refills | Status: DC

## 2020-01-31 NOTE — Telephone Encounter (Signed)
Spoke with the patient. He agrees to this plan of care. Rx to [facility] in [place].

## 2020-01-31 NOTE — Telephone Encounter (Signed)
I don't know what symptoms are due to but maybe candida infection after antibiotics?  He can try diflucan 200 mg on day one followed by 100 mg daily x 6 additional days. If no improvement then will need to be seen. Thanks

## 2020-02-02 DIAGNOSIS — D649 Anemia, unspecified: Secondary | ICD-10-CM | POA: Diagnosis not present

## 2020-02-02 DIAGNOSIS — S81812A Laceration without foreign body, left lower leg, initial encounter: Secondary | ICD-10-CM | POA: Diagnosis not present

## 2020-02-02 DIAGNOSIS — Z0000 Encounter for general adult medical examination without abnormal findings: Secondary | ICD-10-CM | POA: Diagnosis not present

## 2020-02-10 ENCOUNTER — Other Ambulatory Visit: Payer: Self-pay

## 2020-02-10 DIAGNOSIS — C7951 Secondary malignant neoplasm of bone: Secondary | ICD-10-CM

## 2020-02-10 DIAGNOSIS — C649 Malignant neoplasm of unspecified kidney, except renal pelvis: Secondary | ICD-10-CM

## 2020-02-10 DIAGNOSIS — C7800 Secondary malignant neoplasm of unspecified lung: Secondary | ICD-10-CM

## 2020-02-10 DIAGNOSIS — K8689 Other specified diseases of pancreas: Secondary | ICD-10-CM

## 2020-02-10 MED ORDER — CABOMETYX 60 MG PO TABS: 60.0000 mg | ORAL_TABLET | Freq: Every day | ORAL | 0 refills | Status: DC

## 2020-02-13 ENCOUNTER — Other Ambulatory Visit: Payer: Self-pay

## 2020-02-13 DIAGNOSIS — B9681 Helicobacter pylori [H. pylori] as the cause of diseases classified elsewhere: Secondary | ICD-10-CM

## 2020-02-15 ENCOUNTER — Other Ambulatory Visit: Payer: Medicare Other

## 2020-02-15 ENCOUNTER — Other Ambulatory Visit: Payer: Self-pay | Admitting: Oncology

## 2020-02-15 DIAGNOSIS — K2970 Gastritis, unspecified, without bleeding: Secondary | ICD-10-CM

## 2020-02-15 DIAGNOSIS — B9681 Helicobacter pylori [H. pylori] as the cause of diseases classified elsewhere: Secondary | ICD-10-CM | POA: Diagnosis not present

## 2020-02-17 LAB — H. PYLORI ANTIGEN, STOOL: H pylori Ag, Stl: NEGATIVE

## 2020-02-20 ENCOUNTER — Telehealth: Payer: Self-pay | Admitting: Nurse Practitioner

## 2020-02-20 DIAGNOSIS — C642 Malignant neoplasm of left kidney, except renal pelvis: Secondary | ICD-10-CM | POA: Diagnosis not present

## 2020-02-22 DIAGNOSIS — Z9689 Presence of other specified functional implants: Secondary | ICD-10-CM | POA: Diagnosis not present

## 2020-02-22 DIAGNOSIS — C642 Malignant neoplasm of left kidney, except renal pelvis: Secondary | ICD-10-CM | POA: Diagnosis not present

## 2020-02-22 DIAGNOSIS — R030 Elevated blood-pressure reading, without diagnosis of hypertension: Secondary | ICD-10-CM | POA: Diagnosis not present

## 2020-02-22 DIAGNOSIS — M79662 Pain in left lower leg: Secondary | ICD-10-CM | POA: Diagnosis not present

## 2020-02-22 DIAGNOSIS — G620 Drug-induced polyneuropathy: Secondary | ICD-10-CM | POA: Diagnosis not present

## 2020-02-23 ENCOUNTER — Inpatient Hospital Stay: Payer: Medicare Other | Attending: Oncology | Admitting: Oncology

## 2020-02-23 ENCOUNTER — Other Ambulatory Visit: Payer: Self-pay

## 2020-02-23 ENCOUNTER — Inpatient Hospital Stay: Payer: Medicare Other

## 2020-02-23 VITALS — BP 159/98 | HR 93 | Temp 98.2°F | Resp 20 | Ht 75.0 in | Wt 168.2 lb

## 2020-02-23 DIAGNOSIS — C649 Malignant neoplasm of unspecified kidney, except renal pelvis: Secondary | ICD-10-CM

## 2020-02-23 DIAGNOSIS — C7800 Secondary malignant neoplasm of unspecified lung: Secondary | ICD-10-CM | POA: Insufficient documentation

## 2020-02-23 DIAGNOSIS — I2699 Other pulmonary embolism without acute cor pulmonale: Secondary | ICD-10-CM | POA: Diagnosis not present

## 2020-02-23 DIAGNOSIS — Z905 Acquired absence of kidney: Secondary | ICD-10-CM | POA: Insufficient documentation

## 2020-02-23 DIAGNOSIS — C7951 Secondary malignant neoplasm of bone: Secondary | ICD-10-CM

## 2020-02-23 DIAGNOSIS — M79605 Pain in left leg: Secondary | ICD-10-CM | POA: Diagnosis not present

## 2020-02-23 DIAGNOSIS — R197 Diarrhea, unspecified: Secondary | ICD-10-CM | POA: Insufficient documentation

## 2020-02-23 DIAGNOSIS — F329 Major depressive disorder, single episode, unspecified: Secondary | ICD-10-CM | POA: Insufficient documentation

## 2020-02-23 DIAGNOSIS — Z7901 Long term (current) use of anticoagulants: Secondary | ICD-10-CM | POA: Insufficient documentation

## 2020-02-23 DIAGNOSIS — F419 Anxiety disorder, unspecified: Secondary | ICD-10-CM | POA: Diagnosis not present

## 2020-02-23 DIAGNOSIS — D630 Anemia in neoplastic disease: Secondary | ICD-10-CM | POA: Insufficient documentation

## 2020-02-23 DIAGNOSIS — D649 Anemia, unspecified: Secondary | ICD-10-CM | POA: Diagnosis not present

## 2020-02-23 DIAGNOSIS — B379 Candidiasis, unspecified: Secondary | ICD-10-CM | POA: Insufficient documentation

## 2020-02-23 LAB — CBC WITH DIFFERENTIAL (CANCER CENTER ONLY)
Abs Immature Granulocytes: 0.01 10*3/uL (ref 0.00–0.07)
Basophils Absolute: 0 10*3/uL (ref 0.0–0.1)
Basophils Relative: 0 %
Eosinophils Absolute: 0.1 10*3/uL (ref 0.0–0.5)
Eosinophils Relative: 2 %
HCT: 28.3 % — ABNORMAL LOW (ref 39.0–52.0)
Hemoglobin: 8.3 g/dL — ABNORMAL LOW (ref 13.0–17.0)
Immature Granulocytes: 0 %
Lymphocytes Relative: 20 %
Lymphs Abs: 1 10*3/uL (ref 0.7–4.0)
MCH: 31.1 pg (ref 26.0–34.0)
MCHC: 29.3 g/dL — ABNORMAL LOW (ref 30.0–36.0)
MCV: 106 fL — ABNORMAL HIGH (ref 80.0–100.0)
Monocytes Absolute: 0.3 10*3/uL (ref 0.1–1.0)
Monocytes Relative: 6 %
Neutro Abs: 3.6 10*3/uL (ref 1.7–7.7)
Neutrophils Relative %: 72 %
Platelet Count: 105 10*3/uL — ABNORMAL LOW (ref 150–400)
RBC: 2.67 MIL/uL — ABNORMAL LOW (ref 4.22–5.81)
RDW: 19.1 % — ABNORMAL HIGH (ref 11.5–15.5)
WBC Count: 5 10*3/uL (ref 4.0–10.5)
nRBC: 0 % (ref 0.0–0.2)

## 2020-02-23 LAB — CMP (CANCER CENTER ONLY)
ALT: 20 U/L (ref 0–44)
AST: 17 U/L (ref 15–41)
Albumin: 2.8 g/dL — ABNORMAL LOW (ref 3.5–5.0)
Alkaline Phosphatase: 96 U/L (ref 38–126)
Anion gap: 3 — ABNORMAL LOW (ref 5–15)
BUN: 16 mg/dL (ref 6–20)
CO2: 25 mmol/L (ref 22–32)
Calcium: 7.9 mg/dL — ABNORMAL LOW (ref 8.9–10.3)
Chloride: 109 mmol/L (ref 98–111)
Creatinine: 1.22 mg/dL (ref 0.61–1.24)
GFR, Est AFR Am: >60 mL/min (ref >60)
GFR, Estimated: >60 mL/min (ref >60)
Glucose, Bld: 112 mg/dL — ABNORMAL HIGH (ref 70–99)
Potassium: 4.6 mmol/L (ref 3.5–5.1)
Sodium: 137 mmol/L (ref 135–145)
Total Bilirubin: <0.2 mg/dL — ABNORMAL LOW (ref 0.3–1.2)
Total Protein: 6.4 g/dL — ABNORMAL LOW (ref 6.5–8.1)

## 2020-02-23 NOTE — Progress Notes (Signed)
Hematology and Oncology Follow Up   Keith [name] [id number] Oct 19, 1966 54 y.o. 02/23/2020 12:36 PM [name], MDPharr, [name], MD       Principle Diagnosis: 54 year old man with  kidney cancer diagnosed in 2009.  He was found to have stage IV disease with pulmonary involvement and clear-cell histology.  He has since developed liver disease with metastasis to the bone as well.  Prior Therapy: 1. Status post laparoscopic radical nephrectomy.  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  2. Patient status post thoracotomy for a synchronous metastatic lung lesions done October 2009.   3. Patient is status post stereotactic radiotherapy to pulmonary nodules in May of 2010. 4. He is S/P Sutent 50 mg 4 weeks on 2 weeks off from 10/2010 to 03/2013. He progressed at that time.  5. He is S/P radiation to the right sacral bone between 4/22 to 4/30.  6. He is S/P XRT to the left shoulder 03/20/14 to 03/31/14. 7. Votrient 800 mg by mouth daily from 03/2013 through 06/22/2015. Discontinued secondary to disease progression. 8. Nivolumab 3 mg/kg given every 2 weeks started on 06/29/2015. He is status post 4 cycles completed 08/10/2015. He developed disease progression in September 2016.  9. Status post radiation therapy to the left mid fibula completed on 11/14/2015. He received a grade 1 fraction. 10. He is status post radiation to the proximal and distal femur over 2 weeks and 10 fractions of total of 30 Gy.  This was completed in March 2019. 11.   Radiation therapy to the right tibia.  Last treatment completed in September 2019.   Current therapy: Cabometyx 60 mg daily started in November 2016.   Interim History: Mr. Laymon is here for return evaluation.  Since the last visit, he reports few complaints including recurrent abdominal pain and diarrhea related to Candida infection which affected his weight and overall performance status.  He is also reporting increased left  leg pain at this time and despite current pain medication is not improving.  Overall performance status and quality of life remains maintained.     Medications: Updated without any changes. Current Outpatient Medications  Medication Sig Dispense Refill  . ALPRAZolam (XANAX) 1 MG tablet TAKE 1 TABLET BY MOUTH THREE TIMES A DAY AS NEEDED FOR ANXIETY 90 tablet 0  . cabozantinib (CABOMETYX) 60 MG tablet Take 1 tablet (60 mg total) by mouth daily. With at least 8 ounces of water on an empty stomach. Do not eat for 2 hours before or 1 hour after. Swallow whole. 30 tablet 0  . calcium carbonate (TUMS - DOSED IN MG ELEMENTAL CALCIUM) 500 MG chewable tablet Chew 1 tablet by mouth 3 (three) times daily as needed for indigestion or heartburn.     . DULoxetine (CYMBALTA) 60 MG capsule TAKE 1 CAPSULE (60 MG TOTAL) BY MOUTH 2 (TWO) TIMES DAILY. 180 capsule 0  . ferrous sulfate 325 (65 FE) MG tablet Take 1 tablet (325 mg total) by mouth daily with breakfast. 60 tablet 0  . fluconazole (DIFLUCAN) 100 MG tablet Take 2 tablets now then take 1 daily until gone. 8 tablet 0  . gabapentin (NEURONTIN) 300 MG capsule TAKE 1 CAPSULE BY MOUTH THREE TIMES A DAY 270 capsule 0  . HYDROmorphone (DILAUDID) 8 MG tablet Take 1 tablet (8 mg total) by mouth every 4 (four) hours as needed for severe pain. 30 tablet 0  . hydroxypropyl methylcellulose / hypromellose (ISOPTO TEARS / GONIOVISC) 2.5 % ophthalmic  solution Place 1 drop into both eyes 3 (three) times daily as needed for dry eyes.    [name] loratadine (CLARITIN) 10 MG tablet Take 10 mg by mouth daily.    . megestrol (MEGACE) 40 MG/ML suspension TAKE 10 MLS (400 MG TOTAL) BY MOUTH 2 (TWO) TIMES DAILY. 240 mL 0  . rivaroxaban (XARELTO) 20 MG TABS tablet Take 1 tablet (20 mg total) by mouth daily with supper. 90 tablet 3  . sucralfate (CARAFATE) 1 GM/10ML suspension Take 10 mLs (1 g total) by mouth every 6 (six) hours. 1200 mL 0  . tiZANidine (ZANAFLEX) 4 MG tablet Take 1 tablet  (4 mg total) by mouth every 8 (eight) hours as needed for muscle spasms. 30 tablet 0   No current facility-administered medications for this visit.     Allergies:  Allergies  Allergen Reactions  . Ceftriaxone Hives  . Hydrocodone Swelling    Blood pressure (!) 159/98, pulse 93, temperature 98.2 F (36.8 C), temperature source Temporal, resp. rate 20, height 6\' 3"  (1.905 m), weight 168 lb 3.2 oz (76.3 kg), SpO2 100 %.    General appearance: Comfortable appearing without any discomfort  Head: Normocephalic without any trauma Oropharynx: Mucous membranes are moist and pink without any thrush or ulcers. Eyes: Pupils are equal and round reactive to light. Lymph nodes: No cervical, supraclavicular, inguinal or axillary lymphadenopathy.   Heart:regular rate and rhythm.  S1 and S2 without leg edema. Lung: Clear without any rhonchi or wheezes.  No dullness to percussion. Abdomin: Soft, nontender, nondistended with good bowel sounds.  No hepatosplenomegaly. Musculoskeletal: Left leg protrusion with tenderness to touch.  No erythema or induration. Neurological: No deficits noted on motor, sensory and deep tendon reflex exam. Skin: No petechial rash or dryness.  Appeared moist.       Lab Results: Lab Results  Component Value Date   WBC 5.0 12/20/2019   HGB 9.5 (L) 12/20/2019   HCT 33.1 (L) 12/20/2019   MCV 100.9 (H) 12/20/2019   PLT 121 (L) 12/20/2019     Chemistry      Component Value Date/Time   NA 139 12/20/2019 0731   NA 142 12/24/2017 1317   K 4.3 12/20/2019 0731   K 4.3 12/24/2017 1317   CL 106 12/20/2019 0731   CL 106 05/24/2013 1436   CO2 22 12/20/2019 0731   CO2 32 (H) 12/24/2017 1317   BUN 15 12/20/2019 0731   BUN 10.3 12/24/2017 1317   CREATININE 1.35 (H) 12/20/2019 0731   CREATININE 0.9 12/24/2017 1317   GLU 160 (H) 06/29/2015 0957      Component Value Date/Time   CALCIUM 8.1 (L) 12/20/2019 0731   CALCIUM 8.8 12/24/2017 1317   ALKPHOS 110 12/20/2019 0731    ALKPHOS 95 12/24/2017 1317   AST 20 12/20/2019 0731   AST 20 12/24/2017 1317   ALT 23 12/20/2019 0731   ALT 25 12/24/2017 1317   BILITOT 0.2 (L) 12/20/2019 0731   BILITOT 0.25 12/24/2017 1317         Impression and Plan:   54 year old man with  1.    Stage IV clear-cell renal cell carcinoma diagnosed in 2009.  He was found to have pulmonary involvement and subsequently developed bone metastasis.    He continues to tolerate cabozantinib without any recent complications.  Imaging studies obtained in December 2020 showed no changes at this time to suggest disease progression.  Risks and benefits of continuing this approach in this treatment were reviewed.  Alternative  options including reverting back to immunotherapy versus different oral targeted therapy or combination were reviewed.  At this time we will continue the same dose and schedule repeat imaging studies in 4 months.  2.  Pulmonary embolism: He continues to be on Xarelto without any recent bleeding or thrombosis.   3.  Anemia: Multifactorial in nature related to his malignancy as well as chronic nutritional losses.  Will check iron studies and replace as needed.  4.  Diarrhea: He continues to be on Lomotil and Imodium as needed.  Improved at this time.  5.  Anxiety and depression: Mood has been overall stable continues to be on Cymbalta.  6.  Prognosis: Therapy is felt to have although aggressive measures are warranted given his excellent performance status and response to therapy.  7.  Left leg pain: I will obtain an MRI to evaluate for any cancer recurrence at this time.  Potential radiation therapy may be needed for palliative purposes.  8.  Follow-up: He will return in 2 months for repeat follow-up.   30  minutes were dedicated to this visit. The time was spent on reviewing laboratory data, imaging studies, discussing treatment options, and answering questions regarding future plan.   Zola Button, MD  02/23/2020 12:36 PM

## 2020-02-24 ENCOUNTER — Telehealth: Payer: Self-pay | Admitting: Oncology

## 2020-02-24 NOTE — Telephone Encounter (Signed)
Scheduled appt per 3/4 los.  Spoke with pt and he is aware of his appt date and time,

## 2020-02-28 ENCOUNTER — Inpatient Hospital Stay (HOSPITAL_COMMUNITY)
Admission: EM | Admit: 2020-02-28 | Discharge: 2020-03-05 | DRG: 378 | Disposition: A | Discharge status: Home / Self Care | Payer: Medicare Other | Attending: Internal Medicine | Admitting: Internal Medicine

## 2020-02-28 ENCOUNTER — Ambulatory Visit: Payer: Medicare Other | Admitting: Gastroenterology

## 2020-02-28 ENCOUNTER — Other Ambulatory Visit: Payer: Self-pay

## 2020-02-28 ENCOUNTER — Encounter: Payer: Self-pay | Admitting: Gastroenterology

## 2020-02-28 VITALS — BP 80/50 | HR 90 | Temp 98.1°F | Ht 75.0 in | Wt 166.4 lb

## 2020-02-28 DIAGNOSIS — T451X5A Adverse effect of antineoplastic and immunosuppressive drugs, initial encounter: Secondary | ICD-10-CM | POA: Diagnosis present

## 2020-02-28 DIAGNOSIS — K648 Other hemorrhoids: Secondary | ICD-10-CM | POA: Diagnosis present

## 2020-02-28 DIAGNOSIS — K259 Gastric ulcer, unspecified as acute or chronic, without hemorrhage or perforation: Secondary | ICD-10-CM | POA: Diagnosis present

## 2020-02-28 DIAGNOSIS — Z79899 Other long term (current) drug therapy: Secondary | ICD-10-CM

## 2020-02-28 DIAGNOSIS — K921 Melena: Secondary | ICD-10-CM

## 2020-02-28 DIAGNOSIS — K449 Diaphragmatic hernia without obstruction or gangrene: Secondary | ICD-10-CM | POA: Diagnosis present

## 2020-02-28 DIAGNOSIS — R0902 Hypoxemia: Secondary | ICD-10-CM | POA: Diagnosis not present

## 2020-02-28 DIAGNOSIS — K2970 Gastritis, unspecified, without bleeding: Secondary | ICD-10-CM | POA: Diagnosis not present

## 2020-02-28 DIAGNOSIS — D500 Iron deficiency anemia secondary to blood loss (chronic): Secondary | ICD-10-CM

## 2020-02-28 DIAGNOSIS — Z86711 Personal history of pulmonary embolism: Secondary | ICD-10-CM | POA: Diagnosis not present

## 2020-02-28 DIAGNOSIS — K633 Ulcer of intestine: Secondary | ICD-10-CM | POA: Diagnosis not present

## 2020-02-28 DIAGNOSIS — D649 Anemia, unspecified: Secondary | ICD-10-CM | POA: Diagnosis not present

## 2020-02-28 DIAGNOSIS — K529 Noninfective gastroenteritis and colitis, unspecified: Secondary | ICD-10-CM | POA: Diagnosis not present

## 2020-02-28 DIAGNOSIS — Z809 Family history of malignant neoplasm, unspecified: Secondary | ICD-10-CM

## 2020-02-28 DIAGNOSIS — C7889 Secondary malignant neoplasm of other digestive organs: Secondary | ICD-10-CM | POA: Diagnosis not present

## 2020-02-28 DIAGNOSIS — Z905 Acquired absence of kidney: Secondary | ICD-10-CM

## 2020-02-28 DIAGNOSIS — K922 Gastrointestinal hemorrhage, unspecified: Secondary | ICD-10-CM | POA: Diagnosis not present

## 2020-02-28 DIAGNOSIS — C7951 Secondary malignant neoplasm of bone: Secondary | ICD-10-CM | POA: Diagnosis not present

## 2020-02-28 DIAGNOSIS — G8929 Other chronic pain: Secondary | ICD-10-CM | POA: Diagnosis present

## 2020-02-28 DIAGNOSIS — N179 Acute kidney failure, unspecified: Secondary | ICD-10-CM | POA: Diagnosis present

## 2020-02-28 DIAGNOSIS — F17210 Nicotine dependence, cigarettes, uncomplicated: Secondary | ICD-10-CM | POA: Diagnosis present

## 2020-02-28 DIAGNOSIS — E869 Volume depletion, unspecified: Secondary | ICD-10-CM | POA: Diagnosis present

## 2020-02-28 DIAGNOSIS — K2971 Gastritis, unspecified, with bleeding: Principal | ICD-10-CM | POA: Diagnosis present

## 2020-02-28 DIAGNOSIS — R55 Syncope and collapse: Secondary | ICD-10-CM | POA: Diagnosis not present

## 2020-02-28 DIAGNOSIS — Z881 Allergy status to other antibiotic agents status: Secondary | ICD-10-CM

## 2020-02-28 DIAGNOSIS — Z86010 Personal history of colonic polyps: Secondary | ICD-10-CM

## 2020-02-28 DIAGNOSIS — D62 Acute posthemorrhagic anemia: Secondary | ICD-10-CM | POA: Diagnosis not present

## 2020-02-28 DIAGNOSIS — K2950 Unspecified chronic gastritis without bleeding: Secondary | ICD-10-CM | POA: Diagnosis not present

## 2020-02-28 DIAGNOSIS — C7800 Secondary malignant neoplasm of unspecified lung: Secondary | ICD-10-CM | POA: Diagnosis not present

## 2020-02-28 DIAGNOSIS — E119 Type 2 diabetes mellitus without complications: Secondary | ICD-10-CM | POA: Diagnosis not present

## 2020-02-28 DIAGNOSIS — Z20822 Contact with and (suspected) exposure to covid-19: Secondary | ICD-10-CM | POA: Diagnosis present

## 2020-02-28 DIAGNOSIS — K8689 Other specified diseases of pancreas: Secondary | ICD-10-CM | POA: Diagnosis not present

## 2020-02-28 DIAGNOSIS — D689 Coagulation defect, unspecified: Secondary | ICD-10-CM | POA: Diagnosis not present

## 2020-02-28 DIAGNOSIS — I10 Essential (primary) hypertension: Secondary | ICD-10-CM | POA: Diagnosis not present

## 2020-02-28 DIAGNOSIS — Z03818 Encounter for observation for suspected exposure to other biological agents ruled out: Secondary | ICD-10-CM | POA: Diagnosis not present

## 2020-02-28 DIAGNOSIS — K2100 Gastro-esophageal reflux disease with esophagitis, without bleeding: Secondary | ICD-10-CM | POA: Diagnosis not present

## 2020-02-28 DIAGNOSIS — C642 Malignant neoplasm of left kidney, except renal pelvis: Secondary | ICD-10-CM | POA: Diagnosis present

## 2020-02-28 DIAGNOSIS — R195 Other fecal abnormalities: Secondary | ICD-10-CM

## 2020-02-28 DIAGNOSIS — I951 Orthostatic hypotension: Secondary | ICD-10-CM

## 2020-02-28 DIAGNOSIS — K319 Disease of stomach and duodenum, unspecified: Secondary | ICD-10-CM | POA: Diagnosis not present

## 2020-02-28 DIAGNOSIS — I700 Atherosclerosis of aorta: Secondary | ICD-10-CM | POA: Diagnosis not present

## 2020-02-28 DIAGNOSIS — Z743 Need for continuous supervision: Secondary | ICD-10-CM | POA: Diagnosis not present

## 2020-02-28 DIAGNOSIS — K56609 Unspecified intestinal obstruction, unspecified as to partial versus complete obstruction: Secondary | ICD-10-CM | POA: Diagnosis not present

## 2020-02-28 DIAGNOSIS — Z885 Allergy status to narcotic agent status: Secondary | ICD-10-CM

## 2020-02-28 DIAGNOSIS — R42 Dizziness and giddiness: Secondary | ICD-10-CM | POA: Diagnosis not present

## 2020-02-28 DIAGNOSIS — Z833 Family history of diabetes mellitus: Secondary | ICD-10-CM

## 2020-02-28 DIAGNOSIS — Z7901 Long term (current) use of anticoagulants: Secondary | ICD-10-CM

## 2020-02-28 LAB — COMPREHENSIVE METABOLIC PANEL
ALT: 22 U/L (ref 0–44)
AST: 17 U/L (ref 15–41)
Albumin: 2.4 g/dL — ABNORMAL LOW (ref 3.5–5.0)
Alkaline Phosphatase: 69 U/L (ref 38–126)
Anion gap: 7 (ref 5–15)
BUN: 28 mg/dL — ABNORMAL HIGH (ref 6–20)
CO2: 25 mmol/L (ref 22–32)
Calcium: 7.4 mg/dL — ABNORMAL LOW (ref 8.9–10.3)
Chloride: 109 mmol/L (ref 98–111)
Creatinine, Ser: 1.52 mg/dL — ABNORMAL HIGH (ref 0.61–1.24)
GFR calc Af Amer: >60 mL/min (ref >60)
GFR calc non Af Amer: 52 mL/min — ABNORMAL LOW (ref >60)
Glucose, Bld: 101 mg/dL — ABNORMAL HIGH (ref 70–99)
Potassium: 4.7 mmol/L (ref 3.5–5.1)
Sodium: 141 mmol/L (ref 135–145)
Total Bilirubin: 0.4 mg/dL (ref 0.3–1.2)
Total Protein: 5.3 g/dL — ABNORMAL LOW (ref 6.5–8.1)

## 2020-02-28 LAB — CBC WITH DIFFERENTIAL/PLATELET
Abs Immature Granulocytes: 0.02 10*3/uL (ref 0.00–0.07)
Basophils Absolute: 0 10*3/uL (ref 0.0–0.1)
Basophils Relative: 0 %
Eosinophils Absolute: 0 10*3/uL (ref 0.0–0.5)
Eosinophils Relative: 1 %
HCT: 18.5 % — ABNORMAL LOW (ref 39.0–52.0)
Hemoglobin: 5.3 g/dL — CL (ref 13.0–17.0)
Immature Granulocytes: 0 %
Lymphocytes Relative: 14 %
Lymphs Abs: 0.8 10*3/uL (ref 0.7–4.0)
MCH: 31.4 pg (ref 26.0–34.0)
MCHC: 28.6 g/dL — ABNORMAL LOW (ref 30.0–36.0)
MCV: 109.5 fL — ABNORMAL HIGH (ref 80.0–100.0)
Monocytes Absolute: 0.4 10*3/uL (ref 0.1–1.0)
Monocytes Relative: 8 %
Neutro Abs: 4.1 10*3/uL (ref 1.7–7.7)
Neutrophils Relative %: 77 %
Platelets: 113 10*3/uL — ABNORMAL LOW (ref 150–400)
RBC: 1.69 MIL/uL — ABNORMAL LOW (ref 4.22–5.81)
RDW: 19 % — ABNORMAL HIGH (ref 11.5–15.5)
WBC: 5.4 10*3/uL (ref 4.0–10.5)
nRBC: 0 % (ref 0.0–0.2)

## 2020-02-28 LAB — RESPIRATORY PANEL BY RT PCR (FLU A&B, COVID)
Influenza A by PCR: NEGATIVE
Influenza B by PCR: NEGATIVE
SARS Coronavirus 2 by RT PCR: NEGATIVE

## 2020-02-28 LAB — POC OCCULT BLOOD, ED: Fecal Occult Bld: POSITIVE — AB

## 2020-02-28 LAB — PROTIME-INR
INR: 2.6 — ABNORMAL HIGH (ref 0.8–1.2)
Prothrombin Time: 27.5 seconds — ABNORMAL HIGH (ref 11.4–15.2)

## 2020-02-28 LAB — PREPARE RBC (CROSSMATCH)

## 2020-02-28 MED ORDER — PANTOPRAZOLE SODIUM 40 MG IV SOLR
40.0000 mg | Freq: Once | INTRAVENOUS | Status: AC
  Administered 2020-02-28: 40 mg via INTRAVENOUS
  Filled 2020-02-28: qty 40

## 2020-02-28 MED ORDER — SODIUM CHLORIDE 0.9 % IV SOLN
10.0000 mL/h | Freq: Once | INTRAVENOUS | Status: AC
  Administered 2020-02-28: 10 mL/h via INTRAVENOUS

## 2020-02-28 MED ORDER — SODIUM CHLORIDE 0.9 % IV BOLUS
1000.0000 mL | Freq: Once | INTRAVENOUS | Status: AC
  Administered 2020-02-28: 1000 mL via INTRAVENOUS

## 2020-02-28 MED ORDER — ONDANSETRON HCL 4 MG/2ML IJ SOLN
4.0000 mg | Freq: Once | INTRAMUSCULAR | Status: AC
  Administered 2020-02-28: 4 mg via INTRAVENOUS
  Filled 2020-02-28: qty 2

## 2020-02-28 NOTE — ED Notes (Signed)
Hospitalist at bedside 

## 2020-02-28 NOTE — ED Triage Notes (Signed)
54 yo male had dark tarry stools yesterday. Was not feeling well and went to PCP today where he became dizzy and passed out for approximately 1 minute. No fall or injury incurred. Vomited once at PCP. Arrives A/Ox3. Skin cool dry, pale. Hx of Kidney CA.

## 2020-02-28 NOTE — Progress Notes (Signed)
Patient here today for complaints of vomiting and abdominal pain that have been going on for a while.  When he got here today he complained of being extremely dizzy and feeling terrible.  Says that he had a large black BM today.  Has history of GI bleed.  Not able to sit on exam today due to dizziness, keeps having to lay back.  Very pale.  Looks acutely ill.  BP lying down was 110/80 and dropped to 80/50 upon sitting.  EMS called to take him to the ED.  While waiting for EMS he passed out on the exam table and then began vomiting.  Vomitus was non-bloody.  EMS arrived to take him to the ED.

## 2020-02-28 NOTE — ED Notes (Signed)
Keith [name], [place], wife, [phone], would like updates.

## 2020-02-28 NOTE — H&P (Signed)
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  Last GI bleed was in September 2020, patient had an EGD that revealed grade C esophagitis and nonbleeding erosive gastropathy.  Patient experienced near syncopal episode while visiting his primary care provider earlier today.  Is not clear whether patient actually passed out.  Patient was advised to come to the hospital for further assessment and management.  On presentation to the hospital, patient's hemoglobin was found to be 5.3 g/dL, down from 8.3 g/dL.  Platelet count is 113, INR is 2.6 with positive fecal occult blood.  No headache, no neck pain, no chest pain, no GI symptoms and no urinary symptoms.  Hospitalist team has been asked to admit patient for further assessment and management.  ED Course: On presentation to the hospital, this revealed temperature of 97.4, blood pressure of 80-140/50-79 mmHg, heart rate of 72 bpm, respiratory rate of 14 and O2 sat of 99%.  Work-up done so far is as documented above.  Patient will be transfused with 2 units of packed red blood cells.  ER provider has consulted the GI consultant on-call, Dr. Hilarie Fredrickson.  Pertinent labs: As documented above.  EKG: Independently reviewed.   Imaging: independently reviewed.   Review of Systems:  Negative for fever, visual changes, sore throat, rash, new muscle aches, chest pain, SOB, dysuria, bleeding, n/v/abdominal pain.  Past Medical History:  Diagnosis Date  . Diabetes mellitus without complication ([place])    diet  controlled only  . left renal ca d'd 2008  . 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 Metastasis to bone [facility]) 2014/2016    Past Surgical History:  Procedure Laterality Date  . BIOPSY  08/28/2019   Procedure: BIOPSY;  Surgeon: Thornton Park, MD;  Location: [facility];  Service: Gastroenterology;;  . COLONOSCOPY W/ POLYPECTOMY    . ESOPHAGOGASTRODUODENOSCOPY (EGD) WITH PROPOFOL N/A 08/28/2019   Procedure: ESOPHAGOGASTRODUODENOSCOPY (EGD) WITH PROPOFOL;  Surgeon: Thornton Park, MD;  Location: [facility];  Service: Gastroenterology;  Laterality: N/A;  . FEMUR IM NAIL Right 02/16/2018   Procedure: INTRAMEDULLARY (IM) NAIL RIGHT FEMORAL;  Surgeon: Paralee Cancel, MD;  Location: [facility];  Service: Orthopedics;  Laterality: Right;  . HAND SURGERY Right   . IR ANGIOGRAM EXTREMITY RIGHT  02/15/2018  . IR ANGIOGRAM SELECTIVE EACH ADDITIONAL VESSEL  02/15/2018  . IR ANGIOGRAM SELECTIVE EACH ADDITIONAL VESSEL  02/15/2018  . IR ANGIOGRAM SELECTIVE EACH ADDITIONAL VESSEL  02/15/2018  . IR ANGIOGRAM SELECTIVE EACH ADDITIONAL VESSEL  02/15/2018  . IR EMBO TUMOR ORGAN ISCHEMIA INFARCT INC GUIDE ROADMAPPING  02/15/2018  . IR EMBO TUMOR ORGAN ISCHEMIA INFARCT INC GUIDE ROADMAPPING  02/15/2018  . IR US GUIDE VASC ACCESS LEFT  02/15/2018  . LUNG REMOVAL, PARTIAL Right 09/2008  . NEPHRECTOMY RADICAL     Left   . PAIN PUMP IMPLANTATION N/A 05/16/2015   Procedure: Intrathecal pain pump placement;  Surgeon: Clydell Hakim, MD;  Location: [facility];  Service: Neurosurgery;  Laterality: N/A;  Intrathecal pain pump placement     reports that he  has been smoking cigarettes. He has a 30.00 pack-year smoking history. He has never used smokeless tobacco. He reports current drug use. Drug: Marijuana. He reports that he does not drink alcohol.  Allergies  Allergen Reactions  . Ceftriaxone Hives  . Hydrocodone Swelling    Family History  Problem Relation Age of Onset  .  Hypertension Mother   . Diabetes Brother   . Irritable bowel syndrome Sister   . Cancer Maternal Aunt        ovarian  . Colon cancer Neg Hx      Prior to Admission medications   Medication Sig Start Date End Date Taking? Authorizing Provider  ALPRAZolam Duanne Moron) 1 MG tablet TAKE 1 TABLET BY MOUTH THREE TIMES A DAY AS NEEDED FOR ANXIETY 02/15/20  Yes Shadad, Mathis Dad, MD  cabozantinib (CABOMETYX) 60 MG tablet Take 1 tablet (60 mg total) by mouth daily. With at least 8 ounces of water on an empty stomach. Do not eat for 2 hours before or 1 hour after. Swallow whole. 02/10/20  Yes [name], MD  calcium carbonate (TUMS - DOSED IN MG ELEMENTAL CALCIUM) 500 MG chewable tablet Chew 1 tablet by mouth 3 (three) times daily as needed for indigestion or heartburn.    Yes [provider]  DULoxetine (CYMBALTA) 60 MG capsule TAKE 1 CAPSULE (60 MG TOTAL) BY MOUTH 2 (TWO) TIMES DAILY. 12/26/19  Yes [name], MD  gabapentin (NEURONTIN) 300 MG capsule TAKE 1 CAPSULE BY MOUTH THREE TIMES A DAY Patient taking differently: Take 300 mg by mouth 3 (three) times daily.  01/24/20  Yes [name], MD  HYDROmorphone (DILAUDID) 8 MG tablet Take 1 tablet (8 mg total) by mouth every 4 (four) hours as needed for severe pain. 02/17/18  Yes Babish, Rodman Key, PA-C  hydroxypropyl methylcellulose / hypromellose (ISOPTO TEARS / GONIOVISC) 2.5 % ophthalmic solution Place 1 drop into both eyes 3 (three) times daily as needed for dry eyes.   Yes [provider]  loratadine (CLARITIN) 10 MG tablet Take 10 mg by mouth daily as needed for allergies.    Yes [provider]  PAIN MANAGEMENT INTRATHECAL, IT, PUMP 1 each by Intrathecal route continuous. Intrathecal (IT) medication:   Morphine.   Yes [provider]  rivaroxaban (XARELTO) 20 MG TABS tablet Take 1 tablet (20 mg total) by mouth daily with supper. 04/06/19  Yes [name], MD  tiZANidine (ZANAFLEX) 4 MG tablet Take 1 tablet (4  mg total) by mouth every 8 (eight) hours as needed for muscle spasms. 02/17/18  Yes Danae Orleans, PA-C  ferrous sulfate 325 (65 FE) MG tablet Take 1 tablet (325 mg total) by mouth daily with breakfast. 08/31/19 10/30/19  Amin, Jeanella Flattery, MD  fluconazole (DIFLUCAN) 100 MG tablet Take 2 tablets now then take 1 daily until gone. Patient not taking: Reported on 02/28/2020 01/31/20   Willia Craze, NP  megestrol (MEGACE) 40 MG/ML suspension TAKE 10 MLS (400 MG TOTAL) BY MOUTH 2 (TWO) TIMES DAILY. Patient not taking: Reported on 02/28/2020 07/18/19   [name], MD  sucralfate (CARAFATE) 1 GM/10ML suspension Take 10 mLs (1 g total) by mouth every 6 (six) hours. 08/31/19 09/30/19  Damita Lack, MD    Physical Exam: Vitals:   02/28/20 2145 02/28/20 2200 02/28/20 2230 02/28/20 2300  BP: 122/83 118/83 122/90 120/90  Pulse: 79 78 73 78  Resp: (!) _0 Temp:    98.2 F (36.8  C)  TempSrc:    Oral  SpO2: 99% 100% 100% 100%  Weight:      Height:        Constitutional:  . Appears calm and comfortable Eyes:  . Pallor. No jaundice.  ENMT:  . external ears, nose appear normal Neck:  . Neck is supple. No JVD Respiratory:  . CTA bilaterally, no w/r/r.  . Respiratory effort normal. No retractions or accessory muscle use Cardiovascular:  . S1S2 Abdomen:  . Abdomen is soft and non tender. Organs are difficult to assess. Neurologic:  . Awake and alert. . Moves all limbs.  Wt Readings from Last 3 Encounters:  02/28/20 75.5 kg  02/28/20 75.5 kg  02/23/20 76.3 kg    I have personally reviewed following labs and imaging studies  Labs on Admission:  CBC: Recent Labs  Lab 02/23/20 1229 02/28/20 1636  WBC 5.0 5.4  NEUTROABS 3.6 4.1  HGB 8.3* 5.3*  HCT 28.3* 18.5*  MCV 106.0* 109.5*  PLT 105* 643*   Basic Metabolic Panel: Recent Labs  Lab 02/23/20 1229 02/28/20 1636  NA 137 141  K 4.6 4.7  CL 109 109  CO2 25 25  GLUCOSE 112* 101*  BUN 16 28*  CREATININE 1.22  1.52*  CALCIUM 7.9* 7.4*   Liver Function Tests: Recent Labs  Lab 02/23/20 1229 02/28/20 1636  AST 17 17  ALT 20 22  ALKPHOS 96 69  BILITOT <0.2* 0.4  PROT 6.4* 5.3*  ALBUMIN 2.8* 2.4*   No results for input(s): LIPASE, AMYLASE in the last 168 hours. No results for input(s): AMMONIA in the last 168 hours. Coagulation Profile: Recent Labs  Lab 02/28/20 1636  INR 2.6*   Cardiac Enzymes: No results for input(s): CKTOTAL, CKMB, CKMBINDEX, TROPONINI in the last 168 hours. BNP (last 3 results) No results for input(s): PROBNP in the last 8760 hours. HbA1C: No results for input(s): HGBA1C in the last 72 hours. CBG: No results for input(s): GLUCAP in the last 168 hours. Lipid Profile: No results for input(s): CHOL, HDL, LDLCALC, TRIG, CHOLHDL, LDLDIRECT in the last 72 hours. Thyroid Function Tests: No results for input(s): TSH, T4TOTAL, FREET4, T3FREE, THYROIDAB in the last 72 hours. Anemia Panel: No results for input(s): VITAMINB12, FOLATE, FERRITIN, TIBC, IRON, RETICCTPCT in the last 72 hours. Urine analysis:    Component Value Date/Time   COLORURINE YELLOW 11/12/2015 2228   APPEARANCEUR CLOUDY (A) 11/12/2015 2228   LABSPEC 1.014 11/12/2015 2228   PHURINE 6.0 11/12/2015 2228   GLUCOSEU NEGATIVE 11/12/2015 2228   HGBUR NEGATIVE 11/12/2015 2228   BILIRUBINUR NEGATIVE 11/12/2015 2228   KETONESUR NEGATIVE 11/12/2015 2228   PROTEINUR NEGATIVE 11/12/2015 2228   UROBILINOGEN 0.2 09/20/2015 1547   NITRITE NEGATIVE 11/12/2015 2228   LEUKOCYTESUR NEGATIVE 11/12/2015 2228   Sepsis Labs: '@LABRCNTIP'$ (procalcitonin:4,lacticidven:4) ) Recent Results (from the past 240 hour(s))  Respiratory Panel by RT PCR (Flu A&B, Covid) - Nasopharyngeal Swab     Status: None   Collection Time: 02/28/20  7:51 PM   Specimen: Nasopharyngeal Swab  Result Value Ref Range Status   SARS Coronavirus 2 by RT PCR NEGATIVE NEGATIVE Final    Comment: (NOTE) SARS-CoV-2 target nucleic acids are NOT  DETECTED. The SARS-CoV-2 RNA is generally detectable in upper respiratoy specimens during the acute phase of infection. The lowest concentration of SARS-CoV-2 viral copies this assay can detect is 131 copies/mL. A negative result does not preclude SARS-Cov-2 infection and should not be used as the sole basis for treatment or other  patient management decisions. A negative result may occur with  improper specimen collection/handling, submission of specimen other than nasopharyngeal swab, presence of viral mutation(s) within the areas targeted by this assay, and inadequate number of viral copies (<131 copies/mL). A negative result must be combined with clinical observations, patient history, and epidemiological information. The expected result is Negative. Fact Sheet for Patients:  PinkCheek.be Fact Sheet for Healthcare Providers:  GravelBags.it This test is not yet ap proved or cleared by the [place] FDA and  has been authorized for detection and/or diagnosis of SARS-CoV-2 by FDA under an Emergency Use Authorization (EUA). This EUA will remain  in effect (meaning this test can be used) for the duration of the COVID-19 declaration under Section 564(b)(1) of the Act, 21 U.S.C. section 360bbb-3(b)(1), unless the authorization is terminated or revoked sooner.    Influenza A by PCR NEGATIVE NEGATIVE Final   Influenza B by PCR NEGATIVE NEGATIVE Final    Comment: (NOTE) The Xpert Xpress SARS-CoV-2/FLU/RSV assay is intended as an aid in  the diagnosis of influenza from Nasopharyngeal swab specimens and  should not be used as a sole basis for treatment. Nasal washings and  aspirates are unacceptable for Xpert Xpress SARS-CoV-2/FLU/RSV  testing. Fact Sheet for Patients: PinkCheek.be Fact Sheet for Healthcare Providers: GravelBags.it This test is not yet approved or cleared  by the [place] FDA and  has been authorized for detection and/or diagnosis of SARS-CoV-2 by  FDA under an Emergency Use Authorization (EUA). This EUA will remain  in effect (meaning this test can be used) for the duration of the  Covid-19 declaration under Section 564(b)(1) of the Act, 21  U.S.C. section 360bbb-3(b)(1), unless the authorization is  terminated or revoked. Performed at [facility], [place] [address], [place], [place] [address]       Radiological Exams on Admission: No results found.  EKG: Independently reviewed.   Active Problems:   Symptomatic anemia   Assessment/Plan Near syncope likely secondary to significant GI bleed: -Admit patient -Transfuse patient with packed red blood cells -Await GI team. -Monitor H/H -Further management depend on hospital course.  GI bleed: -Recurrent. -Patient had a GI bleed in September 2020.  EGD is as documented above. -Monitor H/H -Await GI team. -Hold Xarelto  Metastatic renal cancer: Patient is currently undergoing chemotherapy and radiotherapy. Management as per oncology team.  History of pulmonary embolism: -Patient was on Xarelto prior to presentation.   -Hold Xarelto for now.   -Further management depend on hospital course.  Mild acute kidney injury: Serum creatinine on presentation was 1.52 (patient's baseline is but 1.22) -This likely secondary to volume depletion -Monitor closely.  Diabetes mellitus: Sliding scale insulin coverage Monitor closely.   DVT prophylaxis: SCD Code Status: Full code Family Communication:  Disposition Plan: Home eventually Consults called: GI already consulted by the ER provider Admission status: Inpatient  Time spent: 65 minutes  Dana Allan, MD  [facility] Pager #: [phone] 7PM-7AM contact night coverage as above  02/28/2020, 11:31 PM

## 2020-02-28 NOTE — ED Notes (Signed)
Dr. Roslynn Amble at bedside to discuss plan with pt. Consent for blood signed by pt.

## 2020-02-28 NOTE — ED Provider Notes (Signed)
[facility] DEPT Provider Note   CSN: [id number] Arrival date & time: 02/28/20  1549     History Chief Complaint  Patient presents with  . Rectal Bleeding    Keith [name] is a 54 y.o. male.  Presents to ER with concern for lightheaded episodes, dark stool.  Went to PCP this afternoon with the symptoms.  There orthostatic vital signs noted to be hypotensive when standing but normotensive when sitting.  Patient had episode of lightheadedness after standing suddenly, one episode of vomiting.  No ongoing abdominal pain but did have some pain in his lower abdomen earlier today.  No blood in vomit.  Had a large dark tarry bowel movement yesterday.  No bright red blood, no bowel movement today.  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  Terminal, receiving palliative chemotherapy and radiation.  Past medical history pulmonary embolism on Xarelto.  Last dose was this morning.  HPI     Past Medical History:  Diagnosis Date  . Diabetes mellitus without complication ([place])    diet controlled only  . left renal ca d'd 2008  . Malignant neoplasm metastatic to pancreas ([place]) 2012  . 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 Metastasis to bone [facility]) 2014/2016    Patient Active Problem List   Diagnosis Date Noted  . Syncope 02/28/2020  . Orthostatic hypotension 02/28/2020  . Gastrointestinal bleeding, upper 08/28/2019  . Acute upper GI bleed 08/28/2019  . Acute esophagitis   . Gastritis and gastroduodenitis   . Black stool   . Metastasis from malignant tumor of kidney ([place]) 02/15/2018  . Symptomatic anemia   . Pulmonary embolus, left ([place])   . Depression 11/13/2015  . Benign essential HTN 11/13/2015  . Malnutrition of moderate degree 11/12/2015  . HCAP (healthcare-associated pneumonia) 11/11/2015  . Chest pain 11/11/2015  . Acute pulmonary embolism ([place]) 11/11/2015  . Metastasis to lung ([place]) 11/11/2015  . Right middle lobe, bilateral lower lobes  pneumonia  11/11/2015  . Acute respiratory failure with hypoxia ([place]) 11/11/2015  . Chemotherapy induced thrombocytopenia 11/11/2015  . Antineoplastic chemotherapy induced anemia 11/11/2015  . Hyponatremia 11/11/2015  . Cancer related pain 11/11/2015  . Transaminitis 11/11/2015  . Diabetes mellitus with diabetic neuropathy, with long-term current use of insulin ([place]) 11/11/2015  . Therapeutic opioid-induced constipation ([facility]) 11/11/2015  . Bone metastasis ([place]) 12/25/2014  . Polysubstance abuse-cocaine and THC. 04/25/2013  . Renal cancer ([place]) 08/12/2011    Past Surgical History:  Procedure Laterality Date  . BIOPSY  08/28/2019   Procedure: BIOPSY;  Surgeon: Thornton Park, MD;  Location: [facility];  Service: Gastroenterology;;  . COLONOSCOPY W/ POLYPECTOMY    . ESOPHAGOGASTRODUODENOSCOPY (EGD) WITH PROPOFOL N/A 08/28/2019   Procedure: ESOPHAGOGASTRODUODENOSCOPY (EGD) WITH PROPOFOL;  Surgeon: Thornton Park, MD;  Location: [facility];  Service: Gastroenterology;  Laterality: N/A;  . FEMUR IM NAIL Right 02/16/2018   Procedure: INTRAMEDULLARY (IM) NAIL RIGHT FEMORAL;  Surgeon: Paralee Cancel, MD;  Location: [facility] ORS;  Service: Orthopedics;  Laterality: Right;  . HAND SURGERY Right   . IR ANGIOGRAM EXTREMITY RIGHT  02/15/2018  . IR ANGIOGRAM SELECTIVE EACH ADDITIONAL VESSEL  02/15/2018  . IR ANGIOGRAM SELECTIVE EACH ADDITIONAL VESSEL  02/15/2018  . IR ANGIOGRAM SELECTIVE EACH ADDITIONAL VESSEL  02/15/2018  . IR ANGIOGRAM SELECTIVE EACH ADDITIONAL VESSEL  02/15/2018  . IR EMBO TUMOR ORGAN ISCHEMIA INFARCT INC GUIDE ROADMAPPING  02/15/2018  . IR EMBO TUMOR ORGAN ISCHEMIA INFARCT INC GUIDE ROADMAPPING  02/15/2018  . IR  US GUIDE VASC ACCESS LEFT  02/15/2018  . LUNG REMOVAL, PARTIAL Right 09/2008  . NEPHRECTOMY RADICAL     Left   . PAIN PUMP IMPLANTATION N/A 05/16/2015   Procedure: Intrathecal pain pump placement;  Surgeon: Clydell Hakim, MD;  Location: [facility];  Service: Neurosurgery;   Laterality: N/A;  Intrathecal pain pump placement       Family History  Problem Relation Age of Onset  . Hypertension Mother   . Diabetes Brother   . Irritable bowel syndrome Sister   . Cancer Maternal Aunt        ovarian  . Colon cancer Neg Hx     Social History   Tobacco Use  . Smoking status: Current Every Day Smoker    Packs/day: 1.00    Years: 30.00    Pack years: 30.00    Types: Cigarettes  . Smokeless tobacco: Never Used  Substance Use Topics  . Alcohol use: No  . Drug use: Yes    Types: Marijuana    Comment: occasional. Last used: last night.     Home Medications Prior to Admission medications   Medication Sig Start Date End Date Taking? Authorizing Provider  ALPRAZolam Duanne Moron) 1 MG tablet TAKE 1 TABLET BY MOUTH THREE TIMES A DAY AS NEEDED FOR ANXIETY 02/15/20   [name], MD  cabozantinib (CABOMETYX) 60 MG tablet Take 1 tablet (60 mg total) by mouth daily. With at least 8 ounces of water on an empty stomach. Do not eat for 2 hours before or 1 hour after. Swallow whole. 02/10/20   [name], MD  calcium carbonate (TUMS - DOSED IN MG ELEMENTAL CALCIUM) 500 MG chewable tablet Chew 1 tablet by mouth 3 (three) times daily as needed for indigestion or heartburn.     [provider]  DULoxetine (CYMBALTA) 60 MG capsule TAKE 1 CAPSULE (60 MG TOTAL) BY MOUTH 2 (TWO) TIMES DAILY. 12/26/19   [name], MD  ferrous sulfate 325 (65 FE) MG tablet Take 1 tablet (325 mg total) by mouth daily with breakfast. 08/31/19 10/30/19  Amin, Jeanella Flattery, MD  fluconazole (DIFLUCAN) 100 MG tablet Take 2 tablets now then take 1 daily until gone. 01/31/20   Willia Craze, NP  gabapentin (NEURONTIN) 300 MG capsule TAKE 1 CAPSULE BY MOUTH THREE TIMES A DAY 01/24/20   [name], MD  HYDROmorphone (DILAUDID) 8 MG tablet Take 1 tablet (8 mg total) by mouth every 4 (four) hours as needed for severe pain. 02/17/18   Danae Orleans, PA-C  hydroxypropyl methylcellulose /  hypromellose (ISOPTO TEARS / GONIOVISC) 2.5 % ophthalmic solution Place 1 drop into both eyes 3 (three) times daily as needed for dry eyes.    [provider]  loratadine (CLARITIN) 10 MG tablet Take 10 mg by mouth daily.    [provider]  megestrol (MEGACE) 40 MG/ML suspension TAKE 10 MLS (400 MG TOTAL) BY MOUTH 2 (TWO) TIMES DAILY. 07/18/19   [name], MD  rivaroxaban (XARELTO) 20 MG TABS tablet Take 1 tablet (20 mg total) by mouth daily with supper. 04/06/19   [name], MD  sucralfate (CARAFATE) 1 GM/10ML suspension Take 10 mLs (1 g total) by mouth every 6 (six) hours. 08/31/19 09/30/19  Amin, Jeanella Flattery, MD  tiZANidine (ZANAFLEX) 4 MG tablet Take 1 tablet (4 mg total) by mouth every 8 (eight) hours as needed for muscle spasms. 02/17/18   Danae Orleans, PA-C    Allergies  Ceftriaxone and Hydrocodone  Review of Systems   Review of Systems  Constitutional: Negative for chills and fever.  HENT: Negative for ear pain and sore throat.   Eyes: Negative for pain and visual disturbance.  Respiratory: Negative for cough and shortness of breath.   Cardiovascular: Negative for chest pain and palpitations.  Gastrointestinal: Positive for abdominal pain and blood in stool. Negative for vomiting.  Genitourinary: Negative for dysuria and hematuria.  Musculoskeletal: Negative for arthralgias and back pain.  Skin: Negative for color change and rash.  Neurological: Negative for seizures and syncope.  All other systems reviewed and are negative.   Physical Exam Updated Vital Signs BP (!) 129/91 (BP Location: Right Arm)   Pulse 79   Temp 98 F (36.7 C) (Oral)   Resp 16   Ht 6' 3"  (1.905 m)   Wt 75.5 kg   SpO2 97%   BMI 20.80 kg/m   Physical Exam Vitals and nursing note reviewed.  Constitutional:      Appearance: He is well-developed.  HENT:     Head: Normocephalic and atraumatic.  Eyes:     Comments: Pale conjunctiva  Cardiovascular:     Rate and  Rhythm: Normal rate and regular rhythm.     Heart sounds: No murmur.  Pulmonary:     Effort: Pulmonary effort is normal. No respiratory distress.     Breath sounds: Normal breath sounds.  Abdominal:     General: There is no distension.     Palpations: Abdomen is soft. There is no mass.     Tenderness: There is no abdominal tenderness.     Hernia: No hernia is present.  Genitourinary:    Comments: Black stool, no hemorrhoids or fissures Musculoskeletal:        General: No tenderness or deformity.     Cervical back: Neck supple.  Skin:    General: Skin is warm and dry.     Capillary Refill: Capillary refill takes less than 2 seconds.  Neurological:     General: No focal deficit present.     Mental Status: He is alert and oriented to person, place, and time.  Psychiatric:        Mood and Affect: Mood normal.        Behavior: Behavior normal.     ED Results / Procedures / Treatments   Labs (all labs ordered are listed, but only abnormal results are displayed) Labs Reviewed  POC OCCULT BLOOD, ED - Abnormal; Notable for the following components:      Result Value   Fecal Occult Bld POSITIVE (*)    All other components within normal limits  COMPREHENSIVE METABOLIC PANEL  CBC WITH DIFFERENTIAL/PLATELET  PROTIME-INR  TYPE AND SCREEN    EKG EKG Interpretation  Date/Time:  Tuesday February 28 2020 16:35:06 EST Ventricular Rate:  80 PR Interval:    QRS Duration: 104 QT Interval:  386 QTC Calculation: 446 R Axis:   68 Text Interpretation: Sinus rhythm Atrial premature complex Confirmed by Madalyn Rob [phone]) on 02/28/2020 4:44:42 PM   Radiology No results found.  Procedures .Critical Care Performed by: Lucrezia Starch, MD Authorized by: Lucrezia Starch, MD   Critical care provider statement:    Critical care time (minutes):  45   Critical care was necessary to treat or prevent imminent or life-threatening deterioration of the following conditions: GI bleed.    Critical care was time spent personally by me on the following activities:  Discussions with consultants, evaluation of  patient's response to treatment, examination of patient, ordering and performing treatments and interventions, ordering and review of laboratory studies, ordering and review of radiographic studies, pulse oximetry, re-evaluation of patient's condition, obtaining history from patient or surrogate and review of old charts   (including critical care time)  Medications Ordered in ED Medications  sodium chloride 0.9 % bolus 1,000 mL (has no administration in time range)  pantoprazole (PROTONIX) injection 40 mg (has no administration in time range)  ondansetron (ZOFRAN) injection 4 mg (has no administration in time range)    ED Course  I have reviewed the triage vital signs and the nursing notes.  Pertinent labs & imaging results that were available during my care of the patient were reviewed by me and considered in my medical decision making (see chart for details).  Clinical Course as of Feb 28 2043  Tue Feb 28, 2020  1614 Reviewed PCP note, no vitals filed yet - will go to bedside to assess   [RD]  1620 Completed initial assessment, well appearing but pale, no ongoing complaints, normal BP and HR; completed rectal - Christine chaperone   [RD]  [phone] Reviewed labs, will transfuse 2 units, c/s GI and admit hosp   [RD]  1837 Updated patient   [RD]  1905 D/w Py[place] - agrees with holding off on reversal for now given current hemodynamic stability, agrees with PPI, blood transfusion, his team will see tomorrow am; requests hosp admission   [RD]    Clinical Course User Index [RD] Lucrezia Starch, MD   MDM Rules/Calculators/A&P                      54 year old male who presented to ER with complaints of near syncope, lightheadedness and black stool.  On Xarelto for PE.  Prior history of GI bleed thought to be secondary to gastritis.  On exam here he is well-appearing with  normal vital signs.  No ongoing symptoms.  Hemoccult positive, black stool.  Hemoglobin dropped to 5.3.  Ordered 2 units of blood, gave Protonix, consulted GI.  Agree with current management, no further recommendations.  Team will see tomorrow AM.  Discussed with hospitalist who will admit patient.  Final Clinical Impression(s) / ED Diagnoses Final diagnoses:  Acute GI bleeding  Symptomatic anemia  Acute blood loss anemia    Rx / DC Orders ED Discharge Orders    None       Lucrezia Starch, MD 02/28/20 2046

## 2020-02-29 ENCOUNTER — Encounter (HOSPITAL_COMMUNITY): Payer: Self-pay | Admitting: Internal Medicine

## 2020-02-29 DIAGNOSIS — C7889 Secondary malignant neoplasm of other digestive organs: Secondary | ICD-10-CM | POA: Diagnosis present

## 2020-02-29 DIAGNOSIS — F17210 Nicotine dependence, cigarettes, uncomplicated: Secondary | ICD-10-CM | POA: Diagnosis present

## 2020-02-29 DIAGNOSIS — K648 Other hemorrhoids: Secondary | ICD-10-CM | POA: Diagnosis present

## 2020-02-29 DIAGNOSIS — K529 Noninfective gastroenteritis and colitis, unspecified: Secondary | ICD-10-CM | POA: Diagnosis present

## 2020-02-29 DIAGNOSIS — K319 Disease of stomach and duodenum, unspecified: Secondary | ICD-10-CM | POA: Diagnosis present

## 2020-02-29 DIAGNOSIS — K922 Gastrointestinal hemorrhage, unspecified: Secondary | ICD-10-CM

## 2020-02-29 DIAGNOSIS — R195 Other fecal abnormalities: Secondary | ICD-10-CM

## 2020-02-29 DIAGNOSIS — K449 Diaphragmatic hernia without obstruction or gangrene: Secondary | ICD-10-CM | POA: Diagnosis present

## 2020-02-29 DIAGNOSIS — D689 Coagulation defect, unspecified: Secondary | ICD-10-CM | POA: Diagnosis present

## 2020-02-29 DIAGNOSIS — E869 Volume depletion, unspecified: Secondary | ICD-10-CM | POA: Diagnosis present

## 2020-02-29 DIAGNOSIS — D649 Anemia, unspecified: Secondary | ICD-10-CM | POA: Diagnosis not present

## 2020-02-29 DIAGNOSIS — K633 Ulcer of intestine: Secondary | ICD-10-CM | POA: Diagnosis present

## 2020-02-29 DIAGNOSIS — Z905 Acquired absence of kidney: Secondary | ICD-10-CM | POA: Diagnosis not present

## 2020-02-29 DIAGNOSIS — Z20822 Contact with and (suspected) exposure to covid-19: Secondary | ICD-10-CM | POA: Diagnosis present

## 2020-02-29 DIAGNOSIS — K2100 Gastro-esophageal reflux disease with esophagitis, without bleeding: Secondary | ICD-10-CM | POA: Diagnosis present

## 2020-02-29 DIAGNOSIS — K2971 Gastritis, unspecified, with bleeding: Secondary | ICD-10-CM | POA: Diagnosis present

## 2020-02-29 DIAGNOSIS — C7951 Secondary malignant neoplasm of bone: Secondary | ICD-10-CM | POA: Diagnosis present

## 2020-02-29 DIAGNOSIS — T451X5A Adverse effect of antineoplastic and immunosuppressive drugs, initial encounter: Secondary | ICD-10-CM | POA: Diagnosis present

## 2020-02-29 DIAGNOSIS — N179 Acute kidney failure, unspecified: Secondary | ICD-10-CM | POA: Diagnosis present

## 2020-02-29 DIAGNOSIS — K921 Melena: Secondary | ICD-10-CM | POA: Diagnosis not present

## 2020-02-29 DIAGNOSIS — R55 Syncope and collapse: Secondary | ICD-10-CM | POA: Diagnosis present

## 2020-02-29 DIAGNOSIS — Z86711 Personal history of pulmonary embolism: Secondary | ICD-10-CM | POA: Diagnosis not present

## 2020-02-29 DIAGNOSIS — K259 Gastric ulcer, unspecified as acute or chronic, without hemorrhage or perforation: Secondary | ICD-10-CM | POA: Diagnosis present

## 2020-02-29 DIAGNOSIS — D62 Acute posthemorrhagic anemia: Secondary | ICD-10-CM

## 2020-02-29 DIAGNOSIS — C7800 Secondary malignant neoplasm of unspecified lung: Secondary | ICD-10-CM | POA: Diagnosis present

## 2020-02-29 DIAGNOSIS — G8929 Other chronic pain: Secondary | ICD-10-CM | POA: Diagnosis present

## 2020-02-29 DIAGNOSIS — E119 Type 2 diabetes mellitus without complications: Secondary | ICD-10-CM | POA: Diagnosis present

## 2020-02-29 DIAGNOSIS — C642 Malignant neoplasm of left kidney, except renal pelvis: Secondary | ICD-10-CM | POA: Diagnosis present

## 2020-02-29 LAB — CBC
HCT: 22.6 % — ABNORMAL LOW (ref 39.0–52.0)
Hemoglobin: 6.8 g/dL — CL (ref 13.0–17.0)
MCH: 30.2 pg (ref 26.0–34.0)
MCHC: 30.1 g/dL (ref 30.0–36.0)
MCV: 100.4 fL — ABNORMAL HIGH (ref 80.0–100.0)
Platelets: 98 10*3/uL — ABNORMAL LOW (ref 150–400)
RBC: 2.25 MIL/uL — ABNORMAL LOW (ref 4.22–5.81)
RDW: 22.9 % — ABNORMAL HIGH (ref 11.5–15.5)
WBC: 5.6 10*3/uL (ref 4.0–10.5)
nRBC: 0 % (ref 0.0–0.2)

## 2020-02-29 LAB — HEMATOCRIT
HCT: 24 % — ABNORMAL LOW (ref 39.0–52.0)
HCT: 26.2 % — ABNORMAL LOW (ref 39.0–52.0)

## 2020-02-29 LAB — BASIC METABOLIC PANEL
Anion gap: 5 (ref 5–15)
BUN: 29 mg/dL — ABNORMAL HIGH (ref 6–20)
CO2: 23 mmol/L (ref 22–32)
Calcium: 7.1 mg/dL — ABNORMAL LOW (ref 8.9–10.3)
Chloride: 110 mmol/L (ref 98–111)
Creatinine, Ser: 1.36 mg/dL — ABNORMAL HIGH (ref 0.61–1.24)
GFR calc Af Amer: >60 mL/min (ref >60)
GFR calc non Af Amer: 59 mL/min — ABNORMAL LOW (ref >60)
Glucose, Bld: 92 mg/dL (ref 70–99)
Potassium: 4.8 mmol/L (ref 3.5–5.1)
Sodium: 138 mmol/L (ref 135–145)

## 2020-02-29 LAB — HEMOGLOBIN
Hemoglobin: 7.2 g/dL — ABNORMAL LOW (ref 13.0–17.0)
Hemoglobin: 7.8 g/dL — ABNORMAL LOW (ref 13.0–17.0)

## 2020-02-29 LAB — MAGNESIUM: Magnesium: 1.4 mg/dL — ABNORMAL LOW (ref 1.7–2.4)

## 2020-02-29 LAB — GLUCOSE, CAPILLARY
Glucose-Capillary: 63 mg/dL — ABNORMAL LOW (ref 70–99)
Glucose-Capillary: 93 mg/dL (ref 70–99)
Glucose-Capillary: 78 mg/dL (ref 70–99)

## 2020-02-29 LAB — PHOSPHORUS: Phosphorus: 2.9 mg/dL (ref 2.5–4.6)

## 2020-02-29 LAB — PROTIME-INR
INR: 1.3 — ABNORMAL HIGH (ref 0.8–1.2)
Prothrombin Time: 15.7 seconds — ABNORMAL HIGH (ref 11.4–15.2)

## 2020-02-29 LAB — CBG MONITORING, ED: Glucose-Capillary: 84 mg/dL (ref 70–99)

## 2020-02-29 LAB — HEMOGLOBIN A1C
Hgb A1c MFr Bld: 4.9 % (ref 4.8–5.6)
Mean Plasma Glucose: 93.93 mg/dL

## 2020-02-29 MED ORDER — PEG-KCL-NACL-NASULF-NA ASC-C 100 G PO SOLR
0.5000 | Freq: Once | ORAL | Status: AC
  Administered 2020-03-01: 100 g via ORAL

## 2020-02-29 MED ORDER — INSULIN ASPART 100 UNIT/ML ~~LOC~~ SOLN
0.0000 [IU] | Freq: Three times a day (TID) | SUBCUTANEOUS | Status: DC
  Filled 2020-02-29: qty 0.06

## 2020-02-29 MED ORDER — PEG-KCL-NACL-NASULF-NA ASC-C 100 G PO SOLR: 1.0000 | Freq: Once | ORAL | Status: DC

## 2020-02-29 MED ORDER — PANTOPRAZOLE SODIUM 40 MG IV SOLR
40.0000 mg | Freq: Two times a day (BID) | INTRAVENOUS | Status: DC
  Administered 2020-02-29 – 2020-03-05 (×10): 40 mg via INTRAVENOUS
  Filled 2020-02-29 (×10): qty 40

## 2020-02-29 MED ORDER — LORATADINE 10 MG PO TABS: 10.0000 mg | ORAL_TABLET | Freq: Every day | ORAL | Status: DC | PRN

## 2020-02-29 MED ORDER — DULOXETINE HCL 60 MG PO CPEP
60.0000 mg | ORAL_CAPSULE | Freq: Two times a day (BID) | ORAL | Status: DC
  Administered 2020-02-29 – 2020-03-05 (×9): 60 mg via ORAL
  Filled 2020-02-29 (×7): qty 1
  Filled 2020-02-29: qty 2
  Filled 2020-02-29: qty 1
  Filled 2020-02-29: qty 2

## 2020-02-29 MED ORDER — MAGNESIUM SULFATE 2 GM/50ML IV SOLN
2.0000 g | Freq: Once | INTRAVENOUS | Status: AC
  Administered 2020-02-29: 2 g via INTRAVENOUS
  Filled 2020-02-29: qty 50

## 2020-02-29 MED ORDER — PEG-KCL-NACL-NASULF-NA ASC-C 100 G PO SOLR
0.5000 | Freq: Once | ORAL | Status: AC
  Administered 2020-02-29: 100 g via ORAL
  Filled 2020-02-29: qty 1

## 2020-02-29 MED ORDER — ALPRAZOLAM 0.5 MG PO TABS: 0.5000 mg | ORAL_TABLET | Freq: Three times a day (TID) | ORAL | Status: DC | PRN

## 2020-02-29 MED ORDER — SUCRALFATE 1 GM/10ML PO SUSP
1.0000 g | Freq: Four times a day (QID) | ORAL | Status: DC
  Administered 2020-02-29 – 2020-03-05 (×20): 1 g via ORAL
  Filled 2020-02-29 (×19): qty 10

## 2020-02-29 MED ORDER — TIZANIDINE HCL 4 MG PO TABS: 4.0000 mg | ORAL_TABLET | Freq: Three times a day (TID) | ORAL | Status: DC | PRN

## 2020-02-29 MED ORDER — CALCIUM CARBONATE ANTACID 500 MG PO CHEW: 1.0000 | CHEWABLE_TABLET | Freq: Three times a day (TID) | ORAL | Status: DC | PRN

## 2020-02-29 MED ORDER — POLYVINYL ALCOHOL 1.4 % OP SOLN
1.0000 [drp] | Freq: Three times a day (TID) | OPHTHALMIC | Status: DC | PRN
  Filled 2020-02-29: qty 15

## 2020-02-29 MED ORDER — FERROUS SULFATE 325 (65 FE) MG PO TABS
325.0000 mg | ORAL_TABLET | Freq: Every day | ORAL | Status: DC
  Administered 2020-02-29 – 2020-03-05 (×5): 325 mg via ORAL
  Filled 2020-02-29 (×6): qty 1

## 2020-02-29 MED ORDER — GABAPENTIN 300 MG PO CAPS
300.0000 mg | ORAL_CAPSULE | Freq: Three times a day (TID) | ORAL | Status: DC
  Administered 2020-02-29 – 2020-03-05 (×14): 300 mg via ORAL
  Filled 2020-02-29 (×14): qty 1

## 2020-02-29 MED ORDER — HYDROMORPHONE HCL 2 MG PO TABS
8.0000 mg | ORAL_TABLET | ORAL | Status: DC | PRN
  Administered 2020-03-01 – 2020-03-05 (×12): 8 mg via ORAL
  Filled 2020-02-29 (×12): qty 4

## 2020-02-29 NOTE — Progress Notes (Signed)
PROGRESS NOTE    KORT STETTLER  VZD:[id number] DOB: 1966/06/17 DOA: 02/28/2020 PCP: [name], MD    Brief Narrative:54 year old Caucasian male with past medical history significant for prior GI bleed, colonic polyps, pulmonary embolism on Xarelto for a few years, gastritis, diabetes mellitus, metastatic left renal cancer with metastasis to the bone, lung and pancreas.  Last GI bleed was in September 2020, patient had an EGD that revealed grade C esophagitis and nonbleeding erosive gastropathy.  Patient experienced near syncopal episode while visiting his primary care provider earlier today.  Is not clear whether patient actually passed out.  Patient was advised to come to the hospital for further assessment and management.  On presentation to the hospital, patient's hemoglobin was found to be 5.3 g/dL, down from 8.3 g/dL.  Platelet count is 113, INR is 2.6 with positive fecal occult blood.  No headache, no neck pain, no chest pain, no GI symptoms and no urinary symptoms.  Hospitalist team has been asked to admit patient for further assessment and management.  ED Course: On presentation to the hospital, this revealed temperature of 97.4, blood pressure of 80-140/50-79 mmHg, heart rate of 72 bpm, respiratory rate of 14 and O2 sat of 99%.  Work-up done so far is as documented above.  Patient will be transfused with 2 units of packed red blood cells.  ER provider has consulted the GI consultant on-call, Dr. Hilarie Fredrickson.  Assessment & Plan:   Active Problems:   Symptomatic anemia   GI bleed  #1 GI bleed likely upper-plan for EGD and colonoscopy 03/01/2020 continue Protonix.  N.p.o. after midnight.  #2 acute on chronic anemia hemoglobin 5.3 upon admission to the ER status post 2 units of packed RBC with hemoglobin up to 7.3.  #3 history of pulmonary embolism on Xarelto continue to hold  #4 history of renal cell carcinoma metastatic status post nephrectomy radiotherapy followed by Dr. [name]  #5  chronic pain has a pain pump in place left lower quadrant  #6 chronic diarrhea question secondary to recent antibiotics versus chemotherapy  #7 elevated INR unclear reason on Xarelto which is on hold but that should not affect his INR.  Liver functions otherwise okay.  #8 hypomagnesemia replete recheck labs in a.m.    Estimated body mass index is 20.8 kg/m as calculated from the following:   Height as of this encounter: 6\' 3"  (1.905 m).   Weight as of this encounter: 75.5 kg.  DVT prophylaxis: SCD  code Status: Full code Family Communication: None at bedside Disposition Plan: Patient came from home plan will be to discharge home once the GI bleeding is resolved and work-up is completed. Consultants:   GI  Procedures: None Antimicrobials: None  Subjective: He is resting in bed reports he is very hungry anxious to eat something  Objective: Vitals:   02/29/20 0900 02/29/20 0930 02/29/20 1100 02/29/20 1427  BP: 120/85 130/86 (!) 136/92 127/86  Pulse: 70 68 68 71  Resp: 12 15 12 16   Temp:      TempSrc:      SpO2: 98% 98% 96% 95%  Weight:      Height:        Intake/Output Summary (Last 24 hours) at 02/29/2020 1610 Last data filed at 02/29/2020 0122 Gross per 24 hour  Intake 1945 ml  Output --  Net 1945 ml   Filed Weights   02/28/20 1628  Weight: 75.5 kg    Examination:  General exam: Appears calm and comfortable  Respiratory  system: Clear to auscultation. Respiratory effort normal. Cardiovascular system: S1 & S2 heard, RRR. No JVD, murmurs, rubs, gallops or clicks. No pedal edema. Gastrointestinal system: Abdomen is nondistended, soft and nontender. No organomegaly or masses felt. Normal bowel sounds heard.  Pain pump left lower quadrant Central nervous system: Alert and oriented. No focal neurological deficits. Extremities: Symmetric 5 x 5 power. Skin: No rashes, lesions or ulcers Psychiatry: Judgement and insight appear normal. Mood & affect appropriate.      Data Reviewed: I have personally reviewed following labs and imaging studies  CBC: Recent Labs  Lab 02/23/20 1229 02/28/20 1636 02/29/20 0734 02/29/20 1017  WBC 5.0 5.4 5.6  --   NEUTROABS 3.6 4.1  --   --   HGB 8.3* 5.3* 6.8* 7.2*  HCT 28.3* 18.5* 22.6* 24.0*  MCV 106.0* 109.5* 100.4*  --   PLT 105* 113* 98*  --    Basic Metabolic Panel: Recent Labs  Lab 02/23/20 1229 02/28/20 1636 02/29/20 0734  NA 137 141 138  K 4.6 4.7 4.8  CL 109 109 110  CO2 25 25 23   GLUCOSE 112* 101* 92  BUN 16 28* 29*  CREATININE 1.22 1.52* 1.36*  CALCIUM 7.9* 7.4* 7.1*  MG  --   --  1.4*  PHOS  --   --  2.9   GFR: Estimated Creatinine Clearance: 67.9 mL/min (A) (by C-G formula based on SCr of 1.36 mg/dL (H)). Liver Function Tests: Recent Labs  Lab 02/23/20 1229 02/28/20 1636  AST 17 17  ALT 20 22  ALKPHOS 96 69  BILITOT <0.2* 0.4  PROT 6.4* 5.3*  ALBUMIN 2.8* 2.4*   No results for input(s): LIPASE, AMYLASE in the last 168 hours. No results for input(s): AMMONIA in the last 168 hours. Coagulation Profile: Recent Labs  Lab 02/28/20 1636  INR 2.6*   Cardiac Enzymes: No results for input(s): CKTOTAL, CKMB, CKMBINDEX, TROPONINI in the last 168 hours. BNP (last 3 results) No results for input(s): PROBNP in the last 8760 hours. HbA1C: Recent Labs    02/29/20 0734  HGBA1C 4.9   CBG: Recent Labs  Lab 02/29/20 1152  GLUCAP 84   Lipid Profile: No results for input(s): CHOL, HDL, LDLCALC, TRIG, CHOLHDL, LDLDIRECT in the last 72 hours. Thyroid Function Tests: No results for input(s): TSH, T4TOTAL, FREET4, T3FREE, THYROIDAB in the last 72 hours. Anemia Panel: No results for input(s): VITAMINB12, FOLATE, FERRITIN, TIBC, IRON, RETICCTPCT in the last 72 hours. Sepsis Labs: No results for input(s): PROCALCITON, LATICACIDVEN in the last 168 hours.  Recent Results (from the past 240 hour(s))  Respiratory Panel by RT PCR (Flu A&B, Covid) - Nasopharyngeal Swab     Status:  None   Collection Time: 02/28/20  7:51 PM   Specimen: Nasopharyngeal Swab  Result Value Ref Range Status   SARS Coronavirus 2 by RT PCR NEGATIVE NEGATIVE Final    Comment: (NOTE) SARS-CoV-2 target nucleic acids are NOT DETECTED. The SARS-CoV-2 RNA is generally detectable in upper respiratoy specimens during the acute phase of infection. The lowest concentration of SARS-CoV-2 viral copies this assay can detect is 131 copies/mL. A negative result does not preclude SARS-Cov-2 infection and should not be used as the sole basis for treatment or other patient management decisions. A negative result may occur with  improper specimen collection/handling, submission of specimen other than nasopharyngeal swab, presence of viral mutation(s) within the areas targeted by this assay, and inadequate number of viral copies (<131 copies/mL). A negative result must be  combined with clinical observations, patient history, and epidemiological information. The expected result is Negative. Fact Sheet for Patients:  PinkCheek.be Fact Sheet for Healthcare Providers:  GravelBags.it This test is not yet ap proved or cleared by the [place] FDA and  has been authorized for detection and/or diagnosis of SARS-CoV-2 by FDA under an Emergency Use Authorization (EUA). This EUA will remain  in effect (meaning this test can be used) for the duration of the COVID-19 declaration under Section 564(b)(1) of the Act, 21 U.S.C. section 360bbb-3(b)(1), unless the authorization is terminated or revoked sooner.    Influenza A by PCR NEGATIVE NEGATIVE Final   Influenza B by PCR NEGATIVE NEGATIVE Final    Comment: (NOTE) The Xpert Xpress SARS-CoV-2/FLU/RSV assay is intended as an aid in  the diagnosis of influenza from Nasopharyngeal swab specimens and  should not be used as a sole basis for treatment. Nasal washings and  aspirates are unacceptable for Xpert  Xpress SARS-CoV-2/FLU/RSV  testing. Fact Sheet for Patients: PinkCheek.be Fact Sheet for Healthcare Providers: GravelBags.it This test is not yet approved or cleared by the [place] FDA and  has been authorized for detection and/or diagnosis of SARS-CoV-2 by  FDA under an Emergency Use Authorization (EUA). This EUA will remain  in effect (meaning this test can be used) for the duration of the  Covid-19 declaration under Section 564(b)(1) of the Act, 21  U.S.C. section 360bbb-3(b)(1), unless the authorization is  terminated or revoked. Performed at [facility], [place] [address] [place], [place] [address]          Radiology Studies: No results found.      Scheduled Meds: . DULoxetine  60 mg Oral BID  . ferrous sulfate  325 mg Oral Q breakfast  . gabapentin  300 mg Oral TID  . insulin aspart  0-6 Units Subcutaneous TID WC  . pantoprazole (PROTONIX) IV  40 mg Intravenous Q12H  . sucralfate  1 g Oral Q6H   Continuous Infusions:   LOS: 0 days      Georgette Shell, MD  02/29/2020, 4:10 PM

## 2020-02-29 NOTE — ED Notes (Signed)
Second unit of blood has completed infusing. Will wait 2 hours before drawing ordered labs.

## 2020-02-29 NOTE — Consult Note (Addendum)
Referring Provider:  EDP Primary Care Physician:  [name], MD Primary Gastroenterologist:  Dr. Ardis Hughs  Reason for Consultation:  GI bleed  HPI: Keith [name] is a 54 y.o. male with metastatic renal cell carcinoma on ongoing p.o. chemotherapy regimen.  Had an office visit with me yesterday, but upon presentation he complained of being extremely dizzy, was hypotensive, had a syncopal type episode while laying on the exam table and then began vomiting.  EMS was called.  He was brought to the emergency department where he was found to have Hgb down to 5.3 grams, was 5.3 grams just 5 days ago.  Received 2 units PRBC's.  Heme positive.  Repeat hemoglobin after 2 units is 7.2 g.  Is on Xarelto at home with last dose March 9 morning.  Had GI bleed in September 2020 and underwent EGD that showed the following:  - LA Grade C esophagitis. Biopsied. The likely source of bleeding in the setting of Xarelto. - Non-bleeding erosive gastropathy. Biopsied. - Hiatal hernia. - Normal examined duodenum. - No evidence for recent or active bleeding.  Biopsy subsequently showed H. pylori so he was treated.  Follow-up H. pylori breath test was still positive, then he was treated again in January 2021.  After that treatment he had a stool antigen performed sometime in February that then was negative.  Colonoscopy 11/2008 by Dr. Lajoyce Corners showed medium sized internal hemorrhoids.  He tells me that he has ongoing diarrhea that he believes secondary to his chemotherapy.  He says that obviously with taking all the antibiotics for the H. pylori the diarrhea had worsened as well.  He has not had a bowel movement since he has been here at the hospital.  He reported one large very dark stool yesterday prior to his office visit, but other than that his stools he said had not been black in color.  He denies any sign of red bleeding.  He has been vomiting intermittently as well since September.  He has not seen any blood in his  emesis.  Platelets are chronically low question secondary to chemotherapy.  They are 98 this morning.  INR was high at 2.6 upon presentation yesterday.  This morning he says that he feels much better after receiving the 2 units of packed red blood cells.  Past Medical History:  Diagnosis Date  . Diabetes mellitus without complication ([place])    diet controlled only  . left renal ca d'd 2008  . Malignant neoplasm metastatic to pancreas ([place]) 2012  . 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 BIOPSY  08/28/2019   Procedure: BIOPSY;  Surgeon: Thornton Park, MD;  Location: [facility];  Service: Gastroenterology;;  . COLONOSCOPY W/ POLYPECTOMY    . ESOPHAGOGASTRODUODENOSCOPY (EGD) WITH PROPOFOL N/A 08/28/2019   Procedure: ESOPHAGOGASTRODUODENOSCOPY (EGD) WITH PROPOFOL;  Surgeon: Thornton Park, MD;  Location: [facility];  Service: Gastroenterology;  Laterality: N/A;  . FEMUR IM NAIL Right 02/16/2018   Procedure: INTRAMEDULLARY (IM) NAIL RIGHT FEMORAL;  Surgeon: Paralee Cancel, MD;  Location: [facility] ORS;  Service: Orthopedics;  Laterality: Right;  . HAND SURGERY Right   . IR ANGIOGRAM EXTREMITY RIGHT  02/15/2018  . IR ANGIOGRAM SELECTIVE EACH ADDITIONAL VESSEL  02/15/2018  . IR ANGIOGRAM SELECTIVE EACH ADDITIONAL VESSEL  02/15/2018  . IR ANGIOGRAM SELECTIVE EACH ADDITIONAL VESSEL  02/15/2018  . IR ANGIOGRAM SELECTIVE EACH ADDITIONAL VESSEL  02/15/2018  . IR  EMBO TUMOR ORGAN ISCHEMIA INFARCT INC GUIDE ROADMAPPING  02/15/2018  . IR EMBO TUMOR ORGAN ISCHEMIA INFARCT INC GUIDE ROADMAPPING  02/15/2018  . IR US GUIDE VASC ACCESS LEFT  02/15/2018  . LUNG REMOVAL, PARTIAL Right 09/2008  . NEPHRECTOMY RADICAL     Left   . PAIN PUMP IMPLANTATION N/A 05/16/2015   Procedure: Intrathecal pain pump placement;  Surgeon: Clydell Hakim, MD;  Location: [place] [facility] ORS;  Service: Neurosurgery;  Laterality: N/A;  Intrathecal pain pump placement     Prior to Admission medications   Medication Sig Start Date End Date Taking? Authorizing Provider  ALPRAZolam Duanne Moron) 1 MG tablet TAKE 1 TABLET BY MOUTH THREE TIMES A DAY AS NEEDED FOR ANXIETY 02/15/20  Yes Shadad, Mathis Dad, MD  cabozantinib (CABOMETYX) 60 MG tablet Take 1 tablet (60 mg total) by mouth daily. With at least 8 ounces of water on an empty stomach. Do not eat for 2 hours before or 1 hour after. Swallow whole. 02/10/20  Yes [name], MD  calcium carbonate (TUMS - DOSED IN MG ELEMENTAL CALCIUM) 500 MG chewable tablet Chew 1 tablet by mouth 3 (three) times daily as needed for indigestion or heartburn.    Yes [provider]  DULoxetine (CYMBALTA) 60 MG capsule TAKE 1 CAPSULE (60 MG TOTAL) BY MOUTH 2 (TWO) TIMES DAILY. 12/26/19  Yes [name], MD  gabapentin (NEURONTIN) 300 MG capsule TAKE 1 CAPSULE BY MOUTH THREE TIMES A DAY Patient taking differently: Take 300 mg by mouth 3 (three) times daily.  01/24/20  Yes [name], MD  HYDROmorphone (DILAUDID) 8 MG tablet Take 1 tablet (8 mg total) by mouth every 4 (four) hours as needed for severe pain. 02/17/18  Yes Babish, Rodman Key, PA-C  hydroxypropyl methylcellulose / hypromellose (ISOPTO TEARS / GONIOVISC) 2.5 % ophthalmic solution Place 1 drop into both eyes 3 (three) times daily as needed for dry eyes.   Yes [provider]  loratadine (CLARITIN) 10 MG tablet Take 10 mg by mouth daily as needed for allergies.    Yes [provider]  PAIN MANAGEMENT INTRATHECAL, IT, PUMP 1 each by Intrathecal route continuous. Intrathecal (IT) medication:   Morphine.   Yes [provider]  rivaroxaban (XARELTO) 20 MG TABS tablet Take 1 tablet (20 mg total) by mouth daily with supper. 04/06/19  Yes [name], MD  tiZANidine (ZANAFLEX) 4 MG tablet Take 1 tablet (4 mg total) by mouth every 8 (eight) hours as needed for muscle spasms. 02/17/18  Yes Danae Orleans, PA-C  ferrous sulfate 325 (65 FE) MG tablet Take  1 tablet (325 mg total) by mouth daily with breakfast. 08/31/19 10/30/19  Amin, Jeanella Flattery, MD  fluconazole (DIFLUCAN) 100 MG tablet Take 2 tablets now then take 1 daily until gone. Patient not taking: Reported on 02/28/2020 01/31/20   Willia Craze, NP  megestrol (MEGACE) 40 MG/ML suspension TAKE 10 MLS (400 MG TOTAL) BY MOUTH 2 (TWO) TIMES DAILY. Patient not taking: Reported on 02/28/2020 07/18/19   [name], MD  sucralfate (CARAFATE) 1 GM/10ML suspension Take 10 mLs (1 g total) by mouth every 6 (six) hours. 08/31/19 09/30/19  Damita Lack, MD    Current Facility-Administered Medications  Medication Dose Route Frequency Provider Last Rate Last Admin  . ALPRAZolam Duanne Moron) tablet 0.5 mg  0.5 mg Oral TID PRN Dana Allan I, MD      . calcium carbonate (TUMS - dosed in mg elemental calcium) chewable tablet 200 mg  of elemental calcium  1 tablet Oral TID PRN Dana Allan I, MD      . DULoxetine (CYMBALTA) DR capsule 60 mg  60 mg Oral BID Dana Allan I, MD   60 mg at 02/29/20 0301  . ferrous sulfate tablet 325 mg  325 mg Oral Q breakfast Dana Allan I, MD   325 mg at 02/29/20 0810  . gabapentin (NEURONTIN) capsule 300 mg  300 mg Oral TID Dana Allan I, MD      . HYDROmorphone (DILAUDID) tablet 8 mg  8 mg Oral Q4H PRN Dana Allan I, MD      . insulin aspart (novoLOG) injection 0-6 Units  0-6 Units Subcutaneous TID WC Dana Allan I, MD      . loratadine (CLARITIN) tablet 10 mg  10 mg Oral Daily PRN Dana Allan I, MD      . pantoprazole (PROTONIX) injection 40 mg  40 mg Intravenous Q12H Dana Allan I, MD   40 mg at 02/29/20 0304  . polyvinyl alcohol (LIQUIFILM TEARS) 1.4 % ophthalmic solution 1 drop  1 drop Both Eyes TID PRN Dana Allan I, MD      . sucralfate (CARAFATE) 1 GM/10ML suspension 1 g  1 g Oral Q6H Dana Allan I, MD   1 g at 02/29/20 0303  . tiZANidine (ZANAFLEX) tablet 4 mg  4 mg Oral Q8H PRN Bonnell Public, MD        Current Outpatient Medications  Medication Sig Dispense Refill  . ALPRAZolam (XANAX) 1 MG tablet TAKE 1 TABLET BY MOUTH THREE TIMES A DAY AS NEEDED FOR ANXIETY 90 tablet 0  . cabozantinib (CABOMETYX) 60 MG tablet Take 1 tablet (60 mg total) by mouth daily. With at least 8 ounces of water on an empty stomach. Do not eat for 2 hours before or 1 hour after. Swallow whole. 30 tablet 0  . calcium carbonate (TUMS - DOSED IN MG ELEMENTAL CALCIUM) 500 MG chewable tablet Chew 1 tablet by mouth 3 (three) times daily as needed for indigestion or heartburn.     . DULoxetine (CYMBALTA) 60 MG capsule TAKE 1 CAPSULE (60 MG TOTAL) BY MOUTH 2 (TWO) TIMES DAILY. 180 capsule 0  . gabapentin (NEURONTIN) 300 MG capsule TAKE 1 CAPSULE BY MOUTH THREE TIMES A DAY (Patient taking differently: Take 300 mg by mouth 3 (three) times daily. ) 270 capsule 0  . HYDROmorphone (DILAUDID) 8 MG tablet Take 1 tablet (8 mg total) by mouth every 4 (four) hours as needed for severe pain. 30 tablet 0  . hydroxypropyl methylcellulose / hypromellose (ISOPTO TEARS / GONIOVISC) 2.5 % ophthalmic solution Place 1 drop into both eyes 3 (three) times daily as needed for dry eyes.    [name] loratadine (CLARITIN) 10 MG tablet Take 10 mg by mouth daily as needed for allergies.     [name] PAIN MANAGEMENT INTRATHECAL, IT, PUMP 1 each by Intrathecal route continuous. Intrathecal (IT) medication:   Morphine.    . rivaroxaban (XARELTO) 20 MG TABS tablet Take 1 tablet (20 mg total) by mouth daily with supper. 90 tablet 3  . tiZANidine (ZANAFLEX) 4 MG tablet Take 1 tablet (4 mg total) by mouth every 8 (eight) hours as needed for muscle spasms. 30 tablet 0  . ferrous sulfate 325 (65 FE) MG tablet Take 1 tablet (325 mg total) by mouth daily with breakfast. 60 tablet 0  . fluconazole (DIFLUCAN) 100 MG tablet Take 2 tablets now then take 1 daily until gone. (Patient not taking:  Reported on 02/28/2020) 8 tablet 0  . megestrol (MEGACE) 40 MG/ML suspension TAKE 10 MLS (400  MG TOTAL) BY MOUTH 2 (TWO) TIMES DAILY. (Patient not taking: Reported on 02/28/2020) 240 mL 0  . sucralfate (CARAFATE) 1 GM/10ML suspension Take 10 mLs (1 g total) by mouth every 6 (six) hours. 1200 mL 0    Allergies as of 02/28/2020 - Review Complete 02/28/2020  Allergen Reaction Noted  . Ceftriaxone Hives 08/06/2011  . Hydrocodone Swelling 08/06/2011    Family History  Problem Relation Age of Onset  . Hypertension Mother   . Diabetes Brother   . Irritable bowel syndrome Sister   . Cancer Maternal Aunt        ovarian  . Colon cancer Neg Hx     Social History   Socioeconomic History  . Marital status: Married    Spouse name: Not on file  . Number of children: 2  . Years of education: Not on file  . Highest education level: Not on file  Occupational History  . Occupation: Clinical biochemist   Tobacco Use  . Smoking status: Current Every Day Smoker    Packs/day: 1.00    Years: 30.00    Pack years: 30.00    Types: Cigarettes  . Smokeless tobacco: Never Used  Substance and Sexual Activity  . Alcohol use: No  . Drug use: Yes    Types: Marijuana    Comment: occasional. Last used: last night.   . Sexual activity: Not Currently  Other Topics Concern  . Not on file  Social History Narrative   6 caffeine drinks daily    08-11-18  Unable to ask abuse questions wife with him today.   Social Determinants of Health   Financial Resource Strain:   . Difficulty of Paying Living Expenses: Not on file  Food Insecurity:   . Worried About Charity fundraiser in the Last Year: Not on file  . Ran Out of Food in the Last Year: Not on file  Transportation Needs:   . Lack of Transportation (Medical): Not on file  . Lack of Transportation (Non-Medical): Not on file  Physical Activity:   . Days of Exercise per Week: Not on file  . Minutes of Exercise per Session: Not on file  Stress:   . Feeling of Stress : Not on file  Social Connections:   . Frequency of Communication with Friends and  Family: Not on file  . Frequency of Social Gatherings with Friends and Family: Not on file  . Attends Religious Services: Not on file  . Active Member of Clubs or Organizations: Not on file  . Attends Archivist Meetings: Not on file  . Marital Status: Not on file  Intimate Partner Violence:   . Fear of Current or Ex-Partner: Not on file  . Emotionally Abused: Not on file  . Physically Abused: Not on file  . Sexually Abused: Not on file    Review of Systems: ROS is O/W negative except as mentioned in HPI.  Physical Exam: Vital signs in last 24 hours: Temp:  [97.4 F (36.3 C)-98.2 F (36.8 C)] 98.2 F (36.8 C) (03/10 0510) Pulse Rate:  [65-90] 66 (03/10 0800) Resp:  [0-20] 0 (03/10 0800) BP: (80-147)/(50-92) 122/77 (03/10 0800) SpO2:  [93 %-100 %] 97 % (03/10 0800) Weight:  [75.5 kg] 75.5 kg (03/09 1628)   General:  Alert, Well-developed, well-nourished, pleasant and cooperative in NAD; pale and chronically ill-appearing, but appears much better than yesterday. Head:  Normocephalic and atraumatic. Eyes:  Sclera clear, no icterus.  Conjunctiva pink. Ears:  Normal auditory acuity. Mouth:  No deformity or lesions.   Lungs:  Clear throughout to auscultation.  No wheezes, crackles, or rhonchi.  Heart:  Regular rate and rhythm; no murmurs, clicks, rubs, or gallops. Abdomen:  Soft, non-distended.  BS present.  Non-tender.  Pain pump noted in LLQ.   Rectal:  Deferred.  Was heme positive by EDP. Msk:  Symmetrical without gross deformities. Pulses:  Normal pulses noted. Extremities:  Without clubbing or edema. Neurologic:  Alert and oriented x 4;  grossly normal neurologically. Skin:  Intact without significant lesions or rashes. Psych:  Alert and cooperative. Normal mood and affect.  Intake/Output from previous day: 03/09 0701 - 03/10 0700 In: 1945 [Blood:945; IV Piggyback:1000] Out: -   Lab Results: Recent Labs    02/28/20 1636 02/29/20 0734  WBC 5.4 5.6  HGB  5.3* 6.8*  HCT 18.5* 22.6*  PLT 113* 98*   BMET Recent Labs    02/28/20 1636 02/29/20 0734  NA 141 138  K 4.7 4.8  CL 109 110  CO2 25 23  GLUCOSE 101* 92  BUN 28* 29*  CREATININE 1.52* 1.36*  CALCIUM 7.4* 7.1*   LFT Recent Labs    02/28/20 1636  PROT 5.3*  ALBUMIN 2.4*  AST 17  ALT 22  ALKPHOS 69  BILITOT 0.4   PT/INR Recent Labs    02/28/20 1636  LABPROT 27.5*  INR 2.6*   IMPRESSION:  1. Heme positive stool with reported black stool x 1 at home.  Admission for upper GI bleed in 08/2019, likely secondary to esophagitis found on EGD.  2. H.pylori gastritis, treated and stool Ag negative in February.   3. ABLA, s/p 2u PRBC.  Anemia is acute on chronic.  Hemoglobin 5.3 g upon evaluation in the ED yesterday, which is down 3 g from just 5 days prior.  Hemoglobin 7.2 g this AM after transfusion.  4. Hx of PE, chronically anticoagulated on Xarelto with last dose 3/9 AM.  On hold.  5. 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 He is s/p radical nephrectomy, stereotactic radiotherapy 2010, radiation of right sacral bone and left shoulder . Disease progression on chemotherapy. Currently on Cabometyx with imaging showing stable disease.  Followed by Dr. [name].   5. Thrombocytopenia, ? Chemo related.   6.  Coagulopathy:  INR 2.6 on admission.  7.  Chronic diarrhea thought to be secondary to chemo regimen.  ? Worsening since his abx for Hpylori.  8.  Intermittent vomiting since September 2020  PLAN: -Monitor hemoglobin and transfuse further if needed. -Agree with pantoprazole 40 mg IV twice daily for now. -We will allow clear liquids today. -We will check PT/INR later today. -Check stool for Cdiff. -We will plan for EGD and colonoscopy on March 11.  Has not had colonoscopy since 2009.  **I did call and speak with his wife, [name], gave her an update on his condition and treatment plan.   Laban Emperor. Zehr  02/29/2020, 8:52 AM     Attending physician's note   I have taken a history, examined the patient and reviewed the chart. I agree with the Advanced Practitioner's note, impression and recommendations.  54 year old male with metastatic renal cell carcinoma on chronic anticoagulation, Xarelto for PE with heme positive stool and severe anemia, hemoglobin 5.3  Monitor hemoglobin and transfuse to hemoglobin greater than 7  Reviewed EGD September 2020 with  severe erosive esophagitis and gastropathy Plan to repeat EGD along with colonoscopy to evaluate source of GI blood loss and therapeutic intervention if needed Hold Xarelto Clear liquids with bowel prep The risks and benefits as well as alternatives of endoscopic procedure(s) have been discussed and reviewed. All questions answered. The patient agrees to proceed.   Damaris Hippo , MD [phone]

## 2020-02-29 NOTE — Progress Notes (Signed)
Hypoglycemic Event  CBG: 63   Treatment: 4oz of juice given  Symptoms: pt was asymptomatic  Follow-up CBG: Time 1746 CBG Result:93  Possible Reasons for Event: Per patient he has not eaten since yesterday  Comments/MD notified: paged currently no new orders received     Keith [name]

## 2020-03-01 ENCOUNTER — Encounter (HOSPITAL_COMMUNITY): Payer: Self-pay | Admitting: Internal Medicine

## 2020-03-01 ENCOUNTER — Inpatient Hospital Stay (HOSPITAL_COMMUNITY): Payer: Medicare Other | Admitting: Anesthesiology

## 2020-03-01 ENCOUNTER — Other Ambulatory Visit: Payer: Self-pay

## 2020-03-01 ENCOUNTER — Encounter (HOSPITAL_COMMUNITY)
Admission: EM | Disposition: A | Discharge status: Home / Self Care | Payer: Self-pay | Source: Home / Self Care | Attending: Internal Medicine

## 2020-03-01 ENCOUNTER — Inpatient Hospital Stay (HOSPITAL_COMMUNITY): Payer: Medicare Other

## 2020-03-01 DIAGNOSIS — K2971 Gastritis, unspecified, with bleeding: Principal | ICD-10-CM

## 2020-03-01 DIAGNOSIS — K2100 Gastro-esophageal reflux disease with esophagitis, without bleeding: Secondary | ICD-10-CM

## 2020-03-01 HISTORY — PX: COLONOSCOPY WITH PROPOFOL: SHX5780

## 2020-03-01 HISTORY — PX: BIOPSY: SHX5522

## 2020-03-01 HISTORY — PX: ESOPHAGOGASTRODUODENOSCOPY (EGD) WITH PROPOFOL: SHX5813

## 2020-03-01 LAB — MAGNESIUM: Magnesium: 1.9 mg/dL (ref 1.7–2.4)

## 2020-03-01 LAB — HEMOGLOBIN AND HEMATOCRIT, BLOOD
HCT: 24.4 % — ABNORMAL LOW (ref 39.0–52.0)
Hemoglobin: 7 g/dL — ABNORMAL LOW (ref 13.0–17.0)

## 2020-03-01 LAB — GLUCOSE, CAPILLARY
Glucose-Capillary: 82 mg/dL (ref 70–99)
Glucose-Capillary: 67 mg/dL — ABNORMAL LOW (ref 70–99)
Glucose-Capillary: 114 mg/dL — ABNORMAL HIGH (ref 70–99)

## 2020-03-01 LAB — HEMATOCRIT: HCT: 23.3 % — ABNORMAL LOW (ref 39.0–52.0)

## 2020-03-01 SURGERY — ESOPHAGOGASTRODUODENOSCOPY (EGD) WITH PROPOFOL
Anesthesia: Monitor Anesthesia Care

## 2020-03-01 MED ORDER — TECHNETIUM TC 99M-LABELED RED BLOOD CELLS IV KIT
21.9000 | PACK | Freq: Once | INTRAVENOUS | Status: AC
  Administered 2020-03-01: 21.9 via INTRAVENOUS

## 2020-03-01 MED ORDER — PROPOFOL 10 MG/ML IV BOLUS
INTRAVENOUS | Status: DC | PRN
  Administered 2020-03-01 (×21): 20 mg via INTRAVENOUS

## 2020-03-01 MED ORDER — LIDOCAINE 2% (20 MG/ML) 5 ML SYRINGE
INTRAMUSCULAR | Status: DC | PRN
  Administered 2020-03-01: 60 mg via INTRAVENOUS
  Administered 2020-03-01: 40 mg via INTRAVENOUS

## 2020-03-01 MED ORDER — LACTATED RINGERS IV SOLN
INTRAVENOUS | Status: AC | PRN
  Administered 2020-03-01: 1000 mL via INTRAVENOUS

## 2020-03-01 SURGICAL SUPPLY — 25 items

## 2020-03-01 NOTE — Op Note (Signed)
[facility] Patient Name: Keith [name] Procedure Date: 03/01/2020 MRN: [id number] Attending MD: Mauri Pole , MD Date of Birth: 02-23-1966 CSN: [id number] Age: 54 Admit Type: Inpatient Procedure:                Colonoscopy Indications:              Evaluation of unexplained GI bleeding presenting                            with Hematochezia Providers:                Mauri Pole, MD, Burtis Junes, RN, Cherylynn Ridges, Technician, Glenis Smoker, CRNA Referring MD:              Medicines:                Monitored Anesthesia Care Complications:            No immediate complications. Estimated Blood Loss:     Estimated blood loss was minimal. Procedure:                Pre-Anesthesia Assessment:                           - Prior to the procedure, a History and Physical                            was performed, and patient medications and                            allergies were reviewed. The patient's tolerance of                            previous anesthesia was also reviewed. The risks                            and benefits of the procedure and the sedation                            options and risks were discussed with the patient.                            All questions were answered, and informed consent                            was obtained. Prior Anticoagulants: The patient                            last took Xarelto (rivaroxaban) 2 days prior to the                            procedure. ASA Grade Assessment: III - A patient  with severe systemic disease. After reviewing the                            risks and benefits, the patient was deemed in                            satisfactory condition to undergo the procedure.                           After obtaining informed consent, the colonoscope                            was passed under direct vision. Throughout the   procedure, the patient's blood pressure, pulse, and                            oxygen saturations were monitored continuously. The                            CF-HQ190L ([id number] Olympus colonoscope was                            introduced through the anus and advanced to the the                            cecum, identified by appendiceal orifice and                            ileocecal valve. The colonoscopy was performed                            without difficulty. The patient tolerated the                            procedure well. The quality of the bowel                            preparation was not adequate to identify polyps 6                            mm and larger in size. The ileocecal valve,                            appendiceal orifice, and rectum were photographed. Scope In: 11:38:28 AM Scope Out: 12:00:34 PM Scope Withdrawal Time: 0 hours 10 minutes 26 seconds  Total Procedure Duration: 0 hours 22 minutes 6 seconds  Findings:      The perianal and digital rectal examinations were normal.      The terminal ileum contained hematin (altered blood/coffee-ground-like       material). Underlying mucosa appeared normal with ulceration on wash and       suction.      Hematin (altered blood/coffee-ground-like material) was found in the       entire colon. Underlying mucosa appeared normal with ulceration on wash       and  suction.      Non-bleeding internal hemorrhoids were found during retroflexion. The       hemorrhoids were medium-sized. Impression:               - Preparation of the colon was inadequate.                           - Blood in the terminal ileum.                           - Blood in the entire examined colon.                           - Non-bleeding internal hemorrhoids.                           - No specimens collected. Moderate Sedation:      Not Applicable - Patient had care per Anesthesia. Recommendation:           - Patient has a contact number  available for                            emergencies. The signs and symptoms of potential                            delayed complications were discussed with the                            patient. Return to normal activities tomorrow.                            Written discharge instructions were provided to the                            patient.                           - Clear liquid diet.                           - Continue present medications.                           - Await pathology results (Gastric biopsies)                           - Check CBC, transfuse if Hgb <7                           - NM RBC tag scan to evaluate the source of                            possible small bowel bleed                           -Cont to hold Xarelto Procedure Code(s):        --- Professional ---  45378, Colonoscopy, flexible; diagnostic, including                            collection of specimen(s) by brushing or washing,                            when performed (separate procedure) Diagnosis Code(s):        --- Professional ---                           K64.8, Other hemorrhoids                           K92.2, Gastrointestinal hemorrhage, unspecified                           K92.1, Melena (includes Hematochezia) CPT copyright 2019 [employer]. All rights reserved. The codes documented in this report are preliminary and upon coder review may  be revised to meet current compliance requirements. Mauri Pole, MD 03/01/2020 12:28:57 PM This report has been signed electronically. Number of Addenda: 0

## 2020-03-01 NOTE — Op Note (Signed)
[facility] Patient Name: Keith [name] Procedure Date: 03/01/2020 MRN: [id number] Attending MD: Mauri Pole , MD Date of Birth: Apr 18, 1966 CSN: [id number] Age: 54 Admit Type: Inpatient Procedure:                Upper GI endoscopy Indications:              Gastrointestinal bleeding of unknown origin.                            Hematochezia Providers:                Mauri Pole, MD, Burtis Junes, RN, Cherylynn Ridges, Technician, Glenis Smoker, CRNA Referring MD:              Medicines:                Monitored Anesthesia Care Complications:            No immediate complications. Estimated Blood Loss:     Estimated blood loss was minimal. Procedure:                Pre-Anesthesia Assessment:                           - Prior to the procedure, a History and Physical                            was performed, and patient medications and                            allergies were reviewed. The patient's tolerance of                            previous anesthesia was also reviewed. The risks                            and benefits of the procedure and the sedation                            options and risks were discussed with the patient.                            All questions were answered, and informed consent                            was obtained. Prior Anticoagulants: The patient                            last took Xarelto (rivaroxaban) 2 days prior to the                            procedure. ASA Grade Assessment: III - A patient  with severe systemic disease. After reviewing the                            risks and benefits, the patient was deemed in                            satisfactory condition to undergo the procedure.                           After obtaining informed consent, the endoscope was                            passed under direct vision. Throughout the   procedure, the patient's blood pressure, pulse, and                            oxygen saturations were monitored continuously. The                            GIF-H190 ([id number] Olympus gastroscope was                            introduced through the mouth, and advanced to the                            second part of duodenum. The upper GI endoscopy was                            accomplished without difficulty. The patient                            tolerated the procedure well. Scope In: Scope Out: Findings:      LA Grade C (one or more mucosal breaks continuous between tops of 2 or       more mucosal folds, less than 75% circumference) esophagitis was found       37 to 38 cm from the incisors.      Localized severe inflammation with hemorrhage characterized by       congestion (edema), erythema, friability, granularity and shallow       ulcerations was found in the gastric fundus. Biopsies were taken with a       cold forceps for histology.      The examined duodenum was normal. Impression:               - LA Grade C reflux esophagitis.                           - Gastritis with hemorrhage. Biopsied.                           - Normal examined duodenum. Moderate Sedation:      Not Applicable - Patient had care per Anesthesia. Recommendation:           - See the other procedure note for documentation of  additional recommendations. Procedure Code(s):        --- Professional ---                           [phone], Esophagogastroduodenoscopy, flexible,                            transoral; with biopsy, single or multiple Diagnosis Code(s):        --- Professional ---                           K21.00, Gastro-esophageal reflux disease with                            esophagitis, without bleeding                           K29.71, Gastritis, unspecified, with bleeding                           K92.2, Gastrointestinal hemorrhage, unspecified CPT copyright 2019  American Medical Association. All rights reserved. The codes documented in this report are preliminary and upon coder review may  be revised to meet current compliance requirements. Mauri Pole, MD 03/01/2020 12:23:45 PM This report has been signed electronically. Number of Addenda: 0

## 2020-03-01 NOTE — Anesthesia Preprocedure Evaluation (Addendum)
Anesthesia Evaluation  Patient identified by MRN, date of birth, ID band Patient awake    Reviewed: Allergy & Precautions, NPO status , Patient's Chart, lab work & pertinent test results  Airway Mallampati: I  TM Distance: >3 FB Neck ROM: Full    Dental no notable dental hx. (+) Edentulous Upper, Edentulous Lower   Pulmonary former smoker, PE   Pulmonary exam normal breath sounds clear to auscultation       Cardiovascular hypertension, Normal cardiovascular exam Rhythm:Regular Rate:Normal  TTE 2016 Normal LVEF, valves ok   Neuro/Psych PSYCHIATRIC DISORDERS Depression negative neurological ROS     GI/Hepatic negative GI ROS, (+)     substance abuse  cocaine use and marijuana use,   Endo/Other  negative endocrine ROSdiabetes  Renal/GU Renal InsufficiencyRenal disease  negative genitourinary   Musculoskeletal negative musculoskeletal ROS (+)   Abdominal   Peds  Hematology  (+) Blood dyscrasia (Hgb 7.8, plt 98, on xarelto), anemia ,   Anesthesia Other Findings Renal CA with mets to pancreas, lungs, and bone  Reproductive/Obstetrics                            Anesthesia Physical Anesthesia Plan  ASA: III  Anesthesia Plan: MAC   Post-op Pain Management:    Induction: Intravenous  PONV Risk Score and Plan: 1 and Propofol infusion and Treatment may vary due to age or medical condition  Airway Management Planned: Natural Airway  Additional Equipment:   Intra-op Plan:   Post-operative Plan:   Informed Consent: I have reviewed the patients History and Physical, chart, labs and discussed the procedure including the risks, benefits and alternatives for the proposed anesthesia with the patient or authorized representative who has indicated his/her understanding and acceptance.     Dental advisory given  Plan Discussed with: CRNA  Anesthesia Plan Comments:         Anesthesia  Quick Evaluation

## 2020-03-01 NOTE — Transfer of Care (Signed)
Immediate Anesthesia Transfer of Care Note  Patient: JAIRE PINKHAM  Procedure(s) Performed: Procedure(s): ESOPHAGOGASTRODUODENOSCOPY (EGD) WITH PROPOFOL (N/A) COLONOSCOPY WITH PROPOFOL (N/A) BIOPSY  Patient Location: PACU  Anesthesia Type:MAC  Level of Consciousness:  sedated, patient cooperative and responds to stimulation  Airway & Oxygen Therapy:Patient Spontanous Breathing and Patient connected to face mask oxgen  Post-op Assessment:  Report given to PACU RN and Post -op Vital signs reviewed and stable  Post vital signs:  Reviewed and stable  Last Vitals:  Vitals:   03/01/20 1011 03/01/20 1207  BP: (!) 150/91 119/75  Pulse: 66 65  Resp: 15 13  Temp: 36.8 C   SpO2: 69% 450%    Complications: No apparent anesthesia complications

## 2020-03-01 NOTE — Progress Notes (Addendum)
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 Last GI bleed was in September 2020, patient had an EGD that revealed grade C esophagitis and nonbleeding erosive gastropathy. Patient experienced near syncopal episode while visiting his primary care provider earlier today. Is not clear whether patient actually passed out. Patient was advised to come to the hospital for further assessment and management. On presentation to the hospital, patient's hemoglobin was found to be 5.3 g/dL, down from 8.3 g/dL. Platelet count is 113, INR is 2.6 with positive fecal occult blood. No headache, no neck pain, no chest pain, no GI symptoms and no urinary symptoms. Hospitalist team has been asked to admit patient for further assessment and management.  ED Course:On presentation to the hospital, this revealed temperature of 97.4, blood pressure of 80-140/50-79 mmHg, heart rate of 72 bpm, respiratory rate of 14 and O2 sat of 99%. Work-up done so far is as documented above. Patient will be transfused with 2 units of packed red blood cells. ER provider has consulted the GI consultant on-call, Dr. Hilarie Fredrickson.  Assessment & Plan:   Active Problems:   Symptomatic anemia   Acute GI bleeding   Acute blood loss anemia  #1 GI bleed-egd-reflux esophagitis, gastritis with hemorrhage biopsied. Colonoscopy blood in the terminal ileum entire examined colon and nonbleeding internal hemorrhoids. GI recommending clear liquid diet continue Protonix follow-up pathology results RBC tagged scan to evaluate the source of small bowel bleed and continue to hold Xarelto. H&H every 6   #2 acute on chronic  anemia hemoglobin 5.3 upon admission to the ER status post 2 units of packed RBC with hemoglobin up to 7.3.  #3 history of pulmonary embolism on Xarelto continue to hold  #4 history of renal cell carcinoma metastatic status post nephrectomy radiotherapy followed by Dr. [name]  #5 chronic pain has a pain pump in place left lower quadrant  #6 chronic diarrhea question secondary to recent antibiotics versus chemotherapy  #7 elevated INR unclear reason on Xarelto which is on hold but that should not affect his INR.  Liver functions otherwise okay.  #8 hypomagnesemia replete recheck labs in a.m.     Estimated body mass index is 20.8 kg/m as calculated from the following:   Height as of this encounter: 6\' 3"  (1.905 m).   Weight as of this encounter: 75.5 kg.  DVT prophylaxis: SCD  code Status: Full code Family Communication: dw wife Disposition Plan: Patient came from home plan will be to discharge home once the GI bleeding is resolved and work-up is completed. Consultants:   GI  Procedures: None Antimicrobials: None  Subjective:  No further bleeding since yesterday evening at 5 PM Objective: Vitals:   03/01/20 1210 03/01/20 1220 03/01/20 1249 03/01/20 1252  BP: 120/77 129/83 (!) 143/87   Pulse: 60 63 (!) 52   Resp: 12 15 18    Temp:    (!) 97.3 F (36.3 C)  TempSrc:    Oral  SpO2: 100% 100% 100%   Weight:      Height:        Intake/Output Summary (Last 24 hours) at 03/01/2020 1340 Last data filed at 03/01/2020 1200 Gross per 24 hour  Intake 1110 ml  Output 400  ml  Net 710 ml   Filed Weights   02/28/20 1628 03/01/20 1011  Weight: 75.5 kg 75.5 kg    Examination:  General exam: Appears calm and comfortable  Respiratory system: Clear to auscultation. Respiratory effort normal. Cardiovascular system: S1 & S2 heard, RRR. No JVD, murmurs, rubs, gallops or clicks. No pedal edema. Gastrointestinal system: Abdomen is nondistended, soft and nontender. No  organomegaly or masses felt. Normal bowel sounds heard. Central nervous system: Alert and oriented. No focal neurological deficits. Extremities: Symmetric 5 x 5 power. Skin: No rashes, lesions or ulcers Psychiatry: Judgement and insight appear normal. Mood & affect appropriate.     Data Reviewed: I have personally reviewed following labs and imaging studies  CBC: Recent Labs  Lab 02/28/20 1636 02/29/20 0734 02/29/20 1017 02/29/20 1641 03/01/20 0044  WBC 5.4 5.6  --   --   --   NEUTROABS 4.1  --   --   --   --   HGB 5.3* 6.8* 7.2* 7.8*  --   HCT 18.5* 22.6* 24.0* 26.2* 23.3*  MCV 109.5* 100.4*  --   --   --   PLT 113* 98*  --   --   --    Basic Metabolic Panel: Recent Labs  Lab 02/28/20 1636 02/29/20 0734 03/01/20 0044  NA 141 138  --   K 4.7 4.8  --   CL 109 110  --   CO2 25 23  --   GLUCOSE 101* 92  --   BUN 28* 29*  --   CREATININE 1.52* 1.36*  --   CALCIUM 7.4* 7.1*  --   MG  --  1.4* 1.9  PHOS  --  2.9  --    GFR: Estimated Creatinine Clearance: 67.9 mL/min (A) (by C-G formula based on SCr of 1.36 mg/dL (H)). Liver Function Tests: Recent Labs  Lab 02/28/20 1636  AST 17  ALT 22  ALKPHOS 69  BILITOT 0.4  PROT 5.3*  ALBUMIN 2.4*   No results for input(s): LIPASE, AMYLASE in the last 168 hours. No results for input(s): AMMONIA in the last 168 hours. Coagulation Profile: Recent Labs  Lab 02/28/20 1636 02/29/20 1641  INR 2.6* 1.3*   Cardiac Enzymes: No results for input(s): CKTOTAL, CKMB, CKMBINDEX, TROPONINI in the last 168 hours. BNP (last 3 results) No results for input(s): PROBNP in the last 8760 hours. HbA1C: Recent Labs    02/29/20 0734  HGBA1C 4.9   CBG: Recent Labs  Lab 02/29/20 1152 02/29/20 1707 02/29/20 1748 02/29/20 2227 03/01/20 0821  GLUCAP 84 63* 93 78 82   Lipid Profile: No results for input(s): CHOL, HDL, LDLCALC, TRIG, CHOLHDL, LDLDIRECT in the last 72 hours. Thyroid Function Tests: No results for input(s): TSH,  T4TOTAL, FREET4, T3FREE, THYROIDAB in the last 72 hours. Anemia Panel: No results for input(s): VITAMINB12, FOLATE, FERRITIN, TIBC, IRON, RETICCTPCT in the last 72 hours. Sepsis Labs: No results for input(s): PROCALCITON, LATICACIDVEN in the last 168 hours.  Recent Results (from the past 240 hour(s))  Respiratory Panel by RT PCR (Flu A&B, Covid) - Nasopharyngeal Swab     Status: None   Collection Time: 02/28/20  7:51 PM   Specimen: Nasopharyngeal Swab  Result Value Ref Range Status   SARS Coronavirus 2 by RT PCR NEGATIVE NEGATIVE Final    Comment: (NOTE) SARS-CoV-2 target nucleic acids are NOT DETECTED. The SARS-CoV-2 RNA is generally detectable in upper respiratoy specimens during the acute phase of infection. The lowest concentration of  SARS-CoV-2 viral copies this assay can detect is 131 copies/mL. A negative result does not preclude SARS-Cov-2 infection and should not be used as the sole basis for treatment or other patient management decisions. A negative result may occur with  improper specimen collection/handling, submission of specimen other than nasopharyngeal swab, presence of viral mutation(s) within the areas targeted by this assay, and inadequate number of viral copies (<131 copies/mL). A negative result must be combined with clinical observations, patient history, and epidemiological information. The expected result is Negative. Fact Sheet for Patients:  PinkCheek.be Fact Sheet for Healthcare Providers:  GravelBags.it This test is not yet ap proved or cleared by the [place] FDA and  has been authorized for detection and/or diagnosis of SARS-CoV-2 by FDA under an Emergency Use Authorization (EUA). This EUA will remain  in effect (meaning this test can be used) for the duration of the COVID-19 declaration under Section 564(b)(1) of the Act, 21 U.S.C. section 360bbb-3(b)(1), unless the authorization is  terminated or revoked sooner.    Influenza A by PCR NEGATIVE NEGATIVE Final   Influenza B by PCR NEGATIVE NEGATIVE Final    Comment: (NOTE) The Xpert Xpress SARS-CoV-2/FLU/RSV assay is intended as an aid in  the diagnosis of influenza from Nasopharyngeal swab specimens and  should not be used as a sole basis for treatment. Nasal washings and  aspirates are unacceptable for Xpert Xpress SARS-CoV-2/FLU/RSV  testing. Fact Sheet for Patients: PinkCheek.be Fact Sheet for Healthcare Providers: GravelBags.it This test is not yet approved or cleared by the [place] FDA and  has been authorized for detection and/or diagnosis of SARS-CoV-2 by  FDA under an Emergency Use Authorization (EUA). This EUA will remain  in effect (meaning this test can be used) for the duration of the  Covid-19 declaration under Section 564(b)(1) of the Act, 21  U.S.C. section 360bbb-3(b)(1), unless the authorization is  terminated or revoked. Performed at [facility], [place] [address], [place], [place] [address]          Radiology Studies: No results found.      Scheduled Meds: . DULoxetine  60 mg Oral BID  . ferrous sulfate  325 mg Oral Q breakfast  . gabapentin  300 mg Oral TID  . insulin aspart  0-6 Units Subcutaneous TID WC  . pantoprazole (PROTONIX) IV  40 mg Intravenous Q12H  . sucralfate  1 g Oral Q6H   Continuous Infusions:   LOS: 1 day     Georgette Shell, MD 03/01/2020, 1:40 PM

## 2020-03-01 NOTE — Anesthesia Procedure Notes (Signed)
Procedure Name: MAC Date/Time: 03/01/2020 11:07 AM Performed by: Cynda Familia, CRNA Pre-anesthesia Checklist: Patient identified, Emergency Drugs available, Suction available, Patient being monitored and Timeout performed Patient Re-evaluated:Patient Re-evaluated prior to induction Oxygen Delivery Method: Simple face mask Placement Confirmation: breath sounds checked- equal and bilateral and positive ETCO2 Dental Injury: Teeth and Oropharynx as per pre-operative assessment  Comments: Bite block by RN

## 2020-03-02 ENCOUNTER — Encounter: Payer: Self-pay | Admitting: *Deleted

## 2020-03-02 ENCOUNTER — Inpatient Hospital Stay (HOSPITAL_COMMUNITY): Payer: Medicare Other

## 2020-03-02 ENCOUNTER — Other Ambulatory Visit: Payer: Self-pay

## 2020-03-02 LAB — GLUCOSE, CAPILLARY
Glucose-Capillary: 101 mg/dL — ABNORMAL HIGH (ref 70–99)
Glucose-Capillary: 82 mg/dL (ref 70–99)
Glucose-Capillary: 88 mg/dL (ref 70–99)
Glucose-Capillary: 106 mg/dL — ABNORMAL HIGH (ref 70–99)

## 2020-03-02 LAB — BASIC METABOLIC PANEL
Anion gap: 6 (ref 5–15)
BUN: 19 mg/dL (ref 6–20)
CO2: 25 mmol/L (ref 22–32)
Calcium: 7.9 mg/dL — ABNORMAL LOW (ref 8.9–10.3)
Chloride: 106 mmol/L (ref 98–111)
Creatinine, Ser: 1.22 mg/dL (ref 0.61–1.24)
GFR calc Af Amer: >60 mL/min (ref >60)
GFR calc non Af Amer: >60 mL/min (ref >60)
Glucose, Bld: 80 mg/dL (ref 70–99)
Potassium: 4.7 mmol/L (ref 3.5–5.1)
Sodium: 137 mmol/L (ref 135–145)

## 2020-03-02 LAB — HEMOGLOBIN AND HEMATOCRIT, BLOOD
HCT: 21.9 % — ABNORMAL LOW (ref 39.0–52.0)
HCT: 28 % — ABNORMAL LOW (ref 39.0–52.0)
HCT: 27.7 % — ABNORMAL LOW (ref 39.0–52.0)
HCT: 27.1 % — ABNORMAL LOW (ref 39.0–52.0)
Hemoglobin: 6.5 g/dL — CL (ref 13.0–17.0)
Hemoglobin: 8.6 g/dL — ABNORMAL LOW (ref 13.0–17.0)
Hemoglobin: 8.4 g/dL — ABNORMAL LOW (ref 13.0–17.0)
Hemoglobin: 8.2 g/dL — ABNORMAL LOW (ref 13.0–17.0)

## 2020-03-02 LAB — PREPARE RBC (CROSSMATCH)

## 2020-03-02 MED ORDER — SODIUM CHLORIDE 0.9 % IV SOLN: INTRAVENOUS | Status: DC

## 2020-03-02 MED ORDER — SODIUM CHLORIDE (PF) 0.9 % IJ SOLN
INTRAMUSCULAR | Status: AC
  Filled 2020-03-02: qty 50

## 2020-03-02 MED ORDER — SODIUM CHLORIDE 0.9% IV SOLUTION: Freq: Once | INTRAVENOUS | Status: AC

## 2020-03-02 MED ORDER — IOHEXOL 350 MG/ML SOLN
100.0000 mL | Freq: Once | INTRAVENOUS | Status: AC | PRN
  Administered 2020-03-02: 100 mL via INTRAVENOUS

## 2020-03-02 MED ORDER — POLYETHYLENE GLYCOL 3350 17 GM/SCOOP PO POWD
0.5000 | Freq: Once | ORAL | Status: AC
  Administered 2020-03-02: 127.5 g via ORAL
  Filled 2020-03-02: qty 255

## 2020-03-02 NOTE — Progress Notes (Addendum)
[facility] Progress Note  CC:  GI bleed  Subjective:  Has not passed anymore bowel movements of blood since colonoscopy.  Hgb dropped this AM so received 2 more units PRBC's.  Feels ok.  No abdominal pain, etc.  Objective:  Vital signs in last 24 hours: Temp:  [97.3 F (36.3 C)-98.4 F (36.9 C)] 97.8 F (36.6 C) (03/12 0815) Pulse Rate:  [52-75] 75 (03/12 0815) Resp:  [12-20] 15 (03/12 0815) BP: (119-150)/(75-91) 140/91 (03/12 0815) SpO2:  [95 %-100 %] 98 % (03/12 0815) Weight:  [75.5 kg] 75.5 kg (03/11 1011) Last BM Date: 03/01/20 General:  Alert, Well-developed, in NAD Heart:  Regular rate and rhythm; no murmurs Pulm:  CTAB.  No W/R/R. Abdomen:  Soft, non-distended.  BS present.  Non-tender. Extremities:  Without edema. Neurologic:  Alert and oriented x 4;  grossly normal neurologically. Psych:  Alert and cooperative. Normal mood and affect.  Intake/Output from previous day: 03/11 0701 - 03/12 0700 In: 2274.2 [P.O.:840; I.V.:1081.7; Blood:352.5] Out: 900 [Urine:900] Intake/Output this shift: Total I/O In: 342.5 [Blood:342.5] Out: -   Lab Results: Recent Labs    02/28/20 1636 02/28/20 1636 02/29/20 0734 02/29/20 1017 02/29/20 1641 02/29/20 1641 03/01/20 0044 03/01/20 1904 03/02/20 0043  WBC 5.4  --  5.6  --   --   --   --   --   --   HGB 5.3*   < > 6.8*   < > 7.8*  --   --  7.0* 6.5*  HCT 18.5*   < > 22.6*   < > 26.2*   < > 23.3* 24.4* 21.9*  PLT 113*  --  98*  --   --   --   --   --   --    < > = values in this interval not displayed.   BMET Recent Labs    02/28/20 1636 02/29/20 0734  NA 141 138  K 4.7 4.8  CL 109 110  CO2 25 23  GLUCOSE 101* 92  BUN 28* 29*  CREATININE 1.52* 1.36*  CALCIUM 7.4* 7.1*   LFT Recent Labs    02/28/20 1636  PROT 5.3*  ALBUMIN 2.4*  AST 17  ALT 22  ALKPHOS 69  BILITOT 0.4   PT/INR Recent Labs    02/28/20 1636 02/29/20 1641  LABPROT 27.5* 15.7*  INR 2.6* 1.3*   NM GI Blood  Loss  Result Date: 03/01/2020 CLINICAL DATA:  GI bleeding EXAM: NUCLEAR MEDICINE GASTROINTESTINAL BLEEDING SCAN TECHNIQUE: Sequential abdominal images were obtained following intravenous administration of Tc-22m labeled red blood cells. RADIOPHARMACEUTICALS:  21.9 mCi Tc-60m pertechnetate in-vitro labeled red cells. COMPARISON:  None. FINDINGS: Adequate uptake of radiotracer is noted within the spleen and liver as well as the blood pool. Normal excretion is noted bilaterally. No focal area of increased uptake to suggest active hemorrhage is noted. IMPRESSION: No evidence of active hemorrhage. Electronically Signed   By: Inez Catalina M.D.   On: 03/01/2020 18:57   Assessment / Plan: 1.GIB:  EGD 3/11 showed Grade C esophagitis and gastritis that was friable, but not enough to account for degree of anemia, etc.  Colonoscopy with blood in the TI and throughout the colon, but no source seen.  NM bleeding scan negative.   2. H.pylori gastritis, treated and stool Ag negative in February.  3. ABLA, s/p 2u PRBC.  Anemia is acute on chronic.  Hemoglobin 5.3 g upon evaluation in the ED yesterday, which is down 3  g from just 5 days prior.  Hemoglobin 7.2 g this after transfusion.  Down to 6.5 grams again this AM.  Received 2 more units PRBC's.  4. Hx of PE, chronically anticoagulatedon Xarelto with last dose 3/9 AM.  On hold.  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 He is s/p radical nephrectomy, stereotactic radiotherapy 2010, radiation of right sacral bone and left shoulder . Disease progression on chemotherapy. Currently on Cabometyx with imaging showing stable disease. Followed by Dr. [name].   5. Thrombocytopenia, ? Chemo related.   6.  Chronic diarrhea thought to be secondary to chemo regimen.  ? Worsening since his abx for Hpylori.  7.  Intermittent vomiting since September 2020  -Will plan for CTA today to see if we can identify bleeding source and  re-evaluate cancer status to be sure there has been no progression with that involving the bowel that could be causing this bleeding issue. -Monitor Hgb and transfuse further prn. -Tentatively plan for VCE tomorrow as well pending results of CT.  Half Miralax bowel prep ordered.   LOS: 2 days   Keith [name]. Zehr  03/02/2020, 9:37 AM   Attending physician's note   I have taken an interval history, reviewed the chart and examined the patient. I agree with the Advanced Practitioner's note, impression and recommendations.   Small localized area in gastric fundus with friable ulcerated mucosa, biopsied but did not appear to be the etiology for GI hemorrhage and significant hematochezia  Terminal ileum and entire colon covered with coffee-ground and heme, after washing the surface of mucosa appeared unremarkable with no ulceration.  No diverticulosis.  Nuclear med scan negative for any active extravasation  Will obtain CT angio abdomen pelvis to get better cross-sectional imaging given his history of metastatic cancer to exclude any tumor induration  Continue to monitor hemoglobin and transfuse as needed  We will tentatively plan for small bowel video capsule study if continues to have persistent bleeding see if we can isolate the source of bleed. The risks and benefits as well as alternatives of endoscopic procedure(s) have been discussed and reviewed. All questions answered. The patient agrees to proceed.  Damaris Hippo , MD [phone]

## 2020-03-02 NOTE — Progress Notes (Signed)
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 Last GI bleed was in September 2020, patient had an EGD that revealed grade C esophagitis and nonbleeding erosive gastropathy. Patient experienced near syncopal episode while visiting his primary care provider earlier today. Is not clear whether patient actually passed out. Patient was advised to come to the hospital for further assessment and management. On presentation to the hospital, patient's hemoglobin was found to be 5.3 g/dL, down from 8.3 g/dL. Platelet count is 113, INR is 2.6 with positive fecal occult blood. No headache, no neck pain, no chest pain, no GI symptoms and no urinary symptoms. Hospitalist team has been asked to admit patient for further assessment and management.  ED Course:On presentation to the hospital, this revealed temperature of 97.4, blood pressure of 80-140/50-79 mmHg, heart rate of 72 bpm, respiratory rate of 14 and O2 sat of 99%. Work-up done so far is as documented above. Patient will be transfused with 2 units of packed red blood cells. ER provider has consulted the GI consultant on-call, Dr. Hilarie Fredrickson.  Assessment & Plan:   Active Problems:   Symptomatic anemia   Hematochezia   Acute GI bleeding   Acute blood loss anemia  #1 GI bleed-egd-reflux esophagitis, gastritis with hemorrhage biopsied. Colonoscopy blood in the terminal ileum entire examined colon and nonbleeding internal hemorrhoids. Tagged RBC scan with no evidence of active bleeding.  CT angiogram of the abdomen and pelvis ordered by GI.  Continue to hold Xarelto. Follow-up H&H. Question capsule endoscopy tomorrow  #2  acute on chronic anemia hemoglobin dropped to 6.5 overnight 2 units of blood transfusion was ordered which he received and hemoglobin came up to 8.6 today.   #3 history of pulmonary embolism on Xarelto continue to hold  #4 history of renal cell carcinoma metastatic status post nephrectomy radiotherapy followed by Dr. [name]  #5 chronic pain has a pain pump in place left lower quadrant  #6 chronic diarrhea question secondary to recent antibiotics versus chemotherapy  #7 elevated INR unclear reason on Xarelto which is on hold but that should not affect his INR. Liver functions otherwise okay.  #8 hypomagnesemia replete recheck labs in a.m.      Estimated body mass index is 20.8 kg/m as calculated from the following:   Height as of this encounter: 6\' 3"  (1.905 m).   Weight as of this encounter: 75.5 kg.    DVT prophylaxis:SCD  code Status:Full code Family Communication:dw wife Disposition Plan:Patient came from home plan will be to discharge home once the GI bleeding is resolved and work-up is completed. Consultants:  GI  Procedures:None Antimicrobials:None  Subjective:  He has not had any further BMs nausea or vomiting.  However hemoglobin dropped and started blood transfusion this morning. Objective: Vitals:   03/02/20 0330 03/02/20 0539 03/02/20 0610 03/02/20 0815  BP: 125/78 136/81 130/81 (!) 140/91  Pulse: 68 71 68 75  Resp:   16 15  Temp: 97.6 F (36.4 C) 97.6 F (36.4 C) (!) 97.5 F (36.4 C) 97.8 F (36.6 C)  TempSrc: Oral Oral Oral Oral  SpO2: 100% 99% 99% 98%  Weight:      Height:        Intake/Output Summary (  Last 24 hours) at 03/02/2020 1442 Last data filed at 03/02/2020 1300 Gross per 24 hour  Intake 1916.67 ml  Output 900 ml  Net 1016.67 ml   Filed Weights   02/28/20 1628 03/01/20 1011  Weight: 75.5 kg 75.5 kg    Examination:  General exam: Appears calm and comfortable  Respiratory system: Clear to auscultation. Respiratory  effort normal. Cardiovascular system: S1 & S2 heard, RRR. No JVD, murmurs, rubs, gallops or clicks. No pedal edema. Gastrointestinal system: Abdomen is nondistended, soft and nontender. No organomegaly or masses felt. Normal bowel sounds heard.  Pain pump left lower quadrant Central nervous system: Alert and oriented. No focal neurological deficits. Extremities: Symmetric 5 x 5 power. Skin: No rashes, lesions or ulcers Psychiatry: Judgement and insight appear normal. Mood & affect appropriate.     Data Reviewed: I have personally reviewed following labs and imaging studies  CBC: Recent Labs  Lab 02/28/20 1636 02/28/20 1636 02/29/20 0734 02/29/20 0734 02/29/20 1017 02/29/20 1017 02/29/20 1641 03/01/20 0044 03/01/20 1904 03/02/20 0043 03/02/20 1034  WBC 5.4  --  5.6  --   --   --   --   --   --   --   --   NEUTROABS 4.1  --   --   --   --   --   --   --   --   --   --   HGB 5.3*   < > 6.8*   < > 7.2*  --  7.8*  --  7.0* 6.5* 8.6*  HCT 18.5*   < > 22.6*   < > 24.0*   < > 26.2* 23.3* 24.4* 21.9* 28.0*  MCV 109.5*  --  100.4*  --   --   --   --   --   --   --   --   PLT 113*  --  98*  --   --   --   --   --   --   --   --    < > = values in this interval not displayed.   Basic Metabolic Panel: Recent Labs  Lab 02/28/20 1636 02/29/20 0734 03/01/20 0044 03/02/20 1034  NA 141 138  --  137  K 4.7 4.8  --  4.7  CL 109 110  --  106  CO2 25 23  --  25  GLUCOSE 101* 92  --  80  BUN 28* 29*  --  19  CREATININE 1.52* 1.36*  --  1.22  CALCIUM 7.4* 7.1*  --  7.9*  MG  --  1.4* 1.9  --   PHOS  --  2.9  --   --    GFR: Estimated Creatinine Clearance: 75.6 mL/min (by C-G formula based on SCr of 1.22 mg/dL). Liver Function Tests: Recent Labs  Lab 02/28/20 1636  AST 17  ALT 22  ALKPHOS 69  BILITOT 0.4  PROT 5.3*  ALBUMIN 2.4*   No results for input(s): LIPASE, AMYLASE in the last 168 hours. No results for input(s): AMMONIA in the last 168 hours. Coagulation  Profile: Recent Labs  Lab 02/28/20 1636 02/29/20 1641  INR 2.6* 1.3*   Cardiac Enzymes: No results for input(s): CKTOTAL, CKMB, CKMBINDEX, TROPONINI in the last 168 hours. BNP (last 3 results) No results for input(s): PROBNP in the last 8760 hours. HbA1C: Recent Labs    02/29/20 0734  HGBA1C 4.9   CBG: Recent Labs  Lab 03/01/20 0821 03/01/20 1731  03/01/20 2153 03/02/20 0737 03/02/20 1117  GLUCAP 82 67* 114* 101* 82   Lipid Profile: No results for input(s): CHOL, HDL, LDLCALC, TRIG, CHOLHDL, LDLDIRECT in the last 72 hours. Thyroid Function Tests: No results for input(s): TSH, T4TOTAL, FREET4, T3FREE, THYROIDAB in the last 72 hours. Anemia Panel: No results for input(s): VITAMINB12, FOLATE, FERRITIN, TIBC, IRON, RETICCTPCT in the last 72 hours. Sepsis Labs: No results for input(s): PROCALCITON, LATICACIDVEN in the last 168 hours.  Recent Results (from the past 240 hour(s))  Respiratory Panel by RT PCR (Flu A&B, Covid) - Nasopharyngeal Swab     Status: None   Collection Time: 02/28/20  7:51 PM   Specimen: Nasopharyngeal Swab  Result Value Ref Range Status   SARS Coronavirus 2 by RT PCR NEGATIVE NEGATIVE Final    Comment: (NOTE) SARS-CoV-2 target nucleic acids are NOT DETECTED. The SARS-CoV-2 RNA is generally detectable in upper respiratoy specimens during the acute phase of infection. The lowest concentration of SARS-CoV-2 viral copies this assay can detect is 131 copies/mL. A negative result does not preclude SARS-Cov-2 infection and should not be used as the sole basis for treatment or other patient management decisions. A negative result may occur with  improper specimen collection/handling, submission of specimen other than nasopharyngeal swab, presence of viral mutation(s) within the areas targeted by this assay, and inadequate number of viral copies (<131 copies/mL). A negative result must be combined with clinical observations, patient history, and  epidemiological information. The expected result is Negative. Fact Sheet for Patients:  PinkCheek.be Fact Sheet for Healthcare Providers:  GravelBags.it This test is not yet ap proved or cleared by the [place] FDA and  has been authorized for detection and/or diagnosis of SARS-CoV-2 by FDA under an Emergency Use Authorization (EUA). This EUA will remain  in effect (meaning this test can be used) for the duration of the COVID-19 declaration under Section 564(b)(1) of the Act, 21 U.S.C. section 360bbb-3(b)(1), unless the authorization is terminated or revoked sooner.    Influenza A by PCR NEGATIVE NEGATIVE Final   Influenza B by PCR NEGATIVE NEGATIVE Final    Comment: (NOTE) The Xpert Xpress SARS-CoV-2/FLU/RSV assay is intended as an aid in  the diagnosis of influenza from Nasopharyngeal swab specimens and  should not be used as a sole basis for treatment. Nasal washings and  aspirates are unacceptable for Xpert Xpress SARS-CoV-2/FLU/RSV  testing. Fact Sheet for Patients: PinkCheek.be Fact Sheet for Healthcare Providers: GravelBags.it This test is not yet approved or cleared by the [place] FDA and  has been authorized for detection and/or diagnosis of SARS-CoV-2 by  FDA under an Emergency Use Authorization (EUA). This EUA will remain  in effect (meaning this test can be used) for the duration of the  Covid-19 declaration under Section 564(b)(1) of the Act, 21  U.S.C. section 360bbb-3(b)(1), unless the authorization is  terminated or revoked. Performed at [facility], [place] [address], [place], [place] [address]          Radiology Studies: NM GI Blood Loss  Result Date: 03/01/2020 CLINICAL DATA:  GI bleeding EXAM: NUCLEAR MEDICINE GASTROINTESTINAL BLEEDING SCAN TECHNIQUE: Sequential abdominal images were obtained following  intravenous administration of Tc-37m labeled red blood cells. RADIOPHARMACEUTICALS:  21.9 mCi Tc-44m pertechnetate in-vitro labeled red cells. COMPARISON:  None. FINDINGS: Adequate uptake of radiotracer is noted within the spleen and liver as well as the blood pool. Normal excretion is noted bilaterally. No focal area of increased uptake to suggest active hemorrhage is  noted. IMPRESSION: No evidence of active hemorrhage. Electronically Signed   By: Inez Catalina M.D.   On: 03/01/2020 18:57        Scheduled Meds: . DULoxetine  60 mg Oral BID  . ferrous sulfate  325 mg Oral Q breakfast  . gabapentin  300 mg Oral TID  . insulin aspart  0-6 Units Subcutaneous TID WC  . pantoprazole (PROTONIX) IV  40 mg Intravenous Q12H  . polyethylene glycol powder  0.5 Container Oral Once  . sodium chloride (PF)      . sucralfate  1 g Oral Q6H   Continuous Infusions:   LOS: 2 days     Georgette Shell, MD  03/02/2020, 2:42 PM

## 2020-03-02 NOTE — Progress Notes (Signed)
CRITICAL VALUE STICKER  CRITICAL VALUE: HGB 6.5  DATE & TIME NOTIFIED: 03/02/2020  0145  MD NOTIFIED: Sharlet Salina, NP  TIME OF NOTIFICATION: 0147  RESPONSE: 2 units PRBCs ordered

## 2020-03-02 NOTE — Anesthesia Postprocedure Evaluation (Signed)
Anesthesia Post Note  Patient: Keith [name]  Procedure(s) Performed: ESOPHAGOGASTRODUODENOSCOPY (EGD) WITH PROPOFOL (N/A ) COLONOSCOPY WITH PROPOFOL (N/A ) BIOPSY     Patient location during evaluation: Endoscopy Anesthesia Type: MAC Level of consciousness: awake and alert Pain management: pain level controlled Vital Signs Assessment: post-procedure vital signs reviewed and stable Respiratory status: spontaneous breathing, nonlabored ventilation, respiratory function stable and patient connected to nasal cannula oxygen Cardiovascular status: blood pressure returned to baseline and stable Postop Assessment: no apparent nausea or vomiting Anesthetic complications: no    Last Vitals:  Vitals:   03/02/20 0815 03/02/20 1444  BP: (!) 140/91 126/84  Pulse: 75 63  Resp: 15 18  Temp: 36.6 C (!) 36.4 C  SpO2: 98% 97%    Last Pain:  Vitals:   03/02/20 1444  TempSrc: Oral  PainSc:                  [name]

## 2020-03-03 ENCOUNTER — Encounter (HOSPITAL_COMMUNITY)
Admission: EM | Disposition: A | Discharge status: Home / Self Care | Payer: Self-pay | Source: Home / Self Care | Attending: Internal Medicine

## 2020-03-03 DIAGNOSIS — K921 Melena: Secondary | ICD-10-CM

## 2020-03-03 HISTORY — PX: GIVENS CAPSULE STUDY: SHX5432

## 2020-03-03 LAB — MAGNESIUM: Magnesium: 1.9 mg/dL (ref 1.7–2.4)

## 2020-03-03 LAB — GLUCOSE, CAPILLARY
Glucose-Capillary: 92 mg/dL (ref 70–99)
Glucose-Capillary: 75 mg/dL (ref 70–99)
Glucose-Capillary: 111 mg/dL — ABNORMAL HIGH (ref 70–99)
Glucose-Capillary: 98 mg/dL (ref 70–99)

## 2020-03-03 LAB — HEMOGLOBIN AND HEMATOCRIT, BLOOD
HCT: 29.9 % — ABNORMAL LOW (ref 39.0–52.0)
HCT: 29 % — ABNORMAL LOW (ref 39.0–52.0)
HCT: 29 % — ABNORMAL LOW (ref 39.0–52.0)
HCT: 29.1 % — ABNORMAL LOW (ref 39.0–52.0)
Hemoglobin: 9 g/dL — ABNORMAL LOW (ref 13.0–17.0)
Hemoglobin: 8.6 g/dL — ABNORMAL LOW (ref 13.0–17.0)
Hemoglobin: 8.7 g/dL — ABNORMAL LOW (ref 13.0–17.0)
Hemoglobin: 8.9 g/dL — ABNORMAL LOW (ref 13.0–17.0)

## 2020-03-03 SURGERY — IMAGING PROCEDURE, GI TRACT, INTRALUMINAL, VIA CAPSULE
Anesthesia: LOCAL

## 2020-03-03 SURGICAL SUPPLY — 1 items: TOWEL COTTON PACK 4EA (MISCELLANEOUS) ×4 IMPLANT

## 2020-03-03 NOTE — Progress Notes (Signed)
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 Last GI bleed was in September 2020, patient had an EGD that revealed grade C esophagitis and nonbleeding erosive gastropathy. Patient experienced near syncopal episode while visiting his primary care provider earlier today. Is not clear whether patient actually passed out. Patient was advised to come to the hospital for further assessment and management. On presentation to the hospital, patient's hemoglobin was found to be 5.3 g/dL, down from 8.3 g/dL. Platelet count is 113, INR is 2.6 with positive fecal occult blood. No headache, no neck pain, no chest pain, no GI symptoms and no urinary symptoms. Hospitalist team has been asked to admit patient for further assessment and management.  ED Course:On presentation to the hospital, this revealed temperature of 97.4, blood pressure of 80-140/50-79 mmHg, heart rate of 72 bpm, respiratory rate of 14 and O2 sat of 99%. Work-up done so far is as documented above. Patient will be transfused with 2 units of packed red blood cells. ER provider has consulted the GI consultant on-call, Dr. Hilarie Fredrickson.  Assessment & Plan:   Active Problems:   Symptomatic anemia   Hematochezia   Acute GI bleeding   Acute blood loss anemia   #1 GI bleed-egd-reflux esophagitis, gastritis with hemorrhage biopsied. Colonoscopy blood in the terminal ileum entire examined colon and nonbleeding internal hemorrhoids. Tagged RBC scan with no evidence of active bleeding.  CT angiogram of the abdomen and pelvis ordered by GI.  Continue to hold Xarelto. Follow-up H&H. CT angiogram of the abdomen no  evidence of acute arterial bleeding. Capsule endoscopy in process today  #2 acute on chronic anemia hemoglobin  stable after transfusion.  No further bleeding overnight.    #3 history of pulmonary embolism on Xarelto continue to hold  #4 history of renal cell carcinoma metastatic status post nephrectomy radiotherapy followed by Dr. [name]  #5 chronic pain has a pain pump in place left lower quadrant  #6 chronic diarrhea question secondary to recent antibiotics versus chemotherapy  #7 elevated INR unclear reason on Xarelto which is on hold but that should not affect his INR. Liver functions otherwise okay.  #8 hypomagnesemia repleted magnesium 1.9.    Estimated body mass index is 20.8 kg/m as calculated from the following:   Height as of this encounter: 6\' 3"  (1.905 m).   Weight as of this encounter: 75.5 kg. DVT prophylaxis:SCD  code Status:Full code Family Communication:dw wife Disposition Plan:Patient came from home plan will be to discharge home once the GI bleeding is resolved and work-up is completed. Consultants:  GI  Procedures:None Antimicrobials:None  Subjective:  No nausea vomiting, no bowel movements tolerating clear liquids  Objective: Vitals:   03/02/20 1957 03/03/20 0351 03/03/20 0821 03/03/20 1219  BP: 124/90 129/88 128/80 120/79  Pulse: 68 65 61 (!) 57  Resp: 18 18 15 17   Temp: (!) 97.5 F (36.4 C) 98 F (36.7 C) 97.6 F (36.4 C) (!) 97.4 F (36.3 C)  TempSrc: Oral Oral Oral Oral  SpO2: 97% (!) 88% 100% 98%  Weight:      Height:        Intake/Output Summary (Last 24 hours) at 03/03/2020 1223 Last data filed at 03/02/2020 1300 Gross  per 24 hour  Intake 0 ml  Output -  Net 0 ml   Filed Weights   02/28/20 1628 03/01/20 1011  Weight: 75.5 kg 75.5 kg    Examination:  General exam: Appears calm and comfortable  Respiratory system: Clear to auscultation. Respiratory effort normal. Cardiovascular system: S1 & S2 heard, RRR.  No JVD, murmurs, rubs, gallops or clicks. No pedal edema. Gastrointestinal system: Abdomen is nondistended, soft and nontender. No organomegaly or masses felt. Normal bowel sounds heard.  Pain pump left lower quadrant Central nervous system: Alert and oriented. No focal neurological deficits. Extremities: Symmetric 5 x 5 power. Skin: No rashes, lesions or ulcers Psychiatry: Judgement and insight appear normal. Mood & affect appropriate.     Data Reviewed: I have personally reviewed following labs and imaging studies  CBC: Recent Labs  Lab 02/28/20 1636 02/28/20 1636 02/29/20 0734 02/29/20 1017 03/02/20 1034 03/02/20 1549 03/02/20 2210 03/03/20 0538 03/03/20 1027  WBC 5.4  --  5.6  --   --   --   --   --   --   NEUTROABS 4.1  --   --   --   --   --   --   --   --   HGB 5.3*   < > 6.8*   < > 8.6* 8.4* 8.2* 9.0* 8.6*  HCT 18.5*   < > 22.6*   < > 28.0* 27.7* 27.1* 29.9* 29.0*  MCV 109.5*  --  100.4*  --   --   --   --   --   --   PLT 113*  --  98*  --   --   --   --   --   --    < > = values in this interval not displayed.   Basic Metabolic Panel: Recent Labs  Lab 02/28/20 1636 02/29/20 0734 03/01/20 0044 03/02/20 1034 03/03/20 0538  NA 141 138  --  137  --   K 4.7 4.8  --  4.7  --   CL 109 110  --  106  --   CO2 25 23  --  25  --   GLUCOSE 101* 92  --  80  --   BUN 28* 29*  --  19  --   CREATININE 1.52* 1.36*  --  1.22  --   CALCIUM 7.4* 7.1*  --  7.9*  --   MG  --  1.4* 1.9  --  1.9  PHOS  --  2.9  --   --   --    GFR: Estimated Creatinine Clearance: 75.6 mL/min (by C-G formula based on SCr of 1.22 mg/dL). Liver Function Tests: Recent Labs  Lab 02/28/20 1636  AST 17  ALT 22  ALKPHOS 69  BILITOT 0.4  PROT 5.3*  ALBUMIN 2.4*   No results for input(s): LIPASE, AMYLASE in the last 168 hours. No results for input(s): AMMONIA in the last 168 hours. Coagulation Profile: Recent Labs  Lab 02/28/20 1636 02/29/20 1641  INR 2.6* 1.3*   Cardiac Enzymes: No  results for input(s): CKTOTAL, CKMB, CKMBINDEX, TROPONINI in the last 168 hours. BNP (last 3 results) No results for input(s): PROBNP in the last 8760 hours. HbA1C: No results for input(s): HGBA1C in the last 72 hours. CBG: Recent Labs  Lab 03/02/20 1117 03/02/20 1846 03/02/20 2010 03/03/20 0740 03/03/20 1114  GLUCAP 82 88 106* 92 75   Lipid Profile: No results for input(s): CHOL, HDL, LDLCALC, TRIG,  CHOLHDL, LDLDIRECT in the last 72 hours. Thyroid Function Tests: No results for input(s): TSH, T4TOTAL, FREET4, T3FREE, THYROIDAB in the last 72 hours. Anemia Panel: No results for input(s): VITAMINB12, FOLATE, FERRITIN, TIBC, IRON, RETICCTPCT in the last 72 hours. Sepsis Labs: No results for input(s): PROCALCITON, LATICACIDVEN in the last 168 hours.  Recent Results (from the past 240 hour(s))  Respiratory Panel by RT PCR (Flu A&B, Covid) - Nasopharyngeal Swab     Status: None   Collection Time: 02/28/20  7:51 PM   Specimen: Nasopharyngeal Swab  Result Value Ref Range Status   SARS Coronavirus 2 by RT PCR NEGATIVE NEGATIVE Final    Comment: (NOTE) SARS-CoV-2 target nucleic acids are NOT DETECTED. The SARS-CoV-2 RNA is generally detectable in upper respiratoy specimens during the acute phase of infection. The lowest concentration of SARS-CoV-2 viral copies this assay can detect is 131 copies/mL. A negative result does not preclude SARS-Cov-2 infection and should not be used as the sole basis for treatment or other patient management decisions. A negative result may occur with  improper specimen collection/handling, submission of specimen other than nasopharyngeal swab, presence of viral mutation(s) within the areas targeted by this assay, and inadequate number of viral copies (<131 copies/mL). A negative result must be combined with clinical observations, patient history, and epidemiological information. The expected result is Negative. Fact Sheet for Patients:   PinkCheek.be Fact Sheet for Healthcare Providers:  GravelBags.it This test is not yet ap proved or cleared by the [place] FDA and  has been authorized for detection and/or diagnosis of SARS-CoV-2 by FDA under an Emergency Use Authorization (EUA). This EUA will remain  in effect (meaning this test can be used) for the duration of the COVID-19 declaration under Section 564(b)(1) of the Act, 21 U.S.C. section 360bbb-3(b)(1), unless the authorization is terminated or revoked sooner.    Influenza A by PCR NEGATIVE NEGATIVE Final   Influenza B by PCR NEGATIVE NEGATIVE Final    Comment: (NOTE) The Xpert Xpress SARS-CoV-2/FLU/RSV assay is intended as an aid in  the diagnosis of influenza from Nasopharyngeal swab specimens and  should not be used as a sole basis for treatment. Nasal washings and  aspirates are unacceptable for Xpert Xpress SARS-CoV-2/FLU/RSV  testing. Fact Sheet for Patients: PinkCheek.be Fact Sheet for Healthcare Providers: GravelBags.it This test is not yet approved or cleared by the [place] FDA and  has been authorized for detection and/or diagnosis of SARS-CoV-2 by  FDA under an Emergency Use Authorization (EUA). This EUA will remain  in effect (meaning this test can be used) for the duration of the  Covid-19 declaration under Section 564(b)(1) of the Act, 21  U.S.C. section 360bbb-3(b)(1), unless the authorization is  terminated or revoked. Performed at [facility], [place] [address] [place], [place] [address]          Radiology Studies: NM GI Blood Loss  Result Date: 03/01/2020 CLINICAL DATA:  GI bleeding EXAM: NUCLEAR MEDICINE GASTROINTESTINAL BLEEDING SCAN TECHNIQUE: Sequential abdominal images were obtained following intravenous administration of Tc-90m labeled red blood cells. RADIOPHARMACEUTICALS:  21.9 mCi  Tc-54m pertechnetate in-vitro labeled red cells. COMPARISON:  None. FINDINGS: Adequate uptake of radiotracer is noted within the spleen and liver as well as the blood pool. Normal excretion is noted bilaterally. No focal area of increased uptake to suggest active hemorrhage is noted. IMPRESSION: No evidence of active hemorrhage. Electronically Signed   By: Inez Catalina M.D.   On: 03/01/2020 18:57   CT Angio  Abd/Pel w/ and/or w/o  Result Date: 03/02/2020 CLINICAL DATA:  54 year old male with a history of possible GI bleeding EXAM: CTA ABDOMEN AND PELVIS WITHOUT AND WITH CONTRAST TECHNIQUE: Multidetector CT imaging of the abdomen and pelvis was performed using the standard protocol during bolus administration of intravenous contrast. Multiplanar reconstructed images and MIPs were obtained and reviewed to evaluate the vascular anatomy. CONTRAST:  131mL OMNIPAQUE IOHEXOL 350 MG/ML SOLN COMPARISON:  Nuclear medicine bleeding study 03/01/2020, CT 12/20/2019 FINDINGS: VASCULAR Aorta: Minimal atherosclerosis of the abdominal aorta. Normal course caliber and contour with no dissection, or aneurysm. There is shallow irregular plaque/ulcerated plaque with no inflammatory changes just above the IMA origin. No diameter change of the aorta. Celiac: Patent, with no significant atherosclerotic changes. SMA: Patent, with no significant atherosclerotic changes. Renals: Single right renal artery.  No significant atherosclerotic changes. Surgical changes of left renal artery ligation. IMA: IMA patent. Right lower extremity: Unremarkable course, caliber, and contour of the right iliac system. Mild atherosclerosis. No aneurysm, dissection, or occlusion. Hypogastric artery is patent. Common femoral artery patent. Proximal SFA and profunda femoris patent. Left lower extremity: Unremarkable course, caliber, and contour of the left iliac system. Mild atherosclerosis. No aneurysm, dissection, or occlusion. Hypogastric artery is patent.  Common femoral artery patent. Proximal SFA and profunda femoris patent. Veins: Unremarkable appearance of the venous system. Review of the MIP images confirms the above findings. NON-VASCULAR Lower chest: No acute. Hepatobiliary: Unremarkable appearance of the liver. Unremarkable gall bladder. Pancreas: Redemonstration of hypervascular masses involving the pancreatic head and the head/neck junction. The largest at the junction is essentially unchanged measuring 2.5 cm. This does have circumferential involvement of the local gastroduodenal artery and gastroepiploic artery, as well as likely the superior pancreaticoduodenal arcade. Similar degree duct dilation of the body and tail with developing atrophy distally. No local inflammatory changes. Spleen: Unremarkable. Adrenals/Urinary Tract: Unremarkable appearance of adrenal glands. Right: No hydronephrosis. Symmetric perfusion to the left. No nephrolithiasis. Unremarkable course of the right ureter. Left: Left nephrectomy Unilateral ir partially distended. Stomach/Bowel: Unremarkable appearance of the stomach. Unremarkable appearance of small bowel. No evidence of obstruction. Normal appendix. Fluid-filled proximal colon. No focal inflammatory changes or wall thickening. No extravasation of contrast. No pooling or puddling of contrast identified. Lymphatic: No adenopathy. Mesenteric: No intraperitoneal free fluid or hemorrhage. No mesenteric adenopathy. Reproductive: Unremarkable appearance of the pelvic organs. Other: Generator on the left anterior abdominal wall with lead terminating within the spinal canal out of the field of view. Musculoskeletal: No acute displaced fracture.  Degenerative changes. Lucent lesion involving the right aspect of the sacrum is relatively unchanged over time dating to the CT 07/07/2018. Lucent lesion with sclerotic border in the inferior pubic ramus on the right, unchanged. Surgical changes of proximal right femur. IMPRESSION: No acute  arterial abnormality, with no evidence of gastrointestinal hemorrhage. Aortic Atherosclerosis (ICD10-I70.0). Unchanged appearance of hypervascular pancreatic lesions. Additional ancillary findings as above. Electronically Signed   By: Corrie Mckusick D.O.   On: 03/02/2020 14:47        Scheduled Meds: . DULoxetine  60 mg Oral BID  . ferrous sulfate  325 mg Oral Q breakfast  . gabapentin  300 mg Oral TID  . insulin aspart  0-6 Units Subcutaneous TID WC  . pantoprazole (PROTONIX) IV  40 mg Intravenous Q12H  . sucralfate  1 g Oral Q6H   Continuous Infusions: . sodium chloride       LOS: 3 days     Georgette Shell, MD  03/03/2020, 12:23 PM

## 2020-03-03 NOTE — Progress Notes (Addendum)
For capsule endoscopy later this AM.  Will read tomorrow.  Has not passed any stool/blood since colonoscopy prep, not even after drinking the half Miralax prep last evening.  Hgb stable/improved at 9.0 grams this AM.

## 2020-03-04 ENCOUNTER — Encounter: Payer: Self-pay | Admitting: *Deleted

## 2020-03-04 LAB — BPAM RBC
Blood Product Expiration Date: 202103242359
Blood Product Expiration Date: 202103262359
Blood Product Expiration Date: 202103272359
Blood Product Expiration Date: 202104062359
ISSUE DATE / TIME: 202103092010
ISSUE DATE / TIME: 202103100116
ISSUE DATE / TIME: 202103120306
ISSUE DATE / TIME: 202103120550
Unit Type and Rh: 600
Unit Type and Rh: 600
Unit Type and Rh: 600
Unit Type and Rh: 600

## 2020-03-04 LAB — TYPE AND SCREEN
ABO/RH(D): A NEG
Antibody Screen: NEGATIVE
Unit division: 0
Unit division: 0
Unit division: 0
Unit division: 0

## 2020-03-04 LAB — GLUCOSE, CAPILLARY
Glucose-Capillary: 97 mg/dL (ref 70–99)
Glucose-Capillary: 112 mg/dL — ABNORMAL HIGH (ref 70–99)
Glucose-Capillary: 78 mg/dL (ref 70–99)

## 2020-03-04 LAB — HEMOGLOBIN AND HEMATOCRIT, BLOOD
HCT: 29.3 % — ABNORMAL LOW (ref 39.0–52.0)
HCT: 28.7 % — ABNORMAL LOW (ref 39.0–52.0)
Hemoglobin: 8.8 g/dL — ABNORMAL LOW (ref 13.0–17.0)
Hemoglobin: 8.6 g/dL — ABNORMAL LOW (ref 13.0–17.0)

## 2020-03-04 MED ORDER — RIVAROXABAN 20 MG PO TABS
20.0000 mg | ORAL_TABLET | Freq: Every day | ORAL | Status: DC
  Administered 2020-03-04: 20 mg via ORAL
  Filled 2020-03-04: qty 1

## 2020-03-04 NOTE — Progress Notes (Signed)
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 Last GI bleed was in September 2020, patient had an EGD that revealed grade C esophagitis and nonbleeding erosive gastropathy. Patient experienced near syncopal episode while visiting his primary care provider earlier today. Is not clear whether patient actually passed out. Patient was advised to come to the hospital for further assessment and management. On presentation to the hospital, patient's hemoglobin was found to be 5.3 g/dL, down from 8.3 g/dL. Platelet count is 113, INR is 2.6 with positive fecal occult blood. No headache, no neck pain, no chest pain, no GI symptoms and no urinary symptoms. Hospitalist team has been asked to admit patient for further assessment and management.  ED Course:On presentation to the hospital, this revealed temperature of 97.4, blood pressure of 80-140/50-79 mmHg, heart rate of 72 bpm, respiratory rate of 14 and O2 sat of 99%. Work-up done so far is as documented above. Patient will be transfused with 2 units of packed red blood cells. ER provider has consulted the GI consultant on-call, Dr. Hilarie Fredrickson.  Assessment & Plan:   Active Problems:   Symptomatic anemia   Hematochezia   Acute GI bleeding   Acute blood loss anemia   #1 GI bleed-egd-reflux esophagitis, gastritis with hemorrhage biopsied. Colonoscopy blood in the terminal ileum entire examined colon and nonbleeding internal hemorrhoids. Tagged RBC scan with no evidence of active bleeding. CT angiogram of the abdomen and pelvis ordered by GI. Continue to hold Xarelto. Follow-up H&H. CT angiogram of the abdomen no evidence  of acute arterial bleeding. Capsule endoscopy small amount of heme noted in the stomach could be sites of biopsy during EGD or friable mucosa with gastritis Will restart Xarelto today and monitor closely CBC in a.m. Hemoglobin stable  #2 acute on chronic anemia hemoglobin stable after transfusion.  No further bleeding overnight.    #3 history of pulmonary embolism on Xarelto start  #4 history of renal cell carcinoma metastatic status post nephrectomy radiotherapy followed by Dr. [name]  #5 chronic pain has a pain pump in place left lower quadrant  #6 chronic diarrhea question secondary to recent antibiotics versus chemotherapy  #7 elevated INR unclear reason on Xarelto which is on hold but that should not affect his INR. Liver functions otherwise okay.  #8 hypomagnesemia repleted magnesium 1.9.     Estimated body mass index is 20.8 kg/m as calculated from the following:   Height as of this encounter: 6\' 3"  (1.905 m).   Weight as of this encounter: 75.5 kg.   Subjective: He is resting in bed he has no new complaints no further vomiting or bowel movements no abdominal pain tolerating food  Objective: Vitals:   03/03/20 0821 03/03/20 1219 03/03/20 2037 03/04/20 0519  BP: 128/80 120/79 129/88 125/80  Pulse: 61 (!) 57 69 66  Resp: 15 17 16 16   Temp: 97.6 F (36.4 C) (!) 97.4 F (36.3 C) 98.1 F (36.7 C) 97.8 F (36.6 C)  TempSrc: Oral Oral Oral Oral  SpO2: 100% 98% 97% 96%  Weight:      Height:        Intake/Output Summary (Last 24 hours) at 03/04/2020 1156 Last data filed at 03/04/2020 0926 Gross per 24  hour  Intake 480 ml  Output --  Net 480 ml   Filed Weights   02/28/20 1628 03/01/20 1011  Weight: 75.5 kg 75.5 kg    Examination:  General exam: Appears calm and comfortable  Respiratory system: Clear to auscultation. Respiratory effort normal. Cardiovascular system: S1 & S2 heard, RRR. No JVD, murmurs, rubs, gallops or clicks. No pedal  edema. Gastrointestinal system: Abdomen is nondistended, soft and nontender. No organomegaly or masses felt. Normal bowel sounds heard. Central nervous system: Alert and oriented. No focal neurological deficits. Extremities: Symmetric 5 x 5 power. Skin: No rashes, lesions or ulcers Psychiatry: Judgement and insight appear normal. Mood & affect appropriate.     Data Reviewed: I have personally reviewed following labs and imaging studies  CBC: Recent Labs  Lab 02/28/20 1636 02/28/20 1636 02/29/20 0734 02/29/20 1017 03/03/20 1027 03/03/20 1642 03/03/20 2230 03/04/20 0515 03/04/20 1108  WBC 5.4  --  5.6  --   --   --   --   --   --   NEUTROABS 4.1  --   --   --   --   --   --   --   --   HGB 5.3*   < > 6.8*   < > 8.6* 8.7* 8.9* 8.8* 8.6*  HCT 18.5*   < > 22.6*   < > 29.0* 29.0* 29.1* 29.3* 28.7*  MCV 109.5*  --  100.4*  --   --   --   --   --   --   PLT 113*  --  98*  --   --   --   --   --   --    < > = values in this interval not displayed.   Basic Metabolic Panel: Recent Labs  Lab 02/28/20 1636 02/29/20 0734 03/01/20 0044 03/02/20 1034 03/03/20 0538  NA 141 138  --  137  --   K 4.7 4.8  --  4.7  --   CL 109 110  --  106  --   CO2 25 23  --  25  --   GLUCOSE 101* 92  --  80  --   BUN 28* 29*  --  19  --   CREATININE 1.52* 1.36*  --  1.22  --   CALCIUM 7.4* 7.1*  --  7.9*  --   MG  --  1.4* 1.9  --  1.9  PHOS  --  2.9  --   --   --    GFR: Estimated Creatinine Clearance: 75.6 mL/min (by C-G formula based on SCr of 1.22 mg/dL). Liver Function Tests: Recent Labs  Lab 02/28/20 1636  AST 17  ALT 22  ALKPHOS 69  BILITOT 0.4  PROT 5.3*  ALBUMIN 2.4*   No results for input(s): LIPASE, AMYLASE in the last 168 hours. No results for input(s): AMMONIA in the last 168 hours. Coagulation Profile: Recent Labs  Lab 02/28/20 1636 02/29/20 1641  INR 2.6* 1.3*   Cardiac Enzymes: No results for input(s): CKTOTAL, CKMB, CKMBINDEX, TROPONINI in the last 168  hours. BNP (last 3 results) No results for input(s): PROBNP in the last 8760 hours. HbA1C: No results for input(s): HGBA1C in the last 72 hours. CBG: Recent Labs  Lab 03/03/20 1114 03/03/20 1631 03/03/20 2038 03/04/20 0732 03/04/20 1107  GLUCAP 75 111* 98 97 112*   Lipid Profile: No results for input(s): CHOL, HDL, LDLCALC, TRIG, CHOLHDL, LDLDIRECT in the last 72 hours. Thyroid  Function Tests: No results for input(s): TSH, T4TOTAL, FREET4, T3FREE, THYROIDAB in the last 72 hours. Anemia Panel: No results for input(s): VITAMINB12, FOLATE, FERRITIN, TIBC, IRON, RETICCTPCT in the last 72 hours. Sepsis Labs: No results for input(s): PROCALCITON, LATICACIDVEN in the last 168 hours.  Recent Results (from the past 240 hour(s))  Respiratory Panel by RT PCR (Flu A&B, Covid) - Nasopharyngeal Swab     Status: None   Collection Time: 02/28/20  7:51 PM   Specimen: Nasopharyngeal Swab  Result Value Ref Range Status   SARS Coronavirus 2 by RT PCR NEGATIVE NEGATIVE Final    Comment: (NOTE) SARS-CoV-2 target nucleic acids are NOT DETECTED. The SARS-CoV-2 RNA is generally detectable in upper respiratoy specimens during the acute phase of infection. The lowest concentration of SARS-CoV-2 viral copies this assay can detect is 131 copies/mL. A negative result does not preclude SARS-Cov-2 infection and should not be used as the sole basis for treatment or other patient management decisions. A negative result may occur with  improper specimen collection/handling, submission of specimen other than nasopharyngeal swab, presence of viral mutation(s) within the areas targeted by this assay, and inadequate number of viral copies (<131 copies/mL). A negative result must be combined with clinical observations, patient history, and epidemiological information. The expected result is Negative. Fact Sheet for Patients:  PinkCheek.be Fact Sheet for Healthcare Providers:   GravelBags.it This test is not yet ap proved or cleared by the [place] FDA and  has been authorized for detection and/or diagnosis of SARS-CoV-2 by FDA under an Emergency Use Authorization (EUA). This EUA will remain  in effect (meaning this test can be used) for the duration of the COVID-19 declaration under Section 564(b)(1) of the Act, 21 U.S.C. section 360bbb-3(b)(1), unless the authorization is terminated or revoked sooner.    Influenza A by PCR NEGATIVE NEGATIVE Final   Influenza B by PCR NEGATIVE NEGATIVE Final    Comment: (NOTE) The Xpert Xpress SARS-CoV-2/FLU/RSV assay is intended as an aid in  the diagnosis of influenza from Nasopharyngeal swab specimens and  should not be used as a sole basis for treatment. Nasal washings and  aspirates are unacceptable for Xpert Xpress SARS-CoV-2/FLU/RSV  testing. Fact Sheet for Patients: PinkCheek.be Fact Sheet for Healthcare Providers: GravelBags.it This test is not yet approved or cleared by the [place] FDA and  has been authorized for detection and/or diagnosis of SARS-CoV-2 by  FDA under an Emergency Use Authorization (EUA). This EUA will remain  in effect (meaning this test can be used) for the duration of the  Covid-19 declaration under Section 564(b)(1) of the Act, 21  U.S.C. section 360bbb-3(b)(1), unless the authorization is  terminated or revoked. Performed at [facility], [place] [address], [place], [place] [address]          Radiology Studies: CT Angio Abd/Pel w/ and/or w/o  Result Date: 03/02/2020 CLINICAL DATA:  54 year old male with a history of possible GI bleeding EXAM: CTA ABDOMEN AND PELVIS WITHOUT AND WITH CONTRAST TECHNIQUE: Multidetector CT imaging of the abdomen and pelvis was performed using the standard protocol during bolus administration of intravenous contrast. Multiplanar reconstructed  images and MIPs were obtained and reviewed to evaluate the vascular anatomy. CONTRAST:  167mL OMNIPAQUE IOHEXOL 350 MG/ML SOLN COMPARISON:  Nuclear medicine bleeding study 03/01/2020, CT 12/20/2019 FINDINGS: VASCULAR Aorta: Minimal atherosclerosis of the abdominal aorta. Normal course caliber and contour with no dissection, or aneurysm. There is shallow irregular plaque/ulcerated plaque with no inflammatory changes just above  the IMA origin. No diameter change of the aorta. Celiac: Patent, with no significant atherosclerotic changes. SMA: Patent, with no significant atherosclerotic changes. Renals: Single right renal artery.  No significant atherosclerotic changes. Surgical changes of left renal artery ligation. IMA: IMA patent. Right lower extremity: Unremarkable course, caliber, and contour of the right iliac system. Mild atherosclerosis. No aneurysm, dissection, or occlusion. Hypogastric artery is patent. Common femoral artery patent. Proximal SFA and profunda femoris patent. Left lower extremity: Unremarkable course, caliber, and contour of the left iliac system. Mild atherosclerosis. No aneurysm, dissection, or occlusion. Hypogastric artery is patent. Common femoral artery patent. Proximal SFA and profunda femoris patent. Veins: Unremarkable appearance of the venous system. Review of the MIP images confirms the above findings. NON-VASCULAR Lower chest: No acute. Hepatobiliary: Unremarkable appearance of the liver. Unremarkable gall bladder. Pancreas: Redemonstration of hypervascular masses involving the pancreatic head and the head/neck junction. The largest at the junction is essentially unchanged measuring 2.5 cm. This does have circumferential involvement of the local gastroduodenal artery and gastroepiploic artery, as well as likely the superior pancreaticoduodenal arcade. Similar degree duct dilation of the body and tail with developing atrophy distally. No local inflammatory changes. Spleen: Unremarkable.  Adrenals/Urinary Tract: Unremarkable appearance of adrenal glands. Right: No hydronephrosis. Symmetric perfusion to the left. No nephrolithiasis. Unremarkable course of the right ureter. Left: Left nephrectomy Unilateral ir partially distended. Stomach/Bowel: Unremarkable appearance of the stomach. Unremarkable appearance of small bowel. No evidence of obstruction. Normal appendix. Fluid-filled proximal colon. No focal inflammatory changes or wall thickening. No extravasation of contrast. No pooling or puddling of contrast identified. Lymphatic: No adenopathy. Mesenteric: No intraperitoneal free fluid or hemorrhage. No mesenteric adenopathy. Reproductive: Unremarkable appearance of the pelvic organs. Other: Generator on the left anterior abdominal wall with lead terminating within the spinal canal out of the field of view. Musculoskeletal: No acute displaced fracture.  Degenerative changes. Lucent lesion involving the right aspect of the sacrum is relatively unchanged over time dating to the CT 07/07/2018. Lucent lesion with sclerotic border in the inferior pubic ramus on the right, unchanged. Surgical changes of proximal right femur. IMPRESSION: No acute arterial abnormality, with no evidence of gastrointestinal hemorrhage. Aortic Atherosclerosis (ICD10-I70.0). Unchanged appearance of hypervascular pancreatic lesions. Additional ancillary findings as above. Electronically Signed   By: Corrie Mckusick D.O.   On: 03/02/2020 14:47        Scheduled Meds: . DULoxetine  60 mg Oral BID  . ferrous sulfate  325 mg Oral Q breakfast  . gabapentin  300 mg Oral TID  . insulin aspart  0-6 Units Subcutaneous TID WC  . pantoprazole (PROTONIX) IV  40 mg Intravenous Q12H  . sucralfate  1 g Oral Q6H   Continuous Infusions: . sodium chloride       LOS: 4 days     Georgette Shell, MD 03/04/2020, 11:56 AM

## 2020-03-04 NOTE — Progress Notes (Addendum)
[facility] ROUNDING NOTE   Subjective: No bowel movement since colonoscopy.  Hemoglobin remained stable   Objective: Vital signs in last 24 hours: Temp:  [97.8 F (36.6 C)-98.1 F (36.7 C)] 97.8 F (36.6 C) (03/14 0519) Pulse Rate:  [66-69] 66 (03/14 0519) Resp:  [16] 16 (03/14 0519) BP: (125-129)/(80-88) 125/80 (03/14 0519) SpO2:  [96 %-97 %] 96 % (03/14 0519) Last BM Date: 03/03/20  Intake/Output from previous day: 03/13 0701 - 03/14 0700 In: 240 [P.O.:240] Out: -  Intake/Output this shift: Total I/O In: 240 [P.O.:240] Out: -    Lab Results: Recent Labs    03/03/20 2230 03/04/20 0515 03/04/20 1108  HGB 8.9* 8.8* 8.6*   BMET Recent Labs    03/02/20 1034  NA 137  K 4.7  CL 106  CO2 25  GLUCOSE 80  BUN 19  CREATININE 1.22  CALCIUM 7.9*   LFT No results for input(s): PROT, ALBUMIN, AST, ALT, ALKPHOS, BILITOT, BILIDIR, IBILI in the last 72 hours. PT/INR No results for input(s): INR in the last 72 hours.    Imaging/Other results: No results found.    Assessment &Plan  54 year old male with metastatic renal cell carcinoma on oral chemotherapy and chronic anticoagulation with Xarelto presented with severe anemia and hematochezia  S/p EGD findings of esophagitis and gastritis, noted friable mucosa in gastric fundus, biopsied.  Pathology pending  TI and entire colon with dark blood, no obvious source  Small bowel video capsule inadequate with poor visualization, incomplete, didn't appear to have exit small bowel.  Small amount of heme noted in stomach could be sites of biopsy during EGD/friable mucosa with gastritis No active bleeding  Abd X-ray tomorrow AM to locate the capsule  Slow motility likely  secondary to chronic narcotics  Okay to restart Xarelto Monitor CBC daily  Continue Protonix 40 mg twice daily  If no further evidence of active GI bleed and stable hemoglobin, okay to discharge home tomorrow  If has evidence of  ongoing bleed, will plan for enteroscopy as inpatient  Follow-up CBC as outpatient in 1 week and GI office follow-up 2 weeks  Dr. Hilarie Fredrickson will  assume his care and GI inpatient service tomorrow.  Damaris Hippo , MD [phone]  [facility]

## 2020-03-05 ENCOUNTER — Other Ambulatory Visit: Payer: Self-pay

## 2020-03-05 ENCOUNTER — Telehealth: Payer: Self-pay

## 2020-03-05 ENCOUNTER — Inpatient Hospital Stay (HOSPITAL_COMMUNITY): Payer: Medicare Other

## 2020-03-05 DIAGNOSIS — R195 Other fecal abnormalities: Secondary | ICD-10-CM

## 2020-03-05 DIAGNOSIS — K922 Gastrointestinal hemorrhage, unspecified: Secondary | ICD-10-CM

## 2020-03-05 LAB — CBC
HCT: 29.2 % — ABNORMAL LOW (ref 39.0–52.0)
Hemoglobin: 8.7 g/dL — ABNORMAL LOW (ref 13.0–17.0)
MCH: 30.4 pg (ref 26.0–34.0)
MCHC: 29.8 g/dL — ABNORMAL LOW (ref 30.0–36.0)
MCV: 102.1 fL — ABNORMAL HIGH (ref 80.0–100.0)
Platelets: 109 10*3/uL — ABNORMAL LOW (ref 150–400)
RBC: 2.86 MIL/uL — ABNORMAL LOW (ref 4.22–5.81)
RDW: 20.1 % — ABNORMAL HIGH (ref 11.5–15.5)
WBC: 3.9 10*3/uL — ABNORMAL LOW (ref 4.0–10.5)
nRBC: 0 % (ref 0.0–0.2)

## 2020-03-05 LAB — GLUCOSE, CAPILLARY
Glucose-Capillary: 107 mg/dL — ABNORMAL HIGH (ref 70–99)
Glucose-Capillary: 85 mg/dL (ref 70–99)

## 2020-03-05 LAB — SURGICAL PATHOLOGY

## 2020-03-05 MED ORDER — PANTOPRAZOLE SODIUM 40 MG PO TBEC: 40.0000 mg | DELAYED_RELEASE_TABLET | Freq: Two times a day (BID) | ORAL | 1 refills | Status: DC

## 2020-03-05 NOTE — Telephone Encounter (Signed)
-----   Message from Keith Ferguson, PA-C sent at 03/05/2020  4:08 PM EDT ----- Regarding: RE: labs and follow up Keith [name], I think he needs to have a weekly CBC done between now and whenever his office visit is with Janett Billow, as increased risk for ongoing bleeding.  Please enter order for weekly CBC, and if you could call the patient tomorrow and let him know that he is scheduled for these that would be great thank you-he is aware to come on Thursday but not beyond that ----- Message ----- From: Timothy Lasso, RN Sent: 03/05/2020   1:46 PM EDT To: Keith Ferguson, PA-C Subject: RE: labs and follow up                         I have the lab order in Epic and an appt made to see Janett Billow on 4/6 at 830 am.   ----- Message ----- From: Keith Ferguson, PA-C Sent: 03/05/2020   1:25 PM EDT To: Keith Banister, MD, Timothy Lasso, RN Subject: labs and follow up                             This patient has been admitted over the past several days with GI bleeding in setting of chronic anticoagulation.  He had EGD with friable gastritis, colonoscopy with finding of a lot of dark red blood in the colon but no active bleeding and somewhat poor prep.  Capsule endoscopy was done over the weekend with no source identified, fair prep.  Hemoglobin has been stable over the past 3 days at 8.7  Be discharged today, back on Xarelto  Keith [name] please put in order for labs to be done on Thursday morning results to Dr. Ardis Hughs. You probably need weekly hemoglobins over the next several weeks to assure stability.  You will also need office follow-up with Dr. Ardis Hughs in 2 to 3 weeks, just saw him last week so follow-up could be with her if Ardis Hughs not available, or myself.

## 2020-03-05 NOTE — Telephone Encounter (Signed)
appt made for the pt to follow up with Alonza Bogus on 03/27/20 at 830 am.  Lab order in for Thursday with Dr Ardis Hughs

## 2020-03-05 NOTE — Progress Notes (Signed)
Sepsis advisory alerted RN. MD made aware. No new orders.

## 2020-03-05 NOTE — Telephone Encounter (Signed)
-----   Message from Keith Ferguson, Keith [name] sent at 03/05/2020  1:25 PM EDT ----- Regarding: labs and follow up This patient has been admitted over the past several days with GI bleeding in setting of chronic anticoagulation.  He had EGD with friable gastritis, colonoscopy with finding of a lot of dark red blood in the colon but no active bleeding and somewhat poor prep.  Capsule endoscopy was done over the weekend with no source identified, fair prep.  Hemoglobin has been stable over the past 3 days at 8.7  Be discharged today, back on Xarelto  [name] please put in order for labs to be done on Thursday morning results to Dr. Ardis Hughs. You probably need weekly hemoglobins over the next several weeks to assure stability.  You will also need office follow-up with Dr. Ardis Hughs in 2 to 3 weeks, just saw him last week so follow-up could be with her if Ardis Hughs not available, or myself.

## 2020-03-05 NOTE — Progress Notes (Signed)
Instructions were reviewed with patient. All questions were answered. Patient was transported to main entrance by wheelchair. ° °

## 2020-03-05 NOTE — Telephone Encounter (Signed)
I have placed the order for the pt to come in every Thursday prior to the appt with Janett Billow on 4/6. Will call pt tomorrow morning.

## 2020-03-05 NOTE — Care Management Important Message (Signed)
Important Message  Patient Details IM Letter given to Dessa Phi RN Case Manager to present to the Patient                                          Name: NICKOLIS DIEL MRN: [id number] Date of Birth: 11-Feb-1966   Medicare Important Message Given:  Yes     Kerin Salen 03/05/2020, 12:26 PM

## 2020-03-05 NOTE — Progress Notes (Signed)
Patient ID: Keith [name], male   DOB: November 11, 1966, 54 y.o.   MRN: [id number]     Progress Note   Subjective   Day # 5 CC; GI bleed  Hemoglobin 8.7 stable  No further bowel movements Xarelto resumed yesterday   Objective   Vital signs in last 24 hours: Temp:  [97.7 F (36.5 C)-97.9 F (36.6 C)] 97.8 F (36.6 C) (03/15 1240) Pulse Rate:  [67-78] 74 (03/15 1240) Resp:  [14-25] 25 (03/15 1240) BP: (119-137)/(83-91) 137/91 (03/15 1240) SpO2:  [91 %-100 %] 96 % (03/15 1240) Last BM Date: 03/03/20 General:   Older white male in NAD Heart:  Regular rate and rhythm; no murmurs Lungs: Respirations even and unlabored, lungs CTA bilaterally Abdomen:  Soft, nontender and nondistended. Normal bowel sounds. Extremities:  Without edema. Neurologic:  Alert and oriented,  grossly normal neurologically. Psych:  Cooperative. Normal mood and affect.  Intake/Output from previous day: 03/14 0701 - 03/15 0700 In: 240 [P.O.:240] Out: -  Intake/Output this shift: No intake/output data recorded.  Lab Results: Recent Labs    03/04/20 0515 03/04/20 1108 03/05/20 0456  WBC  --   --  3.9*  HGB 8.8* 8.6* 8.7*  HCT 29.3* 28.7* 29.2*  PLT  --   --  109*   BMET No results for input(s): NA, K, CL, CO2, GLUCOSE, BUN, CREATININE, CALCIUM in the last 72 hours. LFT No results for input(s): PROT, ALBUMIN, AST, ALT, ALKPHOS, BILITOT, BILIDIR, IBILI in the last 72 hours. PT/INR No results for input(s): LABPROT, INR in the last 72 hours.  Studies/Results: DG Abd 1 View  Result Date: 03/05/2020 CLINICAL DATA:  Small bowel obstruction EXAM: ABDOMEN - 1 VIEW COMPARISON:  CT from 3 days ago FINDINGS: Nonobstructive bowel gas pattern. Gas is seen throughout the colon. Left flank pump and postoperative right femur. No concerning mass effect or gas collection. IMPRESSION: Nonobstructive bowel gas pattern. Electronically Signed   By: Monte Fantasia M.D.   On: 03/05/2020 04:17       Assessment /  Plan:    #1 54 year old white male with metastatic renal cell carcinoma, on oral chemotherapy and chronic anticoagulation with Xarelto who was admitted with severe anemia and GI bleeding.  Work-up with EGD, colonoscopy and capsule endoscopy unrevealing as to source of bleeding. EGD showed friable gastritis, colonoscopy with dark blood throughout the colon without obvious source and capsule endoscopy with poor visualization, no overt bleeding and felt to be incomplete.  Follow-up KUB no capsule identified, patient does not recall passing the capsule.  Patient has been stable over the past 48 hours without active bleeding and with stable hemoglobin. Xarelto was resumed yesterday  Plan; patient can be discharged home today He is scheduled to have follow-up CBC at our office on Thursday, 03/08/2020, and will arrange office follow-up with Dr. Ardis Hughs in 2 weeks.  Patient will need at least weekly CBCs over the next several weeks. Continue Protonix 40 mg p.o. twice daily Patient was advised that should he have any obvious recurrent bleeding to return to the emergency room.  If he does manifest recurrent bleeding will need enteroscopy, and if unrevealing probably repeat capsule endoscopy.   Active Problems:   Symptomatic anemia   Hematochezia   Acute GI bleeding   Acute blood loss anemia     LOS: 5 days   Nicolae Vasek EsterwoodPA-C  03/05/2020, 1:29 PM

## 2020-03-05 NOTE — Discharge Summary (Signed)
Physician Discharge Summary  Keith [name] IRJ:[id number] DOB: [birth year] DOA: 02/28/2020  PCP: [name], MD  Admit date: 02/28/2020 Discharge date: 03/05/2020  Admitted From: Home Disposition: Home Recommendations for Outpatient Follow-up:  1. Follow up with PCP in 1-2 weeks 2. Please obtain BMP/CBC in one week 3. Please follow up with Dr. 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 Last GI bleed was in September 2020, patient had an EGD that revealed grade C esophagitis and nonbleeding erosive gastropathy. Patient experienced near syncopal episode while visiting his primary care provider earlier today. Is not clear whether patient actually passed out. Patient was advised to come to the hospital for further assessment and management. On presentation to the hospital, patient's hemoglobin was found to be 5.3 g/dL, down from 8.3 g/dL. Platelet count is 113, INR is 2.6 with positive fecal occult blood. No headache, no neck pain, no chest pain, no GI symptoms and no urinary symptoms. Hospitalist team has been asked to admit patient for further assessment and management.  ED Course:On presentation to the hospital, this revealed temperature of 97.4, blood pressure of 80-140/50-79 mmHg, heart rate of 72 bpm, respiratory rate of 14 and O2 sat of 99%. Work-up done so far is as documented above. Patient will be transfused with 2 units of packed red blood cells. ER provider has consulted the GI consultant on-call, Dr. Hilarie Fredrickson.  Discharge Diagnoses:  Active Problems:   Symptomatic anemia    Hematochezia   Acute GI bleeding   Acute blood loss anemia  #1 GI bleed-egd-reflux esophagitis, gastritis with hemorrhage biopsied..  Results pending. Colonoscopy blood in the terminal ileum entire examined colon and nonbleeding internal hemorrhoids. Tagged RBC scan with no evidence of active bleeding.  CT angiogram of the abdomen no evidence of acute arterial bleeding. Capsule endoscopy small amount of heme noted in the stomach could be sites of biopsy during EGD or friable mucosa with gastritis Xarelto was restarted 03/04/2020 overnight patient was stable has had no further bloody bowel movements.  He did move his bowels but there was no blood.  No vomiting. Patient will follow up with Dr. Ardis Hughs office for follow-up blood work CBC this week on Thursday3/ 18 2021.  #2 acute on chronic anemia hemoglobinstable after transfusion. No further bleeding overnight.   #3 history of pulmonary embolism on Xarelto   #4 history of renal cell carcinoma metastatic status post nephrectomy radiotherapy followed by Dr. [name]  #5 chronic pain has a pain pump in place left lower quadrant  #6 chronic diarrhea question secondary to recent antibiotics versus chemotherapy  #7 elevated INR unclear reason on Xarelto which is on hold but that should not affect his INR. Liver functions otherwise okay.  #8 hypomagnesemia repletedmagnesium 1.9.   Estimated body mass index is 20.8 kg/m as calculated from the following:   Height as of this encounter: 6\' 3"  (1.905 m).   Weight as of this encounter: 75.5 kg.  Discharge Instructions  Discharge Instructions    Call MD for:  difficulty breathing, headache or visual disturbances   Complete by: As directed    Call MD for:  persistant nausea and vomiting   Complete by: As directed  Call MD for:  temperature >100.4   Complete by: As directed    Diet - low sodium heart healthy   Complete by: As directed    Increase activity slowly   Complete by: As  directed      Allergies as of 03/05/2020      Reactions   Ceftriaxone Hives   Hydrocodone Swelling      Medication List    STOP taking these medications   fluconazole 100 MG tablet Commonly known as: Diflucan   megestrol 40 MG/ML suspension Commonly known as: MEGACE     TAKE these medications   ALPRAZolam 1 MG tablet Commonly known as: XANAX TAKE 1 TABLET BY MOUTH THREE TIMES A DAY AS NEEDED FOR ANXIETY   Cabometyx 60 MG tablet Generic drug: cabozantinib Take 1 tablet (60 mg total) by mouth daily. With at least 8 ounces of water on an empty stomach. Do not eat for 2 hours before or 1 hour after. Swallow whole.   calcium carbonate 500 MG chewable tablet Commonly known as: TUMS - dosed in mg elemental calcium Chew 1 tablet by mouth 3 (three) times daily as needed for indigestion or heartburn.   DULoxetine 60 MG capsule Commonly known as: CYMBALTA TAKE 1 CAPSULE (60 MG TOTAL) BY MOUTH 2 (TWO) TIMES DAILY.   ferrous sulfate 325 (65 FE) MG tablet Take 1 tablet (325 mg total) by mouth daily with breakfast.   gabapentin 300 MG capsule Commonly known as: NEURONTIN TAKE 1 CAPSULE BY MOUTH THREE TIMES A DAY What changed: See the new instructions.   HYDROmorphone 8 MG tablet Commonly known as: DILAUDID Take 1 tablet (8 mg total) by mouth every 4 (four) hours as needed for severe pain.   hydroxypropyl methylcellulose / hypromellose 2.5 % ophthalmic solution Commonly known as: ISOPTO TEARS / GONIOVISC Place 1 drop into both eyes 3 (three) times daily as needed for dry eyes.   loratadine 10 MG tablet Commonly known as: CLARITIN Take 10 mg by mouth daily as needed for allergies.   PAIN MANAGEMENT INTRATHECAL (IT) PUMP 1 each by Intrathecal route continuous. Intrathecal (IT) medication:   Morphine.   pantoprazole 40 MG tablet Commonly known as: Protonix Take 1 tablet (40 mg total) by mouth 2 (two) times daily.   rivaroxaban 20 MG Tabs tablet Commonly known as:  Xarelto Take 1 tablet (20 mg total) by mouth daily with supper.   sucralfate 1 GM/10ML suspension Commonly known as: CARAFATE Take 10 mLs (1 g total) by mouth every 6 (six) hours.   tiZANidine 4 MG tablet Commonly known as: ZANAFLEX Take 1 tablet (4 mg total) by mouth every 8 (eight) hours as needed for muscle spasms.      Follow-up Information    [name], MD Follow up.   Specialty: Internal Medicine Contact information: [address] [place] [address] [phone]        [name], MD Follow up.   Specialty: Gastroenterology Contact information: [address] [place] [address] [phone]          Allergies  Allergen Reactions  . Ceftriaxone Hives  . Hydrocodone Swelling    Consultations: labauer gi  Procedures/Studies: DG Abd 1 View  Result Date: 03/05/2020 CLINICAL DATA:  Small bowel obstruction EXAM: ABDOMEN - 1 VIEW COMPARISON:  CT from 3 days ago FINDINGS: Nonobstructive bowel gas pattern. Gas is seen throughout the colon. Left flank pump and postoperative right femur. No concerning mass effect or gas collection. IMPRESSION:  Nonobstructive bowel gas pattern. Electronically Signed   By: Monte Fantasia M.D.   On: 03/05/2020 04:17   NM GI Blood Loss  Result Date: 03/01/2020 CLINICAL DATA:  GI bleeding EXAM: NUCLEAR MEDICINE GASTROINTESTINAL BLEEDING SCAN TECHNIQUE: Sequential abdominal images were obtained following intravenous administration of Tc-9m labeled red blood cells. RADIOPHARMACEUTICALS:  21.9 mCi Tc-74m pertechnetate in-vitro labeled red cells. COMPARISON:  None. FINDINGS: Adequate uptake of radiotracer is noted within the spleen and liver as well as the blood pool. Normal excretion is noted bilaterally. No focal area of increased uptake to suggest active hemorrhage is noted. IMPRESSION: No evidence of active hemorrhage. Electronically Signed   By: Inez Catalina M.D.   On: 03/01/2020 18:57   CT Angio Abd/Pel  w/ and/or w/o  Result Date: 03/02/2020 CLINICAL DATA:  54 year old male with a history of possible GI bleeding EXAM: CTA ABDOMEN AND PELVIS WITHOUT AND WITH CONTRAST TECHNIQUE: Multidetector CT imaging of the abdomen and pelvis was performed using the standard protocol during bolus administration of intravenous contrast. Multiplanar reconstructed images and MIPs were obtained and reviewed to evaluate the vascular anatomy. CONTRAST:  192mL OMNIPAQUE IOHEXOL 350 MG/ML SOLN COMPARISON:  Nuclear medicine bleeding study 03/01/2020, CT 12/20/2019 FINDINGS: VASCULAR Aorta: Minimal atherosclerosis of the abdominal aorta. Normal course caliber and contour with no dissection, or aneurysm. There is shallow irregular plaque/ulcerated plaque with no inflammatory changes just above the IMA origin. No diameter change of the aorta. Celiac: Patent, with no significant atherosclerotic changes. SMA: Patent, with no significant atherosclerotic changes. Renals: Single right renal artery.  No significant atherosclerotic changes. Surgical changes of left renal artery ligation. IMA: IMA patent. Right lower extremity: Unremarkable course, caliber, and contour of the right iliac system. Mild atherosclerosis. No aneurysm, dissection, or occlusion. Hypogastric artery is patent. Common femoral artery patent. Proximal SFA and profunda femoris patent. Left lower extremity: Unremarkable course, caliber, and contour of the left iliac system. Mild atherosclerosis. No aneurysm, dissection, or occlusion. Hypogastric artery is patent. Common femoral artery patent. Proximal SFA and profunda femoris patent. Veins: Unremarkable appearance of the venous system. Review of the MIP images confirms the above findings. NON-VASCULAR Lower chest: No acute. Hepatobiliary: Unremarkable appearance of the liver. Unremarkable gall bladder. Pancreas: Redemonstration of hypervascular masses involving the pancreatic head and the head/neck junction. The largest at the  junction is essentially unchanged measuring 2.5 cm. This does have circumferential involvement of the local gastroduodenal artery and gastroepiploic artery, as well as likely the superior pancreaticoduodenal arcade. Similar degree duct dilation of the body and tail with developing atrophy distally. No local inflammatory changes. Spleen: Unremarkable. Adrenals/Urinary Tract: Unremarkable appearance of adrenal glands. Right: No hydronephrosis. Symmetric perfusion to the left. No nephrolithiasis. Unremarkable course of the right ureter. Left: Left nephrectomy Unilateral ir partially distended. Stomach/Bowel: Unremarkable appearance of the stomach. Unremarkable appearance of small bowel. No evidence of obstruction. Normal appendix. Fluid-filled proximal colon. No focal inflammatory changes or wall thickening. No extravasation of contrast. No pooling or puddling of contrast identified. Lymphatic: No adenopathy. Mesenteric: No intraperitoneal free fluid or hemorrhage. No mesenteric adenopathy. Reproductive: Unremarkable appearance of the pelvic organs. Other: Generator on the left anterior abdominal wall with lead terminating within the spinal canal out of the field of view. Musculoskeletal: No acute displaced fracture.  Degenerative changes. Lucent lesion involving the right aspect of the sacrum is relatively unchanged over time dating to the CT 07/07/2018. Lucent lesion with sclerotic border in the inferior pubic ramus on the right, unchanged. Surgical changes of proximal right  femur. IMPRESSION: No acute arterial abnormality, with no evidence of gastrointestinal hemorrhage. Aortic Atherosclerosis (ICD10-I70.0). Unchanged appearance of hypervascular pancreatic lesions. Additional ancillary findings as above. Electronically Signed   By: Corrie Mckusick D.O.   On: 03/02/2020 14:47    (Echo, Carotid, EGD, Colonoscopy, ERCP)    Subjective:  He is resting in bed he is awake alert anxious to go home had few bowel  movements but no blood seen no nausea vomiting Discharge Exam: Vitals:   03/05/20 0452 03/05/20 1240  BP: 119/83 (!) 137/91  Pulse: 78 74  Resp: 16 (!) 25  Temp: 97.7 F (36.5 C) 97.8 F (36.6 C)  SpO2: 96% 96%   Vitals:   03/04/20 1335 03/04/20 2043 03/05/20 0452 03/05/20 1240  BP: 129/87 126/86 119/83 (!) 137/91  Pulse: 67 77 78 74  Resp: 14 16 16  (!) 25  Temp: 97.9 F (36.6 C) 97.8 F (36.6 C) 97.7 F (36.5 C) 97.8 F (36.6 C)  TempSrc: Oral Oral Oral Oral  SpO2: 100% 91% 96% 96%  Weight:      Height:        General: Pt is alert, awake, not in acute distress Cardiovascular: RRR, S1/S2 +, no rubs, no gallops Respiratory: CTA bilaterally, no wheezing, no rhonchi Abdominal: Soft, NT, ND, bowel sounds + pain pump left lower quadrant in place Extremities: no edema, no cyanosis    The results of significant diagnostics from this hospitalization (including imaging, microbiology, ancillary and laboratory) are listed below for reference.     Microbiology: Recent Results (from the past 240 hour(s))  Respiratory Panel by RT PCR (Flu A&B, Covid) - Nasopharyngeal Swab     Status: None   Collection Time: 02/28/20  7:51 PM   Specimen: Nasopharyngeal Swab  Result Value Ref Range Status   SARS Coronavirus 2 by RT PCR NEGATIVE NEGATIVE Final    Comment: (NOTE) SARS-CoV-2 target nucleic acids are NOT DETECTED. The SARS-CoV-2 RNA is generally detectable in upper respiratoy specimens during the acute phase of infection. The lowest concentration of SARS-CoV-2 viral copies this assay can detect is 131 copies/mL. A negative result does not preclude SARS-Cov-2 infection and should not be used as the sole basis for treatment or other patient management decisions. A negative result may occur with  improper specimen collection/handling, submission of specimen other than nasopharyngeal swab, presence of viral mutation(s) within the areas targeted by this assay, and inadequate number of  viral copies (<131 copies/mL). A negative result must be combined with clinical observations, patient history, and epidemiological information. The expected result is Negative. Fact Sheet for Patients:  PinkCheek.be Fact Sheet for Healthcare Providers:  GravelBags.it This test is not yet ap proved or cleared by the [place] FDA and  has been authorized for detection and/or diagnosis of SARS-CoV-2 by FDA under an Emergency Use Authorization (EUA). This EUA will remain  in effect (meaning this test can be used) for the duration of the COVID-19 declaration under Section 564(b)(1) of the Act, 21 U.S.C. section 360bbb-3(b)(1), unless the authorization is terminated or revoked sooner.    Influenza A by PCR NEGATIVE NEGATIVE Final   Influenza B by PCR NEGATIVE NEGATIVE Final    Comment: (NOTE) The Xpert Xpress SARS-CoV-2/FLU/RSV assay is intended as an aid in  the diagnosis of influenza from Nasopharyngeal swab specimens and  should not be used as a sole basis for treatment. Nasal washings and  aspirates are unacceptable for Xpert Xpress SARS-CoV-2/FLU/RSV  testing. Fact Sheet for Patients: PinkCheek.be Fact Sheet  for Healthcare Providers: GravelBags.it This test is not yet approved or cleared by the [place] and  has been authorized for detection and/or diagnosis of SARS-CoV-2 by  FDA under an Emergency Use Authorization (EUA). This EUA will remain  in effect (meaning this test can be used) for the duration of the  Covid-19 declaration under Section 564(b)(1) of the Act, 21  U.S.C. section 360bbb-3(b)(1), unless the authorization is  terminated or revoked. Performed at [facility], [place] [address], [place], [place] [address]      Labs: BNP (last 3 results) No results for input(s): BNP in the last 8760 hours. Basic Metabolic  Panel: Recent Labs  Lab 02/28/20 1636 02/29/20 0734 03/01/20 0044 03/02/20 1034 03/03/20 0538  NA 141 138  --  137  --   K 4.7 4.8  --  4.7  --   CL 109 110  --  106  --   CO2 25 23  --  25  --   GLUCOSE 101* 92  --  80  --   BUN 28* 29*  --  19  --   CREATININE 1.52* 1.36*  --  1.22  --   CALCIUM 7.4* 7.1*  --  7.9*  --   MG  --  1.4* 1.9  --  1.9  PHOS  --  2.9  --   --   --    Liver Function Tests: Recent Labs  Lab 02/28/20 1636  AST 17  ALT 22  ALKPHOS 69  BILITOT 0.4  PROT 5.3*  ALBUMIN 2.4*   No results for input(s): LIPASE, AMYLASE in the last 168 hours. No results for input(s): AMMONIA in the last 168 hours. CBC: Recent Labs  Lab 02/28/20 1636 02/28/20 1636 02/29/20 0734 02/29/20 1017 03/03/20 1642 03/03/20 2230 03/04/20 0515 03/04/20 1108 03/05/20 0456  WBC 5.4  --  5.6  --   --   --   --   --  3.9*  NEUTROABS 4.1  --   --   --   --   --   --   --   --   HGB 5.3*   < > 6.8*   < > 8.7* 8.9* 8.8* 8.6* 8.7*  HCT 18.5*   < > 22.6*   < > 29.0* 29.1* 29.3* 28.7* 29.2*  MCV 109.5*  --  100.4*  --   --   --   --   --  102.1*  PLT 113*  --  98*  --   --   --   --   --  109*   < > = values in this interval not displayed.   Cardiac Enzymes: No results for input(s): CKTOTAL, CKMB, CKMBINDEX, TROPONINI in the last 168 hours. BNP: Invalid input(s): POCBNP CBG: Recent Labs  Lab 03/04/20 0732 03/04/20 1107 03/04/20 1627 03/05/20 0752 03/05/20 1202  GLUCAP 97 112* 78 107* 85   D-Dimer No results for input(s): DDIMER in the last 72 hours. Hgb A1c No results for input(s): HGBA1C in the last 72 hours. Lipid Profile No results for input(s): CHOL, HDL, LDLCALC, TRIG, CHOLHDL, LDLDIRECT in the last 72 hours. Thyroid function studies No results for input(s): TSH, T4TOTAL, T3FREE, THYROIDAB in the last 72 hours.  Invalid input(s): FREET3 Anemia work up No results for input(s): VITAMINB12, FOLATE, FERRITIN, TIBC, IRON, RETICCTPCT in the last 72  hours. Urinalysis    Component Value Date/Time   COLORURINE YELLOW 11/12/2015 2228   APPEARANCEUR CLOUDY (A)  11/12/2015 2228   LABSPEC 1.014 11/12/2015 2228   PHURINE 6.0 11/12/2015 2228   GLUCOSEU NEGATIVE 11/12/2015 2228   HGBUR NEGATIVE 11/12/2015 2228   BILIRUBINUR NEGATIVE 11/12/2015 2228   KETONESUR NEGATIVE 11/12/2015 2228   PROTEINUR NEGATIVE 11/12/2015 2228   UROBILINOGEN 0.2 09/20/2015 1547   NITRITE NEGATIVE 11/12/2015 2228   LEUKOCYTESUR NEGATIVE 11/12/2015 2228   Sepsis Labs Invalid input(s): PROCALCITONIN,  WBC,  LACTICIDVEN Microbiology Recent Results (from the past 240 hour(s))  Respiratory Panel by RT PCR (Flu A&B, Covid) - Nasopharyngeal Swab     Status: None   Collection Time: 02/28/20  7:51 PM   Specimen: Nasopharyngeal Swab  Result Value Ref Range Status   SARS Coronavirus 2 by RT PCR NEGATIVE NEGATIVE Final    Comment: (NOTE) SARS-CoV-2 target nucleic acids are NOT DETECTED. The SARS-CoV-2 RNA is generally detectable in upper respiratoy specimens during the acute phase of infection. The lowest concentration of SARS-CoV-2 viral copies this assay can detect is 131 copies/mL. A negative result does not preclude SARS-Cov-2 infection and should not be used as the sole basis for treatment or other patient management decisions. A negative result may occur with  improper specimen collection/handling, submission of specimen other than nasopharyngeal swab, presence of viral mutation(s) within the areas targeted by this assay, and inadequate number of viral copies (<131 copies/mL). A negative result must be combined with clinical observations, patient history, and epidemiological information. The expected result is Negative. Fact Sheet for Patients:  PinkCheek.be Fact Sheet for Healthcare Providers:  GravelBags.it This test is not yet ap proved or cleared by the [place] FDA and  has been authorized  for detection and/or diagnosis of SARS-CoV-2 by FDA under an Emergency Use Authorization (EUA). This EUA will remain  in effect (meaning this test can be used) for the duration of the COVID-19 declaration under Section 564(b)(1) of the Act, 21 U.S.C. section 360bbb-3(b)(1), unless the authorization is terminated or revoked sooner.    Influenza A by PCR NEGATIVE NEGATIVE Final   Influenza B by PCR NEGATIVE NEGATIVE Final    Comment: (NOTE) The Xpert Xpress SARS-CoV-2/FLU/RSV assay is intended as an aid in  the diagnosis of influenza from Nasopharyngeal swab specimens and  should not be used as a sole basis for treatment. Nasal washings and  aspirates are unacceptable for Xpert Xpress SARS-CoV-2/FLU/RSV  testing. Fact Sheet for Patients: PinkCheek.be Fact Sheet for Healthcare Providers: GravelBags.it This test is not yet approved or cleared by the [place] FDA and  has been authorized for detection and/or diagnosis of SARS-CoV-2 by  FDA under an Emergency Use Authorization (EUA). This EUA will remain  in effect (meaning this test can be used) for the duration of the  Covid-19 declaration under Section 564(b)(1) of the Act, 21  U.S.C. section 360bbb-3(b)(1), unless the authorization is  terminated or revoked. Performed at [facility] [place] [address], [place], [place] [address]      Time coordinating discharge:  39 minutes  SIGNED:   Georgette Shell, MD  [facility] 03/05/2020, 2:03 PM Pager   If 7PM-7AM, please contact night-coverage [email or web address] Password [email or web address]

## 2020-03-06 NOTE — Telephone Encounter (Signed)
The pt has been advised of the appt with Janett Billow. He has also been advised to come to our office every Thursday prior to the appt with Janett Billow for repeat labs starting this Thursday.  The pt has been advised of the information and verbalized understanding.

## 2020-03-08 ENCOUNTER — Other Ambulatory Visit: Payer: Self-pay

## 2020-03-08 ENCOUNTER — Telehealth: Payer: Self-pay

## 2020-03-08 ENCOUNTER — Ambulatory Visit (HOSPITAL_COMMUNITY)
Admission: RE | Admit: 2020-03-08 | Discharge: 2020-03-08 | Disposition: A | Discharge status: Home / Self Care | Payer: Medicare Other | Source: Ambulatory Visit | Attending: Oncology | Admitting: Oncology

## 2020-03-08 ENCOUNTER — Other Ambulatory Visit (INDEPENDENT_AMBULATORY_CARE_PROVIDER_SITE_OTHER): Payer: Medicare Other

## 2020-03-08 ENCOUNTER — Encounter: Payer: Self-pay | Admitting: Radiation Oncology

## 2020-03-08 DIAGNOSIS — K922 Gastrointestinal hemorrhage, unspecified: Secondary | ICD-10-CM | POA: Diagnosis not present

## 2020-03-08 DIAGNOSIS — C7951 Secondary malignant neoplasm of bone: Secondary | ICD-10-CM | POA: Insufficient documentation

## 2020-03-08 DIAGNOSIS — C649 Malignant neoplasm of unspecified kidney, except renal pelvis: Secondary | ICD-10-CM | POA: Diagnosis not present

## 2020-03-08 DIAGNOSIS — M79662 Pain in left lower leg: Secondary | ICD-10-CM | POA: Diagnosis not present

## 2020-03-08 LAB — CBC WITH DIFFERENTIAL/PLATELET
Basophils Absolute: 0 10*3/uL (ref 0.0–0.1)
Basophils Relative: 0.9 % (ref 0.0–3.0)
Eosinophils Absolute: 0.2 10*3/uL (ref 0.0–0.7)
Eosinophils Relative: 3.8 % (ref 0.0–5.0)
HCT: 32.5 % — ABNORMAL LOW (ref 39.0–52.0)
Hemoglobin: 10.4 g/dL — ABNORMAL LOW (ref 13.0–17.0)
Lymphocytes Relative: 17 % (ref 12.0–46.0)
Lymphs Abs: 1 10*3/uL (ref 0.7–4.0)
MCHC: 32 g/dL (ref 30.0–36.0)
MCV: 98.6 fl (ref 78.0–100.0)
Monocytes Absolute: 0.4 10*3/uL (ref 0.1–1.0)
Monocytes Relative: 7.3 % (ref 3.0–12.0)
Neutro Abs: 4.1 10*3/uL (ref 1.4–7.7)
Neutrophils Relative %: 71 % (ref 43.0–77.0)
Platelets: 141 10*3/uL — ABNORMAL LOW (ref 150.0–400.0)
RBC: 3.3 Mil/uL — ABNORMAL LOW (ref 4.22–5.81)
RDW: 23.8 % — ABNORMAL HIGH (ref 11.5–15.5)
WBC: 5.7 10*3/uL (ref 4.0–10.5)

## 2020-03-08 MED ORDER — GADOBUTROL 1 MMOL/ML IV SOLN
7.0000 mL | Freq: Once | INTRAVENOUS | Status: AC | PRN
  Administered 2020-03-08: 7 mL via INTRAVENOUS

## 2020-03-08 NOTE — Progress Notes (Signed)
  Radiation Oncology         [phone] ________________________________  Name: Keith [name] MRN: [id number]  Date: 03/08/2020  DOB: 1966/04/29  Chart Note:  I was contacted by Dr. [name] to review this patient.  He has increasing left lower leg pain and had MRI.  This patient had previous left tibial radiotherapy and now has increasing pain.  He got 8 Gy in one fraction in Nov 2016 to this field and an opposing beam:    Current MRI shows the treated lesion (A) is smaller, and there is a new lesion (B) more proximal in the left fibula:    Radiographic Findings:   MR TIBIA FIBULA LEFT W WO CONTRAST  Result Date: 03/08/2020 CLINICAL DATA:  History of metastatic renal cell carcinoma. Bilateral lower leg pain EXAM: MRI OF LOWER LEFT EXTREMITY WITHOUT AND WITH CONTRAST TECHNIQUE: Multiplanar, multisequence MR imaging of the left tibia and fibula was performed both before and after administration of intravenous contrast. CONTRAST:  50mL GADAVIST GADOBUTROL 1 MMOL/ML IV SOLN COMPARISON:  MRI 09/12/2015 FINDINGS: Expansile left fibular diaphyseal mass measuring 2.7 cm AP x 3.6 cm TR x 5.4 cm CC (series 4, image 11; series 9, image 27). Mass previously measured 3.4 x 3.6 x 5.7 cm on 09/12/2015. Mass is heterogeneously T2 hyperintense with avid postcontrast enhancement. There is expansion and bony destruction of the underlying fibula. Invasion of the adjacent lower leg musculature including within the peroneus longus, extensor hallucis longus, and tibialis posterior muscles with associated intramuscular edema. There is a new rounded T2 hyperintense marrow replacing lesion within the proximal fibular metadiaphysis measuring 1.4 x 1.0 cm (series 7, image 20; series 4, image 11). Lesion is minimally expansile without cortical breakthrough or extraosseous soft tissue component. Proximal right tibial metaphyseal mass with the main component of the mass measuring 6.2 x 5.1 cm (series 4, image 20). This  component previously measured 4.8 x 4.6 cm. There is new extension of tumor within the medullary space into the proximal metadiaphysis of the right tibia extending 4.0 cm below the original lesion (series 4, image 21). Suspect a small component of extraosseous tumor extension at the anteromedial aspect of the proximal metaphysis (series 3, image 23). No additional lesions within the remaining visualized right tibia and right fibula. IMPRESSION: 1. Avidly enhancing 5.4 cm metastatic left fibular diaphyseal mass, slightly decreased in size compared to prior MRI 09/12/2015. 2. New 1.4 x 1.0 cm marrow replacing lesion within the proximal right fibular metadiaphysis consistent with metastatic disease. 3. Enlarging right proximal tibial metaphyseal mass with new extension of tumor into the proximal metadiaphysis of the right tibia. These results will be called to the ordering clinician or representative by the Radiologist Assistant, and communication documented in the PACS or [employer]. Electronically Signed   By: Davina Poke D.O.   On: 03/08/2020 10:45   A/P:  He could get additional palliative radiation.  ________________________________  Sheral Apley Tammi Klippel, M.D.

## 2020-03-08 NOTE — Telephone Encounter (Signed)
Received phone call from Radiology department regarding patient's MRI results from scan 03/08/20. Dr. [name] made aware of report availability. No new orders at this time.

## 2020-03-13 ENCOUNTER — Encounter: Payer: Self-pay | Admitting: Urology

## 2020-03-14 ENCOUNTER — Ambulatory Visit
Admission: RE | Admit: 2020-03-14 | Discharge: 2020-03-14 | Disposition: A | Discharge status: Home / Self Care | Payer: Medicare Other | Source: Ambulatory Visit | Attending: Urology | Admitting: Urology

## 2020-03-14 ENCOUNTER — Encounter: Payer: Self-pay | Admitting: Urology

## 2020-03-14 ENCOUNTER — Ambulatory Visit: Payer: Medicare Other | Admitting: Radiation Oncology

## 2020-03-14 ENCOUNTER — Ambulatory Visit: Payer: Medicare Other

## 2020-03-14 NOTE — Progress Notes (Signed)
Patient called and cancelled his re-consult visit and CT SIM stating that he has decided that he does not want any further XRT.  Nicholos Johns, MMS, PA-C [place] [facility] [place]: [phone]  Fax: [phone]

## 2020-03-15 ENCOUNTER — Other Ambulatory Visit (INDEPENDENT_AMBULATORY_CARE_PROVIDER_SITE_OTHER): Payer: Medicare Other

## 2020-03-15 DIAGNOSIS — C642 Malignant neoplasm of left kidney, except renal pelvis: Secondary | ICD-10-CM | POA: Diagnosis not present

## 2020-03-15 DIAGNOSIS — K922 Gastrointestinal hemorrhage, unspecified: Secondary | ICD-10-CM

## 2020-03-15 DIAGNOSIS — D500 Iron deficiency anemia secondary to blood loss (chronic): Secondary | ICD-10-CM | POA: Diagnosis not present

## 2020-03-15 LAB — CBC WITH DIFFERENTIAL/PLATELET
Basophils Absolute: 0.1 10*3/uL (ref 0.0–0.1)
Basophils Relative: 1.2 % (ref 0.0–3.0)
Eosinophils Absolute: 0.2 10*3/uL (ref 0.0–0.7)
Eosinophils Relative: 4.6 % (ref 0.0–5.0)
HCT: 27.8 % — ABNORMAL LOW (ref 39.0–52.0)
Hemoglobin: 8.9 g/dL — ABNORMAL LOW (ref 13.0–17.0)
Lymphocytes Relative: 17.6 % (ref 12.0–46.0)
Lymphs Abs: 0.9 10*3/uL (ref 0.7–4.0)
MCHC: 31.9 g/dL (ref 30.0–36.0)
MCV: 101.5 fl — ABNORMAL HIGH (ref 78.0–100.0)
Monocytes Absolute: 0.4 10*3/uL (ref 0.1–1.0)
Monocytes Relative: 8.4 % (ref 3.0–12.0)
Neutro Abs: 3.5 10*3/uL (ref 1.4–7.7)
Neutrophils Relative %: 68.2 % (ref 43.0–77.0)
Platelets: 140 10*3/uL — ABNORMAL LOW (ref 150.0–400.0)
RBC: 2.74 Mil/uL — ABNORMAL LOW (ref 4.22–5.81)
RDW: 24.7 % — ABNORMAL HIGH (ref 11.5–15.5)
WBC: 5.2 10*3/uL (ref 4.0–10.5)

## 2020-03-16 ENCOUNTER — Other Ambulatory Visit: Payer: Self-pay

## 2020-03-16 ENCOUNTER — Other Ambulatory Visit: Payer: Self-pay | Admitting: Oncology

## 2020-03-16 DIAGNOSIS — D649 Anemia, unspecified: Secondary | ICD-10-CM

## 2020-03-19 ENCOUNTER — Other Ambulatory Visit: Payer: Self-pay

## 2020-03-19 DIAGNOSIS — D649 Anemia, unspecified: Secondary | ICD-10-CM

## 2020-03-22 ENCOUNTER — Other Ambulatory Visit (INDEPENDENT_AMBULATORY_CARE_PROVIDER_SITE_OTHER): Payer: Medicare Other

## 2020-03-22 DIAGNOSIS — D649 Anemia, unspecified: Secondary | ICD-10-CM | POA: Diagnosis not present

## 2020-03-22 LAB — CBC WITH DIFFERENTIAL/PLATELET
Basophils Absolute: 0 10*3/uL (ref 0.0–0.1)
Basophils Relative: 0.9 % (ref 0.0–3.0)
Eosinophils Absolute: 0.2 10*3/uL (ref 0.0–0.7)
Eosinophils Relative: 4 % (ref 0.0–5.0)
HCT: 32.2 % — ABNORMAL LOW (ref 39.0–52.0)
Hemoglobin: 10.2 g/dL — ABNORMAL LOW (ref 13.0–17.0)
Lymphocytes Relative: 19.9 % (ref 12.0–46.0)
Lymphs Abs: 0.9 10*3/uL (ref 0.7–4.0)
MCHC: 31.5 g/dL (ref 30.0–36.0)
MCV: 102.8 fl — ABNORMAL HIGH (ref 78.0–100.0)
Monocytes Absolute: 0.3 10*3/uL (ref 0.1–1.0)
Monocytes Relative: 6.8 % (ref 3.0–12.0)
Neutro Abs: 3.2 10*3/uL (ref 1.4–7.7)
Neutrophils Relative %: 68.4 % (ref 43.0–77.0)
Platelets: 167 10*3/uL (ref 150.0–400.0)
RBC: 3.13 Mil/uL — ABNORMAL LOW (ref 4.22–5.81)
RDW: 24.7 % — ABNORMAL HIGH (ref 11.5–15.5)
WBC: 4.6 10*3/uL (ref 4.0–10.5)

## 2020-03-22 LAB — FERRITIN: Ferritin: 104.6 ng/mL (ref 22.0–322.0)

## 2020-03-22 LAB — FOLATE: Folate: 4.9 ng/mL — ABNORMAL LOW (ref >5.9)

## 2020-03-22 LAB — VITAMIN B12: Vitamin B-12: 96 pg/mL — ABNORMAL LOW (ref 211–911)

## 2020-03-27 ENCOUNTER — Ambulatory Visit: Payer: Medicare Other | Admitting: Gastroenterology

## 2020-03-27 ENCOUNTER — Encounter: Payer: Self-pay | Admitting: Gastroenterology

## 2020-03-27 ENCOUNTER — Telehealth: Payer: Self-pay

## 2020-03-27 VITALS — BP 140/78 | HR 64 | Temp 97.3°F | Ht 75.0 in | Wt 168.2 lb

## 2020-03-27 DIAGNOSIS — K922 Gastrointestinal hemorrhage, unspecified: Secondary | ICD-10-CM | POA: Diagnosis not present

## 2020-03-27 DIAGNOSIS — D62 Acute posthemorrhagic anemia: Secondary | ICD-10-CM | POA: Diagnosis not present

## 2020-03-27 MED ORDER — PANTOPRAZOLE SODIUM 40 MG PO TBEC: 40.0000 mg | DELAYED_RELEASE_TABLET | Freq: Two times a day (BID) | ORAL | 11 refills | Status: DC

## 2020-03-27 NOTE — Progress Notes (Signed)
03/27/2020 Keith [name] [id number] [birth year]   HISTORY OF PRESENT ILLNESS:  This is a 54 year old white male with metastatic renal cell carcinoma, on oral chemotherapy and chronic anticoagulation with Xarelto who was admitted 3/9-3/15 with severe anemia and GI bleeding.  Received 4 units PRBC's.  Work-up with EGD, colonoscopy and capsule endoscopy unrevealing as to source of bleeding. EGD showed friable gastritis, colonoscopy with dark blood throughout the colon without obvious source and capsule endoscopy with poor visualization, no overt bleeding and felt to be incomplete.  He has continued on pantoprazole 40 mg twice daily as well as iron supplements.  He tells me that he feels much better.  He has had no further signs of GI bleeding.  Repeat hemoglobin on April 1 was 10.2 g.   Past Medical History:  Diagnosis Date  . Diabetes mellitus without complication ([place])    diet controlled only  . GI bleed   . left renal ca d'd 2008  . 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 BIOPSY  08/28/2019   Procedure: BIOPSY;  Surgeon: Thornton Park, MD;  Location: [facility] ENDOSCOPY;  Service: Gastroenterology;;  . BIOPSY  03/01/2020   Procedure: BIOPSY;  Surgeon: Mauri Pole, MD;  Location: [facility];  Service: Endoscopy;;  . COLONOSCOPY W/ POLYPECTOMY    . COLONOSCOPY WITH PROPOFOL N/A 03/01/2020   Procedure: COLONOSCOPY WITH PROPOFOL;  Surgeon: Mauri Pole, MD;  Location: [facility];  Service: Endoscopy;  Laterality: N/A;  . ESOPHAGOGASTRODUODENOSCOPY (EGD) WITH PROPOFOL N/A 08/28/2019   Procedure: ESOPHAGOGASTRODUODENOSCOPY (EGD) WITH PROPOFOL;  Surgeon: Thornton Park, MD;  Location: [facility];  Service: Gastroenterology;  Laterality: N/A;  . ESOPHAGOGASTRODUODENOSCOPY (EGD) WITH PROPOFOL N/A 03/01/2020   Procedure: ESOPHAGOGASTRODUODENOSCOPY  (EGD) WITH PROPOFOL;  Surgeon: Mauri Pole, MD;  Location: [facility];  Service: Endoscopy;  Laterality: N/A;  . FEMUR IM NAIL Right 02/16/2018   Procedure: INTRAMEDULLARY (IM) NAIL RIGHT FEMORAL;  Surgeon: Paralee Cancel, MD;  Location: [facility] ORS;  Service: Orthopedics;  Laterality: Right;  . GIVENS CAPSULE STUDY N/A 03/03/2020   Procedure: GIVENS CAPSULE STUDY;  Surgeon: Mauri Pole, MD;  Location: [facility];  Service: Endoscopy;  Laterality: N/A;  . HAND SURGERY Right   . IR ANGIOGRAM EXTREMITY RIGHT  02/15/2018  . IR ANGIOGRAM SELECTIVE EACH ADDITIONAL VESSEL  02/15/2018  . IR ANGIOGRAM SELECTIVE EACH ADDITIONAL VESSEL  02/15/2018  . IR ANGIOGRAM SELECTIVE EACH ADDITIONAL VESSEL  02/15/2018  . IR ANGIOGRAM SELECTIVE EACH ADDITIONAL VESSEL  02/15/2018  . IR EMBO TUMOR ORGAN ISCHEMIA INFARCT INC GUIDE ROADMAPPING  02/15/2018  . IR EMBO TUMOR ORGAN ISCHEMIA INFARCT INC GUIDE ROADMAPPING  02/15/2018  . IR US GUIDE VASC ACCESS LEFT  02/15/2018  . LUNG REMOVAL, PARTIAL Right 09/2008  . NEPHRECTOMY RADICAL     Left   . PAIN PUMP IMPLANTATION N/A 05/16/2015   Procedure: Intrathecal pain pump placement;  Surgeon: Clydell Hakim, MD;  Location: [place] [facility];  Service: Neurosurgery;  Laterality: N/A;  Intrathecal pain pump placement    reports that he has been smoking cigarettes. He has a 30.00 pack-year smoking history. He has never used smokeless tobacco. He reports current drug use. Drug: Marijuana. He reports that he does not drink alcohol. family history includes Cancer in his maternal aunt; Diabetes in his brother; Hypertension in his mother; Irritable bowel syndrome in his  sister. Allergies  Allergen Reactions  . Ceftriaxone Hives  . Hydrocodone Swelling      Outpatient Encounter Medications as of 03/27/2020  Medication Sig  . ALPRAZolam (XANAX) 1 MG tablet TAKE 1 TABLET BY MOUTH THREE TIMES A DAY AS NEEDED FOR ANXIETY  . cabozantinib (CABOMETYX) 60 MG tablet Take 1 tablet (60 mg  total) by mouth daily. With at least 8 ounces of water on an empty stomach. Do not eat for 2 hours before or 1 hour after. Swallow whole.  . calcium carbonate (TUMS - DOSED IN MG ELEMENTAL CALCIUM) 500 MG chewable tablet Chew 1 tablet by mouth 3 (three) times daily as needed for indigestion or heartburn.   . DULoxetine (CYMBALTA) 60 MG capsule TAKE 1 CAPSULE (60 MG TOTAL) BY MOUTH 2 (TWO) TIMES DAILY.  [name] gabapentin (NEURONTIN) 300 MG capsule TAKE 1 CAPSULE BY MOUTH THREE TIMES A DAY (Patient taking differently: Take 300 mg by mouth 3 (three) times daily. )  . HYDROmorphone (DILAUDID) 8 MG tablet Take 1 tablet (8 mg total) by mouth every 4 (four) hours as needed for severe pain.  . hydroxypropyl methylcellulose / hypromellose (ISOPTO TEARS / GONIOVISC) 2.5 % ophthalmic solution Place 1 drop into both eyes 3 (three) times daily as needed for dry eyes.  [name] loratadine (CLARITIN) 10 MG tablet Take 10 mg by mouth daily as needed for allergies.   [name] PAIN MANAGEMENT INTRATHECAL, IT, PUMP 1 each by Intrathecal route continuous. Intrathecal (IT) medication:   Morphine.  . pantoprazole (PROTONIX) 40 MG tablet Take 1 tablet (40 mg total) by mouth 2 (two) times daily.  . rivaroxaban (XARELTO) 20 MG TABS tablet Take 1 tablet (20 mg total) by mouth daily with supper.  [name] tiZANidine (ZANAFLEX) 4 MG tablet Take 1 tablet (4 mg total) by mouth every 8 (eight) hours as needed for muscle spasms.  . ferrous sulfate 325 (65 FE) MG tablet Take 1 tablet (325 mg total) by mouth daily with breakfast.  . sucralfate (CARAFATE) 1 GM/10ML suspension Take 10 mLs (1 g total) by mouth every 6 (six) hours.   No facility-administered encounter medications on file as of 03/27/2020.    REVIEW OF SYSTEMS  : All other systems reviewed and negative except where noted in the History of Present Illness.   PHYSICAL EXAM: BP 140/78   Pulse 64   Temp (!) 97.3 F (36.3 C)   Ht 6' 3" (1.905 m)   Wt 168 lb 3.2 oz (76.3 kg)   BMI 21.02 kg/m    General: Well developed white male in no acute distress Head: Normocephalic and atraumatic Eyes:  Sclerae anicteric, conjunctiva pink. Ears: Normal auditory acuity Lungs: Clear throughout to auscultation; no increased WOB. Heart: Regular rate and rhythm; no M/R/G. Abdomen: Soft, non-distended.  BS present.  Non-tender.  Pain pump noted in LLQ. Musculoskeletal: Symmetrical with no gross deformities  Skin: No lesions on visible extremities Extremities: No edema  Neurological: Alert oriented x 4, grossly non-focal Psychological:  Alert and cooperative. Normal mood and affect  ASSESSMENT AND PLAN: *#79 54 year old white male with metastatic renal cell carcinoma on oral chemotherapy and chronic anticoagulation with Xarelto who was admitted with severe anemia and GI bleeding (melena).  Work-up with EGD, colonoscopy, and capsule endoscopy unrevealing as to source of bleeding. EGD showed friable gastritis, colonoscopy with dark blood throughout the colon without obvious source, and capsule endoscopy with poor visualization--no overt bleeding and felt to be incomplete.  Plan is to continue pantoprazole 40 mg twice  daily ongoingly.  New prescription will be sent.  Continue oral iron supplements for now.  Plan to monitor CBC weekly for a while and then if stable can extend time in between.  He was advised that if he starts having black stools again or any sign of bleeding he needs to proceed to the emergency department.  Plan would be for enteroscopy and then possible repeat wireless capsule endoscopy if he rebleeds.  Will have repeat CBC later this week either here or by his oncologist.   CC:  [name], MD

## 2020-03-27 NOTE — Telephone Encounter (Signed)
-----   Message from [name], MD sent at 03/27/2020 12:35 PM EDT -----   ----- Message ----- From: Loralie Champagne, PA-C Sent: 03/27/2020   9:07 AM EDT To: [name], MD

## 2020-03-27 NOTE — Patient Instructions (Signed)
If you are age 53 or older, your body mass index should be between 23-30. Your Body mass index is 21.02 kg/m. If this is out of the aforementioned range listed, please consider follow up with your Primary Care Provider.  If you are age 58 or younger, your body mass index should be between 19-25. Your Body mass index is 21.02 kg/m. If this is out of the aformentioned range listed, please consider follow up with your Primary Care Provider.   Your provider has requested that you go to the basement level for lab work before leaving today. Press "B" on the elevator. The lab is located at the first door on the left as you exit the elevator. Please return Friday to have a CBC drawn if you do not have one drawn prior to Friday.  Due to recent changes in healthcare laws, you may see the results of your imaging and laboratory studies on MyChart before your provider has had a chance to review them.  We understand that in some cases there may be results that are confusing or concerning to you. Not all laboratory results come back in the same time frame and the provider may be waiting for multiple results in order to interpret others.  Please give [place] 48 hours in order for your provider to thoroughly review all the results before contacting the office for clarification of your results.

## 2020-03-27 NOTE — Telephone Encounter (Signed)
The pt has been advised and lab order in [place] for 3 month f/u.  The pt states he will call PCP to set up B12 injections. I will forward to PCP.

## 2020-03-27 NOTE — Progress Notes (Signed)
I agree with the above note and plan.  Also noticed that he generally has macrocytosis and labs last week showed B12 and folate were both deficient.  Keith [name], he needs B12 shots 1000 mcg subcu or IM once daily for 1 week and then once weekly for 1 month.  He also needs to start taking a multivitamin once daily starting today such as Centrum adult which contains both B12 and folate. Please arrange for B12 and folate levels to be rechecked in 3 months.  Thanks

## 2020-03-27 NOTE — Telephone Encounter (Signed)
Author: [name], MD Service: Gastroenterology Author Type: Physician  Filed: 03/27/2020 12:37 PM Encounter Date: 03/27/2020 Status: Signed  Editor: [name], MD (Physician)     Show:Clear all [x] Manual[] Template[] Copied  Added by: [x] [name], MD  [] Hover for details I agree with the above note and plan.  Also noticed that he generally has macrocytosis and labs last week showed B12 and folate were both deficient.  Emmalynn Pinkham, he needs B12 shots 1000 mcg subcu or IM once daily for 1 week and then once weekly for 1 month.  He also needs to start taking a multivitamin once daily starting today such as Centrum adult which contains both B12 and folate. Please arrange for B12 and folate levels to be rechecked in 3 months.  Thanks

## 2020-03-29 ENCOUNTER — Ambulatory Visit: Payer: Medicare Other | Attending: Internal Medicine

## 2020-03-29 DIAGNOSIS — Z23 Encounter for immunization: Secondary | ICD-10-CM

## 2020-03-29 NOTE — Progress Notes (Signed)
   Covid-19 Vaccination Clinic  Name:  JAVION HOLMER    MRN: [id number] DOB: 27-Jul-1966  03/29/2020  Mr. Radilla was observed post Covid-19 immunization for 15 minutes without incident. He was provided with Vaccine Information Sheet and instruction to access the V-Safe system.   Mr. Bonaventure was instructed to call 911 with any severe reactions post vaccine: [name] Difficulty breathing  . Swelling of face and throat  . A fast heartbeat  . A bad rash all over body  . Dizziness and weakness   Immunizations Administered    Name Date Dose VIS Date Route   Moderna COVID-19 Vaccine 03/29/2020  8:11 AM 0.5 mL 11/22/2019 Intramuscular   Manufacturer: Levan Hurst   Lot: [id number]   [place]: 80777-273-99      Covid-19 Vaccination Clinic  Name:  HARRIS PENTON    MRN: [id number] DOB: 22-Feb-1966  03/29/2020  Mr. Bialas was observed post Covid-19 immunization for 15 minutes without incident. He was provided with Vaccine Information Sheet and instruction to access the V-Safe system.   Mr. Santilli was instructed to call 911 with any severe reactions post vaccine: [name] Difficulty breathing  . Swelling of face and throat  . A fast heartbeat  . A bad rash all over body  . Dizziness and weakness   Immunizations Administered    Name Date Dose VIS Date Route   Moderna COVID-19 Vaccine 03/29/2020  8:11 AM 0.5 mL 11/22/2019 Intramuscular   Manufacturer: Moderna   Lot: [id number]   [place]: [id number]

## 2020-04-01 ENCOUNTER — Other Ambulatory Visit: Payer: Self-pay | Admitting: Oncology

## 2020-04-01 DIAGNOSIS — C7800 Secondary malignant neoplasm of unspecified lung: Secondary | ICD-10-CM

## 2020-04-01 DIAGNOSIS — K8689 Other specified diseases of pancreas: Secondary | ICD-10-CM

## 2020-04-01 DIAGNOSIS — C649 Malignant neoplasm of unspecified kidney, except renal pelvis: Secondary | ICD-10-CM

## 2020-04-01 DIAGNOSIS — C7951 Secondary malignant neoplasm of bone: Secondary | ICD-10-CM

## 2020-04-18 ENCOUNTER — Other Ambulatory Visit: Payer: Self-pay | Admitting: Oncology

## 2020-04-20 DIAGNOSIS — G894 Chronic pain syndrome: Secondary | ICD-10-CM | POA: Diagnosis not present

## 2020-04-24 ENCOUNTER — Inpatient Hospital Stay: Payer: Medicare Other

## 2020-04-24 ENCOUNTER — Other Ambulatory Visit: Payer: Self-pay

## 2020-04-24 ENCOUNTER — Inpatient Hospital Stay: Payer: Medicare Other | Attending: Oncology | Admitting: Oncology

## 2020-04-24 VITALS — BP 152/89 | HR 70 | Temp 98.2°F | Resp 17 | Ht 75.0 in | Wt 168.9 lb

## 2020-04-24 DIAGNOSIS — Z9689 Presence of other specified functional implants: Secondary | ICD-10-CM | POA: Diagnosis not present

## 2020-04-24 DIAGNOSIS — D649 Anemia, unspecified: Secondary | ICD-10-CM

## 2020-04-24 DIAGNOSIS — F329 Major depressive disorder, single episode, unspecified: Secondary | ICD-10-CM | POA: Insufficient documentation

## 2020-04-24 DIAGNOSIS — C649 Malignant neoplasm of unspecified kidney, except renal pelvis: Secondary | ICD-10-CM | POA: Diagnosis not present

## 2020-04-24 DIAGNOSIS — Z7901 Long term (current) use of anticoagulants: Secondary | ICD-10-CM | POA: Diagnosis not present

## 2020-04-24 DIAGNOSIS — G620 Drug-induced polyneuropathy: Secondary | ICD-10-CM | POA: Diagnosis not present

## 2020-04-24 DIAGNOSIS — F419 Anxiety disorder, unspecified: Secondary | ICD-10-CM | POA: Insufficient documentation

## 2020-04-24 DIAGNOSIS — I2699 Other pulmonary embolism without acute cor pulmonale: Secondary | ICD-10-CM | POA: Insufficient documentation

## 2020-04-24 DIAGNOSIS — R197 Diarrhea, unspecified: Secondary | ICD-10-CM | POA: Diagnosis not present

## 2020-04-24 DIAGNOSIS — Z79899 Other long term (current) drug therapy: Secondary | ICD-10-CM | POA: Diagnosis not present

## 2020-04-24 DIAGNOSIS — C7800 Secondary malignant neoplasm of unspecified lung: Secondary | ICD-10-CM | POA: Insufficient documentation

## 2020-04-24 DIAGNOSIS — C642 Malignant neoplasm of left kidney, except renal pelvis: Secondary | ICD-10-CM | POA: Diagnosis not present

## 2020-04-24 DIAGNOSIS — M79605 Pain in left leg: Secondary | ICD-10-CM | POA: Diagnosis not present

## 2020-04-24 DIAGNOSIS — M79662 Pain in left lower leg: Secondary | ICD-10-CM | POA: Diagnosis not present

## 2020-04-24 LAB — CMP (CANCER CENTER ONLY)
ALT: 14 U/L (ref 0–44)
AST: 14 U/L — ABNORMAL LOW (ref 15–41)
Albumin: 2.6 g/dL — ABNORMAL LOW (ref 3.5–5.0)
Alkaline Phosphatase: 77 U/L (ref 38–126)
Anion gap: 5 (ref 5–15)
BUN: 19 mg/dL (ref 6–20)
CO2: 24 mmol/L (ref 22–32)
Calcium: 7.9 mg/dL — ABNORMAL LOW (ref 8.9–10.3)
Chloride: 114 mmol/L — ABNORMAL HIGH (ref 98–111)
Creatinine: 1.3 mg/dL — ABNORMAL HIGH (ref 0.61–1.24)
GFR, Est AFR Am: >60 mL/min (ref >60)
GFR, Estimated: >60 mL/min (ref >60)
Glucose, Bld: 106 mg/dL — ABNORMAL HIGH (ref 70–99)
Potassium: 4.5 mmol/L (ref 3.5–5.1)
Sodium: 143 mmol/L (ref 135–145)
Total Bilirubin: 0.2 mg/dL — ABNORMAL LOW (ref 0.3–1.2)
Total Protein: 6 g/dL — ABNORMAL LOW (ref 6.5–8.1)

## 2020-04-24 LAB — FERRITIN: Ferritin: 117 ng/mL (ref 24–336)

## 2020-04-24 LAB — CBC WITH DIFFERENTIAL (CANCER CENTER ONLY)
Abs Immature Granulocytes: 0.02 10*3/uL (ref 0.00–0.07)
Basophils Absolute: 0 10*3/uL (ref 0.0–0.1)
Basophils Relative: 1 %
Eosinophils Absolute: 0.1 10*3/uL (ref 0.0–0.5)
Eosinophils Relative: 3 %
HCT: 34.1 % — ABNORMAL LOW (ref 39.0–52.0)
Hemoglobin: 10.5 g/dL — ABNORMAL LOW (ref 13.0–17.0)
Immature Granulocytes: 0 %
Lymphocytes Relative: 21 %
Lymphs Abs: 1 10*3/uL (ref 0.7–4.0)
MCH: 33.9 pg (ref 26.0–34.0)
MCHC: 30.8 g/dL (ref 30.0–36.0)
MCV: 110 fL — ABNORMAL HIGH (ref 80.0–100.0)
Monocytes Absolute: 0.3 10*3/uL (ref 0.1–1.0)
Monocytes Relative: 6 %
Neutro Abs: 3.1 10*3/uL (ref 1.7–7.7)
Neutrophils Relative %: 69 %
Platelet Count: 98 10*3/uL — ABNORMAL LOW (ref 150–400)
RBC: 3.1 MIL/uL — ABNORMAL LOW (ref 4.22–5.81)
RDW: 17.3 % — ABNORMAL HIGH (ref 11.5–15.5)
WBC Count: 4.5 10*3/uL (ref 4.0–10.5)
nRBC: 0 % (ref 0.0–0.2)

## 2020-04-24 LAB — IRON AND TIBC
Iron: 64 ug/dL (ref 42–163)
Saturation Ratios: 34 % (ref 20–55)
TIBC: 191 ug/dL — ABNORMAL LOW (ref 202–409)
UIBC: 127 ug/dL (ref 117–376)

## 2020-04-24 LAB — VITAMIN B12: Vitamin B-12: 182 pg/mL (ref 180–914)

## 2020-04-24 NOTE — Progress Notes (Signed)
Hematology and Oncology Follow Up   Keith [name] [id number] 1966-02-28 54 y.o. 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  He presented with clear-cell histology and isolated pulmonary metastasis at the time of diagnosis.  Prior Therapy: 1. Status post laparoscopic radical nephrectomy.  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  2. Patient status post thoracotomy for a synchronous metastatic lung lesions done October 2009.   3. Patient is status post stereotactic radiotherapy to pulmonary nodules in May of 2010. 4. He is S/P Sutent 50 mg 4 weeks on 2 weeks off from 10/2010 to 03/2013. He progressed at that time.  5. He is S/P radiation to the right sacral bone between 4/22 to 4/30.  6. He is S/P XRT to the left shoulder 03/20/14 to 03/31/14. 7. Votrient 800 mg by mouth daily from 03/2013 through 06/22/2015. Discontinued secondary to disease progression. 8. Nivolumab 3 mg/kg given every 2 weeks started on 06/29/2015. He is status post 4 cycles completed 08/10/2015. He developed disease progression in September 2016.  9. Status post radiation therapy to the left mid fibula completed on 11/14/2015. He received a grade 1 fraction. 10. He is status post radiation to the proximal and distal femur over 2 weeks and 10 fractions of total of 30 Gy.  This was completed in March 2019. 11.   Radiation therapy to the right tibia.  Last treatment completed in September 2019.   Current therapy: Cabometyx 60 mg daily started in November 2016.   Interim History: [name] presents today for a follow-up visit.  Since the last visit, he was hospitalized in March 2021 because of symptomatic anemia related to GI bleeding and esophagitis.  No clear-cut other sites of bleeding noted at that time and after brief discontinuation of Xarelto it was resumed  on discharge.  Repeat CBC on April 1 was up to 10.2 currently at 10.9.  Clinically, he reports feeling reasonably well without any recent complaints.  He denies any hematochezia, melena or difficulty breathing.  He denies any worsening diarrhea or abdominal pain.  Formal status quality of life remain excellent.  He denies any new complications related to cabozantinib.    Medications: Reviewed without changes. Current Outpatient Medications  Medication Sig Dispense Refill  . ALPRAZolam (XANAX) 1 MG tablet TAKE 1 TABLET BY MOUTH THREE TIMES A DAY AS NEEDED FOR ANXIETY 90 tablet 0  . CABOMETYX 60 MG tablet TAKE 1 TABLET (60 MG) BY  MOUTH ONE TIME DAILY WITH  AT LEAST 8 OZ OF WATER ON  AN EMPTY STOMACH. DO NOT  EAT FOR 2 HOURS BEFORE OR 1 HOUR A 30 tablet 11  . calcium carbonate (TUMS - DOSED IN MG ELEMENTAL CALCIUM) 500 MG chewable tablet Chew 1 tablet by mouth 3 (three) times daily as needed for indigestion or heartburn.     . DULoxetine (CYMBALTA) 60 MG capsule TAKE 1 CAPSULE (60 MG TOTAL) BY MOUTH 2 (TWO) TIMES DAILY. 180 capsule 0  . ferrous sulfate 325 (65 FE) MG tablet Take 1 tablet (325 mg total) by mouth daily with breakfast. 60 tablet 0  . gabapentin (NEURONTIN) 300 MG capsule TAKE 1 CAPSULE BY MOUTH THREE TIMES A DAY (Patient taking differently: Take 300 mg by mouth 3 (three) times daily. ) 270 capsule 0  . HYDROmorphone (DILAUDID) 8 MG tablet Take  1 tablet (8 mg total) by mouth every 4 (four) hours as needed for severe pain. 30 tablet 0  . hydroxypropyl methylcellulose / hypromellose (ISOPTO TEARS / GONIOVISC) 2.5 % ophthalmic solution Place 1 drop into both eyes 3 (three) times daily as needed for dry eyes.    [name] loratadine (CLARITIN) 10 MG tablet Take 10 mg by mouth daily as needed for allergies.     [name] PAIN MANAGEMENT INTRATHECAL, IT, PUMP 1 each by Intrathecal route continuous. Intrathecal (IT) medication:   Morphine.    . pantoprazole (PROTONIX) 40 MG tablet Take 1 tablet (40 mg total)  by mouth 2 (two) times daily. 30 tablet 11  . rivaroxaban (XARELTO) 20 MG TABS tablet Take 1 tablet (20 mg total) by mouth daily with supper. 90 tablet 3  . sucralfate (CARAFATE) 1 GM/10ML suspension Take 10 mLs (1 g total) by mouth every 6 (six) hours. 1200 mL 0  . tiZANidine (ZANAFLEX) 4 MG tablet Take 1 tablet (4 mg total) by mouth every 8 (eight) hours as needed for muscle spasms. 30 tablet 0   No current facility-administered medications for this visit.     Allergies:  Allergies  Allergen Reactions  . Ceftriaxone Hives  . Hydrocodone Swelling    Physical examination:  Blood pressure (!) 152/89, pulse 70, temperature 98.2 F (36.8 C), temperature source Oral, resp. rate 17, height 6\' 3"  (1.905 m), weight 168 lb 14.4 oz (76.6 kg), SpO2 98 %.    General appearance: Alert, awake without any distress. Head: Atraumatic without abnormalities Oropharynx: Without any thrush or ulcers. Eyes: No scleral icterus. Lymph nodes: No lymphadenopathy noted in the cervical, supraclavicular, or axillary nodes Heart:regular rate and rhythm, without any murmurs or gallops.   Lung: Clear to auscultation without any rhonchi, wheezes or dullness to percussion. Abdomin: Soft, nontender without any shifting dullness or ascites. Musculoskeletal: No clubbing or cyanosis. Neurological: No motor or sensory deficits. Skin: No rashes or lesions. Psychiatric: Mood and affect appeared normal.        Lab Results: Lab Results  Component Value Date   WBC 4.6 03/22/2020   HGB 10.2 (L) 03/22/2020   HCT 32.2 (L) 03/22/2020   MCV 102.8 (H) 03/22/2020   PLT 167.0 03/22/2020     Chemistry      Component Value Date/Time   NA 137 03/02/2020 1034   NA 142 12/24/2017 1317   K 4.7 03/02/2020 1034   K 4.3 12/24/2017 1317   CL 106 03/02/2020 1034   CL 106 05/24/2013 1436   CO2 25 03/02/2020 1034   CO2 32 (H) 12/24/2017 1317   BUN 19 03/02/2020 1034   BUN 10.3 12/24/2017 1317   CREATININE 1.22  03/02/2020 1034   CREATININE 1.22 02/23/2020 1229   CREATININE 0.9 12/24/2017 1317   GLU 160 (H) 06/29/2015 0957      Component Value Date/Time   CALCIUM 7.9 (L) 03/02/2020 1034   CALCIUM 8.8 12/24/2017 1317   ALKPHOS 69 02/28/2020 1636   ALKPHOS 95 12/24/2017 1317   AST 17 02/28/2020 1636   AST 17 02/23/2020 1229   AST 20 12/24/2017 1317   ALT 22 02/28/2020 1636   ALT 20 02/23/2020 1229   ALT 25 12/24/2017 1317   BILITOT 0.4 02/28/2020 1636   BILITOT <0.2 (L) 02/23/2020 1229   BILITOT 0.25 12/24/2017 1317         Impression and Plan:   54 year old man with  1.    Clear-cell renal cell carcinoma diagnosed in 2009 and  currently has stage IV disease with involvement in the bone as well as pulmonary metastasis.  He continues to tolerate cabozantinib without any recent complaints at this time.  Risks and benefits of continuing this therapy long-term was reviewed.  Alternative options were also reiterated including immunotherapy versus other oral therapy.  Combination immunotherapy could also be a possibility down the line.  I plan to repeat CT scan before the next visit to evaluate his disease status.  2.  Pulmonary embolism: Recent thrombosis noted at this time.  He is currently on Xarelto.   3.  Anemia: Related to recent GI blood losses hemoglobin is stable at this time.  He does have element of E45 and folic acid deficiency associated with macrocytosis.  His microcytosis also related to cabozantinib.  I recommended replacement time.   4.  Diarrhea: Related to cabozantinib which appears to be stable on the current therapy.  5.  Anxiety and depression: Overall his mood have been stable with the current therapy.  6.  Prognosis: His disease is incurable although aggressive measures are warranted given his excellent performance status.  Any therapy is palliative however.  7.  Left leg pain: MRI of the left leg showed a 5.4 cm metastatic left fibular mass that has  decreased but a new 1.4 x 1.0 cm in the proximal right fibular metadiaphysis.  He has been referred to Dr. Tammi Klippel for radiation but has declined it for the time being.   8.  Follow-up: Will be in 2 months after repeat imaging studies.   30  minutes were spent on this encounter.  The time was dedicated to addressing his disease status, discussing treatment options, risk complications related to his cancer and cancer therapy.   Zola Button, MD 04/24/2020 8:03 AM

## 2020-04-25 ENCOUNTER — Telehealth: Payer: Self-pay | Admitting: Oncology

## 2020-04-25 NOTE — Telephone Encounter (Signed)
Scheduled appt per 5/4 los.  Spoke with pt and he is aware of his appt date and time.

## 2020-04-28 ENCOUNTER — Ambulatory Visit: Payer: Medicare Other | Attending: Internal Medicine

## 2020-04-28 DIAGNOSIS — Z23 Encounter for immunization: Secondary | ICD-10-CM

## 2020-04-28 NOTE — Progress Notes (Signed)
   Covid-19 Vaccination Clinic  Name:  Keith [name]    MRN: [id number] DOB: 1966/11/27  04/28/2020  Mr. [name] was observed post Covid-19 immunization for 15 minutes without incident. He was provided with Vaccine Information Sheet and instruction to access the V-Safe system.   Mr. [name] was instructed to call 911 with any severe reactions post vaccine: [name] Difficulty breathing  . Swelling of face and throat  . A fast heartbeat  . A bad rash all over body  . Dizziness and weakness   Immunizations Administered    Name Date Dose VIS Date Route   Moderna COVID-19 Vaccine 04/28/2020  8:31 AM 0.5 mL 11/2019 Intramuscular   Manufacturer: Moderna   Lot: [id number]   [place]: 61443-154-00

## 2020-05-01 ENCOUNTER — Ambulatory Visit: Payer: Medicare Other

## 2020-05-01 ENCOUNTER — Telehealth: Payer: Self-pay

## 2020-05-01 NOTE — Telephone Encounter (Signed)
-----   Message from [name], MD sent at 05/01/2020  9:36 AM EDT ----- He needs to go to his closest emergency department.Thanks ----- Message ----- From: Tami Lin, RN Sent: 05/01/2020   9:26 AM EDT To: [name], MD  Patient's wife called to report patient has been vomiting x 2 days. She describes it as "yellow and foamy". She states the patient is not eating, is drinking a little bit but vomits after drinking. She states patient also has severe diarrhea but she states patient has diarrhea all the time. Patient got his 2nd COVID vaccine on Saturday. Per wife patient is just "really sick". [name]

## 2020-05-01 NOTE — Telephone Encounter (Signed)
Called patient's wife and let her know that per Dr. [name], patient needs to go to the closest Emergency Department. She verbalized understanding and stated she will take patient to the closest ED.

## 2020-05-02 ENCOUNTER — Ambulatory Visit: Payer: Medicare Other

## 2020-05-02 ENCOUNTER — Telehealth: Payer: Self-pay | Admitting: Gastroenterology

## 2020-05-02 MED ORDER — FERROUS SULFATE 325 (65 FE) MG PO TABS: 325.0000 mg | ORAL_TABLET | Freq: Every day | ORAL | 3 refills | Status: DC

## 2020-05-02 NOTE — Telephone Encounter (Signed)
Should we refill this or should this be refilled by hematology/oncology?

## 2020-05-02 NOTE — Telephone Encounter (Signed)
Pt needs rf for ferrous sulfate 325 mg sent to [facility] in [place].

## 2020-05-02 NOTE — Telephone Encounter (Signed)
We can refill it.  Give a few refills.  Thank you,  Jess

## 2020-05-02 NOTE — Telephone Encounter (Signed)
Refills have been sent into the [facility] in [facility]

## 2020-05-10 ENCOUNTER — Other Ambulatory Visit: Payer: Self-pay | Admitting: Oncology

## 2020-05-13 ENCOUNTER — Other Ambulatory Visit: Payer: Self-pay | Admitting: Oncology

## 2020-05-21 ENCOUNTER — Other Ambulatory Visit: Payer: Self-pay | Admitting: Oncology

## 2020-06-02 IMAGING — MR MR [PERSON_NAME] LOW WO/W CM*R*
4 of 8 series · 22 of 40 positions shown · IV contrast (Y)
Comparison: None.

CLINICAL DATA: Stage IV kidney cancer. Right lower leg and ankle
pain.

EXAM:
MRI OF LOWER RIGHT EXTREMITY WITHOUT AND WITH CONTRAST
TECHNIQUE: Multiplanar, multisequence MR imaging of the right lower leg was
performed both before and after administration of intravenous
contrast.
CONTRAST:  18mL MULTIHANCE GADOBENATE DIMEGLUMINE 529 MG/ML IV SOLN

[Series 4: T1 · coronal · 4.0mm · 0.82mm/px · 2 of 22 slices shown (1 of 2)]
[im 1/22]
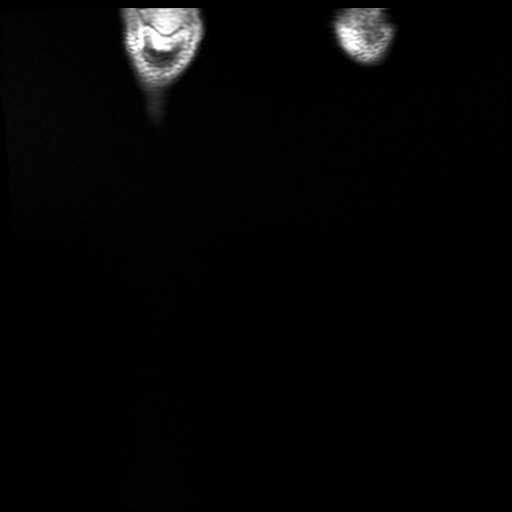
[im 22/22]
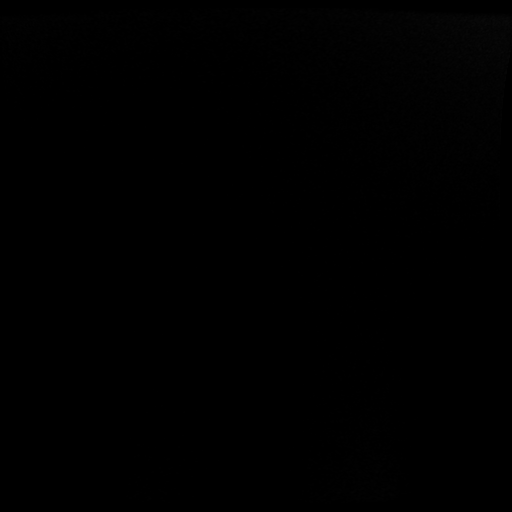

[Series 6: T1 · axial · 4.0mm · 0.47mm/px · z∈[-244,+199]mm · 8 of 75 slices shown (2 of 2)]
[im 1/75]
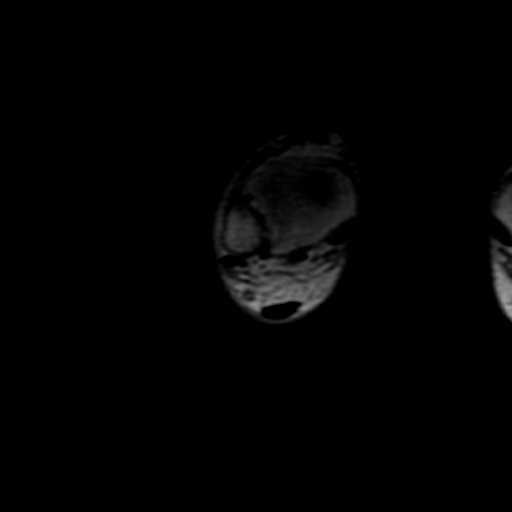
[im 11/75]
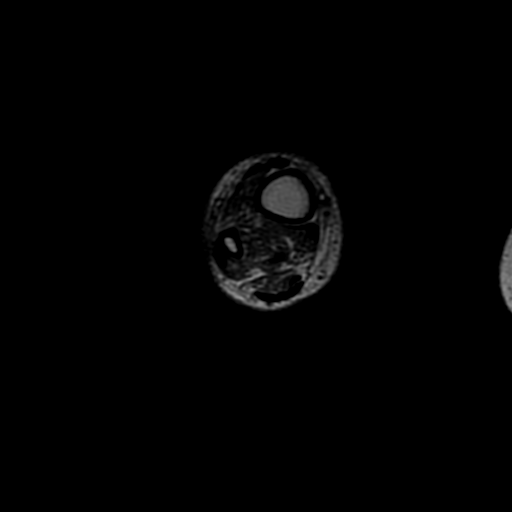
[im 22/75]
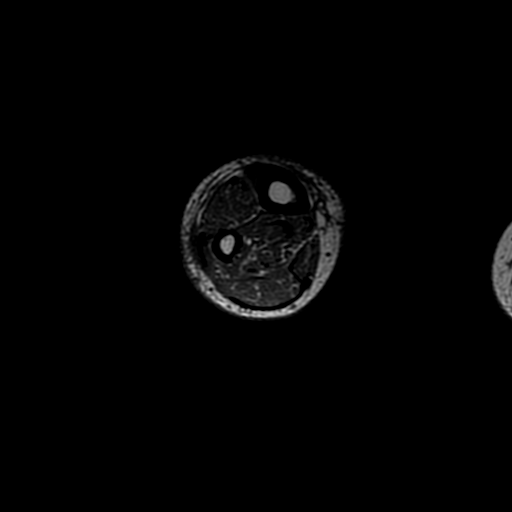
[im 32/75]
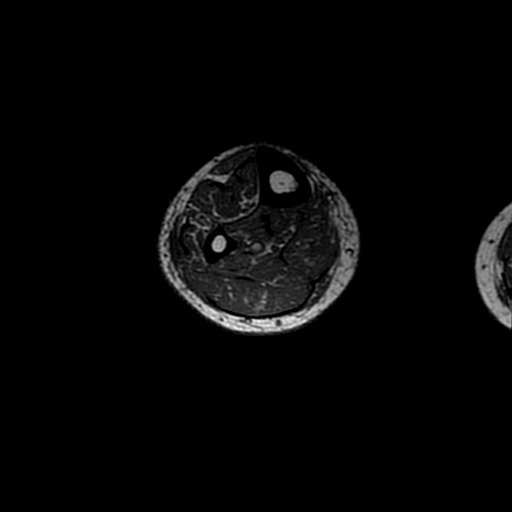
[im 43/75]
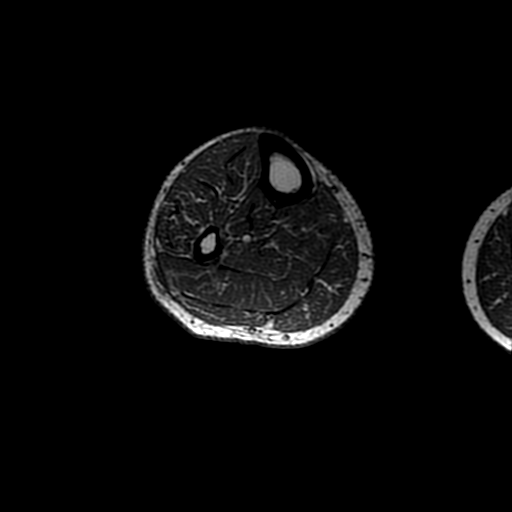
[im 53/75]
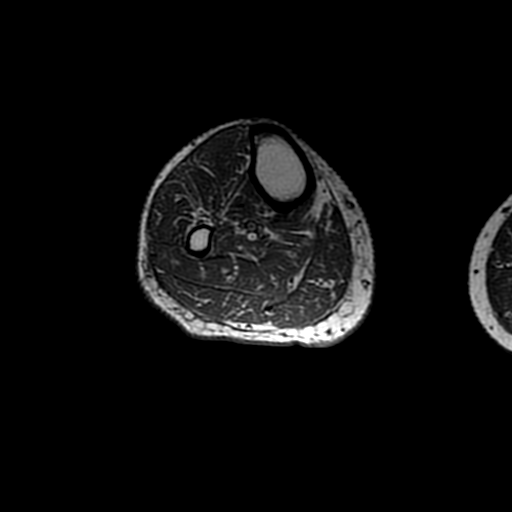
[im 64/75]
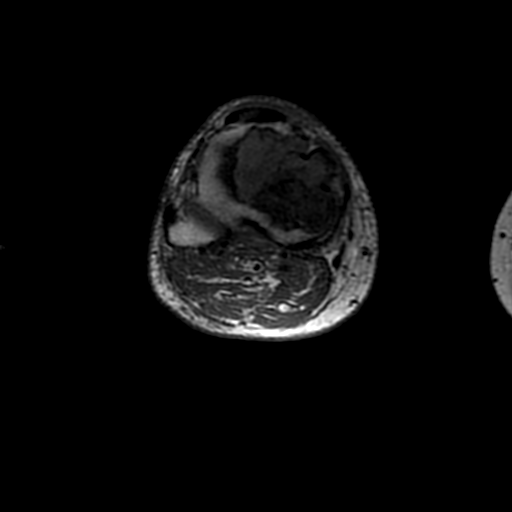
[im 75/75]
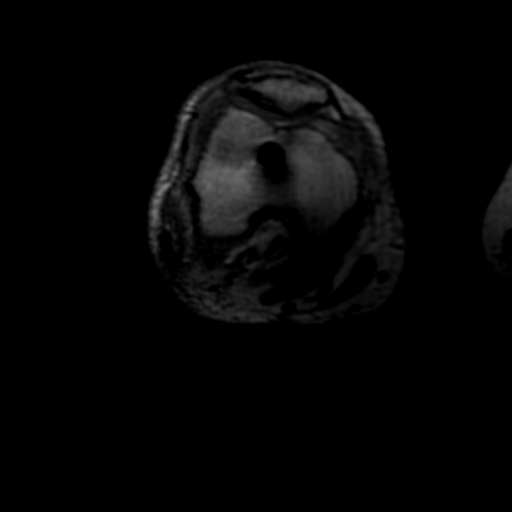

[Series 7: T2 fat-sat · axial · 4.0mm · 0.47mm/px · z∈[-244,+199]mm · 8 of 75 slices shown]
[im 1/75]
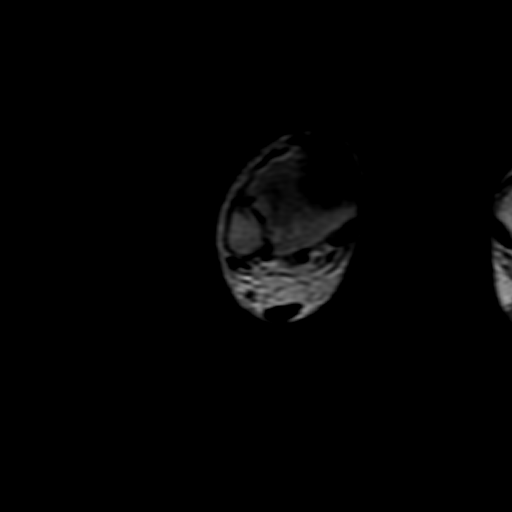
[im 11/75]
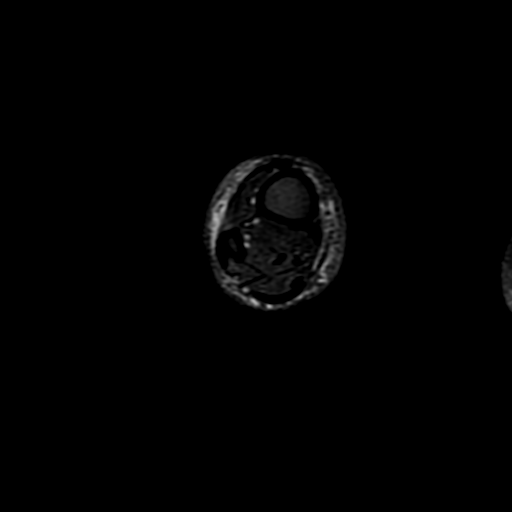
[im 22/75]
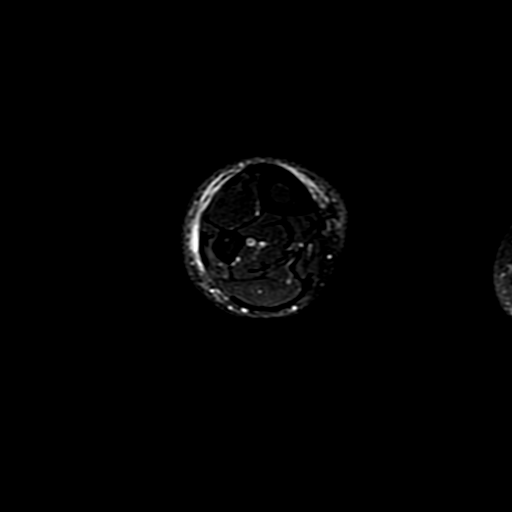
[im 32/75]
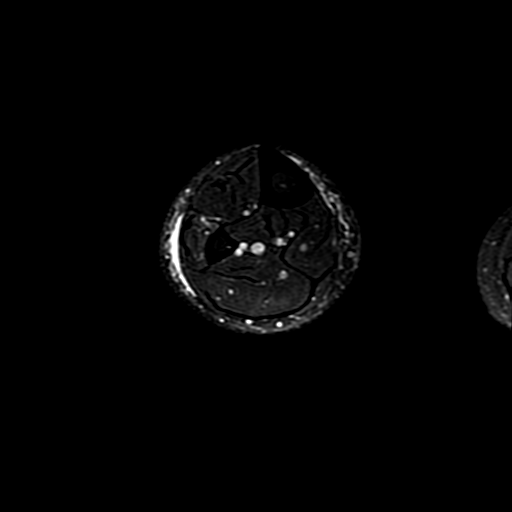
[im 43/75]
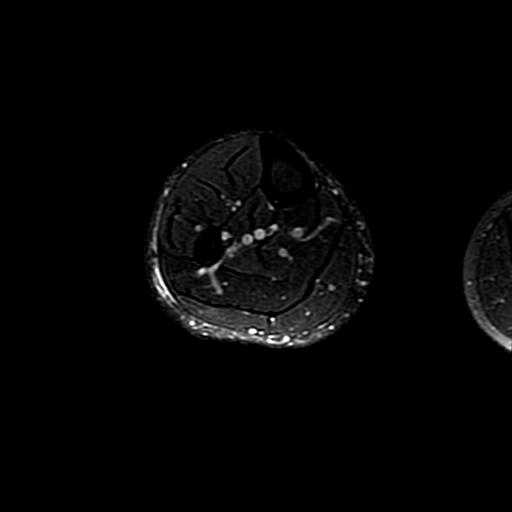
[im 53/75]
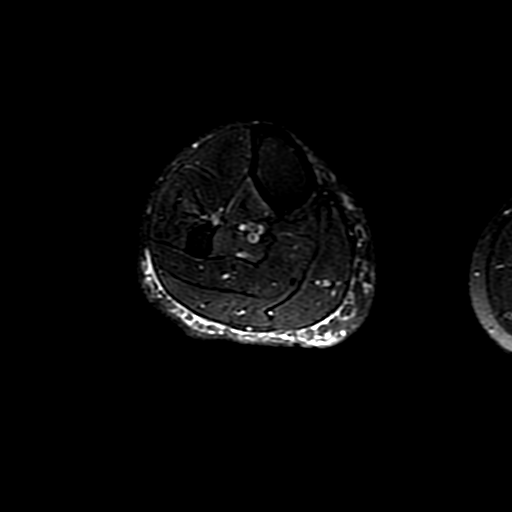
[im 64/75]
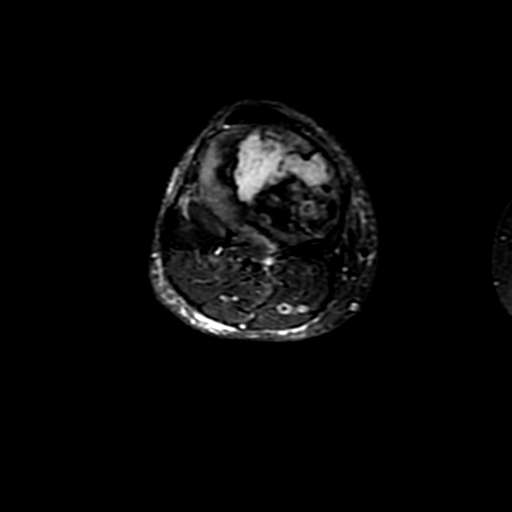
[im 75/75]
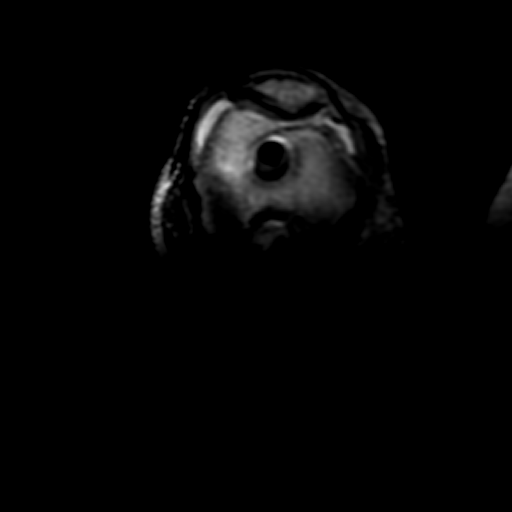

[Series 8: T1 fat-sat · axial · 4.0mm · 0.94mm/px · z∈[-244,+133]mm · 4 of 75 slices shown]
[im 1/75]
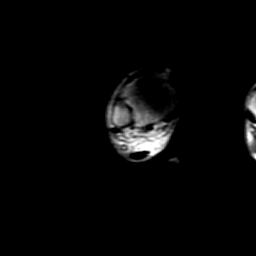
[im 11/75]
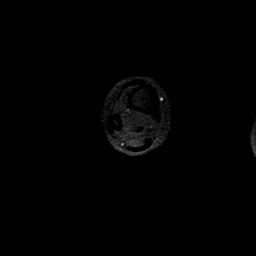
[im 43/75]
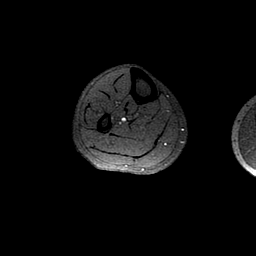
[im 64/75]
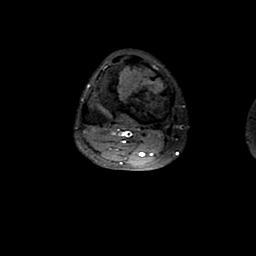

[22 of 40 positions shown; findings below may reference images not displayed]

FINDINGS: Bones/Joint/Cartilage

No fracture or dislocation. Normal alignment. No joint effusion.

4.2 x 6.5 x 5.5 cm expansile bone lesion in the proximal right
tibial epiphysis and metaphysis most consistent with metastatic
renal cell carcinoma with adjacent marrow edema.

5 x 3.7 cm expansile mass involving the mid left fibular diaphysis
most consistent with metastatic renal cell carcinoma.

Partial-thickness cartilage loss of the medial and lateral
femorotibial compartments of the right knee.

Ligaments

Collateral ligaments are intact.

Muscles and Tendons
Muscles are normal.  No muscle atrophy.

Soft tissue
No fluid collection or hematoma.  No soft tissue mass.
IMPRESSION: 1. 4.2 x 6.5 x 5.5 cm expansile bone lesion in the proximal right
tibial epiphysis and metaphysis most consistent with metastatic
renal cell carcinoma with adjacent marrow edema.
2. 5 x 3.7 cm expansile mass involving the mid left fibular
diaphysis most consistent with metastatic renal cell carcinoma.

## 2020-06-02 IMAGING — MR MR ANKLE*R* WO/W CM
4 of 8 series · 19 of 40 positions shown · IV contrast (multihance)
Comparison: None.

CLINICAL DATA: Kidney cancer.  Leg and ankle pain.

EXAM:
MRI OF THE RIGHT ANKLE WITHOUT AND WITH CONTRAST
TECHNIQUE: Multiplanar, multisequence MR imaging of the ankle was performed
before and after the administration of intravenous contrast.
CONTRAST:  18mL MULTIHANCE GADOBENATE DIMEGLUMINE 529 MG/ML IV SOLN

[Series 2: T1 fat-sat post-contrast · coronal · 3.0mm · 0.35mm/px · 3 of 50 slices shown]
[im 9/50]
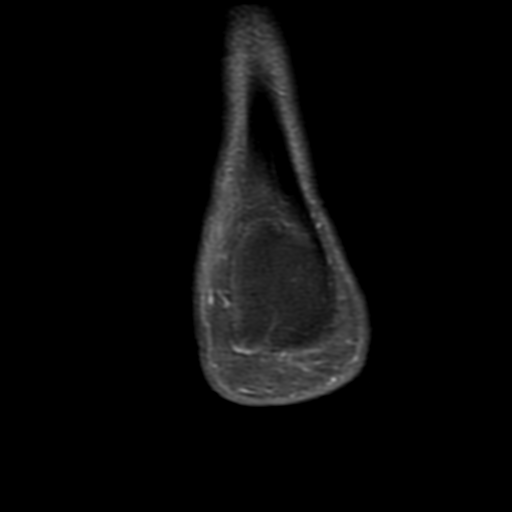
[im 25/50]
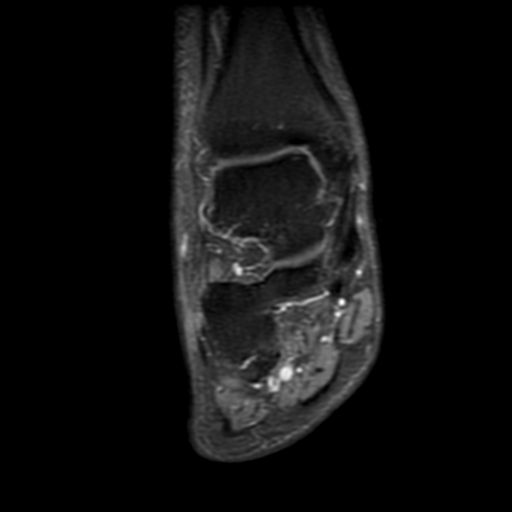
[im 41/50]
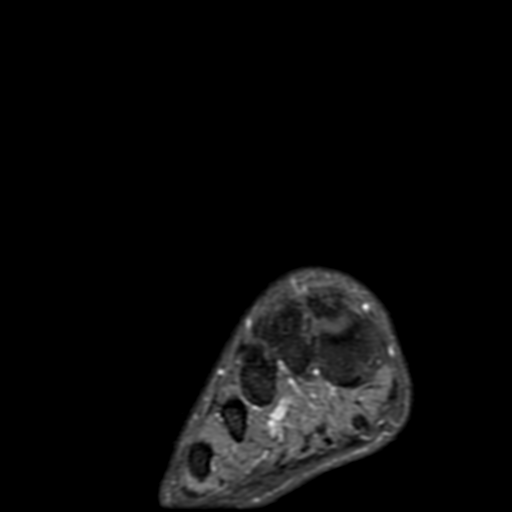

[Series 4: T2 fat-sat · axial · 3.0mm · 0.39mm/px · z∈[-94,+78]mm · 5 of 44 slices shown (1 of 2)]
[im 1/44]
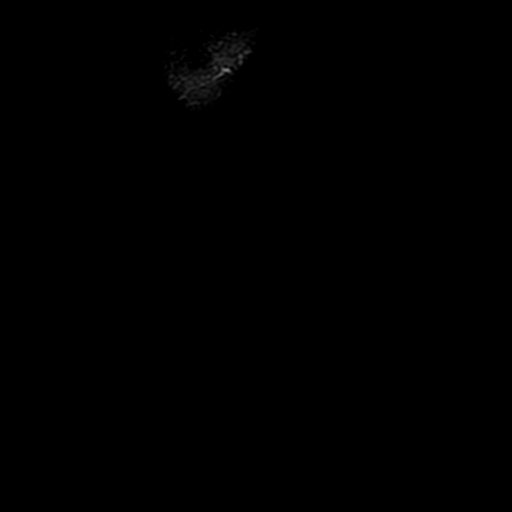
[im 11/44]
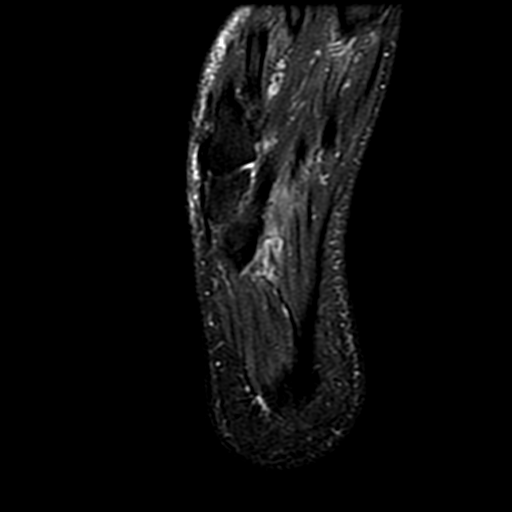
[im 22/44]
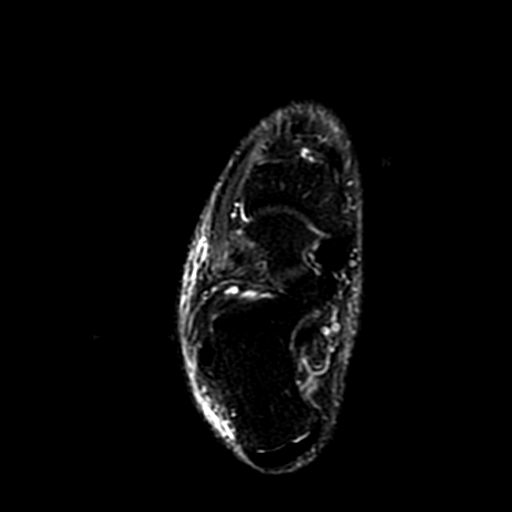
[im 33/44]
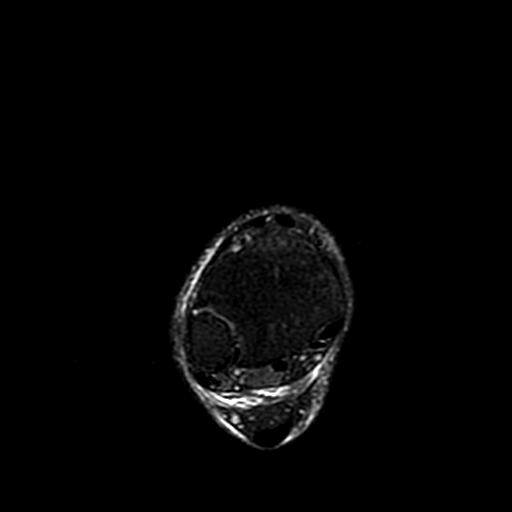
[im 44/44]
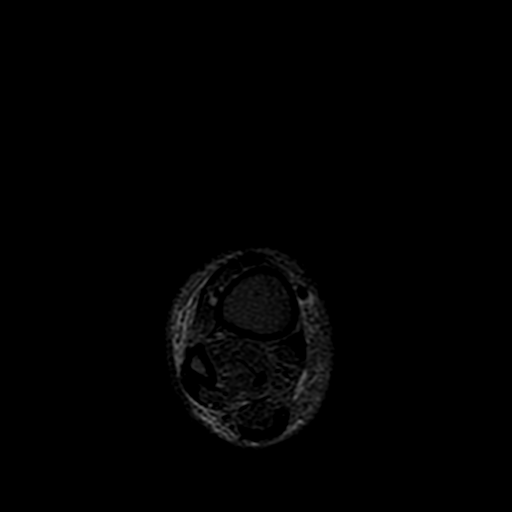

[Series 6: T2 fat-sat · coronal · 3.0mm · 0.35mm/px · 5 of 50 slices shown (2 of 2)]
[im 1/50]
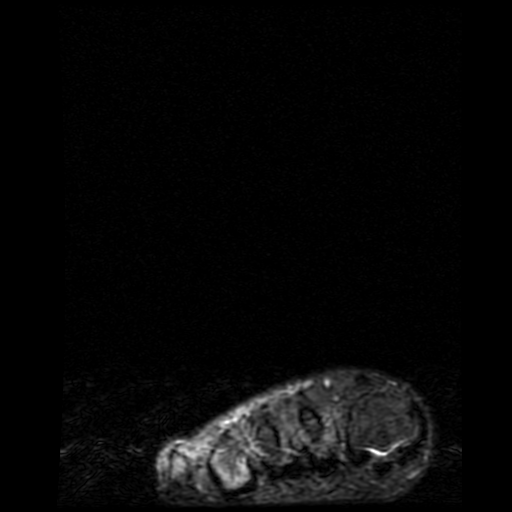
[im 10/50]
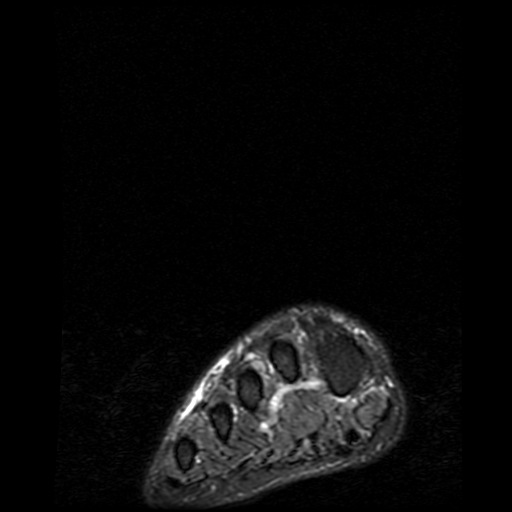
[im 20/50]
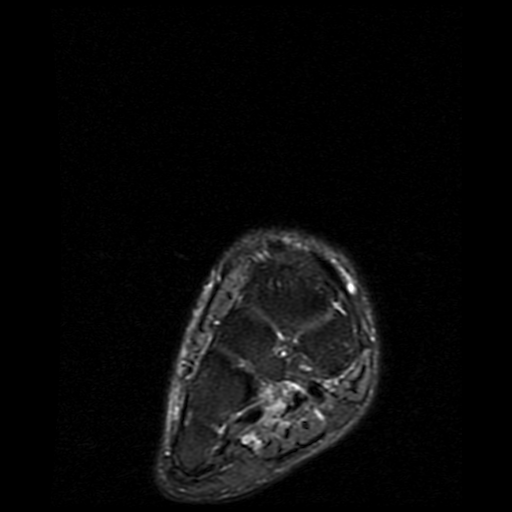
[im 30/50]
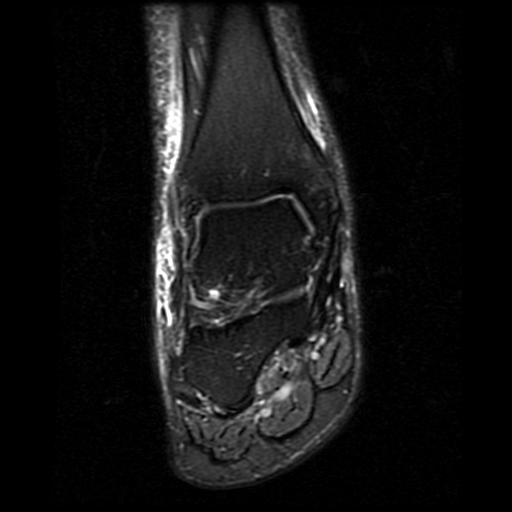
[im 50/50]
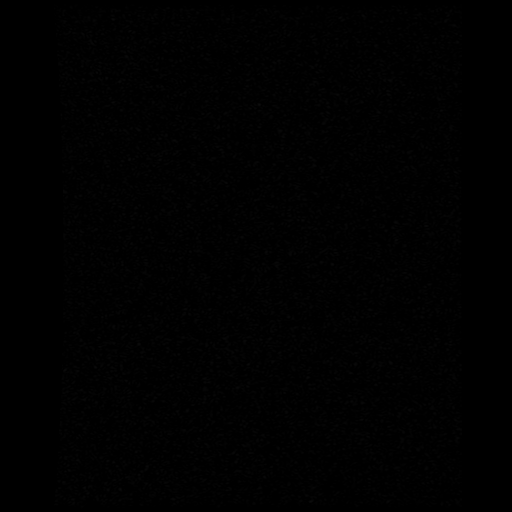

[Series 7: PD fat-sat · coronal · 3.0mm · 0.35mm/px · 6 of 50 slices shown]
[im 1/50]
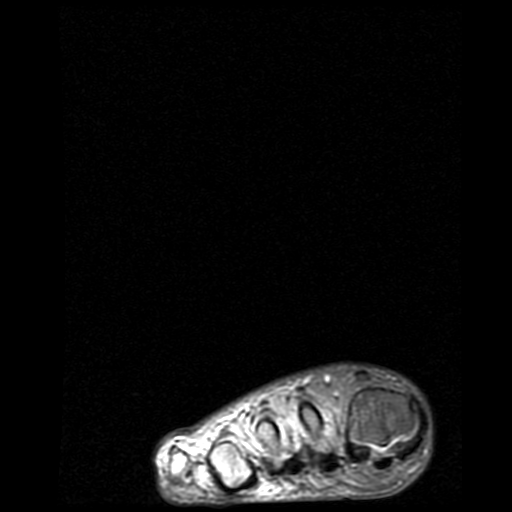
[im 10/50]
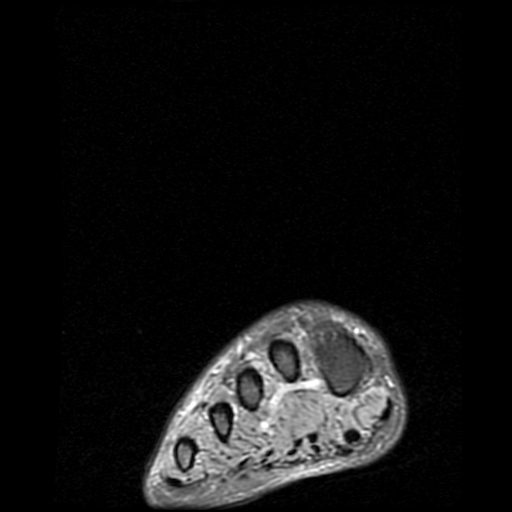
[im 20/50]
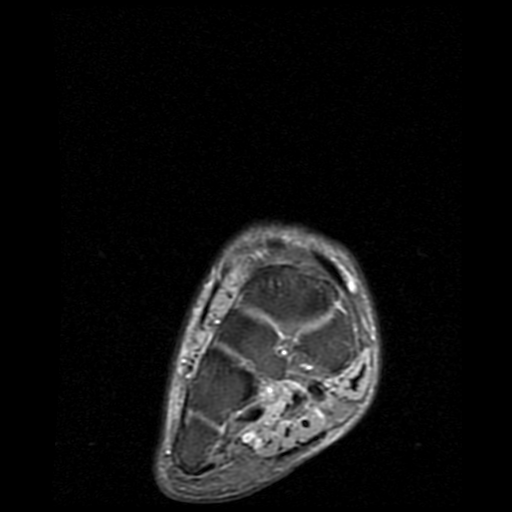
[im 30/50]
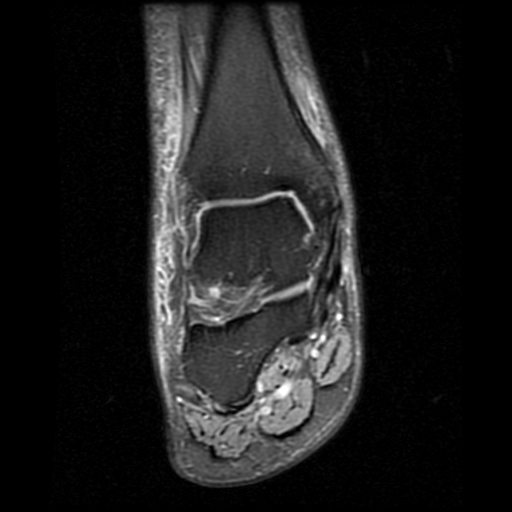
[im 40/50]
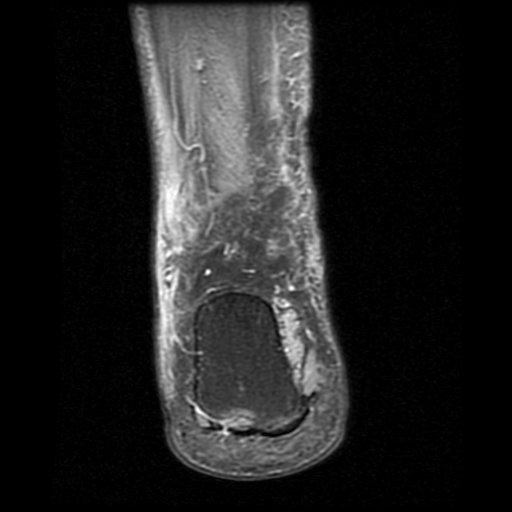
[im 50/50]
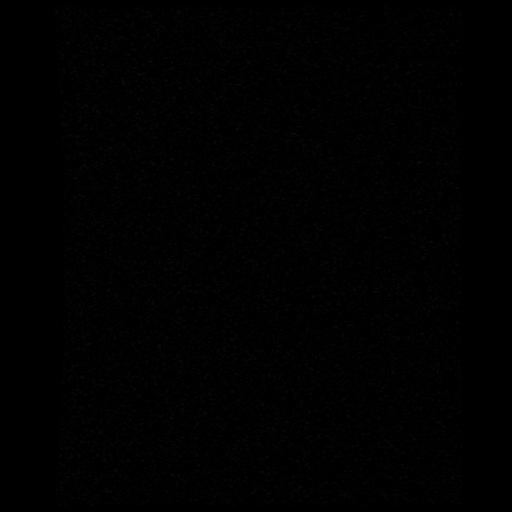

[19 of 40 positions shown; findings below may reference images not displayed]

FINDINGS: TENDONS

Peroneal: Peroneal longus tendon intact. Peroneal brevis intact.

Posteromedial: Posterior tibial tendon intact. Flexor hallucis
longus tendon intact. Flexor digitorum longus tendon intact.

Anterior: Tibialis anterior tendon intact. Extensor hallucis longus
tendon intact Extensor digitorum longus tendon intact.

Achilles:  Intact.

Plantar Fascia: Intact.

LIGAMENTS

Lateral: Anterior talofibular ligament intact. Calcaneofibular
ligament intact. Posterior talofibular ligament intact. Anterior and
posterior tibiofibular ligaments intact.

Medial: Deltoid ligament intact. Spring ligament intact.

CARTILAGE

Ankle Joint: No joint effusion. Normal ankle mortise. No chondral
defect.

Subtalar Joints/Sinus Tarsi: Normal subtalar joints. No subtalar
joint effusion. Normal sinus tarsi.

Bones: No aggressive osseous lesion. Subcortical reactive marrow
changes in the medial talus adjacent to the medial malleolus. No
fracture or dislocation.

Soft Tissue: Mild muscle edema in the quadratus plantae muscle.
IMPRESSION: IMPRESSION
1. No aggressive osseous lesion to suggest metastatic disease.
2.  No acute osseous injury of the right ankle.

## 2020-06-13 ENCOUNTER — Other Ambulatory Visit: Payer: Self-pay | Admitting: Oncology

## 2020-06-13 DIAGNOSIS — C649 Malignant neoplasm of unspecified kidney, except renal pelvis: Secondary | ICD-10-CM

## 2020-06-15 ENCOUNTER — Inpatient Hospital Stay: Payer: Medicare Other

## 2020-06-19 ENCOUNTER — Other Ambulatory Visit: Payer: Self-pay

## 2020-06-19 ENCOUNTER — Ambulatory Visit (HOSPITAL_COMMUNITY)
Admission: RE | Admit: 2020-06-19 | Discharge: 2020-06-19 | Disposition: A | Discharge status: Home / Self Care | Payer: Medicare Other | Source: Ambulatory Visit | Attending: Oncology | Admitting: Oncology

## 2020-06-19 ENCOUNTER — Inpatient Hospital Stay: Payer: Medicare Other | Attending: Oncology

## 2020-06-19 DIAGNOSIS — Z905 Acquired absence of kidney: Secondary | ICD-10-CM | POA: Insufficient documentation

## 2020-06-19 DIAGNOSIS — Z9221 Personal history of antineoplastic chemotherapy: Secondary | ICD-10-CM | POA: Diagnosis not present

## 2020-06-19 DIAGNOSIS — C7889 Secondary malignant neoplasm of other digestive organs: Secondary | ICD-10-CM | POA: Insufficient documentation

## 2020-06-19 DIAGNOSIS — R918 Other nonspecific abnormal finding of lung field: Secondary | ICD-10-CM | POA: Diagnosis not present

## 2020-06-19 DIAGNOSIS — R200 Anesthesia of skin: Secondary | ICD-10-CM | POA: Diagnosis not present

## 2020-06-19 DIAGNOSIS — M5136 Other intervertebral disc degeneration, lumbar region: Secondary | ICD-10-CM | POA: Diagnosis not present

## 2020-06-19 DIAGNOSIS — J90 Pleural effusion, not elsewhere classified: Secondary | ICD-10-CM | POA: Insufficient documentation

## 2020-06-19 DIAGNOSIS — I712 Thoracic aortic aneurysm, without rupture: Secondary | ICD-10-CM | POA: Insufficient documentation

## 2020-06-19 DIAGNOSIS — Z79899 Other long term (current) drug therapy: Secondary | ICD-10-CM | POA: Insufficient documentation

## 2020-06-19 DIAGNOSIS — I700 Atherosclerosis of aorta: Secondary | ICD-10-CM | POA: Insufficient documentation

## 2020-06-19 DIAGNOSIS — R197 Diarrhea, unspecified: Secondary | ICD-10-CM | POA: Diagnosis not present

## 2020-06-19 DIAGNOSIS — F329 Major depressive disorder, single episode, unspecified: Secondary | ICD-10-CM | POA: Diagnosis not present

## 2020-06-19 DIAGNOSIS — F419 Anxiety disorder, unspecified: Secondary | ICD-10-CM | POA: Diagnosis not present

## 2020-06-19 DIAGNOSIS — R601 Generalized edema: Secondary | ICD-10-CM | POA: Insufficient documentation

## 2020-06-19 DIAGNOSIS — C7800 Secondary malignant neoplasm of unspecified lung: Secondary | ICD-10-CM | POA: Insufficient documentation

## 2020-06-19 DIAGNOSIS — C642 Malignant neoplasm of left kidney, except renal pelvis: Secondary | ICD-10-CM | POA: Diagnosis not present

## 2020-06-19 DIAGNOSIS — I2699 Other pulmonary embolism without acute cor pulmonale: Secondary | ICD-10-CM | POA: Insufficient documentation

## 2020-06-19 DIAGNOSIS — G894 Chronic pain syndrome: Secondary | ICD-10-CM | POA: Diagnosis not present

## 2020-06-19 DIAGNOSIS — C649 Malignant neoplasm of unspecified kidney, except renal pelvis: Secondary | ICD-10-CM

## 2020-06-19 DIAGNOSIS — Z7901 Long term (current) use of anticoagulants: Secondary | ICD-10-CM | POA: Insufficient documentation

## 2020-06-19 DIAGNOSIS — Z923 Personal history of irradiation: Secondary | ICD-10-CM | POA: Insufficient documentation

## 2020-06-19 DIAGNOSIS — R188 Other ascites: Secondary | ICD-10-CM | POA: Diagnosis not present

## 2020-06-19 DIAGNOSIS — J439 Emphysema, unspecified: Secondary | ICD-10-CM | POA: Diagnosis not present

## 2020-06-19 LAB — CBC WITH DIFFERENTIAL (CANCER CENTER ONLY)
Abs Immature Granulocytes: 0.02 10*3/uL (ref 0.00–0.07)
Basophils Absolute: 0 10*3/uL (ref 0.0–0.1)
Basophils Relative: 1 %
Eosinophils Absolute: 0.2 10*3/uL (ref 0.0–0.5)
Eosinophils Relative: 3 %
HCT: 37 % — ABNORMAL LOW (ref 39.0–52.0)
Hemoglobin: 11.1 g/dL — ABNORMAL LOW (ref 13.0–17.0)
Immature Granulocytes: 0 %
Lymphocytes Relative: 13 %
Lymphs Abs: 0.9 10*3/uL (ref 0.7–4.0)
MCH: 33.7 pg (ref 26.0–34.0)
MCHC: 30 g/dL (ref 30.0–36.0)
MCV: 112.5 fL — ABNORMAL HIGH (ref 80.0–100.0)
Monocytes Absolute: 0.4 10*3/uL (ref 0.1–1.0)
Monocytes Relative: 6 %
Neutro Abs: 5.7 10*3/uL (ref 1.7–7.7)
Neutrophils Relative %: 77 %
Platelet Count: 99 10*3/uL — ABNORMAL LOW (ref 150–400)
RBC: 3.29 MIL/uL — ABNORMAL LOW (ref 4.22–5.81)
RDW: 14.6 % (ref 11.5–15.5)
WBC Count: 7.3 10*3/uL (ref 4.0–10.5)
nRBC: 0 % (ref 0.0–0.2)

## 2020-06-19 LAB — CMP (CANCER CENTER ONLY)
ALT: 20 U/L (ref 0–44)
AST: 19 U/L (ref 15–41)
Albumin: 2.7 g/dL — ABNORMAL LOW (ref 3.5–5.0)
Alkaline Phosphatase: 73 U/L (ref 38–126)
Anion gap: 7 (ref 5–15)
BUN: 16 mg/dL (ref 6–20)
CO2: 25 mmol/L (ref 22–32)
Calcium: 8.1 mg/dL — ABNORMAL LOW (ref 8.9–10.3)
Chloride: 109 mmol/L (ref 98–111)
Creatinine: 1.31 mg/dL — ABNORMAL HIGH (ref 0.61–1.24)
GFR, Est AFR Am: >60 mL/min (ref >60)
GFR, Estimated: >60 mL/min (ref >60)
Glucose, Bld: 90 mg/dL (ref 70–99)
Potassium: 4.1 mmol/L (ref 3.5–5.1)
Sodium: 141 mmol/L (ref 135–145)
Total Bilirubin: 0.3 mg/dL (ref 0.3–1.2)
Total Protein: 6.1 g/dL — ABNORMAL LOW (ref 6.5–8.1)

## 2020-06-19 MED ORDER — IOHEXOL 300 MG/ML  SOLN
100.0000 mL | Freq: Once | INTRAMUSCULAR | Status: AC | PRN
  Administered 2020-06-19: 100 mL via INTRAVENOUS

## 2020-06-19 MED ORDER — SODIUM CHLORIDE (PF) 0.9 % IJ SOLN
INTRAMUSCULAR | Status: AC
  Filled 2020-06-19: qty 50

## 2020-06-20 ENCOUNTER — Other Ambulatory Visit: Payer: Self-pay

## 2020-06-20 ENCOUNTER — Inpatient Hospital Stay (HOSPITAL_BASED_OUTPATIENT_CLINIC_OR_DEPARTMENT_OTHER): Payer: Medicare Other | Admitting: Oncology

## 2020-06-20 VITALS — BP 170/99 | HR 72 | Temp 98.1°F | Resp 18 | Ht 75.0 in | Wt 174.3 lb

## 2020-06-20 DIAGNOSIS — Z7901 Long term (current) use of anticoagulants: Secondary | ICD-10-CM | POA: Diagnosis not present

## 2020-06-20 DIAGNOSIS — R200 Anesthesia of skin: Secondary | ICD-10-CM | POA: Diagnosis not present

## 2020-06-20 DIAGNOSIS — I2699 Other pulmonary embolism without acute cor pulmonale: Secondary | ICD-10-CM | POA: Diagnosis not present

## 2020-06-20 DIAGNOSIS — Z905 Acquired absence of kidney: Secondary | ICD-10-CM | POA: Diagnosis not present

## 2020-06-20 DIAGNOSIS — C7800 Secondary malignant neoplasm of unspecified lung: Secondary | ICD-10-CM | POA: Diagnosis not present

## 2020-06-20 DIAGNOSIS — G620 Drug-induced polyneuropathy: Secondary | ICD-10-CM | POA: Diagnosis not present

## 2020-06-20 DIAGNOSIS — Z923 Personal history of irradiation: Secondary | ICD-10-CM | POA: Diagnosis not present

## 2020-06-20 DIAGNOSIS — Z9221 Personal history of antineoplastic chemotherapy: Secondary | ICD-10-CM | POA: Diagnosis not present

## 2020-06-20 DIAGNOSIS — R601 Generalized edema: Secondary | ICD-10-CM | POA: Diagnosis not present

## 2020-06-20 DIAGNOSIS — M79662 Pain in left lower leg: Secondary | ICD-10-CM | POA: Diagnosis not present

## 2020-06-20 DIAGNOSIS — Z9689 Presence of other specified functional implants: Secondary | ICD-10-CM | POA: Diagnosis not present

## 2020-06-20 DIAGNOSIS — R197 Diarrhea, unspecified: Secondary | ICD-10-CM | POA: Diagnosis not present

## 2020-06-20 DIAGNOSIS — Z79899 Other long term (current) drug therapy: Secondary | ICD-10-CM | POA: Diagnosis not present

## 2020-06-20 DIAGNOSIS — C642 Malignant neoplasm of left kidney, except renal pelvis: Secondary | ICD-10-CM | POA: Diagnosis not present

## 2020-06-20 DIAGNOSIS — C649 Malignant neoplasm of unspecified kidney, except renal pelvis: Secondary | ICD-10-CM | POA: Diagnosis not present

## 2020-06-20 NOTE — Progress Notes (Signed)
Hematology and Oncology Follow Up   Keith [name] [id number] 07/16/1966 54 y.o. 06/20/2020 12:12 PM [name], MDPharr, [name], MD       Principle Diagnosis: 54 year old man with  clear-cell renal cell carcinoma diagnosed in 2009.  He was found to have stage IV disease with pulmonary involvement at the time of diagnosis. Prior Therapy: 1. Status post laparoscopic radical nephrectomy.  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  2. Patient status post thoracotomy for a synchronous metastatic lung lesions done October 2009.   3. Patient is status post stereotactic radiotherapy to pulmonary nodules in May of 2010. 4. He is S/P Sutent 50 mg 4 weeks on 2 weeks off from 10/2010 to 03/2013. He progressed at that time.  5. He is S/P radiation to the right sacral bone between 4/22 to 4/30.  6. He is S/P XRT to the left shoulder 03/20/14 to 03/31/14. 7. Votrient 800 mg by mouth daily from 03/2013 through 06/22/2015. Discontinued secondary to disease progression. 8. Nivolumab 3 mg/kg given every 2 weeks started on 06/29/2015. He is status post 4 cycles completed 08/10/2015. He developed disease progression in September 2016.  9. Status post radiation therapy to the left mid fibula completed on 11/14/2015. He received a grade 1 fraction. 10. He is status post radiation to the proximal and distal femur over 2 weeks and 10 fractions of total of 30 Gy.  This was completed in March 2019. 11.   Radiation therapy to the right tibia.  Last treatment completed in September 2019.   Current therapy: Cabometyx 60 mg daily started in November 2016.   Interim History: Keith [name] returns today for a repeat evaluation.  Since the last visit, he reports increase in his lower extremity edema is noted in the last 2 months.  He had denied any shortness of breath, cough or wheezing.  Denies any abdominal pain or early satiety.  He does report some pain and numbness in his lower extremities.   He continues to tolerate Cabometyx without any new complaints.  Denies any worsening diarrhea or excessive fatigue.  His mobility has been limited although he still able to work around his house.    Medications: Updated on review. Current Outpatient Medications  Medication Sig Dispense Refill  . ALPRAZolam (XANAX) 1 MG tablet TAKE 1 TABLET BY MOUTH THREE TIMES A DAY AS NEEDED FOR ANXIETY 90 tablet 0  . CABOMETYX 60 MG tablet TAKE 1 TABLET (60 MG) BY  MOUTH ONE TIME DAILY WITH  AT LEAST 8 OZ OF WATER ON  AN EMPTY STOMACH. DO NOT  EAT FOR 2 HOURS BEFORE OR 1 HOUR A 30 tablet 11  . calcium carbonate (TUMS - DOSED IN MG ELEMENTAL CALCIUM) 500 MG chewable tablet Chew 1 tablet by mouth 3 (three) times daily as needed for indigestion or heartburn.     . DULoxetine (CYMBALTA) 60 MG capsule TAKE 1 CAPSULE (60 MG TOTAL) BY MOUTH 2 (TWO) TIMES DAILY. 180 capsule 0  . ferrous sulfate 325 (65 FE) MG tablet Take 1 tablet (325 mg total) by mouth daily with breakfast. 60 tablet 3  . gabapentin (NEURONTIN) 300 MG capsule TAKE 1 CAPSULE BY MOUTH THREE TIMES A DAY 270 capsule 0  . HYDROmorphone (DILAUDID) 8 MG tablet Take 1 tablet (8 mg total) by mouth every 4 (four) hours as needed for severe pain. 30 tablet 0  . hydroxypropyl methylcellulose / hypromellose (ISOPTO TEARS / GONIOVISC) 2.5 % ophthalmic solution Place 1  drop into both eyes 3 (three) times daily as needed for dry eyes.    [name] loratadine (CLARITIN) 10 MG tablet Take 10 mg by mouth daily as needed for allergies.     [name] PAIN MANAGEMENT INTRATHECAL, IT, PUMP 1 each by Intrathecal route continuous. Intrathecal (IT) medication:   Morphine.    . pantoprazole (PROTONIX) 40 MG tablet Take 1 tablet (40 mg total) by mouth 2 (two) times daily. 30 tablet 11  . sucralfate (CARAFATE) 1 GM/10ML suspension Take 10 mLs (1 g total) by mouth every 6 (six) hours. 1200 mL 0  . tiZANidine (ZANAFLEX) 4 MG tablet Take 1 tablet (4 mg total) by mouth every 8 (eight) hours as needed  for muscle spasms. 30 tablet 0  . XARELTO 20 MG TABS tablet TAKE 1 TABLET (20 MG TOTAL) BY MOUTH DAILY WITH SUPPER. 90 tablet 3   No current facility-administered medications for this visit.     Allergies:  Allergies  Allergen Reactions  . Ceftriaxone Hives  . Hydrocodone Swelling    Physical examination:  Blood pressure (!) 170/99, pulse 72, temperature 98.1 F (36.7 C), temperature source Temporal, resp. rate 18, height 6\' 3"  (1.905 m), weight 174 lb 4.8 oz (79.1 kg), SpO2 98 %.   ECOG 1   General appearance: Comfortable appearing without any discomfort Head: Normocephalic without any trauma Oropharynx: Mucous membranes are moist and pink without any thrush or ulcers. Eyes: Pupils are equal and round reactive to light. Lymph nodes: No cervical, supraclavicular, inguinal or axillary lymphadenopathy.   Heart:regular rate and rhythm.  S1 and S2.  Bilateral lower extremity edema noted with 1+ at the level of the ankle. Lung: Clear without any rhonchi or wheezes.  No dullness to percussion. Abdomin: Soft, nontender, nondistended with good bowel sounds.  No hepatosplenomegaly. Musculoskeletal: No joint deformity or effusion.  Full range of motion noted. Neurological: No deficits noted on motor, sensory and deep tendon reflex exam. Skin: No petechial rash or dryness.  Appeared moist.           Lab Results: Lab Results  Component Value Date   WBC 7.3 06/19/2020   HGB 11.1 (L) 06/19/2020   HCT 37.0 (L) 06/19/2020   MCV 112.5 (H) 06/19/2020   PLT 99 (L) 06/19/2020     Chemistry      Component Value Date/Time   NA 141 06/19/2020 0758   NA 142 12/24/2017 1317   K 4.1 06/19/2020 0758   K 4.3 12/24/2017 1317   CL 109 06/19/2020 0758   CL 106 05/24/2013 1436   CO2 25 06/19/2020 0758   CO2 32 (H) 12/24/2017 1317   BUN 16 06/19/2020 0758   BUN 10.3 12/24/2017 1317   CREATININE 1.31 (H) 06/19/2020 0758   CREATININE 0.9 12/24/2017 1317   GLU 160 (H) 06/29/2015 0957       Component Value Date/Time   CALCIUM 8.1 (L) 06/19/2020 0758   CALCIUM 8.8 12/24/2017 1317   ALKPHOS 73 06/19/2020 0758   ALKPHOS 95 12/24/2017 1317   AST 19 06/19/2020 0758   AST 20 12/24/2017 1317   ALT 20 06/19/2020 0758   ALT 25 12/24/2017 1317   BILITOT 0.3 06/19/2020 0758   BILITOT 0.25 12/24/2017 1317      IMPRESSION: 1. Increased size of the left hilar node versus perihilar mass, currently 3.9 by 3.5 cm. 2. Small foci of arterial phase enhancement in the liver, increased size/conspicuity compared to prior exams. Although benign vascular malformations are possible, this appearance of  increasing conspicuity raises concern for hypervascular metastatic lesions. 3. Minimal increase in prominence of the metastatic lesions in the pancreatic body. 4. Stable lytic/lytic lesions in the right proximal femur, right sacrum, and right inferior pubic ramus. 5. Small amount of pelvic ascites. Mild mesenteric edema. 6. Ascending aortic aneurysm 4.0 cm in diameter. Low-density blood pool on precontrast images, suggesting anemia. 7. Small left pleural effusion, increased from prior. Trace right pleural effusion. 8. Gallbladder wall thickening could be from hypoproteinemia/hypoalbuminemia or inflammation. 9. Emphysema and aortic atherosclerosis.   Impression and Plan:   54 year old man with  1.    Kidney cancer diagnosed in 2009.  He was found to have stage IV clear-cell histology with pulmonary involvement.  He status post therapy outlined above and currently on Cabometyx.  CT scan obtained on 06/19/2020 was personally reviewed and showed no major changes in his overall disease volume.  Slight increase in size of his pulmonary and pancreatic lesions but do not warrant change of treatment at this time.  The natural course of this disease as well as future treatment options were discussed.  He has multiple therapies in the past although he has not received combination  immunotherapy with ipilimumab and nivolumab which would be an option for salvage therapy.  At this time I recommended continuing Cabometyx and repeat imaging studies in 6 months.  2.  Pulmonary embolism: He is currently on Xarelto without any recent thrombosis of bleeding.  3.  Anemia: Multifactorial in nature related to blood loss as well as chronic disease and malignancy.  His hemoglobin is improving currently at 11.1.  Iron studies as well as B12 levels from May 2021 showed no deficiency.   4.  Diarrhea: Manageable with the current medication.  His diarrhea is related to Cabometyx.  5.  Anxiety and depression: Manageable at this time on Xanax as needed.  6.  Prognosis: Therapy remains palliative although his performance status is excellent aggressive measures are warranted at this time given his overall stable disease.  7.  Lower extremity edema: Prescription for Lasix will be available for him to use as needed.  I emphasized the importance of elevating his feet active.   8.  Follow-up: He will return in 3 months for follow-up evaluation.   30  minutes were dedicated to this visit.  The time was spent on reviewing his disease status, discussing treatment options and future plan of care review.   Zola Button, MD 06/20/2020 12:12 PM

## 2020-06-21 ENCOUNTER — Other Ambulatory Visit: Payer: Self-pay | Admitting: Oncology

## 2020-06-30 ENCOUNTER — Encounter (HOSPITAL_COMMUNITY): Payer: Self-pay | Admitting: Emergency Medicine

## 2020-06-30 ENCOUNTER — Emergency Department (HOSPITAL_COMMUNITY): Payer: Medicare Other

## 2020-06-30 ENCOUNTER — Other Ambulatory Visit: Payer: Self-pay

## 2020-06-30 ENCOUNTER — Emergency Department (HOSPITAL_COMMUNITY)
Admission: EM | Admit: 2020-06-30 | Discharge: 2020-06-30 | Disposition: A | Discharge status: Home / Self Care | Payer: Medicare Other | Attending: Emergency Medicine | Admitting: Emergency Medicine

## 2020-06-30 DIAGNOSIS — F17210 Nicotine dependence, cigarettes, uncomplicated: Secondary | ICD-10-CM | POA: Diagnosis not present

## 2020-06-30 DIAGNOSIS — E119 Type 2 diabetes mellitus without complications: Secondary | ICD-10-CM | POA: Diagnosis not present

## 2020-06-30 DIAGNOSIS — R05 Cough: Secondary | ICD-10-CM | POA: Diagnosis not present

## 2020-06-30 DIAGNOSIS — R059 Cough, unspecified: Secondary | ICD-10-CM

## 2020-06-30 DIAGNOSIS — K920 Hematemesis: Secondary | ICD-10-CM | POA: Diagnosis present

## 2020-06-30 DIAGNOSIS — Z79899 Other long term (current) drug therapy: Secondary | ICD-10-CM | POA: Diagnosis not present

## 2020-06-30 DIAGNOSIS — R042 Hemoptysis: Secondary | ICD-10-CM | POA: Diagnosis not present

## 2020-06-30 LAB — BASIC METABOLIC PANEL
Anion gap: 8 (ref 5–15)
BUN: 14 mg/dL (ref 6–20)
CO2: 28 mmol/L (ref 22–32)
Calcium: 8.1 mg/dL — ABNORMAL LOW (ref 8.9–10.3)
Chloride: 106 mmol/L (ref 98–111)
Creatinine, Ser: 1.41 mg/dL — ABNORMAL HIGH (ref 0.61–1.24)
GFR calc Af Amer: >60 mL/min (ref >60)
GFR calc non Af Amer: 56 mL/min — ABNORMAL LOW (ref >60)
Glucose, Bld: 87 mg/dL (ref 70–99)
Potassium: 4 mmol/L (ref 3.5–5.1)
Sodium: 142 mmol/L (ref 135–145)

## 2020-06-30 LAB — CBC WITH DIFFERENTIAL/PLATELET
Abs Immature Granulocytes: 0.01 10*3/uL (ref 0.00–0.07)
Basophils Absolute: 0 10*3/uL (ref 0.0–0.1)
Basophils Relative: 1 %
Eosinophils Absolute: 0.2 10*3/uL (ref 0.0–0.5)
Eosinophils Relative: 3 %
HCT: 32.5 % — ABNORMAL LOW (ref 39.0–52.0)
Hemoglobin: 9.5 g/dL — ABNORMAL LOW (ref 13.0–17.0)
Immature Granulocytes: 0 %
Lymphocytes Relative: 28 %
Lymphs Abs: 1.3 10*3/uL (ref 0.7–4.0)
MCH: 32.8 pg (ref 26.0–34.0)
MCHC: 29.2 g/dL — ABNORMAL LOW (ref 30.0–36.0)
MCV: 112.1 fL — ABNORMAL HIGH (ref 80.0–100.0)
Monocytes Absolute: 0.3 10*3/uL (ref 0.1–1.0)
Monocytes Relative: 6 %
Neutro Abs: 3 10*3/uL (ref 1.7–7.7)
Neutrophils Relative %: 62 %
Platelets: 117 10*3/uL — ABNORMAL LOW (ref 150–400)
RBC: 2.9 MIL/uL — ABNORMAL LOW (ref 4.22–5.81)
RDW: 15.4 % (ref 11.5–15.5)
WBC: 4.9 10*3/uL (ref 4.0–10.5)
nRBC: 0 % (ref 0.0–0.2)

## 2020-06-30 LAB — TYPE AND SCREEN
ABO/RH(D): A NEG
Antibody Screen: NEGATIVE

## 2020-06-30 NOTE — Discharge Instructions (Addendum)
Follow-up in the cancer center on Monday

## 2020-06-30 NOTE — ED Provider Notes (Signed)
[facility] DEPT Provider Note   CSN: [id number] Arrival date & time: 06/30/20  1458     History Chief Complaint  Patient presents with  . Hematemesis    Keith [name] is a 54 y.o. male.  54 year old male presents with hemoptysis since yesterday.  Patient is on Xarelto and does have a history of metastatic renal cell cancer.  He denies any fever or chills.  States he has not been dyspneic.  Denies any weakness.  Is on Xarelto for history of PE.  No new leg pain or swelling.  No treatment use prior to arrival        Past Medical History:  Diagnosis Date  . Diabetes mellitus without complication ([place])    diet controlled only  . GI bleed   . left renal ca d'd 2008  . Malignant neoplasm metastatic to pancreas ([place]) 2012  . 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 Metastasis to bone [facility]) 2014/2016    Patient Active Problem List   Diagnosis Date Noted  . Occult GI bleeding   . Acute GI bleeding 02/29/2020  . Acute blood loss anemia   . Syncope 02/28/2020  . Orthostatic hypotension 02/28/2020  . UGIB (upper gastrointestinal bleed) 08/28/2019  . Acute upper GI bleed 08/28/2019  . Acute esophagitis   . Gastritis and gastroduodenitis   . Hematochezia   . Metastasis from malignant tumor of kidney ([place]) 02/15/2018  . Symptomatic anemia   . Pulmonary embolus, left ([place])   . Depression 11/13/2015  . Benign essential HTN 11/13/2015  . Malnutrition of moderate degree 11/12/2015  . HCAP (healthcare-associated pneumonia) 11/11/2015  . Chest pain 11/11/2015  . Acute pulmonary embolism ([place]) 11/11/2015  . Metastasis to lung ([place]) 11/11/2015  . Right middle lobe, bilateral lower lobes pneumonia  11/11/2015  . Acute respiratory failure with hypoxia ([place]) 11/11/2015  . Chemotherapy induced thrombocytopenia 11/11/2015  . Antineoplastic chemotherapy induced anemia 11/11/2015  . Hyponatremia 11/11/2015  . Cancer related pain 11/11/2015  . Transaminitis 11/11/2015  .  Diabetes mellitus with diabetic neuropathy, with long-term current use of insulin ([place]) 11/11/2015  . Therapeutic opioid-induced constipation ([facility]) 11/11/2015  . Bone metastasis ([place]) 12/25/2014  . Polysubstance abuse-cocaine and THC. 04/25/2013  . Renal cancer ([place]) 08/12/2011    Past Surgical History:  Procedure Laterality Date  . BIOPSY  08/28/2019   Procedure: BIOPSY;  Surgeon: Thornton Park, MD;  Location: [facility];  Service: Gastroenterology;;  . BIOPSY  03/01/2020   Procedure: BIOPSY;  Surgeon: Mauri Pole, MD;  Location: [facility];  Service: Endoscopy;;  . COLONOSCOPY W/ POLYPECTOMY    . COLONOSCOPY WITH PROPOFOL N/A 03/01/2020   Procedure: COLONOSCOPY WITH PROPOFOL;  Surgeon: Mauri Pole, MD;  Location: [facility];  Service: Endoscopy;  Laterality: N/A;  . ESOPHAGOGASTRODUODENOSCOPY (EGD) WITH PROPOFOL N/A 08/28/2019   Procedure: ESOPHAGOGASTRODUODENOSCOPY (EGD) WITH PROPOFOL;  Surgeon: Thornton Park, MD;  Location: [facility];  Service: Gastroenterology;  Laterality: N/A;  . ESOPHAGOGASTRODUODENOSCOPY (EGD) WITH PROPOFOL N/A 03/01/2020   Procedure: ESOPHAGOGASTRODUODENOSCOPY (EGD) WITH PROPOFOL;  Surgeon: Mauri Pole, MD;  Location: [facility];  Service: Endoscopy;  Laterality: N/A;  . FEMUR IM NAIL Right 02/16/2018   Procedure: INTRAMEDULLARY (IM) NAIL RIGHT FEMORAL;  Surgeon: Paralee Cancel, MD;  Location: [facility];  Service: Orthopedics;  Laterality: Right;  . GIVENS CAPSULE STUDY N/A 03/03/2020   Procedure: GIVENS CAPSULE STUDY;  Surgeon: Mauri Pole, MD;  Location: [facility];  Service: Endoscopy;  Laterality: N/A;  . HAND  SURGERY Right   . IR ANGIOGRAM EXTREMITY RIGHT  02/15/2018  . IR ANGIOGRAM SELECTIVE EACH ADDITIONAL VESSEL  02/15/2018  . IR ANGIOGRAM SELECTIVE EACH ADDITIONAL VESSEL  02/15/2018  . IR ANGIOGRAM SELECTIVE EACH ADDITIONAL VESSEL  02/15/2018  . IR ANGIOGRAM SELECTIVE EACH ADDITIONAL VESSEL  02/15/2018  . IR EMBO  TUMOR ORGAN ISCHEMIA INFARCT INC GUIDE ROADMAPPING  02/15/2018  . IR EMBO TUMOR ORGAN ISCHEMIA INFARCT INC GUIDE ROADMAPPING  02/15/2018  . IR US GUIDE VASC ACCESS LEFT  02/15/2018  . LUNG REMOVAL, PARTIAL Right 09/2008  . NEPHRECTOMY RADICAL     Left   . PAIN PUMP IMPLANTATION N/A 05/16/2015   Procedure: Intrathecal pain pump placement;  Surgeon: Clydell Hakim, MD;  Location: [facility];  Service: Neurosurgery;  Laterality: N/A;  Intrathecal pain pump placement       Family History  Problem Relation Age of Onset  . Hypertension Mother   . Diabetes Brother   . Irritable bowel syndrome Sister   . Cancer Maternal Aunt        ovarian  . Colon cancer Neg Hx     Social History   Tobacco Use  . Smoking status: Current Every Day Smoker    Packs/day: 1.00    Years: 30.00    Pack years: 30.00    Types: Cigarettes  . Smokeless tobacco: Never Used  Substance Use Topics  . Alcohol use: No  . Drug use: Yes    Types: Marijuana    Comment: occasional. Last used: last night.     Home Medications Prior to Admission medications   Medication Sig Start Date End Date Taking? Authorizing Provider  ALPRAZolam (XANAX) 1 MG tablet TAKE 1 TABLET BY MOUTH THREE TIMES A DAY AS NEEDED FOR ANXIETY 06/21/20   Shadad, Mathis Dad, MD  CABOMETYX 60 MG tablet TAKE 1 TABLET (60 MG) BY  MOUTH ONE TIME DAILY WITH  AT LEAST 8 OZ OF WATER ON  AN EMPTY STOMACH. DO NOT  EAT FOR 2 HOURS BEFORE OR 1 HOUR A 04/01/20   [name], MD  calcium carbonate (TUMS - DOSED IN MG ELEMENTAL CALCIUM) 500 MG chewable tablet Chew 1 tablet by mouth 3 (three) times daily as needed for indigestion or heartburn.     [provider]  DULoxetine (CYMBALTA) 60 MG capsule TAKE 1 CAPSULE (60 MG TOTAL) BY MOUTH 2 (TWO) TIMES DAILY. 12/26/19   [name], MD  ferrous sulfate 325 (65 FE) MG tablet Take 1 tablet (325 mg total) by mouth daily with breakfast. 05/02/20 07/01/20  Zehr, Janett Billow D, PA-C  gabapentin (NEURONTIN) 300 MG  capsule TAKE 1 CAPSULE BY MOUTH THREE TIMES A DAY 05/10/20   [name], MD  HYDROmorphone (DILAUDID) 8 MG tablet Take 1 tablet (8 mg total) by mouth every 4 (four) hours as needed for severe pain. 02/17/18   Danae Orleans, PA-C  hydroxypropyl methylcellulose / hypromellose (ISOPTO TEARS / GONIOVISC) 2.5 % ophthalmic solution Place 1 drop into both eyes 3 (three) times daily as needed for dry eyes.    [provider]  loratadine (CLARITIN) 10 MG tablet Take 10 mg by mouth daily as needed for allergies.     [provider]  PAIN MANAGEMENT INTRATHECAL, IT, PUMP 1 each by Intrathecal route continuous. Intrathecal (IT) medication:   Morphine.    [provider]  pantoprazole (PROTONIX) 40 MG tablet Take 1 tablet (40 mg total) by mouth 2 (two) times daily. 03/27/20 03/27/21  Zehr,  Laban Emperor, PA-C  sucralfate (CARAFATE) 1 GM/10ML suspension Take 10 mLs (1 g total) by mouth every 6 (six) hours. 08/31/19 09/30/19  Amin, Jeanella Flattery, MD  tiZANidine (ZANAFLEX) 4 MG tablet Take 1 tablet (4 mg total) by mouth every 8 (eight) hours as needed for muscle spasms. 02/17/18   Danae Orleans, PA-C  XARELTO 20 MG TABS tablet TAKE 1 TABLET (20 MG TOTAL) BY MOUTH DAILY WITH SUPPER. 05/14/20   [name], MD    Allergies    Ceftriaxone and Hydrocodone  Review of Systems   Review of Systems  All other systems reviewed and are negative.   Physical Exam Updated Vital Signs BP (!) 175/102 (BP Location: Right Arm)   Pulse 80   Temp 97.9 F (36.6 C) (Oral)   Resp 17   SpO2 100%   Physical Exam Vitals and nursing note reviewed.  Constitutional:      General: He is not in acute distress.    Appearance: Normal appearance. He is well-developed. He is not toxic-appearing.  HENT:     Head: Normocephalic and atraumatic.  Eyes:     General: Lids are normal.     Conjunctiva/sclera: Conjunctivae normal.     Pupils: Pupils are equal, round, and reactive to light.  Neck:     Thyroid: No  thyroid mass.     Trachea: No tracheal deviation.  Cardiovascular:     Rate and Rhythm: Normal rate and regular rhythm.     Heart sounds: Normal heart sounds. No murmur heard.  No gallop.   Pulmonary:     Effort: Pulmonary effort is normal. No respiratory distress.     Breath sounds: Normal breath sounds. No stridor. No decreased breath sounds, wheezing, rhonchi or rales.  Abdominal:     General: Bowel sounds are normal. There is no distension.     Palpations: Abdomen is soft.     Tenderness: There is no abdominal tenderness. There is no rebound.  Musculoskeletal:        General: No tenderness. Normal range of motion.     Cervical back: Normal range of motion and neck supple.  Skin:    General: Skin is warm and dry.     Findings: No abrasion or rash.  Neurological:     Mental Status: He is alert and oriented to person, place, and time.     GCS: GCS eye subscore is 4. GCS verbal subscore is 5. GCS motor subscore is 6.     Cranial Nerves: No cranial nerve deficit.     Sensory: No sensory deficit.  Psychiatric:        Speech: Speech normal.        Behavior: Behavior normal.     ED Results / Procedures / Treatments   Labs (all labs ordered are listed, but only abnormal results are displayed) Labs Reviewed  CBC WITH DIFFERENTIAL/PLATELET  BASIC METABOLIC PANEL  TYPE AND SCREEN    EKG None  Radiology No results found.  Procedures Procedures (including critical care time)  Medications Ordered in ED Medications - No data to display  ED Course  I have reviewed the triage vital signs and the nursing notes.  Pertinent labs & imaging results that were available during my care of the patient were reviewed by me and considered in my medical decision making (see chart for details).    MDM Rules/Calculators/A&P  Chest x-ray and blood work are without significant finding.  Case discussed with Dr. Alvy Bimler who recommends that patient follow-up in the  clinic.  Return precautions given Final Clinical Impression(s) / ED Diagnoses Final diagnoses:  Cough    Rx / DC Orders ED Discharge Orders    None       Lacretia Leigh, MD 06/30/20 2055

## 2020-06-30 NOTE — ED Notes (Signed)
Attempted IV x 2 w/o success. Obtained blood and sent. Will get second RN to attempt IV.

## 2020-06-30 NOTE — ED Triage Notes (Signed)
Pt reports takes Xeralto and coughing up blood since yesterday. Pt states that having chest pains when he coughs.

## 2020-07-02 ENCOUNTER — Telehealth: Payer: Self-pay | Admitting: *Deleted

## 2020-07-02 NOTE — Telephone Encounter (Signed)
Called pt & informed to stop xarelto for now per [name] & give [place] an update toward end of week regarding bleeding.

## 2020-07-02 NOTE — Telephone Encounter (Signed)
Please advise him to stop Xarelto for now. I anticipate improvement after that.

## 2020-07-02 NOTE — Telephone Encounter (Signed)
Received call from pt stating that he spent Saturday in the ED b/c he was coughing up some blood.  He reported blood clots.  He states that he was told that this was normal b/c he is on the xarelto.  He states that he is scared to death.  He reports a little blood on Sunday & once this am small amt.  He is on Cabometyx.  Message to [name] for review.

## 2020-07-09 ENCOUNTER — Telehealth: Payer: Self-pay | Admitting: *Deleted

## 2020-07-09 NOTE — Telephone Encounter (Signed)
Please let him know to resume.

## 2020-07-09 NOTE — Telephone Encounter (Signed)
Contacted patient with Dr. Hazeline Junker instructions to resume Xarelto as per response below. Patient verbalized understanding.

## 2020-07-09 NOTE — Telephone Encounter (Signed)
Patient called - still off Xarelto as advised on 7/10. No further episodes of spitting up or coughing up blood. Wants to know if he should resume it. Call routed to Dr. [name].

## 2020-07-23 ENCOUNTER — Telehealth: Payer: Self-pay | Admitting: Gastroenterology

## 2020-07-23 NOTE — Telephone Encounter (Signed)
Patients wife called states he is not well keeps throwing up like yellow stuff is seeking advise

## 2020-07-23 NOTE — Telephone Encounter (Signed)
The pt called and states that he has been vomiting "yellow stuff" and blood.  He also states he has had some blood in the stool.  He was advised to go to the ED for eval.  He agreed and will have his wife take him.  Jess FYI

## 2020-07-26 ENCOUNTER — Other Ambulatory Visit: Payer: Self-pay | Admitting: Oncology

## 2020-07-26 MED ORDER — ALPRAZOLAM 1 MG PO TABS: ORAL_TABLET | ORAL | 0 refills | Status: DC

## 2020-08-01 NOTE — Telephone Encounter (Signed)
Would you mind following up with them?  I do not see that he ever went to the ER.  Thank you,  Jess

## 2020-08-02 NOTE — Telephone Encounter (Signed)
Great!  Thank you for checking!

## 2020-08-02 NOTE — Telephone Encounter (Signed)
I spoke with the pt and he states he did not go to the ED because he feels better and has only had one episode of vomiting since.  He is due for labs and they have been entered and he will come in at his earliest convenience.  Also, we went ahead and got him on Dr Ardis Hughs schedule.

## 2020-08-14 DIAGNOSIS — G894 Chronic pain syndrome: Secondary | ICD-10-CM | POA: Diagnosis not present

## 2020-08-16 ENCOUNTER — Other Ambulatory Visit (INDEPENDENT_AMBULATORY_CARE_PROVIDER_SITE_OTHER): Payer: Medicare Other

## 2020-08-16 DIAGNOSIS — K922 Gastrointestinal hemorrhage, unspecified: Secondary | ICD-10-CM

## 2020-08-16 DIAGNOSIS — D62 Acute posthemorrhagic anemia: Secondary | ICD-10-CM

## 2020-08-16 DIAGNOSIS — G620 Drug-induced polyneuropathy: Secondary | ICD-10-CM | POA: Diagnosis not present

## 2020-08-16 DIAGNOSIS — Z9689 Presence of other specified functional implants: Secondary | ICD-10-CM | POA: Diagnosis not present

## 2020-08-16 DIAGNOSIS — C642 Malignant neoplasm of left kidney, except renal pelvis: Secondary | ICD-10-CM | POA: Diagnosis not present

## 2020-08-16 LAB — CBC WITH DIFFERENTIAL/PLATELET
Basophils Absolute: 0 10*3/uL (ref 0.0–0.1)
Basophils Relative: 0.8 % (ref 0.0–3.0)
Eosinophils Absolute: 0.1 10*3/uL (ref 0.0–0.7)
Eosinophils Relative: 2.9 % (ref 0.0–5.0)
HCT: 30.1 % — ABNORMAL LOW (ref 39.0–52.0)
Hemoglobin: 9.6 g/dL — ABNORMAL LOW (ref 13.0–17.0)
Lymphocytes Relative: 17.5 % (ref 12.0–46.0)
Lymphs Abs: 0.7 10*3/uL (ref 0.7–4.0)
MCHC: 32.1 g/dL (ref 30.0–36.0)
MCV: 104.9 fl — ABNORMAL HIGH (ref 78.0–100.0)
Monocytes Absolute: 0.3 10*3/uL (ref 0.1–1.0)
Monocytes Relative: 6 % (ref 3.0–12.0)
Neutro Abs: 3.1 10*3/uL (ref 1.4–7.7)
Neutrophils Relative %: 72.8 % (ref 43.0–77.0)
Platelets: 118 10*3/uL — ABNORMAL LOW (ref 150.0–400.0)
RBC: 2.87 Mil/uL — ABNORMAL LOW (ref 4.22–5.81)
RDW: 16.9 % — ABNORMAL HIGH (ref 11.5–15.5)
WBC: 4.3 10*3/uL (ref 4.0–10.5)

## 2020-08-16 LAB — VITAMIN B12: Vitamin B-12: 163 pg/mL — ABNORMAL LOW (ref 211–911)

## 2020-08-16 LAB — FOLATE: Folate: 7.7 ng/mL (ref >5.9)

## 2020-08-23 NOTE — Telephone Encounter (Signed)
error 

## 2020-09-04 ENCOUNTER — Other Ambulatory Visit: Payer: Self-pay | Admitting: Oncology

## 2020-09-04 MED ORDER — ALPRAZOLAM 1 MG PO TABS: ORAL_TABLET | ORAL | 0 refills | Status: DC

## 2020-09-11 ENCOUNTER — Telehealth: Payer: Self-pay

## 2020-09-11 NOTE — Telephone Encounter (Signed)
Called patient per fax from [facility] to have medication delivery set up. Patient states that his wife talked to them yesterday and has set up medication delivery.

## 2020-09-21 ENCOUNTER — Inpatient Hospital Stay: Payer: Medicare Other | Attending: Oncology

## 2020-09-21 ENCOUNTER — Inpatient Hospital Stay (HOSPITAL_BASED_OUTPATIENT_CLINIC_OR_DEPARTMENT_OTHER): Payer: Medicare Other | Admitting: Oncology

## 2020-09-21 ENCOUNTER — Other Ambulatory Visit: Payer: Self-pay

## 2020-09-21 VITALS — BP 162/94 | HR 77 | Temp 97.7°F | Resp 17 | Ht 75.0 in | Wt 162.9 lb

## 2020-09-21 DIAGNOSIS — F419 Anxiety disorder, unspecified: Secondary | ICD-10-CM | POA: Diagnosis not present

## 2020-09-21 DIAGNOSIS — C649 Malignant neoplasm of unspecified kidney, except renal pelvis: Secondary | ICD-10-CM

## 2020-09-21 DIAGNOSIS — Z79899 Other long term (current) drug therapy: Secondary | ICD-10-CM | POA: Insufficient documentation

## 2020-09-21 DIAGNOSIS — F329 Major depressive disorder, single episode, unspecified: Secondary | ICD-10-CM | POA: Insufficient documentation

## 2020-09-21 DIAGNOSIS — Z9221 Personal history of antineoplastic chemotherapy: Secondary | ICD-10-CM | POA: Diagnosis not present

## 2020-09-21 DIAGNOSIS — R6881 Early satiety: Secondary | ICD-10-CM | POA: Diagnosis not present

## 2020-09-21 DIAGNOSIS — Z905 Acquired absence of kidney: Secondary | ICD-10-CM | POA: Diagnosis not present

## 2020-09-21 DIAGNOSIS — R634 Abnormal weight loss: Secondary | ICD-10-CM | POA: Insufficient documentation

## 2020-09-21 DIAGNOSIS — Z923 Personal history of irradiation: Secondary | ICD-10-CM | POA: Insufficient documentation

## 2020-09-21 DIAGNOSIS — C7800 Secondary malignant neoplasm of unspecified lung: Secondary | ICD-10-CM | POA: Insufficient documentation

## 2020-09-21 DIAGNOSIS — Z7901 Long term (current) use of anticoagulants: Secondary | ICD-10-CM | POA: Insufficient documentation

## 2020-09-21 DIAGNOSIS — R042 Hemoptysis: Secondary | ICD-10-CM | POA: Diagnosis not present

## 2020-09-21 DIAGNOSIS — I2699 Other pulmonary embolism without acute cor pulmonale: Secondary | ICD-10-CM | POA: Diagnosis not present

## 2020-09-21 DIAGNOSIS — C7951 Secondary malignant neoplasm of bone: Secondary | ICD-10-CM | POA: Insufficient documentation

## 2020-09-21 DIAGNOSIS — D649 Anemia, unspecified: Secondary | ICD-10-CM | POA: Diagnosis not present

## 2020-09-21 LAB — CMP (CANCER CENTER ONLY)
ALT: 14 U/L (ref 0–44)
AST: 15 U/L (ref 15–41)
Albumin: 2.3 g/dL — ABNORMAL LOW (ref 3.5–5.0)
Alkaline Phosphatase: 76 U/L (ref 38–126)
Anion gap: 3 — ABNORMAL LOW (ref 5–15)
BUN: 18 mg/dL (ref 6–20)
CO2: 23 mmol/L (ref 22–32)
Calcium: 7.8 mg/dL — ABNORMAL LOW (ref 8.9–10.3)
Chloride: 112 mmol/L — ABNORMAL HIGH (ref 98–111)
Creatinine: 1.45 mg/dL — ABNORMAL HIGH (ref 0.61–1.24)
GFR, Est AFR Am: >60 mL/min (ref >60)
GFR, Estimated: 55 mL/min — ABNORMAL LOW (ref >60)
Glucose, Bld: 106 mg/dL — ABNORMAL HIGH (ref 70–99)
Potassium: 4.6 mmol/L (ref 3.5–5.1)
Sodium: 138 mmol/L (ref 135–145)
Total Bilirubin: <0.2 mg/dL — ABNORMAL LOW (ref 0.3–1.2)
Total Protein: 5.8 g/dL — ABNORMAL LOW (ref 6.5–8.1)

## 2020-09-21 LAB — CBC WITH DIFFERENTIAL (CANCER CENTER ONLY)
Abs Immature Granulocytes: 0.01 10*3/uL (ref 0.00–0.07)
Basophils Absolute: 0 10*3/uL (ref 0.0–0.1)
Basophils Relative: 0 %
Eosinophils Absolute: 0.2 10*3/uL (ref 0.0–0.5)
Eosinophils Relative: 4 %
HCT: 30.3 % — ABNORMAL LOW (ref 39.0–52.0)
Hemoglobin: 9 g/dL — ABNORMAL LOW (ref 13.0–17.0)
Immature Granulocytes: 0 %
Lymphocytes Relative: 17 %
Lymphs Abs: 1 10*3/uL (ref 0.7–4.0)
MCH: 33.1 pg (ref 26.0–34.0)
MCHC: 29.7 g/dL — ABNORMAL LOW (ref 30.0–36.0)
MCV: 111.4 fL — ABNORMAL HIGH (ref 80.0–100.0)
Monocytes Absolute: 0.3 10*3/uL (ref 0.1–1.0)
Monocytes Relative: 6 %
Neutro Abs: 4 10*3/uL (ref 1.7–7.7)
Neutrophils Relative %: 73 %
Platelet Count: 112 10*3/uL — ABNORMAL LOW (ref 150–400)
RBC: 2.72 MIL/uL — ABNORMAL LOW (ref 4.22–5.81)
RDW: 16.6 % — ABNORMAL HIGH (ref 11.5–15.5)
WBC Count: 5.6 10*3/uL (ref 4.0–10.5)
nRBC: 0 % (ref 0.0–0.2)

## 2020-09-21 NOTE — Progress Notes (Signed)
Hematology and Oncology Follow Up   Keith [name] [id number] [birth year] 54 y.o. 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 He has clear-cell histology.   Prior Therapy: 1. Status post laparoscopic radical nephrectomy.  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  2. Patient status post thoracotomy for a synchronous metastatic lung lesions done October 2009.   3. Patient is status post stereotactic radiotherapy to pulmonary nodules in May of 2010. 4. He is S/P Sutent 50 mg 4 weeks on 2 weeks off from 10/2010 to 03/2013. He progressed at that time.  5. He is S/P radiation to the right sacral bone between 4/22 to 4/30.  6. He is S/P XRT to the left shoulder 03/20/14 to 03/31/14. 7. Votrient 800 mg by mouth daily from 03/2013 through 06/22/2015. Discontinued secondary to disease progression. 8. Nivolumab 3 mg/kg given every 2 weeks started on 06/29/2015. He is status post 4 cycles completed 08/10/2015. He developed disease progression in September 2016.  9. Status post radiation therapy to the left mid fibula completed on 11/14/2015. He received a grade 1 fraction. 10. He is status post radiation to the proximal and distal femur over 2 weeks and 10 fractions of total of 30 Gy.  This was completed in March 2019. 11.   Radiation therapy to the right tibia.  Last treatment completed in September 2019.   Current therapy: Cabometyx 60 mg daily started in November 2016.   Interim History: Keith [name] is here for repeat follow-up. Since the last visit, he has reported to symptoms of hemoptysis periodically. He was seen in the emergency department on July 10 and Xarelto was withheld. Symptoms has improved and Xarelto was resumed at this time. He continues to have stomach discomfort and early satiety. He has follow-up with GI in the near future for  possible endoscopy.  He continues to tolerate Cabometyx without any recent complaints. He denied any worsening diarrhea or excessive fatigue. He has lost weight but his overall performance status quality of life remains manageable.    Medications: Reviewed without changes. Current Outpatient Medications  Medication Sig Dispense Refill  . ALPRAZolam (XANAX) 1 MG tablet TAKE 1 TABLET BY MOUTH THREE TIMES A DAY AS NEEDED FOR ANXIETY 90 tablet 0  . CABOMETYX 60 MG tablet TAKE 1 TABLET (60 MG) BY  MOUTH ONE TIME DAILY WITH  AT LEAST 8 OZ OF WATER ON  AN EMPTY STOMACH. DO NOT  EAT FOR 2 HOURS BEFORE OR 1 HOUR A (Patient taking differently: Take 60 mg by mouth daily. ) 30 tablet 11  . calcium carbonate (TUMS - DOSED IN MG ELEMENTAL CALCIUM) 500 MG chewable tablet Chew 1 tablet by mouth 3 (three) times daily as needed for indigestion or heartburn.     . DULoxetine (CYMBALTA) 60 MG capsule TAKE 1 CAPSULE (60 MG TOTAL) BY MOUTH 2 (TWO) TIMES DAILY. 180 capsule 0  . ferrous sulfate 325 (65 FE) MG tablet Take 1 tablet (325 mg total) by mouth daily with breakfast. 60 tablet 3  . gabapentin (NEURONTIN) 300 MG capsule TAKE 1 CAPSULE BY MOUTH THREE TIMES A DAY (Patient taking differently: Take 300 mg by mouth 3 (three) times daily. ) 270 capsule 0  . HYDROmorphone (DILAUDID) 8 MG tablet Take 1 tablet (8 mg total) by mouth every 4 (four) hours as needed for  severe pain. (Patient taking differently: Take 8 mg by mouth every 4 (four) hours as needed for moderate pain or severe pain. ) 30 tablet 0  . hydroxypropyl methylcellulose / hypromellose (ISOPTO TEARS / GONIOVISC) 2.5 % ophthalmic solution Place 1 drop into both eyes 3 (three) times daily as needed for dry eyes.    [name] loratadine (CLARITIN) 10 MG tablet Take 10 mg by mouth daily as needed for allergies.     [name] ondansetron (ZOFRAN-ODT) 8 MG disintegrating tablet Take 8 mg by mouth daily as needed for nausea or vomiting.     [name] PAIN MANAGEMENT INTRATHECAL, IT, PUMP  1 each by Intrathecal route continuous. Intrathecal (IT) medication:   Morphine.    . pantoprazole (PROTONIX) 40 MG tablet Take 1 tablet (40 mg total) by mouth 2 (two) times daily. 30 tablet 11  . sucralfate (CARAFATE) 1 GM/10ML suspension Take 10 mLs (1 g total) by mouth every 6 (six) hours. (Patient not taking: Reported on 06/30/2020) 1200 mL 0  . tiZANidine (ZANAFLEX) 4 MG tablet Take 1 tablet (4 mg total) by mouth every 8 (eight) hours as needed for muscle spasms. 30 tablet 0  . XARELTO 20 MG TABS tablet TAKE 1 TABLET (20 MG TOTAL) BY MOUTH DAILY WITH SUPPER. (Patient taking differently: Take 20 mg by mouth daily. ) 90 tablet 3   No current facility-administered medications for this visit.     Allergies:  Allergies  Allergen Reactions  . Ceftriaxone Hives  . Hydrocodone Swelling    Physical examination:  Blood pressure (!) 162/94, pulse 77, temperature 97.7 F (36.5 C), temperature source Tympanic, resp. rate 17, height 6\' 3"  (1.905 m), weight 162 lb 14.4 oz (73.9 kg), SpO2 100 %.   ECOG 1    General appearance: Alert, awake without any distress. Head: Atraumatic without abnormalities Oropharynx: Without any thrush or ulcers. Eyes: No scleral icterus. Lymph nodes: No lymphadenopathy noted in the cervical, supraclavicular, or axillary nodes Heart:regular rate and rhythm, without any murmurs or gallops.   Lung: Clear to auscultation without any rhonchi, wheezes or dullness to percussion. Abdomin: Soft, nontender without any shifting dullness or ascites. Musculoskeletal: No clubbing or cyanosis. Neurological: No motor or sensory deficits. Skin: No rashes or lesions.           Lab Results: Lab Results  Component Value Date   WBC 5.6 09/21/2020   HGB 9.0 (L) 09/21/2020   HCT 30.3 (L) 09/21/2020   MCV 111.4 (H) 09/21/2020   PLT 112 (L) 09/21/2020     Chemistry      Component Value Date/Time   NA 142 06/30/2020 1709   NA 142 12/24/2017 1317   K 4.0 06/30/2020  1709   K 4.3 12/24/2017 1317   CL 106 06/30/2020 1709   CL 106 05/24/2013 1436   CO2 28 06/30/2020 1709   CO2 32 (H) 12/24/2017 1317   BUN 14 06/30/2020 1709   BUN 10.3 12/24/2017 1317   CREATININE 1.41 (H) 06/30/2020 1709   CREATININE 1.31 (H) 06/19/2020 0758   CREATININE 0.9 12/24/2017 1317   GLU 160 (H) 06/29/2015 0957      Component Value Date/Time   CALCIUM 8.1 (L) 06/30/2020 1709   CALCIUM 8.8 12/24/2017 1317   ALKPHOS 73 06/19/2020 0758   ALKPHOS 95 12/24/2017 1317   AST 19 06/19/2020 0758   AST 20 12/24/2017 1317   ALT 20 06/19/2020 0758   ALT 25 12/24/2017 1317   BILITOT 0.3 06/19/2020 0758   BILITOT 0.25 12/24/2017  34        Impression and Plan:   54 year old man with  1.   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   The natural course of his disease was reviewed and treatment options were discussed at this time. He has been on Cabometyx with disease stability for 5 years at this time. Plan is to repeat imaging studies before the next visit and determine best course of action. Salvage therapy options including retreatment with immunotherapy versus oral targeted therapy or combination of those.   2.  Pulmonary embolism: Xarelto has been resumed at this time without any active bleeding.  3.  Anemia: Hemoglobin is stable at this time at 9.0. This multifactorial related to malignancy and possible GI blood losses.  4.   Early satiety and dyspepsia: He has follow-up with a GI and possible endoscopy in the near future.  5.  Anxiety and depression: Currently on Xanax with symptoms are manageable.  6.  Prognosis: His disease is incurable but has been palliated for an extended period of time. His performance status remained reasonable for aggressive measures.   7.  Follow-up: In 2 months for repeat follow-up and imaging studies.   30  minutes were spent on this encounter. The time was dedicated to reviewing his  disease status, discussing treatment options and future plan of care review.   Zola Button, MD 09/21/2020 9:21 AM

## 2020-09-27 IMAGING — CT CT CHEST W/ CM
2 of 13 series · 9 of 46 positions shown, 15 images · IV contrast (OMNIPAQUE)
Comparison: Multiple exams, including 07/07/2017

CLINICAL DATA: Metastatic renal cancer. Oral chemotherapy in
progress. Prior left nephrectomy and right lung surgery.

EXAM:
CT CHEST WITH CONTRAST
CT ABDOMEN AND PELVIS WITH AND WITHOUT CONTRAST
TECHNIQUE: Multidetector CT imaging of the chest was performed during
intravenous contrast administration. Multidetector CT imaging of the
abdomen and pelvis was performed following the standard protocol
before and during bolus administration of intravenous contrast.
CONTRAST:  100mL OMNIPAQUE IOHEXOL 300 MG/ML  SOLN

[Series 3: coronal pre · coronal · non-contrast · 0.57mm/px · 2 of 95 slices shown, 3 images]
[im 32/95  soft-tissue]
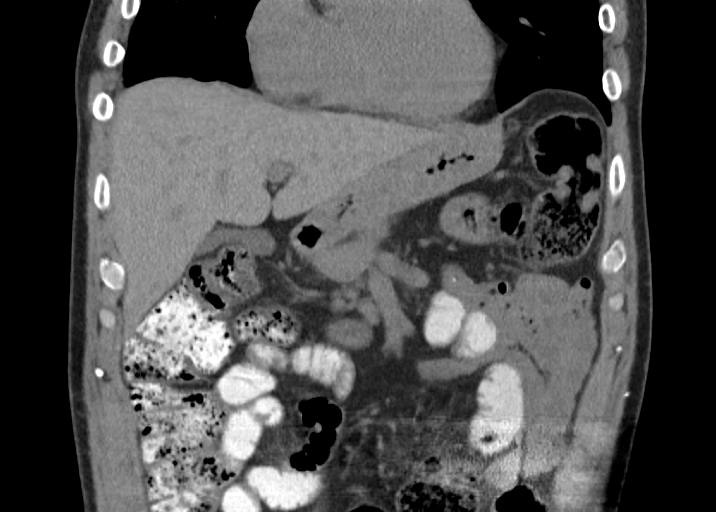
[im 32/95  bone]
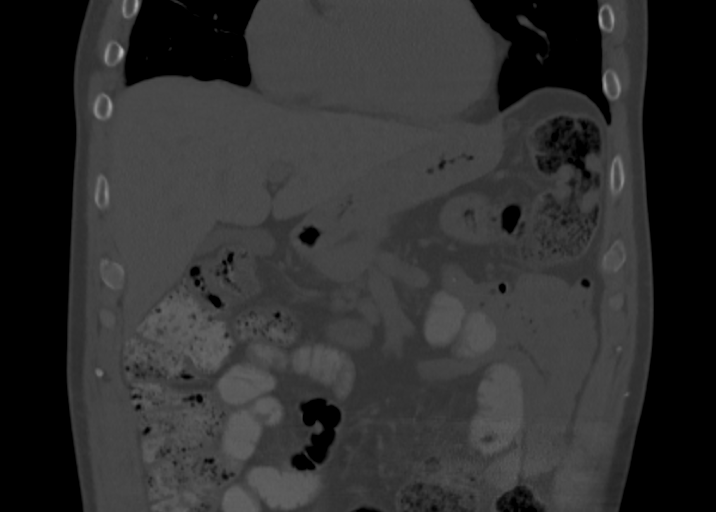
[im 63/95  soft-tissue]
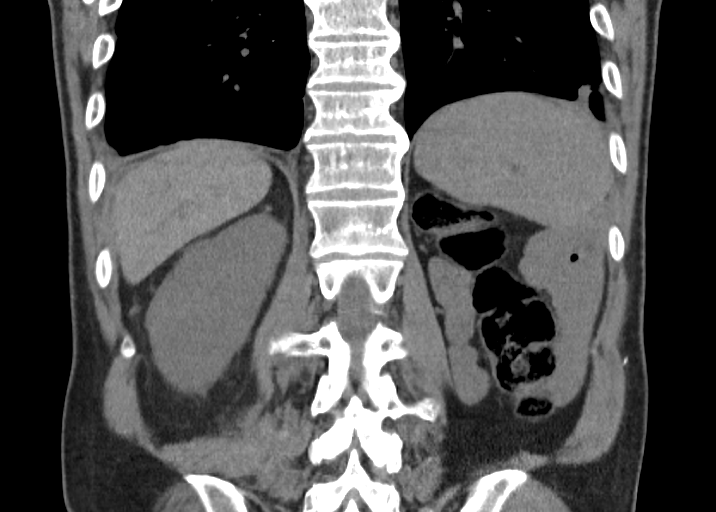

[Series 11: axial nephro · axial · 0.77mm/px · z∈[-625,-97]mm · 7 of 236 slices shown, 12 images]
[im 30/236  soft-tissue]
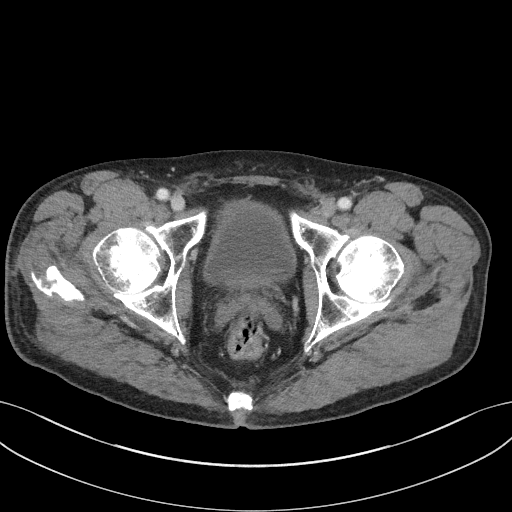
[im 30/236  bone]
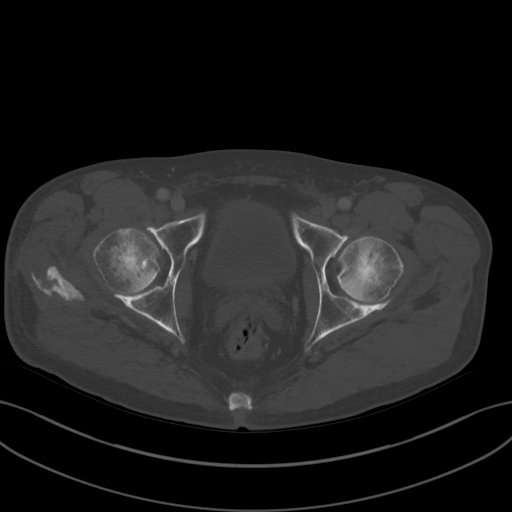
[im 59/236  soft-tissue]
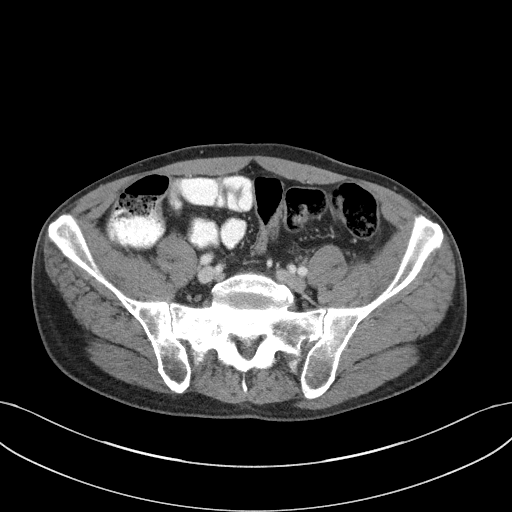
[im 89/236  soft-tissue]
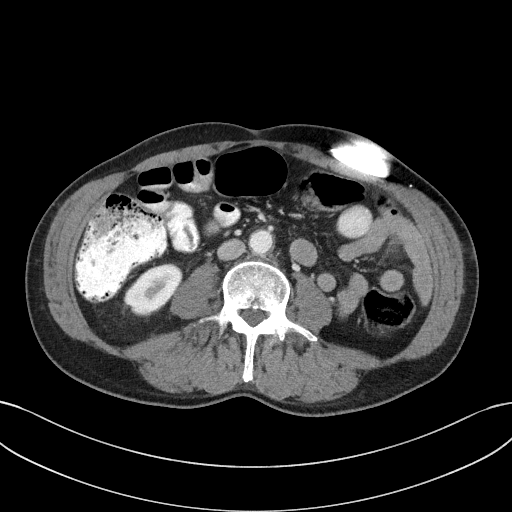
[im 118/236  soft-tissue]
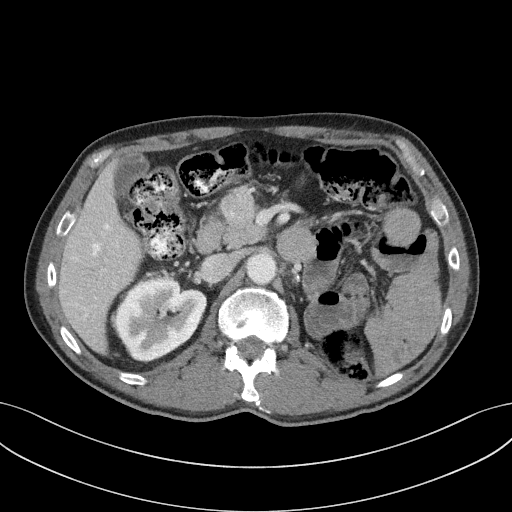
[im 118/236  lung]
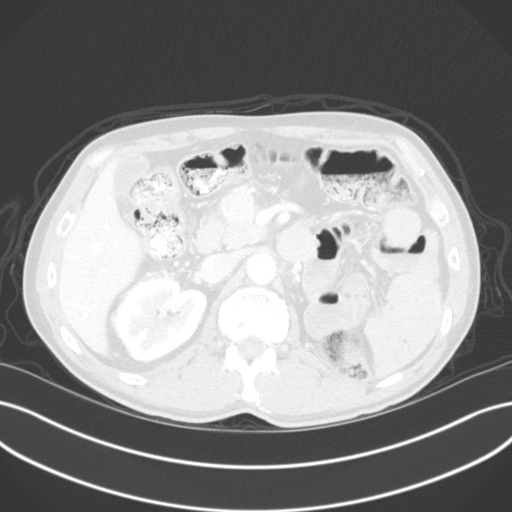
[im 147/236  soft-tissue]
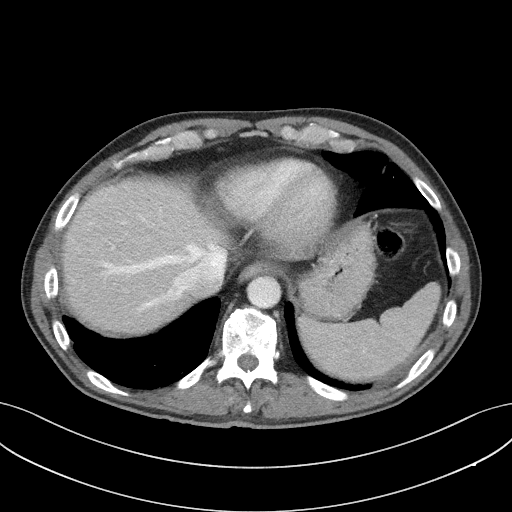
[im 147/236  lung]
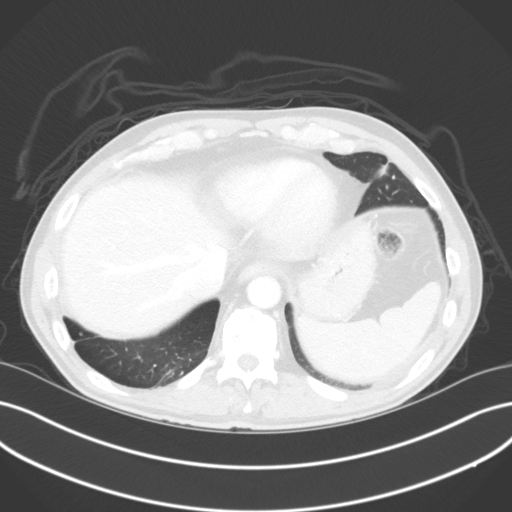
[im 177/236  soft-tissue]
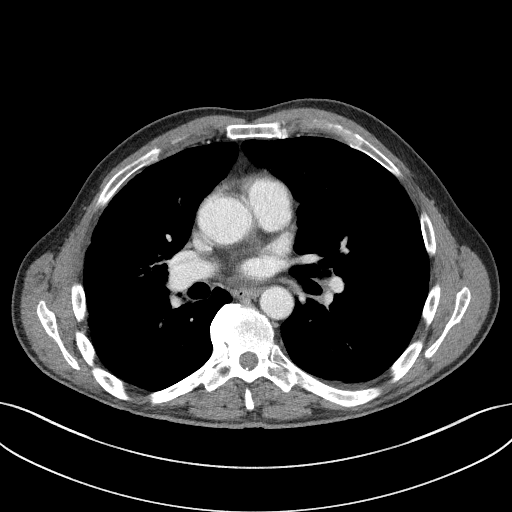
[im 177/236  lung]
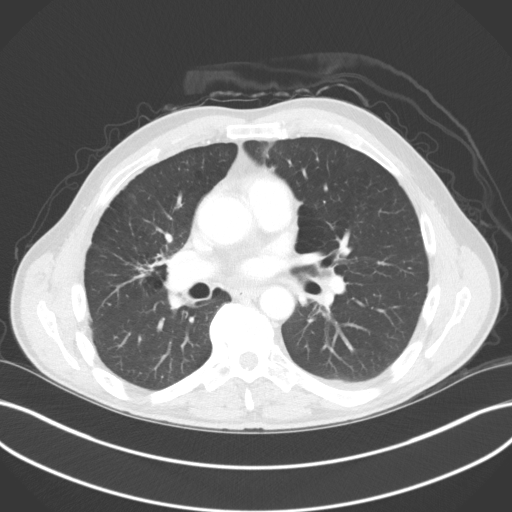
[im 206/236  soft-tissue]
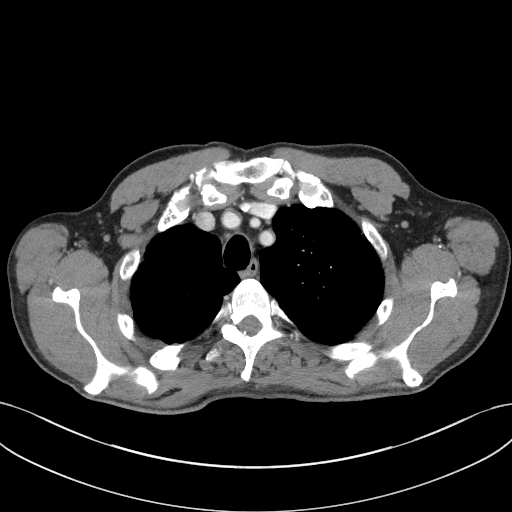
[im 206/236  lung]
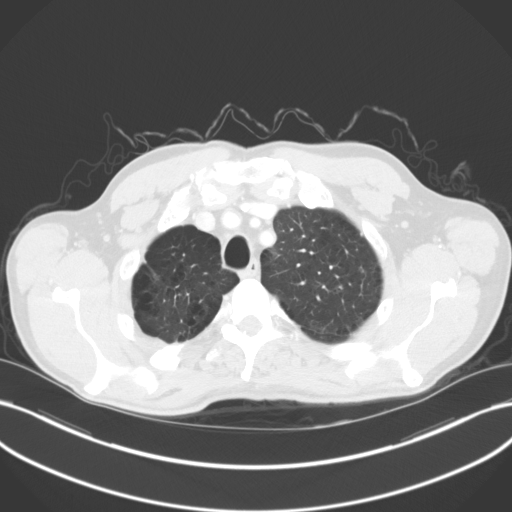

[9 of 46 positions shown; findings below may reference images not displayed]

FINDINGS: CT CHEST FINDINGS

Cardiovascular: Ascending aortic aneurysm at 4.0 cm in diameter.

Mediastinum/Nodes: Left hilar lymph node 1.3 cm in short axis on
image 54/11, stable.

Lungs/Pleura: A primarily right middle lobe nodule possibly spanning
into part of the right upper lobe measures proximally 3.3 by 2.2 cm
and by my measurements was previously 3.4 by 2.1 cm, essentially
stable. Pre similar morphology and overall distribution compared to
the prior exam.

A separate suprahilar right upper lobe nodule measures 3.5 by 1.5 cm
on image 61/15, previously same by my measurements.

There is some new clustered nodularity anteriorly in the right upper
lobe on image 68/15 which is mostly sub solid and likely
inflammatory, but which merits surveillance.

Scarring at the right lung base. Postoperative findings at the right
lung base.

Stable 1.7 by 1.6 cm sub solid nodule in the left lower lobe on
image 95/5.

New indistinctly marginated 4 mm sub solid nodule in the left upper
lobe on image 82/15.

Paraseptal and centrilobular emphysema.

Stable left basilar pleural thickening versus loculated effusion, 1
cm in thickness on image 83/11, with adjacent nodularity or
scarring.

Musculoskeletal: Old right posterior fourth rib deformity related to
metastatic lesion. An epidural catheter is noted extending into the
upper thoracic region. Thoracic spondylosis.

CT ABDOMEN AND PELVIS FINDINGS

Hepatobiliary: The peripheral subcapsular arterial phase enhancing
nodule in the right hepatic lobe measures 1.1 by 0.6 and by my
measurement was previously the same. This lesion was visible on
07/28/2017 and remains somewhat nonspecific. The lesion is poorly
seen on delayed images and may be a small vascular malformation.

Pancreas: A mass of the pancreatic body measures 2.8 by 1.9 cm on
image 109/11, stable. There is dilation of the dorsal pancreatic
duct distal to the level of this mass. The mass along the anterior
margin of the pancreatic head measures 3.1 by 3.2 cm on image
118/11, formerly 3.0 by 3.2 cm, stable. A third, small tumor nodule
measuring 0.8 cm in diameter is present along the anterior margin of
the pancreatic tail on image 107/11, stable.

Spleen: Unremarkable

Adrenals/Urinary Tract: The adrenal glands appear unremarkable.
Adjacent small left sided lymph nodes are present near the adrenal
gland, unchanged. Left nephrectomy noted. No urinary tract calculi.
No right renal mass.

Stomach/Bowel: Unremarkable

Vascular/Lymphatic: Aortoiliac atherosclerotic vascular disease.

Reproductive: Unremarkable

Other: No supplemental non-categorized findings.

Musculoskeletal: Sclerosis surrounding a lucent right sacral lesion
with the region of lucency measuring 2.6 by 3.0 cm on image 182/11,
formerly the same.

Right hip screw observed with a lytic lesion along the anterior
margin of the hip screw with cortical discontinuity on image 230/11,
not appreciably changed from prior.

Oval-shaped lytic lesion with sclerotic margins in the right
inferior pubic ramus, 1.2 by 2.1 cm on image 223/11, stable. No new
bony lesions identified.
IMPRESSION: 1. Overall stable pulmonary nodules, pancreatic masses, and osseous
metastatic lesions compared to 07/01/2018.
2. Stable subcapsular arterial phase enhancing nodule posteriorly in
the right hepatic lobe, this could well be a benign vascular
malformation but is technically nonspecific. Surveillance suggested.
3. Ascending aortic aneurysm, 4.0 cm in diameter. Recommend annual
imaging followup by CTA or MRA. This recommendation follows 7414
ACCF/AHA/AATS/ACR/ASA/SCA/HEMLESH/MANLEY/SIBASHVILI/GIORGI Guidelines for the
Diagnosis and Management of Patients with Thoracic Aortic Disease.
Circulation. 7414; 121: [id number]
4. New clustered sub solid nodularity anteriorly in the right upper
lobe on image 68/15, and new 4 mm sub solid nodule in the left upper
lobe on image 82/15, probably postinflammatory, surveillance
suggested.
5. Other imaging findings of potential clinical significance: Aortic
Atherosclerosis (6SLT3-J29.9) and Emphysema (6SLT3-FXU.7).

## 2020-10-09 ENCOUNTER — Other Ambulatory Visit: Payer: Self-pay | Admitting: Oncology

## 2020-10-09 DIAGNOSIS — G894 Chronic pain syndrome: Secondary | ICD-10-CM | POA: Diagnosis not present

## 2020-10-09 MED ORDER — ALPRAZOLAM 1 MG PO TABS: ORAL_TABLET | ORAL | 0 refills | Status: DC

## 2020-10-10 ENCOUNTER — Ambulatory Visit: Payer: Medicare Other | Admitting: Gastroenterology

## 2020-10-10 ENCOUNTER — Encounter: Payer: Self-pay | Admitting: Gastroenterology

## 2020-10-10 VITALS — BP 140/82 | HR 82 | Ht 75.0 in | Wt 160.0 lb

## 2020-10-10 DIAGNOSIS — R112 Nausea with vomiting, unspecified: Secondary | ICD-10-CM | POA: Diagnosis not present

## 2020-10-10 MED ORDER — ONDANSETRON HCL 4 MG PO TABS
ORAL_TABLET | ORAL | 11 refills | Status: DC
Start: 2020-10-10 — End: 2021-12-17

## 2020-10-10 NOTE — Patient Instructions (Signed)
If you are age 53 or older, your body mass index should be between 23-30. Your Body mass index is 20 kg/m. If this is out of the aforementioned range listed, please consider follow up with your Primary Care Provider.  If you are age 13 or younger, your body mass index should be between 19-25. Your Body mass index is 20 kg/m. If this is out of the aformentioned range listed, please consider follow up with your Primary Care Provider.   STOP: Zofran 8mg   We have sent the following medications to your pharmacy for you to pick up at your convenience:  START: Zofran 4mg  take one tablet every morning shortly after you wake up. You can take one later in the day if needed.  Thank you for entrusting me with your care and choosing [facility].  Dr Ardis Hughs

## 2020-10-10 NOTE — Progress Notes (Signed)
HPI: This is a very pleasant 54 year old man who was last here in our office about 6 months ago  He has widely metastatic renal cancer.  It looks like it is spread to his bones, his pancreas, possibly his liver, and also his lungs.  He has intermittent hemoptysis.  We discussed that hemoptysis is from his lungs and not from his GI tract.  He has intermittent nausea and vomiting 2-4 times per week.  He cannot predict when this is can happen.  He never sees blood when he vomits  Old Data Reviewed:   I have never met this man in the office before.  He has been seen by various extenders here however.  His last office visit was about 6 months ago he saw [place].  He has known metastatic renal cell cancer and is on chemotherapy for it.  He was admitted March 2021 with severe anemia and GI bleeding.  He received 4 units of blood throughout that admission.  He had work-up including a upper endoscopy, colonoscopy and capsule endoscopy which really never revealed an obvious source of his bleeding.  EGD showed friable gastritis, colonoscopy suggested dark blood throughout his colon without an obvious source.  Capsule endoscopy showed poor visualization but there was no overt bleeding.   He is followed by Dr. [name] from oncology.  He has bony mets from his renal cell cancer.  He also has mets to his lungs and periodic hemoptysis  He is on a blood thinner Xarelto, I am not sure why.  He had a CT scan abdomen pelvis June 2021 and this showed increased size of his left hilar mass.  There is also some concern for hypervascular metastatic lesions to his liver.  There was metastatic lesion in his pancreas.  There were lytic lesions in his femur and sacrum.  He had some pelvic ascites.  Gallbladder wall is a bit thick as well.   Review of systems: Pertinent positive and negative review of systems were noted in the above HPI section. All other review negative.   Past Medical History:  Diagnosis Date  .  Diabetes mellitus without complication ([place])    diet controlled only  . GI bleed   . left renal ca d'd 2008  . 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 BIOPSY  08/28/2019   Procedure: BIOPSY;  Surgeon: Thornton Park, MD;  Location: [facility];  Service: Gastroenterology;;  . BIOPSY  03/01/2020   Procedure: BIOPSY;  Surgeon: Mauri Pole, MD;  Location: [facility];  Service: Endoscopy;;  . COLONOSCOPY W/ POLYPECTOMY    . COLONOSCOPY WITH PROPOFOL N/A 03/01/2020   Procedure: COLONOSCOPY WITH PROPOFOL;  Surgeon: Mauri Pole, MD;  Location: [facility];  Service: Endoscopy;  Laterality: N/A;  . ESOPHAGOGASTRODUODENOSCOPY (EGD) WITH PROPOFOL N/A 08/28/2019   Procedure: ESOPHAGOGASTRODUODENOSCOPY (EGD) WITH PROPOFOL;  Surgeon: Thornton Park, MD;  Location: [facility];  Service: Gastroenterology;  Laterality: N/A;  . ESOPHAGOGASTRODUODENOSCOPY (EGD) WITH PROPOFOL N/A 03/01/2020   Procedure: ESOPHAGOGASTRODUODENOSCOPY (EGD) WITH PROPOFOL;  Surgeon: Mauri Pole, MD;  Location: [facility];  Service: Endoscopy;  Laterality: N/A;  . FEMUR IM NAIL Right 02/16/2018   Procedure: INTRAMEDULLARY (IM) NAIL RIGHT FEMORAL;  Surgeon: Paralee Cancel, MD;  Location: [facility];  Service: Orthopedics;  Laterality: Right;  . GIVENS CAPSULE STUDY N/A 03/03/2020   Procedure: GIVENS CAPSULE  STUDY;  Surgeon: Mauri Pole, MD;  Location: [facility];  Service: Endoscopy;  Laterality: N/A;  . HAND SURGERY Right   . IR ANGIOGRAM EXTREMITY RIGHT  02/15/2018  . IR ANGIOGRAM SELECTIVE EACH ADDITIONAL VESSEL  02/15/2018  . IR ANGIOGRAM SELECTIVE EACH ADDITIONAL VESSEL  02/15/2018  . IR ANGIOGRAM SELECTIVE EACH ADDITIONAL VESSEL  02/15/2018  . IR ANGIOGRAM SELECTIVE EACH ADDITIONAL VESSEL  02/15/2018  . IR EMBO TUMOR ORGAN ISCHEMIA INFARCT INC GUIDE ROADMAPPING   02/15/2018  . IR EMBO TUMOR ORGAN ISCHEMIA INFARCT INC GUIDE ROADMAPPING  02/15/2018  . IR US GUIDE VASC ACCESS LEFT  02/15/2018  . LUNG REMOVAL, PARTIAL Right 09/2008  . NEPHRECTOMY RADICAL     Left   . PAIN PUMP IMPLANTATION N/A 05/16/2015   Procedure: Intrathecal pain pump placement;  Surgeon: Clydell Hakim, MD;  Location: [facility];  Service: Neurosurgery;  Laterality: N/A;  Intrathecal pain pump placement    Current Outpatient Medications  Medication Sig Dispense Refill  . ALPRAZolam (XANAX) 1 MG tablet TAKE 1 TABLET BY MOUTH THREE TIMES A DAY AS NEEDED FOR ANXIETY 90 tablet 0  . CABOMETYX 60 MG tablet TAKE 1 TABLET (60 MG) BY  MOUTH ONE TIME DAILY WITH  AT LEAST 8 OZ OF WATER ON  AN EMPTY STOMACH. DO NOT  EAT FOR 2 HOURS BEFORE OR 1 HOUR A (Patient taking differently: Take 60 mg by mouth daily. ) 30 tablet 11  . calcium carbonate (TUMS - DOSED IN MG ELEMENTAL CALCIUM) 500 MG chewable tablet Chew 1 tablet by mouth 3 (three) times daily as needed for indigestion or heartburn.     . DULoxetine (CYMBALTA) 60 MG capsule TAKE 1 CAPSULE (60 MG TOTAL) BY MOUTH 2 (TWO) TIMES DAILY. 180 capsule 0  . gabapentin (NEURONTIN) 300 MG capsule TAKE 1 CAPSULE BY MOUTH THREE TIMES A DAY (Patient taking differently: Take 300 mg by mouth 3 (three) times daily. ) 270 capsule 0  . HYDROmorphone (DILAUDID) 8 MG tablet Take 1 tablet (8 mg total) by mouth every 4 (four) hours as needed for severe pain. (Patient taking differently: Take 8 mg by mouth every 4 (four) hours as needed for moderate pain or severe pain. ) 30 tablet 0  . hydroxypropyl methylcellulose / hypromellose (ISOPTO TEARS / GONIOVISC) 2.5 % ophthalmic solution Place 1 drop into both eyes 3 (three) times daily as needed for dry eyes.    [name] loratadine (CLARITIN) 10 MG tablet Take 10 mg by mouth daily as needed for allergies.     [name] ondansetron (ZOFRAN-ODT) 8 MG disintegrating tablet Take 8 mg by mouth daily as needed for nausea or vomiting.     [name] PAIN  MANAGEMENT INTRATHECAL, IT, PUMP 1 each by Intrathecal route continuous. Intrathecal (IT) medication:   Morphine.    . pantoprazole (PROTONIX) 40 MG tablet Take 1 tablet (40 mg total) by mouth 2 (two) times daily. 30 tablet 11  . tiZANidine (ZANAFLEX) 4 MG tablet Take 1 tablet (4 mg total) by mouth every 8 (eight) hours as needed for muscle spasms. 30 tablet 0  . XARELTO 20 MG TABS tablet TAKE 1 TABLET (20 MG TOTAL) BY MOUTH DAILY WITH SUPPER. (Patient taking differently: Take 20 mg by mouth daily. ) 90 tablet 3  . ferrous sulfate 325 (65 FE) MG tablet Take 1 tablet (325 mg total) by mouth daily with breakfast. 60 tablet 3  . sucralfate (CARAFATE) 1 GM/10ML suspension Take 10 mLs (1 g total) by mouth every 6 (  six) hours. (Patient not taking: Reported on 06/30/2020) 1200 mL 0   No current facility-administered medications for this visit.    Allergies as of 10/10/2020 - Review Complete 10/10/2020  Allergen Reaction Noted  . Ceftriaxone Hives 08/06/2011  . Hydrocodone Swelling 08/06/2011    Family History  Problem Relation Age of Onset  . Hypertension Mother   . Diabetes Brother   . Irritable bowel syndrome Sister   . Cancer Maternal Aunt        ovarian  . Colon cancer Neg Hx     Social History   Socioeconomic History  . Marital status: Married    Spouse name: Not on file  . Number of children: 2  . Years of education: Not on file  . Highest education level: Not on file  Occupational History  . Occupation: Clinical biochemist   Tobacco Use  . Smoking status: Current Every Day Smoker    Packs/day: 1.00    Years: 30.00    Pack years: 30.00    Types: Cigarettes  . Smokeless tobacco: Never Used  Substance and Sexual Activity  . Alcohol use: No  . Drug use: Yes    Types: Marijuana    Comment: occasional. Last used: last night.   . Sexual activity: Not Currently  Other Topics Concern  . Not on file  Social History Narrative   6 caffeine drinks daily    08-11-18  Unable to ask abuse  questions wife with him today.   Social Determinants of Health   Financial Resource Strain:   . Difficulty of Paying Living Expenses: Not on file  Food Insecurity:   . Worried About Charity fundraiser in the Last Year: Not on file  . Ran Out of Food in the Last Year: Not on file  Transportation Needs:   . Lack of Transportation (Medical): Not on file  . Lack of Transportation (Non-Medical): Not on file  Physical Activity:   . Days of Exercise per Week: Not on file  . Minutes of Exercise per Session: Not on file  Stress:   . Feeling of Stress : Not on file  Social Connections:   . Frequency of Communication with Friends and Family: Not on file  . Frequency of Social Gatherings with Friends and Family: Not on file  . Attends Religious Services: Not on file  . Active Member of Clubs or Organizations: Not on file  . Attends Archivist Meetings: Not on file  . Marital Status: Not on file  Intimate Partner Violence:   . Fear of Current or Ex-Partner: Not on file  . Emotionally Abused: Not on file  . Physically Abused: Not on file  . Sexually Abused: Not on file     Physical Exam: BP 140/82   Pulse 82   Ht 6' 3"  (1.905 m)   Wt 160 lb (72.6 kg)   BMI 20.00 kg/m  Constitutional: Chronically ill-appearing, somewhat cachectic Psychiatric: alert and oriented x3 Eyes: extraocular movements intact Mouth: oral pharynx moist, no lesions Neck: supple no lymphadenopathy Cardiovascular: heart regular rate and rhythm Lungs: clear to auscultation bilaterally Abdomen: soft, nontender, nondistended, no obvious ascites, no peritoneal signs, normal bowel sounds Extremities: no lower extremity edema bilaterally Skin: no lesions on visible extremities   Assessment and plan: 54 y.o. male with widely metastatic renal cell cancer  He has mets to his lungs, his pancreas, his bones, possibly his liver as well.  We discussed that his hemoptysis is not from his GI tract  and instead it  is probably from lung mets.  He has intermittent nausea and vomiting.  I recommended that he stop taking his 8 mg Zofran on an as-needed basis and instead I prescribed him 4 mg of Zofran to take every morning shortly after he wakes up like a vitamin and then repeat that Zofran dose later in the day if he is still nauseous.  He does not need any endoscopic or imaging testing at this point.  He will follow up on as-needed basis.    Please see the "Patient Instructions" section for addition details about the plan.   Owens Loffler, MD [facility] 10/10/2020, 9:06 AM  Cc: [name], MD  Total time on date of encounter was 30 minutes (this included time spent preparing to see the patient reviewing records; obtaining and/or reviewing separately obtained history; performing a medically appropriate exam and/or evaluation; counseling and educating the patient and family if present; ordering medications, tests or procedures if applicable; and documenting clinical information in the health record).

## 2020-10-11 DIAGNOSIS — G620 Drug-induced polyneuropathy: Secondary | ICD-10-CM | POA: Diagnosis not present

## 2020-10-11 DIAGNOSIS — M79662 Pain in left lower leg: Secondary | ICD-10-CM | POA: Diagnosis not present

## 2020-10-11 DIAGNOSIS — Z9689 Presence of other specified functional implants: Secondary | ICD-10-CM | POA: Diagnosis not present

## 2020-10-11 DIAGNOSIS — C642 Malignant neoplasm of left kidney, except renal pelvis: Secondary | ICD-10-CM | POA: Diagnosis not present

## 2020-10-24 ENCOUNTER — Other Ambulatory Visit: Payer: Self-pay | Admitting: Gastroenterology

## 2020-10-31 ENCOUNTER — Other Ambulatory Visit: Payer: Self-pay | Admitting: Oncology

## 2020-10-31 MED ORDER — ALPRAZOLAM 1 MG PO TABS: ORAL_TABLET | ORAL | 0 refills | Status: DC

## 2020-11-20 ENCOUNTER — Inpatient Hospital Stay: Payer: Medicare Other | Attending: Oncology

## 2020-11-20 ENCOUNTER — Other Ambulatory Visit: Payer: Self-pay

## 2020-11-20 ENCOUNTER — Ambulatory Visit (HOSPITAL_COMMUNITY)
Admission: RE | Admit: 2020-11-20 | Discharge: 2020-11-20 | Disposition: A | Discharge status: Home / Self Care | Payer: Medicare Other | Source: Ambulatory Visit | Attending: Oncology | Admitting: Oncology

## 2020-11-20 DIAGNOSIS — C7802 Secondary malignant neoplasm of left lung: Secondary | ICD-10-CM | POA: Insufficient documentation

## 2020-11-20 DIAGNOSIS — I700 Atherosclerosis of aorta: Secondary | ICD-10-CM | POA: Diagnosis not present

## 2020-11-20 DIAGNOSIS — C7801 Secondary malignant neoplasm of right lung: Secondary | ICD-10-CM | POA: Diagnosis not present

## 2020-11-20 DIAGNOSIS — C649 Malignant neoplasm of unspecified kidney, except renal pelvis: Secondary | ICD-10-CM | POA: Diagnosis not present

## 2020-11-20 DIAGNOSIS — C7951 Secondary malignant neoplasm of bone: Secondary | ICD-10-CM | POA: Diagnosis not present

## 2020-11-20 DIAGNOSIS — J90 Pleural effusion, not elsewhere classified: Secondary | ICD-10-CM | POA: Insufficient documentation

## 2020-11-20 DIAGNOSIS — K869 Disease of pancreas, unspecified: Secondary | ICD-10-CM | POA: Diagnosis not present

## 2020-11-20 LAB — CBC WITH DIFFERENTIAL (CANCER CENTER ONLY)
Abs Immature Granulocytes: 0.01 10*3/uL (ref 0.00–0.07)
Basophils Absolute: 0.1 10*3/uL (ref 0.0–0.1)
Basophils Relative: 1 %
Eosinophils Absolute: 0.2 10*3/uL (ref 0.0–0.5)
Eosinophils Relative: 3 %
HCT: 27.6 % — ABNORMAL LOW (ref 39.0–52.0)
Hemoglobin: 8 g/dL — ABNORMAL LOW (ref 13.0–17.0)
Immature Granulocytes: 0 %
Lymphocytes Relative: 19 %
Lymphs Abs: 1.2 10*3/uL (ref 0.7–4.0)
MCH: 32.4 pg (ref 26.0–34.0)
MCHC: 29 g/dL — ABNORMAL LOW (ref 30.0–36.0)
MCV: 111.7 fL — ABNORMAL HIGH (ref 80.0–100.0)
Monocytes Absolute: 0.3 10*3/uL (ref 0.1–1.0)
Monocytes Relative: 5 %
Neutro Abs: 4.5 10*3/uL (ref 1.7–7.7)
Neutrophils Relative %: 72 %
Platelet Count: 114 10*3/uL — ABNORMAL LOW (ref 150–400)
RBC: 2.47 MIL/uL — ABNORMAL LOW (ref 4.22–5.81)
RDW: 16.4 % — ABNORMAL HIGH (ref 11.5–15.5)
WBC Count: 6.3 10*3/uL (ref 4.0–10.5)
nRBC: 0 % (ref 0.0–0.2)

## 2020-11-20 LAB — CMP (CANCER CENTER ONLY)
ALT: 14 U/L (ref 0–44)
AST: 18 U/L (ref 15–41)
Albumin: 2.2 g/dL — ABNORMAL LOW (ref 3.5–5.0)
Alkaline Phosphatase: 76 U/L (ref 38–126)
Anion gap: 5 (ref 5–15)
BUN: 18 mg/dL (ref 6–20)
CO2: 24 mmol/L (ref 22–32)
Calcium: 7.7 mg/dL — ABNORMAL LOW (ref 8.9–10.3)
Chloride: 113 mmol/L — ABNORMAL HIGH (ref 98–111)
Creatinine: 1.22 mg/dL (ref 0.61–1.24)
GFR, Estimated: >60 mL/min (ref >60)
Glucose, Bld: 107 mg/dL — ABNORMAL HIGH (ref 70–99)
Potassium: 4.3 mmol/L (ref 3.5–5.1)
Sodium: 142 mmol/L (ref 135–145)
Total Bilirubin: 0.2 mg/dL — ABNORMAL LOW (ref 0.3–1.2)
Total Protein: 5.6 g/dL — ABNORMAL LOW (ref 6.5–8.1)

## 2020-11-20 MED ORDER — IOHEXOL 300 MG/ML  SOLN
100.0000 mL | Freq: Once | INTRAMUSCULAR | Status: AC | PRN
  Administered 2020-11-20: 100 mL via INTRAVENOUS

## 2020-11-22 ENCOUNTER — Inpatient Hospital Stay: Payer: Medicare Other | Attending: Oncology | Admitting: Oncology

## 2020-11-22 ENCOUNTER — Other Ambulatory Visit: Payer: Self-pay

## 2020-11-22 VITALS — BP 187/105 | HR 78 | Temp 97.2°F | Resp 18 | Ht 75.0 in | Wt 175.9 lb

## 2020-11-22 DIAGNOSIS — C7800 Secondary malignant neoplasm of unspecified lung: Secondary | ICD-10-CM | POA: Diagnosis not present

## 2020-11-22 DIAGNOSIS — I2699 Other pulmonary embolism without acute cor pulmonale: Secondary | ICD-10-CM | POA: Diagnosis not present

## 2020-11-22 DIAGNOSIS — D649 Anemia, unspecified: Secondary | ICD-10-CM | POA: Diagnosis not present

## 2020-11-22 DIAGNOSIS — Z923 Personal history of irradiation: Secondary | ICD-10-CM | POA: Diagnosis not present

## 2020-11-22 DIAGNOSIS — C649 Malignant neoplasm of unspecified kidney, except renal pelvis: Secondary | ICD-10-CM | POA: Diagnosis not present

## 2020-11-22 DIAGNOSIS — F419 Anxiety disorder, unspecified: Secondary | ICD-10-CM | POA: Insufficient documentation

## 2020-11-22 DIAGNOSIS — Z7901 Long term (current) use of anticoagulants: Secondary | ICD-10-CM | POA: Diagnosis not present

## 2020-11-22 DIAGNOSIS — F32A Depression, unspecified: Secondary | ICD-10-CM | POA: Insufficient documentation

## 2020-11-22 DIAGNOSIS — Z905 Acquired absence of kidney: Secondary | ICD-10-CM | POA: Diagnosis not present

## 2020-11-22 NOTE — Progress Notes (Signed)
Hematology and Oncology Follow Up   Keith [name] [id number] Jan 22, 1966 54 y.o. 11/22/2020 8:26 AM Keith [name], MDPharr, Keith Jew, MD       Principle Diagnosis: 54 year old man with  kidney cancer diagnosed in 2009.  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   Prior Therapy: 1. Status post laparoscopic radical nephrectomy.  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  2. Patient status post thoracotomy for a synchronous metastatic lung lesions done October 2009.   3. Patient is status post stereotactic radiotherapy to pulmonary nodules in May of 2010. 4. He is S/P Sutent 50 mg 4 weeks on 2 weeks off from 10/2010 to 03/2013. He progressed at that time.  5. He is S/P radiation to the right sacral bone between 4/22 to 4/30.  6. He is S/P XRT to the left shoulder 03/20/14 to 03/31/14. 7. Votrient 800 mg by mouth daily from 03/2013 through 06/22/2015. Discontinued secondary to disease progression. 8. Nivolumab 3 mg/kg given every 2 weeks started on 06/29/2015. He is status post 4 cycles completed 08/10/2015. He developed disease progression in September 2016.  9. Status post radiation therapy to the left mid fibula completed on 11/14/2015. He received a grade 1 fraction. 10. He is status post radiation to the proximal and distal femur over 2 weeks and 10 fractions of total of 30 Gy.  This was completed in March 2019. 11.   Radiation therapy to the right tibia.  Last treatment completed in September 2019.   Current therapy: Cabometyx 60 mg daily started in November 2016.   Interim History: Keith [name] is here for return evaluation.  Since the last visit, he reports no major changes in his health.  He continues to tolerate Cabometyx without any major complaints.  He denies any nausea, vomiting or abdominal pain.  He denied any worsening diarrhea.  He is eating better and has gained weight since the last  visit.  He denies any hematochezia or melena.    Medications: Updated on review. Current Outpatient Medications  Medication Sig Dispense Refill  . ALPRAZolam (XANAX) 1 MG tablet TAKE 1 TABLET BY MOUTH THREE TIMES A DAY AS NEEDED FOR ANXIETY 90 tablet 0  . CABOMETYX 60 MG tablet TAKE 1 TABLET (60 MG) BY  MOUTH ONE TIME DAILY WITH  AT LEAST 8 OZ OF WATER ON  AN EMPTY STOMACH. DO NOT  EAT FOR 2 HOURS BEFORE OR 1 HOUR A (Patient taking differently: Take 60 mg by mouth daily. ) 30 tablet 11  . calcium carbonate (TUMS - DOSED IN MG ELEMENTAL CALCIUM) 500 MG chewable tablet Chew 1 tablet by mouth 3 (three) times daily as needed for indigestion or heartburn.     . DULoxetine (CYMBALTA) 60 MG capsule TAKE 1 CAPSULE (60 MG TOTAL) BY MOUTH 2 (TWO) TIMES DAILY. 180 capsule 0  . ferrous sulfate 325 (65 FE) MG tablet TAKE 1 TABLET BY MOUTH EVERY DAY WITH BREAKFAST 90 tablet 2  . gabapentin (NEURONTIN) 300 MG capsule TAKE 1 CAPSULE BY MOUTH THREE TIMES A DAY (Patient taking differently: Take 300 mg by mouth 3 (three) times daily. ) 270 capsule 0  . HYDROmorphone (DILAUDID) 8 MG tablet Take 1 tablet (8 mg total) by mouth every 4 (four) hours as needed for severe pain. (Patient taking differently: Take 8 mg by mouth every 4 (four) hours as needed for moderate pain or severe pain. ) 30 tablet 0  .  hydroxypropyl methylcellulose / hypromellose (ISOPTO TEARS / GONIOVISC) 2.5 % ophthalmic solution Place 1 drop into both eyes 3 (three) times daily as needed for dry eyes.    [name] loratadine (CLARITIN) 10 MG tablet Take 10 mg by mouth daily as needed for allergies.     [name] ondansetron (ZOFRAN) 4 MG tablet Take one tablet every morning.  Can take one tablet later in the day on an as needed basis 50 tablet 11  . PAIN MANAGEMENT INTRATHECAL, IT, PUMP 1 each by Intrathecal route continuous. Intrathecal (IT) medication:   Morphine.    . pantoprazole (PROTONIX) 40 MG tablet Take 1 tablet (40 mg total) by mouth 2 (two) times daily. 30  tablet 11  . sucralfate (CARAFATE) 1 GM/10ML suspension Take 10 mLs (1 g total) by mouth every 6 (six) hours. (Patient not taking: Reported on 06/30/2020) 1200 mL 0  . tiZANidine (ZANAFLEX) 4 MG tablet Take 1 tablet (4 mg total) by mouth every 8 (eight) hours as needed for muscle spasms. 30 tablet 0  . XARELTO 20 MG TABS tablet TAKE 1 TABLET (20 MG TOTAL) BY MOUTH DAILY WITH SUPPER. (Patient taking differently: Take 20 mg by mouth daily. ) 90 tablet 3   No current facility-administered medications for this visit.     Allergies:  Allergies  Allergen Reactions  . Ceftriaxone Hives  . Hydrocodone Swelling    Physical examination:  Blood pressure (!) 187/105, pulse 78, temperature (!) 97.2 F (36.2 C), temperature source Tympanic, resp. rate 18, height 6\' 3"  (1.905 m), weight 175 lb 14.4 oz (79.8 kg), SpO2 98 %.    ECOG 1   General appearance: Comfortable appearing without any discomfort Head: Normocephalic without any trauma Oropharynx: Mucous membranes are moist and pink without any thrush or ulcers. Eyes: Pupils are equal and round reactive to light. Lymph nodes: No cervical, supraclavicular, inguinal or axillary lymphadenopathy.   Heart:regular rate and rhythm.  S1 and S2 without leg edema. Lung: Clear without any rhonchi or wheezes.  No dullness to percussion. Abdomin: Soft, nontender, nondistended with good bowel sounds.  No hepatosplenomegaly. Musculoskeletal: No joint deformity or effusion.  Full range of motion noted. Neurological: No deficits noted on motor, sensory and deep tendon reflex exam. Skin: No petechial rash or dryness.  Appeared moist.              Lab Results: Lab Results  Component Value Date   WBC 6.3 11/20/2020   HGB 8.0 (L) 11/20/2020   HCT 27.6 (L) 11/20/2020   MCV 111.7 (H) 11/20/2020   PLT 114 (L) 11/20/2020     Chemistry      Component Value Date/Time   NA 142 11/20/2020 1120   NA 142 12/24/2017 1317   K 4.3 11/20/2020 1120   K  4.3 12/24/2017 1317   CL 113 (H) 11/20/2020 1120   CL 106 05/24/2013 1436   CO2 24 11/20/2020 1120   CO2 32 (H) 12/24/2017 1317   BUN 18 11/20/2020 1120   BUN 10.3 12/24/2017 1317   CREATININE 1.22 11/20/2020 1120   CREATININE 0.9 12/24/2017 1317   GLU 160 (H) 06/29/2015 0957      Component Value Date/Time   CALCIUM 7.7 (L) 11/20/2020 1120   CALCIUM 8.8 12/24/2017 1317   ALKPHOS 76 11/20/2020 1120   ALKPHOS 95 12/24/2017 1317   AST 18 11/20/2020 1120   AST 20 12/24/2017 1317   ALT 14 11/20/2020 1120   ALT 25 12/24/2017 1317   BILITOT 0.2 (L) 11/20/2020  1120   BILITOT 0.25 12/24/2017 1317      IMPRESSION: 1. Metastatic disease in the lungs appears essentially stable compared to the prior examination allowing for differences in measurement technique between today's study and the prior examination. No new metastatic disease identified in the lungs. 2. Previously suspected hypervascular lesions in the liver are not confidently identified on today's study, presumably benign perfusion anomalies on the prior study (which included in arterial phase which was not present on today's examination). 3. Heterogeneously enhancing lesions in the pancreatic neck and body, similar to the prior examination. 4. Lytic lesions in the right proximal femur and bony pelvis are similar to prior examination. 5. Moderate left and trace right pleural effusions, similar to the prior study. 6. Aortic atherosclerosis. 7. Additional incidental findings, similar to prior studies, as above.  Impression and Plan:   54 year old man with  1.    Clear-cell renal cell carcinoma diagnosed 2009.  He developed stage IV with lung involvement.   He continues to tolerate Cabometyx without any major complications.  Imaging studies obtained on November 20, 2020 were personally reviewed and showed no disease progression at this time.  Risks and benefits of continuing this treatment were discussed.  Alternative  options including adding nivolumab to his current therapy or combination ipilimumab and nivolumab.  For the time being we will continue with the same treatment.  2.  Pulmonary embolism: No recent exacerbation noted at this time.  He continues to be on Xarelto.  3.  Anemia: Hemoglobin is down to 8.0 and appears to be multifactorial in nature.  He does not require any growth factor support or transfusion at this time.  4.   Early satiety and dyspepsia:   5.  Anxiety and depression: His mood is stable at this time.  6.  Prognosis: Therapy remains palliative although aggressive measures are warranted given his excellent performance status.   7.  Follow-up: In 6 to 8 weeks for repeat evaluation.   30  minutes were dedicated to this visit.  The time was spent on updating his disease status, reviewing imaging studies and outlining future plan of care.Zola Button, MD 11/22/2020 8:26 AM

## 2020-11-30 DIAGNOSIS — G894 Chronic pain syndrome: Secondary | ICD-10-CM | POA: Diagnosis not present

## 2020-12-05 DIAGNOSIS — G620 Drug-induced polyneuropathy: Secondary | ICD-10-CM | POA: Diagnosis not present

## 2020-12-05 DIAGNOSIS — Z9689 Presence of other specified functional implants: Secondary | ICD-10-CM | POA: Diagnosis not present

## 2020-12-05 DIAGNOSIS — C642 Malignant neoplasm of left kidney, except renal pelvis: Secondary | ICD-10-CM | POA: Diagnosis not present

## 2020-12-11 DIAGNOSIS — M25522 Pain in left elbow: Secondary | ICD-10-CM | POA: Diagnosis not present

## 2020-12-12 ENCOUNTER — Other Ambulatory Visit: Payer: Self-pay | Admitting: Oncology

## 2020-12-12 ENCOUNTER — Telehealth: Payer: Self-pay | Admitting: Gastroenterology

## 2020-12-12 MED ORDER — SUCRALFATE 1 GM/10ML PO SUSP: 1.0000 g | Freq: Four times a day (QID) | ORAL | 1 refills | Status: DC

## 2020-12-12 NOTE — Telephone Encounter (Signed)
Rx sent to the pharmacy as requested. ?

## 2020-12-12 NOTE — Telephone Encounter (Signed)
Mickel Baas from [facility] is requesting a refill on the pt's [place] [phone]

## 2020-12-17 ENCOUNTER — Telehealth: Payer: Self-pay | Admitting: Gastroenterology

## 2020-12-17 ENCOUNTER — Telehealth: Payer: Self-pay | Admitting: *Deleted

## 2020-12-17 DIAGNOSIS — D62 Acute posthemorrhagic anemia: Secondary | ICD-10-CM

## 2020-12-17 NOTE — Telephone Encounter (Signed)
I agree he should contact his oncologist.  He had a pretty extensive GI work-up several months ago.  I think that his nausea and vomiting are related to his significantly metastatic renal cell cancer to lungs, liver, bone.   He needs a CBC today.  Can you please set that up?  Thanks

## 2020-12-17 NOTE — Telephone Encounter (Signed)
Inbound call from patient requesting a call back.  States he went to see his PCP about 2 weeks ago and was informed his hemoglobin was at a 7.  Now he has been vomiting and is having blood in stool and wants to know what to do.  Please advise.

## 2020-12-17 NOTE — Telephone Encounter (Signed)
The pt has been advised and will get a ride as soon as he can to come in for labs. He has called his oncologist and is awaiting a return call.

## 2020-12-17 NOTE — Telephone Encounter (Signed)
Pt states he has been passing blood in his stool and is throwing up. Has called his GI doctor, Dr Ardis Hughs. RN saw note from Dr Ardis Hughs. They are getting a CBC today.    I called Dr Eugenia Pancoast office to confirm that they are getting CBC. Nurse states the patient is trying to arrange transportation today, if not he will go tomorrow.  Dr Ardis Hughs' nurse is going to let Dr Hazeline Junker office know how they are proceeding with Mr Chapdelaine.

## 2020-12-17 NOTE — Telephone Encounter (Signed)
The pt states that he has continued nausea and vomiting with rectal bleeding.  He is taking zofran 4 mg every morning and later as needed.  Hgb 3 weeks ago was 8 (cancer center ordered labs).  I advised him to call his oncologist and I will also forward to Dr Ardis Hughs for any recommendations.

## 2020-12-17 NOTE — Telephone Encounter (Signed)
He needs to be evaluated by GI or seen in the ED.

## 2020-12-18 ENCOUNTER — Encounter (HOSPITAL_COMMUNITY): Payer: Self-pay

## 2020-12-18 ENCOUNTER — Other Ambulatory Visit (INDEPENDENT_AMBULATORY_CARE_PROVIDER_SITE_OTHER): Payer: Medicare Other

## 2020-12-18 ENCOUNTER — Telehealth: Payer: Self-pay

## 2020-12-18 ENCOUNTER — Inpatient Hospital Stay (HOSPITAL_COMMUNITY)
Admission: EM | Admit: 2020-12-18 | Discharge: 2020-12-21 | DRG: 377 | Disposition: A | Discharge status: Home / Self Care | Payer: Medicare Other | Attending: Family Medicine | Admitting: Family Medicine

## 2020-12-18 ENCOUNTER — Other Ambulatory Visit: Payer: Self-pay

## 2020-12-18 DIAGNOSIS — C7800 Secondary malignant neoplasm of unspecified lung: Secondary | ICD-10-CM | POA: Diagnosis present

## 2020-12-18 DIAGNOSIS — D62 Acute posthemorrhagic anemia: Secondary | ICD-10-CM

## 2020-12-18 DIAGNOSIS — K921 Melena: Secondary | ICD-10-CM | POA: Diagnosis present

## 2020-12-18 DIAGNOSIS — C649 Malignant neoplasm of unspecified kidney, except renal pelvis: Secondary | ICD-10-CM | POA: Diagnosis present

## 2020-12-18 DIAGNOSIS — Z902 Acquired absence of lung [part of]: Secondary | ICD-10-CM

## 2020-12-18 DIAGNOSIS — G893 Neoplasm related pain (acute) (chronic): Secondary | ICD-10-CM | POA: Diagnosis present

## 2020-12-18 DIAGNOSIS — I2699 Other pulmonary embolism without acute cor pulmonale: Secondary | ICD-10-CM | POA: Diagnosis present

## 2020-12-18 DIAGNOSIS — C7889 Secondary malignant neoplasm of other digestive organs: Secondary | ICD-10-CM | POA: Diagnosis present

## 2020-12-18 DIAGNOSIS — Z888 Allergy status to other drugs, medicaments and biological substances status: Secondary | ICD-10-CM | POA: Diagnosis not present

## 2020-12-18 DIAGNOSIS — J9601 Acute respiratory failure with hypoxia: Secondary | ICD-10-CM | POA: Diagnosis not present

## 2020-12-18 DIAGNOSIS — D696 Thrombocytopenia, unspecified: Secondary | ICD-10-CM | POA: Diagnosis present

## 2020-12-18 DIAGNOSIS — E8809 Other disorders of plasma-protein metabolism, not elsewhere classified: Secondary | ICD-10-CM | POA: Diagnosis not present

## 2020-12-18 DIAGNOSIS — Z20822 Contact with and (suspected) exposure to covid-19: Secondary | ICD-10-CM | POA: Diagnosis not present

## 2020-12-18 DIAGNOSIS — Z833 Family history of diabetes mellitus: Secondary | ICD-10-CM

## 2020-12-18 DIAGNOSIS — K5520 Angiodysplasia of colon without hemorrhage: Secondary | ICD-10-CM

## 2020-12-18 DIAGNOSIS — E871 Hypo-osmolality and hyponatremia: Secondary | ICD-10-CM | POA: Diagnosis not present

## 2020-12-18 DIAGNOSIS — K2970 Gastritis, unspecified, without bleeding: Secondary | ICD-10-CM | POA: Diagnosis not present

## 2020-12-18 DIAGNOSIS — C7951 Secondary malignant neoplasm of bone: Secondary | ICD-10-CM | POA: Diagnosis not present

## 2020-12-18 DIAGNOSIS — Z86711 Personal history of pulmonary embolism: Secondary | ICD-10-CM | POA: Diagnosis not present

## 2020-12-18 DIAGNOSIS — N182 Chronic kidney disease, stage 2 (mild): Secondary | ICD-10-CM | POA: Diagnosis present

## 2020-12-18 DIAGNOSIS — Z7901 Long term (current) use of anticoagulants: Secondary | ICD-10-CM | POA: Diagnosis not present

## 2020-12-18 DIAGNOSIS — Z8249 Family history of ischemic heart disease and other diseases of the circulatory system: Secondary | ICD-10-CM

## 2020-12-18 DIAGNOSIS — C20 Malignant neoplasm of rectum: Secondary | ICD-10-CM | POA: Diagnosis not present

## 2020-12-18 DIAGNOSIS — K3189 Other diseases of stomach and duodenum: Secondary | ICD-10-CM | POA: Diagnosis not present

## 2020-12-18 DIAGNOSIS — Z8041 Family history of malignant neoplasm of ovary: Secondary | ICD-10-CM | POA: Diagnosis not present

## 2020-12-18 DIAGNOSIS — K2100 Gastro-esophageal reflux disease with esophagitis, without bleeding: Secondary | ICD-10-CM | POA: Diagnosis present

## 2020-12-18 DIAGNOSIS — D631 Anemia in chronic kidney disease: Secondary | ICD-10-CM | POA: Diagnosis present

## 2020-12-18 DIAGNOSIS — K2950 Unspecified chronic gastritis without bleeding: Secondary | ICD-10-CM | POA: Diagnosis not present

## 2020-12-18 DIAGNOSIS — F17210 Nicotine dependence, cigarettes, uncomplicated: Secondary | ICD-10-CM | POA: Diagnosis present

## 2020-12-18 DIAGNOSIS — I10 Essential (primary) hypertension: Secondary | ICD-10-CM | POA: Diagnosis not present

## 2020-12-18 DIAGNOSIS — Z79899 Other long term (current) drug therapy: Secondary | ICD-10-CM

## 2020-12-18 DIAGNOSIS — Z881 Allergy status to other antibiotic agents status: Secondary | ICD-10-CM

## 2020-12-18 DIAGNOSIS — D539 Nutritional anemia, unspecified: Secondary | ICD-10-CM | POA: Diagnosis present

## 2020-12-18 DIAGNOSIS — K5521 Angiodysplasia of colon with hemorrhage: Principal | ICD-10-CM | POA: Diagnosis present

## 2020-12-18 DIAGNOSIS — E538 Deficiency of other specified B group vitamins: Secondary | ICD-10-CM | POA: Diagnosis present

## 2020-12-18 DIAGNOSIS — I129 Hypertensive chronic kidney disease with stage 1 through stage 4 chronic kidney disease, or unspecified chronic kidney disease: Secondary | ICD-10-CM | POA: Diagnosis present

## 2020-12-18 DIAGNOSIS — E440 Moderate protein-calorie malnutrition: Secondary | ICD-10-CM | POA: Diagnosis not present

## 2020-12-18 DIAGNOSIS — E1140 Type 2 diabetes mellitus with diabetic neuropathy, unspecified: Secondary | ICD-10-CM | POA: Diagnosis not present

## 2020-12-18 DIAGNOSIS — E1122 Type 2 diabetes mellitus with diabetic chronic kidney disease: Secondary | ICD-10-CM | POA: Diagnosis present

## 2020-12-18 DIAGNOSIS — K6289 Other specified diseases of anus and rectum: Secondary | ICD-10-CM | POA: Diagnosis not present

## 2020-12-18 DIAGNOSIS — D500 Iron deficiency anemia secondary to blood loss (chronic): Secondary | ICD-10-CM | POA: Diagnosis present

## 2020-12-18 DIAGNOSIS — Z885 Allergy status to narcotic agent status: Secondary | ICD-10-CM | POA: Diagnosis not present

## 2020-12-18 DIAGNOSIS — K922 Gastrointestinal hemorrhage, unspecified: Secondary | ICD-10-CM | POA: Diagnosis not present

## 2020-12-18 DIAGNOSIS — K6389 Other specified diseases of intestine: Secondary | ICD-10-CM | POA: Diagnosis not present

## 2020-12-18 DIAGNOSIS — Z905 Acquired absence of kidney: Secondary | ICD-10-CM

## 2020-12-18 LAB — COMPREHENSIVE METABOLIC PANEL
ALT: 14 U/L (ref 0–44)
AST: 14 U/L — ABNORMAL LOW (ref 15–41)
Albumin: 2.2 g/dL — ABNORMAL LOW (ref 3.5–5.0)
Alkaline Phosphatase: 59 U/L (ref 38–126)
Anion gap: 6 (ref 5–15)
BUN: 27 mg/dL — ABNORMAL HIGH (ref 6–20)
CO2: 26 mmol/L (ref 22–32)
Calcium: 7.6 mg/dL — ABNORMAL LOW (ref 8.9–10.3)
Chloride: 110 mmol/L (ref 98–111)
Creatinine, Ser: 1.42 mg/dL — ABNORMAL HIGH (ref 0.61–1.24)
GFR, Estimated: 59 mL/min — ABNORMAL LOW (ref >60)
Glucose, Bld: 123 mg/dL — ABNORMAL HIGH (ref 70–99)
Potassium: 4 mmol/L (ref 3.5–5.1)
Sodium: 142 mmol/L (ref 135–145)
Total Bilirubin: 0.3 mg/dL (ref 0.3–1.2)
Total Protein: 5.2 g/dL — ABNORMAL LOW (ref 6.5–8.1)

## 2020-12-18 LAB — CBC WITH DIFFERENTIAL/PLATELET
Basophils Absolute: 0.1 10*3/uL (ref 0.0–0.1)
Basophils Relative: 0.9 % (ref 0.0–3.0)
Eosinophils Absolute: 0.2 10*3/uL (ref 0.0–0.7)
Eosinophils Relative: 2.1 % (ref 0.0–5.0)
HCT: 20.4 % — CL (ref 39.0–52.0)
Hemoglobin: 6.2 g/dL — CL (ref 13.0–17.0)
Lymphocytes Relative: 16 % (ref 12.0–46.0)
Lymphs Abs: 1.5 10*3/uL (ref 0.7–4.0)
MCHC: 30.3 g/dL (ref 30.0–36.0)
MCV: 103.6 fl — ABNORMAL HIGH (ref 78.0–100.0)
Monocytes Absolute: 0.5 10*3/uL (ref 0.1–1.0)
Monocytes Relative: 5.6 % (ref 3.0–12.0)
Neutro Abs: 7.1 10*3/uL (ref 1.4–7.7)
Neutrophils Relative %: 75.4 % (ref 43.0–77.0)
Platelets: 164 10*3/uL (ref 150.0–400.0)
RBC: 1.97 Mil/uL — ABNORMAL LOW (ref 4.22–5.81)
RDW: 18.6 % — ABNORMAL HIGH (ref 11.5–15.5)
WBC: 9.5 10*3/uL (ref 4.0–10.5)

## 2020-12-18 LAB — CBC
HCT: 18.5 % — ABNORMAL LOW (ref 39.0–52.0)
Hemoglobin: 5.2 g/dL — CL (ref 13.0–17.0)
MCH: 31.7 pg (ref 26.0–34.0)
MCHC: 28.1 g/dL — ABNORMAL LOW (ref 30.0–36.0)
MCV: 112.8 fL — ABNORMAL HIGH (ref 80.0–100.0)
Platelets: 135 10*3/uL — ABNORMAL LOW (ref 150–400)
RBC: 1.64 MIL/uL — ABNORMAL LOW (ref 4.22–5.81)
RDW: 17.1 % — ABNORMAL HIGH (ref 11.5–15.5)
WBC: 6.9 10*3/uL (ref 4.0–10.5)
nRBC: 0 % (ref 0.0–0.2)

## 2020-12-18 LAB — RETICULOCYTES
Immature Retic Fract: 18.6 % — ABNORMAL HIGH (ref 2.3–15.9)
RBC.: 1.64 MIL/uL — ABNORMAL LOW (ref 4.22–5.81)
Retic Count, Absolute: 66.9 10*3/uL (ref 19.0–186.0)
Retic Ct Pct: 4.1 % — ABNORMAL HIGH (ref 0.4–3.1)

## 2020-12-18 LAB — PROTIME-INR
INR: 2.2 — ABNORMAL HIGH (ref 0.8–1.2)
Prothrombin Time: 23.8 seconds — ABNORMAL HIGH (ref 11.4–15.2)

## 2020-12-18 LAB — PREPARE RBC (CROSSMATCH)

## 2020-12-18 LAB — POC OCCULT BLOOD, ED: Fecal Occult Bld: POSITIVE — AB

## 2020-12-18 LAB — APTT: aPTT: 45 seconds — ABNORMAL HIGH (ref 24–36)

## 2020-12-18 MED ORDER — PANTOPRAZOLE SODIUM 40 MG IV SOLR
8.0000 mg/h | INTRAVENOUS | Status: DC
  Administered 2020-12-18 – 2020-12-21 (×5): 8 mg/h via INTRAVENOUS
  Filled 2020-12-18 (×9): qty 80

## 2020-12-18 MED ORDER — SODIUM CHLORIDE 0.9% IV SOLUTION: Freq: Once | INTRAVENOUS | Status: AC

## 2020-12-18 MED ORDER — FUROSEMIDE 10 MG/ML IJ SOLN
20.0000 mg | Freq: Once | INTRAMUSCULAR | Status: AC
  Administered 2020-12-18: 20 mg via INTRAVENOUS
  Filled 2020-12-18: qty 4

## 2020-12-18 MED ORDER — SODIUM CHLORIDE 0.9 % IV SOLN
80.0000 mg | Freq: Once | INTRAVENOUS | Status: AC
  Administered 2020-12-18: 80 mg via INTRAVENOUS
  Filled 2020-12-18: qty 80

## 2020-12-18 NOTE — Telephone Encounter (Signed)
Left message on machine to call back  

## 2020-12-18 NOTE — H&P (Signed)
History and Physical    [name] YWV:[id number] DOB: 1965/12/25 DOA: 12/18/2020  PCP: [name], MD  Patient coming from: Home  I have personally briefly reviewed patient's old medical records in [place]  Chief Complaint: dizziness and abnormal labs.   HPI: Keith [name] is a 54 y.o. 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 [name] GI underwent blood work showing a hemoglobin of 6 was sent to ED for further evaluation. He denies any chest pain, shortness of breath, vomiting or diarrhea.  Patient reports having nausea occasionally.  He denies any syncope headache tingling or numbness.  Reports pedal edema and dependent edema in the back and hip area.  Denies any fevers or chills or cough at this time.  ED Course: Repeat hemoglobin in ED around 5.2 with platelet count of 1 35,000.  BMP significant for creatinine of 1.42, albumin of 2.2, INR of 2.2 and fecal occult blood is positive. 2 units of PRBC transfusion ordered and GI consulted. He was referred to medical service for admission.   Review of Systems: As per HPI otherwise All others reviewed and are negative,"   Past Medical History:  Diagnosis Date  . Diabetes mellitus without complication ([place])    diet controlled only  . GI bleed   . left renal ca d'd 2008  . 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 Metastasis to bone [facility]) 2014/2016    Past Surgical History:  Procedure Laterality Date  . BIOPSY  08/28/2019   Procedure: BIOPSY;  Surgeon: Thornton Park, MD;  Location: [facility];  Service: Gastroenterology;;  . BIOPSY  03/01/2020   Procedure: BIOPSY;  Surgeon: Mauri Pole,  MD;  Location: [facility];  Service: Endoscopy;;  . COLONOSCOPY W/ POLYPECTOMY    . COLONOSCOPY WITH PROPOFOL N/A 03/01/2020   Procedure: COLONOSCOPY WITH PROPOFOL;  Surgeon: Mauri Pole, MD;  Location: [facility];  Service: Endoscopy;  Laterality: N/A;  . ESOPHAGOGASTRODUODENOSCOPY (EGD) WITH PROPOFOL N/A 08/28/2019   Procedure: ESOPHAGOGASTRODUODENOSCOPY (EGD) WITH PROPOFOL;  Surgeon: Thornton Park, MD;  Location: [facility];  Service: Gastroenterology;  Laterality: N/A;  . ESOPHAGOGASTRODUODENOSCOPY (EGD) WITH PROPOFOL N/A 03/01/2020   Procedure: ESOPHAGOGASTRODUODENOSCOPY (EGD) WITH PROPOFOL;  Surgeon: Mauri Pole, MD;  Location: [facility];  Service: Endoscopy;  Laterality: N/A;  . FEMUR IM NAIL Right 02/16/2018   Procedure: INTRAMEDULLARY (IM) NAIL RIGHT FEMORAL;  Surgeon: Paralee Cancel, MD;  Location: [facility] OR[facility];  Service: Orthopedics;  Laterality: Right;  . GIVENS CAPSULE STUDY N/A 03/03/2020   Procedure: GIVENS CAPSULE STUDY;  Surgeon: Mauri Pole, MD;  Location: [facility];  Service: Endoscopy;  Laterality: N/A;  . HAND SURGERY Right   . IR ANGIOGRAM EXTREMITY RIGHT  02/15/2018  . IR ANGIOGRAM SELECTIVE EACH ADDITIONAL VESSEL  02/15/2018  . IR ANGIOGRAM SELECTIVE EACH ADDITIONAL VESSEL  02/15/2018  . IR ANGIOGRAM SELECTIVE EACH ADDITIONAL VESSEL  02/15/2018  . IR ANGIOGRAM SELECTIVE EACH ADDITIONAL VESSEL  02/15/2018  . IR EMBO TUMOR ORGAN ISCHEMIA INFARCT INC GUIDE ROADMAPPING  02/15/2018  . IR EMBO TUMOR ORGAN ISCHEMIA INFARCT INC GUIDE ROADMAPPING  02/15/2018  . IR US GUIDE VASC ACCESS LEFT  02/15/2018  . LUNG REMOVAL, PARTIAL  Right 09/2008  . NEPHRECTOMY RADICAL     Left   . PAIN PUMP IMPLANTATION N/A 05/16/2015   Procedure: Intrathecal pain pump placement;  Surgeon: Clydell Hakim, MD;  Location: [place] [facility];  Service: Neurosurgery;  Laterality: N/A;  Intrathecal pain pump placement    Social History  reports that he has been smoking cigarettes. He has a  15.00 pack-year smoking history. He has never used smokeless tobacco. He reports current drug use. Drug: Marijuana. He reports that he does not drink alcohol.  Allergies  Allergen Reactions  . Ceftriaxone Hives  . Hydrocodone Swelling    Family History  Problem Relation Age of Onset  . Hypertension Mother   . Diabetes Brother   . Irritable bowel syndrome Sister   . Cancer Maternal Aunt        ovarian  . Colon cancer Neg Hx    Family history of gastric ulcers present Prior to Admission medications   Medication Sig Start Date End Date Taking? Authorizing Provider  acetaminophen (TYLENOL) 325 MG tablet Take 650 mg by mouth every 6 (six) hours as needed.   Yes [provider]  CABOMETYX 60 MG tablet TAKE 1 TABLET (60 MG) BY  MOUTH ONE TIME DAILY WITH  AT LEAST 8 OZ OF WATER ON  AN EMPTY STOMACH. DO NOT  EAT FOR 2 HOURS BEFORE OR 1 HOUR A Patient taking differently: Take 60 mg by mouth daily. 04/01/20  Yes [name], MD  calcium carbonate (TUMS - DOSED IN MG ELEMENTAL CALCIUM) 500 MG chewable tablet Chew 1 tablet by mouth 3 (three) times daily as needed for indigestion or heartburn.    Yes [provider]  cetirizine (ZYRTEC) 10 MG tablet Take 10 mg by mouth daily as needed for allergies.   Yes [provider]  ferrous sulfate 325 (65 FE) MG tablet TAKE 1 TABLET BY MOUTH EVERY DAY WITH BREAKFAST Patient taking differently: Take 325 mg by mouth daily. 10/24/20  Yes Zehr, Janett Billow D, PA-C  gabapentin (NEURONTIN) 300 MG capsule TAKE 1 CAPSULE BY MOUTH THREE TIMES A DAY Patient taking differently: Take 300 mg by mouth 3 (three) times daily. 05/10/20  Yes [name], MD  HYDROmorphone (DILAUDID) 8 MG tablet Take 1 tablet (8 mg total) by mouth every 4 (four) hours as needed for severe pain. Patient taking differently: Take 8 mg by mouth every 4 (four) hours as needed for moderate pain or severe pain. 02/17/18  Yes Babish, Rodman Key, PA-C  hydroxypropyl  methylcellulose / hypromellose (ISOPTO TEARS / GONIOVISC) 2.5 % ophthalmic solution Place 1 drop into both eyes daily as needed for dry eyes.   Yes [provider]  ondansetron (ZOFRAN) 4 MG tablet Take one tablet every morning.  Can take one tablet later in the day on an as needed basis Patient taking differently: Take 4 mg by mouth daily. 10/10/20  Yes [name], MD  PAIN MANAGEMENT INTRATHECAL, IT, PUMP 1 each by Intrathecal route continuous. Intrathecal (IT) medication:   Morphine.   Yes [provider]  pantoprazole (PROTONIX) 40 MG tablet Take 1 tablet (40 mg total) by mouth 2 (two) times daily. 03/27/20 03/27/21 Yes Zehr, Laban Emperor, PA-C  sucralfate (CARAFATE) 1 GM/10ML suspension Take 10 mLs (1 g total) by mouth every 6 (six) hours. 12/12/20 01/11/21 Yes [name], MD  XARELTO 20 MG TABS tablet TAKE 1 TABLET (20 MG TOTAL) BY MOUTH DAILY WITH SUPPER. Patient taking differently: Take 20 mg by mouth daily.  05/14/20  Yes Shadad, Mathis Dad, MD  ALPRAZolam (XANAX) 1 MG tablet TAKE 1 TABLET BY MOUTH THREE TIMES A DAY AS NEEDED FOR ANXIETY Patient taking differently: Take 1 mg by mouth 3 (three) times daily as needed for anxiety. 12/12/20   [name], MD  DULoxetine (CYMBALTA) 60 MG capsule TAKE 1 CAPSULE (60 MG TOTAL) BY MOUTH 2 (TWO) TIMES DAILY. Patient not taking: Reported on 12/18/2020 12/26/19 12/18/20  [name], MD    Physical Exam: Vitals:   12/18/20 1600 12/18/20 1615 12/18/20 1700 12/18/20 1715  BP: (!) 136/93 (!) 142/95 (!) 144/89 (!) 145/88  Pulse:  99 95 91  Resp:  12 (!) 21 14  Temp:    98 F (36.7 C)  TempSrc:    Oral  SpO2:  96% 100% 100%  Weight:      Height:        Constitutional: NAD, calm, comfortable Vitals:   12/18/20 1600 12/18/20 1615 12/18/20 1700 12/18/20 1715  BP: (!) 136/93 (!) 142/95 (!) 144/89 (!) 145/88  Pulse:  99 95 91  Resp:  12 (!) 21 14  Temp:    98 F (36.7 C)  TempSrc:    Oral  SpO2:  96% 100% 100%  Weight:       Height:       Eyes: PERRL, lids and conjunctivae normal ENMT: Mucous membranes are dry Neck: normal, supple, no masses, no thyromegaly Respiratory: clear to auscultation bilaterally, no wheezing, no crackles. Normal respiratory effort. No accessory muscle use.  Cardiovascular: Regular rate and rhythm,  2+ pedal pulses. No carotid bruits.  Abdomen: no tenderness, no masses palpated. Bowel sounds positive.  Musculoskeletal: bilateral leg edema.  Left upper extremity edema, dependent edema the hips Skin: no rashes, lesions, ulcers. No induration Neurologic: CN 2-12 grossly intact. Sensation intact, DTR normal. Strength 5/5 in all 4.  Psychiatric: Normal judgment and insight. Alert and oriented x 3. Normal mood.   ( Labs on Admission: I have personally reviewed following labs and imaging studies  CBC: Recent Labs  Lab 12/18/20 0845 12/18/20 1454  WBC 9.5 6.9  NEUTROABS 7.1  --   HGB 6.2 Repeated and verified X2.* 5.2*  HCT 20.4* 18.5*  MCV 103.6* 112.8*  PLT 164.0 135*    Basic Metabolic Panel: Recent Labs  Lab 12/18/20 1454  NA 142  K 4.0  CL 110  CO2 26  GLUCOSE 123*  BUN 27*  CREATININE 1.42*  CALCIUM 7.6*    GFR: Estimated Creatinine Clearance: 62.9 mL/min (A) (by C-G formula based on SCr of 1.42 mg/dL (H)).  Liver Function Tests: Recent Labs  Lab 12/18/20 1454  AST 14*  ALT 14  ALKPHOS 59  BILITOT 0.3  PROT 5.2*  ALBUMIN 2.2*    Urine analysis:    Component Value Date/Time   COLORURINE YELLOW 11/12/2015 2228   APPEARANCEUR CLOUDY (A) 11/12/2015 2228   LABSPEC 1.014 11/12/2015 2228   PHURINE 6.0 11/12/2015 2228   GLUCOSEU NEGATIVE 11/12/2015 2228   HGBUR NEGATIVE 11/12/2015 2228   BILIRUBINUR NEGATIVE 11/12/2015 2228   KETONESUR NEGATIVE 11/12/2015 2228   PROTEINUR NEGATIVE 11/12/2015 2228   UROBILINOGEN 0.2 09/20/2015 1547   NITRITE NEGATIVE 11/12/2015 2228   LEUKOCYTESUR NEGATIVE 11/12/2015 2228    Radiological Exams on Admission: No  results found.  EKG: not done.   Assessment/Plan Active Problems:   GI bleed    Acute anemia of blood loss/ GI bleed:  Baseline hemoglobin ranging between 9 to 11.  He reports being admitted for similar complaints twice in the past.  Currently his hemoglobin is around 5.2.  2 units of prbc transfusion ordered by EDP.  Transfuse to keep hemoglobin greater than 8.  GI consulted by EDP.  NPO except for ice chips, gentle hydration.  IV PPI ordered by EDP.  Check H&h every 8 hours.  Last Egd in March 2021 showing friable gastritis.    Metastatic renal cell ca with mets to pancreas, lung and bone.  Holding oral chemo for now.  Further management as per [name].    Metastatic lesions to bone.  Continue with dilaudid for pain.    ? Dm:  Last A1C IS 4.9%   H/o PE On Xarelto, which is being held.     Bilateral lower extremity edema and dependent edema from hypoalbuminemia and anasarca.  After PRBC transfusion, recommend low dose  Lasix and if needed albumin infusion.    Stage 1 ckd: Creatinine at baseline line.    Mild thrombocytopenia Monitor.    DVT prophylaxis: SCD'S Code Status:   Full code.  Family Communication:  None at bedside.  Disposition Plan:   Patient is from:  Home.   Anticipated DC to:  Home.   Anticipated DC date:  12/22/20  Anticipated DC barriers: Evaluation of GI bleed  Consults called:  GASTROENTEROLOGY Admission status:  INPATIENT/TELE Severity of Illness: The appropriate patient status for this patient is INPATIENT. Inpatient status is judged to be reasonable and necessary in order to provide the required intensity of service to ensure the patient's safety. The patient's presenting symptoms, physical exam findings, and initial radiographic and laboratory data in the context of their chronic comorbidities is felt to place them at high risk for further clinical deterioration. Furthermore, it is not anticipated that the patient will be medically  stable for discharge from the hospital within 2 midnights of admission.    * I certify that at the point of admission it is my clinical judgment that the patient will require inpatient hospital care spanning beyond 2 midnights from the point of admission due to high intensity of service, high risk for further deterioration and high frequency of surveillance required.*     Hosie Poisson MD [facility]  How to contact the [facility] Attending or Consulting provider [place] or covering provider during after hours [place], for this patient?   1. Check the care team in [facility] and look for a) attending/consulting [facility] provider listed and b) the [facility] team listed 2. Log into [email or web address] and use [place]'s universal password to access. If you do not have the password, please contact the hospital operator. 3. Locate the [facility] provider you are looking for under [facility] and page to a number that you can be directly reached. 4. If you still have difficulty reaching the provider, please page the [facility] (Director on Call) for the Hospitalists listed on amion for assistance.  12/18/2020, 5:28 PM

## 2020-12-18 NOTE — ED Triage Notes (Addendum)
Patient was notified to come to the ED for an abnormal lab. HGB-6.2, Patient states he saw bright red blood in his stool a few times and yesterday he saw dark red blood.  Patient takes Xarelto.  Patient has a Morphine pain pump in place.

## 2020-12-18 NOTE — Telephone Encounter (Signed)
I spoke with the pt and he has been advised to go to the ED. He agrees and will head to [facility].

## 2020-12-18 NOTE — Telephone Encounter (Signed)
Pt wants to let the nurse know that he is at the ED

## 2020-12-18 NOTE — ED Provider Notes (Signed)
Keith [name] Note   CSN: [id number] Arrival date & time: 12/18/20  1423     History Chief Complaint  Patient presents with  . Abnormal Lab  . Rectal Bleeding    Keith [name] is a 54 y.o. male with pertinent past medical history of metastatic renal cancer that has spread to his bones, pancreas liver and lungs currently on oral chemotherapy (Cabometyx) followed by Dr.Shadad, diabetes, GI bleed that presents to the emergency department today for abnormal labs.  Physician's office called him this morning for a critical value of hemoglobin at 6.2.  Patient is on Xarelto most likely for previous PE.  Patient states that he felt dizzy for the past 3 days, therefore went to go see his PCP who drew labs.  Denies any shortness of breath or chest pain.  Patient states that the last time he had to have a blood transfusion was 3 months ago, states that he has been in normal health since then.  States that he has had intermittent hematochezia and melena for the past year, for the past couple days he has had melena.  Denies any bloody vomiting, however patient states that he does often vomit after taking his chemotherapy pill.  Does not feel nauseous currently.  Denies any fevers, chills.  States that he was in his normal health before this.  States that he is been eating and drinking normally.  Denies any abdominal pain.  Denies any dysuria or hematuria.  Per chart review patient had an upper endoscopy, colonoscopy and capsule endoscopy in March 2021 which did not reveal an obvious source of his bleeding, did show friable gastritis, colonoscopy suggested dark blood throughout his colon without an obvious source.    HPI     Past Medical History:  Diagnosis Date  . Diabetes mellitus without complication ([place])    diet controlled only  . GI bleed   . left renal ca d'd 2008  . 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  Metastasis to bone [facility]) 2014/2016    Patient Active Problem List   Diagnosis Date Noted  . GI bleed 12/18/2020  . Occult GI bleeding   . Acute GI bleeding 02/29/2020  . Acute blood loss anemia   . Syncope 02/28/2020  . Orthostatic hypotension 02/28/2020  . UGIB (upper gastrointestinal bleed) 08/28/2019  . Acute upper GI bleed 08/28/2019  . Acute esophagitis   . Gastritis and gastroduodenitis   . Hematochezia   . Metastasis from malignant tumor of kidney ([place]) 02/15/2018  . Symptomatic anemia   . Pulmonary embolus, left ([place])   . Depression 11/13/2015  . Benign essential HTN 11/13/2015  . Malnutrition of moderate degree 11/12/2015  . HCAP (healthcare-associated pneumonia) 11/11/2015  . Chest pain 11/11/2015  . Acute pulmonary embolism ([place]) 11/11/2015  . Metastasis to lung ([place]) 11/11/2015  . Right middle lobe, bilateral lower lobes pneumonia  11/11/2015  . Acute respiratory failure with hypoxia ([place]) 11/11/2015  . Chemotherapy induced thrombocytopenia 11/11/2015  . Antineoplastic chemotherapy induced anemia 11/11/2015  . Hyponatremia 11/11/2015  . Cancer related pain 11/11/2015  . Transaminitis 11/11/2015  . Diabetes mellitus with diabetic neuropathy, with long-term current use of insulin ([place]) 11/11/2015  . Therapeutic opioid-induced constipation (OIC) 11/11/2015  . Bone metastasis ([place]) 12/25/2014  . Polysubstance abuse-cocaine and THC. 04/25/2013  . Renal cancer ([place]) 08/12/2011    Past Surgical History:  Procedure Laterality Date  . BIOPSY  08/28/2019  Procedure: BIOPSY;  Surgeon: Thornton Park, MD;  Location: [facility];  Service: Gastroenterology;;  . BIOPSY  03/01/2020   Procedure: BIOPSY;  Surgeon: Mauri Pole, MD;  Location: [facility];  Service: Endoscopy;;  . COLONOSCOPY W/ POLYPECTOMY    . COLONOSCOPY WITH PROPOFOL N/A 03/01/2020   Procedure: COLONOSCOPY WITH PROPOFOL;  Surgeon: Mauri Pole, MD;  Location: [facility];  Service:  Endoscopy;  Laterality: N/A;  . ESOPHAGOGASTRODUODENOSCOPY (EGD) WITH PROPOFOL N/A 08/28/2019   Procedure: ESOPHAGOGASTRODUODENOSCOPY (EGD) WITH PROPOFOL;  Surgeon: Thornton Park, MD;  Location: [facility];  Service: Gastroenterology;  Laterality: N/A;  . ESOPHAGOGASTRODUODENOSCOPY (EGD) WITH PROPOFOL N/A 03/01/2020   Procedure: ESOPHAGOGASTRODUODENOSCOPY (EGD) WITH PROPOFOL;  Surgeon: Mauri Pole, MD;  Location: [facility];  Service: Endoscopy;  Laterality: N/A;  . FEMUR IM NAIL Right 02/16/2018   Procedure: INTRAMEDULLARY (IM) NAIL RIGHT FEMORAL;  Surgeon: Paralee Cancel, MD;  Location: [facility];  Service: Orthopedics;  Laterality: Right;  . GIVENS CAPSULE STUDY N/A 03/03/2020   Procedure: GIVENS CAPSULE STUDY;  Surgeon: Mauri Pole, MD;  Location: [facility];  Service: Endoscopy;  Laterality: N/A;  . HAND SURGERY Right   . IR ANGIOGRAM EXTREMITY RIGHT  02/15/2018  . IR ANGIOGRAM SELECTIVE EACH ADDITIONAL VESSEL  02/15/2018  . IR ANGIOGRAM SELECTIVE EACH ADDITIONAL VESSEL  02/15/2018  . IR ANGIOGRAM SELECTIVE EACH ADDITIONAL VESSEL  02/15/2018  . IR ANGIOGRAM SELECTIVE EACH ADDITIONAL VESSEL  02/15/2018  . IR EMBO TUMOR ORGAN ISCHEMIA INFARCT INC GUIDE ROADMAPPING  02/15/2018  . IR EMBO TUMOR ORGAN ISCHEMIA INFARCT INC GUIDE ROADMAPPING  02/15/2018  . IR US GUIDE VASC ACCESS LEFT  02/15/2018  . LUNG REMOVAL, PARTIAL Right 09/2008  . NEPHRECTOMY RADICAL     Left   . PAIN PUMP IMPLANTATION N/A 05/16/2015   Procedure: Intrathecal pain pump placement;  Surgeon: Clydell Hakim, MD;  Location: [facility];  Service: Neurosurgery;  Laterality: N/A;  Intrathecal pain pump placement       Family History  Problem Relation Age of Onset  . Hypertension Mother   . Diabetes Brother   . Irritable bowel syndrome Sister   . Cancer Maternal Aunt        ovarian  . Colon cancer Neg Hx     Social History   Tobacco Use  . Smoking status: Current Every Day Smoker    Packs/day: 0.50     Years: 30.00    Pack years: 15.00    Types: Cigarettes  . Smokeless tobacco: Never Used  Vaping Use  . Vaping Use: Never used  Substance Use Topics  . Alcohol use: No  . Drug use: Yes    Types: Marijuana    Comment: occasional. Last used: last night.     Home Medications Prior to Admission medications   Medication Sig Start Date End Date Taking? Authorizing [name]  acetaminophen (TYLENOL) 325 MG tablet Take 650 mg by mouth every 6 (six) hours as needed.   Yes [name], Historical, MD  CABOMETYX 60 MG tablet TAKE 1 TABLET (60 MG) BY  MOUTH ONE TIME DAILY WITH  AT LEAST 8 OZ OF WATER ON  AN EMPTY STOMACH. DO NOT  EAT FOR 2 HOURS BEFORE OR 1 HOUR A Patient taking differently: Take 60 mg by mouth daily. 04/01/20  Yes [name], MD  calcium carbonate (TUMS - DOSED IN MG ELEMENTAL CALCIUM) 500 MG chewable tablet Chew 1 tablet by mouth 3 (three) times daily as needed for indigestion or heartburn.  Yes [name], Historical, MD  cetirizine (ZYRTEC) 10 MG tablet Take 10 mg by mouth daily as needed for allergies.   Yes [name], Historical, MD  ferrous sulfate 325 (65 FE) MG tablet TAKE 1 TABLET BY MOUTH EVERY DAY WITH BREAKFAST Patient taking differently: Take 325 mg by mouth daily. 10/24/20  Yes Zehr, Janett Billow D, PA-C  gabapentin (NEURONTIN) 300 MG capsule TAKE 1 CAPSULE BY MOUTH THREE TIMES A DAY Patient taking differently: Take 300 mg by mouth 3 (three) times daily. 05/10/20  Yes [name], MD  HYDROmorphone (DILAUDID) 8 MG tablet Take 1 tablet (8 mg total) by mouth every 4 (four) hours as needed for severe pain. Patient taking differently: Take 8 mg by mouth every 4 (four) hours as needed for moderate pain or severe pain. 02/17/18  Yes Babish, Rodman Key, PA-C  hydroxypropyl methylcellulose / hypromellose (ISOPTO TEARS / GONIOVISC) 2.5 % ophthalmic solution Place 1 drop into both eyes daily as needed for dry eyes.   Yes [name], Historical, MD  ondansetron (ZOFRAN) 4 MG tablet Take one  tablet every morning.  Can take one tablet later in the day on an as needed basis Patient taking differently: Take 4 mg by mouth daily. 10/10/20  Yes [name], MD  PAIN MANAGEMENT INTRATHECAL, IT, PUMP 1 each by Intrathecal route continuous. Intrathecal (IT) medication:   Morphine.   Yes [name], Historical, MD  pantoprazole (PROTONIX) 40 MG tablet Take 1 tablet (40 mg total) by mouth 2 (two) times daily. 03/27/20 03/27/21 Yes Zehr, Laban Emperor, PA-C  sucralfate (CARAFATE) 1 GM/10ML suspension Take 10 mLs (1 g total) by mouth every 6 (six) hours. 12/12/20 01/11/21 Yes [name], MD  XARELTO 20 MG TABS tablet TAKE 1 TABLET (20 MG TOTAL) BY MOUTH DAILY WITH SUPPER. Patient taking differently: Take 20 mg by mouth daily. 05/14/20  Yes Shadad, Mathis Dad, MD  ALPRAZolam (XANAX) 1 MG tablet TAKE 1 TABLET BY MOUTH THREE TIMES A DAY AS NEEDED FOR ANXIETY Patient taking differently: Take 1 mg by mouth 3 (three) times daily as needed for anxiety. 12/12/20   [name], MD  DULoxetine (CYMBALTA) 60 MG capsule TAKE 1 CAPSULE (60 MG TOTAL) BY MOUTH 2 (TWO) TIMES DAILY. Patient not taking: Reported on 12/18/2020 12/26/19   [name], MD  tiZANidine (ZANAFLEX) 4 MG tablet Take 1 tablet (4 mg total) by mouth every 8 (eight) hours as needed for muscle spasms. Patient not taking: Reported on 12/18/2020 02/17/18   Danae Orleans, PA-C    Allergies    Ceftriaxone and Hydrocodone  Review of Systems   Review of Systems  Constitutional: Negative for chills, diaphoresis, fatigue and fever.  HENT: Negative for congestion, sore throat and trouble swallowing.   Eyes: Negative for pain and visual disturbance.  Respiratory: Negative for cough, shortness of breath and wheezing.   Cardiovascular: Negative for chest pain, palpitations and leg swelling.  Gastrointestinal: Positive for blood in stool. Negative for abdominal distention, abdominal pain, diarrhea, nausea and vomiting.  Genitourinary: Negative for  difficulty urinating.  Musculoskeletal: Negative for back pain, neck pain and neck stiffness.  Skin: Positive for pallor. Negative for rash and wound.  Neurological: Positive for light-headedness. Negative for dizziness, speech difficulty, weakness and headaches.  Psychiatric/Behavioral: Negative for confusion.    Physical Exam Updated Vital Signs BP (!) 142/95   Pulse 99   Temp 97.7 F (36.5 C) (Oral)   Resp 12   Ht _0  (1.905 m)   Wt 73.9 kg  SpO2 96%   BMI 20.37 kg/m   Physical Exam Constitutional:      General: He is not in acute distress.    Appearance: Normal appearance. He is not ill-appearing, toxic-appearing or diaphoretic.     Comments: Very pleasant pale appearing male, laying in bed in no acute distress.  HENT:     Mouth/Throat:     Mouth: Mucous membranes are moist.     Pharynx: Oropharynx is clear.  Eyes:     General: No scleral icterus.    Extraocular Movements: Extraocular movements intact.     Pupils: Pupils are equal, round, and reactive to light.  Cardiovascular:     Rate and Rhythm: Regular rhythm. Tachycardia present.     Pulses: Normal pulses.     Heart sounds: Normal heart sounds.  Pulmonary:     Effort: Pulmonary effort is normal. No respiratory distress.     Breath sounds: Normal breath sounds. No stridor. No wheezing, rhonchi or rales.  Chest:     Chest wall: No tenderness.  Abdominal:     General: Abdomen is flat. There is no distension.     Palpations: Abdomen is soft.     Tenderness: There is no abdominal tenderness. There is no guarding or rebound.  Genitourinary:    Comments: Chaperone present. Digital Rectal exam reveals sphincter with good tone. No external hemorrhoids, masses, or fissures. Gross melena noted.  Musculoskeletal:        General: No swelling or tenderness. Normal range of motion.     Cervical back: Normal range of motion and neck supple. No rigidity.     Right lower leg: No edema.     Left lower leg: No edema.   Skin:    General: Skin is warm and dry.     Capillary Refill: Capillary refill takes less than 2 seconds.     Coloration: Skin is pale.  Neurological:     General: No focal deficit present.     Mental Status: He is alert and oriented to person, place, and time.  Psychiatric:        Mood and Affect: Mood normal.        Behavior: Behavior normal.     ED Results / Procedures / Treatments   Labs (all labs ordered are listed, but only abnormal results are displayed) Labs Reviewed  COMPREHENSIVE METABOLIC PANEL - Abnormal; Notable for the following components:      Result Value   Glucose, Bld 123 (*)    BUN 27 (*)    Creatinine, Ser 1.42 (*)    Calcium 7.6 (*)    Total Protein 5.2 (*)    Albumin 2.2 (*)    AST 14 (*)    GFR, Estimated 59 (*)    All other components within normal limits  CBC - Abnormal; Notable for the following components:   RBC 1.64 (*)    Hemoglobin 5.2 (*)    HCT 18.5 (*)    MCV 112.8 (*)    MCHC 28.1 (*)    RDW 17.1 (*)    Platelets 135 (*)    All other components within normal limits  POC OCCULT BLOOD, ED - Abnormal; Notable for the following components:   Fecal Occult Bld POSITIVE (*)    All other components within normal limits  SARS CORONAVIRUS 2 (TAT 6-24 HRS)  PROTIME-INR  APTT  TYPE AND SCREEN  PREPARE RBC (CROSSMATCH)    EKG None  Radiology No results found.  Procedures .Critical Care  Performed by: Alfredia Client, PA-C Authorized by: Alfredia Client, PA-C   Critical care [name] statement:    Critical care time (minutes):  45   Critical care was time spent personally by me on the following activities:  Discussions with consultants, evaluation of patient's response to treatment, examination of patient, ordering and performing treatments and interventions, ordering and review of laboratory studies, ordering and review of radiographic studies, pulse oximetry, re-evaluation of patient's condition, obtaining history from patient or  surrogate and review of old charts   (including critical care time)  Medications Ordered in ED Medications  0.9 %  sodium chloride infusion (Manually program via Guardrails IV Fluids) (has no administration in time range)  pantoprazole (PROTONIX) 80 mg in sodium chloride 0.9 % 100 mL IVPB (has no administration in time range)  pantoprazole (PROTONIX) 80 mg in sodium chloride 0.9 % 100 mL (0.8 mg/mL) infusion (has no administration in time range)    ED Course  I have reviewed the triage vital signs and the nursing notes.  Pertinent labs & imaging results that were available during my care of the patient were reviewed by me and considered in my medical decision making (see chart for details).    MDM Rules/Calculators/A&P                         SHAIN PAUWELS is a 54 y.o. male with pertinent past medical history of metastatic renal cancer that has spread to his bones, pancreas liver and lungs currently on oral chemotherapy (Cabometyx) followed by Dr.Shadad, diabetes, GI bleed that presents to the emergency department today for abnormal labs..  Hgb here today 5.2. Pt is symptomatic with tachycardia and dizziness, gross melena on exam without any abdominal tenderness. No active vomiting. Pt took Xarelto at [place] to [employer], [facility] who will consult. Advised 2 units of blood at this time in addition to protonix. Pt is stable, will admit to medicine.   Dr. Karleen Hampshire, will admit.   Pt is reasonably stabilized for admission considering the current resources, flow, and capabilities available in the ED at this time, and I doubt any other [facility] requiring further screening and/or treatment in the ED prior to admission.   Final Clinical Impression(s) / ED Diagnoses Final diagnoses:  Acute GI bleeding    Rx / DC Orders ED Discharge Orders    None       Alfredia Client, PA-C 12/18/20 1820    Veryl Speak, MD 12/18/20 2238

## 2020-12-18 NOTE — Telephone Encounter (Signed)
Hb is dangerously low, he needs to go to the ER

## 2020-12-18 NOTE — ED Notes (Signed)
Date and time results received: 12/18/20 3:30 PM  Test: Hemoglobin Critical Value: 5.2  Name of Provider Notified: Stark Jock, MD   Orders Received? Or Actions Taken?: Orders Received - See Orders for details

## 2020-12-18 NOTE — ED Notes (Signed)
PA made aware of BP

## 2020-12-18 NOTE — Telephone Encounter (Signed)
Dr.Jacobs - the lab called and reported a critical hgb of 6.2 and HCT of 20.4 for this pt.  RBC of 1.97.

## 2020-12-19 ENCOUNTER — Other Ambulatory Visit: Payer: Self-pay

## 2020-12-19 ENCOUNTER — Encounter (HOSPITAL_COMMUNITY)
Admission: EM | Disposition: A | Discharge status: Home / Self Care | Payer: Self-pay | Source: Home / Self Care | Attending: Family Medicine

## 2020-12-19 ENCOUNTER — Inpatient Hospital Stay (HOSPITAL_COMMUNITY): Payer: Medicare Other | Admitting: Anesthesiology

## 2020-12-19 ENCOUNTER — Encounter (HOSPITAL_COMMUNITY): Payer: Self-pay | Admitting: Internal Medicine

## 2020-12-19 DIAGNOSIS — D62 Acute posthemorrhagic anemia: Secondary | ICD-10-CM

## 2020-12-19 DIAGNOSIS — G893 Neoplasm related pain (acute) (chronic): Secondary | ICD-10-CM

## 2020-12-19 DIAGNOSIS — I2699 Other pulmonary embolism without acute cor pulmonale: Secondary | ICD-10-CM

## 2020-12-19 DIAGNOSIS — C7800 Secondary malignant neoplasm of unspecified lung: Secondary | ICD-10-CM

## 2020-12-19 DIAGNOSIS — K3189 Other diseases of stomach and duodenum: Secondary | ICD-10-CM

## 2020-12-19 HISTORY — PX: BIOPSY: SHX5522

## 2020-12-19 HISTORY — PX: ESOPHAGOGASTRODUODENOSCOPY (EGD) WITH PROPOFOL: SHX5813

## 2020-12-19 LAB — CBC
HCT: 23.7 % — ABNORMAL LOW (ref 39.0–52.0)
Hemoglobin: 7.2 g/dL — ABNORMAL LOW (ref 13.0–17.0)
MCH: 30.4 pg (ref 26.0–34.0)
MCHC: 30.4 g/dL (ref 30.0–36.0)
MCV: 100 fL (ref 80.0–100.0)
Platelets: 102 10*3/uL — ABNORMAL LOW (ref 150–400)
RBC: 2.37 MIL/uL — ABNORMAL LOW (ref 4.22–5.81)
RDW: 24.3 % — ABNORMAL HIGH (ref 11.5–15.5)
WBC: 6 10*3/uL (ref 4.0–10.5)
nRBC: 0.3 % — ABNORMAL HIGH (ref 0.0–0.2)

## 2020-12-19 LAB — HEMOGLOBIN AND HEMATOCRIT, BLOOD
HCT: 22.8 % — ABNORMAL LOW (ref 39.0–52.0)
HCT: 23.2 % — ABNORMAL LOW (ref 39.0–52.0)
HCT: 29.3 % — ABNORMAL LOW (ref 39.0–52.0)
Hemoglobin: 6.8 g/dL — CL (ref 13.0–17.0)
Hemoglobin: 6.9 g/dL — CL (ref 13.0–17.0)
Hemoglobin: 8.8 g/dL — ABNORMAL LOW (ref 13.0–17.0)

## 2020-12-19 LAB — PREPARE RBC (CROSSMATCH)

## 2020-12-19 LAB — SARS CORONAVIRUS 2 (TAT 6-24 HRS): SARS Coronavirus 2: NEGATIVE

## 2020-12-19 SURGERY — ESOPHAGOGASTRODUODENOSCOPY (EGD) WITH PROPOFOL
Anesthesia: Monitor Anesthesia Care

## 2020-12-19 MED ORDER — POLYETHYLENE GLYCOL 3350 17 GM/SCOOP PO POWD
0.5000 | Freq: Once | ORAL | Status: AC
  Administered 2020-12-19: 127.5 g via ORAL
  Filled 2020-12-19: qty 255

## 2020-12-19 MED ORDER — LACTATED RINGERS IV SOLN: INTRAVENOUS | Status: DC | PRN

## 2020-12-19 MED ORDER — ONDANSETRON HCL 4 MG PO TABS: 4.0000 mg | ORAL_TABLET | Freq: Four times a day (QID) | ORAL | Status: DC | PRN

## 2020-12-19 MED ORDER — HYDRALAZINE HCL 20 MG/ML IJ SOLN
5.0000 mg | Freq: Once | INTRAMUSCULAR | Status: AC
  Administered 2020-12-19: 5 mg via INTRAVENOUS

## 2020-12-19 MED ORDER — HYDROMORPHONE HCL 4 MG PO TABS: 8.0000 mg | ORAL_TABLET | ORAL | Status: DC | PRN

## 2020-12-19 MED ORDER — HYDRALAZINE HCL 20 MG/ML IJ SOLN
5.0000 mg | Freq: Four times a day (QID) | INTRAMUSCULAR | Status: DC | PRN
  Administered 2020-12-19: 5 mg via INTRAVENOUS
  Filled 2020-12-19: qty 1

## 2020-12-19 MED ORDER — HYDRALAZINE HCL 20 MG/ML IJ SOLN
INTRAMUSCULAR | Status: AC
  Filled 2020-12-19: qty 1

## 2020-12-19 MED ORDER — PROPOFOL 500 MG/50ML IV EMUL
INTRAVENOUS | Status: DC | PRN
  Administered 2020-12-19: 50 ug/kg/min via INTRAVENOUS
  Administered 2020-12-19: 40 mg via INTRAVENOUS

## 2020-12-19 MED ORDER — ACETAMINOPHEN 325 MG PO TABS
650.0000 mg | ORAL_TABLET | Freq: Four times a day (QID) | ORAL | Status: DC | PRN
  Filled 2020-12-19: qty 2

## 2020-12-19 MED ORDER — PEG-KCL-NACL-NASULF-NA ASC-C 100 G PO SOLR
1.0000 | Freq: Once | ORAL | Status: AC
  Administered 2020-12-19: 200 g via ORAL
  Filled 2020-12-19: qty 1

## 2020-12-19 MED ORDER — ACETAMINOPHEN 650 MG RE SUPP: 650.0000 mg | Freq: Four times a day (QID) | RECTAL | Status: DC | PRN

## 2020-12-19 MED ORDER — GABAPENTIN 300 MG PO CAPS
300.0000 mg | ORAL_CAPSULE | Freq: Three times a day (TID) | ORAL | Status: DC
  Administered 2020-12-19 – 2020-12-21 (×5): 300 mg via ORAL
  Filled 2020-12-19 (×5): qty 1

## 2020-12-19 MED ORDER — ALPRAZOLAM 1 MG PO TABS
1.0000 mg | ORAL_TABLET | Freq: Three times a day (TID) | ORAL | Status: DC | PRN
  Administered 2020-12-20: 1 mg via ORAL
  Filled 2020-12-19: qty 2

## 2020-12-19 MED ORDER — FUROSEMIDE 10 MG/ML IJ SOLN: 20.0000 mg | Freq: Once | INTRAMUSCULAR | Status: DC

## 2020-12-19 MED ORDER — HYDRALAZINE HCL 20 MG/ML IJ SOLN: 5.0000 mg | Freq: Once | INTRAMUSCULAR | Status: DC

## 2020-12-19 MED ORDER — ONDANSETRON HCL 4 MG/2ML IJ SOLN: 4.0000 mg | Freq: Four times a day (QID) | INTRAMUSCULAR | Status: DC | PRN

## 2020-12-19 MED ORDER — SODIUM CHLORIDE 0.9% IV SOLUTION: Freq: Once | INTRAVENOUS | Status: DC

## 2020-12-19 SURGICAL SUPPLY — 15 items

## 2020-12-19 NOTE — Anesthesia Preprocedure Evaluation (Addendum)
Anesthesia Evaluation  Patient identified by MRN, date of birth, ID band Patient awake    Reviewed: Allergy & Precautions, H&P , NPO status , Patient's Chart, lab work & pertinent test results  Airway Mallampati: III  TM Distance: >3 FB Neck ROM: Full    Dental no notable dental hx. (+) Edentulous Upper, Edentulous Lower, Dental Advisory Given   Pulmonary neg pulmonary ROS, Current Smoker and Patient abstained from smoking.,  Lung mets from primary renal CA   Pulmonary exam normal breath sounds clear to auscultation       Cardiovascular negative cardio ROS   Rhythm:Regular Rate:Normal     Neuro/Psych Depression negative neurological ROS     GI/Hepatic negative GI ROS, Neg liver ROS,   Endo/Other  diabetes  Renal/GU Renal diseaseH/o Renal CA  negative genitourinary   Musculoskeletal   Abdominal   Peds  Hematology  (+) Blood dyscrasia, anemia ,   Anesthesia Other Findings   Reproductive/Obstetrics negative OB ROS                            Anesthesia Physical Anesthesia Plan  ASA: III  Anesthesia Plan: MAC   Post-op Pain Management:    Induction: Intravenous  PONV Risk Score and Plan: 0 and Propofol infusion  Airway Management Planned: Nasal Cannula  Additional Equipment:   Intra-op Plan:   Post-operative Plan:   Informed Consent: I have reviewed the patients History and Physical, chart, labs and discussed the procedure including the risks, benefits and alternatives for the proposed anesthesia with the patient or authorized representative who has indicated his/her understanding and acceptance.     Dental advisory given  Plan Discussed with: CRNA  Anesthesia Plan Comments:         Anesthesia Quick Evaluation

## 2020-12-19 NOTE — Anesthesia Postprocedure Evaluation (Signed)
Anesthesia Post Note  Patient: Keith [name]  Procedure(s) Performed: ESOPHAGOGASTRODUODENOSCOPY (EGD) WITH PROPOFOL (N/A ) BIOPSY     Patient location during evaluation: Endoscopy Anesthesia Type: MAC Level of consciousness: awake and alert Pain management: pain level controlled Vital Signs Assessment: post-procedure vital signs reviewed and stable Respiratory status: spontaneous breathing, nonlabored ventilation and respiratory function stable Cardiovascular status: stable and blood pressure returned to baseline Postop Assessment: no apparent nausea or vomiting Anesthetic complications: no   No complications documented.  Last Vitals:  Vitals:   12/19/20 1530 12/19/20 1540  BP: (!) 178/101 (!) 176/99  Pulse: (!) 59 65  Resp: 19 20  Temp:    SpO2: 96% 99%    Last Pain:  Vitals:   12/19/20 1540  TempSrc:   PainSc: 0-No pain                 Kalel Harty,W. EDMOND

## 2020-12-19 NOTE — Consult Note (Addendum)
 Referring Provider:  [facility]         Primary Care Physician:  [name], Walter, MD Primary Gastroenterologist:  Keith Jacobs, MD               We were asked to see this patient for:   GI bleed               ASSESSMENT / PLAN:    # 54 yo male with metastatic renal cell cancer presenting with symptomatic acute on chronic macrocytic anemia and GI bleeding on Xarelto. Similar presentation in March 2021 resulting in EGD, colonoscopy and capsule endoscopy.  Based on described color of blood would suspect lower GI bleed. However, he has taken several doses of Ibuprofen recently and has a history of erosive gastritis and grade C esophagitis.  --Will likely start with EGD. If negative then may need repeat colonoscopy, especially since prep was inadequate on his last one in March.  --Last dose of Xarelto was yesterday morning. Based on renal function we would normal hold Xarelto two days prior to endoscopic procedure but may still decide to proceed today.  --Hgb improved from 5.2 to 7.2 post 2 units of RBCs --Continue IV PPI for now.  --Continue NPO for now. Will give clears if no plans for EGD today  # Hx of Vitamin B12 deficiency. Based on home med list he isn't on B12 supplement. Unless getting injections somewhere?  Last B12 check in Aug was 163.  -- B12 level is pending.  if still low then needs repletion  # Hx of blood clots , ? PE. Last dose of Xarelto was yesterday morning.      HPI:                                                                                                                             Chief Complaint:  Blood in stool  Keith [name] is a 54 y.o. male with PMH significant for CM, stage IV RCC with mets diagnosed in 2009 s/p radical nephrectomy and radiotherapy to pulmonary nodules and radiation to some areas of bony mets. On Cabometyx.   Patient is known to us most recently for evaluation of severe anemia / GI bleed in March 2021. He required several units  of blood during that admission. Upper endoscopy, colonoscopy and capsule endoscopy didn't show an obvious source of bleeding.  EGD showed friable gastritis, colonoscopy suggested dark blood throughout colon. Capsule endoscopy showed poor visualization but no overt bleeding.  It should be noted however that the colonoscopy prep was inadequate. For the last several months patient's hemoglobin remained stable in the 9 range however. the end of November it was 8. Patient called out office 2 days ago with complaints of nausea / vomiting and rectal bleeding. We changed his prn Zofran to scheduled dosing and obtained labs. Hgb was 6.2 so patient advised to go to ED. Patient is on Xarelto, last dose was yesterday morning.     Keith [name] started passing blood in his stool about 1.5 weeks ago.  He said the blood was initially light red then over the course of several days turned dark red.  He has had an increased frequency of bowel movements.  Some stools are hard but overall stools are loose.  Patient has chronic nausea and vomiting, no hematemesis.  Over the last couple of weeks he has taken several doses of ibuprofen for headaches.  He has Carafate and Protonix on home med list but cannot confirm that he is taking those.  Keith [name] has not had any abdominal pain.  His main complaint is weakness / SOB. No BMs or bleeding today    PREVIOUS ENDOSCOPIC EVALUATIONS / PERTINENT STUDIES   March 2021 -EGD showed friable gastritis.  -Colonoscopy suggested dark blood throughout colon.  It should be noted however that the colonoscopy prep was inadequate. -Capsule endoscopy showed poor visualization but no overt bleeding.   Past Medical History:  Diagnosis Date  . Diabetes mellitus without complication ([place])    diet controlled only  . GI bleed   . left renal ca d'd 2008  . Malignant neoplasm metastatic to pancreas ([place]) 2012  . 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 Metastasis to bone [facility]) 2014/2016    Past Surgical History:  Procedure  Laterality Date  . BIOPSY  08/28/2019   Procedure: BIOPSY;  Surgeon: Thornton Park, MD;  Location: [facility];  Service: Gastroenterology;;  . BIOPSY  03/01/2020   Procedure: BIOPSY;  Surgeon: Mauri Pole, MD;  Location: [facility];  Service: Endoscopy;;  . COLONOSCOPY W/ POLYPECTOMY    . COLONOSCOPY WITH PROPOFOL N/A 03/01/2020   Procedure: COLONOSCOPY WITH PROPOFOL;  Surgeon: Mauri Pole, MD;  Location: [facility];  Service: Endoscopy;  Laterality: N/A;  . ESOPHAGOGASTRODUODENOSCOPY (EGD) WITH PROPOFOL N/A 08/28/2019   Procedure: ESOPHAGOGASTRODUODENOSCOPY (EGD) WITH PROPOFOL;  Surgeon: Thornton Park, MD;  Location: [facility];  Service: Gastroenterology;  Laterality: N/A;  . ESOPHAGOGASTRODUODENOSCOPY (EGD) WITH PROPOFOL N/A 03/01/2020   Procedure: ESOPHAGOGASTRODUODENOSCOPY (EGD) WITH PROPOFOL;  Surgeon: Mauri Pole, MD;  Location: [facility];  Service: Endoscopy;  Laterality: N/A;  . FEMUR IM NAIL Right 02/16/2018   Procedure: INTRAMEDULLARY (IM) NAIL RIGHT FEMORAL;  Surgeon: Paralee Cancel, MD;  Location: [facility];  Service: Orthopedics;  Laterality: Right;  . GIVENS CAPSULE STUDY N/A 03/03/2020   Procedure: GIVENS CAPSULE STUDY;  Surgeon: Mauri Pole, MD;  Location: [facility];  Service: Endoscopy;  Laterality: N/A;  . HAND SURGERY Right   . IR ANGIOGRAM EXTREMITY RIGHT  02/15/2018  . IR ANGIOGRAM SELECTIVE EACH ADDITIONAL VESSEL  02/15/2018  . IR ANGIOGRAM SELECTIVE EACH ADDITIONAL VESSEL  02/15/2018  . IR ANGIOGRAM SELECTIVE EACH ADDITIONAL VESSEL  02/15/2018  . IR ANGIOGRAM SELECTIVE EACH ADDITIONAL VESSEL  02/15/2018  . IR EMBO TUMOR ORGAN ISCHEMIA INFARCT INC GUIDE ROADMAPPING  02/15/2018  . IR EMBO TUMOR ORGAN ISCHEMIA INFARCT INC GUIDE ROADMAPPING  02/15/2018  . IR US GUIDE VASC ACCESS LEFT  02/15/2018  . LUNG REMOVAL, PARTIAL Right 09/2008  . NEPHRECTOMY RADICAL     Left   . PAIN PUMP IMPLANTATION N/A 05/16/2015   Procedure: Intrathecal pain  pump placement;  Surgeon: Clydell Hakim, MD;  Location: [facility];  Service: Neurosurgery;  Laterality: N/A;  Intrathecal pain pump placement    Prior to Admission medications   Medication Sig Start Date End Date Taking? Authorizing Provider  acetaminophen (TYLENOL) 325 MG tablet Take 650 mg by mouth every 6 (six) hours  as needed.   Yes [provider]  CABOMETYX 60 MG tablet TAKE 1 TABLET (60 MG) BY  MOUTH ONE TIME DAILY WITH  AT LEAST 8 OZ OF WATER ON  AN EMPTY STOMACH. DO NOT  EAT FOR 2 HOURS BEFORE OR 1 HOUR A Patient taking differently: Take 60 mg by mouth daily. 04/01/20  Yes Shadad, Firas N, MD  calcium carbonate (TUMS - DOSED IN MG ELEMENTAL CALCIUM) 500 MG chewable tablet Chew 1 tablet by mouth 3 (three) times daily as needed for indigestion or heartburn.    Yes [provider]  cetirizine (ZYRTEC) 10 MG tablet Take 10 mg by mouth daily as needed for allergies.   Yes [provider]  ferrous sulfate 325 (65 FE) MG tablet TAKE 1 TABLET BY MOUTH EVERY DAY WITH BREAKFAST Patient taking differently: Take 325 mg by mouth daily. 10/24/20  Yes Zehr, Jessica D, PA-C  gabapentin (NEURONTIN) 300 MG capsule TAKE 1 CAPSULE BY MOUTH THREE TIMES A DAY Patient taking differently: Take 300 mg by mouth 3 (three) times daily. 05/10/20  Yes Shadad, Firas N, MD  HYDROmorphone (DILAUDID) 8 MG tablet Take 1 tablet (8 mg total) by mouth every 4 (four) hours as needed for severe pain. Patient taking differently: Take 8 mg by mouth every 4 (four) hours as needed for moderate pain or severe pain. 02/17/18  Yes Babish, Matthew, PA-C  hydroxypropyl methylcellulose / hypromellose (ISOPTO TEARS / GONIOVISC) 2.5 % ophthalmic solution Place 1 drop into both eyes daily as needed for dry eyes.   Yes [provider]  ondansetron (ZOFRAN) 4 MG tablet Take one tablet every morning.  Can take one tablet later in the day on an as needed basis Patient taking differently: Take 4 mg by mouth  daily. 10/10/20  Yes [name], Daniel P, MD  PAIN MANAGEMENT INTRATHECAL, IT, PUMP 1 each by Intrathecal route continuous. Intrathecal (IT) medication:   Morphine.   Yes [provider]  pantoprazole (PROTONIX) 40 MG tablet Take 1 tablet (40 mg total) by mouth 2 (two) times daily. 03/27/20 03/27/21 Yes Zehr, Jessica D, PA-C  sucralfate (CARAFATE) 1 GM/10ML suspension Take 10 mLs (1 g total) by mouth every 6 (six) hours. 12/12/20 01/11/21 Yes [name], Daniel P, MD  XARELTO 20 MG TABS tablet TAKE 1 TABLET (20 MG TOTAL) BY MOUTH DAILY WITH SUPPER. Patient taking differently: Take 20 mg by mouth daily. 05/14/20  Yes Shadad, Firas N, MD  ALPRAZolam (XANAX) 1 MG tablet TAKE 1 TABLET BY MOUTH THREE TIMES A DAY AS NEEDED FOR ANXIETY Patient taking differently: Take 1 mg by mouth 3 (three) times daily as needed for anxiety. 12/12/20   Shadad, Firas N, MD  DULoxetine (CYMBALTA) 60 MG capsule TAKE 1 CAPSULE (60 MG TOTAL) BY MOUTH 2 (TWO) TIMES DAILY. Patient not taking: Reported on 12/18/2020 12/26/19 12/18/20  Shadad, Firas N, MD    Current Facility-Administered Medications  Medication Dose Route Frequency Provider Last Rate Last Admin  . pantoprazole (PROTONIX) 80 mg in sodium chloride 0.9 % 100 mL (0.8 mg/mL) infusion  8 mg/hr Intravenous Continuous Patel, Shalyn, PA-C 10 mL/hr at 12/19/20 0505 8 mg/hr at 12/19/20 0505   Current Outpatient Medications  Medication Sig Dispense Refill  . acetaminophen (TYLENOL) 325 MG tablet Take 650 mg by mouth every 6 (six) hours as needed.    . CABOMETYX 60 MG tablet TAKE 1 TABLET (60 MG) BY  MOUTH ONE TIME DAILY WITH  AT LEAST 8 OZ OF WATER ON  AN   EMPTY STOMACH. DO NOT  EAT FOR 2 HOURS BEFORE OR 1 HOUR A (Patient taking differently: Take 60 mg by mouth daily.) 30 tablet 11  . calcium carbonate (TUMS - DOSED IN MG ELEMENTAL CALCIUM) 500 MG chewable tablet Chew 1 tablet by mouth 3 (three) times daily as needed for indigestion or heartburn.     . cetirizine (ZYRTEC) 10 MG  tablet Take 10 mg by mouth daily as needed for allergies.    . ferrous sulfate 325 (65 FE) MG tablet TAKE 1 TABLET BY MOUTH EVERY DAY WITH BREAKFAST (Patient taking differently: Take 325 mg by mouth daily.) 90 tablet 2  . gabapentin (NEURONTIN) 300 MG capsule TAKE 1 CAPSULE BY MOUTH THREE TIMES A DAY (Patient taking differently: Take 300 mg by mouth 3 (three) times daily.) 270 capsule 0  . HYDROmorphone (DILAUDID) 8 MG tablet Take 1 tablet (8 mg total) by mouth every 4 (four) hours as needed for severe pain. (Patient taking differently: Take 8 mg by mouth every 4 (four) hours as needed for moderate pain or severe pain.) 30 tablet 0  . hydroxypropyl methylcellulose / hypromellose (ISOPTO TEARS / GONIOVISC) 2.5 % ophthalmic solution Place 1 drop into both eyes daily as needed for dry eyes.    [name] ondansetron (ZOFRAN) 4 MG tablet Take one tablet every morning.  Can take one tablet later in the day on an as needed basis (Patient taking differently: Take 4 mg by mouth daily.) 50 tablet 11  . PAIN MANAGEMENT INTRATHECAL, IT, PUMP 1 each by Intrathecal route continuous. Intrathecal (IT) medication:   Morphine.    . pantoprazole (PROTONIX) 40 MG tablet Take 1 tablet (40 mg total) by mouth 2 (two) times daily. 30 tablet 11  . sucralfate (CARAFATE) 1 GM/10ML suspension Take 10 mLs (1 g total) by mouth every 6 (six) hours. 1200 mL 1  . XARELTO 20 MG TABS tablet TAKE 1 TABLET (20 MG TOTAL) BY MOUTH DAILY WITH SUPPER. (Patient taking differently: Take 20 mg by mouth daily.) 90 tablet 3  . ALPRAZolam (XANAX) 1 MG tablet TAKE 1 TABLET BY MOUTH THREE TIMES A DAY AS NEEDED FOR ANXIETY (Patient taking differently: Take 1 mg by mouth 3 (three) times daily as needed for anxiety.) 90 tablet 0    Allergies as of 12/18/2020 - Review Complete 12/18/2020  Allergen Reaction Noted  . Ceftriaxone Hives 08/06/2011  . Hydrocodone Swelling 08/06/2011    Family History  Problem Relation Age of Onset  . Hypertension Mother   .  Diabetes Brother   . Irritable bowel syndrome Sister   . Cancer Maternal Aunt        ovarian  . Colon cancer Neg Hx     Social History   Socioeconomic History  . Marital status: Married    Spouse name: Not on file  . Number of children: 2  . Years of education: Not on file  . Highest education level: Not on file  Occupational History  . Occupation: Clinical biochemist   Tobacco Use  . Smoking status: Current Every Day Smoker    Packs/day: 0.50    Years: 30.00    Pack years: 15.00    Types: Cigarettes  . Smokeless tobacco: Never Used  Vaping Use  . Vaping Use: Never used  Substance and Sexual Activity  . Alcohol use: No  . Drug use: Yes    Types: Marijuana    Comment: occasional. Last used: last night.   . Sexual activity: Not Currently  Other Topics Concern  .  Not on file  Social History Narrative   6 caffeine drinks daily    08-11-18  Unable to ask abuse questions wife with him today.   Social Determinants of Health   Financial Resource Strain: Not on file  Food Insecurity: Not on file  Transportation Needs: Not on file  Physical Activity: Not on file  Stress: Not on file  Social Connections: Not on file  Intimate Partner Violence: Not on file    Review of Systems: All systems reviewed and negative except where noted in HPI.  OBJECTIVE:    Physical Exam: Vital signs in last 24 hours: Temp:  [97.7 F (36.5 C)-98.1 F (36.7 C)] 98.1 F (36.7 C) (12/28 2038) Pulse Rate:  [39-116] 74 (12/29 0730) Resp:  [11-21] 15 (12/29 0730) BP: (124-173)/(74-105) 134/85 (12/29 0730) SpO2:  [84 %-100 %] 91 % (12/29 0730) Weight:  [73.9 kg] 73.9 kg (12/28 1432)   General:   Alert  Thin male in NAD Psych:  Pleasant, cooperative. Normal mood and affect. Eyes:  Pupils equal, sclera clear, no icterus.   Conjunctiva pink. Ears:  Normal auditory acuity. Nose:  No deformity, discharge,  or lesions. Neck:  Supple; no masses Lungs:  Scattered Rhonchi.  Heart:  Regular rate and  rhythm; edematous left hand, + bilateral lower extremity edema Abdomen:  Soft, non-distended, nontender, BS active, no palp mass   Rectal:  Deferred  Msk:  Symmetrical without gross deformities. . Neurologic:  Alert and  oriented x4;  grossly normal neurologically. Skin:  Intact without significant lesions or rashes.  Filed Weights   12/18/20 1432  Weight: 73.9 kg     Scheduled inpatient medications  Intake/Output from previous day: 12/28 0701 - 12/29 0700 In: 1575 [Blood:1575] Out: -  Intake/Output this shift: No intake/output data recorded.   Lab Results: Recent Labs    12/18/20 0845 12/18/20 1454 12/19/20 0350  WBC 9.5 6.9 6.0  HGB 6.2 Repeated and verified X2.* 5.2* 7.2*  HCT 20.4* 18.5* 23.7*  PLT 164.0 135* 102*   BMET Recent Labs    12/18/20 1454  NA 142  K 4.0  CL 110  CO2 26  GLUCOSE 123*  BUN 27*  CREATININE 1.42*  CALCIUM 7.6*   LFT Recent Labs    12/18/20 1454  PROT 5.2*  ALBUMIN 2.2*  AST 14*  ALT 14  ALKPHOS 59  BILITOT 0.3   PT/INR Recent Labs    12/18/20 1630  LABPROT 23.8*  INR 2.2*   Hepatitis Panel No results for input(s): HEPBSAG, HCVAB, HEPAIGM, HEPBIGM in the last 72 hours.   . CBC Latest Ref Rng & Units 12/19/2020 12/18/2020 12/18/2020  WBC 4.0 - 10.5 K/uL 6.0 6.9 9.5  Hemoglobin 13.0 - 17.0 g/dL 7.2(L) 5.2(LL) 6.2 Repeated and verified X2.(LL)  Hematocrit 39.0 - 52.0 % 23.7(L) 18.5(L) 20.4(LL)  Platelets 150 - 400 K/uL 102(L) 135(L) 164.0    . CMP Latest Ref Rng & Units 12/18/2020 11/20/2020 09/21/2020  Glucose 70 - 99 mg/dL 123(H) 107(H) 106(H)  BUN 6 - 20 mg/dL 27(H) 18 18  Creatinine 0.61 - 1.24 mg/dL 1.42(H) 1.22 1.45(H)  Sodium 135 - 145 mmol/L 142 142 138  Potassium 3.5 - 5.1 mmol/L 4.0 4.3 4.6  Chloride 98 - 111 mmol/L 110 113(H) 112(H)  CO2 22 - 32 mmol/L 26 24 23  Calcium 8.9 - 10.3 mg/dL 7.6(L) 7.7(L) 7.8(L)  Total Protein 6.5 - 8.1 g/dL 5.2(L) 5.6(L) 5.8(L)  Total Bilirubin 0.3 - 1.2 mg/dL 0.3  0.2(L) <0.2(L)    Alkaline Phos 38 - 126 U/L 59 76 76  AST 15 - 41 U/L 14(L) 18 15  ALT 0 - 44 U/L _0 Studies/Results: No results found.  Active Problems:   Renal cancer ([place])   Bone metastasis ([place])   Metastasis to lung [facility])   Cancer related pain   Pulmonary embolus, left ([facility])   Acute GI bleeding   Acute blood loss anemia   GI bleed    Tye Savoy, NP-C @  12/19/2020, 8:48 AM

## 2020-12-19 NOTE — Transfer of Care (Signed)
Immediate Anesthesia Transfer of Care Note  Patient: Keith [name]  Procedure(s) Performed: Procedure(s): ESOPHAGOGASTRODUODENOSCOPY (EGD) WITH PROPOFOL (N/A) BIOPSY  Patient Location: PACU and Endoscopy Unit  Anesthesia Type:MAC  Level of Consciousness: awake, alert  and oriented  Airway & Oxygen Therapy: Patient Spontanous Breathing and Patient connected to nasal cannula oxygen  Post-op Assessment: Report given to RN and Post -op Vital signs reviewed and stable  Post vital signs: Reviewed and stable  Last Vitals:  Vitals:   12/19/20 1300 12/19/20 1339  BP: (!) 149/90 (!) 189/99  Pulse: 60 68  Resp: 13 16  Temp:  (!) 35.9 C  SpO2: 384% 66%    Complications: No apparent anesthesia complications

## 2020-12-19 NOTE — Op Note (Addendum)
[facility] Patient Name: Keith [name] Procedure Date: 12/19/2020 MRN: [id number] Attending MD: Carlota Raspberry. Havery Moros , MD Date of Birth: [birth year] CSN: [id number] Age: 54 Admit Type: Outpatient Procedure:                Upper GI endoscopy Indications:              Gastrointestinal bleeding of unknown origin -                            previously had EGD, colonoscopy (poor prep),                            capsule study, CTA, tagged RBC scan in March for                            similar bleeding. Now with "red and black stools",                            on Xarelto, last dose yesterday AM Providers:                Carlota Raspberry. Havery Moros, MD, Benay Pillow, RN,                            Nelia Shi, RN, Ladona Ridgel, Technician,                            Eliberto Ivory, CRNA Referring MD:              Medicines:                Monitored Anesthesia Care Complications:            No immediate complications. Estimated blood loss:                            Minimal. Estimated Blood Loss:     Estimated blood loss was minimal. Procedure:                Pre-Anesthesia Assessment:                           - Prior to the procedure, a History and Physical                            was performed, and patient medications and                            allergies were reviewed. The patient's tolerance of                            previous anesthesia was also reviewed. The risks                            and benefits of the procedure and the sedation  options and risks were discussed with the patient.                            All questions were answered, and informed consent                            was obtained. Prior Anticoagulants: The patient has                            taken Xarelto (rivaroxaban), last dose was 1 day                            prior to procedure. ASA Grade Assessment: III - A                            patient  with severe systemic disease. After                            reviewing the risks and benefits, the patient was                            deemed in satisfactory condition to undergo the                            procedure.                           After obtaining informed consent, the endoscope was                            passed under direct vision. Throughout the                            procedure, the patient's blood pressure, pulse, and                            oxygen saturations were monitored continuously. The                            [id number]-H190 ([id number]) Olympus gastroscope was                            introduced through the mouth, and advanced to the                            third part of duodenum. The upper GI endoscopy was                            accomplished without difficulty. The patient                            tolerated the procedure well. Scope In: Scope Out: Findings:      Esophagogastric landmarks were identified: the Z-line was found at 43  cm, the gastroesophageal junction was found at 43 cm and the upper       extent of the gastric folds was found at 43 cm from the incisors.      The exam of the esophagus was otherwise normal.      Patchy mildly erythematous mucosa was found in the gastric fundus. No       heme in the stomach noted anywhere. No focal ulcerations. Biopsies were       taken with a cold forceps for Helicobacter pylori testing.      The exam of the stomach was otherwise normal.      The duodenal bulb, second portion of the duodenum and third portion of       the duodenum were normal. Duodenal sweep somewhat edematous but no       pathology visualized. There was some mild contact irritation to the       sweep upon removal of the endoscope. Impression:               - Esophagogastric landmarks identified.                           - Normal esophagus                           - Mildly erythematous mucosa in the gastric fundus.                             Biopsies taken from the stomach to rule out H pylori                           - Normal stomach otherwise - no heme or ulceration                           - Normal duodenal bulb, second portion of the                            duodenum and third portion of the duodenum. No heme                            noted anywhere                           No cause for bleeding on EGD. Suspect colonic or                            distal small bowel source. Given last colonoscopy                            was limited by poor prep, recommend repeat                            colonoscopy tomorrow if the patient is willing. If                            significant overt bleeding in the interim would  evaluate with CTA. Moderate Sedation:      No moderate sedation, case performed with MAC Recommendation:           - Return patient to hospital ward for ongoing care.                           - Clear liquid diet.                           - Continue present medications.                           - Continue to hold Xarelto                           - another unit RBC transfusion for Hgb of 6.9                           - bowel prep for colonoscopy tonight, plan for                            colonoscopy tomorrow. Have ordered 1 and 1/2 bowel                            prep for him, hopefully that is adequate                           - await pathology results.                           - if patient has significant overt bleeding in the                            interim recommend CTA or tagged RBC scan to help                            localize Procedure Code(s):        --- Professional ---                           [phone], Esophagogastroduodenoscopy, flexible,                            transoral; with biopsy, single or multiple Diagnosis Code(s):        --- Professional ---                           K31.89, Other diseases of stomach and duodenum                            K92.2, Gastrointestinal hemorrhage, unspecified CPT copyright 2019 [employer]. All rights reserved. The codes documented in this report are preliminary and upon coder review may  be revised to meet current compliance requirements. Remo Lipps P. Frankee Gritz, MD 12/19/2020 3:05:35 PM This report has been signed electronically. Number of Addenda: 0

## 2020-12-19 NOTE — H&P (View-Only) (Signed)
 Referring Provider:  [facility]         Primary Care Physician:  [name], Walter, MD Primary Gastroenterologist:  Keith Jacobs, MD               We were asked to see this patient for:   GI bleed               ASSESSMENT / PLAN:    # 54 yo male with metastatic renal cell cancer presenting with symptomatic acute on chronic macrocytic anemia and GI bleeding on Xarelto. Similar presentation in March 2021 resulting in EGD, colonoscopy and capsule endoscopy.  Based on described color of blood would suspect lower GI bleed. However, he has taken several doses of Ibuprofen recently and has a history of erosive gastritis and grade C esophagitis.  --Will likely start with EGD. If negative then may need repeat colonoscopy, especially since prep was inadequate on his last one in March.  --Last dose of Xarelto was yesterday morning. Based on renal function we would normal hold Xarelto two days prior to endoscopic procedure but may still decide to proceed today.  --Hgb improved from 5.2 to 7.2 post 2 units of RBCs --Continue IV PPI for now.  --Continue NPO for now. Will give clears if no plans for EGD today  # Hx of Vitamin B12 deficiency. Based on home med list he isn't on B12 supplement. Unless getting injections somewhere?  Last B12 check in Aug was 163.  -- B12 level is pending.  if still low then needs repletion  # Hx of blood clots , ? PE. Last dose of Xarelto was yesterday morning.      HPI:                                                                                                                             Chief Complaint:  Blood in stool  Keith [name] is a 54 y.o. male with PMH significant for CM, stage IV RCC with mets diagnosed in 2009 s/p radical nephrectomy and radiotherapy to pulmonary nodules and radiation to some areas of bony mets. On Cabometyx.   Patient is known to us most recently for evaluation of severe anemia / GI bleed in March 2021. He required several units  of blood during that admission. Upper endoscopy, colonoscopy and capsule endoscopy didn't show an obvious source of bleeding.  EGD showed friable gastritis, colonoscopy suggested dark blood throughout colon. Capsule endoscopy showed poor visualization but no overt bleeding.  It should be noted however that the colonoscopy prep was inadequate. For the last several months patient's hemoglobin remained stable in the 9 range however. the end of November it was 8. Patient called out office 2 days ago with complaints of nausea / vomiting and rectal bleeding. We changed his prn Zofran to scheduled dosing and obtained labs. Hgb was 6.2 so patient advised to go to ED. Patient is on Xarelto, last dose was yesterday morning.     Keith [name] started passing blood in his stool about 1.5 weeks ago.  He said the blood was initially light red then over the course of several days turned dark red.  He has had an increased frequency of bowel movements.  Some stools are hard but overall stools are loose.  Patient has chronic nausea and vomiting, no hematemesis.  Over the last couple of weeks he has taken several doses of ibuprofen for headaches.  He has Carafate and Protonix on home med list but cannot confirm that he is taking those.  Keith [name] has not had any abdominal pain.  His main complaint is weakness / SOB. No BMs or bleeding today    PREVIOUS ENDOSCOPIC EVALUATIONS / PERTINENT STUDIES   March 2021 -EGD showed friable gastritis.  -Colonoscopy suggested dark blood throughout colon.  It should be noted however that the colonoscopy prep was inadequate. -Capsule endoscopy showed poor visualization but no overt bleeding.   Past Medical History:  Diagnosis Date  . Diabetes mellitus without complication (HCC)    diet controlled only  . GI bleed   . left renal ca d'd 2008  . 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 Metastasis to bone (HCC) 2014/2016    Past Surgical History:  Procedure  Laterality Date  . BIOPSY  08/28/2019   Procedure: BIOPSY;  Surgeon: Beavers, Kimberly, MD;  Location: [facility];  Service: Gastroenterology;;  . BIOPSY  03/01/2020   Procedure: BIOPSY;  Surgeon: Nandigam, Kavitha V, MD;  Location: [facility];  Service: Endoscopy;;  . COLONOSCOPY W/ POLYPECTOMY    . COLONOSCOPY WITH PROPOFOL N/A 03/01/2020   Procedure: COLONOSCOPY WITH PROPOFOL;  Surgeon: Nandigam, Kavitha V, MD;  Location: [facility];  Service: Endoscopy;  Laterality: N/A;  . ESOPHAGOGASTRODUODENOSCOPY (EGD) WITH PROPOFOL N/A 08/28/2019   Procedure: ESOPHAGOGASTRODUODENOSCOPY (EGD) WITH PROPOFOL;  Surgeon: Beavers, Kimberly, MD;  Location: [facility];  Service: Gastroenterology;  Laterality: N/A;  . ESOPHAGOGASTRODUODENOSCOPY (EGD) WITH PROPOFOL N/A 03/01/2020   Procedure: ESOPHAGOGASTRODUODENOSCOPY (EGD) WITH PROPOFOL;  Surgeon: Nandigam, Kavitha V, MD;  Location: [facility];  Service: Endoscopy;  Laterality: N/A;  . FEMUR IM NAIL Right 02/16/2018   Procedure: INTRAMEDULLARY (IM) NAIL RIGHT FEMORAL;  Surgeon: Olin, Matthew, MD;  Location: [facility];  Service: Orthopedics;  Laterality: Right;  . GIVENS CAPSULE STUDY N/A 03/03/2020   Procedure: GIVENS CAPSULE STUDY;  Surgeon: Nandigam, Kavitha V, MD;  Location: [facility];  Service: Endoscopy;  Laterality: N/A;  . HAND SURGERY Right   . IR ANGIOGRAM EXTREMITY RIGHT  02/15/2018  . IR ANGIOGRAM SELECTIVE EACH ADDITIONAL VESSEL  02/15/2018  . IR ANGIOGRAM SELECTIVE EACH ADDITIONAL VESSEL  02/15/2018  . IR ANGIOGRAM SELECTIVE EACH ADDITIONAL VESSEL  02/15/2018  . IR ANGIOGRAM SELECTIVE EACH ADDITIONAL VESSEL  02/15/2018  . IR EMBO TUMOR ORGAN ISCHEMIA INFARCT INC GUIDE ROADMAPPING  02/15/2018  . IR EMBO TUMOR ORGAN ISCHEMIA INFARCT INC GUIDE ROADMAPPING  02/15/2018  . IR US GUIDE VASC ACCESS LEFT  02/15/2018  . LUNG REMOVAL, PARTIAL Right 09/2008  . NEPHRECTOMY RADICAL     Left   . PAIN PUMP IMPLANTATION N/A 05/16/2015   Procedure: Intrathecal pain  pump placement;  Surgeon: Paul Harkins, MD;  Location: [facility];  Service: Neurosurgery;  Laterality: N/A;  Intrathecal pain pump placement    Prior to Admission medications   Medication Sig Start Date End Date Taking? Authorizing Provider  acetaminophen (TYLENOL) 325 MG tablet Take 650 mg by mouth every 6 (six) hours   as needed.   Yes [provider]  CABOMETYX 60 MG tablet TAKE 1 TABLET (60 MG) BY  MOUTH ONE TIME DAILY WITH  AT LEAST 8 OZ OF WATER ON  AN EMPTY STOMACH. DO NOT  EAT FOR 2 HOURS BEFORE OR 1 HOUR A Patient taking differently: Take 60 mg by mouth daily. 04/01/20  Yes Shadad, Firas N, MD  calcium carbonate (TUMS - DOSED IN MG ELEMENTAL CALCIUM) 500 MG chewable tablet Chew 1 tablet by mouth 3 (three) times daily as needed for indigestion or heartburn.    Yes [provider]  cetirizine (ZYRTEC) 10 MG tablet Take 10 mg by mouth daily as needed for allergies.   Yes [provider]  ferrous sulfate 325 (65 FE) MG tablet TAKE 1 TABLET BY MOUTH EVERY DAY WITH BREAKFAST Patient taking differently: Take 325 mg by mouth daily. 10/24/20  Yes Zehr, Jessica D, PA-C  gabapentin (NEURONTIN) 300 MG capsule TAKE 1 CAPSULE BY MOUTH THREE TIMES A DAY Patient taking differently: Take 300 mg by mouth 3 (three) times daily. 05/10/20  Yes Shadad, Firas N, MD  HYDROmorphone (DILAUDID) 8 MG tablet Take 1 tablet (8 mg total) by mouth every 4 (four) hours as needed for severe pain. Patient taking differently: Take 8 mg by mouth every 4 (four) hours as needed for moderate pain or severe pain. 02/17/18  Yes Babish, Matthew, PA-C  hydroxypropyl methylcellulose / hypromellose (ISOPTO TEARS / GONIOVISC) 2.5 % ophthalmic solution Place 1 drop into both eyes daily as needed for dry eyes.   Yes [provider]  ondansetron (ZOFRAN) 4 MG tablet Take one tablet every morning.  Can take one tablet later in the day on an as needed basis Patient taking differently: Take 4 mg by mouth  daily. 10/10/20  Yes [name], Daniel P, MD  PAIN MANAGEMENT INTRATHECAL, IT, PUMP 1 each by Intrathecal route continuous. Intrathecal (IT) medication:   Morphine.   Yes [provider]  pantoprazole (PROTONIX) 40 MG tablet Take 1 tablet (40 mg total) by mouth 2 (two) times daily. 03/27/20 03/27/21 Yes Zehr, Jessica D, PA-C  sucralfate (CARAFATE) 1 GM/10ML suspension Take 10 mLs (1 g total) by mouth every 6 (six) hours. 12/12/20 01/11/21 Yes [name], Daniel P, MD  XARELTO 20 MG TABS tablet TAKE 1 TABLET (20 MG TOTAL) BY MOUTH DAILY WITH SUPPER. Patient taking differently: Take 20 mg by mouth daily. 05/14/20  Yes Shadad, Firas N, MD  ALPRAZolam (XANAX) 1 MG tablet TAKE 1 TABLET BY MOUTH THREE TIMES A DAY AS NEEDED FOR ANXIETY Patient taking differently: Take 1 mg by mouth 3 (three) times daily as needed for anxiety. 12/12/20   Shadad, Firas N, MD  DULoxetine (CYMBALTA) 60 MG capsule TAKE 1 CAPSULE (60 MG TOTAL) BY MOUTH 2 (TWO) TIMES DAILY. Patient not taking: Reported on 12/18/2020 12/26/19 12/18/20  Shadad, Firas N, MD    Current Facility-Administered Medications  Medication Dose Route Frequency Provider Last Rate Last Admin  . pantoprazole (PROTONIX) 80 mg in sodium chloride 0.9 % 100 mL (0.8 mg/mL) infusion  8 mg/hr Intravenous Continuous Patel, Shalyn, PA-C 10 mL/hr at 12/19/20 0505 8 mg/hr at 12/19/20 0505   Current Outpatient Medications  Medication Sig Dispense Refill  . acetaminophen (TYLENOL) 325 MG tablet Take 650 mg by mouth every 6 (six) hours as needed.    . CABOMETYX 60 MG tablet TAKE 1 TABLET (60 MG) BY  MOUTH ONE TIME DAILY WITH  AT LEAST 8 OZ OF WATER ON  AN   EMPTY STOMACH. DO NOT  EAT FOR 2 HOURS BEFORE OR 1 HOUR A (Patient taking differently: Take 60 mg by mouth daily.) 30 tablet 11  . calcium carbonate (TUMS - DOSED IN MG ELEMENTAL CALCIUM) 500 MG chewable tablet Chew 1 tablet by mouth 3 (three) times daily as needed for indigestion or heartburn.     . cetirizine (ZYRTEC) 10 MG  tablet Take 10 mg by mouth daily as needed for allergies.    . ferrous sulfate 325 (65 FE) MG tablet TAKE 1 TABLET BY MOUTH EVERY DAY WITH BREAKFAST (Patient taking differently: Take 325 mg by mouth daily.) 90 tablet 2  . gabapentin (NEURONTIN) 300 MG capsule TAKE 1 CAPSULE BY MOUTH THREE TIMES A DAY (Patient taking differently: Take 300 mg by mouth 3 (three) times daily.) 270 capsule 0  . HYDROmorphone (DILAUDID) 8 MG tablet Take 1 tablet (8 mg total) by mouth every 4 (four) hours as needed for severe pain. (Patient taking differently: Take 8 mg by mouth every 4 (four) hours as needed for moderate pain or severe pain.) 30 tablet 0  . hydroxypropyl methylcellulose / hypromellose (ISOPTO TEARS / GONIOVISC) 2.5 % ophthalmic solution Place 1 drop into both eyes daily as needed for dry eyes.    . ondansetron (ZOFRAN) 4 MG tablet Take one tablet every morning.  Can take one tablet later in the day on an as needed basis (Patient taking differently: Take 4 mg by mouth daily.) 50 tablet 11  . PAIN MANAGEMENT INTRATHECAL, IT, PUMP 1 each by Intrathecal route continuous. Intrathecal (IT) medication:   Morphine.    . pantoprazole (PROTONIX) 40 MG tablet Take 1 tablet (40 mg total) by mouth 2 (two) times daily. 30 tablet 11  . sucralfate (CARAFATE) 1 GM/10ML suspension Take 10 mLs (1 g total) by mouth every 6 (six) hours. 1200 mL 1  . XARELTO 20 MG TABS tablet TAKE 1 TABLET (20 MG TOTAL) BY MOUTH DAILY WITH SUPPER. (Patient taking differently: Take 20 mg by mouth daily.) 90 tablet 3  . ALPRAZolam (XANAX) 1 MG tablet TAKE 1 TABLET BY MOUTH THREE TIMES A DAY AS NEEDED FOR ANXIETY (Patient taking differently: Take 1 mg by mouth 3 (three) times daily as needed for anxiety.) 90 tablet 0    Allergies as of 12/18/2020 - Review Complete 12/18/2020  Allergen Reaction Noted  . Ceftriaxone Hives 08/06/2011  . Hydrocodone Swelling 08/06/2011    Family History  Problem Relation Age of Onset  . Hypertension Mother   .  Diabetes Brother   . Irritable bowel syndrome Sister   . Cancer Maternal Aunt        ovarian  . Colon cancer Neg Hx     Social History   Socioeconomic History  . Marital status: Married    Spouse name: Not on file  . Number of children: 2  . Years of education: Not on file  . Highest education level: Not on file  Occupational History  . Occupation: Electrician   Tobacco Use  . Smoking status: Current Every Day Smoker    Packs/day: 0.50    Years: 30.00    Pack years: 15.00    Types: Cigarettes  . Smokeless tobacco: Never Used  Vaping Use  . Vaping Use: Never used  Substance and Sexual Activity  . Alcohol use: No  . Drug use: Yes    Types: Marijuana    Comment: occasional. Last used: last night.   . Sexual activity: Not Currently  Other Topics Concern  .   Not on file  Social History Narrative   6 caffeine drinks daily    08-11-18  Unable to ask abuse questions wife with him today.   Social Determinants of Health   Financial Resource Strain: Not on file  Food Insecurity: Not on file  Transportation Needs: Not on file  Physical Activity: Not on file  Stress: Not on file  Social Connections: Not on file  Intimate Partner Violence: Not on file    Review of Systems: All systems reviewed and negative except where noted in HPI.  OBJECTIVE:    Physical Exam: Vital signs in last 24 hours: Temp:  [97.7 F (36.5 C)-98.1 F (36.7 C)] 98.1 F (36.7 C) (12/28 2038) Pulse Rate:  [39-116] 74 (12/29 0730) Resp:  [11-21] 15 (12/29 0730) BP: (124-173)/(74-105) 134/85 (12/29 0730) SpO2:  [84 %-100 %] 91 % (12/29 0730) Weight:  [73.9 kg] 73.9 kg (12/28 1432)   General:   Alert  Thin male in NAD Psych:  Pleasant, cooperative. Normal mood and affect. Eyes:  Pupils equal, sclera clear, no icterus.   Conjunctiva pink. Ears:  Normal auditory acuity. Nose:  No deformity, discharge,  or lesions. Neck:  Supple; no masses Lungs:  Scattered Rhonchi.  Heart:  Regular rate and  rhythm; edematous left hand, + bilateral lower extremity edema Abdomen:  Soft, non-distended, nontender, BS active, no palp mass   Rectal:  Deferred  Msk:  Symmetrical without gross deformities. . Neurologic:  Alert and  oriented x4;  grossly normal neurologically. Skin:  Intact without significant lesions or rashes.  Filed Weights   12/18/20 1432  Weight: 73.9 kg     Scheduled inpatient medications  Intake/Output from previous day: 12/28 0701 - 12/29 0700 In: 1575 [Blood:1575] Out: -  Intake/Output this shift: No intake/output data recorded.   Lab Results: Recent Labs    12/18/20 0845 12/18/20 1454 12/19/20 0350  WBC 9.5 6.9 6.0  HGB 6.2 Repeated and verified X2.* 5.2* 7.2*  HCT 20.4* 18.5* 23.7*  PLT 164.0 135* 102*   BMET Recent Labs    12/18/20 1454  NA 142  K 4.0  CL 110  CO2 26  GLUCOSE 123*  BUN 27*  CREATININE 1.42*  CALCIUM 7.6*   LFT Recent Labs    12/18/20 1454  PROT 5.2*  ALBUMIN 2.2*  AST 14*  ALT 14  ALKPHOS 59  BILITOT 0.3   PT/INR Recent Labs    12/18/20 1630  LABPROT 23.8*  INR 2.2*   Hepatitis Panel No results for input(s): HEPBSAG, HCVAB, HEPAIGM, HEPBIGM in the last 72 hours.   . CBC Latest Ref Rng & Units 12/19/2020 12/18/2020 12/18/2020  WBC 4.0 - 10.5 K/uL 6.0 6.9 9.5  Hemoglobin 13.0 - 17.0 g/dL 7.2(L) 5.2(LL) 6.2 Repeated and verified X2.(LL)  Hematocrit 39.0 - 52.0 % 23.7(L) 18.5(L) 20.4(LL)  Platelets 150 - 400 K/uL 102(L) 135(L) 164.0    . CMP Latest Ref Rng & Units 12/18/2020 11/20/2020 09/21/2020  Glucose 70 - 99 mg/dL 123(H) 107(H) 106(H)  BUN 6 - 20 mg/dL 27(H) 18 18  Creatinine 0.61 - 1.24 mg/dL 1.42(H) 1.22 1.45(H)  Sodium 135 - 145 mmol/L 142 142 138  Potassium 3.5 - 5.1 mmol/L 4.0 4.3 4.6  Chloride 98 - 111 mmol/L 110 113(H) 112(H)  CO2 22 - 32 mmol/L 26 24 23  Calcium 8.9 - 10.3 mg/dL 7.6(L) 7.7(L) 7.8(L)  Total Protein 6.5 - 8.1 g/dL 5.2(L) 5.6(L) 5.8(L)  Total Bilirubin 0.3 - 1.2 mg/dL 0.3  0.2(L) <0.2(L)    Alkaline Phos 38 - 126 U/L 59 76 76  AST 15 - 41 U/L 14(L) 18 15  ALT 0 - 44 U/L _0 Studies/Results: No results found.  Active Problems:   Renal cancer ([place])   Bone metastasis ([place])   Metastasis to lung [facility])   Cancer related pain   Pulmonary embolus, left (HCC)   Acute GI bleeding   Acute blood loss anemia   GI bleed    Tye Savoy, NP-C @  12/19/2020, 8:48 AM

## 2020-12-19 NOTE — Progress Notes (Signed)
[facility]  PROGRESS NOTE  Keith [name] DHW:[id number] DOB: Sep 20, 1966 DOA: 12/18/2020 PCP: [name], MD   Brief HPI:   54 year old male with history of stage IV renal cell carcinoma with mets to lung and bone, diagnosed in 2003 s/p radical nephrectomy, s/p radiotherapy, on Xarelto, anemia chronic disease, GI bleed from gastritis, last EGD in March 2021 went to see oncology and was found to have hemoglobin of 6 he was sent to ED for further evaluation. 2 units PRBC were ordered and GI was consulted.    Subjective   Patient seen and examined, denies any complaints. No abdominal pain.   Assessment/Plan:     1. Acute blood loss anemia/GI bleed-patient with hemoglobin usually between 9-11, he presented with hemoglobin of 5.2. 2 units of PRBC ordered. GI was consulted. Plan to undergo EGD today. 2. Metastatic renal cell carcinoma with mets to pancreas, lung and bone.-Oral chemo on hold for now. Management as per oncology as outpatient. 3. Metastatic lesions to bone-continue with Dilaudid for pain. 4. History of pulmonary embolism-Xarelto is currently on hold due to GI bleed 5. Bilateral lower extremity edema-secondary to hypoalbuminemia and anasarca. 6. CKD stage II-creatinine at baseline 7. Hypertension-start hydralazine 5 mg IV every 6 hours as needed for BP more than 160/100.     COVID-19 Labs  No results for input(s): DDIMER, FERRITIN, LDH, CRP in the last 72 hours.  Lab Results  Component Value Date   SARSCOV2NAA NEGATIVE 12/18/2020   [place] NEGATIVE 02/28/2020   [place] Not Detected 12/15/2019   [place] NEGATIVE 08/28/2019     Scheduled medications:   . gabapentin  300 mg Oral TID  . peg 3350 powder  1 kit Oral Once         CBG: No results for input(s): GLUCAP in the last 168 hours.  SpO2: 99 % O2 Flow Rate (L/min): 6 L/min    CBC: Recent Labs  Lab 12/18/20 0017 12/18/20 0845 12/18/20 1454 12/19/20 0350 12/19/20 1300  WBC   --  9.5 6.9 6.0  --   NEUTROABS  --  7.1  --   --   --   HGB 6.8* 6.2 Repeated and verified X2.* 5.2* 7.2* 6.9*  HCT 22.8* 20.4* 18.5* 23.7* 23.2*  MCV  --  103.6* 112.8* 100.0  --   PLT  --  164.0 135* 102*  --     Basic Metabolic Panel: Recent Labs  Lab 12/18/20 1454  NA 142  K 4.0  CL 110  CO2 26  GLUCOSE 123*  BUN 27*  CREATININE 1.42*  CALCIUM 7.6*     Liver Function Tests: Recent Labs  Lab 12/18/20 1454  AST 14*  ALT 14  ALKPHOS 59  BILITOT 0.3  PROT 5.2*  ALBUMIN 2.2*     Antibiotics: Anti-infectives (From admission, onward)   None       DVT prophylaxis: SCDs  Code Status: Full code  Family Communication: No family at bedside   Consultants:  Gastroenterology  Procedures:      Objective   Vitals:   12/19/20 1530 12/19/20 1540 12/19/20 1642 12/19/20 1708  BP: (!) 178/101 (!) 176/99 (!) 165/92 (!) 179/101  Pulse: (!) 59 65 61 60  Resp: _0 Temp:   (!) 97.5 F (36.4 C) 97.8 F (36.6 C)  TempSrc:   Oral Oral  SpO2: 96% 99% 98% 99%  Weight:   83 kg   Height:        Intake/Output Summary (Last  24 hours) at 12/19/2020 7564 Last data filed at 12/19/2020 1448 Gross per 24 hour  Intake 2050 ml  Output --  Net 2050 ml    12/27 1901 - 12/29 0700 In: 1575  Out: -   Filed Weights   12/18/20 1432 12/19/20 1339 12/19/20 1642  Weight: 73.9 kg 80.3 kg 83 kg    Physical Examination:    General-appears in no acute distress  Heart-S1-S2, regular, no murmur auscultated  Lungs-clear to auscultation bilaterally, no wheezing or crackles auscultated  Abdomen-soft, nontender, no organomegaly  Extremities-bilateral 1+ edema lower extremities  Neuro-alert, oriented x3, no focal deficit noted   Status is: Inpatient  Dispo: The patient is from: Home              Anticipated d/c is to: Home              Anticipated d/c date is: 12/21/2020              Patient currently not medically stable for discharge  Barrier to  discharge-ongoing evaluation for anemia/GI bleed        Data Reviewed:   Recent Results (from the past 240 hour(s))  SARS CORONAVIRUS 2 (TAT 6-24 HRS) Nasopharyngeal Nasopharyngeal Swab     Status: None   Collection Time: 12/18/20  4:00 PM   Specimen: Nasopharyngeal Swab  Result Value Ref Range Status   SARS Coronavirus 2 NEGATIVE NEGATIVE Final    Comment: (NOTE) SARS-CoV-2 target nucleic acids are NOT DETECTED.  The SARS-CoV-2 RNA is generally detectable in upper and lower respiratory specimens during the acute phase of infection. Negative results do not preclude SARS-CoV-2 infection, do not rule out co-infections with other pathogens, and should not be used as the sole basis for treatment or other patient management decisions. Negative results must be combined with clinical observations, patient history, and epidemiological information. The expected result is Negative.  Fact Sheet for Patients: SugarRoll.be  Fact Sheet for Healthcare Providers: https://www.woods-mathews.com/  This test is not yet approved or cleared by the [place] FDA and  has been authorized for detection and/or diagnosis of SARS-CoV-2 by FDA under an Emergency Use Authorization (EUA). This EUA will remain  in effect (meaning this test can be used) for the duration of the COVID-19 declaration under Se ction 564(b)(1) of the Act, 21 U.S.C. section 360bbb-3(b)(1), unless the authorization is terminated or revoked sooner.  Performed at [facility], [place] [address], [place], [place] [address]         [facility] If 7PM-7AM, please contact night-coverage at [email or web address], Office  [phone]   12/19/2020, 6:48 PM  LOS: 1 day

## 2020-12-19 NOTE — ED Notes (Signed)
Hgb 6.9 writer called Endo and updated Stacey with resullts

## 2020-12-20 ENCOUNTER — Other Ambulatory Visit: Payer: Self-pay

## 2020-12-20 ENCOUNTER — Inpatient Hospital Stay (HOSPITAL_COMMUNITY): Payer: Medicare Other | Admitting: Certified Registered Nurse Anesthetist

## 2020-12-20 ENCOUNTER — Encounter (HOSPITAL_COMMUNITY): Payer: Self-pay | Admitting: Internal Medicine

## 2020-12-20 ENCOUNTER — Encounter (HOSPITAL_COMMUNITY)
Admission: EM | Disposition: A | Discharge status: Home / Self Care | Payer: Self-pay | Source: Home / Self Care | Attending: Family Medicine

## 2020-12-20 DIAGNOSIS — K5520 Angiodysplasia of colon without hemorrhage: Secondary | ICD-10-CM

## 2020-12-20 DIAGNOSIS — K6289 Other specified diseases of anus and rectum: Secondary | ICD-10-CM

## 2020-12-20 DIAGNOSIS — K921 Melena: Secondary | ICD-10-CM

## 2020-12-20 HISTORY — PX: HEMOSTASIS CLIP PLACEMENT: SHX6857

## 2020-12-20 HISTORY — PX: HOT HEMOSTASIS: SHX5433

## 2020-12-20 HISTORY — PX: COLONOSCOPY WITH PROPOFOL: SHX5780

## 2020-12-20 HISTORY — PX: BIOPSY: SHX5522

## 2020-12-20 LAB — CBC
HCT: 27.8 % — ABNORMAL LOW (ref 39.0–52.0)
Hemoglobin: 8.5 g/dL — ABNORMAL LOW (ref 13.0–17.0)
MCH: 29.7 pg (ref 26.0–34.0)
MCHC: 30.6 g/dL (ref 30.0–36.0)
MCV: 97.2 fL (ref 80.0–100.0)
Platelets: 109 10*3/uL — ABNORMAL LOW (ref 150–400)
RBC: 2.86 MIL/uL — ABNORMAL LOW (ref 4.22–5.81)
RDW: 22.5 % — ABNORMAL HIGH (ref 11.5–15.5)
WBC: 4.6 10*3/uL (ref 4.0–10.5)
nRBC: 0 % (ref 0.0–0.2)

## 2020-12-20 LAB — BPAM RBC
Blood Product Expiration Date: 202201232359
Blood Product Expiration Date: 202201232359
Blood Product Expiration Date: 202201262359
ISSUE DATE / TIME: 202112281711
ISSUE DATE / TIME: 202112282011
ISSUE DATE / TIME: 202112291647
Unit Type and Rh: 600
Unit Type and Rh: 600
Unit Type and Rh: 600

## 2020-12-20 LAB — BASIC METABOLIC PANEL
Anion gap: 6 (ref 5–15)
BUN: 22 mg/dL — ABNORMAL HIGH (ref 6–20)
CO2: 25 mmol/L (ref 22–32)
Calcium: 7.5 mg/dL — ABNORMAL LOW (ref 8.9–10.3)
Chloride: 108 mmol/L (ref 98–111)
Creatinine, Ser: 1.23 mg/dL (ref 0.61–1.24)
GFR, Estimated: >60 mL/min (ref >60)
Glucose, Bld: 85 mg/dL (ref 70–99)
Potassium: 4.1 mmol/L (ref 3.5–5.1)
Sodium: 139 mmol/L (ref 135–145)

## 2020-12-20 LAB — TYPE AND SCREEN
ABO/RH(D): A NEG
Antibody Screen: NEGATIVE
Unit division: 0
Unit division: 0
Unit division: 0

## 2020-12-20 LAB — PREPARE RBC (CROSSMATCH)

## 2020-12-20 LAB — SURGICAL PATHOLOGY

## 2020-12-20 LAB — HEMOGLOBIN AND HEMATOCRIT, BLOOD
HCT: 29.8 % — ABNORMAL LOW (ref 39.0–52.0)
HCT: 28.2 % — ABNORMAL LOW (ref 39.0–52.0)
Hemoglobin: 9.1 g/dL — ABNORMAL LOW (ref 13.0–17.0)
Hemoglobin: 8.6 g/dL — ABNORMAL LOW (ref 13.0–17.0)

## 2020-12-20 LAB — HIV ANTIBODY (ROUTINE TESTING W REFLEX): HIV Screen 4th Generation wRfx: NONREACTIVE

## 2020-12-20 SURGERY — COLONOSCOPY WITH PROPOFOL
Anesthesia: Monitor Anesthesia Care

## 2020-12-20 MED ORDER — PROPOFOL 500 MG/50ML IV EMUL
INTRAVENOUS | Status: DC | PRN
  Administered 2020-12-20 (×2): 30 mg via INTRAVENOUS

## 2020-12-20 MED ORDER — LACTATED RINGERS IV SOLN
INTRAVENOUS | Status: DC
  Administered 2020-12-20: 1000 mL via INTRAVENOUS

## 2020-12-20 MED ORDER — PROPOFOL 10 MG/ML IV BOLUS
INTRAVENOUS | Status: AC
  Filled 2020-12-20: qty 20

## 2020-12-20 MED ORDER — PROPOFOL 500 MG/50ML IV EMUL
INTRAVENOUS | Status: DC | PRN
  Administered 2020-12-20: 150 ug/kg/min via INTRAVENOUS

## 2020-12-20 SURGICAL SUPPLY — 22 items

## 2020-12-20 NOTE — Op Note (Signed)
[facility] Patient Name: Keith [name] Procedure Date: 12/20/2020 MRN: [id number] Attending MD: Gatha Mayer , MD Date of Birth: 11-05-1966 CSN: [id number] Age: 54 Admit Type: Outpatient Procedure:                Colonoscopy Indications:              Hematochezia Providers:                Gatha Mayer, MD, Cleda Daub, RN, Janee Morn, Technician Referring MD:             [name], Mathis Dad Shadad Medicines:                Propofol per Anesthesia, Monitored Anesthesia Care Complications:            No immediate complications. Estimated Blood Loss:     Estimated blood loss: none. Procedure:                Pre-Anesthesia Assessment:                           - Prior to the procedure, a History and Physical                            was performed, and patient medications and                            allergies were reviewed. The patient's tolerance of                            previous anesthesia was also reviewed. The risks                            and benefits of the procedure and the sedation                            options and risks were discussed with the patient.                            All questions were answered, and informed consent                            was obtained. Prior Anticoagulants: The patient                            last took Xarelto (rivaroxaban) 2 days prior to the                            procedure. ASA Grade Assessment: III - A patient                            with severe systemic disease. After reviewing the  risks and benefits, the patient was deemed in                            satisfactory condition to undergo the procedure.                           After obtaining informed consent, the colonoscope                            was passed under direct vision. Throughout the                            procedure, the patient's blood pressure, pulse, and                             oxygen saturations were monitored continuously. The                            CF-HQ190L ([id number]) Olympus colonoscope was                            introduced through the anus and advanced to the the                            terminal ileum, with identification of the                            appendiceal orifice and IC valve. The colonoscopy                            was performed without difficulty. The patient                            tolerated the procedure well. The quality of the                            bowel preparation was adequate. The terminal ileum,                            ileocecal valve, appendiceal orifice, and rectum                            were photographed. Scope In: 8:53:12 AM Scope Out: 9:32:20 AM Scope Withdrawal Time: 0 hours 30 minutes 37 seconds  Total Procedure Duration: 0 hours 39 minutes 8 seconds  Findings:      The perianal examination was normal.      The digital rectal exam findings include decreased sphincter tone.      A localized area of mucosa in the terminal ileum was altered vascular.       Biopsies were taken with a cold forceps for histology. Verification of       patient identification for the specimen was done. Estimated blood loss       was minimal. Coagulation for destruction of remaining portion of lesion       using  argon plasma was successful. Estimated blood loss: none.      A single small angiodysplastic lesion without bleeding was found in the       ascending colon. Coagulation for bleeding prevention using argon plasma       was successful. Estimated blood loss: none. To prevent bleeding       post-intervention, one hemostatic clip was successfully placed (MR       conditional). There was no bleeding during, or at the end, of the       procedure. Estimated blood loss: none.      The exam was otherwise without abnormality on direct and retroflexion       views. Impression:               - Decreased  sphincter tone found on digital rectal                            exam.                           - Altered vascular mucosa in the terminal ileum.                            Biopsied. Treated with argon plasma coagulation                            (APC).                           - A single non-bleeding colonic angiodysplastic                            lesion. Treated with argon plasma coagulation                            (APC). Clip (MR conditional) was placed.                           - The examination was otherwise normal on direct                            and retroflexion views.                           NOTE THAT HE IS ON CABOMETYX AND ADVERSE REACTION                            OF THIS IS HEMORRHAGE, GI PERFORATION, ARTERIAL                            RUPTURE - NOT SURE OF CONTEXT OR MECHANISM BUT HE                            IS ON THIS MEDICATION THOUGH MUST REMEMBER IT IS                            LIFE-SAVING FOR HIM  Moderate Sedation:      Not Applicable - Patient had care per Anesthesia. Recommendation:           - Return patient to hospital ward for ongoing care.                           - Resume previous diet.                           - Consider restart Xarelto tomorrow depending upon                            clinical course.                           I think a capsule endoscopy (repeat) might be                            reasonable but not now as the intestinal fluid was                            still dark.                           If I were to do a capsule would do off po iron and                            with a full prep and back on xarelto.                           this can wait til outpatient barring ongoing                            bleeding. Procedure Code(s):        --- Professional ---                           [phone], Colonoscopy, flexible; with ablation of                            tumor(s), polyp(s), or other lesion(s) (includes                             pre- and post-dilation and guide wire passage, when                            performed) Diagnosis Code(s):        --- Professional ---                           K62.89, Other specified diseases of anus and rectum                           K63.89, Other specified diseases of intestine  K55.20, Angiodysplasia of colon without hemorrhage                           K92.1, Melena (includes Hematochezia) CPT copyright 2019 American Medical Association. All rights reserved. The codes documented in this report are preliminary and upon coder review may  be revised to meet current compliance requirements. Gatha Mayer, MD 12/20/2020 9:56:51 AM This report has been signed electronically. Number of Addenda: 0

## 2020-12-20 NOTE — Interval H&P Note (Signed)
History and Physical Interval Note:  12/20/2020 8:36 AM  Keith [name]  has presented today for surgery, with the diagnosis of gi bleed, negative EGD.  The various methods of treatment have been discussed with the patient and family. After consideration of risks, benefits and other options for treatment, the patient has consented to  Procedure(s): COLONOSCOPY WITH PROPOFOL (N/A) as a surgical intervention.  The patient's history has been reviewed, patient examined, no change in status, stable for surgery.  I have reviewed the patient's chart and labs.  Questions were answered to the patient's satisfaction.     Silvano Rusk

## 2020-12-20 NOTE — Anesthesia Postprocedure Evaluation (Signed)
Anesthesia Post Note  Patient: Keith [name]  Procedure(s) Performed: COLONOSCOPY WITH PROPOFOL (N/A ) BIOPSY HOT HEMOSTASIS (ARGON PLASMA COAGULATION/BICAP) (N/A ) HEMOSTASIS CLIP PLACEMENT     Patient location during evaluation: Endoscopy Anesthesia Type: MAC Level of consciousness: awake and alert Pain management: pain level controlled Vital Signs Assessment: post-procedure vital signs reviewed and stable Respiratory status: spontaneous breathing, nonlabored ventilation and respiratory function stable Cardiovascular status: blood pressure returned to baseline and stable Postop Assessment: no apparent nausea or vomiting Anesthetic complications: no   No complications documented.  Last Vitals:  Vitals:   12/20/20 0950 12/20/20 1000  BP: 103/61 129/87  Pulse: 85 (!) 42  Resp: 20 13  Temp:    SpO2: 95% (!) 69%    Last Pain:  Vitals:   12/20/20 1000  TempSrc:   PainSc: 0-No pain                 Lidia Collum

## 2020-12-20 NOTE — Progress Notes (Signed)
Blood bank inquired as to whether I needed the unit of blood ordered right at 1900 for transfusion. Hgb was checked at 2300 and was 8.8, up from 5.2 at lowest point after 3 units total transfused. Pt has been completing bowel prep for colonoscopy and we have not noticed any frank bleeding; stool has varied between brown and darker colored. Spoke with on call provider M. Sharlet Salina NP, and she stated to hold off on transfusing additional unit at this time given this information. Will monitor pt and inform her of any changes. Hortencia Conradi RN

## 2020-12-20 NOTE — Anesthesia Preprocedure Evaluation (Signed)
Anesthesia Evaluation  Patient identified by MRN, date of birth, ID band Patient awake    Reviewed: Allergy & Precautions, H&P , NPO status , Patient's Chart, lab work & pertinent test results  Airway Mallampati: III  TM Distance: >3 FB Neck ROM: Full    Dental  (+) Edentulous Upper, Edentulous Lower   Pulmonary Current Smoker and Patient abstained from smoking., PE Lung mets from primary renal CA   Pulmonary exam normal        Cardiovascular hypertension, Normal cardiovascular exam     Neuro/Psych Depression negative neurological ROS     GI/Hepatic (+)     substance abuse  , GI bleed   Endo/Other  diabetes  Renal/GU ARFRenal diseaseH/o Metastatic Renal CA  negative genitourinary   Musculoskeletal   Abdominal   Peds  Hematology  (+) Blood dyscrasia, anemia , Hgb 8.5, plts 109 Xarelto   Anesthesia Other Findings   Reproductive/Obstetrics negative OB ROS                             Anesthesia Physical  Anesthesia Plan  ASA: III  Anesthesia Plan: MAC   Post-op Pain Management:    Induction: Intravenous  PONV Risk Score and Plan: 0 and Propofol infusion  Airway Management Planned: Nasal Cannula  Additional Equipment: None  Intra-op Plan:   Post-operative Plan:   Informed Consent: I have reviewed the patients History and Physical, chart, labs and discussed the procedure including the risks, benefits and alternatives for the proposed anesthesia with the patient or authorized representative who has indicated his/her understanding and acceptance.       Plan Discussed with:   Anesthesia Plan Comments:         Anesthesia Quick Evaluation

## 2020-12-20 NOTE — Progress Notes (Signed)
Pt transferred to floor, bedside report given to [place], Therapist, sports, safety maintained

## 2020-12-20 NOTE — Progress Notes (Signed)
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 2 units PRBC were ordered and GI was consulted.    Subjective   Patient seen and examined, underwent colonoscopy today angiodysplastic lesion was noted in colon as well as terminal ileum.  Treated with argon plasma coagulation.  EGD  done yesterday only showed mild gastritis.   Assessment/Plan:     1. Acute blood loss anemia/GI bleed-patient with hemoglobin usually between 9-11, he presented with hemoglobin of 5.2. 2 units of PRBC ordered.  Hemoglobin is 9.1 this morning.  GI was consulted.  Patient underwent EGD and colonoscopy.  EGD was unremarkable except for mild gastritis.  Colonoscopy done today shows angiodysplastic lesion in the terminal ileum as well as angiodysplastic lesion noted colon which was treated with APC.  Patient is on chemotherapy Cabometyx which can cause GI hemorrhage.  Patient will discuss with his oncologist regarding this medication. 2. Metastatic renal cell carcinoma with mets to pancreas, lung and bone.-Oral chemo on hold for now. Management as per oncology as outpatient.  Patient is on Cabometyx, will discuss with oncology regarding switching this medication as outpatient 3. Metastatic lesions to bone-continue with Dilaudid for pain. 4. History of pulmonary embolism-Xarelto is currently on hold due to GI bleed.  Will restart Xarelto from tomorrow morning as per GI recommendation. 5. Bilateral lower extremity edema-secondary to hypoalbuminemia and anasarca. 6. CKD stage II-creatinine at  baseline 7. Hypertension-start hydralazine 5 mg IV every 6 hours as needed for BP more than 160/100.     COVID-19 Labs  No results for input(s): DDIMER, FERRITIN, LDH, CRP in the last 72 hours.  Lab Results  Component Value Date   SARSCOV2NAA NEGATIVE 12/18/2020   [place] NEGATIVE 02/28/2020   [place] Not Detected 12/15/2019   [place] NEGATIVE 08/28/2019     Scheduled medications:   . sodium chloride   Intravenous Once  . furosemide  20 mg Intravenous Once  . gabapentin  300 mg Oral TID         CBG: No results for input(s): GLUCAP in the last 168 hours.  SpO2: 97 % O2 Flow Rate (L/min): 6 L/min    CBC: Recent Labs  Lab 12/18/20 0845 12/18/20 1454 12/19/20 0350 12/19/20 1300 12/19/20 2254 12/20/20 0559 12/20/20 1500  WBC 9.5 6.9 6.0  --   --  4.6  --   NEUTROABS 7.1  --   --   --   --   --   --   HGB 6.2 Repeated and verified X2.* 5.2* 7.2* 6.9* 8.8* 8.5* 9.1*  HCT 20.4* 18.5* 23.7* 23.2* 29.3* 27.8* 29.8*  MCV 103.6* 112.8* 100.0  --   --  97.2  --   PLT 164.0 135* 102*  --   --  109*  --     Basic Metabolic Panel: Recent Labs  Lab 12/18/20 1454 12/20/20 0559  NA 142 139  K 4.0 4.1  CL 110 108  CO2 26 25  GLUCOSE 123* 85  BUN 27* 22*  CREATININE 1.42* 1.23  CALCIUM 7.6* 7.5*     Liver Function  Tests: Recent Labs  Lab 12/18/20 1454  AST 14*  ALT 14  ALKPHOS 59  BILITOT 0.3  PROT 5.2*  ALBUMIN 2.2*     Antibiotics: Anti-infectives (From admission, onward)   None       DVT prophylaxis: SCDs  Code Status: Full code  Family Communication: No family at bedside   Consultants:  Gastroenterology  Procedures:      Objective   Vitals:   12/20/20 0950 12/20/20 1000 12/20/20 1010 12/20/20 1034  BP: 103/61 129/87 (!) 143/90 (!) 148/89  Pulse: 85 (!) 42 68 60  Resp: 20 13 (!) 23 20  Temp:    (!) 97.5 F (36.4 C)  TempSrc:    Oral  SpO2: 95% (!) 69% 98% 97%  Weight:      Height:        Intake/Output  Summary (Last 24 hours) at 12/20/2020 1659 Last data filed at 12/20/2020 0917 Gross per 24 hour  Intake 2648.01 ml  Output --  Net 2648.01 ml    12/28 1901 - 12/30 0700 In: 3898 [P.O.:1300; I.V.:958] Out: Danley Danker Weights   12/18/20 1432 12/19/20 1339 12/19/20 1642  Weight: 73.9 kg 80.3 kg 83 kg    Physical Examination:    General-appears in no acute distress  Heart-S1-S2, regular, no murmur auscultated  Lungs-clear to auscultation bilaterally, no wheezing or crackles auscultated  Abdomen-soft, nontender, no organomegaly  Extremities-no edema in the lower extremities  Neuro-alert, oriented x3, no focal deficit noted   Status is: Inpatient  Dispo: The patient is from: Home              Anticipated d/c is to: Home              Anticipated d/c date is: 12/21/2020              Patient currently not medically stable for discharge  Barrier to discharge-ongoing evaluation for anemia/GI bleed        Data Reviewed:   Recent Results (from the past 240 hour(s))  SARS CORONAVIRUS 2 (TAT 6-24 HRS) Nasopharyngeal Nasopharyngeal Swab     Status: None   Collection Time: 12/18/20  4:00 PM   Specimen: Nasopharyngeal Swab  Result Value Ref Range Status   SARS Coronavirus 2 NEGATIVE NEGATIVE Final    Comment: (NOTE) SARS-CoV-2 target nucleic acids are NOT DETECTED.  The SARS-CoV-2 RNA is generally detectable in upper and lower respiratory specimens during the acute phase of infection. Negative results do not preclude SARS-CoV-2 infection, do not rule out co-infections with other pathogens, and should not be used as the sole basis for treatment or other patient management decisions. Negative results must be combined with clinical observations, patient history, and epidemiological information. The expected result is Negative.  Fact Sheet for Patients: SugarRoll.be  Fact Sheet for Healthcare  Providers: https://www.woods-mathews.com/  This test is not yet approved or cleared by the [place] FDA and  has been authorized for detection and/or diagnosis of SARS-CoV-2 by FDA under an Emergency Use Authorization (EUA). This EUA will remain  in effect (meaning this test can be used) for the duration of the COVID-19 declaration under Se ction 564(b)(1) of the Act, 21 U.S.C. section 360bbb-3(b)(1), unless the authorization is terminated or revoked sooner.  Performed at [facility], [place] [address] [place], [place] [address]         [facility] If 7PM-7AM, please contact night-coverage at [email or web address], Office  [phone]  12/20/2020, 4:59 PM  LOS: 2 days

## 2020-12-20 NOTE — Transfer of Care (Signed)
Immediate Anesthesia Transfer of Care Note  Patient: ARKEL CARTWRIGHT  Procedure(s) Performed: COLONOSCOPY WITH PROPOFOL (N/A ) BIOPSY HOT HEMOSTASIS (ARGON PLASMA COAGULATION/BICAP) (N/A ) HEMOSTASIS CLIP PLACEMENT  Patient Location: PACU  Anesthesia Type:MAC  Level of Consciousness: sedated, patient cooperative and responds to stimulation  Airway & Oxygen Therapy: Patient Spontanous Breathing and Patient connected to face mask oxygen  Post-op Assessment: Report given to RN and Post -op Vital signs reviewed and stable  Post vital signs: Reviewed and stable  Last Vitals:  Vitals Value Taken Time  BP 90/45 12/20/20 0941  Temp    Pulse 86 12/20/20 0941  Resp 13 12/20/20 0941  SpO2 98 % 12/20/20 0941  Vitals shown include unvalidated device data.  Last Pain:  Vitals:   12/20/20 0804  TempSrc:   PainSc: 0-No pain         Complications: No complications documented.

## 2020-12-21 ENCOUNTER — Encounter (HOSPITAL_COMMUNITY): Payer: Self-pay | Admitting: Gastroenterology

## 2020-12-21 ENCOUNTER — Other Ambulatory Visit: Payer: Self-pay | Admitting: Nurse Practitioner

## 2020-12-21 DIAGNOSIS — C7951 Secondary malignant neoplasm of bone: Secondary | ICD-10-CM

## 2020-12-21 DIAGNOSIS — K5521 Angiodysplasia of colon with hemorrhage: Secondary | ICD-10-CM | POA: Diagnosis not present

## 2020-12-21 DIAGNOSIS — K922 Gastrointestinal hemorrhage, unspecified: Secondary | ICD-10-CM

## 2020-12-21 LAB — CBC
HCT: 28.8 % — ABNORMAL LOW (ref 39.0–52.0)
Hemoglobin: 8.6 g/dL — ABNORMAL LOW (ref 13.0–17.0)
MCH: 29.8 pg (ref 26.0–34.0)
MCHC: 29.9 g/dL — ABNORMAL LOW (ref 30.0–36.0)
MCV: 99.7 fL (ref 80.0–100.0)
Platelets: 114 10*3/uL — ABNORMAL LOW (ref 150–400)
RBC: 2.89 MIL/uL — ABNORMAL LOW (ref 4.22–5.81)
RDW: 21.8 % — ABNORMAL HIGH (ref 11.5–15.5)
WBC: 5.4 10*3/uL (ref 4.0–10.5)
nRBC: 0 % (ref 0.0–0.2)

## 2020-12-21 LAB — VITAMIN B12: Vitamin B-12: 99 pg/mL — ABNORMAL LOW (ref 180–914)

## 2020-12-21 MED ORDER — PANTOPRAZOLE SODIUM 40 MG PO TBEC
40.0000 mg | DELAYED_RELEASE_TABLET | Freq: Every day | ORAL | Status: DC
  Administered 2020-12-21: 40 mg via ORAL
  Filled 2020-12-21: qty 1

## 2020-12-21 MED ORDER — PANTOPRAZOLE SODIUM 40 MG PO TBEC: 40.0000 mg | DELAYED_RELEASE_TABLET | Freq: Every day | ORAL | 3 refills | Status: DC

## 2020-12-21 MED ORDER — AMLODIPINE BESYLATE 5 MG PO TABS: 5.0000 mg | ORAL_TABLET | Freq: Every day | ORAL | 11 refills | Status: DC

## 2020-12-21 NOTE — Progress Notes (Addendum)
Progress Note  Chief Complaint:    GI bleed     ASSESSMENT / PLAN:    # 54 yo male with metastatic renal cell cancer presenting with GI bleed / symptomatic acute on chronic macrocytic anemia on Xarelto.  EGD showed only mild erythema in stomach. Colonoscopy revealed abnormal vascular mucosa in terminal ileum biopsied and also treated with APC. Of note, on Cabometyx which apparently has potential for adverse reaction of hemorrhage , GI perforation, arterial rupture. Patient plans to discuss with Dr. [name] -- Will stop PPI infusion. Has hx of reflux esophagitis so will keep on PO PPI.  --Hgb improved from 5.2 to 8.6 post 3 units of PRBs --Consider outpatient endo capsule study off po iron and with a full prep and ON xarelto. --I will put order in computer for CBC in one week at our office. Our office is closed today but will ask nurse to call him with a follow up appointment with me in a few weeks.    # Hx of Vitamin B12 deficiency. Based on home med list he isn't on B12 supplement. Unless getting injections somewhere?  Last B12 check in Aug was 163.  -- B12 level is pending.  if still low then needs repletion  # Hx of blood clots , ? PE --Can restart Xarelto tomorrow        SUBJECTIVE:    Feels fine. No further bleeding ( having BMs without blood)   OBJECTIVE:    Scheduled inpatient medications:  . sodium chloride   Intravenous Once  . furosemide  20 mg Intravenous Once  . gabapentin  300 mg Oral TID   Continuous inpatient infusions:  . pantoprozole (PROTONIX) infusion 8 mg/hr (12/21/20 0117)   PRN inpatient medications: acetaminophen **OR** acetaminophen, ALPRAZolam, hydrALAZINE, HYDROmorphone, ondansetron **OR** ondansetron (ZOFRAN) IV  Vital signs in last 24 hours: Temp:  [97.5 F (36.4 C)-97.9 F (36.6 C)] 97.9 F (36.6 C) (12/30 2013) Pulse Rate:  [42-85] 81 (12/30 2013) Resp:  [13-23] 16 (12/30 2013) BP: (91-156)/(61-97) 156/97 (12/30 2013) SpO2:  [69  %-100 %] 100 % (12/30 2013) Last BM Date: 12/20/20  Intake/Output Summary (Last 24 hours) at 12/21/2020 0939 Last data filed at 12/21/2020 0605 Gross per 24 hour  Intake --  Output 200 ml  Net -200 ml     Physical Exam:  . General: Alert, thin male in NAD . Heart:  Regular rate and rhythm. Pitting RLE edema and LUE edema. Mild edema of right arm. . Pulmonary: Normal respiratory effort . Abdomen: Soft, nondistended, nontender. Normal bowel sounds.  . Neurologic: Alert and oriented . Psych: Pleasant. Cooperative.   Filed Weights   12/18/20 1432 12/19/20 1339 12/19/20 1642  Weight: 73.9 kg 80.3 kg 83 kg    Intake/Output from previous day: 12/30 0701 - 12/31 0700 In: 800 [I.V.:800] Out: 200 [Urine:200] Intake/Output this shift: No intake/output data recorded.    Lab Results: Recent Labs    12/19/20 0350 12/19/20 1300 12/20/20 0559 12/20/20 1500 12/20/20 2243 12/21/20 0603  WBC 6.0  --  4.6  --   --  5.4  HGB 7.2*   < > 8.5* 9.1* 8.6* 8.6*  HCT 23.7*   < > 27.8* 29.8* 28.2* 28.8*  PLT 102*  --  109*  --   --  114*   < > = values in this interval not displayed.   BMET Recent Labs    12/18/20 1454 12/20/20 0559  NA 142 139  K 4.0  4.1  CL 110 108  CO2 26 25  GLUCOSE 123* 85  BUN 27* 22*  CREATININE 1.42* 1.23  CALCIUM 7.6* 7.5*   LFT Recent Labs    12/18/20 1454  PROT 5.2*  ALBUMIN 2.2*  AST 14*  ALT 14  ALKPHOS 59  BILITOT 0.3   PT/INR Recent Labs    12/18/20 1630  LABPROT 23.8*  INR 2.2*   Hepatitis Panel No results for input(s): HEPBSAG, HCVAB, HEPAIGM, HEPBIGM in the last 72 hours.  No results found.    Active Problems:   Renal cancer ([place])   Bone metastasis ([place])   Metastasis to lung [facility])   Cancer related pain   Pulmonary embolus, left ([facility])   Acute GI bleeding   Acute blood loss anemia   GI bleed   Angiodysplasia of colon with hemorrhage     LOS: 3 days   Tye Savoy ,NP 12/21/2020, 9:39 AM   Attending  physician's note   I have taken an interval history, reviewed the chart and examined the patient. I agree with the Advanced Practitioner's note, impression and recommendations.   No further bleeding EGD-mild gastritis. Colonoscopy-localized abnormal area in TI (biopsied), small AVM s/p APC.  No active bleeding. Plan is to perform VCE on Xarelto, off iron supplements with preparation as an outpt Trend CBC Has a follow-up appointment with our office. We will sign off for now.  Carmell Austria, MD [facility] [phone]

## 2020-12-21 NOTE — Discharge Summary (Signed)
Physician Discharge Summary  Keith [name] BHA:[id number] DOB: 11-20-1966 DOA: 12/18/2020  PCP: [name], MD  Admit date: 12/18/2020 Discharge date: 12/21/2020  Time spent: 50 minutes  Recommendations for Outpatient Follow-up:  1. Follow-up GI in 1 week 2. \Follow-up PCP in 2 weeks 3. 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 2 units PRBC were ordered and GI was consulted.   Hospital Course:  1. Acute blood loss anemia/GI bleed-patient with hemoglobin usually between 9-11, he presented with hemoglobin of 5.2. 2 units of PRBC ordered.  Hemoglobin is 8.6 this morning.  GI was consulted.  Patient underwent EGD and colonoscopy.  EGD was unremarkable except for mild gastritis.  Colonoscopy showed angiodysplastic lesion in the terminal ileum as well as angiodysplastic lesion noted in  colon which was treated with APC.  Patient is on chemotherapy Cabometyx which can cause GI hemorrhage.  Patient will discuss with his oncologist regarding this medication.  Patient will be discharged on Protonix 40 mg p.o. daily. 2. Metastatic renal cell carcinoma with mets to pancreas, lung and bone.-Oral chemo on hold for  now. Management as per oncology as outpatient.  Patient is on Cabometyx, will discuss with oncology regarding switching this medication as outpatient 3. Metastatic lesions to bone-continue with Dilaudid for pain. 4. History of pulmonary embolism-Xarelto is currently on hold due to GI bleed.  Will restart Xarelto from tomorrow 12/22/2020  5. Bilateral lower extremity edema-secondary to hypoalbuminemia and anasarca. 6. CKD stage II-creatinine at baseline 7. Hypertension-blood pressure labile, start amlodipine 5 mg daily.   Procedures:  EGD  Colonoscopy  Consultations:  Gastroenterology  Discharge Exam: Vitals:   12/20/20 1034 12/20/20 2013  BP: (!) 148/89 (!) 156/97  Pulse: 60 81  Resp: 20 16  Temp: (!) 97.5 F (36.4 C) 97.9 F (36.6 C)  SpO2: 97% 100%    General: Appears in no acute distress Cardiovascular: S1-S2, regular Respiratory: Clear to auscultation bilaterally  Discharge Instructions   Discharge Instructions    Diet - low sodium heart healthy   Complete by: As directed    Discharge instructions   Complete by: As directed    Start taking xarelto from tomorrow 12/22/20   Increase activity slowly   Complete by: As directed      Allergies as of 12/21/2020      Reactions   Ceftriaxone Hives   Hydrocodone Swelling      Medication List    STOP taking these medications   sucralfate 1 GM/10ML suspension Commonly known as: CARAFATE     TAKE these medications   acetaminophen 325 MG tablet Commonly known as: TYLENOL Take 650 mg by mouth  every 6 (six) hours as needed.   ALPRAZolam 1 MG tablet Commonly known as: XANAX TAKE 1 TABLET BY MOUTH THREE TIMES A DAY AS NEEDED FOR ANXIETY What changed: See the new instructions.   amLODipine 5 MG tablet Commonly known as: NORVASC Take 1 tablet (5 mg total) by mouth daily.   Cabometyx 60 MG tablet Generic drug: cabozantinib TAKE 1 TABLET (60 MG) BY  MOUTH ONE TIME DAILY WITH  AT LEAST 8 OZ OF WATER ON  AN EMPTY  STOMACH. DO NOT  EAT FOR 2 HOURS BEFORE OR 1 HOUR A What changed: See the new instructions.   calcium carbonate 500 MG chewable tablet Commonly known as: TUMS - dosed in mg elemental calcium Chew 1 tablet by mouth 3 (three) times daily as needed for indigestion or heartburn.   cetirizine 10 MG tablet Commonly known as: ZYRTEC Take 10 mg by mouth daily as needed for allergies.   ferrous sulfate 325 (65 FE) MG tablet TAKE 1 TABLET BY MOUTH EVERY DAY WITH BREAKFAST What changed: See the new instructions.   gabapentin 300 MG capsule Commonly known as: NEURONTIN TAKE 1 CAPSULE BY MOUTH THREE TIMES A DAY What changed: See the new instructions.   HYDROmorphone 8 MG tablet Commonly known as: DILAUDID Take 1 tablet (8 mg total) by mouth every 4 (four) hours as needed for severe pain. What changed: reasons to take this   hydroxypropyl methylcellulose / hypromellose 2.5 % ophthalmic solution Commonly known as: ISOPTO TEARS / GONIOVISC Place 1 drop into both eyes daily as needed for dry eyes.   ondansetron 4 MG tablet Commonly known as: ZOFRAN Take one tablet every morning.  Can take one tablet later in the day on an as needed basis What changed:   how much to take  how to take this  when to take this  additional instructions   PAIN MANAGEMENT INTRATHECAL (IT) PUMP 1 each by Intrathecal route continuous. Intrathecal (IT) medication:   Morphine.   pantoprazole 40 MG tablet Commonly known as: PROTONIX Take 1 tablet (40 mg total) by mouth daily. What changed: when to take this   Xarelto 20 MG Tabs tablet Generic drug: rivaroxaban TAKE 1 TABLET (20 MG TOTAL) BY MOUTH DAILY WITH SUPPER. What changed: See the new instructions.      Allergies  Allergen Reactions  . Ceftriaxone Hives  . Hydrocodone Swelling    Follow-up Information    Gatha Mayer, MD Follow up in 1 week(s).   Specialty: Gastroenterology Contact information: [address] [place]  [address] [phone]        [name], MD Follow up in 2 week(s).   Specialty: Internal Medicine Contact information: [address] [place] [address] [phone]                The results of significant diagnostics from this hospitalization (including imaging, microbiology, ancillary and laboratory) are listed below for reference.    Significant Diagnostic Studies: No results found.  Microbiology: Recent Results (from the past 240 hour(s))  SARS CORONAVIRUS 2 (TAT 6-24 HRS) Nasopharyngeal Nasopharyngeal Swab     Status: None   Collection Time: 12/18/20  4:00 PM   Specimen: Nasopharyngeal Swab  Result Value Ref Range Status   SARS Coronavirus 2 NEGATIVE NEGATIVE Final    Comment: (NOTE) SARS-CoV-2 target nucleic acids are NOT DETECTED.  The SARS-CoV-2 RNA is generally detectable in upper and lower respiratory specimens during the acute phase of infection. Negative results do  not preclude SARS-CoV-2 infection, do not rule out co-infections with other pathogens, and should not be used as the sole basis for treatment or other patient management decisions. Negative results must be combined with clinical observations, patient history, and epidemiological information. The expected result is Negative.  Fact Sheet for Patients: SugarRoll.be  Fact Sheet for Healthcare Providers: https://www.woods-mathews.com/  This test is not yet approved or cleared by the [place] FDA and  has been authorized for detection and/or diagnosis of SARS-CoV-2 by FDA under an Emergency Use Authorization (EUA). This EUA will remain  in effect (meaning this test can be used) for the duration of the COVID-19 declaration under Se ction 564(b)(1) of the Act, 21 U.S.C. section 360bbb-3(b)(1), unless the authorization is terminated or revoked sooner.  Performed at [facility], [place] [address] [place],  [place] [address]      Labs: Basic Metabolic Panel: Recent Labs  Lab 12/18/20 1454 12/20/20 0559  NA 142 139  K 4.0 4.1  CL 110 108  CO2 26 25  GLUCOSE 123* 85  BUN 27* 22*  CREATININE 1.42* 1.23  CALCIUM 7.6* 7.5*   Liver Function Tests: Recent Labs  Lab 12/18/20 1454  AST 14*  ALT 14  ALKPHOS 59  BILITOT 0.3  PROT 5.2*  ALBUMIN 2.2*   No results for input(s): LIPASE, AMYLASE in the last 168 hours. No results for input(s): AMMONIA in the last 168 hours. CBC: Recent Labs  Lab 12/18/20 0845 12/18/20 1454 12/19/20 0350 12/19/20 1300 12/19/20 2254 12/20/20 0559 12/20/20 1500 12/20/20 2243 12/21/20 0603  WBC 9.5 6.9 6.0  --   --  4.6  --   --  5.4  NEUTROABS 7.1  --   --   --   --   --   --   --   --   HGB 6.2 Repeated and verified X2.* 5.2* 7.2*   < > 8.8* 8.5* 9.1* 8.6* 8.6*  HCT 20.4* 18.5* 23.7*   < > 29.3* 27.8* 29.8* 28.2* 28.8*  MCV 103.6* 112.8* 100.0  --   --  97.2  --   --  99.7  PLT 164.0 135* 102*  --   --  109*  --   --  114*   < > = values in this interval not displayed.       Signed:  Oswald Hillock MD.  [facility] 12/21/2020, 10:45 AM

## 2020-12-21 NOTE — Progress Notes (Signed)
c 

## 2020-12-22 HISTORY — PX: FRACTURE SURGERY: SHX138

## 2020-12-24 ENCOUNTER — Telehealth: Payer: Self-pay

## 2020-12-24 LAB — SURGICAL PATHOLOGY

## 2020-12-24 NOTE — Telephone Encounter (Signed)
Called the patient to schedule his hospital follow up. No answer. Left information on his voicemail. Appointment with Tye Savoy, NP on Thursday 01/24/21 at 1:30 pm. Appointment card mailed to him as well.

## 2020-12-25 ENCOUNTER — Telehealth: Payer: Self-pay

## 2020-12-25 NOTE — Telephone Encounter (Signed)
-----   Message from Keith Portela, MD sent at 12/25/2020  1:24 PM EST ----- He can resume Cabometyx.  He will keep his follow-up on February 1 as scheduled.  He needs to see his PCP regarding shortness of breath.  Thanks ----- Message ----- From: Tami Lin, RN Sent: 12/25/2020   1:23 PM EST To: Keith Portela, MD  Patient was discharged from the hospital on 12/31 for acute GI bleed. Patient said he was told to follow up with you in one week in regards to continuing Cabometyx. His next scheduled appointment is 01/22/21. Patient also requested an order for oxygen because he is short of breath. I informed the patient that he has to have an office visit and be evaluated for oxygen before an order can be written. Informed the patient that if he is severely short of breath then he needs to go to the ED. Patient stated "I'm not going back there."I asked the patient if he is short of breath with activity or at rest. He said it's mostly at night when he lays down but happens during the day sometimes. Again reiterated that if he is short of breath then he needs to go the ED. I asked the patient why this wasn't addressed at discharge and he said I wasn't short of breath then.  [name]

## 2020-12-25 NOTE — Telephone Encounter (Signed)
Called patient and let him know that per Dr. [name] he can resume Cabometyx, keep follow up as scheduled on 01/22/21, and contact PCP regarding shortness of breath. Patient verbalized understanding.

## 2020-12-26 LAB — VITAMIN B1: Vitamin B1 (Thiamine): 101.3 nmol/L (ref 66.5–200.0)

## 2020-12-28 ENCOUNTER — Other Ambulatory Visit: Payer: Self-pay

## 2020-12-28 DIAGNOSIS — D62 Acute posthemorrhagic anemia: Secondary | ICD-10-CM

## 2020-12-31 ENCOUNTER — Telehealth: Payer: Self-pay

## 2020-12-31 MED ORDER — B-12 1000 MCG PO TABS: 1000.0000 ug | ORAL_TABLET | Freq: Every day | ORAL | Status: DC

## 2020-12-31 NOTE — Telephone Encounter (Signed)
Patient notified to begin B12 1000 mcg daily .  He verbalized understanding

## 2020-12-31 NOTE — Telephone Encounter (Signed)
-----   Message from Willia Craze, NP sent at 12/30/2020 10:32 PM EST ----- Keith [name], I noticed that his B12 was very low in hospital. He has a hx of B12 deficiency and I don't think he was taking a supplement. Will you ask him if hospital recommended supplement at time of discharge ( not on discharge summary). If not then lets give him 1000 mcg daily. He should have his PCP get a follow up B12 level in ~ 3 months. Thanks

## 2021-01-01 ENCOUNTER — Other Ambulatory Visit (INDEPENDENT_AMBULATORY_CARE_PROVIDER_SITE_OTHER): Payer: Medicare Other

## 2021-01-01 DIAGNOSIS — D62 Acute posthemorrhagic anemia: Secondary | ICD-10-CM

## 2021-01-01 LAB — CBC
HCT: 27.6 % — ABNORMAL LOW (ref 39.0–52.0)
Hemoglobin: 8.9 g/dL — ABNORMAL LOW (ref 13.0–17.0)
MCHC: 32.4 g/dL (ref 30.0–36.0)
MCV: 95.1 fl (ref 78.0–100.0)
Platelets: 168 10*3/uL (ref 150.0–400.0)
RBC: 2.91 Mil/uL — ABNORMAL LOW (ref 4.22–5.81)
RDW: 22.2 % — ABNORMAL HIGH (ref 11.5–15.5)
WBC: 7.3 10*3/uL (ref 4.0–10.5)

## 2021-01-03 ENCOUNTER — Other Ambulatory Visit: Payer: Self-pay

## 2021-01-03 DIAGNOSIS — D62 Acute posthemorrhagic anemia: Secondary | ICD-10-CM

## 2021-01-11 ENCOUNTER — Other Ambulatory Visit: Payer: Self-pay | Admitting: Oncology

## 2021-01-22 ENCOUNTER — Inpatient Hospital Stay (HOSPITAL_BASED_OUTPATIENT_CLINIC_OR_DEPARTMENT_OTHER): Payer: Medicare Other | Admitting: Oncology

## 2021-01-22 ENCOUNTER — Inpatient Hospital Stay: Payer: Medicare Other | Attending: Oncology

## 2021-01-22 ENCOUNTER — Other Ambulatory Visit: Payer: Self-pay

## 2021-01-22 VITALS — BP 189/108 | HR 115 | Temp 98.1°F | Resp 20 | Ht 75.0 in | Wt 189.1 lb

## 2021-01-22 DIAGNOSIS — C649 Malignant neoplasm of unspecified kidney, except renal pelvis: Secondary | ICD-10-CM | POA: Diagnosis not present

## 2021-01-22 DIAGNOSIS — E875 Hyperkalemia: Secondary | ICD-10-CM | POA: Insufficient documentation

## 2021-01-22 DIAGNOSIS — C7951 Secondary malignant neoplasm of bone: Secondary | ICD-10-CM | POA: Insufficient documentation

## 2021-01-22 DIAGNOSIS — K5520 Angiodysplasia of colon without hemorrhage: Secondary | ICD-10-CM | POA: Diagnosis not present

## 2021-01-22 DIAGNOSIS — I2699 Other pulmonary embolism without acute cor pulmonale: Secondary | ICD-10-CM | POA: Diagnosis not present

## 2021-01-22 DIAGNOSIS — D630 Anemia in neoplastic disease: Secondary | ICD-10-CM | POA: Insufficient documentation

## 2021-01-22 DIAGNOSIS — D500 Iron deficiency anemia secondary to blood loss (chronic): Secondary | ICD-10-CM | POA: Diagnosis not present

## 2021-01-22 DIAGNOSIS — Z905 Acquired absence of kidney: Secondary | ICD-10-CM | POA: Diagnosis not present

## 2021-01-22 DIAGNOSIS — C7800 Secondary malignant neoplasm of unspecified lung: Secondary | ICD-10-CM | POA: Insufficient documentation

## 2021-01-22 DIAGNOSIS — D642 Secondary sideroblastic anemia due to drugs and toxins: Secondary | ICD-10-CM | POA: Diagnosis not present

## 2021-01-22 DIAGNOSIS — R601 Generalized edema: Secondary | ICD-10-CM | POA: Insufficient documentation

## 2021-01-22 DIAGNOSIS — Z79899 Other long term (current) drug therapy: Secondary | ICD-10-CM | POA: Insufficient documentation

## 2021-01-22 DIAGNOSIS — E871 Hypo-osmolality and hyponatremia: Secondary | ICD-10-CM | POA: Diagnosis not present

## 2021-01-22 DIAGNOSIS — K922 Gastrointestinal hemorrhage, unspecified: Secondary | ICD-10-CM | POA: Diagnosis not present

## 2021-01-22 LAB — CBC WITH DIFFERENTIAL (CANCER CENTER ONLY)
Abs Immature Granulocytes: 0.01 10*3/uL (ref 0.00–0.07)
Basophils Absolute: 0.1 10*3/uL (ref 0.0–0.1)
Basophils Relative: 1 %
Eosinophils Absolute: 0.3 10*3/uL (ref 0.0–0.5)
Eosinophils Relative: 5 %
HCT: 28.2 % — ABNORMAL LOW (ref 39.0–52.0)
Hemoglobin: 8.1 g/dL — ABNORMAL LOW (ref 13.0–17.0)
Immature Granulocytes: 0 %
Lymphocytes Relative: 17 %
Lymphs Abs: 1.1 10*3/uL (ref 0.7–4.0)
MCH: 30.7 pg (ref 26.0–34.0)
MCHC: 28.7 g/dL — ABNORMAL LOW (ref 30.0–36.0)
MCV: 106.8 fL — ABNORMAL HIGH (ref 80.0–100.0)
Monocytes Absolute: 0.5 10*3/uL (ref 0.1–1.0)
Monocytes Relative: 7 %
Neutro Abs: 4.8 10*3/uL (ref 1.7–7.7)
Neutrophils Relative %: 70 %
Platelet Count: 118 10*3/uL — ABNORMAL LOW (ref 150–400)
RBC: 2.64 MIL/uL — ABNORMAL LOW (ref 4.22–5.81)
RDW: 21.7 % — ABNORMAL HIGH (ref 11.5–15.5)
WBC Count: 6.7 10*3/uL (ref 4.0–10.5)
nRBC: 0 % (ref 0.0–0.2)

## 2021-01-22 LAB — CMP (CANCER CENTER ONLY)
ALT: 12 U/L (ref 0–44)
AST: 17 U/L (ref 15–41)
Albumin: 2.1 g/dL — ABNORMAL LOW (ref 3.5–5.0)
Alkaline Phosphatase: 72 U/L (ref 38–126)
Anion gap: 3 — ABNORMAL LOW (ref 5–15)
BUN: 22 mg/dL — ABNORMAL HIGH (ref 6–20)
CO2: 25 mmol/L (ref 22–32)
Calcium: 7.8 mg/dL — ABNORMAL LOW (ref 8.9–10.3)
Chloride: 111 mmol/L (ref 98–111)
Creatinine: 1.35 mg/dL — ABNORMAL HIGH (ref 0.61–1.24)
GFR, Estimated: >60 mL/min (ref >60)
Glucose, Bld: 108 mg/dL — ABNORMAL HIGH (ref 70–99)
Potassium: 4.6 mmol/L (ref 3.5–5.1)
Sodium: 139 mmol/L (ref 135–145)
Total Bilirubin: 0.3 mg/dL (ref 0.3–1.2)
Total Protein: 5.4 g/dL — ABNORMAL LOW (ref 6.5–8.1)

## 2021-01-22 MED ORDER — FUROSEMIDE 20 MG PO TABS: 20.0000 mg | ORAL_TABLET | Freq: Every day | ORAL | 1 refills | Status: DC

## 2021-01-22 NOTE — Progress Notes (Signed)
Hematology and Oncology Follow Up   Keith [name] [id number] [birth year] 55 y.o. 01/22/2021 8:38 AM [name], MDPharr, [name], MD       Principle Diagnosis: 55 year old man with  stage IV clear-cell renal cell carcinoma diagnosed in 2009.  He has documented pulmonary and bone involvement.     Prior Therapy: 1. Status post laparoscopic radical nephrectomy.  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  2. Patient status post thoracotomy for a synchronous metastatic lung lesions done October 2009.   3. Patient is status post stereotactic radiotherapy to pulmonary nodules in May of 2010. 4. He is S/P Sutent 50 mg 4 weeks on 2 weeks off from 10/2010 to 03/2013. He progressed at that time.  5. He is S/P radiation to the right sacral bone between 4/22 to 4/30.  6. He is S/P XRT to the left shoulder 03/20/14 to 03/31/14. 7. Votrient 800 mg by mouth daily from 03/2013 through 06/22/2015. Discontinued secondary to disease progression. 8. Nivolumab 3 mg/kg given every 2 weeks started on 06/29/2015. He is status post 4 cycles completed 08/10/2015. He developed disease progression in September 2016.  9. Status post radiation therapy to the left mid fibula completed on 11/14/2015. He received a grade 1 fraction. 10. He is status post radiation to the proximal and distal femur over 2 weeks and 10 fractions of total of 30 Gy.  This was completed in March 2019. 11.   Radiation therapy to the right tibia.  Last treatment completed in September 2019.   Current therapy: Cabometyx 60 mg daily started in November 2016.   Interim History: [name] presents today for a follow-up visit.  Since the last visit, he has been doing poorly overall and was hospitalized on December 2018 for GI bleeding.  His work-up did reveal angiodysplasia and gastritis but no active bleeding.  He received packed red cell transfusion currently placed.  He has reported overall generalized fatigue at times  poor p.o. intake in addition to overall generalized anasarca.  He has reported lower extremity edema, truncal edema as well as scrotal edema.  His mobility is reasonable but has been limited.  He denies any syncope but does report some occasional's.    Medications: Reviewed today and updated. Current Outpatient Medications  Medication Sig Dispense Refill  . acetaminophen (TYLENOL) 325 MG tablet Take 650 mg by mouth every 6 (six) hours as needed.    . ALPRAZolam (XANAX) 1 MG tablet TAKE 1 TABLET BY MOUTH THREE TIMES A DAY AS NEEDED FOR ANXIETY 90 tablet 0  . amLODipine (NORVASC) 5 MG tablet Take 1 tablet (5 mg total) by mouth daily. 30 tablet 11  . CABOMETYX 60 MG tablet TAKE 1 TABLET (60 MG) BY  MOUTH ONE TIME DAILY WITH  AT LEAST 8 OZ OF WATER ON  AN EMPTY STOMACH. DO NOT  EAT FOR 2 HOURS BEFORE OR 1 HOUR A (Patient taking differently: Take 60 mg by mouth daily.) 30 tablet 11  . calcium carbonate (TUMS - DOSED IN MG ELEMENTAL CALCIUM) 500 MG chewable tablet Chew 1 tablet by mouth 3 (three) times daily as needed for indigestion or heartburn.     . cetirizine (ZYRTEC) 10 MG tablet Take 10 mg by mouth daily as needed for allergies.    . Cyanocobalamin (B-12) 1000 MCG TABS Take 1 tablet (1,000 mcg total) by mouth daily. 30 tablet   . ferrous sulfate 325 (65 FE) MG tablet TAKE 1 TABLET BY MOUTH EVERY  DAY WITH BREAKFAST (Patient taking differently: Take 325 mg by mouth daily.) 90 tablet 2  . gabapentin (NEURONTIN) 300 MG capsule TAKE 1 CAPSULE BY MOUTH THREE TIMES A DAY (Patient taking differently: Take 300 mg by mouth 3 (three) times daily.) 270 capsule 0  . HYDROmorphone (DILAUDID) 8 MG tablet Take 1 tablet (8 mg total) by mouth every 4 (four) hours as needed for severe pain. (Patient taking differently: Take 8 mg by mouth every 4 (four) hours as needed for moderate pain or severe pain.) 30 tablet 0  . hydroxypropyl methylcellulose / hypromellose (ISOPTO TEARS / GONIOVISC) 2.5 % ophthalmic solution  Place 1 drop into both eyes daily as needed for dry eyes.    [name] ondansetron (ZOFRAN) 4 MG tablet Take one tablet every morning.  Can take one tablet later in the day on an as needed basis (Patient taking differently: Take 4 mg by mouth daily.) 50 tablet 11  . PAIN MANAGEMENT INTRATHECAL, IT, PUMP 1 each by Intrathecal route continuous. Intrathecal (IT) medication:   Morphine.    . pantoprazole (PROTONIX) 40 MG tablet Take 1 tablet (40 mg total) by mouth daily. 30 tablet 3  . XARELTO 20 MG TABS tablet TAKE 1 TABLET (20 MG TOTAL) BY MOUTH DAILY WITH SUPPER. (Patient taking differently: Take 20 mg by mouth daily.) 90 tablet 3   No current facility-administered medications for this visit.     Allergies:  Allergies  Allergen Reactions  . Ceftriaxone Hives  . Hydrocodone Swelling    Physical examination:  Blood pressure (!) 189/108, pulse (!) 115, temperature 98.1 F (36.7 C), temperature source Tympanic, resp. rate 20, height 6\' 3"  (1.905 m), weight 189 lb 1.6 oz (85.8 kg), SpO2 93 %.    ECOG 1    General appearance: Alert, awake without any distress. Head: Atraumatic without abnormalities Oropharynx: Without any thrush or ulcers. Eyes: No scleral icterus. Lymph nodes: No lymphadenopathy noted in the cervical, supraclavicular, or axillary nodes Heart:regular rate and rhythm, without any murmurs or gallops.    Bilateral lower extremity edema up to the knee Lung: Clear to auscultation without any rhonchi, wheezes or dullness to percussion. Abdomin: Soft, nontender without any shifting dullness or ascites. Scrotal edema noted. Musculoskeletal: No clubbing or cyanosis. Neurological: No motor or sensory deficits. Skin: No rashes or lesions.             Lab Results: Lab Results  Component Value Date   WBC 6.7 01/22/2021   HGB 8.1 (L) 01/22/2021   HCT 28.2 (L) 01/22/2021   MCV 106.8 (H) 01/22/2021   PLT 118 (L) 01/22/2021     Chemistry      Component Value Date/Time    NA 139 12/20/2020 0559   NA 142 12/24/2017 1317   K 4.1 12/20/2020 0559   K 4.3 12/24/2017 1317   CL 108 12/20/2020 0559   CL 106 05/24/2013 1436   CO2 25 12/20/2020 0559   CO2 32 (H) 12/24/2017 1317   BUN 22 (H) 12/20/2020 0559   BUN 10.3 12/24/2017 1317   CREATININE 1.23 12/20/2020 0559   CREATININE 1.22 11/20/2020 1120   CREATININE 0.9 12/24/2017 1317   GLU 160 (H) 06/29/2015 0957      Component Value Date/Time   CALCIUM 7.5 (L) 12/20/2020 0559   CALCIUM 8.8 12/24/2017 1317   ALKPHOS 59 12/18/2020 1454   ALKPHOS 95 12/24/2017 1317   AST 14 (L) 12/18/2020 1454   AST 18 11/20/2020 1120   AST 20 12/24/2017 1317  ALT 14 12/18/2020 1454   ALT 14 11/20/2020 1120   ALT 25 12/24/2017 1317   BILITOT 0.3 12/18/2020 1454   BILITOT 0.2 (L) 11/20/2020 1120   BILITOT 0.25 12/24/2017 1317        Impression and Plan:   55 year old man with  1.    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  He developed stage IV with lung involvement.   Last imaging studies were reviewed again today which showed overall stable disease on Cabometyx.  Risks and benefits of continuing this treatment were discussed at this time.  Given his overall poor health and generalized anasarca I recommended a treatment break at this time.  I do not believe his current medication has caused the symptoms but certainly could have contributed.  A treatment break and resumption of treatment in the future whether with this medication or alternative medication would be considered.  I will update his staging scans before the next visit.  Alternative options include immunotherapy in combination with oral targeted therapy or different oral targeted therapies such as axitinib  2.  Pulmonary embolism: He is currently on Xarelto which could be exacerbating his GI bleeding.  I will update imaging studies if no evidence of an embolism, discontinuation of Xarelto could be  considered.  3.  Anemia: Related to GI bleeding and malignancy.  Hemoglobin is adequate today.  4.    Generalized anasarca: Prescription for Lasix will be available to him.  I urged him to follow-up with his primary care physician to evaluate for other etiologies.  5.  Prognosis: Overall guarded given his advanced malignancy and recent decline.  Aggressive measures are warranted at this time..   6.  Follow-up: For reimaging future to update imaging studies and clinical status.   30  minutes were spent on this encounter.  Time was dedicated to reviewing his disease status, treatment options and future plan of care discussion.  Zola Button, MD 01/22/2021 8:38 AM

## 2021-01-24 ENCOUNTER — Ambulatory Visit: Payer: Medicare Other | Admitting: Nurse Practitioner

## 2021-01-24 ENCOUNTER — Encounter: Payer: Self-pay | Admitting: Nurse Practitioner

## 2021-01-24 VITALS — BP 150/90 | HR 117 | Ht 75.0 in | Wt 191.0 lb

## 2021-01-24 DIAGNOSIS — K922 Gastrointestinal hemorrhage, unspecified: Secondary | ICD-10-CM

## 2021-01-24 DIAGNOSIS — D649 Anemia, unspecified: Secondary | ICD-10-CM

## 2021-01-24 NOTE — Progress Notes (Signed)
ASSESSMENT AND PLAN    # 55 yo male with metastatic RCC recently hospitalized with recurrent GI bleeding and profound anemia on Xarelto ( second time since March 2021). EGD, colonoscopy and VCE unrevealing in March and no source found on recent inpatient EGD / colonoscopy. Still seeing red blood in stool.  Hgb is 8.1, down slightly since hospital discharge 12/21/20. On oral iron.  --Will arrange for video capsule study OFF iron and ON Xarelto as recommended by Dr. Carlean Purl who did his recent inpatient colonoscopy.  --For the more long term plan, might he be able to stop Xarelto?  --Patient is in good spirits as always and says he feels okay except for the generalized swelling. Not sure if he is developing heart failure or just overloaded with fluids in the hospital. Hypoalbuminemia probably a factor.  -- He does not watch sodium intake. Sees PCP on 02/12/21. In the interim I asked him to follow a 2 gram sodium diet,  weigh himself daily, Ensure BID --For the time being Oncology is holding his Cabometyx due to overall health and GI bleeding   HISTORY OF PRESENT ILLNESS     Primary Gastroenterologist : Oretha Caprice, MD      Chief Complaint : hospital follow up , blood in stool.   Keith [name] is a 55 y.o. male with PMH / [place] significant for,  but not necessarily limited to: Metastatic renal cancer status post nephrectomy and stereotactic radiotherapy to pulmonary nodule and radiation to several bony mets,  gastritis, recurrent GI bleeding, vitamin B12 deficiency, PE on Xarelto, tobacco abuse, emphysema  Brydan was hospitalized the end of December 2021 with recurrent GI bleeding on Xarelto.  He required 3 units of blood. He had a similar presentation in March 2021 but cause of bleed was never determined on endoscopic evaluation including VCE. During his December admission he underwent an repeat EGD and colonoscopy but neither identified source of bleeding. He had APC to an area of abnormal  mucosa in terminal ileum and also to a single non-bleeding angiodysplastic lesion in the colon . Jery was discharged from the hospital on 12/21/2020, his hemoglobin was 8.6.  He has been taking oral iron, repeat CBC a couple of days ago at oncology's office showed hemoglobin of 8.1.  Note his vitamin B12 in the hospital was low at 99, he is now on B12 supplements.   Interval History:  Keith [name] is here with his wife. Iron sometimes turns his stool black, other times not but he has seen red blood in stool several times since hospital discharge.  Patient complains of generalized swelling since hospital discharge.  He has orthopnea and gets short of breath with even mild exertion.  He saw Oncologist 2 days ago and Cabomety was put on hold due to overall health and anasarca.  He was started on furosemide, took two dose thus far with no significant increase in urine output.  Patient has an appointment with his PCP on the 22nd of this month.   Previous Endoscopic Evaluations / Pertinent Studies:   Dec 2021 EGD and colonoscopy -Normal esophagus - Mildly erythematous mucosa in the gastric fundus. Biopsies taken from the stomach to rule out H pylori - Normal stomach otherwise - no heme or ulceration - Normal duodenal bulb, second portion of the duodenum and third portion of the duodenum. No heme noted anywhere  ILEUM, TERMINAL, BIOPSY:  - Small bowel mucosa with mild vascular ectasia.  - No active inflammation, chronic changes  or granulomas.  - See comment   -Decreased sphincter tone found on digital rectal exam. - Altered vascular mucosa in the terminal ileum. Biopsied. Treated with argon plasma coagulation (APC). - A single non-bleeding colonic angiodysplastic lesion. Treated with argon plasma coagulation (APC). Clip (MR conditional) was placed. - The examination was otherwise normal on direct and retroflexion views. NOTE THAT HE IS ON CABOMETYX AND ADVERSE REACTION OF THIS IS HEMORRHAGE, GI  PERFORATION, ARTERIAL RUPTURE - NOT SURE OF CONTEXT OR MECHANISM BUT HE IS ON THIS MEDICATION THOUGH MUST REMEMBER IT IS LIFE-SAVING FOR HIM - I think a capsule endoscopy (repeat) might be reasonable but not now as the intestinal fluid was still dark. If I were to do a capsule would do off po iron and with a full prep and back on xarelto. this can wait til outpatient barring ongoing bleeding.   March 2021 EGD showed friable gastritis Colonoscopy suggested dark blood throughout his colon without an obvious source. Capsule endoscopy showed poor visualization but there was no overt bleeding.  Past Medical History:  Diagnosis Date  . Diabetes mellitus without complication ([place])    diet controlled only  . GI bleed   . left renal ca d'd 2008  . Malignant neoplasm metastatic to pancreas ([place]) 2012  . 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 Metastasis to bone [facility]) 2014/2016    Current Medications, Allergies, Past Surgical History, Family History and Social History were reviewed in [employer] record.   Current Outpatient Medications  Medication Sig Dispense Refill  . acetaminophen (TYLENOL) 325 MG tablet Take 650 mg by mouth every 6 (six) hours as needed.    . ALPRAZolam (XANAX) 1 MG tablet TAKE 1 TABLET BY MOUTH THREE TIMES A DAY AS NEEDED FOR ANXIETY 90 tablet 0  . amLODipine (NORVASC) 5 MG tablet Take 1 tablet (5 mg total) by mouth daily. 30 tablet 11  . calcium carbonate (TUMS - DOSED IN MG ELEMENTAL CALCIUM) 500 MG chewable tablet Chew 1 tablet by mouth 3 (three) times daily as needed for indigestion or heartburn.     . cetirizine (ZYRTEC) 10 MG tablet Take 10 mg by mouth daily as needed for allergies.    . Cyanocobalamin (B-12) 1000 MCG TABS Take 1 tablet (1,000 mcg total) by mouth daily. 30 tablet   . ferrous sulfate 325 (65 FE) MG tablet TAKE 1 TABLET BY MOUTH EVERY DAY WITH BREAKFAST (Patient taking differently: Take 325 mg by mouth daily.) 90 tablet 2  . furosemide (LASIX) 20  MG tablet Take 1 tablet (20 mg total) by mouth daily. 30 tablet 1  . gabapentin (NEURONTIN) 300 MG capsule TAKE 1 CAPSULE BY MOUTH THREE TIMES A DAY (Patient taking differently: Take 300 mg by mouth 3 (three) times daily.) 270 capsule 0  . HYDROmorphone (DILAUDID) 8 MG tablet Take 1 tablet (8 mg total) by mouth every 4 (four) hours as needed for severe pain. (Patient taking differently: Take 8 mg by mouth every 4 (four) hours as needed for moderate pain or severe pain.) 30 tablet 0  . hydroxypropyl methylcellulose / hypromellose (ISOPTO TEARS / GONIOVISC) 2.5 % ophthalmic solution Place 1 drop into both eyes daily as needed for dry eyes.    [name] ondansetron (ZOFRAN) 4 MG tablet Take one tablet every morning.  Can take one tablet later in the day on an as needed basis (Patient taking differently: Take 4 mg by mouth daily.) 50 tablet 11  . PAIN MANAGEMENT INTRATHECAL, IT, PUMP 1 each  by Intrathecal route continuous. Intrathecal (IT) medication:   Morphine.    . pantoprazole (PROTONIX) 40 MG tablet Take 1 tablet (40 mg total) by mouth daily. 30 tablet 3  . XARELTO 20 MG TABS tablet TAKE 1 TABLET (20 MG TOTAL) BY MOUTH DAILY WITH SUPPER. (Patient taking differently: Take 20 mg by mouth daily.) 90 tablet 3   No current facility-administered medications for this visit.    Review of Systems: Positive for fatigue and SOB. No chest pain.  No urinary complaints.   PHYSICAL EXAM :    Wt Readings from Last 3 Encounters:  01/24/21 191 lb (86.6 kg)  01/22/21 189 lb 1.6 oz (85.8 kg)  12/19/20 182 lb 15.7 oz (83 kg)    BP (!) 150/90   Pulse (!) 117   Ht 6' 3" (1.905 m)   Wt 191 lb (86.6 kg)   BMI 23.87 kg/m  Constitutional:  Pale, pleasant,  chronically ill appearing male in no acute distress. Psychiatric: Normal mood and affect. Behavior is normal. EENT: Pupils normal.  Conjunctivae are normal. No scleral icterus. Neck supple.  Cardiovascular: Normal rate, regular rhythm. BLE pitting  edema Pulmonary/chest: Effort normal . Occasional bilateral wheeze.  Abdominal: Limited exam, patient examined in chair. Abdomen protuberant, nontender. Bowel sounds active throughout.  Pain pump in LLQ Neurological: Alert and oriented to person place and time. Skin:  Pale  Tye Savoy, NP  01/24/2021, 1:33 PM

## 2021-01-24 NOTE — Patient Instructions (Addendum)
CAPSULE ENDOSCOPY PATIENT INSTRUCTION Keith [name] Apr 14, 1966 [id number]   1. 01/25/2021 Seven (7) days prior to capsule endoscopy stop taking iron supplements and carafate.  2. 01/30/2021 Two (2) days prior to capsule endoscopy stop taking aspirin or any arthritis drugs.  3. 01/31/2021 Day before capsule endoscopy purchase a 238 gram bottle of Miralax from the laxative section of your drug store, and a 32 oz. bottle of Gatorade (no red).    4. 01/31/2021 One (1) day prior to capsule endoscopy: a) Stop smoking. b) Eat a regular diet until 12:00 Noon. c) After 12:00 Noon take only the following: Black coffee  Jell-O (no fruit or red Jell-o) Water   Bouillon (chicken or beef) 7-Up   Cranberry Juice Tea   Kool-Aid Popsicle (not red) Sprite   Coke Ginger Ale  Pepsi Mountain Dew Gatorade d) At 6:00 pm the evening before your appointment, drink 7 capfuls (105 grams) of Miralax with 32 oz. Gatorade. Drink 8 oz every 15 minutes until gone. e) Nothing to eat or drink after midnight except medications with a sip of water.  5. 02/01/2021 Day of capsule endoscopy:  No medications for 2 hours prior to your test.  6. Please arrive at [facility]  3rd floor patient registration area by 8:40am on: 02/01/2021.   For any questions: Call [facility] at [phone] and ask to speak with one of the capsule endoscopy nurses.  YOU WILL NEED TO RETURN THE EQUIPMENT AT 4 PM ON THE DAY OF THE PROCEDURE.  PLEASE KEEP THIS IN MIND WHEN SCHEDULING.    Small Bowel Capsule Endoscopy  What you should know: Small Bowel capsule endoscopy is a procedure that takes pictures of the inside of your small intestine (bowel).  Your small bowel connects to your stomach on one end, and your large bowel (colon) on the other.  A capsule endoscopy is done by swallowing a pill size camera.  The capsule moves through your stomach and into your small bowel, where pictures are taken.   You may need a small  bowel capsule endoscopy if you have symptoms, such as blood in your stool, chronic stomach pain, and diarrhea.  The pictures may show if you have growths, swelling, and bleeding area in you small bowel.  A capsule endoscopy may also show if diseases such as Crohn's or celiac disease are causing your symptoms.  Having a small bowel capsule endoscopy may help you and your caregiver learn the cause of your symptoms.  Learning what is causing your symptoms allows you to receive needed treatment and prevent further problems. Risks: . You may have stomach pain during your procedure.   . The pictures taken by the capsule may not be clear.   . The pictures may not show the cause of your symptoms.   . You may need another endoscopy procedure.  .  The capsule may get trapped in your esophagus or intestines. You may need surgery or additional procedures to remove the capsule from your body.    Before your procedure: You will be instructed to stop certain prescription medications or over- the -counter medications prior to the procedure.   The day before your scheduled appointment you will need to be on a restricted diet and will need to drink a bowel prep that will clean out your bowels.   The day of the procedure: . You may drive yourself to the procedure.   . You will need to plan on 2 trips to the office  on the day of the procedure. Morning: . Plan to be at the office about 45 minutes. . The morning of the procedure a sensor belt and recorder will be placed on you.  You will wear this for 8 hours.  (The sensor belt transfers pictures of your small bowel to the recorder.)   You will be given a pill-sized capsule endoscope to swallow.  Once you swallow the capsule it will travel through your body the same way food does, constantly taking pictures along the way.  The capsule takes 2-3 pictures a second.   . Once you have left the office you may go about your normal day with a few exceptions: You may not go  near a MRI machine or a radio or television towers; You need to avoid other patients having capsule endoscopy; You will be given a written diet to follow for the day.  Afternoon: . You will need to be return to the office at your designated time. . The sensors belt will be removed . You will need to be at the office about 15 minutes.   Remain on [place].  HOLD IRON FOR 7 DAYS PRIOR to CAPSULE STUDY.   Follow 2 gram Sodium Diet. ( see handout).   Drink Ensure twice daily.   Monitor your Weight everyday.   Thank you for choosing me and [facility].  Tye Savoy NP

## 2021-01-25 NOTE — Progress Notes (Signed)
Agree with the above note, plan

## 2021-01-29 ENCOUNTER — Telehealth: Payer: Self-pay | Admitting: Oncology

## 2021-01-29 DIAGNOSIS — G894 Chronic pain syndrome: Secondary | ICD-10-CM | POA: Diagnosis not present

## 2021-01-29 NOTE — Telephone Encounter (Signed)
Scheduled per 02/01 los, patient has been called and notified.  

## 2021-01-31 ENCOUNTER — Emergency Department (HOSPITAL_COMMUNITY): Payer: Medicare Other

## 2021-01-31 ENCOUNTER — Encounter (HOSPITAL_COMMUNITY): Payer: Self-pay

## 2021-01-31 ENCOUNTER — Other Ambulatory Visit: Payer: Self-pay

## 2021-01-31 ENCOUNTER — Inpatient Hospital Stay (HOSPITAL_COMMUNITY): Payer: Medicare Other

## 2021-01-31 ENCOUNTER — Inpatient Hospital Stay (HOSPITAL_COMMUNITY)
Admission: EM | Admit: 2021-01-31 | Discharge: 2021-02-06 | DRG: 291 | Disposition: A | Discharge status: Home / Self Care | Payer: Medicare Other | Attending: Internal Medicine | Admitting: Internal Medicine

## 2021-01-31 DIAGNOSIS — R0600 Dyspnea, unspecified: Secondary | ICD-10-CM | POA: Diagnosis present

## 2021-01-31 DIAGNOSIS — R601 Generalized edema: Secondary | ICD-10-CM | POA: Diagnosis not present

## 2021-01-31 DIAGNOSIS — R069 Unspecified abnormalities of breathing: Secondary | ICD-10-CM | POA: Diagnosis not present

## 2021-01-31 DIAGNOSIS — R091 Pleurisy: Secondary | ICD-10-CM | POA: Diagnosis not present

## 2021-01-31 DIAGNOSIS — D62 Acute posthemorrhagic anemia: Secondary | ICD-10-CM | POA: Diagnosis present

## 2021-01-31 DIAGNOSIS — J984 Other disorders of lung: Secondary | ICD-10-CM | POA: Diagnosis not present

## 2021-01-31 DIAGNOSIS — K922 Gastrointestinal hemorrhage, unspecified: Secondary | ICD-10-CM | POA: Diagnosis not present

## 2021-01-31 DIAGNOSIS — I5043 Acute on chronic combined systolic (congestive) and diastolic (congestive) heart failure: Secondary | ICD-10-CM | POA: Diagnosis present

## 2021-01-31 DIAGNOSIS — J9811 Atelectasis: Secondary | ICD-10-CM | POA: Diagnosis not present

## 2021-01-31 DIAGNOSIS — Z923 Personal history of irradiation: Secondary | ICD-10-CM | POA: Diagnosis not present

## 2021-01-31 DIAGNOSIS — D649 Anemia, unspecified: Secondary | ICD-10-CM | POA: Diagnosis present

## 2021-01-31 DIAGNOSIS — E538 Deficiency of other specified B group vitamins: Secondary | ICD-10-CM | POA: Diagnosis present

## 2021-01-31 DIAGNOSIS — C7802 Secondary malignant neoplasm of left lung: Secondary | ICD-10-CM | POA: Diagnosis not present

## 2021-01-31 DIAGNOSIS — C7951 Secondary malignant neoplasm of bone: Secondary | ICD-10-CM | POA: Diagnosis present

## 2021-01-31 DIAGNOSIS — J9601 Acute respiratory failure with hypoxia: Secondary | ICD-10-CM | POA: Diagnosis not present

## 2021-01-31 DIAGNOSIS — C7801 Secondary malignant neoplasm of right lung: Secondary | ICD-10-CM | POA: Diagnosis not present

## 2021-01-31 DIAGNOSIS — I130 Hypertensive heart and chronic kidney disease with heart failure and stage 1 through stage 4 chronic kidney disease, or unspecified chronic kidney disease: Principal | ICD-10-CM | POA: Diagnosis present

## 2021-01-31 DIAGNOSIS — F17210 Nicotine dependence, cigarettes, uncomplicated: Secondary | ICD-10-CM | POA: Diagnosis present

## 2021-01-31 DIAGNOSIS — Z86711 Personal history of pulmonary embolism: Secondary | ICD-10-CM

## 2021-01-31 DIAGNOSIS — R0602 Shortness of breath: Secondary | ICD-10-CM

## 2021-01-31 DIAGNOSIS — J918 Pleural effusion in other conditions classified elsewhere: Secondary | ICD-10-CM | POA: Diagnosis present

## 2021-01-31 DIAGNOSIS — R64 Cachexia: Secondary | ICD-10-CM | POA: Diagnosis not present

## 2021-01-31 DIAGNOSIS — D500 Iron deficiency anemia secondary to blood loss (chronic): Secondary | ICD-10-CM | POA: Diagnosis not present

## 2021-01-31 DIAGNOSIS — I10 Essential (primary) hypertension: Secondary | ICD-10-CM | POA: Diagnosis present

## 2021-01-31 DIAGNOSIS — N179 Acute kidney failure, unspecified: Secondary | ICD-10-CM

## 2021-01-31 DIAGNOSIS — R0689 Other abnormalities of breathing: Secondary | ICD-10-CM | POA: Diagnosis not present

## 2021-01-31 DIAGNOSIS — G893 Neoplasm related pain (acute) (chronic): Secondary | ICD-10-CM | POA: Diagnosis not present

## 2021-01-31 DIAGNOSIS — C642 Malignant neoplasm of left kidney, except renal pelvis: Secondary | ICD-10-CM | POA: Diagnosis not present

## 2021-01-31 DIAGNOSIS — Z79899 Other long term (current) drug therapy: Secondary | ICD-10-CM

## 2021-01-31 DIAGNOSIS — E038 Other specified hypothyroidism: Secondary | ICD-10-CM | POA: Diagnosis present

## 2021-01-31 DIAGNOSIS — I471 Supraventricular tachycardia: Secondary | ICD-10-CM | POA: Diagnosis not present

## 2021-01-31 DIAGNOSIS — C7800 Secondary malignant neoplasm of unspecified lung: Secondary | ICD-10-CM | POA: Diagnosis present

## 2021-01-31 DIAGNOSIS — I313 Pericardial effusion (noninflammatory): Secondary | ICD-10-CM | POA: Diagnosis not present

## 2021-01-31 DIAGNOSIS — T45515A Adverse effect of anticoagulants, initial encounter: Secondary | ICD-10-CM | POA: Diagnosis present

## 2021-01-31 DIAGNOSIS — R6889 Other general symptoms and signs: Secondary | ICD-10-CM | POA: Diagnosis not present

## 2021-01-31 DIAGNOSIS — C7889 Secondary malignant neoplasm of other digestive organs: Secondary | ICD-10-CM | POA: Diagnosis present

## 2021-01-31 DIAGNOSIS — Y92009 Unspecified place in unspecified non-institutional (private) residence as the place of occurrence of the external cause: Secondary | ICD-10-CM | POA: Diagnosis not present

## 2021-01-31 DIAGNOSIS — E43 Unspecified severe protein-calorie malnutrition: Secondary | ICD-10-CM | POA: Diagnosis not present

## 2021-01-31 DIAGNOSIS — Z833 Family history of diabetes mellitus: Secondary | ICD-10-CM

## 2021-01-31 DIAGNOSIS — N1831 Chronic kidney disease, stage 3a: Secondary | ICD-10-CM | POA: Diagnosis not present

## 2021-01-31 DIAGNOSIS — J439 Emphysema, unspecified: Secondary | ICD-10-CM | POA: Diagnosis present

## 2021-01-31 DIAGNOSIS — I2699 Other pulmonary embolism without acute cor pulmonale: Secondary | ICD-10-CM

## 2021-01-31 DIAGNOSIS — E1122 Type 2 diabetes mellitus with diabetic chronic kidney disease: Secondary | ICD-10-CM | POA: Diagnosis not present

## 2021-01-31 DIAGNOSIS — R5381 Other malaise: Secondary | ICD-10-CM | POA: Diagnosis not present

## 2021-01-31 DIAGNOSIS — J90 Pleural effusion, not elsewhere classified: Secondary | ICD-10-CM

## 2021-01-31 DIAGNOSIS — I248 Other forms of acute ischemic heart disease: Secondary | ICD-10-CM | POA: Diagnosis not present

## 2021-01-31 DIAGNOSIS — I5021 Acute systolic (congestive) heart failure: Secondary | ICD-10-CM | POA: Diagnosis not present

## 2021-01-31 DIAGNOSIS — C799 Secondary malignant neoplasm of unspecified site: Secondary | ICD-10-CM | POA: Diagnosis not present

## 2021-01-31 DIAGNOSIS — C649 Malignant neoplasm of unspecified kidney, except renal pelvis: Secondary | ICD-10-CM

## 2021-01-31 DIAGNOSIS — K8689 Other specified diseases of pancreas: Secondary | ICD-10-CM | POA: Diagnosis not present

## 2021-01-31 DIAGNOSIS — Z885 Allergy status to narcotic agent status: Secondary | ICD-10-CM | POA: Diagnosis not present

## 2021-01-31 DIAGNOSIS — Z85528 Personal history of other malignant neoplasm of kidney: Secondary | ICD-10-CM

## 2021-01-31 DIAGNOSIS — Z20822 Contact with and (suspected) exposure to covid-19: Secondary | ICD-10-CM | POA: Diagnosis not present

## 2021-01-31 DIAGNOSIS — Z743 Need for continuous supervision: Secondary | ICD-10-CM | POA: Diagnosis not present

## 2021-01-31 DIAGNOSIS — R7989 Other specified abnormal findings of blood chemistry: Secondary | ICD-10-CM | POA: Diagnosis present

## 2021-01-31 DIAGNOSIS — Z905 Acquired absence of kidney: Secondary | ICD-10-CM

## 2021-01-31 DIAGNOSIS — M47814 Spondylosis without myelopathy or radiculopathy, thoracic region: Secondary | ICD-10-CM | POA: Diagnosis not present

## 2021-01-31 DIAGNOSIS — E46 Unspecified protein-calorie malnutrition: Secondary | ICD-10-CM | POA: Insufficient documentation

## 2021-01-31 DIAGNOSIS — Z9889 Other specified postprocedural states: Secondary | ICD-10-CM

## 2021-01-31 DIAGNOSIS — Z809 Family history of malignant neoplasm, unspecified: Secondary | ICD-10-CM

## 2021-01-31 DIAGNOSIS — F419 Anxiety disorder, unspecified: Secondary | ICD-10-CM | POA: Diagnosis present

## 2021-01-31 DIAGNOSIS — Z6820 Body mass index (BMI) 20.0-20.9, adult: Secondary | ICD-10-CM

## 2021-01-31 DIAGNOSIS — E8809 Other disorders of plasma-protein metabolism, not elsewhere classified: Secondary | ICD-10-CM | POA: Diagnosis present

## 2021-01-31 DIAGNOSIS — Z8249 Family history of ischemic heart disease and other diseases of the circulatory system: Secondary | ICD-10-CM

## 2021-01-31 DIAGNOSIS — Z902 Acquired absence of lung [part of]: Secondary | ICD-10-CM

## 2021-01-31 DIAGNOSIS — I429 Cardiomyopathy, unspecified: Secondary | ICD-10-CM | POA: Diagnosis present

## 2021-01-31 LAB — CBC WITH DIFFERENTIAL/PLATELET
Abs Immature Granulocytes: 0.02 10*3/uL (ref 0.00–0.07)
Basophils Absolute: 0.1 10*3/uL (ref 0.0–0.1)
Basophils Relative: 1 %
Eosinophils Absolute: 0.1 10*3/uL (ref 0.0–0.5)
Eosinophils Relative: 1 %
HCT: 21.6 % — ABNORMAL LOW (ref 39.0–52.0)
Hemoglobin: 6.1 g/dL — CL (ref 13.0–17.0)
Immature Granulocytes: 0 %
Lymphocytes Relative: 8 %
Lymphs Abs: 0.6 10*3/uL — ABNORMAL LOW (ref 0.7–4.0)
MCH: 32.1 pg (ref 26.0–34.0)
MCHC: 28.2 g/dL — ABNORMAL LOW (ref 30.0–36.0)
MCV: 113.7 fL — ABNORMAL HIGH (ref 80.0–100.0)
Monocytes Absolute: 0.7 10*3/uL (ref 0.1–1.0)
Monocytes Relative: 10 %
Neutro Abs: 5.4 10*3/uL (ref 1.7–7.7)
Neutrophils Relative %: 80 %
Platelets: 178 10*3/uL (ref 150–400)
RBC: 1.9 MIL/uL — ABNORMAL LOW (ref 4.22–5.81)
RDW: 22.4 % — ABNORMAL HIGH (ref 11.5–15.5)
WBC: 6.8 10*3/uL (ref 4.0–10.5)
nRBC: 0.3 % — ABNORMAL HIGH (ref 0.0–0.2)

## 2021-01-31 LAB — URINALYSIS, ROUTINE W REFLEX MICROSCOPIC
Bilirubin Urine: NEGATIVE
Glucose, UA: NEGATIVE mg/dL
Ketones, ur: NEGATIVE mg/dL
Leukocytes,Ua: NEGATIVE
Nitrite: NEGATIVE
Protein, ur: 100 mg/dL — AB
Specific Gravity, Urine: 1.008 (ref 1.005–1.030)
pH: 5 (ref 5.0–8.0)

## 2021-01-31 LAB — COMPREHENSIVE METABOLIC PANEL
ALT: 13 U/L (ref 0–44)
AST: 14 U/L — ABNORMAL LOW (ref 15–41)
Albumin: 2.2 g/dL — ABNORMAL LOW (ref 3.5–5.0)
Alkaline Phosphatase: 63 U/L (ref 38–126)
Anion gap: 9 (ref 5–15)
BUN: 34 mg/dL — ABNORMAL HIGH (ref 6–20)
CO2: 26 mmol/L (ref 22–32)
Calcium: 8.2 mg/dL — ABNORMAL LOW (ref 8.9–10.3)
Chloride: 104 mmol/L (ref 98–111)
Creatinine, Ser: 1.8 mg/dL — ABNORMAL HIGH (ref 0.61–1.24)
GFR, Estimated: 44 mL/min — ABNORMAL LOW (ref >60)
Glucose, Bld: 125 mg/dL — ABNORMAL HIGH (ref 70–99)
Potassium: 5 mmol/L (ref 3.5–5.1)
Sodium: 139 mmol/L (ref 135–145)
Total Bilirubin: 0.6 mg/dL (ref 0.3–1.2)
Total Protein: 5.5 g/dL — ABNORMAL LOW (ref 6.5–8.1)

## 2021-01-31 LAB — TROPONIN I (HIGH SENSITIVITY)
Troponin I (High Sensitivity): 55 ng/L — ABNORMAL HIGH (ref <18)
Troponin I (High Sensitivity): 29 ng/L — ABNORMAL HIGH (ref <18)

## 2021-01-31 LAB — BRAIN NATRIURETIC PEPTIDE: B Natriuretic Peptide: 341.5 pg/mL — ABNORMAL HIGH (ref 0.0–100.0)

## 2021-01-31 LAB — RESP PANEL BY RT-PCR (FLU A&B, COVID) ARPGX2
Influenza A by PCR: NEGATIVE
Influenza B by PCR: NEGATIVE
SARS Coronavirus 2 by RT PCR: NEGATIVE

## 2021-01-31 LAB — PREPARE RBC (CROSSMATCH)

## 2021-01-31 LAB — HEMOGLOBIN AND HEMATOCRIT, BLOOD
HCT: 27.1 % — ABNORMAL LOW (ref 39.0–52.0)
Hemoglobin: 7.4 g/dL — ABNORMAL LOW (ref 13.0–17.0)

## 2021-01-31 LAB — D-DIMER, QUANTITATIVE: D-Dimer, Quant: 2.8 ug/mL-FEU — ABNORMAL HIGH (ref 0.00–0.50)

## 2021-01-31 MED ORDER — ONDANSETRON HCL 4 MG/2ML IJ SOLN: 4.0000 mg | Freq: Four times a day (QID) | INTRAMUSCULAR | Status: DC | PRN

## 2021-01-31 MED ORDER — PAIN MANAGEMENT INTRATHECAL (IT) PUMP: 1.0000 | Status: DC

## 2021-01-31 MED ORDER — ACETAMINOPHEN 325 MG PO TABS: 650.0000 mg | ORAL_TABLET | Freq: Four times a day (QID) | ORAL | Status: DC | PRN

## 2021-01-31 MED ORDER — ONDANSETRON HCL 4 MG PO TABS: 4.0000 mg | ORAL_TABLET | Freq: Four times a day (QID) | ORAL | Status: DC | PRN

## 2021-01-31 MED ORDER — GABAPENTIN 300 MG PO CAPS: 300.0000 mg | ORAL_CAPSULE | Freq: Three times a day (TID) | ORAL | Status: DC

## 2021-01-31 MED ORDER — NONFORMULARY OR COMPOUNDED ITEM: 1.0000 | Status: DC

## 2021-01-31 MED ORDER — IOHEXOL 300 MG/ML  SOLN
100.0000 mL | Freq: Once | INTRAMUSCULAR | Status: AC | PRN
  Administered 2021-01-31: 75 mL via INTRAVENOUS

## 2021-01-31 MED ORDER — SODIUM CHLORIDE 0.9% FLUSH
3.0000 mL | Freq: Two times a day (BID) | INTRAVENOUS | Status: DC
  Administered 2021-01-31 – 2021-02-06 (×10): 3 mL via INTRAVENOUS

## 2021-01-31 MED ORDER — FUROSEMIDE 10 MG/ML IJ SOLN
40.0000 mg | Freq: Two times a day (BID) | INTRAMUSCULAR | Status: DC
  Administered 2021-01-31 – 2021-02-02 (×5): 40 mg via INTRAVENOUS
  Filled 2021-01-31 (×5): qty 4

## 2021-01-31 MED ORDER — ALPRAZOLAM 0.5 MG PO TABS
1.0000 mg | ORAL_TABLET | Freq: Three times a day (TID) | ORAL | Status: DC | PRN
  Administered 2021-01-31 – 2021-02-04 (×3): 1 mg via ORAL
  Filled 2021-01-31 (×3): qty 2

## 2021-01-31 MED ORDER — FUROSEMIDE 10 MG/ML IJ SOLN
20.0000 mg | Freq: Once | INTRAMUSCULAR | Status: AC
  Administered 2021-01-31: 20 mg via INTRAVENOUS
  Filled 2021-01-31: qty 4

## 2021-01-31 MED ORDER — METOPROLOL TARTRATE 5 MG/5ML IV SOLN: 5.0000 mg | Freq: Four times a day (QID) | INTRAVENOUS | Status: DC | PRN

## 2021-01-31 MED ORDER — SODIUM CHLORIDE 0.9% IV SOLUTION: Freq: Once | INTRAVENOUS | Status: AC

## 2021-01-31 MED ORDER — PANTOPRAZOLE SODIUM 40 MG PO TBEC
40.0000 mg | DELAYED_RELEASE_TABLET | Freq: Every day | ORAL | Status: DC
  Administered 2021-01-31 – 2021-02-06 (×7): 40 mg via ORAL
  Filled 2021-01-31 (×7): qty 1

## 2021-01-31 MED ORDER — HYDROMORPHONE HCL 2 MG PO TABS
8.0000 mg | ORAL_TABLET | ORAL | Status: DC | PRN
  Administered 2021-01-31 – 2021-02-04 (×6): 8 mg via ORAL
  Filled 2021-01-31 (×6): qty 4

## 2021-01-31 MED ORDER — FUROSEMIDE 10 MG/ML IJ SOLN
40.0000 mg | Freq: Once | INTRAMUSCULAR | Status: AC
  Administered 2021-01-31: 40 mg via INTRAVENOUS
  Filled 2021-01-31: qty 4

## 2021-01-31 NOTE — Progress Notes (Signed)
Events noted.  Keith [name] known to me with history of advanced renal cell carcinoma has been on multiple therapies including his current treatment with Cabometyx for an extended period of time.  He has developed a symptoms of generalized anasarca, shortness of breath and worsening anemia and questionable GI bleed.  Chest x-ray the emergency department showed potential disease progression.  Laboratory data and imaging studies have been personally reviewed at this time and showed a hemoglobin of 6.1 and his chest x-ray showed possible progression of the right suprahilar and right infrahilar pulmonary lesions.   Based on these findings, I recommended withholding any anticancer treatment at this time including Cabometyx which she is already on.  I recommended updating his staging scans with a CT scan chest abdomen and pelvis which was planned to take place as an outpatient.  Once staging scans are completed, we will reevaluate regarding future cancer treatments.  In the meantime, I agree with supportive managements including aggressive diuresis and transfusion of packed red cells.   We will continue to follow his progress.

## 2021-01-31 NOTE — Consult Note (Signed)
NAME:  Keith [name], MRN:  [id number], DOB:  1966/02/14, LOS: 0 ADMISSION DATE:  01/31/2021, CONSULTATION DATE:  01/31/2021 REFERRING MD:  Dr. Neysa Bonito , CHIEF COMPLAINT:  Pleural effusion    Brief History:  55yo male presented with SOB in the setting of metastatic renal cell carcinoma. Imaging with moderate moderate to large left pleural effusion prompting pulmonary consult  History of Present Illness:  Keith [name] is a 55 y.o. 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 Patient reports dyspnea acutely worsened day of admission and was the main driver behind coming to the ED. He report the dyspnea is worsened with exertion and improved with rest . Also report dry non-productive cough and orthopnea.   On arrival to ED he was seen with mild tachypnea, mild tachycardia, and hypertensive. Labwork revealed multiple abnormalities including; BUN 34, creatinine 1.80, albumin 2.2, BNP 341.5, HS troponin 55, hgb 6.1, and hct 21.6. CT scan of the chest/ABD/Pelvis was obtained and revealed; increased pulmonary masses bilaterally, increased size of left pleural effusion, diffuse third spacing of fluid.   Given the increased size of pleural effusion with subjective dyspnea PCCM was consulted for possible need of thoracentesis.   Past Medical History:  Metastatic renal cell carcinoma with mets to the lung and bone, S/P nephrectomy, tobacco abuse, PE on Xarelto, recurrent GI bleeds, and anemia   Significant Hospital Events:  Admitted 2/10  Consults:  Gi Oncology   Procedures:    Significant Diagnostic Tests:  CT Chest/ ABD/ Pelvis  1. Mild enlargement of the left lower lobe perihilar mass, and of the right middle lobe/right upper lobe peribronchovascular  mass. Increased left pleural effusion, now moderate to large. Stable small right pleural effusion. Stable small pericardial effusion. 2. Diffuse subcutaneous, mesenteric, and mediastinal edema compatible with third spacing of fluid. There is some hazy density in the lungs which also may be due to noncardiogenic edema. 3. Stable appearance of multiple pancreatic masses. 4. Stable osseous manifestations of metastatic disease, with the largest lesion in the right sacrum. 5. Emphysema and aortic atherosclerosis.  Micro Data:  COVID 2/10 > Negative   Antimicrobials:    Interim History / Subjective:  In ED stretcher with wife at bedside in no acute distress. States dyspnea is improved  Objective   Blood pressure (!) 151/100, pulse 94, temperature 97.6 F (36.4 C), temperature source Oral, resp. rate 18, SpO2 99 %.        Intake/Output Summary (Last 24 hours) at 01/31/2021 1144 Last data filed at 01/31/2021 1015 Gross per 24 hour  Intake 815 ml  Output -  Net 815 ml   There were no vitals filed for this visit.  Examination: General: Chronic ill-appearing cachectic middle-aged male lying in bed in no acute distress HEENT: [place]/AT, MM pink/moist, PERRL,  Neuro: Alert and oriented x3, nonfocal CV: s1s2 regular rate and rhythm, no murmur, rubs, or gallops,  PULM: Diminished in bilateral lower bases, left greater than right, slight expiratory wheeze, oxygen saturations appropriate on room air, no increased work of breathing GI: soft, bowel sounds active in all 4 quadrants, non-tender, non-distended Extremities: warm/dry, generalized 3+ pitting edema from lower abdomen to lower extremities Skin: no rashes or lesions  Resolved Hospital Problem list     Assessment &  Plan:  Moderate to large left pleural effusion, multifactorial  -Secondary to metastatic renal cell carcinoma with mets to the lung, pancrease, and bone  -Patient has also developed severe anasarca with significant volume  overload  P:  Anticoagulation on hold, discussed with GI plans to preform capsule study at a later date given acute illness  Unable to perform thoracentesis in the setting of recent anticoagulation Diurese as able Encourage pulmonary hygiene  Remains on RA PCCM will continue to follow for possible need of thoracentesis   Best practice (evaluated daily)  Diet: Low salt diet  Pain/Anxiety/Delirium protocol (if indicated): As needed  VAP protocol (if indicated): N/A DVT prophylaxis: Xarelto on hold  GI prophylaxis: PPI Glucose control: SSI Mobility: Up as tolerated  Disposition:per primary   Labs   CBC: Recent Labs  Lab 01/31/21 0635  WBC 6.8  NEUTROABS 5.4  HGB 6.1*  HCT 21.6*  MCV 113.7*  PLT 354    Basic Metabolic Panel: Recent Labs  Lab 01/31/21 0606  NA 139  K 5.0  CL 104  CO2 26  GLUCOSE 125*  BUN 34*  CREATININE 1.80*  CALCIUM 8.2*   GFR: Estimated Creatinine Clearance: 56.7 mL/min (A) (by C-G formula based on SCr of 1.8 mg/dL (H)). Recent Labs  Lab 01/31/21 0635  WBC 6.8    Liver Function Tests: Recent Labs  Lab 01/31/21 0606  AST 14*  ALT 13  ALKPHOS 63  BILITOT 0.6  PROT 5.5*  ALBUMIN 2.2*   No results for input(s): LIPASE, AMYLASE in the last 168 hours. No results for input(s): AMMONIA in the last 168 hours.  ABG    Component Value Date/Time   PHART 7.393 09/27/2008 0400   PCO2ART 42.5 09/27/2008 0400   PO2ART 53.1 (L) 09/27/2008 0400   HCO3 25.3 (H) 09/27/2008 0400   TCO2 26.6 09/27/2008 0400   ACIDBASEDEF 1.9 09/25/2008 1003   O2SAT 87.2 09/27/2008 0400     Coagulation Profile: No results for input(s): INR, PROTIME in the last 168 hours.  Cardiac Enzymes: No results for input(s): CKTOTAL, CKMB, CKMBINDEX, TROPONINI in the last 168 hours.  HbA1C: Hgb A1c MFr Bld  Date/Time Value Ref Range Status  02/29/2020 07:34 AM 4.9 4.8 - 5.6 % Final    Comment:    (NOTE) Pre diabetes:          5.7%-6.4% Diabetes:               >6.4% Glycemic control for   <7.0% adults with diabetes   08/29/2019 02:36 AM <4.2 (L) 4.8 - 5.6 % Final    Comment:    (NOTE) **Verified by repeat analysis**         Prediabetes: 5.7 - 6.4         Diabetes: >6.4         Glycemic control for adults with diabetes: <7.0     CBG: No results for input(s): GLUCAP in the last 168 hours.  Review of Systems: Positives in bold  Gen: Denies fever, chills, weight change, fatigue, night sweats HEENT: Denies blurred vision, double vision, hearing loss, tinnitus, sinus congestion, rhinorrhea, sore throat, neck stiffness, dysphagia PULM: Denies shortness of breath, cough, sputum production, hemoptysis, wheezing CV: Denies chest pain, edema, orthopnea, paroxysmal nocturnal dyspnea, palpitations GI: Denies abdominal pain, nausea, vomiting, diarrhea, hematochezia, melena, constipation, change in bowel habits GU: Denies dysuria, hematuria, polyuria, oliguria, urethral discharge Endocrine: Denies hot or cold intolerance, polyuria, polyphagia or appetite change Derm: Denies rash, dry skin, scaling or  peeling skin change Heme: Denies easy bruising, bleeding, bleeding gums Neuro: Denies headache, numbness, weakness, slurred speech, loss of memory or consciousness  Past Medical History:  He,  has a past medical history of Diabetes mellitus without complication ([place]), GI bleed, left renal ca (d'd 2008), 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   Surgical History:   Past Surgical History:  Procedure Laterality Date  . BIOPSY  08/28/2019   Procedure: BIOPSY;  Surgeon: Thornton Park, MD;  Location: [facility];  Service: Gastroenterology;;  . BIOPSY  03/01/2020   Procedure: BIOPSY;  Surgeon: Mauri Pole, MD;  Location: [facility];  Service: Endoscopy;;  . BIOPSY  12/19/2020   Procedure: BIOPSY;  Surgeon: Yetta Flock, MD;  Location: [facility];  Service:  Gastroenterology;;  . BIOPSY  12/20/2020   Procedure: BIOPSY;  Surgeon: Gatha Mayer, MD;  Location: [facility];  Service: Endoscopy;;  . COLONOSCOPY W/ POLYPECTOMY    . COLONOSCOPY WITH PROPOFOL N/A 03/01/2020   Procedure: COLONOSCOPY WITH PROPOFOL;  Surgeon: Mauri Pole, MD;  Location: [facility];  Service: Endoscopy;  Laterality: N/A;  . COLONOSCOPY WITH PROPOFOL N/A 12/20/2020   Procedure: COLONOSCOPY WITH PROPOFOL;  Surgeon: Gatha Mayer, MD;  Location: [facility];  Service: Endoscopy;  Laterality: N/A;  . ESOPHAGOGASTRODUODENOSCOPY (EGD) WITH PROPOFOL N/A 08/28/2019   Procedure: ESOPHAGOGASTRODUODENOSCOPY (EGD) WITH PROPOFOL;  Surgeon: Thornton Park, MD;  Location: [facility];  Service: Gastroenterology;  Laterality: N/A;  . ESOPHAGOGASTRODUODENOSCOPY (EGD) WITH PROPOFOL N/A 03/01/2020   Procedure: ESOPHAGOGASTRODUODENOSCOPY (EGD) WITH PROPOFOL;  Surgeon: Mauri Pole, MD;  [facility]: [facility];  Service: Endoscopy;  Laterality: N/A;  . ESOPHAGOGASTRODUODENOSCOPY (EGD) WITH PROPOFOL N/A 12/19/2020   Procedure: ESOPHAGOGASTRODUODENOSCOPY (EGD) WITH PROPOFOL;  Surgeon: Yetta Flock, MD;  Location: [facility];  Service: Gastroenterology;  Laterality: N/A;  . FEMUR IM NAIL Right 02/16/2018   Procedure: INTRAMEDULLARY (IM) NAIL RIGHT FEMORAL;  Surgeon: Paralee Cancel, MD;  Location: [facility];  Service: Orthopedics;  Laterality: Right;  . GIVENS CAPSULE STUDY N/A 03/03/2020   Procedure: GIVENS CAPSULE STUDY;  Surgeon: Mauri Pole, MD;  Location: [facility];  Service: Endoscopy;  Laterality: N/A;  . HAND SURGERY Right   . HEMOSTASIS CLIP PLACEMENT  12/20/2020   Procedure: HEMOSTASIS CLIP PLACEMENT;  Surgeon: Gatha Mayer, MD;  Location: [facility];  Service: Endoscopy;;  . HOT HEMOSTASIS N/A 12/20/2020   Procedure: HOT HEMOSTASIS (ARGON PLASMA COAGULATION/BICAP);  Surgeon: Gatha Mayer, MD;  Location[facility];  Service: Endoscopy;   Laterality: N/A;  . IR ANGIOGRAM EXTREMITY RIGHT  02/15/2018  . IR ANGIOGRAM SELECTIVE EACH ADDITIONAL VESSEL  02/15/2018  . IR ANGIOGRAM SELECTIVE EACH ADDITIONAL VESSEL  02/15/2018  . IR ANGIOGRAM SELECTIVE EACH ADDITIONAL VESSEL  02/15/2018  . IR ANGIOGRAM SELECTIVE EACH ADDITIONAL VESSEL  02/15/2018  . IR EMBO TUMOR ORGAN ISCHEMIA INFARCT INC GUIDE ROADMAPPING  02/15/2018  . IR EMBO TUMOR ORGAN ISCHEMIA INFARCT INC GUIDE ROADMAPPING  02/15/2018  . IR US GUIDE VASC ACCESS LEFT  02/15/2018  . LUNG REMOVAL, PARTIAL Right 09/2008  . NEPHRECTOMY RADICAL     Left   . PAIN PUMP IMPLANTATION N/A 05/16/2015   Procedure: Intrathecal pain pump placement;  Surgeon: Clydell Hakim, MD;  Location: [facility];  Service: Neurosurgery;  Laterality: N/A;  Intrathecal pain pump placement     Social History:   reports that he has been smoking cigarettes. He has a 15.00 pack-year  smoking history. He has never used smokeless tobacco. He reports current drug use. Drug: Marijuana. He reports that he does not drink alcohol.   Family History:  His family history includes Cancer in his maternal aunt; Diabetes in his brother; Hypertension in his mother; Irritable bowel syndrome in his sister. There is no history of Colon cancer.   Allergies Allergies  Allergen Reactions  . Ceftriaxone Hives  . Hydrocodone Swelling     Home Medications  Prior to Admission medications   Medication Sig Start Date End Date Taking? Authorizing Provider  acetaminophen (TYLENOL) 325 MG tablet Take 650 mg by mouth every 6 (six) hours as needed.   Yes [provider]  ALPRAZolam (XANAX) 1 MG tablet TAKE 1 TABLET BY MOUTH THREE TIMES A DAY AS NEEDED FOR ANXIETY Patient taking differently: Take 1 mg by mouth 3 (three) times daily as needed for anxiety. 01/11/21  Yes [name], MD  amLODipine (NORVASC) 5 MG tablet Take 1 tablet (5 mg total) by mouth daily. 12/21/20 12/21/21 Yes Oswald Hillock, MD  diphenhydrAMINE (BENADRYL) 25  MG tablet Take 25 mg by mouth every 6 (six) hours as needed for allergies.   Yes [provider]  furosemide (LASIX) 20 MG tablet Take 1 tablet (20 mg total) by mouth daily. 01/22/21  Yes [name], MD  HYDROmorphone (DILAUDID) 8 MG tablet Take 1 tablet (8 mg total) by mouth every 4 (four) hours as needed for severe pain. Patient taking differently: Take 8 mg by mouth every 4 (four) hours as needed for moderate pain or severe pain. 02/17/18  Yes Babish, Rodman Key, PA-C  ondansetron (ZOFRAN) 4 MG tablet Take one tablet every morning.  Can take one tablet later in the day on an as needed basis Patient taking differently: Take 4 mg by mouth daily. 10/10/20  Yes [name], MD  PAIN MANAGEMENT INTRATHECAL, IT, PUMP 1 each by Intrathecal route continuous. Intrathecal (IT) medication:   Morphine.   Yes [provider]  pantoprazole (PROTONIX) 40 MG tablet Take 1 tablet (40 mg total) by mouth daily. 12/21/20  Yes Lama, Marge Duncans, MD  XARELTO 20 MG TABS tablet TAKE 1 TABLET (20 MG TOTAL) BY MOUTH DAILY WITH SUPPER. Patient taking differently: Take 20 mg by mouth daily. 05/14/20  Yes [name], MD  Cyanocobalamin (B-12) 1000 MCG TABS Take 1 tablet (1,000 mcg total) by mouth daily. Patient not taking: Reported on 01/31/2021 12/31/20   Willia Craze, NP  ferrous sulfate 325 (65 FE) MG tablet TAKE 1 TABLET BY MOUTH EVERY DAY WITH BREAKFAST Patient taking differently: Take 325 mg by mouth daily. 10/24/20   Zehr, Laban Emperor, PA-C  DULoxetine (CYMBALTA) 60 MG capsule TAKE 1 CAPSULE (60 MG TOTAL) BY MOUTH 2 (TWO) TIMES DAILY. Patient not taking: Reported on 12/18/2020 12/26/19 12/18/20  [name], MD     Signature:   Johnsie Cancel, NP-C [facility] Personal contact information can be found on Amion  If no response please page: Adult pulmonary and critical care medicine pager on Amion unitl 7pm After 7pm please call [phone] 01/31/2021, 1:37  PM

## 2021-01-31 NOTE — ED Triage Notes (Signed)
Pt complains of being short of breath acutely about one hour ago, he tried to go to the bathroom and became extremely short of breath Pt also has swollen feet and swollen elbows that he said has been about two weeks

## 2021-01-31 NOTE — ED Provider Notes (Signed)
[facility] DEPT Provider Note: Georgena Spurling, MD, FACEP  CSN: [id number] MRN: [id number] ARRIVAL: 01/31/21 at [place]: [place]  01/31/21 5:44 AM Keith [name] is a 55 y.o. male with metastatic renal cell carcinoma status post left nephrectomy.  He has had a general decline over the past 2 weeks.  Specifically he has had increasing shortness of breath, generalized weakness and lower extremity edema.  His shortness of breath acutely worsened this morning which he noticed when he got up to go to the bathroom just prior to arrival.  Has dyspnea was moderate to severe, worse with exertion and better with rest.  Although he has a documented oxygen saturation of 97% on arrival when I entered the room his oxygen saturation was 88-89% on room air.  He denies any acute pain.  He has an intrathecal pain pump in his left lower abdomen which infuses morphine.  He has not had a fever.  He is on Xarelto and has a history of recurrent GI bleeding with chronic anemia and need for periodic blood transfusions.  He was last seen by gastroenterology on 01/23/2021.   Past Medical History:  Diagnosis Date  . Diabetes mellitus without complication ([place])    diet controlled only  . GI bleed   . left renal ca d'd 2008  . Malignant neoplasm metastatic to pancreas ([place]) 2012  . 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 Metastasis to bone [facility]) 2014/2016    Past Surgical History:  Procedure Laterality Date  . BIOPSY  08/28/2019   Procedure: BIOPSY;  Surgeon: Thornton Park, MD;  Location: [facility];  Service: Gastroenterology;;  . BIOPSY  03/01/2020   Procedure: BIOPSY;  Surgeon: Mauri Pole, MD;  Location: [facility];  Service: Endoscopy;;  . BIOPSY  12/19/2020   Procedure: BIOPSY;  Surgeon: Yetta Flock, MD;  Location: [facility];  Service: Gastroenterology;;  . BIOPSY  12/20/2020   Procedure: BIOPSY;  Surgeon: Gatha Mayer,  MD;  Location: [facility];  Service: Endoscopy;;  . COLONOSCOPY W/ POLYPECTOMY    . COLONOSCOPY WITH PROPOFOL N/A 03/01/2020   Procedure: COLONOSCOPY WITH PROPOFOL;  Surgeon: Mauri Pole, MD;  [facility];  Service: Endoscopy;  Laterality: N/A;  . COLONOSCOPY WITH PROPOFOL N/A 12/20/2020   Procedure: COLONOSCOPY WITH PROPOFOL;  Surgeon: Gatha Mayer, MD;  [facility];  Service: Endoscopy;  Laterality: N/A;  . ESOPHAGOGASTRODUODENOSCOPY (EGD) WITH PROPOFOL N/A 08/28/2019   Procedure: ESOPHAGOGASTRODUODENOSCOPY (EGD) WITH PROPOFOL;  Surgeon: Thornton Park, MD;  [facility];  Service: Gastroenterology;  Laterality: N/A;  . ESOPHAGOGASTRODUODENOSCOPY (EGD) WITH PROPOFOL N/A 03/01/2020   Procedure: ESOPHAGOGASTRODUODENOSCOPY (EGD) WITH PROPOFOL;  Surgeon: Mauri Pole, MD;  [facility];  Service: Endoscopy;  Laterality: N/A;  . ESOPHAGOGASTRODUODENOSCOPY (EGD) WITH PROPOFOL N/A 12/19/2020   Procedure: ESOPHAGOGASTRODUODENOSCOPY (EGD) WITH PROPOFOL;  Surgeon: Yetta Flock, MD;  [facility];  Service: Gastroenterology;  Laterality: N/A;  . FEMUR IM NAIL Right 02/16/2018   Procedure: INTRAMEDULLARY (IM) NAIL RIGHT FEMORAL;  Surgeon: Paralee Cancel, MD;  Location: [facility];  Service: Orthopedics;  Laterality: Right;  . GIVENS CAPSULE STUDY N/A 03/03/2020   Procedure: GIVENS CAPSULE STUDY;  Surgeon: Mauri Pole, MD;  [facility];  Service: Endoscopy;  Laterality: N/A;  . HAND SURGERY Right   . HEMOSTASIS CLIP PLACEMENT  12/20/2020   Procedure: HEMOSTASIS CLIP PLACEMENT;  Surgeon: Gatha Mayer, MD;  [facility];  Service: Endoscopy;;  . HOT HEMOSTASIS N/A 12/20/2020   Procedure: HOT HEMOSTASIS (ARGON PLASMA COAGULATION/BICAP);  Surgeon: Gatha Mayer, MD;  Location: [facility];  Service: Endoscopy;  Laterality: N/A;  . IR ANGIOGRAM EXTREMITY RIGHT  02/15/2018  . IR ANGIOGRAM SELECTIVE EACH  ADDITIONAL VESSEL  02/15/2018  . IR ANGIOGRAM SELECTIVE EACH ADDITIONAL VESSEL  02/15/2018  . IR ANGIOGRAM SELECTIVE EACH ADDITIONAL VESSEL  02/15/2018  . IR ANGIOGRAM SELECTIVE EACH ADDITIONAL VESSEL  02/15/2018  . IR EMBO TUMOR ORGAN ISCHEMIA INFARCT INC GUIDE ROADMAPPING  02/15/2018  . IR EMBO TUMOR ORGAN ISCHEMIA INFARCT INC GUIDE ROADMAPPING  02/15/2018  . IR US GUIDE VASC ACCESS LEFT  02/15/2018  . LUNG REMOVAL, PARTIAL Right 09/2008  . NEPHRECTOMY RADICAL     Left   . PAIN PUMP IMPLANTATION N/A 05/16/2015   Procedure: Intrathecal pain pump placement;  Surgeon: Clydell Hakim, MD;  Location: [facility];  Service: Neurosurgery;  Laterality: N/A;  Intrathecal pain pump placement    Family History  Problem Relation Age of Onset  . Hypertension Mother   . Diabetes Brother   . Irritable bowel syndrome Sister   . Cancer Maternal Aunt        ovarian  . Colon cancer Neg Hx     Social History   Tobacco Use  . Smoking status: Current Every Day Smoker    Packs/day: 0.50    Years: 30.00    Pack years: 15.00    Types: Cigarettes  . Smokeless tobacco: Never Used  Vaping Use  . Vaping Use: Never used  Substance Use Topics  . Alcohol use: No  . Drug use: Yes    Types: Marijuana    Comment: occasional. Last used: last night.     Prior to Admission medications   Medication Sig Start Date End Date Taking? Authorizing Provider  acetaminophen (TYLENOL) 325 MG tablet Take 650 mg by mouth every 6 (six) hours as needed.   Yes [provider]  ALPRAZolam (XANAX) 1 MG tablet TAKE 1 TABLET BY MOUTH THREE TIMES A DAY AS NEEDED FOR ANXIETY Patient taking differently: Take 1 mg by mouth 3 (three) times daily as needed for anxiety. 01/11/21  Yes [name], MD  amLODipine (NORVASC) 5 MG tablet Take 1 tablet (5 mg total) by mouth daily. 12/21/20 12/21/21 Yes Oswald Hillock, MD  diphenhydrAMINE (BENADRYL) 25 MG tablet Take 25 mg by mouth every 6 (six) hours as needed for allergies.   Yes  [provider]  furosemide (LASIX) 20 MG tablet Take 1 tablet (20 mg total) by mouth daily. 01/22/21  Yes [name], MD  HYDROmorphone (DILAUDID) 8 MG tablet Take 1 tablet (8 mg total) by mouth every 4 (four) hours as needed for severe pain. Patient taking differently: Take 8 mg by mouth every 4 (four) hours as needed for moderate pain or severe pain. 02/17/18  Yes Babish, Rodman Key, PA-C  ondansetron (ZOFRAN) 4 MG tablet Take one tablet every morning.  Can take one tablet later in the day on an as needed basis Patient taking differently: Take 4 mg by mouth daily. 10/10/20  Yes [name], MD  PAIN MANAGEMENT INTRATHECAL, IT, PUMP 1 each by Intrathecal route continuous. Intrathecal (IT) medication:   Morphine.   Yes [provider]  pantoprazole (PROTONIX) 40 MG tablet Take 1 tablet (40 mg total) by mouth daily. 12/21/20  Yes Lama, Marge Duncans, MD  XARELTO 20 MG TABS tablet TAKE 1  TABLET (20 MG TOTAL) BY MOUTH DAILY WITH SUPPER. Patient taking differently: Take 20 mg by mouth daily. 05/14/20  Yes [name], MD  Cyanocobalamin (B-12) 1000 MCG TABS Take 1 tablet (1,000 mcg total) by mouth daily. Patient not taking: Reported on 01/31/2021 12/31/20   Willia Craze, NP  ferrous sulfate 325 (65 FE) MG tablet TAKE 1 TABLET BY MOUTH EVERY DAY WITH BREAKFAST Patient taking differently: Take 325 mg by mouth daily. 10/24/20   Zehr, Laban Emperor, PA-C  DULoxetine (CYMBALTA) 60 MG capsule TAKE 1 CAPSULE (60 MG TOTAL) BY MOUTH 2 (TWO) TIMES DAILY. Patient not taking: Reported on 12/18/2020 12/26/19 12/18/20  [name], MD    Allergies Ceftriaxone and Hydrocodone   REVIEW OF SYSTEMS  Negative except as noted here or in the History of Present Illness.   PHYSICAL EXAMINATION  Initial Vital Signs Blood pressure (!) 165/97, pulse (!) 104, temperature 97.7 F (36.5 C), temperature source Oral, resp. rate (!) 21, SpO2 97 %.  Examination General: Well-developed, thin male in no  acute distress; appears older than age of record HENT: normocephalic; atraumatic Eyes: pupils equal, round and reactive to light; extraocular muscles intact Neck: supple Heart: regular rate and rhythm Lungs: clear to auscultation bilaterally Abdomen: soft; nondistended; nontender; device consistent with reported pain pump present in left lower quadrant; bowel sounds present Extremities: No deformity; full range of motion; 2+ edema of lower extremities Neurologic: Awake, alert and oriented; motor function intact in all extremities and symmetric; no facial droop Skin: Warm and dry; pallor Psychiatric: Normal mood and affect   RESULTS  Summary of this visit's results, reviewed and interpreted by myself:   EKG Interpretation  Date/Time:    Ventricular Rate:    PR Interval:    QRS Duration:   QT Interval:    QTC Calculation:   R Axis:     Text Interpretation:        Laboratory Studies: Results for orders placed or performed during the hospital encounter of 01/31/21 (from the past 24 hour(s))  Resp Panel by RT-PCR (Flu A&B, Covid) Nasopharyngeal Swab     Status: None   Collection Time: 01/31/21  5:47 AM   Specimen: Nasopharyngeal Swab; Nasopharyngeal(NP) swabs in vial transport medium  Result Value Ref Range   SARS Coronavirus 2 by RT PCR NEGATIVE NEGATIVE   Influenza A by PCR NEGATIVE NEGATIVE   Influenza B by PCR NEGATIVE NEGATIVE  Comprehensive metabolic panel     Status: Abnormal   Collection Time: 01/31/21  6:06 AM  Result Value Ref Range   Sodium 139 135 - 145 mmol/L   Potassium 5.0 3.5 - 5.1 mmol/L   Chloride 104 98 - 111 mmol/L   CO2 26 22 - 32 mmol/L   Glucose, Bld 125 (H) 70 - 99 mg/dL   BUN 34 (H) 6 - 20 mg/dL   Creatinine, Ser 1.80 (H) 0.61 - 1.24 mg/dL   Calcium 8.2 (L) 8.9 - 10.3 mg/dL   Total Protein 5.5 (L) 6.5 - 8.1 g/dL   Albumin 2.2 (L) 3.5 - 5.0 g/dL   AST 14 (L) 15 - 41 U/L   ALT 13 0 - 44 U/L   Alkaline Phosphatase 63 38 - 126 U/L   Total  Bilirubin 0.6 0.3 - 1.2 mg/dL   GFR, Estimated 44 (L) >60 mL/min   Anion gap 9 5 - 15  D-dimer, quantitative (not at [facility])     Status: Abnormal   Collection Time: 01/31/21  6:06  AM  Result Value Ref Range   D-Dimer, Quant 2.80 (H) 0.00 - 0.50 ug/mL-FEU  Brain natriuretic peptide     Status: Abnormal   Collection Time: 01/31/21  6:06 AM  Result Value Ref Range   B Natriuretic Peptide 341.5 (H) 0.0 - 100.0 pg/mL  Type and screen     Status: None (Preliminary result)   Collection Time: 01/31/21  6:06 AM  Result Value Ref Range   ABO/RH(D) A NEG    Antibody Screen NEG    Sample Expiration 02/03/2021,2359    Unit Number [id number]    Blood Component Type RED CELLS,LR    Unit division 00    Status of Unit ISSUED    Transfusion Status OK TO TRANSFUSE    Crossmatch Result      Compatible Performed at [facility] [address] [place], [place] [address]   Troponin I (High Sensitivity)     Status: Abnormal   Collection Time: 01/31/21  6:21 AM  Result Value Ref Range   Troponin I (High Sensitivity) 55 (H) <18 ng/L  CBC with Differential/Platelet     Status: Abnormal   Collection Time: 01/31/21  6:35 AM  Result Value Ref Range   WBC 6.8 4.0 - 10.5 K/uL   RBC 1.90 (L) 4.22 - 5.81 MIL/uL   Hemoglobin 6.1 (LL) 13.0 - 17.0 g/dL   HCT 21.6 (L) 39.0 - 52.0 %   MCV 113.7 (H) 80.0 - 100.0 fL   MCH 32.1 26.0 - 34.0 pg   MCHC 28.2 (L) 30.0 - 36.0 g/dL   RDW 22.4 (H) 11.5 - 15.5 %   Platelets 178 150 - 400 K/uL   nRBC 0.3 (H) 0.0 - 0.2 %   Neutrophils Relative % 80 %   Neutro Abs 5.4 1.7 - 7.7 K/uL   Lymphocytes Relative 8 %   Lymphs Abs 0.6 (L) 0.7 - 4.0 K/uL   Monocytes Relative 10 %   Monocytes Absolute 0.7 0.1 - 1.0 K/uL   Eosinophils Relative 1 %   Eosinophils Absolute 0.1 0.0 - 0.5 K/uL   Basophils Relative 1 %   Basophils Absolute 0.1 0.0 - 0.1 K/uL   Immature Granulocytes 0 %   Abs Immature Granulocytes 0.02 0.00 - 0.07 K/uL  Prepare RBC (crossmatch)      Status: None   Collection Time: 01/31/21  8:43 AM  Result Value Ref Range   Order Confirmation      ORDER PROCESSED BY BLOOD BANK Performed at [facility], [place] [address], [place], [place] [address]   Troponin I (High Sensitivity)     Status: Abnormal   Collection Time: 01/31/21  9:20 AM  Result Value Ref Range   Troponin I (High Sensitivity) 29 (H) <18 ng/L  Urinalysis, Routine w reflex microscopic Urine, Clean Catch     Status: Abnormal   Collection Time: 01/31/21  9:28 AM  Result Value Ref Range   Color, Urine YELLOW YELLOW   APPearance CLEAR CLEAR   Specific Gravity, Urine 1.008 1.005 - 1.030   pH 5.0 5.0 - 8.0   Glucose, UA NEGATIVE NEGATIVE mg/dL   Hgb urine dipstick SMALL (A) NEGATIVE   Bilirubin Urine NEGATIVE NEGATIVE   Ketones, ur NEGATIVE NEGATIVE mg/dL   Protein, ur 100 (A) NEGATIVE mg/dL   Nitrite NEGATIVE NEGATIVE   Leukocytes,Ua NEGATIVE NEGATIVE   RBC / HPF 0-5 0 - 5 RBC/hpf   WBC, UA 0-5 0 - 5 WBC/hpf   Bacteria, UA RARE (  A) NONE SEEN   Mucus PRESENT   Hemoglobin and hematocrit, blood     Status: Abnormal   Collection Time: 01/31/21  5:45 PM  Result Value Ref Range   Hemoglobin 7.4 (L) 13.0 - 17.0 g/dL   HCT 27.1 (L) 39.0 - 52.0 %   *Note: Due to a large number of results and/or encounters for the requested time period, some results have not been displayed. A complete set of results can be found in Results Review.   Imaging Studies: CT ABDOMEN PELVIS W WO CONTRAST  Result Date: 01/31/2021 CLINICAL DATA:  Restaging metastatic left renal cancer, prior nephrectomy. Discontinuation of oral chemotherapy. Shortness of breath. EXAM: CT CHEST WITH CONTRAST CT ABDOMEN AND PELVIS WITH AND WITHOUT CONTRAST TECHNIQUE: Multidetector CT imaging of the chest was performed during intravenous contrast administration. Multidetector CT imaging of the abdomen and pelvis was performed following the standard protocol before and during bolus administration of  intravenous contrast. CONTRAST:  53m OMNIPAQUE IOHEXOL 300 MG/ML  SOLN COMPARISON:  11/20/2020 FINDINGS: CT CHEST FINDINGS Cardiovascular: No significant thoracic atherosclerosis. Please note that today's exam was not performed as a CT angiogram and is not considered highly sensitive for pulmonary embolus. Stable small pericardial effusion. Mediastinum/Nodes: 1.3 by 0.9 cm hypodense nodule anteriorly in the right thyroid lobe on image 6 of series 12. Not clinically significant; no follow-up imaging recommended (ref: J Am Coll Radiol. 2015 Feb;12(2): 143-50). There is background stranding in the mediastinum which matches the background stranding in the subcutaneous tissues and likely reflects third spacing of fluid. Lungs/Pleura: Predominantly left lower lobe perihilar mass measures 4.3 by 3.4 cm on image 50 of series 12, previously 3.1 by 3.2 cm by my measurements. Right middle lobe and right upper lobe peribronchovascular mass measures 2.4 cm along its short axis, previously 2.1 cm. Stable thick bandlike scarring with volume loss in the right upper lobe. Underlying emphysema. Scattered scarring in the lungs. There is some hazy ground-glass opacity in the lungs which is increased from prior and could be a subtle manifestation of edema or third spacing of fluid. Moderate to large left pleural effusion has increased from prior. Small right pleural effusion appear stable. Musculoskeletal: Thoracic spondylosis. Stable small anterior sclerotic lesion in the T2 vertebral body. Anomalous medial left second rib. Observe 0 catheter noted terminating at about the T4 level posteriorly. CT ABDOMEN AND PELVIS FINDINGS Hepatobiliary: No well delineated hepatic lesions are observed. Gallbladder unremarkable. Pancreas: Dorsal pancreatic duct dilatation extending to a pancreatic body mass with enhancing portion of this mass measuring 3.6 by 2.0 cm on image 41 of series 7, previously 3.6 by 1.9 cm. Along the anterior margin of the  pancreatic head, a 3.1 by 2.3 cm mass is present and previously measured 2.8 by 2.4 cm. The pancreatic lesions are roughly stable. Questionable third small pancreatic nodule along the pancreatic head on image 54 of series 7 measuring 0.9 cm in diameter, stable. Possible fourth pancreatic nodule along the anterior portion of the pancreatic tail measuring 1.0 cm in diameter, stable. Spleen: Unremarkable Adrenals/Urinary Tract: Left nephrectomy. Adrenal glands unremarkable. The right kidney appears unremarkable. No urinary tract calculi are observed. Urinary bladder unremarkable. Stomach/Bowel: No dilated bowel. Similar appearance of mild diffuse rectal wall thickening compared to previous, possibly incidental. Vascular/Lymphatic: Aortoiliac atherosclerotic vascular disease. No pathologic adenopathy. Reproductive: Unremarkable Other: Diffuse subcutaneous and mesenteric edema suggesting third spacing of fluid. Trace free pelvic fluid, improved from prior. Diffuse stranding in the perirectal space. Musculoskeletal: Left lower abdominal wall infusion  pump. Necks chloride akin lucent lesion of the right sacral ala probably representing a metastatic lesion and not appreciably changed from prior. Mild sclerosis along the SI joints. Right proximal femur ORIF, with a lucency in the right proximal femur shown on image 76 of series 13 and also noted on prior exam. Heterotopic ossification above the greater trochanter. Sharply defined lytic lesion of the right inferior pubic ramus measuring 2.4 by 1.3 cm on image 217 of series 12 with sclerotic margination, previously 2.2 by 1.3 cm. IMPRESSION: 1. Mild enlargement of the left lower lobe perihilar mass, and of the right middle lobe/right upper lobe peribronchovascular mass. Increased left pleural effusion, now moderate to large. Stable small right pleural effusion. Stable small pericardial effusion. 2. Diffuse subcutaneous, mesenteric, and mediastinal edema compatible with third  spacing of fluid. There is some hazy density in the lungs which also may be due to noncardiogenic edema. 3. Stable appearance of multiple pancreatic masses. 4. Stable osseous manifestations of metastatic disease, with the largest lesion in the right sacrum. 5. Emphysema and aortic atherosclerosis. Aortic Atherosclerosis (ICD10-I70.0) and Emphysema (ICD10-J43.9). Electronically Signed   By: Van Clines M.D.   On: 01/31/2021 10:56   DG Chest 2 View  Result Date: 01/31/2021 CLINICAL DATA:  Shortness of breath.  Metastatic renal cell cancer. EXAM: CHEST - 2 VIEW COMPARISON:  06/30/2020 FINDINGS: Interval progression of spiculated nodular opacity in the suprahilar right lung with progressive right infrahilar opacity in this patient with known metastatic renal cell carcinoma. New left base collapse/consolidation with small left pleural effusion. Tiny right pleural effusion is similar to prior. The cardio pericardial silhouette is enlarged. The visualized bony structures of the thorax show no acute abnormality. IMPRESSION: 1. Interval progression of right suprahilar and right infrahilar pulmonary metastatic disease. 2. New left base collapse/consolidation with small left pleural effusion. Findings may be related to metastatic involvement or pneumonia. Electronically Signed   By: Misty Stanley M.D.   On: 01/31/2021 06:24   CT CHEST W CONTRAST  Result Date: 01/31/2021 CLINICAL DATA:  Restaging metastatic left renal cancer, prior nephrectomy. Discontinuation of oral chemotherapy. Shortness of breath. EXAM: CT CHEST WITH CONTRAST CT ABDOMEN AND PELVIS WITH AND WITHOUT CONTRAST TECHNIQUE: Multidetector CT imaging of the chest was performed during intravenous contrast administration. Multidetector CT imaging of the abdomen and pelvis was performed following the standard protocol before and during bolus administration of intravenous contrast. CONTRAST:  49m OMNIPAQUE IOHEXOL 300 MG/ML  SOLN COMPARISON:  11/20/2020  FINDINGS: CT CHEST FINDINGS Cardiovascular: No significant thoracic atherosclerosis. Please note that today's exam was not performed as a CT angiogram and is not considered highly sensitive for pulmonary embolus. Stable small pericardial effusion. Mediastinum/Nodes: 1.3 by 0.9 cm hypodense nodule anteriorly in the right thyroid lobe on image 6 of series 12. Not clinically significant; no follow-up imaging recommended (ref: J Am Coll Radiol. 2015 Feb;12(2): 143-50). There is background stranding in the mediastinum which matches the background stranding in the subcutaneous tissues and likely reflects third spacing of fluid. Lungs/Pleura: Predominantly left lower lobe perihilar mass measures 4.3 by 3.4 cm on image 50 of series 12, previously 3.1 by 3.2 cm by my measurements. Right middle lobe and right upper lobe peribronchovascular mass measures 2.4 cm along its short axis, previously 2.1 cm. Stable thick bandlike scarring with volume loss in the right upper lobe. Underlying emphysema. Scattered scarring in the lungs. There is some hazy ground-glass opacity in the lungs which is increased from prior and could be a subtle  manifestation of edema or third spacing of fluid. Moderate to large left pleural effusion has increased from prior. Small right pleural effusion appear stable. Musculoskeletal: Thoracic spondylosis. Stable small anterior sclerotic lesion in the T2 vertebral body. Anomalous medial left second rib. Observe 0 catheter noted terminating at about the T4 level posteriorly. CT ABDOMEN AND PELVIS FINDINGS Hepatobiliary: No well delineated hepatic lesions are observed. Gallbladder unremarkable. Pancreas: Dorsal pancreatic duct dilatation extending to a pancreatic body mass with enhancing portion of this mass measuring 3.6 by 2.0 cm on image 41 of series 7, previously 3.6 by 1.9 cm. Along the anterior margin of the pancreatic head, a 3.1 by 2.3 cm mass is present and previously measured 2.8 by 2.4 cm. The  pancreatic lesions are roughly stable. Questionable third small pancreatic nodule along the pancreatic head on image 54 of series 7 measuring 0.9 cm in diameter, stable. Possible fourth pancreatic nodule along the anterior portion of the pancreatic tail measuring 1.0 cm in diameter, stable. Spleen: Unremarkable Adrenals/Urinary Tract: Left nephrectomy. Adrenal glands unremarkable. The right kidney appears unremarkable. No urinary tract calculi are observed. Urinary bladder unremarkable. Stomach/Bowel: No dilated bowel. Similar appearance of mild diffuse rectal wall thickening compared to previous, possibly incidental. Vascular/Lymphatic: Aortoiliac atherosclerotic vascular disease. No pathologic adenopathy. Reproductive: Unremarkable Other: Diffuse subcutaneous and mesenteric edema suggesting third spacing of fluid. Trace free pelvic fluid, improved from prior. Diffuse stranding in the perirectal space. Musculoskeletal: Left lower abdominal wall infusion pump. Necks chloride akin lucent lesion of the right sacral ala probably representing a metastatic lesion and not appreciably changed from prior. Mild sclerosis along the SI joints. Right proximal femur ORIF, with a lucency in the right proximal femur shown on image 76 of series 13 and also noted on prior exam. Heterotopic ossification above the greater trochanter. Sharply defined lytic lesion of the right inferior pubic ramus measuring 2.4 by 1.3 cm on image 217 of series 12 with sclerotic margination, previously 2.2 by 1.3 cm. IMPRESSION: 1. Mild enlargement of the left lower lobe perihilar mass, and of the right middle lobe/right upper lobe peribronchovascular mass. Increased left pleural effusion, now moderate to large. Stable small right pleural effusion. Stable small pericardial effusion. 2. Diffuse subcutaneous, mesenteric, and mediastinal edema compatible with third spacing of fluid. There is some hazy density in the lungs which also may be due to  noncardiogenic edema. 3. Stable appearance of multiple pancreatic masses. 4. Stable osseous manifestations of metastatic disease, with the largest lesion in the right sacrum. 5. Emphysema and aortic atherosclerosis. Aortic Atherosclerosis (ICD10-I70.0) and Emphysema (ICD10-J43.9). Electronically Signed   By: Van Clines M.D.   On: 01/31/2021 10:56    ED COURSE and MDM  Nursing notes, initial and subsequent vitals signs, including pulse oximetry, reviewed and interpreted by myself.  Vitals:   01/31/21 1230 01/31/21 1330 01/31/21 1729 01/31/21 1900  BP: (!) 138/100 (!) 136/91 (!) 154/99 (!) 149/96  Pulse:  (!) 123 96 99  Resp: 12 14 19 18   Temp:  97.6 F (36.4 C)    TempSrc:      SpO2:  95% 100% 100%   Medications  acetaminophen (TYLENOL) tablet 650 mg (has no administration in time range)  HYDROmorphone (DILAUDID) tablet 8 mg (8 mg Oral Given 01/31/21 1828)  ALPRAZolam (XANAX) tablet 1 mg (1 mg Oral Given 01/31/21 1828)  pantoprazole (PROTONIX) EC tablet 40 mg (40 mg Oral Given 01/31/21 1306)  furosemide (LASIX) injection 40 mg (40 mg Intravenous Given 01/31/21 1828)  sodium chloride flush (  NS) 0.9 % injection 3 mL (3 mLs Intravenous Given 01/31/21 1000)  ondansetron (ZOFRAN) tablet 4 mg (has no administration in time range)    Or  ondansetron (ZOFRAN) injection 4 mg (has no administration in time range)  metoprolol tartrate (LOPRESSOR) injection 5 mg (has no administration in time range)  Morphine INTRATHECAL pump (has no administration in time range)  furosemide (LASIX) injection 40 mg (40 mg Intravenous Given 01/31/21 0734)  0.9 %  sodium chloride infusion (Manually program via Guardrails IV Fluids) ( Intravenous Stopped 01/31/21 1401)  furosemide (LASIX) injection 20 mg (20 mg Intravenous Given 01/31/21 1323)  iohexol (OMNIPAQUE) 300 MG/ML solution 100 mL (75 mLs Intravenous Contrast Given 01/31/21 1020)    7:03 AM Signed out to Dr. Maryan Rued.  It is unclear at this time if the  patient's anasarca is due to volume overload, congestive heart failure, hypoalbuminemia or other etiology.  Will await results of diagnostic tests.  Anticipate patient will need an acute transfusion for ongoing blood loss anemia.  PROCEDURES  Procedures   ED DIAGNOSES     ICD-10-CM   1. Shortness of breath  R06.02   2. Anasarca  R60.1   3. AKI (acute kidney injury) ([place])  N17.9   4. 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 Anemia due to blood loss  D50.0        Toshie Demelo, Jenny Reichmann, MD 01/31/21 2228

## 2021-01-31 NOTE — ED Provider Notes (Signed)
Patient was checked out to me at 7 AM.  Patient with multiple medical problems including recurrent anemia from GI bleeding of but no known source.  However patient is now having diffuse edema and has almost anasarca on exam.  Upper extremities, scrotum and lower extremities are swollen.  He is having symptoms of fluid overload with orthopnea at night and dyspnea on exertion.  Patient is anemic today with a hemoglobin of 6.1 but also has a troponin leak, new pleural effusion, new AKI and BNP that is more elevated than prior.  Feel that patient needs diuresis and then may also need blood.  Patient is requiring oxygen due to some hypoxia upon arrival here.  He is in no acute distress while seated in the bed.  He does report that they have discontinued a lot of medications in the last month but he is still taking Lasix 20 mg.  Last echo was 2016.  Will admit for further care.   Blanchie Dessert, MD 01/31/21 [phone]

## 2021-01-31 NOTE — Progress Notes (Signed)
Results of his CT scan personally reviewed and showed progression of disease predominantly increase of his pulmonary nodules and pleural effusion.  Pleural effusion could be related to generalized anasarca and not malignant.  Given his ongoing GI bleed, I recommended discontinuation of Xarelto permanently once his capsule endoscopy is completed per Dr. Ardis Hughs.  HIs pulmonary embolism has been remote and that was incidentally found on routine staging scans.  I will address is future cancer treatment options with him with my follow-up evaluation on February 01, 2021.  The time being, I agree with supportive management clinic diuresis and thoracentesis once he is off Xarelto.  We will continue to follow.

## 2021-01-31 NOTE — H&P (Addendum)
History and Physical        Hospital Admission Note Date: 01/31/2021  Patient name: Keith [name] Medical record number: [id number] Date of birth: May 05, 1966 Age: 55 y.o. Gender: male  PCP: [name], MD  Patient coming from: home Lives with: wife At baseline, ambulates: independently  Chief Complaint    Chief Complaint  Patient presents with  . 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 [name] due to anasarca and poor health), tobacco use, cancer related pain with morphine intrathecal pump, PE on Xarelto, recurrent GI bleeding and anemia requiring blood transfusions, hypertension who presented to the ED with generalized weakness, lower extremity edema and shortness of breath x2 weeks which acutely worsened 1 hour prior to arrival when ambulating to the bathroom. Dyspnea is moderate to severe and worse with exertion and better with rest with associated dry cough and scrotal swelling. Also has orthopnea which is new. States that he eats fried food 3 days/week and fast food 3 days/week and also adds salt to food. Also with bright red blood per rectum. Denies chest pain, palpitations, fever. Last dose of Xarelto was night before last.  Of note, patient was last seen by his oncologist on 01/22/2021 and was prescribed Lasix for his generalized anasarca and his cabozantinib was put on hold due to his poor health and generalized anasarca.  Has not had much improvement with the Lasix since.  Also seen by [facility] on 01/24/2021 and plan is for upcoming video capsule study off iron and on Xarelto.  ED Course: Afebrile, hemodynamically stable, Satting 89 to 90% per ED note and  placed on 2 L/min. Notable Labs: Sodium 139, K5.0, glucose 125, BUN 34, creatinine 1.8, albumin 2.2,, BNP 341, troponin 55, WBC 6.8, Hb 6.1, platelets 178, D-dimer 2.8, COVID-19 pending. Notable Imaging: CXR-interval progression of right suprahilar right infrahilar pulmonary metastatic disease, new left base collapse/consolidation with small left pleural effusion which may be related to metastatic involvement or pneumonia. Patient received Lasix 40 mg IV x1.    Vitals:   01/31/21 0534 01/31/21 0722  BP: (!) 165/97 (!) 158/97  Pulse: (!) 104 93  Resp: (!) 21 12  Temp: 97.7 F (36.5 C) (!) 97.4 F (36.3 C)  SpO2: 97% 100%     Review of Systems:  Review of Systems  All other systems reviewed and are negative.   Medical/Social/Family History   Past Medical History: Past Medical History:  Diagnosis Date  . Diabetes mellitus without complication ([place])    diet controlled only  . GI bleed   . left renal ca d'd 2008  . Malignant neoplasm metastatic to pancreas ([place]) 2012  . 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 Metastasis to bone [facility]) 2014/2016    Past Surgical History:  Procedure Laterality Date  . BIOPSY  08/28/2019   Procedure: BIOPSY;  Surgeon: Thornton Park, MD;  Location: [facility];  Service: Gastroenterology;;  . BIOPSY  03/01/2020   Procedure: BIOPSY;  Surgeon: Mauri Pole,  MD;  Location: [facility];  Service: Endoscopy;;  . BIOPSY  12/19/2020   Procedure: BIOPSY;  Surgeon: Benancio Deeds, MD;  Location: [facility];  Service: Gastroenterology;;  . BIOPSY  12/20/2020   Procedure: BIOPSY;  Surgeon: Iva Boop, MD;  Location: [facility];  Service: Endoscopy;;  . COLONOSCOPY W/ POLYPECTOMY    . COLONOSCOPY WITH PROPOFOL N/A 03/01/2020   Procedure: COLONOSCOPY WITH PROPOFOL;  Surgeon: Napoleon Form, MD;  Location: [facility];  Service: Endoscopy;  Laterality: N/A;  . COLONOSCOPY WITH PROPOFOL N/A 12/20/2020   Procedure: COLONOSCOPY WITH PROPOFOL;  Surgeon:  Iva Boop, MD;  Location: [facility];  Service: Endoscopy;  Laterality: N/A;  . ESOPHAGOGASTRODUODENOSCOPY (EGD) WITH PROPOFOL N/A 08/28/2019   Procedure: ESOPHAGOGASTRODUODENOSCOPY (EGD) WITH PROPOFOL;  Surgeon: Tressia Danas, MD;  Location: [facility];  Service: Gastroenterology;  Laterality: N/A;  . ESOPHAGOGASTRODUODENOSCOPY (EGD) WITH PROPOFOL N/A 03/01/2020   Procedure: ESOPHAGOGASTRODUODENOSCOPY (EGD) WITH PROPOFOL;  Surgeon: Napoleon Form, MD;  Location: [facility];  Service: Endoscopy;  Laterality: N/A;  . ESOPHAGOGASTRODUODENOSCOPY (EGD) WITH PROPOFOL N/A 12/19/2020   Procedure: ESOPHAGOGASTRODUODENOSCOPY (EGD) WITH PROPOFOL;  Surgeon: Benancio Deeds, MD;  Location: [facility];  Service: Gastroenterology;  Laterality: N/A;  . FEMUR IM NAIL Right 02/16/2018   Procedure: INTRAMEDULLARY (IM) NAIL RIGHT FEMORAL;  Surgeon: Durene Romans, MD;  Location: [facility];  Service: Orthopedics;  Laterality: Right;  . GIVENS CAPSULE STUDY N/A 03/03/2020   Procedure: GIVENS CAPSULE STUDY;  Surgeon: Napoleon Form, MD;  Location: [facility];  Service: Endoscopy;  Laterality: N/A;  . HAND SURGERY Right   . HEMOSTASIS CLIP PLACEMENT  12/20/2020   Procedure: HEMOSTASIS CLIP PLACEMENT;  Surgeon: Iva Boop, MD;  Location: [facility];  Service: Endoscopy;;  . HOT HEMOSTASIS N/A 12/20/2020   Procedure: HOT HEMOSTASIS (ARGON PLASMA COAGULATION/BICAP);  Surgeon: Iva Boop, MD;  Location: [facility];  Service: Endoscopy;  Laterality: N/A;  . IR ANGIOGRAM EXTREMITY RIGHT  02/15/2018  . IR ANGIOGRAM SELECTIVE EACH ADDITIONAL VESSEL  02/15/2018  . IR ANGIOGRAM SELECTIVE EACH ADDITIONAL VESSEL  02/15/2018  . IR ANGIOGRAM SELECTIVE EACH ADDITIONAL VESSEL  02/15/2018  . IR ANGIOGRAM SELECTIVE EACH ADDITIONAL VESSEL  02/15/2018  . IR EMBO TUMOR ORGAN ISCHEMIA INFARCT INC GUIDE ROADMAPPING  02/15/2018  . IR EMBO TUMOR ORGAN ISCHEMIA INFARCT INC GUIDE ROADMAPPING  02/15/2018  . IR  US GUIDE VASC ACCESS LEFT  02/15/2018  . LUNG REMOVAL, PARTIAL Right 09/2008  . NEPHRECTOMY RADICAL     Left   . PAIN PUMP IMPLANTATION N/A 05/16/2015   Procedure: Intrathecal pain pump placement;  Surgeon: Odette Fraction, MD;  Location: [facility];  Service: Neurosurgery;  Laterality: N/A;  Intrathecal pain pump placement    Medications: Prior to Admission medications   Medication Sig Start Date End Date Taking? Authorizing Provider  acetaminophen (TYLENOL) 325 MG tablet Take 650 mg by mouth every 6 (six) hours as needed.    [provider]  ALPRAZolam Prudy Feeler) 1 MG tablet TAKE 1 TABLET BY MOUTH THREE TIMES A DAY AS NEEDED FOR ANXIETY 01/11/21   Benjiman Core, MD  amLODipine (NORVASC) 5 MG tablet Take 1 tablet (5 mg total) by mouth daily. 12/21/20 12/21/21  Meredeth Ide, MD  calcium carbonate (TUMS - DOSED IN MG ELEMENTAL CALCIUM) 500 MG chewable tablet Chew 1 tablet by mouth 3 (three) times daily as needed for indigestion or heartburn.     [provider]  cetirizine (ZYRTEC) 10 MG tablet  Take 10 mg by mouth daily as needed for allergies.    [provider]  Cyanocobalamin (B-12) 1000 MCG TABS Take 1 tablet (1,000 mcg total) by mouth daily. 12/31/20   Willia Craze, NP  ferrous sulfate 325 (65 FE) MG tablet TAKE 1 TABLET BY MOUTH EVERY DAY WITH BREAKFAST Patient taking differently: Take 325 mg by mouth daily. 10/24/20   Zehr, Laban Emperor, PA-C  furosemide (LASIX) 20 MG tablet Take 1 tablet (20 mg total) by mouth daily. 01/22/21   [name], MD  gabapentin (NEURONTIN) 300 MG capsule TAKE 1 CAPSULE BY MOUTH THREE TIMES A DAY Patient taking differently: Take 300 mg by mouth 3 (three) times daily. 05/10/20   [name], MD  HYDROmorphone (DILAUDID) 8 MG tablet Take 1 tablet (8 mg total) by mouth every 4 (four) hours as needed for severe pain. Patient taking differently: Take 8 mg by mouth every 4 (four) hours as needed for moderate pain or severe pain. 02/17/18    Danae Orleans, PA-C  hydroxypropyl methylcellulose / hypromellose (ISOPTO TEARS / GONIOVISC) 2.5 % ophthalmic solution Place 1 drop into both eyes daily as needed for dry eyes.    [provider]  ondansetron (ZOFRAN) 4 MG tablet Take one tablet every morning.  Can take one tablet later in the day on an as needed basis Patient taking differently: Take 4 mg by mouth daily. 10/10/20   [name], MD  PAIN MANAGEMENT INTRATHECAL, IT, PUMP 1 each by Intrathecal route continuous. Intrathecal (IT) medication:   Morphine.    [provider]  pantoprazole (PROTONIX) 40 MG tablet Take 1 tablet (40 mg total) by mouth daily. 12/21/20   Lama, Marge Duncans, MD  XARELTO 20 MG TABS tablet TAKE 1 TABLET (20 MG TOTAL) BY MOUTH DAILY WITH SUPPER. Patient taking differently: Take 20 mg by mouth daily. 05/14/20   [name], MD  DULoxetine (CYMBALTA) 60 MG capsule TAKE 1 CAPSULE (60 MG TOTAL) BY MOUTH 2 (TWO) TIMES DAILY. Patient not taking: Reported on 12/18/2020 12/26/19 12/18/20  [name], MD    Allergies:   Allergies  Allergen Reactions  . Ceftriaxone Hives  . Hydrocodone Swelling    Social History:  reports that he has been smoking cigarettes. He has a 15.00 pack-year smoking history. He has never used smokeless tobacco. He reports current drug use. Drug: Marijuana. He reports that he does not drink alcohol.  Family History: Family History  Problem Relation Age of Onset  . Hypertension Mother   . Diabetes Brother   . Irritable bowel syndrome Sister   . Cancer Maternal Aunt        ovarian  . Colon cancer Neg Hx      Objective   Physical Exam: Blood pressure (!) 158/97, pulse 93, temperature (!) 97.4 F (36.3 C), temperature source Oral, resp. rate 12, SpO2 100 %.  Physical Exam Vitals and nursing note reviewed.  Constitutional:      Appearance: Normal appearance.  HENT:     Head: Normocephalic and atraumatic.     Mouth/Throat:     Mouth: Mucous membranes  are moist.  Eyes:     Conjunctiva/sclera: Conjunctivae normal.  Cardiovascular:     Rate and Rhythm: Normal rate and regular rhythm.  Pulmonary:     Effort: Pulmonary effort is normal.     Breath sounds: Examination of the left-lower field reveals rales. Rales present.  Abdominal:     General: Abdomen is flat.  Palpations: Abdomen is soft.  Musculoskeletal:        General: No swelling or tenderness.     Comments: 2+ bilateral lower extremity edema Bilateral upper extremity edema R>L  Skin:    Coloration: Skin is pale. Skin is not jaundiced.  Neurological:     Mental Status: He is alert. Mental status is at baseline.  Psychiatric:        Mood and Affect: Mood normal.        Behavior: Behavior normal.     LABS on Admission: I have personally reviewed all the labs and imaging below    Basic Metabolic Panel: Recent Labs  Lab 01/31/21 0606  NA 139  K 5.0  CL 104  CO2 26  GLUCOSE 125*  BUN 34*  CREATININE 1.80*  CALCIUM 8.2*   Liver Function Tests: Recent Labs  Lab 01/31/21 0606  AST 14*  ALT 13  ALKPHOS 63  BILITOT 0.6  PROT 5.5*  ALBUMIN 2.2*   No results for input(s): LIPASE, AMYLASE in the last 168 hours. No results for input(s): AMMONIA in the last 168 hours. CBC: Recent Labs  Lab 01/31/21 0635  WBC 6.8  NEUTROABS 5.4  HGB 6.1*  HCT 21.6*  MCV 113.7*  PLT 178   Cardiac Enzymes: No results for input(s): CKTOTAL, CKMB, CKMBINDEX, TROPONINI in the last 168 hours. BNP: Invalid input(s): POCBNP CBG: No results for input(s): GLUCAP in the last 168 hours.  Radiological Exams on Admission:  DG Chest 2 View  Result Date: 01/31/2021 CLINICAL DATA:  Shortness of breath.  Metastatic renal cell cancer. EXAM: CHEST - 2 VIEW COMPARISON:  06/30/2020 FINDINGS: Interval progression of spiculated nodular opacity in the suprahilar right lung with progressive right infrahilar opacity in this patient with known metastatic renal cell carcinoma. New left base  collapse/consolidation with small left pleural effusion. Tiny right pleural effusion is similar to prior. The cardio pericardial silhouette is enlarged. The visualized bony structures of the thorax show no acute abnormality. IMPRESSION: 1. Interval progression of right suprahilar and right infrahilar pulmonary metastatic disease. 2. New left base collapse/consolidation with small left pleural effusion. Findings may be related to metastatic involvement or pneumonia. Electronically Signed   By: Misty Stanley M.D.   On: 01/31/2021 06:24      EKG: normal sinus rhythm, nonspecific ST and T waves changes in lateral leads   A & P   Principal Problem:   Shortness of breath Active Problems:   Renal cancer (HCC)   Metastasis to lung (HCC)   Benign essential HTN   Symptomatic anemia   Pulmonary embolus, left (HCC)   Metastasis from malignant tumor of kidney (HCC)   Acute GI bleeding   Acute blood loss anemia   Anasarca   1. Shortness of breath, suspect multifactorial: new onset heart failure, symptomatic anemia, pleural effusion a. Intermittently requiring 2 L/min O2 with O2 drop to 89% on room air with conversation b. Likely related to dietary indiscretion/high salt intake c. BNP 341, Hb 6.1 d. Received Lasix 40 mg IV x 1 in the ED e. Will give an additional dose of lasix post 1 u PRBC blood transfusion and continue Lasix 40 mg IV BID f. Low sodium diet g. Monitor I/O and daily weights h. Echo i. Hold Amlodipine as this can also contribute to edema which was started 1.5 months ago j. Addendum: CT chest revealed mod-large left pleural effusion i. PCCM consulted for thora  2. Acute blood loss anemia secondary to recurrent GI  bleed while on Xarelto a. Plan was for video capsule study tomorrow outpatient b. GI consult, discussed with Tye Savoy, NP c. 1 u PRBC transfusion with post transfusion lasix and H/h d. Hold xarelto  3. Metastatic RCC to lung and bone s/p radical nephrectomy  and XRT a. Was on cabozantinib (Cabometyx) which was held at recent visit due to declining health and anasarca b. CXR - Interval progression of right suprahilar and right infrahilar pulmonary metastatic disease c. Oncology consulted, discussed with Dr. [name]  4. Elevated Troponin, suspect demand ischemia a. Troponin 55 b. Nonspecific ST changes on EKG c. Trend troponin  5. PE on Xarelto (diagnosed 2016) a. Hold xarelto as above  6. Hypertension a. Hold amlodipine which was recently started as this can contribute to edema b. PRN lopressor  7. Elevated Creatinine a. Creatinine 1.8, baseline 1.2-1.3 b. Follow up after diuresis and blood transfusions  8. Elevated D Dimer a. Unclear significance as he is already on Xarelto, possibly related to underlying malignancy     DVT prophylaxis: SCD   Code Status: Full Code  Diet: low sodium Family Communication: Admission, patients condition and plan of care including tests being ordered have been discussed with the patient who indicates understanding and agrees with the plan and Code Status. Patient's wife was updated  Disposition Plan: The appropriate patient status for this patient is INPATIENT. Inpatient status is judged to be reasonable and necessary in order to provide the required intensity of service to ensure the patient's safety. The patient's presenting symptoms, physical exam findings, and initial radiographic and laboratory data in the context of their chronic comorbidities is felt to place them at high risk for further clinical deterioration. Furthermore, it is not anticipated that the patient will be medically stable for discharge from the hospital within 2 midnights of admission. The following factors support the patient status of inpatient.   " The patient's presenting symptoms include shortness of breath. " The worrisome physical exam findings include anasarca. " The initial radiographic and laboratory data are worrisome  because of elevated BNP, elevated D Dimer, anemia. " The chronic co-morbidities include metastatic RCC.   * I certify that at the point of admission it is my clinical judgment that the patient will require inpatient hospital care spanning beyond 2 midnights from the point of admission due to high intensity of service, high risk for further deterioration and high frequency of surveillance required.*      The medical decision making on this patient was of high complexity and the patient is at high risk for clinical deterioration, therefore this is a level 3  admission.  Consultants  . GI . Oncology  Procedures  . none  Time Spent on Admission: 80 minutes    Harold Hedge, DO [facility]  01/31/2021, 9:12 AM

## 2021-01-31 NOTE — Consult Note (Addendum)
Referring Provider:  [facility]         Primary Care Physician:  [name], MD Primary Gastroenterologist:  Oretha Caprice, MD                We were asked to see this patient for:  anemia                ASSESSMENT / PLAN:   # 55 yo male metastatic RCC with acute on chronic anemia,  ongoing intermittent GI bleeding on Xarelto   Hospitalized in March 2021 and late December 2021 with GI bleeding and profound anemia on Xarelto. No source of bleeding on EGD, colonoscopy and VCE ( poor visualization) in March nor on EGD and colonoscopy in December. I saw him for office follow up on 01/24/21 and he was still having intermittent red blood with BMs. He is scheduled for repeat VCE tomorrow ON Xarelto and OFF iron but ended up coming to ED for SOB. Hgb down from 8.1 on 01/22/21 to 6.1.  --He is getting blood transfusion now.  --Last Xarelto was yesterday. I was going to resume it and plan for inpatient VCE tomorrow but given large pleural effusion, PCCM make want to do a thoracentesis.[name] VCE can wait, he isn't actively bleeding.     # Anasarca / moderate to large left pleural effusion. Rule out CHF.   # RCC, On Cabometyx for a long time. Has also been on other therapies at various times. Several days ago Oncology held [place] given anasarca, SHOB, anemia and GI bleed. Chest imaging suggests enlargement of left and right lung masses   # Pancreatic lesions, stable on today's CT scan. Metastatic lesions?   # History of B12 deficiency, taking B12 supplement.   # Emphysema  HPI:                                                                                                                             Chief Complaint:  Shortness of breath, anemia  Keith [name] is a 55 y.o. male with PMH / [place] significant for metastatic renal cancer status post nephrectomy and stereotactic radiotherapy to pulmonary nodule and radiation to several bony mets,  gastritis, recurrent GI bleeding, vitamin B12  deficiency, PE on Xarelto, tobacco abuse, emphysema  Keith [name] was hospitalized the end of December 2021 with recurrent GI bleeding on Xarelto.  He required 3 units of blood. He had a similar presentation in March 2021 but cause of bleed was never determined on upper / lower endoscopic evaluation and VCE. During his December admission he underwent a repeat EGD and colonoscopy but neither identified source of bleeding. He had APC to an area of abnormal mucosa in terminal ileum and also to a single non-bleeding angiodysplastic lesion in the colon . Keith [name] was discharged from the hospital on 12/21/2020, his hemoglobin was 8.6.  He has been taking oral iron, repeat CBC a couple of days ago at oncology's office showed  hemoglobin of 8.1.  Note his vitamin B12 in the hospital was low at 99, he is now on B12 supplements.   I saw him in the office on 2/3 with his wife for hospital follow up. He reported red blood in stool several times since December admission. He was also very edematous. Oncology had started him on a low dose of lasix and I recommended 2 gm sodium diet. He had an appt with PCP later this month. I scheduled him for repeat VCE on Xarelto ( for tomorrow) but he came to ED with increasing SOB. His hgb is down 2 grams. He has a large pleural effusion / anasarca. Since I saw him in the office he has had one episode of painless rectal bleeding with BM, say blood filled the toilet.    PREVIOUS ENDOSCOPIC EVALUATIONS / PERTINENT STUDIES   Dec 2021 EGD  -Normal esophagus - Mildly erythematous mucosa in the gastric fundus. Biopsies taken from the stomach to rule out H pylori - Normal stomach otherwise - no heme or ulceration - Normal duodenal bulb, second portion of the duodenum and third portion of the duodenum. No heme noted anywhere  Colonoscopy Dec 2021  Decreased sphincter tone found on digital rectal exam. - Altered vascular mucosa in the terminal ileum. Biopsied. Treated with argon  plasma coagulation (APC). - A single non-bleeding colonic angiodysplastic lesion. Treated with argon plasma coagulation (APC). Clip (MR conditional) was placed. - The examination was otherwise normal on direct and retroflexion views. NOTE THAT HE IS ON CABOMETYX AND ADVERSE REACTION OF THIS IS HEMORRHAGE, GI PERFORATION, ARTERIAL RUPTURE - NOT SURE OF CONTEXT OR MECHANISM BUT HE IS ON THIS MEDICATION THOUGH MUST REMEMBER IT IS LIFE-SAVING FOR HIM - I think a capsule endoscopy (repeat) might be reasonable but not now as the intestinal fluid was still dark. If I were to do a capsule would do off po iron and with a full prep and back on xarelto. this can wait til outpatient barring ongoing bleeding.  ILEUM, TERMINAL, BIOPSY:  - Small bowel mucosa with mild vascular ectasia.  - No active inflammation, chronic changes or granulomas.  - See comment   March 2021 2021 friable gastritis  March 2021 Colonoscopy Colonoscopy suggested dark blood throughout his colon without an obvious source. Capsule endoscopy showed poor visualization but there was no overt bleeding.     Past Medical History:  Diagnosis Date  . Diabetes mellitus without complication ([place])    diet controlled only  . GI bleed   . left renal ca d'd 2008  . 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 Metastasis to bone [facility]) 2014/2016    Past Surgical History:  Procedure Laterality Date  . BIOPSY  08/28/2019   Procedure: BIOPSY;  Surgeon: Thornton Park, MD;  Location: [facility];  Service: Gastroenterology;;  . BIOPSY  03/01/2020   Procedure: BIOPSY;  Surgeon: Mauri Pole, MD;  Location: [facility];  Service: Endoscopy;;  . BIOPSY  12/19/2020   Procedure: BIOPSY;  Surgeon: Yetta Flock, MD;  Location: [facility];  Service: Gastroenterology;;  . BIOPSY  12/20/2020   Procedure: BIOPSY;  Surgeon: Gatha Mayer, MD;  Location: [facility];  Service: Endoscopy;;   . COLONOSCOPY W/ POLYPECTOMY    . COLONOSCOPY WITH PROPOFOL N/A 03/01/2020   Procedure: COLONOSCOPY WITH PROPOFOL;  Surgeon: Mauri Pole, MD;  Location: [facility];  Service: Endoscopy;  Laterality: N/A;  . COLONOSCOPY WITH PROPOFOL N/A  12/20/2020   Procedure: COLONOSCOPY WITH PROPOFOL;  Surgeon: Gatha Mayer, MD;  [facility];  Service: Endoscopy;  Laterality: N/A;  . ESOPHAGOGASTRODUODENOSCOPY (EGD) WITH PROPOFOL N/A 08/28/2019   Procedure: ESOPHAGOGASTRODUODENOSCOPY (EGD) WITH PROPOFOL;  Surgeon: Thornton Park, MD;  [facility];  Service: Gastroenterology;  Laterality: N/A;  . ESOPHAGOGASTRODUODENOSCOPY (EGD) WITH PROPOFOL N/A 03/01/2020   Procedure: ESOPHAGOGASTRODUODENOSCOPY (EGD) WITH PROPOFOL;  Surgeon: Mauri Pole, MD;  [facility];  Service: Endoscopy;  Laterality: N/A;  . ESOPHAGOGASTRODUODENOSCOPY (EGD) WITH PROPOFOL N/A 12/19/2020   Procedure: ESOPHAGOGASTRODUODENOSCOPY (EGD) WITH PROPOFOL;  Surgeon: Yetta Flock, MD;  [facility];  Service: Gastroenterology;  Laterality: N/A;  . FEMUR IM NAIL Right 02/16/2018   Procedure: INTRAMEDULLARY (IM) NAIL RIGHT FEMORAL;  Surgeon: Paralee Cancel, MD;  Location: [facility];  Service: Orthopedics;  Laterality: Right;  . GIVENS CAPSULE STUDY N/A 03/03/2020   Procedure: GIVENS CAPSULE STUDY;  Surgeon: Mauri Pole, MD;  [facility];  Service: Endoscopy;  Laterality: N/A;  . HAND SURGERY Right   . HEMOSTASIS CLIP PLACEMENT  12/20/2020   Procedure: HEMOSTASIS CLIP PLACEMENT;  Surgeon: Gatha Mayer, MD;  [facility]: [facility];  Service: Endoscopy;;  . HOT HEMOSTASIS N/A 12/20/2020   Procedure: HOT HEMOSTASIS (ARGON PLASMA COAGULATION/BICAP);  Surgeon: Gatha Mayer, MD;  Location: [facility];  Service: Endoscopy;  Laterality: N/A;  . IR ANGIOGRAM EXTREMITY RIGHT  02/15/2018  . IR ANGIOGRAM SELECTIVE EACH ADDITIONAL VESSEL  02/15/2018  . IR ANGIOGRAM  SELECTIVE EACH ADDITIONAL VESSEL  02/15/2018  . IR ANGIOGRAM SELECTIVE EACH ADDITIONAL VESSEL  02/15/2018  . IR ANGIOGRAM SELECTIVE EACH ADDITIONAL VESSEL  02/15/2018  . IR EMBO TUMOR ORGAN ISCHEMIA INFARCT INC GUIDE ROADMAPPING  02/15/2018  . IR EMBO TUMOR ORGAN ISCHEMIA INFARCT INC GUIDE ROADMAPPING  02/15/2018  . IR US GUIDE VASC ACCESS LEFT  02/15/2018  . LUNG REMOVAL, PARTIAL Right 09/2008  . NEPHRECTOMY RADICAL     Left   . PAIN PUMP IMPLANTATION N/A 05/16/2015   Procedure: Intrathecal pain pump placement;  Surgeon: Clydell Hakim, MD;  Location: [facility];  Service: Neurosurgery;  Laterality: N/A;  Intrathecal pain pump placement    Prior to Admission medications   Medication Sig Start Date End Date Taking? Authorizing Provider  acetaminophen (TYLENOL) 325 MG tablet Take 650 mg by mouth every 6 (six) hours as needed.   Yes [provider]  ALPRAZolam (XANAX) 1 MG tablet TAKE 1 TABLET BY MOUTH THREE TIMES A DAY AS NEEDED FOR ANXIETY Patient taking differently: Take 1 mg by mouth 3 (three) times daily as needed for anxiety. 01/11/21  Yes [name], MD  amLODipine (NORVASC) 5 MG tablet Take 1 tablet (5 mg total) by mouth daily. 12/21/20 12/21/21 Yes Oswald Hillock, MD  diphenhydrAMINE (BENADRYL) 25 MG tablet Take 25 mg by mouth every 6 (six) hours as needed for allergies.   Yes [provider]  furosemide (LASIX) 20 MG tablet Take 1 tablet (20 mg total) by mouth daily. 01/22/21  Yes [name], MD  HYDROmorphone (DILAUDID) 8 MG tablet Take 1 tablet (8 mg total) by mouth every 4 (four) hours as needed for severe pain. Patient taking differently: Take 8 mg by mouth every 4 (four) hours as needed for moderate pain or severe pain. 02/17/18  Yes Babish, Rodman Key, PA-C  ondansetron (ZOFRAN) 4 MG tablet Take one tablet every morning.  Can take one tablet later in the day on an as needed basis Patient  taking differently: Take 4 mg by mouth daily. 10/10/20  Yes [name],  MD  PAIN MANAGEMENT INTRATHECAL, IT, PUMP 1 each by Intrathecal route continuous. Intrathecal (IT) medication:   Morphine.   Yes [provider]  pantoprazole (PROTONIX) 40 MG tablet Take 1 tablet (40 mg total) by mouth daily. 12/21/20  Yes Lama, Marge Duncans, MD  XARELTO 20 MG TABS tablet TAKE 1 TABLET (20 MG TOTAL) BY MOUTH DAILY WITH SUPPER. Patient taking differently: Take 20 mg by mouth daily. 05/14/20  Yes [name], MD  Cyanocobalamin (B-12) 1000 MCG TABS Take 1 tablet (1,000 mcg total) by mouth daily. Patient not taking: Reported on 01/31/2021 12/31/20   Willia Craze, NP  ferrous sulfate 325 (65 FE) MG tablet TAKE 1 TABLET BY MOUTH EVERY DAY WITH BREAKFAST Patient taking differently: Take 325 mg by mouth daily. 10/24/20   Zehr, Laban Emperor, PA-C  DULoxetine (CYMBALTA) 60 MG capsule TAKE 1 CAPSULE (60 MG TOTAL) BY MOUTH 2 (TWO) TIMES DAILY. Patient not taking: Reported on 12/18/2020 12/26/19 12/18/20  [name], MD    Current Facility-Administered Medications  Medication Dose Route Frequency Provider Last Rate Last Admin  . 0.9 %  sodium chloride infusion (Manually program via Guardrails IV Fluids)   Intravenous Once Harold Hedge, MD      . acetaminophen (TYLENOL) tablet 650 mg  650 mg Oral Q6H PRN Harold Hedge, MD      . ALPRAZolam Duanne Moron) tablet 1 mg  1 mg Oral TID PRN Harold Hedge, MD      . furosemide (LASIX) injection 20 mg  20 mg Intravenous Once Harold Hedge, MD      . furosemide (LASIX) injection 40 mg  40 mg Intravenous BID Harold Hedge, MD      . HYDROmorphone (DILAUDID) tablet 8 mg  8 mg Oral Q4H PRN Harold Hedge, MD      . metoprolol tartrate (LOPRESSOR) injection 5 mg  5 mg Intravenous Q6H PRN Harold Hedge, MD      . ondansetron [facility]) tablet 4 mg  4 mg Oral Q6H PRN Harold Hedge, MD       Or  . ondansetron [facility]) injection 4 mg  4 mg Intravenous Q6H PRN Harold Hedge, MD      . PAIN MANAGEMENT INTRATHECAL (IT) PUMP 1 each  1 each  Intrathecal Continuous Harold Hedge, MD      . pantoprazole (PROTONIX) EC tablet 40 mg  40 mg Oral Daily Marva Panda E, MD      . sodium chloride flush (NS) 0.9 % injection 3 mL  3 mL Intravenous Q12H Harold Hedge, MD       Current Outpatient Medications  Medication Sig Dispense Refill  . acetaminophen (TYLENOL) 325 MG tablet Take 650 mg by mouth every 6 (six) hours as needed.    . ALPRAZolam (XANAX) 1 MG tablet TAKE 1 TABLET BY MOUTH THREE TIMES A DAY AS NEEDED FOR ANXIETY (Patient taking differently: Take 1 mg by mouth 3 (three) times daily as needed for anxiety.) 90 tablet 0  . amLODipine (NORVASC) 5 MG tablet Take 1 tablet (5 mg total) by mouth daily. 30 tablet 11  . diphenhydrAMINE (BENADRYL) 25 MG tablet Take 25 mg by mouth every 6 (six) hours as needed for allergies.    . furosemide (LASIX) 20 MG tablet Take 1 tablet (20 mg total) by mouth daily. 30 tablet 1  . HYDROmorphone (DILAUDID)  8 MG tablet Take 1 tablet (8 mg total) by mouth every 4 (four) hours as needed for severe pain. (Patient taking differently: Take 8 mg by mouth every 4 (four) hours as needed for moderate pain or severe pain.) 30 tablet 0  . ondansetron (ZOFRAN) 4 MG tablet Take one tablet every morning.  Can take one tablet later in the day on an as needed basis (Patient taking differently: Take 4 mg by mouth daily.) 50 tablet 11  . PAIN MANAGEMENT INTRATHECAL, IT, PUMP 1 each by Intrathecal route continuous. Intrathecal (IT) medication:   Morphine.    . pantoprazole (PROTONIX) 40 MG tablet Take 1 tablet (40 mg total) by mouth daily. 30 tablet 3  . XARELTO 20 MG TABS tablet TAKE 1 TABLET (20 MG TOTAL) BY MOUTH DAILY WITH SUPPER. (Patient taking differently: Take 20 mg by mouth daily.) 90 tablet 3  . Cyanocobalamin (B-12) 1000 MCG TABS Take 1 tablet (1,000 mcg total) by mouth daily. (Patient not taking: Reported on 01/31/2021) 30 tablet   . ferrous sulfate 325 (65 FE) MG tablet TAKE 1 TABLET BY MOUTH EVERY DAY WITH  BREAKFAST (Patient taking differently: Take 325 mg by mouth daily.) 90 tablet 2    Allergies as of 01/31/2021 - Review Complete 01/31/2021  Allergen Reaction Noted  . Ceftriaxone Hives 08/06/2011  . Hydrocodone Swelling 08/06/2011    Family History  Problem Relation Age of Onset  . Hypertension Mother   . Diabetes Brother   . Irritable bowel syndrome Sister   . Cancer Maternal Aunt        ovarian  . Colon cancer Neg Hx     Social History   Socioeconomic History  . Marital status: Married    Spouse name: Not on file  . Number of children: 2  . Years of education: Not on file  . Highest education level: Not on file  Occupational History  . Occupation: Clinical biochemist   Tobacco Use  . Smoking status: Current Every Day Smoker    Packs/day: 0.50    Years: 30.00    Pack years: 15.00    Types: Cigarettes  . Smokeless tobacco: Never Used  Vaping Use  . Vaping Use: Never used  Substance and Sexual Activity  . Alcohol use: No  . Drug use: Yes    Types: Marijuana    Comment: occasional. Last used: last night.   . Sexual activity: Not Currently  Other Topics Concern  . Not on file  Social History Narrative   6 caffeine drinks daily    08-11-18  Unable to ask abuse questions wife with him today.   Social Determinants of Health   Financial Resource Strain: Not on file  Food Insecurity: Not on file  Transportation Needs: Not on file  Physical Activity: Not on file  Stress: Not on file  Social Connections: Not on file  Intimate Partner Violence: Not on file    Review of Systems: All systems reviewed and negative except where noted in HPI.    OBJECTIVE:    Physical Exam: Vital signs in last 24 hours: Temp:  [97.4 F (36.3 C)-97.7 F (36.5 C)] 97.6 F (36.4 C) (02/10 0957) Pulse Rate:  [82-104] 90 (02/10 0957) Resp:  [12-21] 16 (02/10 0957) BP: (148-165)/(94-97) 148/94 (02/10 0957) SpO2:  [93 %-100 %] 93 % (02/10 0930)   General:   Alert frail appearing white  male in NAD Psych:  Pleasant, cooperative. Normal mood and affect. Eyes:  Pupils equal, sclera clear, no icterus.  Conjunctiva pale. Ears:  Normal auditory acuity. Nose:  No deformity, discharge,  or lesions. Neck:  Supple; no masses Lungs:  Decreased breath sound in RLL.  Heart:  Regular rate and rhythm, BLE pitting edema Abdomen:  Soft, non-distended, nontender, BS active, no palp mass   Rectal:  Deferred  Msk:  Symmetrical without gross deformities. . Neurologic:  Alert and  oriented x4;  grossly normal neurologically. Skin:  Intact without significant lesions or rashes.  There were no vitals filed for this visit.   Scheduled inpatient medications . sodium chloride   Intravenous Once  . furosemide  20 mg Intravenous Once  . furosemide  40 mg Intravenous BID  . pantoprazole  40 mg Oral Daily  . sodium chloride flush  3 mL Intravenous Q12H      Intake/Output from previous day: No intake/output data recorded. Intake/Output this shift: Total I/O In: 815 [I.V.:500; Blood:315] Out: -    Lab Results: Recent Labs    01/31/21 0635  WBC 6.8  HGB 6.1*  HCT 21.6*  PLT 178   BMET Recent Labs    01/31/21 0606  NA 139  K 5.0  CL 104  CO2 26  GLUCOSE 125*  BUN 34*  CREATININE 1.80*  CALCIUM 8.2*   LFT Recent Labs    01/31/21 0606  PROT 5.5*  ALBUMIN 2.2*  AST 14*  ALT 13  ALKPHOS 63  BILITOT 0.6   PT/INR No results for input(s): LABPROT, INR in the last 72 hours. Hepatitis Panel No results for input(s): HEPBSAG, HCVAB, HEPAIGM, HEPBIGM in the last 72 hours.   . CBC Latest Ref Rng & Units 01/31/2021 01/22/2021 01/01/2021  WBC 4.0 - 10.5 K/uL 6.8 6.7 7.3  Hemoglobin 13.0 - 17.0 g/dL 6.1(LL) 8.1(L) 8.9(L)  Hematocrit 39.0 - 52.0 % 21.6(L) 28.2(L) 27.6(L)  Platelets 150 - 400 K/uL 178 118(L) 168.0    . CMP Latest Ref Rng & Units 01/31/2021 01/22/2021 12/20/2020  Glucose 70 - 99 mg/dL 125(H) 108(H) 85  BUN 6 - 20 mg/dL 34(H) 22(H) 22(H)  Creatinine 0.61 -  1.24 mg/dL 1.80(H) 1.35(H) 1.23  Sodium 135 - 145 mmol/L 139 139 139  Potassium 3.5 - 5.1 mmol/L 5.0 4.6 4.1  Chloride 98 - 111 mmol/L 104 111 108  CO2 22 - 32 mmol/L 26 25 25   Calcium 8.9 - 10.3 mg/dL 8.2(L) 7.8(L) 7.5(L)  Total Protein 6.5 - 8.1 g/dL 5.5(L) 5.4(L) -  Total Bilirubin 0.3 - 1.2 mg/dL 0.6 0.3 -  Alkaline Phos 38 - 126 U/L 63 72 -  AST 15 - 41 U/L 14(L) 17 -  ALT 0 - 44 U/L 13 12 -   Studies/Results: CT ABDOMEN PELVIS W WO CONTRAST  Result Date: 01/31/2021 CLINICAL DATA:  Restaging metastatic left renal cancer, prior nephrectomy. Discontinuation of oral chemotherapy. Shortness of breath. EXAM: CT CHEST WITH CONTRAST CT ABDOMEN AND PELVIS WITH AND WITHOUT CONTRAST TECHNIQUE: Multidetector CT imaging of the chest was performed during intravenous contrast administration. Multidetector CT imaging of the abdomen and pelvis was performed following the standard protocol before and during bolus administration of intravenous contrast. CONTRAST:  78m OMNIPAQUE IOHEXOL 300 MG/ML  SOLN COMPARISON:  11/20/2020 FINDINGS: CT CHEST FINDINGS Cardiovascular: No significant thoracic atherosclerosis. Please note that today's exam was not performed as a CT angiogram and is not considered highly sensitive for pulmonary embolus. Stable small pericardial effusion. Mediastinum/Nodes: 1.3 by 0.9 cm hypodense nodule anteriorly in the right thyroid lobe on image 6 of series 12. Not clinically  significant; no follow-up imaging recommended (ref: J Am Coll Radiol. 2015 Feb;12(2): 143-50). There is background stranding in the mediastinum which matches the background stranding in the subcutaneous tissues and likely reflects third spacing of fluid. Lungs/Pleura: Predominantly left lower lobe perihilar mass measures 4.3 by 3.4 cm on image 50 of series 12, previously 3.1 by 3.2 cm by my measurements. Right middle lobe and right upper lobe peribronchovascular mass measures 2.4 cm along its short axis, previously 2.1 cm.  Stable thick bandlike scarring with volume loss in the right upper lobe. Underlying emphysema. Scattered scarring in the lungs. There is some hazy ground-glass opacity in the lungs which is increased from prior and could be a subtle manifestation of edema or third spacing of fluid. Moderate to large left pleural effusion has increased from prior. Small right pleural effusion appear stable. Musculoskeletal: Thoracic spondylosis. Stable small anterior sclerotic lesion in the T2 vertebral body. Anomalous medial left second rib. Observe 0 catheter noted terminating at about the T4 level posteriorly. CT ABDOMEN AND PELVIS FINDINGS Hepatobiliary: No well delineated hepatic lesions are observed. Gallbladder unremarkable. Pancreas: Dorsal pancreatic duct dilatation extending to a pancreatic body mass with enhancing portion of this mass measuring 3.6 by 2.0 cm on image 41 of series 7, previously 3.6 by 1.9 cm. Along the anterior margin of the pancreatic head, a 3.1 by 2.3 cm mass is present and previously measured 2.8 by 2.4 cm. The pancreatic lesions are roughly stable. Questionable third small pancreatic nodule along the pancreatic head on image 54 of series 7 measuring 0.9 cm in diameter, stable. Possible fourth pancreatic nodule along the anterior portion of the pancreatic tail measuring 1.0 cm in diameter, stable. Spleen: Unremarkable Adrenals/Urinary Tract: Left nephrectomy. Adrenal glands unremarkable. The right kidney appears unremarkable. No urinary tract calculi are observed. Urinary bladder unremarkable. Stomach/Bowel: No dilated bowel. Similar appearance of mild diffuse rectal wall thickening compared to previous, possibly incidental. Vascular/Lymphatic: Aortoiliac atherosclerotic vascular disease. No pathologic adenopathy. Reproductive: Unremarkable Other: Diffuse subcutaneous and mesenteric edema suggesting third spacing of fluid. Trace free pelvic fluid, improved from prior. Diffuse stranding in the perirectal  space. Musculoskeletal: Left lower abdominal wall infusion pump. Necks chloride akin lucent lesion of the right sacral ala probably representing a metastatic lesion and not appreciably changed from prior. Mild sclerosis along the SI joints. Right proximal femur ORIF, with a lucency in the right proximal femur shown on image 76 of series 13 and also noted on prior exam. Heterotopic ossification above the greater trochanter. Sharply defined lytic lesion of the right inferior pubic ramus measuring 2.4 by 1.3 cm on image 217 of series 12 with sclerotic margination, previously 2.2 by 1.3 cm. IMPRESSION: 1. Mild enlargement of the left lower lobe perihilar mass, and of the right middle lobe/right upper lobe peribronchovascular mass. Increased left pleural effusion, now moderate to large. Stable small right pleural effusion. Stable small pericardial effusion. 2. Diffuse subcutaneous, mesenteric, and mediastinal edema compatible with third spacing of fluid. There is some hazy density in the lungs which also may be due to noncardiogenic edema. 3. Stable appearance of multiple pancreatic masses. 4. Stable osseous manifestations of metastatic disease, with the largest lesion in the right sacrum. 5. Emphysema and aortic atherosclerosis. Aortic Atherosclerosis (ICD10-I70.0) and Emphysema (ICD10-J43.9). Electronically Signed   By: Van Clines M.D.   On: 01/31/2021 10:56   DG Chest 2 View  Result Date: 01/31/2021 CLINICAL DATA:  Shortness of breath.  Metastatic renal cell cancer. EXAM: CHEST - 2 VIEW COMPARISON:  06/30/2020 FINDINGS: Interval progression of spiculated nodular opacity in the suprahilar right lung with progressive right infrahilar opacity in this patient with known metastatic renal cell carcinoma. New left base collapse/consolidation with small left pleural effusion. Tiny right pleural effusion is similar to prior. The cardio pericardial silhouette is enlarged. The visualized bony structures of the thorax  show no acute abnormality. IMPRESSION: 1. Interval progression of right suprahilar and right infrahilar pulmonary metastatic disease. 2. New left base collapse/consolidation with small left pleural effusion. Findings may be related to metastatic involvement or pneumonia. Electronically Signed   By: Misty Stanley M.D.   On: 01/31/2021 06:24   CT CHEST W CONTRAST  Result Date: 01/31/2021 CLINICAL DATA:  Restaging metastatic left renal cancer, prior nephrectomy. Discontinuation of oral chemotherapy. Shortness of breath. EXAM: CT CHEST WITH CONTRAST CT ABDOMEN AND PELVIS WITH AND WITHOUT CONTRAST TECHNIQUE: Multidetector CT imaging of the chest was performed during intravenous contrast administration. Multidetector CT imaging of the abdomen and pelvis was performed following the standard protocol before and during bolus administration of intravenous contrast. CONTRAST:  60m OMNIPAQUE IOHEXOL 300 MG/ML  SOLN COMPARISON:  11/20/2020 FINDINGS: CT CHEST FINDINGS Cardiovascular: No significant thoracic atherosclerosis. Please note that today's exam was not performed as a CT angiogram and is not considered highly sensitive for pulmonary embolus. Stable small pericardial effusion. Mediastinum/Nodes: 1.3 by 0.9 cm hypodense nodule anteriorly in the right thyroid lobe on image 6 of series 12. Not clinically significant; no follow-up imaging recommended (ref: J Am Coll Radiol. 2015 Feb;12(2): 143-50). There is background stranding in the mediastinum which matches the background stranding in the subcutaneous tissues and likely reflects third spacing of fluid. Lungs/Pleura: Predominantly left lower lobe perihilar mass measures 4.3 by 3.4 cm on image 50 of series 12, previously 3.1 by 3.2 cm by my measurements. Right middle lobe and right upper lobe peribronchovascular mass measures 2.4 cm along its short axis, previously 2.1 cm. Stable thick bandlike scarring with volume loss in the right upper lobe. Underlying emphysema.  Scattered scarring in the lungs. There is some hazy ground-glass opacity in the lungs which is increased from prior and could be a subtle manifestation of edema or third spacing of fluid. Moderate to large left pleural effusion has increased from prior. Small right pleural effusion appear stable. Musculoskeletal: Thoracic spondylosis. Stable small anterior sclerotic lesion in the T2 vertebral body. Anomalous medial left second rib. Observe 0 catheter noted terminating at about the T4 level posteriorly. CT ABDOMEN AND PELVIS FINDINGS Hepatobiliary: No well delineated hepatic lesions are observed. Gallbladder unremarkable. Pancreas: Dorsal pancreatic duct dilatation extending to a pancreatic body mass with enhancing portion of this mass measuring 3.6 by 2.0 cm on image 41 of series 7, previously 3.6 by 1.9 cm. Along the anterior margin of the pancreatic head, a 3.1 by 2.3 cm mass is present and previously measured 2.8 by 2.4 cm. The pancreatic lesions are roughly stable. Questionable third small pancreatic nodule along the pancreatic head on image 54 of series 7 measuring 0.9 cm in diameter, stable. Possible fourth pancreatic nodule along the anterior portion of the pancreatic tail measuring 1.0 cm in diameter, stable. Spleen: Unremarkable Adrenals/Urinary Tract: Left nephrectomy. Adrenal glands unremarkable. The right kidney appears unremarkable. No urinary tract calculi are observed. Urinary bladder unremarkable. Stomach/Bowel: No dilated bowel. Similar appearance of mild diffuse rectal wall thickening compared to previous, possibly incidental. Vascular/Lymphatic: Aortoiliac atherosclerotic vascular disease. No pathologic adenopathy. Reproductive: Unremarkable Other: Diffuse subcutaneous and mesenteric edema suggesting third spacing of fluid.  Trace free pelvic fluid, improved from prior. Diffuse stranding in the perirectal space. Musculoskeletal: Left lower abdominal wall infusion pump. Necks chloride akin lucent  lesion of the right sacral ala probably representing a metastatic lesion and not appreciably changed from prior. Mild sclerosis along the SI joints. Right proximal femur ORIF, with a lucency in the right proximal femur shown on image 76 of series 13 and also noted on prior exam. Heterotopic ossification above the greater trochanter. Sharply defined lytic lesion of the right inferior pubic ramus measuring 2.4 by 1.3 cm on image 217 of series 12 with sclerotic margination, previously 2.2 by 1.3 cm. IMPRESSION: 1. Mild enlargement of the left lower lobe perihilar mass, and of the right middle lobe/right upper lobe peribronchovascular mass. Increased left pleural effusion, now moderate to large. Stable small right pleural effusion. Stable small pericardial effusion. 2. Diffuse subcutaneous, mesenteric, and mediastinal edema compatible with third spacing of fluid. There is some hazy density in the lungs which also may be due to noncardiogenic edema. 3. Stable appearance of multiple pancreatic masses. 4. Stable osseous manifestations of metastatic disease, with the largest lesion in the right sacrum. 5. Emphysema and aortic atherosclerosis. Aortic Atherosclerosis (ICD10-I70.0) and Emphysema (ICD10-J43.9). Electronically Signed   By: Van Clines M.D.   On: 01/31/2021 10:56    Tye Savoy, NP-C @  01/31/2021, 11:30 AM  ________________________________________________________________________  Vel[facility] Heckler GI MD note:  I personally examined the patient, reviewed the data and agree with the assessment and plan described above.  He is intermittently having GI bleeding, unclear etiology and we were planning capsule endoscopy for him while still on his blood thinner xarelto.  Unfortunately he is admitted now ith worsening SOB, likely from moderate to large left sided pleural effusion that is significantly worse than 2-3 months ago in addition to acute on chronic anemia.  He is not having overt GI bleeding.  CT chest  abd pelvis shows that  His widely metastatic renal cancer is progressing.  Priority currently is improving his breathing and so his xarelto will be held so that he can get a thorcentesis.  He will probably not be restarted on his xarelto, likely ever and as long as he does not have any further overt GI bleeding then there will be no need for capsule endoscopy testing.    We will follow along.   Owens Loffler, MD [facility] Pager [phone]

## 2021-02-01 ENCOUNTER — Inpatient Hospital Stay (HOSPITAL_COMMUNITY): Payer: Medicare Other

## 2021-02-01 DIAGNOSIS — R0602 Shortness of breath: Secondary | ICD-10-CM | POA: Diagnosis not present

## 2021-02-01 DIAGNOSIS — C7801 Secondary malignant neoplasm of right lung: Secondary | ICD-10-CM

## 2021-02-01 DIAGNOSIS — J90 Pleural effusion, not elsewhere classified: Secondary | ICD-10-CM

## 2021-02-01 DIAGNOSIS — C7802 Secondary malignant neoplasm of left lung: Secondary | ICD-10-CM

## 2021-02-01 DIAGNOSIS — R0600 Dyspnea, unspecified: Secondary | ICD-10-CM

## 2021-02-01 LAB — CBC WITH DIFFERENTIAL/PLATELET
Abs Immature Granulocytes: 0.02 10*3/uL (ref 0.00–0.07)
Basophils Absolute: 0.1 10*3/uL (ref 0.0–0.1)
Basophils Relative: 1 %
Eosinophils Absolute: 0.3 10*3/uL (ref 0.0–0.5)
Eosinophils Relative: 6 %
HCT: 26.5 % — ABNORMAL LOW (ref 39.0–52.0)
Hemoglobin: 7.8 g/dL — ABNORMAL LOW (ref 13.0–17.0)
Immature Granulocytes: 0 %
Lymphocytes Relative: 15 %
Lymphs Abs: 0.8 10*3/uL (ref 0.7–4.0)
MCH: 32.1 pg (ref 26.0–34.0)
MCHC: 29.4 g/dL — ABNORMAL LOW (ref 30.0–36.0)
MCV: 109.1 fL — ABNORMAL HIGH (ref 80.0–100.0)
Monocytes Absolute: 0.7 10*3/uL (ref 0.1–1.0)
Monocytes Relative: 14 %
Neutro Abs: 3.5 10*3/uL (ref 1.7–7.7)
Neutrophils Relative %: 64 %
Platelets: 160 10*3/uL (ref 150–400)
RBC: 2.43 MIL/uL — ABNORMAL LOW (ref 4.22–5.81)
RDW: 22.3 % — ABNORMAL HIGH (ref 11.5–15.5)
WBC: 5.4 10*3/uL (ref 4.0–10.5)
nRBC: 0.4 % — ABNORMAL HIGH (ref 0.0–0.2)

## 2021-02-01 LAB — COMPREHENSIVE METABOLIC PANEL
ALT: 13 U/L (ref 0–44)
AST: 16 U/L (ref 15–41)
Albumin: 2.4 g/dL — ABNORMAL LOW (ref 3.5–5.0)
Alkaline Phosphatase: 66 U/L (ref 38–126)
Anion gap: 9 (ref 5–15)
BUN: 32 mg/dL — ABNORMAL HIGH (ref 6–20)
CO2: 27 mmol/L (ref 22–32)
Calcium: 8.4 mg/dL — ABNORMAL LOW (ref 8.9–10.3)
Chloride: 104 mmol/L (ref 98–111)
Creatinine, Ser: 1.63 mg/dL — ABNORMAL HIGH (ref 0.61–1.24)
GFR, Estimated: 50 mL/min — ABNORMAL LOW (ref >60)
Glucose, Bld: 111 mg/dL — ABNORMAL HIGH (ref 70–99)
Potassium: 4.3 mmol/L (ref 3.5–5.1)
Sodium: 140 mmol/L (ref 135–145)
Total Bilirubin: 0.6 mg/dL (ref 0.3–1.2)
Total Protein: 6.2 g/dL — ABNORMAL LOW (ref 6.5–8.1)

## 2021-02-01 LAB — BODY FLUID CELL COUNT WITH DIFFERENTIAL
Eos, Fluid: 0 %
Lymphs, Fluid: 3 %
Monocyte-Macrophage-Serous Fluid: 38 % — ABNORMAL LOW (ref 50–90)
Neutrophil Count, Fluid: 59 % — ABNORMAL HIGH (ref 0–25)
Total Nucleated Cell Count, Fluid: 267 cu mm (ref 0–1000)

## 2021-02-01 LAB — ALBUMIN, PLEURAL OR PERITONEAL FLUID: Albumin, Fluid: <1 g/dL

## 2021-02-01 LAB — GLUCOSE, PLEURAL OR PERITONEAL FLUID: Glucose, Fluid: 115 mg/dL

## 2021-02-01 LAB — LACTATE DEHYDROGENASE, PLEURAL OR PERITONEAL FLUID: LD, Fluid: 78 U/L — ABNORMAL HIGH (ref 3–23)

## 2021-02-01 LAB — ECHOCARDIOGRAM COMPLETE
Area-P 1/2: 3.08 cm2
S' Lateral: 4.44 cm

## 2021-02-01 NOTE — Plan of Care (Signed)
inititiated

## 2021-02-01 NOTE — Progress Notes (Signed)
IP PROGRESS NOTE  Subjective:   Keith [name] reports a feeling okay this morning.  He has no specific complaints.  He denies any nausea or abdominal pain.  His lower extremity edema has improved.  He denies any dyspnea or chest pain.  He denies any diarrhea or abdominal distention.  Objective:  Vital signs in last 24 hours: Temp:  [97.3 F (36.3 C)-97.6 F (36.4 C)] 97.6 F (36.4 C) (02/11 0615) Pulse Rate:  [90-123] 105 (02/11 0930) Resp:  [12-24] 18 (02/11 0930) BP: (121-154)/(89-100) 121/89 (02/11 0930) SpO2:  [92 %-100 %] 92 % (02/11 0930) Weight change:     Intake/Output from previous day: 02/10 0701 - 02/11 0700 In: 1150 [I.V.:520; Blood:630] Out: -  General: Alert, awake without distress. Head: Normocephalic atraumatic. Mouth: mucous membranes moist, pharynx normal without lesions Eyes: No scleral icterus.  Pupils are equal and round reactive to light. Resp: clear to auscultation bilaterally without rhonchi or wheezes or dullness to percussion. Cardio: regular rate and rhythm, S1, S2 normal, bilateral edema noted GI: soft, non-tender; bowel sounds normal; no masses,  no organomegaly Musculoskeletal: No joint deformity or effusion. Neurological: No motor, sensory deficits.  Intact deep tendon reflexes. Skin: No rashes or lesions.    Lab Results: Recent Labs    01/31/21 0635 01/31/21 1745 02/01/21 0459  WBC 6.8  --  5.4  HGB 6.1* 7.4* 7.8*  HCT 21.6* 27.1* 26.5*  PLT 178  --  160    BMET Recent Labs    01/31/21 0606 02/01/21 0459  NA 139 140  K 5.0 4.3  CL 104 104  CO2 26 27  GLUCOSE 125* 111*  BUN 34* 32*  CREATININE 1.80* 1.63*  CALCIUM 8.2* 8.4*    Studies/Results: CT ABDOMEN PELVIS W WO CONTRAST  Result Date: 01/31/2021 CLINICAL DATA:  Restaging metastatic left renal cancer, prior nephrectomy. Discontinuation of oral chemotherapy. Shortness of breath. EXAM: CT CHEST WITH CONTRAST CT ABDOMEN AND PELVIS WITH AND WITHOUT CONTRAST TECHNIQUE:  Multidetector CT imaging of the chest was performed during intravenous contrast administration. Multidetector CT imaging of the abdomen and pelvis was performed following the standard protocol before and during bolus administration of intravenous contrast. CONTRAST:  8mL OMNIPAQUE IOHEXOL 300 MG/ML  SOLN COMPARISON:  11/20/2020 FINDINGS: CT CHEST FINDINGS Cardiovascular: No significant thoracic atherosclerosis. Please note that today's exam was not performed as a CT angiogram and is not considered highly sensitive for pulmonary embolus. Stable small pericardial effusion. Mediastinum/Nodes: 1.3 by 0.9 cm hypodense nodule anteriorly in the right thyroid lobe on image 6 of series 12. Not clinically significant; no follow-up imaging recommended (ref: J Am Coll Radiol. 2015 Feb;12(2): 143-50). There is background stranding in the mediastinum which matches the background stranding in the subcutaneous tissues and likely reflects third spacing of fluid. Lungs/Pleura: Predominantly left lower lobe perihilar mass measures 4.3 by 3.4 cm on image 50 of series 12, previously 3.1 by 3.2 cm by my measurements. Right middle lobe and right upper lobe peribronchovascular mass measures 2.4 cm along its short axis, previously 2.1 cm. Stable thick bandlike scarring with volume loss in the right upper lobe. Underlying emphysema. Scattered scarring in the lungs. There is some hazy ground-glass opacity in the lungs which is increased from prior and could be a subtle manifestation of edema or third spacing of fluid. Moderate to large left pleural effusion has increased from prior. Small right pleural effusion appear stable. Musculoskeletal: Thoracic spondylosis. Stable small anterior sclerotic lesion in the T2 vertebral body. Anomalous medial  left second rib. Observe 0 catheter noted terminating at about the T4 level posteriorly. CT ABDOMEN AND PELVIS FINDINGS Hepatobiliary: No well delineated hepatic lesions are observed. Gallbladder  unremarkable. Pancreas: Dorsal pancreatic duct dilatation extending to a pancreatic body mass with enhancing portion of this mass measuring 3.6 by 2.0 cm on image 41 of series 7, previously 3.6 by 1.9 cm. Along the anterior margin of the pancreatic head, a 3.1 by 2.3 cm mass is present and previously measured 2.8 by 2.4 cm. The pancreatic lesions are roughly stable. Questionable third small pancreatic nodule along the pancreatic head on image 54 of series 7 measuring 0.9 cm in diameter, stable. Possible fourth pancreatic nodule along the anterior portion of the pancreatic tail measuring 1.0 cm in diameter, stable. Spleen: Unremarkable Adrenals/Urinary Tract: Left nephrectomy. Adrenal glands unremarkable. The right kidney appears unremarkable. No urinary tract calculi are observed. Urinary bladder unremarkable. Stomach/Bowel: No dilated bowel. Similar appearance of mild diffuse rectal wall thickening compared to previous, possibly incidental. Vascular/Lymphatic: Aortoiliac atherosclerotic vascular disease. No pathologic adenopathy. Reproductive: Unremarkable Other: Diffuse subcutaneous and mesenteric edema suggesting third spacing of fluid. Trace free pelvic fluid, improved from prior. Diffuse stranding in the perirectal space. Musculoskeletal: Left lower abdominal wall infusion pump. Necks chloride akin lucent lesion of the right sacral ala probably representing a metastatic lesion and not appreciably changed from prior. Mild sclerosis along the SI joints. Right proximal femur ORIF, with a lucency in the right proximal femur shown on image 76 of series 13 and also noted on prior exam. Heterotopic ossification above the greater trochanter. Sharply defined lytic lesion of the right inferior pubic ramus measuring 2.4 by 1.3 cm on image 217 of series 12 with sclerotic margination, previously 2.2 by 1.3 cm. IMPRESSION: 1. Mild enlargement of the left lower lobe perihilar mass, and of the right middle lobe/right upper  lobe peribronchovascular mass. Increased left pleural effusion, now moderate to large. Stable small right pleural effusion. Stable small pericardial effusion. 2. Diffuse subcutaneous, mesenteric, and mediastinal edema compatible with third spacing of fluid. There is some hazy density in the lungs which also may be due to noncardiogenic edema. 3. Stable appearance of multiple pancreatic masses. 4. Stable osseous manifestations of metastatic disease, with the largest lesion in the right sacrum. 5. Emphysema and aortic atherosclerosis. Aortic Atherosclerosis (ICD10-I70.0) and Emphysema (ICD10-J43.9). Electronically Signed   By: Van Clines M.D.   On: 01/31/2021 10:56   DG Chest 2 View  Result Date: 01/31/2021 CLINICAL DATA:  Shortness of breath.  Metastatic renal cell cancer. EXAM: CHEST - 2 VIEW COMPARISON:  06/30/2020 FINDINGS: Interval progression of spiculated nodular opacity in the suprahilar right lung with progressive right infrahilar opacity in this patient with known metastatic renal cell carcinoma. New left base collapse/consolidation with small left pleural effusion. Tiny right pleural effusion is similar to prior. The cardio pericardial silhouette is enlarged. The visualized bony structures of the thorax show no acute abnormality. IMPRESSION: 1. Interval progression of right suprahilar and right infrahilar pulmonary metastatic disease. 2. New left base collapse/consolidation with small left pleural effusion. Findings may be related to metastatic involvement or pneumonia. Electronically Signed   By: Misty Stanley M.D.   On: 01/31/2021 06:24   CT CHEST W CONTRAST  Result Date: 01/31/2021 CLINICAL DATA:  Restaging metastatic left renal cancer, prior nephrectomy. Discontinuation of oral chemotherapy. Shortness of breath. EXAM: CT CHEST WITH CONTRAST CT ABDOMEN AND PELVIS WITH AND WITHOUT CONTRAST TECHNIQUE: Multidetector CT imaging of the chest was performed during intravenous  contrast  administration. Multidetector CT imaging of the abdomen and pelvis was performed following the standard protocol before and during bolus administration of intravenous contrast. CONTRAST:  35mL OMNIPAQUE IOHEXOL 300 MG/ML  SOLN COMPARISON:  11/20/2020 FINDINGS: CT CHEST FINDINGS Cardiovascular: No significant thoracic atherosclerosis. Please note that today's exam was not performed as a CT angiogram and is not considered highly sensitive for pulmonary embolus. Stable small pericardial effusion. Mediastinum/Nodes: 1.3 by 0.9 cm hypodense nodule anteriorly in the right thyroid lobe on image 6 of series 12. Not clinically significant; no follow-up imaging recommended (ref: J Am Coll Radiol. 2015 Feb;12(2): 143-50). There is background stranding in the mediastinum which matches the background stranding in the subcutaneous tissues and likely reflects third spacing of fluid. Lungs/Pleura: Predominantly left lower lobe perihilar mass measures 4.3 by 3.4 cm on image 50 of series 12, previously 3.1 by 3.2 cm by my measurements. Right middle lobe and right upper lobe peribronchovascular mass measures 2.4 cm along its short axis, previously 2.1 cm. Stable thick bandlike scarring with volume loss in the right upper lobe. Underlying emphysema. Scattered scarring in the lungs. There is some hazy ground-glass opacity in the lungs which is increased from prior and could be a subtle manifestation of edema or third spacing of fluid. Moderate to large left pleural effusion has increased from prior. Small right pleural effusion appear stable. Musculoskeletal: Thoracic spondylosis. Stable small anterior sclerotic lesion in the T2 vertebral body. Anomalous medial left second rib. Observe 0 catheter noted terminating at about the T4 level posteriorly. CT ABDOMEN AND PELVIS FINDINGS Hepatobiliary: No well delineated hepatic lesions are observed. Gallbladder unremarkable. Pancreas: Dorsal pancreatic duct dilatation extending to a pancreatic  body mass with enhancing portion of this mass measuring 3.6 by 2.0 cm on image 41 of series 7, previously 3.6 by 1.9 cm. Along the anterior margin of the pancreatic head, a 3.1 by 2.3 cm mass is present and previously measured 2.8 by 2.4 cm. The pancreatic lesions are roughly stable. Questionable third small pancreatic nodule along the pancreatic head on image 54 of series 7 measuring 0.9 cm in diameter, stable. Possible fourth pancreatic nodule along the anterior portion of the pancreatic tail measuring 1.0 cm in diameter, stable. Spleen: Unremarkable Adrenals/Urinary Tract: Left nephrectomy. Adrenal glands unremarkable. The right kidney appears unremarkable. No urinary tract calculi are observed. Urinary bladder unremarkable. Stomach/Bowel: No dilated bowel. Similar appearance of mild diffuse rectal wall thickening compared to previous, possibly incidental. Vascular/Lymphatic: Aortoiliac atherosclerotic vascular disease. No pathologic adenopathy. Reproductive: Unremarkable Other: Diffuse subcutaneous and mesenteric edema suggesting third spacing of fluid. Trace free pelvic fluid, improved from prior. Diffuse stranding in the perirectal space. Musculoskeletal: Left lower abdominal wall infusion pump. Necks chloride akin lucent lesion of the right sacral ala probably representing a metastatic lesion and not appreciably changed from prior. Mild sclerosis along the SI joints. Right proximal femur ORIF, with a lucency in the right proximal femur shown on image 76 of series 13 and also noted on prior exam. Heterotopic ossification above the greater trochanter. Sharply defined lytic lesion of the right inferior pubic ramus measuring 2.4 by 1.3 cm on image 217 of series 12 with sclerotic margination, previously 2.2 by 1.3 cm. IMPRESSION: 1. Mild enlargement of the left lower lobe perihilar mass, and of the right middle lobe/right upper lobe peribronchovascular mass. Increased left pleural effusion, now moderate to large.  Stable small right pleural effusion. Stable small pericardial effusion. 2. Diffuse subcutaneous, mesenteric, and mediastinal edema compatible with third spacing of  fluid. There is some hazy density in the lungs which also may be due to noncardiogenic edema. 3. Stable appearance of multiple pancreatic masses. 4. Stable osseous manifestations of metastatic disease, with the largest lesion in the right sacrum. 5. Emphysema and aortic atherosclerosis. Aortic Atherosclerosis (ICD10-I70.0) and Emphysema (ICD10-J43.9). Electronically Signed   By: Van Clines M.D.   On: 01/31/2021 10:56    Medications: I have reviewed the patient's current medications.  Assessment/Plan:  55 year old man with  1.  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    He is currently on Cabometyx since 2016 with overall stable disease.  CT scan obtained on January 31, 2021 was personally reviewed and discussed with the patient today which showed clear progression of disease.  He still has a reasonable performance status that would warrant additional therapy which will be instituted as an outpatient.  I recommended discontinuation of Cabometyx and will assess his recovery upon discharge and will institute therapy in the form of oral targeted therapy alone or in combination with immunotherapy pending his recovery.  2. Pulmonary embolism:  Remote and incidental previously.  I recommended the discontinuation of Xarelto given the risk of recurrent GI bleeding at this time.  3. Anemia:  He received packed red cell transfusion with stabilization of his hemoglobin.  4.   Generalized anasarca: He is currently receiving diuresis.  Evaluation for heart failure versus kidney failure versus low albumin state currently ongoing.  5.  Pleural effusion: Is scheduled for thoracentesis which will help determine whether there is a malignant effusion at this time.  6. Prognosis:   Remains guarded at this time.  He has an incurable malignancy with multiple comorbidities at this time.  He still desires aggressive measures such as reasonable at this time.   7. Disposition and follow-up: He has follow-up scheduled on February 28 upon his discharge.  We will continue to monitor through his hospitalization.  35  minutes were dedicated to this visit.  50% of the time was face-to-face and was dedicated to reviewing laboratory data, imaging studies, discussing treatment options, and answering questions regarding future plan.     LOS: 1 day   Zola Button 02/01/2021, 10:35 AM

## 2021-02-01 NOTE — ED Notes (Signed)
ED TO INPATIENT HANDOFF REPORT  ED Nurse Name and Phone #: [phone]   S Name/Age/Gender Keith [name] 55 y.o. male Room/Bed: WA05/WA05  Code Status   Code Status: Full Code  Home/SNF/Other Home Patient oriented to: self, place, time and situation Is this baseline? Yes   Triage Complete: Triage complete  Chief Complaint Shortness of breath [R06.02]  Triage Note Pt complains of being short of breath acutely about one hour ago, he tried to go to the bathroom and became extremely short of breath Pt also has swollen feet and swollen elbows that he said has been about two weeks    Allergies Allergies  Allergen Reactions  . Ceftriaxone Hives  . Hydrocodone Swelling    Level of Care/Admitting Diagnosis ED Disposition    ED Disposition Condition Comment   Admit  Hospital Area: [facility] [[id number]] Level of Care: Telemetry [5] Admit to tele based on following criteria: Acute CHF May admit patient to [facility] or [facility] if equivalent level of care is available:: Yes Covid Evaluatio n: covid negative Diagnosis: Shortness of breath [786.05.ICD-9-CM] Admitting Physician: Harold Hedge [[id number]] Attending Physician: Harold Hedge [[id number]] Estimated length of stay: past midnight tomorrow Certification:: I certify this patien t will need inpatient services for at least 2 midnights       B Medical/Surgery History Past Medical History:  Diagnosis Date  . Diabetes mellitus without complication ([place])    diet controlled only  . GI bleed   . left renal ca d'd 2008  . Malignant neoplasm metastatic to pancreas ([place]) 2012  . 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 Metastasis to bone [facility]) 2014/2016   Past Surgical History:  Procedure Laterality Date  . BIOPSY  08/28/2019   Procedure: BIOPSY;  Surgeon: Thornton Park, MD;  Location: [facility] ENDOSCOPY;  Service: Gastroenterology;;  . BIOPSY  03/01/2020   Procedure: BIOPSY;  Surgeon: Mauri Pole, MD;  Location[facility]  ENDOSCOPY;  Service: Endoscopy;;  . BIOPSY  12/19/2020   Procedure: BIOPSY;  Surgeon: Yetta Flock, MD;  [facility];  Service: Gastroenterology;;  . BIOPSY  12/20/2020   Procedure: BIOPSY;  Surgeon: Gatha Mayer, MD;  [facility];  Service: Endoscopy;;  . COLONOSCOPY W/ POLYPECTOMY    . COLONOSCOPY WITH PROPOFOL N/A 03/01/2020   Procedure: COLONOSCOPY WITH PROPOFOL;  Surgeon: Mauri Pole, MD;  [facility];  Service: Endoscopy;  Laterality: N/A;  . COLONOSCOPY WITH PROPOFOL N/A 12/20/2020   Procedure: COLONOSCOPY WITH PROPOFOL;  Surgeon: Gatha Mayer, MD;  [facility];  Service: Endoscopy;  Laterality: N/A;  . ESOPHAGOGASTRODUODENOSCOPY (EGD) WITH PROPOFOL N/A 08/28/2019   Procedure: ESOPHAGOGASTRODUODENOSCOPY (EGD) WITH PROPOFOL;  Surgeon: Thornton Park, MD;  [facility];  Service: Gastroenterology;  Laterality: N/A;  . ESOPHAGOGASTRODUODENOSCOPY (EGD) WITH PROPOFOL N/A 03/01/2020   Procedure: ESOPHAGOGASTRODUODENOSCOPY (EGD) WITH PROPOFOL;  Surgeon: Mauri Pole, MD;  [facility];  Service: Endoscopy;  Laterality: N/A;  . ESOPHAGOGASTRODUODENOSCOPY (EGD) WITH PROPOFOL N/A 12/19/2020   Procedure: ESOPHAGOGASTRODUODENOSCOPY (EGD) WITH PROPOFOL;  Surgeon: Yetta Flock, MD;  [facility];  Service: Gastroenterology;  Laterality: N/A;  . FEMUR IM NAIL Right 02/16/2018   Procedure: INTRAMEDULLARY (IM) NAIL RIGHT FEMORAL;  Surgeon: Paralee Cancel, MD;  [facility];  Service: Orthopedics;  Laterality: Right;  . GIVENS CAPSULE STUDY N/A 03/03/2020   Procedure: GIVENS CAPSULE STUDY;  Surgeon: Mauri Pole, MD;  [facility];  Service: Endoscopy;  Laterality: N/A;  .  HAND SURGERY Right   . HEMOSTASIS CLIP PLACEMENT  12/20/2020   Procedure: HEMOSTASIS CLIP PLACEMENT;  Surgeon: Gatha Mayer, MD;  [facility];  Service: Endoscopy;;  . HOT HEMOSTASIS N/A  12/20/2020   Procedure: HOT HEMOSTASIS (ARGON PLASMA COAGULATION/BICAP);  Surgeon: Gatha Mayer, MD;  [facility]: [facility];  Service: Endoscopy;  Laterality: N/A;  . IR ANGIOGRAM EXTREMITY RIGHT  02/15/2018  . IR ANGIOGRAM SELECTIVE EACH ADDITIONAL VESSEL  02/15/2018  . IR ANGIOGRAM SELECTIVE EACH ADDITIONAL VESSEL  02/15/2018  . IR ANGIOGRAM SELECTIVE EACH ADDITIONAL VESSEL  02/15/2018  . IR ANGIOGRAM SELECTIVE EACH ADDITIONAL VESSEL  02/15/2018  . IR EMBO TUMOR ORGAN ISCHEMIA INFARCT INC GUIDE ROADMAPPING  02/15/2018  . IR EMBO TUMOR ORGAN ISCHEMIA INFARCT INC GUIDE ROADMAPPING  02/15/2018  . IR US GUIDE VASC ACCESS LEFT  02/15/2018  . LUNG REMOVAL, PARTIAL Right 09/2008  . NEPHRECTOMY RADICAL     Left   . PAIN PUMP IMPLANTATION N/A 05/16/2015   Procedure: Intrathecal pain pump placement;  Surgeon: Clydell Hakim, MD;  Location: [facility];  Service: Neurosurgery;  Laterality: N/A;  Intrathecal pain pump placement     A IV Location/Drains/Wounds Patient Lines/Drains/Airways Status    Active Line/Drains/Airways    Name Placement date Placement time Site Days   Peripheral IV 01/31/21 Right Antecubital 01/31/21  0526  Antecubital  1          Intake/Output Last 24 hours No intake or output data in the 24 hours ending 02/01/21 1642  Labs/Imaging Results for orders placed or performed during the hospital encounter of 01/31/21 (from the past 48 hour(s))  Resp Panel by RT-PCR (Flu A&B, Covid) Nasopharyngeal Swab     Status: None   Collection Time: 01/31/21  5:47 AM   Specimen: Nasopharyngeal Swab; Nasopharyngeal(NP) swabs in vial transport medium  Result Value Ref Range   SARS Coronavirus 2 by RT PCR NEGATIVE NEGATIVE    Comment: (NOTE) SARS-CoV-2 target nucleic acids are NOT DETECTED.  The SARS-CoV-2 RNA is generally detectable in upper respiratory specimens during the acute phase of infection. The lowest concentration of SARS-CoV-2 viral copies this assay can detect is 138  copies/mL. A negative result does not preclude SARS-Cov-2 infection and should not be used as the sole basis for treatment or other patient management decisions. A negative result may occur with  improper specimen collection/handling, submission of specimen other than nasopharyngeal swab, presence of viral mutation(s) within the areas targeted by this assay, and inadequate number of viral copies(<138 copies/mL). A negative result must be combined with clinical observations, patient history, and epidemiological information. The expected result is Negative.  Fact Sheet for Patients:  EntrepreneurPulse.com.au  Fact Sheet for Healthcare Providers:  IncredibleEmployment.be  This test is no t yet approved or cleared by the [place] FDA and  has been authorized for detection and/or diagnosis of SARS-CoV-2 by FDA under an Emergency Use Authorization (EUA). This EUA will remain  in effect (meaning this test can be used) for the duration of the COVID-19 declaration under Section 564(b)(1) of the Act, 21 U.S.C.section 360bbb-3(b)(1), unless the authorization is terminated  or revoked sooner.       Influenza A by PCR NEGATIVE NEGATIVE   Influenza B by PCR NEGATIVE NEGATIVE    Comment: (NOTE) The Xpert Xpress SARS-CoV-2/FLU/RSV plus assay is intended as an aid in the diagnosis of influenza from Nasopharyngeal swab specimens and should not be used as a sole basis for treatment. Nasal washings and aspirates are  unacceptable for Xpert Xpress SARS-CoV-2/FLU/RSV testing.  Fact Sheet for Patients: EntrepreneurPulse.com.au  Fact Sheet for Healthcare Providers: IncredibleEmployment.be  This test is not yet approved or cleared by the [place] FDA and has been authorized for detection and/or diagnosis of SARS-CoV-2 by FDA under an Emergency Use Authorization (EUA). This EUA will remain in effect (meaning this test  can be used) for the duration of the COVID-19 declaration under Section 564(b)(1) of the Act, 21 U.S.C. section 360bbb-3(b)(1), unless the authorization is terminated or revoked.  Performed at [facility], [place] [address] [place], [place] [address]   Comprehensive metabolic panel     Status: Abnormal   Collection Time: 01/31/21  6:06 AM  Result Value Ref Range   Sodium 139 135 - 145 mmol/L   Potassium 5.0 3.5 - 5.1 mmol/L   Chloride 104 98 - 111 mmol/L   CO2 26 22 - 32 mmol/L   Glucose, Bld 125 (H) 70 - 99 mg/dL    Comment: Glucose reference range applies only to samples taken after fasting for at least 8 hours.   BUN 34 (H) 6 - 20 mg/dL   Creatinine, Ser 1.80 (H) 0.61 - 1.24 mg/dL   Calcium 8.2 (L) 8.9 - 10.3 mg/dL   Total Protein 5.5 (L) 6.5 - 8.1 g/dL   Albumin 2.2 (L) 3.5 - 5.0 g/dL   AST 14 (L) 15 - 41 U/L   ALT 13 0 - 44 U/L   Alkaline Phosphatase 63 38 - 126 U/L   Total Bilirubin 0.6 0.3 - 1.2 mg/dL   GFR, Estimated 44 (L) >60 mL/min    Comment: (NOTE) Calculated using the CKD-EPI Creatinine Equation (2021)    Anion gap 9 5 - 15    Comment: Performed at [facility], [place] [address] [place], [place] [address]  D-dimer, quantitative (not at [facility])     Status: Abnormal   Collection Time: 01/31/21  6:06 AM  Result Value Ref Range   D-Dimer, Quant 2.80 (H) 0.00 - 0.50 ug/mL-FEU    Comment: (NOTE) At the manufacturer cut-off value of 0.5 g/mL FEU, this assay has a negative predictive value of 95-100%.This assay is intended for use in conjunction with a clinical pretest probability (PTP) assessment model to exclude pulmonary embolism (PE) and deep venous thrombosis (DVT) in outpatients suspected of PE or DVT. Results should be correlated with clinical presentation. Performed at [facility], [place] [address] [place], [place] [address]   Brain natriuretic peptide     Status: Abnormal   Collection Time: 01/31/21   6:06 AM  Result Value Ref Range   B Natriuretic Peptide 341.5 (H) 0.0 - 100.0 pg/mL    Comment: Performed at [facility], [place] [address], [place], [place] [address]  Type and screen     Status: None   Collection Time: 01/31/21  6:06 AM  Result Value Ref Range   ABO/RH(D) A NEG    Antibody Screen NEG    Sample Expiration 02/03/2021,2359    Unit Number [id number]    Blood Component Type RED CELLS,LR    Unit division 00    Status of Unit ISSUED,FINAL    Transfusion Status OK TO TRANSFUSE    Crossmatch Result      Compatible Performed at [place] [address], [place], [place] [address]   Troponin I (High Sensitivity)     Status: Abnormal   Collection Time: 01/31/21  6:21 AM  Result Value  Ref Range   Troponin I (High Sensitivity) 55 (H) <18 ng/L    Comment: (NOTE) Elevated high sensitivity troponin I (hsTnI) values and significant  changes across serial measurements may suggest ACS but many other  chronic and acute conditions are known to elevate hsTnI results.  Refer to the "Links" section for chest pain algorithms and additional  guidance. Performed at [facility], [place] [address] [place], [place] [address]   CBC with Differential/Platelet     Status: Abnormal   Collection Time: 01/31/21  6:35 AM  Result Value Ref Range   WBC 6.8 4.0 - 10.5 K/uL   RBC 1.90 (L) 4.22 - 5.81 MIL/uL   Hemoglobin 6.1 (LL) 13.0 - 17.0 g/dL    Comment: REPEATED TO VERIFY THIS CRITICAL RESULT HAS VERIFIED AND BEEN CALLED TO SMITH,J BY SHANNON POTEAT ON 02 10 2022 AT 0653, AND HAS BEEN READ BACK. CRITICAL RESULT VERIFIED THIS CRITICAL RESULT HAS VERIFIED AND BEEN CALLED TO SMITH,J BY SHANNON POTEAT ON 02 10 2022 AT 75, AND HAS BEEN READ BACK. CRITICAL RESULT VERIFIED    HCT 21.6 (L) 39.0 - 52.0 %   MCV 113.7 (H) 80.0 - 100.0 fL   MCH 32.1 26.0 - 34.0 pg   MCHC 28.2 (L) 30.0 - 36.0 g/dL   RDW 22.4 (H) 11.5 - 15.5 %   Platelets  178 150 - 400 K/uL   nRBC 0.3 (H) 0.0 - 0.2 %   Neutrophils Relative % 80 %   Neutro Abs 5.4 1.7 - 7.7 K/uL   Lymphocytes Relative 8 %   Lymphs Abs 0.6 (L) 0.7 - 4.0 K/uL   Monocytes Relative 10 %   Monocytes Absolute 0.7 0.1 - 1.0 K/uL   Eosinophils Relative 1 %   Eosinophils Absolute 0.1 0.0 - 0.5 K/uL   Basophils Relative 1 %   Basophils Absolute 0.1 0.0 - 0.1 K/uL   Immature Granulocytes 0 %   Abs Immature Granulocytes 0.02 0.00 - 0.07 K/uL    Comment: Performed at [facility], [place] [address] [place], [place] [address]  Prepare RBC (crossmatch)     Status: None   Collection Time: 01/31/21  8:43 AM  Result Value Ref Range   Order Confirmation      ORDER PROCESSED BY BLOOD BANK Performed at [facility], [place] [address], [place], [place] [address]   Troponin I (High Sensitivity)     Status: Abnormal   Collection Time: 01/31/21  9:20 AM  Result Value Ref Range   Troponin I (High Sensitivity) 29 (H) <18 ng/L    Comment: DELTA CHECK NOTED (NOTE) Elevated high sensitivity troponin I (hsTnI) values and significant  changes across serial measurements may suggest ACS but many other  chronic and acute conditions are known to elevate hsTnI results.  Refer to the Links section for chest pain algorithms and additional  guidance. Performed at [facility], [place] [address], [place], [place] [address]   Urinalysis, Routine w reflex microscopic Urine, Clean Catch     Status: Abnormal   Collection Time: 01/31/21  9:28 AM  Result Value Ref Range   Color, Urine YELLOW YELLOW   APPearance CLEAR CLEAR   Specific Gravity, Urine 1.008 1.005 - 1.030   pH 5.0 5.0 - 8.0   Glucose, UA NEGATIVE NEGATIVE mg/dL   Hgb urine dipstick SMALL (A) NEGATIVE   Bilirubin Urine NEGATIVE NEGATIVE   Ketones, ur NEGATIVE NEGATIVE mg/dL   Protein, ur 100 (A)  NEGATIVE mg/dL   Nitrite NEGATIVE NEGATIVE   Leukocytes,Ua NEGATIVE NEGATIVE   RBC / HPF 0-5 0  - 5 RBC/hpf   WBC, UA 0-5 0 - 5 WBC/hpf   Bacteria, UA RARE (A) NONE SEEN   Mucus PRESENT     Comment: Performed at [facility], [place] [address], [place], [place] [address]  Hemoglobin and hematocrit, blood     Status: Abnormal   Collection Time: 01/31/21  5:45 PM  Result Value Ref Range   Hemoglobin 7.4 (L) 13.0 - 17.0 g/dL   HCT 27.1 (L) 39.0 - 52.0 %    Comment: Performed at [facility], [place] [address] [place], [place] [address]  Comprehensive metabolic panel     Status: Abnormal   Collection Time: 02/01/21  4:59 AM  Result Value Ref Range   Sodium 140 135 - 145 mmol/L   Potassium 4.3 3.5 - 5.1 mmol/L   Chloride 104 98 - 111 mmol/L   CO2 27 22 - 32 mmol/L   Glucose, Bld 111 (H) 70 - 99 mg/dL    Comment: Glucose reference range applies only to samples taken after fasting for at least 8 hours.   BUN 32 (H) 6 - 20 mg/dL   Creatinine, Ser 1.63 (H) 0.61 - 1.24 mg/dL   Calcium 8.4 (L) 8.9 - 10.3 mg/dL   Total Protein 6.2 (L) 6.5 - 8.1 g/dL   Albumin 2.4 (L) 3.5 - 5.0 g/dL   AST 16 15 - 41 U/L   ALT 13 0 - 44 U/L   Alkaline Phosphatase 66 38 - 126 U/L   Total Bilirubin 0.6 0.3 - 1.2 mg/dL   GFR, Estimated 50 (L) >60 mL/min    Comment: (NOTE) Calculated using the CKD-EPI Creatinine Equation (2021)    Anion gap 9 5 - 15    Comment: Performed at [facility], [place] [address], [place], [place] [address]  CBC with Differential/Platelet     Status: Abnormal   Collection Time: 02/01/21  4:59 AM  Result Value Ref Range   WBC 5.4 4.0 - 10.5 K/uL   RBC 2.43 (L) 4.22 - 5.81 MIL/uL   Hemoglobin 7.8 (L) 13.0 - 17.0 g/dL   HCT 26.5 (L) 39.0 - 52.0 %   MCV 109.1 (H) 80.0 - 100.0 fL   MCH 32.1 26.0 - 34.0 pg   MCHC 29.4 (L) 30.0 - 36.0 g/dL   RDW 22.3 (H) 11.5 - 15.5 %   Platelets 160 150 - 400 K/uL   nRBC 0.4 (H) 0.0 - 0.2 %   Neutrophils Relative % 64 %   Neutro Abs 3.5 1.7 - 7.7 K/uL   Lymphocytes Relative 15 %   Lymphs Abs  0.8 0.7 - 4.0 K/uL   Monocytes Relative 14 %   Monocytes Absolute 0.7 0.1 - 1.0 K/uL   Eosinophils Relative 6 %   Eosinophils Absolute 0.3 0.0 - 0.5 K/uL   Basophils Relative 1 %   Basophils Absolute 0.1 0.0 - 0.1 K/uL   Immature Granulocytes 0 %   Abs Immature Granulocytes 0.02 0.00 - 0.07 K/uL   Polychromasia PRESENT     Comment: Performed at [facility], [place] [address], [place], [place] [address]  Albumin, pleural or peritoneal fluid     Status: None   Collection Time: 02/01/21 12:24 PM  Result Value Ref Range   Albumin, Fluid <1.0 g/dL    Comment: (NOTE) No normal range established for this test Results  should be evaluated in conjunction with serum values    Fluid Type-FALB PLEURAL     Comment: LT Performed at [facility], [place] [address], [place], [place] [address]   Glucose, pleural or peritoneal fluid     Status: None   Collection Time: 02/01/21 12:24 PM  Result Value Ref Range   Glucose, Fluid 115 mg/dL    Comment: (NOTE) No normal range established for this test Results should be evaluated in conjunction with serum values    Fluid Type-FGLU PLEURAL     Comment: LT Performed at [place] [address], [place], [place] [address]   Lactate dehydrogenase (pleural or peritoneal fluid)     Status: Abnormal   Collection Time: 02/01/21 12:24 PM  Result Value Ref Range   LD, Fluid 78 (H) 3 - 23 U/L    Comment: (NOTE) Results should be evaluated in conjunction with serum values    Fluid Type-FLDH PLEURAL     Comment: LT Performed at [place] [address] [place], [place] [address]    *Note: Due to a large number of results and/or encounters for the requested time period, some results have not been displayed. A complete set of results can be found in Results Review.   CT ABDOMEN PELVIS W WO CONTRAST  Result Date: 01/31/2021 CLINICAL DATA:  Restaging metastatic left renal  cancer, prior nephrectomy. Discontinuation of oral chemotherapy. Shortness of breath. EXAM: CT CHEST WITH CONTRAST CT ABDOMEN AND PELVIS WITH AND WITHOUT CONTRAST TECHNIQUE: Multidetector CT imaging of the chest was performed during intravenous contrast administration. Multidetector CT imaging of the abdomen and pelvis was performed following the standard protocol before and during bolus administration of intravenous contrast. CONTRAST:  64m OMNIPAQUE IOHEXOL 300 MG/ML  SOLN COMPARISON:  11/20/2020 FINDINGS: CT CHEST FINDINGS Cardiovascular: No significant thoracic atherosclerosis. Please note that today's exam was not performed as a CT angiogram and is not considered highly sensitive for pulmonary embolus. Stable small pericardial effusion. Mediastinum/Nodes: 1.3 by 0.9 cm hypodense nodule anteriorly in the right thyroid lobe on image 6 of series 12. Not clinically significant; no follow-up imaging recommended (ref: J Am Coll Radiol. 2015 Feb;12(2): 143-50). There is background stranding in the mediastinum which matches the background stranding in the subcutaneous tissues and likely reflects third spacing of fluid. Lungs/Pleura: Predominantly left lower lobe perihilar mass measures 4.3 by 3.4 cm on image 50 of series 12, previously 3.1 by 3.2 cm by my measurements. Right middle lobe and right upper lobe peribronchovascular mass measures 2.4 cm along its short axis, previously 2.1 cm. Stable thick bandlike scarring with volume loss in the right upper lobe. Underlying emphysema. Scattered scarring in the lungs. There is some hazy ground-glass opacity in the lungs which is increased from prior and could be a subtle manifestation of edema or third spacing of fluid. Moderate to large left pleural effusion has increased from prior. Small right pleural effusion appear stable. Musculoskeletal: Thoracic spondylosis. Stable small anterior sclerotic lesion in the T2 vertebral body. Anomalous medial left second rib. Observe 0  catheter noted terminating at about the T4 level posteriorly. CT ABDOMEN AND PELVIS FINDINGS Hepatobiliary: No well delineated hepatic lesions are observed. Gallbladder unremarkable. Pancreas: Dorsal pancreatic duct dilatation extending to a pancreatic body mass with enhancing portion of this mass measuring 3.6 by 2.0 cm on image 41 of series 7, previously 3.6 by 1.9 cm. Along the anterior margin of the pancreatic head, a 3.1 by 2.3 cm mass  is present and previously measured 2.8 by 2.4 cm. The pancreatic lesions are roughly stable. Questionable third small pancreatic nodule along the pancreatic head on image 54 of series 7 measuring 0.9 cm in diameter, stable. Possible fourth pancreatic nodule along the anterior portion of the pancreatic tail measuring 1.0 cm in diameter, stable. Spleen: Unremarkable Adrenals/Urinary Tract: Left nephrectomy. Adrenal glands unremarkable. The right kidney appears unremarkable. No urinary tract calculi are observed. Urinary bladder unremarkable. Stomach/Bowel: No dilated bowel. Similar appearance of mild diffuse rectal wall thickening compared to previous, possibly incidental. Vascular/Lymphatic: Aortoiliac atherosclerotic vascular disease. No pathologic adenopathy. Reproductive: Unremarkable Other: Diffuse subcutaneous and mesenteric edema suggesting third spacing of fluid. Trace free pelvic fluid, improved from prior. Diffuse stranding in the perirectal space. Musculoskeletal: Left lower abdominal wall infusion pump. Necks chloride akin lucent lesion of the right sacral ala probably representing a metastatic lesion and not appreciably changed from prior. Mild sclerosis along the SI joints. Right proximal femur ORIF, with a lucency in the right proximal femur shown on image 76 of series 13 and also noted on prior exam. Heterotopic ossification above the greater trochanter. Sharply defined lytic lesion of the right inferior pubic ramus measuring 2.4 by 1.3 cm on image 217 of series 12  with sclerotic margination, previously 2.2 by 1.3 cm. IMPRESSION: 1. Mild enlargement of the left lower lobe perihilar mass, and of the right middle lobe/right upper lobe peribronchovascular mass. Increased left pleural effusion, now moderate to large. Stable small right pleural effusion. Stable small pericardial effusion. 2. Diffuse subcutaneous, mesenteric, and mediastinal edema compatible with third spacing of fluid. There is some hazy density in the lungs which also may be due to noncardiogenic edema. 3. Stable appearance of multiple pancreatic masses. 4. Stable osseous manifestations of metastatic disease, with the largest lesion in the right sacrum. 5. Emphysema and aortic atherosclerosis. Aortic Atherosclerosis (ICD10-I70.0) and Emphysema (ICD10-J43.9). Electronically Signed   By: Van Clines M.D.   On: 01/31/2021 10:56   DG Chest 2 View  Result Date: 01/31/2021 CLINICAL DATA:  Shortness of breath.  Metastatic renal cell cancer. EXAM: CHEST - 2 VIEW COMPARISON:  06/30/2020 FINDINGS: Interval progression of spiculated nodular opacity in the suprahilar right lung with progressive right infrahilar opacity in this patient with known metastatic renal cell carcinoma. New left base collapse/consolidation with small left pleural effusion. Tiny right pleural effusion is similar to prior. The cardio pericardial silhouette is enlarged. The visualized bony structures of the thorax show no acute abnormality. IMPRESSION: 1. Interval progression of right suprahilar and right infrahilar pulmonary metastatic disease. 2. New left base collapse/consolidation with small left pleural effusion. Findings may be related to metastatic involvement or pneumonia. Electronically Signed   By: Misty Stanley M.D.   On: 01/31/2021 06:24   CT CHEST W CONTRAST  Result Date: 01/31/2021 CLINICAL DATA:  Restaging metastatic left renal cancer, prior nephrectomy. Discontinuation of oral chemotherapy. Shortness of breath. EXAM: CT  CHEST WITH CONTRAST CT ABDOMEN AND PELVIS WITH AND WITHOUT CONTRAST TECHNIQUE: Multidetector CT imaging of the chest was performed during intravenous contrast administration. Multidetector CT imaging of the abdomen and pelvis was performed following the standard protocol before and during bolus administration of intravenous contrast. CONTRAST:  57m OMNIPAQUE IOHEXOL 300 MG/ML  SOLN COMPARISON:  11/20/2020 FINDINGS: CT CHEST FINDINGS Cardiovascular: No significant thoracic atherosclerosis. Please note that today's exam was not performed as a CT angiogram and is not considered highly sensitive for pulmonary embolus. Stable small pericardial effusion. Mediastinum/Nodes: 1.3 by 0.9 cm hypodense  nodule anteriorly in the right thyroid lobe on image 6 of series 12. Not clinically significant; no follow-up imaging recommended (ref: J Am Coll Radiol. 2015 Feb;12(2): 143-50). There is background stranding in the mediastinum which matches the background stranding in the subcutaneous tissues and likely reflects third spacing of fluid. Lungs/Pleura: Predominantly left lower lobe perihilar mass measures 4.3 by 3.4 cm on image 50 of series 12, previously 3.1 by 3.2 cm by my measurements. Right middle lobe and right upper lobe peribronchovascular mass measures 2.4 cm along its short axis, previously 2.1 cm. Stable thick bandlike scarring with volume loss in the right upper lobe. Underlying emphysema. Scattered scarring in the lungs. There is some hazy ground-glass opacity in the lungs which is increased from prior and could be a subtle manifestation of edema or third spacing of fluid. Moderate to large left pleural effusion has increased from prior. Small right pleural effusion appear stable. Musculoskeletal: Thoracic spondylosis. Stable small anterior sclerotic lesion in the T2 vertebral body. Anomalous medial left second rib. Observe 0 catheter noted terminating at about the T4 level posteriorly. CT ABDOMEN AND PELVIS FINDINGS  Hepatobiliary: No well delineated hepatic lesions are observed. Gallbladder unremarkable. Pancreas: Dorsal pancreatic duct dilatation extending to a pancreatic body mass with enhancing portion of this mass measuring 3.6 by 2.0 cm on image 41 of series 7, previously 3.6 by 1.9 cm. Along the anterior margin of the pancreatic head, a 3.1 by 2.3 cm mass is present and previously measured 2.8 by 2.4 cm. The pancreatic lesions are roughly stable. Questionable third small pancreatic nodule along the pancreatic head on image 54 of series 7 measuring 0.9 cm in diameter, stable. Possible fourth pancreatic nodule along the anterior portion of the pancreatic tail measuring 1.0 cm in diameter, stable. Spleen: Unremarkable Adrenals/Urinary Tract: Left nephrectomy. Adrenal glands unremarkable. The right kidney appears unremarkable. No urinary tract calculi are observed. Urinary bladder unremarkable. Stomach/Bowel: No dilated bowel. Similar appearance of mild diffuse rectal wall thickening compared to previous, possibly incidental. Vascular/Lymphatic: Aortoiliac atherosclerotic vascular disease. No pathologic adenopathy. Reproductive: Unremarkable Other: Diffuse subcutaneous and mesenteric edema suggesting third spacing of fluid. Trace free pelvic fluid, improved from prior. Diffuse stranding in the perirectal space. Musculoskeletal: Left lower abdominal wall infusion pump. Necks chloride akin lucent lesion of the right sacral ala probably representing a metastatic lesion and not appreciably changed from prior. Mild sclerosis along the SI joints. Right proximal femur ORIF, with a lucency in the right proximal femur shown on image 76 of series 13 and also noted on prior exam. Heterotopic ossification above the greater trochanter. Sharply defined lytic lesion of the right inferior pubic ramus measuring 2.4 by 1.3 cm on image 217 of series 12 with sclerotic margination, previously 2.2 by 1.3 cm. IMPRESSION: 1. Mild enlargement of the  left lower lobe perihilar mass, and of the right middle lobe/right upper lobe peribronchovascular mass. Increased left pleural effusion, now moderate to large. Stable small right pleural effusion. Stable small pericardial effusion. 2. Diffuse subcutaneous, mesenteric, and mediastinal edema compatible with third spacing of fluid. There is some hazy density in the lungs which also may be due to noncardiogenic edema. 3. Stable appearance of multiple pancreatic masses. 4. Stable osseous manifestations of metastatic disease, with the largest lesion in the right sacrum. 5. Emphysema and aortic atherosclerosis. Aortic Atherosclerosis (ICD10-I70.0) and Emphysema (ICD10-J43.9). Electronically Signed   By: Van Clines M.D.   On: 01/31/2021 10:56   DG CHEST PORT 1 VIEW  Result Date: 02/01/2021 CLINICAL DATA:  Status post left thoracentesis. EXAM: PORTABLE CHEST 1 VIEW COMPARISON:  CT chest and chest radiograph from January 31, 2021. FINDINGS: Right suprahilar and left infrahilar metastatic disease, better characterized on recent CT chest. Interval left thoracentesis with decreased size of a layering small left pleural effusion. Hazy left basilar opacity. No visible pneumothorax on this semi erect radiograph. Similar small right pleural effusion. Similar enlarged cardiac silhouette. IMPRESSION: 1. Interval left thoracentesis with decreased layering small left pleural effusion. Hazy left basilar opacity likely is secondary to layering pleural effusion and atelectasis. No visible pneumothorax on this semi erect radiograph. 2. Similar small right pleural effusion. 3. Right suprahilar and left infrahilar metastatic disease, better characterized on recent CT chest. Electronically Signed   By: Margaretha Sheffield MD   On: 02/01/2021 13:01   ECHOCARDIOGRAM COMPLETE  Result Date: 02/01/2021    ECHOCARDIOGRAM REPORT   Patient Name:   PARNELL SPIELER Preece Date of Exam: 02/01/2021 Medical Rec #:  [id number]       Height:       75.0  in Accession #:    [id number]      Weight:       191.0 lb Date of Birth:  03-Mar-1966       BSA:          2.152 m Patient Age:    73 years        BP:           127/67 mmHg Patient Gender: M               HR:           99 bpm. Exam Location:  Inpatient Procedure: 2D Echo, Cardiac Doppler and Color Doppler Indications:    Dyspnea R06.00  History:        Patient has prior history of Echocardiogram examinations, most                 recent 11/07/2015. Risk Factors:Diabetes. Cancer.  Sonographer:    Jonelle Sidle Dance Referring Phys: [id number] [place]  1. Left ventricular ejection fraction, by estimation, is 40 to 45%. The left ventricle has mild to moderately decreased function. The left ventricle demonstrates global hypokinesis. There is mild left ventricular hypertrophy. Left ventricular diastolic parameters are consistent with Grade I diastolic dysfunction (impaired relaxation).  2. Right ventricular systolic function is normal. The right ventricular size is normal. Tricuspid regurgitation signal is inadequate for assessing PA pressure.  3. Mild diastolic collapse.  4. A small pericardial effusion is present. The pericardial effusion is circumferential. There is no evidence of cardiac tamponade. Large pleural effusion in both left and right lateral regions.  5. The mitral valve is grossly normal. Trivial mitral valve regurgitation.  6. The aortic valve is tricuspid. Aortic valve regurgitation is not visualized.  7. The inferior vena cava is dilated in size with <50% respiratory variability, suggesting right atrial pressure of 15 mmHg. FINDINGS  Left Ventricle: Left ventricular ejection fraction, by estimation, is 40 to 45%. The left ventricle has mild to moderately decreased function. The left ventricle demonstrates global hypokinesis. The left ventricular internal cavity size was normal in size. There is mild left ventricular hypertrophy. Left ventricular diastolic parameters are consistent with Grade I  diastolic dysfunction (impaired relaxation). Indeterminate filling pressures. Right Ventricle: The right ventricular size is normal. No increase in right ventricular wall thickness. Right ventricular systolic function is normal. Tricuspid regurgitation signal is inadequate for assessing PA pressure. Left Atrium: Left atrial size was  normal in size. Right Atrium: Mild diastolic collapse. Right atrial size was normal in size. Pericardium: A small pericardial effusion is present. The pericardial effusion is circumferential. There is diastolic collapse of the right atrial wall. There is no evidence of cardiac tamponade. Mitral Valve: The mitral valve is grossly normal. Trivial mitral valve regurgitation. Tricuspid Valve: The tricuspid valve is grossly normal. Tricuspid valve regurgitation is trivial. Aortic Valve: The aortic valve is tricuspid. Aortic valve regurgitation is not visualized. Pulmonic Valve: The pulmonic valve was not well visualized. Pulmonic valve regurgitation is not visualized. Aorta: The aortic root and ascending aorta are structurally normal, with no evidence of dilitation. Venous: The inferior vena cava is dilated in size with less than 50% respiratory variability, suggesting right atrial pressure of 15 mmHg. IAS/Shunts: No atrial level shunt detected by color flow Doppler. Additional Comments: There is a large pleural effusion in both left and right lateral regions.  LEFT VENTRICLE PLAX 2D LVIDd:         5.00 cm  Diastology LVIDs:         4.44 cm  LV e' medial:    6.47 cm/s LV PW:         1.10 cm  LV E/e' medial:  12.0 LV IVS:        0.72 cm  LV e' lateral:   7.71 cm/s LVOT diam:     2.10 cm  LV E/e' lateral: 10.1 LV SV:         52 LV SV Index:   24 LVOT Area:     3.46 cm  RIGHT VENTRICLE             IVC RV Basal diam:  2.66 cm     IVC diam: 2.31 cm RV S prime:     15.90 cm/s TAPSE (M-mode): 2.1 cm LEFT ATRIUM         Index LA diam:    3.00 cm 1.39 cm/m  AORTIC VALVE LVOT Vmax:   99.30 cm/s LVOT  Vmean:  64.500 cm/s LVOT VTI:    0.151 m  AORTA Ao Root diam: 3.70 cm Ao Asc diam:  3.40 cm MITRAL VALVE MV Area (PHT): 3.08 cm    SHUNTS MV Decel Time: 246 msec    Systemic VTI:  0.15 m MV E velocity: 77.70 cm/s  Systemic Diam: 2.10 cm MV A velocity: 98.00 cm/s MV E/A ratio:  0.79 Lyman Bishop MD Electronically signed by Lyman Bishop MD Signature Date/Time: 02/01/2021/12:52:38 PM    Final     Pending Labs Unresulted Labs (From admission, onward)          Start     Ordered   02/02/21 2595  Basic metabolic panel  Daily,   R      02/01/21 1222   02/01/21 1224  Body fluid cell count with differential  (Thoracentesis Labs Panel)  Once,   STAT       Question:  Are there also cytology or pathology orders on this specimen?  Answer:  Yes   02/01/21 1224   02/01/21 1224  PH, Body Fluid  (Thoracentesis Labs Panel)  Once,   STAT       Comments: Send specimen on ice.    02/01/21 1224   02/01/21 1224  Body fluid culture (includes gram stain)  (Thoracentesis Labs Panel)  Once,   STAT       Question:  Are there also cytology or pathology orders on this specimen?  Answer:  Yes   02/01/21  1224   02/01/21 1224  Lactate dehydrogenase  (Thoracentesis Labs Panel)  Once,   STAT        02/01/21 1224   02/01/21 1224  Albumin  (Thoracentesis Labs Panel)  Once,   STAT        02/01/21 1224   02/01/21 0500  CBC  Daily,   R      01/31/21 0848   01/31/21 0849  TSH  Add-on,   AD        01/31/21 0848   01/31/21 0544  CBC with Differential/Platelet  Once,   STAT        01/31/21 0544          Vitals/Pain Today's Vitals   02/01/21 0737 02/01/21 0930 02/01/21 1343 02/01/21 1600  BP: (!) 141/89 121/89 (!) 126/96 (!) 134/93  Pulse: 92 (!) 105 90 95  Resp: (!) 22 18 14 13   Temp:      TempSrc:      SpO2: 100% 92% 97% 92%  PainSc: 0-No pain       Isolation Precautions No active isolations  Medications Medications  acetaminophen (TYLENOL) tablet 650 mg (has no administration in time range)   HYDROmorphone (DILAUDID) tablet 8 mg (8 mg Oral Given 02/01/21 0102)  ALPRAZolam (XANAX) tablet 1 mg (1 mg Oral Given 02/01/21 0102)  pantoprazole (PROTONIX) EC tablet 40 mg (40 mg Oral Given 02/01/21 1000)  furosemide (LASIX) injection 40 mg (40 mg Intravenous Given 02/01/21 0734)  sodium chloride flush (NS) 0.9 % injection 3 mL (3 mLs Intravenous Not Given 02/01/21 0914)  ondansetron (ZOFRAN) tablet 4 mg (has no administration in time range)    Or  ondansetron (ZOFRAN) injection 4 mg (has no administration in time range)  metoprolol tartrate (LOPRESSOR) injection 5 mg (has no administration in time range)  Morphine INTRATHECAL pump (has no administration in time range)  furosemide (LASIX) injection 40 mg (40 mg Intravenous Given 01/31/21 0734)  0.9 %  sodium chloride infusion (Manually program via Guardrails IV Fluids) ( Intravenous Stopped 01/31/21 1401)  furosemide (LASIX) injection 20 mg (20 mg Intravenous Given 01/31/21 1323)  iohexol (OMNIPAQUE) 300 MG/ML solution 100 mL (75 mLs Intravenous Contrast Given 01/31/21 1020)    Mobility manual wheelchair Low fall risk   Focused Assessments N/A   R Recommendations: See Admitting Provider Note  Report given to:   Additional Notes:

## 2021-02-01 NOTE — ED Notes (Signed)
ECHO at bedside.

## 2021-02-01 NOTE — Progress Notes (Signed)
NAME:  Keith [name], MRN:  [id number], DOB:  1966/01/14, LOS: 1 ADMISSION DATE:  01/31/2021, CONSULTATION DATE:  01/31/2021 REFERRING MD:  Dr. Neysa Bonito , CHIEF COMPLAINT:  Pleural effusion    Brief History:  55yo male presented with SOB in the setting of metastatic renal cell carcinoma. Imaging with moderate moderate to large left pleural effusion prompting pulmonary consult  History of Present Illness:  Keith [name] is a 55 y.o. 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 Patient reports dyspnea acutely worsened day of admission and was the main driver behind coming to the ED. He report the dyspnea is worsened with exertion and improved with rest . Also report dry non-productive cough and orthopnea.   On arrival to ED he was seen with mild tachypnea, mild tachycardia, and hypertensive. Labwork revealed multiple abnormalities including; BUN 34, creatinine 1.80, albumin 2.2, BNP 341.5, HS troponin 55, hgb 6.1, and hct 21.6. CT scan of the chest/ABD/Pelvis was obtained and revealed; increased pulmonary masses bilaterally, increased size of left pleural effusion, diffuse third spacing of fluid.   Given the increased size of pleural effusion with subjective dyspnea PCCM was consulted for possible need of thoracentesis.   Past Medical History:  Metastatic renal cell carcinoma with mets to the lung and bone, S/P nephrectomy, tobacco abuse, PE on Xarelto, recurrent GI bleeds, and anemia   Significant Hospital Events:  Admitted 2/10  Consults:  Gi Oncology   Procedures:  Left thoracentesis 2/11 > 500 cc of clear yellow urine removed  Significant Diagnostic Tests:  CT Chest/ ABD/ Pelvis  1. Mild enlargement of the left lower lobe perihilar mass, and  of the right middle lobe/right upper lobe peribronchovascular mass. Increased left pleural effusion, now moderate to large. Stable small right pleural effusion. Stable small pericardial effusion. 2. Diffuse subcutaneous, mesenteric, and mediastinal edema compatible with third spacing of fluid. There is some hazy density in the lungs which also may be due to noncardiogenic edema. 3. Stable appearance of multiple pancreatic masses. 4. Stable osseous manifestations of metastatic disease, with the largest lesion in the right sacrum. 5. Emphysema and aortic atherosclerosis.  Micro Data:  COVID 2/10 > Negative   Antimicrobials:    Interim History / Subjective:  Patient again seen in ED with wife at bedside in no acute distress.  Verbalized understanding for need of thoracentesis and he and wife agreed.  Objective   Blood pressure (!) 126/96, pulse 90, temperature 97.6 F (36.4 C), temperature source Oral, resp. rate 14, SpO2 97 %.       No intake or output data in the 24 hours ending 02/01/21 1447 There were no vitals filed for this visit.  Examination: General: Very pleasant acute on chronic thin middle-age male lying in bed in no acute distress HEENT: [place]/AT, MM pink/moist, PERRL,  Neuro: Alert and oriented x3 CV: s1s2 regular rate and rhythm, no murmur, rubs, or gallops,  PULM: Diminished breath sounds in bilateral bases, left greater than right, no increased work of breathing, tolerating transition to room air intermittently GI: soft, bowel sounds active in all 4 quadrants, non-tender, non-distended Extremities: warm/dry, 2+ lower extremity pitting edema  Skin: no rashes or lesions  Resolved Hospital Problem list     Assessment & Plan:  Moderate to  large left pleural effusion, multifactorial  -Secondary to metastatic renal cell carcinoma with mets to the lung, pancrease, and bone  -Patient has also developed severe anasarca with significant volume overload  P:  Patient  underwent left thoracentesis 2/11 with 500 cc of clear yellow fluid removed Xarelto remains on hold Diurese as able Mobilize as able Encourage pulmonary hygiene Follow-up on pleural fluid labs including cytology  PCCM will be available as needed over the weekend, team will round on Monday or when cytology results are available.  Please call if additional assistance is needed before.  Best practice (evaluated daily)  Diet: Low salt diet  Pain/Anxiety/Delirium protocol (if indicated): As needed  VAP protocol (if indicated): N/A DVT prophylaxis: Xarelto on hold  GI prophylaxis: PPI Glucose control: SSI Mobility: Up as tolerated  Disposition:per primary   Labs   CBC: Recent Labs  Lab 01/31/21 0635 01/31/21 1745 02/01/21 0459  WBC 6.8  --  5.4  NEUTROABS 5.4  --  3.5  HGB 6.1* 7.4* 7.8*  HCT 21.6* 27.1* 26.5*  MCV 113.7*  --  109.1*  PLT 178  --  970    Basic Metabolic Panel: Recent Labs  Lab 01/31/21 0606 02/01/21 0459  NA 139 140  K 5.0 4.3  CL 104 104  CO2 26 27  GLUCOSE 125* 111*  BUN 34* 32*  CREATININE 1.80* 1.63*  CALCIUM 8.2* 8.4*   GFR: Estimated Creatinine Clearance: 62.6 mL/min (A) (by C-G formula based on SCr of 1.63 mg/dL (H)). Recent Labs  Lab 01/31/21 0635 02/01/21 0459  WBC 6.8 5.4    Liver Function Tests: Recent Labs  Lab 01/31/21 0606 02/01/21 0459  AST 14* 16  ALT 13 13  ALKPHOS 63 66  BILITOT 0.6 0.6  PROT 5.5* 6.2*  ALBUMIN 2.2* 2.4*   No results for input(s): LIPASE, AMYLASE in the last 168 hours. No results for input(s): AMMONIA in the last 168 hours.  ABG    Component Value Date/Time   PHART 7.393 09/27/2008 0400   PCO2ART 42.5 09/27/2008 0400   PO2ART 53.1 (L) 09/27/2008 0400   HCO3 25.3 (H) 09/27/2008 0400   TCO2 26.6 09/27/2008 0400   ACIDBASEDEF 1.9 09/25/2008 1003   O2SAT 87.2 09/27/2008 0400     Coagulation Profile: No results for input(s): INR, PROTIME in the last 168 hours.  Cardiac Enzymes: No results  for input(s): CKTOTAL, CKMB, CKMBINDEX, TROPONINI in the last 168 hours.  HbA1C: Hgb A1c MFr Bld  Date/Time Value Ref Range Status  02/29/2020 07:34 AM 4.9 4.8 - 5.6 % Final    Comment:    (NOTE) Pre diabetes:          5.7%-6.4% Diabetes:              >6.4% Glycemic control for   <7.0% adults with diabetes   08/29/2019 02:36 AM <4.2 (L) 4.8 - 5.6 % Final    Comment:    (NOTE) **Verified by repeat analysis**         Prediabetes: 5.7 - 6.4         Diabetes: >6.4         Glycemic control for adults with diabetes: <7.0     CBG: No results for input(s): GLUCAP in the last 168 hours.   Signature:   Johnsie Cancel, NP-C [facility] Personal contact information can be found on Amion  If no response please page: Adult pulmonary and critical care medicine pager on Amion unitl 7pm After  7pm please call [phone] 02/01/2021, 2:47 PM

## 2021-02-01 NOTE — Procedures (Signed)
Thoracentesis  Procedure Note  Keith [name]  [id number]  08-31-1966  Date:02/01/21  Time:1:56 PM   Provider Performing:Keith [name]   Procedure: Thoracentesis with imaging guidance (70623)  Indication(s) Pleural Effusion  Consent Risks of the procedure as well as the alternatives and risks of each were explained to the patient and/or caregiver.  Consent for the procedure was obtained and is signed in the bedside chart  Anesthesia Topical only with 1% lidocaine    Time Out Verified patient identification, verified procedure, site/side was marked, verified correct patient position, special equipment/implants available, medications/allergies/relevant history reviewed, required imaging and test results available.   Sterile Technique Maximal sterile technique including full sterile barrier drape, hand hygiene, sterile gown, sterile gloves, mask, hair covering, sterile ultrasound probe cover (if used).  Procedure Description Ultrasound was used to identify appropriate pleural anatomy for placement and overlying skin marked.  Area of drainage cleaned and draped in sterile fashion. Lidocaine was used to anesthetize the skin and subcutaneous tissue.  500 cc's of clear light yellow appearing fluid was drained from the left pleural space. Catheter then removed and bandaid applied to site.   Complications/Tolerance None; patient tolerated the procedure well. Chest X-ray is ordered to confirm no post-procedural complication.   EBL Minimal   Specimen(s) Pleural fluid for chemistry, micro & cytology  Keith Senn V. Elsworth Soho MD

## 2021-02-01 NOTE — Progress Notes (Signed)
Progress Note  Chief Complaint:   anemia      ASSESSMENT / PLAN:    # 55 yo male metastatic RCC with acute on chronic anemia,  ongoing intermittent GI bleeding on Xarelto   Hospitalized in March 2021 and late December 2021 with GI bleeding and profound anemia on Xarelto. No source of bleeding on EGD,colonoscopy andVCE ( poor visualization) in March nor on EGD and colonoscopy in December. He was scheduled for outpatient repeat VCE today ON Xarelto but ended up hospitalized with [facility] ( multifactorial with pleural effusion / worsening anemia) --Dr. Ardis Hughs discussed case with HemeOnc yesterday and it was decided that patient could stop Xarelto indefinitely. We cancelled VCE but if rebleeds off Xarelto then will continue GI workup.  GI will sign off, call with questions.  --No overt bleeding in several days. Hgb improved post transfusion from 6.1 to 7.8.    # Anasarca / moderate to large left pleural effusion. Rule out CHF. Echo results pending. Getting thoracentesis in a few minutes.   # RCC, On Cabometyx for a long time. Has also been on other therapies at various times. Several days ago [facility] held [place] given anasarca, SHOB, anemia and GI bleed. Chest imaging suggests enlargement of left and right lung masses   # Pancreatic lesions, stable on today's CT scan. Metastatic lesions?   # History of B12 deficiency, taking B12 supplement.        SUBJECTIVE:   No overt GI bleeding. In good sprits as always. Breathing okay but hasn't been ambulating. Wife in room   Scheduled inpatient medications:  . furosemide  40 mg Intravenous BID  . pantoprazole  40 mg Oral Daily  . sodium chloride flush  3 mL Intravenous Q12H   Continuous inpatient infusions:  [name] Morphine INTRATHECAL pump     PRN inpatient medications: acetaminophen, ALPRAZolam, HYDROmorphone, metoprolol tartrate, ondansetron **OR** ondansetron (ZOFRAN) IV  Vital signs in last 24 hours: Temp:  [97.3 F (36.3 C)-97.6  F (36.4 C)] 97.6 F (36.4 C) (02/11 0615) Pulse Rate:  [90-123] 105 (02/11 0930) Resp:  [12-24] 18 (02/11 0930) BP: (121-154)/(89-100) 121/89 (02/11 0930) SpO2:  [92 %-100 %] 92 % (02/11 0930)    Intake/Output Summary (Last 24 hours) at 02/01/2021 1134 Last data filed at 01/31/2021 1401 Gross per 24 hour  Intake 335 ml  Output --  Net 335 ml     Physical Exam:  . General: Alert thin male in NAD . Heart:  Regular rate and rhythm. Pitting edema of BLE . Pulmonary: Normal respiratory effort. Diminished breath sounds in BLL . Abdomen: Soft, nondistended, nontender. Normal bowel sounds.  . Neurologic: Alert and oriented . Psych: Pleasant. Cooperative.   There were no vitals filed for this visit.  Intake/Output from previous day: 02/10 0701 - 02/11 0700 In: 1150 [I.V.:520; Blood:630] Out: -  Intake/Output this shift: No intake/output data recorded.    Lab Results: Recent Labs    01/31/21 0635 01/31/21 1745 02/01/21 0459  WBC 6.8  --  5.4  HGB 6.1* 7.4* 7.8*  HCT 21.6* 27.1* 26.5*  PLT 178  --  160   BMET Recent Labs    01/31/21 0606 02/01/21 0459  NA 139 140  K 5.0 4.3  CL 104 104  CO2 26 27  GLUCOSE 125* 111*  BUN 34* 32*  CREATININE 1.80* 1.63*  CALCIUM 8.2* 8.4*   LFT Recent Labs    02/01/21 0459  PROT 6.2*  ALBUMIN 2.4*  AST 16  ALT 13  ALKPHOS 66  BILITOT 0.6   PT/INR No results for input(s): LABPROT, INR in the last 72 hours. Hepatitis Panel No results for input(s): HEPBSAG, HCVAB, HEPAIGM, HEPBIGM in the last 72 hours.  CT ABDOMEN PELVIS W WO CONTRAST  Result Date: 01/31/2021 CLINICAL DATA:  Restaging metastatic left renal cancer, prior nephrectomy. Discontinuation of oral chemotherapy. Shortness of breath. EXAM: CT CHEST WITH CONTRAST CT ABDOMEN AND PELVIS WITH AND WITHOUT CONTRAST TECHNIQUE: Multidetector CT imaging of the chest was performed during intravenous contrast administration. Multidetector CT imaging of the abdomen and pelvis  was performed following the standard protocol before and during bolus administration of intravenous contrast. CONTRAST:  49mL OMNIPAQUE IOHEXOL 300 MG/ML  SOLN COMPARISON:  11/20/2020 FINDINGS: CT CHEST FINDINGS Cardiovascular: No significant thoracic atherosclerosis. Please note that today's exam was not performed as a CT angiogram and is not considered highly sensitive for pulmonary embolus. Stable small pericardial effusion. Mediastinum/Nodes: 1.3 by 0.9 cm hypodense nodule anteriorly in the right thyroid lobe on image 6 of series 12. Not clinically significant; no follow-up imaging recommended (ref: J Am Coll Radiol. 2015 Feb;12(2): 143-50). There is background stranding in the mediastinum which matches the background stranding in the subcutaneous tissues and likely reflects third spacing of fluid. Lungs/Pleura: Predominantly left lower lobe perihilar mass measures 4.3 by 3.4 cm on image 50 of series 12, previously 3.1 by 3.2 cm by my measurements. Right middle lobe and right upper lobe peribronchovascular mass measures 2.4 cm along its short axis, previously 2.1 cm. Stable thick bandlike scarring with volume loss in the right upper lobe. Underlying emphysema. Scattered scarring in the lungs. There is some hazy ground-glass opacity in the lungs which is increased from prior and could be a subtle manifestation of edema or third spacing of fluid. Moderate to large left pleural effusion has increased from prior. Small right pleural effusion appear stable. Musculoskeletal: Thoracic spondylosis. Stable small anterior sclerotic lesion in the T2 vertebral body. Anomalous medial left second rib. Observe 0 catheter noted terminating at about the T4 level posteriorly. CT ABDOMEN AND PELVIS FINDINGS Hepatobiliary: No well delineated hepatic lesions are observed. Gallbladder unremarkable. Pancreas: Dorsal pancreatic duct dilatation extending to a pancreatic body mass with enhancing portion of this mass measuring 3.6 by 2.0  cm on image 41 of series 7, previously 3.6 by 1.9 cm. Along the anterior margin of the pancreatic head, a 3.1 by 2.3 cm mass is present and previously measured 2.8 by 2.4 cm. The pancreatic lesions are roughly stable. Questionable third small pancreatic nodule along the pancreatic head on image 54 of series 7 measuring 0.9 cm in diameter, stable. Possible fourth pancreatic nodule along the anterior portion of the pancreatic tail measuring 1.0 cm in diameter, stable. Spleen: Unremarkable Adrenals/Urinary Tract: Left nephrectomy. Adrenal glands unremarkable. The right kidney appears unremarkable. No urinary tract calculi are observed. Urinary bladder unremarkable. Stomach/Bowel: No dilated bowel. Similar appearance of mild diffuse rectal wall thickening compared to previous, possibly incidental. Vascular/Lymphatic: Aortoiliac atherosclerotic vascular disease. No pathologic adenopathy. Reproductive: Unremarkable Other: Diffuse subcutaneous and mesenteric edema suggesting third spacing of fluid. Trace free pelvic fluid, improved from prior. Diffuse stranding in the perirectal space. Musculoskeletal: Left lower abdominal wall infusion pump. Necks chloride akin lucent lesion of the right sacral ala probably representing a metastatic lesion and not appreciably changed from prior. Mild sclerosis along the SI joints. Right proximal femur ORIF, with a lucency in the right proximal femur shown on image 76 of series 13 and also noted  on prior exam. Heterotopic ossification above the greater trochanter. Sharply defined lytic lesion of the right inferior pubic ramus measuring 2.4 by 1.3 cm on image 217 of series 12 with sclerotic margination, previously 2.2 by 1.3 cm. IMPRESSION: 1. Mild enlargement of the left lower lobe perihilar mass, and of the right middle lobe/right upper lobe peribronchovascular mass. Increased left pleural effusion, now moderate to large. Stable small right pleural effusion. Stable small pericardial  effusion. 2. Diffuse subcutaneous, mesenteric, and mediastinal edema compatible with third spacing of fluid. There is some hazy density in the lungs which also may be due to noncardiogenic edema. 3. Stable appearance of multiple pancreatic masses. 4. Stable osseous manifestations of metastatic disease, with the largest lesion in the right sacrum. 5. Emphysema and aortic atherosclerosis. Aortic Atherosclerosis (ICD10-I70.0) and Emphysema (ICD10-J43.9). Electronically Signed   By: Van Clines M.D.   On: 01/31/2021 10:56   DG Chest 2 View  Result Date: 01/31/2021 CLINICAL DATA:  Shortness of breath.  Metastatic renal cell cancer. EXAM: CHEST - 2 VIEW COMPARISON:  06/30/2020 FINDINGS: Interval progression of spiculated nodular opacity in the suprahilar right lung with progressive right infrahilar opacity in this patient with known metastatic renal cell carcinoma. New left base collapse/consolidation with small left pleural effusion. Tiny right pleural effusion is similar to prior. The cardio pericardial silhouette is enlarged. The visualized bony structures of the thorax show no acute abnormality. IMPRESSION: 1. Interval progression of right suprahilar and right infrahilar pulmonary metastatic disease. 2. New left base collapse/consolidation with small left pleural effusion. Findings may be related to metastatic involvement or pneumonia. Electronically Signed   By: Misty Stanley M.D.   On: 01/31/2021 06:24   CT CHEST W CONTRAST  Result Date: 01/31/2021 CLINICAL DATA:  Restaging metastatic left renal cancer, prior nephrectomy. Discontinuation of oral chemotherapy. Shortness of breath. EXAM: CT CHEST WITH CONTRAST CT ABDOMEN AND PELVIS WITH AND WITHOUT CONTRAST TECHNIQUE: Multidetector CT imaging of the chest was performed during intravenous contrast administration. Multidetector CT imaging of the abdomen and pelvis was performed following the standard protocol before and during bolus administration of  intravenous contrast. CONTRAST:  60mL OMNIPAQUE IOHEXOL 300 MG/ML  SOLN COMPARISON:  11/20/2020 FINDINGS: CT CHEST FINDINGS Cardiovascular: No significant thoracic atherosclerosis. Please note that today's exam was not performed as a CT angiogram and is not considered highly sensitive for pulmonary embolus. Stable small pericardial effusion. Mediastinum/Nodes: 1.3 by 0.9 cm hypodense nodule anteriorly in the right thyroid lobe on image 6 of series 12. Not clinically significant; no follow-up imaging recommended (ref: J Am Coll Radiol. 2015 Feb;12(2): 143-50). There is background stranding in the mediastinum which matches the background stranding in the subcutaneous tissues and likely reflects third spacing of fluid. Lungs/Pleura: Predominantly left lower lobe perihilar mass measures 4.3 by 3.4 cm on image 50 of series 12, previously 3.1 by 3.2 cm by my measurements. Right middle lobe and right upper lobe peribronchovascular mass measures 2.4 cm along its short axis, previously 2.1 cm. Stable thick bandlike scarring with volume loss in the right upper lobe. Underlying emphysema. Scattered scarring in the lungs. There is some hazy ground-glass opacity in the lungs which is increased from prior and could be a subtle manifestation of edema or third spacing of fluid. Moderate to large left pleural effusion has increased from prior. Small right pleural effusion appear stable. Musculoskeletal: Thoracic spondylosis. Stable small anterior sclerotic lesion in the T2 vertebral body. Anomalous medial left second rib. Observe 0 catheter noted terminating at about the  T4 level posteriorly. CT ABDOMEN AND PELVIS FINDINGS Hepatobiliary: No well delineated hepatic lesions are observed. Gallbladder unremarkable. Pancreas: Dorsal pancreatic duct dilatation extending to a pancreatic body mass with enhancing portion of this mass measuring 3.6 by 2.0 cm on image 41 of series 7, previously 3.6 by 1.9 cm. Along the anterior margin of the  pancreatic head, a 3.1 by 2.3 cm mass is present and previously measured 2.8 by 2.4 cm. The pancreatic lesions are roughly stable. Questionable third small pancreatic nodule along the pancreatic head on image 54 of series 7 measuring 0.9 cm in diameter, stable. Possible fourth pancreatic nodule along the anterior portion of the pancreatic tail measuring 1.0 cm in diameter, stable. Spleen: Unremarkable Adrenals/Urinary Tract: Left nephrectomy. Adrenal glands unremarkable. The right kidney appears unremarkable. No urinary tract calculi are observed. Urinary bladder unremarkable. Stomach/Bowel: No dilated bowel. Similar appearance of mild diffuse rectal wall thickening compared to previous, possibly incidental. Vascular/Lymphatic: Aortoiliac atherosclerotic vascular disease. No pathologic adenopathy. Reproductive: Unremarkable Other: Diffuse subcutaneous and mesenteric edema suggesting third spacing of fluid. Trace free pelvic fluid, improved from prior. Diffuse stranding in the perirectal space. Musculoskeletal: Left lower abdominal wall infusion pump. Necks chloride akin lucent lesion of the right sacral ala probably representing a metastatic lesion and not appreciably changed from prior. Mild sclerosis along the SI joints. Right proximal femur ORIF, with a lucency in the right proximal femur shown on image 76 of series 13 and also noted on prior exam. Heterotopic ossification above the greater trochanter. Sharply defined lytic lesion of the right inferior pubic ramus measuring 2.4 by 1.3 cm on image 217 of series 12 with sclerotic margination, previously 2.2 by 1.3 cm. IMPRESSION: 1. Mild enlargement of the left lower lobe perihilar mass, and of the right middle lobe/right upper lobe peribronchovascular mass. Increased left pleural effusion, now moderate to large. Stable small right pleural effusion. Stable small pericardial effusion. 2. Diffuse subcutaneous, mesenteric, and mediastinal edema compatible with third  spacing of fluid. There is some hazy density in the lungs which also may be due to noncardiogenic edema. 3. Stable appearance of multiple pancreatic masses. 4. Stable osseous manifestations of metastatic disease, with the largest lesion in the right sacrum. 5. Emphysema and aortic atherosclerosis. Aortic Atherosclerosis (ICD10-I70.0) and Emphysema (ICD10-J43.9). Electronically Signed   By: Van Clines M.D.   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

## 2021-02-01 NOTE — Progress Notes (Signed)
  Echocardiogram 2D Echocardiogram has been performed.  Keith [name] Keith [name] 02/01/2021, 11:57 AM

## 2021-02-01 NOTE — Progress Notes (Addendum)
PROGRESS NOTE    Keith [name]  ZOX:[id number] DOB: 05/11/1966 DOA: 01/31/2021 PCP: [name], MD     Brief Narrative:  Keith [name] is a 55 year old male with past medical history significant for metastatic renal cell carcinoma to the lung and bone status post radical nephrectomy and radiotherapy, cancer-related pain with morphine intrathecal pump, PE on Xarelto, recurrent GI bleeding and anemia who presented to the emergency department with complaints of generalized weakness, lower extremity edema, shortness of breath.  He admits to dyspnea worse with exertion, associated with dry cough and scrotal swelling as well as orthopnea.  He was seen by his oncologist as an outpatient on 01/22/2021 and was prescribed Lasix for generalized anasarca and his cabozantinib was on hold due to his poor health.  Patient has not had much improvement with Lasix.  He was also evaluated by [facility] on 01/24/2021 with plans for outpatient video capsule study due to GI bleeding.  Patient was admitted for acute hypoxic respiratory failure, anasarca, symptomatic anemia.  He was given 1 unit packed red blood cell and IV Lasix.  PCCM, oncology, GI consulted.  New events last 24 hours / Subjective: Patient seen in the emergency department with wife at bedside.  He states that his heart rate has been going up as high as 230 associated with difficulty catching his breath.  This will last less than 30 seconds and return back into the 110 range.  States that he has been urinating a lot, edema of the lower extremity has improved.  Assessment & Plan:   Principal Problem:   Shortness of breath Active Problems:   Renal cancer ([place])   Metastasis to lung (HCC)   Benign essential HTN   Symptomatic anemia   Pulmonary embolus, left (HCC)   Metastasis from malignant tumor of kidney (HCC)   Acute GI bleeding   Acute blood loss anemia   Anasarca   Acute hypoxemic respiratory failure with left pleural effusion, concern for  new onset heart failure -SpO2 89% on room air, currently requiring 4L O2 -BNP 341.5 -PCCM consulted for thoracentesis -Continue IV Lasix -Low-sodium, fluid restriction diet -Strict I's and O's, daily weight -Norvasc on hold due to edema  Addendum: Echocardiogram showing EF 40-45%, global hypokinesis, grade 1 diastolic dysfunction, new since previous echo in 2016. Cardiology consulted   Symptomatic anemia with acute blood loss -Xarelto on hold -Status post 1 unit packed red blood cell 2/10 -Initially had plans for video capsule study as an outpatient -GI following  Metastatic RCC to lung and bone status post radical nephrectomy and XRT -Oncology following -Cancer related pain on dilaudid and morphine intrathecal pump, continue  PE -Xarelto on hold  Demand ischemia -Secondary to #1 -Troponin trended 55 --> 29  AKI -Baseline creatinine 1.2-1.4 -Creatinine improving, continue to monitor closely while on IV Lasix     DVT prophylaxis: Xarelto on hold due to anemia SCDs Start: 01/31/21 0849  Code Status: Full code Family Communication: Wife at bedside Disposition Plan:  Status is: Inpatient  Remains inpatient appropriate because:Hemodynamically unstable, Ongoing diagnostic testing needed not appropriate for outpatient work up, IV treatments appropriate due to intensity of illness or inability to take PO and Inpatient level of care appropriate due to severity of illness   Dispo: The patient is from: Home              Anticipated d/c is to: Home              Anticipated d/c date  is: 3 days              Patient currently is not medically stable to d/c.   Difficult to place patient No      Consultants:   Oncology  GI  PCCM   Antimicrobials:  Anti-infectives (From admission, onward)   None       Objective: Vitals:   02/01/21 0118 02/01/21 0615 02/01/21 0737 02/01/21 0930  BP: 138/90 (!) 141/97 (!) 141/89 121/89  Pulse: (!) 101 99 92 (!) 105  Resp: (!)  24 18 (!) 22 18  Temp: (!) 97.3 F (36.3 C) 97.6 F (36.4 C)    TempSrc: Oral Oral    SpO2: 96% 98% 100% 92%    Intake/Output Summary (Last 24 hours) at 02/01/2021 1223 Last data filed at 01/31/2021 1401 Gross per 24 hour  Intake 335 ml  Output --  Net 335 ml   There were no vitals filed for this visit.  Examination:  General exam: Appears calm and comfortable  Respiratory system: Diminished breath sounds bilaterally without wheeze or rhonchi.  Respiratory effort normal. No respiratory distress. No conversational dyspnea.  Cardiovascular system: S1 & S2 heard, tachycardic, regular rhythm. No murmurs. + Bilateral pitting pedal edema. Gastrointestinal system: Abdomen is nondistended, soft and nontender. Normal bowel sounds heard. Central nervous system: Alert and oriented. No focal neurological deficits. Speech clear.  Extremities: Symmetric in appearance  Skin: No rashes, lesions or ulcers on exposed skin  Psychiatry: Judgement and insight appear normal. Mood & affect appropriate.   Data Reviewed: I have personally reviewed following labs and imaging studies  CBC: Recent Labs  Lab 01/31/21 0635 01/31/21 1745 02/01/21 0459  WBC 6.8  --  5.4  NEUTROABS 5.4  --  3.5  HGB 6.1* 7.4* 7.8*  HCT 21.6* 27.1* 26.5*  MCV 113.7*  --  109.1*  PLT 178  --  202   Basic Metabolic Panel: Recent Labs  Lab 01/31/21 0606 02/01/21 0459  NA 139 140  K 5.0 4.3  CL 104 104  CO2 26 27  GLUCOSE 125* 111*  BUN 34* 32*  CREATININE 1.80* 1.63*  CALCIUM 8.2* 8.4*   GFR: Estimated Creatinine Clearance: 62.6 mL/min (A) (by C-G formula based on SCr of 1.63 mg/dL (H)). Liver Function Tests: Recent Labs  Lab 01/31/21 0606 02/01/21 0459  AST 14* 16  ALT 13 13  ALKPHOS 63 66  BILITOT 0.6 0.6  PROT 5.5* 6.2*  ALBUMIN 2.2* 2.4*   No results for input(s): LIPASE, AMYLASE in the last 168 hours. No results for input(s): AMMONIA in the last 168 hours. Coagulation Profile: No results for  input(s): INR, PROTIME in the last 168 hours. Cardiac Enzymes: No results for input(s): CKTOTAL, CKMB, CKMBINDEX, TROPONINI in the last 168 hours. BNP (last 3 results) No results for input(s): PROBNP in the last 8760 hours. HbA1C: No results for input(s): HGBA1C in the last 72 hours. CBG: No results for input(s): GLUCAP in the last 168 hours. Lipid Profile: No results for input(s): CHOL, HDL, LDLCALC, TRIG, CHOLHDL, LDLDIRECT in the last 72 hours. Thyroid Function Tests: No results for input(s): TSH, T4TOTAL, FREET4, T3FREE, THYROIDAB in the last 72 hours. Anemia Panel: No results for input(s): VITAMINB12, FOLATE, FERRITIN, TIBC, IRON, RETICCTPCT in the last 72 hours. Sepsis Labs: No results for input(s): PROCALCITON, LATICACIDVEN in the last 168 hours.  Recent Results (from the past 240 hour(s))  Resp Panel by RT-PCR (Flu A&B, Covid) Nasopharyngeal Swab     Status: None  Collection Time: 01/31/21  5:47 AM   Specimen: Nasopharyngeal Swab; Nasopharyngeal(NP) swabs in vial transport medium  Result Value Ref Range Status   SARS Coronavirus 2 by RT PCR NEGATIVE NEGATIVE Final    Comment: (NOTE) SARS-CoV-2 target nucleic acids are NOT DETECTED.  The SARS-CoV-2 RNA is generally detectable in upper respiratory specimens during the acute phase of infection. The lowest concentration of SARS-CoV-2 viral copies this assay can detect is 138 copies/mL. A negative result does not preclude SARS-Cov-2 infection and should not be used as the sole basis for treatment or other patient management decisions. A negative result may occur with  improper specimen collection/handling, submission of specimen other than nasopharyngeal swab, presence of viral mutation(s) within the areas targeted by this assay, and inadequate number of viral copies(<138 copies/mL). A negative result must be combined with clinical observations, patient history, and epidemiological information. The expected result is  Negative.  Fact Sheet for Patients:  EntrepreneurPulse.com.au  Fact Sheet for Healthcare Providers:  IncredibleEmployment.be  This test is no t yet approved or cleared by the [place] FDA and  has been authorized for detection and/or diagnosis of SARS-CoV-2 by FDA under an Emergency Use Authorization (EUA). This EUA will remain  in effect (meaning this test can be used) for the duration of the COVID-19 declaration under Section 564(b)(1) of the Act, 21 U.S.C.section 360bbb-3(b)(1), unless the authorization is terminated  or revoked sooner.       Influenza A by PCR NEGATIVE NEGATIVE Final   Influenza B by PCR NEGATIVE NEGATIVE Final    Comment: (NOTE) The Xpert Xpress SARS-CoV-2/FLU/RSV plus assay is intended as an aid in the diagnosis of influenza from Nasopharyngeal swab specimens and should not be used as a sole basis for treatment. Nasal washings and aspirates are unacceptable for Xpert Xpress SARS-CoV-2/FLU/RSV testing.  Fact Sheet for Patients: EntrepreneurPulse.com.au  Fact Sheet for Healthcare Providers: IncredibleEmployment.be  This test is not yet approved or cleared by the [place] FDA and has been authorized for detection and/or diagnosis of SARS-CoV-2 by FDA under an Emergency Use Authorization (EUA). This EUA will remain in effect (meaning this test can be used) for the duration of the COVID-19 declaration under Section 564(b)(1) of the Act, 21 U.S.C. section 360bbb-3(b)(1), unless the authorization is terminated or revoked.  Performed at [facility], [place] [address], [place], [place] [address]       Radiology Studies: CT ABDOMEN PELVIS W WO CONTRAST  Result Date: 01/31/2021 CLINICAL DATA:  Restaging metastatic left renal cancer, prior nephrectomy. Discontinuation of oral chemotherapy. Shortness of breath. EXAM: CT CHEST WITH CONTRAST CT ABDOMEN AND PELVIS  WITH AND WITHOUT CONTRAST TECHNIQUE: Multidetector CT imaging of the chest was performed during intravenous contrast administration. Multidetector CT imaging of the abdomen and pelvis was performed following the standard protocol before and during bolus administration of intravenous contrast. CONTRAST:  95mL OMNIPAQUE IOHEXOL 300 MG/ML  SOLN COMPARISON:  11/20/2020 FINDINGS: CT CHEST FINDINGS Cardiovascular: No significant thoracic atherosclerosis. Please note that today's exam was not performed as a CT angiogram and is not considered highly sensitive for pulmonary embolus. Stable small pericardial effusion. Mediastinum/Nodes: 1.3 by 0.9 cm hypodense nodule anteriorly in the right thyroid lobe on image 6 of series 12. Not clinically significant; no follow-up imaging recommended (ref: J Am Coll Radiol. 2015 Feb;12(2): 143-50). There is background stranding in the mediastinum which matches the background stranding in the subcutaneous tissues and likely reflects third spacing of fluid. Lungs/Pleura: Predominantly left lower lobe perihilar  mass measures 4.3 by 3.4 cm on image 50 of series 12, previously 3.1 by 3.2 cm by my measurements. Right middle lobe and right upper lobe peribronchovascular mass measures 2.4 cm along its short axis, previously 2.1 cm. Stable thick bandlike scarring with volume loss in the right upper lobe. Underlying emphysema. Scattered scarring in the lungs. There is some hazy ground-glass opacity in the lungs which is increased from prior and could be a subtle manifestation of edema or third spacing of fluid. Moderate to large left pleural effusion has increased from prior. Small right pleural effusion appear stable. Musculoskeletal: Thoracic spondylosis. Stable small anterior sclerotic lesion in the T2 vertebral body. Anomalous medial left second rib. Observe 0 catheter noted terminating at about the T4 level posteriorly. CT ABDOMEN AND PELVIS FINDINGS Hepatobiliary: No well delineated hepatic  lesions are observed. Gallbladder unremarkable. Pancreas: Dorsal pancreatic duct dilatation extending to a pancreatic body mass with enhancing portion of this mass measuring 3.6 by 2.0 cm on image 41 of series 7, previously 3.6 by 1.9 cm. Along the anterior margin of the pancreatic head, a 3.1 by 2.3 cm mass is present and previously measured 2.8 by 2.4 cm. The pancreatic lesions are roughly stable. Questionable third small pancreatic nodule along the pancreatic head on image 54 of series 7 measuring 0.9 cm in diameter, stable. Possible fourth pancreatic nodule along the anterior portion of the pancreatic tail measuring 1.0 cm in diameter, stable. Spleen: Unremarkable Adrenals/Urinary Tract: Left nephrectomy. Adrenal glands unremarkable. The right kidney appears unremarkable. No urinary tract calculi are observed. Urinary bladder unremarkable. Stomach/Bowel: No dilated bowel. Similar appearance of mild diffuse rectal wall thickening compared to previous, possibly incidental. Vascular/Lymphatic: Aortoiliac atherosclerotic vascular disease. No pathologic adenopathy. Reproductive: Unremarkable Other: Diffuse subcutaneous and mesenteric edema suggesting third spacing of fluid. Trace free pelvic fluid, improved from prior. Diffuse stranding in the perirectal space. Musculoskeletal: Left lower abdominal wall infusion pump. Necks chloride akin lucent lesion of the right sacral ala probably representing a metastatic lesion and not appreciably changed from prior. Mild sclerosis along the SI joints. Right proximal femur ORIF, with a lucency in the right proximal femur shown on image 76 of series 13 and also noted on prior exam. Heterotopic ossification above the greater trochanter. Sharply defined lytic lesion of the right inferior pubic ramus measuring 2.4 by 1.3 cm on image 217 of series 12 with sclerotic margination, previously 2.2 by 1.3 cm. IMPRESSION: 1. Mild enlargement of the left lower lobe perihilar mass, and of the  right middle lobe/right upper lobe peribronchovascular mass. Increased left pleural effusion, now moderate to large. Stable small right pleural effusion. Stable small pericardial effusion. 2. Diffuse subcutaneous, mesenteric, and mediastinal edema compatible with third spacing of fluid. There is some hazy density in the lungs which also may be due to noncardiogenic edema. 3. Stable appearance of multiple pancreatic masses. 4. Stable osseous manifestations of metastatic disease, with the largest lesion in the right sacrum. 5. Emphysema and aortic atherosclerosis. Aortic Atherosclerosis (ICD10-I70.0) and Emphysema (ICD10-J43.9). Electronically Signed   By: Van Clines M.D.   On: 01/31/2021 10:56   DG Chest 2 View  Result Date: 01/31/2021 CLINICAL DATA:  Shortness of breath.  Metastatic renal cell cancer. EXAM: CHEST - 2 VIEW COMPARISON:  06/30/2020 FINDINGS: Interval progression of spiculated nodular opacity in the suprahilar right lung with progressive right infrahilar opacity in this patient with known metastatic renal cell carcinoma. New left base collapse/consolidation with small left pleural effusion. Tiny right pleural effusion is similar  to prior. The cardio pericardial silhouette is enlarged. The visualized bony structures of the thorax show no acute abnormality. IMPRESSION: 1. Interval progression of right suprahilar and right infrahilar pulmonary metastatic disease. 2. New left base collapse/consolidation with small left pleural effusion. Findings may be related to metastatic involvement or pneumonia. Electronically Signed   By: Misty Stanley M.D.   On: 01/31/2021 06:24   CT CHEST W CONTRAST  Result Date: 01/31/2021 CLINICAL DATA:  Restaging metastatic left renal cancer, prior nephrectomy. Discontinuation of oral chemotherapy. Shortness of breath. EXAM: CT CHEST WITH CONTRAST CT ABDOMEN AND PELVIS WITH AND WITHOUT CONTRAST TECHNIQUE: Multidetector CT imaging of the chest was performed during  intravenous contrast administration. Multidetector CT imaging of the abdomen and pelvis was performed following the standard protocol before and during bolus administration of intravenous contrast. CONTRAST:  61mL OMNIPAQUE IOHEXOL 300 MG/ML  SOLN COMPARISON:  11/20/2020 FINDINGS: CT CHEST FINDINGS Cardiovascular: No significant thoracic atherosclerosis. Please note that today's exam was not performed as a CT angiogram and is not considered highly sensitive for pulmonary embolus. Stable small pericardial effusion. Mediastinum/Nodes: 1.3 by 0.9 cm hypodense nodule anteriorly in the right thyroid lobe on image 6 of series 12. Not clinically significant; no follow-up imaging recommended (ref: J Am Coll Radiol. 2015 Feb;12(2): 143-50). There is background stranding in the mediastinum which matches the background stranding in the subcutaneous tissues and likely reflects third spacing of fluid. Lungs/Pleura: Predominantly left lower lobe perihilar mass measures 4.3 by 3.4 cm on image 50 of series 12, previously 3.1 by 3.2 cm by my measurements. Right middle lobe and right upper lobe peribronchovascular mass measures 2.4 cm along its short axis, previously 2.1 cm. Stable thick bandlike scarring with volume loss in the right upper lobe. Underlying emphysema. Scattered scarring in the lungs. There is some hazy ground-glass opacity in the lungs which is increased from prior and could be a subtle manifestation of edema or third spacing of fluid. Moderate to large left pleural effusion has increased from prior. Small right pleural effusion appear stable. Musculoskeletal: Thoracic spondylosis. Stable small anterior sclerotic lesion in the T2 vertebral body. Anomalous medial left second rib. Observe 0 catheter noted terminating at about the T4 level posteriorly. CT ABDOMEN AND PELVIS FINDINGS Hepatobiliary: No well delineated hepatic lesions are observed. Gallbladder unremarkable. Pancreas: Dorsal pancreatic duct dilatation  extending to a pancreatic body mass with enhancing portion of this mass measuring 3.6 by 2.0 cm on image 41 of series 7, previously 3.6 by 1.9 cm. Along the anterior margin of the pancreatic head, a 3.1 by 2.3 cm mass is present and previously measured 2.8 by 2.4 cm. The pancreatic lesions are roughly stable. Questionable third small pancreatic nodule along the pancreatic head on image 54 of series 7 measuring 0.9 cm in diameter, stable. Possible fourth pancreatic nodule along the anterior portion of the pancreatic tail measuring 1.0 cm in diameter, stable. Spleen: Unremarkable Adrenals/Urinary Tract: Left nephrectomy. Adrenal glands unremarkable. The right kidney appears unremarkable. No urinary tract calculi are observed. Urinary bladder unremarkable. Stomach/Bowel: No dilated bowel. Similar appearance of mild diffuse rectal wall thickening compared to previous, possibly incidental. Vascular/Lymphatic: Aortoiliac atherosclerotic vascular disease. No pathologic adenopathy. Reproductive: Unremarkable Other: Diffuse subcutaneous and mesenteric edema suggesting third spacing of fluid. Trace free pelvic fluid, improved from prior. Diffuse stranding in the perirectal space. Musculoskeletal: Left lower abdominal wall infusion pump. Necks chloride akin lucent lesion of the right sacral ala probably representing a metastatic lesion and not appreciably changed from prior. Mild  sclerosis along the SI joints. Right proximal femur ORIF, with a lucency in the right proximal femur shown on image 76 of series 13 and also noted on prior exam. Heterotopic ossification above the greater trochanter. Sharply defined lytic lesion of the right inferior pubic ramus measuring 2.4 by 1.3 cm on image 217 of series 12 with sclerotic margination, previously 2.2 by 1.3 cm. IMPRESSION: 1. Mild enlargement of the left lower lobe perihilar mass, and of the right middle lobe/right upper lobe peribronchovascular mass. Increased left pleural  effusion, now moderate to large. Stable small right pleural effusion. Stable small pericardial effusion. 2. Diffuse subcutaneous, mesenteric, and mediastinal edema compatible with third spacing of fluid. There is some hazy density in the lungs which also may be due to noncardiogenic edema. 3. Stable appearance of multiple pancreatic masses. 4. Stable osseous manifestations of metastatic disease, with the largest lesion in the right sacrum. 5. Emphysema and aortic atherosclerosis. Aortic Atherosclerosis (ICD10-I70.0) and Emphysema (ICD10-J43.9). Electronically Signed   By: Van Clines M.D.   On: 01/31/2021 10:56      Scheduled Meds: . furosemide  40 mg Intravenous BID  . pantoprazole  40 mg Oral Daily  . sodium chloride flush  3 mL Intravenous Q12H   Continuous Infusions: [name] Morphine INTRATHECAL pump       LOS: 1 day      Time spent: 35 minutes   Dessa Phi, DO [facility] 02/01/2021, 12:23 PM   Available via Epic secure chat 7am-7pm After these hours, please refer to coverage provider listed on [email or web address]

## 2021-02-02 DIAGNOSIS — I5021 Acute systolic (congestive) heart failure: Secondary | ICD-10-CM | POA: Diagnosis not present

## 2021-02-02 DIAGNOSIS — R0602 Shortness of breath: Secondary | ICD-10-CM | POA: Diagnosis not present

## 2021-02-02 LAB — CBC
HCT: 23.6 % — ABNORMAL LOW (ref 39.0–52.0)
Hemoglobin: 6.7 g/dL — CL (ref 13.0–17.0)
MCH: 31.2 pg (ref 26.0–34.0)
MCHC: 28.4 g/dL — ABNORMAL LOW (ref 30.0–36.0)
MCV: 109.8 fL — ABNORMAL HIGH (ref 80.0–100.0)
Platelets: 146 10*3/uL — ABNORMAL LOW (ref 150–400)
RBC: 2.15 MIL/uL — ABNORMAL LOW (ref 4.22–5.81)
RDW: 21.2 % — ABNORMAL HIGH (ref 11.5–15.5)
WBC: 4.9 10*3/uL (ref 4.0–10.5)
nRBC: 0 % (ref 0.0–0.2)

## 2021-02-02 LAB — BASIC METABOLIC PANEL
Anion gap: 9 (ref 5–15)
BUN: 30 mg/dL — ABNORMAL HIGH (ref 6–20)
CO2: 28 mmol/L (ref 22–32)
Calcium: 8 mg/dL — ABNORMAL LOW (ref 8.9–10.3)
Chloride: 103 mmol/L (ref 98–111)
Creatinine, Ser: 1.56 mg/dL — ABNORMAL HIGH (ref 0.61–1.24)
GFR, Estimated: 53 mL/min — ABNORMAL LOW (ref >60)
Glucose, Bld: 109 mg/dL — ABNORMAL HIGH (ref 70–99)
Potassium: 4.2 mmol/L (ref 3.5–5.1)
Sodium: 140 mmol/L (ref 135–145)

## 2021-02-02 LAB — TSH: TSH: 8.89 u[IU]/mL — ABNORMAL HIGH (ref 0.350–4.500)

## 2021-02-02 LAB — PREPARE RBC (CROSSMATCH)

## 2021-02-02 LAB — LACTATE DEHYDROGENASE: LDH: 146 U/L (ref 98–192)

## 2021-02-02 LAB — T4, FREE: Free T4: 0.93 ng/dL (ref 0.61–1.12)

## 2021-02-02 MED ORDER — SPIRONOLACTONE 25 MG PO TABS
25.0000 mg | ORAL_TABLET | Freq: Every day | ORAL | Status: DC
  Administered 2021-02-02 – 2021-02-06 (×5): 25 mg via ORAL
  Filled 2021-02-02 (×5): qty 1

## 2021-02-02 MED ORDER — LOSARTAN POTASSIUM 25 MG PO TABS
25.0000 mg | ORAL_TABLET | Freq: Every day | ORAL | Status: DC
  Administered 2021-02-02 – 2021-02-06 (×5): 25 mg via ORAL
  Filled 2021-02-02 (×5): qty 1

## 2021-02-02 MED ORDER — SODIUM CHLORIDE 0.9% IV SOLUTION: Freq: Once | INTRAVENOUS | Status: AC

## 2021-02-02 NOTE — Plan of Care (Signed)
  Problem: Activity: Goal: Risk for activity intolerance will decrease Outcome: Completed/Met

## 2021-02-02 NOTE — Consult Note (Addendum)
Cardiology Consultation:   Patient ID: Keith [name] MRN: [id number]; DOB: May 29, 1966  Admit date: 01/31/2021 Date of Consult: 02/02/2021  PCP:  [name], [place]  Cardiologist:  No primary care provider on file.  Advanced Practice Provider:  No care team member to display Electrophysiologist:  None   Patient Profile:   Keith [name] is a 55 y.o. male with a hx of stage IV clear cell RCC, recent GI bleeding, and prior PTE who is being seen today for the evaluation of CHF at the request of Dr Maylene Roes.  History of Present Illness:   Keith [name] reports progressive edema, orthopnea, and SOB over the past two weeks.  Though he has had minor edema previously, he feels that current anasarca has occurred rather quickly. He reports being hospitalized in late 2021 with GI bleed and profound anemia.  He returned 01/22/21 with Hb 6.1. He reports receiving several units of PRBCs.  He feels that his CHF has presented subsequently. He reports  + edema, orthopnea, and PND.  He underwent thoracentesis yesterday.  Echo has been obtained which revealed EF 40%.   He has been on Cabometyx for a long time.  This was recently discontinued.   He denies CP, arm/jaw pain or ischemic symptoms.   Past Medical History:  Diagnosis Date  . Diabetes mellitus without complication ([place])    diet controlled only  . GI bleed   . left renal ca d'd 2008  . 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) 2012  . 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 Metastasis to bone [facility]) 2014/2016    Past Surgical History:  Procedure Laterality Date  . BIOPSY  08/28/2019   Procedure: BIOPSY;  Surgeon: Thornton Park, MD;  Location: [facility];  Service: Gastroenterology;;  . BIOPSY  03/01/2020   Procedure: BIOPSY;  Surgeon: Mauri Pole, MD;  Location: [facility];  Service: Endoscopy;;  . BIOPSY  12/19/2020   Procedure: BIOPSY;  Surgeon: Yetta Flock, MD;  Location: [facility];   Service: Gastroenterology;;  . BIOPSY  12/20/2020   Procedure: BIOPSY;  Surgeon: Gatha Mayer, MD;  Location: [facility];  Service: Endoscopy;;  . COLONOSCOPY W/ POLYPECTOMY    . COLONOSCOPY WITH PROPOFOL N/A 03/01/2020   Procedure: COLONOSCOPY WITH PROPOFOL;  Surgeon: Mauri Pole, MD;  [facility]: [facility];  Service: Endoscopy;  Laterality: N/A;  . COLONOSCOPY WITH PROPOFOL N/A 12/20/2020   Procedure: COLONOSCOPY WITH PROPOFOL;  Surgeon: Gatha Mayer, MD;  [facility];  Service: Endoscopy;  Laterality: N/A;  . ESOPHAGOGASTRODUODENOSCOPY (EGD) WITH PROPOFOL N/A 08/28/2019   Procedure: ESOPHAGOGASTRODUODENOSCOPY (EGD) WITH PROPOFOL;  Surgeon: Thornton Park, MD;  [facility];  Service: Gastroenterology;  Laterality: N/A;  . ESOPHAGOGASTRODUODENOSCOPY (EGD) WITH PROPOFOL N/A 03/01/2020   Procedure: ESOPHAGOGASTRODUODENOSCOPY (EGD) WITH PROPOFOL;  Surgeon: Mauri Pole, MD;  [facility];  Service: Endoscopy;  Laterality: N/A;  . ESOPHAGOGASTRODUODENOSCOPY (EGD) WITH PROPOFOL N/A 12/19/2020   Procedure: ESOPHAGOGASTRODUODENOSCOPY (EGD) WITH PROPOFOL;  Surgeon: Yetta Flock, MD;  [facility];  Service: Gastroenterology;  Laterality: N/A;  . FEMUR IM NAIL Right 02/16/2018   Procedure: INTRAMEDULLARY (IM) NAIL RIGHT FEMORAL;  Surgeon: Paralee Cancel, MD;  Location: [facility];  Service: Orthopedics;  Laterality: Right;  . GIVENS CAPSULE STUDY N/A 03/03/2020   Procedure: GIVENS CAPSULE STUDY;  Surgeon: Mauri Pole, MD;  [facility]: [facility];  Service: Endoscopy;  Laterality: N/A;  . HAND SURGERY Right   .  HEMOSTASIS CLIP PLACEMENT  12/20/2020   Procedure: HEMOSTASIS CLIP PLACEMENT;  Surgeon: Gatha Mayer, MD;  [facility]: [facility];  Service: Endoscopy;;  . HOT HEMOSTASIS N/A 12/20/2020   Procedure: HOT HEMOSTASIS (ARGON PLASMA COAGULATION/BICAP);  Surgeon: Gatha Mayer, MD;  Location: [facility];  Service:  Endoscopy;  Laterality: N/A;  . IR ANGIOGRAM EXTREMITY RIGHT  02/15/2018  . IR ANGIOGRAM SELECTIVE EACH ADDITIONAL VESSEL  02/15/2018  . IR ANGIOGRAM SELECTIVE EACH ADDITIONAL VESSEL  02/15/2018  . IR ANGIOGRAM SELECTIVE EACH ADDITIONAL VESSEL  02/15/2018  . IR ANGIOGRAM SELECTIVE EACH ADDITIONAL VESSEL  02/15/2018  . IR EMBO TUMOR ORGAN ISCHEMIA INFARCT INC GUIDE ROADMAPPING  02/15/2018  . IR EMBO TUMOR ORGAN ISCHEMIA INFARCT INC GUIDE ROADMAPPING  02/15/2018  . IR US GUIDE VASC ACCESS LEFT  02/15/2018  . LUNG REMOVAL, PARTIAL Right 09/2008  . NEPHRECTOMY RADICAL     Left   . PAIN PUMP IMPLANTATION N/A 05/16/2015   Procedure: Intrathecal pain pump placement;  Surgeon: Clydell Hakim, MD;  Location: [facility];  Service: Neurosurgery;  Laterality: N/A;  Intrathecal pain pump placement       Inpatient Medications: Scheduled Meds: . sodium chloride   Intravenous Once  . furosemide  40 mg Intravenous BID  . pantoprazole  40 mg Oral Daily  . sodium chloride flush  3 mL Intravenous Q12H   Continuous Infusions: [name] Morphine INTRATHECAL pump     PRN Meds: acetaminophen, ALPRAZolam, HYDROmorphone, metoprolol tartrate, ondansetron **OR** ondansetron (ZOFRAN) IV  Allergies:    Allergies  Allergen Reactions  . Ceftriaxone Hives  . Hydrocodone Swelling    Social History:   Social History   Socioeconomic History  . Marital status: Married    Spouse name: Not on file  . Number of children: 2  . Years of education: Not on file  . Highest education level: Not on file  Occupational History  . Occupation: Clinical biochemist   Tobacco Use  . Smoking status: Current Every Day Smoker    Packs/day: 0.50    Years: 30.00    Pack years: 15.00    Types: Cigarettes  . Smokeless tobacco: Never Used  Vaping Use  . Vaping Use: Never used  Substance and Sexual Activity  . Alcohol use: No  . Drug use: Yes    Types: Marijuana    Comment: occasional. Last used: last night.   . Sexual activity: Not  Currently  Other Topics Concern  . Not on file  Social History Narrative   6 caffeine drinks daily    08-11-18  Unable to ask abuse questions wife with him today.   Social Determinants of Health   Financial Resource Strain: Not on file  Food Insecurity: Not on file  Transportation Needs: Not on file  Physical Activity: Not on file  Stress: Not on file  Social Connections: Not on file  Intimate Partner Violence: Not on file    Family History:    Family History  Problem Relation Age of Onset  . Hypertension Mother   . Diabetes Brother   . Irritable bowel syndrome Sister   . Cancer Maternal Aunt        ovarian  . Colon cancer Neg Hx      ROS:  Please see the history of present illness.   All other ROS reviewed and negative.     Physical Exam/Data:   Vitals:   02/02/21 0157 02/02/21 0438 02/02/21 0440 02/02/21 0748  BP: 124/83 112/78    Pulse: 95  93    Resp: 18 18    Temp: 98.3 F (36.8 C) 98.4 F (36.9 C)    TempSrc: Oral Oral    SpO2: (!) 89% 100%    Weight:   81.7 kg 81.7 kg  Height:    _0  (1.905 m)    Intake/Output Summary (Last 24 hours) at 02/02/2021 0820 Last data filed at 02/02/2021 0441 Gross per 24 hour  Intake 240 ml  Output 1200 ml  Net -960 ml   Last 3 Weights 02/02/2021 02/02/2021 01/24/2021  Weight (lbs) 180 lb 1.9 oz 180 lb 1.9 oz 191 lb  Weight (kg) 81.7 kg 81.7 kg 86.637 kg  Some encounter information is confidential and restricted. Go to Review Flowsheets activity to see all data.     Body mass index is 22.51 kg/m.  General:  Well nourished, well developed, in no acute distress HEENT: normal Neck: +JVD Endocrine:  No thryomegaly Cardiac:  normal S1, S2; RRR; no murmur  Lungs:  Decreased BS at bases Abd: soft,  Ext: + edema with anasarca Musculoskeletal:  No deformities, BUE and BLE strength normal and equal Skin: warm and dry  Neuro:  CNs 2-12 intact, no focal abnormalities noted Psych:  Normal affect   EKG:  The EKG was  personally reviewed and demonstrates:  Sinus rhythm, no ischemic symptoms Telemetry:  Telemetry was personally reviewed and demonstrates:  sinus  Relevant CV Studies: Echo- EF 40%, mild LVH, small pericardial effusion, no tamponade physiology, large pleural effusion, dilated IVC  Laboratory Data:  High Sensitivity Troponin:   Recent Labs  Lab 01/31/21 0621 01/31/21 0920  TROPONINIHS 55* 29*     Chemistry Recent Labs  Lab 01/31/21 0606 02/01/21 0459 02/02/21 0600  NA 139 140 140  K 5.0 4.3 4.2  CL 104 104 103  CO2 _1 GLUCOSE 125* 111* 109*  BUN 34* 32* 30*  CREATININE 1.80* 1.63* 1.56*  CALCIUM 8.2* 8.4* 8.0*  GFRNONAA 44* 50* 53*  ANIONGAP _2 Recent Labs  Lab 01/31/21 0606 02/01/21 0459  PROT 5.5* 6.2*  ALBUMIN 2.2* 2.4*  AST 14* 16  ALT 13 13  ALKPHOS 63 66  BILITOT 0.6 0.6   Hematology Recent Labs  Lab 01/31/21 0635 01/31/21 1745 02/01/21 0459 02/02/21 0600  WBC 6.8  --  5.4 4.9  RBC 1.90*  --  2.43* 2.15*  HGB 6.1* 7.4* 7.8* 6.7*  HCT 21.6* 27.1* 26.5* 23.6*  MCV 113.7*  --  109.1* 109.8*  MCH 32.1  --  32.1 31.2  MCHC 28.2*  --  29.4* 28.4*  RDW 22.4*  --  22.3* 21.2*  PLT 178  --  160 146*   BNP Recent Labs  Lab 01/31/21 0606  BNP 341.5*    DDimer  Recent Labs  Lab 01/31/21 0606  DDIMER 2.80*     Radiology/Studies:  CT ABDOMEN PELVIS W WO CONTRAST  Result Date: 01/31/2021 CLINICAL DATA:  Restaging metastatic left renal cancer, prior nephrectomy. Discontinuation of oral chemotherapy. Shortness of breath. EXAM: CT CHEST WITH CONTRAST CT ABDOMEN AND PELVIS WITH AND WITHOUT CONTRAST TECHNIQUE: Multidetector CT imaging of the chest was performed during intravenous contrast administration. Multidetector CT imaging of the abdomen and pelvis was performed following the standard protocol before and during bolus administration of intravenous contrast. CONTRAST:  18m OMNIPAQUE IOHEXOL 300 MG/ML  SOLN COMPARISON:  11/20/2020  FINDINGS: CT CHEST FINDINGS Cardiovascular: No significant thoracic atherosclerosis. Please note that today's exam was not  performed as a CT angiogram and is not considered highly sensitive for pulmonary embolus. Stable small pericardial effusion. Mediastinum/Nodes: 1.3 by 0.9 cm hypodense nodule anteriorly in the right thyroid lobe on image 6 of series 12. Not clinically significant; no follow-up imaging recommended (ref: J Am Coll Radiol. 2015 Feb;12(2): 143-50). There is background stranding in the mediastinum which matches the background stranding in the subcutaneous tissues and likely reflects third spacing of fluid. Lungs/Pleura: Predominantly left lower lobe perihilar mass measures 4.3 by 3.4 cm on image 50 of series 12, previously 3.1 by 3.2 cm by my measurements. Right middle lobe and right upper lobe peribronchovascular mass measures 2.4 cm along its short axis, previously 2.1 cm. Stable thick bandlike scarring with volume loss in the right upper lobe. Underlying emphysema. Scattered scarring in the lungs. There is some hazy ground-glass opacity in the lungs which is increased from prior and could be a subtle manifestation of edema or third spacing of fluid. Moderate to large left pleural effusion has increased from prior. Small right pleural effusion appear stable. Musculoskeletal: Thoracic spondylosis. Stable small anterior sclerotic lesion in the T2 vertebral body. Anomalous medial left second rib. Observe 0 catheter noted terminating at about the T4 level posteriorly. CT ABDOMEN AND PELVIS FINDINGS Hepatobiliary: No well delineated hepatic lesions are observed. Gallbladder unremarkable. Pancreas: Dorsal pancreatic duct dilatation extending to a pancreatic body mass with enhancing portion of this mass measuring 3.6 by 2.0 cm on image 41 of series 7, previously 3.6 by 1.9 cm. Along the anterior margin of the pancreatic head, a 3.1 by 2.3 cm mass is present and previously measured 2.8 by 2.4 cm. The  pancreatic lesions are roughly stable. Questionable third small pancreatic nodule along the pancreatic head on image 54 of series 7 measuring 0.9 cm in diameter, stable. Possible fourth pancreatic nodule along the anterior portion of the pancreatic tail measuring 1.0 cm in diameter, stable. Spleen: Unremarkable Adrenals/Urinary Tract: Left nephrectomy. Adrenal glands unremarkable. The right kidney appears unremarkable. No urinary tract calculi are observed. Urinary bladder unremarkable. Stomach/Bowel: No dilated bowel. Similar appearance of mild diffuse rectal wall thickening compared to previous, possibly incidental. Vascular/Lymphatic: Aortoiliac atherosclerotic vascular disease. No pathologic adenopathy. Reproductive: Unremarkable Other: Diffuse subcutaneous and mesenteric edema suggesting third spacing of fluid. Trace free pelvic fluid, improved from prior. Diffuse stranding in the perirectal space. Musculoskeletal: Left lower abdominal wall infusion pump. Necks chloride akin lucent lesion of the right sacral ala probably representing a metastatic lesion and not appreciably changed from prior. Mild sclerosis along the SI joints. Right proximal femur ORIF, with a lucency in the right proximal femur shown on image 76 of series 13 and also noted on prior exam. Heterotopic ossification above the greater trochanter. Sharply defined lytic lesion of the right inferior pubic ramus measuring 2.4 by 1.3 cm on image 217 of series 12 with sclerotic margination, previously 2.2 by 1.3 cm. IMPRESSION: 1. Mild enlargement of the left lower lobe perihilar mass, and of the right middle lobe/right upper lobe peribronchovascular mass. Increased left pleural effusion, now moderate to large. Stable small right pleural effusion. Stable small pericardial effusion. 2. Diffuse subcutaneous, mesenteric, and mediastinal edema compatible with third spacing of fluid. There is some hazy density in the lungs which also may be due to  noncardiogenic edema. 3. Stable appearance of multiple pancreatic masses. 4. Stable osseous manifestations of metastatic disease, with the largest lesion in the right sacrum. 5. Emphysema and aortic atherosclerosis. Aortic Atherosclerosis (ICD10-I70.0) and Emphysema (ICD10-J43.9). Electronically Signed  By: Van Clines M.D.   On: 01/31/2021 10:56   DG Chest 2 View  Result Date: 01/31/2021 CLINICAL DATA:  Shortness of breath.  Metastatic renal cell cancer. EXAM: CHEST - 2 VIEW COMPARISON:  06/30/2020 FINDINGS: Interval progression of spiculated nodular opacity in the suprahilar right lung with progressive right infrahilar opacity in this patient with known metastatic renal cell carcinoma. New left base collapse/consolidation with small left pleural effusion. Tiny right pleural effusion is similar to prior. The cardio pericardial silhouette is enlarged. The visualized bony structures of the thorax show no acute abnormality. IMPRESSION: 1. Interval progression of right suprahilar and right infrahilar pulmonary metastatic disease. 2. New left base collapse/consolidation with small left pleural effusion. Findings may be related to metastatic involvement or pneumonia. Electronically Signed   By: Misty Stanley M.D.   On: 01/31/2021 06:24   CT CHEST W CONTRAST  Result Date: 01/31/2021 CLINICAL DATA:  Restaging metastatic left renal cancer, prior nephrectomy. Discontinuation of oral chemotherapy. Shortness of breath. EXAM: CT CHEST WITH CONTRAST CT ABDOMEN AND PELVIS WITH AND WITHOUT CONTRAST TECHNIQUE: Multidetector CT imaging of the chest was performed during intravenous contrast administration. Multidetector CT imaging of the abdomen and pelvis was performed following the standard protocol before and during bolus administration of intravenous contrast. CONTRAST:  32m OMNIPAQUE IOHEXOL 300 MG/ML  SOLN COMPARISON:  11/20/2020 FINDINGS: CT CHEST FINDINGS Cardiovascular: No significant thoracic  atherosclerosis. Please note that today's exam was not performed as a CT angiogram and is not considered highly sensitive for pulmonary embolus. Stable small pericardial effusion. Mediastinum/Nodes: 1.3 by 0.9 cm hypodense nodule anteriorly in the right thyroid lobe on image 6 of series 12. Not clinically significant; no follow-up imaging recommended (ref: J Am Coll Radiol. 2015 Feb;12(2): 143-50). There is background stranding in the mediastinum which matches the background stranding in the subcutaneous tissues and likely reflects third spacing of fluid. Lungs/Pleura: Predominantly left lower lobe perihilar mass measures 4.3 by 3.4 cm on image 50 of series 12, previously 3.1 by 3.2 cm by my measurements. Right middle lobe and right upper lobe peribronchovascular mass measures 2.4 cm along its short axis, previously 2.1 cm. Stable thick bandlike scarring with volume loss in the right upper lobe. Underlying emphysema. Scattered scarring in the lungs. There is some hazy ground-glass opacity in the lungs which is increased from prior and could be a subtle manifestation of edema or third spacing of fluid. Moderate to large left pleural effusion has increased from prior. Small right pleural effusion appear stable. Musculoskeletal: Thoracic spondylosis. Stable small anterior sclerotic lesion in the T2 vertebral body. Anomalous medial left second rib. Observe 0 catheter noted terminating at about the T4 level posteriorly. CT ABDOMEN AND PELVIS FINDINGS Hepatobiliary: No well delineated hepatic lesions are observed. Gallbladder unremarkable. Pancreas: Dorsal pancreatic duct dilatation extending to a pancreatic body mass with enhancing portion of this mass measuring 3.6 by 2.0 cm on image 41 of series 7, previously 3.6 by 1.9 cm. Along the anterior margin of the pancreatic head, a 3.1 by 2.3 cm mass is present and previously measured 2.8 by 2.4 cm. The pancreatic lesions are roughly stable. Questionable third small  pancreatic nodule along the pancreatic head on image 54 of series 7 measuring 0.9 cm in diameter, stable. Possible fourth pancreatic nodule along the anterior portion of the pancreatic tail measuring 1.0 cm in diameter, stable. Spleen: Unremarkable Adrenals/Urinary Tract: Left nephrectomy. Adrenal glands unremarkable. The right kidney appears unremarkable. No urinary tract calculi are observed. Urinary bladder unremarkable.  Stomach/Bowel: No dilated bowel. Similar appearance of mild diffuse rectal wall thickening compared to previous, possibly incidental. Vascular/Lymphatic: Aortoiliac atherosclerotic vascular disease. No pathologic adenopathy. Reproductive: Unremarkable Other: Diffuse subcutaneous and mesenteric edema suggesting third spacing of fluid. Trace free pelvic fluid, improved from prior. Diffuse stranding in the perirectal space. Musculoskeletal: Left lower abdominal wall infusion pump. Necks chloride akin lucent lesion of the right sacral ala probably representing a metastatic lesion and not appreciably changed from prior. Mild sclerosis along the SI joints. Right proximal femur ORIF, with a lucency in the right proximal femur shown on image 76 of series 13 and also noted on prior exam. Heterotopic ossification above the greater trochanter. Sharply defined lytic lesion of the right inferior pubic ramus measuring 2.4 by 1.3 cm on image 217 of series 12 with sclerotic margination, previously 2.2 by 1.3 cm. IMPRESSION: 1. Mild enlargement of the left lower lobe perihilar mass, and of the right middle lobe/right upper lobe peribronchovascular mass. Increased left pleural effusion, now moderate to large. Stable small right pleural effusion. Stable small pericardial effusion. 2. Diffuse subcutaneous, mesenteric, and mediastinal edema compatible with third spacing of fluid. There is some hazy density in the lungs which also may be due to noncardiogenic edema. 3. Stable appearance of multiple pancreatic masses.  4. Stable osseous manifestations of metastatic disease, with the largest lesion in the right sacrum. 5. Emphysema and aortic atherosclerosis. Aortic Atherosclerosis (ICD10-I70.0) and Emphysema (ICD10-J43.9). Electronically Signed   By: Van Clines M.D.   On: 01/31/2021 10:56   DG CHEST PORT 1 VIEW  Result Date: 02/01/2021 CLINICAL DATA:  Status post left thoracentesis. EXAM: PORTABLE CHEST 1 VIEW COMPARISON:  CT chest and chest radiograph from January 31, 2021. FINDINGS: Right suprahilar and left infrahilar metastatic disease, better characterized on recent CT chest. Interval left thoracentesis with decreased size of a layering small left pleural effusion. Hazy left basilar opacity. No visible pneumothorax on this semi erect radiograph. Similar small right pleural effusion. Similar enlarged cardiac silhouette. IMPRESSION: 1. Interval left thoracentesis with decreased layering small left pleural effusion. Hazy left basilar opacity likely is secondary to layering pleural effusion and atelectasis. No visible pneumothorax on this semi erect radiograph. 2. Similar small right pleural effusion. 3. Right suprahilar and left infrahilar metastatic disease, better characterized on recent CT chest. Electronically Signed   By: Margaretha Sheffield MD   On: 02/01/2021 13:01   ECHOCARDIOGRAM COMPLETE  Result Date: 02/01/2021    ECHOCARDIOGRAM REPORT   Patient Name:   KALEEM SARTWELL Starling Date of Exam: 02/01/2021 Medical Rec #:  [id number]       Height:       75.0 in Accession #:    [id number]      Weight:       191.0 lb Date of Birth:  [birth year]       BSA:          2.152 m Patient Age:    80 years        BP:           127/67 mmHg Patient Gender: M               HR:           99 bpm. Exam Location:  Inpatient Procedure: 2D Echo, Cardiac Doppler and Color Doppler Indications:    Dyspnea R06.00  History:        Patient has prior history of Echocardiogram examinations, most  recent 11/07/2015. Risk  Factors:Diabetes. Cancer.  Sonographer:    Jonelle Sidle Dance Referring Phys: [id number] [place]  1. Left ventricular ejection fraction, by estimation, is 40 to 45%. The left ventricle has mild to moderately decreased function. The left ventricle demonstrates global hypokinesis. There is mild left ventricular hypertrophy. Left ventricular diastolic parameters are consistent with Grade I diastolic dysfunction (impaired relaxation).  2. Right ventricular systolic function is normal. The right ventricular size is normal. Tricuspid regurgitation signal is inadequate for assessing PA pressure.  3. Mild diastolic collapse.  4. A small pericardial effusion is present. The pericardial effusion is circumferential. There is no evidence of cardiac tamponade. Large pleural effusion in both left and right lateral regions.  5. The mitral valve is grossly normal. Trivial mitral valve regurgitation.  6. The aortic valve is tricuspid. Aortic valve regurgitation is not visualized.  7. The inferior vena cava is dilated in size with <50% respiratory variability, suggesting right atrial pressure of 15 mmHg. FINDINGS  Left Ventricle: Left ventricular ejection fraction, by estimation, is 40 to 45%. The left ventricle has mild to moderately decreased function. The left ventricle demonstrates global hypokinesis. The left ventricular internal cavity size was normal in size. There is mild left ventricular hypertrophy. Left ventricular diastolic parameters are consistent with Grade I diastolic dysfunction (impaired relaxation). Indeterminate filling pressures. Right Ventricle: The right ventricular size is normal. No increase in right ventricular wall thickness. Right ventricular systolic function is normal. Tricuspid regurgitation signal is inadequate for assessing PA pressure. Left Atrium: Left atrial size was normal in size. Right Atrium: Mild diastolic collapse. Right atrial size was normal in size. Pericardium: A small  pericardial effusion is present. The pericardial effusion is circumferential. There is diastolic collapse of the right atrial wall. There is no evidence of cardiac tamponade. Mitral Valve: The mitral valve is grossly normal. Trivial mitral valve regurgitation. Tricuspid Valve: The tricuspid valve is grossly normal. Tricuspid valve regurgitation is trivial. Aortic Valve: The aortic valve is tricuspid. Aortic valve regurgitation is not visualized. Pulmonic Valve: The pulmonic valve was not well visualized. Pulmonic valve regurgitation is not visualized. Aorta: The aortic root and ascending aorta are structurally normal, with no evidence of dilitation. Venous: The inferior vena cava is dilated in size with less than 50% respiratory variability, suggesting right atrial pressure of 15 mmHg. IAS/Shunts: No atrial level shunt detected by color flow Doppler. Additional Comments: There is a large pleural effusion in both left and right lateral regions.  LEFT VENTRICLE PLAX 2D LVIDd:         5.00 cm  Diastology LVIDs:         4.44 cm  LV e' medial:    6.47 cm/s LV PW:         1.10 cm  LV E/e' medial:  12.0 LV IVS:        0.72 cm  LV e' lateral:   7.71 cm/s LVOT diam:     2.10 cm  LV E/e' lateral: 10.1 LV SV:         52 LV SV Index:   24 LVOT Area:     3.46 cm  RIGHT VENTRICLE             IVC RV Basal diam:  2.66 cm     IVC diam: 2.31 cm RV S prime:     15.90 cm/s TAPSE (M-mode): 2.1 cm LEFT ATRIUM         Index LA diam:    3.00 cm  1.39 cm/m  AORTIC VALVE LVOT Vmax:   99.30 cm/s LVOT Vmean:  64.500 cm/s LVOT VTI:    0.151 m  AORTA Ao Root diam: 3.70 cm Ao Asc diam:  3.40 cm MITRAL VALVE MV Area (PHT): 3.08 cm    SHUNTS MV Decel Time: 246 msec    Systemic VTI:  0.15 m MV E velocity: 77.70 cm/s  Systemic Diam: 2.10 cm MV A velocity: 98.00 cm/s MV E/A ratio:  0.79 Lyman Bishop MD Electronically signed by Lyman Bishop MD Signature Date/Time: 02/01/2021/12:52:38 PM    Final      Assessment and Plan:   1. Acute systolic  dysfunction The patient presents with anasarca.  This is likely multifactorial.  He has low albumen, recent multiple PRBCs, and acute renal failure.  In addition, he has EF 40%.  It could be that he has fallen off of the Starling curve and that reduced EF is secondary.  It could also be that he has primary heart failure. He does not have ischemic symptoms and is currently not a candidate for invasive cardiology testing given ongoing/ unstable anemia.  His Cabometyx has been discontinued.  On my review this medicine is associated with CHF (primarily in low numbers- case reports).  As I cannot exclude other causes, we will consider other secondary causes of CHF also as we initiate therapy.  Recs: 1. Continue IV diuresis, strict Is and Os, daily weights 2. Start losartan 23m daily 3. As CHF improves, we will add coreg 4. Start low dose spironolactone  Once CHF has been treated and anemia is resolved, we could consider eventual elective outpatient risk stratification.  Cardiology to follow with you.  For questions or updates, please contact CPue[place] del CarmenPlease consult [email or web address] for contact info under    Signed, JThompson Grayer MD  02/02/2021 8:20 AM

## 2021-02-02 NOTE — Progress Notes (Signed)
CRITICAL VALUE ALERT  Critical Value:  Hgb 6.7  Date & Time Notied: 02/02/21;0701  Provider Notified:Yes  Orders Received/Actions taken:Awaiting new orders

## 2021-02-02 NOTE — Progress Notes (Signed)
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  He admits to dyspnea worse with exertion, associated with dry cough and scrotal swelling as well as orthopnea.  He was seen by his oncologist as an outpatient on 01/22/2021 and was prescribed Lasix for generalized anasarca and his cabozantinib was on hold due to his poor health.  Patient has not had much improvement with Lasix.  He was also evaluated by [facility] on 01/24/2021 with plans for outpatient video capsule study due to GI bleeding.  Patient was admitted for acute hypoxic respiratory failure, anasarca, symptomatic anemia.  He was given 1 unit packed red blood cell and IV Lasix.  PCCM, oncology, GI consulted.  Patient underwent bedside thoracentesis 2/11 with 500 cc fluid removal.  New events last 24 hours / Subjective: Patient states that he is feeling much better, swelling has improved and his breathing has improved as well.  On room air this morning without new complaints.  He denies any melena or hematochezia or other blood loss.  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 (HCC)   Acute GI bleeding   Acute blood loss anemia   Anasarca   Pleural effusion   Acute hypoxemic respiratory failure with left pleural effusion, concern for new onset heart  failure -SpO2 89% on room air, and initially requiring 4L O2 -BNP 341.5 -Status post thoracentesis with 500 cc fluid removal -Norvasc on hold due to edema -Now on room air  Acute combined systolic and diastolic CHF -Echocardiogram showing EF 40-45%, global hypokinesis, grade 1 diastolic dysfunction, new since previous echo in 2016 -Cardiology following  -Continue IV Lasix -Started on losartan, spironolactone -Low-sodium, fluid restriction diet -Strict I's and O's, daily weight  Symptomatic anemia with acute blood loss -Status post 1 unit packed red blood cell 2/10 -Initially had plans for video capsule study as an outpatient -Appreciate GI.  Recommended to stop Xarelto indefinitely.  If patient has rebleeding off Xarelto, then continue GI work-up.  GI has signed off at this point.  No overt bleeding -Hemoglobin trended downward again.  Transfuse 1 additional unit packed red blood cell 2/12  Metastatic RCC to lung and bone status post radical nephrectomy and XRT -Appreciate oncology -Cancer related pain on dilaudid and morphine intrathecal pump, continue  PE -Xarelto on hold indefinitely  Demand ischemia -Secondary to #1 -Troponin trended 55 --> 29  AKI -Baseline creatinine 1.2-1.4 -Creatinine improving, continue to monitor closely while on IV Lasix     DVT prophylaxis: SCDs Start: 01/31/21 0849  Code Status: Full code Family Communication: No family at bedside Disposition Plan:  Status is: Inpatient  Remains inpatient appropriate because:IV treatments appropriate due to intensity of illness or inability to take PO and Inpatient level of care appropriate due to severity of illness   Dispo: The patient is from: Home  Anticipated d/c is to: Home              Anticipated d/c date is: 2 days              Patient currently is not medically stable to d/c.  Continue IV diuresis   Difficult to place patient No      Consultants:    Oncology  GI  PCCM  Cardiology   Antimicrobials:  Anti-infectives (From admission, onward)   None       Objective: Vitals:   02/02/21 0157 02/02/21 0438 02/02/21 0440 02/02/21 0748  BP: 124/83 112/78    Pulse: 95 93    Resp: 18 18    Temp: 98.3 F (36.8 C) 98.4 F (36.9 C)    TempSrc: Oral Oral    SpO2: (!) 89% 100%    Weight:   81.7 kg 81.7 kg  Height:    6\' 3"  (1.905 m)    Intake/Output Summary (Last 24 hours) at 02/02/2021 1039 Last data filed at 02/02/2021 0900 Gross per 24 hour  Intake 480 ml  Output 1200 ml  Net -720 ml   Filed Weights   02/02/21 0440 02/02/21 0748  Weight: 81.7 kg 81.7 kg    Examination: General exam: Appears calm and comfortable  Respiratory system: Clear to auscultation bilaterally. Respiratory effort normal.  On room air Cardiovascular system: S1 & S2 heard, RRR.  Bilateral pitting pedal edema. Gastrointestinal system: Abdomen is nondistended, soft and nontender. Normal bowel sounds heard. Central nervous system: Alert and oriented. Non focal exam. Speech clear  Extremities: Symmetric in appearance bilaterally  Skin: No rashes, lesions or ulcers on exposed skin  Psychiatry: Judgement and insight appear stable. Mood & affect appropriate.    Data Reviewed: I have personally reviewed following labs and imaging studies  CBC: Recent Labs  Lab 01/31/21 0635 01/31/21 1745 02/01/21 0459 02/02/21 0600  WBC 6.8  --  5.4 4.9  NEUTROABS 5.4  --  3.5  --   HGB 6.1* 7.4* 7.8* 6.7*  HCT 21.6* 27.1* 26.5* 23.6*  MCV 113.7*  --  109.1* 109.8*  PLT 178  --  160 341*   Basic Metabolic Panel: Recent Labs  Lab 01/31/21 0606 02/01/21 0459 02/02/21 0600  NA 139 140 140  K 5.0 4.3 4.2  CL 104 104 103  CO2 26 27 28   GLUCOSE 125* 111* 109*  BUN 34* 32* 30*  CREATININE 1.80* 1.63* 1.56*  CALCIUM 8.2* 8.4* 8.0*   GFR: Estimated Creatinine Clearance: 63.3 mL/min (A) (by C-G formula based on SCr of 1.56 mg/dL (H)). Liver Function  Tests: Recent Labs  Lab 01/31/21 0606 02/01/21 0459  AST 14* 16  ALT 13 13  ALKPHOS 63 66  BILITOT 0.6 0.6  PROT 5.5* 6.2*  ALBUMIN 2.2* 2.4*   No results for input(s): LIPASE, AMYLASE in the last 168 hours. No results for input(s): AMMONIA in the last 168 hours. Coagulation Profile: No results for input(s): INR, PROTIME in the last 168 hours. Cardiac Enzymes: No results for input(s): CKTOTAL, CKMB, CKMBINDEX, TROPONINI in the last 168 hours. BNP (last 3 results) No results for input(s): PROBNP in the last 8760 hours. HbA1C: No results for input(s): HGBA1C in the last 72 hours. CBG: No results for input(s): GLUCAP in the last 168 hours. Lipid Profile: No results for input(s): CHOL, HDL, LDLCALC, TRIG, CHOLHDL, LDLDIRECT in the last 72 hours. Thyroid Function Tests: Recent Labs    02/02/21 0844  TSH 8.890*   Anemia  Panel: No results for input(s): VITAMINB12, FOLATE, FERRITIN, TIBC, IRON, RETICCTPCT in the last 72 hours. Sepsis Labs: No results for input(s): PROCALCITON, LATICACIDVEN in the last 168 hours.  Recent Results (from the past 240 hour(s))  Resp Panel by RT-PCR (Flu A&B, Covid) Nasopharyngeal Swab     Status: None   Collection Time: 01/31/21  5:47 AM   Specimen: Nasopharyngeal Swab; Nasopharyngeal(NP) swabs in vial transport medium  Result Value Ref Range Status   SARS Coronavirus 2 by RT PCR NEGATIVE NEGATIVE Final    Comment: (NOTE) SARS-CoV-2 target nucleic acids are NOT DETECTED.  The SARS-CoV-2 RNA is generally detectable in upper respiratory specimens during the acute phase of infection. The lowest concentration of SARS-CoV-2 viral copies this assay can detect is 138 copies/mL. A negative result does not preclude SARS-Cov-2 infection and should not be used as the sole basis for treatment or other patient management decisions. A negative result may occur with  improper specimen collection/handling, submission of specimen other than nasopharyngeal  swab, presence of viral mutation(s) within the areas targeted by this assay, and inadequate number of viral copies(<138 copies/mL). A negative result must be combined with clinical observations, patient history, and epidemiological information. The expected result is Negative.  Fact Sheet for Patients:  EntrepreneurPulse.com.au  Fact Sheet for Healthcare Providers:  IncredibleEmployment.be  This test is no t yet approved or cleared by the [place] FDA and  has been authorized for detection and/or diagnosis of SARS-CoV-2 by FDA under an Emergency Use Authorization (EUA). This EUA will remain  in effect (meaning this test can be used) for the duration of the COVID-19 declaration under Section 564(b)(1) of the Act, 21 U.S.C.section 360bbb-3(b)(1), unless the authorization is terminated  or revoked sooner.       Influenza A by PCR NEGATIVE NEGATIVE Final   Influenza B by PCR NEGATIVE NEGATIVE Final    Comment: (NOTE) The Xpert Xpress SARS-CoV-2/FLU/RSV plus assay is intended as an aid in the diagnosis of influenza from Nasopharyngeal swab specimens and should not be used as a sole basis for treatment. Nasal washings and aspirates are unacceptable for Xpert Xpress SARS-CoV-2/FLU/RSV testing.  Fact Sheet for Patients: EntrepreneurPulse.com.au  Fact Sheet for Healthcare Providers: IncredibleEmployment.be  This test is not yet approved or cleared by the [place] FDA and has been authorized for detection and/or diagnosis of SARS-CoV-2 by FDA under an Emergency Use Authorization (EUA). This EUA will remain in effect (meaning this test can be used) for the duration of the COVID-19 declaration under Section 564(b)(1) of the Act, 21 U.S.C. section 360bbb-3(b)(1), unless the authorization is terminated or revoked.  Performed at [facility], [place] [address] [place], [place] [address]    Body fluid culture (includes gram stain)     Status: None (Preliminary result)   Collection Time: 02/01/21 12:24 PM   Specimen: Pleural Fluid  Result Value Ref Range Status   Specimen Description   Final    PLEURAL LT Performed at [facility] [address] [place], [place] [address]    Special Requests   Final    NONE Performed at [facility], [place] [address], [place], [place] [address]    Gram Stain   Final    MODERATE WBC PRESENT,BOTH PMN AND MONONUCLEAR NO ORGANISMS SEEN Performed at [facility], [place] [address], [place], [place] [address]    Culture PENDING  Incomplete   Report Status PENDING  Incomplete      Radiology  Studies: DG CHEST PORT 1 VIEW  Result Date: 02/01/2021 CLINICAL DATA:  Status post left thoracentesis. EXAM: PORTABLE CHEST 1 VIEW COMPARISON:  CT chest and chest radiograph from January 31, 2021. FINDINGS: Right suprahilar and left infrahilar metastatic disease, better characterized on recent CT chest. Interval left thoracentesis with decreased size of a layering small left pleural effusion. Hazy left basilar opacity. No visible pneumothorax on this semi erect radiograph. Similar small right pleural effusion. Similar enlarged cardiac silhouette. IMPRESSION: 1. Interval left thoracentesis with decreased layering small left pleural effusion. Hazy left basilar opacity likely is secondary to layering pleural effusion and atelectasis. No visible pneumothorax on this semi erect radiograph. 2. Similar small right pleural effusion. 3. Right suprahilar and left infrahilar metastatic disease, better characterized on recent CT chest. Electronically Signed   By: Margaretha Sheffield MD   On: 02/01/2021 13:01   ECHOCARDIOGRAM COMPLETE  Result Date: 02/01/2021    ECHOCARDIOGRAM REPORT   Patient Name:   TYRAN HUSER Abe Date of Exam: 02/01/2021 Medical Rec #:  [id number]       Height:       75.0 in Accession #:    [id number]      Weight:        191.0 lb Date of Birth:  [birth year]       BSA:          2.152 m Patient Age:    54 years        BP:           127/67 mmHg Patient Gender: M               HR:           99 bpm. Exam Location:  Inpatient Procedure: 2D Echo, Cardiac Doppler and Color Doppler Indications:    Dyspnea R06.00  History:        Patient has prior history of Echocardiogram examinations, most                 recent 11/07/2015. Risk Factors:Diabetes. Cancer.  Sonographer:    Jonelle Sidle Dance Referring Phys: [id number] [place]  1. Left ventricular ejection fraction, by estimation, is 40 to 45%. The left ventricle has mild to moderately decreased function. The left ventricle demonstrates global hypokinesis. There is mild left ventricular hypertrophy. Left ventricular diastolic parameters are consistent with Grade I diastolic dysfunction (impaired relaxation).  2. Right ventricular systolic function is normal. The right ventricular size is normal. Tricuspid regurgitation signal is inadequate for assessing PA pressure.  3. Mild diastolic collapse.  4. A small pericardial effusion is present. The pericardial effusion is circumferential. There is no evidence of cardiac tamponade. Large pleural effusion in both left and right lateral regions.  5. The mitral valve is grossly normal. Trivial mitral valve regurgitation.  6. The aortic valve is tricuspid. Aortic valve regurgitation is not visualized.  7. The inferior vena cava is dilated in size with <50% respiratory variability, suggesting right atrial pressure of 15 mmHg. FINDINGS  Left Ventricle: Left ventricular ejection fraction, by estimation, is 40 to 45%. The left ventricle has mild to moderately decreased function. The left ventricle demonstrates global hypokinesis. The left ventricular internal cavity size was normal in size. There is mild left ventricular hypertrophy. Left ventricular diastolic parameters are consistent with Grade I diastolic dysfunction (impaired relaxation).  Indeterminate filling pressures. Right Ventricle: The right ventricular size is normal. No increase in right ventricular wall thickness. Right ventricular systolic function is normal. Tricuspid regurgitation  signal is inadequate for assessing PA pressure. Left Atrium: Left atrial size was normal in size. Right Atrium: Mild diastolic collapse. Right atrial size was normal in size. Pericardium: A small pericardial effusion is present. The pericardial effusion is circumferential. There is diastolic collapse of the right atrial wall. There is no evidence of cardiac tamponade. Mitral Valve: The mitral valve is grossly normal. Trivial mitral valve regurgitation. Tricuspid Valve: The tricuspid valve is grossly normal. Tricuspid valve regurgitation is trivial. Aortic Valve: The aortic valve is tricuspid. Aortic valve regurgitation is not visualized. Pulmonic Valve: The pulmonic valve was not well visualized. Pulmonic valve regurgitation is not visualized. Aorta: The aortic root and ascending aorta are structurally normal, with no evidence of dilitation. Venous: The inferior vena cava is dilated in size with less than 50% respiratory variability, suggesting right atrial pressure of 15 mmHg. IAS/Shunts: No atrial level shunt detected by color flow Doppler. Additional Comments: There is a large pleural effusion in both left and right lateral regions.  LEFT VENTRICLE PLAX 2D LVIDd:         5.00 cm  Diastology LVIDs:         4.44 cm  LV e' medial:    6.47 cm/s LV PW:         1.10 cm  LV E/e' medial:  12.0 LV IVS:        0.72 cm  LV e' lateral:   7.71 cm/s LVOT diam:     2.10 cm  LV E/e' lateral: 10.1 LV SV:         52 LV SV Index:   24 LVOT Area:     3.46 cm  RIGHT VENTRICLE             IVC RV Basal diam:  2.66 cm     IVC diam: 2.31 cm RV S prime:     15.90 cm/s TAPSE (M-mode): 2.1 cm LEFT ATRIUM         Index LA diam:    3.00 cm 1.39 cm/m  AORTIC VALVE LVOT Vmax:   99.30 cm/s LVOT Vmean:  64.500 cm/s LVOT VTI:    0.151 m   AORTA Ao Root diam: 3.70 cm Ao Asc diam:  3.40 cm MITRAL VALVE MV Area (PHT): 3.08 cm    SHUNTS MV Decel Time: 246 msec    Systemic VTI:  0.15 m MV E velocity: 77.70 cm/s  Systemic Diam: 2.10 cm MV A velocity: 98.00 cm/s MV E/A ratio:  0.79 Lyman Bishop MD Electronically signed by Lyman Bishop MD Signature Date/Time: 02/01/2021/12:52:38 PM    Final       Scheduled Meds: . furosemide  40 mg Intravenous BID  . losartan  25 mg Oral Daily  . pantoprazole  40 mg Oral Daily  . sodium chloride flush  3 mL Intravenous Q12H  . spironolactone  25 mg Oral Daily   Continuous Infusions: [name] Morphine INTRATHECAL pump       LOS: 2 days      Time spent: 20 minutes   Dessa Phi, DO [facility] 02/02/2021, 10:39 AM   Available via Epic secure chat 7am-7pm After these hours, please refer to coverage provider listed on [email or web address]

## 2021-02-03 DIAGNOSIS — I5021 Acute systolic (congestive) heart failure: Secondary | ICD-10-CM | POA: Diagnosis not present

## 2021-02-03 DIAGNOSIS — R0602 Shortness of breath: Secondary | ICD-10-CM | POA: Diagnosis not present

## 2021-02-03 LAB — TYPE AND SCREEN
ABO/RH(D): A NEG
Antibody Screen: NEGATIVE
Unit division: 0
Unit division: 0

## 2021-02-03 LAB — BASIC METABOLIC PANEL
Anion gap: 8 (ref 5–15)
BUN: 27 mg/dL — ABNORMAL HIGH (ref 6–20)
CO2: 29 mmol/L (ref 22–32)
Calcium: 7.8 mg/dL — ABNORMAL LOW (ref 8.9–10.3)
Chloride: 104 mmol/L (ref 98–111)
Creatinine, Ser: 1.6 mg/dL — ABNORMAL HIGH (ref 0.61–1.24)
GFR, Estimated: 51 mL/min — ABNORMAL LOW (ref >60)
Glucose, Bld: 109 mg/dL — ABNORMAL HIGH (ref 70–99)
Potassium: 4.5 mmol/L (ref 3.5–5.1)
Sodium: 141 mmol/L (ref 135–145)

## 2021-02-03 LAB — BPAM RBC
Blood Product Expiration Date: 202203072359
Blood Product Expiration Date: 202203082359
ISSUE DATE / TIME: 202202100945
ISSUE DATE / TIME: 202202121255
Unit Type and Rh: 600
Unit Type and Rh: 600

## 2021-02-03 LAB — CBC
HCT: 26.2 % — ABNORMAL LOW (ref 39.0–52.0)
Hemoglobin: 7.6 g/dL — ABNORMAL LOW (ref 13.0–17.0)
MCH: 31.7 pg (ref 26.0–34.0)
MCHC: 29 g/dL — ABNORMAL LOW (ref 30.0–36.0)
MCV: 109.2 fL — ABNORMAL HIGH (ref 80.0–100.0)
Platelets: 147 10*3/uL — ABNORMAL LOW (ref 150–400)
RBC: 2.4 MIL/uL — ABNORMAL LOW (ref 4.22–5.81)
RDW: 20.8 % — ABNORMAL HIGH (ref 11.5–15.5)
WBC: 5 10*3/uL (ref 4.0–10.5)
nRBC: 0 % (ref 0.0–0.2)

## 2021-02-03 MED ORDER — FUROSEMIDE 10 MG/ML IJ SOLN
80.0000 mg | Freq: Two times a day (BID) | INTRAMUSCULAR | Status: DC
  Administered 2021-02-03 – 2021-02-05 (×5): 80 mg via INTRAVENOUS
  Filled 2021-02-03 (×5): qty 8

## 2021-02-03 NOTE — Progress Notes (Signed)
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  He admits to dyspnea worse with exertion, associated with dry cough and scrotal swelling as well as orthopnea.  He was seen by his oncologist as an outpatient on 01/22/2021 and was prescribed Lasix for generalized anasarca and his cabozantinib was on hold due to his poor health.  Patient has not had much improvement with Lasix.  He was also evaluated by [facility] on 01/24/2021 with plans for outpatient video capsule study due to GI bleeding.  Patient was admitted for acute hypoxic respiratory failure, anasarca, symptomatic anemia.  He was given 1 unit packed red blood cell and IV Lasix.  PCCM, oncology, GI consulted.  Patient underwent bedside thoracentesis 2/11 with 500 cc fluid removal.  Echocardiogram revealed combined systolic and diastolic heart failure.  Cardiology consulted.  New events last 24 hours / Subjective: Drinking coffee this morning, oxygen off of his face.  He has no physical complaints this morning.  Continues to urinate, UOP 1656ml yesterday   Assessment & Plan:   Principal Problem:   Shortness of breath Active Problems:   Renal cancer ([place])   Metastasis to lung (HCC)   Benign essential HTN   Symptomatic anemia   Pulmonary embolus, left (HCC)   Metastasis from malignant tumor of kidney (HCC)   Acute GI bleeding   Acute blood loss anemia   Anasarca   Pleural effusion   Acute hypoxemic respiratory failure with left pleural effusion, concern for  new onset heart failure -SpO2 89% on room air, and initially requiring 4L O2 -BNP 341.5 -Status post thoracentesis with 500 cc fluid removal -Norvasc on hold due to edema -Now on room air  Acute combined systolic and diastolic CHF -Echocardiogram showing EF 40-45%, global hypokinesis, grade 1 diastolic dysfunction, new since previous echo in 2016 -Cardiology following  -Continue IV Lasix, dose increased -Started on losartan, spironolactone -Low-sodium, fluid restriction diet -Strict I's and O's, daily weight  Symptomatic anemia with acute blood loss -Status post 1 unit packed red blood cell 2/10, 1 unit 2/12 -Initially had plans for video capsule study as an outpatient -Appreciate GI.  Recommended to stop Xarelto indefinitely.  If patient has rebleeding off Xarelto, then continue GI work-up.  GI has signed off at this point.  No overt bleeding  Metastatic RCC to lung and bone status post radical nephrectomy and XRT -Appreciate oncology -Cancer related pain on dilaudid and morphine intrathecal pump, continue  PE -Xarelto on hold indefinitely  Demand ischemia -Secondary to #1 -Troponin trended 55 --> 29  AKI -Baseline creatinine 1.2-1.4 -Creatinine remains stable.  Continue to monitor while on diuretic    DVT prophylaxis: SCDs Start: 01/31/21 0849  Code Status: Full code Family Communication: No family at bedside, updated wife over the phone Disposition Plan:  Status is: Inpatient  Remains inpatient appropriate because:IV treatments appropriate due to intensity of illness or inability to take PO and Inpatient level of care appropriate due to severity of illness   Dispo: The patient is from: Home  Anticipated d/c is to: Home              Anticipated d/c date is: 2 days              Patient currently is not medically stable to d/c.  Continue IV diuresis   Difficult to place patient No      Consultants:    Oncology  GI  PCCM  Cardiology   Antimicrobials:  Anti-infectives (From admission, onward)   None       Objective: Vitals:   02/02/21 1327 02/02/21 1600 02/02/21 2124 02/03/21 0613  BP: (!) 125/27 121/82 128/89 134/88  Pulse: 79 82 86 87  Resp: 18 18 20 20   Temp: 98.4 F (36.9 C) 98 F (36.7 C) (!) 97.2 F (36.2 C) 97.7 F (36.5 C)  TempSrc: Oral Oral Oral Oral  SpO2: 97% 100% 93% 94%  Weight:      Height:        Intake/Output Summary (Last 24 hours) at 02/03/2021 1053 Last data filed at 02/03/2021 0700 Gross per 24 hour  Intake 318 ml  Output 1650 ml  Net -1332 ml   Filed Weights   02/02/21 0440 02/02/21 0748  Weight: 81.7 kg 81.7 kg    Examination: General exam: Appears calm and comfortable  Respiratory system: Clear to auscultation. Respiratory effort normal.  On room air without any respiratory distress.  No conversational dyspnea. Cardiovascular system: S1 & S2 heard, RRR.  Bilateral pitting pedal edema. Gastrointestinal system: Abdomen is nondistended, soft and nontender. Normal bowel sounds heard. Central nervous system: Alert and oriented. Non focal exam. Speech clear  Extremities: Symmetric in appearance bilaterally  Skin: No rashes, lesions or ulcers on exposed skin  Psychiatry: Judgement and insight appear stable. Mood & affect appropriate.    Data Reviewed: I have personally reviewed following labs and imaging studies  CBC: Recent Labs  Lab 01/31/21 0635 01/31/21 1745 02/01/21 0459 02/02/21 0600 02/03/21 0536  WBC 6.8  --  5.4 4.9 5.0  NEUTROABS 5.4  --  3.5  --   --   HGB 6.1* 7.4* 7.8* 6.7* 7.6*  HCT 21.6* 27.1* 26.5* 23.6* 26.2*  MCV 113.7*  --  109.1* 109.8* 109.2*  PLT 178  --  160 146* 174*   Basic Metabolic Panel: Recent Labs  Lab 01/31/21 0606 02/01/21 0459 02/02/21 0600 02/03/21 0536  NA 139 140 140 141  K 5.0 4.3 4.2 4.5  CL 104 104 103 104  CO2 26 27 28 29   GLUCOSE 125* 111* 109* 109*  BUN 34* 32* 30* 27*   CREATININE 1.80* 1.63* 1.56* 1.60*  CALCIUM 8.2* 8.4* 8.0* 7.8*   GFR: Estimated Creatinine Clearance: 61.7 mL/min (A) (by C-G formula based on SCr of 1.6 mg/dL (H)). Liver Function Tests: Recent Labs  Lab 01/31/21 0606 02/01/21 0459  AST 14* 16  ALT 13 13  ALKPHOS 63 66  BILITOT 0.6 0.6  PROT 5.5* 6.2*  ALBUMIN 2.2* 2.4*   No results for input(s): LIPASE, AMYLASE in the last 168 hours. No results for input(s): AMMONIA in the last 168 hours. Coagulation Profile: No results for input(s): INR, PROTIME in the last 168 hours. Cardiac Enzymes: No results for input(s): CKTOTAL, CKMB, CKMBINDEX, TROPONINI in the last 168 hours. BNP (last 3 results) No results for input(s): PROBNP in the last 8760 hours. HbA1C: No results for input(s): HGBA1C in the last 72 hours. CBG: No results for input(s): GLUCAP in the last 168 hours. Lipid Profile: No results  for input(s): CHOL, HDL, LDLCALC, TRIG, CHOLHDL, LDLDIRECT in the last 72 hours. Thyroid Function Tests: Recent Labs    02/02/21 0840 02/02/21 0844  TSH  --  8.890*  FREET4 0.93  --    Anemia Panel: No results for input(s): VITAMINB12, FOLATE, FERRITIN, TIBC, IRON, RETICCTPCT in the last 72 hours. Sepsis Labs: No results for input(s): PROCALCITON, LATICACIDVEN in the last 168 hours.  Recent Results (from the past 240 hour(s))  Resp Panel by RT-PCR (Flu A&B, Covid) Nasopharyngeal Swab     Status: None   Collection Time: 01/31/21  5:47 AM   Specimen: Nasopharyngeal Swab; Nasopharyngeal(NP) swabs in vial transport medium  Result Value Ref Range Status   SARS Coronavirus 2 by RT PCR NEGATIVE NEGATIVE Final    Comment: (NOTE) SARS-CoV-2 target nucleic acids are NOT DETECTED.  The SARS-CoV-2 RNA is generally detectable in upper respiratory specimens during the acute phase of infection. The lowest concentration of SARS-CoV-2 viral copies this assay can detect is 138 copies/mL. A negative result does not preclude  SARS-Cov-2 infection and should not be used as the sole basis for treatment or other patient management decisions. A negative result may occur with  improper specimen collection/handling, submission of specimen other than nasopharyngeal swab, presence of viral mutation(s) within the areas targeted by this assay, and inadequate number of viral copies(<138 copies/mL). A negative result must be combined with clinical observations, patient history, and epidemiological information. The expected result is Negative.  Fact Sheet for Patients:  EntrepreneurPulse.com.au  Fact Sheet for Healthcare Providers:  IncredibleEmployment.be  This test is no t yet approved or cleared by the [place] FDA and  has been authorized for detection and/or diagnosis of SARS-CoV-2 by FDA under an Emergency Use Authorization (EUA). This EUA will remain  in effect (meaning this test can be used) for the duration of the COVID-19 declaration under Section 564(b)(1) of the Act, 21 U.S.C.section 360bbb-3(b)(1), unless the authorization is terminated  or revoked sooner.       Influenza A by PCR NEGATIVE NEGATIVE Final   Influenza B by PCR NEGATIVE NEGATIVE Final    Comment: (NOTE) The Xpert Xpress SARS-CoV-2/FLU/RSV plus assay is intended as an aid in the diagnosis of influenza from Nasopharyngeal swab specimens and should not be used as a sole basis for treatment. Nasal washings and aspirates are unacceptable for Xpert Xpress SARS-CoV-2/FLU/RSV testing.  Fact Sheet for Patients: EntrepreneurPulse.com.au  Fact Sheet for Healthcare Providers: IncredibleEmployment.be  This test is not yet approved or cleared by the [place] FDA and has been authorized for detection and/or diagnosis of SARS-CoV-2 by FDA under an Emergency Use Authorization (EUA). This EUA will remain in effect (meaning this test can be used) for the duration of  the COVID-19 declaration under Section 564(b)(1) of the Act, 21 U.S.C. section 360bbb-3(b)(1), unless the authorization is terminated or revoked.  Performed at [facility], [place] [address], [place], [place] [address]   Body fluid culture (includes gram stain)     Status: None (Preliminary result)   Collection Time: 02/01/21 12:24 PM   Specimen: Pleural Fluid  Result Value Ref Range Status   Specimen Description   Final    PLEURAL LT Performed at [place] [address], [place], [place] [address]    Special Requests   Final    NONE Performed at [facility], [place] [address] [place], [place] [address]    Gram Stain   Final    MODERATE WBC  PRESENT,BOTH PMN AND MONONUCLEAR NO ORGANISMS SEEN    Culture   Final    NO GROWTH < 24 HOURS Performed at [facility], [place] [address], [place], [place] [address]    Report Status PENDING  Incomplete      Radiology Studies: DG CHEST PORT 1 VIEW  Result Date: 02/01/2021 CLINICAL DATA:  Status post left thoracentesis. EXAM: PORTABLE CHEST 1 VIEW COMPARISON:  CT chest and chest radiograph from January 31, 2021. FINDINGS: Right suprahilar and left infrahilar metastatic disease, better characterized on recent CT chest. Interval left thoracentesis with decreased size of a layering small left pleural effusion. Hazy left basilar opacity. No visible pneumothorax on this semi erect radiograph. Similar small right pleural effusion. Similar enlarged cardiac silhouette. IMPRESSION: 1. Interval left thoracentesis with decreased layering small left pleural effusion. Hazy left basilar opacity likely is secondary to layering pleural effusion and atelectasis. No visible pneumothorax on this semi erect radiograph. 2. Similar small right pleural effusion. 3. Right suprahilar and left infrahilar metastatic disease, better characterized on recent CT chest. Electronically Signed   By: Margaretha Sheffield  MD   On: 02/01/2021 13:01   ECHOCARDIOGRAM COMPLETE  Result Date: 02/01/2021    ECHOCARDIOGRAM REPORT   Patient Name:   AVREY HYSER Hyneman Date of Exam: 02/01/2021 Medical Rec #:  [id number]       Height:       75.0 in Accession #:    [id number]      Weight:       191.0 lb Date of Birth:  11-Jan-1966       BSA:          2.152 m Patient Age:    89 years        BP:           127/67 mmHg Patient Gender: M               HR:           99 bpm. Exam Location:  Inpatient Procedure: 2D Echo, Cardiac Doppler and Color Doppler Indications:    Dyspnea R06.00  History:        Patient has prior history of Echocardiogram examinations, most                 recent 11/07/2015. Risk Factors:Diabetes. Cancer.  Sonographer:    Jonelle Sidle Dance Referring Phys: [id number] [place]  1. Left ventricular ejection fraction, by estimation, is 40 to 45%. The left ventricle has mild to moderately decreased function. The left ventricle demonstrates global hypokinesis. There is mild left ventricular hypertrophy. Left ventricular diastolic parameters are consistent with Grade I diastolic dysfunction (impaired relaxation).  2. Right ventricular systolic function is normal. The right ventricular size is normal. Tricuspid regurgitation signal is inadequate for assessing PA pressure.  3. Mild diastolic collapse.  4. A small pericardial effusion is present. The pericardial effusion is circumferential. There is no evidence of cardiac tamponade. Large pleural effusion in both left and right lateral regions.  5. The mitral valve is grossly normal. Trivial mitral valve regurgitation.  6. The aortic valve is tricuspid. Aortic valve regurgitation is not visualized.  7. The inferior vena cava is dilated in size with <50% respiratory variability, suggesting right atrial pressure of 15 mmHg. FINDINGS  Left Ventricle: Left ventricular ejection fraction, by estimation, is 40 to 45%. The left ventricle has mild to moderately decreased function. The left  ventricle demonstrates global hypokinesis. The left ventricular internal cavity size  was normal in size. There is mild left ventricular hypertrophy. Left ventricular diastolic parameters are consistent with Grade I diastolic dysfunction (impaired relaxation). Indeterminate filling pressures. Right Ventricle: The right ventricular size is normal. No increase in right ventricular wall thickness. Right ventricular systolic function is normal. Tricuspid regurgitation signal is inadequate for assessing PA pressure. Left Atrium: Left atrial size was normal in size. Right Atrium: Mild diastolic collapse. Right atrial size was normal in size. Pericardium: A small pericardial effusion is present. The pericardial effusion is circumferential. There is diastolic collapse of the right atrial wall. There is no evidence of cardiac tamponade. Mitral Valve: The mitral valve is grossly normal. Trivial mitral valve regurgitation. Tricuspid Valve: The tricuspid valve is grossly normal. Tricuspid valve regurgitation is trivial. Aortic Valve: The aortic valve is tricuspid. Aortic valve regurgitation is not visualized. Pulmonic Valve: The pulmonic valve was not well visualized. Pulmonic valve regurgitation is not visualized. Aorta: The aortic root and ascending aorta are structurally normal, with no evidence of dilitation. Venous: The inferior vena cava is dilated in size with less than 50% respiratory variability, suggesting right atrial pressure of 15 mmHg. IAS/Shunts: No atrial level shunt detected by color flow Doppler. Additional Comments: There is a large pleural effusion in both left and right lateral regions.  LEFT VENTRICLE PLAX 2D LVIDd:         5.00 cm  Diastology LVIDs:         4.44 cm  LV e' medial:    6.47 cm/s LV PW:         1.10 cm  LV E/e' medial:  12.0 LV IVS:        0.72 cm  LV e' lateral:   7.71 cm/s LVOT diam:     2.10 cm  LV E/e' lateral: 10.1 LV SV:         52 LV SV Index:   24 LVOT Area:     3.46 cm  RIGHT  VENTRICLE             IVC RV Basal diam:  2.66 cm     IVC diam: 2.31 cm RV S prime:     15.90 cm/s TAPSE (M-mode): 2.1 cm LEFT ATRIUM         Index LA diam:    3.00 cm 1.39 cm/m  AORTIC VALVE LVOT Vmax:   99.30 cm/s LVOT Vmean:  64.500 cm/s LVOT VTI:    0.151 m  AORTA Ao Root diam: 3.70 cm Ao Asc diam:  3.40 cm MITRAL VALVE MV Area (PHT): 3.08 cm    SHUNTS MV Decel Time: 246 msec    Systemic VTI:  0.15 m MV E velocity: 77.70 cm/s  Systemic Diam: 2.10 cm MV A velocity: 98.00 cm/s MV E/A ratio:  0.79 Lyman Bishop MD Electronically signed by Lyman Bishop MD Signature Date/Time: 02/01/2021/12:52:38 PM    Final       Scheduled Meds: . furosemide  80 mg Intravenous BID  . losartan  25 mg Oral Daily  . pantoprazole  40 mg Oral Daily  . sodium chloride flush  3 mL Intravenous Q12H  . spironolactone  25 mg Oral Daily   Continuous Infusions: [name] Morphine INTRATHECAL pump       LOS: 3 days      Time spent: 20 minutes   Dessa Phi, DO [facility] 02/03/2021, 10:53 AM   Available via Epic secure chat 7am-7pm After these hours, please refer to coverage provider listed on [email or web address]

## 2021-02-03 NOTE — Plan of Care (Signed)

## 2021-02-03 NOTE — Progress Notes (Signed)
Progress Note   Subjective   Doing well today, the patient denies CP or SOB.  Remains very volume overloaded.   No new concerns  Inpatient Medications    Scheduled Meds: . furosemide  40 mg Intravenous BID  . losartan  25 mg Oral Daily  . pantoprazole  40 mg Oral Daily  . sodium chloride flush  3 mL Intravenous Q12H  . spironolactone  25 mg Oral Daily   Continuous Infusions: [name] Morphine INTRATHECAL pump     PRN Meds: acetaminophen, ALPRAZolam, HYDROmorphone, metoprolol tartrate, ondansetron **OR** ondansetron (ZOFRAN) IV   Vital Signs    Vitals:   02/02/21 1327 02/02/21 1600 02/02/21 2124 02/03/21 0613  BP: (!) 125/27 121/82 128/89 134/88  Pulse: 79 82 86 87  Resp: 18 18 20 20   Temp: 98.4 F (36.9 C) 98 F (36.7 C) (!) 97.2 F (36.2 C) 97.7 F (36.5 C)  TempSrc: Oral Oral Oral Oral  SpO2: 97% 100% 93% 94%  Weight:      Height:        Intake/Output Summary (Last 24 hours) at 02/03/2021 6546 Last data filed at 02/03/2021 0700 Gross per 24 hour  Intake 558 ml  Output 1650 ml  Net -1092 ml   Filed Weights   02/02/21 0440 02/02/21 0748  Weight: 81.7 kg 81.7 kg    Telemetry    sinus - Personally Reviewed  Physical Exam   GEN- The patient is well appearing, alert and oriented x 3 today.   Head- normocephalic, atraumatic Eyes-  Sclera clear, conjunctiva pink Ears- hearing intact Oropharynx- clear Neck- supple, Lungs-  normal work of breathing Heart- Regular rate and rhythm  GI- soft  Extremities- no clubbing, cyanosis, +2 edema  MS- no significant deformity or atrophy Skin- no rash or lesion Psych- euthymic mood, full affect Neuro- strength and sensation are intact   Labs    Chemistry Recent Labs  Lab 01/31/21 0606 02/01/21 0459 02/02/21 0600 02/03/21 0536  NA 139 140 140 141  K 5.0 4.3 4.2 4.5  CL 104 104 103 104  CO2 26 27 28 29   GLUCOSE 125* 111* 109* 109*  BUN 34* 32* 30* 27*  CREATININE 1.80* 1.63* 1.56* 1.60*  CALCIUM 8.2* 8.4*  8.0* 7.8*  PROT 5.5* 6.2*  --   --   ALBUMIN 2.2* 2.4*  --   --   AST 14* 16  --   --   ALT 13 13  --   --   ALKPHOS 63 66  --   --   BILITOT 0.6 0.6  --   --   GFRNONAA 44* 50* 53* 51*  ANIONGAP 9 9 9 8      Hematology Recent Labs  Lab 02/01/21 0459 02/02/21 0600 02/03/21 0536  WBC 5.4 4.9 5.0  RBC 2.43* 2.15* 2.40*  HGB 7.8* 6.7* 7.6*  HCT 26.5* 23.6* 26.2*  MCV 109.1* 109.8* 109.2*  MCH 32.1 31.2 31.7  MCHC 29.4* 28.4* 29.0*  RDW 22.3* 21.2* 20.8*  PLT 160 146* 147*     Patient ID   Keith [name] is a 55 y.o. male with a hx of stage IV clear cell RCC, recent GI bleeding, and prior PTE who is being seen today for the evaluation of CHF at the request of Dr Maylene Roes. Assessment & Plan    1.  Acute systolic dysfunction The patient presents with anasarca.  This is likely multifactorial.  He has low albumen, recent multiple PRBCs, and acute renal failure.  In addition, he has EF 40%.  It could be that he has fallen off of the Starling curve and that reduced EF is secondary.  It could also be that he has primary heart failure. He does not have ischemic symptoms and is currently not a candidate for invasive cardiology testing given ongoing/ unstable anemia.  His Cabometyx has been discontinued.  On my review this medicine is associated with CHF (primarily in low numbers- case reports).  As I cannot exclude other causes, we will consider other secondary causes of CHF also as we initiate therapy.   New recommendations: Continue IV diuresis, increase lasix to 80mg  IV BID Started on losartan yesterday as well as spironolactone Add coreg as CHF improves Will need further investigation secondary causes of CHF also as we initiate therapy.  Thompson Grayer MD, [facility] 02/03/2021 8:22 AM

## 2021-02-04 DIAGNOSIS — R0602 Shortness of breath: Secondary | ICD-10-CM | POA: Diagnosis not present

## 2021-02-04 LAB — BODY FLUID CULTURE: Culture: NO GROWTH

## 2021-02-04 LAB — PH, BODY FLUID: pH, Body Fluid: 7.9

## 2021-02-04 LAB — CBC
HCT: 25.7 % — ABNORMAL LOW (ref 39.0–52.0)
Hemoglobin: 7.6 g/dL — ABNORMAL LOW (ref 13.0–17.0)
MCH: 31.8 pg (ref 26.0–34.0)
MCHC: 29.6 g/dL — ABNORMAL LOW (ref 30.0–36.0)
MCV: 107.5 fL — ABNORMAL HIGH (ref 80.0–100.0)
Platelets: 151 10*3/uL (ref 150–400)
RBC: 2.39 MIL/uL — ABNORMAL LOW (ref 4.22–5.81)
RDW: 19.5 % — ABNORMAL HIGH (ref 11.5–15.5)
WBC: 5.6 10*3/uL (ref 4.0–10.5)
nRBC: 0.4 % — ABNORMAL HIGH (ref 0.0–0.2)

## 2021-02-04 LAB — BASIC METABOLIC PANEL
Anion gap: 9 (ref 5–15)
BUN: 27 mg/dL — ABNORMAL HIGH (ref 6–20)
CO2: 32 mmol/L (ref 22–32)
Calcium: 8.1 mg/dL — ABNORMAL LOW (ref 8.9–10.3)
Chloride: 102 mmol/L (ref 98–111)
Creatinine, Ser: 1.62 mg/dL — ABNORMAL HIGH (ref 0.61–1.24)
GFR, Estimated: 50 mL/min — ABNORMAL LOW (ref >60)
Glucose, Bld: 107 mg/dL — ABNORMAL HIGH (ref 70–99)
Potassium: 4.7 mmol/L (ref 3.5–5.1)
Sodium: 143 mmol/L (ref 135–145)

## 2021-02-04 LAB — CYTOLOGY - NON PAP

## 2021-02-04 MED ORDER — CARVEDILOL 6.25 MG PO TABS
6.2500 mg | ORAL_TABLET | Freq: Two times a day (BID) | ORAL | Status: DC
  Administered 2021-02-04 – 2021-02-05 (×3): 6.25 mg via ORAL
  Filled 2021-02-04 (×3): qty 1

## 2021-02-04 NOTE — Progress Notes (Addendum)
Progress Note  Patient Name: Keith [name] Date of Encounter: 02/04/2021  [facility] Cardiologist: New (Dr. Audie Box)  Subjective   No acute overnight events. Breathing is better but not back to baseline yet. Edema has also improved but still gross volume overloaded. No chest pain.  Had long discussion with patient and wife about Echo report and diagnosis of CHF.  Inpatient Medications    Scheduled Meds: . furosemide  80 mg Intravenous BID  . losartan  25 mg Oral Daily  . pantoprazole  40 mg Oral Daily  . sodium chloride flush  3 mL Intravenous Q12H  . spironolactone  25 mg Oral Daily   Continuous Infusions: [name] Morphine INTRATHECAL pump     PRN Meds: acetaminophen, ALPRAZolam, HYDROmorphone, metoprolol tartrate, ondansetron **OR** ondansetron (ZOFRAN) IV   Vital Signs    Vitals:   02/03/21 1232 02/03/21 2118 02/04/21 0500 02/04/21 0547  BP: 131/77 (!) 146/87  (!) 145/90  Pulse: 88 79  67  Resp: 16 20  18   Temp: 98 F (36.7 C) 97.8 F (36.6 C)  98.1 F (36.7 C)  TempSrc: Oral Oral  Oral  SpO2: 90% 92%  98%  Weight:   80.3 kg   Height:        Intake/Output Summary (Last 24 hours) at 02/04/2021 0710 Last data filed at 02/04/2021 0300 Gross per 24 hour  Intake -  Output 2600 ml  Net -2600 ml   Last 3 Weights 02/04/2021 02/02/2021 02/02/2021  Weight (lbs) 177 lb 0.5 oz 180 lb 1.9 oz 180 lb 1.9 oz  Weight (kg) 80.3 kg 81.7 kg 81.7 kg  Some encounter information is confidential and restricted. Go to Review Flowsheets activity to see all data.      Telemetry    Normal sinus rhythm with rates in the 70's to 90's. Occasional PVCs. And short run of SVT. - Personally Reviewed  ECG    No new ECG tracings today. - Personally Reviewed  Physical Exam   GEN: No acute distress.   Neck: Supple. No JVD Cardiac: RRR. No murmurs, rubs, or gallops. Radial pulses 2+ and equal bilaterally. Respiratory: No increased work of breathing. Mild crackles in right lung  base. GI: Soft, non-distended, and non-tender. Bowel sounds present. MS: 1-2+ edema of bilateral upper and lower extremities. No deformity. Skin: Warm and dry. Neuro:  No focal deficits. Psych: Normal affect. Responds appropriately.  Labs    High Sensitivity Troponin:   Recent Labs  Lab 01/31/21 0621 01/31/21 0920  TROPONINIHS 55* 29*      Chemistry Recent Labs  Lab 01/31/21 0606 02/01/21 0459 02/02/21 0600 02/03/21 0536 02/04/21 0541  NA 139 140 140 141 143  K 5.0 4.3 4.2 4.5 4.7  CL 104 104 103 104 102  CO2 26 27 28 29  32  GLUCOSE 125* 111* 109* 109* 107*  BUN 34* 32* 30* 27* 27*  CREATININE 1.80* 1.63* 1.56* 1.60* 1.62*  CALCIUM 8.2* 8.4* 8.0* 7.8* 8.1*  PROT 5.5* 6.2*  --   --   --   ALBUMIN 2.2* 2.4*  --   --   --   AST 14* 16  --   --   --   ALT 13 13  --   --   --   ALKPHOS 63 66  --   --   --   BILITOT 0.6 0.6  --   --   --   GFRNONAA 44* 50* 53* 51* 50*  ANIONGAP 9 9 9  8 9     Hematology Recent Labs  Lab 02/02/21 0600 02/03/21 0536 02/04/21 0541  WBC 4.9 5.0 5.6  RBC 2.15* 2.40* 2.39*  HGB 6.7* 7.6* 7.6*  HCT 23.6* 26.2* 25.7*  MCV 109.8* 109.2* 107.5*  MCH 31.2 31.7 31.8  MCHC 28.4* 29.0* 29.6*  RDW 21.2* 20.8* 19.5*  PLT 146* 147* 151    BNP Recent Labs  Lab 01/31/21 0606  BNP 341.5*     DDimer  Recent Labs  Lab 01/31/21 0606  DDIMER 2.80*     Radiology    No results found.  Cardiac Studies   Echocardiogram 02/01/2021: Impressions: 1. Left ventricular ejection fraction, by estimation, is 40 to 45%. The  left ventricle has mild to moderately decreased function. The left  ventricle demonstrates global hypokinesis. There is mild left ventricular  hypertrophy. Left ventricular diastolic  parameters are consistent with Grade I diastolic dysfunction (impaired  relaxation).  2. Right ventricular systolic function is normal. The right ventricular  size is normal. Tricuspid regurgitation signal is inadequate for assessing  PA  pressure.  3. Mild diastolic collapse.  4. A small pericardial effusion is present. The pericardial effusion is  circumferential. There is no evidence of cardiac tamponade. Large pleural  effusion in both left and right lateral regions.  5. The mitral valve is grossly normal. Trivial mitral valve  regurgitation.  6. The aortic valve is tricuspid. Aortic valve regurgitation is not  visualized.  7. The inferior vena cava is dilated in size with <50% respiratory  variability, suggesting right atrial pressure of 15 mmHg.   Patient Profile     55 y.o. 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 Cardiology consulted for evaluation of acute CHF.  Assessment & Plan    New Onset Systolic CHF - BNP elevated at 341.  - Chest x-ray showed interval progression of right suprahilar and right infrahilar pulmonary metastatic disease and new left base collapse/consolidation with small left pleura effusion. - Echo showed LVEF of 40-45% with global hypokinesis, mild LVH, and grade 1 diastolic dysfunction. RV size and function normal. Small pericardial effusion present with no evidence of tamponade. No significant valvular disease. - Started on IV Lasix 80mg  twice daily yesterday. Documented 2.6 L of urinary output yesterday and net negative 3.5 L. Weight down 3 lbs since yesterday. Renal function stable. - Patient is still grossly volume overloaded but he states breathing and edema are improving. - Continue  current dose of IV Lasix.  - Will increase Losartan to 25mg  daily. - Continue Spironolactone 25mg  daily. - Think we can likely add Coreg but will discuss with MD. - Albumin also low at 2.1 to 2.4 which is contributing to anasarca. Management per primary team. - Continue to monitor daily weight, strict I/O's, and renal function. - Etiology possible secondary to use of Cabozantinib. There have been a few case reports of this.  Cabozantinib can cause hypertension and patient/wife note that BP has been elevated for a longtime so may be hypertensive cardiomyopathy. Patient denies any chest pain also he does  have a significant smoking history. Cannot rule out ischemic cardiomyopathy. He is not a great candidate for cardiac catheterization at this time as he would likely not be able to tolerate DAPT. However, will discuss with MD about possible stress test.  Demand Ischemia - High sensitivity troponin minimally elevated and flat at 55 >> 29. Not consistent with ACS. - EKG showed shows non-specific ST/T changes.  - Echo as above.  - Likely secondary to acute CHF. No additional ischemic evaluation necessary at this time especially with recurrent anemia requiring blood transfusion and metastatic renal cancer.  SVT - Short run of SVT noted on telemetry - Not currently on beta-blocker due to acute CHF. However, I think we can likely start Coreg now. Will discuss with MD.  Symptomatic Acute on Chronic Anemia - Hemoglobin 6.1 on admission. Recent baseline around 8-9.  - S/p 2 units of PRBCs so far this admission. Hemoglobin 7.6 today. - GI consulted and recommended stopping Xarelto indefinitely. If patient has rebleeding off Xarelto, the additional GI work-up is warranted.  PE - GI has recommended stopping Xarelto indefinitely.  Hypertension - BP mildly elevated at times.  - Continue Losartan and Spironolactone as above. - Would like to start Coreg as above.  Acute on CKD Stage III - Creatinine  1.8 on admission and 1.62 today. Baseline around 1.2 to 1.4. - Continue to monitor closely with diuresis.  Tobacco Abuse - Patient has significant smoking history. Currently smokes 1-2 packs per day and he states he has smoked since he was young.  - Discussed importance of complete cessation and he seems willing to quit at this time.  Otherwise, per primary team: - Metastatic renal cancer with metastasis to lungs and bone - Cancer related pain   For questions or updates, please contact [place] Please consult [email or web address] for contact info under        Signed, Darreld Mclean, PA-C  02/04/2021, 7:10 AM

## 2021-02-04 NOTE — Progress Notes (Signed)
  PROGRESS NOTE  Alerted by RN that patient has a morphine intrathecal pump that needs to be refilled on Wednesday at [facility] Neurosurgery office. Patient will likely miss that appointment due to requiring hospitalization for diuresis. I spoke with Simeon Craft, PA at [facility] Neuro[facility]. Patient will need his pump refilled while inpatient and interrogated to ensure it will not run empty while in house. Planning to transfer to [facility] for neurosurgery team evaluation. Judson Roch will arrange for follow up once patient arrives at [facility].    Dessa Phi, DO [facility] 02/04/2021, 4:28 PM  Available via Epic secure chat 7am-7pm After these hours, please refer to coverage provider listed on [email or web address]

## 2021-02-04 NOTE — Progress Notes (Signed)
Notified MD that pt has a pain pump in his abd that is due to be refilled. He goes to [facility], PA-C. He stated it starts to beep when it runs out - had an appt tomorrow to have refilled. Andre Lefort

## 2021-02-04 NOTE — Plan of Care (Signed)
  Problem: Education: Goal: Knowledge of General Education information will improve Description: Including pain rating scale, medication(s)/side effects and non-pharmacologic comfort measures Outcome: Progressing   Problem: Health Behavior/Discharge Planning: Goal: Ability to manage health-related needs will improve Outcome: Progressing   Problem: Clinical Measurements: Goal: Ability to maintain clinical measurements within normal limits will improve Outcome: Progressing Goal: Will remain free from infection Outcome: Progressing Goal: Diagnostic test results will improve Outcome: Progressing Goal: Respiratory complications will improve Outcome: Progressing Goal: Cardiovascular complication will be avoided Outcome: Progressing   Problem: Coping: Goal: Level of anxiety will decrease Outcome: Progressing   Problem: Pain Managment: Goal: General experience of comfort will improve Outcome: Progressing

## 2021-02-04 NOTE — Progress Notes (Signed)
PROGRESS NOTE    Keith [name]  ATF:[id number] DOB: Nov 19, 1966 DOA: 01/31/2021 PCP: [name], MD     Brief Narrative:  Keith [name] is a 55 year old male with past medical history significant for metastatic renal cell carcinoma to the lung and bone status post radical nephrectomy and radiotherapy, cancer-related pain with morphine intrathecal pump, PE on Xarelto, recurrent GI bleeding and anemia who presented to the emergency department with complaints of generalized weakness, lower extremity edema, shortness of breath.  He admits to dyspnea worse with exertion, associated with dry cough and scrotal swelling as well as orthopnea.  He was seen by his oncologist as an outpatient on 01/22/2021 and was prescribed Lasix for generalized anasarca and his cabozantinib was on hold due to his poor health.  Patient has not had much improvement with Lasix.  He was also evaluated by  GI on 01/24/2021 with plans for outpatient video capsule study due to GI bleeding.  Patient was admitted for acute hypoxic respiratory failure, anasarca, symptomatic anemia.  He was given 1 unit packed red blood cell and IV Lasix.  PCCM, oncology, GI consulted.  Patient underwent bedside thoracentesis 2/11 with 500 cc fluid removal.  Echocardiogram revealed combined systolic and diastolic heart failure.  Cardiology consulted.  New events last 24 hours / Subjective: On 2 L of oxygen this morning.  Feeling well, continues to have lower extremity edema.  In good spirits.  Assessment & Plan:   Principal Problem:   Shortness of breath Active Problems:   Renal cancer ([place])   Metastasis to lung (HCC)   Benign essential HTN   Symptomatic anemia   Pulmonary embolus, left (HCC)   Metastasis from malignant tumor of kidney (HCC)   Acute GI bleeding   Acute blood loss anemia   Anasarca   Pleural effusion   Acute hypoxemic respiratory failure with left pleural effusion, concern for new onset heart failure -SpO2 89% on  room air, and initially requiring 4L O2 -BNP 341.5 -Status post thoracentesis with 500 cc fluid removal -Norvasc on hold due to edema -Now on 2 L oxygen, continue to wean as able  Acute combined systolic and diastolic CHF -Echocardiogram showing EF 40-45%, global hypokinesis, grade 1 diastolic dysfunction, new since previous echo in 2016 -Cardiology following  -Continue IV Lasix, dose increased -Started on losartan, spironolactone -Low-sodium, fluid restriction diet -Strict I's and O's, daily weight  Symptomatic anemia with acute blood loss -Status post 1 unit packed red blood cell 2/10, 1 unit 2/12 -Initially had plans for video capsule study as an outpatient -Appreciate GI.  Recommended to stop Xarelto indefinitely.  If patient has rebleeding off Xarelto, then continue GI work-up.  GI has signed off at this point.  No overt bleeding  Metastatic RCC to lung and bone status post radical nephrectomy and XRT -Appreciate oncology -Cancer related pain on dilaudid and morphine intrathecal pump, continue  PE -Xarelto on hold indefinitely  Demand ischemia -Secondary to #1 -Troponin trended 55 --> 29  AKI -Baseline creatinine 1.2-1.4 -Creatinine remains stable.  Continue to monitor while on diuretic    DVT prophylaxis: SCDs Start: 01/31/21 0849  Code Status: Full code Family Communication: Wife at bedside Disposition Plan:  Status is: Inpatient  Remains inpatient appropriate because:IV treatments appropriate due to intensity of illness or inability to take PO and Inpatient level of care appropriate due to severity of illness   Dispo: The patient is from: Home  Anticipated d/c is to: Home              Anticipated d/c date is: 2 days              Patient currently is not medically stable to d/c.  Continue IV diuresis   Difficult to place patient No      Consultants:   Oncology  GI  PCCM  Cardiology   Antimicrobials:  Anti-infectives (From  admission, onward)   None       Objective: Vitals:   02/04/21 0500 02/04/21 0547 02/04/21 0700 02/04/21 0802  BP:  (!) 145/90    Pulse:  67    Resp:  18    Temp:  98.1 F (36.7 C)    TempSrc:  Oral    SpO2:  98% 100% 100%  Weight: 80.3 kg     Height:        Intake/Output Summary (Last 24 hours) at 02/04/2021 1043 Last data filed at 02/04/2021 0943 Gross per 24 hour  Intake --  Output 2825 ml  Net -2825 ml   Filed Weights   02/02/21 0440 02/02/21 0748 02/04/21 0500  Weight: 81.7 kg 81.7 kg 80.3 kg    Examination: General exam: Appears calm and comfortable  Respiratory system: Clear to auscultation. Respiratory effort normal.  On 2 L oxygen without conversational dyspnea Cardiovascular system: S1 & S2 heard, RRR.  Bilateral pedal edema. Gastrointestinal system: Abdomen is nondistended, soft and nontender. Normal bowel sounds heard. Central nervous system: Alert and oriented. Non focal exam. Speech clear  Extremities: Symmetric in appearance bilaterally  Skin: No rashes, lesions or ulcers on exposed skin  Psychiatry: Judgement and insight appear stable. Mood & affect appropriate.    Data Reviewed: I have personally reviewed following labs and imaging studies  CBC: Recent Labs  Lab 01/31/21 0635 01/31/21 1745 02/01/21 0459 02/02/21 0600 02/03/21 0536 02/04/21 0541  WBC 6.8  --  5.4 4.9 5.0 5.6  NEUTROABS 5.4  --  3.5  --   --   --   HGB 6.1* 7.4* 7.8* 6.7* 7.6* 7.6*  HCT 21.6* 27.1* 26.5* 23.6* 26.2* 25.7*  MCV 113.7*  --  109.1* 109.8* 109.2* 107.5*  PLT 178  --  160 146* 147* 350   Basic Metabolic Panel: Recent Labs  Lab 01/31/21 0606 02/01/21 0459 02/02/21 0600 02/03/21 0536 02/04/21 0541  NA 139 140 140 141 143  K 5.0 4.3 4.2 4.5 4.7  CL 104 104 103 104 102  CO2 26 27 28 29  32  GLUCOSE 125* 111* 109* 109* 107*  BUN 34* 32* 30* 27* 27*  CREATININE 1.80* 1.63* 1.56* 1.60* 1.62*  CALCIUM 8.2* 8.4* 8.0* 7.8* 8.1*   GFR: Estimated Creatinine  Clearance: 59.9 mL/min (A) (by C-G formula based on SCr of 1.62 mg/dL (H)). Liver Function Tests: Recent Labs  Lab 01/31/21 0606 02/01/21 0459  AST 14* 16  ALT 13 13  ALKPHOS 63 66  BILITOT 0.6 0.6  PROT 5.5* 6.2*  ALBUMIN 2.2* 2.4*   No results for input(s): LIPASE, AMYLASE in the last 168 hours. No results for input(s): AMMONIA in the last 168 hours. Coagulation Profile: No results for input(s): INR, PROTIME in the last 168 hours. Cardiac Enzymes: No results for input(s): CKTOTAL, CKMB, CKMBINDEX, TROPONINI in the last 168 hours. BNP (last 3 results) No results for input(s): PROBNP in the last 8760 hours. HbA1C: No results for input(s): HGBA1C in the last 72 hours. CBG: No results for input(s): GLUCAP  in the last 168 hours. Lipid Profile: No results for input(s): CHOL, HDL, LDLCALC, TRIG, CHOLHDL, LDLDIRECT in the last 72 hours. Thyroid Function Tests: Recent Labs    02/02/21 0840 02/02/21 0844  TSH  --  8.890*  FREET4 0.93  --    Anemia Panel: No results for input(s): VITAMINB12, FOLATE, FERRITIN, TIBC, IRON, RETICCTPCT in the last 72 hours. Sepsis Labs: No results for input(s): PROCALCITON, LATICACIDVEN in the last 168 hours.  Recent Results (from the past 240 hour(s))  Resp Panel by RT-PCR (Flu A&B, Covid) Nasopharyngeal Swab     Status: None   Collection Time: 01/31/21  5:47 AM   Specimen: Nasopharyngeal Swab; Nasopharyngeal(NP) swabs in vial transport medium  Result Value Ref Range Status   SARS Coronavirus 2 by RT PCR NEGATIVE NEGATIVE Final    Comment: (NOTE) SARS-CoV-2 target nucleic acids are NOT DETECTED.  The SARS-CoV-2 RNA is generally detectable in upper respiratory specimens during the acute phase of infection. The lowest concentration of SARS-CoV-2 viral copies this assay can detect is 138 copies/mL. A negative result does not preclude SARS-Cov-2 infection and should not be used as the sole basis for treatment or other patient management  decisions. A negative result may occur with  improper specimen collection/handling, submission of specimen other than nasopharyngeal swab, presence of viral mutation(s) within the areas targeted by this assay, and inadequate number of viral copies(<138 copies/mL). A negative result must be combined with clinical observations, patient history, and epidemiological information. The expected result is Negative.  Fact Sheet for Patients:  EntrepreneurPulse.com.au  Fact Sheet for Healthcare Providers:  IncredibleEmployment.be  This test is no t yet approved or cleared by the [place] FDA and  has been authorized for detection and/or diagnosis of SARS-CoV-2 by FDA under an Emergency Use Authorization (EUA). This EUA will remain  in effect (meaning this test can be used) for the duration of the COVID-19 declaration under Section 564(b)(1) of the Act, 21 U.S.C.section 360bbb-3(b)(1), unless the authorization is terminated  or revoked sooner.       Influenza A by PCR NEGATIVE NEGATIVE Final   Influenza B by PCR NEGATIVE NEGATIVE Final    Comment: (NOTE) The Xpert Xpress SARS-CoV-2/FLU/RSV plus assay is intended as an aid in the diagnosis of influenza from Nasopharyngeal swab specimens and should not be used as a sole basis for treatment. Nasal washings and aspirates are unacceptable for Xpert Xpress SARS-CoV-2/FLU/RSV testing.  Fact Sheet for Patients: EntrepreneurPulse.com.au  Fact Sheet for Healthcare Providers: IncredibleEmployment.be  This test is not yet approved or cleared by the [place] FDA and has been authorized for detection and/or diagnosis of SARS-CoV-2 by FDA under an Emergency Use Authorization (EUA). This EUA will remain in effect (meaning this test can be used) for the duration of the COVID-19 declaration under Section 564(b)(1) of the Act, 21 U.S.C. section 360bbb-3(b)(1), unless the  authorization is terminated or revoked.  Performed at [facility], [place] [address], [place], [place] [address]   Body fluid culture (includes gram stain)     Status: None   Collection Time: 02/01/21 12:24 PM   Specimen: Pleural Fluid  Result Value Ref Range Status   Specimen Description   Final    PLEURAL LT Performed at [facility] [address] [place], [place] [address]    Special Requests   Final    NONE Performed at [facility], [place] [address], [place],  [address]    Gram Stain  Final    MODERATE WBC PRESENT,BOTH PMN AND MONONUCLEAR NO ORGANISMS SEEN    Culture   Final    NO GROWTH 3 DAYS Performed at [facility], [place] [address], [place], [place] [address]    Report Status 02/04/2021 FINAL  Final      Radiology Studies: No results found.    Scheduled Meds: . furosemide  80 mg Intravenous BID  . losartan  25 mg Oral Daily  . pantoprazole  40 mg Oral Daily  . sodium chloride flush  3 mL Intravenous Q12H  . spironolactone  25 mg Oral Daily   Continuous Infusions: [name] Morphine INTRATHECAL pump       LOS: 4 days      Time spent: 20 minutes   Dessa Phi, DO [facility] 02/04/2021, 10:43 AM   Available via Epic secure chat 7am-7pm After these hours, please refer to coverage provider listed on [email or web address]

## 2021-02-05 DIAGNOSIS — R0602 Shortness of breath: Secondary | ICD-10-CM | POA: Diagnosis not present

## 2021-02-05 LAB — BASIC METABOLIC PANEL
Anion gap: 8 (ref 5–15)
BUN: 25 mg/dL — ABNORMAL HIGH (ref 6–20)
CO2: 31 mmol/L (ref 22–32)
Calcium: 8.1 mg/dL — ABNORMAL LOW (ref 8.9–10.3)
Chloride: 98 mmol/L (ref 98–111)
Creatinine, Ser: 1.7 mg/dL — ABNORMAL HIGH (ref 0.61–1.24)
GFR, Estimated: 48 mL/min — ABNORMAL LOW (ref >60)
Glucose, Bld: 141 mg/dL — ABNORMAL HIGH (ref 70–99)
Potassium: 4.2 mmol/L (ref 3.5–5.1)
Sodium: 137 mmol/L (ref 135–145)

## 2021-02-05 LAB — CBC
HCT: 29.2 % — ABNORMAL LOW (ref 39.0–52.0)
Hemoglobin: 8.2 g/dL — ABNORMAL LOW (ref 13.0–17.0)
MCH: 30.6 pg (ref 26.0–34.0)
MCHC: 28.1 g/dL — ABNORMAL LOW (ref 30.0–36.0)
MCV: 109 fL — ABNORMAL HIGH (ref 80.0–100.0)
Platelets: 172 10*3/uL (ref 150–400)
RBC: 2.68 MIL/uL — ABNORMAL LOW (ref 4.22–5.81)
RDW: 19 % — ABNORMAL HIGH (ref 11.5–15.5)
WBC: 6.7 10*3/uL (ref 4.0–10.5)
nRBC: 0 % (ref 0.0–0.2)

## 2021-02-05 LAB — GLUCOSE, CAPILLARY
Glucose-Capillary: 148 mg/dL — ABNORMAL HIGH (ref 70–99)
Glucose-Capillary: 75 mg/dL (ref 70–99)
Glucose-Capillary: 75 mg/dL (ref 70–99)

## 2021-02-05 MED ORDER — ENSURE ENLIVE PO LIQD
237.0000 mL | Freq: Two times a day (BID) | ORAL | Status: DC
  Administered 2021-02-05 – 2021-02-06 (×2): 237 mL via ORAL

## 2021-02-05 MED ORDER — CARVEDILOL 12.5 MG PO TABS
12.5000 mg | ORAL_TABLET | Freq: Two times a day (BID) | ORAL | Status: DC
  Administered 2021-02-05 – 2021-02-06 (×2): 12.5 mg via ORAL
  Filled 2021-02-05 (×2): qty 1

## 2021-02-05 MED ORDER — TORSEMIDE 20 MG PO TABS
20.0000 mg | ORAL_TABLET | Freq: Every day | ORAL | Status: DC
  Administered 2021-02-05: 20 mg via ORAL
  Filled 2021-02-05: qty 1

## 2021-02-05 NOTE — Progress Notes (Addendum)
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  He was complaining of dyspnea worse on exertion, dry cough, scrotal swelling as well as orthopnea.  He was seen by his oncologist on 01/23/2024 and was prescribed Lasix for generalized anasarca which did not improve his edema.  He was also seen by GI on 01/24/2021 with plan for outpatient video capsule study. Patient was admitted for the management of acute hypoxic respiratory failure, anasarca, symptomatic anemia.  He was given a unit of transfusion during this hospitalization.  He is being followed by PCCM, oncology, GI, cardiology during this hospitalization.  He underwent bedside thoracentesis on 2/11.  Echocardiogram showed combined systolic/diastolic congestive heart failure. Currently he is waiting for refill/intubation of his intrathecal pump by neurosurgery. PT/OT evaluation pending.  Assessment & Plan:   Principal Problem:   Shortness of breath Active Problems:   Renal cancer (HCC)   Metastasis to lung (HCC)   Benign essential HTN   Symptomatic anemia   Pulmonary embolus, left (HCC)   Metastasis from malignant tumor of kidney (HCC)   Acute GI bleeding   Acute blood loss anemia   Anasarca   Pleural effusion   Acute hypoxic respiratory failure: Secondary to pleural effusion, heart failure.  Currently requiring 3L  of oxygen per minute.  Continue supplemental oxygen as needed, continue to taper and stop.  Elevated BNP.   Continue monitor daily input/output, daily weight. Will check if he qualifies for home  oxygen.  Auscultation of the lungs revealed bilateral decreased air entry in the bases but no wheezes or crackles.  Acute combined systolic/diastolic congestive heart failure: Echocardiogram done on this admission showed EF of 40 to 45%, severe hypokinesis, grade 1 diastolic dysfunction.  Cardiology following, no plan for cardiac cath. Patient will be continued on Coreg, losartan, Aldactone,torsemide.   Underwent thoracentesis on 2/11 with removal of 500 cc of fluid. Currently he is euvolemic.  Symptomatic anemia/history of GI bleed: Transfused with a unit of PRBC during this hospitalization.  He was following with GI as an outpatient.  Plan was for video capsule study.  GI following here,now signed off.  Recommended to stop Xarelto indefinitely. Currently hemoglobin stable, no active bleeding.  Metastatic cancer to the lung/bone: Status post radical nephrectomy and radiation therapy.  Oncology was following here. Has severe cancer-related pain and is on Dilaudid/morphine intrathecal pump.  Neurosurgery has been consulted for pump to be refilled  History of PE: On Xarelto, now stopped  Mildly elevated troponin: Secondary to demand ischemia.  No chest pain.  No further intervention.  AKI on CKD stage IIIa: Baseline creatinine ranges from 1.2-1.4.  Creatinine 1.7 today and trending up .  Check BMP tomorrow.  Subclinical hypothyroidism: TSH of 8. Free  T4 normal.  Recheck TFT in 4 to 6 weeks.  Debility/deconditioning: We have requested for PT/OT evaluation           DVT prophylaxis:SCD Code Status:Full  Family Communication: None at the bedside Status is: Inpatient  Remains inpatient appropriate because:Inpatient level of care appropriate due to severity of  illness   Dispo: The patient is from: Home              Anticipated d/c is to: Home              Anticipated d/c date is: 1 day               Patient currently is not medically stable to d/c.   Difficult to place patient No    Consultants: Cardiology, GI, neurosurgery  Procedures:  Antimicrobials:  Anti-infectives (From admission, onward)   None      Subjective: Patient seen and examined the bedside this morning.  Hemodynamically stable during my evaluation.  Very comfortable.  Denies any new complaints.  Awaiting intrathecal pump evaluation by neurosurgery  Objective: Vitals:   02/04/21 1613 02/04/21 1909 02/05/21 0004 02/05/21 0403  BP:  (!) 146/80 126/83 115/83  Pulse: 62 80 86 87  Resp:  17 17 15   Temp:  98.5 F (36.9 C) 98 F (36.7 C) 98.9 F (37.2 C)  TempSrc:  Oral Oral Oral  SpO2:  100% 100% 97%  Weight:    75.8 kg  Height:    6\' 4"  (1.93 m)    Intake/Output Summary (Last 24 hours) at 02/05/2021 0825 Last data filed at 02/05/2021 0000 Gross per 24 hour  Intake 1440 ml  Output 2350 ml  Net -910 ml   Filed Weights   02/02/21 0748 02/04/21 0500 02/05/21 0403  Weight: 81.7 kg 80.3 kg 75.8 kg    Examination:  General exam: Pleasant male, appears older than age ,thin built HEENT:PERRL,Oral mucosa moist, Ear/Nose normal on gross exam Respiratory system: Bilateral diminished air breath sounds on bases, no wheezes or crackles  Cardiovascular system: S1 & S2 heard, RRR. No JVD, murmurs, rubs, gallops or clicks. No pedal edema. Gastrointestinal system: Abdomen is nondistended, soft and nontender. No organomegaly or masses felt. Normal bowel sounds heard.  Intrathecal pump device on the left lower quadrant Central nervous system: Alert and oriented. No focal neurological deficits. Extremities: No edema, no clubbing ,no cyanosis Skin: No rashes, lesions or ulcers,no icterus ,no pallor   Data Reviewed: I have personally reviewed following labs and imaging studies  CBC: Recent Labs  Lab 01/31/21 0635 01/31/21 1745 02/01/21 0459 02/02/21 0600 02/03/21 0536 02/04/21 0541  WBC 6.8  --  5.4 4.9  5.0 5.6  NEUTROABS 5.4  --  3.5  --   --   --   HGB 6.1* 7.4* 7.8* 6.7* 7.6* 7.6*  HCT 21.6* 27.1* 26.5* 23.6* 26.2* 25.7*  MCV 113.7*  --  109.1* 109.8* 109.2* 107.5*  PLT 178  --  160 146* 147* 528   Basic Metabolic Panel: Recent Labs  Lab 01/31/21 0606 02/01/21 0459 02/02/21 0600 02/03/21 0536 02/04/21 0541  NA 139 140 140 141 143  K 5.0 4.3 4.2 4.5 4.7  CL 104 104 103 104 102  CO2 26 27 28 29  32  GLUCOSE 125* 111* 109* 109* 107*  BUN 34* 32* 30* 27* 27*  CREATININE 1.80* 1.63* 1.56* 1.60* 1.62*  CALCIUM 8.2* 8.4* 8.0* 7.8* 8.1*   GFR: Estimated Creatinine Clearance: 56.5 mL/min (A) (by C-G formula based on SCr of 1.62 mg/dL (H)). Liver Function Tests: Recent Labs  Lab 01/31/21 0606 02/01/21 0459  AST 14* 16  ALT 13 13  ALKPHOS 63 66  BILITOT 0.6 0.6  PROT 5.5* 6.2*  ALBUMIN 2.2* 2.4*   No results for input(s): LIPASE, AMYLASE in the last 168 hours. No  results for input(s): AMMONIA in the last 168 hours. Coagulation Profile: No results for input(s): INR, PROTIME in the last 168 hours. Cardiac Enzymes: No results for input(s): CKTOTAL, CKMB, CKMBINDEX, TROPONINI in the last 168 hours. BNP (last 3 results) No results for input(s): PROBNP in the last 8760 hours. HbA1C: No results for input(s): HGBA1C in the last 72 hours. CBG: Recent Labs  Lab 02/05/21 0015  GLUCAP 148*   Lipid Profile: No results for input(s): CHOL, HDL, LDLCALC, TRIG, CHOLHDL, LDLDIRECT in the last 72 hours. Thyroid Function Tests: Recent Labs    02/02/21 0840 02/02/21 0844  TSH  --  8.890*  FREET4 0.93  --    Anemia Panel: No results for input(s): VITAMINB12, FOLATE, FERRITIN, TIBC, IRON, RETICCTPCT in the last 72 hours. Sepsis Labs: No results for input(s): PROCALCITON, LATICACIDVEN in the last 168 hours.  Recent Results (from the past 240 hour(s))  Resp Panel by RT-PCR (Flu A&B, Covid) Nasopharyngeal Swab     Status: None   Collection Time: 01/31/21  5:47 AM   Specimen:  Nasopharyngeal Swab; Nasopharyngeal(NP) swabs in vial transport medium  Result Value Ref Range Status   SARS Coronavirus 2 by RT PCR NEGATIVE NEGATIVE Final    Comment: (NOTE) SARS-CoV-2 target nucleic acids are NOT DETECTED.  The SARS-CoV-2 RNA is generally detectable in upper respiratory specimens during the acute phase of infection. The lowest concentration of SARS-CoV-2 viral copies this assay can detect is 138 copies/mL. A negative result does not preclude SARS-Cov-2 infection and should not be used as the sole basis for treatment or other patient management decisions. A negative result may occur with  improper specimen collection/handling, submission of specimen other than nasopharyngeal swab, presence of viral mutation(s) within the areas targeted by this assay, and inadequate number of viral copies(<138 copies/mL). A negative result must be combined with clinical observations, patient history, and epidemiological information. The expected result is Negative.  Fact Sheet for Patients:  EntrepreneurPulse.com.au  Fact Sheet for Healthcare Providers:  IncredibleEmployment.be  This test is no t yet approved or cleared by the [place] FDA and  has been authorized for detection and/or diagnosis of SARS-CoV-2 by FDA under an Emergency Use Authorization (EUA). This EUA will remain  in effect (meaning this test can be used) for the duration of the COVID-19 declaration under Section 564(b)(1) of the Act, 21 U.S.C.section 360bbb-3(b)(1), unless the authorization is terminated  or revoked sooner.       Influenza A by PCR NEGATIVE NEGATIVE Final   Influenza B by PCR NEGATIVE NEGATIVE Final    Comment: (NOTE) The Xpert Xpress SARS-CoV-2/FLU/RSV plus assay is intended as an aid in the diagnosis of influenza from Nasopharyngeal swab specimens and should not be used as a sole basis for treatment. Nasal washings and aspirates are unacceptable for  Xpert Xpress SARS-CoV-2/FLU/RSV testing.  Fact Sheet for Patients: EntrepreneurPulse.com.au  Fact Sheet for Healthcare Providers: IncredibleEmployment.be  This test is not yet approved or cleared by the [place] FDA and has been authorized for detection and/or diagnosis of SARS-CoV-2 by FDA under an Emergency Use Authorization (EUA). This EUA will remain in effect (meaning this test can be used) for the duration of the COVID-19 declaration under Section 564(b)(1) of the Act, 21 U.S.C. section 360bbb-3(b)(1), unless the authorization is terminated or revoked.  Performed at [facility], [place] [address], [place], [place] [address]   Body fluid culture (includes gram stain)     Status: None   Collection Time: 02/01/21  12:24 PM   Specimen: Pleural Fluid  Result Value Ref Range Status   Specimen Description   Final    PLEURAL LT Performed at [place] [address] [place], [place] [address]    Special Requests   Final    NONE Performed at [facility], [place] [address] [place], [place] [address]    Gram Stain   Final    MODERATE WBC PRESENT,BOTH PMN AND MONONUCLEAR NO ORGANISMS SEEN    Culture   Final    NO GROWTH 3 DAYS Performed at [facility], [place] [address], [place], [place] [address]    Report Status 02/04/2021 FINAL  Final         Radiology Studies: No results found.      Scheduled Meds: . carvedilol  6.25 mg Oral BID WC  . furosemide  80 mg Intravenous BID  . losartan  25 mg Oral Daily  . pantoprazole  40 mg Oral Daily  . sodium chloride flush  3 mL Intravenous Q12H  . spironolactone  25 mg Oral Daily   Continuous Infusions: [name] Morphine INTRATHECAL pump       LOS: 5 days    Time spent: 25 mins.More than 50% of that time was spent in counseling and/or coordination of care.      Shelly Coss, MD [facility] P2/15/2022, 8:25  AM

## 2021-02-05 NOTE — Progress Notes (Signed)
Initial Nutrition Assessment  DOCUMENTATION CODES:   Severe malnutrition in context of chronic illness  INTERVENTION:    Ensure Enlive po BID, each supplement provides 350 kcal and 20 grams of protein  NUTRITION DIAGNOSIS:   Severe Malnutrition related to catabolic illness,chronic illness (cancer) as evidenced by severe fat depletion,severe muscle depletion.  GOAL:   Patient will meet greater than or equal to 90% of their needs  MONITOR:   PO intake,Supplement acceptance,Labs  REASON FOR ASSESSMENT:   Malnutrition Screening Tool    ASSESSMENT:   55 yo male admitted with acute decompensated CHF, grossly volume overloaded. PMH includes stage IV renal cell cancer with lung and bone mets, pulmonary embolism, recurrent GI bleeding, DM.   S/P thoracentesis 2/11 for pleural effusion; 500 ml fluid removed.  Patient reports that his weight PTA was fluctuating up and down. This is confirmed by review of weight history. Weight has been fluctuating between 72.6 kg and 86.6 kg over the past year. He states he has had cancer since 2009 and he has lost a lot of weight since then. No history of fluid retention problems per patient. He agreed to drink Ensure Enlive supplements BID to maximize oral intake. He drinks them at home sometimes.  Currently on a 2 gm sodium diet with 2 L fluid restriction.  Meal intakes recorded at 50-100%.  Labs reviewed.  CBG: 148 middle of the night last night  Medications reviewed and include Protonix, Aldactone.  Patient meets criteria for severe malnutrition with severe depletion of muscle and subcutaneous fat mass.  NUTRITION - FOCUSED PHYSICAL EXAM:  Flowsheet Row Most Recent Value  Orbital Region Moderate depletion  Upper Arm Region Severe depletion  Thoracic and Lumbar Region Severe depletion  Buccal Region Unable to assess  Temple Region Severe depletion  Clavicle Bone Region Severe depletion  Clavicle and Acromion Bone Region Severe  depletion  Scapular Bone Region Severe depletion  Dorsal Hand Mild depletion  Patellar Region No depletion  Anterior Thigh Region No depletion  Posterior Calf Region No depletion  Edema (RD Assessment) Mild  Hair Reviewed  Eyes Reviewed  Mouth Reviewed  Skin Reviewed  Nails Reviewed       Diet Order:   Diet Order            Diet 2 gram sodium Room service appropriate? Yes; Fluid consistency: Thin; Fluid restriction: 2000 mL Fluid  Diet effective now                 EDUCATION NEEDS:   Education needs have been addressed  Skin:  Skin Assessment: Reviewed RN Assessment  Last BM:  2/15  Height:   Ht Readings from Last 1 Encounters:  02/05/21 6\' 4"  (1.93 m)    Weight:   Wt Readings from Last 1 Encounters:  02/05/21 75.8 kg    Ideal Body Weight:  91.8 kg  BMI:  Body mass index is 20.34 kg/m.  Estimated Nutritional Needs:   Kcal:  2200-2400  Protein:  110-130 gm  Fluid:  2 L    Lucas Mallow, RD, LDN, CNSC Please refer to Amion for contact information.

## 2021-02-05 NOTE — Progress Notes (Signed)
Patient ID: Keith [name], male   DOB: 04-Oct-1966, 55 y.o.   MRN: [id number] Visit to refill Medtronic Synchromed II pump with morphine. Morphine 1mg /ml in 42ml PF NS.  [employer] / [facility]. SO#[id number] RPh: CD. 01/25/21 ordered by Simeon Craft PA-C, use by 03/05/21  Pt alert, conversant in good spirits. Family member present.  Left abdomen pump site without skin irritation or swelling. After interrogating pump, and cleansing with chloraprep, site was accessed using sterile technique with non-coring needle and 54ml clear liquid (residual morphine) was withdrawn and discarded (witnessed by pt and nurse). Within acceptable range of expected 77ml residual. Morphine 1mg /ml in 37ml PF NS introduced through wafer filter. No evidence of leaking at pump site. Removal of needle and pressure to site. No bleeding or bruising. Pump re-interrogated and volume reset to 29ml. No other changes made to pump program. Pt tolerated all well. He will follow up with Simeon Craft PA-C in office in next 1-2 months.

## 2021-02-05 NOTE — Care Management Important Message (Signed)
Important Message  Patient Details  Name: Keith [name] MRN: [id number] Date of Birth: 1966/09/14   Medicare Important Message Given:  Yes     Shelda Altes 02/05/2021, 11:30 AM

## 2021-02-05 NOTE — Evaluation (Signed)
Physical Therapy Evaluation and Discharge Patient Details Name: Keith [name] MRN: [id number] DOB: 08/04/1966 Today's Date: 02/05/2021   History of Present Illness  Keith [name] is a 55 y.o. male with 55 year old male with history of stage IV renal cell cancer, pulmonary embolism, recurrent GI bleeding, diabetes who was admitted on 01/31/2021 generalized weakness shortness of breath.  He was found to be grossly volume overloaded with acute decompensated systolic heart failure  Clinical Impression  Patient evaluated by Physical Therapy with no further acute PT needs identified. Prior to admission, pt lives with his spouse and is independent. Pt presents with mild balance deficits and decreased cardiopulmonary endurance. Ambulating x 350 feet with no assistive device or physical assist. SpO2 > 90% on RA, HR 80-107 bpm. Education provided regarding exercise recommendations, monitoring for signs/symptoms of HF, daily weights, and low sodium diet. All education has been completed and the patient has no further questions. No follow-up Physical Therapy or equipment needs. PT is signing off. Thank you for this referral.     Follow Up Recommendations No PT follow up    Equipment Recommendations  None recommended by PT    Recommendations for Other Services       Precautions / Restrictions Precautions Precautions: None Restrictions Weight Bearing Restrictions: No      Mobility  Bed Mobility Overal bed mobility: Independent                  Transfers Overall transfer level: Independent Equipment used: None                Ambulation/Gait Ambulation/Gait assistance: Supervision Gait Distance (Feet): 350 Feet Assistive device: None Gait Pattern/deviations: Step-through pattern;Decreased stride length Gait velocity: decreased   General Gait Details: Mild dynamic instability, slower pace, required one brief standing rest break  Stairs            Wheelchair  Mobility    Modified Rankin (Stroke Patients Only)       Balance Overall balance assessment: Mild deficits observed, not formally tested                                           Pertinent Vitals/Pain Pain Assessment: Faces Faces Pain Scale: Hurts a little bit Pain Location: LLE (chronic) Pain Descriptors / Indicators: Discomfort Pain Intervention(s): Monitored during session    Home Living Family/patient expects to be discharged to:: Private residence Living Arrangements: Spouse/significant other Available Help at Discharge: Family Type of Home: Mobile home Home Access: Stairs to enter   Technical brewer of Steps: 1 Home Layout: One level        Prior Function Level of Independence: Independent         Comments: Does not work. Enjoys fishing and yard work     Journalist, newspaper        Extremity/Trunk Assessment   Upper Extremity Assessment Upper Extremity Assessment: Overall WFL for tasks assessed    Lower Extremity Assessment Lower Extremity Assessment: Overall WFL for tasks assessed    Cervical / Trunk Assessment Cervical / Trunk Assessment: Normal  Communication   Communication: No difficulties  Cognition Arousal/Alertness: Awake/alert Behavior During Therapy: WFL for tasks assessed/performed Overall Cognitive Status: Within Functional Limits for tasks assessed  General Comments      Exercises     Assessment/Plan    PT Assessment Patent does not need any further PT services  PT Problem List         PT Treatment Interventions      PT Goals (Current goals can be found in the Care Plan section)  Acute Rehab PT Goals Patient Stated Goal: return to normal activities PT Goal Formulation: All assessment and education complete, DC therapy    Frequency     Barriers to discharge        Co-evaluation               AM-PAC PT "6 Clicks" Mobility  Outcome  Measure Help needed turning from your back to your side while in a flat bed without using bedrails?: None Help needed moving from lying on your back to sitting on the side of a flat bed without using bedrails?: None Help needed moving to and from a bed to a chair (including a wheelchair)?: None Help needed standing up from a chair using your arms (e.g., wheelchair or bedside chair)?: None Help needed to walk in hospital room?: None Help needed climbing 3-5 steps with a railing? : A Little 6 Click Score: 23    End of Session   Activity Tolerance: Patient tolerated treatment well Patient left: in bed;with call bell/phone within reach Nurse Communication: Mobility status PT Visit Diagnosis: Difficulty in walking, not elsewhere classified (R26.2)    Time: 1287-8676 PT Time Calculation (min) (ACUTE ONLY): 15 min   Charges:   PT Evaluation $PT Eval Moderate Complexity: [place], PT, DPT Acute Rehabilitation Services Pager [phone] Office [phone]   Deno Etienne 02/05/2021, 12:46 PM

## 2021-02-05 NOTE — Progress Notes (Signed)
Cardiology Progress Note  Patient ID: Keith [name] MRN: [id number] DOB: 20-Dec-1966 Date of Encounter: 02/05/2021  Primary Cardiologist: Evalina Field, MD  Subjective   Chief Complaint: Feeling better.  HPI: Diuresis is slowed down.  Shortness of breath improved.  Still not back to baseline.  ROS:  All other ROS reviewed and negative. Pertinent positives noted in the HPI.     Inpatient Medications  Scheduled Meds: . carvedilol  6.25 mg Oral BID WC  . losartan  25 mg Oral Daily  . pantoprazole  40 mg Oral Daily  . sodium chloride flush  3 mL Intravenous Q12H  . spironolactone  25 mg Oral Daily  . torsemide  20 mg Oral Daily   Continuous Infusions: [name] Morphine INTRATHECAL pump     PRN Meds: acetaminophen, ALPRAZolam, HYDROmorphone, metoprolol tartrate, ondansetron **OR** ondansetron (ZOFRAN) IV   Vital Signs   Vitals:   02/04/21 1909 02/05/21 0004 02/05/21 0403 02/05/21 0835  BP: (!) 146/80 126/83 115/83 111/83  Pulse: 80 86 87 87  Resp: 17 17 15 17   Temp: 98.5 F (36.9 C) 98 F (36.7 C) 98.9 F (37.2 C) 98.3 F (36.8 C)  TempSrc: Oral Oral Oral Oral  SpO2: 100% 100% 97% 100%  Weight:   75.8 kg   Height:   6\' 4"  (1.93 m)     Intake/Output Summary (Last 24 hours) at 02/05/2021 0956 Last data filed at 02/05/2021 0900 Gross per 24 hour  Intake 1400 ml  Output 2425 ml  Net -1025 ml   Last 3 Weights 02/05/2021 02/04/2021 02/02/2021  Weight (lbs) 167 lb 1.6 oz 177 lb 0.5 oz 180 lb 1.9 oz  Weight (kg) 75.796 kg 80.3 kg 81.7 kg  Some encounter information is confidential and restricted. Go to Review Flowsheets activity to see all data.      Telemetry  Overnight telemetry shows normal sinus rhythm with heart rate in the 70s, which I personally reviewed.   ECG  The most recent ECG shows sinus tachycardia heart rate 102, nonspecific ST-T changes, which I personally reviewed.   Physical Exam   Vitals:   02/04/21 1909 02/05/21 0004 02/05/21 0403 02/05/21 0835   BP: (!) 146/80 126/83 115/83 111/83  Pulse: 80 86 87 87  Resp: 17 17 15 17   Temp: 98.5 F (36.9 C) 98 F (36.7 C) 98.9 F (37.2 C) 98.3 F (36.8 C)  TempSrc: Oral Oral Oral Oral  SpO2: 100% 100% 97% 100%  Weight:   75.8 kg   Height:   6\' 4"  (1.93 m)      Intake/Output Summary (Last 24 hours) at 02/05/2021 0956 Last data filed at 02/05/2021 0900 Gross per 24 hour  Intake 1400 ml  Output 2425 ml  Net -1025 ml    Last 3 Weights 02/05/2021 02/04/2021 02/02/2021  Weight (lbs) 167 lb 1.6 oz 177 lb 0.5 oz 180 lb 1.9 oz  Weight (kg) 75.796 kg 80.3 kg 81.7 kg  Some encounter information is confidential and restricted. Go to Review Flowsheets activity to see all data.    Body mass index is 20.34 kg/m.  General: Well nourished, well developed, in no acute distress Head: Atraumatic, normal size  Eyes: PEERLA, EOMI  Neck: Supple, JVD 7 to 8 cm of water Endocrine: No thryomegaly Cardiac: Normal S1, S2; RRR; no murmurs, rubs, or gallops Lungs: Crackles of the lung bases Abd: Soft, nontender, no hepatomegaly  Ext: No edema, pulses 2+ Musculoskeletal: No deformities, BUE and BLE strength normal and equal Skin:  Warm and dry, no rashes   Neuro: Alert and oriented to person, place, time, and situation, CNII-XII grossly intact, no focal deficits  Psych: Normal mood and affect   Labs  High Sensitivity Troponin:   Recent Labs  Lab 01/31/21 0621 01/31/21 0920  TROPONINIHS 55* 29*     Cardiac EnzymesNo results for input(s): TROPONINI in the last 168 hours. No results for input(s): TROPIPOC in the last 168 hours.  Chemistry Recent Labs  Lab 01/31/21 0606 02/01/21 0459 02/02/21 0600 02/03/21 0536 02/04/21 0541  NA 139 140 140 141 143  K 5.0 4.3 4.2 4.5 4.7  CL 104 104 103 104 102  CO2 26 27 28 29  32  GLUCOSE 125* 111* 109* 109* 107*  BUN 34* 32* 30* 27* 27*  CREATININE 1.80* 1.63* 1.56* 1.60* 1.62*  CALCIUM 8.2* 8.4* 8.0* 7.8* 8.1*  PROT 5.5* 6.2*  --   --   --   ALBUMIN 2.2*  2.4*  --   --   --   AST 14* 16  --   --   --   ALT 13 13  --   --   --   ALKPHOS 63 66  --   --   --   BILITOT 0.6 0.6  --   --   --   GFRNONAA 44* 50* 53* 51* 50*  ANIONGAP 9 9 9 8 9     Hematology Recent Labs  Lab 02/02/21 0600 02/03/21 0536 02/04/21 0541  WBC 4.9 5.0 5.6  RBC 2.15* 2.40* 2.39*  HGB 6.7* 7.6* 7.6*  HCT 23.6* 26.2* 25.7*  MCV 109.8* 109.2* 107.5*  MCH 31.2 31.7 31.8  MCHC 28.4* 29.0* 29.6*  RDW 21.2* 20.8* 19.5*  PLT 146* 147* 151   BNP Recent Labs  Lab 01/31/21 0606  BNP 341.5*    DDimer  Recent Labs  Lab 01/31/21 0606  DDIMER 2.80*     Radiology  No results found.  Cardiac Studies  TTE 02/01/2021 1. Left ventricular ejection fraction, by estimation, is 40 to 45%. The  left ventricle has mild to moderately decreased function. The left  ventricle demonstrates global hypokinesis. There is mild left ventricular  hypertrophy. Left ventricular diastolic  parameters are consistent with Grade I diastolic dysfunction (impaired  relaxation).  2. Right ventricular systolic function is normal. The right ventricular  size is normal. Tricuspid regurgitation signal is inadequate for assessing  PA pressure.  3. Mild diastolic collapse.  4. A small pericardial effusion is present. The pericardial effusion is  circumferential. There is no evidence of cardiac tamponade. Large pleural  effusion in both left and right lateral regions.  5. The mitral valve is grossly normal. Trivial mitral valve  regurgitation.  6. The aortic valve is tricuspid. Aortic valve regurgitation is not  visualized.  7. The inferior vena cava is dilated in size with <50% respiratory  variability, suggesting right atrial pressure of 15 mmHg.   Patient Profile  Keith [name] is a 55 y.o. male with 55 year old male with history of stage IV renal cell cancer, pulmonary embolism, recurrent GI bleeding, diabetes who was admitted on 01/31/2021 generalized weakness shortness of  breath.  He was found to be grossly volume overloaded with acute decompensated systolic heart failure.  Course is also been complicated by recurrent anemia.   Assessment & Plan   1.  New onset systolic heart failure, EF 40-45%/anasarca --4.5 L since admission.  Volume overload related to new cardiomyopathy as well as hypoalbuminemia. -Appears euvolemic today.  Stop Lasix.  Start torsemide 20 mg daily. -No chest pain reported.  EF shows global hypokinesis.  Given profound anemia and continued bleeding he is not a candidate for left heart catheterization.  He has no chest pain and is doing well.  I would recommend medical management for his cardiomyopathy. -Increase Coreg to 12.5 mg twice daily.  On losartan 25 mg daily.  On Aldactone 25 mg daily.  Kidney function has been stable on this.  Would recommend close monitoring given CKD.  2.  Elevated troponin/non-MI troponin elevation in the setting of decompensated systolic heart failure -Troponin minimally elevated and flat.  Inconsistent with MI.  No chest pain reported.  No further treatment needed.  He is not a candidate for aspirin or any other antiplatelet agents.  Not a candidate for anticoagulation either.  3.  Recurrent anemia -Likely has had transfusion circulatory overload in setting of frequent transfusions.  Would recommend he take his torsemide with any transfusions in the future.  4.  Subclinical hypothyroidism -TSH 8.8.  Free T4 within normal limits.  Hospital medicine has plans to recheck this in 4 to 6 weeks  For questions or updates, please contact [place] Please consult [email or web address] for contact info under   Time Spent with Patient: I have spent a total of 25 minutes with patient reviewing hospital notes, telemetry, EKGs, labs and examining the patient as well as establishing an assessment and plan that was discussed with the patient.  > 50% of time was spent in direct patient care.    Signed, Addison Naegeli. Audie Box, Platter  02/05/2021 9:56 AM

## 2021-02-06 DIAGNOSIS — E43 Unspecified severe protein-calorie malnutrition: Secondary | ICD-10-CM | POA: Insufficient documentation

## 2021-02-06 DIAGNOSIS — R0602 Shortness of breath: Secondary | ICD-10-CM | POA: Diagnosis not present

## 2021-02-06 LAB — BASIC METABOLIC PANEL
Anion gap: 8 (ref 5–15)
BUN: 29 mg/dL — ABNORMAL HIGH (ref 6–20)
CO2: 32 mmol/L (ref 22–32)
Calcium: 8.3 mg/dL — ABNORMAL LOW (ref 8.9–10.3)
Chloride: 98 mmol/L (ref 98–111)
Creatinine, Ser: 1.77 mg/dL — ABNORMAL HIGH (ref 0.61–1.24)
GFR, Estimated: 45 mL/min — ABNORMAL LOW (ref >60)
Glucose, Bld: 102 mg/dL — ABNORMAL HIGH (ref 70–99)
Potassium: 4.4 mmol/L (ref 3.5–5.1)
Sodium: 138 mmol/L (ref 135–145)

## 2021-02-06 LAB — CBC WITH DIFFERENTIAL/PLATELET
Abs Immature Granulocytes: 0.04 10*3/uL (ref 0.00–0.07)
Basophils Absolute: 0 10*3/uL (ref 0.0–0.1)
Basophils Relative: 1 %
Eosinophils Absolute: 0.3 10*3/uL (ref 0.0–0.5)
Eosinophils Relative: 5 %
HCT: 25.3 % — ABNORMAL LOW (ref 39.0–52.0)
Hemoglobin: 7.8 g/dL — ABNORMAL LOW (ref 13.0–17.0)
Immature Granulocytes: 1 %
Lymphocytes Relative: 15 %
Lymphs Abs: 0.9 10*3/uL (ref 0.7–4.0)
MCH: 31.8 pg (ref 26.0–34.0)
MCHC: 30.8 g/dL (ref 30.0–36.0)
MCV: 103.3 fL — ABNORMAL HIGH (ref 80.0–100.0)
Monocytes Absolute: 0.9 10*3/uL (ref 0.1–1.0)
Monocytes Relative: 14 %
Neutro Abs: 3.9 10*3/uL (ref 1.7–7.7)
Neutrophils Relative %: 64 %
Platelets: 165 10*3/uL (ref 150–400)
RBC: 2.45 MIL/uL — ABNORMAL LOW (ref 4.22–5.81)
RDW: 19 % — ABNORMAL HIGH (ref 11.5–15.5)
WBC: 6 10*3/uL (ref 4.0–10.5)
nRBC: 0 % (ref 0.0–0.2)

## 2021-02-06 LAB — GLUCOSE, CAPILLARY
Glucose-Capillary: 78 mg/dL (ref 70–99)
Glucose-Capillary: 132 mg/dL — ABNORMAL HIGH (ref 70–99)

## 2021-02-06 LAB — MAGNESIUM: Magnesium: 1.8 mg/dL (ref 1.7–2.4)

## 2021-02-06 MED ORDER — CARVEDILOL 12.5 MG PO TABS: 12.5000 mg | ORAL_TABLET | Freq: Two times a day (BID) | ORAL | 0 refills | Status: DC

## 2021-02-06 MED ORDER — MAGNESIUM OXIDE 400 MG PO CAPS: 400.0000 mg | ORAL_CAPSULE | Freq: Every day | ORAL | 0 refills | Status: DC

## 2021-02-06 MED ORDER — SPIRONOLACTONE 25 MG PO TABS: 25.0000 mg | ORAL_TABLET | Freq: Every day | ORAL | 0 refills | Status: DC

## 2021-02-06 MED ORDER — FUROSEMIDE 20 MG PO TABS
20.0000 mg | ORAL_TABLET | Freq: Every day | ORAL | 1 refills | Status: DC
Start: 2021-02-06 — End: 2021-02-13

## 2021-02-06 MED ORDER — LOSARTAN POTASSIUM 25 MG PO TABS: 25.0000 mg | ORAL_TABLET | Freq: Every day | ORAL | 0 refills | Status: DC

## 2021-02-06 NOTE — Consult Note (Signed)
   [facility] Inpatient Consult   02/06/2021  DENT PLANTZ November 24, 1966 [id number]  [facility] [ACO] Patient: [employer] Medicare  PCP: [name], MD is listed to do the [facility] calls and follow up  Patient evaluated for community based chronic disease management services with [place] [facility] as a benefit of patient's [employer]. Spoke with patient at bedside phone to explain [place] Management services.  Patient was in the midst of getting an EKG done. Patient for potential transition to home today. Explained follow up is telephonic with [place] Coordinator.  Plan: Patient will receive post hospital discharge call and for assessments fo community needs for care/disease management.  Also,  Inpatient Transition of Care [TOC] Case Manager aware that [place] Management following.   Of note, [facility] Care Management services does not replace or interfere with any services that are arranged by inpatient Transition of Care [TOC] team     For additional questions or referrals please contact:    Natividad Brood, RN BSN [facility] Liaison  [phone] business mobile phone Toll free office [phone]  Fax number: [phone] [email or web address]  [email or web address]

## 2021-02-06 NOTE — TOC Initial Note (Signed)
Transition of Care [facility]) - Initial/Assessment Note    Patient Details  Name: Keith [name] MRN: [id number] Date of Birth: 1966-05-02  Transition of Care [facility]) CM/SW Contact:    Bethena Roys, RN Phone Number: 02/06/2021, 12:32 PM  Clinical Narrative: Risk for readmission assessment completed. Prior to arrival the patient was from home with the support of the spouse. Plan will be to return home. Case Manager discussed home needs with patient and patient has declined home health services (RN & Aide). Spouse was in the room at the bedside and is aware of the plan of care. [facility] [place] and gets [facility] without any issues. Patient has a primary care provider. Wife will transport patient home via private vehicle. No further needs identified from Case Manager at this time.                  Expected Discharge Plan: Home/Self Care Barriers to Discharge: No Barriers Identified   Patient Goals and CMS Choice Patient states their goals for this hospitalization and ongoing recovery are:: to return home   Choice offered to / list presented to : Patient  Expected Discharge Plan and Services Expected Discharge Plan: Home/Self Care In-house Referral: NA Discharge Planning Services: CM Consult Post Acute Care Choice: NA Living arrangements for the past 2 months: Single Family Home Expected Discharge Date: 02/06/21               DME Arranged: N/A DME Agency: NA       HH Arranged: Refused [place] Agency: NA        Prior Living Arrangements/Services Living arrangements for the past 2 months: Single Family Home Lives with:: Spouse Patient language and need for interpreter reviewed:: Yes Do you feel safe going back to the place where you live?: Yes      Need for Family Participation in Patient Care: Yes (Comment) Care giver support system in place?: Yes (comment)   Criminal Activity/Legal Involvement Pertinent to Current Situation/Hospitalization: No - Comment as  needed  Activities of Daily Living Home Assistive Devices/Equipment: Eyeglasses,Other (Comment) (intrathecal pain pump) ADL Screening (condition at time of admission) Patient's cognitive ability adequate to safely complete daily activities?: Yes Is the patient deaf or have difficulty hearing?: No Does the patient have difficulty seeing, even when wearing glasses/contacts?: No Does the patient have difficulty concentrating, remembering, or making decisions?: No Patient able to express need for assistance with ADLs?: Yes Does the patient have difficulty dressing or bathing?: Yes Independently performs ADLs?: No Communication: Independent Dressing (OT): Needs assistance Is this a change from baseline?: Change from baseline, expected to last >3 days Grooming: Independent Feeding: Independent Bathing: Needs assistance Is this a change from baseline?: Change from baseline, expected to last >3 days Toileting: Needs assistance Is this a change from baseline?: Change from baseline, expected to last >3days In/Out Bed: Needs assistance Is this a change from baseline?: Change from baseline, expected to last >3 days Walks in Home: Needs assistance Is this a change from baseline?: Change from baseline, expected to last >3 days Does the patient have difficulty walking or climbing stairs?: Yes (secondary to shortness of breath and lower extremity swelling) Weakness of Legs: Both Weakness of Arms/Hands: None  Permission Sought/Granted Permission sought to share information with : Family Nature conservation officer    Emotional Assessment Appearance:: Appears stated age Attitude/Demeanor/Rapport: Engaged Affect (typically observed): Appropriate Orientation: : Oriented to Situation,Oriented to  Time,Oriented to Place,Oriented to Self Alcohol / Substance Use: Not  Applicable Psych Involvement: No (comment)  Admission diagnosis:  Shortness of breath [R06.02] Anasarca [R60.1] Anemia due to blood loss  [D50.0] S/P thoracentesis [Z98.890] AKI (acute kidney injury) ([place]) [N17.9] 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 Protein-calorie malnutrition, severe 02/06/2021  . Pleural effusion   . Shortness of breath 01/31/2021  . Anasarca 01/31/2021  . Angiodysplasia of colon with hemorrhage   . GI bleed 12/18/2020  . Occult GI bleeding   . Acute GI bleeding 02/29/2020  . Acute blood loss anemia   . Syncope 02/28/2020  . Orthostatic hypotension 02/28/2020  . UGIB (upper gastrointestinal bleed) 08/28/2019  . Acute upper GI bleed 08/28/2019  . Acute esophagitis   . Gastritis and gastroduodenitis   . Hematochezia   . 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 Symptomatic anemia   . Pulmonary embolus, left ([place])   . Depression 11/13/2015  . Benign essential HTN 11/13/2015  . Malnutrition of moderate degree 11/12/2015  . HCAP (healthcare-associated pneumonia) 11/11/2015  . Chest pain 11/11/2015  . Acute pulmonary embolism ([place]) 11/11/2015  . Metastasis to lung ([place]) 11/11/2015  . Right middle lobe, bilateral lower lobes pneumonia  11/11/2015  . Acute respiratory failure with hypoxia ([place]) 11/11/2015  . Chemotherapy induced thrombocytopenia 11/11/2015  . Antineoplastic chemotherapy induced anemia 11/11/2015  . Hyponatremia 11/11/2015  . Cancer related pain 11/11/2015  . Transaminitis 11/11/2015  . Diabetes mellitus with diabetic neuropathy, with long-term current use of insulin ([place]) 11/11/2015  . Therapeutic opioid-induced constipation (OIC) 11/11/2015  . Bone metastasis ([place]) 12/25/2014  . Polysubstance abuse-cocaine and THC. 04/25/2013  . Renal cancer ([place]) 08/12/2011   PCP:  [name], MD Pharmacy:   [id number] [place], [place] [address] Phone: [phone] Fax: [phone]     Readmission Risk  Interventions Readmission Risk Prevention Plan 02/06/2021  Transportation Screening Complete  Medication Review ([place]) Complete  PCP or Specialist appointment within 3-5 days of discharge Complete  HRI or [place] Complete  SW Recovery Care/Counseling Consult Complete  [place] Not Applicable  Some recent data might be hidden

## 2021-02-06 NOTE — Progress Notes (Signed)
Spoke to Dr Tawanna Solo, he states to proceed with discharge.

## 2021-02-06 NOTE — Progress Notes (Signed)
RN notified of 8 beats VT by CMT.  Patient asymptomatic, denies chest pain or dizziness. BP=141/94.  Dr.  Tawanna Solo states to get EKG and hold discharge for 2 hours.  Patient states he does not want to stay for and additional 2 hours.

## 2021-02-06 NOTE — Discharge Summary (Addendum)
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  He was complaining of dyspnea worse on exertion, dry cough, scrotal swelling as well as orthopnea.  He was seen by his oncologist on 01/23/2024 and was prescribed Lasix for generalized anasarca which did not improve his edema.  He was also seen by GI on 01/24/2021 with plan for outpatient video capsule study. Patient was admitted for the management of acute hypoxic respiratory failure, anasarca, symptomatic anemia.  He was given a unit of transfusion during this hospitalization.  He is being followed by PCCM, oncology, GI, cardiology during this hospitalization.  He underwent bedside thoracentesis on 2/11.  Echocardiogram showed combined systolic/diastolic congestive heart failure. Medications optimized. PT/OT evaluation done, no follow-up recommended. He is medically stable for discharge to home today.  Following problems were addressed during his hospitalization:  Acute hypoxic respiratory failure: Secondary to pleural effusion, heart failure.resolved.   Currently saturating fine on room air.  Acute combined systolic/diastolic congestive heart failure: Echocardiogram done on this admission showed EF of 40 to 45%, severe hypokinesis, grade 1 diastolic dysfunction.  Cardiology were following.Patient will be continued on Coreg, losartan,  Aldactone,lasix.   Underwent thoracentesis on 2/11 with removal of 500 cc of fluid. He will follow-up with cardiology as an outpatient.  Symptomatic anemia/history of GI bleed: Transfused with a unit of PRBC during this hospitalization.  He was following with GI as an outpatient.  Plan was for video capsule study.  GI following here,now signed off.  Recommended to stop Xarelto indefinitely. Currently hemoglobin stable, no active bleeding.  Metastatic cancer to the lung/bone: Status post radical nephrectomy and radiation therapy.  Oncology were following here. Has severe cancer-related pain and is on Dilaudid/morphine intrathecal pump.  Neurosurgery was consulted for pump to be refilled  History of PE: On Xarelto, now stopped  Mildly elevated troponin: Secondary to demand ischemia.  No chest pain.  No further intervention.  AKI on CKD stage IIIa: Baseline creatinine ranges from 1.2-1.4.  Creatinine 1.7 today .  Check BMP in a week. He follows with Dr. Alinda Money, urology  Subclinical hypothyroidism: TSH of 8. Free  T4 normal.  Recheck TFT in 4 to 6 weeks.  Debility/deconditioning: We have requested for PT/OT evaluation, no follow-up recommended.  Discharge Diagnoses:  Principal Problem:   Shortness of breath Active Problems:   Renal cancer ([facility])   Metastasis to lung ([facility])   Benign essential HTN   Symptomatic anemia   Pulmonary embolus, left ([facility])   Metastasis from malignant tumor of kidney ([facility])   Acute GI bleeding   Acute blood loss anemia   Anasarca   Pleural effusion   Protein-calorie malnutrition, severe    Discharge Instructions  Discharge Instructions    Diet - low sodium heart healthy   Complete by: As directed    Discharge instructions   Complete by: As directed    1) Please follow up with your PCP in a week. Do  BMP and CBC  tests during the  follow-up 2)take prescribed medications as instructed 3)Follow up with cardiology as an outpatient. You will be called  for follow-up appointment. 4)Please follow-up with your urologist and oncologist as an outpatient.   Increase activity slowly   Complete by: As directed    No wound care   Complete by: As directed      Allergies as of 02/06/2021      Reactions   Ceftriaxone Hives   Hydrocodone Swelling      Medication List    STOP taking these medications   amLODipine 5 MG tablet Commonly known as: NORVASC   Xarelto 20 MG Tabs tablet Generic drug: rivaroxaban     TAKE these medications   acetaminophen 325 MG tablet Commonly known as: TYLENOL Take 650 mg by mouth every 6 (six) hours as needed.   ALPRAZolam 1 MG tablet Commonly known as: XANAX TAKE 1 TABLET BY MOUTH THREE TIMES A DAY AS NEEDED FOR ANXIETY What changed: See the new instructions.   B-12 1000 MCG Tabs Take 1 tablet (1,000 mcg total) by mouth daily.   carvedilol 12.5 MG tablet Commonly known as: COREG Take 1 tablet (12.5 mg total) by mouth 2 (two) times daily with a meal.   diphenhydrAMINE 25 MG tablet Commonly known as: BENADRYL Take 25 mg by mouth every 6 (six) hours as needed for allergies.   ferrous sulfate 325 (65 FE) MG tablet TAKE 1 TABLET BY MOUTH EVERY DAY WITH BREAKFAST What changed: See the new instructions.   furosemide 20 MG tablet Commonly known as: LASIX Take 1 tablet (20 mg total) by mouth daily.   HYDROmorphone 8 MG tablet Commonly known as: DILAUDID Take 1 tablet (8 mg total) by mouth every 4 (four) hours as needed for severe pain. What changed: reasons to take this   losartan 25 MG tablet Commonly known as: COZAAR Take 1 tablet (25 mg total) by mouth daily. Start taking on: February 07, 2021   Magnesium Oxide 400 MG Caps Take 1 capsule (400 mg total) by mouth daily.   ondansetron 4 MG tablet Commonly known as: ZOFRAN Take one tablet every morning.  Can take one tablet later in the day on an as needed basis What changed:   how much to take  how to take this  when to take  this  additional instructions   PAIN MANAGEMENT INTRATHECAL (IT) PUMP 1 each by Intrathecal route continuous. Intrathecal (IT) medication:   Morphine.   pantoprazole 40 MG tablet Commonly known as: PROTONIX Take 1 tablet (40 mg total) by mouth daily.   spironolactone 25 MG tablet Commonly known as: ALDACTONE Take 1 tablet (25 mg total) by mouth daily. Start taking on: February 07, 2021       Follow-up Information    [name], MD. Schedule an appointment as soon as possible for a visit in 1 week(s).   Specialty: Internal Medicine Contact information: [address] [place] [address] [phone]        Geralynn Rile, MD. Go to.   Specialties: Internal Medicine, Cardiology, Radiology Why: Hospital follow-up with Dr. Audie Box scheduled for 02/27/2021 at 10:20am. Please arrive 15 minutes early for check-in. If this date/time does not work for you, please call our office to reschedule. Contact information: [place] [address] [phone]              Allergies  Allergen Reactions  . Ceftriaxone Hives  . Hydrocodone Swelling    Consultations:  Cardiology, GI, neurosurgery  Procedures/Studies: CT ABDOMEN PELVIS W WO CONTRAST  Result Date: 01/31/2021 CLINICAL DATA:  Restaging metastatic left renal cancer, prior nephrectomy. Discontinuation of oral chemotherapy. Shortness of breath. EXAM: CT CHEST WITH CONTRAST CT ABDOMEN AND PELVIS WITH AND WITHOUT CONTRAST TECHNIQUE: Multidetector CT imaging of the chest was performed during intravenous contrast administration. Multidetector CT imaging of the abdomen and pelvis was performed following the standard protocol before and during bolus administration of intravenous contrast. CONTRAST:  25mL OMNIPAQUE IOHEXOL 300 MG/ML  SOLN COMPARISON:  11/20/2020 FINDINGS: CT CHEST FINDINGS Cardiovascular: No significant thoracic atherosclerosis. Please note that today's exam was not  performed as a CT angiogram and is not considered highly sensitive for pulmonary embolus. Stable small pericardial effusion. Mediastinum/Nodes: 1.3 by 0.9 cm hypodense nodule anteriorly in the right thyroid lobe on image 6 of series 12. Not clinically significant; no follow-up imaging recommended (ref: J Am Coll Radiol. 2015 Feb;12(2): 143-50). There is background stranding in the mediastinum which matches the background stranding in the subcutaneous tissues and likely reflects third spacing of fluid. Lungs/Pleura: Predominantly left lower lobe perihilar mass measures 4.3 by 3.4 cm on image 50 of series 12, previously 3.1 by 3.2 cm by my measurements. Right middle lobe and right upper lobe peribronchovascular mass measures 2.4 cm along its short axis, previously 2.1 cm. Stable thick bandlike scarring with volume loss in the right upper lobe. Underlying emphysema. Scattered scarring in the lungs. There is some hazy ground-glass opacity in the lungs which is increased from prior and could be a subtle manifestation of edema or third spacing of fluid. Moderate to large left pleural effusion has increased from prior. Small right pleural effusion appear stable. Musculoskeletal: Thoracic spondylosis. Stable small anterior sclerotic lesion in the T2 vertebral body. Anomalous medial left second rib. Observe 0 catheter noted terminating at about the T4 level posteriorly. CT ABDOMEN AND PELVIS FINDINGS Hepatobiliary: No well delineated hepatic lesions are observed. Gallbladder unremarkable. Pancreas: Dorsal pancreatic duct dilatation extending to a pancreatic body mass with enhancing portion of this mass measuring 3.6 by 2.0 cm on image 41 of series 7, previously 3.6 by 1.9 cm. Along the anterior margin of the pancreatic head, a 3.1 by 2.3 cm mass is present and previously measured 2.8 by 2.4 cm. The pancreatic lesions are roughly stable. Questionable third small pancreatic nodule along the pancreatic head on image 54 of  series 7 measuring 0.9 cm in diameter, stable. Possible fourth pancreatic nodule along the anterior portion of the pancreatic tail measuring 1.0 cm in diameter, stable. Spleen: Unremarkable Adrenals/Urinary Tract: Left nephrectomy. Adrenal glands unremarkable. The right kidney appears unremarkable. No urinary tract calculi are observed. Urinary bladder unremarkable. Stomach/Bowel: No dilated bowel. Similar appearance of mild diffuse rectal wall thickening compared to previous, possibly incidental. Vascular/Lymphatic: Aortoiliac atherosclerotic vascular disease. No pathologic adenopathy. Reproductive: Unremarkable Other: Diffuse subcutaneous and mesenteric edema suggesting third spacing of fluid. Trace free pelvic fluid, improved from prior. Diffuse stranding in the perirectal space. Musculoskeletal: Left lower abdominal wall infusion pump. Necks chloride akin lucent lesion of the right sacral ala probably representing a metastatic lesion and not appreciably changed from prior. Mild sclerosis along the SI joints. Right proximal femur ORIF, with a lucency in the right proximal femur shown on image 76 of series 13 and also noted on prior exam. Heterotopic ossification above the greater trochanter. Sharply defined lytic lesion of the right inferior pubic ramus measuring 2.4 by 1.3 cm on image 217 of series 12 with sclerotic margination, previously 2.2 by 1.3  cm. IMPRESSION: 1. Mild enlargement of the left lower lobe perihilar mass, and of the right middle lobe/right upper lobe peribronchovascular mass. Increased left pleural effusion, now moderate to large. Stable small right pleural effusion. Stable small pericardial effusion. 2. Diffuse subcutaneous, mesenteric, and mediastinal edema compatible with third spacing of fluid. There is some hazy density in the lungs which also may be due to noncardiogenic edema. 3. Stable appearance of multiple pancreatic masses. 4. Stable osseous manifestations of metastatic disease,  with the largest lesion in the right sacrum. 5. Emphysema and aortic atherosclerosis. Aortic Atherosclerosis (ICD10-I70.0) and Emphysema (ICD10-J43.9). Electronically Signed   By: Van Clines M.D.   On: 01/31/2021 10:56   DG Chest 2 View  Result Date: 01/31/2021 CLINICAL DATA:  Shortness of breath.  Metastatic renal cell cancer. EXAM: CHEST - 2 VIEW COMPARISON:  06/30/2020 FINDINGS: Interval progression of spiculated nodular opacity in the suprahilar right lung with progressive right infrahilar opacity in this patient with known metastatic renal cell carcinoma. New left base collapse/consolidation with small left pleural effusion. Tiny right pleural effusion is similar to prior. The cardio pericardial silhouette is enlarged. The visualized bony structures of the thorax show no acute abnormality. IMPRESSION: 1. Interval progression of right suprahilar and right infrahilar pulmonary metastatic disease. 2. New left base collapse/consolidation with small left pleural effusion. Findings may be related to metastatic involvement or pneumonia. Electronically Signed   By: Misty Stanley M.D.   On: 01/31/2021 06:24   CT CHEST W CONTRAST  Result Date: 01/31/2021 CLINICAL DATA:  Restaging metastatic left renal cancer, prior nephrectomy. Discontinuation of oral chemotherapy. Shortness of breath. EXAM: CT CHEST WITH CONTRAST CT ABDOMEN AND PELVIS WITH AND WITHOUT CONTRAST TECHNIQUE: Multidetector CT imaging of the chest was performed during intravenous contrast administration. Multidetector CT imaging of the abdomen and pelvis was performed following the standard protocol before and during bolus administration of intravenous contrast. CONTRAST:  27mL OMNIPAQUE IOHEXOL 300 MG/ML  SOLN COMPARISON:  11/20/2020 FINDINGS: CT CHEST FINDINGS Cardiovascular: No significant thoracic atherosclerosis. Please note that today's exam was not performed as a CT angiogram and is not considered highly sensitive for pulmonary embolus.  Stable small pericardial effusion. Mediastinum/Nodes: 1.3 by 0.9 cm hypodense nodule anteriorly in the right thyroid lobe on image 6 of series 12. Not clinically significant; no follow-up imaging recommended (ref: J Am Coll Radiol. 2015 Feb;12(2): 143-50). There is background stranding in the mediastinum which matches the background stranding in the subcutaneous tissues and likely reflects third spacing of fluid. Lungs/Pleura: Predominantly left lower lobe perihilar mass measures 4.3 by 3.4 cm on image 50 of series 12, previously 3.1 by 3.2 cm by my measurements. Right middle lobe and right upper lobe peribronchovascular mass measures 2.4 cm along its short axis, previously 2.1 cm. Stable thick bandlike scarring with volume loss in the right upper lobe. Underlying emphysema. Scattered scarring in the lungs. There is some hazy ground-glass opacity in the lungs which is increased from prior and could be a subtle manifestation of edema or third spacing of fluid. Moderate to large left pleural effusion has increased from prior. Small right pleural effusion appear stable. Musculoskeletal: Thoracic spondylosis. Stable small anterior sclerotic lesion in the T2 vertebral body. Anomalous medial left second rib. Observe 0 catheter noted terminating at about the T4 level posteriorly. CT ABDOMEN AND PELVIS FINDINGS Hepatobiliary: No well delineated hepatic lesions are observed. Gallbladder unremarkable. Pancreas: Dorsal pancreatic duct dilatation extending to a pancreatic body mass with enhancing portion of this mass measuring 3.6  by 2.0 cm on image 41 of series 7, previously 3.6 by 1.9 cm. Along the anterior margin of the pancreatic head, a 3.1 by 2.3 cm mass is present and previously measured 2.8 by 2.4 cm. The pancreatic lesions are roughly stable. Questionable third small pancreatic nodule along the pancreatic head on image 54 of series 7 measuring 0.9 cm in diameter, stable. Possible fourth pancreatic nodule along the  anterior portion of the pancreatic tail measuring 1.0 cm in diameter, stable. Spleen: Unremarkable Adrenals/Urinary Tract: Left nephrectomy. Adrenal glands unremarkable. The right kidney appears unremarkable. No urinary tract calculi are observed. Urinary bladder unremarkable. Stomach/Bowel: No dilated bowel. Similar appearance of mild diffuse rectal wall thickening compared to previous, possibly incidental. Vascular/Lymphatic: Aortoiliac atherosclerotic vascular disease. No pathologic adenopathy. Reproductive: Unremarkable Other: Diffuse subcutaneous and mesenteric edema suggesting third spacing of fluid. Trace free pelvic fluid, improved from prior. Diffuse stranding in the perirectal space. Musculoskeletal: Left lower abdominal wall infusion pump. Necks chloride akin lucent lesion of the right sacral ala probably representing a metastatic lesion and not appreciably changed from prior. Mild sclerosis along the SI joints. Right proximal femur ORIF, with a lucency in the right proximal femur shown on image 76 of series 13 and also noted on prior exam. Heterotopic ossification above the greater trochanter. Sharply defined lytic lesion of the right inferior pubic ramus measuring 2.4 by 1.3 cm on image 217 of series 12 with sclerotic margination, previously 2.2 by 1.3 cm. IMPRESSION: 1. Mild enlargement of the left lower lobe perihilar mass, and of the right middle lobe/right upper lobe peribronchovascular mass. Increased left pleural effusion, now moderate to large. Stable small right pleural effusion. Stable small pericardial effusion. 2. Diffuse subcutaneous, mesenteric, and mediastinal edema compatible with third spacing of fluid. There is some hazy density in the lungs which also may be due to noncardiogenic edema. 3. Stable appearance of multiple pancreatic masses. 4. Stable osseous manifestations of metastatic disease, with the largest lesion in the right sacrum. 5. Emphysema and aortic atherosclerosis. Aortic  Atherosclerosis (ICD10-I70.0) and Emphysema (ICD10-J43.9). Electronically Signed   By: Van Clines M.D.   On: 01/31/2021 10:56   DG CHEST PORT 1 VIEW  Result Date: 02/01/2021 CLINICAL DATA:  Status post left thoracentesis. EXAM: PORTABLE CHEST 1 VIEW COMPARISON:  CT chest and chest radiograph from January 31, 2021. FINDINGS: Right suprahilar and left infrahilar metastatic disease, better characterized on recent CT chest. Interval left thoracentesis with decreased size of a layering small left pleural effusion. Hazy left basilar opacity. No visible pneumothorax on this semi erect radiograph. Similar small right pleural effusion. Similar enlarged cardiac silhouette. IMPRESSION: 1. Interval left thoracentesis with decreased layering small left pleural effusion. Hazy left basilar opacity likely is secondary to layering pleural effusion and atelectasis. No visible pneumothorax on this semi erect radiograph. 2. Similar small right pleural effusion. 3. Right suprahilar and left infrahilar metastatic disease, better characterized on recent CT chest. Electronically Signed   By: Margaretha Sheffield MD   On: 02/01/2021 13:01   ECHOCARDIOGRAM COMPLETE  Result Date: 02/01/2021    ECHOCARDIOGRAM REPORT   Patient Name:   Keith [name] Date of Exam: 02/01/2021 Medical Rec #:  [id number]       Height:       75.0 in Accession #:    [id number]      Weight:       191.0 lb Date of Birth:  December 14, 1966       BSA:  2.152 m Patient Age:    56 years        BP:           127/67 mmHg Patient Gender: M               HR:           99 bpm. Exam Location:  Inpatient Procedure: 2D Echo, Cardiac Doppler and Color Doppler Indications:    Dyspnea R06.00  History:        Patient has prior history of Echocardiogram examinations, most                 recent 11/07/2015. Risk Factors:Diabetes. Cancer.  Sonographer:    Jonelle Sidle Dance Referring Phys: [id number] [place]  1. Left ventricular ejection fraction, by estimation,  is 40 to 45%. The left ventricle has mild to moderately decreased function. The left ventricle demonstrates global hypokinesis. There is mild left ventricular hypertrophy. Left ventricular diastolic parameters are consistent with Grade I diastolic dysfunction (impaired relaxation).  2. Right ventricular systolic function is normal. The right ventricular size is normal. Tricuspid regurgitation signal is inadequate for assessing PA pressure.  3. Mild diastolic collapse.  4. A small pericardial effusion is present. The pericardial effusion is circumferential. There is no evidence of cardiac tamponade. Large pleural effusion in both left and right lateral regions.  5. The mitral valve is grossly normal. Trivial mitral valve regurgitation.  6. The aortic valve is tricuspid. Aortic valve regurgitation is not visualized.  7. The inferior vena cava is dilated in size with <50% respiratory variability, suggesting right atrial pressure of 15 mmHg. FINDINGS  Left Ventricle: Left ventricular ejection fraction, by estimation, is 40 to 45%. The left ventricle has mild to moderately decreased function. The left ventricle demonstrates global hypokinesis. The left ventricular internal cavity size was normal in size. There is mild left ventricular hypertrophy. Left ventricular diastolic parameters are consistent with Grade I diastolic dysfunction (impaired relaxation). Indeterminate filling pressures. Right Ventricle: The right ventricular size is normal. No increase in right ventricular wall thickness. Right ventricular systolic function is normal. Tricuspid regurgitation signal is inadequate for assessing PA pressure. Left Atrium: Left atrial size was normal in size. Right Atrium: Mild diastolic collapse. Right atrial size was normal in size. Pericardium: A small pericardial effusion is present. The pericardial effusion is circumferential. There is diastolic collapse of the right atrial wall. There is no evidence of cardiac  tamponade. Mitral Valve: The mitral valve is grossly normal. Trivial mitral valve regurgitation. Tricuspid Valve: The tricuspid valve is grossly normal. Tricuspid valve regurgitation is trivial. Aortic Valve: The aortic valve is tricuspid. Aortic valve regurgitation is not visualized. Pulmonic Valve: The pulmonic valve was not well visualized. Pulmonic valve regurgitation is not visualized. Aorta: The aortic root and ascending aorta are structurally normal, with no evidence of dilitation. Venous: The inferior vena cava is dilated in size with less than 50% respiratory variability, suggesting right atrial pressure of 15 mmHg. IAS/Shunts: No atrial level shunt detected by color flow Doppler. Additional Comments: There is a large pleural effusion in both left and right lateral regions.  LEFT VENTRICLE PLAX 2D LVIDd:         5.00 cm  Diastology LVIDs:         4.44 cm  LV e' medial:    6.47 cm/s LV PW:         1.10 cm  LV E/e' medial:  12.0 LV IVS:  0.72 cm  LV e' lateral:   7.71 cm/s LVOT diam:     2.10 cm  LV E/e' lateral: 10.1 LV SV:         52 LV SV Index:   24 LVOT Area:     3.46 cm  RIGHT VENTRICLE             IVC RV Basal diam:  2.66 cm     IVC diam: 2.31 cm RV S prime:     15.90 cm/s TAPSE (M-mode): 2.1 cm LEFT ATRIUM         Index LA diam:    3.00 cm 1.39 cm/m  AORTIC VALVE LVOT Vmax:   99.30 cm/s LVOT Vmean:  64.500 cm/s LVOT VTI:    0.151 m  AORTA Ao Root diam: 3.70 cm Ao Asc diam:  3.40 cm MITRAL VALVE MV Area (PHT): 3.08 cm    SHUNTS MV Decel Time: 246 msec    Systemic VTI:  0.15 m MV E velocity: 77.70 cm/s  Systemic Diam: 2.10 cm MV A velocity: 98.00 cm/s MV E/A ratio:  0.79 Lyman Bishop MD Electronically signed by Lyman Bishop MD Signature Date/Time: 02/01/2021/12:52:38 PM    Final       Subjective: Patient seen and examined at the bedside this morning. Hemodynamically stable for discharge today  Discharge Exam: Vitals:   02/06/21 0947 02/06/21 1238  BP: (!) 155/79 (!) 141/94  Pulse:  72 68  Resp:  15  Temp:  98.5 F (36.9 C)  SpO2:  100%   Vitals:   02/06/21 0400 02/06/21 0747 02/06/21 0947 02/06/21 1238  BP: (!) 138/91 (!) 153/82 (!) 155/79 (!) 141/94  Pulse: 73 70 72 68  Resp: 18 12  15   Temp: 98 F (36.7 C) 98 F (36.7 C)  98.5 F (36.9 C)  TempSrc: Oral Oral  Oral  SpO2: 96% 98%  100%  Weight: 74 kg     Height:        General: Pt is alert, awake, not in acute distress Cardiovascular: RRR, S1/S2 +, no rubs, no gallops Respiratory: CTA bilaterally, no wheezing, no rhonchi Abdominal: Soft, NT, ND, bowel sounds + Extremities: Trace bilateral lower extremity  edema, no cyanosis    The results of significant diagnostics from this hospitalization (including imaging, microbiology, ancillary and laboratory) are listed below for reference.     Microbiology: Recent Results (from the past 240 hour(s))  Resp Panel by RT-PCR (Flu A&B, Covid) Nasopharyngeal Swab     Status: None   Collection Time: 01/31/21  5:47 AM   Specimen: Nasopharyngeal Swab; Nasopharyngeal(NP) swabs in vial transport medium  Result Value Ref Range Status   SARS Coronavirus 2 by RT PCR NEGATIVE NEGATIVE Final    Comment: (NOTE) SARS-CoV-2 target nucleic acids are NOT DETECTED.  The SARS-CoV-2 RNA is generally detectable in upper respiratory specimens during the acute phase of infection. The lowest concentration of SARS-CoV-2 viral copies this assay can detect is 138 copies/mL. A negative result does not preclude SARS-Cov-2 infection and should not be used as the sole basis for treatment or other patient management decisions. A negative result may occur with  improper specimen collection/handling, submission of specimen other than nasopharyngeal swab, presence of viral mutation(s) within the areas targeted by this assay, and inadequate number of viral copies(<138 copies/mL). A negative result must be combined with clinical observations, patient history, and  epidemiological information. The expected result is Negative.  Fact Sheet for Patients:  EntrepreneurPulse.com.au  Fact Sheet for  Healthcare Providers:  IncredibleEmployment.be  This test is no t yet approved or cleared by the [place] and  has been authorized for detection and/or diagnosis of SARS-CoV-2 by FDA under an Emergency Use Authorization (EUA). This EUA will remain  in effect (meaning this test can be used) for the duration of the COVID-19 declaration under Section 564(b)(1) of the Act, 21 U.S.C.section 360bbb-3(b)(1), unless the authorization is terminated  or revoked sooner.       Influenza A by PCR NEGATIVE NEGATIVE Final   Influenza B by PCR NEGATIVE NEGATIVE Final    Comment: (NOTE) The Xpert Xpress SARS-CoV-2/FLU/RSV plus assay is intended as an aid in the diagnosis of influenza from Nasopharyngeal swab specimens and should not be used as a sole basis for treatment. Nasal washings and aspirates are unacceptable for Xpert Xpress SARS-CoV-2/FLU/RSV testing.  Fact Sheet for Patients: EntrepreneurPulse.com.au  Fact Sheet for Healthcare Providers: IncredibleEmployment.be  This test is not yet approved or cleared by the [place] FDA and has been authorized for detection and/or diagnosis of SARS-CoV-2 by FDA under an Emergency Use Authorization (EUA). This EUA will remain in effect (meaning this test can be used) for the duration of the COVID-19 declaration under Section 564(b)(1) of the Act, 21 U.S.C. section 360bbb-3(b)(1), unless the authorization is terminated or revoked.  Performed at [facility], [place] [address] [place], [place] [address]   Body fluid culture (includes gram stain)     Status: None   Collection Time: 02/01/21 12:24 PM   Specimen: Pleural Fluid  Result Value Ref Range Status   Specimen Description   Final    PLEURAL LT Performed at  [place] [address], [place], [place] [address]    Special Requests   Final    NONE Performed at [facility], [place] [address], [place], [place] [address]    Gram Stain   Final    MODERATE WBC PRESENT,BOTH PMN AND MONONUCLEAR NO ORGANISMS SEEN    Culture   Final    NO GROWTH 3 DAYS Performed at [facility], [place] [address] [place], [place] [address]    Report Status 02/04/2021 FINAL  Final     Labs: BNP (last 3 results) Recent Labs    01/31/21 0606  BNP 818.2*   Basic Metabolic Panel: Recent Labs  Lab 02/02/21 0600 02/03/21 0536 02/04/21 0541 02/05/21 0932 02/06/21 0151 02/06/21 1326  NA 140 141 143 137 138  --   K 4.2 4.5 4.7 4.2 4.4  --   CL 103 104 102 98 98  --   CO2 28 29 32 31 32  --   GLUCOSE 109* 109* 107* 141* 102*  --   BUN 30* 27* 27* 25* 29*  --   CREATININE 1.56* 1.60* 1.62* 1.70* 1.77*  --   CALCIUM 8.0* 7.8* 8.1* 8.1* 8.3*  --   MG  --   --   --   --   --  1.8   Liver Function Tests: Recent Labs  Lab 01/31/21 0606 02/01/21 0459  AST 14* 16  ALT 13 13  ALKPHOS 63 66  BILITOT 0.6 0.6  PROT 5.5* 6.2*  ALBUMIN 2.2* 2.4*   No results for input(s): LIPASE, AMYLASE in the last 168 hours. No results for input(s): AMMONIA in the last 168 hours. CBC: Recent Labs  Lab 01/31/21 0635 01/31/21 1745 02/01/21 0459 02/02/21 0600 02/03/21 0536 02/04/21 0541 02/05/21 0932 02/06/21 0151  WBC  6.8  --  5.4 4.9 5.0 5.6 6.7 6.0  NEUTROABS 5.4  --  3.5  --   --   --   --  3.9  HGB 6.1*   < > 7.8* 6.7* 7.6* 7.6* 8.2* 7.8*  HCT 21.6*   < > 26.5* 23.6* 26.2* 25.7* 29.2* 25.3*  MCV 113.7*  --  109.1* 109.8* 109.2* 107.5* 109.0* 103.3*  PLT 178  --  160 146* 147* 151 172 165   < > = values in this interval not displayed.   Cardiac Enzymes: No results for input(s): CKTOTAL, CKMB, CKMBINDEX, TROPONINI in the last 168 hours. BNP: Invalid input(s): POCBNP CBG: Recent Labs  Lab 02/05/21 0015  02/05/21 1235 02/05/21 1610 02/06/21 0751 02/06/21 1239  GLUCAP 148* 75 75 78 132*   D-Dimer No results for input(s): DDIMER in the last 72 hours. Hgb A1c No results for input(s): HGBA1C in the last 72 hours. Lipid Profile No results for input(s): CHOL, HDL, LDLCALC, TRIG, CHOLHDL, LDLDIRECT in the last 72 hours. Thyroid function studies No results for input(s): TSH, T4TOTAL, T3FREE, THYROIDAB in the last 72 hours.  Invalid input(s): FREET3 Anemia work up No results for input(s): VITAMINB12, FOLATE, FERRITIN, TIBC, IRON, RETICCTPCT in the last 72 hours. Urinalysis    Component Value Date/Time   COLORURINE YELLOW 01/31/2021 0928   APPEARANCEUR CLEAR 01/31/2021 0928   LABSPEC 1.008 01/31/2021 0928   PHURINE 5.0 01/31/2021 0928   GLUCOSEU NEGATIVE 01/31/2021 0928   HGBUR SMALL (A) 01/31/2021 0928   BILIRUBINUR NEGATIVE 01/31/2021 0928   KETONESUR NEGATIVE 01/31/2021 0928   PROTEINUR 100 (A) 01/31/2021 0928   UROBILINOGEN 0.2 09/20/2015 1547   NITRITE NEGATIVE 01/31/2021 0928   LEUKOCYTESUR NEGATIVE 01/31/2021 0928   Sepsis Labs Invalid input(s): PROCALCITONIN,  WBC,  LACTICIDVEN Microbiology Recent Results (from the past 240 hour(s))  Resp Panel by RT-PCR (Flu A&B, Covid) Nasopharyngeal Swab     Status: None   Collection Time: 01/31/21  5:47 AM   Specimen: Nasopharyngeal Swab; Nasopharyngeal(NP) swabs in vial transport medium  Result Value Ref Range Status   SARS Coronavirus 2 by RT PCR NEGATIVE NEGATIVE Final    Comment: (NOTE) SARS-CoV-2 target nucleic acids are NOT DETECTED.  The SARS-CoV-2 RNA is generally detectable in upper respiratory specimens during the acute phase of infection. The lowest concentration of SARS-CoV-2 viral copies this assay can detect is 138 copies/mL. A negative result does not preclude SARS-Cov-2 infection and should not be used as the sole basis for treatment or other patient management decisions. A negative result may occur with   improper specimen collection/handling, submission of specimen other than nasopharyngeal swab, presence of viral mutation(s) within the areas targeted by this assay, and inadequate number of viral copies(<138 copies/mL). A negative result must be combined with clinical observations, patient history, and epidemiological information. The expected result is Negative.  Fact Sheet for Patients:  EntrepreneurPulse.com.au  Fact Sheet for Healthcare Providers:  IncredibleEmployment.be  This test is no t yet approved or cleared by the [place] FDA and  has been authorized for detection and/or diagnosis of SARS-CoV-2 by FDA under an Emergency Use Authorization (EUA). This EUA will remain  in effect (meaning this test can be used) for the duration of the COVID-19 declaration under Section 564(b)(1) of the Act, 21 U.S.C.section 360bbb-3(b)(1), unless the authorization is terminated  or revoked sooner.       Influenza A by PCR NEGATIVE NEGATIVE Final   Influenza B by PCR NEGATIVE NEGATIVE Final  Comment: (NOTE) The Xpert Xpress SARS-CoV-2/FLU/RSV plus assay is intended as an aid in the diagnosis of influenza from Nasopharyngeal swab specimens and should not be used as a sole basis for treatment. Nasal washings and aspirates are unacceptable for Xpert Xpress SARS-CoV-2/FLU/RSV testing.  Fact Sheet for Patients: EntrepreneurPulse.com.au  Fact Sheet for Healthcare Providers: IncredibleEmployment.be  This test is not yet approved or cleared by the [place] FDA and has been authorized for detection and/or diagnosis of SARS-CoV-2 by FDA under an Emergency Use Authorization (EUA). This EUA will remain in effect (meaning this test can be used) for the duration of the COVID-19 declaration under Section 564(b)(1) of the Act, 21 U.S.C. section 360bbb-3(b)(1), unless the authorization is terminated  or revoked.  Performed at [facility], [place] [address], [place], [place] [address]   Body fluid culture (includes gram stain)     Status: None   Collection Time: 02/01/21 12:24 PM   Specimen: Pleural Fluid  Result Value Ref Range Status   Specimen Description   Final    PLEURAL LT Performed at [place] [address] [place], [place] [address]    Special Requests   Final    NONE Performed at [facility], [place] [address], [place], [place] [address]    Gram Stain   Final    MODERATE WBC PRESENT,BOTH PMN AND MONONUCLEAR NO ORGANISMS SEEN    Culture   Final    NO GROWTH 3 DAYS Performed at [facility], [place] [address] [place],  [address]    Report Status 02/04/2021 FINAL  Final    Please note: You were cared for by a hospitalist during your hospital stay. Once you are discharged, your primary care physician will handle any further medical issues. Please note that NO REFILLS for any discharge medications will be authorized once you are discharged, as it is imperative that you return to your primary care physician (or establish a relationship with a primary care physician if you do not have one) for your post hospital discharge needs so that they can reassess your need for medications and monitor your lab values.    Time coordinating discharge: 40 minutes  SIGNED:   Shelly Coss, MD  [facility] 02/06/2021, 1:59 PM Pager [phone]  If 7PM-7AM, please contact night-coverage [email or web address] Password [email or web address]

## 2021-02-06 NOTE — Evaluation (Signed)
Occupational Therapy Evaluation Patient Details Name: Keith [name] MRN: [id number] DOB: 1966-07-15 Today's Date: 02/06/2021    History of Present Illness Keith [name] is a 55 y.o. male with 55 year old male with history of stage IV renal cell cancer, pulmonary embolism, recurrent GI bleeding, diabetes who was admitted on 01/31/2021 generalized weakness shortness of breath.  He was found to be grossly volume overloaded with acute decompensated systolic heart failure   Clinical Impression   Pt is functioning independently in ADL and ADL transfers. Has all necessary DME at home. Instructed in home safety with pt verbalizing understanding.  No further OT needs.     Follow Up Recommendations  No OT follow up    Equipment Recommendations  None recommended by OT    Recommendations for Other Services       Precautions / Restrictions Precautions Precautions: None      Mobility Bed Mobility Overal bed mobility: Independent                  Transfers Overall transfer level: Independent Equipment used: None                  Balance                                           ADL either performed or assessed with clinical judgement   ADL Overall ADL's : Modified independent                                       General ADL Comments: instructed pt and wife in home safety, fall prevention     Vision Baseline Vision/History: No visual deficits       Perception     Praxis      Pertinent Vitals/Pain Pain Assessment: No/denies pain     Hand Dominance Right   Extremity/Trunk Assessment Upper Extremity Assessment Upper Extremity Assessment: Overall WFL for tasks assessed   Lower Extremity Assessment Lower Extremity Assessment: Overall WFL for tasks assessed   Cervical / Trunk Assessment Cervical / Trunk Assessment: Normal   Communication Communication Communication: No difficulties   Cognition Arousal/Alertness:  Awake/alert Behavior During Therapy: WFL for tasks assessed/performed Overall Cognitive Status: Within Functional Limits for tasks assessed                                     General Comments       Exercises     Shoulder Instructions      Home Living Family/patient expects to be discharged to:: Private residence Living Arrangements: Spouse/significant other Available Help at Discharge: Family;Available PRN/intermittently Type of Home: Mobile home Home Access: Stairs to enter Entrance Stairs-Number of Steps: 1   Home Layout: One level     Bathroom Shower/Tub: Occupational psychologist: Handicapped height     Home Equipment: Shower seat;Grab bars - toilet          Prior Functioning/Environment Level of Independence: Independent        Comments: Does not work. Enjoys fishing and yard work        OT Problem List:        OT Treatment/Interventions:      OT Goals(Current goals can  be found in the care plan section) Acute Rehab OT Goals Patient Stated Goal: return to normal activities  OT Frequency:     Barriers to D/C:            Co-evaluation              AM-PAC OT "6 Clicks" Daily Activity     Outcome Measure Help from another person eating meals?: None Help from another person taking care of personal grooming?: None Help from another person toileting, which includes using toliet, bedpan, or urinal?: None Help from another person bathing (including washing, rinsing, drying)?: None Help from another person to put on and taking off regular upper body clothing?: None Help from another person to put on and taking off regular lower body clothing?: None 6 Click Score: 24   End of Session    Activity Tolerance: Patient tolerated treatment well Patient left: in bed;with call bell/phone within reach;with family/visitor present  OT Visit Diagnosis: Muscle weakness (generalized) (M62.81)                Time: 8588-5027 OT Time  Calculation (min): 20 min Charges:  OT General Charges $OT Visit: 1 Visit OT Evaluation $OT Eval Low Complexity: 1 Low  Keith [name], OTR/L Acute Rehabilitation Services Pager: [phone] Office: [phone]  Keith [name] 02/06/2021, 10:21 AM

## 2021-02-12 ENCOUNTER — Other Ambulatory Visit: Payer: Self-pay | Admitting: Oncology

## 2021-02-12 DIAGNOSIS — D649 Anemia, unspecified: Secondary | ICD-10-CM | POA: Diagnosis not present

## 2021-02-12 DIAGNOSIS — R251 Tremor, unspecified: Secondary | ICD-10-CM | POA: Diagnosis not present

## 2021-02-12 DIAGNOSIS — J439 Emphysema, unspecified: Secondary | ICD-10-CM | POA: Diagnosis not present

## 2021-02-12 DIAGNOSIS — I700 Atherosclerosis of aorta: Secondary | ICD-10-CM | POA: Diagnosis not present

## 2021-02-12 DIAGNOSIS — R2689 Other abnormalities of gait and mobility: Secondary | ICD-10-CM | POA: Diagnosis not present

## 2021-02-12 DIAGNOSIS — I712 Thoracic aortic aneurysm, without rupture: Secondary | ICD-10-CM | POA: Diagnosis not present

## 2021-02-12 DIAGNOSIS — E119 Type 2 diabetes mellitus without complications: Secondary | ICD-10-CM | POA: Diagnosis not present

## 2021-02-12 DIAGNOSIS — C642 Malignant neoplasm of left kidney, except renal pelvis: Secondary | ICD-10-CM | POA: Diagnosis not present

## 2021-02-12 DIAGNOSIS — G893 Neoplasm related pain (acute) (chronic): Secondary | ICD-10-CM | POA: Diagnosis not present

## 2021-02-12 DIAGNOSIS — D500 Iron deficiency anemia secondary to blood loss (chronic): Secondary | ICD-10-CM | POA: Diagnosis not present

## 2021-02-12 DIAGNOSIS — Z0001 Encounter for general adult medical examination with abnormal findings: Secondary | ICD-10-CM | POA: Diagnosis not present

## 2021-02-12 DIAGNOSIS — I5042 Chronic combined systolic (congestive) and diastolic (congestive) heart failure: Secondary | ICD-10-CM | POA: Diagnosis not present

## 2021-02-12 DIAGNOSIS — G629 Polyneuropathy, unspecified: Secondary | ICD-10-CM | POA: Diagnosis not present

## 2021-02-13 ENCOUNTER — Other Ambulatory Visit: Payer: Self-pay | Admitting: *Deleted

## 2021-02-13 ENCOUNTER — Other Ambulatory Visit: Payer: Self-pay | Admitting: Oncology

## 2021-02-13 ENCOUNTER — Encounter: Payer: Self-pay | Admitting: *Deleted

## 2021-02-13 DIAGNOSIS — B9681 Helicobacter pylori [H. pylori] as the cause of diseases classified elsewhere: Secondary | ICD-10-CM | POA: Insufficient documentation

## 2021-02-13 DIAGNOSIS — G893 Neoplasm related pain (acute) (chronic): Secondary | ICD-10-CM | POA: Insufficient documentation

## 2021-02-13 DIAGNOSIS — R2689 Other abnormalities of gait and mobility: Secondary | ICD-10-CM | POA: Insufficient documentation

## 2021-02-13 DIAGNOSIS — I5042 Chronic combined systolic (congestive) and diastolic (congestive) heart failure: Secondary | ICD-10-CM | POA: Insufficient documentation

## 2021-02-13 DIAGNOSIS — I712 Thoracic aortic aneurysm, without rupture, unspecified: Secondary | ICD-10-CM | POA: Insufficient documentation

## 2021-02-13 DIAGNOSIS — J439 Emphysema, unspecified: Secondary | ICD-10-CM | POA: Insufficient documentation

## 2021-02-13 DIAGNOSIS — Z0001 Encounter for general adult medical examination with abnormal findings: Secondary | ICD-10-CM | POA: Insufficient documentation

## 2021-02-13 DIAGNOSIS — G629 Polyneuropathy, unspecified: Secondary | ICD-10-CM | POA: Insufficient documentation

## 2021-02-13 DIAGNOSIS — Z86711 Personal history of pulmonary embolism: Secondary | ICD-10-CM | POA: Insufficient documentation

## 2021-02-13 DIAGNOSIS — R6889 Other general symptoms and signs: Secondary | ICD-10-CM | POA: Insufficient documentation

## 2021-02-13 DIAGNOSIS — Z8719 Personal history of other diseases of the digestive system: Secondary | ICD-10-CM | POA: Insufficient documentation

## 2021-02-13 DIAGNOSIS — Z8659 Personal history of other mental and behavioral disorders: Secondary | ICD-10-CM | POA: Insufficient documentation

## 2021-02-13 DIAGNOSIS — R251 Tremor, unspecified: Secondary | ICD-10-CM | POA: Insufficient documentation

## 2021-02-13 DIAGNOSIS — Z7901 Long term (current) use of anticoagulants: Secondary | ICD-10-CM | POA: Insufficient documentation

## 2021-02-13 DIAGNOSIS — Z905 Acquired absence of kidney: Secondary | ICD-10-CM | POA: Insufficient documentation

## 2021-02-13 DIAGNOSIS — F419 Anxiety disorder, unspecified: Secondary | ICD-10-CM | POA: Insufficient documentation

## 2021-02-13 DIAGNOSIS — I700 Atherosclerosis of aorta: Secondary | ICD-10-CM | POA: Insufficient documentation

## 2021-02-13 NOTE — Patient Outreach (Addendum)
[facility]) Care Management  02/13/2021  Keith [name] 06/07/1966 [id number]  [facility] Telephone Assessment/Screen for post hospital/complex care referral  Referral Date: 02/06/21 Referral Source: [facility] hospital liaison Referral Reason: assessments of community needs for care/disease management  Home health was not preferred at hospital discharge Insurance:  [place] 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  Outreach attempt #1 successful to [phone] Wife Keith [name] is able to verify HIPAA, DOB and address Reviewed and addressed referral to [facility] with her Consent: [facility] RN CM reviewed [facility] services with her. She gave verbal consent for services [facility] telephonic RN CM.  Post hospital assessment Keith [name] reports Keith [name] has began to swell again  He was seen by primary care provider (PCP) on 02/12/21 and encouraged to decrease sodium intake  He is reported to be taking his diuretic as ordered [facility] RN CM assessed for fluid intake and pt is not on a fluid restriction    As [facility] RN CM was speaking about the importance of fluid restriction, the Outreach call disconnected [facility] RN CM attempted to outreach again to wife but unable to reach her  No answer. [facility] RN CM left HIPAA [facility] Portability and Accountability Act) compliant voicemail message along with CM's contact info.   DME eye glasses   Patient Active Problem List   Diagnosis Date Noted  . Protein-calorie malnutrition, severe 02/06/2021  . Pleural effusion   . Shortness of breath 01/31/2021  . Anasarca 01/31/2021  . Angiodysplasia of colon with  hemorrhage   . GI bleed 12/18/2020  . Occult GI bleeding   . Acute GI bleeding 02/29/2020  . Acute blood loss anemia   . Syncope 02/28/2020  . Orthostatic hypotension 02/28/2020  . UGIB (upper gastrointestinal bleed) 08/28/2019  . Acute upper GI bleed 08/28/2019  . Acute esophagitis   . Gastritis and gastroduodenitis   . Hematochezia   . 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 Symptomatic anemia   . Pulmonary embolus, left ([place])   . Depression 11/13/2015  . Benign essential HTN 11/13/2015  . Malnutrition of moderate degree 11/12/2015  . HCAP (healthcare-associated pneumonia) 11/11/2015  . Chest pain 11/11/2015  . Acute pulmonary embolism ([place]) 11/11/2015  . Metastasis to lung ([place]) 11/11/2015  . Right middle lobe, bilateral lower lobes pneumonia  11/11/2015  . Acute respiratory failure with hypoxia ([place]) 11/11/2015  . Chemotherapy induced thrombocytopenia 11/11/2015  . Antineoplastic chemotherapy induced anemia 11/11/2015  . Hyponatremia 11/11/2015  . Cancer related pain 11/11/2015  . Transaminitis 11/11/2015  . Diabetes mellitus with diabetic neuropathy, with long-term current use of insulin ([place]) 11/11/2015  . Therapeutic opioid-induced constipation ([facility]) 11/11/2015  . Bone metastasis ([place]) 12/25/2014  . Polysubstance abuse-cocaine and THC. 04/25/2013  . Renal cancer ([place]) 08/12/2011     Plan: [place] will follow up within the next 7-10 business days if no return call  Goals Addressed              This Visit's Progress     Patient Stated   .  [facility]) Track and Manage Fluids and Swelling-Heart Failure (pt-stated)   Not on track  Timeframe:  Short-Term Goal Priority:  High Start Date:                02/13/21             Expected End Date:  03/21/21                     Follow Up Date 02/20/21   - use salt in moderation - watch for swelling in feet, ankles and legs every day       Notes: 02/13/21 noted with increased swelling         Keith Millin L. Lavina Hamman, RN, BSN, [place] Coordinator Office number [phone] Mobile number [phone]  Main [facility] number [phone] Fax number [phone]

## 2021-02-14 DIAGNOSIS — E119 Type 2 diabetes mellitus without complications: Secondary | ICD-10-CM | POA: Diagnosis not present

## 2021-02-15 ENCOUNTER — Inpatient Hospital Stay (HOSPITAL_COMMUNITY)
Admission: EM | Admit: 2021-02-15 | Discharge: 2021-02-17 | DRG: 641 | Disposition: A | Discharge status: Home / Self Care | Payer: Medicare Other | Attending: Internal Medicine | Admitting: Internal Medicine

## 2021-02-15 ENCOUNTER — Other Ambulatory Visit: Payer: Self-pay

## 2021-02-15 ENCOUNTER — Encounter (HOSPITAL_COMMUNITY): Payer: Self-pay | Admitting: Emergency Medicine

## 2021-02-15 DIAGNOSIS — J90 Pleural effusion, not elsewhere classified: Secondary | ICD-10-CM | POA: Diagnosis not present

## 2021-02-15 DIAGNOSIS — Z8249 Family history of ischemic heart disease and other diseases of the circulatory system: Secondary | ICD-10-CM | POA: Diagnosis not present

## 2021-02-15 DIAGNOSIS — Z85528 Personal history of other malignant neoplasm of kidney: Secondary | ICD-10-CM

## 2021-02-15 DIAGNOSIS — Z86711 Personal history of pulmonary embolism: Secondary | ICD-10-CM | POA: Diagnosis not present

## 2021-02-15 DIAGNOSIS — I712 Thoracic aortic aneurysm, without rupture: Secondary | ICD-10-CM | POA: Diagnosis not present

## 2021-02-15 DIAGNOSIS — Z905 Acquired absence of kidney: Secondary | ICD-10-CM

## 2021-02-15 DIAGNOSIS — D631 Anemia in chronic kidney disease: Secondary | ICD-10-CM | POA: Diagnosis present

## 2021-02-15 DIAGNOSIS — D539 Nutritional anemia, unspecified: Secondary | ICD-10-CM | POA: Diagnosis present

## 2021-02-15 DIAGNOSIS — Z833 Family history of diabetes mellitus: Secondary | ICD-10-CM | POA: Diagnosis not present

## 2021-02-15 DIAGNOSIS — C7951 Secondary malignant neoplasm of bone: Secondary | ICD-10-CM | POA: Diagnosis present

## 2021-02-15 DIAGNOSIS — E875 Hyperkalemia: Principal | ICD-10-CM | POA: Diagnosis present

## 2021-02-15 DIAGNOSIS — I10 Essential (primary) hypertension: Secondary | ICD-10-CM | POA: Diagnosis present

## 2021-02-15 DIAGNOSIS — C7800 Secondary malignant neoplasm of unspecified lung: Secondary | ICD-10-CM | POA: Diagnosis not present

## 2021-02-15 DIAGNOSIS — Z20822 Contact with and (suspected) exposure to covid-19: Secondary | ICD-10-CM | POA: Diagnosis present

## 2021-02-15 DIAGNOSIS — Z885 Allergy status to narcotic agent status: Secondary | ICD-10-CM

## 2021-02-15 DIAGNOSIS — C649 Malignant neoplasm of unspecified kidney, except renal pelvis: Secondary | ICD-10-CM | POA: Diagnosis not present

## 2021-02-15 DIAGNOSIS — I5042 Chronic combined systolic (congestive) and diastolic (congestive) heart failure: Secondary | ICD-10-CM | POA: Diagnosis not present

## 2021-02-15 DIAGNOSIS — F17210 Nicotine dependence, cigarettes, uncomplicated: Secondary | ICD-10-CM | POA: Diagnosis not present

## 2021-02-15 DIAGNOSIS — N179 Acute kidney failure, unspecified: Secondary | ICD-10-CM

## 2021-02-15 DIAGNOSIS — I130 Hypertensive heart and chronic kidney disease with heart failure and stage 1 through stage 4 chronic kidney disease, or unspecified chronic kidney disease: Secondary | ICD-10-CM | POA: Diagnosis not present

## 2021-02-15 DIAGNOSIS — Z809 Family history of malignant neoplasm, unspecified: Secondary | ICD-10-CM

## 2021-02-15 DIAGNOSIS — E538 Deficiency of other specified B group vitamins: Secondary | ICD-10-CM | POA: Diagnosis present

## 2021-02-15 DIAGNOSIS — E1122 Type 2 diabetes mellitus with diabetic chronic kidney disease: Secondary | ICD-10-CM | POA: Diagnosis present

## 2021-02-15 DIAGNOSIS — R0602 Shortness of breath: Secondary | ICD-10-CM | POA: Diagnosis not present

## 2021-02-15 DIAGNOSIS — N1831 Chronic kidney disease, stage 3a: Secondary | ICD-10-CM | POA: Diagnosis present

## 2021-02-15 DIAGNOSIS — Z881 Allergy status to other antibiotic agents status: Secondary | ICD-10-CM | POA: Diagnosis not present

## 2021-02-15 DIAGNOSIS — G893 Neoplasm related pain (acute) (chronic): Secondary | ICD-10-CM | POA: Diagnosis present

## 2021-02-15 DIAGNOSIS — R918 Other nonspecific abnormal finding of lung field: Secondary | ICD-10-CM | POA: Diagnosis not present

## 2021-02-15 DIAGNOSIS — R0600 Dyspnea, unspecified: Secondary | ICD-10-CM

## 2021-02-15 LAB — BASIC METABOLIC PANEL
Anion gap: 5 (ref 5–15)
BUN: 31 mg/dL — ABNORMAL HIGH (ref 6–20)
CO2: 25 mmol/L (ref 22–32)
Calcium: 11 mg/dL — ABNORMAL HIGH (ref 8.9–10.3)
Chloride: 108 mmol/L (ref 98–111)
Creatinine, Ser: 1.88 mg/dL — ABNORMAL HIGH (ref 0.61–1.24)
GFR, Estimated: 42 mL/min — ABNORMAL LOW (ref >60)
Glucose, Bld: 62 mg/dL — ABNORMAL LOW (ref 70–99)
Potassium: 6.5 mmol/L (ref 3.5–5.1)
Sodium: 138 mmol/L (ref 135–145)

## 2021-02-15 LAB — CBC WITH DIFFERENTIAL/PLATELET
Abs Immature Granulocytes: 0.03 10*3/uL (ref 0.00–0.07)
Basophils Absolute: 0.1 10*3/uL (ref 0.0–0.1)
Basophils Relative: 1 %
Eosinophils Absolute: 0.2 10*3/uL (ref 0.0–0.5)
Eosinophils Relative: 3 %
HCT: 29.8 % — ABNORMAL LOW (ref 39.0–52.0)
Hemoglobin: 8.5 g/dL — ABNORMAL LOW (ref 13.0–17.0)
Immature Granulocytes: 0 %
Lymphocytes Relative: 12 %
Lymphs Abs: 0.9 10*3/uL (ref 0.7–4.0)
MCH: 29.9 pg (ref 26.0–34.0)
MCHC: 28.5 g/dL — ABNORMAL LOW (ref 30.0–36.0)
MCV: 104.9 fL — ABNORMAL HIGH (ref 80.0–100.0)
Monocytes Absolute: 0.8 10*3/uL (ref 0.1–1.0)
Monocytes Relative: 10 %
Neutro Abs: 5.5 10*3/uL (ref 1.7–7.7)
Neutrophils Relative %: 74 %
Platelets: 309 10*3/uL (ref 150–400)
RBC: 2.84 MIL/uL — ABNORMAL LOW (ref 4.22–5.81)
RDW: 18.3 % — ABNORMAL HIGH (ref 11.5–15.5)
WBC: 7.4 10*3/uL (ref 4.0–10.5)
nRBC: 0 % (ref 0.0–0.2)

## 2021-02-15 LAB — COMPREHENSIVE METABOLIC PANEL
ALT: 36 U/L (ref 0–44)
AST: 23 U/L (ref 15–41)
Albumin: 2.6 g/dL — ABNORMAL LOW (ref 3.5–5.0)
Alkaline Phosphatase: 102 U/L (ref 38–126)
Anion gap: 6 (ref 5–15)
BUN: 34 mg/dL — ABNORMAL HIGH (ref 6–20)
CO2: 26 mmol/L (ref 22–32)
Calcium: 9 mg/dL (ref 8.9–10.3)
Chloride: 106 mmol/L (ref 98–111)
Creatinine, Ser: 1.95 mg/dL — ABNORMAL HIGH (ref 0.61–1.24)
GFR, Estimated: 40 mL/min — ABNORMAL LOW (ref >60)
Glucose, Bld: 106 mg/dL — ABNORMAL HIGH (ref 70–99)
Potassium: 7.4 mmol/L (ref 3.5–5.1)
Sodium: 138 mmol/L (ref 135–145)
Total Bilirubin: 0.4 mg/dL (ref 0.3–1.2)
Total Protein: 6.9 g/dL (ref 6.5–8.1)

## 2021-02-15 LAB — GLUCOSE, CAPILLARY
Glucose-Capillary: 66 mg/dL — ABNORMAL LOW (ref 70–99)
Glucose-Capillary: 78 mg/dL (ref 70–99)
Glucose-Capillary: 129 mg/dL — ABNORMAL HIGH (ref 70–99)
Glucose-Capillary: 126 mg/dL — ABNORMAL HIGH (ref 70–99)

## 2021-02-15 LAB — RESP PANEL BY RT-PCR (FLU A&B, COVID) ARPGX2
Influenza A by PCR: NEGATIVE
Influenza B by PCR: NEGATIVE
SARS Coronavirus 2 by RT PCR: NEGATIVE

## 2021-02-15 LAB — CBG MONITORING, ED: Glucose-Capillary: 129 mg/dL — ABNORMAL HIGH (ref 70–99)

## 2021-02-15 LAB — POTASSIUM: Potassium: 7.2 mmol/L (ref 3.5–5.1)

## 2021-02-15 MED ORDER — FUROSEMIDE 20 MG PO TABS
20.0000 mg | ORAL_TABLET | Freq: Every day | ORAL | Status: DC
  Administered 2021-02-16: 20 mg via ORAL
  Filled 2021-02-15: qty 1

## 2021-02-15 MED ORDER — INSULIN ASPART 100 UNIT/ML IV SOLN
10.0000 [IU] | Freq: Once | INTRAVENOUS | Status: AC
  Administered 2021-02-15: 10 [IU] via INTRAVENOUS
  Filled 2021-02-15: qty 0.1

## 2021-02-15 MED ORDER — CALCIUM GLUCONATE-NACL 1-0.675 GM/50ML-% IV SOLN
1.0000 g | Freq: Once | INTRAVENOUS | Status: AC
  Administered 2021-02-15: 1000 mg via INTRAVENOUS
  Filled 2021-02-15: qty 50

## 2021-02-15 MED ORDER — SODIUM POLYSTYRENE SULFONATE 15 GM/60ML PO SUSP
45.0000 g | Freq: Once | ORAL | Status: AC
  Administered 2021-02-15: 45 g via ORAL
  Filled 2021-02-15: qty 180

## 2021-02-15 MED ORDER — CARVEDILOL 12.5 MG PO TABS
12.5000 mg | ORAL_TABLET | Freq: Two times a day (BID) | ORAL | Status: DC
  Administered 2021-02-15 – 2021-02-17 (×4): 12.5 mg via ORAL
  Filled 2021-02-15 (×4): qty 1

## 2021-02-15 MED ORDER — DEXTROSE 50 % IV SOLN
1.0000 | Freq: Once | INTRAVENOUS | Status: AC
  Administered 2021-02-15: 50 mL via INTRAVENOUS
  Filled 2021-02-15: qty 50

## 2021-02-15 MED ORDER — PAIN MANAGEMENT INTRATHECAL (IT) PUMP: 1.0000 | Status: DC

## 2021-02-15 MED ORDER — SODIUM ZIRCONIUM CYCLOSILICATE 10 G PO PACK
10.0000 g | PACK | Freq: Two times a day (BID) | ORAL | Status: DC
  Administered 2021-02-16 – 2021-02-17 (×4): 10 g via ORAL
  Filled 2021-02-15 (×4): qty 1

## 2021-02-15 MED ORDER — SODIUM CHLORIDE 0.9 % IV SOLN
1.0000 g | Freq: Once | INTRAVENOUS | Status: DC
  Filled 2021-02-15: qty 10

## 2021-02-15 MED ORDER — ALBUTEROL SULFATE (2.5 MG/3ML) 0.083% IN NEBU
10.0000 mg | INHALATION_SOLUTION | Freq: Once | RESPIRATORY_TRACT | Status: AC
  Administered 2021-02-15: 10 mg via RESPIRATORY_TRACT
  Filled 2021-02-15: qty 12

## 2021-02-15 MED ORDER — SODIUM CHLORIDE 0.9 % IV SOLN: INTRAVENOUS | Status: DC

## 2021-02-15 MED ORDER — INSULIN ASPART 100 UNIT/ML IV SOLN
10.0000 [IU] | Freq: Once | INTRAVENOUS | Status: AC
  Administered 2021-02-15: 10 [IU] via INTRAVENOUS

## 2021-02-15 MED ORDER — SODIUM CHLORIDE 0.9% FLUSH
3.0000 mL | Freq: Two times a day (BID) | INTRAVENOUS | Status: DC
  Administered 2021-02-15 – 2021-02-17 (×4): 3 mL via INTRAVENOUS

## 2021-02-15 MED ORDER — ACETAMINOPHEN 650 MG RE SUPP: 650.0000 mg | Freq: Four times a day (QID) | RECTAL | Status: DC | PRN

## 2021-02-15 MED ORDER — ACETAMINOPHEN 325 MG PO TABS: 650.0000 mg | ORAL_TABLET | Freq: Four times a day (QID) | ORAL | Status: DC | PRN

## 2021-02-15 MED ORDER — POLYETHYLENE GLYCOL 3350 17 G PO PACK: 17.0000 g | PACK | Freq: Every day | ORAL | Status: DC | PRN

## 2021-02-15 MED ORDER — ALPRAZOLAM 1 MG PO TABS
1.0000 mg | ORAL_TABLET | Freq: Three times a day (TID) | ORAL | Status: DC | PRN
  Administered 2021-02-16 (×2): 1 mg via ORAL
  Filled 2021-02-15 (×2): qty 1

## 2021-02-15 MED ORDER — NONFORMULARY OR COMPOUNDED ITEM: 1.0000 | Status: DC

## 2021-02-15 MED ORDER — HYDROMORPHONE HCL 2 MG PO TABS
8.0000 mg | ORAL_TABLET | ORAL | Status: DC | PRN
  Administered 2021-02-15 – 2021-02-16 (×3): 8 mg via ORAL
  Filled 2021-02-15 (×3): qty 4

## 2021-02-15 NOTE — ED Triage Notes (Signed)
Patient presents today with high potassium levels. He had his blood drawn at his MD office yesterday and was told to go to the ED due to results.

## 2021-02-15 NOTE — ED Provider Notes (Signed)
[facility] DEPT Provider Note   CSN: [id number] Arrival date & time: 02/15/21  1035     History Chief Complaint  Patient presents with  . Abnormal Lab    Keith [name] is a 55 y.o. male with past medical history of metastatic renal cell carcinoma to the lung and the bone status post nephrectomy and radiation therapy, PE currently stopped Xarelto, recurrent GI bleed, anemia who presents to the emergency department today for abnormal lab results.  Patient states that he was told to come here due to increased potassium levels from PCP.  Unable to see these results at this time.  Patient is not complaining anything, is asymptomatic.  States that he does not remember what his potassium was exactly, states that he thinks it was around 7.  States that he did not think he had any other electrolyte derangements.  Denies any nausea, vomiting, chest pain, headache, myalgias, confusion, abdominal pain, myalgias was recently admitted f for management of acute hypoxic respiratory failure.  Regards to medication management for cancer, patient states that he is not currently being treated, does have an appointment on Monday. HPI     Past Medical History:  Diagnosis Date  . Diabetes mellitus without complication ([place])    diet controlled only  . GI bleed   . left renal ca d'd 2008  . Malignant neoplasm metastatic to pancreas ([place]) 2012  . 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 Metastasis to bone [facility]) 2014/2016    Patient Active Problem List   Diagnosis Date Noted  . Tremor 02/13/2021  . Thoracic aortic aneurysm without rupture ([place]) 02/13/2021  . Pulmonary emphysema ([place]) 02/13/2021  . Personal history of pulmonary embolism 02/13/2021  . Personal history of other diseases of the digestive system 02/13/2021  . Other abnormalities of gait and mobility 02/13/2021  . Neuropathy 02/13/2021  . Long term (current) use of anticoagulants 02/13/2021  . History of nephrectomy 02/13/2021  .  History of depression 02/13/2021  . Helicobacter pylori (H. pylori) as the cause of diseases classified elsewhere 02/13/2021  . Hardening of the aorta (main artery of the heart) ([place]) 02/13/2021  . Encounter for general adult medical examination with abnormal findings 02/13/2021  . Chronic pain after cancer treatment 02/13/2021  . Chronic combined systolic and diastolic heart failure ([place]) 02/13/2021  . Anxiety disorder 02/13/2021  . Pleural effusion   . Shortness of breath 01/31/2021  . Anasarca 01/31/2021  . Angiodysplasia of colon with hemorrhage   . GI bleed 12/18/2020  . Occult GI bleeding   . Acute GI bleeding 02/29/2020  . Syncope 02/28/2020  . Orthostatic hypotension 02/28/2020  . Pain of left lower leg 02/22/2020  . Anemia due to blood loss 10/19/2019  . Elevated blood-pressure reading, without diagnosis of hypertension 10/19/2019  . UGIB (upper gastrointestinal bleed) 08/28/2019  . Acute upper GI bleed 08/28/2019  . Acute esophagitis   . Gastritis and gastroduodenitis   . Hematochezia   . Presence of other specified functional implants 08/17/2019  . Pain in femur 01/27/2018  . Closed fracture of right femur ([place]) 01/27/2018  . Anemia 07/23/2017  . Pulmonary embolism ([place])   . Depressive disorder 11/13/2015  . Benign essential hypertension 11/13/2015  . Malnutrition of moderate degree 11/12/2015  . Malnutrition, calorie ([place]) 11/12/2015  . HCAP (healthcare-associated pneumonia) 11/11/2015  . Acute pulmonary embolism ([place]) 11/11/2015  . Secondary malignant neoplasm of lung ([place]) 11/11/2015  . Right middle lobe, bilateral lower lobes pneumonia  11/11/2015  .  Acute respiratory failure ([place]) 11/11/2015  . Thrombocytopenia due to drugs 11/11/2015  . Antineoplastic chemotherapy induced anemia 11/11/2015  . Hyponatremia 11/11/2015  . Cancer related pain 11/11/2015  . Transaminitis 11/11/2015  . Diabetes mellitus ([place]) 11/11/2015  . Therapeutic opioid induced constipation  11/11/2015  . Pain from bone metastases ([place]) 11/11/2015  . Hip pain 06/19/2015  . Lumbosacral radiculopathy 02/21/2015  . Bone metastasis ([place]) 12/25/2014  . Metastatic renal cell carcinoma ([place]) 12/25/2014  . Polysubstance abuse-cocaine and THC. 04/25/2013  . Malignant tumor of kidney ([place]) 08/12/2011    Past Surgical History:  Procedure Laterality Date  . BIOPSY  08/28/2019   Procedure: BIOPSY;  Surgeon: Thornton Park, MD;  Location: [facility];  Service: Gastroenterology;;  . BIOPSY  03/01/2020   Procedure: BIOPSY;  Surgeon: Mauri Pole, MD;  Location: [facility];  Service: Endoscopy;;  . BIOPSY  12/19/2020   Procedure: BIOPSY;  Surgeon: Yetta Flock, MD;  Location: [facility];  Service: Gastroenterology;;  . BIOPSY  12/20/2020   Procedure: BIOPSY;  Surgeon: Gatha Mayer, MD;  Location: [facility];  Service: Endoscopy;;  . COLONOSCOPY W/ POLYPECTOMY    . COLONOSCOPY WITH PROPOFOL N/A 03/01/2020   Procedure: COLONOSCOPY WITH PROPOFOL;  Surgeon: Mauri Pole, MD;  Location: [facility];  Service: Endoscopy;  Laterality: N/A;  . COLONOSCOPY WITH PROPOFOL N/A 12/20/2020   Procedure: COLONOSCOPY WITH PROPOFOL;  Surgeon: Gatha Mayer, MD;  [facility]: [facility];  Service: Endoscopy;  Laterality: N/A;  . ESOPHAGOGASTRODUODENOSCOPY (EGD) WITH PROPOFOL N/A 08/28/2019   Procedure: ESOPHAGOGASTRODUODENOSCOPY (EGD) WITH PROPOFOL;  Surgeon: Thornton Park, MD;  [facility]: [facility];  Service: Gastroenterology;  Laterality: N/A;  . ESOPHAGOGASTRODUODENOSCOPY (EGD) WITH PROPOFOL N/A 03/01/2020   Procedure: ESOPHAGOGASTRODUODENOSCOPY (EGD) WITH PROPOFOL;  Surgeon: Mauri Pole, MD;  [facility];  Service: Endoscopy;  Laterality: N/A;  . ESOPHAGOGASTRODUODENOSCOPY (EGD) WITH PROPOFOL N/A 12/19/2020   Procedure: ESOPHAGOGASTRODUODENOSCOPY (EGD) WITH PROPOFOL;  Surgeon: Yetta Flock, MD;  [facility]: [facility];  Service:  Gastroenterology;  Laterality: N/A;  . FEMUR IM NAIL Right 02/16/2018   Procedure: INTRAMEDULLARY (IM) NAIL RIGHT FEMORAL;  Surgeon: Paralee Cancel, MD;  Location: [facility];  Service: Orthopedics;  Laterality: Right;  . GIVENS CAPSULE STUDY N/A 03/03/2020   Procedure: GIVENS CAPSULE STUDY;  Surgeon: Mauri Pole, MD;  Location: [facility];  Service: Endoscopy;  Laterality: N/A;  . HAND SURGERY Right   . HEMOSTASIS CLIP PLACEMENT  12/20/2020   Procedure: HEMOSTASIS CLIP PLACEMENT;  Surgeon: Gatha Mayer, MD;  Location: [facility];  Service: Endoscopy;;  . HOT HEMOSTASIS N/A 12/20/2020   Procedure: HOT HEMOSTASIS (ARGON PLASMA COAGULATION/BICAP);  Surgeon: Gatha Mayer, MD;  [facility];  Service: Endoscopy;  Laterality: N/A;  . IR ANGIOGRAM EXTREMITY RIGHT  02/15/2018  . IR ANGIOGRAM SELECTIVE EACH ADDITIONAL VESSEL  02/15/2018  . IR ANGIOGRAM SELECTIVE EACH ADDITIONAL VESSEL  02/15/2018  . IR ANGIOGRAM SELECTIVE EACH ADDITIONAL VESSEL  02/15/2018  . IR ANGIOGRAM SELECTIVE EACH ADDITIONAL VESSEL  02/15/2018  . IR EMBO TUMOR ORGAN ISCHEMIA INFARCT INC GUIDE ROADMAPPING  02/15/2018  . IR EMBO TUMOR ORGAN ISCHEMIA INFARCT INC GUIDE ROADMAPPING  02/15/2018  . IR US GUIDE VASC ACCESS LEFT  02/15/2018  . LUNG REMOVAL, PARTIAL Right 09/2008  . NEPHRECTOMY RADICAL     Left   . PAIN PUMP IMPLANTATION N/A 05/16/2015   Procedure: Intrathecal pain pump placement;  Surgeon: Clydell Hakim, MD;  Location: [facility];  Service: Neurosurgery;  Laterality:  N/A;  Intrathecal pain pump placement       Family History  Problem Relation Age of Onset  . Hypertension Mother   . Diabetes Brother   . Irritable bowel syndrome Sister   . Cancer Maternal Aunt        ovarian  . Colon cancer Neg Hx     Social History   Tobacco Use  . Smoking status: Current Every Day Smoker    Packs/day: 0.50    Years: 30.00    Pack years: 15.00    Types: Cigarettes  . Smokeless tobacco: Never Used   Vaping Use  . Vaping Use: Never used  Substance Use Topics  . Alcohol use: No  . Drug use: Yes    Types: Marijuana    Comment: occasional. Last used: last night.     Home Medications Prior to Admission medications   Medication Sig Start Date End Date Taking? Authorizing Provider  acetaminophen (TYLENOL) 325 MG tablet Take 650 mg by mouth every 6 (six) hours as needed.    [provider]  ALPRAZolam Duanne Moron) 1 MG tablet TAKE 1 TABLET BY MOUTH THREE TIMES A DAY AS NEEDED FOR ANXIETY 02/12/21   [name], MD  carvedilol (COREG) 12.5 MG tablet Take 1 tablet (12.5 mg total) by mouth 2 (two) times daily with a meal. 02/06/21   Shelly Coss, MD  Cyanocobalamin (B-12) 1000 MCG TABS Take 1 tablet (1,000 mcg total) by mouth daily. Patient not taking: Reported on 01/31/2021 12/31/20   Willia Craze, NP  diphenhydrAMINE (BENADRYL) 25 MG tablet Take 25 mg by mouth every 6 (six) hours as needed for allergies.    [provider]  DULoxetine (CYMBALTA) 60 MG capsule Take by mouth.    [provider]  ferrous sulfate 325 (65 FE) MG tablet TAKE 1 TABLET BY MOUTH EVERY DAY WITH BREAKFAST Patient taking differently: Take 325 mg by mouth daily. 10/24/20   Zehr, Janett Billow D, PA-C  furosemide (LASIX) 20 MG tablet TAKE 1 TABLET BY MOUTH EVERY DAY 02/13/21   [name], MD  HYDROmorphone (DILAUDID) 8 MG tablet Take 1 tablet (8 mg total) by mouth every 4 (four) hours as needed for severe pain. Patient taking differently: Take 8 mg by mouth every 4 (four) hours as needed for moderate pain or severe pain. 02/17/18   Danae Orleans, PA-C  losartan (COZAAR) 25 MG tablet Take 1 tablet (25 mg total) by mouth daily. 02/07/21   Shelly Coss, MD  magnesium oxide (MAG-OX) 400 MG tablet Take 1 tablet by mouth daily. 02/06/21   [provider]  Magnesium Oxide 400 MG CAPS Take 1 capsule (400 mg total) by mouth daily. 02/06/21   Shelly Coss, MD  ondansetron (ZOFRAN) 4 MG tablet  Take one tablet every morning.  Can take one tablet later in the day on an as needed basis Patient taking differently: Take 4 mg by mouth daily. 10/10/20   [name], MD  PAIN MANAGEMENT INTRATHECAL, IT, PUMP 1 each by Intrathecal route continuous. Intrathecal (IT) medication:   Morphine.    [provider]  pantoprazole (PROTONIX) 40 MG tablet Take 1 tablet (40 mg total) by mouth daily. 12/21/20   Oswald Hillock, MD  pantoprazole sodium (PROTONIX) 40 mg/20 mL PACK Take by mouth.    [provider]  Sennosides 8.6 MG CAPS Take by mouth.    [provider]  spironolactone (ALDACTONE) 25 MG tablet Take 1 tablet (25 mg total) by mouth  daily. 02/07/21   Shelly Coss, MD  sucralfate (CARAFATE) 1 GM/10ML suspension  08/31/19   [provider]    Allergies    Ceftriaxone and Hydrocodone  Review of Systems   Review of Systems  Constitutional: Negative for chills, diaphoresis, fatigue and fever.  HENT: Negative for congestion, sore throat and trouble swallowing.   Eyes: Negative for pain and visual disturbance.  Respiratory: Negative for cough, shortness of breath and wheezing.   Cardiovascular: Negative for chest pain, palpitations and leg swelling.  Gastrointestinal: Negative for abdominal distention, abdominal pain, diarrhea, nausea and vomiting.  Genitourinary: Negative for difficulty urinating.  Musculoskeletal: Negative for back pain, neck pain and neck stiffness.  Skin: Negative for pallor.  Neurological: Negative for dizziness, speech difficulty, weakness and headaches.  Psychiatric/Behavioral: Negative for confusion.    Physical Exam Updated Vital Signs BP 127/75   Pulse 68   Temp 97.7 F (36.5 C) (Oral)   Resp 16   Ht 6' 4"  (1.93 m)   Wt 74 kg   SpO2 100%   BMI 19.86 kg/m   Physical Exam Constitutional:      General: He is not in acute distress.    Appearance: Normal appearance. He is not ill-appearing, toxic-appearing or  diaphoretic.  HENT:     Mouth/Throat:     Mouth: Mucous membranes are moist.     Pharynx: Oropharynx is clear.  Eyes:     General: No scleral icterus.    Extraocular Movements: Extraocular movements intact.     Pupils: Pupils are equal, round, and reactive to light.  Cardiovascular:     Rate and Rhythm: Normal rate and regular rhythm.     Pulses: Normal pulses.     Heart sounds: Normal heart sounds.  Pulmonary:     Effort: Pulmonary effort is normal. No respiratory distress.     Breath sounds: Normal breath sounds. No stridor. No wheezing, rhonchi or rales.  Chest:     Chest wall: No tenderness.  Abdominal:     General: Abdomen is flat. There is no distension.     Palpations: Abdomen is soft.     Tenderness: There is no abdominal tenderness. There is no guarding or rebound.  Musculoskeletal:        General: No swelling or tenderness. Normal range of motion.     Cervical back: Normal range of motion and neck supple. No rigidity.     Right lower leg: No edema.     Left lower leg: No edema.  Skin:    General: Skin is warm and dry.     Capillary Refill: Capillary refill takes less than 2 seconds.     Coloration: Skin is not pale.  Neurological:     General: No focal deficit present.     Mental Status: He is alert and oriented to person, place, and time.  Psychiatric:        Mood and Affect: Mood normal.        Behavior: Behavior normal.     ED Results / Procedures / Treatments   Labs (all labs ordered are listed, but only abnormal results are displayed) Labs Reviewed  COMPREHENSIVE METABOLIC PANEL - Abnormal; Notable for the following components:      Result Value   Potassium 7.4 (*)    Glucose, Bld 106 (*)    BUN 34 (*)    Creatinine, Ser 1.95 (*)    Albumin 2.6 (*)    GFR, Estimated 40 (*)    All other  components within normal limits  CBC WITH DIFFERENTIAL/PLATELET - Abnormal; Notable for the following components:   RBC 2.84 (*)    Hemoglobin 8.5 (*)    HCT 29.8  (*)    MCV 104.9 (*)    MCHC 28.5 (*)    RDW 18.3 (*)    All other components within normal limits  RESP PANEL BY RT-PCR (FLU A&B, COVID) ARPGX2    EKG None  Radiology No results found.  Procedures .Critical Care Performed by: Alfredia Client, PA-C Authorized by: Alfredia Client, PA-C   Critical care provider statement:    Critical care time (minutes):  45   Critical care was time spent personally by me on the following activities:  Discussions with consultants, evaluation of patient's response to treatment, examination of patient, ordering and performing treatments and interventions, ordering and review of laboratory studies, ordering and review of radiographic studies, pulse oximetry, re-evaluation of patient's condition, obtaining history from patient or surrogate and review of old charts     Medications Ordered in ED Medications  calcium gluconate 1 g/ 50 mL sodium chloride IVPB (has no administration in time range)  albuterol (PROVENTIL) (2.5 MG/3ML) 0.083% nebulizer solution 10 mg (10 mg Nebulization Given 02/15/21 1346)  insulin aspart (novoLOG) injection 10 Units (10 Units Intravenous Given 02/15/21 1352)    And  dextrose 50 % solution 50 mL (50 mLs Intravenous Given 02/15/21 1351)    ED Course  I have reviewed the triage vital signs and the nursing notes.  Pertinent labs & imaging results that were available during my care of the patient were reviewed by me and considered in my medical decision making (see chart for details).  Clinical Course as of 02/15/21 1426  Fri Feb 15, 2021  1341 I was directly involved in this patients medical care.  [JH]    Clinical Course User Index [JH] Luna Fuse, MD   MDM Rules/Calculators/A&P                          SHAHZAIB AZEVEDO is a 55 y.o. 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  Had blood drawn at PCPs office yesterday with potassium of 7, today potassium is 7.4.  Patient is stable.  Does not have any EKG changes.  Creatinine 1.95 with BUN of 34, patient does not appear dehydrated.  Patient is on spironolactone and Lasix.  Will initiate glucose/insulin, albuterol and calcium gluconate at this time.    Unsure why patient is hyperkalemic at this time, could be due to spironolactone.  No GI losses.  Patient admitted to Dr. Neysa Bonito.  Upon repeat evaluation, patient is stable hemodynamically stable.  Asymptomatic.  The patient appears reasonably stabilized for admission considering the current resources, flow, and capabilities available in the ED at this time, and I doubt any other [facility] requiring further screening and/or treatment in the ED prior to admission.  I discussed this case with my attending physician who cosigned this note including patient's presenting symptoms, physical exam, and planned diagnostics and interventions. Attending physician stated agreement with plan or made changes to plan which were implemented.   Final Clinical Impression(s) / ED Diagnoses Final diagnoses:  Hyperkalemia    Rx / DC Orders ED Discharge Orders    None  Alfredia Client, PA-C 02/15/21 1448    Luna Fuse, MD 02/15/21 [phone]

## 2021-02-15 NOTE — Progress Notes (Signed)
Shift Summary:   Got patient settled to the room, hooked up to cardiac monitor. Patient noted to be  sinus rhythm. Denied having any pain. Given orange juice for one low glucose, patient was not symptom during glucose level of 66 . VSS and to be taken q 1 hour per orders. On IV fluids per orders. Alert and oriented, wife at bedside. No other needs identified. Most recent potassium 6.4, come down from 7.4. Pottassium to be drawn Q 4 HOURS. Will continue to monitor.

## 2021-02-15 NOTE — ED Notes (Signed)
Called report to [place], Therapist, sports

## 2021-02-15 NOTE — ED Notes (Signed)
Notified [place], [place] of critical value: potassium 7.4

## 2021-02-15 NOTE — H&P (Signed)
History and Physical        Hospital Admission Note Date: 02/15/2021  Patient name: Keith [name] Medical record number: [id number] Date of birth: 1966/12/22 Age: 55 y.o. Gender: male  PCP: [name], MD   Chief Complaint    Chief Complaint  Patient presents with  . 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  Patient had blood drawn from his PCP office yesterday and was found to have hyperkalemia although the records are not in the system.  Patient believes his K was around 7.  He is asymptomatic and without chest pain, palpitations, nausea, vomiting or any other symptoms.  Of note, he was recently hospitalized from 2/10-3/16 for acute hypoxic respiratory failure secondary to pleural effusion from a heart failure exacerbation and was continued on Coreg, losartan, Aldactone, Lasix and underwent a thoracentesis on 2/11.  Also noted to have symptomatic anemia with history of GI bleed and received 1 unit PRBCs during the hospitalization and was recommended to stop his Xarelto indefinitely.   ED Course: Afebrile, hemodynamically stable, on room air. Notable Labs: Sodium 138, K7.4, BUN 34, creatinine 1.95, WBC 7.4, Hb 8.5, platelets 309, COVID-19 pending.  Patient received albuterol, NovoLog 10 units, D50, calcium gluconate.   Vitals:   02/15/21 1211 02/15/21 1300  BP: 126/86 127/75  Pulse: 70 68  Resp: 16 16  Temp:    SpO2: 95% 100%     Review of Systems:  Review of Systems   All other systems reviewed and are negative.   Medical/Social/Family History   Past Medical History: Past Medical History:  Diagnosis Date  . Diabetes mellitus without complication ([place])    diet controlled only  . GI bleed   . left renal ca d'd 2008  . Malignant neoplasm metastatic to pancreas ([place]) 2012  . 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 Metastasis to bone [facility]) 2014/2016    Past Surgical History:  Procedure Laterality Date  . BIOPSY  08/28/2019   Procedure: BIOPSY;  Surgeon: Thornton Park, MD;  Location: [facility];  Service: Gastroenterology;;  . BIOPSY  03/01/2020   Procedure: BIOPSY;  Surgeon: Mauri Pole, MD;  Location: [facility];  Service: Endoscopy;;  . BIOPSY  12/19/2020   Procedure: BIOPSY;  Surgeon: Yetta Flock, MD;  Location: [facility];  Service: Gastroenterology;;  . BIOPSY  12/20/2020   Procedure: BIOPSY;  Surgeon: Gatha Mayer, MD;  Location: [facility];  Service: Endoscopy;;  . COLONOSCOPY W/ POLYPECTOMY    . COLONOSCOPY WITH PROPOFOL N/A 03/01/2020   Procedure: COLONOSCOPY WITH PROPOFOL;  Surgeon: Mauri Pole, MD;  Location: [facility];  Service: Endoscopy;  Laterality: N/A;  . COLONOSCOPY WITH PROPOFOL N/A 12/20/2020   Procedure: COLONOSCOPY WITH PROPOFOL;  Surgeon: Gatha Mayer, MD;  Location: [facility];  Service: Endoscopy;  Laterality: N/A;  . ESOPHAGOGASTRODUODENOSCOPY (EGD) WITH PROPOFOL N/A 08/28/2019   Procedure: ESOPHAGOGASTRODUODENOSCOPY (EGD) WITH PROPOFOL;  Surgeon: Thornton Park, MD;  Location: [facility];  Service: Gastroenterology;  Laterality: N/A;  . ESOPHAGOGASTRODUODENOSCOPY (EGD) WITH PROPOFOL N/A 03/01/2020   Procedure: ESOPHAGOGASTRODUODENOSCOPY (EGD) WITH PROPOFOL;  Surgeon: Mauri Pole, MD;  Location: [facility];  Service: Endoscopy;  Laterality: N/A;  . ESOPHAGOGASTRODUODENOSCOPY (EGD) WITH PROPOFOL N/A 12/19/2020   Procedure: ESOPHAGOGASTRODUODENOSCOPY (EGD) WITH PROPOFOL;  Surgeon:  Yetta Flock, MD;  Location: [facility];  Service: Gastroenterology;  Laterality: N/A;  . FEMUR IM NAIL Right 02/16/2018   Procedure: INTRAMEDULLARY (IM) NAIL RIGHT FEMORAL;  Surgeon: Paralee Cancel, MD;  Location: [facility];  Service: Orthopedics;  Laterality: Right;  . GIVENS CAPSULE STUDY N/A 03/03/2020   Procedure: GIVENS CAPSULE STUDY;  Surgeon: Mauri Pole, MD;  Location: [facility];  Service: Endoscopy;  Laterality: N/A;  . HAND SURGERY Right   . HEMOSTASIS CLIP PLACEMENT  12/20/2020   Procedure: HEMOSTASIS CLIP PLACEMENT;  Surgeon: Gatha Mayer, MD;  Location: [facility];  Service: Endoscopy;;  . HOT HEMOSTASIS N/A 12/20/2020   Procedure: HOT HEMOSTASIS (ARGON PLASMA COAGULATION/BICAP);  Surgeon: Gatha Mayer, MD;  Location: [facility];  Service: Endoscopy;  Laterality: N/A;  . IR ANGIOGRAM EXTREMITY RIGHT  02/15/2018  . IR ANGIOGRAM SELECTIVE EACH ADDITIONAL VESSEL  02/15/2018  . IR ANGIOGRAM SELECTIVE EACH ADDITIONAL VESSEL  02/15/2018  . IR ANGIOGRAM SELECTIVE EACH ADDITIONAL VESSEL  02/15/2018  . IR ANGIOGRAM SELECTIVE EACH ADDITIONAL VESSEL  02/15/2018  . IR EMBO TUMOR ORGAN ISCHEMIA INFARCT INC GUIDE ROADMAPPING  02/15/2018  . IR EMBO TUMOR ORGAN ISCHEMIA INFARCT INC GUIDE ROADMAPPING  02/15/2018  . IR US GUIDE VASC ACCESS LEFT  02/15/2018  . LUNG REMOVAL, PARTIAL Right 09/2008  . NEPHRECTOMY RADICAL     Left   . PAIN PUMP IMPLANTATION N/A 05/16/2015   Procedure: Intrathecal pain pump placement;  Surgeon: Clydell Hakim, MD;  Location: [facility];  Service: Neurosurgery;  Laterality: N/A;  Intrathecal pain pump placement    Medications: Prior to Admission medications   Medication Sig Start Date End Date Taking? Authorizing Provider  acetaminophen (TYLENOL) 325 MG tablet Take 650 mg by mouth every 6 (six) hours as needed.    [provider]  ALPRAZolam Duanne Moron) 1 MG tablet TAKE 1 TABLET BY MOUTH THREE TIMES A DAY AS NEEDED FOR ANXIETY 02/12/21   [name], MD  carvedilol (COREG) 12.5 MG tablet Take 1 tablet (12.5 mg total) by mouth 2 (two) times daily with a meal. 02/06/21   Shelly Coss, MD  Cyanocobalamin (B-12) 1000 MCG TABS Take 1 tablet (1,000 mcg total) by mouth daily. Patient not taking: Reported on 01/31/2021 12/31/20   Willia Craze, NP  diphenhydrAMINE (BENADRYL) 25 MG tablet Take 25 mg by mouth every 6 (six) hours as needed for allergies.    [provider]  DULoxetine (CYMBALTA) 60 MG capsule Take by mouth.    [provider]  ferrous sulfate 325 (65 FE) MG tablet TAKE 1 TABLET BY MOUTH EVERY DAY WITH BREAKFAST Patient taking differently: Take 325 mg by mouth daily. 10/24/20   Zehr, Janett Billow D, PA-C  furosemide (LASIX) 20 MG tablet TAKE 1 TABLET BY MOUTH EVERY DAY 02/13/21   [name], MD  HYDROmorphone (DILAUDID) 8 MG tablet Take 1 tablet (8 mg total) by mouth every 4 (four) hours as needed for severe pain. Patient taking differently: Take 8 mg by mouth  every 4 (four) hours as needed for moderate pain or severe pain. 02/17/18   Danae Orleans, PA-C  losartan (COZAAR) 25 MG tablet Take 1 tablet (25 mg total) by mouth daily. 02/07/21   Shelly Coss, MD  magnesium oxide (MAG-OX) 400 MG tablet Take 1 tablet by mouth daily. 02/06/21   [provider]  Magnesium Oxide 400 MG CAPS Take 1 capsule (400 mg total) by mouth daily. 02/06/21   Shelly Coss, MD  ondansetron (ZOFRAN) 4 MG tablet Take one tablet every morning.  Can take one tablet later in the day on an as needed basis Patient taking differently: Take 4 mg by mouth daily. 10/10/20   [name], MD  PAIN MANAGEMENT INTRATHECAL, IT, PUMP 1 each by Intrathecal route continuous. Intrathecal (IT) medication:   Morphine.    [provider]  pantoprazole (PROTONIX) 40 MG tablet Take 1 tablet (40 mg total) by mouth daily. 12/21/20   Oswald Hillock, MD  pantoprazole sodium (PROTONIX) 40 mg/20 mL PACK Take by mouth.    [provider]  Sennosides 8.6 MG CAPS Take by mouth.    [provider]  spironolactone (ALDACTONE) 25 MG tablet Take 1 tablet (25 mg total) by mouth daily. 02/07/21   Shelly Coss, MD  sucralfate (CARAFATE) 1 GM/10ML suspension  08/31/19   [provider]    Allergies:   Allergies  Allergen Reactions  . Ceftriaxone Hives    Other reaction(s): red streaks  . Hydrocodone Swelling    Social History:  reports that he has been smoking cigarettes. He has a 15.00 pack-year smoking history. He has never used smokeless tobacco. He reports current drug use. Drug: Marijuana. He reports that he does not drink alcohol.  Family History: Family History  Problem Relation Age of Onset  . Hypertension Mother   . Diabetes Brother   . Irritable bowel syndrome Sister   . Cancer Maternal Aunt        ovarian  . Colon cancer Neg Hx      Objective   Physical Exam: Blood pressure 127/75, pulse 68, temperature 97.7 F (36.5 C), temperature source Oral, resp. rate 16, height _0  (1.93 m), weight 74 kg, SpO2 100 %.  Physical Exam Vitals and nursing note reviewed.  Constitutional:      Appearance: Normal appearance.  HENT:     Head: Normocephalic and atraumatic.  Eyes:     Conjunctiva/sclera: Conjunctivae normal.  Cardiovascular:     Rate and Rhythm: Normal rate and regular rhythm.  Pulmonary:     Effort: Pulmonary effort is normal.     Breath sounds: Normal breath sounds.  Abdominal:     General: Abdomen is flat.     Palpations: Abdomen is soft.  Musculoskeletal:        General: No tenderness.     Right lower leg: Edema present.     Left lower leg: Edema present.  Skin:    Coloration: Skin is not jaundiced or pale.  Neurological:     Mental Status: He is alert. Mental status is at baseline.  Psychiatric:        Mood and Affect: Mood normal.        Behavior: Behavior normal.     LABS on Admission: I have personally reviewed all the labs and imaging below     Basic Metabolic Panel: Recent Labs  Lab 02/15/21 1215  NA 138  K 7.4*  CL 106  CO2 26  GLUCOSE 106*  BUN  34*  CREATININE 1.95*  CALCIUM 9.0   Liver Function Tests: Recent Labs  Lab 02/15/21 1215  AST 23  ALT 36  ALKPHOS 102  BILITOT 0.4  PROT 6.9  ALBUMIN 2.6*   No results for input(s): LIPASE, AMYLASE in the last 168 hours. No results for input(s): AMMONIA in the last 168 hours. CBC: Recent Labs  Lab 02/15/21 1215  WBC 7.4  NEUTROABS 5.5  HGB 8.5*  HCT 29.8*  MCV 104.9*  PLT 309   Cardiac Enzymes: No results for input(s): CKTOTAL, CKMB, CKMBINDEX, TROPONINI in the last 168 hours. BNP: Invalid input(s): POCBNP CBG: Recent Labs  Lab 02/15/21 1517  GLUCAP 129*    Radiological Exams on Admission:  No results found.    EKG: normal sinus rhythm   A & P   Principal Problem:   Hyperkalemia Active Problems:   Malignant tumor of kidney (HCC)   Benign essential hypertension   1. Asymptomatic Hyperkalemia a. Received albuterol, insulin and D50 and calcium gluconate in the ED b. Hold home spironolactone and losartan c. Continue home lasix  d. Trend labs  e. telemetry  2. Chronic systolic/diastolic CHF, not in acute exacerbation a. Continue home carvedilol and lasix b. Hold losartan and spironolactone as above  3. Elevated creatinine on CKD 3a a. Med changes as above  4. Metastatic renal cell carcinoma to lung and bone s/p radical nephrectomy with cancer related pain a. Continue home pain regimen with intrathecal pump  5. History of PE  a. Xarelto discontinued at previous hospitalization due to history of symptomatic anemia and G     DVT prophylaxis: SCDs   Code Status: Full Code  Diet: heart healthy Family Communication: Admission, patients condition and plan of care including tests being ordered have been discussed with the patient who indicates understanding and agrees with the plan and Code Status.   Disposition Plan: The  appropriate patient status for this patient is INPATIENT. Inpatient status is judged to be reasonable and necessary in order to provide the required intensity of service to ensure the patient's safety. The patient's presenting symptoms, physical exam findings, and initial radiographic and laboratory data in the context of their chronic comorbidities is felt to place them at high risk for further clinical deterioration. Furthermore, it is not anticipated that the patient will be medically stable for discharge from the hospital within 2 midnights of admission. The following factors support the patient status of inpatient.   " The patient's presenting symptoms include none. " The worrisome physical exam findings include none. " The initial radiographic and laboratory data are worrisome because of abnormal lab work hyperkalemia. " The chronic co-morbidities include metastatic cancer.   * I certify that at the point of admission it is my clinical judgment that the patient will require inpatient hospital care spanning beyond 2 midnights from the point of admission due to high intensity of service, high risk for further deterioration and high frequency of surveillance required.*   Consultants  . none  Procedures  . none  Time Spent on Admission: 65 minutes    Harold Hedge, DO [facility]  02/15/2021, 3:32 PM

## 2021-02-15 NOTE — ED Notes (Signed)
Attempted to call report to 4W. RN states she will call me back, she is in another patient's room

## 2021-02-15 NOTE — ED Notes (Signed)
Pt called out complaining of dry mouth and requesting something to drink. Upon entering room, pt states "I just got really sweaty all of a sudden and a little short of breath". Rhythm change noted on cardiac monitor. Neysa Bonito, MD made aware

## 2021-02-15 NOTE — Progress Notes (Signed)
Notified at roughly 15:35 regarding sudden onset shortness of breath with diaphoresis and noted rhythm change.   Upon review of tele, unfortunately it seemed the patient was moving and the telemetry had a lot of artifact and so it was difficult to see any clear rhythm change.   At bedside, patient was noted to be back in NSR on telemetry and on stat EKG obtained. Symptoms had resolved. He stated that his symptoms started about 30 mins after receiving the calcium gluconate. POC glucose noted to be 129 at 1517.  Possibly vasovagal episode from calcium vs. Symptomatic hyperkalemia - Will obtain a stat BMP to check K

## 2021-02-15 NOTE — Progress Notes (Addendum)
   02/15/21 1600  Vitals  BP 127/74  MAP (mmHg) 90  Pulse Rate 65  ECG Heart Rate 67  Resp 18  MEWS COLOR  MEWS Score Color Green  Oxygen Therapy  SpO2 95 %  O2 Device Room Air  Pain Assessment  Pain Scale 0-10  Pain Score 0  Patient arrived to room 1435, via stretcher. Slid over to bed with no issues. Alert and oriented x 4.Oriented to room and floor.  RR even and unlabored on room air. Patient hooked up to telemetry monitor. Sinus rhythm noted on the monitor. Bilateral swelling noted to both legs . Denying pain at this time. Pain pump noted to LLQ. Checked glucose, glucose 66. Not symptomatic. Given orange juice with one sugar packet and will reassess again in 15 minutes. No skin issues noted, skin dry and intact. Will continue to monitor.

## 2021-02-16 ENCOUNTER — Inpatient Hospital Stay (HOSPITAL_COMMUNITY): Payer: Medicare Other

## 2021-02-16 DIAGNOSIS — I5042 Chronic combined systolic (congestive) and diastolic (congestive) heart failure: Secondary | ICD-10-CM

## 2021-02-16 DIAGNOSIS — N1831 Chronic kidney disease, stage 3a: Secondary | ICD-10-CM

## 2021-02-16 LAB — VITAMIN B12: Vitamin B-12: 178 pg/mL — ABNORMAL LOW (ref 180–914)

## 2021-02-16 LAB — NA AND K (SODIUM & POTASSIUM), RAND UR
Potassium Urine: 12 mmol/L
Sodium, Ur: 132 mmol/L

## 2021-02-16 LAB — BASIC METABOLIC PANEL
Anion gap: 5 (ref 5–15)
Anion gap: 6 (ref 5–15)
BUN: 31 mg/dL — ABNORMAL HIGH (ref 6–20)
BUN: 27 mg/dL — ABNORMAL HIGH (ref 6–20)
CO2: 26 mmol/L (ref 22–32)
CO2: 25 mmol/L (ref 22–32)
Calcium: 8.5 mg/dL — ABNORMAL LOW (ref 8.9–10.3)
Calcium: 8.5 mg/dL — ABNORMAL LOW (ref 8.9–10.3)
Chloride: 107 mmol/L (ref 98–111)
Chloride: 105 mmol/L (ref 98–111)
Creatinine, Ser: 1.84 mg/dL — ABNORMAL HIGH (ref 0.61–1.24)
Creatinine, Ser: 1.7 mg/dL — ABNORMAL HIGH (ref 0.61–1.24)
GFR, Estimated: 43 mL/min — ABNORMAL LOW (ref >60)
GFR, Estimated: 48 mL/min — ABNORMAL LOW (ref >60)
Glucose, Bld: 94 mg/dL (ref 70–99)
Glucose, Bld: 92 mg/dL (ref 70–99)
Potassium: 6.7 mmol/L (ref 3.5–5.1)
Potassium: 7.3 mmol/L (ref 3.5–5.1)
Sodium: 138 mmol/L (ref 135–145)
Sodium: 136 mmol/L (ref 135–145)

## 2021-02-16 LAB — POTASSIUM
Potassium: 6.4 mmol/L (ref 3.5–5.1)
Potassium: 6.1 mmol/L — ABNORMAL HIGH (ref 3.5–5.1)
Potassium: 5.9 mmol/L — ABNORMAL HIGH (ref 3.5–5.1)
Potassium: 7.3 mmol/L (ref 3.5–5.1)
Potassium: 6.1 mmol/L — ABNORMAL HIGH (ref 3.5–5.1)

## 2021-02-16 LAB — CBC
HCT: 25.4 % — ABNORMAL LOW (ref 39.0–52.0)
Hemoglobin: 7.1 g/dL — ABNORMAL LOW (ref 13.0–17.0)
MCH: 29.3 pg (ref 26.0–34.0)
MCHC: 28 g/dL — ABNORMAL LOW (ref 30.0–36.0)
MCV: 105 fL — ABNORMAL HIGH (ref 80.0–100.0)
Platelets: 263 10*3/uL (ref 150–400)
RBC: 2.42 MIL/uL — ABNORMAL LOW (ref 4.22–5.81)
RDW: 18.3 % — ABNORMAL HIGH (ref 11.5–15.5)
WBC: 6.7 10*3/uL (ref 4.0–10.5)
nRBC: 0 % (ref 0.0–0.2)

## 2021-02-16 LAB — FOLATE: Folate: 7 ng/mL (ref >5.9)

## 2021-02-16 LAB — GLUCOSE, CAPILLARY
Glucose-Capillary: 84 mg/dL (ref 70–99)
Glucose-Capillary: 98 mg/dL (ref 70–99)

## 2021-02-16 LAB — IRON AND TIBC
Iron: 23 ug/dL — ABNORMAL LOW (ref 45–182)
Saturation Ratios: 9 % — ABNORMAL LOW (ref 17.9–39.5)
TIBC: 254 ug/dL (ref 250–450)
UIBC: 231 ug/dL

## 2021-02-16 LAB — FERRITIN: Ferritin: 99 ng/mL (ref 24–336)

## 2021-02-16 LAB — OSMOLALITY, URINE: Osmolality, Ur: 317 mOsm/kg (ref 300–900)

## 2021-02-16 MED ORDER — FERROUS SULFATE 325 (65 FE) MG PO TABS
325.0000 mg | ORAL_TABLET | Freq: Every day | ORAL | Status: DC
  Administered 2021-02-17: 325 mg via ORAL
  Filled 2021-02-16: qty 1

## 2021-02-16 MED ORDER — DEXTROSE 50 % IV SOLN
1.0000 | Freq: Once | INTRAVENOUS | Status: AC
  Administered 2021-02-16: 50 mL via INTRAVENOUS
  Filled 2021-02-16: qty 50

## 2021-02-16 MED ORDER — INSULIN ASPART 100 UNIT/ML IV SOLN
5.0000 [IU] | Freq: Once | INTRAVENOUS | Status: AC
  Administered 2021-02-16: 5 [IU] via INTRAVENOUS

## 2021-02-16 MED ORDER — INSULIN ASPART 100 UNIT/ML IV SOLN
10.0000 [IU] | Freq: Once | INTRAVENOUS | Status: AC
  Administered 2021-02-16: 10 [IU] via INTRAVENOUS

## 2021-02-16 MED ORDER — VITAMIN B-12 1000 MCG PO TABS
1000.0000 ug | ORAL_TABLET | Freq: Every day | ORAL | Status: DC
  Administered 2021-02-17: 1000 ug via ORAL
  Filled 2021-02-16: qty 1

## 2021-02-16 MED ORDER — ALBUTEROL SULFATE (2.5 MG/3ML) 0.083% IN NEBU
10.0000 mg | INHALATION_SOLUTION | Freq: Once | RESPIRATORY_TRACT | Status: AC
  Administered 2021-02-16: 10 mg via RESPIRATORY_TRACT
  Filled 2021-02-16: qty 12

## 2021-02-16 MED ORDER — FUROSEMIDE 10 MG/ML IJ SOLN
40.0000 mg | Freq: Once | INTRAMUSCULAR | Status: AC
  Administered 2021-02-16: 40 mg via INTRAVENOUS
  Filled 2021-02-16: qty 4

## 2021-02-16 MED ORDER — CALCIUM GLUCONATE-NACL 1-0.675 GM/50ML-% IV SOLN
1.0000 g | Freq: Once | INTRAVENOUS | Status: AC
  Administered 2021-02-16: 1000 mg via INTRAVENOUS
  Filled 2021-02-16: qty 50

## 2021-02-16 MED ORDER — CYANOCOBALAMIN 1000 MCG/ML IJ SOLN
1000.0000 ug | Freq: Once | INTRAMUSCULAR | Status: AC
  Administered 2021-02-16: 1000 ug via SUBCUTANEOUS
  Filled 2021-02-16: qty 1

## 2021-02-16 MED ORDER — SODIUM CHLORIDE 0.9 % IV SOLN
510.0000 mg | Freq: Once | INTRAVENOUS | Status: AC
  Administered 2021-02-16: 510 mg via INTRAVENOUS
  Filled 2021-02-16: qty 510

## 2021-02-16 MED ORDER — SODIUM CHLORIDE 0.9 % IV SOLN: INTRAVENOUS | Status: DC

## 2021-02-16 MED ORDER — SODIUM POLYSTYRENE SULFONATE 15 GM/60ML PO SUSP
45.0000 g | Freq: Once | ORAL | Status: AC
  Administered 2021-02-16: 45 g via ORAL
  Filled 2021-02-16: qty 180

## 2021-02-16 MED ORDER — SODIUM CHLORIDE 0.9 % IV SOLN: 1.0000 g | Freq: Once | INTRAVENOUS | Status: DC

## 2021-02-16 NOTE — Plan of Care (Signed)

## 2021-02-16 NOTE — Progress Notes (Signed)
   02/16/21 0448  Provider Notification  Provider Name/Title Jeannette Corpus (provider on call)  Date Provider Notified 02/16/21  Time Provider Notified [phone]  Notification Type Page (asked to call nurse)  Notification Reason Critical result  Test performed and critical result K 6.7  Date Critical Result Received 02/16/21  Time Critical Result Received 8592  Provider response See new orders  Date of Provider Response 02/16/21  Time of Provider Response [phone]

## 2021-02-16 NOTE — Progress Notes (Signed)
Patient ambulatory to the bathroom for the third time this shift (received kayexalate during the night).  Patient concerned that he is noticeably short of breath. Oxygen saturation remains in the mid to high 90's on room air but patient says he feels like he did on a recent admission for heart failure/fluid overload. Notified MD of above, new orders received.

## 2021-02-16 NOTE — Progress Notes (Signed)
   02/16/21 0045  Provider Notification  Provider Name/Title Jeannette Corpus (provider on call)  Date Provider Notified 02/16/21  Time Provider Notified [phone]  Notification Type Page (asked to call nurse)  Notification Reason Critical result  Test performed and critical result K 6.4  Date Critical Result Received 02/16/21  Time Critical Result Received 0030  Provider response No new orders  Date of Provider Response 02/16/21  Time of Provider Response 0047 (and 0626)

## 2021-02-16 NOTE — Plan of Care (Signed)
  Problem: Education: Goal: Knowledge of General Education information will improve Description: Including pain rating scale, medication(s)/side effects and non-pharmacologic comfort measures Outcome: Progressing   Problem: Clinical Measurements: Goal: Cardiovascular complication will be avoided Outcome: Progressing   Problem: Coping: Goal: Level of anxiety will decrease Outcome: Progressing   Problem: Pain Managment: Goal: General experience of comfort will improve Outcome: Progressing   Problem: Safety: Goal: Ability to remain free from injury will improve Outcome: Progressing   Problem: Skin Integrity: Goal: Risk for impaired skin integrity will decrease Outcome: Progressing

## 2021-02-16 NOTE — Progress Notes (Signed)
PROGRESS NOTE  Keith [name] YQM:[id number] DOB: 16-Sep-1966 DOA: 02/15/2021 PCP: [name], MD  HPI/Recap of past 24 hours: HPI from Dr Neysa Bonito 55 year old male with past medical history of diabetes, metastatic renal cell carcinoma to lung and bone s/p radical nephrectomy with cancer related pain currently on morphine intrathecal pump, PE previously on Xarelto which was discontinued on recent hospitalization due to GI bleeding, CKD 3 a, combined systolic and diastolic heart failure who presented to the ED with abnormal lab work after referral from PCP.  Patient had blood drawn from his PCP office and was found to have hyperkalemia. Of note, he was recently hospitalized from 2/10-2/16 for acute hypoxic respiratory failure secondary to heart failure exacerbation and was continued on Coreg, losartan, Aldactone, lasix. Also noted to have symptomatic anemia with history of GI bleed and received 1 unit PRBCs during that hospitalization and was recommended to stop his Xarelto indefinitely. In the ED, VSS. Notable Labs: Sodium 138, K7.4, BUN 34, creatinine 1.95, Hb 8.5, COVID-19 negative. Patient received albuterol, NovoLog 10 units, D50, calcium gluconate.  Patient admitted for further management.     Today, patient denies any new complaints, as any chest pain, abdominal pain, nausea/vomiting, fever/chills.  Denies any recent change in diet.    Assessment/Plan: Principal Problem:   Hyperkalemia Active Problems:   Malignant tumor of kidney (HCC)   Benign essential hypertension   Hyperkalemia On admission K 7.4 Likely medication induced with underlying CKD, no recent diet changes Status post albuterol/insulin/D50/calcium gluconate in the ED Hold home spironolactone, losartan for now Continue Lokelma, home Lasix Gentle hydration Frequent potassium levels Telemetry  AKI on CKD stage IIIa Creatinine around 1.3-1.5, on admission 1.95 Appears dry Gentle hydration Daily BMP  Chronic  systolic/diastolic HF Appears euvolemic/somewhat dry Continue home Coreg, Lasix for now due to hyperkalemia Continue to hold losartan, spironolactone Strict I's and O's, daily weights  Anemia of chronic kidney disease/macrocytic anemia Vitamin B12 deficiency Baseline hemoglobin around 8 Anemia panel showed iron 23, sats 9, vitamin B12 178 Feraheme x1 on 02/16/2021 [place] cyanocobalamin + oral B12 supplementation Continue oral iron supplementation Daily CBC  Metastatic renal cell carcinoma to lung, bone s/p radical nephrectomy Cancer related pain Continue home pain regimen with intrathecal pump  History of PE Xarelto discontinued due to history of GI bleed and symptomatic anemia    Estimated body mass index is 19.86 kg/m as calculated from the following:   Height as of this encounter: 6\' 4"  (1.93 m).   Weight as of this encounter: 74 kg.     Code Status: Full  Family Communication: None at bedside  Disposition Plan: Status is: Inpatient  Remains inpatient appropriate because:Inpatient level of care appropriate due to severity of illness   Dispo: The patient is from: Home              Anticipated d/c is to: Home              Patient currently is not medically stable to d/c.   Difficult to place patient No    Consultants:  None  Procedures:  None  Antimicrobials:  None  DVT prophylaxis: SCDs for now   Objective: Vitals:   02/16/21 0600 02/16/21 0700 02/16/21 0753 02/16/21 0814  BP: 117/73 120/78    Pulse:      Resp: 16 16    Temp:      TempSrc:      SpO2:   100% 94%  Weight:      Height:  Intake/Output Summary (Last 24 hours) at 02/16/2021 1034 Last data filed at 02/16/2021 0536 Gross per 24 hour  Intake 1401.67 ml  Output 900 ml  Net 501.67 ml   Filed Weights   02/15/21 1121  Weight: 74 kg    Exam:  General: NAD  Cardiovascular: S1, S2 present  Respiratory: CTAB  Abdomen: Soft, nontender, nondistended, bowel sounds  present  Musculoskeletal: +1 bilateral pedal edema noted  Skin: Normal  Psychiatry: Normal mood    Data Reviewed: CBC: Recent Labs  Lab 02/15/21 1215 02/16/21 0333  WBC 7.4 6.7  NEUTROABS 5.5  --   HGB 8.5* 7.1*  HCT 29.8* 25.4*  MCV 104.9* 105.0*  PLT 309 902   Basic Metabolic Panel: Recent Labs  Lab 02/15/21 1215 02/15/21 1600 02/15/21 1813 02/15/21 2315 02/16/21 0333 02/16/21 0703  NA 138 138  --   --  138  --   K 7.4* 6.5* 7.2* 6.4* 6.7* 6.1*  CL 106 108  --   --  107  --   CO2 26 25  --   --  26  --   GLUCOSE 106* 62*  --   --  94  --   BUN 34* 31*  --   --  31*  --   CREATININE 1.95* 1.88*  --   --  1.84*  --   CALCIUM 9.0 11.0*  --   --  8.5*  --    GFR: Estimated Creatinine Clearance: 48.6 mL/min (A) (by C-G formula based on SCr of 1.84 mg/dL (H)). Liver Function Tests: Recent Labs  Lab 02/15/21 1215  AST 23  ALT 36  ALKPHOS 102  BILITOT 0.4  PROT 6.9  ALBUMIN 2.6*   No results for input(s): LIPASE, AMYLASE in the last 168 hours. No results for input(s): AMMONIA in the last 168 hours. Coagulation Profile: No results for input(s): INR, PROTIME in the last 168 hours. Cardiac Enzymes: No results for input(s): CKTOTAL, CKMB, CKMBINDEX, TROPONINI in the last 168 hours. BNP (last 3 results) No results for input(s): PROBNP in the last 8760 hours. HbA1C: No results for input(s): HGBA1C in the last 72 hours. CBG: Recent Labs  Lab 02/15/21 1658 02/15/21 2120 02/15/21 2221 02/16/21 0534 02/16/21 0630  GLUCAP 78 129* 126* 84 98   Lipid Profile: No results for input(s): CHOL, HDL, LDLCALC, TRIG, CHOLHDL, LDLDIRECT in the last 72 hours. Thyroid Function Tests: No results for input(s): TSH, T4TOTAL, FREET4, T3FREE, THYROIDAB in the last 72 hours. Anemia Panel: Recent Labs    02/16/21 0720  VITAMINB12 178*  FOLATE 7.0  FERRITIN 99  TIBC 254  IRON 23*   Urine analysis:    Component Value Date/Time   COLORURINE YELLOW 01/31/2021 0928    APPEARANCEUR CLEAR 01/31/2021 0928   LABSPEC 1.008 01/31/2021 0928   PHURINE 5.0 01/31/2021 0928   GLUCOSEU NEGATIVE 01/31/2021 0928   HGBUR SMALL (A) 01/31/2021 0928   BILIRUBINUR NEGATIVE 01/31/2021 0928   KETONESUR NEGATIVE 01/31/2021 0928   PROTEINUR 100 (A) 01/31/2021 0928   UROBILINOGEN 0.2 09/20/2015 1547   NITRITE NEGATIVE 01/31/2021 0928   LEUKOCYTESUR NEGATIVE 01/31/2021 0928   Sepsis Labs: @LABRCNTIP (procalcitonin:4,lacticidven:4)  ) Recent Results (from the past 240 hour(s))  Resp Panel by RT-PCR (Flu A&B, Covid) Nasopharyngeal Swab     Status: None   Collection Time: 02/15/21  1:45 PM   Specimen: Nasopharyngeal Swab; Nasopharyngeal(NP) swabs in vial transport medium  Result Value Ref Range Status   SARS Coronavirus 2 by RT PCR NEGATIVE  NEGATIVE Final    Comment: (NOTE) SARS-CoV-2 target nucleic acids are NOT DETECTED.  The SARS-CoV-2 RNA is generally detectable in upper respiratory specimens during the acute phase of infection. The lowest concentration of SARS-CoV-2 viral copies this assay can detect is 138 copies/mL. A negative result does not preclude SARS-Cov-2 infection and should not be used as the sole basis for treatment or other patient management decisions. A negative result may occur with  improper specimen collection/handling, submission of specimen other than nasopharyngeal swab, presence of viral mutation(s) within the areas targeted by this assay, and inadequate number of viral copies(<138 copies/mL). A negative result must be combined with clinical observations, patient history, and epidemiological information. The expected result is Negative.  Fact Sheet for Patients:  EntrepreneurPulse.com.au  Fact Sheet for Healthcare Providers:  IncredibleEmployment.be  This test is no t yet approved or cleared by the [place] FDA and  has been authorized for detection and/or diagnosis of SARS-CoV-2 by FDA under an  Emergency Use Authorization (EUA). This EUA will remain  in effect (meaning this test can be used) for the duration of the COVID-19 declaration under Section 564(b)(1) of the Act, 21 U.S.C.section 360bbb-3(b)(1), unless the authorization is terminated  or revoked sooner.       Influenza A by PCR NEGATIVE NEGATIVE Final   Influenza B by PCR NEGATIVE NEGATIVE Final    Comment: (NOTE) The Xpert Xpress SARS-CoV-2/FLU/RSV plus assay is intended as an aid in the diagnosis of influenza from Nasopharyngeal swab specimens and should not be used as a sole basis for treatment. Nasal washings and aspirates are unacceptable for Xpert Xpress SARS-CoV-2/FLU/RSV testing.  Fact Sheet for Patients: EntrepreneurPulse.com.au  Fact Sheet for Healthcare Providers: IncredibleEmployment.be  This test is not yet approved or cleared by the [place] FDA and has been authorized for detection and/or diagnosis of SARS-CoV-2 by FDA under an Emergency Use Authorization (EUA). This EUA will remain in effect (meaning this test can be used) for the duration of the COVID-19 declaration under Section 564(b)(1) of the Act, 21 U.S.C. section 360bbb-3(b)(1), unless the authorization is terminated or revoked.  Performed at [facility], [place] [address], [place], [place] [address]       Studies: DG Chest Port 1 View  Result Date: 02/16/2021 CLINICAL DATA:  55 year old male with shortness of breath. Metastatic renal cell carcinoma. EXAM: PORTABLE CHEST 1 VIEW COMPARISON:  Portable chest 02/01/2021. Restaging CT Chest, Abdomen, and Pelvis 01/31/2021. FINDINGS: Portable AP semi upright view at 0704 hours. Veiling left pleural effusion not significantly changed from the restaging CTs earlier this month. Trace right pleural effusion or pleural scarring. Left hilar mass radiographically stable. Left lower lobe collapse or consolidation does appear mildly progressed  from the CTs. Other mediastinal contours are stable. Spiculated architectural distortion in the right upper lung is stable. No pneumothorax. No pulmonary edema. Visualized tracheal air column is within normal limits. Stable visualized osseous structures. IMPRESSION: 1. Left pleural effusion not significantly changed from the restaging CTs earlier this month, although associated left lower lobe collapse or consolidation does appear increased. 2. Left hilar mass radiographically stable. Right upper lobe spiculated mass versus architectural distortion is stable. Electronically Signed   By: Genevie Ann M.D.   On: 02/16/2021 07:38    Scheduled Meds: . carvedilol  12.5 mg Oral BID WC  . furosemide  20 mg Oral Daily  . sodium chloride flush  3 mL Intravenous Q12H  . sodium zirconium cyclosilicate  10 g Oral BID  Continuous Infusions: [name] Morphine Intrathecal Pain Pump       LOS: 1 day     Alma Friendly, MD [facility]  If 7PM-7AM, please contact night-coverage [email or web address] 02/16/2021, 10:34 AM

## 2021-02-16 NOTE — Progress Notes (Signed)
   02/15/21 2050  Provider Notification  Provider Name/Title Jeannette Corpus (On call provider)  Date Provider Notified 02/15/21  Time Provider Notified 2050  Notification Type Page (Asked to call nurse)  Notification Reason Critical result  Test performed and critical result K 7.2  Date Critical Result Received 02/15/21  Time Critical Result Received 2040  Provider response See new orders  Date of Provider Response 02/15/21  Time of Provider Response 2056

## 2021-02-16 NOTE — Progress Notes (Signed)
Date and time results received: 02/16/21 1535 (use smartphrase ".now" to insert current time)  Test: K+  Critical Value: 7.3   Name of Provider Notified: [place]   Orders Received? Or Actions Taken?: new orders received

## 2021-02-17 LAB — POTASSIUM
Potassium: 5.2 mmol/L — ABNORMAL HIGH (ref 3.5–5.1)
Potassium: 5.3 mmol/L — ABNORMAL HIGH (ref 3.5–5.1)
Potassium: 5.4 mmol/L — ABNORMAL HIGH (ref 3.5–5.1)

## 2021-02-17 LAB — BASIC METABOLIC PANEL
Anion gap: 8 (ref 5–15)
BUN: 27 mg/dL — ABNORMAL HIGH (ref 6–20)
CO2: 24 mmol/L (ref 22–32)
Calcium: 8.5 mg/dL — ABNORMAL LOW (ref 8.9–10.3)
Chloride: 106 mmol/L (ref 98–111)
Creatinine, Ser: 1.81 mg/dL — ABNORMAL HIGH (ref 0.61–1.24)
GFR, Estimated: 44 mL/min — ABNORMAL LOW (ref >60)
Glucose, Bld: 96 mg/dL (ref 70–99)
Potassium: 5.3 mmol/L — ABNORMAL HIGH (ref 3.5–5.1)
Sodium: 138 mmol/L (ref 135–145)

## 2021-02-17 LAB — PREPARE RBC (CROSSMATCH)

## 2021-02-17 LAB — CBC
HCT: 23.7 % — ABNORMAL LOW (ref 39.0–52.0)
HCT: 29.4 % — ABNORMAL LOW (ref 39.0–52.0)
Hemoglobin: 6.9 g/dL — CL (ref 13.0–17.0)
Hemoglobin: 8.8 g/dL — ABNORMAL LOW (ref 13.0–17.0)
MCH: 29.7 pg (ref 26.0–34.0)
MCH: 30.2 pg (ref 26.0–34.0)
MCHC: 29.1 g/dL — ABNORMAL LOW (ref 30.0–36.0)
MCHC: 29.9 g/dL — ABNORMAL LOW (ref 30.0–36.0)
MCV: 102.2 fL — ABNORMAL HIGH (ref 80.0–100.0)
MCV: 101 fL — ABNORMAL HIGH (ref 80.0–100.0)
Platelets: 289 10*3/uL (ref 150–400)
Platelets: 352 10*3/uL (ref 150–400)
RBC: 2.32 MIL/uL — ABNORMAL LOW (ref 4.22–5.81)
RBC: 2.91 MIL/uL — ABNORMAL LOW (ref 4.22–5.81)
RDW: 18.5 % — ABNORMAL HIGH (ref 11.5–15.5)
RDW: 18 % — ABNORMAL HIGH (ref 11.5–15.5)
WBC: 7.3 10*3/uL (ref 4.0–10.5)
WBC: 6.2 10*3/uL (ref 4.0–10.5)
nRBC: 0 % (ref 0.0–0.2)
nRBC: 0 % (ref 0.0–0.2)

## 2021-02-17 MED ORDER — SODIUM CHLORIDE 0.9% IV SOLUTION: Freq: Once | INTRAVENOUS | Status: AC

## 2021-02-17 NOTE — Discharge Summary (Signed)
Discharge Summary  KEYANDRE PILEGGI OVF:[id number] DOB: 1966-03-30  PCP: [name], MD  Admit date: 02/15/2021 Discharge date: 02/17/2021  Time spent: 40 mins  Recommendations for Outpatient Follow-up:  1. PCP in 1 week to follow-up labs and medication adjustment 2. Cardiology as scheduled 3. Oncology as scheduled    Discharge Diagnoses:  Active Hospital Problems   Diagnosis Date Noted  . Hyperkalemia 02/15/2021  . Benign essential hypertension 11/13/2015  . Malignant tumor of kidney ([place]) 08/12/2011    Resolved Hospital Problems  No resolved problems to display.    Discharge Condition: Stable  Diet recommendation: Renal diet/low potassium  Vitals:   02/17/21 0900 02/17/21 1305  BP: 121/73 123/82  Pulse: 76 72  Resp: 15 14  Temp:  98.6 F (37 C)  SpO2: 97% 100%    History of present illness:  55 year old male with past medical history of diabetes,metastatic renal cell carcinoma to lung and bone s/p radical nephrectomy with cancer related pain currently on morphine intrathecal pump, PE previously on Xarelto which was discontinued on recent hospitalization due to GI bleeding, CKD 3 a, combined systolic and diastolic heart failure who presented to the ED with abnormal lab work after referral from PCP. Patient had blood drawn from his PCP office and was found to have hyperkalemia. Of note, he was recently hospitalized from 2/10-2/16 for acute hypoxic respiratory failure secondary to heart failure exacerbation and was continued on Coreg, losartan, Aldactone, lasix. Also noted to have symptomatic anemia with history of GI bleed and received 1 unit PRBCs during that hospitalization and was recommended to stop his Xarelto indefinitely. In the ED, VSS. Notable Labs:Sodium 138, K7.4, BUN 34, creatinine 1.95, Hb 8.5, COVID-19 negative.Patient received albuterol, NovoLog 10 units, D50, calcium gluconate.  Patient admitted for further management.    Today, patient denies any  new complaints.  Denies any chest pain, shortness of breath, abdominal pain, nausea/vomiting, fever/chills.  Patient advised to be compliant with his medications and appointments.  Patient will be given a renal diet/low potassium sheet to follow closely.  Patient very eager to be discharged as he has upcoming oncology appointment on 02/18/2021  Hospital Course:  Principal Problem:   Hyperkalemia Active Problems:   Malignant tumor of kidney ([place])   Benign essential hypertension   Hyperkalemia On admission K 7.4, upon d/c 5.2 Likely medication induced with underlying CKD Status post albuterol/insulin/D50/calcium gluconate, lokelma, Kayexalate, IVF, lasix Continue to hold home spironolactone, losartan, until seen by PCP/cardiology after repeat labs done Continue home Lasix Follow up with PCP with repeat labs  AKI on CKD stage IIIa Creatinine around 1.3-1.5, on admission 1.95 S/P gentle IV hydration, encouraged adequate oral hydration Follow-up with PCP with repeat labs  Chronic systolic/diastolic HF Appears euvolemic/somewhat dry Continue home Coreg Continue to hold losartan, spironolactone as mentioned above Follow-up with cardiology  Anemia of chronic kidney disease/macrocytic anemia Vitamin B12 deficiency Baseline hemoglobin around 8 Anemia panel showed iron 23, sats 9, vitamin B12 178 Feraheme x1 on 02/16/2021 Continue oral iron and B12 supplementation Follow-up with PCP  Metastatic renal cell carcinoma to lung, bone s/p radical nephrectomy Cancer related pain Continue home pain regimen with intrathecal pump  History of PE Xarelto discontinued due to history of GI bleed and symptomatic anemia      Estimated body mass index is 19.75 kg/m as calculated from the following:   Height as of this encounter: 6\' 4"  (1.93 m).   Weight as of this encounter: 73.6 kg.    Procedures:  None  Consultations:  None    Discharge Exam: BP 123/82   Pulse 72   Temp  98.6 F (37 C) (Axillary)   Resp 14   Ht 6\' 4"  (1.93 m)   Wt 73.6 kg Comment: bedscale  SpO2 100%   BMI 19.75 kg/m   General: NAD Cardiovascular: S1, S2 present Respiratory: CTA B     Discharge Instructions You were cared for by a hospitalist during your hospital stay. If you have any questions about your discharge medications or the care you received while you were in the hospital after you are discharged, you can call the unit and asked to speak with the hospitalist on call if the hospitalist that took care of you is not available. Once you are discharged, your primary care physician will handle any further medical issues. Please note that NO REFILLS for any discharge medications will be authorized once you are discharged, as it is imperative that you return to your primary care physician (or establish a relationship with a primary care physician if you do not have one) for your aftercare needs so that they can reassess your need for medications and monitor your lab values.  Discharge Instructions    Diet - low sodium heart healthy   Complete by: As directed    Increase activity slowly   Complete by: As directed      Allergies as of 02/17/2021      Reactions   Ceftriaxone Hives   Other reaction(s): red streaks   Hydrocodone Swelling      Medication List    STOP taking these medications   losartan 25 MG tablet Commonly known as: COZAAR   spironolactone 25 MG tablet Commonly known as: ALDACTONE     TAKE these medications   ALPRAZolam 1 MG tablet Commonly known as: XANAX TAKE 1 TABLET BY MOUTH THREE TIMES A DAY AS NEEDED FOR ANXIETY   B-12 1000 MCG Tabs Take 1 tablet (1,000 mcg total) by mouth daily.   carvedilol 12.5 MG tablet Commonly known as: COREG Take 1 tablet (12.5 mg total) by mouth 2 (two) times daily with a meal.   diphenhydrAMINE 25 MG tablet Commonly known as: BENADRYL Take 25 mg by mouth every 6 (six) hours as needed for allergies.    diphenhydramine-acetaminophen 25-500 MG Tabs tablet Commonly known as: TYLENOL PM Take 1 tablet by mouth at bedtime as needed (pain).   ferrous sulfate 325 (65 FE) MG tablet TAKE 1 TABLET BY MOUTH EVERY DAY WITH BREAKFAST What changed: See the new instructions.   furosemide 20 MG tablet Commonly known as: LASIX TAKE 1 TABLET BY MOUTH EVERY DAY   HYDROmorphone 8 MG tablet Commonly known as: DILAUDID Take 1 tablet (8 mg total) by mouth every 4 (four) hours as needed for severe pain. What changed: reasons to take this   Magnesium Oxide 400 MG Caps Take 1 capsule (400 mg total) by mouth daily.   ondansetron 4 MG tablet Commonly known as: ZOFRAN Take one tablet every morning.  Can take one tablet later in the day on an as needed basis What changed:   how much to take  how to take this  when to take this  additional instructions   PAIN MANAGEMENT INTRATHECAL (IT) PUMP 1 each by Intrathecal route continuous. Intrathecal (IT) medication:   Morphine.   pantoprazole 40 MG tablet Commonly known as: PROTONIX Take 1 tablet (40 mg total) by mouth daily.      Allergies  Allergen Reactions  .  Ceftriaxone Hives    Other reaction(s): red streaks  . Hydrocodone Swelling    Follow-up Information    [name], MD. Schedule an appointment as soon as possible for a visit in 1 week(s).   Specialty: Internal Medicine Contact information: [address] [place] [address] [phone]        Geralynn Rile, MD .   Specialties: Internal Medicine, Cardiology, Radiology Contact information: [place] [address] [phone]                The results of significant diagnostics from this hospitalization (including imaging, microbiology, ancillary and laboratory) are listed below for reference.    Significant Diagnostic Studies: CT ABDOMEN PELVIS W WO CONTRAST  Result Date: 01/31/2021 CLINICAL DATA:  Restaging  metastatic left renal cancer, prior nephrectomy. Discontinuation of oral chemotherapy. Shortness of breath. EXAM: CT CHEST WITH CONTRAST CT ABDOMEN AND PELVIS WITH AND WITHOUT CONTRAST TECHNIQUE: Multidetector CT imaging of the chest was performed during intravenous contrast administration. Multidetector CT imaging of the abdomen and pelvis was performed following the standard protocol before and during bolus administration of intravenous contrast. CONTRAST:  4mL OMNIPAQUE IOHEXOL 300 MG/ML  SOLN COMPARISON:  11/20/2020 FINDINGS: CT CHEST FINDINGS Cardiovascular: No significant thoracic atherosclerosis. Please note that today's exam was not performed as a CT angiogram and is not considered highly sensitive for pulmonary embolus. Stable small pericardial effusion. Mediastinum/Nodes: 1.3 by 0.9 cm hypodense nodule anteriorly in the right thyroid lobe on image 6 of series 12. Not clinically significant; no follow-up imaging recommended (ref: J Am Coll Radiol. 2015 Feb;12(2): 143-50). There is background stranding in the mediastinum which matches the background stranding in the subcutaneous tissues and likely reflects third spacing of fluid. Lungs/Pleura: Predominantly left lower lobe perihilar mass measures 4.3 by 3.4 cm on image 50 of series 12, previously 3.1 by 3.2 cm by my measurements. Right middle lobe and right upper lobe peribronchovascular mass measures 2.4 cm along its short axis, previously 2.1 cm. Stable thick bandlike scarring with volume loss in the right upper lobe. Underlying emphysema. Scattered scarring in the lungs. There is some hazy ground-glass opacity in the lungs which is increased from prior and could be a subtle manifestation of edema or third spacing of fluid. Moderate to large left pleural effusion has increased from prior. Small right pleural effusion appear stable. Musculoskeletal: Thoracic spondylosis. Stable small anterior sclerotic lesion in the T2 vertebral body. Anomalous medial left  second rib. Observe 0 catheter noted terminating at about the T4 level posteriorly. CT ABDOMEN AND PELVIS FINDINGS Hepatobiliary: No well delineated hepatic lesions are observed. Gallbladder unremarkable. Pancreas: Dorsal pancreatic duct dilatation extending to a pancreatic body mass with enhancing portion of this mass measuring 3.6 by 2.0 cm on image 41 of series 7, previously 3.6 by 1.9 cm. Along the anterior margin of the pancreatic head, a 3.1 by 2.3 cm mass is present and previously measured 2.8 by 2.4 cm. The pancreatic lesions are roughly stable. Questionable third small pancreatic nodule along the pancreatic head on image 54 of series 7 measuring 0.9 cm in diameter, stable. Possible fourth pancreatic nodule along the anterior portion of the pancreatic tail measuring 1.0 cm in diameter, stable. Spleen: Unremarkable Adrenals/Urinary Tract: Left nephrectomy. Adrenal glands unremarkable. The right kidney appears unremarkable. No urinary tract calculi are observed. Urinary bladder unremarkable. Stomach/Bowel: No dilated bowel. Similar appearance of mild diffuse rectal wall thickening compared to previous, possibly incidental. Vascular/Lymphatic: Aortoiliac atherosclerotic vascular disease. No pathologic  adenopathy. Reproductive: Unremarkable Other: Diffuse subcutaneous and mesenteric edema suggesting third spacing of fluid. Trace free pelvic fluid, improved from prior. Diffuse stranding in the perirectal space. Musculoskeletal: Left lower abdominal wall infusion pump. Necks chloride akin lucent lesion of the right sacral ala probably representing a metastatic lesion and not appreciably changed from prior. Mild sclerosis along the SI joints. Right proximal femur ORIF, with a lucency in the right proximal femur shown on image 76 of series 13 and also noted on prior exam. Heterotopic ossification above the greater trochanter. Sharply defined lytic lesion of the right inferior pubic ramus measuring 2.4 by 1.3 cm on  image 217 of series 12 with sclerotic margination, previously 2.2 by 1.3 cm. IMPRESSION: 1. Mild enlargement of the left lower lobe perihilar mass, and of the right middle lobe/right upper lobe peribronchovascular mass. Increased left pleural effusion, now moderate to large. Stable small right pleural effusion. Stable small pericardial effusion. 2. Diffuse subcutaneous, mesenteric, and mediastinal edema compatible with third spacing of fluid. There is some hazy density in the lungs which also may be due to noncardiogenic edema. 3. Stable appearance of multiple pancreatic masses. 4. Stable osseous manifestations of metastatic disease, with the largest lesion in the right sacrum. 5. Emphysema and aortic atherosclerosis. Aortic Atherosclerosis (ICD10-I70.0) and Emphysema (ICD10-J43.9). Electronically Signed   By: Van Clines M.D.   On: 01/31/2021 10:56   DG Chest 2 View  Result Date: 01/31/2021 CLINICAL DATA:  Shortness of breath.  Metastatic renal cell cancer. EXAM: CHEST - 2 VIEW COMPARISON:  06/30/2020 FINDINGS: Interval progression of spiculated nodular opacity in the suprahilar right lung with progressive right infrahilar opacity in this patient with known metastatic renal cell carcinoma. New left base collapse/consolidation with small left pleural effusion. Tiny right pleural effusion is similar to prior. The cardio pericardial silhouette is enlarged. The visualized bony structures of the thorax show no acute abnormality. IMPRESSION: 1. Interval progression of right suprahilar and right infrahilar pulmonary metastatic disease. 2. New left base collapse/consolidation with small left pleural effusion. Findings may be related to metastatic involvement or pneumonia. Electronically Signed   By: Misty Stanley M.D.   On: 01/31/2021 06:24   CT CHEST W CONTRAST  Result Date: 01/31/2021 CLINICAL DATA:  Restaging metastatic left renal cancer, prior nephrectomy. Discontinuation of oral chemotherapy. Shortness  of breath. EXAM: CT CHEST WITH CONTRAST CT ABDOMEN AND PELVIS WITH AND WITHOUT CONTRAST TECHNIQUE: Multidetector CT imaging of the chest was performed during intravenous contrast administration. Multidetector CT imaging of the abdomen and pelvis was performed following the standard protocol before and during bolus administration of intravenous contrast. CONTRAST:  37mL OMNIPAQUE IOHEXOL 300 MG/ML  SOLN COMPARISON:  11/20/2020 FINDINGS: CT CHEST FINDINGS Cardiovascular: No significant thoracic atherosclerosis. Please note that today's exam was not performed as a CT angiogram and is not considered highly sensitive for pulmonary embolus. Stable small pericardial effusion. Mediastinum/Nodes: 1.3 by 0.9 cm hypodense nodule anteriorly in the right thyroid lobe on image 6 of series 12. Not clinically significant; no follow-up imaging recommended (ref: J Am Coll Radiol. 2015 Feb;12(2): 143-50). There is background stranding in the mediastinum which matches the background stranding in the subcutaneous tissues and likely reflects third spacing of fluid. Lungs/Pleura: Predominantly left lower lobe perihilar mass measures 4.3 by 3.4 cm on image 50 of series 12, previously 3.1 by 3.2 cm by my measurements. Right middle lobe and right upper lobe peribronchovascular mass measures 2.4 cm along its short axis, previously 2.1 cm. Stable thick bandlike scarring with  volume loss in the right upper lobe. Underlying emphysema. Scattered scarring in the lungs. There is some hazy ground-glass opacity in the lungs which is increased from prior and could be a subtle manifestation of edema or third spacing of fluid. Moderate to large left pleural effusion has increased from prior. Small right pleural effusion appear stable. Musculoskeletal: Thoracic spondylosis. Stable small anterior sclerotic lesion in the T2 vertebral body. Anomalous medial left second rib. Observe 0 catheter noted terminating at about the T4 level posteriorly. CT ABDOMEN  AND PELVIS FINDINGS Hepatobiliary: No well delineated hepatic lesions are observed. Gallbladder unremarkable. Pancreas: Dorsal pancreatic duct dilatation extending to a pancreatic body mass with enhancing portion of this mass measuring 3.6 by 2.0 cm on image 41 of series 7, previously 3.6 by 1.9 cm. Along the anterior margin of the pancreatic head, a 3.1 by 2.3 cm mass is present and previously measured 2.8 by 2.4 cm. The pancreatic lesions are roughly stable. Questionable third small pancreatic nodule along the pancreatic head on image 54 of series 7 measuring 0.9 cm in diameter, stable. Possible fourth pancreatic nodule along the anterior portion of the pancreatic tail measuring 1.0 cm in diameter, stable. Spleen: Unremarkable Adrenals/Urinary Tract: Left nephrectomy. Adrenal glands unremarkable. The right kidney appears unremarkable. No urinary tract calculi are observed. Urinary bladder unremarkable. Stomach/Bowel: No dilated bowel. Similar appearance of mild diffuse rectal wall thickening compared to previous, possibly incidental. Vascular/Lymphatic: Aortoiliac atherosclerotic vascular disease. No pathologic adenopathy. Reproductive: Unremarkable Other: Diffuse subcutaneous and mesenteric edema suggesting third spacing of fluid. Trace free pelvic fluid, improved from prior. Diffuse stranding in the perirectal space. Musculoskeletal: Left lower abdominal wall infusion pump. Necks chloride akin lucent lesion of the right sacral ala probably representing a metastatic lesion and not appreciably changed from prior. Mild sclerosis along the SI joints. Right proximal femur ORIF, with a lucency in the right proximal femur shown on image 76 of series 13 and also noted on prior exam. Heterotopic ossification above the greater trochanter. Sharply defined lytic lesion of the right inferior pubic ramus measuring 2.4 by 1.3 cm on image 217 of series 12 with sclerotic margination, previously 2.2 by 1.3 cm. IMPRESSION: 1. Mild  enlargement of the left lower lobe perihilar mass, and of the right middle lobe/right upper lobe peribronchovascular mass. Increased left pleural effusion, now moderate to large. Stable small right pleural effusion. Stable small pericardial effusion. 2. Diffuse subcutaneous, mesenteric, and mediastinal edema compatible with third spacing of fluid. There is some hazy density in the lungs which also may be due to noncardiogenic edema. 3. Stable appearance of multiple pancreatic masses. 4. Stable osseous manifestations of metastatic disease, with the largest lesion in the right sacrum. 5. Emphysema and aortic atherosclerosis. Aortic Atherosclerosis (ICD10-I70.0) and Emphysema (ICD10-J43.9). Electronically Signed   By: Van Clines M.D.   On: 01/31/2021 10:56   US RENAL  Result Date: 02/16/2021 CLINICAL DATA:  Acute renal insufficiency EXAM: RENAL / URINARY TRACT ULTRASOUND COMPLETE COMPARISON:  CT of the abdomen pelvis January 31, 2021 FINDINGS: Right Kidney: Renal measurements: 12.3 x 5.1 x 6.0 cm = volume: 197 mL. Echogenicity within normal limits. No mass or hydronephrosis visualized. Left Kidney: Surgically absent Bladder: Irregular thickening of the right hand side of the urinary bladder noted. Other: None. IMPRESSION: 1. Normal appearance of the right kidney. 2. Surgical absence of the left kidney. 3. Irregular thickening of the right hand side of the urinary bladder. Given patient's history this may represent metastatic disease, or cystitis. Electronically Signed  By: Fidela Salisbury M.D.   On: 02/16/2021 17:18   DG Chest Port 1 View  Result Date: 02/16/2021 CLINICAL DATA:  55 year old male with history of shortness of breath. EXAM: PORTABLE CHEST 1 VIEW COMPARISON:  Chest x-ray 02/16/2019. FINDINGS: Multiple bilateral pulmonary nodules and masses are again noted, bulkiest of which appears in the left perihilar region corresponding to large central left lower lobe lesion better demonstrated  on prior chest CT 01/31/2021. Moderate bilateral pleural effusions with bibasilar opacities which may reflect areas of atelectasis and/or consolidation, similar to the recent prior examination. No pneumothorax. No evidence of pulmonary edema. Heart size is normal. IMPRESSION: 1. Widespread metastatic disease to the lungs redemonstrated with moderate bilateral pleural effusions measure roughly stable in size compared to the prior examination, and associated with persistent areas of bibasilar atelectasis and/or consolidation. Electronically Signed   By: Vinnie Langton M.D.   On: 02/16/2021 16:12   DG Chest Port 1 View  Result Date: 02/16/2021 CLINICAL DATA:  55 year old male with shortness of breath. Metastatic renal cell carcinoma. EXAM: PORTABLE CHEST 1 VIEW COMPARISON:  Portable chest 02/01/2021. Restaging CT Chest, Abdomen, and Pelvis 01/31/2021. FINDINGS: Portable AP semi upright view at 0704 hours. Veiling left pleural effusion not significantly changed from the restaging CTs earlier this month. Trace right pleural effusion or pleural scarring. Left hilar mass radiographically stable. Left lower lobe collapse or consolidation does appear mildly progressed from the CTs. Other mediastinal contours are stable. Spiculated architectural distortion in the right upper lung is stable. No pneumothorax. No pulmonary edema. Visualized tracheal air column is within normal limits. Stable visualized osseous structures. IMPRESSION: 1. Left pleural effusion not significantly changed from the restaging CTs earlier this month, although associated left lower lobe collapse or consolidation does appear increased. 2. Left hilar mass radiographically stable. Right upper lobe spiculated mass versus architectural distortion is stable. Electronically Signed   By: Genevie Ann M.D.   On: 02/16/2021 07:38   DG CHEST PORT 1 VIEW  Result Date: 02/01/2021 CLINICAL DATA:  Status post left thoracentesis. EXAM: PORTABLE CHEST 1 VIEW  COMPARISON:  CT chest and chest radiograph from January 31, 2021. FINDINGS: Right suprahilar and left infrahilar metastatic disease, better characterized on recent CT chest. Interval left thoracentesis with decreased size of a layering small left pleural effusion. Hazy left basilar opacity. No visible pneumothorax on this semi erect radiograph. Similar small right pleural effusion. Similar enlarged cardiac silhouette. IMPRESSION: 1. Interval left thoracentesis with decreased layering small left pleural effusion. Hazy left basilar opacity likely is secondary to layering pleural effusion and atelectasis. No visible pneumothorax on this semi erect radiograph. 2. Similar small right pleural effusion. 3. Right suprahilar and left infrahilar metastatic disease, better characterized on recent CT chest. Electronically Signed   By: Margaretha Sheffield MD   On: 02/01/2021 13:01   ECHOCARDIOGRAM COMPLETE  Result Date: 02/01/2021    ECHOCARDIOGRAM REPORT   Patient Name:   KALUP JAQUITH Beaudry Date of Exam: 02/01/2021 Medical Rec #:  [id number]       Height:       75.0 in Accession #:    [id number]      Weight:       191.0 lb Date of Birth:  [birth year]       BSA:          2.152 m Patient Age:    55 years        BP:           127/67 mmHg  Patient Gender: M               HR:           99 bpm. Exam Location:  Inpatient Procedure: 2D Echo, Cardiac Doppler and Color Doppler Indications:    Dyspnea R06.00  History:        Patient has prior history of Echocardiogram examinations, most                 recent 11/07/2015. Risk Factors:Diabetes. Cancer.  Sonographer:    Jonelle Sidle Dance Referring Phys: [id number] [place]  1. Left ventricular ejection fraction, by estimation, is 40 to 45%. The left ventricle has mild to moderately decreased function. The left ventricle demonstrates global hypokinesis. There is mild left ventricular hypertrophy. Left ventricular diastolic parameters are consistent with Grade I diastolic dysfunction  (impaired relaxation).  2. Right ventricular systolic function is normal. The right ventricular size is normal. Tricuspid regurgitation signal is inadequate for assessing PA pressure.  3. Mild diastolic collapse.  4. A small pericardial effusion is present. The pericardial effusion is circumferential. There is no evidence of cardiac tamponade. Large pleural effusion in both left and right lateral regions.  5. The mitral valve is grossly normal. Trivial mitral valve regurgitation.  6. The aortic valve is tricuspid. Aortic valve regurgitation is not visualized.  7. The inferior vena cava is dilated in size with <50% respiratory variability, suggesting right atrial pressure of 15 mmHg. FINDINGS  Left Ventricle: Left ventricular ejection fraction, by estimation, is 40 to 45%. The left ventricle has mild to moderately decreased function. The left ventricle demonstrates global hypokinesis. The left ventricular internal cavity size was normal in size. There is mild left ventricular hypertrophy. Left ventricular diastolic parameters are consistent with Grade I diastolic dysfunction (impaired relaxation). Indeterminate filling pressures. Right Ventricle: The right ventricular size is normal. No increase in right ventricular wall thickness. Right ventricular systolic function is normal. Tricuspid regurgitation signal is inadequate for assessing PA pressure. Left Atrium: Left atrial size was normal in size. Right Atrium: Mild diastolic collapse. Right atrial size was normal in size. Pericardium: A small pericardial effusion is present. The pericardial effusion is circumferential. There is diastolic collapse of the right atrial wall. There is no evidence of cardiac tamponade. Mitral Valve: The mitral valve is grossly normal. Trivial mitral valve regurgitation. Tricuspid Valve: The tricuspid valve is grossly normal. Tricuspid valve regurgitation is trivial. Aortic Valve: The aortic valve is tricuspid. Aortic valve regurgitation  is not visualized. Pulmonic Valve: The pulmonic valve was not well visualized. Pulmonic valve regurgitation is not visualized. Aorta: The aortic root and ascending aorta are structurally normal, with no evidence of dilitation. Venous: The inferior vena cava is dilated in size with less than 50% respiratory variability, suggesting right atrial pressure of 15 mmHg. IAS/Shunts: No atrial level shunt detected by color flow Doppler. Additional Comments: There is a large pleural effusion in both left and right lateral regions.  LEFT VENTRICLE PLAX 2D LVIDd:         5.00 cm  Diastology LVIDs:         4.44 cm  LV e' medial:    6.47 cm/s LV PW:         1.10 cm  LV E/e' medial:  12.0 LV IVS:        0.72 cm  LV e' lateral:   7.71 cm/s LVOT diam:     2.10 cm  LV E/e' lateral: 10.1 LV SV:  52 LV SV Index:   24 LVOT Area:     3.46 cm  RIGHT VENTRICLE             IVC RV Basal diam:  2.66 cm     IVC diam: 2.31 cm RV S prime:     15.90 cm/s TAPSE (M-mode): 2.1 cm LEFT ATRIUM         Index LA diam:    3.00 cm 1.39 cm/m  AORTIC VALVE LVOT Vmax:   99.30 cm/s LVOT Vmean:  64.500 cm/s LVOT VTI:    0.151 m  AORTA Ao Root diam: 3.70 cm Ao Asc diam:  3.40 cm MITRAL VALVE MV Area (PHT): 3.08 cm    SHUNTS MV Decel Time: 246 msec    Systemic VTI:  0.15 m MV E velocity: 77.70 cm/s  Systemic Diam: 2.10 cm MV A velocity: 98.00 cm/s MV E/A ratio:  0.79 Lyman Bishop MD Electronically signed by Lyman Bishop MD Signature Date/Time: 02/01/2021/12:52:38 PM    Final     Microbiology: Recent Results (from the past 240 hour(s))  Resp Panel by RT-PCR (Flu A&B, Covid) Nasopharyngeal Swab     Status: None   Collection Time: 02/15/21  1:45 PM   Specimen: Nasopharyngeal Swab; Nasopharyngeal(NP) swabs in vial transport medium  Result Value Ref Range Status   SARS Coronavirus 2 by RT PCR NEGATIVE NEGATIVE Final    Comment: (NOTE) SARS-CoV-2 target nucleic acids are NOT DETECTED.  The SARS-CoV-2 RNA is generally detectable in upper  respiratory specimens during the acute phase of infection. The lowest concentration of SARS-CoV-2 viral copies this assay can detect is 138 copies/mL. A negative result does not preclude SARS-Cov-2 infection and should not be used as the sole basis for treatment or other patient management decisions. A negative result may occur with  improper specimen collection/handling, submission of specimen other than nasopharyngeal swab, presence of viral mutation(s) within the areas targeted by this assay, and inadequate number of viral copies(<138 copies/mL). A negative result must be combined with clinical observations, patient history, and epidemiological information. The expected result is Negative.  Fact Sheet for Patients:  EntrepreneurPulse.com.au  Fact Sheet for Healthcare Providers:  IncredibleEmployment.be  This test is no t yet approved or cleared by the [place] FDA and  has been authorized for detection and/or diagnosis of SARS-CoV-2 by FDA under an Emergency Use Authorization (EUA). This EUA will remain  in effect (meaning this test can be used) for the duration of the COVID-19 declaration under Section 564(b)(1) of the Act, 21 U.S.C.section 360bbb-3(b)(1), unless the authorization is terminated  or revoked sooner.       Influenza A by PCR NEGATIVE NEGATIVE Final   Influenza B by PCR NEGATIVE NEGATIVE Final    Comment: (NOTE) The Xpert Xpress SARS-CoV-2/FLU/RSV plus assay is intended as an aid in the diagnosis of influenza from Nasopharyngeal swab specimens and should not be used as a sole basis for treatment. Nasal washings and aspirates are unacceptable for Xpert Xpress SARS-CoV-2/FLU/RSV testing.  Fact Sheet for Patients: EntrepreneurPulse.com.au  Fact Sheet for Healthcare Providers: IncredibleEmployment.be  This test is not yet approved or cleared by the [place] FDA and has been  authorized for detection and/or diagnosis of SARS-CoV-2 by FDA under an Emergency Use Authorization (EUA). This EUA will remain in effect (meaning this test can be used) for the duration of the COVID-19 declaration under Section 564(b)(1) of the Act, 21 U.S.C. section 360bbb-3(b)(1), unless the authorization is terminated or revoked.  Performed at  [facility], [place] [address], [place], [place] [address]      Labs: Basic Metabolic Panel: Recent Labs  Lab 02/15/21 1215 02/15/21 1600 02/15/21 1813 02/16/21 0333 02/16/21 0703 02/16/21 1452 02/16/21 1919 02/16/21 2349 02/17/21 0332 02/17/21 1005 02/17/21 1432  NA 138 138  --  138  --  136  --   --  138  --   --   K 7.4* 6.5*   < > 6.7*   < > 7.3*  7.3* 6.1* 5.4* 5.3* 5.2* 5.3*  CL 106 108  --  107  --  105  --   --  106  --   --   CO2 26 25  --  26  --  25  --   --  24  --   --   GLUCOSE 106* 62*  --  94  --  92  --   --  96  --   --   BUN 34* 31*  --  31*  --  27*  --   --  27*  --   --   CREATININE 1.95* 1.88*  --  1.84*  --  1.70*  --   --  1.81*  --   --   CALCIUM 9.0 11.0*  --  8.5*  --  8.5*  --   --  8.5*  --   --    < > = values in this interval not displayed.   Liver Function Tests: Recent Labs  Lab 02/15/21 1215  AST 23  ALT 36  ALKPHOS 102  BILITOT 0.4  PROT 6.9  ALBUMIN 2.6*   No results for input(s): LIPASE, AMYLASE in the last 168 hours. No results for input(s): AMMONIA in the last 168 hours. CBC: Recent Labs  Lab 02/15/21 1215 02/16/21 0333 02/17/21 0332 02/17/21 1005  WBC 7.4 6.7 7.3 6.2  NEUTROABS 5.5  --   --   --   HGB 8.5* 7.1* 6.9* 8.8*  HCT 29.8* 25.4* 23.7* 29.4*  MCV 104.9* 105.0* 102.2* 101.0*  PLT 309 263 289 352   Cardiac Enzymes: No results for input(s): CKTOTAL, CKMB, CKMBINDEX, TROPONINI in the last 168 hours. BNP: BNP (last 3 results) Recent Labs    01/31/21 0606  BNP 341.5*    ProBNP (last 3 results) No results for input(s): PROBNP in the last  8760 hours.  CBG: Recent Labs  Lab 02/15/21 1658 02/15/21 2120 02/15/21 2221 02/16/21 0534 02/16/21 0630  GLUCAP 78 129* 126* 84 98       Signed:  Alma Friendly, MD [facility] 02/17/2021, 3:19 PM

## 2021-02-18 ENCOUNTER — Telehealth: Payer: Self-pay

## 2021-02-18 ENCOUNTER — Other Ambulatory Visit: Payer: Self-pay | Admitting: Oncology

## 2021-02-18 ENCOUNTER — Other Ambulatory Visit: Payer: Self-pay

## 2021-02-18 ENCOUNTER — Inpatient Hospital Stay (HOSPITAL_BASED_OUTPATIENT_CLINIC_OR_DEPARTMENT_OTHER): Payer: Medicare Other | Admitting: Oncology

## 2021-02-18 ENCOUNTER — Inpatient Hospital Stay: Payer: Medicare Other

## 2021-02-18 ENCOUNTER — Telehealth: Payer: Self-pay | Admitting: Pharmacist

## 2021-02-18 VITALS — BP 152/94 | HR 92 | Temp 97.0°F | Resp 18 | Ht 76.0 in | Wt 171.0 lb

## 2021-02-18 DIAGNOSIS — Z79899 Other long term (current) drug therapy: Secondary | ICD-10-CM | POA: Diagnosis not present

## 2021-02-18 DIAGNOSIS — C649 Malignant neoplasm of unspecified kidney, except renal pelvis: Secondary | ICD-10-CM

## 2021-02-18 DIAGNOSIS — D500 Iron deficiency anemia secondary to blood loss (chronic): Secondary | ICD-10-CM | POA: Diagnosis not present

## 2021-02-18 DIAGNOSIS — D630 Anemia in neoplastic disease: Secondary | ICD-10-CM | POA: Diagnosis not present

## 2021-02-18 DIAGNOSIS — C7951 Secondary malignant neoplasm of bone: Secondary | ICD-10-CM | POA: Diagnosis not present

## 2021-02-18 DIAGNOSIS — E871 Hypo-osmolality and hyponatremia: Secondary | ICD-10-CM | POA: Diagnosis not present

## 2021-02-18 DIAGNOSIS — K922 Gastrointestinal hemorrhage, unspecified: Secondary | ICD-10-CM | POA: Diagnosis not present

## 2021-02-18 DIAGNOSIS — C7800 Secondary malignant neoplasm of unspecified lung: Secondary | ICD-10-CM | POA: Diagnosis not present

## 2021-02-18 DIAGNOSIS — D642 Secondary sideroblastic anemia due to drugs and toxins: Secondary | ICD-10-CM | POA: Diagnosis not present

## 2021-02-18 DIAGNOSIS — Z905 Acquired absence of kidney: Secondary | ICD-10-CM | POA: Diagnosis not present

## 2021-02-18 DIAGNOSIS — E875 Hyperkalemia: Secondary | ICD-10-CM | POA: Diagnosis not present

## 2021-02-18 DIAGNOSIS — I2699 Other pulmonary embolism without acute cor pulmonale: Secondary | ICD-10-CM | POA: Diagnosis not present

## 2021-02-18 DIAGNOSIS — K5520 Angiodysplasia of colon without hemorrhage: Secondary | ICD-10-CM | POA: Diagnosis not present

## 2021-02-18 DIAGNOSIS — R601 Generalized edema: Secondary | ICD-10-CM | POA: Diagnosis not present

## 2021-02-18 LAB — CBC WITH DIFFERENTIAL (CANCER CENTER ONLY)
Abs Immature Granulocytes: 0.01 10*3/uL (ref 0.00–0.07)
Basophils Absolute: 0 10*3/uL (ref 0.0–0.1)
Basophils Relative: 1 %
Eosinophils Absolute: 0.3 10*3/uL (ref 0.0–0.5)
Eosinophils Relative: 4 %
HCT: 33.3 % — ABNORMAL LOW (ref 39.0–52.0)
Hemoglobin: 9.7 g/dL — ABNORMAL LOW (ref 13.0–17.0)
Immature Granulocytes: 0 %
Lymphocytes Relative: 10 %
Lymphs Abs: 0.7 10*3/uL (ref 0.7–4.0)
MCH: 29.1 pg (ref 26.0–34.0)
MCHC: 29.1 g/dL — ABNORMAL LOW (ref 30.0–36.0)
MCV: 100 fL (ref 80.0–100.0)
Monocytes Absolute: 0.7 10*3/uL (ref 0.1–1.0)
Monocytes Relative: 10 %
Neutro Abs: 5.1 10*3/uL (ref 1.7–7.7)
Neutrophils Relative %: 75 %
Platelet Count: 262 10*3/uL (ref 150–400)
RBC: 3.33 MIL/uL — ABNORMAL LOW (ref 4.22–5.81)
RDW: 17.8 % — ABNORMAL HIGH (ref 11.5–15.5)
WBC Count: 6.8 10*3/uL (ref 4.0–10.5)
nRBC: 0 % (ref 0.0–0.2)

## 2021-02-18 LAB — TYPE AND SCREEN
ABO/RH(D): A NEG
Antibody Screen: NEGATIVE
Unit division: 0

## 2021-02-18 LAB — BPAM RBC
Blood Product Expiration Date: 202204032359
ISSUE DATE / TIME: 202202270616
Unit Type and Rh: 600

## 2021-02-18 LAB — CMP (CANCER CENTER ONLY)
ALT: 25 U/L (ref 0–44)
AST: 17 U/L (ref 15–41)
Albumin: 2.6 g/dL — ABNORMAL LOW (ref 3.5–5.0)
Alkaline Phosphatase: 109 U/L (ref 38–126)
Anion gap: 8 (ref 5–15)
BUN: 23 mg/dL — ABNORMAL HIGH (ref 6–20)
CO2: 24 mmol/L (ref 22–32)
Calcium: 9.2 mg/dL (ref 8.9–10.3)
Chloride: 106 mmol/L (ref 98–111)
Creatinine: 1.52 mg/dL — ABNORMAL HIGH (ref 0.61–1.24)
GFR, Estimated: 54 mL/min — ABNORMAL LOW (ref >60)
Glucose, Bld: 104 mg/dL — ABNORMAL HIGH (ref 70–99)
Potassium: 4.9 mmol/L (ref 3.5–5.1)
Sodium: 138 mmol/L (ref 135–145)
Total Bilirubin: 0.3 mg/dL (ref 0.3–1.2)
Total Protein: 6.7 g/dL (ref 6.5–8.1)

## 2021-02-18 MED ORDER — AXITINIB 5 MG PO TABS: 5.0000 mg | ORAL_TABLET | Freq: Two times a day (BID) | ORAL | 0 refills | Status: DC

## 2021-02-18 NOTE — Telephone Encounter (Signed)
Oral Oncology Pharmacist Encounter  Received new prescription for Inlyta (axitinib) for the treatment of metastatic clear cell renal cell carcinoma, planned duration until disease progression or unacceptable drug toxicity.  Prescription dose and frequency assessed for appropriateness. Appropriate for therapy initiation. Pending patient's tolerance to Inlyta - pembrolizumab may be added in future if tolerated well.   CBC w/ Diff and CMP from 02/18/21 assessed, no renal or hepatic dose adjustments required for patient at this time.  Current medication list in Epic reviewed, no relevant/significant DDIs with Inlyta identified.  Evaluated chart and no patient barriers to medication adherence noted.   Patient agreement for Inlyta treatment documented in MD note on 02/18/21.  Prescription has been e-scribed to the [facility] for benefits analysis and approval.  Oral Oncology Clinic will continue to follow for insurance authorization, copayment issues, initial counseling and start date.  Leron Croak, PharmD, BCPS Hematology/Oncology Clinical Pharmacist [facility] [phone] 02/18/2021 11:34 AM

## 2021-02-18 NOTE — Progress Notes (Signed)
Hematology and Oncology Follow Up   Keith [name] [id number] [birth year] 55 y.o. 02/18/2021 10:47 AM Keith [name], MDPharr, [name], MD       Principle Diagnosis: 55 year old man with  kidney cancer diagnosed in 2009.  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     Prior Therapy: 1. Status post laparoscopic radical nephrectomy.  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  2. Patient status post thoracotomy for a synchronous metastatic lung lesions done October 2009.   3. Patient is status post stereotactic radiotherapy to pulmonary nodules in May of 2010. 4. He is S/P Sutent 50 mg 4 weeks on 2 weeks off from 10/2010 to 03/2013. He progressed at that time.  5. He is S/P radiation to the right sacral bone between 4/22 to 4/30.  6. He is S/P XRT to the left shoulder 03/20/14 to 03/31/14. 7. Votrient 800 mg by mouth daily from 03/2013 through 06/22/2015. Discontinued secondary to disease progression. 8. Nivolumab 3 mg/kg given every 2 weeks started on 06/29/2015. He is status post 4 cycles completed 08/10/2015. He developed disease progression in September 2016.  9. Status post radiation therapy to the left mid fibula completed on 11/14/2015. He received a grade 1 fraction. 10. He is status post radiation to the proximal and distal femur over 2 weeks and 10 fractions of total of 30 Gy.  This was completed in March 2019. 11.   Radiation therapy to the right tibia.  Last treatment completed in September 2019. 12. Cabometyx 60 mg daily started in November 2016.  Therapy discontinued and February 2022.  Progression of disease.  Current therapy: Under consideration start salvage therapy.  Interim History: Keith [name] returns today for a follow-up evaluation.  Since the last visit, he was hospitalized on 2 separate occasions in February 2022 and was discharged on February 26 after presenting with hyperkalemia.  He had also GI  bleed and has been off Xarelto since that time.  He did require packed red cell transfusion as well.  Since his discharge, he does report symptoms of fatigue and tiredness and some dyspnea on exertion.  His performance status is improving slowly.  Denies any worsening bone pain or pathological fractures.  He reports his generalized edema has improved.    Medications: Reviewed today and updated. Current Outpatient Medications  Medication Sig Dispense Refill  . ALPRAZolam (XANAX) 1 MG tablet TAKE 1 TABLET BY MOUTH THREE TIMES A DAY AS NEEDED FOR ANXIETY 90 tablet 0  . carvedilol (COREG) 12.5 MG tablet Take 1 tablet (12.5 mg total) by mouth 2 (two) times daily with a meal. 60 tablet 0  . Cyanocobalamin (B-12) 1000 MCG TABS Take 1 tablet (1,000 mcg total) by mouth daily. (Patient not taking: No sig reported) 30 tablet   . diphenhydrAMINE (BENADRYL) 25 MG tablet Take 25 mg by mouth every 6 (six) hours as needed for allergies.    . diphenhydramine-acetaminophen (TYLENOL PM) 25-500 MG TABS tablet Take 1 tablet by mouth at bedtime as needed (pain).    . ferrous sulfate 325 (65 FE) MG tablet TAKE 1 TABLET BY MOUTH EVERY DAY WITH BREAKFAST (Patient taking differently: Take 325 mg by mouth daily.) 90 tablet 2  . furosemide (LASIX) 20 MG tablet TAKE 1 TABLET BY MOUTH EVERY DAY 30 tablet 1  . HYDROmorphone (DILAUDID) 8 MG tablet Take 1 tablet (8 mg total) by mouth every 4 (four) hours as needed for severe pain. (  Patient taking differently: Take 8 mg by mouth every 4 (four) hours as needed for moderate pain or severe pain.) 30 tablet 0  . Magnesium Oxide 400 MG CAPS Take 1 capsule (400 mg total) by mouth daily. (Patient not taking: No sig reported) 7 capsule 0  . ondansetron (ZOFRAN) 4 MG tablet Take one tablet every morning.  Can take one tablet later in the day on an as needed basis (Patient taking differently: Take 4 mg by mouth daily.) 50 tablet 11  . PAIN MANAGEMENT INTRATHECAL, IT, PUMP 1 each by  Intrathecal route continuous. Intrathecal (IT) medication:   Morphine.    . pantoprazole (PROTONIX) 40 MG tablet Take 1 tablet (40 mg total) by mouth daily. 30 tablet 3   No current facility-administered medications for this visit.     Allergies:  Allergies  Allergen Reactions  . Ceftriaxone Hives    Other reaction(s): red streaks  . Hydrocodone Swelling    Physical examination:  Blood pressure (!) 152/94, pulse 92, temperature (!) 97 F (36.1 C), temperature source Tympanic, resp. rate 18, height 6\' 4"  (1.93 m), weight 171 lb (77.6 kg), SpO2 93 %.    ECOG 1     General appearance: Comfortable appearing without any discomfort Head: Normocephalic without any trauma Oropharynx: Mucous membranes are moist and pink without any thrush or ulcers. Eyes: Pupils are equal and round reactive to light. Lymph nodes: No cervical, supraclavicular, inguinal or axillary lymphadenopathy.   Heart:regular rate and rhythm.  S1 and S2: Edema noted bilateral lower extremities as well as trace edema in his upper extremity as well. Lung: Clear without any rhonchi or wheezes.  No dullness to percussion. Abdomin: Soft, nontender, nondistended with good bowel sounds.  No hepatosplenomegaly. Musculoskeletal: No joint deformity or effusion.  Full range of motion noted. Neurological: No deficits noted on motor, sensory and deep tendon reflex exam. Skin: No petechial rash or dryness.  Appeared moist.                Lab Results: Lab Results  Component Value Date   WBC 6.8 02/18/2021   HGB 9.7 (L) 02/18/2021   HCT 33.3 (L) 02/18/2021   MCV 100.0 02/18/2021   PLT 262 02/18/2021     Chemistry      Component Value Date/Time   NA 138 02/17/2021 0332   NA 142 12/24/2017 1317   K 5.3 (H) 02/17/2021 1432   K 4.3 12/24/2017 1317   CL 106 02/17/2021 0332   CL 106 05/24/2013 1436   CO2 24 02/17/2021 0332   CO2 32 (H) 12/24/2017 1317   BUN 27 (H) 02/17/2021 0332   BUN 10.3 12/24/2017  1317   CREATININE 1.81 (H) 02/17/2021 0332   CREATININE 1.35 (H) 01/22/2021 0800   CREATININE 0.9 12/24/2017 1317   GLU 160 (H) 06/29/2015 0957      Component Value Date/Time   CALCIUM 8.5 (L) 02/17/2021 0332   CALCIUM 8.8 12/24/2017 1317   ALKPHOS 102 02/15/2021 1215   ALKPHOS 95 12/24/2017 1317   AST 23 02/15/2021 1215   AST 17 01/22/2021 0800   AST 20 12/24/2017 1317   ALT 36 02/15/2021 1215   ALT 12 01/22/2021 0800   ALT 25 12/24/2017 1317   BILITOT 0.4 02/15/2021 1215   BILITOT 0.3 01/22/2021 0800   BILITOT 0.25 12/24/2017 1317      IMPRESSION: 1. Mild enlargement of the left lower lobe perihilar mass, and of the right middle lobe/right upper lobe peribronchovascular mass. Increased left  pleural effusion, now moderate to large. Stable small right pleural effusion. Stable small pericardial effusion. 2. Diffuse subcutaneous, mesenteric, and mediastinal edema compatible with third spacing of fluid. There is some hazy density in the lungs which also may be due to noncardiogenic edema. 3. Stable appearance of multiple pancreatic masses. 4. Stable osseous manifestations of metastatic disease, with the largest lesion in the right sacrum. 5. Emphysema and aortic atherosclerosis.  Impression and Plan:   55 year old man with  1.    Kidney cancer diagnosed in 2009.  He subsequently developed stage IV clear-cell renal cell carcinoma with pulmonary involvement.   CT scan obtained on January 31, 2021 was personally reviewed and discussed with the patient again.  He has progression of disease at this time as reasonable to switch to different salvage therapy.  Treatment options include immunotherapy single agent, combination immunotherapy or single agent oral targeted therapy alone or in combination with immunotherapy.  After discussion today, given his other comorbid conditions I recommended proceeding with axitinib at 5 mg twice a day and consider therapy escalation with  adding Pembrolizumab if he tolerates it well in the future.  Complication associated with his treatment include hypertension, fatigue, diarrhea among others.  He is agreeable to proceed.  2.  Pulmonary embolism: Given his recurrent GI bleeding I recommended discontinuation permanently of Xarelto.  3.  Anemia: Hemoglobin is adequate at this time does not require any further transfusion.  Hemoglobin of 9.7.  4.    Generalized anasarca: Appears to be multifactorial in nature related to potentially heart failure as well as poor nutritional intake.  5.  Prognosis: Any treatment is palliative at this time.  He is experiencing or decline in his quality of life and performance status prognosis remains guarded.  6.  Hyperkalemia and hyponatremia: Electrolytes obtained from today are within normal range.   7.  Follow-up: In 4 weeks for a follow-up visit.   30  minutes were dedicated to this visit.  The time was spent on updating disease status, discussing treatment options and future plan of care review.  Zola Button, MD 02/18/2021 10:47 AM

## 2021-02-18 NOTE — Telephone Encounter (Signed)
Oral Oncology Patient Advocate Encounter  Received notification from [place] that prior authorization for Inlyta is required.  PA submitted on CoverMyMeds Key [id number] Status is pending  Oral Oncology Clinic will continue to follow.   [place] Patient [place] Phone [phone] Fax [phone] 02/18/2021 11:46 AM

## 2021-02-18 NOTE — Telephone Encounter (Signed)
Oral Oncology Patient Advocate Encounter  Prior Authorization for [employer] has been approved.    PA# [id number] Effective dates: 02/18/21 through 12/21/21  Patients co-pay is $949  Oral Oncology Clinic will continue to follow.   [place] Patient [place] Phone [phone] Fax [phone] 02/18/2021 12:00 PM

## 2021-02-19 ENCOUNTER — Telehealth: Payer: Self-pay | Admitting: Oncology

## 2021-02-19 NOTE — Telephone Encounter (Signed)
Scheduled appointment per 02/28 los. Patient is aware.

## 2021-02-20 ENCOUNTER — Other Ambulatory Visit: Payer: Self-pay | Admitting: *Deleted

## 2021-02-20 ENCOUNTER — Other Ambulatory Visit: Payer: Self-pay

## 2021-02-20 NOTE — Patient Outreach (Signed)
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  Outreach attempt #2 successful to [phone] listed as primary outreach number in [name] answered and reports pt awaiting a call at [phone] Discussed with her that [phone] listed as primary and obtained permission to switch primary number to [phone]  Reviewed purpose of outreach for Care coordination and disease management He shared his primary medical concerns related to low sodium, edema/ fluid around heart & lung from sodium plus Potassium elevated  01/24/21 Keith [name] seen  Goals eating better, stay out of hospital,  meal planning, medications management, adjusting to restart of chemo   Oncology He discussed 2009 dx of cancer, removal of left kidney and right lungs, now mets in bones, liver, pancreas, lungs Reports he noted a medical change when he was taken offer  chemo medicine to get ready for a procedure but developed GI bleed  Seen by [name] at [facility]   congestive Heart Failure (CHF)  has a scale Weighs daily Keith [name] cardiology Reviewed CHF action plan & zones and when to call Dr Audie Box office to manage Lasix  learning Learn better  on hand training like to be shown Per mail vs online Visual pt   Medicines Has a grant for oncology medicines get $835/month on disability   Nutrition Discussed making good food choices/meal planning Need to learn how to read food labels Now eating healthy  Drink ensure drink 1 am 2 eggs toast oatmeal Dinner apple grapes  He goggles  Like sweet tea 16 oz then 12 oz  balance meals  Plan: Patient agrees to care plan and follow up within the next 14-21 business days  Goals      Patient Stated   .  [facility]) Track and Manage Fluids and Swelling-Heart Failure (pt-stated)      Timeframe:  Short-Term Goal Priority:  High Start Date:                02/13/21             Expected End Date:  03/21/21                     Follow Up Date 03/06/21   - use salt in moderation - watch for swelling in feet, ankles and legs every day     Notes: 02/20/21 decrease swelling, weighing, decreasing sodium 02/13/21 noted with increased swelling        Joelene Millin L. Lavina Hamman, RN, BSN, [place] Coordinator Office number [phone] [facility] number [phone] Fax number [phone]

## 2021-02-21 MED FILL — INLYTA 5 MG TABLET: 5 | 30 days supply | Qty: 60 | Fill #0

## 2021-02-21 NOTE — Telephone Encounter (Signed)
Oral Chemotherapy Pharmacist Encounter  I spoke with patient's wife, Keith [name], for overview of: Inlyta (axitinib) for the treatment of previously treated, metastatic renal cell carcinoma, planned duration until disease progression or unacceptable toxicity.   Counseled on administration, dosing, side effects, monitoring, drug-food interactions, safe handling, storage, and disposal.  Patient will take Inlyta 5mg  tablets, 1 tablet by mouth twice daily, without regard to food, with a glass of water.  Instructed to avoid grapefruit and grapefruit juice while on therapy with Inlyta.  Inlyta start date: 02/22/21  Adverse effects include but are not limited to: hypertension, hand-foot syndrome, nausea, vomiting, diarrhea, fatigue, dysphonia (hoarseness), and abnormal laboratory values.    Patient has anti diarrheal on hand and will alert the office of 4 or more loose stools above baseline.  Inlyta will be held at least 24 hours prior to surgery and resumed at discretion of treating physician based on wound healing  Reviewed importance of keeping a medication schedule and plan for any missed doses. No barriers to medication adherence identified.  Medication reconciliation performed and medication/allergy list updated.  Insurance authorization for Keith [name] has been obtained. Test claim at the pharmacy revealed copayment [phone] for 1st fill of Inlyta. Patient has grant that brings his out of pocket cost to $0. Patient's wife will pick this up from the [facility] on 02/21/21.  Patient's wife informed the pharmacy will reach out 5-7 days prior to needing next fill of Inlyta to coordinate continued medication acquisition to prevent break in therapy.  All questions answered.  Keith [name] voiced understanding and appreciation.   Medication education handout placed in mail for patient. Patient and wife know to call the office with questions or concerns. Oral Chemotherapy Clinic phone  number provided to patient's wife.   Leron Croak, PharmD, BCPS Hematology/Oncology Clinical Pharmacist [facility] [phone] 02/21/2021 2:20 PM

## 2021-02-21 NOTE — Telephone Encounter (Signed)
Oral Chemotherapy Pharmacist Encounter   Attempted to reach patient to provide update and offer for initial counseling on oral medication: Inlyta (axitinib).   No answer. Left voicemail for patient to call back to discuss details of medication acquisition and initial counseling session.  Leron Croak, PharmD, BCPS Hematology/Oncology Clinical Pharmacist [facility] [phone] 02/21/2021 1:42 PM

## 2021-02-24 NOTE — Progress Notes (Signed)
Cardiology Office Note:   Date:  02/27/2021  NAME:  Keith [name]    MRN: [id number] DOB:  Nov 22, 1966   PCP:  [name], MD  Cardiologist:  Evalina Field, MD   Referring MD: [name], MD   Chief Complaint  Patient presents with  . Congestive Heart Failure        History of Present Illness:   Keith [name] is a 55 y.o. male with a hx of systolic HF, 09-23%, stage IV renal CA, recurrent GI bleed/anemia, CKD III who presents for follow-up. Recent admission for volume overload 2/2 systolic HF and hypoalbuminemia. LHC deferred due to recurrent GI bleed and inability to tolerate anticoagulation.  He reports he is doing fairly well.  He was readmitted to the hospital with hyperkalemia.  This was in the setting of over medication with potassium.  He was also on losartan Aldactone.  These have since been stopped.  Likely not good choices given his CKD.  I recommended hydralazine.  His weight appears to be doing quite well.  He denies any significant chest pains.  He reports he can get short of breath with exertion.  Anemia is still a problem.  He is also had issues with blood pressure.  He was started on axitinib.  This medication is known to cause high blood pressure.  BP 140/90.  I recommended added hydralazine in addition to his Coreg.  Volume status is acceptable today.  He reports when his blood pressure is high can have chest pain or shortness of breath.  This is not unexpected.  We do need to keep a close eye on this.  He has stage IV metastatic renal cancer.  His overall condition is treatable but not curable.  He understands this.  He seems to be doing well since leaving the hospital the second time.  We also discussed with he and his wife that we cannot pursue left heart catheterization given his anemia and history of bleeding.  He cannot be on anticoagulation due to recurrent GI bleed.  It is contraindicated per GI.   Problem List 1. Stage IV Renal Cancer 2. PE 3. Recurrent GI  Bleed/Aneima -recurrent transfusions  4. Diabetes 5. Systolic HF -EF 30-07% 6. CKD III 7. Anasarca/Hypoalbuminemia   Past Medical History: Past Medical History:  Diagnosis Date  . Diabetes mellitus without complication ([place])    diet controlled only  . GI bleed   . left renal ca d'd 2008  . 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 Metastasis to bone [facility]) 2014/2016    Past Surgical History: Past Surgical History:  Procedure Laterality Date  . BIOPSY  08/28/2019   Procedure: BIOPSY;  Surgeon: Thornton Park, MD;  Location: [facility];  Service: Gastroenterology;;  . BIOPSY  03/01/2020   Procedure: BIOPSY;  Surgeon: Mauri Pole, MD;  Location: [facility];  Service: Endoscopy;;  . BIOPSY  12/19/2020   Procedure: BIOPSY;  Surgeon: Yetta Flock, MD;  [facility]: [facility];  Service: Gastroenterology;;  . BIOPSY  12/20/2020   Procedure: BIOPSY;  Surgeon: Gatha Mayer, MD;  Location: [facility];  Service: Endoscopy;;  . COLONOSCOPY W/ POLYPECTOMY    . COLONOSCOPY WITH PROPOFOL N/A 03/01/2020   Procedure: COLONOSCOPY WITH PROPOFOL;  Surgeon: Mauri Pole, MD;  [facility];  Service: Endoscopy;  Laterality: N/A;  . COLONOSCOPY WITH PROPOFOL N/A 12/20/2020   Procedure: COLONOSCOPY WITH PROPOFOL;  Surgeon: Gatha Mayer,  MD;  [facility];  Service: Endoscopy;  Laterality: N/A;  . ESOPHAGOGASTRODUODENOSCOPY (EGD) WITH PROPOFOL N/A 08/28/2019   Procedure: ESOPHAGOGASTRODUODENOSCOPY (EGD) WITH PROPOFOL;  Surgeon: Thornton Park, MD;  [facility];  Service: Gastroenterology;  Laterality: N/A;  . ESOPHAGOGASTRODUODENOSCOPY (EGD) WITH PROPOFOL N/A 03/01/2020   Procedure: ESOPHAGOGASTRODUODENOSCOPY (EGD) WITH PROPOFOL;  Surgeon: Mauri Pole, MD;  [facility];  Service: Endoscopy;  Laterality: N/A;  . ESOPHAGOGASTRODUODENOSCOPY (EGD) WITH PROPOFOL N/A 12/19/2020   Procedure:  ESOPHAGOGASTRODUODENOSCOPY (EGD) WITH PROPOFOL;  Surgeon: Yetta Flock, MD;  [facility];  Service: Gastroenterology;  Laterality: N/A;  . FEMUR IM NAIL Right 02/16/2018   Procedure: INTRAMEDULLARY (IM) NAIL RIGHT FEMORAL;  Surgeon: Paralee Cancel, MD;  Location: [facility];  Service: Orthopedics;  Laterality: Right;  . GIVENS CAPSULE STUDY N/A 03/03/2020   Procedure: GIVENS CAPSULE STUDY;  Surgeon: Mauri Pole, MD;  [facility];  Service: Endoscopy;  Laterality: N/A;  . HAND SURGERY Right   . HEMOSTASIS CLIP PLACEMENT  12/20/2020   Procedure: HEMOSTASIS CLIP PLACEMENT;  Surgeon: Gatha Mayer, MD;  [facility];  Service: Endoscopy;;  . HOT HEMOSTASIS N/A 12/20/2020   Procedure: HOT HEMOSTASIS (ARGON PLASMA COAGULATION/BICAP);  Surgeon: Gatha Mayer, MD;  Location: [facility];  Service: Endoscopy;  Laterality: N/A;  . IR ANGIOGRAM EXTREMITY RIGHT  02/15/2018  . IR ANGIOGRAM SELECTIVE EACH ADDITIONAL VESSEL  02/15/2018  . IR ANGIOGRAM SELECTIVE EACH ADDITIONAL VESSEL  02/15/2018  . IR ANGIOGRAM SELECTIVE EACH ADDITIONAL VESSEL  02/15/2018  . IR ANGIOGRAM SELECTIVE EACH ADDITIONAL VESSEL  02/15/2018  . IR EMBO TUMOR ORGAN ISCHEMIA INFARCT INC GUIDE ROADMAPPING  02/15/2018  . IR EMBO TUMOR ORGAN ISCHEMIA INFARCT INC GUIDE ROADMAPPING  02/15/2018  . IR US GUIDE VASC ACCESS LEFT  02/15/2018  . LUNG REMOVAL, PARTIAL Right 09/2008  . NEPHRECTOMY RADICAL     Left   . PAIN PUMP IMPLANTATION N/A 05/16/2015   Procedure: Intrathecal pain pump placement;  Surgeon: Clydell Hakim, MD;  Location: [place];  Service: Neurosurgery;  Laterality: N/A;  Intrathecal pain pump placement    Current Medications: Current Meds  Medication Sig  . ALPRAZolam (XANAX) 1 MG tablet TAKE 1 TABLET BY MOUTH THREE TIMES A DAY AS NEEDED FOR ANXIETY  . axitinib (INLYTA) 5 MG tablet Take 1 tablet (5 mg total) by mouth 2 (two) times daily.  . Cyanocobalamin (B-12) 1000 MCG TABS Take 1  tablet (1,000 mcg total) by mouth daily.  . diphenhydrAMINE (BENADRYL) 25 MG tablet Take 25 mg by mouth every 6 (six) hours as needed for allergies.  . diphenhydramine-acetaminophen (TYLENOL PM) 25-500 MG TABS tablet Take 1 tablet by mouth at bedtime as needed (pain).  . ferrous sulfate 325 (65 FE) MG tablet TAKE 1 TABLET BY MOUTH EVERY DAY WITH BREAKFAST (Patient taking differently: Take 325 mg by mouth daily.)  . hydrALAZINE (APRESOLINE) 25 MG tablet Take 1 tablet (25 mg total) by mouth 3 (three) times daily.  [name] HYDROmorphone (DILAUDID) 8 MG tablet Take 1 tablet (8 mg total) by mouth every 4 (four) hours as needed for severe pain. (Patient taking differently: Take 8 mg by mouth every 4 (four) hours as needed for moderate pain or severe pain.)  . magic mouthwash SOLN Take 5 mLs by mouth in the morning, at noon, in the evening, and at bedtime. Swish and Spit.  . Magnesium Oxide 400 MG CAPS Take 1 capsule (400 mg total) by mouth daily.  . ondansetron (ZOFRAN)  4 MG tablet Take one tablet every morning.  Can take one tablet later in the day on an as needed basis (Patient taking differently: Take 4 mg by mouth daily.)  . PAIN MANAGEMENT INTRATHECAL, IT, PUMP 1 each by Intrathecal route continuous. Intrathecal (IT) medication:   Morphine.  . pantoprazole (PROTONIX) 40 MG tablet Take 1 tablet (40 mg total) by mouth daily.  . [DISCONTINUED] carvedilol (COREG) 12.5 MG tablet Take 1 tablet (12.5 mg total) by mouth 2 (two) times daily with a meal.  . [DISCONTINUED] furosemide (LASIX) 20 MG tablet TAKE 1 TABLET BY MOUTH EVERY DAY     Allergies:    Ceftriaxone and Hydrocodone   Social History: Social History   Socioeconomic History  . Marital status: Married    Spouse name: [name]  . Number of children: 2  . Years of education: Not on file  . Highest education level: Not on file  Occupational History  . Occupation: Clinical biochemist   Tobacco Use  . Smoking status: Current Every Day Smoker    Packs/day:  0.50    Years: 30.00    Pack years: 15.00    Types: Cigarettes  . Smokeless tobacco: Never Used  Vaping Use  . Vaping Use: Never used  Substance and Sexual Activity  . Alcohol use: No  . Drug use: Yes    Types: Marijuana    Comment: occasional. Last used: last night.   . Sexual activity: Not Currently  Other Topics Concern  . Not on file  Social History Narrative   6 caffeine drinks daily    08-11-18  Unable to ask abuse questions wife with him today.   Social Determinants of Health   Financial Resource Strain: Not on file  Food Insecurity: No Food Insecurity  . Worried About Charity fundraiser in the Last Year: Never true  . Ran Out of Food in the Last Year: Never true  Transportation Needs: No Transportation Needs  . Lack of Transportation (Medical): No  . Lack of Transportation (Non-Medical): No  Physical Activity: Not on file  Stress: Not on file  Social Connections: Not on file     Family History: The patient's family history includes Cancer in his maternal aunt; Diabetes in his brother; Hypertension in his mother; Irritable bowel syndrome in his sister. There is no history of Colon cancer.  ROS:   All other ROS reviewed and negative. Pertinent positives noted in the HPI.     EKGs/Labs/Other Studies Reviewed:   The following studies were personally reviewed by me today:  TTE 02/01/2021 1. Left ventricular ejection fraction, by estimation, is 40 to 45%. The  left ventricle has mild to moderately decreased function. The left  ventricle demonstrates global hypokinesis. There is mild left ventricular  hypertrophy. Left ventricular diastolic  parameters are consistent with Grade I diastolic dysfunction (impaired  relaxation).  2. Right ventricular systolic function is normal. The right ventricular  size is normal. Tricuspid regurgitation signal is inadequate for assessing  PA pressure.  3. Mild diastolic collapse.  4. A small pericardial effusion is present.  The pericardial effusion is  circumferential. There is no evidence of cardiac tamponade. Large pleural  effusion in both left and right lateral regions.  5. The mitral valve is grossly normal. Trivial mitral valve  regurgitation.  6. The aortic valve is tricuspid. Aortic valve regurgitation is not  visualized.  7. The inferior vena cava is dilated in size with <50% respiratory  variability, suggesting right atrial pressure of  15 mmHg.   Recent Labs: 01/31/2021: B Natriuretic Peptide 341.5 02/02/2021: TSH 8.890 02/06/2021: Magnesium 1.8 02/18/2021: ALT 25; BUN 23; Creatinine 1.52; Hemoglobin 9.7; Platelet Count 262; Potassium 4.9; Sodium 138   Recent Lipid Panel    Component Value Date/Time   TRIG 250 (H) 03/26/2011 0422    Physical Exam:   VS:  BP 140/90   Pulse 88   Ht 6' 4"  (1.93 m)   Wt 165 lb 12.8 oz (75.2 kg)   SpO2 97%   BMI 20.18 kg/m    Wt Readings from Last 3 Encounters:  02/27/21 165 lb 12.8 oz (75.2 kg)  02/18/21 171 lb (77.6 kg)  02/17/21 162 lb 4.1 oz (73.6 kg)    General: Well nourished, well developed, in no acute distress Head: Atraumatic, normal size  Eyes: PEERLA, EOMI  Neck: Supple, no JVD Endocrine: No thryomegaly Cardiac: Normal S1, S2; RRR; no murmurs, rubs, or gallops Lungs: Clear to auscultation bilaterally, no wheezing, rhonchi or rales  Abd: Soft, nontender, no hepatomegaly  Ext: Trace lower extremity edema Musculoskeletal: No deformities, BUE and BLE strength normal and equal Skin: Warm and dry, no rashes   Neuro: Alert and oriented to person, place, time, and situation, CNII-XII grossly intact, no focal deficits  Psych: Normal mood and affect   ASSESSMENT:   Keith [name] is a 55 y.o. male who presents for the following: 1. Chronic systolic heart failure ([place])   2. Anasarca   3. Primary hypertension     PLAN:   1. Chronic systolic heart failure ([place]) 2. Anasarca -Recent admission to the hospital for volume overload.  EF 40-45%.   Volume overload also attributable to anasarca with hypoalbuminemia.  He has stage IV metastatic renal cell cancer.  He has recurrent anemia secondary to his cancer and GI bleed.  He was on Xarelto for pulmonary embolism but due to recurrent GI bleeds this was all stopped.  He cannot be on any anticoagulation or antiantiplatelet agents.  I discussed this with him in the office that we cannot pursue aggressive work-up for his cardiomyopathy.  I suspect this could be related to blood pressure in the setting of his chemotherapeutic agents.  He also could have underlying CAD.  However he cannot be on any anticoagulation or antiantiplatelet agents.  There is no need for left heart catheterization.  He reports no symptoms concerning for angina.  We will continue with medical management. -His losartan and Aldactone were stopped due to hyperkalemia.  We will put him on hydralazine 50 mg 3 times daily.  Given his CKD stage III likely not good options for him to be on aggressive ARB and MRA. -He will continue Lasix 20 mg daily.  We will add extra dose as needed. -We will plan to add Imdur at his next visit. -He was counseled on salt reduction strategies.  This will help maintain the fluid balance.  3. Primary hypertension -Blood pressure still elevated related to chemotherapeutic agent.  Add hydralazine 50 mg 3 times daily as above.  Disposition: Return in about 3 months (around 05/30/2021).  Medication Adjustments/Labs and Tests Ordered: Current medicines are reviewed at length with the patient today.  Concerns regarding medicines are outlined above.  Orders Placed This Encounter  Procedures  . Basic metabolic panel  . CBC   Meds ordered this encounter  Medications  . hydrALAZINE (APRESOLINE) 25 MG tablet    Sig: Take 1 tablet (25 mg total) by mouth 3 (three) times daily.  Dispense:  270 tablet    Refill:  3  . carvedilol (COREG) 12.5 MG tablet    Sig: Take 1 tablet (12.5 mg total) by mouth 2 (two)  times daily with a meal.    Dispense:  60 tablet    Refill:  2  . furosemide (LASIX) 20 MG tablet    Sig: Take 1 tablet (20 mg total) by mouth daily.    Dispense:  30 tablet    Refill:  1    Patient Instructions  Medication Instructions:  Start Hydralazine 50 mg three times daily   *If you need a refill on your cardiac medications before your next appointment, please call your pharmacy*   Lab Work: BMET, CBC today   If you have labs (blood work) drawn today and your tests are completely normal, you will receive your results only by: [name] MyChart Message (if you have MyChart) OR . A paper copy in the mail If you have any lab test that is abnormal or we need to change your treatment, we will call you to review the results.  Follow-Up: At [facility], you and your health needs are our priority.  As part of our continuing mission to provide you with exceptional heart care, we have created designated Provider Care Teams.  These Care Teams include your primary Cardiologist (physician) and Advanced Practice Providers (APPs -  Physician Assistants and Nurse Practitioners) who all work together to provide you with the care you need, when you need it.  We recommend signing up for the patient portal called "MyChart".  Sign up information is provided on this After Visit Summary.  MyChart is used to connect with patients for Virtual Visits (Telemedicine).  Patients are able to view lab/test results, encounter notes, upcoming appointments, etc.  Non-urgent messages can be sent to your provider as well.   To learn more about what you can do with MyChart, go to [email or web address].    Your next appointment:   3 month(s)  The format for your next appointment:   In Person  Provider:   [name], MD        Time Spent with Patient: I have spent a total of 25 minutes with patient reviewing hospital notes, telemetry, EKGs, labs and examining the patient as well as establishing an  assessment and plan that was discussed with the patient.  > 50% of time was spent in direct patient care.  Signed, Addison Naegeli. Audie Box, MD, [place]  [address] [place], [place] [address] [phone]  02/27/2021 10:48 AM

## 2021-02-26 ENCOUNTER — Other Ambulatory Visit: Payer: Self-pay

## 2021-02-26 ENCOUNTER — Telehealth: Payer: Self-pay

## 2021-02-26 DIAGNOSIS — K1379 Other lesions of oral mucosa: Secondary | ICD-10-CM

## 2021-02-26 MED ORDER — MAGIC MOUTHWASH: 5.0000 mL | Freq: Four times a day (QID) | ORAL | 0 refills | Status: DC

## 2021-02-26 NOTE — Telephone Encounter (Signed)
-----   Message from [name], MD sent at 02/26/2021 11:29 AM EST ----- Regarding: RE: Mouth Sores Please order Magic mouthwash for him.  Thanks ----- Message ----- From: Georgianne Fick, RN Sent: 02/26/2021  10:58 AM EST To: [name], MD Subject: Mouth Sores                                    Patient called complaining of mouth sores and that you had mentioned that he can be ordered a mouth wash to help with this.

## 2021-02-26 NOTE — Telephone Encounter (Signed)
Spoke with patient regarding new order for magic mouth wash for his mouth sores. Instructions provided on use and he verbalized understanding.

## 2021-02-26 NOTE — Telephone Encounter (Signed)
Magic mouth wash ordered to [facility]. LVM for patient with this information.

## 2021-02-27 ENCOUNTER — Other Ambulatory Visit: Payer: Self-pay

## 2021-02-27 ENCOUNTER — Encounter: Payer: Self-pay | Admitting: Cardiovascular Disease

## 2021-02-27 ENCOUNTER — Telehealth: Payer: Self-pay

## 2021-02-27 ENCOUNTER — Ambulatory Visit: Payer: Medicare Other | Admitting: Cardiovascular Disease

## 2021-02-27 ENCOUNTER — Encounter (HOSPITAL_COMMUNITY): Payer: Self-pay

## 2021-02-27 VITALS — BP 140/90 | HR 88 | Ht 76.0 in | Wt 165.8 lb

## 2021-02-27 DIAGNOSIS — E875 Hyperkalemia: Secondary | ICD-10-CM | POA: Diagnosis not present

## 2021-02-27 DIAGNOSIS — Z85528 Personal history of other malignant neoplasm of kidney: Secondary | ICD-10-CM | POA: Insufficient documentation

## 2021-02-27 DIAGNOSIS — R601 Generalized edema: Secondary | ICD-10-CM

## 2021-02-27 DIAGNOSIS — F17210 Nicotine dependence, cigarettes, uncomplicated: Secondary | ICD-10-CM | POA: Insufficient documentation

## 2021-02-27 DIAGNOSIS — Z85118 Personal history of other malignant neoplasm of bronchus and lung: Secondary | ICD-10-CM | POA: Insufficient documentation

## 2021-02-27 DIAGNOSIS — Z79899 Other long term (current) drug therapy: Secondary | ICD-10-CM | POA: Diagnosis not present

## 2021-02-27 DIAGNOSIS — R609 Edema, unspecified: Secondary | ICD-10-CM | POA: Insufficient documentation

## 2021-02-27 DIAGNOSIS — E119 Type 2 diabetes mellitus without complications: Secondary | ICD-10-CM | POA: Diagnosis not present

## 2021-02-27 DIAGNOSIS — I5022 Chronic systolic (congestive) heart failure: Secondary | ICD-10-CM | POA: Diagnosis not present

## 2021-02-27 DIAGNOSIS — I10 Essential (primary) hypertension: Secondary | ICD-10-CM

## 2021-02-27 DIAGNOSIS — I110 Hypertensive heart disease with heart failure: Secondary | ICD-10-CM | POA: Diagnosis not present

## 2021-02-27 DIAGNOSIS — I5042 Chronic combined systolic (congestive) and diastolic (congestive) heart failure: Secondary | ICD-10-CM | POA: Diagnosis not present

## 2021-02-27 DIAGNOSIS — Z8507 Personal history of malignant neoplasm of pancreas: Secondary | ICD-10-CM | POA: Insufficient documentation

## 2021-02-27 LAB — BASIC METABOLIC PANEL
BUN/Creatinine Ratio: 19 (ref 9–20)
BUN: 28 mg/dL — ABNORMAL HIGH (ref 6–24)
CO2: 23 mmol/L (ref 20–29)
Calcium: 8.9 mg/dL (ref 8.7–10.2)
Chloride: 104 mmol/L (ref 96–106)
Creatinine, Ser: 1.46 mg/dL — ABNORMAL HIGH (ref 0.76–1.27)
Glucose: 94 mg/dL (ref 65–99)
Potassium: 6.5 mmol/L (ref 3.5–5.2)
Sodium: 138 mmol/L (ref 134–144)
eGFR: 57 mL/min/{1.73_m2} — ABNORMAL LOW (ref >59)

## 2021-02-27 LAB — COMPREHENSIVE METABOLIC PANEL
ALT: 18 U/L (ref 0–44)
AST: 17 U/L (ref 15–41)
Albumin: 3.2 g/dL — ABNORMAL LOW (ref 3.5–5.0)
Alkaline Phosphatase: 98 U/L (ref 38–126)
Anion gap: 7 (ref 5–15)
BUN: 32 mg/dL — ABNORMAL HIGH (ref 6–20)
CO2: 28 mmol/L (ref 22–32)
Calcium: 9 mg/dL (ref 8.9–10.3)
Chloride: 104 mmol/L (ref 98–111)
Creatinine, Ser: 1.62 mg/dL — ABNORMAL HIGH (ref 0.61–1.24)
GFR, Estimated: 50 mL/min — ABNORMAL LOW (ref >60)
Glucose, Bld: 144 mg/dL — ABNORMAL HIGH (ref 70–99)
Potassium: 5.5 mmol/L — ABNORMAL HIGH (ref 3.5–5.1)
Sodium: 139 mmol/L (ref 135–145)
Total Bilirubin: 0.4 mg/dL (ref 0.3–1.2)
Total Protein: 7.7 g/dL (ref 6.5–8.1)

## 2021-02-27 LAB — CBC
HCT: 34.3 % — ABNORMAL LOW (ref 39.0–52.0)
Hematocrit: 33.7 % — ABNORMAL LOW (ref 37.5–51.0)
Hemoglobin: 10.5 g/dL — ABNORMAL LOW (ref 13.0–17.7)
Hemoglobin: 9.9 g/dL — ABNORMAL LOW (ref 13.0–17.0)
MCH: 29.7 pg (ref 26.6–33.0)
MCH: 29.9 pg (ref 26.0–34.0)
MCHC: 31.2 g/dL — ABNORMAL LOW (ref 31.5–35.7)
MCHC: 28.9 g/dL — ABNORMAL LOW (ref 30.0–36.0)
MCV: 95 fL (ref 79–97)
MCV: 103.6 fL — ABNORMAL HIGH (ref 80.0–100.0)
Platelets: 286 10*3/uL (ref 150–450)
Platelets: 238 10*3/uL (ref 150–400)
RBC: 3.54 x10E6/uL — ABNORMAL LOW (ref 4.14–5.80)
RBC: 3.31 MIL/uL — ABNORMAL LOW (ref 4.22–5.81)
RDW: 15.9 % — ABNORMAL HIGH (ref 11.6–15.4)
RDW: 17.6 % — ABNORMAL HIGH (ref 11.5–15.5)
WBC: 7.1 10*3/uL (ref 3.4–10.8)
WBC: 8 10*3/uL (ref 4.0–10.5)
nRBC: 0 % (ref 0.0–0.2)

## 2021-02-27 MED ORDER — CARVEDILOL 12.5 MG PO TABS: 12.5000 mg | ORAL_TABLET | Freq: Two times a day (BID) | ORAL | 2 refills | Status: DC

## 2021-02-27 MED ORDER — FUROSEMIDE 20 MG PO TABS: 20.0000 mg | ORAL_TABLET | Freq: Every day | ORAL | 1 refills | Status: DC

## 2021-02-27 MED ORDER — HYDRALAZINE HCL 25 MG PO TABS: 25.0000 mg | ORAL_TABLET | Freq: Three times a day (TID) | ORAL | 3 refills | Status: DC

## 2021-02-27 NOTE — Telephone Encounter (Signed)
Called patient, after receiving critical lab of potassium 6.5-  Advised with Dr.[name] who suggested to send patient to ED.  Patient was advised of this, will go to ED for evaluation.  Will route to MD to make aware.  Thanks!

## 2021-02-27 NOTE — Patient Instructions (Signed)
Medication Instructions:  Start Hydralazine 50 mg three times daily   *If you need a refill on your cardiac medications before your next appointment, please call your pharmacy*   Lab Work: BMET, CBC today   If you have labs (blood work) drawn today and your tests are completely normal, you will receive your results only by: [name] MyChart Message (if you have MyChart) OR . A paper copy in the mail If you have any lab test that is abnormal or we need to change your treatment, we will call you to review the results.  Follow-Up: At [facility], you and your health needs are our priority.  As part of our continuing mission to provide you with exceptional heart care, we have created designated Provider Care Teams.  These Care Teams include your primary Cardiologist (physician) and Advanced Practice Providers (APPs -  Physician Assistants and Nurse Practitioners) who all work together to provide you with the care you need, when you need it.  We recommend signing up for the patient portal called "MyChart".  Sign up information is provided on this After Visit Summary.  MyChart is used to connect with patients for Virtual Visits (Telemedicine).  Patients are able to view lab/test results, encounter notes, upcoming appointments, etc.  Non-urgent messages can be sent to your provider as well.   To learn more about what you can do with MyChart, go to [email or web address].    Your next appointment:   3 month(s)  The format for your next appointment:   In Person  Provider:   [name], MD

## 2021-02-27 NOTE — ED Notes (Signed)
Dark green/blue top collected and sent w/lab orders. [employer]

## 2021-02-27 NOTE — ED Triage Notes (Signed)
Pt reports blood work drawn today at doctors office today. Potassium resulted 6.5. Pain pump LLQ abdomen. Oral Chemotherapy changed recently.

## 2021-02-27 NOTE — Telephone Encounter (Signed)
Agree with ER referral.   [place] T. Audie Box, MD, [place]  [address], [address] [place], [place] [address] [phone]  5:03 PM

## 2021-02-28 ENCOUNTER — Emergency Department (HOSPITAL_COMMUNITY)
Admission: EM | Admit: 2021-02-28 | Discharge: 2021-02-28 | Disposition: A | Discharge status: Home / Self Care | Payer: Medicare Other | Attending: Emergency Medicine | Admitting: Emergency Medicine

## 2021-02-28 DIAGNOSIS — I10 Essential (primary) hypertension: Secondary | ICD-10-CM | POA: Diagnosis not present

## 2021-02-28 DIAGNOSIS — E875 Hyperkalemia: Secondary | ICD-10-CM | POA: Diagnosis not present

## 2021-02-28 LAB — MAGNESIUM: Magnesium: 2.1 mg/dL (ref 1.7–2.4)

## 2021-02-28 LAB — LIPASE, BLOOD: Lipase: 24 U/L (ref 11–51)

## 2021-02-28 MED ORDER — SODIUM ZIRCONIUM CYCLOSILICATE 5 G PO PACK
5.0000 g | PACK | Freq: Once | ORAL | Status: AC
  Administered 2021-02-28: 5 g via ORAL
  Filled 2021-02-28: qty 1

## 2021-02-28 NOTE — Discharge Instructions (Addendum)
We discussed obtaining a potassium recheck tomorrow or the following day.  You may obtain this via PCP or your oncologist.  You experience any changes such as shortness of breath, chest pain, lightheadedness, other symptoms please return to the emergency room

## 2021-02-28 NOTE — ED Provider Notes (Signed)
[facility] DEPT Provider Note   CSN: [id number] Arrival date & time: 02/27/21  1919     History Chief Complaint  Patient presents with  . Abnormal Lab    Keith [name] is a 55 y.o. male.  55 y.o male with a PMH of DM, Thoracic aortic aneurysm, DM, renal CA with metastasis to the pancreas, bone presents to the ED with a chief complaint of elevated potassium.  Patient was evaluated by cardiologist Dr. Davina Poke for follow-up yesterday morning, had his labs drawn and had a potassium critical at 6.5, he does have a prior history of hyperkalemia.  He is report a change in oral chemotherapy last week, states he is on a new regimen but unknown on the name.  According to Dr. Verlon Au note, he had medication changes had losartan stopped, was given a new blood pressure medication which she reports he has not picked up yet.  He is also followed by Dr. Tami Lin for his renal CA.  He was admitted in the past for hyperkalemia.  He is asymptomatic on today's visit, does have a prior history of anemia but reports no complaints on today's visit.  No fever, chest pain, shortness of breath or any complaints.  The history is provided by the patient.  Abnormal Lab Patient referred by:  Specialist Resulting agency:  Internal Result type: chemistry   Chemistry:    Sodium:  High   Other abnormal chemistry result:  6.5      Past Medical History:  Diagnosis Date  . Diabetes mellitus without complication ([place])    diet controlled only  . GI bleed   . left renal ca d'd 2008  . 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 Metastasis to bone [facility]) 2014/2016    Patient Active Problem List   Diagnosis Date Noted  . Hyperkalemia 02/15/2021  . Tremor 02/13/2021  . Thoracic aortic aneurysm without rupture ([place]) 02/13/2021  . Pulmonary emphysema ([place]) 02/13/2021  . Personal history of pulmonary embolism 02/13/2021  . Personal history of other  diseases of the digestive system 02/13/2021  . Other abnormalities of gait and mobility 02/13/2021  . Neuropathy 02/13/2021  . Long term (current) use of anticoagulants 02/13/2021  . History of nephrectomy 02/13/2021  . History of depression 02/13/2021  . Helicobacter pylori (H. pylori) as the cause of diseases classified elsewhere 02/13/2021  . Hardening of the aorta (main artery of the heart) ([place]) 02/13/2021  . Encounter for general adult medical examination with abnormal findings 02/13/2021  . Chronic pain after cancer treatment 02/13/2021  . Chronic combined systolic and diastolic heart failure ([place]) 02/13/2021  . Anxiety disorder 02/13/2021  . Pleural effusion   . Shortness of breath 01/31/2021  . Anasarca 01/31/2021  . Angiodysplasia of colon with hemorrhage   . GI bleed 12/18/2020  . Occult GI bleeding   . Acute GI bleeding 02/29/2020  . Syncope 02/28/2020  . Orthostatic hypotension 02/28/2020  . Pain of left lower leg 02/22/2020  . Anemia due to blood loss 10/19/2019  . Elevated blood-pressure reading, without diagnosis of hypertension 10/19/2019  . UGIB (upper gastrointestinal bleed) 08/28/2019  . Acute upper GI bleed 08/28/2019  . Acute esophagitis   . Gastritis and gastroduodenitis   . Hematochezia   . Presence of other specified functional implants 08/17/2019  . Pain in femur 01/27/2018  . Closed fracture of right femur ([place]) 01/27/2018  . Anemia 07/23/2017  . Pulmonary embolism ([place])   .  Depressive disorder 11/13/2015  . Benign essential hypertension 11/13/2015  . Malnutrition of moderate degree 11/12/2015  . Malnutrition, calorie ([place]) 11/12/2015  . HCAP (healthcare-associated pneumonia) 11/11/2015  . Acute pulmonary embolism ([place]) 11/11/2015  . Secondary malignant neoplasm of lung ([place]) 11/11/2015  . Right middle lobe, bilateral lower lobes pneumonia  11/11/2015  . Acute respiratory failure ([place]) 11/11/2015  . Thrombocytopenia due to drugs 11/11/2015  .  Antineoplastic chemotherapy induced anemia 11/11/2015  . Hyponatremia 11/11/2015  . Cancer related pain 11/11/2015  . Transaminitis 11/11/2015  . Diabetes mellitus ([place]) 11/11/2015  . Therapeutic opioid induced constipation 11/11/2015  . Pain from bone metastases ([place]) 11/11/2015  . Hip pain 06/19/2015  . Lumbosacral radiculopathy 02/21/2015  . Bone metastasis ([place]) 12/25/2014  . Metastatic renal cell carcinoma ([place]) 12/25/2014  . Polysubstance abuse-cocaine and THC. 04/25/2013  . Malignant tumor of kidney ([place]) 08/12/2011    Past Surgical History:  Procedure Laterality Date  . BIOPSY  08/28/2019   Procedure: BIOPSY;  Surgeon: [place] Park, MD;  Location: [facility];  Service: Gastroenterology;;  . BIOPSY  03/01/2020   Procedure: BIOPSY;  Surgeon: Mauri Pole, MD;  [facility]: [facility];  Service: Endoscopy;;  . BIOPSY  12/19/2020   Procedure: BIOPSY;  Surgeon: Yetta Flock, MD;  [facility];  Service: Gastroenterology;;  . BIOPSY  12/20/2020   Procedure: BIOPSY;  Surgeon: Gatha Mayer, MD;  [facility];  Service: Endoscopy;;  . COLONOSCOPY W/ POLYPECTOMY    . COLONOSCOPY WITH PROPOFOL N/A 03/01/2020   Procedure: COLONOSCOPY WITH PROPOFOL;  Surgeon: Mauri Pole, MD;  [facility];  Service: Endoscopy;  Laterality: N/A;  . COLONOSCOPY WITH PROPOFOL N/A 12/20/2020   Procedure: COLONOSCOPY WITH PROPOFOL;  Surgeon: Gatha Mayer, MD;  [facility];  Service: Endoscopy;  Laterality: N/A;  . ESOPHAGOGASTRODUODENOSCOPY (EGD) WITH PROPOFOL N/A 08/28/2019   Procedure: ESOPHAGOGASTRODUODENOSCOPY (EGD) WITH PROPOFOL;  Surgeon: [place] Park, MD;  [facility];  Service: Gastroenterology;  Laterality: N/A;  . ESOPHAGOGASTRODUODENOSCOPY (EGD) WITH PROPOFOL N/A 03/01/2020   Procedure: ESOPHAGOGASTRODUODENOSCOPY (EGD) WITH PROPOFOL;  Surgeon: Mauri Pole, MD;  [facility];  Service: Endoscopy;   Laterality: N/A;  . ESOPHAGOGASTRODUODENOSCOPY (EGD) WITH PROPOFOL N/A 12/19/2020   Procedure: ESOPHAGOGASTRODUODENOSCOPY (EGD) WITH PROPOFOL;  Surgeon: Yetta Flock, MD;  [facility];  Service: Gastroenterology;  Laterality: N/A;  . FEMUR IM NAIL Right 02/16/2018   Procedure: INTRAMEDULLARY (IM) NAIL RIGHT FEMORAL;  Surgeon: Paralee Cancel, MD;  [facility];  Service: Orthopedics;  Laterality: Right;  . GIVENS CAPSULE STUDY N/A 03/03/2020   Procedure: GIVENS CAPSULE STUDY;  Surgeon: Mauri Pole, MD;  [facility];  Service: Endoscopy;  Laterality: N/A;  . HAND SURGERY Right   . HEMOSTASIS CLIP PLACEMENT  12/20/2020   Procedure: HEMOSTASIS CLIP PLACEMENT;  Surgeon: Gatha Mayer, MD;  [facility];  Service: Endoscopy;;  . HOT HEMOSTASIS N/A 12/20/2020   Procedure: HOT HEMOSTASIS (ARGON PLASMA COAGULATION/BICAP);  Surgeon: Gatha Mayer, MD;  [facility];  Service: Endoscopy;  Laterality: N/A;  . IR ANGIOGRAM EXTREMITY RIGHT  02/15/2018  . IR ANGIOGRAM SELECTIVE EACH ADDITIONAL VESSEL  02/15/2018  . IR ANGIOGRAM SELECTIVE EACH ADDITIONAL VESSEL  02/15/2018  . IR ANGIOGRAM SELECTIVE EACH ADDITIONAL VESSEL  02/15/2018  . IR ANGIOGRAM SELECTIVE EACH ADDITIONAL VESSEL  02/15/2018  . IR EMBO TUMOR ORGAN ISCHEMIA INFARCT INC GUIDE ROADMAPPING  02/15/2018  . IR EMBO TUMOR ORGAN ISCHEMIA INFARCT INC GUIDE ROADMAPPING  02/15/2018  . IR US GUIDE VASC ACCESS LEFT  02/15/2018  . LUNG REMOVAL, PARTIAL Right 09/2008  . NEPHRECTOMY RADICAL     Left   . PAIN PUMP IMPLANTATION N/A 05/16/2015   Procedure: Intrathecal pain pump placement;  Surgeon: Clydell Hakim, MD;  Location: [facility];  Service: Neurosurgery;  Laterality: N/A;  Intrathecal pain pump placement       Family History  Problem Relation Age of Onset  . Hypertension Mother   . Diabetes Brother   . Irritable bowel syndrome Sister   . Cancer Maternal Aunt        ovarian  . Colon  cancer Neg Hx     Social History   Tobacco Use  . Smoking status: Current Every Day Smoker    Packs/day: 0.50    Years: 30.00    Pack years: 15.00    Types: Cigarettes  . Smokeless tobacco: Never Used  Vaping Use  . Vaping Use: Never used  Substance Use Topics  . Alcohol use: No  . Drug use: Yes    Types: Marijuana    Comment: occasional. Last used: last night.     Home Medications Prior to Admission medications   Medication Sig Start Date End Date Taking? Authorizing Provider  ALPRAZolam (XANAX) 1 MG tablet TAKE 1 TABLET BY MOUTH THREE TIMES A DAY AS NEEDED FOR ANXIETY Patient taking differently: Take 1 mg by mouth 3 (three) times daily as needed for anxiety. 02/12/21  Yes [name], MD  axitinib (INLYTA) 5 MG tablet Take 1 tablet (5 mg total) by mouth 2 (two) times daily. 02/18/21  Yes [name], MD  carvedilol (COREG) 12.5 MG tablet Take 1 tablet (12.5 mg total) by mouth 2 (two) times daily with a meal. 02/27/21  Yes [name], [name] Freer, MD  Cyanocobalamin (B-12) 1000 MCG TABS Take 1 tablet (1,000 mcg total) by mouth daily. 12/31/20  Yes Willia Craze, NP  diphenhydrAMINE (BENADRYL) 25 MG tablet Take 25 mg by mouth every 6 (six) hours as needed for allergies.   Yes [provider]  diphenhydramine-acetaminophen (TYLENOL PM) 25-500 MG TABS tablet Take 1 tablet by mouth at bedtime as needed (pain).   Yes [provider]  ferrous sulfate 325 (65 FE) MG tablet TAKE 1 TABLET BY MOUTH EVERY DAY WITH BREAKFAST Patient taking differently: Take 325 mg by mouth daily. 10/24/20  Yes Zehr, Laban Emperor, PA-C  furosemide (LASIX) 20 MG tablet Take 1 tablet (20 mg total) by mouth daily. 02/27/21  Yes [name], [name] Freer, MD  hydrALAZINE (APRESOLINE) 25 MG tablet Take 1 tablet (25 mg total) by mouth 3 (three) times daily. 02/27/21 05/28/21 Yes [name], [name] Freer, MD  HYDROmorphone (DILAUDID) 8 MG tablet Take 1 tablet (8 mg total) by mouth every 4 (four) hours as needed  for severe pain. Patient taking differently: Take 8 mg by mouth every 4 (four) hours as needed for moderate pain or severe pain. 02/17/18  Yes Danae Orleans, PA-C  magic mouthwash SOLN Take 5 mLs by mouth in the morning, at noon, in the evening, and at bedtime. Swish and Spit. 02/26/21  Yes [name], MD  Magnesium Oxide 400 MG CAPS Take 1 capsule (400 mg total) by mouth daily. 02/06/21  Yes Shelly Coss, MD  ondansetron (ZOFRAN) 4 MG tablet Take one tablet every morning.  Can take one tablet later in the day on an as needed basis Patient taking differently: Take 4 mg by mouth daily. 10/10/20  Yes Rachael Fee, MD  PAIN MANAGEMENT INTRATHECAL, IT, PUMP 1 each by Intrathecal route continuous. Intrathecal (IT) medication:   Morphine.   Yes [provider]  pantoprazole (PROTONIX) 40 MG tablet Take 1 tablet (40 mg total) by mouth daily. 12/21/20  Yes Meredeth Ide, MD    Allergies    Ceftriaxone and Hydrocodone  Review of Systems   Review of Systems  Constitutional: Negative for fever.  HENT: Negative for sore throat.   Respiratory: Negative for shortness of breath.   Cardiovascular: Negative for chest pain.  Gastrointestinal: Negative for abdominal pain, nausea and vomiting.  Genitourinary: Negative for flank pain.  Musculoskeletal: Negative for back pain.  Neurological: Negative for weakness, light-headedness and headaches.  All other systems reviewed and are negative.   Physical Exam Updated Vital Signs BP 122/83   Pulse (!) 58   Temp 97.8 F (36.6 C) (Oral)   Resp 13   Ht 6\' 4"  (1.93 m)   Wt 74.8 kg   SpO2 100%   BMI 20.08 kg/m   Physical Exam Vitals and nursing note reviewed.  Constitutional:      Appearance: Normal appearance. He is ill-appearing.     Comments: Cachectic, chronically ill-appearing.  HENT:     Head: Normocephalic and atraumatic.     Mouth/Throat:     Mouth: Mucous membranes are moist.  Eyes:     Pupils: Pupils are equal, round,  and reactive to light.  Cardiovascular:     Rate and Rhythm: Normal rate.  Pulmonary:     Effort: Pulmonary effort is normal.     Breath sounds: No wheezing or rales.  Abdominal:     General: Abdomen is flat.     Tenderness: There is no abdominal tenderness. There is no right CVA tenderness or left CVA tenderness.       Comments: Morphine pump in place.  Musculoskeletal:     Cervical back: Normal range of motion and neck supple.     Right lower leg: Edema present.     Left lower leg: Edema present.  Skin:    General: Skin is warm and dry.  Neurological:     Mental Status: He is alert and oriented to person, place, and time.     ED Results / Procedures / Treatments   Labs (all labs ordered are listed, but only abnormal results are displayed) Labs Reviewed  CBC - Abnormal; Notable for the following components:      Result Value   RBC 3.31 (*)    Hemoglobin 9.9 (*)    HCT 34.3 (*)    MCV 103.6 (*)    MCHC 28.9 (*)    RDW 17.6 (*)    All other components within normal limits  COMPREHENSIVE METABOLIC PANEL - Abnormal; Notable for the following components:   Potassium 5.5 (*)    Glucose, Bld 144 (*)    BUN 32 (*)    Creatinine, Ser 1.62 (*)    Albumin 3.2 (*)    GFR, Estimated 50 (*)    All other components within normal limits  LIPASE, BLOOD  MAGNESIUM    EKG None  Radiology No results found.  Procedures Procedures   Medications Ordered in ED Medications  sodium zirconium cyclosilicate (LOKELMA) packet 5 g (5 g Oral Given 02/28/21 0907)    ED Course  I have reviewed the triage vital signs and the nursing notes.  Pertinent labs & imaging results that were available during my care of the patient  were reviewed by me and considered in my medical decision making (see chart for details).  Clinical Course as of 02/28/21 1040  Thu Feb 28, 2021  0722 SpO2: 93 % 100% on RA resting in strecher. [JS]  9794 Potassium(!): 5.5 [JS]  0826 Magnesium: 2.1 [JS]     Clinical Course User Index [JS] Janeece Fitting, PA-C   MDM Rules/Calculators/A&P   Patient with a history of renal CA with metastasis to the bone, pancreas presents to the ED and then by Dr. Davina Poke cardiology due to critical potassium of 6.5.  According to extensive chart review, patient was recently admitted for hyperkalemia, he was being overmedicated with potassium along with also being on losartan and Aldactone.  These medications have since been stopped.  He does have a prior history of PE, his blood thinners have currently been discontinue as patient does have a recent GI bleed on his chart records. Following information obtained from Dr. Vivia Ewing chart. Problem List 1. Stage IV Renal Cancer 2. PE 3. Recurrent GI Bleed/Aneima -recurrent transfusions  4. Diabetes 5. Systolic HF -EF 80-16% 6. CKD III 7. Anasarca/Hypoalbuminemia   Patient is also followed for his cancer by Dr. [name] of oncology.  On today's visit, labs were rechecked, potassium is 5.5, creatinine level is 1.6, this is within his baseline as he does have CKD 3.  LFTs are unremarkable.  CBC without any leukocytosis, his hemoglobin slightly decreased but does have a prior history of anemia.  He denies any dizziness, lightheadedness, weakness.  Lipase level is normal.  Magnesium is normal.  EKG is normal sinus rhythm, no widening QRS, or elevated T waves.  Patient will be provided with low, to help with hyper K.  We will also place call to Dr. Davina Poke cardiologist who sent patient in the ED for further recommendations.  8:42 AM Spoke to [employer], cardiology recommended repeat BMP after Lokelma.  Patient was provided with 5 alert,, will obtain repeat after completion of medication.  Discussed with patient he will need to obtain a potassium recheck the following day after receiving Lokelma today.  He continues to be asymptomatic at this time.  EKG is within normal limits, will have him follow-up in an outpatient basis.   Patient stable for discharge.  Portions of this note were generated with Lobby[employer]. Dictation errors may occur despite best attempts at proofreading.  Final Clinical Impression(s) / ED Diagnoses Final diagnoses:  Hyperkalemia    Rx / DC Orders ED Discharge Orders    None       Janeece Fitting, PA-C 02/28/21 1040    Breck Coons, MD 03/01/21 [phone]

## 2021-03-01 ENCOUNTER — Other Ambulatory Visit: Payer: Self-pay | Admitting: *Deleted

## 2021-03-01 DIAGNOSIS — E875 Hyperkalemia: Secondary | ICD-10-CM | POA: Diagnosis not present

## 2021-03-01 NOTE — Patient Outreach (Signed)
[facility]) Care Management  03/01/2021  BERLYN MALINA 1966-05-19 [id number]   [facility] multidisciplinary care discussion  Referral Date: 02/06/21 Referral Source: [facility] hospital liaison Referral Reason: assessments of community needs for care/disease management  Home health was not preferred at hospital discharge Insurance: [place] care [employer]) Last admission 01/31/21-02/06/21 for shortness of breath (sob) Dx acute hypoxic respiratory failure related to pulmonary embolism, acute combined systolic/diastolic congestive Heart Failure (CHF)   Case information presented No recommendations    Plan Patient will have a follow up outreach within the next 14-21 business days    Quinlan Mcfall L. Lavina Hamman, RN, BSN, [place] Coordinator Office number [phone] Main [facility] number [phone] Fax number [phone]

## 2021-03-06 ENCOUNTER — Other Ambulatory Visit: Payer: Self-pay | Admitting: *Deleted

## 2021-03-06 ENCOUNTER — Other Ambulatory Visit: Payer: Self-pay

## 2021-03-06 NOTE — Patient Outreach (Signed)
[facility]) Care Management  03/06/2021  SKYLAR PRIEST 26-Nov-1966 [id number]  [place] to complex care patient  Referral Date: 02/06/21 Referral Source: [facility] hospital liaison Referral Reason: assessments of community needs for care/disease management  Home health was not preferred at hospital discharge Insurance: [place] care [facility]) Last admission 01/31/21-02/06/21 for shortness of breath (sob) Dx acute hypoxic respiratory failure related to pulmonary embolism, acute combined systolic/diastolic congestive Heart Failure (CHF)  Follow up  Mr Mende is reporting he is doing well but attempting to keep his electrolytes balanced especially his potassium  Reviewed recent ED visit for hyperkalemia He reports frustration with the long wait time spent in ED Voiced concerns related to his treatment He is to see a MD on 03/07/21 1600 for scan  Spoke with [name] today   Encouraged communicate with MDs Oncologist reports other MDs making him comfortable He understands the his EF is 30%  Will not be able to afford medication if his providers continue changing them He reports paying $125 in the last 2 weeks for medicines He received outreach from pulmonologist and will follow up as requested  He is wanting to get back on chemo because he feels most of his symptoms are coming from his cancer He states he does not do strenuous activity He reports coping with cancer for 13 years  Allowed him to ventilate his feelings He spoke with nutritionist only in the hospital and wants to speak with another once after this month's testing is completed  He is now reading more labels reports noticing his boost is 480 mg  See Dr Shelia Media on 03/20/21 Today he reports having baked chicken, wheat bread sandwich, eating eggs apple  Plan Patient will have a follow up outreachwithin the next 30business days    Mattison Stuckey L. Lavina Hamman, RN, BSN, [place]  Coordinator Office number [phone] Main [facility] number [phone] Fax number [phone]

## 2021-03-13 ENCOUNTER — Other Ambulatory Visit: Payer: Self-pay | Admitting: Oncology

## 2021-03-13 ENCOUNTER — Telehealth: Payer: Self-pay | Admitting: Pharmacist

## 2021-03-13 DIAGNOSIS — E875 Hyperkalemia: Secondary | ICD-10-CM | POA: Diagnosis not present

## 2021-03-13 NOTE — Telephone Encounter (Signed)
Oral Chemotherapy Pharmacist Encounter   Received call from [facility] clinical pharmacist. Patient had potassium of 6 mmol/L at visit today. After discussion with Dr. [name], hyperkalemia is likely not secondary to [facility] since patient's first hyperkalemic episode documented 02/15/21 and Inlyta was started 02/22/21.   [facility] will manage patient's hyperkalemia at this time. They will also place a nephrology consult for patient for further workup. Dr. [name] aware.  Leron Croak, PharmD, BCPS Hematology/Oncology Clinical Pharmacist [facility] [phone] 03/13/2021 3:01 PM

## 2021-03-15 DIAGNOSIS — C642 Malignant neoplasm of left kidney, except renal pelvis: Secondary | ICD-10-CM | POA: Diagnosis not present

## 2021-03-15 DIAGNOSIS — E875 Hyperkalemia: Secondary | ICD-10-CM | POA: Diagnosis not present

## 2021-03-18 ENCOUNTER — Other Ambulatory Visit: Payer: Self-pay | Admitting: Oncology

## 2021-03-19 ENCOUNTER — Other Ambulatory Visit: Payer: Self-pay | Admitting: Oncology

## 2021-03-20 ENCOUNTER — Inpatient Hospital Stay: Payer: Medicare Other | Attending: Oncology

## 2021-03-20 ENCOUNTER — Other Ambulatory Visit: Payer: Self-pay

## 2021-03-20 ENCOUNTER — Emergency Department (HOSPITAL_COMMUNITY): Payer: Medicare Other

## 2021-03-20 ENCOUNTER — Emergency Department (HOSPITAL_COMMUNITY)
Admission: EM | Admit: 2021-03-20 | Discharge: 2021-03-21 | Disposition: A | Discharge status: Home / Self Care | Payer: Medicare Other | Source: Home / Self Care | Attending: Emergency Medicine | Admitting: Emergency Medicine

## 2021-03-20 ENCOUNTER — Encounter (HOSPITAL_COMMUNITY): Payer: Self-pay

## 2021-03-20 ENCOUNTER — Inpatient Hospital Stay: Payer: Medicare Other | Admitting: Oncology

## 2021-03-20 VITALS — BP 129/84 | HR 87 | Temp 97.6°F | Resp 20 | Ht 75.0 in | Wt 161.4 lb

## 2021-03-20 DIAGNOSIS — I110 Hypertensive heart disease with heart failure: Secondary | ICD-10-CM | POA: Insufficient documentation

## 2021-03-20 DIAGNOSIS — I2699 Other pulmonary embolism without acute cor pulmonale: Secondary | ICD-10-CM | POA: Diagnosis not present

## 2021-03-20 DIAGNOSIS — C7951 Secondary malignant neoplasm of bone: Secondary | ICD-10-CM | POA: Diagnosis not present

## 2021-03-20 DIAGNOSIS — Y9301 Activity, walking, marching and hiking: Secondary | ICD-10-CM | POA: Insufficient documentation

## 2021-03-20 DIAGNOSIS — Z85528 Personal history of other malignant neoplasm of kidney: Secondary | ICD-10-CM | POA: Insufficient documentation

## 2021-03-20 DIAGNOSIS — C642 Malignant neoplasm of left kidney, except renal pelvis: Secondary | ICD-10-CM | POA: Diagnosis not present

## 2021-03-20 DIAGNOSIS — S42215A Unspecified nondisplaced fracture of surgical neck of left humerus, initial encounter for closed fracture: Secondary | ICD-10-CM | POA: Insufficient documentation

## 2021-03-20 DIAGNOSIS — W01198A Fall on same level from slipping, tripping and stumbling with subsequent striking against other object, initial encounter: Secondary | ICD-10-CM | POA: Insufficient documentation

## 2021-03-20 DIAGNOSIS — Z905 Acquired absence of kidney: Secondary | ICD-10-CM | POA: Diagnosis not present

## 2021-03-20 DIAGNOSIS — Z85830 Personal history of malignant neoplasm of bone: Secondary | ICD-10-CM | POA: Insufficient documentation

## 2021-03-20 DIAGNOSIS — F17210 Nicotine dependence, cigarettes, uncomplicated: Secondary | ICD-10-CM | POA: Insufficient documentation

## 2021-03-20 DIAGNOSIS — C649 Malignant neoplasm of unspecified kidney, except renal pelvis: Secondary | ICD-10-CM | POA: Diagnosis not present

## 2021-03-20 DIAGNOSIS — S42212A Unspecified displaced fracture of surgical neck of left humerus, initial encounter for closed fracture: Secondary | ICD-10-CM | POA: Insufficient documentation

## 2021-03-20 DIAGNOSIS — M84422A Pathological fracture, left humerus, initial encounter for fracture: Secondary | ICD-10-CM | POA: Diagnosis not present

## 2021-03-20 DIAGNOSIS — Z9221 Personal history of antineoplastic chemotherapy: Secondary | ICD-10-CM | POA: Insufficient documentation

## 2021-03-20 DIAGNOSIS — W010XXA Fall on same level from slipping, tripping and stumbling without subsequent striking against object, initial encounter: Secondary | ICD-10-CM | POA: Diagnosis not present

## 2021-03-20 DIAGNOSIS — E1140 Type 2 diabetes mellitus with diabetic neuropathy, unspecified: Secondary | ICD-10-CM | POA: Insufficient documentation

## 2021-03-20 DIAGNOSIS — C7801 Secondary malignant neoplasm of right lung: Secondary | ICD-10-CM | POA: Diagnosis not present

## 2021-03-20 DIAGNOSIS — Z8507 Personal history of malignant neoplasm of pancreas: Secondary | ICD-10-CM | POA: Insufficient documentation

## 2021-03-20 DIAGNOSIS — Z85118 Personal history of other malignant neoplasm of bronchus and lung: Secondary | ICD-10-CM | POA: Insufficient documentation

## 2021-03-20 DIAGNOSIS — I5042 Chronic combined systolic (congestive) and diastolic (congestive) heart failure: Secondary | ICD-10-CM | POA: Insufficient documentation

## 2021-03-20 DIAGNOSIS — E875 Hyperkalemia: Secondary | ICD-10-CM | POA: Diagnosis not present

## 2021-03-20 LAB — CBC WITH DIFFERENTIAL (CANCER CENTER ONLY)
Abs Immature Granulocytes: 0.03 10*3/uL (ref 0.00–0.07)
Basophils Absolute: 0.1 10*3/uL (ref 0.0–0.1)
Basophils Relative: 1 %
Eosinophils Absolute: 0.2 10*3/uL (ref 0.0–0.5)
Eosinophils Relative: 2 %
HCT: 33.9 % — ABNORMAL LOW (ref 39.0–52.0)
Hemoglobin: 10 g/dL — ABNORMAL LOW (ref 13.0–17.0)
Immature Granulocytes: 0 %
Lymphocytes Relative: 10 %
Lymphs Abs: 1.1 10*3/uL (ref 0.7–4.0)
MCH: 28 pg (ref 26.0–34.0)
MCHC: 29.5 g/dL — ABNORMAL LOW (ref 30.0–36.0)
MCV: 95 fL (ref 80.0–100.0)
Monocytes Absolute: 0.7 10*3/uL (ref 0.1–1.0)
Monocytes Relative: 6 %
Neutro Abs: 8.6 10*3/uL — ABNORMAL HIGH (ref 1.7–7.7)
Neutrophils Relative %: 81 %
Platelet Count: 293 10*3/uL (ref 150–400)
RBC: 3.57 MIL/uL — ABNORMAL LOW (ref 4.22–5.81)
RDW: 17.2 % — ABNORMAL HIGH (ref 11.5–15.5)
WBC Count: 10.7 10*3/uL — ABNORMAL HIGH (ref 4.0–10.5)
nRBC: 0 % (ref 0.0–0.2)

## 2021-03-20 LAB — CMP (CANCER CENTER ONLY)
ALT: 25 U/L (ref 0–44)
AST: 16 U/L (ref 15–41)
Albumin: 2.9 g/dL — ABNORMAL LOW (ref 3.5–5.0)
Alkaline Phosphatase: 135 U/L — ABNORMAL HIGH (ref 38–126)
Anion gap: 10 (ref 5–15)
BUN: 16 mg/dL (ref 6–20)
CO2: 29 mmol/L (ref 22–32)
Calcium: 9.1 mg/dL (ref 8.9–10.3)
Chloride: 101 mmol/L (ref 98–111)
Creatinine: 1.45 mg/dL — ABNORMAL HIGH (ref 0.61–1.24)
GFR, Estimated: 58 mL/min — ABNORMAL LOW (ref >60)
Glucose, Bld: 85 mg/dL (ref 70–99)
Potassium: 4.6 mmol/L (ref 3.5–5.1)
Sodium: 140 mmol/L (ref 135–145)
Total Bilirubin: 0.3 mg/dL (ref 0.3–1.2)
Total Protein: 8 g/dL (ref 6.5–8.1)

## 2021-03-20 MED ORDER — HYDROMORPHONE HCL 2 MG/ML IJ SOLN
2.0000 mg | Freq: Once | INTRAMUSCULAR | Status: AC
  Administered 2021-03-20: 2 mg via INTRAVENOUS
  Filled 2021-03-20: qty 1

## 2021-03-20 MED ORDER — HYDROMORPHONE HCL 8 MG PO TABS: 8.0000 mg | ORAL_TABLET | Freq: Three times a day (TID) | ORAL | 0 refills | Status: DC

## 2021-03-20 MED ORDER — MORPHINE SULFATE (PF) 4 MG/ML IV SOLN
8.0000 mg | Freq: Once | INTRAVENOUS | Status: AC
Start: 2021-03-20 — End: 2021-03-20
  Administered 2021-03-20: 8 mg via INTRAVENOUS
  Filled 2021-03-20: qty 2

## 2021-03-20 MED ORDER — HYDROMORPHONE HCL 8 MG PO TABS: 8.0000 mg | ORAL_TABLET | Freq: Three times a day (TID) | ORAL | 0 refills | Status: AC

## 2021-03-20 NOTE — Discharge Instructions (Addendum)
We have placed your left shoulder on a sling, please keep this in place until you follow-up with Dr. Victorino December.  I have prescribed a short course of pain medication to help with the pain, you may also continue to apply ICE to the area.

## 2021-03-20 NOTE — ED Notes (Signed)
Pt currently in xray

## 2021-03-20 NOTE — Progress Notes (Signed)
Hematology and Oncology Follow Up   Keith [name] [id number] 1966-06-12 55 y.o. 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     Prior Therapy: 1. Status post laparoscopic radical nephrectomy.  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  2. Patient status post thoracotomy for a synchronous metastatic lung lesions done October 2009.   3. Patient is status post stereotactic radiotherapy to pulmonary nodules in May of 2010. 4. He is S/P Sutent 50 mg 4 weeks on 2 weeks off from 10/2010 to 03/2013. He progressed at that time.  5. He is S/P radiation to the right sacral bone between 4/22 to 4/30.  6. He is S/P XRT to the left shoulder 03/20/14 to 03/31/14. 7. Votrient 800 mg by mouth daily from 03/2013 through 06/22/2015. Discontinued secondary to disease progression. 8. Nivolumab 3 mg/kg given every 2 weeks started on 06/29/2015. He is status post 4 cycles completed 08/10/2015. He developed disease progression in September 2016.  9. Status post radiation therapy to the left mid fibula completed on 11/14/2015. He received a grade 1 fraction. 10. He is status post radiation to the proximal and distal femur over 2 weeks and 10 fractions of total of 30 Gy.  This was completed in March 2019. 11.   Radiation therapy to the right tibia.  Last treatment completed in September 2019. 12. Cabometyx 60 mg daily started in November 2016.  Therapy discontinued and February 2022.  Progression of disease.  Current therapy: Axitinib 5 mg twice daily started February 22, 2021.  Interim History: Keith [name] is here for repeat follow-up.  Since the last visit, he was seen in the emergency department for hyperkalemia with a potassium of 6.5 that was corrected without hospitalization.  Since that time, he reports of feeling reasonably well  without any recent complaints.  He denies any hospitalizations or illnesses.  His lower extremity edema overall anasarca has improved.  He has tolerated axitinib without any new complaints.  He denies any nausea vomiting or fatigue.  He denies any hematochezia or melena.    Medications: Unchanged on review. Current Outpatient Medications  Medication Sig Dispense Refill  . ALPRAZolam (XANAX) 1 MG tablet TAKE 1 TABLET BY MOUTH THREE TIMES A DAY AS NEEDED FOR ANXIETY 90 tablet 0  . carvedilol (COREG) 12.5 MG tablet Take 1 tablet (12.5 mg total) by mouth 2 (two) times daily with a meal. 60 tablet 2  . Cyanocobalamin (B-12) 1000 MCG TABS Take 1 tablet (1,000 mcg total) by mouth daily. 30 tablet   . diphenhydrAMINE (BENADRYL) 25 MG tablet Take 25 mg by mouth every 6 (six) hours as needed for allergies.    . diphenhydramine-acetaminophen (TYLENOL PM) 25-500 MG TABS tablet Take 1 tablet by mouth at bedtime as needed (pain).    . ferrous sulfate 325 (65 FE) MG tablet TAKE 1 TABLET BY MOUTH EVERY DAY WITH BREAKFAST (Patient taking differently: Take 325 mg by mouth daily.) 90 tablet 2  . furosemide (LASIX) 20 MG tablet Take 1 tablet (20 mg total) by mouth daily. 30 tablet 1  . hydrALAZINE (APRESOLINE) 25 MG tablet Take 1 tablet (25 mg total) by mouth 3 (three) times daily. 270 tablet 3  . HYDROmorphone (DILAUDID) 8 MG tablet Take 1 tablet (8 mg total) by mouth every 4 (four) hours as needed for severe  pain. (Patient taking differently: Take 8 mg by mouth every 4 (four) hours as needed for moderate pain or severe pain.) 30 tablet 0  . INLYTA 5 MG tablet TAKE 1 TABLET (5 MG TOTAL) BY MOUTH 2 (TWO) TIMES DAILY. 60 tablet 0  . magic mouthwash SOLN Take 5 mLs by mouth in the morning, at noon, in the evening, and at bedtime. Swish and Spit. 240 mL 0  . Magnesium Oxide 400 MG CAPS Take 1 capsule (400 mg total) by mouth daily. 7 capsule 0  . ondansetron (ZOFRAN) 4 MG tablet Take one tablet every morning.  Can take  one tablet later in the day on an as needed basis (Patient taking differently: Take 4 mg by mouth daily.) 50 tablet 11  . PAIN MANAGEMENT INTRATHECAL, IT, PUMP 1 each by Intrathecal route continuous. Intrathecal (IT) medication:   Morphine.    . pantoprazole (PROTONIX) 40 MG tablet Take 1 tablet (40 mg total) by mouth daily. 30 tablet 3   No current facility-administered medications for this visit.     Allergies:  Allergies  Allergen Reactions  . Ceftriaxone Hives    Other reaction(s): red streaks  . Hydrocodone Swelling    Physical examination:  Blood pressure 129/84, pulse 87, temperature 97.6 F (36.4 C), temperature source Temporal, resp. rate 20, height 6\' 3"  (1.905 m), weight 161 lb 6.4 oz (73.2 kg), SpO2 96 %.     ECOG 2   General appearance: Alert, awake without any distress. Head: Atraumatic without abnormalities Oropharynx: Without any thrush or ulcers. Eyes: No scleral icterus. Lymph nodes: No lymphadenopathy noted in the cervical, supraclavicular, or axillary nodes Heart:regular rate and rhythm, without any murmurs or gallops.   Lung: Clear to auscultation without any rhonchi, wheezes or dullness to percussion. Abdomin: Soft, nontender without any shifting dullness or ascites. Musculoskeletal: No clubbing or cyanosis. Neurological: No motor or sensory deficits. Skin: No rashes or lesions.                Lab Results: Lab Results  Component Value Date   WBC 8.0 02/27/2021   HGB 9.9 (L) 02/27/2021   HCT 34.3 (L) 02/27/2021   MCV 103.6 (H) 02/27/2021   PLT 238 02/27/2021     Chemistry      Component Value Date/Time   NA 139 02/27/2021 1941   NA 138 02/27/2021 1102   NA 142 12/24/2017 1317   K 5.5 (H) 02/27/2021 1941   K 4.3 12/24/2017 1317   CL 104 02/27/2021 1941   CL 106 05/24/2013 1436   CO2 28 02/27/2021 1941   CO2 32 (H) 12/24/2017 1317   BUN 32 (H) 02/27/2021 1941   BUN 28 (H) 02/27/2021 1102   BUN 10.3 12/24/2017 1317    CREATININE 1.62 (H) 02/27/2021 1941   CREATININE 1.52 (H) 02/18/2021 1003   CREATININE 0.9 12/24/2017 1317   GLU 160 (H) 06/29/2015 0957      Component Value Date/Time   CALCIUM 9.0 02/27/2021 1941   CALCIUM 8.8 12/24/2017 1317   ALKPHOS 98 02/27/2021 1941   ALKPHOS 95 12/24/2017 1317   AST 17 02/27/2021 1941   AST 17 02/18/2021 1003   AST 20 12/24/2017 1317   ALT 18 02/27/2021 1941   ALT 25 02/18/2021 1003   ALT 25 12/24/2017 1317   BILITOT 0.4 02/27/2021 1941   BILITOT 0.3 02/18/2021 1003   BILITOT 0.25 12/24/2017 1317       Impression and Plan:   55 year old man with  1.  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  He is status post multiple therapies outlined above.   His disease status was updated at this time and treatment options were reviewed.  Risks and benefits of continuing this treatment were discussed.  Complication with hypertension, weight loss and diarrhea were reiterated.  Alternative treatment options would be immunotherapy combination which she is not a candidate for at this time.  I recommend updating his staging scans and 8 weeks.  2.  Pulmonary embolism: He is off Xarelto at this time given his recurrent GI bleeding.  3.  Anemia: Hemoglobin have stabilized without any recurrent GI bleed off Xarelto.  4.    Lower extremity edema: Resolved at this time and currently on diuretics.  5.  Prognosis: Therapy remains palliative and his overall prognosis is poor given his debilitated status and multiple anticancer treatment in the last 13 years.   6.  Hyperkalemia: Unrelated to Inlyta at this time.  His hyperkalemia has proceeded the start of this drug and likely related to his worsening kidney function.  I recommended follow-up with nephrology.   7.  Follow-up: In 8 weeks for repeat evaluation and updating his staging scans.   30  minutes were spent on this encounter.  The time was dedicated to reviewing  laboratory data, disease status update and discussing future plan of care.  Zola Button, MD 03/20/2021 2:38 PM

## 2021-03-20 NOTE — ED Provider Notes (Signed)
[facility] DEPT Provider Note   CSN: [id number] Arrival date & time: 03/20/21  2109     History Chief Complaint  Patient presents with  . Shoulder Injury    Keith [name] is a 55 y.o. male.  55 y.o male with a PMH of DM, leg and neoplasm metastatic disease to the ED with a chief complaint of left shoulder pain status post mechanical fall. Patient reports he was walking outside when he suddenly tripped on his 120 pound she 2, states he fell landing mostly on his left side, reports pain to the left shoulder, worsened with movement.  He has not been able to move the left shoulder since incident occurred.  Not taking anything for pain control.  He does currently have a pain pump due to his ongoing metastatic disease.  Currently not on any blood thinners, denies any head pain or striking his head.  No other complaints.  Of note, patient did receive prior orthopedic care by Dr. Alvan Dame at [facility].  The history is provided by the patient.       Past Medical History:  Diagnosis Date  . Diabetes mellitus without complication ([place])    diet controlled only  . GI bleed   . left renal ca d'd 2008  . Malignant neoplasm metastatic to pancreas ([place]) 2012  . 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 Metastasis to bone [facility]) 2014/2016    Patient Active Problem List   Diagnosis Date Noted  . Hyperkalemia 02/15/2021  . Tremor 02/13/2021  . Thoracic aortic aneurysm without rupture ([place]) 02/13/2021  . Pulmonary emphysema ([place]) 02/13/2021  . Personal history of pulmonary embolism 02/13/2021  . Personal history of other diseases of the digestive system 02/13/2021  . Other abnormalities of gait and mobility 02/13/2021  . Neuropathy 02/13/2021  . Long term (current) use of anticoagulants 02/13/2021  . History of nephrectomy 02/13/2021  . History of depression 02/13/2021  . Helicobacter pylori (H. pylori) as the cause of diseases classified elsewhere 02/13/2021  . Hardening of the  aorta (main artery of the heart) ([place]) 02/13/2021  . Encounter for general adult medical examination with abnormal findings 02/13/2021  . Chronic pain after cancer treatment 02/13/2021  . Chronic combined systolic and diastolic heart failure ([place]) 02/13/2021  . Anxiety disorder 02/13/2021  . Pleural effusion   . Shortness of breath 01/31/2021  . Anasarca 01/31/2021  . Angiodysplasia of colon with hemorrhage   . GI bleed 12/18/2020  . Occult GI bleeding   . Acute GI bleeding 02/29/2020  . Syncope 02/28/2020  . Orthostatic hypotension 02/28/2020  . Pain of left lower leg 02/22/2020  . Anemia due to blood loss 10/19/2019  . Elevated blood-pressure reading, without diagnosis of hypertension 10/19/2019  . UGIB (upper gastrointestinal bleed) 08/28/2019  . Acute upper GI bleed 08/28/2019  . Acute esophagitis   . Gastritis and gastroduodenitis   . Hematochezia   . Presence of other specified functional implants 08/17/2019  . Pain in femur 01/27/2018  . Closed fracture of right femur ([place]) 01/27/2018  . Anemia 07/23/2017  . Pulmonary embolism ([place])   . Depressive disorder 11/13/2015  . Benign essential hypertension 11/13/2015  . Malnutrition of moderate degree 11/12/2015  . Malnutrition, calorie ([place]) 11/12/2015  . HCAP (healthcare-associated pneumonia) 11/11/2015  . Acute pulmonary embolism ([place]) 11/11/2015  . Secondary malignant neoplasm of lung ([place]) 11/11/2015  . Right middle lobe, bilateral lower lobes pneumonia  11/11/2015  . Acute respiratory failure ([place]) 11/11/2015  . Thrombocytopenia  due to drugs 11/11/2015  . Antineoplastic chemotherapy induced anemia 11/11/2015  . Hyponatremia 11/11/2015  . Cancer related pain 11/11/2015  . Transaminitis 11/11/2015  . Type 2 diabetes mellitus ([place]) 11/11/2015  . Therapeutic opioid induced constipation 11/11/2015  . Pain from bone metastases ([place]) 11/11/2015  . Hip pain 06/19/2015  . Lumbosacral radiculopathy 02/21/2015  . Bone  metastasis ([place]) 12/25/2014  . Metastatic renal cell carcinoma ([place]) 12/25/2014  . Polysubstance abuse-cocaine and THC. 04/25/2013  . Malignant tumor of kidney ([place]) 08/12/2011    Past Surgical History:  Procedure Laterality Date  . BIOPSY  08/28/2019   Procedure: BIOPSY;  Surgeon: Thornton Park, MD;  Location: [facility];  Service: Gastroenterology;;  . BIOPSY  03/01/2020   Procedure: BIOPSY;  Surgeon: Mauri Pole, MD;  Location: [facility];  Service: Endoscopy;;  . BIOPSY  12/19/2020   Procedure: BIOPSY;  Surgeon: Yetta Flock, MD;  Location: [facility];  Service: Gastroenterology;;  . BIOPSY  12/20/2020   Procedure: BIOPSY;  Surgeon: Gatha Mayer, MD;  Location: [facility];  Service: Endoscopy;;  . COLONOSCOPY W/ POLYPECTOMY    . COLONOSCOPY WITH PROPOFOL N/A 03/01/2020   Procedure: COLONOSCOPY WITH PROPOFOL;  Surgeon: Mauri Pole, MD;  Location: [facility];  Service: Endoscopy;  Laterality: N/A;  . COLONOSCOPY WITH PROPOFOL N/A 12/20/2020   Procedure: COLONOSCOPY WITH PROPOFOL;  Surgeon: Gatha Mayer, MD;  [facility]: [facility];  Service: Endoscopy;  Laterality: N/A;  . ESOPHAGOGASTRODUODENOSCOPY (EGD) WITH PROPOFOL N/A 08/28/2019   Procedure: ESOPHAGOGASTRODUODENOSCOPY (EGD) WITH PROPOFOL;  Surgeon: Thornton Park, MD;  [facility]: [facility];  Service: Gastroenterology;  Laterality: N/A;  . ESOPHAGOGASTRODUODENOSCOPY (EGD) WITH PROPOFOL N/A 03/01/2020   Procedure: ESOPHAGOGASTRODUODENOSCOPY (EGD) WITH PROPOFOL;  Surgeon: Mauri Pole, MD;  [facility];  Service: Endoscopy;  Laterality: N/A;  . ESOPHAGOGASTRODUODENOSCOPY (EGD) WITH PROPOFOL N/A 12/19/2020   Procedure: ESOPHAGOGASTRODUODENOSCOPY (EGD) WITH PROPOFOL;  Surgeon: Yetta Flock, MD;  [facility]: [facility];  Service: Gastroenterology;  Laterality: N/A;  . FEMUR IM NAIL Right 02/16/2018   Procedure: INTRAMEDULLARY (IM) NAIL RIGHT FEMORAL;  Surgeon: Paralee Cancel, MD;  Location: [facility];  Service: Orthopedics;  Laterality: Right;  . GIVENS CAPSULE STUDY N/A 03/03/2020   Procedure: GIVENS CAPSULE STUDY;  Surgeon: Mauri Pole, MD;  [facility]: [facility];  Service: Endoscopy;  Laterality: N/A;  . HAND SURGERY Right   . HEMOSTASIS CLIP PLACEMENT  12/20/2020   Procedure: HEMOSTASIS CLIP PLACEMENT;  Surgeon: Gatha Mayer, MD;  Location: [facility];  Service: Endoscopy;;  . HOT HEMOSTASIS N/A 12/20/2020   Procedure: HOT HEMOSTASIS (ARGON PLASMA COAGULATION/BICAP);  Surgeon: Gatha Mayer, MD;  Location: [facility];  Service: Endoscopy;  Laterality: N/A;  . IR ANGIOGRAM EXTREMITY RIGHT  02/15/2018  . IR ANGIOGRAM SELECTIVE EACH ADDITIONAL VESSEL  02/15/2018  . IR ANGIOGRAM SELECTIVE EACH ADDITIONAL VESSEL  02/15/2018  . IR ANGIOGRAM SELECTIVE EACH ADDITIONAL VESSEL  02/15/2018  . IR ANGIOGRAM SELECTIVE EACH ADDITIONAL VESSEL  02/15/2018  . IR EMBO TUMOR ORGAN ISCHEMIA INFARCT INC GUIDE ROADMAPPING  02/15/2018  . IR EMBO TUMOR ORGAN ISCHEMIA INFARCT INC GUIDE ROADMAPPING  02/15/2018  . IR US GUIDE VASC ACCESS LEFT  02/15/2018  . LUNG REMOVAL, PARTIAL Right 09/2008  . NEPHRECTOMY RADICAL     Left   . PAIN PUMP IMPLANTATION N/A 05/16/2015   Procedure: Intrathecal pain pump placement;  Surgeon: Clydell Hakim, MD;  Location: [facility];  Service: Neurosurgery;  Laterality: N/A;  Intrathecal pain pump placement  Family History  Problem Relation Age of Onset  . Hypertension Mother   . Diabetes Brother   . Irritable bowel syndrome Sister   . Cancer Maternal Aunt        ovarian  . Colon cancer Neg Hx     Social History   Tobacco Use  . Smoking status: Current Every Day Smoker    Packs/day: 0.50    Years: 30.00    Pack years: 15.00    Types: Cigarettes  . Smokeless tobacco: Never Used  Vaping Use  . Vaping Use: Never used  Substance Use Topics  . Alcohol use: No  . Drug use: Yes    Types: Marijuana    Comment: occasional.  Last used: last night.     Home Medications Prior to Admission medications   Medication Sig Start Date End Date Taking? Authorizing Provider  ALPRAZolam Duanne Moron) 1 MG tablet TAKE 1 TABLET BY MOUTH THREE TIMES A DAY AS NEEDED FOR ANXIETY 03/13/21   [name], MD  carvedilol (COREG) 12.5 MG tablet Take 1 tablet (12.5 mg total) by mouth 2 (two) times daily with a meal. 02/27/21   [name], [name] Freer, MD  Cyanocobalamin (B-12) 1000 MCG TABS Take 1 tablet (1,000 mcg total) by mouth daily. 12/31/20   Willia Craze, NP  diphenhydrAMINE (BENADRYL) 25 MG tablet Take 25 mg by mouth every 6 (six) hours as needed for allergies.    [provider]  diphenhydramine-acetaminophen (TYLENOL PM) 25-500 MG TABS tablet Take 1 tablet by mouth at bedtime as needed (pain).    [provider]  ferrous sulfate 325 (65 FE) MG tablet TAKE 1 TABLET BY MOUTH EVERY DAY WITH BREAKFAST Patient taking differently: Take 325 mg by mouth daily. 10/24/20   Zehr, Laban Emperor, PA-C  furosemide (LASIX) 20 MG tablet Take 1 tablet (20 mg total) by mouth daily. 02/27/21   O'NealCassie Freer, MD  hydrALAZINE (APRESOLINE) 25 MG tablet Take 1 tablet (25 mg total) by mouth 3 (three) times daily. 02/27/21 05/28/21  Geralynn Rile, MD  HYDROmorphone (DILAUDID) 8 MG tablet Take 1 tablet (8 mg total) by mouth in the morning, at noon, and at bedtime for 3 days. 03/20/21 03/23/21  Christiona Siddique, Beverley Fiedler, PA-C  INLYTA 5 MG tablet TAKE 1 TABLET (5 MG TOTAL) BY MOUTH 2 (TWO) TIMES DAILY. 03/19/21   [name], MD  magic mouthwash SOLN Take 5 mLs by mouth in the morning, at noon, in the evening, and at bedtime. Swish and Spit. 02/26/21   [name], MD  Magnesium Oxide 400 MG CAPS Take 1 capsule (400 mg total) by mouth daily. 02/06/21   Shelly Coss, MD  ondansetron (ZOFRAN) 4 MG tablet Take one tablet every morning.  Can take one tablet later in the day on an as needed basis Patient taking differently: Take 4 mg by mouth daily.  10/10/20   [name], MD  PAIN MANAGEMENT INTRATHECAL, IT, PUMP 1 each by Intrathecal route continuous. Intrathecal (IT) medication:   Morphine.    [provider]  pantoprazole (PROTONIX) 40 MG tablet Take 1 tablet (40 mg total) by mouth daily. 12/21/20   Oswald Hillock, MD  SPS 15 GM/60ML suspension Take by mouth. 03/14/21   [provider]    Allergies    Ceftriaxone and Hydrocodone  Review of Systems   Review of Systems  Constitutional: Negative for fever.  Respiratory: Negative for shortness of breath.   Cardiovascular: Negative for chest pain.  Gastrointestinal: Negative for abdominal pain.  Musculoskeletal: Positive for arthralgias.  Neurological: Negative for headaches.  All other systems reviewed and are negative.   Physical Exam Updated Vital Signs BP (!) 149/92   Pulse 82   Temp 98.3 F (36.8 C) (Oral)   Resp 15   SpO2 94%   Physical Exam Vitals and nursing note reviewed.  Constitutional:      Appearance: Normal appearance.  HENT:     Head: Normocephalic and atraumatic.     Nose: Nose normal.     Mouth/Throat:     Mouth: Mucous membranes are moist.  Cardiovascular:     Rate and Rhythm: Normal rate.  Pulmonary:     Effort: Pulmonary effort is normal.     Breath sounds: No wheezing.  Abdominal:     General: Abdomen is flat.     Tenderness: There is no abdominal tenderness. There is no right CVA tenderness or left CVA tenderness.  Musculoskeletal:        General: Tenderness and deformity present.     Left shoulder: Deformity, tenderness and bony tenderness present. No laceration. Decreased range of motion. Normal strength. Normal pulse.     Cervical back: Normal range of motion and neck supple.     Comments: Pulses are present, capillary refill is intact.  Pain with palpation along the humeral neck.  Bruising noted to the medial aspect of the left shoulder.Decrease ROM due to pain.   Skin:    General: Skin is warm and dry.   Neurological:     Mental Status: He is alert and oriented to person, place, and time.     ED Results / Procedures / Treatments   Labs (all labs ordered are listed, but only abnormal results are displayed) Labs Reviewed - No data to display  EKG None  Radiology DG Shoulder Left  Result Date: 03/20/2021 CLINICAL DATA:  Fall, left arm pain EXAM: LEFT SHOULDER - 2+ VIEW COMPARISON:  None. FINDINGS: There is a a lytic lesion involving the a surgical neck of the humerus complicated by a pathologic fracture with 2-3 cortical with medial displacement of the distal fracture fragment comprising the humeral shaft. Given the findings noted on recent CT examination of 01/31/2021, this likely represents a a metastatic focus of the patient's underlying renal cell carcinoma. The humeral head is still well seated within the glenoid fossa. Acromioclavicular joint space is preserved. Glenohumeral joint space is preserved. Limited evaluation of the left hemithorax is unremarkable. IMPRESSION: Lytic lesion within the a proximal left humerus complicated by pathologic fracture involving the surgical neck of the humerus with fracture fragments in near anatomic alignment. Electronically Signed   By: Fidela Salisbury MD   On: 03/20/2021 22:40    Procedures Procedures   Medications Ordered in ED Medications  HYDROmorphone (DILAUDID) injection 2 mg (has no administration in time range)  morphine 4 MG/ML injection 8 mg (8 mg Intravenous Given 03/20/21 2159)    ED Course  I have reviewed the triage vital signs and the nursing notes.  Pertinent labs & imaging results that were available during my care of the patient were reviewed by me and considered in my medical decision making (see chart for details).    MDM Rules/Calculators/A&P   Patient presents to the ED with a chief complaint of left shoulder pain status post mechanical fall.  Reports ambulating his dog this evening when he suddenly fell on the left  shoulder.  Did not strike his head, is currently not on  any blood thinners.  He does have a history of metastatic disease.  Decreased range of motion due to pain.  Vitals are within normal limits, patient is overall neurovascularly intact, there is an obvious deformity to the left shoulder, bruising is noted on the medial aspect.  DG left shoulder showed: Lytic lesion within the a proximal left humerus complicated by  pathologic fracture involving the surgical neck of the humerus with  fracture fragments in near anatomic alignment.     Patient does receive care by [facility] Dr. Alvan Dame, will repage him in order to obtain further recommendations.   11:06 PM spoke to Dr. Lyla Glassing of [facility] who recommended sling along with outpatient follow-up with Dr. Victorino December.  Patient will be sent home on a short course of pain medication.  And currently has a morphine pump, he reports there is no improvement in symptoms with morphine was received in his ED visit.  He does currently take Dilaudid 8 mg 3 times daily for metastatic disease, states he has run out of this prescription at this time.  PDMP has been reviewed, patient was provided with a for Dilaudid 8 mg 3 times daily for the next 3 days for acute pain due to his acute fracture of the left surgical neck.  Patient understands and agrees to management, return precautions discussed at length.  Patient stable for discharge.   Portions of this not were generated with [employer]. Dictation errors may occur despite best attempts at proofreading.  Final Clinical Impression(s) / ED Diagnoses Final diagnoses:  Closed nondisplaced fracture of surgical neck of left humerus, unspecified fracture morphology, initial encounter    Rx / DC Orders ED Discharge Orders         Ordered    HYDROmorphone (DILAUDID) 8 MG tablet  3 times daily,   Status:  Discontinued        03/20/21 2330    HYDROmorphone (DILAUDID) 8 MG tablet  3 times daily         03/20/21 2331           Janeece Fitting, PA-C 03/20/21 2332    Carmin Muskrat, MD 03/22/21 2320

## 2021-03-20 NOTE — ED Triage Notes (Signed)
Patient reports a mechanical fall over the neighbors dog today and hurt his left shoulder. Reports pain with movement and unable to move it without hearing "clicking"

## 2021-03-21 DIAGNOSIS — S42215A Unspecified nondisplaced fracture of surgical neck of left humerus, initial encounter for closed fracture: Secondary | ICD-10-CM | POA: Diagnosis not present

## 2021-03-21 NOTE — ED Notes (Signed)
Pt verbalized understanding of d/c, medication, and follow up care. Ambulatory with steady gait. D/C performed by Mortimer Fries, RN.

## 2021-03-22 MED FILL — INLYTA 5 MG TABLET: 5 | 30 days supply | Qty: 60 | Fill #0

## 2021-03-26 DIAGNOSIS — Z5181 Encounter for therapeutic drug level monitoring: Secondary | ICD-10-CM | POA: Diagnosis not present

## 2021-03-26 DIAGNOSIS — N189 Chronic kidney disease, unspecified: Secondary | ICD-10-CM | POA: Diagnosis not present

## 2021-03-26 DIAGNOSIS — G620 Drug-induced polyneuropathy: Secondary | ICD-10-CM | POA: Diagnosis not present

## 2021-03-26 DIAGNOSIS — N2581 Secondary hyperparathyroidism of renal origin: Secondary | ICD-10-CM | POA: Diagnosis not present

## 2021-03-26 DIAGNOSIS — N184 Chronic kidney disease, stage 4 (severe): Secondary | ICD-10-CM | POA: Diagnosis not present

## 2021-03-26 DIAGNOSIS — C642 Malignant neoplasm of left kidney, except renal pelvis: Secondary | ICD-10-CM | POA: Diagnosis not present

## 2021-03-26 DIAGNOSIS — N1831 Chronic kidney disease, stage 3a: Secondary | ICD-10-CM | POA: Diagnosis not present

## 2021-03-26 DIAGNOSIS — Z9689 Presence of other specified functional implants: Secondary | ICD-10-CM | POA: Diagnosis not present

## 2021-03-26 DIAGNOSIS — D631 Anemia in chronic kidney disease: Secondary | ICD-10-CM | POA: Diagnosis not present

## 2021-03-26 DIAGNOSIS — M84522A Pathological fracture in neoplastic disease, left humerus, initial encounter for fracture: Secondary | ICD-10-CM | POA: Diagnosis not present

## 2021-03-26 DIAGNOSIS — G894 Chronic pain syndrome: Secondary | ICD-10-CM | POA: Diagnosis not present

## 2021-03-27 ENCOUNTER — Telehealth (HOSPITAL_COMMUNITY): Payer: Self-pay

## 2021-03-27 ENCOUNTER — Other Ambulatory Visit (HOSPITAL_COMMUNITY): Payer: Self-pay | Admitting: Physician Assistant

## 2021-03-27 DIAGNOSIS — C649 Malignant neoplasm of unspecified kidney, except renal pelvis: Secondary | ICD-10-CM

## 2021-03-27 DIAGNOSIS — M84522A Pathological fracture in neoplastic disease, left humerus, initial encounter for fracture: Secondary | ICD-10-CM

## 2021-03-27 NOTE — Telephone Encounter (Signed)
-----   Message from Joanell Rising sent at 03/27/2021 11:06 AM EDT -----  ----- Message ----- From: Corrie Mckusick, DO Sent: 03/27/2021  11:00 AM EDT To: Joanell Rising  2 hours will be plenty.  With moderate sedation.  No reps needed.   Thank you JW  ----- Message ----- From: Joanell Rising Sent: 03/27/2021  10:51 AM EDT To: Corrie Mckusick, DO  Just moderate sedation? Is 2 hours enough time? And do you need anything special or a rep?  ----- Message ----- From: Corrie Mckusick, DO Sent: 03/27/2021  10:47 AM EDT To: Joanell Rising  Thank you.  Yes we can perform angio and embolization pre-operatively for Monday's case.   JW  ----- Message ----- From: Joanell Rising Sent: 03/27/2021  10:20 AM EDT To: Corrie Mckusick, DO  Hey Wags,  Dr. Stann Mainland from [facility] has this patient scheduled for surgery on Monday, April 11th for a pathological humerus fracture. You are at [facility] that day so that is why I am coming to you. Dr. Stann Mainland says the patient needs a renal tumor that needs to embolized prior to the surgery on his humerus. Can you do that Monday prior to his surgery at 2:30 pm? If you want I can have Dr. Stann Mainland call you directly. Let me know your thoughts.  Thanks [employer]

## 2021-03-29 ENCOUNTER — Other Ambulatory Visit: Payer: Self-pay | Admitting: Student

## 2021-03-29 ENCOUNTER — Other Ambulatory Visit (HOSPITAL_COMMUNITY)
Admission: RE | Admit: 2021-03-29 | Discharge: 2021-03-29 | Disposition: A | Discharge status: Home / Self Care | Payer: Medicare Other | Source: Ambulatory Visit | Attending: Orthopedic Surgery | Admitting: Orthopedic Surgery

## 2021-03-29 ENCOUNTER — Encounter (HOSPITAL_COMMUNITY): Payer: Self-pay | Admitting: Orthopedic Surgery

## 2021-03-29 DIAGNOSIS — Z20822 Contact with and (suspected) exposure to covid-19: Secondary | ICD-10-CM | POA: Diagnosis not present

## 2021-03-29 DIAGNOSIS — Z01812 Encounter for preprocedural laboratory examination: Secondary | ICD-10-CM | POA: Diagnosis not present

## 2021-03-29 NOTE — Progress Notes (Signed)
Spoke with pt's wife, Keith [name] for pre-op call. Pt asked that his wife do the call. Pt has recently been diagnosed with CHF and sees Dr. Audie Box. She states pt is NOT diabetic. Pt is being treated for Renal cell carcinoma.   Covid test done today, result pending.  Keith [name] states patient has been in quarantine since the test was done and understands that he stays in quarantine until he comes to the hospital on Monday.  Pt will have an IR procedure done prior to the humerus surgery.

## 2021-03-30 LAB — SARS CORONAVIRUS 2 (TAT 6-24 HRS): SARS Coronavirus 2: NEGATIVE

## 2021-04-01 ENCOUNTER — Other Ambulatory Visit: Payer: Self-pay

## 2021-04-01 ENCOUNTER — Encounter (HOSPITAL_COMMUNITY): Payer: Self-pay

## 2021-04-01 ENCOUNTER — Encounter (HOSPITAL_COMMUNITY): Payer: Self-pay | Admitting: Anesthesiology

## 2021-04-01 ENCOUNTER — Ambulatory Visit (HOSPITAL_COMMUNITY)
Admission: RE | Admit: 2021-04-01 | Discharge: 2021-04-01 | Disposition: A | Discharge status: Home / Self Care | Payer: Medicare Other | Source: Ambulatory Visit | Attending: Physician Assistant | Admitting: Physician Assistant

## 2021-04-01 ENCOUNTER — Encounter (HOSPITAL_COMMUNITY)
Admission: RE | Disposition: A | Discharge status: Home / Self Care | Payer: Self-pay | Source: Home / Self Care | Attending: Orthopedic Surgery

## 2021-04-01 DIAGNOSIS — M84522A Pathological fracture in neoplastic disease, left humerus, initial encounter for fracture: Secondary | ICD-10-CM | POA: Diagnosis not present

## 2021-04-01 DIAGNOSIS — Z885 Allergy status to narcotic agent status: Secondary | ICD-10-CM | POA: Diagnosis not present

## 2021-04-01 DIAGNOSIS — Z538 Procedure and treatment not carried out for other reasons: Secondary | ICD-10-CM | POA: Diagnosis not present

## 2021-04-01 DIAGNOSIS — M84422A Pathological fracture, left humerus, initial encounter for fracture: Secondary | ICD-10-CM | POA: Diagnosis not present

## 2021-04-01 DIAGNOSIS — C7951 Secondary malignant neoplasm of bone: Secondary | ICD-10-CM | POA: Diagnosis not present

## 2021-04-01 DIAGNOSIS — Z20822 Contact with and (suspected) exposure to covid-19: Secondary | ICD-10-CM | POA: Diagnosis not present

## 2021-04-01 DIAGNOSIS — Z79899 Other long term (current) drug therapy: Secondary | ICD-10-CM | POA: Diagnosis not present

## 2021-04-01 DIAGNOSIS — I110 Hypertensive heart disease with heart failure: Secondary | ICD-10-CM | POA: Diagnosis not present

## 2021-04-01 DIAGNOSIS — C649 Malignant neoplasm of unspecified kidney, except renal pelvis: Secondary | ICD-10-CM | POA: Diagnosis not present

## 2021-04-01 DIAGNOSIS — Z881 Allergy status to other antibiotic agents status: Secondary | ICD-10-CM | POA: Diagnosis not present

## 2021-04-01 DIAGNOSIS — Z905 Acquired absence of kidney: Secondary | ICD-10-CM | POA: Diagnosis not present

## 2021-04-01 DIAGNOSIS — M84552A Pathological fracture in neoplastic disease, left femur, initial encounter for fracture: Secondary | ICD-10-CM | POA: Diagnosis not present

## 2021-04-01 DIAGNOSIS — Z86711 Personal history of pulmonary embolism: Secondary | ICD-10-CM | POA: Diagnosis not present

## 2021-04-01 DIAGNOSIS — I509 Heart failure, unspecified: Secondary | ICD-10-CM | POA: Diagnosis not present

## 2021-04-01 LAB — BASIC METABOLIC PANEL
Anion gap: 7 (ref 5–15)
BUN: 26 mg/dL — ABNORMAL HIGH (ref 6–20)
CO2: 28 mmol/L (ref 22–32)
Calcium: 10.5 mg/dL — ABNORMAL HIGH (ref 8.9–10.3)
Chloride: 98 mmol/L (ref 98–111)
Creatinine, Ser: 1.5 mg/dL — ABNORMAL HIGH (ref 0.61–1.24)
GFR, Estimated: 55 mL/min — ABNORMAL LOW (ref >60)
Glucose, Bld: 118 mg/dL — ABNORMAL HIGH (ref 70–99)
Potassium: 6.2 mmol/L — ABNORMAL HIGH (ref 3.5–5.1)
Sodium: 133 mmol/L — ABNORMAL LOW (ref 135–145)

## 2021-04-01 LAB — CBC
HCT: 35.7 % — ABNORMAL LOW (ref 39.0–52.0)
Hemoglobin: 10.7 g/dL — ABNORMAL LOW (ref 13.0–17.0)
MCH: 28.5 pg (ref 26.0–34.0)
MCHC: 30 g/dL (ref 30.0–36.0)
MCV: 95.2 fL (ref 80.0–100.0)
Platelets: 278 10*3/uL (ref 150–400)
RBC: 3.75 MIL/uL — ABNORMAL LOW (ref 4.22–5.81)
RDW: 17.9 % — ABNORMAL HIGH (ref 11.5–15.5)
WBC: 6.9 10*3/uL (ref 4.0–10.5)
nRBC: 0 % (ref 0.0–0.2)

## 2021-04-01 LAB — PROTIME-INR
INR: 1.1 (ref 0.8–1.2)
Prothrombin Time: 13.6 seconds (ref 11.4–15.2)

## 2021-04-01 LAB — APTT: aPTT: 28 seconds (ref 24–36)

## 2021-04-01 SURGERY — INSERTION, INTRAMEDULLARY ROD, HUMERUS
Anesthesia: Choice | Laterality: Left

## 2021-04-01 MED ORDER — SODIUM CHLORIDE 0.9 % IV SOLN: INTRAVENOUS | Status: DC

## 2021-04-01 NOTE — Anesthesia Preprocedure Evaluation (Deleted)
Anesthesia Evaluation  Patient identified by MRN, date of birth, ID band Patient awake    Reviewed: Allergy & Precautions, H&P , NPO status , Patient's Chart, lab work & pertinent test results  Airway Mallampati: II  TM Distance: >3 FB Neck ROM: Full    Dental  (+) Edentulous Upper, Edentulous Lower   Pulmonary COPD, Current Smoker and Patient abstained from smoking., PE Lung mets from primary renal CA   Pulmonary exam normal        Cardiovascular hypertension, +CHF  Normal cardiovascular exam     Neuro/Psych Depression negative neurological ROS     GI/Hepatic (+)     substance abuse  , GI bleed   Endo/Other  diabetes  Renal/GU ARFRenal diseaseH/o Metastatic Renal CA  negative genitourinary   Musculoskeletal   Abdominal   Peds  Hematology  (+) Blood dyscrasia, anemia , Hgb 8.5, plts 109 Xarelto   Anesthesia Other Findings   Reproductive/Obstetrics negative OB ROS                            Anesthesia Physical  Anesthesia Plan  ASA: III  Anesthesia Plan: General   Post-op Pain Management: GA combined w/ Regional for post-op pain   Induction: Intravenous  PONV Risk Score and Plan: 1 and Ondansetron, Dexamethasone, Treatment may vary due to age or medical condition and Midazolam  Airway Management Planned: Oral ETT  Additional Equipment: None  Intra-op Plan:   Post-operative Plan: Extubation in OR  Informed Consent: I have reviewed the patients History and Physical, chart, labs and discussed the procedure including the risks, benefits and alternatives for the proposed anesthesia with the patient or authorized representative who has indicated his/her understanding and acceptance.     Dental advisory given  Plan Discussed with:   Anesthesia Plan Comments:        Anesthesia Quick Evaluation

## 2021-04-01 NOTE — H&P (Signed)
Chief Complaint: Patient was seen in consultation today for left upper extremity arteriogram with embolization at the request of [facility]  Referring Physician(s): Dr Eli Hose  Supervising Physician: Corrie Mckusick  Patient Status: [facility] - Out-pt  History of Present Illness: Keith [name] is a 55 y.o. 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 Mild enlargement of the left lower lobe perihilar mass, and of the right middle lobe/right upper lobe peribronchovascular mass. Increased left pleural effusion, now moderate to large. Stable small right pleural effusion. Stable small pericardial effusion. 2. Diffuse subcutaneous, mesenteric, and mediastinal edema compatible with third spacing of fluid. There is some hazy density in the lungs which also may be due to noncardiogenic edema. 3. Stable appearance of multiple pancreatic masses. 4. Stable osseous manifestations of metastatic disease, with the largest lesion in the right sacrum. 5. Emphysema and aortic atherosclerosis.  Fell at home 03/20/21 and came to ED for evaluation Xrays: IMPRESSION: Lytic lesion within the a proximal left humerus complicated by pathologic fracture involving the surgical neck of the humerus with fracture fragments in near anatomic alignment.  Pt is now scheduled for surgical repair of shoulder fracture: INTRAMEDULLARY (IM) NAIL HUMERUS With Dr Eli Hose today at [facility] has asked IR to embolize Lt humeral bone tumor prior to shoulder surgery today Dr Earleen Newport has seen pt and discussed procedure with him  Past Medical History:  Diagnosis Date  . Anemia   . Anxiety   . CHF (congestive heart failure) ([place])   . Diabetes mellitus without complication ([place])    diet controlled only - wife denies this  . GI bleed   . Hypertension   . left renal ca d'd 2008  . Malignant neoplasm  metastatic to pancreas ([place]) 2012  . 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 Metastasis to bone ([place]) 2014/2016  . Pulmonary embolus [facility])     Past Surgical History:  Procedure Laterality Date  . BIOPSY  08/28/2019   Procedure: BIOPSY;  Surgeon: Thornton Park, MD;  [facility]: [facility];  Service: Gastroenterology;;  . BIOPSY  03/01/2020   Procedure: BIOPSY;  Surgeon: Mauri Pole, MD;  [facility];  Service: Endoscopy;;  . BIOPSY  12/19/2020   Procedure: BIOPSY;  Surgeon: Yetta Flock, MD;  [facility];  Service: Gastroenterology;;  . BIOPSY  12/20/2020   Procedure: BIOPSY;  Surgeon: Gatha Mayer, MD;  [facility];  Service: Endoscopy;;  . COLONOSCOPY W/ POLYPECTOMY    . COLONOSCOPY WITH PROPOFOL N/A 03/01/2020   Procedure: COLONOSCOPY WITH PROPOFOL;  Surgeon: Mauri Pole, MD;  [facility];  Service: Endoscopy;  Laterality: N/A;  . COLONOSCOPY WITH PROPOFOL N/A 12/20/2020   Procedure: COLONOSCOPY WITH PROPOFOL;  Surgeon: Gatha Mayer, MD;  [facility];  Service: Endoscopy;  Laterality: N/A;  . ESOPHAGOGASTRODUODENOSCOPY (EGD) WITH PROPOFOL N/A 08/28/2019   Procedure: ESOPHAGOGASTRODUODENOSCOPY (EGD) WITH PROPOFOL;  Surgeon: Thornton Park, MD;  [facility];  Service: Gastroenterology;  Laterality: N/A;  . ESOPHAGOGASTRODUODENOSCOPY (EGD) WITH PROPOFOL N/A 03/01/2020   Procedure: ESOPHAGOGASTRODUODENOSCOPY (EGD) WITH PROPOFOL;  Surgeon: Mauri Pole, MD;  [facility];  Service: Endoscopy;  Laterality: N/A;  . ESOPHAGOGASTRODUODENOSCOPY (EGD) WITH PROPOFOL N/A 12/19/2020   Procedure: ESOPHAGOGASTRODUODENOSCOPY (EGD) WITH PROPOFOL;  Surgeon: Yetta Flock, MD;  [facility];  Service: Gastroenterology;  Laterality: N/A;  . FEMUR  IM NAIL Right 02/16/2018   Procedure: INTRAMEDULLARY (IM) NAIL RIGHT FEMORAL;  Surgeon: Paralee Cancel, MD;  [facility];  Service: Orthopedics;   Laterality: Right;  . GIVENS CAPSULE STUDY N/A 03/03/2020   Procedure: GIVENS CAPSULE STUDY;  Surgeon: Mauri Pole, MD;  [facility];  Service: Endoscopy;  Laterality: N/A;  . HAND SURGERY Right   . HEMOSTASIS CLIP PLACEMENT  12/20/2020   Procedure: HEMOSTASIS CLIP PLACEMENT;  Surgeon: Gatha Mayer, MD;  [facility];  Service: Endoscopy;;  . HOT HEMOSTASIS N/A 12/20/2020   Procedure: HOT HEMOSTASIS (ARGON PLASMA COAGULATION/BICAP);  Surgeon: Gatha Mayer, MD;  [facility];  Service: Endoscopy;  Laterality: N/A;  . IR ANGIOGRAM EXTREMITY RIGHT  02/15/2018  . IR ANGIOGRAM SELECTIVE EACH ADDITIONAL VESSEL  02/15/2018  . IR ANGIOGRAM SELECTIVE EACH ADDITIONAL VESSEL  02/15/2018  . IR ANGIOGRAM SELECTIVE EACH ADDITIONAL VESSEL  02/15/2018  . IR ANGIOGRAM SELECTIVE EACH ADDITIONAL VESSEL  02/15/2018  . IR EMBO TUMOR ORGAN ISCHEMIA INFARCT INC GUIDE ROADMAPPING  02/15/2018  . IR EMBO TUMOR ORGAN ISCHEMIA INFARCT INC GUIDE ROADMAPPING  02/15/2018  . IR US GUIDE VASC ACCESS LEFT  02/15/2018  . LUNG REMOVAL, PARTIAL Right 09/2008  . NEPHRECTOMY RADICAL     Left   . PAIN PUMP IMPLANTATION N/A 05/16/2015   Procedure: Intrathecal pain pump placement;  Surgeon: Clydell Hakim, MD;  Location: [facility];  Service: Neurosurgery;  Laterality: N/A;  Intrathecal pain pump placement    Allergies: Ceftriaxone and Hydrocodone  Medications: Prior to Admission medications   Medication Sig Start Date End Date Taking? Authorizing Provider  acetaminophen (TYLENOL) 500 MG tablet Take 500 mg by mouth every 6 (six) hours as needed for moderate pain or mild pain.   Yes [provider]  ALPRAZolam (XANAX) 1 MG tablet TAKE 1 TABLET BY MOUTH THREE TIMES A DAY AS NEEDED FOR ANXIETY Patient taking differently: Take 1 mg by mouth 2 (two) times daily. 03/13/21  Yes [name], MD  axitinib (INLYTA) 5 MG tablet TAKE 1 TABLET (5 MG TOTAL) BY MOUTH 2 (TWO) TIMES  DAILY. Patient taking differently: Take 5 mg by mouth 2 (two) times daily. 03/19/21 03/19/22 Yes [name], MD  carvedilol (COREG) 12.5 MG tablet Take 1 tablet (12.5 mg total) by mouth 2 (two) times daily with a meal. 02/27/21  Yes [name], [name] Freer, MD  diphenhydrAMINE (BENADRYL) 25 MG tablet Take 25 mg by mouth daily.   Yes [provider]  ferrous sulfate 325 (65 FE) MG tablet TAKE 1 TABLET BY MOUTH EVERY DAY WITH BREAKFAST Patient taking differently: Take 325 mg by mouth daily. 10/24/20  Yes Zehr, Laban Emperor, PA-C  furosemide (LASIX) 20 MG tablet Take 1 tablet (20 mg total) by mouth daily. 02/27/21  Yes [name], [name] Freer, MD  gabapentin (NEURONTIN) 300 MG capsule Take 300 mg by mouth at bedtime.   Yes [provider]  Glycerin-Hypromellose-PEG 400 ([employer] DRY EYE RELIEF) 0.2-0.2-1 % SOLN Place 1 drop into both eyes daily as needed (Dry eye).   Yes [provider]  hydrALAZINE (APRESOLINE) 25 MG tablet Take 1 tablet (25 mg total) by mouth 3 (three) times daily. 02/27/21 05/28/21 Yes [name], [name] Freer, MD  HYDROmorphone (DILAUDID) 8 MG tablet Take 8 mg by mouth 3 (three) times daily.   Yes [provider]  Magnesium Oxide 400 MG CAPS Take 1 capsule (400 mg total) by mouth daily. 02/06/21  Yes Shelly Coss, MD  ondansetron (  ZOFRAN) 4 MG tablet Take one tablet every morning.  Can take one tablet later in the day on an as needed basis Patient taking differently: Take one tablet every morning.  Can take one tablet later in the day on an as needed basis 10/10/20  Yes [name], MD  PAIN MANAGEMENT INTRATHECAL, IT, PUMP 1 each by Intrathecal route continuous. Intrathecal (IT) medication:   Morphine.  Can push every 4 hours   Yes [provider]  pantoprazole (PROTONIX) 40 MG tablet Take 1 tablet (40 mg total) by mouth daily. 12/21/20  Yes Oswald Hillock, MD  tiZANidine (ZANAFLEX) 4 MG tablet Take 4 mg by mouth 3 (three) times daily.   Yes [provider]  Cyanocobalamin (B-12) 1000 MCG TABS Take 1 tablet (1,000 mcg total) by mouth daily. Patient not taking: Reported on 03/27/2021 12/31/20   Willia Craze, NP     Family History  Problem Relation Age of Onset  . Hypertension Mother   . Diabetes Brother   . Irritable bowel syndrome Sister   . Cancer Maternal Aunt        ovarian  . Colon cancer Neg Hx     Social History   Socioeconomic History  . Marital status: Married    Spouse name: [name]  . Number of children: 2  . Years of education: Not on file  . Highest education level: Not on file  Occupational History  . Occupation: Clinical biochemist   Tobacco Use  . Smoking status: Current Every Day Smoker    Packs/day: 2.00    Years: 30.00    Pack years: 60.00    Types: Cigarettes  . Smokeless tobacco: Never Used  Vaping Use  . Vaping Use: Never used  Substance and Sexual Activity  . Alcohol use: No  . Drug use: Not Currently    Types: Marijuana    Comment: occasional. Last used: last night.   . Sexual activity: Not Currently  Other Topics Concern  . Not on file  Social History Narrative   6 caffeine drinks daily    08-11-18  Unable to ask abuse questions wife with him today.   Social Determinants of Health   Financial Resource Strain: Not on file  Food Insecurity: No Food Insecurity  . Worried About Charity fundraiser in the Last Year: Never true  . Ran Out of Food in the Last Year: Never true  Transportation Needs: No Transportation Needs  . Lack of Transportation (Medical): No  . Lack of Transportation (Non-Medical): No  Physical Activity: Not on file  Stress: Not on file  Social Connections: Not on file     Review of Systems: A 12 point ROS discussed and pertinent positives are indicated in the HPI above.  All other systems are negative.  Review of Systems  Constitutional: Positive for activity change. Negative for fever.  Respiratory: Negative for cough and shortness of breath.    Cardiovascular: Negative for chest pain.  Musculoskeletal: Positive for back pain.  Psychiatric/Behavioral: Negative for behavioral problems and confusion.    Vital Signs: BP (!) 156/98   Pulse 70   Temp 97.7 F (36.5 C) (Oral)   Ht 6' 3"  (1.905 m)   Wt 165 lb (74.8 kg)   SpO2 100%   BMI 20.62 kg/m   Physical Exam Vitals reviewed.  HENT:     Mouth/Throat:     Mouth: Mucous membranes are moist.  Cardiovascular:     Rate and Rhythm: Normal rate  and regular rhythm.     Heart sounds: Normal heart sounds.  Pulmonary:     Effort: Pulmonary effort is normal.     Breath sounds: Normal breath sounds.  Abdominal:     Palpations: Abdomen is soft.  Musculoskeletal:     Comments: Left arm in sling apparatus Painful to move arm   Left wrist pulses: 2+  Skin:    General: Skin is warm.  Neurological:     Mental Status: He is alert and oriented to person, place, and time.  Psychiatric:        Behavior: Behavior normal.     Imaging: DG Shoulder Left  Result Date: 03/20/2021 CLINICAL DATA:  Fall, left arm pain EXAM: LEFT SHOULDER - 2+ VIEW COMPARISON:  None. FINDINGS: There is a a lytic lesion involving the a surgical neck of the humerus complicated by a pathologic fracture with 2-3 cortical with medial displacement of the distal fracture fragment comprising the humeral shaft. Given the findings noted on recent CT examination of 01/31/2021, this likely represents a a metastatic focus of the patient's underlying renal cell carcinoma. The humeral head is still well seated within the glenoid fossa. Acromioclavicular joint space is preserved. Glenohumeral joint space is preserved. Limited evaluation of the left hemithorax is unremarkable. IMPRESSION: Lytic lesion within the a proximal left humerus complicated by pathologic fracture involving the surgical neck of the humerus with fracture fragments in near anatomic alignment. Electronically Signed   By: Fidela Salisbury MD   On: 03/20/2021  22:40    Labs:  CBC: Recent Labs    02/27/21 1102 02/27/21 1941 03/20/21 1435 04/01/21 0739  WBC 7.1 8.0 10.7* 6.9  HGB 10.5* 9.9* 10.0* 10.7*  HCT 33.7* 34.3* 33.9* 35.7*  PLT 286 238 293 278    COAGS: Recent Labs    12/18/20 1630 04/01/21 0739  INR 2.2* 1.1  APTT 45* 28    BMP: Recent Labs    04/24/20 0754 06/19/20 0758 06/30/20 1709 09/21/20 0854 11/20/20 1120 02/18/21 1003 02/27/21 1102 02/27/21 1941 03/20/21 1435 04/01/21 0739  NA 143 141 142 138   < > 138 138 139 140 133*  K 4.5 4.1 4.0 4.6   < > 4.9 6.5* 5.5* 4.6 6.2*  CL 114* 109 106 112*   < > 106 104 104 101 98  CO2 24 25 28 23    < > 24 23 28 29 28   GLUCOSE 106* 90 87 106*   < > 104* 94 144* 85 118*  BUN 19 16 14 18    < > 23* 28* 32* 16 26*  CALCIUM 7.9* 8.1* 8.1* 7.8*   < > 9.2 8.9 9.0 9.1 10.5*  CREATININE 1.30* 1.31* 1.41* 1.45*   < > 1.52* 1.46* 1.62* 1.45* 1.50*  GFRNONAA >60 >60 56* 55*   < > 54*  --  50* 58* 55*  GFRAA >60 >60 >60 >60  --   --   --   --   --   --    < > = values in this interval not displayed.    LIVER FUNCTION TESTS: Recent Labs    02/15/21 1215 02/18/21 1003 02/27/21 1941 03/20/21 1435  BILITOT 0.4 0.3 0.4 0.3  AST 23 17 17 16   ALT 36 25 18 25   ALKPHOS 102 109 98 135*  PROT 6.9 6.7 7.7 8.0  ALBUMIN 2.6* 2.6* 3.2* 2.9*    TUMOR MARKERS: No results for input(s): AFPTM, CEA, CA199, CHROMGRNA in the last 8760 hours.  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 Risks and benefits of left upper extremity arteriogram with embolization were discussed with the patient including, but not limited to bleeding, infection, vascular injury or contrast induced renal failure.  This interventional procedure involves the use of X-rays and because of the nature of the  planned procedure, it is possible that we will have prolonged use of X-ray fluoroscopy.  Potential radiation risks to you include (but are not limited to) the following: - A slightly elevated risk for cancer  several years later in life. This risk is typically less than 0.5% percent. This risk is low in comparison to the normal incidence of human cancer, which is 33% for women and 50% for men according to the [place]. - Radiation induced injury can include skin redness, resembling a rash, tissue breakdown / ulcers and hair loss (which can be temporary or permanent).   The likelihood of either of these occurring depends on the difficulty of the procedure and whether you are sensitive to radiation due to previous procedures, disease, or genetic conditions.   IF your procedure requires a prolonged use of radiation, you will be notified and given written instructions for further action.  It is your responsibility to monitor the irradiated area for the 2 weeks following the procedure and to notify your physician if you are concerned that you have suffered a radiation induced injury.    All of the patient's questions were answered, patient is agreeable to proceed.  Consent signed and in chart  Thank you for this interesting consult.  I greatly enjoyed meeting ROEMELLO SPEYER and look forward to participating in their care.  A copy of this report was sent to the requesting provider on this date.  Electronically Signed: Lavonia Drafts, PA-C 04/01/2021, 8:57 AM   I spent a total of  30 Minutes   in face to face in clinical consultation, greater than 50% of which was counseling/coordinating care for left upper extremity arteriogram with embolization

## 2021-04-02 ENCOUNTER — Telehealth (HOSPITAL_COMMUNITY): Payer: Self-pay | Admitting: Radiology

## 2021-04-02 DIAGNOSIS — E875 Hyperkalemia: Secondary | ICD-10-CM | POA: Diagnosis not present

## 2021-04-02 LAB — POCT I-STAT, CHEM 8
BUN: 30 mg/dL — ABNORMAL HIGH (ref 6–20)
Calcium, Ion: 1.42 mmol/L — ABNORMAL HIGH (ref 1.15–1.40)
Chloride: 100 mmol/L (ref 98–111)
Creatinine, Ser: 1.6 mg/dL — ABNORMAL HIGH (ref 0.61–1.24)
Glucose, Bld: 99 mg/dL (ref 70–99)
HCT: 35 % — ABNORMAL LOW (ref 39.0–52.0)
Hemoglobin: 11.9 g/dL — ABNORMAL LOW (ref 13.0–17.0)
Potassium: 6.8 mmol/L (ref 3.5–5.1)
Sodium: 135 mmol/L (ref 135–145)
TCO2: 28 mmol/L (ref 22–32)

## 2021-04-02 NOTE — Telephone Encounter (Signed)
Unable to reach patient by phone. Left VM with instructions for his procedure at [facility] with Dr. Laurence Ferrari on 4/14 prior to surgery the same day with Dr. Stann Mainland. Pt will need to arrive at [facility] at [facility] at 12pm, NPO, may take am meds, driver. JM

## 2021-04-03 ENCOUNTER — Other Ambulatory Visit: Payer: Self-pay | Admitting: Student

## 2021-04-03 ENCOUNTER — Encounter (HOSPITAL_COMMUNITY): Payer: Self-pay | Admitting: Orthopedic Surgery

## 2021-04-03 ENCOUNTER — Other Ambulatory Visit: Payer: Self-pay | Admitting: Oncology

## 2021-04-03 ENCOUNTER — Other Ambulatory Visit: Payer: Self-pay | Admitting: Radiology

## 2021-04-03 ENCOUNTER — Other Ambulatory Visit: Payer: Self-pay | Admitting: *Deleted

## 2021-04-03 DIAGNOSIS — R269 Unspecified abnormalities of gait and mobility: Secondary | ICD-10-CM | POA: Insufficient documentation

## 2021-04-03 DIAGNOSIS — E875 Hyperkalemia: Secondary | ICD-10-CM | POA: Diagnosis not present

## 2021-04-03 NOTE — Progress Notes (Signed)
Patient denies shortness of breath, fever, cough or chest pain.  PCP - Dr [name] Cardiologist - Dr Kyra Manges - Dr Owens Loffler  Chest x-ray - 02/16/21 (1V) EKG - 02/28/21 Stress Test - n/a ECHO - 02/01/21 Cardiac Cath - n/a  Diabetes - Type 2, diet controlled, no meds. Fasting Blood Sugar - unknowjn Checks Blood Sugar - does not check  Last use of marijuana was on Sunday, 03/31/21.  ERAS: Clear liquids til 12:30 pm on DOS.  Anesthesia review: Yes  STOP now taking any Aspirin (unless otherwise instructed by your surgeon), Aleve, Naproxen, Ibuprofen, Motrin, Advil, Goody's, BC's, all herbal medications, fish oil, and all vitamins.   Coronavirus Screening Covid test on 03/29/21 was negative.  Patient verbalized understanding of instructions that were given via phone.

## 2021-04-03 NOTE — Patient Outreach (Signed)
Keith [name] [facility]) Care Management  04/03/2021  Keith [name] 03-16-1966 [id number]   [facility] Unsuccessful outreach with noted admission  Referral Date: 02/06/21 Referral Source: [facility] hospital liaison Referral Reason: assessments of community needs for care/disease management  Home health was not preferred at hospital discharge Insurance: [place] care [facility])  Last admission  04/01/21 admitted for surgery for left shoulder - for pre-operative embolization of left proximal humeral met for perioperative blood loss reduction ED visits 03/20/21 ED - left shoulder injury status post (s/p) mechanical fall walking his dog outside Dx lytic lesion within the proximal left humerus complicated by pathologic fracture involving the surgical neck of the humerus with fracture fragments in near anatomic alignment- sent home with pain medicine/current on morphine pump-followed by Dr Keith [name]- 02/28/21 -critical potassium of 6.5-hyperkalemia-, he was being overmedicated with potassium along with also being on losartan and Aldactone.  These medications have since been stopped.  01/31/21-02/06/21 for shortness of breath (sob) Dx acute hypoxic respiratory failure related to pulmonary embolism, acute combined systolic/diastolic congestive Heart Failure (CHF)  Outreach attempt to the home number  No answer. [facility] RN CM unable to leave a voice message  Noted admission on 04/01/21 04/01/21 for surgery for Plan for pre-operative embolization of left proximal humeral met for perioperative blood loss reduction   Stage  IV renal cell carcinoma with pulmonary and bone involvement---2009 followed by Dr Keith [name] Past Medical History:  Diagnosis Date  . Anemia   . Anxiety   . CHF (congestive heart failure) ([place])   . Diabetes mellitus without complication ([place])    diet controlled only - wife denies this  . GI bleed   . Hypertension   . left renal ca d'd 2008  . Malignant neoplasm metastatic to pancreas ([place])  2012  . 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 Metastasis to bone ([place]) 2014/2016  . Pulmonary embolus [facility])     Plan: [facility] RN CM scheduled this patient for another call attempt within 4-7 business days  Keith Idler L. Lavina Hamman, RN, BSN, [place] Coordinator Office number [phone] Mobile number [phone]  Main [facility] number [id number] Fax number [phone]

## 2021-04-04 ENCOUNTER — Ambulatory Visit (HOSPITAL_COMMUNITY)
Admission: RE | Admit: 2021-04-04 | Discharge: 2021-04-04 | Disposition: A | Discharge status: Home / Self Care | Payer: Medicare Other | Attending: Orthopedic Surgery | Admitting: Orthopedic Surgery

## 2021-04-04 ENCOUNTER — Other Ambulatory Visit: Payer: Self-pay

## 2021-04-04 ENCOUNTER — Ambulatory Visit (HOSPITAL_COMMUNITY): Payer: Medicare Other | Admitting: Physician Assistant

## 2021-04-04 ENCOUNTER — Ambulatory Visit (HOSPITAL_COMMUNITY): Payer: Medicare Other

## 2021-04-04 ENCOUNTER — Ambulatory Visit (HOSPITAL_COMMUNITY): Admission: RE | Admit: 2021-04-04 | Payer: Medicare Other | Source: Home / Self Care | Admitting: Orthopedic Surgery

## 2021-04-04 ENCOUNTER — Telehealth (HOSPITAL_COMMUNITY): Payer: Self-pay

## 2021-04-04 ENCOUNTER — Other Ambulatory Visit (HOSPITAL_COMMUNITY): Payer: Self-pay

## 2021-04-04 ENCOUNTER — Encounter (HOSPITAL_COMMUNITY)
Admission: RE | Disposition: A | Discharge status: Home / Self Care | Payer: Self-pay | Source: Home / Self Care | Attending: Orthopedic Surgery

## 2021-04-04 ENCOUNTER — Encounter (HOSPITAL_COMMUNITY): Payer: Self-pay | Admitting: Orthopedic Surgery

## 2021-04-04 DIAGNOSIS — I509 Heart failure, unspecified: Secondary | ICD-10-CM | POA: Insufficient documentation

## 2021-04-04 DIAGNOSIS — Z79899 Other long term (current) drug therapy: Secondary | ICD-10-CM | POA: Diagnosis not present

## 2021-04-04 DIAGNOSIS — I110 Hypertensive heart disease with heart failure: Secondary | ICD-10-CM | POA: Diagnosis not present

## 2021-04-04 DIAGNOSIS — Z885 Allergy status to narcotic agent status: Secondary | ICD-10-CM | POA: Insufficient documentation

## 2021-04-04 DIAGNOSIS — Z905 Acquired absence of kidney: Secondary | ICD-10-CM | POA: Diagnosis not present

## 2021-04-04 DIAGNOSIS — Z86711 Personal history of pulmonary embolism: Secondary | ICD-10-CM | POA: Diagnosis not present

## 2021-04-04 DIAGNOSIS — C7951 Secondary malignant neoplasm of bone: Secondary | ICD-10-CM | POA: Diagnosis not present

## 2021-04-04 DIAGNOSIS — M84421A Pathological fracture, right humerus, initial encounter for fracture: Secondary | ICD-10-CM | POA: Diagnosis not present

## 2021-04-04 DIAGNOSIS — G8918 Other acute postprocedural pain: Secondary | ICD-10-CM | POA: Diagnosis not present

## 2021-04-04 DIAGNOSIS — W010XXA Fall on same level from slipping, tripping and stumbling without subsequent striking against object, initial encounter: Secondary | ICD-10-CM | POA: Diagnosis not present

## 2021-04-04 DIAGNOSIS — M84422A Pathological fracture, left humerus, initial encounter for fracture: Secondary | ICD-10-CM | POA: Insufficient documentation

## 2021-04-04 DIAGNOSIS — Z20822 Contact with and (suspected) exposure to covid-19: Secondary | ICD-10-CM | POA: Diagnosis not present

## 2021-04-04 DIAGNOSIS — I5042 Chronic combined systolic (congestive) and diastolic (congestive) heart failure: Secondary | ICD-10-CM | POA: Diagnosis not present

## 2021-04-04 DIAGNOSIS — M84522A Pathological fracture in neoplastic disease, left humerus, initial encounter for fracture: Secondary | ICD-10-CM | POA: Diagnosis not present

## 2021-04-04 DIAGNOSIS — Z881 Allergy status to other antibiotic agents status: Secondary | ICD-10-CM | POA: Diagnosis not present

## 2021-04-04 DIAGNOSIS — C649 Malignant neoplasm of unspecified kidney, except renal pelvis: Secondary | ICD-10-CM | POA: Diagnosis not present

## 2021-04-04 DIAGNOSIS — C642 Malignant neoplasm of left kidney, except renal pelvis: Secondary | ICD-10-CM | POA: Diagnosis not present

## 2021-04-04 DIAGNOSIS — Z419 Encounter for procedure for purposes other than remedying health state, unspecified: Secondary | ICD-10-CM

## 2021-04-04 HISTORY — PX: HUMERUS IM NAIL: SHX1769

## 2021-04-04 HISTORY — PX: IR EMBO TUMOR ORGAN ISCHEMIA INFARCT INC GUIDE ROADMAPPING: IMG5449

## 2021-04-04 HISTORY — DX: Anemia, unspecified: D64.9

## 2021-04-04 HISTORY — PX: IR ANGIOGRAM EXTREMITY LEFT: IMG651

## 2021-04-04 HISTORY — PX: IR ANGIOGRAM SELECTIVE EACH ADDITIONAL VESSEL: IMG667

## 2021-04-04 HISTORY — DX: Heart failure, unspecified: I50.9

## 2021-04-04 HISTORY — PX: IR US GUIDE VASC ACCESS RIGHT: IMG2390

## 2021-04-04 HISTORY — DX: Other pulmonary embolism without acute cor pulmonale: I26.99

## 2021-04-04 LAB — BASIC METABOLIC PANEL
Anion gap: 6 (ref 5–15)
BUN: 23 mg/dL — ABNORMAL HIGH (ref 6–20)
CO2: 30 mmol/L (ref 22–32)
Calcium: 9.1 mg/dL (ref 8.9–10.3)
Chloride: 99 mmol/L (ref 98–111)
Creatinine, Ser: 1.4 mg/dL — ABNORMAL HIGH (ref 0.61–1.24)
GFR, Estimated: >60 mL/min (ref >60)
Glucose, Bld: 88 mg/dL (ref 70–99)
Potassium: 4.4 mmol/L (ref 3.5–5.1)
Sodium: 135 mmol/L (ref 135–145)

## 2021-04-04 LAB — GLUCOSE, CAPILLARY: Glucose-Capillary: 99 mg/dL (ref 70–99)

## 2021-04-04 LAB — SARS CORONAVIRUS 2 BY RT PCR (HOSPITAL ORDER, PERFORMED IN ~~LOC~~ HOSPITAL LAB): SARS Coronavirus 2: NEGATIVE

## 2021-04-04 SURGERY — INSERTION, INTRAMEDULLARY ROD, HUMERUS
Anesthesia: General | Site: Arm Upper | Laterality: Left

## 2021-04-04 MED ORDER — HEPARIN SODIUM (PORCINE) 1000 UNIT/ML IJ SOLN
INTRAMUSCULAR | Status: AC | PRN
  Administered 2021-04-04: 3000 [IU] via INTRAVENOUS

## 2021-04-04 MED ORDER — MIDAZOLAM HCL 2 MG/2ML IJ SOLN
INTRAMUSCULAR | Status: AC
  Administered 2021-04-04: 1 mg via INTRAVENOUS
  Filled 2021-04-04: qty 2

## 2021-04-04 MED ORDER — FENTANYL CITRATE (PF) 100 MCG/2ML IJ SOLN
INTRAMUSCULAR | Status: DC | PRN
  Administered 2021-04-04: 100 ug via INTRAVENOUS
  Administered 2021-04-04: 50 ug via INTRAVENOUS

## 2021-04-04 MED ORDER — MIDAZOLAM HCL 2 MG/2ML IJ SOLN: 1.0000 mg | Freq: Once | INTRAMUSCULAR | Status: AC

## 2021-04-04 MED ORDER — ONDANSETRON HCL 4 MG/2ML IJ SOLN
INTRAMUSCULAR | Status: AC
  Filled 2021-04-04: qty 2

## 2021-04-04 MED ORDER — FENTANYL CITRATE (PF) 100 MCG/2ML IJ SOLN
25.0000 ug | INTRAMUSCULAR | Status: DC | PRN
  Administered 2021-04-04 (×2): 50 ug via INTRAVENOUS

## 2021-04-04 MED ORDER — CHLORHEXIDINE GLUCONATE 0.12 % MT SOLN
15.0000 mL | Freq: Once | OROMUCOSAL | Status: AC
  Administered 2021-04-04: 15 mL via OROMUCOSAL

## 2021-04-04 MED ORDER — OXYCODONE HCL 5 MG/5ML PO SOLN: 5.0000 mg | Freq: Once | ORAL | Status: DC | PRN

## 2021-04-04 MED ORDER — LIDOCAINE 2% (20 MG/ML) 5 ML SYRINGE
INTRAMUSCULAR | Status: DC | PRN
  Administered 2021-04-04: 40 mg via INTRAVENOUS

## 2021-04-04 MED ORDER — CEFAZOLIN SODIUM-DEXTROSE 2-4 GM/100ML-% IV SOLN
2.0000 g | INTRAVENOUS | Status: AC
  Administered 2021-04-04: 2 g via INTRAVENOUS

## 2021-04-04 MED ORDER — ROCURONIUM BROMIDE 10 MG/ML (PF) SYRINGE
PREFILLED_SYRINGE | INTRAVENOUS | Status: DC | PRN
  Administered 2021-04-04: 60 mg via INTRAVENOUS

## 2021-04-04 MED ORDER — MIDAZOLAM HCL 2 MG/2ML IJ SOLN
INTRAMUSCULAR | Status: AC | PRN
  Administered 2021-04-04: 1 mg via INTRAVENOUS
  Administered 2021-04-04: 0.5 mg via INTRAVENOUS

## 2021-04-04 MED ORDER — DEXAMETHASONE SODIUM PHOSPHATE 10 MG/ML IJ SOLN
INTRAMUSCULAR | Status: AC
  Filled 2021-04-04: qty 1

## 2021-04-04 MED ORDER — BUPIVACAINE HCL (PF) 0.5 % IJ SOLN
INTRAMUSCULAR | Status: DC | PRN
  Administered 2021-04-04: 15 mL via PERINEURAL

## 2021-04-04 MED ORDER — ONDANSETRON HCL 4 MG/2ML IJ SOLN: 4.0000 mg | Freq: Once | INTRAMUSCULAR | Status: DC | PRN

## 2021-04-04 MED ORDER — IODIXANOL 320 MG/ML IV SOLN
100.0000 mL | Freq: Once | INTRAVENOUS | Status: AC | PRN
  Administered 2021-04-04: 88 mL via INTRA_ARTERIAL

## 2021-04-04 MED ORDER — PROPOFOL 10 MG/ML IV BOLUS
INTRAVENOUS | Status: DC | PRN
  Administered 2021-04-04: 120 mg via INTRAVENOUS

## 2021-04-04 MED ORDER — ONDANSETRON HCL 4 MG/2ML IJ SOLN
INTRAMUSCULAR | Status: DC | PRN
  Administered 2021-04-04: 4 mg via INTRAVENOUS

## 2021-04-04 MED ORDER — LIDOCAINE HCL (PF) 1 % IJ SOLN
INTRAMUSCULAR | Status: AC
  Filled 2021-04-04: qty 30

## 2021-04-04 MED ORDER — BUPIVACAINE LIPOSOME 1.3 % IJ SUSP
INTRAMUSCULAR | Status: DC | PRN
  Administered 2021-04-04: 10 mL

## 2021-04-04 MED ORDER — FENTANYL CITRATE (PF) 250 MCG/5ML IJ SOLN
INTRAMUSCULAR | Status: AC
  Filled 2021-04-04: qty 5

## 2021-04-04 MED ORDER — SUGAMMADEX SODIUM 200 MG/2ML IV SOLN
INTRAVENOUS | Status: DC | PRN
  Administered 2021-04-04: 200 mg via INTRAVENOUS

## 2021-04-04 MED ORDER — TRANEXAMIC ACID-NACL 1000-0.7 MG/100ML-% IV SOLN
1000.0000 mg | INTRAVENOUS | Status: AC
  Administered 2021-04-04: 1000 mg via INTRAVENOUS

## 2021-04-04 MED ORDER — FENTANYL CITRATE (PF) 100 MCG/2ML IJ SOLN
INTRAMUSCULAR | Status: AC | PRN
  Administered 2021-04-04: 25 ug via INTRAVENOUS
  Administered 2021-04-04: 50 ug via INTRAVENOUS

## 2021-04-04 MED ORDER — FENTANYL CITRATE (PF) 100 MCG/2ML IJ SOLN: 50.0000 ug | Freq: Once | INTRAMUSCULAR | Status: AC

## 2021-04-04 MED ORDER — ORAL CARE MOUTH RINSE: 15.0000 mL | Freq: Once | OROMUCOSAL | Status: AC

## 2021-04-04 MED ORDER — AMISULPRIDE (ANTIEMETIC) 5 MG/2ML IV SOLN: 10.0000 mg | Freq: Once | INTRAVENOUS | Status: DC | PRN

## 2021-04-04 MED ORDER — FENTANYL CITRATE (PF) 100 MCG/2ML IJ SOLN
INTRAMUSCULAR | Status: AC
  Administered 2021-04-04: 50 ug via INTRAVENOUS
  Filled 2021-04-04: qty 2

## 2021-04-04 MED ORDER — DEXAMETHASONE SODIUM PHOSPHATE 10 MG/ML IJ SOLN
INTRAMUSCULAR | Status: DC | PRN
  Administered 2021-04-04: 5 mg via INTRAVENOUS

## 2021-04-04 MED ORDER — LACTATED RINGERS IV SOLN: INTRAVENOUS | Status: DC

## 2021-04-04 MED ORDER — 0.9 % SODIUM CHLORIDE (POUR BTL) OPTIME
TOPICAL | Status: DC | PRN
  Administered 2021-04-04: 1000 mL

## 2021-04-04 MED ORDER — SODIUM CHLORIDE 0.9 % IV SOLN: INTRAVENOUS | Status: DC

## 2021-04-04 MED ORDER — OXYCODONE HCL 5 MG PO TABS: 5.0000 mg | ORAL_TABLET | Freq: Once | ORAL | Status: DC | PRN

## 2021-04-04 SURGICAL SUPPLY — 58 items
APL PRP STRL LF DISP 70% ISPRP (MISCELLANEOUS) ×2
BIT DRILL 10 HOLLOW (BIT) ×1 IMPLANT
BIT DRILL 3.8X270 (BIT) ×1 IMPLANT
BIT DRILL SHORT 3.2MM (DRILL) IMPLANT
CHLORAPREP W/TINT 26 (MISCELLANEOUS) ×3 IMPLANT
COVER SURGICAL LIGHT HANDLE (MISCELLANEOUS) ×2 IMPLANT
DRAPE C-ARM 42X72 X-RAY (DRAPES) ×1 IMPLANT
DRAPE INCISE IOBAN 66X45 STRL (DRAPES) ×2 IMPLANT
DRAPE ORTHO SPLIT 77X108 STRL (DRAPES) ×4
DRAPE SURG ORHT 6 SPLT 77X108 (DRAPES) ×2 IMPLANT
DRAPE U-SHAPE 47X51 STRL (DRAPES) ×2 IMPLANT
DRILL SHORT 3.2MM (DRILL) ×2
DRSG ADAPTIC 3X8 NADH LF (GAUZE/BANDAGES/DRESSINGS) ×2 IMPLANT
DRSG PAD ABDOMINAL 8X10 ST (GAUZE/BANDAGES/DRESSINGS) ×1 IMPLANT
ELECT CAUTERY BLADE 6.4 (BLADE) ×1 IMPLANT
ELECT REM PT RETURN 9FT ADLT (ELECTROSURGICAL) ×2
ELECTRODE REM PT RTRN 9FT ADLT (ELECTROSURGICAL) ×1 IMPLANT
GAUZE SPONGE 4X4 12PLY STRL LF (GAUZE/BANDAGES/DRESSINGS) ×1 IMPLANT
GLOVE BIO SURGEON STRL SZ7.5 (GLOVE) ×4 IMPLANT
GLOVE SRG 8 PF TXTR STRL LF DI (GLOVE) ×2 IMPLANT
GLOVE SURG UNDER POLY LF SZ8 (GLOVE) ×4
GOWN STRL REUS W/ TWL LRG LVL3 (GOWN DISPOSABLE) ×1 IMPLANT
GOWN STRL REUS W/ TWL XL LVL3 (GOWN DISPOSABLE) ×2 IMPLANT
GOWN STRL REUS W/TWL LRG LVL3 (GOWN DISPOSABLE) ×2
GOWN STRL REUS W/TWL XL LVL3 (GOWN DISPOSABLE) ×4
GUIDEROD SS 2.5MMX280MM (ROD) ×1 IMPLANT
KIT BASIN OR (CUSTOM PROCEDURE TRAY) ×2 IMPLANT
KIT TURNOVER KIT B (KITS) ×2 IMPLANT
MANIFOLD NEPTUNE II (INSTRUMENTS) ×2 IMPLANT
NAIL HUM MULTILOC 7X270 LG (Nail) ×1 IMPLANT
NS IRRIG 1000ML POUR BTL (IV SOLUTION) ×2 IMPLANT
PACK SHOULDER (CUSTOM PROCEDURE TRAY) ×2 IMPLANT
PAD ARMBOARD 7.5X6 YLW CONV (MISCELLANEOUS) ×4 IMPLANT
ROD REAMING 2.5 (INSTRUMENTS) ×1 IMPLANT
SCREW LOCK 4.5X40 (Screw) ×2 IMPLANT
SCREW LOCK 4X30 TI (Screw) ×1 IMPLANT
SCREW LOCK FT 40X4.5X3.9XSLF (Screw) IMPLANT
SCREW TI MULTILOC 4.5X42 (Screw) ×2 IMPLANT
SLING ARM FOAM STRAP LRG (SOFTGOODS) IMPLANT
SLING ARM FOAM STRAP MED (SOFTGOODS) IMPLANT
SPONGE LAP 4X18 RFD (DISPOSABLE) ×1 IMPLANT
STAPLER VISISTAT 35W (STAPLE) ×1 IMPLANT
STRIP CLOSURE SKIN 1/2X4 (GAUZE/BANDAGES/DRESSINGS) IMPLANT
SUCTION FRAZIER HANDLE 10FR (MISCELLANEOUS) ×2
SUCTION TUBE FRAZIER 10FR DISP (MISCELLANEOUS) ×1 IMPLANT
SUT FIBERWIRE #2 38 T-5 BLUE (SUTURE)
SUT MNCRL AB 4-0 PS2 18 (SUTURE) IMPLANT
SUT VIC AB 1 CT1 27 (SUTURE) ×2
SUT VIC AB 1 CT1 27XBRD ANBCTR (SUTURE) IMPLANT
SUT VIC AB 2-0 CT1 27 (SUTURE) ×2
SUT VIC AB 2-0 CT1 TAPERPNT 27 (SUTURE) ×1 IMPLANT
SUTURE FIBERWR #2 38 T-5 BLUE (SUTURE) IMPLANT
SYR CONTROL 10ML LL (SYRINGE) IMPLANT
TAPE CLOTH SURG 6X10 WHT LF (GAUZE/BANDAGES/DRESSINGS) ×1 IMPLANT
TOWEL GREEN STERILE (TOWEL DISPOSABLE) ×2 IMPLANT
TOWEL GREEN STERILE FF (TOWEL DISPOSABLE) ×2 IMPLANT
WATER STERILE IRR 1000ML POUR (IV SOLUTION) ×1 IMPLANT
YANKAUER SUCT BULB TIP NO VENT (SUCTIONS) ×2 IMPLANT

## 2021-04-04 NOTE — H&P (Signed)
ORTHOPAEDIC H and P  REQUESTING PHYSICIAN: Keith Stairs, MD  PCP:  Keith Pretty, MD  Chief Complaint: Left proximal humerus pathologic fracture  HPI: Keith [name] is a 55 y.o. male who complains of left shoulder pain following a low energy trauma to the left shoulder.  This resulted in a pathologic fracture to the proximal humerus.  He has metastatic renal cell carcinoma.  Here today for internal fixation with IM nail following preoperative interventional radiology embolization.  Past Medical History:  Diagnosis Date  . Anemia   . Anxiety   . CHF (congestive heart failure) ([place])   . Diabetes mellitus without complication ([place])    diet controlled only - wife denies this  . GI bleed   . Hypertension   . left renal ca d'd 2008  . Malignant neoplasm metastatic to pancreas ([place]) 2012  . 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 Metastasis to bone ([place]) 2014/2016  . Pulmonary embolus [facility])    Past Surgical History:  Procedure Laterality Date  . BIOPSY  08/28/2019   Procedure: BIOPSY;  Surgeon: Thornton Park, MD;  Location: [facility];  Service: Gastroenterology;;  . BIOPSY  03/01/2020   Procedure: BIOPSY;  Surgeon: Mauri Pole, MD;  Location: [facility];  Service: Endoscopy;;  . BIOPSY  12/19/2020   Procedure: BIOPSY;  Surgeon: Yetta Flock, MD;  Location: [facility];  Service: Gastroenterology;;  . BIOPSY  12/20/2020   Procedure: BIOPSY;  Surgeon: Gatha Mayer, MD;  Location: [facility];  Service: Endoscopy;;  . COLONOSCOPY W/ POLYPECTOMY    . COLONOSCOPY WITH PROPOFOL N/A 03/01/2020   Procedure: COLONOSCOPY WITH PROPOFOL;  Surgeon: Mauri Pole, MD;  Location: [facility];  Service: Endoscopy;  Laterality: N/A;  . COLONOSCOPY WITH PROPOFOL N/A 12/20/2020   Procedure: COLONOSCOPY WITH PROPOFOL;  Surgeon: Gatha Mayer, MD;  Location: [facility];  Service: Endoscopy;  Laterality: N/A;  . ESOPHAGOGASTRODUODENOSCOPY (EGD) WITH PROPOFOL N/A 08/28/2019    Procedure: ESOPHAGOGASTRODUODENOSCOPY (EGD) WITH PROPOFOL;  Surgeon: Thornton Park, MD;  [facility];  Service: Gastroenterology;  Laterality: N/A;  . ESOPHAGOGASTRODUODENOSCOPY (EGD) WITH PROPOFOL N/A 03/01/2020   Procedure: ESOPHAGOGASTRODUODENOSCOPY (EGD) WITH PROPOFOL;  Surgeon: Mauri Pole, MD;  [facility];  Service: Endoscopy;  Laterality: N/A;  . ESOPHAGOGASTRODUODENOSCOPY (EGD) WITH PROPOFOL N/A 12/19/2020   Procedure: ESOPHAGOGASTRODUODENOSCOPY (EGD) WITH PROPOFOL;  Surgeon: Yetta Flock, MD;  [facility];  Service: Gastroenterology;  Laterality: N/A;  . FEMUR IM NAIL Right 02/16/2018   Procedure: INTRAMEDULLARY (IM) NAIL RIGHT FEMORAL;  Surgeon: Paralee Cancel, MD;  Location: [facility];  Service: Orthopedics;  Laterality: Right;  . GIVENS CAPSULE STUDY N/A 03/03/2020   Procedure: GIVENS CAPSULE STUDY;  Surgeon: Mauri Pole, MD;  [facility];  Service: Endoscopy;  Laterality: N/A;  . HAND SURGERY Right   . HEMOSTASIS CLIP PLACEMENT  12/20/2020   Procedure: HEMOSTASIS CLIP PLACEMENT;  Surgeon: Gatha Mayer, MD;  Location: [facility];  Service: Endoscopy;;  . HOT HEMOSTASIS N/A 12/20/2020   Procedure: HOT HEMOSTASIS (ARGON PLASMA COAGULATION/BICAP);  Surgeon: Gatha Mayer, MD;  Location: [facility];  Service: Endoscopy;  Laterality: N/A;  . IR ANGIOGRAM EXTREMITY RIGHT  02/15/2018  . IR ANGIOGRAM SELECTIVE EACH ADDITIONAL VESSEL  02/15/2018  . IR ANGIOGRAM SELECTIVE EACH ADDITIONAL VESSEL  02/15/2018  . IR ANGIOGRAM SELECTIVE EACH ADDITIONAL VESSEL  02/15/2018  . IR ANGIOGRAM SELECTIVE EACH ADDITIONAL VESSEL  02/15/2018  . IR EMBO TUMOR ORGAN ISCHEMIA INFARCT INC GUIDE  ROADMAPPING  02/15/2018  . IR EMBO TUMOR ORGAN ISCHEMIA INFARCT INC GUIDE ROADMAPPING  02/15/2018  . IR US GUIDE VASC ACCESS LEFT  02/15/2018  . LUNG REMOVAL, PARTIAL Right 09/2008  . NEPHRECTOMY RADICAL     Left   . PAIN PUMP IMPLANTATION N/A  05/16/2015   Procedure: Intrathecal pain pump placement;  Surgeon: Clydell Hakim, MD;  Location: [facility];  Service: Neurosurgery;  Laterality: N/A;  Intrathecal pain pump placement   Social History   Socioeconomic History  . Marital status: Married    Spouse name: Keith [name]  . Number of children: 2  . Years of education: Not on file  . Highest education level: Not on file  Occupational History  . Occupation: Clinical biochemist   Tobacco Use  . Smoking status: Current Every Day Smoker    Packs/day: 2.00    Years: 30.00    Pack years: 60.00    Types: Cigarettes  . Smokeless tobacco: Never Used  Vaping Use  . Vaping Use: Never used  Substance and Sexual Activity  . Alcohol use: No  . Drug use: Not Currently    Types: Marijuana    Comment: Last use was on Sunday 03/31/21  . Sexual activity: Not Currently  Other Topics Concern  . Not on file  Social History Narrative   6 caffeine drinks daily    08-11-18  Unable to ask abuse questions wife with him today.   Social Determinants of Health   Financial Resource Strain: Not on file  Food Insecurity: No Food Insecurity  . Worried About Charity fundraiser in the Last Year: Never true  . Ran Out of Food in the Last Year: Never true  Transportation Needs: No Transportation Needs  . Lack of Transportation (Medical): No  . Lack of Transportation (Non-Medical): No  Physical Activity: Not on file  Stress: Not on file  Social Connections: Not on file   Family History  Problem Relation Age of Onset  . Hypertension Mother   . Diabetes Brother   . Irritable bowel syndrome Sister   . Cancer Maternal Aunt        ovarian  . Colon cancer Neg Hx    Allergies  Allergen Reactions  . Ceftriaxone Hives    Other reaction(s): red streaks Other reaction(s): red streaks  . Hydrocodone Swelling   Prior to Admission medications   Medication Sig Start Date End Date Taking? Authorizing Provider  acetaminophen (TYLENOL) 500 MG tablet Take 500 mg by  mouth every 6 (six) hours as needed for moderate pain or mild pain.   Yes [provider]  ALPRAZolam (XANAX) 1 MG tablet TAKE 1 TABLET BY MOUTH THREE TIMES A DAY AS NEEDED FOR ANXIETY Patient taking differently: Take 1 mg by mouth 2 (two) times daily. 03/13/21  Yes [name], MD  axitinib (INLYTA) 5 MG tablet TAKE 1 TABLET (5 MG TOTAL) BY MOUTH 2 (TWO) TIMES DAILY. Patient taking differently: Take 5 mg by mouth 2 (two) times daily. 03/19/21 03/19/22 Yes [name], MD  carvedilol (COREG) 12.5 MG tablet Take 1 tablet (12.5 mg total) by mouth 2 (two) times daily with a meal. 02/27/21  Yes [name], [name] Freer, MD  diphenhydrAMINE (BENADRYL) 25 MG tablet Take 25 mg by mouth daily.   Yes [provider]  ferrous sulfate 325 (65 FE) MG tablet TAKE 1 TABLET BY MOUTH EVERY DAY WITH BREAKFAST Patient taking differently: Take 325 mg by mouth daily. 10/24/20  Yes Zehr, Laban Emperor,  PA-C  furosemide (LASIX) 20 MG tablet Take 1 tablet (20 mg total) by mouth daily. 02/27/21  Yes [name], [name] Freer, MD  gabapentin (NEURONTIN) 300 MG capsule Take 300 mg by mouth at bedtime.   Yes [provider]  Glycerin-Hypromellose-PEG 400 ([employer] DRY EYE RELIEF) 0.2-0.2-1 % SOLN Place 1 drop into both eyes daily as needed (Dry eye).   Yes [provider]  hydrALAZINE (APRESOLINE) 25 MG tablet Take 1 tablet (25 mg total) by mouth 3 (three) times daily. 02/27/21 05/28/21 Yes [name], [name] Freer, MD  HYDROmorphone (DILAUDID) 8 MG tablet Take 8 mg by mouth 3 (three) times daily.   Yes [provider]  Magnesium Oxide 400 MG CAPS Take 1 capsule (400 mg total) by mouth daily. 02/06/21  Yes Shelly Coss, MD  PAIN MANAGEMENT INTRATHECAL, IT, PUMP 1 each by Intrathecal route continuous. Intrathecal (IT) medication:   Morphine.  Can push every 4 hours   Yes [provider]  pantoprazole (PROTONIX) 40 MG tablet Take 1 tablet (40 mg total) by mouth daily. 12/21/20  Yes Oswald Hillock, MD  tiZANidine (ZANAFLEX) 4 MG tablet Take 4 mg by mouth 3 (three) times daily.   Yes [provider]  Cyanocobalamin (B-12) 1000 MCG TABS Take 1 tablet (1,000 mcg total) by mouth daily. Patient not taking: Reported on 03/27/2021 12/31/20   Willia Craze, NP  ondansetron (ZOFRAN) 4 MG tablet Take one tablet every morning.  Can take one tablet later in the day on an as needed basis Patient taking differently: Take one tablet every morning.  Can take one tablet later in the day on an as needed basis 10/10/20   [name], MD   No results found.  Positive ROS: All other systems have been reviewed and were otherwise negative with the exception of those mentioned in the HPI and as above.  Physical Exam: General: Alert, no acute distress Cardiovascular: No pedal edema Respiratory: No cyanosis, no use of accessory musculature GI: No organomegaly, abdomen is soft and non-tender Skin: No lesions in the area of chief complaint Neurologic: Sensation intact distally Psychiatric: Patient is competent for consent with normal mood and affect Lymphatic: No axillary or cervical lymphadenopathy  MUSCULOSKELETAL:  Left arm is warm and well-perfused.  No open lesions.  Mild bruising and tenderness noted along the proximal aspect of the shoulder.  Assessment: Left shoulder closed pathologic proximal humerus fracture Metastatic renal cell carcinoma  Plan: -Patient has been seen prior to my encounter today by interventional radiology and has had an interventional embolization of the hypervascular left proximal humerus renal cell tumor.  This will hopefully help manage some of her intraoperative and postoperative blood loss.  -The plan from here is for intramedullary nail for stabilization and prophylactic spanning of the distal humerus as well in case other lesions occur.  This will also allow for early weightbearing and prophylactic/palliative X radiation therapy as well.  -We reviewed  the risk and benefits of the procedure in detail including but not limited to bleeding, infection, damage to surrounding nerves vessels, hardware failure, painful hardware, need for further surgery, malunion, nonunion, and the risk of anesthesia.  He has provided informed consent.  -We will plan for discharge home postoperative from PACU.    Keith Stairs, MD Cell [phone]    04/04/2021 2:43 PM

## 2021-04-04 NOTE — Sedation Documentation (Signed)
5 fr sheath to be connected to pressure bag. Procedure on hold due to emergent procedure in CT.

## 2021-04-04 NOTE — Telephone Encounter (Signed)
Tried to contact pt on 4/13 at 1:11pm to find out if he had been to his PCP to address his potassium or had labs. No answer, left vm for pt to return call. AW

## 2021-04-04 NOTE — Anesthesia Preprocedure Evaluation (Addendum)
Anesthesia Evaluation  Patient identified by MRN, date of birth, ID band Patient awake    Reviewed: Allergy & Precautions, NPO status , Patient's Chart, lab work & pertinent test results, reviewed documented beta blocker date and time   History of Anesthesia Complications Negative for: history of anesthetic complications  Airway Mallampati: II  TM Distance: >3 FB Neck ROM: Full    Dental  (+) Edentulous Upper, Edentulous Lower   Pulmonary COPD, Current Smoker and Patient abstained from smoking., PE Lung mets from primary renal cancer   Pulmonary exam normal        Cardiovascular hypertension, Pt. on medications and Pt. on home beta blockers +CHF  Normal cardiovascular exam     Neuro/Psych Anxiety Depression negative neurological ROS     GI/Hepatic negative GI ROS, Neg liver ROS,   Endo/Other  diabetes (diet controlled)  Renal/GU CRFRenal disease (metastatic renal cancer; Cr 1.40)  negative genitourinary   Musculoskeletal negative musculoskeletal ROS (+)   Abdominal   Peds  Hematology  (+) anemia ,   Anesthesia Other Findings  Echo 02/01/21: EF 40-45%, mild LVH, g1dd, normal RV function, small pericardial effusion, valves unremarkable  Reproductive/Obstetrics                             Anesthesia Physical Anesthesia Plan  ASA: III  Anesthesia Plan: General   Post-op Pain Management: GA combined w/ Regional for post-op pain   Induction: Intravenous  PONV Risk Score and Plan: 1 and Ondansetron, Dexamethasone, Treatment may vary due to age or medical condition and Midazolam  Airway Management Planned: Oral ETT  Additional Equipment: None  Intra-op Plan:   Post-operative Plan: Extubation in OR  Informed Consent: I have reviewed the patients History and Physical, chart, labs and discussed the procedure including the risks, benefits and alternatives for the proposed anesthesia  with the patient or authorized representative who has indicated his/her understanding and acceptance.     Dental advisory given and Consent reviewed with POA  Plan Discussed with:   Anesthesia Plan Comments: (Plan originally discussed with patient 3 days ago prior to being cancelled for elevated potassium. Today I spoke again with his wife as he received sedation in IR prior to completion of consent form.)       Anesthesia Quick Evaluation

## 2021-04-04 NOTE — Anesthesia Postprocedure Evaluation (Signed)
Anesthesia Post Note  Patient: Keith [name]  Procedure(s) Performed: INTRAMEDULLARY (IM) NAIL HUMERUS (Left Arm Upper)     Patient location during evaluation: PACU Anesthesia Type: General Level of consciousness: awake and alert Pain management: pain level controlled Vital Signs Assessment: post-procedure vital signs reviewed and stable Respiratory status: spontaneous breathing, nonlabored ventilation, respiratory function stable and patient connected to nasal cannula oxygen Cardiovascular status: blood pressure returned to baseline and stable Postop Assessment: no apparent nausea or vomiting Anesthetic complications: no   No complications documented.  Last Vitals:  Vitals:   04/04/21 2016 04/04/21 2036  BP: (!) 144/88 (!) 149/90  Pulse: 88 87  Resp: (!) 21 18  Temp:  36.7 C  SpO2: 95% 95%    Last Pain:  Vitals:   04/04/21 2036  PainSc: 2                  Effie Berkshire

## 2021-04-04 NOTE — Anesthesia Procedure Notes (Addendum)
Procedure Name: Intubation Date/Time: 04/04/2021 6:29 PM Performed by: Barrington Ellison, CRNA Pre-anesthesia Checklist: Patient identified, Emergency Drugs available, Suction available and Patient being monitored Patient Re-evaluated:Patient Re-evaluated prior to induction Oxygen Delivery Method: Circle System Utilized Preoxygenation: Pre-oxygenation with 100% oxygen Induction Type: IV induction Ventilation: Mask ventilation without difficulty and Two handed mask ventilation required Laryngoscope Size: Mac and 4 Grade View: Grade II Tube type: Oral Tube size: 7.5 mm Number of attempts: 1 Airway Equipment and Method: Stylet and Oral airway Placement Confirmation: ETT inserted through vocal cords under direct vision,  positive ETCO2 and breath sounds checked- equal and bilateral Secured at: 21 cm Tube secured with: Tape Dental Injury: Teeth and Oropharynx as per pre-operative assessment  Comments: Intubation performed by Rexford Maus, SRNA

## 2021-04-04 NOTE — Procedures (Signed)
Interventional Radiology Procedure Note  Procedure: Left upper extremity angiogram and partial embolization of proximal humerus metastasis.   Complications: None  Estimated Blood Loss: None  Recommendations: - 3F Celt closure --> bedrest 1 hour - To OR for ORIF   Signed,  Criselda Peaches, MD

## 2021-04-04 NOTE — Anesthesia Procedure Notes (Signed)
Anesthesia Regional Block: Interscalene brachial plexus block   Pre-Anesthetic Checklist: ,, timeout performed, Correct Patient, Correct Site, Correct Laterality, Correct Procedure, Correct Position, site marked, Risks and benefits discussed,  Surgical consent,  Pre-op evaluation,  At surgeon's request and post-op pain management  Laterality: Left  Prep: chloraprep       Needles:  Injection technique: Single-shot  Needle Type: Echogenic Stimulator Needle     Needle Length: 10cm  Needle Gauge: 20     Additional Needles:   Procedures:,,,, ultrasound used (permanent image in chart),,,,  Narrative:  Start time: 04/04/2021 5:02 PM End time: 04/04/2021 5:09 PM Injection made incrementally with aspirations every 5 mL.  Performed by: Personally  Anesthesiologist: Lidia Collum, MD  Additional Notes: Standard monitors applied. Skin prepped. Good needle visualization with ultrasound. Injection made in 5cc increments with no resistance to injection. Patient tolerated the procedure well.

## 2021-04-04 NOTE — Discharge Instructions (Signed)
-  Maintain postoperative bandages for 3 days.  If these become saturated you may remove sooner.  Otherwise remove on postoperative day #3 and you may begin showering at that time.  You should then cover your wounds with daily dry dressings.  -Sling is to be worn for comfort only.  You may remove the sling when the nerve block resolves and/or when you are ready to use the arm.  You can weight-bear with the arm up to 5 pounds.  You can move the arm at the shoulder as tolerated.  No lifting over shoulder height.  -Apply ice to the shoulder liberally for 20 to 30 minutes out of each hour that you are able and awake.  -For mild to moderate pain use Tylenol and Advil around-the-clock.  For breakthrough pain use dilaudid as necessary.  -Return to see Dr. Stann Mainland in the office in 2 weeks for routine postoperative check and staple removal and x-rays.

## 2021-04-04 NOTE — H&P (Signed)
Referring Physician(s): [place]  Supervising Physician: Jacqulynn Cadet  Patient Status:  [facility] OP TBA  Chief Complaint: Left arm pain, metastatic renal cell carcinoma   Subjective: Patient familiar to IR service from particle embolization of both a proximal and distal femoral hypervascular renal cell metastasis in 2019.  He is a 55 year old male with past medical history of PE, hypertension, prior GI bleed, CHF, anxiety, anemia and stage IV renal cell carcinoma with prior left nephrectomy.  Patient had fall at home on 03/20/2021 with imaging revealing a lytic lesion within the proximal left humerus complicated by pathologic fracture involving the surgical neck of the humerus with fracture fragments in near anatomic alignment.  He presents today for preoperative embolization of the left proximal humeral met for perioperative blood loss reduction prior to planned intramedullary nailing.  He currently denies fever, headache, chest pain, worsening dyspnea, cough, abdominal pain, nausea, vomiting or bleeding.  He does have occasional back pain, left arm/shoulder pain.  Left arm is currently in sling.  Patient also has a left lower abdominal pain pump.  Additional medical history as below.  Past Medical History:  Diagnosis Date  . Anemia   . Anxiety   . CHF (congestive heart failure) ([place])   . Diabetes mellitus without complication ([place])    diet controlled only - wife denies this  . GI bleed   . Hypertension   . left renal ca d'd 2008  . Malignant neoplasm metastatic to pancreas ([place]) 2012  . 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 Metastasis to bone ([place]) 2014/2016  . Pulmonary embolus [facility])    Past Surgical History:  Procedure Laterality Date  . BIOPSY  08/28/2019   Procedure: BIOPSY;  Surgeon: Thornton Park, MD;  Location: [facility];  Service: Gastroenterology;;  . BIOPSY  03/01/2020   Procedure: BIOPSY;  Surgeon: Mauri Pole, MD;  Location: [facility];  Service: Endoscopy;;  . BIOPSY   12/19/2020   Procedure: BIOPSY;  Surgeon: Yetta Flock, MD;  Location: [facility];  Service: Gastroenterology;;  . BIOPSY  12/20/2020   Procedure: BIOPSY;  Surgeon: Gatha Mayer, MD;  Location: [facility];  Service: Endoscopy;;  . COLONOSCOPY W/ POLYPECTOMY    . COLONOSCOPY WITH PROPOFOL N/A 03/01/2020   Procedure: COLONOSCOPY WITH PROPOFOL;  Surgeon: Mauri Pole, MD;  Location: [facility];  Service: Endoscopy;  Laterality: N/A;  . COLONOSCOPY WITH PROPOFOL N/A 12/20/2020   Procedure: COLONOSCOPY WITH PROPOFOL;  Surgeon: Gatha Mayer, MD;  Location: [facility];  Service: Endoscopy;  Laterality: N/A;  . ESOPHAGOGASTRODUODENOSCOPY (EGD) WITH PROPOFOL N/A 08/28/2019   Procedure: ESOPHAGOGASTRODUODENOSCOPY (EGD) WITH PROPOFOL;  Surgeon: Thornton Park, MD;  Location: [facility];  Service: Gastroenterology;  Laterality: N/A;  . ESOPHAGOGASTRODUODENOSCOPY (EGD) WITH PROPOFOL N/A 03/01/2020   Procedure: ESOPHAGOGASTRODUODENOSCOPY (EGD) WITH PROPOFOL;  Surgeon: Mauri Pole, MD;  Location: [facility];  Service: Endoscopy;  Laterality: N/A;  . ESOPHAGOGASTRODUODENOSCOPY (EGD) WITH PROPOFOL N/A 12/19/2020   Procedure: ESOPHAGOGASTRODUODENOSCOPY (EGD) WITH PROPOFOL;  Surgeon: Yetta Flock, MD;  [facility]: [facility];  Service: Gastroenterology;  Laterality: N/A;  . FEMUR IM NAIL Right 02/16/2018   Procedure: INTRAMEDULLARY (IM) NAIL RIGHT FEMORAL;  Surgeon: Paralee Cancel, MD;  Location: [facility];  Service: Orthopedics;  Laterality: Right;  . GIVENS CAPSULE STUDY N/A 03/03/2020   Procedure: GIVENS CAPSULE STUDY;  Surgeon: Mauri Pole, MD;  Location: [facility];  Service: Endoscopy;  Laterality: N/A;  . HAND SURGERY Right   . HEMOSTASIS CLIP PLACEMENT  12/20/2020  Procedure: HEMOSTASIS CLIP PLACEMENT;  Surgeon: Gatha Mayer, MD;  Location: [facility];  Service: Endoscopy;;  . HOT HEMOSTASIS N/A 12/20/2020   Procedure: HOT HEMOSTASIS (ARGON  PLASMA COAGULATION/BICAP);  Surgeon: Gatha Mayer, MD;  Location: [facility];  Service: Endoscopy;  Laterality: N/A;  . IR ANGIOGRAM EXTREMITY RIGHT  02/15/2018  . IR ANGIOGRAM SELECTIVE EACH ADDITIONAL VESSEL  02/15/2018  . IR ANGIOGRAM SELECTIVE EACH ADDITIONAL VESSEL  02/15/2018  . IR ANGIOGRAM SELECTIVE EACH ADDITIONAL VESSEL  02/15/2018  . IR ANGIOGRAM SELECTIVE EACH ADDITIONAL VESSEL  02/15/2018  . IR EMBO TUMOR ORGAN ISCHEMIA INFARCT INC GUIDE ROADMAPPING  02/15/2018  . IR EMBO TUMOR ORGAN ISCHEMIA INFARCT INC GUIDE ROADMAPPING  02/15/2018  . IR US GUIDE VASC ACCESS LEFT  02/15/2018  . LUNG REMOVAL, PARTIAL Right 09/2008  . NEPHRECTOMY RADICAL     Left   . PAIN PUMP IMPLANTATION N/A 05/16/2015   Procedure: Intrathecal pain pump placement;  Surgeon: Clydell Hakim, MD;  Location: [place] [facility];  Service: Neurosurgery;  Laterality: N/A;  Intrathecal pain pump placement     Allergies: Ceftriaxone and Hydrocodone  Medications: Prior to Admission medications   Medication Sig Start Date End Date Taking? Authorizing Provider  acetaminophen (TYLENOL) 500 MG tablet Take 500 mg by mouth every 6 (six) hours as needed for moderate pain or mild pain.   Yes [provider]  ALPRAZolam (XANAX) 1 MG tablet TAKE 1 TABLET BY MOUTH THREE TIMES A DAY AS NEEDED FOR ANXIETY Patient taking differently: Take 1 mg by mouth 2 (two) times daily. 03/13/21  Yes [name], MD  axitinib (INLYTA) 5 MG tablet TAKE 1 TABLET (5 MG TOTAL) BY MOUTH 2 (TWO) TIMES DAILY. Patient taking differently: Take 5 mg by mouth 2 (two) times daily. 03/19/21 03/19/22 Yes [name], MD  carvedilol (COREG) 12.5 MG tablet Take 1 tablet (12.5 mg total) by mouth 2 (two) times daily with a meal. 02/27/21  Yes [name], [name] Freer, MD  diphenhydrAMINE (BENADRYL) 25 MG tablet Take 25 mg by mouth daily.   Yes [provider]  ferrous sulfate 325 (65 FE) MG tablet TAKE 1 TABLET BY MOUTH EVERY DAY WITH BREAKFAST Patient  taking differently: Take 325 mg by mouth daily. 10/24/20  Yes Zehr, Laban Emperor, PA-C  furosemide (LASIX) 20 MG tablet Take 1 tablet (20 mg total) by mouth daily. 02/27/21  Yes [name], [name] Freer, MD  gabapentin (NEURONTIN) 300 MG capsule Take 300 mg by mouth at bedtime.   Yes [provider]  Glycerin-Hypromellose-PEG 400 ([facility] DRY EYE RELIEF) 0.2-0.2-1 % SOLN Place 1 drop into both eyes daily as needed (Dry eye).   Yes [provider]  hydrALAZINE (APRESOLINE) 25 MG tablet Take 1 tablet (25 mg total) by mouth 3 (three) times daily. 02/27/21 05/28/21 Yes [name], [name] Freer, MD  HYDROmorphone (DILAUDID) 8 MG tablet Take 8 mg by mouth 3 (three) times daily.   Yes [provider]  Magnesium Oxide 400 MG CAPS Take 1 capsule (400 mg total) by mouth daily. 02/06/21  Yes Shelly Coss, MD  PAIN MANAGEMENT INTRATHECAL, IT, PUMP 1 each by Intrathecal route continuous. Intrathecal (IT) medication:   Morphine.  Can push every 4 hours   Yes [provider]  pantoprazole (PROTONIX) 40 MG tablet Take 1 tablet (40 mg total) by mouth daily. 12/21/20  Yes Oswald Hillock, MD  tiZANidine (ZANAFLEX) 4 MG tablet Take 4 mg by mouth 3 (three) times daily.   Yes [provider]  Cyanocobalamin (B-12) 1000 MCG TABS Take 1 tablet (1,000 mcg total) by mouth daily. Patient not taking: Reported on 03/27/2021 12/31/20   Willia Craze, NP  ondansetron (ZOFRAN) 4 MG tablet Take one tablet every morning.  Can take one tablet later in the day on an as needed basis Patient taking differently: Take one tablet every morning.  Can take one tablet later in the day on an as needed basis 10/10/20   [name], MD     Vital Signs:pending   Physical Exam awake, alert.  Chest clear to auscultation bilaterally.  Heart with regular rate and rhythm.  Abdomen soft, positive bowel sounds, nontender, left lower abdominal subcutaneous pain pump in place; left arm in sling; no sig LE  edema  Imaging: No results found.  Labs:  CBC: Recent Labs    02/27/21 1102 02/27/21 1941 03/20/21 1435 04/01/21 0739 04/01/21 1054  WBC 7.1 8.0 10.7* 6.9  --   HGB 10.5* 9.9* 10.0* 10.7* 11.9*  HCT 33.7* 34.3* 33.9* 35.7* 35.0*  PLT 286 238 293 278  --     COAGS: Recent Labs    12/18/20 1630 04/01/21 0739  INR 2.2* 1.1  APTT 45* 28    BMP: Recent Labs    04/24/20 0754 06/19/20 0758 06/30/20 1709 09/21/20 0854 11/20/20 1120 02/18/21 1003 02/27/21 1102 02/27/21 1941 03/20/21 1435 04/01/21 0739 04/01/21 1054  NA 143 141 142 138   < > 138 138 139 140 133* 135  K 4.5 4.1 4.0 4.6   < > 4.9 6.5* 5.5* 4.6 6.2* 6.8*  CL 114* 109 106 112*   < > 106 104 104 101 98 100  CO2 _0 < > _1 --   GLUCOSE 106* 90 87 106*   < > 104* 94 144* 85 118* 99  BUN _2 < > 23* 28* 32* 16 26* 30*  CALCIUM 7.9* 8.1* 8.1* 7.8*   < > 9.2 8.9 9.0 9.1 10.5*  --   CREATININE 1.30* 1.31* 1.41* 1.45*   < > 1.52* 1.46* 1.62* 1.45* 1.50* 1.60*  GFRNONAA >60 >60 56* 55*   < > 54*  --  50* 58* 55*  --   GFRAA >60 >60 >60 >60  --   --   --   --   --   --   --    < > = values in this interval not displayed.    LIVER FUNCTION TESTS: Recent Labs    02/15/21 1215 02/18/21 1003 02/27/21 1941 03/20/21 1435  BILITOT 0.4 0.3 0.4 0.3  AST _3 ALT 36 _4 ALKPHOS 102 109 98 135*  PROT 6.9 6.7 7.7 8.0  ALBUMIN 2.6* 2.6* 3.2* 2.9*    Assessment and Plan: Patient familiar to IR service from particle embolization of both a proximal and distal femoral hypervascular renal cell metastasis in 2019.  He is a 55 year old male with past medical history of PE, hypertension, prior GI bleed, CHF, anxiety, anemia and stage IV renal cell carcinoma with prior left nephrectomy.  Patient had fall at home on 03/20/2021 with imaging revealing a lytic lesion within the proximal left humerus complicated by pathologic fracture involving the surgical neck of the humerus with  fracture fragments in near anatomic alignment.  He presents today for preoperative embolization of the left proximal humeral met for perioperative blood loss reduction prior to planned  intramedullary nailing.Risks and benefits of procedure were discussed with the patient including, but not limited to bleeding, infection, vascular injury or contrast induced renal failure.  This interventional procedure involves the use of X-rays and because of the nature of the planned procedure, it is possible that we will have prolonged use of X-ray fluoroscopy.  Potential radiation risks to you include (but are not limited to) the following: - A slightly elevated risk for cancer  several years later in life. This risk is typically less than 0.5% percent. This risk is low in comparison to the normal incidence of human cancer, which is 33% for women and 50% for men according to the [place]. - Radiation induced injury can include skin redness, resembling a rash, tissue breakdown / ulcers and hair loss (which can be temporary or permanent).   The likelihood of either of these occurring depends on the difficulty of the procedure and whether you are sensitive to radiation due to previous procedures, disease, or genetic conditions.   IF your procedure requires a prolonged use of radiation, you will be notified and given written instructions for further action.  It is your responsibility to monitor the irradiated area for the 2 weeks following the procedure and to notify your physician if you are concerned that you have suffered a radiation induced injury.    All of the patient's questions were answered, patient is agreeable to proceed.  Consent signed and in chart.  Last K prior to today was 4.9 on 04/03/21 at primary MD office; today's BMP pending    Electronically Signed: D. Rowe Robert, PA-C 04/04/2021, 1:29 PM   I spent a total of 25 minutes at the the patient's bedside AND on the patient's  hospital floor or unit, greater than 50% of which was counseling/coordinating care for left upper extremity arteriogram with embolization of left humeral metastasis from renal cell carcinoma

## 2021-04-04 NOTE — Transfer of Care (Signed)
Immediate Anesthesia Transfer of Care Note  Patient: Keith [name]  Procedure(s) Performed: INTRAMEDULLARY (IM) NAIL HUMERUS (Left Arm Upper)  Patient Location: PACU  Anesthesia Type:General  Level of Consciousness: awake  Airway & Oxygen Therapy: Patient Spontanous Breathing  Post-op Assessment: Report given to RN and Post -op Vital signs reviewed and stable  Post vital signs: Reviewed and stable  Last Vitals:  Vitals Value Taken Time  BP 133/88 04/04/21 2001  Temp    Pulse 94 04/04/21 2006  Resp 22 04/04/21 2006  SpO2 93 % 04/04/21 2006  Vitals shown include unvalidated device data.  Last Pain:  Vitals:   04/04/21 1600  PainSc: 5          Complications: No complications documented.

## 2021-04-04 NOTE — Brief Op Note (Signed)
04/01/2021 - 04/04/2021  7:38 PM  PATIENT:  Keith [name]  55 y.o. male  PRE-OPERATIVE DIAGNOSIS:  Left humerus pathologic fracture  POST-OPERATIVE DIAGNOSIS:  Left humerus pathologic fracture  PROCEDURE:  Procedure(s) with comments: INTRAMEDULLARY (IM) NAIL HUMERUS (Left) - 2 hrs Need  Interventional radiology for immobilization of renal cell tumor- to be done same day prior to [place]:  Surgeon(s) and Role:    * Stann Mainland, Elly Modena, MD - Primary  PHYSICIAN ASSISTANT: Jonelle Sidle, PA-C   ANESTHESIA:   regional and general  EBL: 50 cc  BLOOD ADMINISTERED:none  DRAINS: none   LOCAL MEDICATIONS USED:  NONE  SPECIMEN:  No Specimen  DISPOSITION OF SPECIMEN:  N/A  COUNTS:  YES  TOURNIQUET:  * No tourniquets in log *  DICTATION: .Note written in EPIC  PLAN OF CARE: Discharge to home after PACU  PATIENT DISPOSITION:  PACU - hemodynamically stable.   Delay start of Pharmacological VTE agent (>24hrs) due to surgical blood loss or risk of bleeding: not applicable

## 2021-04-04 NOTE — OR Nursing (Signed)
Care of patient assumed at 30.

## 2021-04-04 NOTE — Op Note (Signed)
04/01/2021 - 04/04/2021  7:39 PM  PATIENT:  Keith [name]    PRE-OPERATIVE DIAGNOSIS:  Left humerus pathologic fracture  POST-OPERATIVE DIAGNOSIS:  Same  PROCEDURE:  INTRAMEDULLARY (IM) NAIL HUMERUS  SURGEON:  Nicholes Stairs, MD   Assistant:  Jonelle Sidle, PA-C  PHYSICIAN ASSISTANT: Jonelle Sidle PA-C, present and scrubbed throughout the case, critical for completion in a timely fashion, and for retraction, instrumentation, and closure.  ANESTHESIA:   General  PREOPERATIVE INDICATIONS:  Keith [name] is a  55 y.o. male with a diagnosis of Left humerus pathologic fracture who elected for surgical management.    The risks benefits and alternatives were discussed with the patient including but not limited to the risks of nonoperative treatment, versus surgical intervention including infection, bleeding, nerve injury, malunion, nonunion, the need for revision surgery, hardware prominence, hardware failure, the need for hardware removal, blood clots, cardiopulmonary complications, conversion to arthroplasty, morbidity, mortality, among others, and they were willing to proceed.  Predicted outcome is good, although there will be at least a six to nine month expected recovery.   OPERATIVE IMPLANTS:  Synthes Humeral Nail 270 mm x 7 mm 3 multilock proximal screws 1 distal interlock  OPERATIVE FINDINGS: Displaced proximal humerus fracture.  UNIQUE ASPECTS OF THE CASE:   Very poor bone quality.  OPERATIVE PROCEDURE: The patient was brought to the operating room and placed in the supine position. General anesthesia was administered. IV antibiotics were given. He was placed in the beach chair position. All bony prominences were padded. The upper extremity was prepped and draped in usual sterile fashion.   We began the procedure by making an anterior lateral approach to the proximal humerus.  Just off the anterior lateral corner a approximate 4 cm incision was made.  Dissection was  carried down through skin and subcutaneous tissue to the level of the deltoid.  Between the anterior and medial deltoid at the raphae we bluntly opened up this interval.  We then encountered the subacromial and subdeltoid space.  Blunt retractor was placed.  We then opened up the supraspinatus in line with its fibers at the muscular and tendinous portion.  Care was taken to leave the tendinous attachment attached to the greater tuberosity.  We then utilized the initiating guidepin.  On AP and lateral fluoroscopy we identified the center most position of the humeral head on both views.  We then used the opening reamer to open the canal.  After opening the canal a guide rod was placed down to the distal humerus.  We then measured this on fluoroscopy at a 270 mm length.  Care was taken to maintain reduction and then a 8.5 mm reamer was placed down the canal.  Next, we applied the intramedullary rod.  This was a 7 mm x 270 mm length.  This was inserted with mallet.  X-rays were used to confirm the appropriate depth to countersink the nail proximally.  We then used a minimally invasive technique to apply 3 proximal multilock screws through the jig.  We first cut the skin bluntly dissected down to the lateral and anterior humerus.  We then under direct fluoroscopic guidance placed unicortical screws with multilock head threads.  Next, we moved to place a distal interlocking screw from anterior to posterior.  Utilizing perfect circle technique in the AP position we identified the proximal most of the 3 distal options.  We used a knife and then bluntly dissected down to the anterior humeral cortex.  Drill pin was  placed across the interlocking screw hole.  We measured this at 30 mm.  We then placed a 30 mm distal interlocking screw on hand.  X-rays were checked AP and lateral, to confirm position in length.    Once complete fixation and reduction of been achieved, took final C-arm pictures, and irrigated the wounds  copiously, and repaired the deltoid fascia with Vicryl followed by Vicryl for the subcutaneous tissue with Monocryl and staples for the skin. He was placed in a sling. He had a preoperative regional block as well. He tolerated the procedure well with no complications.  Disposition:  Keith [name] will be in his sling for comfort only.  He may remove the sling ad lib.  He can use the arm as tolerated below shoulder height.  No lifting overhead for the first 3 weeks.  I will see him back in the office in 2 weeks for postoperative wound check and x-rays.  Will discharge home today from PACU.

## 2021-04-05 ENCOUNTER — Other Ambulatory Visit: Payer: Self-pay | Admitting: Oncology

## 2021-04-05 ENCOUNTER — Encounter (HOSPITAL_COMMUNITY): Payer: Self-pay | Admitting: Orthopedic Surgery

## 2021-04-06 IMAGING — CT CT ABDOMEN AND PELVIS WITHOUT AND WITH CONTRAST
2 of 12 series · 10 of 46 positions shown, 15 images · IV contrast (omnipaque)
Comparison: CT scan 11/24/2018

CLINICAL DATA: Followup metastatic renal cell cancer.

EXAM:
CT CHEST WITH CONTRAST
CT ABDOMEN AND PELVIS WITH AND WITHOUT CONTRAST
TECHNIQUE: Multidetector CT imaging of the chest was performed during
intravenous contrast administration. Multidetector CT imaging of the
abdomen and pelvis was performed following the standard protocol
before and during bolus administration of intravenous contrast.
CONTRAST:  100mL OMNIPAQUE IOHEXOL 300 MG/ML  SOLN

[Series 7: axial nephro · axial · 0.73mm/px · z∈[-651,-84]mm · 8 of 237 slices shown, 13 images]
[im 24/237  soft-tissue]
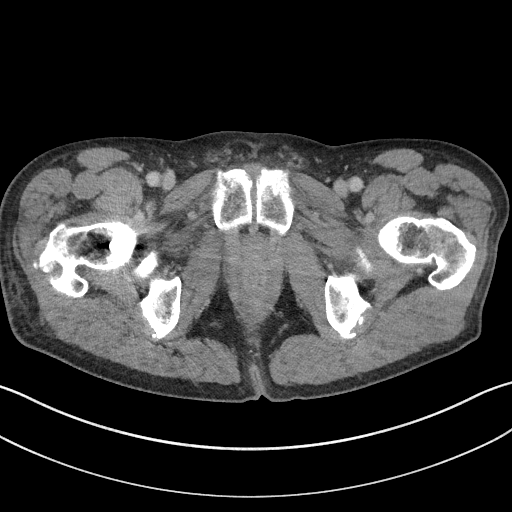
[im 24/237  bone]
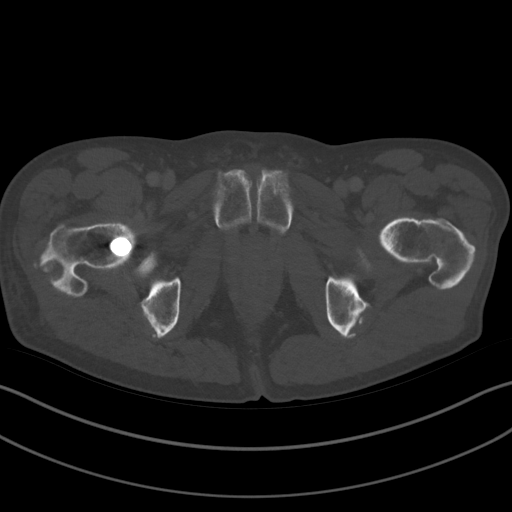
[im 48/237  soft-tissue]
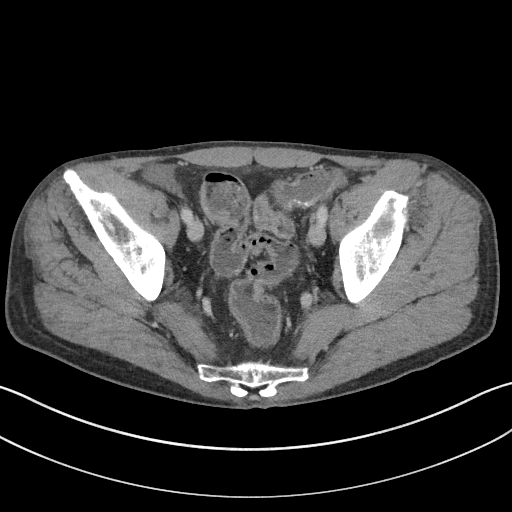
[im 71/237  soft-tissue]
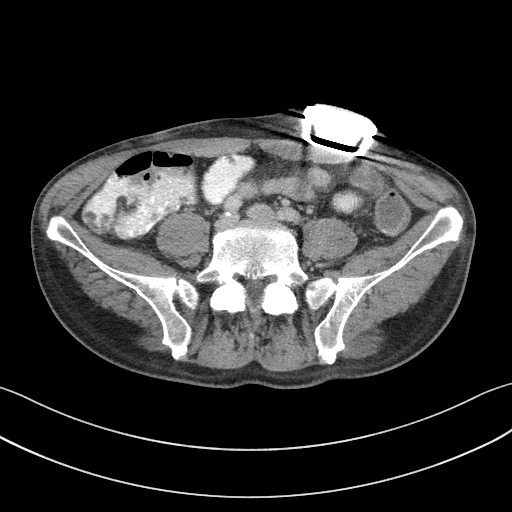
[im 95/237  soft-tissue]
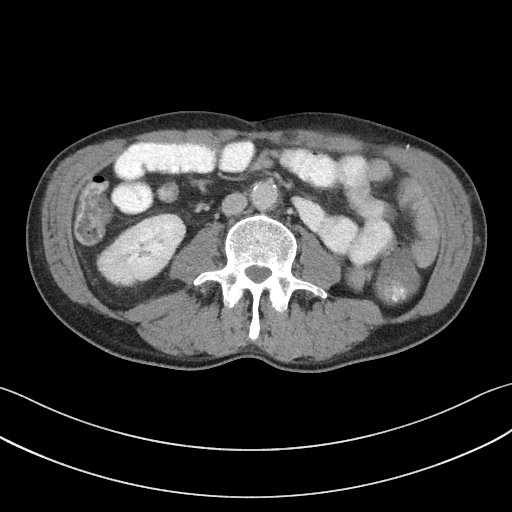
[im 142/237  soft-tissue]
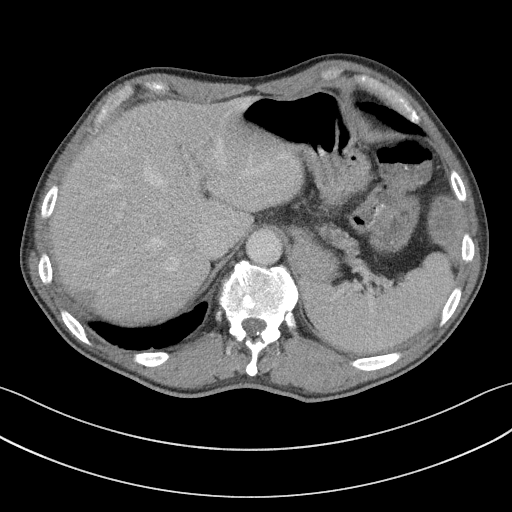
[im 142/237  lung]
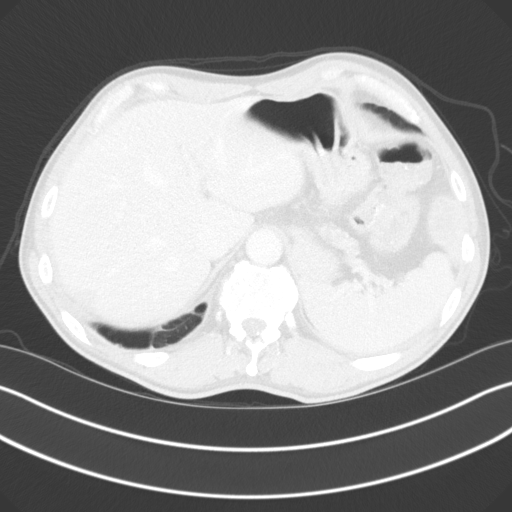
[im 166/237  soft-tissue]
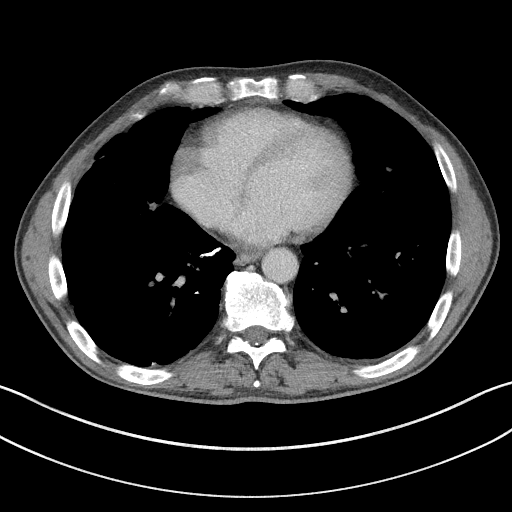
[im 166/237  lung]
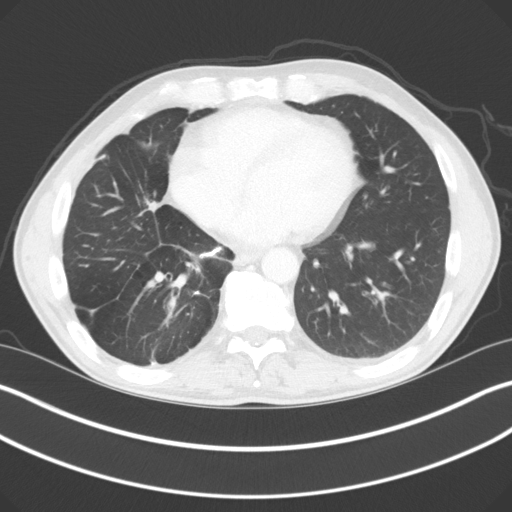
[im 189/237  soft-tissue]
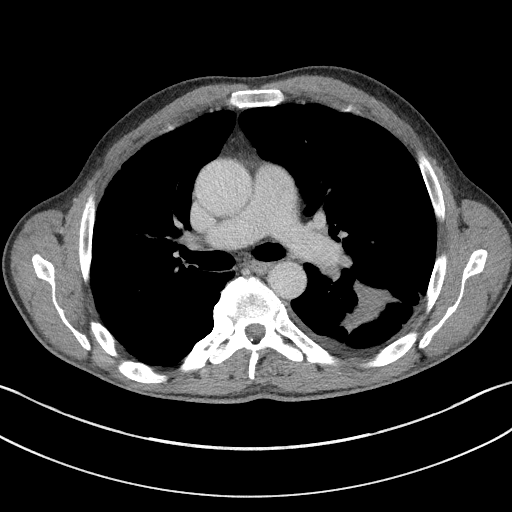
[im 189/237  lung]
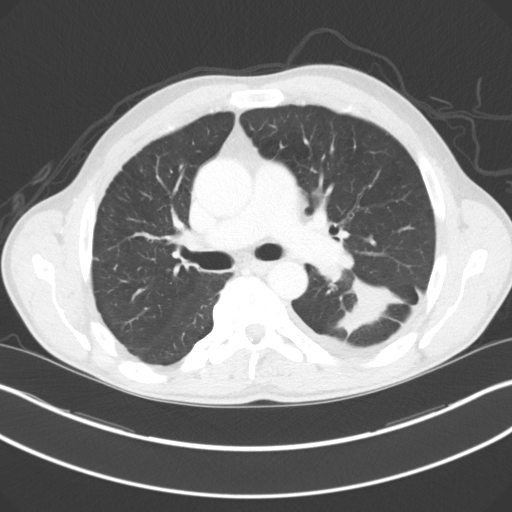
[im 213/237  soft-tissue]
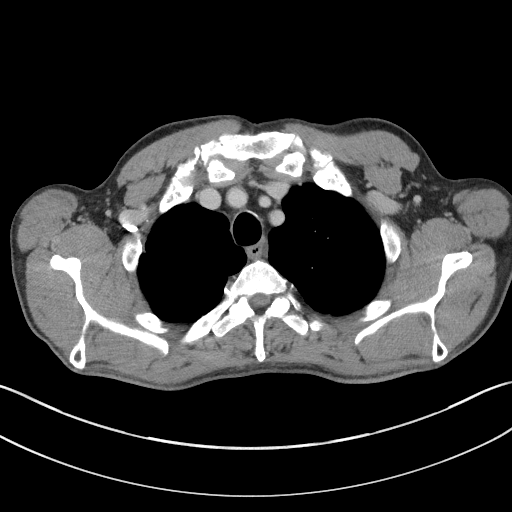
[im 213/237  lung]
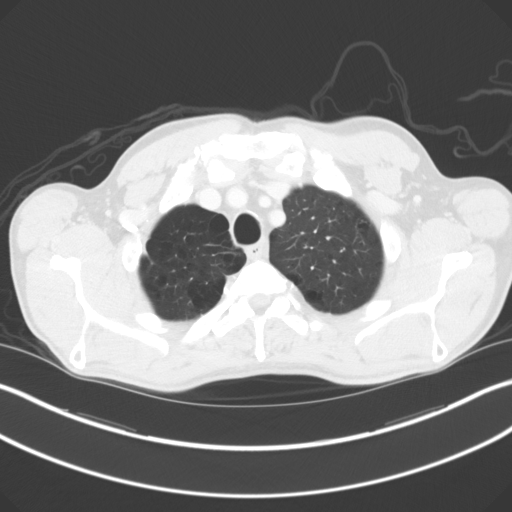

[Series 10: coronal pre · coronal · non-contrast · 0.52mm/px · 2 of 101 slices shown]
[im 34/101  soft-tissue]
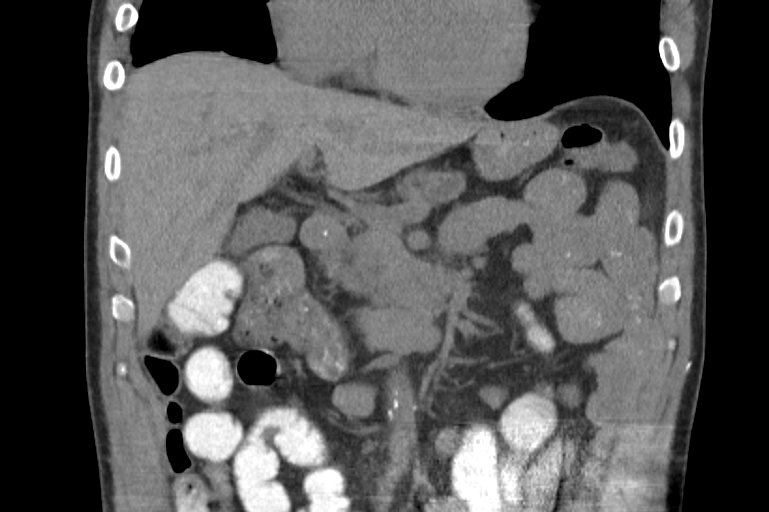
[im 67/101  soft-tissue]
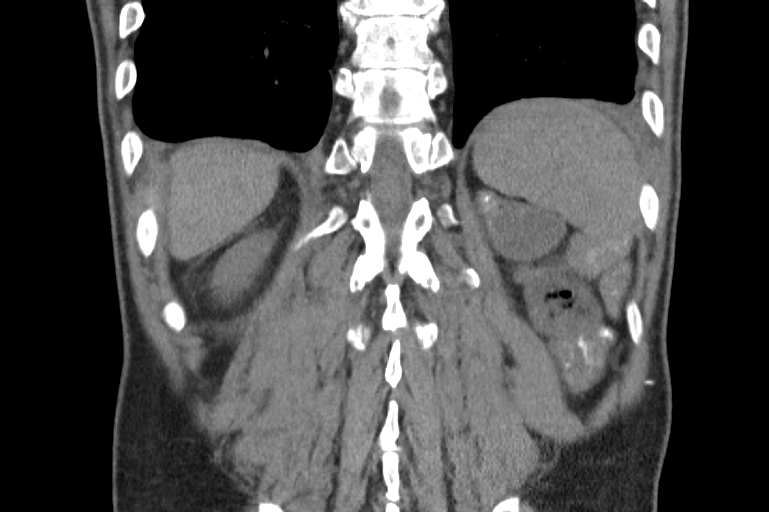

[10 of 46 positions shown; findings below may reference images not displayed]

FINDINGS: CT CHEST FINDINGS

Cardiovascular: The heart is normal in size. No pericardial
effusion. Stable tortuosity and ectasia of the thoracic aorta but no
atherosclerotic calcifications. The branch vessels are patent. No
definite coronary artery calcifications.

Mediastinum/Nodes: No mediastinal or hilar mass or adenopathy. Small
scattered lymph nodes are noted. The esophagus is grossly normal.

Lungs/Pleura: Stable underlying emphysematous changes and areas of
pulmonary scarring.

Stable large irregular area of nodularity in the right upper lobe
perihilar region. This measures approximately 3.5 x 1.5 cm and is
unchanged. The dense scarring changes radiating from this area to
the pleura are also stable.

The right infrahilar/right middle lobe area of dense appearing scar
tissue measures 3.3 x 2.0 cm and is stable.

Stable left basilar subpleural scarring type changes and nodularity.

New airspace opacity in the superior segment of the left lower lobe
measuring 4.4 x 3.1 cm on image number 74 of series 8. This looks
more inflammatory or infectious and tumor but close follow-up is
suggested.

16 x 13 mm sub solid nodular density in the left lower lobe on image
number 92 appears stable.

Stable surgical scarring changes involving the right lower lobe.

Musculoskeletal: No chest wall mass, supraclavicular or axillary
adenopathy. The thyroid gland appears normal.

No worrisome bone lesions. Stable degenerative changes involving the
thoracic spine.

CT ABDOMEN AND PELVIS FINDINGS

Hepatobiliary: No worrisome hepatic lesions. There is a small
peripheral vascular shunt noted in the right lobe posteriorly. The
gallbladder is unremarkable. No common bile duct dilatation.

Pancreas: Stable enhancing pancreatic masses. The larger mass in the
pancreatic head measures 3 x 3 cm and is unchanged. The second
lesion in the pancreatic body measures 2.8 x 1.9 cm and is also
stable. Stable marked dilatation of the main pancreatic duct
proximal to this mass and marked atrophy of the pancreatic tail. No
new pancreatic lesions.

Spleen: Normal size.  No focal lesions.

Adrenals/Urinary Tract: The adrenal glands and right kidney are
unremarkable. The left kidney is surgically absent. No soft tissue
mass in the nephrectomy bed.

The bladder is unremarkable.

Stomach/Bowel: The stomach, duodenum, small bowel and colon are
grossly normal without oral contrast. No inflammatory changes, mass
lesions or obstructive findings. The terminal ileum and appendix are
normal.

Vascular/Lymphatic: The aorta and branch vessels are patent. The
major venous structures are patent. No mesenteric or retroperitoneal
mass or adenopathy.

Reproductive: The prostate gland and seminal vesicles are
unremarkable.

Other: No pelvic mass or adenopathy. No free pelvic fluid
collections. No inguinal mass or adenopathy.

Musculoskeletal: Stable lytic metastatic bone lesions involving the
pelvis and right hip. No new osseous metastatic lesions are
identified.
IMPRESSION: 1. Stable pulmonary lesions as detailed above.
2. New airspace opacity in the left lung as detailed above. This
could be inflammatory/infectious or a new area of tumor. I would
recommend a short-term follow-up chest CT in 3 months to reassess.
3. No mediastinal or hilar mass or adenopathy.
4. Stable pancreatic masses.
5. Stable lytic metastatic osseous lesions. No new or progressive
findings.
6. Status post left nephrectomy.  The right kidney is unremarkable.

## 2021-04-08 ENCOUNTER — Other Ambulatory Visit: Payer: Self-pay | Admitting: *Deleted

## 2021-04-08 NOTE — Patient Outreach (Addendum)
[facility] ([facility]) Care Management  04/08/2021  Jemari D Keefe 10/26/1966 [id number]   [facility] Unsuccessful outreach Referral Date: 02/06/21 Referral Source: [facility] hospital liaison Referral Reason: assessments of community needs for care/disease management  Home health was not preferred at hospital discharge Insurance: [employer] ([employer])  Last admission  04/04/21 INTRAMEDULLARY (IM) NAIL HUMERUS (Left Arm Upper) 04/01/21 admitted for surgery for left shoulder - for pre-operative embolization of left proximal humeral met for perioperative blood loss reduction ED visits 03/20/21 ED - left shoulder injury status post (s/p) mechanical fall walking his dog outside Dx lytic lesion within the proximal left humerus complicated by pathologic fracture involving the surgical neck of the humerus with fracture fragments in near anatomic alignment- sent home with pain medicine/current on morphine pump-followed by Dr Olin- 02/28/21 -critical potassium of 6.5-hyperkalemia-, he was being overmedicated with potassium along with also being on losartan and Aldactone. These medications have since been stopped.  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 [facility] RN CM left HIPAA (Health Insurance Portability and Accountability Act) compliant voicemail message along with CM's contact info.   Plan: [facility] RN CM scheduled this patient for another call attempt within 4-7 business days  Kimberly L. Gibbs, RN, BSN, CCM [facility] Telephonic Care Management Care Coordinator Office number [phone] Mobile number [phone]  Main [facility] number [phone] Fax number [phone]  

## 2021-04-09 ENCOUNTER — Institutional Professional Consult (permissible substitution): Payer: Medicare Other | Admitting: Pulmonary Disease

## 2021-04-10 ENCOUNTER — Encounter (HOSPITAL_COMMUNITY): Payer: Self-pay | Admitting: Orthopedic Surgery

## 2021-04-12 DIAGNOSIS — E875 Hyperkalemia: Secondary | ICD-10-CM | POA: Diagnosis not present

## 2021-04-14 ENCOUNTER — Other Ambulatory Visit: Payer: Self-pay | Admitting: Oncology

## 2021-04-18 ENCOUNTER — Other Ambulatory Visit: Payer: Self-pay | Admitting: Oncology

## 2021-04-18 ENCOUNTER — Other Ambulatory Visit (HOSPITAL_COMMUNITY): Payer: Self-pay

## 2021-04-18 MED ORDER — AXITINIB 5 MG PO TABS
ORAL_TABLET | Freq: Two times a day (BID) | ORAL | 0 refills | Status: DC
  Filled 2021-04-18: qty 60, 30d supply, fill #0

## 2021-04-19 ENCOUNTER — Other Ambulatory Visit: Payer: Self-pay | Admitting: *Deleted

## 2021-04-19 ENCOUNTER — Other Ambulatory Visit: Payer: Self-pay

## 2021-04-19 DIAGNOSIS — Z4789 Encounter for other orthopedic aftercare: Secondary | ICD-10-CM | POA: Diagnosis not present

## 2021-04-19 NOTE — Patient Outreach (Signed)
[facility]) Care Management  04/19/2021  ALONZA KNISLEY 11-03-1966 [id number]  [facility] follow up outreach to [facility] complex care patient Referral Date: 02/06/21 Referral Source: [facility] hospital liaison Referral Reason: assessments of community needs for care/disease management  Home health was not preferred at hospital discharge Insurance: [place] care [facility])  Last admission 04/04/21 INTRAMEDULLARY (IM) NAIL HUMERUS (Left Arm Upper) 4/11/22admitted for surgery for left shoulder -for pre-operative embolization of left proximal humeral met for perioperative blood loss reduction ED visits 03/20/21 ED - left shoulder injurystatus post (s/p)mechanical fall walking his dog outside Dx lytic lesion within the proximal left humerus complicated by pathologic fracture involving the surgical neck of the humerus with fracture fragments in near anatomic alignment- sent home with pain medicine/current on morphine pump-followed by Dr Alvan Dame- 02/28/21 -critical potassium of 6.5-hyperkalemia-, he was being overmedicated with potassium along with also being on losartan and Aldactone. These medications have since been stopped. 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 Patient gave verbal consent for services.  Mr Faulcon was wished a happy birthday 04/[birth year]21 He reports he is doing ok today and is enjoying sitting out on his porch with his wife, [name] Review of progression since last [facility] outreach and unsuccessful outreach  He confirms he tripped over his dog which lead to a fracture of  his left shoulder and also a tumor was noted at the site with evaluation  He confirms he had 2 surgeries with placement of rod and pins  Today he reports having the staples removed and experienced pain at a level of a 10  This pain has now decreased to a 4-5 since arriving home  He voices that his potassium was also elevated and he was provided suspension to take Mondays, Wednesdays and Fridays to keep it down He reports he returns for further oncology services soon   There was an interruption in the [facility] internet/phone services, causing a disconnection of the call  [facility] RN CM returned a call when the system was resumed but no answer [facility] RN CM left HIPAA ([place] and Accountability Act) compliant voicemail message along with CM's contact info.  Plan Patient agrees to care plan and follow up [facility] RN CM will outreach to Mr Wachter within the next 30 business days Pt encouraged to return a call to [facility] RN CM prn    Joelene Millin L. Lavina Hamman, RN, BSN, [place] Coordinator Office number [phone] Main [facility] number [phone] Fax number [phone]

## 2021-04-22 ENCOUNTER — Other Ambulatory Visit (HOSPITAL_COMMUNITY): Payer: Self-pay

## 2021-04-24 ENCOUNTER — Telehealth: Payer: Self-pay | Admitting: *Deleted

## 2021-04-24 NOTE — Telephone Encounter (Addendum)
Connected with Elvina Mattes spouse Shawna Orleans requesting fax number for return of her FMLA to [employer].  "I can pick it up his next scheduled appointment 05/14/2021 or come by one day on my lunch break."   Response to due date was "Can you e-mail it to me?"    E-mail address confirmed as [email or web address].  E-mail completed.  [employer] mailed to patient address on file.

## 2021-04-29 DIAGNOSIS — E875 Hyperkalemia: Secondary | ICD-10-CM | POA: Diagnosis not present

## 2021-04-30 ENCOUNTER — Other Ambulatory Visit: Payer: Self-pay | Admitting: Cardiovascular Disease

## 2021-05-01 ENCOUNTER — Institutional Professional Consult (permissible substitution): Payer: Medicare Other | Admitting: Pulmonary Disease

## 2021-05-02 ENCOUNTER — Other Ambulatory Visit: Payer: Self-pay | Admitting: *Deleted

## 2021-05-02 NOTE — Patient Outreach (Signed)
[facility]) Care Management  05/02/2021  Keith [name] 10-04-1966 [id number]   [facility] Unsuccessful outreachReferral Date: 02/06/21 Referral Source: [facility] hospital liaison Referral Reason: assessments of community needs for care/disease management  Home health was not preferred at hospital discharge Insurance: [place] care [facility])  Last admission 4/14/22INTRAMEDULLARY (IM) NAIL HUMERUS (Left Arm Upper) 4/11/22admitted for surgery for left shoulder -for pre-operative embolization of left proximal humeral met for perioperative blood loss reduction ED visits 03/20/21 ED - left shoulder injurystatus post (s/p)mechanical fall walking his dog outside Dx lytic lesion within the proximal left humerus complicated by pathologic fracture involving the surgical neck of the humerus with fracture fragments in near anatomic alignment- sent home with pain medicine/current on morphine pump-followed by Dr Alvan Dame- 02/28/21 -critical potassium of 6.5-hyperkalemia-, he was being overmedicated with potassium along with also being on losartan and Aldactone. These medications have since been stopped. 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 [facility] RN CM left HIPAA [facility] Portability and Accountability Act) compliant voicemail message along with CM's contact info.   Plan: [facility] RN CM scheduled this patient for another call attempt within 4-7 business days  Tyla Burgner L. Lavina Hamman, RN, BSN, [place] Coordinator Office number [phone] Mobile number [phone]  Main [facility] number [phone] Fax number [phone]

## 2021-05-08 DIAGNOSIS — N1831 Chronic kidney disease, stage 3a: Secondary | ICD-10-CM | POA: Diagnosis not present

## 2021-05-10 ENCOUNTER — Other Ambulatory Visit: Payer: Self-pay | Admitting: *Deleted

## 2021-05-10 NOTE — Patient Outreach (Signed)
[facility]) Care Management  05/10/2021  NICKOLUS WADDING September 19, 1966 [id number]   [facility] Unsuccessful outreach Referral Date: 02/06/21 Referral Source: [facility] hospital liaison Referral Reason: assessments of community needs for care/disease management  Home health was not preferred at hospital discharge Insurance: [place] care [facility])  Last admission 4/14/22INTRAMEDULLARY (IM) NAIL HUMERUS (Left Arm Upper) 4/11/22admitted for surgery for left shoulder -for pre-operative embolization of left proximal humeral met for perioperative blood loss reduction ED visits 03/20/21 ED - left shoulder injurystatus post (s/p)mechanical fall walking his dog outside Dx lytic lesion within the proximal left humerus complicated by pathologic fracture involving the surgical neck of the humerus with fracture fragments in near anatomic alignment- sent home with pain medicine/current on morphine pump-followed by Dr Alvan Dame- 02/28/21 -critical potassium of 6.5-hyperkalemia-, he was being overmedicated with potassium along with also being on losartan and Aldactone. These medications have since been stopped. 01/31/21-02/06/21 for shortness of breath (sob) Dx acute hypoxic respiratory failure related to pulmonary embolism, acute combined systolic/diastolic congestive Heart Failure (CHF)  Follow up Last successful outreach on 04/19/21 Outreach attempt to the home number  No answer. [facility] RN CM left HIPAA [facility] Portability and Accountability Act) compliant voicemail message along with CM's contact info.   Plan: [facility] RN CM scheduled this patient for another call attempt within 4-7 business days Unsuccessful outreaches on 05/02/21, 05/10/21 Sent unsuccessful outreach letter 05/10/21  Joelene Millin L. Lavina Hamman, RN, BSN, [place] Coordinator Office number [phone] Mobile number [phone]  Main [facility] number [phone] Fax number [phone]

## 2021-05-14 ENCOUNTER — Inpatient Hospital Stay: Payer: Medicare Other | Attending: Oncology

## 2021-05-14 ENCOUNTER — Ambulatory Visit (HOSPITAL_COMMUNITY)
Admission: RE | Admit: 2021-05-14 | Discharge: 2021-05-14 | Disposition: A | Discharge status: Home / Self Care | Payer: Medicare Other | Source: Ambulatory Visit | Attending: Oncology | Admitting: Oncology

## 2021-05-14 ENCOUNTER — Other Ambulatory Visit: Payer: Self-pay

## 2021-05-14 DIAGNOSIS — I700 Atherosclerosis of aorta: Secondary | ICD-10-CM | POA: Diagnosis not present

## 2021-05-14 DIAGNOSIS — K769 Liver disease, unspecified: Secondary | ICD-10-CM | POA: Diagnosis not present

## 2021-05-14 DIAGNOSIS — I2510 Atherosclerotic heart disease of native coronary artery without angina pectoris: Secondary | ICD-10-CM | POA: Diagnosis not present

## 2021-05-14 DIAGNOSIS — J439 Emphysema, unspecified: Secondary | ICD-10-CM | POA: Diagnosis not present

## 2021-05-14 DIAGNOSIS — C649 Malignant neoplasm of unspecified kidney, except renal pelvis: Secondary | ICD-10-CM

## 2021-05-14 DIAGNOSIS — G894 Chronic pain syndrome: Secondary | ICD-10-CM | POA: Diagnosis not present

## 2021-05-14 DIAGNOSIS — C642 Malignant neoplasm of left kidney, except renal pelvis: Secondary | ICD-10-CM | POA: Diagnosis not present

## 2021-05-14 DIAGNOSIS — J432 Centrilobular emphysema: Secondary | ICD-10-CM | POA: Diagnosis not present

## 2021-05-14 DIAGNOSIS — R918 Other nonspecific abnormal finding of lung field: Secondary | ICD-10-CM | POA: Diagnosis not present

## 2021-05-14 DIAGNOSIS — C7951 Secondary malignant neoplasm of bone: Secondary | ICD-10-CM | POA: Insufficient documentation

## 2021-05-14 DIAGNOSIS — K8689 Other specified diseases of pancreas: Secondary | ICD-10-CM | POA: Diagnosis not present

## 2021-05-14 LAB — CBC WITH DIFFERENTIAL (CANCER CENTER ONLY)
Abs Immature Granulocytes: 0.01 10*3/uL (ref 0.00–0.07)
Basophils Absolute: 0.1 10*3/uL (ref 0.0–0.1)
Basophils Relative: 1 %
Eosinophils Absolute: 0.1 10*3/uL (ref 0.0–0.5)
Eosinophils Relative: 1 %
HCT: 35.7 % — ABNORMAL LOW (ref 39.0–52.0)
Hemoglobin: 10.6 g/dL — ABNORMAL LOW (ref 13.0–17.0)
Immature Granulocytes: 0 %
Lymphocytes Relative: 8 %
Lymphs Abs: 0.5 10*3/uL — ABNORMAL LOW (ref 0.7–4.0)
MCH: 25.7 pg — ABNORMAL LOW (ref 26.0–34.0)
MCHC: 29.7 g/dL — ABNORMAL LOW (ref 30.0–36.0)
MCV: 86.7 fL (ref 80.0–100.0)
Monocytes Absolute: 0.7 10*3/uL (ref 0.1–1.0)
Monocytes Relative: 12 %
Neutro Abs: 4.3 10*3/uL (ref 1.7–7.7)
Neutrophils Relative %: 78 %
Platelet Count: 199 10*3/uL (ref 150–400)
RBC: 4.12 MIL/uL — ABNORMAL LOW (ref 4.22–5.81)
RDW: 17.6 % — ABNORMAL HIGH (ref 11.5–15.5)
WBC Count: 5.5 10*3/uL (ref 4.0–10.5)
nRBC: 0 % (ref 0.0–0.2)

## 2021-05-14 LAB — CMP (CANCER CENTER ONLY)
ALT: 12 U/L (ref 0–44)
AST: 16 U/L (ref 15–41)
Albumin: 3.2 g/dL — ABNORMAL LOW (ref 3.5–5.0)
Alkaline Phosphatase: 117 U/L (ref 38–126)
Anion gap: 9 (ref 5–15)
BUN: 19 mg/dL (ref 6–20)
CO2: 26 mmol/L (ref 22–32)
Calcium: 11.3 mg/dL — ABNORMAL HIGH (ref 8.9–10.3)
Chloride: 99 mmol/L (ref 98–111)
Creatinine: 1.39 mg/dL — ABNORMAL HIGH (ref 0.61–1.24)
GFR, Estimated: >60 mL/min (ref >60)
Glucose, Bld: 103 mg/dL — ABNORMAL HIGH (ref 70–99)
Potassium: 5.1 mmol/L (ref 3.5–5.1)
Sodium: 134 mmol/L — ABNORMAL LOW (ref 135–145)
Total Bilirubin: 0.3 mg/dL (ref 0.3–1.2)
Total Protein: 8.4 g/dL — ABNORMAL HIGH (ref 6.5–8.1)

## 2021-05-16 ENCOUNTER — Other Ambulatory Visit: Payer: Self-pay | Admitting: Oncology

## 2021-05-16 ENCOUNTER — Other Ambulatory Visit (HOSPITAL_COMMUNITY): Payer: Self-pay

## 2021-05-16 ENCOUNTER — Telehealth: Payer: Self-pay | Admitting: *Deleted

## 2021-05-16 NOTE — Telephone Encounter (Signed)
I need to see him for refill. No refill for now

## 2021-05-16 NOTE — Telephone Encounter (Signed)
Please see refill.

## 2021-05-16 NOTE — Telephone Encounter (Signed)
Mr Carruthers called to say he needs a refill of Inlyta. Will pick up next week after he sees [name]

## 2021-05-17 ENCOUNTER — Other Ambulatory Visit: Payer: Self-pay | Admitting: *Deleted

## 2021-05-17 ENCOUNTER — Telehealth: Payer: Self-pay | Admitting: *Deleted

## 2021-05-17 ENCOUNTER — Other Ambulatory Visit: Payer: Self-pay | Admitting: Oncology

## 2021-05-17 MED ORDER — ALPRAZOLAM 1 MG PO TABS: ORAL_TABLET | ORAL | 0 refills | Status: DC

## 2021-05-17 NOTE — Telephone Encounter (Signed)
Mr Warga called for a refill of Xanax

## 2021-05-17 NOTE — Patient Outreach (Signed)
[facility]) Care Management  05/17/2021  Keith [name] August 26, 1966 [id number]   [facility] third Unsuccessful outreach Referral Date: 02/06/21 Referral Source: [facility] hospital liaison Referral Reason: assessments of community needs for care/disease management  Home health was not preferred at hospital discharge Insurance: [place] care [facility])  Last admission 4/14/22INTRAMEDULLARY (IM) NAIL HUMERUS (Left Arm Upper) 4/11/22admitted for surgery for left shoulder -for pre-operative embolization of left proximal humeral met for perioperative blood loss reduction ED visits 03/20/21 ED - left shoulder injurystatus post (s/p)mechanical fall walking his dog outside Dx lytic lesion within the proximal left humerus complicated by pathologic fracture involving the surgical neck of the humerus with fracture fragments in near anatomic alignment- sent home with pain medicine/current on morphine pump-followed by Dr Alvan Dame- 02/28/21 -critical potassium of 6.5-hyperkalemia-, he was being overmedicated with potassium along with also being on losartan and Aldactone. These medications have since been stopped. 01/31/21-02/06/21 for shortness of breath (sob) Dx acute hypoxic respiratory failure related to pulmonary embolism, acute combined systolic/diastolic congestive Heart Failure (CHF)  Follow up Last successful outreach on 04/19/21 Outreach attempt to the home number  No answer. [facility] RN CM left HIPAA [facility] Portability and Accountability Act) compliant voicemail message along with CM's contact info.   Plan: [facility] RN CM scheduled this patient for case closure per [facility] workflow pending a return call from patient Unsuccessful outreaches on 05/02/21, 05/10/21, 05/17/21  Sent unsuccessful outreach letter 05/10/21  Joelene Millin L. Lavina Hamman, RN, BSN, [place] Coordinator Office number [phone] Mobile number [phone]  Main [facility] number  [phone] Fax number [phone]

## 2021-05-21 ENCOUNTER — Other Ambulatory Visit: Payer: Self-pay

## 2021-05-21 ENCOUNTER — Inpatient Hospital Stay: Payer: Medicare Other | Admitting: Oncology

## 2021-05-21 ENCOUNTER — Telehealth: Payer: Self-pay | Admitting: Pharmacist

## 2021-05-21 ENCOUNTER — Other Ambulatory Visit (HOSPITAL_COMMUNITY): Payer: Self-pay

## 2021-05-21 ENCOUNTER — Other Ambulatory Visit: Payer: Self-pay | Admitting: Oncology

## 2021-05-21 ENCOUNTER — Inpatient Hospital Stay: Payer: Medicare Other

## 2021-05-21 VITALS — BP 138/89 | HR 71 | Temp 96.2°F | Resp 18 | Wt 154.4 lb

## 2021-05-21 DIAGNOSIS — C7951 Secondary malignant neoplasm of bone: Secondary | ICD-10-CM | POA: Diagnosis not present

## 2021-05-21 DIAGNOSIS — C649 Malignant neoplasm of unspecified kidney, except renal pelvis: Secondary | ICD-10-CM

## 2021-05-21 DIAGNOSIS — C642 Malignant neoplasm of left kidney, except renal pelvis: Secondary | ICD-10-CM | POA: Diagnosis not present

## 2021-05-21 MED ORDER — ZOLEDRONIC ACID 4 MG/100ML IV SOLN
4.0000 mg | Freq: Once | INTRAVENOUS | Status: AC
  Administered 2021-05-21: 4 mg via INTRAVENOUS

## 2021-05-21 MED ORDER — ZOLEDRONIC ACID 4 MG/100ML IV SOLN
INTRAVENOUS | Status: AC
  Filled 2021-05-21: qty 100

## 2021-05-21 MED ORDER — SODIUM CHLORIDE 0.9 % IV SOLN
Freq: Once | INTRAVENOUS | Status: AC
  Filled 2021-05-21: qty 250

## 2021-05-21 MED ORDER — LENVATINIB (20 MG DAILY DOSE) 2 X 10 MG PO CPPK
20.0000 mg | ORAL_CAPSULE | Freq: Every day | ORAL | 1 refills | Status: DC
  Filled 2021-05-21 – 2021-05-23 (×2): qty 60, 30d supply, fill #0
  Filled 2021-06-13: qty 60, 30d supply, fill #1

## 2021-05-21 NOTE — Progress Notes (Signed)
START OFF PATHWAY REGIMEN - Renal Cell   [id number]:Pembrolizumab 200 mg IV D1 q21 Days:   A cycle is every 21 days:     Pembrolizumab   **Always confirm dose/schedule in your pharmacy ordering system**  Patient Characteristics: Stage IV/Metastatic Disease, Clear Cell, Third Line and Beyond, No Prior Checkpoint Inhibitor and Candidate for Immunotherapy Therapeutic Status: Stage IV/Metastatic Disease Histology: Clear Cell Line of Therapy: Third Line and Beyond Intent of Therapy: Non-Curative / Palliative Intent, Discussed with Patient

## 2021-05-21 NOTE — Telephone Encounter (Signed)
Oral Oncology Pharmacist Encounter  Received new prescription for Lenvima (lenvatinib) for the treatment of metastatic clear-cell renal cell carcinoma in conjunction with pembrolizumab, planned duration until disease progression or unacceptable drug toxicity.  Prescription dose and frequency assessed for appropriateness.  CBC w/ Diff and CMP from 05/14/21 assessed, noted Scr 1.39 mg/dL (CrCl ~60 mL/min) - no renal dose adjustments required at this time. Corrected calcium ~11.6 mg/dL - pt to receive zometa 05/21/21. BP stable at 138/89 mmHg, recommend continued monitoring of BP due to risk of HTN with Lenvima.   Current medication list in Epic reviewed, DDIs with Lenvima identified:  Category D DDI between [place]ena[place] and ondansetron due to risk of QTc prolongation. Noted patient only prescribed ondansetron PRN, last EKG available from 02/28/21 QTc WNL (427 ms)  Evaluated chart and no patient barriers to medication adherence noted.   Patient agreement for treatment documented in MD note on 05/21/21.  Prescription has been e-scribed to the [facility] for benefits analysis and approval.  Oral Oncology Clinic will continue to follow for insurance authorization, copayment issues, initial counseling and start date.  Leron Croak, PharmD, BCPS Hematology/Oncology Clinical Pharmacist [facility] [phone] 05/21/2021 2:25 PM

## 2021-05-21 NOTE — Progress Notes (Signed)
Hematology and Oncology Follow Up   Keith [name] [id number] 12-May-1966 55 y.o. 05/21/2021 1:18 PM Keith [name], MDPharr, Keith Jew, MD       Principle Diagnosis: 55 year old man with kidney cancer diagnosed in 2009.  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  He has also pancreatic involvement.  Prior Therapy: 1. Status post laparoscopic radical nephrectomy.  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  2. Patient status post thoracotomy for a synchronous metastatic lung lesions done October 2009.   3. Patient is status post stereotactic radiotherapy to pulmonary nodules in May of 2010. 4. He is S/P Sutent 50 mg 4 weeks on 2 weeks off from 10/2010 to 03/2013. He progressed at that time.  5. He is S/P radiation to the right sacral bone between 4/22 to 4/30.  6. He is S/P XRT to the left shoulder 03/20/14 to 03/31/14. 7. Votrient 800 mg by mouth daily from 03/2013 through 06/22/2015. Discontinued secondary to disease progression. 8. Nivolumab 3 mg/kg given every 2 weeks started on 06/29/2015. He is status post 4 cycles completed 08/10/2015. He developed disease progression in September 2016.  9. Status post radiation therapy to the left mid fibula completed on 11/14/2015. He received a grade 1 fraction. 10. He is status post radiation to the proximal and distal femur over 2 weeks and 10 fractions of total of 30 Gy.  This was completed in March 2019. 11.   Radiation therapy to the right tibia.  Last treatment completed in September 2019. 12. Cabometyx 60 mg daily started in November 2016.  Therapy discontinued and February 2022 due to progression of disease. 13.  Axitinib 5 mg twice a day started in March 2022.  Therapy discontinued in June 2022 due to progression of disease.   Current therapy: Under consideration to start different therapy.  Interim History: Keith [name] returns today for repeat evaluation.  Since  the last visit, he underwent surgical fixation of a left humerus after sustaining a pathological fracture on April 04, 2021.  Since his surgery, he reports feeling well and denies any further complaints or pain related to that arm.  He does have limited range of motion at this time.  His appetite has improved and he is eating well although he lost more weight.  Denies any nausea, vomiting or abdominal pain.  He denies any worsening fatigue or tiredness.    Medications: Unchanged on review. Current Outpatient Medications  Medication Sig Dispense Refill  . acetaminophen (TYLENOL) 500 MG tablet Take 500 mg by mouth every 6 (six) hours as needed for moderate pain or mild pain.    [name] ALPRAZolam (XANAX) 1 MG tablet Take 1 tablet by mouth every 8 hours as needed. 90 tablet 0  . axitinib (INLYTA) 5 MG tablet TAKE 1 TABLET (5 MG TOTAL) BY MOUTH 2 (TWO) TIMES DAILY. 60 tablet 0  . carvedilol (COREG) 12.5 MG tablet Take 1 tablet (12.5 mg total) by mouth 2 (two) times daily with a meal. 60 tablet 2  . Cyanocobalamin (B-12) 1000 MCG TABS Take 1 tablet (1,000 mcg total) by mouth daily. (Patient not taking: Reported on 03/27/2021) 30 tablet   . diphenhydrAMINE (BENADRYL) 25 MG tablet Take 25 mg by mouth daily.    . diphenhydrAMINE (SOMINEX) 25 MG tablet Take 25 mg by mouth.    . DULoxetine (CYMBALTA) 60 MG capsule Take 60 mg by mouth.    . ferrous sulfate 325 (65 FE)  MG tablet TAKE 1 TABLET BY MOUTH EVERY DAY WITH BREAKFAST (Patient taking differently: Take 325 mg by mouth daily.) 90 tablet 2  . furosemide (LASIX) 20 MG tablet TAKE 1 TABLET BY MOUTH EVERY DAY 90 tablet 2  . gabapentin (NEURONTIN) 300 MG capsule TAKE 1 CAPSULE BY MOUTH THREE TIMES A DAY 270 capsule 3  . Glycerin-Hypromellose-PEG 400 ([facility] DRY EYE RELIEF) 0.2-0.2-1 % SOLN Place 1 drop into both eyes daily as needed (Dry eye).    . hydrALAZINE (APRESOLINE) 25 MG tablet Take 1 tablet (25 mg total) by mouth 3 (three) times daily. 270 tablet 3  .  HYDROmorphone (DILAUDID) 8 MG tablet Take 8 mg by mouth 3 (three) times daily.    . Magnesium Oxide 400 MG CAPS Take 1 capsule (400 mg total) by mouth daily. 7 capsule 0  . ondansetron (ZOFRAN) 4 MG tablet Take one tablet every morning.  Can take one tablet later in the day on an as needed basis (Patient taking differently: Take one tablet every morning.  Can take one tablet later in the day on an as needed basis) 50 tablet 11  . PAIN MANAGEMENT INTRATHECAL, IT, PUMP 1 each by Intrathecal route continuous. Intrathecal (IT) medication:   Morphine.  Can push every 4 hours    . pantoprazole (PROTONIX) 40 MG tablet Take 1 tablet (40 mg total) by mouth daily. 30 tablet 3  . Sodium Polystyrene Sulfonate (KAYEXALATE PO) Take 15 g by mouth.    . SPS 15 GM/60ML suspension Take 15 g by mouth.    [name] tiZANidine (ZANAFLEX) 4 MG tablet Take 4 mg by mouth 3 (three) times daily.     No current facility-administered medications for this visit.     Allergies:  Allergies  Allergen Reactions  . Ceftriaxone Hives    Other reaction(s): red streaks Other reaction(s): red streaks  . Hydrocodone Swelling    Physical examination:   Blood pressure 138/89, pulse 71, temperature (!) 96.2 F (35.7 C), temperature source Tympanic, resp. rate 18, weight 154 lb 6.4 oz (70 kg), SpO2 95 %.     ECOG 2    General appearance: Comfortable appearing without any discomfort Head: Normocephalic without any trauma Oropharynx: Mucous membranes are moist and pink without any thrush or ulcers. Eyes: Pupils are equal and round reactive to light. Lymph nodes: No cervical, supraclavicular, inguinal or axillary lymphadenopathy.   Heart:regular rate and rhythm.  S1 and S2 without leg edema. Lung: Clear without any rhonchi or wheezes.  No dullness to percussion. Abdomin: Soft, nontender, nondistended with good bowel sounds.  No hepatosplenomegaly. Musculoskeletal: No joint deformity or effusion.  Full range of motion  noted. Neurological: No deficits noted on motor, sensory and deep tendon reflex exam. Skin: No petechial rash or dryness.  Appeared moist.                  Lab Results: Lab Results  Component Value Date   WBC 5.5 05/14/2021   HGB 10.6 (L) 05/14/2021   HCT 35.7 (L) 05/14/2021   MCV 86.7 05/14/2021   PLT 199 05/14/2021     Chemistry      Component Value Date/Time   NA 134 (L) 05/14/2021 1043   NA 138 02/27/2021 1102   NA 142 12/24/2017 1317   K 5.1 05/14/2021 1043   K 4.3 12/24/2017 1317   CL 99 05/14/2021 1043   CL 106 05/24/2013 1436   CO2 26 05/14/2021 1043   CO2 32 (H) 12/24/2017 1317  BUN 19 05/14/2021 1043   BUN 28 (H) 02/27/2021 1102   BUN 10.3 12/24/2017 1317   CREATININE 1.39 (H) 05/14/2021 1043   CREATININE 0.9 12/24/2017 1317   GLU 160 (H) 06/29/2015 0957      Component Value Date/Time   CALCIUM 11.3 (H) 05/14/2021 1043   CALCIUM 8.8 12/24/2017 1317   ALKPHOS 117 05/14/2021 1043   ALKPHOS 95 12/24/2017 1317   AST 16 05/14/2021 1043   AST 20 12/24/2017 1317   ALT 12 05/14/2021 1043   ALT 25 12/24/2017 1317   BILITOT 0.3 05/14/2021 1043   BILITOT 0.25 12/24/2017 1317      IMPRESSION: 1. Interval development of 1, possibly 2 low-density lesions in the liver, compatible with metastatic disease. Possible second 2. Interval progression of a soft tissue nodule anterior to the body of pancreas with similar soft tissue fullness in the pancreatic body obstructing the main pancreatic duct. 3. No substantial change in bilateral irregular pulmonary nodules. 4. Tree-in-bud nodularity in the left lower lobe suggests atypical infection. 5. Small volume free fluid in the pelvis. 6. Stable appearance of bony metastatic involvement. 7. Aortic Atherosclerosis (ICD10-I70.0) and Emphysema (ICD10-J43.9).  Impression and Plan:   55 year old man with  1.    Kidney cancer diagnosed in 2009.  He presented with stage IV clear-cell subtype with pulmonary  involvement.  He subsequently developed pancreatic and bone disease.   Has disease status was updated at this time including review of his imaging studies.  His scans including chest abdomen and pelvis completed on May 24 showed progression of disease with development of hepatic metastasis as well as interval progression of his pancreatic lesions.  He has also developed more bone involvement at this time.  Treatment options moving forward at this time were discussed.  I would favor discontinuation of axitinib given the rapid disease progression noted at this time.  Different salvage therapy including ipilimumab and nivolumab combination versus Tivozanib versus combination of Pembrolizumab and lenvatinib were reviewed.  Complication associated with these treatments were reviewed at this time including immune mediated complications, skin rash, diarrhea and anorexia.  After discussion we have opted to proceed with lenvatinib and Pembrolizumab to start in the near future.  2.  Pulmonary embolism: Imaging studies did not show any further thrombosis at this time.  He is off Xarelto due to GI bleeding.  3.  Anemia: His hemoglobin is stable without any further decline.  4.    Lower extremity edema: Continues to improve on diuretics.  Resolved at this time.  5.  Prognosis: Overall guarded given his overall disease progression multiple treatments previously.   6.  Hypercalcemia: Related to malignancy and metastatic disease to the bone.  Risks and benefits of starting Zometa were discussed at this time.  Complications that include nausea, fatigue and infusion related complications.  Hypocalcemia and osteonecrosis of the jaw also be a consideration.  This can be repeated every 3 months if needed to.  7.  Pathological fracture of the left humerus: Status post surgical fixation.  Pain is manageable and range of motion is limited.  He is participating physical therapy.   8.  Follow-up: We will be in the  near future to start therapy.   40  minutes were spent on this encounter.  The time was dedicated to reviewing laboratory data, reviewing imaging studies, discussing treatment options, addressing complications related to his cancer and cancer therapy.  Zola Button, MD 05/21/2021 1:18 PM

## 2021-05-21 NOTE — Patient Instructions (Signed)
Zoledronic Acid Injection (Hypercalcemia, Oncology) What is this medicine? ZOLEDRONIC ACID (ZOE le dron ik AS id) slows calcium loss from bones. It high calcium levels in the blood from some kinds of cancer. It may be used in other people at risk for bone loss. This medicine may be used for other purposes; ask your health care provider or pharmacist if you have questions. COMMON BRAND NAME(S): Zometa What should I tell my health care provider before I take this medicine? They need to know if you have any of these conditions:  cancer  dehydration  dental disease  kidney disease  liver disease  low levels of calcium in the blood  lung or breathing disease (asthma)  receiving steroids like dexamethasone or prednisone  an unusual or allergic reaction to zoledronic acid, other medicines, foods, dyes, or preservatives  pregnant or trying to get pregnant  breast-feeding How should I use this medicine? This drug is injected into a vein. It is given by a health care provider in a hospital or clinic setting. Talk to your health care provider about the use of this drug in children. Special care may be needed. Overdosage: If you think you have taken too much of this medicine contact a poison control center or emergency room at once. NOTE: This medicine is only for you. Do not share this medicine with others. What if I miss a dose? Keep appointments for follow-up doses. It is important not to miss your dose. Call your health care provider if you are unable to keep an appointment. What may interact with this medicine?  certain antibiotics given by injection  NSAIDs, medicines for pain and inflammation, like ibuprofen or naproxen  some diuretics like bumetanide, furosemide  teriparatide  thalidomide This list may not describe all possible interactions. Give your health care provider a list of all the medicines, herbs, non-prescription drugs, or dietary supplements you use. Also tell  them if you smoke, drink alcohol, or use illegal drugs. Some items may interact with your medicine. What should I watch for while using this medicine? Visit your health care provider for regular checks on your progress. It may be some time before you see the benefit from this drug. Some people who take this drug have severe bone, joint, or muscle pain. This drug may also increase your risk for jaw problems or a broken thigh bone. Tell your health care provider right away if you have severe pain in your jaw, bones, joints, or muscles. Tell you health care provider if you have any pain that does not go away or that gets worse. Tell your dentist and dental surgeon that you are taking this drug. You should not have major dental surgery while on this drug. See your dentist to have a dental exam and fix any dental problems before starting this drug. Take good care of your teeth while on this drug. Make sure you see your dentist for regular follow-up appointments. You should make sure you get enough calcium and vitamin D while you are taking this drug. Discuss the foods you eat and the vitamins you take with your health care provider. Check with your health care provider if you have severe diarrhea, nausea, and vomiting, or if you sweat a lot. The loss of too much body fluid may make it dangerous for you to take this drug. You may need blood work done while you are taking this drug. Do not become pregnant while taking this drug. Women should inform their health care provider  if they wish to become pregnant or think they might be pregnant. There is potential for serious harm to an unborn child. Talk to your health care provider for more information. What side effects may I notice from receiving this medicine? Side effects that you should report to your doctor or health care provider as soon as possible:  allergic reactions (skin rash, itching or hives; swelling of the face, lips, or tongue)  bone  pain  infection (fever, chills, cough, sore throat, pain or trouble passing urine)  jaw pain, especially after dental work  joint pain  kidney injury (trouble passing urine or change in the amount of urine)  low blood pressure (dizziness; feeling faint or lightheaded, falls; unusually weak or tired)  low calcium levels (fast heartbeat; muscle cramps or pain; pain, tingling, or numbness in the hands or feet; seizures)  low magnesium levels (fast, irregular heartbeat; muscle cramp or pain; muscle weakness; tremors; seizures)  low red blood cell counts (trouble breathing; feeling faint; lightheaded, falls; unusually weak or tired)  muscle pain  redness, blistering, peeling, or loosening of the skin, including inside the mouth  severe diarrhea  swelling of the ankles, feet, hands  trouble breathing Side effects that usually do not require medical attention (report to your doctor or health care provider if they continue or are bothersome):  anxious  constipation  coughing  depressed mood  eye irritation, itching, or pain  fever  general ill feeling or flu-like symptoms  nausea  pain, redness, or irritation at site where injected  trouble sleeping This list may not describe all possible side effects. Call your doctor for medical advice about side effects. You may report side effects to FDA at 1-800-FDA-1088. Where should I keep my medicine? This drug is given in a hospital or clinic. It will not be stored at home. NOTE: This sheet is a summary. It may not cover all possible information. If you have questions about this medicine, talk to your doctor, pharmacist, or health care provider.  2021 [employer]vier/Gold Standard (2019-09-22 09:13:00)

## 2021-05-22 ENCOUNTER — Other Ambulatory Visit (HOSPITAL_COMMUNITY): Payer: Self-pay

## 2021-05-23 ENCOUNTER — Other Ambulatory Visit (HOSPITAL_COMMUNITY): Payer: Self-pay

## 2021-05-23 DIAGNOSIS — N1831 Chronic kidney disease, stage 3a: Secondary | ICD-10-CM | POA: Diagnosis not present

## 2021-05-23 DIAGNOSIS — G894 Chronic pain syndrome: Secondary | ICD-10-CM | POA: Diagnosis not present

## 2021-05-23 DIAGNOSIS — G620 Drug-induced polyneuropathy: Secondary | ICD-10-CM | POA: Diagnosis not present

## 2021-05-23 DIAGNOSIS — C642 Malignant neoplasm of left kidney, except renal pelvis: Secondary | ICD-10-CM | POA: Diagnosis not present

## 2021-05-23 DIAGNOSIS — Z9689 Presence of other specified functional implants: Secondary | ICD-10-CM | POA: Diagnosis not present

## 2021-05-23 NOTE — Telephone Encounter (Signed)
Oral Chemotherapy Pharmacist Encounter  I spoke with patient's wife Keith [name] for overview of: Lenvima (lenvatinib) for the treatment of metastatic clear-cell renal cell carcinoma in conjunction with pembrolizumab, planned duration until disease progression or unacceptable toxicity.   Counseled on administration, dosing, side effects, monitoring, drug-food interactions, safe handling, storage, and disposal.  Patient will take Lenvima 10mg  capsules, 2 capsules (20mg  total) by mouth once daily, with or without food, at approximately the same time each day.  Lenvima start date: 05/26/21  Adverse effects include but are not limited to: hypertension, hand-foot syndrome, diarrhea, joint pain, fatigue, headache, decreased calcium, proteinuria, increased risk of blood clots, and cardiac conduction issues.   Patient will obtain anti diarrheal and alert the office of 4 or more loose stools above baseline.  Patient's wife instructed to notify office of any upcoming invasive procedures.  Keith [name] will be held for 6 days prior to scheduled surgery, restart based on healing and clinical judgement.   Reviewed importance of keeping a medication schedule and plan for any missed doses. No barriers to medication adherence identified.  Medication reconciliation performed and medication/allergy list updated. Patient's last dose of Inlyta was 05/23/21 AM - patient will stop taking it at this time so he can start [place] on 05/26/21.  Insurance authorization for Keith [name] has been obtained. This will ship from the [facility] on 05/23/21 to deliver to patient's home on 05/24/21.  Patient's wife informed the pharmacy will reach out 5-7 days prior to needing next fill of Lenvima to coordinate continued medication acquisition to prevent break in therapy.  All questions answered.  Ms. Roes voiced understanding and appreciation.   Medication education handout placed in mail for patient. Patient and wife  know to call the office with questions or concerns. Oral Chemotherapy Clinic phone number provided to patient.   Leron Croak, PharmD, BCPS Hematology/Oncology Clinical Pharmacist [facility] [phone] 05/23/2021 1:20 PM

## 2021-05-25 ENCOUNTER — Other Ambulatory Visit: Payer: Self-pay | Admitting: Oncology

## 2021-05-27 ENCOUNTER — Encounter: Payer: Self-pay | Admitting: Oncology

## 2021-05-30 NOTE — Progress Notes (Signed)
Cardiology Office Note:   Date:  05/31/2021  NAME:  Keith [name]    MRN: [id number] DOB:  Nov 22, 1966   PCP:  [name], MD  Cardiologist:  Evalina Field, MD  Electrophysiologist:  None   Referring MD: [name], MD   Chief Complaint  Patient presents with   Follow-up   History of Present Illness:   Keith [name] is a 55 y.o. male with a hx of HFpEF, Stage IV renal CA, recurrent GI bleed/anemia, DM, CKD III who presents for follow-up. Had a pathologic femur fracture that required surgery. Cancer continues to progress and he is on salvage chemo\.  He reports he is doing well.  He had an episode of low blood pressure that was related with stress.  Has had no further episodes.  Apparently this was related to his brother living with him.  He is on Lasix 20 mg daily.  Symptoms appear to be nonexistent.  He has no increased lower extremity edema.  I wonder if his low blood pressure episode was related to overdiuresis.  We discussed taking it every other day.  He remains on Coreg 12.5 twice daily.  BP 128/74.  No chest pain or trouble breathing.  He will start a new chemotherapeutic agent soon.  Overall he is doing well.  Problem List 1. Stage IV Renal Cancer -radical nephrectomy 07/2008 -metastatic lung lesions s/p thoracotomy/radiation -XRT to sacrum/shoulder/femur  -recurrence with salvage chemotherapy  2. PE -no candidate for AC due to GI bleed/anemia  3. Recurrent GI Bleed/A[place]ima -recurrent transfusions 4. Diabetes 5. Systolic HF -EF 54-65% 6. CKD III 7. 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)     Past Surgical History: Past Surgical History:  Procedure Laterality Date   BIOPSY  08/28/2019   Procedure: BIOPSY;  Surgeon: Thornton Park, MD;  [facility];  Service: Gastroenterology;;   BIOPSY  03/01/2020   Procedure: BIOPSY;  Surgeon: Mauri Pole, MD;  [facility];  Service: Endoscopy;;   BIOPSY  12/19/2020   Procedure: BIOPSY;  Surgeon: Yetta Flock, MD;  [facility];  Service: Gastroenterology;;   BIOPSY  12/20/2020   Procedure: BIOPSY;  Surgeon: Gatha Mayer, MD;  [facility];  Service: Endoscopy;;   COLONOSCOPY W/ POLYPECTOMY     COLONOSCOPY WITH PROPOFOL N/A 03/01/2020   Procedure: COLONOSCOPY WITH PROPOFOL;  Surgeon: Mauri Pole, MD;  [facility];  Service: Endoscopy;  Laterality: N/A;   COLONOSCOPY WITH PROPOFOL N/A 12/20/2020   Procedure: COLONOSCOPY WITH PROPOFOL;  Surgeon: Gatha Mayer, MD;  [facility];  Service: Endoscopy;  Laterality: N/A;   ESOPHAGOGASTRODUODENOSCOPY (EGD) WITH PROPOFOL N/A 08/28/2019   Procedure: ESOPHAGOGASTRODUODENOSCOPY (EGD) WITH PROPOFOL;  Surgeon: Thornton Park, MD;  [facility];  Service: Gastroenterology;  Laterality: N/A;   ESOPHAGOGASTRODUODENOSCOPY (EGD) WITH PROPOFOL N/A 03/01/2020   Procedure: ESOPHAGOGASTRODUODENOSCOPY (EGD) WITH PROPOFOL;  Surgeon: Mauri Pole, MD;  [facility];  Service: Endoscopy;  Laterality: N/A;   ESOPHAGOGASTRODUODENOSCOPY (EGD) WITH PROPOFOL N/A  12/19/2020   Procedure: ESOPHAGOGASTRODUODENOSCOPY (EGD) WITH PROPOFOL;  Surgeon: Yetta Flock, MD;  [facility];  Service: Gastroenterology;  Laterality: N/A;   FEMUR IM NAIL Right 02/16/2018   Procedure: INTRAMEDULLARY (IM) NAIL RIGHT FEMORAL;  Surgeon: Paralee Cancel, MD;  [facility];  Service: Orthopedics;  Laterality: Right;   GIVENS CAPSULE STUDY N/A 03/03/2020   Procedure: GIVENS CAPSULE  STUDY;  Surgeon: Mauri Pole, MD;  [facility];  Service: Endoscopy;  Laterality: N/A;   HAND SURGERY Right    HEMOSTASIS CLIP PLACEMENT  12/20/2020   Procedure: HEMOSTASIS CLIP PLACEMENT;  Surgeon: Gatha Mayer, MD;  [facility];  Service: Endoscopy;;   HOT HEMOSTASIS N/A 12/20/2020   Procedure: HOT HEMOSTASIS (ARGON PLASMA COAGULATION/BICAP);  Surgeon: Gatha Mayer, MD;  Location[facility];  Service: Endoscopy;  Laterality: N/A;   HUMERUS IM NAIL Left 04/04/2021   Procedure: INTRAMEDULLARY (IM) NAIL HUMERUS;  Surgeon: Nicholes Stairs, MD;  Location: [facility];  Service: Orthopedics;  Laterality: Left;  2 hrs Need  Interventional radiology for immobilization of renal cell tumor- to be done same day prior to [place] LEFT  04/04/2021   IR ANGIOGRAM EXTREMITY RIGHT  02/15/2018   IR ANGIOGRAM SELECTIVE EACH ADDITIONAL VESSEL  02/15/2018   IR ANGIOGRAM SELECTIVE EACH ADDITIONAL VESSEL  02/15/2018   IR ANGIOGRAM SELECTIVE EACH ADDITIONAL VESSEL  02/15/2018   IR ANGIOGRAM SELECTIVE EACH ADDITIONAL VESSEL  02/15/2018   IR ANGIOGRAM SELECTIVE EACH ADDITIONAL VESSEL  04/04/2021   IR ANGIOGRAM SELECTIVE EACH ADDITIONAL VESSEL  04/04/2021   IR EMBO TUMOR ORGAN ISCHEMIA INFARCT INC GUIDE ROADMAPPING  02/15/2018   IR EMBO TUMOR ORGAN ISCHEMIA INFARCT INC GUIDE ROADMAPPING  02/15/2018   IR EMBO TUMOR ORGAN ISCHEMIA INFARCT INC GUIDE ROADMAPPING  04/04/2021   IR US GUIDE VASC ACCESS LEFT  02/15/2018   IR US GUIDE VASC ACCESS RIGHT  04/04/2021   LUNG REMOVAL, PARTIAL Right 09/2008   NEPHRECTOMY RADICAL     Left    PAIN PUMP IMPLANTATION N/A 05/16/2015   Procedure: Intrathecal pain pump placement;  Surgeon: Clydell Hakim, MD;  Location: [facility];  Service: Neurosurgery;  Laterality: N/A;  Intrathecal pain pump placement    Current Medications: Current Meds  Medication Sig   acetaminophen (TYLENOL) 500 MG tablet Take 500 mg by mouth every 6 (six)  hours as needed for moderate pain or mild pain.   ALPRAZolam (XANAX) 1 MG tablet Take 1 tablet by mouth every 8 hours as needed.   carvedilol (COREG) 12.5 MG tablet Take 1 tablet (12.5 mg total) by mouth 2 (two) times daily with a meal.   Cyanocobalamin (B-12) 1000 MCG TABS Take 1 tablet (1,000 mcg total) by mouth daily.   diphenhydrAMINE (BENADRYL) 25 MG tablet Take 25 mg by mouth daily.   diphenhydrAMINE (SOMINEX) 25 MG tablet Take 25 mg by mouth.   DULoxetine (CYMBALTA) 60 MG capsule Take 60 mg by mouth.   ferrous sulfate 325 (65 FE) MG tablet TAKE 1 TABLET BY MOUTH EVERY DAY WITH BREAKFAST (Patient taking differently: Take 325 mg by mouth daily.)   gabapentin (NEURONTIN) 300 MG capsule TAKE 1 CAPSULE BY MOUTH THREE TIMES A DAY   Glycerin-Hypromellose-PEG 400 ([employer] DRY EYE RELIEF) 0.2-0.2-1 % SOLN Place 1 drop into both eyes daily as needed (Dry eye).   HYDROmorphone (DILAUDID) 8 MG tablet Take 8 mg by mouth 3 (three) times daily.   lenvatinib 20 mg daily dose (LENVIMA)  2 x 10 MG capsule Take 2 capsules (20 mg total) by mouth daily.   Magnesium Oxide 400 MG CAPS Take 1 capsule (400 mg total) by mouth daily.   ondansetron (ZOFRAN) 4 MG tablet Take one tablet every morning.  Can take one tablet later in the day on an as needed basis (Patient taking differently: Take one tablet every morning.  Can take one tablet later in the day on an as needed basis)   PAIN MANAGEMENT INTRATHECAL, IT, PUMP 1 each by Intrathecal route continuous. Intrathecal (IT) medication:   Morphine.  Can push every 4 hours   pantoprazole (PROTONIX) 40 MG tablet Take 1 tablet (40 mg total) by mouth daily.   Sodium Polystyrene Sulfonate (KAYEXALATE PO) Take 15 g by mouth.   SPS 15 GM/60ML suspension Take 15 g by mouth.   tiZANidine (ZANAFLEX) 4 MG tablet Take 4 mg by mouth 3 (three) times daily.   VELTASSA 8.4 g packet Take 1 packet by mouth daily.   [DISCONTINUED] furosemide (LASIX) 20 MG tablet TAKE 1 TABLET BY MOUTH EVERY  DAY     Allergies:    Ceftriaxone and Hydrocodone   Social History: Social History   Socioeconomic History   Marital status: Married    Spouse name: [name]   Number of children: 2   Years of education: Not on file   Highest education level: Not on file  Occupational History   Occupation: Clinical biochemist   Tobacco Use   Smoking status: Every Day    Packs/day: 2.00    Years: 30.00    Pack years: 60.00    Types: Cigarettes   Smokeless tobacco: Never  Vaping Use   Vaping Use: Never used  Substance and Sexual Activity   Alcohol use: No   Drug use: Not Currently    Types: Marijuana    Comment: Last use was on Sunday 03/31/21   Sexual activity: Not Currently  Other Topics Concern   Not on file  Social History Narrative   6 caffeine drinks daily    08-11-18  Unable to ask abuse questions wife with him today.   Social Determinants of Health   Financial Resource Strain: Not on file  Food Insecurity: No Food Insecurity   Worried About Charity fundraiser in the Last Year: Never true   Ran Out of Food in the Last Year: Never true  Transportation Needs: No Transportation Needs   Lack of Transportation (Medical): No   Lack of Transportation (Non-Medical): No  Physical Activity: Not on file  Stress: Not on file  Social Connections: Not on file     Family History: The patient's family history includes Cancer in his maternal aunt; Diabetes in his brother; Hypertension in his mother; Irritable bowel syndrome in his sister. There is no history of Colon cancer.  ROS:   All other ROS reviewed and negative. Pertinent positives noted in the HPI.     EKGs/Labs/Other Studies Reviewed:   The following studies were personally reviewed by me today:   TTE 02/01/2021  1. Left ventricular ejection fraction, by estimation, is 40 to 45%. The  left ventricle has mild to moderately decreased function. The left  ventricle demonstrates global hypokinesis. There is mild left ventricular   hypertrophy. Left ventricular diastolic  parameters are consistent with Grade I diastolic dysfunction (impaired  relaxation).   2. Right ventricular systolic function is normal. The right ventricular  size is normal. Tricuspid regurgitation signal is inadequate for assessing  PA pressure.  3. Mild diastolic collapse.   4. A small pericardial effusion is present. The pericardial effusion is  circumferential. There is no evidence of cardiac tamponade. Large pleural  effusion in both left and right lateral regions.   5. The mitral valve is grossly normal. Trivial mitral valve  regurgitation.   6. The aortic valve is tricuspid. Aortic valve regurgitation is not  visualized.   7. The inferior vena cava is dilated in size with <50% respiratory  variability, suggesting right atrial pressure of 15 mmHg.   Recent Labs: 01/31/2021: B Natriuretic Peptide 341.5 02/02/2021: TSH 8.890 02/28/2021: Magnesium 2.1 05/14/2021: ALT 12; BUN 19; Creatinine 1.39; Hemoglobin 10.6; Platelet Count 199; Potassium 5.1; Sodium 134   Recent Lipid Panel    Component Value Date/Time   TRIG 250 (H) 03/26/2011 0422    Physical Exam:   VS:  BP 128/72   Pulse 76   Ht 6' 3"  (1.905 m)   Wt 162 lb 12.8 oz (73.8 kg)   SpO2 93%   BMI 20.35 kg/m    Wt Readings from Last 3 Encounters:  05/31/21 162 lb 12.8 oz (73.8 kg)  05/21/21 154 lb 6.4 oz (70 kg)  04/01/21 165 lb (74.8 kg)    General: Well nourished, well developed, in no acute distress Head: Atraumatic, normal size  Eyes: PEERLA, EOMI  Neck: Supple, no JVD Endocrine: No thryomegaly Cardiac: Normal S1, S2; RRR; no murmurs, rubs, or gallops Lungs: Clear to auscultation bilaterally, no wheezing, rhonchi or rales  Abd: Soft, nontender, no hepatomegaly  Ext: Trace edema Musculoskeletal: No deformities, BUE and BLE strength normal and equal Skin: Warm and dry, no rashes   Neuro: Alert and oriented to person, place, time, and situation, CNII-XII grossly  intact, no focal deficits  Psych: Normal mood and affect   ASSESSMENT:   Keith [name] is a 55 y.o. male who presents for the following: 1. Chronic systolic heart failure ([place])   2. Anasarca   3. Primary hypertension     PLAN:   1. Chronic systolic heart failure ([place]) 2. Anasarca 3. Primary hypertension -He was diagnosed with volume overload and systolic heart failure with an EF 40 to 45%.  He has stage IV metastatic renal cancer.  He is intolerant of blood thinners due to anemia and prior GI bleeds.  Left heart catheterization and ischemia work-up was not recommended given his inability to tolerate blood thinners.  Medical management was recommended. -He is limited regarding medications due to CKD.  He cannot be on ACE/ARB/MRA/Arni.  Would recommend he continue Coreg 12.5 mg twice daily for now. -He remains on hydralazine.  BP likely too low for Imdur. -Volume status acceptable.  We will back off on Lasix to 20 mg every other day.  He overall is doing well.  He will see me back in 6 months.  Disposition: Return in about 6 months (around 11/30/2021).  Medication Adjustments/Labs and Tests Ordered: Current medicines are reviewed at length with the patient today.  Concerns regarding medicines are outlined above.  No orders of the defined types were placed in this encounter.  Meds ordered this encounter  Medications   furosemide (LASIX) 20 MG tablet    Sig: Take 1 tablet (20 mg total) by mouth every other day.    Dispense:  60 tablet    Refill:  2     Patient Instructions  Medication Instructions:  Decrease Lasix to every other day   *If you need a refill on your cardiac medications  before your next appointment, please call your pharmacy*   Follow-Up: At [facility], you and your health needs are our priority.  As part of our continuing mission to provide you with exceptional heart care, we have created designated Provider Care Teams.  These Care Teams include your primary  Cardiologist (physician) and Advanced Practice Providers (APPs -  Physician Assistants and Nurse Practitioners) who all work together to provide you with the care you need, when you need it.  We recommend signing up for the patient portal called "MyChart".  Sign up information is provided on this After Visit Summary.  MyChart is used to connect with patients for Virtual Visits (Telemedicine).  Patients are able to view lab/test results, encounter notes, upcoming appointments, etc.  Non-urgent messages can be sent to your provider as well.   To learn more about what you can do with MyChart, go to [email or web address].    Your next appointment:   6 month(s)  The format for your next appointment:   In Person  Provider:   [name], MD     Time Spent with Patient: I have spent a total of 35 minutes with patient reviewing hospital notes, telemetry, EKGs, labs and examining the patient as well as establishing an assessment and plan that was discussed with the patient.  > 50% of time was spent in direct patient care.  Signed, Addison Naegeli. Audie Box, MD, [facility]  [address], [place], [place] [address] [phone]  05/31/2021 9:39 AM

## 2021-05-31 ENCOUNTER — Other Ambulatory Visit: Payer: Self-pay

## 2021-05-31 ENCOUNTER — Inpatient Hospital Stay: Payer: Medicare Other | Attending: Oncology

## 2021-05-31 ENCOUNTER — Ambulatory Visit: Payer: Medicare Other | Admitting: Cardiovascular Disease

## 2021-05-31 ENCOUNTER — Encounter: Payer: Self-pay | Admitting: Cardiovascular Disease

## 2021-05-31 ENCOUNTER — Other Ambulatory Visit: Payer: Self-pay | Admitting: *Deleted

## 2021-05-31 ENCOUNTER — Inpatient Hospital Stay: Payer: Medicare Other

## 2021-05-31 VITALS — BP 128/72 | HR 76 | Ht 75.0 in | Wt 162.8 lb

## 2021-05-31 DIAGNOSIS — C7889 Secondary malignant neoplasm of other digestive organs: Secondary | ICD-10-CM | POA: Insufficient documentation

## 2021-05-31 DIAGNOSIS — R601 Generalized edema: Secondary | ICD-10-CM

## 2021-05-31 DIAGNOSIS — C649 Malignant neoplasm of unspecified kidney, except renal pelvis: Secondary | ICD-10-CM

## 2021-05-31 DIAGNOSIS — Z5112 Encounter for antineoplastic immunotherapy: Secondary | ICD-10-CM | POA: Diagnosis not present

## 2021-05-31 DIAGNOSIS — I10 Essential (primary) hypertension: Secondary | ICD-10-CM | POA: Diagnosis not present

## 2021-05-31 DIAGNOSIS — I5022 Chronic systolic (congestive) heart failure: Secondary | ICD-10-CM

## 2021-05-31 DIAGNOSIS — C7800 Secondary malignant neoplasm of unspecified lung: Secondary | ICD-10-CM | POA: Insufficient documentation

## 2021-05-31 DIAGNOSIS — N1831 Chronic kidney disease, stage 3a: Secondary | ICD-10-CM | POA: Diagnosis not present

## 2021-05-31 DIAGNOSIS — C787 Secondary malignant neoplasm of liver and intrahepatic bile duct: Secondary | ICD-10-CM | POA: Diagnosis not present

## 2021-05-31 DIAGNOSIS — Z79899 Other long term (current) drug therapy: Secondary | ICD-10-CM | POA: Diagnosis not present

## 2021-05-31 DIAGNOSIS — C642 Malignant neoplasm of left kidney, except renal pelvis: Secondary | ICD-10-CM | POA: Diagnosis not present

## 2021-05-31 DIAGNOSIS — C7951 Secondary malignant neoplasm of bone: Secondary | ICD-10-CM | POA: Insufficient documentation

## 2021-05-31 LAB — CBC WITH DIFFERENTIAL (CANCER CENTER ONLY)
Abs Immature Granulocytes: 0.01 10*3/uL (ref 0.00–0.07)
Basophils Absolute: 0.1 10*3/uL (ref 0.0–0.1)
Basophils Relative: 1 %
Eosinophils Absolute: 0.2 10*3/uL (ref 0.0–0.5)
Eosinophils Relative: 3 %
HCT: 31.8 % — ABNORMAL LOW (ref 39.0–52.0)
Hemoglobin: 9.2 g/dL — ABNORMAL LOW (ref 13.0–17.0)
Immature Granulocytes: 0 %
Lymphocytes Relative: 15 %
Lymphs Abs: 1 10*3/uL (ref 0.7–4.0)
MCH: 25.6 pg — ABNORMAL LOW (ref 26.0–34.0)
MCHC: 28.9 g/dL — ABNORMAL LOW (ref 30.0–36.0)
MCV: 88.3 fL (ref 80.0–100.0)
Monocytes Absolute: 0.5 10*3/uL (ref 0.1–1.0)
Monocytes Relative: 7 %
Neutro Abs: 5.2 10*3/uL (ref 1.7–7.7)
Neutrophils Relative %: 74 %
Platelet Count: 207 10*3/uL (ref 150–400)
RBC: 3.6 MIL/uL — ABNORMAL LOW (ref 4.22–5.81)
RDW: 16.9 % — ABNORMAL HIGH (ref 11.5–15.5)
WBC Count: 7.1 10*3/uL (ref 4.0–10.5)
nRBC: 0 % (ref 0.0–0.2)

## 2021-05-31 LAB — CMP (CANCER CENTER ONLY)
ALT: 14 U/L (ref 0–44)
AST: 16 U/L (ref 15–41)
Albumin: 2.9 g/dL — ABNORMAL LOW (ref 3.5–5.0)
Alkaline Phosphatase: 104 U/L (ref 38–126)
Anion gap: 4 — ABNORMAL LOW (ref 5–15)
BUN: 13 mg/dL (ref 6–20)
CO2: 27 mmol/L (ref 22–32)
Calcium: 8.7 mg/dL — ABNORMAL LOW (ref 8.9–10.3)
Chloride: 102 mmol/L (ref 98–111)
Creatinine: 1.38 mg/dL — ABNORMAL HIGH (ref 0.61–1.24)
GFR, Estimated: >60 mL/min (ref >60)
Glucose, Bld: 105 mg/dL — ABNORMAL HIGH (ref 70–99)
Potassium: 4.7 mmol/L (ref 3.5–5.1)
Sodium: 133 mmol/L — ABNORMAL LOW (ref 135–145)
Total Bilirubin: 0.3 mg/dL (ref 0.3–1.2)
Total Protein: 8 g/dL (ref 6.5–8.1)

## 2021-05-31 MED ORDER — FUROSEMIDE 20 MG PO TABS: 20.0000 mg | ORAL_TABLET | ORAL | 2 refills | Status: AC

## 2021-05-31 MED ORDER — SODIUM CHLORIDE 0.9 % IV SOLN
200.0000 mg | Freq: Once | INTRAVENOUS | Status: AC
  Administered 2021-05-31: 200 mg via INTRAVENOUS
  Filled 2021-05-31: qty 8

## 2021-05-31 MED ORDER — SODIUM CHLORIDE 0.9 % IV SOLN
Freq: Once | INTRAVENOUS | Status: AC
  Filled 2021-05-31: qty 250

## 2021-05-31 NOTE — Patient Instructions (Signed)
[facility]  Discharge Instructions: Thank you for choosing [facility] to provide your oncology and hematology care.   If you have a lab appointment with the [facility], please go directly to the [facility] and check in at the registration area.   Wear comfortable clothing and clothing appropriate for easy access to any Portacath or PICC line.   We strive to give you quality time with your provider. You may need to reschedule your appointment if you arrive late (15 or more minutes).  Arriving late affects you and other patients whose appointments are after yours.  Also, if you miss three or more appointments without notifying the office, you may be dismissed from the clinic at the provider's discretion.      For prescription refill requests, have your pharmacy contact our office and allow 72 hours for refills to be completed.    Today you received the following chemotherapy and/or immunotherapy agents Keytruda      To help prevent nausea and vomiting after your treatment, we encourage you to take your nausea medication as directed.  BELOW ARE SYMPTOMS THAT SHOULD BE REPORTED IMMEDIATELY: *FEVER GREATER THAN 100.4 F (38 C) OR HIGHER *CHILLS OR SWEATING *NAUSEA AND VOMITING THAT IS NOT CONTROLLED WITH YOUR NAUSEA MEDICATION *UNUSUAL SHORTNESS OF BREATH *UNUSUAL BRUISING OR BLEEDING *URINARY PROBLEMS (pain or burning when urinating, or frequent urination) *BOWEL PROBLEMS (unusual diarrhea, constipation, pain near the anus) TENDERNESS IN MOUTH AND THROAT WITH OR WITHOUT PRESENCE OF ULCERS (sore throat, sores in mouth, or a toothache) UNUSUAL RASH, SWELLING OR PAIN  UNUSUAL VAGINAL DISCHARGE OR ITCHING   Items with * indicate a potential emergency and should be followed up as soon as possible or go to the Emergency Department if any problems should occur.  Please show the CHEMOTHERAPY ALERT CARD or IMMUNOTHERAPY ALERT CARD at check-in to  the Emergency Department and triage nurse.  Should you have questions after your visit or need to cancel or reschedule your appointment, please contact [facility]  Dept: [phone]  and follow the prompts.  Office hours are 8:00 a.m. to 4:30 p.m. Monday - Friday. Please note that voicemails left after 4:00 p.m. may not be returned until the following business day.  We are closed weekends and major holidays. You have access to a nurse at all times for urgent questions. Please call the main number to the clinic Dept: [phone] and follow the prompts.   For any non-urgent questions, you may also contact your provider using MyChart. We now offer e-Visits for anyone 65 and older to request care online for non-urgent symptoms. For details visit [email or web address].   Also download the MyChart app! Go to the app store, search "MyChart", open the app, select [facility], and log in with your MyChart username and password.  Due to Covid, a mask is required upon entering the hospital/clinic. If you do not have a mask, one will be given to you upon arrival. For doctor visits, patients may have 1 support person aged 61 or older with them. For treatment visits, patients cannot have anyone with them due to current Covid guidelines and our immunocompromised population.   Pembrolizumab injection What is this medication? PEMBROLIZUMAB (pem broe liz ue mab) is a monoclonal antibody. It is used totreat certain types of cancer. This medicine may be used for other purposes; ask your health care provider orpharmacist if you have questions. COMMON BRAND NAME(S): Keytruda What should I  tell my care team before I take this medication? They need to know if you have any of these conditions: autoimmune diseases like Crohn's disease, ulcerative colitis, or lupus have had or planning to have an allogeneic stem cell transplant (uses someone else's stem cells) history of organ  transplant history of chest radiation nervous system problems like myasthenia gravis or Guillain-Barre syndrome an unusual or allergic reaction to pembrolizumab, other medicines, foods, dyes, or preservatives pregnant or trying to get pregnant breast-feeding How should I use this medication? This medicine is for infusion into a vein. It is given by a health careprofessional in a hospital or clinic setting. A special MedGuide will be given to you before each treatment. Be sure to readthis information carefully each time. Talk to your pediatrician regarding the use of this medicine in children. While this drug may be prescribed for children as young as 6 months for selectedconditions, precautions do apply. Overdosage: If you think you have taken too much of this medicine contact apoison control center or emergency room at once. NOTE: This medicine is only for you. Do not share this medicine with others. What if I miss a dose? It is important not to miss your dose. Call your doctor or health careprofessional if you are unable to keep an appointment. What may interact with this medication? Interactions have not been studied. This list may not describe all possible interactions. Give your health care provider a list of all the medicines, herbs, non-prescription drugs, or dietary supplements you use. Also tell them if you smoke, drink alcohol, or use illegaldrugs. Some items may interact with your medicine. What should I watch for while using this medication? Your condition will be monitored carefully while you are receiving thismedicine. You may need blood work done while you are taking this medicine. Do not become pregnant while taking this medicine or for 4 months after stopping it. Women should inform their doctor if they wish to become pregnant or think they might be pregnant. There is a potential for serious side effects to an unborn child. Talk to your health care professional or pharmacist for  more information. Do not breast-feed an infant while taking this medicine orfor 4 months after the last dose. What side effects may I notice from receiving this medication? Side effects that you should report to your doctor or health care professionalas soon as possible: allergic reactions like skin rash, itching or hives, swelling of the face, lips, or tongue bloody or black, tarry breathing problems changes in vision chest pain chills confusion constipation cough diarrhea dizziness or feeling faint or lightheaded fast or irregular heartbeat fever flushing joint pain low blood counts - this medicine may decrease the number of white blood cells, red blood cells and platelets. You may be at increased risk for infections and bleeding. muscle pain muscle weakness pain, tingling, numbness in the hands or feet persistent headache redness, blistering, peeling or loosening of the skin, including inside the mouth signs and symptoms of high blood sugar such as dizziness; dry mouth; dry skin; fruity breath; nausea; stomach pain; increased hunger or thirst; increased urination signs and symptoms of kidney injury like trouble passing urine or change in the amount of urine signs and symptoms of liver injury like dark urine, light-colored stools, loss of appetite, nausea, right upper belly pain, yellowing of the eyes or skin sweating swollen lymph nodes weight loss Side effects that usually do not require medical attention (report to yourdoctor or health care professional if they continue  or are bothersome): decreased appetite hair loss tiredness This list may not describe all possible side effects. Call your doctor for medical advice about side effects. You may report side effects to FDA at1-800-FDA-1088. Where should I keep my medication? This drug is given in a hospital or clinic and will not be stored at home. NOTE: This sheet is a summary. It may not cover all possible information. If you  have questions about this medicine, talk to your doctor, pharmacist, orhealth care provider.  2022 Elsevier/Gold Standard (2019-11-09 21:44:53)

## 2021-05-31 NOTE — Patient Outreach (Signed)
[facility]) Care Management  05/31/2021  Keith [name] Jan 21, 1966 [id number]   [facility] Case closure - unable to maintain contact   Unsuccessful outreaches on 05/02/21, 05/10/21, 05/17/21  Sent unsuccessful outreach letter 05/10/21 without a response   Plan  [facility] RN CM will close case after no response from patient within 10 business days. Unable to maintain contact Case closure letters sent to patient and MD   Goals Addressed               This Visit's Progress     Patient Stated     COMPLETED: [facility]) Track and Manage Fluids and Swelling-Heart Failure (pt-stated)        Timeframe:  Short-Term Goal Priority:  High Start Date:                02/13/21             Expected End Date:  03/21/21                     Case closure 05/31/21   - use salt in moderation - watch for swelling in feet, ankles and legs every day      Notes:   05/31/21 case closure Unsuccessful outreaches on 05/02/21, 05/10/21, 05/17/21  Sent unsuccessful outreach letter 05/10/21 without a response  04/19/21  denies swelling, reports doing better brief outreach as line disconnected  04/08/21 unsuccessful outreach 04/03/21 unsuccessful outreach 04/01/21 admission for left shoulder surgery 03/20/21 ED left shoulder injury- tripped over his dog 03/06/21 changing diet- less sodium monitoring swelling, denies swelling 02/20/21 decrease swelling, weighing, decreasing sodium 02/13/21 noted with increased swelling          Karanvir Balderston L. Lavina Hamman, RN, BSN, [place] Coordinator Office number [phone] Mobile number [phone]  Main [facility] number [phone] Fax number [phone]

## 2021-05-31 NOTE — Patient Instructions (Signed)
Medication Instructions:  Decrease Lasix to every other day   *If you need a refill on your cardiac medications before your next appointment, please call your pharmacy*   Follow-Up: At [facility], you and your health needs are our priority.  As part of our continuing mission to provide you with exceptional heart care, we have created designated Provider Care Teams.  These Care Teams include your primary Cardiologist (physician) and Advanced Practice Providers (APPs -  Physician Assistants and Nurse Practitioners) who all work together to provide you with the care you need, when you need it.  We recommend signing up for the patient portal called "MyChart".  Sign up information is provided on this After Visit Summary.  MyChart is used to connect with patients for Virtual Visits (Telemedicine).  Patients are able to view lab/test results, encounter notes, upcoming appointments, etc.  Non-urgent messages can be sent to your provider as well.   To learn more about what you can do with MyChart, go to [email or web address].    Your next appointment:   6 month(s)  The format for your next appointment:   In Person  Provider:   [name], MD

## 2021-06-03 ENCOUNTER — Telehealth: Payer: Self-pay | Admitting: *Deleted

## 2021-06-03 NOTE — Telephone Encounter (Signed)
-----   Message from Rolene Course, RN sent at 05/31/2021  2:46 PM EDT ----- Regarding: [name] 1st Tx F/U call - [name] 1st Tx F/U call - [name]

## 2021-06-03 NOTE — Telephone Encounter (Signed)
Called pt to see how he did with his first [place].  He reports doing Ok except for some fatigue.  He reports that his kidney MD told him to see a nutritionist due to his K+ being elevated.  Referral made to [facility] RD.

## 2021-06-06 ENCOUNTER — Inpatient Hospital Stay: Payer: Medicare Other | Attending: Oncology

## 2021-06-06 NOTE — Progress Notes (Signed)
Nutrition Assessment   Reason for Assessment:  Referral from RN requesting diet education for high potassium   ASSESSMENT:  55 year old male with kidney cancer diagnosed in 2009.  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 Recent pathological fracture of left humerus in April 2022. Patient started lenvatinib and pembrolizumab.    Spoke with patient via phone.  Patient reports that he is concerned and does now want his potassium level to get too high.  "I want to eat the right things."  Patient has been eating less potatoes and trying to limit sodium in diet.  Reports that appetite is up and down.  Usually eats oatmeal for breakfast with toast. Snacks on peanut butter crackers.  He and wife usually fix evening meal of meat and couple of sides (pork chop, corn, stuffing last night).  Says that he does not add salt at the table or in cooking.    Medications: Fe sulfate, lasix, mag ox, protonix, zofran, Vit B 12   Labs: K 4.7 on 6/10, BUN 13, creatinine 1.38   Anthropometrics:   Height: 75 inches Weight: 162 lb 12.8 oz on 6/10 5/31 154 lb 4/13 163 lb 3/22 165 lb  BMI: 20   Estimated Energy Needs  Kcals: 1850-2200 Protein: 88-111 g Fluid: 1.8 L   NUTRITION DIAGNOSIS: Food and nutrition related knowledge deficit related to past elevated K level and concerns about sodium in foods as evidenced by requested meeting with RD   INTERVENTION:  Discussed foods high in potassium.  Will mail High Potassium Food list to patient from AND.  Patient aware that current K level is normal.  Discussed ways to be mindful of sodium in foods.  Low-Sodium Nutrition Therapy handout from AND and Sodium Free Flavoring Tips from AND mailed to patient.  Contact information provided to patient.    MONITORING, EVALUATION, GOAL: weight trends, intake   Next Visit: Thursday, June 30 during infusion with Edd Fabian B. Zenia Resides, [place],  [place] Registered Dietitian [phone] (mobile)

## 2021-06-07 ENCOUNTER — Other Ambulatory Visit: Payer: Self-pay | Admitting: Cardiovascular Disease

## 2021-06-13 ENCOUNTER — Other Ambulatory Visit (HOSPITAL_COMMUNITY): Payer: Self-pay

## 2021-06-20 ENCOUNTER — Other Ambulatory Visit (HOSPITAL_COMMUNITY): Payer: Self-pay

## 2021-06-20 ENCOUNTER — Inpatient Hospital Stay: Payer: Medicare Other

## 2021-06-20 ENCOUNTER — Inpatient Hospital Stay: Payer: Medicare Other | Admitting: Nutrition

## 2021-06-20 ENCOUNTER — Inpatient Hospital Stay: Payer: Medicare Other | Admitting: Oncology

## 2021-06-20 ENCOUNTER — Other Ambulatory Visit: Payer: Self-pay | Admitting: Oncology

## 2021-06-20 ENCOUNTER — Other Ambulatory Visit: Payer: Self-pay

## 2021-06-20 VITALS — BP 155/91 | HR 73 | Temp 98.8°F | Resp 18 | Ht 75.0 in | Wt 159.8 lb

## 2021-06-20 DIAGNOSIS — C7800 Secondary malignant neoplasm of unspecified lung: Secondary | ICD-10-CM | POA: Diagnosis not present

## 2021-06-20 DIAGNOSIS — Z5112 Encounter for antineoplastic immunotherapy: Secondary | ICD-10-CM | POA: Diagnosis not present

## 2021-06-20 DIAGNOSIS — C649 Malignant neoplasm of unspecified kidney, except renal pelvis: Secondary | ICD-10-CM

## 2021-06-20 DIAGNOSIS — C7889 Secondary malignant neoplasm of other digestive organs: Secondary | ICD-10-CM | POA: Diagnosis not present

## 2021-06-20 DIAGNOSIS — Z79899 Other long term (current) drug therapy: Secondary | ICD-10-CM | POA: Diagnosis not present

## 2021-06-20 DIAGNOSIS — C7951 Secondary malignant neoplasm of bone: Secondary | ICD-10-CM | POA: Diagnosis not present

## 2021-06-20 DIAGNOSIS — C642 Malignant neoplasm of left kidney, except renal pelvis: Secondary | ICD-10-CM | POA: Diagnosis not present

## 2021-06-20 DIAGNOSIS — C787 Secondary malignant neoplasm of liver and intrahepatic bile duct: Secondary | ICD-10-CM | POA: Diagnosis not present

## 2021-06-20 LAB — CMP (CANCER CENTER ONLY)
ALT: 15 U/L (ref 0–44)
AST: 19 U/L (ref 15–41)
Albumin: 3.1 g/dL — ABNORMAL LOW (ref 3.5–5.0)
Alkaline Phosphatase: 116 U/L (ref 38–126)
Anion gap: 5 (ref 5–15)
BUN: 19 mg/dL (ref 6–20)
CO2: 26 mmol/L (ref 22–32)
Calcium: 8.8 mg/dL — ABNORMAL LOW (ref 8.9–10.3)
Chloride: 102 mmol/L (ref 98–111)
Creatinine: 1.33 mg/dL — ABNORMAL HIGH (ref 0.61–1.24)
GFR, Estimated: >60 mL/min (ref >60)
Glucose, Bld: 106 mg/dL — ABNORMAL HIGH (ref 70–99)
Potassium: 5.7 mmol/L — ABNORMAL HIGH (ref 3.5–5.1)
Sodium: 133 mmol/L — ABNORMAL LOW (ref 135–145)
Total Bilirubin: 0.3 mg/dL (ref 0.3–1.2)
Total Protein: 8.2 g/dL — ABNORMAL HIGH (ref 6.5–8.1)

## 2021-06-20 LAB — CBC WITH DIFFERENTIAL (CANCER CENTER ONLY)
Abs Immature Granulocytes: 0.01 10*3/uL (ref 0.00–0.07)
Basophils Absolute: 0.1 10*3/uL (ref 0.0–0.1)
Basophils Relative: 1 %
Eosinophils Absolute: 0.3 10*3/uL (ref 0.0–0.5)
Eosinophils Relative: 4 %
HCT: 37.4 % — ABNORMAL LOW (ref 39.0–52.0)
Hemoglobin: 11 g/dL — ABNORMAL LOW (ref 13.0–17.0)
Immature Granulocytes: 0 %
Lymphocytes Relative: 16 %
Lymphs Abs: 1.4 10*3/uL (ref 0.7–4.0)
MCH: 26.3 pg (ref 26.0–34.0)
MCHC: 29.4 g/dL — ABNORMAL LOW (ref 30.0–36.0)
MCV: 89.5 fL (ref 80.0–100.0)
Monocytes Absolute: 0.5 10*3/uL (ref 0.1–1.0)
Monocytes Relative: 6 %
Neutro Abs: 6.1 10*3/uL (ref 1.7–7.7)
Neutrophils Relative %: 73 %
Platelet Count: 216 10*3/uL (ref 150–400)
RBC: 4.18 MIL/uL — ABNORMAL LOW (ref 4.22–5.81)
RDW: 19.5 % — ABNORMAL HIGH (ref 11.5–15.5)
WBC Count: 8.5 10*3/uL (ref 4.0–10.5)
nRBC: 0 % (ref 0.0–0.2)

## 2021-06-20 LAB — TSH: TSH: 6.466 u[IU]/mL — ABNORMAL HIGH (ref 0.320–4.118)

## 2021-06-20 MED ORDER — SODIUM CHLORIDE 0.9 % IV SOLN
200.0000 mg | Freq: Once | INTRAVENOUS | Status: DC
  Filled 2021-06-20 (×2): qty 8

## 2021-06-20 MED ORDER — SODIUM CHLORIDE 0.9 % IV SOLN
Freq: Once | INTRAVENOUS | Status: AC
  Filled 2021-06-20: qty 250

## 2021-06-20 MED ORDER — SODIUM CHLORIDE 0.9 % IV SOLN
200.0000 mg | Freq: Once | INTRAVENOUS | Status: AC
  Administered 2021-06-20: 200 mg via INTRAVENOUS
  Filled 2021-06-20: qty 8

## 2021-06-20 NOTE — Progress Notes (Signed)
Nutrition follow-up completed with patient during infusion for stage IV clear-cell renal cancer. Patient reports he is doing okay. Weight documented as 159.8 pounds today.  This does reflect lower extremity edema. Labs noted: Sodium 133, potassium 5.7, glucose 106, BUN 19, creatinine 1.33, and albumin 3.1. Patient was educated on low-sodium and potassium previously.  Patient does have some questions regarding appropriate foods for him to eat.  States he prefers to hear what he can eat rather than what he should not eat.  Reports eating oatmeal with toast for breakfast or egg with bologna.  At lunch he typically eats a half a sandwich and snacks on peanut butter crackers.  For dinner last night he ate a pork chop with "double boiled" corn and macaroni and cheese.  He misses potatoes and tomatoes.  Nutrition diagnosis: Food and nutrition related knowledge deficit continues.  Intervention: Reviewed low sodium, low potassium diet and worked with patient describing foods he could enjoy while staying within his diet limitations.  Stressed importance of serving sizes especially when consuming foods that could contain higher levels of sodium or potassium if eaten in excess.  Patient educated on leaching potatoes so that he could enjoy these occasionally.  Encouraged fresh or frozen or "no salt canned" vegetables.  Patient verbalized understanding and appreciation.  He has my contact information for further questions.  Monitoring, evaluation, goals: Patient will tolerate adequate calories and protein to maintain lean body mass.  Next visit: To be scheduled as needed.  **Disclaimer: This note was dictated with voice recognition software. Similar sounding words can inadvertently be transcribed and this note may contain transcription errors which may not have been corrected upon publication of note.**

## 2021-06-20 NOTE — Patient Instructions (Signed)
[facility]  Discharge Instructions: Thank you for choosing [facility] to provide your oncology and hematology care.   If you have a lab appointment with the [facility], please go directly to the [facility] and check in at the registration area.   Wear comfortable clothing and clothing appropriate for easy access to any Portacath or PICC line.   We strive to give you quality time with your provider. You may need to reschedule your appointment if you arrive late (15 or more minutes).  Arriving late affects you and other patients whose appointments are after yours.  Also, if you miss three or more appointments without notifying the office, you may be dismissed from the clinic at the provider's discretion.      For prescription refill requests, have your pharmacy contact our office and allow 72 hours for refills to be completed.    Today you received the following chemotherapy and/or immunotherapy agents Keytruda      To help prevent nausea and vomiting after your treatment, we encourage you to take your nausea medication as directed.  BELOW ARE SYMPTOMS THAT SHOULD BE REPORTED IMMEDIATELY: *FEVER GREATER THAN 100.4 F (38 C) OR HIGHER *CHILLS OR SWEATING *NAUSEA AND VOMITING THAT IS NOT CONTROLLED WITH YOUR NAUSEA MEDICATION *UNUSUAL SHORTNESS OF BREATH *UNUSUAL BRUISING OR BLEEDING *URINARY PROBLEMS (pain or burning when urinating, or frequent urination) *BOWEL PROBLEMS (unusual diarrhea, constipation, pain near the anus) TENDERNESS IN MOUTH AND THROAT WITH OR WITHOUT PRESENCE OF ULCERS (sore throat, sores in mouth, or a toothache) UNUSUAL RASH, SWELLING OR PAIN  UNUSUAL VAGINAL DISCHARGE OR ITCHING   Items with * indicate a potential emergency and should be followed up as soon as possible or go to the Emergency Department if any problems should occur.  Please show the CHEMOTHERAPY ALERT CARD or IMMUNOTHERAPY ALERT CARD at check-in to  the Emergency Department and triage nurse.  Should you have questions after your visit or need to cancel or reschedule your appointment, please contact [facility]  Dept: [phone]  and follow the prompts.  Office hours are 8:00 a.m. to 4:30 p.m. Monday - Friday. Please note that voicemails left after 4:00 p.m. may not be returned until the following business day.  We are closed weekends and major holidays. You have access to a nurse at all times for urgent questions. Please call the main number to the clinic Dept: [phone] and follow the prompts.   For any non-urgent questions, you may also contact your provider using MyChart. We now offer e-Visits for anyone 71 and older to request care online for non-urgent symptoms. For details visit [email or web address].   Also download the MyChart app! Go to the app store, search "MyChart", open the app, select [facility], and log in with your MyChart username and password.  Due to Covid, a mask is required upon entering the hospital/clinic. If you do not have a mask, one will be given to you upon arrival. For doctor visits, patients may have 1 support person aged 35 or older with them. For treatment visits, patients cannot have anyone with them due to current Covid guidelines and our immunocompromised population.   Pembrolizumab injection What is this medication? PEMBROLIZUMAB (pem broe liz ue mab) is a monoclonal antibody. It is used totreat certain types of cancer. This medicine may be used for other purposes; ask your health care provider orpharmacist if you have questions. COMMON BRAND NAME(S): Keytruda What should I  tell my care team before I take this medication? They need to know if you have any of these conditions: autoimmune diseases like Crohn's disease, ulcerative colitis, or lupus have had or planning to have an allogeneic stem cell transplant (uses someone else's stem cells) history of organ  transplant history of chest radiation nervous system problems like myasthenia gravis or Guillain-Barre syndrome an unusual or allergic reaction to pembrolizumab, other medicines, foods, dyes, or preservatives pregnant or trying to get pregnant breast-feeding How should I use this medication? This medicine is for infusion into a vein. It is given by a health careprofessional in a hospital or clinic setting. A special MedGuide will be given to you before each treatment. Be sure to readthis information carefully each time. Talk to your pediatrician regarding the use of this medicine in children. While this drug may be prescribed for children as young as 6 months for selectedconditions, precautions do apply. Overdosage: If you think you have taken too much of this medicine contact apoison control center or emergency room at once. NOTE: This medicine is only for you. Do not share this medicine with others. What if I miss a dose? It is important not to miss your dose. Call your doctor or health careprofessional if you are unable to keep an appointment. What may interact with this medication? Interactions have not been studied. This list may not describe all possible interactions. Give your health care provider a list of all the medicines, herbs, non-prescription drugs, or dietary supplements you use. Also tell them if you smoke, drink alcohol, or use illegaldrugs. Some items may interact with your medicine. What should I watch for while using this medication? Your condition will be monitored carefully while you are receiving thismedicine. You may need blood work done while you are taking this medicine. Do not become pregnant while taking this medicine or for 4 months after stopping it. Women should inform their doctor if they wish to become pregnant or think they might be pregnant. There is a potential for serious side effects to an unborn child. Talk to your health care professional or pharmacist for  more information. Do not breast-feed an infant while taking this medicine orfor 4 months after the last dose. What side effects may I notice from receiving this medication? Side effects that you should report to your doctor or health care professionalas soon as possible: allergic reactions like skin rash, itching or hives, swelling of the face, lips, or tongue bloody or black, tarry breathing problems changes in vision chest pain chills confusion constipation cough diarrhea dizziness or feeling faint or lightheaded fast or irregular heartbeat fever flushing joint pain low blood counts - this medicine may decrease the number of white blood cells, red blood cells and platelets. You may be at increased risk for infections and bleeding. muscle pain muscle weakness pain, tingling, numbness in the hands or feet persistent headache redness, blistering, peeling or loosening of the skin, including inside the mouth signs and symptoms of high blood sugar such as dizziness; dry mouth; dry skin; fruity breath; nausea; stomach pain; increased hunger or thirst; increased urination signs and symptoms of kidney injury like trouble passing urine or change in the amount of urine signs and symptoms of liver injury like dark urine, light-colored stools, loss of appetite, nausea, right upper belly pain, yellowing of the eyes or skin sweating swollen lymph nodes weight loss Side effects that usually do not require medical attention (report to yourdoctor or health care professional if they continue  or are bothersome): decreased appetite hair loss tiredness This list may not describe all possible side effects. Call your doctor for medical advice about side effects. You may report side effects to FDA at1-800-FDA-1088. Where should I keep my medication? This drug is given in a hospital or clinic and will not be stored at home. NOTE: This sheet is a summary. It may not cover all possible information. If you  have questions about this medicine, talk to your doctor, pharmacist, orhealth care provider.  2022 Elsevier/Gold Standard (2019-11-09 21:44:53)

## 2021-06-20 NOTE — Progress Notes (Signed)
Hematology and Oncology Follow Up   Keith [name] [id number] 1966-09-15 55 y.o. 06/20/2021 11:41 AM [name], MDPharr, [name], MD       Principle Diagnosis: 55 year old man with kidney cancer diagnosed in 2009.  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  He has also pancreatic involvement.  Prior Therapy: Status post laparoscopic radical nephrectomy.  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  Patient status post thoracotomy for a synchronous metastatic lung lesions done October 2009.   Patient is status post stereotactic radiotherapy to pulmonary nodules in May of 2010. He is S/P Sutent 50 mg 4 weeks on 2 weeks off from 10/2010 to 03/2013. He progressed at that time.  He is S/P radiation to the right sacral bone between 4/22 to 4/30.  He is S/P XRT to the left shoulder 03/20/14 to 03/31/14. Votrient 800 mg by mouth daily from 03/2013 through 06/22/2015. Discontinued secondary to disease progression. Nivolumab 3 mg/kg given every 2 weeks started on 06/29/2015. He is status post 4 cycles completed 08/10/2015. He developed disease progression in September 2016.  Status post radiation therapy to the left mid fibula completed on 11/14/2015. He received a grade 1 fraction. He is status post radiation to the proximal and distal femur over 2 weeks and 10 fractions of total of 30 Gy.  This was completed in March 2019. 11.   Radiation therapy to the right tibia.  Last treatment completed in September 2019. 12. Cabometyx 60 mg daily started in November 2016.  Therapy discontinued and February 2022 due to progression of disease. 13.  Axitinib 5 mg twice a day started in March 2022.  Therapy discontinued in June 2022 due to progression of disease.   Current therapy: Pembrolizumab 200 mg every 3 weeks with lenvatinib 20 mg daily started on May 26, 2021.  He is here for cycle 2 of therapy.  Interim History: Keith [name] presents today for a follow-up visit.  Since last visit, he completed the first cycle of Pembrolizumab with lenvatinib without any major complications.  He denies any nausea, vomiting or abdominal pain.  He denies any worsening bone pain or pathological fractures.  He denies any respiratory complaints including shortness of breath or difficulty breathing.  His hematochezia has resolved.    Medications: Updated history review. Current Outpatient Medications  Medication Sig Dispense Refill   acetaminophen (TYLENOL) 500 MG tablet Take 500 mg by mouth every 6 (six) hours as needed for moderate pain or mild pain.     ALPRAZolam (XANAX) 1 MG tablet Take 1 tablet by mouth every 8 hours as needed. 90 tablet 0   carvedilol (COREG) 12.5 MG tablet TAKE 1 TABLET (12.5 MG TOTAL) BY MOUTH 2 (TWO) TIMES DAILY WITH A MEAL. 180 tablet 1   Cyanocobalamin (B-12) 1000 MCG TABS Take 1 tablet (1,000 mcg total) by mouth daily. 30 tablet    diphenhydrAMINE (BENADRYL) 25 MG tablet Take 25 mg by mouth daily.     diphenhydrAMINE (SOMINEX) 25 MG tablet Take 25 mg by mouth.     DULoxetine (CYMBALTA) 60 MG capsule Take 60 mg by mouth.     ferrous sulfate 325 (65 FE) MG tablet TAKE 1 TABLET BY MOUTH EVERY DAY WITH BREAKFAST (Patient taking differently: Take 325 mg by mouth daily.) 90 tablet 2   furosemide (LASIX) 20 MG tablet Take 1 tablet (20 mg total) by mouth every other day. 60 tablet 2   gabapentin (  NEURONTIN) 300 MG capsule TAKE 1 CAPSULE BY MOUTH THREE TIMES A DAY 270 capsule 3   Glycerin-Hypromellose-PEG 400 ([employer] DRY EYE RELIEF) 0.2-0.2-1 % SOLN Place 1 drop into both eyes daily as needed (Dry eye).     hydrALAZINE (APRESOLINE) 25 MG tablet Take 1 tablet (25 mg total) by mouth 3 (three) times daily. 270 tablet 3   HYDROmorphone (DILAUDID) 8 MG tablet Take 8 mg by mouth 3 (three) times daily.     lenvatinib 20 mg daily dose (LENVIMA) 2 x 10 MG capsule Take 2 capsules (20 mg total) by mouth daily. 60 capsule 1    Magnesium Oxide 400 MG CAPS Take 1 capsule (400 mg total) by mouth daily. 7 capsule 0   ondansetron (ZOFRAN) 4 MG tablet Take one tablet every morning.  Can take one tablet later in the day on an as needed basis (Patient taking differently: Take one tablet every morning.  Can take one tablet later in the day on an as needed basis) 50 tablet 11   PAIN MANAGEMENT INTRATHECAL, IT, PUMP 1 each by Intrathecal route continuous. Intrathecal (IT) medication:   Morphine.  Can push every 4 hours     pantoprazole (PROTONIX) 40 MG tablet Take 1 tablet (40 mg total) by mouth daily. 30 tablet 3   Sodium Polystyrene Sulfonate (KAYEXALATE PO) Take 15 g by mouth.     SPS 15 GM/60ML suspension Take 15 g by mouth.     tiZANidine (ZANAFLEX) 4 MG tablet Take 4 mg by mouth 3 (three) times daily.     VELTASSA 8.4 g packet Take 1 packet by mouth daily.     No current facility-administered medications for this visit.     Allergies:  Allergies  Allergen Reactions   Ceftriaxone Hives    Other reaction(s): red streaks Other reaction(s): red streaks   Hydrocodone Swelling    Physical examination:   Blood pressure (!) 155/91, pulse 73, temperature 98.8 F (37.1 C), temperature source Oral, resp. rate 18, height 6\' 3"  (1.905 m), weight 159 lb 12.8 oz (72.5 kg), SpO2 100 %.     ECOG 2   General appearance: Alert, awake without any distress. Head: Atraumatic without abnormalities Oropharynx: Without any thrush or ulcers. Eyes: No scleral icterus. Lymph nodes: No lymphadenopathy noted in the cervical, supraclavicular, or axillary nodes Heart:regular rate and rhythm, without any murmurs or gallops.   Lung: Clear to auscultation without any rhonchi, wheezes or dullness to percussion. Abdomin: Soft, nontender without any shifting dullness or ascites. Musculoskeletal: No clubbing or cyanosis. Neurological: No motor or sensory deficits. Skin: No rashes or lesions.                  Lab  Results: Lab Results  Component Value Date   WBC 8.5 06/20/2021   HGB 11.0 (L) 06/20/2021   HCT 37.4 (L) 06/20/2021   MCV 89.5 06/20/2021   PLT 216 06/20/2021     Chemistry      Component Value Date/Time   NA 133 (L) 06/20/2021 1053   NA 138 02/27/2021 1102   NA 142 12/24/2017 1317   K 5.7 (H) 06/20/2021 1053   K 4.3 12/24/2017 1317   CL 102 06/20/2021 1053   CL 106 05/24/2013 1436   CO2 26 06/20/2021 1053   CO2 32 (H) 12/24/2017 1317   BUN 19 06/20/2021 1053   BUN 28 (H) 02/27/2021 1102   BUN 10.3 12/24/2017 1317   CREATININE 1.33 (H) 06/20/2021 1053   CREATININE 0.9  12/24/2017 1317   GLU 160 (H) 06/29/2015 0957      Component Value Date/Time   CALCIUM 8.8 (L) 06/20/2021 1053   CALCIUM 8.8 12/24/2017 1317   ALKPHOS 116 06/20/2021 1053   ALKPHOS 95 12/24/2017 1317   AST 19 06/20/2021 1053   AST 20 12/24/2017 1317   ALT 15 06/20/2021 1053   ALT 25 12/24/2017 1317   BILITOT 0.3 06/20/2021 1053   BILITOT 0.25 12/24/2017 1317       Impression and Plan:   55 year old man with   1.  Stage IV clear-cell renal cell carcinoma with pulmonary, bone and pancreatic involvement noted since 2009.     He has completed the first cycle of Pembrolizumab and lenvatinib without any major complications.  Risks and benefits of continuing with cycle 2 of therapy were discussed.  Potential complications including immune issues, GI toxicities among others were reiterated.  The plan is to update his staging scans after cycle 4 of therapy.   2.  Pulmonary embolism: He is off Xarelto due to GI bleeding.   3.  Anemia: Improved off Xarelto.  Hemoglobin is up to 11.  4.    Lower extremity edema: He continues to be on diuretics with improvement in his edema.   5.  Prognosis: Therapy remains palliative although aggressive measures are warranted given his reasonable performance status.   6.  Hypercalcemia: Resolved at this time.  He received Zometa in May 2022 and will be repeated in 3  months.  7.  Pathological fracture of the left humerus: Improved after fixation without any residual pain.  8.  Follow-up: In 3 weeks for the next cycle of therapy.   30  minutes were dedicated to this visit.  Time was spent on reviewing laboratory data, disease status update, addressing complications related to cancer and cancer therapy.  Zola Button, MD 06/20/2021 11:41 AM

## 2021-06-21 ENCOUNTER — Encounter: Payer: Self-pay | Admitting: Oncology

## 2021-07-01 IMAGING — CR DG CHEST 2V
2 series · 2 of 2 positions shown · non-contrast
Comparison: 11/10/2015

CLINICAL DATA: Chest pain

EXAM:
CHEST - 2 VIEW

[w chest lat]
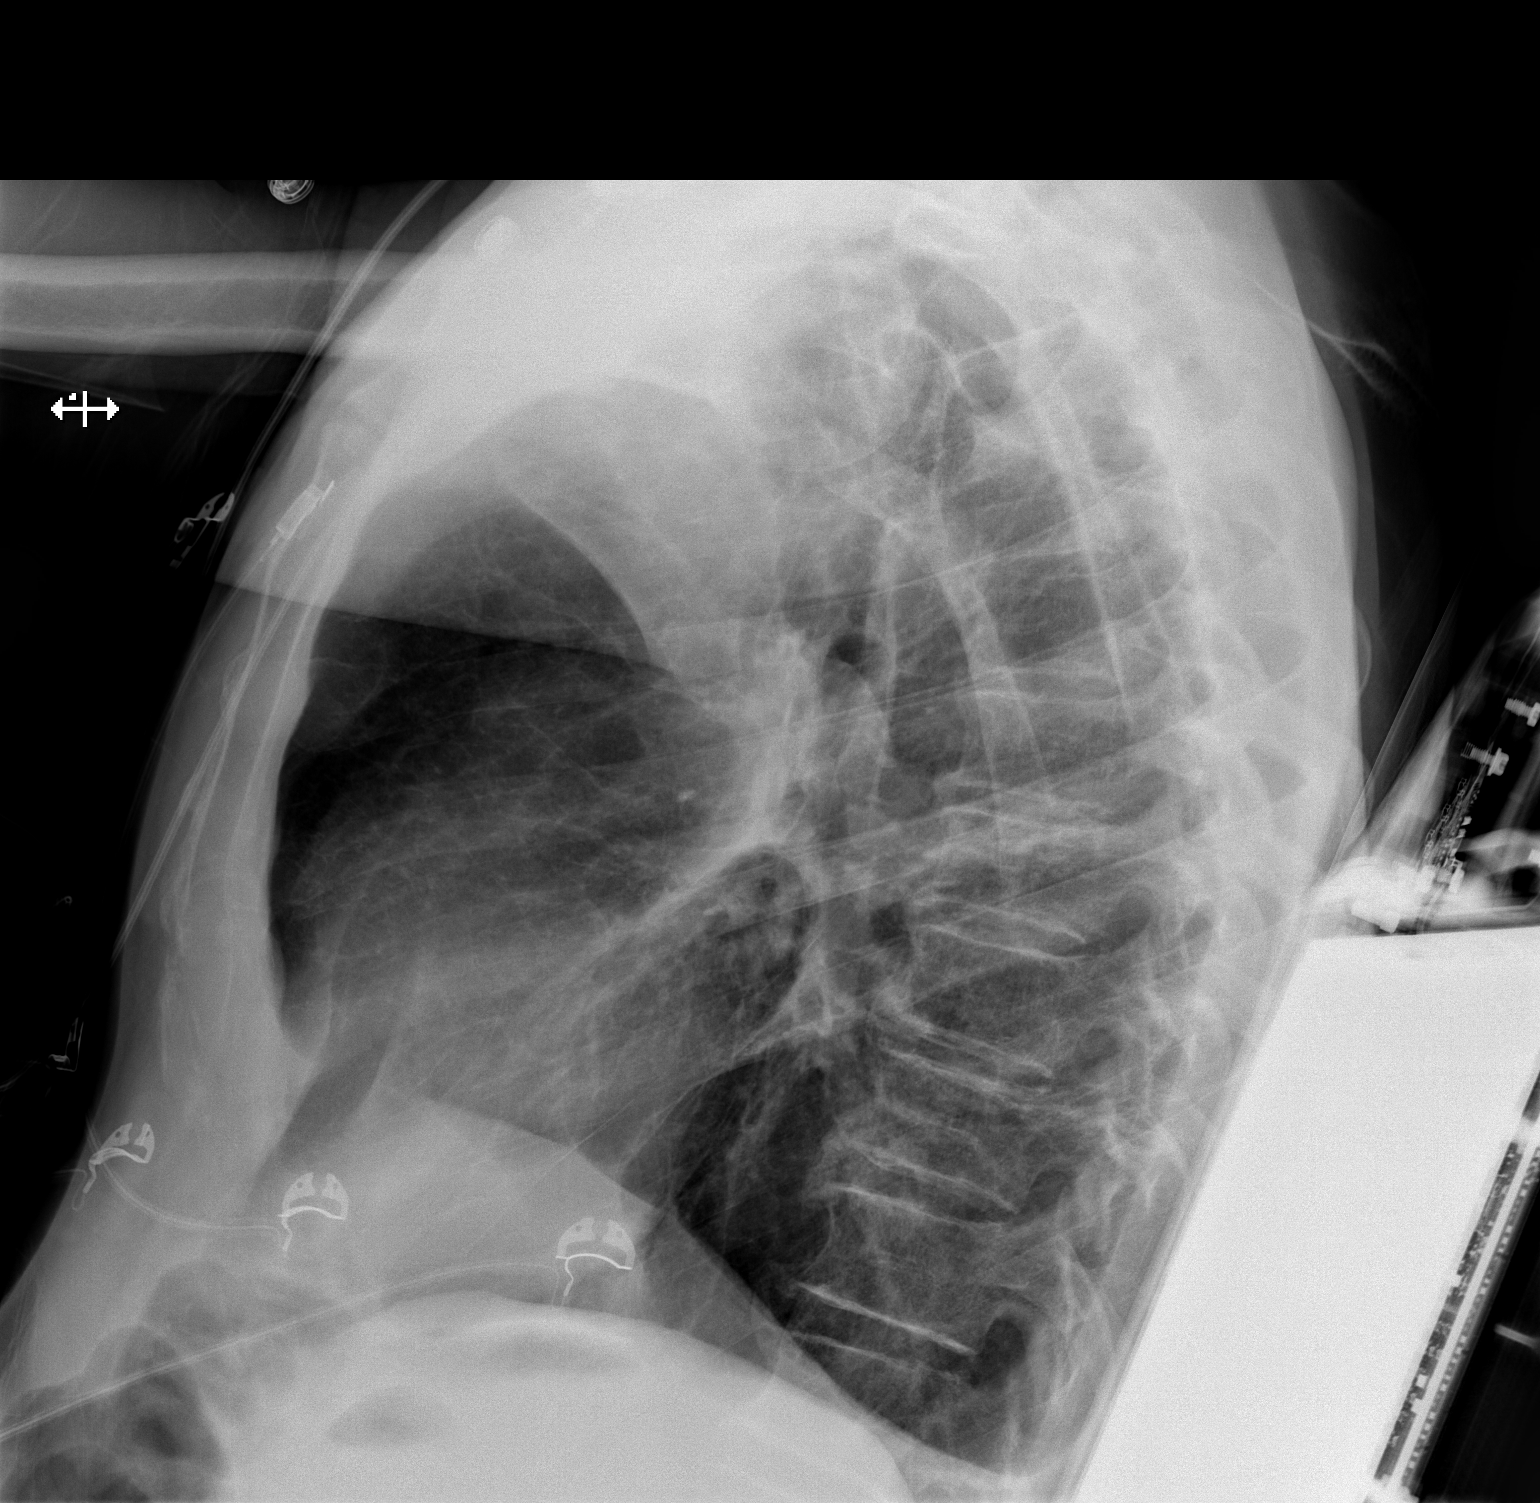

[x chest ap]
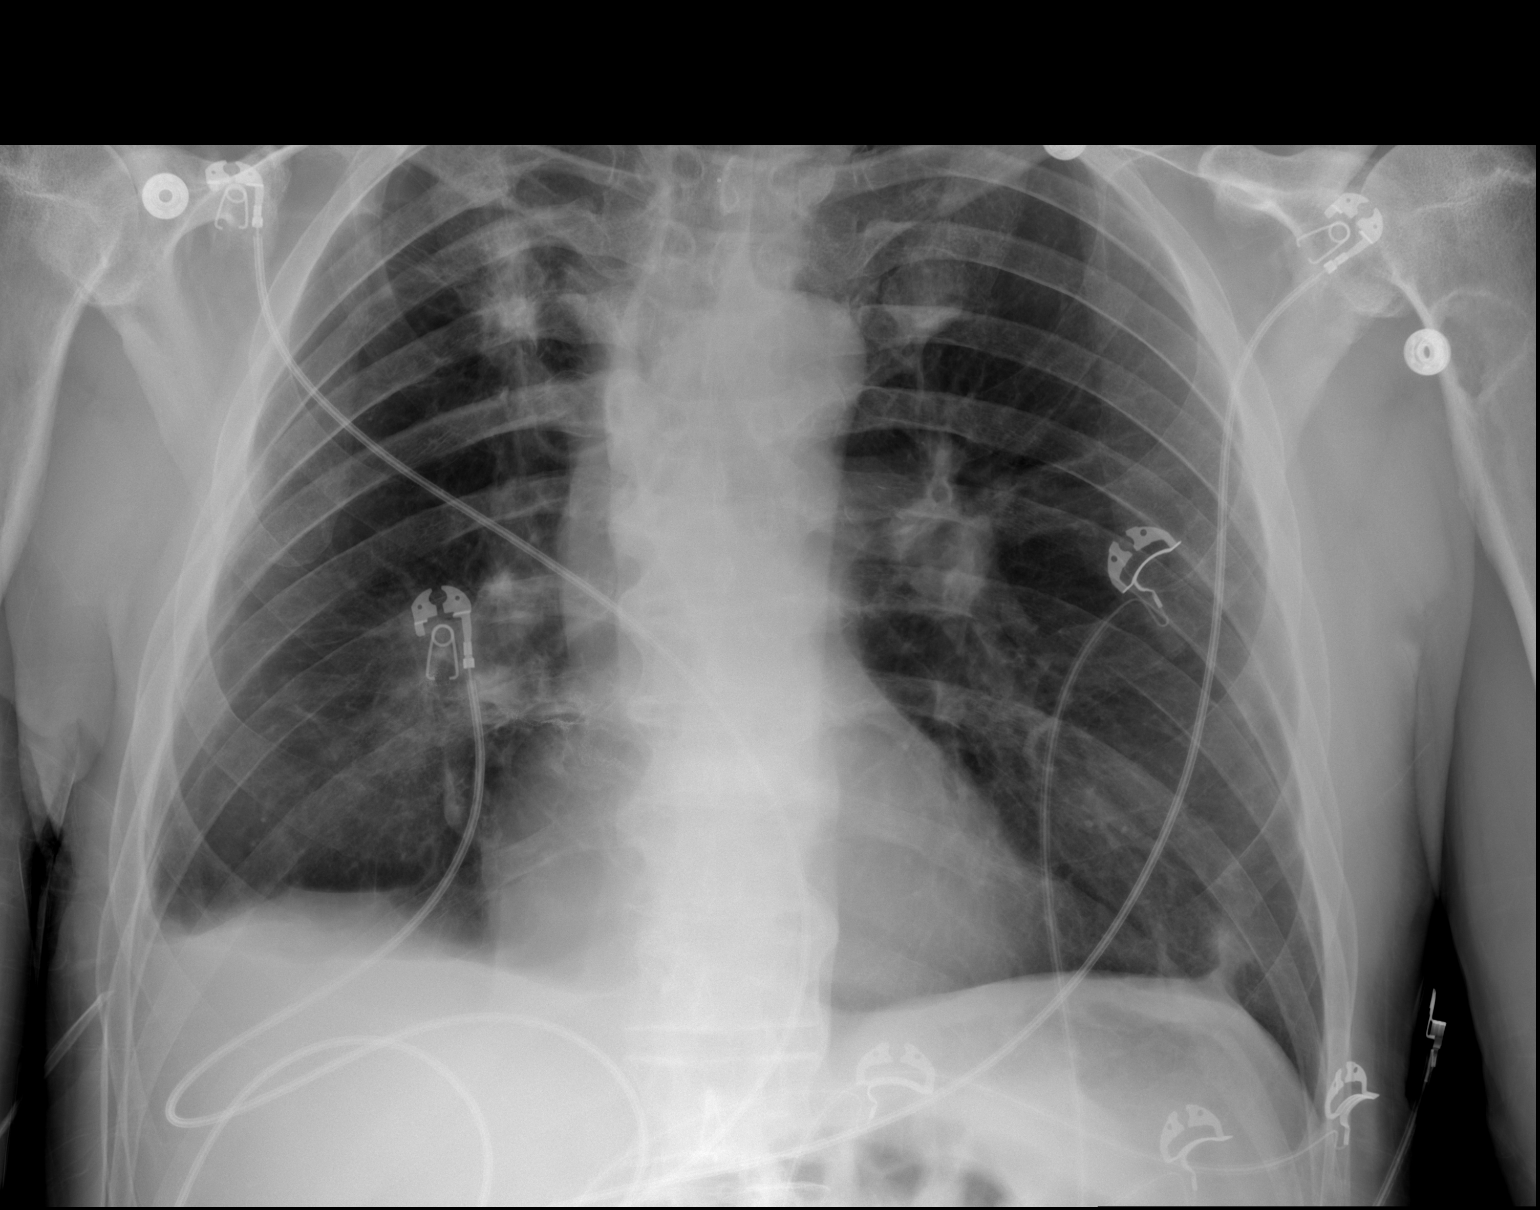

[2 of 2 positions shown; findings below may reference images not displayed]

FINDINGS: Heart is normal size. Small right pleural effusions suspected. There
is hyperinflation of the lungs compatible with COPD. Nodular
airspace opacity in the right upper lobe is similar to prior study
and CT. No acute confluent airspace opacity. No acute bony
abnormality.
IMPRESSION: COPD/chronic changes.

Small right pleural effusion.

## 2021-07-09 DIAGNOSIS — G894 Chronic pain syndrome: Secondary | ICD-10-CM | POA: Diagnosis not present

## 2021-07-11 DIAGNOSIS — G620 Drug-induced polyneuropathy: Secondary | ICD-10-CM | POA: Diagnosis not present

## 2021-07-11 DIAGNOSIS — Z9689 Presence of other specified functional implants: Secondary | ICD-10-CM | POA: Diagnosis not present

## 2021-07-11 DIAGNOSIS — G894 Chronic pain syndrome: Secondary | ICD-10-CM | POA: Diagnosis not present

## 2021-07-12 ENCOUNTER — Inpatient Hospital Stay: Payer: Medicare Other | Attending: Oncology

## 2021-07-12 ENCOUNTER — Other Ambulatory Visit: Payer: Self-pay

## 2021-07-12 ENCOUNTER — Other Ambulatory Visit: Payer: Self-pay | Admitting: Oncology

## 2021-07-12 ENCOUNTER — Inpatient Hospital Stay: Payer: Medicare Other

## 2021-07-12 ENCOUNTER — Other Ambulatory Visit (HOSPITAL_COMMUNITY): Payer: Self-pay

## 2021-07-12 ENCOUNTER — Inpatient Hospital Stay (HOSPITAL_BASED_OUTPATIENT_CLINIC_OR_DEPARTMENT_OTHER): Payer: Medicare Other | Admitting: Oncology

## 2021-07-12 VITALS — BP 166/93 | HR 73 | Temp 98.4°F | Resp 13 | Ht 75.0 in | Wt 162.7 lb

## 2021-07-12 DIAGNOSIS — C649 Malignant neoplasm of unspecified kidney, except renal pelvis: Secondary | ICD-10-CM | POA: Diagnosis not present

## 2021-07-12 DIAGNOSIS — C642 Malignant neoplasm of left kidney, except renal pelvis: Secondary | ICD-10-CM | POA: Diagnosis not present

## 2021-07-12 DIAGNOSIS — Z5112 Encounter for antineoplastic immunotherapy: Secondary | ICD-10-CM | POA: Diagnosis not present

## 2021-07-12 DIAGNOSIS — C7951 Secondary malignant neoplasm of bone: Secondary | ICD-10-CM | POA: Insufficient documentation

## 2021-07-12 DIAGNOSIS — Z79899 Other long term (current) drug therapy: Secondary | ICD-10-CM | POA: Diagnosis not present

## 2021-07-12 LAB — CBC WITH DIFFERENTIAL (CANCER CENTER ONLY)
Abs Immature Granulocytes: 0.01 10*3/uL (ref 0.00–0.07)
Basophils Absolute: 0.1 10*3/uL (ref 0.0–0.1)
Basophils Relative: 1 %
Eosinophils Absolute: 0.3 10*3/uL (ref 0.0–0.5)
Eosinophils Relative: 4 %
HCT: 39.1 % (ref 39.0–52.0)
Hemoglobin: 11.6 g/dL — ABNORMAL LOW (ref 13.0–17.0)
Immature Granulocytes: 0 %
Lymphocytes Relative: 16 %
Lymphs Abs: 1.1 10*3/uL (ref 0.7–4.0)
MCH: 26.6 pg (ref 26.0–34.0)
MCHC: 29.7 g/dL — ABNORMAL LOW (ref 30.0–36.0)
MCV: 89.7 fL (ref 80.0–100.0)
Monocytes Absolute: 0.6 10*3/uL (ref 0.1–1.0)
Monocytes Relative: 8 %
Neutro Abs: 5.3 10*3/uL (ref 1.7–7.7)
Neutrophils Relative %: 71 %
Platelet Count: 213 10*3/uL (ref 150–400)
RBC: 4.36 MIL/uL (ref 4.22–5.81)
RDW: 19.5 % — ABNORMAL HIGH (ref 11.5–15.5)
WBC Count: 7.4 10*3/uL (ref 4.0–10.5)
nRBC: 0 % (ref 0.0–0.2)

## 2021-07-12 LAB — CMP (CANCER CENTER ONLY)
ALT: 22 U/L (ref 0–44)
AST: 20 U/L (ref 15–41)
Albumin: 3.6 g/dL (ref 3.5–5.0)
Alkaline Phosphatase: 110 U/L (ref 38–126)
Anion gap: 8 (ref 5–15)
BUN: 14 mg/dL (ref 6–20)
CO2: 29 mmol/L (ref 22–32)
Calcium: 9.3 mg/dL (ref 8.9–10.3)
Chloride: 102 mmol/L (ref 98–111)
Creatinine: 1.17 mg/dL (ref 0.61–1.24)
GFR, Estimated: >60 mL/min (ref >60)
Glucose, Bld: 102 mg/dL — ABNORMAL HIGH (ref 70–99)
Potassium: 5.8 mmol/L — ABNORMAL HIGH (ref 3.5–5.1)
Sodium: 139 mmol/L (ref 135–145)
Total Bilirubin: 0.3 mg/dL (ref 0.3–1.2)
Total Protein: 8.4 g/dL — ABNORMAL HIGH (ref 6.5–8.1)

## 2021-07-12 MED ORDER — LENVIMA (20 MG DAILY DOSE) 2 X 10 MG PO CPPK
20.0000 mg | ORAL_CAPSULE | Freq: Every day | ORAL | 1 refills | Status: DC
  Filled 2021-07-12: qty 60, 30d supply, fill #0
  Filled 2021-08-09: qty 60, 30d supply, fill #1

## 2021-07-12 MED ORDER — SODIUM CHLORIDE 0.9 % IV SOLN
Freq: Once | INTRAVENOUS | Status: AC
  Filled 2021-07-12: qty 250

## 2021-07-12 MED ORDER — SODIUM CHLORIDE 0.9 % IV SOLN
200.0000 mg | Freq: Once | INTRAVENOUS | Status: AC
  Administered 2021-07-12: 200 mg via INTRAVENOUS
  Filled 2021-07-12: qty 8

## 2021-07-12 NOTE — Progress Notes (Signed)
Dr. [name] aware of elevated K.  No new orders given.  Per MD ok to proceed with Keytruda.

## 2021-07-12 NOTE — Patient Instructions (Signed)
[facility]  Discharge Instructions: ?Thank you for choosing [facility] to provide your oncology and hematology care.  ? ?If you have a lab appointment with the Cancer Center, please go directly to the Cancer Center and check in at the registration area. ?  ?Wear comfortable clothing and clothing appropriate for easy access to any Portacath or PICC line.  ? ?We strive to give you quality time with your provider. You may need to reschedule your appointment if you arrive late (15 or more minutes).  Arriving late affects you and other patients whose appointments are after yours.  Also, if you miss three or more appointments without notifying the office, you may be dismissed from the clinic at the provider?s discretion.    ?  ?For prescription refill requests, have your pharmacy contact our office and allow 72 hours for refills to be completed.   ? ?Today you received the following chemotherapy and/or immunotherapy agents: Keytruda ?  ?To help prevent nausea and vomiting after your treatment, we encourage you to take your nausea medication as directed. ? ?BELOW ARE SYMPTOMS THAT SHOULD BE REPORTED IMMEDIATELY: ?*FEVER GREATER THAN 100.4 F (38 ?C) OR HIGHER ?*CHILLS OR SWEATING ?*NAUSEA AND VOMITING THAT IS NOT CONTROLLED WITH YOUR NAUSEA MEDICATION ?*UNUSUAL SHORTNESS OF BREATH ?*UNUSUAL BRUISING OR BLEEDING ?*URINARY PROBLEMS (pain or burning when urinating, or frequent urination) ?*BOWEL PROBLEMS (unusual diarrhea, constipation, pain near the anus) ?TENDERNESS IN MOUTH AND THROAT WITH OR WITHOUT PRESENCE OF ULCERS (sore throat, sores in mouth, or a toothache) ?UNUSUAL RASH, SWELLING OR PAIN  ?UNUSUAL VAGINAL DISCHARGE OR ITCHING  ? ?Items with * indicate a potential emergency and should be followed up as soon as possible or go to the Emergency Department if any problems should occur. ? ?Please show the CHEMOTHERAPY ALERT CARD or IMMUNOTHERAPY ALERT CARD at check-in to the  Emergency Department and triage nurse. ? ?Should you have questions after your visit or need to cancel or reschedule your appointment, please contact [facility]  Dept: [phone]  and follow the prompts.  Office hours are 8:00 a.m. to 4:30 p.m. Monday - Friday. Please note that voicemails left after 4:00 p.m. may not be returned until the following business day.  We are closed weekends and major holidays. You have access to a nurse at all times for urgent questions. Please call the main number to the clinic Dept: [phone] and follow the prompts. ? ? ?For any non-urgent questions, you may also contact your provider using MyChart. We now offer e-Visits for anyone 18 and older to request care online for non-urgent symptoms. For details visit [email or web address]. ?  ?Also download the MyChart app! Go to the app store, search "MyChart", open the app, select [facility], and log in with your MyChart username and password. ? ?Due to Covid, a mask is required upon entering the hospital/clinic. If you do not have a mask, one will be given to you upon arrival. For doctor visits, patients may have 1 support person aged 18 or older with them. For treatment visits, patients cannot have anyone with them due to current Covid guidelines and our immunocompromised population.  ? ?

## 2021-07-12 NOTE — Progress Notes (Signed)
Hematology and Oncology Follow Up   Keith [name] [id number] 25-Oct-1966 55 y.o. 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    Prior Therapy: Status post laparoscopic radical nephrectomy.  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  Patient status post thoracotomy for a synchronous metastatic lung lesions done October 2009.   Patient is status post stereotactic radiotherapy to pulmonary nodules in May of 2010. He is S/P Sutent 50 mg 4 weeks on 2 weeks off from 10/2010 to 03/2013. He progressed at that time.  He is S/P radiation to the right sacral bone between 4/22 to 4/30.  He is S/P XRT to the left shoulder 03/20/14 to 03/31/14. Votrient 800 mg by mouth daily from 03/2013 through 06/22/2015. Discontinued secondary to disease progression. Nivolumab 3 mg/kg given every 2 weeks started on 06/29/2015. He is status post 4 cycles completed 08/10/2015. He developed disease progression in September 2016.  Status post radiation therapy to the left mid fibula completed on 11/14/2015. He received a grade 1 fraction. He is status post radiation to the proximal and distal femur over 2 weeks and 10 fractions of total of 30 Gy.  This was completed in March 2019. 11.   Radiation therapy to the right tibia.  Last treatment completed in September 2019. 12. Cabometyx 60 mg daily started in November 2016.  Therapy discontinued and February 2022 due to progression of disease. 13.  Axitinib 5 mg twice a day started in March 2022.  Therapy discontinued in June 2022 due to progression of disease.   Current therapy: Pembrolizumab 200 mg every 3 weeks with lenvatinib 20 mg daily started on May 26, 2021.  He is here for cycle 3 of therapy.  Interim History: [name] returns today for a follow-up evaluation.  Since the last visit, he  reports no major changes in his health.  He continues to tolerate current therapy without any major complaints.  He denies any nausea, vomiting or abdominal pain.  He denies any hematochezia or melena.  He denies any hemoptysis.  His performance status quality of life continues to improve without any decline.  He denies any recent hospitalization or illnesses.    Medications: Reviewed without changes. Current Outpatient Medications  Medication Sig Dispense Refill   acetaminophen (TYLENOL) 500 MG tablet Take 500 mg by mouth every 6 (six) hours as needed for moderate pain or mild pain.     ALPRAZolam (XANAX) 1 MG tablet TAKE 1 TABLET BY MOUTH EVERY 8 HOURS AS NEEDED 90 tablet 0   carvedilol (COREG) 12.5 MG tablet TAKE 1 TABLET (12.5 MG TOTAL) BY MOUTH 2 (TWO) TIMES DAILY WITH A MEAL. 180 tablet 1   Cyanocobalamin (B-12) 1000 MCG TABS Take 1 tablet (1,000 mcg total) by mouth daily. 30 tablet    diphenhydrAMINE (BENADRYL) 25 MG tablet Take 25 mg by mouth daily.     diphenhydrAMINE (SOMINEX) 25 MG tablet Take 25 mg by mouth.     DULoxetine (CYMBALTA) 60 MG capsule Take 60 mg by mouth.     ferrous sulfate 325 (65 FE) MG tablet TAKE 1 TABLET BY MOUTH EVERY DAY WITH BREAKFAST (Patient taking differently: Take 325 mg by mouth daily.) 90 tablet 2   furosemide (LASIX) 20 MG tablet Take 1 tablet (20 mg total) by mouth every other day. 60 tablet 2  gabapentin (NEURONTIN) 300 MG capsule TAKE 1 CAPSULE BY MOUTH THREE TIMES A DAY 270 capsule 3   Glycerin-Hypromellose-PEG 400 ([employer] DRY EYE RELIEF) 0.2-0.2-1 % SOLN Place 1 drop into both eyes daily as needed (Dry eye).     hydrALAZINE (APRESOLINE) 25 MG tablet Take 1 tablet (25 mg total) by mouth 3 (three) times daily. 270 tablet 3   HYDROmorphone (DILAUDID) 8 MG tablet Take 8 mg by mouth 3 (three) times daily.     lenvatinib 20 mg daily dose (LENVIMA) 2 x 10 MG capsule Take 2 capsules (20 mg total) by mouth daily. 60 capsule 1   Magnesium Oxide 400 MG CAPS Take  1 capsule (400 mg total) by mouth daily. 7 capsule 0   ondansetron (ZOFRAN) 4 MG tablet Take one tablet every morning.  Can take one tablet later in the day on an as needed basis (Patient taking differently: Take one tablet every morning.  Can take one tablet later in the day on an as needed basis) 50 tablet 11   PAIN MANAGEMENT INTRATHECAL, IT, PUMP 1 each by Intrathecal route continuous. Intrathecal (IT) medication:   Morphine.  Can push every 4 hours     pantoprazole (PROTONIX) 40 MG tablet Take 1 tablet (40 mg total) by mouth daily. 30 tablet 3   Sodium Polystyrene Sulfonate (KAYEXALATE PO) Take 15 g by mouth.     SPS 15 GM/60ML suspension Take 15 g by mouth.     tiZANidine (ZANAFLEX) 4 MG tablet Take 4 mg by mouth 3 (three) times daily.     VELTASSA 8.4 g packet Take 1 packet by mouth daily.     No current facility-administered medications for this visit.     Allergies:  Allergies  Allergen Reactions   Ceftriaxone Hives    Other reaction(s): red streaks Other reaction(s): red streaks   Hydrocodone Swelling    Physical examination:    Blood pressure (!) 166/93, pulse 73, temperature 98.4 F (36.9 C), temperature source Oral, resp. rate 13, height 6\' 3"  (1.905 m), weight 162 lb 11.2 oz (73.8 kg), SpO2 99 %.     ECOG 1    General appearance: Comfortable appearing without any discomfort Head: Normocephalic without any trauma Oropharynx: Mucous membranes are moist and pink without any thrush or ulcers. Eyes: Pupils are equal and round reactive to light. Lymph nodes: No cervical, supraclavicular, inguinal or axillary lymphadenopathy.   Heart:regular rate and rhythm.  S1 and S2 without leg edema. Lung: Clear without any rhonchi or wheezes.  No dullness to percussion. Abdomin: Soft, nontender, nondistended with good bowel sounds.  No hepatosplenomegaly. Musculoskeletal: No joint deformity or effusion.  Full range of motion noted. Neurological: No deficits noted on motor,  sensory and deep tendon reflex exam. Skin: No petechial rash or dryness.  Appeared moist.                    Lab Results: Lab Results  Component Value Date   WBC 8.5 06/20/2021   HGB 11.0 (L) 06/20/2021   HCT 37.4 (L) 06/20/2021   MCV 89.5 06/20/2021   PLT 216 06/20/2021     Chemistry      Component Value Date/Time   NA 133 (L) 06/20/2021 1053   NA 138 02/27/2021 1102   NA 142 12/24/2017 1317   K 5.7 (H) 06/20/2021 1053   K 4.3 12/24/2017 1317   CL 102 06/20/2021 1053   CL 106 05/24/2013 1436   CO2 26 06/20/2021 1053  CO2 32 (H) 12/24/2017 1317   BUN 19 06/20/2021 1053   BUN 28 (H) 02/27/2021 1102   BUN 10.3 12/24/2017 1317   CREATININE 1.33 (H) 06/20/2021 1053   CREATININE 0.9 12/24/2017 1317   GLU 160 (H) 06/29/2015 0957      Component Value Date/Time   CALCIUM 8.8 (L) 06/20/2021 1053   CALCIUM 8.8 12/24/2017 1317   ALKPHOS 116 06/20/2021 1053   ALKPHOS 95 12/24/2017 1317   AST 19 06/20/2021 1053   AST 20 12/24/2017 1317   ALT 15 06/20/2021 1053   ALT 25 12/24/2017 1317   BILITOT 0.3 06/20/2021 1053   BILITOT 0.25 12/24/2017 1317       Impression and Plan:   55 year old man with   1.  Kidney cancer diagnosed in 2009.  He developed stage IV clear-cell renal cell carcinoma with pulmonary, bone and pancreatic disease subsequently.   His disease status was updated at this time and treatment choices were discussed.  He continues to tolerate the current therapy with lenvatinib and Pembrolizumab without any new concerns.  Risks and benefits of continuing this treatment were discussed at this time.  The plan is to obtain restaging scans after cycle 4 of therapy.  Switching to every 6 weeks Pembrolizumab could be considered after that.  He is agreeable to proceed.   2.  Pulmonary embolism: He is no longer on anticoagulation given his high risk of bleeding.  No thrombosis or bleeding episodes noted.   3.  Anemia: Resolved at this time with  hemoglobin normalizing.  4.    Lower extremity edema: Resolved at this time currently on diuretics.   5.  Prognosis: His disease is incurable although aggressive measures are warranted given his reasonable performance status.   6.  Hypercalcemia: Related to malignancy.  He is currently on Zometa.  Last calcium was close to normal range at this time.  This will be repeated in August at that 2022.  7.  Pathological fracture of the left humerus: Recovered at this time after surgical fixation.  8.  Hyperkalemia: We will continue to monitor and currently on medication for that.  This is related to his renal failure.  9.  Follow-up: He will return in 3 weeks for the next cycle of therapy.   30  minutes were spent on this encounter.  Time was dedicated to reviewing laboratory data, disease status update, treatment choices and addressing complications related to cancer and cancer therapy.  Zola Button, MD 07/12/2021 8:20 AM

## 2021-07-15 ENCOUNTER — Other Ambulatory Visit: Payer: Self-pay | Admitting: Gastroenterology

## 2021-07-15 ENCOUNTER — Other Ambulatory Visit (HOSPITAL_COMMUNITY): Payer: Self-pay

## 2021-07-16 ENCOUNTER — Other Ambulatory Visit (HOSPITAL_COMMUNITY): Payer: Self-pay

## 2021-07-18 ENCOUNTER — Other Ambulatory Visit: Payer: Self-pay | Admitting: Internal Medicine

## 2021-07-18 MED ORDER — ALPRAZOLAM 1 MG PO TABS: 1.0000 mg | ORAL_TABLET | Freq: Three times a day (TID) | ORAL | 0 refills | Status: DC | PRN

## 2021-07-26 ENCOUNTER — Telehealth: Payer: Self-pay | Admitting: Oncology

## 2021-07-26 NOTE — Telephone Encounter (Signed)
Called patient regarding upcoming appointments, patient is notified. 

## 2021-07-30 ENCOUNTER — Other Ambulatory Visit: Payer: Self-pay | Admitting: Oncology

## 2021-07-30 MED ORDER — ALPRAZOLAM 1 MG PO TABS: 1.0000 mg | ORAL_TABLET | Freq: Three times a day (TID) | ORAL | 0 refills | Status: DC | PRN

## 2021-07-31 DIAGNOSIS — N1831 Chronic kidney disease, stage 3a: Secondary | ICD-10-CM | POA: Diagnosis not present

## 2021-08-06 ENCOUNTER — Inpatient Hospital Stay: Payer: Medicare Other

## 2021-08-06 ENCOUNTER — Inpatient Hospital Stay: Payer: Medicare Other | Attending: Oncology

## 2021-08-06 ENCOUNTER — Inpatient Hospital Stay (HOSPITAL_BASED_OUTPATIENT_CLINIC_OR_DEPARTMENT_OTHER): Payer: Medicare Other | Admitting: Oncology

## 2021-08-06 ENCOUNTER — Ambulatory Visit: Payer: Medicare Other

## 2021-08-06 ENCOUNTER — Other Ambulatory Visit: Payer: Self-pay

## 2021-08-06 VITALS — BP 161/89 | HR 84 | Temp 97.9°F | Resp 17 | Ht 75.0 in | Wt 164.8 lb

## 2021-08-06 DIAGNOSIS — Z5112 Encounter for antineoplastic immunotherapy: Secondary | ICD-10-CM | POA: Insufficient documentation

## 2021-08-06 DIAGNOSIS — C649 Malignant neoplasm of unspecified kidney, except renal pelvis: Secondary | ICD-10-CM

## 2021-08-06 DIAGNOSIS — C7951 Secondary malignant neoplasm of bone: Secondary | ICD-10-CM | POA: Diagnosis not present

## 2021-08-06 DIAGNOSIS — C642 Malignant neoplasm of left kidney, except renal pelvis: Secondary | ICD-10-CM | POA: Insufficient documentation

## 2021-08-06 DIAGNOSIS — Z79899 Other long term (current) drug therapy: Secondary | ICD-10-CM | POA: Insufficient documentation

## 2021-08-06 LAB — CBC WITH DIFFERENTIAL (CANCER CENTER ONLY)
Abs Immature Granulocytes: 0.03 10*3/uL (ref 0.00–0.07)
Basophils Absolute: 0.1 10*3/uL (ref 0.0–0.1)
Basophils Relative: 1 %
Eosinophils Absolute: 0.3 10*3/uL (ref 0.0–0.5)
Eosinophils Relative: 3 %
HCT: 41.7 % (ref 39.0–52.0)
Hemoglobin: 13 g/dL (ref 13.0–17.0)
Immature Granulocytes: 0 %
Lymphocytes Relative: 11 %
Lymphs Abs: 0.9 10*3/uL (ref 0.7–4.0)
MCH: 28.1 pg (ref 26.0–34.0)
MCHC: 31.2 g/dL (ref 30.0–36.0)
MCV: 90.3 fL (ref 80.0–100.0)
Monocytes Absolute: 0.8 10*3/uL (ref 0.1–1.0)
Monocytes Relative: 10 %
Neutro Abs: 6 10*3/uL (ref 1.7–7.7)
Neutrophils Relative %: 75 %
Platelet Count: 203 10*3/uL (ref 150–400)
RBC: 4.62 MIL/uL (ref 4.22–5.81)
RDW: 18.7 % — ABNORMAL HIGH (ref 11.5–15.5)
WBC Count: 8 10*3/uL (ref 4.0–10.5)
nRBC: 0 % (ref 0.0–0.2)

## 2021-08-06 LAB — CMP (CANCER CENTER ONLY)
ALT: 17 U/L (ref 0–44)
AST: 14 U/L — ABNORMAL LOW (ref 15–41)
Albumin: 3.1 g/dL — ABNORMAL LOW (ref 3.5–5.0)
Alkaline Phosphatase: 123 U/L (ref 38–126)
Anion gap: 8 (ref 5–15)
BUN: 20 mg/dL (ref 6–20)
CO2: 24 mmol/L (ref 22–32)
Calcium: 9.4 mg/dL (ref 8.9–10.3)
Chloride: 104 mmol/L (ref 98–111)
Creatinine: 1.35 mg/dL — ABNORMAL HIGH (ref 0.61–1.24)
GFR, Estimated: >60 mL/min (ref >60)
Glucose, Bld: 123 mg/dL — ABNORMAL HIGH (ref 70–99)
Potassium: 5 mmol/L (ref 3.5–5.1)
Sodium: 136 mmol/L (ref 135–145)
Total Bilirubin: 0.3 mg/dL (ref 0.3–1.2)
Total Protein: 8.2 g/dL — ABNORMAL HIGH (ref 6.5–8.1)

## 2021-08-06 MED ORDER — SODIUM CHLORIDE 0.9 % IV SOLN: Freq: Once | INTRAVENOUS | Status: AC

## 2021-08-06 MED ORDER — SODIUM CHLORIDE 0.9 % IV SOLN
200.0000 mg | Freq: Once | INTRAVENOUS | Status: AC
  Administered 2021-08-06: 200 mg via INTRAVENOUS
  Filled 2021-08-06: qty 8

## 2021-08-06 NOTE — Patient Instructions (Signed)
[facility]  Discharge Instructions: ?Thank you for choosing [facility] to provide your oncology and hematology care.  ? ?If you have a lab appointment with the Cancer Center, please go directly to the Cancer Center and check in at the registration area. ?  ?Wear comfortable clothing and clothing appropriate for easy access to any Portacath or PICC line.  ? ?We strive to give you quality time with your provider. You may need to reschedule your appointment if you arrive late (15 or more minutes).  Arriving late affects you and other patients whose appointments are after yours.  Also, if you miss three or more appointments without notifying the office, you may be dismissed from the clinic at the provider?s discretion.    ?  ?For prescription refill requests, have your pharmacy contact our office and allow 72 hours for refills to be completed.   ? ?Today you received the following chemotherapy and/or immunotherapy agents: Keytruda ?  ?To help prevent nausea and vomiting after your treatment, we encourage you to take your nausea medication as directed. ? ?BELOW ARE SYMPTOMS THAT SHOULD BE REPORTED IMMEDIATELY: ?*FEVER GREATER THAN 100.4 F (38 ?C) OR HIGHER ?*CHILLS OR SWEATING ?*NAUSEA AND VOMITING THAT IS NOT CONTROLLED WITH YOUR NAUSEA MEDICATION ?*UNUSUAL SHORTNESS OF BREATH ?*UNUSUAL BRUISING OR BLEEDING ?*URINARY PROBLEMS (pain or burning when urinating, or frequent urination) ?*BOWEL PROBLEMS (unusual diarrhea, constipation, pain near the anus) ?TENDERNESS IN MOUTH AND THROAT WITH OR WITHOUT PRESENCE OF ULCERS (sore throat, sores in mouth, or a toothache) ?UNUSUAL RASH, SWELLING OR PAIN  ?UNUSUAL VAGINAL DISCHARGE OR ITCHING  ? ?Items with * indicate a potential emergency and should be followed up as soon as possible or go to the Emergency Department if any problems should occur. ? ?Please show the CHEMOTHERAPY ALERT CARD or IMMUNOTHERAPY ALERT CARD at check-in to the  Emergency Department and triage nurse. ? ?Should you have questions after your visit or need to cancel or reschedule your appointment, please contact [facility]  Dept: [phone]  and follow the prompts.  Office hours are 8:00 a.m. to 4:30 p.m. Monday - Friday. Please note that voicemails left after 4:00 p.m. may not be returned until the following business day.  We are closed weekends and major holidays. You have access to a nurse at all times for urgent questions. Please call the main number to the clinic Dept: [phone] and follow the prompts. ? ? ?For any non-urgent questions, you may also contact your provider using MyChart. We now offer e-Visits for anyone 18 and older to request care online for non-urgent symptoms. For details visit [email or web address]. ?  ?Also download the MyChart app! Go to the app store, search "MyChart", open the app, select [facility], and log in with your MyChart username and password. ? ?Due to Covid, a mask is required upon entering the hospital/clinic. If you do not have a mask, one will be given to you upon arrival. For doctor visits, patients may have 1 support person aged 18 or older with them. For treatment visits, patients cannot have anyone with them due to current Covid guidelines and our immunocompromised population.  ? ?

## 2021-08-06 NOTE — Progress Notes (Signed)
Hematology and Oncology Follow Up   Keith [name] [id number] Aug 04, 1966 55 y.o. 08/06/2021 8:33 AM Keith [name], MDPharr, [name], MD       Principle Diagnosis: 55 year old man with kidney cancer diagnosed in 2009.  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    Prior Therapy: Status post laparoscopic radical nephrectomy.  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  Patient status post thoracotomy for a synchronous metastatic lung lesions done October 2009.   Patient is status post stereotactic radiotherapy to pulmonary nodules in May of 2010. He is S/P Sutent 50 mg 4 weeks on 2 weeks off from 10/2010 to 03/2013. He progressed at that time.  He is S/P radiation to the right sacral bone between 4/22 to 4/30.  He is S/P XRT to the left shoulder 03/20/14 to 03/31/14. Votrient 800 mg by mouth daily from 03/2013 through 06/22/2015. Discontinued secondary to disease progression. Nivolumab 3 mg/kg given every 2 weeks started on 06/29/2015. He is status post 4 cycles completed 08/10/2015. He developed disease progression in September 2016.  Status post radiation therapy to the left mid fibula completed on 11/14/2015. He received a grade 1 fraction. He is status post radiation to the proximal and distal femur over 2 weeks and 10 fractions of total of 30 Gy.  This was completed in March 2019. 11.   Radiation therapy to the right tibia.  Last treatment completed in September 2019. 12. Cabometyx 60 mg daily started in November 2016.  Therapy discontinued and February 2022 due to progression of disease. 13.  Axitinib 5 mg twice a day started in March 2022.  Therapy discontinued in June 2022 due to progression of disease.   Current therapy: Pembrolizumab 200 mg every 3 weeks with lenvatinib 20 mg daily started on May 26, 2021.  He is here for cycle 4 of therapy.  Interim History: Mr. Yamada returns today for a follow-up visit.  Since the last  visit, he reports no major changes in his health.  He continues to tolerate current therapy without any complaints.  He denies any nausea, vomiting or abdominal pain.  He denies any hospitalizations or illnesses.  He denies any worsening edema, hematochezia or melena.   Medications: Unchanged on review. Current Outpatient Medications  Medication Sig Dispense Refill   acetaminophen (TYLENOL) 500 MG tablet Take 500 mg by mouth every 6 (six) hours as needed for moderate pain or mild pain.     ALPRAZolam (XANAX) 1 MG tablet Take 1 tablet (1 mg total) by mouth every 8 (eight) hours as needed. 90 tablet 0   carvedilol (COREG) 12.5 MG tablet TAKE 1 TABLET (12.5 MG TOTAL) BY MOUTH 2 (TWO) TIMES DAILY WITH A MEAL. 180 tablet 1   Cyanocobalamin (B-12) 1000 MCG TABS Take 1 tablet (1,000 mcg total) by mouth daily. 30 tablet    diphenhydrAMINE (BENADRYL) 25 MG tablet Take 25 mg by mouth daily.     diphenhydrAMINE (SOMINEX) 25 MG tablet Take 25 mg by mouth.     DULoxetine (CYMBALTA) 60 MG capsule Take 60 mg by mouth.     ferrous sulfate 325 (65 FE) MG tablet TAKE 1 TABLET BY MOUTH EVERY DAY WITH BREAKFAST 90 tablet 2   furosemide (LASIX) 20 MG tablet Take 1 tablet (20 mg total) by mouth every other day. 60 tablet 2   gabapentin (NEURONTIN) 300 MG capsule TAKE 1 CAPSULE BY MOUTH THREE TIMES A DAY 270 capsule 3   Glycerin-Hypromellose-PEG  400 ([facility] DRY EYE RELIEF) 0.2-0.2-1 % SOLN Place 1 drop into both eyes daily as needed (Dry eye).     hydrALAZINE (APRESOLINE) 25 MG tablet Take 1 tablet (25 mg total) by mouth 3 (three) times daily. 270 tablet 3   HYDROmorphone (DILAUDID) 8 MG tablet Take 8 mg by mouth 3 (three) times daily.     lenvatinib 20 mg daily dose (LENVIMA, 20 MG DAILY DOSE,) 2 x 10 MG capsule Take 2 capsules (20 mg total) by mouth daily. 60 capsule 1   Magnesium Oxide 400 MG CAPS Take 1 capsule (400 mg total) by mouth daily. 7 capsule 0   ondansetron (ZOFRAN) 4 MG tablet Take one tablet every  morning.  Can take one tablet later in the day on an as needed basis (Patient taking differently: Take one tablet every morning.  Can take one tablet later in the day on an as needed basis) 50 tablet 11   PAIN MANAGEMENT INTRATHECAL, IT, PUMP 1 each by Intrathecal route continuous. Intrathecal (IT) medication:   Morphine.  Can push every 4 hours     pantoprazole (PROTONIX) 40 MG tablet Take 1 tablet (40 mg total) by mouth daily. 30 tablet 3   Sodium Polystyrene Sulfonate (KAYEXALATE PO) Take 15 g by mouth.     SPS 15 GM/60ML suspension Take 15 g by mouth.     tiZANidine (ZANAFLEX) 4 MG tablet Take 4 mg by mouth 3 (three) times daily.     VELTASSA 8.4 g packet Take 1 packet by mouth daily.     No current facility-administered medications for this visit.     Allergies:  Allergies  Allergen Reactions   Ceftriaxone Hives    Other reaction(s): red streaks Other reaction(s): red streaks   Hydrocodone Swelling    Physical examination:    Blood pressure (!) 161/89, pulse 84, temperature 97.9 F (36.6 C), temperature source Oral, resp. rate 17, height 6\' 3"  (1.905 m), weight 164 lb 12.8 oz (74.8 kg), SpO2 95 %.      ECOG 1    General appearance: Alert, awake without any distress. Head: Atraumatic without abnormalities Oropharynx: Without any thrush or ulcers. Eyes: No scleral icterus. Lymph nodes: No lymphadenopathy noted in the cervical, supraclavicular, or axillary nodes Heart:regular rate and rhythm, without any murmurs or gallops.   Lung: Clear to auscultation without any rhonchi, wheezes or dullness to percussion. Abdomin: Soft, nontender without any shifting dullness or ascites. Musculoskeletal: No clubbing or cyanosis. Neurological: No motor or sensory deficits. Skin: No rashes or lesions.                    Lab Results: Lab Results  Component Value Date   WBC 7.4 07/12/2021   HGB 11.6 (L) 07/12/2021   HCT 39.1 07/12/2021   MCV 89.7 07/12/2021    PLT 213 07/12/2021     Chemistry      Component Value Date/Time   NA 139 07/12/2021 0842   NA 138 02/27/2021 1102   NA 142 12/24/2017 1317   K 5.8 (H) 07/12/2021 0842   K 4.3 12/24/2017 1317   CL 102 07/12/2021 0842   CL 106 05/24/2013 1436   CO2 29 07/12/2021 0842   CO2 32 (H) 12/24/2017 1317   BUN 14 07/12/2021 0842   BUN 28 (H) 02/27/2021 1102   BUN 10.3 12/24/2017 1317   CREATININE 1.17 07/12/2021 0842   CREATININE 0.9 12/24/2017 1317   GLU 160 (H) 06/29/2015 0957  Component Value Date/Time   CALCIUM 9.3 07/12/2021 0842   CALCIUM 8.8 12/24/2017 1317   ALKPHOS 110 07/12/2021 0842   ALKPHOS 95 12/24/2017 1317   AST 20 07/12/2021 0842   AST 20 12/24/2017 1317   ALT 22 07/12/2021 0842   ALT 25 12/24/2017 1317   BILITOT 0.3 07/12/2021 0842   BILITOT 0.25 12/24/2017 1317       Impression and Plan:   55 year old man with   1.  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   He continues to tolerate current therapy without any major complaints at this time.  Risks and benefits of continuing were reviewed.  Complication include GI toxicity as well as autoimmune complaints were reiterated.  The plan is to update his staging scans before the next visit.  Different salvage therapy option will be considered if he has progression of disease.   2.  Pulmonary embolism: No recent thrombosis or bleeding complications.  He is off anticoagulation.   3.  Anemia: Related to GI bleeding which continues to improve at this time.   4.    Lower extremity edema: Resolved at this time.   5.  Prognosis: Therapy remains palliative although aggressive measures are warranted given his reasonable performance status.   6.  Hypercalcemia: He will receive Zometa today if calcium is appropriate.  Complication putting hypocalcemia and osteonecrosis of the jaw were discussed.  7.  Autoimmune complications: He has not  experienced any at this time.  I will continue to educate him about potential issues including pneumonitis, colitis and thyroid disease.  8.  Hyperkalemia: Related to renal failure continues to follow with nephrology regarding this issue.  9.  Follow-up: In 3 weeks for repeat evaluation and next cycle of therapy.   30  minutes were spent on this visit.  The time was dedicated to reviewing his disease status, treatment choices and outlining future plan of care.  Zola Button, MD 08/06/2021 8:33 AM

## 2021-08-07 ENCOUNTER — Other Ambulatory Visit (HOSPITAL_COMMUNITY): Payer: Self-pay

## 2021-08-09 ENCOUNTER — Other Ambulatory Visit (HOSPITAL_COMMUNITY): Payer: Self-pay

## 2021-08-09 DIAGNOSIS — N1831 Chronic kidney disease, stage 3a: Secondary | ICD-10-CM | POA: Diagnosis not present

## 2021-08-09 DIAGNOSIS — N2581 Secondary hyperparathyroidism of renal origin: Secondary | ICD-10-CM | POA: Diagnosis not present

## 2021-08-09 DIAGNOSIS — E875 Hyperkalemia: Secondary | ICD-10-CM | POA: Diagnosis not present

## 2021-08-09 DIAGNOSIS — D631 Anemia in chronic kidney disease: Secondary | ICD-10-CM | POA: Diagnosis not present

## 2021-08-14 ENCOUNTER — Encounter: Payer: Self-pay | Admitting: Oncology

## 2021-08-14 ENCOUNTER — Other Ambulatory Visit (HOSPITAL_COMMUNITY): Payer: Self-pay

## 2021-08-20 ENCOUNTER — Ambulatory Visit (HOSPITAL_COMMUNITY)
Admission: RE | Admit: 2021-08-20 | Discharge: 2021-08-20 | Disposition: A | Discharge status: Home / Self Care | Payer: Medicare Other | Source: Ambulatory Visit | Attending: Oncology | Admitting: Oncology

## 2021-08-20 ENCOUNTER — Encounter (HOSPITAL_COMMUNITY): Payer: Self-pay

## 2021-08-20 ENCOUNTER — Other Ambulatory Visit: Payer: Self-pay

## 2021-08-20 DIAGNOSIS — J439 Emphysema, unspecified: Secondary | ICD-10-CM | POA: Insufficient documentation

## 2021-08-20 DIAGNOSIS — J90 Pleural effusion, not elsewhere classified: Secondary | ICD-10-CM | POA: Diagnosis not present

## 2021-08-20 DIAGNOSIS — I700 Atherosclerosis of aorta: Secondary | ICD-10-CM | POA: Insufficient documentation

## 2021-08-20 DIAGNOSIS — C7951 Secondary malignant neoplasm of bone: Secondary | ICD-10-CM | POA: Diagnosis not present

## 2021-08-20 DIAGNOSIS — K8689 Other specified diseases of pancreas: Secondary | ICD-10-CM | POA: Diagnosis not present

## 2021-08-20 DIAGNOSIS — I712 Thoracic aortic aneurysm, without rupture: Secondary | ICD-10-CM | POA: Insufficient documentation

## 2021-08-20 DIAGNOSIS — R221 Localized swelling, mass and lump, neck: Secondary | ICD-10-CM | POA: Diagnosis not present

## 2021-08-20 DIAGNOSIS — R911 Solitary pulmonary nodule: Secondary | ICD-10-CM | POA: Diagnosis not present

## 2021-08-20 DIAGNOSIS — K7689 Other specified diseases of liver: Secondary | ICD-10-CM | POA: Diagnosis not present

## 2021-08-20 DIAGNOSIS — C649 Malignant neoplasm of unspecified kidney, except renal pelvis: Secondary | ICD-10-CM | POA: Diagnosis not present

## 2021-08-20 DIAGNOSIS — Z905 Acquired absence of kidney: Secondary | ICD-10-CM | POA: Diagnosis not present

## 2021-08-20 MED ORDER — IOHEXOL 350 MG/ML SOLN
80.0000 mL | Freq: Once | INTRAVENOUS | Status: AC | PRN
  Administered 2021-08-20: 80 mL via INTRAVENOUS

## 2021-08-21 ENCOUNTER — Other Ambulatory Visit (HOSPITAL_COMMUNITY): Payer: Self-pay

## 2021-08-27 ENCOUNTER — Inpatient Hospital Stay (HOSPITAL_BASED_OUTPATIENT_CLINIC_OR_DEPARTMENT_OTHER): Payer: Medicare Other | Admitting: Oncology

## 2021-08-27 ENCOUNTER — Other Ambulatory Visit: Payer: Self-pay

## 2021-08-27 ENCOUNTER — Inpatient Hospital Stay: Payer: Medicare Other | Attending: Oncology

## 2021-08-27 ENCOUNTER — Inpatient Hospital Stay: Payer: Medicare Other

## 2021-08-27 VITALS — BP 144/94 | HR 69 | Temp 98.1°F | Resp 17 | Ht 75.0 in | Wt 163.5 lb

## 2021-08-27 DIAGNOSIS — Z79899 Other long term (current) drug therapy: Secondary | ICD-10-CM | POA: Insufficient documentation

## 2021-08-27 DIAGNOSIS — C787 Secondary malignant neoplasm of liver and intrahepatic bile duct: Secondary | ICD-10-CM | POA: Diagnosis not present

## 2021-08-27 DIAGNOSIS — C642 Malignant neoplasm of left kidney, except renal pelvis: Secondary | ICD-10-CM | POA: Insufficient documentation

## 2021-08-27 DIAGNOSIS — C7951 Secondary malignant neoplasm of bone: Secondary | ICD-10-CM | POA: Diagnosis not present

## 2021-08-27 DIAGNOSIS — C7800 Secondary malignant neoplasm of unspecified lung: Secondary | ICD-10-CM | POA: Insufficient documentation

## 2021-08-27 DIAGNOSIS — Z5112 Encounter for antineoplastic immunotherapy: Secondary | ICD-10-CM | POA: Insufficient documentation

## 2021-08-27 DIAGNOSIS — C649 Malignant neoplasm of unspecified kidney, except renal pelvis: Secondary | ICD-10-CM | POA: Diagnosis not present

## 2021-08-27 LAB — CMP (CANCER CENTER ONLY)
ALT: 25 U/L (ref 0–44)
AST: 22 U/L (ref 15–41)
Albumin: 3 g/dL — ABNORMAL LOW (ref 3.5–5.0)
Alkaline Phosphatase: 116 U/L (ref 38–126)
Anion gap: 9 (ref 5–15)
BUN: 17 mg/dL (ref 6–20)
CO2: 25 mmol/L (ref 22–32)
Calcium: 9.2 mg/dL (ref 8.9–10.3)
Chloride: 101 mmol/L (ref 98–111)
Creatinine: 1.32 mg/dL — ABNORMAL HIGH (ref 0.61–1.24)
GFR, Estimated: >60 mL/min (ref >60)
Glucose, Bld: 99 mg/dL (ref 70–99)
Potassium: 5 mmol/L (ref 3.5–5.1)
Sodium: 135 mmol/L (ref 135–145)
Total Bilirubin: 0.3 mg/dL (ref 0.3–1.2)
Total Protein: 8.1 g/dL (ref 6.5–8.1)

## 2021-08-27 LAB — CBC WITH DIFFERENTIAL (CANCER CENTER ONLY)
Abs Immature Granulocytes: 0.03 10*3/uL (ref 0.00–0.07)
Basophils Absolute: 0.1 10*3/uL (ref 0.0–0.1)
Basophils Relative: 1 %
Eosinophils Absolute: 0.4 10*3/uL (ref 0.0–0.5)
Eosinophils Relative: 5 %
HCT: 40 % (ref 39.0–52.0)
Hemoglobin: 12 g/dL — ABNORMAL LOW (ref 13.0–17.0)
Immature Granulocytes: 0 %
Lymphocytes Relative: 17 %
Lymphs Abs: 1.5 10*3/uL (ref 0.7–4.0)
MCH: 27.9 pg (ref 26.0–34.0)
MCHC: 30 g/dL (ref 30.0–36.0)
MCV: 93 fL (ref 80.0–100.0)
Monocytes Absolute: 0.7 10*3/uL (ref 0.1–1.0)
Monocytes Relative: 8 %
Neutro Abs: 6 10*3/uL (ref 1.7–7.7)
Neutrophils Relative %: 69 %
Platelet Count: 194 10*3/uL (ref 150–400)
RBC: 4.3 MIL/uL (ref 4.22–5.81)
RDW: 17.5 % — ABNORMAL HIGH (ref 11.5–15.5)
WBC Count: 8.7 10*3/uL (ref 4.0–10.5)
nRBC: 0 % (ref 0.0–0.2)

## 2021-08-27 MED ORDER — SODIUM CHLORIDE 0.9 % IV SOLN
400.0000 mg | Freq: Once | INTRAVENOUS | Status: AC
  Administered 2021-08-27: 400 mg via INTRAVENOUS
  Filled 2021-08-27: qty 16

## 2021-08-27 MED ORDER — SODIUM CHLORIDE 0.9 % IV SOLN: Freq: Once | INTRAVENOUS | Status: AC

## 2021-08-27 NOTE — Patient Instructions (Signed)
[facility]  Discharge Instructions: ?Thank you for choosing [facility] to provide your oncology and hematology care.  ? ?If you have a lab appointment with the Cancer Center, please go directly to the Cancer Center and check in at the registration area. ?  ?Wear comfortable clothing and clothing appropriate for easy access to any Portacath or PICC line.  ? ?We strive to give you quality time with your provider. You may need to reschedule your appointment if you arrive late (15 or more minutes).  Arriving late affects you and other patients whose appointments are after yours.  Also, if you miss three or more appointments without notifying the office, you may be dismissed from the clinic at the provider?s discretion.    ?  ?For prescription refill requests, have your pharmacy contact our office and allow 72 hours for refills to be completed.   ? ?Today you received the following chemotherapy and/or immunotherapy agents: Keytruda ?  ?To help prevent nausea and vomiting after your treatment, we encourage you to take your nausea medication as directed. ? ?BELOW ARE SYMPTOMS THAT SHOULD BE REPORTED IMMEDIATELY: ?*FEVER GREATER THAN 100.4 F (38 ?C) OR HIGHER ?*CHILLS OR SWEATING ?*NAUSEA AND VOMITING THAT IS NOT CONTROLLED WITH YOUR NAUSEA MEDICATION ?*UNUSUAL SHORTNESS OF BREATH ?*UNUSUAL BRUISING OR BLEEDING ?*URINARY PROBLEMS (pain or burning when urinating, or frequent urination) ?*BOWEL PROBLEMS (unusual diarrhea, constipation, pain near the anus) ?TENDERNESS IN MOUTH AND THROAT WITH OR WITHOUT PRESENCE OF ULCERS (sore throat, sores in mouth, or a toothache) ?UNUSUAL RASH, SWELLING OR PAIN  ?UNUSUAL VAGINAL DISCHARGE OR ITCHING  ? ?Items with * indicate a potential emergency and should be followed up as soon as possible or go to the Emergency Department if any problems should occur. ? ?Please show the CHEMOTHERAPY ALERT CARD or IMMUNOTHERAPY ALERT CARD at check-in to the  Emergency Department and triage nurse. ? ?Should you have questions after your visit or need to cancel or reschedule your appointment, please contact [facility]  Dept: [phone]  and follow the prompts.  Office hours are 8:00 a.m. to 4:30 p.m. Monday - Friday. Please note that voicemails left after 4:00 p.m. may not be returned until the following business day.  We are closed weekends and major holidays. You have access to a nurse at all times for urgent questions. Please call the main number to the clinic Dept: [phone] and follow the prompts. ? ? ?For any non-urgent questions, you may also contact your provider using MyChart. We now offer e-Visits for anyone 18 and older to request care online for non-urgent symptoms. For details visit [email or web address]. ?  ?Also download the MyChart app! Go to the app store, search "MyChart", open the app, select [facility], and log in with your MyChart username and password. ? ?Due to Covid, a mask is required upon entering the hospital/clinic. If you do not have a mask, one will be given to you upon arrival. For doctor visits, patients may have 1 support person aged 18 or older with them. For treatment visits, patients cannot have anyone with them due to current Covid guidelines and our immunocompromised population.  ? ?

## 2021-08-27 NOTE — Progress Notes (Signed)
Corr Ca = 10 today. No Zometa today per Dr. [name].  Kennith Center, Pharm.D., CPP 08/27/2021@1 :04 PM

## 2021-08-27 NOTE — Progress Notes (Signed)
Hematology and Oncology Follow Up   Keith [name] [id number] 04-Feb-1966 55 y.o. 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  Prior Therapy: Status post laparoscopic radical nephrectomy.  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  Patient status post thoracotomy for a synchronous metastatic lung lesions done October 2009.   Patient is status post stereotactic radiotherapy to pulmonary nodules in May of 2010. He is S/P Sutent 50 mg 4 weeks on 2 weeks off from 10/2010 to 03/2013. He progressed at that time.  He is S/P radiation to the right sacral bone between 4/22 to 4/30.  He is S/P XRT to the left shoulder 03/20/14 to 03/31/14. Votrient 800 mg by mouth daily from 03/2013 through 06/22/2015. Discontinued secondary to disease progression. Nivolumab 3 mg/kg given every 2 weeks started on 06/29/2015. He is status post 4 cycles completed 08/10/2015. He developed disease progression in September 2016.  Status post radiation therapy to the left mid fibula completed on 11/14/2015. He received a grade 1 fraction. He is status post radiation to the proximal and distal femur over 2 weeks and 10 fractions of total of 30 Gy.  This was completed in March 2019. 11.   Radiation therapy to the right tibia.  Last treatment completed in September 2019. 12. Cabometyx 60 mg daily started in November 2016.  Therapy discontinued and February 2022 due to progression of disease. 13.  Axitinib 5 mg twice a day started in March 2022.  Therapy discontinued in June 2022 due to progression of disease.   Current therapy: Pembrolizumab 200 mg every 3 weeks with lenvatinib 20 mg daily started on May 26, 2021.  He is here for cycle 5 of therapy.  Interim History: Keith [name] is here for repeat evaluation.  Since the last visit, he continues to  tolerate the current therapy without any major complaints.  He denies any nausea, vomiting or abdominal pain.  He denies any recent hospitalizations or illnesses.  He denies any worsening bone pain or pathological fractures.  Denies any GI complications,weight loss or worsening edema.   Medications: Updated on review. Current Outpatient Medications  Medication Sig Dispense Refill   acetaminophen (TYLENOL) 500 MG tablet Take 500 mg by mouth every 6 (six) hours as needed for moderate pain or mild pain.     ALPRAZolam (XANAX) 1 MG tablet Take 1 tablet (1 mg total) by mouth every 8 (eight) hours as needed. 90 tablet 0   carvedilol (COREG) 12.5 MG tablet TAKE 1 TABLET (12.5 MG TOTAL) BY MOUTH 2 (TWO) TIMES DAILY WITH A MEAL. 180 tablet 1   Cyanocobalamin (B-12) 1000 MCG TABS Take 1 tablet (1,000 mcg total) by mouth daily. 30 tablet    diphenhydrAMINE (BENADRYL) 25 MG tablet Take 25 mg by mouth daily.     diphenhydrAMINE (SOMINEX) 25 MG tablet Take 25 mg by mouth.     DULoxetine (CYMBALTA) 60 MG capsule Take 60 mg by mouth.     ferrous sulfate 325 (65 FE) MG tablet TAKE 1 TABLET BY MOUTH EVERY DAY WITH BREAKFAST 90 tablet 2   furosemide (LASIX) 20 MG tablet Take 1 tablet (20 mg total) by mouth every other day. 60 tablet 2   gabapentin (NEURONTIN) 300 MG capsule TAKE 1 CAPSULE BY MOUTH THREE TIMES A DAY 270 capsule 3   Glycerin-Hypromellose-PEG 400 ([facility]  DRY EYE RELIEF) 0.2-0.2-1 % SOLN Place 1 drop into both eyes daily as needed (Dry eye).     hydrALAZINE (APRESOLINE) 25 MG tablet Take 1 tablet (25 mg total) by mouth 3 (three) times daily. 270 tablet 3   HYDROmorphone (DILAUDID) 8 MG tablet Take 8 mg by mouth 3 (three) times daily.     lenvatinib 20 mg daily dose (LENVIMA, 20 MG DAILY DOSE,) 2 x 10 MG capsule Take 2 capsules (20 mg total) by mouth daily. 60 capsule 1   Magnesium Oxide 400 MG CAPS Take 1 capsule (400 mg total) by mouth daily. 7 capsule 0   ondansetron (ZOFRAN) 4 MG tablet Take one  tablet every morning.  Can take one tablet later in the day on an as needed basis (Patient taking differently: Take one tablet every morning.  Can take one tablet later in the day on an as needed basis) 50 tablet 11   PAIN MANAGEMENT INTRATHECAL, IT, PUMP 1 each by Intrathecal route continuous. Intrathecal (IT) medication:   Morphine.  Can push every 4 hours     pantoprazole (PROTONIX) 40 MG tablet Take 1 tablet (40 mg total) by mouth daily. 30 tablet 3   Sodium Polystyrene Sulfonate (KAYEXALATE PO) Take 15 g by mouth.     SPS 15 GM/60ML suspension Take 15 g by mouth.     tiZANidine (ZANAFLEX) 4 MG tablet Take 4 mg by mouth 3 (three) times daily.     VELTASSA 8.4 g packet Take 1 packet by mouth daily.     No current facility-administered medications for this visit.     Allergies:  Allergies  Allergen Reactions   Ceftriaxone Hives    Other reaction(s): red streaks Other reaction(s): red streaks   Hydrocodone Swelling    Physical examination:    Blood pressure (!) 144/94, pulse 69, temperature 98.1 F (36.7 C), temperature source Oral, resp. rate 17, height 6\' 3"  (1.905 m), weight 163 lb 8 oz (74.2 kg), SpO2 100 %.      ECOG 1    General appearance: Comfortable appearing without any discomfort Head: Normocephalic without any trauma Oropharynx: Mucous membranes are moist and pink without any thrush or ulcers. Eyes: Pupils are equal and round reactive to light. Lymph nodes: No cervical, supraclavicular, inguinal or axillary lymphadenopathy.   Heart:regular rate and rhythm.  S1 and S2 without leg edema. Lung: Clear without any rhonchi or wheezes.  No dullness to percussion. Abdomin: Soft, nontender, nondistended with good bowel sounds.  No hepatosplenomegaly. Musculoskeletal: No joint deformity or effusion.  Full range of motion noted. Neurological: No deficits noted on motor, sensory and deep tendon reflex exam. Skin: No petechial rash or dryness.  Appeared moist.                      Lab Results: Lab Results  Component Value Date   WBC 8.7 08/27/2021   HGB 12.0 (L) 08/27/2021   HCT 40.0 08/27/2021   MCV 93.0 08/27/2021   PLT 194 08/27/2021     Chemistry      Component Value Date/Time   NA 136 08/06/2021 0823   NA 138 02/27/2021 1102   NA 142 12/24/2017 1317   K 5.0 08/06/2021 0823   K 4.3 12/24/2017 1317   CL 104 08/06/2021 0823   CL 106 05/24/2013 1436   CO2 24 08/06/2021 0823   CO2 32 (H) 12/24/2017 1317   BUN 20 08/06/2021 0823   BUN 28 (H) 02/27/2021 1102  BUN 10.3 12/24/2017 1317   CREATININE 1.35 (H) 08/06/2021 0823   CREATININE 0.9 12/24/2017 1317   GLU 160 (H) 06/29/2015 0957      Component Value Date/Time   CALCIUM 9.4 08/06/2021 0823   CALCIUM 8.8 12/24/2017 1317   ALKPHOS 123 08/06/2021 0823   ALKPHOS 95 12/24/2017 1317   AST 14 (L) 08/06/2021 0823   AST 20 12/24/2017 1317   ALT 17 08/06/2021 0823   ALT 25 12/24/2017 1317   BILITOT 0.3 08/06/2021 0823   BILITOT 0.25 12/24/2017 1317     IMPRESSION: 1. Somewhat challenging direct comparison to prior exams, given lack of arterial phase imaging back to 06/19/2020. 2. Given this limitation, pancreatic and peripancreatic disease is felt to be slightly decreased, with persistent pancreatic duct obstruction. 3. Multiple hypervascular foci within the liver, suspicious for hepatic metastasis. These are felt to be slightly increased in number since 06/19/2020. 4. Similar right cardiophrenic angle adenopathy, suspicious based on location and the extent of enhancement. 5. 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 6. Left nephrectomy, without local recurrence. 7.  Possible constipation. 8.  Aortic Atherosclerosis (ICD10-I70.0).  Impression and Plan:   55 year old man with   1.  Kidney cancer diagnosed in 2009.  He presented with stage IV clear-cell disease and pulmonary involvement.   CT scan obtained on August 20, 2021 was personally reviewed and  showed overall stable disease without any clear signs of progression that requires treatment change.  Risks and benefits of continuing these treatments were reiterated.  Complications include GI toxicity as well as autoimmune issues were reviewed.  At this time he is agreeable to continue.  We will switch Pembrolizumab to every 6 weeks for is for scheduling preferences.   2.  Pulmonary embolism: He is off anticoagulation without any recent thrombosis.   3.  Anemia: Hemoglobin remains close to normal range without any need for intervention.    4.  Prognosis: His disease is incurable although aggressive measures are warranted given his reasonable performance status.   5.  Anxiety: Prescription for Xanax will be available to him.  6.  Autoimmune complications: he has not experienced any at this time.  We will continue to educate him about potential complication related pneumonitis, colitis and thyroid disease.   7.  Follow-up: In 6 weeks for the next cycle of therapy.   30  minutes were dedicated to this visit.  The time was spent on reviewing laboratory data, disease status update and outlining future plan of care.  Zola Button, MD 08/27/2021 12:17 PM

## 2021-08-29 ENCOUNTER — Other Ambulatory Visit: Payer: Self-pay | Admitting: Oncology

## 2021-09-06 ENCOUNTER — Other Ambulatory Visit: Payer: Self-pay | Admitting: Oncology

## 2021-09-06 ENCOUNTER — Other Ambulatory Visit (HOSPITAL_COMMUNITY): Payer: Self-pay

## 2021-09-06 MED ORDER — LENVIMA (20 MG DAILY DOSE) 2 X 10 MG PO CPPK
20.0000 mg | ORAL_CAPSULE | Freq: Every day | ORAL | 1 refills | Status: DC
  Filled 2021-09-06 – 2021-09-26 (×2): qty 60, 30d supply, fill #0
  Filled 2021-10-15: qty 60, 30d supply, fill #1

## 2021-09-10 ENCOUNTER — Other Ambulatory Visit (HOSPITAL_COMMUNITY): Payer: Self-pay

## 2021-09-12 ENCOUNTER — Other Ambulatory Visit (HOSPITAL_COMMUNITY): Payer: Self-pay

## 2021-09-17 ENCOUNTER — Other Ambulatory Visit (HOSPITAL_COMMUNITY): Payer: Self-pay

## 2021-09-24 ENCOUNTER — Other Ambulatory Visit (HOSPITAL_COMMUNITY): Payer: Self-pay

## 2021-09-26 ENCOUNTER — Other Ambulatory Visit (HOSPITAL_COMMUNITY): Payer: Self-pay

## 2021-09-27 ENCOUNTER — Other Ambulatory Visit (HOSPITAL_COMMUNITY): Payer: Self-pay

## 2021-09-30 ENCOUNTER — Other Ambulatory Visit: Payer: Self-pay | Admitting: Oncology

## 2021-09-30 ENCOUNTER — Telehealth: Payer: Self-pay | Admitting: *Deleted

## 2021-09-30 NOTE — Telephone Encounter (Signed)
Pt left vm for refill on Xanax. Message to [name].

## 2021-10-07 DIAGNOSIS — G894 Chronic pain syndrome: Secondary | ICD-10-CM | POA: Diagnosis not present

## 2021-10-08 ENCOUNTER — Other Ambulatory Visit: Payer: Self-pay

## 2021-10-08 ENCOUNTER — Inpatient Hospital Stay: Payer: Medicare Other

## 2021-10-08 ENCOUNTER — Inpatient Hospital Stay: Payer: Medicare Other | Attending: Oncology

## 2021-10-08 ENCOUNTER — Inpatient Hospital Stay (HOSPITAL_BASED_OUTPATIENT_CLINIC_OR_DEPARTMENT_OTHER): Payer: Medicare Other | Admitting: Oncology

## 2021-10-08 VITALS — BP 145/92 | HR 78 | Temp 97.8°F | Resp 17 | Ht 75.0 in | Wt 165.5 lb

## 2021-10-08 DIAGNOSIS — C7951 Secondary malignant neoplasm of bone: Secondary | ICD-10-CM | POA: Insufficient documentation

## 2021-10-08 DIAGNOSIS — Z79899 Other long term (current) drug therapy: Secondary | ICD-10-CM | POA: Insufficient documentation

## 2021-10-08 DIAGNOSIS — Z5112 Encounter for antineoplastic immunotherapy: Secondary | ICD-10-CM | POA: Insufficient documentation

## 2021-10-08 DIAGNOSIS — C649 Malignant neoplasm of unspecified kidney, except renal pelvis: Secondary | ICD-10-CM

## 2021-10-08 DIAGNOSIS — C7800 Secondary malignant neoplasm of unspecified lung: Secondary | ICD-10-CM | POA: Diagnosis not present

## 2021-10-08 DIAGNOSIS — C642 Malignant neoplasm of left kidney, except renal pelvis: Secondary | ICD-10-CM | POA: Insufficient documentation

## 2021-10-08 LAB — CMP (CANCER CENTER ONLY)
ALT: 19 U/L (ref 0–44)
AST: 19 U/L (ref 15–41)
Albumin: 2.8 g/dL — ABNORMAL LOW (ref 3.5–5.0)
Alkaline Phosphatase: 102 U/L (ref 38–126)
Anion gap: 10 (ref 5–15)
BUN: 11 mg/dL (ref 6–20)
CO2: 26 mmol/L (ref 22–32)
Calcium: 8.9 mg/dL (ref 8.9–10.3)
Chloride: 100 mmol/L (ref 98–111)
Creatinine: 1.22 mg/dL (ref 0.61–1.24)
GFR, Estimated: >60 mL/min (ref >60)
Glucose, Bld: 178 mg/dL — ABNORMAL HIGH (ref 70–99)
Potassium: 3.9 mmol/L (ref 3.5–5.1)
Sodium: 136 mmol/L (ref 135–145)
Total Bilirubin: 0.2 mg/dL — ABNORMAL LOW (ref 0.3–1.2)
Total Protein: 7.6 g/dL (ref 6.5–8.1)

## 2021-10-08 LAB — CBC WITH DIFFERENTIAL (CANCER CENTER ONLY)
Abs Immature Granulocytes: 0.01 10*3/uL (ref 0.00–0.07)
Basophils Absolute: 0.1 10*3/uL (ref 0.0–0.1)
Basophils Relative: 1 %
Eosinophils Absolute: 0.3 10*3/uL (ref 0.0–0.5)
Eosinophils Relative: 5 %
HCT: 39.3 % (ref 39.0–52.0)
Hemoglobin: 12.5 g/dL — ABNORMAL LOW (ref 13.0–17.0)
Immature Granulocytes: 0 %
Lymphocytes Relative: 16 %
Lymphs Abs: 1 10*3/uL (ref 0.7–4.0)
MCH: 29.1 pg (ref 26.0–34.0)
MCHC: 31.8 g/dL (ref 30.0–36.0)
MCV: 91.6 fL (ref 80.0–100.0)
Monocytes Absolute: 0.4 10*3/uL (ref 0.1–1.0)
Monocytes Relative: 7 %
Neutro Abs: 4.3 10*3/uL (ref 1.7–7.7)
Neutrophils Relative %: 71 %
Platelet Count: 144 10*3/uL — ABNORMAL LOW (ref 150–400)
RBC: 4.29 MIL/uL (ref 4.22–5.81)
RDW: 16.5 % — ABNORMAL HIGH (ref 11.5–15.5)
WBC Count: 6 10*3/uL (ref 4.0–10.5)
nRBC: 0 % (ref 0.0–0.2)

## 2021-10-08 MED ORDER — SODIUM CHLORIDE 0.9 % IV SOLN: Freq: Once | INTRAVENOUS | Status: DC

## 2021-10-08 MED ORDER — SODIUM CHLORIDE 0.9 % IV SOLN: Freq: Once | INTRAVENOUS | Status: AC

## 2021-10-08 MED ORDER — SODIUM CHLORIDE 0.9 % IV SOLN
400.0000 mg | Freq: Once | INTRAVENOUS | Status: AC
  Administered 2021-10-08: 400 mg via INTRAVENOUS
  Filled 2021-10-08: qty 16

## 2021-10-08 MED ORDER — ZOLEDRONIC ACID 4 MG/100ML IV SOLN
4.0000 mg | Freq: Once | INTRAVENOUS | Status: AC
  Administered 2021-10-08: 4 mg via INTRAVENOUS
  Filled 2021-10-08: qty 100

## 2021-10-08 NOTE — Patient Instructions (Signed)
[facility]  Discharge Instructions: Thank you for choosing [facility] to provide your oncology and hematology care.   If you have a lab appointment with the [facility], please go directly to the [facility] and check in at the registration area.   Wear comfortable clothing and clothing appropriate for easy access to any Portacath or PICC line.   We strive to give you quality time with your provider. You may need to reschedule your appointment if you arrive late (15 or more minutes).  Arriving late affects you and other patients whose appointments are after yours.  Also, if you miss three or more appointments without notifying the office, you may be dismissed from the clinic at the provider's discretion.      For prescription refill requests, have your pharmacy contact our office and allow 72 hours for refills to be completed.    Today you received the following chemotherapy and/or immunotherapy agents :  Pembrolizumab      To help prevent nausea and vomiting after your treatment, we encourage you to take your nausea medication as directed.  BELOW ARE SYMPTOMS THAT SHOULD BE REPORTED IMMEDIATELY: *FEVER GREATER THAN 100.4 F (38 C) OR HIGHER *CHILLS OR SWEATING *NAUSEA AND VOMITING THAT IS NOT CONTROLLED WITH YOUR NAUSEA MEDICATION *UNUSUAL SHORTNESS OF BREATH *UNUSUAL BRUISING OR BLEEDING *URINARY PROBLEMS (pain or burning when urinating, or frequent urination) *BOWEL PROBLEMS (unusual diarrhea, constipation, pain near the anus) TENDERNESS IN MOUTH AND THROAT WITH OR WITHOUT PRESENCE OF ULCERS (sore throat, sores in mouth, or a toothache) UNUSUAL RASH, SWELLING OR PAIN  UNUSUAL VAGINAL DISCHARGE OR ITCHING   Items with * indicate a potential emergency and should be followed up as soon as possible or go to the Emergency Department if any problems should occur.  Please show the CHEMOTHERAPY ALERT CARD or IMMUNOTHERAPY ALERT CARD at  check-in to the Emergency Department and triage nurse.  Should you have questions after your visit or need to cancel or reschedule your appointment, please contact [facility]  Dept: [phone]  and follow the prompts.  Office hours are 8:00 a.m. to 4:30 p.m. Monday - Friday. Please note that voicemails left after 4:00 p.m. may not be returned until the following business day.  We are closed weekends and major holidays. You have access to a nurse at all times for urgent questions. Please call the main number to the clinic Dept: [phone] and follow the prompts.   For any non-urgent questions, you may also contact your provider using MyChart. We now offer e-Visits for anyone 70 and older to request care online for non-urgent symptoms. For details visit [email or web address].   Also download the MyChart app! Go to the app store, search "MyChart", open the app, select [facility], and log in with your MyChart username and password.  Due to Covid, a mask is required upon entering the hospital/clinic. If you do not have a mask, one will be given to you upon arrival. For doctor visits, patients may have 1 support person aged 26 or older with them. For treatment visits, patients cannot have anyone with them due to current Covid guidelines and our immunocompromised population.

## 2021-10-08 NOTE — Progress Notes (Signed)
Hematology and Oncology Follow Up   Keith [name] [id number] [birth year] 55 y.o. 10/08/2021 7:55 AM Keith [name], MDPharr, Keith Jew, MD       Principle Diagnosis: 55 year old man with kidney cancer diagnosed in 2009.  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  Prior Therapy: Status post laparoscopic radical nephrectomy.  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  Patient status post thoracotomy for a synchronous metastatic lung lesions done October 2009.   Patient is status post stereotactic radiotherapy to pulmonary nodules in May of 2010. He is S/P Sutent 50 mg 4 weeks on 2 weeks off from 10/2010 to 03/2013. He progressed at that time.  He is S/P radiation to the right sacral bone between 4/22 to 4/30.  He is S/P XRT to the left shoulder 03/20/14 to 03/31/14. Votrient 800 mg by mouth daily from 03/2013 through 06/22/2015. Discontinued secondary to disease progression. Nivolumab 3 mg/kg given every 2 weeks started on 06/29/2015. He is status post 4 cycles completed 08/10/2015. He developed disease progression in September 2016.  Status post radiation therapy to the left mid fibula completed on 11/14/2015. He received a grade 1 fraction. He is status post radiation to the proximal and distal femur over 2 weeks and 10 fractions of total of 30 Gy.  This was completed in March 2019. 11.   Radiation therapy to the right tibia.  Last treatment completed in September 2019. 12. Cabometyx 60 mg daily started in November 2016.  Therapy discontinued and February 2022 due to progression of disease. 13.  Axitinib 5 mg twice a day started in March 2022.  Therapy discontinued in June 2022 due to progression of disease.   Current therapy: Pembrolizumab 200 mg every 3 weeks with lenvatinib 20 mg daily started on May 26, 2021.  He received 400 mg after cycle 5 and will be repeated every 6 weeks.  He is here for cycle 6 of  therapy.  Interim History: Keith [name] returns today for a follow-up visit.  Since the last visit, he reports no major changes in his health.  He continues to tolerate treatment without any complaints.  He denies any nausea, vomiting or abdominal pain.  He denies any recent hospitalizations or illnesses.  He denies any worsening bone pain or pathological fractures.  He denies any dyspnea on exertion.   Medications: Reviewed without changes. Current Outpatient Medications  Medication Sig Dispense Refill   acetaminophen (TYLENOL) 500 MG tablet Take 500 mg by mouth every 6 (six) hours as needed for moderate pain or mild pain.     ALPRAZolam (XANAX) 1 MG tablet TAKE 1 TABLET BY MOUTH EVERY 8 HOURS AS NEEDED 90 tablet 0   carvedilol (COREG) 12.5 MG tablet TAKE 1 TABLET (12.5 MG TOTAL) BY MOUTH 2 (TWO) TIMES DAILY WITH A MEAL. 180 tablet 1   Cyanocobalamin (B-12) 1000 MCG TABS Take 1 tablet (1,000 mcg total) by mouth daily. 30 tablet    diphenhydrAMINE (BENADRYL) 25 MG tablet Take 25 mg by mouth daily.     diphenhydrAMINE (SOMINEX) 25 MG tablet Take 25 mg by mouth.     DULoxetine (CYMBALTA) 60 MG capsule Take 60 mg by mouth.     ferrous sulfate 325 (65 FE) MG tablet TAKE 1 TABLET BY MOUTH EVERY DAY WITH BREAKFAST 90 tablet 2   furosemide (LASIX) 20 MG tablet Take 1 tablet (20 mg total) by mouth every other day. 60 tablet 2  gabapentin (NEURONTIN) 300 MG capsule TAKE 1 CAPSULE BY MOUTH THREE TIMES A DAY 270 capsule 3   Glycerin-Hypromellose-PEG 400 ([facility] DRY EYE RELIEF) 0.2-0.2-1 % SOLN Place 1 drop into both eyes daily as needed (Dry eye).     hydrALAZINE (APRESOLINE) 25 MG tablet Take 1 tablet (25 mg total) by mouth 3 (three) times daily. 270 tablet 3   HYDROmorphone (DILAUDID) 8 MG tablet Take 8 mg by mouth 3 (three) times daily.     lenvatinib 20 mg daily dose (LENVIMA, 20 MG DAILY DOSE,) 2 x 10 MG capsule Take 2 capsules (20 mg total) by mouth daily. 60 capsule 1   Magnesium Oxide 400 MG CAPS Take  1 capsule (400 mg total) by mouth daily. 7 capsule 0   ondansetron (ZOFRAN) 4 MG tablet Take one tablet every morning.  Can take one tablet later in the day on an as needed basis (Patient taking differently: Take one tablet every morning.  Can take one tablet later in the day on an as needed basis) 50 tablet 11   PAIN MANAGEMENT INTRATHECAL, IT, PUMP 1 each by Intrathecal route continuous. Intrathecal (IT) medication:   Morphine.  Can push every 4 hours     pantoprazole (PROTONIX) 40 MG tablet Take 1 tablet (40 mg total) by mouth daily. 30 tablet 3   Sodium Polystyrene Sulfonate (KAYEXALATE PO) Take 15 g by mouth.     SPS 15 GM/60ML suspension Take 15 g by mouth.     tiZANidine (ZANAFLEX) 4 MG tablet Take 4 mg by mouth 3 (three) times daily.     VELTASSA 8.4 g packet Take 1 packet by mouth daily.     No current facility-administered medications for this visit.     Allergies:  Allergies  Allergen Reactions   Ceftriaxone Hives    Other reaction(s): red streaks Other reaction(s): red streaks   Hydrocodone Swelling    Physical examination:     Blood pressure (!) 145/92, pulse 78, temperature 97.8 F (36.6 C), temperature source Oral, resp. rate 17, height 6\' 3"  (1.905 m), weight 165 lb 8 oz (75.1 kg), SpO2 99 %.      ECOG 1   General appearance: Alert, awake without any distress. Head: Atraumatic without abnormalities Oropharynx: Without any thrush or ulcers. Eyes: No scleral icterus. Lymph nodes: No lymphadenopathy noted in the cervical, supraclavicular, or axillary nodes Heart:regular rate and rhythm, without any murmurs or gallops.   Lung: Clear to auscultation without any rhonchi, wheezes or dullness to percussion. Abdomin: Soft, nontender without any shifting dullness or ascites. Musculoskeletal: No clubbing or cyanosis. Neurological: No motor or sensory deficits. Skin: No rashes or lesions.                     Lab Results: Lab Results   Component Value Date   WBC 8.7 08/27/2021   HGB 12.0 (L) 08/27/2021   HCT 40.0 08/27/2021   MCV 93.0 08/27/2021   PLT 194 08/27/2021     Chemistry      Component Value Date/Time   NA 135 08/27/2021 1152   NA 138 02/27/2021 1102   NA 142 12/24/2017 1317   K 5.0 08/27/2021 1152   K 4.3 12/24/2017 1317   CL 101 08/27/2021 1152   CL 106 05/24/2013 1436   CO2 25 08/27/2021 1152   CO2 32 (H) 12/24/2017 1317   BUN 17 08/27/2021 1152   BUN 28 (H) 02/27/2021 1102   BUN 10.3 12/24/2017 1317   CREATININE  1.32 (H) 08/27/2021 1152   CREATININE 0.9 12/24/2017 1317   GLU 160 (H) 06/29/2015 0957      Component Value Date/Time   CALCIUM 9.2 08/27/2021 1152   CALCIUM 8.8 12/24/2017 1317   ALKPHOS 116 08/27/2021 1152   ALKPHOS 95 12/24/2017 1317   AST 22 08/27/2021 1152   AST 20 12/24/2017 1317   ALT 25 08/27/2021 1152   ALT 25 12/24/2017 1317   BILITOT 0.3 08/27/2021 1152   BILITOT 0.25 12/24/2017 1317       Impression and Plan:   55 year old man with   1.  Stage IV clear-cell renal cell carcinoma with bone and pulmonary involvement diagnosed in 2009.   His disease status was updated at this time and treatment choices were reviewed.  Risks and benefits of continuing this treatment long-term were discussed.  Complications include GI toxicity and immune mediated issues were reviewed.  After discussion he is agreeable to continue.  Alternative treatment options including oral targeted therapy and immunotherapy will be reserved to a later date.  2.  Chronic pain: Related to metastatic disease to the bone is overall manageable.      3.  Prognosis: Therapy remains palliative with His disease is incurable.  He continues to enjoy a reasonable performance status and aggressive measures are warranted.  4.  Hypertension: Blood pressure is mildly elevated but overall controlled.  We will continue to monitor on future visits.   5.  Anxiety: Overall manageable with the current  antidepressant medication and Xanax for breakthrough.  6.  Autoimmune complications: I continue to educate him about potential issues including pneumonitis, colitis and thyroid disease.   7.  Follow-up: In 6 weeks for repeat follow-up.   30  minutes were spent on this encounter.  The time was dedicated to reviewing laboratory data, disease status update, treatment choices and future plan of care discussion.  Zola Button, MD 10/08/2021 7:55 AM

## 2021-10-10 ENCOUNTER — Telehealth: Payer: Self-pay

## 2021-10-10 ENCOUNTER — Encounter: Payer: Self-pay | Admitting: Oncology

## 2021-10-10 ENCOUNTER — Other Ambulatory Visit (HOSPITAL_COMMUNITY): Payer: Self-pay

## 2021-10-10 DIAGNOSIS — Z9689 Presence of other specified functional implants: Secondary | ICD-10-CM | POA: Diagnosis not present

## 2021-10-10 DIAGNOSIS — G894 Chronic pain syndrome: Secondary | ICD-10-CM | POA: Diagnosis not present

## 2021-10-10 DIAGNOSIS — G620 Drug-induced polyneuropathy: Secondary | ICD-10-CM | POA: Diagnosis not present

## 2021-10-10 NOTE — Telephone Encounter (Signed)
Oral Oncology Patient Advocate Encounter  Was successful in securing patient a $10000 grant from [employer] to provide copayment coverage for [employer].  This will keep the out of pocket expense at $0.     [employer] ID: [id number]  I have spoken with the patient.   The billing information is as follows and has been shared with [facility]: [id number] PCN: [id number] Member ID: [id number] Group ID: [id number] Dates of Eligibility: 11/11/21 through 11/10/22  Fund:  Renal Cell  [place] [facility] [place] Phone [phone] Fax [phone] 10/10/2021 9:29 AM

## 2021-10-14 ENCOUNTER — Other Ambulatory Visit (HOSPITAL_COMMUNITY): Payer: Self-pay

## 2021-10-15 ENCOUNTER — Other Ambulatory Visit (HOSPITAL_COMMUNITY): Payer: Self-pay

## 2021-10-16 ENCOUNTER — Other Ambulatory Visit (HOSPITAL_COMMUNITY): Payer: Self-pay

## 2021-10-18 ENCOUNTER — Other Ambulatory Visit (HOSPITAL_COMMUNITY): Payer: Self-pay

## 2021-10-21 ENCOUNTER — Other Ambulatory Visit (HOSPITAL_COMMUNITY): Payer: Self-pay

## 2021-10-23 IMAGING — CT CT CHEST W/ CM
2 of 13 series · 10 of 46 positions shown, 16 images · IV contrast (APPLIED)
Comparison: 06/03/2019

CLINICAL DATA: Restaging stage IV renal cell carcinoma.

EXAM:
CT CHEST WITH CONTRAST
CT ABDOMEN AND PELVIS WITH AND WITHOUT CONTRAST
TECHNIQUE: Multidetector CT imaging of the chest was performed during
intravenous contrast administration. Multidetector CT imaging of the
abdomen and pelvis was performed following the standard protocol
before and during bolus administration of intravenous contrast.
CONTRAST:  100mL OMNIPAQUE IOHEXOL 300 MG/ML  SOLN

[Series 3: coronal pre · coronal · non-contrast · 0.60mm/px · 2 of 101 slices shown, 3 images]
[im 34/101  soft-tissue]
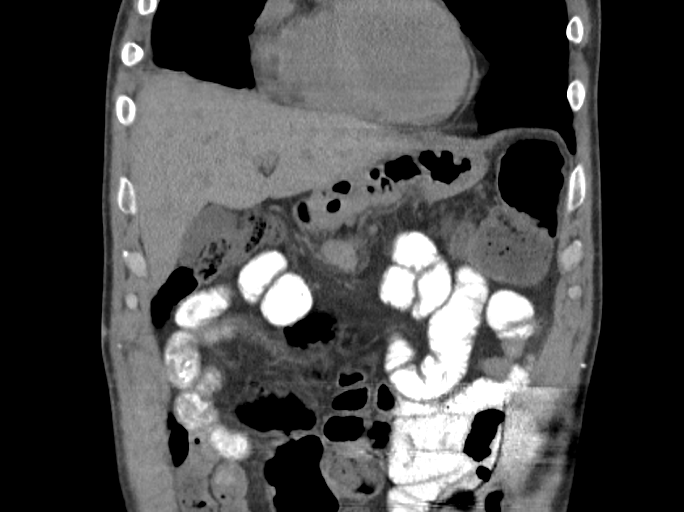
[im 34/101  bone]
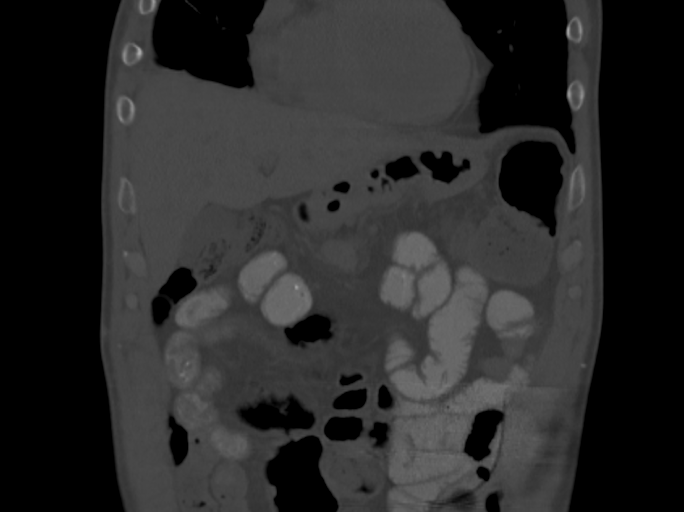
[im 67/101  soft-tissue]
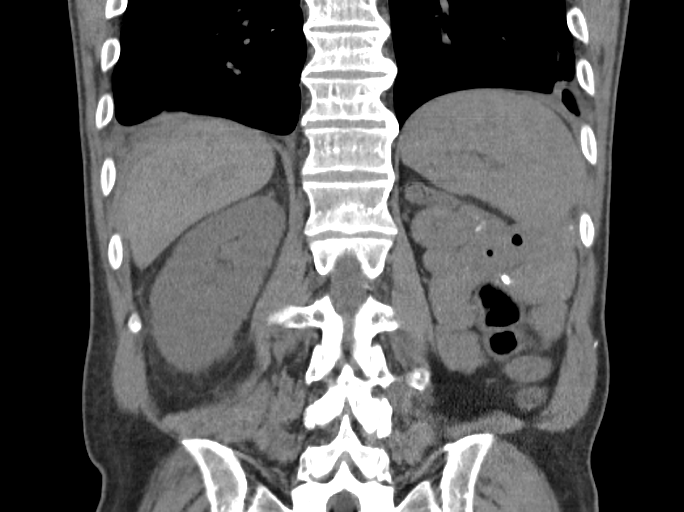

[Series 11: axial nephro · axial · 0.71mm/px · z∈[-634,-100]mm · 8 of 230 slices shown, 13 images]
[im 26/230  soft-tissue]
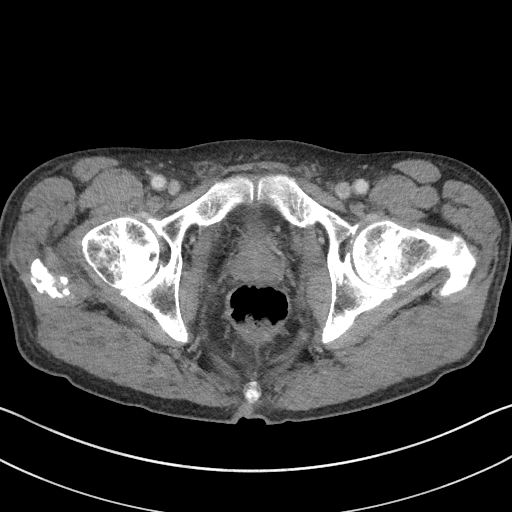
[im 26/230  bone]
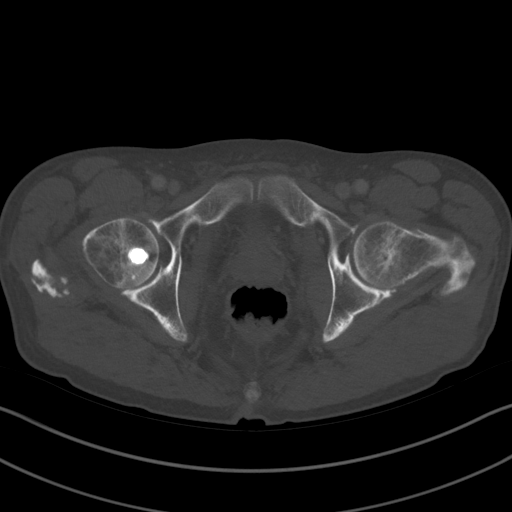
[im 51/230  soft-tissue]
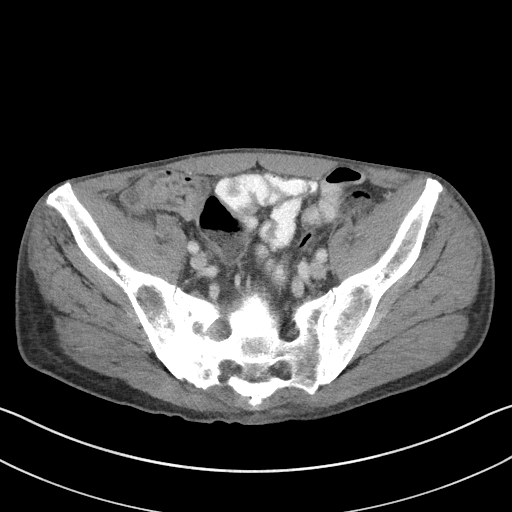
[im 77/230  soft-tissue]
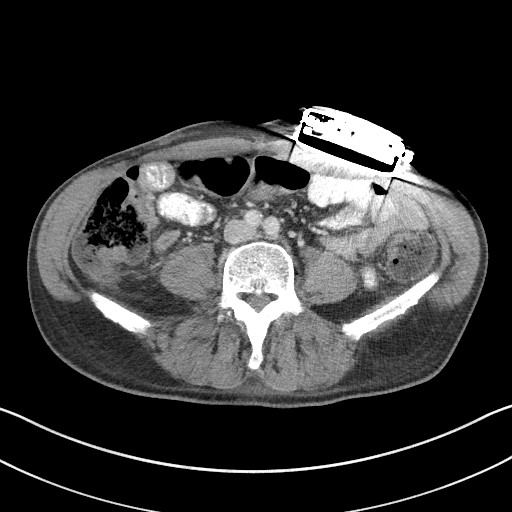
[im 102/230  soft-tissue]
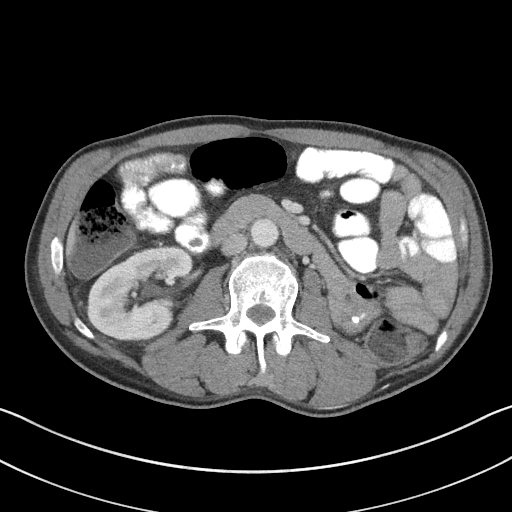
[im 128/230  soft-tissue]
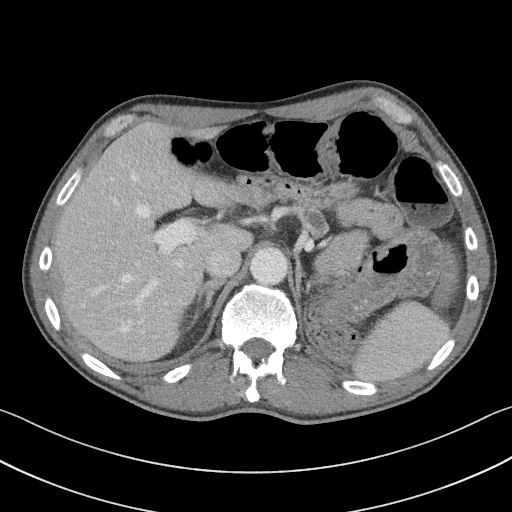
[im 128/230  lung]
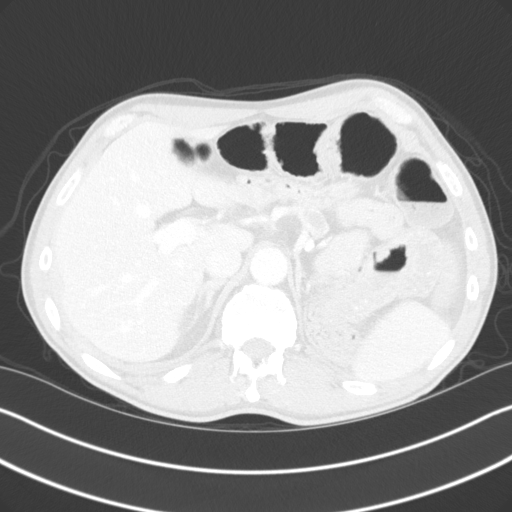
[im 153/230  soft-tissue]
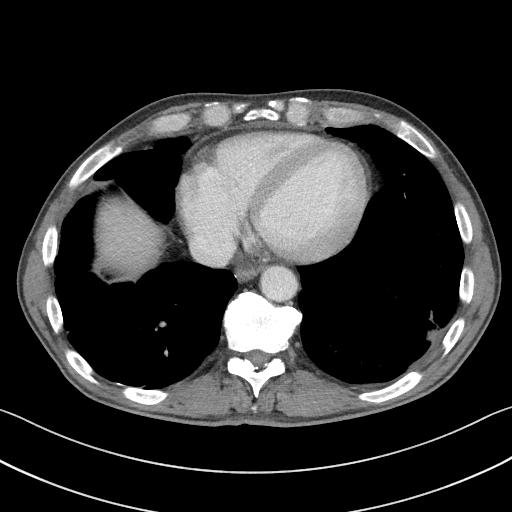
[im 153/230  lung]
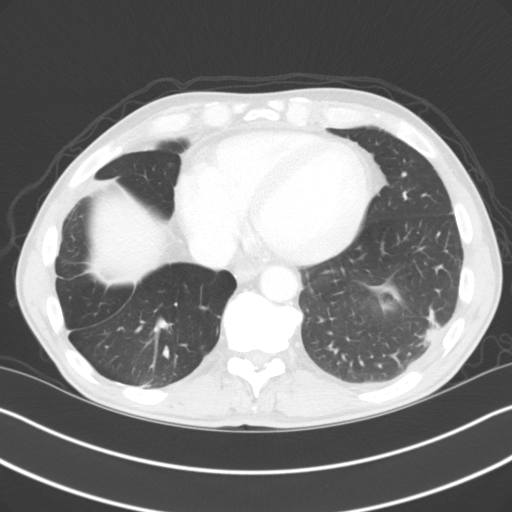
[im 179/230  soft-tissue]
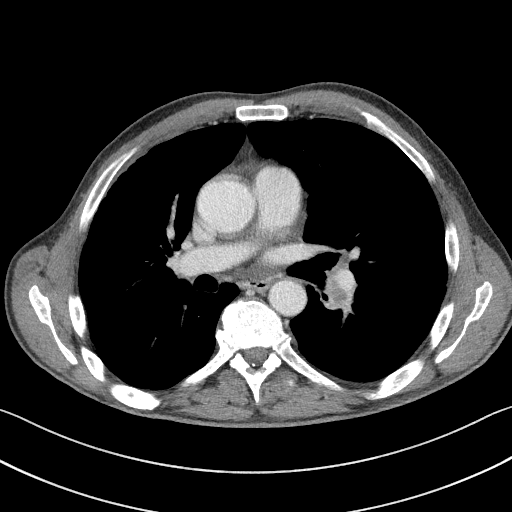
[im 179/230  lung]
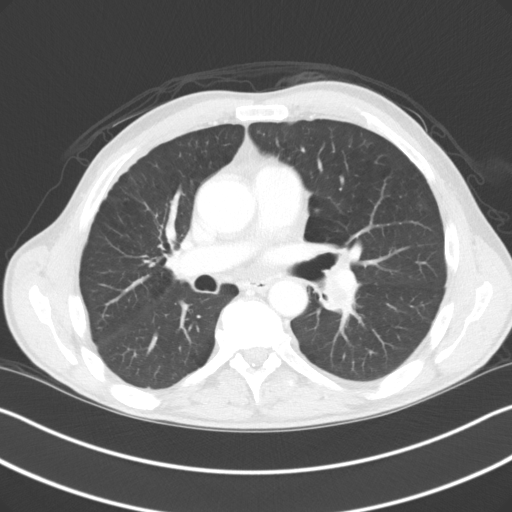
[im 204/230  soft-tissue]
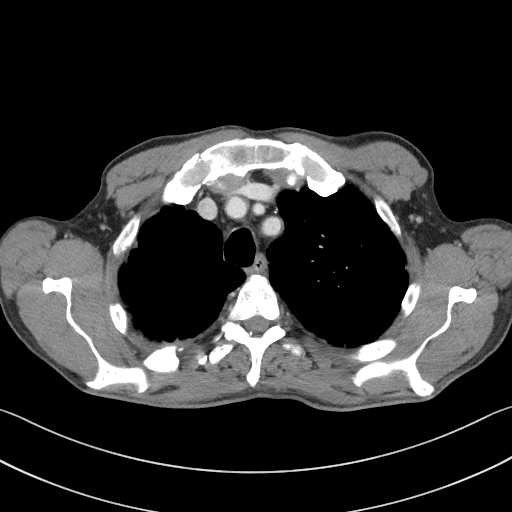
[im 204/230  lung]
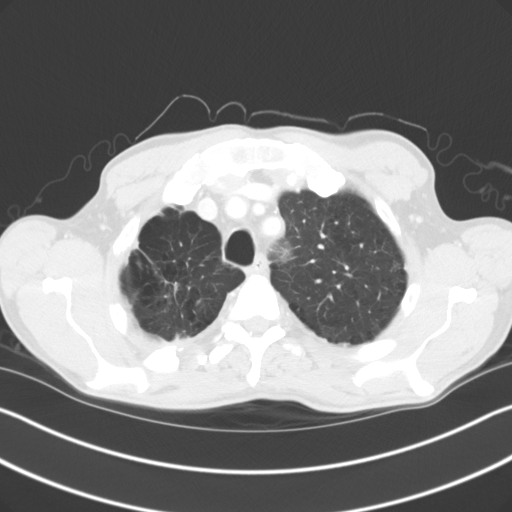

[10 of 46 positions shown; findings below may reference images not displayed]

FINDINGS: CT CHEST FINDINGS

Cardiovascular: The heart size is within normal limits. No
pericardial effusion identified.

Mediastinum/Nodes: Normal appearance of the thyroid gland. The
trachea appears patent and is midline. Normal appearance of the
esophagus.

No enlarged supraclavicular or axillary lymph nodes. No mediastinal
adenopathy. Left hilar adenopathy measures 2.6 x 2.4 cm, image
49/11. Previously 2.6 x 2.3 cm.

Lungs/Pleura: No pleural effusion. Pleuroparenchymal scarring within
the left lung base is again noted along with a small pleural
effusion. Right upper lobe volume loss with a bandlike area of
architectural distortion appears unchanged, image 46/15. Right
midlung nodular density measures 2.1 x 1.6 cm, image 63/11.
Unchanged from previous exam.

Musculoskeletal: No chest wall mass or suspicious bone lesions
identified.

CT ABDOMEN AND PELVIS FINDINGS

Hepatobiliary: No focal liver abnormality is seen. No gallstones,
gallbladder wall thickening, or biliary dilatation.

Pancreas: There are multiple masses involving the neck, body, and
proximal tail of pancreas. The largest lesion is along the neck of
pancreas measuring 2.4 cm, image 44/6. Previously 2.7 cm. There is
atrophy of the tail of pancreas with diffuse main duct dilatation.

Spleen: Normal in size without focal abnormality.

Adrenals/Urinary Tract: Normal appearance of the adrenal glands.
Left nephrectomy. Right kidney normal. Bladder unremarkable.

Stomach/Bowel: Stomach is within normal limits. Appendix appears
normal. No evidence of bowel wall thickening, distention, or
inflammatory changes.

Vascular/Lymphatic: Aortic atherosclerosis. No aneurysm. No
abdominopelvic adenopathy.

Reproductive: Prostate is unremarkable.

Other: No abdominal wall hernia or abnormality. No abdominopelvic
ascites.

Musculoskeletal: Dorsal column stimulator identified. Mild increase
in size of lytic lesion within the right sacral all a measuring
cm, image 66/12. Previously 3.1 cm. Unchanged lytic lesion involving
the right inferior pubic rami. Previous ORIF of right proximal
femur.
IMPRESSION: 1. Overall, no significant change compared with previous exam.
2. Stable left hilar adenopathy, right midlung nodule and masslike
architectural distortion in the right upper lobe. No new or
progressive disease identified within the chest.
3. No significant change in the appearance of pancreatic lesions
with associated atrophy of the tail of pancreas.
4. Stable to mild increase in size of lytic metastasis involving the
right sacral ala and right inferior pubic rami.
5. Aortic atherosclerosis.

Aortic Atherosclerosis (L8J0I-[id number].O).

## 2021-10-23 IMAGING — CT CT ABD-PEL WO/W CM
2 of 13 series · 10 of 46 positions shown, 16 images · IV contrast (APPLIED)
Comparison: 06/03/2019

CLINICAL DATA: Restaging stage IV renal cell carcinoma.

EXAM:
CT CHEST WITH CONTRAST
CT ABDOMEN AND PELVIS WITH AND WITHOUT CONTRAST
TECHNIQUE: Multidetector CT imaging of the chest was performed during
intravenous contrast administration. Multidetector CT imaging of the
abdomen and pelvis was performed following the standard protocol
before and during bolus administration of intravenous contrast.
CONTRAST:  100mL OMNIPAQUE IOHEXOL 300 MG/ML  SOLN

[Series 3: coronal pre · coronal · non-contrast · 0.60mm/px · 2 of 101 slices shown, 3 images]
[im 34/101  soft-tissue]
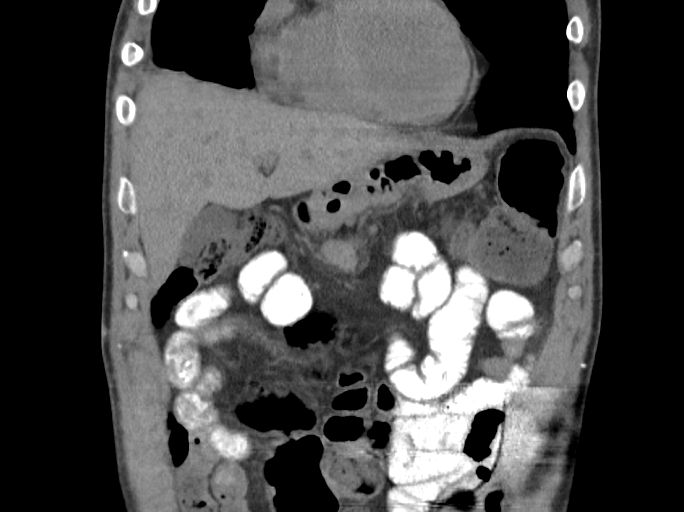
[im 34/101  bone]
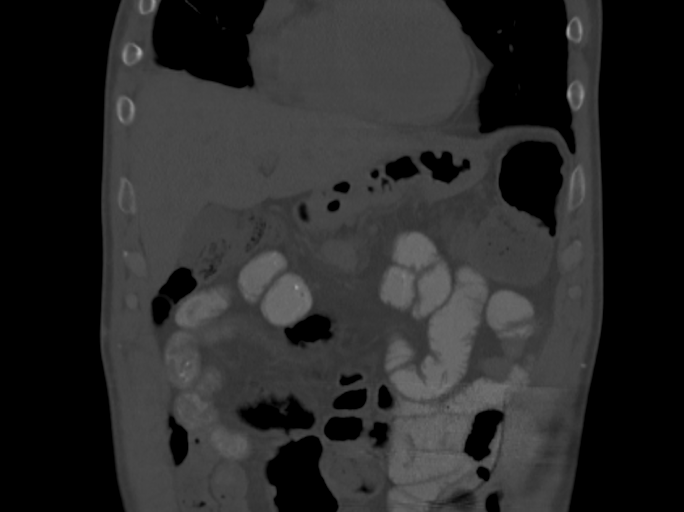
[im 67/101  soft-tissue]
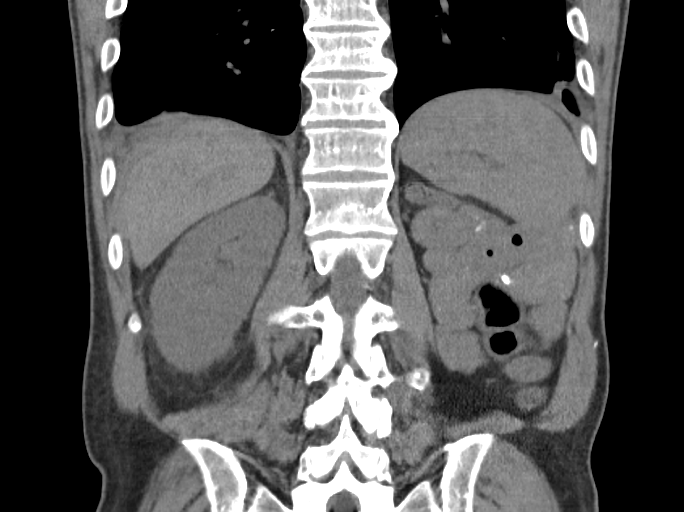

[Series 11: axial nephro · axial · 0.71mm/px · z∈[-634,-100]mm · 8 of 230 slices shown, 13 images]
[im 26/230  soft-tissue]
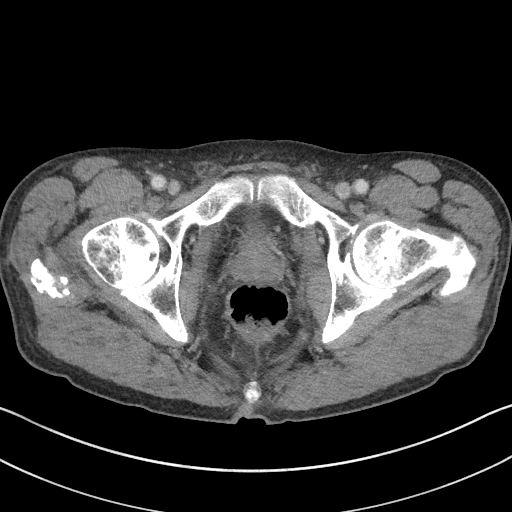
[im 26/230  bone]
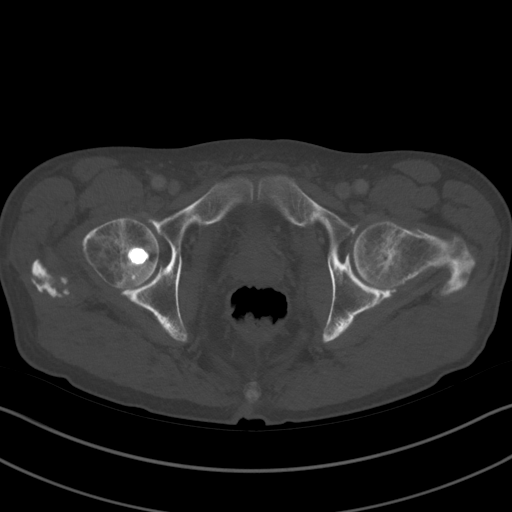
[im 51/230  soft-tissue]
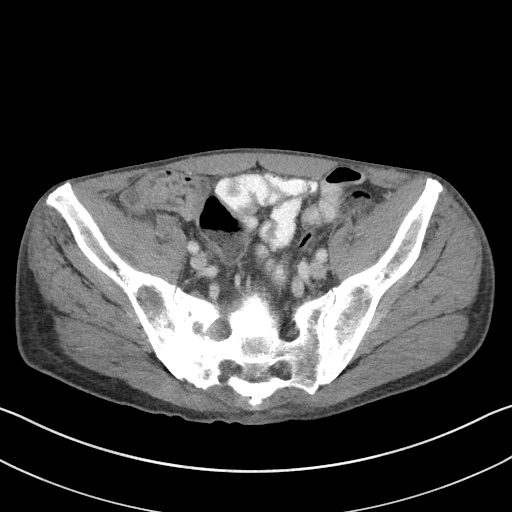
[im 77/230  soft-tissue]
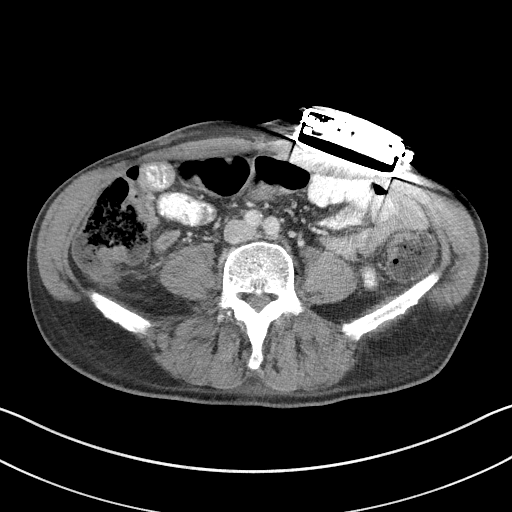
[im 102/230  soft-tissue]
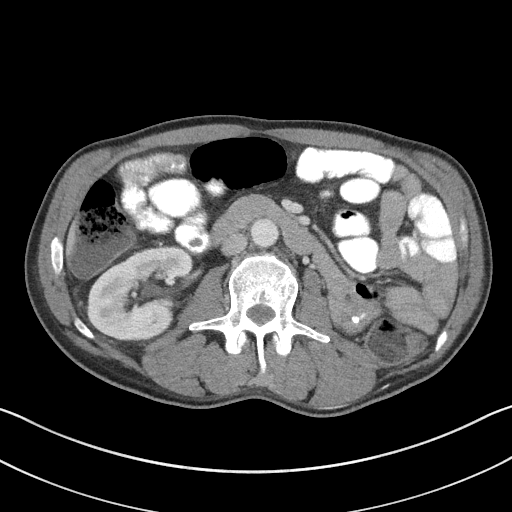
[im 128/230  soft-tissue]
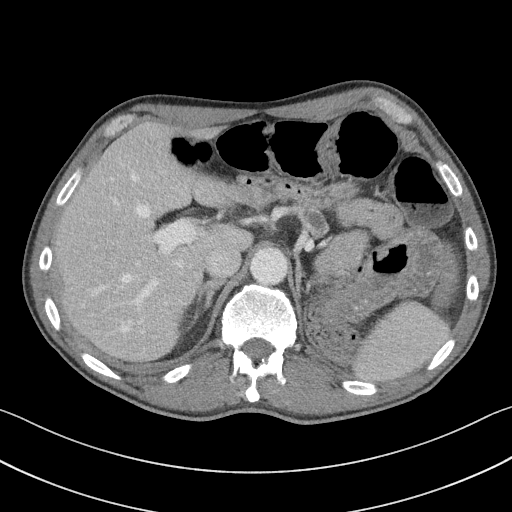
[im 128/230  lung]
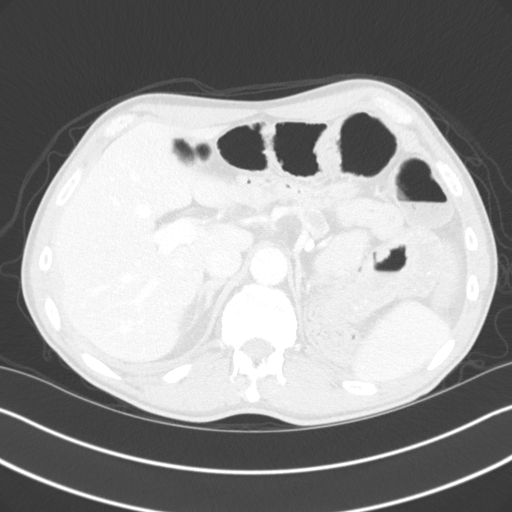
[im 153/230  soft-tissue]
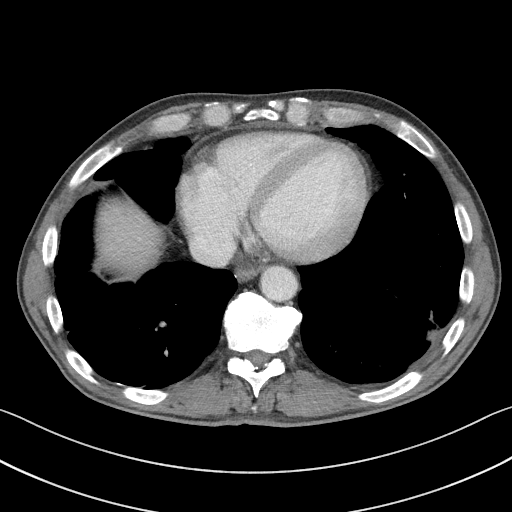
[im 153/230  lung]
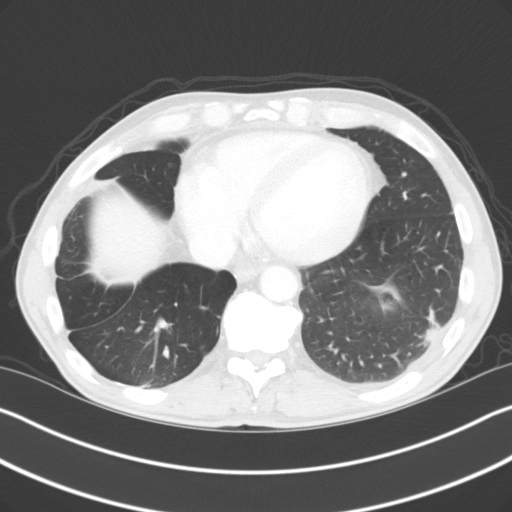
[im 179/230  soft-tissue]
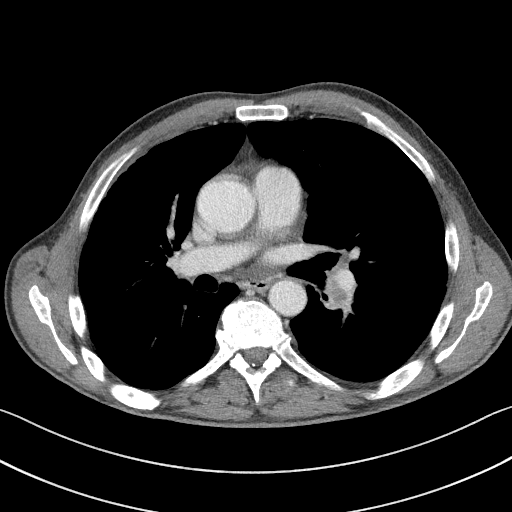
[im 179/230  lung]
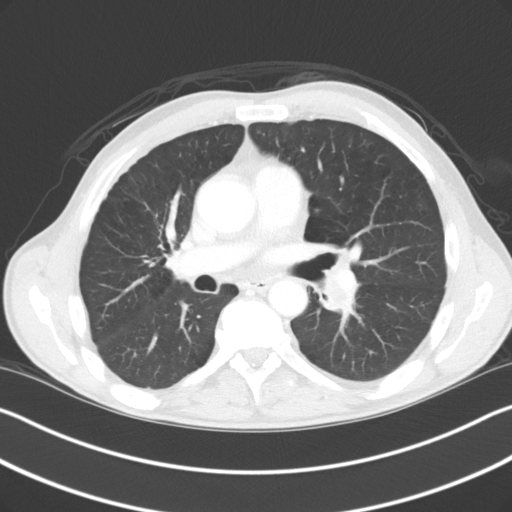
[im 204/230  soft-tissue]
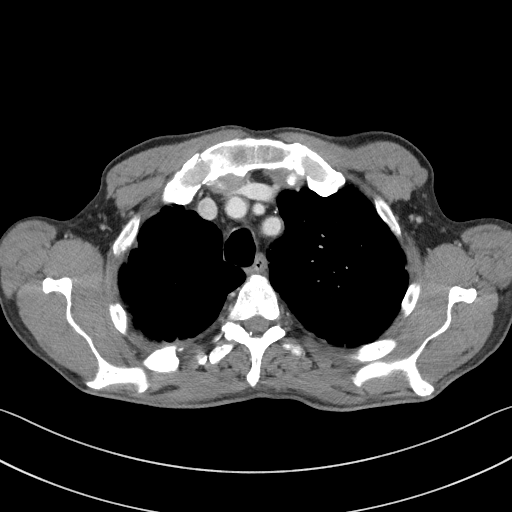
[im 204/230  lung]
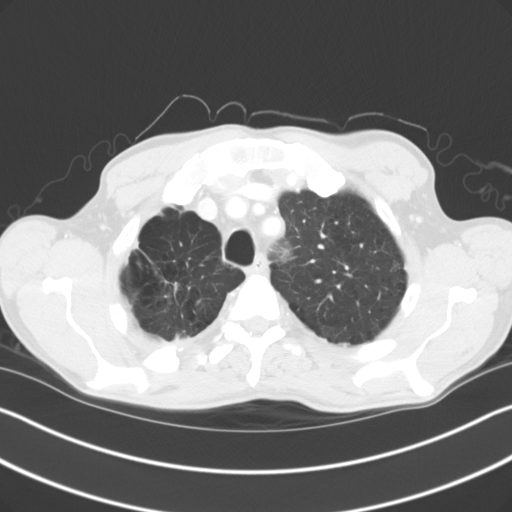

[10 of 46 positions shown; findings below may reference images not displayed]

FINDINGS: CT CHEST FINDINGS

Cardiovascular: The heart size is within normal limits. No
pericardial effusion identified.

Mediastinum/Nodes: Normal appearance of the thyroid gland. The
trachea appears patent and is midline. Normal appearance of the
esophagus.

No enlarged supraclavicular or axillary lymph nodes. No mediastinal
adenopathy. Left hilar adenopathy measures 2.6 x 2.4 cm, image
49/11. Previously 2.6 x 2.3 cm.

Lungs/Pleura: No pleural effusion. Pleuroparenchymal scarring within
the left lung base is again noted along with a small pleural
effusion. Right upper lobe volume loss with a bandlike area of
architectural distortion appears unchanged, image 46/15. Right
midlung nodular density measures 2.1 x 1.6 cm, image 63/11.
Unchanged from previous exam.

Musculoskeletal: No chest wall mass or suspicious bone lesions
identified.

CT ABDOMEN AND PELVIS FINDINGS

Hepatobiliary: No focal liver abnormality is seen. No gallstones,
gallbladder wall thickening, or biliary dilatation.

Pancreas: There are multiple masses involving the neck, body, and
proximal tail of pancreas. The largest lesion is along the neck of
pancreas measuring 2.4 cm, image 44/6. Previously 2.7 cm. There is
atrophy of the tail of pancreas with diffuse main duct dilatation.

Spleen: Normal in size without focal abnormality.

Adrenals/Urinary Tract: Normal appearance of the adrenal glands.
Left nephrectomy. Right kidney normal. Bladder unremarkable.

Stomach/Bowel: Stomach is within normal limits. Appendix appears
normal. No evidence of bowel wall thickening, distention, or
inflammatory changes.

Vascular/Lymphatic: Aortic atherosclerosis. No aneurysm. No
abdominopelvic adenopathy.

Reproductive: Prostate is unremarkable.

Other: No abdominal wall hernia or abnormality. No abdominopelvic
ascites.

Musculoskeletal: Dorsal column stimulator identified. Mild increase
in size of lytic lesion within the right sacral all a measuring
cm, image 66/12. Previously 3.1 cm. Unchanged lytic lesion involving
the right inferior pubic rami. Previous ORIF of right proximal
femur.
IMPRESSION: 1. Overall, no significant change compared with previous exam.
2. Stable left hilar adenopathy, right midlung nodule and masslike
architectural distortion in the right upper lobe. No new or
progressive disease identified within the chest.
3. No significant change in the appearance of pancreatic lesions
with associated atrophy of the tail of pancreas.
4. Stable to mild increase in size of lytic metastasis involving the
right sacral ala and right inferior pubic rami.
5. Aortic atherosclerosis.

Aortic Atherosclerosis (L8J0I-[id number].O).

## 2021-10-29 ENCOUNTER — Other Ambulatory Visit: Payer: Self-pay | Admitting: Oncology

## 2021-10-29 DIAGNOSIS — C649 Malignant neoplasm of unspecified kidney, except renal pelvis: Secondary | ICD-10-CM

## 2021-11-03 ENCOUNTER — Other Ambulatory Visit: Payer: Self-pay | Admitting: Cardiovascular Disease

## 2021-11-04 DIAGNOSIS — G894 Chronic pain syndrome: Secondary | ICD-10-CM | POA: Diagnosis not present

## 2021-11-04 DIAGNOSIS — Z9689 Presence of other specified functional implants: Secondary | ICD-10-CM | POA: Diagnosis not present

## 2021-11-04 DIAGNOSIS — R030 Elevated blood-pressure reading, without diagnosis of hypertension: Secondary | ICD-10-CM | POA: Diagnosis not present

## 2021-11-05 ENCOUNTER — Telehealth: Payer: Self-pay | Admitting: *Deleted

## 2021-11-05 NOTE — Telephone Encounter (Signed)
   [facility] Pre-operative Risk Assessment    Patient Name: Keith [name]  DOB: [birth year] MRN: [id number]  HEARTCARE STAFF:  - IMPORTANT!!!!!! Under Visit Info/Reason for Call, type in Other and utilize the format Clearance MM/DD/YY or Clearance TBD. Do not use dashes or single digits. - Please review there is not already an duplicate clearance open for this procedure. - If request is for dental extraction, please clarify the # of teeth to be extracted. - If the patient is currently at the dentist's office, call Pre-Op Callback Staff (MA/nurse) to input urgent request.  - If the patient is not currently in the dentist office, please route to the Pre-Op pool.  Request for surgical clearance:  What type of surgery is being performed?  LEFT PAIN PUMP BATTERY REPLACEMENT   When is this surgery scheduled?  TBD  What type of clearance is required (medical clearance vs. Pharmacy clearance to hold med vs. Both)?  MEDICAL  Are there any medications that need to be held prior to surgery and how long?  N/A  Practice name and name of physician performing surgery?  [facility] / DR. DAWLEY  What is the office phone number?  [phone]   7.   What is the office fax number?  [phone]  ATTN:  JESSICA   8.   Anesthesia type (None, local, MAC, general) ?  GENERAL    Jeanann Lewandowsky 11/05/2021, 9:58 AM  _________________________________________________________________   (provider comments below)

## 2021-11-06 NOTE — Telephone Encounter (Signed)
   Primary Cardiologist: Evalina Field, MD  Chart reviewed as part of pre-operative protocol coverage. Given past medical history and time since last visit, based on ACC/AHA guidelines, Keith [name] would be at acceptable risk for the planned procedure without further cardiovascular testing.   Patient was advised that if he develops new symptoms prior to surgery to contact our office to arrange a follow-up appointment.  He verbalized understanding.  I will route this recommendation to the requesting party via Epic fax function and remove from pre-op pool.  Please call with questions.  Jossie Ng. Percilla Tweten NP-C    11/06/2021, 9:19 AM [place] [address] Office [phone] Fax [phone]

## 2021-11-07 ENCOUNTER — Other Ambulatory Visit: Payer: Self-pay | Admitting: Neurological Surgery

## 2021-11-11 ENCOUNTER — Other Ambulatory Visit (HOSPITAL_COMMUNITY): Payer: Self-pay

## 2021-11-11 ENCOUNTER — Other Ambulatory Visit: Payer: Self-pay | Admitting: Oncology

## 2021-11-11 MED ORDER — LENVIMA (20 MG DAILY DOSE) 2 X 10 MG PO CPPK
20.0000 mg | ORAL_CAPSULE | Freq: Every day | ORAL | 1 refills | Status: DC
  Filled 2021-11-11: qty 60, 30d supply, fill #0
  Filled 2021-12-10: qty 60, 30d supply, fill #1

## 2021-11-12 ENCOUNTER — Telehealth: Payer: Self-pay | Admitting: Oncology

## 2021-11-12 NOTE — Telephone Encounter (Signed)
Sch per 10/18 los, left msg

## 2021-11-13 ENCOUNTER — Other Ambulatory Visit (HOSPITAL_COMMUNITY): Payer: Self-pay

## 2021-11-26 ENCOUNTER — Inpatient Hospital Stay: Payer: Medicare Other

## 2021-11-26 ENCOUNTER — Inpatient Hospital Stay (HOSPITAL_BASED_OUTPATIENT_CLINIC_OR_DEPARTMENT_OTHER): Payer: Medicare Other | Admitting: Oncology

## 2021-11-26 ENCOUNTER — Other Ambulatory Visit: Payer: Self-pay

## 2021-11-26 ENCOUNTER — Inpatient Hospital Stay: Payer: Medicare Other | Attending: Oncology

## 2021-11-26 VITALS — BP 151/99 | HR 75 | Temp 97.9°F | Resp 17 | Ht 75.0 in | Wt 167.1 lb

## 2021-11-26 DIAGNOSIS — Z5112 Encounter for antineoplastic immunotherapy: Secondary | ICD-10-CM | POA: Diagnosis not present

## 2021-11-26 DIAGNOSIS — C649 Malignant neoplasm of unspecified kidney, except renal pelvis: Secondary | ICD-10-CM | POA: Diagnosis not present

## 2021-11-26 DIAGNOSIS — C642 Malignant neoplasm of left kidney, except renal pelvis: Secondary | ICD-10-CM | POA: Insufficient documentation

## 2021-11-26 DIAGNOSIS — C7951 Secondary malignant neoplasm of bone: Secondary | ICD-10-CM | POA: Diagnosis not present

## 2021-11-26 DIAGNOSIS — Z79899 Other long term (current) drug therapy: Secondary | ICD-10-CM | POA: Insufficient documentation

## 2021-11-26 LAB — CMP (CANCER CENTER ONLY)
ALT: 26 U/L (ref 0–44)
AST: 25 U/L (ref 15–41)
Albumin: 3 g/dL — ABNORMAL LOW (ref 3.5–5.0)
Alkaline Phosphatase: 110 U/L (ref 38–126)
Anion gap: 10 (ref 5–15)
BUN: 16 mg/dL (ref 6–20)
CO2: 23 mmol/L (ref 22–32)
Calcium: 9.1 mg/dL (ref 8.9–10.3)
Chloride: 102 mmol/L (ref 98–111)
Creatinine: 1.31 mg/dL — ABNORMAL HIGH (ref 0.61–1.24)
GFR, Estimated: >60 mL/min (ref >60)
Glucose, Bld: 135 mg/dL — ABNORMAL HIGH (ref 70–99)
Potassium: 4.4 mmol/L (ref 3.5–5.1)
Sodium: 135 mmol/L (ref 135–145)
Total Bilirubin: 0.3 mg/dL (ref 0.3–1.2)
Total Protein: 8.1 g/dL (ref 6.5–8.1)

## 2021-11-26 LAB — CBC WITH DIFFERENTIAL (CANCER CENTER ONLY)
Abs Immature Granulocytes: 0.01 10*3/uL (ref 0.00–0.07)
Basophils Absolute: 0.1 10*3/uL (ref 0.0–0.1)
Basophils Relative: 1 %
Eosinophils Absolute: 0.4 10*3/uL (ref 0.0–0.5)
Eosinophils Relative: 6 %
HCT: 41.4 % (ref 39.0–52.0)
Hemoglobin: 13 g/dL (ref 13.0–17.0)
Immature Granulocytes: 0 %
Lymphocytes Relative: 20 %
Lymphs Abs: 1.2 10*3/uL (ref 0.7–4.0)
MCH: 29.9 pg (ref 26.0–34.0)
MCHC: 31.4 g/dL (ref 30.0–36.0)
MCV: 95.2 fL (ref 80.0–100.0)
Monocytes Absolute: 0.5 10*3/uL (ref 0.1–1.0)
Monocytes Relative: 8 %
Neutro Abs: 4 10*3/uL (ref 1.7–7.7)
Neutrophils Relative %: 65 %
Platelet Count: 153 10*3/uL (ref 150–400)
RBC: 4.35 MIL/uL (ref 4.22–5.81)
RDW: 16.6 % — ABNORMAL HIGH (ref 11.5–15.5)
WBC Count: 6.1 10*3/uL (ref 4.0–10.5)
nRBC: 0 % (ref 0.0–0.2)

## 2021-11-26 LAB — TSH: TSH: 4.454 u[IU]/mL — ABNORMAL HIGH (ref 0.320–4.118)

## 2021-11-26 MED ORDER — SODIUM CHLORIDE 0.9 % IV SOLN: Freq: Once | INTRAVENOUS | Status: DC

## 2021-11-26 MED ORDER — SODIUM CHLORIDE 0.9 % IV SOLN
400.0000 mg | Freq: Once | INTRAVENOUS | Status: AC
  Administered 2021-11-26: 400 mg via INTRAVENOUS
  Filled 2021-11-26: qty 16

## 2021-11-26 NOTE — Progress Notes (Signed)
Hematology and Oncology Follow Up   Keith [name] [id number] 1966/04/25 55 y.o. 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  Prior Therapy: Status post laparoscopic radical nephrectomy.  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  Patient status post thoracotomy for a synchronous metastatic lung lesions done October 2009.   Patient is status post stereotactic radiotherapy to pulmonary nodules in May of 2010. He is S/P Sutent 50 mg 4 weeks on 2 weeks off from 10/2010 to 03/2013. He progressed at that time.  He is S/P radiation to the right sacral bone between 4/22 to 4/30.  He is S/P XRT to the left shoulder 03/20/14 to 03/31/14. Votrient 800 mg by mouth daily from 03/2013 through 06/22/2015. Discontinued secondary to disease progression. Nivolumab 3 mg/kg given every 2 weeks started on 06/29/2015. He is status post 4 cycles completed 08/10/2015. He developed disease progression in September 2016.  Status post radiation therapy to the left mid fibula completed on 11/14/2015. He received a grade 1 fraction. He is status post radiation to the proximal and distal femur over 2 weeks and 10 fractions of total of 30 Gy.  This was completed in March 2019. 11.   Radiation therapy to the right tibia.  Last treatment completed in September 2019. 12. Cabometyx 60 mg daily started in November 2016.  Therapy discontinued and February 2022 due to progression of disease. 13.  Axitinib 5 mg twice a day started in March 2022.  Therapy discontinued in June 2022 due to progression of disease.   Current therapy: Pembrolizumab 200 mg every 3 weeks with lenvatinib 20 mg daily started on May 26, 2021.  He received 400 mg after cycle 5 and will be repeated every 6 weeks.  He is here for cycle 7 of therapy.  Interim History: Mr.  Keith [name] presents today for repeat evaluation.  Since the last visit, he reports no major changes in his health.  He continues to tolerate treatment without any issues.  He denies any nausea, vomiting or abdominal pain.  He does report some mild skin irritation around the bridge of his nose but no diffuse rash.  His performance status quality of life remained excellent.  His pain is manageable.   Medications: Updated on review. Current Outpatient Medications  Medication Sig Dispense Refill   acetaminophen (TYLENOL) 500 MG tablet Take 500 mg by mouth every 6 (six) hours as needed for moderate pain or mild pain.     ALPRAZolam (XANAX) 1 MG tablet TAKE 1 TABLET BY MOUTH EVERY 8 HOURS AS NEEDED 90 tablet 0   carvedilol (COREG) 12.5 MG tablet TAKE 1 TABLET (12.5 MG TOTAL) BY MOUTH 2 (TWO) TIMES DAILY WITH A MEAL. 180 tablet 3   Cyanocobalamin (B-12) 1000 MCG TABS Take 1 tablet (1,000 mcg total) by mouth daily. 30 tablet    diphenhydrAMINE (BENADRYL) 25 MG tablet Take 25 mg by mouth daily.     diphenhydrAMINE (SOMINEX) 25 MG tablet Take 25 mg by mouth.     DULoxetine (CYMBALTA) 60 MG capsule Take 60 mg by mouth.     ferrous sulfate 325 (65 FE) MG tablet TAKE 1 TABLET BY MOUTH EVERY DAY WITH BREAKFAST 90 tablet 2   furosemide (LASIX) 20 MG tablet Take 1 tablet (20 mg total) by mouth every other day. 60 tablet 2  gabapentin (NEURONTIN) 300 MG capsule TAKE 1 CAPSULE BY MOUTH THREE TIMES A DAY 270 capsule 3   Glycerin-Hypromellose-PEG 400 ([employer] DRY EYE RELIEF) 0.2-0.2-1 % SOLN Place 1 drop into both eyes daily as needed (Dry eye).     hydrALAZINE (APRESOLINE) 25 MG tablet Take 1 tablet (25 mg total) by mouth 3 (three) times daily. 270 tablet 3   HYDROmorphone (DILAUDID) 8 MG tablet Take 8 mg by mouth 3 (three) times daily.     lenvatinib 20 mg daily dose (LENVIMA, 20 MG DAILY DOSE,) 2 x 10 MG capsule Take 2 capsules (20 mg total) by mouth daily. 60 capsule 1   Magnesium Oxide 400 MG CAPS Take 1 capsule (400  mg total) by mouth daily. 7 capsule 0   ondansetron (ZOFRAN) 4 MG tablet Take one tablet every morning.  Can take one tablet later in the day on an as needed basis (Patient taking differently: Take one tablet every morning.  Can take one tablet later in the day on an as needed basis) 50 tablet 11   PAIN MANAGEMENT INTRATHECAL, IT, PUMP 1 each by Intrathecal route continuous. Intrathecal (IT) medication:   Morphine.  Can push every 4 hours     pantoprazole (PROTONIX) 40 MG tablet Take 1 tablet (40 mg total) by mouth daily. 30 tablet 3   Sodium Polystyrene Sulfonate (KAYEXALATE PO) Take 15 g by mouth.     SPS 15 GM/60ML suspension Take 15 g by mouth.     tiZANidine (ZANAFLEX) 4 MG tablet Take 4 mg by mouth 3 (three) times daily.     VELTASSA 8.4 g packet Take 1 packet by mouth daily.     No current facility-administered medications for this visit.     Allergies:  Allergies  Allergen Reactions   Ceftriaxone Hives    Other reaction(s): red streaks Other reaction(s): red streaks   Hydrocodone Swelling    Physical examination:     Blood pressure (!) 151/99, pulse 75, temperature 97.9 F (36.6 C), temperature source Tympanic, resp. rate 17, height 6\' 3"  (1.905 m), weight 167 lb 1.6 oz (75.8 kg), SpO2 99 %.       ECOG 1    General appearance: Comfortable appearing without any discomfort Head: Normocephalic without any trauma Oropharynx: Mucous membranes are moist and pink without any thrush or ulcers. Eyes: Pupils are equal and round reactive to light. Lymph nodes: No cervical, supraclavicular, inguinal or axillary lymphadenopathy.   Heart:regular rate and rhythm.  S1 and S2 without leg edema. Lung: Clear without any rhonchi or wheezes.  No dullness to percussion. Abdomin: Soft, nontender, nondistended with good bowel sounds.  No hepatosplenomegaly. Musculoskeletal: No joint deformity or effusion.  Full range of motion noted. Neurological: No deficits noted on motor, sensory  and deep tendon reflex exam. Skin: No petechial rash or dryness.  Appeared moist.                      Lab Results: Lab Results  Component Value Date   WBC 6.0 10/08/2021   HGB 12.5 (L) 10/08/2021   HCT 39.3 10/08/2021   MCV 91.6 10/08/2021   PLT 144 (L) 10/08/2021     Chemistry      Component Value Date/Time   NA 136 10/08/2021 0742   NA 138 02/27/2021 1102   NA 142 12/24/2017 1317   K 3.9 10/08/2021 0742   K 4.3 12/24/2017 1317   CL 100 10/08/2021 0742   CL 106 05/24/2013 1436  CO2 26 10/08/2021 0742   CO2 32 (H) 12/24/2017 1317   BUN 11 10/08/2021 0742   BUN 28 (H) 02/27/2021 1102   BUN 10.3 12/24/2017 1317   CREATININE 1.22 10/08/2021 0742   CREATININE 0.9 12/24/2017 1317   GLU 160 (H) 06/29/2015 0957      Component Value Date/Time   CALCIUM 8.9 10/08/2021 0742   CALCIUM 8.8 12/24/2017 1317   ALKPHOS 102 10/08/2021 0742   ALKPHOS 95 12/24/2017 1317   AST 19 10/08/2021 0742   AST 20 12/24/2017 1317   ALT 19 10/08/2021 0742   ALT 25 12/24/2017 1317   BILITOT 0.2 (L) 10/08/2021 0742   BILITOT 0.25 12/24/2017 1317       Impression and Plan:   55 year old man with   1.  Kidney cancer diagnosed in 2009.  He developed stage IV clear-cell with bone and pulmonary involvement.    He continues to tolerate current therapy without any major issues.  Complications that include immune mediated issues as well as GI toxicity were discussed.  Plan is to update his staging scan before the next visit.  Alternative treatment options were also reviewed.  He is agreeable to proceed at this time.  2.  Chronic pain: Continues to be manageable at this time without any major changes.      3.  Prognosis: His disease is incurable although aggressive measures are warranted given his reasonable performance status.   4.  Hypertension: His blood pressure continues to be mildly elevated and blood pressure medication may be need to be adjusted.   5.  Anxiety:  Currently manageable with current medication.  6.  Autoimmune complications: He has not experienced any issues at this time.  Including pneumonitis, colitis and thyroid disease.  These were all reiterated.   7.  Follow-up: I he will return in 6 weeks for a follow-up visit.   30  minutes were dedicated to this visit.  The time spent on reviewing disease status, treatment choices and addressing complication related to cancer and cancer therapy.  Zola Button, MD 11/26/2021 7:48 AM

## 2021-11-27 ENCOUNTER — Other Ambulatory Visit: Payer: Self-pay | Admitting: Oncology

## 2021-11-27 DIAGNOSIS — C649 Malignant neoplasm of unspecified kidney, except renal pelvis: Secondary | ICD-10-CM

## 2021-12-02 ENCOUNTER — Encounter: Payer: Self-pay | Admitting: Oncology

## 2021-12-02 ENCOUNTER — Telehealth: Payer: Self-pay | Admitting: Pharmacy Technician

## 2021-12-02 ENCOUNTER — Other Ambulatory Visit (HOSPITAL_COMMUNITY): Payer: Self-pay

## 2021-12-02 NOTE — Telephone Encounter (Signed)
Received notification from [facility] regarding a prior authorization for  Lenvima 20mg  . Authorization has been APPROVED from 12/22/21 to 12/21/22.   Authorization # [id number]

## 2021-12-02 NOTE — Telephone Encounter (Signed)
Submitted a Prior Authorization request to [facility] for  Lenvima  via CoverMyMeds. Will update once we receive a response.   Key: [id number] - [place] Case ID: [id number]

## 2021-12-08 ENCOUNTER — Other Ambulatory Visit: Payer: Self-pay

## 2021-12-10 ENCOUNTER — Other Ambulatory Visit (HOSPITAL_COMMUNITY): Payer: Self-pay

## 2021-12-11 ENCOUNTER — Other Ambulatory Visit: Payer: Self-pay

## 2021-12-11 ENCOUNTER — Encounter (HOSPITAL_COMMUNITY): Payer: Self-pay

## 2021-12-11 ENCOUNTER — Ambulatory Visit (HOSPITAL_COMMUNITY): Payer: Medicare Other

## 2021-12-11 ENCOUNTER — Ambulatory Visit (HOSPITAL_COMMUNITY)
Admission: RE | Admit: 2021-12-11 | Discharge: 2021-12-11 | Disposition: A | Discharge status: Home / Self Care | Payer: Medicare Other | Source: Ambulatory Visit | Attending: Oncology | Admitting: Oncology

## 2021-12-11 DIAGNOSIS — R911 Solitary pulmonary nodule: Secondary | ICD-10-CM | POA: Insufficient documentation

## 2021-12-11 DIAGNOSIS — J439 Emphysema, unspecified: Secondary | ICD-10-CM | POA: Diagnosis not present

## 2021-12-11 DIAGNOSIS — J90 Pleural effusion, not elsewhere classified: Secondary | ICD-10-CM | POA: Diagnosis not present

## 2021-12-11 DIAGNOSIS — K769 Liver disease, unspecified: Secondary | ICD-10-CM | POA: Diagnosis not present

## 2021-12-11 DIAGNOSIS — C649 Malignant neoplasm of unspecified kidney, except renal pelvis: Secondary | ICD-10-CM | POA: Insufficient documentation

## 2021-12-11 DIAGNOSIS — I700 Atherosclerosis of aorta: Secondary | ICD-10-CM | POA: Insufficient documentation

## 2021-12-11 DIAGNOSIS — C7951 Secondary malignant neoplasm of bone: Secondary | ICD-10-CM | POA: Insufficient documentation

## 2021-12-11 DIAGNOSIS — R590 Localized enlarged lymph nodes: Secondary | ICD-10-CM | POA: Insufficient documentation

## 2021-12-11 MED ORDER — IOHEXOL 350 MG/ML SOLN
75.0000 mL | Freq: Once | INTRAVENOUS | Status: AC | PRN
  Administered 2021-12-11: 08:00:00 75 mL via INTRAVENOUS

## 2021-12-11 MED ORDER — SODIUM CHLORIDE (PF) 0.9 % IJ SOLN
INTRAMUSCULAR | Status: AC
  Filled 2021-12-11: qty 50

## 2021-12-12 ENCOUNTER — Other Ambulatory Visit (HOSPITAL_COMMUNITY): Payer: Self-pay

## 2021-12-17 ENCOUNTER — Other Ambulatory Visit: Payer: Self-pay | Admitting: Gastroenterology

## 2021-12-19 ENCOUNTER — Encounter: Payer: Self-pay | Admitting: Cardiovascular Disease

## 2021-12-19 ENCOUNTER — Ambulatory Visit (INDEPENDENT_AMBULATORY_CARE_PROVIDER_SITE_OTHER): Payer: Medicare Other | Admitting: Cardiovascular Disease

## 2021-12-19 ENCOUNTER — Other Ambulatory Visit: Payer: Self-pay

## 2021-12-19 VITALS — BP 128/98 | HR 75 | Ht 75.0 in | Wt 167.2 lb

## 2021-12-19 DIAGNOSIS — I10 Essential (primary) hypertension: Secondary | ICD-10-CM | POA: Diagnosis not present

## 2021-12-19 DIAGNOSIS — R601 Generalized edema: Secondary | ICD-10-CM | POA: Diagnosis not present

## 2021-12-19 DIAGNOSIS — I5022 Chronic systolic (congestive) heart failure: Secondary | ICD-10-CM

## 2021-12-19 NOTE — Progress Notes (Signed)
Cardiology Office Note:   Date:  12/19/2021  NAME:  Keith [name]    MRN: [id number] DOB:  [birth year]   PCP:  [name], MD  Cardiologist:  Evalina Field, MD  Electrophysiologist:  None   Referring MD: [name], MD   Chief Complaint  Patient presents with   Follow-up   History of Present Illness:   Keith [name] is a 55 y.o. male with a hx of Stage IV renal cancer on palliative chemotherapy, systolic HF, recurrent GI bleed, PE (not candidate for [facility] due to recurrent GI bleed/anemia, CKD III who presents for follow-up.  He reports he is doing well.  Chemotherapy continues to go well.  His weight is well controlled.  He is taking Lasix every other day.  No more lightheadedness or dizziness.  Overall seems to be doing quite well.  Blood pressure well controlled.  No evidence of congestive heart failure today on exam.  His kidney function has improved.  He continues to have ongoing need for chemotherapy.  Overall has done well.  Suspect a less aggressive approach is warranted.  He seems to do well with this.  Problem List 1. Stage IV Renal Cancer -radical nephrectomy 07/2008 -metastatic lung lesions s/p thoracotomy/radiation -XRT to sacrum/shoulder/femur  -recurrence with salvage chemotherapy  2. PE -no candidate for AC due to GI bleed/anemia  3. Recurrent GI Bleed/Aneima -recurrent transfusions 4. Diabetes 5. Systolic HF -EF 06-23% 6. CKD III 7. 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 2009   Metastasis to bone ([place]) 2014/2016   Pulmonary embolus [facility])     Past Surgical History: Past Surgical History:  Procedure Laterality Date   BIOPSY   08/28/2019   Procedure: BIOPSY;  Surgeon: Thornton Park, MD;  [facility]: [facility];  Service: Gastroenterology;;   BIOPSY  03/01/2020   Procedure: BIOPSY;  Surgeon: Mauri Pole, MD;  [facility];  Service: Endoscopy;;   BIOPSY  12/19/2020   Procedure: BIOPSY;  Surgeon: Yetta Flock, MD;  [facility];  Service: Gastroenterology;;   BIOPSY  12/20/2020   Procedure: BIOPSY;  Surgeon: Gatha Mayer, MD;  [facility];  Service: Endoscopy;;   COLONOSCOPY W/ POLYPECTOMY     COLONOSCOPY WITH PROPOFOL N/A 03/01/2020   Procedure: COLONOSCOPY WITH PROPOFOL;  Surgeon: Mauri Pole, MD;  [facility];  Service: Endoscopy;  Laterality: N/A;   COLONOSCOPY WITH PROPOFOL N/A 12/20/2020   Procedure: COLONOSCOPY WITH PROPOFOL;  Surgeon: Gatha Mayer, MD;  [facility];  Service: Endoscopy;  Laterality: N/A;   ESOPHAGOGASTRODUODENOSCOPY (EGD) WITH PROPOFOL N/A 08/28/2019   Procedure: ESOPHAGOGASTRODUODENOSCOPY (EGD) WITH PROPOFOL;  Surgeon: Thornton Park, MD;  [facility];  Service: Gastroenterology;  Laterality: N/A;   ESOPHAGOGASTRODUODENOSCOPY (EGD) WITH PROPOFOL N/A 03/01/2020   Procedure: ESOPHAGOGASTRODUODENOSCOPY (EGD) WITH PROPOFOL;  Surgeon: Mauri Pole, MD;  [facility];  Service: Endoscopy;  Laterality: N/A;   ESOPHAGOGASTRODUODENOSCOPY (EGD) WITH PROPOFOL N/A 12/19/2020   Procedure: ESOPHAGOGASTRODUODENOSCOPY (EGD) WITH PROPOFOL;  Surgeon: Yetta Flock, MD;  [facility];  Service: Gastroenterology;  Laterality: N/A;   FEMUR  IM NAIL Right 02/16/2018   Procedure: INTRAMEDULLARY (IM) NAIL RIGHT FEMORAL;  Surgeon: Paralee Cancel, MD;  [facility];  Service: Orthopedics;  Laterality: Right;   GIVENS CAPSULE STUDY N/A 03/03/2020   Procedure: GIVENS CAPSULE STUDY;  Surgeon: Mauri Pole, MD;  [facility];  Service: Endoscopy;  Laterality: N/A;   HAND SURGERY  Right    HEMOSTASIS CLIP PLACEMENT  12/20/2020   Procedure: HEMOSTASIS CLIP PLACEMENT;  Surgeon: Gatha Mayer, MD;  [facility];  Service: Endoscopy;;   HOT HEMOSTASIS N/A 12/20/2020   Procedure: HOT HEMOSTASIS (ARGON PLASMA COAGULATION/BICAP);  Surgeon: Gatha Mayer, MD;  [facility];  Service: Endoscopy;  Laterality: N/A;   HUMERUS IM NAIL Left 04/04/2021   Procedure: INTRAMEDULLARY (IM) NAIL HUMERUS;  Surgeon: Nicholes Stairs, MD;  Location: [place];  Service: Orthopedics;  Laterality: Left;  2 hrs Need  Interventional radiology for immobilization of renal cell tumor- to be done same day prior to [place] LEFT  04/04/2021   IR ANGIOGRAM EXTREMITY RIGHT  02/15/2018   IR ANGIOGRAM SELECTIVE EACH ADDITIONAL VESSEL  02/15/2018   IR ANGIOGRAM SELECTIVE EACH ADDITIONAL VESSEL  02/15/2018   IR ANGIOGRAM SELECTIVE EACH ADDITIONAL VESSEL  02/15/2018   IR ANGIOGRAM SELECTIVE EACH ADDITIONAL VESSEL  02/15/2018   IR ANGIOGRAM SELECTIVE EACH ADDITIONAL VESSEL  04/04/2021   IR ANGIOGRAM SELECTIVE EACH ADDITIONAL VESSEL  04/04/2021   IR EMBO TUMOR ORGAN ISCHEMIA INFARCT INC GUIDE ROADMAPPING  02/15/2018   IR EMBO TUMOR ORGAN ISCHEMIA INFARCT INC GUIDE ROADMAPPING  02/15/2018   IR EMBO TUMOR ORGAN ISCHEMIA INFARCT INC GUIDE ROADMAPPING  04/04/2021   IR US GUIDE VASC ACCESS LEFT  02/15/2018   IR US GUIDE VASC ACCESS RIGHT  04/04/2021   LUNG REMOVAL, PARTIAL Right 09/2008   NEPHRECTOMY RADICAL     Left    PAIN PUMP IMPLANTATION N/A 05/16/2015   Procedure: Intrathecal pain pump placement;  Surgeon: Clydell Hakim, MD;  Location: [place] [facility];  Service: Neurosurgery;  Laterality: N/A;  Intrathecal pain pump placement    Current Medications: Current Meds  Medication Sig   acetaminophen (TYLENOL) 500 MG tablet Take 500 mg by mouth every 6 (six) hours as needed for moderate pain or mild pain.   ALPRAZolam (XANAX) 1 MG tablet TAKE 1 TABLET BY MOUTH EVERY 8 HOURS AS  NEEDED   carvedilol (COREG) 12.5 MG tablet TAKE 1 TABLET (12.5 MG TOTAL) BY MOUTH 2 (TWO) TIMES DAILY WITH A MEAL.   Cyanocobalamin (B-12) 1000 MCG TABS Take 1 tablet (1,000 mcg total) by mouth daily.   diphenhydrAMINE (BENADRYL) 25 MG tablet Take 25 mg by mouth daily.   diphenhydrAMINE (SOMINEX) 25 MG tablet Take 25 mg by mouth.   DULoxetine (CYMBALTA) 60 MG capsule Take 60 mg by mouth.   ferrous sulfate 325 (65 FE) MG tablet TAKE 1 TABLET BY MOUTH EVERY DAY WITH BREAKFAST   furosemide (LASIX) 20 MG tablet Take 1 tablet (20 mg total) by mouth every other day.   gabapentin (NEURONTIN) 300 MG capsule TAKE 1 CAPSULE BY MOUTH THREE TIMES A DAY   Glycerin-Hypromellose-PEG 400 ([facility] DRY EYE RELIEF) 0.2-0.2-1 % SOLN Place 1 drop into both eyes daily as needed (Dry eye).   hydrALAZINE (APRESOLINE) 25 MG tablet Take 1 tablet (25 mg total) by mouth 3 (three) times daily.   HYDROmorphone (DILAUDID) 8 MG tablet Take 8 mg by mouth 3 (three) times daily.   lenvatinib 20 mg daily dose (  LENVIMA, 20 MG DAILY DOSE,) 2 x 10 MG capsule Take 2 capsules (20 mg total) by mouth daily.   Magnesium Oxide 400 MG CAPS Take 1 capsule (400 mg total) by mouth daily.   ondansetron (ZOFRAN) 4 MG tablet TAKE ONE TABLET EVERY MORNING. CAN TAKE ONE TABLET LATER IN THE DAY ON AN AS NEEDED BASIS   PAIN MANAGEMENT INTRATHECAL, IT, PUMP 1 each by Intrathecal route continuous. Intrathecal (IT) medication:   Morphine.  Can push every 4 hours   pantoprazole (PROTONIX) 40 MG tablet Take 1 tablet (40 mg total) by mouth daily.   Sodium Polystyrene Sulfonate (KAYEXALATE PO) Take 15 g by mouth.   SPS 15 GM/60ML suspension Take 15 g by mouth.   tiZANidine (ZANAFLEX) 4 MG tablet Take 4 mg by mouth 3 (three) times daily.   VELTASSA 8.4 g packet Take 1 packet by mouth daily.     Allergies:    Ceftriaxone and Hydrocodone   Social History: Social History   Socioeconomic History   Marital status: Married    Spouse name: [name]   Number of  children: 2   Years of education: Not on file   Highest education level: Not on file  Occupational History   Occupation: Clinical biochemist   Tobacco Use   Smoking status: Every Day    Packs/day: 2.00    Years: 30.00    Pack years: 60.00    Types: Cigarettes   Smokeless tobacco: Never  Vaping Use   Vaping Use: Never used  Substance and Sexual Activity   Alcohol use: No   Drug use: Not Currently    Types: Marijuana    Comment: Last use was on Sunday 03/31/21   Sexual activity: Not Currently  Other Topics Concern   Not on file  Social History Narrative   6 caffeine drinks daily    08-11-18  Unable to ask abuse questions wife with him today.   Social Determinants of Health   Financial Resource Strain: Not on file  Food Insecurity: No Food Insecurity   Worried About Charity fundraiser in the Last Year: Never true   Ran Out of Food in the Last Year: Never true  Transportation Needs: No Transportation Needs   Lack of Transportation (Medical): No   Lack of Transportation (Non-Medical): No  Physical Activity: Not on file  Stress: Not on file  Social Connections: Not on file    Family History: The patient's family history includes Cancer in his maternal aunt; Diabetes in his brother; Hypertension in his mother; Irritable bowel syndrome in his sister. There is no history of Colon cancer.  ROS:   All other ROS reviewed and negative. Pertinent positives noted in the HPI.     EKGs/Labs/Other Studies Reviewed:   The following studies were personally reviewed by me today:  TTE 02/01/2021  1. Left ventricular ejection fraction, by estimation, is 40 to 45%. The  left ventricle has mild to moderately decreased function. The left  ventricle demonstrates global hypokinesis. There is mild left ventricular  hypertrophy. Left ventricular diastolic  parameters are consistent with Grade I diastolic dysfunction (impaired  relaxation).   2. Right ventricular systolic function is normal. The right  ventricular  size is normal. Tricuspid regurgitation signal is inadequate for assessing  PA pressure.   3. Mild diastolic collapse.   4. A small pericardial effusion is present. The pericardial effusion is  circumferential. There is no evidence of cardiac tamponade. Large pleural  effusion in both left and right  lateral regions.   5. The mitral valve is grossly normal. Trivial mitral valve  regurgitation.   6. The aortic valve is tricuspid. Aortic valve regurgitation is not  visualized.   7. The inferior vena cava is dilated in size with <50% respiratory  variability, suggesting right atrial pressure of 15 mmHg.   Recent Labs: 01/31/2021: B Natriuretic Peptide 341.5 02/28/2021: Magnesium 2.1 11/26/2021: ALT 26; BUN 16; Creatinine 1.31; Hemoglobin 13.0; Platelet Count 153; Potassium 4.4; Sodium 135; TSH 4.454   Recent Lipid Panel    Component Value Date/Time   TRIG 250 (H) 03/26/2011 0422    Physical Exam:   VS:  BP (!) 128/98    Pulse 75    Ht _0  (1.905 m)    Wt 167 lb 3.2 oz (75.8 kg)    SpO2 99%    BMI 20.90 kg/m    Wt Readings from Last 3 Encounters:  12/19/21 167 lb 3.2 oz (75.8 kg)  11/26/21 167 lb 1.6 oz (75.8 kg)  10/08/21 165 lb 8 oz (75.1 kg)    General: Well nourished, well developed, in no acute distress Head: Atraumatic, normal size  Eyes: PEERLA, EOMI  Neck: Supple, no JVD Endocrine: No thryomegaly Cardiac: Normal S1, S2; RRR; no murmurs, rubs, or gallops Lungs: Clear to auscultation bilaterally, no wheezing, rhonchi or rales  Abd: Soft, nontender, no hepatomegaly  Ext: No edema, pulses 2+ Musculoskeletal: No deformities, BUE and BLE strength normal and equal Skin: Warm and dry, no rashes   Neuro: Alert and oriented to person, place, time, and situation, CNII-XII grossly intact, no focal deficits  Psych: Normal mood and affect   ASSESSMENT:   Keith [name] is a 55 y.o. male who presents for the following: 1. Chronic systolic heart failure ([place])   2.  Anasarca   3. Primary hypertension     PLAN:   1. Chronic systolic heart failure (HCC) -Diagnosed with systolic heart failure and volume overload.  EF was 40-45%.  This was also in the setting of significant hypoalbuminemia with anasarca.  He has done well.  He is euvolemic on exam.  He is continued on Coreg and hydralazine.  His kidney function was CKD stage III with significant hyperkalemia and this precluded ACE/ARB/Arni/MRA.  Kidney function has improved.  Overall, he does have stage IV renal cancer which is treated palliatively.  He really has no symptoms in office today.  I really see no need to aggressively treat his cardiomyopathy I suspect his function may have improved.  We discussed just continuing current medications.  He is on Coreg 12.5 mg twice daily and hydralazine 25 mg 3 times daily.  We can add Imdur at her next visit.  We will see how he does.  He is on Lasix 20 mg every other day.  I am reluctant to add any other agents that may affect his kidney function given his history of kidney injury with hyperkalemia.  We will see how he does.  2. Anasarca -Well-controlled.  Continue Lasix.  3. Primary hypertension -Controlled today.  Disposition: Return in about 1 year (around 12/19/2022).  Medication Adjustments/Labs and Tests Ordered: Current medicines are reviewed at length with the patient today.  Concerns regarding medicines are outlined above.  No orders of the defined types were placed in this encounter.  No orders of the defined types were placed in this encounter.   Patient Instructions  Medication Instructions:  The current medical regimen is effective;  continue present plan and medications.  *  If you need a refill on your cardiac medications before your next appointment, please call your pharmacy*   Follow-Up: At [facility], you and your health needs are our priority.  As part of our continuing mission to provide you with exceptional heart care, we have  created designated Provider Care Teams.  These Care Teams include your primary Cardiologist (physician) and Advanced Practice Providers (APPs -  Physician Assistants and Nurse Practitioners) who all work together to provide you with the care you need, when you need it.  We recommend signing up for the patient portal called "MyChart".  Sign up information is provided on this After Visit Summary.  MyChart is used to connect with patients for Virtual Visits (Telemedicine).  Patients are able to view lab/test results, encounter notes, upcoming appointments, etc.  Non-urgent messages can be sent to your provider as well.   To learn more about what you can do with MyChart, go to [email or web address].    Your next appointment:   12 month(s)  The format for your next appointment:   In Person  Provider:   Evalina Field, MD       Time Spent with Patient: I have spent a total of 25 minutes with patient reviewing hospital notes, telemetry, EKGs, labs and examining the patient as well as establishing an assessment and plan that was discussed with the patient.  > 50% of time was spent in direct patient care.  Signed, Addison Naegeli. Audie Box, MD, [place]  [address], [place], [place] [address] [phone]  12/19/2021 8:30 AM

## 2021-12-19 NOTE — Patient Instructions (Signed)
Medication Instructions:  °The current medical regimen is effective;  continue present plan and medications. ° °*If you need a refill on your cardiac medications before your next appointment, please call your pharmacy* ° ° °Follow-Up: °At [facility], you and your health needs are our priority.  As part of our continuing mission to provide you with exceptional heart care, we have created designated Provider Care Teams.  These Care Teams include your primary Cardiologist (physician) and Advanced Practice Providers (APPs -  Physician Assistants and Nurse Practitioners) who all work together to provide you with the care you need, when you need it. ° °We recommend signing up for the patient portal called "MyChart".  Sign up information is provided on this After Visit Summary.  MyChart is used to connect with patients for Virtual Visits (Telemedicine).  Patients are able to view lab/test results, encounter notes, upcoming appointments, etc.  Non-urgent messages can be sent to your provider as well.   °To learn more about what you can do with MyChart, go to [email or web address].   ° °Your next appointment:   °12 month(s) ° °The format for your next appointment:   °In Person ° °Provider:   °[place] T [name], MD   ° ° ° °

## 2021-12-24 DIAGNOSIS — N1831 Chronic kidney disease, stage 3a: Secondary | ICD-10-CM | POA: Diagnosis not present

## 2021-12-26 ENCOUNTER — Other Ambulatory Visit: Payer: Self-pay | Admitting: Oncology

## 2021-12-26 DIAGNOSIS — C649 Malignant neoplasm of unspecified kidney, except renal pelvis: Secondary | ICD-10-CM

## 2021-12-30 ENCOUNTER — Other Ambulatory Visit: Payer: Self-pay | Admitting: Neurological Surgery

## 2021-12-30 DIAGNOSIS — C642 Malignant neoplasm of left kidney, except renal pelvis: Secondary | ICD-10-CM | POA: Diagnosis not present

## 2021-12-30 DIAGNOSIS — D631 Anemia in chronic kidney disease: Secondary | ICD-10-CM | POA: Diagnosis not present

## 2021-12-30 DIAGNOSIS — N1831 Chronic kidney disease, stage 3a: Secondary | ICD-10-CM | POA: Diagnosis not present

## 2021-12-30 DIAGNOSIS — N2581 Secondary hyperparathyroidism of renal origin: Secondary | ICD-10-CM | POA: Diagnosis not present

## 2021-12-30 NOTE — Pre-Procedure Instructions (Signed)
Surgical Instructions    Your procedure is scheduled on Thursday, January 12th.  Report to [facility] Main Entrance "A" at 05:30 A.M., then check in with the Admitting office.  Call this number if you have problems the morning of surgery:  [phone]   If you have any questions prior to your surgery date call [phone]: Open Monday-Friday 8am-4pm    Remember:  Do not eat after midnight the night before your surgery  You may drink clear liquids until 04:30 AM the morning of your surgery.   Clear liquids allowed are: Water, Non-Citrus Juices (without pulp), Carbonated Beverages, Clear Tea, Black Coffee Only, and Gatorade    Take these medicines the morning of surgery with A SIP OF WATER  carvedilol (COREG) diphenhydrAMINE (BENADRYL) gabapentin (NEURONTIN) hydrALAZINE (APRESOLINE)  HYDROmorphone (DILAUDID)  lenvatinib (LENVIMA) ondansetron (ZOFRAN) pantoprazole (PROTONIX) 40 MG  tiZANidine (ZANAFLEX)   If needed: ALPRAZolam Duanne Moron)  Glycerin-Hypromellose-PEG 400 ([facility] DRY EYE RELIEF)   As of today, STOP taking any Aspirin (unless otherwise instructed by your surgeon) Aleve, Naproxen, Ibuprofen, Motrin, Advil, Goody's, BC's, all herbal medications, fish oil, and all vitamins.                     Do NOT Smoke (Tobacco/Vaping) or drink Alcohol 24 hours prior to your procedure.  If you use a CPAP at night, you may bring all equipment for your overnight stay.   Contacts, glasses, piercing's, hearing aid's, dentures or partials may not be worn into surgery, please bring cases for these belongings.    For patients admitted to the hospital, discharge time will be determined by your treatment team.   Patients discharged the day of surgery will not be allowed to drive home, and someone needs to stay with them for 24 hours.  NO VISITORS WILL BE ALLOWED IN PRE-OP WHERE PATIENTS GET READY FOR SURGERY.  ONLY 1 SUPPORT PERSON MAY BE PRESENT IN THE WAITING ROOM WHILE YOU ARE IN  SURGERY.  IF YOU ARE TO BE ADMITTED, ONCE YOU ARE IN YOUR ROOM YOU WILL BE ALLOWED TWO (2) VISITORS.  Minor children may have two parents present. Special consideration for safety and communication needs will be reviewed on a case by case basis.   Special instructions:   [place]- Preparing For Surgery  Before surgery, you can play an important role. Because skin is not sterile, your skin needs to be as free of germs as possible. You can reduce the number of germs on your skin by washing with CHG (chlorahexidine gluconate) Soap before surgery.  CHG is an antiseptic cleaner which kills germs and bonds with the skin to continue killing germs even after washing.    Oral Hygiene is also important to reduce your risk of infection.  Remember - BRUSH YOUR TEETH THE MORNING OF SURGERY WITH YOUR REGULAR TOOTHPASTE  Please do not use if you have an allergy to CHG or antibacterial soaps. If your skin becomes reddened/irritated stop using the CHG.  Do not shave (including legs and underarms) for at least 48 hours prior to first CHG shower. It is OK to shave your face.  Please follow these instructions carefully.   Shower the NIGHT BEFORE SURGERY and the MORNING OF SURGERY  If you chose to wash your hair, wash your hair first as usual with your normal shampoo.  After you shampoo, rinse your hair and body thoroughly to remove the shampoo.  Use CHG Soap as you would any other liquid soap. You can  apply CHG directly to the skin and wash gently with a scrungie or a clean washcloth.   Apply the CHG Soap to your body ONLY FROM THE NECK DOWN.  Do not use on open wounds or open sores. Avoid contact with your eyes, ears, mouth and genitals (private parts). Wash Face and genitals (private parts)  with your normal soap.   Wash thoroughly, paying special attention to the area where your surgery will be performed.  Thoroughly rinse your body with warm water from the neck down.  DO NOT shower/wash with your  normal soap after using and rinsing off the CHG Soap.  Pat yourself dry with a CLEAN TOWEL.  Wear CLEAN PAJAMAS to bed the night before surgery  Place CLEAN SHEETS on your bed the night before your surgery  DO NOT SLEEP WITH PETS.   Day of Surgery: Shower with CHG soap. Do not wear jewelry Do not wear lotions, powders, colognes, or deodorant. Men may shave face and neck. Do not bring valuables to the hospital. [facility] is not responsible for any belongings or valuables. Wear Clean/Comfortable clothing the morning of surgery Remember to brush your teeth WITH YOUR REGULAR TOOTHPASTE.   Please read over the following fact sheets that you were given.   3 days prior to your procedure or After your COVID test   You are not required to quarantine however you are required to wear a well-fitting mask when you are out and around people not in your household. If your mask becomes wet or soiled, replace with a new one.   Wash your hands often with soap and water for 20 seconds or clean your hands with an alcohol-based hand sanitizer that contains at least 60% alcohol.   Do not share personal items.   Notify your provider:  o if you are in close contact with someone who has COVID  o or if you develop a fever of 100.4 or greater, sneezing, cough, sore throat, shortness of breath or body aches.

## 2021-12-31 ENCOUNTER — Other Ambulatory Visit: Payer: Self-pay

## 2021-12-31 ENCOUNTER — Encounter (HOSPITAL_COMMUNITY)
Admission: RE | Admit: 2021-12-31 | Discharge: 2021-12-31 | Disposition: A | Discharge status: Home / Self Care | Payer: Medicare Other | Source: Ambulatory Visit | Attending: Neurological Surgery | Admitting: Neurological Surgery

## 2021-12-31 ENCOUNTER — Encounter (HOSPITAL_COMMUNITY): Payer: Self-pay

## 2021-12-31 VITALS — BP 113/84 | HR 85 | Temp 97.8°F | Resp 17 | Ht 75.0 in | Wt 168.9 lb

## 2021-12-31 DIAGNOSIS — E875 Hyperkalemia: Secondary | ICD-10-CM | POA: Insufficient documentation

## 2021-12-31 DIAGNOSIS — R601 Generalized edema: Secondary | ICD-10-CM | POA: Diagnosis not present

## 2021-12-31 DIAGNOSIS — C7800 Secondary malignant neoplasm of unspecified lung: Secondary | ICD-10-CM | POA: Diagnosis not present

## 2021-12-31 DIAGNOSIS — Z20822 Contact with and (suspected) exposure to covid-19: Secondary | ICD-10-CM | POA: Insufficient documentation

## 2021-12-31 DIAGNOSIS — D649 Anemia, unspecified: Secondary | ICD-10-CM | POA: Insufficient documentation

## 2021-12-31 DIAGNOSIS — Z9221 Personal history of antineoplastic chemotherapy: Secondary | ICD-10-CM | POA: Diagnosis not present

## 2021-12-31 DIAGNOSIS — K922 Gastrointestinal hemorrhage, unspecified: Secondary | ICD-10-CM | POA: Insufficient documentation

## 2021-12-31 DIAGNOSIS — I130 Hypertensive heart and chronic kidney disease with heart failure and stage 1 through stage 4 chronic kidney disease, or unspecified chronic kidney disease: Secondary | ICD-10-CM | POA: Insufficient documentation

## 2021-12-31 DIAGNOSIS — N183 Chronic kidney disease, stage 3 unspecified: Secondary | ICD-10-CM | POA: Insufficient documentation

## 2021-12-31 DIAGNOSIS — C649 Malignant neoplasm of unspecified kidney, except renal pelvis: Secondary | ICD-10-CM | POA: Diagnosis not present

## 2021-12-31 DIAGNOSIS — Z905 Acquired absence of kidney: Secondary | ICD-10-CM | POA: Insufficient documentation

## 2021-12-31 DIAGNOSIS — Z923 Personal history of irradiation: Secondary | ICD-10-CM | POA: Diagnosis not present

## 2021-12-31 DIAGNOSIS — E1122 Type 2 diabetes mellitus with diabetic chronic kidney disease: Secondary | ICD-10-CM | POA: Diagnosis not present

## 2021-12-31 DIAGNOSIS — I5020 Unspecified systolic (congestive) heart failure: Secondary | ICD-10-CM | POA: Insufficient documentation

## 2021-12-31 DIAGNOSIS — Z01818 Encounter for other preprocedural examination: Secondary | ICD-10-CM

## 2021-12-31 DIAGNOSIS — Z01812 Encounter for preprocedural laboratory examination: Secondary | ICD-10-CM | POA: Insufficient documentation

## 2021-12-31 DIAGNOSIS — Z86711 Personal history of pulmonary embolism: Secondary | ICD-10-CM | POA: Insufficient documentation

## 2021-12-31 DIAGNOSIS — Z79899 Other long term (current) drug therapy: Secondary | ICD-10-CM | POA: Insufficient documentation

## 2021-12-31 DIAGNOSIS — E8809 Other disorders of plasma-protein metabolism, not elsewhere classified: Secondary | ICD-10-CM | POA: Insufficient documentation

## 2021-12-31 DIAGNOSIS — I429 Cardiomyopathy, unspecified: Secondary | ICD-10-CM | POA: Insufficient documentation

## 2021-12-31 DIAGNOSIS — I10 Essential (primary) hypertension: Secondary | ICD-10-CM

## 2021-12-31 DIAGNOSIS — G894 Chronic pain syndrome: Secondary | ICD-10-CM | POA: Diagnosis not present

## 2021-12-31 LAB — BASIC METABOLIC PANEL
Anion gap: 8 (ref 5–15)
BUN: 17 mg/dL (ref 6–20)
CO2: 28 mmol/L (ref 22–32)
Calcium: 9.6 mg/dL (ref 8.9–10.3)
Chloride: 99 mmol/L (ref 98–111)
Creatinine, Ser: 1.45 mg/dL — ABNORMAL HIGH (ref 0.61–1.24)
GFR, Estimated: 57 mL/min — ABNORMAL LOW (ref >60)
Glucose, Bld: 107 mg/dL — ABNORMAL HIGH (ref 70–99)
Potassium: 4.8 mmol/L (ref 3.5–5.1)
Sodium: 135 mmol/L (ref 135–145)

## 2021-12-31 LAB — CBC
HCT: 45.1 % (ref 39.0–52.0)
Hemoglobin: 13.8 g/dL (ref 13.0–17.0)
MCH: 30.5 pg (ref 26.0–34.0)
MCHC: 30.6 g/dL (ref 30.0–36.0)
MCV: 99.8 fL (ref 80.0–100.0)
Platelets: 160 10*3/uL (ref 150–400)
RBC: 4.52 MIL/uL (ref 4.22–5.81)
RDW: 16.6 % — ABNORMAL HIGH (ref 11.5–15.5)
WBC: 6.6 10*3/uL (ref 4.0–10.5)
nRBC: 0 % (ref 0.0–0.2)

## 2021-12-31 LAB — SARS CORONAVIRUS 2 (TAT 6-24 HRS): SARS Coronavirus 2: NEGATIVE

## 2021-12-31 NOTE — Progress Notes (Signed)
PCP - Dr. [name] Cardiologist - Dr. [place] T. [name]  PPM/ICD - denies   Chest x-ray - 02/16/21 EKG - 02/28/21 Stress Test - denies ECHO - 02/01/21 Cardiac Cath - denies  Sleep Study - denies  DM- denies  Blood Thinner Instructions: n/a Aspirin Instructions: n/a  ERAS Protcol - yes, no drink   COVID TEST- 12/31/21 at PAT   Anesthesia review: yes, cardiac hx  Patient denies shortness of breath, fever, cough and chest pain at PAT appointment   All instructions explained to the patient, with a verbal understanding of the material. Patient agrees to go over the instructions while at home for a better understanding. Patient also instructed to wear a mask in public after being tested for COVID-19. The opportunity to ask questions was provided.

## 2022-01-01 NOTE — Progress Notes (Signed)
Anesthesia Chart Review:  Follows with cardiology for history of systolic heart failure (EF 40 to 45%, diagnosed in the setting of significant hypoalbuminemia and anasarca), PE (not candidate for [facility] due to recurrent GI bleed/anemia).  Last seen by Dr. Audie Box 12/19/2021 and noted to be euvolemic at that well, doing well from cardiac standpoint.  Per note, "He is euvolemic on exam.  He is continued on Coreg and hydralazine.  His kidney function was CKD stage III with significant hyperkalemia and this precluded ACE/ARB/Arni/MRA.  Kidney function has improved.  Overall, he does have stage IV renal cancer which is treated palliatively.  He really has no symptoms in office today.  I really see no need to aggressively treat his cardiomyopathy I suspect his function may have improved.  We discussed just continuing current medications.  He is on Coreg 12.5 mg twice daily and hydralazine 25 mg 3 times daily.  We can add Imdur at her next visit.  We will see how he does.  He is on Lasix 20 mg every other day.  I am reluctant to add any other agents that may affect his kidney function given his history of kidney injury with hyperkalemia.  We will see how he does."  He was advised to follow-up in 1 year.  Patient previously received cardiac clearance per telephone encounter 11/06/2021, "Chart reviewed as part of pre-operative protocol coverage. Given past medical history and time since last visit, based on ACC/AHA guidelines, GARNET OVERFIELD would be at acceptable risk for the planned procedure without further cardiovascular testing."  Stage IV renal cancer on palliative chemotherapy.  S/p radical nephrectomy 07/2008.  Metastatic lung lesions s/p thoracotomy/radiation 2009 with subsequent XRT to sacrum/shoulder/femur.  Recurrence with salvage chemotherapy.  This is followed by Dr. [name].  DM2 listed in history however patient is not on any antidiabetic medications and prior A1c is available for review in epic are  <5.0.  Preop labs reviewed, creatinine mildly elevated 1.45 consistent with history of CKD 3, otherwise unremarkable.  EKG 02/28/2021: Sinus rhythm.  Rate 63. Borderline repolarization abnormality  TTE 02/01/2021:  1. Left ventricular ejection fraction, by estimation, is 40 to 45%. The  left ventricle has mild to moderately decreased function. The left  ventricle demonstrates global hypokinesis. There is mild left ventricular  hypertrophy. Left ventricular diastolic  parameters are consistent with Grade I diastolic dysfunction (impaired  relaxation).   2. Right ventricular systolic function is normal. The right ventricular  size is normal. Tricuspid regurgitation signal is inadequate for assessing  PA pressure.   3. Mild diastolic collapse.   4. A small pericardial effusion is present. The pericardial effusion is  circumferential. There is no evidence of cardiac tamponade. Large pleural  effusion in both left and right lateral regions.   5. The mitral valve is grossly normal. Trivial mitral valve  regurgitation.   6. The aortic valve is tricuspid. Aortic valve regurgitation is not  visualized.   7. The inferior vena cava is dilated in size with <50% respiratory  variability, suggesting right atrial pressure of 15 mmHg.    Wynonia Musty [facility]/Anesthesiology Phone [phone] 01/01/2022 9:08 AM

## 2022-01-01 NOTE — Anesthesia Preprocedure Evaluation (Addendum)
Anesthesia Evaluation  Patient identified by MRN, date of birth, ID band Patient awake    Reviewed: Allergy & Precautions, NPO status , Patient's Chart, lab work & pertinent test results  Airway Mallampati: II  TM Distance: >3 FB Neck ROM: Full    Dental  (+) Edentulous Upper, Edentulous Lower   Pulmonary COPD, Current Smoker and Patient abstained from smoking., PE   Pulmonary exam normal        Cardiovascular hypertension, Pt. on medications and Pt. on home beta blockers +CHF   Rhythm:Regular Rate:Normal     Neuro/Psych Anxiety Depression    GI/Hepatic Neg liver ROS, GERD  Medicated,  Endo/Other  diabetes, Well Controlled  Renal/GU      Musculoskeletal Chronic pain 2/2 metastatic renal Ca    Abdominal Normal abdominal exam  (+)   Peds  Hematology  (+) anemia ,   Anesthesia Other Findings   Reproductive/Obstetrics                           Anesthesia Physical Anesthesia Plan  ASA: 3  Anesthesia Plan: General   Post-op Pain Management:    Induction: Intravenous  PONV Risk Score and Plan: 1 and Ondansetron, Dexamethasone, Midazolam and Treatment may vary due to age or medical condition  Airway Management Planned: Mask and Oral ETT  Additional Equipment: None  Intra-op Plan:   Post-operative Plan: Extubation in OR  Informed Consent: I have reviewed the patients History and Physical, chart, labs and discussed the procedure including the risks, benefits and alternatives for the proposed anesthesia with the patient or authorized representative who has indicated his/her understanding and acceptance.     Dental advisory given  Plan Discussed with: CRNA  Anesthesia Plan Comments: (Lab Results      Component                Value               Date                      WBC                      6.6                 12/31/2021                HGB                      13.8                 12/31/2021                HCT                      45.1                12/31/2021                MCV                      99.8                12/31/2021                PLT  160                 12/31/2021           Lab Results      Component                Value               Date                      NA                       135                 12/31/2021                K                        4.8                 12/31/2021                CO2                      28                  12/31/2021                GLUCOSE                  107 (H)             12/31/2021                BUN                      17                  12/31/2021                CREATININE               1.45 (H)            12/31/2021                CALCIUM                  9.6                 12/31/2021                EGFR                     57 (L)              02/27/2021                GFRNONAA                 57 (L)              12/31/2021           PAT note by Karoline Caldwell, PA-C: Follows with cardiology for history of systolic heart failure (EF 40 to 45%, diagnosed in the setting of significant hypoalbuminemia and anasarca), PE (not candidate for [facility] due to recurrent GI bleed/anemia).  Last seen by Dr. Audie Box 12/19/2021 and noted to be  euvolemic at that well, doing well from cardiac standpoint.  Per note, "He is euvolemic on exam. He is continued on Coreg and hydralazine. His kidney function was CKD stage III with significant hyperkalemia and this precluded ACE/ARB/Arni/MRA. Kidney function has improved. Overall, he does have stage IV renal cancer which is treated palliatively.He really has no symptoms in office today. I really see no need to aggressively treat his cardiomyopathy I suspect his function may have improved. We discussed just continuing current medications. He is on Coreg 12.5 mg twice daily and hydralazine 25 mg 3 times daily. We can add Imdur at her next visit. We will see how he  does. He is on Lasix 20 mg every other day. I am reluctant to add any other agents that may affect his kidney function given his history of kidney injury with hyperkalemia. We will see how he does."  He was advised to follow-up in 1 year.  Patient previously received cardiac clearance per telephone encounter 11/06/2021, "Chart reviewed as part of pre-operative protocol coverage. Given past medical history and time since last visit, based on ACC/AHA guidelines,[name] Hopperwould be at acceptable risk for the planned procedure without further cardiovascular testing."  Stage IV renal cancer on palliative chemotherapy.  S/p radical nephrectomy 07/2008.  Metastatic lung lesions s/p thoracotomy/radiation 2009 with subsequent XRT to sacrum/shoulder/femur.  Recurrence with salvage chemotherapy.  This is followed by Dr. [name].  DM2 listed in history however patient is not on any antidiabetic medications and prior A1c is available for review in epic are <5.0.  Preop labs reviewed, creatinine mildly elevated 1.45 consistent with history of CKD 3, otherwise unremarkable.  EKG 02/28/2021: Sinus rhythm.  Rate 63. Borderline repolarization abnormality  TTE 02/01/2021: 1. Left ventricular ejection fraction, by estimation, is 40 to 45%. The  left ventricle has mild to moderately decreased function. The left  ventricle demonstrates global hypokinesis. There is mild left ventricular  hypertrophy. Left ventricular diastolic  parameters are consistent with Grade I diastolic dysfunction (impaired  relaxation).  2. Right ventricular systolic function is normal. The right ventricular  size is normal. Tricuspid regurgitation signal is inadequate for assessing  PA pressure.  3. Mild diastolic collapse.  4. A small pericardial effusion is present. The pericardial effusion is  circumferential. There is no evidence of cardiac tamponade. Large pleural  effusion in both left and right lateral regions.  5. The  mitral valve is grossly normal. Trivial mitral valve  regurgitation.  6. The aortic valve is tricuspid. Aortic valve regurgitation is not  visualized.  7. The inferior vena cava is dilated in size with <50% respiratory  variability, suggesting right atrial pressure of 15 mmHg.  )      Anesthesia Quick Evaluation

## 2022-01-02 ENCOUNTER — Ambulatory Visit (HOSPITAL_COMMUNITY)
Admission: RE | Admit: 2022-01-02 | Discharge: 2022-01-02 | Disposition: A | Discharge status: Home / Self Care | Payer: Medicare Other | Attending: Neurological Surgery | Admitting: Neurological Surgery

## 2022-01-02 ENCOUNTER — Ambulatory Visit (HOSPITAL_COMMUNITY): Payer: Medicare Other | Admitting: Physician Assistant

## 2022-01-02 ENCOUNTER — Encounter (HOSPITAL_COMMUNITY)
Admission: RE | Disposition: A | Discharge status: Home / Self Care | Payer: Self-pay | Source: Home / Self Care | Attending: Neurological Surgery

## 2022-01-02 ENCOUNTER — Encounter (HOSPITAL_COMMUNITY): Payer: Self-pay | Admitting: Neurological Surgery

## 2022-01-02 ENCOUNTER — Other Ambulatory Visit: Payer: Self-pay

## 2022-01-02 DIAGNOSIS — I509 Heart failure, unspecified: Secondary | ICD-10-CM | POA: Insufficient documentation

## 2022-01-02 DIAGNOSIS — E119 Type 2 diabetes mellitus without complications: Secondary | ICD-10-CM | POA: Insufficient documentation

## 2022-01-02 DIAGNOSIS — I110 Hypertensive heart disease with heart failure: Secondary | ICD-10-CM | POA: Diagnosis not present

## 2022-01-02 DIAGNOSIS — F418 Other specified anxiety disorders: Secondary | ICD-10-CM | POA: Diagnosis not present

## 2022-01-02 DIAGNOSIS — Z4549 Encounter for adjustment and management of other implanted nervous system device: Secondary | ICD-10-CM | POA: Insufficient documentation

## 2022-01-02 DIAGNOSIS — G894 Chronic pain syndrome: Secondary | ICD-10-CM | POA: Insufficient documentation

## 2022-01-02 DIAGNOSIS — C649 Malignant neoplasm of unspecified kidney, except renal pelvis: Secondary | ICD-10-CM | POA: Diagnosis not present

## 2022-01-02 DIAGNOSIS — D649 Anemia, unspecified: Secondary | ICD-10-CM | POA: Insufficient documentation

## 2022-01-02 DIAGNOSIS — F17210 Nicotine dependence, cigarettes, uncomplicated: Secondary | ICD-10-CM | POA: Diagnosis not present

## 2022-01-02 DIAGNOSIS — Z451 Encounter for adjustment and management of infusion pump: Secondary | ICD-10-CM | POA: Diagnosis not present

## 2022-01-02 DIAGNOSIS — K219 Gastro-esophageal reflux disease without esophagitis: Secondary | ICD-10-CM | POA: Diagnosis not present

## 2022-01-02 DIAGNOSIS — J449 Chronic obstructive pulmonary disease, unspecified: Secondary | ICD-10-CM | POA: Insufficient documentation

## 2022-01-02 DIAGNOSIS — C7951 Secondary malignant neoplasm of bone: Secondary | ICD-10-CM | POA: Diagnosis not present

## 2022-01-02 DIAGNOSIS — D630 Anemia in neoplastic disease: Secondary | ICD-10-CM | POA: Diagnosis not present

## 2022-01-02 HISTORY — PX: INTRATHECAL PUMP IMPLANT: SHX6809

## 2022-01-02 SURGERY — INTRATHECAL PUMP IMPLANT
Anesthesia: General | Site: Abdomen | Laterality: Left

## 2022-01-02 MED ORDER — LIDOCAINE-EPINEPHRINE 1 %-1:100000 IJ SOLN
INTRAMUSCULAR | Status: DC | PRN
Start: 2022-01-02 — End: 2022-01-02
  Administered 2022-01-02: 3.5 mL

## 2022-01-02 MED ORDER — PROMETHAZINE HCL 25 MG/ML IJ SOLN: 6.2500 mg | INTRAMUSCULAR | Status: DC | PRN

## 2022-01-02 MED ORDER — ONDANSETRON HCL 4 MG/2ML IJ SOLN
INTRAMUSCULAR | Status: DC | PRN
Start: 2022-01-02 — End: 2022-01-02
  Administered 2022-01-02: 4 mg via INTRAVENOUS

## 2022-01-02 MED ORDER — ACETAMINOPHEN 10 MG/ML IV SOLN: 1000.0000 mg | Freq: Once | INTRAVENOUS | Status: DC | PRN

## 2022-01-02 MED ORDER — DEXAMETHASONE SODIUM PHOSPHATE 10 MG/ML IJ SOLN
INTRAMUSCULAR | Status: DC | PRN
Start: 2022-01-02 — End: 2022-01-02
  Administered 2022-01-02: 8 mg via INTRAVENOUS

## 2022-01-02 MED ORDER — VANCOMYCIN HCL 1000 MG IV SOLR
INTRAVENOUS | Status: DC | PRN
Start: 2022-01-02 — End: 2022-01-02
  Administered 2022-01-02: 1000 mg

## 2022-01-02 MED ORDER — MIDAZOLAM HCL 2 MG/2ML IJ SOLN
INTRAMUSCULAR | Status: AC
  Filled 2022-01-02: qty 2

## 2022-01-02 MED ORDER — BUPIVACAINE HCL (PF) 0.5 % IJ SOLN
INTRAMUSCULAR | Status: DC | PRN
  Administered 2022-01-02: 3.5 mL

## 2022-01-02 MED ORDER — NON FORMULARY
Status: DC | PRN
  Administered 2022-01-02: 40 mL via SURGICAL_CAVITY

## 2022-01-02 MED ORDER — ROCURONIUM BROMIDE 10 MG/ML (PF) SYRINGE
PREFILLED_SYRINGE | INTRAVENOUS | Status: DC | PRN
  Administered 2022-01-02: 50 mg via INTRAVENOUS

## 2022-01-02 MED ORDER — CHLORHEXIDINE GLUCONATE CLOTH 2 % EX PADS: 6.0000 | MEDICATED_PAD | Freq: Once | CUTANEOUS | Status: DC

## 2022-01-02 MED ORDER — ROCURONIUM BROMIDE 10 MG/ML (PF) SYRINGE
PREFILLED_SYRINGE | INTRAVENOUS | Status: AC
  Filled 2022-01-02: qty 10

## 2022-01-02 MED ORDER — PHENYLEPHRINE HCL-NACL 20-0.9 MG/250ML-% IV SOLN
INTRAVENOUS | Status: DC | PRN
Start: 2022-01-02 — End: 2022-01-02
  Administered 2022-01-02: 20 ug/min via INTRAVENOUS

## 2022-01-02 MED ORDER — BUPIVACAINE HCL (PF) 0.5 % IJ SOLN
INTRAMUSCULAR | Status: AC
  Filled 2022-01-02: qty 30

## 2022-01-02 MED ORDER — LIDOCAINE 2% (20 MG/ML) 5 ML SYRINGE
INTRAMUSCULAR | Status: AC
  Filled 2022-01-02: qty 5

## 2022-01-02 MED ORDER — MIDAZOLAM HCL 5 MG/5ML IJ SOLN
INTRAMUSCULAR | Status: DC | PRN
Start: 2022-01-02 — End: 2022-01-02
  Administered 2022-01-02: 2 mg via INTRAVENOUS

## 2022-01-02 MED ORDER — ORAL CARE MOUTH RINSE: 15.0000 mL | Freq: Once | OROMUCOSAL | Status: AC

## 2022-01-02 MED ORDER — PROPOFOL 10 MG/ML IV BOLUS
INTRAVENOUS | Status: AC
  Filled 2022-01-02: qty 20

## 2022-01-02 MED ORDER — VANCOMYCIN HCL 1000 MG IV SOLR
INTRAVENOUS | Status: AC
  Filled 2022-01-02: qty 20

## 2022-01-02 MED ORDER — ONDANSETRON HCL 4 MG/2ML IJ SOLN
INTRAMUSCULAR | Status: AC
  Filled 2022-01-02: qty 2

## 2022-01-02 MED ORDER — VANCOMYCIN HCL IN DEXTROSE 1-5 GM/200ML-% IV SOLN
1000.0000 mg | INTRAVENOUS | Status: AC
  Administered 2022-01-02: 1000 mg via INTRAVENOUS
  Filled 2022-01-02: qty 200

## 2022-01-02 MED ORDER — FENTANYL CITRATE (PF) 100 MCG/2ML IJ SOLN: 25.0000 ug | INTRAMUSCULAR | Status: DC | PRN

## 2022-01-02 MED ORDER — THROMBIN 5000 UNITS EX SOLR
CUTANEOUS | Status: AC
  Filled 2022-01-02: qty 10000

## 2022-01-02 MED ORDER — CHLORHEXIDINE GLUCONATE 0.12 % MT SOLN
15.0000 mL | Freq: Once | OROMUCOSAL | Status: AC
  Administered 2022-01-02: 15 mL via OROMUCOSAL
  Filled 2022-01-02: qty 15

## 2022-01-02 MED ORDER — PHENYLEPHRINE 40 MCG/ML (10ML) SYRINGE FOR IV PUSH (FOR BLOOD PRESSURE SUPPORT)
PREFILLED_SYRINGE | INTRAVENOUS | Status: AC
  Filled 2022-01-02: qty 10

## 2022-01-02 MED ORDER — SODIUM CHLORIDE 0.9 % IV SOLN: INTRAVENOUS | Status: DC | PRN

## 2022-01-02 MED ORDER — FENTANYL CITRATE (PF) 250 MCG/5ML IJ SOLN
INTRAMUSCULAR | Status: DC | PRN
  Administered 2022-01-02 (×3): 50 ug via INTRAVENOUS
  Administered 2022-01-02: 100 ug via INTRAVENOUS

## 2022-01-02 MED ORDER — PROPOFOL 10 MG/ML IV BOLUS
INTRAVENOUS | Status: DC | PRN
Start: 2022-01-02 — End: 2022-01-02
  Administered 2022-01-02: 140 mg via INTRAVENOUS

## 2022-01-02 MED ORDER — LACTATED RINGERS IV SOLN: INTRAVENOUS | Status: DC

## 2022-01-02 MED ORDER — 0.9 % SODIUM CHLORIDE (POUR BTL) OPTIME
TOPICAL | Status: DC | PRN
  Administered 2022-01-02: 1000 mL

## 2022-01-02 MED ORDER — SUCCINYLCHOLINE CHLORIDE 200 MG/10ML IV SOSY
PREFILLED_SYRINGE | INTRAVENOUS | Status: AC
  Filled 2022-01-02: qty 10

## 2022-01-02 MED ORDER — FENTANYL CITRATE (PF) 250 MCG/5ML IJ SOLN
INTRAMUSCULAR | Status: AC
  Filled 2022-01-02: qty 5

## 2022-01-02 MED ORDER — LIDOCAINE 2% (20 MG/ML) 5 ML SYRINGE
INTRAMUSCULAR | Status: DC | PRN
  Administered 2022-01-02: 60 mg via INTRAVENOUS

## 2022-01-02 MED ORDER — SUGAMMADEX SODIUM 200 MG/2ML IV SOLN
INTRAVENOUS | Status: DC | PRN
  Administered 2022-01-02: 200 mg via INTRAVENOUS

## 2022-01-02 MED ORDER — LIDOCAINE-EPINEPHRINE 1 %-1:100000 IJ SOLN
INTRAMUSCULAR | Status: AC
  Filled 2022-01-02: qty 1

## 2022-01-02 MED ORDER — DEXAMETHASONE SODIUM PHOSPHATE 10 MG/ML IJ SOLN
INTRAMUSCULAR | Status: AC
  Filled 2022-01-02: qty 1

## 2022-01-02 SURGICAL SUPPLY — 66 items
ADH SKN CLS APL DERMABOND .7 (GAUZE/BANDAGES/DRESSINGS) ×1
BAG COUNTER SPONGE SURGICOUNT (BAG) ×2 IMPLANT
BAG SPNG CNTER NS LX DISP (BAG) ×1
BLADE CLIPPER SURG (BLADE) ×2 IMPLANT
BLADE SURG 10 STRL SS (BLADE) ×3 IMPLANT
BLADE SURG 15 STRL LF DISP TIS (BLADE) ×1 IMPLANT
BLADE SURG 15 STRL SS (BLADE) ×2
BOOT SUTURE AID YELLOW STND (SUTURE) ×2 IMPLANT
CABLE BIPOLOR RESECTION CORD (MISCELLANEOUS) ×2 IMPLANT
CANISTER SUCT 3000ML PPV (MISCELLANEOUS) ×2 IMPLANT
COVER MAYO STAND STRL (DRAPES) ×2 IMPLANT
DECANTER SPIKE VIAL GLASS SM (MISCELLANEOUS) ×2 IMPLANT
DERMABOND ADVANCED (GAUZE/BANDAGES/DRESSINGS) ×1
DERMABOND ADVANCED .7 DNX12 (GAUZE/BANDAGES/DRESSINGS) IMPLANT
DRAPE C-ARM 42X72 X-RAY (DRAPES) ×1 IMPLANT
DRAPE INCISE IOBAN 85X60 (DRAPES) ×3 IMPLANT
DRAPE LAPAROTOMY 100X72X124 (DRAPES) ×3 IMPLANT
DRSG OPSITE POSTOP 4X8 (GAUZE/BANDAGES/DRESSINGS) ×1 IMPLANT
DRSG PAD ABDOMINAL 8X10 ST (GAUZE/BANDAGES/DRESSINGS) IMPLANT
ELECT REM PT RETURN 9FT ADLT (ELECTROSURGICAL) ×2
ELECTRODE REM PT RTRN 9FT ADLT (ELECTROSURGICAL) ×1 IMPLANT
GAUZE 4X4 16PLY ~~LOC~~+RFID DBL (SPONGE) ×2 IMPLANT
GAUZE SPONGE 4X4 12PLY STRL (GAUZE/BANDAGES/DRESSINGS) IMPLANT
GLOVE EXAM NITRILE XL STR (GLOVE) IMPLANT
GLOVE SRG 8 PF TXTR STRL LF DI (GLOVE) ×1 IMPLANT
GLOVE SURG ENC MOIS LTX SZ8 (GLOVE) ×3 IMPLANT
GLOVE SURG UNDER POLY LF SZ7 (GLOVE) ×2 IMPLANT
GLOVE SURG UNDER POLY LF SZ7.5 (GLOVE) ×2 IMPLANT
GLOVE SURG UNDER POLY LF SZ8 (GLOVE) ×4
GOWN STRL REUS W/ TWL LRG LVL3 (GOWN DISPOSABLE) IMPLANT
GOWN STRL REUS W/ TWL XL LVL3 (GOWN DISPOSABLE) IMPLANT
GOWN STRL REUS W/TWL 2XL LVL3 (GOWN DISPOSABLE) IMPLANT
GOWN STRL REUS W/TWL LRG LVL3 (GOWN DISPOSABLE) ×4
GOWN STRL REUS W/TWL XL LVL3 (GOWN DISPOSABLE) ×4
KIT BASIN OR (CUSTOM PROCEDURE TRAY) ×3 IMPLANT
KIT CATHETER ACCESS PORT (KITS) ×1 IMPLANT
KIT TURNOVER KIT B (KITS) ×3 IMPLANT
MANAGER PERSONAL PAT THERAPY (MISCELLANEOUS) ×1 IMPLANT
NDL HYPO 18GX1.5 BLUNT FILL (NEEDLE) IMPLANT
NDL HYPO 25X1 1.5 SAFETY (NEEDLE) ×2 IMPLANT
NEEDLE HYPO 18GX1.5 BLUNT FILL (NEEDLE) IMPLANT
NEEDLE HYPO 25X1 1.5 SAFETY (NEEDLE) ×2 IMPLANT
NS IRRIG 1000ML POUR BTL (IV SOLUTION) ×2 IMPLANT
PACK EENT II TURBAN DRAPE (CUSTOM PROCEDURE TRAY) ×3 IMPLANT
PATTIES SURGICAL .5 X.5 (GAUZE/BANDAGES/DRESSINGS) IMPLANT
PATTIES SURGICAL 1X1 (DISPOSABLE) IMPLANT
PENCIL BUTTON HOLSTER BLD 10FT (ELECTRODE) ×3 IMPLANT
PUMP SYNCHROMED II 40ML RESVR (Neuro Prosthesis/Implant) ×1 IMPLANT
SPONGE SURGIFOAM ABS GEL SZ50 (HEMOSTASIS) IMPLANT
SPONGE T-LAP 4X18 ~~LOC~~+RFID (SPONGE) ×2 IMPLANT
STAPLER VISISTAT 35W (STAPLE) ×3 IMPLANT
SUT BONE WAX W31G (SUTURE) IMPLANT
SUT PROLENE 3 0 PS 2 (SUTURE) ×3 IMPLANT
SUT SILK 0 TIES 10X30 (SUTURE) ×1 IMPLANT
SUT SILK 2 0 PERMA HAND 18 BK (SUTURE) ×2 IMPLANT
SUT SILK 2 0 TIES 10X30 (SUTURE) IMPLANT
SUT VIC AB 0 CT1 18XCR BRD8 (SUTURE) ×2 IMPLANT
SUT VIC AB 0 CT1 8-18 (SUTURE) ×2
SUT VIC AB 2-0 CP2 18 (SUTURE) ×4 IMPLANT
SUT VIC AB 3-0 SH 8-18 (SUTURE) ×7 IMPLANT
SYR 3ML LL SCALE MARK (SYRINGE) IMPLANT
SYR CONTROL 10ML LL (SYRINGE) ×2 IMPLANT
TOWEL GREEN STERILE (TOWEL DISPOSABLE) ×3 IMPLANT
TOWEL GREEN STERILE FF (TOWEL DISPOSABLE) ×2 IMPLANT
TUBE CONNECTING 12X1/4 (SUCTIONS) ×2 IMPLANT
WATER STERILE IRR 1000ML POUR (IV SOLUTION) ×2 IMPLANT

## 2022-01-02 NOTE — Anesthesia Procedure Notes (Signed)
Procedure Name: Intubation Date/Time: 01/02/2022 7:49 AM Performed by: Lowella Dell, CRNA Pre-anesthesia Checklist: Patient identified, Emergency Drugs available, Suction available and Patient being monitored Patient Re-evaluated:Patient Re-evaluated prior to induction Oxygen Delivery Method: Circle System Utilized Preoxygenation: Pre-oxygenation with 100% oxygen Induction Type: IV induction Ventilation: Mask ventilation without difficulty and Oral airway inserted - appropriate to patient size Laryngoscope Size: Mac and 4 Tube type: Oral Tube size: 7.5 mm Number of attempts: 1 Airway Equipment and Method: Stylet Placement Confirmation: ETT inserted through vocal cords under direct vision, positive ETCO2 and breath sounds checked- equal and bilateral Secured at: 23 cm Tube secured with: Tape Dental Injury: Teeth and Oropharynx as per pre-operative assessment

## 2022-01-02 NOTE — Transfer of Care (Signed)
Immediate Anesthesia Transfer of Care Note  Patient: DANFORD TAT  Procedure(s) Performed: Pain pump battery replacement, Left (Left: Abdomen)  Patient Location: PACU  Anesthesia Type:General  Level of Consciousness: drowsy and patient cooperative  Airway & Oxygen Therapy: Patient Spontanous Breathing and Patient connected to face mask oxygen  Post-op Assessment: Report given to RN, Post -op Vital signs reviewed and stable and Patient moving all extremities  Post vital signs: Reviewed and stable  Last Vitals:  Vitals Value Taken Time  BP 113/81 01/02/22 0920  Temp    Pulse 59 01/02/22 0920  Resp 13 01/02/22 0921  SpO2 100 % 01/02/22 0920  Vitals shown include unvalidated device data.  Last Pain:  Vitals:   01/02/22 0603  TempSrc:   PainSc: 4          Complications: No notable events documented.

## 2022-01-02 NOTE — Anesthesia Postprocedure Evaluation (Signed)
Anesthesia Post Note  Patient: Keith [name]  Procedure(s) Performed: Pain pump battery replacement, Left (Left: Abdomen)     Patient location during evaluation: PACU Anesthesia Type: General Level of consciousness: awake and alert Pain management: pain level controlled Vital Signs Assessment: post-procedure vital signs reviewed and stable Respiratory status: spontaneous breathing, nonlabored ventilation, respiratory function stable and patient connected to nasal cannula oxygen Cardiovascular status: blood pressure returned to baseline and stable Postop Assessment: no apparent nausea or vomiting Anesthetic complications: no   No notable events documented.  Last Vitals:  Vitals:   01/02/22 0935 01/02/22 0950  BP: (!) 125/91 132/81  Pulse: 75 61  Resp: 15 10  Temp:  36.6 C  SpO2: 95% 96%    Last Pain:  Vitals:   01/02/22 0950  TempSrc:   PainSc: 4                  Lizzeth Meder P Estus Krakowski

## 2022-01-02 NOTE — Op Note (Signed)
° °  Providing Compassionate, Quality Care - Together  Date of service: 01/02/2022  PREOP DIAGNOSIS:  Chronic pain syndrome, due to renal cell cancer and metastasis Intrathecal morphine pain pump, end of battery life  POSTOP DIAGNOSIS: Same  PROCEDURE: Replacement of left lower quadrant morphine pain pump due to end-of-life device; [employer] SynchroMed 2 (40 mm of 2 mg/mL morphine, same as prior)  SURGEON: Dr. Pieter Partridge C. Arius Harnois, DO  ASSISTANT: Weston Brass, NP  ANESTHESIA: General Endotracheal  EBL: Less than 10 cc  SPECIMENS: None  DRAINS: None  COMPLICATIONS: None  CONDITION: Hemodynamically stable  HISTORY: Keith [name] is a 57 y.o. male with a history of renal cell carcinoma with metastatic disease, chronic pain syndrome due to this, with a Medtronic morphine intrathecal pain pump.  His device was at end-of-life therefore he presented for replacement in refill with a new pain pump.  All risks, benefits and expected outcomes as well as alternatives were discussed and agreed upon.  Informed consent was obtained.  PROCEDURE IN DETAIL: The patient was brought to the operating room. After induction of general anesthesia, the patient was positioned on the operative table in the supine position. All pressure points were meticulously padded. Skin incision was then marked out and prepped and draped in the usual sterile fashion.  Physician driven timeout was performed.  Previous incision was injected with local anesthetic.  Using a 15 blade, incision was made sharply through the dermis.  The pump was identified.  Using Bovie electrocautery this was freed from adhesions and the suture ties were cut.  The catheter and connection was identified and protected.  This was gently extricated from the pocket.  Using syringe, the port was accessed and approximately 0.25 cc of medication was withdrawn from the catheter, this was slow and sluggish but had continuous flow.  The pump was disconnected,  the new pump was filled with 40 mm of 2 mg/mL morphine, same as his prior dosing.  The new pump was connected and the connection was noted to be stable.  The pump was then secured within the pocket with 3-0 Prolene sutures.  The pump fit appropriately in the skin and in pocket appeared that it would be closed without difficulty.  Hemostasis was achieved with monopolar cautery.  Vancomycin powder was placed in the wound.  The wound was then closed in layers, pocket layer was closed with 2-0 Vicryl sutures.  The dermis was closed with 2-0 and 3-0 Vicryl sutures.  Skin was closed with skin glue.  Sterile dressing was applied.  The settings were reset to the previous settings per the [employer] representative.  At the end of the case all sponge, needle, and instrument counts were correct. The patient was then transferred to the stretcher, extubated, and taken to the post-anesthesia care unit in stable hemodynamic condition.

## 2022-01-02 NOTE — H&P (Signed)
Providing Compassionate, Quality Care - Together  NEUROSURGERY HISTORY & PHYSICAL   Keith [name] is an 56 y.o. male.   Chief Complaint: End-of-life pain pump HPI: This is a 56 year old male with a history of renal cell carcinoma with metastatic disease, with a chronic [employer] morphine pump and is intrathecal.  He presents today for pump replacement due to end of battery life.  He has no other complaints at this time.  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   part of right lung removed   Metastasis to bone ([place]) 2014/2016   Pulmonary embolus [facility])     Past Surgical History:  Procedure Laterality Date   BIOPSY  08/28/2019   Procedure: BIOPSY;  Surgeon: Thornton Park, MD;  [facility]: [facility];  Service: Gastroenterology;;   BIOPSY  03/01/2020   Procedure: BIOPSY;  Surgeon: Mauri Pole, MD;  [facility];  Service: Endoscopy;;   BIOPSY  12/19/2020   Procedure: BIOPSY;  Surgeon: Yetta Flock, MD;  [facility];  Service: Gastroenterology;;   BIOPSY  12/20/2020   Procedure: BIOPSY;  Surgeon: Gatha Mayer, MD;  [facility];  Service: Endoscopy;;   COLONOSCOPY W/ POLYPECTOMY     COLONOSCOPY WITH PROPOFOL N/A 03/01/2020   Procedure: COLONOSCOPY WITH PROPOFOL;  Surgeon: Mauri Pole, MD;  [facility];  Service: Endoscopy;  Laterality: N/A;   COLONOSCOPY WITH PROPOFOL N/A 12/20/2020   Procedure: COLONOSCOPY WITH PROPOFOL;  Surgeon: Gatha Mayer, MD;  [facility];  Service: Endoscopy;  Laterality: N/A;   ESOPHAGOGASTRODUODENOSCOPY (EGD) WITH PROPOFOL N/A 08/28/2019   Procedure:  ESOPHAGOGASTRODUODENOSCOPY (EGD) WITH PROPOFOL;  Surgeon: Thornton Park, MD;  [facility];  Service: Gastroenterology;  Laterality: N/A;   ESOPHAGOGASTRODUODENOSCOPY (EGD) WITH PROPOFOL N/A 03/01/2020   Procedure: ESOPHAGOGASTRODUODENOSCOPY (EGD) WITH PROPOFOL;  Surgeon: Mauri Pole, MD;  [facility];  Service: Endoscopy;  Laterality: N/A;   ESOPHAGOGASTRODUODENOSCOPY (EGD) WITH PROPOFOL N/A 12/19/2020   Procedure: ESOPHAGOGASTRODUODENOSCOPY (EGD) WITH PROPOFOL;  Surgeon: Yetta Flock, MD;  [facility];  Service: Gastroenterology;  Laterality: N/A;   FEMUR IM NAIL Right 02/16/2018   Procedure: INTRAMEDULLARY (IM) NAIL RIGHT FEMORAL;  Surgeon: Paralee Cancel, MD;  [facility];  Service: Orthopedics;  Laterality: Right;   FRACTURE SURGERY Left 2022   arm   GIVENS CAPSULE STUDY N/A 03/03/2020   Procedure: GIVENS CAPSULE STUDY;  Surgeon: Mauri Pole, MD;  [facility];  Service: Endoscopy;  Laterality: N/A;   HAND SURGERY Right    HEMOSTASIS CLIP PLACEMENT  12/20/2020   Procedure: HEMOSTASIS CLIP PLACEMENT;  Surgeon: Gatha Mayer, MD;  [facility];  Service: Endoscopy;;   HOT HEMOSTASIS N/A 12/20/2020   Procedure: HOT HEMOSTASIS (ARGON PLASMA COAGULATION/BICAP);  Surgeon: Gatha Mayer, MD;  [facility];  Service: Endoscopy;  Laterality: N/A;   HUMERUS IM NAIL Left 04/04/2021   Procedure: INTRAMEDULLARY (IM) NAIL HUMERUS;  Surgeon: Nicholes Stairs, MD;  Location: [facility];  Service: Orthopedics;  Laterality: Left;  2 hrs Need  Interventional radiology for immobilization of renal cell  tumor- to be done same day prior to [place] LEFT  04/04/2021   IR ANGIOGRAM EXTREMITY RIGHT  02/15/2018   IR ANGIOGRAM SELECTIVE EACH ADDITIONAL VESSEL  02/15/2018   IR ANGIOGRAM SELECTIVE EACH ADDITIONAL VESSEL  02/15/2018   IR ANGIOGRAM SELECTIVE EACH ADDITIONAL VESSEL  02/15/2018   IR  ANGIOGRAM SELECTIVE EACH ADDITIONAL VESSEL  02/15/2018   IR ANGIOGRAM SELECTIVE EACH ADDITIONAL VESSEL  04/04/2021   IR ANGIOGRAM SELECTIVE EACH ADDITIONAL VESSEL  04/04/2021   IR EMBO TUMOR ORGAN ISCHEMIA INFARCT INC GUIDE ROADMAPPING  02/15/2018   IR EMBO TUMOR ORGAN ISCHEMIA INFARCT INC GUIDE ROADMAPPING  02/15/2018   IR EMBO TUMOR ORGAN ISCHEMIA INFARCT INC GUIDE ROADMAPPING  04/04/2021   IR US GUIDE VASC ACCESS LEFT  02/15/2018   IR US GUIDE VASC ACCESS RIGHT  04/04/2021   LUNG REMOVAL, PARTIAL Right 09/21/2008   NEPHRECTOMY RADICAL     Left    PAIN PUMP IMPLANTATION N/A 05/16/2015   Procedure: Intrathecal pain pump placement;  Surgeon: Clydell Hakim, MD;  Location: [facility];  Service: Neurosurgery;  Laterality: N/A;  Intrathecal pain pump placement    Family History  Problem Relation Age of Onset   Hypertension Mother    Diabetes Brother    Irritable bowel syndrome Sister    Cancer Maternal Aunt        ovarian   Colon cancer Neg Hx    Social History:  reports that he has been smoking cigarettes. He has a 30.00 pack-year smoking history. He has never used smokeless tobacco. He reports current drug use. Drug: Marijuana. He reports that he does not drink alcohol.  Allergies:  Allergies  Allergen Reactions   Ceftriaxone Hives    Other reaction(s): red streaks    Hydrocodone Swelling    Medications Prior to Admission  Medication Sig Dispense Refill   ALPRAZolam (XANAX) 1 MG tablet TAKE 1 TABLET BY MOUTH EVERY 8 HOURS AS NEEDED 90 tablet 0   carvedilol (COREG) 12.5 MG tablet TAKE 1 TABLET (12.5 MG TOTAL) BY MOUTH 2 (TWO) TIMES DAILY WITH A MEAL. 180 tablet 3   diphenhydrAMINE (BENADRYL) 25 MG tablet Take 25 mg by mouth daily.     ferrous sulfate 325 (65 FE) MG tablet TAKE 1 TABLET BY MOUTH EVERY DAY WITH BREAKFAST 90 tablet 2   furosemide (LASIX) 20 MG tablet Take 1 tablet (20 mg total) by mouth every other day. 60 tablet 2   gabapentin (NEURONTIN) 300 MG capsule TAKE 1  CAPSULE BY MOUTH THREE TIMES A DAY 270 capsule 3   Glycerin-Hypromellose-PEG 400 ([facility] DRY EYE RELIEF) 0.2-0.2-1 % SOLN Place 1 drop into both eyes daily as needed (Dry eye).     hydrALAZINE (APRESOLINE) 25 MG tablet Take 25 mg by mouth 3 (three) times daily.     HYDROmorphone (DILAUDID) 8 MG tablet Take 8 mg by mouth 3 (three) times daily.     lenvatinib 20 mg daily dose (LENVIMA, 20 MG DAILY DOSE,) 2 x 10 MG capsule Take 2 capsules (20 mg total) by mouth daily. 60 capsule 1   ondansetron (ZOFRAN) 4 MG tablet TAKE ONE TABLET EVERY MORNING. CAN TAKE ONE TABLET LATER IN THE DAY ON AN AS NEEDED BASIS 50 tablet 11   PAIN MANAGEMENT INTRATHECAL, IT, PUMP 1 each by Intrathecal route continuous. Intrathecal (IT) medication:   Morphine.  Can push every 4 hours     pantoprazole (PROTONIX) 40 MG tablet Take 1 tablet (40 mg total) by  mouth daily. 30 tablet 3   tiZANidine (ZANAFLEX) 4 MG tablet Take 4 mg by mouth 3 (three) times daily.     VELTASSA 8.4 g packet Take 1 packet by mouth daily.     Cholecalciferol (VITAMIN D3) 125 MCG (5000 UT) CAPS Take 5,000 Units by mouth daily.     Cyanocobalamin (B-12) 1000 MCG TABS Take 1 tablet (1,000 mcg total) by mouth daily. 30 tablet    Magnesium Oxide 400 MG CAPS Take 1 capsule (400 mg total) by mouth daily. 7 capsule 0    Results for orders placed or performed during the hospital encounter of 12/31/21 (from the past 48 hour(s))  Basic metabolic panel per protocol     Status: Abnormal   Collection Time: 12/31/21  8:27 AM  Result Value Ref Range   Sodium 135 135 - 145 mmol/L   Potassium 4.8 3.5 - 5.1 mmol/L   Chloride 99 98 - 111 mmol/L   CO2 28 22 - 32 mmol/L   Glucose, Bld 107 (H) 70 - 99 mg/dL    Comment: Glucose reference range applies only to samples taken after fasting for at least 8 hours.   BUN 17 6 - 20 mg/dL   Creatinine, Ser 1.45 (H) 0.61 - 1.24 mg/dL   Calcium 9.6 8.9 - 10.3 mg/dL   GFR, Estimated 57 (L) >60 mL/min    Comment: (NOTE) Calculated  using the CKD-EPI Creatinine Equation (2021)    Anion gap 8 5 - 15    Comment: Performed at [facility] [address] [place], [place] [address]  CBC per protocol     Status: Abnormal   Collection Time: 12/31/21  8:27 AM  Result Value Ref Range   WBC 6.6 4.0 - 10.5 K/uL   RBC 4.52 4.22 - 5.81 MIL/uL   Hemoglobin 13.8 13.0 - 17.0 g/dL   HCT 45.1 39.0 - 52.0 %   MCV 99.8 80.0 - 100.0 fL   MCH 30.5 26.0 - 34.0 pg   MCHC 30.6 30.0 - 36.0 g/dL   RDW 16.6 (H) 11.5 - 15.5 %   Platelets 160 150 - 400 K/uL   nRBC 0.0 0.0 - 0.2 %    Comment: Performed at [facility], [place] [address] [place], [place] [address]  SARS CORONAVIRUS 2 (TAT 6-24 HRS) Nasopharyngeal Nasopharyngeal Swab     Status: None   Collection Time: 12/31/21  8:29 AM   Specimen: Nasopharyngeal Swab  Result Value Ref Range   SARS Coronavirus 2 NEGATIVE NEGATIVE    Comment: (NOTE) SARS-CoV-2 target nucleic acids are NOT DETECTED.  The SARS-CoV-2 RNA is generally detectable in upper and lower respiratory specimens during the acute phase of infection. Negative results do not preclude SARS-CoV-2 infection, do not rule out co-infections with other pathogens, and should not be used as the sole basis for treatment or other patient management decisions. Negative results must be combined with clinical observations, patient history, and epidemiological information. The expected result is Negative.  Fact Sheet for Patients: SugarRoll.be  Fact Sheet for Healthcare Providers: https://www.woods-mathews.com/  This test is not yet approved or cleared by the [place] FDA and  has been authorized for detection and/or diagnosis of SARS-CoV-2 by FDA under an Emergency Use Authorization (EUA). This EUA will remain  in effect (meaning this test can be used) for the duration of the COVID-19 declaration under Se ction 564(b)(1) of the Act, 21 U.S.C. section 360bbb-3(b)(1), unless  the authorization is terminated or revoked sooner.  Performed at [facility], [place] [address] [place], [place] [address]    *Note: Due to a large number of results and/or encounters for the requested time period, some results have not been displayed. A complete set of results can be found in Results Review.   No results found.  ROS All positives and negatives are listed in HPI above  Blood pressure (!) 150/99, pulse 72, temperature 97.7 F (36.5 C), temperature source Oral, resp. rate 17, SpO2 97 %. Physical Exam  Awake alert oriented x3 PERRLA EOMI Moves all extremities equally Sensory intact light touch throughout  speech fluent and appropriate Left lower quadrant pump noted   Assessment/Plan 56 year old male with  Chronic pain syndrome, end of battery life of pain pump   -OR today for removal and replacement of morphine pain pump.  All risks, benefits and expected outcomes were discussed and agreed upon with the patient.  He agrees to proceed with surgical intervention, informed consent was obtained.   Thank you for allowing me to participate in this patient's care.  Please do not hesitate to call with questions or concerns.   Elwin Sleight, [facility] Cell: [phone]

## 2022-01-03 ENCOUNTER — Encounter (HOSPITAL_COMMUNITY): Payer: Self-pay | Admitting: Neurological Surgery

## 2022-01-03 IMAGING — NM NM GI BLOOD LOSS
2 series · 12 of 12 positions shown · non-contrast
Comparison: None.

CLINICAL DATA: GI bleeding

EXAM:
NUCLEAR MEDICINE GASTROINTESTINAL BLEEDING SCAN
TECHNIQUE: Sequential abdominal images were obtained following intravenous
administration of Mc-WWm labeled red blood cells.
RADIOPHARMACEUTICALS:  21.9 mCi Mc-WWm pertechnetate in-vitro
labeled red cells.

[Series 1: gi bleed · 4.14mm/px · 6 of 60 frames shown (1 of 2)]
[frame 6/60]
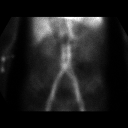
[frame 16/60]
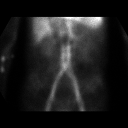
[frame 26/60]
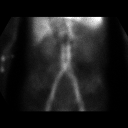
[frame 36/60]
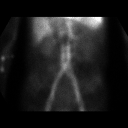
[frame 46/60]
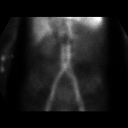
[frame 56/60]
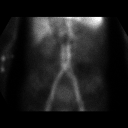

[Series 2: gi bleed · 4.14mm/px · 6 of 60 frames shown (2 of 2)]
[frame 6/60]
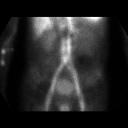
[frame 16/60]
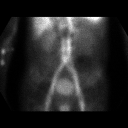
[frame 26/60]
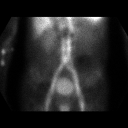
[frame 36/60]
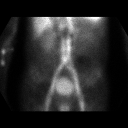
[frame 46/60]
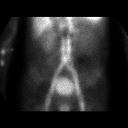
[frame 56/60]
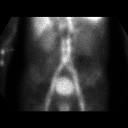

[12 of 12 positions shown; findings below may reference images not displayed]

FINDINGS: Adequate uptake of radiotracer is noted within the spleen and liver
as well as the blood pool. Normal excretion is noted bilaterally. No
focal area of increased uptake to suggest active hemorrhage is
noted.
IMPRESSION: No evidence of active hemorrhage.

## 2022-01-04 IMAGING — CT CT CTA ABD/PEL W/CM AND/OR W/O CM
2 of 12 series · 11 of 46 positions shown, 17 images · IV contrast (OMNIPAQUE)
Comparison: Nuclear medicine bleeding study 03/01/2020, CT
12/20/2019

CLINICAL DATA: 53-year-old male with a history of possible GI
bleeding

EXAM:
CTA ABDOMEN AND PELVIS WITHOUT AND WITH CONTRAST
TECHNIQUE: Multidetector CT imaging of the abdomen and pelvis was performed
using the standard protocol during bolus administration of
intravenous contrast. Multiplanar reconstructed images and MIPs were
obtained and reviewed to evaluate the vascular anatomy.
CONTRAST:  100mL OMNIPAQUE IOHEXOL 350 MG/ML SOLN

[Series 7: arterial (person_name) · axial · arterial · 0.75mm/px · z∈[-478,-78]mm · 10 of 232 slices shown, 15 images]
[im 16/232  soft-tissue]
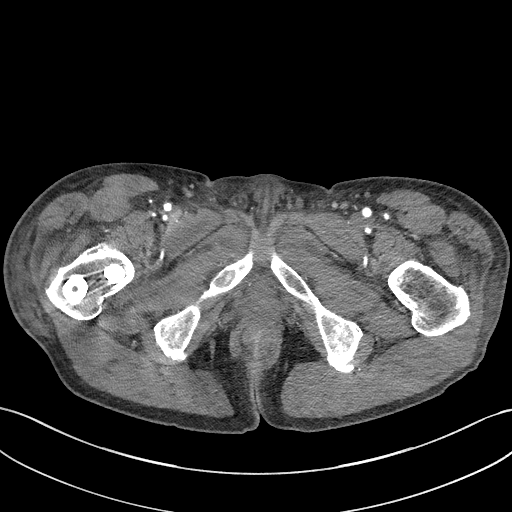
[im 16/232  bone]
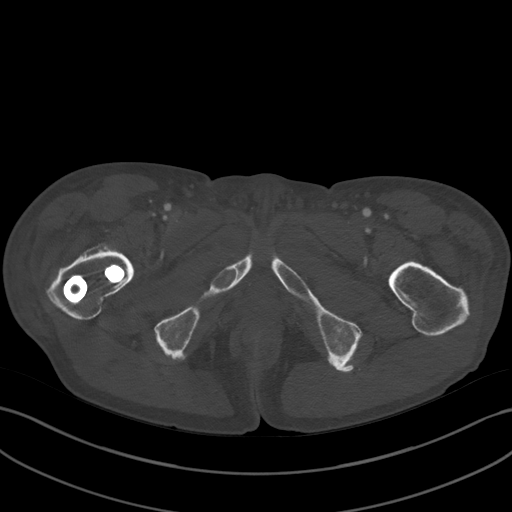
[im 47/232  soft-tissue]
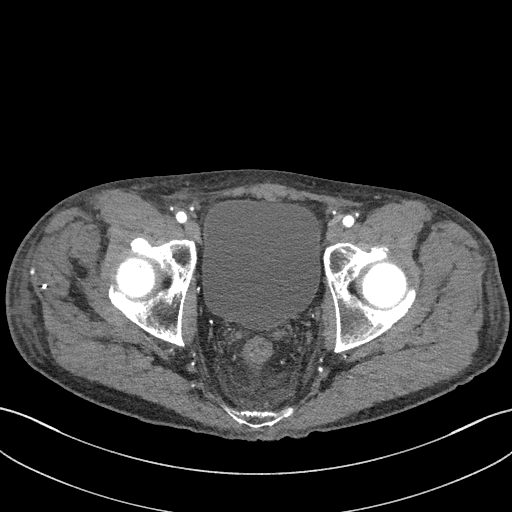
[im 62/232  soft-tissue]
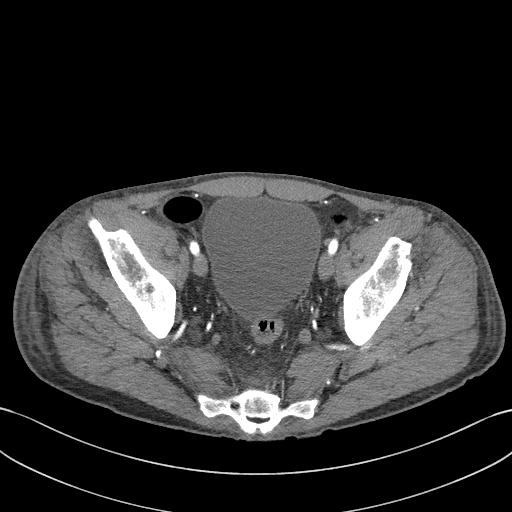
[im 93/232  soft-tissue]
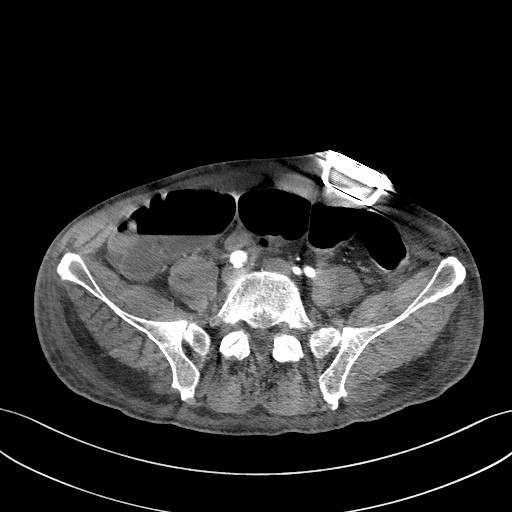
[im 124/232  soft-tissue]
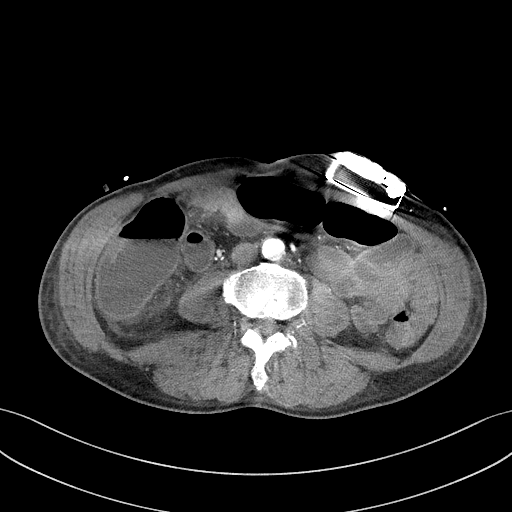
[im 139/232  soft-tissue]
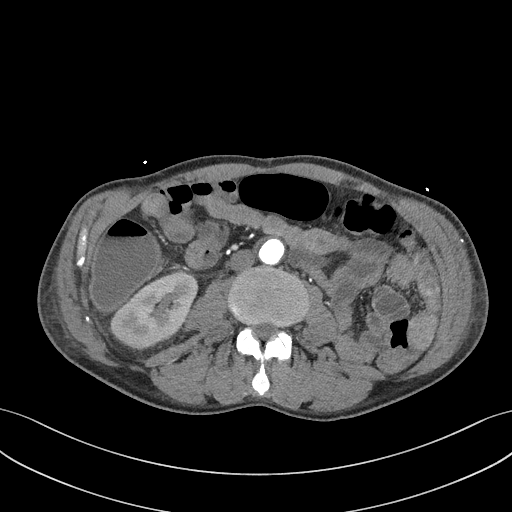
[im 170/232  soft-tissue]
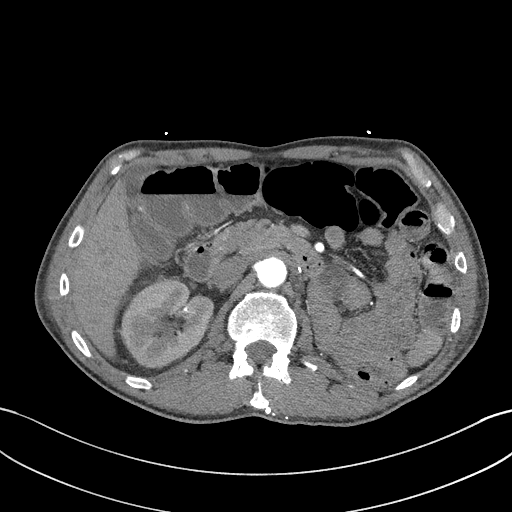
[im 170/232  lung]
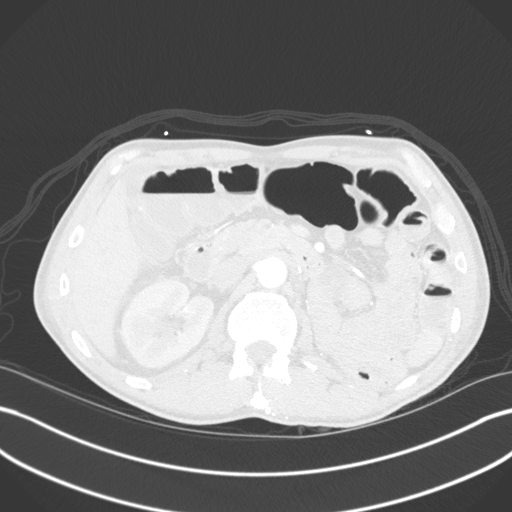
[im 185/232  soft-tissue]
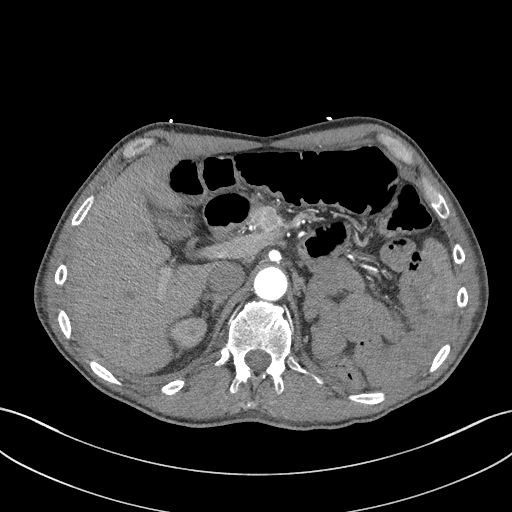
[im 185/232  lung]
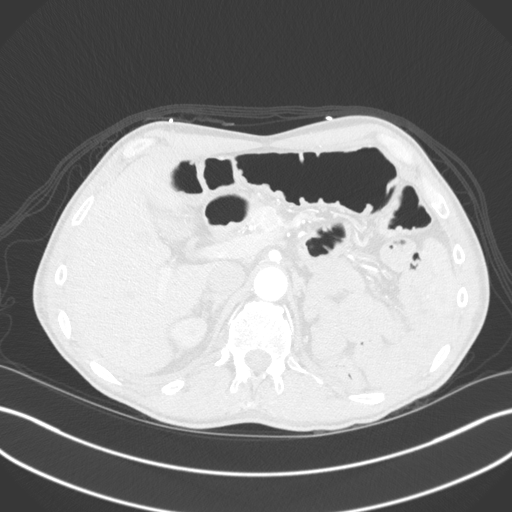
[im 201/232  lung]
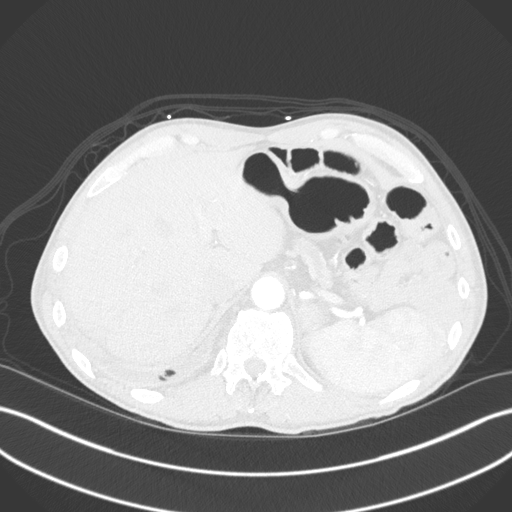
[im 216/232  soft-tissue]
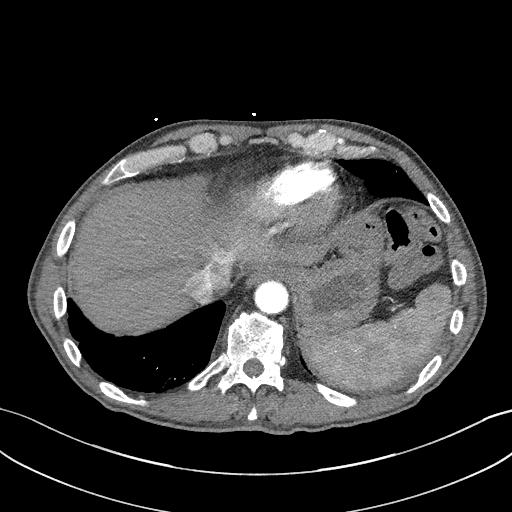
[im 216/232  lung]
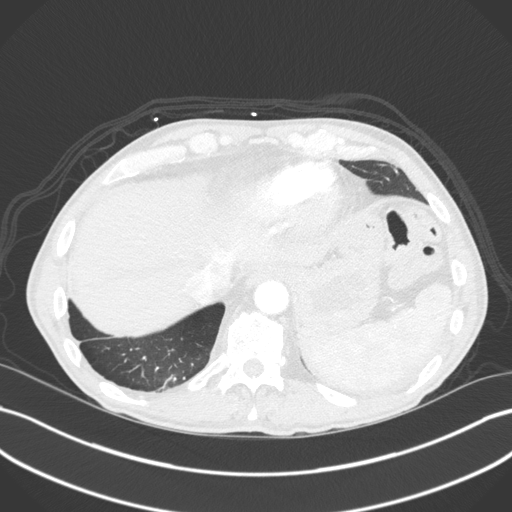
[im 216/232  bone]
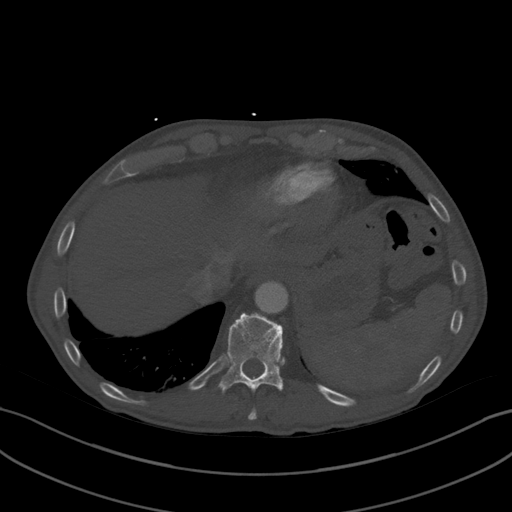

[Series 17: coronal art · coronal · 0.76mm/px · 1 of 120 slices shown, 2 images]
[im 60/120  soft-tissue]
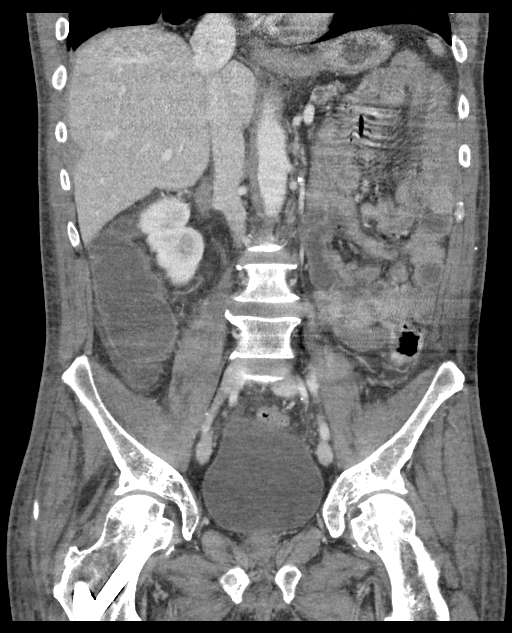
[im 60/120  bone]
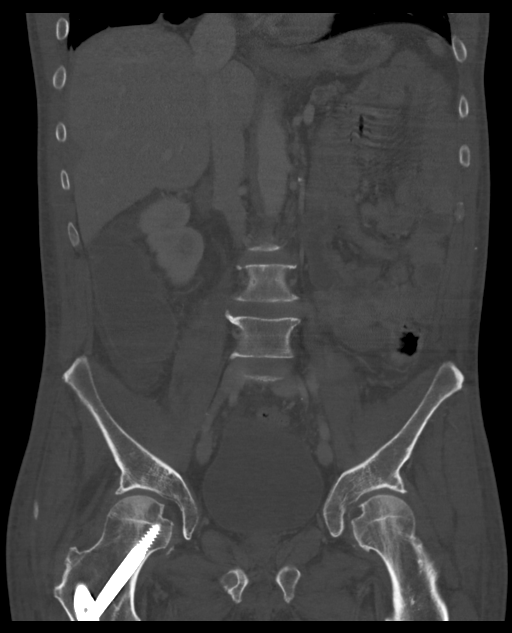

[11 of 46 positions shown; findings below may reference images not displayed]

FINDINGS: VASCULAR

Aorta: Minimal atherosclerosis of the abdominal aorta. Normal course
caliber and contour with no dissection, or aneurysm. There is
shallow irregular plaque/ulcerated plaque with no inflammatory
changes just above the IMA origin. No diameter change of the aorta.

Celiac: Patent, with no significant atherosclerotic changes.

SMA: Patent, with no significant atherosclerotic changes.

Renals:

Single right renal artery.  No significant atherosclerotic changes.

Surgical changes of left renal artery ligation.

IMA: IMA patent.

Right lower extremity:

Unremarkable course, caliber, and contour of the right iliac system.
Mild atherosclerosis. No aneurysm, dissection, or occlusion.
Hypogastric artery is patent. Common femoral artery patent. Proximal
SFA and profunda femoris patent.

Left lower extremity:

Unremarkable course, caliber, and contour of the left iliac system.
Mild atherosclerosis. No aneurysm, dissection, or occlusion.
Hypogastric artery is patent. Common femoral artery patent. Proximal
SFA and profunda femoris patent.

Veins: Unremarkable appearance of the venous system.

Review of the MIP images confirms the above findings.

NON-VASCULAR

Lower chest: No acute.

Hepatobiliary: Unremarkable appearance of the liver. Unremarkable
gall bladder.

Pancreas: Redemonstration of hypervascular masses involving the
pancreatic head and the head/neck junction. The largest at the
junction is essentially unchanged measuring 2.5 cm. This does have
circumferential involvement of the local gastroduodenal artery and
gastroepiploic artery, as well as likely the superior
pancreaticoduodenal arcade.

Similar degree duct dilation of the body and tail with developing
atrophy distally. No local inflammatory changes.

Spleen: Unremarkable.

Adrenals/Urinary Tract: Unremarkable appearance of adrenal glands.

Right:

No hydronephrosis. Symmetric perfusion to the left. No
nephrolithiasis. Unremarkable course of the right ureter.

Left:

Left nephrectomy

Unilateral ir partially distended.

Stomach/Bowel: Unremarkable appearance of the stomach. Unremarkable
appearance of small bowel. No evidence of obstruction. Normal
appendix. Fluid-filled proximal colon. No focal inflammatory changes
or wall thickening. No extravasation of contrast. No pooling or
puddling of contrast identified.

Lymphatic: No adenopathy.

Mesenteric: No intraperitoneal free fluid or hemorrhage. No
mesenteric adenopathy.

Reproductive: Unremarkable appearance of the pelvic organs.

Other: Generator on the left anterior abdominal wall with lead
terminating within the spinal canal out of the field of view.

Musculoskeletal: No acute displaced fracture.  Degenerative changes.

Lucent lesion involving the right aspect of the sacrum is relatively
unchanged over time dating to the CT 07/07/2018. Lucent lesion with
sclerotic border in the inferior pubic ramus on the right,
unchanged.

Surgical changes of proximal right femur.
IMPRESSION: No acute arterial abnormality, with no evidence of gastrointestinal
hemorrhage.

Aortic Atherosclerosis (KR2BO-CEX.X).

Unchanged appearance of hypervascular pancreatic lesions.

Additional ancillary findings as above.

## 2022-01-06 ENCOUNTER — Other Ambulatory Visit (HOSPITAL_COMMUNITY): Payer: Self-pay

## 2022-01-06 ENCOUNTER — Other Ambulatory Visit: Payer: Self-pay | Admitting: Oncology

## 2022-01-07 ENCOUNTER — Encounter: Payer: Self-pay | Admitting: Oncology

## 2022-01-07 ENCOUNTER — Other Ambulatory Visit (HOSPITAL_COMMUNITY): Payer: Self-pay

## 2022-01-07 IMAGING — DX DG ABDOMEN 1V
1 series · 1 of 1 positions shown · non-contrast
Comparison: CT from 3 days ago

CLINICAL DATA: Small bowel obstruction

EXAM:
ABDOMEN - 1 VIEW

[abdomen kub]
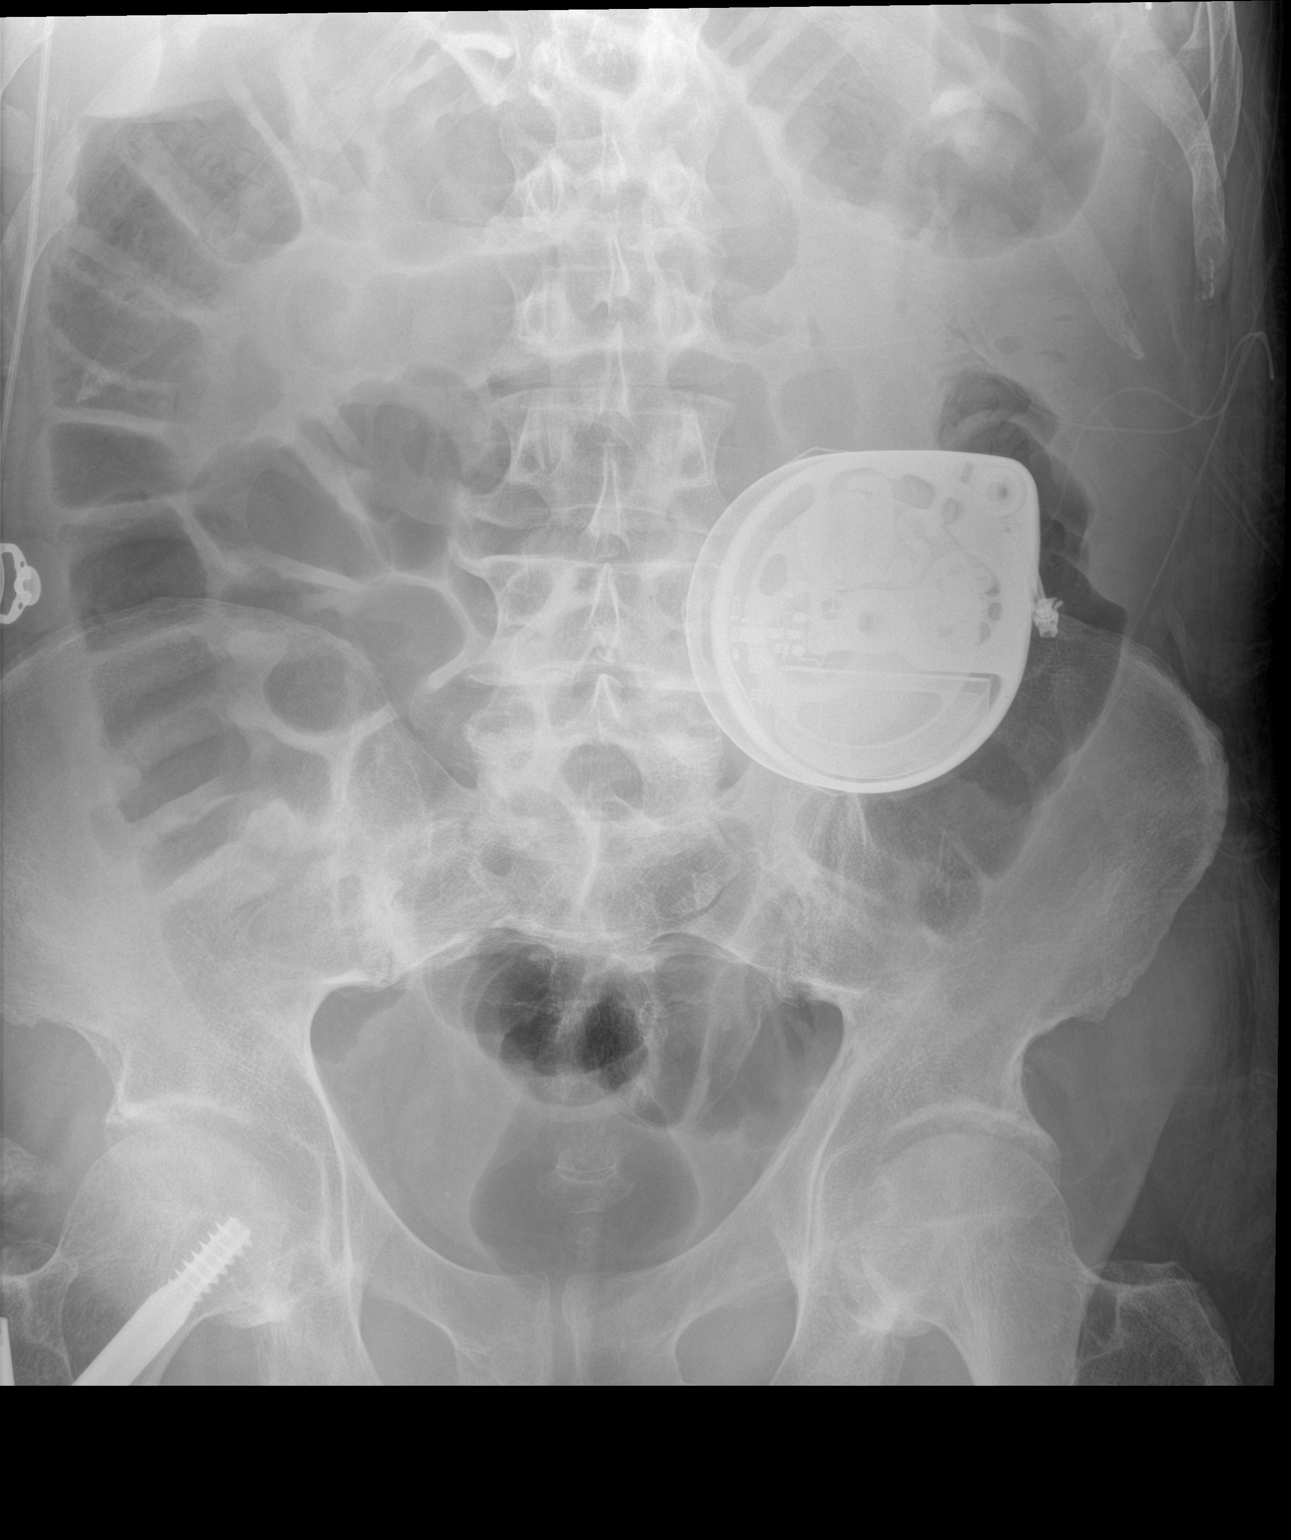

[1 of 1 positions shown; findings below may reference images not displayed]

FINDINGS: Nonobstructive bowel gas pattern. Gas is seen throughout the colon.
Left flank pump and postoperative right femur. No concerning mass
effect or gas collection.
IMPRESSION: Nonobstructive bowel gas pattern.

## 2022-01-07 MED ORDER — LENVIMA (20 MG DAILY DOSE) 2 X 10 MG PO CPPK
20.0000 mg | ORAL_CAPSULE | Freq: Every day | ORAL | 1 refills | Status: DC
  Filled 2022-01-07: qty 60, 30d supply, fill #0
  Filled 2022-02-05: qty 60, 30d supply, fill #1

## 2022-01-10 ENCOUNTER — Inpatient Hospital Stay: Payer: Medicare Other | Admitting: Oncology

## 2022-01-10 ENCOUNTER — Inpatient Hospital Stay: Payer: Medicare Other

## 2022-01-10 ENCOUNTER — Inpatient Hospital Stay: Payer: Medicare Other | Attending: Oncology

## 2022-01-10 ENCOUNTER — Other Ambulatory Visit: Payer: Self-pay

## 2022-01-10 VITALS — BP 142/72 | HR 70 | Temp 97.9°F | Resp 17 | Ht 75.0 in | Wt 172.7 lb

## 2022-01-10 DIAGNOSIS — C642 Malignant neoplasm of left kidney, except renal pelvis: Secondary | ICD-10-CM | POA: Diagnosis not present

## 2022-01-10 DIAGNOSIS — C7951 Secondary malignant neoplasm of bone: Secondary | ICD-10-CM | POA: Diagnosis not present

## 2022-01-10 DIAGNOSIS — Z5112 Encounter for antineoplastic immunotherapy: Secondary | ICD-10-CM | POA: Diagnosis not present

## 2022-01-10 DIAGNOSIS — C649 Malignant neoplasm of unspecified kidney, except renal pelvis: Secondary | ICD-10-CM | POA: Diagnosis not present

## 2022-01-10 DIAGNOSIS — Z79899 Other long term (current) drug therapy: Secondary | ICD-10-CM | POA: Diagnosis not present

## 2022-01-10 LAB — CBC WITH DIFFERENTIAL (CANCER CENTER ONLY)
Abs Immature Granulocytes: 0.01 10*3/uL (ref 0.00–0.07)
Basophils Absolute: 0.1 10*3/uL (ref 0.0–0.1)
Basophils Relative: 1 %
Eosinophils Absolute: 0.4 10*3/uL (ref 0.0–0.5)
Eosinophils Relative: 6 %
HCT: 40.2 % (ref 39.0–52.0)
Hemoglobin: 12.5 g/dL — ABNORMAL LOW (ref 13.0–17.0)
Immature Granulocytes: 0 %
Lymphocytes Relative: 18 %
Lymphs Abs: 1.1 10*3/uL (ref 0.7–4.0)
MCH: 30.6 pg (ref 26.0–34.0)
MCHC: 31.1 g/dL (ref 30.0–36.0)
MCV: 98.5 fL (ref 80.0–100.0)
Monocytes Absolute: 0.6 10*3/uL (ref 0.1–1.0)
Monocytes Relative: 10 %
Neutro Abs: 3.9 10*3/uL (ref 1.7–7.7)
Neutrophils Relative %: 65 %
Platelet Count: 115 10*3/uL — ABNORMAL LOW (ref 150–400)
RBC: 4.08 MIL/uL — ABNORMAL LOW (ref 4.22–5.81)
RDW: 17.1 % — ABNORMAL HIGH (ref 11.5–15.5)
WBC Count: 6 10*3/uL (ref 4.0–10.5)
nRBC: 0 % (ref 0.0–0.2)

## 2022-01-10 LAB — CMP (CANCER CENTER ONLY)
ALT: 11 U/L (ref 0–44)
AST: 14 U/L — ABNORMAL LOW (ref 15–41)
Albumin: 3.3 g/dL — ABNORMAL LOW (ref 3.5–5.0)
Alkaline Phosphatase: 80 U/L (ref 38–126)
Anion gap: 6 (ref 5–15)
BUN: 19 mg/dL (ref 6–20)
CO2: 27 mmol/L (ref 22–32)
Calcium: 9.1 mg/dL (ref 8.9–10.3)
Chloride: 102 mmol/L (ref 98–111)
Creatinine: 1.48 mg/dL — ABNORMAL HIGH (ref 0.61–1.24)
GFR, Estimated: 56 mL/min — ABNORMAL LOW (ref >60)
Glucose, Bld: 120 mg/dL — ABNORMAL HIGH (ref 70–99)
Potassium: 4.2 mmol/L (ref 3.5–5.1)
Sodium: 135 mmol/L (ref 135–145)
Total Bilirubin: 0.4 mg/dL (ref 0.3–1.2)
Total Protein: 7.3 g/dL (ref 6.5–8.1)

## 2022-01-10 IMAGING — MR MR [PERSON_NAME] LOW WO/W CM*L*
8 of 10 series · 34 of 40 positions shown · IV contrast (gadavist)
Comparison: MRI 09/12/2015

CLINICAL DATA: History of metastatic renal cell carcinoma.
Bilateral lower leg pain

EXAM:
MRI OF LOWER LEFT EXTREMITY WITHOUT AND WITH CONTRAST
TECHNIQUE: Multiplanar, multisequence MR imaging of the left tibia and fibula
was performed both before and after administration of intravenous
contrast.
CONTRAST:  7mL GADAVIST GADOBUTROL 1 MMOL/ML IV SOLN

[Series 3: T1 · coronal · left · 4.0mm · 1.95mm/px · 2 of 28 slices shown (1 of 2)]
[im 1/28]
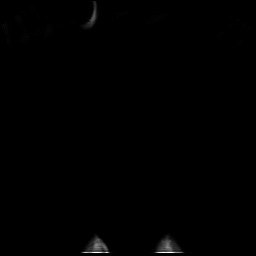
[im 28/28]
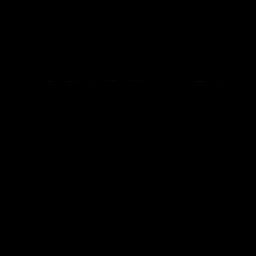

[Series 4: STIR · coronal · left · 4.0mm · 1.95mm/px · 2 of 28 slices shown (1 of 2)]
[im 1/28]
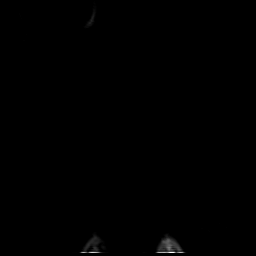
[im 28/28]
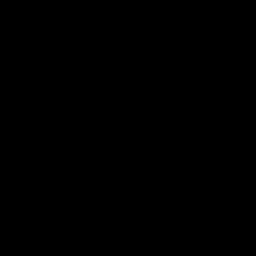

[Series 5: T1 · axial · left · 6.0mm · 0.98mm/px · z∈[-255,+180]mm · 6 of 59 slices shown (2 of 2)]
[im 1/59]
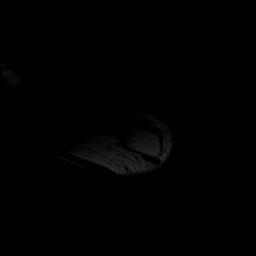
[im 12/59]
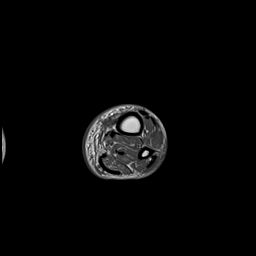
[im 24/59]
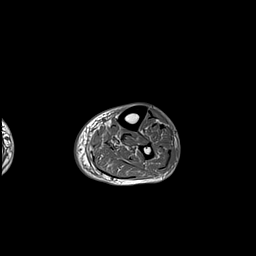
[im 35/59]
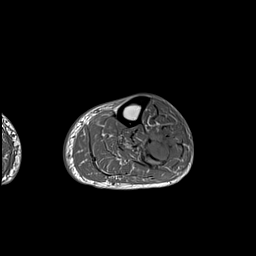
[im 47/59]
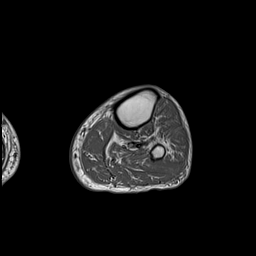
[im 59/59]
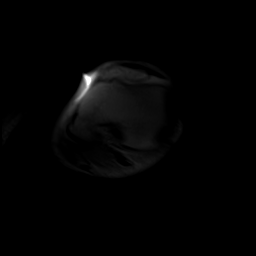

[Series 6: T2 fat-sat · axial · left · 6.0mm · 1.02mm/px · z∈[-255,+180]mm · 6 of 59 slices shown]
[im 1/59]
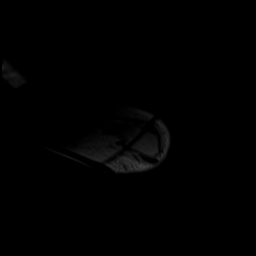
[im 12/59]
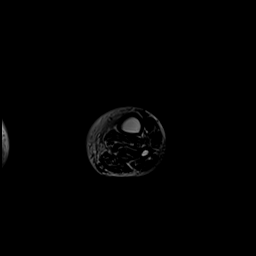
[im 24/59]
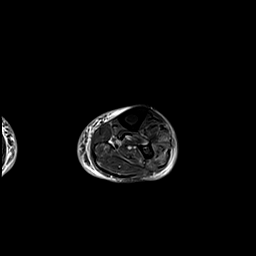
[im 35/59]
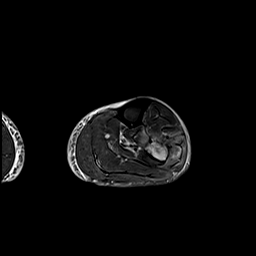
[im 47/59]
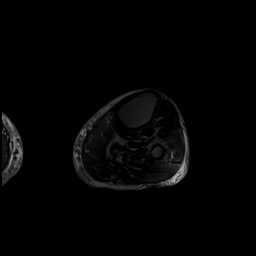
[im 59/59]
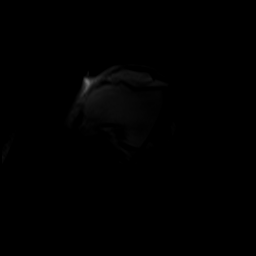

[Series 7: STIR · sagittal · left · 4.0mm · 1.72mm/px · 3 of 30 slices shown (2 of 2)]
[im 1/30]
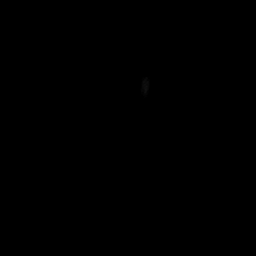
[im 15/30]
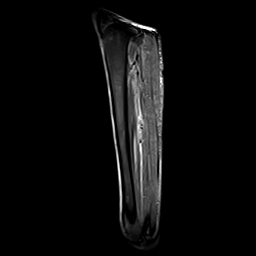
[im 30/30]
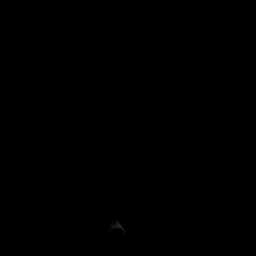

[Series 8: pre axial ti · axial · non-contrast · left · 6.0mm · 0.49mm/px · z∈[-255,+180]mm · 6 of 59 slices shown]
[im 1/59]
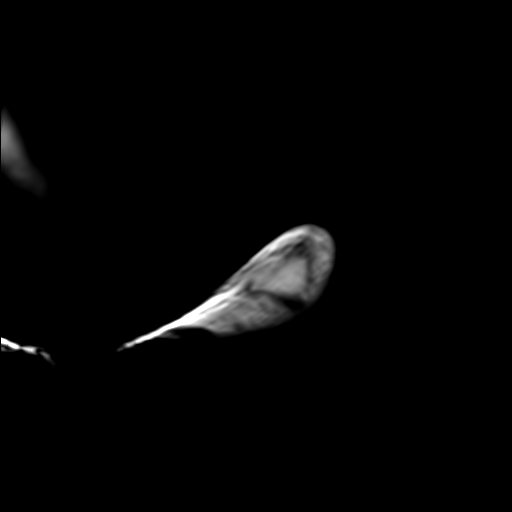
[im 12/59]
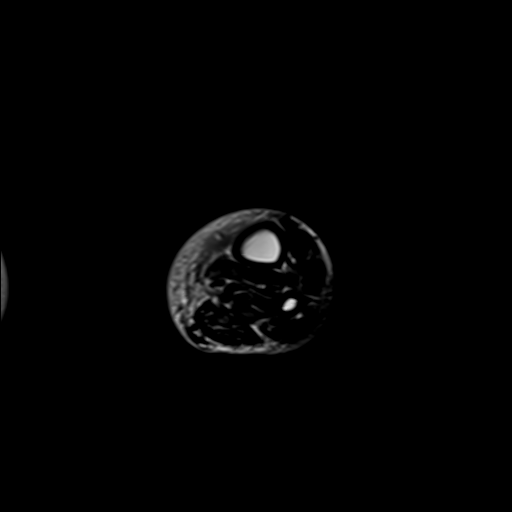
[im 24/59]
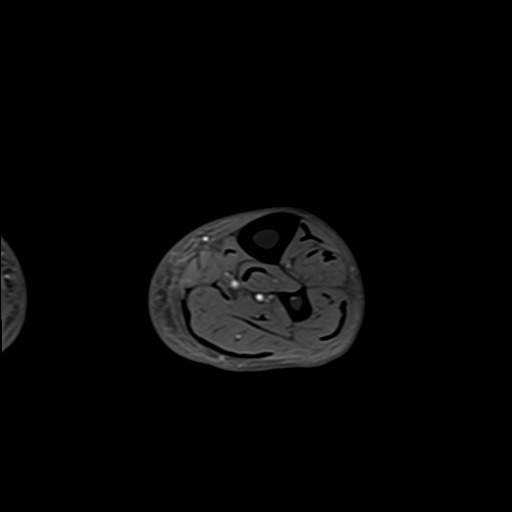
[im 35/59]
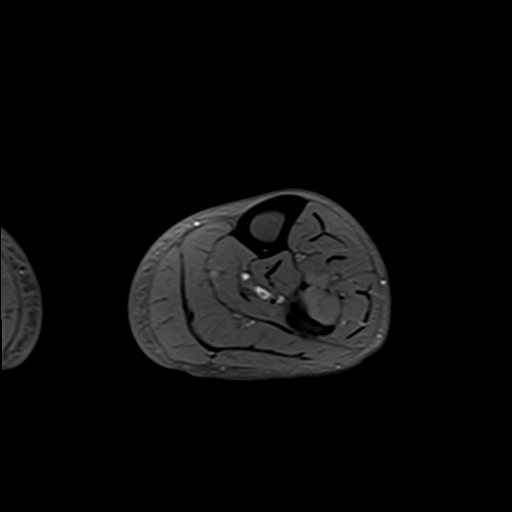
[im 47/59]
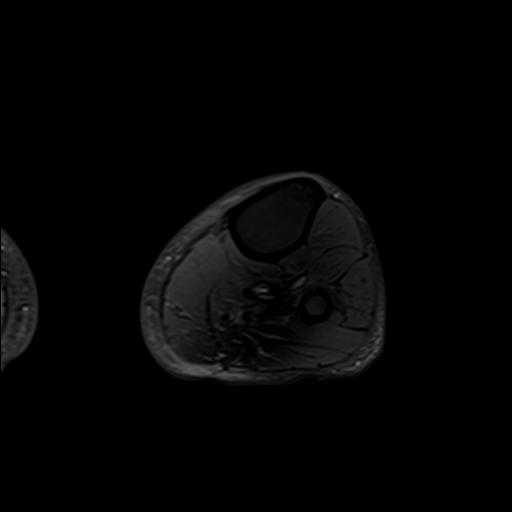
[im 59/59]
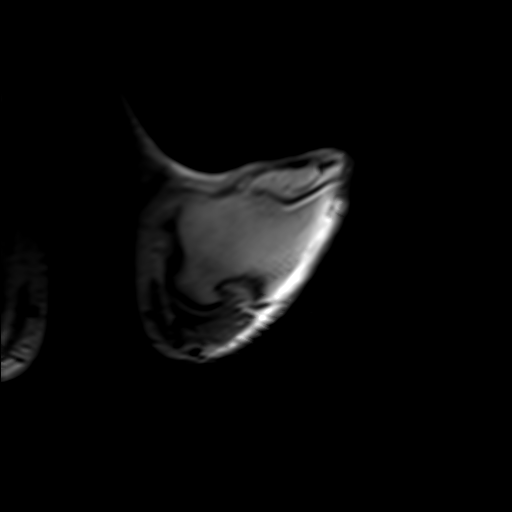

[Series 9: post axial ti · axial · left · 6.0mm · 0.49mm/px · z∈[-255,+180]mm · 6 of 58 slices shown]
[im 1/58]
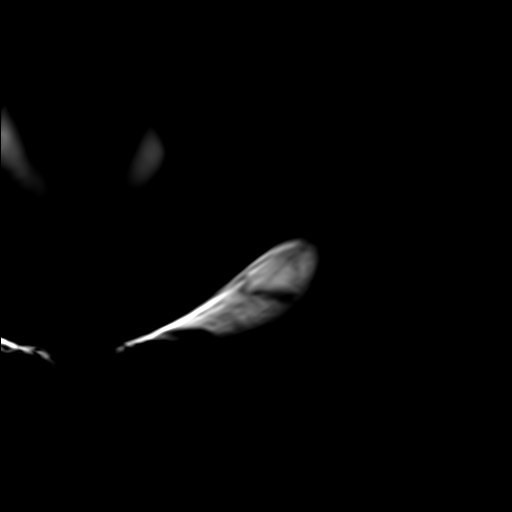
[im 12/58]
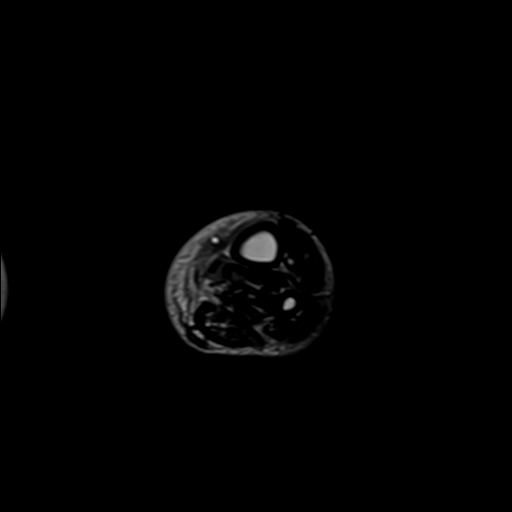
[im 23/58]
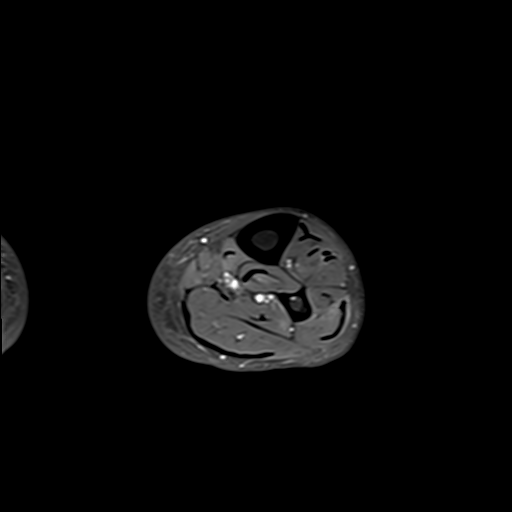
[im 35/58]
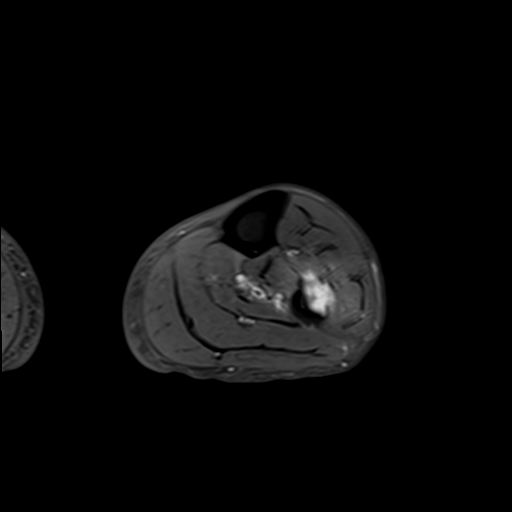
[im 46/58]
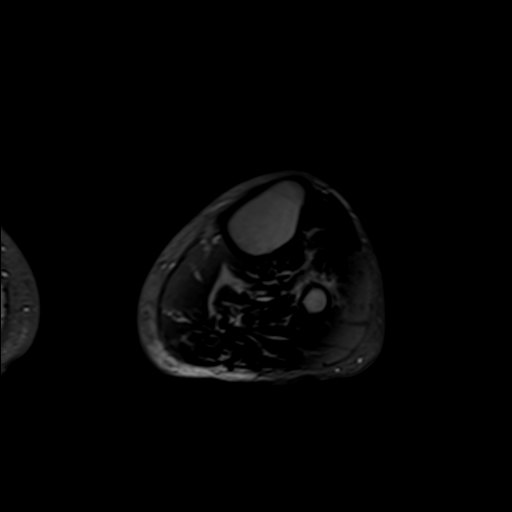
[im 58/58]
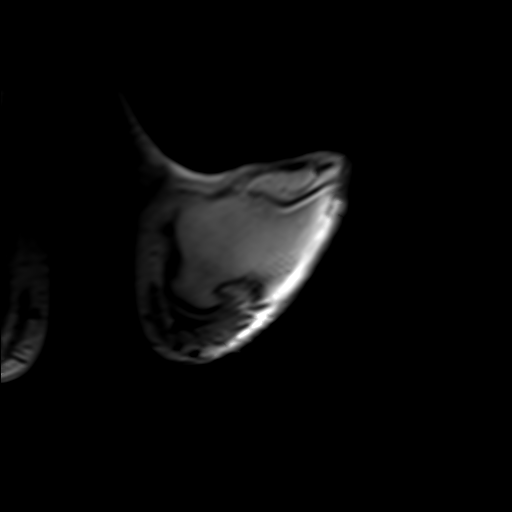

[Series 11: T1 fat-sat · coronal · left · 4.0mm · 2.60mm/px · 3 of 28 slices shown]
[im 1/28]
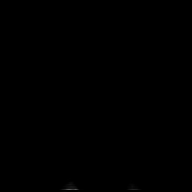
[im 14/28]
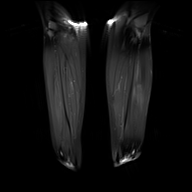
[im 28/28]
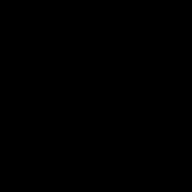

[34 of 40 positions shown; findings below may reference images not displayed]

FINDINGS: Expansile left fibular diaphyseal mass measuring 2.7 cm AP x 3.6 cm
TR x 5.4 cm CC (series 4, image 11; series 9, image 27). Mass
previously measured 3.4 x 3.6 x 5.7 cm on 09/12/2015. Mass is
heterogeneously T2 hyperintense with avid postcontrast enhancement.
There is expansion and bony destruction of the underlying fibula.
Invasion of the adjacent lower leg musculature including within the
peroneus longus, extensor hallucis longus, and tibialis posterior
muscles with associated intramuscular edema.

There is a new rounded T2 hyperintense marrow replacing lesion
within the proximal fibular metadiaphysis measuring 1.4 x 1.0 cm
(series 7, image 20; series 4, image 11). Lesion is minimally
expansile without cortical breakthrough or extraosseous soft tissue
component.

Proximal right tibial metaphyseal mass with the main component of
the mass measuring 6.2 x 5.1 cm (series 4, image 20). This component
previously measured 4.8 x 4.6 cm. There is new extension of tumor
within the medullary space into the proximal metadiaphysis of the
right tibia extending 4.0 cm below the original lesion (series 4,
image 21). Suspect a small component of extraosseous tumor extension
at the anteromedial aspect of the proximal metaphysis (series 3,
image 23). No additional lesions within the remaining visualized
right tibia and right fibula.
IMPRESSION: 1. Avidly enhancing 5.4 cm metastatic left fibular diaphyseal mass,
slightly decreased in size compared to prior MRI 09/12/2015.
[DATE]. New 1.4 x 1.0 cm marrow replacing lesion within the proximal
right fibular metadiaphysis consistent with metastatic disease.
3. Enlarging right proximal tibial metaphyseal mass with new
extension of tumor into the proximal metadiaphysis of the right
tibia.

These results will be called to the ordering clinician or
representative by the Radiologist Assistant, and communication
documented in the PACS or [REDACTED].

## 2022-01-10 MED ORDER — ZOLEDRONIC ACID 4 MG/100ML IV SOLN
4.0000 mg | Freq: Once | INTRAVENOUS | Status: AC
  Administered 2022-01-10: 4 mg via INTRAVENOUS
  Filled 2022-01-10: qty 100

## 2022-01-10 MED ORDER — SODIUM CHLORIDE 0.9 % IV SOLN
400.0000 mg | Freq: Once | INTRAVENOUS | Status: AC
  Administered 2022-01-10: 400 mg via INTRAVENOUS
  Filled 2022-01-10: qty 16

## 2022-01-10 MED ORDER — SODIUM CHLORIDE 0.9 % IV SOLN: Freq: Once | INTRAVENOUS | Status: AC

## 2022-01-10 NOTE — Progress Notes (Signed)
Hematology and Oncology Follow Up   Keith [name] [id number] 10/29/1966 56 y.o. 01/10/2022 8:43 AM [name], MDPharr, [name], MD       Principle Diagnosis: 56 year old man with kidney cancer diagnosed in 2009.  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  Prior Therapy: Status post laparoscopic radical nephrectomy.  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  Patient status post thoracotomy for a synchronous metastatic lung lesions done October 2009.   Patient is status post stereotactic radiotherapy to pulmonary nodules in May of 2010. He is S/P Sutent 50 mg 4 weeks on 2 weeks off from 10/2010 to 03/2013. He progressed at that time.  He is S/P radiation to the right sacral bone between 4/22 to 4/30.  He is S/P XRT to the left shoulder 03/20/14 to 03/31/14. Votrient 800 mg by mouth daily from 03/2013 through 06/22/2015. Discontinued secondary to disease progression. Nivolumab 3 mg/kg given every 2 weeks started on 06/29/2015. He is status post 4 cycles completed 08/10/2015. He developed disease progression in September 2016.  Status post radiation therapy to the left mid fibula completed on 11/14/2015. He received a grade 1 fraction. He is status post radiation to the proximal and distal femur over 2 weeks and 10 fractions of total of 30 Gy.  This was completed in March 2019. 11.   Radiation therapy to the right tibia.  Last treatment completed in September 2019. 12. Cabometyx 60 mg daily started in November 2016.  Therapy discontinued and February 2022 due to progression of disease. 13.  Axitinib 5 mg twice a day started in March 2022.  Therapy discontinued in June 2022 due to progression of disease.   Current therapy: Pembrolizumab 200 mg every 3 weeks with lenvatinib 20 mg daily started on May 26, 2021.  He received 400 mg after cycle 5 and will be repeated every 6 weeks.  He is here for cycle 8 of  therapy.  Interim History: Keith [name] returns today for a follow-up visit.  Since the last visit, he has a pain pump has been recently replaced and currently recovering from this procedure.  He continues to tolerate Lenvima with Pembrolizumab without any major complications.  He denies any nausea, vomiting or abdominal pain.  He denies any recent hospitalizations or illnesses.  He denies any worsening bone pain or pathological fractures.   Medications: Reviewed without changes. Current Outpatient Medications  Medication Sig Dispense Refill   ALPRAZolam (XANAX) 1 MG tablet TAKE 1 TABLET BY MOUTH EVERY 8 HOURS AS NEEDED 90 tablet 0   carvedilol (COREG) 12.5 MG tablet TAKE 1 TABLET (12.5 MG TOTAL) BY MOUTH 2 (TWO) TIMES DAILY WITH A MEAL. 180 tablet 3   Cholecalciferol (VITAMIN D3) 125 MCG (5000 UT) CAPS Take 5,000 Units by mouth daily.     Cyanocobalamin (B-12) 1000 MCG TABS Take 1 tablet (1,000 mcg total) by mouth daily. 30 tablet    diphenhydrAMINE (BENADRYL) 25 MG tablet Take 25 mg by mouth daily.     ferrous sulfate 325 (65 FE) MG tablet TAKE 1 TABLET BY MOUTH EVERY DAY WITH BREAKFAST 90 tablet 2   furosemide (LASIX) 20 MG tablet Take 1 tablet (20 mg total) by mouth every other day. 60 tablet 2   gabapentin (NEURONTIN) 300 MG capsule TAKE 1 CAPSULE BY MOUTH THREE TIMES A DAY 270 capsule 3   Glycerin-Hypromellose-PEG 400 ([facility] DRY EYE RELIEF) 0.2-0.2-1 % SOLN Place 1 drop into both eyes  daily as needed (Dry eye).     hydrALAZINE (APRESOLINE) 25 MG tablet Take 25 mg by mouth 3 (three) times daily.     HYDROmorphone (DILAUDID) 8 MG tablet Take 8 mg by mouth 3 (three) times daily.     lenvatinib 20 mg daily dose (LENVIMA, 20 MG DAILY DOSE,) 2 x 10 MG capsule Take 2 capsules (20 mg total) by mouth daily. 60 capsule 1   Magnesium Oxide 400 MG CAPS Take 1 capsule (400 mg total) by mouth daily. 7 capsule 0   ondansetron (ZOFRAN) 4 MG tablet TAKE ONE TABLET EVERY MORNING. CAN TAKE ONE TABLET LATER IN  THE DAY ON AN AS NEEDED BASIS 50 tablet 11   PAIN MANAGEMENT INTRATHECAL, IT, PUMP 1 each by Intrathecal route continuous. Intrathecal (IT) medication:   Morphine.  Can push every 4 hours     pantoprazole (PROTONIX) 40 MG tablet Take 1 tablet (40 mg total) by mouth daily. 30 tablet 3   tiZANidine (ZANAFLEX) 4 MG tablet Take 4 mg by mouth 3 (three) times daily.     VELTASSA 8.4 g packet Take 1 packet by mouth daily.     No current facility-administered medications for this visit.   Facility-Administered Medications Ordered in Other Visits  Medication Dose Route Frequency Provider Last Rate Last Admin   0.9 %  sodium chloride infusion   Intravenous Once Haruki Arnold, Mathis Dad, MD       pembrolizumab [facility]) 400 mg in sodium chloride 0.9 % 50 mL chemo infusion  400 mg Intravenous Once [name], MD       Zoledronic Acid (ZOMETA) IVPB 4 mg  4 mg Intravenous Once [name], MD         Allergies:  Allergies  Allergen Reactions   Ceftriaxone Hives    Other reaction(s): red streaks    Hydrocodone Swelling    Physical examination:     Blood pressure (!) 142/72, pulse 70, temperature 97.9 F (36.6 C), temperature source Temporal, resp. rate 17, height 6\' 3"  (1.905 m), weight 172 lb 11.2 oz (78.3 kg), SpO2 100 %.       ECOG 1    General appearance: Alert, awake without any distress. Head: Atraumatic without abnormalities Oropharynx: Without any thrush or ulcers. Eyes: No scleral icterus. Lymph nodes: No lymphadenopathy noted in the cervical, supraclavicular, or axillary nodes Heart:regular rate and rhythm, without any murmurs or gallops.   Lung: Clear to auscultation without any rhonchi, wheezes or dullness to percussion. Abdomin: Soft, nontender without any shifting dullness or ascites. Musculoskeletal: No clubbing or cyanosis. Neurological: No motor or sensory deficits. Skin: No rashes or lesions.                     Lab Results: Lab Results   Component Value Date   WBC 6.0 01/10/2022   HGB 12.5 (L) 01/10/2022   HCT 40.2 01/10/2022   MCV 98.5 01/10/2022   PLT 115 (L) 01/10/2022     Chemistry      Component Value Date/Time   NA 135 12/31/2021 0827   NA 138 02/27/2021 1102   NA 142 12/24/2017 1317   K 4.8 12/31/2021 0827   K 4.3 12/24/2017 1317   CL 99 12/31/2021 0827   CL 106 05/24/2013 1436   CO2 28 12/31/2021 0827   CO2 32 (H) 12/24/2017 1317   BUN 17 12/31/2021 0827   BUN 28 (H) 02/27/2021 1102   BUN 10.3 12/24/2017 1317   CREATININE 1.45 (  H) 12/31/2021 0827   CREATININE 1.31 (H) 11/26/2021 0802   CREATININE 0.9 12/24/2017 1317   GLU 160 (H) 06/29/2015 0957      Component Value Date/Time   CALCIUM 9.6 12/31/2021 0827   CALCIUM 8.8 12/24/2017 1317   ALKPHOS 110 11/26/2021 0802   ALKPHOS 95 12/24/2017 1317   AST 25 11/26/2021 0802   AST 20 12/24/2017 1317   ALT 26 11/26/2021 0802   ALT 25 12/24/2017 1317   BILITOT 0.3 11/26/2021 0802   BILITOT 0.25 12/24/2017 1317       Impression and Plan:   56 year old man with   1.  Stage IV clear-cell renal cell carcinoma with bone and pulmonary involvement.    He has tolerated Lenvima with Pembrolizumab without any major complications.  Risks and benefits of continuing this treatment were reviewed.  Autoimmune complications versus GI toxicity were discussed.  CT scan obtained in December 2022 was reiterated and continues to show reasonable response to therapy.  The plan is to update his staging scans in the next 3 to 4 months.  2.  Chronic pain: Related to metastatic disease to the bone.  Pain is manageable.      3.  Prognosis: Aggressive measures are warranted given his reasonable performance status.   4.  Hypertension: No issues with blood pressure at this time.  Blood pressure is within normal range.   5.  Anxiety: His mood remained stable on the current antidepressant medication.  6.  Autoimmune complications: I continue to address autoimmune issues  including pneumonitis, colitis and thyroid disease.   7.  Follow-up: In 6 weeks for repeat evaluation.   30  minutes were spent on this encounter.  The time was dedicated to reviewing laboratory data, disease status update and outlining future plan of care discussion.  Zola Button, MD 01/10/2022 8:43 AM

## 2022-01-13 ENCOUNTER — Other Ambulatory Visit (HOSPITAL_COMMUNITY): Payer: Self-pay

## 2022-01-15 ENCOUNTER — Encounter (HOSPITAL_COMMUNITY): Payer: Self-pay | Admitting: Neurological Surgery

## 2022-01-20 ENCOUNTER — Other Ambulatory Visit (HOSPITAL_COMMUNITY): Payer: Self-pay

## 2022-01-28 ENCOUNTER — Other Ambulatory Visit: Payer: Self-pay | Admitting: Oncology

## 2022-01-28 DIAGNOSIS — C649 Malignant neoplasm of unspecified kidney, except renal pelvis: Secondary | ICD-10-CM

## 2022-01-28 DIAGNOSIS — G894 Chronic pain syndrome: Secondary | ICD-10-CM | POA: Diagnosis not present

## 2022-01-28 DIAGNOSIS — Z9689 Presence of other specified functional implants: Secondary | ICD-10-CM | POA: Diagnosis not present

## 2022-01-28 MED ORDER — ALPRAZOLAM 1 MG PO TABS: 1.0000 mg | ORAL_TABLET | Freq: Three times a day (TID) | ORAL | 0 refills | Status: DC | PRN

## 2022-02-03 ENCOUNTER — Other Ambulatory Visit (HOSPITAL_COMMUNITY): Payer: Self-pay

## 2022-02-05 ENCOUNTER — Other Ambulatory Visit (HOSPITAL_COMMUNITY): Payer: Self-pay

## 2022-02-10 ENCOUNTER — Other Ambulatory Visit (HOSPITAL_COMMUNITY): Payer: Self-pay

## 2022-02-11 DIAGNOSIS — C642 Malignant neoplasm of left kidney, except renal pelvis: Secondary | ICD-10-CM | POA: Diagnosis not present

## 2022-02-11 DIAGNOSIS — E119 Type 2 diabetes mellitus without complications: Secondary | ICD-10-CM | POA: Diagnosis not present

## 2022-02-13 ENCOUNTER — Other Ambulatory Visit (HOSPITAL_COMMUNITY): Payer: Self-pay

## 2022-02-21 ENCOUNTER — Inpatient Hospital Stay: Payer: Medicare Other | Attending: Oncology | Admitting: Oncology

## 2022-02-21 ENCOUNTER — Inpatient Hospital Stay: Payer: Medicare Other

## 2022-02-21 DIAGNOSIS — C7800 Secondary malignant neoplasm of unspecified lung: Secondary | ICD-10-CM | POA: Insufficient documentation

## 2022-02-21 DIAGNOSIS — C7951 Secondary malignant neoplasm of bone: Secondary | ICD-10-CM | POA: Insufficient documentation

## 2022-02-21 DIAGNOSIS — Z5112 Encounter for antineoplastic immunotherapy: Secondary | ICD-10-CM | POA: Insufficient documentation

## 2022-02-21 DIAGNOSIS — C642 Malignant neoplasm of left kidney, except renal pelvis: Secondary | ICD-10-CM | POA: Insufficient documentation

## 2022-02-21 DIAGNOSIS — Z79899 Other long term (current) drug therapy: Secondary | ICD-10-CM | POA: Insufficient documentation

## 2022-02-22 ENCOUNTER — Other Ambulatory Visit: Payer: Self-pay | Admitting: Cardiovascular Disease

## 2022-02-24 ENCOUNTER — Other Ambulatory Visit: Payer: Self-pay | Admitting: Oncology

## 2022-02-24 ENCOUNTER — Inpatient Hospital Stay: Payer: Medicare Other

## 2022-02-24 ENCOUNTER — Other Ambulatory Visit: Payer: Self-pay

## 2022-02-24 DIAGNOSIS — Z79899 Other long term (current) drug therapy: Secondary | ICD-10-CM | POA: Diagnosis not present

## 2022-02-24 DIAGNOSIS — Z5112 Encounter for antineoplastic immunotherapy: Secondary | ICD-10-CM | POA: Diagnosis not present

## 2022-02-24 DIAGNOSIS — C642 Malignant neoplasm of left kidney, except renal pelvis: Secondary | ICD-10-CM | POA: Diagnosis not present

## 2022-02-24 DIAGNOSIS — C649 Malignant neoplasm of unspecified kidney, except renal pelvis: Secondary | ICD-10-CM

## 2022-02-24 DIAGNOSIS — C7951 Secondary malignant neoplasm of bone: Secondary | ICD-10-CM | POA: Diagnosis not present

## 2022-02-24 DIAGNOSIS — C7800 Secondary malignant neoplasm of unspecified lung: Secondary | ICD-10-CM | POA: Diagnosis not present

## 2022-02-24 LAB — CBC WITH DIFFERENTIAL (CANCER CENTER ONLY)
Abs Immature Granulocytes: 0 10*3/uL (ref 0.00–0.07)
Basophils Absolute: 0.1 10*3/uL (ref 0.0–0.1)
Basophils Relative: 1 %
Eosinophils Absolute: 0.2 10*3/uL (ref 0.0–0.5)
Eosinophils Relative: 5 %
HCT: 39.7 % (ref 39.0–52.0)
Hemoglobin: 12.7 g/dL — ABNORMAL LOW (ref 13.0–17.0)
Immature Granulocytes: 0 %
Lymphocytes Relative: 25 %
Lymphs Abs: 1.1 10*3/uL (ref 0.7–4.0)
MCH: 30.2 pg (ref 26.0–34.0)
MCHC: 32 g/dL (ref 30.0–36.0)
MCV: 94.3 fL (ref 80.0–100.0)
Monocytes Absolute: 0.5 10*3/uL (ref 0.1–1.0)
Monocytes Relative: 12 %
Neutro Abs: 2.5 10*3/uL (ref 1.7–7.7)
Neutrophils Relative %: 57 %
Platelet Count: 121 10*3/uL — ABNORMAL LOW (ref 150–400)
RBC: 4.21 MIL/uL — ABNORMAL LOW (ref 4.22–5.81)
RDW: 15.8 % — ABNORMAL HIGH (ref 11.5–15.5)
WBC Count: 4.4 10*3/uL (ref 4.0–10.5)
nRBC: 0 % (ref 0.0–0.2)

## 2022-02-24 LAB — CMP (CANCER CENTER ONLY)
ALT: 7 U/L (ref 0–44)
AST: 15 U/L (ref 15–41)
Albumin: 3.2 g/dL — ABNORMAL LOW (ref 3.5–5.0)
Alkaline Phosphatase: 76 U/L (ref 38–126)
Anion gap: 6 (ref 5–15)
BUN: 14 mg/dL (ref 6–20)
CO2: 30 mmol/L (ref 22–32)
Calcium: 8.8 mg/dL — ABNORMAL LOW (ref 8.9–10.3)
Chloride: 96 mmol/L — ABNORMAL LOW (ref 98–111)
Creatinine: 1.47 mg/dL — ABNORMAL HIGH (ref 0.61–1.24)
GFR, Estimated: 56 mL/min — ABNORMAL LOW (ref >60)
Glucose, Bld: 139 mg/dL — ABNORMAL HIGH (ref 70–99)
Potassium: 3.7 mmol/L (ref 3.5–5.1)
Sodium: 132 mmol/L — ABNORMAL LOW (ref 135–145)
Total Bilirubin: 0.5 mg/dL (ref 0.3–1.2)
Total Protein: 7.3 g/dL (ref 6.5–8.1)

## 2022-02-25 ENCOUNTER — Encounter: Payer: Self-pay | Admitting: Oncology

## 2022-02-27 ENCOUNTER — Inpatient Hospital Stay: Payer: Medicare Other

## 2022-02-27 ENCOUNTER — Telehealth: Payer: Self-pay | Admitting: Oncology

## 2022-02-27 ENCOUNTER — Inpatient Hospital Stay: Payer: Medicare Other | Admitting: Oncology

## 2022-02-27 NOTE — Telephone Encounter (Signed)
Rescheduled 03/03 appointment per patient's request, patient has been called and notified of 03/10 appointments. ?

## 2022-02-28 ENCOUNTER — Inpatient Hospital Stay: Payer: Medicare Other

## 2022-02-28 ENCOUNTER — Other Ambulatory Visit: Payer: Self-pay

## 2022-02-28 ENCOUNTER — Inpatient Hospital Stay (HOSPITAL_BASED_OUTPATIENT_CLINIC_OR_DEPARTMENT_OTHER): Payer: Medicare Other | Admitting: Oncology

## 2022-02-28 VITALS — BP 151/108 | HR 77 | Temp 98.1°F | Resp 19 | Ht 75.0 in | Wt 170.2 lb

## 2022-02-28 DIAGNOSIS — C649 Malignant neoplasm of unspecified kidney, except renal pelvis: Secondary | ICD-10-CM

## 2022-02-28 DIAGNOSIS — Z5112 Encounter for antineoplastic immunotherapy: Secondary | ICD-10-CM | POA: Diagnosis not present

## 2022-02-28 DIAGNOSIS — C7951 Secondary malignant neoplasm of bone: Secondary | ICD-10-CM | POA: Diagnosis not present

## 2022-02-28 DIAGNOSIS — C642 Malignant neoplasm of left kidney, except renal pelvis: Secondary | ICD-10-CM | POA: Diagnosis not present

## 2022-02-28 DIAGNOSIS — C7800 Secondary malignant neoplasm of unspecified lung: Secondary | ICD-10-CM | POA: Diagnosis not present

## 2022-02-28 DIAGNOSIS — Z79899 Other long term (current) drug therapy: Secondary | ICD-10-CM | POA: Diagnosis not present

## 2022-02-28 MED ORDER — SODIUM CHLORIDE 0.9 % IV SOLN
400.0000 mg | Freq: Once | INTRAVENOUS | Status: AC
  Administered 2022-02-28: 400 mg via INTRAVENOUS
  Filled 2022-02-28: qty 16

## 2022-02-28 MED ORDER — SODIUM CHLORIDE 0.9 % IV SOLN: Freq: Once | INTRAVENOUS | Status: AC

## 2022-02-28 NOTE — Progress Notes (Signed)
Hematology and Oncology Follow Up  ? ?Keith [name] ?[id number] ?1966-04-06 56 y.o. ?02/28/2022 11:42 AM ?[name], MDPharr, [name], MD  ? ? ? ? ? 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 ?Prior Therapy: ?Status post laparoscopic radical nephrectomy.  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  ?Patient status post thoracotomy for a synchronous metastatic lung lesions done October 2009.   ?Patient is status post stereotactic radiotherapy to pulmonary nodules in May of 2010. ?He is S/P Sutent 50 mg 4 weeks on 2 weeks off from 10/2010 to 03/2013. He progressed at that time.  ?He is S/P radiation to the right sacral bone between 4/22 to 4/30.  ?He is S/P XRT to the left shoulder 03/20/14 to 03/31/14. ?Votrient 800 mg by mouth daily from 03/2013 through 06/22/2015. Discontinued secondary to disease progression. ?Nivolumab 3 mg/kg given every 2 weeks started on 06/29/2015. He is status post 4 cycles completed 08/10/2015. He developed disease progression in September 2016.  ?Status post radiation therapy to the left mid fibula completed on 11/14/2015. He received a grade 1 fraction. ?He is status post radiation to the proximal and distal femur over 2 weeks and 10 fractions of total of 30 Gy.  This was completed in March 2019. ?11.   Radiation therapy to the right tibia.  Last treatment completed in September 2019. ?12. Cabometyx 60 mg daily started in November 2016.  Therapy discontinued and February 2022 due to progression of disease. ?13.  Axitinib 5 mg twice a day started in March 2022.  Therapy discontinued in June 2022 due to progression of disease. ? ? ?Current therapy: Pembrolizumab 200 mg every 3 weeks with lenvatinib 20 mg daily started on May 26, 2021.  He is currently on 400 mg of Pembrolizumab every 6 weeks. ? ?Interim History: Mr. Keith [name] presents today for repeat evaluation.  His treatment was  postponed due to acute GI illness which has resolved at this time.  He had issues with nausea and diarrhea and vomiting which has resolved currently.  He denies any nausea or abdominal pain.  He denies any bone pain or pathological fractures.  His performance status and quality of life present on 2 normal.  Quality of life remains unchanged.  He continues to tolerate this treatment without any issues. ? ?Medications: Updated on review. ?Current Outpatient Medications  ?Medication Sig Dispense Refill  ? ALPRAZolam (XANAX) 1 MG tablet TAKE 1 TABLET BY MOUTH EVERY 8 HOURS AS NEEDED. 90 tablet 0  ? carvedilol (COREG) 12.5 MG tablet TAKE 1 TABLET (12.5 MG TOTAL) BY MOUTH 2 (TWO) TIMES DAILY WITH A MEAL. 180 tablet 3  ? Cholecalciferol (VITAMIN D3) 125 MCG (5000 UT) CAPS Take 5,000 Units by mouth daily.    ? Cyanocobalamin (B-12) 1000 MCG TABS Take 1 tablet (1,000 mcg total) by mouth daily. 30 tablet   ? diphenhydrAMINE (BENADRYL) 25 MG tablet Take 25 mg by mouth daily.    ? ferrous sulfate 325 (65 FE) MG tablet TAKE 1 TABLET BY MOUTH EVERY DAY WITH BREAKFAST 90 tablet 2  ? furosemide (LASIX) 20 MG tablet Take 1 tablet (20 mg total) by mouth every other day. 60 tablet 2  ? gabapentin (NEURONTIN) 300 MG capsule TAKE 1 CAPSULE BY MOUTH THREE TIMES A DAY 270 capsule 3  ? Glycerin-Hypromellose-PEG 400 ([facility] DRY EYE RELIEF) 0.2-0.2-1 % SOLN Place 1 drop into both eyes daily as needed (Dry eye).    ?  hydrALAZINE (APRESOLINE) 25 MG tablet TAKE 1 TABLET BY MOUTH THREE TIMES A DAY 270 tablet 3  ? HYDROmorphone (DILAUDID) 8 MG tablet Take 8 mg by mouth 3 (three) times daily.    ? lenvatinib 20 mg daily dose (LENVIMA, 20 MG DAILY DOSE,) 2 x 10 MG capsule Take 2 capsules (20 mg total) by mouth daily. 60 capsule 1  ? Magnesium Oxide 400 MG CAPS Take 1 capsule (400 mg total) by mouth daily. 7 capsule 0  ? ondansetron (ZOFRAN) 4 MG tablet TAKE ONE TABLET EVERY MORNING. CAN TAKE ONE TABLET LATER IN THE DAY ON AN AS NEEDED BASIS 50 tablet  11  ? PAIN MANAGEMENT INTRATHECAL, IT, PUMP 1 each by Intrathecal route continuous. Intrathecal (IT) medication:   Morphine.  ?Can push every 4 hours    ? pantoprazole (PROTONIX) 40 MG tablet Take 1 tablet (40 mg total) by mouth daily. 30 tablet 3  ? tiZANidine (ZANAFLEX) 4 MG tablet Take 4 mg by mouth 3 (three) times daily.    ? VELTASSA 8.4 g packet Take 1 packet by mouth daily.    ? ?No current facility-administered medications for this visit.  ? ? ? ?Allergies:  ?Allergies  ?Allergen Reactions  ? Ceftriaxone Hives  ?  Other reaction(s): red streaks ?  ? Hydrocodone Swelling  ? ? ?Physical examination: ? ? ? ? ?Blood pressure (!) 151/108, pulse 77, temperature 98.1 ?F (36.7 ?C), temperature source Tympanic, resp. rate 19, height 6\' 3"  (1.905 m), weight 170 lb 3.2 oz (77.2 kg), SpO2 99 %. ? ? ? ? ? ? ?ECOG 1 ? ? ?General appearance: Comfortable appearing without any discomfort ?Head: Normocephalic without any trauma ?Oropharynx: Mucous membranes are moist and pink without any thrush or ulcers. ?Eyes: Pupils are equal and round reactive to light. ?Lymph nodes: No cervical, supraclavicular, inguinal or axillary lymphadenopathy.   ?Heart:regular rate and rhythm.  S1 and S2 without leg edema. ?Lung: Clear without any rhonchi or wheezes.  No dullness to percussion. ?Abdomin: Soft, nontender, nondistended with good bowel sounds.  No hepatosplenomegaly. ?Musculoskeletal: No joint deformity or effusion.  Full range of motion noted. ?Neurological: No deficits noted on motor, sensory and deep tendon reflex exam. ?Skin: No petechial rash or dryness.  Appeared moist.  ? ? ? ? ? ? ? ? ? ? ? ? ? ? ? ? ? ? ? ? ? ?Lab Results: ?Lab Results  ?Component Value Date  ? WBC 4.4 02/24/2022  ? HGB 12.7 (L) 02/24/2022  ? HCT 39.7 02/24/2022  ? MCV 94.3 02/24/2022  ? PLT 121 (L) 02/24/2022  ? ?  Chemistry   ?   ?Component Value Date/Time  ? NA 132 (L) 02/24/2022 0750  ? NA 138 02/27/2021 1102  ? NA 142 12/24/2017 1317  ? K 3.7 02/24/2022  0750  ? K 4.3 12/24/2017 1317  ? CL 96 (L) 02/24/2022 0750  ? CL 106 05/24/2013 1436  ? CO2 30 02/24/2022 0750  ? CO2 32 (H) 12/24/2017 1317  ? BUN 14 02/24/2022 0750  ? BUN 28 (H) 02/27/2021 1102  ? BUN 10.3 12/24/2017 1317  ? CREATININE 1.47 (H) 02/24/2022 0750  ? CREATININE 0.9 12/24/2017 1317  ? GLU 160 (H) 06/29/2015 0957  ?    ?Component Value Date/Time  ? CALCIUM 8.8 (L) 02/24/2022 0750  ? CALCIUM 8.8 12/24/2017 1317  ? ALKPHOS 76 02/24/2022 0750  ? ALKPHOS 95 12/24/2017 1317  ? AST 15 02/24/2022 0750  ? AST 20 12/24/2017 1317  ?  ALT 7 02/24/2022 0750  ? ALT 25 12/24/2017 1317  ? BILITOT 0.5 02/24/2022 0750  ? BILITOT 0.25 12/24/2017 1317  ?  ? ? ? ?Impression and Plan: ? ? ?56 year old man with ?  ?1.  Kidney cancer diagnosed in 2009.  He has stage IV clear-cell renal cell carcinoma with bone and pulmonary disease at this time. ?  ?He continues to be on Pembrolizumab and lenvatinib without any major complications.  Risks and benefits of continuing this treatment were discussed at this time.  Alternative treatment options including a different oral targeted therapy.  The plan is to update his staging scans in the next 6 weeks. ? ? ?2.  Chronic pain: Related to his advanced malignancy with pain is manageable. ?   ?  ?3.  Prognosis: Therapy remains palliative although aggressive measures are warranted. ? ? ?4.  Hypertension: Mild elevation noted at this time we will continue to monitor on lenvatinib. ? ? ?5.  Anxiety: Overall he is stable mood with anxiety under control. ? ?6.  Autoimmune complications: He has not experienced any complications at this time.  These include pneumonitis, colitis and thyroid disease. ? ? ?7.  Follow-up: He will return in 6 months for a follow-up evaluation. ? ? ?30  minutes were dedicated to this visit.  Time spent on reviewing laboratory data, disease status update and outlining future plan of care discussion. ? ?Zola Button, MD 02/28/2022 11:42 AM ?

## 2022-02-28 NOTE — Patient Instructions (Signed)
Keith [name]  Discharge Instructions: ?Thank you for choosing [facility] to provide your oncology and hematology care.  ? ?If you have a lab appointment with the [facility], please go directly to the [facility] and check in at the registration area. ?  ?Wear comfortable clothing and clothing appropriate for easy access to any Portacath or PICC line.  ? ?We strive to give you quality time with your provider. You may need to reschedule your appointment if you arrive late (15 or more minutes).  Arriving late affects you and other patients whose appointments are after yours.  Also, if you miss three or more appointments without notifying the office, you may be dismissed from the clinic at the provider?s discretion.    ?  ?For prescription refill requests, have your pharmacy contact our office and allow 72 hours for refills to be completed.   ? ?Today you received the following chemotherapy and/or immunotherapy agents :  Pembrolizumab    ?  ?To help prevent nausea and vomiting after your treatment, we encourage you to take your nausea medication as directed. ? ?BELOW ARE SYMPTOMS THAT SHOULD BE REPORTED IMMEDIATELY: ?*FEVER GREATER THAN 100.4 F (38 ?C) OR HIGHER ?*CHILLS OR SWEATING ?*NAUSEA AND VOMITING THAT IS NOT CONTROLLED WITH YOUR NAUSEA MEDICATION ?*UNUSUAL SHORTNESS OF BREATH ?*UNUSUAL BRUISING OR BLEEDING ?*URINARY PROBLEMS (pain or burning when urinating, or frequent urination) ?*BOWEL PROBLEMS (unusual diarrhea, constipation, pain near the anus) ?TENDERNESS IN MOUTH AND THROAT WITH OR WITHOUT PRESENCE OF ULCERS (sore throat, sores in mouth, or a toothache) ?UNUSUAL RASH, SWELLING OR PAIN  ?UNUSUAL VAGINAL DISCHARGE OR ITCHING  ? ?Items with * indicate a potential emergency and should be followed up as soon as possible or go to the Emergency Department if any problems should occur. ? ?Please show the CHEMOTHERAPY ALERT CARD or IMMUNOTHERAPY ALERT CARD at  check-in to the Emergency Department and triage nurse. ? ?Should you have questions after your visit or need to cancel or reschedule your appointment, please contact [facility]  Dept: [phone]  and follow the prompts.  Office hours are 8:00 a.m. to 4:30 p.m. Monday - Friday. Please note that voicemails left after 4:00 p.m. may not be returned until the following business day.  We are closed weekends and major holidays. You have access to a nurse at all times for urgent questions. Please call the main number to the clinic Dept: [phone] and follow the prompts. ? ? ?For any non-urgent questions, you may also contact your provider using MyChart. We now offer e-Visits for anyone 4 and older to request care online for non-urgent symptoms. For details visit [email or web address]. ?  ?Also download the MyChart app! Go to the app store, search "MyChart", open the app, select [facility], and log in with your MyChart username and password. ? ?Due to Covid, a mask is required upon entering the hospital/clinic. If you do not have a mask, one will be given to you upon arrival. For doctor visits, patients may have 1 support person aged 18 or older with them. For treatment visits, patients cannot have anyone with them due to current Covid guidelines and our immunocompromised population.  ? ?

## 2022-03-03 ENCOUNTER — Other Ambulatory Visit: Payer: Self-pay | Admitting: Oncology

## 2022-03-03 ENCOUNTER — Other Ambulatory Visit (HOSPITAL_COMMUNITY): Payer: Self-pay

## 2022-03-04 ENCOUNTER — Encounter: Payer: Self-pay | Admitting: Oncology

## 2022-03-04 ENCOUNTER — Other Ambulatory Visit (HOSPITAL_COMMUNITY): Payer: Self-pay

## 2022-03-04 MED ORDER — LENVIMA (20 MG DAILY DOSE) 2 X 10 MG PO CPPK
20.0000 mg | ORAL_CAPSULE | Freq: Every day | ORAL | 1 refills | Status: AC
Start: 2022-03-04 — End: ?
  Filled 2022-03-04: qty 60, 30d supply, fill #0

## 2022-03-06 ENCOUNTER — Telehealth: Payer: Self-pay | Admitting: *Deleted

## 2022-03-06 NOTE — Telephone Encounter (Signed)
Wife states patient has gotten worse over the last week. Has been going down hill since the end of February. "Has no energy, no appetite, sleeps all of the time and doesn't talk much. He looks like someone who is going to die soon" ?Was doing OK at the start of Keytruda, but she is wondering if these are side effects. Is asking if he can have scan sooner than end of April. Also wants to know if Palliative Care would be beneficial.  ?

## 2022-03-06 NOTE — Telephone Encounter (Signed)
Palliative care referral called to [facility] ?

## 2022-03-13 ENCOUNTER — Other Ambulatory Visit (HOSPITAL_COMMUNITY): Payer: Self-pay

## 2022-03-16 ENCOUNTER — Encounter (HOSPITAL_COMMUNITY): Payer: Self-pay | Admitting: Emergency Medicine

## 2022-03-16 ENCOUNTER — Emergency Department (HOSPITAL_COMMUNITY): Payer: Medicare Other

## 2022-03-16 ENCOUNTER — Other Ambulatory Visit: Payer: Self-pay

## 2022-03-16 ENCOUNTER — Inpatient Hospital Stay (HOSPITAL_COMMUNITY)
Admission: EM | Admit: 2022-03-16 | Discharge: 2022-03-18 | DRG: 641 | Disposition: A | Discharge status: Home / Self Care | Payer: Medicare Other | Attending: Internal Medicine | Admitting: Internal Medicine

## 2022-03-16 DIAGNOSIS — R531 Weakness: Secondary | ICD-10-CM | POA: Diagnosis not present

## 2022-03-16 DIAGNOSIS — G893 Neoplasm related pain (acute) (chronic): Secondary | ICD-10-CM | POA: Diagnosis not present

## 2022-03-16 DIAGNOSIS — R112 Nausea with vomiting, unspecified: Secondary | ICD-10-CM | POA: Diagnosis not present

## 2022-03-16 DIAGNOSIS — R809 Proteinuria, unspecified: Secondary | ICD-10-CM | POA: Diagnosis present

## 2022-03-16 DIAGNOSIS — C7951 Secondary malignant neoplasm of bone: Secondary | ICD-10-CM | POA: Diagnosis present

## 2022-03-16 DIAGNOSIS — R918 Other nonspecific abnormal finding of lung field: Secondary | ICD-10-CM | POA: Diagnosis not present

## 2022-03-16 DIAGNOSIS — E860 Dehydration: Principal | ICD-10-CM | POA: Diagnosis present

## 2022-03-16 DIAGNOSIS — N179 Acute kidney failure, unspecified: Secondary | ICD-10-CM | POA: Diagnosis present

## 2022-03-16 DIAGNOSIS — Z79899 Other long term (current) drug therapy: Secondary | ICD-10-CM

## 2022-03-16 DIAGNOSIS — E871 Hypo-osmolality and hyponatremia: Secondary | ICD-10-CM | POA: Diagnosis not present

## 2022-03-16 DIAGNOSIS — I110 Hypertensive heart disease with heart failure: Secondary | ICD-10-CM | POA: Diagnosis not present

## 2022-03-16 DIAGNOSIS — K5900 Constipation, unspecified: Secondary | ICD-10-CM | POA: Diagnosis not present

## 2022-03-16 DIAGNOSIS — F419 Anxiety disorder, unspecified: Secondary | ICD-10-CM | POA: Diagnosis present

## 2022-03-16 DIAGNOSIS — Z8249 Family history of ischemic heart disease and other diseases of the circulatory system: Secondary | ICD-10-CM | POA: Diagnosis not present

## 2022-03-16 DIAGNOSIS — Z85118 Personal history of other malignant neoplasm of bronchus and lung: Secondary | ICD-10-CM

## 2022-03-16 DIAGNOSIS — E876 Hypokalemia: Secondary | ICD-10-CM | POA: Diagnosis present

## 2022-03-16 DIAGNOSIS — L8990 Pressure ulcer of unspecified site, unspecified stage: Secondary | ICD-10-CM | POA: Insufficient documentation

## 2022-03-16 DIAGNOSIS — C801 Malignant (primary) neoplasm, unspecified: Secondary | ICD-10-CM | POA: Diagnosis not present

## 2022-03-16 DIAGNOSIS — G894 Chronic pain syndrome: Secondary | ICD-10-CM | POA: Diagnosis not present

## 2022-03-16 DIAGNOSIS — T50905A Adverse effect of unspecified drugs, medicaments and biological substances, initial encounter: Secondary | ICD-10-CM | POA: Diagnosis present

## 2022-03-16 DIAGNOSIS — Z6821 Body mass index (BMI) 21.0-21.9, adult: Secondary | ICD-10-CM

## 2022-03-16 DIAGNOSIS — R9431 Abnormal electrocardiogram [ECG] [EKG]: Secondary | ICD-10-CM

## 2022-03-16 DIAGNOSIS — E119 Type 2 diabetes mellitus without complications: Secondary | ICD-10-CM | POA: Diagnosis present

## 2022-03-16 DIAGNOSIS — Z902 Acquired absence of lung [part of]: Secondary | ICD-10-CM

## 2022-03-16 DIAGNOSIS — R822 Biliuria: Secondary | ICD-10-CM | POA: Diagnosis not present

## 2022-03-16 DIAGNOSIS — J439 Emphysema, unspecified: Secondary | ICD-10-CM | POA: Diagnosis present

## 2022-03-16 DIAGNOSIS — Q2579 Other congenital malformations of pulmonary artery: Secondary | ICD-10-CM

## 2022-03-16 DIAGNOSIS — D6959 Other secondary thrombocytopenia: Secondary | ICD-10-CM | POA: Diagnosis present

## 2022-03-16 DIAGNOSIS — C7889 Secondary malignant neoplasm of other digestive organs: Secondary | ICD-10-CM | POA: Diagnosis present

## 2022-03-16 DIAGNOSIS — R519 Headache, unspecified: Secondary | ICD-10-CM | POA: Diagnosis not present

## 2022-03-16 DIAGNOSIS — I5042 Chronic combined systolic (congestive) and diastolic (congestive) heart failure: Secondary | ICD-10-CM | POA: Diagnosis not present

## 2022-03-16 DIAGNOSIS — Z8041 Family history of malignant neoplasm of ovary: Secondary | ICD-10-CM

## 2022-03-16 DIAGNOSIS — Z20822 Contact with and (suspected) exposure to covid-19: Secondary | ICD-10-CM | POA: Diagnosis present

## 2022-03-16 DIAGNOSIS — I771 Stricture of artery: Secondary | ICD-10-CM | POA: Diagnosis present

## 2022-03-16 DIAGNOSIS — L89151 Pressure ulcer of sacral region, stage 1: Secondary | ICD-10-CM | POA: Diagnosis not present

## 2022-03-16 DIAGNOSIS — M5417 Radiculopathy, lumbosacral region: Secondary | ICD-10-CM | POA: Diagnosis present

## 2022-03-16 DIAGNOSIS — E46 Unspecified protein-calorie malnutrition: Secondary | ICD-10-CM | POA: Diagnosis not present

## 2022-03-16 DIAGNOSIS — Z86711 Personal history of pulmonary embolism: Secondary | ICD-10-CM

## 2022-03-16 DIAGNOSIS — I700 Atherosclerosis of aorta: Secondary | ICD-10-CM | POA: Diagnosis not present

## 2022-03-16 DIAGNOSIS — Z881 Allergy status to other antibiotic agents status: Secondary | ICD-10-CM

## 2022-03-16 DIAGNOSIS — F17210 Nicotine dependence, cigarettes, uncomplicated: Secondary | ICD-10-CM | POA: Diagnosis not present

## 2022-03-16 DIAGNOSIS — Z923 Personal history of irradiation: Secondary | ICD-10-CM

## 2022-03-16 DIAGNOSIS — Z85528 Personal history of other malignant neoplasm of kidney: Secondary | ICD-10-CM

## 2022-03-16 DIAGNOSIS — Z905 Acquired absence of kidney: Secondary | ICD-10-CM

## 2022-03-16 DIAGNOSIS — Z833 Family history of diabetes mellitus: Secondary | ICD-10-CM

## 2022-03-16 DIAGNOSIS — Z885 Allergy status to narcotic agent status: Secondary | ICD-10-CM

## 2022-03-16 LAB — COMPREHENSIVE METABOLIC PANEL
ALT: 20 U/L (ref 0–44)
AST: 43 U/L — ABNORMAL HIGH (ref 15–41)
Albumin: 2.9 g/dL — ABNORMAL LOW (ref 3.5–5.0)
Alkaline Phosphatase: 129 U/L — ABNORMAL HIGH (ref 38–126)
Anion gap: 10 (ref 5–15)
BUN: 17 mg/dL (ref 6–20)
CO2: 28 mmol/L (ref 22–32)
Calcium: 8.2 mg/dL — ABNORMAL LOW (ref 8.9–10.3)
Chloride: 95 mmol/L — ABNORMAL LOW (ref 98–111)
Creatinine, Ser: 2.35 mg/dL — ABNORMAL HIGH (ref 0.61–1.24)
GFR, Estimated: 32 mL/min — ABNORMAL LOW (ref >60)
Glucose, Bld: 109 mg/dL — ABNORMAL HIGH (ref 70–99)
Potassium: 3.2 mmol/L — ABNORMAL LOW (ref 3.5–5.1)
Sodium: 133 mmol/L — ABNORMAL LOW (ref 135–145)
Total Bilirubin: 1.1 mg/dL (ref 0.3–1.2)
Total Protein: 7.1 g/dL (ref 6.5–8.1)

## 2022-03-16 LAB — URINALYSIS, ROUTINE W REFLEX MICROSCOPIC
Glucose, UA: NEGATIVE mg/dL
Hgb urine dipstick: NEGATIVE
Ketones, ur: NEGATIVE mg/dL
Leukocytes,Ua: NEGATIVE
Nitrite: NEGATIVE
Protein, ur: >300 mg/dL — AB
Specific Gravity, Urine: 1.02 (ref 1.005–1.030)
pH: 5.5 (ref 5.0–8.0)

## 2022-03-16 LAB — GLUCOSE, CAPILLARY: Glucose-Capillary: 131 mg/dL — ABNORMAL HIGH (ref 70–99)

## 2022-03-16 LAB — CBC WITH DIFFERENTIAL/PLATELET
Abs Immature Granulocytes: 0.01 10*3/uL (ref 0.00–0.07)
Basophils Absolute: 0.1 10*3/uL (ref 0.0–0.1)
Basophils Relative: 1 %
Eosinophils Absolute: 0.4 10*3/uL (ref 0.0–0.5)
Eosinophils Relative: 7 %
HCT: 42.7 % (ref 39.0–52.0)
Hemoglobin: 13.5 g/dL (ref 13.0–17.0)
Immature Granulocytes: 0 %
Lymphocytes Relative: 28 %
Lymphs Abs: 1.5 10*3/uL (ref 0.7–4.0)
MCH: 29.7 pg (ref 26.0–34.0)
MCHC: 31.6 g/dL (ref 30.0–36.0)
MCV: 94.1 fL (ref 80.0–100.0)
Monocytes Absolute: 0.4 10*3/uL (ref 0.1–1.0)
Monocytes Relative: 8 %
Neutro Abs: 3 10*3/uL (ref 1.7–7.7)
Neutrophils Relative %: 56 %
Platelets: 86 10*3/uL — ABNORMAL LOW (ref 150–400)
RBC: 4.54 MIL/uL (ref 4.22–5.81)
RDW: 15.3 % (ref 11.5–15.5)
WBC: 5.4 10*3/uL (ref 4.0–10.5)
nRBC: 0 % (ref 0.0–0.2)

## 2022-03-16 LAB — URINALYSIS, MICROSCOPIC (REFLEX): Squamous Epithelial / HPF: NONE SEEN (ref 0–5)

## 2022-03-16 LAB — LIPASE, BLOOD: Lipase: 21 U/L (ref 11–51)

## 2022-03-16 LAB — TROPONIN I (HIGH SENSITIVITY)
Troponin I (High Sensitivity): 11 ng/L (ref <18)
Troponin I (High Sensitivity): 11 ng/L (ref <18)

## 2022-03-16 LAB — BRAIN NATRIURETIC PEPTIDE: B Natriuretic Peptide: 62.6 pg/mL (ref 0.0–100.0)

## 2022-03-16 LAB — RESP PANEL BY RT-PCR (FLU A&B, COVID) ARPGX2
Influenza A by PCR: NEGATIVE
Influenza B by PCR: NEGATIVE
SARS Coronavirus 2 by RT PCR: NEGATIVE

## 2022-03-16 LAB — LACTIC ACID, PLASMA: Lactic Acid, Venous: 1.5 mmol/L (ref 0.5–1.9)

## 2022-03-16 MED ORDER — GLYCERIN-HYPROMELLOSE-PEG 400 0.2-0.2-1 % OP SOLN: 1.0000 [drp] | Freq: Every day | OPHTHALMIC | Status: DC | PRN

## 2022-03-16 MED ORDER — LACTATED RINGERS IV SOLN: INTRAVENOUS | Status: AC

## 2022-03-16 MED ORDER — POLYVINYL ALCOHOL 1.4 % OP SOLN: 1.0000 [drp] | OPHTHALMIC | Status: DC | PRN

## 2022-03-16 MED ORDER — DIPHENHYDRAMINE HCL 25 MG PO CAPS
25.0000 mg | ORAL_CAPSULE | Freq: Two times a day (BID) | ORAL | Status: DC | PRN
  Administered 2022-03-17: 25 mg via ORAL
  Filled 2022-03-16: qty 1

## 2022-03-16 MED ORDER — LENVATINIB (20 MG DAILY DOSE) 2 X 10 MG PO CPPK
20.0000 mg | ORAL_CAPSULE | Freq: Every day | ORAL | Status: DC
  Filled 2022-03-16: qty 2

## 2022-03-16 MED ORDER — CARVEDILOL 12.5 MG PO TABS
12.5000 mg | ORAL_TABLET | Freq: Two times a day (BID) | ORAL | Status: DC
  Administered 2022-03-17 – 2022-03-18 (×3): 12.5 mg via ORAL
  Filled 2022-03-16 (×3): qty 1

## 2022-03-16 MED ORDER — VITAMIN D3 25 MCG (1000 UNIT) PO TABS
5000.0000 [IU] | ORAL_TABLET | Freq: Every morning | ORAL | Status: DC
  Administered 2022-03-17 – 2022-03-18 (×2): 5000 [IU] via ORAL
  Filled 2022-03-16 (×2): qty 5

## 2022-03-16 MED ORDER — LACTATED RINGERS IV BOLUS
500.0000 mL | Freq: Once | INTRAVENOUS | Status: AC
  Administered 2022-03-16: 500 mL via INTRAVENOUS

## 2022-03-16 MED ORDER — FERROUS SULFATE 325 (65 FE) MG PO TABS
325.0000 mg | ORAL_TABLET | Freq: Every day | ORAL | Status: DC
  Administered 2022-03-17 – 2022-03-18 (×2): 325 mg via ORAL
  Filled 2022-03-16 (×2): qty 1

## 2022-03-16 MED ORDER — METHYLPREDNISOLONE SODIUM SUCC 40 MG IJ SOLR
40.0000 mg | Freq: Once | INTRAMUSCULAR | Status: AC
Start: 2022-03-16 — End: 2022-03-16
  Administered 2022-03-16: 40 mg via INTRAVENOUS
  Filled 2022-03-16: qty 1

## 2022-03-16 MED ORDER — IPRATROPIUM-ALBUTEROL 0.5-2.5 (3) MG/3ML IN SOLN
3.0000 mL | Freq: Three times a day (TID) | RESPIRATORY_TRACT | Status: DC
  Administered 2022-03-16 – 2022-03-17 (×3): 3 mL via RESPIRATORY_TRACT
  Filled 2022-03-16 (×3): qty 3

## 2022-03-16 MED ORDER — ALPRAZOLAM 1 MG PO TABS
1.0000 mg | ORAL_TABLET | Freq: Three times a day (TID) | ORAL | Status: DC | PRN
  Administered 2022-03-17: 1 mg via ORAL
  Filled 2022-03-16: qty 1

## 2022-03-16 MED ORDER — HYDROMORPHONE HCL 4 MG PO TABS
8.0000 mg | ORAL_TABLET | Freq: Four times a day (QID) | ORAL | Status: DC | PRN
  Administered 2022-03-16 – 2022-03-17 (×2): 8 mg via ORAL
  Filled 2022-03-16 (×2): qty 2

## 2022-03-16 MED ORDER — IOHEXOL 350 MG/ML SOLN
100.0000 mL | Freq: Once | INTRAVENOUS | Status: AC | PRN
  Administered 2022-03-16: 100 mL via INTRAVENOUS

## 2022-03-16 MED ORDER — PANTOPRAZOLE SODIUM 40 MG PO TBEC
40.0000 mg | DELAYED_RELEASE_TABLET | Freq: Every day | ORAL | Status: DC
  Administered 2022-03-16 – 2022-03-18 (×3): 40 mg via ORAL
  Filled 2022-03-16 (×3): qty 1

## 2022-03-16 MED ORDER — FENTANYL CITRATE PF 50 MCG/ML IJ SOSY
25.0000 ug | PREFILLED_SYRINGE | Freq: Once | INTRAMUSCULAR | Status: AC
  Administered 2022-03-16: 25 ug via INTRAVENOUS
  Filled 2022-03-16: qty 1

## 2022-03-16 MED ORDER — IPRATROPIUM-ALBUTEROL 0.5-2.5 (3) MG/3ML IN SOLN: 3.0000 mL | Freq: Once | RESPIRATORY_TRACT | Status: DC

## 2022-03-16 MED ORDER — TIZANIDINE HCL 4 MG PO TABS: 4.0000 mg | ORAL_TABLET | Freq: Three times a day (TID) | ORAL | Status: DC | PRN

## 2022-03-16 MED ORDER — LACTATED RINGERS IV SOLN: INTRAVENOUS | Status: DC

## 2022-03-16 MED ORDER — POTASSIUM CHLORIDE CRYS ER 20 MEQ PO TBCR
40.0000 meq | EXTENDED_RELEASE_TABLET | Freq: Once | ORAL | Status: AC
  Administered 2022-03-16: 40 meq via ORAL
  Filled 2022-03-16: qty 2

## 2022-03-16 MED ORDER — LACTATED RINGERS IV BOLUS
1000.0000 mL | Freq: Once | INTRAVENOUS | Status: AC
  Administered 2022-03-16: 1000 mL via INTRAVENOUS

## 2022-03-16 MED ORDER — LACTATED RINGERS IV BOLUS
1000.0000 mL | Freq: Once | INTRAVENOUS | Status: AC
Start: 2022-03-16 — End: 2022-03-16
  Administered 2022-03-16: 1000 mL via INTRAVENOUS

## 2022-03-16 MED ORDER — DIPHENHYDRAMINE HCL 25 MG PO TABS
25.0000 mg | ORAL_TABLET | Freq: Two times a day (BID) | ORAL | Status: DC | PRN
  Filled 2022-03-16: qty 1

## 2022-03-16 NOTE — ED Triage Notes (Signed)
Patient c/o weakness, nausea, productive cough, and constipation. Hx kidney cancer mets. ?

## 2022-03-16 NOTE — ED Notes (Signed)
Pt given sprite 

## 2022-03-16 NOTE — H&P (Signed)
?History and Physical  ? ? ?Patient: Keith [name] WUJ:[id number] DOB: [birth year] ?DOA: 03/16/2022 ?DOS: the patient was seen and examined on 03/16/2022 ?PCP: [name], MD  ?Patient coming from: Home ? ?Chief Complaint:  ?Chief Complaint  ?Patient presents with  ? Weakness  ? ?HPI: Keith [name] is a 56 y.o. male with medical history significant of anxiety, chronic combined systolic and diastolic heart failure, type II DM, history of GI bleed while being treated for pulmonary embolism, renal cell carcinoma with bone metastasis, left nephrectomy and partial right pneumonectomy, hypertension who is coming to the emergency department complaints of productive cough of whitish sputum that started about a month ago while he was fishing with a friend and has been persisting daily since then often triggering nausea, dry heaving and emesis.  He has had occasional pleuritic chest pain and wheezing.  He still smokes a cigarette every few days.  Has had a subjective fever and chills.  He has not been able to eat or drink much in the past 2 days, but his appetite has been decreased before that.  He has urinated very little over the past 2 days and first urinated today after being given fluid boluses in the emergency department.  He has been constipated for the past 6 days.  No dysuria, frequency or hematuria.  No melena or hematochezia. ? ?ED course: Initial vital signs were temperature 98.1 ?F, pulse 91, respiration 14, BP 115/88 mmHg and O2 sat 95% on room air.  The patient received fentanyl 25 mcg IVP, LR 1500 mL and iohexol 100 mL for CTA chest.  I added another 1000 mg of LR bolus, methylprednisolone 40 mg IVP, a DuoNeb and 40 mEq of potassium p.o. x1. ? ?Lab work: His urinalysis with small bilirubinuria and proteinuria more than 300 mg/dL and rare bacteria on microscopic examination.  CBC with a white count of 5.4 with 56% neutrophils and 28% lymphocytes.  Hemoglobin 13.5 g/dL platelets 86.  Lactic acid was normal.   Negative influenza and coronavirus 2 PCR.  Troponin x2 and BNP were unremarkable.  Lipase 21 units/L. ? ?Imaging: A portable 1 view chest radiograph did not show any acute cardiopulmonary disease.  No acute intracranial process on CT head.  CTA chest with possible small eccentric segmental pulmonary embolism to the right lower lobe, but more likely an extrinsic compression on the segmental pulmonary artery.  No other evidence of PE.  Stable ascending aorta distention.  CT abdomen/pelvis with contrast no acute findings.  No evidence of new or progressive metastatic disease.  There has been interval decrease on the pancreatic body mass.  Stable skeletal metastatic lesions.  There was aortic atherosclerosis. ?  ?Review of Systems: As mentioned in the history of present illness. All other systems reviewed and are negative. ?Past Medical History:  ?Diagnosis Date  ? Anemia   ? Anxiety   ? CHF (congestive heart failure) ([place])   ? Diabetes mellitus without complication ([place])   ? diet controlled only - wife denies this  ? GI bleed   ? Hypertension   ? left renal ca d'd 2008  ? left kidney removed  ? 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 met to lung 2009  ? part of right lung removed  ? Metastasis to bone [facility]) 2014/2016  ? Pulmonary embolus ([place])   ? ?Past Surgical History:  ?Procedure Laterality Date  ? BIOPSY  08/28/2019  ? Procedure: BIOPSY;  Surgeon: Thornton Park, MD;  Location: [facility];  Service: Gastroenterology;;  ? BIOPSY  03/01/2020  ? Procedure: BIOPSY;  Surgeon: Mauri Pole, MD;  Location: [facility] ENDOSCOPY;  Service: Endoscopy;;  ? BIOPSY  12/19/2020  ? Procedure: BIOPSY;  Surgeon: Yetta Flock, MD;  Location: [facility];  Service: Gastroenterology;;  ? BIOPSY  12/20/2020  ? Procedure: BIOPSY;  Surgeon: Gatha Mayer, MD;  Location: [facility];  Service: Endoscopy;;  ? COLONOSCOPY W/ POLYPECTOMY    ? COLONOSCOPY WITH PROPOFOL N/A 03/01/2020  ? Procedure: COLONOSCOPY  WITH PROPOFOL;  Surgeon: Mauri Pole, MD;  [facility]OS[facility];  Service: Endoscopy;  Laterality: N/A;  ? COLONOSCOPY WITH PROPOFOL N/A 12/20/2020  ? Procedure: COLONOSCOPY WITH PROPOFOL;  Surgeon: Gatha Mayer, MD;  [facility];  Service: Endoscopy;  Laterality: N/A;  ? ESOPHAGOGASTRODUODENOSCOPY (EGD) WITH PROPOFOL N/A 08/28/2019  ? Procedure: ESOPHAGOGASTRODUODENOSCOPY (EGD) WITH PROPOFOL;  Surgeon: Thornton Park, MD;  [facility];  Service: Gastroenterology;  Laterality: N/A;  ? ESOPHAGOGASTRODUODENOSCOPY (EGD) WITH PROPOFOL N/A 03/01/2020  ? Procedure: ESOPHAGOGASTRODUODENOSCOPY (EGD) WITH PROPOFOL;  Surgeon: Mauri Pole, MD;  [facility];  Service: Endoscopy;  Laterality: N/A;  ? ESOPHAGOGASTRODUODENOSCOPY (EGD) WITH PROPOFOL N/A 12/19/2020  ? Procedure: ESOPHAGOGASTRODUODENOSCOPY (EGD) WITH PROPOFOL;  Surgeon: Yetta Flock, MD;  [facility];  Service: Gastroenterology;  Laterality: N/A;  ? FEMUR IM NAIL Right 02/16/2018  ? Procedure: INTRAMEDULLARY (IM) NAIL RIGHT FEMORAL;  Surgeon: Paralee Cancel, MD;  [facility] ORS;  Service: Orthopedics;  Laterality: Right;  ? FRACTURE SURGERY Left 2022  ? arm  ? GIVENS CAPSULE STUDY N/A 03/03/2020  ? Procedure: GIVENS CAPSULE STUDY;  Surgeon: Mauri Pole, MD;  [facility];  Service: Endoscopy;  Laterality: N/A;  ? HAND SURGERY Right   ? HEMOSTASIS CLIP PLACEMENT  12/20/2020  ? Procedure: HEMOSTASIS CLIP PLACEMENT;  Surgeon: Gatha Mayer, MD;  [facility];  Service: Endoscopy;;  ? HOT HEMOSTASIS N/A 12/20/2020  ? Procedure: HOT HEMOSTASIS (ARGON PLASMA COAGULATION/BICAP);  Surgeon: Gatha Mayer, MD;  Location[facility];  Service: Endoscopy;  Laterality: N/A;  ? HUMERUS IM NAIL Left 04/04/2021  ? Procedure: INTRAMEDULLARY (IM) NAIL HUMERUS;  Surgeon: Nicholes Stairs, MD;  Location: [place];  Service: Orthopedics;  Laterality: Left;  2 hrs ?Need   Interventional radiology for immobilization of renal cell tumor- to be done same day prior to [place]  ? INTRATHECAL PUMP IMPLANT Left 01/02/2022  ? Procedure: Pain pump battery replacement, Left;  Surgeon: Dawley, Theodoro Doing, DO;  Location: [place];  Service: Neurosurgery;  Laterality: Left;  ? IR ANGIOGRAM EXTREMITY LEFT  04/04/2021  ? IR ANGIOGRAM EXTREMITY RIGHT  02/15/2018  ? IR ANGIOGRAM SELECTIVE EACH ADDITIONAL VESSEL  02/15/2018  ? IR ANGIOGRAM SELECTIVE EACH ADDITIONAL VESSEL  02/15/2018  ? IR ANGIOGRAM SELECTIVE EACH ADDITIONAL VESSEL  02/15/2018  ? IR ANGIOGRAM SELECTIVE EACH ADDITIONAL VESSEL  02/15/2018  ? IR ANGIOGRAM SELECTIVE EACH ADDITIONAL VESSEL  04/04/2021  ? IR ANGIOGRAM SELECTIVE EACH ADDITIONAL VESSEL  04/04/2021  ? IR EMBO TUMOR ORGAN ISCHEMIA INFARCT INC GUIDE ROADMAPPING  02/15/2018  ? IR EMBO TUMOR ORGAN ISCHEMIA INFARCT INC GUIDE ROADMAPPING  02/15/2018  ? IR EMBO TUMOR ORGAN ISCHEMIA INFARCT INC GUIDE ROADMAPPING  04/04/2021  ? IR US GUIDE VASC ACCESS LEFT  02/15/2018  ? IR US GUIDE VASC ACCESS RIGHT  04/04/2021  ? LUNG REMOVAL, PARTIAL Right 09/21/2008  ? NEPHRECTOMY RADICAL    ? Left   ? PAIN PUMP IMPLANTATION N/A 05/16/2015  ?  Procedure: Intrathecal pain pump placement;  Surgeon: Clydell Hakim, MD;  Location: [place] NEURO ORS;  Service: Neurosurgery;  Laterality: N/A;  Intrathecal pain pump placement  ? ?Social History:  reports that he has been smoking cigarettes. He has a 30.00 pack-year smoking history. He has never used smokeless tobacco. He reports current drug use. Drug: Marijuana. He reports that he does not drink alcohol. ? ?Allergies  ?Allergen Reactions  ? Ceftriaxone Hives  ?  Other reaction(s): red streaks ?  ? Hydrocodone Swelling  ? ? ?Family History  ?Problem Relation Age of Onset  ? Hypertension Mother   ? Diabetes Brother   ? Irritable bowel syndrome Sister   ? Cancer Maternal Aunt   ?     ovarian  ? Colon cancer Neg Hx   ? ? ?Prior to Admission medications   ?Medication Sig  Start Date End Date Taking? Authorizing Provider  ?acetaminophen (TYLENOL) 500 MG tablet Take 500 mg by mouth every 6 (six) hours as needed for headache.   Yes [provider]  ?ALPRAZolam Duanne Moron) 1 MG

## 2022-03-16 NOTE — ED Provider Notes (Signed)
?[place] DEPT ?Provider Note ? ? ?CSN: [id number] ?Arrival date & time: 03/16/22  1130 ? ?  ? ?History ? ?Chief Complaint  ?Patient presents with  ? Weakness  ? ? ?Keith [name] is a 56 y.o. male. ? ?HPI ? ?  ? ?56yo male with history of CHF with EF 40-45%, stage IV metastatic renal cancer, history of PE no longer on anticoagulation due prior GI bleeds /anemia, hypertension, DM, who presents with concern for generalized weakness, vomiting, dyspnea.  ? ?1 month ago was out fishing with a friend, had a coughing spell, couldn't breath, started throwing up ?Since then hasnt been right, sore all over, weak, still throwing up 3 times a week, coughing to the point of vomiting, shortness of breath which waxes and wanes, worse with walking, no orthopnea ?Tumors in legs, no change in leg pain or swelling  ?Chest pain, mostly with coughing and worse in mornings, hard to say quality of pain, not exertional chest pain but not able to do much because of weakness ?  ?No black or bloody stools, last week had one dark emesis, coeffe grounds ?No fever ?Abdominal pain ?Constipated, 5-6 days, is on pain medication, taking stool softeners ? ?No change in medicine recently ?Smoking, no etoh or other drugs  ? ?Hx of PE, was on anticoagulation but had a GI bleed ? ? ? ?Past Medical History:  ?Diagnosis Date  ? Anemia   ? Anxiety   ? CHF (congestive heart failure) ([place])   ? Diabetes mellitus without complication ([place])   ? diet controlled only - wife denies this  ? GI bleed   ? Hypertension   ? left renal ca d'd 2008  ? left kidney removed  ? 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 met to lung 2009  ? part of right lung removed  ? Metastasis to bone [facility]) 2014/2016  ? Pulmonary embolus ([place])   ?  ? ?Home Medications ?Prior to Admission medications   ?Medication Sig Start Date End Date Taking? Authorizing Provider  ?acetaminophen (TYLENOL) 500 MG tablet Take 500 mg by mouth every 6 (six) hours  as needed for headache.   Yes [provider]  ?ALPRAZolam (XANAX) 1 MG tablet TAKE 1 TABLET BY MOUTH EVERY 8 HOURS AS NEEDED. ?Patient taking differently: Take 1 mg by mouth every 8 (eight) hours as needed for anxiety or sleep. 02/25/22  Yes [name], MD  ?carvedilol (COREG) 12.5 MG tablet TAKE 1 TABLET (12.5 MG TOTAL) BY MOUTH 2 (TWO) TIMES DAILY WITH A MEAL. 11/04/21  Yes [name], [name] Freer, MD  ?Cholecalciferol (VITAMIN D3) 125 MCG (5000 UT) CAPS Take 5,000 Units by mouth every morning.   Yes [provider]  ?diphenhydrAMINE (BENADRYL) 25 MG tablet Take 25 mg by mouth 2 (two) times daily as needed for allergies.   Yes [provider]  ?ferrous sulfate 325 (65 FE) MG tablet TAKE 1 TABLET BY MOUTH EVERY DAY WITH BREAKFAST ?Patient taking differently: 325 mg every morning. 07/15/21  Yes Zehr, Laban Emperor, PA-C  ?furosemide (LASIX) 20 MG tablet Take 1 tablet (20 mg total) by mouth every other day. 05/31/21  Yes [name], [name] Freer, MD  ?Glycerin-Hypromellose-PEG 400 ([employer] DRY EYE RELIEF) 0.2-0.2-1 % SOLN Place 1 drop into both eyes daily as needed (Dry eye).   Yes [provider]  ?hydrALAZINE (APRESOLINE) 25 MG tablet TAKE 1 TABLET BY MOUTH THREE TIMES A DAY ?Patient taking differently: Take 25 mg by mouth  3 (three) times daily. 02/24/22  Yes [name], [name] Freer, MD  ?HYDROmorphone (DILAUDID) 8 MG tablet Take 8 mg by mouth 3 (three) times daily.   Yes [provider]  ?lenvatinib 20 mg daily dose (LENVIMA, 20 MG DAILY DOSE,) 2 x 10 MG capsule Take 2 capsules (20 mg total) by mouth daily. 03/04/22  Yes [name], MD  ?MORPHINE SULFATE PO 1 each by Intrathecal route See admin instructions. Continuous via intrathecal pump - pt may also administer via push every 4 hours as needed for pain. Becker, PA   Yes [provider]  ?ondansetron (ZOFRAN) 4 MG tablet TAKE ONE TABLET EVERY MORNING. CAN TAKE ONE TABLET LATER IN THE  DAY ON AN AS NEEDED BASIS ?Patient taking differently: Take 4 mg by mouth See admin instructions. Take one tablet  (4 mg) by mouth every morning.  May also take one tablet (4 mg) later in the day as needed for nausea/vomiting 12/17/21  Yes [name], MD  ?pantoprazole (PROTONIX) 40 MG tablet Take 1 tablet (40 mg total) by mouth daily. 12/21/20  Yes Oswald Hillock, MD  ?sodium zirconium cyclosilicate (LOKELMA) 10 g PACK packet Take 10 g by mouth every morning.   Yes [provider]  ?tiZANidine (ZANAFLEX) 4 MG tablet Take 4 mg by mouth 3 (three) times daily as needed for muscle spasms.   Yes [provider]  ?   ? ?Allergies    ?Ceftriaxone and Hydrocodone   ? ?Review of Systems   ?Review of Systems ?See above ? ?Physical Exam ?Updated Vital Signs ?BP (!) 143/95 (BP Location: Right Arm)   Pulse 73   Temp 98.2 ?F (36.8 ?C) (Oral)   Resp 18   Ht _0  (1.905 m)   Wt 78.3 kg   SpO2 94%   BMI 21.58 kg/m?  ?Physical Exam ?Vitals and nursing note reviewed.  ?Constitutional:   ?   General: He is not in acute distress. ?   Appearance: He is well-developed. He is not diaphoretic.  ?HENT:  ?   Head: Normocephalic and atraumatic.  ?Eyes:  ?   Conjunctiva/sclera: Conjunctivae normal.  ?Cardiovascular:  ?   Rate and Rhythm: Normal rate and regular rhythm.  ?   Heart sounds: Normal heart sounds. No murmur heard. ?  No friction rub. No gallop.  ?Pulmonary:  ?   Effort: Pulmonary effort is normal. No respiratory distress.  ?   Breath sounds: Normal breath sounds. No wheezing or rales.  ?Abdominal:  ?   General: There is no distension.  ?   Palpations: Abdomen is soft.  ?   Tenderness: There is abdominal tenderness. There is no guarding.  ?Musculoskeletal:  ?   Cervical back: Normal range of motion.  ?Skin: ?   General: Skin is warm and dry.  ?Neurological:  ?   Mental Status: He is alert and oriented to person, place, and time.  ? ? ?ED Results / Procedures / Treatments   ?Labs ?(all labs ordered are  listed, but only abnormal results are displayed) ?Labs Reviewed  ?CBC WITH DIFFERENTIAL/PLATELET - Abnormal; Notable for the following components:  ?    Result Value  ? Platelets 86 (*)   ? All other components within normal limits  ?COMPREHENSIVE METABOLIC PANEL - Abnormal; Notable for the following components:  ? Sodium 133 (*)   ? Potassium 3.2 (*)   ? Chloride 95 (*)   ? Glucose, Bld 109 (*)   ?  Creatinine, Ser 2.35 (*)   ? Calcium 8.2 (*)   ? Albumin 2.9 (*)   ? AST 43 (*)   ? Alkaline Phosphatase 129 (*)   ? GFR, Estimated 32 (*)   ? All other components within normal limits  ?URINALYSIS, ROUTINE W REFLEX MICROSCOPIC - Abnormal; Notable for the following components:  ? Bilirubin Urine SMALL (*)   ? Protein, ur >300 (*)   ? All other components within normal limits  ?URINALYSIS, MICROSCOPIC (REFLEX) - Abnormal; Notable for the following components:  ? Bacteria, UA RARE (*)   ? All other components within normal limits  ?COMPREHENSIVE METABOLIC PANEL - Abnormal; Notable for the following components:  ? Sodium 131 (*)   ? Glucose, Bld 167 (*)   ? BUN 21 (*)   ? Creatinine, Ser 2.10 (*)   ? Calcium 7.7 (*)   ? Total Protein 6.0 (*)   ? Albumin 2.5 (*)   ? GFR, Estimated 37 (*)   ? All other components within normal limits  ?GLUCOSE, CAPILLARY - Abnormal; Notable for the following components:  ? Glucose-Capillary 131 (*)   ? All other components within normal limits  ?RESP PANEL BY RT-PCR (FLU A&B, COVID) [id number]  ?CULTURE, BLOOD (ROUTINE X 2)  ?CULTURE, BLOOD (ROUTINE X 2)  ?MRSA NEXT GEN BY PCR, NASAL  ?BRAIN NATRIURETIC PEPTIDE  ?LIPASE, BLOOD  ?LACTIC ACID, PLASMA  ?HEMOGLOBIN A1C  ?HIV ANTIBODY (ROUTINE TESTING W REFLEX)  ?TROPONIN I (HIGH SENSITIVITY)  ?TROPONIN I (HIGH SENSITIVITY)  ? ? ?EKG ?EKG Interpretation ? ?Date/Time:  Sunday March 16 2022 11:39:50 EDT ?Ventricular Rate:  93 ?PR Interval:  156 ?QRS Duration: 100 ?QT Interval:  349 ?QTC Calculation: 435 ?R Axis:   78 ?Text Interpretation: Sinus rhythm  Ventricular premature complex LVH with secondary repolarization abnormality ST depr, consider ischemia, inferior leads New? diffuse ST depressions, elevation aVR Confirmed by Gareth Morgan [phone]) on 03/17/19

## 2022-03-17 ENCOUNTER — Observation Stay (HOSPITAL_COMMUNITY): Payer: Medicare Other

## 2022-03-17 DIAGNOSIS — C7889 Secondary malignant neoplasm of other digestive organs: Secondary | ICD-10-CM | POA: Diagnosis present

## 2022-03-17 DIAGNOSIS — E46 Unspecified protein-calorie malnutrition: Secondary | ICD-10-CM | POA: Diagnosis present

## 2022-03-17 DIAGNOSIS — D6959 Other secondary thrombocytopenia: Secondary | ICD-10-CM | POA: Diagnosis present

## 2022-03-17 DIAGNOSIS — Z8041 Family history of malignant neoplasm of ovary: Secondary | ICD-10-CM | POA: Diagnosis not present

## 2022-03-17 DIAGNOSIS — F419 Anxiety disorder, unspecified: Secondary | ICD-10-CM | POA: Diagnosis present

## 2022-03-17 DIAGNOSIS — E119 Type 2 diabetes mellitus without complications: Secondary | ICD-10-CM | POA: Diagnosis present

## 2022-03-17 DIAGNOSIS — E860 Dehydration: Secondary | ICD-10-CM | POA: Diagnosis present

## 2022-03-17 DIAGNOSIS — R809 Proteinuria, unspecified: Secondary | ICD-10-CM | POA: Diagnosis present

## 2022-03-17 DIAGNOSIS — F17210 Nicotine dependence, cigarettes, uncomplicated: Secondary | ICD-10-CM | POA: Diagnosis present

## 2022-03-17 DIAGNOSIS — N179 Acute kidney failure, unspecified: Secondary | ICD-10-CM | POA: Diagnosis present

## 2022-03-17 DIAGNOSIS — M5417 Radiculopathy, lumbosacral region: Secondary | ICD-10-CM | POA: Diagnosis present

## 2022-03-17 DIAGNOSIS — I110 Hypertensive heart disease with heart failure: Secondary | ICD-10-CM | POA: Diagnosis present

## 2022-03-17 DIAGNOSIS — L89151 Pressure ulcer of sacral region, stage 1: Secondary | ICD-10-CM | POA: Diagnosis present

## 2022-03-17 DIAGNOSIS — E871 Hypo-osmolality and hyponatremia: Secondary | ICD-10-CM | POA: Diagnosis present

## 2022-03-17 DIAGNOSIS — K5900 Constipation, unspecified: Secondary | ICD-10-CM | POA: Diagnosis present

## 2022-03-17 DIAGNOSIS — Z8249 Family history of ischemic heart disease and other diseases of the circulatory system: Secondary | ICD-10-CM | POA: Diagnosis not present

## 2022-03-17 DIAGNOSIS — R112 Nausea with vomiting, unspecified: Secondary | ICD-10-CM | POA: Diagnosis present

## 2022-03-17 DIAGNOSIS — C7951 Secondary malignant neoplasm of bone: Secondary | ICD-10-CM | POA: Diagnosis present

## 2022-03-17 DIAGNOSIS — G893 Neoplasm related pain (acute) (chronic): Secondary | ICD-10-CM | POA: Diagnosis present

## 2022-03-17 DIAGNOSIS — R822 Biliuria: Secondary | ICD-10-CM | POA: Diagnosis present

## 2022-03-17 DIAGNOSIS — I5042 Chronic combined systolic (congestive) and diastolic (congestive) heart failure: Secondary | ICD-10-CM | POA: Diagnosis present

## 2022-03-17 DIAGNOSIS — E876 Hypokalemia: Secondary | ICD-10-CM | POA: Diagnosis present

## 2022-03-17 DIAGNOSIS — R531 Weakness: Secondary | ICD-10-CM | POA: Diagnosis present

## 2022-03-17 DIAGNOSIS — I771 Stricture of artery: Secondary | ICD-10-CM | POA: Diagnosis present

## 2022-03-17 DIAGNOSIS — Z20822 Contact with and (suspected) exposure to covid-19: Secondary | ICD-10-CM | POA: Diagnosis present

## 2022-03-17 LAB — GLUCOSE, CAPILLARY
Glucose-Capillary: 198 mg/dL — ABNORMAL HIGH (ref 70–99)
Glucose-Capillary: 209 mg/dL — ABNORMAL HIGH (ref 70–99)
Glucose-Capillary: 168 mg/dL — ABNORMAL HIGH (ref 70–99)
Glucose-Capillary: 169 mg/dL — ABNORMAL HIGH (ref 70–99)

## 2022-03-17 LAB — MRSA NEXT GEN BY PCR, NASAL: MRSA by PCR Next Gen: NOT DETECTED

## 2022-03-17 LAB — COMPREHENSIVE METABOLIC PANEL
ALT: 17 U/L (ref 0–44)
AST: 30 U/L (ref 15–41)
Albumin: 2.5 g/dL — ABNORMAL LOW (ref 3.5–5.0)
Alkaline Phosphatase: 93 U/L (ref 38–126)
Anion gap: 8 (ref 5–15)
BUN: 21 mg/dL — ABNORMAL HIGH (ref 6–20)
CO2: 24 mmol/L (ref 22–32)
Calcium: 7.7 mg/dL — ABNORMAL LOW (ref 8.9–10.3)
Chloride: 99 mmol/L (ref 98–111)
Creatinine, Ser: 2.1 mg/dL — ABNORMAL HIGH (ref 0.61–1.24)
GFR, Estimated: 37 mL/min — ABNORMAL LOW (ref >60)
Glucose, Bld: 167 mg/dL — ABNORMAL HIGH (ref 70–99)
Potassium: 4.3 mmol/L (ref 3.5–5.1)
Sodium: 131 mmol/L — ABNORMAL LOW (ref 135–145)
Total Bilirubin: 0.7 mg/dL (ref 0.3–1.2)
Total Protein: 6 g/dL — ABNORMAL LOW (ref 6.5–8.1)

## 2022-03-17 LAB — HIV ANTIBODY (ROUTINE TESTING W REFLEX): HIV Screen 4th Generation wRfx: NONREACTIVE

## 2022-03-17 LAB — HEMOGLOBIN A1C
Hgb A1c MFr Bld: 5.4 % (ref 4.8–5.6)
Mean Plasma Glucose: 108.28 mg/dL

## 2022-03-17 MED ORDER — IPRATROPIUM-ALBUTEROL 0.5-2.5 (3) MG/3ML IN SOLN: 3.0000 mL | RESPIRATORY_TRACT | Status: DC | PRN

## 2022-03-17 MED ORDER — SODIUM CHLORIDE 0.9 % IV SOLN: INTRAVENOUS | Status: DC

## 2022-03-17 MED ORDER — ORAL CARE MOUTH RINSE
15.0000 mL | Freq: Two times a day (BID) | OROMUCOSAL | Status: DC
  Administered 2022-03-17 (×2): 15 mL via OROMUCOSAL

## 2022-03-17 NOTE — Progress Notes (Signed)
?PROGRESS NOTE ? ? ? ?Keith [name]  NOM:[id number] DOB: [birth year] DOA: 03/16/2022 ?PCP: [name], MD  ? ?Brief Narrative: 56 year old male with history of combined systolic and diastolic heart failure, left nephrectomy and partial right pneumonectomy, renal cell carcinoma with bony mets, hypertension, history of PE, history of GI bleed admitted with nausea vomiting and constipation.  He was found to be dehydrated with elevated creatinine.  COVID-negative.  Influenza negative.  CT angiogram of the chest with eccentric segmental pulmonary embolism but most likely an extrinsic compression of the segmental pulmonary artery.  No other evidence of PE.  CT abdomen and pelvis shows no acute findings.  Previously noted tiny liver lesions are not visualized on the current exam.  Interval decrease in the size of the pancreatic body mass now measuring 2 x 1.2 cm.  No evidence of new or progressive metastatic disease. ? ?Assessment & Plan: ?  ?Principal Problem: ?  AKI (acute kidney injury) ([place]) ?Active Problems: ?  Thrombocytopenia due to drugs ?  Type 2 diabetes mellitus ([place]) ?  Hyponatremia ?  Malnutrition, calorie ([place]) ?  Pulmonary emphysema ([place]) ?  History of pulmonary embolus (PE) ?  Lumbosacral radiculopathy ?  Chronic pain after cancer treatment ?  Chronic combined systolic and diastolic heart failure ([place]) ?  Hypokalemia ? ?#1 AKI creatinine improving with IV fluids.  Creatinine not yet back to his baseline.  Creatinine 2.10 from 2.35 on admission.  His baseline creatinine is around 1.3. ?Continue IV fluids and avoid hypotension and nephrotoxins. ?Hold Lasix ? ?#2 hyponatremia/hypokalemia-replete electrolytes and recheck labs. ? ?#3 thrombocytopenia platelets 86 down from 121 not on any anticoagulation monitor closely. ? ?#4 type 2 diabetes diet controlled.  Not on any medications on admission.  Hemoglobin A1c is normal. ? ?#5 malnutrition-dietary consult. ? ?#6 history of PE not on anticoagulation due to GI  bleed ?CT chest this admission was officially read as possible PE but not confirmed.  Likely extrinsic compression of the pulmonary artery.  Patient is on room air with normal saturation.  Not tachycardic. ? ?#7 chronic combined systolic diastolic heart failure on IV fluids monitor closely. ? ?#8 chronic pain patient has history of renal cell carcinoma with mets and chronic pain.  He has a Medtronic intrathecal morphine pump.  This pump was replaced in January 2023. ? 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 ? ? ?Pressure Injury 03/16/22 Coccyx Medial;Left;Right Stage 1 -  Intact skin with non-blanchable redness of a localized area usually over a bony prominence. (Active)  ?03/16/22 1730  ?Location: Coccyx  ?Location Orientation: Medial;Left;Right  ?Staging: Stage 1 -  Intact skin with non-blanchable redness of a localized area usually over a bony prominence.  ?Wound Description (Comments):   ?Present on Admission:   ?Dressing Type Foam - Lift dressing to assess site every shift 03/16/22 2040  ? ? ?Estimated body mass index is 21.58 kg/m? as calculated from the following: ?  Height as of this encounter: 6\' 3"  (1.905 m). ?  Weight as of this encounter: 78.3 kg. ? ?DVT prophylaxis: None due to thrombocytopenia  ?code Status: Full code ?Family Communication: None at bedside  ?disposition Plan:  Status is: Inpatient ?The patient will require care spanning > 2 midnights and should be moved to inpatient because: IV fluids ?  ?Consultants:  ?None ? ?Procedures: None ?Antimicrobials: None ? ?Subjective: ?Patient is resting in bed in no  acute distress he reports feeling better but not back to baseline continues to be nauseous ? ?Objective: ?Vitals:  ? 03/16/22 1959 03/17/22 0159 03/17/22 7425 03/17/22 0848  ?BP:  (!) 137/97 (!) 143/95   ?Pulse:  78 73   ?Resp:  18 18   ?Temp:  98.2 ?F (36.8 ?C) 98.2 ?F (36.8 ?C)    ?TempSrc:  Oral Oral   ?SpO2: 98% 97% 94% 97%  ?Weight:      ?Height:      ? ? ?Intake/Output Summary (Last 24 hours) at 03/17/2022 1054 ?Last data filed at 03/17/2022 9563 ?Gross per 24 hour  ?Intake 2537.88 ml  ?Output 1000 ml  ?Net 1537.88 ml  ? ?Filed Weights  ? 03/16/22 1728  ?Weight: 78.3 kg  ? ? ?Examination: ? ?General exam: Frail appears much older than stated age ?Respiratory system: Clear to auscultation. Respiratory effort normal. ?Cardiovascular system: S1 & S2 heard, RRR. No JVD, murmurs, rubs, gallops or clicks. No pedal edema. ?Gastrointestinal system: Abdomen is nondistended, soft and nontender. No organomegaly or masses felt. Normal bowel sounds heard.  Pain pump left lower quadrant ?Central nervous system: Alert and oriented. No focal neurological deficits. ?Extremities: Symmetric 5 x 5 power. ?Skin: No rashes, lesions or ulcers ?Psychiatry: Judgement and insight appear normal. Mood & affect appropriate.  ? ? ? ?Data Reviewed: I have personally reviewed following labs and imaging studies ? ?CBC: ?Recent Labs  ?Lab 03/16/22 ?1241  ?WBC 5.4  ?NEUTROABS 3.0  ?HGB 13.5  ?HCT 42.7  ?MCV 94.1  ?PLT 86*  ? ?Basic Metabolic Panel: ?Recent Labs  ?Lab 03/16/22 ?1241 03/17/22 ?8756  ?NA 133* 131*  ?K 3.2* 4.3  ?CL 95* 99  ?CO2 28 24  ?GLUCOSE 109* 167*  ?BUN 17 21*  ?CREATININE 2.35* 2.10*  ?CALCIUM 8.2* 7.7*  ? ?GFR: ?Estimated Creatinine Clearance: 44.5 mL/min (A) (by C-G formula based on SCr of 2.1 mg/dL (H)). ?Liver Function Tests: ?Recent Labs  ?Lab 03/16/22 ?1241 03/17/22 ?4332  ?AST 43* 30  ?ALT 20 17  ?ALKPHOS 129* 93  ?BILITOT 1.1 0.7  ?PROT 7.1 6.0*  ?ALBUMIN 2.9* 2.5*  ? ?Recent Labs  ?Lab 03/16/22 ?1241  ?LIPASE 21  ? ?No results for input(s): AMMONIA in the last 168 hours. ?Coagulation Profile: ?No results for input(s): INR, PROTIME in the last 168 hours. ?Cardiac Enzymes: ?No results for input(s): CKTOTAL, CKMB, CKMBINDEX, TROPONINI in the last 168 hours. ?BNP (last 3 results) ?No results for  input(s): PROBNP in the last 8760 hours. ?HbA1C: ?Recent Labs  ?  03/17/22 ?0455  ?HGBA1C 5.4  ? ?CBG: ?Recent Labs  ?Lab 03/16/22 ?2305 03/17/22 ?0745  ?GLUCAP 131* 198*  ? ?Lipid Profile: ?No results for input(s): CHOL, HDL, LDLCALC, TRIG, CHOLHDL, LDLDIRECT in the last 72 hours. ?Thyroid Function Tests: ?No results for input(s): TSH, T4TOTAL, FREET4, T3FREE, THYROIDAB in the last 72 hours. ?Anemia Panel: ?No results for input(s): VITAMINB12, FOLATE, FERRITIN, TIBC, IRON, RETICCTPCT in the last 72 hours. ?Sepsis Labs: ?Recent Labs  ?Lab 03/16/22 ?1241  ?LATICACIDVEN 1.5  ? ? ?Recent Results (from the past 240 hour(s))  ?Blood culture (routine x 2)     Status: None (Preliminary result)  ? Collection Time: 03/16/22 12:41 PM  ? Specimen: BLOOD  ?Result Value Ref Range Status  ? Specimen Description   Final  ?  BLOOD LEFT ANTECUBITAL ?Performed at [facility], [place] [address] [place], [place] [address] ?  ? Special Requests   Final  ?  BOTTLES DRAWN  AEROBIC AND ANAEROBIC Blood Culture adequate volume ?Performed at [facility], [place] [address] [place], [place] [address] ?  ? Culture   Final  ?  NO GROWTH < 24 HOURS ?Performed at [facility], [place] [address], [place], [place] [address] ?  ? Report Status PENDING  Incomplete  ?Resp Panel by RT-PCR (Flu A&B, Covid)     Status: None  ? Collection Time: 03/16/22 12:47 PM  ? Specimen: Nasopharyngeal(NP) swabs in vial transport medium  ?Result Value Ref Range Status  ? SARS Coronavirus 2 by RT PCR NEGATIVE NEGATIVE Final  ?  Comment: (NOTE) ?SARS-CoV-2 target nucleic acids are NOT DETECTED. ? ?The SARS-CoV-2 RNA is generally detectable in upper respiratory ?specimens during the acute phase of infection. The lowest ?concentration of SARS-CoV-2 viral copies this assay can detect is ?138 copies/mL. A negative result does not preclude SARS-Cov-2 ?infection and should not be used as the sole basis for treatment or ?other patient  management decisions. A negative result may occur with  ?improper specimen collection/handling, submission of specimen other ?than nasopharyngeal swab, presence of viral mutation(s) within the ?areas target

## 2022-03-18 ENCOUNTER — Other Ambulatory Visit (HOSPITAL_COMMUNITY): Payer: Self-pay

## 2022-03-18 ENCOUNTER — Telehealth: Payer: Self-pay | Admitting: Cardiovascular Disease

## 2022-03-18 DIAGNOSIS — G894 Chronic pain syndrome: Secondary | ICD-10-CM | POA: Diagnosis not present

## 2022-03-18 DIAGNOSIS — L8990 Pressure ulcer of unspecified site, unspecified stage: Secondary | ICD-10-CM | POA: Insufficient documentation

## 2022-03-18 LAB — COMPREHENSIVE METABOLIC PANEL
ALT: 12 U/L (ref 0–44)
AST: 20 U/L (ref 15–41)
Albumin: 2.4 g/dL — ABNORMAL LOW (ref 3.5–5.0)
Alkaline Phosphatase: 73 U/L (ref 38–126)
Anion gap: 4 — ABNORMAL LOW (ref 5–15)
BUN: 25 mg/dL — ABNORMAL HIGH (ref 6–20)
CO2: 28 mmol/L (ref 22–32)
Calcium: 7.6 mg/dL — ABNORMAL LOW (ref 8.9–10.3)
Chloride: 102 mmol/L (ref 98–111)
Creatinine, Ser: 1.63 mg/dL — ABNORMAL HIGH (ref 0.61–1.24)
GFR, Estimated: 50 mL/min — ABNORMAL LOW (ref >60)
Glucose, Bld: 143 mg/dL — ABNORMAL HIGH (ref 70–99)
Potassium: 3.9 mmol/L (ref 3.5–5.1)
Sodium: 134 mmol/L — ABNORMAL LOW (ref 135–145)
Total Bilirubin: 0.2 mg/dL — ABNORMAL LOW (ref 0.3–1.2)
Total Protein: 6 g/dL — ABNORMAL LOW (ref 6.5–8.1)

## 2022-03-18 LAB — CBC
HCT: 31.3 % — ABNORMAL LOW (ref 39.0–52.0)
Hemoglobin: 10 g/dL — ABNORMAL LOW (ref 13.0–17.0)
MCH: 30.7 pg (ref 26.0–34.0)
MCHC: 31.9 g/dL (ref 30.0–36.0)
MCV: 96 fL (ref 80.0–100.0)
Platelets: 70 10*3/uL — ABNORMAL LOW (ref 150–400)
RBC: 3.26 MIL/uL — ABNORMAL LOW (ref 4.22–5.81)
RDW: 15 % (ref 11.5–15.5)
WBC: 3.5 10*3/uL — ABNORMAL LOW (ref 4.0–10.5)
nRBC: 0 % (ref 0.0–0.2)

## 2022-03-18 LAB — GLUCOSE, CAPILLARY
Glucose-Capillary: 115 mg/dL — ABNORMAL HIGH (ref 70–99)
Glucose-Capillary: 93 mg/dL (ref 70–99)

## 2022-03-18 MED ORDER — PANTOPRAZOLE SODIUM 40 MG PO TBEC: 40.0000 mg | DELAYED_RELEASE_TABLET | Freq: Every day | ORAL | 3 refills | Status: AC

## 2022-03-18 NOTE — Telephone Encounter (Signed)
Pt c/o medication issue: ? ?1. Name of Medication: carvedilol (COREG) tablet 12.5 mg ? ?2. How are you currently taking this medication (dosage and times per day)?  ? ?3. Are you having a reaction (difficulty breathing--STAT)?  ? ?4. What is your medication issue?  Patient is calling stating he is being released from the hospital and is needing a refill on this medication. He reports [facility] advised him he cannot pick it up until 04/03, but he is already out. Please advise.  ?

## 2022-03-18 NOTE — Plan of Care (Signed)
?  Problem: Activity: ?Goal: Risk for activity intolerance will decrease ?Outcome: Adequate for Discharge ?  ?Problem: Nutrition: ?Goal: Adequate nutrition will be maintained ?Outcome: Adequate for Discharge ?  ?Problem: Pain Managment: ?Goal: General experience of comfort will improve ?Outcome: Adequate for Discharge ?  ?

## 2022-03-18 NOTE — Discharge Summary (Signed)
Physician Discharge Summary  ?Keith [name] NAT:[id number] DOB: Jan 10, 1966 DOA: 03/16/2022 ? ?PCP: [name], MD ? ?Admit date: 03/16/2022 ?Discharge date: 03/18/2022 ? ?Admitted From: Home ?Disposition: Home ? ?Recommendations for Outpatient Follow-up:  ?Follow up with PCP in 1-2 weeks ?Please obtain BMP/CBC in one week ?Please follow up with Dr. [name] and Dr. Posey Pronto nephrology ? ?Home Health: None ?Equipment/Devices: None ? ?Discharge Condition: Stable ?CODE STATUS: Full code ?Diet recommendation: Cardiac ?Brief/Interim Summary: ? 56 year old male with history of combined systolic and diastolic heart failure, left nephrectomy and partial right pneumonectomy, renal cell carcinoma with bony mets, hypertension, history of PE, history of GI bleed admitted with nausea vomiting and constipation.  He was found to be dehydrated with elevated creatinine.  COVID-negative.  Influenza negative.  CT angiogram of the chest with eccentric segmental pulmonary embolism but most likely an extrinsic compression of the segmental pulmonary artery.  No other evidence of PE.  CT abdomen and pelvis shows no acute findings.  Previously noted tiny liver lesions are not visualized on the current exam.  Interval decrease in the size of the pancreatic body mass now measuring 2 x 1.2 cm.  No evidence of new or progressive metastatic disease. ? ?Discharge Diagnoses:  ?Principal Problem: ?  AKI (acute kidney injury) ([place]) ?Active Problems: ?  Thrombocytopenia due to drugs ?  Type 2 diabetes mellitus ([place]) ?  Hyponatremia ?  Malnutrition, calorie ([place]) ?  Pulmonary emphysema ([place]) ?  History of pulmonary embolus (PE) ?  Lumbosacral radiculopathy ?  Chronic pain after cancer treatment ?  Chronic combined systolic and diastolic heart failure ([place]) ?  Hypokalemia ?  Pressure injury of skin ? ?#1 AKI due to dehydration nausea and vomiting.  Creatinine 1.63 from 2.35 on admission with IV fluids.  Lasix was held. ? ?#2 hyponatremia/hypokalemia-replete  repleted and resolved.  Sodium was 134 and potassium was 3.9 on discharge.  ? ?#3 thrombocytopenia platelets 70 on discharge  ?  ?#4 type 2 diabetes diet controlled.  Not on any medications on admission.  Hemoglobin A1c is normal. ?  ?#5 malnutrition-encouraged dietary intake of nutrition rich food. ?  ?#6 history of PE not on anticoagulation due to GI bleed ?CT chest this admission was officially read as possible PE but not confirmed.  Likely extrinsic compression of the pulmonary artery.  Patient is on room air with normal saturation.  Not tachycardic.  He remained on room air throughout the hospital stay without any complaints of shortness of breath or dyspnea on exertion. ?  ?#7 chronic combined systolic diastolic heart failure continue Lasix on discharge. ?  ?#8 chronic pain patient has history of renal cell carcinoma with mets and chronic pain.  He has a Medtronic intrathecal morphine pump.  This pump was replaced in January 2023. ?  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 ? ? ?Pressure Injury 03/16/22 Coccyx Medial;Left;Right Stage 1 -  Intact skin with non-blanchable redness of a localized area usually over a bony prominence. (Active)  ?03/16/22 1730  ?Location: Coccyx  ?Location Orientation: Medial;Left;Right  ?Staging: Stage 1 -  Intact skin with non-blanchable redness of a localized area usually over a bony prominence.  ?Wound Description (Comments):   ?Present on Admission:   ?Dressing Type Foam - Lift dressing to assess site every shift 03/17/22 2028  ? ? ?Estimated body mass index is 21.58 kg/m? as calculated from the following: ?  Height as of this encounter: 6\' 3"  (1.905 m). ?  Weight as of this encounter: 78.3 kg. ? ?Discharge Instructions ? ?Discharge Instructions   ? ? Diet - low sodium heart healthy   Complete by: As directed ?  ? Increase activity slowly   Complete by: As  directed ?  ? No wound care   Complete by: As directed ?  ? ?  ? ?Allergies as of 03/18/2022   ? ?   Reactions  ? Ceftriaxone Hives  ? Other reaction(s): red streaks  ? Hydrocodone Swelling  ? ?  ? ?  ?Medication List  ?  ? ?TAKE these medications   ? ?acetaminophen 500 MG tablet ?Commonly known as: TYLENOL ?Take 500 mg by mouth every 6 (six) hours as needed for headache. ?  ?ALPRAZolam 1 MG tablet ?Commonly known as: Duanne Moron ?TAKE 1 TABLET BY MOUTH EVERY 8 HOURS AS NEEDED. ?What changed: reasons to take this ?  ?carvedilol 12.5 MG tablet ?Commonly known as: COREG ?TAKE 1 TABLET (12.5 MG TOTAL) BY MOUTH 2 (TWO) TIMES DAILY WITH A MEAL. ?  ?[facility] Dry Eye Relief 0.2-0.2-1 % Soln ?Generic drug: Glycerin-Hypromellose-PEG 400 ?Place 1 drop into both eyes daily as needed (Dry eye). ?  ?diphenhydrAMINE 25 MG tablet ?Commonly known as: BENADRYL ?Take 25 mg by mouth 2 (two) times daily as needed for allergies. ?  ?ferrous sulfate 325 (65 FE) MG tablet ?TAKE 1 TABLET BY MOUTH EVERY DAY WITH BREAKFAST ?What changed: See the new instructions. ?  ?furosemide 20 MG tablet ?Commonly known as: LASIX ?Take 1 tablet (20 mg total) by mouth every other day. ?  ?hydrALAZINE 25 MG tablet ?Commonly known as: APRESOLINE ?TAKE 1 TABLET BY MOUTH THREE TIMES A DAY ?  ?HYDROmorphone 8 MG tablet ?Commonly known as: DILAUDID ?Take 8 mg by mouth 3 (three) times daily. ?  ?Lenvima (20 MG Daily Dose) 2 x 10 MG capsule ?Generic drug: lenvatinib 20 mg daily dose ?Take 2 capsules (20 mg total) by mouth daily. ?  ?Lokelma 10 g Pack packet ?Generic drug: sodium zirconium cyclosilicate ?Take 10 g by mouth every morning. ?  ?MORPHINE SULFATE PO ?1 each by Intrathecal route See admin instructions. Continuous via intrathecal pump - pt may also administer via push every 4 hours as needed for pain. Prescriber Nelwyn Salisbury Ardis Rowan, PA ?  ?ondansetron 4 MG tablet ?Commonly known as: ZOFRAN ?TAKE ONE TABLET EVERY MORNING. CAN TAKE ONE TABLET LATER IN THE DAY  ON AN AS NEEDED BASIS ?What changed: See the new instructions. ?  ?pantoprazole 40 MG tablet ?Commonly known as: PROTONIX ?Take 1 tablet (40 mg total) by mouth daily. ?  ?tiZANidine 4 MG tablet ?Commonly known as: ZANAFLEX ?Take 4 mg by mouth 3 (three) times daily as needed for muscle spasms. ?  ?Vitamin D3 125 MCG (5000 UT) Caps ?Take 5,000 Units by mouth every morning. ?  ? ?  ? ? Follow-up Information   ? ? [name], MD Follow up.   ?Specialty: Internal Medicine ?Contact information: ?[place] [address] ?[phone] ? ? ?  ?  ? ? Geralynn Rile, MD .   ?Specialties: Cardiology, Internal Medicine, Radiology ?Contact information: ?[place] ?[place] [address] ?[phone] ? ? ?  ?  ? ?  ?  ? ?  ? ?Allergies  ?Allergen Reactions  ? Ceftriaxone Hives  ?  Other reaction(s): red streaks ?  ? Hydrocodone Swelling  ? ? ?Consultations: ?None ? ? ?  Procedures/Studies: ?DG Abd 1 View ? ?Result Date: 03/17/2022 ?CLINICAL DATA:  Nausea, vomiting EXAM: ABDOMEN - 1 VIEW COMPARISON:  Previous abdominal radiograph done on 03/05/2020, CT done on 03/16/2022 FINDINGS: Bowel gas pattern is nonspecific. No abnormal masses or calcifications are seen. Kidneys are partly obscured by bowel contents. There is contrast in the urinary bladder from previous CT. An electronic device, possibly a pump in the left lower quadrant of abdomen. There is previous internal fixation in the right proximal femur. Degenerative changes are noted in the left hip and lumbar spine. IMPRESSION: Nonspecific bowel gas pattern. Electronically Signed   By: Elmer Picker M.D.   On: 03/17/2022 12:12  ? ?CT Head Wo Contrast ? ?Result Date: 03/16/2022 ?CLINICAL DATA:  Headache EXAM: CT HEAD WITHOUT CONTRAST TECHNIQUE: Contiguous axial images were obtained from the base of the skull through the vertex without intravenous contrast. RADIATION DOSE REDUCTION: This exam was performed according to the departmental  dose-optimization program which includes automated exposure control, adjustment of the mA and/or kV according to patient size and/or use of iterative reconstruction technique. COMPARISON:  CT head 05/04/201

## 2022-03-18 NOTE — Telephone Encounter (Signed)
Pt aware script is available and is good  through  11/23 of this year  Pt instructed to let pharmacist know is out of medicine and may need to pay out of pocket for pills Pt verbalized understanding ./cy ?

## 2022-03-21 LAB — CULTURE, BLOOD (ROUTINE X 2)
Culture: NO GROWTH
Culture: NO GROWTH
Special Requests: ADEQUATE

## 2022-03-26 DIAGNOSIS — C649 Malignant neoplasm of unspecified kidney, except renal pelvis: Secondary | ICD-10-CM | POA: Diagnosis not present

## 2022-03-26 DIAGNOSIS — Z515 Encounter for palliative care: Secondary | ICD-10-CM | POA: Diagnosis not present

## 2022-03-26 NOTE — Progress Notes (Signed)
?Cardiology Office Note:   ? ?Date:  03/27/2022  ? ?ID:  Keith [name], DOB 1966/01/17, MRN [id number] ? ?PCP:  [name], MD ?[place] Cardiologist: Evalina Field, MD  ? ?Reason for visit: Hospital follow-up ? ?History of Present Illness:   ? ?Keith [name] is a 56 y.o. male with a hx of stage IV renal cancer on palliative chemotherapy, systolic HF, recurrent GI bleed, PE (not candidate for [facility] due to recurrent GI bleed/anemia, CKD with left nephrectomy and partial right pneumonectomy, renal cell carcinoma with bony mets, hypertension. ? ?He last saw Dr. Audie Box in December 2022.  He was continued on Lasix 20 mg every other day.  Dr. Audie Box was reluctant to add other agents given history of kidney injury with hyperkalemia. ? ?He was noted to the hospital in March 2023 with nausea, vomiting constipation.  Found dehydrated with AKI.  Creatinine 1.63 from 2.35 on admission with IV fluids.  Lasix was held.  He was discharged on March 28. ? ?He states since getting the IV fluids, he now has lower extremity edema.  He states his weight fluctuates between 165-174 pounds.  He denies orthopnea.  He has chronic shortness of breath with history of partial right pneumonectomy - not worsened.  He has decreased appetite he blames this on his palliative chemotherapy which he has been warned will only get worse.  He also complains about headache. ? ?  ?Past Medical History:  ?Diagnosis Date  ? Anemia   ? Anxiety   ? CHF (congestive heart failure) ([place])   ? Diabetes mellitus without complication ([place])   ? diet controlled only - wife denies this  ? GI bleed   ? Hypertension   ? left renal ca d'd 2008  ? left kidney removed  ? 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 met to lung 2009  ? part of right lung removed  ? Metastasis to bone [facility]) 2014/2016  ? Pulmonary embolus ([place])   ? ? ?Past Surgical History:  ?Procedure Laterality Date  ? BIOPSY  08/28/2019  ? Procedure: BIOPSY;  Surgeon: Thornton Park, MD;  Location: [facility];  Service: Gastroenterology;;  ? BIOPSY  03/01/2020  ? Procedure: BIOPSY;  Surgeon: Mauri Pole, MD;  Location: [facility];  Service: Endoscopy;;  ? BIOPSY  12/19/2020  ? Procedure: BIOPSY;  Surgeon: Yetta Flock, MD;  Location: [facility];  Service: Gastroenterology;;  ? BIOPSY  12/20/2020  ? Procedure: BIOPSY;  Surgeon: Gatha Mayer, MD;  Location: [facility];  Service: Endoscopy;;  ? COLONOSCOPY W/ POLYPECTOMY    ? COLONOSCOPY WITH PROPOFOL N/A 03/01/2020  ? Procedure: COLONOSCOPY WITH PROPOFOL;  Surgeon: Mauri Pole, MD;  [facility]: [facility];  Service: Endoscopy;  Laterality: N/A;  ? COLONOSCOPY WITH PROPOFOL N/A 12/20/2020  ? Procedure: COLONOSCOPY WITH PROPOFOL;  Surgeon: Gatha Mayer, MD;  [facility];  Service: Endoscopy;  Laterality: N/A;  ? ESOPHAGOGASTRODUODENOSCOPY (EGD) WITH PROPOFOL N/A 08/28/2019  ? Procedure: ESOPHAGOGASTRODUODENOSCOPY (EGD) WITH PROPOFOL;  Surgeon: Thornton Park, MD;  [facility];  Service: Gastroenterology;  Laterality: N/A;  ? ESOPHAGOGASTRODUODENOSCOPY (EGD) WITH PROPOFOL N/A 03/01/2020  ? Procedure: ESOPHAGOGASTRODUODENOSCOPY (EGD) WITH PROPOFOL;  Surgeon: Mauri Pole, MD;  [facility];  Service: Endoscopy;  Laterality: N/A;  ? ESOPHAGOGASTRODUODENOSCOPY (EGD) WITH PROPOFOL N/A 12/19/2020  ? Procedure: ESOPHAGOGASTRODUODENOSCOPY (EGD) WITH PROPOFOL;  Surgeon: Yetta Flock, MD;  [facility];  Service: Gastroenterology;  Laterality: N/A;  ? FEMUR  IM NAIL Right 02/16/2018  ? Procedure: INTRAMEDULLARY (IM) NAIL RIGHT FEMORAL;  Surgeon: Paralee Cancel, MD;  Location: [facility];  Service: Orthopedics;  Laterality: Right;  ? FRACTURE SURGERY Left 2022  ? arm  ? GIVENS CAPSULE STUDY N/A 03/03/2020  ? Procedure: GIVENS CAPSULE STUDY;  Surgeon: Mauri Pole, MD;  [facility]: [facility];  Service: Endoscopy;  Laterality: N/A;  ? HAND SURGERY Right    ? HEMOSTASIS CLIP PLACEMENT  12/20/2020  ? Procedure: HEMOSTASIS CLIP PLACEMENT;  Surgeon: Gatha Mayer, MD;  Location: [facility];  Service: Endoscopy;;  ? HOT HEMOSTASIS N/A 12/20/2020  ? Procedure: HOT HEMOSTASIS (ARGON PLASMA COAGULATION/BICAP);  Surgeon: Gatha Mayer, MD;  Location: [facility];  Service: Endoscopy;  Laterality: N/A;  ? HUMERUS IM NAIL Left 04/04/2021  ? Procedure: INTRAMEDULLARY (IM) NAIL HUMERUS;  Surgeon: Nicholes Stairs, MD;  Location: [place];  Service: Orthopedics;  Laterality: Left;  2 hrs ?Need  Interventional radiology for immobilization of renal cell tumor- to be done same day prior to [place]  ? INTRATHECAL PUMP IMPLANT Left 01/02/2022  ? Procedure: Pain pump battery replacement, Left;  Surgeon: Dawley, Theodoro Doing, DO;  Location: [place];  Service: Neurosurgery;  Laterality: Left;  ? IR ANGIOGRAM EXTREMITY LEFT  04/04/2021  ? IR ANGIOGRAM EXTREMITY RIGHT  02/15/2018  ? IR ANGIOGRAM SELECTIVE EACH ADDITIONAL VESSEL  02/15/2018  ? IR ANGIOGRAM SELECTIVE EACH ADDITIONAL VESSEL  02/15/2018  ? IR ANGIOGRAM SELECTIVE EACH ADDITIONAL VESSEL  02/15/2018  ? IR ANGIOGRAM SELECTIVE EACH ADDITIONAL VESSEL  02/15/2018  ? IR ANGIOGRAM SELECTIVE EACH ADDITIONAL VESSEL  04/04/2021  ? IR ANGIOGRAM SELECTIVE EACH ADDITIONAL VESSEL  04/04/2021  ? IR EMBO TUMOR ORGAN ISCHEMIA INFARCT INC GUIDE ROADMAPPING  02/15/2018  ? IR EMBO TUMOR ORGAN ISCHEMIA INFARCT INC GUIDE ROADMAPPING  02/15/2018  ? IR EMBO TUMOR ORGAN ISCHEMIA INFARCT INC GUIDE ROADMAPPING  04/04/2021  ? IR US GUIDE VASC ACCESS LEFT  02/15/2018  ? IR US GUIDE VASC ACCESS RIGHT  04/04/2021  ? LUNG REMOVAL, PARTIAL Right 09/21/2008  ? NEPHRECTOMY RADICAL    ? Left   ? PAIN PUMP IMPLANTATION N/A 05/16/2015  ? Procedure: Intrathecal pain pump placement;  Surgeon: Clydell Hakim, MD;  Location: [facility];  Service: Neurosurgery;  Laterality: N/A;  Intrathecal pain pump placement  ? ? ?Current Medications: ?Current Meds  ?Medication Sig  ?  acetaminophen (TYLENOL) 500 MG tablet Take 500 mg by mouth every 6 (six) hours as needed for headache.  ? ALPRAZolam (XANAX) 1 MG tablet TAKE 1 TABLET BY MOUTH EVERY 8 HOURS AS NEEDED. (Patient taking differently: Take 1 mg by mouth every 8 (eight) hours as needed for anxiety or sleep.)  ? carvedilol (COREG) 12.5 MG tablet TAKE 1 TABLET (12.5 MG TOTAL) BY MOUTH 2 (TWO) TIMES DAILY WITH A MEAL.  ? Cholecalciferol (VITAMIN D3) 125 MCG (5000 UT) CAPS Take 5,000 Units by mouth every morning.  ? diphenhydrAMINE (BENADRYL) 25 MG tablet Take 25 mg by mouth 2 (two) times daily as needed for allergies.  ? ferrous sulfate 325 (65 FE) MG tablet TAKE 1 TABLET BY MOUTH EVERY DAY WITH BREAKFAST (Patient taking differently: 325 mg every morning.)  ? furosemide (LASIX) 20 MG tablet Take 1 tablet (20 mg total) by mouth every other day.  ? Glycerin-Hypromellose-PEG 400 ([facility] DRY EYE RELIEF) 0.2-0.2-1 % SOLN Place 1 drop into both eyes daily as needed (Dry eye).  ? hydrALAZINE (APRESOLINE) 25 MG tablet TAKE 1 TABLET BY MOUTH THREE  TIMES A DAY (Patient taking differently: Take 25 mg by mouth 3 (three) times daily.)  ? HYDROmorphone (DILAUDID) 8 MG tablet Take 8 mg by mouth 3 (three) times daily.  ? lenvatinib 20 mg daily dose (LENVIMA, 20 MG DAILY DOSE,) 2 x 10 MG capsule Take 2 capsules (20 mg total) by mouth daily.  ? MORPHINE SULFATE PO 1 each by Intrathecal route See admin instructions. Continuous via intrathecal pump - pt may also administer via push every 4 hours as needed for pain. Prescriber Nelwyn Salisbury Ardis Rowan, PA  ? ondansetron (ZOFRAN) 4 MG tablet TAKE ONE TABLET EVERY MORNING. CAN TAKE ONE TABLET LATER IN THE DAY ON AN AS NEEDED BASIS (Patient taking differently: Take 4 mg by mouth See admin instructions. Take one tablet  (4 mg) by mouth every morning.  May also take one tablet (4 mg) later in the day as needed for nausea/vomiting)  ? pantoprazole (PROTONIX) 40 MG tablet Take 1 tablet (40 mg total) by mouth daily.   ? sodium zirconium cyclosilicate (LOKELMA) 10 g PACK packet Take 10 g by mouth every morning.  ? tiZANidine (ZANAFLEX) 4 MG tablet Take 4 mg by mouth 3 (three) times daily as needed for muscle spasms.  ?

## 2022-03-27 ENCOUNTER — Other Ambulatory Visit: Payer: Self-pay | Admitting: Oncology

## 2022-03-27 ENCOUNTER — Telehealth: Payer: Self-pay | Admitting: Cardiovascular Disease

## 2022-03-27 ENCOUNTER — Encounter: Payer: Self-pay | Admitting: Physician Assistant

## 2022-03-27 ENCOUNTER — Ambulatory Visit: Payer: Medicare Other | Admitting: Physician Assistant

## 2022-03-27 ENCOUNTER — Telehealth: Payer: Self-pay | Admitting: *Deleted

## 2022-03-27 VITALS — BP 136/88 | HR 70 | Ht 75.0 in | Wt 166.8 lb

## 2022-03-27 DIAGNOSIS — Z9689 Presence of other specified functional implants: Secondary | ICD-10-CM | POA: Diagnosis not present

## 2022-03-27 DIAGNOSIS — G894 Chronic pain syndrome: Secondary | ICD-10-CM | POA: Diagnosis not present

## 2022-03-27 DIAGNOSIS — Z5181 Encounter for therapeutic drug level monitoring: Secondary | ICD-10-CM | POA: Diagnosis not present

## 2022-03-27 DIAGNOSIS — I10 Essential (primary) hypertension: Secondary | ICD-10-CM | POA: Diagnosis not present

## 2022-03-27 DIAGNOSIS — I5023 Acute on chronic systolic (congestive) heart failure: Secondary | ICD-10-CM

## 2022-03-27 DIAGNOSIS — C649 Malignant neoplasm of unspecified kidney, except renal pelvis: Secondary | ICD-10-CM

## 2022-03-27 DIAGNOSIS — I5022 Chronic systolic (congestive) heart failure: Secondary | ICD-10-CM

## 2022-03-27 DIAGNOSIS — G620 Drug-induced polyneuropathy: Secondary | ICD-10-CM | POA: Diagnosis not present

## 2022-03-27 MED ORDER — ALPRAZOLAM 1 MG PO TABS: 1.0000 mg | ORAL_TABLET | Freq: Three times a day (TID) | ORAL | 0 refills | Status: AC | PRN

## 2022-03-27 MED ORDER — ALPRAZOLAM 1 MG PO TABS: 1.0000 mg | ORAL_TABLET | Freq: Three times a day (TID) | ORAL | 0 refills | Status: DC | PRN

## 2022-03-27 NOTE — Patient Instructions (Signed)
Medication Instructions:  ?Take Lasix 20 mg (1 Tablet Daily for 2-3 Days until swelling reduces.) Then Take Lasix 20 mg on Mondays and Thursdays. ?*If you need a refill on your cardiac medications before your next appointment, please call your pharmacy* ? ? ?Lab Work: ?Art gallery manager. To Be Done 2 weeks prior to Nephrology Appointment. ?If you have labs (blood work) drawn today and your tests are completely normal, you will receive your results only by: ?MyChart Message (if you have MyChart) OR ?A paper copy in the mail ?If you have any lab test that is abnormal or we need to change your treatment, we will call you to review the results. ? ? ?Testing/Procedures: ?None ? ? ?Follow-Up: ?At [facility], you and your health needs are our priority.  As part of our continuing mission to provide you with exceptional heart care, we have created designated Provider Care Teams.  These Care Teams include your primary Cardiologist (physician) and Advanced Practice Providers (APPs -  Physician Assistants and Nurse Practitioners) who all work together to provide you with the care you need, when you need it. ? ?We recommend signing up for the patient portal called "MyChart".  Sign up information is provided on this After Visit Summary.  MyChart is used to connect with patients for Virtual Visits (Telemedicine).  Patients are able to view lab/test results, encounter notes, upcoming appointments, etc.  Non-urgent messages can be sent to your provider as well.   ?To learn more about what you can do with MyChart, go to [email or web address].   ? ?Your next appointment:   ?3 month(s) ? ?The format for your next appointment:   ?In Person ? ?Provider:   ?Evalina Field, MD   ? ? ?Other Instructions ?Recommend wearing compression stockings daily.  ?

## 2022-03-27 NOTE — Telephone Encounter (Signed)
Keith [name] left a message requesting a refill of Xanax to [facility] in [place] ?

## 2022-03-27 NOTE — Telephone Encounter (Signed)
Notes faxed.

## 2022-03-27 NOTE — Telephone Encounter (Signed)
Melisa called in from [facility]ious care, they would like to know if they can get the notes faxed over from [place] visit  ? ?Fax number [phone] ?

## 2022-04-03 ENCOUNTER — Telehealth: Payer: Self-pay | Admitting: *Deleted

## 2022-04-03 ENCOUNTER — Other Ambulatory Visit (HOSPITAL_COMMUNITY): Payer: Self-pay

## 2022-04-03 NOTE — Telephone Encounter (Signed)
See documentation below

## 2022-04-03 NOTE — Telephone Encounter (Signed)
-----   Message from [name], MD sent at 04/03/2022  1:52 PM EDT ----- ?Regarding: RE: Transition to hospice care ?I agree with hospice if he is declining. I will try to call her now and convey this. Thanks ?----- Message ----- ?From: Rolene Course, RN ?Sent: 04/03/2022   1:47 PM EDT ?To: [name], MD ?Subject: Transition to hospice care                    ? ?A nurse case manager from [place] called today about this patient.  He is currently receiving palliative care, has rapidly declined, & she and the patient's wife feel he needs to transition to hospice care.  He seems to be near the end of life, according to the nurse.  However, the patient will not agree to stop treatment until you recommend this.  His wife, [name], is asking if you could possibly call her about this situation.  Please advise. ? ?Thanks, ?Bethena Roys ? ? ?

## 2022-04-07 ENCOUNTER — Other Ambulatory Visit (HOSPITAL_COMMUNITY): Payer: Self-pay

## 2022-04-14 ENCOUNTER — Other Ambulatory Visit (HOSPITAL_COMMUNITY): Payer: Self-pay

## 2022-04-18 ENCOUNTER — Inpatient Hospital Stay: Payer: Medicare Other

## 2022-04-18 ENCOUNTER — Inpatient Hospital Stay: Payer: Medicare Other | Admitting: Oncology

## 2022-04-18 DIAGNOSIS — C649 Malignant neoplasm of unspecified kidney, except renal pelvis: Secondary | ICD-10-CM

## 2022-04-21 DEATH — deceased

## 2022-04-23 IMAGING — CT CT ABD-PEL WO/W CM
3 of 13 series · 9 of 46 positions shown, 15 images · IV contrast (omnipaque)
Comparison: Multiple exams, including CT abdomen from 03/02/2020
and CT chest from 12/20/2019

CLINICAL DATA: Restaging of metastatic left renal cancer. Current
therapy of cabometyx 60 mg daily started in October 2015.

EXAM:
CT CHEST WITH CONTRAST
CT ABDOMEN AND PELVIS WITH AND WITHOUT CONTRAST
TECHNIQUE: Multidetector CT imaging of the chest was performed during
intravenous contrast administration. Multidetector CT imaging of the
abdomen and pelvis was performed following the standard protocol
before and during bolus administration of intravenous contrast.
CONTRAST:  100mL OMNIPAQUE IOHEXOL 300 MG/ML  SOLN

[Series 4: coronal pre · coronal · non-contrast · 0.52mm/px · 2 of 81 slices shown, 3 images]
[im 27/81  soft-tissue]
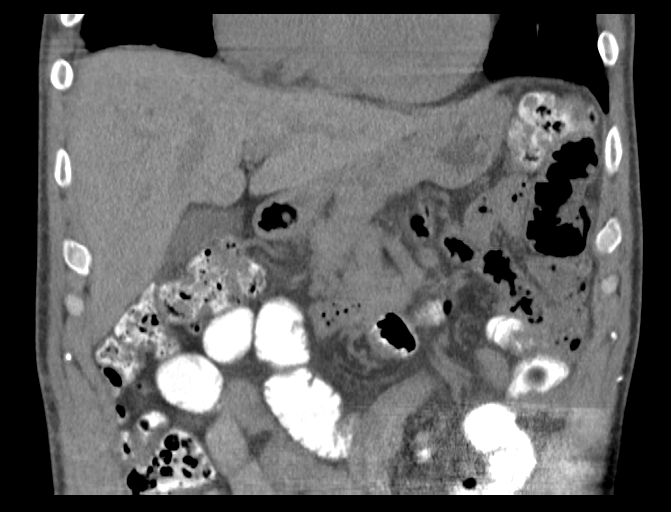
[im 27/81  bone]
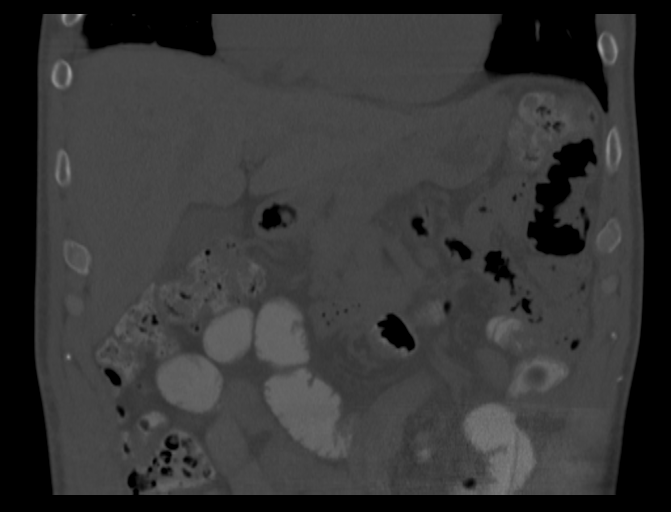
[im 54/81  soft-tissue]
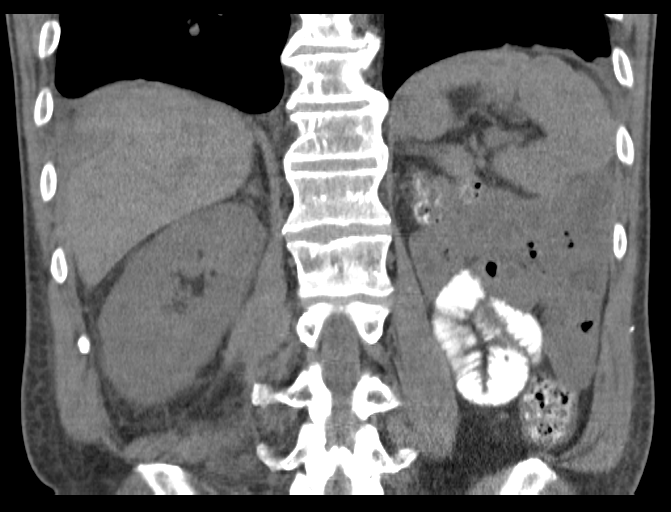

[Series 8: axial arterial · axial · arterial · 0.76mm/px · z∈[-445,-133]mm · 5 of 156 slices shown, 10 images]
[im 26/156  soft-tissue]
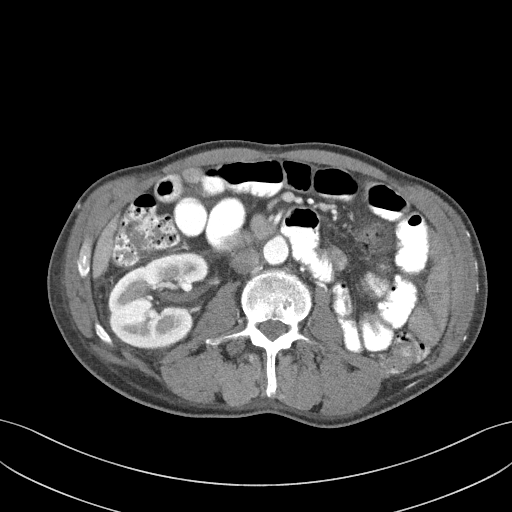
[im 26/156  bone]
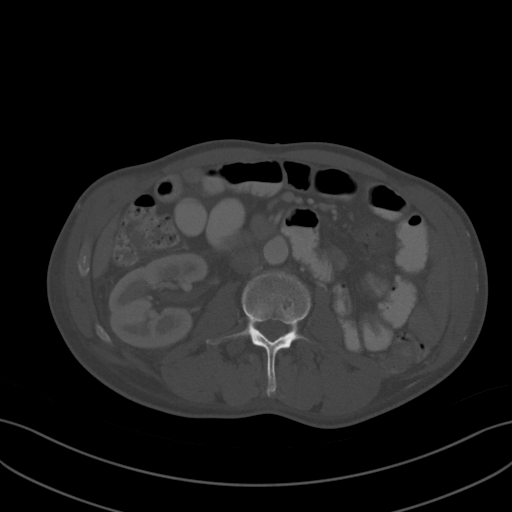
[im 52/156  soft-tissue]
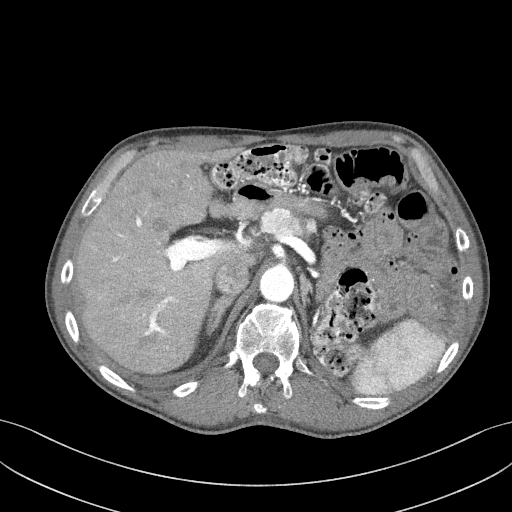
[im 52/156  lung]
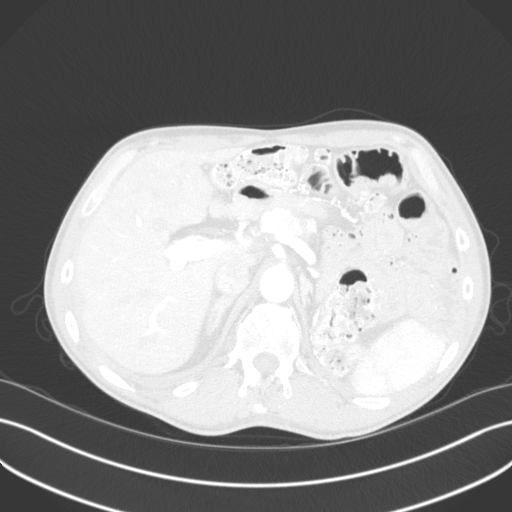
[im 78/156  soft-tissue]
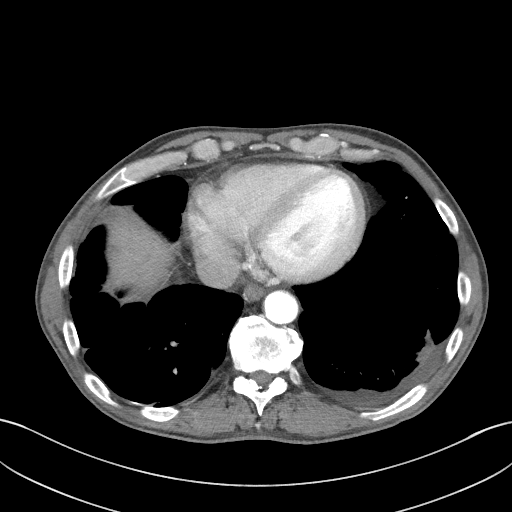
[im 78/156  lung]
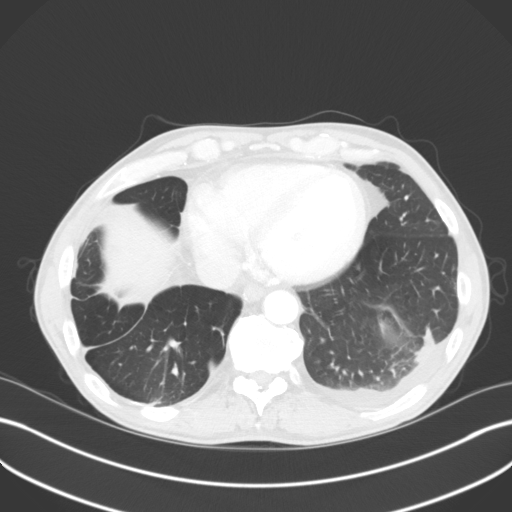
[im 104/156  soft-tissue]
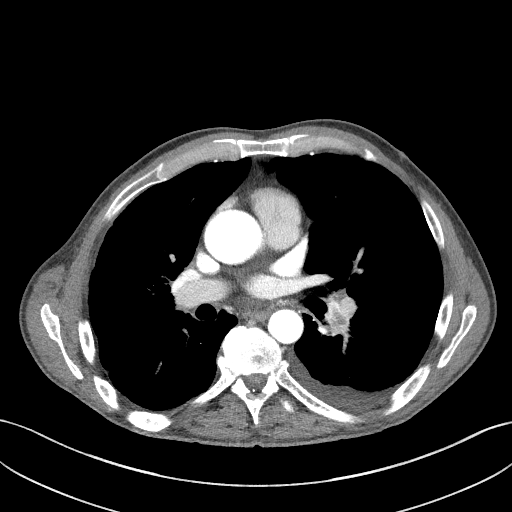
[im 104/156  lung]
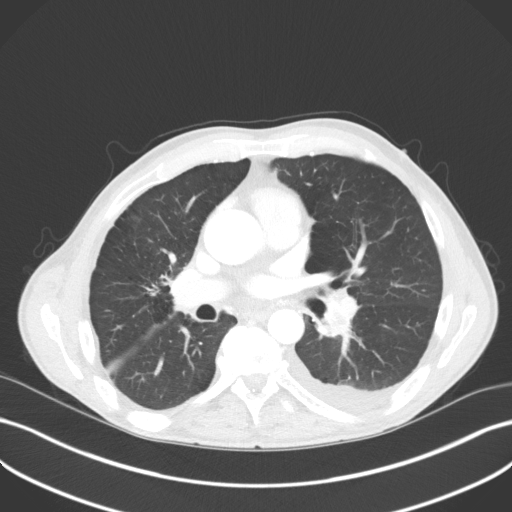
[im 130/156  soft-tissue]
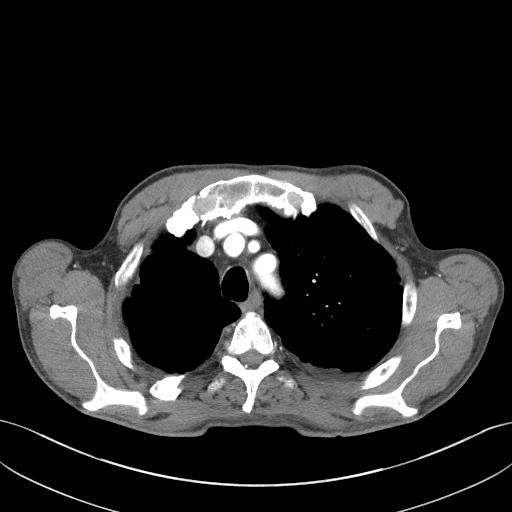
[im 130/156  lung]
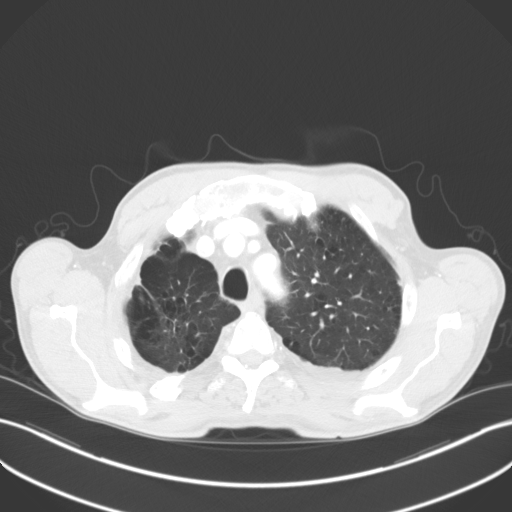

[Series 12: axial nephro · axial · 0.77mm/px · z∈[-659,-578]mm · 2 of 164 slices shown]
[im 28/164  soft-tissue]
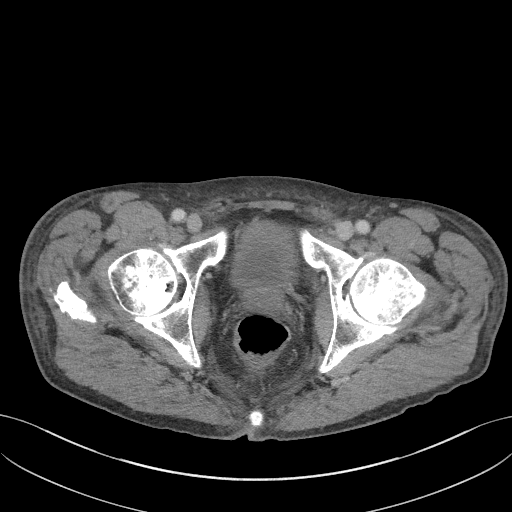
[im 55/164  soft-tissue]
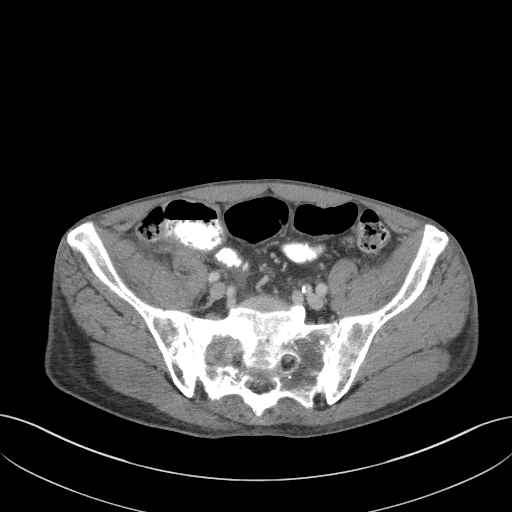

[9 of 46 positions shown; findings below may reference images not displayed]

FINDINGS: CT CHEST FINDINGS

Cardiovascular: Ascending aortic aneurysm 4.0 cm in diameter, image
50/8. Low-density blood pool on precontrast images, suggesting
anemia.

Mediastinum/Nodes: Left hilar node versus perihilar nodule, 3.9 by
3.5 cm on image 49/8, formerly 3.7 by 2.6 cm by my measurements.

Lungs/Pleura: Small left pleural effusion, increased from prior.
Trace right pleural effusion.

Centrilobular emphysema.

Confluent bandlike density in the right upper lobe suprahilar region
measuring up to 1.5 cm in thickness on image 50/7, stable.

Right perihilar nodule spanning the right upper lobe and right
middle lobe measures 2.7 by 2.3 cm on image 93/7, previously 2.7 by
2.2 cm, essentially stable. Subjectively this lesion likewise
appears stable.

Peribronchovascular scarring in the right lower lobe with adjacent
wedge resection clips, unchanged.

Stable mild scattered nodularity in the left upper lobe with
multiple nodules in the 2-4 mm range observed. Continued volume loss
in the superior segment left lower lobe. Subsolid nodularity
anteriorly in the left lower lobe 1.2 by 1.0 cm on image 90/7,
stable.

Musculoskeletal: Glenoid spurring bilaterally. Thoracic spondylosis.
Small sclerotic lesion anteriorly in the T2 vertebral body is
stable. Pain pump catheter terminates at about the T4 vertebral
level along the posterior spinal canal.

CT ABDOMEN AND PELVIS FINDINGS

Hepatobiliary: 1.5 by 1.3 cm focus of arterial phase enhancement
likely in segment 8 of the liver on image 83/8, not well appreciated
on the 07/20/2019 exam or on the 03/02/2020 exam.

In segment 2 of the liver, and indistinct 0.8 cm focus of arterial
phase enhancement is observed, previously about 0.6 cm on
12/20/2019.

In the posterior subcapsular position along the right hepatic lobe,
a 0.9 by 0.5 cm focus of arterial phase enhancement on image 102/8
was only faintly visible on 12/20/2019, measuring about 0.2 by
cm. These arterial phase enhancing foci are not entirely specific
and have reduced conspicuity on the portal venous phase images, but
small metastatic lesions are not readily excluded.

Potential mild gallbladder wall thickening which could be from
hypoproteinemia or inflammation.

Pancreas: Stable dilated dorsal pancreatic duct in the pancreatic
tail, leading into a cluster of enhancing metastatic masses in the
pancreatic body. One such mass measures 2.0 cm in AP diameter on
image 42/12, previously 1.8 cm. A second more nodular mass extending
caudad measures 3.0 cm in anterior-posterior dimension on image
48/12, previously 2.8 cm. Other lesser is of nodularity along the
pancreatic body are also noted and similar to prior.

Spleen: Unremarkable

Adrenals/Urinary Tract: Adrenal glands appear normal. Left
nephrectomy. No significant abnormality of the right kidney or
urinary bladder. No right renal calculus identified.

Stomach/Bowel: Unremarkable

Vascular/Lymphatic: Aortoiliac atherosclerotic vascular disease. No
pathologic adenopathy observed.

Reproductive: Unremarkable

Other: Small amount of pelvic ascites.  Mild mesenteric edema.

Musculoskeletal: Right hip screw. Stable lucent/lytic lesion along
the anterior margin of the right proximal femur. Stable oval-shaped
lytic lesion in the right inferior pubic ramus measuring 2.2 by
cm. Stable lytic lesions in the right sacrum. Degenerative disc
disease at L3-4 and L4-5. Pain pump noted with catheter entering the
spinal canal at the T11-12 level.
IMPRESSION: 1. Increased size of the left hilar node versus perihilar mass,
currently 3.9 by 3.5 cm.
2. Small foci of arterial phase enhancement in the liver, increased
size/conspicuity compared to prior exams. Although benign vascular
malformations are possible, this appearance of increasing
conspicuity raises concern for hypervascular metastatic lesions.
3. Minimal increase in prominence of the metastatic lesions in the
pancreatic body.
4. Stable lytic/lytic lesions in the right proximal femur, right
sacrum, and right inferior pubic ramus.
5. Small amount of pelvic ascites. Mild mesenteric edema.
6. Ascending aortic aneurysm 4.0 cm in diameter. Low-density blood
pool on precontrast images, suggesting anemia.
7. Small left pleural effusion, increased from prior. Trace right
pleural effusion.
8. Gallbladder wall thickening could be from
hypoproteinemia/hypoalbuminemia or inflammation.
9. Emphysema and aortic atherosclerosis.

Aortic Atherosclerosis (AVE8C-YIU.U) and Emphysema (AVE8C-MJT.L).

## 2022-04-23 IMAGING — CT CT CHEST W/ CM
3 of 13 series · 9 of 46 positions shown, 15 images · IV contrast (omnipaque)
Comparison: Multiple exams, including CT abdomen from 03/02/2020
and CT chest from 12/20/2019

CLINICAL DATA: Restaging of metastatic left renal cancer. Current
therapy of cabometyx 60 mg daily started in October 2015.

EXAM:
CT CHEST WITH CONTRAST
CT ABDOMEN AND PELVIS WITH AND WITHOUT CONTRAST
TECHNIQUE: Multidetector CT imaging of the chest was performed during
intravenous contrast administration. Multidetector CT imaging of the
abdomen and pelvis was performed following the standard protocol
before and during bolus administration of intravenous contrast.
CONTRAST:  100mL OMNIPAQUE IOHEXOL 300 MG/ML  SOLN

[Series 4: coronal pre · coronal · non-contrast · 0.52mm/px · 2 of 81 slices shown, 3 images]
[im 27/81  soft-tissue]
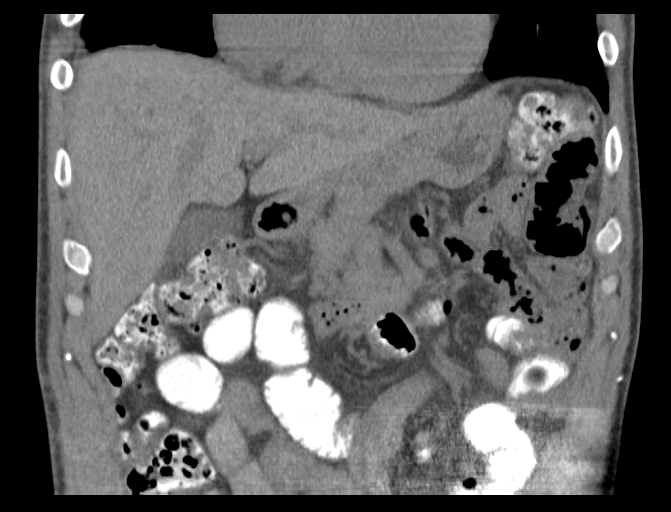
[im 27/81  bone]
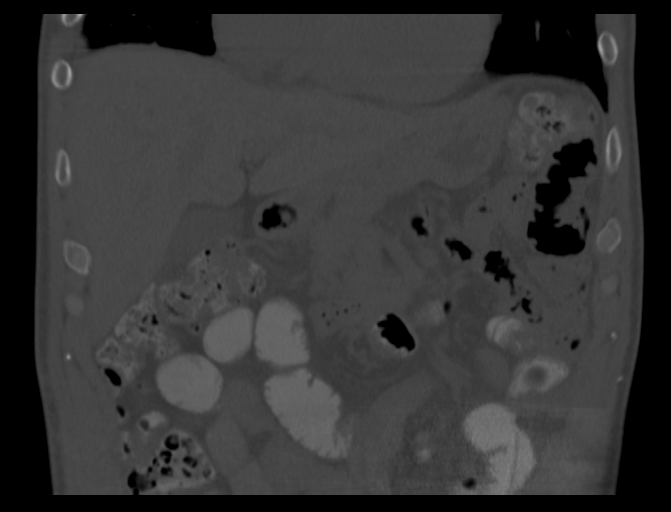
[im 54/81  soft-tissue]
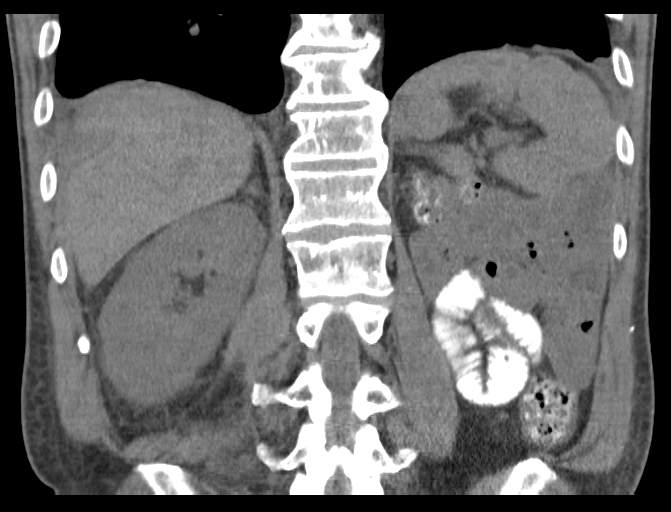

[Series 8: axial arterial · axial · arterial · 0.76mm/px · z∈[-445,-133]mm · 5 of 156 slices shown, 10 images]
[im 26/156  soft-tissue]
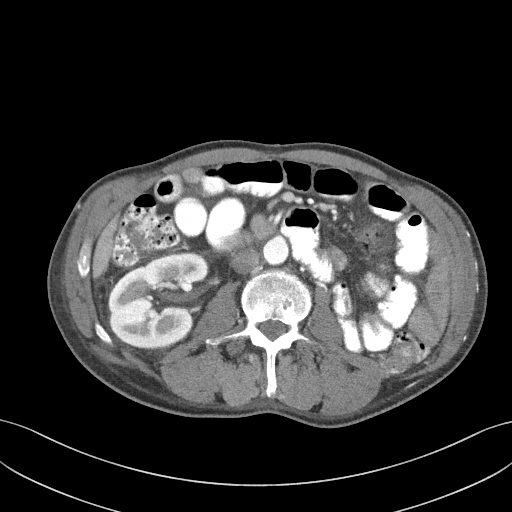
[im 26/156  bone]
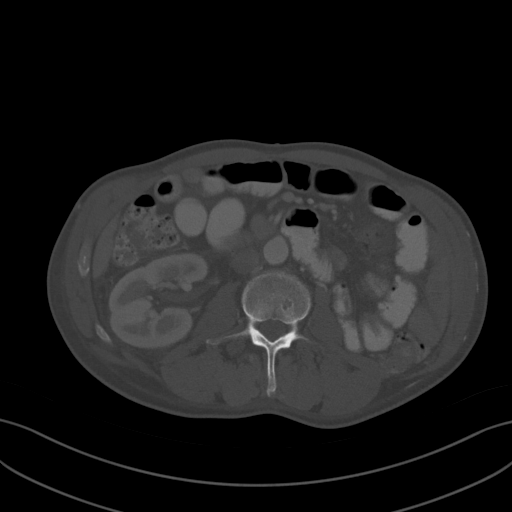
[im 52/156  soft-tissue]
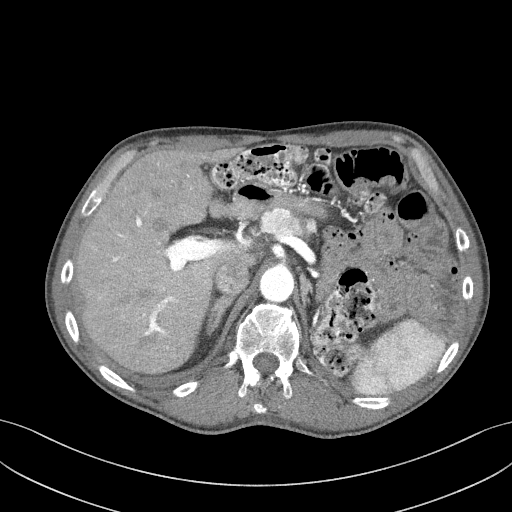
[im 52/156  lung]
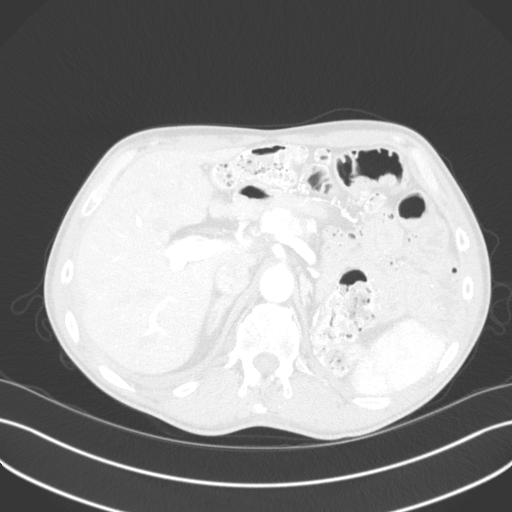
[im 78/156  soft-tissue]
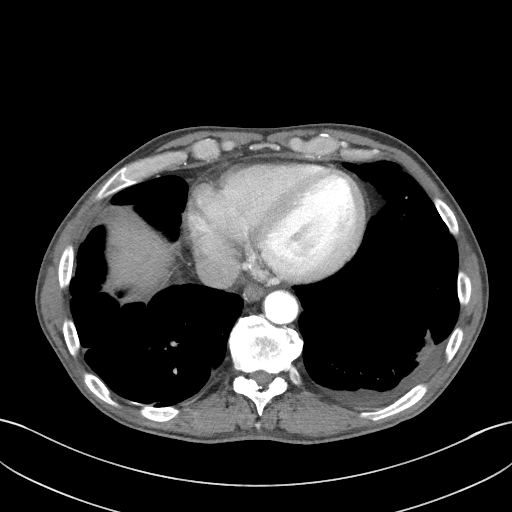
[im 78/156  lung]
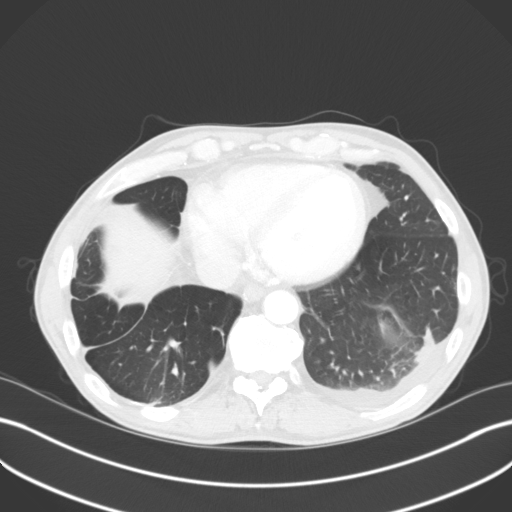
[im 104/156  soft-tissue]
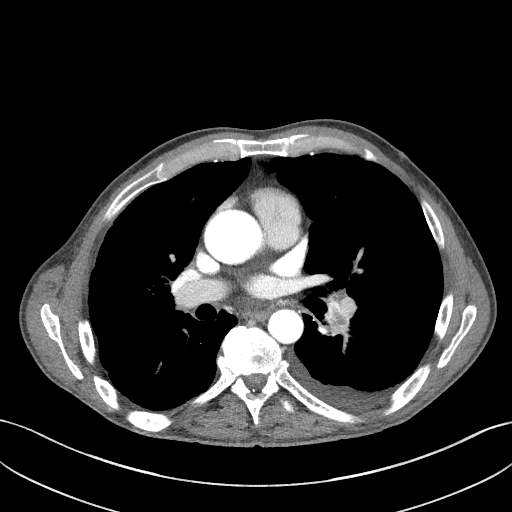
[im 104/156  lung]
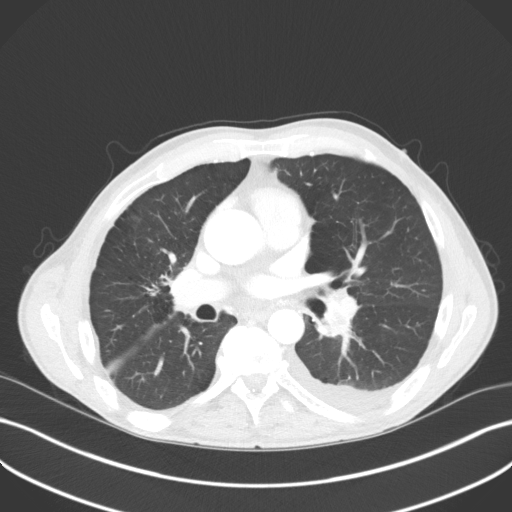
[im 130/156  soft-tissue]
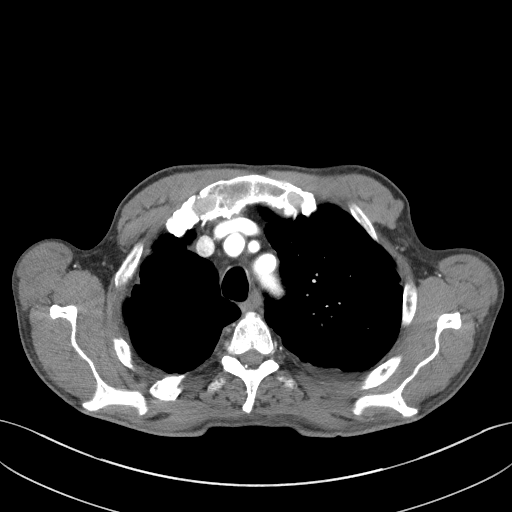
[im 130/156  lung]
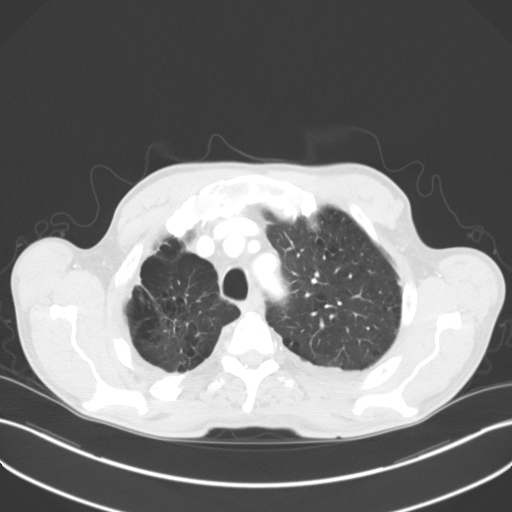

[Series 12: axial nephro · axial · 0.77mm/px · z∈[-659,-578]mm · 2 of 164 slices shown]
[im 28/164  soft-tissue]
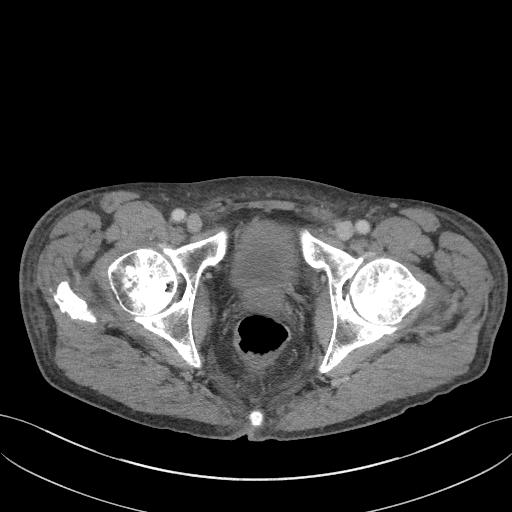
[im 55/164  soft-tissue]
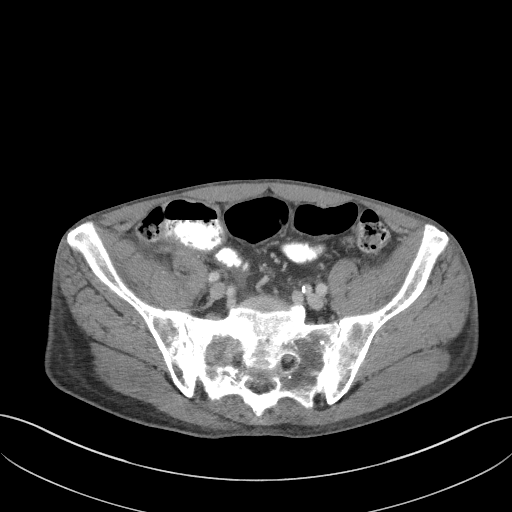

[9 of 46 positions shown; findings below may reference images not displayed]

FINDINGS: CT CHEST FINDINGS

Cardiovascular: Ascending aortic aneurysm 4.0 cm in diameter, image
50/8. Low-density blood pool on precontrast images, suggesting
anemia.

Mediastinum/Nodes: Left hilar node versus perihilar nodule, 3.9 by
3.5 cm on image 49/8, formerly 3.7 by 2.6 cm by my measurements.

Lungs/Pleura: Small left pleural effusion, increased from prior.
Trace right pleural effusion.

Centrilobular emphysema.

Confluent bandlike density in the right upper lobe suprahilar region
measuring up to 1.5 cm in thickness on image 50/7, stable.

Right perihilar nodule spanning the right upper lobe and right
middle lobe measures 2.7 by 2.3 cm on image 93/7, previously 2.7 by
2.2 cm, essentially stable. Subjectively this lesion likewise
appears stable.

Peribronchovascular scarring in the right lower lobe with adjacent
wedge resection clips, unchanged.

Stable mild scattered nodularity in the left upper lobe with
multiple nodules in the 2-4 mm range observed. Continued volume loss
in the superior segment left lower lobe. Subsolid nodularity
anteriorly in the left lower lobe 1.2 by 1.0 cm on image 90/7,
stable.

Musculoskeletal: Glenoid spurring bilaterally. Thoracic spondylosis.
Small sclerotic lesion anteriorly in the T2 vertebral body is
stable. Pain pump catheter terminates at about the T4 vertebral
level along the posterior spinal canal.

CT ABDOMEN AND PELVIS FINDINGS

Hepatobiliary: 1.5 by 1.3 cm focus of arterial phase enhancement
likely in segment 8 of the liver on image 83/8, not well appreciated
on the 07/20/2019 exam or on the 03/02/2020 exam.

In segment 2 of the liver, and indistinct 0.8 cm focus of arterial
phase enhancement is observed, previously about 0.6 cm on
12/20/2019.

In the posterior subcapsular position along the right hepatic lobe,
a 0.9 by 0.5 cm focus of arterial phase enhancement on image 102/8
was only faintly visible on 12/20/2019, measuring about 0.2 by
cm. These arterial phase enhancing foci are not entirely specific
and have reduced conspicuity on the portal venous phase images, but
small metastatic lesions are not readily excluded.

Potential mild gallbladder wall thickening which could be from
hypoproteinemia or inflammation.

Pancreas: Stable dilated dorsal pancreatic duct in the pancreatic
tail, leading into a cluster of enhancing metastatic masses in the
pancreatic body. One such mass measures 2.0 cm in AP diameter on
image 42/12, previously 1.8 cm. A second more nodular mass extending
caudad measures 3.0 cm in anterior-posterior dimension on image
48/12, previously 2.8 cm. Other lesser is of nodularity along the
pancreatic body are also noted and similar to prior.

Spleen: Unremarkable

Adrenals/Urinary Tract: Adrenal glands appear normal. Left
nephrectomy. No significant abnormality of the right kidney or
urinary bladder. No right renal calculus identified.

Stomach/Bowel: Unremarkable

Vascular/Lymphatic: Aortoiliac atherosclerotic vascular disease. No
pathologic adenopathy observed.

Reproductive: Unremarkable

Other: Small amount of pelvic ascites.  Mild mesenteric edema.

Musculoskeletal: Right hip screw. Stable lucent/lytic lesion along
the anterior margin of the right proximal femur. Stable oval-shaped
lytic lesion in the right inferior pubic ramus measuring 2.2 by
cm. Stable lytic lesions in the right sacrum. Degenerative disc
disease at L3-4 and L4-5. Pain pump noted with catheter entering the
spinal canal at the T11-12 level.
IMPRESSION: 1. Increased size of the left hilar node versus perihilar mass,
currently 3.9 by 3.5 cm.
2. Small foci of arterial phase enhancement in the liver, increased
size/conspicuity compared to prior exams. Although benign vascular
malformations are possible, this appearance of increasing
conspicuity raises concern for hypervascular metastatic lesions.
3. Minimal increase in prominence of the metastatic lesions in the
pancreatic body.
4. Stable lytic/lytic lesions in the right proximal femur, right
sacrum, and right inferior pubic ramus.
5. Small amount of pelvic ascites. Mild mesenteric edema.
6. Ascending aortic aneurysm 4.0 cm in diameter. Low-density blood
pool on precontrast images, suggesting anemia.
7. Small left pleural effusion, increased from prior. Trace right
pleural effusion.
8. Gallbladder wall thickening could be from
hypoproteinemia/hypoalbuminemia or inflammation.
9. Emphysema and aortic atherosclerosis.

Aortic Atherosclerosis (AVE8C-YIU.U) and Emphysema (AVE8C-MJT.L).

## 2022-05-04 IMAGING — CR DG CHEST 2V
2 series · 2 of 2 positions shown · non-contrast
Comparison: CT scan June 19, 2020 and chest x-ray August 28, 2019

CLINICAL DATA: Coughing up blood.

EXAM:
CHEST - 2 VIEW

[w chest pa]
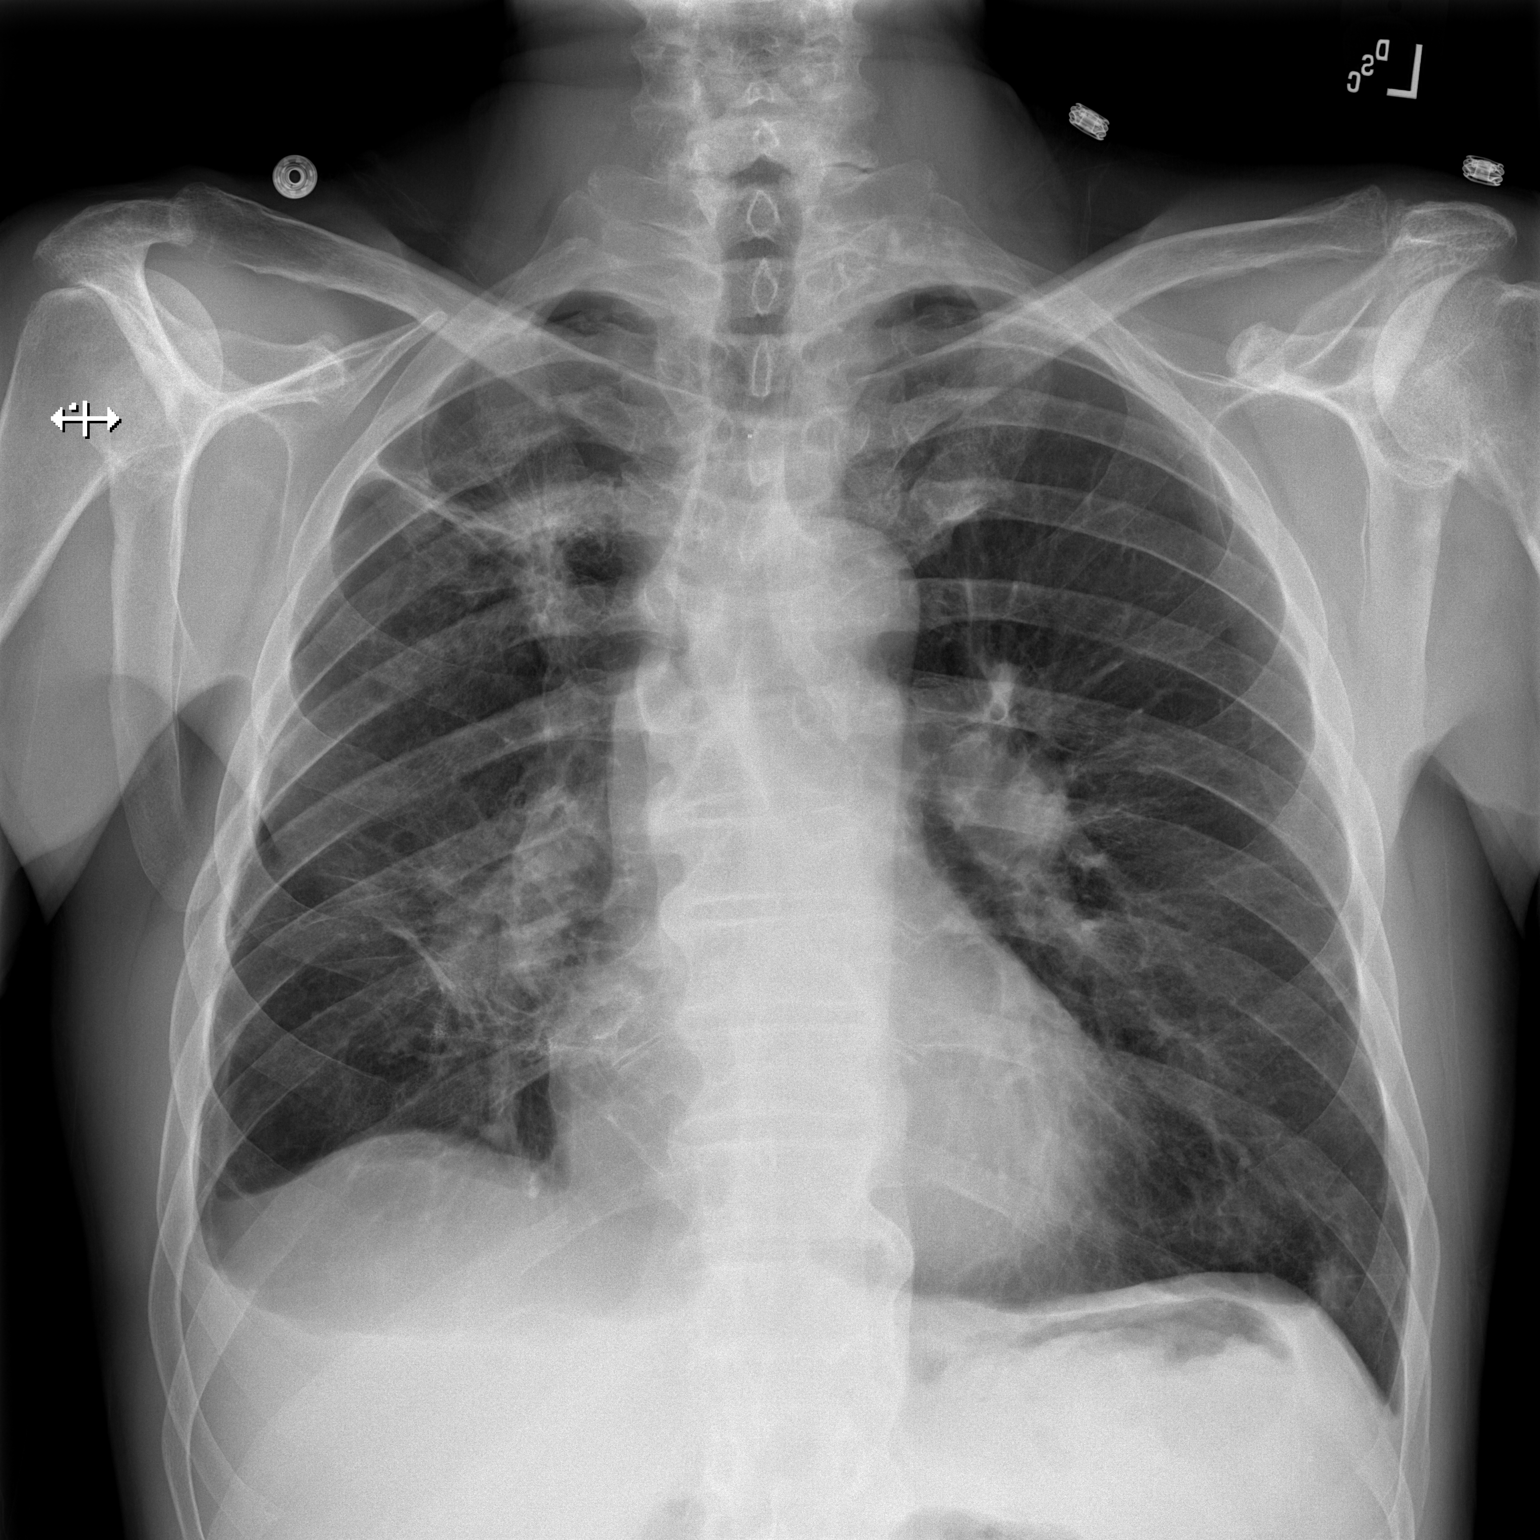

[w chest lat]
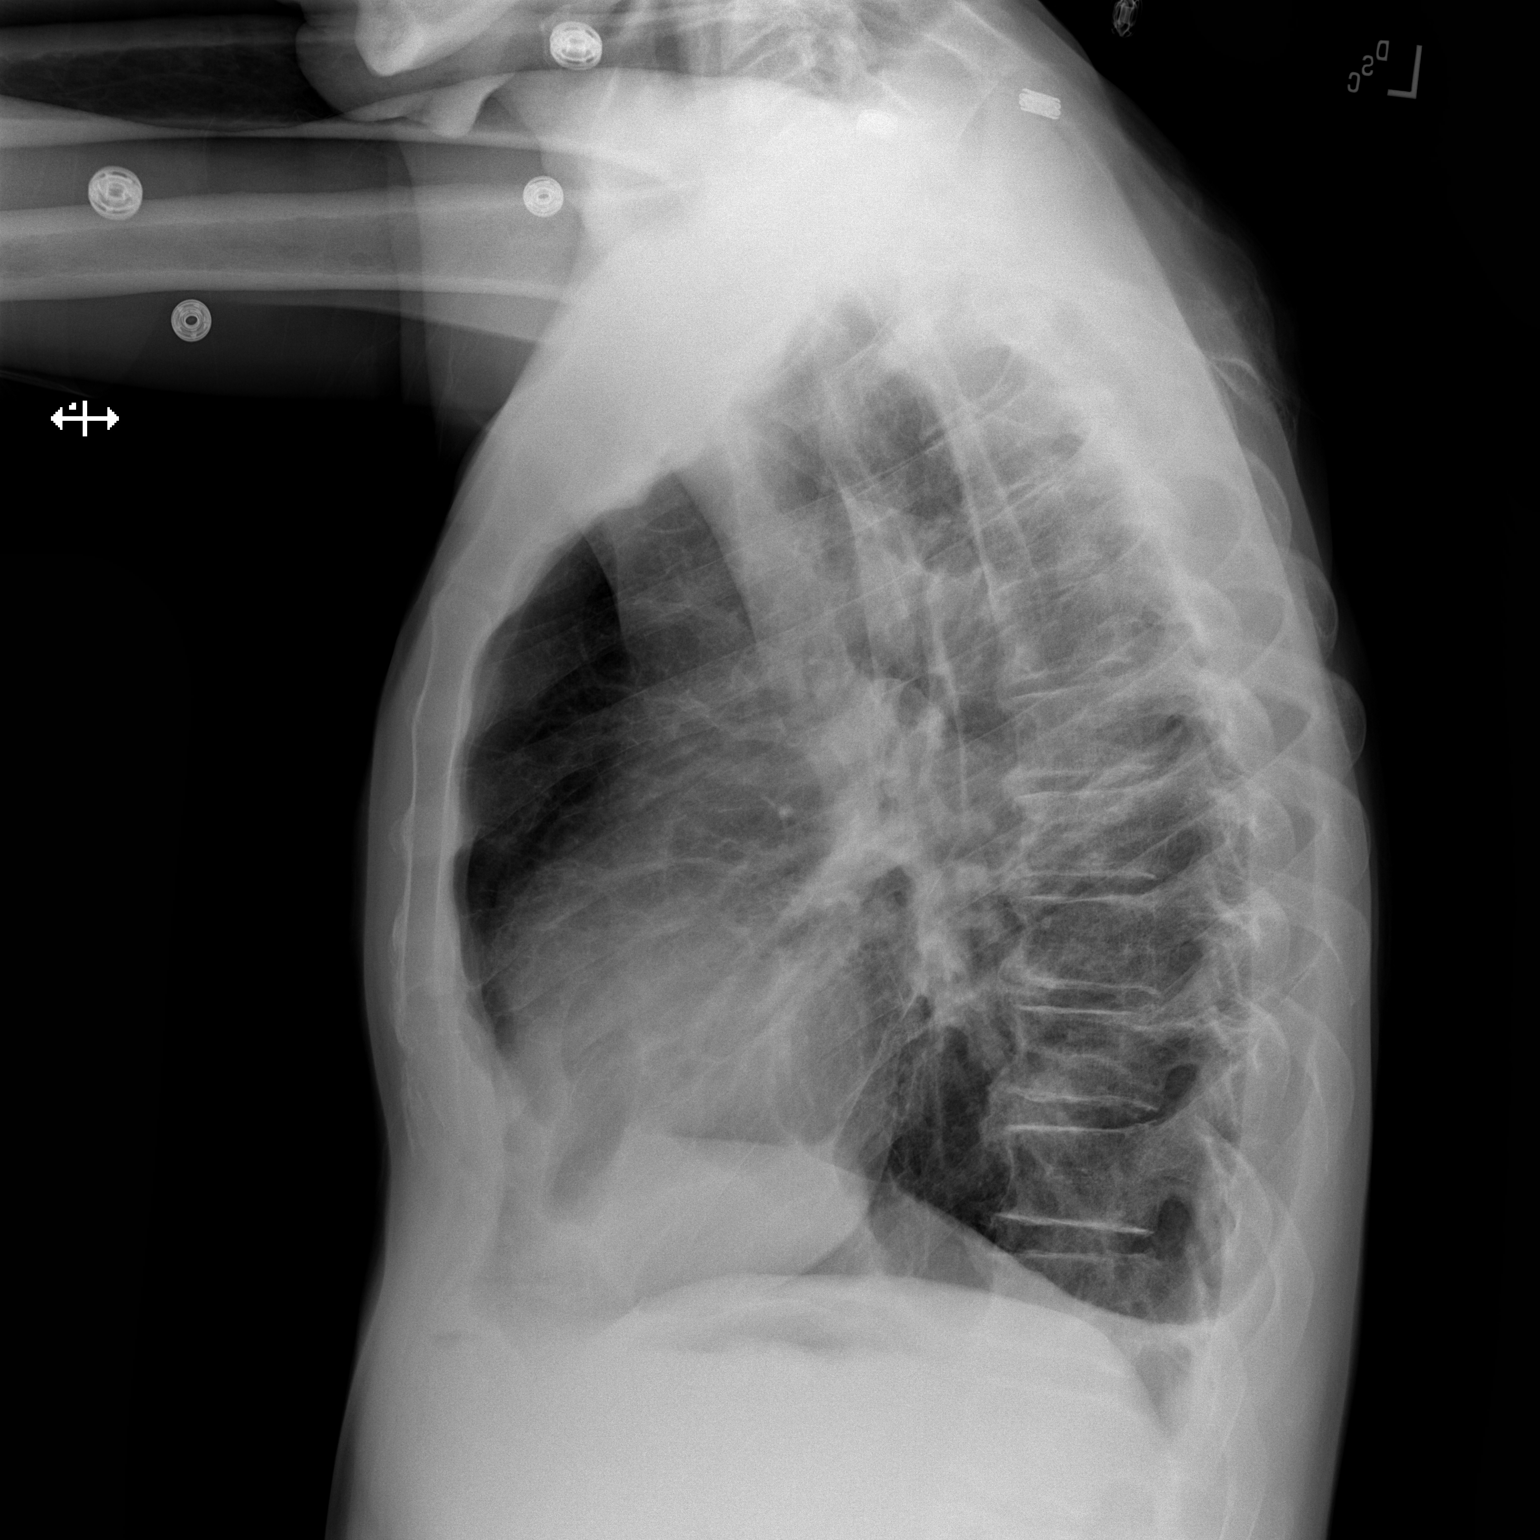

[2 of 2 positions shown; findings below may reference images not displayed]

FINDINGS: Prominence of the left hilum is similar in the interval. The heart
mediastinum are stable. The nodular density over the lateral left
lung base is unchanged since August 2019. Prominence of the right
hilum is similar in the interval. Bandlike opacity in the medial
right upper lobe is also similar. No pneumothorax. Findings of
emphysema/COPD. No other interval changes.
IMPRESSION: Chronic changes in the lungs.  No acute abnormalities are seen.

## 2022-06-27 ENCOUNTER — Ambulatory Visit: Payer: Medicare Other | Admitting: Cardiovascular Disease

## 2022-09-24 IMAGING — CT CT ABD-PEL WO/W CM
2 of 13 series · 10 of 46 positions shown, 15 images · IV contrast (omnipaque)
Comparison: Multiple priors, most recently CT the chest, abdomen
and pelvis 06/19/2020.

CLINICAL DATA: 54-year-old male with history of left-sided renal
cancer status post nephrectomy. Ongoing oral chemotherapy. Blood in
stool for 1 year. Follow-up study.

EXAM:
CT CHEST, ABDOMEN, AND PELVIS WITH CONTRAST
TECHNIQUE: Multidetector CT imaging of the chest, abdomen and pelvis was
performed following the standard protocol during bolus
administration of intravenous contrast.
CONTRAST:  100mL OMNIPAQUE IOHEXOL 300 MG/ML  SOLN

[Series 3: coronal pre · coronal · non-contrast · 0.56mm/px · 2 of 84 slices shown]
[im 28/84  soft-tissue]
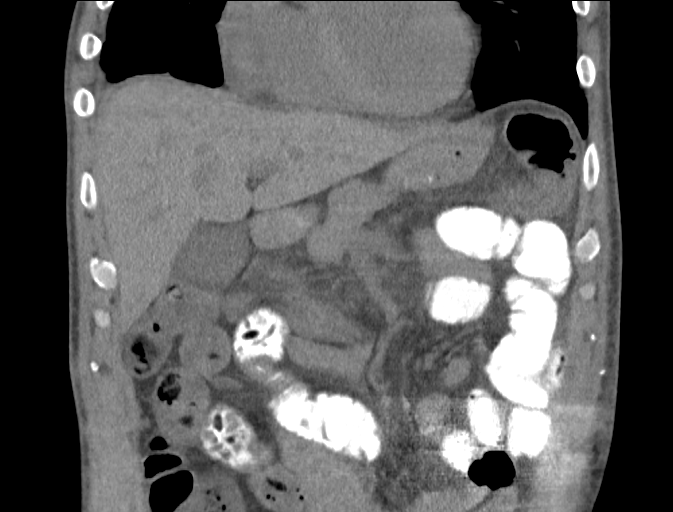
[im 56/84  soft-tissue]
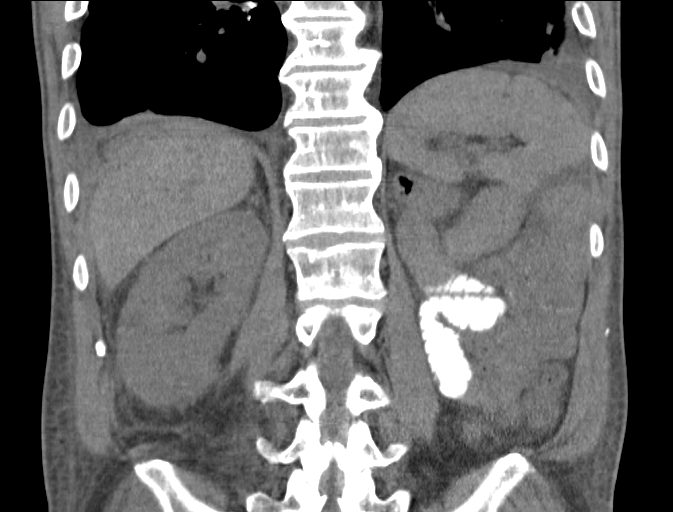

[Series 11: axial nephro · axial · 0.78mm/px · z∈[+1004,+1571]mm · 8 of 243 slices shown, 13 images]
[im 27/243  soft-tissue]
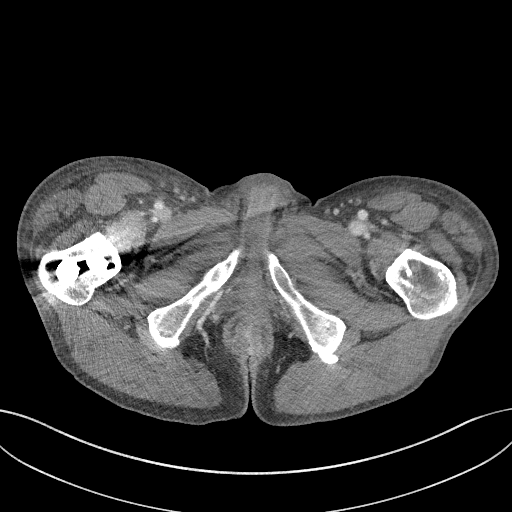
[im 27/243  bone]
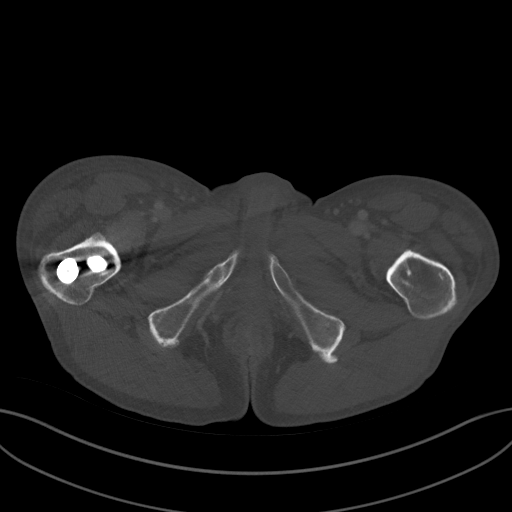
[im 54/243  soft-tissue]
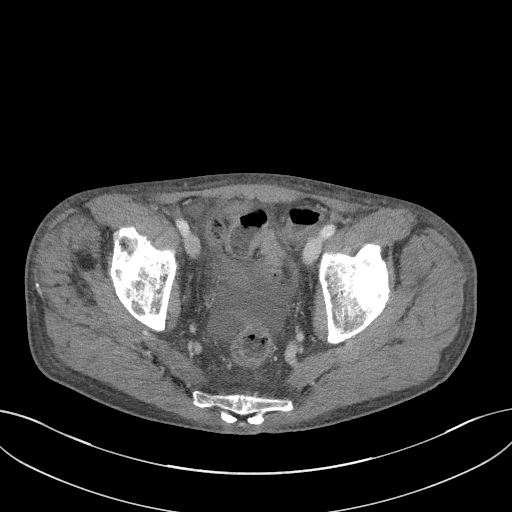
[im 81/243  soft-tissue]
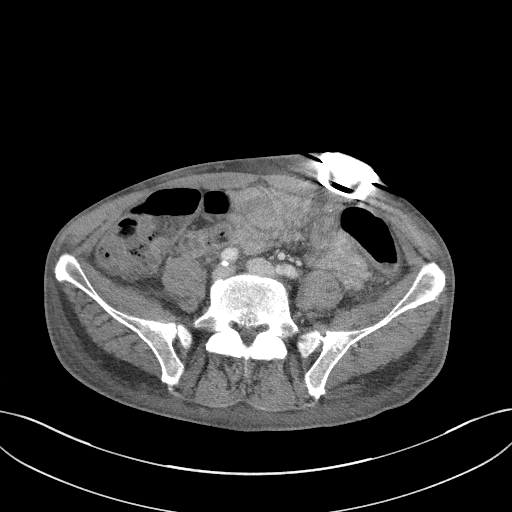
[im 108/243  soft-tissue]
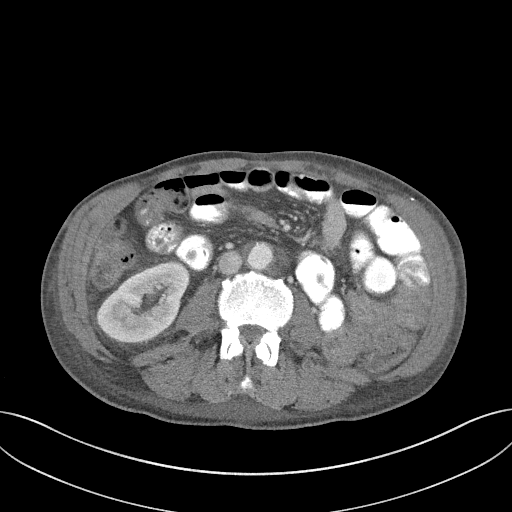
[im 135/243  soft-tissue]
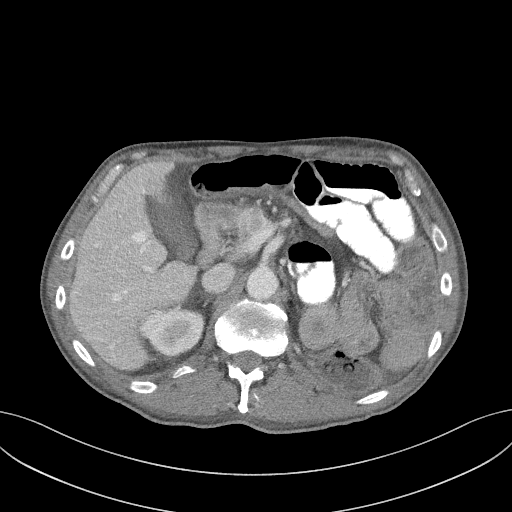
[im 135/243  lung]
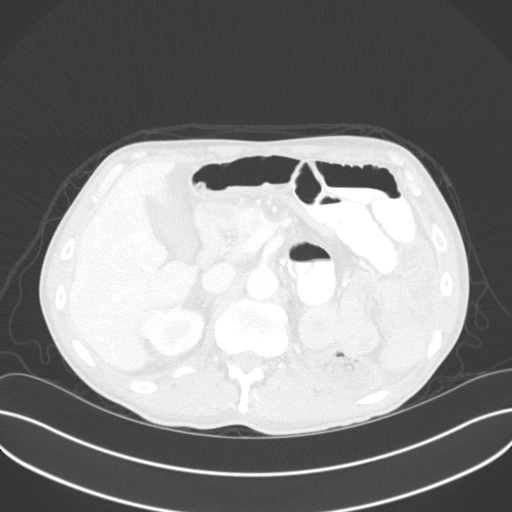
[im 162/243  soft-tissue]
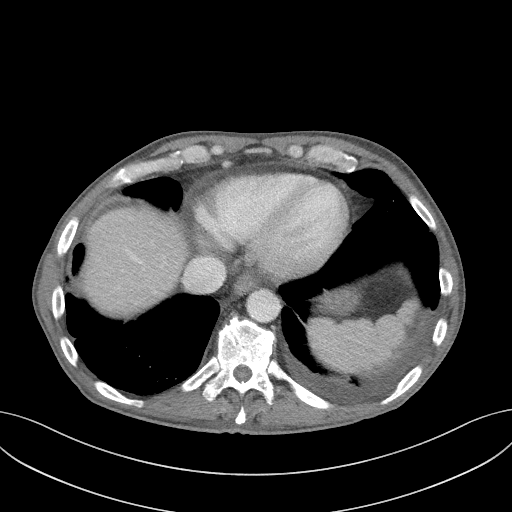
[im 162/243  lung]
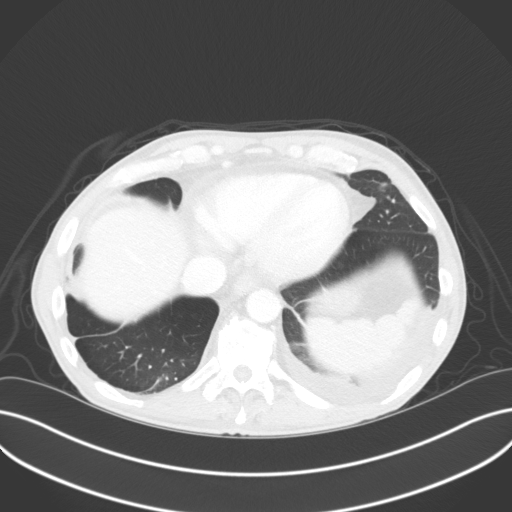
[im 189/243  soft-tissue]
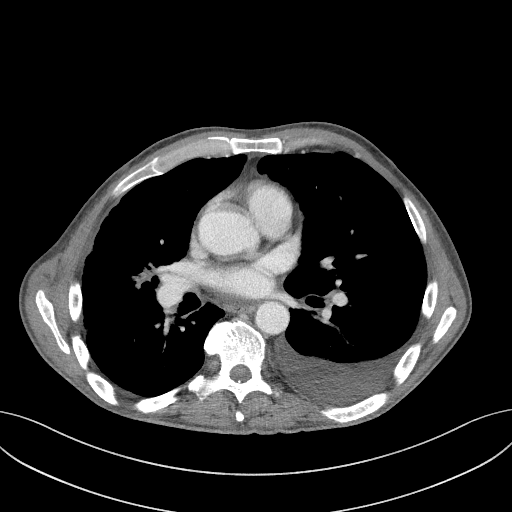
[im 189/243  lung]
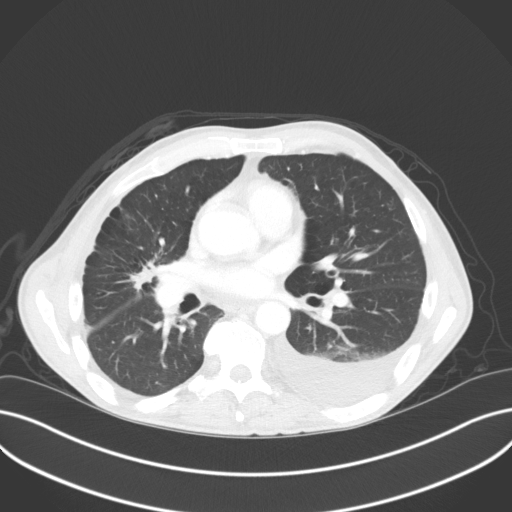
[im 216/243  soft-tissue]
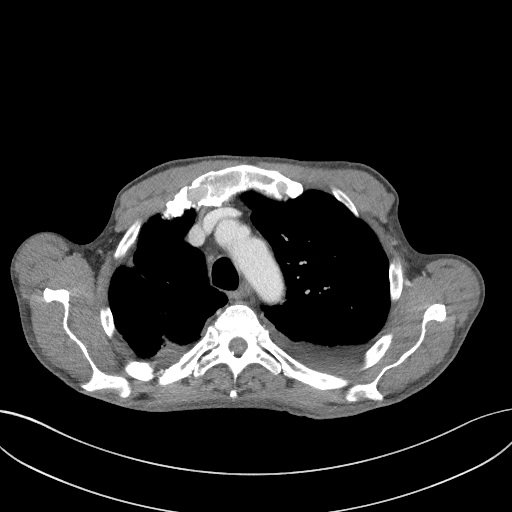
[im 216/243  lung]
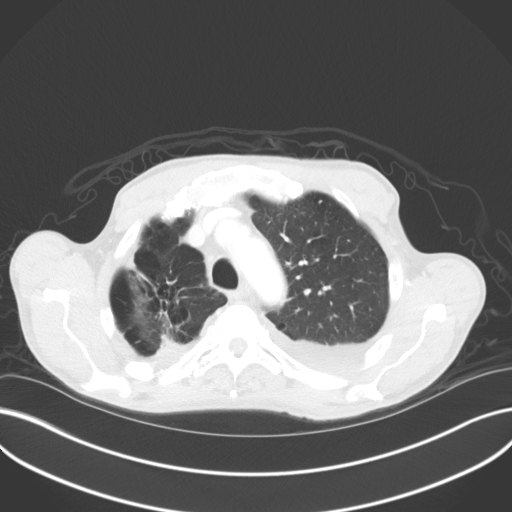

[10 of 46 positions shown; findings below may reference images not displayed]

FINDINGS: CT CHEST FINDINGS

Cardiovascular: Heart size is normal. Trace amount of pericardial
fluid and/or thickening, unlikely to be of any hemodynamic
significance at this time. No associated pericardial calcification.
No atherosclerotic calcifications in the thoracic aorta or the
coronary arteries.

Mediastinum/Nodes: No pathologically enlarged mediastinal or hilar
lymph nodes. Esophagus is unremarkable in appearance. No axillary
lymphadenopathy.

Lungs/Pleura: Central mass in the superior segment of the left lower
lobe (axial image 48 of series 11), similar compared to the prior
study. Macrolobulated nodule in the central aspect of the right
middle lobe (axial image 62 of series 11), also similar to the prior
examination measuring 2.4 x 1.6 cm on today's study. Several other
smaller areas of nodularity, and multifocal areas of architectural
distortion are noted throughout the lungs bilaterally, generally
similar to prior examinations. This includes a band like area of
architectural distortion in the right upper lobe with some
associated bronchiectasis, which is favored to represent area of
postradiation fibrosis. Suture line in the medial aspect of the
right lower lobe from prior wedge resection. No acute consolidative
airspace disease. Moderate left and trace right pleural effusions,
similar to the prior examination. Diffuse bronchial wall thickening
with mild centrilobular and paraseptal emphysema.

Musculoskeletal: 8 mm sclerotic lesion in T2 vertebral body, stable
compared to prior examinations. No other suspicious appearing lytic
or blastic lesions are noted in the visualized portions of the
skeleton.

CT ABDOMEN PELVIS FINDINGS

Hepatobiliary: Previously suspected hypervascular lesions in the
liver are not confidently identified on today's examination (prior
examination included an arterial phase which is not present on
today's study). No new suspicious appearing hepatic lesions are
identified. No intra or extrahepatic biliary ductal dilatation.
Gallbladder is unremarkable in appearance.

Pancreas: There are 2 lesions identified in the pancreas, one in the
pancreatic neck (axial image 109 of series 11 and coronal image 29
of series 12) measuring 2.2 x 2.1 x 1.7 cm, and 1 in the pancreatic
body (axial image 103 of series 11 and coronal image 31 of series
12) measuring 1.7 x 1.6 x 1.9 cm. The lesion in the pancreatic body
is associated with chronic pancreatic ductal obstruction with
dilatation of the proximal pancreatic duct measuring up to 9 mm, as
well as chronic atrophy throughout the distal body and tail of the
pancreas. No peripancreatic fluid collections or inflammatory
changes.

Spleen: Unremarkable.

Adrenals/Urinary Tract: Status post left radical nephrectomy. No
unexpected soft tissue mass noted in the nephrectomy bed to suggest
locally recurrent disease. Right kidney and bilateral adrenal glands
are normal in appearance. No hydroureteronephrosis. Urinary bladder
is unremarkable in appearance.

Stomach/Bowel: The appearance of the stomach is normal. No
pathologic dilatation of small bowel or colon. Mural thickening and
mucosal hyperenhancement noted in the region of the rectum with mild
haziness in the mesorectal fat. No clearly definable rectal mass
confidently identified. There does appear to be some mild
hypervascularity in the mesorectum. Normal appendix.

Vascular/Lymphatic: Aortic atherosclerosis, without evidence of
aneurysm or dissection in the abdominal or pelvic vasculature. No
lymphadenopathy noted in the abdomen or pelvis.

Reproductive: Prostate gland and seminal vesicles are unremarkable
in appearance.

Other: Small volume of ascites predominantly in the low anatomic
pelvis. No pneumoperitoneum.

Musculoskeletal: Infusion pump in the subcutaneous fat of the left
side of the abdomen with catheter extending into the thoracic spine.
Status post ORIF in the right proximal femur traversing a large
lytic lesion in the proximal right femoral diaphysis. Multiple other
areas of lucency are noted throughout the pelvis, several of which
are highly suspicious for metastatic disease, most conspicuously a
3.2 cm lytic lesion in the right-side of the sacral ala (axial image
176 of series 11), similar to the prior examination. There are no
aggressive appearing lytic or blastic lesions noted in the
visualized portions of the skeleton.
IMPRESSION: 1. Metastatic disease in the lungs appears essentially stable
compared to the prior examination allowing for differences in
measurement technique between today's study and the prior
examination. No new metastatic disease identified in the lungs.
2. Previously suspected hypervascular lesions in the liver are not
confidently identified on today's study, presumably benign perfusion
anomalies on the prior study (which included in arterial phase which
was not present on today's examination).
3. Heterogeneously enhancing lesions in the pancreatic neck and
body, similar to the prior examination.
4. Lytic lesions in the right proximal femur and bony pelvis are
similar to prior examination.
5. Moderate left and trace right pleural effusions, similar to the
prior study.
6. Aortic atherosclerosis.
7. Additional incidental findings, similar to prior studies, as
above.

## 2022-12-05 IMAGING — CT CT ABD-PEL WO/W CM
2 of 13 series · 10 of 46 positions shown, 15 images · IV contrast (omnipaque)
Comparison: 11/20/2020

CLINICAL DATA: Restaging metastatic left renal cancer, prior
nephrectomy. Discontinuation of oral chemotherapy. Shortness of
breath.

EXAM:
CT CHEST WITH CONTRAST
CT ABDOMEN AND PELVIS WITH AND WITHOUT CONTRAST
TECHNIQUE: Multidetector CT imaging of the chest was performed during
intravenous contrast administration. Multidetector CT imaging of the
abdomen and pelvis was performed following the standard protocol
before and during bolus administration of intravenous contrast.
CONTRAST:  75mL OMNIPAQUE IOHEXOL 300 MG/ML  SOLN

[Series 4: coronal pre · coronal · non-contrast · 0.54mm/px · 2 of 129 slices shown]
[im 43/129  soft-tissue]
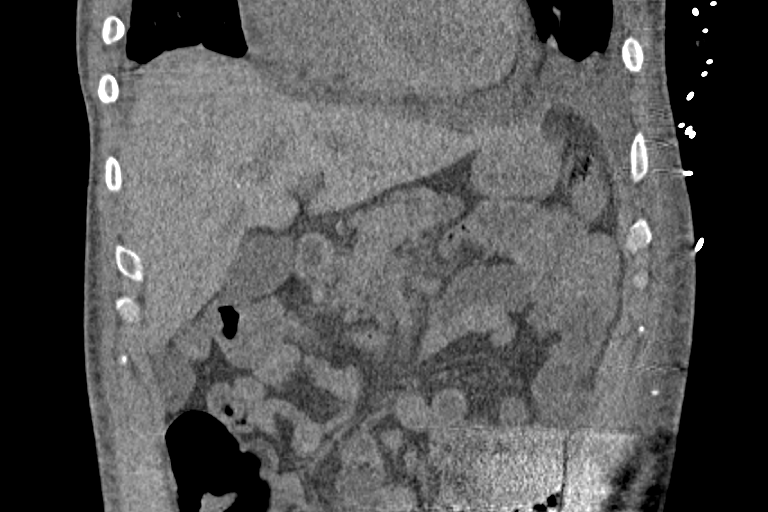
[im 86/129  soft-tissue]
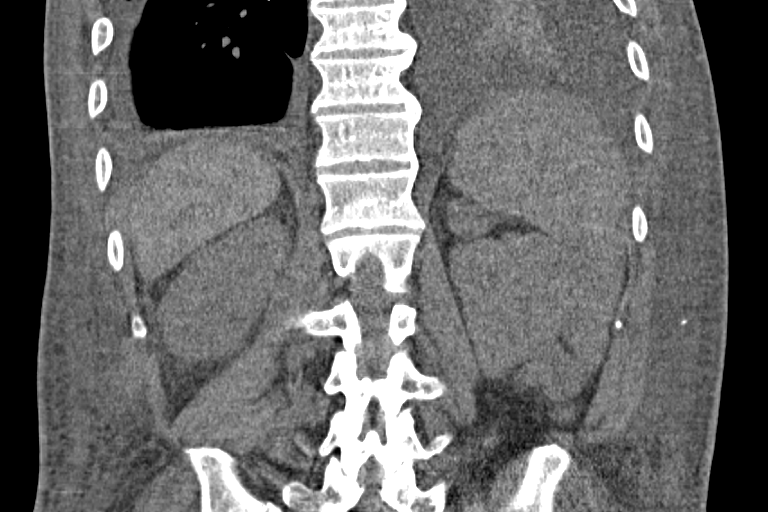

[Series 12: axial nephro · axial · 0.90mm/px · z∈[+1008,+1563]mm · 8 of 239 slices shown, 13 images]
[im 27/239  soft-tissue]
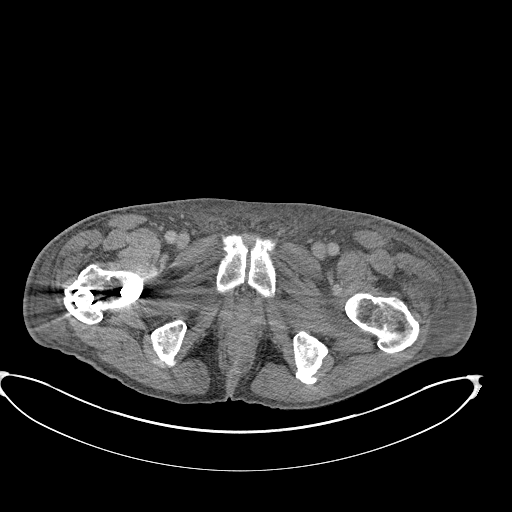
[im 27/239  bone]
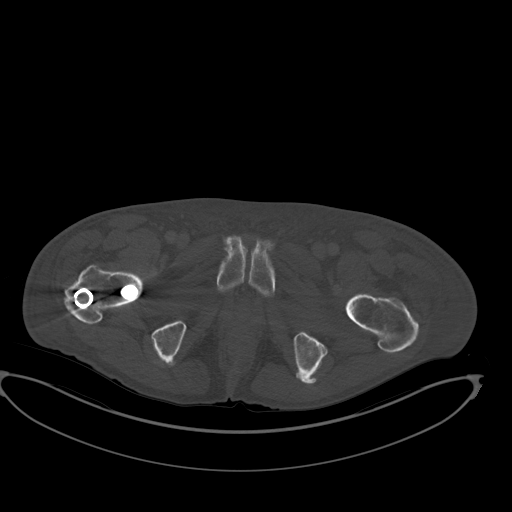
[im 53/239  soft-tissue]
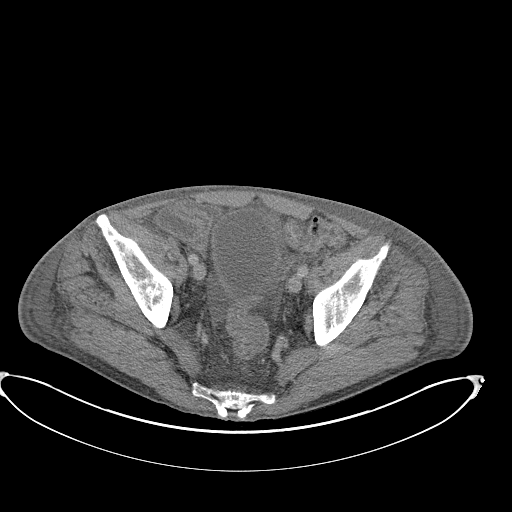
[im 80/239  soft-tissue]
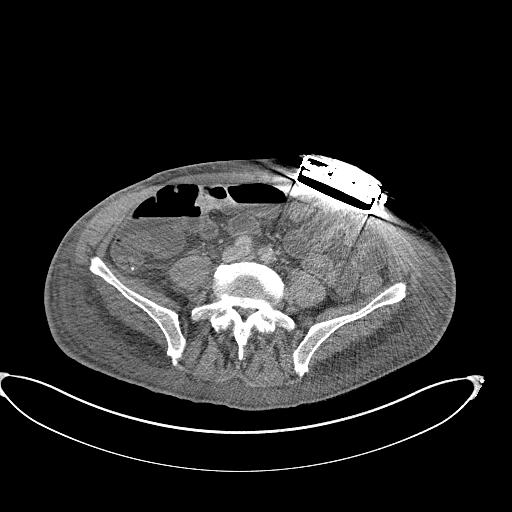
[im 106/239  soft-tissue]
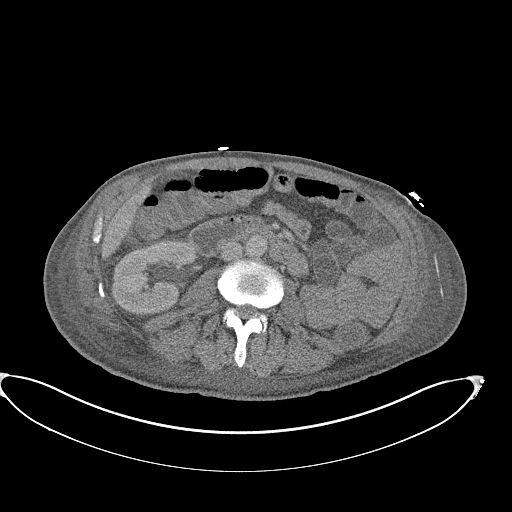
[im 133/239  soft-tissue]
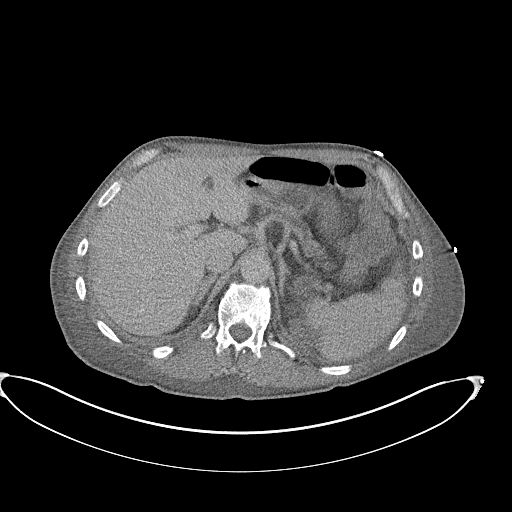
[im 133/239  lung]
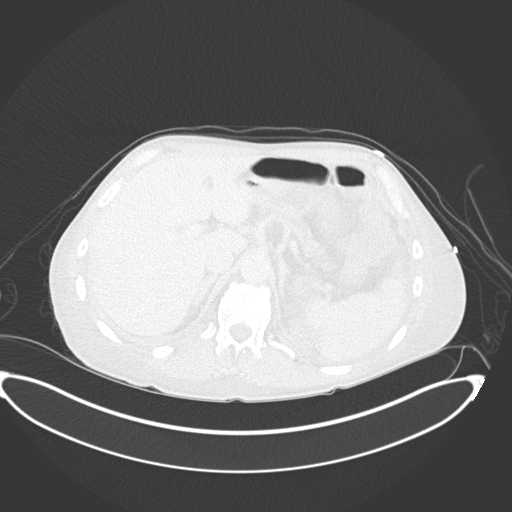
[im 159/239  soft-tissue]
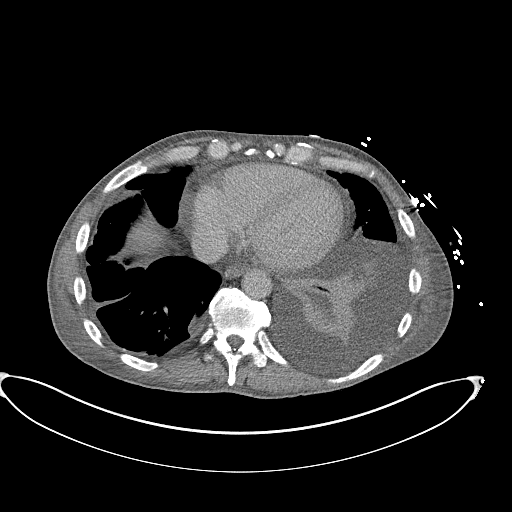
[im 159/239  lung]
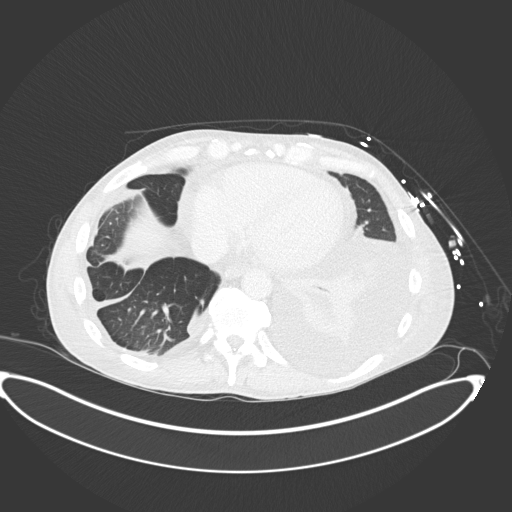
[im 186/239  soft-tissue]
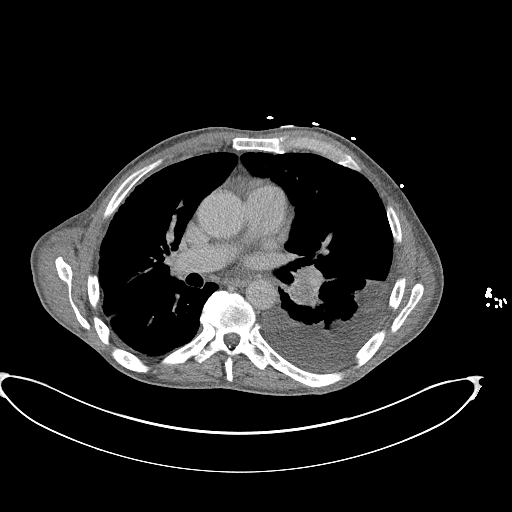
[im 186/239  lung]
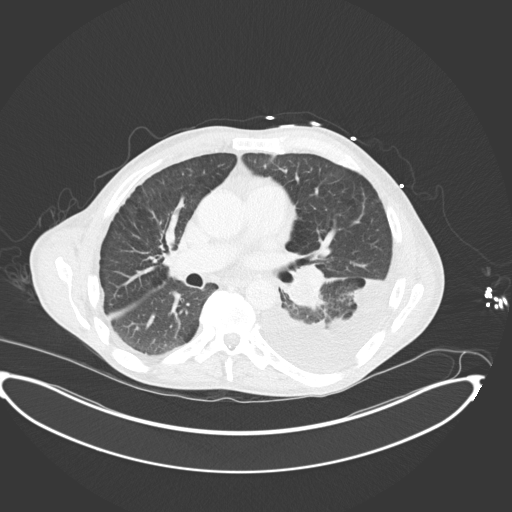
[im 212/239  soft-tissue]
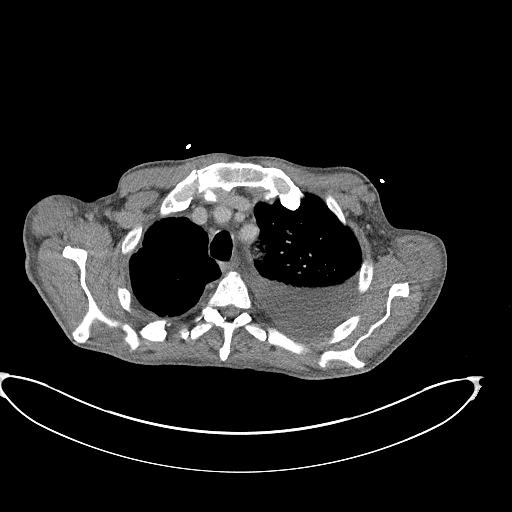
[im 212/239  lung]
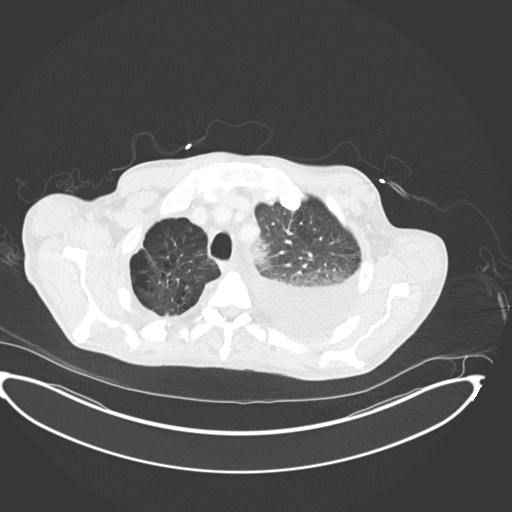

[10 of 46 positions shown; findings below may reference images not displayed]

FINDINGS: CT CHEST FINDINGS

Cardiovascular: No significant thoracic atherosclerosis. Please note
that today's exam was not performed as a CT angiogram and is not
considered highly sensitive for pulmonary embolus. Stable small
pericardial effusion.

Mediastinum/Nodes: 1.3 by 0.9 cm hypodense nodule anteriorly in the
right thyroid lobe on image 6 of series 12. Not clinically
significant; no follow-up imaging recommended (ref: [HOSPITAL]. [DATE]): 143-50).

There is background stranding in the mediastinum which matches the
background stranding in the subcutaneous tissues and likely reflects
third spacing of fluid.

Lungs/Pleura: Predominantly left lower lobe perihilar mass measures
4.3 by 3.4 cm on image 50 of series 12, previously 3.1 by 3.2 cm by
my measurements.

Right middle lobe and right upper lobe peribronchovascular mass
measures 2.4 cm along its short axis, previously 2.1 cm.

Stable thick bandlike scarring with volume loss in the right upper
lobe. Underlying emphysema. Scattered scarring in the lungs. There
is some hazy ground-glass opacity in the lungs which is increased
from prior and could be a subtle manifestation of edema or third
spacing of fluid.

Moderate to large left pleural effusion has increased from prior.
Small right pleural effusion appear stable.

Musculoskeletal: Thoracic spondylosis. Stable small anterior
sclerotic lesion in the T2 vertebral body. Anomalous medial left
second rib. Observe 0 catheter noted terminating at about the T4
level posteriorly.

CT ABDOMEN AND PELVIS FINDINGS

Hepatobiliary: No well delineated hepatic lesions are observed.
Gallbladder unremarkable.

Pancreas: Dorsal pancreatic duct dilatation extending to a
pancreatic body mass with enhancing portion of this mass measuring
3.6 by 2.0 cm on image 41 of series 7, previously 3.6 by 1.9 cm.
Along the anterior margin of the pancreatic head, a 3.1 by 2.3 cm
mass is present and previously measured 2.8 by 2.4 cm. The
pancreatic lesions are roughly stable. Questionable third small
pancreatic nodule along the pancreatic head on image 54 of series 7
measuring 0.9 cm in diameter, stable. Possible fourth pancreatic
nodule along the anterior portion of the pancreatic tail measuring
1.0 cm in diameter, stable.

Spleen: Unremarkable

Adrenals/Urinary Tract: Left nephrectomy. Adrenal glands
unremarkable. The right kidney appears unremarkable. No urinary
tract calculi are observed. Urinary bladder unremarkable.

Stomach/Bowel: No dilated bowel. Similar appearance of mild diffuse
rectal wall thickening compared to previous, possibly incidental.

Vascular/Lymphatic: Aortoiliac atherosclerotic vascular disease. No
pathologic adenopathy.

Reproductive: Unremarkable

Other: Diffuse subcutaneous and mesenteric edema suggesting third
spacing of fluid. Trace free pelvic fluid, improved from prior.
Diffuse stranding in the perirectal space.

Musculoskeletal: Left lower abdominal wall infusion pump. Necks
chloride Suriel lucent lesion of the right sacral ala probably
representing a metastatic lesion and not appreciably changed from
prior. Mild sclerosis along the SI joints. Right proximal femur
ORIF, with a lucency in the right proximal femur shown on image 76
of series 13 and also noted on prior exam. Heterotopic ossification
above the greater trochanter. Sharply defined lytic lesion of the
right inferior pubic ramus measuring 2.4 by 1.3 cm on image 217 of
series 12 with sclerotic margination, previously 2.2 by 1.3 cm.
IMPRESSION: 1. Mild enlargement of the left lower lobe perihilar mass, and of
the right middle lobe/right upper lobe peribronchovascular mass.
Increased left pleural effusion, now moderate to large. Stable small
right pleural effusion. Stable small pericardial effusion.
2. Diffuse subcutaneous, mesenteric, and mediastinal edema
compatible with third spacing of fluid. There is some hazy density
in the lungs which also may be due to noncardiogenic edema.
3. Stable appearance of multiple pancreatic masses.
4. Stable osseous manifestations of metastatic disease, with the
largest lesion in the right sacrum.
5. Emphysema and aortic atherosclerosis.

Aortic Atherosclerosis (E7E6Z-CPO.O) and Emphysema (E7E6Z-1R9.5).

## 2022-12-05 IMAGING — CR DG CHEST 2V
2 series · 2 of 2 positions shown · non-contrast
Comparison: 06/30/2020

CLINICAL DATA: Shortness of breath.  Metastatic renal cell cancer.

EXAM:
CHEST - 2 VIEW

[w chest lat]
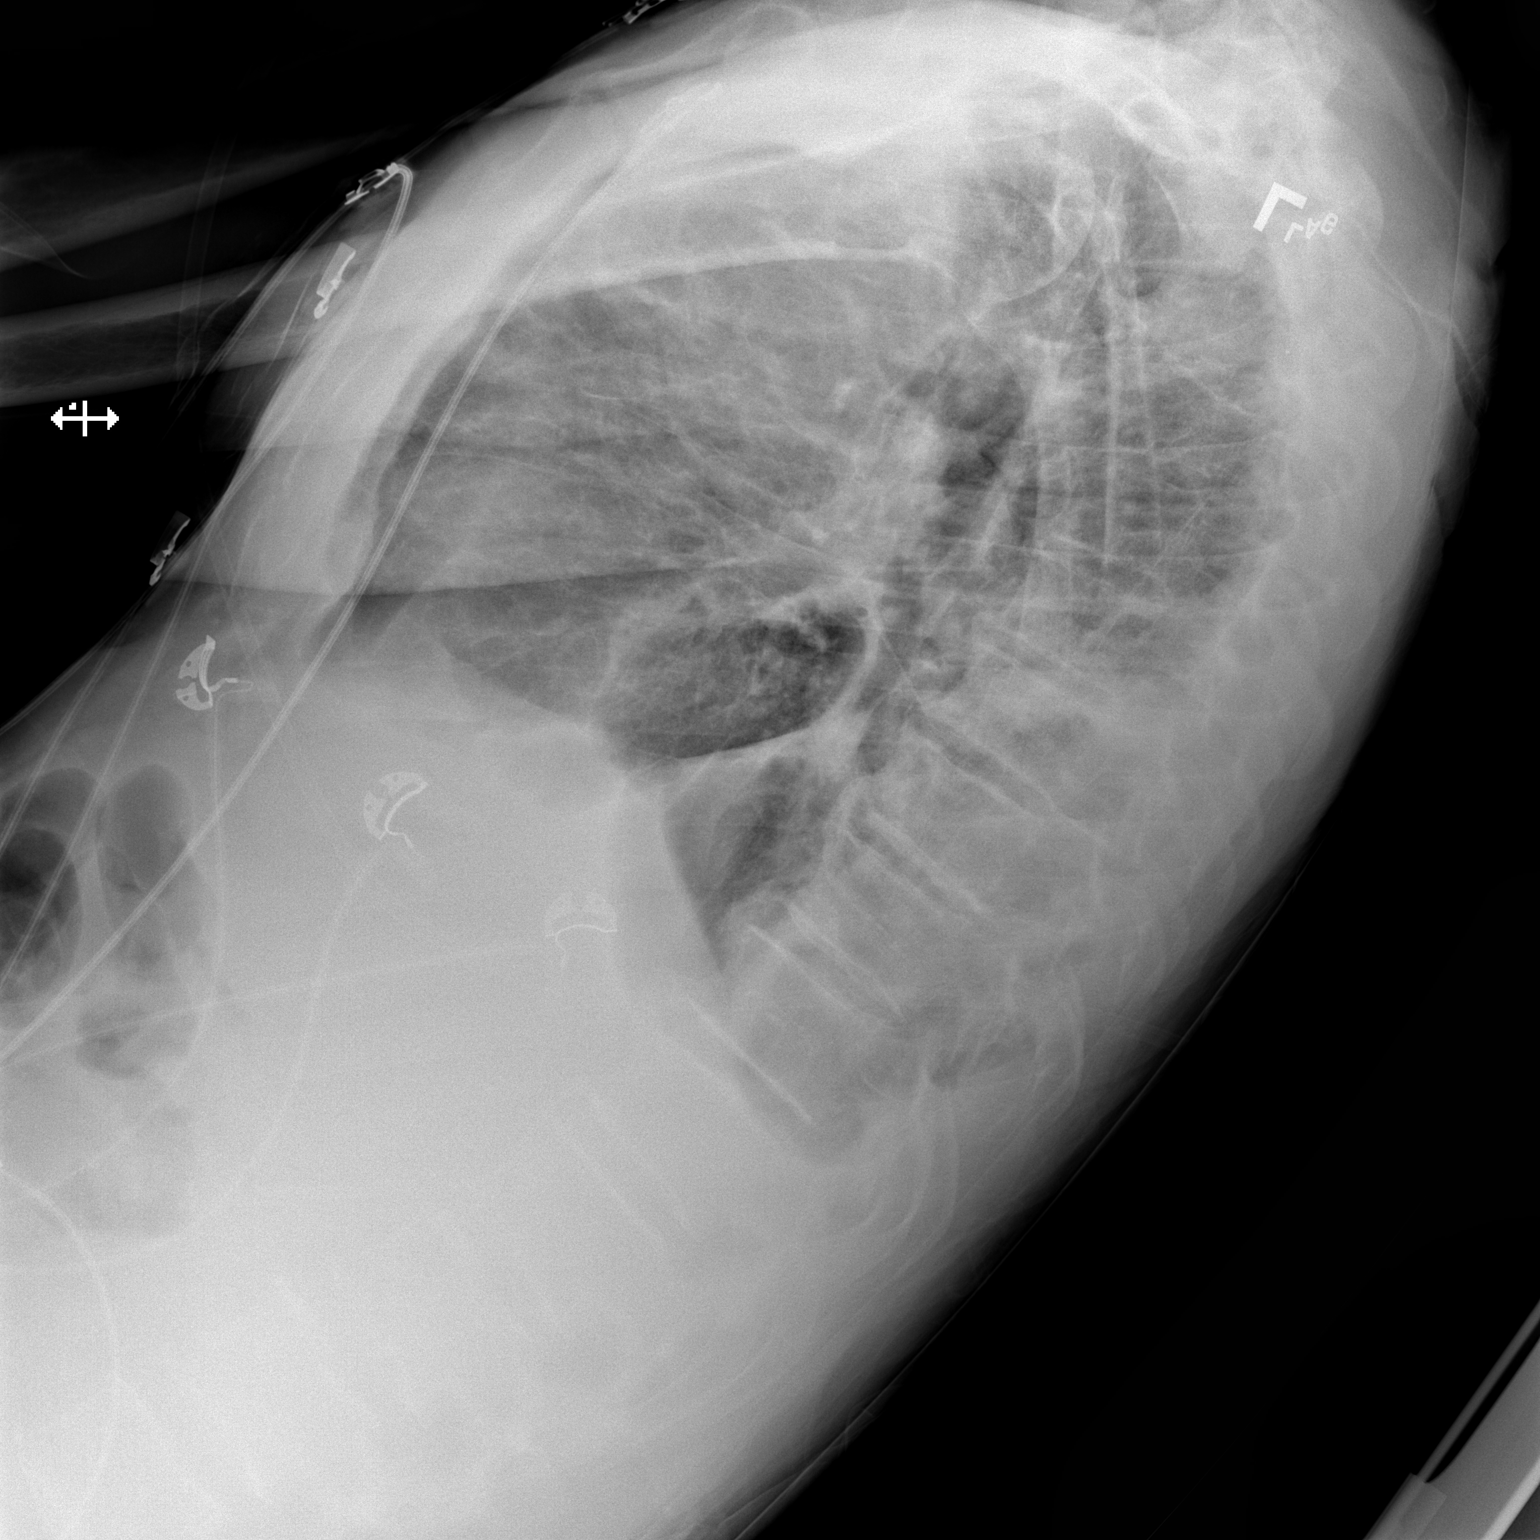

[x chest ap]
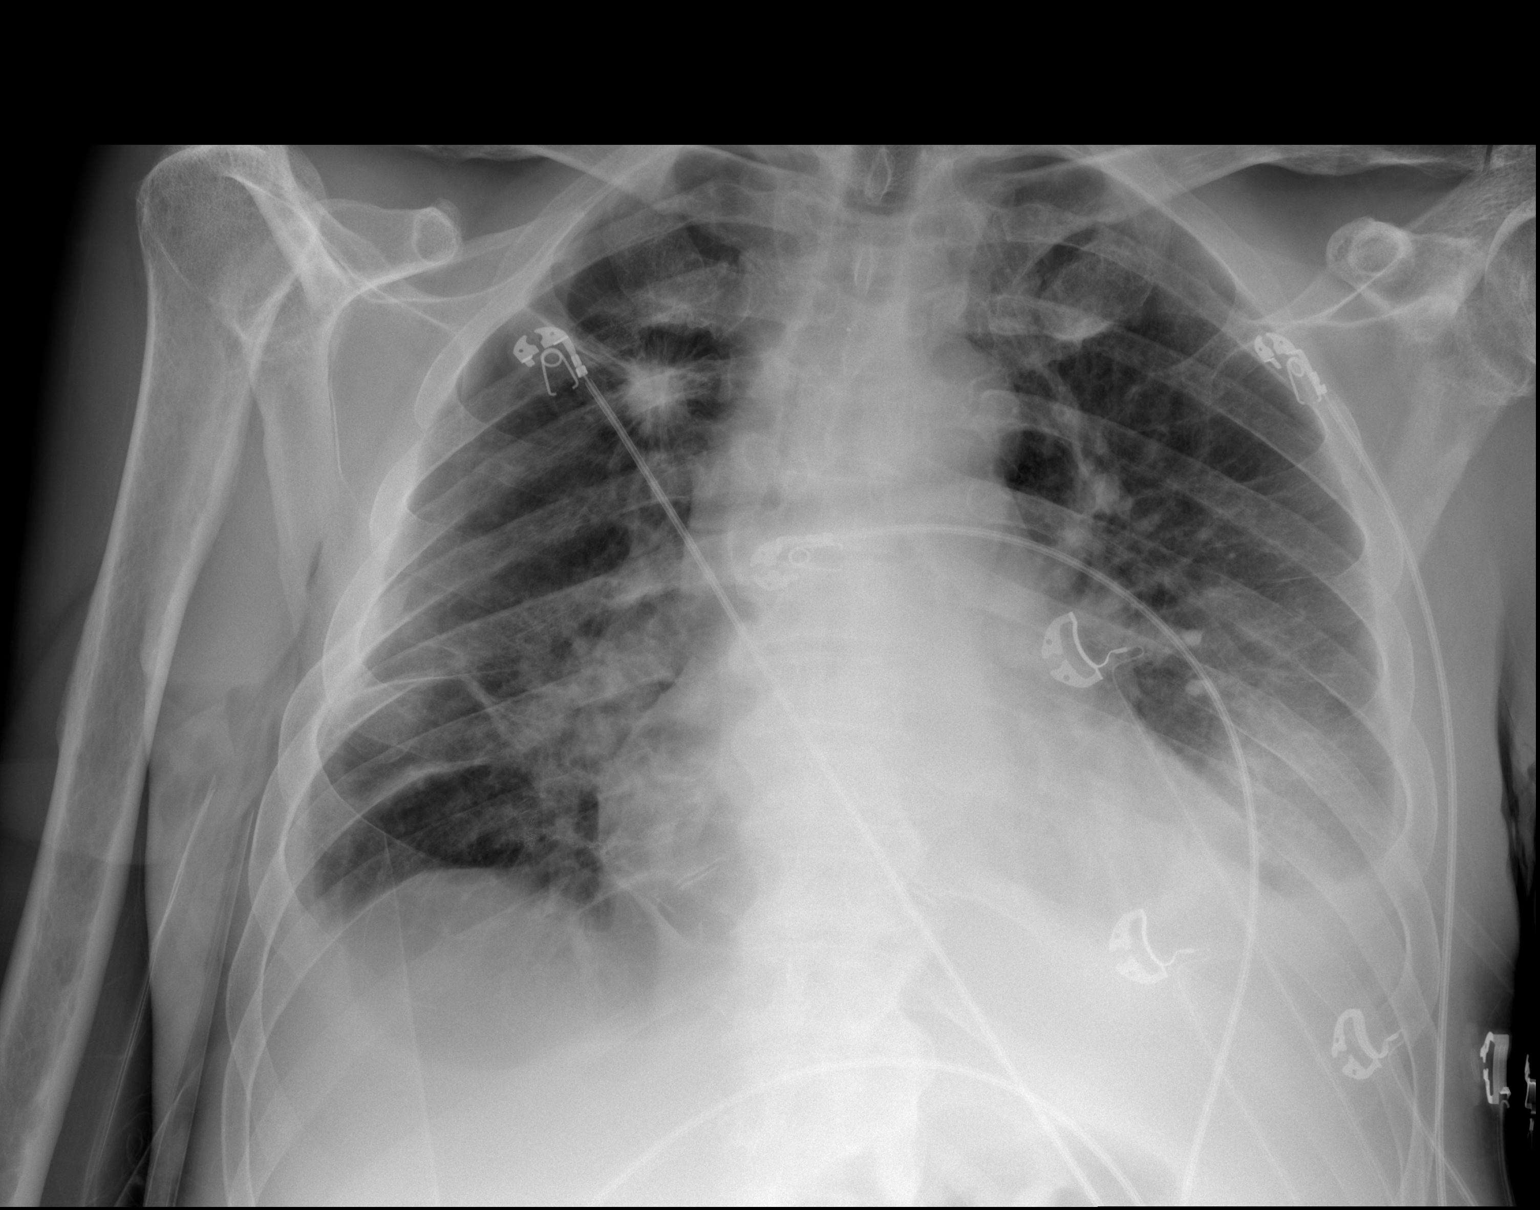

[2 of 2 positions shown; findings below may reference images not displayed]

FINDINGS: Interval progression of spiculated nodular opacity in the suprahilar
right lung with progressive right infrahilar opacity in this patient
with known metastatic renal cell carcinoma. New left base
collapse/consolidation with small left pleural effusion. Tiny right
pleural effusion is similar to prior. The cardio pericardial
silhouette is enlarged. The visualized bony structures of the thorax
show no acute abnormality.
IMPRESSION: 1. Interval progression of right suprahilar and right infrahilar
pulmonary metastatic disease.
2. New left base collapse/consolidation with small left pleural
effusion. Findings may be related to metastatic involvement or
pneumonia.

## 2022-12-05 IMAGING — CT CT CHEST W/ CM
2 of 7 series · 10 of 42 positions shown, 15 images · IV contrast (omnipaque)
Comparison: 11/20/2020

CLINICAL DATA: Restaging metastatic left renal cancer, prior
nephrectomy. Discontinuation of oral chemotherapy. Shortness of
breath.

EXAM:
CT CHEST WITH CONTRAST
CT ABDOMEN AND PELVIS WITH AND WITHOUT CONTRAST
TECHNIQUE: Multidetector CT imaging of the chest was performed during
intravenous contrast administration. Multidetector CT imaging of the
abdomen and pelvis was performed following the standard protocol
before and during bolus administration of intravenous contrast.
CONTRAST:  75mL OMNIPAQUE IOHEXOL 300 MG/ML  SOLN

[Series 3: axial pre · axial · non-contrast · 0.90mm/px · z∈[+1193,+1400]mm · 7 of 93 slices shown, 12 images]
[im 12/93  soft-tissue]
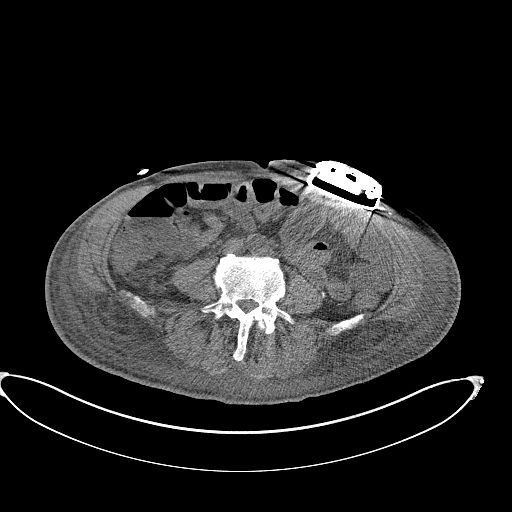
[im 12/93  bone]
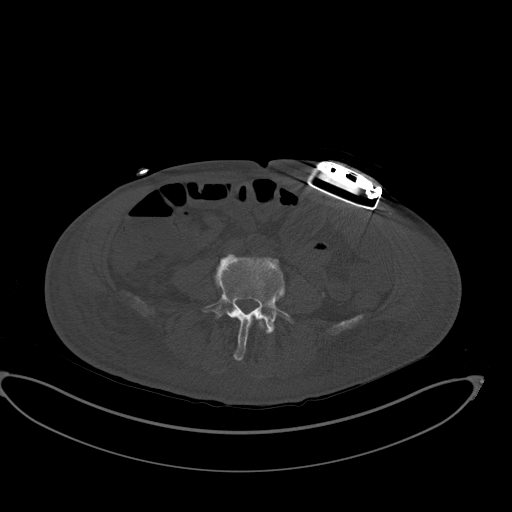
[im 24/93  soft-tissue]
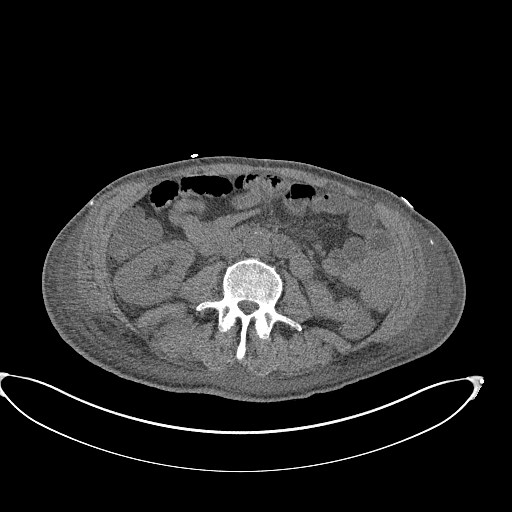
[im 35/93  soft-tissue]
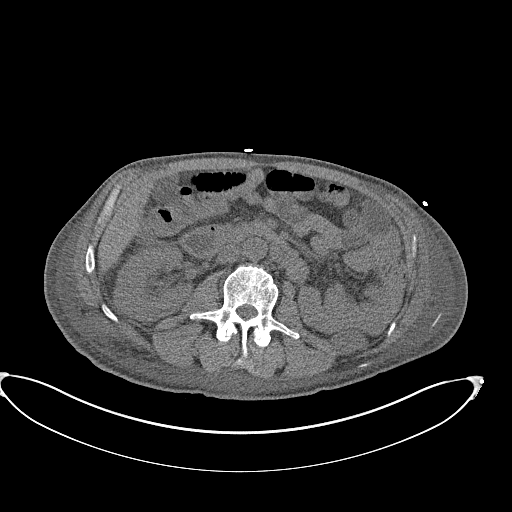
[im 47/93  soft-tissue]
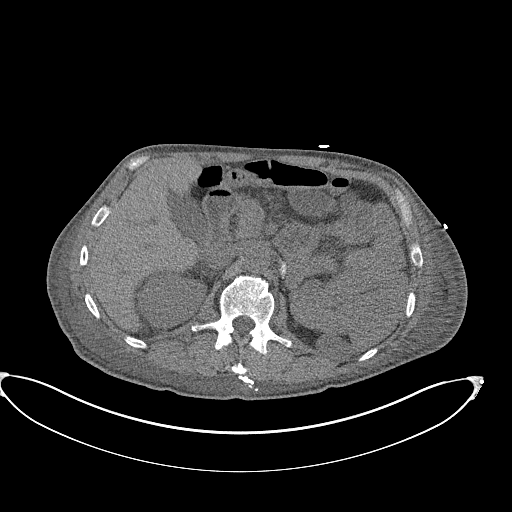
[im 47/93  lung]
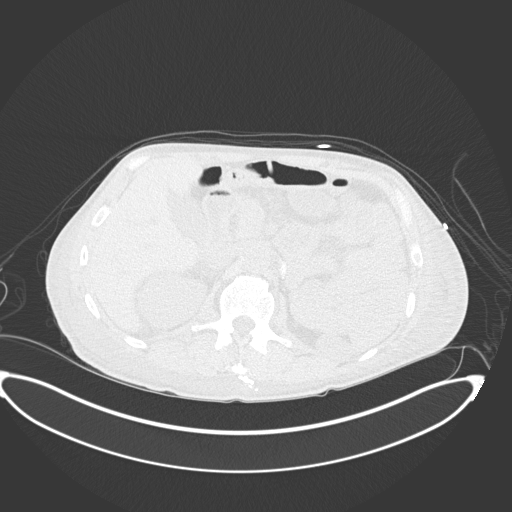
[im 58/93  soft-tissue]
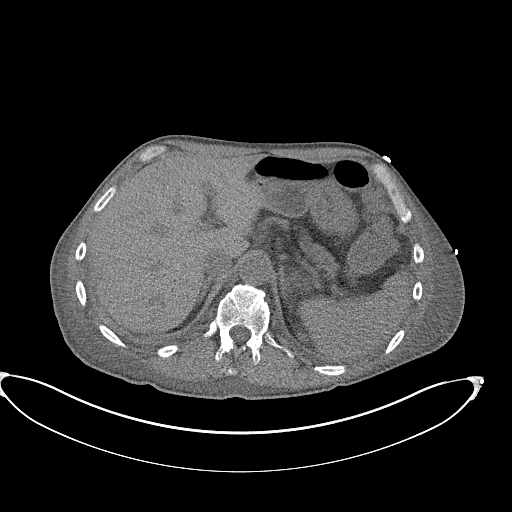
[im 58/93  lung]
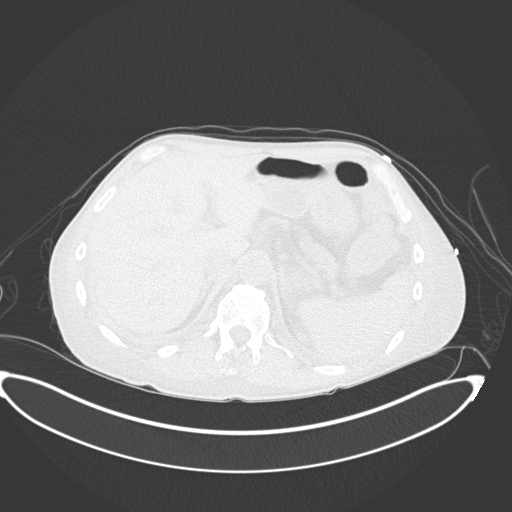
[im 70/93  soft-tissue]
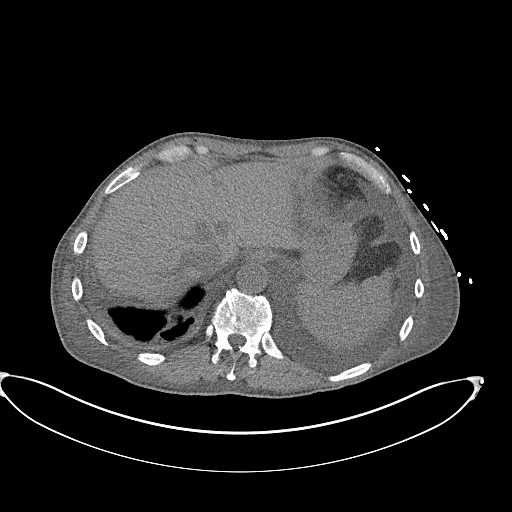
[im 70/93  lung]
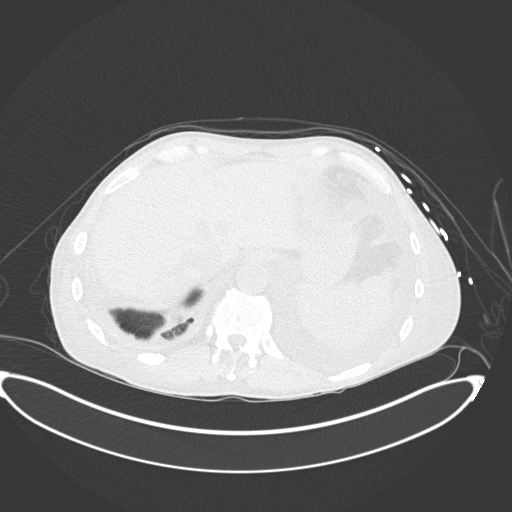
[im 81/93  soft-tissue]
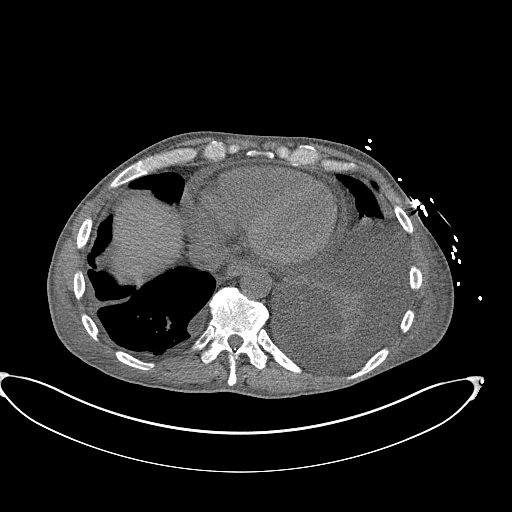
[im 81/93  lung]
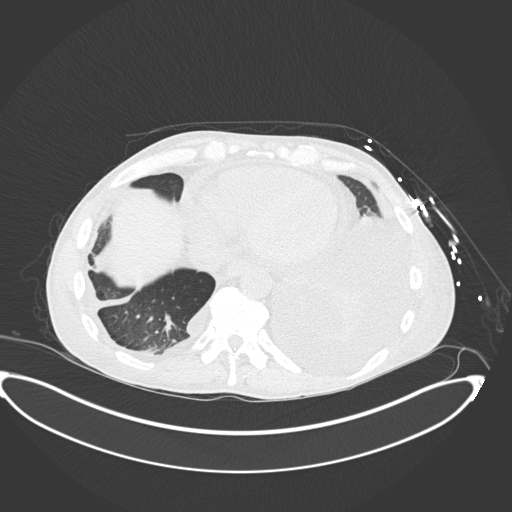

[Series 4: coronal pre · coronal · non-contrast · 0.54mm/px · 3 of 129 slices shown]
[im 33/129  soft-tissue]
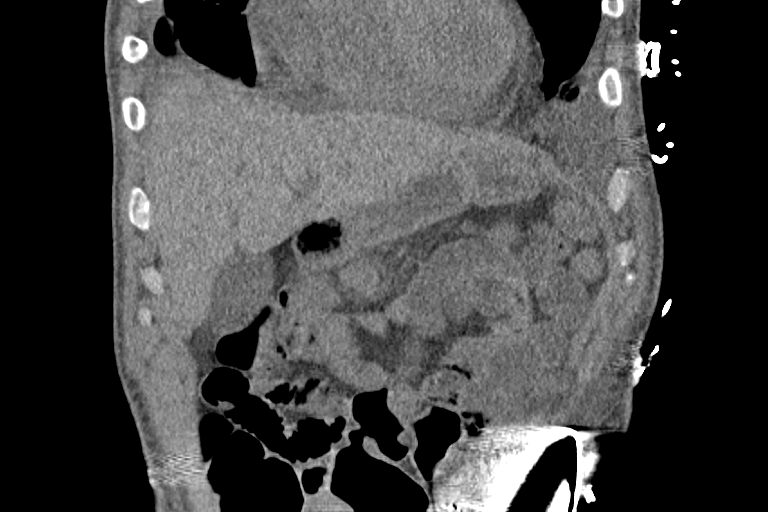
[im 65/129  soft-tissue]
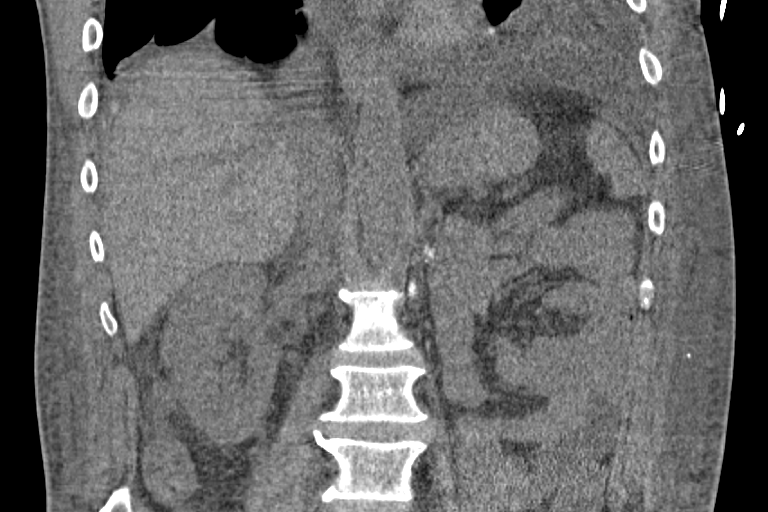
[im 97/129  soft-tissue]
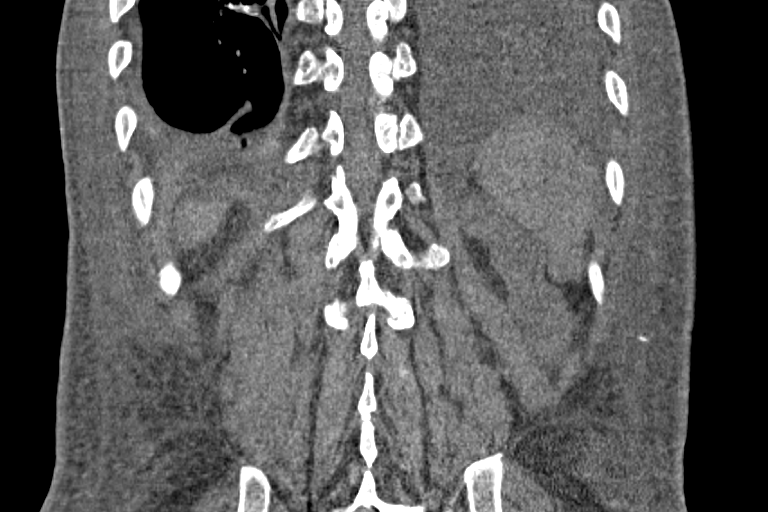

[10 of 42 positions shown; findings below may reference images not displayed]

FINDINGS: CT CHEST FINDINGS

Cardiovascular: No significant thoracic atherosclerosis. Please note
that today's exam was not performed as a CT angiogram and is not
considered highly sensitive for pulmonary embolus. Stable small
pericardial effusion.

Mediastinum/Nodes: 1.3 by 0.9 cm hypodense nodule anteriorly in the
right thyroid lobe on image 6 of series 12. Not clinically
significant; no follow-up imaging recommended (ref: [HOSPITAL]. [DATE]): 143-50).

There is background stranding in the mediastinum which matches the
background stranding in the subcutaneous tissues and likely reflects
third spacing of fluid.

Lungs/Pleura: Predominantly left lower lobe perihilar mass measures
4.3 by 3.4 cm on image 50 of series 12, previously 3.1 by 3.2 cm by
my measurements.

Right middle lobe and right upper lobe peribronchovascular mass
measures 2.4 cm along its short axis, previously 2.1 cm.

Stable thick bandlike scarring with volume loss in the right upper
lobe. Underlying emphysema. Scattered scarring in the lungs. There
is some hazy ground-glass opacity in the lungs which is increased
from prior and could be a subtle manifestation of edema or third
spacing of fluid.

Moderate to large left pleural effusion has increased from prior.
Small right pleural effusion appear stable.

Musculoskeletal: Thoracic spondylosis. Stable small anterior
sclerotic lesion in the T2 vertebral body. Anomalous medial left
second rib. Observe 0 catheter noted terminating at about the T4
level posteriorly.

CT ABDOMEN AND PELVIS FINDINGS

Hepatobiliary: No well delineated hepatic lesions are observed.
Gallbladder unremarkable.

Pancreas: Dorsal pancreatic duct dilatation extending to a
pancreatic body mass with enhancing portion of this mass measuring
3.6 by 2.0 cm on image 41 of series 7, previously 3.6 by 1.9 cm.
Along the anterior margin of the pancreatic head, a 3.1 by 2.3 cm
mass is present and previously measured 2.8 by 2.4 cm. The
pancreatic lesions are roughly stable. Questionable third small
pancreatic nodule along the pancreatic head on image 54 of series 7
measuring 0.9 cm in diameter, stable. Possible fourth pancreatic
nodule along the anterior portion of the pancreatic tail measuring
1.0 cm in diameter, stable.

Spleen: Unremarkable

Adrenals/Urinary Tract: Left nephrectomy. Adrenal glands
unremarkable. The right kidney appears unremarkable. No urinary
tract calculi are observed. Urinary bladder unremarkable.

Stomach/Bowel: No dilated bowel. Similar appearance of mild diffuse
rectal wall thickening compared to previous, possibly incidental.

Vascular/Lymphatic: Aortoiliac atherosclerotic vascular disease. No
pathologic adenopathy.

Reproductive: Unremarkable

Other: Diffuse subcutaneous and mesenteric edema suggesting third
spacing of fluid. Trace free pelvic fluid, improved from prior.
Diffuse stranding in the perirectal space.

Musculoskeletal: Left lower abdominal wall infusion pump. Necks
chloride Suriel lucent lesion of the right sacral ala probably
representing a metastatic lesion and not appreciably changed from
prior. Mild sclerosis along the SI joints. Right proximal femur
ORIF, with a lucency in the right proximal femur shown on image 76
of series 13 and also noted on prior exam. Heterotopic ossification
above the greater trochanter. Sharply defined lytic lesion of the
right inferior pubic ramus measuring 2.4 by 1.3 cm on image 217 of
series 12 with sclerotic margination, previously 2.2 by 1.3 cm.
IMPRESSION: 1. Mild enlargement of the left lower lobe perihilar mass, and of
the right middle lobe/right upper lobe peribronchovascular mass.
Increased left pleural effusion, now moderate to large. Stable small
right pleural effusion. Stable small pericardial effusion.
2. Diffuse subcutaneous, mesenteric, and mediastinal edema
compatible with third spacing of fluid. There is some hazy density
in the lungs which also may be due to noncardiogenic edema.
3. Stable appearance of multiple pancreatic masses.
4. Stable osseous manifestations of metastatic disease, with the
largest lesion in the right sacrum.
5. Emphysema and aortic atherosclerosis.

Aortic Atherosclerosis (E7E6Z-CPO.O) and Emphysema (E7E6Z-1R9.5).

## 2022-12-06 IMAGING — DX DG CHEST 1V PORT
1 series · 1 of 1 positions shown · non-contrast
Comparison: CT chest and chest radiograph from January 31, 2021.

CLINICAL DATA: Status post left thoracentesis.

EXAM:
PORTABLE CHEST 1 VIEW

[chest ap]
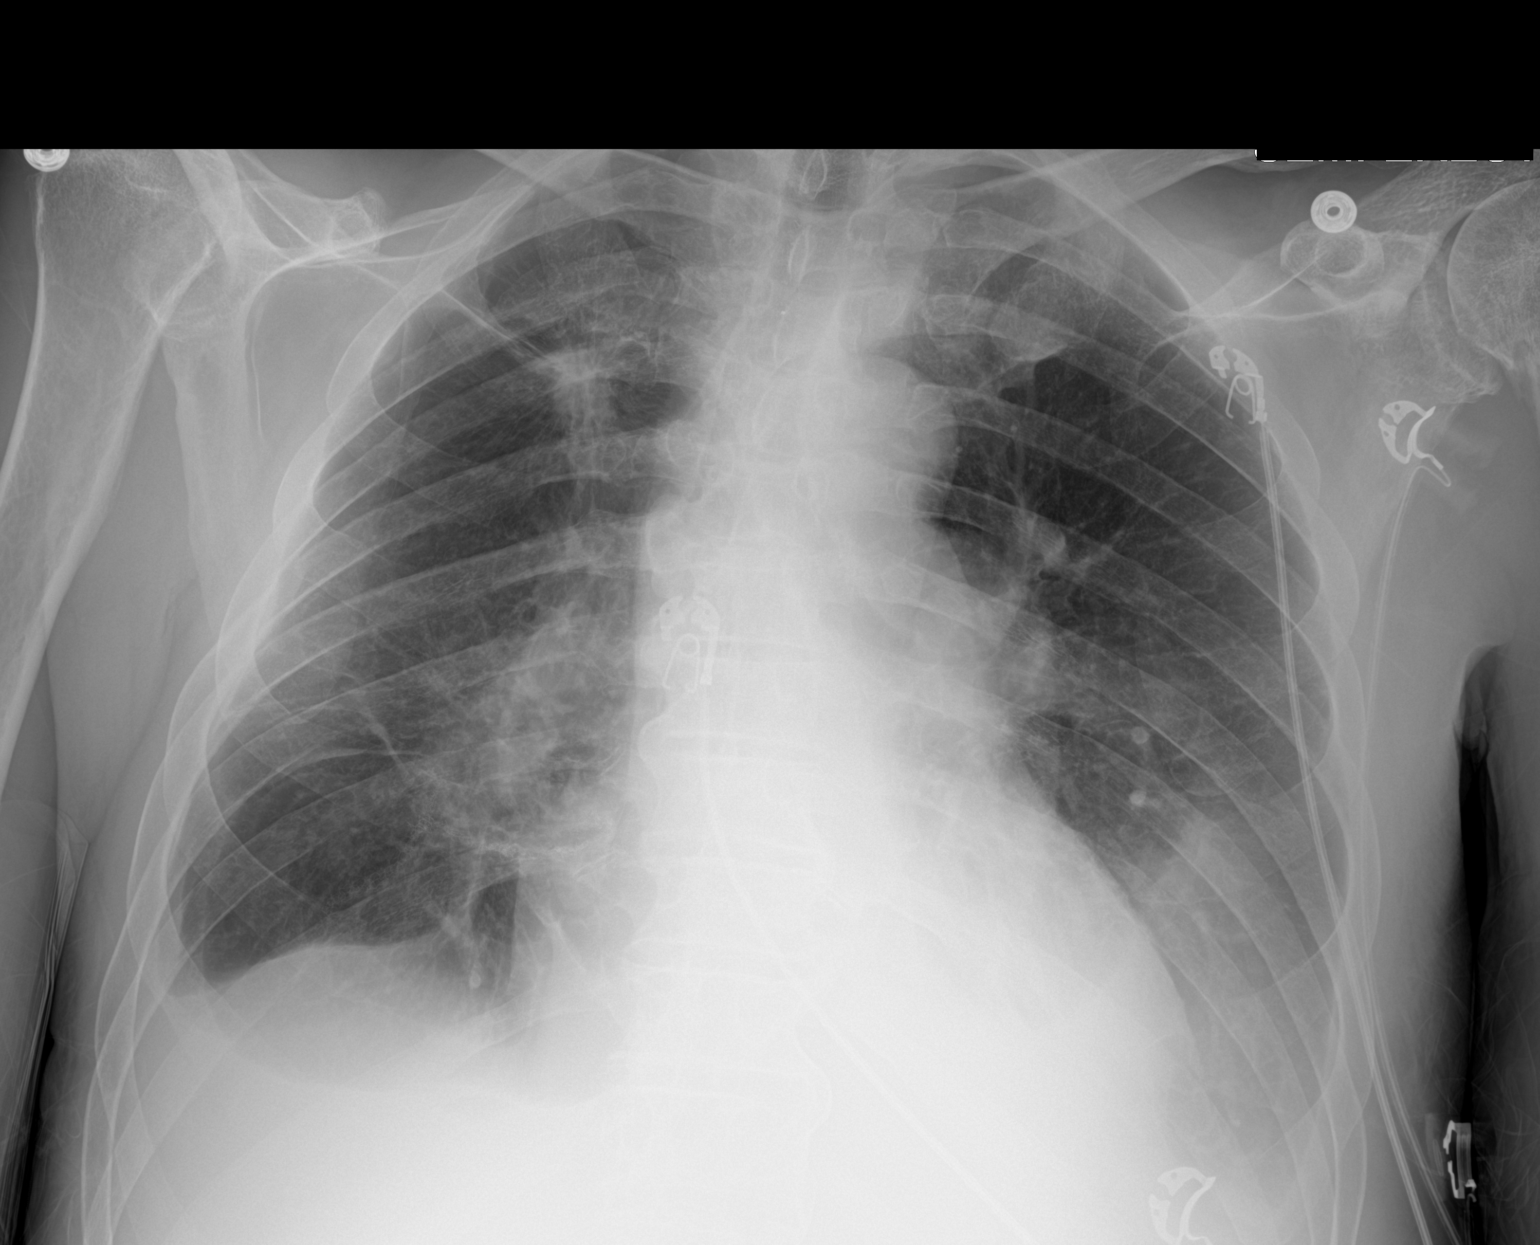

[1 of 1 positions shown; findings below may reference images not displayed]

FINDINGS: Right suprahilar and left infrahilar metastatic disease, better
characterized on recent CT chest. Interval left thoracentesis with
decreased size of a layering small left pleural effusion. Hazy left
basilar opacity. No visible pneumothorax on this semi erect
radiograph. Similar small right pleural effusion. Similar enlarged
cardiac silhouette.
IMPRESSION: 1. Interval left thoracentesis with decreased layering small left
pleural effusion. Hazy left basilar opacity likely is secondary to
layering pleural effusion and atelectasis. No visible pneumothorax
on this semi erect radiograph.
2. Similar small right pleural effusion.
3. Right suprahilar and left infrahilar metastatic disease, better
characterized on recent CT chest.

## 2022-12-21 IMAGING — US US RENAL
1 series · 14 of 16 positions shown · non-contrast
Comparison: CT of the abdomen pelvis January 31, 2021

CLINICAL DATA: Acute renal insufficiency

EXAM:
RENAL / URINARY TRACT ULTRASOUND COMPLETE

[Series 1: us renal · 14 of 16 slices shown]
[im 1/16]
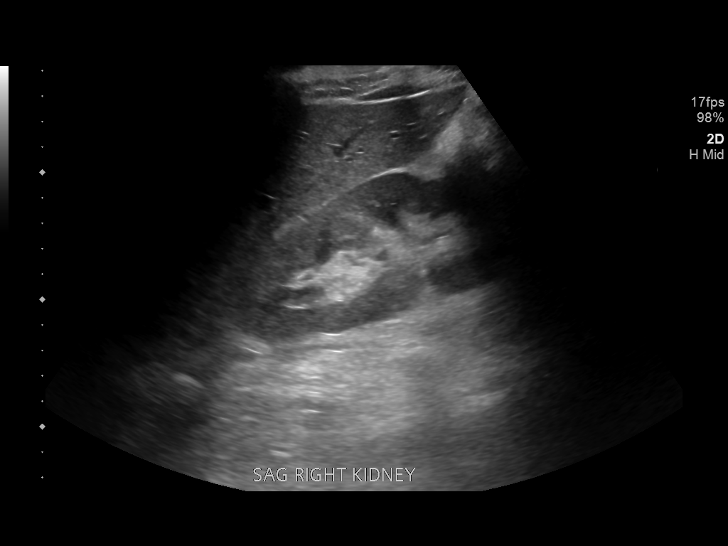
[im 2/16]
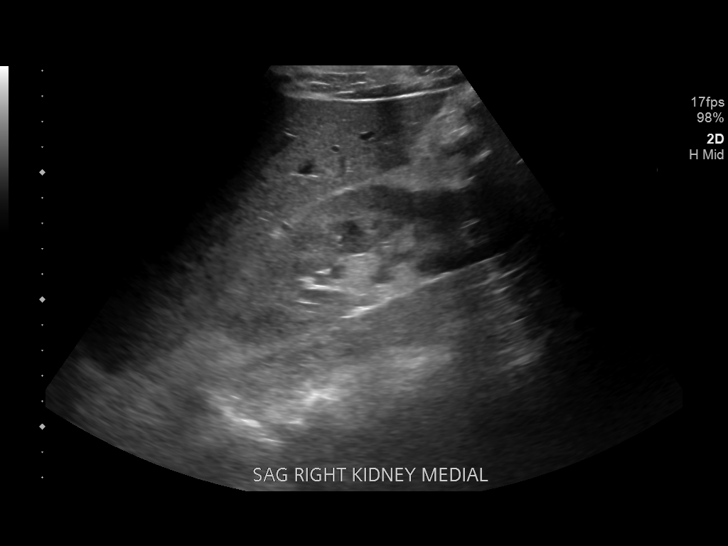
[im 3/16]
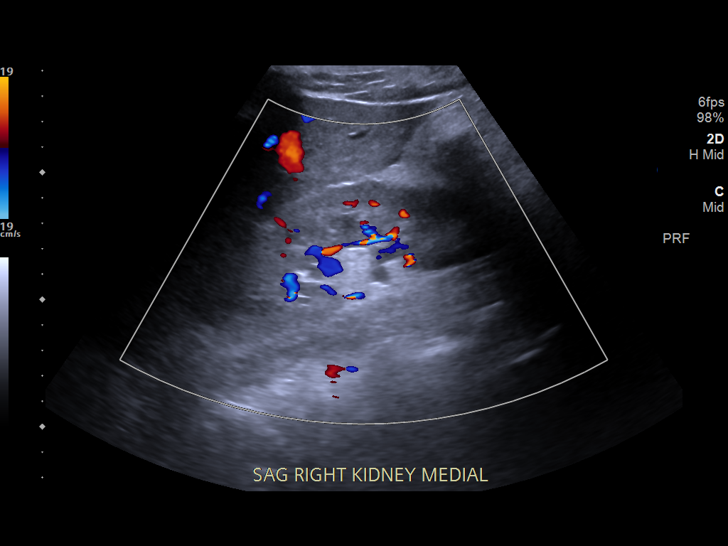
[im 5/16]
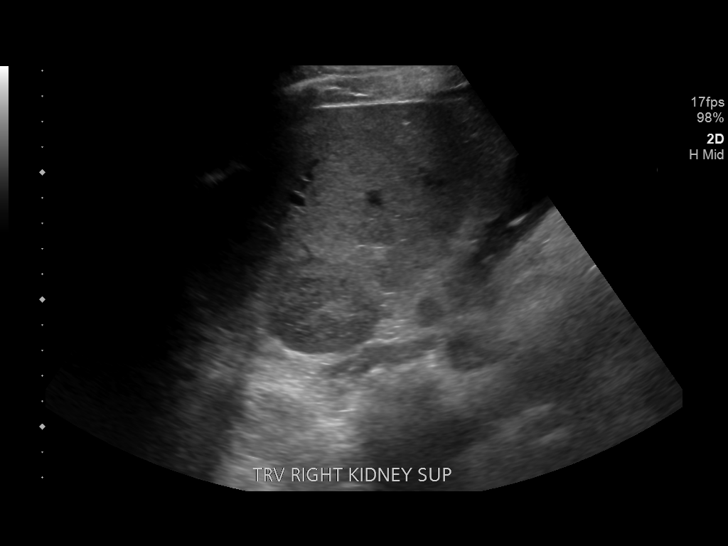
[im 6/16]
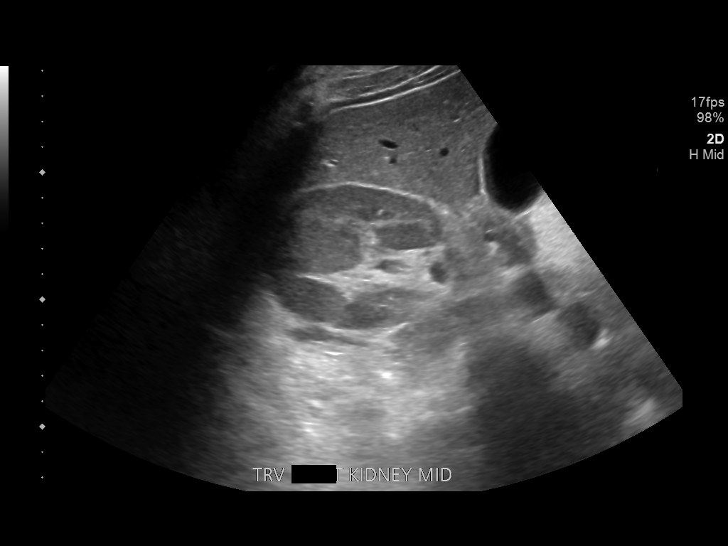
[im 7/16]
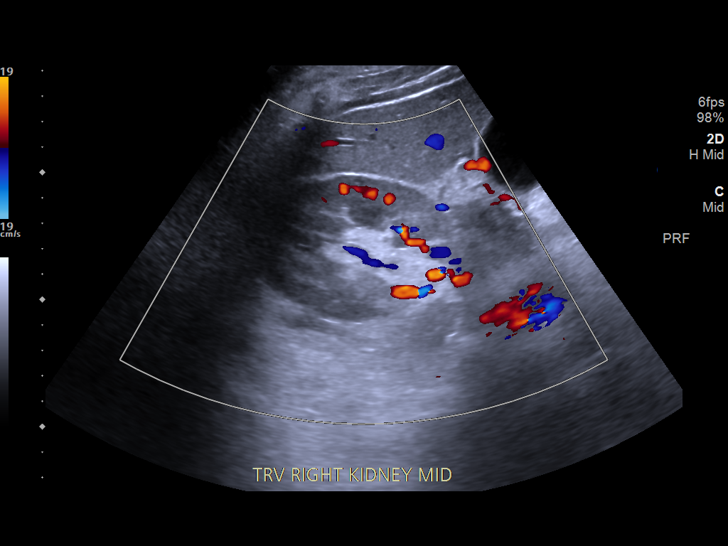
[im 8/16]
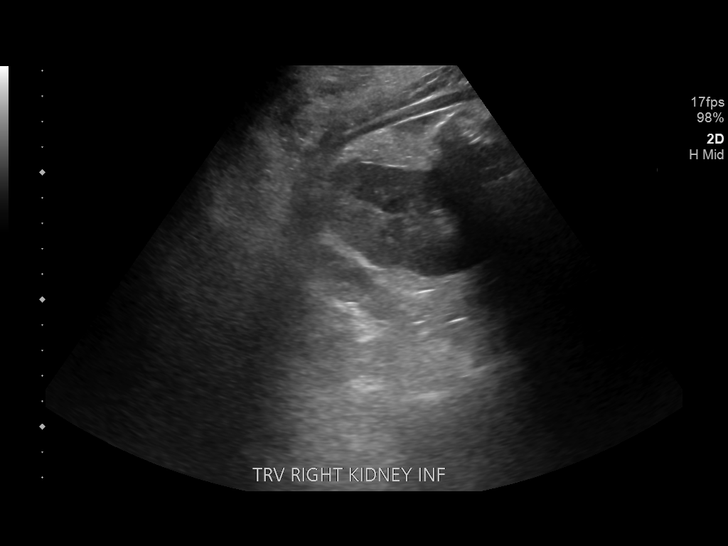
[im 9/16]
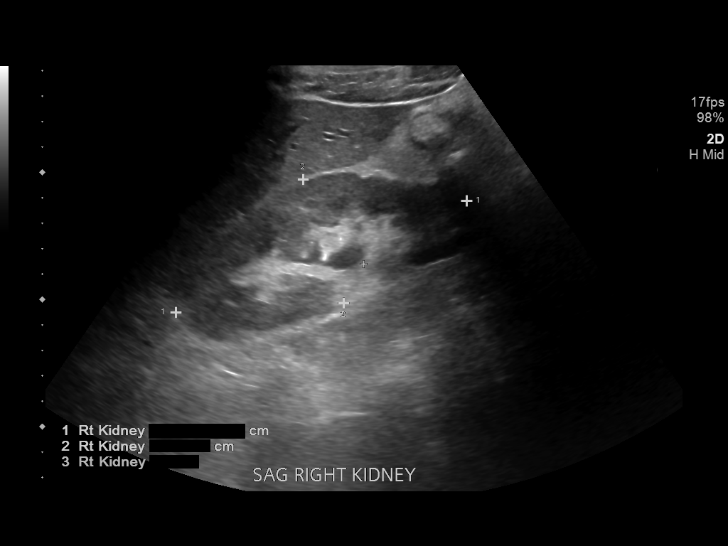
[im 10/16]
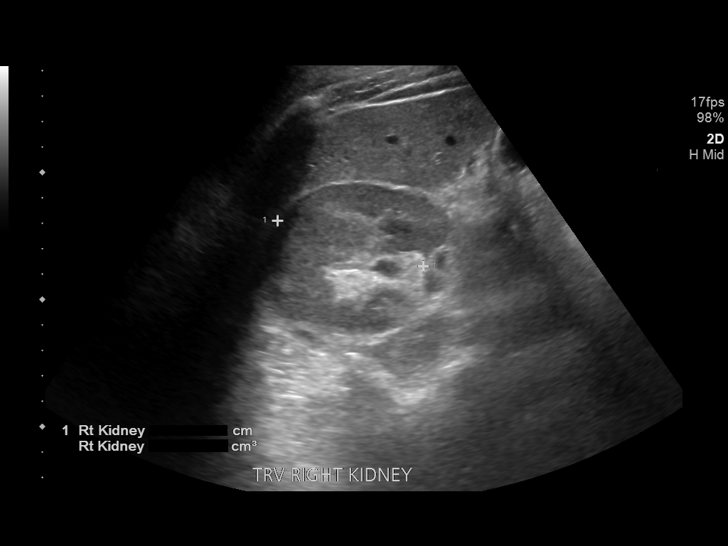
[im 11/16]
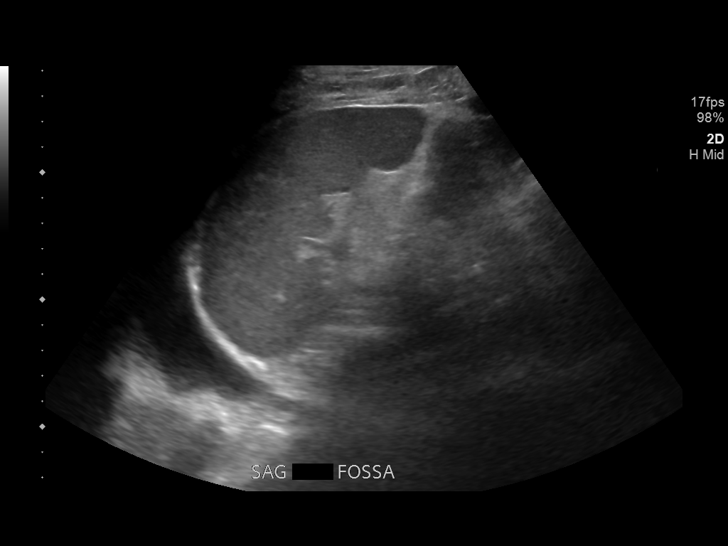
[im 13/16]
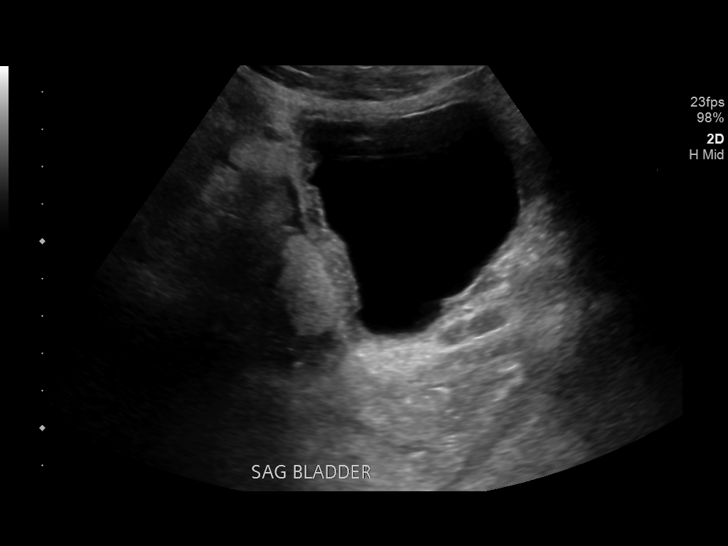
[im 14/16]
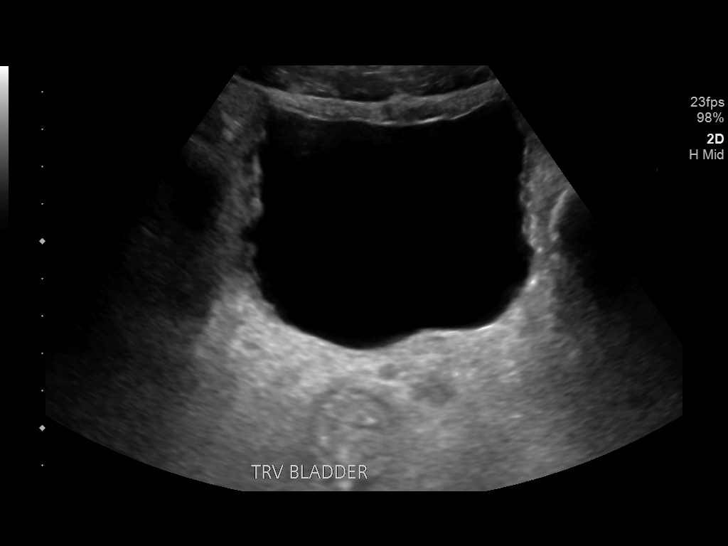
[im 15/16]
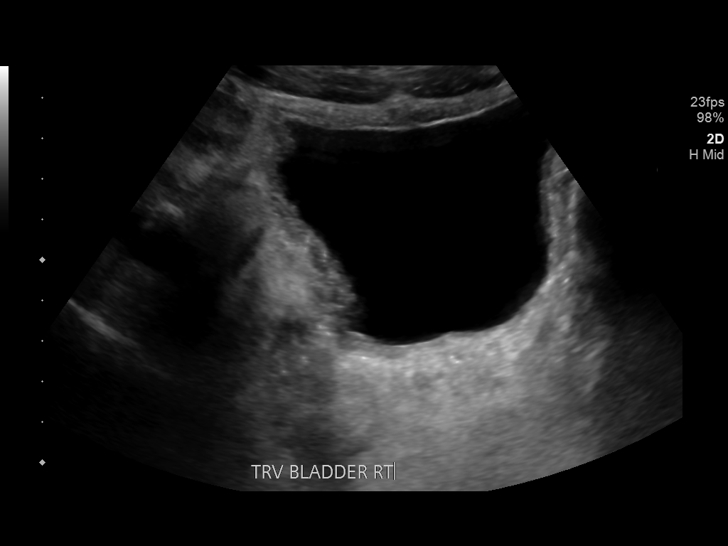
[im 16/16]
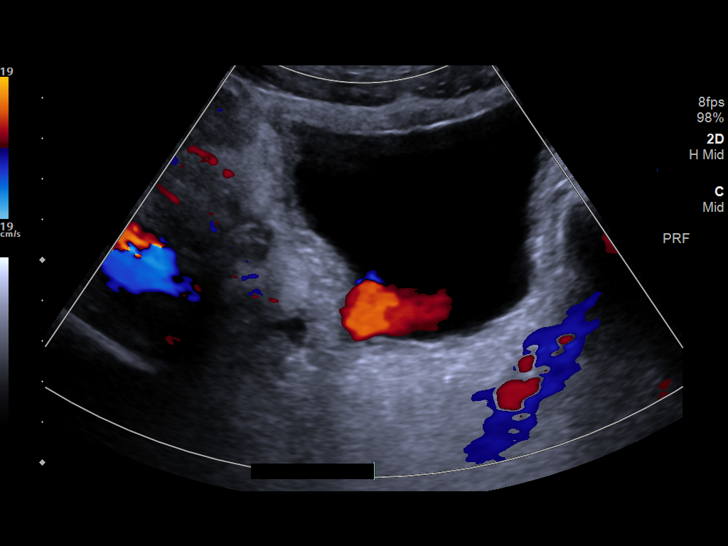

[14 of 16 positions shown; findings below may reference images not displayed]

FINDINGS: Right Kidney:

Renal measurements: 12.3 x 5.1 x 6.0 cm = volume: 197 mL.
Echogenicity within normal limits. No mass or hydronephrosis
visualized.

Left Kidney:

Surgically absent

Bladder:

Irregular thickening of the right hand side of the urinary bladder
noted.

Other:

None.
IMPRESSION: 1. Normal appearance of the right kidney.
2. Surgical absence of the left kidney.
3. Irregular thickening of the right hand side of the urinary
bladder. Given patient's history this may represent metastatic
disease, or cystitis.

## 2022-12-21 IMAGING — DX DG CHEST 1V PORT
1 series · 1 of 1 positions shown · non-contrast
Comparison: Portable chest 02/01/2021. Restaging CT Chest, Abdomen,
and Pelvis 01/31/2021.

CLINICAL DATA: 54-year-old male with shortness of breath.
Metastatic renal cell carcinoma.

EXAM:
PORTABLE CHEST 1 VIEW

[chest ap]
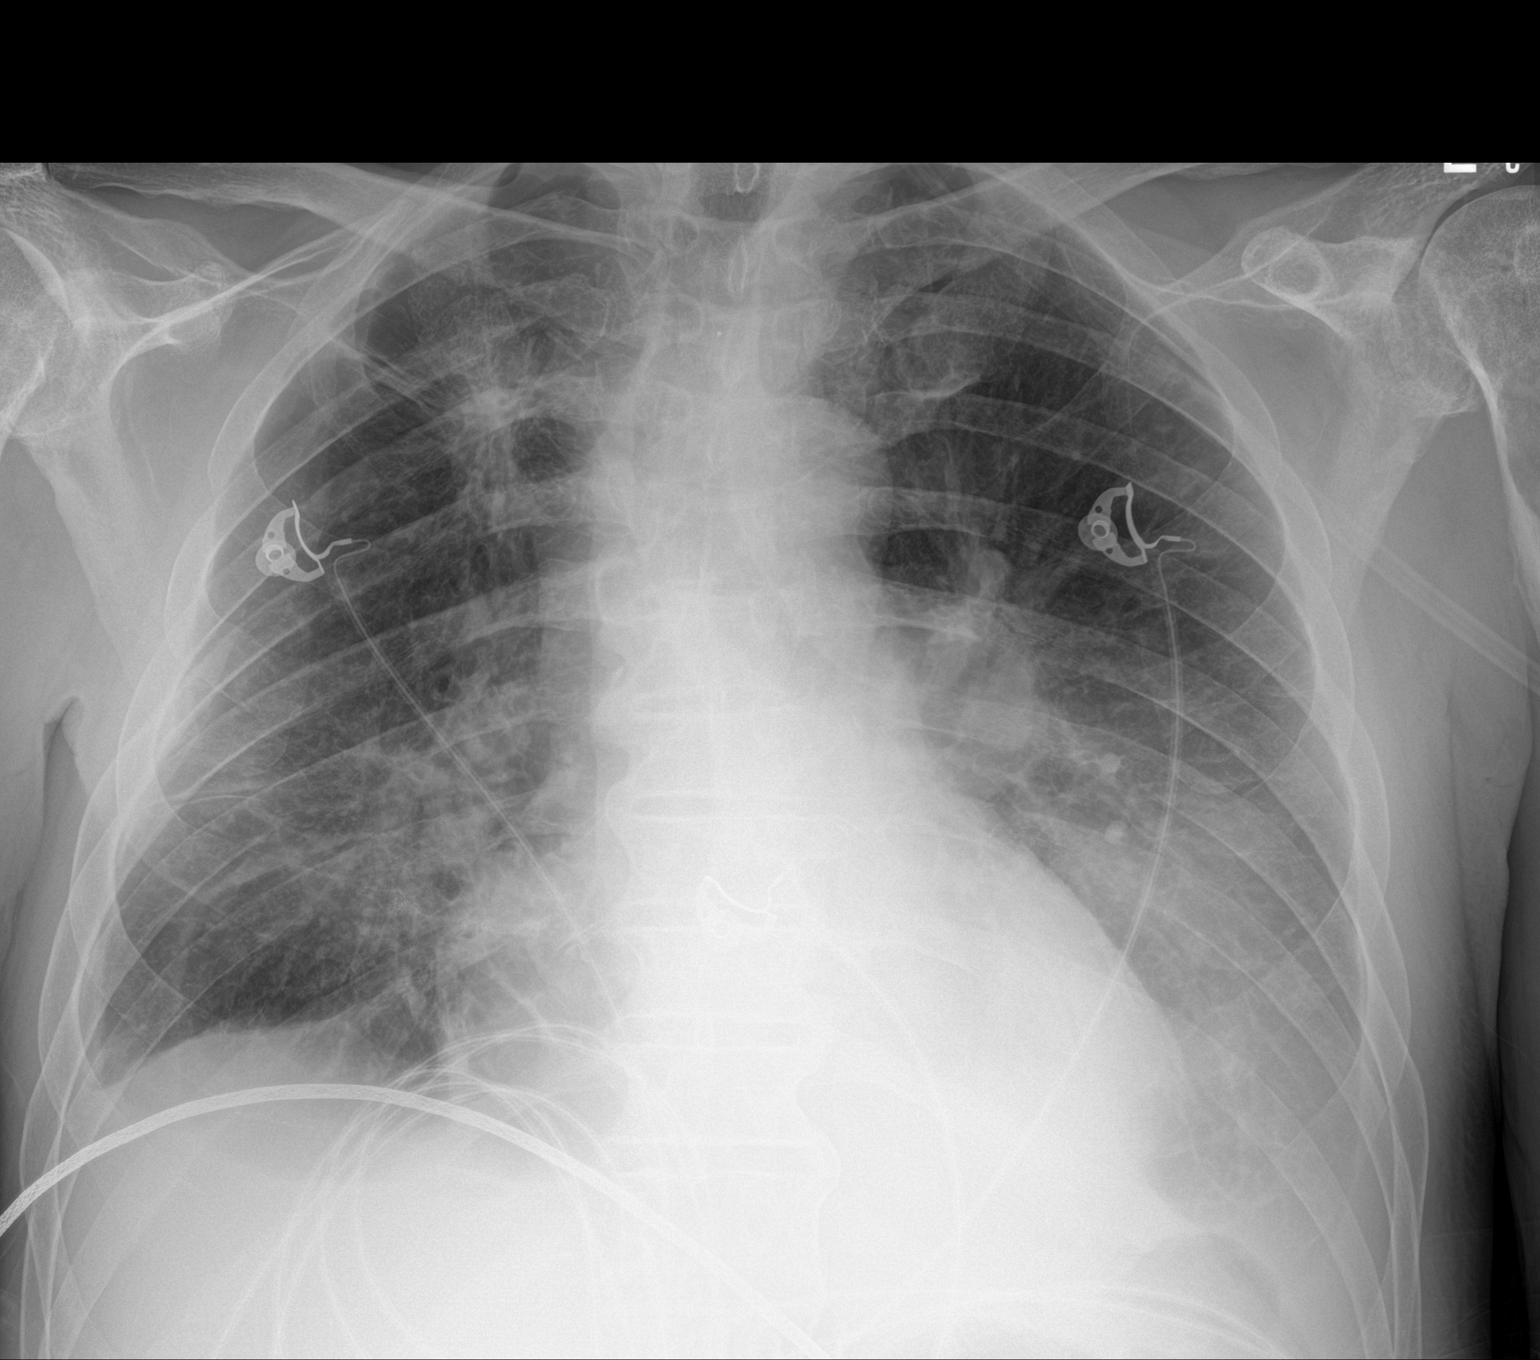

[1 of 1 positions shown; findings below may reference images not displayed]

FINDINGS: Portable AP semi upright view at 0904 hours. Veiling left pleural
effusion not significantly changed from the restaging CTs earlier
this month. Trace right pleural effusion or pleural scarring. Left
hilar mass radiographically stable. Left lower lobe collapse or
consolidation does appear mildly progressed from the CTs. Other
mediastinal contours are stable. Spiculated architectural distortion
in the right upper lung is stable. No pneumothorax. No pulmonary
edema. Visualized tracheal air column is within normal limits.
Stable visualized osseous structures.
IMPRESSION: 1. Left pleural effusion not significantly changed from the
restaging CTs earlier this month, although associated left lower
lobe collapse or consolidation does appear increased.
2. Left hilar mass radiographically stable. Right upper lobe
spiculated mass versus architectural distortion is stable.

## 2022-12-21 IMAGING — DX DG CHEST 1V PORT
1 series · 1 of 1 positions shown · non-contrast
Comparison: Chest x-ray 02/16/2019.

CLINICAL DATA: 54-year-old male with history of shortness of
breath.

EXAM:
PORTABLE CHEST 1 VIEW

[chest ap]
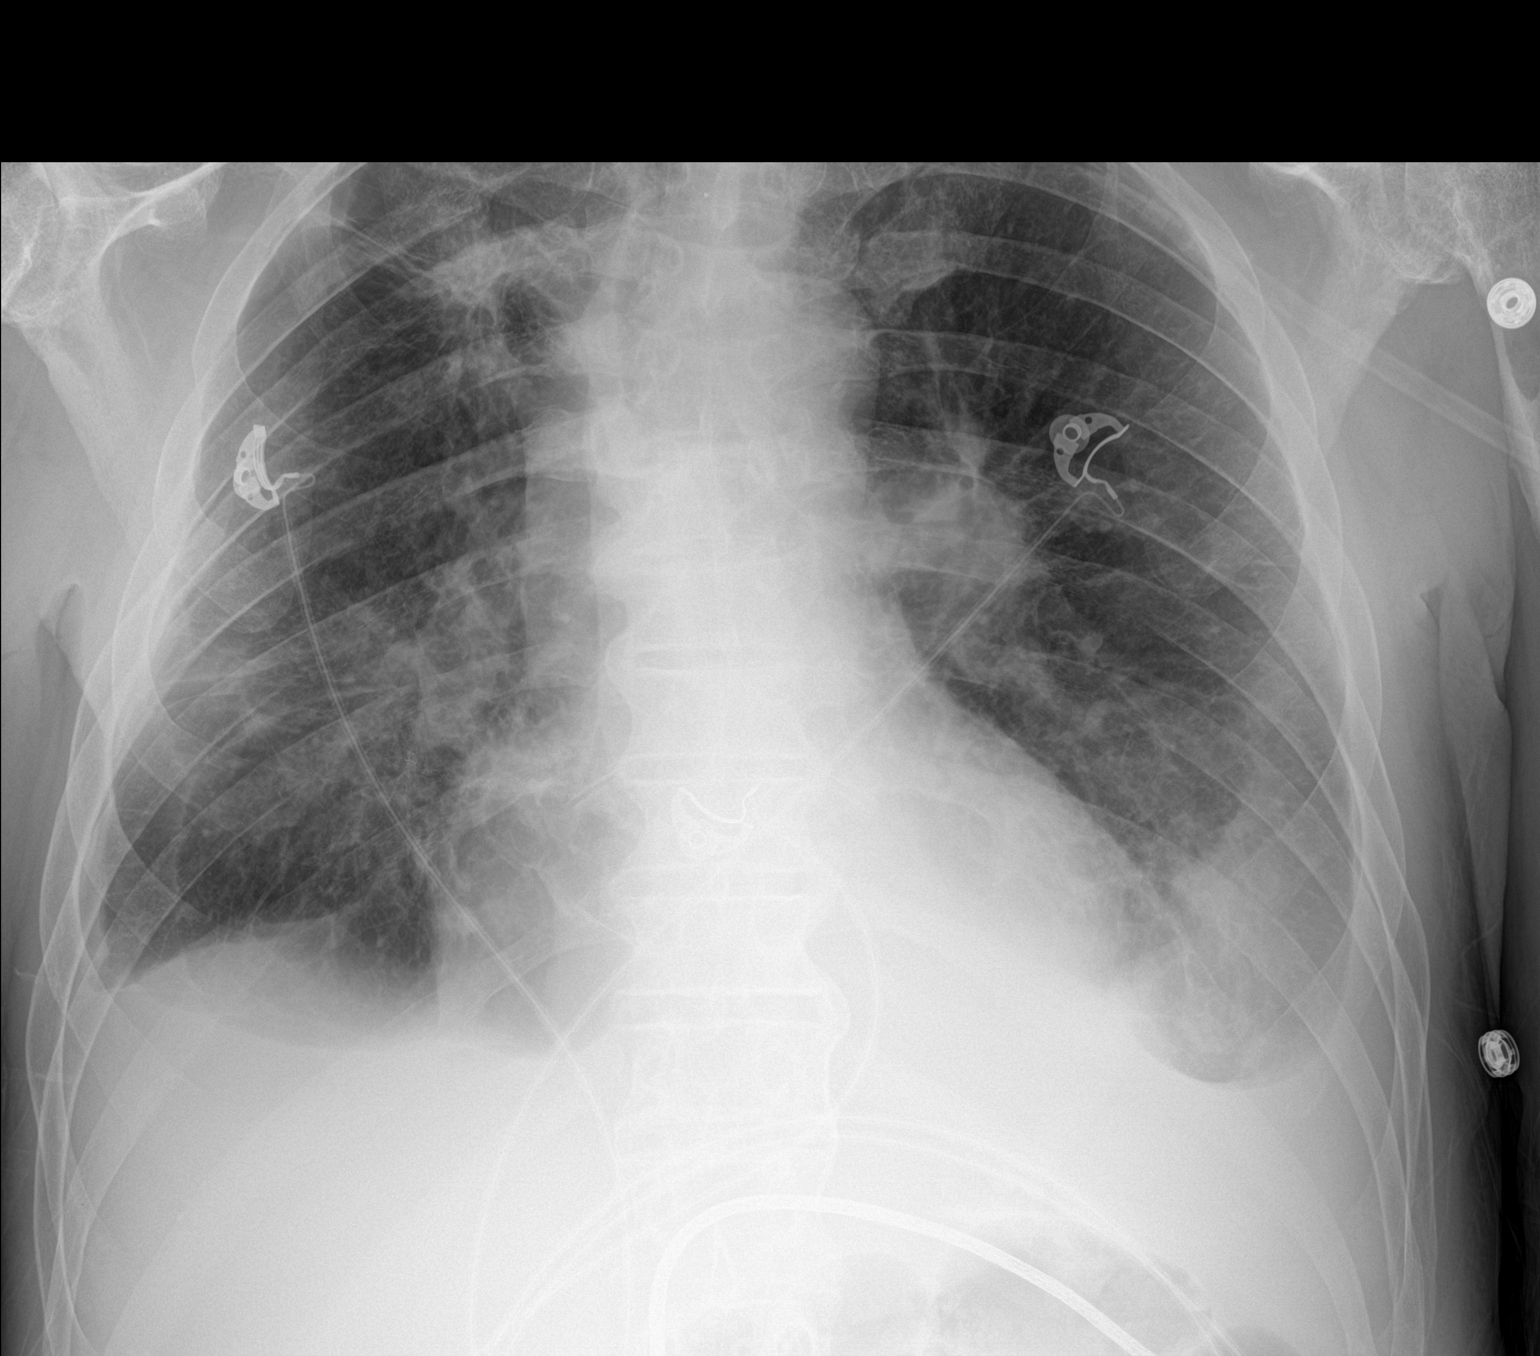

[1 of 1 positions shown; findings below may reference images not displayed]

FINDINGS: Multiple bilateral pulmonary nodules and masses are again noted,
bulkiest of which appears in the left perihilar region corresponding
to large central left lower lobe lesion better demonstrated on prior
chest CT 01/31/2021. Moderate bilateral pleural effusions with
bibasilar opacities which may reflect areas of atelectasis and/or
consolidation, similar to the recent prior examination. No
pneumothorax. No evidence of pulmonary edema. Heart size is normal.
IMPRESSION: 1. Widespread metastatic disease to the lungs redemonstrated with
moderate bilateral pleural effusions measure roughly stable in size
compared to the prior examination, and associated with persistent
areas of bibasilar atelectasis and/or consolidation.

## 2023-01-22 IMAGING — CR DG SHOULDER 2+V*L*
3 series · 3 of 3 positions shown · non-contrast
Comparison: None.

CLINICAL DATA: Fall, left arm pain

EXAM:
LEFT SHOULDER - 2+ VIEW

[x shoulder ap left (1 of 3)]
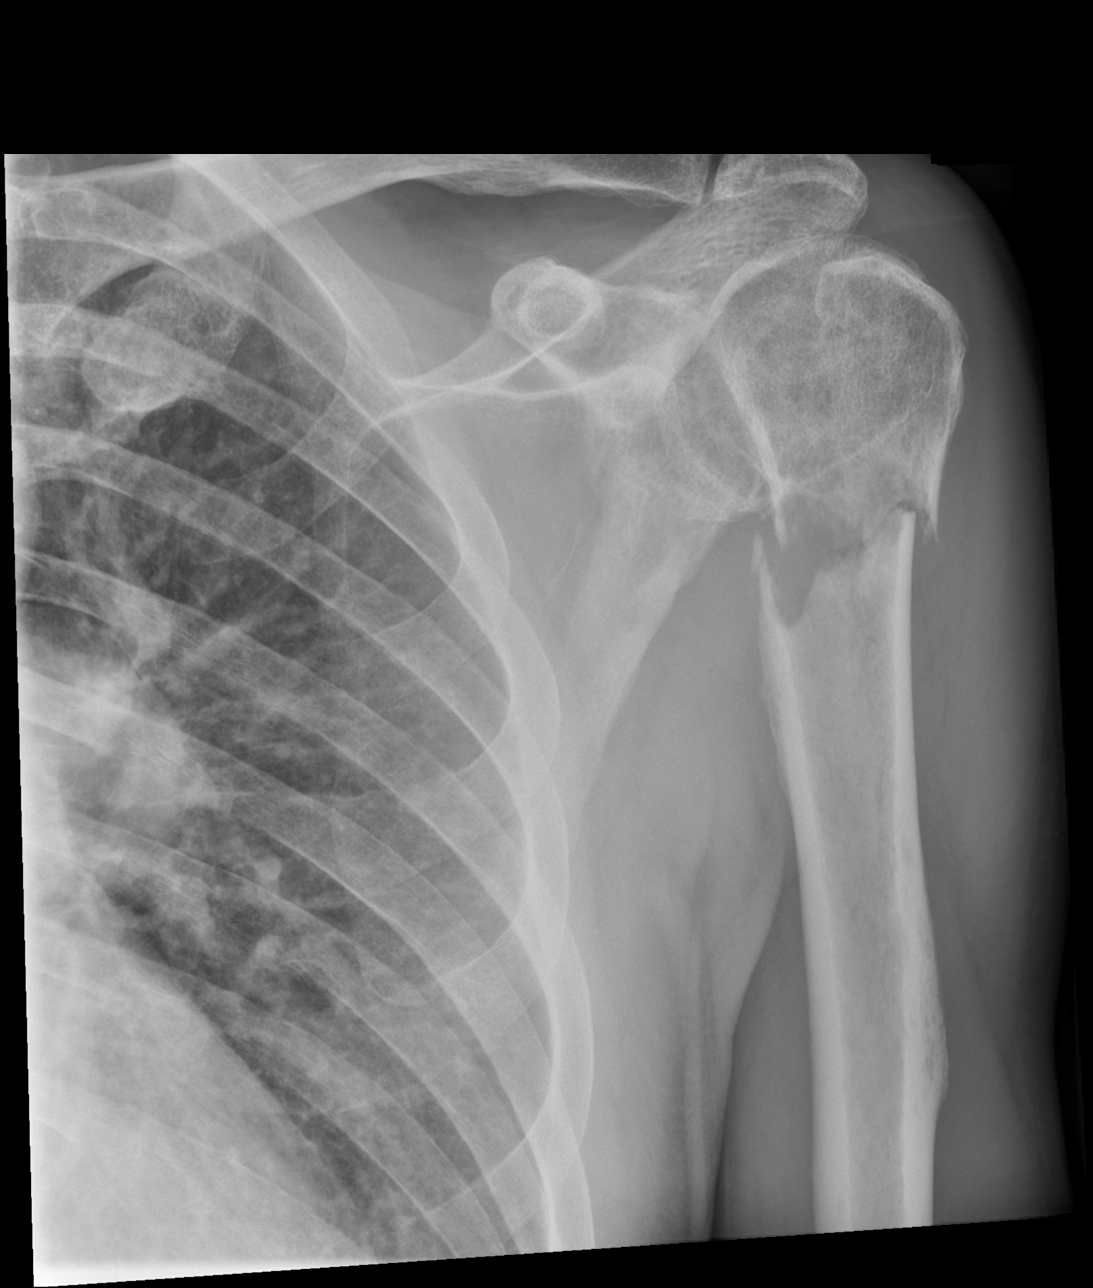

[x shoulder ap left (2 of 3)]
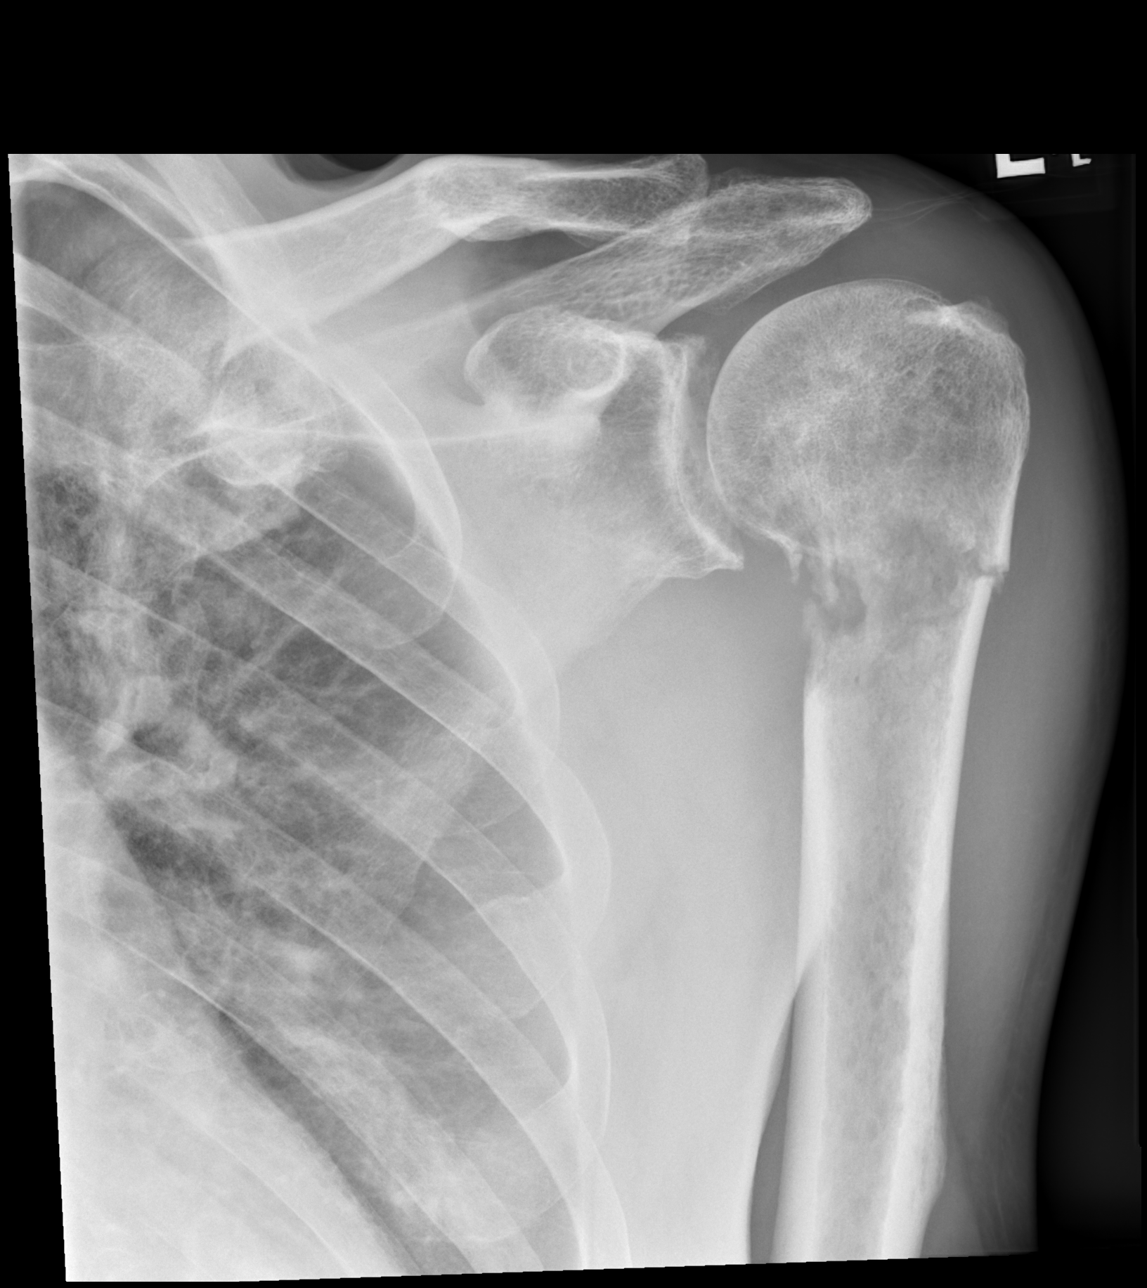

[x shoulder ap left (3 of 3)]
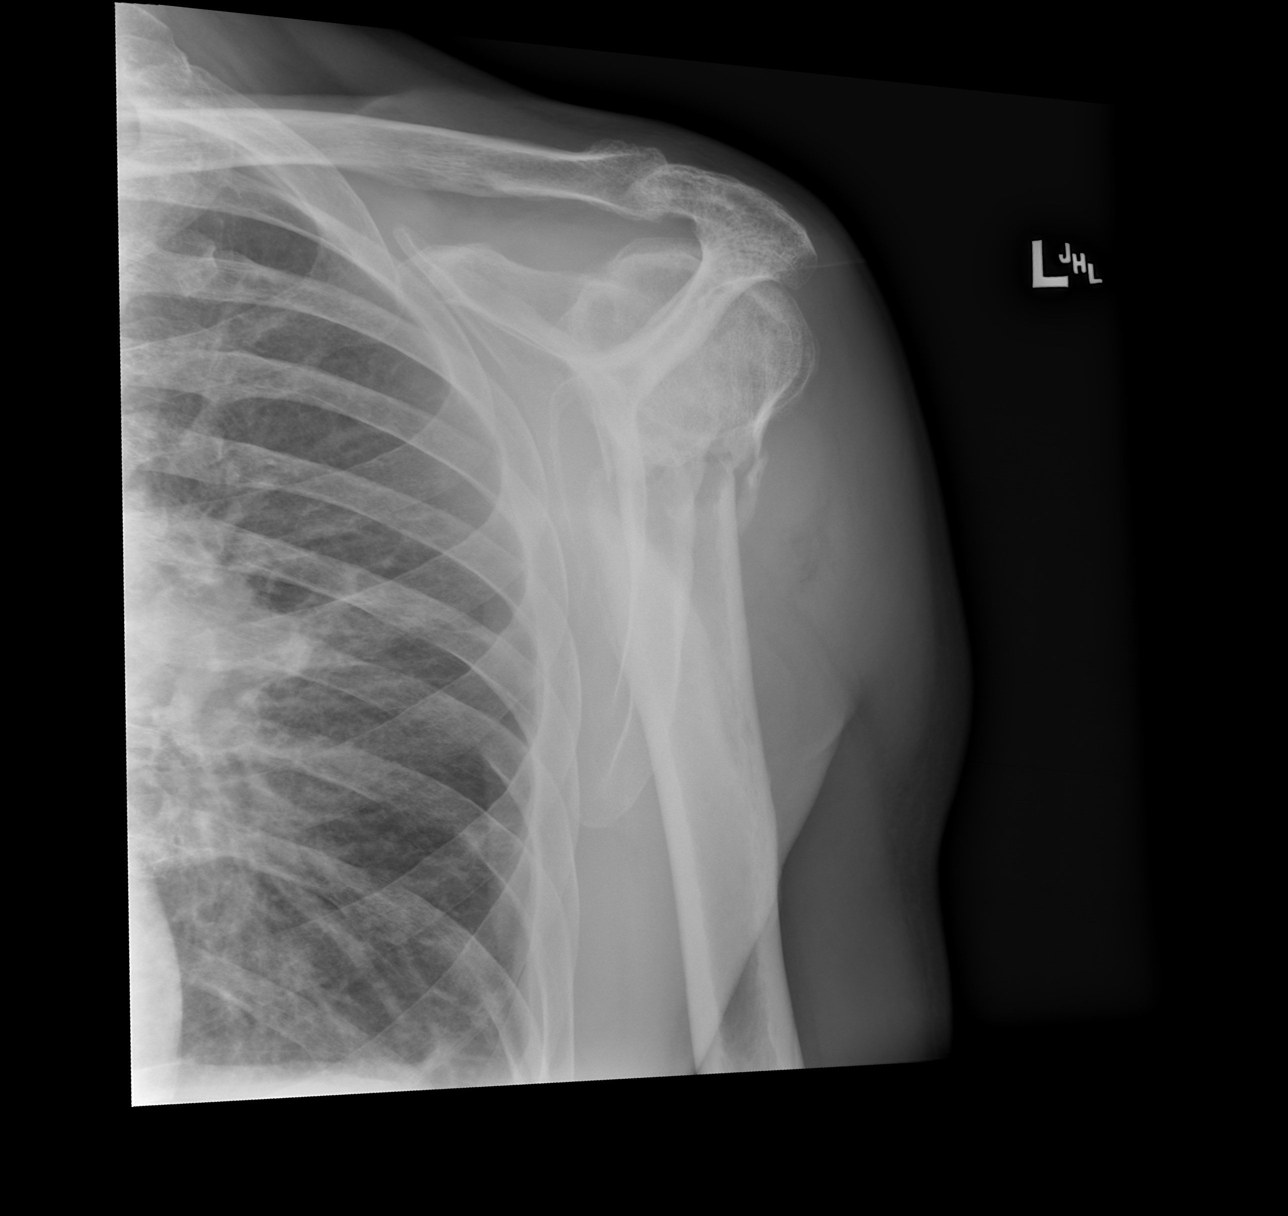

[3 of 3 positions shown; findings below may reference images not displayed]

FINDINGS: There is a a lytic lesion involving the a surgical neck of the
humerus complicated by a pathologic fracture with 2-3 cortical with
medial displacement of the distal fracture fragment comprising the
humeral shaft. Given the findings noted on recent CT examination of
01/31/2021, this likely represents a a metastatic focus of the
patient's underlying renal cell carcinoma. The humeral head is still
well seated within the glenoid fossa. Acromioclavicular joint space
is preserved. Glenohumeral joint space is preserved. Limited
evaluation of the left hemithorax is unremarkable.
IMPRESSION: Lytic lesion within the a proximal left humerus complicated by
pathologic fracture involving the surgical neck of the humerus with
fracture fragments in near anatomic alignment.

## 2023-02-06 IMAGING — XA IR EMBO TUMOR ORGAN ISCHEMIA INFARCT INC GUIDE ROADMAPPING
13 of 18 series · 13 of 24 positions shown · IV contrast (IODINE)
Comparison: none

INDICATION: 54-year-old male with a history of renal cell carcinoma with osseous
metastatic disease. He has a pathologic fracture of the proximal
left humerus secondary to a hypervascular metastasis. He presents
for preoperative embolization prior to open reduction and internal
fixation.

[Series 1: body 4 care · 8 acquisitions, 1 frame shown (1 of 13)]
[im 1/8]
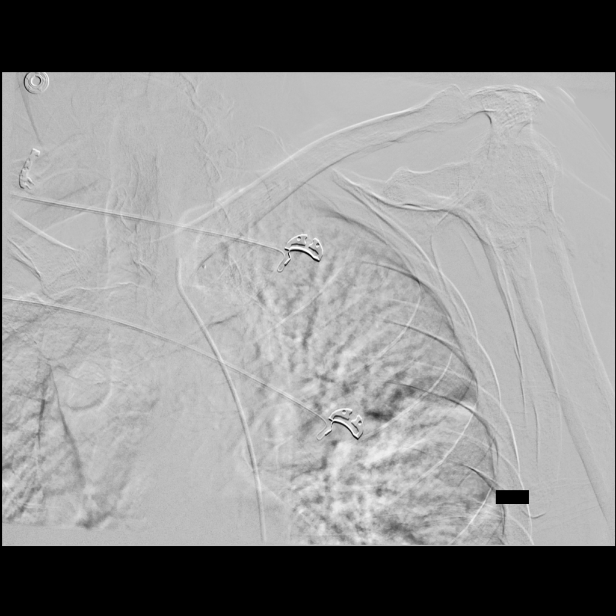

[Series 2: body 4 care · 9 acquisitions, 1 frame shown (2 of 13)]
[im 1/9]
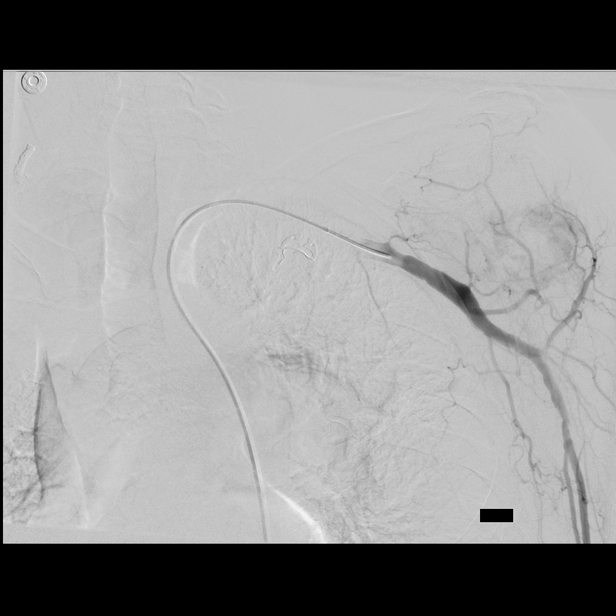

[Series 4: body 4 care · 11 acquisitions, 1 frame shown (3 of 13)]
[im 1/11]
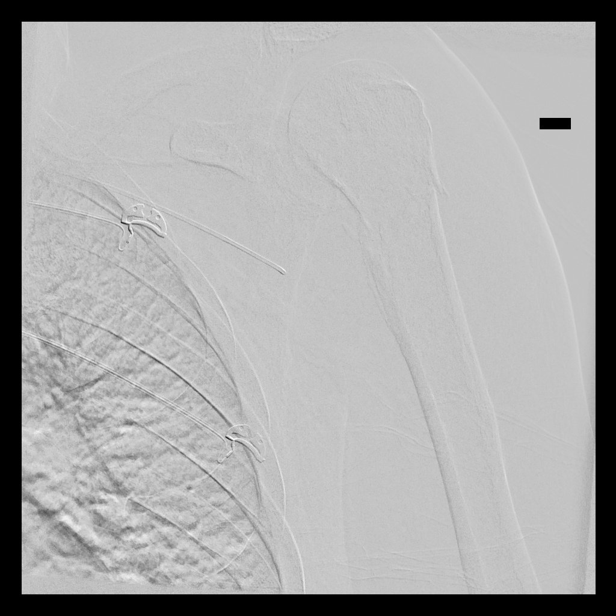

[Series 5: body 4 care · 4 acquisitions, 1 frame shown (4 of 13)]
[im 1/4]
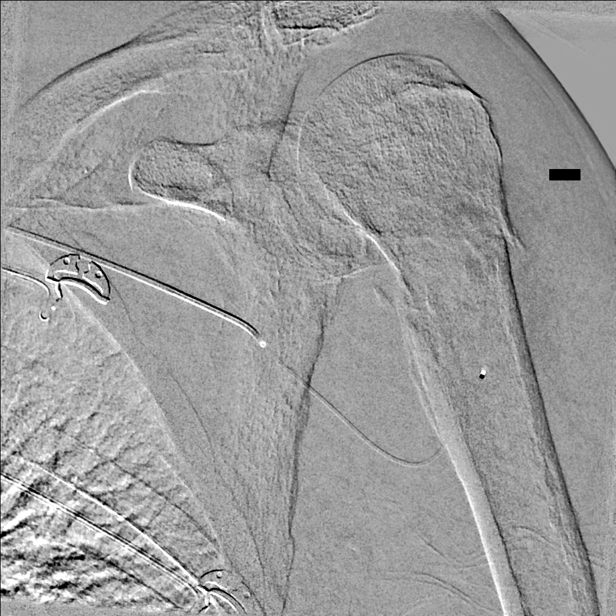

[Series 7: body 4 care · 7 acquisitions, 1 frame shown (5 of 13)]
[im 1/7]
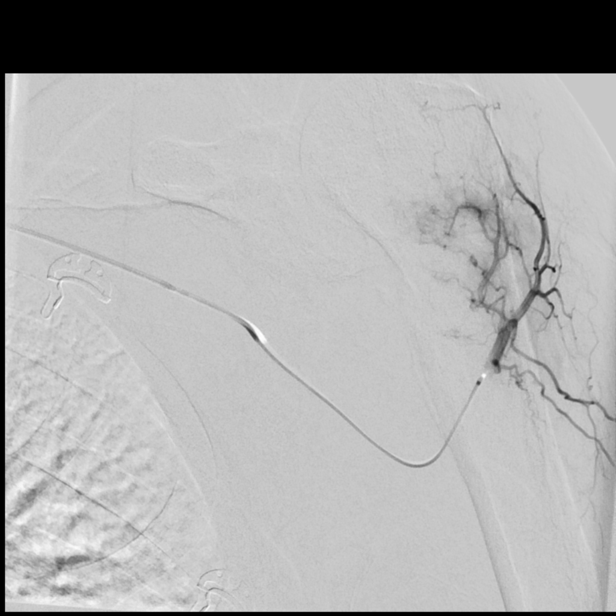

[Series 9: body 4 care · 8 acquisitions, 1 frame shown (6 of 13)]
[im 1/8]
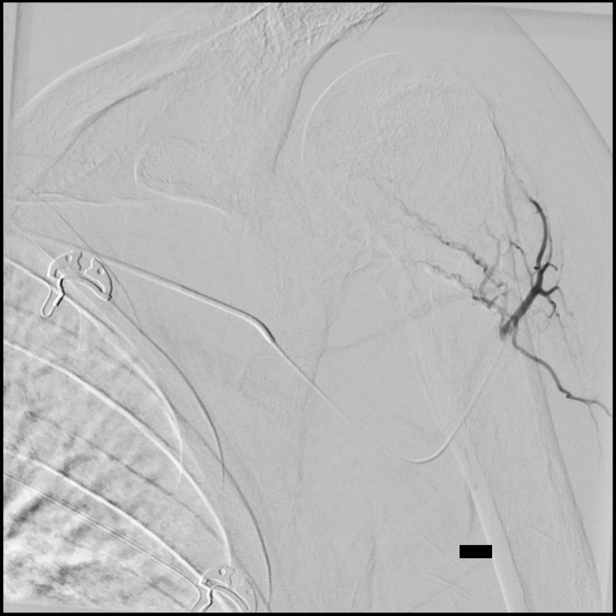

[Series 10: body 4 care · 8 acquisitions, 1 frame shown (7 of 13)]
[im 1/8]
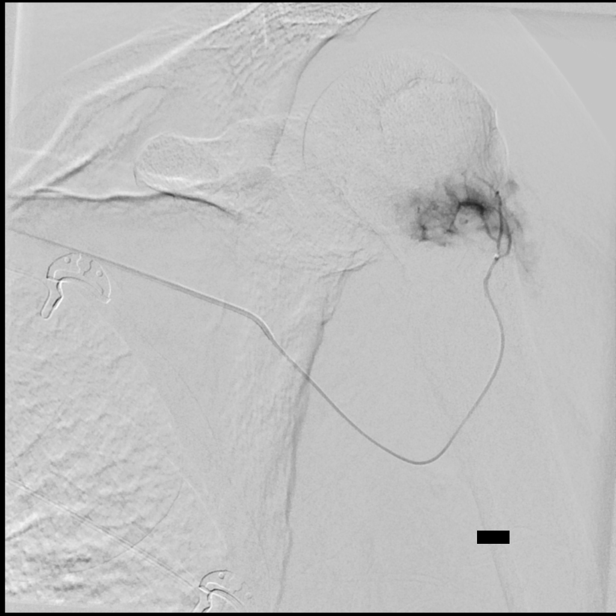

[Series 11: body 4 care · 14 acquisitions, 1 frame shown (8 of 13)]
[im 1/14]
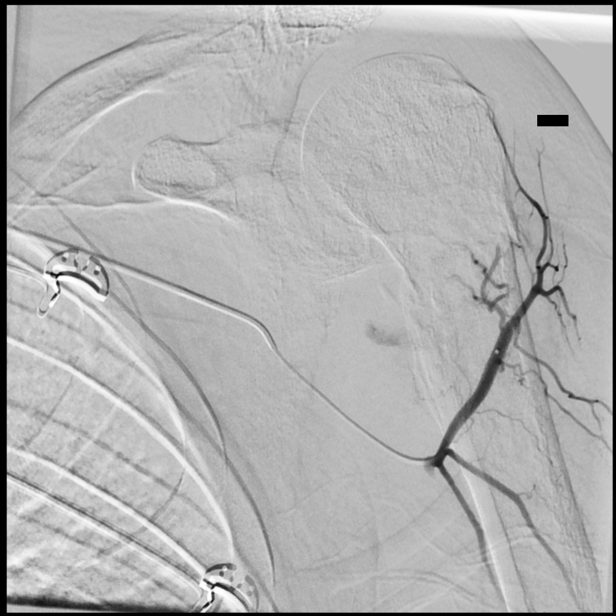

[Series 12: body 4 care · 1 of 18 slices shown (9 of 13)]
[im 18/18]
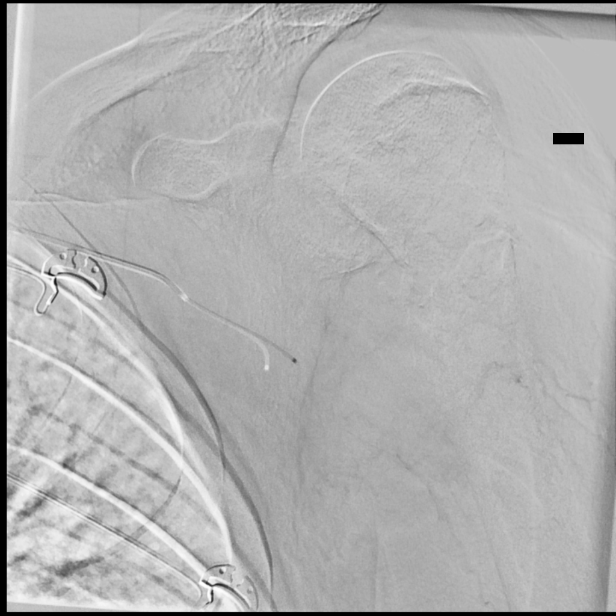

[Series 14: body 4 care · 7 acquisitions, 1 frame shown (10 of 13)]
[im 1/7]
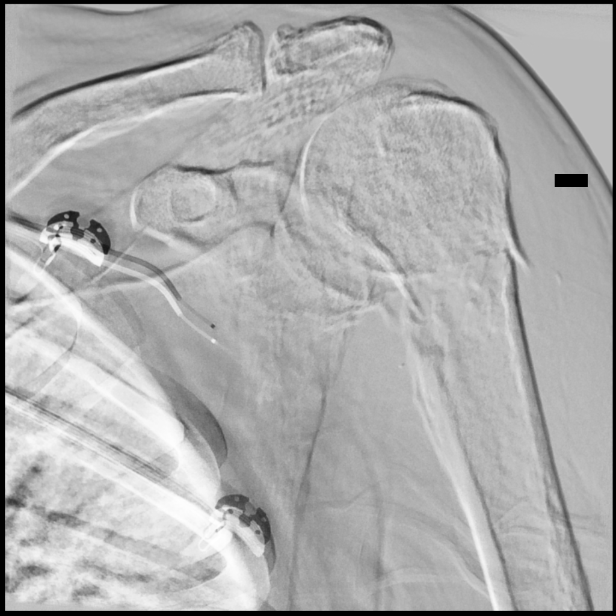

[Series 15: body 4 care · 16 acquisitions, 1 frame shown (11 of 13)]
[im 1/16]
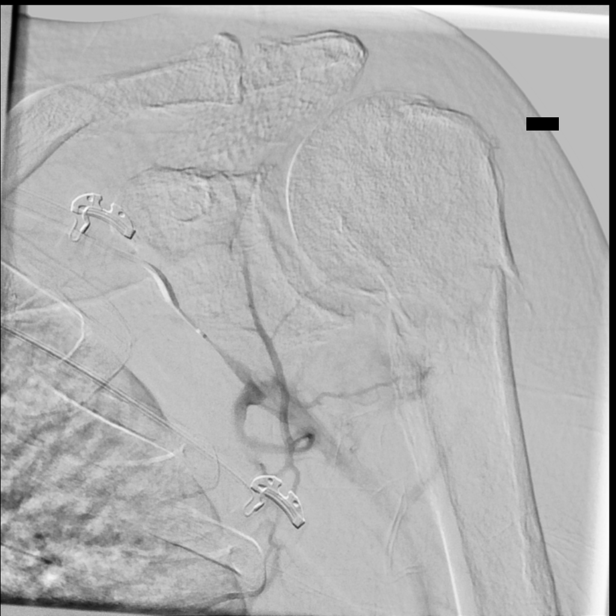

[Series 17: body 4 care · 10 acquisitions, 1 frame shown (12 of 13)]
[im 1/10]
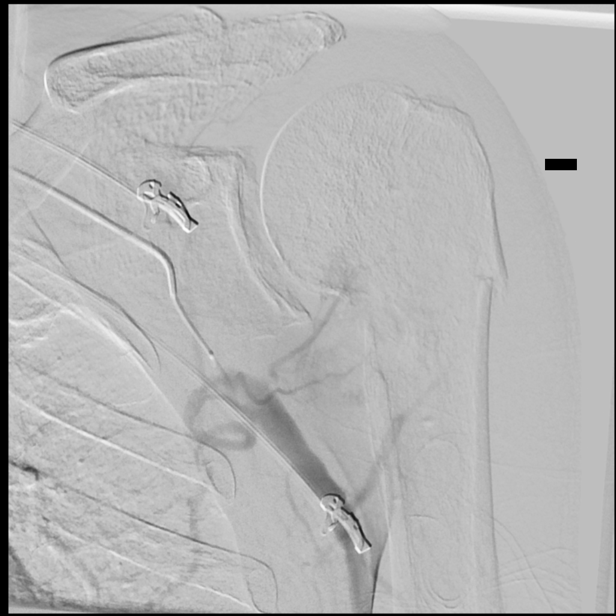

[Series 18: body 4 care · 10 acquisitions, 1 frame shown (13 of 13)]
[im 1/10]
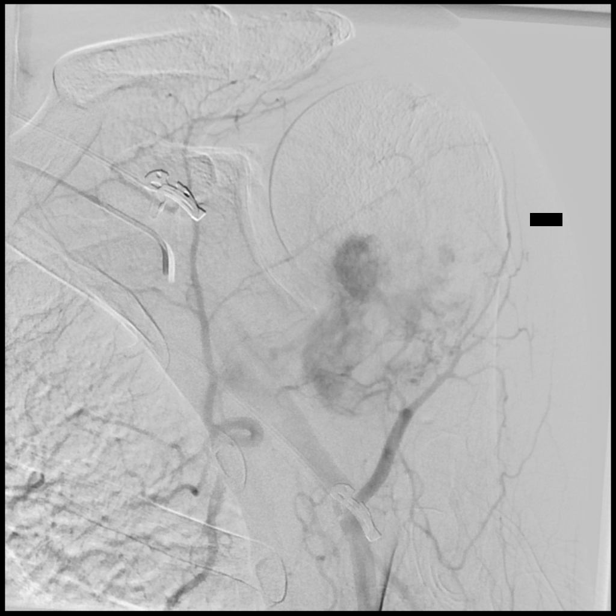

[13 of 24 positions shown; findings below may reference images not displayed]

EXAM:
IR ULTRASOUND GUIDANCE VASC ACCESS RIGHT; IR EMBO TUMOR ORGAN
[employer] GUIDE ROADMAPPING; LEFT EXTREMITY
ARTERIOGRAPHY; ADDITIONAL ARTERIOGRAPHY

MEDICATIONS:
1666 units heparin

ANESTHESIA/SEDATION:
Moderate (conscious) sedation was employed during this procedure. A
total of Versed 1.5 mg and Fentanyl 75 mcg was administered
intravenously.

Moderate Sedation Time: 59 minutes. The patient's level of
consciousness and vital signs were monitored continuously by
radiology nursing throughout the procedure under my direct
supervision.

CONTRAST:  88mL VISIPAQUE IODIXANOL 320 MG/ML IV SOLN

FLUOROSCOPY TIME:  Fluoroscopy Time: 13 minutes 30 seconds (112
mGy).

COMPLICATIONS:
None immediate.



The right common femoral artery was interrogated with ultrasound and
found to be widely patent. An image was obtained and stored for the
medical record. Local anesthesia was attained by infiltration with
1% lidocaine. A small dermatotomy was made. Under real-time
sonographic guidance, the vessel was punctured with a 21 gauge
micropuncture needle. Using standard technique, the initial micro
needle was exchanged over a 0.018 micro wire for a transitional 4
French micro sheath. The micro sheath was then exchanged over a
0.035 wire for a 5 French vascular sheath.

A 5 French vert catheter was advanced over a Bentson wire into the
aortic arch and used to select the left subclavian artery. The
catheter was advanced into the left axillary artery and
arteriography was performed in multiple obliquities. The pathologic
fracture of the proximal humerus is easily identified. There is
hypervascularity of the underlying tumor.

The dominant branch supplying the tumor was carefully selected using
a renegade [place]uet microcatheter over a Fathom 16 wire. Arteriography
confirms tumor blush. Particle embolization was then performed
utilizing 500-700 micron embospheres. Approximately [DATE] of a vial
were utilized. Post embolization arteriography demonstrates
significant decrease in tumor blush.

Additional arteriography was performed. There are 2 small wispy
branches arising from the axillary artery at acute angles providing
additional supply to the more medial aspect of the tumor. Attempts
to catheterize these 2 vessels were unsuccessful. More than 50% of
the tumor had been successfully embolized. Further attempts had to
be terminated due to an in-coming emergency.

The catheters were removed. Hemostasis was attained with the
assistance of a Celt closure device.
IMPRESSION: 1. Successful partial embolization of hypervascular metastasis to
the proximal left humerus. Approximately 50% or more of the tumor
was successfully embolized.
2. Additional small branches supplying the more medial aspect of the
tumor could not be accessed.

## 2023-02-06 IMAGING — RF DG C-ARM 1-60 MIN
1 series · 5 of 5 positions shown · non-contrast
Comparison: Radiograph 03/20/2021

CLINICAL DATA: Intraoperative, IM nail placement

EXAM:
LEFT HUMERUS - 2+ VIEW; DG C-ARM 1-60 MIN

[Series 1: unknown protocol · 0.20mm/px · 5 of 5 slices shown]
[im 1/5]
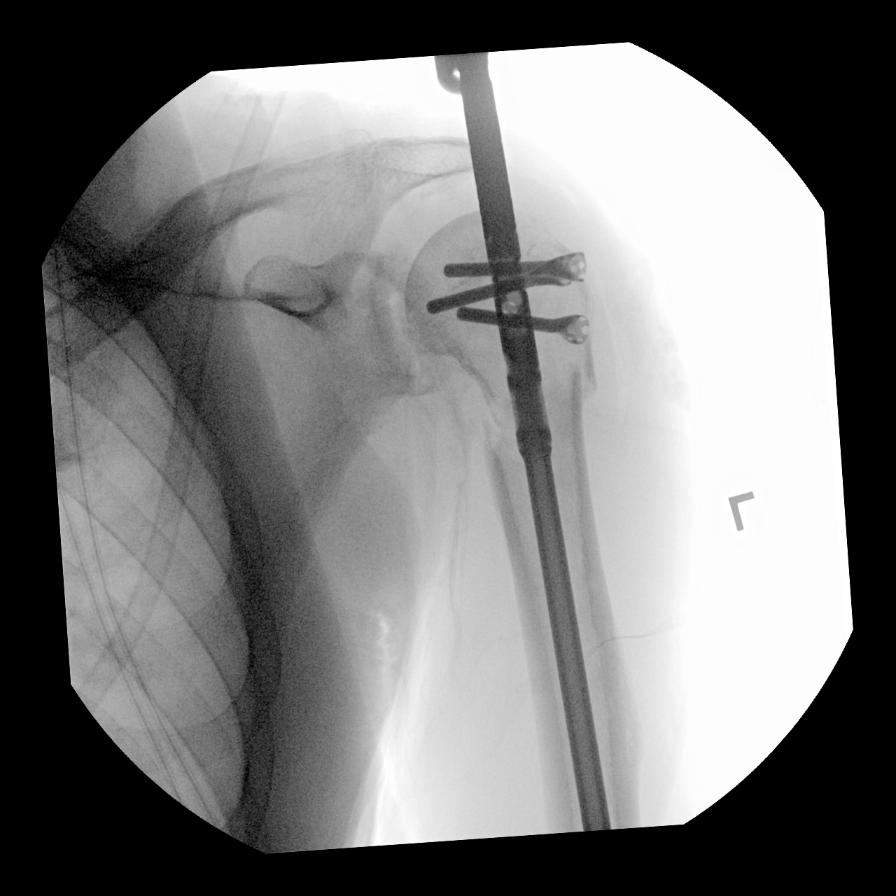
[im 2/5]
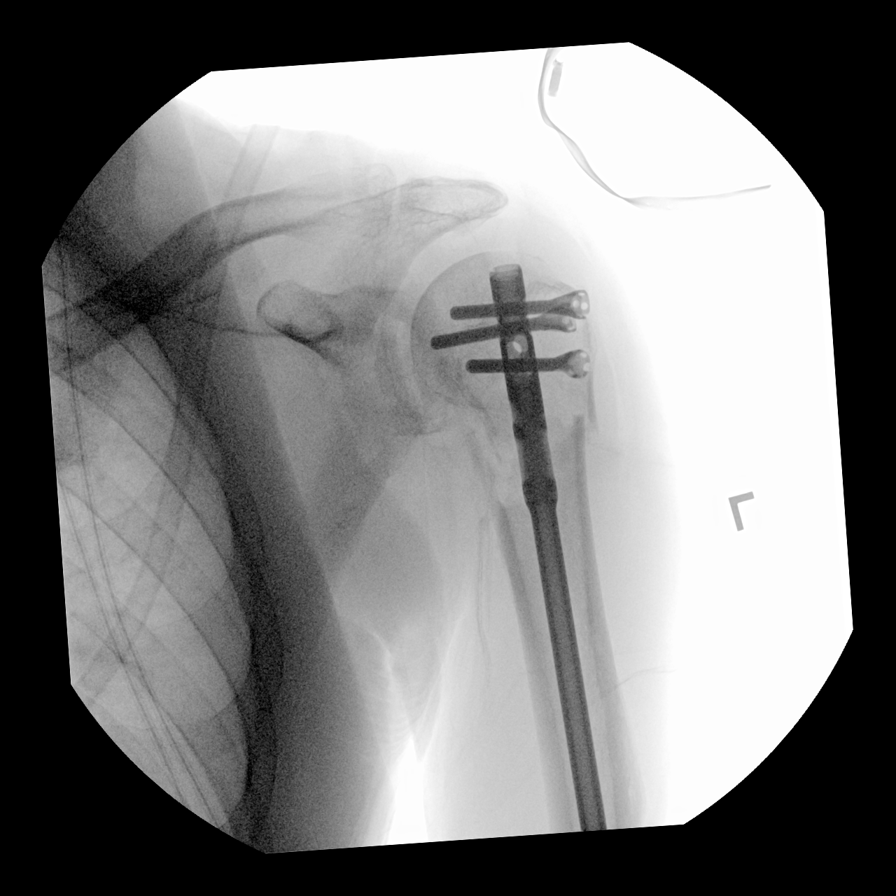
[im 3/5]
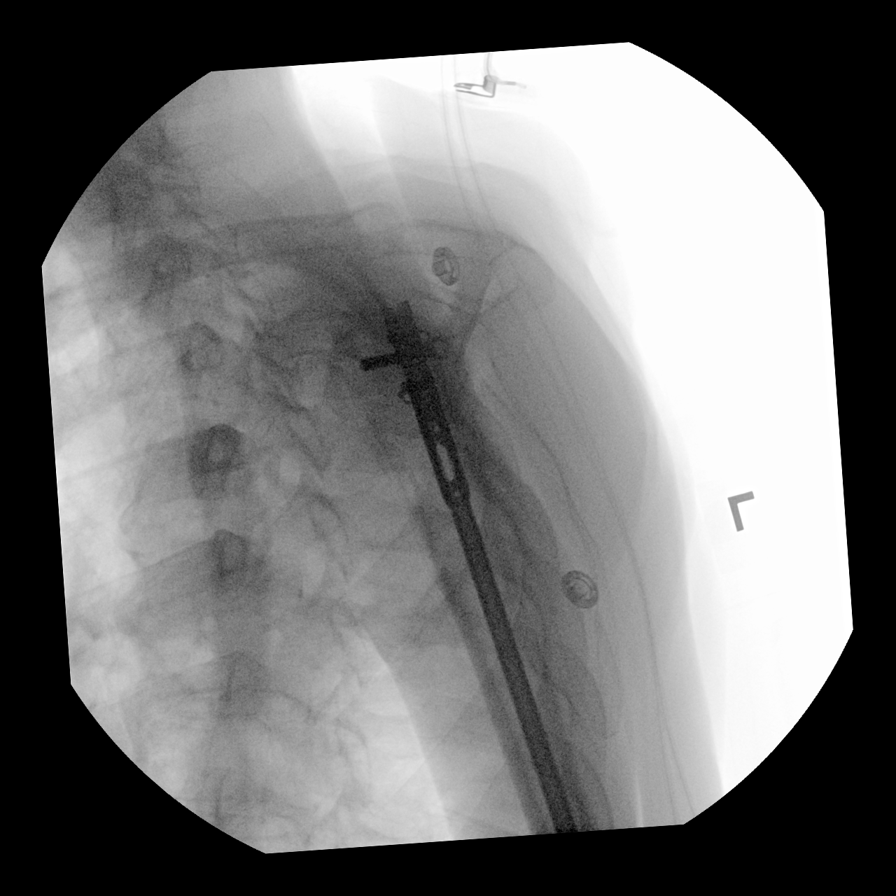
[im 4/5]
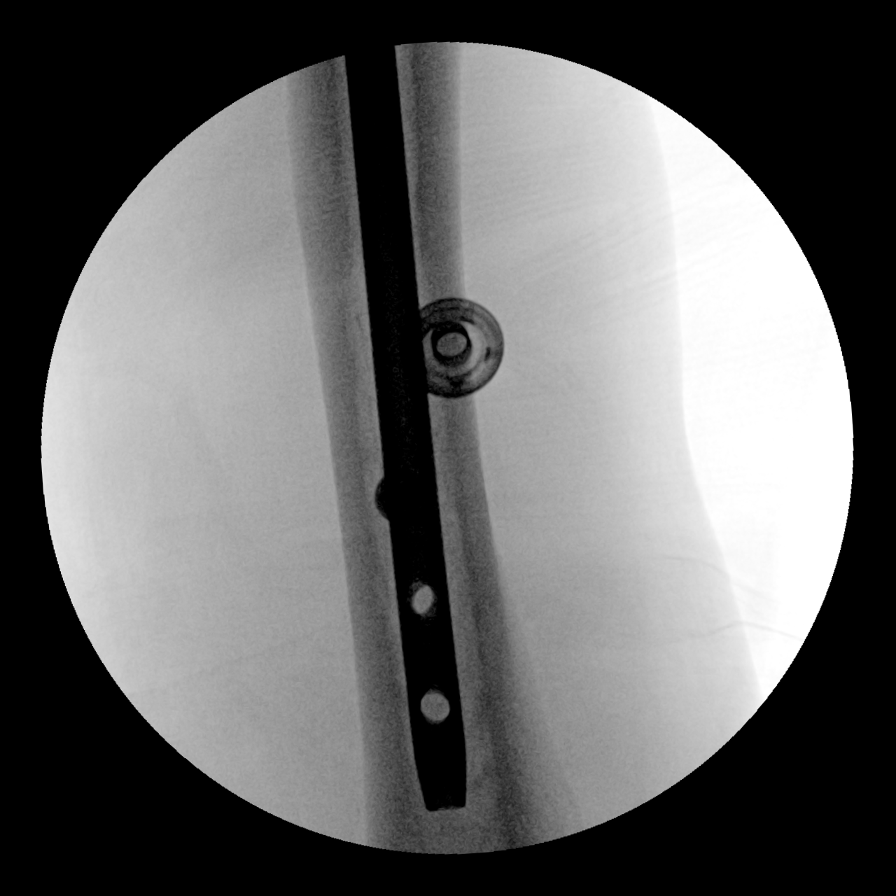
[im 5/5]
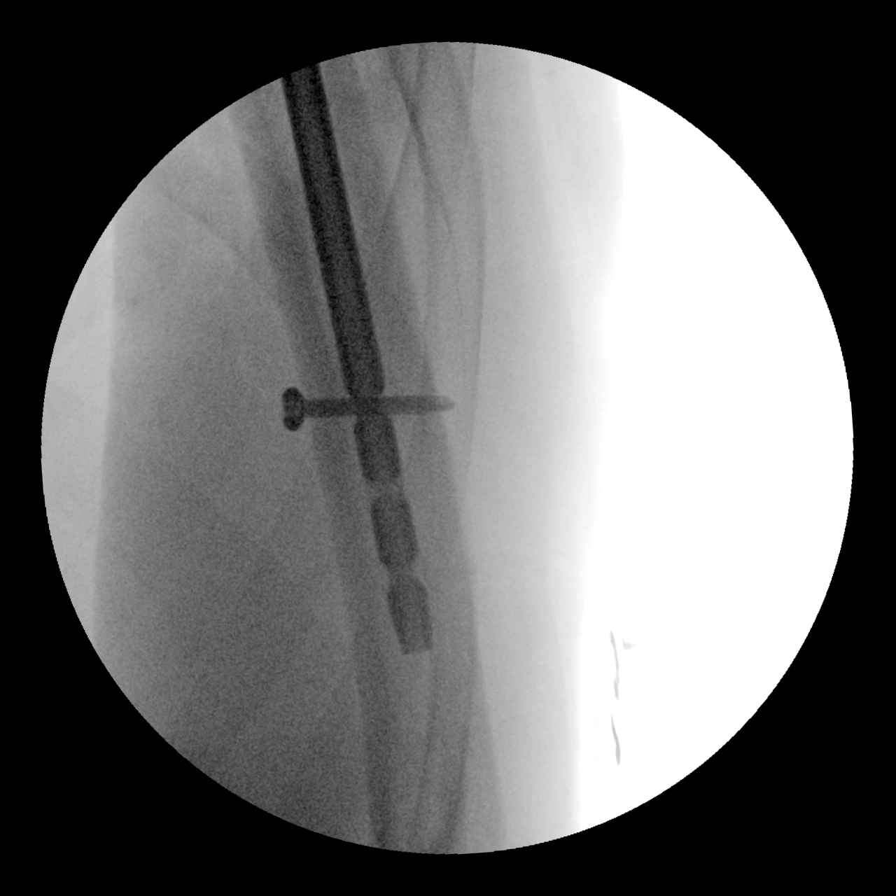

[5 of 5 positions shown; findings below may reference images not displayed]

FINDINGS: Fluoroscopy time 37 seconds, 2.22 mGy

Multiple intraoperative fluoroscopic views depict placement of an
intramedullary nail with fixation screws seen both proximally and
distally transfixing a comminuted pathologic fracture of the
proximal left humerus. No acute postsurgical complication is seen.
Additional degenerative changes noted about the shoulder.
IMPRESSION: Intraoperative fluoroscopic views demonstrating ORIF of a comminuted
pathologic fracture of the proximal left humerus. No acute
postsurgical complication is seen. See operative report for further
details.

## 2023-02-06 IMAGING — RF DG HUMERUS 2V *L*
1 series · 5 of 5 positions shown · non-contrast
Comparison: Radiograph 03/20/2021

CLINICAL DATA: Intraoperative, IM nail placement

EXAM:
LEFT HUMERUS - 2+ VIEW; DG C-ARM 1-60 MIN

[Series 1: unknown protocol · 0.20mm/px · 5 of 5 slices shown]
[im 1/5]
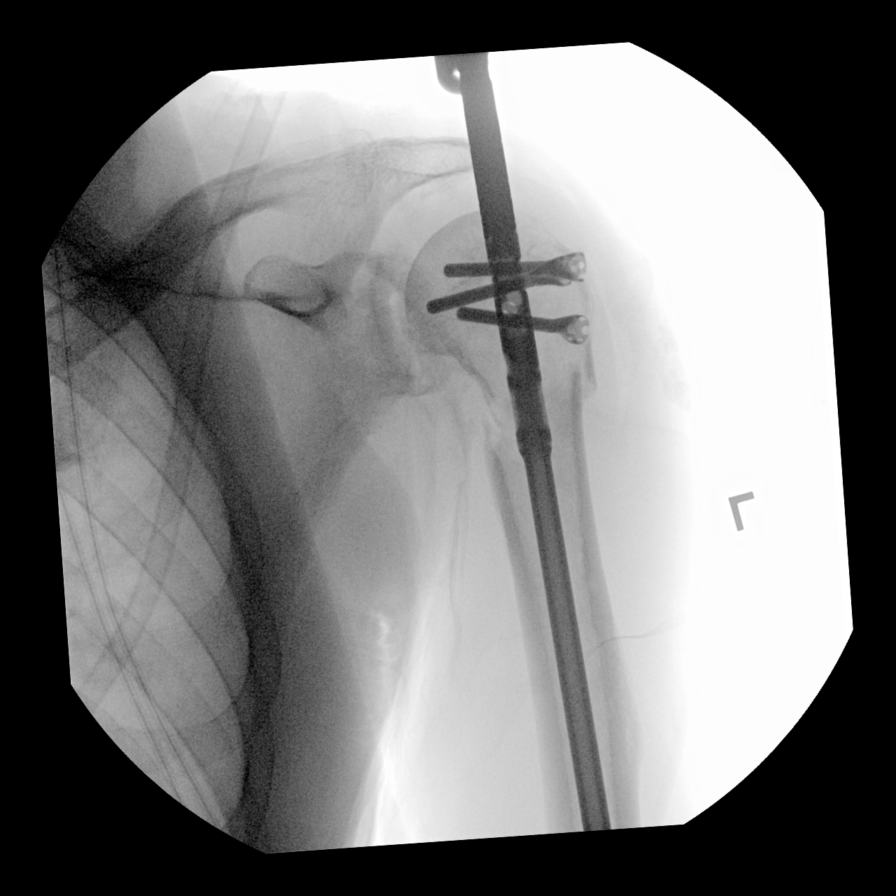
[im 2/5]
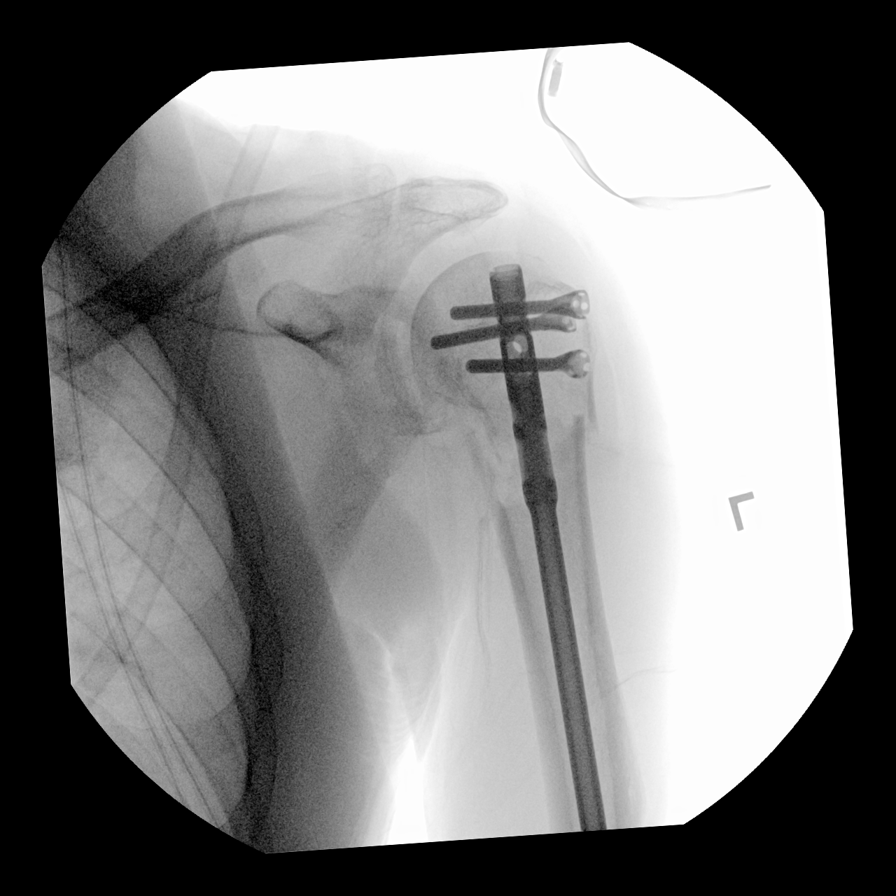
[im 3/5]
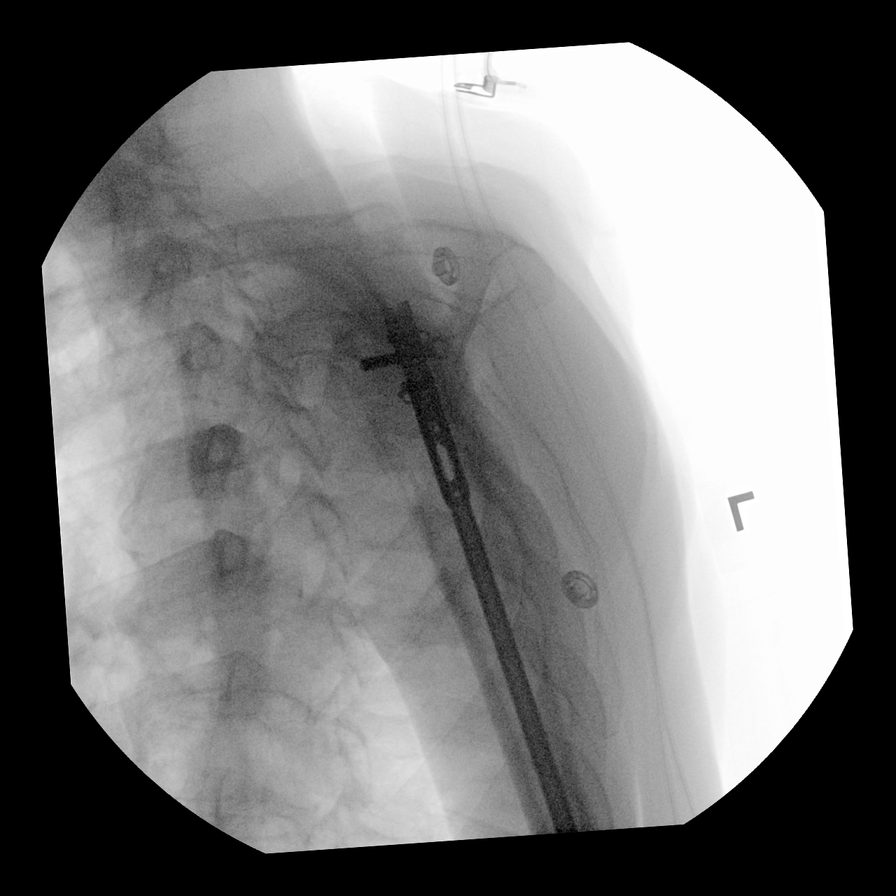
[im 4/5]
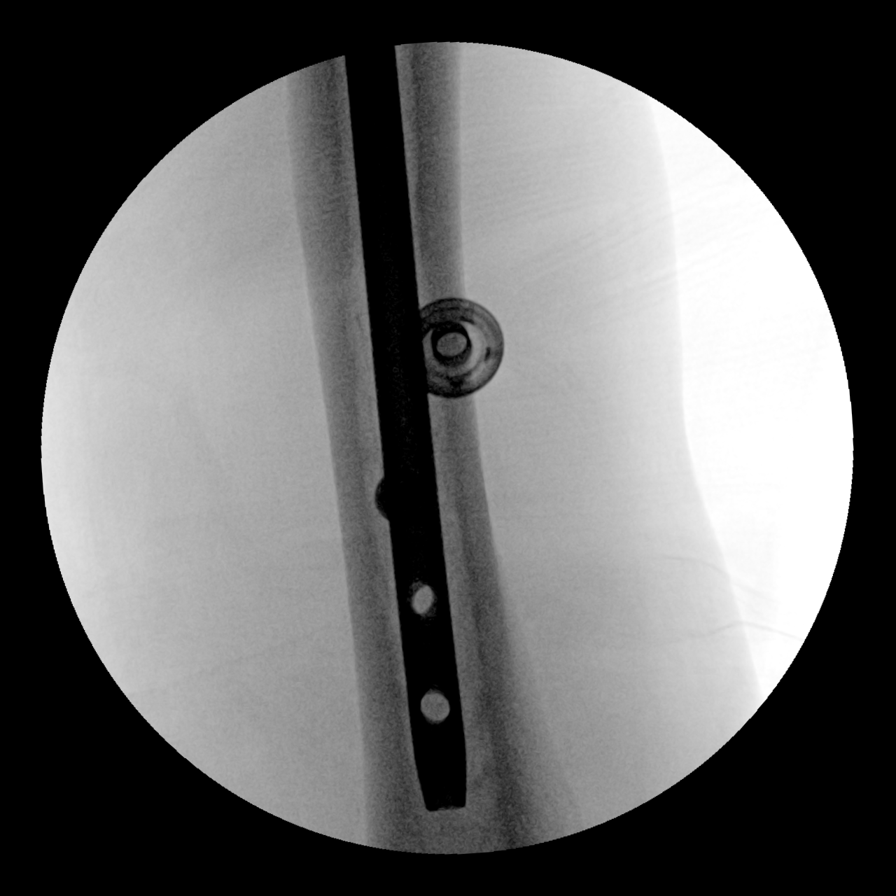
[im 5/5]
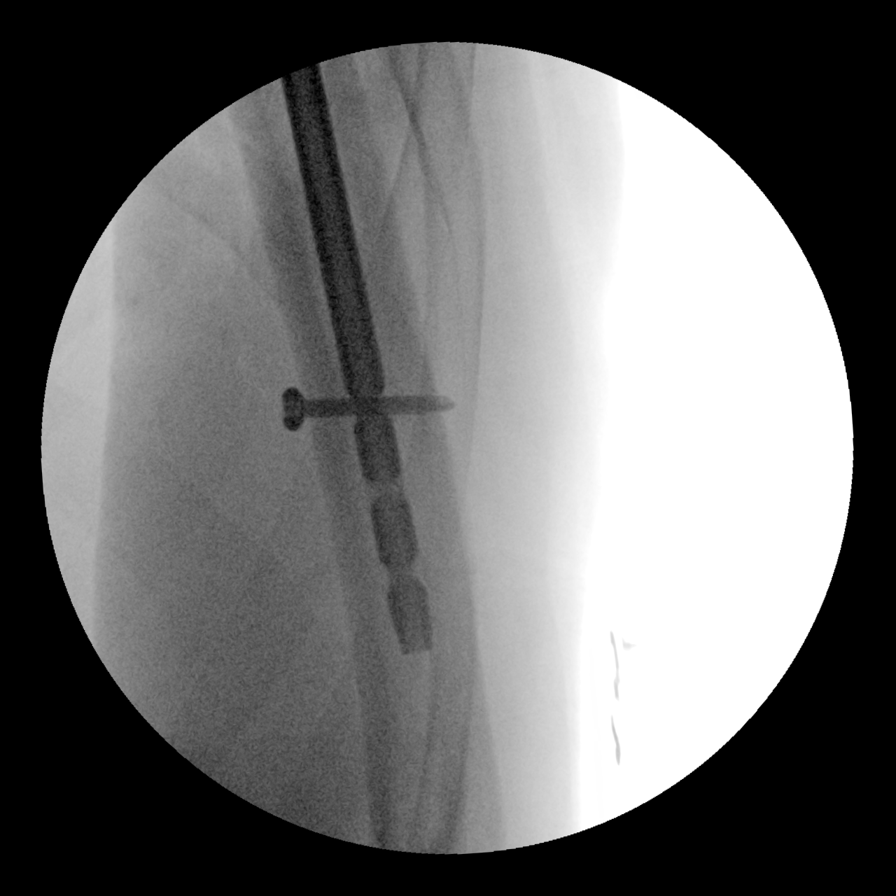

[5 of 5 positions shown; findings below may reference images not displayed]

FINDINGS: Fluoroscopy time 37 seconds, 2.22 mGy

Multiple intraoperative fluoroscopic views depict placement of an
intramedullary nail with fixation screws seen both proximally and
distally transfixing a comminuted pathologic fracture of the
proximal left humerus. No acute postsurgical complication is seen.
Additional degenerative changes noted about the shoulder.
IMPRESSION: Intraoperative fluoroscopic views demonstrating ORIF of a comminuted
pathologic fracture of the proximal left humerus. No acute
postsurgical complication is seen. See operative report for further
details.

## 2023-03-18 IMAGING — CT CT ABD-PELV W/O CM
2 of 4 series · 12 of 36 positions shown, 15 images · non-contrast
Comparison: 01/31/2021

CLINICAL DATA: Metastatic left renal cancer. Status post
nephrectomy. Restaging.

EXAM:
CT CHEST, ABDOMEN AND PELVIS WITHOUT CONTRAST
TECHNIQUE: Multidetector CT imaging of the chest, abdomen and pelvis was
performed following the standard protocol without IV contrast.

[Series 2: cap w/o · axial · non-contrast · 0.85mm/px · z∈[+909,+1529]mm · 9 of 148 slices shown, 12 images]
[im 12/148  mediastinal]
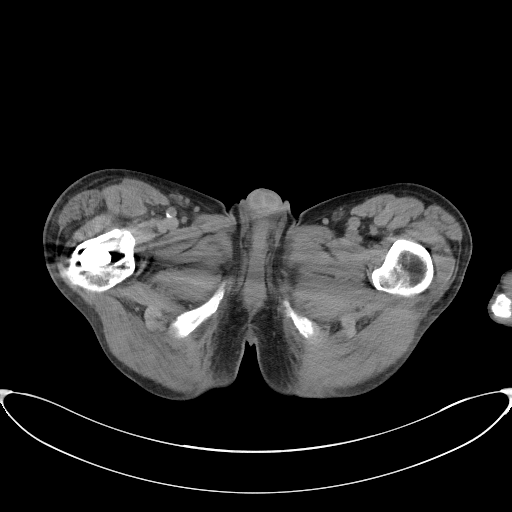
[im 12/148  lung]
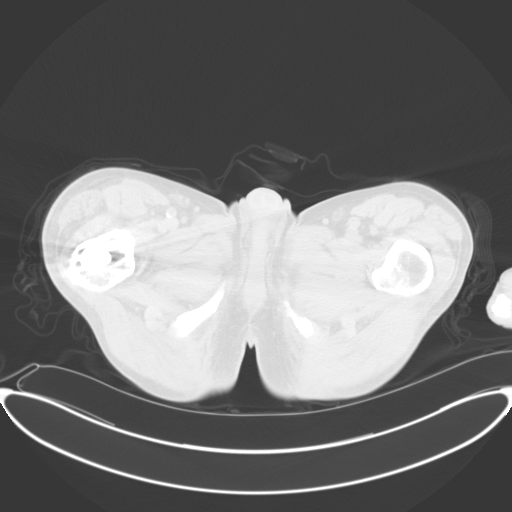
[im 34/148  lung]
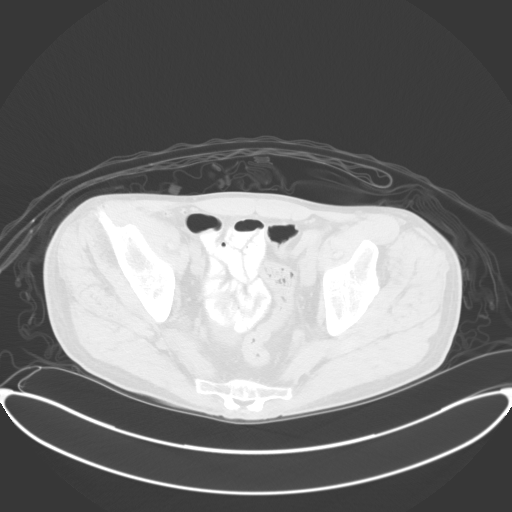
[im 46/148  lung]
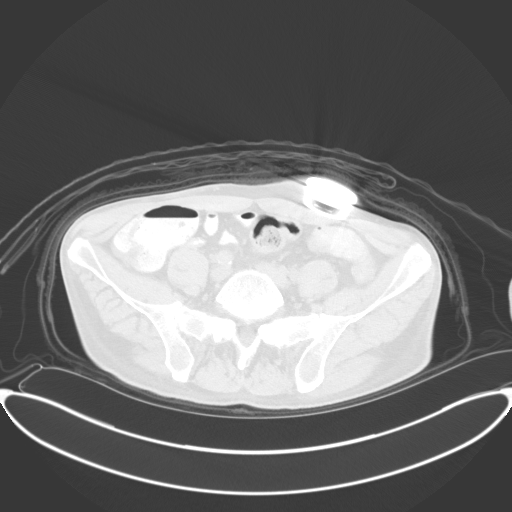
[im 57/148  lung]
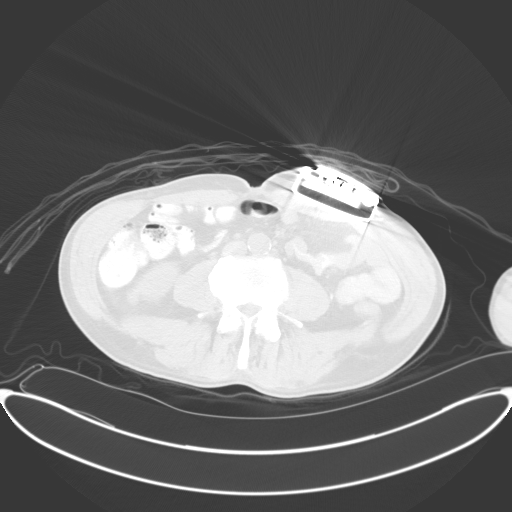
[im 80/148  mediastinal]
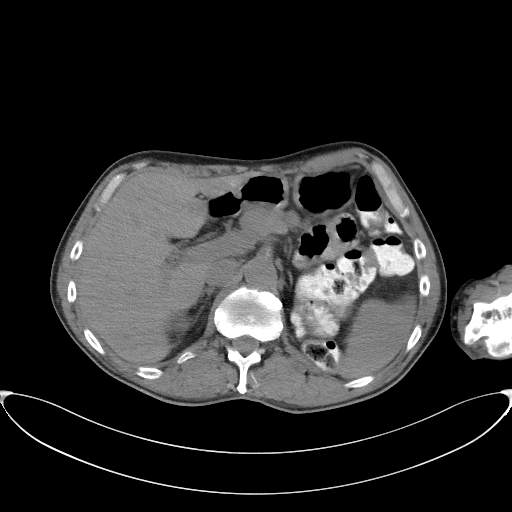
[im 80/148  lung]
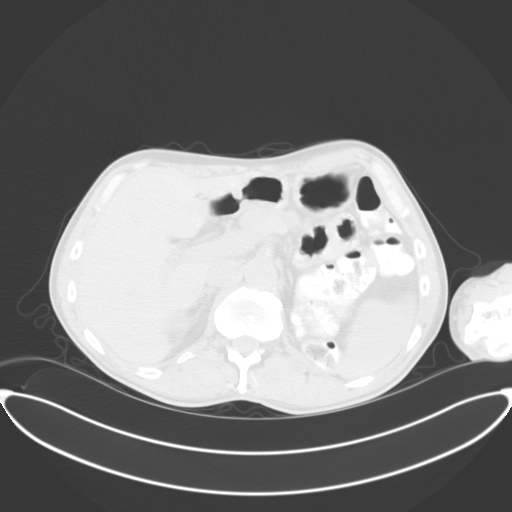
[im 91/148  lung]
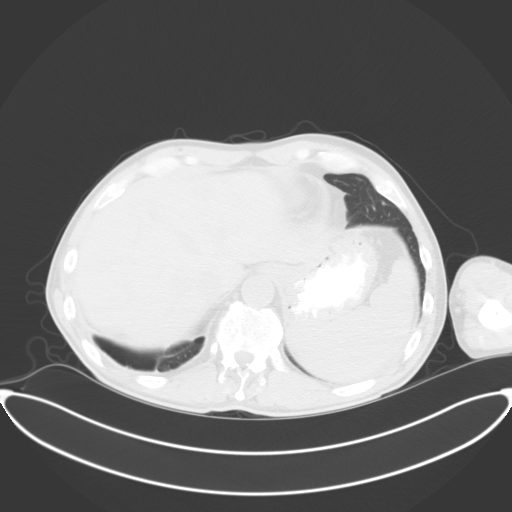
[im 102/148  lung]
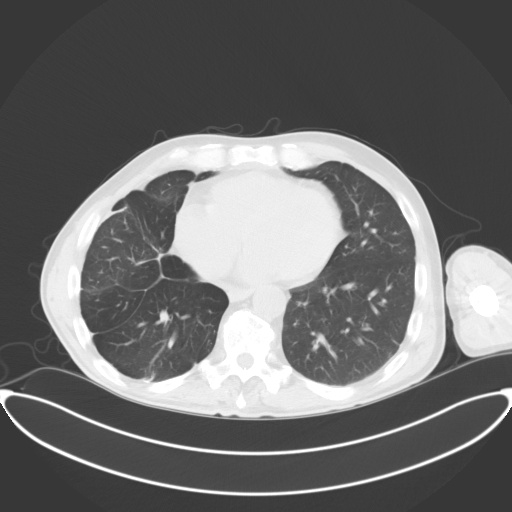
[im 125/148  lung]
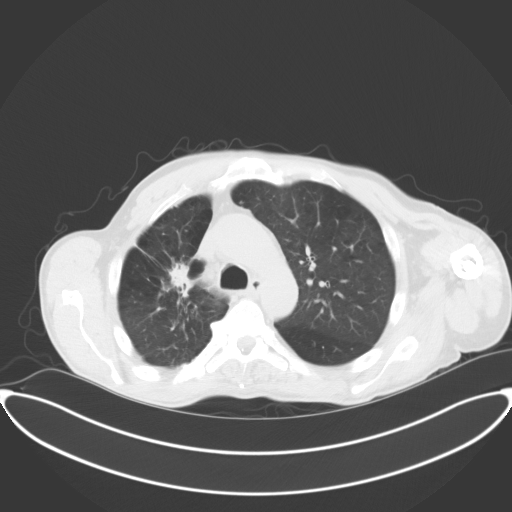
[im 136/148  mediastinal]
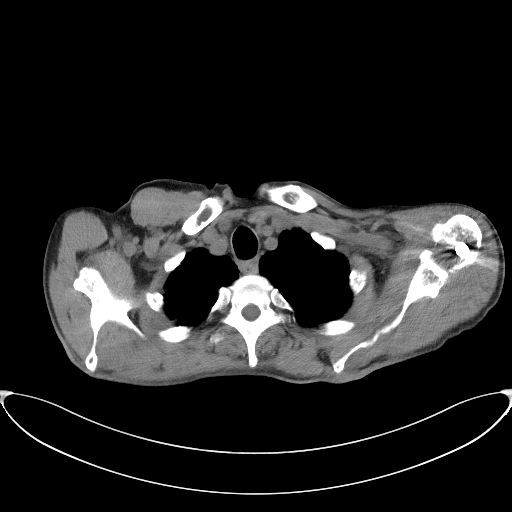
[im 136/148  lung]
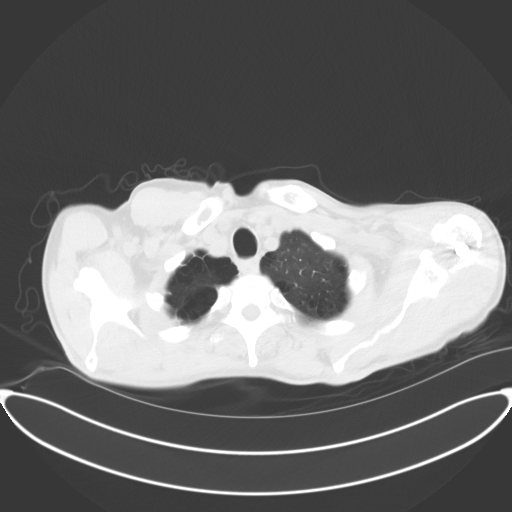

[Series 4: coronals · coronal · 0.79mm/px · 3 of 131 slices shown]
[im 27/131  lung]
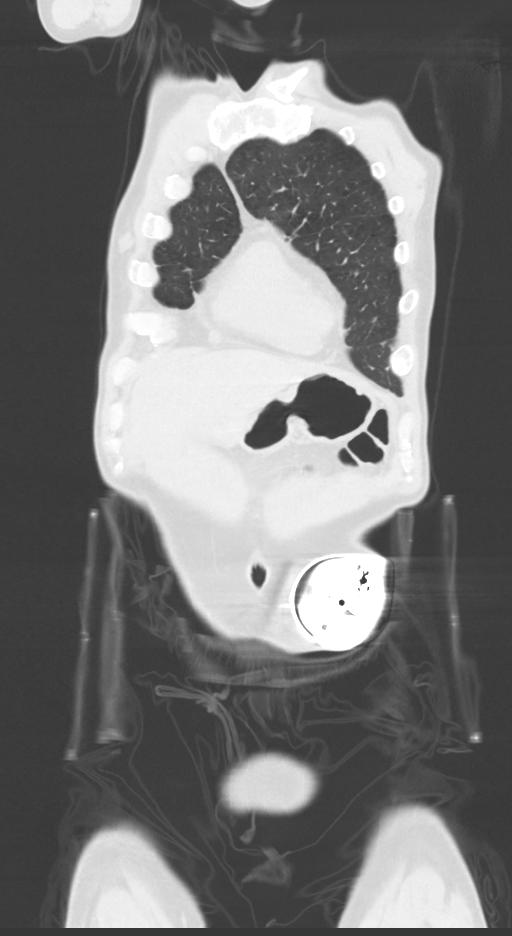
[im 53/131  lung]
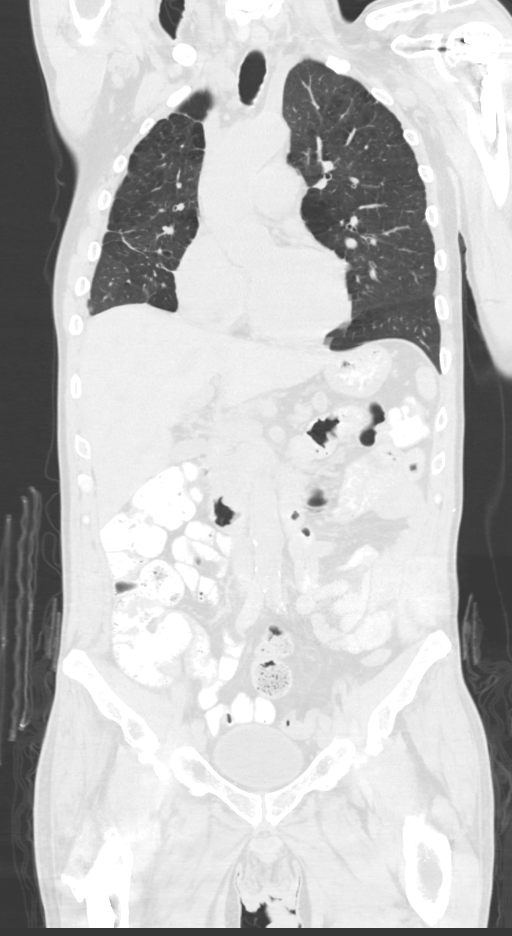
[im 79/131  lung]
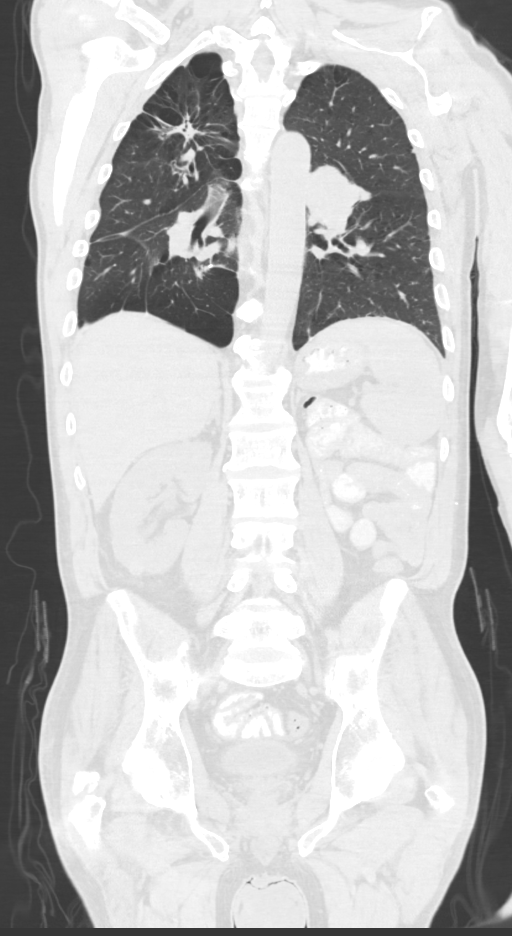

[12 of 36 positions shown; findings below may reference images not displayed]

FINDINGS: CT CHEST FINDINGS

Cardiovascular: The heart size is normal. No substantial pericardial
effusion. Coronary artery calcification is evident. Ascending
thoracic aorta measures 4.3 cm diameter.

Mediastinum/Nodes: No paratracheal lymphadenopathy. 11 mm short axis
juxta cardiac node on 55/2 was 6 mm previously. Similar appearance
left hilar lymphadenopathy. The esophagus has normal imaging
features. There is no axillary lymphadenopathy.

Lungs/Pleura: Centrilobular and paraseptal emphysema evident.
Scarring in the suprahilar central right upper lobe is similar in
the interval. Irregular central right middle lobe nodule measured
previously at 2.4 cm is similar at 2.4 cm today (98/6). Staple line
noted right lower lobe. Tree-in-bud nodularity identified in the
left lower lobe suggests atypical infection.

Musculoskeletal: Fixation hardware noted proximal left humerus.

CT ABDOMEN PELVIS FINDINGS

Hepatobiliary: 2.3 cm low-density lesion in the anterior dome of
liver (55/2) is new in the interval, compatible with metastatic
disease. Possible second new 10 mm lesion inferior right liver on
74/2. There is no evidence for gallstones, gallbladder wall
thickening, or pericholecystic fluid. No intrahepatic or
extrahepatic biliary dilation.

Pancreas: Diffuse dilatation of the main pancreatic duct noted in
the tail of pancreas with an abrupt cut off in the pancreatic body.
Soft tissue fullness in this region is similar to prior study
although a soft tissue nodule anterior to the body of pancreas
measured previously at 3.1 x 2.3 cm is now 3.7 x 3.2 cm.

Spleen: No splenomegaly. No focal mass lesion.

Adrenals/Urinary Tract: No adrenal nodule or mass. Mild fullness
noted right intrarenal collecting system without associated right
hydroureter. Bladder unremarkable. Left kidney surgically absent.

Stomach/Bowel: Stomach is unremarkable. No gastric wall thickening.
No evidence of outlet obstruction. Duodenum is normally positioned
as is the ligament of Treitz. No small bowel wall thickening. No
small bowel dilatation. The terminal ileum is normal. The appendix
is normal. No gross colonic mass. No colonic wall thickening.

Vascular/Lymphatic: There is abdominal aortic atherosclerosis
without aneurysm. There is no gastrohepatic or hepatoduodenal
ligament lymphadenopathy. No retroperitoneal or mesenteric
lymphadenopathy. No pelvic sidewall lymphadenopathy.

Reproductive: The prostate gland and seminal vesicles are
unremarkable.

Other: Small volume free fluid noted in the pelvis.

Musculoskeletal: Bones are diffusely demineralized. Stable right
sacral metastatic lesion. Fixation hardware noted right femoral neck
2 cm lucent lesion in the inferior right pubic ramus is stable in
the interval.
IMPRESSION: 1. Interval development of 1, possibly 2 low-density lesions in the
liver, compatible with metastatic disease. Possible second
2. Interval progression of a soft tissue nodule anterior to the body
of pancreas with similar soft tissue fullness in the pancreatic body
obstructing the main pancreatic duct.
3. No substantial change in bilateral irregular pulmonary nodules.
4. Tree-in-bud nodularity in the left lower lobe suggests atypical
infection.
5. Small volume free fluid in the pelvis.
6. Stable appearance of bony metastatic involvement.
7. Aortic Atherosclerosis ([id number].D) and Emphysema ([id number].F).

## 2023-06-24 IMAGING — CT CT ABD-PEL WO/W CM
3 of 13 series · 10 of 46 positions shown, 16 images · IV contrast (omnipaque)
Comparison: 05/14/2021 and 01/31/2021

CLINICAL DATA: Left nephrectomy 14 years ago for renal cell
carcinoma. Right lung resection. Pancreatic cancer diagnosed 10
years ago with oral chemotherapy ongoing.

EXAM:
CT ABDOMEN AND PELVIS WITHOUT AND WITH CONTRAST
TECHNIQUE: Multidetector CT imaging of the abdomen and pelvis was performed
following the standard protocol before and following the bolus
administration of intravenous contrast.
CONTRAST:  80mL OMNIPAQUE IOHEXOL 350 MG/ML SOLN

[Series 2: axial pre · axial · non-contrast · 0.81mm/px · z∈[+1090,+1429]mm · 6 of 159 slices shown, 11 images]
[im 23/159  soft-tissue]
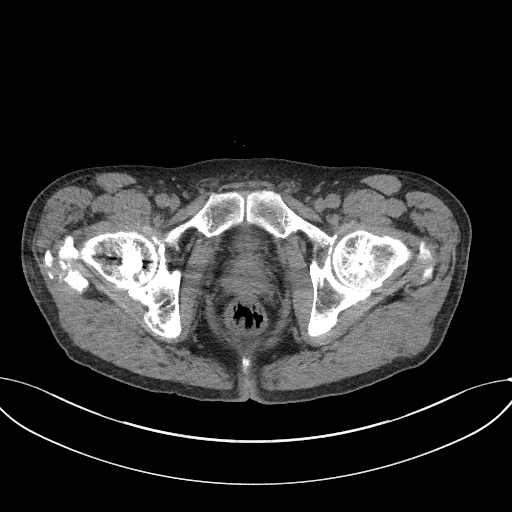
[im 23/159  bone]
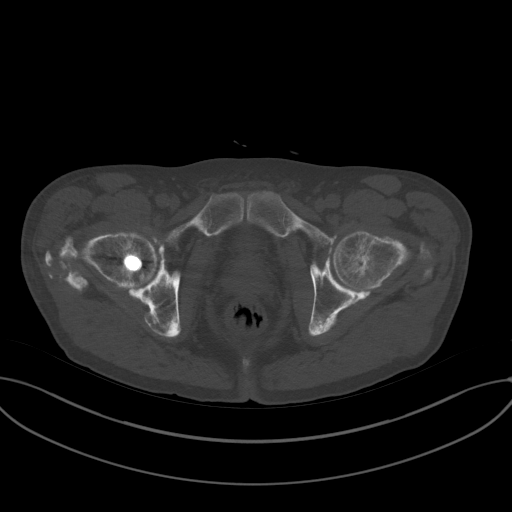
[im 46/159  soft-tissue]
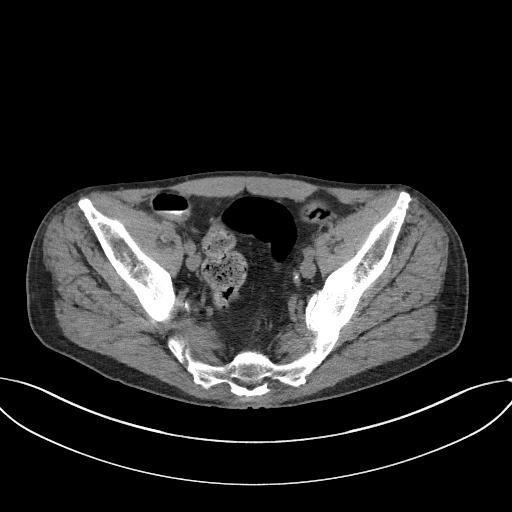
[im 68/159  soft-tissue]
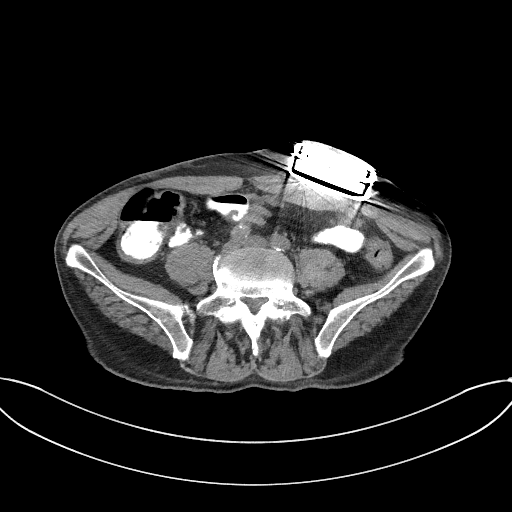
[im 68/159  lung]
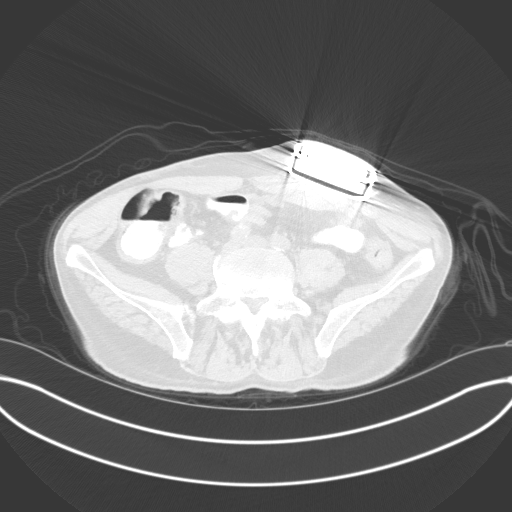
[im 91/159  soft-tissue]
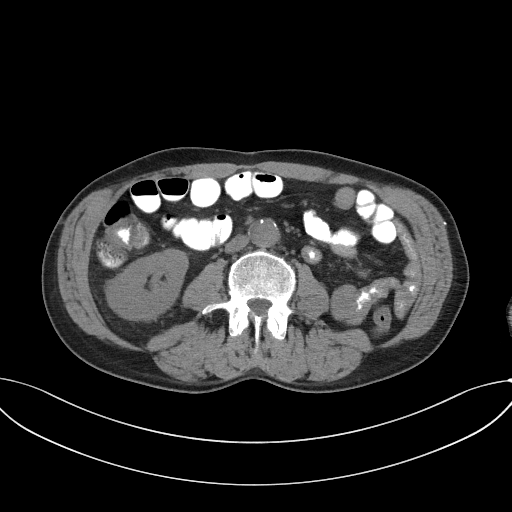
[im 91/159  lung]
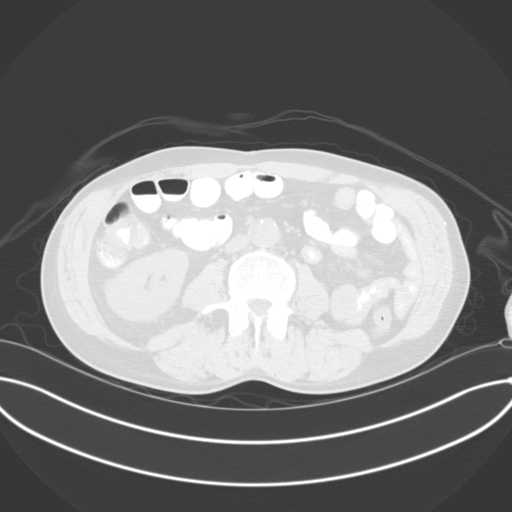
[im 113/159  soft-tissue]
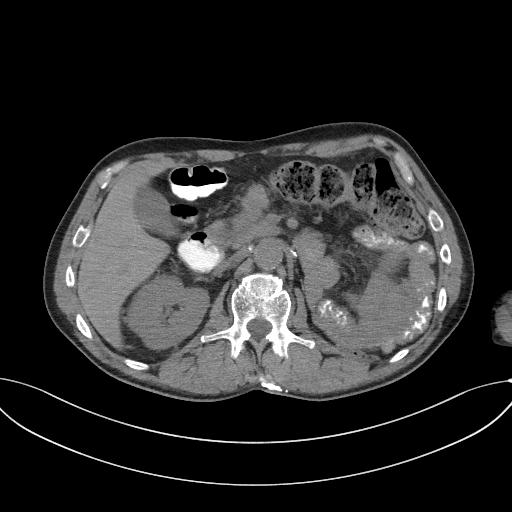
[im 113/159  lung]
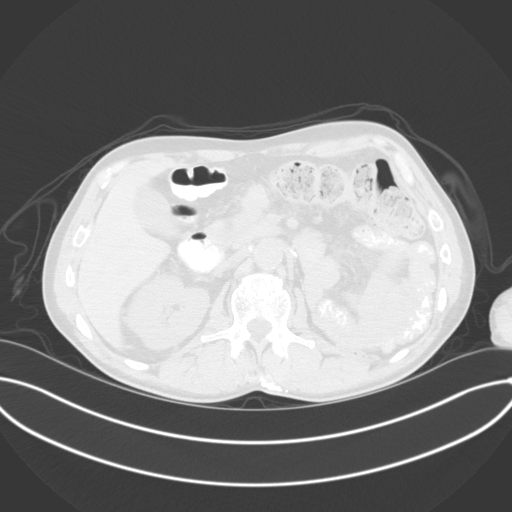
[im 136/159  soft-tissue]
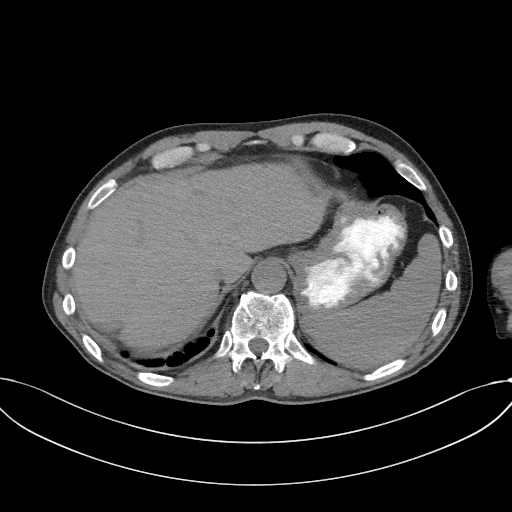
[im 136/159  lung]
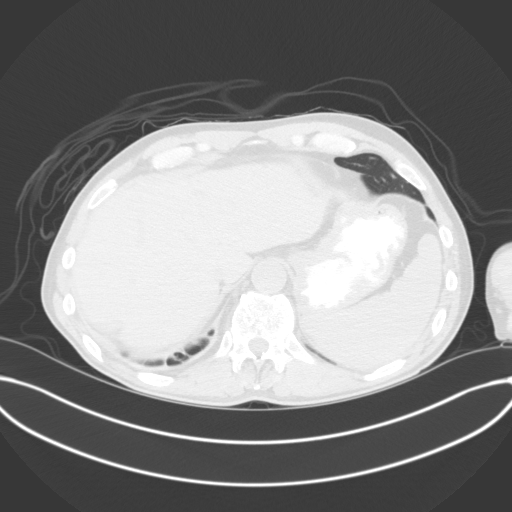

[Series 11: axial nephro · axial · 0.81mm/px · z∈[+1100,+1178]mm · 2 of 158 slices shown]
[im 27/158  soft-tissue]
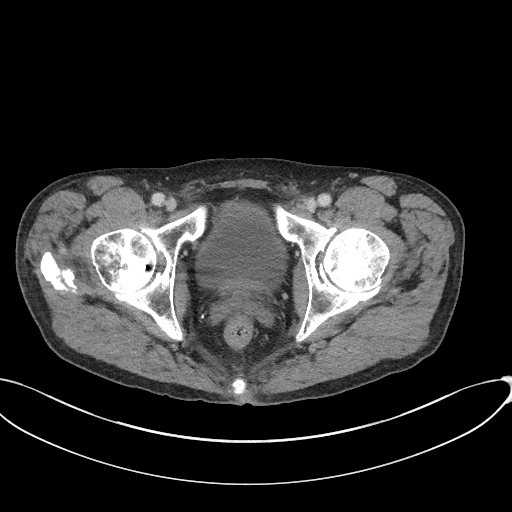
[im 53/158  soft-tissue]
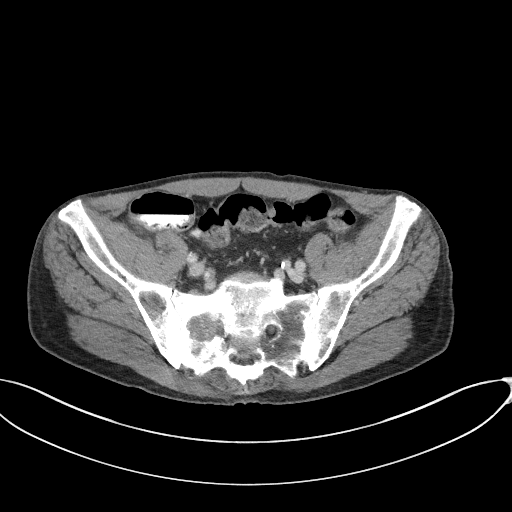

[Series 14: coronal venous · coronal · portal-venous · 0.89mm/px · 2 of 91 slices shown, 3 images]
[im 31/91  soft-tissue]
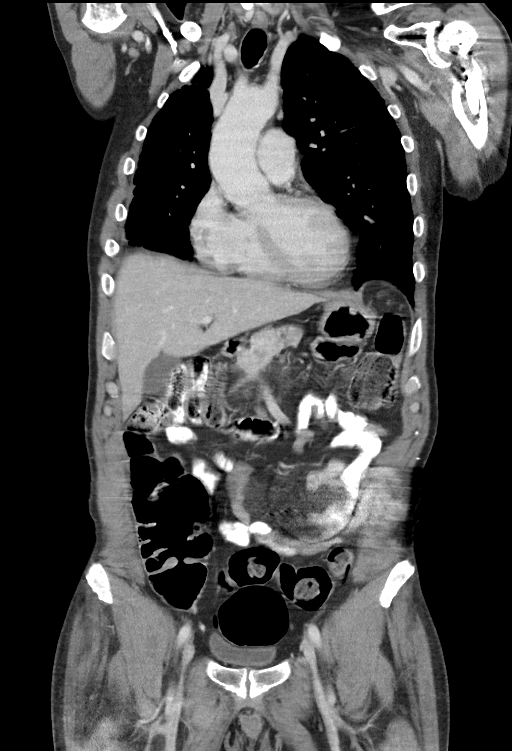
[im 31/91  bone]
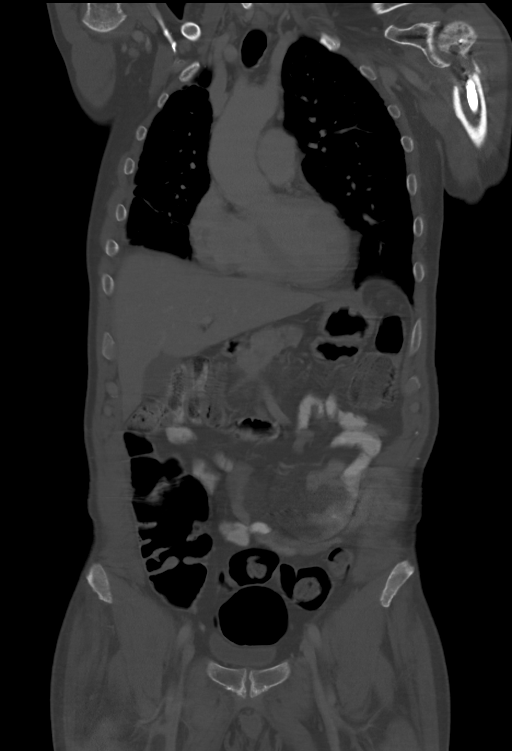
[im 61/91  soft-tissue]
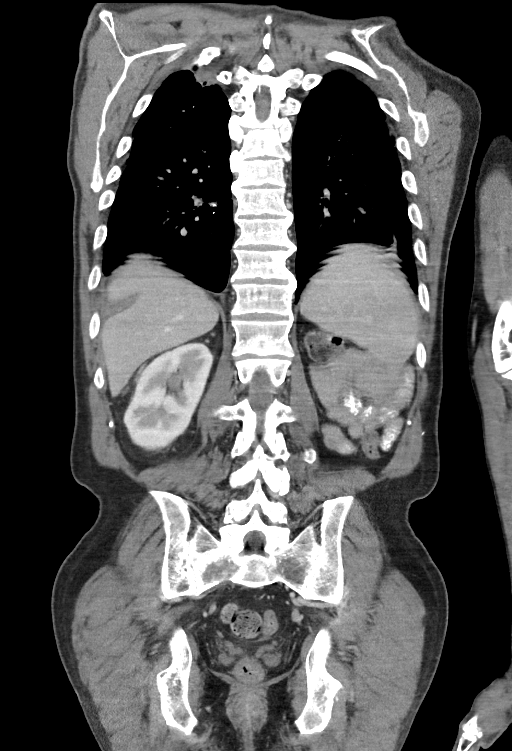

[10 of 46 positions shown; findings below may reference images not displayed]

FINDINGS: Lower chest: Surgical sutures in the right lung base. Bibasilar
scarring.

Normal heart size without pericardial or pleural effusion. Avidly
enhancing and therefore suspicious right cardiophrenic angle node
measures 8 mm on [DATE] and is similar to on the prior exam (when
remeasured).

Hepatobiliary: Innumerable arterially enhancing foci throughout the
liver. Although recent exams are either without contrast or without
arterial phase imaging, when compared back to exam of 06/19/2020,
these lesions are felt to be increased in number and size. Example
peripherally enhancing right hepatic lobe 9 mm lesion on [DATE], new.

High right hepatic lobe arterially hyperenhancing lesion of 7 mm on
[DATE] is not readily apparent on the prior. None of these findings
persist on portal venous phase imaging.

Normal gallbladder, without biliary ductal dilatation.

Pancreas: Lesion either exophytic off or adjacent to the ventral
pancreatic neck measures 2.9 x 2.2 cm on 46/6 versus 3.7 x 3.2 cm on
the prior exam.

Just posterior and superior to this, a pancreatic neck lesion
measures 2.1 x 1.8 cm on 40/6, causing pancreatic duct obstruction.
Compare 3.6 x 2.0 cm on 01/31/2021. Not well delineated on the more
recent noncontrast exam.

An enhancing node or nodule about the ventral pancreatic head on
48/6 measures 8 mm and was similar on 06/19/2020.

No superimposed acute pancreatitis.

Spleen: Normal in size, without focal abnormality.

Adrenals/Urinary Tract: Mild adrenal nodularity and thickening are
unchanged. Nephrectomy, without local recurrence. No suspicious
right renal mass or hydronephrosis. Normal urinary bladder.

Stomach/Bowel: Normal stomach, without wall thickening. Colonic
stool burden suggests constipation. Normal small bowel.

Vascular/Lymphatic: Aortic atherosclerosis. No abdominopelvic
adenopathy.

Reproductive: Normal prostate.

Other: No significant free fluid. No evidence of omental or
peritoneal disease. Portal venous phase abdominopelvic images are
minimally motion degraded.

Musculoskeletal: Mild left-sided gynecomastia. Right proximal femur
fixation. Osteopenia. Right sacral lytic lesion of 3.0 cm may be
slightly increased compared to the prior exam.Right inferior pubic
ramus lytic lesion measures 1.9 cm today versus 2.0 cm on the prior.
IMPRESSION: 1. Somewhat challenging direct comparison to prior exams, given lack
of arterial phase imaging back to 06/19/2020.
2. Given this limitation, pancreatic and peripancreatic disease is
felt to be slightly decreased, with persistent pancreatic duct
obstruction.
3. Multiple hypervascular foci within the liver, suspicious for
hepatic metastasis. These are felt to be slightly increased in
number since 06/19/2020.
[DATE]. Similar right cardiophrenic angle adenopathy, suspicious based on
location and the extent of enhancement.
5. Osseous metastasis, felt to be similar.
6. Left nephrectomy, without local recurrence.
7.  Possible constipation.
8.  Aortic Atherosclerosis ([id number]).

## 2023-06-24 IMAGING — CT CT CHEST W/ CM
2 of 4 series · 14 of 46 positions shown, 16 images · IV contrast (omnipaque)
Comparison: 05/14/2021.

CLINICAL DATA: Urologic cancer, left nephrectomy for left renal
cell carcinoma. Pancreatic cancer.

EXAM:
CT CHEST WITH CONTRAST
TECHNIQUE: Multidetector CT imaging of the chest was performed during
intravenous contrast administration.
CONTRAST:  80mL OMNIPAQUE IOHEXOL 350 MG/ML SOLN

[Series 1: axial nephro · axial · 0.81mm/px · z∈[+1378,+1660]mm · 11 of 114 slices shown, 13 images (1 of 2)]
[im 10/114  soft-tissue]
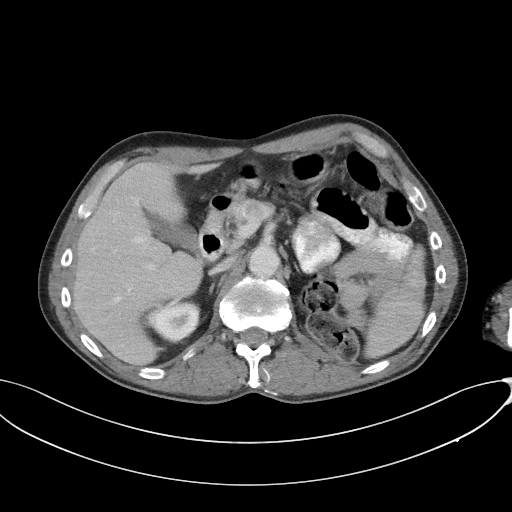
[im 10/114  bone]
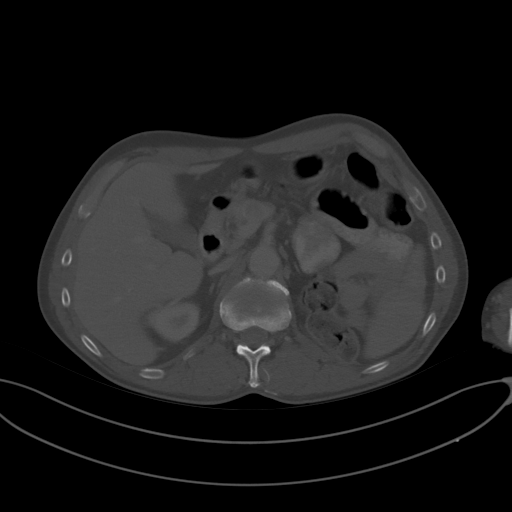
[im 19/114  soft-tissue]
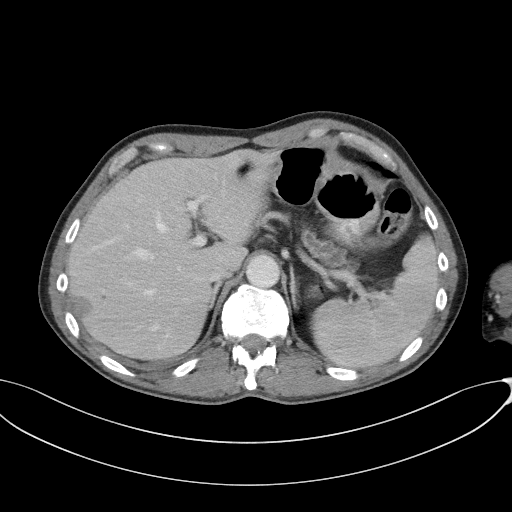
[im 29/114  soft-tissue]
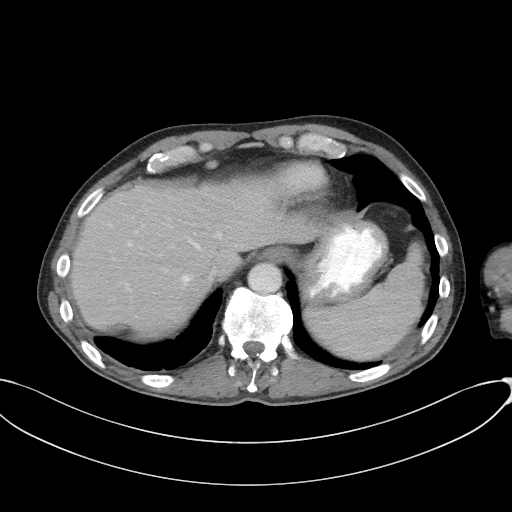
[im 38/114  soft-tissue]
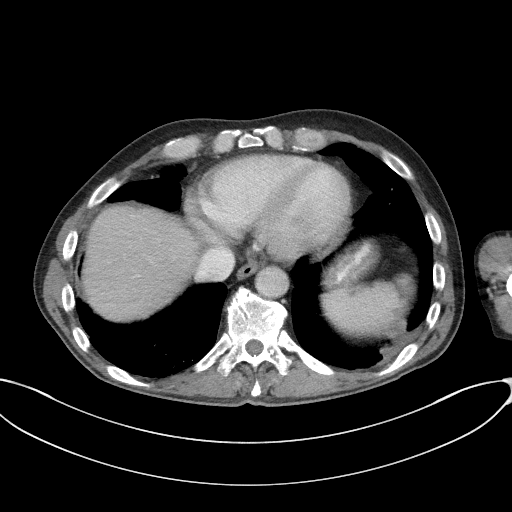
[im 48/114  soft-tissue]
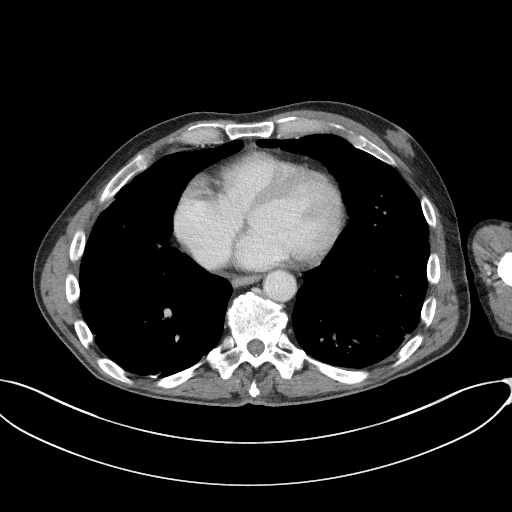
[im 57/114  soft-tissue]
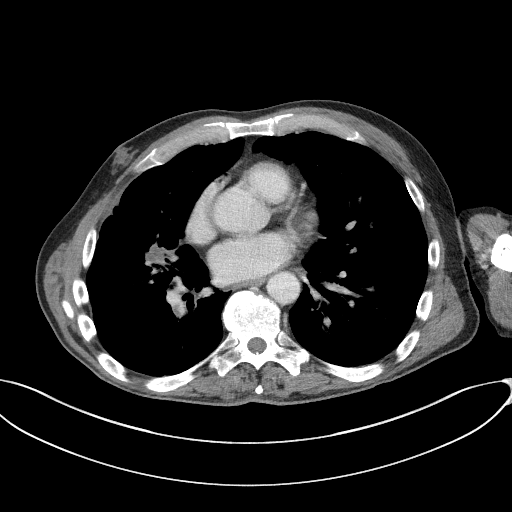
[im 66/114  soft-tissue]
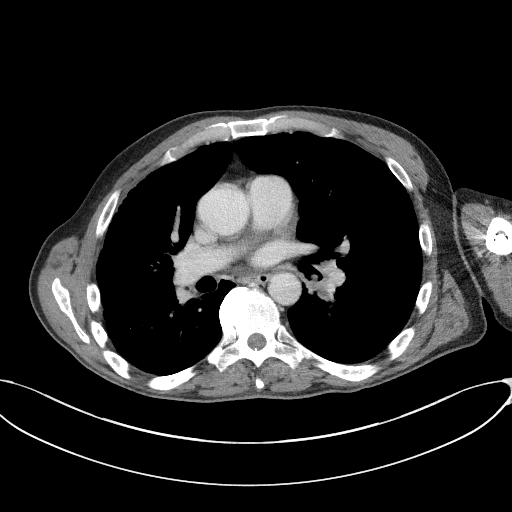
[im 76/114  soft-tissue]
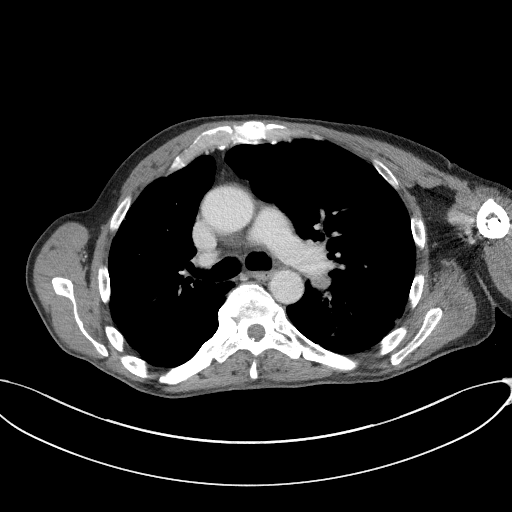
[im 85/114  soft-tissue]
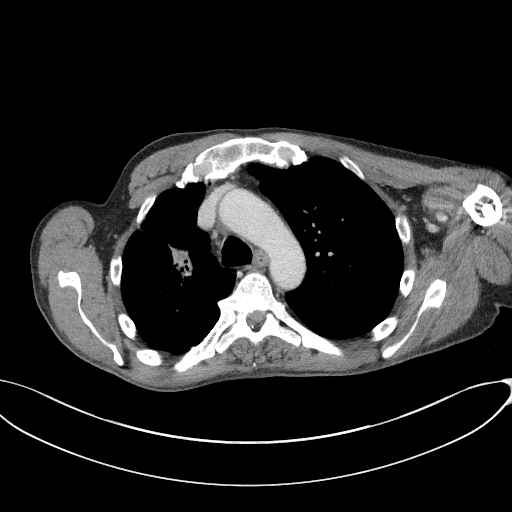
[im 85/114  bone]
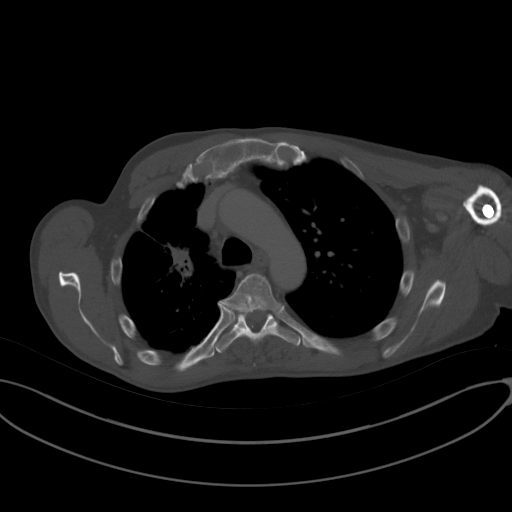
[im 95/114  soft-tissue]
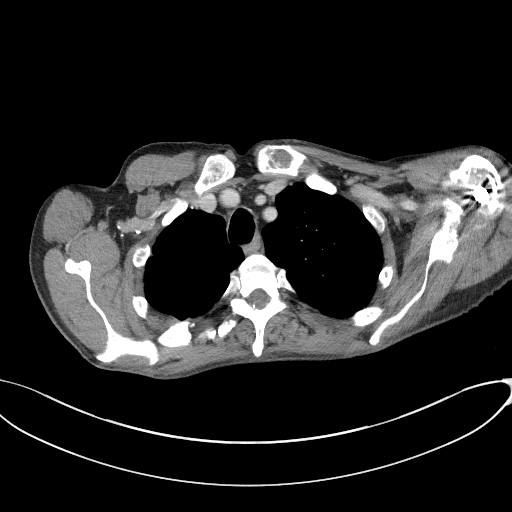
[im 104/114  soft-tissue]
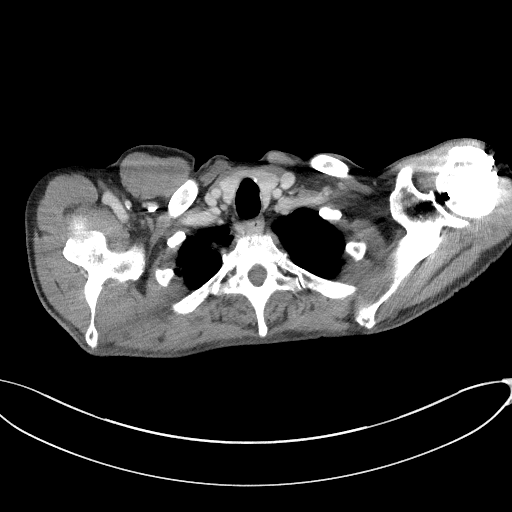

[Series 4: axial nephro · coronal · 0.71mm/px · 3 of 85 slices shown (2 of 2)]
[im 29/85  soft-tissue]
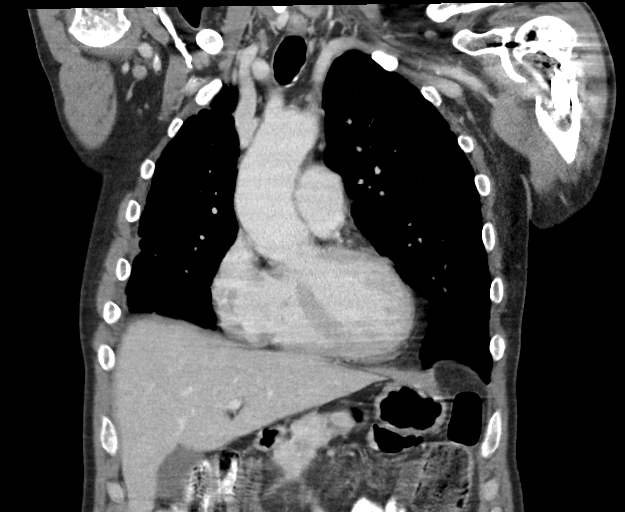
[im 38/85  soft-tissue]
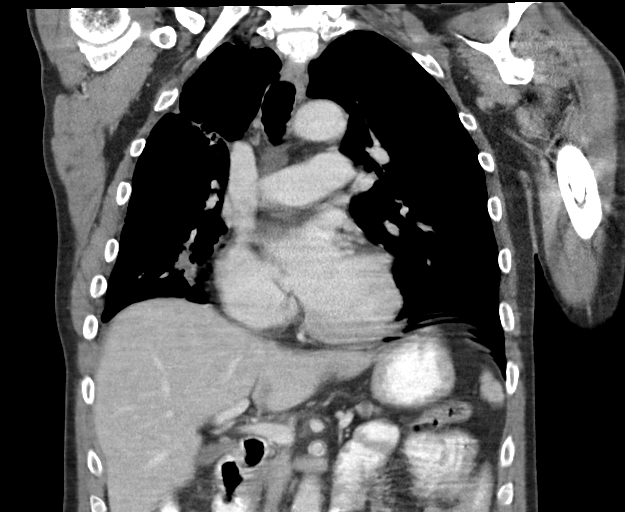
[im 47/85  soft-tissue]
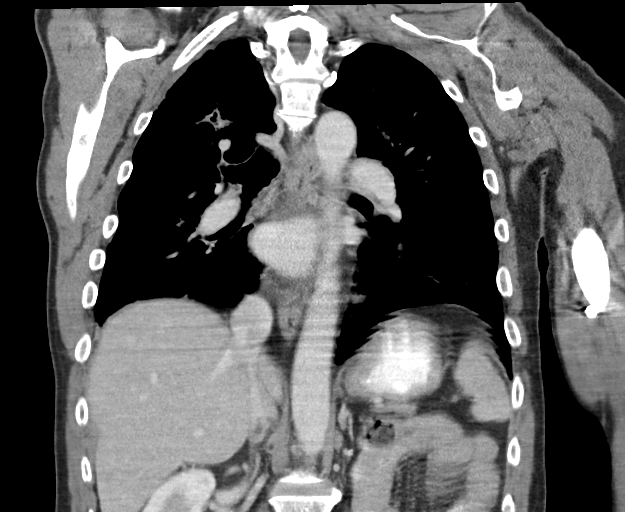

[14 of 46 positions shown; findings below may reference images not displayed]

FINDINGS: Cardiovascular: Ascending aorta measures 4.1 cm. Heart is at the
upper limits of normal in size to mildly enlarged. Left ventricle
appears dilated. No pericardial effusion.

Mediastinum/Nodes: Mild heterogeneity of the right thyroid. No
pathologically enlarged mediastinal lymph nodes. Left hilar nodal
mass measures approximately 3.1 cm and is difficult to compare with
the prior exam due to lack of IV contrast but may be minimally
improved. Right hilar adenopathy measures up to 14 mm, again,
difficult to compare with the prior exam but favored to be grossly
stable. No axillary adenopathy. Prepericardiac lymph node is stable,
7 mm. Esophagus is grossly unremarkable.

Lungs/Pleura: Centrilobular and paraseptal emphysema. Irregular
parenchymal consolidation and retraction in the apical right upper
lobe, unchanged. Additional pleuroparenchymal scarring in the lower
right hemithorax with postoperative changes in the right lower lobe.
2.3 cm right middle lobe nodule (3/89), unchanged. Trace left
pleural fluid with adjacent subpleural scarring in the
posterolateral lower left hemithorax, unchanged. Additional scarring
in the anterolateral left lower lobe. Improved, but not resolved,
peribronchovascular nodularity in the left lower lobe, presumably
postobstructive in etiology given polypoid debris and obstruction of
the left lower lobe bronchus (3/72). Airway is otherwise
unremarkable.

Upper Abdomen: Low-density lesions seen in the liver on 05/14/2021
are not readily appreciated. Visualized portions of the liver,
gallbladder and right adrenal gland are otherwise unremarkable.
Slight nodular thickening of the left adrenal gland. Visualized
portion of the right kidney is unremarkable. Left nephrectomy.
Spleen is unremarkable. Pancreatic head/neck mass, better evaluated
on CT abdomen pelvis done the same day. Associated upstream ductal
dilatation and gland atrophy. Visualized portions of the stomach and
bowel are grossly unremarkable.

Musculoskeletal: Degenerative changes in the spine. Old posterior
right rib fracture. No worrisome lytic or sclerotic lesions.
IMPRESSION: 1. Left hilar mass may be minimally decreased in size with decreased
postobstructive pneumonitis in the left lower lobe. Post treatment
scarring in the right upper lobe, right hilar adenopathy and right
middle lobe nodule, similar.
2. Low-density lesions seen in the liver on 05/14/2021 are not
readily appreciated. Please see CT abdomen pelvis done the same day
for further details and discussion.
3. Pancreatic head/neck mass, also better evaluated on dedicated CT
abdomen pelvis done the same day.
4. Trace left pleural fluid, unchanged.
5. Ascending aortic aneurysm. Recommend annual imaging followup by
CTA or MRA. This recommendation follows 9060
ACCF/AHA/AATS/ACR/ASA/SCA/STRICKER/AMBIKA/AUJLA/HEINRICH Guidelines for the
Diagnosis and Management of Patients with Thoracic Aortic Disease.
Circulation. 9060; 121: [id number]. Aortic aneurysm NOS
([id number].2).
6.  Emphysema ([id number].7).

## 2023-10-15 IMAGING — CT CT CHEST W/ CM
2 of 14 series · 10 of 46 positions shown, 16 images · IV contrast (omnipaque)
Comparison: 08/20/2021 CT chest, abdomen and pelvis.

CLINICAL DATA: Status post left nephrectomy in 9449 for renal cell
carcinoma. History of pancreatic cancer diagnosed in 0070 with bone
metastases. Ongoing oral and IV chemotherapy. Restaging.

EXAM:
CT CHEST WITH CONTRAST
CT ABDOMEN AND PELVIS WITH AND WITHOUT CONTRAST
TECHNIQUE: Multidetector CT imaging of the chest was performed during
intravenous contrast administration. Multidetector CT imaging of the
abdomen and pelvis was performed following the standard protocol
before and during bolus administration of intravenous contrast.
CONTRAST:  75mL OMNIPAQUE IOHEXOL 350 MG/ML SOLN

[Series 10: axial nephro · axial · 0.77mm/px · z∈[-624,-78]mm · 8 of 235 slices shown, 13 images]
[im 27/235  soft-tissue]
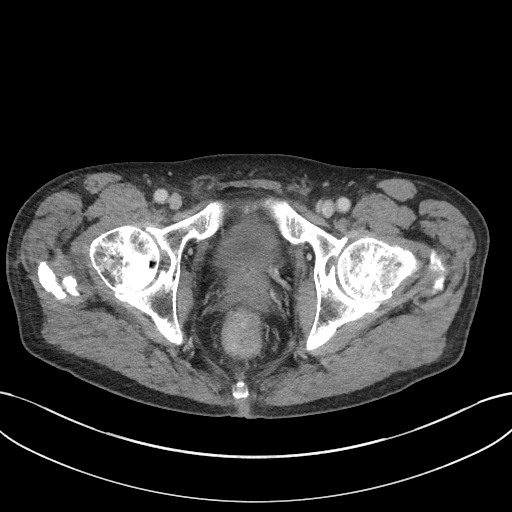
[im 27/235  bone]
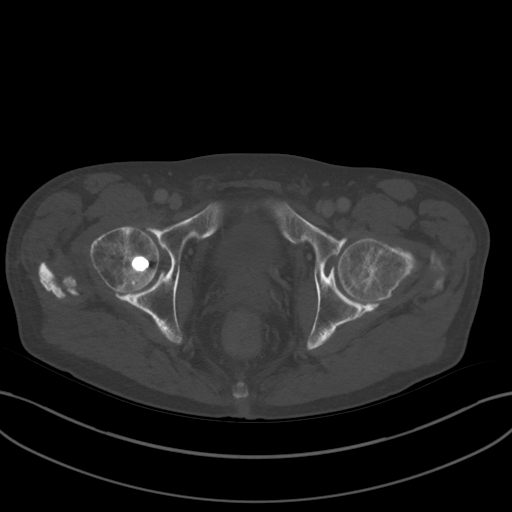
[im 53/235  soft-tissue]
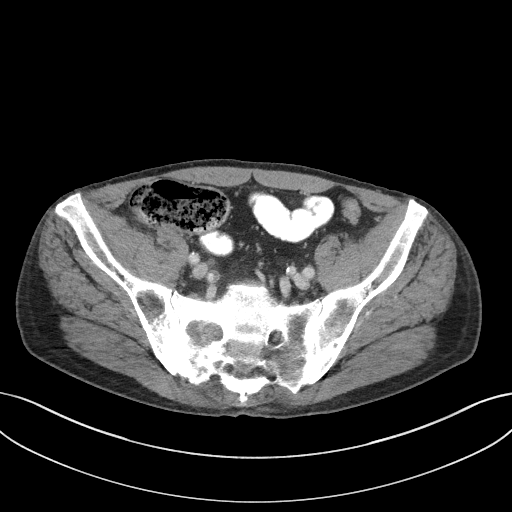
[im 79/235  soft-tissue]
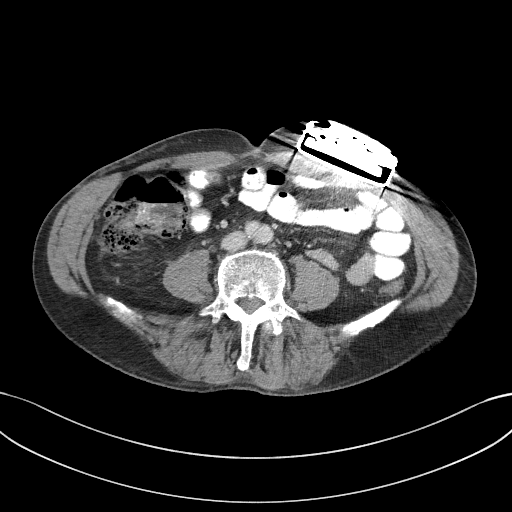
[im 105/235  soft-tissue]
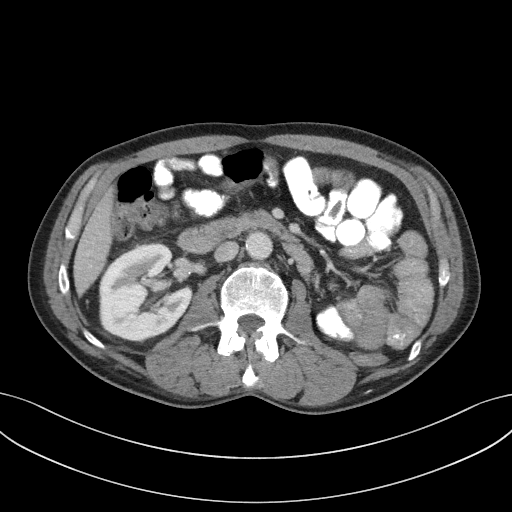
[im 131/235  soft-tissue]
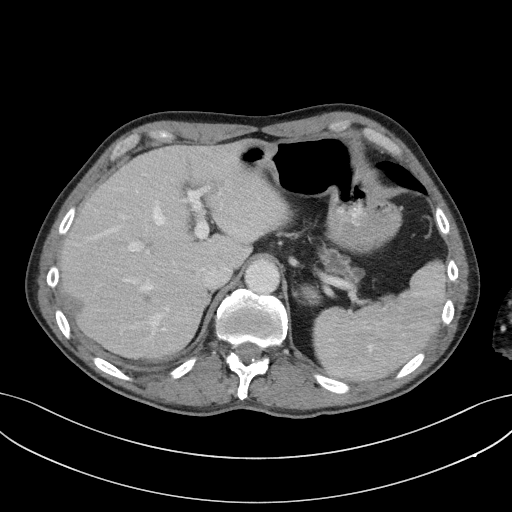
[im 131/235  lung]
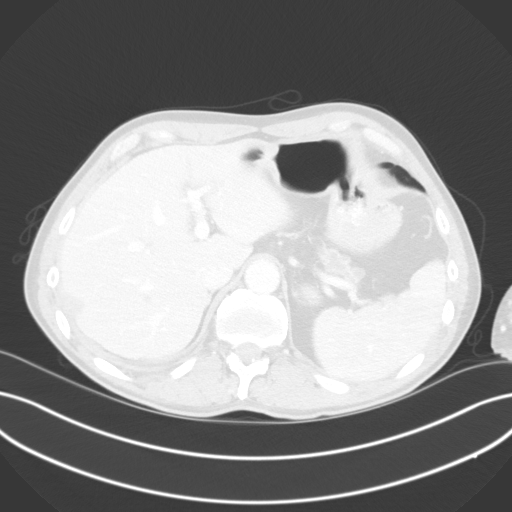
[im 157/235  soft-tissue]
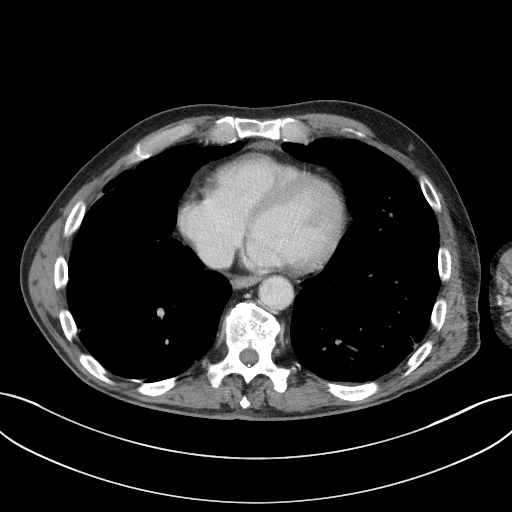
[im 157/235  lung]
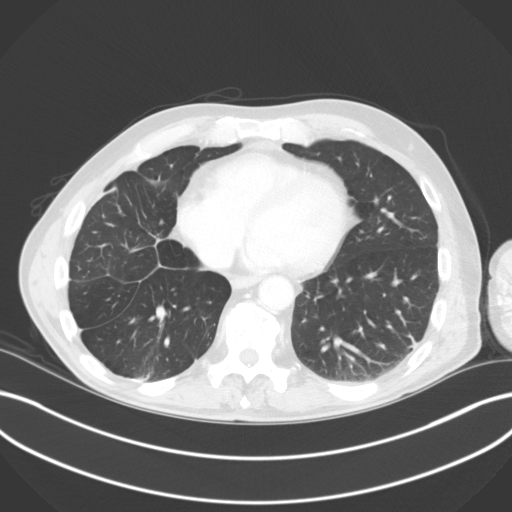
[im 183/235  soft-tissue]
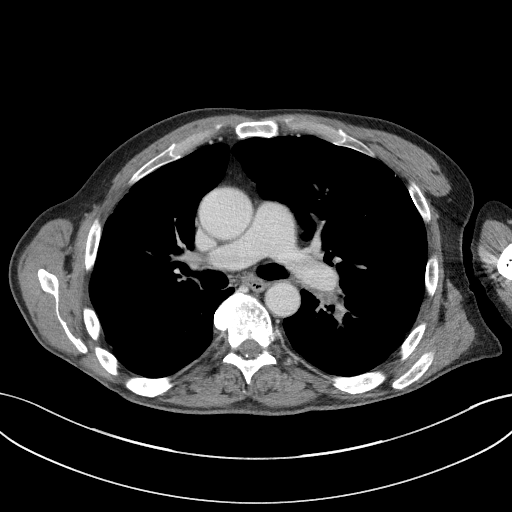
[im 183/235  lung]
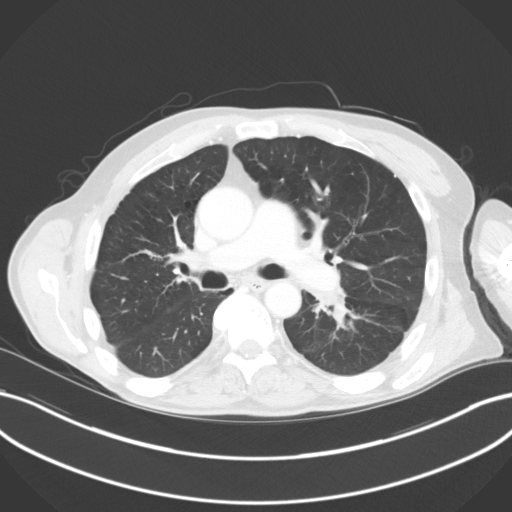
[im 209/235  soft-tissue]
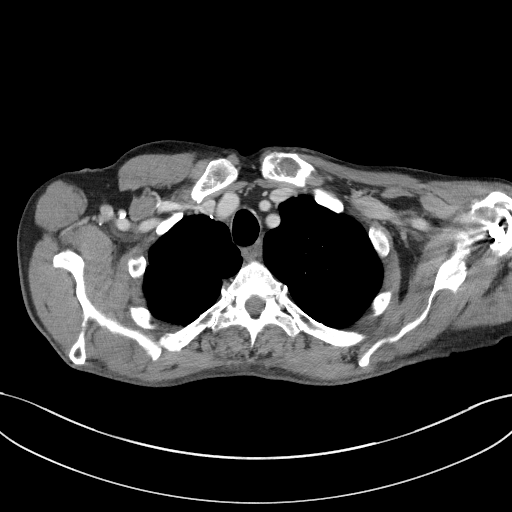
[im 209/235  lung]
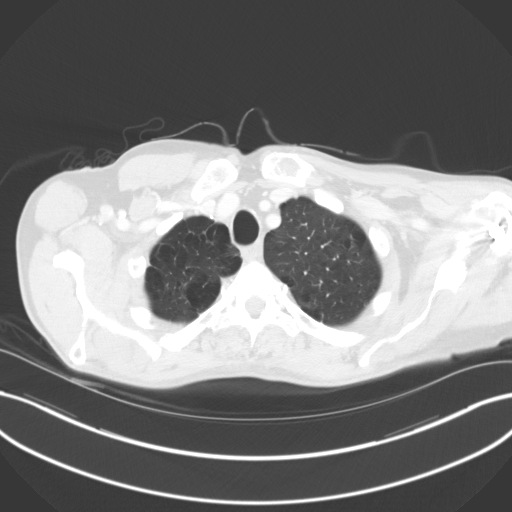

[Series 602: <mpr thick range> · coronal · 0.74mm/px · 2 of 125 slices shown, 3 images]
[im 42/125  soft-tissue]
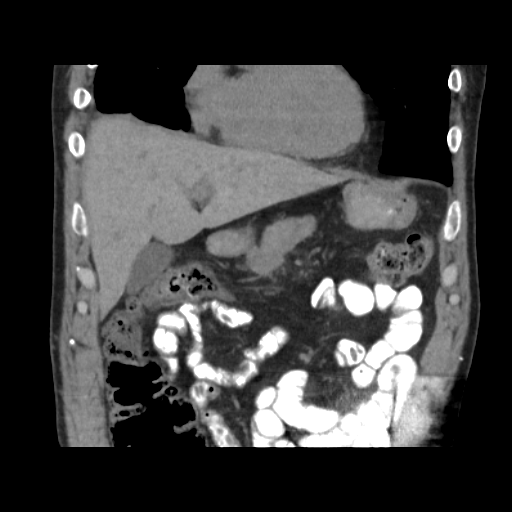
[im 42/125  bone]
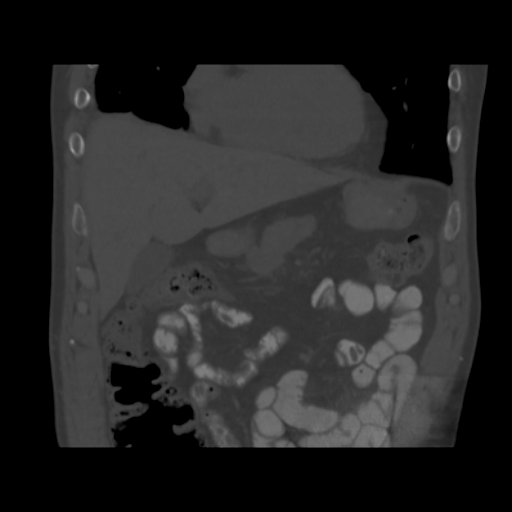
[im 83/125  soft-tissue]
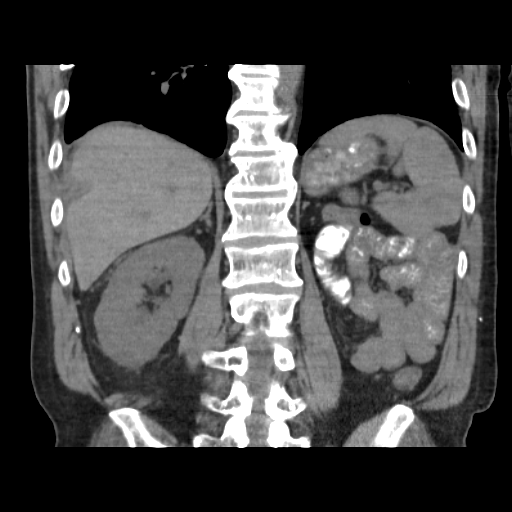

[10 of 46 positions shown; findings below may reference images not displayed]

FINDINGS: CT CHEST FINDINGS

Cardiovascular: Normal heart size. Stable trace anterior pericardial
effusion/thickening. Left anterior descending coronary
atherosclerosis. Atherosclerotic thoracic aorta with stable dilated
4.4 cm ascending thoracic aorta. Stable dilated main pulmonary
artery (3.5 cm diameter). No central pulmonary emboli.

Mediastinum/Nodes: No discrete thyroid nodules. Unremarkable
esophagus. No pathologically enlarged axillary lymph nodes. Rounded
0.5 cm right pericardiophrenic node (series 10/image 93) is
decreased from 0.8 cm. Left hilar 1.1 cm enlarged node (series
10/image 54), decreased from 1.4 cm.

Lungs/Pleura: No pneumothorax. Stable trace dependent left pleural
effusion. No right pleural effusion. Moderate centrilobular and
paraseptal emphysema with mild diffuse bronchial wall thickening.
Irregular right perihilar 2.1 x 1.5 cm solid pulmonary nodule
(series 14/image 99), previously 2.1 x 1.6 cm, stable. Surgical
suture line noted in the right lower lobe. Sharply marginated
bandlike consolidation in the posterior right upper lobe with
associated volume loss and distortion, unchanged, compatible with
radiation fibrosis. No acute consolidative airspace disease or new
significant pulmonary nodules.

Musculoskeletal: Partially visualized fixation hardware in the
proximal left humerus with underlying lytic destructive proximal
left humeral metaphyseal lesion and chronic pathologic fracture. No
new focal osseous lesions in the chest. Moderate thoracic
spondylosis. Symmetric mild bilateral gynecomastia, unchanged.

CT ABDOMEN AND PELVIS FINDINGS

Hepatobiliary: Normal liver size. Indistinct hypodense 0.5 cm
segment 7 right liver lesion (series 10/image 105), stable.
Hypodense 0.3 cm peripheral right liver lesion (series 10/image
106), stable. Previously visualized numerous hypervascular liver
lesions scattered throughout the liver on 08/20/2021 CT study have
resolved/are not apparent on today's scan. No new liver lesions.
Normal gallbladder with no radiopaque cholelithiasis. No biliary
ductal dilatation.

Pancreas: Heterogeneously enhancing pancreatic body 3.6 x 1.9 cm
mass (series 10/image 116) invading the central mesentery, mildly
decreased from 3.8 x 2.1 cm. Stable pancreatic duct dilation (8 mm
diameter) and parenchymal atrophy in the pancreatic tail.

Spleen: Normal size. No mass.

Adrenals/Urinary Tract: No discrete adrenal nodules. Left
nephrectomy. No mass or fluid collection in the left nephrectomy
bed. Compensatory hypertrophy of the right kidney. No right
hydronephrosis. No right renal stones. Scattered sub 5 mm hypodense
renal cortical lesions in the right kidney are too small to
characterize. Normal bladder.

Stomach/Bowel: Normal non-distended stomach. Normal caliber small
bowel with no small bowel wall thickening. Normal appendix. Oral
contrast transits to the right colon. Normal large bowel with no
diverticulosis, large bowel wall thickening or pericolonic fat
stranding.

Vascular/Lymphatic: Atherosclerotic nonaneurysmal abdominal aorta.
Patent portal, splenic, hepatic and right renal veins. No
pathologically enlarged lymph nodes in the abdomen or pelvis.

Reproductive: Normal size prostate.

Other: No pneumoperitoneum, ascites or focal fluid collection.
Subcutaneous spinal stimulator in the ventral left abdominal wall
with lead terminating in the posterior upper thoracic spinal canal.

Musculoskeletal: Mixed lytic and sclerotic bony lesion replacing the
upper right sacral ala measuring up to 6.1 cm (series 10/image 181),
not appreciably changed. Partially visualized fixation hardware in
the proximal right femur. Stable minimally expansile predominantly
lytic medial inferior right pubic ramus 2.1 cm lesion. No new focal
osseous lesions in the abdomen or pelvis. Mild lumbar spondylosis.
IMPRESSION: 1. No new or progressive metastatic disease.
2. Primary pancreatic body neoplasm is mildly decreased in size.
3. Previously visualized hypervascular liver lesions have resolved.
Tiny low-attenuation right liver lesions are stable.
4. Stable right perihilar pulmonary nodule. Stable radiation
fibrosis in the posterior right upper lobe.
5. Mild left hilar and right pericardiophrenic adenopathy is
decreased.
6. Stable right sacral, right inferior pubic ramus and left proximal
humeral osseous metastases.
7. Aortic Atherosclerosis ([id number]) and Emphysema ([id number]).

## 2024-01-18 IMAGING — CT CT ABD-PELV W/ CM
2 of 5 series · 12 of 46 positions shown, 14 images · IV contrast (agent unspecified)
Comparison: 12/11/2021 and prior chest, abdomen and pelvis CT
scans.

CLINICAL DATA: Patient c/o weakness, nausea, productive cough, and
constipation. Hx kidney cancer mets.



[Series 7: axial st · axial · 0.71mm/px · z∈[-576,-141]mm · 9 of 101 slices shown, 11 images]
[im 7/101  soft-tissue]
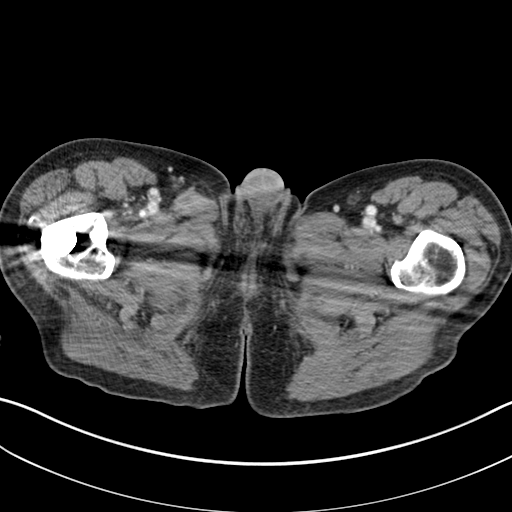
[im 7/101  bone]
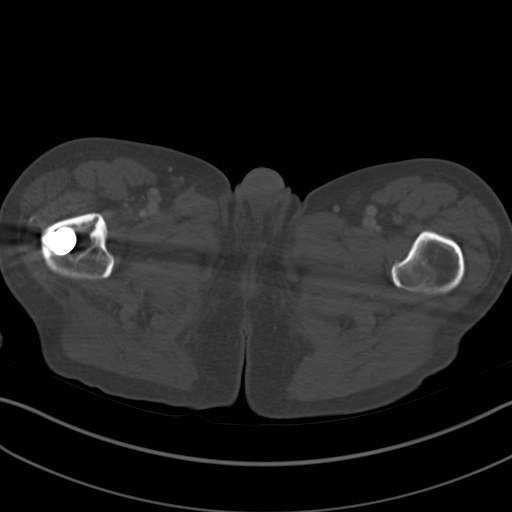
[im 19/101  soft-tissue]
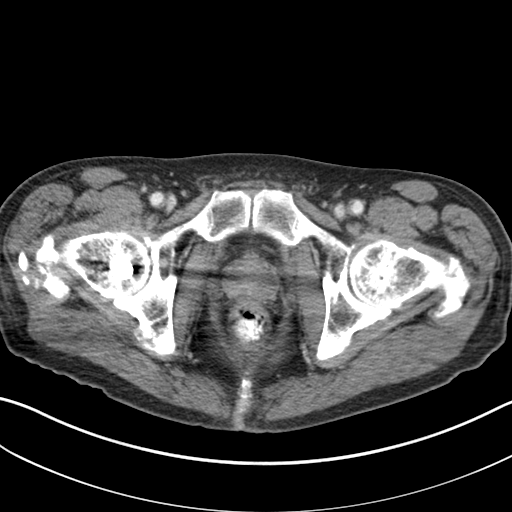
[im 32/101  soft-tissue]
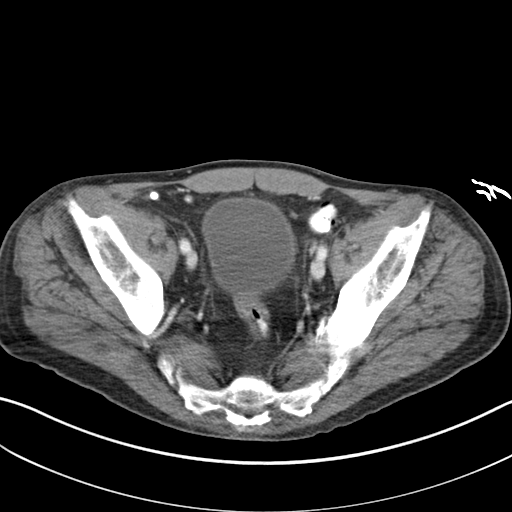
[im 38/101  soft-tissue]
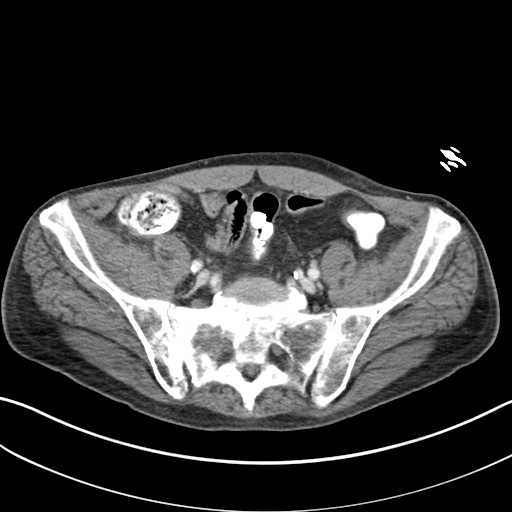
[im 51/101  soft-tissue]
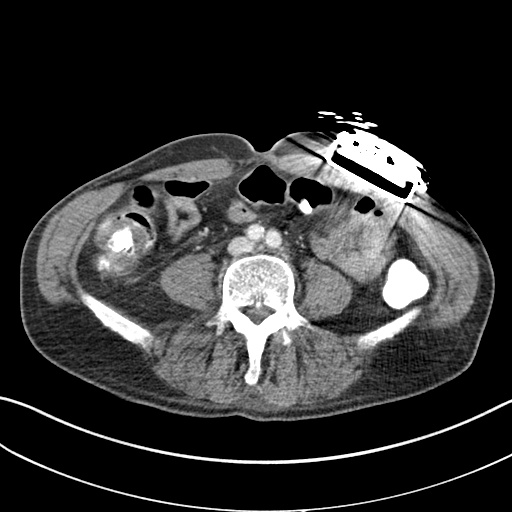
[im 63/101  soft-tissue]
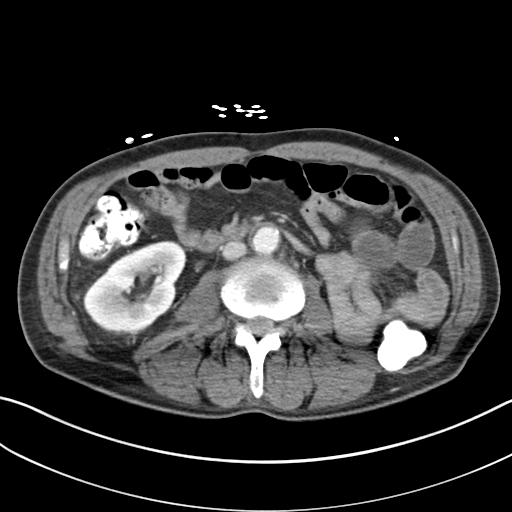
[im 69/101  soft-tissue]
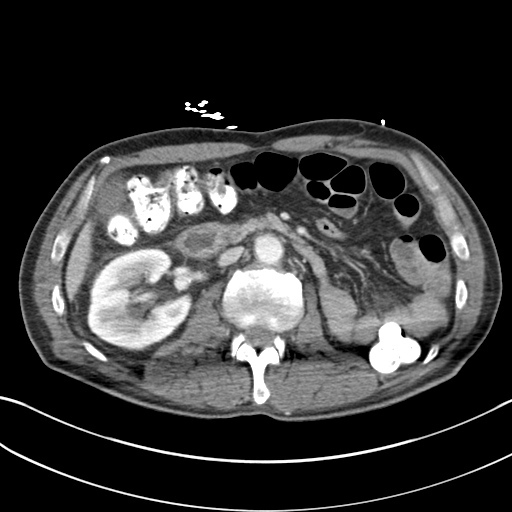
[im 82/101  soft-tissue]
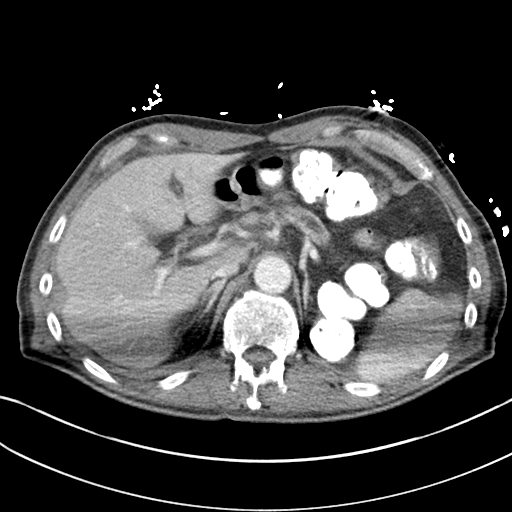
[im 94/101  soft-tissue]
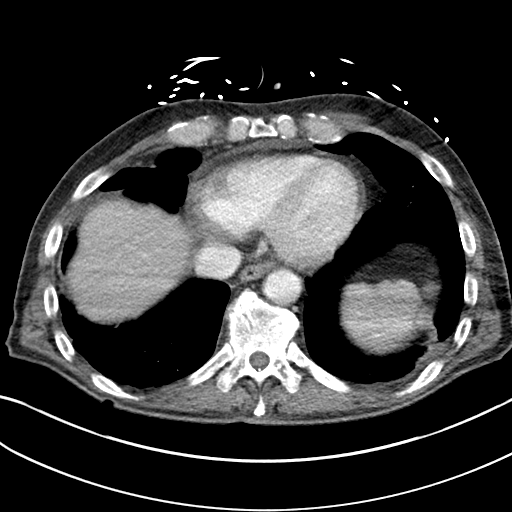
[im 94/101  bone]
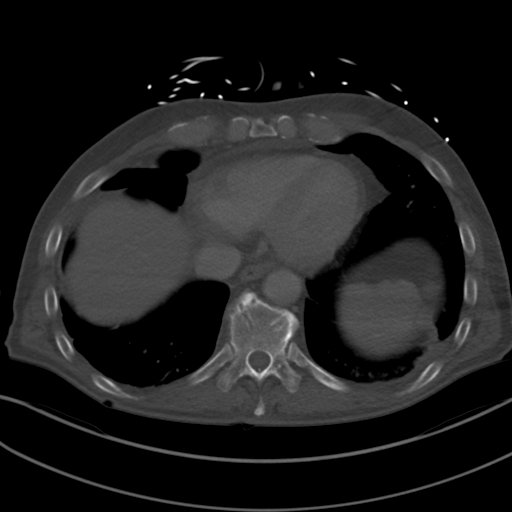

[Series 9: coronal st · coronal · 0.77mm/px · 3 of 89 slices shown]
[im 30/89  soft-tissue]
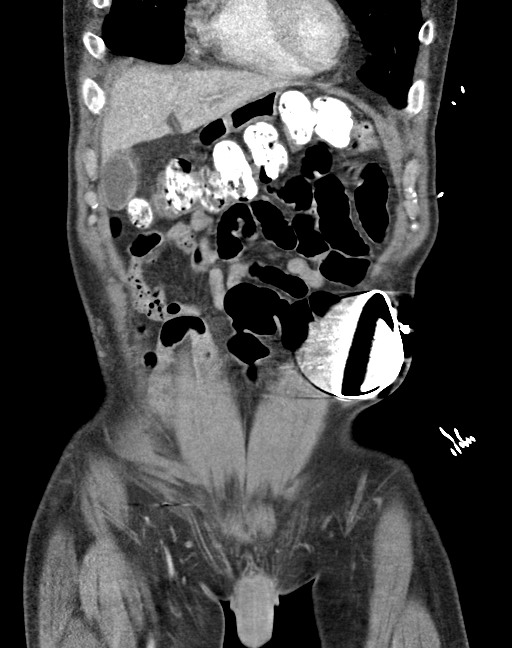
[im 40/89  soft-tissue]
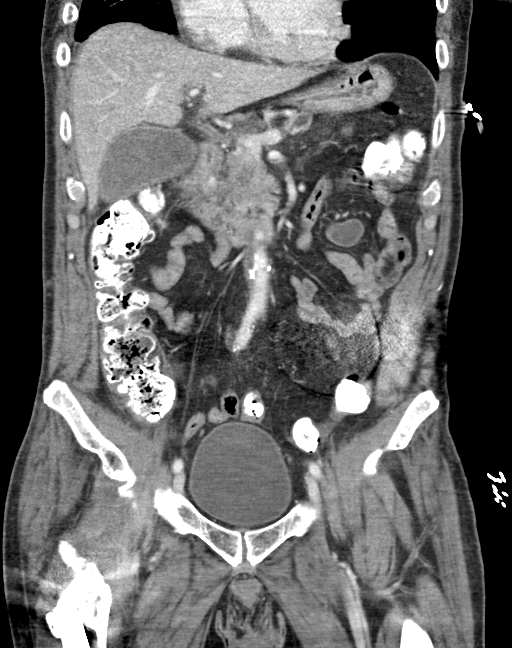
[im 49/89  soft-tissue]
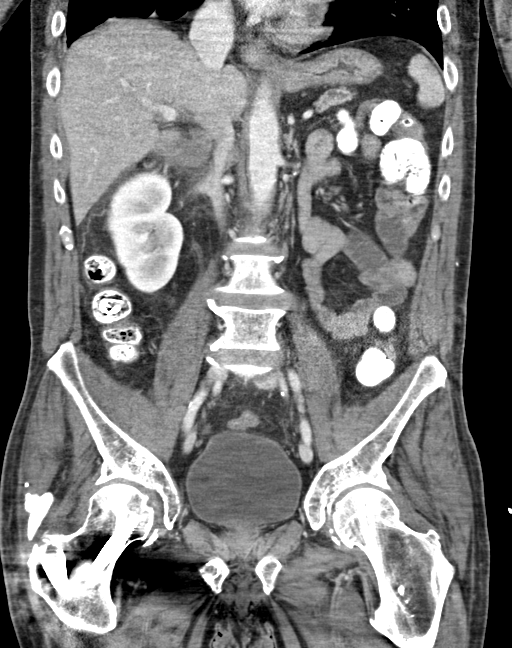

[12 of 46 positions shown; findings below may reference images not displayed]

RADIATION DOSE REDUCTION: This exam was performed according to the
departmental dose-optimization program which includes automated
exposure control, adjustment of the mA and/or kV according to
patient size and/or use of iterative reconstruction technique.

CONTRAST:  100mL OMNIPAQUE IOHEXOL 350 MG/ML SOLN
FINDINGS: CTA CHEST FINDINGS

Cardiovascular: Pulmonary arteries are well opacified. There is an
eccentric filling defect in the proximal segmental branch to the
posterior basal segment of the right lower lobe. This may reflect a
pulmonary embolism, but could reflect an infrahilar lymph node
bulging against the vessel wall. There is no other evidence to
suggest a pulmonary embolism. Pulmonary arteries to the right upper
lobe are narrowed by surrounding soft tissue. This is seen to a
lesser degree in the right middle lobe.

Heart is normal in size and configuration. Small pericardial
effusion. Ascending aorta dilated to 4.1 cm. No aortic dissection
and no significant atherosclerosis. Arch branch vessels are widely
patent.

Mediastinum/Nodes: No neck base, mediastinal or hilar masses.
Increased soft tissue attenuation along the hila is consistent with
subcentimeter adenopathy. Small amount of dependent secretions in
the distal trachea at the carina and proximal mainstem bronchi.
Trachea otherwise unremarkable. Normal soft Sage Jahnke.

Lungs/Pleura: Focal opacity extends along the right upper lobe,
apical segment, bronchi in vascular structures to the posterior
pleural surface near the apex, without significant change from the
prior CT. There is associated architectural distortion consistent
with scarring.

Similar smaller area of focal opacity lies adjacent to the right
middle lobe bronchi and vessels, also with architectural distortion,
and without significant change.

Pulmonary anastomosis staple line lies along the medial right lower
lobe, unchanged. There is pleural based linear type opacity the
posterior left lower lobe, also stable. Small area of reticular
opacity consistent with scarring lies in the lateral left lower
lobe. Additional linear/reticular opacities are noted in the left
lower lobe superior segment consistent with scarring, again stable.

Stable centrilobular emphysema.

No pleural effusion or pneumothorax.

Musculoskeletal: No fracture or acute finding. No osteolytic
lesions.

Review of the MIP images confirms the above findings.

CT ABDOMEN and PELVIS FINDINGS

Hepatobiliary: Liver normal in size and attenuation. 5 mm hypo dense
lesion noted in segment 7 on the prior study is not currently
visualized. No liver masses or lesions. Gallbladder is distended. No
wall thickening or inflammation. No bile duct dilation.

Pancreas: Hyperattenuating/enhancing lesion in the pancreatic body,
with rim type enhancement, currently measures 2.0 x 1.2 x 1.8 cm,
decreased in size from the prior CT where it had measured 3.6 x
cm transversely. The exophytic lesions seen inferior to this on the
prior CT is smaller, with less enhancement and less well-defined,
measuring 8 mm, previously 14. Pancreatic duct distal to this is
dilated, unchanged. No new pancreatic masses or lesions. No
inflammation.

Spleen: Normal in size without focal abnormality.

Adrenals/Urinary Tract: No adrenal masses.

Status post left nephrectomy.  No mass in the left renal fossa.

Right kidney with compensatory hypertrophy, normal in position with
symmetric enhancement. No right renal mass, stone or hydronephrosis.
Normal right ureter. Bladder unremarkable.

Stomach/Bowel: Normal stomach. Small bowel and colon are normal in
caliber. No wall thickening. No inflammation. Normal appendix.

Vascular/Lymphatic: Aortic atherosclerosis. No aneurysm. Patent
portal and splenic veins. No enlarged lymph nodes.

Reproductive: Unremarkable.

Other: Stable left anterior lower abdomen spine stimulator. No
hernia or ascites.

Musculoskeletal: Sclerotic and lytic bone lesion, right sacral ala,
unchanged. Small mildly expansile lytic lesion along the right
inferior pubic ramus, also stable. No new bone lesions. Partly
imaged ORIF hardware for a right proximal femur fracture.

Review of the MIP images confirms the above findings.
IMPRESSION: CTA CHEST

1. Possible small eccentric segmental pulmonary embolism to the
right lower lobe, image 167, series 3, more likely an extrinsic
compression on the segmental pulmonary artery. There is no other
evidence to suggest pulmonary embolism.
2. Ascending aorta distended to 4.1 cm. This is stable from the
prior CT. Recommend annual imaging followup by CTA or MRA. This
recommendation follows 9101
ACCF/AHA/AATS/ACR/ASA/SCA/ROFEAH/COMOLE/AARTI/FUSEINI MOHAMMED Guidelines for the
Diagnosis and Management of Patients with Thoracic Aortic Disease.
Circulation. 9101; 121: [id number]. Aortic aneurysm NOS ([id number])
3. Stable areas of peribronchovascular lung opacity that may all
reflect scarring. No evidence of new or progressive metastatic
disease in the chest.
4. No acute lung abnormalities.

ABDOMEN AND PELVIS CT

1. No acute findings within the abdomen or pelvis.
2. No evidence of new or progressive metastatic disease.
3. Interval decrease in the size of the pancreatic body mass, now
measuring 2 x 1.2 cm transversely.
4. Previously noted tiny liver lesions are not visualized on the
current exam.
5. Skeletal metastatic lesions are stable.
6. Aortic atherosclerosis.

## 2024-01-18 IMAGING — DX DG CHEST 1V PORT
1 series · 1 of 1 positions shown · non-contrast
Comparison: 02/16/2021 and older studies.

CLINICAL DATA: Short of breath and weakness. Productive cough. Mid
chest discomfort.

EXAM:
PORTABLE CHEST 1 VIEW

[chest ap]
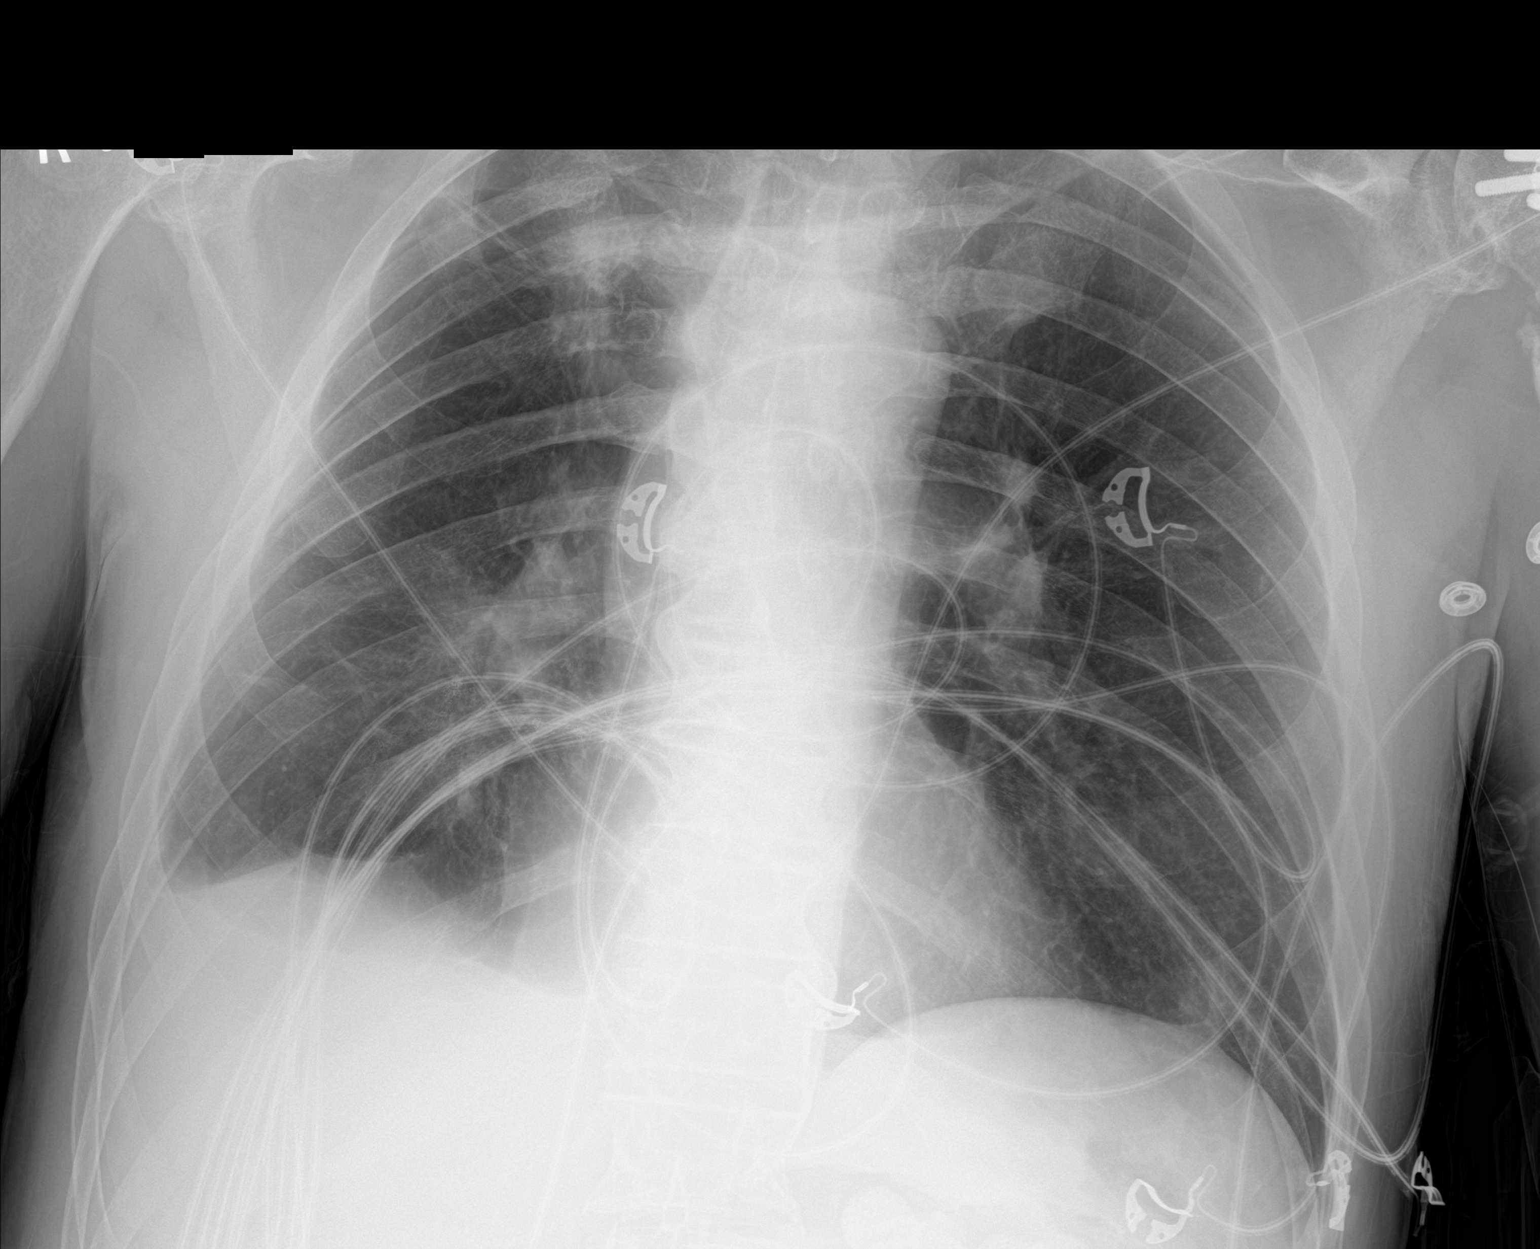

[1 of 1 positions shown; findings below may reference images not displayed]

FINDINGS: Cardiac silhouette is normal in size and configuration. No
mediastinal or hilar masses.

Focal opacity in the right upper lobe is stable consistent with
chronic scarring. Mild opacity noted along the inferior aspect of
the right hilum and medial right lung base, also consistent with
scarring is stable. Blunting of the right costophrenic sulcus is
also chronic consistent with pleural scarring.

No evidence of pneumonia or pulmonary edema. No convincing pleural
effusion and no pneumothorax.

Skeletal structures are grossly intact.
IMPRESSION: 1. No acute cardiopulmonary disease.

## 2024-01-18 IMAGING — CT CT HEAD W/O CM
3 series · 16 of 47 positions shown, 19 images · non-contrast
Comparison: CT head 04/24/2013

CLINICAL DATA: Headache



[Series 1: head wo · axial · 0.47mm/px · z∈[+214,+344]mm · 10 of 32 slices shown, 13 images]
[im 3/32  brain]
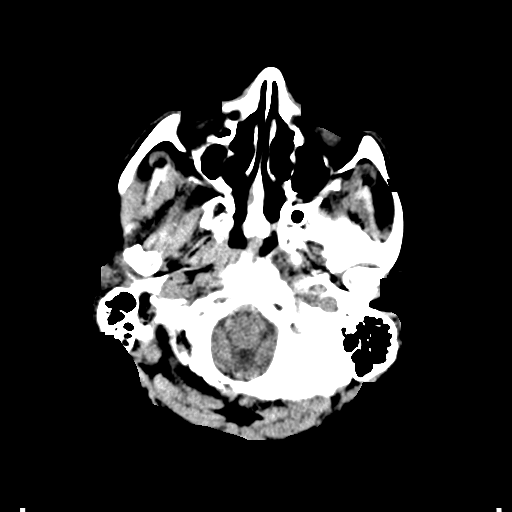
[im 3/32  bone]
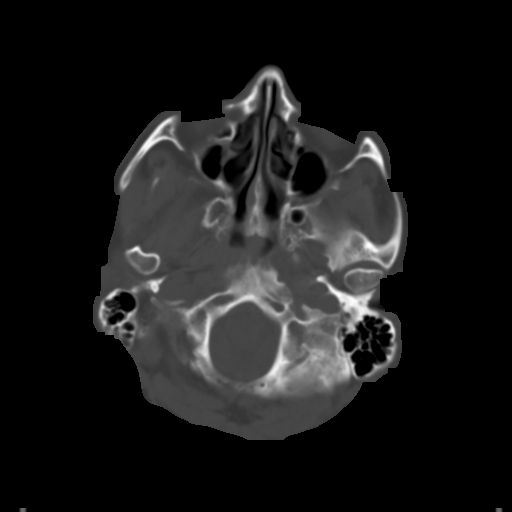
[im 6/32  brain]
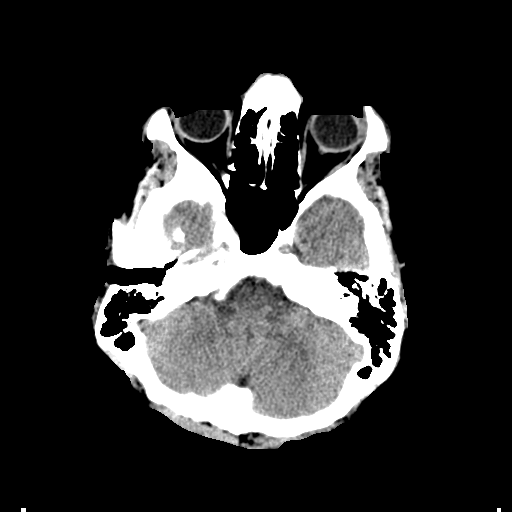
[im 9/32  brain]
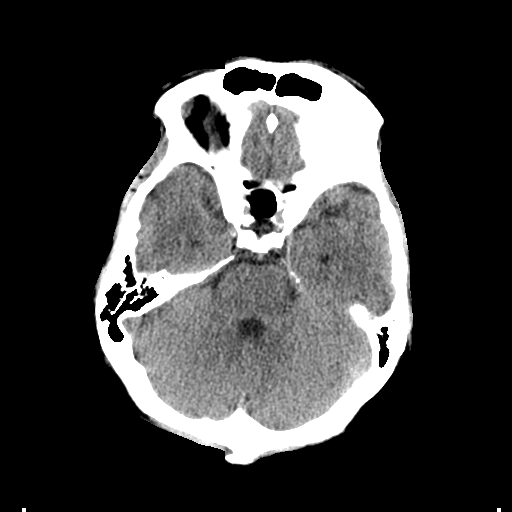
[im 11/32  brain]
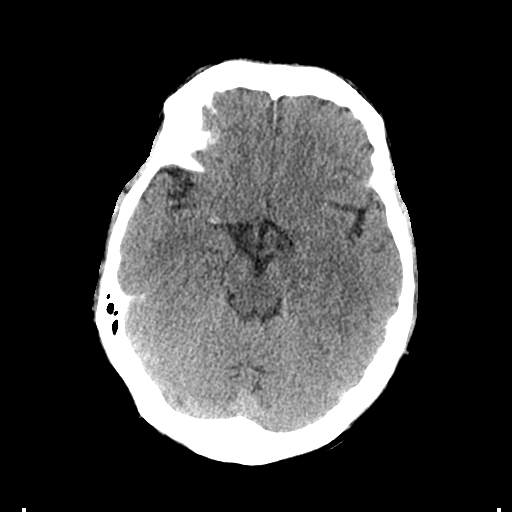
[im 14/32  brain]
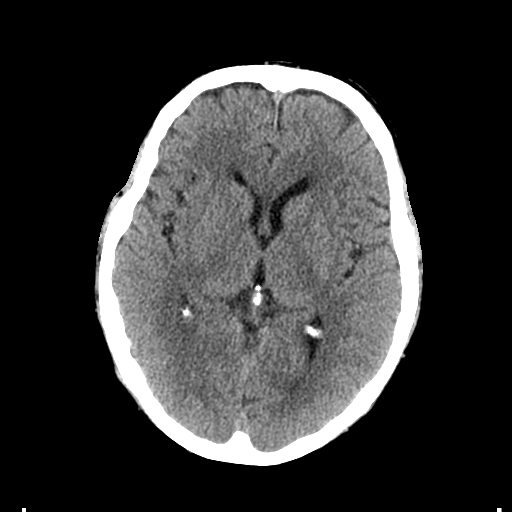
[im 14/32  bone]
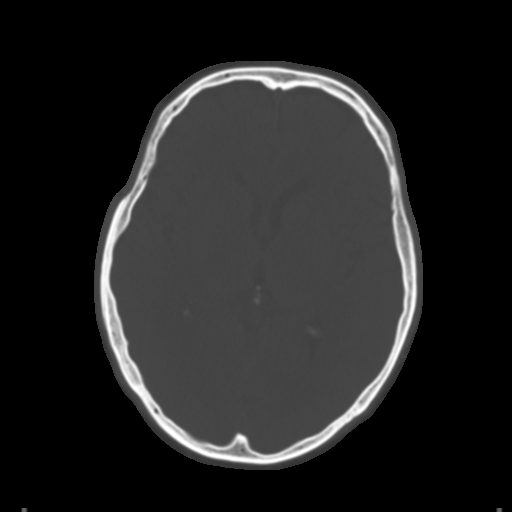
[im 18/32  brain]
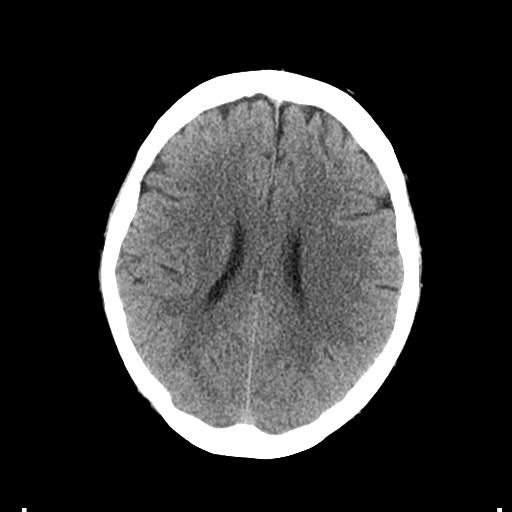
[im 21/32  brain]
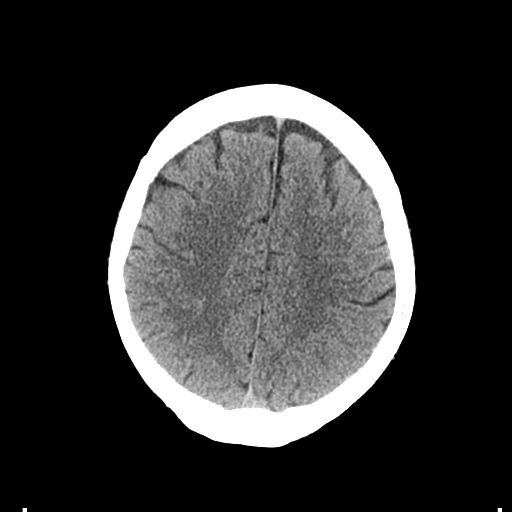
[im 24/32  brain]
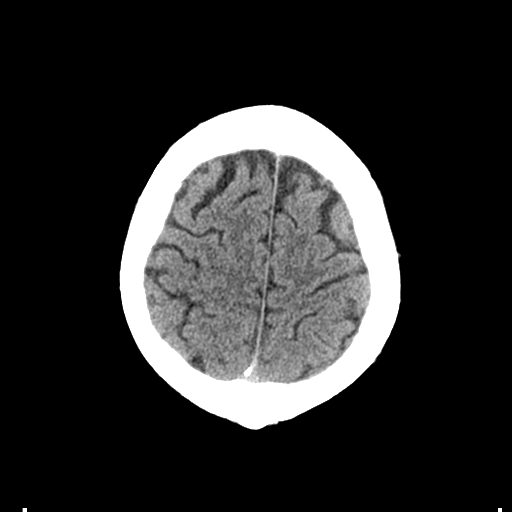
[im 26/32  brain]
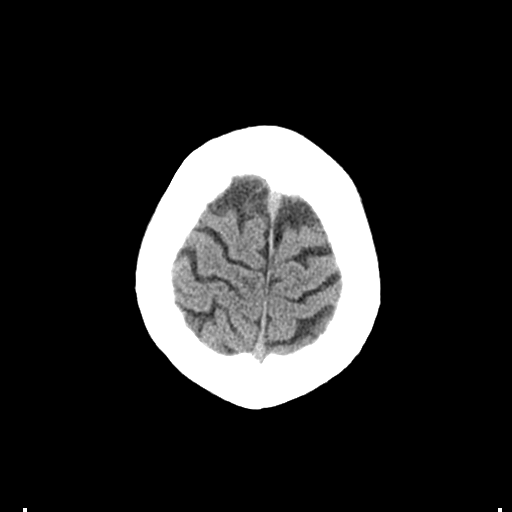
[im 26/32  bone]
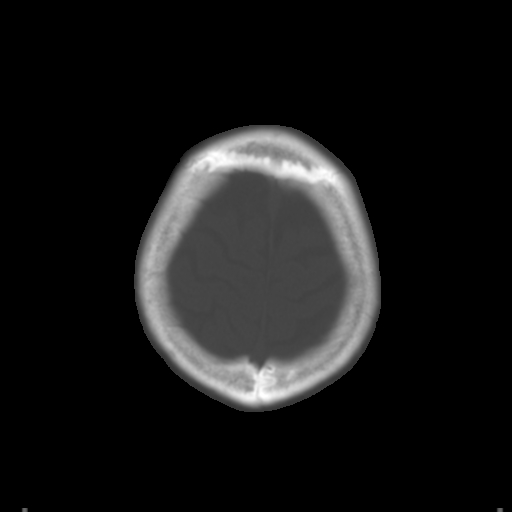
[im 29/32  brain]
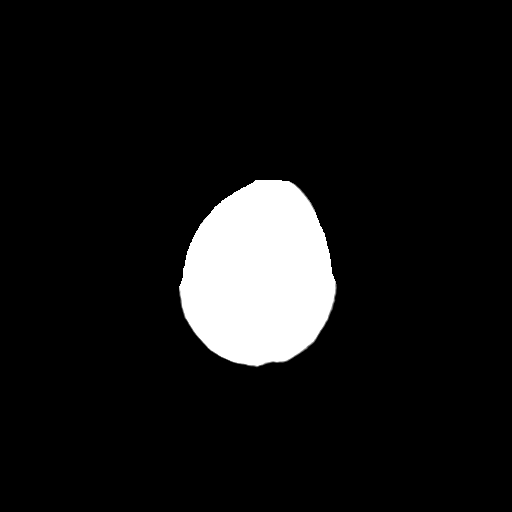

[Series 5: coronal soft tissue · coronal · 0.34mm/px · 3 of 72 slices shown]
[im 24/72  brain]
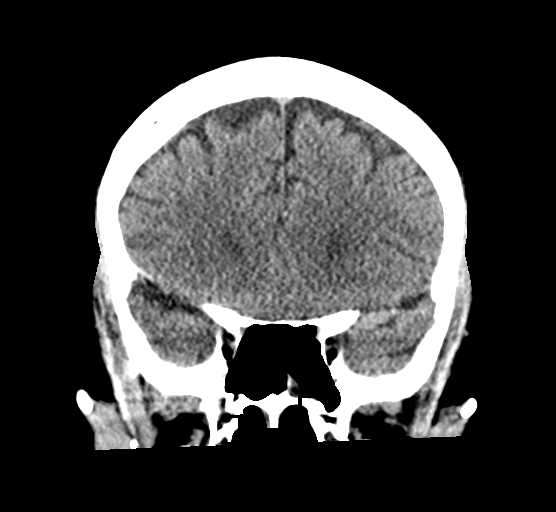
[im 32/72  brain]
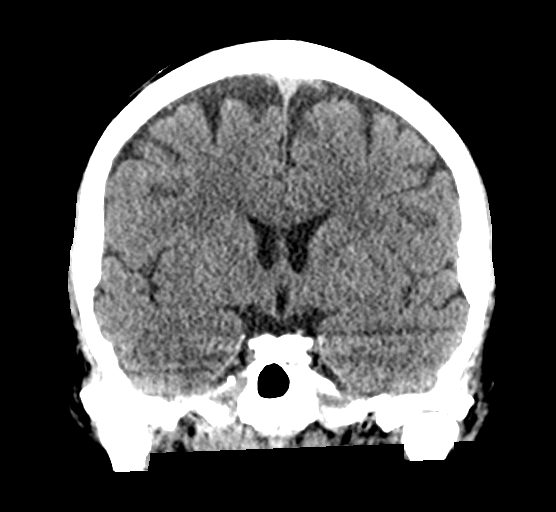
[im 40/72  brain]
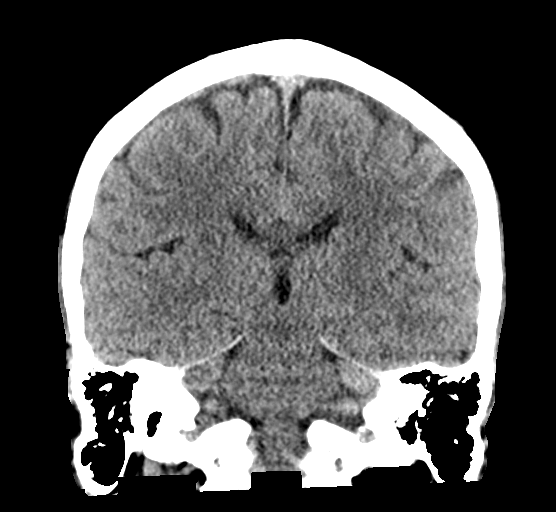

[Series 6: sagittal soft tissue · sagittal · 0.34mm/px · 3 of 64 slices shown]
[im 22/64  brain]
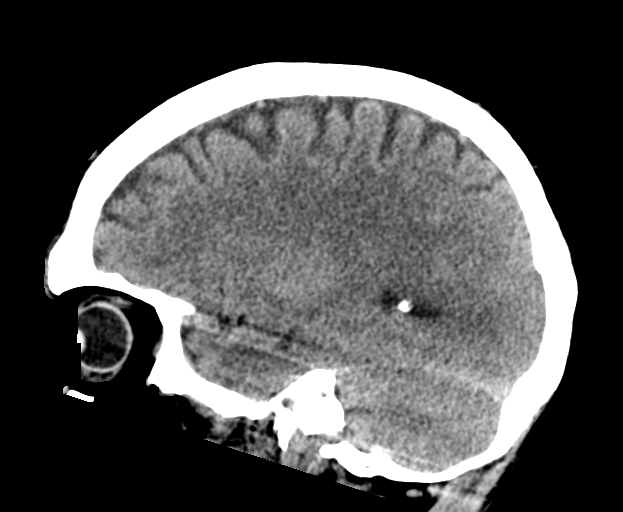
[im 32/64  brain]
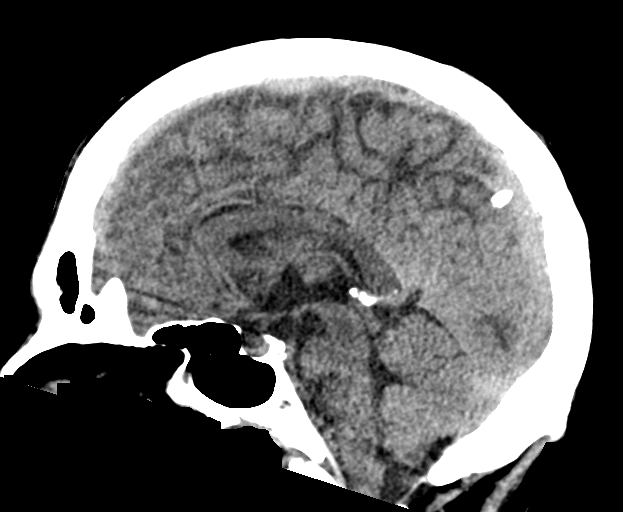
[im 43/64  brain]
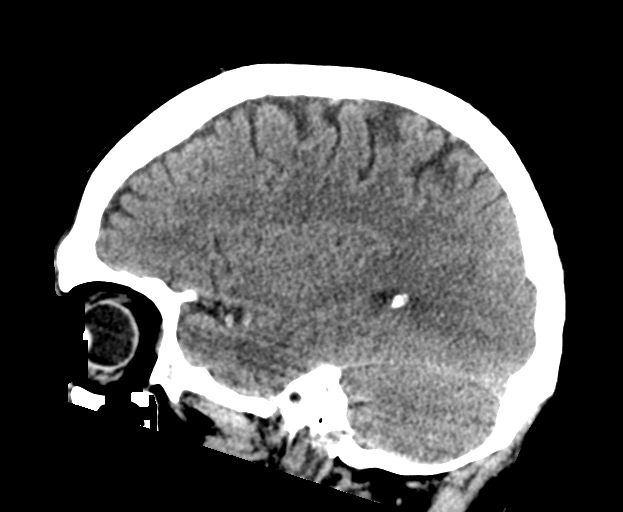

[16 of 47 positions shown; findings below may reference images not displayed]

FINDINGS: Brain: No acute intracranial hemorrhage, mass effect, or herniation.
No extra-axial fluid collections. No evidence of acute territorial
infarct. No hydrocephalus.

Vascular: No hyperdense vessel or unexpected calcification.

Skull: Normal. Negative for fracture or focal lesion.

Sinuses/Orbits: No acute finding.

Other: None.
IMPRESSION: No acute intracranial process identified.

## 2024-01-18 IMAGING — CT CT ANGIO CHEST
2 of 7 series · 15 of 36 positions shown · IV contrast (agent unspecified)
Comparison: 12/11/2021 and prior chest, abdomen and pelvis CT
scans.

CLINICAL DATA: Patient c/o weakness, nausea, productive cough, and
constipation. Hx kidney cancer mets.



[Series 3: thins · axial · 0.76mm/px · z∈[-201,+60]mm · 14 of 303 slices shown]
[im 21/303  lung]
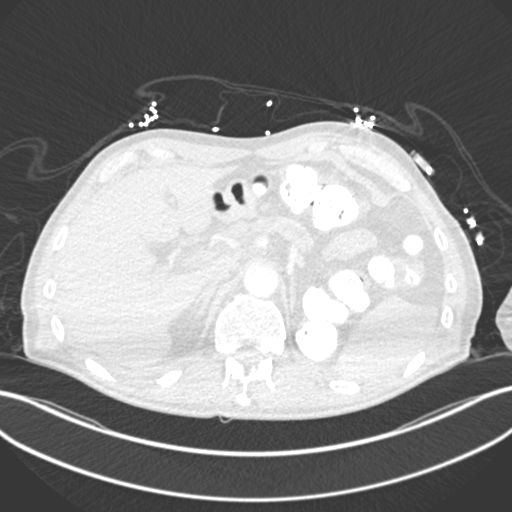
[im 41/303  mediastinal]
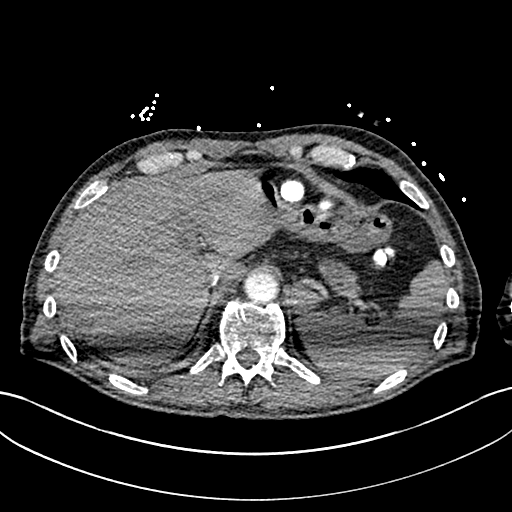
[im 61/303  lung]
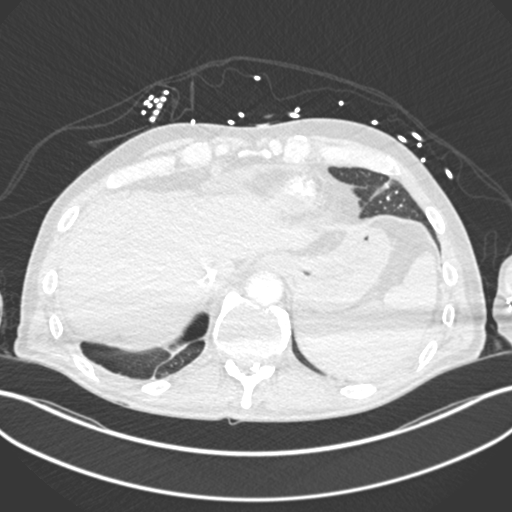
[im 81/303  mediastinal]
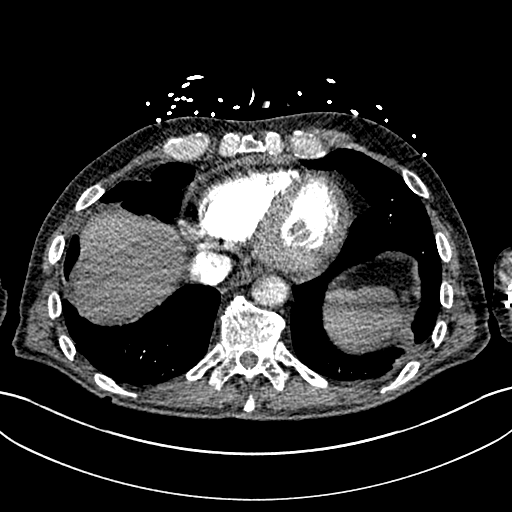
[im 101/303  lung]
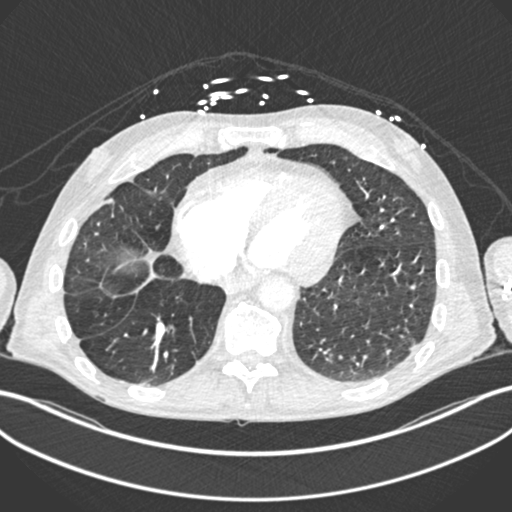
[im 121/303  mediastinal]
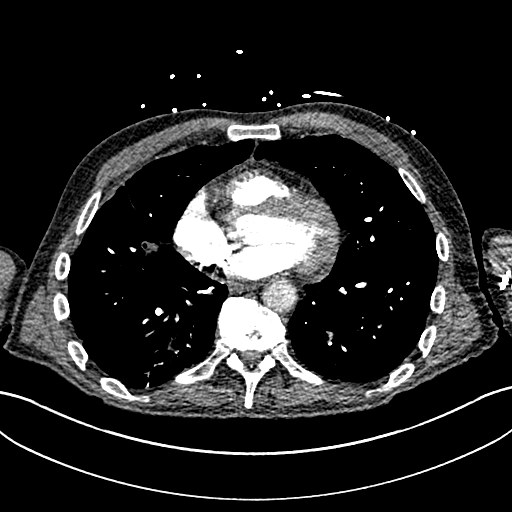
[im 141/303  lung]
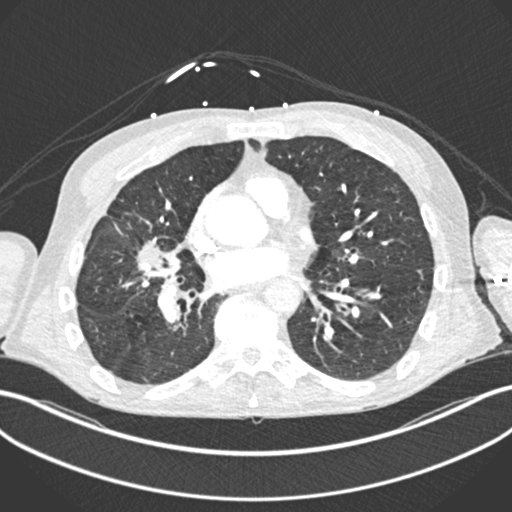
[im 162/303  mediastinal]
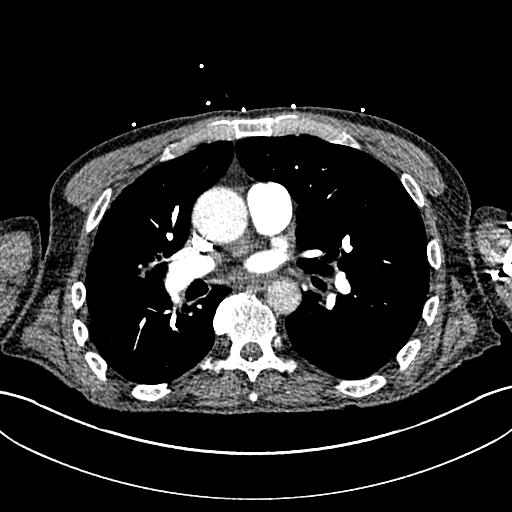
[im 182/303  lung]
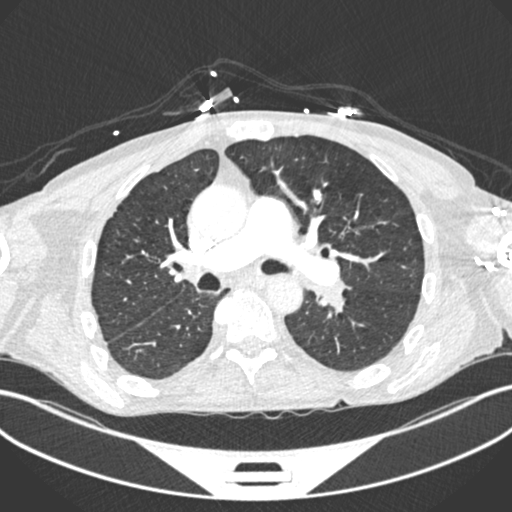
[im 202/303  mediastinal]
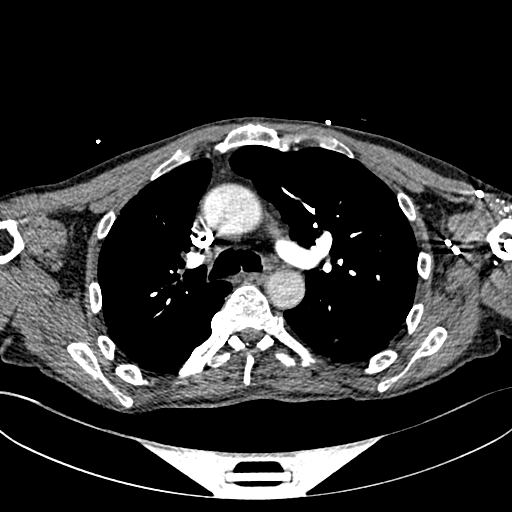
[im 222/303  lung]
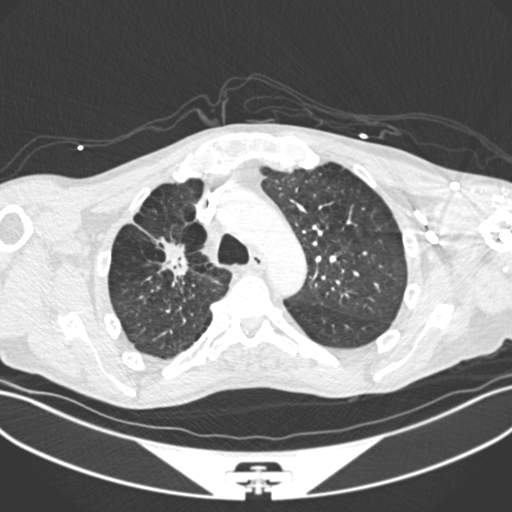
[im 242/303  mediastinal]
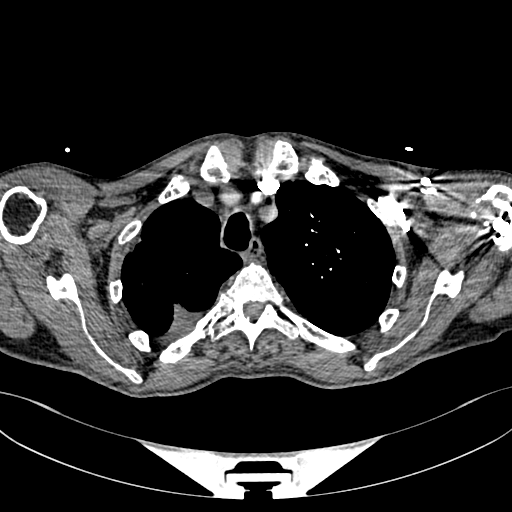
[im 262/303  lung]
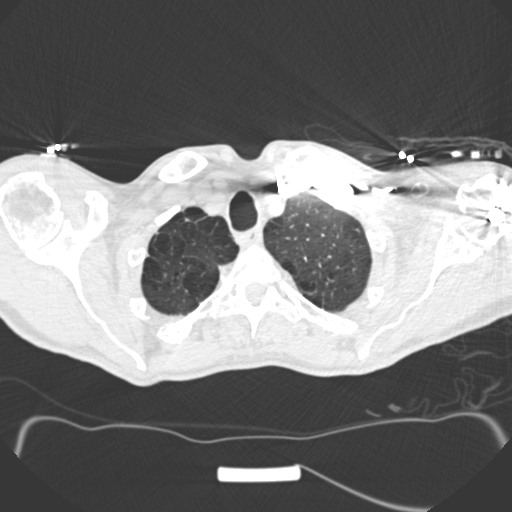
[im 282/303  mediastinal]
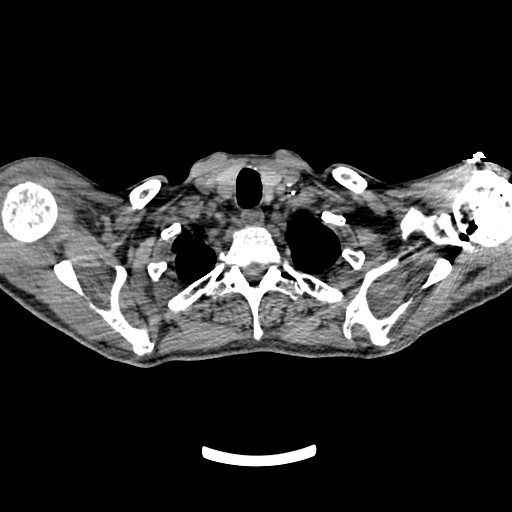

[Series 4: coronal mpr · coronal · 0.60mm/px · 1 of 126 slices shown]
[im 63/126  mediastinal]
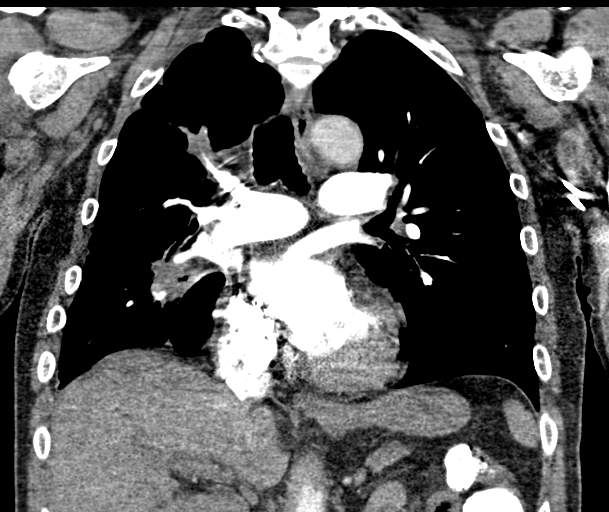

[15 of 36 positions shown; findings below may reference images not displayed]

RADIATION DOSE REDUCTION: This exam was performed according to the
departmental dose-optimization program which includes automated
exposure control, adjustment of the mA and/or kV according to
patient size and/or use of iterative reconstruction technique.

CONTRAST:  100mL OMNIPAQUE IOHEXOL 350 MG/ML SOLN
FINDINGS: CTA CHEST FINDINGS

Cardiovascular: Pulmonary arteries are well opacified. There is an
eccentric filling defect in the proximal segmental branch to the
posterior basal segment of the right lower lobe. This may reflect a
pulmonary embolism, but could reflect an infrahilar lymph node
bulging against the vessel wall. There is no other evidence to
suggest a pulmonary embolism. Pulmonary arteries to the right upper
lobe are narrowed by surrounding soft tissue. This is seen to a
lesser degree in the right middle lobe.

Heart is normal in size and configuration. Small pericardial
effusion. Ascending aorta dilated to 4.1 cm. No aortic dissection
and no significant atherosclerosis. Arch branch vessels are widely
patent.

Mediastinum/Nodes: No neck base, mediastinal or hilar masses.
Increased soft tissue attenuation along the hila is consistent with
subcentimeter adenopathy. Small amount of dependent secretions in
the distal trachea at the carina and proximal mainstem bronchi.
Trachea otherwise unremarkable. Normal soft Sage Jahnke.

Lungs/Pleura: Focal opacity extends along the right upper lobe,
apical segment, bronchi in vascular structures to the posterior
pleural surface near the apex, without significant change from the
prior CT. There is associated architectural distortion consistent
with scarring.

Similar smaller area of focal opacity lies adjacent to the right
middle lobe bronchi and vessels, also with architectural distortion,
and without significant change.

Pulmonary anastomosis staple line lies along the medial right lower
lobe, unchanged. There is pleural based linear type opacity the
posterior left lower lobe, also stable. Small area of reticular
opacity consistent with scarring lies in the lateral left lower
lobe. Additional linear/reticular opacities are noted in the left
lower lobe superior segment consistent with scarring, again stable.

Stable centrilobular emphysema.

No pleural effusion or pneumothorax.

Musculoskeletal: No fracture or acute finding. No osteolytic
lesions.

Review of the MIP images confirms the above findings.

CT ABDOMEN and PELVIS FINDINGS

Hepatobiliary: Liver normal in size and attenuation. 5 mm hypo dense
lesion noted in segment 7 on the prior study is not currently
visualized. No liver masses or lesions. Gallbladder is distended. No
wall thickening or inflammation. No bile duct dilation.

Pancreas: Hyperattenuating/enhancing lesion in the pancreatic body,
with rim type enhancement, currently measures 2.0 x 1.2 x 1.8 cm,
decreased in size from the prior CT where it had measured 3.6 x
cm transversely. The exophytic lesions seen inferior to this on the
prior CT is smaller, with less enhancement and less well-defined,
measuring 8 mm, previously 14. Pancreatic duct distal to this is
dilated, unchanged. No new pancreatic masses or lesions. No
inflammation.

Spleen: Normal in size without focal abnormality.

Adrenals/Urinary Tract: No adrenal masses.

Status post left nephrectomy.  No mass in the left renal fossa.

Right kidney with compensatory hypertrophy, normal in position with
symmetric enhancement. No right renal mass, stone or hydronephrosis.
Normal right ureter. Bladder unremarkable.

Stomach/Bowel: Normal stomach. Small bowel and colon are normal in
caliber. No wall thickening. No inflammation. Normal appendix.

Vascular/Lymphatic: Aortic atherosclerosis. No aneurysm. Patent
portal and splenic veins. No enlarged lymph nodes.

Reproductive: Unremarkable.

Other: Stable left anterior lower abdomen spine stimulator. No
hernia or ascites.

Musculoskeletal: Sclerotic and lytic bone lesion, right sacral ala,
unchanged. Small mildly expansile lytic lesion along the right
inferior pubic ramus, also stable. No new bone lesions. Partly
imaged ORIF hardware for a right proximal femur fracture.

Review of the MIP images confirms the above findings.
IMPRESSION: CTA CHEST

1. Possible small eccentric segmental pulmonary embolism to the
right lower lobe, image 167, series 3, more likely an extrinsic
compression on the segmental pulmonary artery. There is no other
evidence to suggest pulmonary embolism.
2. Ascending aorta distended to 4.1 cm. This is stable from the
prior CT. Recommend annual imaging followup by CTA or MRA. This
recommendation follows 9101
ACCF/AHA/AATS/ACR/ASA/SCA/ROFEAH/COMOLE/AARTI/FUSEINI MOHAMMED Guidelines for the
Diagnosis and Management of Patients with Thoracic Aortic Disease.
Circulation. 9101; 121: [id number]. Aortic aneurysm NOS ([id number].D)
3. Stable areas of peribronchovascular lung opacity that may all
reflect scarring. No evidence of new or progressive metastatic
disease in the chest.
4. No acute lung abnormalities.

ABDOMEN AND PELVIS CT

1. No acute findings within the abdomen or pelvis.
2. No evidence of new or progressive metastatic disease.
3. Interval decrease in the size of the pancreatic body mass, now
measuring 2 x 1.2 cm transversely.
4. Previously noted tiny liver lesions are not visualized on the
current exam.
5. Skeletal metastatic lesions are stable.
6. Aortic atherosclerosis.
